# Patient Record
Sex: Female | Born: 1940 | Race: White | Hispanic: No | Marital: Married | Smoking: Never smoker
Health system: Southern US, Community
[De-identification: ages and names within clinical notes are randomized; demographics above are authoritative.]

## PROBLEM LIST (undated history)

## (undated) DIAGNOSIS — M810 Age-related osteoporosis without current pathological fracture: Secondary | ICD-10-CM

## (undated) DIAGNOSIS — R0600 Dyspnea, unspecified: Secondary | ICD-10-CM

## (undated) DIAGNOSIS — K219 Gastro-esophageal reflux disease without esophagitis: Secondary | ICD-10-CM

## (undated) DIAGNOSIS — Z950 Presence of cardiac pacemaker: Secondary | ICD-10-CM

## (undated) DIAGNOSIS — F01B4 Vascular dementia, moderate, with anxiety: Secondary | ICD-10-CM

## (undated) DIAGNOSIS — F329 Major depressive disorder, single episode, unspecified: Secondary | ICD-10-CM

## (undated) DIAGNOSIS — M545 Low back pain, unspecified: Secondary | ICD-10-CM

## (undated) DIAGNOSIS — M5030 Other cervical disc degeneration, unspecified cervical region: Secondary | ICD-10-CM

## (undated) DIAGNOSIS — I6789 Other cerebrovascular disease: Secondary | ICD-10-CM

## (undated) DIAGNOSIS — K2970 Gastritis, unspecified, without bleeding: Secondary | ICD-10-CM

## (undated) DIAGNOSIS — F419 Anxiety disorder, unspecified: Secondary | ICD-10-CM

## (undated) DIAGNOSIS — I779 Disorder of arteries and arterioles, unspecified: Secondary | ICD-10-CM

## (undated) DIAGNOSIS — I5032 Chronic diastolic (congestive) heart failure: Secondary | ICD-10-CM

## (undated) DIAGNOSIS — E1140 Type 2 diabetes mellitus with diabetic neuropathy, unspecified: Secondary | ICD-10-CM

## (undated) DIAGNOSIS — I4891 Unspecified atrial fibrillation: Secondary | ICD-10-CM

## (undated) DIAGNOSIS — K317 Polyp of stomach and duodenum: Secondary | ICD-10-CM

## (undated) DIAGNOSIS — K760 Fatty (change of) liver, not elsewhere classified: Secondary | ICD-10-CM

## (undated) DIAGNOSIS — E039 Hypothyroidism, unspecified: Secondary | ICD-10-CM

## (undated) DIAGNOSIS — I495 Sick sinus syndrome: Secondary | ICD-10-CM

## (undated) DIAGNOSIS — I2510 Atherosclerotic heart disease of native coronary artery without angina pectoris: Secondary | ICD-10-CM

## (undated) DIAGNOSIS — I480 Paroxysmal atrial fibrillation: Secondary | ICD-10-CM

## (undated) DIAGNOSIS — I509 Heart failure, unspecified: Secondary | ICD-10-CM

## (undated) DIAGNOSIS — K56609 Unspecified intestinal obstruction, unspecified as to partial versus complete obstruction: Secondary | ICD-10-CM

## (undated) DIAGNOSIS — N184 Chronic kidney disease, stage 4 (severe): Secondary | ICD-10-CM

## (undated) DIAGNOSIS — I272 Pulmonary hypertension, unspecified: Secondary | ICD-10-CM

## (undated) DIAGNOSIS — I499 Cardiac arrhythmia, unspecified: Secondary | ICD-10-CM

## (undated) DIAGNOSIS — I10 Essential (primary) hypertension: Secondary | ICD-10-CM

## (undated) DIAGNOSIS — F32A Depression, unspecified: Secondary | ICD-10-CM

## (undated) DIAGNOSIS — Z7901 Long term (current) use of anticoagulants: Secondary | ICD-10-CM

## (undated) DIAGNOSIS — I130 Hypertensive heart and chronic kidney disease with heart failure and stage 1 through stage 4 chronic kidney disease, or unspecified chronic kidney disease: Secondary | ICD-10-CM

## (undated) DIAGNOSIS — E119 Type 2 diabetes mellitus without complications: Secondary | ICD-10-CM

## (undated) DIAGNOSIS — N1831 Chronic kidney disease, stage 3a: Secondary | ICD-10-CM

## (undated) DIAGNOSIS — M109 Gout, unspecified: Secondary | ICD-10-CM

## (undated) DIAGNOSIS — S72141A Displaced intertrochanteric fracture of right femur, initial encounter for closed fracture: Secondary | ICD-10-CM

## (undated) DIAGNOSIS — N183 Chronic kidney disease, stage 3 (moderate): Secondary | ICD-10-CM

## (undated) DIAGNOSIS — K922 Gastrointestinal hemorrhage, unspecified: Secondary | ICD-10-CM

## (undated) DIAGNOSIS — G709 Myoneural disorder, unspecified: Secondary | ICD-10-CM

## (undated) DIAGNOSIS — I639 Cerebral infarction, unspecified: Secondary | ICD-10-CM

## (undated) DIAGNOSIS — Z79899 Other long term (current) drug therapy: Secondary | ICD-10-CM

## (undated) DIAGNOSIS — M1990 Unspecified osteoarthritis, unspecified site: Secondary | ICD-10-CM

## (undated) DIAGNOSIS — I700 Atherosclerosis of aorta: Secondary | ICD-10-CM

## (undated) DIAGNOSIS — G459 Transient cerebral ischemic attack, unspecified: Secondary | ICD-10-CM

## (undated) DIAGNOSIS — K635 Polyp of colon: Secondary | ICD-10-CM

## (undated) DIAGNOSIS — D649 Anemia, unspecified: Secondary | ICD-10-CM

## (undated) HISTORY — DX: Sick sinus syndrome: I49.5

## (undated) HISTORY — PX: TUBAL LIGATION: SHX77

## (undated) HISTORY — PX: CHOLECYSTECTOMY: SHX55

## (undated) HISTORY — PX: BREAST EXCISIONAL BIOPSY: SUR124

## (undated) HISTORY — PX: REPLACEMENT TOTAL KNEE BILATERAL: SUR1225

## (undated) HISTORY — DX: Age-related osteoporosis without current pathological fracture: M81.0

## (undated) HISTORY — PX: APPENDECTOMY: SHX54

## (undated) HISTORY — DX: Gout, unspecified: M10.9

## (undated) HISTORY — PX: EYE SURGERY: SHX253

## (undated) HISTORY — PX: BACK SURGERY: SHX140

## (undated) HISTORY — DX: Unspecified atrial fibrillation: I48.91

## (undated) HISTORY — PX: OOPHORECTOMY: SHX86

---
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
## 1978-09-12 HISTORY — PX: ABDOMINAL HYSTERECTOMY: SHX81

## 1997-09-12 HISTORY — PX: COLON SURGERY: SHX602

## 1997-09-12 HISTORY — PX: OTHER SURGICAL HISTORY: SHX169

## 1998-09-12 HISTORY — PX: COLON SURGERY: SHX602

## 2007-10-20 ENCOUNTER — Other Ambulatory Visit: Payer: Self-pay

## 2007-10-20 ENCOUNTER — Emergency Department: Payer: Self-pay | Admitting: Emergency Medicine

## 2008-01-19 ENCOUNTER — Emergency Department: Payer: Self-pay | Admitting: Emergency Medicine

## 2008-01-21 ENCOUNTER — Emergency Department: Payer: Self-pay | Admitting: Emergency Medicine

## 2008-01-27 ENCOUNTER — Inpatient Hospital Stay: Payer: Self-pay | Admitting: Internal Medicine

## 2008-04-06 ENCOUNTER — Emergency Department: Payer: Self-pay | Admitting: Emergency Medicine

## 2008-05-16 ENCOUNTER — Emergency Department: Payer: Self-pay | Admitting: Emergency Medicine

## 2008-05-17 ENCOUNTER — Inpatient Hospital Stay: Payer: Self-pay | Admitting: Internal Medicine

## 2008-05-17 ENCOUNTER — Other Ambulatory Visit: Payer: Self-pay

## 2008-07-01 ENCOUNTER — Observation Stay: Payer: Self-pay | Admitting: Internal Medicine

## 2008-07-10 ENCOUNTER — Encounter: Payer: Self-pay | Admitting: Internal Medicine

## 2008-07-13 ENCOUNTER — Encounter: Payer: Self-pay | Admitting: Internal Medicine

## 2008-08-12 ENCOUNTER — Encounter: Payer: Self-pay | Admitting: Internal Medicine

## 2008-08-27 ENCOUNTER — Ambulatory Visit: Payer: Self-pay | Admitting: Urology

## 2008-09-12 ENCOUNTER — Encounter: Payer: Self-pay | Admitting: Internal Medicine

## 2008-09-16 ENCOUNTER — Ambulatory Visit: Payer: Self-pay | Admitting: Internal Medicine

## 2008-12-31 ENCOUNTER — Encounter: Payer: Self-pay | Admitting: Internal Medicine

## 2009-01-10 ENCOUNTER — Encounter: Payer: Self-pay | Admitting: Internal Medicine

## 2009-03-03 ENCOUNTER — Ambulatory Visit: Payer: Self-pay | Admitting: Gastroenterology

## 2009-06-11 ENCOUNTER — Emergency Department: Payer: Self-pay | Admitting: Emergency Medicine

## 2009-10-01 ENCOUNTER — Ambulatory Visit: Payer: Self-pay | Admitting: Internal Medicine

## 2010-01-19 ENCOUNTER — Ambulatory Visit: Payer: Self-pay

## 2010-01-27 ENCOUNTER — Encounter: Admission: RE | Admit: 2010-01-27 | Discharge: 2010-01-27 | Payer: Self-pay | Admitting: Orthopedic Surgery

## 2010-02-11 ENCOUNTER — Encounter: Admission: RE | Admit: 2010-02-11 | Discharge: 2010-02-11 | Payer: Self-pay | Admitting: Orthopedic Surgery

## 2010-03-12 ENCOUNTER — Encounter: Admission: RE | Admit: 2010-03-12 | Discharge: 2010-03-12 | Payer: Self-pay | Admitting: Orthopedic Surgery

## 2010-04-26 ENCOUNTER — Ambulatory Visit: Payer: Self-pay | Admitting: Internal Medicine

## 2010-05-22 ENCOUNTER — Emergency Department: Payer: Self-pay | Admitting: Emergency Medicine

## 2010-06-17 ENCOUNTER — Ambulatory Visit: Payer: Self-pay

## 2010-09-12 HISTORY — PX: CATARACT EXTRACTION, BILATERAL: SHX1313

## 2010-10-13 ENCOUNTER — Ambulatory Visit: Payer: Self-pay | Admitting: Internal Medicine

## 2010-10-15 ENCOUNTER — Ambulatory Visit: Payer: Self-pay | Admitting: Internal Medicine

## 2010-10-18 ENCOUNTER — Ambulatory Visit: Payer: Self-pay | Admitting: General Practice

## 2010-11-03 ENCOUNTER — Inpatient Hospital Stay: Payer: Self-pay | Admitting: General Practice

## 2010-11-08 ENCOUNTER — Encounter: Payer: Self-pay | Admitting: Internal Medicine

## 2010-11-11 ENCOUNTER — Encounter: Payer: Self-pay | Admitting: Internal Medicine

## 2010-12-07 ENCOUNTER — Encounter: Payer: Self-pay | Admitting: General Practice

## 2010-12-12 ENCOUNTER — Encounter: Payer: Self-pay | Admitting: General Practice

## 2011-05-09 ENCOUNTER — Ambulatory Visit: Payer: Self-pay | Admitting: Internal Medicine

## 2011-09-13 HISTORY — PX: SHOULDER ARTHROSCOPY WITH SUBACROMIAL DECOMPRESSION: SHX5684

## 2012-02-03 ENCOUNTER — Ambulatory Visit: Payer: Self-pay | Admitting: General Practice

## 2012-03-08 ENCOUNTER — Encounter: Payer: Self-pay | Admitting: General Practice

## 2012-03-12 ENCOUNTER — Ambulatory Visit: Payer: Self-pay | Admitting: Internal Medicine

## 2012-03-12 ENCOUNTER — Encounter: Payer: Self-pay | Admitting: General Practice

## 2012-03-28 ENCOUNTER — Ambulatory Visit: Payer: Self-pay | Admitting: General Practice

## 2012-03-28 LAB — BASIC METABOLIC PANEL
BUN: 19 mg/dL — ABNORMAL HIGH (ref 7–18)
Chloride: 108 mmol/L — ABNORMAL HIGH (ref 98–107)
EGFR (African American): 51 — ABNORMAL LOW
EGFR (Non-African Amer.): 44 — ABNORMAL LOW
Osmolality: 288 (ref 275–301)
Potassium: 4.2 mmol/L (ref 3.5–5.1)
Sodium: 143 mmol/L (ref 136–145)

## 2012-03-28 LAB — CBC WITH DIFFERENTIAL/PLATELET
Basophil %: 0.8 %
Eosinophil %: 2.3 %
HGB: 13 g/dL (ref 12.0–16.0)
Lymphocyte #: 2.5 10*3/uL (ref 1.0–3.6)
MCH: 30.1 pg (ref 26.0–34.0)
MCV: 94 fL (ref 80–100)
Monocyte #: 0.5 x10 3/mm (ref 0.2–0.9)
Neutrophil %: 50.2 %
RBC: 4.33 10*6/uL (ref 3.80–5.20)

## 2012-04-02 ENCOUNTER — Ambulatory Visit: Payer: Self-pay | Admitting: General Practice

## 2012-04-11 DIAGNOSIS — H538 Other visual disturbances: Secondary | ICD-10-CM | POA: Insufficient documentation

## 2012-04-12 ENCOUNTER — Encounter: Payer: Self-pay | Admitting: General Practice

## 2012-07-16 ENCOUNTER — Inpatient Hospital Stay: Payer: Self-pay | Admitting: Internal Medicine

## 2012-07-16 LAB — CBC
HCT: 43.2 % (ref 35.0–47.0)
HGB: 14.8 g/dL (ref 12.0–16.0)
MCH: 31.3 pg (ref 26.0–34.0)
MCHC: 34.2 g/dL (ref 32.0–36.0)
RBC: 4.71 10*6/uL (ref 3.80–5.20)
RDW: 13.1 % (ref 11.5–14.5)

## 2012-07-16 LAB — COMPREHENSIVE METABOLIC PANEL
Albumin: 3.6 g/dL (ref 3.4–5.0)
Alkaline Phosphatase: 109 U/L (ref 50–136)
BUN: 20 mg/dL — ABNORMAL HIGH (ref 7–18)
Calcium, Total: 8.8 mg/dL (ref 8.5–10.1)
Co2: 25 mmol/L (ref 21–32)
EGFR (Non-African Amer.): 45 — ABNORMAL LOW
Glucose: 71 mg/dL (ref 65–99)
Osmolality: 280 (ref 275–301)
Potassium: 4 mmol/L (ref 3.5–5.1)
SGOT(AST): 23 U/L (ref 15–37)
SGPT (ALT): 23 U/L (ref 12–78)
Sodium: 140 mmol/L (ref 136–145)

## 2012-07-16 LAB — APTT
Activated PTT: 28.7 secs (ref 23.6–35.9)
Activated PTT: >160 secs (ref 23.6–35.9)
Activated PTT: 130.2 secs — ABNORMAL HIGH (ref 23.6–35.9)

## 2012-07-16 LAB — TSH: Thyroid Stimulating Horm: 3.99 u[IU]/mL

## 2012-07-16 LAB — DIGOXIN LEVEL: Digoxin: <0.1 ng/mL — ABNORMAL LOW

## 2012-07-16 LAB — TROPONIN I: Troponin-I: <0.02 ng/mL

## 2012-07-16 LAB — CK TOTAL AND CKMB (NOT AT ARMC): CK-MB: 0.9 ng/mL (ref 0.5–3.6)

## 2012-07-17 LAB — URINALYSIS, COMPLETE
Bacteria: NONE SEEN
Bilirubin,UR: NEGATIVE
Glucose,UR: NEGATIVE mg/dL (ref 0–75)
Ketone: NEGATIVE
Nitrite: POSITIVE
Specific Gravity: 1.006 (ref 1.003–1.030)
Squamous Epithelial: 1
WBC UR: 12 /HPF (ref 0–5)

## 2012-07-17 LAB — APTT: Activated PTT: 93.6 secs — ABNORMAL HIGH (ref 23.6–35.9)

## 2012-07-18 LAB — CBC WITH DIFFERENTIAL/PLATELET
Basophil #: 0.1 10*3/uL (ref 0.0–0.1)
Basophil %: 0.8 %
Eosinophil #: 0.1 10*3/uL (ref 0.0–0.7)
HGB: 14.5 g/dL (ref 12.0–16.0)
Lymphocyte %: 16 %
MCH: 30.5 pg (ref 26.0–34.0)
MCHC: 33.2 g/dL (ref 32.0–36.0)
Monocyte #: 0.8 x10 3/mm (ref 0.2–0.9)
Neutrophil %: 73.8 %
RDW: 13.1 % (ref 11.5–14.5)

## 2012-07-18 LAB — BASIC METABOLIC PANEL
Anion Gap: 6 — ABNORMAL LOW (ref 7–16)
BUN: 24 mg/dL — ABNORMAL HIGH (ref 7–18)
Chloride: 99 mmol/L (ref 98–107)
Creatinine: 1.33 mg/dL — ABNORMAL HIGH (ref 0.60–1.30)
Glucose: 300 mg/dL — ABNORMAL HIGH (ref 65–99)
Potassium: 5.1 mmol/L (ref 3.5–5.1)
Sodium: 132 mmol/L — ABNORMAL LOW (ref 136–145)

## 2012-07-19 LAB — CBC WITH DIFFERENTIAL/PLATELET
Basophil #: 0 10*3/uL (ref 0.0–0.1)
Eosinophil #: 0.3 10*3/uL (ref 0.0–0.7)
HCT: 42.3 % (ref 35.0–47.0)
Lymphocyte #: 1.8 10*3/uL (ref 1.0–3.6)
Lymphocyte %: 22.3 %
MCHC: 34.7 g/dL (ref 32.0–36.0)
MCV: 93 fL (ref 80–100)
Neutrophil #: 5.1 10*3/uL (ref 1.4–6.5)
Platelet: 235 10*3/uL (ref 150–440)
RDW: 13 % (ref 11.5–14.5)

## 2012-07-19 LAB — DIGOXIN LEVEL: Digoxin: 1.12 ng/mL

## 2012-07-20 LAB — BASIC METABOLIC PANEL
Calcium, Total: 8 mg/dL — ABNORMAL LOW (ref 8.5–10.1)
Chloride: 105 mmol/L (ref 98–107)
Co2: 24 mmol/L (ref 21–32)
EGFR (African American): >60
EGFR (Non-African Amer.): 57 — ABNORMAL LOW
Glucose: 116 mg/dL — ABNORMAL HIGH (ref 65–99)
Osmolality: 278 (ref 275–301)
Potassium: 4.9 mmol/L (ref 3.5–5.1)
Sodium: 137 mmol/L (ref 136–145)

## 2012-09-12 HISTORY — PX: JOINT REPLACEMENT: SHX530

## 2012-09-26 ENCOUNTER — Ambulatory Visit: Payer: Self-pay | Admitting: General Practice

## 2012-09-26 LAB — BASIC METABOLIC PANEL
Anion Gap: 8 (ref 7–16)
EGFR (African American): 53 — ABNORMAL LOW
EGFR (Non-African Amer.): 46 — ABNORMAL LOW
Sodium: 141 mmol/L (ref 136–145)

## 2012-09-26 LAB — URINALYSIS, COMPLETE
Bilirubin,UR: NEGATIVE
Glucose,UR: NEGATIVE mg/dL (ref 0–75)
Ketone: NEGATIVE
Nitrite: NEGATIVE
RBC,UR: 5 /HPF (ref 0–5)

## 2012-09-26 LAB — PROTIME-INR
INR: 1.3
Prothrombin Time: 16.3 secs — ABNORMAL HIGH (ref 11.5–14.7)

## 2012-09-26 LAB — CBC
MCHC: 32.6 g/dL (ref 32.0–36.0)
Platelet: 250 10*3/uL (ref 150–440)
WBC: 6.2 10*3/uL (ref 3.6–11.0)

## 2012-09-26 LAB — APTT: Activated PTT: 31.5 secs (ref 23.6–35.9)

## 2012-10-10 ENCOUNTER — Inpatient Hospital Stay: Payer: Self-pay | Admitting: General Practice

## 2012-10-10 LAB — PROTIME-INR: INR: 0.9

## 2012-10-11 LAB — BASIC METABOLIC PANEL
Co2: 26 mmol/L (ref 21–32)
EGFR (Non-African Amer.): 43 — ABNORMAL LOW
Osmolality: 286 (ref 275–301)

## 2012-10-11 LAB — HEMOGLOBIN: HGB: 10.9 g/dL — ABNORMAL LOW (ref 12.0–16.0)

## 2012-10-12 ENCOUNTER — Encounter: Payer: Self-pay | Admitting: Internal Medicine

## 2012-10-12 LAB — BASIC METABOLIC PANEL
BUN: 19 mg/dL — ABNORMAL HIGH (ref 7–18)
Calcium, Total: 7.8 mg/dL — ABNORMAL LOW (ref 8.5–10.1)
Chloride: 105 mmol/L (ref 98–107)
Chloride: 106 mmol/L (ref 98–107)
Co2: 18 mmol/L — ABNORMAL LOW (ref 21–32)
Co2: 26 mmol/L (ref 21–32)
Creatinine: 1.5 mg/dL — ABNORMAL HIGH (ref 0.60–1.30)
EGFR (Non-African Amer.): 38 — ABNORMAL LOW
EGFR (Non-African Amer.): 34 — ABNORMAL LOW
Osmolality: 289 (ref 275–301)
Sodium: 133 mmol/L — ABNORMAL LOW (ref 136–145)

## 2012-10-12 LAB — HEMOGLOBIN: HGB: 11 g/dL — ABNORMAL LOW (ref 12.0–16.0)

## 2012-10-13 LAB — BASIC METABOLIC PANEL
BUN: 17 mg/dL (ref 7–18)
Calcium, Total: 8.6 mg/dL (ref 8.5–10.1)
Co2: 25 mmol/L (ref 21–32)
Sodium: 137 mmol/L (ref 136–145)

## 2012-10-14 ENCOUNTER — Encounter: Payer: Self-pay | Admitting: Internal Medicine

## 2012-10-16 LAB — PROTIME-INR: INR: 1.5

## 2012-10-29 ENCOUNTER — Emergency Department: Payer: Self-pay | Admitting: Emergency Medicine

## 2012-10-29 LAB — COMPREHENSIVE METABOLIC PANEL
Albumin: 3.9 g/dL (ref 3.4–5.0)
Anion Gap: 12 (ref 7–16)
Chloride: 104 mmol/L (ref 98–107)
Co2: 21 mmol/L (ref 21–32)
Osmolality: 277 (ref 275–301)
Potassium: 4.8 mmol/L (ref 3.5–5.1)
SGOT(AST): 47 U/L — ABNORMAL HIGH (ref 15–37)
SGPT (ALT): 27 U/L (ref 12–78)
Sodium: 137 mmol/L (ref 136–145)
Total Protein: 8 g/dL (ref 6.4–8.2)

## 2012-10-29 LAB — URINALYSIS, COMPLETE
Bilirubin,UR: NEGATIVE
Blood: NEGATIVE
Glucose,UR: NEGATIVE mg/dL (ref 0–75)
Leukocyte Esterase: NEGATIVE
Ph: 9 (ref 4.5–8.0)
Protein: NEGATIVE
RBC,UR: <1 /HPF (ref 0–5)
Squamous Epithelial: 4

## 2012-10-29 LAB — CBC
HCT: 38.4 % (ref 35.0–47.0)
HGB: 12.9 g/dL (ref 12.0–16.0)
MCH: 30.9 pg (ref 26.0–34.0)
MCHC: 33.6 g/dL (ref 32.0–36.0)
Platelet: 493 10*3/uL — ABNORMAL HIGH (ref 150–440)
RDW: 14 % (ref 11.5–14.5)
WBC: 10.1 10*3/uL (ref 3.6–11.0)

## 2012-11-05 ENCOUNTER — Encounter: Payer: Self-pay | Admitting: General Practice

## 2012-11-10 ENCOUNTER — Encounter: Payer: Self-pay | Admitting: General Practice

## 2012-12-11 ENCOUNTER — Encounter: Payer: Self-pay | Admitting: General Practice

## 2013-04-01 ENCOUNTER — Ambulatory Visit: Payer: Self-pay | Admitting: Internal Medicine

## 2013-06-25 DIAGNOSIS — R351 Nocturia: Secondary | ICD-10-CM | POA: Insufficient documentation

## 2013-06-25 DIAGNOSIS — N3946 Mixed incontinence: Secondary | ICD-10-CM | POA: Insufficient documentation

## 2013-12-23 ENCOUNTER — Emergency Department: Payer: Self-pay | Admitting: Emergency Medicine

## 2013-12-23 LAB — PROTIME-INR
INR: 3.1
Prothrombin Time: 31 secs — ABNORMAL HIGH (ref 11.5–14.7)

## 2013-12-27 ENCOUNTER — Ambulatory Visit: Payer: Self-pay

## 2014-02-10 ENCOUNTER — Ambulatory Visit: Payer: Self-pay | Admitting: Gastroenterology

## 2014-02-10 LAB — PROTIME-INR
INR: 1.2
Prothrombin Time: 15.3 secs — ABNORMAL HIGH (ref 11.5–14.7)

## 2014-02-12 LAB — PATHOLOGY REPORT

## 2014-04-02 ENCOUNTER — Ambulatory Visit: Payer: Self-pay | Admitting: Internal Medicine

## 2014-04-03 ENCOUNTER — Ambulatory Visit: Payer: Self-pay | Admitting: Internal Medicine

## 2014-06-21 DIAGNOSIS — Z6841 Body Mass Index (BMI) 40.0 and over, adult: Secondary | ICD-10-CM | POA: Insufficient documentation

## 2014-07-07 ENCOUNTER — Observation Stay: Payer: Self-pay | Admitting: Internal Medicine

## 2014-07-07 LAB — CBC
HCT: 31.9 % — ABNORMAL LOW (ref 35.0–47.0)
HGB: 9.9 g/dL — ABNORMAL LOW (ref 12.0–16.0)
MCH: 26.6 pg (ref 26.0–34.0)
MCHC: 31.1 g/dL — ABNORMAL LOW (ref 32.0–36.0)
MCV: 86 fL (ref 80–100)
PLATELETS: 287 10*3/uL (ref 150–440)
RBC: 3.72 10*6/uL — AB (ref 3.80–5.20)
RDW: 14.1 % (ref 11.5–14.5)
WBC: 8.1 10*3/uL (ref 3.6–11.0)

## 2014-07-07 LAB — PROTIME-INR
INR: 2.1
Prothrombin Time: 23.3 secs — ABNORMAL HIGH (ref 11.5–14.7)

## 2014-07-07 LAB — BASIC METABOLIC PANEL
Anion Gap: 9 (ref 7–16)
BUN: 30 mg/dL — ABNORMAL HIGH (ref 7–18)
CHLORIDE: 108 mmol/L — AB (ref 98–107)
CO2: 27 mmol/L (ref 21–32)
Calcium, Total: 7.9 mg/dL — ABNORMAL LOW (ref 8.5–10.1)
Creatinine: 1.48 mg/dL — ABNORMAL HIGH (ref 0.60–1.30)
EGFR (African American): 45 — ABNORMAL LOW
EGFR (Non-African Amer.): 37 — ABNORMAL LOW
GLUCOSE: 216 mg/dL — AB (ref 65–99)
OSMOLALITY: 300 (ref 275–301)
Potassium: 3.8 mmol/L (ref 3.5–5.1)
Sodium: 144 mmol/L (ref 136–145)

## 2014-07-07 LAB — TROPONIN I
Troponin-I: <0.02 ng/mL
Troponin-I: <0.02 ng/mL

## 2014-07-09 LAB — PROTIME-INR
INR: 2.1
PROTHROMBIN TIME: 23.4 s — AB (ref 11.5–14.7)

## 2014-07-15 DIAGNOSIS — I6523 Occlusion and stenosis of bilateral carotid arteries: Secondary | ICD-10-CM | POA: Insufficient documentation

## 2014-07-15 DIAGNOSIS — I10 Essential (primary) hypertension: Secondary | ICD-10-CM | POA: Insufficient documentation

## 2014-07-15 DIAGNOSIS — I779 Disorder of arteries and arterioles, unspecified: Secondary | ICD-10-CM | POA: Insufficient documentation

## 2014-07-15 DIAGNOSIS — E782 Mixed hyperlipidemia: Secondary | ICD-10-CM | POA: Insufficient documentation

## 2014-08-11 DIAGNOSIS — R001 Bradycardia, unspecified: Secondary | ICD-10-CM | POA: Insufficient documentation

## 2014-08-19 ENCOUNTER — Inpatient Hospital Stay: Payer: Self-pay | Admitting: Internal Medicine

## 2014-08-19 LAB — CBC WITH DIFFERENTIAL/PLATELET
Basophil #: 0.2 10*3/uL — ABNORMAL HIGH (ref 0.0–0.1)
Basophil %: 1.1 %
Eosinophil #: 0.3 10*3/uL (ref 0.0–0.7)
Eosinophil %: 2.4 %
HCT: 36.6 % (ref 35.0–47.0)
HGB: 11.4 g/dL — ABNORMAL LOW (ref 12.0–16.0)
Lymphocyte #: 5.5 10*3/uL — ABNORMAL HIGH (ref 1.0–3.6)
Lymphocyte %: 41.1 %
MCH: 26.1 pg (ref 26.0–34.0)
MCHC: 31.2 g/dL — AB (ref 32.0–36.0)
MCV: 84 fL (ref 80–100)
MONO ABS: 1.3 x10 3/mm — AB (ref 0.2–0.9)
Monocyte %: 9.7 %
NEUTROS PCT: 45.7 %
Neutrophil #: 6.1 10*3/uL (ref 1.4–6.5)
PLATELETS: 304 10*3/uL (ref 150–440)
RBC: 4.37 10*6/uL (ref 3.80–5.20)
RDW: 16.7 % — ABNORMAL HIGH (ref 11.5–14.5)
WBC: 13.4 10*3/uL — ABNORMAL HIGH (ref 3.6–11.0)

## 2014-08-19 LAB — BASIC METABOLIC PANEL
Anion Gap: 7 (ref 7–16)
BUN: 21 mg/dL — AB (ref 7–18)
CHLORIDE: 106 mmol/L (ref 98–107)
CO2: 26 mmol/L (ref 21–32)
Calcium, Total: 8.3 mg/dL — ABNORMAL LOW (ref 8.5–10.1)
Creatinine: 1.24 mg/dL (ref 0.60–1.30)
EGFR (African American): 55 — ABNORMAL LOW
EGFR (Non-African Amer.): 45 — ABNORMAL LOW
Glucose: 101 mg/dL — ABNORMAL HIGH (ref 65–99)
Osmolality: 281 (ref 275–301)
Potassium: 5.3 mmol/L — ABNORMAL HIGH (ref 3.5–5.1)
Sodium: 139 mmol/L (ref 136–145)

## 2014-08-19 LAB — PROTIME-INR
INR: 3.3
PROTHROMBIN TIME: 32.3 s — AB (ref 11.5–14.7)

## 2014-08-20 LAB — PROTIME-INR
INR: 2.4
PROTHROMBIN TIME: 25.8 s — AB (ref 11.5–14.7)

## 2014-08-21 ENCOUNTER — Emergency Department: Payer: Self-pay | Admitting: Emergency Medicine

## 2014-08-21 LAB — BASIC METABOLIC PANEL
ANION GAP: 7 (ref 7–16)
BUN: 33 mg/dL — ABNORMAL HIGH (ref 7–18)
CO2: 28 mmol/L (ref 21–32)
Calcium, Total: 8.4 mg/dL — ABNORMAL LOW (ref 8.5–10.1)
Chloride: 104 mmol/L (ref 98–107)
Creatinine: 1.5 mg/dL — ABNORMAL HIGH (ref 0.60–1.30)
EGFR (African American): 44 — ABNORMAL LOW
GFR CALC NON AF AMER: 36 — AB
Glucose: 154 mg/dL — ABNORMAL HIGH (ref 65–99)
Osmolality: 288 (ref 275–301)
Potassium: 4.4 mmol/L (ref 3.5–5.1)
Sodium: 139 mmol/L (ref 136–145)

## 2014-08-21 LAB — CBC WITH DIFFERENTIAL/PLATELET
BASOS ABS: 0.1 10*3/uL (ref 0.0–0.1)
BASOS PCT: 0.6 %
EOS PCT: 2 %
Eosinophil #: 0.2 10*3/uL (ref 0.0–0.7)
HCT: 33.5 % — ABNORMAL LOW (ref 35.0–47.0)
HGB: 10.4 g/dL — ABNORMAL LOW (ref 12.0–16.0)
LYMPHS ABS: 1.7 10*3/uL (ref 1.0–3.6)
Lymphocyte %: 21 %
MCH: 26 pg (ref 26.0–34.0)
MCHC: 31.1 g/dL — ABNORMAL LOW (ref 32.0–36.0)
MCV: 84 fL (ref 80–100)
MONO ABS: 0.7 x10 3/mm (ref 0.2–0.9)
MONOS PCT: 8.9 %
NEUTROS ABS: 5.6 10*3/uL (ref 1.4–6.5)
NEUTROS PCT: 67.5 %
Platelet: 318 10*3/uL (ref 150–440)
RBC: 4 10*6/uL (ref 3.80–5.20)
RDW: 16.5 % — AB (ref 11.5–14.5)
WBC: 8.3 10*3/uL (ref 3.6–11.0)

## 2014-08-21 LAB — PROTIME-INR
INR: 1.6
Prothrombin Time: 18.6 secs — ABNORMAL HIGH (ref 11.5–14.7)

## 2014-09-12 DIAGNOSIS — I480 Paroxysmal atrial fibrillation: Secondary | ICD-10-CM

## 2014-09-12 DIAGNOSIS — I4891 Unspecified atrial fibrillation: Secondary | ICD-10-CM

## 2014-09-12 HISTORY — PX: COLONOSCOPY: SHX174

## 2014-09-12 HISTORY — DX: Unspecified atrial fibrillation: I48.91

## 2014-09-12 HISTORY — DX: Paroxysmal atrial fibrillation: I48.0

## 2014-09-13 ENCOUNTER — Ambulatory Visit: Payer: Self-pay | Admitting: Orthopedic Surgery

## 2014-09-15 ENCOUNTER — Ambulatory Visit: Payer: Self-pay | Admitting: Orthopedic Surgery

## 2014-09-17 DIAGNOSIS — M5416 Radiculopathy, lumbar region: Secondary | ICD-10-CM | POA: Insufficient documentation

## 2014-09-17 DIAGNOSIS — M5116 Intervertebral disc disorders with radiculopathy, lumbar region: Secondary | ICD-10-CM | POA: Insufficient documentation

## 2014-09-17 DIAGNOSIS — M48062 Spinal stenosis, lumbar region with neurogenic claudication: Secondary | ICD-10-CM | POA: Insufficient documentation

## 2014-09-30 ENCOUNTER — Ambulatory Visit: Payer: Self-pay | Admitting: Physical Medicine and Rehabilitation

## 2014-10-14 ENCOUNTER — Encounter: Payer: Self-pay | Admitting: Physical Medicine and Rehabilitation

## 2014-10-16 ENCOUNTER — Ambulatory Visit: Payer: Self-pay | Admitting: General Practice

## 2014-10-16 DIAGNOSIS — M75122 Complete rotator cuff tear or rupture of left shoulder, not specified as traumatic: Secondary | ICD-10-CM | POA: Insufficient documentation

## 2014-10-16 HISTORY — DX: Complete rotator cuff tear or rupture of left shoulder, not specified as traumatic: M75.122

## 2014-10-16 LAB — BASIC METABOLIC PANEL
Anion Gap: 6 — ABNORMAL LOW (ref 7–16)
BUN: 25 mg/dL — ABNORMAL HIGH (ref 7–18)
CO2: 27 mmol/L (ref 21–32)
Calcium, Total: 8.5 mg/dL (ref 8.5–10.1)
Chloride: 108 mmol/L — ABNORMAL HIGH (ref 98–107)
Creatinine: 1.35 mg/dL — ABNORMAL HIGH (ref 0.60–1.30)
EGFR (Non-African Amer.): 41 — ABNORMAL LOW
GFR CALC AF AMER: 49 — AB
Glucose: 97 mg/dL (ref 65–99)
Osmolality: 286 (ref 275–301)
POTASSIUM: 4.6 mmol/L (ref 3.5–5.1)
SODIUM: 141 mmol/L (ref 136–145)

## 2014-10-16 LAB — CBC WITH DIFFERENTIAL/PLATELET
BASOS PCT: 2.2 %
Basophil #: 0.1 10*3/uL (ref 0.0–0.1)
EOS ABS: 0.2 10*3/uL (ref 0.0–0.7)
Eosinophil %: 2.9 %
HCT: 33.4 % — ABNORMAL LOW (ref 35.0–47.0)
HGB: 10.3 g/dL — ABNORMAL LOW (ref 12.0–16.0)
LYMPHS ABS: 1.9 10*3/uL (ref 1.0–3.6)
Lymphocyte %: 28.6 %
MCH: 25.1 pg — ABNORMAL LOW (ref 26.0–34.0)
MCHC: 30.8 g/dL — AB (ref 32.0–36.0)
MCV: 82 fL (ref 80–100)
MONO ABS: 0.6 x10 3/mm (ref 0.2–0.9)
Monocyte %: 9.9 %
Neutrophil #: 3.7 10*3/uL (ref 1.4–6.5)
Neutrophil %: 56.4 %
PLATELETS: 252 10*3/uL (ref 150–440)
RBC: 4.09 10*6/uL (ref 3.80–5.20)
RDW: 17.9 % — AB (ref 11.5–14.5)
WBC: 6.5 10*3/uL (ref 3.6–11.0)

## 2014-10-27 ENCOUNTER — Ambulatory Visit: Payer: Self-pay | Admitting: General Practice

## 2014-11-04 DIAGNOSIS — Z9889 Other specified postprocedural states: Secondary | ICD-10-CM | POA: Insufficient documentation

## 2014-11-11 ENCOUNTER — Encounter
Admit: 2014-11-11 | Disposition: A | Discharge status: Home / Self Care | Payer: Self-pay | Attending: Physician Assistant | Admitting: Physician Assistant

## 2014-11-22 ENCOUNTER — Emergency Department: Payer: Self-pay | Admitting: Emergency Medicine

## 2014-12-12 ENCOUNTER — Encounter
Admit: 2014-12-12 | Disposition: A | Discharge status: Home / Self Care | Payer: Self-pay | Attending: Physician Assistant | Admitting: Physician Assistant

## 2014-12-17 ENCOUNTER — Ambulatory Visit
Admit: 2014-12-17 | Disposition: A | Discharge status: Home / Self Care | Payer: Self-pay | Attending: Gastroenterology | Admitting: Gastroenterology

## 2014-12-22 ENCOUNTER — Ambulatory Visit: Admit: 2014-12-22 | Disposition: A | Discharge status: Home / Self Care | Payer: Self-pay | Admitting: Gastroenterology

## 2014-12-30 NOTE — Consult Note (Signed)
PATIENT NAME:  Deborah Obrien, Deborah Obrien MR#:  [id number] DATE OF BIRTH:  1940-10-09  DATE OF CONSULTATION:  07/16/2012  REFERRING PHYSICIAN:  Frazier Richards, MD CONSULTING PHYSICIAN:  Corey Skains, MD  PRIMARY CARE PHYSICIAN: Frazier Richards, MD  REASON FOR CONSULTATION: Atrial flutter with rapid ventricular rate, diabetes, hypertension, sleep apnea, and chronic kidney disease.   CHIEF COMPLAINT: "I got fluttering and short of breath."   HISTORY OF PRESENT ILLNESS: This is a 74 year old female with known hypertension, borderline sleep apnea, chronic kidney disease, and diabetes who has had reasonable control of sleep apnea although has not been using any treatment with a CPAP machine. The patient has had no evidence of recent deoxygenation in the office. The patient does have good diabetes control with reasonable Hemoglobin A1c, although the patient has had chronic kidney disease which is secondary to the diabetes at this time. The patient has had some borderline hypertension but not requiring additional medications. Recently she has had new onset of shortness of breath and weakness with palpitations and rapid heartbeat. She was seen in the Emergency Room with an EKG showing atrial flutter with rapid ventricular rate. She was placed on a Cardizem drip and the patient has had some improvements of her heart rate control, although not down to appropriate ranges. The patient has had no evidence of heart failure or angina or myocardial infarction at this time.   REVIEW OF SYSTEMS: The remainder review of systems is negative for vision change, ringing in the ears, hearing loss, cough, congestion, heartburn, nausea, vomiting, diarrhea, bloody stool, abdominal pain, weakness, fatigue, syncope, dizziness, nausea, diaphoresis, skin rashes, or skin lesions.   PAST MEDICAL HISTORY:  1. Sleep apnea.  2. Atrial flutter diabetes.  3. Diabetes. 4. Chronic kidney disease.   FAMILY HISTORY: Mother and father  had myocardial infarction at an early age.   SOCIAL HISTORY: The patient currently denies  tobacco or alcohol use.  ALLERGIES: No known drug allergies.   CURRENT MEDICATIONS: As listed.   PHYSICAL EXAMINATION:   VITAL SIGNS: Blood pressure is 126/68 bilaterally and heart rate 139 upright and  reclining and irregular.   GENERAL: She is a well appearing female in no acute distress.   HEENT: No icterus, ulcers, hemorrhage, or xanthelasma.   HEART: Irregular, irregular with normal S1 and S2 without murmur, gallop, or rub. Point of maximal impulse is diffuse. Carotid upstroke is normal without bruit. Jugular venous pressure is normal.   LUNGS: Lungs have few basilar crackles with few wheezes.  ABDOMEN: Soft and nontender without hepatosplenomegaly or masses. Abdominal aorta is normal size without bruit.   EXTREMITIES: 2+ radial, femoral, dorsal, and pedal pulses with no evidence of significant lower extremity edema, cyanosis, clubbing, or ulcers.   NEUROLOGIC: She is oriented to time, place, and person with normal mood and affect.   ASSESSMENT: This is a 74 year old female with sleep apnea, diabetes, chest pain, and chronic kidney disease with new onset atrial flutter with rapid ventricular rate slightly improved with medical management including intravenous Cardizem.   RECOMMENDATIONS:  1. Continue Cardizem drip for heart rate control and add oral Cardizem for better heart rate control this afternoon. 2. Anticoagulation for further risk reduction and stroke with atrial flutter. The patient understands the risks and benefits of this anticoagulation.  3. Echocardiogram for LV systolic dysfunction and valvular heart disease contributing to above.  4. Further treatment of diabetes with goal Hemoglobin A1c below 7. 5. Further treatment of possible sleep apnea with overnight oxygen assessment.  6. Further consideration of the possibility of electrical cardioversion of atrial flutter to normal  sinus rhythm. The patient understands the risks and benefits of this potential procedure and these include the possibility of death, stroke, heart attack, or other rhythm disturbances.  ____________________________ Corey Skains, MD bjk:slb D: 07/16/2012 09:08:10 ET T: 07/16/2012 10:25:27 ET JOB#: [id number]  cc: Corey Skains, MD, <Dictator> Corey Skains MD ELECTRONICALLY SIGNED 07/19/2012 13:45

## 2014-12-30 NOTE — Op Note (Signed)
PATIENT NAME:  Deborah Obrien, PEZZUTI MR#:  [id number] DATE OF BIRTH:  1941-01-01  DATE OF PROCEDURE:  04/02/2012  PREOPERATIVE DIAGNOSIS: Right shoulder impingement syndrome.   POSTOPERATIVE DIAGNOSIS: Right shoulder impingement syndrome.    PROCEDURE PERFORMED: Right subacromial decompression.   SURGEON: Laurice Record. Holley Bouche., MD   ANESTHESIA: Interscalene block and general.   ESTIMATED BLOOD LOSS: Minimal.   DRAINS: None.   INDICATIONS FOR SURGERY: The patient is a 74 year old female who has been seen for complaints of right shoulder pain. Pain was aggravated by overhead reaching or reaching behind her back. MRI demonstrated findings consistent with significant tendinosis and impingement-type anatomy. After discussion of the risks and benefits of surgical intervention, the patient expressed understanding of the risks and benefits and agreed with plans for surgical intervention.   PROCEDURE IN DETAIL: The patient was brought in the operating room, and after adequate interscalene block and general endotracheal anesthesia was achieved, the patient was placed in modified beach chair position. Head was secured in a headrest and all bony prominences were well padded. The patient's right shoulder and arm were cleaned and prepped with ChloraPrep and draped in the usual sterile fashion. A "time-out" was performed as per usual protocol. The anticipated incision site was injected with 0.25% Marcaine with epinephrine. Anterior oblique incision was made roughly bisecting the anterior aspect of the acromion. Deltoid was split in line and a "deltoid on" approach was utilized by elevating the deltoid in a subperiosteal fashion off the acromion. The subdeltoid bursa was thickened and inflamed and was resected. A Darrach retractor was inserted to protect the cuff and an osteotome was used to remove a 3 to 4 mm thick wedge of bone from the anterior/inferior aspect of the acromion. Additional debridement and contouring  was performed using a TPS high speed rasp. Wound was irrigated with copious amounts of normal saline with antibiotic solution. Shoulder was placed through a range of motion. Good range of motion was appreciated. There was no visible or palpable full thickness tear. Some fraying of the fibers were noted laterally but the cuff appeared to be intact. 10 mL of 0.25% Marcaine with epinephrine was injected into the joint and the shoulder again placed through a range of motion. There was no extravasation of fluid to suggest even a small tear. The wound was again irrigated with copious amounts of normal saline with antibiotic solution. The deltoid was repaired in a side-to-side fashion using interrupted sutures of #1 Ethibond. Subcutaneous tissue was approximated in layers using first #0 Vicryl followed by #2-0 Vicryl. The skin was closed with a running subcuticular suture of #4-0 Vicryl. Steri-Strips were applied followed by application of a sterile dressing.   The patient tolerated the procedure well. She was transported to the recovery room in stable condition.   ____________________________ Laurice Record. Holley Bouche., MD jph:drc D: 04/02/2012 10:28:33 ET T: 04/02/2012 10:44:23 ET JOB#: [id number]  cc: Jeneen Rinks P. Holley Bouche., MD, <Dictator> JAMES P Holley Bouche MD ELECTRONICALLY SIGNED 04/02/2012 11:18

## 2014-12-30 NOTE — H&P (Signed)
PATIENT NAME:  Deborah Obrien, Deborah Obrien MR#:  [id number] DATE OF BIRTH:  Jul 23, 1941  DATE OF ADMISSION:  07/16/2012  PRIMARY CARE PHYSICIAN: Frazier Richards, MD   REFERRING PHYSICIAN: Marta Antu, MD  CHIEF COMPLAINT: Palpitations and mild chest pressure x2 days.   HISTORY OF PRESENT ILLNESS: Deborah Obrien is a 74 year old Caucasian female who was in her usual state of health until the last few days when she has been feeling unwell with nonspecific symptoms, a little bit restless at night. She felt palpitations, and finally she decided to check her pulse and blood pressure and found that she is tachycardiac at a rate of 140 per minute. Along with that, she describes very mild chest pressure-like feeling or discomfort. Her husband called EMS, and the patient was transported to the Emergency Department for evaluation where she was found to have atrial fibrillation with rapid ventricular rate. Several attempts to control the rate with boluses of intravenous diltiazem had failed, and then finally metoprolol 5 mg intravenously was tried, and that also did not work.  The rate went down to mid 130s. The patient is now in the process to be admitted to the Intensive Care Unit on IV diltiazem drip.    REVIEW OF SYSTEMS: CONSTITUTIONAL: Denies having any fever. No chills. No fatigue. EYES: No blurring of vision. No double vision. ENT: She has chronic hearing loss in the right ear. No sore throat. No dysphagia. CARDIOVASCULAR: No chest pain except for mild chest pressure feeling, a little shortness of breath. No syncope but she has palpitations. RESPIRATORY: No cough. No sputum production. No hemoptysis. She has mild chest pressure feeling. GASTROINTESTINAL: No abdominal pain known. No vomiting, no diarrhea. GENITOURINARY: No dysuria. No frequency of urination. MUSCULOSKELETAL: No joint pain or swelling other than her chronic back pain and chronic right shoulder and right knee pain. No muscular pain or swelling.  INTEGUMENTARY: No skin rash. No ulcers. NEUROLOGY: No focal weakness. No seizure activity. No headache. PSYCHIATRY: No anxiety or depression. ENDOCRINE: No polyuria or polydipsia. No heat or cold intolerance.   PAST MEDICAL HISTORY:  1. She has a prior history of paroxysmal active fibrillation. The last episode was several years ago. Since that time it appears that she maintained sinus rhythm.  2. Systemic hypertension.  3. Diabetes mellitus, type 2.  4. Hyperlipidemia.  5. Fatty liver infiltration.  6. Obstructive sleep apnea syndrome. The patient did not tolerate CPAP treatment. 7. Osteoarthritis. 8. Osteoporosis.  9. Chronic kidney disease, stage 3.  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   PAST SURGICAL HISTORY:  1. Cholecystectomy.  2. Appendectomy.  3. Hysterectomy. 4. C-spine surgery. 5. Lumbar spinal fusion.  6. Lumbar laminectomy.  7. History of bowel obstruction, underwent partial colectomy. 8. Left total knee arthroplasty.   SOCIAL HABITS: Nonsmoker. No history of alcohol or drug abuse.   FAMILY HISTORY: Her mother suffered from diabetes, hypertension and chronic kidney disease. No family history of atrial fibrillation or premature coronary artery disease.   SOCIAL HISTORY: She is married, living with her husband.   ADMISSION MEDICATIONS:  1. Vitamin D3 400 international units, 2 tablets a day.  2. Multivitamin once a day.  3. Victoza 0.6 mg once a day.  4. Lantus 30 units at night. 5. Fexofenadine 180  mg once a day p.r.n.  6. Docusate sodium 100 mg, 2 capsules once a day.  7. Calcium with vitamin D. 8. Benadryl once a day p.r.n.  9. Aspirin 81 mg a day.  10. Lyrica 75 mg b.i.d.  11. NovoLog insulin t.i.d. with sliding scale.  12. Omeprazole 20 mg a day. 13. Oxycodone 5 mg every 4 hours p.r.n.  14. Paxil CR 25 mg once a day.  15. Ultram 50 mg, 2 tablets every 4 hours p.r.n.  16. VESIcare 10 mg once a day.   ALLERGIES: She reports she is very sensitive to  narcotics, including Dilaudid and OxyContin. Aspirin is tolerated at a small dose but higher dose causes nausea and vomiting. On Bextra and Celebrex she has side effects and GI problems. Codeine causes nausea and vomiting. Demerol causes respiratory distress. Erythromycin causes nausea and vomiting. Iodine causes hives. Keflex causes rash. Lipitor causes nausea and vomiting. She also has adverse reactions to fentanyl and morphine.  She is also allergic to sulfa and shellfish.   PHYSICAL EXAMINATION:  VITAL SIGNS: Blood pressure 120/55, respiratory rate 20, pulse 136, temperature 98, oxygen saturation 96%.   GENERAL APPEARANCE: Elderly female lying in bed in no acute distress.   HEENT: Head: No pallor. No icterus. No cyanosis. ENT: Hearing is slightly impaired. Nasal mucosa, lips, tongue were normal. Eyes: Examination revealed normal eyelids and conjunctiva. Pupils about 4 mm, very sluggishly reactive to light.   NECK: Supple. Trachea at midline. No thyromegaly. No cervical lymphadenopathy. No masses.   HEART: Distant heart sounds, irregular. No murmur was appreciated. No carotid bruits.   RESPIRATORY: Exam showed normal breathing pattern without use of accessory muscles. No rales. No wheezing.   ABDOMEN: Soft without tenderness. No hepatosplenomegaly. No masses. No hernias.   SKIN: No ulcers. No subcutaneous nodules.   MUSCULOSKELETAL: No joint swelling. No clubbing. No calf tenderness. No swelling in lower extremities. No palpable cords.   NEUROLOGICAL: Cranial nerves II through XII are intact. No focal motor deficit.  PSYCHIATRY: The patient is alert and oriented x3. Mood and affect were normal.   LABORATORY, DIAGNOSTIC AND RADIOLOGICAL DATA: Chest x-ray showed mild cardiomegaly. No effusion. No consolidation. EKG showed atrial fibrillation with rapid ventricular rate at rate of 136 per minute. I cannot exclude underlying atrial flutter. However, carotid massage had failed to slow the rate  to reveal if there is any atrial flutter underneath the rhythm. There are nonspecific ST-T wave abnormalities. Serum glucose 71, BUN 20, creatinine 1.2, sodium 140, potassium 4.0. Normal liver function tests. Estimated GFR is 52. Total CPK 116. Troponin 0.02. TSH was 3.9. CBC showed white count of 9000, hemoglobin 14, hematocrit 43, platelet count 251.   ASSESSMENT:  1. Acute fibrillation with rapid ventricular rate.  2. Chest discomfort, mild pressure feeling likely secondary to her rapid atrial fibrillation.  3. Systemic hypertension.  4. Diabetes mellitus, type 2, on insulin.  5. Hyperlipidemia.  6. Fatty liver infiltration. 7. Chronic kidney disease, stage 2 to 3.  8. Obstructive sleep apnea syndrome. She did not tolerate CPAP treatment.  9. Osteoarthritis.  10. History of cholecystectomy.  11. Appendectomy.  12. Hysterectomy. 13. C-spine surgery and spinal effusion.  14. Bowel obstruction, status post partial colectomy.   PLAN:  1. We will admit the patient to the Intensive Care Unit, started intravenous diltiazem.  2. IV heparin protocol for anticoagulation and to prevent thromboembolic event pending further evaluation of her atrial fibrillation.  3. Obtain echocardiogram and Cardiology consult. Follow up on troponin every 8 hours x2 additional tests.  4. Accu-Cheks and insulin sliding scale.  5. Continue home medications as listed above.  6. I did not start her Victoza since it is nonformulary. This can be resumed upon discharge.  CODE STATUS: The patient indicates that she has a LIVING WILL but nothing specific about the medical issues; however, she had appointed her husband to have the Power of Attorney to take over in case she is incapacitated.   TIME SPENT:   Time spent in evaluating this patient and reviewing medical records took more than one hour.   ____________________________ Clovis Pu. Lenore Manner, MD amd:cbb D: 07/16/2012 05:23:09 ET T: 07/16/2012 08:37:03  ET JOB#: [id number]  cc: Clovis Pu. Lenore Manner, MD, <Dictator> Ocie Cornfield. Ouida Sills, MD Clovis Pu Dawson MD ELECTRONICALLY SIGNED 07/16/2012 22:33

## 2015-01-02 NOTE — Op Note (Signed)
PATIENT NAME:  Deborah Obrien, Deborah Obrien MR#:  [id number] DATE OF BIRTH:  1941-09-10  DATE OF PROCEDURE:  10/10/2012  PREOPERATIVE DIAGNOSIS: Degenerative arthrosis of the right knee.   POSTOPERATIVE DIAGNOSIS: Degenerative arthrosis of the right knee.   PROCEDURE PERFORMED: Right total knee arthroplasty using computer-assisted navigation.   SURGEON: Skip Estimable, MD.  Terrence Dupont Vance Peper, PA-C (required to maintain retraction throughout the procedure).   ANESTHESIA: Femoral nerve block and general.   ESTIMATED BLOOD LOSS: 50 mL.   FLUIDS REPLACED: 1900 mL of crystalloid.   TOURNIQUET TIME: 91 minutes.   DRAINS: Two medium drains to reinfusion system.   SOFT TISSUE RELEASES: Anterior cruciate ligament, posterior cruciate ligament, deep medial collateral ligament and patellofemoral ligament.  IMPLANTS UTILIZED: DePuy PFC Sigma size 4 posterior stabilized femoral component (cemented), size 4 MBT tibial component (cemented), 38 mm 3-peg oval dome patella (cemented) and a 10 mm stabilized rotating platform polyethylene insert.   INDICATIONS FOR SURGERY: The patient is a 74 year old female, who has been seen for complaints of progressive right knee pain. X-rays demonstrated severe degenerative changes in tricompartmental fashion with varus deformity. After discussion of the risks and benefits of surgical intervention, the patient expressed her understanding of the risks and benefits and agreed with plans for surgical intervention.   PROCEDURE IN DETAIL: The patient was brought in the operating room and then after adequate femoral nerve block and general anesthesia was achieved, a tourniquet was placed on the patient's upper right thigh. The patient's right knee and leg were cleaned and prepped with alcohol and DuraPrep draped in the usual sterile fashion. A "timeout" was performed as per usual protocol. The right lower extremity was exsanguinated using an Esmarch and the tourniquet was inflated to  300 mmHg. An anterior longitudinal incision was made followed by a standard mid vastus approach. A moderate effusion was evacuated. The deep fibers of the medial collateral ligament were elevated in a subperiosteal fashion off the medial flare of the tibia so as to maintain a continuous soft tissue sleeve. The patella was subluxed laterally and the patellofemoral ligament was incised. Inspection of the knee demonstrated severe degenerative changes in tricompartmental fashion with most significant changes noted to the medial compartment. Prominent osteophytes were debrided using a rongeur. Anterior and posterior cruciate ligaments were excised. Two 4.0 mm Schanz pins were inserted into the femur and into the tibia for attachment of the array of trackers used for computer-assisted navigation. Hip center was identified using circumduction technique. Distal landmarks were mapped using the computer. The distal femur and proximal tibia were mapped using the computer. Distal femoral cutting guide was positioned using computer-assisted navigation so as to achieve a 5 degree distal valgus cut. Cut was performed and verified using the computer. The distal femur was sized and it was felt that a size 4 femoral component was appropriate. A size 4 cutting guide was positioned and the anterior cut was performed and verified using the computer. This was followed by completion of the posterior and chamfer cuts. Femoral cutting guide for central box was then positioned and the central box cut was performed.   Attention was then directed to the proximal tibia. Medial and lateral menisci were excised. The extramedullary tibial cutting guide was positioned using computer-assisted navigation so as to achieve 0 degrees of varus valgus alignment and 0 degrees of posterior slope. Cut was performed and verified using the computer. The proximal tibia was sized and it was felt that a size 4 tibial tray was appropriate. Tibial  and femoral  trials were inserted followed by insertion of a 10 mm polyethylene insert. This allowed for excellent mediolateral soft tissue balancing with the knee in full extension and in flexion. Finally, the patella was cut and prepared so as to accommodate a 38 mm 3-peg oval dome patella. Patellar trial was placed and the knee was placed through a range of motion with excellent patellar tracking appreciated. Femoral trial was removed. Central post hole for the tibial component was reamed followed by insertion of a keel punch. Tibial trials were then removed. The cut surfaces of bone were irrigated with copious amounts of normal saline with antibiotic solution using pulsatile lavage and then suctioned dry. Polymethyl methacrylate cement with gentamicin was prepared in the usual fashion using a vacuum mixer. Cement was applied to the cut surface of the proximal tibia as well as along the undersurface of a size 4 MBT tibial component. The tibial component was positioned and impacted into place. Excess cement was removed using [employer] fast streamer. Cement was then applied to the cut surface of the femur as well as along the posterior flanges of size 4 posterior stabilized femoral component. Femoral component was positioned and impacted into place. Excess cement was removed using [employer] fast streamer. A 10 mm polyethylene trial was inserted and the knee was brought in full extension with steady axial compression applied. Finally, cement was applied to the backside of a 38 mm 3-peg oval dome patella and the patellar component was positioned and patellar clamp applied. Excess cement was removed using [employer] fast streamer.   After adequate curing of the cement, the tourniquet was deflated after total tourniquet time of 91 minutes. Hemostasis was achieved using electrocautery. The knee was irrigated with copious amounts of normal saline with antibiotic solution using pulsatile lavage and then suctioned dry. The knee was inspected for any residual  cement debris. Marcaine 30 mL of 0.25% with epinephrine was injected along the posterior capsule. A 10 mm stabilized rotating platform polyethylene insert was inserted and the knee was placed through a range of motion. Excellent patellar tracking was appreciated. Excellent mediolateral soft tissue balancing was appreciated.   Two medium drains were placed in the wound bed and brought out through a separate stab incision to be attached to a reinfusion system. The medial parapatellar portion of the incision was reapproximated using interrupted sutures of #1 Vicryl. The subcutaneous tissue was approximated in layers using first #0 Vicryl followed by 2-0 Vicryl. Skin was closed with skin staples. Sterile dressing was applied. The patient tolerated the procedure well. She was transported to the recovery room in stable condition.   ____________________________ Laurice Record. Holley Bouche., MD jph:aw D: 10/10/2012 11:13:20 ET T: 10/10/2012 11:24:15 ET JOB#: [id number]  cc: Jeneen Rinks P. Holley Bouche., MD, <Dictator> JAMES P Holley Bouche MD ELECTRONICALLY SIGNED 10/15/2012 11:07

## 2015-01-02 NOTE — Consult Note (Signed)
PATIENT NAME:  Deborah Obrien, Deborah Obrien MR#:  [id number] DATE OF BIRTH:  July 05, 1941  DATE OF CONSULTATION:  10/13/2012  REFERRING PHYSICIAN:   CONSULTING PHYSICIAN:  Cheral Marker. Ola Spurr, MD  REASON FOR CONSULTATION:  Management of diabetes, hyperkalemia and hypertension.   HISTORY OF PRESENT ILLNESS: This is a very pleasant 74 year old female who underwent total knee replacement January 29th by Dr. Marry Guan. She has a history of diabetes, hypertension and well-controlled atrial fibrillation on anticoagulation. The patient reports that her sugars have been a little out of control as an outpatient due to pain. Heart rate also been somewhat elevated. She is currently having some pain postoperatively, but thinks this is driving up her heart rate a little. Otherwise, she has no, focal complaints.   PAST MEDICAL HISTORY:  1.  Atrial fibrillation on anticoagulation with rate control.  2.  Hypertension.  3.  Diabetes.  4.  Hyperlipidemia.  5.  Fatty liver.  6.  Obstructive sleep apnea.  7.  Arthritis.  8.  Chronic kidney disease, stage III.   PAST SURGICAL HISTORY:    1.  Cholecystectomy.  2.  Appendectomy.  3.  Hysterectomy.  4.  C-spine surgery.  5.  Lumbar spinal fusion.  6.  Lumbar laminectomy.  7.  Partial colectomy.  8.  Left total knee arthroplasty.   SOCIAL HISTORY: The patient does not smoke, does not drink.  The patient is married and lives with her husband.  FAMILY HISTORY: Positive for diabetes, hypertension, kidney disease.   REVIEW OF SYSTEMS:   Eleven systems reviewed and negative except as per history of present illness.   CURRENT MEDICATIONS: 1.  Tylenol.  2.  Maalox.  3.  Bisacodyl.  4.  Calcium with vitamin D.  5.  Vitamin D3.  6.  Benadryl.  7.  Senokot.  8.  Anzemet p.r.n.  9.  Allegra p.r.n.  10.  Insulin NovoLog 15 units t.i.d.  11.  Lantus 30 units at bedtime.  12.  Magnesium hydroxide.  13.  Metoprolol 25 mg at bedtime.  14.  Morphine p.r.n.  15.   Multivitamin.  16.  Omeprazole.  17.  Victoza.  18.  Zofran.  19.  Paxil 120 mg once a day.  20.  Lyrica 75 twice a day.  21.  Xarelto 20 mg once a day.  22.  VESIcare 10 mg once a day.  23.  Tramadol p.r.n.   ALLERGIES: The patient is allergic to multiple drugs including: 1.  STATINS. 2.  SULFA. 3.  NSAIDs. 4.  ASPIRIN. Adrian.  6.  ERYTHROMYCIN. 7.  CODEINE. 8.  DILAUDID 9.  OXYCODONE. 10.  IV CONTRAST.    REVIEW OF SYSTEMS:  Eleven systems reviewed and negative except as per history of present illness.   PHYSICAL EXAMINATION: VITAL SIGNS: Temperature 97.5, pulse between 84 and 118, blood pressure 123/82, sat 96% on room air.  GENERAL: She is obese in no acute distress sitting in a chair with her leg propped up. HEENT: Pupils are equal, round, reactive to light and accommodation. Extraocular movements are intact. Sclerae anicteric. Oropharynx clear.  NECK: Supple.  HEART: Regular.  EXTREMITIES:  Her right leg is wrapped in the knee with a cooling device. There is trace edema bilateral lower extremities. NEUROLOGIC:  She is alert and oriented x 3.  Grossly nonfocal neuro exam.   LABORATORY AND DIAGNOSTIC DATA:  Labs today include BUN 17, creatinine 1.26, potassium 4.6, glucose 240. Hemoglobin on 01/31 was 11.   IMPRESSION: A 74 year old  with multiple medical problems postoperative knee. 1.  Diabetes. Her sugar is recently controlled, but continue the Lantus and NovoLog sliding scale.  The patient adjusts this well at home based on a sliding scale.  2.  Hypertension. Blood pressure reasonable.  3.  Atrial fibrillation.  Rate is slightly elevated but likely due to pain. We will continue the current metoprolol and also Xarelto.  4.  Hyperkalemia. Postoperative, the patient had a hemolyzed sample and a follow up sample was slightly elevated potassium but this has normalized.  Continue current medications.  Renal function is stable.  Thank you for the consult.  I will be glad  to follow with you.    ____________________________ Cheral Marker. Ola Spurr, MD dpf:ct D: 10/13/2012 12:59:49 ET T: 10/14/2012 08:46:17 ET JOB#: [id number]  cc: Cheral Marker. Ola Spurr, MD, <Dictator> DAVID Ola Spurr MD ELECTRONICALLY SIGNED 10/21/2012 21:55

## 2015-01-02 NOTE — Discharge Summary (Signed)
PATIENT NAME:  Deborah, Obrien MR#:  [id number] DATE OF BIRTH:  08-Nov-1940  DATE OF ADMISSION:  10/10/2012 DATE OF DISCHARGE:  10/13/2012  ADMITTING DIAGNOSIS: Degenerative arthrosis of the right knee.   DISCHARGE DIAGNOSIS: Degenerative arthrosis of the right knee.   HISTORY: The patient is a pleasant 74 year old white female who has been followed at [facility] for progression of right knee pain. The patient had reported a greater than one year history of right knee pain. She had localized most of the pain along the medial aspect of the knee. Her pain was noted be aggravated with weight-bearing activities. She was having difficulty with stair ambulation. The patient had not seen any significant improvement in her condition despite the use of a knee brace. She was unable to tolerate antiinflammatory medications secondary to anticoagulation therapy of Xarelto. She has been using Ultram for pain control. At the time of surgery, she was not using any ambulatory aids. The patient had denied any locking of the knee, but occasionally had had some near giving way. On occasion she was noted to have activity-related swelling in the knee. She had denied any gross numbness or radicular symptoms. Her right knee pain had progressed to the point that it was significantly interfering with her activities of daily living. X-rays taken in [facility] orthopedic department showed narrowing of the medial cartilage space with associated varus alignment. Osteophyte formation as well as subchondral sclerosis was noted. Degenerative change was noted to the patellofemoral articulation. After discussion of the risks and benefits of surgical intervention, the patient expressed her understanding of the risks and benefits and agreed with plans for surgical intervention.   PROCEDURE: Right total knee arthroplasty using computer-assisted navigation.   ANESTHESIA: Femoral nerve block with general.   SOFT TISSUE RELEASE:  Anterior cruciate ligament, posterior cruciate ligament, deep medial collateral ligament, as well as patellofemoral ligament.   IMPLANTS UTILIZED: DePuy PFC Sigma size 4 posterior stabilized femoral component (cemented), size 4 MBT tibial component (cemented), 38 mm three pegged oval dome patella (cemented), and a 10 mm stabilized rotating platform polyethylene insert.   HOSPITAL COURSE: The patient tolerated the procedure very well. She had no complications. She was then taken to the PAC-U where she was stabilized and then transferred to the orthopedic floor. The patient began receiving anticoagulation therapy of Xarelto 20 mg q. day. She was fitted with TED stockings bilaterally. These were allowed to be removed 1 hour per 8 hour. The right one was applied on day 2 following removal of the Hemovac and dressing change. The patient was also fitted with the AV-I compression foot pumps bilaterally set at 80 mmHg. The patient has had no evidence of any DVTs of the lower extremities. Calves have been nontender. Negative Homans sign. Her heels were elevated off the bed using rolled towels.   The patient has denied any chest pain or shortness of breath. Vital signs have been stable. She has been afebrile. Hemodynamically she was stable and no transfusions were given other than the Autovac transfusion given the first 6 hours postoperatively.   Physical therapy was initiated on day 1 for gait training and transfers. This has been very slow. She recently had undergone surgery on the rotator cuff of the right shoulder and this is slowing her progress down to some degree. Occupational therapy was also initiated on day 1 for ADL and assistive devices. Overall she has done very well.   The patient's IV, Foley and Hemovac were discontinued on day 2  along with a dressing change. The Polar Care was reapplied to the surgical leg maintaining a temperature of 40 to 50 degrees Fahrenheit.   The patient is being discharged  to a skilled nursing facility in improved stable condition. She is to continue weight-bearing as tolerated. Continue to elevate the feet off the bed using 1 or 2 pillows. Continue with thigh-high TED stockings. These are to be worn around-the-clock, but may be removed 1 hour per 8 hour shift. Continue Polar Care maintaining a temperature of 40 to 50 degrees Fahrenheit. Incentive spirometer q. 1 hour while awake. Encourage cough and deep breathing every 2 hours while awake. The patient has a followup appointment on 10/25/2012  at 9:45. She is placed on an ADA diet.   DRUG ALLERGIES: ASPIRIN, DEXTRA, CELEBREX, CODEINE, RADIOIODINE CONTRAST DYE, DEMEROL, DILAUDID, ERYTHROMYCIN, IV DYE, KEFLEX, LIPITOR, NSAIDS, OXYCODONE, SULFA DRUGS. Fish, shellfish, ragweed and tape.   MEDICATIONS: 1. Victoza 18 mg/3 mg subcutaneous daily. 2. Prilosec 20 mg q. 6 a.m.  3. Paxil 20 mg daily. 4. Lyrica 75 mg 2 times a day. 5. VESIcare 10 mg daily.  6. Xarelto 20 mg q. 5:00 a.m.  7. Insulin NovoLog 15 units subcutaneous 3 times daily before meals.  8. Lantus 30 units sub-Q at bedtime. 9. Novolin R sliding scale insulin. 10. Tylenol ES 500 to 1000 mg every 4 hours p.r.n. for temperatures greater than 100.4. 11. Milk of magnesia 30 mL every 6 hours p.r.n.  12. Dulcolax suppository 10 mg rectal daily p.r.n.  13. Allegra 180 mg p.r.n. q. a.m.  14. Milk of magnesia 30 mL 2 times a day p.r.n.  15. Nucynta 50 mg to 100 mg q. 4 hours p.r.n.  16. Tramadol 50 mg to 100 mg q. 4 hours p.r.n.  17. Benadryl 25 mg q. 6 hours p.r.n.  18. Enema soapsuds if no results with milk of magnesia or Dulcolax.   PAST MEDICAL HISTORY: 1. Diabetes. 2. Chronic kidney disease. 3. Hypertension.  4. Hypercholesterolemia.  5. Anxiety attacks. 6. Acid reflux.  7. Atrial fibrillation. 8. Seasonal allergies.  9. Fatty liver.  10. Osteoporosis.  11. Chickenpox.  12. Menstrual problems.  13. Arthritis.  14. Hemorrhoids.  15. Kidney  stones.         16. Hospitalized for partial intestinal blockage in 2004 and 2005. 6. Hospitalized for overdose in 2009. ____________________________ Vance Peper, PA jrw:sb D: 10/11/2012 08:53:00 ET T: 10/11/2012 09:39:58 ET JOB#: [id number]  cc: Vance Peper, PA, <Dictator> JON WOLFE PA ELECTRONICALLY SIGNED 10/14/2012 18:15

## 2015-01-03 NOTE — Consult Note (Signed)
Chief Complaint:  Subjective/Chief Complaint Patient states no worsening chest pain and improve shortness of breath denies any palpitations or tachycardia. feels better than she did  yesterday   VITAL SIGNS/ANCILLARY NOTES: **Vital Signs.:   27-Oct-15 12:00  Vital Signs Type Routine  Pulse Pulse 62  Respirations Respirations 16  Systolic BP Systolic BP 540  Diastolic BP (mmHg) Diastolic BP (mmHg) 70  Mean BP 94  Pulse Ox % Pulse Ox % 98  Pulse Ox Activity Level  At rest  Oxygen Delivery Room Air/ 21 %  *Intake and Output.:   27-Oct-15 10:00  Grand Totals Intake:  120 Output:      Net:  120 24 Hr.:  40  Oral Intake      In:  120   Brief Assessment:  GEN well developed, well nourished, no acute distress   Cardiac Regular   Respiratory normal resp effort  clear BS   Gastrointestinal Normal   Gastrointestinal details normal Soft  Nontender  Nondistended  No masses palpable   EXTR negative cyanosis/clubbing, negative edema   Lab Results: LabObservation:  26-Oct-15 13:56   OBSERVATION Reason for Test  Cardiology:  26-Oct-15 02:05   Ventricular Rate 143  Atrial Rate 149  QRS Duration 76  QT 304  QTc 469  R Axis 35  T Axis -160  ECG interpretation *** Poor data quality, interpretation may be adversely affected Atrial fibrillation with rapid ventricular response with premature ventricular or aberrantly conducted complexes Septal infarct , age undetermined ST & T wave abnormality, consider anterior ischemia or digitalis effect Abnormal ECG When compared with ECG of 29-Oct-2012 12:09, Significant changes have occurred ----------unconfirmed---------- Confirmed by OVERREAD, NOT ([id number]), editor PEARSON, BARBARA ([id number]) on 07/07/2014 2:08:03 PM    13:56   Echo Doppler REASON FOR EXAM:     COMMENTS:     PROCEDURE: [facility] - ECHO DOPPLER COMPLETE(TRANSTHOR)  - Jul 07 2014  1:56PM   RESULT: Echocardiogram Report  Patient Name:   Deborah Obrien Date of Exam:  07/07/2014 Medical Rec #:  [id number]            Custom1: Date of Birth:  [birth year]        Height:       64.0 in Patient Age:    74 years          Weight:       280.0 lb Patient Gender: F                 BSA:          2.26 m??  Indications: Atrial Fib Sonographer:    Sherrie Sport RDCS Referring Phys: Hillary Bow, R  Sonographer Comments: Suboptimal apical window.  Summary:  1. Left ventricular ejection fraction, by visual estimation, is 65 to  70%.  2. Normal global left ventricular systolic function.  3. Impaired relaxation pattern of LV diastolic filling.  4. Mildly dilated left atrium.  5. Mild thickening of the anterior and posterior mitral valve leaflets.  6. Mild to moderate mitral valve regurgitation.  7. Mild aortic regurgitation.  8. Mildly increased left ventricular posterior wall thickness.  9. Mild tricuspid regurgitation. 2D AND M-MODE MEASUREMENTS (normal ranges within parentheses): Left Ventricle:          Normal IVSd (2D):      1.22 cm (0.7-1.1) LVPWd (2D):     1.29 cm (0.7-1.1) Aorta/LA: Normal LVIDd (2D):     4.88 cm (3.4-5.7) Aortic Root (2D): 3.10 cm (2.4-3.7) LVIDs (2D):  3.06 cm           Left Atrium (2D): 4.00 cm (1.9-4.0) LV FS (2D):     37.3 %   (>25%) LV EF (2D):     67.1 %   (>50%) Right Ventricle:                                   RVd (2D):        9.70 cm LV DIASTOLIC FUNCTION: MV Peak E: 1.24 m/s E/e' Ratio: 14.10 MV Peak A: 0.88 m/s Decel Time: 274 msec E/A Ratio: 1.40 SPECTRAL DOPPLER ANALYSIS (where applicable): Mitral Valve: MV P1/2 Time: 79.46 msec MV Area, PHT: 2.77 cm?? Aortic Valve: AoV Max Vel: 1.45 m/s AoV Peak PG: 8.4 mmHg AoV Mean PG:  5.0 mmHg LVOT Vmax: 0.79 m/s LVOT VTI: 0.189 m LVOT Diameter: 2.00 cm AoV Area, Vmax: 1.71 cm?? AoV Area, VTI: 1.55cm?? AoV Area, Vmn: 1.33 cm?? Tricuspid Valve and PA/RV Systolic Pressure: TR Max Velocity: 2.46 m/s RA  Pressure: 5 mmHg RVSP/PASP: 29.3 mmHg Pulmonic Valve: PV Max Velocity: 1.18  m/s PV Max PG: 5.6 mmHg PV Mean PG:  PHYSICIAN INTERPRETATION: Left Ventricle: The left ventricular internal cavity size was normal. LV  septal wall thickness was normal. LV posterior wall thickness was mildly   increased. Global LV systolic function was normal. Left ventricular  ejection fraction, by visual estimation, is 65 to 70%. Spectral Doppler  shows impaired relaxation pattern of LV diastolic filling. Right Ventricle: The right ventricular size is normal. Global RV systolic  function is normal. Left Atrium: The left atrium is mildly dilated. Right Atrium: The right atrium is normal in size. Pericardium: There is no evidence of pericardial effusion. Mitral Valve: The mitral valve is normal in structure. There is mild  thickening of the anterior and posterior mitral valve leaflets. Mild to  moderate mitral valve regurgitation is seen. Tricuspid Valve: The tricuspid valve is normal. Mild tricuspid  regurgitation is visualized. The tricuspid regurgitant velocity is 2.46  m/s, and with an assumed right atrial pressure of 5 mmHg, the estimated  right ventricular systolic pressure is normal at 29.3 mmHg. Aortic Valve: The aortic valve is normal. Mild aortic valve regurgitation  is seen. Pulmonic Valve: The pulmonic valve is normal.  [place] MD Electronically signed by [place] MD Signature Date/Time: 07/08/2014/9:02:51 AM  *** Final ***  IMPRESSION: .    Verified By: Yolonda Kida, M.D., MD  27-Oct-15 06:45   Ventricular Rate 62  Atrial Rate 62  P-R Interval 150  QRS Duration 84  QT 424  QTc 430  P Axis 74  R Axis 54  T Axis 70  ECG interpretation Normal sinus rhythm Nonspecific ST and T wave abnormality Abnormal ECG When compared with ECG of 07-Jul-2014 02:05, Sinus rhythm has replaced Atrial fibrillation Vent. rate has decreased BY  81 BPM Criteria for Septal infarct are no longer Present ST no longer depressed in Lateral  leads Nonspecific T wave abnormality no longer evident in Inferior leads T wave inversion less evident in Anterior leads ----------unconfirmed---------- Confirmed by OVERREAD, NOT ([id number]), editor PEARSON, BARBARA ([id number]) on 07/08/2014 8:13:14 AM  Routine Chem:  26-Oct-15 02:36   Glucose, Serum  216  BUN  30  Creatinine (comp)  1.48  Sodium, Serum 144  Potassium, Serum 3.8  Chloride, Serum  108  CO2, Serum 27  Calcium (Total), Serum  7.9  Anion Gap 9  Osmolality (calc) 300  eGFR (African American)  45  eGFR (Non-African American)  37 (eGFR values <6m/min/1.73 m2 may be an indication of chronic kidney disease (CKD). Calculated eGFR, using the MRDR Study equation, is useful in  patients with stable renal function. The eGFR calculation will not be reliable in acutely ill patients when serum creatinine is changing rapidly. It is not useful in patients on dialysis. The eGFR calculation may not be applicable to patients at the low and high extremes of body sizes, pregnant women, and vetetarians.)  Cardiac:  26-Oct-15 02:36   Troponin I < 0.02 (0.00-0.05 0.05 ng/mL or less: NEGATIVE  Repeat testing in 3-6 hrs  if clinically indicated. >0.05 ng/mL: POTENTIAL  MYOCARDIAL INJURY. Repeat  testing in 3-6 hrs if  clinically indicated. NOTE: An increase or decrease  of 30% or more on serial  testing suggests a  clinically important change)    06:22   Troponin I < 0.02 (0.00-0.05 0.05 ng/mL or less: NEGATIVE  Repeat testing in 3-6 hrs  if clinically indicated. >0.05 ng/mL: POTENTIAL  MYOCARDIAL INJURY. Repeat  testing in 3-6 hrs if  clinically indicated. NOTE: An increase or decrease  of 30% or more on serial  testing suggests a  clinically important change)    10:34   Troponin I < 0.02 (0.00-0.05 0.05 ng/mL or less: NEGATIVE  Repeat testing in 3-6 hrs  if clinically indicated. >0.05 ng/mL: POTENTIAL  MYOCARDIAL INJURY. Repeat  testing in 3-6 hrs if  clinically  indicated. NOTE: An increase or decrease  of 30% or more on serial  testing suggests a  clinically important change)  Routine Coag:  26-Oct-15 02:36   Prothrombin  23.3  INR 2.1 (INR reference interval applies to patients on anticoagulant therapy. A single INR therapeutic range for coumarins is not optimal for all indications; however, the suggested range for most indications is 2.0 - 3.0. Exceptions to the INR Reference Range may include: Prosthetic heart valves, acute myocardial infarction, prevention of myocardial infarction, and combinations of aspirin and anticoagulant. The need for a higher or lower target INR must be assessed individually. Reference: The Pharmacology and Management of the Vitamin K  antagonists: the seventh ACCP Conference on Antithrombotic and Thrombolytic Therapy. CHLKTG.2563Sept:126 (3suppl): [id number] A HCT value >55% may artifactually increase the PT.  In one study,  the increase was an average of 25%. Reference:  "Effect on Routine and Special Coagulation Testing Values of Citrate Anticoagulant Adjustment in Patients with High HCT Values." American Journal of Clinical Pathology 2006;126:400-405.)  Routine Hem:  26-Oct-15 02:36   WBC (CBC) 8.1  RBC (CBC)  3.72  Hemoglobin (CBC)  9.9  Hematocrit (CBC)  31.9  Platelet Count (CBC) 287 (Result(s) reported on 07 Jul 2014 at 03:03AM.)  MCV 86  MCH 26.6  MCHC  31.1  RDW 14.1   Radiology Results: XRay:    26-Oct-15 02:23, Chest Portable Single View  Chest Portable Single View   REASON FOR EXAM:    chest pain  COMMENTS:       PROCEDURE: DXR - DXR PORTABLE CHEST SINGLE VIEW  - Jul 07 2014  2:23AM     CLINICAL DATA:  Left mid chest pain. Shortness of breath. Initial  encounter    EXAM:  PORTABLE CHEST - 1 VIEW    COMPARISON:  07/16/2012    FINDINGS:  Moderate cardiomegaly, not definitely increased from previous when  accounting for differences in technique. Negative aortic  contours.  Coarsened  markings at the bases without edema or definitive  pneumonia. No effusion or pneumothorax. No acute osseous findings.     IMPRESSION:  Cardiomegaly without pulmonary edema.      Electronically Signed    By: Jorje Guild M.D.    On: 07/07/2014 02:32         Verified By: Gilford Silvius, M.D.,  Cardiology:    26-Oct-15 02:05, ED ECG  Ventricular Rate 143  Atrial Rate 149  QRS Duration 76  QT 304  QTc 469  R Axis 35  T Axis -160  ECG interpretation   *** Poor data quality, interpretation may be adversely affected  Atrial fibrillation with rapid ventricular response with premature ventricular or aberrantly conducted complexes  Septal infarct , age undetermined  ST & T wave abnormality, consider anterior ischemia or digitalis effect  Abnormal ECG  When compared with ECG of 29-Oct-2012 12:09,  Significant changes have occurred  ----------unconfirmed----------  Confirmed by OVERREAD, NOT (100), editor PEARSON, BARBARA ([id number]) on 07/07/2014 2:08:03 PM  ED ECG     26-Oct-15 13:56, Echo Doppler  Echo Doppler   REASON FOR EXAM:      COMMENTS:       PROCEDURE: [facility] - ECHO DOPPLER COMPLETE(TRANSTHOR)  - Jul 07 2014  1:56PM     RESULT: Echocardiogram Report    Patient Name:   Deborah Obrien Date of Exam: 07/07/2014  Medical Rec #:  [id number]            Custom1:  Date of Birth:  09/18/1940        Height:       64.0 in  Patient Age:    74 years          Weight:       280.0 lb  Patient Gender: F                 BSA:          2.26 m??    Indications: Atrial Fib  Sonographer:    Sherrie Sport RDCS  Referring Phys: Hillary Bow, R    Sonographer Comments: Suboptimal apical window.    Summary:   1. Left ventricular ejection fraction, by visual estimation, is 65 to   70%.   2. Normal global left ventricular systolic function.   3. Impaired relaxation pattern of LV diastolic filling.   4. Mildly dilated left atrium.   5. Mild thickening of the anterior and  posterior mitral valve leaflets.   6. Mild to moderate mitral valve regurgitation.   7. Mild aortic regurgitation.   8. Mildly increased left ventricular posterior wall thickness.   9. Mild tricuspid regurgitation.  2D AND M-MODE MEASUREMENTS (normal ranges within parentheses):  Left Ventricle:          Normal  IVSd (2D):      1.22 cm (0.7-1.1)  LVPWd (2D):     1.29 cm (0.7-1.1) Aorta/LA: Normal  LVIDd (2D):     4.88 cm (3.4-5.7) Aortic Root (2D): 3.10 cm (2.4-3.7)  LVIDs (2D):     3.06 cm           Left Atrium (2D): 4.00 cm (1.9-4.0)  LV FS (2D):     37.3 %   (>25%)  LV EF (2D):     67.1 %   (>50%)  Right Ventricle:  RVd (2D):        0.71 cm  LV DIASTOLIC FUNCTION:  MV Peak E: 1.24 m/s E/e' Ratio: 14.10  MV Peak A: 0.88 m/s Decel Time: 274 msec  E/A Ratio: 1.40  SPECTRAL DOPPLER ANALYSIS (where applicable):  Mitral Valve:  MV P1/2 Time: 79.46 msec  MV Area, PHT: 2.77 cm??  Aortic Valve: AoV Max Vel: 1.45 m/s AoV Peak PG: 8.4 mmHg AoV Mean PG:   5.0 mmHg  LVOT Vmax: 0.79 m/s LVOT VTI: 0.189 m LVOT Diameter: 2.00 cm  AoV Area, Vmax: 1.71 cm?? AoV Area, VTI: 1.55cm?? AoV Area, Vmn: 1.33 cm??  Tricuspid Valve and PA/RV Systolic Pressure: TR Max Velocity: 2.46 m/s RA   Pressure: 5 mmHg RVSP/PASP: 29.3 mmHg  Pulmonic Valve:  PV Max Velocity: 1.18 m/s PV Max PG: 5.6 mmHg PV Mean PG:    PHYSICIAN INTERPRETATION:  Left Ventricle: The left ventricular internal cavity size was normal. LV   septal wall thickness was normal. LV posterior wall thickness was mildly     increased. Global LV systolic function was normal. Left ventricular   ejection fraction, by visual estimation, is 65 to 70%. Spectral Doppler   shows impaired relaxation pattern of LV diastolic filling.  Right Ventricle: The right ventricular size is normal. Global RV systolic   function is normal.  Left Atrium: The left atrium is mildly dilated.  Right Atrium: The right atrium is normal in  size.  Pericardium: There is no evidence of pericardial effusion.  Mitral Valve: The mitral valve is normal in structure. There is mild   thickening of the anterior and posterior mitral valve leaflets. Mild to   moderate mitral valve regurgitation is seen.  Tricuspid Valve: The tricuspid valve is normal. Mild tricuspid   regurgitation is visualized. The tricuspid regurgitant velocity is 2.46   m/s, and with an assumed right atrial pressure of 5 mmHg, the estimated   right ventricular systolic pressure is normal at 29.3 mmHg.  Aortic Valve: The aortic valve is normal. Mild aortic valve regurgitation   is seen.  Pulmonic Valve: The pulmonic valve is normal.    [place] MD  Electronically signed by Liborio Negron Torres MD  Signature Date/Time: 07/08/2014/9:02:51 AM    *** Final ***    IMPRESSION: .        Verified By: Yolonda Kida, M.D., MD    27-Oct-15 06:45, ECG  Ventricular Rate 62  Atrial Rate 62  P-R Interval 150  QRS Duration 84  QT 424  QTc 430  P Axis 74  R Axis 54  T Axis 70  ECG interpretation   Normal sinus rhythm  Nonspecific ST and T wave abnormality  Abnormal ECG  When compared with ECG of 07-Jul-2014 02:05,  Sinus rhythm has replaced Atrial fibrillation  Vent. rate has decreased BY  81 BPM  Criteria for Septal infarct are no longer Present  ST no longer depressed in Lateral leads  Nonspecific T wave abnormality no longer evident in Inferior leads  T wave inversion less evident in Anterior leads  ----------unconfirmed----------  Confirmed by OVERREAD, NOT (100), editor PEARSON, BARBARA ([id number]) on 07/08/2014 8:13:14 AM  ECG    Assessment/Plan:  Assessment/Plan:  Assessment IMP  chest pain and angina  paroxysmal atrial fibrillation  obesity  hypertension  shortness of breath  diabetes .   Plan PLAN  continue amiodarone for atrial fibrillation  agree with Coumadin therapy for anticoagulation  continue metoprolol for rate control  as needed  continue diabetes management and care  recommend weight loss and exercise 4 obesity  recommend the patient resume CPAP for obstructive sleep apnea  continue hypertension control  if Myoview is negative have the patient follow-up as an outpatient  recommend medical therapy   Electronic Signatures: Yolonda Kida (MD)  (Signed 27-Oct-15 16:55)  Authored: Chief Complaint, VITAL SIGNS/ANCILLARY NOTES, Brief Assessment, Lab Results, Radiology Results, Assessment/Plan   Last Updated: 27-Oct-15 16:55 by Yolonda Kida (MD)

## 2015-01-03 NOTE — Discharge Summary (Signed)
PATIENT NAME:  Deborah, Obrien MR#:  [id number] DATE OF BIRTH:  13-Mar-1941  DATE OF ADMISSION:  08/19/2014 DATE OF DISCHARGE:  08/20/2014  DISCHARGE DIAGNOSIS:  New onset nonvalvular atrial flutter with rapid ventricular rate causing shortness of breath.   PROCEDURE:  The patient underwent electrical cardioversion of atrial flutter to normal sinus rhythm without complications   HISTORY:  A 74 year old female with paroxysmal atrial flutter with rapid ventricular rate, having significant symptoms and needing admission to the hospital for conversion to normal sinus rhythm and symptom control. The patient was given amiodarone and metoprolol for heart rate control and was cardioverted to normal sinus rhythm without complication while on Coumadin with an INR of 3. The patient was feeling well after her cardioversion and felt well ambulating, on appropriate medication management and was discharged home in good condition.   DISCHARGE MEDICATIONS: Include pravastatin 20 mg each day, insulin injection as recorded, omeprazole 20 mg p.o. daily, metoprolol 25 mg b.i.d., oxybutynin daily, Paxil 25 mg each day, amiodarone 200 mg each day, warfarin 7.5 mg each day.   FOLLOWUP: She is to have a PT, INR in 3 to 4 days for further assessment and adjustments.    ____________________________ Corey Skains, MD [id number] D: 08/20/2014 13:03:35 ET T: 08/20/2014 17:04:06 ET JOB#: [id number]  cc: Corey Skains, MD, <Dictator> Corey Skains MD ELECTRONICALLY SIGNED 08/22/2014 7:53

## 2015-01-03 NOTE — Discharge Summary (Signed)
PATIENT NAME:  Deborah Obrien, Deborah Obrien MR#:  [id number] DATE OF BIRTH:  1940-12-14  DATE OF ADMISSION:  07/07/2014 DATE OF DISCHARGE:  07/09/2014   DISCHARGE DIAGNOSES: 1.  Unstable angina from chest pain created from rapid atrial fibrillation.  2.  Rapid atrial fibrillation.  3.  Morbid obesity.  4.  Major depression, in partial remission.  5.  Type 2 diabetes with vascular complications.  6.  Morbid obesity.   DISCHARGE MEDICATIONS:  Per [facility] medication reconciliation. Briefly, amiodarone 40 mg a day was added by cardiology to control heart rate. Therefore, we will lower her warfarin with the interaction from amiodarone to 5 mg daily and have this checked in 2 days with outpatient followup closely.   HISTORY AND PHYSICAL: Please see detailed history and physical done on admission.   HOSPITAL COURSE: The patient was admitted with chest pain, rapid atrial fibrillation to rate of 180-190 at home. She improved. She was here.  She had a dose of diltiazem and was back in sinus rhythm. We increased her metoprolol as she did get some bradycardia at times, then in the 40s when she was asleep. Amiodarone was added as noted, she can tolerate that. She had a Lexiscan Myoview that was normal without ischemia. She did not have any recurrent tachycardia. Ruled out for MI by enzymes. She had some hyperglycemia intermittently. Otherwise, her hospitalization was unremarkable. She did have a continuation of her renal disease with a GFR of approximately 37, which was around baseline. She had concomitant mild anemia from CKD.    ____________________________ Ocie Cornfield. Ouida Sills, MD [id number] D: 07/09/2014 07:32:49 ET T: 07/09/2014 08:56:44 ET JOB#: [id number]  cc: Ocie Cornfield. Ouida Sills, MD, <Dictator> [name] MD ELECTRONICALLY SIGNED 07/10/2014 13:17

## 2015-01-03 NOTE — Consult Note (Signed)
PATIENT NAME:  Deborah Obrien, Deborah Obrien MR#:  [id number] DATE OF BIRTH:  11/25/1940  DATE OF CONSULTATION:  07/07/2014  REFERRING PHYSICIAN:   CONSULTING PHYSICIAN:  Stephaine Breshears D. Clayborn Bigness, MD  PRIMARY CARE PHYSICIAN: Frazier Richards, MD  CARDIOLOGIST: Serafina Royals, MD  INDICATION: Chest pain and palpitations with atrial fibrillation.  HISTORY OF PRESENT ILLNESS: The patient is a 74 year old obese white female with hypertension, diabetes, renal insufficiency, and paroxysmal atrial fibrillation who presented to the Emergency Room with chest tightness and palpitations. She has a history of atrial fibrillation, has been treated with metoprolol for rate control in the past with episodes of bradycardia when she is in sinus. Chest tightness is what initially brought her to the Emergency Room. Pain in the mid chest without significant radiation. She has been having symptoms on and off for the last 2 weeks and actually saw Horris Latino in Dr. Alveria Apley office recently, scheduled for work-up on November 6th as an outpatient for noninvasive study. She has been maintained on metoprolol, has been doing reasonably well, but then the heart rate was 140 with unstable chest pain. She was given Cardizem which converted her rate down to the 50s and she was in sinus bradycardia. She denied any dizziness or blackout spells. No nausea, vomiting, or sweating. She was not really short of breath.   REVIEW OF SYSTEMS: No blackout spells or syncope. No weight loss. No weight gain. No hemoptysis or hematemesis. No bright red blood per rectum. No vision changes or hearing change. Denies sputum production or cough. She has had fatigue, weakness, chest pain, palpitations, and tachycardia.   PAST MEDICAL HISTORY: Paroxysmal A-fib, hypertension, diabetes, hyperlipidemia, hepatic steatosis, obstructive sleep apnea, osteoarthritis.   PAST SURGICAL HISTORY: Cholecystectomy, appendectomy, hysterectomy, C-spine surgery, lumbar spinal fusion,  lumbar laminectomy.   SOCIAL HISTORY: Retired, married. No recent smoking or alcohol consumption.   FAMILY HISTORY: Diabetes, hypertension and CKD.  ALLERGIES: NARCOTICS, ASPIRIN, BEXTRA, CELEBREX, CODEINE.  CURRENT MEDICATIONS: Azo cranberry 2 tablets once a day, calcium with vitamin D 2 tablets a day, Coumadin 10 mg 4 times a week and 7.5 3 times a week, Senna twice a day, Lantus 34 unit once a day, metoprolol 25 twice a day, multivitamin once a day, NovoLog subcutaneous, NovoLog subcutaneous 3 times a day sliding scale, omeprazole 20 mg 2 tablets a day, oxybutynin 10 mg a day, paroxetine 20 mg a day, Pravachol 20 a day, vitamin D 400 units daily.  PHYSICAL EXAMINATION: VITAL SIGNS: Blood pressure 120/60, pulse was 60, respiratory rate of 14, afebrile.  HEENT: Normocephalic, atraumatic. Pupils equal and reactive to light. NECK: Supple. No significant JVD, bruits or adenopathy. LUNGS: Clear to auscultation and percussion. No significant wheeze, rhonchi or rales. HEART: Bradycardic right now, regular, systolic ejection murmur at the apex. PMI nondisplaced.  ABDOMEN: Benign.  EXTREMITIES: Within normal limits. NEUROLOGIC: Intact. SKIN: Within normal limits.  DIAGNOSTIC DATA: Glucose of 216, BUN 30, creatinine 1.48, sodium 144, post 3.8, chloride 108. Troponin 0.02. White count 8.9, hemoglobin 9.9, platelet count 287,000. INR 2.1.   EKG: Initially atrial fibrillation, rate of 140, nonspecific finding.   Follow up EKG: Sinus brady, 55.  Chest x-ray: Otherwise negative.  ASSESSMENT:  1.  Chest pain. 2.  Possible unstable angina. 3.  Atrial fibrillation with rapid ventricular response.  4.  Diabetes. 5.  Adrenal insufficiency.  6.  Hypertension. 7.  Obstructive sleep apnea.  8.  Renal insufficiency. 9.  Degenerative joint disease. 10.  Gastroesophageal reflux disease.   PLAN: 1.  Agree  with admit. Rule out for myocardial infarction. Follow up cardiac enzymes. Follow up EKG. Treat  chest pain symptoms with nitrates as necessary and rate control with beta blockade. Consider Cardizem once  chest pain. Decide whether functional study or cardiac catheterization is indicated.  2.  Atrial fibrillation. Will probably switch from metoprolol and switch to amiodarone for better rhythm control. Since  adequate rate control, he is still   .Continue anticoagulation . 3.  Diabetes.  Continue  sliding scale. Followup hemoglobin A1c.  4.  Hypertension.  She was on metoprolol. May need to switch  Consider another agent to help with blood pressure management. May consider hydralazine and Imdur or possibly clonidine. Norvasc is Will try to refrain from ACE inhibitors, ARBs because of potential risk of further renal insufficiency event.  5.  Gastroesophageal reflux disease. Continue pantoprazole for reflux symptoms. 6.  For degenerative joint disease,  Again, we will discuss the case with Dr. Nehemiah Massed and Horris Latino. If her pain resolves, would probably consider medical therapy.  ____________________________ Loran Senters. Clayborn Bigness, MD ddc:sb D: 07/08/2014 08:38:00 ET T: 07/08/2014 09:59:36 ET JOB#: [id number]  cc: Jeorge Reister D. Clayborn Bigness, MD, <Dictator> Yolonda Kida MD ELECTRONICALLY SIGNED 08/11/2014 9:49

## 2015-01-03 NOTE — H&P (Signed)
PATIENT NAME:  Deborah Obrien, Deborah Obrien MR#:  [id number] DATE OF BIRTH:  [birth year]  DATE OF ADMISSION:  08/19/2014  CHIEF COMPLAINT: "I feel terrible."   HISTORY OF PRESENT ILLNESS: This is a 74 year old female with progressive shortness of breath, weakness over the last 3-4 days. The patient has had new onset of atrial fibrillation with rapid ventricular rate, not well controlled by beta blocker and after beta blocker dosages the patient has heart rate control in the 120 beat per minute range, but here has had lower blood pressure. She has had addition of amiodarone for which she has been placed on amiodarone over the last several weeks, but has had a need for electrical cardioversion due to significant symptoms. She is short of breath, weak and fatigued and has had an INR at 3.7 over the last several weeks. The patient has not had any apparent congestive heart failure and troponin levels have been normal without evidence of true angina.   REMAINDER OF REVIEW OF SYSTEMS: Negative for vision change, ringing in the ears, hearing loss, heartburn, nausea, vomiting, diarrhea, bloody stools, stomach pain, extremity pain, leg weakness, cramping in the buttocks, known blood clots, headaches, blackouts, dizzy spells, nosebleeds, congestion, trouble swallowing, frequent urination, urination at night, muscle weakness, numbness, anxiety, depression, skin lesions or skin rashes.   PAST MEDICAL HISTORY:  1.  Diabetes with complication of hypertension and renal dysfunction.  2.  Essential hypertension.  3.  Atrial fibrillation, nonvalvular and paroxysmal.   FAMILY HISTORY: No family members with early onset of cardiovascular disease or hypertension.   SOCIAL HISTORY: Currently denies alcohol or tobacco use.   ALLERGIES: As listed.   MEDICATIONS: As listed.  PHYSICAL EXAMINATION:  VITAL SIGNS: Blood pressure is 98/60 bilaterally. Heart rate is 120 and irregular.  GENERAL: She is a well-appearing female in no  acute distress.  HEENT: No icterus, thyromegaly, ulcers, hemorrhage or xanthelasma.  CARDIOVASCULAR: Irregularly irregular with normal S1 and S2. PMI is inferiorly displaced. Carotid upstroke normal without bruit. Jugular venous pressure not seen.  LUNGS: Have a few basilar crackles with normal respirations.  ABDOMEN: Soft, nontender. Cannot assess hepatosplenomegaly or masses due to increased abdominal girth.  EXTREMITIES: Show 2+ radial, femoral, trace dorsal pedal pulses, with 1+ lower extremity edema. No cyanosis, clubbing or ulcers.  NEUROLOGIC: She is oriented to time, place and person, with normal mood and affect.   ASSESSMENT: This is a 74 year old female with acute paroxysmal nonvalvular atrial fibrillation with essential hypertension, diabetes with complication with severe shortness of breath, weakness and fatigue needing further treatment options.   RECOMMENDATIONS:  1.  Admit to telemetry with telemetry unit nursing. 2.  Increase amiodarone as well as beta blocker for heart rate control.  3.  Continue Coumadin for goal INR between 2 to 3 for further risk reduction in stroke with atrial fibrillation.  4.  Diabetes medication management with insulin injection. 5.  Essential hypertension control with metoprolol.  6.  Proceed to electrical cardioversion of atrial fibrillation to normal sinus rhythm. The patient understands the risks and benefits of electrical cardioversion. These includes the possibility of death, stroke, heart attack, and medication side effects, other rhythm disturbances and the patient is at low risk for general anesthesia.   ____________________________ Corey Skains, MD bjk:TT D: 08/19/2014 17:44:29 ET T: 08/19/2014 20:40:11 ET JOB#: [id number]  cc: Corey Skains, MD, <Dictator> Corey Skains MD ELECTRONICALLY SIGNED 08/22/2014 7:53

## 2015-01-03 NOTE — H&P (Signed)
PATIENT NAME:  Deborah Obrien, Deborah Obrien MR#:  [id number] DATE OF BIRTH:  16-Mar-1941  DATE OF ADMISSION:  07/07/2014  PRIMARY CARE PHYSICIAN: Dr. Ouida Sills.  PRIMARY CARDIOLOGIST: Dr. Nehemiah Massed.   CHIEF COMPLAINT: Chest tightness, palpitations.   HISTORY OF PRESENTING ILLNESS: A 74 year old, Caucasian female patient, with history of hypertension, diabetes, CKD stage III, paroxysmal atrial fibrillation, presented to the hospital Emergency Room complaining of chest tightness and palpitations. The patient was found to have atrial fibrillation with rapid ventricular rate to the 140s. The patient's heart rate did slow down into the 50s, but continues to have chest tightness and is being admitted to hospitalist service to rule out acute coronary syndrome.   The patient had a similar feeling, although milder about 2 weeks back when she saw Dr. Alveria Apley PA. Was scheduled for an echocardiogram and stress test for November 6,  which she is waiting for at this point.   There is no recent change in medication. Does not drink alcohol. No caffeine. No thyroid problems. The patient initially was in the rate of 140s when she presented to the Emergency Room. Had 2 doses of IV Cardizem given, and presently on the Cardizem drip, the patient's heart rate is in the high 50s. She does not have any lightheadedness or dizziness.   REVIEW OF SYSTEMS:  CONSTITUTIONAL: Complains of fatigue and weakness.  EYES: No blurry vision or pain.  ENT: No tinnitus, ear pain, or hearing loss.  RESPIRATORY: No cough, wheeze or hemoptysis.  CARDIOVASCULAR: No chest pain, orthopnea or edema.  GASTROINTESTINAL: No nausea, vomiting, diarrhea or abdominal pain.  GENITOURINARY: No dysuria, hematuria or frequency.  ENDOCRINE: No polyuria, nocturia or thyroid problems. HEMATOLOGIC AND LYMPHATIC: No anemia, easy bruising or bleeding. INTEGUMENTARY: No acne, rash or lesion. MUSCULOSKELETAL: No back pain or arthritis.  NEUROLOGIC: No focal  numbness, weakness or seizure.  PSYCHIATRIC: No anxiety or depression.   PAST MEDICAL HISTORY: 1. Paroxysmal atrial fibrillation. 2. Hypertension.  3. Diabetes mellitus, type 2.  4. Hyperlipidemia.  5. Hepatic steatosis.  6. Obstructive sleep apnea, unable to tolerate CPAP.  7. Osteoarthritis.  8.   chronic right ear deafness.   PAST SURGICAL HISTORY: 1. Cholecystectomy. 2. Appendectomy.  3. Hysterectomy.  4. C-spine surgery. 5. Lumbar spinal fusion.  6. Lumbar laminectomy.   SOCIAL HISTORY: The patient does not smoke. No alcohol. No illicit drug use. The patient is married, lives with her husband at home.   FAMILY HISTORY: Mother had diabetes, hypertension, and CKD.   ALLERGIES: NARCOTICS, ASPIRIN, BEXTRA AND CELEBREX ALONG WITH CODEINE.   HOME MEDICATIONS:  1. Azo Cranberry 2 tablets oral once a day. 2. Calcium with vitamin D one tablet 2 times a day.  3. Coumadin 10 mg 4 times a week and 7.5 the other 3 days. 4. Docusate/senna 50/8.6 mg oral 2 times a day.  5. Lantus 34 units subcutaneously once a day. 6. Metoprolol 25 mg oral 2 times a day.  7. Multivitamin 1 tablet daily.  8. NovoLog subcutaneously 3 times a day before meals sliding scale.  9. Omeprazole 20 mg oral 1 capsule 2 times a day.  10. Oxybutynin 10 mg daily.  11. Paroxetine 20 mg daily. 12. Pravastatin 20 mg daily. 13. Vitamin D3 400 international units daily.   PHYSICAL EXAMINATION:  VITAL SIGNS: Temperature of 98. Initially pulse of 144, presently at 58. Blood pressure of 121/44, saturating 100% on 2 liters oxygen.  GENERAL: Obese Caucasian female patient lying in bed, overall seems comfortable, conversational, cooperative with  examination.  PSYCHIATRIC: Alert and oriented x 3. Mood and affect appropriate. Judgment intact.  HEENT: Atraumatic, normocephalic. Oral mucosa moist and pink. External ears and nose normal. No pallor. No icterus. Pupils are bilaterally equal and reactive to light.  NECK: Supple.  No thyromegaly. No palpable lymph nodes. Trachea midline. No carotid bruit or JVD.  CARDIOVASCULAR: S1, S2, without any murmurs, peripheral pulses 2+. No edema.  RESPIRATORY: Normal work of breathing. Has crackles at the bases.  GASTROINTESTINAL: Soft abdomen, nontender. Bowel sounds present. No organomegaly palpable.  SKIN: Warm and dry. No petechiae, rash or ulcers..  MUSCULOSKELETAL: No joint swelling, redness, or effusion in large joints. Normal muscle tone.  NEUROLOGICAL: Motor strength 5/5 in upper and lower extremities. Sensation is intact all over.  LYMPHATIC: No cervical lymphadenopathy.   LABORATORY STUDIES: Show glucose of 216, BUN 30, creatinine 1.48, sodium 144, potassium 3.8, chloride 108.   Troponin less than 0.02.   WBC 8.9, hemoglobin 9.9, platelets of 287,000.   INR 2.1.   EKG shows rate of 143 with no acute ST elevation found.   Chest x-ray, portable, showed cardiomegaly without any edema.   ASSESSMENT AND PLAN: 1. Atrial fibrillation with rapid ventricular rate. The patient's heart rate is much improved in the high 50s off Cardizem. I will increase the metoprolol dose from 25 b.i.d. to 50 b.i.d. at this point and monitor. Continue blood thinners.  2. Chest tightness. The patient had similar symptoms 2 weeks prior. Saw Dr. Alveria Apley PA and was scheduled for a stress test and echocardiogram on November 6 as an outpatient. At this point, considering the patient continues to have chest tightness in spite of heart rate being controlled, we will schedule her for an echocardiogram. Consult cardiology for further input. If she needs any further inpatient tests, probably this can be done in the office. We will check 2 more sets of cardiac enzymes. EKG does not show any ST elevation. Further management per the patient's progress.  3. Diabetes mellitus. Continue medications and sliding scale insulin.  4. Hypertension. Continue medications.  5. Chronic kidney disease III, stable.   6. Deep vein thrombosis prophylaxis. The patient is on Coumadin. INR therapeutic.   CODE STATUS: Full Code.   TIME SPENT ON THIS CASE: Was 45 minutes.    ____________________________ Leia Alf Roshonda Sperl, MD srs:JT D: 07/07/2014 13:24:57 ET T: 07/07/2014 14:50:55 ET JOB#: [id number]  cc: Alveta Heimlich R. Lilyanna Lunt, MD, <Dictator> Ocie Cornfield. Ouida Sills, MD Corey Skains, MD Neita Carp MD ELECTRONICALLY SIGNED 07/09/2014 10:50

## 2015-01-05 LAB — SURGICAL PATHOLOGY

## 2015-01-11 NOTE — Op Note (Signed)
PATIENT NAME:  Deborah Obrien, Deborah Obrien MR#:  [id number] DATE OF BIRTH:  [birth year]  DATE OF PROCEDURE:  10/27/2014  PREOPERATIVE DIAGNOSIS: Left shoulder impingement syndrome.   POSTOPERATIVE DIAGNOSIS: Left shoulder impingement syndrome.   PROCEDURE PERFORMED: Left subacromial decompression.   SURGEON: Skip Estimable, MD.   ANESTHESIA: General and interscalene block.   ESTIMATED BLOOD LOSS: 25 mL.   FLUIDS REPLACED: 1100 mL of crystalloid.   DRAINS: None.   INDICATIONS FOR SURGERY: The patient is a 74 year old female who has been seen for complaints of persistent left shoulder pain. MRI demonstrated findings suggestive of tear of the supraspinatus tendon with impingement anatomy. After discussion of the risks and benefits of surgical intervention, the patient expressed understanding of the risks and benefits and agreed with plans for surgical intervention.   PROCEDURE IN DETAIL: The patient was brought into the operating room and, after adequate interscalene block and general endotracheal anesthesia was achieved, the patient was placed in the modified beach chair position. The head was secured in headrest and all bony prominences were well padded. The patient's left shoulder and arm were cleaned and prepped with alcohol and DuraPrep and draped in the usual sterile fashion. A "timeout" was performed as per usual protocol. The anticipated incision site was injected with 0.25% Marcaine with epinephrine. An anterior oblique incision was made roughly bisecting the anterior aspect of the acromion. The fibers of the deltoid were split in line and a "deltoid on" approach was utilized by elevating the deltoid in a subperiosteal fashion off the acromion. The subdeltoid bursa was noted to be thickened and inflamed. Subdeltoid bursectomy was performed. A Darrach retractor was inserted so as to protect the rotator cuff. An osteotome was used to remove an anterior-inferior wafer of bone measuring approximately 3 mm  in thickness. Additional debridement and contouring was performed using a TPS high-speed rasp. The wound was irrigated with copious amounts of normal saline with antibiotic solution and then suctioned dry. Good decompression of the subacromial space was appreciated. The shoulder was placed through a range of motion and the cuff was examined. There was some fraying but no full-thickness tear was appreciated. 15 mL of 0.25% Marcaine with epinephrine was then injected in the joint and the shoulder again placed through a range of motion. There was no extravasation of fluid. An area of some thinning of the supraspinatus tendon was reinforced using number 1 Ethibond. The deltoid was repaired in side-to-side fashion using interrupted sutures of number 1 Ethibond. The subcutaneous tissue was approximated in layers using first number 0 Vicryl followed by number 2-0 Vicryl. The skin was closed with a running subcuticular suture of number 4-0 Vicryl. Steri-Strips were applied followed by application of ice wrap and a sling.   The patient tolerated the procedure well. She was transported to the recovery room in stable condition.    ____________________________ Laurice Record. Holley Bouche., MD jph:bu D: 10/27/2014 18:14:58 ET T: 10/27/2014 19:32:22 ET JOB#: [id number]  cc: Laurice Record. Holley Bouche., MD, <Dictator> JAMES P Holley Bouche MD ELECTRONICALLY SIGNED 10/27/2014 20:21

## 2015-01-12 DIAGNOSIS — M549 Dorsalgia, unspecified: Secondary | ICD-10-CM | POA: Insufficient documentation

## 2015-01-12 DIAGNOSIS — M109 Gout, unspecified: Secondary | ICD-10-CM | POA: Insufficient documentation

## 2015-01-13 ENCOUNTER — Ambulatory Visit: Payer: Medicare Other | Attending: Physical Medicine and Rehabilitation | Admitting: Physical Therapy

## 2015-01-13 DIAGNOSIS — M5417 Radiculopathy, lumbosacral region: Secondary | ICD-10-CM

## 2015-01-13 DIAGNOSIS — M5416 Radiculopathy, lumbar region: Secondary | ICD-10-CM | POA: Insufficient documentation

## 2015-01-13 NOTE — Therapy (Signed)
[facility] [address] [place], [address] Phone: [phone]   Fax:  [phone]  Physical Therapy Treatment  Patient Details  Name: Deborah Obrien MRN: [id number] Date of Birth: 11/06/1940 Referring Provider:  Sharlet Salina, MD  Encounter Date: 2015/01/24      PT End of Session - 01-24-15 1039    Visit Number 17   Number of Visits 21   Date for PT Re-Evaluation 01/27/15   Authorization Type 6   Authorization Time Period 10   Authorization - Visit Number 6   Authorization - Number of Visits 10   PT Start Time 0830   PT Stop Time 0915   PT Time Calculation (min) 45 min   Activity Tolerance Patient tolerated treatment well;No increased pain   Behavior During Therapy [facility] for tasks assessed/performed      No past medical history on file.  No past surgical history on file.  There were no vitals filed for this visit.  Visit Diagnosis:  Lumbosacral radiculitis      Subjective Assessment - 2015-01-24 1035    Subjective Patient reports that she is feeling much better   Patient Stated Goals To have decreased back pain and full independence.   Currently in Pain? No/denies   Multiple Pain Sites No            [facility] PT Assessment - 01-24-2015 0001    Assessment   Prior Therapy therapy for shoulder   Balance Screen   Has the patient fallen in the past 6 months No   [place] Private residence   Living Arrangements Spouse/significant other   Prior Function   Level of [place] with basic ADLs   Cognition   Overall Cognitive Status Within Functional Limits for tasks assessed   Bed Mobility   Bed Mobility Supine to Sit   Supine to Sit 4: Min assist              Adult Aquatic Therapy - 2015/01/24 1215    Aquatic Therapy Subjective   Subjective Patient reports she is feeling good. She feels like this is really helping a lot.    Treatment   Gait Ambulated forward, backward, side stepping x 2 laps each.    Exercises Standing exercises to include: Hip abduction, hip extension, squats, heel raises, push/pull with kickboard, lumbar rotation with kickboard each x 2 min. Seated exercises for core strengthening to include bicycles, abd.add, marching. x 2 min each                              PT Long Term Goals - 2015-01-24 1041    PT LONG TERM GOAL #1   Title Patient will be independent and rate pain at < 2/10 with all aggrivating activities.    Time 4   Period Weeks   Status On-going                 G-Codes - 01-24-2015 1043    Functional Limitation Mobility: Walking and moving around   Mobility: Walking and Moving Around Current Status [phone]) At least 80 percent but less than 100 percent impaired, limited or restricted   Mobility: Walking and Moving Around Goal Status [phone]) At least 1 percent but less than 20 percent impaired, limited or restricted      Problem List There are no active problems to display for this patient.  Daneshia Tavano, MPT, GCS 01/13/2015, 12:14 PM  [facility] [address], [place], [address] Phone: [phone]   Fax:  [phone]

## 2015-01-15 ENCOUNTER — Encounter: Payer: Self-pay | Admitting: Physical Therapy

## 2015-01-15 ENCOUNTER — Ambulatory Visit: Payer: Medicare Other | Admitting: Physical Therapy

## 2015-01-15 DIAGNOSIS — M5416 Radiculopathy, lumbar region: Secondary | ICD-10-CM | POA: Diagnosis not present

## 2015-01-15 DIAGNOSIS — M5417 Radiculopathy, lumbosacral region: Secondary | ICD-10-CM

## 2015-01-15 NOTE — Therapy (Signed)
[facility] [address], [place], [address] Phone: [phone]   Fax:  [phone]  Physical Therapy Treatment  Patient Details  Name: Deborah Obrien MRN: [id number] Date of Birth: Jul 27, 1941 Referring Provider:  Sharlet Salina, MD  Encounter Date: 01/15/2015      PT End of Session - 01/15/15 1114    Visit Number 18   Number of Visits 22   Date for PT Re-Evaluation 01/27/15   Authorization Type 7   Authorization Time Period 10   Authorization - Visit Number 7   Authorization - Number of Visits 10   PT Start Time 0845   PT Stop Time 0930   PT Time Calculation (min) 45 min   Activity Tolerance Patient tolerated treatment well;No increased pain   Behavior During Therapy [facility] for tasks assessed/performed      History reviewed. No pertinent past medical history.  History reviewed. No pertinent past surgical history.  There were no vitals filed for this visit.  Visit Diagnosis:  Lumbosacral radiculitis      Subjective Assessment - 01/15/15 1048    Subjective Patient reports no difficulties. No current back pain. Able to get in/out of car without trouble now.    Currently in Pain? No/denies                     Adult Aquatic Therapy - 01/15/15 1105    Aquatic Therapy Subjective   Subjective Patient reports she is having no pain. She is able to get into/out of car without trouble.    Treatment   Gait Ambulated forward, backward, side stepping x 2 laps each.    Exercises Standing exercises to include: Hip abduction, hip extension, squats, heel raises, push/pull with kickboard, lumbar rotation with kickboard each x 2 min. Seated exercises for core strengthening to include bicycles, abd.add, marching. x 2 min each                    PT Education - 01/15/15 1058    Education provided Yes   Person(s) Educated Patient   Methods Demonstration;Verbal cues   Comprehension  Verbalized understanding;Returned demonstration             PT Long Term Goals - 01/13/15 1041    PT LONG TERM GOAL #1   Title Patient will be independent and rate pain at < 2/10 with all aggrivating activities.    Time 4   Period Weeks   Status On-going               Plan - 01/15/15 1117    Clinical Impression Statement Patient is progressing very well with aquatic therapy. Her pain is resolved and is able to tolerate functional activities without difficulty.    Pt will benefit from skilled therapeutic intervention in order to improve on the following deficits Decreased activity tolerance   Rehab Potential Good   PT Frequency 2x / week   PT Treatment/Interventions Therapeutic exercise;Aquatic Therapy   Consulted and Agree with Plan of Care Patient        Problem List There are no active problems to display for this patient.   Kaleb Sek, PT 01/15/2015, 11:27 AM  [place] [facility] [address] [place], [address] Phone: [phone]   Fax:  [phone]

## 2015-01-19 DIAGNOSIS — M5136 Other intervertebral disc degeneration, lumbar region: Secondary | ICD-10-CM | POA: Insufficient documentation

## 2015-01-20 ENCOUNTER — Ambulatory Visit: Payer: Self-pay | Admitting: Physical Therapy

## 2015-01-20 ENCOUNTER — Encounter: Payer: Self-pay | Admitting: Physical Therapy

## 2015-01-20 ENCOUNTER — Ambulatory Visit: Payer: Medicare Other | Admitting: Physical Therapy

## 2015-01-20 DIAGNOSIS — M5416 Radiculopathy, lumbar region: Secondary | ICD-10-CM | POA: Diagnosis not present

## 2015-01-20 DIAGNOSIS — M5417 Radiculopathy, lumbosacral region: Secondary | ICD-10-CM

## 2015-01-20 DIAGNOSIS — R262 Difficulty in walking, not elsewhere classified: Secondary | ICD-10-CM

## 2015-01-20 DIAGNOSIS — R531 Weakness: Secondary | ICD-10-CM

## 2015-01-20 NOTE — Therapy (Signed)
[facility] [address] [place], [address] Phone: [phone]   Fax:  [phone]  Physical Therapy Treatment  Patient Details  Name: Deborah Obrien MRN: [id number] Date of Birth: 14-Mar-1941 Referring Provider:  Sharlet Salina, MD  Encounter Date: 01/20/2015      PT End of Session - 01/20/15 1253    Visit Number 19   Number of Visits 22   Date for PT Re-Evaluation 01/27/15   Authorization Type 8   Authorization Time Period 10   PT Start Time 1204   PT Stop Time 1243   PT Time Calculation (min) 39 min   Activity Tolerance Patient limited by fatigue;Patient limited by pain   Behavior During Therapy [place] for tasks assessed/performed      No past medical history on file.  Past Surgical History  Procedure Laterality Date  . Shoulder arthroscopy with subacromial decompression Left     There were no vitals filed for this visit.  Visit Diagnosis:  Lumbosacral radiculitis  Weakness generalized  Difficulty walking      Subjective Assessment - 01/20/15 1250    Subjective Patient reports her dog jumped up in her lap and she pulled something again in her low back/buttock region. She rates pain at 5/10.   Patient is accompained by: Family member   Patient Stated Goals To have decreased back pain and full independence, also to improve endurance.   Currently in Pain? Yes   Pain Score 5    Pain Location Buttocks   Pain Orientation Right   Pain Descriptors / Indicators Aching;Nagging   Pain Type Acute pain   Pain Onset Yesterday   Pain Frequency Constant   Aggravating Factors  flexing right hip or lifting right leg.   Multiple Pain Sites No                         [facility] Adult PT Treatment/Exercise - 01/20/15 0001    Ambulation/Gait   Ambulation/Gait Yes   Ambulation/Gait Assistance 7: Independent   Ambulation Distance (Feet) 750 Feet  in 5 min   Stairs Yes   Stairs  Assistance 6: Modified independent (Device/Increase time)  with rails   Stair Management Technique Two rails;Step to pattern   Number of Stairs 4   Exercises   Exercises Knee/Hip;Lumbar   Lumbar Exercises: Stretches   Lower Trunk Rotation 2 reps;20 seconds  seated   Knee/Hip Exercises: Stretches   Active Hamstring Stretch 20 seconds;2 reps   Knee/Hip Exercises: Standing   Other Standing Knee Exercises hip flexion, hip extension, hip abduction x 15 reps bilaterally                PT Education - 01/20/15 1253    Education provided Yes   Person(s) Educated Patient;Spouse   Methods Explanation;Demonstration;Verbal cues   Comprehension Returned demonstration;Verbalized understanding             PT Long Term Goals - 01/20/15 1257    PT LONG TERM GOAL #1   Title Patient will be independent and rate pain at < 2/10 with all aggrivating activities.    Time 4   Period Weeks   Status On-going   PT LONG TERM GOAL #2   Title Patient will be able to ambulate > 1000' in 6 minutes for improved independence and cardiovascular endurance.   Baseline 750' in 5 min   Time 4   Period Weeks   Status New  Plan - 01/30/2015 1255    Clinical Impression Statement Patient has new increased lumbar and buttock pain. Pt also demonstrates significantly limited endurance with activity.    Pt will benefit from skilled therapeutic intervention in order to improve on the following deficits Decreased activity tolerance;Decreased endurance;Decreased strength;Pain   Rehab Potential Good   PT Frequency 1x / week   PT Duration 4 weeks   PT Treatment/Interventions Therapeutic exercise;Gait training;Stair training;Patient/family education   PT Next Visit Plan focus on endurance training.   PT Home Exercise Plan pt will continue going to pool on her own for additional exercise and pain free mobility.   Consulted and Agree with Plan of Care Patient;Family member/caregiver           G-Codes - Jan 30, 2015 1300    Functional Assessment Tool Used 28mw   Functional Limitation Mobility: Walking and moving around   Mobility: Walking and Moving Around Current Status [phone]) At least 40 percent but less than 60 percent impaired, limited or restricted   Mobility: Walking and Moving Around Goal Status [phone]) At least 20 percent but less than 40 percent impaired, limited or restricted      Problem List There are no active problems to display for this patient.   Sydell Prowell, PT 01-30-2015, 1:03 PM  [facility] [address] [place], [address] Phone: [phone]   Fax:  [phone]

## 2015-01-22 ENCOUNTER — Ambulatory Visit: Payer: Self-pay | Admitting: Physical Therapy

## 2015-01-22 ENCOUNTER — Encounter: Payer: Medicare Other | Admitting: Physical Therapy

## 2015-01-26 ENCOUNTER — Encounter: Payer: Self-pay | Admitting: *Deleted

## 2015-01-27 ENCOUNTER — Ambulatory Visit: Payer: Medicare Other | Admitting: Anesthesiology

## 2015-01-27 ENCOUNTER — Ambulatory Visit
Admission: RE | Admit: 2015-01-27 | Discharge: 2015-01-27 | Disposition: A | Discharge status: Home / Self Care | Payer: Medicare Other | Source: Ambulatory Visit | Attending: Gastroenterology | Admitting: Gastroenterology

## 2015-01-27 ENCOUNTER — Encounter
Admission: RE | Disposition: A | Discharge status: Home / Self Care | Payer: Self-pay | Source: Ambulatory Visit | Attending: Gastroenterology

## 2015-01-27 DIAGNOSIS — K224 Dyskinesia of esophagus: Secondary | ICD-10-CM | POA: Diagnosis not present

## 2015-01-27 DIAGNOSIS — D500 Iron deficiency anemia secondary to blood loss (chronic): Secondary | ICD-10-CM | POA: Insufficient documentation

## 2015-01-27 DIAGNOSIS — E119 Type 2 diabetes mellitus without complications: Secondary | ICD-10-CM | POA: Diagnosis not present

## 2015-01-27 DIAGNOSIS — Z8673 Personal history of transient ischemic attack (TIA), and cerebral infarction without residual deficits: Secondary | ICD-10-CM | POA: Insufficient documentation

## 2015-01-27 DIAGNOSIS — I129 Hypertensive chronic kidney disease with stage 1 through stage 4 chronic kidney disease, or unspecified chronic kidney disease: Secondary | ICD-10-CM | POA: Diagnosis not present

## 2015-01-27 DIAGNOSIS — N189 Chronic kidney disease, unspecified: Secondary | ICD-10-CM | POA: Insufficient documentation

## 2015-01-27 DIAGNOSIS — K920 Hematemesis: Secondary | ICD-10-CM | POA: Insufficient documentation

## 2015-01-27 DIAGNOSIS — K317 Polyp of stomach and duodenum: Secondary | ICD-10-CM | POA: Diagnosis not present

## 2015-01-27 DIAGNOSIS — R195 Other fecal abnormalities: Secondary | ICD-10-CM | POA: Diagnosis not present

## 2015-01-27 DIAGNOSIS — Z6841 Body Mass Index (BMI) 40.0 and over, adult: Secondary | ICD-10-CM | POA: Diagnosis not present

## 2015-01-27 HISTORY — DX: Paroxysmal atrial fibrillation: I48.0

## 2015-01-27 HISTORY — PX: ESOPHAGOGASTRODUODENOSCOPY: SHX5428

## 2015-01-27 HISTORY — DX: Chronic kidney disease, stage 3 (moderate): N18.3

## 2015-01-27 HISTORY — DX: Morbid (severe) obesity due to excess calories: E66.01

## 2015-01-27 HISTORY — DX: Type 2 diabetes mellitus without complications: E11.9

## 2015-01-27 HISTORY — DX: Essential (primary) hypertension: I10

## 2015-01-27 HISTORY — DX: Chronic kidney disease, stage 3a: N18.31

## 2015-01-27 LAB — GLUCOSE, CAPILLARY: Glucose-Capillary: 275 mg/dL — ABNORMAL HIGH (ref 65–99)

## 2015-01-27 SURGERY — EGD (ESOPHAGOGASTRODUODENOSCOPY)
Anesthesia: General

## 2015-01-27 MED ORDER — PHENYLEPHRINE HCL 10 MG/ML IJ SOLN
INTRAMUSCULAR | Status: DC | PRN
Start: 2015-01-27 — End: 2015-01-27
  Administered 2015-01-27 (×4): 100 ug via INTRAVENOUS

## 2015-01-27 MED ORDER — PROPOFOL INFUSION 10 MG/ML OPTIME
INTRAVENOUS | Status: DC | PRN
  Administered 2015-01-27: 140 ug/kg/min via INTRAVENOUS

## 2015-01-27 MED ORDER — CLINDAMYCIN PHOSPHATE 600 MG/50ML IV SOLN
600.0000 mg | Freq: Once | INTRAVENOUS | Status: AC
  Administered 2015-01-27: 600 mg via INTRAVENOUS
  Filled 2015-01-27: qty 50

## 2015-01-27 MED ORDER — FENTANYL CITRATE (PF) 100 MCG/2ML IJ SOLN
INTRAMUSCULAR | Status: DC | PRN
  Administered 2015-01-27: 50 ug via INTRAVENOUS

## 2015-01-27 MED ORDER — MIDAZOLAM HCL 2 MG/2ML IJ SOLN
INTRAMUSCULAR | Status: DC | PRN
  Administered 2015-01-27: 1 mg via INTRAVENOUS

## 2015-01-27 MED ORDER — SODIUM CHLORIDE 0.9 % IV SOLN
INTRAVENOUS | Status: DC
  Administered 2015-01-27: 09:00:00 via INTRAVENOUS
  Administered 2015-01-27: 1000 mL via INTRAVENOUS
  Administered 2015-01-27: 09:00:00 via INTRAVENOUS

## 2015-01-27 MED ORDER — PROPOFOL 10 MG/ML IV BOLUS
INTRAVENOUS | Status: DC | PRN
  Administered 2015-01-27: 90 mg via INTRAVENOUS

## 2015-01-27 NOTE — Op Note (Signed)
[facility] Gastroenterology Patient Name: Deborah Obrien Procedure Date: 01/27/2015 8:30 AM MRN: [id number] Account #: [id number] Date of Birth: 1941/07/13 Admit Type: Outpatient Age: 74 Room: [facility] ENDO ROOM 3 Gender: Female Note Status: Finalized Procedure:         Upper GI endoscopy Indications:       Iron deficiency anemia secondary to chronic blood loss,                     Iron deficiency anemia, Coffee-ground emesis, Heme                     positive stool Providers:         Lollie Sails, MD Referring MD:      Ocie Cornfield. Ouida Sills, MD (Referring MD) Medicines:         Monitored Anesthesia Care Complications:     No immediate complications. Procedure:         Pre-Anesthesia Assessment:                    - ASA Grade Assessment: III - A patient with severe                     systemic disease.                    After obtaining informed consent, the endoscope was passed                     under direct vision. Throughout the procedure, the                     patient's blood pressure, pulse, and oxygen saturations                     were monitored continuously. The Olympus GIF-160 endoscope                     (S#. [id number]) was introduced through the mouth, and                     advanced to the third part of duodenum. The upper GI                     endoscopy was performed with moderate difficulty. The                     patient tolerated the procedure well. Findings:      Abnormal motility was noted in the middle third of the esophagus and in       the lower third of the esophagus. The cricopharyngeus was normal. There       are extra peristaltic waves of the esophageal body. Secondary and       tertiary peristaltic waves are noted.      The exam of the esophagus was otherwise normal.      There was evidence of fresh bleeding with a large amount of hematin       material throughout the vault. Of note, none of the 5 clips placed on       the  last proceedure remained. Multiple 1 to 10 mm pedunculated and       sessile polyps with bleeding and stigmata of recent bleeding were found       in the cardia, in the gastric body and  in the gastric antrum. These 7-8       polyps were removed with a hot snare. Resection and retrieval were       complete. Three hemostatic clips were successfully placed. There was no       bleeding at the end of the procedure. Hemostasis was good at all sites.      The examined duodenum was normal. Impression:        - Esophageal motility disorder.                    - Multiple gastric polypswith stigmata of bleeding.                     Resected and retrieved. Clips were placed.                    - Normal examined duodenum. Recommendation:    - Use Protonix (pantoprazole) 40 mg PO BID daily.                    - Use sucralfate tablets 1 gram PO QID daily.                    - Check hemogram with white blood cell count and platelets                     tonight. 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 All rights reserved. The codes documented in this report are preliminary and upon coder review may  be revised to meet current compliance requirements. Lollie Sails, MD 01/27/2015 10:32:11 AM This report has been signed electronically. Number of Addenda:  0 Note Initiated On: 01/27/2015 8:30 AM      [facility]

## 2015-01-27 NOTE — Transfer of Care (Signed)
Immediate Anesthesia Transfer of Care Note  Patient: Deborah Obrien  Procedure(s) Performed: Procedure(s): ESOPHAGOGASTRODUODENOSCOPY (EGD) (N/A)  Patient Location: PACU  Anesthesia Type:General  Level of Consciousness: awake and sedated  Airway & Oxygen Therapy: Patient Spontanous Breathing  Post-op Assessment: Report given to RN  Post vital signs: stable  Last Vitals:  Filed Vitals:   01/27/15 0836  BP: 124/50  Pulse: 54  Temp: 36.1 C  Resp: 7    Complications: No apparent anesthesia complications

## 2015-01-27 NOTE — Anesthesia Postprocedure Evaluation (Signed)
  Anesthesia Post-op Note  Patient: Deborah Obrien  Procedure(s) Performed: Procedure(s): ESOPHAGOGASTRODUODENOSCOPY (EGD) (N/A)  Anesthesia type:General  Patient location: PACU  Post pain: Pain level controlled  Post assessment: Post-op Vital signs reviewed, Patient's Cardiovascular Status Stable, Respiratory Function Stable, Patent Airway and No signs of Nausea or vomiting  Post vital signs: Reviewed and stable  Last Vitals:  Filed Vitals:   01/27/15 1050  BP: 88/51  Pulse: 56  Temp:   Resp: 17    Level of consciousness: awake, alert  and patient cooperative  Complications: No apparent anesthesia complications

## 2015-01-27 NOTE — H&P (Signed)
Outpatient short stay form Pre-procedure 01/27/2015 8:40 AM Lollie Sails MD  Primary Physician: Dr. Frazier Richards, Dr. Serafina Royals  Reason for visit:  Anemia and dyspepsia history of hematemesis  History of present illness:  Patient is presenting today for repeat upper scope in regards to a history of multiple bleeding lesions in her stomach. These are essentially irritated hyperplastic lesions spontaneously were bleeding under the influence of her anti-coagulant medications. He has been doing well. Hemoglobin has been relatively stable.    Current facility-administered medications:  .  0.9 %  sodium chloride infusion, , Intravenous, Continuous, Lollie Sails, MD .  clindamycin (CLEOCIN) IVPB 600 mg, 600 mg, Intravenous, Once, Lollie Sails, MD   Allergies  Allergen Reactions  . Cephalosporins Other (See Comments)    Other Reaction: Intolerance  . Hydrocodone-Acetaminophen Hives  . Macrolides And Ketolides Other (See Comments)    Other Reaction: Intolerance  . Meperidine Nausea Only and Other (See Comments)    SOB  . Other Nausea Only, Rash and Other (See Comments)    Uncoded Allergy. Allergen: NON-STEROIDS, Other Reaction: Not Assessed bextra - hands and feet swell Shellfish - SOB Shellfish - SOB  . Sulfa Antibiotics     Other reaction(s): Other (See Comments) Other Reaction: Intolerance  . Aspirin Nausea Only and Other (See Comments)    stomach pain  . Celecoxib Other (See Comments)    GI bleed  . Cephalexin Diarrhea and Nausea Only  . Erythromycin Nausea And Vomiting    GI Upset  . Hydromorphone Other (See Comments)    hypotension,easy overdose hypotension,easy overdose  . Iodinated Diagnostic Agents Rash    CKD  . Oxycodone Other (See Comments)    easy overdose  . Ambrosia Artemisiifolia (Ragweed) Skin Test     Other reaction(s): Other (See Comments) Other Reaction: Not Assessed  . Atorvastatin Nausea Only and Other (See Comments)    weakness   . Codeine Diarrhea and Nausea And Vomiting  . Prednisone     Other reaction(s): Other (See Comments) Blood sugar to go up.  . Valdecoxib      Past Medical History  Diagnosis Date  . Diabetes mellitus without complication   . Hypertension   . Morbid obesity   . Chronic kidney disease (CKD) stage G3a/A1, moderately decreased glomerular filtration rate (GFR) between 45-59 mL/min/1.73 square meter and albuminuria creatinine ratio less than 30 mg/g   . Paroxysmal atrial fibrillation     Review of systems:      Physical Exam    Heart and lungs: Regular rate and rhythm, clear to auscultation    HEENT: Normocephalic atraumatic    Other:     Pertinant exam for procedure: Soft, mild discomfort palpation in the epigastric region, nondistended bowel sounds positive normoactive. No masses rebound or organomegaly.    Planned proceedures: EGD and indicated procedures. I have discussed the risks benefits and complications of procedures to include not limited to bleeding, infection, perforation and the risk of sedation and the patient wishes to proceed.    Lollie Sails, MD Gastroenterology 01/27/2015  8:40 AM

## 2015-01-27 NOTE — Anesthesia Preprocedure Evaluation (Signed)
Anesthesia Evaluation  Patient identified by MRN, date of birth, ID band Patient awake    Reviewed: Allergy & Precautions, NPO status , Patient's Chart, lab work & pertinent test results, reviewed documented beta blocker date and time   Airway Mallampati: III  TM Distance: >3 FB Neck ROM: Full    Dental  (+) Upper Dentures, Missing   Pulmonary          Cardiovascular hypertension, Pt. on medications and Pt. on home beta blockers     Neuro/Psych TIA (S/p fall, no other symptoms)   GI/Hepatic   Endo/Other  diabetes, Poorly Controlled, Type 2, Insulin DependentMorbid obesity  Renal/GU Renal Insufficiency and CRFRenal disease     Musculoskeletal   Abdominal   Peds  Hematology  (+) anemia ,   Anesthesia Other Findings   Reproductive/Obstetrics                             Anesthesia Physical Anesthesia Plan  ASA: III  Anesthesia Plan: General   Post-op Pain Management:    Induction:   Airway Management Planned: Nasal Cannula  Additional Equipment:   Intra-op Plan:   Post-operative Plan:   Informed Consent: I have reviewed the patients History and Physical, chart, labs and discussed the procedure including the risks, benefits and alternatives for the proposed anesthesia with the patient or authorized representative who has indicated his/her understanding and acceptance.     Plan Discussed with:   Anesthesia Plan Comments:         Anesthesia Quick Evaluation

## 2015-01-27 NOTE — Anesthesia Procedure Notes (Signed)
Date/Time: 01/27/2015 8:53 AM Performed by: Nelda Marseille Pre-anesthesia Checklist: Patient identified, Emergency Drugs available, Suction available, Patient being monitored and Timeout performed Oxygen Delivery Method: Nasal cannula

## 2015-01-29 ENCOUNTER — Encounter: Payer: Medicare Other | Admitting: Physical Therapy

## 2015-01-29 LAB — SURGICAL PATHOLOGY

## 2015-02-03 ENCOUNTER — Encounter: Payer: Self-pay | Admitting: Gastroenterology

## 2015-02-05 ENCOUNTER — Ambulatory Visit: Payer: Medicare Other | Admitting: Physical Therapy

## 2015-02-05 DIAGNOSIS — R262 Difficulty in walking, not elsewhere classified: Secondary | ICD-10-CM

## 2015-02-05 DIAGNOSIS — M5416 Radiculopathy, lumbar region: Secondary | ICD-10-CM | POA: Diagnosis not present

## 2015-02-05 DIAGNOSIS — R531 Weakness: Secondary | ICD-10-CM

## 2015-02-05 NOTE — Therapy (Signed)
[facility] [address] [place], [address] Phone: [phone]   Fax:  [phone]  Physical Therapy Treatment  Patient Details  Name: Deborah Obrien MRN: [id number] Date of Birth: 08-11-1941 Referring Provider:  Sharlet Salina, MD  Encounter Date: 02/05/2015      PT End of Session - 02/05/15 1420    Visit Number 2   Number of Visits 4   Date for PT Re-Evaluation 02/17/15   Authorization Type 2   Authorization Time Period 10   PT Start Time 1350   PT Stop Time 1420   PT Time Calculation (min) 30 min   Activity Tolerance Patient limited by lethargy;Other (comment)  patient not feeling well   Behavior During Therapy [facility] for tasks assessed/performed      Past Medical History  Diagnosis Date  . Diabetes mellitus without complication   . Hypertension   . Morbid obesity   . Chronic kidney disease (CKD) stage G3a/A1, moderately decreased glomerular filtration rate (GFR) between 45-59 mL/min/1.73 square meter and albuminuria creatinine ratio less than 30 mg/g   . Paroxysmal atrial fibrillation     Past Surgical History  Procedure Laterality Date  . Shoulder arthroscopy with subacromial decompression Left   . Esophagogastroduodenoscopy N/A 01/27/2015    Procedure: ESOPHAGOGASTRODUODENOSCOPY (EGD);  Surgeon: Lollie Sails, MD;  Location: [facility] ENDOSCOPY;  Service: Endoscopy;  Laterality: N/A;    There were no vitals filed for this visit.  Visit Diagnosis:  Weakness generalized  Difficulty walking      Subjective Assessment - 02/05/15 1418    Subjective Patient reports she has some chest congestion today. Cough. Otherwise reports she has no back pain.    Patient is accompained by: Family member   Limitations House hold activities;Walking   Patient Stated Goals To have decreased back pain and full independence, also to improve endurance.   Currently in Pain? No/denies   Pain Score 0-No  pain       treatment to include: Standing exercises: heel raises, marching, hip abduction, hip extension x20 each Step ups on 8 " step bilaterally x10 Nu step level 2 x 10 min for a total of 794 steps. Pt's HR and Pulse Ox checked during session due to sob and coughing. HR up to 79,  Pulse Ox to 95%.            PT Education - 02/05/15 1420    Education provided Yes   Person(s) Educated Patient   Methods Verbal cues   Comprehension Verbalized understanding;Returned demonstration;Verbal cues required             PT Long Term Goals - 01/20/15 1257    PT LONG TERM GOAL #1   Title Patient will be independent and rate pain at < 2/10 with all aggrivating activities.    Time 4   Period Weeks   Status On-going   PT LONG TERM GOAL #2   Title Patient will be able to ambulate > 1000' in 6 minutes for improved independence and cardiovascular endurance.   Baseline 750' in 5 min   Time 4   Period Weeks   Status New               Plan - 02/05/15 1428    Clinical Impression Statement Patient tolerated treatment fair. She was not feeling well, therefore session shortened.    Pt will benefit from skilled therapeutic intervention in order to improve on the following deficits Decreased  activity tolerance   Rehab Potential Good   PT Frequency 1x / week   PT Duration 4 weeks   PT Treatment/Interventions Therapeutic exercise;Gait training;Stair training;Patient/family education   PT Next Visit Plan focus on endurance training.   PT Home Exercise Plan pt will continue going to pool on her own for additional exercise and pain free mobility.   Consulted and Agree with Plan of Care Patient        Problem List There are no active problems to display for this patient.   Ronav Furney, PT  02/05/2015, 2:31 PM  [place] [facility] [address] [place], [address] Phone: [phone]   Fax:   [phone]

## 2015-02-12 ENCOUNTER — Ambulatory Visit: Payer: Medicare Other | Admitting: Physical Therapy

## 2015-02-19 ENCOUNTER — Ambulatory Visit: Payer: Medicare Other | Admitting: Physical Therapy

## 2015-02-20 ENCOUNTER — Encounter: Payer: Self-pay | Admitting: Emergency Medicine

## 2015-02-20 ENCOUNTER — Emergency Department: Payer: Medicare Other

## 2015-02-20 ENCOUNTER — Inpatient Hospital Stay
Admission: EM | Admit: 2015-02-20 | Discharge: 2015-02-25 | DRG: 389 | Disposition: A | Discharge status: Skilled Nursing Facility | Payer: Medicare Other | Attending: Internal Medicine | Admitting: Internal Medicine

## 2015-02-20 DIAGNOSIS — K565 Intestinal adhesions [bands] with obstruction (postprocedural) (postinfection): Secondary | ICD-10-CM | POA: Diagnosis not present

## 2015-02-20 DIAGNOSIS — I209 Angina pectoris, unspecified: Secondary | ICD-10-CM | POA: Diagnosis present

## 2015-02-20 DIAGNOSIS — Z888 Allergy status to other drugs, medicaments and biological substances status: Secondary | ICD-10-CM

## 2015-02-20 DIAGNOSIS — F329 Major depressive disorder, single episode, unspecified: Secondary | ICD-10-CM | POA: Diagnosis present

## 2015-02-20 DIAGNOSIS — N183 Chronic kidney disease, stage 3 (moderate): Secondary | ICD-10-CM | POA: Diagnosis present

## 2015-02-20 DIAGNOSIS — Z6841 Body Mass Index (BMI) 40.0 and over, adult: Secondary | ICD-10-CM

## 2015-02-20 DIAGNOSIS — K922 Gastrointestinal hemorrhage, unspecified: Secondary | ICD-10-CM | POA: Diagnosis present

## 2015-02-20 DIAGNOSIS — Z886 Allergy status to analgesic agent status: Secondary | ICD-10-CM

## 2015-02-20 DIAGNOSIS — E669 Obesity, unspecified: Secondary | ICD-10-CM | POA: Diagnosis present

## 2015-02-20 DIAGNOSIS — Z881 Allergy status to other antibiotic agents status: Secondary | ICD-10-CM

## 2015-02-20 DIAGNOSIS — Z79899 Other long term (current) drug therapy: Secondary | ICD-10-CM

## 2015-02-20 DIAGNOSIS — R609 Edema, unspecified: Secondary | ICD-10-CM | POA: Diagnosis present

## 2015-02-20 DIAGNOSIS — Z885 Allergy status to narcotic agent status: Secondary | ICD-10-CM | POA: Diagnosis not present

## 2015-02-20 DIAGNOSIS — I480 Paroxysmal atrial fibrillation: Secondary | ICD-10-CM | POA: Diagnosis present

## 2015-02-20 DIAGNOSIS — F419 Anxiety disorder, unspecified: Secondary | ICD-10-CM | POA: Diagnosis present

## 2015-02-20 DIAGNOSIS — Z91041 Radiographic dye allergy status: Secondary | ICD-10-CM | POA: Diagnosis not present

## 2015-02-20 DIAGNOSIS — Z7982 Long term (current) use of aspirin: Secondary | ICD-10-CM

## 2015-02-20 DIAGNOSIS — K5669 Other intestinal obstruction: Secondary | ICD-10-CM | POA: Diagnosis present

## 2015-02-20 DIAGNOSIS — K219 Gastro-esophageal reflux disease without esophagitis: Secondary | ICD-10-CM | POA: Diagnosis present

## 2015-02-20 DIAGNOSIS — R0600 Dyspnea, unspecified: Secondary | ICD-10-CM | POA: Diagnosis not present

## 2015-02-20 DIAGNOSIS — K56609 Unspecified intestinal obstruction, unspecified as to partial versus complete obstruction: Secondary | ICD-10-CM | POA: Diagnosis present

## 2015-02-20 DIAGNOSIS — E119 Type 2 diabetes mellitus without complications: Secondary | ICD-10-CM | POA: Diagnosis present

## 2015-02-20 DIAGNOSIS — E875 Hyperkalemia: Secondary | ICD-10-CM | POA: Diagnosis not present

## 2015-02-20 DIAGNOSIS — Z794 Long term (current) use of insulin: Secondary | ICD-10-CM

## 2015-02-20 DIAGNOSIS — Z882 Allergy status to sulfonamides status: Secondary | ICD-10-CM

## 2015-02-20 DIAGNOSIS — E785 Hyperlipidemia, unspecified: Secondary | ICD-10-CM | POA: Diagnosis present

## 2015-02-20 DIAGNOSIS — R0602 Shortness of breath: Secondary | ICD-10-CM

## 2015-02-20 DIAGNOSIS — I129 Hypertensive chronic kidney disease with stage 1 through stage 4 chronic kidney disease, or unspecified chronic kidney disease: Secondary | ICD-10-CM | POA: Diagnosis present

## 2015-02-20 DIAGNOSIS — K5660 Unspecified intestinal obstruction: Principal | ICD-10-CM | POA: Diagnosis present

## 2015-02-20 DIAGNOSIS — Z9049 Acquired absence of other specified parts of digestive tract: Secondary | ICD-10-CM | POA: Diagnosis present

## 2015-02-20 LAB — CBC WITH DIFFERENTIAL/PLATELET
Basophils Absolute: 0.1 10*3/uL (ref 0–0.1)
Basophils Relative: 1 %
Eosinophils Absolute: 0.1 10*3/uL (ref 0–0.7)
Eosinophils Relative: 1 %
HCT: 32.9 % — ABNORMAL LOW (ref 35.0–47.0)
Hemoglobin: 10.2 g/dL — ABNORMAL LOW (ref 12.0–16.0)
LYMPHS ABS: 2.4 10*3/uL (ref 1.0–3.6)
Lymphocytes Relative: 35 %
MCH: 24.5 pg — ABNORMAL LOW (ref 26.0–34.0)
MCHC: 31 g/dL — ABNORMAL LOW (ref 32.0–36.0)
MCV: 79.1 fL — AB (ref 80.0–100.0)
MONO ABS: 0.8 10*3/uL (ref 0.2–0.9)
MONOS PCT: 11 %
NEUTROS ABS: 3.5 10*3/uL (ref 1.4–6.5)
Neutrophils Relative %: 52 %
Platelets: 243 10*3/uL (ref 150–440)
RBC: 4.16 MIL/uL (ref 3.80–5.20)
RDW: 18.2 % — AB (ref 11.5–14.5)
WBC: 6.7 10*3/uL (ref 3.6–11.0)

## 2015-02-20 LAB — COMPREHENSIVE METABOLIC PANEL
ALBUMIN: 3.4 g/dL — AB (ref 3.5–5.0)
ALT: 18 U/L (ref 14–54)
AST: 28 U/L (ref 15–41)
Alkaline Phosphatase: 77 U/L (ref 38–126)
Anion gap: 9 (ref 5–15)
BUN: 42 mg/dL — ABNORMAL HIGH (ref 6–20)
CO2: 25 mmol/L (ref 22–32)
CREATININE: 2.09 mg/dL — AB (ref 0.44–1.00)
Calcium: 8.4 mg/dL — ABNORMAL LOW (ref 8.9–10.3)
Chloride: 104 mmol/L (ref 101–111)
GFR calc Af Amer: 26 mL/min — ABNORMAL LOW (ref >60)
GFR calc non Af Amer: 22 mL/min — ABNORMAL LOW (ref >60)
Glucose, Bld: 193 mg/dL — ABNORMAL HIGH (ref 65–99)
Potassium: 4 mmol/L (ref 3.5–5.1)
Sodium: 138 mmol/L (ref 135–145)
Total Bilirubin: 1.1 mg/dL (ref 0.3–1.2)
Total Protein: 6.7 g/dL (ref 6.5–8.1)

## 2015-02-20 LAB — LIPASE, BLOOD: Lipase: 45 U/L (ref 22–51)

## 2015-02-20 LAB — TROPONIN I

## 2015-02-20 MED ORDER — ONDANSETRON HCL 4 MG/2ML IJ SOLN
4.0000 mg | Freq: Four times a day (QID) | INTRAMUSCULAR | Status: DC | PRN
  Administered 2015-02-21 – 2015-02-22 (×4): 4 mg via INTRAVENOUS
  Filled 2015-02-20 (×4): qty 2

## 2015-02-20 MED ORDER — MORPHINE SULFATE 2 MG/ML IJ SOLN
2.0000 mg | Freq: Once | INTRAMUSCULAR | Status: AC
  Administered 2015-02-20: 2 mg via INTRAVENOUS

## 2015-02-20 MED ORDER — AMIODARONE HCL 200 MG PO TABS
200.0000 mg | ORAL_TABLET | Freq: Every day | ORAL | Status: DC
  Administered 2015-02-21 – 2015-02-25 (×5): 200 mg via ORAL
  Filled 2015-02-20 (×5): qty 1

## 2015-02-20 MED ORDER — HEPARIN SODIUM (PORCINE) 5000 UNIT/ML IJ SOLN
5000.0000 [IU] | Freq: Three times a day (TID) | INTRAMUSCULAR | Status: DC
  Administered 2015-02-20 – 2015-02-25 (×13): 5000 [IU] via SUBCUTANEOUS
  Filled 2015-02-20 (×12): qty 1

## 2015-02-20 MED ORDER — MORPHINE SULFATE 4 MG/ML IJ SOLN: 4.0000 mg | Freq: Once | INTRAMUSCULAR | Status: DC

## 2015-02-20 MED ORDER — OXYBUTYNIN CHLORIDE ER 10 MG PO TB24
10.0000 mg | ORAL_TABLET | Freq: Every day | ORAL | Status: DC
  Administered 2015-02-21 – 2015-02-24 (×3): 10 mg via ORAL
  Filled 2015-02-20 (×5): qty 1

## 2015-02-20 MED ORDER — PHENOL 1.4 % MT LIQD
1.0000 | OROMUCOSAL | Status: DC | PRN
  Filled 2015-02-20: qty 177

## 2015-02-20 MED ORDER — ONDANSETRON HCL 4 MG/2ML IJ SOLN
4.0000 mg | Freq: Once | INTRAMUSCULAR | Status: AC
  Administered 2015-02-20: 4 mg via INTRAVENOUS

## 2015-02-20 MED ORDER — HEPARIN SODIUM (PORCINE) 5000 UNIT/ML IJ SOLN
INTRAMUSCULAR | Status: AC
  Filled 2015-02-20: qty 1

## 2015-02-20 MED ORDER — PANTOPRAZOLE SODIUM 40 MG IV SOLR
INTRAVENOUS | Status: AC
  Filled 2015-02-20: qty 40

## 2015-02-20 MED ORDER — INSULIN ASPART 100 UNIT/ML ~~LOC~~ SOLN
0.0000 [IU] | Freq: Three times a day (TID) | SUBCUTANEOUS | Status: DC
  Administered 2015-02-21: 2 [IU] via SUBCUTANEOUS
  Administered 2015-02-21 (×2): 5 [IU] via SUBCUTANEOUS
  Administered 2015-02-22: 11 [IU] via SUBCUTANEOUS
  Administered 2015-02-22: 10 [IU] via SUBCUTANEOUS
  Administered 2015-02-22: 5 [IU] via SUBCUTANEOUS
  Administered 2015-02-23: 3 [IU] via SUBCUTANEOUS
  Administered 2015-02-23: 5 [IU] via SUBCUTANEOUS
  Administered 2015-02-23: 3 [IU] via SUBCUTANEOUS
  Administered 2015-02-24 (×2): 2 [IU] via SUBCUTANEOUS
  Administered 2015-02-25: 5 [IU] via SUBCUTANEOUS
  Administered 2015-02-25: 3 [IU] via SUBCUTANEOUS
  Filled 2015-02-20: qty 2
  Filled 2015-02-20: qty 5
  Filled 2015-02-20: qty 3
  Filled 2015-02-20: qty 2
  Filled 2015-02-20: qty 5
  Filled 2015-02-20: qty 2
  Filled 2015-02-20 (×2): qty 3
  Filled 2015-02-20: qty 5
  Filled 2015-02-20: qty 11
  Filled 2015-02-20: qty 5
  Filled 2015-02-20: qty 10
  Filled 2015-02-20: qty 5

## 2015-02-20 MED ORDER — MORPHINE SULFATE 2 MG/ML IJ SOLN
INTRAMUSCULAR | Status: AC
  Administered 2015-02-20: 2 mg via INTRAVENOUS
  Filled 2015-02-20: qty 2

## 2015-02-20 MED ORDER — FENTANYL CITRATE (PF) 100 MCG/2ML IJ SOLN
50.0000 ug | Freq: Once | INTRAMUSCULAR | Status: AC
  Administered 2015-02-20: 50 ug via INTRAVENOUS

## 2015-02-20 MED ORDER — SODIUM CHLORIDE 0.9 % IV BOLUS (SEPSIS)
1000.0000 mL | Freq: Once | INTRAVENOUS | Status: AC
  Administered 2015-02-20: 1000 mL via INTRAVENOUS

## 2015-02-20 MED ORDER — PAROXETINE HCL 20 MG PO TABS
20.0000 mg | ORAL_TABLET | Freq: Every day | ORAL | Status: DC
  Administered 2015-02-21 – 2015-02-25 (×5): 20 mg via ORAL
  Filled 2015-02-20 (×5): qty 1

## 2015-02-20 MED ORDER — ONDANSETRON HCL 4 MG/2ML IJ SOLN
INTRAMUSCULAR | Status: AC
  Administered 2015-02-20: 4 mg via INTRAVENOUS
  Filled 2015-02-20: qty 2

## 2015-02-20 MED ORDER — MORPHINE SULFATE 2 MG/ML IJ SOLN
2.0000 mg | INTRAMUSCULAR | Status: DC | PRN
  Administered 2015-02-21 (×2): 4 mg via INTRAVENOUS
  Filled 2015-02-20 (×2): qty 2

## 2015-02-20 MED ORDER — ONDANSETRON HCL 4 MG/2ML IJ SOLN: 4.0000 mg | Freq: Once | INTRAMUSCULAR | Status: AC

## 2015-02-20 MED ORDER — FENTANYL CITRATE (PF) 100 MCG/2ML IJ SOLN
INTRAMUSCULAR | Status: AC
  Filled 2015-02-20: qty 2

## 2015-02-20 MED ORDER — PANTOPRAZOLE SODIUM 40 MG IV SOLR
40.0000 mg | Freq: Every day | INTRAVENOUS | Status: DC
  Administered 2015-02-20 – 2015-02-24 (×5): 40 mg via INTRAVENOUS
  Filled 2015-02-20 (×4): qty 40

## 2015-02-20 MED ORDER — METOPROLOL TARTRATE 25 MG PO TABS
12.5000 mg | ORAL_TABLET | Freq: Two times a day (BID) | ORAL | Status: DC
  Administered 2015-02-21 (×2): 12.5 mg via ORAL
  Filled 2015-02-20 (×2): qty 1

## 2015-02-20 MED ORDER — SODIUM CHLORIDE 0.9 % IV SOLN
INTRAVENOUS | Status: DC
  Administered 2015-02-21 – 2015-02-24 (×7): via INTRAVENOUS

## 2015-02-20 MED ORDER — MENTHOL 3 MG MT LOZG
1.0000 | LOZENGE | OROMUCOSAL | Status: DC | PRN
  Filled 2015-02-20: qty 9

## 2015-02-20 NOTE — ED Provider Notes (Addendum)
[facility] Emergency Department Provider Note  Time seen: 9:02 PM  I have reviewed the triage vital signs and the nursing notes.   HISTORY  Chief Complaint Abdominal Pain    HPI Deborah Obrien is a 74 y.o. female with a past medical history of diabetes, hypertension, obesity, stage III chronic kidney disease, atrial fibrillation, prior bowel obstructions, who presents to the emergency department with approximately 6-7 hours of mid to upper abdominal pain, nausea and vomiting. Patient describes the abdominal pain as severe. Denies dysuria, hematuria, black or bloody stool or vomit. She states a normal bowel movement today. Patient has had similar pain in the past with a bowel obstruction.No modifying factors identified by the patient.    Past Medical History  Diagnosis Date  . Diabetes mellitus without complication   . Hypertension   . Morbid obesity   . Chronic kidney disease (CKD) stage G3a/A1, moderately decreased glomerular filtration rate (GFR) between 45-59 mL/min/1.73 square meter and albuminuria creatinine ratio less than 30 mg/g   . Paroxysmal atrial fibrillation     There are no active problems to display for this patient.   Past Surgical History  Procedure Laterality Date  . Shoulder arthroscopy with subacromial decompression Left   . Esophagogastroduodenoscopy N/A 01/27/2015    Procedure: ESOPHAGOGASTRODUODENOSCOPY (EGD);  Surgeon: Lollie Sails, MD;  Location: [facility] ENDOSCOPY;  Service: Endoscopy;  Laterality: N/A;    Current Outpatient Rx  Name  Route  Sig  Dispense  Refill  . acetaminophen (TYLENOL) 500 MG tablet   Oral   Take 1,000 mg by mouth every 6 (six) hours as needed for moderate pain.         Marland Kitchen amiodarone (PACERONE) 200 MG tablet   Oral   Take 200 mg by mouth daily.         Marland Kitchen aspirin EC 81 MG tablet   Oral   Take 81 mg by mouth daily.         . Calcium Carb-Cholecalciferol (CALCIUM 600 + D PO)   Oral   Take  1 tablet by mouth 2 (two) times daily.         . Cranberry-Vitamin C-Vitamin E 4200-20-3 MG-MG-UNIT CAPS   Oral   Take 2 tablets by mouth 2 (two) times daily.         Marland Kitchen docusate sodium (COLACE) 100 MG capsule   Oral   Take 200 mg by mouth at bedtime.         . furosemide (LASIX) 40 MG tablet   Oral   Take 20 mg by mouth daily.         . hydrocortisone 2.5 % cream   Topical   Apply 1 application topically at bedtime.         . insulin aspart (NOVOLOG) 100 UNIT/ML injection   Subcutaneous   Inject into the skin 3 (three) times daily with meals.          . insulin glargine (LANTUS) 100 UNIT/ML injection   Subcutaneous   Inject 38 Units into the skin at bedtime.          Marland Kitchen ketoconazole (NIZORAL) 2 % cream   Topical   Apply 1 application topically 3 (three) times daily.         . metoprolol tartrate (LOPRESSOR) 25 MG tablet   Oral   Take 0.5 tablets by mouth 2 (two) times daily.         . miconazole (MICOTIN) 2 % powder  Topical   Apply 1 application topically 2 (two) times daily.         . mirabegron ER (MYRBETRIQ) 25 MG TB24 tablet   Oral   Take 25 mg by mouth at bedtime.         . Multiple Vitamin (MULTI-VITAMINS) TABS   Oral   Take 1 tablet by mouth daily.         . pantoprazole (PROTONIX) 40 MG tablet   Oral   Take 40 mg by mouth 2 (two) times daily.         Marland Kitchen PARoxetine (PAXIL) 20 MG tablet   Oral   Take 20 mg by mouth daily.         . potassium chloride (K-DUR) 10 MEQ tablet   Oral   Take 1 tablet by mouth every morning.         . pravastatin (PRAVACHOL) 20 MG tablet   Oral   Take 1 tablet by mouth every evening.         . sucralfate (CARAFATE) 1 G tablet   Oral   Take 1 tablet by mouth 4 (four) times daily -  before meals and at bedtime.         . vitamin D, CHOLECALCIFEROL, 400 UNITS tablet   Oral   Take 1-2 tablets by mouth 2 (two) times daily. 2 tabs in the morning and 1 at night            Allergies Cephalosporins; Hydrocodone-acetaminophen; Macrolides and ketolides; Meperidine; Other; Sulfa antibiotics; Aspirin; Celecoxib; Cephalexin; Erythromycin; Hydromorphone; Iodinated diagnostic agents; Oxycodone; Ambrosia artemisiifolia (ragweed) skin test; Atorvastatin; Codeine; Prednisone; and Valdecoxib  No family history on file.  Social History History  Substance Use Topics  . Smoking status: Never Smoker   . Smokeless tobacco: Not on file  . Alcohol Use: Not on file    Review of Systems Constitutional: Negative for fever. Cardiovascular: Negative for chest pain. Respiratory: Negative for shortness of breath. Gastrointestinal: Positive for mid to upper abdominal pain, nausea and vomiting. Negative diarrhea. Genitourinary: Negative for dysuria. Negative for hematuria. Musculoskeletal: Negative for back pain. 10-point ROS otherwise negative.  ____________________________________________   PHYSICAL EXAM:  VITAL SIGNS: ED Triage Vitals  Enc Vitals Group     BP 02/20/15 1958 113/47 mmHg     Pulse Rate 02/20/15 1958 51     Resp 02/20/15 1958 24     Temp 02/20/15 1958 98.3 F (36.8 C)     Temp Source 02/20/15 1958 Oral     SpO2 02/20/15 1958 100 %     Weight 02/20/15 1958 276 lb (125.193 kg)     Height 02/20/15 1958 5\' 4"  (1.626 m)     Head Cir --      Peak Flow --      Pain Score 02/20/15 1959 8     Pain Loc --      Pain Edu? --      Excl. in [place]? --     Constitutional: Alert and oriented. Moderate distress, actively vomiting in the emergency department. Eyes: Normal exam ENT   Head: Normocephalic and atraumatic   Mouth/Throat: Mucous membranes are moist. Cardiovascular: Normal rate, regular rhythm. No murmur Respiratory: Normal respiratory effort without tachypnea nor retractions. Breath sounds are clear  Gastrointestinal: Moderate abdominal tenderness to palpation in the mid to upper abdomen, somewhat worse on the left side. No CVA tenderness  palpation. No rebound or guarding. Musculoskeletal: Nontender with normal range of motion in all extremities Neurologic:  Normal speech and language. No gross focal neurologic deficits  Skin:  Skin is warm, dry and intact.  Psychiatric: Mood and affect are normal. Speech and behavior are normal. ____________________________________________     RADIOLOGY  CT consistent with small bowel obstruction  ____________________________________________    INITIAL IMPRESSION / ASSESSMENT AND PLAN / ED COURSE  Pertinent labs & imaging results that were available during my care of the patient were reviewed by me and considered in my medical decision making (see chart for details).  Patient with active nausea and vomiting in the emergency department, moderate/severe mid to upper abdominal pain. Concern for possible bowel obstruction. We'll check labs, treat pain and nausea, IV hydrate, and monitor in the emergency department. We'll likely require a CT scan to further evaluate. Patient with chronic kidney disease so we will have to avoid IV contrast, actively vomiting and cannot tolerate by mouth contrast, we'll obtain a noncontrasted abdominal CT.   CT consistent with small bowel obstruction. I discussed this with the patient. We'll place an NG tube. Continue with pain management. I discussed with Dr. Rexene Edison will be down to see the patient.  EKG reviewed and interpreted by myself shows sinus bradycardia at 59 bpm, narrow QRS, normal axis, normal intervals, nonspecific ST changes present, no ST elevations noted. ____________________________________________   FINAL CLINICAL IMPRESSION(S) / ED DIAGNOSES  Abdominal pain Nausea and vomiting Small bowel obstruction   Harvest Dark, MD 02/20/15 2246  Harvest Dark, MD 02/20/15 2322

## 2015-02-20 NOTE — H&P (Signed)
General Surgery Admission Note  S: 74 yo F with history of multiple prior abdominal surgeries, DM and Atrial fibrillation who presents with 1 day of acute onset abdominal pain and nausea/vomiting.  Pain began suddenly today, diffuse, feeling currently "like she is going to explode".  Colicky.  + nausea/vomiting 2-3 episodes.  Last BM today which was normal.  No current flatus.  No sick contacts, no unusual ingestions.  No fevers/chills, night sweats, shortness of breath, cough, chest pain, dysuria/hematuria.  Active Ambulatory Problems    Diagnosis Date Noted  . No Active Ambulatory Problems   Resolved Ambulatory Problems    Diagnosis Date Noted  . No Resolved Ambulatory Problems   Past Medical History  Diagnosis Date  . Diabetes mellitus without complication   . Hypertension   . Morbid obesity   . Chronic kidney disease (CKD) stage G3a/A1, moderately decreased glomerular filtration rate (GFR) between 45-59 mL/min/1.73 square meter and albuminuria creatinine ratio less than 30 mg/g   . Paroxysmal atrial fibrillation    -H/o GI bleed from gastric polyps - s/p appendectomy - s/p cholecystectomy - s/p hysterectomy - s/p sigmoid colectomy - s/p ex lap for SBO - h/o nonoperative SBO    Medication List    ASK your doctor about these medications        acetaminophen 500 MG tablet  Commonly known as:  TYLENOL  Take 1,000 mg by mouth every 6 (six) hours as needed for moderate pain.     amiodarone 200 MG tablet  Commonly known as:  PACERONE  Take 200 mg by mouth daily.     aspirin EC 81 MG tablet  Take 81 mg by mouth daily.     CALCIUM 600 + D PO  Take 1 tablet by mouth 2 (two) times daily.     Cranberry-Vitamin C-Vitamin E 4200-20-3 MG-MG-UNIT Caps  Take 2 tablets by mouth 2 (two) times daily.     docusate sodium 100 MG capsule  Commonly known as:  COLACE  Take 200 mg by mouth at bedtime.     furosemide 40 MG tablet  Commonly known as:  LASIX  Take 20 mg by mouth daily.      hydrocortisone 2.5 % cream  Apply 1 application topically at bedtime.     insulin aspart 100 UNIT/ML injection  Commonly known as:  novoLOG  Inject into the skin 3 (three) times daily with meals.     insulin glargine 100 UNIT/ML injection  Commonly known as:  LANTUS  Inject 38 Units into the skin at bedtime.     ketoconazole 2 % cream  Commonly known as:  NIZORAL  Apply 1 application topically 3 (three) times daily.     metoprolol tartrate 25 MG tablet  Commonly known as:  LOPRESSOR  Take 0.5 tablets by mouth 2 (two) times daily.     miconazole 2 % powder  Commonly known as:  MICOTIN  Apply 1 application topically 2 (two) times daily.     MULTI-VITAMINS Tabs  Take 1 tablet by mouth daily.     oxybutynin 10 MG 24 hr tablet  Commonly known as:  DITROPAN-XL  Take 10 mg by mouth at bedtime.     pantoprazole 40 MG tablet  Commonly known as:  PROTONIX  Take 40 mg by mouth 2 (two) times daily.     PARoxetine 20 MG tablet  Commonly known as:  PAXIL  Take 20 mg by mouth daily.     potassium chloride 10 MEQ tablet  Commonly known as:  K-DUR  Take 1 tablet by mouth every morning.     pravastatin 20 MG tablet  Commonly known as:  PRAVACHOL  Take 1 tablet by mouth every evening.     sucralfate 1 G tablet  Commonly known as:  CARAFATE  Take 1 tablet by mouth 4 (four) times daily -  before meals and at bedtime.     vitamin D (CHOLECALCIFEROL) 400 UNITS tablet  Take 1-2 tablets by mouth 2 (two) times daily. 2 tabs in the morning and 1 at night       History   Social History  . Marital Status: Married    Spouse Name: N/A  . Number of Children: N/A  . Years of Education: N/A   Occupational History  . Not on file.   Social History Main Topics  . Smoking status: Never Smoker   . Smokeless tobacco: Not on file  . Alcohol Use: No  . Drug Use: Not on file  . Sexual Activity: Yes   Other Topics Concern  . Not on file   Social History Narrative   Allergies   Allergen Reactions  . Cephalosporins Other (See Comments)    Other Reaction: Intolerance  . Hydrocodone-Acetaminophen Hives  . Macrolides And Ketolides Other (See Comments)    Other Reaction: Intolerance  . Meperidine Nausea Only and Other (See Comments)    SOB  . Other Nausea Only, Rash and Other (See Comments)    Uncoded Allergy. Allergen: NON-STEROIDS, Other Reaction: Not Assessed bextra - hands and feet swell Shellfish - SOB Shellfish - SOB  . Sulfa Antibiotics     Other reaction(s): Other (See Comments) Other Reaction: Intolerance  . Aspirin Nausea Only and Other (See Comments)    stomach pain  . Celecoxib Other (See Comments)    GI bleed  . Cephalexin Diarrhea and Nausea Only  . Erythromycin Nausea And Vomiting    GI Upset  . Hydromorphone Other (See Comments)    hypotension,easy overdose hypotension,easy overdose  . Iodinated Diagnostic Agents Rash    CKD  . Oxycodone Other (See Comments)    easy overdose  . Ambrosia Artemisiifolia (Ragweed) Skin Test     Other reaction(s): Other (See Comments) Other Reaction: Not Assessed  . Atorvastatin Nausea Only and Other (See Comments)    weakness  . Codeine Diarrhea and Nausea And Vomiting  . Prednisone     Other reaction(s): Other (See Comments) Blood sugar to go up.  . Valdecoxib    ROS: Full ROS obtained, pertinent positives and negatives as above  Blood pressure 139/49, pulse 59, temperature 98.3 F (36.8 C), temperature source Oral, resp. rate 18, height 5\' 4"  (1.626 m), weight 125.193 kg (276 lb), SpO2 95 %.  GEN: NAD/A&Ox3 FACE: no obvious facial trauma, normal external nose, normal external ears EYES: no scleral icterus, no conjunctivitis HEAD: normocephalic atraumatic CV: RRR, no MRG RESP: moving air well, lungs clear ABD: soft, nontender, mild distention, obese EXT: moving all ext well, strength 5/5 NEURO: cnII-XII grossly intact, sensation intact all 4 ext  Labs: All labs reviewed, pertinent labs as  follows WBC 6.7, 52% neutrophils BUN 42, Cr 2.09 (baseline approx 1.5)  CT personally reviewed  - dilated proximal small bowel and collapsed distal, consistent with SBO.  A/P 74 yo F with multiple medical issues and multiple prior surgeries who presents with SBO.  + BM today.  Admit for IVF, NG tube.  Will consult IM and cardiology to help with patient comorbidities.  No acute surgical indications at this time.

## 2015-02-20 NOTE — Initial Assessments (Signed)
Patient reports started having right side pain earlier today.  Pain increasing over the day.  Also reports vomiting and urinary urgency.

## 2015-02-20 NOTE — ED Notes (Signed)
Pt with CT. 

## 2015-02-21 ENCOUNTER — Encounter: Payer: Self-pay | Admitting: *Deleted

## 2015-02-21 ENCOUNTER — Inpatient Hospital Stay: Payer: Medicare Other

## 2015-02-21 LAB — BASIC METABOLIC PANEL
ANION GAP: 10 (ref 5–15)
BUN: 41 mg/dL — ABNORMAL HIGH (ref 6–20)
CALCIUM: 8.1 mg/dL — AB (ref 8.9–10.3)
CHLORIDE: 106 mmol/L (ref 101–111)
CO2: 27 mmol/L (ref 22–32)
Creatinine, Ser: 2 mg/dL — ABNORMAL HIGH (ref 0.44–1.00)
GFR calc Af Amer: 27 mL/min — ABNORMAL LOW (ref >60)
GFR calc non Af Amer: 23 mL/min — ABNORMAL LOW (ref >60)
Glucose, Bld: 213 mg/dL — ABNORMAL HIGH (ref 65–99)
POTASSIUM: 4.4 mmol/L (ref 3.5–5.1)
Sodium: 143 mmol/L (ref 135–145)

## 2015-02-21 LAB — URINALYSIS COMPLETE WITH MICROSCOPIC (ARMC ONLY)
BACTERIA UA: NONE SEEN
Bilirubin Urine: NEGATIVE
Glucose, UA: NEGATIVE mg/dL
Hgb urine dipstick: NEGATIVE
Ketones, ur: NEGATIVE mg/dL
Nitrite: NEGATIVE
Protein, ur: NEGATIVE mg/dL
Specific Gravity, Urine: 1.015 (ref 1.005–1.030)
pH: 5 (ref 5.0–8.0)

## 2015-02-21 LAB — CBC
HEMATOCRIT: 30.7 % — AB (ref 35.0–47.0)
Hemoglobin: 9.3 g/dL — ABNORMAL LOW (ref 12.0–16.0)
MCH: 24.3 pg — ABNORMAL LOW (ref 26.0–34.0)
MCHC: 30.4 g/dL — ABNORMAL LOW (ref 32.0–36.0)
MCV: 80 fL (ref 80.0–100.0)
PLATELETS: 231 10*3/uL (ref 150–440)
RBC: 3.83 MIL/uL (ref 3.80–5.20)
RDW: 18.2 % — ABNORMAL HIGH (ref 11.5–14.5)
WBC: 8.3 10*3/uL (ref 3.6–11.0)

## 2015-02-21 LAB — GLUCOSE, CAPILLARY
GLUCOSE-CAPILLARY: 214 mg/dL — AB (ref 65–99)
GLUCOSE-CAPILLARY: 203 mg/dL — AB (ref 65–99)
Glucose-Capillary: 238 mg/dL — ABNORMAL HIGH (ref 65–99)
Glucose-Capillary: 150 mg/dL — ABNORMAL HIGH (ref 65–99)
Glucose-Capillary: 208 mg/dL — ABNORMAL HIGH (ref 65–99)

## 2015-02-21 MED ORDER — PHENOL 1.4 % MT LIQD
2.0000 | OROMUCOSAL | Status: DC | PRN
Start: 2015-02-21 — End: 2015-02-25
  Administered 2015-02-22: 2 via OROMUCOSAL
  Filled 2015-02-21: qty 177

## 2015-02-21 MED ORDER — MORPHINE SULFATE 4 MG/ML IJ SOLN
3.0000 mg | INTRAMUSCULAR | Status: DC | PRN
  Administered 2015-02-21 – 2015-02-22 (×5): 4 mg via INTRAVENOUS
  Administered 2015-02-22: 2 mg via INTRAVENOUS
  Administered 2015-02-22: 4 mg via INTRAVENOUS
  Filled 2015-02-21 (×8): qty 1

## 2015-02-21 MED ORDER — MORPHINE SULFATE 4 MG/ML IJ SOLN
4.0000 mg | Freq: Once | INTRAMUSCULAR | Status: AC
  Administered 2015-02-21: 4 mg via INTRAVENOUS
  Filled 2015-02-21: qty 1

## 2015-02-21 MED ORDER — INSULIN GLARGINE 100 UNIT/ML ~~LOC~~ SOLN
20.0000 [IU] | Freq: Every day | SUBCUTANEOUS | Status: DC
  Administered 2015-02-21 – 2015-02-22 (×2): 20 [IU] via SUBCUTANEOUS
  Filled 2015-02-21 (×4): qty 0.2

## 2015-02-21 NOTE — Progress Notes (Signed)
Subjective:   Fairly severe abdominal pain (that is controlled with moderately high doses of morphine). No flatus, no bowel movement. No increase in abdominal distention. Nasogastric output following is fairly low but it is yellowish brown in character.  Vital signs in last 24 hours: Temp:  [98.2 F (36.8 C)-98.5 F (36.9 C)] 98.2 F (36.8 C) (06/11 0735) Pulse Rate:  [51-66] 66 (06/11 1013) Resp:  [18-24] 24 (06/11 0010) BP: (113-140)/(40-70) 119/49 mmHg (06/11 0735) SpO2:  [95 %-100 %] 96 % (06/11 0735) Weight:  [125.193 kg (276 lb)-126.78 kg (279 lb 8 oz)] 126.78 kg (279 lb 8 oz) (06/11 0045) Last BM Date: 02/20/15  Intake/Output from previous day: 06/10 0701 - 06/11 0700 In: 0  Out: 150   Exam:  Abdomen is obese, soft, nontender, with no tympany. It is not particularly distended.  Lab Results:  CBC  Recent Labs  02/20/15 2047 02/21/15 0431  WBC 6.7 8.3  HGB 10.2* 9.3*  HCT 32.9* 30.7*  PLT 243 231   CMP     Component Value Date/Time   NA 143 02/21/2015 0431   NA 141 10/16/2014 1135   K 4.4 02/21/2015 0431   K 4.6 10/16/2014 1135   CL 106 02/21/2015 0431   CL 108* 10/16/2014 1135   CO2 27 02/21/2015 0431   CO2 27 10/16/2014 1135   GLUCOSE 213* 02/21/2015 0431   GLUCOSE 97 10/16/2014 1135   BUN 41* 02/21/2015 0431   BUN 25* 10/16/2014 1135   CREATININE 2.00* 02/21/2015 0431   CREATININE 1.35* 10/16/2014 1135   CALCIUM 8.1* 02/21/2015 0431   CALCIUM 8.5 10/16/2014 1135   PROT 6.7 02/20/2015 2047   PROT 8.0 10/29/2012 1100   ALBUMIN 3.4* 02/20/2015 2047   ALBUMIN 3.9 10/29/2012 1100   AST 28 02/20/2015 2047   AST 47* 10/29/2012 1100   ALT 18 02/20/2015 2047   ALT 27 10/29/2012 1100   ALKPHOS 77 02/20/2015 2047   ALKPHOS 156* 10/29/2012 1100   BILITOT 1.1 02/20/2015 2047   GFRNONAA 23* 02/21/2015 0431   GFRNONAA 35* 10/29/2012 1100   GFRAA 27* 02/21/2015 0431   GFRAA 41* 10/29/2012 1100   PT/INR No results for input(s): LABPROT, INR in the last 72  hours.  Studies/Results: Ct Abdomen Pelvis Wo Contrast  02/20/2015   CLINICAL DATA:  Severe abdominal pain, nausea and vomiting.  EXAM: CT ABDOMEN AND PELVIS WITHOUT CONTRAST  TECHNIQUE: Multidetector CT imaging of the abdomen and pelvis was performed following the standard protocol without IV contrast.  COMPARISON:  10/29/2012  FINDINGS: There is abnormal dilatation of proximal small bowel with abrupt transition to decompressed small bowel in the mid abdomen just to the left of midline. The transition point is probably about mid jejunum. The appearances are consistent with a low-moderate grade small bowel obstruction. No mass or focal inflammatory change is evident. There is no ascites. There is no extraluminal air. Colon is unremarkable, except for partial sigmoidectomy with unremarkable anastomosis. I believe there has been an appendectomy.  There is prior cholecystectomy. The liver and bile ducts appear normal on this unenhanced scan. There also are unremarkable unenhanced appearances of the pancreas, spleen, adrenals and kidneys. There is prior hysterectomy. No adnexal abnormalities are evident.  There is no significant abnormality in the lower chest. There is stable linear scarring in the left lung base.  There is no significant musculoskeletal lesion. There is prior posterior decompression with instrumented fusion from L4 through S1.  IMPRESSION: 1. Low-moderate grade small bowel obstruction,  approximately mid jejunum, with transition point in the mid abdomen just to the left of midline. 2. No other acute findings are evident. 3. Prior hysterectomy, cholecystectomy, sigmoidectomy and appendectomy. Unremarkable colonic anastomosis in the low pelvic midline.   Electronically Signed   By: Andreas Newport M.D.   On: 02/20/2015 21:52   Dg Abd 2 Views  02/21/2015   CLINICAL DATA:  Intestinal obstruction.  Abdominal pain.  EXAM: ABDOMEN - 2 VIEW  COMPARISON:  CT abdomen and pelvis 02/20/2015  FINDINGS: An  enteric tube has been placed with tip projecting over the proximal to mid gastric body. No intraperitoneal free air is identified. An air-fluid levels present in the stomach. There is mild dilatation of several small bowel loops in the left abdomen, similar in diameter to those in this region on the prior CT. There is a moderate amount of stool in the colon. Surgical clips are present in the right upper quadrant and in the pelvis. Prior lumbosacral spinal fusion is noted. Scarring is noted in the basilar left lower lobe.  IMPRESSION: 1. Mild small bowel dilatation in the left abdomen, similar to recent CT and compatible with relatively low-grade obstruction as described on that study. 2. Enteric tube in the gastric body.   Electronically Signed   By: Logan Bores   On: 02/21/2015 08:10    Assessment/Plan: Partial small bowel obstruction with minimal but dark nasogastric output. My review of the x-rays today show stool in the right colon with minimal gas throughout the abdomen. Continue nasogastric suction and reassess every day.

## 2015-02-21 NOTE — Progress Notes (Signed)
Initial Nutrition Assessment  DOCUMENTATION CODES:     INTERVENTION:     NUTRITION DIAGNOSIS:  Inadequate oral intake related to altered GI function as evidenced by NPO status.    GOAL:  Patient will meet greater than or equal to 90% of their needs    MONITOR:   (Energy intake, Digestive system)  REASON FOR ASSESSMENT:  Malnutrition Screening Tool    ASSESSMENT:  Pt admitted with SBO, NG tube in place. Pt with increased abdominal pain this am, RN Santiago Glad aware  Past Medical History  Diagnosis Date  . Diabetes mellitus without complication   . Hypertension   . Morbid obesity   . Chronic kidney disease (CKD) stage G3a/A1, moderately decreased glomerular filtration rate (GFR) between 45-59 mL/min/1.73 square meter and albuminuria creatinine ratio less than 30 mg/g   . Paroxysmal atrial fibrillation      Current Nutrition: NPO   Nutrition Prior to Admission Pt reports intake up and down for the past month.  Not feeling well, in pain and unable to gather more information   Labs:  Electrolyte and Renal Profile:  Recent Labs Lab 02/20/15 2047 02/21/15 0431  BUN 42* 41*  CREATININE 2.09* 2.00*  NA 138 143  K 4.0 4.4   Glucose Profile:  Recent Labs  02/20/15 2220 02/21/15 0721  GLUCAP 238* 214*     Medications: NS at 53ml/hr   Physical  Findings:  Unable to complete Nutrition-Focused physical exam at this time.    Weight Change: Noted 1% weight loss in the last month per recent wt encounters   Height:  Ht Readings from Last 1 Encounters:  02/21/15 5\' 4"  (1.626 m)    Weight:  Wt Readings from Last 1 Encounters:  02/21/15 279 lb 8 oz (126.78 kg)     Wt Readings from Last 10 Encounters:  02/21/15 279 lb 8 oz (126.78 kg)  01/27/15 276 lb (125.193 kg)    BMI:  Body mass index is 47.95 kg/(m^2).  Estimated Nutritional Needs:  Kcal:  Using IBW of 55kg (BEE 1050 kcals (IF 1.0-1.2, AF 1.3) [id number] kcals/d.   Protein:  (1.0-1.2  g/d) 55-66 g/d  Fluid:  (25-13ml/kg) 1375-167ml/d  Skin:  Reviewed, no issues  Diet Order:  Diet NPO time specified Except for: Ice Chips  EDUCATION NEEDS:  No education needs identified at this time   Intake/Output Summary (Last 24 hours) at 02/21/15 0906 Last data filed at 02/21/15 0735  Gross per 24 hour  Intake    573 ml  Output    650 ml  Net    -77 ml    Last BM:  6/10 per flowsheet  MODERATE Care Level Raiana Pharris B. Zenia Resides, [place], [place] (pager)

## 2015-02-21 NOTE — Consult Note (Signed)
Reason for Consult: atrial fibrillation Referring Physician:  Dr Rexene Edison  General surgery  primary cardiologist Dr. Wadie Lessen COLETA Deborah Obrien is an 74 y.o. female.  HPI:  73 year old obese female with a history of atrial fibrillation chronically on amiodarone had to have Coumadin discontinued because of bleeding from her stomach about a month ago. The patient last saw Dr. Nehemiah Massed about a month ago when the Coumadin was discontinued for atrial fibrillation has been rate control and rhythm control in she has done reasonably well part got admitted with intestinal obstruction is now having significant abdominal pain but no new cardiac issues. Patient denies any chest pain shortness of breath or leg swelling will continue to follow patient's.  Past Medical History  Diagnosis Date  . Diabetes mellitus without complication   . Hypertension   . Morbid obesity   . Chronic kidney disease (CKD) stage G3a/A1, moderately decreased glomerular filtration rate (GFR) between 45-59 mL/min/1.73 square meter and albuminuria creatinine ratio less than 30 mg/g   . Paroxysmal atrial fibrillation     Past Surgical History  Procedure Laterality Date  . Shoulder arthroscopy with subacromial decompression Left   . Esophagogastroduodenoscopy N/A 01/27/2015    Procedure: ESOPHAGOGASTRODUODENOSCOPY (EGD);  Surgeon: Lollie Sails, MD;  Location: [facility] ENDOSCOPY;  Service: Endoscopy;  Laterality: N/A;  . Colon surgery      History reviewed. No pertinent family history.  Social History:  reports that she has never smoked. She does not have any smokeless tobacco history on file. She reports that she does not drink alcohol. Her drug history is not on file.  Allergies:  Allergies  Allergen Reactions  . Cephalosporins Other (See Comments)    Other Reaction: Intolerance  . Hydrocodone-Acetaminophen Hives  . Macrolides And Ketolides Other (See Comments)    Other Reaction: Intolerance  . Meperidine Nausea Only  and Other (See Comments)    SOB  . Other Nausea Only, Rash and Other (See Comments)    Uncoded Allergy. Allergen: NON-STEROIDS, Other Reaction: Not Assessed bextra - hands and feet swell Shellfish - SOB Shellfish - SOB  . Sulfa Antibiotics     Other reaction(s): Other (See Comments) Other Reaction: Intolerance  . Aspirin Nausea Only and Other (See Comments)    stomach pain  . Celecoxib Other (See Comments)    GI bleed  . Cephalexin Diarrhea and Nausea Only  . Erythromycin Nausea And Vomiting    GI Upset  . Hydromorphone Other (See Comments)    hypotension,easy overdose hypotension,easy overdose  . Iodinated Diagnostic Agents Rash    CKD  . Oxycodone Other (See Comments)    easy overdose  . Ambrosia Artemisiifolia (Ragweed) Skin Test     Other reaction(s): Other (See Comments) Other Reaction: Not Assessed  . Atorvastatin Nausea Only and Other (See Comments)    weakness  . Codeine Diarrhea and Nausea And Vomiting  . Prednisone     Other reaction(s): Other (See Comments) Blood sugar to go up.  . Valdecoxib     Medications:  Prior to Admission:  Prescriptions prior to admission  Medication Sig Dispense Refill Last Dose  . acetaminophen (TYLENOL) 500 MG tablet Take 1,000 mg by mouth every 6 (six) hours as needed for moderate pain.   unknown at unknown  . amiodarone (PACERONE) 200 MG tablet Take 200 mg by mouth daily.   02/20/2015 at 0800  . Calcium Carb-Cholecalciferol (CALCIUM 600 + D PO) Take 1 tablet by mouth 2 (two) times daily.   02/20/2015 at Unknown  time  . Cranberry-Vitamin C-Vitamin E 4200-20-3 MG-MG-UNIT CAPS Take 2 tablets by mouth 2 (two) times daily.   02/20/2015 at Unknown time  . docusate sodium (COLACE) 100 MG capsule Take 200 mg by mouth at bedtime.   02/19/2015 at Unknown time  . furosemide (LASIX) 40 MG tablet Take 20 mg by mouth daily.   02/20/2015 at Unknown time  . hydrocortisone 2.5 % cream Apply 1 application topically at bedtime. Apply to rash under breast    02/19/2015 at Unknown time  . insulin aspart (NOVOLOG) 100 UNIT/ML injection Inject 15-35 Units into the skin 3 (three) times daily with meals. Blood glucose 0-150= 15 units 151-200= 17 units 201-250= 19 units 251-300= 23 units 301-350= 27 units 351-400= 31 units 401-up= 35 units   02/20/2015 at Unknown time  . insulin detemir (LEVEMIR) 100 UNIT/ML injection Inject 40 Units into the skin at bedtime.   02/19/2015 at Unknown time  . insulin glargine (LANTUS) 100 UNIT/ML injection Inject 38 Units into the skin at bedtime.      Marland Kitchen ketoconazole (NIZORAL) 2 % cream Apply 1 application topically every 2 (two) hours. Apply to rash under breast   02/20/2015 at Unknown time  . metoprolol tartrate (LOPRESSOR) 25 MG tablet Take 0.5 tablets by mouth 2 (two) times daily.   02/20/2015 at 0800  . Multiple Vitamin (MULTI-VITAMINS) TABS Take 1 tablet by mouth daily.   02/20/2015 at Unknown time  . oxybutynin (DITROPAN-XL) 10 MG 24 hr tablet Take 10 mg by mouth at bedtime.   02/19/2015 at Unknown time  . pantoprazole (PROTONIX) 40 MG tablet Take 40 mg by mouth 2 (two) times daily.   02/20/2015 at Unknown time  . PARoxetine (PAXIL) 20 MG tablet Take 20 mg by mouth daily.   02/20/2015 at Unknown time  . potassium chloride (K-DUR) 10 MEQ tablet Take 1 tablet by mouth every morning.   02/20/2015 at Unknown time  . pravastatin (PRAVACHOL) 20 MG tablet Take 1 tablet by mouth every evening.   02/19/2015 at Unknown time  . sucralfate (CARAFATE) 1 G tablet Take 1 tablet by mouth 4 (four) times daily -  before meals and at bedtime.   02/20/2015 at Unknown time  . vitamin D, CHOLECALCIFEROL, 400 UNITS tablet Take 1-2 tablets by mouth 2 (two) times daily. 2 tabs in the morning and 1 at night   02/20/2015 at Unknown time    Results for orders placed or performed during the hospital encounter of 02/20/15 (from the past 48 hour(s))  CBC with Differential     Status: Abnormal   Collection Time: 02/20/15  8:47 PM  Result Value Ref Range   WBC  6.7 3.6 - 11.0 K/uL   RBC 4.16 3.80 - 5.20 MIL/uL   Hemoglobin 10.2 (L) 12.0 - 16.0 g/dL   HCT 32.9 (L) 35.0 - 47.0 %   MCV 79.1 (L) 80.0 - 100.0 fL   MCH 24.5 (L) 26.0 - 34.0 pg   MCHC 31.0 (L) 32.0 - 36.0 g/dL   RDW 18.2 (H) 11.5 - 14.5 %   Platelets 243 150 - 440 K/uL   Neutrophils Relative % 52 %   Neutro Abs 3.5 1.4 - 6.5 K/uL   Lymphocytes Relative 35 %   Lymphs Abs 2.4 1.0 - 3.6 K/uL   Monocytes Relative 11 %   Monocytes Absolute 0.8 0.2 - 0.9 K/uL   Eosinophils Relative 1 %   Eosinophils Absolute 0.1 0 - 0.7 K/uL   Basophils Relative 1 %  Basophils Absolute 0.1 0 - 0.1 K/uL  Comprehensive metabolic panel     Status: Abnormal   Collection Time: 02/20/15  8:47 PM  Result Value Ref Range   Sodium 138 135 - 145 mmol/L   Potassium 4.0 3.5 - 5.1 mmol/L   Chloride 104 101 - 111 mmol/L   CO2 25 22 - 32 mmol/L   Glucose, Bld 193 (H) 65 - 99 mg/dL   BUN 42 (H) 6 - 20 mg/dL   Creatinine, Ser 2.09 (H) 0.44 - 1.00 mg/dL   Calcium 8.4 (L) 8.9 - 10.3 mg/dL   Total Protein 6.7 6.5 - 8.1 g/dL   Albumin 3.4 (L) 3.5 - 5.0 g/dL   AST 28 15 - 41 U/L   ALT 18 14 - 54 U/L   Alkaline Phosphatase 77 38 - 126 U/L   Total Bilirubin 1.1 0.3 - 1.2 mg/dL   GFR calc non Af Amer 22 (L) >60 mL/min   GFR calc Af Amer 26 (L) >60 mL/min    Comment: (NOTE) The eGFR has been calculated using the CKD EPI equation. This calculation has not been validated in all clinical situations. eGFR's persistently <60 mL/min signify possible Chronic Kidney Disease.    Anion gap 9 5 - 15  Lipase, blood     Status: None   Collection Time: 02/20/15  8:47 PM  Result Value Ref Range   Lipase 45 22 - 51 U/L  Troponin I     Status: None   Collection Time: 02/20/15  8:47 PM  Result Value Ref Range   Troponin I <0.03 <0.031 ng/mL    Comment:        NO INDICATION OF MYOCARDIAL INJURY.   Glucose, capillary     Status: Abnormal   Collection Time: 02/20/15 10:20 PM  Result Value Ref Range   Glucose-Capillary 238  (H) 65 - 99 mg/dL  Basic metabolic panel     Status: Abnormal   Collection Time: 02/21/15  4:31 AM  Result Value Ref Range   Sodium 143 135 - 145 mmol/L   Potassium 4.4 3.5 - 5.1 mmol/L   Chloride 106 101 - 111 mmol/L   CO2 27 22 - 32 mmol/L   Glucose, Bld 213 (H) 65 - 99 mg/dL   BUN 41 (H) 6 - 20 mg/dL   Creatinine, Ser 2.00 (H) 0.44 - 1.00 mg/dL   Calcium 8.1 (L) 8.9 - 10.3 mg/dL   GFR calc non Af Amer 23 (L) >60 mL/min   GFR calc Af Amer 27 (L) >60 mL/min    Comment: (NOTE) The eGFR has been calculated using the CKD EPI equation. This calculation has not been validated in all clinical situations. eGFR's persistently <60 mL/min signify possible Chronic Kidney Disease.    Anion gap 10 5 - 15  CBC     Status: Abnormal   Collection Time: 02/21/15  4:31 AM  Result Value Ref Range   WBC 8.3 3.6 - 11.0 K/uL   RBC 3.83 3.80 - 5.20 MIL/uL   Hemoglobin 9.3 (L) 12.0 - 16.0 g/dL   HCT 30.7 (L) 35.0 - 47.0 %   MCV 80.0 80.0 - 100.0 fL   MCH 24.3 (L) 26.0 - 34.0 pg   MCHC 30.4 (L) 32.0 - 36.0 g/dL   RDW 18.2 (H) 11.5 - 14.5 %   Platelets 231 150 - 440 K/uL  Urinalysis complete, with microscopic ([facility] only)     Status: Abnormal   Collection Time: 02/21/15  6:47 AM  Result Value Ref Range   Color, Urine YELLOW (A) YELLOW   APPearance CLEAR (A) CLEAR   Glucose, UA NEGATIVE NEGATIVE mg/dL   Bilirubin Urine NEGATIVE NEGATIVE   Ketones, ur NEGATIVE NEGATIVE mg/dL   Specific Gravity, Urine 1.015 1.005 - 1.030   Hgb urine dipstick NEGATIVE NEGATIVE   pH 5.0 5.0 - 8.0   Protein, ur NEGATIVE NEGATIVE mg/dL   Nitrite NEGATIVE NEGATIVE   Leukocytes, UA TRACE (A) NEGATIVE   RBC / HPF 0-5 0 - 5 RBC/hpf   WBC, UA 0-5 0 - 5 WBC/hpf   Bacteria, UA NONE SEEN NONE SEEN   Squamous Epithelial / LPF 0-5 (A) NONE SEEN   Mucous PRESENT    Hyaline Casts, UA PRESENT   Glucose, capillary     Status: Abnormal   Collection Time: 02/21/15  7:21 AM  Result Value Ref Range   Glucose-Capillary 214 (H) 65  - 99 mg/dL  Glucose, capillary     Status: Abnormal   Collection Time: 02/21/15 11:20 AM  Result Value Ref Range   Glucose-Capillary 150 (H) 65 - 99 mg/dL    Ct Abdomen Pelvis Wo Contrast  02/20/2015   CLINICAL DATA:  Severe abdominal pain, nausea and vomiting.  EXAM: CT ABDOMEN AND PELVIS WITHOUT CONTRAST  TECHNIQUE: Multidetector CT imaging of the abdomen and pelvis was performed following the standard protocol without IV contrast.  COMPARISON:  10/29/2012  FINDINGS: There is abnormal dilatation of proximal small bowel with abrupt transition to decompressed small bowel in the mid abdomen just to the left of midline. The transition point is probably about mid jejunum. The appearances are consistent with a low-moderate grade small bowel obstruction. No mass or focal inflammatory change is evident. There is no ascites. There is no extraluminal air. Colon is unremarkable, except for partial sigmoidectomy with unremarkable anastomosis. I believe there has been an appendectomy.  There is prior cholecystectomy. The liver and bile ducts appear normal on this unenhanced scan. There also are unremarkable unenhanced appearances of the pancreas, spleen, adrenals and kidneys. There is prior hysterectomy. No adnexal abnormalities are evident.  There is no significant abnormality in the lower chest. There is stable linear scarring in the left lung base.  There is no significant musculoskeletal lesion. There is prior posterior decompression with instrumented fusion from L4 through S1.  IMPRESSION: 1. Low-moderate grade small bowel obstruction, approximately mid jejunum, with transition point in the mid abdomen just to the left of midline. 2. No other acute findings are evident. 3. Prior hysterectomy, cholecystectomy, sigmoidectomy and appendectomy. Unremarkable colonic anastomosis in the low pelvic midline.   Electronically Signed   By: Andreas Newport M.D.   On: 02/20/2015 21:52   Dg Abd 2 Views  02/21/2015    CLINICAL DATA:  Intestinal obstruction.  Abdominal pain.  EXAM: ABDOMEN - 2 VIEW  COMPARISON:  CT abdomen and pelvis 02/20/2015  FINDINGS: An enteric tube has been placed with tip projecting over the proximal to mid gastric body. No intraperitoneal free air is identified. An air-fluid levels present in the stomach. There is mild dilatation of several small bowel loops in the left abdomen, similar in diameter to those in this region on the prior CT. There is a moderate amount of stool in the colon. Surgical clips are present in the right upper quadrant and in the pelvis. Prior lumbosacral spinal fusion is noted. Scarring is noted in the basilar left lower lobe.  IMPRESSION: 1. Mild small bowel dilatation in the left abdomen, similar  to recent CT and compatible with relatively low-grade obstruction as described on that study. 2. Enteric tube in the gastric body.   Electronically Signed   By: Logan Bores   On: 02/21/2015 08:10    Review of Systems  Constitutional: Negative.   HENT: Negative.   Eyes: Negative.   Respiratory: Negative.   Cardiovascular: Positive for leg swelling.  Gastrointestinal: Positive for heartburn, nausea, vomiting, abdominal pain and blood in stool.  Genitourinary: Negative.   Musculoskeletal: Positive for myalgias and joint pain.  Skin: Negative.   Neurological: Negative.   Endo/Heme/Allergies: Negative.   Psychiatric/Behavioral: Positive for depression.   Blood pressure 119/49, pulse 66, temperature 98.2 F (36.8 C), temperature source Oral, resp. rate 24, height _0  (1.626 m), weight 126.78 kg (279 lb 8 oz), SpO2 96 %. Physical Exam  Constitutional: She appears well-developed and well-nourished.  HENT:  Head: Normocephalic and atraumatic.  Right Ear: External ear normal.  Eyes: Conjunctivae and EOM are normal. Pupils are equal, round, and reactive to light.  Neck: Normal range of motion. Neck supple.  Cardiovascular: Normal rate, regular rhythm, normal heart sounds  and intact distal pulses.   Respiratory: Effort normal and breath sounds normal.  GI: She exhibits distension. There is tenderness. There is guarding.  Musculoskeletal: Normal range of motion.  Neurological: She is alert.  Skin: Skin is warm and dry.    Assessment/Plan:  atrial fibrillation  hypertension  hyperlipidemia  obesity  abdominal pain  diabetes  abdominal  Obstruction  GERD . PLAN  continue conservative surgical care  NG tube to intermittent suction as necessary  continue amiodarone for AFib control  not a good anticoagulation candidate because of bleeding  continue metoprolol therapy for rate control  agree with diabetes management with insulin  continue Protonix for reflux symptoms  DVT prophylaxis with subcu heparin Kejon Feild D. 02/21/2015, 11:41 AM

## 2015-02-21 NOTE — Consult Note (Signed)
Medical Consultation  Deborah Obrien [id number] DOB: [birth year] DOA: 02/20/2015 PCP: [name]., MD   Requesting physician: Dr. Kevan Rosebush Date of consultation: 02/21/2015 Reason for consultation: Medical management  Impression/Recommendations  1. Diabetes mellitus without complication: Patient will continue on sliding scale insulin for now. She uses insulin at home. Her blood sugars are elevated. I would suggest using insulin glargine 20 units at bedtime. We will continue to monitor her blood sugars. I will place a diabetes educator consultation.   2. Chronic kidney disease stage III: Patient is at her baseline creatinine. Patient follows up at [facility] nephrology. We will continue to monitor kidney function. Avoid nephrotoxic agents.  3.Parosmal  atrial fibrillation: Patient appears to be in normal sinus rhythm at this time. Patient is seen by Dr. Nehemiah Massed as an outpatient. Cardiology consultation has been placed. Her heart rate is well controlled on metoprolol and amiodarone which should be continued.  4. Essential hypertension: Patient's blood pressure is well controlled. We will continue metoprolol.  5. Small bowel obstruction: Management per surgery. Patient currently has IV fluids and NG tube.   Chief Complaint: Abdominal pain with nausea and vomiting   HPI:  This is a 47 4[age] female with a past medical history significant for chronic kidney disease stage III, diabetes and essential hypertension who presented to the emergency department with abdominal pain with nausea and vomiting. She was found to have a small bowel section. Hospital service was consulted for medical management. Patient continues to have ongoing pain with nausea and vomiting. Patient currently has an NG tube placed.  Review of Systems  Constitutional: Negative for fever, chills weight loss HENT: Negative for ear pain, nosebleeds, congestion, facial swelling, rhinorrhea, neck pain, neck stiffness and ear  discharge.   Respiratory: Negative for cough, shortness of breath, wheezing  Cardiovascular: Negative for chest pain, palpitations and leg swelling.  Gastrointestinal: Negative for heartburn, positive abdominal pain with nausea and vomiting Genitourinary: Negative for dysuria, urgency, frequency, hematuria Musculoskeletal: Negative for back pain or joint pain Neurological: Negative for dizziness, seizures, syncope, focal weakness,  numbness and headaches.  Hematological: Does not bruise/bleed easily.  Psychiatric/Behavioral: Negative for hallucinations, confusion, dysphoric mood   Past Medical History  Diagnosis Date  . Diabetes mellitus without complication   . Hypertension   . Morbid obesity   . Chronic kidney disease (CKD) stage G3a/A1, moderately decreased glomerular filtration rate (GFR) between 45-59 mL/min/1.73 square meter and albuminuria creatinine ratio less than 30 mg/g   . Paroxysmal atrial fibrillation    Past Surgical History  Procedure Laterality Date  . Shoulder arthroscopy with subacromial decompression Left   . Esophagogastroduodenoscopy N/A 01/27/2015    Procedure: ESOPHAGOGASTRODUODENOSCOPY (EGD);  Surgeon: Lollie Sails, MD;  Location: [facility] ENDOSCOPY;  Service: Endoscopy;  Laterality: N/A;  . Colon surgery     Social History:  reports that she has never smoked. She does not have any smokeless tobacco history on file. She reports that she does not drink alcohol. Her drug history is not on file.  Allergies  Allergen Reactions  . Cephalosporins Other (See Comments)    Other Reaction: Intolerance  . Hydrocodone-Acetaminophen Hives  . Macrolides And Ketolides Other (See Comments)    Other Reaction: Intolerance  . Meperidine Nausea Only and Other (See Comments)    SOB  . Other Nausea Only, Rash and Other (See Comments)    Uncoded Allergy. Allergen: NON-STEROIDS, Other Reaction: Not Assessed bextra - hands and feet swell Shellfish - SOB Shellfish - SOB  .  Sulfa Antibiotics     Other reaction(s): Other (See Comments) Other Reaction: Intolerance  . Aspirin Nausea Only and Other (See Comments)    stomach pain  . Celecoxib Other (See Comments)    GI bleed  . Cephalexin Diarrhea and Nausea Only  . Erythromycin Nausea And Vomiting    GI Upset  . Hydromorphone Other (See Comments)    hypotension,easy overdose hypotension,easy overdose  . Iodinated Diagnostic Agents Rash    CKD  . Oxycodone Other (See Comments)    easy overdose  . Ambrosia Artemisiifolia (Ragweed) Skin Test     Other reaction(s): Other (See Comments) Other Reaction: Not Assessed  . Atorvastatin Nausea Only and Other (See Comments)    weakness  . Codeine Diarrhea and Nausea And Vomiting  . Prednisone     Other reaction(s): Other (See Comments) Blood sugar to go up.  . Valdecoxib    History reviewed. No pertinent family history.  Prior to Admission medications   Medication Sig Start Date End Date Taking? Authorizing Provider  acetaminophen (TYLENOL) 500 MG tablet Take 1,000 mg by mouth every 6 (six) hours as needed for moderate pain.   Yes Historical Provider, MD  amiodarone (PACERONE) 200 MG tablet Take 200 mg by mouth daily. 08/25/14  Yes Historical Provider, MD  Calcium Carb-Cholecalciferol (CALCIUM 600 + D PO) Take 1 tablet by mouth 2 (two) times daily.   Yes Historical Provider, MD  Cranberry-Vitamin C-Vitamin E 4200-20-3 MG-MG-UNIT CAPS Take 2 tablets by mouth 2 (two) times daily.   Yes Historical Provider, MD  docusate sodium (COLACE) 100 MG capsule Take 200 mg by mouth at bedtime.   Yes Historical Provider, MD  furosemide (LASIX) 40 MG tablet Take 20 mg by mouth daily. 11/24/14 11/24/15 Yes Historical Provider, MD  hydrocortisone 2.5 % cream Apply 1 application topically at bedtime. Apply to rash under breast   Yes Historical Provider, MD  insulin aspart (NOVOLOG) 100 UNIT/ML injection Inject 15-35 Units into the skin 3 (three) times daily with meals. Blood glucose  0-150= 15 units 151-200= 17 units 201-250= 19 units 251-300= 23 units 301-350= 27 units 351-400= 31 units 401-up= 35 units   Yes Historical Provider, MD  insulin detemir (LEVEMIR) 100 UNIT/ML injection Inject 40 Units into the skin at bedtime.   Yes Historical Provider, MD  insulin glargine (LANTUS) 100 UNIT/ML injection Inject 38 Units into the skin at bedtime.    Yes Historical Provider, MD  ketoconazole (NIZORAL) 2 % cream Apply 1 application topically every 2 (two) hours. Apply to rash under breast   Yes Historical Provider, MD  metoprolol tartrate (LOPRESSOR) 25 MG tablet Take 0.5 tablets by mouth 2 (two) times daily. 06/11/13  Yes Historical Provider, MD  Multiple Vitamin (MULTI-VITAMINS) TABS Take 1 tablet by mouth daily.   Yes Historical Provider, MD  oxybutynin (DITROPAN-XL) 10 MG 24 hr tablet Take 10 mg by mouth at bedtime.   Yes Historical Provider, MD  pantoprazole (PROTONIX) 40 MG tablet Take 40 mg by mouth 2 (two) times daily. 12/23/14 12/23/15 Yes Historical Provider, MD  PARoxetine (PAXIL) 20 MG tablet Take 20 mg by mouth daily.   Yes Historical Provider, MD  potassium chloride (K-DUR) 10 MEQ tablet Take 1 tablet by mouth every morning. 11/24/14 11/24/15 Yes Historical Provider, MD  pravastatin (PRAVACHOL) 20 MG tablet Take 1 tablet by mouth every evening. 11/20/13  Yes Historical Provider, MD  sucralfate (CARAFATE) 1 G tablet Take 1 tablet by mouth 4 (four) times daily -  before meals  and at bedtime. 12/23/14 12/23/15 Yes Historical Provider, MD  vitamin D, CHOLECALCIFEROL, 400 UNITS tablet Take 1-2 tablets by mouth 2 (two) times daily. 2 tabs in the morning and 1 at night 03/25/14  Yes Historical Provider, MD    Physical Exam: Blood pressure 119/49, pulse 66, temperature 98.2 F (36.8 C), temperature source Oral, resp. rate 24, height 5\' 4"  (1.626 m), weight 126.78 kg (279 lb 8 oz), SpO2 96 %. @VITALS2 @ Filed Weights   02/20/15 1958 02/21/15 0045  Weight: 125.193 kg (276 lb)  126.78 kg (279 lb 8 oz)    Intake/Output Summary (Last 24 hours) at 02/21/15 1059 Last data filed at 02/21/15 0735  Gross per 24 hour  Intake    573 ml  Output    650 ml  Net    -77 ml     Constitutional: Appears well-developed and well-nourished. Patient has NG tube placed HENT: Normocephalic. Atraumatic.  Eyes: Conjunctivae and EOM are normal. PERRLA, no scleral icterus.  Neck: Normal ROM. Neck supple. No JVD. No tracheal deviation. No thyromegaly.  CVS: RRR, S1/S2 +, no murmurs, no gallops, no carotid bruit.  Pulmonary: Effort and breath sounds normal, no stridor, rhonchi, wheezes, rales.  Abdominal: Hypoactive bowel sounds. Patient has no guarding. No rebound. Patient has diffuse tenderness.  Musculoskeletal: Normal range of motion. No edema and no tenderness.  Neuro: Alert. Normal reflexes, muscle tone coordination. No cranial nerve deficit. Skin: Skin is warm and dry. No rash noted. Not diaphoretic. No erythema. No pallor.  Psychiatric: Normal mood and affect. Behavior, judgment, thought content normal.    Labs  Basic Metabolic Panel:  Recent Labs Lab 02/21/15 0431  NA 143  K 4.4  CL 106  CO2 27  GLUCOSE 213*  BUN 41*  CREATININE 2.00*  CALCIUM 8.1*   Liver Function Tests:  Recent Labs Lab 02/20/15 2047  AST 28  ALT 18  ALKPHOS 77  BILITOT 1.1  PROT 6.7  ALBUMIN 3.4*    Recent Labs Lab 02/20/15 2047  LIPASE 45    CBC:  Recent Labs Lab 02/20/15 2047 02/21/15 0431  WBC 6.7 8.3  NEUTROABS 3.5  --   HGB 10.2* 9.3*  HCT 32.9* 30.7*  MCV 79.1* 80.0  PLT 243 231   Cardiac Enzymes:  Recent Labs Lab 02/20/15 2047  TROPONINI <0.03   BNP: Invalid input(s): POCBNP CBG:  Recent Labs Lab 02/21/15 0721  GLUCAP 214*    Radiological Exams: Abdominal x-ray shows mild small bowel dilation in the left abdomen  CT scan shows low moderate grade small bowel obstruction, approximately mid jejunum, with transition point in the mid abdomen just  left of midline     Thank you for allowing me to participate in the care of your patient. We will continue to follow.   Time spent: 50 minutes  Bettey Costa MD

## 2015-02-21 NOTE — Progress Notes (Signed)
   02/21/15 1000  Clinical Encounter Type  Visited With Patient  Visit Type Spiritual support  Referral From Patient  Consult/Referral To Chaplain  Spiritual Encounters  Spiritual Needs Prayer  Stress Factors  Patient Stress Factors None identified  Chaplin prayed with patient as requested.  Argentina Donovan Marvell Stavola-pager: [phone]

## 2015-02-22 ENCOUNTER — Inpatient Hospital Stay: Payer: Medicare Other

## 2015-02-22 DIAGNOSIS — R0600 Dyspnea, unspecified: Secondary | ICD-10-CM

## 2015-02-22 LAB — BASIC METABOLIC PANEL
Anion gap: 9 (ref 5–15)
BUN: 40 mg/dL — ABNORMAL HIGH (ref 6–20)
CALCIUM: 7.9 mg/dL — AB (ref 8.9–10.3)
CO2: 26 mmol/L (ref 22–32)
CREATININE: 2.04 mg/dL — AB (ref 0.44–1.00)
Chloride: 109 mmol/L (ref 101–111)
GFR calc Af Amer: 26 mL/min — ABNORMAL LOW (ref >60)
GFR, EST NON AFRICAN AMERICAN: 23 mL/min — AB (ref >60)
Glucose, Bld: 312 mg/dL — ABNORMAL HIGH (ref 65–99)
Potassium: 5.2 mmol/L — ABNORMAL HIGH (ref 3.5–5.1)
Sodium: 144 mmol/L (ref 135–145)

## 2015-02-22 LAB — GLUCOSE, CAPILLARY
GLUCOSE-CAPILLARY: 310 mg/dL — AB (ref 65–99)
GLUCOSE-CAPILLARY: 228 mg/dL — AB (ref 65–99)
GLUCOSE-CAPILLARY: 92 mg/dL (ref 65–99)
GLUCOSE-CAPILLARY: 163 mg/dL — AB (ref 65–99)
Glucose-Capillary: 33 mg/dL — CL (ref 65–99)
Glucose-Capillary: 31 mg/dL — CL (ref 65–99)

## 2015-02-22 LAB — TROPONIN I
Troponin I: 0.08 ng/mL — ABNORMAL HIGH (ref <0.031)
Troponin I: 0.12 ng/mL — ABNORMAL HIGH (ref <0.031)
Troponin I: 0.16 ng/mL — ABNORMAL HIGH (ref <0.031)

## 2015-02-22 LAB — BRAIN NATRIURETIC PEPTIDE: B NATRIURETIC PEPTIDE 5: 740 pg/mL — AB (ref 0.0–100.0)

## 2015-02-22 MED ORDER — DEXTROSE 50 % IV SOLN
25.0000 mL | Freq: Once | INTRAVENOUS | Status: AC
  Administered 2015-02-22 (×2): 25 mL via INTRAVENOUS
  Filled 2015-02-22: qty 50

## 2015-02-22 MED ORDER — DEXTROSE 50 % IV SOLN
INTRAVENOUS | Status: AC
  Administered 2015-02-22: 25 mL via INTRAVENOUS
  Filled 2015-02-22: qty 50

## 2015-02-22 MED ORDER — INSULIN REGULAR HUMAN 100 UNIT/ML IJ SOLN
5.0000 [IU] | Freq: Once | INTRAMUSCULAR | Status: DC
  Filled 2015-02-22: qty 0.05

## 2015-02-22 MED ORDER — METOPROLOL TARTRATE 25 MG PO TABS
25.0000 mg | ORAL_TABLET | Freq: Two times a day (BID) | ORAL | Status: DC
  Administered 2015-02-22 – 2015-02-25 (×3): 25 mg via ORAL
  Filled 2015-02-22 (×4): qty 1

## 2015-02-22 MED ORDER — LORAZEPAM 2 MG/ML IJ SOLN
1.0000 mg | Freq: Once | INTRAMUSCULAR | Status: AC
  Administered 2015-02-22: 1 mg via INTRAVENOUS

## 2015-02-22 MED ORDER — NITROGLYCERIN 0.4 MG SL SUBL
SUBLINGUAL_TABLET | SUBLINGUAL | Status: AC
  Administered 2015-02-22: 09:00:00
  Filled 2015-02-22: qty 2

## 2015-02-22 MED ORDER — LORAZEPAM 2 MG/ML IJ SOLN
INTRAMUSCULAR | Status: AC
  Administered 2015-02-22: 1 mg
  Filled 2015-02-22: qty 1

## 2015-02-22 MED ORDER — NITROGLYCERIN 2 % TD OINT: 0.5000 [in_us] | TOPICAL_OINTMENT | Freq: Four times a day (QID) | TRANSDERMAL | Status: DC

## 2015-02-22 MED ORDER — INSULIN GLARGINE 100 UNIT/ML ~~LOC~~ SOLN
22.0000 [IU] | Freq: Every day | SUBCUTANEOUS | Status: DC
  Administered 2015-02-23 – 2015-02-25 (×3): 22 [IU] via SUBCUTANEOUS
  Filled 2015-02-22 (×6): qty 0.22

## 2015-02-22 MED ORDER — LORAZEPAM 2 MG/ML IJ SOLN
2.0000 mg | INTRAMUSCULAR | Status: DC | PRN
  Administered 2015-02-22 – 2015-02-23 (×2): 2 mg via INTRAVENOUS
  Filled 2015-02-22 (×2): qty 1

## 2015-02-22 MED ORDER — INSULIN REGULAR HUMAN 100 UNIT/ML IJ SOLN
10.0000 [IU] | Freq: Once | INTRAMUSCULAR | Status: DC
  Filled 2015-02-22: qty 0.1

## 2015-02-22 MED ORDER — SODIUM CHLORIDE 0.9 % IJ SOLN
INTRAMUSCULAR | Status: AC
  Administered 2015-02-22: 09:00:00
  Filled 2015-02-22: qty 10

## 2015-02-22 NOTE — Progress Notes (Signed)
Nurse called rapid response team, and I was present throughout the initial minutes of their response, and noted a oxygen saturation greater than 90% with normal pulse and blood pressure. Patient appears extremely anxious to me. Dr. Benjie Karvonen from internal medicine is now present and has taken over.

## 2015-02-22 NOTE — Progress Notes (Signed)
Notified Dr Rexene Edison unable to obtain iv order to use feet if needed

## 2015-02-22 NOTE — Progress Notes (Signed)
Rapid Response called at [phone] for this pt. Pt complaining of chest pain and difficulty breathing. Pt states she has had anxiety attacks in the past. EKG was done and per Dr. Benjie Karvonen nothing remarkable was noted. Pt was given nitro x 1. Morphine 2mg  x 1. And Ativan 1mg  x 1. Pt stated relief after Ativan was given. Chest x-ray and off unit telemetry was ordered. [name] E 10:13 AM. 02/22/2015

## 2015-02-22 NOTE — Progress Notes (Signed)
Pt c/o chest pain and difficulty breathing.  Pt stated that chest pain was centralized to mid-sternum area.  Pt denied radiation to arms or back.  Pt denied jaw pain.  Pt was not diaphoretic.  Pt was hypoglycemic with a BG reading of 33.  An amp of D50 was given per nurse driven hypoglycemic protocol and BG increased to 92 (See Results Review).  Dr. Benjie Karvonen was contacted for continued reference of care. VSS taken (See VS Flowsheet).  Pt contiued to stay on O2 w/oxygen saturation at 100% (See VS Flowsheet). A 12-Lead EKG ordered and Dr. Doy Hutching was on site to read.  Morphine IV was administered for pain and pt was given Ativan as well (See MAR).  Cardiac enzyme Troponin showed an elevation from previous reading.  Dr. Doy Hutching informed.  After initiation of the above noted, pt was able to relax and rest stating that chest pain had subsided and she felt as though it was easier to breath.    The pt was resting in stable condition w/husband at bedside and bed in low and locked position w/ call bell and telephone within reach.    Will continue to monitor and notify MD of any additional changes.

## 2015-02-22 NOTE — Progress Notes (Signed)
Subjective:   The patient is sitting up on the side of the bed with (possibly symptomatic) dyspnea. She is complaining of substernal chest pain. She also says that she is extremely thirsty. She denies passing any flatus and her nasogastric output is still fairly voluminous and yellow in character. She denies any abdominal pain. Specifically, the right lower quadrant pain that she had on presentation has resolved.  Vital signs in last 24 hours: Temp:  [97.5 F (36.4 C)-98.6 F (37 C)] 98.5 F (36.9 C) (06/12 0755) Pulse Rate:  [60-69] 65 (06/12 0755) Resp:  [19-20] 19 (06/12 0440) BP: (95-148)/(38-129) 97/44 mmHg (06/12 0755) SpO2:  [75 %-100 %] 95 % (06/12 0755) Last BM Date: 02/20/15  Intake/Output from previous day: 06/11 0701 - 06/12 0700 In: 2161.5 [I.V.:1981.5; NG/GT:180] Out: 2550 [Urine:1300; Emesis/NG output:1250]  Exam:  Lungs clear to auscultation with breath sounds equal bilaterally. Abdomen is obese, nontender, nondistended, without tympany.  Lab Results:  CBC  Recent Labs  02/20/15 2047 02/21/15 0431  WBC 6.7 8.3  HGB 10.2* 9.3*  HCT 32.9* 30.7*  PLT 243 231   CMP     Component Value Date/Time   NA 143 02/21/2015 0431   NA 141 10/16/2014 1135   K 4.4 02/21/2015 0431   K 4.6 10/16/2014 1135   CL 106 02/21/2015 0431   CL 108* 10/16/2014 1135   CO2 27 02/21/2015 0431   CO2 27 10/16/2014 1135   GLUCOSE 213* 02/21/2015 0431   GLUCOSE 97 10/16/2014 1135   BUN 41* 02/21/2015 0431   BUN 25* 10/16/2014 1135   CREATININE 2.00* 02/21/2015 0431   CREATININE 1.35* 10/16/2014 1135   CALCIUM 8.1* 02/21/2015 0431   CALCIUM 8.5 10/16/2014 1135   PROT 6.7 02/20/2015 2047   PROT 8.0 10/29/2012 1100   ALBUMIN 3.4* 02/20/2015 2047   ALBUMIN 3.9 10/29/2012 1100   AST 28 02/20/2015 2047   AST 47* 10/29/2012 1100   ALT 18 02/20/2015 2047   ALT 27 10/29/2012 1100   ALKPHOS 77 02/20/2015 2047   ALKPHOS 156* 10/29/2012 1100   BILITOT 1.1 02/20/2015 2047   GFRNONAA  23* 02/21/2015 0431   GFRNONAA 35* 10/29/2012 1100   GFRAA 27* 02/21/2015 0431   GFRAA 41* 10/29/2012 1100   PT/INR No results for input(s): LABPROT, INR in the last 72 hours.  Studies/Results: Ct Abdomen Pelvis Wo Contrast  02/20/2015   CLINICAL DATA:  Severe abdominal pain, nausea and vomiting.  EXAM: CT ABDOMEN AND PELVIS WITHOUT CONTRAST  TECHNIQUE: Multidetector CT imaging of the abdomen and pelvis was performed following the standard protocol without IV contrast.  COMPARISON:  10/29/2012  FINDINGS: There is abnormal dilatation of proximal small bowel with abrupt transition to decompressed small bowel in the mid abdomen just to the left of midline. The transition point is probably about mid jejunum. The appearances are consistent with a low-moderate grade small bowel obstruction. No mass or focal inflammatory change is evident. There is no ascites. There is no extraluminal air. Colon is unremarkable, except for partial sigmoidectomy with unremarkable anastomosis. I believe there has been an appendectomy.  There is prior cholecystectomy. The liver and bile ducts appear normal on this unenhanced scan. There also are unremarkable unenhanced appearances of the pancreas, spleen, adrenals and kidneys. There is prior hysterectomy. No adnexal abnormalities are evident.  There is no significant abnormality in the lower chest. There is stable linear scarring in the left lung base.  There is no significant musculoskeletal lesion. There is prior posterior  decompression with instrumented fusion from L4 through S1.  IMPRESSION: 1. Low-moderate grade small bowel obstruction, approximately mid jejunum, with transition point in the mid abdomen just to the left of midline. 2. No other acute findings are evident. 3. Prior hysterectomy, cholecystectomy, sigmoidectomy and appendectomy. Unremarkable colonic anastomosis in the low pelvic midline.   Electronically Signed   By: Andreas Newport M.D.   On: 02/20/2015 21:52    Dg Abd 2 Views  02/21/2015   CLINICAL DATA:  Intestinal obstruction.  Abdominal pain.  EXAM: ABDOMEN - 2 VIEW  COMPARISON:  CT abdomen and pelvis 02/20/2015  FINDINGS: An enteric tube has been placed with tip projecting over the proximal to mid gastric body. No intraperitoneal free air is identified. An air-fluid levels present in the stomach. There is mild dilatation of several small bowel loops in the left abdomen, similar in diameter to those in this region on the prior CT. There is a moderate amount of stool in the colon. Surgical clips are present in the right upper quadrant and in the pelvis. Prior lumbosacral spinal fusion is noted. Scarring is noted in the basilar left lower lobe.  IMPRESSION: 1. Mild small bowel dilatation in the left abdomen, similar to recent CT and compatible with relatively low-grade obstruction as described on that study. 2. Enteric tube in the gastric body.   Electronically Signed   By: Logan Bores   On: 02/21/2015 08:10    Assessment/Plan: Continued partial small bowel obstruction with no evidence of threatened intestine (e.g. no pain, no tenderness, no fever, no leukocytosis) in a morbidly obese patient with multiple medical problems who is currently experiencing dyspnea. I have contacted the internal medicine consultant regarding her medical symptoms and they intend to see her early this morning. Continue nasogastric suction.

## 2015-02-22 NOTE — Progress Notes (Signed)
Patient complained of shortness of breath and chest pain. Patient blood pressure 112/70, pulse 66, respiration labored at 30, oxygen saturation 76 % on room air patient placed on oxygen and rapid response called. Patient anxious and continue to complain of shortness of breath and oxygen saturation 90 % on 6 liters. Rapid response team at the bedside, EKG done, Dr. Leanora Cover at the bedside and called DR. Mody. Ativan 1 mg, nitro given as ordered., MSO4 2 mg given as ordered. Patient calmed down after meds given. Husband called and updated on patient status.

## 2015-02-22 NOTE — Progress Notes (Signed)
Subjective:   multiple episodes of chest pain shortness of breath weakness and fatigue palpitations tachycardia. Still having trouble with abdominal pain nausea vomiting.  Objective:  Vital Signs in the last 24 hours: Temp:  [98.2 F (36.8 C)-99.3 F (37.4 C)] 98.5 F (36.9 C) (06/12 1639) Pulse Rate:  [60-67] 66 (06/12 1659) Resp:  [17-30] 17 (06/12 1540) BP: (90-152)/(23-119) 150/58 mmHg (06/12 1659) SpO2:  [76 %-100 %] 100 % (06/12 1639)  Intake/Output from previous day: 06/11 0701 - 06/12 0700 In: 2161.5 [I.V.:1981.5; NG/GT:180] Out: 2550 [Urine:1300; Emesis/NG output:1250] Intake/Output from this shift: Total I/O In: 607.5 [I.V.:547.5; NG/GT:60] Out: 600 [Urine:300; Emesis/NG output:300]  Physical Exam: General appearance: alert and cooperative Neck: no adenopathy, no carotid bruit, no JVD, supple, symmetrical, trachea midline and thyroid not enlarged, symmetric, no tenderness/mass/nodules Lungs: clear to auscultation bilaterally Heart: irregularly irregular rhythm and S1, S2 normal Abdomen: abnormal findings:  distended, obese and rebound tenderness Extremities: extremities normal, atraumatic, no cyanosis or edema Pulses: 2+ and symmetric Skin: Skin color, texture, turgor normal. No rashes or lesions Neurologic: Alert and oriented X 3, normal strength and tone. Normal symmetric reflexes. Normal coordination and gait  Lab Results:  Recent Labs  02/20/15 2047 02/21/15 0431  WBC 6.7 8.3  HGB 10.2* 9.3*  PLT 243 231    Recent Labs  02/21/15 0431 02/22/15 0853  NA 143 144  K 4.4 5.2*  CL 106 109  CO2 27 26  GLUCOSE 213* 312*  BUN 41* 40*  CREATININE 2.00* 2.04*    Recent Labs  02/22/15 0853 02/22/15 1621  TROPONINI 0.08* 0.12*   Hepatic Function Panel  Recent Labs  02/20/15 2047  PROT 6.7  ALBUMIN 3.4*  AST 28  ALT 18  ALKPHOS 77  BILITOT 1.1   No results for input(s): CHOL in the last 72 hours. No results for input(s): PROTIME in the last  72 hours.  Imaging: Imaging results have been reviewed  Cardiac Studies:  Assessment/Plan:  Angina Arrhythmia Atrial Fibrillation Chest Pain Edema Shortness of Breath Obesity   abdominal pain  nausea  anxiety . PLAN  continue intermittent NG suction  DVT prophylaxis  Rate control for atrial fibrillation  continue amiodarone for atrial fibrillation  insulin therapy for diabetes  Protonix for GERD symptoms  continue Paxil for depression symptoms  metoprolol therapy for rate control  Ativan for anxiety symptoms  conservative medical therapy for recent chest pain shortness of breath     LOS: 2 days    Deborah Obrien D. 02/22/2015, 5:35 PM

## 2015-02-22 NOTE — Progress Notes (Signed)
[place] [facility] [place] NAME: Deborah Obrien    MR#:  [id number]  DATE OF BIRTH:  04-Oct-1940  SUBJECTIVE:  Patient was seen earlier this morning. Rapid response was called. Patient was complaining of chest pain as well as respiratory issues. Her vitals were stable. Her EKG showed no acute changes. She was given 2 mg of morphine, 1 mg of Ativan and nitroglycerin.  REVIEW OF SYSTEMS:    Review of Systems  Constitutional: Positive for malaise/fatigue.  Respiratory: Positive for shortness of breath. Negative for cough and sputum production.   Cardiovascular: Positive for chest pain. Negative for palpitations, orthopnea, claudication and leg swelling.  Gastrointestinal: Positive for nausea and abdominal pain. Negative for vomiting and blood in stool.  Neurological: Positive for headaches. Negative for dizziness, tingling and tremors.  Psychiatric/Behavioral: The patient is nervous/anxious.     Tolerating Diet: Nothing by mouth      DRUG ALLERGIES:   Allergies  Allergen Reactions  . Cephalosporins Other (See Comments)    Other Reaction: Intolerance  . Hydrocodone-Acetaminophen Hives  . Macrolides And Ketolides Other (See Comments)    Other Reaction: Intolerance  . Meperidine Nausea Only and Other (See Comments)    SOB  . Other Nausea Only, Rash and Other (See Comments)    Uncoded Allergy. Allergen: NON-STEROIDS, Other Reaction: Not Assessed bextra - hands and feet swell Shellfish - SOB Shellfish - SOB  . Sulfa Antibiotics     Other reaction(s): Other (See Comments) Other Reaction: Intolerance  . Aspirin Nausea Only and Other (See Comments)    stomach pain  . Celecoxib Other (See Comments)    GI bleed  . Cephalexin Diarrhea and Nausea Only  . Erythromycin Nausea And Vomiting    GI Upset  . Hydromorphone Other (See Comments)    hypotension,easy overdose hypotension,easy overdose  . Iodinated Diagnostic Agents Rash   CKD  . Oxycodone Other (See Comments)    easy overdose  . Ambrosia Artemisiifolia (Ragweed) Skin Test     Other reaction(s): Other (See Comments) Other Reaction: Not Assessed  . Atorvastatin Nausea Only and Other (See Comments)    weakness  . Codeine Diarrhea and Nausea And Vomiting  . Prednisone     Other reaction(s): Other (See Comments) Blood sugar to go up.  . Valdecoxib     VITALS:  Blood pressure 152/119, pulse 66, temperature 99.3 F (37.4 C), temperature source Oral, resp. rate 19, height 5\' 4"  (1.626 m), weight 126.78 kg (279 lb 8 oz), SpO2 97 %.  PHYSICAL EXAMINATION:   Physical Exam    LABORATORY PANEL:   CBC  Recent Labs Lab 02/21/15 0431  WBC 8.3  HGB 9.3*  HCT 30.7*  PLT 231   ------------------------------------------------------------------------------------------------------------------  Chemistries   Recent Labs Lab 02/20/15 2047 02/21/15 0431  NA 138 143  K 4.0 4.4  CL 104 106  CO2 25 27  GLUCOSE 193* 213*  BUN 42* 41*  CREATININE 2.09* 2.00*  CALCIUM 8.4* 8.1*  AST 28  --   ALT 18  --   ALKPHOS 77  --   BILITOT 1.1  --    ------------------------------------------------------------------------------------------------------------------  Cardiac Enzymes  Recent Labs Lab 02/20/15 2047 02/22/15 0853  TROPONINI <0.03 0.08*   ------------------------------------------------------------------------------------------------------------------  RADIOLOGY:  Ct Abdomen Pelvis Wo Contrast  02/20/2015    IMPRESSION: 1. Low-moderate grade small bowel obstruction, approximately mid jejunum, with transition point in the mid abdomen just to the left of midline. 2. No other  acute findings are evident. 3. Prior hysterectomy, cholecystectomy, sigmoidectomy and appendectomy. Unremarkable colonic anastomosis in the low pelvic midline.   Electronically Signed   By: Andreas Newport M.D.   On: 02/20/2015 21:52   Dg Chest 1 View  02/22/2015    IMPRESSION: No active disease.   Electronically Signed   By: Inez Catalina M.D.   On: 02/22/2015 09:15   Dg Abd 2 Views  02/21/2015   IMPRESSION: 1. Mild small bowel dilatation in the left abdomen, similar to recent CT and compatible with relatively low-grade obstruction as described on that study. 2. Enteric tube in the gastric body.   Electronically Signed   By: Logan Bores   On: 02/21/2015 08:10   EKG: Normal sinus rhythm. No ST elevation or depression.  ASSESSMENT AND PLAN:   This is a 74 year old female with a history of atrial fibrillation, chronic kidney disease stage III and diabetes who was admitted to the surgical service due to SBO. Rapid response was called early this morning for shortness of breath and chest pain.   1. Shortness of breath and chest pain: . Her troponin is slightly elevated, it is noted that she does also have chronic kidney disease. The morphine and Ativan didn't seem to help with her pain and shortness of breath. Her chest x-ray this morning shows no acute infiltrate. I will continue to cycle cardiac enzymes. Patient is now on telemetry monitoring. Cardiology is also following patient.  2. Chronic kidney disease stage III: Her creatinine seems to be stable.  3. Paroxysmal atrial fibrillation: Cardiology is following. Patient will continue on amiodarone and metoprolol. Anticoagulation was discontinued due to GI bleeding.  4. Diabetes: Patient's blood sugars are still high. I will increase her Lantus. Continue sliding scale insulin. ADA diet when patient is able to take in by mouth has.    5. SBO: Per surgery. I spoke with surgeon this morning and no plans for surgery today. Continue NG tube.  6. Hyperkalemia: Patient's serum level is 5.2. I will treat with insulin and D50.  Management plans discussed with the patient and she is in agreement.  CODE STATUS: FULL  CRITICAL CARE TOTAL TIME TAKING CARE OF THIS PATIENT: 45 minutes.   POSSIBLE D/C IN 3-4 DAYS,  DEPENDING ON CLINICAL CONDITION.   Kimberleigh Mehan M.D on 02/22/2015 at 10:25 AM  Between 7am to 6pm - Pager - [phone] After 6pm go to [email or web address] - password [email or web address] [facility]  Office  [phone]  CC: Primary care physician; [name]., MD

## 2015-02-23 ENCOUNTER — Inpatient Hospital Stay: Payer: Medicare Other

## 2015-02-23 DIAGNOSIS — E119 Type 2 diabetes mellitus without complications: Secondary | ICD-10-CM

## 2015-02-23 DIAGNOSIS — K565 Intestinal adhesions [bands] with obstruction (postprocedural) (postinfection): Secondary | ICD-10-CM

## 2015-02-23 LAB — GLUCOSE, CAPILLARY
GLUCOSE-CAPILLARY: 183 mg/dL — AB (ref 65–99)
GLUCOSE-CAPILLARY: 106 mg/dL — AB (ref 65–99)
Glucose-Capillary: 221 mg/dL — ABNORMAL HIGH (ref 65–99)
Glucose-Capillary: 174 mg/dL — ABNORMAL HIGH (ref 65–99)

## 2015-02-23 LAB — CBC
HCT: 30.4 % — ABNORMAL LOW (ref 35.0–47.0)
Hemoglobin: 9.1 g/dL — ABNORMAL LOW (ref 12.0–16.0)
MCH: 25.1 pg — AB (ref 26.0–34.0)
MCHC: 30 g/dL — ABNORMAL LOW (ref 32.0–36.0)
MCV: 83.6 fL (ref 80.0–100.0)
PLATELETS: 167 10*3/uL (ref 150–440)
RBC: 3.64 MIL/uL — ABNORMAL LOW (ref 3.80–5.20)
RDW: 19 % — AB (ref 11.5–14.5)
WBC: 8.9 10*3/uL (ref 3.6–11.0)

## 2015-02-23 LAB — BASIC METABOLIC PANEL
ANION GAP: 6 (ref 5–15)
BUN: 34 mg/dL — ABNORMAL HIGH (ref 6–20)
CALCIUM: 8.2 mg/dL — AB (ref 8.9–10.3)
CO2: 25 mmol/L (ref 22–32)
Chloride: 114 mmol/L — ABNORMAL HIGH (ref 101–111)
Creatinine, Ser: 1.84 mg/dL — ABNORMAL HIGH (ref 0.44–1.00)
GFR calc Af Amer: 30 mL/min — ABNORMAL LOW (ref >60)
GFR calc non Af Amer: 26 mL/min — ABNORMAL LOW (ref >60)
GLUCOSE: 167 mg/dL — AB (ref 65–99)
Potassium: 5.3 mmol/L — ABNORMAL HIGH (ref 3.5–5.1)
Sodium: 145 mmol/L (ref 135–145)

## 2015-02-23 MED ORDER — DEXTROSE 50 % IV SOLN
25.0000 mL | Freq: Once | INTRAVENOUS | Status: AC
  Administered 2015-02-23: 25 mL via INTRAVENOUS
  Filled 2015-02-23: qty 50

## 2015-02-23 MED ORDER — FLEET ENEMA 7-19 GM/118ML RE ENEM
1.0000 | ENEMA | Freq: Once | RECTAL | Status: AC
  Administered 2015-02-23: 1 via RECTAL

## 2015-02-23 MED ORDER — MAGNESIUM HYDROXIDE 400 MG/5ML PO SUSP: 30.0000 mL | Freq: Every day | ORAL | Status: DC | PRN

## 2015-02-23 MED ORDER — INSULIN REGULAR HUMAN 100 UNIT/ML IJ SOLN
10.0000 [IU] | Freq: Once | INTRAMUSCULAR | Status: AC
  Administered 2015-02-23: 10 [IU] via INTRAVENOUS
  Filled 2015-02-23: qty 0.1

## 2015-02-23 MED ORDER — INSULIN REGULAR HUMAN 100 UNIT/ML IJ SOLN
10.0000 [IU] | Freq: Once | INTRAMUSCULAR | Status: DC
  Filled 2015-02-23: qty 0.1

## 2015-02-23 NOTE — Progress Notes (Signed)
CC: SBO Subjective: Patient describes minimal if any abdominal pain. She has had no nausea or vomiting with a nasogastric tube in place. She has not passed any gas nor has she had a bowel movement. No fevers or chills.  Objective: Vital signs in last 24 hours: Temp:  [98.3 F (36.8 C)-99.2 F (37.3 C)] 98.4 F (36.9 C) (06/13 0742) Pulse Rate:  [60-87] 64 (06/13 0742) Resp:  [17-20] 20 (06/13 0742) BP: (90-150)/(23-60) 105/42 mmHg (06/13 0806) SpO2:  [97 %-100 %] 100 % (06/13 0742) Last BM Date: 02/20/15  Intake/Output from previous day: 06/12 0701 - 06/13 0700 In: 1820 [I.V.:1740; NG/GT:80] Out: 1100 [Urine:500; Emesis/NG output:600] Intake/Output this shift: Total I/O In: 307.5 [I.V.:307.5] Out: 0   Physical exam:  Morbidly obese comfortable-appearing female patient.  Nasogastric tube is in place.  Abdomen is soft and minimally distended nontender, multiple scars noted.  Calves are nontender minimal edema.  Lab Results: CBC   Recent Labs  02/21/15 0431 02/23/15 0422  WBC 8.3 8.9  HGB 9.3* 9.1*  HCT 30.7* 30.4*  PLT 231 167   BMET  Recent Labs  02/22/15 0853 02/23/15 0422  NA 144 145  K 5.2* 5.3*  CL 109 114*  CO2 26 25  GLUCOSE 312* 167*  BUN 40* 34*  CREATININE 2.04* 1.84*  CALCIUM 7.9* 8.2*   PT/INR No results for input(s): LABPROT, INR in the last 72 hours. ABG No results for input(s): PHART, HCO3 in the last 72 hours.  Invalid input(s): PCO2, PO2  Studies/Results: Dg Chest 1 View  02/22/2015   CLINICAL DATA:  Shortness of Breath  EXAM: CHEST  1 VIEW  COMPARISON:  11/22/2014  FINDINGS: Cardiac shadow remains mildly enlarged. The lungs are well aerated bilaterally. No focal infiltrate or sizable effusion is seen. The bony structures are within normal limits.  IMPRESSION: No active disease.   Electronically Signed   By: Inez Catalina M.D.   On: 02/22/2015 09:15    Anti-infectives: Anti-infectives    None      Assessment/Plan:  This  patient with a bowel obstruction she has not passed any gas nor has she had a bowel movement she has been on bowel rest with nasogastric suction for several days. No x-rays were ordered for this morning. I will repeat films this morning and reevaluate depending on the comparison studies. Discuss potential surgical options with patient should she not progress.  Patient currently describes no chest pain and believes that she may have had an anxiety attack.  Florene Glen, MD, FACS  02/23/2015

## 2015-02-23 NOTE — Care Management Note (Signed)
Case Management Note  Patient Details  Name: Deborah Obrien MRN: [id number] Date of Birth: 05-15-1941  Subjective/Objective:                    Action/Plan:   Expected Discharge Date:                  Expected Discharge Plan:     In-House Referral:     Discharge planning Services     Post Acute Care Choice:    Choice offered to:     DME Arranged:    DME Agency:     HH Arranged:    Mark Agency:     Status of Service:     Medicare Important Message Given:   yes Date Medicare IM Given:   02/23/15 Medicare IM give by:   MR case manager Date Additional Medicare IM Given:    Additional Medicare Important Message give by:     If discussed at [place] of Stay Meetings, dates discussed:    Additional Comments:  Jnaya Butrick A, RN 02/23/2015, 8:47 AM

## 2015-02-23 NOTE — Progress Notes (Signed)
Personal review of abdominal film shows improvement.  Patient was sleeping but I discussed with her husband the plans for milk of magnesia via NG tube as well as fleets enema. She has considerable stool in her right colon and there is gas in the sigmoid colon. Hopefully we can avoid surgery in this patient is showing some improvement.

## 2015-02-23 NOTE — Progress Notes (Addendum)
Inpatient Diabetes Program Recommendations  AACE/ADA: New Consensus Statement on Inpatient Glycemic Control (2013)  Target Ranges:  Prepandial:   less than 140 mg/dL      Peak postprandial:   less than 180 mg/dL (1-2 hours)      Critically ill patients:  140 - 180 mg/dL   Review of Glycemic Control:  Results for Deborah Obrien, Deborah Obrien (MRN [id number]) as of 02/23/2015 10:08  Ref. Range 02/22/2015 07:54 02/22/2015 12:04 02/22/2015 16:26 02/22/2015 16:34 02/22/2015 16:50 02/22/2015 21:56 02/23/2015 07:29  Glucose-Capillary Latest Ref Range: 65-99 mg/dL 310 (H) 228 (H) 33 (LL) 31 (LL) 92 163 (H) 183 (H)  It appears that glucose went low after Insulin treatment for hyperkalemia on 6/12.  CBG's improved today.    Per Medication Reconciliation Home meds:  Levemir 44 units daily, Novolog 15-35 units tid with meals Current medications:  Lantus 22 units daily, Novolog moderate tid with meals  Due to patient being NPO, consider changing Novolog correction to sensitive q 4 hours.     Thanks, Adah Perl, RN, BC-ADM Inpatient Diabetes Coordinator Pager [phone] (8a-5p)

## 2015-02-23 NOTE — Progress Notes (Signed)
[place] at [place] NAME: Deborah Obrien    MR#:  [id number]  DATE OF BIRTH:  September 10, 1941  SUBJECTIVE:  No acute issues overnight. Patient did have another episode of chest pain which is thought to be actually secondary to anxiety. REVIEW OF SYSTEMS:    Review of Systems  Constitutional: Negative for malaise/fatigue.  Respiratory: Negative for cough, sputum production and shortness of breath.   Cardiovascular: Negative for chest pain, palpitations, orthopnea, claudication and leg swelling.  Gastrointestinal: Positive for abdominal pain. Negative for nausea, vomiting and blood in stool.  Neurological: Positive for weakness. Negative for dizziness, tingling, tremors and headaches.  Psychiatric/Behavioral: The patient is not nervous/anxious.     Tolerating Diet: Nothing by mouth      DRUG ALLERGIES:   Allergies  Allergen Reactions  . Cephalosporins Other (See Comments)    Other Reaction: Intolerance  . Hydrocodone-Acetaminophen Hives  . Macrolides And Ketolides Other (See Comments)    Other Reaction: Intolerance  . Meperidine Nausea Only and Other (See Comments)    SOB  . Other Nausea Only, Rash and Other (See Comments)    Uncoded Allergy. Allergen: NON-STEROIDS, Other Reaction: Not Assessed bextra - hands and feet swell Shellfish - SOB Shellfish - SOB  . Sulfa Antibiotics     Other reaction(s): Other (See Comments) Other Reaction: Intolerance  . Aspirin Nausea Only and Other (See Comments)    stomach pain  . Celecoxib Other (See Comments)    GI bleed  . Cephalexin Diarrhea and Nausea Only  . Erythromycin Nausea And Vomiting    GI Upset  . Hydromorphone Other (See Comments)    hypotension,easy overdose hypotension,easy overdose  . Iodinated Diagnostic Agents Rash    CKD  . Oxycodone Other (See Comments)    easy overdose  . Ambrosia Artemisiifolia (Ragweed) Skin Test     Other reaction(s): Other (See  Comments) Other Reaction: Not Assessed  . Atorvastatin Nausea Only and Other (See Comments)    weakness  . Codeine Diarrhea and Nausea And Vomiting  . Prednisone     Other reaction(s): Other (See Comments) Blood sugar to go up.  . Valdecoxib     VITALS:  Blood pressure 100/46, pulse 63, temperature 98.3 F (36.8 C), temperature source Oral, resp. rate 19, height 5\' 4"  (1.626 m), weight 126.78 kg (279 lb 8 oz), SpO2 100 %.  PHYSICAL EXAMINATION:   Physical Exam  Constitutional: She is well-developed, well-nourished, and in no distress.  HENT:  Head: Normocephalic.  Eyes: No scleral icterus.  Neck: No tracheal deviation present.  Cardiovascular: Normal rate, regular rhythm and normal heart sounds.   Pulmonary/Chest: Effort normal and breath sounds normal. She has no wheezes.  Abdominal: She exhibits distension. She exhibits no mass. There is tenderness. There is no rebound and no guarding.  Musculoskeletal: She exhibits no edema.  Neurological: She is alert.  Skin: Skin is warm.  Psychiatric: Affect normal.      LABORATORY PANEL:   CBC  Recent Labs Lab 02/23/15 0422  WBC 8.9  HGB 9.1*  HCT 30.4*  PLT 167   ------------------------------------------------------------------------------------------------------------------  Chemistries   Recent Labs Lab 02/20/15 2047  02/23/15 0422  NA 138  < > 145  K 4.0  < > 5.3*  CL 104  < > 114*  CO2 25  < > 25  GLUCOSE 193*  < > 167*  BUN 42*  < > 34*  CREATININE 2.09*  < > 1.84*  CALCIUM 8.4*  < > 8.2*  AST 28  --   --   ALT 18  --   --   ALKPHOS 77  --   --   BILITOT 1.1  --   --   < > = values in this interval not displayed. ------------------------------------------------------------------------------------------------------------------  Cardiac Enzymes  Recent Labs Lab 02/22/15 0853 02/22/15 1621 02/22/15 2138  TROPONINI 0.08* 0.12* 0.16*    ------------------------------------------------------------------------------------------------------------------  RADIOLOGY:  Ct Abdomen Pelvis Wo Contrast  02/20/2015    IMPRESSION: 1. Low-moderate grade small bowel obstruction, approximately mid jejunum, with transition point in the mid abdomen just to the left of midline. 2. No other acute findings are evident. 3. Prior hysterectomy, cholecystectomy, sigmoidectomy and appendectomy. Unremarkable colonic anastomosis in the low pelvic midline.   Electronically Signed   By: Andreas Newport M.D.   On: 02/20/2015 21:52   Dg Chest 1 View  02/22/2015   IMPRESSION: No active disease.   Electronically Signed   By: Inez Catalina M.D.   On: 02/22/2015 09:15   Dg Abd 2 Views  02/21/2015   IMPRESSION: 1. Mild small bowel dilatation in the left abdomen, similar to recent CT and compatible with relatively low-grade obstruction as described on that study. 2. Enteric tube in the gastric body.   Electronically Signed   By: Logan Bores   On: 02/21/2015 08:10   EKG: Normal sinus rhythm. No ST elevation or depression.  02/23/2015 abdominal x-ray: Improved appearance of bowel gas pattern. NG tube is in distal stomach.  ASSESSMENT AND PLAN:   This is a 74 year old female with a history of atrial fibrillation, chronic kidney disease stage III and diabetes who was admitted to the surgical service due to SBO.    1. Shortness of breath and chest pain: Rapid response was called on June 12. I suspect that her symptoms are actually more related to anxiety. Her EKG showed no acute changes. Her troponin was slightly elevated. She was seen and evaluated with cardiology who did not feel that this is acute coronary syndrome. Patient will continue with Ativan for her anxiety.   2. Chronic kidney disease stage III: Her creatinine is at her baseline of 1.8-2.1  3. Paroxysmal atrial fibrillation: Cardiology is following. Patient will continue on amiodarone and  metoprolol. Anticoagulation was discontinued due to GI bleeding.  4. Diabetes: Continue increased dose of Lantus.. Continue sliding scale insulin. ADA diet when patient is able to take in by mouth has.    5. SBO: Her x-ray this morning looks like her SBO is improving. Further recommendations as per surgery.   6. Hyperkalemia: Patient's serum level is 5.3. I will treat with insulin and D50.  Management plans discussed with the patient and she is in agreement.  CODE STATUS: FULL  CRITICAL CARE TOTAL TIME TAKING CARE OF THIS PATIENT: 25 minutes.   POSSIBLE D/C IN 3-4 DAYS, DEPENDING ON CLINICAL CONDITION.   Pauline Trainer M.D on 02/23/2015 at 8:51 PM  Between 7am to 6pm - Pager - [phone] After 6pm go to [email or web address] - password [email or web address] [facility]  Office  [phone]  CC: Primary care physician; [name]., MD

## 2015-02-23 NOTE — Progress Notes (Signed)
[place] at [place] NAME: Deborah Obrien    MR#:  [id number]  DATE OF BIRTH:  13-Jul-1941  SUBJECTIVE:  No acute issues overnight. Patient did have another episode of chest pain which is thought to be actually secondary to anxiety. REVIEW OF SYSTEMS:    Review of Systems  Constitutional: Negative for malaise/fatigue.  Respiratory: Negative for cough, sputum production and shortness of breath.   Cardiovascular: Negative for chest pain, palpitations, orthopnea, claudication and leg swelling.  Gastrointestinal: Positive for abdominal pain. Negative for nausea, vomiting and blood in stool.  Neurological: Positive for weakness. Negative for dizziness, tingling, tremors and headaches.  Psychiatric/Behavioral: The patient is not nervous/anxious.     Tolerating Diet: Nothing by mouth      DRUG ALLERGIES:   Allergies  Allergen Reactions  . Cephalosporins Other (See Comments)    Other Reaction: Intolerance  . Hydrocodone-Acetaminophen Hives  . Macrolides And Ketolides Other (See Comments)    Other Reaction: Intolerance  . Meperidine Nausea Only and Other (See Comments)    SOB  . Other Nausea Only, Rash and Other (See Comments)    Uncoded Allergy. Allergen: NON-STEROIDS, Other Reaction: Not Assessed bextra - hands and feet swell Shellfish - SOB Shellfish - SOB  . Sulfa Antibiotics     Other reaction(s): Other (See Comments) Other Reaction: Intolerance  . Aspirin Nausea Only and Other (See Comments)    stomach pain  . Celecoxib Other (See Comments)    GI bleed  . Cephalexin Diarrhea and Nausea Only  . Erythromycin Nausea And Vomiting    GI Upset  . Hydromorphone Other (See Comments)    hypotension,easy overdose hypotension,easy overdose  . Iodinated Diagnostic Agents Rash    CKD  . Oxycodone Other (See Comments)    easy overdose  . Ambrosia Artemisiifolia (Ragweed) Skin Test     Other reaction(s): Other (See  Comments) Other Reaction: Not Assessed  . Atorvastatin Nausea Only and Other (See Comments)    weakness  . Codeine Diarrhea and Nausea And Vomiting  . Prednisone     Other reaction(s): Other (See Comments) Blood sugar to go up.  . Valdecoxib     VITALS:  Blood pressure 105/42, pulse 64, temperature 98.4 F (36.9 C), temperature source Oral, resp. rate 20, height 5\' 4"  (1.626 m), weight 126.78 kg (279 lb 8 oz), SpO2 100 %.  PHYSICAL EXAMINATION:   Physical Exam  Constitutional: She is well-developed, well-nourished, and in no distress.  HENT:  Head: Normocephalic.  Eyes: No scleral icterus.  Neck: No tracheal deviation present.  Cardiovascular: Normal rate, regular rhythm and normal heart sounds.   Pulmonary/Chest: Effort normal and breath sounds normal. She has no wheezes.  Abdominal: She exhibits distension. She exhibits no mass. There is tenderness. There is no rebound and no guarding.  Musculoskeletal: She exhibits no edema.  Neurological: She is alert.  Skin: Skin is warm.  Psychiatric: Affect normal.      LABORATORY PANEL:   CBC  Recent Labs Lab 02/23/15 0422  WBC 8.9  HGB 9.1*  HCT 30.4*  PLT 167   ------------------------------------------------------------------------------------------------------------------  Chemistries   Recent Labs Lab 02/20/15 2047  02/23/15 0422  NA 138  < > 145  K 4.0  < > 5.3*  CL 104  < > 114*  CO2 25  < > 25  GLUCOSE 193*  < > 167*  BUN 42*  < > 34*  CREATININE 2.09*  < > 1.84*  CALCIUM 8.4*  < > 8.2*  AST 28  --   --   ALT 18  --   --   ALKPHOS 77  --   --   BILITOT 1.1  --   --   < > = values in this interval not displayed. ------------------------------------------------------------------------------------------------------------------  Cardiac Enzymes  Recent Labs Lab 02/22/15 0853 02/22/15 1621 02/22/15 2138  TROPONINI 0.08* 0.12* 0.16*    ------------------------------------------------------------------------------------------------------------------  RADIOLOGY:  Ct Abdomen Pelvis Wo Contrast  02/20/2015    IMPRESSION: 1. Low-moderate grade small bowel obstruction, approximately mid jejunum, with transition point in the mid abdomen just to the left of midline. 2. No other acute findings are evident. 3. Prior hysterectomy, cholecystectomy, sigmoidectomy and appendectomy. Unremarkable colonic anastomosis in the low pelvic midline.   Electronically Signed   By: Andreas Newport M.D.   On: 02/20/2015 21:52   Dg Chest 1 View  02/22/2015   IMPRESSION: No active disease.   Electronically Signed   By: Inez Catalina M.D.   On: 02/22/2015 09:15   Dg Abd 2 Views  02/21/2015   IMPRESSION: 1. Mild small bowel dilatation in the left abdomen, similar to recent CT and compatible with relatively low-grade obstruction as described on that study. 2. Enteric tube in the gastric body.   Electronically Signed   By: Logan Bores   On: 02/21/2015 08:10   EKG: Normal sinus rhythm. No ST elevation or depression.  02/23/2015 abdominal x-ray: Improved appearance of bowel gas pattern. NG tube is in distal stomach.  ASSESSMENT AND PLAN:   This is a 74 year old female with a history of atrial fibrillation, chronic kidney disease stage III and diabetes who was admitted to the surgical service due to SBO.    1. Shortness of breath and chest pain: Rapid response was called on June 12. I suspect that her symptoms are actually more related to anxiety. Her EKG showed no acute changes. Her troponin was slightly elevated. She was seen and evaluated with cardiology who did not feel that this is acute coronary syndrome. Patient will continue with Ativan for her anxiety.   2. Chronic kidney disease stage III: Her creatinine is at her baseline of 1.8-2.1  3. Paroxysmal atrial fibrillation: Cardiology is following. Patient will continue on amiodarone and  metoprolol. Anticoagulation was discontinued due to GI bleeding.  4. Diabetes: Continue increased dose of Lantus.. Continue sliding scale insulin. ADA diet when patient is able to take in by mouth has.    5. SBO: Her x-ray this morning looks like her SBO is improving. Further recommendations as per surgery.   6. Hyperkalemia: Patient's serum level is 5.3. I will treat with insulin and D50.  Management plans discussed with the patient and she is in agreement.  CODE STATUS: FULL  CRITICAL CARE TOTAL TIME TAKING CARE OF THIS PATIENT: 25 minutes.   POSSIBLE D/C IN 3-4 DAYS, DEPENDING ON CLINICAL CONDITION.   Latanza Pfefferkorn M.D on 02/23/2015 at 10:56 AM  Between 7am to 6pm - Pager - [phone] After 6pm go to [email or web address] - password [email or web address] [facility]  Office  [phone]  CC: Primary care physician; [name]., MD

## 2015-02-23 NOTE — Care Management Note (Addendum)
Case Management Note  Patient Details  Name: Deborah Obrien MRN: [id number] Date of Birth: September 24, 1940  Subjective/Objective:    74yo Deborah Obrien remains anxious but per MD notes her SBO is improving. Today she is receiving milk of magnesia and a Fleets enema. Anticipate discharge within 3-4 days home with home health PT and RN. Discussed discharge planning with patient and husband and they have used Deborah Obrien in the past and want Deborah Obrien to be their home health provider. Deborah Obrien has a rolling walker and a bedside commode at home. Case management will follow for discharge planning.               Action/Plan:   Expected Discharge Date:                  Expected Discharge Plan:     In-House Referral:     Discharge planning Services     Post Acute Care Choice:    Choice offered to:     DME Arranged:    DME Agency:     HH Arranged:    [place] [facility]:     Status of Service:     Medicare Important Message Given:    Date Medicare IM Given:    Medicare IM give by:    Date Additional Medicare IM Given:    Additional Medicare Important Message give by:     If discussed at [place] of Stay Meetings, dates discussed:    Additional Comments:  Tuff Clabo A, RN 02/23/2015, 3:41 PM

## 2015-02-24 DIAGNOSIS — K565 Intestinal adhesions [bands] with obstruction (postprocedural) (postinfection): Secondary | ICD-10-CM

## 2015-02-24 DIAGNOSIS — E119 Type 2 diabetes mellitus without complications: Secondary | ICD-10-CM

## 2015-02-24 LAB — COMPREHENSIVE METABOLIC PANEL
ALBUMIN: 3 g/dL — AB (ref 3.5–5.0)
ALT: 19 U/L (ref 14–54)
AST: 28 U/L (ref 15–41)
Alkaline Phosphatase: 72 U/L (ref 38–126)
Anion gap: 9 (ref 5–15)
BUN: 27 mg/dL — ABNORMAL HIGH (ref 6–20)
CALCIUM: 8.9 mg/dL (ref 8.9–10.3)
CHLORIDE: 112 mmol/L — AB (ref 101–111)
CO2: 28 mmol/L (ref 22–32)
CREATININE: 1.47 mg/dL — AB (ref 0.44–1.00)
GFR calc Af Amer: 39 mL/min — ABNORMAL LOW (ref >60)
GFR calc non Af Amer: 34 mL/min — ABNORMAL LOW (ref >60)
Glucose, Bld: 118 mg/dL — ABNORMAL HIGH (ref 65–99)
Potassium: 4.8 mmol/L (ref 3.5–5.1)
Sodium: 149 mmol/L — ABNORMAL HIGH (ref 135–145)
Total Bilirubin: 0.7 mg/dL (ref 0.3–1.2)
Total Protein: 6 g/dL — ABNORMAL LOW (ref 6.5–8.1)

## 2015-02-24 LAB — CBC WITH DIFFERENTIAL/PLATELET
BASOS ABS: 0.1 10*3/uL (ref 0–0.1)
Basophils Relative: 1 %
Eosinophils Absolute: 0.2 10*3/uL (ref 0–0.7)
HEMATOCRIT: 31.8 % — AB (ref 35.0–47.0)
Hemoglobin: 9.3 g/dL — ABNORMAL LOW (ref 12.0–16.0)
Lymphocytes Relative: 20 %
Lymphs Abs: 1.3 10*3/uL (ref 1.0–3.6)
MCH: 24.7 pg — ABNORMAL LOW (ref 26.0–34.0)
MCHC: 29.4 g/dL — ABNORMAL LOW (ref 32.0–36.0)
MCV: 84.1 fL (ref 80.0–100.0)
Monocytes Absolute: 0.7 10*3/uL (ref 0.2–0.9)
Monocytes Relative: 12 %
Neutro Abs: 4.1 10*3/uL (ref 1.4–6.5)
Neutrophils Relative %: 65 %
Platelets: 198 10*3/uL (ref 150–440)
RBC: 3.78 MIL/uL — ABNORMAL LOW (ref 3.80–5.20)
RDW: 18.3 % — AB (ref 11.5–14.5)
WBC: 6.4 10*3/uL (ref 3.6–11.0)

## 2015-02-24 LAB — GLUCOSE, CAPILLARY
GLUCOSE-CAPILLARY: 149 mg/dL — AB (ref 65–99)
Glucose-Capillary: 144 mg/dL — ABNORMAL HIGH (ref 65–99)
Glucose-Capillary: 98 mg/dL (ref 65–99)
Glucose-Capillary: 151 mg/dL — ABNORMAL HIGH (ref 65–99)

## 2015-02-24 NOTE — Progress Notes (Signed)
CC: Resolving small bowel obstruction Subjective: Patient denies any abdominal pain today has passed some gas and had a small bowel movement has no further nausea or vomiting.  Objective: Vital signs in last 24 hours: Temp:  [97.5 F (36.4 C)-98.6 F (37 C)] 98.6 F (37 C) (06/14 1521) Pulse Rate:  [61-66] 61 (06/14 1521) Resp:  [14] 14 16-Mar-2023 2325) BP: (105-137)/(41-87) 124/41 mmHg (06/14 1521) SpO2:  [100 %] 100 % (06/14 1521) Last BM Date: 02/20/15  Intake/Output from previous day: Mar 16, 2023 0701 - 06/14 0700 In: 1161.5 [I.V.:1101.5; NG/GT:60] Out: 425 [Urine:200; Emesis/NG output:225] Intake/Output this shift: Total I/O In: 497 [P.O.:30; I.V.:427; NG/GT:40] Out: 0   Physical exam:  Morbidly obese patient,  soft abdomen nondistended non-tympanitic and nontender.  Calves are nontender.  Lab Results: CBC   Recent Labs  Mar 16, 2015 0422 02/24/15 0437  WBC 8.9 6.4  HGB 9.1* 9.3*  HCT 30.4* 31.8*  PLT 167 198   BMET  Recent Labs  March 16, 2015 0422 02/24/15 0437  NA 145 149*  K 5.3* 4.8  CL 114* 112*  CO2 25 28  GLUCOSE 167* 118*  BUN 34* 27*  CREATININE 1.84* 1.47*  CALCIUM 8.2* 8.9   PT/INR No results for input(s): LABPROT, INR in the last 72 hours. ABG No results for input(s): PHART, HCO3 in the last 72 hours.  Invalid input(s): PCO2, PO2  Studies/Results: Dg Abd 2 Views  03-16-15   CLINICAL DATA:  Abdominal pain for 1 week  EXAM: ABDOMEN - 2 VIEW  COMPARISON:  02/21/2015  FINDINGS: Scattered large and small bowel gas is noted. Nasogastric catheter is noted within the distal stomach. The degree of small bowel dilatation has resolved in the interval from the prior study. No free air is seen. Postsurgical changes in the pelvis and lumbar spine are noted.  IMPRESSION: Improved appearance of the bowel gas pattern.  Nasogastric catheter remains in distal stomach.   Electronically Signed   By: Inez Catalina M.D.   On: 03-16-15 10:14     Anti-infectives: Anti-infectives    None      Assessment/Plan:  Resolving partial small bowel obstruction certainly no sign of the need for surgery at this point. I will advance diet after discontinuing her nasogastric tube. Possibly home tomorrow  Florene Glen, MD, FACS  02/24/2015

## 2015-02-24 NOTE — Progress Notes (Signed)
Inpatient Diabetes Program Recommendations  AACE/ADA: New Consensus Statement on Inpatient Glycemic Control (2013)  Target Ranges:  Prepandial:   less than 140 mg/dL      [place] postprandial:   less than 180 mg/dL (1-2 hours)      Critically ill patients:  140 - 180 mg/dL   Reason for Visit: clarify home meds  Diabetes history: Type 2 Outpatient Diabetes medications: unsure- home medications lists Novolog 15 units-35 units tid, Levemir 40 units qhs and Lantus 38 units qhs Current orders for Inpatient glycemic control: Lantus 22 units q day, Novolog 0-15 units tid with meals  Patient is unable to communicate what medications she is taking for diabetes- when asked how much Lantus she is taking she stated " Lantus, I'm taking Novlog 15 units".  When I asked how much Levemir she was taking, she stated "40 units" by pen at bedtime.  Current inpatient orders ideal at this time- CBG ideal.   Unsure of her living arrangements or discharge plans, but based on this patient's current mental status(confusion), I would be concerned sending her home on insulin.  Gentry Fitz, RN, BA, MHA, CDE Diabetes Coordinator Inpatient Diabetes Program  [phone] (Team Pager) [phone] ([place]) 02/24/2015 3:03 PM

## 2015-02-24 NOTE — Progress Notes (Signed)
[facility] [place] NAME: Deborah Obrien    MR#:  [id number]  DATE OF BIRTH:  11/07/1950  SUBJECTIVE:  Patient is doing much better this morning. Patient is passing gas. Patient denies abdominal pain, nausea or vomiting. REVIEW OF SYSTEMS:    Review of Systems  Constitutional: Negative for malaise/fatigue.  Respiratory: Negative for cough, sputum production and shortness of breath.   Cardiovascular: Negative for chest pain, palpitations, orthopnea, claudication and leg swelling.  Gastrointestinal: Negative for nausea, vomiting, abdominal pain, diarrhea, constipation and blood in stool.  Neurological: Positive for weakness. Negative for dizziness, tingling, tremors and headaches.  Psychiatric/Behavioral: Negative for depression. The patient is not nervous/anxious.     Tolerating Diet: Nothing by mouth      DRUG ALLERGIES:   Allergies  Allergen Reactions  . Cephalosporins Other (See Comments)    Other Reaction: Intolerance  . Hydrocodone-Acetaminophen Hives  . Macrolides And Ketolides Other (See Comments)    Other Reaction: Intolerance  . Meperidine Nausea Only and Other (See Comments)    SOB  . Other Nausea Only, Rash and Other (See Comments)    Uncoded Allergy. Allergen: NON-STEROIDS, Other Reaction: Not Assessed bextra - hands and feet swell Shellfish - SOB Shellfish - SOB  . Sulfa Antibiotics     Other reaction(s): Other (See Comments) Other Reaction: Intolerance  . Aspirin Nausea Only and Other (See Comments)    stomach pain  . Celecoxib Other (See Comments)    GI bleed  . Cephalexin Diarrhea and Nausea Only  . Erythromycin Nausea And Vomiting    GI Upset  . Hydromorphone Other (See Comments)    hypotension,easy overdose hypotension,easy overdose  . Iodinated Diagnostic Agents Rash    CKD  . Oxycodone Other (See Comments)    easy overdose  . Ambrosia Artemisiifolia (Ragweed) Skin Test     Other  reaction(s): Other (See Comments) Other Reaction: Not Assessed  . Atorvastatin Nausea Only and Other (See Comments)    weakness  . Codeine Diarrhea and Nausea And Vomiting  . Prednisone     Other reaction(s): Other (See Comments) Blood sugar to go up.  . Valdecoxib     VITALS:  Blood pressure 137/66, pulse 66, temperature 97.5 F (36.4 C), temperature source Oral, resp. rate 14, height 5\' 4"  (1.626 m), weight 126.78 kg (279 lb 8 oz), SpO2 100 %.  PHYSICAL EXAMINATION:   Physical Exam  Constitutional: She is well-developed, well-nourished, and in no distress.  HENT:  Head: Normocephalic.  Eyes: No scleral icterus.  Neck: No tracheal deviation present.  Cardiovascular: Normal rate, regular rhythm and normal heart sounds.   Pulmonary/Chest: Effort normal and breath sounds normal. She has no wheezes.  Abdominal: Soft. Bowel sounds are normal. She exhibits no distension and no mass. There is no tenderness. There is no rebound and no guarding.  Musculoskeletal: She exhibits no edema.  Neurological: She is alert.  Skin: Skin is warm.  Psychiatric: Affect normal.      LABORATORY PANEL:   CBC  Recent Labs Lab 02/24/15 0437  WBC 6.4  HGB 9.3*  HCT 31.8*  PLT 198   ------------------------------------------------------------------------------------------------------------------  Chemistries   Recent Labs Lab 02/24/15 0437  NA 149*  K 4.8  CL 112*  CO2 28  GLUCOSE 118*  BUN 27*  CREATININE 1.47*  CALCIUM 8.9  AST 28  ALT 19  ALKPHOS 72  BILITOT 0.7   ------------------------------------------------------------------------------------------------------------------  Cardiac Enzymes  Recent Labs Lab 02/22/15  [id number] 02/22/15 1621 02/22/15 2138  TROPONINI 0.08* 0.12* 0.16*   ------------------------------------------------------------------------------------------------------------------  RADIOLOGY:  Ct Abdomen Pelvis Wo Contrast  02/20/2015     IMPRESSION: 1. Low-moderate grade small bowel obstruction, approximately mid jejunum, with transition point in the mid abdomen just to the left of midline. 2. No other acute findings are evident. 3. Prior hysterectomy, cholecystectomy, sigmoidectomy and appendectomy. Unremarkable colonic anastomosis in the low pelvic midline.   Electronically Signed   By: Andreas Newport M.D.   On: 02/20/2015 21:52   Dg Chest 1 View  02/22/2015   IMPRESSION: No active disease.   Electronically Signed   By: Inez Catalina M.D.   On: 02/22/2015 09:15   Dg Abd 2 Views  02/21/2015   IMPRESSION: 1. Mild small bowel dilatation in the left abdomen, similar to recent CT and compatible with relatively low-grade obstruction as described on that study. 2. Enteric tube in the gastric body.   Electronically Signed   By: Logan Bores   On: 02/21/2015 08:10   EKG: Normal sinus rhythm. No ST elevation or depression.  02/23/2015 abdominal x-ray: Improved appearance of bowel gas pattern. NG tube is in distal stomach.  ASSESSMENT AND PLAN:   This is a 74 year old female with a history of atrial fibrillation, chronic kidney disease stage III and diabetes who was admitted to the surgical service due to SBO.    1. Shortness of breath and chest pain: Rapid response was called on June 12. I suspect that her symptoms are actually more related to anxiety. Her EKG showed no acute changes. Her troponin was slightly elevated. She was seen and evaluated with cardiology who did not feel that this is acute coronary syndrome. Patient will continue with Ativan for her anxiety.   2. Chronic kidney disease stage III: Her creatinine is at her baseline of 1.8-2.1  3. Paroxysmal atrial fibrillation: Cardiology is following. Patient will continue on amiodarone and metoprolol. Anticoagulation was discontinued due to GI bleeding.  4. Diabetes: Blood sugars are better controlled. I would continue current dose of Lantus. Continue sliding scale insulin.  ADA diet when patient is able to take in by mouth has.    5. SBO: Small bowel obstruction appears to be improving. Patient has good bowel sounds and nondistended abdomen this morning.. Further recommendations as per surgery.   6. Hyperkalemia: Resolved  Management plans discussed with the patient and she is in agreement.  CODE STATUS: FULL  TOTAL TIME TAKING CARE OF THIS PATIENT: 22 minutes.   POSSIBLE D/C IN 2-3 DAYS, DEPENDING ON CLINICAL CONDITION. 50 % time sent in coordination and communication  Carey Johndrow M.D on 02/24/2015 at 11:37 AM  Between 7am to 6pm - Pager - [phone] After 6pm go to [email or web address] - password [email or web address] [facility]  Office  [phone]  CC: Primary care physician; [name]., MD

## 2015-02-25 ENCOUNTER — Encounter
Admission: RE | Admit: 2015-02-25 | Discharge: 2015-02-25 | Disposition: A | Discharge status: Home / Self Care | Payer: Medicare Other | Source: Ambulatory Visit | Attending: Internal Medicine | Admitting: Internal Medicine

## 2015-02-25 DIAGNOSIS — K565 Intestinal adhesions [bands] with obstruction (postprocedural) (postinfection): Secondary | ICD-10-CM

## 2015-02-25 DIAGNOSIS — E119 Type 2 diabetes mellitus without complications: Secondary | ICD-10-CM

## 2015-02-25 LAB — BASIC METABOLIC PANEL
Anion gap: 6 (ref 5–15)
BUN: 24 mg/dL — AB (ref 6–20)
CHLORIDE: 111 mmol/L (ref 101–111)
CO2: 28 mmol/L (ref 22–32)
CREATININE: 1.32 mg/dL — AB (ref 0.44–1.00)
Calcium: 8.8 mg/dL — ABNORMAL LOW (ref 8.9–10.3)
GFR calc non Af Amer: 39 mL/min — ABNORMAL LOW (ref >60)
GFR, EST AFRICAN AMERICAN: 45 mL/min — AB (ref >60)
GLUCOSE: 120 mg/dL — AB (ref 65–99)
Potassium: 4.2 mmol/L (ref 3.5–5.1)
Sodium: 145 mmol/L (ref 135–145)

## 2015-02-25 LAB — GLUCOSE, CAPILLARY
GLUCOSE-CAPILLARY: 170 mg/dL — AB (ref 65–99)
Glucose-Capillary: 195 mg/dL — ABNORMAL HIGH (ref 65–99)
Glucose-Capillary: 227 mg/dL — ABNORMAL HIGH (ref 65–99)

## 2015-02-25 NOTE — Progress Notes (Signed)
Pt d/c to home rehab facility today [facility].  IV removed intact.  Pt was transported by EMS.  Attempted to call report to receiving RN but was unable to reach someone.  Called back and left a message with the secretary Judie Petit to have RN contact me for report.

## 2015-02-25 NOTE — Progress Notes (Signed)
CC resolved partial small bowel obstruction Subjective: Partial small bowel obstruction resolved. Patient tolerating a regular diet at this point and passing gas without difficulty.  Objective: Vital signs in last 24 hours: Temp:  [97.9 F (36.6 C)-98.7 F (37.1 C)] 97.9 F (36.6 C) (06/15 0749) Pulse Rate:  [54-61] 54 (06/15 0749) Resp:  [16] 16 (06/15 0749) BP: (113-135)/(41-68) 135/51 mmHg (06/15 0749) SpO2:  [94 %-100 %] 95 % (06/15 0749) Last BM Date: 02/20/15  Intake/Output from previous day: 06/14 0701 - 06/15 0700 In: [id number] [P.O.:510; I.V.:887; NG/GT:40] Out: 170 [Urine:150; Emesis/NG output:20] Intake/Output this shift: Total I/O In: -  Out: 300 [Urine:300]  Physical exam:  Morbidly obese  soft abdomen nontender nondistended. Nontender calves  Lab Results: CBC   Recent Labs  03-12-2015 0422 02/24/15 0437  WBC 8.9 6.4  HGB 9.1* 9.3*  HCT 30.4* 31.8*  PLT 167 198   BMET  Recent Labs  02/24/15 0437 02/25/15 0440  NA 149* 145  K 4.8 4.2  CL 112* 111  CO2 28 28  GLUCOSE 118* 120*  BUN 27* 24*  CREATININE 1.47* 1.32*  CALCIUM 8.9 8.8*   PT/INR No results for input(s): LABPROT, INR in the last 72 hours. ABG No results for input(s): PHART, HCO3 in the last 72 hours.  Invalid input(s): PCO2, PO2  Studies/Results: Dg Abd 2 Views  03-12-2015   CLINICAL DATA:  Abdominal pain for 1 week  EXAM: ABDOMEN - 2 VIEW  COMPARISON:  02/21/2015  FINDINGS: Scattered large and small bowel gas is noted. Nasogastric catheter is noted within the distal stomach. The degree of small bowel dilatation has resolved in the interval from the prior study. No free air is seen. Postsurgical changes in the pelvis and lumbar spine are noted.  IMPRESSION: Improved appearance of the bowel gas pattern.  Nasogastric catheter remains in distal stomach.   Electronically Signed   By: Inez Catalina M.D.   On: 03/12/15 10:14    Anti-infectives: Anti-infectives    None       Assessment/Plan:  Patient doing very well with no sign of current small bowel obstruction her symptoms have completely resolved. She will be discharged today on a regular diet to follow-up in 10 days or as needed.  Florene Glen, MD, FACS  02/25/2015

## 2015-02-25 NOTE — Clinical Social Work Note (Signed)
Clinical Social Work Assessment  Patient Details  Name: Deborah Obrien MRN: [id number] Date of Birth: 1940/11/10  Date of referral:  02/25/15               Reason for consult:  Facility Placement                Permission sought to share information with:  Facility Sport and exercise psychologist, Family Supports Permission granted to share information::  Yes, Verbal Permission Granted  Name::        Agency::     Relationship::     Contact Information:     Housing/Transportation Living arrangements for the past 2 months:  Single Family Home Source of Information:  Patient, Spouse Patient Interpreter Needed:  None Criminal Activity/Legal Involvement Pertinent to Current Situation/Hospitalization:  No - Comment as needed Significant Relationships:  Spouse Lives with:  Pets, Spouse Do you feel safe going back to the place where you live?  No Need for family participation in patient care:  No (Coment)  Care giving concerns:  Pt and pt's husband are concerned that pt will not be able to complete her ADL when home.  Are in agreement with PT recommendation of SNF   Social Worker assessment / plan:  CSW spoke to pt.  She was sitting up in bed alert and Ox3.  She confirmed that she did live in a single home with her husband and pet.  She was in agreement with SNF placement, as was her husband who was also in the room at the time of the assessment.  Husband stated that with pt current physical limitations he is unable to care for her.  Pt agreed with PT's recommendation for SNF placement.  CSW received verbal notification of PT consult from [facility].  Family wants [facility]  Per [facility] is able to take pt.    Employment status:  Retired Forensic scientist:  [employer] PT Recommendations:  [place] / Referral to community resources:     Patient/Family's Response to care:  Pt and pt's husband are both in agreement with SNF placement for  STR  Patient/Family's Understanding of and Emotional Response to Diagnosis, Current Treatment, and Prognosis:  Pt and husband both understood and agreed with SNF placement.    Emotional Assessment Appearance:  Appears younger than stated age Attitude/Demeanor/Rapport:   (Pleasent) Affect (typically observed):   (Normal) Orientation:  Oriented to Self, Oriented to Place, Oriented to  Time, Oriented to Situation Alcohol / Substance use:  Never Used Psych involvement (Current and /or in the community):  No (Comment)  Discharge Needs  Concerns to be addressed:  Care Coordination Readmission within the last 30 days:  Yes Current discharge risk:  Physical Impairment Barriers to Discharge:      Mathews Argyle, LCSW 02/25/2015, 12:08 PM

## 2015-02-25 NOTE — Discharge Summary (Signed)
Physician Discharge Summary  Patient ID: Deborah Obrien MRN: [id number] DOB/AGE: 12-Aug-1941 74 y.o.  Admit date: 02/20/2015 Discharge date: 02/25/2015   Discharge Diagnoses:  Active Problems:   Intestinal obstruction   Discharged Condition: stable  Procedures: None  Hospital Course: Patient admitted the hospital with signs of a partial small bowel obstruction which has completely resolved. She was treated initially with a nasogastric tube and then started passing gas and having bowel movements. She is currently tolerating a regular diet. She'll be discharged in stable condition with instructions to follow up with her primary care physician in the next 2 weeks or with [place] as needed.  Consults: [facility] doc  Significant Diagnostic Studies: CT scan   Disposition: 01-Home or Self Care     Medication List    ASK your doctor about these medications        acetaminophen 500 MG tablet  Commonly known as:  TYLENOL  Take 1,000 mg by mouth every 6 (six) hours as needed for moderate pain.     amiodarone 200 MG tablet  Commonly known as:  PACERONE  Take 200 mg by mouth daily.     CALCIUM 600 + D PO  Take 1 tablet by mouth 2 (two) times daily.     Cranberry-Vitamin C-Vitamin E 4200-20-3 MG-MG-UNIT Caps  Take 2 tablets by mouth 2 (two) times daily.     docusate sodium 100 MG capsule  Commonly known as:  COLACE  Take 200 mg by mouth at bedtime.     furosemide 40 MG tablet  Commonly known as:  LASIX  Take 20 mg by mouth daily.     hydrocortisone 2.5 % cream  Apply 1 application topically at bedtime. Apply to rash under breast     insulin aspart 100 UNIT/ML injection  Commonly known as:  novoLOG  Inject 15-35 Units into the skin 3 (three) times daily with meals. Blood glucose 0-150= 15 units 151-200= 17 units 201-250= 19 units 251-300= 23 units 301-350= 27 units 351-400= 31 units 401-up= 35 units     insulin detemir 100 UNIT/ML injection  Commonly known as:  LEVEMIR  Inject  40 Units into the skin at bedtime.     insulin glargine 100 UNIT/ML injection  Commonly known as:  LANTUS  Inject 38 Units into the skin at bedtime.     ketoconazole 2 % cream  Commonly known as:  NIZORAL  Apply 1 application topically every 2 (two) hours. Apply to rash under breast     metoprolol tartrate 25 MG tablet  Commonly known as:  LOPRESSOR  Take 0.5 tablets by mouth 2 (two) times daily.     MULTI-VITAMINS Tabs  Take 1 tablet by mouth daily.     oxybutynin 10 MG 24 hr tablet  Commonly known as:  DITROPAN-XL  Take 10 mg by mouth at bedtime.     pantoprazole 40 MG tablet  Commonly known as:  PROTONIX  Take 40 mg by mouth 2 (two) times daily.     PARoxetine 20 MG tablet  Commonly known as:  PAXIL  Take 20 mg by mouth daily.     potassium chloride 10 MEQ tablet  Commonly known as:  K-DUR  Take 1 tablet by mouth every morning.     pravastatin 20 MG tablet  Commonly known as:  PRAVACHOL  Take 1 tablet by mouth every evening.     sucralfate 1 G tablet  Commonly known as:  CARAFATE  Take 1 tablet by mouth 4 (four)  times daily -  before meals and at bedtime.     vitamin D (CHOLECALCIFEROL) 400 UNITS tablet  Take 1-2 tablets by mouth 2 (two) times daily. 2 tabs in the morning and 1 at night         Florene Glen, MD, FACS

## 2015-02-25 NOTE — Evaluation (Signed)
Physical Therapy Evaluation Patient Details Name: Deborah Obrien MRN: [id number] DOB: [birth year] Today's Date: 02/25/2015   History of Present Illness  Pt was admitted with complaints of N/V and acute abdominal pain. She was admitted for partial SBO which has now resolved. PMH: a-fib, CKD, DM, and GI bleed. At baseline pt is a limited community ambulator without assistive device. Denies falls in the last 12 months  Clinical Impression  Pt demonstrates significant weakness and deconditioning likely from prolonged bedrest and illness. She is very unsteady on her feet with frequent buckling of knees. At end of session pt too weak to return to recliner and recliner must be brought underneath her to prevent her from falling. Pt is unable to return home at this time and will need SNF placement in order to facilitate safe return to prior level of function at home. Pt will benefit from skilled PT services to address deficits in strength, balance, and mobility in order to return to full function at home.     Follow Up Recommendations SNF    Equipment Recommendations  None recommended by PT    Recommendations for Other Services       Precautions / Restrictions Precautions Precautions: Fall Restrictions Weight Bearing Restrictions: No      Mobility  Bed Mobility               General bed mobility comments: Received up in recliner and left in recliner. Bed mobility not assessed  Transfers Overall transfer level: Needs assistance Equipment used: Rolling walker (2 wheeled) Transfers: Sit to/from Stand Sit to Stand: +2 physical assistance;Min assist         General transfer comment: Pt with decreased LE power and strength noted. Extended time required to come to standing and poor balance noted due to weakness  Ambulation/Gait Ambulation/Gait assistance: Mod assist;+2 physical assistance Ambulation Distance (Feet): 8 Feet Assistive device: Rolling walker (2 wheeled) Gait  Pattern/deviations: Step-to pattern;Decreased step length - right;Decreased step length - left;Shuffle;Antalgic   Gait velocity interpretation: <1.8 ft/sec, indicative of risk for recurrent falls General Gait Details: Gait is very slow and labored. Antalgic on R with pt reporting R knee pain. Multiple episodes of LE buckling with gradually worsening fatigue and slowing speed. At the end of ambulation distance patient's knees start to buckle and she doesen't have adequate strength to return to recliner. Recliner brought underneath patient due to concerns of falling  Stairs            Wheelchair Mobility    Modified Rankin (Stroke Patients Only)       Balance                                             Pertinent Vitals/Pain Pain Assessment: No/denies pain (reports discomfort in R foot where IV is located)    Home Living Family/patient expects to be discharged to:: Private residence Living Arrangements: Spouse/significant other Available Help at Discharge: Family Type of Home: House Home Access: Ramped entrance     Home Layout: One level Home Equipment: Environmental consultant - 2 wheels;Cane - single point;Bedside commode;Tub bench (grab bars in shower)      Prior Function Level of Independence: Independent               Hand Dominance        Extremity/Trunk Assessment   Upper Extremity Assessment: Generalized weakness  Lower Extremity Assessment: Generalized weakness;RLE deficits/detail RLE Deficits / Details: bilateral hip flexion 4-/5. R knee flexion/extension 4-/5 MMT due to pain however functional strength is better when standing. R ankle DF is 4-/5 due to pain from IV site. LLE is grossly 4 to 4+/5 throughout       Communication   Communication: No difficulties  Cognition Arousal/Alertness: Awake/alert Behavior During Therapy: Anxious;WFL for tasks assessed/performed Overall Cognitive Status: Within Functional Limits for tasks  assessed                      General Comments      Exercises        Assessment/Plan    PT Assessment Patient needs continued PT services  PT Diagnosis Difficulty walking;Generalized weakness   PT Problem List Decreased strength;Decreased activity tolerance;Decreased balance;Decreased mobility;Decreased safety awareness;Obesity  PT Treatment Interventions DME instruction;Gait training;Functional mobility training;Therapeutic exercise;Therapeutic activities;Balance training;Neuromuscular re-education   PT Goals (Current goals can be found in the Care Plan section) Acute Rehab PT Goals Patient Stated Goal: "I want to go home" PT Goal Formulation: With patient/family Time For Goal Achievement: 03/11/15 Potential to Achieve Goals: Good    Frequency Min 2X/week   Barriers to discharge   Pt unsafe, high risk for falls, and unable to ambulate far enough to enter home or for functional household distances due to weakness and deconditioning    Co-evaluation               End of Session Equipment Utilized During Treatment: Gait belt Activity Tolerance: Patient limited by fatigue;Patient limited by lethargy Patient left: in chair;with call bell/phone within reach;with chair alarm set;with family/visitor present Nurse Communication:  (Nurse unavailable by phone. Unable to find. +2 on board)         Time: [id number] PT Time Calculation (min) (ACUTE ONLY): 17 min   Charges:   PT Evaluation $Initial PT Evaluation Tier I: 1 Procedure     PT G Codes:       Lyndel Safe Danie Hannig PT, DPT   Sidharth Leverette 02/25/2015, 10:28 AM

## 2015-02-25 NOTE — Discharge Instructions (Signed)
Resume normal activities. May shower. Restart all home medications. Follow-up with primary care physician in 7-10 days.

## 2015-02-25 NOTE — Care Management (Signed)
Notified by Corene Cornea PT that patient will need SNF. Dr. Mody/Dr. Burt Knack updated. I have notified Baxter Flattery and Caryl Pina CSWs that patient wants [facility] and Maudie Mercury at [facility] has been notified/waiting on paper work. No further RNCM needs.

## 2015-02-25 NOTE — Progress Notes (Signed)
[name] [place] NAME: Deborah Obrien    MR#:  [id number]  DATE OF BIRTH:  [birth year]  SUBJECTIVE:  Patient is doing well this morning. She has tolerated her clear liquid diet. Physical therapy seen the patient in consultation and she will need to go to skilled nursing facility.   REVIEW OF SYSTEMS:    Review of Systems  Constitutional: Negative for fever, chills and malaise/fatigue.  HENT: Negative for sore throat.   Eyes: Negative for blurred vision.  Respiratory: Negative for cough, hemoptysis, sputum production, shortness of breath and wheezing.   Cardiovascular: Negative for chest pain, palpitations, orthopnea, claudication and leg swelling.  Gastrointestinal: Negative for nausea, vomiting, abdominal pain, diarrhea, constipation and blood in stool.  Genitourinary: Negative for dysuria.  Musculoskeletal: Negative for back pain.  Neurological: Positive for weakness. Negative for dizziness, tingling, tremors and headaches.  Endo/Heme/Allergies: Does not bruise/bleed easily.  Psychiatric/Behavioral: Negative for depression. The patient is not nervous/anxious.     Tolerating Diet: Nothing by mouth      DRUG ALLERGIES:   Allergies  Allergen Reactions  . Cephalosporins Other (See Comments)    Other Reaction: Intolerance  . Hydrocodone-Acetaminophen Hives  . Macrolides And Ketolides Other (See Comments)    Other Reaction: Intolerance  . Meperidine Nausea Only and Other (See Comments)    SOB  . Other Nausea Only, Rash and Other (See Comments)    Uncoded Allergy. Allergen: NON-STEROIDS, Other Reaction: Not Assessed bextra - hands and feet swell Shellfish - SOB Shellfish - SOB  . Sulfa Antibiotics     Other reaction(s): Other (See Comments) Other Reaction: Intolerance  . Aspirin Nausea Only and Other (See Comments)    stomach pain  . Celecoxib Other (See Comments)    GI bleed  . Cephalexin Diarrhea and Nausea  Only  . Erythromycin Nausea And Vomiting    GI Upset  . Hydromorphone Other (See Comments)    hypotension,easy overdose hypotension,easy overdose  . Iodinated Diagnostic Agents Rash    CKD  . Oxycodone Other (See Comments)    easy overdose  . Ambrosia Artemisiifolia (Ragweed) Skin Test     Other reaction(s): Other (See Comments) Other Reaction: Not Assessed  . Atorvastatin Nausea Only and Other (See Comments)    weakness  . Codeine Diarrhea and Nausea And Vomiting  . Prednisone     Other reaction(s): Other (See Comments) Blood sugar to go up.  . Valdecoxib     VITALS:  Blood pressure 135/51, pulse 54, temperature 97.9 F (36.6 C), temperature source Oral, resp. rate 16, height 5\' 4"  (1.626 m), weight 126.78 kg (279 lb 8 oz), SpO2 95 %.  PHYSICAL EXAMINATION:   Physical Exam  Constitutional: She is oriented to person, place, and time and well-developed, well-nourished, and in no distress. No distress.  HENT:  Head: Normocephalic.  Eyes: No scleral icterus.  Neck: Normal range of motion. Neck supple. No JVD present. No tracheal deviation present.  Cardiovascular: Normal rate, regular rhythm and normal heart sounds.  Exam reveals no gallop and no friction rub.   No murmur heard. Pulmonary/Chest: Effort normal and breath sounds normal. No respiratory distress. She has no wheezes. She has no rales. She exhibits no tenderness.  Abdominal: Soft. Bowel sounds are normal. She exhibits no distension and no mass. There is no tenderness. There is no rebound and no guarding.  Musculoskeletal: Normal range of motion. She exhibits no edema.  Neurological: She is alert and oriented  to person, place, and time. No cranial nerve deficit.  Skin: Skin is warm. No rash noted. No erythema.  Psychiatric: Affect and judgment normal.      LABORATORY PANEL:   CBC  Recent Labs Lab 02/24/15 0437  WBC 6.4  HGB 9.3*  HCT 31.8*  PLT 198    ------------------------------------------------------------------------------------------------------------------  Chemistries   Recent Labs Lab 02/24/15 0437 02/25/15 0440  NA 149* 145  K 4.8 4.2  CL 112* 111  CO2 28 28  GLUCOSE 118* 120*  BUN 27* 24*  CREATININE 1.47* 1.32*  CALCIUM 8.9 8.8*  AST 28  --   ALT 19  --   ALKPHOS 72  --   BILITOT 0.7  --    ------------------------------------------------------------------------------------------------------------------  Cardiac Enzymes  Recent Labs Lab 02/22/15 0853 02/22/15 1621 02/22/15 2138  TROPONINI 0.08* 0.12* 0.16*   ------------------------------------------------------------------------------------------------------------------  RADIOLOGY:  Ct Abdomen Pelvis Wo Contrast  02/20/2015    IMPRESSION: 1. Low-moderate grade small bowel obstruction, approximately mid jejunum, with transition point in the mid abdomen just to the left of midline. 2. No other acute findings are evident. 3. Prior hysterectomy, cholecystectomy, sigmoidectomy and appendectomy. Unremarkable colonic anastomosis in the low pelvic midline.   Electronically Signed   By: Andreas Newport M.D.   On: 02/20/2015 21:52   Dg Chest 1 View  02/22/2015   IMPRESSION: No active disease.   Electronically Signed   By: Inez Catalina M.D.   On: 02/22/2015 09:15   Dg Abd 2 Views  02/21/2015   IMPRESSION: 1. Mild small bowel dilatation in the left abdomen, similar to recent CT and compatible with relatively low-grade obstruction as described on that study. 2. Enteric tube in the gastric body.   Electronically Signed   By: Logan Bores   On: 02/21/2015 08:10   EKG: Normal sinus rhythm. No ST elevation or depression.  02/23/2015 abdominal x-ray: Improved appearance of bowel gas pattern. NG tube is in distal stomach.  ASSESSMENT AND PLAN:   This is a 74 year old female with a history of atrial fibrillation, chronic kidney disease stage III and diabetes who  was admitted to the surgical service due to SBO.    1. Shortness of breath and chest pain: Rapid response was called on June 12. I suspect that her symptoms are actually more related to anxiety. Her EKG showed no acute changes. Her troponin was slightly elevated. She was seen and evaluated with cardiology who did not feel that this is acute coronary syndrome. Patient should  continue with Ativan for her anxiety.   2. Chronic kidney disease stage III: Her creatinine is at her baseline of 1.8-2.1  3. Paroxysmal atrial fibrillation: Cardiology is following. Patient will continue on amiodarone and metoprolol. Anticoagulation was discontinued due to GI bleeding.  4. Diabetes: patient takes Levemir 42 units at night at home and she has a sliding scale insulin order sheet that she follows. She is very knowledgeable about her diabetes. She should continue on her outpatient medications if she is going to be discharged on her ADA diet. I have thoroughly reviewed her insulin regimen with the patient.    5. SBO This has improved. Patient is currently tolerating her diet. 6. Hyperkalemia: Resolved  Management plans discussed with the patient and Dr. Burt Knack  and they are in agreement.  CODE STATUS: FULL  TOTAL TIME TAKING CARE OF THIS PATIENT:  29 minutes.   POSSIBLE D/C IN 2-3 DAYS, DEPENDING ON CLINICAL CONDITION.  Greater than50 % time spent in care  and coordination and communication  From a medical standpoint patient is stable to go to skilled nursing facility today. Plan discussed with clinical social worker as well as Dr. Burt Knack and family.  Therron Sells M.D on 02/25/2015 at 10:43 AM  Between 7am to 6pm - Pager - [phone] After 6pm go to [email or web address] - password [email or web address] [place] [facility]  Office  [phone]  CC: Primary care physician; [name]., MD

## 2015-02-27 ENCOUNTER — Other Ambulatory Visit
Admission: RE | Admit: 2015-02-27 | Discharge: 2015-02-27 | Disposition: A | Discharge status: Home / Self Care | Payer: Medicare Other | Source: Skilled Nursing Facility | Attending: Internal Medicine | Admitting: Internal Medicine

## 2015-02-27 DIAGNOSIS — R32 Unspecified urinary incontinence: Secondary | ICD-10-CM | POA: Insufficient documentation

## 2015-02-27 DIAGNOSIS — R350 Frequency of micturition: Secondary | ICD-10-CM | POA: Diagnosis present

## 2015-02-27 LAB — URINALYSIS COMPLETE WITH MICROSCOPIC (ARMC ONLY)
BACTERIA UA: NONE SEEN
BILIRUBIN URINE: NEGATIVE
Glucose, UA: NEGATIVE mg/dL
Hgb urine dipstick: NEGATIVE
KETONES UR: NEGATIVE mg/dL
Leukocytes, UA: NEGATIVE
Nitrite: NEGATIVE
PROTEIN: NEGATIVE mg/dL
SPECIFIC GRAVITY, URINE: 1.008 (ref 1.005–1.030)
pH: 5 (ref 5.0–8.0)

## 2015-03-01 LAB — URINE CULTURE: Culture: NO GROWTH

## 2015-03-02 LAB — GLUCOSE, CAPILLARY: Glucose-Capillary: 149 mg/dL — ABNORMAL HIGH (ref 65–99)

## 2015-03-23 ENCOUNTER — Other Ambulatory Visit: Payer: Self-pay | Admitting: Gastroenterology

## 2015-03-23 DIAGNOSIS — D509 Iron deficiency anemia, unspecified: Secondary | ICD-10-CM

## 2015-03-23 DIAGNOSIS — R1013 Epigastric pain: Secondary | ICD-10-CM

## 2015-03-30 ENCOUNTER — Ambulatory Visit
Admission: RE | Admit: 2015-03-30 | Discharge: 2015-03-30 | Disposition: A | Discharge status: Home / Self Care | Payer: Medicare Other | Source: Ambulatory Visit | Attending: Gastroenterology | Admitting: Gastroenterology

## 2015-03-30 DIAGNOSIS — D509 Iron deficiency anemia, unspecified: Secondary | ICD-10-CM

## 2015-03-30 DIAGNOSIS — R1013 Epigastric pain: Secondary | ICD-10-CM

## 2015-03-30 DIAGNOSIS — D649 Anemia, unspecified: Secondary | ICD-10-CM | POA: Insufficient documentation

## 2015-03-31 ENCOUNTER — Ambulatory Visit: Payer: Medicare Other

## 2015-04-02 ENCOUNTER — Other Ambulatory Visit: Payer: Self-pay | Admitting: Internal Medicine

## 2015-04-02 DIAGNOSIS — Z1231 Encounter for screening mammogram for malignant neoplasm of breast: Secondary | ICD-10-CM

## 2015-04-06 ENCOUNTER — Other Ambulatory Visit: Payer: Self-pay | Admitting: Internal Medicine

## 2015-04-06 ENCOUNTER — Ambulatory Visit
Admission: RE | Admit: 2015-04-06 | Discharge: 2015-04-06 | Disposition: A | Discharge status: Home / Self Care | Payer: Medicare Other | Source: Ambulatory Visit | Attending: Internal Medicine | Admitting: Internal Medicine

## 2015-04-06 DIAGNOSIS — Z1231 Encounter for screening mammogram for malignant neoplasm of breast: Secondary | ICD-10-CM | POA: Diagnosis present

## 2015-04-06 DIAGNOSIS — R928 Other abnormal and inconclusive findings on diagnostic imaging of breast: Secondary | ICD-10-CM | POA: Insufficient documentation

## 2015-04-07 ENCOUNTER — Other Ambulatory Visit: Payer: Self-pay | Admitting: Internal Medicine

## 2015-04-07 DIAGNOSIS — R921 Mammographic calcification found on diagnostic imaging of breast: Secondary | ICD-10-CM

## 2015-04-08 ENCOUNTER — Ambulatory Visit
Admission: RE | Admit: 2015-04-08 | Discharge: 2015-04-08 | Disposition: A | Discharge status: Home / Self Care | Payer: Medicare Other | Source: Ambulatory Visit | Attending: Internal Medicine | Admitting: Internal Medicine

## 2015-04-08 ENCOUNTER — Ambulatory Visit: Payer: Medicare Other

## 2015-04-08 ENCOUNTER — Other Ambulatory Visit: Payer: Self-pay | Admitting: Internal Medicine

## 2015-04-08 DIAGNOSIS — R921 Mammographic calcification found on diagnostic imaging of breast: Secondary | ICD-10-CM

## 2015-04-09 ENCOUNTER — Other Ambulatory Visit: Payer: Self-pay | Admitting: Internal Medicine

## 2015-04-09 DIAGNOSIS — R921 Mammographic calcification found on diagnostic imaging of breast: Secondary | ICD-10-CM

## 2015-04-13 ENCOUNTER — Ambulatory Visit
Admission: RE | Admit: 2015-04-13 | Discharge: 2015-04-13 | Disposition: A | Discharge status: Home / Self Care | Payer: Medicare Other | Source: Ambulatory Visit | Attending: Internal Medicine | Admitting: Internal Medicine

## 2015-04-13 DIAGNOSIS — R921 Mammographic calcification found on diagnostic imaging of breast: Secondary | ICD-10-CM

## 2015-04-13 DIAGNOSIS — R920 Mammographic microcalcification found on diagnostic imaging of breast: Secondary | ICD-10-CM | POA: Diagnosis not present

## 2015-04-13 HISTORY — PX: BREAST BIOPSY: SHX20

## 2015-04-14 LAB — SURGICAL PATHOLOGY

## 2015-05-04 ENCOUNTER — Encounter: Payer: Medicare Other | Admitting: Pharmacist

## 2015-05-04 ENCOUNTER — Ambulatory Visit: Payer: Medicare Other

## 2015-05-04 ENCOUNTER — Encounter (INDEPENDENT_AMBULATORY_CARE_PROVIDER_SITE_OTHER): Payer: Self-pay

## 2015-05-05 ENCOUNTER — Encounter (INDEPENDENT_AMBULATORY_CARE_PROVIDER_SITE_OTHER): Payer: Self-pay

## 2015-06-14 ENCOUNTER — Emergency Department: Payer: Medicare Other

## 2015-06-14 ENCOUNTER — Encounter: Payer: Self-pay | Admitting: Emergency Medicine

## 2015-06-14 ENCOUNTER — Emergency Department
Admission: EM | Admit: 2015-06-14 | Discharge: 2015-06-14 | Disposition: A | Discharge status: Home / Self Care | Payer: Medicare Other | Attending: Emergency Medicine | Admitting: Emergency Medicine

## 2015-06-14 DIAGNOSIS — Z794 Long term (current) use of insulin: Secondary | ICD-10-CM | POA: Diagnosis not present

## 2015-06-14 DIAGNOSIS — R51 Headache: Secondary | ICD-10-CM | POA: Diagnosis not present

## 2015-06-14 DIAGNOSIS — N183 Chronic kidney disease, stage 3 (moderate): Secondary | ICD-10-CM | POA: Diagnosis not present

## 2015-06-14 DIAGNOSIS — R609 Edema, unspecified: Secondary | ICD-10-CM | POA: Diagnosis not present

## 2015-06-14 DIAGNOSIS — E119 Type 2 diabetes mellitus without complications: Secondary | ICD-10-CM | POA: Insufficient documentation

## 2015-06-14 DIAGNOSIS — I5033 Acute on chronic diastolic (congestive) heart failure: Secondary | ICD-10-CM | POA: Diagnosis not present

## 2015-06-14 DIAGNOSIS — Z79899 Other long term (current) drug therapy: Secondary | ICD-10-CM | POA: Diagnosis not present

## 2015-06-14 DIAGNOSIS — R001 Bradycardia, unspecified: Secondary | ICD-10-CM | POA: Insufficient documentation

## 2015-06-14 DIAGNOSIS — Z7982 Long term (current) use of aspirin: Secondary | ICD-10-CM | POA: Diagnosis not present

## 2015-06-14 DIAGNOSIS — R42 Dizziness and giddiness: Secondary | ICD-10-CM | POA: Diagnosis present

## 2015-06-14 DIAGNOSIS — I129 Hypertensive chronic kidney disease with stage 1 through stage 4 chronic kidney disease, or unspecified chronic kidney disease: Secondary | ICD-10-CM | POA: Insufficient documentation

## 2015-06-14 LAB — COMPREHENSIVE METABOLIC PANEL
ALBUMIN: 3.9 g/dL (ref 3.5–5.0)
ALK PHOS: 100 U/L (ref 38–126)
ALT: 23 U/L (ref 14–54)
AST: 34 U/L (ref 15–41)
Anion gap: 10 (ref 5–15)
BILIRUBIN TOTAL: 1.4 mg/dL — AB (ref 0.3–1.2)
BUN: 34 mg/dL — AB (ref 6–20)
CALCIUM: 9.1 mg/dL (ref 8.9–10.3)
CO2: 27 mmol/L (ref 22–32)
CREATININE: 1.9 mg/dL — AB (ref 0.44–1.00)
Chloride: 105 mmol/L (ref 101–111)
GFR calc Af Amer: 29 mL/min — ABNORMAL LOW (ref >60)
GFR, EST NON AFRICAN AMERICAN: 25 mL/min — AB (ref >60)
GLUCOSE: 59 mg/dL — AB (ref 65–99)
Potassium: 4.2 mmol/L (ref 3.5–5.1)
Sodium: 142 mmol/L (ref 135–145)
Total Protein: 7 g/dL (ref 6.5–8.1)

## 2015-06-14 LAB — CBC WITH DIFFERENTIAL/PLATELET
BASOS ABS: 0.1 10*3/uL (ref 0–0.1)
BASOS PCT: 1 %
EOS ABS: 0.1 10*3/uL (ref 0–0.7)
EOS PCT: 3 %
HCT: 31 % — ABNORMAL LOW (ref 35.0–47.0)
Hemoglobin: 9.7 g/dL — ABNORMAL LOW (ref 12.0–16.0)
LYMPHS PCT: 34 %
Lymphs Abs: 1.8 10*3/uL (ref 1.0–3.6)
MCH: 25.9 pg — ABNORMAL LOW (ref 26.0–34.0)
MCHC: 31.2 g/dL — ABNORMAL LOW (ref 32.0–36.0)
MCV: 82.9 fL (ref 80.0–100.0)
MONO ABS: 0.7 10*3/uL (ref 0.2–0.9)
Monocytes Relative: 13 %
Neutro Abs: 2.6 10*3/uL (ref 1.4–6.5)
Neutrophils Relative %: 49 %
PLATELETS: 199 10*3/uL (ref 150–440)
RBC: 3.74 MIL/uL — ABNORMAL LOW (ref 3.80–5.20)
RDW: 20.4 % — AB (ref 11.5–14.5)
WBC: 5.3 10*3/uL (ref 3.6–11.0)

## 2015-06-14 LAB — URINALYSIS COMPLETE WITH MICROSCOPIC (ARMC ONLY)
BACTERIA UA: NONE SEEN
BILIRUBIN URINE: NEGATIVE
GLUCOSE, UA: NEGATIVE mg/dL
Hgb urine dipstick: NEGATIVE
KETONES UR: NEGATIVE mg/dL
Leukocytes, UA: NEGATIVE
NITRITE: NEGATIVE
Protein, ur: NEGATIVE mg/dL
Specific Gravity, Urine: 1.005 (ref 1.005–1.030)
pH: 5 (ref 5.0–8.0)

## 2015-06-14 LAB — GLUCOSE, CAPILLARY
GLUCOSE-CAPILLARY: 100 mg/dL — AB (ref 65–99)
Glucose-Capillary: 64 mg/dL — ABNORMAL LOW (ref 65–99)

## 2015-06-14 LAB — BRAIN NATRIURETIC PEPTIDE: B NATRIURETIC PEPTIDE 5: 648 pg/mL — AB (ref 0.0–100.0)

## 2015-06-14 LAB — TROPONIN I

## 2015-06-14 NOTE — ED Provider Notes (Addendum)
[facility] Emergency Department Provider Note   ____________________________________________  Time seen:  I have reviewed the triage vital signs and the triage nursing note.  HISTORY  Chief Complaint Dizziness   Historian Patient  HPI Deborah Obrien is a 74 y.o. female who is a history of morbid obesity, hypertension, diabetes, chronic kidney disease, paroxysmal atrial fibrillation, congestive heart failure who is presenting for weakness and dizziness upon standing as well as increased lower extremity edema for about 1 week as well as heart rate in the 40s. Patient states her heart rate is normally in the 50s to 60s. She does follow with Dr. Nehemiah Massed for cardiology. She follows with Dr. Ouida Sills and was started this morning on increased dose of Lasix for a complaint of shortness of breath with exertion and increased lower extremity edema. When her heart rate was noted to be in the 40s along with the symptoms, she was sent to the emergency department from [facility] clinic urgent care for further evaluation.  Patient reports no change in her metoprolol, or amiodarone recently, however she took a double dose of Lasix to 40 mg this morning.    Past Medical History  Diagnosis Date  . Diabetes mellitus without complication ([place])   . Hypertension   . Morbid obesity ([place])   . Chronic kidney disease (CKD) stage G3a/A1, moderately decreased glomerular filtration rate (GFR) between 45-59 mL/min/1.73 square meter and albuminuria creatinine ratio less than 30 mg/g   . Paroxysmal atrial fibrillation [facility])     Patient Active Problem List   Diagnosis Date Noted  . Intestinal obstruction ([place]) 02/20/2015    Past Surgical History  Procedure Laterality Date  . Shoulder arthroscopy with subacromial decompression Left   . Esophagogastroduodenoscopy N/A 01/27/2015    Procedure: ESOPHAGOGASTRODUODENOSCOPY (EGD);  Surgeon: Lollie Sails, MD;  Location: [facility] ENDOSCOPY;   Service: Endoscopy;  Laterality: N/A;  . Colon surgery      Current Outpatient Rx  Name  Route  Sig  Dispense  Refill  . amiodarone (PACERONE) 200 MG tablet   Oral   Take 200 mg by mouth daily.         Marland Kitchen aspirin 81 MG tablet   Oral   Take 81 mg by mouth daily.         . Calcium Carb-Cholecalciferol (CALCIUM 600 + D PO)   Oral   Take 1 tablet by mouth 2 (two) times daily.         . Cranberry-Vitamin C-Vitamin E 4200-20-3 MG-MG-UNIT CAPS   Oral   Take 2 tablets by mouth 2 (two) times daily.         Marland Kitchen docusate sodium (COLACE) 100 MG capsule   Oral   Take 200 mg by mouth at bedtime.         . furosemide (LASIX) 40 MG tablet   Oral   Take 20 mg by mouth daily.         . hydrocortisone 2.5 % cream   Topical   Apply 1 application topically at bedtime. Apply to rash under breast         . insulin aspart (NOVOLOG) 100 UNIT/ML injection   Subcutaneous   Inject 15-35 Units into the skin 3 (three) times daily with meals. Blood glucose 0-150= 15 units 151-200= 17 units 201-250= 19 units 251-300= 23 units 301-350= 27 units 351-400= 31 units 401-up= 35 units         . insulin detemir (LEVEMIR) 100 UNIT/ML injection   Subcutaneous  Inject 40 Units into the skin at bedtime.         Marland Kitchen ketoconazole (NIZORAL) 2 % cream   Topical   Apply 1 application topically every 2 (two) hours. Apply to rash under breast         . Multiple Vitamin (MULTI-VITAMINS) TABS   Oral   Take 1 tablet by mouth daily.         Marland Kitchen oxybutynin (DITROPAN-XL) 10 MG 24 hr tablet   Oral   Take 10 mg by mouth at bedtime.         . pantoprazole (PROTONIX) 40 MG tablet   Oral   Take 40 mg by mouth 2 (two) times daily.         Marland Kitchen PARoxetine (PAXIL) 20 MG tablet   Oral   Take 20 mg by mouth daily.         . pravastatin (PRAVACHOL) 20 MG tablet   Oral   Take 1 tablet by mouth every evening.         . sucralfate (CARAFATE) 1 G tablet   Oral   Take 1 tablet by mouth 4 (four)  times daily -  before meals and at bedtime.         Marland Kitchen acetaminophen (TYLENOL) 500 MG tablet   Oral   Take 1,000 mg by mouth every 6 (six) hours as needed for moderate pain.         Marland Kitchen insulin glargine (LANTUS) 100 UNIT/ML injection   Subcutaneous   Inject 38 Units into the skin at bedtime.          . metoprolol tartrate (LOPRESSOR) 25 MG tablet   Oral   Take 0.5 tablets by mouth 2 (two) times daily.         . potassium chloride (K-DUR) 10 MEQ tablet   Oral   Take 1 tablet by mouth every morning.         . vitamin D, CHOLECALCIFEROL, 400 UNITS tablet   Oral   Take 1-2 tablets by mouth 2 (two) times daily. 2 tabs in the morning and 1 at night           Allergies Cephalosporins; Hydrocodone-acetaminophen; Macrolides and ketolides; Meperidine; Other; Sulfa antibiotics; Aspirin; Celecoxib; Cephalexin; Erythromycin; Hydromorphone; Iodinated diagnostic agents; Oxycodone; Ambrosia artemisiifolia (ragweed) skin test; Atorvastatin; Codeine; Prednisone; and Valdecoxib  No family history on file.  Social History Social History  Substance Use Topics  . Smoking status: Never Smoker   . Smokeless tobacco: None  . Alcohol Use: No    Review of Systems  Constitutional: Negative for fever. Eyes: Negative for visual changes. ENT: Negative for sore throat. Cardiovascular: Negative for chest pain. Respiratory: Positive for shortness of breath with exertion. Gastrointestinal: Negative for abdominal pain, vomiting and diarrhea. Genitourinary: Negative for dysuria. Musculoskeletal: Negative for back pain. Skin: Negative for rash. Neurological: Positive for intermittent global headache. 10 point Review of Systems otherwise negative ____________________________________________   PHYSICAL EXAM:  VITAL SIGNS: ED Triage Vitals  Enc Vitals Group     BP 06/14/15 1356 145/54 mmHg     Pulse Rate 06/14/15 1356 43     Resp 06/14/15 1356 18     Temp 06/14/15 1356 98.3 F (36.8 C)      Temp Source 06/14/15 1356 Oral     SpO2 06/14/15 1356 97 %     Weight 06/14/15 1356 279 lb (126.554 kg)     Height 06/14/15 1356 5\' 4"  (1.626 m)     Head  Cir --      Peak Flow --      Pain Score 06/14/15 1357 4     Pain Loc --      Pain Edu? --      Excl. in [place]? --      Constitutional: Alert and oriented. Well appearing and in no distress. Eyes: Conjunctivae are normal. PERRL. Normal extraocular movements. ENT   Head: Normocephalic and atraumatic.   Nose: No congestion/rhinnorhea.   Mouth/Throat: Mucous membranes are moist.   Neck: No stridor. Cardiovascular/Chest: Bradycardic and irregularly irregular.  No murmurs, rubs, or gallops. Respiratory: Normal respiratory effort without tachypnea nor retractions. Breath sounds are clear and equal bilaterally. No wheezes/rales/rhonchi. Gastrointestinal: Soft. No distention, no guarding, no rebound. Nontender . Obese Genitourinary/rectal:Deferred Musculoskeletal: Nontender with normal range of motion in all extremities. No joint effusions.  No lower extremity tenderness. 2+ lower extremity pitting edema. Neurologic:  Normal speech and language. No gross or focal neurologic deficits are appreciated. Skin:  Skin is warm, dry and intact. No rash noted. Psychiatric: Mood and affect are normal. Speech and behavior are normal. Patient exhibits appropriate insight and judgment.  ____________________________________________   EKG I, Lisa Roca, MD, the attending physician have personally viewed and interpreted all ECGs.  50 bpm. Marked sinus bradycardia. Narrow QRS. Normal axis. Nonspecific T wave. ____________________________________________  LABS (pertinent positives/negatives)  Urinalysis negative Troponin less than 0.03 Comprehensive metabolic panel within normal limits except glucose of 59 8, BUN 34 and creatinine 1.9 White blood count 5.3 and hemoglobin 9.7, platelet count 199 BNP  648  ____________________________________________  RADIOLOGY All Xrays were viewed by me. Imaging interpreted by Radiologist.  Chest x-ray two-view:   IMPRESSION: No acute cardiopulmonary disease.  Cardiomegaly without congestive failure. __________________________________________  PROCEDURES  Procedure(s) performed: None  Critical Care performed: None  ____________________________________________   ED COURSE / ASSESSMENT AND PLAN  CONSULTATIONS: None  Pertinent labs & imaging results that were available during my care of the patient were reviewed by me and considered in my medical decision making (see chart for details).  Patient is overall well-appearing and sent here from [facility] clinic for evaluation of shortness of breath, bradycardia, generalized weakness, and dizziness upon standing. Patient does not have any focal neurologic deficits, I do not suspect an intracranial emergency.  Patient states she feels like she is having symptoms of congestive heart failure, and was told to increase her Lasix from 20 mg to 40 mg just this morning. She is not having any hypoxia, and her chest x-ray is reassuring for no source for shortness of breath. She is not short of breath at rest. She does have lower extremity edema and I do suspect her symptoms are due to mild congestive heart failure exacerbation. I'm not suspicious of acute coronary syndrome. I do not suspect pulmonary embolism.  In terms of her bradycardia, patient has had sinus bradycardia in the 50s historically, and today she is as low as 43 bpm sinus bradycardia. Her EKG has some underlying wavy baseline, however her rhythm strip on the monitor is very clearly sinus bradycardia, with no extra beats or atrial fibrillation.  She is not symptomatic at rest. She is able to stand and walk. I think she is stable for discharge home to follow-up in the next 2 days with her cardiologist and/or primary care physician. She is going  to hold her metoprolol dose tonight and have asked her to check her pulse in the morning and hold her dose if her heart rate  is less than 60 bpm.  Patient's blood glucose was found to be low on blood work, and rechecked her fingerstick glucose it was still low, and she was given food. She is a diabetic who hadn't had food for several hours waiting for evaluation treatment emergency department.  Patient / Family / Caregiver informed of clinical course, medical decision-making process, and agree with plan.   I discussed return precautions, follow-up instructions, and discharged instructions with patient and/or family.  ___________________________________________   FINAL CLINICAL IMPRESSION(S) / ED DIAGNOSES   Final diagnoses:  Acute on chronic diastolic congestive heart failure (HCC)  Bradycardia       Lisa Roca, MD 06/14/15 1801  Lisa Roca, MD 06/14/15 [phone]

## 2015-06-14 NOTE — ED Notes (Signed)
Brought over from [facility] with dizziness and low heart rate   Ankle swelling

## 2015-06-14 NOTE — Discharge Instructions (Signed)
You were evaluated for lower extremity swelling and shortness of breath along with generalized weakness as well as low heart rate. Your exam and evaluation are reassuring here in the emergency department.  You were found to have a mild congestive heart failure exacerbation. Continue with the higher dose of Lasix as discussed with the primary care physician. Your heart rate was between 42 and 58 here in the emergency department. Do not take tonight's dose of metoprolol. Do not take metoprolol in the morning if your heart rate is 60 bpm or less. You need to follow up with your primary care physician or cardiologist within 2 days.  Return to the emergency department for any worsening condition including trouble breathing, shortness breath, chest pain, dizziness, passing out, or any other symptoms concerning to you.   Bradycardia Bradycardia is a term for a heart rate (pulse) that, in adults, is slower than 60 beats per minute. A normal rate is 60 to 100 beats per minute. A heart rate below 60 beats per minute may be normal for some adults with healthy hearts. If the rate is too slow, the heart may have trouble pumping the volume of blood the body needs. If the heart rate gets too low, blood flow to the brain may be decreased and may make you feel lightheaded, dizzy, or faint. The heart has a natural pacemaker in the top of the heart called the SA node (sinoatrial or sinus node). This pacemaker sends out regular electrical signals to the muscle of the heart, telling the heart muscle when to beat (contract). The electrical signal travels from the upper parts of the heart (atria) through the AV node (atrioventricular node), to the lower chambers of the heart (ventricles). The ventricles squeeze, pumping the blood from your heart to your lungs and to the rest of your body. CAUSES   Problem with the heart's electrical system.  Problem with the heart's natural pacemaker.  Heart disease, damage, or  infection.  Medications.  Problems with minerals and salts (electrolytes). SYMPTOMS   Fainting (syncope).  Fatigue and weakness.  Shortness of breath (dyspnea).  Chest pain (angina).  Drowsiness.  Confusion. DIAGNOSIS   An electrocardiogram (ECG) can help your caregiver determine the type of slow heart rate you have.  If the cause is not seen on an ECG, you may need to wear a heart monitor that records your heart rhythm for several hours or days.  Blood tests. TREATMENT   Electrolyte supplements.  Medications.  Withholding medication which is causing a slow heart rate.  Pacemaker placement. SEEK IMMEDIATE MEDICAL CARE IF:   You feel lightheaded or faint.  You develop an irregular heart rate.  You feel chest pain or have trouble breathing. MAKE SURE YOU:   Understand these instructions.  Will watch your condition.  Will get help right away if you are not doing well or get worse. Document Released: 05/21/2002 Document Revised: 11/21/2011 Document Reviewed: 12/04/2013 [facility] Patient Information 2015 [place], [place]. This information is not intended to replace advice given to you by your health care provider. Make sure you discuss any questions you have with your health care provider.  Heart Failure Heart failure is a condition in which the heart has trouble pumping blood. This means your heart does not pump blood efficiently for your body to work well. In some cases of heart failure, fluid may back up into your lungs or you may have swelling (edema) in your lower legs. Heart failure is usually a long-term (chronic) condition.  It is important for you to take good care of yourself and follow your health care provider's treatment plan. CAUSES  Some health conditions can cause heart failure. Those health conditions include:  High blood pressure (hypertension). Hypertension causes the heart muscle to work harder than normal. When pressure in the blood vessels is  high, the heart needs to pump (contract) with more force in order to circulate blood throughout the body. High blood pressure eventually causes the heart to become stiff and weak.  Coronary artery disease (CAD). CAD is the buildup of cholesterol and fat (plaque) in the arteries of the heart. The blockage in the arteries deprives the heart muscle of oxygen and blood. This can cause chest pain and may lead to a heart attack. High blood pressure can also contribute to CAD.  Heart attack (myocardial infarction). A heart attack occurs when one or more arteries in the heart become blocked. The loss of oxygen damages the muscle tissue of the heart. When this happens, part of the heart muscle dies. The injured tissue does not contract as well and weakens the heart's ability to pump blood.  Abnormal heart valves. When the heart valves do not open and close properly, it can cause heart failure. This makes the heart muscle pump harder to keep the blood flowing.  Heart muscle disease (cardiomyopathy or myocarditis). Heart muscle disease is damage to the heart muscle from a variety of causes. These can include drug or alcohol abuse, infections, or unknown reasons. These can increase the risk of heart failure.  Lung disease. Lung disease makes the heart work harder because the lungs do not work properly. This can cause a strain on the heart, leading it to fail.  Diabetes. Diabetes increases the risk of heart failure. High blood sugar contributes to high fat (lipid) levels in the blood. Diabetes can also cause slow damage to tiny blood vessels that carry important nutrients to the heart muscle. When the heart does not get enough oxygen and food, it can cause the heart to become weak and stiff. This leads to a heart that does not contract efficiently.  Other conditions can contribute to heart failure. These include abnormal heart rhythms, thyroid problems, and low blood counts (anemia). Certain unhealthy behaviors  can increase the risk of heart failure, including:  Being overweight.  Smoking or chewing tobacco.  Eating foods high in fat and cholesterol.  Abusing illicit drugs or alcohol.  Lacking physical activity. SYMPTOMS  Heart failure symptoms may vary and can be hard to detect. Symptoms may include:  Shortness of breath with activity, such as climbing stairs.  Persistent cough.  Swelling of the feet, ankles, legs, or abdomen.  Unexplained weight gain.  Difficulty breathing when lying flat (orthopnea).  Waking from sleep because of the need to sit up and get more air.  Rapid heartbeat.  Fatigue and loss of energy.  Feeling light-headed, dizzy, or close to fainting.  Loss of appetite.  Nausea.  Increased urination during the night (nocturia). DIAGNOSIS  A diagnosis of heart failure is based on your history, symptoms, physical examination, and diagnostic tests. Diagnostic tests for heart failure may include:  Echocardiography.  Electrocardiography.  Chest X-ray.  Blood tests.  Exercise stress test.  Cardiac angiography.  Radionuclide scans. TREATMENT  Treatment is aimed at managing the symptoms of heart failure. Medicines, behavioral changes, or surgical intervention may be necessary to treat heart failure.  Medicines to help treat heart failure may include:  Angiotensin-converting enzyme (ACE) inhibitors. This type  of medicine blocks the effects of a blood protein called angiotensin-converting enzyme. ACE inhibitors relax (dilate) the blood vessels and help lower blood pressure.  Angiotensin receptor blockers (ARBs). This type of medicine blocks the actions of a blood protein called angiotensin. Angiotensin receptor blockers dilate the blood vessels and help lower blood pressure.  Water pills (diuretics). Diuretics cause the kidneys to remove salt and water from the blood. The extra fluid is removed through urination. This loss of extra fluid lowers the volume  of blood the heart pumps.  Beta blockers. These prevent the heart from beating too fast and improve heart muscle strength.  Digitalis. This increases the force of the heartbeat.  Healthy behavior changes include:  Obtaining and maintaining a healthy weight.  Stopping smoking or chewing tobacco.  Eating heart-healthy foods.  Limiting or avoiding alcohol.  Stopping illicit drug use.  Physical activity as directed by your health care provider.  Surgical treatment for heart failure may include:  A procedure to open blocked arteries, repair damaged heart valves, or remove damaged heart muscle tissue.  A pacemaker to improve heart muscle function and control certain abnormal heart rhythms.  An internal cardioverter defibrillator to treat certain serious abnormal heart rhythms.  A left ventricular assist device (LVAD) to assist the pumping ability of the heart. HOME CARE INSTRUCTIONS   Take medicines only as directed by your health care provider. Medicines are important in reducing the workload of your heart, slowing the progression of heart failure, and improving your symptoms.  Do not stop taking your medicine unless directed by your health care provider.  Do not skip any dose of medicine.  Refill your prescriptions before you run out of medicine. Your medicines are needed every day.  Engage in moderate physical activity if directed by your health care provider. Moderate physical activity can benefit some people. The elderly and people with severe heart failure should consult with a health care provider for physical activity recommendations.  Eat heart-healthy foods. Food choices should be free of trans fat and low in saturated fat, cholesterol, and salt (sodium). Healthy choices include fresh or frozen fruits and vegetables, fish, lean meats, legumes, fat-free or low-fat dairy products, and whole grain or high fiber foods. Talk to a dietitian to learn more about heart-healthy  foods.  Limit sodium if directed by your health care provider. Sodium restriction may reduce symptoms of heart failure in some people. Talk to a dietitian to learn more about heart-healthy seasonings.  Use healthy cooking methods. Healthy cooking methods include roasting, grilling, broiling, baking, poaching, steaming, or stir-frying. Talk to a dietitian to learn more about healthy cooking methods.  Limit fluids if directed by your health care provider. Fluid restriction may reduce symptoms of heart failure in some people.  Weigh yourself every day. Daily weights are important in the early recognition of excess fluid. You should weigh yourself every morning after you urinate and before you eat breakfast. Wear the same amount of clothing each time you weigh yourself. Record your daily weight. Provide your health care provider with your weight record.  Monitor and record your blood pressure if directed by your health care provider.  Check your pulse if directed by your health care provider.  Lose weight if directed by your health care provider. Weight loss may reduce symptoms of heart failure in some people.  Stop smoking or chewing tobacco. Nicotine makes your heart work harder by causing your blood vessels to constrict. Do not use nicotine gum or patches before  talking to your health care provider.  Keep all follow-up visits as directed by your health care provider. This is important.  Limit alcohol intake to no more than 1 drink per day for nonpregnant women and 2 drinks per day for men. One drink equals 12 ounces of beer, 5 ounces of wine, or 1 ounces of hard liquor. Drinking more than that is harmful to your heart. Tell your health care provider if you drink alcohol several times a week. Talk with your health care provider about whether alcohol is safe for you. If your heart has already been damaged by alcohol or you have severe heart failure, drinking alcohol should be stopped  completely.  Stop illicit drug use.  Stay up-to-date with immunizations. It is especially important to prevent respiratory infections through current pneumococcal and influenza immunizations.  Manage other health conditions such as hypertension, diabetes, thyroid disease, or abnormal heart rhythms as directed by your health care provider.  Learn to manage stress.  Plan rest periods when fatigued.  Learn strategies to manage high temperatures. If the weather is extremely hot:  Avoid vigorous physical activity.  Use air conditioning or fans or seek a cooler location.  Avoid caffeine and alcohol.  Wear loose-fitting, lightweight, and light-colored clothing.  Learn strategies to manage cold temperatures. If the weather is extremely cold:  Avoid vigorous physical activity.  Layer clothes.  Wear mittens or gloves, a hat, and a scarf when going outside.  Avoid alcohol.  Obtain ongoing education and support as needed.  Participate in or seek rehabilitation as needed to maintain or improve independence and quality of life. SEEK MEDICAL CARE IF:   Your weight increases by 03 lb/1.4 kg in 1 day or 05 lb/2.3 kg in a week.  You have increasing shortness of breath that is unusual for you.  You are unable to participate in your usual physical activities.  You tire easily.  You cough more than normal, especially with physical activity.  You have any or more swelling in areas such as your hands, feet, ankles, or abdomen.  You are unable to sleep because it is hard to breathe.  You feel like your heart is beating fast (palpitations).  You become dizzy or light-headed upon standing up. SEEK IMMEDIATE MEDICAL CARE IF:   You have difficulty breathing.  There is a change in mental status such as decreased alertness or difficulty with concentration.  You have a pain or discomfort in your chest.  You have an episode of fainting (syncope). MAKE SURE YOU:   Understand these  instructions.  Will watch your condition.  Will get help right away if you are not doing well or get worse. Document Released: 08/29/2005 Document Revised: 01/13/2014 Document Reviewed: 09/28/2012 [facility] Patient Information 2015 [place], [place]. This information is not intended to replace advice given to you by your health care provider. Make sure you discuss any questions you have with your health care provider.

## 2015-06-29 ENCOUNTER — Emergency Department: Payer: Medicare Other

## 2015-06-29 ENCOUNTER — Inpatient Hospital Stay
Admission: EM | Admit: 2015-06-29 | Discharge: 2015-07-03 | DRG: 243 | Disposition: A | Discharge status: Home / Self Care | Payer: Medicare Other | Attending: Internal Medicine | Admitting: Internal Medicine

## 2015-06-29 DIAGNOSIS — E785 Hyperlipidemia, unspecified: Secondary | ICD-10-CM | POA: Diagnosis present

## 2015-06-29 DIAGNOSIS — Z794 Long term (current) use of insulin: Secondary | ICD-10-CM

## 2015-06-29 DIAGNOSIS — I4821 Permanent atrial fibrillation: Secondary | ICD-10-CM | POA: Diagnosis present

## 2015-06-29 DIAGNOSIS — Z91013 Allergy to seafood: Secondary | ICD-10-CM | POA: Diagnosis not present

## 2015-06-29 DIAGNOSIS — Z882 Allergy status to sulfonamides status: Secondary | ICD-10-CM

## 2015-06-29 DIAGNOSIS — Z886 Allergy status to analgesic agent status: Secondary | ICD-10-CM

## 2015-06-29 DIAGNOSIS — K219 Gastro-esophageal reflux disease without esophagitis: Secondary | ICD-10-CM | POA: Diagnosis present

## 2015-06-29 DIAGNOSIS — N183 Chronic kidney disease, stage 3 (moderate): Secondary | ICD-10-CM | POA: Diagnosis present

## 2015-06-29 DIAGNOSIS — Z6841 Body Mass Index (BMI) 40.0 and over, adult: Secondary | ICD-10-CM | POA: Diagnosis not present

## 2015-06-29 DIAGNOSIS — I495 Sick sinus syndrome: Secondary | ICD-10-CM | POA: Diagnosis present

## 2015-06-29 DIAGNOSIS — Z888 Allergy status to other drugs, medicaments and biological substances status: Secondary | ICD-10-CM

## 2015-06-29 DIAGNOSIS — I480 Paroxysmal atrial fibrillation: Secondary | ICD-10-CM | POA: Diagnosis present

## 2015-06-29 DIAGNOSIS — R000 Tachycardia, unspecified: Secondary | ICD-10-CM | POA: Diagnosis present

## 2015-06-29 DIAGNOSIS — I129 Hypertensive chronic kidney disease with stage 1 through stage 4 chronic kidney disease, or unspecified chronic kidney disease: Secondary | ICD-10-CM | POA: Diagnosis present

## 2015-06-29 DIAGNOSIS — Z950 Presence of cardiac pacemaker: Secondary | ICD-10-CM

## 2015-06-29 DIAGNOSIS — Z79899 Other long term (current) drug therapy: Secondary | ICD-10-CM

## 2015-06-29 DIAGNOSIS — I517 Cardiomegaly: Secondary | ICD-10-CM | POA: Diagnosis present

## 2015-06-29 DIAGNOSIS — I959 Hypotension, unspecified: Secondary | ICD-10-CM | POA: Diagnosis not present

## 2015-06-29 DIAGNOSIS — I482 Chronic atrial fibrillation: Secondary | ICD-10-CM | POA: Diagnosis present

## 2015-06-29 DIAGNOSIS — E1122 Type 2 diabetes mellitus with diabetic chronic kidney disease: Secondary | ICD-10-CM | POA: Diagnosis present

## 2015-06-29 DIAGNOSIS — Z885 Allergy status to narcotic agent status: Secondary | ICD-10-CM | POA: Diagnosis not present

## 2015-06-29 DIAGNOSIS — Z7982 Long term (current) use of aspirin: Secondary | ICD-10-CM

## 2015-06-29 LAB — CBC WITH DIFFERENTIAL/PLATELET
BASOS PCT: 1 %
Basophils Absolute: 0 10*3/uL (ref 0–0.1)
EOS ABS: 0 10*3/uL (ref 0–0.7)
Eosinophils Relative: 1 %
HEMATOCRIT: 38 % (ref 35.0–47.0)
HEMOGLOBIN: 11.9 g/dL — AB (ref 12.0–16.0)
LYMPHS ABS: 1.6 10*3/uL (ref 1.0–3.6)
Lymphocytes Relative: 26 %
MCH: 26.4 pg (ref 26.0–34.0)
MCHC: 31.4 g/dL — ABNORMAL LOW (ref 32.0–36.0)
MCV: 84.1 fL (ref 80.0–100.0)
Monocytes Absolute: 0.6 10*3/uL (ref 0.2–0.9)
Monocytes Relative: 10 %
NEUTROS ABS: 3.8 10*3/uL (ref 1.4–6.5)
NEUTROS PCT: 62 %
Platelets: 248 10*3/uL (ref 150–440)
RBC: 4.51 MIL/uL (ref 3.80–5.20)
RDW: 20.2 % — ABNORMAL HIGH (ref 11.5–14.5)
WBC: 6 10*3/uL (ref 3.6–11.0)

## 2015-06-29 LAB — COMPREHENSIVE METABOLIC PANEL
ALT: 20 U/L (ref 14–54)
ANION GAP: 8 (ref 5–15)
AST: 34 U/L (ref 15–41)
Albumin: 4.1 g/dL (ref 3.5–5.0)
Alkaline Phosphatase: 98 U/L (ref 38–126)
BUN: 33 mg/dL — ABNORMAL HIGH (ref 6–20)
CHLORIDE: 104 mmol/L (ref 101–111)
CO2: 24 mmol/L (ref 22–32)
CREATININE: 1.67 mg/dL — AB (ref 0.44–1.00)
Calcium: 9 mg/dL (ref 8.9–10.3)
GFR, EST AFRICAN AMERICAN: 34 mL/min — AB (ref >60)
GFR, EST NON AFRICAN AMERICAN: 29 mL/min — AB (ref >60)
Glucose, Bld: 230 mg/dL — ABNORMAL HIGH (ref 65–99)
Potassium: 4 mmol/L (ref 3.5–5.1)
Sodium: 136 mmol/L (ref 135–145)
Total Bilirubin: 1.2 mg/dL (ref 0.3–1.2)
Total Protein: 7.7 g/dL (ref 6.5–8.1)

## 2015-06-29 LAB — GLUCOSE, CAPILLARY
GLUCOSE-CAPILLARY: 234 mg/dL — AB (ref 65–99)
GLUCOSE-CAPILLARY: 157 mg/dL — AB (ref 65–99)
GLUCOSE-CAPILLARY: 177 mg/dL — AB (ref 65–99)

## 2015-06-29 LAB — MRSA PCR SCREENING: MRSA by PCR: NEGATIVE

## 2015-06-29 LAB — TROPONIN I

## 2015-06-29 LAB — BRAIN NATRIURETIC PEPTIDE: B Natriuretic Peptide: 527 pg/mL — ABNORMAL HIGH (ref 0.0–100.0)

## 2015-06-29 LAB — MAGNESIUM: MAGNESIUM: 1.6 mg/dL — AB (ref 1.7–2.4)

## 2015-06-29 MED ORDER — METOPROLOL TARTRATE 25 MG PO TABS
12.5000 mg | ORAL_TABLET | Freq: Two times a day (BID) | ORAL | Status: DC
  Administered 2015-06-29 – 2015-07-03 (×7): 12.5 mg via ORAL
  Filled 2015-06-29 (×8): qty 1

## 2015-06-29 MED ORDER — ONDANSETRON HCL 4 MG/2ML IJ SOLN: 4.0000 mg | Freq: Four times a day (QID) | INTRAMUSCULAR | Status: DC | PRN

## 2015-06-29 MED ORDER — PANTOPRAZOLE SODIUM 40 MG PO TBEC
40.0000 mg | DELAYED_RELEASE_TABLET | Freq: Two times a day (BID) | ORAL | Status: DC
  Administered 2015-06-29 – 2015-07-03 (×8): 40 mg via ORAL
  Filled 2015-06-29 (×8): qty 1

## 2015-06-29 MED ORDER — AMIODARONE HCL 200 MG PO TABS
200.0000 mg | ORAL_TABLET | Freq: Every day | ORAL | Status: DC
  Administered 2015-06-30 – 2015-07-02 (×3): 200 mg via ORAL
  Filled 2015-06-29 (×5): qty 1

## 2015-06-29 MED ORDER — INFLUENZA VAC SPLIT QUAD 0.5 ML IM SUSY
0.5000 mL | PREFILLED_SYRINGE | INTRAMUSCULAR | Status: AC
  Administered 2015-06-30: 0.5 mL via INTRAMUSCULAR
  Filled 2015-06-29: qty 0.5

## 2015-06-29 MED ORDER — CHOLECALCIFEROL 10 MCG (400 UNIT) PO TABS
400.0000 [IU] | ORAL_TABLET | Freq: Every evening | ORAL | Status: DC
  Administered 2015-06-29 – 2015-07-02 (×4): 400 [IU] via ORAL
  Filled 2015-06-29 (×3): qty 1

## 2015-06-29 MED ORDER — SODIUM CHLORIDE 0.9 % IJ SOLN: 3.0000 mL | INTRAMUSCULAR | Status: DC | PRN

## 2015-06-29 MED ORDER — CHOLECALCIFEROL 10 MCG (400 UNIT) PO TABS
800.0000 [IU] | ORAL_TABLET | Freq: Every morning | ORAL | Status: DC
  Administered 2015-06-30 – 2015-07-03 (×4): 800 [IU] via ORAL
  Filled 2015-06-29 (×2): qty 2

## 2015-06-29 MED ORDER — SODIUM CHLORIDE 0.9 % IV SOLN
250.0000 mL | INTRAVENOUS | Status: DC | PRN
  Administered 2015-07-01: 13:00:00 via INTRAVENOUS

## 2015-06-29 MED ORDER — SUCRALFATE 1 G PO TABS
1.0000 g | ORAL_TABLET | Freq: Three times a day (TID) | ORAL | Status: DC
  Administered 2015-06-29 – 2015-07-03 (×13): 1 g via ORAL
  Filled 2015-06-29 (×13): qty 1

## 2015-06-29 MED ORDER — ASPIRIN EC 81 MG PO TBEC
81.0000 mg | DELAYED_RELEASE_TABLET | Freq: Every day | ORAL | Status: DC
  Administered 2015-06-29 – 2015-07-02 (×4): 81 mg via ORAL
  Filled 2015-06-29 (×4): qty 1

## 2015-06-29 MED ORDER — ENOXAPARIN SODIUM 40 MG/0.4ML ~~LOC~~ SOLN
40.0000 mg | Freq: Two times a day (BID) | SUBCUTANEOUS | Status: DC
  Administered 2015-06-29 – 2015-07-03 (×7): 40 mg via SUBCUTANEOUS
  Filled 2015-06-29 (×8): qty 0.4

## 2015-06-29 MED ORDER — METOPROLOL TARTRATE 1 MG/ML IV SOLN
INTRAVENOUS | Status: AC
  Filled 2015-06-29: qty 5

## 2015-06-29 MED ORDER — VITAMIN D3 10 MCG (400 UNIT) PO TABS: 400.0000 [IU] | ORAL_TABLET | Freq: Two times a day (BID) | ORAL | Status: DC

## 2015-06-29 MED ORDER — ENOXAPARIN SODIUM 40 MG/0.4ML ~~LOC~~ SOLN: 40.0000 mg | SUBCUTANEOUS | Status: DC

## 2015-06-29 MED ORDER — DILTIAZEM HCL 25 MG/5ML IV SOLN
INTRAVENOUS | Status: AC
  Administered 2015-06-29: 25 mg
  Filled 2015-06-29: qty 5

## 2015-06-29 MED ORDER — HYDROCODONE-ACETAMINOPHEN 5-325 MG PO TABS
1.0000 | ORAL_TABLET | ORAL | Status: DC | PRN
  Administered 2015-07-01 – 2015-07-02 (×3): 1 via ORAL
  Filled 2015-06-29 (×3): qty 1

## 2015-06-29 MED ORDER — OXYBUTYNIN CHLORIDE ER 5 MG PO TB24
10.0000 mg | ORAL_TABLET | Freq: Every day | ORAL | Status: DC
  Administered 2015-06-29 – 2015-07-02 (×4): 10 mg via ORAL
  Filled 2015-06-29 (×2): qty 1
  Filled 2015-06-29: qty 2
  Filled 2015-06-29: qty 1
  Filled 2015-06-29: qty 2

## 2015-06-29 MED ORDER — ADULT MULTIVITAMIN W/MINERALS CH
1.0000 | ORAL_TABLET | Freq: Every day | ORAL | Status: DC
  Administered 2015-06-30 – 2015-07-03 (×4): 1 via ORAL
  Filled 2015-06-29 (×5): qty 1

## 2015-06-29 MED ORDER — HYDROCODONE-ACETAMINOPHEN 5-325 MG PO TABS
ORAL_TABLET | ORAL | Status: AC
  Filled 2015-06-29: qty 1

## 2015-06-29 MED ORDER — DEXTROSE 5 % IV SOLN
5.0000 mg/h | INTRAVENOUS | Status: DC
  Administered 2015-06-29: 5 mg/h via INTRAVENOUS
  Administered 2015-06-30: 10 mg/h via INTRAVENOUS
  Filled 2015-06-29 (×2): qty 100

## 2015-06-29 MED ORDER — CALCIUM CARBONATE-VITAMIN D 500-200 MG-UNIT PO TABS
1.0000 | ORAL_TABLET | Freq: Two times a day (BID) | ORAL | Status: DC
  Administered 2015-06-29 – 2015-07-03 (×8): 1 via ORAL
  Filled 2015-06-29 (×9): qty 1

## 2015-06-29 MED ORDER — SODIUM CHLORIDE 0.9 % IJ SOLN
3.0000 mL | Freq: Two times a day (BID) | INTRAMUSCULAR | Status: DC
  Administered 2015-06-29 – 2015-07-03 (×6): 3 mL via INTRAVENOUS

## 2015-06-29 MED ORDER — ONDANSETRON HCL 4 MG PO TABS
4.0000 mg | ORAL_TABLET | Freq: Four times a day (QID) | ORAL | Status: DC | PRN
  Administered 2015-07-03: 4 mg via ORAL
  Filled 2015-06-29: qty 1

## 2015-06-29 MED ORDER — DILTIAZEM LOAD VIA INFUSION
10.0000 mg | Freq: Once | INTRAVENOUS | Status: AC
  Administered 2015-06-29: 10 mg via INTRAVENOUS
  Filled 2015-06-29: qty 10

## 2015-06-29 MED ORDER — SODIUM CHLORIDE 0.9 % IV BOLUS (SEPSIS)
500.0000 mL | Freq: Once | INTRAVENOUS | Status: AC
  Administered 2015-06-29: 500 mL via INTRAVENOUS

## 2015-06-29 MED ORDER — FUROSEMIDE 20 MG PO TABS
20.0000 mg | ORAL_TABLET | Freq: Every day | ORAL | Status: DC
  Administered 2015-06-30 – 2015-07-01 (×2): 20 mg via ORAL
  Filled 2015-06-29 (×5): qty 1

## 2015-06-29 MED ORDER — HYDROCORTISONE 1 % EX CREA
1.0000 | TOPICAL_CREAM | Freq: Every evening | CUTANEOUS | Status: DC | PRN
Start: 2015-06-29 — End: 2015-07-03
  Filled 2015-06-29: qty 28

## 2015-06-29 MED ORDER — ACETAMINOPHEN 650 MG RE SUPP: 650.0000 mg | Freq: Four times a day (QID) | RECTAL | Status: DC | PRN

## 2015-06-29 MED ORDER — ACETAMINOPHEN 325 MG PO TABS
650.0000 mg | ORAL_TABLET | Freq: Four times a day (QID) | ORAL | Status: DC | PRN
  Administered 2015-06-29: 650 mg via ORAL
  Filled 2015-06-29: qty 2

## 2015-06-29 MED ORDER — PAROXETINE HCL 20 MG PO TABS
20.0000 mg | ORAL_TABLET | Freq: Every day | ORAL | Status: DC
  Administered 2015-06-30 – 2015-07-03 (×4): 20 mg via ORAL
  Filled 2015-06-29 (×5): qty 1

## 2015-06-29 MED ORDER — DOCUSATE SODIUM 100 MG PO CAPS
200.0000 mg | ORAL_CAPSULE | Freq: Every day | ORAL | Status: DC
  Administered 2015-06-29 – 2015-07-02 (×4): 200 mg via ORAL
  Filled 2015-06-29 (×4): qty 2

## 2015-06-29 MED ORDER — METOPROLOL TARTRATE 1 MG/ML IV SOLN
5.0000 mg | Freq: Once | INTRAVENOUS | Status: AC
  Administered 2015-06-29: 5 mg via INTRAVENOUS

## 2015-06-29 MED ORDER — HYDROCODONE-ACETAMINOPHEN 5-325 MG PO TABS
1.0000 | ORAL_TABLET | Freq: Once | ORAL | Status: AC
  Administered 2015-06-29: 1 via ORAL

## 2015-06-29 MED ORDER — INSULIN ASPART 100 UNIT/ML ~~LOC~~ SOLN
0.0000 [IU] | Freq: Three times a day (TID) | SUBCUTANEOUS | Status: DC
  Administered 2015-06-30 (×2): 1 [IU] via SUBCUTANEOUS
  Administered 2015-06-30: 2 [IU] via SUBCUTANEOUS
  Administered 2015-07-01: 7 [IU] via SUBCUTANEOUS
  Administered 2015-07-01: 1 [IU] via SUBCUTANEOUS
  Administered 2015-07-02: 5 [IU] via SUBCUTANEOUS
  Administered 2015-07-02: 7 [IU] via SUBCUTANEOUS
  Administered 2015-07-03: 2 [IU] via SUBCUTANEOUS
  Filled 2015-06-29: qty 3
  Filled 2015-06-29: qty 7
  Filled 2015-06-29: qty 1
  Filled 2015-06-29: qty 5
  Filled 2015-06-29: qty 7
  Filled 2015-06-29 (×2): qty 1
  Filled 2015-06-29: qty 2

## 2015-06-29 MED ORDER — INSULIN DETEMIR 100 UNIT/ML ~~LOC~~ SOLN
40.0000 [IU] | Freq: Every day | SUBCUTANEOUS | Status: DC
  Administered 2015-06-29 – 2015-07-02 (×4): 40 [IU] via SUBCUTANEOUS
  Filled 2015-06-29 (×6): qty 0.4

## 2015-06-29 MED ORDER — PRAVASTATIN SODIUM 20 MG PO TABS
20.0000 mg | ORAL_TABLET | Freq: Every day | ORAL | Status: DC
  Administered 2015-06-29 – 2015-07-02 (×4): 20 mg via ORAL
  Filled 2015-06-29 (×4): qty 1

## 2015-06-29 NOTE — ED Notes (Signed)
Pt given cardizem bolus, bp tolerated well, 111/78, HR 113.

## 2015-06-29 NOTE — ED Notes (Signed)
Admitting MD at bedside.

## 2015-06-29 NOTE — ED Notes (Addendum)
Pt having afib and aflutter at this time. Rate 107-127.

## 2015-06-29 NOTE — ED Notes (Signed)
Pt states for the past month she has had issues with controlling a-fib , states she has had the chest pain and SOB since 10/2 but today when she saw her foot doctor they were concerned and sent her to the ED. States she has not had any changes in the past 3 weeks

## 2015-06-29 NOTE — ED Notes (Signed)
Bruising noted at site of right fa iv site. Site intact, pt denies pain or stinging, site draws back blood.

## 2015-06-29 NOTE — H&P (Signed)
[facility] NAME: Deborah Obrien    MR#:  [id number]  DATE OF BIRTH:  27-Jun-1941  DATE OF ADMISSION:  06/29/2015  PRIMARY CARE PHYSICIAN: [name]., MD   REQUESTING/REFERRING PHYSICIAN: Dr. Marcelene Butte  CHIEF COMPLAINT:   Palpitations since yesterday HISTORY OF PRESENT ILLNESS:  Deborah Obrien  is a 74 y.o. female with a known history of chronic paroxysmal A. fib not on any anticoagulation due to GI bleed in the past, polypharmacy allergy, chronic kidney disease stage III, morbid obesity, hypertension, type 2 diabetes comes to the emergency room after she was seen and [place] [facility] due to ongoing palpitations since yesterday. Patient was recently seen by cardiology Dr. Nehemiah Massed on 06/25/2015 and her metoprolol and amiodarone dosage were adjusted. The patient continued to feel poorly for the last couple days more so since yesterday was seen at clinic clinic was found to have heart rate in the 120s she was found to be in rapid A. fib came to the emergency room is now on IV Cardizem drip after 10 mg of IV Cardizem was given to her. She denies any chest pressure or chest pain or shortness of breath. She is being admitted for further nausea management.  Patient had undergone cardioversion about a year ago for the same problem  PAST MEDICAL HISTORY:   Past Medical History  Diagnosis Date  . Diabetes mellitus without complication ([place])   . Hypertension   . Morbid obesity ([place])   . Chronic kidney disease (CKD) stage G3a/A1, moderately decreased glomerular filtration rate (GFR) between 45-59 mL/min/1.73 square meter and albuminuria creatinine ratio less than 30 mg/g   . Paroxysmal atrial fibrillation ([place])     PAST SURGICAL HISTOIRY:   Past Surgical History  Procedure Laterality Date  . Shoulder arthroscopy with subacromial decompression Left   . Esophagogastroduodenoscopy N/A 01/27/2015    Procedure:  ESOPHAGOGASTRODUODENOSCOPY (EGD);  Surgeon: Lollie Sails, MD;  Location: [facility] ENDOSCOPY;  Service: Endoscopy;  Laterality: N/A;  . Colon surgery      SOCIAL HISTORY:   Social History  Substance Use Topics  . Smoking status: Never Smoker   . Smokeless tobacco: Not on file  . Alcohol Use: No    FAMILY HISTORY:  No family history on file.  DRUG ALLERGIES:   Allergies  Allergen Reactions  . Cephalosporins Other (See Comments)    Reaction:  Unknown   . Hydrocodone-Acetaminophen Hives  . Macrolides And Ketolides Other (See Comments)    Reaction:  Unknown   . Meperidine Shortness Of Breath, Nausea Only and Other (See Comments)    Reaction:  Stomach pain   . Shellfish Allergy Shortness Of Breath, Diarrhea, Nausea Only, Rash and Other (See Comments)    Reaction:  Stomach pain   . Sulfa Antibiotics Shortness Of Breath, Diarrhea, Nausea Only and Other (See Comments)    Reaction:  Stomach pain   . Aspirin Other (See Comments)    Reaction:  Stomach pain   . Celecoxib Other (See Comments)    Reaction:  GI bleed, weakness, and stomach pain.    . Cephalexin Diarrhea, Nausea Only and Other (See Comments)    Reaction:  Stomach pain   . Erythromycin Diarrhea, Nausea Only and Other (See Comments)    Reaction:  Stomach pain   . Hydromorphone Other (See Comments)    Reaction:  Hypotension   . Iodinated Diagnostic Agents Rash and Other (See Comments)    Pt states that she is  unable to have because she has chronic kidney disease.    Marland Kitchen Oxycodone Other (See Comments)    Reaction:  Stomach pain   . Atorvastatin Nausea Only and Other (See Comments)    Reaction:  Weakness   . Codeine Diarrhea, Nausea Only and Other (See Comments)    Reaction:  Stomach pain   . Prednisone Other (See Comments)    Reaction:  Increases pts blood sugar  Pt states that she is allergic to all steroids.    . Tape Other (See Comments)    Reaction:  Causes pts skin to tear  Pt states that she is able to use paper  tape.    . Valdecoxib Swelling and Other (See Comments)    Pt states that her hands and feet swell.    . Iodine Rash    REVIEW OF SYSTEMS:  Review of Systems  Constitutional: Negative for fever, chills and weight loss.  HENT: Negative for ear discharge, ear pain and nosebleeds.   Eyes: Negative for blurred vision, pain and discharge.  Respiratory: Negative for sputum production, shortness of breath, wheezing and stridor.   Cardiovascular: Positive for chest pain and palpitations. Negative for orthopnea and PND.  Gastrointestinal: Negative for nausea, vomiting, abdominal pain and diarrhea.  Genitourinary: Negative for urgency and frequency.  Musculoskeletal: Negative for back pain and joint pain.  Neurological: Positive for weakness. Negative for sensory change, speech change and focal weakness.  Psychiatric/Behavioral: Negative for depression and hallucinations. The patient is not nervous/anxious.   All other systems reviewed and are negative.    MEDICATIONS AT HOME:   Prior to Admission medications   Medication Sig Start Date End Date Taking? Authorizing Provider  amiodarone (PACERONE) 200 MG tablet Take 200 mg by mouth daily.   Yes Historical Provider, MD  APPLE CIDER VINEGAR PO Take 1 tablet by mouth 2 (two) times daily.   Yes Historical Provider, MD  aspirin EC 81 MG tablet Take 81 mg by mouth at bedtime.   Yes Historical Provider, MD  Calcium Carbonate-Vitamin D (CALCIUM 600+D) 600-400 MG-UNIT tablet Take 1 tablet by mouth 2 (two) times daily.   Yes Historical Provider, MD  Cholecalciferol (VITAMIN D3) 400 UNITS tablet Take 400-800 Units by mouth 2 (two) times daily. Pt takes two tablets in the morning and one tablet at bedtime.   Yes Historical Provider, MD  CINNAMON PO Take 2 tablets by mouth 2 (two) times daily.   Yes Historical Provider, MD  Cranberry-Vitamin C (AZO CRANBERRY URINARY TRACT) 250-60 MG CAPS Take 2 capsules by mouth 2 (two) times daily.   Yes Historical Provider,  MD  docusate sodium (COLACE) 100 MG capsule Take 200 mg by mouth at bedtime.   Yes Historical Provider, MD  furosemide (LASIX) 20 MG tablet Take 20 mg by mouth daily.   Yes Historical Provider, MD  hydrocortisone 2.5 % cream Apply 1 application topically at bedtime as needed (for rash under breasts).    Yes Historical Provider, MD  insulin aspart (NOVOLOG) 100 UNIT/ML injection Inject 15-35 Units into the skin 3 (three) times daily before meals. Pt uses as needed per sliding scale:    0-150:  15 units  151-200:  17 units  201-250:  19 units  251-300:  23 units  301-350:  27 units  351-400:  31 units  401 and greater:  35 units   Yes Historical Provider, MD  insulin detemir (LEVEMIR) 100 UNIT/ML injection Inject 40 Units into the skin at bedtime.   Yes  Historical Provider, MD  ketoconazole (NIZORAL) 2 % cream Apply 1 application topically every 2 (two) hours as needed (for rash under breasts).    Yes Historical Provider, MD  metoprolol tartrate (LOPRESSOR) 25 MG tablet Take 12.5 tablets by mouth 2 (two) times daily.    Yes Historical Provider, MD  Multiple Vitamin (MULTIVITAMIN WITH MINERALS) TABS tablet Take 1 tablet by mouth daily.   Yes Historical Provider, MD  oxybutynin (DITROPAN-XL) 10 MG 24 hr tablet Take 10 mg by mouth at bedtime.   Yes Historical Provider, MD  pantoprazole (PROTONIX) 40 MG tablet Take 40 mg by mouth 2 (two) times daily.   Yes Historical Provider, MD  PARoxetine (PAXIL) 20 MG tablet Take 20 mg by mouth daily.   Yes Historical Provider, MD  pravastatin (PRAVACHOL) 20 MG tablet Take 1 tablet by mouth at bedtime.    Yes Historical Provider, MD  sucralfate (CARAFATE) 1 G tablet Take 1 tablet by mouth 4 (four) times daily -  before meals and at bedtime.   Yes Historical Provider, MD  talc (ZEASORB) powder Apply 1 application topically as needed (for rash under breasts).   Yes Historical Provider, MD      VITAL SIGNS:  Blood pressure 111/78, pulse 105, temperature 98.1 F  (36.7 C), temperature source Oral, resp. rate 27, height 5\' 4"  (1.626 m), weight 118.842 kg (262 lb), SpO2 100 %.  PHYSICAL EXAMINATION:  GENERAL:  74 y.o.-year-old patient lying in the bed with no acute distress.  EYES: Pupils equal, round, reactive to light and accommodation. No scleral icterus. Extraocular muscles intact.  HEENT: Head atraumatic, normocephalic. Oropharynx and nasopharynx clear.  NECK:  Supple, no jugular venous distention. No thyroid enlargement, no tenderness.  LUNGS: Normal breath sounds bilaterally, no wheezing, rales,rhonchi or crepitation. No use of accessory muscles of respiration.  CARDIOVASCULAR: S1, S2 irregularly irregular. No murmurs, rubs, or gallops.  ABDOMEN: Soft, nontender, nondistended. Bowel sounds present. No organomegaly or mass.  EXTREMITIES: No pedal edema, cyanosis, or clubbing.  NEUROLOGIC: Cranial nerves II through XII are intact. Muscle strength 5/5 in all extremities. Sensation intact. Gait not checked.  PSYCHIATRIC: The patient is alert and oriented x 3.  SKIN: No obvious rash, lesion, or ulcer.   LABORATORY PANEL:   CBC  Recent Labs Lab 06/29/15 1254  WBC 6.0  HGB 11.9*  HCT 38.0  PLT 248   ------------------------------------------------------------------------------------------------------------------  Chemistries   Recent Labs Lab 06/29/15 1254  NA 136  K 4.0  CL 104  CO2 24  GLUCOSE 230*  BUN 33*  CREATININE 1.67*  CALCIUM 9.0  MG 1.6*  AST 34  ALT 20  ALKPHOS 98  BILITOT 1.2   ------------------------------------------------------------------------------------------------------------------  Cardiac Enzymes  Recent Labs Lab 06/29/15 1254  TROPONINI <0.03   ------------------------------------------------------------------------------------------------------------------  RADIOLOGY:  Dg Chest Port 1 View  06/29/2015  CLINICAL DATA:  Atrial fibrillation EXAM: PORTABLE CHEST 1 VIEW COMPARISON:  06/14/2015  chest radiograph FINDINGS: Stable cardiomediastinal silhouette with mild cardiomegaly. No pneumothorax. No pleural effusion. Clear lungs, with no focal lung consolidation and no pulmonary edema. Partially visualized is surgical plate with interlocking screws overlying the lower cervical spine. IMPRESSION: Stable mild cardiomegaly without pulmonary edema.  Clear lungs. Electronically Signed   By: Ilona Sorrel M.D.   On: 06/29/2015 13:56    EKG:  Rapid Afib with RVR  IMPRESSION AND PLAN:  74 y.o. female with a known history of chronic paroxysmal A. fib not on any anticoagulation due to GI bleed in the past, polypharmacy  allergy, chronic kidney disease stage III, morbid obesity, hypertension, type 2 diabetes comes to the emergency room after she was seen and [facility] due to ongoing palpitations since yesterday.  1. Rapid A. fib with RVR Patient presented with palpitations since yesterday. Heart rate was in the 120s. Admit to ICU IV Cardizem drip Continue by mouth amiodarone and by mouth metoprolol Cardiology consultation with Dr. Pearletha Forge  I will hold off on anticoagulation defer to cardiology for it. Patient has had GI bleed in the past  Continue baby aspirin   2. Type 2 diabetes   continue sliding scale insulin and her Levemir.  3. Hyperlipidemia on pravastatin  4. GERD Continue Protonix and sucralfate  5. DVT prophylaxis Subcutaneous Lovenox    All on  the records are reviewed and case discussed with ED provider. Management plans discussed with the patient, family and they are in agreement.  CODE STATUS: full  TOTAL critical  TIME TAKING CARE OF THIS PATIENT: 50  minutes.    Tinsley Lomas M.D on 06/29/2015 at 4:08 PM  Between 7am to 6pm - Pager - [phone]  After 6pm go to [email or web address] - password [email or web address] [facility]  Office  [phone]  CC: Primary care physician; [name]., MD

## 2015-06-29 NOTE — Consult Note (Signed)
[place]  CARDIOLOGY CONSULT NOTE  Patient ID: Deborah DESAUTELS MRN: [id number] DOB/AGE: [birth year] 74 y.o.  Admit date: 06/29/2015 Referring Physician Dr. Posey Pronto Primary Physician   Primary Cardiologist Nehemiah Massed Reason for Consultation afib with rvr  HPI: Pt is a 74 yo female with history of afib with variable vr. She has had a long history of afib and has been on a variety of meds for rate control. She has been cardioverted several times in the past when she was anticoagulated but recently had not been able to be anticoagulated due to bleeding issues when on warfarin. She has had tachycardia and bradycardia with amiodarone and beta or calcium channel blockers. She presented to the er after noting her afib rate was rapid today. She is hemodynamcially stable and has good rate control after a bolus of cardizem and placed on a cardizem drip. She is not currently anticoagulated due to bleeding with anticoagulation in the past. She will be admitted on a cardizem drip and further recs based on results.   ROS Review of Systems - History obtained from chart review and the patient General ROS: positive for  - palpitations Respiratory ROS: positive for - shortness of breath Cardiovascular ROS: positive for - chest pain Gastrointestinal ROS: no abdominal pain, change in bowel habits, or black or bloody stools Neurological ROS: no TIA or stroke symptoms   Past Medical History  Diagnosis Date  . Diabetes mellitus without complication ([place])   . Hypertension   . Morbid obesity ([place])   . Chronic kidney disease (CKD) stage G3a/A1, moderately decreased glomerular filtration rate (GFR) between 45-59 mL/min/1.73 square meter and albuminuria creatinine ratio less than 30 mg/g   . Paroxysmal atrial fibrillation ([facility])     No family history on file.  Social History   Social History  . Marital Status: Married    Spouse Name: N/A  . Number of Children: N/A  .  Years of Education: N/A   Occupational History  . Not on file.   Social History Main Topics  . Smoking status: Never Smoker   . Smokeless tobacco: Not on file  . Alcohol Use: No  . Drug Use: Not on file  . Sexual Activity: Yes   Other Topics Concern  . Not on file   Social History Narrative    Past Surgical History  Procedure Laterality Date  . Shoulder arthroscopy with subacromial decompression Left   . Esophagogastroduodenoscopy N/A 01/27/2015    Procedure: ESOPHAGOGASTRODUODENOSCOPY (EGD);  Surgeon: Lollie Sails, MD;  [facility] END[facility]COPY;  Service: Endoscopy;  Laterality: N/A;  . Colon surgery       Prescriptions prior to admission  Medication Sig Dispense Refill Last Dose  . amiodarone (PACERONE) 200 MG tablet Take 200 mg by mouth daily.   unknown at unknown  . APPLE CIDER VINEGAR PO Take 1 tablet by mouth 2 (two) times daily.   unknown at unknown  . aspirin EC 81 MG tablet Take 81 mg by mouth at bedtime.   unknown at unknown  . Calcium Carbonate-Vitamin D (CALCIUM 600+D) 600-400 MG-UNIT tablet Take 1 tablet by mouth 2 (two) times daily.   unknown at unknown  . Cholecalciferol (VITAMIN D3) 400 UNITS tablet Take 400-800 Units by mouth 2 (two) times daily. Pt takes two tablets in the morning and one tablet at bedtime.   unknown at unknown  . CINNAMON PO Take 2 tablets by mouth 2 (two) times daily.   unknown at unknown  .  Cranberry-Vitamin C (AZO CRANBERRY URINARY TRACT) 250-60 MG CAPS Take 2 capsules by mouth 2 (two) times daily.   unknown at unknown  . docusate sodium (COLACE) 100 MG capsule Take 200 mg by mouth at bedtime.   unknown at unknown  . furosemide (LASIX) 20 MG tablet Take 20 mg by mouth daily.   unknown at unknown  . hydrocortisone 2.5 % cream Apply 1 application topically at bedtime as needed (for rash under breasts).    PRN at PRN  . insulin aspart (NOVOLOG) 100 UNIT/ML injection Inject 15-35 Units into the skin 3 (three) times daily before meals. Pt  uses as needed per sliding scale:    0-150:  15 units  151-200:  17 units  201-250:  19 units  251-300:  23 units  301-350:  27 units  351-400:  31 units  401 and greater:  35 units   unknown at unknown  . insulin detemir (LEVEMIR) 100 UNIT/ML injection Inject 40 Units into the skin at bedtime.   unknown at unknown  . ketoconazole (NIZORAL) 2 % cream Apply 1 application topically every 2 (two) hours as needed (for rash under breasts).    PRN at PRN  . metoprolol tartrate (LOPRESSOR) 25 MG tablet Take 12.5 tablets by mouth 2 (two) times daily.    unknown at unknown  . Multiple Vitamin (MULTIVITAMIN WITH MINERALS) TABS tablet Take 1 tablet by mouth daily.   unknown at unknown  . oxybutynin (DITROPAN-XL) 10 MG 24 hr tablet Take 10 mg by mouth at bedtime.   unknown at unknown  . pantoprazole (PROTONIX) 40 MG tablet Take 40 mg by mouth 2 (two) times daily.   unknown at unknown  . PARoxetine (PAXIL) 20 MG tablet Take 20 mg by mouth daily.   unknown at unknown  . pravastatin (PRAVACHOL) 20 MG tablet Take 1 tablet by mouth at bedtime.    unknown at unknown  . sucralfate (CARAFATE) 1 G tablet Take 1 tablet by mouth 4 (four) times daily -  before meals and at bedtime.   unknown at unknown  . talc (ZEASORB) powder Apply 1 application topically as needed (for rash under breasts).   PRN at PRN    Physical Exam: Blood pressure 102/61, pulse 53, temperature 98.1 F (36.7 C), temperature source Oral, resp. rate 17, height 5\' 4"  (1.626 m), weight 118 kg (260 lb 2.3 oz), SpO2 97 %.    General appearance: alert and cooperative Resp: clear to auscultation bilaterally Cardio: irregularly irregular rhythm GI: soft, non-tender; bowel sounds normal; no masses,  no organomegaly Extremities: extremities normal, atraumatic, no cyanosis or edema Neurologic: Grossly normal Labs:   Lab Results  Component Value Date   WBC 6.0 06/29/2015   HGB 11.9* 06/29/2015   HCT 38.0 06/29/2015   MCV 84.1 06/29/2015   PLT  248 06/29/2015    Recent Labs Lab 06/29/15 1254  NA 136  K 4.0  CL 104  CO2 24  BUN 33*  CREATININE 1.67*  CALCIUM 9.0  PROT 7.7  BILITOT 1.2  ALKPHOS 98  ALT 20  AST 34  GLUCOSE 230*   Lab Results  Component Value Date   TROPONINI <0.03 06/29/2015      Radiology: No pulmonary edema EKG: afib with variable /rapid ventricular response  ASSESSMENT AND PLAN:  74 yo female with history of chronic afib and rapid ventricular response on admission. She has had problems with tachycardia and bradycardia with her rate control meds in the past. She is not  a candidate for chronic anticoagulation due to bleeding problems in the past. She is not a candidate for cardioversion due to lack of anticoagulaiton. Discussion will need to be carried out consideration for pacemaker back up so that rate control meds can be used without concern over bradycardia. Would contineu with iv diltiazem for now and follow rate. Will discuss ppm back up with her primary cardiologist. Continue with amiodarone at 200 mg daily for now.  Signed: Teodoro Spray MD, [employer] [facility] 06/29/2015, 8:25 PM

## 2015-06-29 NOTE — ED Notes (Signed)
Husband at bedside.  

## 2015-06-29 NOTE — Progress Notes (Signed)
Pt BMI is >40. Per policy, lovenox is to be dosed 40mg  q 12 hours is BMI>40. Per protocol, dose will be changed. Ramond Dial, Pharm.D Clinical Pharmacist

## 2015-06-29 NOTE — ED Provider Notes (Signed)
Time Seen: Approximately 1540 I have reviewed the triage notes  Chief Complaint: Atrial Fibrillation   History of Present Illness: Deborah Obrien is a 74 y.o. female who presents with a history of paroxysmal atrial fibrillation. Patient has been cardioverted before and is not on any chronic anticoagulation therapy. The patient states she noticed an increase in her heart rate which started yesterday. There has been some adjusting of her beta blocker therapy at home. She was seen and evaluated by her cardiologist who referred her here for evaluation and likely admission for atrial fibrillation. As a possible echocardiogram. Patient states she feels the heart fluttering and heart palpitations and has some right-sided chest discomfort. He states he's had similar pain before in the past that seems to be more intense than usual. She denies any lightheadedness or syncopal episode.   Past Medical History  Diagnosis Date  . Diabetes mellitus without complication ([place])   . Hypertension   . Morbid obesity ([place])   . Chronic kidney disease (CKD) stage G3a/A1, moderately decreased glomerular filtration rate (GFR) between 45-59 mL/min/1.73 square meter and albuminuria creatinine ratio less than 30 mg/g   . Paroxysmal atrial fibrillation [facility])     Patient Active Problem List   Diagnosis Date Noted  . Intestinal obstruction ([place]) 02/20/2015    Past Surgical History  Procedure Laterality Date  . Shoulder arthroscopy with subacromial decompression Left   . Esophagogastroduodenoscopy N/A 01/27/2015    Procedure: ESOPHAGOGASTRODUODENOSCOPY (EGD);  Surgeon: Lollie Sails, MD;  Location: [facility] ENDOSCOPY;  Service: Endoscopy;  Laterality: N/A;  . Colon surgery      Past Surgical History  Procedure Laterality Date  . Shoulder arthroscopy with subacromial decompression Left   . Esophagogastroduodenoscopy N/A 01/27/2015    Procedure: ESOPHAGOGASTRODUODENOSCOPY (EGD);  Surgeon: Lollie Sails, MD;   Location: [facility] ENDOSCOPY;  Service: Endoscopy;  Laterality: N/A;  . Colon surgery      Current Outpatient Rx  Name  Route  Sig  Dispense  Refill  . amiodarone (PACERONE) 200 MG tablet   Oral   Take 200 mg by mouth daily.         . APPLE CIDER VINEGAR PO   Oral   Take 1 tablet by mouth 2 (two) times daily.         Marland Kitchen aspirin EC 81 MG tablet   Oral   Take 81 mg by mouth at bedtime.         . Calcium Carbonate-Vitamin D (CALCIUM 600+D) 600-400 MG-UNIT tablet   Oral   Take 1 tablet by mouth 2 (two) times daily.         . Cholecalciferol (VITAMIN D3) 400 UNITS tablet   Oral   Take 400-800 Units by mouth 2 (two) times daily. Pt takes two tablets in the morning and one tablet at bedtime.         Marland Kitchen CINNAMON PO   Oral   Take 2 tablets by mouth 2 (two) times daily.         . Cranberry-Vitamin C (AZO CRANBERRY URINARY TRACT) 250-60 MG CAPS   Oral   Take 2 capsules by mouth 2 (two) times daily.         Marland Kitchen docusate sodium (COLACE) 100 MG capsule   Oral   Take 200 mg by mouth at bedtime.         . furosemide (LASIX) 20 MG tablet   Oral   Take 20 mg by mouth daily.         Marland Kitchen  hydrocortisone 2.5 % cream   Topical   Apply 1 application topically at bedtime as needed (for rash under breasts).          . insulin aspart (NOVOLOG) 100 UNIT/ML injection   Subcutaneous   Inject 15-35 Units into the skin 3 (three) times daily before meals. Pt uses as needed per sliding scale:    0-150:  15 units  151-200:  17 units  201-250:  19 units  251-300:  23 units  301-350:  27 units  351-400:  31 units  401 and greater:  35 units         . insulin detemir (LEVEMIR) 100 UNIT/ML injection   Subcutaneous   Inject 40 Units into the skin at bedtime.         Marland Kitchen ketoconazole (NIZORAL) 2 % cream   Topical   Apply 1 application topically every 2 (two) hours as needed (for rash under breasts).          . metoprolol tartrate (LOPRESSOR) 25 MG tablet   Oral   Take 12.5  tablets by mouth 2 (two) times daily.          . Multiple Vitamin (MULTIVITAMIN WITH MINERALS) TABS tablet   Oral   Take 1 tablet by mouth daily.         Marland Kitchen oxybutynin (DITROPAN-XL) 10 MG 24 hr tablet   Oral   Take 10 mg by mouth at bedtime.         . pantoprazole (PROTONIX) 40 MG tablet   Oral   Take 40 mg by mouth 2 (two) times daily.         Marland Kitchen PARoxetine (PAXIL) 20 MG tablet   Oral   Take 20 mg by mouth daily.         . pravastatin (PRAVACHOL) 20 MG tablet   Oral   Take 1 tablet by mouth at bedtime.          . sucralfate (CARAFATE) 1 G tablet   Oral   Take 1 tablet by mouth 4 (four) times daily -  before meals and at bedtime.         . talc (ZEASORB) powder   Topical   Apply 1 application topically as needed (for rash under breasts).           Allergies:  Cephalosporins; Hydrocodone-acetaminophen; Macrolides and ketolides; Meperidine; Shellfish allergy; Sulfa antibiotics; Aspirin; Celecoxib; Cephalexin; Erythromycin; Hydromorphone; Iodinated diagnostic agents; Oxycodone; Atorvastatin; Codeine; Prednisone; Tape; Valdecoxib; and Iodine  Family History: No family history on file.  Social History: Social History  Substance Use Topics  . Smoking status: Never Smoker   . Smokeless tobacco: None  . Alcohol Use: No     Review of Systems:   10 point review of systems was performed and was otherwise negative:  Constitutional: No fever Eyes: No visual disturbances ENT: No sore throat, ear pain Cardiac: No chest pain Respiratory: No shortness of breath, wheezing, or stridor Abdomen: No abdominal pain, no vomiting, No diarrhea Endocrine: No weight loss, No night sweats Extremities: No peripheral edema, cyanosis Skin: No rashes, easy bruising Neurologic: No focal weakness, trouble with speech or swollowing Urologic: No dysuria, Hematuria, or urinary frequency   Physical Exam:  ED Triage Vitals  Enc Vitals Group     BP 06/29/15 1228 111/61 mmHg      Pulse Rate 06/29/15 1228 122     Resp 06/29/15 1228 24     Temp 06/29/15 1228 98.1 F (36.7 C)  Temp Source 06/29/15 1228 Oral     SpO2 06/29/15 1228 98 %     Weight 06/29/15 1228 262 lb (118.842 kg)     Height 06/29/15 1228 5\' 4"  (1.626 m)     Head Cir --      Peak Flow --      Pain Score 06/29/15 1228 8     Pain Loc --      Pain Edu? --      Excl. in [place]? --     General: Awake , Alert , and Oriented times 3; GCS 15 Head: Normal cephalic , atraumatic Eyes: Pupils equal , round, reactive to light Nose/Throat: No nasal drainage, patent upper airway without erythema or exudate.  Neck: Supple, Full range of motion, No anterior adenopathy or palpable thyroid masses Lungs: Clear to ascultation without wheezes , rhonchi, or rales Heart: Irregular rate, irregular rhythm without murmurs , gallops , or rubs Abdomen: Soft, non tender without rebound, guarding , or rigidity; bowel sounds positive and symmetric in all 4 quadrants. No organomegaly .        Extremities: 2 plus symmetric pulses. No edema, clubbing or cyanosis Neurologic: normal ambulation, Motor symmetric without deficits, sensory intact Skin: warm, dry, no rashes   Labs:   All laboratory work was reviewed including any pertinent negatives or positives listed below:  Labs Reviewed  COMPREHENSIVE METABOLIC PANEL - Abnormal; Notable for the following:    Glucose, Bld 230 (*)    BUN 33 (*)    Creatinine, Ser 1.67 (*)    GFR calc non Af Amer 29 (*)    GFR calc Af Amer 34 (*)    All other components within normal limits  CBC WITH DIFFERENTIAL/PLATELET - Abnormal; Notable for the following:    Hemoglobin 11.9 (*)    MCHC 31.4 (*)    RDW 20.2 (*)    All other components within normal limits  MAGNESIUM - Abnormal; Notable for the following:    Magnesium 1.6 (*)    All other components within normal limits  BRAIN NATRIURETIC PEPTIDE - Abnormal; Notable for the following:    B Natriuretic Peptide 527.0 (*)    All  other components within normal limits  GLUCOSE, CAPILLARY - Abnormal; Notable for the following:    Glucose-Capillary 234 (*)    All other components within normal limits  TROPONIN I   reviewed the patient's laboratory work shows room no significant abnormalities. There has been a slight bump in her creatinine level.  EKG:  ED ECG REPORT I, Daymon Larsen, the attending physician, personally viewed and interpreted this ECG.  Date: 06/29/2015 EKG Time: 1236 Rate: 123 Rhythm: Atrial flutter with a 2-1 conduction. QRS Axis: normal Intervals: normal ST/T Wave abnormalities: Nonspecific ST and T wave abnormality which may be rate dependent Conduction Disutrbances: none Narrative Interpretation: unremarkable    Radiology:     EXAM: PORTABLE CHEST 1 VIEW  COMPARISON: 06/14/2015 chest radiograph  FINDINGS: Stable cardiomediastinal silhouette with mild cardiomegaly. No pneumothorax. No pleural effusion. Clear lungs, with no focal lung consolidation and no pulmonary edema. Partially visualized is surgical plate with interlocking screws overlying the lower cervical spine.  IMPRESSION: Stable mild cardiomegaly without pulmonary edema. Clear lungs.   I personally reviewed the radiologic studies     Critical Care:  CRITICAL CARE Performed by: Daymon Larsen   Total critical care time: 37 minutes  Critical care time was exclusive of separately billable procedures and treating other patients.  Critical care  was necessary to treat or prevent imminent or life-threatening deterioration.  Critical care was time spent personally by me on the following activities: development of treatment plan with patient and/or surrogate as well as nursing, discussions with consultants, evaluation of patient's response to treatment, examination of patient, obtaining history from patient or surrogate, ordering and performing treatments and interventions, ordering and review of laboratory  studies, ordering and review of radiographic studies, pulse oximetry and re-evaluation of patient's condition. Addressing arrhythmias. Initiation of anti-arrhythmia medication    ED Course: Patient's stay here was uneventful and her blood pressure remained systolic in the [id number] range. Her chest pain improved with of giving Lopressor to slow her heart rate. Felt this was unlikely to be ischemic and thus far troponin is within normal limits. Patient appears to be flipping from atrial fibrillation to atrial flutter to a normal sinus tachycardia.   Assessment:  Acute exacerbation of chronic atrial fibrillation   Plan:  Patient size case was reviewed with the hospitalist team, further disposition and management depends upon her evaluation. She is not a candidate for immediate cardioversion at this time and remained hemodynamically stable and likely will need an echo cardiogram to assess to make sure there is no atrial clot present.            Daymon Larsen, MD 06/29/15 1535

## 2015-06-29 NOTE — Progress Notes (Signed)
Per nurse, pt has been taking norco since her knee surgery. Chart states that pt has allergy (hives) however pt admits to taking medication without problem and had one dose in ER. Will verify order for both tylenol and hydrocodone/apap Ramond Dial, Pharm.D Clinical Pharmacist

## 2015-06-29 NOTE — ED Notes (Signed)
Patient is resting comfortably. 

## 2015-06-30 DIAGNOSIS — I495 Sick sinus syndrome: Secondary | ICD-10-CM | POA: Diagnosis not present

## 2015-06-30 LAB — GLUCOSE, CAPILLARY
GLUCOSE-CAPILLARY: 127 mg/dL — AB (ref 65–99)
GLUCOSE-CAPILLARY: 192 mg/dL — AB (ref 65–99)
GLUCOSE-CAPILLARY: 142 mg/dL — AB (ref 65–99)
GLUCOSE-CAPILLARY: 224 mg/dL — AB (ref 65–99)

## 2015-06-30 LAB — TSH: TSH: 5.094 u[IU]/mL — AB (ref 0.350–4.500)

## 2015-06-30 MED ORDER — MAGNESIUM SULFATE 2 GM/50ML IV SOLN
2.0000 g | Freq: Once | INTRAVENOUS | Status: DC
Start: 2015-06-30 — End: 2015-07-03
  Filled 2015-06-30: qty 50

## 2015-06-30 MED ORDER — TRAZODONE HCL 50 MG PO TABS
25.0000 mg | ORAL_TABLET | Freq: Every evening | ORAL | Status: DC | PRN
  Administered 2015-06-30 (×2): 25 mg via ORAL
  Filled 2015-06-30 (×2): qty 1

## 2015-06-30 MED ORDER — MAGNESIUM OXIDE 400 (241.3 MG) MG PO TABS
400.0000 mg | ORAL_TABLET | Freq: Two times a day (BID) | ORAL | Status: AC
  Administered 2015-06-30 (×2): 400 mg via ORAL
  Filled 2015-06-30 (×2): qty 1

## 2015-06-30 NOTE — Progress Notes (Signed)
[facility]  SUBJECTIVE: Feels better with less discomfort and less rapid heart rate. Telemetry shows afib at rates in the mid 80s Was taken off iv cardizem last pm due to bradycardia.    Filed Vitals:   06/30/15 1000 06/30/15 1034 06/30/15 1100 06/30/15 1200  BP: 109/70 109/70 118/45 103/78  Pulse: 115 103 63 113  Temp:    97.1 F (36.2 C)  TempSrc:    Axillary  Resp: 13  17 20   Height:      Weight:      SpO2: 98%  98% 95%    Intake/Output Summary (Last 24 hours) at 06/30/15 1233 Last data filed at 06/30/15 1132  Gross per 24 hour  Intake 501.17 ml  Output    450 ml  Net  51.17 ml    LABS: Basic Metabolic Panel:  Recent Labs  06/29/15 1254  NA 136  K 4.0  CL 104  CO2 24  GLUCOSE 230*  BUN 33*  CREATININE 1.67*  CALCIUM 9.0  MG 1.6*   Liver Function Tests:  Recent Labs  06/29/15 1254  AST 34  ALT 20  ALKPHOS 98  BILITOT 1.2  PROT 7.7  ALBUMIN 4.1   No results for input(s): LIPASE, AMYLASE in the last 72 hours. CBC:  Recent Labs  06/29/15 1254  WBC 6.0  NEUTROABS 3.8  HGB 11.9*  HCT 38.0  MCV 84.1  PLT 248   Cardiac Enzymes:  Recent Labs  06/29/15 1254  TROPONINI <0.03   BNP: Invalid input(s): POCBNP D-Dimer: No results for input(s): DDIMER in the last 72 hours. Hemoglobin A1C: No results for input(s): HGBA1C in the last 72 hours. Fasting Lipid Panel: No results for input(s): CHOL, HDL, LDLCALC, TRIG, CHOLHDL, LDLDIRECT in the last 72 hours. Thyroid Function Tests: No results for input(s): TSH, T4TOTAL, T3FREE, THYROIDAB in the last 72 hours.  Invalid input(s): FREET3 Anemia Panel: No results for input(s): VITAMINB12, FOLATE, FERRITIN, TIBC, IRON, RETICCTPCT in the last 72 hours.   Physical Exam: Blood pressure 103/78, pulse 113, temperature 97.1 F (36.2 C), temperature source Axillary, resp. rate 20, height 5\' 4"  (1.626 m), weight 118 kg (260 lb 2.3 oz), SpO2 95 %. General appearance:  alert and cooperative Head: Normocephalic, without obvious abnormality, atraumatic Resp: clear to auscultation bilaterally Cardio: irregularly irregular rhythm GI: soft, non-tender; bowel sounds normal; no masses,  no organomegaly Extremities: extremities normal, atraumatic, no cyanosis or edema Neurologic: Grossly normal  TELEMETRY: Reviewed telemetry pt in afib with variable vr:  ASSESSMENT AND PLAN:  Active Problems:   A-fib (HCC)-patient with atrial fibrillation which is chronic.  She is not anticoagulated due to bleeding.  She is had tachy-brady issues with her AFib.  She remains on amiodarone and beta-blockers.    Became bradycardic with IV Cardizem.  Risk and benefits of backup pacemaker was discussed with the patient and her husband.  Will plan to proceed with VVI backup pacing to allow more aggressive treatment of her rate.  This will be scheduled for tomorrow around noontime    Teodoro Spray., MD, [facility] 06/30/2015 12:33 PM

## 2015-06-30 NOTE — Progress Notes (Signed)
   06/30/15 1400  Clinical Encounter Type  Visited With Patient;Patient and family together  Visit Type Initial;Spiritual support  Consult/Referral To Chaplain  Spiritual Encounters  Spiritual Needs Prayer;Emotional  Stress Factors  Patient Stress Factors Health changes  Chaplain rounded in the unit and offered a compassionate presence to the patient. She requested prayer and we prayed. Her spouse entered as we were praying.  Chaplain Ethelyne Erich A. Aldene Hendon Ext. [phone]

## 2015-06-30 NOTE — Progress Notes (Signed)
Spoke with Dr. Saralyn Pilar about placement of PICC line to left arm. PICC line goes above subclavian area and therefore it could interfere with the pacemaker and lead placement. After some discussion he wants PICC team to attempt a midline access and if unsuccessful then place in right upper arm.

## 2015-06-30 NOTE — Progress Notes (Signed)
[facility] [place] NAME: Deborah Obrien    MR#:  [id number]  DATE OF BIRTH:  [birth year]  SUBJECTIVE:  CHIEF COMPLAINT:   Chief Complaint  Patient presents with  . Atrial Fibrillation    Had palpitation before coming, feels much better now.  REVIEW OF SYSTEMS:  CONSTITUTIONAL: No fever, fatigue or weakness.  EYES: No blurred or double vision.  EARS, NOSE, AND THROAT: No tinnitus or ear pain.  RESPIRATORY: No cough, shortness of breath, wheezing or hemoptysis.  CARDIOVASCULAR: No chest pain, orthopnea, edema. Had palpitation. GASTROINTESTINAL: No nausea, vomiting, diarrhea or abdominal pain.  GENITOURINARY: No dysuria, hematuria.  ENDOCRINE: No polyuria, nocturia,  HEMATOLOGY: No anemia, easy bruising or bleeding SKIN: No rash or lesion. MUSCULOSKELETAL: No joint pain or arthritis.   NEUROLOGIC: No tingling, numbness, weakness.  PSYCHIATRY: No anxiety or depression.   ROS  DRUG ALLERGIES:   Allergies  Allergen Reactions  . Cephalosporins Other (See Comments)    Reaction:  Unknown   . Hydrocodone-Acetaminophen Hives  . Macrolides And Ketolides Other (See Comments)    Reaction:  Unknown   . Meperidine Shortness Of Breath, Nausea Only and Other (See Comments)    Reaction:  Stomach pain   . Shellfish Allergy Shortness Of Breath, Diarrhea, Nausea Only, Rash and Other (See Comments)    Reaction:  Stomach pain   . Sulfa Antibiotics Shortness Of Breath, Diarrhea, Nausea Only and Other (See Comments)    Reaction:  Stomach pain   . Aspirin Other (See Comments)    Reaction:  Stomach pain   . Celecoxib Other (See Comments)    Reaction:  GI bleed, weakness, and stomach pain.    . Cephalexin Diarrhea, Nausea Only and Other (See Comments)    Reaction:  Stomach pain   . Erythromycin Diarrhea, Nausea Only and Other (See Comments)    Reaction:  Stomach pain   . Hydromorphone Other (See Comments)    Reaction:  Hypotension   .  Iodinated Diagnostic Agents Rash and Other (See Comments)    Pt states that she is unable to have because she has chronic kidney disease.    Marland Kitchen Oxycodone Other (See Comments)    Reaction:  Stomach pain   . Atorvastatin Nausea Only and Other (See Comments)    Reaction:  Weakness   . Codeine Diarrhea, Nausea Only and Other (See Comments)    Reaction:  Stomach pain   . Prednisone Other (See Comments)    Reaction:  Increases pts blood sugar  Pt states that she is allergic to all steroids.    . Tape Other (See Comments)    Reaction:  Causes pts skin to tear  Pt states that she is able to use paper tape.    . Valdecoxib Swelling and Other (See Comments)    Pt states that her hands and feet swell.    . Iodine Rash    VITALS:  Blood pressure 118/65, pulse 111, temperature 97.5 F (36.4 C), temperature source Axillary, resp. rate 27, height 5\' 4"  (1.626 m), weight 118 kg (260 lb 2.3 oz), SpO2 97 %.  PHYSICAL EXAMINATION:  GENERAL:  74 y.o.-year-old patient lying in the bed with no acute distress.  EYES: Pupils equal, round, reactive to light and accommodation. No scleral icterus. Extraocular muscles intact.  HEENT: Head atraumatic, normocephalic. Oropharynx and nasopharynx clear.  NECK:  Supple, no jugular venous distention. No thyroid enlargement, no tenderness.  LUNGS: Normal breath sounds bilaterally, no  wheezing, rales,rhonchi or crepitation. No use of accessory muscles of respiration.  CARDIOVASCULAR: S1, S2 irregular, rate around 80. No murmurs, rubs, or gallops.  ABDOMEN: Soft, nontender, nondistended. Bowel sounds present. No organomegaly or mass.  EXTREMITIES: No pedal edema, cyanosis, or clubbing.  NEUROLOGIC: Cranial nerves II through XII are intact. Muscle strength 5/5 in all extremities. Sensation intact. Gait not checked.  PSYCHIATRIC: The patient is alert and oriented x 3.  SKIN: No obvious rash, lesion, or ulcer.   Physical Exam LABORATORY PANEL:   CBC  Recent Labs Lab  06/29/15 1254  WBC 6.0  HGB 11.9*  HCT 38.0  PLT 248   ------------------------------------------------------------------------------------------------------------------  Chemistries   Recent Labs Lab 06/29/15 1254  NA 136  K 4.0  CL 104  CO2 24  GLUCOSE 230*  BUN 33*  CREATININE 1.67*  CALCIUM 9.0  MG 1.6*  AST 34  ALT 20  ALKPHOS 98  BILITOT 1.2   ------------------------------------------------------------------------------------------------------------------  Cardiac Enzymes  Recent Labs Lab 06/29/15 1254  TROPONINI <0.03   ------------------------------------------------------------------------------------------------------------------  RADIOLOGY:  Dg Chest Port 1 View  06/29/2015  CLINICAL DATA:  Atrial fibrillation EXAM: PORTABLE CHEST 1 VIEW COMPARISON:  06/14/2015 chest radiograph FINDINGS: Stable cardiomediastinal silhouette with mild cardiomegaly. No pneumothorax. No pleural effusion. Clear lungs, with no focal lung consolidation and no pulmonary edema. Partially visualized is surgical plate with interlocking screws overlying the lower cervical spine. IMPRESSION: Stable mild cardiomegaly without pulmonary edema.  Clear lungs. Electronically Signed   By: Ilona Sorrel M.D.   On: 06/29/2015 13:56    ASSESSMENT AND PLAN:   Active Problems:   A-fib ([place])   1. Rapid A. fib with RVR  Heart rate was in the 120s. Admited to ICU with IV Cardizem drip Continue by mouth amiodarone and metoprolol Cardiology consultation with Dr. Pearletha Forge  I will hold off on anticoagulation defer to cardiology for it. Patient has had GI bleed in the past  Continue aspirin  Now off of cardizem drip since last night- still HR is controlled.  2. Type 2 diabetes  continue sliding scale insulin and her Levemir.  3. Hyperlipidemia on pravastatin  4. GERD Continue Protonix and sucralfate  5. DVT prophylaxis Subcutaneous Lovenox    All the records are reviewed and case  discussed with Care Management/Social Workerr. Management plans discussed with the patient, family and they are in agreement.  CODE STATUS: Full  TOTAL TIME TAKING CARE OF THIS PATIENT: 35 minutes.   As now HR is stable, will transfer and monitor on telemetry floor.  POSSIBLE D/C IN 1-2 DAYS, DEPENDING ON CLINICAL CONDITION.   Vaughan Basta M.D on 06/30/2015   Between 7am to 6pm - Pager - [phone]  After 6pm go to [email or web address] - password [email or web address] [facility]  Office  [phone]  CC: Primary care physician; [name]., MD  Note: This dictation was prepared with Dragon dictation along with smaller phrase technology. Any transcriptional errors that result from this process are unintentional.

## 2015-07-01 ENCOUNTER — Encounter
Admission: EM | Disposition: A | Discharge status: Home / Self Care | Payer: Self-pay | Source: Home / Self Care | Attending: Internal Medicine

## 2015-07-01 ENCOUNTER — Encounter: Payer: Self-pay | Admitting: Anesthesiology

## 2015-07-01 ENCOUNTER — Inpatient Hospital Stay: Payer: Medicare Other | Admitting: Anesthesiology

## 2015-07-01 ENCOUNTER — Inpatient Hospital Stay: Payer: Medicare Other

## 2015-07-01 DIAGNOSIS — Z950 Presence of cardiac pacemaker: Secondary | ICD-10-CM

## 2015-07-01 HISTORY — PX: PACEMAKER INSERTION: SHX728

## 2015-07-01 HISTORY — DX: Presence of cardiac pacemaker: Z95.0

## 2015-07-01 LAB — CBC
HCT: 33.6 % — ABNORMAL LOW (ref 35.0–47.0)
Hemoglobin: 10.4 g/dL — ABNORMAL LOW (ref 12.0–16.0)
MCH: 26.2 pg (ref 26.0–34.0)
MCHC: 31 g/dL — AB (ref 32.0–36.0)
MCV: 84.5 fL (ref 80.0–100.0)
PLATELETS: 212 10*3/uL (ref 150–440)
RBC: 3.98 MIL/uL (ref 3.80–5.20)
RDW: 19.6 % — ABNORMAL HIGH (ref 11.5–14.5)
WBC: 4.2 10*3/uL (ref 3.6–11.0)

## 2015-07-01 LAB — BASIC METABOLIC PANEL
Anion gap: 7 (ref 5–15)
BUN: 34 mg/dL — AB (ref 6–20)
CO2: 30 mmol/L (ref 22–32)
Calcium: 8.9 mg/dL (ref 8.9–10.3)
Chloride: 101 mmol/L (ref 101–111)
Creatinine, Ser: 1.92 mg/dL — ABNORMAL HIGH (ref 0.44–1.00)
GFR, EST AFRICAN AMERICAN: 29 mL/min — AB (ref >60)
GFR, EST NON AFRICAN AMERICAN: 25 mL/min — AB (ref >60)
Glucose, Bld: 332 mg/dL — ABNORMAL HIGH (ref 65–99)
POTASSIUM: 4.9 mmol/L (ref 3.5–5.1)
SODIUM: 138 mmol/L (ref 135–145)

## 2015-07-01 LAB — GLUCOSE, CAPILLARY
GLUCOSE-CAPILLARY: 309 mg/dL — AB (ref 65–99)
GLUCOSE-CAPILLARY: 330 mg/dL — AB (ref 65–99)
GLUCOSE-CAPILLARY: 122 mg/dL — AB (ref 65–99)
GLUCOSE-CAPILLARY: 317 mg/dL — AB (ref 65–99)
Glucose-Capillary: 140 mg/dL — ABNORMAL HIGH (ref 65–99)

## 2015-07-01 LAB — PROTIME-INR
INR: 0.93
PROTHROMBIN TIME: 12.7 s (ref 11.4–15.0)

## 2015-07-01 LAB — T4, FREE: Free T4: 0.87 ng/dL (ref 0.61–1.12)

## 2015-07-01 SURGERY — INSERTION, CARDIAC PACEMAKER
Anesthesia: Monitor Anesthesia Care | Site: Chest | Laterality: Left | Wound class: Clean

## 2015-07-01 MED ORDER — SODIUM CHLORIDE 0.9 % IV SOLN
1000.0000 mg | INTRAVENOUS | Status: DC | PRN
  Administered 2015-07-01: 1000 mg via INTRAVENOUS

## 2015-07-01 MED ORDER — GENTAMICIN SULFATE 40 MG/ML IJ SOLN
INTRAMUSCULAR | Status: AC
  Filled 2015-07-01: qty 2

## 2015-07-01 MED ORDER — SODIUM CHLORIDE 0.9 % IV SOLN
INTRAVENOUS | Status: DC
  Administered 2015-07-01: 50 mL/h via INTRAVENOUS
  Administered 2015-07-01 – 2015-07-03 (×3): via INTRAVENOUS

## 2015-07-01 MED ORDER — ONDANSETRON HCL 4 MG/2ML IJ SOLN: 4.0000 mg | Freq: Four times a day (QID) | INTRAMUSCULAR | Status: DC | PRN

## 2015-07-01 MED ORDER — MIDAZOLAM HCL 5 MG/5ML IJ SOLN
INTRAMUSCULAR | Status: DC | PRN
  Administered 2015-07-01 (×2): 1 mg via INTRAVENOUS

## 2015-07-01 MED ORDER — FENTANYL CITRATE (PF) 100 MCG/2ML IJ SOLN
INTRAMUSCULAR | Status: DC | PRN
  Administered 2015-07-01 (×2): 50 ug via INTRAVENOUS

## 2015-07-01 MED ORDER — LIDOCAINE HCL (CARDIAC) 20 MG/ML IV SOLN
INTRAVENOUS | Status: DC | PRN
  Administered 2015-07-01: 40 mg via INTRAVENOUS

## 2015-07-01 MED ORDER — VANCOMYCIN HCL 10 G IV SOLR
1500.0000 mg | INTRAVENOUS | Status: AC
  Filled 2015-07-01: qty 1500

## 2015-07-01 MED ORDER — CHLORHEXIDINE GLUCONATE 4 % EX LIQD: 60.0000 mL | Freq: Once | CUTANEOUS | Status: DC

## 2015-07-01 MED ORDER — LIDOCAINE HCL 2 % EX GEL
CUTANEOUS | Status: DC | PRN
  Administered 2015-07-01: 1 via TOPICAL

## 2015-07-01 MED ORDER — LIDOCAINE 1 % OPTIME INJ - NO CHARGE
INTRAMUSCULAR | Status: DC | PRN
  Administered 2015-07-01: 30 mL

## 2015-07-01 MED ORDER — EPHEDRINE SULFATE 50 MG/ML IJ SOLN
INTRAMUSCULAR | Status: DC | PRN
  Administered 2015-07-01: 10 mg via INTRAVENOUS

## 2015-07-01 MED ORDER — SODIUM CHLORIDE 0.9 % IJ SOLN
INTRAMUSCULAR | Status: AC
  Filled 2015-07-01: qty 50

## 2015-07-01 MED ORDER — VANCOMYCIN HCL IN DEXTROSE 1-5 GM/200ML-% IV SOLN
1000.0000 mg | Freq: Two times a day (BID) | INTRAVENOUS | Status: AC
  Administered 2015-07-01: 1000 mg via INTRAVENOUS
  Filled 2015-07-01: qty 200

## 2015-07-01 MED ORDER — SODIUM CHLORIDE 0.9 % IR SOLN
80.0000 mg | Status: AC
  Filled 2015-07-01: qty 2

## 2015-07-01 MED ORDER — PROPOFOL 500 MG/50ML IV EMUL
INTRAVENOUS | Status: DC | PRN
  Administered 2015-07-01: 80 ug/kg/min via INTRAVENOUS

## 2015-07-01 MED ORDER — FENTANYL CITRATE (PF) 100 MCG/2ML IJ SOLN
INTRAMUSCULAR | Status: AC
  Administered 2015-07-01: 25 ug via INTRAVENOUS
  Filled 2015-07-01: qty 2

## 2015-07-01 MED ORDER — DILTIAZEM HCL 30 MG PO TABS
30.0000 mg | ORAL_TABLET | Freq: Four times a day (QID) | ORAL | Status: DC
Start: 2015-07-01 — End: 2015-07-02
  Administered 2015-07-01 (×2): 30 mg via ORAL
  Filled 2015-07-01 (×3): qty 1

## 2015-07-01 MED ORDER — FENTANYL CITRATE (PF) 100 MCG/2ML IJ SOLN
25.0000 ug | INTRAMUSCULAR | Status: DC | PRN
  Administered 2015-07-01 (×4): 25 ug via INTRAVENOUS

## 2015-07-01 SURGICAL SUPPLY — 31 items
BAG DECANTER FOR FLEXI CONT (MISCELLANEOUS) ×2 IMPLANT
BRUSH SCRUB 4% CHG (MISCELLANEOUS) ×2 IMPLANT
CABLE SURG 12 DISP A/V CHANNEL (MISCELLANEOUS) ×2 IMPLANT
CANISTER SUCT 1200ML W/VALVE (MISCELLANEOUS) ×2 IMPLANT
CHLORAPREP W/TINT 26ML (MISCELLANEOUS) ×2 IMPLANT
COVER LIGHT HANDLE STERIS (MISCELLANEOUS) ×4 IMPLANT
COVER MAYO STAND STRL (DRAPES) ×2 IMPLANT
DRAPE C-ARM XRAY 36X54 (DRAPES) ×2 IMPLANT
DRESSING TELFA 4X3 1S ST N-ADH (GAUZE/BANDAGES/DRESSINGS) ×2 IMPLANT
DRSG TEGADERM 4X4.75 (GAUZE/BANDAGES/DRESSINGS) ×2 IMPLANT
GLOVE BIO SURGEON STRL SZ7.5 (GLOVE) ×2 IMPLANT
GLOVE BIO SURGEON STRL SZ8 (GLOVE) ×2 IMPLANT
GOWN STRL REUS W/ TWL LRG LVL3 (GOWN DISPOSABLE) ×1 IMPLANT
GOWN STRL REUS W/ TWL XL LVL3 (GOWN DISPOSABLE) ×1 IMPLANT
GOWN STRL REUS W/TWL LRG LVL3 (GOWN DISPOSABLE) ×1
GOWN STRL REUS W/TWL XL LVL3 (GOWN DISPOSABLE) ×1
IMMOBILIZER SHDR MD LX WHT (SOFTGOODS) IMPLANT
IMMOBILIZER SHDR XL LX WHT (SOFTGOODS) IMPLANT
INTRO PACEMKR SHEATH II 7FR (MISCELLANEOUS) ×2
INTRODUCER PACEMKR SHTH II 7FR (MISCELLANEOUS) ×1 IMPLANT
IV NS 500ML (IV SOLUTION) ×1
IV NS 500ML BAXH (IV SOLUTION) ×1 IMPLANT
KIT RM TURNOVER STRD PROC AR (KITS) ×2 IMPLANT
LABEL OR SOLS (LABEL) ×2 IMPLANT
LEAD CAPSURE NOVUS 5076-52CM (Lead) ×2 IMPLANT
MARKER SKIN W/RULER 31145785 (MISCELLANEOUS) ×2 IMPLANT
PACK PACE INSERTION (MISCELLANEOUS) ×2 IMPLANT
PAD GROUND ADULT SPLIT (MISCELLANEOUS) ×2 IMPLANT
PAD STATPAD (MISCELLANEOUS) ×2 IMPLANT
PPMADVISA SR MRI A3SR01 (Pacemaker) ×2 IMPLANT
SUT SILK 0 SH 30 (SUTURE) ×6 IMPLANT

## 2015-07-01 NOTE — Transfer of Care (Signed)
Immediate Anesthesia Transfer of Care Note  Patient: Deborah Obrien  Procedure(s) Performed: Procedure(s): INSERTION PACEMAKER (Left)  Patient Location: PACU  Anesthesia Type:General  Level of Consciousness: awake, oriented and patient cooperative  Airway & Oxygen Therapy: Patient Spontanous Breathing, Patient connected to nasal cannula oxygen, Patient connected to face mask oxygen and Patient connected to face mask  Post-op Assessment: Report given to RN and Post -op Vital signs reviewed and stable  Post vital signs: Reviewed and stable  Last Vitals:  Filed Vitals:   07/01/15 1349  BP: 121/72  Pulse: 109  Temp: 36.2 C  Resp: 14    Complications: No apparent anesthesia complications

## 2015-07-01 NOTE — Progress Notes (Signed)
[facility] [place] NAME: Deborah Obrien    MR#:  [id number]  DATE OF BIRTH:  03/06/1975  SUBJECTIVE:  CHIEF COMPLAINT:   Chief Complaint  Patient presents with  . Atrial Fibrillation    Had palpitation before coming, feels much better now.  seen in post op recovery room- s/p PPM placement.  REVIEW OF SYSTEMS:  CONSTITUTIONAL: No fever, fatigue or weakness.  EYES: No blurred or double vision.  EARS, NOSE, AND THROAT: No tinnitus or ear pain.  RESPIRATORY: No cough, shortness of breath, wheezing or hemoptysis.  CARDIOVASCULAR: No chest pain, orthopnea, edema. Had palpitation. GASTROINTESTINAL: No nausea, vomiting, diarrhea or abdominal pain.  GENITOURINARY: No dysuria, hematuria.  ENDOCRINE: No polyuria, nocturia,  HEMATOLOGY: No anemia, easy bruising or bleeding SKIN: No rash or lesion. MUSCULOSKELETAL: No joint pain or arthritis.   NEUROLOGIC: No tingling, numbness, weakness.  PSYCHIATRY: No anxiety or depression.   ROS  DRUG ALLERGIES:   Allergies  Allergen Reactions  . Cephalosporins Other (See Comments)    Reaction:  Unknown   . Hydrocodone-Acetaminophen Hives  . Macrolides And Ketolides Other (See Comments)    Reaction:  Unknown   . Meperidine Shortness Of Breath, Nausea Only and Other (See Comments)    Reaction:  Stomach pain   . Shellfish Allergy Shortness Of Breath, Diarrhea, Nausea Only, Rash and Other (See Comments)    Reaction:  Stomach pain   . Sulfa Antibiotics Shortness Of Breath, Diarrhea, Nausea Only and Other (See Comments)    Reaction:  Stomach pain   . Aspirin Other (See Comments)    Reaction:  Stomach pain   . Celecoxib Other (See Comments)    Reaction:  GI bleed, weakness, and stomach pain.    . Cephalexin Diarrhea, Nausea Only and Other (See Comments)    Reaction:  Stomach pain   . Erythromycin Diarrhea, Nausea Only and Other (See Comments)    Reaction:  Stomach pain   . Hydromorphone  Other (See Comments)    Reaction:  Hypotension   . Iodinated Diagnostic Agents Rash and Other (See Comments)    Pt states that she is unable to have because she has chronic kidney disease.    Marland Kitchen Oxycodone Other (See Comments)    Reaction:  Stomach pain   . Atorvastatin Nausea Only and Other (See Comments)    Reaction:  Weakness   . Codeine Diarrhea, Nausea Only and Other (See Comments)    Reaction:  Stomach pain   . Prednisone Other (See Comments)    Reaction:  Increases pts blood sugar  Pt states that she is allergic to all steroids.    . Tape Other (See Comments)    Reaction:  Causes pts skin to tear  Pt states that she is able to use paper tape.    . Valdecoxib Swelling and Other (See Comments)    Pt states that her hands and feet swell.    . Iodine Rash    VITALS:  Blood pressure 112/65, pulse 105, temperature 97.9 F (36.6 C), temperature source Oral, resp. rate 16, height 5\' 4"  (1.626 m), weight 116.756 kg (257 lb 6.4 oz), SpO2 94 %.  PHYSICAL EXAMINATION:  GENERAL:  74 y.o.-year-old patient lying in the bed with no acute distress.  EYES: Pupils equal, round, reactive to light and accommodation. No scleral icterus. Extraocular muscles intact.  HEENT: Head atraumatic, normocephalic. Oropharynx and nasopharynx clear.  NECK:  Supple, no jugular venous distention. No thyroid  enlargement, no tenderness.  LUNGS: Normal breath sounds bilaterally, no wheezing, rales,rhonchi or crepitation. No use of accessory muscles of respiration. Left upper chest pacemaker present, dressing present. CARDIOVASCULAR: S1, S2 irregular, rate around 80. No murmurs, rubs, or gallops.  ABDOMEN: Soft, nontender, nondistended. Bowel sounds present. No organomegaly or mass.  EXTREMITIES: No pedal edema, cyanosis, or clubbing.  NEUROLOGIC: Cranial nerves II through XII are intact. Muscle strength 5/5 in all extremities. Sensation intact. Gait not checked.  PSYCHIATRIC: The patient is alert and oriented x 3.   SKIN: No obvious rash, lesion, or ulcer.   Physical Exam LABORATORY PANEL:   CBC  Recent Labs Lab 07/01/15 0535  WBC 4.2  HGB 10.4*  HCT 33.6*  PLT 212   ------------------------------------------------------------------------------------------------------------------  Chemistries   Recent Labs Lab 06/29/15 1254 07/01/15 0535  NA 136 138  K 4.0 4.9  CL 104 101  CO2 24 30  GLUCOSE 230* 332*  BUN 33* 34*  CREATININE 1.67* 1.92*  CALCIUM 9.0 8.9  MG 1.6*  --   AST 34  --   ALT 20  --   ALKPHOS 98  --   BILITOT 1.2  --    ------------------------------------------------------------------------------------------------------------------  Cardiac Enzymes  Recent Labs Lab 06/29/15 1254  TROPONINI <0.03   ------------------------------------------------------------------------------------------------------------------  RADIOLOGY:  Dg Chest Port 1 View  07/01/2015  CLINICAL DATA:  Post cardiac pacemaker placement EXAM: PORTABLE CHEST 1 VIEW COMPARISON:  06/29/2015 FINDINGS: Left single lead pacer is in place with the tip in the right atrium. Cardiomegaly. No pneumothorax. Bibasilar atelectasis. No effusions or edema. No acute bony abnormality. IMPRESSION: Bibasilar atelectasis.  No pneumothorax. Electronically Signed   By: Rolm Baptise M.D.   On: 07/01/2015 14:15   Dg C-arm 1-60 Min-no Report  07/01/2015  CLINICAL DATA: surgery C-ARM 1-60 MINUTES Fluoroscopy was utilized by the requesting physician.  No radiographic interpretation.    ASSESSMENT AND PLAN:   Active Problems:   A-fib ([place])   1. Rapid A. fib with RVR  Heart rate was in the 120s. Admited to ICU with IV Cardizem drip Continue by mouth amiodarone and metoprolol Cardiology consultation with Dr. Wanda Plump ATH   hold off on anticoagulation as she had GI bleed in the past  Continue aspirin  Now off of cardizem drip HR is controlled.   Cardiologist suggested to add oral cardizem for better rate  control.  2. Type 2 diabetes  continue sliding scale insulin and her Levemir.  3. Hyperlipidemia on pravastatin  4. GERD Continue Protonix and sucralfate  5. DVT prophylaxis Subcutaneous Lovenox    All the records are reviewed and case discussed with Care Management/Social Workerr. Management plans discussed with the patient, family and they are in agreement.  CODE STATUS: Full  TOTAL TIME TAKING CARE OF THIS PATIENT: 30 minutes.   As now HR is stable, will transfer and monitor on telemetry floor.  POSSIBLE D/C IN 1-2 DAYS, DEPENDING ON CLINICAL CONDITION.   Vaughan Basta M.D on 07/01/2015   Between 7am to 6pm - Pager - [phone]  After 6pm go to [email or web address] - password [email or web address] [facility]  Office  [phone]  CC: Primary care physician; [name]., MD  Note: This dictation was prepared with Dragon dictation along with smaller phrase technology. Any transcriptional errors that result from this process are unintentional.

## 2015-07-01 NOTE — Progress Notes (Signed)
NURSING PROGRESS NOTE  AYDAN LABO [id number] Transfer Data: 07/01/2015 4:53 PM Attending Provider: Vaughan Basta, MD TA:[id number]., MD Code Status: FULL  Deborah Obrien is a 74 y.o. female patient transferred from PACU -No acute distress noted.  -No complaints of shortness of breath.  -No complaints of chest pain.   Cardiac Monitoring: Box # 22 in place. Cardiac monitor yields: ST   Blood pressure 136/85, pulse 111, temperature 97.8 F (36.6 C), temperature source Oral, resp. rate 18, height 5\' 4"  (1.626 m), weight 116.756 kg (257 lb 6.4 oz), SpO2 98 %.   IV Fluids:  IV in place, occlusive dsg intact without redness, NS running at 50 ml/hr   Allergies:  Cephalosporins; Hydrocodone-acetaminophen; Macrolides and ketolides; Meperidine; Shellfish allergy; Sulfa antibiotics; Aspirin; Celecoxib; Cephalexin; Erythromycin; Hydromorphone; Iodinated diagnostic agents; Oxycodone; Atorvastatin; Codeine; Prednisone; Tape; Valdecoxib; and Iodine  Past Medical History:   has a past medical history of Diabetes mellitus without complication ([place]); Hypertension; Morbid obesity ([place]); Chronic kidney disease (CKD) stage G3a/A1, moderately decreased glomerular filtration rate (GFR) between 45-59 mL/min/1.73 square meter and albuminuria creatinine ratio less than 30 mg/g; and Paroxysmal atrial fibrillation ([place]).  Past Surgical History:   has past surgical history that includes Shoulder arthroscopy with subacromial decompression (Left); Esophagogastroduodenoscopy (N/A, 01/27/2015); Colon surgery; and Pacemaker insertion (Left, 07/01/2015).  Social History:   reports that she has never smoked. She does not have any smokeless tobacco history on file. She reports that she does not drink alcohol.  Skin: dressing intact. Patient has an abrasion on right arm.   Patient/Family orientated to room. Information packet given to patient/family. Admission inpatient armband information  verified with patient/family to include name and date of birth and placed on patient arm. Side rails up x 2, fall assessment and education completed with patient/family. Patient/family able to verbalize understanding of risk associated with falls and verbalized understanding to call for assistance before getting out of bed. Call light within reach. Patient/family able to voice and demonstrate understanding of unit orientation instructions.    Will continue to evaluate and treat per MD orders.

## 2015-07-01 NOTE — Care Management Note (Signed)
Case Management Note  Patient Details  Name: Deborah Obrien MRN: [id number] Date of Birth: 1941-04-29  Subjective/Objective:   Admitted with acute on chronic atrial fibrillation. Plan is pacemaker today. Met with patient and her husband at bedside. Prior to admission patient lived at home and was independent with adls. She used a cane as needed and uses a Rolator when she goes out.  PCP is Dr. Ouida Sills. Cardiologist is at [facility]. Denies issues obtaining medications, copays or transportation.  No needs identified at this time. Will follow progression following surgery.                Action/Plan:   Expected Discharge Date:                  Expected Discharge Plan:  Home/Self Care  In-House Referral:     Discharge planning Services  CM Consult  Post Acute Care Choice:    Choice offered to:     DME Arranged:    DME Agency:     HH Arranged:    HH Agency:     Status of Service:  In process, will continue to follow  Medicare Important Message Given:    Date Medicare IM Given:    Medicare IM give by:    Date Additional Medicare IM Given:    Additional Medicare Important Message give by:     If discussed at [place] of Stay Meetings, dates discussed:    Additional Comments:  Jolly Mango, RN 07/01/2015, 9:07 AM

## 2015-07-01 NOTE — Progress Notes (Signed)
S/P ppm today. WIll add diltiazem 30 mg po qid since pacer is placed and continue with amiodarone and metoprolol. Not anticoagulation candidate due to bleeding history.

## 2015-07-01 NOTE — Progress Notes (Signed)
Pt sleeping  Waiting to transfer to room when ready

## 2015-07-01 NOTE — Progress Notes (Signed)
Arm immobilizer on

## 2015-07-01 NOTE — Op Note (Signed)
[facility]   06/29/2015 - 07/01/2015                     1:46 PM  PATIENT:  Deborah Obrien    PRE-OPERATIVE DIAGNOSIS:  tachy-brady syndrome  POST-OPERATIVE DIAGNOSIS:  Same  PROCEDURE:  INSERTION PACEMAKER  SURGEON:  Reno Clasby, MD    ANESTHESIA:     PREOPERATIVE INDICATIONS:  Deborah Obrien is a  74 y.o. female with a diagnosis of tachy-brady syndrome who failed conservative measures and elected for surgical management.    The risks benefits and alternatives were discussed with the patient preoperatively including but not limited to the risks of infection, bleeding, cardiopulmonary complications, the need for revision surgery, among others, and the patient was willing to proceed.   OPERATIVE PROCEDURE: The patient was brought to the operating room in a fasting state. Anesthesia was obtained with 1% lidocaine locally. A 6 cm incision was preformed over the left pectoral region. The pacemaker pocket was generated by electrocautery and blunt dissection. Access was obtained to left subclavia vein by fine needle aspiration. MRI compatible lead was positioned into right ventricular apical septum under fluoroscopic guidance. The lead was connected to a MRI compatible single chamber rate responsive generator and positioned into the pocket. The pocket was closed with 2-0 and 4-0 Vicryl. Steri-strips were applied.

## 2015-07-01 NOTE — Anesthesia Preprocedure Evaluation (Signed)
Anesthesia Evaluation  Patient identified by MRN, date of birth, ID band Patient awake    Reviewed: Allergy & Precautions, H&P , NPO status , Patient's Chart, lab work & pertinent test results  History of Anesthesia Complications Negative for: history of anesthetic complications  Airway Mallampati: III  TM Distance: >3 FB Neck ROM: limited    Dental  (+) Poor Dentition, Missing, Chipped, Edentulous Upper, Upper Dentures   Pulmonary neg pulmonary ROS, neg shortness of breath,    Pulmonary exam normal breath sounds clear to auscultation       Cardiovascular Exercise Tolerance: Poor hypertension, (-) angina+ DOE  (-) Past MI Normal cardiovascular exam Rhythm:regular Rate:Normal     Neuro/Psych negative neurological ROS  negative psych ROS   GI/Hepatic negative GI ROS, Neg liver ROS,   Endo/Other  diabetes, Poorly Controlled, Type 2, Insulin Dependent  Renal/GU ESRFRenal disease  negative genitourinary   Musculoskeletal   Abdominal   Peds  Hematology negative hematology ROS (+)   Anesthesia Other Findings Past Medical History:   Diabetes mellitus without complication (HCC)                 Hypertension                                                 Morbid obesity (HCC)                                         Chronic kidney disease (CKD) stage G3a/A1, mod*              Paroxysmal atrial fibrillation (HCC)                        Past Surgical History:   SHOULDER ARTHROSCOPY WITH SUBACROMIAL DECOMPRE* Left              ESOPHAGOGASTRODUODENOSCOPY                      N/A 01/27/2015      Comment:Procedure: ESOPHAGOGASTRODUODENOSCOPY (EGD);                Surgeon: Lollie Sails, MD;  Location: [facility]              ENDOSCOPY;  Service: Endoscopy;  Laterality:               N/A;   COLON SURGERY                                                BMI    Body Mass Index   44.63 kg/m 2    Symptomatic bradycardia   Reproductive/Obstetrics negative OB ROS                             Anesthesia Physical Anesthesia Plan  ASA: IV  Anesthesia Plan: General and MAC   Post-op Pain Management:    Induction:   Airway Management Planned:   Additional Equipment:   Intra-op Plan:   Post-operative Plan:   Informed Consent: I have reviewed  the patients History and Physical, chart, labs and discussed the procedure including the risks, benefits and alternatives for the proposed anesthesia with the patient or authorized representative who has indicated his/her understanding and acceptance.   Dental Advisory Given  Plan Discussed with: Anesthesiologist, CRNA and Surgeon  Anesthesia Plan Comments:         Anesthesia Quick Evaluation

## 2015-07-01 NOTE — Anesthesia Postprocedure Evaluation (Signed)
  Anesthesia Post-op Note  Patient: Deborah Obrien  Procedure(s) Performed: Procedure(s): INSERTION PACEMAKER (Left)  Anesthesia type:General, MAC  Patient location: PACU  Post pain: Pain level controlled  Post assessment: Post-op Vital signs reviewed, Patient's Cardiovascular Status Stable, Respiratory Function Stable, Patent Airway and No signs of Nausea or vomiting  Post vital signs: Reviewed and stable  Last Vitals:  Filed Vitals:   07/01/15 2010  BP: 112/65  Pulse: 105  Temp: 36.6 C  Resp: 16    Level of consciousness: awake, alert  and patient cooperative  Complications: No apparent anesthesia complications

## 2015-07-02 LAB — GLUCOSE, CAPILLARY
GLUCOSE-CAPILLARY: 118 mg/dL — AB (ref 65–99)
GLUCOSE-CAPILLARY: 312 mg/dL — AB (ref 65–99)
Glucose-Capillary: 123 mg/dL — ABNORMAL HIGH (ref 65–99)
Glucose-Capillary: 259 mg/dL — ABNORMAL HIGH (ref 65–99)
Glucose-Capillary: 265 mg/dL — ABNORMAL HIGH (ref 65–99)

## 2015-07-02 LAB — T3, FREE: T3 FREE: 2.1 pg/mL (ref 2.0–4.4)

## 2015-07-02 MED ORDER — SODIUM CHLORIDE 0.9 % IV BOLUS (SEPSIS)
500.0000 mL | Freq: Once | INTRAVENOUS | Status: AC
Start: 2015-07-02 — End: 2015-07-02
  Administered 2015-07-02: 500 mL via INTRAVENOUS

## 2015-07-02 MED ORDER — DILTIAZEM HCL ER COATED BEADS 180 MG PO CP24: 180.0000 mg | ORAL_CAPSULE | Freq: Every day | ORAL | Status: DC

## 2015-07-02 NOTE — Care Management Important Message (Signed)
Important Message  Patient Details  Name: SHAWNYA WICKEL MRN: [id number] Date of Birth: [birth year]   Medicare Important Message Given:  Yes-second notification given    Juliann Pulse A Allmond 07/02/2015, 10:08 AM

## 2015-07-02 NOTE — Progress Notes (Signed)
Patient BP 105/51 and HR 60. MD notified and received orders to hold 0600 dose of Diltiazem. Nursing staff will continue to monitor. Earleen Reaper, RN

## 2015-07-02 NOTE — Progress Notes (Signed)
Dr. Anselm Jungling paged and updated with most recent blood pressure. MD acknowledged and reports pt will not discharge today; no further orders, will continue to monitor.

## 2015-07-02 NOTE — Progress Notes (Signed)
Pt still had NPO orders from pacemaker placement, MD paged, previous diet continued. Orders placed. No further concerns. Will continue to monitor. Conley Simmonds, RN

## 2015-07-02 NOTE — Progress Notes (Signed)
[facility]  SUBJECTIVE: feels better . Heart rate controlled better. Pacer site without hematoma    Filed Vitals:   07/01/15 1600 07/01/15 2010 07/01/15 2331 07/02/15 0515  BP: 136/85 112/65 105/51 105/51  Pulse: 111 105 103 60  Temp:  97.9 F (36.6 C)  97.6 F (36.4 C)  TempSrc:  Oral  Oral  Resp:  16  16  Height:      Weight:      SpO2: 98% 94%  93%    Intake/Output Summary (Last 24 hours) at 07/02/15 0844 Last data filed at 07/02/15 0631  Gross per 24 hour  Intake   1635 ml  Output   1425 ml  Net    210 ml    LABS: Basic Metabolic Panel:  Recent Labs  06/29/15 1254 07/01/15 0535  NA 136 138  K 4.0 4.9  CL 104 101  CO2 24 30  GLUCOSE 230* 332*  BUN 33* 34*  CREATININE 1.67* 1.92*  CALCIUM 9.0 8.9  MG 1.6*  --    Liver Function Tests:  Recent Labs  06/29/15 1254  AST 34  ALT 20  ALKPHOS 98  BILITOT 1.2  PROT 7.7  ALBUMIN 4.1   No results for input(s): LIPASE, AMYLASE in the last 72 hours. CBC:  Recent Labs  06/29/15 1254 07/01/15 0535  WBC 6.0 4.2  NEUTROABS 3.8  --   HGB 11.9* 10.4*  HCT 38.0 33.6*  MCV 84.1 84.5  PLT 248 212   Cardiac Enzymes:  Recent Labs  06/29/15 1254  TROPONINI <0.03   BNP: Invalid input(s): POCBNP D-Dimer: No results for input(s): DDIMER in the last 72 hours. Hemoglobin A1C: No results for input(s): HGBA1C in the last 72 hours. Fasting Lipid Panel: No results for input(s): CHOL, HDL, LDLCALC, TRIG, CHOLHDL, LDLDIRECT in the last 72 hours. Thyroid Function Tests:  Recent Labs  06/30/15 1256 07/01/15 0535  TSH 5.094*  --   T3FREE  --  2.1   Anemia Panel: No results for input(s): VITAMINB12, FOLATE, FERRITIN, TIBC, IRON, RETICCTPCT in the last 72 hours.   Physical Exam: Blood pressure 105/51, pulse 60, temperature 97.6 F (36.4 C), temperature source Oral, resp. rate 16, height 5\' 4"  (1.626 m), weight 116.756 kg (257 lb 6.4 oz), SpO2 93 %.   General  appearance: alert and cooperative Head: Normocephalic, without obvious abnormality, atraumatic Resp: clear to auscultation bilaterally Cardio: irregularly irregular rhythm GI: soft, non-tender; bowel sounds normal; no masses,  no organomegaly Extremities: extremities normal, atraumatic, no cyanosis or edema Neurologic: Grossly normal  TELEMETRY: Reviewed telemetry pt in afib/flutter with controlled vr:  ASSESSMENT AND PLAN:  Active Problems:   A-fib ([facility]CC)-remains in chronic afib. Not antiocoagulation candidate due to complications with this in the past. Pacer site looks good. Would ambulate and discharge to day on amiodarone 200 mg daily; metoprolol 12.5 mg bid; and will change diltiazem to 180 mg xr daily    Deborah Obrien., MD, [facility] 07/02/2015 8:44 AM

## 2015-07-02 NOTE — Progress Notes (Addendum)
Dr. Anselm Jungling paged regarding AM BP:  Filed Vitals:   07/02/15 0900  BP: 77/54  Pulse: 111  Temp:   Resp: 18   MD acknowledged and asked RN to hold AM BP medications and increase IV fluids to 75cc/hr. No further orders. Pt a&o, no acute distress.   Family asking to speak with dr. Ubaldo Glassing, RN unable to answer medication questions to husbands satisfaction. Dr Ubaldo Glassing paged, made aware of BP and will round on patient and husband again. MD to put in orders.

## 2015-07-02 NOTE — Progress Notes (Signed)
Patient ambulated outside of room approx. 73ft with walker and RN accompanying. Pt tolerated well, no complaints of dizziness or shortness of breath, but does report feeling weaker than normal.

## 2015-07-02 NOTE — Progress Notes (Signed)
Patient has rested quietly today. Complaints of pain acknowledged and treated; no signs of discomfort or distress noted. Arm immobilizer in place. Nursing staff will continue to monitor. Earleen Reaper, RN

## 2015-07-02 NOTE — Progress Notes (Signed)
[place] [facility] [place] NAME: Deborah Obrien    MR#:  [id number]  DATE OF BIRTH:  27-Jul-1941  SUBJECTIVE:  CHIEF COMPLAINT:   Chief Complaint  Patient presents with  . Atrial Fibrillation    Had palpitation before coming, feels much better now.  seen in post op recovery room- s/p PPM placement.   Had hypotension today morning- responded to fluid bolus, no symptoms.  REVIEW OF SYSTEMS:  CONSTITUTIONAL: No fever, fatigue or weakness.  EYES: No blurred or double vision.  EARS, NOSE, AND THROAT: No tinnitus or ear pain.  RESPIRATORY: No cough, shortness of breath, wheezing or hemoptysis.  CARDIOVASCULAR: No chest pain, orthopnea, edema. Had palpitation. GASTROINTESTINAL: No nausea, vomiting, diarrhea or abdominal pain.  GENITOURINARY: No dysuria, hematuria.  ENDOCRINE: No polyuria, nocturia,  HEMATOLOGY: No anemia, easy bruising or bleeding SKIN: No rash or lesion. MUSCULOSKELETAL: No joint pain or arthritis.   NEUROLOGIC: No tingling, numbness, weakness.  PSYCHIATRY: No anxiety or depression.   ROS  DRUG ALLERGIES:   Allergies  Allergen Reactions  . Cephalosporins Other (See Comments)    Reaction:  Unknown   . Hydrocodone-Acetaminophen Hives  . Macrolides And Ketolides Other (See Comments)    Reaction:  Unknown   . Meperidine Shortness Of Breath, Nausea Only and Other (See Comments)    Reaction:  Stomach pain   . Shellfish Allergy Shortness Of Breath, Diarrhea, Nausea Only, Rash and Other (See Comments)    Reaction:  Stomach pain   . Sulfa Antibiotics Shortness Of Breath, Diarrhea, Nausea Only and Other (See Comments)    Reaction:  Stomach pain   . Aspirin Other (See Comments)    Reaction:  Stomach pain   . Celecoxib Other (See Comments)    Reaction:  GI bleed, weakness, and stomach pain.    . Cephalexin Diarrhea, Nausea Only and Other (See Comments)    Reaction:  Stomach pain   . Erythromycin Diarrhea, Nausea Only  and Other (See Comments)    Reaction:  Stomach pain   . Hydromorphone Other (See Comments)    Reaction:  Hypotension   . Iodinated Diagnostic Agents Rash and Other (See Comments)    Pt states that she is unable to have because she has chronic kidney disease.    Marland Kitchen Oxycodone Other (See Comments)    Reaction:  Stomach pain   . Atorvastatin Nausea Only and Other (See Comments)    Reaction:  Weakness   . Codeine Diarrhea, Nausea Only and Other (See Comments)    Reaction:  Stomach pain   . Prednisone Other (See Comments)    Reaction:  Increases pts blood sugar  Pt states that she is allergic to all steroids.    . Tape Other (See Comments)    Reaction:  Causes pts skin to tear  Pt states that she is able to use paper tape.    . Valdecoxib Swelling and Other (See Comments)    Pt states that her hands and feet swell.    . Iodine Rash    VITALS:  Blood pressure 103/66, pulse 103, temperature 98.3 F (36.8 C), temperature source Oral, resp. rate 16, height 5\' 4"  (1.626 m), weight 116.756 kg (257 lb 6.4 oz), SpO2 95 %.  PHYSICAL EXAMINATION:  GENERAL:  74 y.o.-year-old patient lying in the bed with no acute distress.  EYES: Pupils equal, round, reactive to light and accommodation. No scleral icterus. Extraocular muscles intact.  HEENT: Head atraumatic, normocephalic. Oropharynx and  nasopharynx clear.  NECK:  Supple, no jugular venous distention. No thyroid enlargement, no tenderness.  LUNGS: Normal breath sounds bilaterally, no wheezing, rales,rhonchi or crepitation. No use of accessory muscles of respiration. Left upper chest pacemaker present, dressing present. CARDIOVASCULAR: S1, S2 irregular, rate around 80. No murmurs, rubs, or gallops.  ABDOMEN: Soft, nontender, nondistended. Bowel sounds present. No organomegaly or mass.  EXTREMITIES: No pedal edema, cyanosis, or clubbing.  NEUROLOGIC: Cranial nerves II through XII are intact. Muscle strength 5/5 in all extremities. Sensation intact.  Gait not checked.  PSYCHIATRIC: The patient is alert and oriented x 3.  SKIN: No obvious rash, lesion, or ulcer.   Physical Exam LABORATORY PANEL:   CBC  Recent Labs Lab 07/01/15 0535  WBC 4.2  HGB 10.4*  HCT 33.6*  PLT 212   ------------------------------------------------------------------------------------------------------------------  Chemistries   Recent Labs Lab 06/29/15 1254 07/01/15 0535  NA 136 138  K 4.0 4.9  CL 104 101  CO2 24 30  GLUCOSE 230* 332*  BUN 33* 34*  CREATININE 1.67* 1.92*  CALCIUM 9.0 8.9  MG 1.6*  --   AST 34  --   ALT 20  --   ALKPHOS 98  --   BILITOT 1.2  --    ------------------------------------------------------------------------------------------------------------------  Cardiac Enzymes  Recent Labs Lab 06/29/15 1254  TROPONINI <0.03   ------------------------------------------------------------------------------------------------------------------  RADIOLOGY:  Dg Chest Port 1 View  07/01/2015  CLINICAL DATA:  Post cardiac pacemaker placement EXAM: PORTABLE CHEST 1 VIEW COMPARISON:  06/29/2015 FINDINGS: Left single lead pacer is in place with the tip in the right atrium. Cardiomegaly. No pneumothorax. Bibasilar atelectasis. No effusions or edema. No acute bony abnormality. IMPRESSION: Bibasilar atelectasis.  No pneumothorax. Electronically Signed   By: Rolm Baptise M.D.   On: 07/01/2015 14:15   Dg C-arm 1-60 Min-no Report  07/01/2015  CLINICAL DATA: surgery C-ARM 1-60 MINUTES Fluoroscopy was utilized by the requesting physician.  No radiographic interpretation.    ASSESSMENT AND PLAN:   Active Problems:   A-fib (HCC)   * Rapid A. fib with RVR  Heart rate was in the 120s. Admited to ICU with IV Cardizem drip Continue by mouth amiodarone and metoprolol Cardiology consultation with Dr. Wanda Plump ATH   hold off on anticoagulation as she had GI bleed in the past  Continue aspirin  Now off of cardizem drip HR is controlled.    Cardiologist suggested to add oral cardizem for better rate control- post PPM placement.   As had hypotension 07/02/15- held meds.  * hypotension    Itrogenic due to meds.    Held for now, Dr. Ubaldo Glassing to adjust doses- spoke to him.  * Type 2 diabetes  continue sliding scale insulin and her Levemir.  * Hyperlipidemia on pravastatin  * GERD Continue Protonix and sucralfate  * DVT prophylaxis Subcutaneous Lovenox    All the records are reviewed and case discussed with Care Management/Social Workerr. Management plans discussed with the patient, family and they are in agreement.  CODE STATUS: Full  TOTAL TIME TAKING CARE OF THIS PATIENT: 30 minutes.  POSSIBLE D/C IN 1-2 DAYS, DEPENDING ON CLINICAL CONDITION. Her husband present in room- discussed with him.  Vaughan Basta M.D on 07/02/2015   Between 7am to 6pm - Pager - [phone]  After 6pm go to [email or web address] - password [email or web address] [facility]  Office  [phone]  CC: Primary care physician; [name]., MD  Note: This dictation was prepared with Dragon dictation along with smaller phrase  technology. Any transcriptional errors that result from this process are unintentional.

## 2015-07-03 LAB — GLUCOSE, CAPILLARY
Glucose-Capillary: 95 mg/dL (ref 65–99)
Glucose-Capillary: 191 mg/dL — ABNORMAL HIGH (ref 65–99)

## 2015-07-03 MED ORDER — AMIODARONE HCL 200 MG PO TABS
400.0000 mg | ORAL_TABLET | Freq: Every day | ORAL | Status: DC
  Administered 2015-07-03: 400 mg via ORAL
  Filled 2015-07-03: qty 2

## 2015-07-03 MED ORDER — AMIODARONE HCL 400 MG PO TABS
400.0000 mg | ORAL_TABLET | Freq: Every day | ORAL | Status: DC
Start: 2015-07-03 — End: 2016-01-25

## 2015-07-03 MED ORDER — FUROSEMIDE 20 MG PO TABS: 20.0000 mg | ORAL_TABLET | Freq: Every day | ORAL | Status: DC | PRN

## 2015-07-03 NOTE — Care Management (Signed)
No discharge needs identified 

## 2015-07-03 NOTE — Progress Notes (Signed)
Deborah Obrien  SUBJECTIVE: Feels good. No dizziness. Blood pressure is better   Filed Vitals:   07/02/15 2044 07/03/15 0003 07/03/15 0541 07/03/15 0849  BP: 125/99 121/49 98/55 97/66   Pulse: 110 104 109 112  Temp:   97.9 F (36.6 C)   TempSrc:   Oral Oral  Resp: 16  16 18   Height:      Weight:      SpO2:   97%     Intake/Output Summary (Last 24 hours) at 07/03/15 0905 Last data filed at 07/03/15 0738  Gross per 24 hour  Intake 2573.33 ml  Output   2400 ml  Net 173.33 ml    LABS: Basic Metabolic Panel:  Recent Labs  07/01/15 0535  NA 138  K 4.9  CL 101  CO2 30  GLUCOSE 332*  BUN 34*  CREATININE 1.92*  CALCIUM 8.9   Liver Function Tests: No results for input(s): AST, ALT, ALKPHOS, BILITOT, PROT, ALBUMIN in the last 72 hours. No results for input(s): LIPASE, AMYLASE in the last 72 hours. CBC:  Recent Labs  07/01/15 0535  WBC 4.2  HGB 10.4*  HCT 33.6*  MCV 84.5  PLT 212   Cardiac Enzymes: No results for input(s): CKTOTAL, CKMB, CKMBINDEX, TROPONINI in the last 72 hours. BNP: Invalid input(s): POCBNP D-Dimer: No results for input(s): DDIMER in the last 72 hours. Hemoglobin A1C: No results for input(s): HGBA1C in the last 72 hours. Fasting Lipid Panel: No results for input(s): CHOL, HDL, LDLCALC, TRIG, CHOLHDL, LDLDIRECT in the last 72 hours. Thyroid Function Tests:  Recent Labs  06/30/15 1256 07/01/15 0535  TSH 5.094*  --   T3FREE  --  2.1   Anemia Panel: No results for input(s): VITAMINB12, FOLATE, FERRITIN, TIBC, IRON, RETICCTPCT in the last 72 hours.   Physical Exam: Blood pressure 97/66, pulse 112, temperature 97.9 F (36.6 C), temperature source Oral, resp. rate 18, height 5\' 4"  (1.626 m), weight 116.756 kg (257 lb 6.4 oz), SpO2 97 %.    General appearance: alert and cooperative Resp: clear to auscultation bilaterally Cardio: irregularly irregular rhythm GI: soft, non-tender; bowel sounds normal;  no masses,  no organomegaly Neurologic: Grossly normal  TELEMETRY: Reviewed telemetry pt in afib with controlled vr:  ASSESSMENT AND PLAN:  Active Problems:   A-fib (HCC)-afib with good rate control. Not on anticoagulation due to bleeding. Will increase amiodarone to 400 mg daily; continue with metoprolol at 12.5 bid. Will discontinue cardizem for now due to relative hypotension. OK for discharge with follow up with Dr. Deniece Ree., MD, [facility] 07/03/2015 9:05 AM

## 2015-07-03 NOTE — Progress Notes (Signed)
Inpatient Diabetes Program Recommendations  AACE/ADA: New Consensus Statement on Inpatient Glycemic Control (2015)  Target Ranges:  Prepandial:   less than 140 mg/dL      Peak postprandial:   less than 180 mg/dL (1-2 hours)      Critically ill patients:  140 - 180 mg/dL   Review of Glycemic Control  Diabetes history: Type 2 Outpatient Diabetes medications: Novolog 15-35 units tid with meals, Levemir 40 units qhs via insulin vial and syringe.  Spoke with patient and her husband at the bedside.  They have hit the donut hole and are [facility]  She is rotating sites and gives her insulin as ordered. She has taken insulin for many years.  She has plenty of insulin at home and will resume these same doses on discharge.   Gentry Fitz, RN, BA, MHA, CDE Diabetes Coordinator Inpatient Diabetes Program  [phone] (Team Pager) [phone] ([place]) 07/03/2015 11:10 AM

## 2015-07-03 NOTE — Discharge Summary (Signed)
[facility] at [place] NAME: Deborah Obrien    MR#:  [id number]  DATE OF BIRTH:  [birth year]  DATE OF ADMISSION:  06/29/2015 ADMITTING PHYSICIAN: Fritzi Mandes, MD  DATE OF DISCHARGE: 07/03/2015  PRIMARY CARE PHYSICIAN: [name]., MD    ADMISSION DIAGNOSIS:  Paroxysmal atrial fibrillation ([place]) [I48.0]  DISCHARGE DIAGNOSIS:  Active Problems:   A-fib (HCC)   S/p PPM.  SECONDARY DIAGNOSIS:   Past Medical History  Diagnosis Date  . Diabetes mellitus without complication ([place])   . Hypertension   . Morbid obesity ([place])   . Chronic kidney disease (CKD) stage G3a/A1, moderately decreased glomerular filtration rate (GFR) between 45-59 mL/min/1.73 square meter and albuminuria creatinine ratio less than 30 mg/g   . Paroxysmal atrial fibrillation [facility])     HOSPITAL COURSE:   * Rapid A. fib with RVR Heart rate was in the 120s. Admited to ICU with IV Cardizem drip Continue by mouth amiodarone and metoprolol Cardiology consultation with Dr. Wanda Plump ATH  hold off on anticoagulation as she had GI bleed in the past  Continue aspirin Now off of cardizem drip HR is controlled.  Cardiologist suggested to add oral cardizem for better rate control- post PPM placement.  As had hypotension 07/02/15- held meds.   readdrerss meds by Dr. Ubaldo Glassing- Suggest amio 400 mg daily, and Metoprolol 12.5 BID.  * hypotension  Itrogenic due to meds.  Held for now, Dr. Ubaldo Glassing to adjust doses- spoke to him.    Re-assessed and changed dose as above.  * Type 2 diabetes  continue sliding scale insulin and her Levemir.  * Hyperlipidemia on pravastatin  * GERD Continue Protonix and sucralfate  * DVT prophylaxis Subcutaneous Lovenox   DISCHARGE CONDITIONS:   Stable.  CONSULTS OBTAINED:  Treatment Team:  Teodoro Spray, MD Corey Skains, MD  DRUG ALLERGIES:   Allergies  Allergen Reactions  . Cephalosporins Other (See Comments)     Reaction:  Unknown   . Hydrocodone-Acetaminophen Hives  . Macrolides And Ketolides Other (See Comments)    Reaction:  Unknown   . Meperidine Shortness Of Breath, Nausea Only and Other (See Comments)    Reaction:  Stomach pain   . Shellfish Allergy Shortness Of Breath, Diarrhea, Nausea Only, Rash and Other (See Comments)    Reaction:  Stomach pain   . Sulfa Antibiotics Shortness Of Breath, Diarrhea, Nausea Only and Other (See Comments)    Reaction:  Stomach pain   . Aspirin Other (See Comments)    Reaction:  Stomach pain   . Celecoxib Other (See Comments)    Reaction:  GI bleed, weakness, and stomach pain.    . Cephalexin Diarrhea, Nausea Only and Other (See Comments)    Reaction:  Stomach pain   . Erythromycin Diarrhea, Nausea Only and Other (See Comments)    Reaction:  Stomach pain   . Hydromorphone Other (See Comments)    Reaction:  Hypotension   . Iodinated Diagnostic Agents Rash and Other (See Comments)    Pt states that she is unable to have because she has chronic kidney disease.    Marland Kitchen Oxycodone Other (See Comments)    Reaction:  Stomach pain   . Atorvastatin Nausea Only and Other (See Comments)    Reaction:  Weakness   . Codeine Diarrhea, Nausea Only and Other (See Comments)    Reaction:  Stomach pain   . Prednisone Other (See Comments)    Reaction:  Increases pts blood  sugar  Pt states that she is allergic to all steroids.    . Tape Other (See Comments)    Reaction:  Causes pts skin to tear  Pt states that she is able to use paper tape.    . Valdecoxib Swelling and Other (See Comments)    Pt states that her hands and feet swell.    . Iodine Rash    DISCHARGE MEDICATIONS:   Current Discharge Medication List    CONTINUE these medications which have CHANGED   Details  amiodarone (PACERONE) 400 MG tablet Take 1 tablet (400 mg total) by mouth daily. Qty: 30 tablet, Refills: 0    furosemide (LASIX) 20 MG tablet Take 1 tablet (20 mg total) by mouth daily as needed.  If gain in weight >4 Lb than baseline, or swelling on legs or ankles. If you need to take it for 2 days in a raw and no change happens- call cardiology office. Qty: 30 tablet, Refills: 0      CONTINUE these medications which have NOT CHANGED   Details  APPLE CIDER VINEGAR PO Take 1 tablet by mouth 2 (two) times daily.    aspirin EC 81 MG tablet Take 81 mg by mouth at bedtime.    Calcium Carbonate-Vitamin D (CALCIUM 600+D) 600-400 MG-UNIT tablet Take 1 tablet by mouth 2 (two) times daily.    Cholecalciferol (VITAMIN D3) 400 UNITS tablet Take 400-800 Units by mouth 2 (two) times daily. Pt takes two tablets in the morning and one tablet at bedtime.    CINNAMON PO Take 2 tablets by mouth 2 (two) times daily.    Cranberry-Vitamin C (AZO CRANBERRY URINARY TRACT) 250-60 MG CAPS Take 2 capsules by mouth 2 (two) times daily.    docusate sodium (COLACE) 100 MG capsule Take 200 mg by mouth at bedtime.    hydrocortisone 2.5 % cream Apply 1 application topically at bedtime as needed (for rash under breasts).     insulin aspart (NOVOLOG) 100 UNIT/ML injection Inject 15-35 Units into the skin 3 (three) times daily before meals. Pt uses as needed per sliding scale:    0-150:  15 units  151-200:  17 units  201-250:  19 units  251-300:  23 units  301-350:  27 units  351-400:  31 units  401 and greater:  35 units    insulin detemir (LEVEMIR) 100 UNIT/ML injection Inject 40 Units into the skin at bedtime.    ketoconazole (NIZORAL) 2 % cream Apply 1 application topically every 2 (two) hours as needed (for rash under breasts).     metoprolol tartrate (LOPRESSOR) 25 MG tablet Take 12.5 tablets by mouth 2 (two) times daily.     Multiple Vitamin (MULTIVITAMIN WITH MINERALS) TABS tablet Take 1 tablet by mouth daily.    oxybutynin (DITROPAN-XL) 10 MG 24 hr tablet Take 10 mg by mouth at bedtime.    pantoprazole (PROTONIX) 40 MG tablet Take 40 mg by mouth 2 (two) times daily.    PARoxetine (PAXIL) 20  MG tablet Take 20 mg by mouth daily.    pravastatin (PRAVACHOL) 20 MG tablet Take 1 tablet by mouth at bedtime.     sucralfate (CARAFATE) 1 G tablet Take 1 tablet by mouth 4 (four) times daily -  before meals and at bedtime.    talc (ZEASORB) powder Apply 1 application topically as needed (for rash under breasts).         DISCHARGE INSTRUCTIONS:    Follow with cardiologist in office. Councelled about  fluid restriction and daily weights.  If you experience worsening of your admission symptoms, develop shortness of breath, life threatening emergency, suicidal or homicidal thoughts you must seek medical attention immediately by calling 911 or calling your MD immediately  if symptoms less severe.  You Must read complete instructions/literature along with all the possible adverse reactions/side effects for all the Medicines you take and that have been prescribed to you. Take any new Medicines after you have completely understood and accept all the possible adverse reactions/side effects.   Please note  You were cared for by a hospitalist during your hospital stay. If you have any questions about your discharge medications or the care you received while you were in the hospital after you are discharged, you can call the unit and asked to speak with the hospitalist on call if the hospitalist that took care of you is not available. Once you are discharged, your primary care physician will handle any further medical issues. Please note that NO REFILLS for any discharge medications will be authorized once you are discharged, as it is imperative that you return to your primary care physician (or establish a relationship with a primary care physician if you do not have one) for your aftercare needs so that they can reassess your need for medications and monitor your lab values.    Today   CHIEF COMPLAINT:   Chief Complaint  Patient presents with  . Atrial Fibrillation    HISTORY OF PRESENT  ILLNESS:  Briza Fludd  is a 74 y.o. female with a known history of chronic paroxysmal A. fib not on any anticoagulation due to GI bleed in the past, polypharmacy allergy, chronic kidney disease stage III, morbid obesity, hypertension, type 2 diabetes comes to the emergency room after she was seen and [facility] [facility] due to ongoing palpitations since yesterday. Patient was recently seen by cardiology Dr. Nehemiah Massed on 06/25/2015 and her metoprolol and amiodarone dosage were adjusted. The patient continued to feel poorly for the last couple days more so since yesterday was seen at clinic clinic was found to have heart rate in the 120s she was found to be in rapid A. fib came to the emergency room is now on IV Cardizem drip after 10 mg of IV Cardizem was given to her. She denies any chest pressure or chest pain or shortness of breath. She is being admitted for further nausea management.  Patient had undergone cardioversion about a year ago for the same problem   VITAL SIGNS:  Blood pressure 118/66, pulse 113, temperature 97.9 F (36.6 C), temperature source Oral, resp. rate 18, height 5\' 4"  (1.626 m), weight 116.756 kg (257 lb 6.4 oz), SpO2 97 %.  I/O:   Intake/Output Summary (Last 24 hours) at 07/03/15 1030 Last data filed at 07/03/15 0924  Gross per 24 hour  Intake 2391.25 ml  Output   2400 ml  Net  -8.75 ml    PHYSICAL EXAMINATION:   GENERAL: 74 y.o.-year-old patient lying in the bed with no acute distress.  EYES: Pupils equal, round, reactive to light and accommodation. No scleral icterus. Extraocular muscles intact.  HEENT: Head atraumatic, normocephalic. Oropharynx and nasopharynx clear.  NECK: Supple, no jugular venous distention. No thyroid enlargement, no tenderness.  LUNGS: Normal breath sounds bilaterally, no wheezing, rales,rhonchi or crepitation. No use of accessory muscles of respiration. Left upper chest pacemaker present, dressing present. CARDIOVASCULAR: S1, S2  irregular, rate around 80. No murmurs, rubs, or gallops.  ABDOMEN: Soft, nontender, nondistended. Bowel  sounds present. No organomegaly or mass.  EXTREMITIES: No pedal edema, cyanosis, or clubbing.  NEUROLOGIC: Cranial nerves II through XII are intact. Muscle strength 5/5 in all extremities. Sensation intact. Gait not checked.  PSYCHIATRIC: The patient is alert and oriented x 3.  SKIN: No obvious rash, lesion, or ulcer.    DATA REVIEW:   CBC  Recent Labs Lab 07/01/15 0535  WBC 4.2  HGB 10.4*  HCT 33.6*  PLT 212    Chemistries   Recent Labs Lab 06/29/15 1254 07/01/15 0535  NA 136 138  K 4.0 4.9  CL 104 101  CO2 24 30  GLUCOSE 230* 332*  BUN 33* 34*  CREATININE 1.67* 1.92*  CALCIUM 9.0 8.9  MG 1.6*  --   AST 34  --   ALT 20  --   ALKPHOS 98  --   BILITOT 1.2  --     Cardiac Enzymes  Recent Labs Lab 06/29/15 1254  TROPONINI <0.03    Microbiology Results  Results for orders placed or performed during the hospital encounter of 06/29/15  MRSA PCR Screening     Status: None   Collection Time: 06/29/15 12:18 PM  Result Value Ref Range Status   MRSA by PCR NEGATIVE NEGATIVE Final    Comment:        The GeneXpert MRSA Assay (FDA approved for NASAL specimens only), is one component of a comprehensive MRSA colonization surveillance program. It is not intended to diagnose MRSA infection nor to guide or monitor treatment for MRSA infections.     RADIOLOGY:  Dg Chest Port 1 View  07/01/2015  CLINICAL DATA:  Post cardiac pacemaker placement EXAM: PORTABLE CHEST 1 VIEW COMPARISON:  06/29/2015 FINDINGS: Left single lead pacer is in place with the tip in the right atrium. Cardiomegaly. No pneumothorax. Bibasilar atelectasis. No effusions or edema. No acute bony abnormality. IMPRESSION: Bibasilar atelectasis.  No pneumothorax. Electronically Signed   By: Rolm Baptise M.D.   On: 07/01/2015 14:15   Dg C-arm 1-60 Min-no Report  07/01/2015  CLINICAL DATA:  surgery C-ARM 1-60 MINUTES Fluoroscopy was utilized by the requesting physician.  No radiographic interpretation.     Management plans discussed with the patient, family and they are in agreement.  CODE STATUS:     Code Status Orders        Start     Ordered   07/01/15 1606  Full code   Continuous     07/01/15 1605      TOTAL TIME TAKING CARE OF THIS PATIENT: 35 minutes.    Vaughan Basta M.D on 07/03/2015 at 10:30 AM  Between 7am to 6pm - Pager - [phone]  After 6pm go to [email or web address] - password [email or web address] [facility]  Office  [phone]  CC: Primary care physician; [name]., MD   Note: This dictation was prepared with Dragon dictation along with smaller phrase technology. Any transcriptional errors that result from this process are unintentional.

## 2015-07-03 NOTE — Progress Notes (Signed)
Patient discharged, incision site is clean, intact, steri strips in places. Patient discharged home via wheelchair with husband.

## 2015-07-03 NOTE — Discharge Instructions (Addendum)
Fluid restriction up to 1500 ml daily Low salt diet. Daily weigh your self, if > 2-3 Lb gain in a day or > 5 Lb in 1 week- take lasix 20 mg once a day for 2 days- and still not able to get rid of extra weight/ leg swelling- call your doctor or cardiologist.  Pacemaker Implantation The heart has its own electrical system, or natural pacemaker, to regulate the heartbeat. Sometimes, the natural pacemaker system of the heart fails and causes the heart to beat too slowly. If this happens, a pacemaker can be surgically placed to help the heart beat at a normal or programmed rate. A pacemaker is a small, battery-powered device that is placed under the skin and is programmed to sense your heartbeats. If your heart rate is lower than the programmed rate, the pacemaker will pace your heart. Parts of a pacemaker include:  Wires or leads. The leads are placed in the heart and transmit electricity to the heart. The leads are connected to the pulse generator.  Pulse generator. The pulse generator contains a computer and a memory system. The pulse generator also produces the electrical signal that triggers the heart to beat. A pacemaker may be placed if:  You have a slow heartbeat (bradycardia).  You have fainting (syncope).  Shortness of breath (dyspnea) due to heart problems. LET [facility] CARE PROVIDER KNOW ABOUT:  Any allergies you may have.  All medicines you are taking, including vitamins, herbs, eye drops, creams, and over-the-counter medicines.  Previous problems you or members of your family have had with the use of anesthetics.  Any blood disorders you have.  Previous surgeries you have had.  Medical conditions you have.  Possibility of pregnancy, if this applies. RISKS AND COMPLICATIONS Generally, pacemaker implantation is a safe procedure. However, problems can occur and include:  Bleeding.  Unable to place the pacemaker under local sedation.  Infection. BEFORE THE  PROCEDURE  You will have blood work drawn before the procedure.  Do not use any tobacco products including cigarettes, chewing tobacco, or electronic cigarettes. If you need help quitting, ask your health care provider.  Do not eat or drink anything after midnight on the night before the procedure or as directed by your health care provider.  Ask your health care provider about:  Changing or stopping your regular medicines. This is especially important if you are taking diabetes medicines or blood thinners.  Taking medicines such as aspirin and ibuprofen. These medicines can thin your blood. Do not take these medicines before your procedure if your health care provider asks you not to.  Ask your health care provider if you can take a sip of water with any approved medicines the morning of the procedure. PROCEDURE  The surgery to place a pacemaker is considered a minimally invasive surgical procedure. It is done under a local anesthetic, which is an injection at the incision site that makes the skin numb. You are also given sedation and pain medicine that makes you drowsy during the procedure.   An intravenous line (IV) will be started in your hand or arm so sedation and pain medicine can be given during the pacemaker procedure.  A numbing medicine will be injected into the skin where the pacemaker is to be placed. A small incision will then be made into the skin. The pacemaker is usually placed under the skin near the collarbone.  After the incision has been made, the leads will be inserted into a large vein and  guided into the heart using X-ray.  Using the same incision that was used to place the leads, a small pocket will be created under the skin to hold the pulse generator. The leads will then be connected to the pulse generator.  The incision site will then be closed. A bandage (dressing) is placed over the pacemaker site. The dressing is removed 24-48 hours afterward. AFTER THE  PROCEDURE  You will be taken to a recovery area after the pacemaker implant. Your vital signs such as blood pressure, heart rate, breathing, and oxygen levels will be monitored.  A chest X-ray will be done after the pacemaker has been implanted. This is to make sure the pacemaker and leads are in the correct place.   This information is not intended to replace advice given to you by your health care provider. Make sure you discuss any questions you have with your health care provider.   Document Released: 08/19/2002 Document Revised: 09/19/2014 Document Reviewed: 01/03/2012 Elsevier Interactive Patient Education 2016 Elsevier Inc.  Pacemaker Implantation, Care After Refer to this sheet over the next few weeks. These instructions provide you with information on caring for yourself after the procedure. Your health care provider may also give you more specific instructions. Your treatment has been planned according to current medical practices, but problems sometimes occur. Call your health care provider if you have any problems or questions regarding your pacemaker.  WHAT TO EXPECT AFTER THE PROCEDURE  You may feel pain. Some pain is normal. It may last a few days.  A slight bump may be seen over the skin where the device was placed. Sometimes, it is possible to feel the device under the skin. This is normal.  In the months and years afterward, your health care provider will check the device, the leads, and the battery every few months. Eventually, when the battery is low, the device will be replaced. HOME CARE INSTRUCTIONS Medicines  Take medicines only as directed by your health care provider.  If you were prescribed an antibiotic medicine, finish it all even if you start to feel better.  Do not take any other medicines without asking your health care provider first. Some medicines, including certain painkillers, can cause bleeding in your stomach after surgery. Wound Care  Do not  remove the bandage on your chest until directed to do so by your health care provider.  After your bandage is removed, you may see pieces of tape called skin adhesive strips over the area where the cut was made (incision site). Let them fall off on their own.  Check the incision site every day to make sure it is not infected, bleeding, or starting to pull apart.  Do not use lotions or ointments near the incision site unless directed to do so.  Keep the incision area clean and dry for 2-3 days after the procedure or as directed by your health care provider. It takes several weeks for the incision site to completely heal.  Do not take baths, swim, or use a hot tub until your health care provider approves. Activities  Try to walk a little every day. Exercising is important after this procedure. It is also important to use your shoulder on the side of the pacemaker in daily tasks that do not require exaggerated motion.  Avoid sudden jerking, pulling, or chopping movements that pull your upper arm far away from your body for at least 6 weeks.  Do not lift your upper arm above your shoulders for  at least 6 weeks. This means no tennis, golf, or swimming for this period of time. If you sleep with the arm above your head, use a restraint to prevent this from happening as you sleep.  You may go back to work when your health care provider says it is okay. Check with your health care provider before you start to drive or play sports. Other Instructions  Follow diet instructions if they were provided. You should be able to eat what you usually do right away, but you may need to limit your salt intake.  Weigh yourself every day. If you suddenly gain weight, fluid may be building up in your body.  Always carry your pacemaker identification card with you. The card should list the implant date, device model, and manufacturer. Consider wearing a medical alert bracelet or necklace.  Tell all health care  providers that you have a pacemaker. This may prevent them from giving you a magnetic resource imaging scan (MRI) because of the strong magnets used during that test.  If you must pass through a metal detector, quickly walk through it. Do not stop under the detector or stand near it.  Avoid places or objects with a strong electric or magnetic field, including:  Engineer, maintenance. When at the airport, let officials know you have a pacemaker. Your ID card will let you be checked in a way that is safe for you and that will not damage your pacemaker. Also, do not let a security person wave a magnetic wand near your pacemaker. That can make it stop working.  Power plants.  Large electrical generators.  Radiofrequency transmission towers, such as cell phone and radio towers.  Do not use amateur (ham) radio equipment or electric (arc) welding torches. Some devices are safe to use if held at least 1 foot from your pacemaker. These include power tools, lawn mowers, and speakers. If you are unsure of whether something is safe to use, ask your health care provider.  You may safely use electric blankets, heating pads, computers, and microwave ovens.  When using your cell phone, hold it to the ear opposite the pacemaker. Do not leave your cell phone in a pocket over the pacemaker.  Keep all follow-up visits as directed by your health care provider. This is how your health care provider makes sure your chest is healing the way it should. Ask your health care provider when you should come back to have your stitches or staples taken out.  Have your pacemaker checked every 3-6 months or as directed by your health care provider. Most pacemakers last for 4-8 years before a new one is needed. SEEK MEDICAL CARE IF:  You gain weight suddenly.  Your legs or feet swell more than they have before.  It feels like your heart is fluttering or skipping beats (heart palpitations).  You have a fever. SEEK  IMMEDIATE MEDICAL CARE IF:  You have chest pain.  You feel more short of breath than you have felt before.  You feel more light-headed than you have felt before.  You have problems with your incision site, such as swelling or bleeding, or it starts to open up.  You have drainage, redness, swelling, or pain at your incision site.   This information is not intended to replace advice given to you by your health care provider. Make sure you discuss any questions you have with your health care provider.   Document Released: 03/18/2005 Document Revised: 09/19/2014 Document Reviewed: 12/30/2011 Elsevier  Interactive Patient Education [employer].

## 2015-07-03 NOTE — Progress Notes (Signed)
Per Dr. Ubaldo Glassing and Dr. Anselm Jungling, [place] to give 12.5 metoprolol this AM, both MDs aware of patients BP.

## 2015-07-03 NOTE — Progress Notes (Signed)
PICC line removed per protocol, two RNs at bedside. Patient tolerated well, can sit up at 12:15pm.

## 2015-07-21 ENCOUNTER — Inpatient Hospital Stay
Admission: EM | Admit: 2015-07-21 | Discharge: 2015-07-25 | DRG: 872 | Disposition: A | Discharge status: Home Health | Payer: Medicare Other | Attending: Internal Medicine | Admitting: Internal Medicine

## 2015-07-21 ENCOUNTER — Emergency Department: Payer: Medicare Other

## 2015-07-21 DIAGNOSIS — A419 Sepsis, unspecified organism: Secondary | ICD-10-CM | POA: Diagnosis not present

## 2015-07-21 DIAGNOSIS — E872 Acidosis: Secondary | ICD-10-CM | POA: Diagnosis present

## 2015-07-21 DIAGNOSIS — K317 Polyp of stomach and duodenum: Secondary | ICD-10-CM | POA: Insufficient documentation

## 2015-07-21 DIAGNOSIS — N183 Chronic kidney disease, stage 3 (moderate): Secondary | ICD-10-CM | POA: Diagnosis present

## 2015-07-21 DIAGNOSIS — Z6841 Body Mass Index (BMI) 40.0 and over, adult: Secondary | ICD-10-CM

## 2015-07-21 DIAGNOSIS — F329 Major depressive disorder, single episode, unspecified: Secondary | ICD-10-CM | POA: Diagnosis present

## 2015-07-21 DIAGNOSIS — I482 Chronic atrial fibrillation: Secondary | ICD-10-CM | POA: Diagnosis present

## 2015-07-21 DIAGNOSIS — E1122 Type 2 diabetes mellitus with diabetic chronic kidney disease: Secondary | ICD-10-CM | POA: Diagnosis present

## 2015-07-21 DIAGNOSIS — Z950 Presence of cardiac pacemaker: Secondary | ICD-10-CM

## 2015-07-21 DIAGNOSIS — N3090 Cystitis, unspecified without hematuria: Secondary | ICD-10-CM

## 2015-07-21 DIAGNOSIS — K2970 Gastritis, unspecified, without bleeding: Secondary | ICD-10-CM | POA: Insufficient documentation

## 2015-07-21 DIAGNOSIS — R52 Pain, unspecified: Secondary | ICD-10-CM

## 2015-07-21 DIAGNOSIS — R079 Chest pain, unspecified: Secondary | ICD-10-CM

## 2015-07-21 DIAGNOSIS — R634 Abnormal weight loss: Secondary | ICD-10-CM | POA: Diagnosis present

## 2015-07-21 DIAGNOSIS — Z8249 Family history of ischemic heart disease and other diseases of the circulatory system: Secondary | ICD-10-CM

## 2015-07-21 DIAGNOSIS — I1310 Hypertensive heart and chronic kidney disease without heart failure, with stage 1 through stage 4 chronic kidney disease, or unspecified chronic kidney disease: Secondary | ICD-10-CM | POA: Diagnosis present

## 2015-07-21 DIAGNOSIS — R1013 Epigastric pain: Secondary | ICD-10-CM | POA: Insufficient documentation

## 2015-07-21 DIAGNOSIS — Z833 Family history of diabetes mellitus: Secondary | ICD-10-CM

## 2015-07-21 DIAGNOSIS — Z91013 Allergy to seafood: Secondary | ICD-10-CM

## 2015-07-21 DIAGNOSIS — K219 Gastro-esophageal reflux disease without esophagitis: Secondary | ICD-10-CM | POA: Insufficient documentation

## 2015-07-21 DIAGNOSIS — I959 Hypotension, unspecified: Secondary | ICD-10-CM | POA: Diagnosis present

## 2015-07-21 DIAGNOSIS — F419 Anxiety disorder, unspecified: Secondary | ICD-10-CM | POA: Diagnosis present

## 2015-07-21 DIAGNOSIS — N390 Urinary tract infection, site not specified: Secondary | ICD-10-CM | POA: Diagnosis present

## 2015-07-21 LAB — COMPREHENSIVE METABOLIC PANEL
ALBUMIN: 3.4 g/dL — AB (ref 3.5–5.0)
ALK PHOS: 121 U/L (ref 38–126)
ALT: 27 U/L (ref 14–54)
ANION GAP: 10 (ref 5–15)
AST: 45 U/L — ABNORMAL HIGH (ref 15–41)
BILIRUBIN TOTAL: 2.5 mg/dL — AB (ref 0.3–1.2)
BUN: 26 mg/dL — ABNORMAL HIGH (ref 6–20)
CALCIUM: 8.2 mg/dL — AB (ref 8.9–10.3)
CO2: 24 mmol/L (ref 22–32)
Chloride: 105 mmol/L (ref 101–111)
Creatinine, Ser: 1.55 mg/dL — ABNORMAL HIGH (ref 0.44–1.00)
GFR, EST AFRICAN AMERICAN: 37 mL/min — AB (ref >60)
GFR, EST NON AFRICAN AMERICAN: 32 mL/min — AB (ref >60)
Glucose, Bld: 151 mg/dL — ABNORMAL HIGH (ref 65–99)
Potassium: 3.8 mmol/L (ref 3.5–5.1)
SODIUM: 139 mmol/L (ref 135–145)
TOTAL PROTEIN: 6.5 g/dL (ref 6.5–8.1)

## 2015-07-21 LAB — URINALYSIS COMPLETE WITH MICROSCOPIC (ARMC ONLY)
BILIRUBIN URINE: NEGATIVE
GLUCOSE, UA: NEGATIVE mg/dL
HGB URINE DIPSTICK: NEGATIVE
KETONES UR: NEGATIVE mg/dL
LEUKOCYTES UA: NEGATIVE
NITRITE: NEGATIVE
PH: 5 (ref 5.0–8.0)
Protein, ur: NEGATIVE mg/dL
Specific Gravity, Urine: 1.005 (ref 1.005–1.030)

## 2015-07-21 LAB — TROPONIN I: Troponin I: <0.03 ng/mL (ref <0.031)

## 2015-07-21 LAB — CBC WITH DIFFERENTIAL/PLATELET
BASOS PCT: 1 %
Basophils Absolute: 0 10*3/uL (ref 0–0.1)
EOS ABS: 0 10*3/uL (ref 0–0.7)
Eosinophils Relative: 1 %
HCT: 34.2 % — ABNORMAL LOW (ref 35.0–47.0)
Hemoglobin: 11.2 g/dL — ABNORMAL LOW (ref 12.0–16.0)
Lymphocytes Relative: 8 %
Lymphs Abs: 0.6 10*3/uL — ABNORMAL LOW (ref 1.0–3.6)
MCH: 27.5 pg (ref 26.0–34.0)
MCHC: 32.6 g/dL (ref 32.0–36.0)
MCV: 84.1 fL (ref 80.0–100.0)
MONO ABS: 0.2 10*3/uL (ref 0.2–0.9)
MONOS PCT: 3 %
Neutro Abs: 6.4 10*3/uL (ref 1.4–6.5)
Neutrophils Relative %: 87 %
PLATELETS: 175 10*3/uL (ref 150–440)
RBC: 4.07 MIL/uL (ref 3.80–5.20)
RDW: 17.6 % — AB (ref 11.5–14.5)
WBC: 7.3 10*3/uL (ref 3.6–11.0)

## 2015-07-21 LAB — LIPASE, BLOOD: Lipase: 21 U/L (ref 11–51)

## 2015-07-21 LAB — LACTIC ACID, PLASMA: Lactic Acid, Venous: 2.2 mmol/L (ref 0.5–2.0)

## 2015-07-21 MED ORDER — PIPERACILLIN-TAZOBACTAM 3.375 G IVPB
3.3750 g | Freq: Once | INTRAVENOUS | Status: AC
  Administered 2015-07-21: 3.375 g via INTRAVENOUS
  Filled 2015-07-21: qty 50

## 2015-07-21 MED ORDER — SODIUM CHLORIDE 0.9 % IV SOLN
Freq: Once | INTRAVENOUS | Status: AC
  Administered 2015-07-21: 22:00:00 via INTRAVENOUS

## 2015-07-21 MED ORDER — SODIUM CHLORIDE 0.9 % IV SOLN
Freq: Once | INTRAVENOUS | Status: AC
  Administered 2015-07-22: via INTRAVENOUS

## 2015-07-21 MED ORDER — MORPHINE SULFATE (PF) 4 MG/ML IV SOLN
4.0000 mg | Freq: Once | INTRAVENOUS | Status: AC
  Administered 2015-07-21: 4 mg via INTRAVENOUS
  Filled 2015-07-21: qty 1

## 2015-07-21 MED ORDER — ONDANSETRON HCL 4 MG/2ML IJ SOLN
4.0000 mg | Freq: Once | INTRAMUSCULAR | Status: AC
  Administered 2015-07-21: 4 mg via INTRAVENOUS
  Filled 2015-07-21: qty 2

## 2015-07-21 MED ORDER — VANCOMYCIN HCL IN DEXTROSE 1-5 GM/200ML-% IV SOLN
1000.0000 mg | Freq: Once | INTRAVENOUS | Status: AC
  Administered 2015-07-21: 1000 mg via INTRAVENOUS
  Filled 2015-07-21: qty 200

## 2015-07-21 MED ORDER — SODIUM CHLORIDE 0.9 % IV SOLN
Freq: Once | INTRAVENOUS | Status: AC
  Administered 2015-07-21: 21:00:00 via INTRAVENOUS

## 2015-07-21 NOTE — ED Provider Notes (Signed)
[facility] Emergency Department Provider Note     Time seen: ----------------------------------------- 8:25 PM on 07/21/2015 -----------------------------------------    I have reviewed the triage vital signs and the nursing notes.   HISTORY  Chief Complaint Chest Pain and Fever    HPI Deborah Obrien is a 74 y.o. female who presents ER with abdominal pain, chest pain and nausea vomiting since this morning. According to EMS patient had a pacemaker proximal one month ago, she states the pacemaker site isn't unusually tender, she has started running a fever today. She's also been chest and abdominal pain.   Past Medical History  Diagnosis Date  . Diabetes mellitus without complication ([place])   . Hypertension   . Morbid obesity ([place])   . Chronic kidney disease (CKD) stage G3a/A1, moderately decreased glomerular filtration rate (GFR) between 45-59 mL/min/1.73 square meter and albuminuria creatinine ratio less than 30 mg/g   . Paroxysmal atrial fibrillation [facility])     Patient Active Problem List   Diagnosis Date Noted  . A-fib ([place]) 06/29/2015  . Intestinal obstruction ([place]) 02/20/2015    Past Surgical History  Procedure Laterality Date  . Shoulder arthroscopy with subacromial decompression Left   . Esophagogastroduodenoscopy N/A 01/27/2015    Procedure: ESOPHAGOGASTRODUODENOSCOPY (EGD);  Surgeon: Lollie Sails, MD;  Location: [facility] ENDOSCOPY;  Service: Endoscopy;  Laterality: N/A;  . Colon surgery    . 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/2015    Procedure: INSERTION PACEMAKER;  Surgeon: Isaias Cowman, MD;  Location: [facility] ORS;  Service: Cardiovascular;  Laterality: Left;    Allergies Cephalosporins; Hydrocodone-acetaminophen; Macrolides and ketolides; Meperidine; Shellfish allergy; Sulfa antibiotics; Aspirin; Celecoxib; Cephalexin; Erythromycin; Hydromorphone; Iodinated diagnostic agents; Oxycodone; Atorvastatin; Codeine; Prednisone; Tape;  Valdecoxib; and Iodine  Social History Social History  Substance Use Topics  . Smoking status: Never Smoker   . Smokeless tobacco: None  . Alcohol Use: No    Review of Systems Constitutional: Positive for fever. Eyes: Negative for visual changes. ENT: Negative for sore throat. Cardiovascular: Positive for chest pain Respiratory: Negative for shortness of breath. Gastrointestinal: Positive for abdominal pain and vomiting Genitourinary: Negative for dysuria. Musculoskeletal: Negative for back pain. Skin: Negative for rash. Neurological: Negative for headaches, focal weakness or numbness.  10-point ROS otherwise negative.  ____________________________________________   PHYSICAL EXAM:  VITAL SIGNS: ED Triage Vitals  Enc Vitals Group     BP 07/21/15 2018 113/47 mmHg     Pulse Rate 07/21/15 2018 113     Resp 07/21/15 2018 25     Temp 07/21/15 2018 103.1 F (39.5 C)     Temp src --      SpO2 --      Weight --      Height --      Head Cir --      Peak Flow --      Pain Score 07/21/15 2019 7     Pain Loc --      Pain Edu? --      Excl. in [place]? --     Constitutional: Alert and oriented. Morbidly obese, mild distress Eyes: Conjunctivae are normal. PERRL. Normal extraocular movements. ENT   Head: Normocephalic and atraumatic.   Nose: No congestion/rhinnorhea.   Mouth/Throat: Mucous membranes are moist.   Neck: No stridor. Cardiovascular: Rapid rate, regular rhythm. Normal and symmetric distal pulses are present in all extremities. No murmurs, rubs, or gallops. Respiratory: Normal respiratory effort without tachypnea nor retractions. Breath sounds are clear and equal bilaterally. No wheezes/rales/rhonchi. Gastrointestinal:  Epigastric tenderness, no rebound or guarding. Normal bowel sounds. Musculoskeletal: Nontender with normal range of motion in all extremities. No joint effusions.  No lower extremity tenderness nor edema. Neurologic:  Normal speech and  language. No gross focal neurologic deficits are appreciated. Speech is normal. No gait instability. Skin:  Skin is warm, dry and intact. No rash noted. Psychiatric: Mood and affect are normal. Speech and behavior are normal. Patient exhibits appropriate insight and judgment. ____________________________________________  EKG: Interpreted by me. A 2 flutter with 21 AV block, left axis deviation rate is 107 bpm, normal QT interval. Normal QRS width.  ____________________________________________  ED COURSE:  Pertinent labs & imaging results that were available during my care of the patient were reviewed by me and considered in my medical decision making (see chart for details). Unclear etiology of her symptoms. We'll check basic labs, obtain cultures. We'll give IV fluid bolus as well as IV antiemetics and 4 mg morphine. ____________________________________________    LABS (pertinent positives/negatives)  Labs Reviewed  CBC WITH DIFFERENTIAL/PLATELET - Abnormal; Notable for the following:    Hemoglobin 11.2 (*)    HCT 34.2 (*)    RDW 17.6 (*)    Lymphs Abs 0.6 (*)    All other components within normal limits  COMPREHENSIVE METABOLIC PANEL - Abnormal; Notable for the following:    Glucose, Bld 151 (*)    BUN 26 (*)    Creatinine, Ser 1.55 (*)    Calcium 8.2 (*)    Albumin 3.4 (*)    AST 45 (*)    Total Bilirubin 2.5 (*)    GFR calc non Af Amer 32 (*)    GFR calc Af Amer 37 (*)    All other components within normal limits  URINALYSIS COMPLETEWITH MICROSCOPIC ([facility] ONLY) - Abnormal; Notable for the following:    Color, Urine COLORLESS (*)    APPearance CLEAR (*)    Bacteria, UA MANY (*)    Squamous Epithelial / LPF 0-5 (*)    All other components within normal limits  LACTIC ACID, PLASMA - Abnormal; Notable for the following:    Lactic Acid, Venous 2.2 (*)    All other components within normal limits  CULTURE, BLOOD (ROUTINE X 2)  CULTURE, BLOOD (ROUTINE X 2)  URINE CULTURE   TROPONIN I  LIPASE, BLOOD  LACTIC ACID, PLASMA   CRITICAL CARE Performed by: Earleen Newport   Total critical care time: 30 minutes  Critical care time was exclusive of separately billable procedures and treating other patients.  Critical care was necessary to treat or prevent imminent or life-threatening deterioration.  Critical care was time spent personally by me on the following activities: development of treatment plan with patient and/or surrogate as well as nursing, discussions with consultants, evaluation of patient's response to treatment, examination of patient, obtaining history from patient or surrogate, ordering and performing treatments and interventions, ordering and review of laboratory studies, ordering and review of radiographic studies, pulse oximetry and re-evaluation of patient's condition.   RADIOLOGY Images were viewed by me  Chest x-ray  IMPRESSION: No acute pulmonary process. ____________________________________________  FINAL ASSESSMENT AND PLAN  Fever, urosepsis, chest pain  Plan: Patient with labs and imaging as dictated above. Patient likely with urosepsis. I have ordered the 30 cc/kg IV fluid bolus. She has mild lactic acidosis. She was given bank and Zosyn. Unclear etiology of her chest pain at this time, she will need an inpatient course and continued IV antibiotics at this point her  blood pressure has stabilized 104/54. Heart rate is 109 bpm.   Earleen Newport, MD   Earleen Newport, MD 07/21/15 [id number]

## 2015-07-21 NOTE — ED Notes (Signed)
Pt bib EMS w/ c/o ab pain, CP and n/v that began this AM.  Per EMS, pt had pacemaker place approx 1 month ago.  Per EMS, pt started having CP/ab pain.  Pt received 4 mg zofran IM by EMS.

## 2015-07-22 ENCOUNTER — Encounter: Payer: Self-pay | Admitting: Internal Medicine

## 2015-07-22 ENCOUNTER — Inpatient Hospital Stay: Payer: Medicare Other

## 2015-07-22 DIAGNOSIS — R1013 Epigastric pain: Secondary | ICD-10-CM | POA: Diagnosis not present

## 2015-07-22 DIAGNOSIS — K219 Gastro-esophageal reflux disease without esophagitis: Secondary | ICD-10-CM | POA: Diagnosis present

## 2015-07-22 DIAGNOSIS — N183 Chronic kidney disease, stage 3 (moderate): Secondary | ICD-10-CM | POA: Diagnosis present

## 2015-07-22 DIAGNOSIS — F419 Anxiety disorder, unspecified: Secondary | ICD-10-CM | POA: Diagnosis present

## 2015-07-22 DIAGNOSIS — N390 Urinary tract infection, site not specified: Secondary | ICD-10-CM | POA: Diagnosis present

## 2015-07-22 DIAGNOSIS — I482 Chronic atrial fibrillation: Secondary | ICD-10-CM | POA: Diagnosis present

## 2015-07-22 DIAGNOSIS — N3090 Cystitis, unspecified without hematuria: Secondary | ICD-10-CM | POA: Diagnosis present

## 2015-07-22 DIAGNOSIS — Z6841 Body Mass Index (BMI) 40.0 and over, adult: Secondary | ICD-10-CM | POA: Diagnosis not present

## 2015-07-22 DIAGNOSIS — I959 Hypotension, unspecified: Secondary | ICD-10-CM | POA: Diagnosis present

## 2015-07-22 DIAGNOSIS — Z833 Family history of diabetes mellitus: Secondary | ICD-10-CM | POA: Diagnosis not present

## 2015-07-22 DIAGNOSIS — E1122 Type 2 diabetes mellitus with diabetic chronic kidney disease: Secondary | ICD-10-CM | POA: Diagnosis present

## 2015-07-22 DIAGNOSIS — Z8249 Family history of ischemic heart disease and other diseases of the circulatory system: Secondary | ICD-10-CM | POA: Diagnosis not present

## 2015-07-22 DIAGNOSIS — Z950 Presence of cardiac pacemaker: Secondary | ICD-10-CM | POA: Diagnosis not present

## 2015-07-22 DIAGNOSIS — K2970 Gastritis, unspecified, without bleeding: Secondary | ICD-10-CM | POA: Diagnosis present

## 2015-07-22 DIAGNOSIS — F329 Major depressive disorder, single episode, unspecified: Secondary | ICD-10-CM | POA: Diagnosis present

## 2015-07-22 DIAGNOSIS — I1310 Hypertensive heart and chronic kidney disease without heart failure, with stage 1 through stage 4 chronic kidney disease, or unspecified chronic kidney disease: Secondary | ICD-10-CM | POA: Diagnosis present

## 2015-07-22 DIAGNOSIS — K317 Polyp of stomach and duodenum: Secondary | ICD-10-CM | POA: Diagnosis present

## 2015-07-22 DIAGNOSIS — R079 Chest pain, unspecified: Secondary | ICD-10-CM | POA: Diagnosis present

## 2015-07-22 DIAGNOSIS — A419 Sepsis, unspecified organism: Secondary | ICD-10-CM | POA: Diagnosis present

## 2015-07-22 DIAGNOSIS — Z91013 Allergy to seafood: Secondary | ICD-10-CM | POA: Diagnosis not present

## 2015-07-22 DIAGNOSIS — E872 Acidosis: Secondary | ICD-10-CM | POA: Diagnosis present

## 2015-07-22 DIAGNOSIS — R634 Abnormal weight loss: Secondary | ICD-10-CM | POA: Diagnosis present

## 2015-07-22 LAB — TROPONIN I
Troponin I: <0.03 ng/mL (ref <0.031)
Troponin I: <0.03 ng/mL (ref <0.031)

## 2015-07-22 LAB — HEMOGLOBIN A1C
HEMOGLOBIN A1C: 7 % — AB (ref 4.0–6.0)
Hgb A1c MFr Bld: 7.2 % — ABNORMAL HIGH (ref 4.0–6.0)

## 2015-07-22 LAB — GLUCOSE, CAPILLARY
Glucose-Capillary: 192 mg/dL — ABNORMAL HIGH (ref 65–99)
Glucose-Capillary: 263 mg/dL — ABNORMAL HIGH (ref 65–99)
Glucose-Capillary: 190 mg/dL — ABNORMAL HIGH (ref 65–99)
Glucose-Capillary: 175 mg/dL — ABNORMAL HIGH (ref 65–99)
Glucose-Capillary: 167 mg/dL — ABNORMAL HIGH (ref 65–99)

## 2015-07-22 LAB — LACTIC ACID, PLASMA: Lactic Acid, Venous: 0.9 mmol/L (ref 0.5–2.0)

## 2015-07-22 LAB — TSH: TSH: 3.492 u[IU]/mL (ref 0.350–4.500)

## 2015-07-22 MED ORDER — ENOXAPARIN SODIUM 40 MG/0.4ML ~~LOC~~ SOLN: 40.0000 mg | SUBCUTANEOUS | Status: DC

## 2015-07-22 MED ORDER — CRANBERRY-VITAMIN C 250-60 MG PO CAPS
2.0000 | ORAL_CAPSULE | Freq: Two times a day (BID) | ORAL | Status: DC
Start: 2015-07-22 — End: 2015-07-22

## 2015-07-22 MED ORDER — METOCLOPRAMIDE HCL 5 MG/ML IJ SOLN
5.0000 mg | Freq: Once | INTRAMUSCULAR | Status: AC
  Administered 2015-07-22: 5 mg via INTRAVENOUS
  Filled 2015-07-22: qty 2

## 2015-07-22 MED ORDER — PIPERACILLIN SOD-TAZOBACTAM SO 4.5 (4-0.5) G IV SOLR
4.5000 g | Freq: Three times a day (TID) | INTRAVENOUS | Status: DC
  Filled 2015-07-22 (×3): qty 4.5

## 2015-07-22 MED ORDER — OXYBUTYNIN CHLORIDE ER 10 MG PO TB24
10.0000 mg | ORAL_TABLET | Freq: Every day | ORAL | Status: DC
  Administered 2015-07-22 – 2015-07-24 (×3): 10 mg via ORAL
  Filled 2015-07-22 (×3): qty 1

## 2015-07-22 MED ORDER — HYDROCODONE-ACETAMINOPHEN 5-325 MG PO TABS
1.0000 | ORAL_TABLET | Freq: Four times a day (QID) | ORAL | Status: DC | PRN
  Administered 2015-07-22 – 2015-07-25 (×4): 1 via ORAL
  Filled 2015-07-22 (×4): qty 1

## 2015-07-22 MED ORDER — PIPERACILLIN SOD-TAZOBACTAM SO 40.5 (36-4.5) G IV SOLR: 4.5000 g | Freq: Three times a day (TID) | INTRAVENOUS | Status: DC

## 2015-07-22 MED ORDER — INSULIN DETEMIR 100 UNIT/ML ~~LOC~~ SOLN
25.0000 [IU] | Freq: Every day | SUBCUTANEOUS | Status: DC
  Administered 2015-07-22: 25 [IU] via SUBCUTANEOUS
  Filled 2015-07-22 (×2): qty 0.25

## 2015-07-22 MED ORDER — PROMETHAZINE HCL 25 MG/ML IJ SOLN
12.5000 mg | Freq: Four times a day (QID) | INTRAMUSCULAR | Status: DC | PRN
  Administered 2015-07-22 – 2015-07-23 (×2): 12.5 mg via INTRAVENOUS
  Filled 2015-07-22 (×2): qty 1

## 2015-07-22 MED ORDER — DOCUSATE SODIUM 100 MG PO CAPS
200.0000 mg | ORAL_CAPSULE | Freq: Every day | ORAL | Status: DC
  Administered 2015-07-22 – 2015-07-23 (×2): 200 mg via ORAL
  Filled 2015-07-22 (×2): qty 2

## 2015-07-22 MED ORDER — ENOXAPARIN SODIUM 40 MG/0.4ML ~~LOC~~ SOLN
40.0000 mg | Freq: Two times a day (BID) | SUBCUTANEOUS | Status: DC
  Administered 2015-07-22 – 2015-07-25 (×6): 40 mg via SUBCUTANEOUS
  Filled 2015-07-22 (×6): qty 0.4

## 2015-07-22 MED ORDER — FUROSEMIDE 40 MG PO TABS: 40.0000 mg | ORAL_TABLET | Freq: Every day | ORAL | Status: DC | PRN

## 2015-07-22 MED ORDER — ONDANSETRON HCL 4 MG PO TABS: 4.0000 mg | ORAL_TABLET | Freq: Four times a day (QID) | ORAL | Status: DC | PRN

## 2015-07-22 MED ORDER — PANTOPRAZOLE SODIUM 40 MG PO TBEC
40.0000 mg | DELAYED_RELEASE_TABLET | Freq: Two times a day (BID) | ORAL | Status: DC
  Administered 2015-07-22 – 2015-07-25 (×6): 40 mg via ORAL
  Filled 2015-07-22 (×8): qty 1

## 2015-07-22 MED ORDER — KETOCONAZOLE 2 % EX CREA
1.0000 "application " | TOPICAL_CREAM | CUTANEOUS | Status: DC | PRN
  Filled 2015-07-22: qty 15

## 2015-07-22 MED ORDER — INSULIN ASPART 100 UNIT/ML ~~LOC~~ SOLN
0.0000 [IU] | Freq: Three times a day (TID) | SUBCUTANEOUS | Status: DC
  Administered 2015-07-22 – 2015-07-23 (×5): 3 [IU] via SUBCUTANEOUS
  Filled 2015-07-22: qty 8
  Filled 2015-07-22 (×4): qty 3

## 2015-07-22 MED ORDER — PAROXETINE HCL 20 MG PO TABS
20.0000 mg | ORAL_TABLET | Freq: Every day | ORAL | Status: DC
  Administered 2015-07-22 – 2015-07-25 (×4): 20 mg via ORAL
  Filled 2015-07-22 (×4): qty 1

## 2015-07-22 MED ORDER — CALCIUM CARBONATE-VITAMIN D 500-200 MG-UNIT PO TABS
1.0000 | ORAL_TABLET | Freq: Two times a day (BID) | ORAL | Status: DC
  Administered 2015-07-22 – 2015-07-25 (×6): 1 via ORAL
  Filled 2015-07-22 (×6): qty 1

## 2015-07-22 MED ORDER — METOPROLOL TARTRATE 25 MG PO TABS
25.0000 mg | ORAL_TABLET | Freq: Two times a day (BID) | ORAL | Status: DC
  Administered 2015-07-22: 25 mg via ORAL
  Filled 2015-07-22 (×2): qty 1

## 2015-07-22 MED ORDER — SODIUM CHLORIDE 0.9 % IV SOLN
INTRAVENOUS | Status: DC
  Administered 2015-07-22 – 2015-07-24 (×6): via INTRAVENOUS

## 2015-07-22 MED ORDER — PRAVASTATIN SODIUM 20 MG PO TABS
20.0000 mg | ORAL_TABLET | Freq: Every day | ORAL | Status: DC
  Administered 2015-07-22 – 2015-07-24 (×3): 20 mg via ORAL
  Filled 2015-07-22 (×3): qty 1

## 2015-07-22 MED ORDER — VANCOMYCIN HCL IN DEXTROSE 1-5 GM/200ML-% IV SOLN
1000.0000 mg | INTRAVENOUS | Status: DC
  Administered 2015-07-22 – 2015-07-23 (×3): 1000 mg via INTRAVENOUS
  Filled 2015-07-22 (×5): qty 200

## 2015-07-22 MED ORDER — INSULIN DETEMIR 100 UNIT/ML ~~LOC~~ SOLN
30.0000 [IU] | Freq: Every day | SUBCUTANEOUS | Status: DC
  Administered 2015-07-22 – 2015-07-24 (×2): 30 [IU] via SUBCUTANEOUS
  Filled 2015-07-22 (×4): qty 0.3

## 2015-07-22 MED ORDER — PIPERACILLIN-TAZOBACTAM 4.5 G IVPB
4.5000 g | Freq: Three times a day (TID) | INTRAVENOUS | Status: DC
Start: 2015-07-22 — End: 2015-07-22
  Administered 2015-07-22: 4.5 g via INTRAVENOUS
  Filled 2015-07-22 (×3): qty 100

## 2015-07-22 MED ORDER — ADULT MULTIVITAMIN W/MINERALS CH
1.0000 | ORAL_TABLET | Freq: Every day | ORAL | Status: DC
  Administered 2015-07-22: 1 via ORAL
  Filled 2015-07-22: qty 1

## 2015-07-22 MED ORDER — HEPARIN SODIUM (PORCINE) 5000 UNIT/ML IJ SOLN
5000.0000 [IU] | Freq: Three times a day (TID) | INTRAMUSCULAR | Status: DC
  Administered 2015-07-22 (×2): 5000 [IU] via SUBCUTANEOUS
  Filled 2015-07-22 (×2): qty 1

## 2015-07-22 MED ORDER — PIPERACILLIN SOD-TAZOBACTAM SO 4.5 (4-0.5) G IV SOLR
4.5000 g | Freq: Three times a day (TID) | INTRAVENOUS | Status: DC
  Filled 2015-07-22 (×2): qty 4.5

## 2015-07-22 MED ORDER — HYDROCORTISONE 2.5 % EX CREA
1.0000 "application " | TOPICAL_CREAM | Freq: Every evening | CUTANEOUS | Status: DC | PRN
  Filled 2015-07-22: qty 28.35

## 2015-07-22 MED ORDER — ACETAMINOPHEN 325 MG PO TABS
650.0000 mg | ORAL_TABLET | Freq: Four times a day (QID) | ORAL | Status: DC | PRN
  Administered 2015-07-22 (×2): 650 mg via ORAL
  Filled 2015-07-22 (×2): qty 2

## 2015-07-22 MED ORDER — IOHEXOL 240 MG/ML SOLN
25.0000 mL | INTRAMUSCULAR | Status: AC
  Administered 2015-07-22 (×2): 25 mL via ORAL

## 2015-07-22 MED ORDER — NYSTATIN 100000 UNIT/GM EX POWD
Freq: Two times a day (BID) | CUTANEOUS | Status: DC | PRN
  Filled 2015-07-22: qty 15

## 2015-07-22 MED ORDER — ACETAMINOPHEN 650 MG RE SUPP: 650.0000 mg | Freq: Four times a day (QID) | RECTAL | Status: DC | PRN

## 2015-07-22 MED ORDER — ONDANSETRON HCL 4 MG/2ML IJ SOLN
4.0000 mg | Freq: Four times a day (QID) | INTRAMUSCULAR | Status: DC | PRN
  Administered 2015-07-22 – 2015-07-23 (×3): 4 mg via INTRAVENOUS
  Filled 2015-07-22 (×3): qty 2

## 2015-07-22 MED ORDER — DOCUSATE SODIUM 100 MG PO CAPS: 100.0000 mg | ORAL_CAPSULE | Freq: Two times a day (BID) | ORAL | Status: DC

## 2015-07-22 MED ORDER — TALC EX POWD
1.0000 "application " | CUTANEOUS | Status: DC | PRN
  Filled 2015-07-22: qty 71

## 2015-07-22 MED ORDER — CHOLECALCIFEROL 10 MCG (400 UNIT) PO TABS
400.0000 [IU] | ORAL_TABLET | Freq: Two times a day (BID) | ORAL | Status: DC
  Administered 2015-07-22 – 2015-07-25 (×6): 400 [IU] via ORAL
  Filled 2015-07-22 (×6): qty 1

## 2015-07-22 MED ORDER — AMIODARONE HCL 200 MG PO TABS
400.0000 mg | ORAL_TABLET | Freq: Every day | ORAL | Status: DC
  Filled 2015-07-22: qty 2

## 2015-07-22 MED ORDER — ASPIRIN EC 81 MG PO TBEC
81.0000 mg | DELAYED_RELEASE_TABLET | Freq: Every day | ORAL | Status: DC
  Administered 2015-07-22 – 2015-07-24 (×3): 81 mg via ORAL
  Filled 2015-07-22 (×3): qty 1

## 2015-07-22 MED ORDER — PIPERACILLIN-TAZOBACTAM 4.5 G IVPB
4.5000 g | Freq: Three times a day (TID) | INTRAVENOUS | Status: DC
  Administered 2015-07-22 – 2015-07-24 (×6): 4.5 g via INTRAVENOUS
  Filled 2015-07-22 (×8): qty 100

## 2015-07-22 MED ORDER — SUCRALFATE 1 G PO TABS
1.0000 g | ORAL_TABLET | Freq: Two times a day (BID) | ORAL | Status: DC
  Administered 2015-07-22 – 2015-07-25 (×6): 1 g via ORAL
  Filled 2015-07-22 (×7): qty 1

## 2015-07-22 MED ORDER — SODIUM CHLORIDE 0.9 % IJ SOLN
3.0000 mL | Freq: Two times a day (BID) | INTRAMUSCULAR | Status: DC
  Administered 2015-07-22 – 2015-07-24 (×2): 3 mL via INTRAVENOUS

## 2015-07-22 NOTE — Progress Notes (Signed)
Pt has headache and it is too soon for tylenol. BP low.  amiodorone and metoprolol scheduled.  MD said to give tylenol now and hold am dose of ami and met

## 2015-07-22 NOTE — Progress Notes (Signed)
Pt says her stomach in the upper area continues to hurt.  Pain med is not helping much.  MD ordered Norco and a CT

## 2015-07-22 NOTE — Progress Notes (Signed)
PHARMACIST - PHYSICIAN ORDER COMMUNICATION  CONCERNING: P&T Medication Policy on Herbal Medications  DESCRIPTION:  This patient's order for:  Cranberry-vitamin C  has been noted.  This product(s) is classified as an "herbal" or natural product. Due to a lack of definitive safety studies or FDA approval, nonstandard manufacturing practices, plus the potential risk of unknown drug-drug interactions while on inpatient medications, the Pharmacy and Therapeutics Committee does not permit the use of "herbal" or natural products of this type within [facility].   ACTION TAKEN: The pharmacy department is unable to verify this order at this time. Please reevaluate patient's clinical condition at discharge and address if the herbal or natural product(s) should be resumed at that time.

## 2015-07-22 NOTE — Care Management Note (Signed)
Case Management Note  Patient Details  Name: ETTER ROYALL MRN: [id number] Date of Birth: 1940-12-20  Subjective/Objective:                  Met with patient and her husband since patient just discharged home post pacemaker placement. She is usually independent with daily activities. She presents with abdominal pain and nausea this visit. WBC are WNL and she is currently afebrile. She states she has used [facility] in the past and agrees to them again if needed. She has a cane and a walker available at home. Her PCP is Dr. Ouida Sills with [facility]. She denies difficulty obtaining Rx.   Action/Plan: List of home health providers left with patient. Referral made to [facility] in case home health is needed. RNCM will continue to follow.   Expected Discharge Date:  07/25/15               Expected Discharge Plan:     In-House Referral:     Discharge planning Services  CM Consult  Post Acute Care Choice:  Home Health Choice offered to:  Patient, Spouse  DME Arranged:    DME Agency:     HH Arranged:    HH Agency:  [place]  Status of Service:  In process, will continue to follow  Medicare Important Message Given:    Date Medicare IM Given:    Medicare IM give by:    Date Additional Medicare IM Given:    Additional Medicare Important Message give by:     If discussed at [place] of Stay Meetings, dates discussed:    Additional Comments:  Marshell Garfinkel, RN 07/22/2015, 12:07 PM

## 2015-07-22 NOTE — Progress Notes (Signed)
CT results called to Dr Volanda Napoleon

## 2015-07-22 NOTE — Progress Notes (Signed)
Pt nauseated.  zofran not helping.  Phenergan ordered

## 2015-07-22 NOTE — Progress Notes (Signed)
Per Dr. Joyice Faster  The patient meets CMS criteria for an inpatient status based on 42 CFR 412.3. Rationale for an inpatient admission is supported by her clinical presentation, medical comorbidities, and risk for an adverse clinical outcome. The patient presents with sepsis felt to be secondary to UTI. Due to an increased risk of hemodynamic collapse from fulminant infection, patients with sepsis are typically treated with IV antibiotics for greater than two midnights. The execution of the patient's plan of care is expected to span at least two midnights and cannot be rendered safely in a setting of lower acuity.  This document electronically signed by: Physician Advisor: Joyice Faster MD Boarded: Internal Medicine Date: July 22, 2015 [employer], [facility]

## 2015-07-22 NOTE — Progress Notes (Signed)
Inpatient Diabetes Program Recommendations  AACE/ADA: New Consensus Statement on Inpatient Glycemic Control (2015)  Target Ranges:  Prepandial:   less than 140 mg/dL      Peak postprandial:   less than 180 mg/dL (1-2 hours)      Critically ill patients:  140 - 180 mg/dL  Results for PIERRE, WEINSTOCK (MRN [id number]) as of 07/22/2015 11:26  Ref. Range 07/22/2015 01:23 07/22/2015 07:30  Glucose-Capillary Latest Ref Range: 65-99 mg/dL 192 (H) 263 (H)   Review of Glycemic Control  Diabetes history: DM2 Outpatient Diabetes medications: Levemir 40 units QHS, Novolog 15-35 units TID with meals (based on glucose) Current orders for Inpatient glycemic control: Levemir 25 units QHS, Novolog 0-15 units TID with meals  Inpatient Diabetes Program Recommendations: Insulin - Basal: Please consider increasing Lantus to 30 units QHS. Insulin - Meal Coverage: If post prandial glucose is consistently elevated, may want to consider ordering Novolog meal coverage in addition to Novolog correction scale. Insulin-Correction: Please consider ordering Novolog bedtime correction scale.  Thanks, Barnie Alderman, RN, MSN, CDE Diabetes Coordinator Inpatient Diabetes Program [phone] (Team Pager from [place] to [place]) [phone] ([place] office) [phone] [facility] office) [phone] [facility] office)

## 2015-07-22 NOTE — Progress Notes (Signed)
ANTIBIOTIC CONSULT NOTE - INITIAL  Pharmacy Consult for Zosyn/Vancomycin Indication: rule out sepsis  Allergies  Allergen Reactions  . Cephalosporins Other (See Comments)    Reaction:  Unknown   . Hydrocodone-Acetaminophen Hives  . Macrolides And Ketolides Other (See Comments)    Reaction:  Unknown   . Meperidine Shortness Of Breath, Nausea Only and Other (See Comments)    Reaction:  Stomach pain   . Shellfish Allergy Shortness Of Breath, Diarrhea, Nausea Only, Rash and Other (See Comments)    Reaction:  Stomach pain   . Sulfa Antibiotics Shortness Of Breath, Diarrhea, Nausea Only and Other (See Comments)    Reaction:  Stomach pain   . Aspirin Other (See Comments)    Reaction:  Stomach pain   . Celecoxib Other (See Comments)    Reaction:  GI bleed, weakness, and stomach pain.    . Cephalexin Diarrhea, Nausea Only and Other (See Comments)    Reaction:  Stomach pain   . Erythromycin Diarrhea, Nausea Only and Other (See Comments)    Reaction:  Stomach pain   . Hydromorphone Other (See Comments)    Reaction:  Hypotension   . Iodinated Diagnostic Agents Rash and Other (See Comments)    Pt states that she is unable to have because she has chronic kidney disease.    Marland Kitchen Oxycodone Other (See Comments)    Reaction:  Stomach pain   . Atorvastatin Nausea Only and Other (See Comments)    Reaction:  Weakness   . Codeine Diarrhea, Nausea Only and Other (See Comments)    Reaction:  Stomach pain   . Prednisone Other (See Comments)    Reaction:  Increases pts blood sugar  Pt states that she is allergic to all steroids.    . Tape Other (See Comments)    Reaction:  Causes pts skin to tear  Pt states that she is able to use paper tape.    . Valdecoxib Swelling and Other (See Comments)    Pt states that her hands and feet swell.    . Iodine Rash    Patient Measurements: Height: 5\' 4"  (162.6 cm) Weight: 266 lb 14.4 oz (121.065 kg) IBW/kg (Calculated) : 54.7 Adjusted Body Weight: 81.3  kg  Vital Signs: Temp: 98.4 F (36.9 C) (11/09 0114) Temp Source: Oral (11/09 0114) BP: 102/68 mmHg (11/09 0114) Pulse Rate: 103 (11/09 0114) Intake/Output from previous day:   Intake/Output from this shift:    Labs:  Recent Labs  07/21/15 2033  WBC 7.3  HGB 11.2*  PLT 175  CREATININE 1.55*   Estimated Creatinine Clearance: 40.9 mL/min (by C-G formula based on Cr of 1.55). No results for input(s): VANCOTROUGH, VANCOPEAK, VANCORANDOM, GENTTROUGH, GENTPEAK, GENTRANDOM, TOBRATROUGH, TOBRAPEAK, TOBRARND, AMIKACINPEAK, AMIKACINTROU, AMIKACIN in the last 72 hours.   Microbiology: Recent Results (from the past 720 hour(s))  MRSA PCR Screening     Status: None   Collection Time: 06/29/15 12:18 PM  Result Value Ref Range Status   MRSA by PCR NEGATIVE NEGATIVE Final    Comment:        The GeneXpert MRSA Assay (FDA approved for NASAL specimens only), is one component of a comprehensive MRSA colonization surveillance program. It is not intended to diagnose MRSA infection nor to guide or monitor treatment for MRSA infections.     Medical History: Past Medical History  Diagnosis Date  . Diabetes mellitus without complication ([place])   . Hypertension   . Morbid obesity ([place])   . Chronic kidney disease (  CKD) stage G3a/A1, moderately decreased glomerular filtration rate (GFR) between 45-59 mL/min/1.73 square meter and albuminuria creatinine ratio less than 30 mg/g   . Paroxysmal atrial fibrillation ([facility])     Medications:  Infusions:  . sodium chloride     Assessment: 73 yof cc chills, malaise. Urine showed WBC TNTC, initiated broad spectrum abx.   Vd 56.9 L, Ke 0.038 hr-1, T1/2 18.1 hr.  Goal of Therapy:  Vancomycin trough level 15-20 mcg/ml  Plan:  Expected duration 7 days with resolution of temperature and/or normalization of WBC. Zosyn 4.5 gm IV Q8H for BMI > 40, vancomycin 1 gm IV Q18H with stacked dosing second dose 6 hours after first, predicted trough 18  mcg/mL, will follow and adjust as needed to maintain trough 15 to 20 mcg/mL.  Laural Benes, Pharm.D.  Clinical Pharmacist 07/22/2015,1:26 AM

## 2015-07-22 NOTE — Progress Notes (Signed)
[facility] [place] NAME: Deborah Obrien    MR#:  [id number]  DATE OF BIRTH:  1941-07-19  SUBJECTIVE:  CHIEF COMPLAINT:   Chief Complaint  Patient presents with  . Chest Pain  . Fever   Feeling much better. Sitting up eating breakfast. No new complaints. No abdominal pain  REVIEW OF SYSTEMS:   Review of Systems  Constitutional: Positive for fever, chills and malaise/fatigue.  Respiratory: Negative for shortness of breath.   Cardiovascular: Negative for chest pain and palpitations.  Gastrointestinal: Negative for nausea, vomiting and abdominal pain.  Genitourinary: Positive for dysuria, urgency and frequency.  Neurological: Positive for weakness.    DRUG ALLERGIES:   Allergies  Allergen Reactions  . Cephalosporins Other (See Comments)    Reaction:  Unknown   . Hydrocodone-Acetaminophen Hives  . Macrolides And Ketolides Other (See Comments)    Reaction:  Unknown   . Meperidine Shortness Of Breath, Nausea Only and Other (See Comments)    Reaction:  Stomach pain   . Shellfish Allergy Shortness Of Breath, Diarrhea, Nausea Only, Rash and Other (See Comments)    Reaction:  Stomach pain   . Sulfa Antibiotics Shortness Of Breath, Diarrhea, Nausea Only and Other (See Comments)    Reaction:  Stomach pain   . Aspirin Other (See Comments)    Reaction:  Stomach pain   . Celecoxib Other (See Comments)    Reaction:  GI bleed, weakness, and stomach pain.    . Cephalexin Diarrhea, Nausea Only and Other (See Comments)    Reaction:  Stomach pain   . Erythromycin Diarrhea, Nausea Only and Other (See Comments)    Reaction:  Stomach pain   . Hydromorphone Other (See Comments)    Reaction:  Hypotension   . Iodinated Diagnostic Agents Rash and Other (See Comments)    Pt states that she is unable to have because she has chronic kidney disease.    Marland Kitchen Oxycodone Other (See Comments)    Reaction:  Stomach pain   . Atorvastatin Nausea Only  and Other (See Comments)    Reaction:  Weakness   . Codeine Diarrhea, Nausea Only and Other (See Comments)    Reaction:  Stomach pain   . Prednisone Other (See Comments)    Reaction:  Increases pts blood sugar  Pt states that she is allergic to all steroids.    . Tape Other (See Comments)    Reaction:  Causes pts skin to tear  Pt states that she is able to use paper tape.    . Valdecoxib Swelling and Other (See Comments)    Pt states that her hands and feet swell.    . Iodine Rash    VITALS:  Blood pressure 102/60, pulse 102, temperature 96.8 F (36 C), temperature source Axillary, resp. rate 18, height 5\' 4"  (1.626 m), weight 121.246 kg (267 lb 4.8 oz), SpO2 99 %.  PHYSICAL EXAMINATION:  GENERAL:  74 y.o.-year-old patient sitting up, eating breakfast, no distress obese LUNGS: Normal breath sounds bilaterally, no wheezing, rales,rhonchi or crepitation. No use of accessory muscles of respiration.  CARDIOVASCULAR: S1, S2 normal. No murmurs, rubs, or gallops.  ABDOMEN: Soft, nontender, nondistended. Bowel sounds present. No organomegaly or mass.  EXTREMITIES: No pedal edema, cyanosis, or clubbing.  NEUROLOGIC: Cranial nerves II through XII are intact. Muscle strength 5/5 in all extremities. Sensation intact. Gait not checked.  PSYCHIATRIC: The patient is alert and oriented x 3.  SKIN: No obvious rash,  lesion, or ulcer.    LABORATORY PANEL:   CBC  Recent Labs Lab 07/21/15 2033  WBC 7.3  HGB 11.2*  HCT 34.2*  PLT 175   ------------------------------------------------------------------------------------------------------------------  Chemistries   Recent Labs Lab 07/21/15 2033  NA 139  K 3.8  CL 105  CO2 24  GLUCOSE 151*  BUN 26*  CREATININE 1.55*  CALCIUM 8.2*  AST 45*  ALT 27  ALKPHOS 121  BILITOT 2.5*   ------------------------------------------------------------------------------------------------------------------  Cardiac Enzymes  Recent Labs Lab  07/22/15 0745  TROPONINI <0.03   ------------------------------------------------------------------------------------------------------------------  RADIOLOGY:  Dg Chest 1 View  07/21/2015  CLINICAL DATA:  Chest pain and fever, onset this morning. EXAM: CHEST 1 VIEW COMPARISON:  07/01/2015 FINDINGS: Single lead left-sided pacemaker, tip projecting over the right ventricle. Cardiomediastinal contours are unchanged. There is no consolidation, large pleural effusion or pneumothorax. Postsurgical change in the lower cervical spine, partially included. Limited assessment of the left lung base due to soft tissue attenuation from body habitus. IMPRESSION: No acute pulmonary process. Electronically Signed   By: Jeb Levering M.D.   On: 07/21/2015 21:10    EKG:   Orders placed or performed during the hospital encounter of 06/29/15  . EKG 12-Lead  . EKG 12-Lead  . EKG 12-Lead in am (before 8am)  . EKG 12-Lead in am (before 8am)    ASSESSMENT AND PLAN:   This is a 74 year old Caucasian female admitted for sepsis.  1. Sepsis:  - due to uti - Blood pressure improving, fever trend down, heart rate stable  #2 urinary tract infection - Urine and blood cultures pending - Continue vancomycin and Zosyn for now  3. Essential hypertension:  - Has been hypotensive, continue IV rehydration - Continue metoprolol  3. Diabetes mellitus type 2:  - Continue basal insulin, increased to 30. Sliding scale insulin while hospitalized  - Check A1c  4. 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  - Dose vancomycin renal function.  - Continue intravenous fluid  5. Atrial fibrillation: Continue amiodarone 6. Depression/anxiety: Continue paroxetine 7. DVT prophylaxis: Heparin 8. GI prophylaxis: Pantoprazole  All the records are reviewed and case discussed with Care Management/Social Workerr. Management plans discussed with the patient, family and they are in agreement.  CODE STATUS: Full  TOTAL  TIME TAKING CARE OF THIS PATIENT: 20 minutes.  Greater than 50% of time spent in care coordination and counseling. POSSIBLE D/C IN 1-2 DAYS, DEPENDING ON CLINICAL CONDITION.   Myrtis Ser M.D on 07/22/2015 at 11:38 AM  Between 7am to 6pm - Pager - [phone]  After 6pm go to [email or web address] - password [email or web address] [facility]  Office  [phone]  CC: Primary care physician; [name]., MD

## 2015-07-22 NOTE — Plan of Care (Signed)
Problem: Safety: Goal: Ability to remain free from injury will improve Outcome: Progressing Free from falls this shift. Pt able to ask for assistance when ambulating  Problem: Pain Managment: Goal: General experience of comfort will improve Outcome: Progressing Pt stating that she is starting to feel better.

## 2015-07-22 NOTE — H&P (Addendum)
Deborah Obrien is an 74 y.o. female.   Chief Complaint: Chills HPI: The patient presents emergency department complaining of chills and malaise. She states that she awoke this morning with abdominal pain and some mid epigastric pain that felt as if it were radiating into her chest. She underwent pacemaker placement possibly 3 weeks ago and called her cardiologist recommended that she be evaluated in the emergency department. Here she was found to be febrile and tachycardic. Urine showed too numerous to count white blood cells. Blood cultures were obtained and broad-spectrum antibiotics initiated. Emergency department staff subsequently called the hospitalist service for admission.  Past Medical History  Diagnosis Date  . Diabetes mellitus without complication ([place])   . Hypertension   . Morbid obesity ([place])   . Chronic kidney disease (CKD) stage G3a/A1, moderately decreased glomerular filtration rate (GFR) between 45-59 mL/min/1.73 square meter and albuminuria creatinine ratio less than 30 mg/g   . Paroxysmal atrial fibrillation [facility])     Past Surgical History  Procedure Laterality Date  . Shoulder arthroscopy with subacromial decompression Left   . Esophagogastroduodenoscopy N/A 01/27/2015    Procedure: ESOPHAGOGASTRODUODENOSCOPY (EGD);  Surgeon: Lollie Sails, MD;  Location: [facility] ENDOSCOPY;  Service: Endoscopy;  Laterality: N/A;  . Colon surgery    . 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/2015    Procedure: INSERTION PACEMAKER;  Surgeon: Isaias Cowman, MD;  Location: [facility] ORS;  Service: Cardiovascular;  Laterality: Left;    Family History  Problem Relation Age of Onset  . Diabetes Mellitus II Mother   . CAD Mother    Social History:  reports that she has never smoked. She does not have any smokeless tobacco history on file. She reports that she does not drink alcohol. Her drug history is not on file.  Allergies:  Allergies  Allergen Reactions  . Cephalosporins Other (See  Comments)    Reaction:  Unknown   . Hydrocodone-Acetaminophen Hives  . Macrolides And Ketolides Other (See Comments)    Reaction:  Unknown   . Meperidine Shortness Of Breath, Nausea Only and Other (See Comments)    Reaction:  Stomach pain   . Shellfish Allergy Shortness Of Breath, Diarrhea, Nausea Only, Rash and Other (See Comments)    Reaction:  Stomach pain   . Sulfa Antibiotics Shortness Of Breath, Diarrhea, Nausea Only and Other (See Comments)    Reaction:  Stomach pain   . Aspirin Other (See Comments)    Reaction:  Stomach pain   . Celecoxib Other (See Comments)    Reaction:  GI bleed, weakness, and stomach pain.    . Cephalexin Diarrhea, Nausea Only and Other (See Comments)    Reaction:  Stomach pain   . Erythromycin Diarrhea, Nausea Only and Other (See Comments)    Reaction:  Stomach pain   . Hydromorphone Other (See Comments)    Reaction:  Hypotension   . Iodinated Diagnostic Agents Rash and Other (See Comments)    Pt states that she is unable to have because she has chronic kidney disease.    Marland Kitchen Oxycodone Other (See Comments)    Reaction:  Stomach pain   . Atorvastatin Nausea Only and Other (See Comments)    Reaction:  Weakness   . Codeine Diarrhea, Nausea Only and Other (See Comments)    Reaction:  Stomach pain   . Prednisone Other (See Comments)    Reaction:  Increases pts blood sugar  Pt states that she is allergic to all steroids.    . Tape Other (See  Comments)    Reaction:  Causes pts skin to tear  Pt states that she is able to use paper tape.    . Valdecoxib Swelling and Other (See Comments)    Pt states that her hands and feet swell.    . Iodine Rash    Prior to Admission medications   Medication Sig Start Date End Date Taking? Authorizing Provider  amiodarone (PACERONE) 400 MG tablet Take 1 tablet (400 mg total) by mouth daily. 07/03/15  Yes Vaughan Basta, MD  APPLE CIDER VINEGAR PO Take 1 tablet by mouth 2 (two) times daily.   Yes Historical  Provider, MD  aspirin EC 81 MG tablet Take 81 mg by mouth at bedtime.   Yes Historical Provider, MD  Calcium Carbonate-Vitamin D (CALCIUM 600+D) 600-400 MG-UNIT tablet Take 1 tablet by mouth 2 (two) times daily.   Yes Historical Provider, MD  Cholecalciferol (VITAMIN D3) 400 UNITS tablet Take 400-800 Units by mouth 2 (two) times daily. Pt takes two tablets in the morning and one tablet at bedtime.   Yes Historical Provider, MD  CINNAMON PO Take 2 tablets by mouth 2 (two) times daily.   Yes Historical Provider, MD  Cranberry-Vitamin C (AZO CRANBERRY URINARY TRACT) 250-60 MG CAPS Take 2 capsules by mouth 2 (two) times daily.   Yes Historical Provider, MD  docusate sodium (COLACE) 100 MG capsule Take 200 mg by mouth at bedtime.   Yes Historical Provider, MD  furosemide (LASIX) 40 MG tablet Take 40 mg by mouth daily as needed for edema.   Yes Historical Provider, MD  hydrocortisone 2.5 % cream Apply 1 application topically at bedtime as needed (for rash under breasts).    Yes Historical Provider, MD  insulin aspart (NOVOLOG) 100 UNIT/ML injection Inject 15-35 Units into the skin 3 (three) times daily before meals. Pt uses as needed per sliding scale:    0-150:  15 units  151-200:  17 units  201-250:  19 units  251-300:  23 units  301-350:  27 units  351-400:  31 units  401 and greater:  35 units   Yes Historical Provider, MD  insulin detemir (LEVEMIR) 100 UNIT/ML injection Inject 40 Units into the skin at bedtime.   Yes Historical Provider, MD  ketoconazole (NIZORAL) 2 % cream Apply 1 application topically every 2 (two) hours as needed (for rash under breasts).    Yes Historical Provider, MD  metoprolol tartrate (LOPRESSOR) 25 MG tablet Take 25 mg by mouth 2 (two) times daily.    Yes Historical Provider, MD  Multiple Vitamin (MULTIVITAMIN WITH MINERALS) TABS tablet Take 1 tablet by mouth daily.   Yes Historical Provider, MD  oxybutynin (DITROPAN-XL) 10 MG 24 hr tablet Take 10 mg by mouth at  bedtime.   Yes Historical Provider, MD  pantoprazole (PROTONIX) 40 MG tablet Take 40 mg by mouth 2 (two) times daily.   Yes Historical Provider, MD  PARoxetine (PAXIL) 20 MG tablet Take 20 mg by mouth daily.   Yes Historical Provider, MD  pravastatin (PRAVACHOL) 20 MG tablet Take 20 mg by mouth at bedtime.    Yes Historical Provider, MD  sucralfate (CARAFATE) 1 G tablet Take 1 g by mouth 2 (two) times daily.    Yes Historical Provider, MD  talc (ZEASORB) powder Apply 1 application topically as needed (for rash under breasts).   Yes Historical Provider, MD     Results for orders placed or performed during the hospital encounter of 07/21/15 (from the past 48  hour(s))  CBC with Differential/Platelet     Status: Abnormal   Collection Time: 07/21/15  8:33 PM  Result Value Ref Range   WBC 7.3 3.6 - 11.0 K/uL   RBC 4.07 3.80 - 5.20 MIL/uL   Hemoglobin 11.2 (L) 12.0 - 16.0 g/dL   HCT 34.2 (L) 35.0 - 47.0 %   MCV 84.1 80.0 - 100.0 fL   MCH 27.5 26.0 - 34.0 pg   MCHC 32.6 32.0 - 36.0 g/dL   RDW 17.6 (H) 11.5 - 14.5 %   Platelets 175 150 - 440 K/uL   Neutrophils Relative % 87 %   Neutro Abs 6.4 1.4 - 6.5 K/uL   Lymphocytes Relative 8 %   Lymphs Abs 0.6 (L) 1.0 - 3.6 K/uL   Monocytes Relative 3 %   Monocytes Absolute 0.2 0.2 - 0.9 K/uL   Eosinophils Relative 1 %   Eosinophils Absolute 0.0 0 - 0.7 K/uL   Basophils Relative 1 %   Basophils Absolute 0.0 0 - 0.1 K/uL  Comprehensive metabolic panel     Status: Abnormal   Collection Time: 07/21/15  8:33 PM  Result Value Ref Range   Sodium 139 135 - 145 mmol/L   Potassium 3.8 3.5 - 5.1 mmol/L   Chloride 105 101 - 111 mmol/L   CO2 24 22 - 32 mmol/L   Glucose, Bld 151 (H) 65 - 99 mg/dL   BUN 26 (H) 6 - 20 mg/dL   Creatinine, Ser 1.55 (H) 0.44 - 1.00 mg/dL   Calcium 8.2 (L) 8.9 - 10.3 mg/dL   Total Protein 6.5 6.5 - 8.1 g/dL   Albumin 3.4 (L) 3.5 - 5.0 g/dL   AST 45 (H) 15 - 41 U/L   ALT 27 14 - 54 U/L   Alkaline Phosphatase 121 38 - 126 U/L    Total Bilirubin 2.5 (H) 0.3 - 1.2 mg/dL   GFR calc non Af Amer 32 (L) >60 mL/min   GFR calc Af Amer 37 (L) >60 mL/min    Comment: (NOTE) The eGFR has been calculated using the CKD EPI equation. This calculation has not been validated in all clinical situations. eGFR's persistently <60 mL/min signify possible Chronic Kidney Disease.    Anion gap 10 5 - 15  Troponin I     Status: None   Collection Time: 07/21/15  8:33 PM  Result Value Ref Range   Troponin I <0.03 <0.031 ng/mL    Comment:        NO INDICATION OF MYOCARDIAL INJURY.   Lactic acid, plasma     Status: Abnormal   Collection Time: 07/21/15  8:33 PM  Result Value Ref Range   Lactic Acid, Venous 2.2 (HH) 0.5 - 2.0 mmol/L    Comment: CRITICAL RESULT CALLED TO, READ BACK BY AND VERIFIED WITH NELLIE MONAR AT 2113 ON 07/21/15 RWW   Lipase, blood     Status: None   Collection Time: 07/21/15  8:33 PM  Result Value Ref Range   Lipase 21 11 - 51 U/L  Urinalysis complete, with microscopic ([facility] only)     Status: Abnormal   Collection Time: 07/21/15  9:39 PM  Result Value Ref Range   Color, Urine COLORLESS (A) YELLOW   APPearance CLEAR (A) CLEAR   Glucose, UA NEGATIVE NEGATIVE mg/dL   Bilirubin Urine NEGATIVE NEGATIVE   Ketones, ur NEGATIVE NEGATIVE mg/dL   Specific Gravity, Urine 1.005 1.005 - 1.030   Hgb urine dipstick NEGATIVE NEGATIVE   pH 5.0 5.0 -  8.0   Protein, ur NEGATIVE NEGATIVE mg/dL   Nitrite NEGATIVE NEGATIVE   Leukocytes, UA NEGATIVE NEGATIVE   RBC / HPF 0-5 0 - 5 RBC/hpf   WBC, UA TOO NUMEROUS TO COUNT 0 - 5 WBC/hpf   Bacteria, UA MANY (A) NONE SEEN   Squamous Epithelial / LPF 0-5 (A) NONE SEEN  Lactic acid, plasma     Status: None   Collection Time: 07/21/15 11:33 PM  Result Value Ref Range   Lactic Acid, Venous 0.9 0.5 - 2.0 mmol/L   Dg Chest 1 View  07/21/2015  CLINICAL DATA:  Chest pain and fever, onset this morning. EXAM: CHEST 1 VIEW COMPARISON:  07/01/2015 FINDINGS: Single lead left-sided  pacemaker, tip projecting over the right ventricle. Cardiomediastinal contours are unchanged. There is no consolidation, large pleural effusion or pneumothorax. Postsurgical change in the lower cervical spine, partially included. Limited assessment of the left lung base due to soft tissue attenuation from body habitus. IMPRESSION: No acute pulmonary process. Electronically Signed   By: Jeb Levering M.D.   On: 07/21/2015 21:10    Review of Systems  Constitutional: Positive for chills. Negative for fever.  HENT: Negative for sore throat and tinnitus.   Eyes: Negative for blurred vision and redness.  Respiratory: Negative for cough and shortness of breath.   Cardiovascular: Positive for chest pain. Negative for palpitations, orthopnea and PND.  Gastrointestinal: Positive for nausea and abdominal pain. Negative for vomiting and diarrhea.  Genitourinary: Negative for dysuria, urgency and frequency.  Musculoskeletal: Negative for myalgias and joint pain.  Skin: Negative for rash.       No lesions  Neurological: Negative for speech change, focal weakness and weakness.  Endo/Heme/Allergies: Does not bruise/bleed easily.       No temperature intolerance  Psychiatric/Behavioral: Negative for depression and suicidal ideas.    Blood pressure 104/45, pulse 106, temperature 103.1 F (39.5 C), resp. rate 29, SpO2 100 %. Physical Exam  Vitals reviewed. Constitutional: She is oriented to person, place, and time. She appears well-developed and well-nourished. No distress.  HENT:  Head: Normocephalic and atraumatic.  Mouth/Throat: Oropharynx is clear and moist.  Eyes: Conjunctivae and EOM are normal. Pupils are equal, round, and reactive to light. No scleral icterus.  Neck: Normal range of motion. Neck supple. No JVD present. No tracheal deviation present. No thyromegaly present.  Cardiovascular: Normal rate, regular rhythm and normal heart sounds.  Exam reveals no gallop and no friction rub.   No  murmur heard. Respiratory: Effort normal and breath sounds normal.  GI: Soft. Bowel sounds are normal. She exhibits no distension. There is no tenderness.  Genitourinary:  Deferred  Musculoskeletal: Normal range of motion. She exhibits no edema.  Lymphadenopathy:    She has no cervical adenopathy.  Neurological: She is alert and oriented to person, place, and time. No cranial nerve deficit. She exhibits normal muscle tone.  Skin: Skin is warm and dry. No rash noted. No erythema.  Psychiatric: She has a normal mood and affect. Her behavior is normal. Judgment and thought content normal.     Assessment/Plan This is a 74 year old Caucasian female admitted for sepsis. 1. Sepsis: Patient meets criteria via fever, heart rate and intermittent tachypnea. Possible urine is the source even though it is nitrite negative and no bacteria seen. Follow blood cultures and urine cultures for growth and sensitivities. The patient is hemodynamically stable. 2. Essential hypertension: Continue metoprolol 3. Diabetes mellitus type 2: Continue basal insulin. Sliding scale insulin while hospitalized  4. Chronic kidney disease: Stage III. Dose vancomycin renal function. Continue intravenous fluid 5. Atrial fibrillation: Continue amiodarone 6. Depression/anxiety: Continue paroxetine 7. DVT prophylaxis: Heparin 8. GI prophylaxis: Pantoprazole The patient is a full code. Time spent on admission orders and patient care approximately 45 minutes  Harrie Foreman 07/22/2015, 12:44 AM

## 2015-07-23 ENCOUNTER — Inpatient Hospital Stay
Admit: 2015-07-23 | Discharge: 2015-07-23 | Disposition: A | Discharge status: Home / Self Care | Payer: Medicare Other | Attending: Internal Medicine | Admitting: Internal Medicine

## 2015-07-23 DIAGNOSIS — K219 Gastro-esophageal reflux disease without esophagitis: Secondary | ICD-10-CM

## 2015-07-23 DIAGNOSIS — R1013 Epigastric pain: Secondary | ICD-10-CM

## 2015-07-23 LAB — URINE CULTURE
Culture: >=100000
Special Requests: NORMAL

## 2015-07-23 LAB — CBC
HCT: 28.9 % — ABNORMAL LOW (ref 35.0–47.0)
HEMOGLOBIN: 9.3 g/dL — AB (ref 12.0–16.0)
MCH: 27.2 pg (ref 26.0–34.0)
MCHC: 32.1 g/dL (ref 32.0–36.0)
MCV: 84.8 fL (ref 80.0–100.0)
PLATELETS: 149 10*3/uL — AB (ref 150–440)
RBC: 3.41 MIL/uL — AB (ref 3.80–5.20)
RDW: 17.7 % — ABNORMAL HIGH (ref 11.5–14.5)
WBC: 8.1 10*3/uL (ref 3.6–11.0)

## 2015-07-23 LAB — HEPATIC FUNCTION PANEL
ALBUMIN: 2.7 g/dL — AB (ref 3.5–5.0)
ALT: 23 U/L (ref 14–54)
AST: 36 U/L (ref 15–41)
Alkaline Phosphatase: 131 U/L — ABNORMAL HIGH (ref 38–126)
BILIRUBIN TOTAL: 3.3 mg/dL — AB (ref 0.3–1.2)
Bilirubin, Direct: 1.9 mg/dL — ABNORMAL HIGH (ref 0.1–0.5)
Indirect Bilirubin: 1.4 mg/dL — ABNORMAL HIGH (ref 0.3–0.9)
Total Protein: 5.6 g/dL — ABNORMAL LOW (ref 6.5–8.1)

## 2015-07-23 LAB — BASIC METABOLIC PANEL
Anion gap: 8 (ref 5–15)
BUN: 22 mg/dL — AB (ref 6–20)
CHLORIDE: 107 mmol/L (ref 101–111)
CO2: 23 mmol/L (ref 22–32)
Calcium: 7.7 mg/dL — ABNORMAL LOW (ref 8.9–10.3)
Creatinine, Ser: 1.58 mg/dL — ABNORMAL HIGH (ref 0.44–1.00)
GFR, EST AFRICAN AMERICAN: 36 mL/min — AB (ref >60)
GFR, EST NON AFRICAN AMERICAN: 31 mL/min — AB (ref >60)
Glucose, Bld: 137 mg/dL — ABNORMAL HIGH (ref 65–99)
POTASSIUM: 4 mmol/L (ref 3.5–5.1)
SODIUM: 138 mmol/L (ref 135–145)

## 2015-07-23 LAB — LIPASE, BLOOD: LIPASE: 17 U/L (ref 11–51)

## 2015-07-23 LAB — GLUCOSE, CAPILLARY
Glucose-Capillary: 119 mg/dL — ABNORMAL HIGH (ref 65–99)
Glucose-Capillary: 175 mg/dL — ABNORMAL HIGH (ref 65–99)
Glucose-Capillary: 180 mg/dL — ABNORMAL HIGH (ref 65–99)
Glucose-Capillary: 214 mg/dL — ABNORMAL HIGH (ref 65–99)

## 2015-07-23 LAB — TROPONIN I: Troponin I: <0.03 ng/mL (ref <0.031)

## 2015-07-23 MED ORDER — INSULIN ASPART 100 UNIT/ML ~~LOC~~ SOLN: 0.0000 [IU] | Freq: Three times a day (TID) | SUBCUTANEOUS | Status: DC

## 2015-07-23 MED ORDER — INSULIN ASPART 100 UNIT/ML ~~LOC~~ SOLN
0.0000 [IU] | Freq: Three times a day (TID) | SUBCUTANEOUS | Status: DC
  Administered 2015-07-23: 5 [IU] via SUBCUTANEOUS
  Administered 2015-07-24 (×3): 3 [IU] via SUBCUTANEOUS
  Administered 2015-07-25: 5 [IU] via SUBCUTANEOUS
  Filled 2015-07-23 (×2): qty 3
  Filled 2015-07-23: qty 1
  Filled 2015-07-23: qty 5

## 2015-07-23 MED ORDER — SODIUM CHLORIDE 0.9 % IV BOLUS (SEPSIS)
500.0000 mL | INTRAVENOUS | Status: AC
  Administered 2015-07-23: 12:00:00 via INTRAVENOUS

## 2015-07-23 MED ORDER — GI COCKTAIL ~~LOC~~
30.0000 mL | ORAL | Status: AC
  Administered 2015-07-23: 30 mL via ORAL
  Filled 2015-07-23: qty 30

## 2015-07-23 MED ORDER — METOCLOPRAMIDE HCL 5 MG/ML IJ SOLN
10.0000 mg | Freq: Four times a day (QID) | INTRAMUSCULAR | Status: DC | PRN
  Administered 2015-07-23 (×2): 10 mg via INTRAVENOUS
  Filled 2015-07-23 (×2): qty 2

## 2015-07-23 NOTE — Clinical Documentation Improvement (Signed)
Internal Medicine  Abnormal Lab/Test Results:   Component      RBC Hemoglobin HCT  Latest Ref Rng      3.80 - 5.20 MIL/uL 12.0 - 16.0 g/dL 35.0 - 47.0 %  07/21/2015     8:33 PM 4.07 11.2 (L) 34.2 (L)  07/23/2015      3.41 (L) 9.3 (L) 28.9 (L)   Possible Clinical Conditions associated with below indicators  Anemia (if present, please specify acuity and type)  Other Condition  Cannot Clinically Determine  Please exercise your independent, professional judgment when responding. A specific answer is not anticipated or expected. Please update your documentation within the medical record to reflect your response to this query.   Thank you, Mateo Flow, RN [phone] Clinical Documentation Specialist

## 2015-07-23 NOTE — Consult Note (Signed)
[facility]  [address], [place], [place] [address] Phone: [phone] Fax : [phone]  Consultation  Referring Provider:     No ref. provider found Primary Care Physician:  [name]., MD Primary Gastroenterologist:  Dr. Gustavo Lah         Reason for Consultation:     Abdominal pain and anemia.  Date of Admission:  07/21/2015 Date of Consultation:  07/23/2015         HPI:   Deborah Obrien is a 74 y.o. female who was admitted with fevers and chills and presumed urinary tract infection. The patient reports that she had abdominal pain in the epigastric region and felt radiating to her chest. The patient has had GI problems in the past with heme positive stools and gastric polyps that were bleeding. The patient reports that she's had an upper endoscopy with 17 gastric polyps removed. The patient also states that she has been set up for a capsule endoscopy that she has not had done yet. She also reports that she has had significant weight loss since this all started. The patient had a pacemaker placed 3 weeks ago. There is no report of any black stools or bloody stools and she states that she had a brown stool today. The patient states that she is on Protonix twice a day and also takes Carafate. Despite this she still has the belly pain and only felt some relief with a GI cocktail. The patient has also been treated with Phenergan for her nausea. The patient thinks that her symptoms are related to reflux.  Past Medical History  Diagnosis Date  . Diabetes mellitus without complication ([place])   . Hypertension   . Morbid obesity ([place])   . Chronic kidney disease (CKD) stage G3a/A1, moderately decreased glomerular filtration rate (GFR) between 45-59 mL/min/1.73 square meter and albuminuria creatinine ratio less than 30 mg/g   . Paroxysmal atrial fibrillation [facility])     Past Surgical History  Procedure Laterality Date  . Shoulder arthroscopy with subacromial  decompression Left   . Esophagogastroduodenoscopy N/A 01/27/2015    Procedure: ESOPHAGOGASTRODUODENOSCOPY (EGD);  Surgeon: Lollie Sails, MD;  Location: [facility] ENDOSCOPY;  Service: Endoscopy;  Laterality: N/A;  . Colon surgery    . 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/2015    Procedure: INSERTION PACEMAKER;  Surgeon: Isaias Cowman, MD;  Location: [facility] ORS;  Service: Cardiovascular;  Laterality: Left;    Prior to Admission medications   Medication Sig Start Date End Date Taking? Authorizing Provider  amiodarone (PACERONE) 400 MG tablet Take 1 tablet (400 mg total) by mouth daily. 07/03/15  Yes Vaughan Basta, MD  APPLE CIDER VINEGAR PO Take 1 tablet by mouth 2 (two) times daily.   Yes Historical Provider, MD  aspirin EC 81 MG tablet Take 81 mg by mouth at bedtime.   Yes Historical Provider, MD  Calcium Carbonate-Vitamin D (CALCIUM 600+D) 600-400 MG-UNIT tablet Take 1 tablet by mouth 2 (two) times daily.   Yes Historical Provider, MD  Cholecalciferol (VITAMIN D3) 400 UNITS tablet Take 400-800 Units by mouth 2 (two) times daily. Pt takes two tablets in the morning and one tablet at bedtime.   Yes Historical Provider, MD  CINNAMON PO Take 2 tablets by mouth 2 (two) times daily.   Yes Historical Provider, MD  Cranberry-Vitamin C (AZO CRANBERRY URINARY TRACT) 250-60 MG CAPS Take 2 capsules by mouth 2 (two) times daily.   Yes Historical Provider, MD  docusate sodium (COLACE) 100 MG capsule Take 200  mg by mouth at bedtime.   Yes Historical Provider, MD  furosemide (LASIX) 40 MG tablet Take 40 mg by mouth daily as needed for edema.   Yes Historical Provider, MD  hydrocortisone 2.5 % cream Apply 1 application topically at bedtime as needed (for rash under breasts).    Yes Historical Provider, MD  insulin aspart (NOVOLOG) 100 UNIT/ML injection Inject 15-35 Units into the skin 3 (three) times daily before meals. Pt uses as needed per sliding scale:    0-150:  15 units  151-200:  17 units    201-250:  19 units  251-300:  23 units  301-350:  27 units  351-400:  31 units  401 and greater:  35 units   Yes Historical Provider, MD  insulin detemir (LEVEMIR) 100 UNIT/ML injection Inject 40 Units into the skin at bedtime.   Yes Historical Provider, MD  ketoconazole (NIZORAL) 2 % cream Apply 1 application topically every 2 (two) hours as needed (for rash under breasts).    Yes Historical Provider, MD  metoprolol tartrate (LOPRESSOR) 25 MG tablet Take 25 mg by mouth 2 (two) times daily.    Yes Historical Provider, MD  Multiple Vitamin (MULTIVITAMIN WITH MINERALS) TABS tablet Take 1 tablet by mouth daily.   Yes Historical Provider, MD  oxybutynin (DITROPAN-XL) 10 MG 24 hr tablet Take 10 mg by mouth at bedtime.   Yes Historical Provider, MD  pantoprazole (PROTONIX) 40 MG tablet Take 40 mg by mouth 2 (two) times daily.   Yes Historical Provider, MD  PARoxetine (PAXIL) 20 MG tablet Take 20 mg by mouth daily.   Yes Historical Provider, MD  pravastatin (PRAVACHOL) 20 MG tablet Take 20 mg by mouth at bedtime.    Yes Historical Provider, MD  sucralfate (CARAFATE) 1 G tablet Take 1 g by mouth 2 (two) times daily.    Yes Historical Provider, MD  talc (ZEASORB) powder Apply 1 application topically as needed (for rash under breasts).   Yes Historical Provider, MD    Family History  Problem Relation Age of Onset  . Diabetes Mellitus II Mother   . CAD Mother      Social History  Substance Use Topics  . Smoking status: Never Smoker   . Smokeless tobacco: None  . Alcohol Use: No    Allergies as of 07/21/2015 - Review Complete 07/21/2015  Allergen Reaction Noted  . Cephalosporins Other (See Comments) 01/19/2015  . Hydrocodone-acetaminophen Hives 01/19/2015  . Macrolides and ketolides Other (See Comments) 01/19/2015  . Meperidine Shortness Of Breath, Nausea Only, and Other (See Comments) 01/19/2015  . Shellfish allergy Shortness Of Breath, Diarrhea, Nausea Only, Rash, and Other (See Comments)  06/29/2015  . Sulfa antibiotics Shortness Of Breath, Diarrhea, Nausea Only, and Other (See Comments) 01/19/2015  . Aspirin Other (See Comments) 01/19/2015  . Celecoxib Other (See Comments) 01/19/2015  . Cephalexin Diarrhea, Nausea Only, and Other (See Comments) 01/19/2015  . Erythromycin Diarrhea, Nausea Only, and Other (See Comments) 01/19/2015  . Hydromorphone Other (See Comments) 01/19/2015  . Iodinated diagnostic agents Rash and Other (See Comments) 01/19/2015  . Oxycodone Other (See Comments) 01/19/2015  . Atorvastatin Nausea Only and Other (See Comments) 01/19/2015  . Codeine Diarrhea, Nausea Only, and Other (See Comments) 01/19/2015  . Prednisone Other (See Comments) 01/19/2015  . Tape Other (See Comments) 06/29/2015  . Valdecoxib Swelling and Other (See Comments) 01/19/2015  . Iodine Rash 06/29/2015    Review of Systems:    All systems reviewed and negative except where  noted in HPI.   Physical Exam:  Vital signs in last 24 hours: Temp:  [98.3 F (36.8 C)-99.1 F (37.3 C)] 99.1 F (37.3 C) (11/10 1540) Pulse Rate:  [57-99] 64 (11/10 1540) Resp:  [18] 18 (11/10 0720) BP: (68-111)/(36-83) 111/61 mmHg (11/10 1540) SpO2:  [93 %-99 %] 99 % (11/10 1540) Weight:  [269 lb 10 oz (122.3 kg)] 269 lb 10 oz (122.3 kg) (11/10 0440) Last BM Date: 07/23/15 General:   Pleasant, cooperative in NAD Head:  Normocephalic and atraumatic. Eyes:   No icterus.   Conjunctiva pink. PERRLA. Ears:  Normal auditory acuity. Neck:  Supple; no masses or thyroidomegaly Lungs: Respirations even and unlabored. Lungs clear to auscultation bilaterally.   No wheezes, crackles, or rhonchi.  Heart:  Regular rate and rhythm;  Without murmur, clicks, rubs or gallops Abdomen:  Soft, nondistended, epigastric tenderness. Normal bowel sounds. No appreciable masses or hepatomegaly.  No rebound or guarding.  Rectal:  Not performed. Msk:  Symmetrical without gross deformities.   Extremities:  Without edema, cyanosis  or clubbing. Neurologic:  Alert and oriented x3;  grossly normal neurologically. Skin:  Intact without significant lesions or rashes. Cervical Nodes:  No significant cervical adenopathy. Psych:  Alert and cooperative. Normal affect.  LAB RESULTS:  Recent Labs  07/21/15 2033 07/23/15 0551  WBC 7.3 8.1  HGB 11.2* 9.3*  HCT 34.2* 28.9*  PLT 175 149*   BMET  Recent Labs  07/21/15 2033 07/23/15 0551  NA 139 138  K 3.8 4.0  CL 105 107  CO2 24 23  GLUCOSE 151* 137*  BUN 26* 22*  CREATININE 1.55* 1.58*  CALCIUM 8.2* 7.7*   LFT  Recent Labs  07/23/15 0551  PROT 5.6*  ALBUMIN 2.7*  AST 36  ALT 23  ALKPHOS 131*  BILITOT 3.3*  BILIDIR 1.9*  IBILI 1.4*   PT/INR No results for input(s): LABPROT, INR in the last 72 hours.  STUDIES: Ct Abdomen Pelvis Wo Contrast  07/22/2015  CLINICAL DATA:  Diffuse abdominal pain for 2 days EXAM: CT ABDOMEN AND PELVIS WITHOUT CONTRAST TECHNIQUE: Multidetector CT imaging of the abdomen and pelvis was performed following the standard protocol without IV contrast. COMPARISON:  02/20/2015 FINDINGS: Lung bases demonstrate small bilateral pleural effusions and scarring. No focal infiltrate is seen. Pacing device is noted in the right ventricle. The gallbladder has been surgically removed. The liver, spleen, adrenal glands and pancreas are within normal limits. The left kidney is well visualized and demonstrates no renal calculi or urinary tract obstructive changes. The left ureter is within normal limits. The right kidney show some mild perinephric stranding. The ureter appears within normal limits. Mild aortoiliac calcifications are noted without aneurysmal dilatation. Postsurgical changes are noted in the lower lumbar spine. The bladder is partially distended. The uterus has been surgically removed. Mild diverticular change of the colon is seen without diverticulitis. Changes of prior colonic resection are noted. IMPRESSION: Mild perinephric stranding  surrounding the right kidney. A localized infection could not be totally excluded on the basis of this exam. No obstructive changes are noted. Small bilateral pleural effusions. No other acute abnormality is seen Electronically Signed   By: Inez Catalina M.D.   On: 07/22/2015 16:23   Dg Chest 1 View  07/21/2015  CLINICAL DATA:  Chest pain and fever, onset this morning. EXAM: CHEST 1 VIEW COMPARISON:  07/01/2015 FINDINGS: Single lead left-sided pacemaker, tip projecting over the right ventricle. Cardiomediastinal contours are unchanged. There is no consolidation, large pleural effusion  or pneumothorax. Postsurgical change in the lower cervical spine, partially included. Limited assessment of the left lung base due to soft tissue attenuation from body habitus. IMPRESSION: No acute pulmonary process. Electronically Signed   By: Jeb Levering M.D.   On: 07/21/2015 21:10      Impression / Plan:   Deborah Obrien is a 74 y.o. y/o female with who was admitted with a recent onset of epigastric pain with chills and was diagnosed with a urinary tract infection. The patient continues to have abdominal pain only helped by a GI cocktail. The patient is on a PPI twice a day and on Carafate but still continues to have abdominal pain. The patient will be set up for an upper endoscopy for tomorrow. The patient will be nothing by mouth after tonight. I have discussed risks & benefits which include, but are not limited to, bleeding, infection, perforation & drug reaction.  The patient agrees with this plan & written consent will be obtained.      Thank you for involving me in the care of this patient.      LOS: 1 day   Ollen Bowl, MD  07/23/2015, 6:35 PM   Note: This dictation was prepared with Dragon dictation along with smaller phrase technology. Any transcriptional errors that result from this process are unintentional.

## 2015-07-23 NOTE — Progress Notes (Signed)
Called Dr Volanda Napoleon regarding pts continued epigastric pain/ full sensation and nausea . Orders received. Walked patient around the unit with hopes to move some gas. Phenergan given.

## 2015-07-23 NOTE — Progress Notes (Signed)
Pt Low BP reported to MD. Explained I held her amiodarone and Metoprolol this am. Orders given to bolus with NS.

## 2015-07-23 NOTE — Progress Notes (Signed)
Patient continues to feel slightly nauseated, no vomiting my shift, took PO pills without difficulty, Patient continues to have some epigastric pain, Patient NPO after MN going for EGD bc of abdominal pain tomorrow morning, long acting insulin help per MD sliding scale added.  No other complaints, reglan and norco given.

## 2015-07-23 NOTE — Progress Notes (Signed)
*  PRELIMINARY RESULTS* Echocardiogram 2D Echocardiogram has been performed.  Deborah Obrien 07/23/2015, 3:30 PM

## 2015-07-23 NOTE — Progress Notes (Addendum)
Deborah Obrien at [place] NAME: Deborah Obrien    MR#:  [id number]  DATE OF BIRTH:  05-14-1941  SUBJECTIVE:  CHIEF COMPLAINT:   Chief Complaint  Patient presents with  . Chest Pain  . Fever   Has had recurrence of abdominal pain, mid epigastric area accompanied by nausea  REVIEW OF SYSTEMS:   Review of Systems  Constitutional: Positive for fever, chills and malaise/fatigue.  Respiratory: Negative for shortness of breath.   Cardiovascular: Negative for chest pain and palpitations.  Gastrointestinal: Negative for nausea, vomiting and abdominal pain.  Genitourinary: Positive for dysuria, urgency and frequency.  Neurological: Positive for weakness.    DRUG ALLERGIES:   Allergies  Allergen Reactions  . Cephalosporins Other (See Comments)    Reaction:  Unknown   . Hydrocodone-Acetaminophen Hives  . Macrolides And Ketolides Other (See Comments)    Reaction:  Unknown   . Meperidine Shortness Of Breath, Nausea Only and Other (See Comments)    Reaction:  Stomach pain   . Shellfish Allergy Shortness Of Breath, Diarrhea, Nausea Only, Rash and Other (See Comments)    Reaction:  Stomach pain   . Sulfa Antibiotics Shortness Of Breath, Diarrhea, Nausea Only and Other (See Comments)    Reaction:  Stomach pain   . Aspirin Other (See Comments)    Reaction:  Stomach pain   . Celecoxib Other (See Comments)    Reaction:  GI bleed, weakness, and stomach pain.    . Cephalexin Diarrhea, Nausea Only and Other (See Comments)    Reaction:  Stomach pain   . Erythromycin Diarrhea, Nausea Only and Other (See Comments)    Reaction:  Stomach pain   . Hydromorphone Other (See Comments)    Reaction:  Hypotension   . Iodinated Diagnostic Agents Rash and Other (See Comments)    Pt states that she is unable to have because she has chronic kidney disease.    Marland Kitchen Oxycodone Other (See Comments)    Reaction:  Stomach pain   . Atorvastatin Nausea Only and  Other (See Comments)    Reaction:  Weakness   . Codeine Diarrhea, Nausea Only and Other (See Comments)    Reaction:  Stomach pain   . Prednisone Other (See Comments)    Reaction:  Increases pts blood sugar  Pt states that she is allergic to all steroids.    . Tape Other (See Comments)    Reaction:  Causes pts skin to tear  Pt states that she is able to use paper tape.    . Valdecoxib Swelling and Other (See Comments)    Pt states that her hands and feet swell.    . Iodine Rash    VITALS:  Blood pressure 111/61, pulse 64, temperature 99.1 F (37.3 C), temperature source Oral, resp. rate 18, height 5\' 4"  (1.626 m), weight 122.3 kg (269 lb 10 oz), SpO2 99 %.  PHYSICAL EXAMINATION:  GENERAL:  74 y.o.-year-old patient sitting up, eating breakfast, no distress obese LUNGS: Normal breath sounds bilaterally, no wheezing, rales,rhonchi or crepitation. No use of accessory muscles of respiration.  CARDIOVASCULAR: S1, S2 normal. No murmurs, rubs, or gallops.  ABDOMEN: Soft, nontender, nondistended. Bowel sounds present. No organomegaly or mass.  EXTREMITIES: No pedal edema, cyanosis, or clubbing.  NEUROLOGIC: Cranial nerves II through XII are intact. Muscle strength 5/5 in all extremities. Sensation intact. Gait not checked.  PSYCHIATRIC: The patient is alert and oriented x 3.  SKIN: No obvious rash, lesion,  or ulcer.    LABORATORY PANEL:   CBC  Recent Labs Lab 07/23/15 0551  WBC 8.1  HGB 9.3*  HCT 28.9*  PLT 149*   ------------------------------------------------------------------------------------------------------------------  Chemistries   Recent Labs Lab 07/23/15 0551  NA 138  K 4.0  CL 107  CO2 23  GLUCOSE 137*  BUN 22*  CREATININE 1.58*  CALCIUM 7.7*  AST 36  ALT 23  ALKPHOS 131*  BILITOT 3.3*   ------------------------------------------------------------------------------------------------------------------  Cardiac Enzymes  Recent Labs Lab  07/22/15 1252  TROPONINI <0.03   ------------------------------------------------------------------------------------------------------------------  RADIOLOGY:  Ct Abdomen Pelvis Wo Contrast  07/22/2015  CLINICAL DATA:  Diffuse abdominal pain for 2 days EXAM: CT ABDOMEN AND PELVIS WITHOUT CONTRAST TECHNIQUE: Multidetector CT imaging of the abdomen and pelvis was performed following the standard protocol without IV contrast. COMPARISON:  02/20/2015 FINDINGS: Lung bases demonstrate small bilateral pleural effusions and scarring. No focal infiltrate is seen. Pacing device is noted in the right ventricle. The gallbladder has been surgically removed. The liver, spleen, adrenal glands and pancreas are within normal limits. The left kidney is well visualized and demonstrates no renal calculi or urinary tract obstructive changes. The left ureter is within normal limits. The right kidney show some mild perinephric stranding. The ureter appears within normal limits. Mild aortoiliac calcifications are noted without aneurysmal dilatation. Postsurgical changes are noted in the lower lumbar spine. The bladder is partially distended. The uterus has been surgically removed. Mild diverticular change of the colon is seen without diverticulitis. Changes of prior colonic resection are noted. IMPRESSION: Mild perinephric stranding surrounding the right kidney. A localized infection could not be totally excluded on the basis of this exam. No obstructive changes are noted. Small bilateral pleural effusions. No other acute abnormality is seen Electronically Signed   By: Inez Catalina M.D.   On: 07/22/2015 16:23   Dg Chest 1 View  07/21/2015  CLINICAL DATA:  Chest pain and fever, onset this morning. EXAM: CHEST 1 VIEW COMPARISON:  07/01/2015 FINDINGS: Single lead left-sided pacemaker, tip projecting over the right ventricle. Cardiomediastinal contours are unchanged. There is no consolidation, large pleural effusion or  pneumothorax. Postsurgical change in the lower cervical spine, partially included. Limited assessment of the left lung base due to soft tissue attenuation from body habitus. IMPRESSION: No acute pulmonary process. Electronically Signed   By: Jeb Levering M.D.   On: 07/21/2015 21:10    EKG:   Orders placed or performed during the hospital encounter of 06/29/15  . EKG 12-Lead  . EKG 12-Lead  . EKG 12-Lead in am (before 8am)  . EKG 12-Lead in am (before 8am)    ASSESSMENT AND PLAN:   This is a 74 year old Caucasian female admitted for sepsis.  1. Sepsis:  - due to uti - Blood pressure has been low today but finally responding to fluids  #2 urinary tract infection - Urine growing pansensitive Escherichia coli - Continue vancomycin and Zosyn for now  3. Essential hypertension:  - Has been hypotensive, continue IV rehydration - Hold metoprolol, of note the dose of metoprolol was recently increased in outpatient setting  4. Diabetes mellitus type 2:  - Continue basal insulin, increased to 30. Sliding scale insulin while hospitalized  - A1c 7.0 good control in outpatient setting   5. 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  - Dose vancomycin renal function.  - Continue intravenous fluid  6. Abdominal pain - Likely due to reflux, resolved with GI cocktail, hepatic function and lipase are normal,  abdominal CT scan is normal - Continue PPI and Reglan  7. Atrial fibrillation: Continue amiodarone 8. Depression/anxiety: Continue paroxetine 9. DVT prophylaxis: Heparin 10. GI prophylaxis: Pantoprazole  All the records are reviewed and case discussed with Care Management/Social Workerr. Management plans discussed with the patient, family and they are in agreement.  CODE STATUS: Full  TOTAL TIME TAKING CARE OF THIS PATIENT: 25 minutes.  Greater than 50% of time spent in care coordination and counseling. POSSIBLE D/C IN 1-2 DAYS, DEPENDING ON CLINICAL CONDITION.   Myrtis Ser M.D on 07/23/2015 at 4:02 PM  Between 7am to 6pm - Pager - [phone]  After 6pm go to [email or web address] - password [email or web address] [facility]  Office  [phone]  CC: Primary care physician; [name]., MD

## 2015-07-24 ENCOUNTER — Inpatient Hospital Stay: Payer: Medicare Other | Admitting: Anesthesiology

## 2015-07-24 ENCOUNTER — Encounter
Admission: EM | Disposition: A | Discharge status: Home Health | Payer: Self-pay | Source: Home / Self Care | Attending: Internal Medicine

## 2015-07-24 ENCOUNTER — Encounter: Payer: Self-pay | Admitting: *Deleted

## 2015-07-24 DIAGNOSIS — K2970 Gastritis, unspecified, without bleeding: Secondary | ICD-10-CM | POA: Insufficient documentation

## 2015-07-24 DIAGNOSIS — K317 Polyp of stomach and duodenum: Secondary | ICD-10-CM

## 2015-07-24 HISTORY — PX: ESOPHAGOGASTRODUODENOSCOPY (EGD) WITH PROPOFOL: SHX5813

## 2015-07-24 LAB — BASIC METABOLIC PANEL
Anion gap: 13 (ref 5–15)
BUN: 24 mg/dL — AB (ref 6–20)
CO2: 17 mmol/L — ABNORMAL LOW (ref 22–32)
CREATININE: 1.78 mg/dL — AB (ref 0.44–1.00)
Calcium: 8.1 mg/dL — ABNORMAL LOW (ref 8.9–10.3)
Chloride: 109 mmol/L (ref 101–111)
GFR, EST AFRICAN AMERICAN: 31 mL/min — AB (ref >60)
GFR, EST NON AFRICAN AMERICAN: 27 mL/min — AB (ref >60)
Glucose, Bld: 151 mg/dL — ABNORMAL HIGH (ref 65–99)
POTASSIUM: 4.7 mmol/L (ref 3.5–5.1)
SODIUM: 139 mmol/L (ref 135–145)

## 2015-07-24 LAB — GLUCOSE, CAPILLARY
Glucose-Capillary: 181 mg/dL — ABNORMAL HIGH (ref 65–99)
Glucose-Capillary: 171 mg/dL — ABNORMAL HIGH (ref 65–99)
Glucose-Capillary: 136 mg/dL — ABNORMAL HIGH (ref 65–99)
Glucose-Capillary: 119 mg/dL — ABNORMAL HIGH (ref 65–99)
Glucose-Capillary: 175 mg/dL — ABNORMAL HIGH (ref 65–99)

## 2015-07-24 LAB — CBC
HCT: 31.7 % — ABNORMAL LOW (ref 35.0–47.0)
Hemoglobin: 10.2 g/dL — ABNORMAL LOW (ref 12.0–16.0)
MCH: 27.5 pg (ref 26.0–34.0)
MCHC: 32.2 g/dL (ref 32.0–36.0)
MCV: 85.2 fL (ref 80.0–100.0)
PLATELETS: 155 10*3/uL (ref 150–440)
RBC: 3.73 MIL/uL — AB (ref 3.80–5.20)
RDW: 18 % — ABNORMAL HIGH (ref 11.5–14.5)
WBC: 7.4 10*3/uL (ref 3.6–11.0)

## 2015-07-24 LAB — TROPONIN I

## 2015-07-24 SURGERY — ESOPHAGOGASTRODUODENOSCOPY (EGD) WITH PROPOFOL
Anesthesia: General

## 2015-07-24 MED ORDER — AMOXICILLIN 500 MG PO CAPS
500.0000 mg | ORAL_CAPSULE | Freq: Two times a day (BID) | ORAL | Status: DC
  Administered 2015-07-24 – 2015-07-25 (×3): 500 mg via ORAL
  Filled 2015-07-24 (×3): qty 1

## 2015-07-24 MED ORDER — PROPOFOL 10 MG/ML IV BOLUS
INTRAVENOUS | Status: DC | PRN
  Administered 2015-07-24: 50 mg via INTRAVENOUS
  Administered 2015-07-24: 20 mg via INTRAVENOUS

## 2015-07-24 MED ORDER — AMIODARONE HCL 200 MG PO TABS
200.0000 mg | ORAL_TABLET | Freq: Every day | ORAL | Status: DC
  Administered 2015-07-24 – 2015-07-25 (×2): 200 mg via ORAL
  Filled 2015-07-24 (×2): qty 1

## 2015-07-24 MED ORDER — PROPOFOL 500 MG/50ML IV EMUL
INTRAVENOUS | Status: DC | PRN
Start: 2015-07-24 — End: 2015-07-24
  Administered 2015-07-24: 100 ug/kg/min via INTRAVENOUS

## 2015-07-24 MED ORDER — LIDOCAINE HCL (CARDIAC) 20 MG/ML IV SOLN
INTRAVENOUS | Status: DC | PRN
  Administered 2015-07-24: 30 mg via INTRAVENOUS

## 2015-07-24 NOTE — Progress Notes (Signed)
Pt wen to to EGD today with no complications. Pts VSS, reports no pain, A&O X4. Plan to d/c tomorrow home with self care. No falls or skin breakdown. Will continue to monitor.

## 2015-07-24 NOTE — Anesthesia Preprocedure Evaluation (Addendum)
Anesthesia Evaluation  Patient identified by MRN, date of birth, ID band Patient awake    Reviewed: Allergy & Precautions, H&P , NPO status , Patient's Chart, lab work & pertinent test results  History of Anesthesia Complications Negative for: history of anesthetic complications  Airway Mallampati: III  TM Distance: >3 FB Neck ROM: limited    Dental  (+) Poor Dentition, Missing, Chipped, Edentulous Upper, Upper Dentures   Pulmonary shortness of breath and with exertion,    Pulmonary exam normal breath sounds clear to auscultation       Cardiovascular Exercise Tolerance: Poor hypertension, (-) angina+ DOE  (-) Past MI Normal cardiovascular exam+ pacemaker  Rhythm:regular Rate:Normal     Neuro/Psych negative neurological ROS  negative psych ROS   GI/Hepatic negative GI ROS, Neg liver ROS, GERD  Controlled,  Endo/Other  diabetes, Poorly Controlled, Type 2, Insulin Dependent  Renal/GU ESRFRenal disease  negative genitourinary   Musculoskeletal   Abdominal   Peds  Hematology negative hematology ROS (+)   Anesthesia Other Findings Past Medical History:   Diabetes mellitus without complication (HCC)                 Hypertension                                                 Morbid obesity (HCC)                                         Chronic kidney disease (CKD) stage G3a/A1, mod*              Paroxysmal atrial fibrillation ([facility])                        Past Surgical History:   SHOULDER ARTHROSCOPY WITH SUBACROMIAL DECOMPRE* Left              ESOPHAGOGASTRODUODENOSCOPY                      N/A 01/27/2015      Comment:Procedure: ESOPHAGOGASTRODUODENOSCOPY (EGD);                Surgeon: Lollie Sails, MD;  [facility]              ENDOSCOPY;  Service: Endoscopy;  Laterality:               N/A;   COLON SURGERY                                                BMI    Body Mass Index   44.63 kg/m 2    Reproductive/Obstetrics negative OB ROS                            Anesthesia Physical  Anesthesia Plan  ASA: IV  Anesthesia Plan: General   Post-op Pain Management:    Induction:   Airway Management Planned:   Additional Equipment:   Intra-op Plan:   Post-operative Plan:   Informed Consent: I have reviewed the patients History  and Physical, chart, labs and discussed the procedure including the risks, benefits and alternatives for the proposed anesthesia with the patient or authorized representative who has indicated his/her understanding and acceptance.   Dental Advisory Given  Plan Discussed with: Anesthesiologist, CRNA and Surgeon  Anesthesia Plan Comments:         Anesthesia Quick Evaluation

## 2015-07-24 NOTE — Op Note (Signed)
[facility] Patient Name: Deborah Obrien Procedure Date: 07/24/2015 3:29 PM MRN: [id number] Account #: [id number] Date of Birth: October 07, 1940 Admit Type: Inpatient Age: 74 Room: [facility] ENDO ROOM 4 Gender: Female Note Status: Finalized Procedure:         Upper GI endoscopy Indications:       Epigastric abdominal pain Providers:         Lucilla Lame, MD Referring MD:      Ocie Cornfield. Ouida Sills, MD (Referring MD) Medicines:         Propofol per Anesthesia Procedure:         Pre-Anesthesia Assessment:                    - Prior to the procedure, a History and Physical was                     performed, and patient medications and allergies were                     reviewed. The patient's tolerance of previous anesthesia                     was also reviewed. The risks and benefits of the procedure                     and the sedation options and risks were discussed with the                     patient. All questions were answered, and informed consent                     was obtained. Prior Anticoagulants: The patient has taken                     no previous anticoagulant or antiplatelet agents. ASA                     Grade Assessment: II - A patient with mild systemic                     disease. After reviewing the risks and benefits, the                     patient was deemed in satisfactory condition to undergo                     the procedure.                    After obtaining informed consent, the endoscope was passed                     under direct vision. Throughout the procedure, the                     patient's blood pressure, pulse, and oxygen saturations                     were monitored continuously. The Endoscope was introduced                     through the mouth, and advanced to the second part of                     duodenum.  The upper GI endoscopy was accomplished without                     difficulty. The patient tolerated  the procedure well. Findings:      The examined esophagus was normal.      Localized moderate inflammation characterized by erythema was found in       the gastric antrum.      Multiple 4 to 15 mm sessile polyps with no bleeding and no stigmata of       recent bleeding were found in the entire examined stomach. This was       biopsied with a cold forceps for histology.      An endoclip was found in the gastric antrum.      The examined duodenum was normal. Impression:        - Normal esophagus.                    - Gastritis.                    - Multiple gastric polyps. Biopsied.                    - An endoclip was found in the stomach.                    - Normal examined duodenum. Recommendation:    - Continue present medications. Procedure Code(s): --- Professional ---                    [phone], Esophagogastroduodenoscopy, flexible, transoral;                     with biopsy, single or multiple Diagnosis Code(s): --- Professional ---                    R10.13, Epigastric pain                    K29.70, Gastritis, unspecified, without bleeding                    K31.7, Polyp of stomach and duodenum CPT copyright 2014 [employer]. All rights reserved. The codes documented in this report are preliminary and upon coder review may  be revised to meet current compliance requirements. Lucilla Lame, MD 07/24/2015 3:42:55 PM This report has been signed electronically. Number of Addenda: 0 Note Initiated On: 07/24/2015 3:29 PM      [facility]

## 2015-07-24 NOTE — Plan of Care (Signed)
Problem: Activity: Goal: Risk for activity intolerance will decrease Outcome: Completed/Met Date Met:  07/24/15 Patient ambulates to BR with 1 assist

## 2015-07-24 NOTE — Progress Notes (Signed)
[place] [facility] [place] NAME: Deborah Obrien    MR#:  [id number]  DATE OF BIRTH:  04/06/1941  SUBJECTIVE: This abdominal pain better . Going for  endoscopy. Dysuria, frequency improved.   CHIEF COMPLAINT:   Chief Complaint  Patient presents with  . Chest Pain  . Fever   Has had recurrence of abdominal pain, mid epigastric area accompanied by nausea  REVIEW OF SYSTEMS:   Review of Systems  Constitutional: Positive for malaise/fatigue. Negative for fever and chills.  Respiratory: Negative for shortness of breath.   Cardiovascular: Negative for chest pain and palpitations.  Gastrointestinal: Negative for nausea, vomiting and abdominal pain.  Genitourinary: Negative for dysuria, urgency and frequency.  Neurological: Negative for weakness.    DRUG ALLERGIES:   Allergies  Allergen Reactions  . Cephalosporins Other (See Comments)    Reaction:  Unknown   . Hydrocodone-Acetaminophen Hives  . Macrolides And Ketolides Other (See Comments)    Reaction:  Unknown   . Meperidine Shortness Of Breath, Nausea Only and Other (See Comments)    Reaction:  Stomach pain   . Shellfish Allergy Shortness Of Breath, Diarrhea, Nausea Only, Rash and Other (See Comments)    Reaction:  Stomach pain   . Sulfa Antibiotics Shortness Of Breath, Diarrhea, Nausea Only and Other (See Comments)    Reaction:  Stomach pain   . Aspirin Other (See Comments)    Reaction:  Stomach pain   . Celecoxib Other (See Comments)    Reaction:  GI bleed, weakness, and stomach pain.    . Cephalexin Diarrhea, Nausea Only and Other (See Comments)    Reaction:  Stomach pain   . Erythromycin Diarrhea, Nausea Only and Other (See Comments)    Reaction:  Stomach pain   . Hydromorphone Other (See Comments)    Reaction:  Hypotension   . Iodinated Diagnostic Agents Rash and Other (See Comments)    Pt states that she is unable to have because she has chronic kidney disease.    Marland Kitchen  Oxycodone Other (See Comments)    Reaction:  Stomach pain   . Atorvastatin Nausea Only and Other (See Comments)    Reaction:  Weakness   . Codeine Diarrhea, Nausea Only and Other (See Comments)    Reaction:  Stomach pain   . Prednisone Other (See Comments)    Reaction:  Increases pts blood sugar  Pt states that she is allergic to all steroids.    . Tape Other (See Comments)    Reaction:  Causes pts skin to tear  Pt states that she is able to use paper tape.    . Valdecoxib Swelling and Other (See Comments)    Pt states that her hands and feet swell.    . Iodine Rash    VITALS:  Blood pressure 104/34, pulse 59, temperature 97.8 F (36.6 C), temperature source Oral, resp. rate 18, height 5\' 4"  (1.626 m), weight 127.914 kg (282 lb), SpO2 96 %.  PHYSICAL EXAMINATION:  GENERAL:  74 y.o.-year-old patient sitting up, eating breakfast, no distress obese LUNGS: Normal breath sounds bilaterally, no wheezing, rales,rhonchi or crepitation. No use of accessory muscles of respiration.  CARDIOVASCULAR: S1, S2 normal. No murmurs, rubs, or gallops.  ABDOMEN: Midepigastric tenderness present no rebound tenderness, no organomegaly EXTREMITIES: No pedal edema, cyanosis, or clubbing.  NEUROLOGIC: Cranial nerves II through XII are intact. Muscle strength 5/5 in all extremities. Sensation intact. Gait not checked.  PSYCHIATRIC: The patient is alert  and oriented x 3.  SKIN: No obvious rash, lesion, or ulcer.    LABORATORY PANEL:   CBC  Recent Labs Lab 07/24/15 0554  WBC 7.4  HGB 10.2*  HCT 31.7*  PLT 155   ------------------------------------------------------------------------------------------------------------------  Chemistries   Recent Labs Lab 07/23/15 0551  NA 138  K 4.0  CL 107  CO2 23  GLUCOSE 137*  BUN 22*  CREATININE 1.58*  CALCIUM 7.7*  AST 36  ALT 23  ALKPHOS 131*  BILITOT 3.3*    ------------------------------------------------------------------------------------------------------------------  Cardiac Enzymes  Recent Labs Lab 07/23/15 2327  TROPONINI <0.03   ------------------------------------------------------------------------------------------------------------------  RADIOLOGY:  Ct Abdomen Pelvis Wo Contrast  07/22/2015  CLINICAL DATA:  Diffuse abdominal pain for 2 days EXAM: CT ABDOMEN AND PELVIS WITHOUT CONTRAST TECHNIQUE: Multidetector CT imaging of the abdomen and pelvis was performed following the standard protocol without IV contrast. COMPARISON:  02/20/2015 FINDINGS: Lung bases demonstrate small bilateral pleural effusions and scarring. No focal infiltrate is seen. Pacing device is noted in the right ventricle. The gallbladder has been surgically removed. The liver, spleen, adrenal glands and pancreas are within normal limits. The left kidney is well visualized and demonstrates no renal calculi or urinary tract obstructive changes. The left ureter is within normal limits. The right kidney show some mild perinephric stranding. The ureter appears within normal limits. Mild aortoiliac calcifications are noted without aneurysmal dilatation. Postsurgical changes are noted in the lower lumbar spine. The bladder is partially distended. The uterus has been surgically removed. Mild diverticular change of the colon is seen without diverticulitis. Changes of prior colonic resection are noted. IMPRESSION: Mild perinephric stranding surrounding the right kidney. A localized infection could not be totally excluded on the basis of this exam. No obstructive changes are noted. Small bilateral pleural effusions. No other acute abnormality is seen Electronically Signed   By: Inez Catalina M.D.   On: 07/22/2015 16:23    EKG:   Orders placed or performed during the hospital encounter of 06/29/15  . EKG 12-Lead  . EKG 12-Lead  . EKG 12-Lead in am (before 8am)  . EKG 12-Lead in  am (before 8am)    ASSESSMENT AND PLAN:   This is a 74 year old Caucasian female admitted for sepsis.  1. Sepsis:  - due to uti - Stable, continue IV hydration. bp is improving.  #2 urinary tract infection - Urine growing pansensitive Escherichia coli - Continue vancomycin and Zosyn for now  3. Essential hypertension:  - Has been hypotensive, continue IV rehydration - Hold metoprolol, of note the dose of metoprolol was recently increased in outpatient setting  4. Diabetes mellitus type 2:  - Continue basal insulin, increased to 30. Sliding scale insulin while hospitalized  - A1c 7.0 good control in outpatient setting   5. 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  - Dose vancomycin renal function.  - Continue intravenous fluid  6. Abdominal pain - Likely due to reflux, resolved with GI cocktail, hepatic function and lipase are normal, abdominal CT scan is normal - Continue PPI and Reglan Patient is scheduled to have EGD today. 7. Anemia acute on chronic; likely secondary to anemia of chronic disease versus slow GI bleed; EGD today. Continue PPIs.  7. Atrial fibrillation: Continue amiodarone 8. Depression/anxiety: Continue paroxetine 9. DVT prophylaxis: Heparin 10. GI prophylaxis: Pantoprazole  All the records are reviewed and case discussed with Care Management/Social Workerr. Management plans discussed with the patient, family and they are in agreement.  CODE STATUS: Full  TOTAL TIME  TAKING CARE OF THIS PATIENT: 25 minutes.   POSSIBLE D/C IN 1-2 DAYS, DEPENDING ON CLINICAL CONDITION.   Epifanio Lesches M.D on 07/24/2015 at 7:42 AM  Between 7am to 6pm - Pager - [phone]  After 6pm go to [email or web address] - password [email or web address] [place] [facility]  Office  [phone]  CC: Primary care physician; [name]., MD

## 2015-07-24 NOTE — Progress Notes (Signed)
ANTIBIOTIC CONSULT NOTE - INITIAL  Pharmacy Consult for Amoxicillin Indication: Pan-sensitive Ecoli UTI  Allergies  Allergen Reactions  . Cephalosporins Other (See Comments)    Reaction:  Unknown   . Hydrocodone-Acetaminophen Hives  . Macrolides And Ketolides Other (See Comments)    Reaction:  Unknown   . Meperidine Shortness Of Breath, Nausea Only and Other (See Comments)    Reaction:  Stomach pain   . Shellfish Allergy Shortness Of Breath, Diarrhea, Nausea Only, Rash and Other (See Comments)    Reaction:  Stomach pain   . Sulfa Antibiotics Shortness Of Breath, Diarrhea, Nausea Only and Other (See Comments)    Reaction:  Stomach pain   . Aspirin Other (See Comments)    Reaction:  Stomach pain   . Celecoxib Other (See Comments)    Reaction:  GI bleed, weakness, and stomach pain.    . Cephalexin Diarrhea, Nausea Only and Other (See Comments)    Reaction:  Stomach pain   . Erythromycin Diarrhea, Nausea Only and Other (See Comments)    Reaction:  Stomach pain   . Hydromorphone Other (See Comments)    Reaction:  Hypotension   . Iodinated Diagnostic Agents Rash and Other (See Comments)    Pt states that she is unable to have because she has chronic kidney disease.    Marland Kitchen Oxycodone Other (See Comments)    Reaction:  Stomach pain   . Atorvastatin Nausea Only and Other (See Comments)    Reaction:  Weakness   . Codeine Diarrhea, Nausea Only and Other (See Comments)    Reaction:  Stomach pain   . Prednisone Other (See Comments)    Reaction:  Increases pts blood sugar  Pt states that she is allergic to all steroids.    . Tape Other (See Comments)    Reaction:  Causes pts skin to tear  Pt states that she is able to use paper tape.    . Valdecoxib Swelling and Other (See Comments)    Pt states that her hands and feet swell.    . Iodine Rash    Patient Measurements: Height: 5\' 4"  (162.6 cm) Weight: 282 lb (127.914 kg) IBW/kg (Calculated) : 54.7  Vital Signs: Temp: 97.7 F (36.5  C) (11/11 0809) Temp Source: Oral (11/11 0809) BP: 103/77 mmHg (11/11 0809) Pulse Rate: 60 (11/11 0809) Intake/Output from previous day: 11/10 0701 - 11/11 0700 In: 2529.2 [P.O.:600; I.V.:1729.2; IV Piggyback:200] Out: 427 [Urine:425; Emesis/NG output:2] Intake/Output from this shift:    Labs:  Recent Labs  07/21/15 2033 07/23/15 0551 07/24/15 0554  WBC 7.3 8.1 7.4  HGB 11.2* 9.3* 10.2*  PLT 175 149* 155  CREATININE 1.55* 1.58* 1.78*   Estimated Creatinine Clearance: 36.8 mL/min (by C-G formula based on Cr of 1.78).   Microbiology: Recent Results (from the past 720 hour(s))  MRSA PCR Screening     Status: None   Collection Time: 06/29/15 12:18 PM  Result Value Ref Range Status   MRSA by PCR NEGATIVE NEGATIVE Final    Comment:        The GeneXpert MRSA Assay (FDA approved for NASAL specimens only), is one component of a comprehensive MRSA colonization surveillance program. It is not intended to diagnose MRSA infection nor to guide or monitor treatment for MRSA infections.   Blood culture (routine x 2)     Status: None (Preliminary result)   Collection Time: 07/21/15  8:32 PM  Result Value Ref Range Status   Specimen Description BLOOD RIGHT HAND  Final   Special Requests   Final    BOTTLES DRAWN AEROBIC AND ANAEROBIC 5CC AERO, 3CC ANA   Culture NO GROWTH 3 DAYS  Final   Report Status PENDING  Incomplete  Blood culture (routine x 2)     Status: None (Preliminary result)   Collection Time: 07/21/15  8:55 PM  Result Value Ref Range Status   Specimen Description BLOOD LEFT HAND  Final   Special Requests BOTTLES DRAWN AEROBIC AND ANAEROBIC 5CC  Final   Culture NO GROWTH 3 DAYS  Final   Report Status PENDING  Incomplete  Urine culture     Status: None   Collection Time: 07/21/15  9:39 PM  Result Value Ref Range Status   Specimen Description URINE, CLEAN CATCH  Final   Special Requests Normal  Final   Culture >=100,000 COLONIES/mL ESCHERICHIA COLI  Final    Report Status 07/23/2015 FINAL  Final   Organism ID, Bacteria ESCHERICHIA COLI  Final      Susceptibility   Escherichia coli - MIC*    AMPICILLIN <=2 SENSITIVE Sensitive     CEFAZOLIN <=4 SENSITIVE Sensitive     CEFTRIAXONE <=1 SENSITIVE Sensitive     CIPROFLOXACIN <=0.25 SENSITIVE Sensitive     GENTAMICIN <=1 SENSITIVE Sensitive     IMIPENEM <=0.25 SENSITIVE Sensitive     NITROFURANTOIN <=16 SENSITIVE Sensitive     TRIMETH/SULFA <=20 SENSITIVE Sensitive     PIP/TAZO Value in next row Sensitive      SENSITIVE<=4    * >=100,000 COLONIES/mL ESCHERICHIA COLI    Medical History: Past Medical History  Diagnosis Date  . Diabetes mellitus without complication ([place])   . Hypertension   . Morbid obesity ([place])   . Chronic kidney disease (CKD) stage G3a/A1, moderately decreased glomerular filtration rate (GFR) between 45-59 mL/min/1.73 square meter and albuminuria creatinine ratio less than 30 mg/g   . Paroxysmal atrial fibrillation ([facility])     Medications:  Prescriptions prior to admission  Medication Sig Dispense Refill Last Dose  . amiodarone (PACERONE) 400 MG tablet Take 1 tablet (400 mg total) by mouth daily. 30 tablet 0 07/21/2015 at Unknown time  . APPLE CIDER VINEGAR PO Take 1 tablet by mouth 2 (two) times daily.   07/21/2015 at Unknown time  . aspirin EC 81 MG tablet Take 81 mg by mouth at bedtime.   07/20/2015 at [place]   . Calcium Carbonate-Vitamin D (CALCIUM 600+D) 600-400 MG-UNIT tablet Take 1 tablet by mouth 2 (two) times daily.   07/21/2015 at Unknown time  . Cholecalciferol (VITAMIN D3) 400 UNITS tablet Take 400-800 Units by mouth 2 (two) times daily. Pt takes two tablets in the morning and one tablet at bedtime.   07/21/2015 at Unknown time  . CINNAMON PO Take 2 tablets by mouth 2 (two) times daily.   07/21/2015 at Unknown time  . Cranberry-Vitamin C (AZO CRANBERRY URINARY TRACT) 250-60 MG CAPS Take 2 capsules by mouth 2 (two) times daily.   07/21/2015 at Unknown time  . docusate  sodium (COLACE) 100 MG capsule Take 200 mg by mouth at bedtime.   07/20/2015 at Unknown time  . furosemide (LASIX) 40 MG tablet Take 40 mg by mouth daily as needed for edema.   07/21/2015 at Unknown time  . hydrocortisone 2.5 % cream Apply 1 application topically at bedtime as needed (for rash under breasts).    07/20/2015 at Unknown time  . insulin aspart (NOVOLOG) 100 UNIT/ML injection Inject 15-35 Units into the skin 3 (  three) times daily before meals. Pt uses as needed per sliding scale:    0-150:  15 units  151-200:  17 units  201-250:  19 units  251-300:  23 units  301-350:  27 units  351-400:  31 units  401 and greater:  35 units   07/21/2015 at Unknown time  . insulin detemir (LEVEMIR) 100 UNIT/ML injection Inject 40 Units into the skin at bedtime.   07/20/2015 at Unknown time  . ketoconazole (NIZORAL) 2 % cream Apply 1 application topically every 2 (two) hours as needed (for rash under breasts).    07/21/2015 at Unknown time  . metoprolol tartrate (LOPRESSOR) 25 MG tablet Take 25 mg by mouth 2 (two) times daily.    07/21/2015 at [place]   . Multiple Vitamin (MULTIVITAMIN WITH MINERALS) TABS tablet Take 1 tablet by mouth daily.   07/21/2015 at Unknown time  . oxybutynin (DITROPAN-XL) 10 MG 24 hr tablet Take 10 mg by mouth at bedtime.   07/20/2015 at Unknown time  . pantoprazole (PROTONIX) 40 MG tablet Take 40 mg by mouth 2 (two) times daily.   07/21/2015 at Unknown time  . PARoxetine (PAXIL) 20 MG tablet Take 20 mg by mouth daily.   07/21/2015 at Unknown time  . pravastatin (PRAVACHOL) 20 MG tablet Take 20 mg by mouth at bedtime.    07/20/2015 at Unknown time  . sucralfate (CARAFATE) 1 G tablet Take 1 g by mouth 2 (two) times daily.    07/21/2015 at Unknown time  . talc (ZEASORB) powder Apply 1 application topically as needed (for rash under breasts).   07/21/2015 at Unknown time   Assessment: Deborah Obrien is a 73yo female admitted for sepsis found to have a pan-sensitive Ecoli UTI. Previously on Vanc and  Zosyn.  Plan: D/C Vanc and Zosyn and initiate Amoxicillin 500mg  PO BID (planned course of 7 days total, with 5 more days of treatment due to 2 days of Zosyn therapy).  Of note, patient has many documented antibiotic allergies, including cephalosporins, however she has tolerated Zosyn well over the last several days.   Pharmacy to continue to monitor for signs and symptoms of infection resolution.   Vena Rua 07/24/2015,9:29 AM

## 2015-07-24 NOTE — Transfer of Care (Signed)
Immediate Anesthesia Transfer of Care Note  Patient: Deborah Obrien  Procedure(s) Performed: Procedure(s): ESOPHAGOGASTRODUODENOSCOPY (EGD) WITH PROPOFOL (N/A)  Patient Location: Endoscopy Unit  Anesthesia Type:General  Level of Consciousness: awake  Airway & Oxygen Therapy: Patient Spontanous Breathing and Patient connected to nasal cannula oxygen  Post-op Assessment: Report given to RN  Post vital signs: Reviewed  Last Vitals:  Filed Vitals:   07/24/15 1549  BP: 107/57  Pulse: 61  Temp: 36.3 C  Resp: 18    Complications: No apparent anesthesia complications

## 2015-07-25 LAB — GLUCOSE, CAPILLARY
Glucose-Capillary: 163 mg/dL — ABNORMAL HIGH (ref 65–99)
Glucose-Capillary: 238 mg/dL — ABNORMAL HIGH (ref 65–99)

## 2015-07-25 MED ORDER — ALPRAZOLAM 0.5 MG PO TABS: 0.5000 mg | ORAL_TABLET | Freq: Every evening | ORAL | Status: DC | PRN

## 2015-07-25 MED ORDER — METOPROLOL TARTRATE 25 MG PO TABS: 12.5000 mg | ORAL_TABLET | Freq: Every day | ORAL | Status: DC

## 2015-07-25 MED ORDER — AMOXICILLIN 500 MG PO CAPS: 500.0000 mg | ORAL_CAPSULE | Freq: Two times a day (BID) | ORAL | Status: DC

## 2015-07-25 NOTE — Discharge Summary (Signed)
Deborah Obrien, is a 74 y.o. female  DOB 1941-06-01  MRN [id number].  Admission date:  07/21/2015  Admitting Physician  Harrie Foreman, MD  Discharge Date:  07/25/2015   Primary MD  [name]., MD  Recommendations for primary care physician for things to follow: follow up with [facility] in 2-3 weeks   Admission Diagnosis  Cystitis [N30.90] Sepsis, due to unspecified organism [facility]) [A41.9] Chest pain, unspecified chest pain type [R07.9]   Discharge Diagnosis  Cystitis [N30.90] Sepsis, due to unspecified organism [facility]) [A41.9] Chest pain, unspecified chest pain type [R07.9]    Active Problems:   Sepsis ([place])   Esophageal reflux   Abdominal pain, epigastric   Gastritis   Gastric polyp      Past Medical History  Diagnosis Date  . Diabetes mellitus without complication ([place])   . Hypertension   . Morbid obesity ([place])   . Chronic kidney disease (CKD) stage G3a/A1, moderately decreased glomerular filtration rate (GFR) between 45-59 mL/min/1.73 square meter and albuminuria creatinine ratio less than 30 mg/g   . Paroxysmal atrial fibrillation [facility])     Past Surgical History  Procedure Laterality Date  . Shoulder arthroscopy with subacromial decompression Left   . Esophagogastroduodenoscopy N/A 01/27/2015    Procedure: ESOPHAGOGASTRODUODENOSCOPY (EGD);  Surgeon: Lollie Sails, MD;  Location: [facility] ENDOSCOPY;  Service: Endoscopy;  Laterality: N/A;  . Colon surgery    . 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/2015    Procedure: INSERTION PACEMAKER;  Surgeon: Isaias Cowman, MD;  Location: [facility] ORS;  Service: Cardiovascular;  Laterality: Left;       History of present illness and  Hospital Course:     Kindly see H&P for history of present illness and admission details, please review complete Labs, Consult  reports and Test reports for all details in brief  HPI  from the history and physical done on the day of admission  74 yr old female with chronic afib,htn,admitted for  Chills,malaise and abdominal pain.admitted for sepsis.pt was tachycardia and fever, Hospital Course   1.Sepsis ;due to UTI.urine culture showed Ecoli UTI,intially received vanco and zosyn .after culture date is  Available we narrowed down abx coverage. ,then discharged home with amoxicillin 2.Abdominal pain ,Anemia;started on PPI,GI consult  Obtained.had EGD on  Nov 11th.EGD showed normal esophagus,gastritis.multiple gastric polyps s/p endo clips,abdominal pain resolved with PPI. 3.DMII:good control as out pt setting.continued on basal insulin,sliding scale coverage, 4,chronic afib;on amiodarone and ASA 5.Depression/anxiety;on paxil,requested Xanax at night, gave 10 tablets prescription of xanax. 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   Specialty:  Internal Medicine   Contact information:   [address] [place] [address] [phone]         Discharge Instructions  and  Discharge Medications      Medication List    TAKE these medications        ALPRAZolam 0.5 MG tablet  Commonly known as:  XANAX  Take 1 tablet (0.5 mg total) by mouth at bedtime as needed for anxiety or sleep.     amiodarone 400 MG tablet  Commonly known as:  PACERONE  Take 1 tablet (400 mg total) by mouth daily.     amoxicillin 500 MG capsule  Commonly known as:  AMOXIL  Take 1 capsule (500 mg total) by mouth 2 (two) times daily.     APPLE CIDER VINEGAR PO  Take 1 tablet by mouth 2 (two) times daily.     aspirin EC 81 MG tablet  Take 81 mg by mouth at bedtime.     AZO  CRANBERRY URINARY TRACT 250-60 MG Caps  Generic drug:  Cranberry-Vitamin C  Take 2 capsules by mouth 2 (two) times daily.     CALCIUM 600+D 600-400 MG-UNIT tablet  Generic drug:  Calcium Carbonate-Vitamin D  Take 1 tablet by mouth 2 (two) times daily.     CINNAMON PO  Take 2 tablets by mouth 2 (two) times daily.     docusate sodium 100 MG capsule  Commonly known as:  COLACE  Take 200 mg by mouth at bedtime.     furosemide 40 MG tablet  Commonly known as:  LASIX  Take 40 mg by mouth daily as needed for edema.     hydrocortisone 2.5 % cream  Apply 1 application topically at bedtime as needed (for rash under breasts).     insulin aspart 100 UNIT/ML injection  Commonly known as:  novoLOG  Inject 15-35 Units into the skin 3 (three) times daily before meals. Pt uses as needed per sliding scale:    0-150:  15 units  151-200:  17 units  201-250:  19 units  251-300:  23 units  301-350:  27 units  351-400:  31 units  401 and greater:  35 units     insulin detemir 100 UNIT/ML injection  Commonly known as:  LEVEMIR  Inject 40 Units into the skin at bedtime.     ketoconazole 2 % cream  Commonly known as:  NIZORAL  Apply 1 application topically every 2 (two) hours as needed (for rash under breasts).     metoprolol tartrate 25 MG tablet  Commonly known as:  LOPRESSOR  Take 0.5 tablets (12.5 mg total) by mouth daily.     multivitamin with minerals Tabs tablet  Take 1 tablet by mouth daily.     oxybutynin 10 MG 24 hr tablet  Commonly known as:  DITROPAN-XL  Take 10 mg by mouth at bedtime.     pantoprazole 40 MG tablet  Commonly known as:  PROTONIX  Take 40 mg by mouth 2 (two) times daily.     PARoxetine 20 MG tablet  Commonly known as:  PAXIL  Take 20 mg by mouth daily.     pravastatin 20 MG tablet  Commonly known as:  PRAVACHOL  Take 20 mg by mouth at bedtime.     sucralfate 1 G tablet  Commonly known as:  CARAFATE  Take 1 g by mouth 2 (two) times daily.     Vitamin D3  400 UNITS tablet  Take 400-800 Units by mouth 2 (two) times daily. Pt takes two tablets in the morning and one tablet at bedtime.     ZEASORB powder  Generic drug:  talc  Apply 1 application topically as needed (for rash under breasts).          Diet and Activity recommendation: See Discharge Instructions above   Consults obtained -GI   Major procedures and Radiology Reports - PLEASE review detailed and final reports for all details, in brief -     Ct Abdomen Pelvis Wo Contrast  07/22/2015  CLINICAL DATA:  Diffuse abdominal pain for 2 days EXAM: CT ABDOMEN AND PELVIS WITHOUT CONTRAST TECHNIQUE: Multidetector CT imaging of  the abdomen and pelvis was performed following the standard protocol without IV contrast. COMPARISON:  02/20/2015 FINDINGS: Lung bases demonstrate small bilateral pleural effusions and scarring. No focal infiltrate is seen. Pacing device is noted in the right ventricle. The gallbladder has been surgically removed. The liver, spleen, adrenal glands and pancreas are within normal limits. The left kidney is well visualized and demonstrates no renal calculi or urinary tract obstructive changes. The left ureter is within normal limits. The right kidney show some mild perinephric stranding. The ureter appears within normal limits. Mild aortoiliac calcifications are noted without aneurysmal dilatation. Postsurgical changes are noted in the lower lumbar spine. The bladder is partially distended. The uterus has been surgically removed. Mild diverticular change of the colon is seen without diverticulitis. Changes of prior colonic resection are noted. IMPRESSION: Mild perinephric stranding surrounding the right kidney. A localized infection could not be totally excluded on the basis of this exam. No obstructive changes are noted. Small bilateral pleural effusions. No other acute abnormality is seen Electronically Signed   By: Inez Catalina M.D.   On: 07/22/2015 16:23   Dg Chest 1  View  07/21/2015  CLINICAL DATA:  Chest pain and fever, onset this morning. EXAM: CHEST 1 VIEW COMPARISON:  07/01/2015 FINDINGS: Single lead left-sided pacemaker, tip projecting over the right ventricle. Cardiomediastinal contours are unchanged. There is no consolidation, large pleural effusion or pneumothorax. Postsurgical change in the lower cervical spine, partially included. Limited assessment of the left lung base due to soft tissue attenuation from body habitus. IMPRESSION: No acute pulmonary process. Electronically Signed   By: Jeb Levering M.D.   On: 07/21/2015 21:10   Dg Chest Port 1 View  07/01/2015  CLINICAL DATA:  Post cardiac pacemaker placement EXAM: PORTABLE CHEST 1 VIEW COMPARISON:  06/29/2015 FINDINGS: Left single lead pacer is in place with the tip in the right atrium. Cardiomegaly. No pneumothorax. Bibasilar atelectasis. No effusions or edema. No acute bony abnormality. IMPRESSION: Bibasilar atelectasis.  No pneumothorax. Electronically Signed   By: Rolm Baptise M.D.   On: 07/01/2015 14:15   Dg Chest Port 1 View  06/29/2015  CLINICAL DATA:  Atrial fibrillation EXAM: PORTABLE CHEST 1 VIEW COMPARISON:  06/14/2015 chest radiograph FINDINGS: Stable cardiomediastinal silhouette with mild cardiomegaly. No pneumothorax. No pleural effusion. Clear lungs, with no focal lung consolidation and no pulmonary edema. Partially visualized is surgical plate with interlocking screws overlying the lower cervical spine. IMPRESSION: Stable mild cardiomegaly without pulmonary edema.  Clear lungs. Electronically Signed   By: Ilona Sorrel M.D.   On: 06/29/2015 13:56   Dg C-arm 1-60 Min-no Report  07/01/2015  CLINICAL DATA: surgery C-ARM 1-60 MINUTES Fluoroscopy was utilized by the requesting physician.  No radiographic interpretation.    Micro Results     Recent Results (from the past 240 hour(s))  Blood culture (routine x 2)     Status: None (Preliminary result)   Collection Time: 07/21/15   8:32 PM  Result Value Ref Range Status   Specimen Description BLOOD RIGHT HAND  Final   Special Requests   Final    BOTTLES DRAWN AEROBIC AND ANAEROBIC 5CC AERO, 3CC ANA   Culture NO GROWTH 4 DAYS  Final   Report Status PENDING  Incomplete  Blood culture (routine x 2)     Status: None (Preliminary result)   Collection Time: 07/21/15  8:55 PM  Result Value Ref Range Status   Specimen Description BLOOD LEFT HAND  Final   Special Requests BOTTLES DRAWN  AEROBIC AND ANAEROBIC 5CC  Final   Culture NO GROWTH 4 DAYS  Final   Report Status PENDING  Incomplete  Urine culture     Status: None   Collection Time: 07/21/15  9:39 PM  Result Value Ref Range Status   Specimen Description URINE, CLEAN CATCH  Final   Special Requests Normal  Final   Culture >=100,000 COLONIES/mL ESCHERICHIA COLI  Final   Report Status 07/23/2015 FINAL  Final   Organism ID, Bacteria ESCHERICHIA COLI  Final      Susceptibility   Escherichia coli - MIC*    AMPICILLIN <=2 SENSITIVE Sensitive     CEFAZOLIN <=4 SENSITIVE Sensitive     CEFTRIAXONE <=1 SENSITIVE Sensitive     CIPROFLOXACIN <=0.25 SENSITIVE Sensitive     GENTAMICIN <=1 SENSITIVE Sensitive     IMIPENEM <=0.25 SENSITIVE Sensitive     NITROFURANTOIN <=16 SENSITIVE Sensitive     TRIMETH/SULFA <=20 SENSITIVE Sensitive     PIP/TAZO Value in next row Sensitive      SENSITIVE<=4    * >=100,000 COLONIES/mL ESCHERICHIA COLI       Today   Subjective:   Deborah Obrien today has no headache,no chest abdominal pain,no new weakness tingling or numbness, feels much better wants to go home todaay.  Objective:   Blood pressure 124/57, pulse 62, temperature 97.9 F (36.6 C), temperature source Oral, resp. rate 19, height 5\' 4"  (1.626 m), weight 126.735 kg (279 lb 6.4 oz), SpO2 96 %.   Intake/Output Summary (Last 24 hours) at 07/25/15 2128 Last data filed at 07/25/15 0900  Gross per 24 hour  Intake    840 ml  Output      2 ml  Net    838 ml     Exam Awake Alert, Oriented x 3, No new F.N deficits, Normal affect [place].AT,PERRAL Supple Neck,No JVD, No cervical lymphadenopathy appriciated.  Symmetrical Chest wall movement, Good air movement bilaterally, CTAB RRR,No Gallops,Rubs or new Murmurs, No Parasternal Heave +ve B.Sounds, Abd Soft, Non tender, No organomegaly appriciated, No rebound -guarding or rigidity. No Cyanosis, Clubbing or edema, No new Rash or bruise  Data Review   CBC w Diff: Lab Results  Component Value Date   WBC 7.4 07/24/2015   WBC 6.5 10/16/2014   HGB 10.2* 07/24/2015   HGB 10.3* 10/16/2014   HCT 31.7* 07/24/2015   HCT 33.4* 10/16/2014   PLT 155 07/24/2015   PLT 252 10/16/2014   LYMPHOPCT 8 07/21/2015   LYMPHOPCT 28.6 10/16/2014   MONOPCT 3 07/21/2015   MONOPCT 9.9 10/16/2014   EOSPCT 1 07/21/2015   EOSPCT 2.9 10/16/2014   BASOPCT 1 07/21/2015   BASOPCT 2.2 10/16/2014    CMP: Lab Results  Component Value Date   NA 139 07/24/2015   NA 141 10/16/2014   K 4.7 07/24/2015   K 4.6 10/16/2014   CL 109 07/24/2015   CL 108* 10/16/2014   CO2 17* 07/24/2015   CO2 27 10/16/2014   BUN 24* 07/24/2015   BUN 25* 10/16/2014   CREATININE 1.78* 07/24/2015   CREATININE 1.35* 10/16/2014   PROT 5.6* 07/23/2015   PROT 8.0 10/29/2012   ALBUMIN 2.7* 07/23/2015   ALBUMIN 3.9 10/29/2012   BILITOT 3.3* 07/23/2015   BILITOT 1.6* 10/29/2012   ALKPHOS 131* 07/23/2015   ALKPHOS 156* 10/29/2012   AST 36 07/23/2015   AST 47* 10/29/2012   ALT 23 07/23/2015   ALT 27 10/29/2012  .  Resume Home health thru [facility]. Total Time in  preparing paper work, data evaluation and todays exam - 64 minutes  Zariyah Stephens M.D on 07/25/2015 at 9:28 PM    Note: This dictation was prepared with Dragon dictation along with smaller phrase technology. Any transcriptional errors that result from this process are unintentional.

## 2015-07-25 NOTE — Care Management Important Message (Signed)
Important Message  Patient Details  Name: Deborah Obrien MRN: [id number] Date of Birth: 1940-10-13   Medicare Important Message Given:  Yes    Kirstin Kugler A, RN 07/25/2015, 1:05 PM

## 2015-07-25 NOTE — Anesthesia Postprocedure Evaluation (Signed)
  Anesthesia Post-op Note  Patient: Deborah Obrien  Procedure(s) Performed: Procedure(s): ESOPHAGOGASTRODUODENOSCOPY (EGD) WITH PROPOFOL (N/A)  Anesthesia type:General  Patient location: PACU  Post pain: Pain level controlled  Post assessment: Post-op Vital signs reviewed, Patient's Cardiovascular Status Stable, Respiratory Function Stable, Patent Airway and No signs of Nausea or vomiting  Post vital signs: Reviewed and stable  Last Vitals:  Filed Vitals:   07/25/15 0430  BP: 100/46  Pulse: 63  Temp: 36.7 C  Resp: 18    Level of consciousness: awake, alert  and patient cooperative  Complications: No apparent anesthesia complications

## 2015-07-25 NOTE — Care Management Note (Addendum)
Case Management Note  Patient Details  Name: Deborah Obrien MRN: [id number] Date of Birth: 03/12/1941  Subjective/Objective:   Discussed resumption of care order with [name] at [facility]. [name] will resume home health PT.  Deborah Obrien has a walker at home.                 Action/Plan:   Expected Discharge Date:  07/25/15               Expected Discharge Plan:     In-House Referral:     Discharge planning Services  CM Consult  Post Acute Care Choice:  Home Health Choice offered to:  Patient, Spouse  DME Arranged:    DME Agency:     HH Arranged:    HH Agency:  [facility]  Status of Service:  In process, will continue to follow  Medicare Important Message Given:    Date Medicare IM Given:    Medicare IM give by:    Date Additional Medicare IM Given:    Additional Medicare Important Message give by:     If discussed at [place] of Stay Meetings, dates discussed:    Additional Comments:  [name], RN 07/25/2015, 10:45 AM

## 2015-07-26 LAB — CULTURE, BLOOD (ROUTINE X 2)
CULTURE: NO GROWTH
Culture: NO GROWTH

## 2015-07-29 ENCOUNTER — Encounter: Payer: Self-pay | Admitting: Gastroenterology

## 2015-07-29 DIAGNOSIS — K317 Polyp of stomach and duodenum: Secondary | ICD-10-CM | POA: Insufficient documentation

## 2015-07-29 LAB — SURGICAL PATHOLOGY

## 2015-07-30 ENCOUNTER — Encounter: Payer: Self-pay | Admitting: Gastroenterology

## 2015-08-11 DIAGNOSIS — F3340 Major depressive disorder, recurrent, in remission, unspecified: Secondary | ICD-10-CM | POA: Insufficient documentation

## 2015-08-11 DIAGNOSIS — F331 Major depressive disorder, recurrent, moderate: Secondary | ICD-10-CM | POA: Insufficient documentation

## 2015-09-13 DIAGNOSIS — I509 Heart failure, unspecified: Secondary | ICD-10-CM

## 2015-09-13 HISTORY — DX: Heart failure, unspecified: I50.9

## 2015-11-30 DIAGNOSIS — E1140 Type 2 diabetes mellitus with diabetic neuropathy, unspecified: Secondary | ICD-10-CM | POA: Insufficient documentation

## 2015-11-30 DIAGNOSIS — E1149 Type 2 diabetes mellitus with other diabetic neurological complication: Secondary | ICD-10-CM

## 2015-12-01 ENCOUNTER — Other Ambulatory Visit: Payer: Self-pay | Admitting: Internal Medicine

## 2015-12-01 DIAGNOSIS — N63 Unspecified lump in unspecified breast: Secondary | ICD-10-CM

## 2015-12-16 ENCOUNTER — Ambulatory Visit
Admission: RE | Admit: 2015-12-16 | Discharge: 2015-12-16 | Disposition: A | Discharge status: Home / Self Care | Payer: Medicare Other | Source: Ambulatory Visit | Attending: Internal Medicine | Admitting: Internal Medicine

## 2015-12-16 DIAGNOSIS — N63 Unspecified lump in unspecified breast: Secondary | ICD-10-CM

## 2015-12-16 DIAGNOSIS — R928 Other abnormal and inconclusive findings on diagnostic imaging of breast: Secondary | ICD-10-CM | POA: Diagnosis not present

## 2016-01-03 ENCOUNTER — Encounter: Payer: Self-pay | Admitting: Emergency Medicine

## 2016-01-03 ENCOUNTER — Emergency Department
Admission: EM | Admit: 2016-01-03 | Discharge: 2016-01-03 | Disposition: A | Discharge status: Home / Self Care | Payer: Medicare Other | Attending: Emergency Medicine | Admitting: Emergency Medicine

## 2016-01-03 DIAGNOSIS — Y929 Unspecified place or not applicable: Secondary | ICD-10-CM | POA: Insufficient documentation

## 2016-01-03 DIAGNOSIS — Z950 Presence of cardiac pacemaker: Secondary | ICD-10-CM | POA: Insufficient documentation

## 2016-01-03 DIAGNOSIS — Z79899 Other long term (current) drug therapy: Secondary | ICD-10-CM | POA: Insufficient documentation

## 2016-01-03 DIAGNOSIS — S71132A Puncture wound without foreign body, left thigh, initial encounter: Secondary | ICD-10-CM | POA: Insufficient documentation

## 2016-01-03 DIAGNOSIS — Z8673 Personal history of transient ischemic attack (TIA), and cerebral infarction without residual deficits: Secondary | ICD-10-CM | POA: Diagnosis not present

## 2016-01-03 DIAGNOSIS — I129 Hypertensive chronic kidney disease with stage 1 through stage 4 chronic kidney disease, or unspecified chronic kidney disease: Secondary | ICD-10-CM | POA: Insufficient documentation

## 2016-01-03 DIAGNOSIS — W268XXA Contact with other sharp object(s), not elsewhere classified, initial encounter: Secondary | ICD-10-CM | POA: Insufficient documentation

## 2016-01-03 DIAGNOSIS — Z792 Long term (current) use of antibiotics: Secondary | ICD-10-CM | POA: Diagnosis not present

## 2016-01-03 DIAGNOSIS — T148XXA Other injury of unspecified body region, initial encounter: Secondary | ICD-10-CM

## 2016-01-03 DIAGNOSIS — Z7982 Long term (current) use of aspirin: Secondary | ICD-10-CM | POA: Diagnosis not present

## 2016-01-03 DIAGNOSIS — Y9389 Activity, other specified: Secondary | ICD-10-CM | POA: Diagnosis not present

## 2016-01-03 DIAGNOSIS — Z794 Long term (current) use of insulin: Secondary | ICD-10-CM | POA: Diagnosis not present

## 2016-01-03 DIAGNOSIS — N183 Chronic kidney disease, stage 3 (moderate): Secondary | ICD-10-CM | POA: Insufficient documentation

## 2016-01-03 DIAGNOSIS — E119 Type 2 diabetes mellitus without complications: Secondary | ICD-10-CM | POA: Insufficient documentation

## 2016-01-03 DIAGNOSIS — Y999 Unspecified external cause status: Secondary | ICD-10-CM | POA: Insufficient documentation

## 2016-01-03 DIAGNOSIS — I480 Paroxysmal atrial fibrillation: Secondary | ICD-10-CM | POA: Diagnosis not present

## 2016-01-03 HISTORY — DX: Gastro-esophageal reflux disease without esophagitis: K21.9

## 2016-01-03 HISTORY — DX: Fatty (change of) liver, not elsewhere classified: K76.0

## 2016-01-03 HISTORY — DX: Gastrointestinal hemorrhage, unspecified: K92.2

## 2016-01-03 HISTORY — DX: Unspecified osteoarthritis, unspecified site: M19.90

## 2016-01-03 HISTORY — DX: Transient cerebral ischemic attack, unspecified: G45.9

## 2016-01-03 HISTORY — DX: Unspecified intestinal obstruction, unspecified as to partial versus complete obstruction: K56.609

## 2016-01-03 HISTORY — DX: Anxiety disorder, unspecified: F41.9

## 2016-01-03 MED ORDER — SILVER NITRATE-POT NITRATE 75-25 % EX MISC
CUTANEOUS | Status: AC
  Filled 2016-01-03: qty 1

## 2016-01-03 NOTE — ED Provider Notes (Signed)
[facility] Emergency Department Provider Note  ____________________________________________  Time seen: Approximately 4:09 AM  I have reviewed the triage vital signs and the nursing notes.   HISTORY  Chief Complaint Laceration    HPI Deborah Obrien is a 75 y.o. female with extensive chronic medical issuesand who is currently in no acute distress.  She presents with a persistent bleeding wound on the back side of her left thigh.  She states that she was reaching behind her leg to feel for a wound from a tick that was right behind her knee when her fingernail accidentally cut a protruding varicose vein.  She had immediate onset of high-pressure venous bleeding.  This occurred about 2 days ago.  The bleeding has been intermittent since that time.  EMS was called out and applied a bandage but then she continued to bleed last night so she came to the emergency department.  She was bleeding in triage when the wound was exposed and a pressure bandage was applied.  She denies any pain and denies fever/chills, chest pain or shortness of breath, nausea, vomiting, diarrhea.  She does not take any blood thinners other than an 81 mg aspirin.  She reports that the bleeding was severe but is currently controlled with a pressure bandage.   Past Medical History  Diagnosis Date  . Diabetes mellitus without complication ([place])   . Hypertension   . Morbid obesity ([place])   . Chronic kidney disease (CKD) stage G3a/A1, moderately decreased glomerular filtration rate (GFR) between 45-59 mL/min/1.73 square meter and albuminuria creatinine ratio less than 30 mg/g   . Paroxysmal atrial fibrillation ([facility])   . Breast mass x 1 month    3-4 o'clock left  . Anxiety   . Acid reflux   . Fatty liver   . Osteoarthritis   . TIA (transient ischemic attack)   . GI bleed   . Bowel obstruction [facility])     Patient Active Problem List   Diagnosis Date Noted  . Gastritis   . Gastric polyp   .  Esophageal reflux   . Abdominal pain, epigastric   . Sepsis ([place]) 07/22/2015  . A-fib ([place]) 06/29/2015  . Intestinal obstruction ([place]) 02/20/2015    Past Surgical History  Procedure Laterality Date  . Shoulder arthroscopy with subacromial decompression Left   . Esophagogastroduodenoscopy N/A 01/27/2015    Procedure: ESOPHAGOGASTRODUODENOSCOPY (EGD);  Surgeon: Lollie Sails, MD;  Location: [facility] ENDOSCOPY;  Service: Endoscopy;  Laterality: N/A;  . Colon surgery    . 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 Esophagogastroduodenoscopy (egd) with propofol N/A 07/24/2015    Procedure: ESOPHAGOGASTRODUODENOSCOPY (EGD) WITH PROPOFOL;  Surgeon: Lucilla Lame, MD;  Location: [facility] ENDOSCOPY;  Service: Endoscopy;  Laterality: N/A;  . Breast biopsy Right 08/16    stereo fibroadenomatous change, neg for atypia  . Back surgery    . Replacement total knee bilateral      Current Outpatient Rx  Name  Route  Sig  Dispense  Refill  . ALPRAZolam (XANAX) 0.5 MG tablet   Oral   Take 1 tablet (0.5 mg total) by mouth at bedtime as needed for anxiety or sleep.   10 tablet   0   . amiodarone (PACERONE) 400 MG tablet   Oral   Take 1 tablet (400 mg total) by mouth daily.   30 tablet   0   .  amoxicillin (AMOXIL) 500 MG capsule   Oral   Take 1 capsule (500 mg total) by mouth 2 (two) times daily.   20 capsule   0   . APPLE CIDER VINEGAR PO   Oral   Take 1 tablet by mouth 2 (two) times daily.         Marland Kitchen aspirin EC 81 MG tablet   Oral   Take 81 mg by mouth at bedtime.         . Calcium Carbonate-Vitamin D (CALCIUM 600+D) 600-400 MG-UNIT tablet   Oral   Take 1 tablet by mouth 2 (two) times daily.         . Cholecalciferol (VITAMIN D3) 400 UNITS tablet   Oral   Take 400-800 Units by mouth 2 (two) times daily. Pt takes two tablets in the morning and one  tablet at bedtime.         Marland Kitchen CINNAMON PO   Oral   Take 2 tablets by mouth 2 (two) times daily.         . Cranberry-Vitamin C (AZO CRANBERRY URINARY TRACT) 250-60 MG CAPS   Oral   Take 2 capsules by mouth 2 (two) times daily.         Marland Kitchen docusate sodium (COLACE) 100 MG capsule   Oral   Take 200 mg by mouth at bedtime.         . furosemide (LASIX) 40 MG tablet   Oral   Take 40 mg by mouth daily as needed for edema.         . hydrocortisone 2.5 % cream   Topical   Apply 1 application topically at bedtime as needed (for rash under breasts).          . insulin aspart (NOVOLOG) 100 UNIT/ML injection   Subcutaneous   Inject 15-35 Units into the skin 3 (three) times daily before meals. Pt uses as needed per sliding scale:    0-150:  15 units  151-200:  17 units  201-250:  19 units  251-300:  23 units  301-350:  27 units  351-400:  31 units  401 and greater:  35 units         . insulin detemir (LEVEMIR) 100 UNIT/ML injection   Subcutaneous   Inject 40 Units into the skin at bedtime.         Marland Kitchen ketoconazole (NIZORAL) 2 % cream   Topical   Apply 1 application topically every 2 (two) hours as needed (for rash under breasts).          . metoprolol tartrate (LOPRESSOR) 25 MG tablet   Oral   Take 0.5 tablets (12.5 mg total) by mouth daily.   20 tablet   0   . Multiple Vitamin (MULTIVITAMIN WITH MINERALS) TABS tablet   Oral   Take 1 tablet by mouth daily.         Marland Kitchen oxybutynin (DITROPAN-XL) 10 MG 24 hr tablet   Oral   Take 10 mg by mouth at bedtime.         . pantoprazole (PROTONIX) 40 MG tablet   Oral   Take 40 mg by mouth 2 (two) times daily.         Marland Kitchen PARoxetine (PAXIL) 20 MG tablet   Oral   Take 20 mg by mouth daily.         . pravastatin (PRAVACHOL) 20 MG tablet   Oral   Take 20 mg by mouth at bedtime.          Marland Kitchen  sucralfate (CARAFATE) 1 G tablet   Oral   Take 1 g by mouth 2 (two) times daily.          Marland Kitchen talc (ZEASORB) powder    Topical   Apply 1 application topically as needed (for rash under breasts).           Allergies Cephalosporins; Hydrocodone-acetaminophen; Macrolides and ketolides; Meperidine; Shellfish allergy; Sulfa antibiotics; Aspirin; Celecoxib; Cephalexin; Erythromycin; Hydromorphone; Iodinated diagnostic agents; Oxycodone; Atorvastatin; Codeine; Prednisone; Tape; Valdecoxib; and Iodine  Family History  Problem Relation Age of Onset  . Diabetes Mellitus II Mother   . CAD Mother   . Breast cancer Neg Hx     Social History Social History  Substance Use Topics  . Smoking status: Never Smoker   . Smokeless tobacco: None  . Alcohol Use: No    Review of Systems Constitutional: No fever/chills Cardiovascular: Denies chest pain. Respiratory: Denies shortness of breath. Gastrointestinal: No abdominal pain.  No nausea, no vomiting.  No diarrhea.  No constipation. Genitourinary: Negative for dysuria. Musculoskeletal: Negative for back pain. Skin: Negative for rash.  Bleeding from wound in left posterior distal thigh Neurological: Negative for headaches, focal weakness or numbness.  10-point ROS otherwise negative.  ____________________________________________   PHYSICAL EXAM:  VITAL SIGNS: ED Triage Vitals  Enc Vitals Group     BP 01/03/16 0033 122/70 mmHg     Pulse Rate 01/03/16 0033 99     Resp 01/03/16 0033 18     Temp 01/03/16 0033 98.2 F (36.8 C)     Temp Source 01/03/16 0033 Oral     SpO2 01/03/16 0033 99 %     Weight 01/03/16 0033 258 lb (117.028 kg)     Height 01/03/16 0033 5\' 4"  (1.626 m)     Head Cir --      Peak Flow --      Pain Score 01/03/16 0033 4     Pain Loc --      Pain Edu? --      Excl. in [place]? --     Constitutional: Alert and oriented. Well appearing and in no acute distress. Eyes: Conjunctivae are normal. PERRL. EOMI. Head: Atraumatic. Neck: No stridor.  No meningeal signs.   Cardiovascular: Normal rate, regular rhythm. Good peripheral circulation.  Grossly normal heart sounds.   Respiratory: Normal respiratory effort.  No retractions. Lungs CTAB. Gastrointestinal: Soft and nontender. No distention.  Musculoskeletal: No lower extremity tenderness nor edema. No gross deformities of extremities. Neurologic:  Normal speech and language. No gross focal neurologic deficits are appreciated.  Skin:  Skin is warm, dry and intact. Varicose vein on left lower posterior thigh with a small puncture-like wound, currently not bleeding   ____________________________________________   LABS (all labs ordered are listed, but only abnormal results are displayed)  Labs Reviewed - No data to display ____________________________________________  EKG  None ____________________________________________  RADIOLOGY   No results found.  ____________________________________________   PROCEDURES  Procedure(s) performed: cautery, see procedure note(s).  To help promote hemostasis, I applied a status of over silver nitrate to the wound.  No acute complications.  Critical Care performed: No ____________________________________________   INITIAL IMPRESSION / ASSESSMENT AND PLAN / ED COURSE  Pertinent labs & imaging results that were available during my care of the patient were reviewed by me and considered in my medical decision making (see chart for details).  Bleeding stopped, cauterized wound for extra hemostatic control.  Re-applied clean bandage, gave usual/customary return precautions.  ____________________________________________  FINAL CLINICAL IMPRESSION(S) / ED DIAGNOSES  Final diagnoses:  Puncture wound      NEW MEDICATIONS STARTED DURING THIS VISIT:  New Prescriptions   No medications on file      Note:  This document was prepared using Dragon voice recognition software and may include unintentional dictation errors.   Hinda Kehr, MD 01/03/16 [phone]

## 2016-01-03 NOTE — ED Notes (Signed)
Patient states that she punctured a varicose vein yesterday around noon. Patient states that she was seen by ems yesterday and a pressure dressing was applied. Patient reports that it continues to bleed. Pressure dressing applied.

## 2016-01-03 NOTE — ED Notes (Signed)
Pt reports on Friday she hit lateral left leg, above the knee. Pt states: "It looks like the vein is cut in half". EMS came Friday and bandaged wound, asked if pt wanted to come to ED - Pt declined. Pt reports wound continued to bleed since injury. Pt reports when bandage was taken off in triage the wound bled heavily.

## 2016-01-03 NOTE — Discharge Instructions (Signed)
Keep the wound covered with a pressure bandage for the next couple of days until you see your regular doctor to follow up, although you should feel free to shower and change the dressing twice daily.  Return if you develop new or worsening symptoms that concern you.

## 2016-01-03 NOTE — ED Notes (Signed)
Pt has hx of pacemaker, and type II diabetes. Pt takes sliding scale novalog, as well as levemir for diabetes. Pt has taken medication tonight.

## 2016-01-03 NOTE — ED Notes (Signed)
Reviewed d/c instructions, and follow-up care with pt. Pt verbalized understanding

## 2016-01-24 DIAGNOSIS — Z8249 Family history of ischemic heart disease and other diseases of the circulatory system: Secondary | ICD-10-CM

## 2016-01-24 DIAGNOSIS — I959 Hypotension, unspecified: Secondary | ICD-10-CM | POA: Diagnosis present

## 2016-01-24 DIAGNOSIS — Z96653 Presence of artificial knee joint, bilateral: Secondary | ICD-10-CM | POA: Diagnosis present

## 2016-01-24 DIAGNOSIS — J189 Pneumonia, unspecified organism: Secondary | ICD-10-CM | POA: Diagnosis present

## 2016-01-24 DIAGNOSIS — N184 Chronic kidney disease, stage 4 (severe): Secondary | ICD-10-CM | POA: Diagnosis present

## 2016-01-24 DIAGNOSIS — Z794 Long term (current) use of insulin: Secondary | ICD-10-CM

## 2016-01-24 DIAGNOSIS — R197 Diarrhea, unspecified: Secondary | ICD-10-CM | POA: Diagnosis present

## 2016-01-24 DIAGNOSIS — E1122 Type 2 diabetes mellitus with diabetic chronic kidney disease: Secondary | ICD-10-CM | POA: Diagnosis present

## 2016-01-24 DIAGNOSIS — Z833 Family history of diabetes mellitus: Secondary | ICD-10-CM

## 2016-01-24 DIAGNOSIS — A419 Sepsis, unspecified organism: Principal | ICD-10-CM | POA: Diagnosis present

## 2016-01-24 DIAGNOSIS — E785 Hyperlipidemia, unspecified: Secondary | ICD-10-CM | POA: Diagnosis present

## 2016-01-24 DIAGNOSIS — Z6841 Body Mass Index (BMI) 40.0 and over, adult: Secondary | ICD-10-CM

## 2016-01-24 DIAGNOSIS — M1990 Unspecified osteoarthritis, unspecified site: Secondary | ICD-10-CM | POA: Diagnosis present

## 2016-01-24 DIAGNOSIS — I130 Hypertensive heart and chronic kidney disease with heart failure and stage 1 through stage 4 chronic kidney disease, or unspecified chronic kidney disease: Secondary | ICD-10-CM | POA: Diagnosis present

## 2016-01-24 DIAGNOSIS — R112 Nausea with vomiting, unspecified: Secondary | ICD-10-CM | POA: Diagnosis present

## 2016-01-24 DIAGNOSIS — I480 Paroxysmal atrial fibrillation: Secondary | ICD-10-CM | POA: Diagnosis present

## 2016-01-24 DIAGNOSIS — I5033 Acute on chronic diastolic (congestive) heart failure: Secondary | ICD-10-CM | POA: Diagnosis present

## 2016-01-24 DIAGNOSIS — Z7982 Long term (current) use of aspirin: Secondary | ICD-10-CM

## 2016-01-24 DIAGNOSIS — Z79899 Other long term (current) drug therapy: Secondary | ICD-10-CM

## 2016-01-24 DIAGNOSIS — R109 Unspecified abdominal pain: Secondary | ICD-10-CM | POA: Diagnosis present

## 2016-01-24 DIAGNOSIS — K219 Gastro-esophageal reflux disease without esophagitis: Secondary | ICD-10-CM | POA: Diagnosis present

## 2016-01-24 DIAGNOSIS — Z950 Presence of cardiac pacemaker: Secondary | ICD-10-CM

## 2016-01-24 DIAGNOSIS — Z8673 Personal history of transient ischemic attack (TIA), and cerebral infarction without residual deficits: Secondary | ICD-10-CM

## 2016-01-24 NOTE — ED Notes (Signed)
Pt reports cough for 4 days, fever x 1 day, abd pain 3 days, n/v/d and states she almost passed out this evening because her blood sugar was low.

## 2016-01-25 ENCOUNTER — Inpatient Hospital Stay
Admission: EM | Admit: 2016-01-25 | Discharge: 2016-01-28 | DRG: 871 | Disposition: A | Discharge status: Home Health | Payer: Medicare Other | Attending: Internal Medicine | Admitting: Internal Medicine

## 2016-01-25 ENCOUNTER — Emergency Department: Payer: Medicare Other

## 2016-01-25 ENCOUNTER — Encounter: Payer: Self-pay | Admitting: Internal Medicine

## 2016-01-25 DIAGNOSIS — M1990 Unspecified osteoarthritis, unspecified site: Secondary | ICD-10-CM | POA: Diagnosis present

## 2016-01-25 DIAGNOSIS — Z7982 Long term (current) use of aspirin: Secondary | ICD-10-CM | POA: Diagnosis not present

## 2016-01-25 DIAGNOSIS — Z833 Family history of diabetes mellitus: Secondary | ICD-10-CM | POA: Diagnosis not present

## 2016-01-25 DIAGNOSIS — R109 Unspecified abdominal pain: Secondary | ICD-10-CM | POA: Diagnosis present

## 2016-01-25 DIAGNOSIS — E785 Hyperlipidemia, unspecified: Secondary | ICD-10-CM | POA: Diagnosis present

## 2016-01-25 DIAGNOSIS — I5033 Acute on chronic diastolic (congestive) heart failure: Secondary | ICD-10-CM | POA: Diagnosis present

## 2016-01-25 DIAGNOSIS — Z8673 Personal history of transient ischemic attack (TIA), and cerebral infarction without residual deficits: Secondary | ICD-10-CM | POA: Diagnosis not present

## 2016-01-25 DIAGNOSIS — Z96653 Presence of artificial knee joint, bilateral: Secondary | ICD-10-CM | POA: Diagnosis present

## 2016-01-25 DIAGNOSIS — R112 Nausea with vomiting, unspecified: Secondary | ICD-10-CM | POA: Diagnosis present

## 2016-01-25 DIAGNOSIS — I130 Hypertensive heart and chronic kidney disease with heart failure and stage 1 through stage 4 chronic kidney disease, or unspecified chronic kidney disease: Secondary | ICD-10-CM | POA: Diagnosis present

## 2016-01-25 DIAGNOSIS — Z79899 Other long term (current) drug therapy: Secondary | ICD-10-CM | POA: Diagnosis not present

## 2016-01-25 DIAGNOSIS — E1122 Type 2 diabetes mellitus with diabetic chronic kidney disease: Secondary | ICD-10-CM | POA: Diagnosis present

## 2016-01-25 DIAGNOSIS — N184 Chronic kidney disease, stage 4 (severe): Secondary | ICD-10-CM | POA: Diagnosis present

## 2016-01-25 DIAGNOSIS — R509 Fever, unspecified: Secondary | ICD-10-CM

## 2016-01-25 DIAGNOSIS — J189 Pneumonia, unspecified organism: Secondary | ICD-10-CM | POA: Diagnosis present

## 2016-01-25 DIAGNOSIS — A419 Sepsis, unspecified organism: Secondary | ICD-10-CM | POA: Diagnosis present

## 2016-01-25 DIAGNOSIS — Z794 Long term (current) use of insulin: Secondary | ICD-10-CM | POA: Diagnosis not present

## 2016-01-25 DIAGNOSIS — K219 Gastro-esophageal reflux disease without esophagitis: Secondary | ICD-10-CM | POA: Diagnosis present

## 2016-01-25 DIAGNOSIS — N179 Acute kidney failure, unspecified: Secondary | ICD-10-CM

## 2016-01-25 DIAGNOSIS — Z950 Presence of cardiac pacemaker: Secondary | ICD-10-CM | POA: Diagnosis not present

## 2016-01-25 DIAGNOSIS — I480 Paroxysmal atrial fibrillation: Secondary | ICD-10-CM | POA: Diagnosis present

## 2016-01-25 DIAGNOSIS — I959 Hypotension, unspecified: Secondary | ICD-10-CM | POA: Diagnosis present

## 2016-01-25 DIAGNOSIS — Z8249 Family history of ischemic heart disease and other diseases of the circulatory system: Secondary | ICD-10-CM | POA: Diagnosis not present

## 2016-01-25 DIAGNOSIS — R197 Diarrhea, unspecified: Secondary | ICD-10-CM | POA: Diagnosis present

## 2016-01-25 DIAGNOSIS — N189 Chronic kidney disease, unspecified: Secondary | ICD-10-CM

## 2016-01-25 DIAGNOSIS — Z6841 Body Mass Index (BMI) 40.0 and over, adult: Secondary | ICD-10-CM | POA: Diagnosis not present

## 2016-01-25 LAB — COMPREHENSIVE METABOLIC PANEL
ALBUMIN: 2.8 g/dL — AB (ref 3.5–5.0)
ALK PHOS: 118 U/L (ref 38–126)
ALK PHOS: 131 U/L — AB (ref 38–126)
ALT: 28 U/L (ref 14–54)
ALT: 24 U/L (ref 14–54)
ANION GAP: 7 (ref 5–15)
ANION GAP: 9 (ref 5–15)
AST: 59 U/L — ABNORMAL HIGH (ref 15–41)
AST: 51 U/L — AB (ref 15–41)
Albumin: 3.4 g/dL — ABNORMAL LOW (ref 3.5–5.0)
BILIRUBIN TOTAL: 1.5 mg/dL — AB (ref 0.3–1.2)
BUN: 30 mg/dL — AB (ref 6–20)
BUN: 33 mg/dL — AB (ref 6–20)
CALCIUM: 7.8 mg/dL — AB (ref 8.9–10.3)
CALCIUM: 8.3 mg/dL — AB (ref 8.9–10.3)
CO2: 23 mmol/L (ref 22–32)
CO2: 25 mmol/L (ref 22–32)
Chloride: 101 mmol/L (ref 101–111)
Chloride: 105 mmol/L (ref 101–111)
Creatinine, Ser: 1.58 mg/dL — ABNORMAL HIGH (ref 0.44–1.00)
Creatinine, Ser: 1.49 mg/dL — ABNORMAL HIGH (ref 0.44–1.00)
GFR calc Af Amer: 36 mL/min — ABNORMAL LOW (ref >60)
GFR calc Af Amer: 38 mL/min — ABNORMAL LOW (ref >60)
GFR calc non Af Amer: 31 mL/min — ABNORMAL LOW (ref >60)
GFR calc non Af Amer: 33 mL/min — ABNORMAL LOW (ref >60)
GLUCOSE: 134 mg/dL — AB (ref 65–99)
GLUCOSE: 204 mg/dL — AB (ref 65–99)
POTASSIUM: 3.9 mmol/L (ref 3.5–5.1)
POTASSIUM: 4 mmol/L (ref 3.5–5.1)
SODIUM: 135 mmol/L (ref 135–145)
Sodium: 135 mmol/L (ref 135–145)
Total Bilirubin: 1.4 mg/dL — ABNORMAL HIGH (ref 0.3–1.2)
Total Protein: 7 g/dL (ref 6.5–8.1)
Total Protein: 6.2 g/dL — ABNORMAL LOW (ref 6.5–8.1)

## 2016-01-25 LAB — BLOOD GAS, VENOUS
ACID-BASE EXCESS: 3 mmol/L (ref 0.0–3.0)
Bicarbonate: 27 mEq/L (ref 21.0–28.0)
FIO2: 0.21
O2 SAT: 79.5 %
PATIENT TEMPERATURE: 37
PCO2 VEN: 38 mmHg — AB (ref 44.0–60.0)
pH, Ven: 7.46 — ABNORMAL HIGH (ref 7.320–7.430)
pO2, Ven: 41 mmHg (ref 31.0–45.0)

## 2016-01-25 LAB — CBC WITH DIFFERENTIAL/PLATELET
BASOS ABS: 0 10*3/uL (ref 0–0.1)
Basophils Absolute: 0 10*3/uL (ref 0–0.1)
Basophils Relative: 1 %
EOS PCT: 0 %
Eosinophils Absolute: 0 10*3/uL (ref 0–0.7)
Eosinophils Absolute: 0 10*3/uL (ref 0–0.7)
Eosinophils Relative: 1 %
HCT: 31.7 % — ABNORMAL LOW (ref 35.0–47.0)
HEMATOCRIT: 32.1 % — AB (ref 35.0–47.0)
HEMOGLOBIN: 10 g/dL — AB (ref 12.0–16.0)
Hemoglobin: 10.3 g/dL — ABNORMAL LOW (ref 12.0–16.0)
LYMPHS PCT: 15 %
Lymphocytes Relative: 19 %
Lymphs Abs: 0.8 10*3/uL — ABNORMAL LOW (ref 1.0–3.6)
Lymphs Abs: 1 10*3/uL (ref 1.0–3.6)
MCH: 26 pg (ref 26.0–34.0)
MCH: 26.2 pg (ref 26.0–34.0)
MCHC: 31.7 g/dL — AB (ref 32.0–36.0)
MCHC: 32.1 g/dL (ref 32.0–36.0)
MCV: 81.5 fL (ref 80.0–100.0)
MCV: 82.1 fL (ref 80.0–100.0)
MONO ABS: 0.5 10*3/uL (ref 0.2–0.9)
Monocytes Absolute: 0.5 10*3/uL (ref 0.2–0.9)
Monocytes Relative: 10 %
NEUTROS ABS: 3.6 10*3/uL (ref 1.4–6.5)
NEUTROS ABS: 3.9 10*3/uL (ref 1.4–6.5)
Neutrophils Relative %: 74 %
PLATELETS: 170 10*3/uL (ref 150–440)
Platelets: 126 10*3/uL — ABNORMAL LOW (ref 150–440)
RBC: 3.86 MIL/uL (ref 3.80–5.20)
RBC: 3.94 MIL/uL (ref 3.80–5.20)
RDW: 16.9 % — AB (ref 11.5–14.5)
RDW: 16.9 % — ABNORMAL HIGH (ref 11.5–14.5)
WBC: 5.1 10*3/uL (ref 3.6–11.0)
WBC: 5.2 10*3/uL (ref 3.6–11.0)

## 2016-01-25 LAB — URINALYSIS COMPLETE WITH MICROSCOPIC (ARMC ONLY)
Bacteria, UA: NONE SEEN
Bilirubin Urine: NEGATIVE
GLUCOSE, UA: NEGATIVE mg/dL
Hgb urine dipstick: NEGATIVE
KETONES UR: NEGATIVE mg/dL
Leukocytes, UA: NEGATIVE
Nitrite: NEGATIVE
PROTEIN: NEGATIVE mg/dL
Specific Gravity, Urine: 1.017 (ref 1.005–1.030)
pH: 5 (ref 5.0–8.0)

## 2016-01-25 LAB — APTT: aPTT: 27 seconds (ref 24–36)

## 2016-01-25 LAB — MRSA PCR SCREENING: MRSA by PCR: NEGATIVE

## 2016-01-25 LAB — TROPONIN I

## 2016-01-25 LAB — GLUCOSE, CAPILLARY
GLUCOSE-CAPILLARY: 213 mg/dL — AB (ref 65–99)
Glucose-Capillary: 120 mg/dL — ABNORMAL HIGH (ref 65–99)
Glucose-Capillary: 245 mg/dL — ABNORMAL HIGH (ref 65–99)
Glucose-Capillary: 151 mg/dL — ABNORMAL HIGH (ref 65–99)

## 2016-01-25 LAB — PROTIME-INR
INR: 1.28
PROTHROMBIN TIME: 16.1 s — AB (ref 11.4–15.0)

## 2016-01-25 LAB — LACTIC ACID, PLASMA
LACTIC ACID, VENOUS: 1.3 mmol/L (ref 0.5–2.0)
Lactic Acid, Venous: 1.8 mmol/L (ref 0.5–2.0)
Lactic Acid, Venous: 1.7 mmol/L (ref 0.5–2.0)

## 2016-01-25 LAB — PROCALCITONIN: Procalcitonin: <0.1 ng/mL

## 2016-01-25 MED ORDER — HYDROCORTISONE 2.5 % EX CREA
1.0000 | TOPICAL_CREAM | Freq: Two times a day (BID) | CUTANEOUS | Status: DC
Start: 2016-01-25 — End: 2016-01-28
  Administered 2016-01-25 – 2016-01-28 (×4): 1 via TOPICAL
  Filled 2016-01-25: qty 30

## 2016-01-25 MED ORDER — VITAMIN D3 10 MCG (400 UNIT) PO TABS: 400.0000 [IU] | ORAL_TABLET | Freq: Two times a day (BID) | ORAL | Status: DC

## 2016-01-25 MED ORDER — FENTANYL CITRATE (PF) 100 MCG/2ML IJ SOLN
50.0000 ug | Freq: Once | INTRAMUSCULAR | Status: AC
  Administered 2016-01-25: 50 ug via INTRAVENOUS

## 2016-01-25 MED ORDER — ACETAMINOPHEN 650 MG RE SUPP: 650.0000 mg | Freq: Four times a day (QID) | RECTAL | Status: DC | PRN

## 2016-01-25 MED ORDER — SODIUM CHLORIDE 0.9 % IV BOLUS (SEPSIS)
1000.0000 mL | Freq: Once | INTRAVENOUS | Status: AC
  Administered 2016-01-25: 1000 mL via INTRAVENOUS

## 2016-01-25 MED ORDER — ADULT MULTIVITAMIN W/MINERALS CH
1.0000 | ORAL_TABLET | Freq: Every day | ORAL | Status: DC
  Administered 2016-01-25 – 2016-01-28 (×4): 1 via ORAL
  Filled 2016-01-25 (×4): qty 1

## 2016-01-25 MED ORDER — CHOLECALCIFEROL 10 MCG (400 UNIT) PO TABS
800.0000 [IU] | ORAL_TABLET | Freq: Every day | ORAL | Status: DC
  Administered 2016-01-25 – 2016-01-28 (×4): 800 [IU] via ORAL
  Filled 2016-01-25 (×6): qty 2

## 2016-01-25 MED ORDER — SODIUM CHLORIDE 0.9 % IV SOLN
INTRAVENOUS | Status: DC
Start: 2016-01-25 — End: 2016-01-26
  Administered 2016-01-25: 1000 mL via INTRAVENOUS
  Administered 2016-01-25: 22:00:00 via INTRAVENOUS

## 2016-01-25 MED ORDER — ONDANSETRON HCL 4 MG/2ML IJ SOLN: 4.0000 mg | Freq: Four times a day (QID) | INTRAMUSCULAR | Status: DC | PRN

## 2016-01-25 MED ORDER — SENNOSIDES-DOCUSATE SODIUM 8.6-50 MG PO TABS: 1.0000 | ORAL_TABLET | Freq: Every evening | ORAL | Status: DC | PRN

## 2016-01-25 MED ORDER — VANCOMYCIN HCL IN DEXTROSE 1-5 GM/200ML-% IV SOLN: 1000.0000 mg | Freq: Once | INTRAVENOUS | Status: DC

## 2016-01-25 MED ORDER — PROCHLORPERAZINE EDISYLATE 5 MG/ML IJ SOLN
10.0000 mg | INTRAMUSCULAR | Status: DC | PRN
  Administered 2016-01-25: 10 mg via INTRAVENOUS
  Filled 2016-01-25: qty 2

## 2016-01-25 MED ORDER — VANCOMYCIN HCL IN DEXTROSE 1-5 GM/200ML-% IV SOLN
1000.0000 mg | Freq: Once | INTRAVENOUS | Status: AC
  Administered 2016-01-25: 1000 mg via INTRAVENOUS
  Filled 2016-01-25: qty 200

## 2016-01-25 MED ORDER — VANCOMYCIN HCL 10 G IV SOLR
1250.0000 mg | INTRAVENOUS | Status: DC
  Administered 2016-01-25: 1250 mg via INTRAVENOUS
  Filled 2016-01-25 (×2): qty 1250

## 2016-01-25 MED ORDER — ONDANSETRON HCL 4 MG PO TABS: 4.0000 mg | ORAL_TABLET | Freq: Four times a day (QID) | ORAL | Status: DC | PRN

## 2016-01-25 MED ORDER — ACETAMINOPHEN 325 MG PO TABS: 650.0000 mg | ORAL_TABLET | Freq: Four times a day (QID) | ORAL | Status: DC | PRN

## 2016-01-25 MED ORDER — ONDANSETRON HCL 4 MG/2ML IJ SOLN
4.0000 mg | Freq: Once | INTRAMUSCULAR | Status: AC
  Administered 2016-01-25: 4 mg via INTRAVENOUS

## 2016-01-25 MED ORDER — OXYBUTYNIN CHLORIDE ER 10 MG PO TB24
10.0000 mg | ORAL_TABLET | Freq: Every day | ORAL | Status: DC
  Administered 2016-01-25 – 2016-01-27 (×3): 10 mg via ORAL
  Filled 2016-01-25 (×4): qty 1

## 2016-01-25 MED ORDER — TRAZODONE HCL 100 MG PO TABS
100.0000 mg | ORAL_TABLET | Freq: Every day | ORAL | Status: DC
  Administered 2016-01-25 – 2016-01-27 (×3): 100 mg via ORAL
  Filled 2016-01-25 (×3): qty 1

## 2016-01-25 MED ORDER — SUCRALFATE 1 G PO TABS
1.0000 g | ORAL_TABLET | Freq: Two times a day (BID) | ORAL | Status: DC
  Administered 2016-01-25 – 2016-01-28 (×7): 1 g via ORAL
  Filled 2016-01-25 (×7): qty 1

## 2016-01-25 MED ORDER — PRAVASTATIN SODIUM 20 MG PO TABS
20.0000 mg | ORAL_TABLET | Freq: Every day | ORAL | Status: DC
  Administered 2016-01-25 – 2016-01-27 (×3): 20 mg via ORAL
  Filled 2016-01-25 (×3): qty 1

## 2016-01-25 MED ORDER — PANTOPRAZOLE SODIUM 40 MG PO TBEC
40.0000 mg | DELAYED_RELEASE_TABLET | Freq: Two times a day (BID) | ORAL | Status: DC
  Administered 2016-01-25 – 2016-01-28 (×7): 40 mg via ORAL
  Filled 2016-01-25 (×8): qty 1

## 2016-01-25 MED ORDER — CINNAMON 500 MG PO CAPS: ORAL_CAPSULE | Freq: Two times a day (BID) | ORAL | Status: DC

## 2016-01-25 MED ORDER — INSULIN ASPART 100 UNIT/ML ~~LOC~~ SOLN
0.0000 [IU] | Freq: Three times a day (TID) | SUBCUTANEOUS | Status: DC
  Administered 2016-01-25 (×2): 7 [IU] via SUBCUTANEOUS
  Administered 2016-01-26 – 2016-01-27 (×4): 3 [IU] via SUBCUTANEOUS
  Administered 2016-01-27: 4 [IU] via SUBCUTANEOUS
  Administered 2016-01-28: 3 [IU] via SUBCUTANEOUS
  Filled 2016-01-25 (×3): qty 3
  Filled 2016-01-25: qty 4
  Filled 2016-01-25: qty 3
  Filled 2016-01-25: qty 7
  Filled 2016-01-25: qty 20
  Filled 2016-01-25: qty 3

## 2016-01-25 MED ORDER — DOCUSATE SODIUM 100 MG PO CAPS
200.0000 mg | ORAL_CAPSULE | Freq: Every day | ORAL | Status: DC
  Administered 2016-01-25 – 2016-01-27 (×3): 200 mg via ORAL
  Filled 2016-01-25 (×3): qty 2

## 2016-01-25 MED ORDER — ENOXAPARIN SODIUM 40 MG/0.4ML ~~LOC~~ SOLN
40.0000 mg | Freq: Two times a day (BID) | SUBCUTANEOUS | Status: DC
  Administered 2016-01-25 – 2016-01-28 (×6): 40 mg via SUBCUTANEOUS
  Filled 2016-01-25 (×6): qty 0.4

## 2016-01-25 MED ORDER — SODIUM CHLORIDE 0.9 % IV BOLUS (SEPSIS): 1000.0000 mL | Freq: Once | INTRAVENOUS | Status: DC

## 2016-01-25 MED ORDER — ASPIRIN EC 81 MG PO TBEC
81.0000 mg | DELAYED_RELEASE_TABLET | Freq: Every day | ORAL | Status: DC
  Administered 2016-01-25 – 2016-01-27 (×3): 81 mg via ORAL
  Filled 2016-01-25 (×3): qty 1

## 2016-01-25 MED ORDER — MUPIROCIN CALCIUM 2 % EX CREA
1.0000 "application " | TOPICAL_CREAM | Freq: Every day | CUTANEOUS | Status: DC
  Administered 2016-01-25 – 2016-01-27 (×2): 1 via TOPICAL
  Filled 2016-01-25: qty 15

## 2016-01-25 MED ORDER — CALCIUM CARBONATE-VITAMIN D 500-200 MG-UNIT PO TABS
1.0000 | ORAL_TABLET | Freq: Two times a day (BID) | ORAL | Status: DC
  Administered 2016-01-25 – 2016-01-28 (×6): 1 via ORAL
  Filled 2016-01-25 (×8): qty 1

## 2016-01-25 MED ORDER — ONDANSETRON HCL 4 MG/2ML IJ SOLN
4.0000 mg | Freq: Once | INTRAMUSCULAR | Status: AC
  Administered 2016-01-25: 4 mg via INTRAVENOUS
  Filled 2016-01-25: qty 2

## 2016-01-25 MED ORDER — AZTREONAM 2 G IJ SOLR
2.0000 g | Freq: Once | INTRAMUSCULAR | Status: AC
  Administered 2016-01-25: 2 g via INTRAVENOUS
  Filled 2016-01-25: qty 2

## 2016-01-25 MED ORDER — INSULIN DETEMIR 100 UNIT/ML ~~LOC~~ SOLN
40.0000 [IU] | Freq: Every day | SUBCUTANEOUS | Status: DC
  Administered 2016-01-25 – 2016-01-27 (×3): 40 [IU] via SUBCUTANEOUS
  Filled 2016-01-25 (×4): qty 0.4

## 2016-01-25 MED ORDER — FENTANYL CITRATE (PF) 100 MCG/2ML IJ SOLN
INTRAMUSCULAR | Status: AC
  Filled 2016-01-25: qty 2

## 2016-01-25 MED ORDER — IPRATROPIUM-ALBUTEROL 0.5-2.5 (3) MG/3ML IN SOLN
3.0000 mL | RESPIRATORY_TRACT | Status: DC
  Administered 2016-01-25 – 2016-01-28 (×18): 3 mL via RESPIRATORY_TRACT
  Filled 2016-01-25 (×17): qty 3

## 2016-01-25 MED ORDER — LEVOFLOXACIN IN D5W 750 MG/150ML IV SOLN
750.0000 mg | INTRAVENOUS | Status: DC
  Administered 2016-01-26: 750 mg via INTRAVENOUS
  Filled 2016-01-25: qty 150

## 2016-01-25 MED ORDER — HYDROCOD POLST-CPM POLST ER 10-8 MG/5ML PO SUER
5.0000 mL | Freq: Two times a day (BID) | ORAL | Status: DC
  Administered 2016-01-25 – 2016-01-28 (×6): 5 mL via ORAL
  Filled 2016-01-25 (×6): qty 5

## 2016-01-25 MED ORDER — ONDANSETRON HCL 4 MG/2ML IJ SOLN
INTRAMUSCULAR | Status: AC
  Filled 2016-01-25: qty 2

## 2016-01-25 MED ORDER — INSULIN ASPART 100 UNIT/ML ~~LOC~~ SOLN: 0.0000 [IU] | Freq: Every day | SUBCUTANEOUS | Status: DC

## 2016-01-25 MED ORDER — CHOLECALCIFEROL 10 MCG (400 UNIT) PO TABS
400.0000 [IU] | ORAL_TABLET | Freq: Every day | ORAL | Status: DC
  Administered 2016-01-25 – 2016-01-27 (×3): 400 [IU] via ORAL
  Filled 2016-01-25 (×2): qty 1

## 2016-01-25 MED ORDER — GUAIFENESIN ER 600 MG PO TB12
600.0000 mg | ORAL_TABLET | Freq: Two times a day (BID) | ORAL | Status: DC
  Administered 2016-01-25 – 2016-01-28 (×7): 600 mg via ORAL
  Filled 2016-01-25 (×8): qty 1

## 2016-01-25 MED ORDER — PAROXETINE HCL 20 MG PO TABS
20.0000 mg | ORAL_TABLET | Freq: Every day | ORAL | Status: DC
  Administered 2016-01-25 – 2016-01-28 (×4): 20 mg via ORAL
  Filled 2016-01-25 (×4): qty 1

## 2016-01-25 MED ORDER — CALCIUM CARBONATE-VITAMIN D 600-400 MG-UNIT PO TABS: 1.0000 | ORAL_TABLET | Freq: Two times a day (BID) | ORAL | Status: DC

## 2016-01-25 MED ORDER — LIDOCAINE 5 % EX PTCH
1.0000 | MEDICATED_PATCH | CUTANEOUS | Status: DC
  Administered 2016-01-25 – 2016-01-28 (×5): 1 via TRANSDERMAL
  Filled 2016-01-25 (×5): qty 1

## 2016-01-25 MED ORDER — DEXTROSE 5 % IV SOLN
1.0000 g | Freq: Three times a day (TID) | INTRAVENOUS | Status: DC
  Administered 2016-01-25: 1 g via INTRAVENOUS
  Filled 2016-01-25 (×3): qty 1

## 2016-01-25 MED ORDER — TRAMADOL HCL 50 MG PO TABS
50.0000 mg | ORAL_TABLET | Freq: Four times a day (QID) | ORAL | Status: DC | PRN
  Administered 2016-01-25 – 2016-01-27 (×3): 50 mg via ORAL
  Filled 2016-01-25 (×3): qty 1

## 2016-01-25 MED ORDER — ENOXAPARIN SODIUM 40 MG/0.4ML ~~LOC~~ SOLN
40.0000 mg | SUBCUTANEOUS | Status: DC
  Administered 2016-01-25: 40 mg via SUBCUTANEOUS
  Filled 2016-01-25: qty 0.4

## 2016-01-25 MED ORDER — APPLE CIDER VINEGAR PO TABS: ORAL_TABLET | Freq: Two times a day (BID) | ORAL | Status: DC

## 2016-01-25 MED ORDER — LEVOFLOXACIN IN D5W 750 MG/150ML IV SOLN
750.0000 mg | Freq: Once | INTRAVENOUS | Status: AC
Start: 2016-01-25 — End: 2016-01-25
  Administered 2016-01-25: 750 mg via INTRAVENOUS
  Filled 2016-01-25: qty 150

## 2016-01-25 MED ORDER — LEVOFLOXACIN IN D5W 750 MG/150ML IV SOLN: 750.0000 mg | Freq: Once | INTRAVENOUS | Status: DC

## 2016-01-25 MED ORDER — FENTANYL CITRATE (PF) 100 MCG/2ML IJ SOLN
50.0000 ug | Freq: Once | INTRAMUSCULAR | Status: AC
  Administered 2016-01-25: 50 ug via INTRAVENOUS
  Filled 2016-01-25: qty 2

## 2016-01-25 NOTE — Progress Notes (Signed)
PHARMACIST - PHYSICIAN ORDER COMMUNICATION  CONCERNING: P&T Medication Policy on Herbal Medications  DESCRIPTION:  This patient's order for:  Cinnamon and Apple cider vinegar tabs  has been noted.  This product(s) is classified as an "herbal" or natural product. Due to a lack of definitive safety studies or FDA approval, nonstandard manufacturing practices, plus the potential risk of unknown drug-drug interactions while on inpatient medications, the Pharmacy and Therapeutics Committee does not permit the use of "herbal" or natural products of this type within [facility].   ACTION TAKEN: The pharmacy department is unable to verify this order at this time. Please reevaluate patient's clinical condition at discharge and address if the herbal or natural product(s) should be resumed at that time.

## 2016-01-25 NOTE — Progress Notes (Signed)
Anticoagulation monitoring(Lovenox):  75 yo  ordered Lovenox 40 mg Q24h  Filed Weights   01/24/16 2359  Weight: 260 lb (117.935 kg)   Body mass index is 44.61 kg/(m^2).   Lab Results  Component Value Date   CREATININE 1.58* 01/25/2016   CREATININE 1.78* 07/24/2015   CREATININE 1.58* 07/23/2015   Estimated Creatinine Clearance: 38.9 mL/min (by C-G formula based on Cr of 1.58). Hemoglobin & Hematocrit     Component Value Date/Time   HGB 10.3* 01/25/2016 0043   HGB 10.3* 10/16/2014 1135   HCT 32.1* 01/25/2016 0043   HCT 33.4* 10/16/2014 1135     Per Protocol for Patient with estCrcl> 30 ml/min and BMI > 40, will transition to Lovenox 40 mg Q12h.

## 2016-01-25 NOTE — H&P (Signed)
[facility] NAME: Deborah Obrien    MR#:  [id number]  DATE OF BIRTH:  Jul 03, 1941  DATE OF ADMISSION:  01/25/2016  PRIMARY CARE PHYSICIAN: [name]., MD   REQUESTING/REFERRING PHYSICIAN:   CHIEF COMPLAINT:   Chief Complaint  Patient presents with  . Fever  . Cough  . Abdominal Pain  . Emesis  . Diarrhea    HISTORY OF PRESENT ILLNESS: Deborah Obrien  is a 75 y.o. female with a known history of Hypertension, diabetes mellitus, chronic kidney disease stage stage IV, proximal atrial fibrillation, osteoarthritis presented to the emergency room with fever and chills for 1 day duration. Patient is generalized body aches and weakness. Patient also complains of productive cough of yellowish phlegm. Patient was evaluated in the emergency room was found to have low blood pressure and was given IV fluids based on sepsis protocol. She had fever and was started on IV antibiotics. Workup was done with a CT abdomen which showed right lung pneumonia. No history of fall or head injury. No history of any headache, dizziness or blurry vision. Hospitalist service was consulted for further care. No history of recent travel or sick contacts at home. Patient is up-to-date with flu vaccination pneumococcal vaccination.  PAST MEDICAL HISTORY:   Past Medical History  Diagnosis Date  . Diabetes mellitus without complication ([place])   . Hypertension   . Morbid obesity ([place])   . Chronic kidney disease (CKD) stage G3a/A1, moderately decreased glomerular filtration rate (GFR) between 45-59 mL/min/1.73 square meter and albuminuria creatinine ratio less than 30 mg/g   . Paroxysmal atrial fibrillation ([facility])   . Breast mass x 1 month    3-4 o'clock left  . Anxiety   . Acid reflux   . Fatty liver   . Osteoarthritis   . TIA (transient ischemic attack)   . GI bleed   . Bowel obstruction ([place])     PAST SURGICAL HISTORY: Past Surgical History   Procedure Laterality Date  . Shoulder arthroscopy with subacromial decompression Left   . Esophagogastroduodenoscopy N/A 01/27/2015    Procedure: ESOPHAGOGASTRODUODENOSCOPY (EGD);  Surgeon: Lollie Sails, MD;  Location: [facility] ENDOSCOPY;  Service: Endoscopy;  Laterality: N/A;  . Colon surgery    . 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 Esophagogastroduodenoscopy (egd) with propofol N/A 07/24/2015    Procedure: ESOPHAGOGASTRODUODENOSCOPY (EGD) WITH PROPOFOL;  Surgeon: Lucilla Lame, MD;  Location: [facility] ENDOSCOPY;  Service: Endoscopy;  Laterality: N/A;  . Breast biopsy Right 08/16    stereo fibroadenomatous change, neg for atypia  . Back surgery    . Replacement total knee bilateral      SOCIAL HISTORY:  Social History  Substance Use Topics  . Smoking status: Never Smoker   . Smokeless tobacco: Not on file  . Alcohol Use: No    FAMILY HISTORY:  Family History  Problem Relation Age of Onset  . Diabetes Mellitus II Mother   . CAD Mother   . Breast cancer Neg Hx     DRUG ALLERGIES:  Allergies  Allergen Reactions  . Cephalosporins Other (See Comments)    Reaction:  Unknown   . Hydrocodone-Acetaminophen Hives  . Macrolides And Ketolides Other (See Comments)    Reaction:  Unknown   . Meperidine Shortness Of Breath, Nausea Only and Other (See Comments)    Reaction:  Stomach  pain   . Shellfish Allergy Shortness Of Breath, Diarrhea, Nausea Only, Rash and Other (See Comments)    Reaction:  Stomach pain   . Sulfa Antibiotics Shortness Of Breath, Diarrhea, Nausea Only and Other (See Comments)    Reaction:  Stomach pain   . Aspirin Other (See Comments)    Reaction:  Stomach pain   . Celecoxib Other (See Comments)    Reaction:  GI bleed, weakness, and stomach pain.    . Cephalexin Diarrhea, Nausea Only and Other (See Comments)    Reaction:  Stomach  pain   . Erythromycin Diarrhea, Nausea Only and Other (See Comments)    Reaction:  Stomach pain   . Hydromorphone Other (See Comments)    Reaction:  Hypotension   . Iodinated Diagnostic Agents Rash and Other (See Comments)    Pt states that she is unable to have because she has chronic kidney disease.    Marland Kitchen Oxycodone Other (See Comments)    Reaction:  Stomach pain   . Atorvastatin Nausea Only and Other (See Comments)    Reaction:  Weakness   . Codeine Diarrhea, Nausea Only and Other (See Comments)    Reaction:  Stomach pain   . Prednisone Other (See Comments)    Reaction:  Increases pts blood sugar  Pt states that she is allergic to all steroids.    . Tape Other (See Comments)    Reaction:  Causes pts skin to tear  Pt states that she is able to use paper tape.    . Valdecoxib Swelling and Other (See Comments)    Pt states that her hands and feet swell.    . Iodine Rash    REVIEW OF SYSTEMS:   CONSTITUTIONAL: Has fever,chills, and weakness.  EYES: No blurred or double vision.  EARS, NOSE, AND THROAT: No tinnitus or ear pain.  RESPIRATORY: Has cough, shortness of breath, no wheezing or hemoptysis.  CARDIOVASCULAR: No chest pain, orthopnea, edema.  GASTROINTESTINAL: No nausea, vomiting, diarrhea or abdominal pain.  GENITOURINARY: No dysuria, hematuria.  ENDOCRINE: No polyuria, nocturia,  HEMATOLOGY: No anemia, easy bruising or bleeding SKIN: No rash or lesion. MUSCULOSKELETAL: No joint pain or arthritis.   NEUROLOGIC: No tingling, numbness, weakness.  PSYCHIATRY: No anxiety or depression.   MEDICATIONS AT HOME:  Prior to Admission medications   Medication Sig Start Date End Date Taking? Authorizing Provider  APPLE CIDER VINEGAR PO Take 1 tablet by mouth 2 (two) times daily.   Yes Historical Provider, MD  aspirin EC 81 MG tablet Take 81 mg by mouth at bedtime.   Yes Historical Provider, MD  Calcium Carbonate-Vitamin D (CALCIUM 600+D) 600-400 MG-UNIT tablet Take 1 tablet by  mouth 2 (two) times daily.   Yes Historical Provider, MD  Cholecalciferol (VITAMIN D3) 400 UNITS tablet Take 400-800 Units by mouth 2 (two) times daily. Pt takes two tablets in the morning and one tablet at bedtime.   Yes Historical Provider, MD  CINNAMON PO Take 2 tablets by mouth 2 (two) times daily.   Yes Historical Provider, MD  Cranberry-Vitamin C (AZO CRANBERRY URINARY TRACT) 250-60 MG CAPS Take 2 capsules by mouth 2 (two) times daily.   Yes Historical Provider, MD  docusate sodium (COLACE) 100 MG capsule Take 200 mg by mouth at bedtime.   Yes Historical Provider, MD  doxycycline (VIBRA-TABS) 100 MG tablet Take 1 tablet by mouth 2 (two) times daily. 01/15/16  Yes Historical Provider, MD  furosemide (LASIX) 40 MG tablet Take 40 mg by  mouth daily as needed for edema.   Yes Historical Provider, MD  hydrocortisone 2.5 % cream Apply 1 application topically at bedtime as needed (for rash under breasts).    Yes Historical Provider, MD  insulin aspart (NOVOLOG) 100 UNIT/ML injection Inject 15-35 Units into the skin 3 (three) times daily before meals. Pt uses as needed per sliding scale:    0-150:  15 units  151-200:  17 units  201-250:  19 units  251-300:  23 units  301-350:  27 units  351-400:  31 units  401 and greater:  35 units   Yes Historical Provider, MD  insulin detemir (LEVEMIR) 100 UNIT/ML injection Inject 40 Units into the skin at bedtime.   Yes Historical Provider, MD  ketoconazole (NIZORAL) 2 % cream Apply 1 application topically every 2 (two) hours as needed (for rash under breasts).    Yes Historical Provider, MD  metoprolol tartrate (LOPRESSOR) 25 MG tablet Take 0.5 tablets (12.5 mg total) by mouth daily. 07/25/15  Yes Epifanio Lesches, MD  Multiple Vitamin (MULTIVITAMIN WITH MINERALS) TABS tablet Take 1 tablet by mouth daily.   Yes Historical Provider, MD  mupirocin cream (BACTROBAN) 2 % Apply 1 application topically daily as needed. 01/15/16  Yes Historical Provider, MD   oxybutynin (DITROPAN) 5 MG tablet Take 1 tablet by mouth 3 (three) times daily. 12/28/15  Yes Historical Provider, MD  oxybutynin (DITROPAN-XL) 10 MG 24 hr tablet Take 10 mg by mouth at bedtime.   Yes Historical Provider, MD  pantoprazole (PROTONIX) 40 MG tablet Take 40 mg by mouth 2 (two) times daily.   Yes Historical Provider, MD  PARoxetine (PAXIL) 20 MG tablet Take 20 mg by mouth daily.   Yes Historical Provider, MD  pravastatin (PRAVACHOL) 20 MG tablet Take 20 mg by mouth at bedtime.    Yes Historical Provider, MD  sucralfate (CARAFATE) 1 G tablet Take 1 g by mouth 2 (two) times daily.    Yes Historical Provider, MD  talc (ZEASORB) powder Apply 1 application topically as needed (for rash under breasts).   Yes Historical Provider, MD  traZODone (DESYREL) 50 MG tablet Take 2 tablets by mouth at bedtime. 12/28/15  Yes Historical Provider, MD  simvastatin (ZOCOR) 20 MG tablet Take 0.5 tablets by mouth daily. 01/07/16   Historical Provider, MD      PHYSICAL EXAMINATION:   VITAL SIGNS: Blood pressure 107/49, pulse 62, temperature 101 F (38.3 C), temperature source Oral, resp. rate 13, height 5\' 4"  (1.626 m), weight 117.935 kg (260 lb), SpO2 96 %.  GENERAL:  74 y.o.-year-old patient lying in the bed with no acute distress.  EYES: Pupils equal, round, reactive to light and accommodation. No scleral icterus. Extraocular muscles intact.  HEENT: Head atraumatic, normocephalic. Oropharynx and nasopharynx clear.  NECK:  Supple, no jugular venous distention. No thyroid enlargement, no tenderness.  LUNGS: Decreased breath sounds bilaterally, rales heard in the right lung. No use of accessory muscles of respiration.  CARDIOVASCULAR: S1, S2 normal. No murmurs, rubs, or gallops.  ABDOMEN: Soft, nontender, nondistended. Bowel sounds present. No organomegaly or mass.  EXTREMITIES: No pedal edema, cyanosis, or clubbing.  NEUROLOGIC: Cranial nerves II through XII are intact. Muscle strength 5/5 in all  extremities. Sensation intact. Gait normal. PSYCHIATRIC: The patient is alert and oriented x 3.  SKIN: No obvious rash, lesion, or ulcer.   LABORATORY PANEL:   CBC  Recent Labs Lab 01/25/16 0043  WBC 5.2  HGB 10.3*  HCT 32.1*  PLT 170  MCV 81.5  MCH 26.2  MCHC 32.1  RDW 16.9*  LYMPHSABS 0.8*  MONOABS 0.5  EOSABS 0.0  BASOSABS 0.0   ------------------------------------------------------------------------------------------------------------------  Chemistries   Recent Labs Lab 01/25/16 0043  NA 135  K 4.0  CL 101  CO2 25  GLUCOSE 204*  BUN 33*  CREATININE 1.58*  CALCIUM 8.3*  AST 59*  ALT 28  ALKPHOS 131*  BILITOT 1.5*   ------------------------------------------------------------------------------------------------------------------ estimated creatinine clearance is 38.9 mL/min (by C-G formula based on Cr of 1.58). ------------------------------------------------------------------------------------------------------------------ No results for input(s): TSH, T4TOTAL, T3FREE, THYROIDAB in the last 72 hours.  Invalid input(s): FREET3   Coagulation profile No results for input(s): INR, PROTIME in the last 168 hours. ------------------------------------------------------------------------------------------------------------------- No results for input(s): DDIMER in the last 72 hours. -------------------------------------------------------------------------------------------------------------------  Cardiac Enzymes  Recent Labs Lab 01/25/16 0043  TROPONINI <0.03   ------------------------------------------------------------------------------------------------------------------ Invalid input(s): POCBNP  ---------------------------------------------------------------------------------------------------------------  Urinalysis    Component Value Date/Time   COLORURINE YELLOW* 01/25/2016 0220   COLORURINE Yellow 10/29/2012 1534   APPEARANCEUR CLEAR*  01/25/2016 0220   APPEARANCEUR Hazy 10/29/2012 1534   LABSPEC 1.017 01/25/2016 0220   LABSPEC 1.010 10/29/2012 1534   PHURINE 5.0 01/25/2016 0220   PHURINE 9.0 10/29/2012 1534   GLUCOSEU NEGATIVE 01/25/2016 0220   GLUCOSEU Negative 10/29/2012 1534   HGBUR NEGATIVE 01/25/2016 0220   HGBUR Negative 10/29/2012 1534   BILIRUBINUR NEGATIVE 01/25/2016 0220   BILIRUBINUR Negative 10/29/2012 1534   KETONESUR NEGATIVE 01/25/2016 0220   KETONESUR Trace 10/29/2012 1534   PROTEINUR NEGATIVE 01/25/2016 0220   PROTEINUR Negative 10/29/2012 1534   NITRITE NEGATIVE 01/25/2016 0220   NITRITE Negative 10/29/2012 1534   LEUKOCYTESUR NEGATIVE 01/25/2016 0220   LEUKOCYTESUR Negative 10/29/2012 1534     RADIOLOGY: Ct Abdomen Pelvis Wo Contrast  01/25/2016  CLINICAL DATA:  Cough for 4 days, fever for 1 day, abdominal pain with nausea, vomiting and diarrhea for 3 days. Pre syncopal episode tonight, hypoglycemia. History of diabetes, chronic kidney disease, hypertension, bowel obstruction. EXAM: CT ABDOMEN AND PELVIS WITHOUT CONTRAST TECHNIQUE: Multidetector CT imaging of the abdomen and pelvis was performed following the standard protocol without IV contrast. Iodine allergy. COMPARISON:  CT abdomen and pelvis July 22, 2015 FINDINGS: Large body habitus results in overall noisy image quality. LUNG BASES: Tree-in-bud airspace opacities RIGHT middle lobe and RIGHT lower lobe lobe, patchy bilateral lower lobe airspace opacities. Heart size is upper limits of normal. No pericardial fusion. Cardiac pacer wires in place. KIDNEYS/BLADDER: Kidneys are orthotopic, demonstrating normal size and morphology. No nephrolithiasis, hydronephrosis; limited assessment for renal masses on this nonenhanced examination. The unopacified ureters are normal in course and caliber. Urinary bladder is well distended and unremarkable. SOLID ORGANS: The liver demonstrates calcified granuloma, otherwise unremarkable. Spleen, and adrenal glands  are unremarkable for this non-contrast examination. Status post cholecystectomy. Atrophic pancreas. GASTROINTESTINAL TRACT: The stomach, small and large bowel are normal in course and caliber without inflammatory changes, the sensitivity may be decreased by lack of enteric contrast. Rectosigmoid bowel anastomosis. Surgical clips in the pelvis. Status post apparent appendectomy. PERITONEUM/RETROPERITONEUM: Aortoiliac vessels are normal in course and caliber, moderate calcific atherosclerosis. No lymphadenopathy by CT size criteria. Status post hysterectomy. No intraperitoneal free fluid nor free air. SOFT TISSUES/ OSSEOUS STRUCTURES: Nonsuspicious. L4 through S1 PLIF. Mild periumbilical subcutaneous skin thickening is similar to prior CT. IMPRESSION: No acute intra-abdominal or pelvic process.  No bowel obstruction. RIGHT lung base tree-in-bud infiltrates concerning for infectious process with lower lobe consolidation/pneumonia. Electronically Signed   By: Sandie Ano  Bloomer M.D.   On: 01/25/2016 05:20   Dg Chest Port 1 View  01/25/2016  CLINICAL DATA:  75 year old female with sepsis EXAM: PORTABLE CHEST 1 VIEW COMPARISON:  Chest radiograph dated 07/21/2015 FINDINGS: Single portable view of the chest does not demonstrate a focal consolidation. There is no pleural effusion or pneumothorax. Stable mild cardiomegaly. Left pectoral pacemaker device. Lower cervical fixation plate and screw. No acute fracture. IMPRESSION: No active disease. Electronically Signed   By: Anner Crete M.D.   On: 01/25/2016 01:20    EKG: Orders placed or performed during the hospital encounter of 01/25/16  . EKG 12-Lead  . EKG 12-Lead    IMPRESSION AND PLAN: 75 year old female patient with history of chronic kidney disease stage IV, hypertension, diabetes mellitus, osteoarthritis presented to the emergency room with fever and chills and cough. Admitting diagnosis 1. Community-acquired pneumonia 2. Sepsis secondary to  pneumonia 3. Chronic kidney disease 4. Hypotension Treatment plan Admit patient to medical floor IV fluid resuscitation based on sepsis protocol Start patient on IV vancomycin and IV Levaquin antibiotic Follow-up cultures Follow-up renal function DVT prophylaxis with subcutaneous Lovenox Supportive care.  All the records are reviewed and case discussed with ED provider. Management plans discussed with the patient, family and they are in agreement.  CODE STATUS:FULL    Code Status Orders        Start     Ordered   01/25/16 0621  Full code   Continuous     01/25/16 0622    Code Status History    Date Active Date Inactive Code Status Order ID Comments User Context   07/22/2015  1:07 AM 07/25/2015  4:09 PM Full Code [id number]  Harrie Foreman, MD Inpatient   07/01/2015  4:05 PM 07/03/2015  4:59 PM Full Code [id number]  Isaias Cowman, MD Inpatient   06/29/2015  5:51 PM 07/01/2015  4:05 PM Full Code [id number]  Fritzi Mandes, MD Inpatient   02/20/2015 11:22 PM 02/25/2015  5:53 PM Full Code [id number]  Marlyce Huge, MD ED       TOTAL TIME TAKING CARE OF THIS PATIENT: 53 minutes.    Saundra Shelling M.D on 01/25/2016 at 6:23 AM  Between 7am to 6pm - Pager - [phone]  After 6pm go to [email or web address] - password [email or web address]  Office  [phone]  CC: Primary care physician; [name]., MD

## 2016-01-25 NOTE — ED Provider Notes (Signed)
[facility] Emergency Department Provider Note  Time seen: 12:35 AM  I have reviewed the triage vital signs and the nursing notes.   HISTORY  Chief Complaint Fever; Cough; Abdominal Pain; Emesis; and Diarrhea    HPI Deborah Obrien is a 75 y.o. female with a past medical history of diabetes, hypertension, obesity, CK D, presents the emergency department with complaints of fever abdominal pain and cough. According to the patient for the past 3 days she has been feeling congested with significant cough. She also states for the past 2 days she has been experiencing upper abdominal pain, nausea and vomiting. Patient states occasional episodes of diarrhea. Denies dysuria. Patient began feeling warm and took her temperature today and was noted to have a fever so she came to the emergency department. Patient describes abdominal pain is located in the epigastrium to left upper quadrant, moderate in severity, dull aching sensation. States she has had a significant cough but denies sputum production. States today's the first day she is aware of a fever.Patient states she had a near syncopal episode this evening due to generalized weakness.     Past Medical History  Diagnosis Date  . Diabetes mellitus without complication ([place])   . Hypertension   . Morbid obesity ([place])   . Chronic kidney disease (CKD) stage G3a/A1, moderately decreased glomerular filtration rate (GFR) between 45-59 mL/min/1.73 square meter and albuminuria creatinine ratio less than 30 mg/g   . Paroxysmal atrial fibrillation (HCC)   . Breast mass x 1 month    3-4 o'clock left  . Anxiety   . Acid reflux   . Fatty liver   . Osteoarthritis   . TIA (transient ischemic attack)   . GI bleed   . Bowel obstruction [facility])     Patient Active Problem List   Diagnosis Date Noted  . Gastritis   . Gastric polyp   . Esophageal reflux   . Abdominal pain, epigastric   . Sepsis ([place]) 07/22/2015  . A-fib ([place])  06/29/2015  . Intestinal obstruction ([place]) 02/20/2015    Past Surgical History  Procedure Laterality Date  . Shoulder arthroscopy with subacromial decompression Left   . Esophagogastroduodenoscopy N/A 01/27/2015    Procedure: ESOPHAGOGASTRODUODENOSCOPY (EGD);  Surgeon: Lollie Sails, MD;  Location: [facility] ENDOSCOPY;  Service: Endoscopy;  Laterality: N/A;  . Colon surgery    . 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 Esophagogastroduodenoscopy (egd) with propofol N/A 07/24/2015    Procedure: ESOPHAGOGASTRODUODENOSCOPY (EGD) WITH PROPOFOL;  Surgeon: Lucilla Lame, MD;  Location: [facility] ENDOSCOPY;  Service: Endoscopy;  Laterality: N/A;  . Breast biopsy Right 08/16    stereo fibroadenomatous change, neg for atypia  . Back surgery    . Replacement total knee bilateral      Current Outpatient Rx  Name  Route  Sig  Dispense  Refill  . APPLE CIDER VINEGAR PO   Oral   Take 1 tablet by mouth 2 (two) times daily.         Marland Kitchen aspirin EC 81 MG tablet   Oral   Take 81 mg by mouth at bedtime.         . Calcium Carbonate-Vitamin D (CALCIUM 600+D) 600-400 MG-UNIT tablet   Oral   Take 1 tablet by mouth 2 (two) times daily.         . Cholecalciferol (VITAMIN D3) 400 UNITS  tablet   Oral   Take 400-800 Units by mouth 2 (two) times daily. Pt takes two tablets in the morning and one tablet at bedtime.         Marland Kitchen CINNAMON PO   Oral   Take 2 tablets by mouth 2 (two) times daily.         . Cranberry-Vitamin C (AZO CRANBERRY URINARY TRACT) 250-60 MG CAPS   Oral   Take 2 capsules by mouth 2 (two) times daily.         Marland Kitchen docusate sodium (COLACE) 100 MG capsule   Oral   Take 200 mg by mouth at bedtime.         . furosemide (LASIX) 40 MG tablet   Oral   Take 40 mg by mouth daily as needed for edema.         . hydrocortisone 2.5 % cream   Topical    Apply 1 application topically at bedtime as needed (for rash under breasts).          . insulin aspart (NOVOLOG) 100 UNIT/ML injection   Subcutaneous   Inject 15-35 Units into the skin 3 (three) times daily before meals. Pt uses as needed per sliding scale:    0-150:  15 units  151-200:  17 units  201-250:  19 units  251-300:  23 units  301-350:  27 units  351-400:  31 units  401 and greater:  35 units         . insulin detemir (LEVEMIR) 100 UNIT/ML injection   Subcutaneous   Inject 40 Units into the skin at bedtime.         Marland Kitchen ketoconazole (NIZORAL) 2 % cream   Topical   Apply 1 application topically every 2 (two) hours as needed (for rash under breasts).          . Multiple Vitamin (MULTIVITAMIN WITH MINERALS) TABS tablet   Oral   Take 1 tablet by mouth daily.         Marland Kitchen oxybutynin (DITROPAN-XL) 10 MG 24 hr tablet   Oral   Take 10 mg by mouth at bedtime.         . pantoprazole (PROTONIX) 40 MG tablet   Oral   Take 40 mg by mouth 2 (two) times daily.         Marland Kitchen PARoxetine (PAXIL) 20 MG tablet   Oral   Take 20 mg by mouth daily.         . pravastatin (PRAVACHOL) 20 MG tablet   Oral   Take 20 mg by mouth at bedtime.          . sucralfate (CARAFATE) 1 G tablet   Oral   Take 1 g by mouth 2 (two) times daily.          Marland Kitchen talc (ZEASORB) powder   Topical   Apply 1 application topically as needed (for rash under breasts).         . ALPRAZolam (XANAX) 0.5 MG tablet   Oral   Take 1 tablet (0.5 mg total) by mouth at bedtime as needed for anxiety or sleep.   10 tablet   0   . amiodarone (PACERONE) 400 MG tablet   Oral   Take 1 tablet (400 mg total) by mouth daily.   30 tablet   0   . amoxicillin (AMOXIL) 500 MG capsule   Oral   Take 1 capsule (500 mg total) by mouth 2 (two) times daily.  20 capsule   0   . metoprolol tartrate (LOPRESSOR) 25 MG tablet   Oral   Take 0.5 tablets (12.5 mg total) by mouth daily.   20 tablet   0      Allergies Cephalosporins; Hydrocodone-acetaminophen; Macrolides and ketolides; Meperidine; Shellfish allergy; Sulfa antibiotics; Aspirin; Celecoxib; Cephalexin; Erythromycin; Hydromorphone; Iodinated diagnostic agents; Oxycodone; Atorvastatin; Codeine; Prednisone; Tape; Valdecoxib; and Iodine  Family History  Problem Relation Age of Onset  . Diabetes Mellitus II Mother   . CAD Mother   . Breast cancer Neg Hx     Social History Social History  Substance Use Topics  . Smoking status: Never Smoker   . Smokeless tobacco: Not on file  . Alcohol Use: No    Review of Systems Constitutional: Positive for fever ENT: Positive for nasal congestion Cardiovascular: Negative for chest pain. Respiratory: Positive for cough Gastrointestinal: Positive for upper abdominal pain, nausea, vomiting, diarrhea. Genitourinary: Negative for dysuria. Musculoskeletal: Negative for back pain Neurological: Negative for headache 10-point ROS otherwise negative.  ____________________________________________   PHYSICAL EXAM:  VITAL SIGNS: ED Triage Vitals  Enc Vitals Group     BP 01/24/16 2359 97/54 mmHg     Pulse Rate 01/24/16 2359 69     Resp 01/24/16 2359 20     Temp 01/24/16 2359 101 F (38.3 C)     Temp Source 01/24/16 2359 Oral     SpO2 01/24/16 2359 94 %     Weight 01/24/16 2359 260 lb (117.935 kg)     Height 01/24/16 2359 5\' 4"  (1.626 m)     Head Cir --      Peak Flow --      Pain Score 01/25/16 0000 8     Pain Loc --      Pain Edu? --      Excl. in [place]? --     Constitutional: Alert and oriented. Well appearing and in no distress. Eyes: Normal exam ENT   Head: Normocephalic and atraumatic.   Mouth/Throat: Mucous membranes are moist. Cardiovascular: Normal rate, regular rhythm. Respiratory: Normal respiratory effort without tachypnea nor retractions. Breath sounds are clear . Occasional cough during exam. Gastrointestinal:  Soft, moderate epigastric tenderness to  palpation without rebound or guarding. No distention. Musculoskeletal: Nontender with normal range of motion in all extremities.. Patient does have a splint to the left lower extremity which she states she takes on and off. Neurologic:  Normal speech and language. No gross focal neurologic deficits Skin:  Skin is warm, dry and intact.  Psychiatric: Mood and affect are normal.  ____________________________________________    EKG  EKG reviewed and interpreted by myself shows ventricular paced rhythm at 65 bpm, widened QRS with nonspecific ST changes.  ____________________________________________    RADIOLOGY  Chest x-ray shows no acute abnormality CT scan of the abdomen/pelvis shows no acute abnormality in the abdomen however there does appear to be a right lower lobe consolidation consistent with pneumonia.  ____________________________________________    INITIAL IMPRESSION / ASSESSMENT AND PLAN / ED COURSE  Pertinent labs & imaging results that were available during my care of the patient were reviewed by me and considered in my medical decision making (see chart for details).  Patient presents the emergency department with fever to 101, blood pressure of 97/54, concerning for possible sepsis. Given the patient's significant comorbidities with significant cough and fever this evening I have ordered code sepsis protocols. We will begin empiric antibiotics as well as IV fluids while waiting lab and imaging results.  Patient's labs are largely within normal limits including white blood cell count and lactic acid. Patient's chest x-ray is negative, however her CT of the abdomen/pelvis shows what appears to be a right lower lobe pneumonia. This would account for the patient's increased cough as well as fever. Patient continues to be quite nauseated, continues to have significant pain. Patient has received several rounds of anti-medics. Given the patient's age, comorbidities, fever with  pneumonia we will admit to the hospital for further treatment. Patient agreeable to plan.  ____________________________________________   FINAL CLINICAL IMPRESSION(S) / ED DIAGNOSES   abdominal pain Cough Fever Pneumonia  Harvest Dark, MD 01/25/16 [id number]

## 2016-01-25 NOTE — ED Notes (Signed)
Physician was paged 10 minutes ago regarding patient's pain, but no return phone call so far.

## 2016-01-25 NOTE — Progress Notes (Signed)
[place] [facility] [place] NAME: Deborah Obrien    MR#:  [id number]  DATE OF BIRTH:  Oct 14, 1940  SUBJECTIVE:  CHIEF COMPLAINT:   Chief Complaint  Patient presents with  . Fever  . Cough  . Abdominal Pain  . Emesis  . Diarrhea   Patient is a 75 year old Caucasian female with past medical history significant for history of diabetes, insulin-dependent dependent. Neck supple. Hypertension, Morbid obesity,  CKD stage 3, paroxysmal atrial fibrillation who presents to the hospital with complaints of fever, cough, abdominal pain, emesis and diarrhea. Patient admitted of her body aches and weakness, yellow phlegm production. In emergency room, she was noted to be hypotensive and was initiated on IV fluids on sepsis protocol. She had high fevers to about 101, broad-spectrum antibiotics were initiated after blood cultures are taken. CT scan of abdomen revealed right lower lobe pneumonia. Patient is admitted for pneumonia therapy Review of Systems  Constitutional: Negative for fever, chills and weight loss.  HENT: Negative for congestion.   Eyes: Negative for blurred vision and double vision.  Respiratory: Positive for cough, sputum production, shortness of breath and wheezing.   Cardiovascular: Positive for chest pain. Negative for palpitations, orthopnea, leg swelling and PND.  Gastrointestinal: Positive for abdominal pain. Negative for nausea, vomiting, diarrhea, constipation and blood in stool.  Genitourinary: Negative for dysuria, urgency, frequency and hematuria.  Musculoskeletal: Negative for falls.  Neurological: Negative for dizziness, tremors, focal weakness and headaches.  Endo/Heme/Allergies: Does not bruise/bleed easily.  Psychiatric/Behavioral: Negative for depression. The patient does not have insomnia.     VITAL SIGNS: Blood pressure 130/57, pulse 65, temperature 99.2 F (37.3 C), temperature source Oral, resp. rate 18, height 5\' 4"   (1.626 m), weight 117.935 kg (260 lb), SpO2 94 %.  PHYSICAL EXAMINATION:   GENERAL:  75 y.o.-year-old patient lying in the bed , moderate distress due to cough, discomfort in left chest, especially with cough.  EYES: Pupils equal, round, reactive to light and accommodation. No scleral icterus. Extraocular muscles intact.  HEENT: Head atraumatic, normocephalic. Oropharynx and nasopharynx clear.  NECK:  Supple, no jugular venous distention. No thyroid enlargement, no tenderness.  LUNGS: Decreased breath sounds bilaterally, intermittent wheezing at bases, more expiratory, few rales,rhonchi and crepitations at both bases, more on the right side. Intermittent use of accessory muscles of respiration, especially with long sentences.  CARDIOVASCULAR: S1, S2 normal. No murmurs, rubs, or gallops.  ABDOMEN: Soft, nontender, nondistended. Bowel sounds present. No organomegaly or mass.  EXTREMITIES: Trace pedal edema, no cyanosis, or clubbing.  NEUROLOGIC: Cranial nerves II through XII are intact. Muscle strength 5/5 in all extremities. Sensation intact. Gait not checked.  PSYCHIATRIC: The patient is alert and oriented x 3.  SKIN: No obvious rash, lesion, or ulcer.   ORDERS/RESULTS REVIEWED:   CBC  Recent Labs Lab 01/25/16 0043  WBC 5.2  HGB 10.3*  HCT 32.1*  PLT 170  MCV 81.5  MCH 26.2  MCHC 32.1  RDW 16.9*  LYMPHSABS 0.8*  MONOABS 0.5  EOSABS 0.0  BASOSABS 0.0   ------------------------------------------------------------------------------------------------------------------  Chemistries   Recent Labs Lab 01/25/16 0043  NA 135  K 4.0  CL 101  CO2 25  GLUCOSE 204*  BUN 33*  CREATININE 1.58*  CALCIUM 8.3*  AST 59*  ALT 28  ALKPHOS 131*  BILITOT 1.5*   ------------------------------------------------------------------------------------------------------------------ estimated creatinine clearance is 38.9 mL/min (by C-G formula based on Cr of  1.58). ------------------------------------------------------------------------------------------------------------------ No results for input(s): TSH, T4TOTAL,  T3FREE, THYROIDAB in the last 72 hours.  Invalid input(s): FREET3  Cardiac Enzymes  Recent Labs Lab 01/25/16 0043  TROPONINI <0.03   ------------------------------------------------------------------------------------------------------------------ Invalid input(s): POCBNP ---------------------------------------------------------------------------------------------------------------  RADIOLOGY: Ct Abdomen Pelvis Wo Contrast  01/25/2016  CLINICAL DATA:  Cough for 4 days, fever for 1 day, abdominal pain with nausea, vomiting and diarrhea for 3 days. Pre syncopal episode tonight, hypoglycemia. History of diabetes, chronic kidney disease, hypertension, bowel obstruction. EXAM: CT ABDOMEN AND PELVIS WITHOUT CONTRAST TECHNIQUE: Multidetector CT imaging of the abdomen and pelvis was performed following the standard protocol without IV contrast. Iodine allergy. COMPARISON:  CT abdomen and pelvis July 22, 2015 FINDINGS: Large body habitus results in overall noisy image quality. LUNG BASES: Tree-in-bud airspace opacities RIGHT middle lobe and RIGHT lower lobe lobe, patchy bilateral lower lobe airspace opacities. Heart size is upper limits of normal. No pericardial fusion. Cardiac pacer wires in place. KIDNEYS/BLADDER: Kidneys are orthotopic, demonstrating normal size and morphology. No nephrolithiasis, hydronephrosis; limited assessment for renal masses on this nonenhanced examination. The unopacified ureters are normal in course and caliber. Urinary bladder is well distended and unremarkable. SOLID ORGANS: The liver demonstrates calcified granuloma, otherwise unremarkable. Spleen, and adrenal glands are unremarkable for this non-contrast examination. Status post cholecystectomy. Atrophic pancreas. GASTROINTESTINAL TRACT: The stomach, small and  large bowel are normal in course and caliber without inflammatory changes, the sensitivity may be decreased by lack of enteric contrast. Rectosigmoid bowel anastomosis. Surgical clips in the pelvis. Status post apparent appendectomy. PERITONEUM/RETROPERITONEUM: Aortoiliac vessels are normal in course and caliber, moderate calcific atherosclerosis. No lymphadenopathy by CT size criteria. Status post hysterectomy. No intraperitoneal free fluid nor free air. SOFT TISSUES/ OSSEOUS STRUCTURES: Nonsuspicious. L4 through S1 PLIF. Mild periumbilical subcutaneous skin thickening is similar to prior CT. IMPRESSION: No acute intra-abdominal or pelvic process.  No bowel obstruction. RIGHT lung base tree-in-bud infiltrates concerning for infectious process with lower lobe consolidation/pneumonia. Electronically Signed   By: Elon Alas M.D.   On: 01/25/2016 05:20   Dg Chest Port 1 View  01/25/2016  CLINICAL DATA:  75 year old female with sepsis EXAM: PORTABLE CHEST 1 VIEW COMPARISON:  Chest radiograph dated 07/21/2015 FINDINGS: Single portable view of the chest does not demonstrate a focal consolidation. There is no pleural effusion or pneumothorax. Stable mild cardiomegaly. Left pectoral pacemaker device. Lower cervical fixation plate and screw. No acute fracture. IMPRESSION: No active disease. Electronically Signed   By: Anner Crete M.D.   On: 01/25/2016 01:20    EKG:  Orders placed or performed during the hospital encounter of 01/25/16  . EKG 12-Lead  . EKG 12-Lead    ASSESSMENT AND PLAN:  Principal Problem:   Community acquired pneumonia Active Problems:   Pneumonia #1. Sepsis due to right lower lobe pneumonia. Admit patient medical floor, continue IV fluids, blood cultures are taken, awaiting for sputum cultures to be taken. Continue patient on broad-spectrum antibiotic therapy. Get MRSA PCR, discontinue vancomycin if MRSA PCR is negative. Adjust antibiotics depending on culture results #2.  Right lower lobe pneumonia, possible from antibiotics intravenously, get blood cultures, sputum cultures and adjust antibiotic as depending on culture results, add Humibid, Tussionex, DuoNeb's. #3. Hypotension, continue IV fluids, advance them as needed. Hold metoprolol, Lasix. #4 diabetes mellitus type 2, insulin-dependent, continue insulin, Levemir, sliding scale insulin, following oral intake #5. Diarrhea, get stool cultures if possible and adjust medications depending on culture results #6. Nausea and vomiting, initiate patient on Protonix, sucralfate, Compazine, IV fluids, follow clinically    Management plans  discussed with the patient, family and they are in agreement.   DRUG ALLERGIES:  Allergies  Allergen Reactions  . Cephalosporins Other (See Comments)    Reaction:  Unknown   . Hydrocodone-Acetaminophen Hives  . Macrolides And Ketolides Other (See Comments)    Reaction:  Unknown   . Meperidine Shortness Of Breath, Nausea Only and Other (See Comments)    Reaction:  Stomach pain   . Shellfish Allergy Shortness Of Breath, Diarrhea, Nausea Only, Rash and Other (See Comments)    Reaction:  Stomach pain   . Sulfa Antibiotics Shortness Of Breath, Diarrhea, Nausea Only and Other (See Comments)    Reaction:  Stomach pain   . Aspirin Other (See Comments)    Reaction:  Stomach pain   . Celecoxib Other (See Comments)    Reaction:  GI bleed, weakness, and stomach pain.    . Cephalexin Diarrhea, Nausea Only and Other (See Comments)    Reaction:  Stomach pain   . Erythromycin Diarrhea, Nausea Only and Other (See Comments)    Reaction:  Stomach pain   . Hydromorphone Other (See Comments)    Reaction:  Hypotension   . Iodinated Diagnostic Agents Rash and Other (See Comments)    Pt states that she is unable to have because she has chronic kidney disease.    Marland Kitchen Oxycodone Other (See Comments)    Reaction:  Stomach pain   . Atorvastatin Nausea Only and Other (See Comments)    Reaction:   Weakness   . Codeine Diarrhea, Nausea Only and Other (See Comments)    Reaction:  Stomach pain   . Prednisone Other (See Comments)    Reaction:  Increases pts blood sugar  Pt states that she is allergic to all steroids.    . Tape Other (See Comments)    Reaction:  Causes pts skin to tear  Pt states that she is able to use paper tape.    . Valdecoxib Swelling and Other (See Comments)    Pt states that her hands and feet swell.    . Iodine Rash    CODE STATUS:     Code Status Orders        Start     Ordered   01/25/16 0621  Full code   Continuous     01/25/16 0622    Code Status History    Date Active Date Inactive Code Status Order ID Comments User Context   07/22/2015  1:07 AM 07/25/2015  4:09 PM Full Code [id number]  Harrie Foreman, MD Inpatient   07/01/2015  4:05 PM 07/03/2015  4:59 PM Full Code [id number]  Isaias Cowman, MD Inpatient   06/29/2015  5:51 PM 07/01/2015  4:05 PM Full Code [id number]  Fritzi Mandes, MD Inpatient   02/20/2015 11:22 PM 02/25/2015  5:53 PM Full Code [id number]  Marlyce Huge, MD ED      TOTAL Critical care TIME TAKING CARE OF THIS PATIENT: 40 minutes.    Theodoro Grist M.D on 01/25/2016 at 10:48 AM  Between 7am to 6pm - Pager - [phone]  After 6pm go to [email or web address] - password [email or web address] [facility]  Office  [phone]  CC: Primary care physician; [name]., MD

## 2016-01-25 NOTE — Progress Notes (Signed)
   01/25/16 1137  PT Visit Information  Last PT Received On 01/25/16  Reason Eval/Treat Not Completed Pain limiting ability to participate

## 2016-01-25 NOTE — ED Notes (Signed)
At this time, the floor nurse was called, but she was unable to com to the phone.  Ascom number was given and nurse to call back .

## 2016-01-25 NOTE — ED Notes (Signed)
Per MD, NS stopped and one order dc'd d/t pt's chronic kidney disease.

## 2016-01-25 NOTE — Progress Notes (Signed)
Pharmacy Antibiotic Note  Deborah Obrien is a 75 y.o. female admitted on 01/25/2016 with sepsis.  Pharmacy has been consulted for aztreonam, levofloxacin, and vancomycin dosing.  Plan: 1. Aztreonam 1 gm IV Q8H 2. Levofloxacin 750 mg IV Q48H 3. Vancomycin 1 gm IV x 1 in ED followed in approximately 6 hours (stacked dosing) by vancomycin 1.25 gm IV Q24H, predicted trough 17 mcg/mL. Pharmacy will continue to follow and adjust as needed to maintain trough 15 to 20 mcg/mL.   Vd 55.8 L, Ke 0.037 hr-1, T1/2 19 hr  Height: 5\' 4"  (162.6 cm) Weight: 260 lb (117.935 kg) IBW/kg (Calculated) : 54.7  Temp (24hrs), Avg:101 F (38.3 C), Min:101 F (38.3 C), Max:101 F (38.3 C)   Recent Labs Lab 01/25/16 0043  WBC 5.2  CREATININE 1.58*  LATICACIDVEN 1.3    Estimated Creatinine Clearance: 38.9 mL/min (by C-G formula based on Cr of 1.58).    Allergies  Allergen Reactions  . Cephalosporins Other (See Comments)    Reaction:  Unknown   . Hydrocodone-Acetaminophen Hives  . Macrolides And Ketolides Other (See Comments)    Reaction:  Unknown   . Meperidine Shortness Of Breath, Nausea Only and Other (See Comments)    Reaction:  Stomach pain   . Shellfish Allergy Shortness Of Breath, Diarrhea, Nausea Only, Rash and Other (See Comments)    Reaction:  Stomach pain   . Sulfa Antibiotics Shortness Of Breath, Diarrhea, Nausea Only and Other (See Comments)    Reaction:  Stomach pain   . Aspirin Other (See Comments)    Reaction:  Stomach pain   . Celecoxib Other (See Comments)    Reaction:  GI bleed, weakness, and stomach pain.    . Cephalexin Diarrhea, Nausea Only and Other (See Comments)    Reaction:  Stomach pain   . Erythromycin Diarrhea, Nausea Only and Other (See Comments)    Reaction:  Stomach pain   . Hydromorphone Other (See Comments)    Reaction:  Hypotension   . Iodinated Diagnostic Agents Rash and Other (See Comments)    Pt states that she is unable to have because she has chronic  kidney disease.    Marland Kitchen Oxycodone Other (See Comments)    Reaction:  Stomach pain   . Atorvastatin Nausea Only and Other (See Comments)    Reaction:  Weakness   . Codeine Diarrhea, Nausea Only and Other (See Comments)    Reaction:  Stomach pain   . Prednisone Other (See Comments)    Reaction:  Increases pts blood sugar  Pt states that she is allergic to all steroids.    . Tape Other (See Comments)    Reaction:  Causes pts skin to tear  Pt states that she is able to use paper tape.    . Valdecoxib Swelling and Other (See Comments)    Pt states that her hands and feet swell.    . Iodine Rash   Thank you for allowing pharmacy to be a part of this patient's care.  Laural Benes, Pharm.D., BCPS Clinical Pharmacist 01/25/2016 1:54 AM

## 2016-01-26 ENCOUNTER — Inpatient Hospital Stay: Payer: Medicare Other

## 2016-01-26 LAB — CBC
HCT: 36.5 % (ref 35.0–47.0)
HEMOGLOBIN: 11.5 g/dL — AB (ref 12.0–16.0)
MCH: 26.2 pg (ref 26.0–34.0)
MCHC: 31.6 g/dL — ABNORMAL LOW (ref 32.0–36.0)
MCV: 82.9 fL (ref 80.0–100.0)
PLATELETS: 130 10*3/uL — AB (ref 150–440)
RBC: 4.4 MIL/uL (ref 3.80–5.20)
RDW: 17 % — ABNORMAL HIGH (ref 11.5–14.5)
WBC: 6 10*3/uL (ref 3.6–11.0)

## 2016-01-26 LAB — GLUCOSE, CAPILLARY
GLUCOSE-CAPILLARY: 124 mg/dL — AB (ref 65–99)
GLUCOSE-CAPILLARY: 140 mg/dL — AB (ref 65–99)
GLUCOSE-CAPILLARY: 137 mg/dL — AB (ref 65–99)
GLUCOSE-CAPILLARY: 130 mg/dL — AB (ref 65–99)
GLUCOSE-CAPILLARY: 121 mg/dL — AB (ref 65–99)

## 2016-01-26 LAB — URINE CULTURE: Culture: 1000 — AB

## 2016-01-26 LAB — BASIC METABOLIC PANEL
Anion gap: 9 (ref 5–15)
BUN: 35 mg/dL — ABNORMAL HIGH (ref 6–20)
CALCIUM: 8.3 mg/dL — AB (ref 8.9–10.3)
CHLORIDE: 103 mmol/L (ref 101–111)
CO2: 22 mmol/L (ref 22–32)
CREATININE: 1.97 mg/dL — AB (ref 0.44–1.00)
GFR calc non Af Amer: 24 mL/min — ABNORMAL LOW (ref >60)
GFR, EST AFRICAN AMERICAN: 27 mL/min — AB (ref >60)
Glucose, Bld: 115 mg/dL — ABNORMAL HIGH (ref 65–99)
Potassium: 4.5 mmol/L (ref 3.5–5.1)
SODIUM: 134 mmol/L — AB (ref 135–145)

## 2016-01-26 MED ORDER — SODIUM CHLORIDE 0.9 % IV BOLUS (SEPSIS)
1000.0000 mL | Freq: Once | INTRAVENOUS | Status: AC
  Administered 2016-01-26: 1000 mL via INTRAVENOUS

## 2016-01-26 MED ORDER — POTASSIUM CHLORIDE IN NACL 20-0.9 MEQ/L-% IV SOLN
INTRAVENOUS | Status: DC
  Administered 2016-01-26 – 2016-01-27 (×2): via INTRAVENOUS
  Filled 2016-01-26 (×5): qty 1000

## 2016-01-26 MED ORDER — VANCOMYCIN HCL IN DEXTROSE 1-5 GM/200ML-% IV SOLN
1000.0000 mg | INTRAVENOUS | Status: DC
  Filled 2016-01-26: qty 200

## 2016-01-26 MED ORDER — BUDESONIDE 0.25 MG/2ML IN SUSP
0.2500 mg | Freq: Two times a day (BID) | RESPIRATORY_TRACT | Status: DC
  Administered 2016-01-27 – 2016-01-28 (×3): 0.25 mg via RESPIRATORY_TRACT
  Filled 2016-01-26 (×4): qty 2

## 2016-01-26 NOTE — Progress Notes (Signed)
Dr. Ether Griffins aware of pt having minimal urine output. Bolus of fluids ordered and given.

## 2016-01-26 NOTE — Progress Notes (Signed)
[facility] [place] NAME: Deborah Obrien    MR#:  [id number]  DATE OF BIRTH:  [birth year]  SUBJECTIVE:  CHIEF COMPLAINT:   Chief Complaint  Patient presents with  . Fever  . Cough  . Abdominal Pain  . Emesis  . Diarrhea   Patient is a 75 year old Caucasian female with past medical history significant for history of diabetes, insulin-dependent dependent. Neck supple. Hypertension, Morbid obesity,  CKD stage 3, paroxysmal atrial fibrillation who presents to the hospital with complaints of fever, cough, abdominal pain, emesis and diarrhea. Patient admitted of her body aches and weakness, yellow phlegm production. In emergency room, she was noted to be hypotensive and was initiated on IV fluids on sepsis protocol. She had high fevers to about 101, broad-spectrum antibiotics were initiated after blood cultures are taken. CT scan of abdomen revealed right lower lobe pneumonia. Patient is admitted for pneumonia therapy Today, patient feels satisfactory, better than twice yesterday, still has some cough, not able to lift much phlegm.  Review of Systems  Constitutional: Negative for fever, chills and weight loss.  HENT: Negative for congestion.   Eyes: Negative for blurred vision and double vision.  Respiratory: Positive for cough, sputum production, shortness of breath and wheezing.   Cardiovascular: Positive for chest pain. Negative for palpitations, orthopnea, leg swelling and PND.  Gastrointestinal: Positive for abdominal pain. Negative for nausea, vomiting, diarrhea, constipation and blood in stool.  Genitourinary: Negative for dysuria, urgency, frequency and hematuria.  Musculoskeletal: Negative for falls.  Neurological: Negative for dizziness, tremors, focal weakness and headaches.  Endo/Heme/Allergies: Does not bruise/bleed easily.  Psychiatric/Behavioral: Negative for depression. The patient does not have insomnia.     VITAL SIGNS:  Blood pressure 102/48, pulse 60, temperature 99.6 F (37.6 C), temperature source Oral, resp. rate 20, height 5\' 4"  (1.626 m), weight 117.935 kg (260 lb), SpO2 96 %.  PHYSICAL EXAMINATION:   GENERAL:  75 y.o.-year-old patient lying in the bed , moderate distress due to cough, discomfort in left chest, especially with cough.  EYES: Pupils equal, round, reactive to light and accommodation. No scleral icterus. Extraocular muscles intact.  HEENT: Head atraumatic, normocephalic. Oropharynx and nasopharynx clear.  NECK:  Supple, no jugular venous distention. No thyroid enlargement, no tenderness.  LUNGS: Decreased breath sounds bilaterally, intermittent wheezing at bases, inspiratory / expiratory, bilateral basal rales, rhonchi and crepitations at both bases, more on the right side. Intermittent use of accessory muscles of respiration, especially with long sentences.  CARDIOVASCULAR: S1, S2 normal. No murmurs, rubs, or gallops.  ABDOMEN: Soft, nontender, nondistended. Bowel sounds present. No organomegaly or mass.  EXTREMITIES: Trace pedal edema, no cyanosis, or clubbing.  NEUROLOGIC: Cranial nerves II through XII are intact. Muscle strength 5/5 in all extremities. Sensation intact. Gait not checked.  PSYCHIATRIC: The patient is alert and oriented x 3. Overall, patient is more animated, comfortable SKIN: No obvious rash, lesion, or ulcer.   ORDERS/RESULTS REVIEWED:   CBC  Recent Labs Lab 01/25/16 0043 01/25/16 1049 01/26/16 0547  WBC 5.2 5.1 6.0  HGB 10.3* 10.0* 11.5*  HCT 32.1* 31.7* 36.5  PLT 170 126* 130*  MCV 81.5 82.1 82.9  MCH 26.2 26.0 26.2  MCHC 32.1 31.7* 31.6*  RDW 16.9* 16.9* 17.0*  LYMPHSABS 0.8* 1.0  --   MONOABS 0.5 0.5  --   EOSABS 0.0 0.0  --   BASOSABS 0.0 0.0  --    ------------------------------------------------------------------------------------------------------------------  Chemistries   Recent Labs Lab  01/25/16 0043 01/25/16 1049 01/26/16 0547  NA  135 135 134*  K 4.0 3.9 4.5  CL 101 105 103  CO2 25 23 22   GLUCOSE 204* 134* 115*  BUN 33* 30* 35*  CREATININE 1.58* 1.49* 1.97*  CALCIUM 8.3* 7.8* 8.3*  AST 59* 51*  --   ALT 28 24  --   ALKPHOS 131* 118  --   BILITOT 1.5* 1.4*  --    ------------------------------------------------------------------------------------------------------------------ estimated creatinine clearance is 31.2 mL/min (by C-G formula based on Cr of 1.97). ------------------------------------------------------------------------------------------------------------------ No results for input(s): TSH, T4TOTAL, T3FREE, THYROIDAB in the last 72 hours.  Invalid input(s): FREET3  Cardiac Enzymes  Recent Labs Lab 01/25/16 0043  TROPONINI <0.03   ------------------------------------------------------------------------------------------------------------------ Invalid input(s): POCBNP ---------------------------------------------------------------------------------------------------------------  RADIOLOGY: Ct Abdomen Pelvis Wo Contrast  01/25/2016  CLINICAL DATA:  Cough for 4 days, fever for 1 day, abdominal pain with nausea, vomiting and diarrhea for 3 days. Pre syncopal episode tonight, hypoglycemia. History of diabetes, chronic kidney disease, hypertension, bowel obstruction. EXAM: CT ABDOMEN AND PELVIS WITHOUT CONTRAST TECHNIQUE: Multidetector CT imaging of the abdomen and pelvis was performed following the standard protocol without IV contrast. Iodine allergy. COMPARISON:  CT abdomen and pelvis July 22, 2015 FINDINGS: Large body habitus results in overall noisy image quality. LUNG BASES: Tree-in-bud airspace opacities RIGHT middle lobe and RIGHT lower lobe lobe, patchy bilateral lower lobe airspace opacities. Heart size is upper limits of normal. No pericardial fusion. Cardiac pacer wires in place. KIDNEYS/BLADDER: Kidneys are orthotopic, demonstrating normal size and morphology. No nephrolithiasis,  hydronephrosis; limited assessment for renal masses on this nonenhanced examination. The unopacified ureters are normal in course and caliber. Urinary bladder is well distended and unremarkable. SOLID ORGANS: The liver demonstrates calcified granuloma, otherwise unremarkable. Spleen, and adrenal glands are unremarkable for this non-contrast examination. Status post cholecystectomy. Atrophic pancreas. GASTROINTESTINAL TRACT: The stomach, small and large bowel are normal in course and caliber without inflammatory changes, the sensitivity may be decreased by lack of enteric contrast. Rectosigmoid bowel anastomosis. Surgical clips in the pelvis. Status post apparent appendectomy. PERITONEUM/RETROPERITONEUM: Aortoiliac vessels are normal in course and caliber, moderate calcific atherosclerosis. No lymphadenopathy by CT size criteria. Status post hysterectomy. No intraperitoneal free fluid nor free air. SOFT TISSUES/ OSSEOUS STRUCTURES: Nonsuspicious. L4 through S1 PLIF. Mild periumbilical subcutaneous skin thickening is similar to prior CT. IMPRESSION: No acute intra-abdominal or pelvic process.  No bowel obstruction. RIGHT lung base tree-in-bud infiltrates concerning for infectious process with lower lobe consolidation/pneumonia. Electronically Signed   By: Elon Alas M.D.   On: 01/25/2016 05:20   US Renal  01/26/2016  CLINICAL DATA:  75 year old female with acute on chronic renal failure. EXAM: RENAL / URINARY TRACT ULTRASOUND COMPLETE COMPARISON:  01/25/2016 and prior studies.  04/26/2010 ultrasound FINDINGS: Right Kidney: Length: 9.5 cm. Echogenicity within normal limits. Mild cortical thinning noted. No focal mass or hydronephrosis. Left Kidney: Length: 10.1 cm. Echogenicity within normal limits. Mild cortical thinning noted. No mass or hydronephrosis visualized. No significant change since 2011. Bladder: Appears normal for degree of bladder distention. IMPRESSION: Mild bilateral renal cortical thinning.  No evidence of hydronephrosis. Electronically Signed   By: Margarette Canada M.D.   On: 01/26/2016 11:00   Dg Chest Port 1 View  01/25/2016  CLINICAL DATA:  75 year old female with sepsis EXAM: PORTABLE CHEST 1 VIEW COMPARISON:  Chest radiograph dated 07/21/2015 FINDINGS: Single portable view of the chest does not demonstrate a focal consolidation. There is no pleural effusion or pneumothorax. Stable mild cardiomegaly.  Left pectoral pacemaker device. Lower cervical fixation plate and screw. No acute fracture. IMPRESSION: No active disease. Electronically Signed   By: Anner Crete M.D.   On: 01/25/2016 01:20    EKG:  Orders placed or performed during the hospital encounter of 01/25/16  . EKG 12-Lead  . EKG 12-Lead    ASSESSMENT AND PLAN:  Principal Problem:   Community acquired pneumonia Active Problems:   Pneumonia #1. Sepsis due to right lower lobe pneumonia. Blood cultures are negative so far, urinary cultures revealed 1000 colony-forming units, insignificant growth, MRSA screening is negative, sputum culture is  not received, advance IV fluids as patient is hypotensive, . Continue patient on Levaquin. MRSA PCR was negative now patient is off  vancomycin . Repeat chest x-ray Patient's fevers have subsided .  #2. Right lower lobe pneumonia,  blood cultures are negative so far, sputum cultures eye pending, not received yet, continue levofloxacin intravenously  , adjust antibiotic as depending on culture results, . Continue Humibid, Tussionex, DuoNeb's. #3. Hypotension, advance IV fluids. Holding metoprolol, Lasix. Repeating chest x-ray as patient has crackles at bases, concerning for CHF exacerbation #4 diabetes mellitus type 2, insulin-dependent, continue insulin, Levemir, sliding scale insulin, following oral intake, blood glucose ranges between 122 - 240 #5. Diarrhea,  no further diarrhea was noted during  this admission, get stool cultures as neededand adjust medications depending on culture  results #6. Nausea and vomiting, continue Protonix, sucralfate, Compazine, IV fluids, follow clinically. Patient ate only 25% of offered meals today    Management plans discussed with the patient, family and they are in agreement.   DRUG ALLERGIES:  Allergies  Allergen Reactions  . Cephalosporins Other (See Comments)    Reaction:  Unknown   . Hydrocodone-Acetaminophen Hives  . Macrolides And Ketolides Other (See Comments)    Reaction:  Unknown   . Meperidine Shortness Of Breath, Nausea Only and Other (See Comments)    Reaction:  Stomach pain   . Shellfish Allergy Shortness Of Breath, Diarrhea, Nausea Only, Rash and Other (See Comments)    Reaction:  Stomach pain   . Sulfa Antibiotics Shortness Of Breath, Diarrhea, Nausea Only and Other (See Comments)    Reaction:  Stomach pain   . Aspirin Other (See Comments)    Reaction:  Stomach pain   . Celecoxib Other (See Comments)    Reaction:  GI bleed, weakness, and stomach pain.    . Cephalexin Diarrhea, Nausea Only and Other (See Comments)    Reaction:  Stomach pain   . Erythromycin Diarrhea, Nausea Only and Other (See Comments)    Reaction:  Stomach pain   . Hydromorphone Other (See Comments)    Reaction:  Hypotension   . Iodinated Diagnostic Agents Rash and Other (See Comments)    Pt states that she is unable to have because she has chronic kidney disease.    Marland Kitchen Oxycodone Other (See Comments)    Reaction:  Stomach pain   . Atorvastatin Nausea Only and Other (See Comments)    Reaction:  Weakness   . Codeine Diarrhea, Nausea Only and Other (See Comments)    Reaction:  Stomach pain   . Prednisone Other (See Comments)    Reaction:  Increases pts blood sugar  Pt states that she is allergic to all steroids.    . Tape Other (See Comments)    Reaction:  Causes pts skin to tear  Pt states that she is able to use paper tape.    . Valdecoxib Swelling  and Other (See Comments)    Pt states that her hands and feet swell.    . Iodine Rash     CODE STATUS:     Code Status Orders        Start     Ordered   01/25/16 0621  Full code   Continuous     01/25/16 0622    Code Status History    Date Active Date Inactive Code Status Order ID Comments User Context   07/22/2015  1:07 AM 07/25/2015  4:09 PM Full Code [id number]  Harrie Foreman, MD Inpatient   07/01/2015  4:05 PM 07/03/2015  4:59 PM Full Code [id number]  Isaias Cowman, MD Inpatient   06/29/2015  5:51 PM 07/01/2015  4:05 PM Full Code [id number]  Fritzi Mandes, MD Inpatient   02/20/2015 11:22 PM 02/25/2015  5:53 PM Full Code [id number]  Marlyce Huge, MD ED      TOTALTIME TAKING CARE OF THIS PATIENT: 40 minutes.    Theodoro Grist M.D on 01/26/2016 at 12:52 PM  Between 7am to 6pm - Pager - [phone]  After 6pm go to [email or web address] - password [email or web address] [facility]  Office  [phone]  CC: Primary care physician; [name]., MD

## 2016-01-26 NOTE — Care Management (Signed)
Patient admitted from home with sepsis.  Patient states that she lives at home with her husband, who does most of the driving.  Patient obtains her medications from [facility] on [address] on [address].  Patient has rollator, BSC, shower chair, cane and WC in the home.  Patient has been assessed by PT and recommends home health PT.  Patient states that she has bene open with [facility] in the past and would like to use them again if indicated at time of discharge.  Tim with [name] given heads up referral. RNCM following

## 2016-01-26 NOTE — Progress Notes (Signed)
Pharmacy Antibiotic Note- Day 2  Deborah Obrien is a 75 y.o. female admitted on 01/25/2016 with sepsis.  Pharmacy has been consulted for aztreonam, levofloxacin, and vancomycin dosing.  Plan: 1. Continue Levofloxacin 750 mg IV Q48H based on renal function.  2. Will transition patient to Vancomycin 1 gm IV q24h based on decline in renal function and as patient is at an increased of accumulation based on BMI.  Pharmacy will continue to follow and adjust as needed to maintain trough 15 to 20 mcg/mL. Trough ordered for 5/18 at 1200.   Vd 56 L based on adjusted body weight of 80 kg, Ke 0.030 hr-1, T1/2 23 hr  Height: 5\' 4"  (162.6 cm) Weight: 260 lb (117.935 kg) IBW/kg (Calculated) : 54.7  Temp (24hrs), Avg:99.6 F (37.6 C), Min:99.2 F (37.3 C), Max:100 F (37.8 C)   Recent Labs Lab 01/25/16 0043 01/25/16 1049 01/25/16 1435 01/26/16 0547  WBC 5.2 5.1  --  6.0  CREATININE 1.58* 1.49*  --  1.97*  LATICACIDVEN 1.3 1.8 1.7  --     Estimated Creatinine Clearance: 31.2 mL/min (by C-G formula based on Cr of 1.97).    Allergies  Allergen Reactions  . Cephalosporins Other (See Comments)    Reaction:  Unknown   . Hydrocodone-Acetaminophen Hives  . Macrolides And Ketolides Other (See Comments)    Reaction:  Unknown   . Meperidine Shortness Of Breath, Nausea Only and Other (See Comments)    Reaction:  Stomach pain   . Shellfish Allergy Shortness Of Breath, Diarrhea, Nausea Only, Rash and Other (See Comments)    Reaction:  Stomach pain   . Sulfa Antibiotics Shortness Of Breath, Diarrhea, Nausea Only and Other (See Comments)    Reaction:  Stomach pain   . Aspirin Other (See Comments)    Reaction:  Stomach pain   . Celecoxib Other (See Comments)    Reaction:  GI bleed, weakness, and stomach pain.    . Cephalexin Diarrhea, Nausea Only and Other (See Comments)    Reaction:  Stomach pain   . Erythromycin Diarrhea, Nausea Only and Other (See Comments)    Reaction:  Stomach pain   .  Hydromorphone Other (See Comments)    Reaction:  Hypotension   . Iodinated Diagnostic Agents Rash and Other (See Comments)    Pt states that she is unable to have because she has chronic kidney disease.    Marland Kitchen Oxycodone Other (See Comments)    Reaction:  Stomach pain   . Atorvastatin Nausea Only and Other (See Comments)    Reaction:  Weakness   . Codeine Diarrhea, Nausea Only and Other (See Comments)    Reaction:  Stomach pain   . Prednisone Other (See Comments)    Reaction:  Increases pts blood sugar  Pt states that she is allergic to all steroids.    . Tape Other (See Comments)    Reaction:  Causes pts skin to tear  Pt states that she is able to use paper tape.    . Valdecoxib Swelling and Other (See Comments)    Pt states that her hands and feet swell.    . Iodine Rash   Thank you for allowing pharmacy to be a part of this patient's care.  Cade Olberding G, Pharm.D., BCPS Clinical Pharmacist 01/26/2016 8:00 AM

## 2016-01-26 NOTE — Evaluation (Signed)
Physical Therapy Evaluation Patient Details Name: Deborah Obrien MRN: [id number] DOB: Sep 21, 1940 Today's Date: 01/26/2016   History of Present Illness  Pt admitted for community acquired pneumonia. Pt with complaints of fever, cough, and abdominal pain x 1 day. 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 Pt with history of CKD, DM, and OA.   Clinical Impression  Pt is a pleasant 75 year old female who was admitted for CAP. Pt performs transfers with min assist using SPC and ambulation with min assist with SPC. Further ambulation performed with RW with improvement in balance/mobility and only requiring CGA. Pt educated to continue use of RW at discharge for safety while in home environment as pt admits to furniture ambulation in home. Pt demonstrates deficits with strength/endurance/mobility. Would benefit from skilled PT to address above deficits and promote optimal return to PLOF. Recommend transition to [facility] upon discharge from acute hospitalization.       Follow Up Recommendations Home health PT    Equipment Recommendations       Recommendations for Other Services       Precautions / Restrictions Precautions Precautions: Fall Restrictions Weight Bearing Restrictions: No      Mobility  Bed Mobility               General bed mobility comments: not performed as pt received sitting at EOB  Transfers Overall transfer level: Needs assistance Equipment used: Straight cane Transfers: Sit to/from Stand Sit to Stand: Min assist         General transfer comment: Safe technique performed, however pt with slight unsteadiness with static standing. Further transfers performed with rw  Ambulation/Gait Ambulation/Gait assistance: Min assist Ambulation Distance (Feet): 8 Feet Assistive device: Straight cane Gait Pattern/deviations: Step-to pattern     General Gait Details: ambulated with SPC with min assist. Pt with unsteadiness with increased lateral sway. Further  ambulation performed with rw.  Stairs            Wheelchair Mobility    Modified Rankin (Stroke Patients Only)       Balance Overall balance assessment: Needs assistance Sitting-balance support: Feet supported Sitting balance-Leahy Scale: Normal     Standing balance support: Bilateral upper extremity supported Standing balance-Leahy Scale: Fair                               Pertinent Vitals/Pain Pain Assessment: No/denies pain    Home Living Family/patient expects to be discharged to:: Private residence Living Arrangements: Spouse/significant other Available Help at Discharge: Family Type of Home: House Home Access: Ramped entrance     Home Layout: One level Home Equipment: Environmental consultant - 4 wheels;Cane - single point;Bedside commode;Tub bench      Prior Function Level of Independence: Independent with assistive device(s)         Comments: uses SPC in home and rollater in community     Hand Dominance        Extremity/Trunk Assessment   Upper Extremity Assessment: Overall WFL for tasks assessed           Lower Extremity Assessment: Generalized weakness (B LE grossly 3+/5)         Communication   Communication: No difficulties  Cognition Arousal/Alertness: Awake/alert Behavior During Therapy: WFL for tasks assessed/performed Overall Cognitive Status: Within Functional Limits for tasks assessed                      General  Comments      Exercises Other Exercises Other Exercises: Pt performed further ambulation x 30' with rw and cga. Pt fatigues quickly with ambulation, requesting to return to recliner. Reciprocal gait pattern performed with safe technique and no LOB noted. Other Exercises: Seated ther-ex performed including B LE LAQ, ankle pumps, hip abd/add, and SAQ. All ther-ex performed x 10 reps with cga and cues for correct technique.      Assessment/Plan    PT Assessment Patient needs continued PT services  PT  Diagnosis Difficulty walking;Generalized weakness   PT Problem List Decreased strength;Decreased balance;Decreased mobility  PT Treatment Interventions Gait training;Therapeutic exercise   PT Goals (Current goals can be found in the Care Plan section) Acute Rehab PT Goals Patient Stated Goal: to go home PT Goal Formulation: With patient Time For Goal Achievement: 02/09/16 Potential to Achieve Goals: Good    Frequency Min 2X/week   Barriers to discharge        Co-evaluation               End of Session Equipment Utilized During Treatment: Gait belt Activity Tolerance: Patient tolerated treatment well Patient left: in chair;with chair alarm set Nurse Communication: Mobility status         Time: [id number] PT Time Calculation (min) (ACUTE ONLY): 27 min   Charges:   PT Evaluation $PT Eval Moderate Complexity: 1 Procedure PT Treatments $Gait Training: 8-22 mins $Therapeutic Exercise: 8-22 mins   PT G Codes:        Jia Dottavio 2016/02/20, 11:44 AM  Greggory Stallion, PT, DPT [phone]

## 2016-01-27 ENCOUNTER — Inpatient Hospital Stay
Admit: 2016-01-27 | Discharge: 2016-01-27 | Disposition: A | Discharge status: Home / Self Care | Payer: Medicare Other | Attending: Internal Medicine | Admitting: Internal Medicine

## 2016-01-27 LAB — GLUCOSE, CAPILLARY
GLUCOSE-CAPILLARY: 76 mg/dL (ref 65–99)
GLUCOSE-CAPILLARY: 158 mg/dL — AB (ref 65–99)
GLUCOSE-CAPILLARY: 109 mg/dL — AB (ref 65–99)
Glucose-Capillary: 136 mg/dL — ABNORMAL HIGH (ref 65–99)

## 2016-01-27 LAB — BASIC METABOLIC PANEL
ANION GAP: 5 (ref 5–15)
BUN: 37 mg/dL — ABNORMAL HIGH (ref 6–20)
CALCIUM: 8.1 mg/dL — AB (ref 8.9–10.3)
CO2: 21 mmol/L — ABNORMAL LOW (ref 22–32)
Chloride: 109 mmol/L (ref 101–111)
Creatinine, Ser: 1.9 mg/dL — ABNORMAL HIGH (ref 0.44–1.00)
GFR, EST AFRICAN AMERICAN: 29 mL/min — AB (ref >60)
GFR, EST NON AFRICAN AMERICAN: 25 mL/min — AB (ref >60)
Glucose, Bld: 83 mg/dL (ref 65–99)
POTASSIUM: 4.4 mmol/L (ref 3.5–5.1)
SODIUM: 135 mmol/L (ref 135–145)

## 2016-01-27 LAB — CBC
HEMATOCRIT: 33.4 % — AB (ref 35.0–47.0)
HEMOGLOBIN: 10.5 g/dL — AB (ref 12.0–16.0)
MCH: 26.5 pg (ref 26.0–34.0)
MCHC: 31.3 g/dL — ABNORMAL LOW (ref 32.0–36.0)
MCV: 84.7 fL (ref 80.0–100.0)
Platelets: 127 10*3/uL — ABNORMAL LOW (ref 150–440)
RBC: 3.94 MIL/uL (ref 3.80–5.20)
RDW: 17.5 % — ABNORMAL HIGH (ref 11.5–14.5)
WBC: 6 10*3/uL (ref 3.6–11.0)

## 2016-01-27 LAB — ECHOCARDIOGRAM COMPLETE

## 2016-01-27 MED ORDER — BOOST PLUS PO LIQD
237.0000 mL | Freq: Three times a day (TID) | ORAL | Status: DC
  Administered 2016-01-27 – 2016-01-28 (×2): 237 mL via ORAL

## 2016-01-27 MED ORDER — FUROSEMIDE 40 MG PO TABS: 40.0000 mg | ORAL_TABLET | Freq: Every day | ORAL | Status: DC

## 2016-01-27 MED ORDER — FUROSEMIDE 10 MG/ML IJ SOLN
40.0000 mg | Freq: Once | INTRAMUSCULAR | Status: AC
  Administered 2016-01-27: 40 mg via INTRAVENOUS
  Filled 2016-01-27: qty 4

## 2016-01-27 MED ORDER — FUROSEMIDE 40 MG PO TABS
40.0000 mg | ORAL_TABLET | Freq: Every day | ORAL | Status: DC
  Administered 2016-01-28: 40 mg via ORAL
  Filled 2016-01-27: qty 1

## 2016-01-27 MED ORDER — LEVOFLOXACIN 750 MG PO TABS
750.0000 mg | ORAL_TABLET | ORAL | Status: DC
  Administered 2016-01-27: 750 mg via ORAL
  Filled 2016-01-27 (×2): qty 1

## 2016-01-27 NOTE — Progress Notes (Signed)
Physical Therapy Treatment Patient Details Name: Deborah Obrien MRN: [id number] DOB: 1941-08-22 Today's Date: 01/27/2016    History of Present Illness Pt admitted for community acquired pneumonia. Pt with complaints of fever, cough, and abdominal pain x 1 day. 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 Pt with history of CKD, DM, and OA.     PT Comments    Pt is making good progress towards goals, however fatigues quickly with exertion. Pt cued for ambulating short distances at home followed by rest breaks. Pt steady using rw, advised using that AD vs SPC at this time. Pt very motivated to return home.   Follow Up Recommendations  Home health PT     Equipment Recommendations       Recommendations for Other Services       Precautions / Restrictions Precautions Precautions: Fall Restrictions Weight Bearing Restrictions: No    Mobility  Bed Mobility               General bed mobility comments: not performed as pt received seated in recliner  Transfers Overall transfer level: Needs assistance Equipment used: Rolling walker (2 wheeled) Transfers: Sit to/from Stand Sit to Stand: Supervision         General transfer comment: safe technique performed with cues for pushing from seated surface. Once standing, pt able to demonstrate upright posture.  Ambulation/Gait Ambulation/Gait assistance: Min guard Ambulation Distance (Feet): 55 Feet Assistive device: Rolling walker (2 wheeled) Gait Pattern/deviations: Step-through pattern     General Gait Details: ambulated using rw with 2 standing rest breaks noted. Pt demonstrates reciprocal gait pattern. Vitals WNL with exertion.   Stairs            Wheelchair Mobility    Modified Rankin (Stroke Patients Only)       Balance                                    Cognition Arousal/Alertness: Awake/alert Behavior During Therapy: WFL for tasks assessed/performed Overall Cognitive Status:  Within Functional Limits for tasks assessed                      Exercises Other Exercises Other Exercises: Pt ambulated to bathroom, able to perform hygiene with cga. Safe technique performed. Pt fatigues quickly with exertion.    General Comments        Pertinent Vitals/Pain Pain Assessment: No/denies pain    Home Living                      Prior Function            PT Goals (current goals can now be found in the care plan section) Acute Rehab PT Goals Patient Stated Goal: to go home PT Goal Formulation: With patient Time For Goal Achievement: 02/09/16 Potential to Achieve Goals: Good Progress towards PT goals: Progressing toward goals    Frequency  Min 2X/week    PT Plan Current plan remains appropriate    Co-evaluation             End of Session Equipment Utilized During Treatment: Gait belt Activity Tolerance: Patient limited by fatigue Patient left: in chair;with chair alarm set     Time: 1540-1605 PT Time Calculation (min) (ACUTE ONLY): 25 min  Charges:  $Gait Training: 8-22 mins $Therapeutic Activity: 8-22 mins  G Codes:      Maty Zeisler 01/27/2016, 4:48 PM  Greggory Stallion, PT, DPT [phone]

## 2016-01-27 NOTE — Progress Notes (Signed)
[facility] NAME: Deborah Obrien    MR#:  [id number]  DATE OF BIRTH:  1941-06-11  SUBJECTIVE:  CHIEF COMPLAINT:   Chief Complaint  Patient presents with  . Fever  . Cough  . Abdominal Pain  . Emesis  . Diarrhea   Patient is a 75 year old Caucasian female with past medical history significant for history of diabetes, insulin-dependent dependent. Neck supple. Hypertension, Morbid obesity,  CKD stage 3, paroxysmal atrial fibrillation who presents to the hospital with complaints of fever, cough, abdominal pain, emesis and diarrhea. Patient admitted of her body aches and weakness, yellow phlegm production. In emergency room, she was noted to be hypotensive and was initiated on IV fluids on sepsis protocol. She had high fevers to about 101, broad-spectrum antibiotics were initiated after blood cultures are taken. CT scan of abdomen revealed right lower lobe pneumonia. Patient is admitted for pneumonia therapy Today, patient feels satisfactory, better than twice yesterday, still has some cough, not able to lift much phlegm.  Short of breath today, not able to lay down in bed, sits upright., Not able to produce sputum. Review of Systems  Constitutional: Negative for fever, chills and weight loss.  HENT: Negative for congestion.   Eyes: Negative for blurred vision and double vision.  Respiratory: Positive for cough, sputum production, shortness of breath and wheezing.   Cardiovascular: Positive for chest pain. Negative for palpitations, orthopnea, leg swelling and PND.  Gastrointestinal: Positive for abdominal pain. Negative for nausea, vomiting, diarrhea, constipation and blood in stool.  Genitourinary: Negative for dysuria, urgency, frequency and hematuria.  Musculoskeletal: Negative for falls.  Neurological: Negative for dizziness, tremors, focal weakness and headaches.  Endo/Heme/Allergies: Does not bruise/bleed easily.   Psychiatric/Behavioral: Negative for depression. The patient does not have insomnia.     VITAL SIGNS: Blood pressure 126/42, pulse 60, temperature 97.8 F (36.6 C), temperature source Oral, resp. rate 18, height 5\' 4"  (1.626 m), weight 117.935 kg (260 lb), SpO2 97 %.  PHYSICAL EXAMINATION:   GENERAL:  75 y.o.-year-old patient sitting in the recliner , moderate distress due to cough, dyspnea, especially on exertion, talking and coughing. EYES: Pupils equal, round, reactive to light and accommodation. No scleral icterus. Extraocular muscles intact.  HEENT: Head atraumatic, normocephalic. Oropharynx and nasopharynx clear.  NECK:  Supple, no jugular venous distention. No thyroid enlargement, no tenderness.  LUNGS: Decreased breath sounds bilaterally, intermittent wheezing at bases, inspiratory / expiratory, bilateral basal rales, rhonchi and crepitations in both lungs . Intermittent use of accessory muscles of respiration, especially with long sentences.  CARDIOVASCULAR: S1, S2 normal. No murmurs, rubs, or gallops.  ABDOMEN: Soft, nontender, nondistended. Bowel sounds present. No organomegaly or mass.  EXTREMITIES: Trace pedal edema, no cyanosis, or clubbing.  NEUROLOGIC: Cranial nerves II through XII are intact. Muscle strength 5/5 in all extremities. Sensation intact. Gait not checked.  PSYCHIATRIC: The patient is alert and oriented x 3. Uncomfortable, dyspneic SKIN: No obvious rash, lesion, or ulcer.   ORDERS/RESULTS REVIEWED:   CBC  Recent Labs Lab 01/25/16 0043 01/25/16 1049 01/26/16 0547 01/27/16 0500  WBC 5.2 5.1 6.0 6.0  HGB 10.3* 10.0* 11.5* 10.5*  HCT 32.1* 31.7* 36.5 33.4*  PLT 170 126* 130* 127*  MCV 81.5 82.1 82.9 84.7  MCH 26.2 26.0 26.2 26.5  MCHC 32.1 31.7* 31.6* 31.3*  RDW 16.9* 16.9* 17.0* 17.5*  LYMPHSABS 0.8* 1.0  --   --   MONOABS 0.5 0.5  --   --  EOSABS 0.0 0.0  --   --   BASOSABS 0.0 0.0  --   --     ------------------------------------------------------------------------------------------------------------------  Chemistries   Recent Labs Lab 01/25/16 0043 01/25/16 1049 01/26/16 0547 01/27/16 0500  NA 135 135 134* 135  K 4.0 3.9 4.5 4.4  CL 101 105 103 109  CO2 25 23 22  21*  GLUCOSE 204* 134* 115* 83  BUN 33* 30* 35* 37*  CREATININE 1.58* 1.49* 1.97* 1.90*  CALCIUM 8.3* 7.8* 8.3* 8.1*  AST 59* 51*  --   --   ALT 28 24  --   --   ALKPHOS 131* 118  --   --   BILITOT 1.5* 1.4*  --   --    ------------------------------------------------------------------------------------------------------------------ estimated creatinine clearance is 32.3 mL/min (by C-G formula based on Cr of 1.9). ------------------------------------------------------------------------------------------------------------------ No results for input(s): TSH, T4TOTAL, T3FREE, THYROIDAB in the last 72 hours.  Invalid input(s): FREET3  Cardiac Enzymes  Recent Labs Lab 01/25/16 0043  TROPONINI <0.03   ------------------------------------------------------------------------------------------------------------------ Invalid input(s): POCBNP ---------------------------------------------------------------------------------------------------------------  RADIOLOGY: US Renal  01/26/2016  CLINICAL DATA:  75 year old female with acute on chronic renal failure. EXAM: RENAL / URINARY TRACT ULTRASOUND COMPLETE COMPARISON:  01/25/2016 and prior studies.  04/26/2010 ultrasound FINDINGS: Right Kidney: Length: 9.5 cm. Echogenicity within normal limits. Mild cortical thinning noted. No focal mass or hydronephrosis. Left Kidney: Length: 10.1 cm. Echogenicity within normal limits. Mild cortical thinning noted. No mass or hydronephrosis visualized. No significant change since 2011. Bladder: Appears normal for degree of bladder distention. IMPRESSION: Mild bilateral renal cortical thinning. No evidence of hydronephrosis.  Electronically Signed   By: Margarette Canada M.D.   On: 01/26/2016 11:00   Dg Chest Port 1 View  01/26/2016  CLINICAL DATA:  Pt admitted for community acquired pneumonia. Pt with complaints of fever, cough, and abdominal pain x 1 day. 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 Pt with history of CKD, DM, and OA EXAM: CHEST  2 VIEW COMPARISON:  none FINDINGS: Persistent left retrocardiac consolidation/atelectasis with some air bronchograms evident. Patchy interstitial opacities at the right lung base. Mild cardiomegaly stable. Left subclavian pacemaker extends towards the right ventricular apex. Cervical fixation hardware noted. IMPRESSION: 1. Bibasilar airspace opacities left greater than right. 2. Mild cardiomegaly. Electronically Signed   By: Lucrezia Europe M.D.   On: 01/26/2016 13:33    EKG:  Orders placed or performed during the hospital encounter of 01/25/16  . EKG 12-Lead  . EKG 12-Lead    ASSESSMENT AND PLAN:  Principal Problem:   Community acquired pneumonia Active Problems:   Pneumonia #1. Sepsis due to right lower lobe pneumonia. Blood cultures are negative so far, urinary cultures revealed 1000 colony-forming units, insignificant growth, MRSA screening is negative, sputum culture is  pending, I'm concerned about underlying acute on chronic diastolic CHF, so we will discontinue IV fluids as patient is not hypotensive anymore. Continue patient on Levaquin. MRSA PCR was negative now patient is off  vancomycin . Repeated chest x-ray revealed a basilar airspace opacities, left greater than the right, Patient's fevers have subsided .  #2. Right and now also likely left lower lobe pneumonia,  blood cultures are negative so far, sputum cultures are pending, not received yet, continue levofloxacin intravenously  , adjust antibiotic as depending on culture results, . Continue Humibid, Tussionex, DuoNeb's. #3. Hypotension, resolved on IV fluids. Holding metoprolol, but resuming Lasix. Repeated chest  x-ray did not show pulmonary congestion/CHF, however, clinically she is in CHF exacerbation #  4 diabetes mellitus type 2, insulin-dependent, continue insulin, Levemir, sliding scale insulin, following oral intake, blood glucose ranges between 76- 158 #5. Diarrhea,  no further diarrhea was noted during  this admission, last bowel movement was 15th of May 2017, get stool cultures as needed and adjust medications depending on culture results #6. Nausea and vomiting, continue Protonix, sucralfate, Compazine, IV fluids, improved clinically. Patient ate only 50-100% of offered meals today #7. Acute on chronic diastolic CHF, getting cardiologist involved, and reinitiating patient on Lasix, following in's and outs and kidney function closely #8. Chronic renal insufficiency, CKD, stage IV, follow with diuresis    Management plans discussed with the patient, family and they are in agreement.   DRUG ALLERGIES:  Allergies  Allergen Reactions  . Cephalosporins Other (See Comments)    Reaction:  Unknown   . Hydrocodone-Acetaminophen Hives  . Macrolides And Ketolides Other (See Comments)    Reaction:  Unknown   . Meperidine Shortness Of Breath, Nausea Only and Other (See Comments)    Reaction:  Stomach pain   . Shellfish Allergy Shortness Of Breath, Diarrhea, Nausea Only, Rash and Other (See Comments)    Reaction:  Stomach pain   . Sulfa Antibiotics Shortness Of Breath, Diarrhea, Nausea Only and Other (See Comments)    Reaction:  Stomach pain   . Aspirin Other (See Comments)    Reaction:  Stomach pain   . Celecoxib Other (See Comments)    Reaction:  GI bleed, weakness, and stomach pain.    . Cephalexin Diarrhea, Nausea Only and Other (See Comments)    Reaction:  Stomach pain   . Erythromycin Diarrhea, Nausea Only and Other (See Comments)    Reaction:  Stomach pain   . Hydromorphone Other (See Comments)    Reaction:  Hypotension   . Iodinated Diagnostic Agents Rash and Other (See Comments)    Pt  states that she is unable to have because she has chronic kidney disease.    Marland Kitchen Oxycodone Other (See Comments)    Reaction:  Stomach pain   . Atorvastatin Nausea Only and Other (See Comments)    Reaction:  Weakness   . Codeine Diarrhea, Nausea Only and Other (See Comments)    Reaction:  Stomach pain   . Prednisone Other (See Comments)    Reaction:  Increases pts blood sugar  Pt states that she is allergic to all steroids.    . Tape Other (See Comments)    Reaction:  Causes pts skin to tear  Pt states that she is able to use paper tape.    . Valdecoxib Swelling and Other (See Comments)    Pt states that her hands and feet swell.    . Iodine Rash    CODE STATUS:     Code Status Orders        Start     Ordered   01/25/16 0621  Full code   Continuous     01/25/16 0622    Code Status History    Date Active Date Inactive Code Status Order ID Comments User Context   07/22/2015  1:07 AM 07/25/2015  4:09 PM Full Code [id number]  Harrie Foreman, MD Inpatient   07/01/2015  4:05 PM 07/03/2015  4:59 PM Full Code [id number]  Isaias Cowman, MD Inpatient   06/29/2015  5:51 PM 07/01/2015  4:05 PM Full Code [id number]  Fritzi Mandes, MD Inpatient   02/20/2015 11:22 PM 02/25/2015  5:53 PM Full Code [id number]  Marlyce Huge, MD ED  TOTALTIME TAKING CARE OF THIS PATIENT: 40 minutes.    Theodoro Grist M.D on 01/27/2016 at 4:01 PM  Between 7am to 6pm - Pager - [phone]  After 6pm go to [email or web address] - password [email or web address] [facility]  Office  [phone]  CC: Primary care physician; [name]., MD

## 2016-01-28 LAB — BASIC METABOLIC PANEL
ANION GAP: 7 (ref 5–15)
BUN: 33 mg/dL — ABNORMAL HIGH (ref 6–20)
CO2: 24 mmol/L (ref 22–32)
Calcium: 8.3 mg/dL — ABNORMAL LOW (ref 8.9–10.3)
Chloride: 104 mmol/L (ref 101–111)
Creatinine, Ser: 1.69 mg/dL — ABNORMAL HIGH (ref 0.44–1.00)
GFR calc Af Amer: 33 mL/min — ABNORMAL LOW (ref >60)
GFR, EST NON AFRICAN AMERICAN: 28 mL/min — AB (ref >60)
GLUCOSE: 112 mg/dL — AB (ref 65–99)
POTASSIUM: 3.8 mmol/L (ref 3.5–5.1)
Sodium: 135 mmol/L (ref 135–145)

## 2016-01-28 LAB — GLUCOSE, CAPILLARY
GLUCOSE-CAPILLARY: 26 mg/dL — AB (ref 65–99)
GLUCOSE-CAPILLARY: 63 mg/dL — AB (ref 65–99)
Glucose-Capillary: 24 mg/dL — CL (ref 65–99)
Glucose-Capillary: 165 mg/dL — ABNORMAL HIGH (ref 65–99)
Glucose-Capillary: 129 mg/dL — ABNORMAL HIGH (ref 65–99)
Glucose-Capillary: 91 mg/dL (ref 65–99)
Glucose-Capillary: 139 mg/dL — ABNORMAL HIGH (ref 65–99)

## 2016-01-28 LAB — CBC
HEMATOCRIT: 31.3 % — AB (ref 35.0–47.0)
HEMOGLOBIN: 9.9 g/dL — AB (ref 12.0–16.0)
MCH: 25.9 pg — ABNORMAL LOW (ref 26.0–34.0)
MCHC: 31.6 g/dL — AB (ref 32.0–36.0)
MCV: 82 fL (ref 80.0–100.0)
Platelets: 128 10*3/uL — ABNORMAL LOW (ref 150–440)
RBC: 3.82 MIL/uL (ref 3.80–5.20)
RDW: 17.1 % — ABNORMAL HIGH (ref 11.5–14.5)
WBC: 4.2 10*3/uL (ref 3.6–11.0)

## 2016-01-28 LAB — EXPECTORATED SPUTUM ASSESSMENT W GRAM STAIN, RFLX TO RESP C

## 2016-01-28 LAB — EXPECTORATED SPUTUM ASSESSMENT W REFEX TO RESP CULTURE

## 2016-01-28 MED ORDER — INSULIN ASPART 100 UNIT/ML ~~LOC~~ SOLN: SUBCUTANEOUS | Status: DC

## 2016-01-28 MED ORDER — SENNOSIDES-DOCUSATE SODIUM 8.6-50 MG PO TABS: 1.0000 | ORAL_TABLET | Freq: Every evening | ORAL | Status: DC | PRN

## 2016-01-28 MED ORDER — POLYETHYLENE GLYCOL 3350 17 G PO PACK
17.0000 g | PACK | Freq: Once | ORAL | Status: AC
  Administered 2016-01-28: 17 g via ORAL
  Filled 2016-01-28: qty 1

## 2016-01-28 MED ORDER — GLUCERNA SHAKE PO LIQD
237.0000 mL | Freq: Three times a day (TID) | ORAL | Status: DC
  Administered 2016-01-28: 237 mL via ORAL

## 2016-01-28 MED ORDER — DEXTROSE 50 % IV SOLN
INTRAVENOUS | Status: AC
  Administered 2016-01-28: 50 mL
  Filled 2016-01-28: qty 50

## 2016-01-28 MED ORDER — LEVOFLOXACIN 750 MG PO TABS: 750.0000 mg | ORAL_TABLET | ORAL | Status: DC

## 2016-01-28 MED ORDER — HYDROCOD POLST-CPM POLST ER 10-8 MG/5ML PO SUER: 5.0000 mL | Freq: Two times a day (BID) | ORAL | Status: DC

## 2016-01-28 MED ORDER — INSULIN DETEMIR 100 UNIT/ML ~~LOC~~ SOLN: 30.0000 [IU] | Freq: Every day | SUBCUTANEOUS | Status: DC

## 2016-01-28 NOTE — Consult Note (Signed)
Reason for Consult: Congestive heart failure leg edema shortness of breath Referring Physician: Dr. Anselm Jungling hospitalist, Dr. Frazier Richards primary  Deborah Obrien is an 75 y.o. female.  HPI: Patient's a 75 year old obese white female with multiple medical problems including hypertension diabetes chronic renal insufficiency paroxysmal atrial fibrillation with a permanent pacemaker in place he developed significant shortness of breath congestive cough was found to have a community-acquired pneumonia. Patient has been treated with broad-spectrum antibiotic therapy but recently developed lower extremity edema and concern has been for possible heart failure as a cardiology consultation was recommended. Patient has been on Lasix for the past few days and seems to have helped a little left leg is larger than the right she states. Patient denies significant sputum production does have persistent cough which is improved over the last few days. Patient states significant improvement and cardiac status since permanent pacemakers place patient has paroxysmal defibrillation is a poor anticoagulant was encountered because of GI bleeding in the past.  Past Medical History  Diagnosis Date  . Diabetes mellitus without complication ([place])   . Hypertension   . Morbid obesity ([place])   . Chronic kidney disease (CKD) stage G3a/A1, moderately decreased glomerular filtration rate (GFR) between 45-59 mL/min/1.73 square meter and albuminuria creatinine ratio less than 30 mg/g   . Paroxysmal atrial fibrillation ([facility])   . Breast mass x 1 month    3-4 o'clock left  . Anxiety   . Acid reflux   . Fatty liver   . Osteoarthritis   . TIA (transient ischemic attack)   . GI bleed   . Bowel obstruction [facility])     Past Surgical History  Procedure Laterality Date  . Shoulder arthroscopy with subacromial decompression Left   . Esophagogastroduodenoscopy N/A 01/27/2015    Procedure: ESOPHAGOGASTRODUODENOSCOPY (EGD);   Surgeon: Lollie Sails, MD;  Location: [facility] ENDOSCOPY;  Service: Endoscopy;  Laterality: N/A;  . Colon surgery    . 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 Esophagogastroduodenoscopy (egd) with propofol N/A 07/24/2015    Procedure: ESOPHAGOGASTRODUODENOSCOPY (EGD) WITH PROPOFOL;  Surgeon: Lucilla Lame, MD;  Location: [facility] ENDOSCOPY;  Service: Endoscopy;  Laterality: N/A;  . Breast biopsy Right 08/16    stereo fibroadenomatous change, neg for atypia  . Back surgery    . Replacement total knee bilateral      Family History  Problem Relation Age of Onset  . Diabetes Mellitus II Mother   . CAD Mother   . Breast cancer Neg Hx     Social History:  reports that she has never smoked. She does not have any smokeless tobacco history on file. She reports that she does not drink alcohol. Her drug history is not on file.  Allergies:  Allergies  Allergen Reactions  . Cephalosporins Other (See Comments)    Reaction:  Unknown   . Hydrocodone-Acetaminophen Hives  . Macrolides And Ketolides Other (See Comments)    Reaction:  Unknown   . Meperidine Shortness Of Breath, Nausea Only and Other (See Comments)    Reaction:  Stomach pain   . Shellfish Allergy Shortness Of Breath, Diarrhea, Nausea Only, Rash and Other (See Comments)    Reaction:  Stomach pain   . Sulfa Antibiotics Shortness Of Breath, Diarrhea, Nausea Only and Other (See Comments)    Reaction:  Stomach pain   . Aspirin Other (See Comments)    Reaction:  Stomach pain   . Celecoxib Other (See Comments)    Reaction:  GI bleed, weakness, and stomach pain.    . Cephalexin Diarrhea, Nausea Only and Other (See Comments)    Reaction:  Stomach pain   . Erythromycin Diarrhea, Nausea Only and Other (See Comments)    Reaction:  Stomach pain   . Hydromorphone Other (See Comments)    Reaction:  Hypotension    . Iodinated Diagnostic Agents Rash and Other (See Comments)    Pt states that she is unable to have because she has chronic kidney disease.    Marland Kitchen Oxycodone Other (See Comments)    Reaction:  Stomach pain   . Atorvastatin Nausea Only and Other (See Comments)    Reaction:  Weakness   . Codeine Diarrhea, Nausea Only and Other (See Comments)    Reaction:  Stomach pain   . Prednisone Other (See Comments)    Reaction:  Increases pts blood sugar  Pt states that she is allergic to all steroids.    . Tape Other (See Comments)    Reaction:  Causes pts skin to tear  Pt states that she is able to use paper tape.    . Valdecoxib Swelling and Other (See Comments)    Pt states that her hands and feet swell.    . Iodine Rash    Medications: I have reviewed the patient's current medications.  Results for orders placed or performed during the hospital encounter of 01/25/16 (from the past 48 hour(s))  Glucose, capillary     Status: Abnormal   Collection Time: 01/26/16 12:39 PM  Result Value Ref Range   Glucose-Capillary 137 (H) 65 - 99 mg/dL  Glucose, capillary     Status: Abnormal   Collection Time: 01/26/16  4:34 PM  Result Value Ref Range   Glucose-Capillary 130 (H) 65 - 99 mg/dL   Comment 1 Notify RN   Glucose, capillary     Status: Abnormal   Collection Time: 01/26/16  9:31 PM  Result Value Ref Range   Glucose-Capillary 121 (H) 65 - 99 mg/dL  Basic metabolic panel     Status: Abnormal   Collection Time: 01/27/16  5:00 AM  Result Value Ref Range   Sodium 135 135 - 145 mmol/L   Potassium 4.4 3.5 - 5.1 mmol/L   Chloride 109 101 - 111 mmol/L   CO2 21 (L) 22 - 32 mmol/L   Glucose, Bld 83 65 - 99 mg/dL   BUN 37 (H) 6 - 20 mg/dL   Creatinine, Ser 1.90 (H) 0.44 - 1.00 mg/dL   Calcium 8.1 (L) 8.9 - 10.3 mg/dL   GFR calc non Af Amer 25 (L) >60 mL/min   GFR calc Af Amer 29 (L) >60 mL/min    Comment: (NOTE) The eGFR has been calculated using the CKD EPI equation. This calculation has not  been validated in all clinical situations. eGFR's persistently <60 mL/min signify possible Chronic Kidney Disease.    Anion gap 5 5 - 15  CBC     Status: Abnormal   Collection Time: 01/27/16  5:00 AM  Result Value Ref Range   WBC 6.0 3.6 - 11.0 K/uL   RBC 3.94 3.80 - 5.20 MIL/uL   Hemoglobin 10.5 (L) 12.0 - 16.0 g/dL   HCT 33.4 (L) 35.0 - 47.0 %   MCV 84.7 80.0 - 100.0 fL   MCH 26.5 26.0 - 34.0 pg   MCHC 31.3 (L) 32.0 - 36.0 g/dL   RDW 17.5 (  H) 11.5 - 14.5 %   Platelets 127 (L) 150 - 440 K/uL  Glucose, capillary     Status: None   Collection Time: 01/27/16  7:24 AM  Result Value Ref Range   Glucose-Capillary 76 65 - 99 mg/dL  Glucose, capillary     Status: Abnormal   Collection Time: 01/27/16 11:38 AM  Result Value Ref Range   Glucose-Capillary 158 (H) 65 - 99 mg/dL  Glucose, capillary     Status: Abnormal   Collection Time: 01/27/16  4:30 PM  Result Value Ref Range   Glucose-Capillary 136 (H) 65 - 99 mg/dL  Culture, expectorated sputum-assessment     Status: None   Collection Time: 01/27/16  8:45 PM  Result Value Ref Range   Specimen Description SPUTUM    Special Requests NONE    Sputum evaluation THIS SPECIMEN IS ACCEPTABLE FOR SPUTUM CULTURE    Report Status 01/28/2016 FINAL   Culture, respiratory (NON-Expectorated)     Status: None (Preliminary result)   Collection Time: 01/27/16  8:45 PM  Result Value Ref Range   Specimen Description SPUTUM    Special Requests NONE Reflexed from [id number]    Gram Stain      FEW WBC SEEN FEW SQUAMOUS EPITHELIAL CELLS PRESENT FEW GRAM POSITIVE COCCI    Culture TOO YOUNG TO READ    Report Status PENDING   Glucose, capillary     Status: Abnormal   Collection Time: 01/27/16  9:22 PM  Result Value Ref Range   Glucose-Capillary 109 (H) 65 - 99 mg/dL  Glucose, capillary     Status: Abnormal   Collection Time: 01/28/16  2:13 AM  Result Value Ref Range   Glucose-Capillary 24 (LL) 65 - 99 mg/dL   Comment 1 Notify RN   Glucose,  capillary     Status: Abnormal   Collection Time: 01/28/16  2:17 AM  Result Value Ref Range   Glucose-Capillary 26 (LL) 65 - 99 mg/dL   Comment 1 Notify RN    Comment 2 Document in Chart    Comment 3 Repeat Test   Glucose, capillary     Status: Abnormal   Collection Time: 01/28/16  2:36 AM  Result Value Ref Range   Glucose-Capillary 165 (H) 65 - 99 mg/dL  Glucose, capillary     Status: Abnormal   Collection Time: 01/28/16  3:55 AM  Result Value Ref Range   Glucose-Capillary 129 (H) 65 - 99 mg/dL  Basic metabolic panel     Status: Abnormal   Collection Time: 01/28/16  5:17 AM  Result Value Ref Range   Sodium 135 135 - 145 mmol/L   Potassium 3.8 3.5 - 5.1 mmol/L   Chloride 104 101 - 111 mmol/L   CO2 24 22 - 32 mmol/L   Glucose, Bld 112 (H) 65 - 99 mg/dL   BUN 33 (H) 6 - 20 mg/dL   Creatinine, Ser 1.69 (H) 0.44 - 1.00 mg/dL   Calcium 8.3 (L) 8.9 - 10.3 mg/dL   GFR calc non Af Amer 28 (L) >60 mL/min   GFR calc Af Amer 33 (L) >60 mL/min    Comment: (NOTE) The eGFR has been calculated using the CKD EPI equation. This calculation has not been validated in all clinical situations. eGFR's persistently <60 mL/min signify possible Chronic Kidney Disease.    Anion gap 7 5 - 15  CBC     Status: Abnormal   Collection Time: 01/28/16  5:17 AM  Result Value Ref Range  WBC 4.2 3.6 - 11.0 K/uL   RBC 3.82 3.80 - 5.20 MIL/uL   Hemoglobin 9.9 (L) 12.0 - 16.0 g/dL   HCT 31.3 (L) 35.0 - 47.0 %   MCV 82.0 80.0 - 100.0 fL   MCH 25.9 (L) 26.0 - 34.0 pg   MCHC 31.6 (L) 32.0 - 36.0 g/dL   RDW 17.1 (H) 11.5 - 14.5 %   Platelets 128 (L) 150 - 440 K/uL  Glucose, capillary     Status: Abnormal   Collection Time: 01/28/16  7:47 AM  Result Value Ref Range   Glucose-Capillary 63 (L) 65 - 99 mg/dL   Comment 1 Notify RN   Glucose, capillary     Status: None   Collection Time: 01/28/16  8:33 AM  Result Value Ref Range   Glucose-Capillary 91 65 - 99 mg/dL  Glucose, capillary     Status: Abnormal    Collection Time: 01/28/16 11:20 AM  Result Value Ref Range   Glucose-Capillary 139 (H) 65 - 99 mg/dL   Comment 1 Notify RN     Dg Chest Port 1 View  01/26/2016  CLINICAL DATA:  Pt admitted for community acquired pneumonia. Pt with complaints of fever, cough, and abdominal pain x 1 day. 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 Pt with history of CKD, DM, and OA EXAM: CHEST  2 VIEW COMPARISON:  none FINDINGS: Persistent left retrocardiac consolidation/atelectasis with some air bronchograms evident. Patchy interstitial opacities at the right lung base. Mild cardiomegaly stable. Left subclavian pacemaker extends towards the right ventricular apex. Cervical fixation hardware noted. IMPRESSION: 1. Bibasilar airspace opacities left greater than right. 2. Mild cardiomegaly. Electronically Signed   By: Lucrezia Europe M.D.   On: 01/26/2016 13:33    Review of Systems  Constitutional: Positive for fever, chills and malaise/fatigue.  HENT: Positive for congestion.   Eyes: Negative.   Respiratory: Positive for cough and shortness of breath.   Cardiovascular: Positive for palpitations, orthopnea and leg swelling.  Gastrointestinal: Negative.   Genitourinary: Negative.   Musculoskeletal: Negative.   Neurological: Positive for weakness.  Endo/Heme/Allergies: Negative.   Psychiatric/Behavioral: Negative.    Blood pressure 117/39, pulse 66, temperature 97.6 F (36.4 C), temperature source Oral, resp. rate 18, height 5' 4"  (1.626 m), weight 117.935 kg (260 lb), SpO2 96 %. Physical Exam  Nursing note and vitals reviewed. Constitutional: She is oriented to person, place, and time. She appears well-developed and well-nourished.  HENT:  Head: Normocephalic and atraumatic.  Eyes: Conjunctivae and EOM are normal. Pupils are equal, round, and reactive to light.  Neck: Normal range of motion. Neck supple.  Cardiovascular: Normal rate, regular rhythm and normal heart sounds.   Respiratory: She has decreased  breath sounds.  Diffuse rhonchi, no significant sputum production no obvious wheezing  GI: Soft. Bowel sounds are normal.  Musculoskeletal: Normal range of motion.  Neurological: She is alert and oriented to person, place, and time. She has normal reflexes.  Skin: Skin is warm and dry. There is erythema.  Psychiatric: She has a normal mood and affect.    Assessment/Plan: Community-acquired pneumonia Chronic renal insufficiency stage IV Paroxysmal atrial fibrillation Anxiety History of GI bleeding TIA by history Morbid obesity Hypertension Diabetes Edema . Plan Agree with broad-spectrum antibiotics for pneumonia Continue supplemental oxygen Aspirin for anticoagulation for high-grade and his leg was a candidate Continue diabetes management with insulin Continue Protonix for GERD Continue inhalers for COPD Agree with pravastatin therapy for hyperlipidemia Follow-up with nephrology for renal insufficiency  Agree with hypertension control and management Agree with echocardiogram for assessment of left ventricular function Continue diuretics for edema  Riyan Haile D. 01/28/2016, 12:11 PM

## 2016-01-28 NOTE — Progress Notes (Signed)
Patient c/o "shakyness"; fsbs obtained and repeated: 24/26; one amp of Dextrose given IV; will repeat fsbs in 30 mins. [name], RN 2:30 AM 01/28/2016

## 2016-01-28 NOTE — Progress Notes (Signed)
Pt CBG is 63 at 0747 01/28/2016. A cup of apple juice and two packs of sugar was given to pt. Rechecked CBG at [phone] 01/28/2016 and is 91. Will continue to monitor pt.   Angus Seller

## 2016-01-28 NOTE — Progress Notes (Signed)
Glucose WNL's post treatment; Sputum sent to lab; lungs continue to wheeze w/o "wet" respirations; slept in chair overnight; encouraged flutter valve use; [name], RN 6:32 AM 01/28/2016

## 2016-01-28 NOTE — Progress Notes (Signed)
Inpatient Diabetes Program Recommendations  AACE/ADA: New Consensus Statement on Inpatient Glycemic Control (2015)  Target Ranges:  Prepandial:   less than 140 mg/dL      Peak postprandial:   less than 180 mg/dL (1-2 hours)      Critically ill patients:  140 - 180 mg/dL  Results for BRION, MARTORANA (MRN [id number]) as of 01/28/2016 08:54  Ref. Range 01/27/2016 07:24 01/27/2016 11:38 01/27/2016 16:30 01/27/2016 21:22 01/28/2016 02:13 01/28/2016 02:17 01/28/2016 02:36 01/28/2016 03:55 01/28/2016 07:47 01/28/2016 08:33  Glucose-Capillary Latest Ref Range: 65-99 mg/dL 76 158 (H) 136 (H) 109 (H) 24 (LL) 26 (LL) 165 (H) 129 (H) 63 (L) 91   Review of Glycemic Control  Current orders for Inpatient glycemic control: Levemir 40 units QHS, Novolog 0-20 units TID with meals, Novolog 0-5 units QHS  Inpatient Diabetes Program Recommendations: Insulin - Basal: Glucose 24 mg/dl today at 2:13 am and 63 mg/dl at 7:47 am. Please consider decreasing Levemir to 35 units QHS.  Thanks, Barnie Alderman, RN, MSN, CDE Diabetes Coordinator Inpatient Diabetes Program [phone] (Team Pager from [place] to [place]) [phone] ([place] office) [phone] [facility] office) [phone] [facility] [address] [facility] [address] [facility] office)

## 2016-01-28 NOTE — Care Management Important Message (Signed)
Important Message  Patient Details  Name: Deborah Obrien MRN: [id number] Date of Birth: [birth year]   Medicare Important Message Given:  Yes    Beverly Sessions, RN 01/28/2016, 2:47 PM

## 2016-01-28 NOTE — Progress Notes (Signed)
PT Cancellation Note  Patient Details Name: Deborah Obrien MRN: [id number] DOB: 05/18/1941   Cancelled Treatment:    Reason Eval/Treat Not Completed: Other (comment). Pt currently eating breakfast at this time, requesting therapist to return at another time.   Verneal Wiers 01/28/2016, 8:17 AM  Greggory Stallion, PT, DPT [phone]

## 2016-01-28 NOTE — Discharge Summary (Signed)
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 Diabetes mellitus without complication ([place])   . Hypertension   . Morbid obesity ([place])   . Chronic kidney disease (CKD) stage G3a/A1, moderately decreased glomerular filtration rate (GFR) between 45-59 mL/min/1.73 square meter and albuminuria creatinine ratio less than 30 mg/g   . Paroxysmal atrial fibrillation (H[facility])   . Breast mass x 1 month    3-4 o'clock left  . Anxiety   . Acid reflux   . Fatty liver   . Osteoarthritis   . TIA (transient ischemic attack)   . GI bleed   . Bowel obstruction [facility])     HOSPITAL COURSE:   #1. Sepsis due to right lower lobe pneumonia. Blood cultures are negative so far, urinary cultures revealed 1000 colony-forming units, insignificant growth, MRSA screening is negative, sputum culture is pending, I'm concerned about underlying acute on chronic diastolic CHF, so we will discontinue IV fluids as patient is not hypotensive anymore. Continue patient on Levaquin. MRSA PCR was negative now patient is off vancomycin . Repeated chest x-ray revealed a basilar airspace opacities, left greater than the right, Patient's fevers have subsided .    D/c with oral levofloxacine. #2. Right and now also likely left lower lobe pneumonia, blood cultures are negative so far, sputum cultures are pending,  not received yet, continue levofloxacin intravenously , adjust antibiotic as depending on culture results, . Continue Humibid, Tussionex, DuoNeb's. Pt felt better. #3. Hypotension, resolved on IV fluids. Holding metoprolol, but resuming Lasix. Repeated chest x-ray did not show pulmonary congestion/CHF, however, clinically she is in CHF exacerbation #4 diabetes mellitus type 2, insulin-dependent, continue insulin, Levemir, sliding scale insulin, following oral intake,    On 5/18 early morning- she had episode of Blood sugar dropped to 24- I advise to cut down levemir from 40 to 30 unit daily, and follow with PMD.   I also decreased her sliding scale coverage amount on discharge.   Educated her to check blood sugar 3 times daily. #5. Diarrhea, no further diarrhea was noted during this admission, last bowel movement was 15th of May 2017, get stool cultures as needed and adjust medications depending on culture results   Now she have constipation for last 3 days. #6. Nausea and vomiting, continue Protonix, sucralfate, Compazine, IV fluids, improved clinically. Patient ate only 50-100% of offered meals today #7. Acute on chronic diastolic CHF, getting cardiologist involved, and reinitiating patient on Lasix, following in's and outs and kidney function closely, back to baseline. #8. Chronic renal insufficiency, CKD, stage IV, follow with diuresis  DISCHARGE CONDITIONS:   Stable.  CONSULTS OBTAINED:  Treatment Team:  Corey Skains, MD Yolonda Kida, MD  DRUG ALLERGIES:   Allergies  Allergen Reactions  . Cephalosporins Other (See Comments)    Reaction:  Unknown   . Hydrocodone-Acetaminophen Hives  . Macrolides And Ketolides Other (See Comments)    Reaction:  Unknown   . Meperidine Shortness Of Breath, Nausea Only and Other (See Comments)    Reaction:  Stomach pain   . Shellfish Allergy Shortness Of Breath, Diarrhea, Nausea Only, Rash and Other (See Comments)    Reaction:  Stomach  pain   . Sulfa Antibiotics Shortness Of Breath, Diarrhea, Nausea Only and Other (See Comments)    Reaction:  Stomach pain   . Aspirin Other (See Comments)    Reaction:  Stomach pain   . Celecoxib Other (See Comments)    Reaction:  GI bleed, weakness, and stomach pain.    . Cephalexin Diarrhea, Nausea Only and Other (See Comments)    Reaction:  Stomach pain   . Erythromycin Diarrhea, Nausea Only and Other (See Comments)    Reaction:  Stomach pain   . Hydromorphone Other (See Comments)    Reaction:  Hypotension   . Iodinated Diagnostic Agents Rash and Other (See Comments)    Pt states that she is unable to have because she has chronic kidney disease.    Marland Kitchen Oxycodone Other (See Comments)    Reaction:  Stomach pain   . Atorvastatin Nausea Only and Other (See Comments)    Reaction:  Weakness   . Codeine Diarrhea, Nausea Only and Other (See Comments)    Reaction:  Stomach pain   . Prednisone Other (See Comments)    Reaction:  Increases pts blood sugar  Pt states that she is allergic to all steroids.    . Tape Other (See Comments)    Reaction:  Causes pts skin to tear  Pt states that she is able to use paper tape.    . Valdecoxib Swelling and Other (See Comments)    Pt states that her hands and feet swell.    . Iodine Rash    DISCHARGE MEDICATIONS:   Current Discharge Medication List    START taking these medications   Details  chlorpheniramine-HYDROcodone (TUSSIONEX) 10-8 MG/5ML SUER Take 5 mLs by mouth every 12 (twelve) hours. Qty: 115 mL, Refills: 0    levofloxacin (LEVAQUIN) 750 MG tablet Take 1 tablet (750 mg total) by mouth every other day. Qty: 3 tablet, Refills: 0    senna-docusate (SENOKOT-S) 8.6-50 MG tablet Take 1 tablet by mouth at bedtime as needed for mild constipation. Qty: 30 tablet, Refills: 0      CONTINUE these medications which have CHANGED   Details  insulin aspart (NOVOLOG) 100 UNIT/ML injection USE 3 times a day before meal- after checking blood  sugar. ( CBG = Blood sugar level checked by Glucometer at home) CBG 70 - 120: 0 units CBG 121 - 150: 3 units CBG 151 - 200: 4 units CBG 201 - 250: 7 units CBG 251 - 300: 11 units CBG 301 - 350: 15 units CBG 351 - 400: 20 units. Qty: 10 mL, Refills: 11    insulin detemir (LEVEMIR) 100 UNIT/ML injection Inject 0.3 mLs (30 Units total) into the skin at bedtime. Qty: 10 mL, Refills: 1      CONTINUE these medications which have NOT CHANGED   Details  APPLE CIDER VINEGAR PO Take 1 tablet by mouth 2 (two) times daily.    aspirin EC 81 MG tablet Take 81 mg by mouth at bedtime.    Calcium Carbonate-Vitamin D (CALCIUM 600+D) 600-400 MG-UNIT tablet Take 1 tablet by mouth 2 (two) times daily.    Cholecalciferol (VITAMIN D3) 400 UNITS tablet Take 400-800 Units by mouth 2 (two) times daily. Pt takes two tablets  in the morning and one tablet at bedtime.    CINNAMON PO Take 2 tablets by mouth 2 (two) times daily.    Cranberry-Vitamin C (AZO CRANBERRY URINARY TRACT) 250-60 MG CAPS Take 2 capsules by mouth 2 (two) times daily.    docusate sodium (COLACE) 100 MG capsule Take 200 mg by mouth at bedtime.    furosemide (LASIX) 40 MG tablet Take 40 mg by mouth daily as needed for edema.    hydrocortisone 2.5 % cream Apply 1 application topically at bedtime as needed (for rash under breasts).     ketoconazole (NIZORAL) 2 % cream Apply 1 application topically every 2 (two) hours as needed (for rash under breasts).     Multiple Vitamin (MULTIVITAMIN WITH MINERALS) TABS tablet Take 1 tablet by mouth daily.    mupirocin cream (BACTROBAN) 2 % Apply 1 application topically daily as needed.    oxybutynin (DITROPAN) 5 MG tablet Take 1 tablet by mouth 3 (three) times daily.    oxybutynin (DITROPAN-XL) 10 MG 24 hr tablet Take 10 mg by mouth at bedtime.    pantoprazole (PROTONIX) 40 MG tablet Take 40 mg by mouth 2 (two) times daily.    PARoxetine (PAXIL) 20 MG tablet Take 20 mg by mouth daily.     pravastatin (PRAVACHOL) 20 MG tablet Take 20 mg by mouth at bedtime.     sucralfate (CARAFATE) 1 G tablet Take 1 g by mouth 2 (two) times daily.     talc (ZEASORB) powder Apply 1 application topically as needed (for rash under breasts).    traZODone (DESYREL) 50 MG tablet Take 2 tablets by mouth at bedtime.      STOP taking these medications     doxycycline (VIBRA-TABS) 100 MG tablet      metoprolol tartrate (LOPRESSOR) 25 MG tablet      simvastatin (ZOCOR) 20 MG tablet          DISCHARGE INSTRUCTIONS:    Follow with PMD in 1 week.  If you experience worsening of your admission symptoms, develop shortness of breath, life threatening emergency, suicidal or homicidal thoughts you must seek medical attention immediately by calling 911 or calling your MD immediately  if symptoms less severe.  You Must read complete instructions/literature along with all the possible adverse reactions/side effects for all the Medicines you take and that have been prescribed to you. Take any new Medicines after you have completely understood and accept all the possible adverse reactions/side effects.   Please note  You were cared for by a hospitalist during your hospital stay. If you have any questions about your discharge medications or the care you received while you were in the hospital after you are discharged, you can call the unit and asked to speak with the hospitalist on call if the hospitalist that took care of you is not available. Once you are discharged, your primary care physician will handle any further medical issues. Please note that NO REFILLS for any discharge medications will be authorized once you are discharged, as it is imperative that you return to your primary care physician (or establish a relationship with a primary care physician if you do not have one) for your aftercare needs so that they can reassess your need for medications and monitor your lab values.    Today    CHIEF COMPLAINT:   Chief Complaint  Patient presents with  . Fever  . Cough  . Abdominal Pain  . Emesis  . Diarrhea  HISTORY OF PRESENT ILLNESS:  Deborah Obrien  is a 75 y.o. female with a known history of Hypertension, diabetes mellitus, chronic kidney disease stage stage IV, proximal atrial fibrillation, osteoarthritis presented to the emergency room with fever and chills for 1 day duration. Patient is generalized body aches and weakness. Patient also complains of productive cough of yellowish phlegm. Patient was evaluated in the emergency room was found to have low blood pressure and was given IV fluids based on sepsis protocol. She had fever and was started on IV antibiotics. Workup was done with a CT abdomen which showed right lung pneumonia. No history of fall or head injury. No history of any headache, dizziness or blurry vision. Hospitalist service was consulted for further care. No history of recent travel or sick contacts at home. Patient is up-to-date with flu vaccination pneumococcal vaccination.   VITAL SIGNS:  Blood pressure 117/39, pulse 66, temperature 97.6 F (36.4 C), temperature source Oral, resp. rate 18, height 5\' 4"  (1.626 m), weight 117.935 kg (260 lb), SpO2 97 %.  I/O:   Intake/Output Summary (Last 24 hours) at 01/28/16 1050 Last data filed at 01/28/16 0900  Gross per 24 hour  Intake   1020 ml  Output    775 ml  Net    245 ml    PHYSICAL EXAMINATION:   GENERAL: 75 y.o.-year-old patient sitting in the recliner , moderate distress due to cough, dyspnea, especially on exertion, talking and coughing. EYES: Pupils equal, round, reactive to light and accommodation. No scleral icterus. Extraocular muscles intact.  HEENT: Head atraumatic, normocephalic. Oropharynx and nasopharynx clear.  NECK: Supple, no jugular venous distention. No thyroid enlargement, no tenderness.  LUNGS: Decreased breath sounds bilaterally, intermittent wheezing at bases,  inspiratory / expiratory, bilateral basal rales, rhonchi and crepitations in both lungs . Intermittent use of accessory muscles of respiration, especially with long sentences.  CARDIOVASCULAR: S1, S2 normal. No murmurs, rubs, or gallops.  ABDOMEN: Soft, nontender, nondistended. Bowel sounds present. No organomegaly or mass.  EXTREMITIES: Trace pedal edema, no cyanosis, or clubbing.  NEUROLOGIC: Cranial nerves II through XII are intact. Muscle strength 5/5 in all extremities. Sensation intact. Gait not checked.  PSYCHIATRIC: The patient is alert and oriented x 3. Uncomfortable, dyspneic SKIN: No obvious rash, lesion, or ulcer.   DATA REVIEW:   CBC  Recent Labs Lab 01/28/16 0517  WBC 4.2  HGB 9.9*  HCT 31.3*  PLT 128*    Chemistries   Recent Labs Lab 01/25/16 1049  01/28/16 0517  NA 135  < > 135  K 3.9  < > 3.8  CL 105  < > 104  CO2 23  < > 24  GLUCOSE 134*  < > 112*  BUN 30*  < > 33*  CREATININE 1.49*  < > 1.69*  CALCIUM 7.8*  < > 8.3*  AST 51*  --   --   ALT 24  --   --   ALKPHOS 118  --   --   BILITOT 1.4*  --   --   < > = values in this interval not displayed.  Cardiac Enzymes  Recent Labs Lab 01/25/16 0043  TROPONINI <0.03    Microbiology Results  Results for orders placed or performed during the hospital encounter of 01/25/16  Blood Culture (routine x 2)     Status: None (Preliminary result)   Collection Time: 01/25/16 12:53 AM  Result Value Ref Range Status   Specimen Description BLOOD LEFT HAND  Final  Special Requests BOTTLES DRAWN AEROBIC AND ANAEROBIC [place]  Final   Culture NO GROWTH 1 DAY  Final   Report Status PENDING  Incomplete  Blood Culture (routine x 2)     Status: None (Preliminary result)   Collection Time: 01/25/16 12:53 AM  Result Value Ref Range Status   Specimen Description BLOOD LEFT FOREARM  Final   Special Requests BOTTLES DRAWN AEROBIC AND ANAEROBIC [place]  Final   Culture NO GROWTH 1 DAY  Final   Report  Status PENDING  Incomplete  Urine culture     Status: Abnormal   Collection Time: 01/25/16  2:20 AM  Result Value Ref Range Status   Specimen Description URINE, RANDOM  Final   Special Requests NONE  Final   Culture 1,000 COLONIES/mL INSIGNIFICANT GROWTH (A)  Final   Report Status 01/26/2016 FINAL  Final  MRSA PCR Screening     Status: None   Collection Time: 01/25/16 12:23 PM  Result Value Ref Range Status   MRSA by PCR NEGATIVE NEGATIVE Final    Comment:        The GeneXpert MRSA Assay (FDA approved for NASAL specimens only), is one component of a comprehensive MRSA colonization surveillance program. It is not intended to diagnose MRSA infection nor to guide or monitor treatment for MRSA infections.   Culture, expectorated sputum-assessment     Status: None   Collection Time: 01/27/16  8:45 PM  Result Value Ref Range Status   Specimen Description SPUTUM  Final   Special Requests NONE  Final   Sputum evaluation THIS SPECIMEN IS ACCEPTABLE FOR SPUTUM CULTURE  Final   Report Status 01/28/2016 FINAL  Final  Culture, respiratory (NON-Expectorated)     Status: None (Preliminary result)   Collection Time: 01/27/16  8:45 PM  Result Value Ref Range Status   Specimen Description SPUTUM  Final   Special Requests NONE Reflexed from [id number]  Final   Gram Stain   Final    FEW WBC SEEN FEW SQUAMOUS EPITHELIAL CELLS PRESENT FEW GRAM POSITIVE COCCI    Culture TOO YOUNG TO READ  Final   Report Status PENDING  Incomplete    RADIOLOGY:  Dg Chest Port 1 View  01/26/2016  CLINICAL DATA:  Pt admitted for community acquired pneumonia. Pt with complaints of fever, cough, and abdominal pain x 1 day. 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 Pt with history of CKD, DM, and OA EXAM: CHEST  2 VIEW COMPARISON:  none FINDINGS: Persistent left retrocardiac consolidation/atelectasis with some air bronchograms evident. Patchy interstitial opacities at the right lung base. Mild cardiomegaly stable.  Left subclavian pacemaker extends towards the right ventricular apex. Cervical fixation hardware noted. IMPRESSION: 1. Bibasilar airspace opacities left greater than right. 2. Mild cardiomegaly. Electronically Signed   By: Lucrezia Europe M.D.   On: 01/26/2016 13:33    EKG:   Orders placed or performed during the hospital encounter of 01/25/16  . EKG 12-Lead  . EKG 12-Lead      Management plans discussed with the patient, family and they are in agreement.  CODE STATUS:  Full.    Code Status Orders        Start     Ordered   01/25/16 [id number]  Full code   Continuous     01/25/16 0622    Code Status History    Date Active Date Inactive Code Status Order ID Comments User Context   07/22/2015  1:07 AM 07/25/2015  4:09 PM Full Code  [id number]  Harrie Foreman, MD Inpatient   07/01/2015  4:05 PM 07/03/2015  4:59 PM Full Code [id number]  Isaias Cowman, MD Inpatient   06/29/2015  5:51 PM 07/01/2015  4:05 PM Full Code [id number]  Fritzi Mandes, MD Inpatient   02/20/2015 11:22 PM 02/25/2015  5:53 PM Full Code [id number]  Marlyce Huge, MD ED      TOTAL TIME TAKING CARE OF THIS PATIENT: 35 minutes.    Vaughan Basta M.D on 01/28/2016 at 10:50 AM  Between 7am to 6pm - Pager - [phone]  After 6pm go to [email or web address] - password [email or web address]  Office  [phone]  CC: Primary care physician; [name]., MD   Note: This dictation was prepared with Dragon dictation along with smaller phrase technology. Any transcriptional errors that result from this process are unintentional.

## 2016-01-28 NOTE — Care Management (Signed)
Patient to discharge today.  Home health orders and face to face completed.  I have notified Tim with [name] of pending discharge.  RNCM signing off.

## 2016-01-28 NOTE — Progress Notes (Signed)
Patient discharge teaching given, including activity, diet, follow-up appoints, and medications. Patient verbalized understanding of all discharge instructions. IV access was d/c'd. Vitals are stable. Skin is intact except as charted in most recent assessments. Pt to be escorted out by volunteer, to be driven home by family.  Angus Seller

## 2016-01-30 LAB — CULTURE, RESPIRATORY W GRAM STAIN

## 2016-01-30 LAB — CULTURE, RESPIRATORY: CULTURE: NORMAL

## 2016-01-30 LAB — CULTURE, BLOOD (ROUTINE X 2)
Culture: NO GROWTH
Culture: NO GROWTH

## 2016-02-22 DIAGNOSIS — N200 Calculus of kidney: Secondary | ICD-10-CM | POA: Insufficient documentation

## 2016-03-04 ENCOUNTER — Other Ambulatory Visit: Payer: Self-pay | Admitting: Internal Medicine

## 2016-03-04 DIAGNOSIS — Z1231 Encounter for screening mammogram for malignant neoplasm of breast: Secondary | ICD-10-CM

## 2016-04-11 ENCOUNTER — Other Ambulatory Visit: Payer: Self-pay | Admitting: Internal Medicine

## 2016-04-11 ENCOUNTER — Ambulatory Visit: Payer: Medicare Other

## 2016-04-11 ENCOUNTER — Ambulatory Visit
Admission: RE | Admit: 2016-04-11 | Discharge: 2016-04-11 | Disposition: A | Discharge status: Home / Self Care | Payer: Medicare Other | Source: Ambulatory Visit | Attending: Internal Medicine | Admitting: Internal Medicine

## 2016-04-11 DIAGNOSIS — N644 Mastodynia: Secondary | ICD-10-CM

## 2016-04-11 DIAGNOSIS — Z1231 Encounter for screening mammogram for malignant neoplasm of breast: Secondary | ICD-10-CM

## 2016-08-25 ENCOUNTER — Ambulatory Visit
Admission: RE | Admit: 2016-08-25 | Discharge: 2016-08-25 | Disposition: A | Discharge status: Home / Self Care | Payer: Medicare Other | Source: Ambulatory Visit | Attending: Internal Medicine | Admitting: Internal Medicine

## 2016-08-25 ENCOUNTER — Encounter
Admission: RE | Disposition: A | Discharge status: Home / Self Care | Payer: Self-pay | Source: Ambulatory Visit | Attending: Internal Medicine

## 2016-08-25 DIAGNOSIS — E1122 Type 2 diabetes mellitus with diabetic chronic kidney disease: Secondary | ICD-10-CM | POA: Diagnosis not present

## 2016-08-25 DIAGNOSIS — Z794 Long term (current) use of insulin: Secondary | ICD-10-CM | POA: Insufficient documentation

## 2016-08-25 DIAGNOSIS — I25118 Atherosclerotic heart disease of native coronary artery with other forms of angina pectoris: Secondary | ICD-10-CM | POA: Insufficient documentation

## 2016-08-25 DIAGNOSIS — I4891 Unspecified atrial fibrillation: Secondary | ICD-10-CM | POA: Diagnosis not present

## 2016-08-25 DIAGNOSIS — N189 Chronic kidney disease, unspecified: Secondary | ICD-10-CM | POA: Insufficient documentation

## 2016-08-25 DIAGNOSIS — I2510 Atherosclerotic heart disease of native coronary artery without angina pectoris: Secondary | ICD-10-CM

## 2016-08-25 DIAGNOSIS — I10 Essential (primary) hypertension: Secondary | ICD-10-CM | POA: Insufficient documentation

## 2016-08-25 DIAGNOSIS — M109 Gout, unspecified: Secondary | ICD-10-CM | POA: Diagnosis not present

## 2016-08-25 DIAGNOSIS — E7800 Pure hypercholesterolemia, unspecified: Secondary | ICD-10-CM | POA: Diagnosis not present

## 2016-08-25 DIAGNOSIS — Z792 Long term (current) use of antibiotics: Secondary | ICD-10-CM | POA: Insufficient documentation

## 2016-08-25 DIAGNOSIS — F419 Anxiety disorder, unspecified: Secondary | ICD-10-CM | POA: Diagnosis not present

## 2016-08-25 DIAGNOSIS — Z8673 Personal history of transient ischemic attack (TIA), and cerebral infarction without residual deficits: Secondary | ICD-10-CM | POA: Diagnosis not present

## 2016-08-25 DIAGNOSIS — K219 Gastro-esophageal reflux disease without esophagitis: Secondary | ICD-10-CM | POA: Diagnosis not present

## 2016-08-25 DIAGNOSIS — Z950 Presence of cardiac pacemaker: Secondary | ICD-10-CM | POA: Diagnosis not present

## 2016-08-25 DIAGNOSIS — I340 Nonrheumatic mitral (valve) insufficiency: Secondary | ICD-10-CM | POA: Diagnosis not present

## 2016-08-25 DIAGNOSIS — I129 Hypertensive chronic kidney disease with stage 1 through stage 4 chronic kidney disease, or unspecified chronic kidney disease: Secondary | ICD-10-CM | POA: Diagnosis not present

## 2016-08-25 DIAGNOSIS — I208 Other forms of angina pectoris: Secondary | ICD-10-CM | POA: Diagnosis present

## 2016-08-25 DIAGNOSIS — Z7982 Long term (current) use of aspirin: Secondary | ICD-10-CM | POA: Insufficient documentation

## 2016-08-25 DIAGNOSIS — Z79899 Other long term (current) drug therapy: Secondary | ICD-10-CM | POA: Insufficient documentation

## 2016-08-25 DIAGNOSIS — Z96653 Presence of artificial knee joint, bilateral: Secondary | ICD-10-CM | POA: Insufficient documentation

## 2016-08-25 HISTORY — PX: CARDIAC CATHETERIZATION: SHX172

## 2016-08-25 HISTORY — DX: Atherosclerotic heart disease of native coronary artery without angina pectoris: I25.10

## 2016-08-25 SURGERY — LEFT HEART CATH AND CORONARY ANGIOGRAPHY
Anesthesia: Moderate Sedation | Laterality: Left

## 2016-08-25 MED ORDER — ASPIRIN 81 MG PO CHEW
CHEWABLE_TABLET | ORAL | Status: AC
  Administered 2016-08-25: 81 mg via ORAL
  Filled 2016-08-25: qty 1

## 2016-08-25 MED ORDER — SODIUM CHLORIDE 0.9% FLUSH: 3.0000 mL | INTRAVENOUS | Status: DC | PRN

## 2016-08-25 MED ORDER — SODIUM CHLORIDE 0.9% FLUSH: 3.0000 mL | Freq: Two times a day (BID) | INTRAVENOUS | Status: DC

## 2016-08-25 MED ORDER — SODIUM CHLORIDE 0.9 % IV SOLN: 250.0000 mL | INTRAVENOUS | Status: DC | PRN

## 2016-08-25 MED ORDER — DIPHENHYDRAMINE HCL 25 MG PO CAPS
ORAL_CAPSULE | ORAL | Status: AC
  Administered 2016-08-25: 50 mg via ORAL
  Filled 2016-08-25: qty 2

## 2016-08-25 MED ORDER — HEPARIN (PORCINE) IN NACL 2-0.9 UNIT/ML-% IJ SOLN
INTRAMUSCULAR | Status: AC
  Filled 2016-08-25: qty 500

## 2016-08-25 MED ORDER — FAMOTIDINE 20 MG PO TABS
ORAL_TABLET | ORAL | Status: AC
  Administered 2016-08-25: 20 mg
  Filled 2016-08-25: qty 1

## 2016-08-25 MED ORDER — FENTANYL CITRATE (PF) 100 MCG/2ML IJ SOLN
INTRAMUSCULAR | Status: AC
  Filled 2016-08-25: qty 2

## 2016-08-25 MED ORDER — DIPHENHYDRAMINE HCL 25 MG PO CAPS
50.0000 mg | ORAL_CAPSULE | Freq: Once | ORAL | Status: AC
  Administered 2016-08-25: 50 mg via ORAL

## 2016-08-25 MED ORDER — SODIUM CHLORIDE 0.9 % WEIGHT BASED INFUSION: 1.0000 mL/kg/h | INTRAVENOUS | Status: DC

## 2016-08-25 MED ORDER — MIDAZOLAM HCL 2 MG/2ML IJ SOLN
INTRAMUSCULAR | Status: DC | PRN
  Administered 2016-08-25: 1 mg via INTRAVENOUS

## 2016-08-25 MED ORDER — MIDAZOLAM HCL 2 MG/2ML IJ SOLN
INTRAMUSCULAR | Status: AC
  Filled 2016-08-25: qty 2

## 2016-08-25 MED ORDER — SODIUM CHLORIDE 0.9 % WEIGHT BASED INFUSION: 3.0000 mL/kg/h | INTRAVENOUS | Status: DC

## 2016-08-25 MED ORDER — ACETAMINOPHEN 325 MG PO TABS: 650.0000 mg | ORAL_TABLET | ORAL | Status: DC | PRN

## 2016-08-25 MED ORDER — ASPIRIN 81 MG PO CHEW
81.0000 mg | CHEWABLE_TABLET | ORAL | Status: AC
  Administered 2016-08-25: 81 mg via ORAL

## 2016-08-25 MED ORDER — FENTANYL CITRATE (PF) 100 MCG/2ML IJ SOLN
INTRAMUSCULAR | Status: DC | PRN
  Administered 2016-08-25: 25 ug via INTRAVENOUS

## 2016-08-25 MED ORDER — METHYLPREDNISOLONE SODIUM SUCC 125 MG IJ SOLR
INTRAMUSCULAR | Status: AC
  Filled 2016-08-25: qty 2

## 2016-08-25 MED ORDER — IOPAMIDOL (ISOVUE-300) INJECTION 61%
INTRAVENOUS | Status: DC | PRN
  Administered 2016-08-25: 120 mL via INTRA_ARTERIAL

## 2016-08-25 MED ORDER — ONDANSETRON HCL 4 MG/2ML IJ SOLN: 4.0000 mg | Freq: Four times a day (QID) | INTRAMUSCULAR | Status: DC | PRN

## 2016-08-25 SURGICAL SUPPLY — 8 items
CATH INFINITI 5FR ANG PIGTAIL (CATHETERS) ×2 IMPLANT
CATH INFINITI 5FR JL4 (CATHETERS) ×2 IMPLANT
CATH INFINITI JR4 5F (CATHETERS) ×2 IMPLANT
KIT MANI 3VAL PERCEP (MISCELLANEOUS) ×2 IMPLANT
NEEDLE PERC 18GX7CM (NEEDLE) ×2 IMPLANT
PACK CARDIAC CATH (CUSTOM PROCEDURE TRAY) ×2 IMPLANT
SHEATH PINNACLE 5F 10CM (SHEATH) ×2 IMPLANT
WIRE EMERALD 3MM-J .035X150CM (WIRE) ×2 IMPLANT

## 2016-08-25 NOTE — Progress Notes (Signed)
Pt states BG 127 at home this AM 1 hr prior to arrival

## 2016-08-25 NOTE — Progress Notes (Signed)
Pt sat up with site check wnl. Pt eating sandwich. Husband at bedside, pt and husband both verbalize dc info

## 2016-08-25 NOTE — Discharge Instructions (Signed)
Angiogram, Care After °These instructions give you information about caring for yourself after your procedure. Your doctor may also give you more specific instructions. Call your doctor if you have any problems or questions after your procedure. °Follow these instructions at home: °· Take medicines only as told by your doctor. °· Follow your doctor's instructions about: °¨ Care of the area where the tube was inserted. °¨ Bandage (dressing) changes and removal. °· You may shower 24-48 hours after the procedure or as told by your doctor. °· Do not take baths, swim, or use a hot tub until your doctor approves. °· Every day, check the area where the tube was inserted. Watch for: °¨ Redness, swelling, or pain. °¨ Fluid, blood, or pus. °· Do not apply powder or lotion to the site. °· Do not lift anything that is heavier than 10 lb (4.5 kg) for 5 days or as told by your doctor. °· Ask your doctor when you can: °¨ Return to work or school. °¨ Do physical activities or play sports. °¨ Have sex. °· Do not drive or operate heavy machinery for 24 hours or as told by your doctor. °· Have someone with you for the first 24 hours after the procedure. °· Keep all follow-up visits as told by your doctor. This is important. °Contact a health care provider if: °· You have a fever. °· You have chills. °· You have more bleeding from the area where the tube was inserted. Hold pressure on the area. °· You have redness, swelling, or pain in the area where the tube was inserted. °· You have fluid or pus coming from the area. °Get help right away if: °· You have a lot of pain in the area where the tube was inserted. °· The area where the tube was inserted is bleeding, and the bleeding does not stop after 30 minutes of holding steady pressure on the area. °· The area near or just beyond the insertion site becomes pale, cool, tingly, or numb. °This information is not intended to replace advice given to you by your health care provider. Make  sure you discuss any questions you have with your health care provider. °Document Released: 11/25/2008 Document Revised: 02/04/2016 Document Reviewed: 01/30/2013 °Elsevier Interactive Patient Education © 2017 [employer] ° °

## 2016-09-06 ENCOUNTER — Encounter
Admission: RE | Admit: 2016-09-06 | Discharge: 2016-09-06 | Disposition: A | Discharge status: Home / Self Care | Payer: Medicare Other | Source: Ambulatory Visit | Attending: Orthopedic Surgery | Admitting: Orthopedic Surgery

## 2016-09-06 HISTORY — DX: Dyspnea, unspecified: R06.00

## 2016-09-06 HISTORY — DX: Heart failure, unspecified: I50.9

## 2016-09-06 HISTORY — DX: Presence of cardiac pacemaker: Z95.0

## 2016-09-06 NOTE — Pre-Procedure Instructions (Addendum)
STRESS/ECHO, CBC, MET B IN CARE EVERYWHERE. Cardiology note on chart. Reviewed medical history Dr Andree Elk. No new orders

## 2016-09-06 NOTE — Patient Instructions (Addendum)
Your procedure is scheduled on: Wednesday 09/15/15 Report to [place]. 2ND FLOOR MEDICAL MALL ENTRANCE. To find out your arrival time please call [phone] between 1PM - 3PM on Tuesday 09/14/15.  Remember: Instructions that are not followed completely may result in serious medical risk, up to and including death, or upon the discretion of your surgeon and anesthesiologist your surgery may need to be rescheduled.    __X__ 1. Do not eat food or drink liquids after midnight. No gum chewing or hard candies.     __X__ 2. No Alcohol for 24 hours before or after surgery.   ____ 3. Bring all medications with you on the day of surgery if instructed.    __X__ 4. Notify your doctor if there is any change in your medical condition     (cold, fever, infections).             ___X__5. No smoking within 24 hours of your surgery.     Do not wear jewelry, make-up, hairpins, clips or nail polish.  Do not wear lotions, powders, or perfumes.   Do not shave 48 hours prior to surgery. Men may shave face and neck.  Do not bring valuables to the hospital.    [facility] is not responsible for any belongings or valuables.               Contacts, dentures or bridgework may not be worn into surgery.  Leave your suitcase in the car. After surgery it may be brought to your room.  For patients admitted to the hospital, discharge time is determined by your                treatment team.   Patients discharged the day of surgery will not be allowed to drive home.   Please read over the following fact sheets that you were given:   Pain Booklet and MRSA Information   __X__ Take these medicines the morning of surgery with A SIP OF WATER:    1. BUSPIRONE  2. METOPROLOL  3. PANTOPRAZOLE  4. PAROXETINE  5.  6.  ____ Fleet Enema (as directed)   __X__ Use CHG Soap as directed  ____ Use inhalers on the day of surgery  ____ Stop metformin 2 days prior to surgery    __X__ Take 1/2 of usual insulin dose the  night before surgery and none on the morning of surgery.   __X__ Stop Coumadin/Plavix/aspirin on 7 DAYS PRIOR TO SURGERY OR AS INSTRUCTED BY YOUR DOCTOR  __X__ Stop Anti-inflammatories such as Advil, Aleve, Ibuprofen, Motrin, Naproxen, Naprosyn, Goodies,powder, or aspirin products.  OK to take Tylenol.   __X__ Stop supplements until after surgery.  TART CHERRY, CRANBERRY  ____ Bring C-Pap to the hospital.

## 2016-09-08 DIAGNOSIS — I10 Essential (primary) hypertension: Secondary | ICD-10-CM | POA: Insufficient documentation

## 2016-09-14 ENCOUNTER — Ambulatory Visit: Payer: Medicare Other | Admitting: Anesthesiology

## 2016-09-14 ENCOUNTER — Encounter
Admission: RE | Disposition: A | Discharge status: Home / Self Care | Payer: Self-pay | Source: Ambulatory Visit | Attending: Orthopedic Surgery

## 2016-09-14 ENCOUNTER — Ambulatory Visit
Admission: RE | Admit: 2016-09-14 | Discharge: 2016-09-14 | Disposition: A | Discharge status: Home / Self Care | Payer: Medicare Other | Source: Ambulatory Visit | Attending: Orthopedic Surgery | Admitting: Orthopedic Surgery

## 2016-09-14 DIAGNOSIS — Z885 Allergy status to narcotic agent status: Secondary | ICD-10-CM | POA: Diagnosis not present

## 2016-09-14 DIAGNOSIS — Z91041 Radiographic dye allergy status: Secondary | ICD-10-CM | POA: Insufficient documentation

## 2016-09-14 DIAGNOSIS — I509 Heart failure, unspecified: Secondary | ICD-10-CM | POA: Insufficient documentation

## 2016-09-14 DIAGNOSIS — Z794 Long term (current) use of insulin: Secondary | ICD-10-CM | POA: Insufficient documentation

## 2016-09-14 DIAGNOSIS — Z882 Allergy status to sulfonamides status: Secondary | ICD-10-CM | POA: Insufficient documentation

## 2016-09-14 DIAGNOSIS — Z6841 Body Mass Index (BMI) 40.0 and over, adult: Secondary | ICD-10-CM | POA: Insufficient documentation

## 2016-09-14 DIAGNOSIS — M810 Age-related osteoporosis without current pathological fracture: Secondary | ICD-10-CM | POA: Insufficient documentation

## 2016-09-14 DIAGNOSIS — K760 Fatty (change of) liver, not elsewhere classified: Secondary | ICD-10-CM | POA: Insufficient documentation

## 2016-09-14 DIAGNOSIS — Z950 Presence of cardiac pacemaker: Secondary | ICD-10-CM | POA: Insufficient documentation

## 2016-09-14 DIAGNOSIS — K219 Gastro-esophageal reflux disease without esophagitis: Secondary | ICD-10-CM | POA: Diagnosis not present

## 2016-09-14 DIAGNOSIS — I110 Hypertensive heart disease with heart failure: Secondary | ICD-10-CM | POA: Diagnosis not present

## 2016-09-14 DIAGNOSIS — E7800 Pure hypercholesterolemia, unspecified: Secondary | ICD-10-CM | POA: Diagnosis not present

## 2016-09-14 DIAGNOSIS — Z886 Allergy status to analgesic agent status: Secondary | ICD-10-CM | POA: Insufficient documentation

## 2016-09-14 DIAGNOSIS — E119 Type 2 diabetes mellitus without complications: Secondary | ICD-10-CM | POA: Insufficient documentation

## 2016-09-14 DIAGNOSIS — Z91013 Allergy to seafood: Secondary | ICD-10-CM | POA: Diagnosis not present

## 2016-09-14 DIAGNOSIS — Z881 Allergy status to other antibiotic agents status: Secondary | ICD-10-CM | POA: Insufficient documentation

## 2016-09-14 DIAGNOSIS — M654 Radial styloid tenosynovitis [de Quervain]: Secondary | ICD-10-CM | POA: Insufficient documentation

## 2016-09-14 DIAGNOSIS — Z86010 Personal history of colonic polyps: Secondary | ICD-10-CM | POA: Insufficient documentation

## 2016-09-14 DIAGNOSIS — Z888 Allergy status to other drugs, medicaments and biological substances status: Secondary | ICD-10-CM | POA: Diagnosis not present

## 2016-09-14 DIAGNOSIS — M1990 Unspecified osteoarthritis, unspecified site: Secondary | ICD-10-CM | POA: Diagnosis not present

## 2016-09-14 DIAGNOSIS — Z8673 Personal history of transient ischemic attack (TIA), and cerebral infarction without residual deficits: Secondary | ICD-10-CM | POA: Insufficient documentation

## 2016-09-14 DIAGNOSIS — I480 Paroxysmal atrial fibrillation: Secondary | ICD-10-CM | POA: Diagnosis not present

## 2016-09-14 HISTORY — PX: DORSAL COMPARTMENT RELEASE: SHX5039

## 2016-09-14 LAB — GLUCOSE, CAPILLARY
Glucose-Capillary: 295 mg/dL — ABNORMAL HIGH (ref 65–99)
Glucose-Capillary: 238 mg/dL — ABNORMAL HIGH (ref 65–99)

## 2016-09-14 SURGERY — RELEASE, FIRST DORSAL COMPARTMENT, HAND
Anesthesia: General | Site: Wrist | Laterality: Left | Wound class: Clean

## 2016-09-14 MED ORDER — FENTANYL CITRATE (PF) 100 MCG/2ML IJ SOLN
INTRAMUSCULAR | Status: DC | PRN
  Administered 2016-09-14 (×4): 25 ug via INTRAVENOUS

## 2016-09-14 MED ORDER — FENTANYL CITRATE (PF) 100 MCG/2ML IJ SOLN
25.0000 ug | INTRAMUSCULAR | Status: DC | PRN
  Administered 2016-09-14 (×2): 50 ug via INTRAVENOUS

## 2016-09-14 MED ORDER — SODIUM CHLORIDE 0.9 % IV SOLN
INTRAVENOUS | Status: DC
  Administered 2016-09-14: 15:00:00 via INTRAVENOUS

## 2016-09-14 MED ORDER — FENTANYL CITRATE (PF) 100 MCG/2ML IJ SOLN
INTRAMUSCULAR | Status: AC
  Filled 2016-09-14: qty 2

## 2016-09-14 MED ORDER — ONDANSETRON HCL 4 MG/2ML IJ SOLN: 4.0000 mg | Freq: Once | INTRAMUSCULAR | Status: DC | PRN

## 2016-09-14 MED ORDER — ONDANSETRON HCL 4 MG/2ML IJ SOLN
INTRAMUSCULAR | Status: AC
  Filled 2016-09-14: qty 2

## 2016-09-14 MED ORDER — LACTATED RINGERS IV SOLN: INTRAVENOUS | Status: DC

## 2016-09-14 MED ORDER — CHLORHEXIDINE GLUCONATE 4 % EX LIQD: 60.0000 mL | Freq: Once | CUTANEOUS | Status: DC

## 2016-09-14 MED ORDER — PHENYLEPHRINE HCL 10 MG/ML IJ SOLN
INTRAMUSCULAR | Status: DC | PRN
  Administered 2016-09-14: 100 ug via INTRAVENOUS

## 2016-09-14 MED ORDER — BUPIVACAINE HCL (PF) 0.25 % IJ SOLN
INTRAMUSCULAR | Status: DC | PRN
  Administered 2016-09-14: 6 mL

## 2016-09-14 MED ORDER — LIDOCAINE 2% (20 MG/ML) 5 ML SYRINGE
INTRAMUSCULAR | Status: AC
  Filled 2016-09-14: qty 5

## 2016-09-14 MED ORDER — MIDAZOLAM HCL 2 MG/2ML IJ SOLN
INTRAMUSCULAR | Status: AC
  Filled 2016-09-14: qty 2

## 2016-09-14 MED ORDER — HYDROCODONE-ACETAMINOPHEN 5-325 MG PO TABS: 1.0000 | ORAL_TABLET | ORAL | 0 refills | Status: DC | PRN

## 2016-09-14 MED ORDER — PHENYLEPHRINE 40 MCG/ML (10ML) SYRINGE FOR IV PUSH (FOR BLOOD PRESSURE SUPPORT)
PREFILLED_SYRINGE | INTRAVENOUS | Status: AC
  Filled 2016-09-14: qty 10

## 2016-09-14 MED ORDER — PROPOFOL 10 MG/ML IV BOLUS
INTRAVENOUS | Status: AC
  Filled 2016-09-14: qty 20

## 2016-09-14 MED ORDER — BUPIVACAINE HCL (PF) 0.25 % IJ SOLN
INTRAMUSCULAR | Status: AC
  Filled 2016-09-14: qty 30

## 2016-09-14 MED ORDER — MIDAZOLAM HCL 5 MG/5ML IJ SOLN
INTRAMUSCULAR | Status: DC | PRN
  Administered 2016-09-14 (×2): 1 mg via INTRAVENOUS

## 2016-09-14 MED ORDER — LIDOCAINE HCL (CARDIAC) 20 MG/ML IV SOLN
INTRAVENOUS | Status: DC | PRN
  Administered 2016-09-14: 60 mg via INTRAVENOUS

## 2016-09-14 SURGICAL SUPPLY — 27 items
BANDAGE ELASTIC 2 LF NS (GAUZE/BANDAGES/DRESSINGS) ×2 IMPLANT
BLADE SURG 15 STRL LF DISP TIS (BLADE) ×1 IMPLANT
BLADE SURG 15 STRL SS (BLADE) ×1
BNDG ESMARK 4X12 TAN STRL LF (GAUZE/BANDAGES/DRESSINGS) ×2 IMPLANT
CANISTER SUCT 1200ML W/VALVE (MISCELLANEOUS) ×2 IMPLANT
CUFF TOURN 18 STER (MISCELLANEOUS) ×2 IMPLANT
DURAPREP 26ML APPLICATOR (WOUND CARE) ×2 IMPLANT
ELECT CAUTERY NEEDLE TIP 1.0 (MISCELLANEOUS) ×2
ELECT REM PT RETURN 9FT ADLT (ELECTROSURGICAL) ×2
ELECTRODE CAUTERY NEDL TIP 1.0 (MISCELLANEOUS) ×1 IMPLANT
ELECTRODE REM PT RTRN 9FT ADLT (ELECTROSURGICAL) ×1 IMPLANT
GAUZE SPONGE 4X4 12PLY STRL (GAUZE/BANDAGES/DRESSINGS) ×2 IMPLANT
GAUZE STRETCH 2X75IN STRL (MISCELLANEOUS) ×2 IMPLANT
GLOVE BIO SURGEON STRL SZ8 (GLOVE) ×2 IMPLANT
GLOVE BIOGEL M STRL SZ7.5 (GLOVE) ×2 IMPLANT
GOWN STRL REUS W/ TWL LRG LVL3 (GOWN DISPOSABLE) ×2 IMPLANT
GOWN STRL REUS W/TWL LRG LVL3 (GOWN DISPOSABLE) ×2
KIT RM TURNOVER STRD PROC AR (KITS) ×2 IMPLANT
NEEDLE HYPO 25X1 1.5 SAFETY (NEEDLE) ×2 IMPLANT
NS IRRIG 500ML POUR BTL (IV SOLUTION) ×2 IMPLANT
PACK EXTREMITY ARMC (MISCELLANEOUS) ×2 IMPLANT
PAD CAST CTTN 4X4 STRL (SOFTGOODS) ×1 IMPLANT
PADDING CAST COTTON 4X4 STRL (SOFTGOODS) ×1
SPLINT CAST 1 STEP 3X12 (MISCELLANEOUS) ×2 IMPLANT
STOCKINETTE STRL 4IN 9604848 (GAUZE/BANDAGES/DRESSINGS) ×2 IMPLANT
SUT ETHILON 5-0 FS-2 18 BLK (SUTURE) ×2 IMPLANT
SYRINGE 10CC LL (SYRINGE) ×2 IMPLANT

## 2016-09-14 NOTE — Anesthesia Preprocedure Evaluation (Signed)
Anesthesia Evaluation  Patient identified by MRN, date of birth, ID band Patient awake    Reviewed: Allergy & Precautions, H&P , NPO status , Patient's Chart, lab work & pertinent test results  History of Anesthesia Complications Negative for: history of anesthetic complications  Airway Mallampati: III  TM Distance: >3 FB Neck ROM: limited    Dental  (+) Poor Dentition, Missing, Chipped, Edentulous Upper, Upper Dentures   Pulmonary shortness of breath and with exertion, neg sleep apnea, neg COPD, neg recent URI,    Pulmonary exam normal breath sounds clear to auscultation       Cardiovascular Exercise Tolerance: Poor hypertension, (-) angina+CHF and + DOE  (-) CAD, (-) Past MI, (-) Cardiac Stents and (-) CABG Normal cardiovascular exam+ dysrhythmias Atrial Fibrillation + pacemaker (-) Valvular Problems/Murmurs Rhythm:regular Rate:Normal     Neuro/Psych TIAnegative psych ROS   GI/Hepatic negative GI ROS, Neg liver ROS, GERD  Controlled,  Endo/Other  diabetes, Poorly Controlled, Type 2, Insulin Dependent  Renal/GU ESRFRenal disease  negative genitourinary   Musculoskeletal   Abdominal   Peds  Hematology negative hematology ROS (+)   Anesthesia Other Findings Past Medical History:   Diabetes mellitus without complication (HCC)                 Hypertension                                                 Morbid obesity (HCC)                                         Chronic kidney disease (CKD) stage G3a/A1, mod*              Paroxysmal atrial fibrillation (HCC)                        Past Surgical History:   SHOULDER ARTHROSCOPY WITH SUBACROMIAL DECOMPRE* Left              ESOPHAGOGASTRODUODENOSCOPY                      N/A 01/27/2015      Comment:Procedure: ESOPHAGOGASTRODUODENOSCOPY (EGD);                Surgeon: Lollie Sails, MD;  Location: [facility]              ENDOSCOPY;  Service: Endoscopy;  Laterality:         N/A;   COLON SURGERY                                                BMI    Body Mass Index   44.63 kg/m 2    Reproductive/Obstetrics negative OB ROS                             Anesthesia Physical  Anesthesia Plan  ASA: IV  Anesthesia Plan: General   Post-op Pain Management:    Induction:   Airway Management Planned:   Additional Equipment:   Intra-op  Plan:   Post-operative Plan:   Informed Consent: I have reviewed the patients History and Physical, chart, labs and discussed the procedure including the risks, benefits and alternatives for the proposed anesthesia with the patient or authorized representative who has indicated his/her understanding and acceptance.   Dental Advisory Given  Plan Discussed with: Anesthesiologist, CRNA and Surgeon  Anesthesia Plan Comments:         Anesthesia Quick Evaluation

## 2016-09-14 NOTE — Discharge Instructions (Signed)
°  Instructions after Hand / Wrist Surgery ° ° James P. Hooten, Jr., M.D. ° Dept. of Orthopaedics & Sports Medicine ° [facility] ° [address] ° [place], [place]  [address] ° ° Phone: [phone]   Fax: [phone] ° ° °DIET: °• Drink plenty of non-alcoholic fluids & begin a light diet. °• Resume your normal diet the day after surgery. ° °ACTIVITY:  °• Keep the hand elevated above the level of the elbow. °• Begin gently moving the fingers on a regular basis to avoid stiffness. °• Avoid any heavy lifting, pushing, or pulling with the operative hand. °• Do not drive or operate any equipment until instructed. ° °WOUND CARE:  °• Keep the splint/bandage clean and dry.  °• The splint and stitches will be removed in the office. °• Continue to use the ice packs periodically to reduce pain and swelling. °• You may bathe or shower after the stitches are removed at the first office visit following surgery. ° °MEDICATIONS: °• You may resume your regular medications. °• Please take the pain medication as prescribed. °• Do not take pain medication on an empty stomach. °• Do not drive or drink alcoholic beverages when taking pain medications. ° °CALL THE OFFICE FOR: °• Temperature above 101 degrees °• Excessive bleeding or drainage on the dressing. °• Excessive swelling, coldness, or paleness of the fingers. °• Persistent nausea and vomiting. ° °FOLLOW-UP:  °• You should have an appointment to return to the office in 7-10 days after surgery.  ° °REMEMBER: R.I.C.E. = Rest, Ice, Compression, Elevation !  ° ° ° °AMBULATORY SURGERY  °DISCHARGE INSTRUCTIONS ° ° °1) The drugs that you were given will stay in your system until tomorrow so for the next 24 hours you should not: ° °A) Drive an automobile °B) Make any legal decisions °C) Drink any alcoholic beverage ° ° °2) You may resume regular meals tomorrow.  Today it is better to start with liquids and gradually work up to solid foods. ° °You may eat anything you prefer, but  it is better to start with liquids, then soup and crackers, and gradually work up to solid foods. ° ° °3) Please notify your doctor immediately if you have any unusual bleeding, trouble breathing, redness and pain at the surgery site, drainage, fever, or pain not relieved by medication. ° ° ° °4) Additional Instructions: ° ° ° ° ° ° ° °Please contact your physician with any problems or Same Day Surgery at [phone], Monday through Friday 6 am to 4 pm, or [place] [facility] [place] [facility] number at [phone]. °

## 2016-09-14 NOTE — Anesthesia Procedure Notes (Signed)
Procedure Name: LMA Insertion Date/Time: 09/14/2016 3:29 PM Performed by: Dionne Bucy Pre-anesthesia Checklist: Patient identified, Patient being monitored, Timeout performed, Emergency Drugs available and Suction available Patient Re-evaluated:Patient Re-evaluated prior to inductionOxygen Delivery Method: Circle system utilized Preoxygenation: Pre-oxygenation with 100% oxygen Intubation Type: IV induction Ventilation: Mask ventilation without difficulty LMA: LMA inserted LMA Size: 4.0 Tube type: Oral Number of attempts: 1 Placement Confirmation: positive ETCO2 and breath sounds checked- equal and bilateral Tube secured with: Tape Dental Injury: Teeth and Oropharynx as per pre-operative assessment

## 2016-09-14 NOTE — OR Nursing (Signed)
Verified consent with Dr Marry Guan and patient.

## 2016-09-14 NOTE — Brief Op Note (Signed)
09/14/2016  4:54 PM  PATIENT:  Deborah Obrien  76 y.o. female  PRE-OPERATIVE DIAGNOSIS:  DEQUERVAINS DISEASE  POST-OPERATIVE DIAGNOSIS:  DEQUERVAINS DISEASE  PROCEDURE:  Procedure(s): RELEASE DORSAL COMPARTMENT (DEQUERVAIN) (Left)  SURGEON:  Surgeon(s) and Role:    * Dereck Leep, MD - Primary  ASSISTANTS: none   ANESTHESIA:   general  EBL:  Total I/O In: 500 [I.V.:500] Out: 5 [Blood:5]  BLOOD ADMINISTERED:none  DRAINS: none   LOCAL MEDICATIONS USED:  MARCAINE     SPECIMEN:  No Specimen  DISPOSITION OF SPECIMEN:  N/A  COUNTS:  YES  TOURNIQUET:  29 minutes  DICTATION: .Dragon Dictation  PLAN OF CARE: Discharge to home after PACU  PATIENT DISPOSITION:  PACU - hemodynamically stable.   Delay start of Pharmacological VTE agent (>24hrs) due to surgical blood loss or risk of bleeding: not applicable

## 2016-09-14 NOTE — Op Note (Signed)
OPERATIVE NOTE  DATE OF SURGERY:  09/14/2016  PATIENT NAME:  [name]   DOB: 10-28-1940  MRN: [id number]  PRE-OPERATIVE DIAGNOSIS: Left DeQuervain's stenosing tenosynovitis  POST-OPERATIVE DIAGNOSIS:  Same  PROCEDURE:  Left DeQuervain's release  SURGEON:  Marciano Sequin. M.D.  ANESTHESIA: general  ESTIMATED BLOOD LOSS: 5 mL  FLUIDS REPLACED: 500 mL of crystalloid  TOURNIQUET TIME: 29 minutes  DRAINS: None  INDICATIONS FOR SURGERY: Deborah Obrien is a 76 y.o. year old female with a  history of pain over the first dorsal compartment of the left wrist. The patient had not seen any significant improvement despite conservative nonsurgical intervention. After discussion of the risks and benefits of surgical intervention, the patient expressed understanding of the risks benefits and agree with plans for release of the first dorsal compartment of the wrist (DeQuervain's release).   PROCEDURE IN DETAIL: The patient was brought into the operating room and after adequate general anesthesia, a tourniquet was placed on the patient's left upper arm.The left hand and arm were prepped with alcohol and Duraprep and draped in the usual sterile fashion. A "time-out" was performed as per usual protocol. The hand and forearm were exsanguinated using an Esmarch and the tourniquet was inflated to 250 mmHg. Loupe magnification was used throughout the procedure. An incision was made over the first dorsal compartment approximately 0.5 centimeters proximal to the tip of the radial styloid. Care was taken to identify and protect the radial sensory branches. Dissection was carried down to the annular ligament. The annular ligament was sharply incised and there was noted to be some thickening and inflammatory changes to the tenosynovium which was subsequently debrided. The extensor pollicis brevis and abductor pollicis longus were elevated out of the wound with a hook to document complete decompression.  Free and independent movement of the tendons was noticed by passively moving the thumb. The tourniquet was deflated after total tourniquet time of 29 minutes. Hemostasis was achieved using electrocautery. The skin was then re-approximated with interrupted sutures of #5-0 nylon. A sterile compression dressing was applied   The patient tolerated the procedure well and was transported to the PACU in stable condition.  Indy Kuck P. Holley Bouche., M.D.

## 2016-09-14 NOTE — H&P (Signed)
The patient has been re-examined, and the chart reviewed, and there have been no interval changes to the documented history and physical.    The risks, benefits, and alternatives have been discussed at length. The patient expressed understanding of the risks benefits and agreed with plans for surgical intervention.  James P. Hooten, Jr. M.D.    

## 2016-09-14 NOTE — OR Nursing (Signed)
Anesthesia notified with result of blood sugar. No new orders.

## 2016-09-14 NOTE — Transfer of Care (Signed)
Immediate Anesthesia Transfer of Care Note  Patient: Deborah Obrien  Procedure(s) Performed: Procedure(s): RELEASE DORSAL COMPARTMENT (DEQUERVAIN) (Left)  Patient Location: PACU  Anesthesia Type:General  Level of Consciousness: awake and patient cooperative  Airway & Oxygen Therapy: Patient Spontanous Breathing and Patient connected to nasal cannula oxygen  Post-op Assessment: Report given to RN and Post -op Vital signs reviewed and stable  Post vital signs: Reviewed and stable  Last Vitals:  Vitals:   09/14/16 1421  BP: 100/71  Pulse: 92  Resp: 18  Temp: 36.6 C    Last Pain:  Vitals:   09/14/16 1421  TempSrc: Oral  PainSc: 4          Complications: No apparent anesthesia complications

## 2016-09-15 ENCOUNTER — Encounter: Payer: Self-pay | Admitting: Orthopedic Surgery

## 2016-09-20 NOTE — Anesthesia Postprocedure Evaluation (Signed)
Anesthesia Post Note  Patient: Deborah Obrien  Procedure(s) Performed: Procedure(s) (LRB): RELEASE DORSAL COMPARTMENT (DEQUERVAIN) (Left)  Patient location during evaluation: PACU Anesthesia Type: General Level of consciousness: awake and alert Pain management: pain level controlled Vital Signs Assessment: post-procedure vital signs reviewed and stable Respiratory status: spontaneous breathing, nonlabored ventilation, respiratory function stable and patient connected to nasal cannula oxygen Cardiovascular status: blood pressure returned to baseline and stable Postop Assessment: no signs of nausea or vomiting Anesthetic complications: no     Last Vitals:  Vitals:   09/14/16 1739 09/14/16 1758  BP: 116/67 (!) 123/55  Pulse: 60 65  Resp: 18 16  Temp: 36.3 C     Last Pain:  Vitals:   09/15/16 1048  TempSrc:   PainSc: 0-No pain                 Molli Barrows

## 2016-11-02 DIAGNOSIS — D509 Iron deficiency anemia, unspecified: Secondary | ICD-10-CM | POA: Insufficient documentation

## 2016-11-02 DIAGNOSIS — N183 Chronic kidney disease, stage 3 unspecified: Secondary | ICD-10-CM | POA: Insufficient documentation

## 2017-01-09 ENCOUNTER — Ambulatory Visit (INDEPENDENT_AMBULATORY_CARE_PROVIDER_SITE_OTHER): Payer: Medicare Other | Admitting: Podiatry

## 2017-01-09 ENCOUNTER — Encounter: Payer: Self-pay | Admitting: Podiatry

## 2017-01-09 ENCOUNTER — Ambulatory Visit: Payer: Medicare Other

## 2017-01-09 DIAGNOSIS — N289 Disorder of kidney and ureter, unspecified: Secondary | ICD-10-CM

## 2017-01-09 DIAGNOSIS — E1142 Type 2 diabetes mellitus with diabetic polyneuropathy: Secondary | ICD-10-CM | POA: Diagnosis not present

## 2017-01-09 DIAGNOSIS — N184 Chronic kidney disease, stage 4 (severe): Secondary | ICD-10-CM | POA: Insufficient documentation

## 2017-01-09 DIAGNOSIS — E119 Type 2 diabetes mellitus without complications: Secondary | ICD-10-CM | POA: Diagnosis not present

## 2017-01-09 DIAGNOSIS — N189 Chronic kidney disease, unspecified: Secondary | ICD-10-CM | POA: Insufficient documentation

## 2017-01-09 DIAGNOSIS — E1122 Type 2 diabetes mellitus with diabetic chronic kidney disease: Secondary | ICD-10-CM | POA: Insufficient documentation

## 2017-01-09 DIAGNOSIS — K760 Fatty (change of) liver, not elsewhere classified: Secondary | ICD-10-CM | POA: Insufficient documentation

## 2017-01-09 NOTE — Progress Notes (Signed)
She presents today as a new patient with a history of non-insulin dependent diabetes mellitus with a history of neuropathy that has resolved to some degree over time. She sees Dr. Elvina Mattes for routine podiatric care will see [place] for her diabetic shoes.  Objective: Vital signs are stable alert and oriented 3 have reviewed her past medical history medications allergy surgery social history and review of systems. Pulses are strongly palpable. Neurologic sensorium is intact. She has slight decrease in loss of protective sensation plantarly. Otherwise she has no open lesions or wounds. She does have reactive hyperkeratosis distal aspect of the toes where she has Leksell hammertoe deformities.  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  Plan: She was measured today for diabetic shoes will follow up with [place] once those come in.

## 2017-01-12 ENCOUNTER — Other Ambulatory Visit: Payer: Self-pay | Admitting: Gastroenterology

## 2017-01-12 DIAGNOSIS — D500 Iron deficiency anemia secondary to blood loss (chronic): Secondary | ICD-10-CM

## 2017-01-12 DIAGNOSIS — R195 Other fecal abnormalities: Secondary | ICD-10-CM

## 2017-01-16 ENCOUNTER — Ambulatory Visit
Admission: RE | Admit: 2017-01-16 | Discharge: 2017-01-16 | Disposition: A | Discharge status: Home / Self Care | Payer: Medicare Other | Source: Ambulatory Visit | Attending: Gastroenterology | Admitting: Gastroenterology

## 2017-01-16 DIAGNOSIS — R195 Other fecal abnormalities: Secondary | ICD-10-CM

## 2017-01-16 DIAGNOSIS — D500 Iron deficiency anemia secondary to blood loss (chronic): Secondary | ICD-10-CM

## 2017-01-17 ENCOUNTER — Ambulatory Visit: Payer: Medicare Other

## 2017-01-18 ENCOUNTER — Other Ambulatory Visit: Payer: Self-pay | Admitting: Internal Medicine

## 2017-01-18 ENCOUNTER — Encounter: Payer: Self-pay | Admitting: General Surgery

## 2017-01-18 DIAGNOSIS — N644 Mastodynia: Secondary | ICD-10-CM

## 2017-01-30 ENCOUNTER — Ambulatory Visit
Admission: RE | Admit: 2017-01-30 | Discharge: 2017-01-30 | Disposition: A | Discharge status: Home / Self Care | Payer: Medicare Other | Source: Ambulatory Visit | Attending: Internal Medicine | Admitting: Internal Medicine

## 2017-01-30 DIAGNOSIS — N644 Mastodynia: Secondary | ICD-10-CM | POA: Insufficient documentation

## 2017-02-14 ENCOUNTER — Ambulatory Visit (INDEPENDENT_AMBULATORY_CARE_PROVIDER_SITE_OTHER): Payer: Medicare Other | Admitting: General Surgery

## 2017-02-14 ENCOUNTER — Encounter: Payer: Self-pay | Admitting: General Surgery

## 2017-02-14 VITALS — BP 132/70 | HR 68 | Resp 14 | Ht 64.0 in | Wt 244.0 lb

## 2017-02-14 DIAGNOSIS — N644 Mastodynia: Secondary | ICD-10-CM | POA: Diagnosis not present

## 2017-02-14 NOTE — Progress Notes (Signed)
Patient ID: Deborah Obrien, female   DOB: 04/03/1941, 76 y.o.   MRN: [id number]  Chief Complaint  Patient presents with  . Other    left breast pain    HPI TENEA SENS is a 76 y.o. female who presents for a breast evaluation. The most recent left breast  mammogram was done on 01/30/2017. Due for a rigth breast mammogram in July 2018.Patient states she has been having left breast pain for over a month ago. She states the pain is sharpe and burning pain. Patient states she noticed some lumps in her left breast last year. A year ago she was having a milking discharge from left breast saw Dr. Tamala Julian.  Patient does perform regular self breast checks and gets regular mammograms done.    Husband, [name] is present at visit.   HPI  Past Medical History:  Diagnosis Date  . A-fib ([place]) 2016  . Acid reflux   . Anxiety   . Bowel obstruction ([place])   . CHF (congestive heart failure) ([place]) 2017  . Chronic kidney disease (CKD) stage G3a/A1, moderately decreased glomerular filtration rate (GFR) between 45-59 mL/min/1.73 square meter and albuminuria creatinine ratio less than 30 mg/g   . Diabetes mellitus without complication ([place])   . Dyspnea   . Fatty liver   . GI bleed   . Gout   . Hypertension   . Morbid obesity ([place])   . Osteoarthritis   . Osteoporosis   . Paroxysmal atrial fibrillation (HCC)   . Presence of permanent cardiac pacemaker   . TIA (transient ischemic attack)     Past Surgical History:  Procedure Laterality Date  . ABDOMINAL HYSTERECTOMY  1980  . APPENDECTOMY    . BACK SURGERY     2008  . BREAST BIOPSY Right 08/16   stereo fibroadenomatous change, neg for atypia  . BREAST EXCISIONAL BIOPSY Left    neg  . CARDIAC CATHETERIZATION Left 08/25/2016   Procedure: Left Heart Cath and Coronary Angiography;  Surgeon: Corey Skains, MD;  Location: [facility];  Service: Cardiovascular;  Laterality: Left;  . CATARACT EXTRACTION, BILATERAL  2012  . CHOLECYSTECTOMY     . colon blockage  1999  . COLON SURGERY  1999  . COLONOSCOPY  2016   polyps removed 2016  . DORSAL COMPARTMENT RELEASE Left 09/14/2016   Procedure: RELEASE DORSAL COMPARTMENT (DEQUERVAIN);  Surgeon: Dereck Leep, MD;  Location: [facility];  Service: Orthopedics;  Laterality: Left;  . ESOPHAGOGASTRODUODENOSCOPY N/A 01/27/2015   Procedure: ESOPHAGOGASTRODUODENOSCOPY (EGD);  Surgeon: Lollie Sails, MD;  Location: [facility];  Service: Endoscopy;  Laterality: N/A;  . ESOPHAGOGASTRODUODENOSCOPY (EGD) WITH PROPOFOL N/A 07/24/2015   Procedure: ESOPHAGOGASTRODUODENOSCOPY (EGD) WITH PROPOFOL;  Surgeon: Lucilla Lame, MD;  Location: [facility];  Service: Endoscopy;  Laterality: N/A;  . JOINT REPLACEMENT  2014   Bilateral Knee replacement  . PACEMAKER INSERTION Left 07/01/2015   Procedure: INSERTION PACEMAKER;  Surgeon: Isaias Cowman, MD;  Location: [facility];  Service: Cardiovascular;  Laterality: Left;  . REPLACEMENT TOTAL KNEE BILATERAL    . SHOULDER ARTHROSCOPY WITH SUBACROMIAL DECOMPRESSION Left 2013    Family History  Problem Relation Age of Onset  . Diabetes Mellitus II Mother   . CAD Mother   . Heart attack Mother   . Cancer Father        skin  . Breast cancer Neg Hx     Social History Social History  Substance Use Topics  . Smoking status: Never Smoker  .  Smokeless tobacco: Never Used  . Alcohol use No    Allergies  Allergen Reactions  . Cephalosporins Other (See Comments)    Reaction:  Unknown   . Hydrocodone-Acetaminophen Hives  . Macrolides And Ketolides Other (See Comments)    Reaction:  Unknown   . Meperidine Shortness Of Breath, Nausea Only and Other (See Comments)    Reaction:  Stomach pain   . Prednisone Anaphylaxis and Other (See Comments)    Reaction:  Increases pts blood sugar  Pt states that she is allergic to all steroids.    . Shellfish Allergy Shortness Of Breath, Diarrhea, Nausea Only, Rash and Other (See Comments)    Reaction:  Stomach  pain   . Sulfa Antibiotics Shortness Of Breath, Diarrhea, Nausea Only and Other (See Comments)    Reaction:  Stomach pain   . Uloric [Febuxostat] Anaphylaxis    Locks pt's body up Locks pt's body up  . Aspirin Other (See Comments)    Reaction:  Stomach pain   . Celecoxib Other (See Comments)    Reaction:  GI bleed, weakness, and stomach pain.    . Cephalexin Diarrhea, Nausea Only and Other (See Comments)    Reaction:  Stomach pain   . Erythromycin Diarrhea, Nausea Only and Other (See Comments)    Reaction:  Stomach pain   . Hydromorphone Other (See Comments)    Reaction:  Hypotension   . Iodinated Diagnostic Agents Rash and Other (See Comments)    Pt states that she is unable to have because she has chronic kidney disease.    Marland Kitchen Oxycodone Other (See Comments)    Reaction:  Stomach pain   . Atorvastatin Nausea Only and Other (See Comments)    Reaction:  Weakness   . Codeine Diarrhea, Nausea Only and Other (See Comments)    Reaction:  Stomach pain   . Tape Other (See Comments)    Reaction:  Causes pts skin to tear  Pt states that she is able to use paper tape.    . Valdecoxib Swelling and Other (See Comments)    Pt states that her hands and feet swell.    . Iodine Rash    Current Outpatient Prescriptions  Medication Sig Dispense Refill  . acetaminophen (TYLENOL) 500 MG tablet Take 500-1,000 mg by mouth every 6 (six) hours as needed for mild pain or moderate pain.    Marland Kitchen allopurinol (ZYLOPRIM) 100 MG tablet Take 150 mg by mouth daily.    Marland Kitchen amiodarone (PACERONE) 100 MG tablet Take 100 mg by mouth daily.    . APPLE CIDER VINEGAR PO Take 1 tablet by mouth 2 (two) times daily.    Marland Kitchen aspirin EC 81 MG tablet Take 81 mg by mouth at bedtime.    . busPIRone (BUSPAR) 10 MG tablet Take 10 mg by mouth 2 (two) times daily.    . calcitRIOL (ROCALTROL) 0.25 MCG capsule     . colchicine 0.6 MG tablet Take 0.6 mg by mouth daily as needed (gout).     . Cranberry 500 MG CAPS Take 500 mg by mouth  daily.     Marland Kitchen docusate sodium (COLACE) 100 MG capsule Take 100 mg by mouth at bedtime.     . furosemide (LASIX) 20 MG tablet Take 20 mg by mouth every other day.    Marland Kitchen HYDROcodone-acetaminophen (NORCO) 5-325 MG tablet Take 1-2 tablets by mouth every 4 (four) hours as needed for moderate pain. 40 tablet 0  . hydrocortisone 2.5 % cream Apply  1 application topically at bedtime as needed (rash).     . Hypromellose (ARTIFICIAL TEARS OP) Apply 1 drop to eye daily as needed (dry eyes).    . insulin NPH-regular Human (NOVOLIN 70/30) (70-30) 100 UNIT/ML injection Inject 22 Units into the skin 2 (two) times daily before a meal.     . ketoconazole (NIZORAL) 2 % cream Apply 1 application topically every 2 (two) hours as needed (for rash under breasts).     . metoprolol tartrate (LOPRESSOR) 25 MG tablet Take 12.5 mg by mouth 2 (two) times daily.    . Misc Natural Products (TART CHERRY ADVANCED PO) Take 1 capsule by mouth daily.    . mupirocin cream (BACTROBAN) 2 % Apply 1 application topically daily as needed (rash).     Marland Kitchen oxybutynin (DITROPAN) 5 MG tablet Take 10 tablets by mouth at bedtime.     . pantoprazole (PROTONIX) 40 MG tablet Take 40 mg by mouth 2 (two) times daily.    Marland Kitchen PARoxetine (PAXIL) 20 MG tablet Take 20 mg by mouth daily.    . pravastatin (PRAVACHOL) 20 MG tablet Take 20 mg by mouth at bedtime.     . talc (ZEASORB) powder Apply 1 application topically as needed (for rash under breasts).    . traMADol (ULTRAM) 50 MG tablet     . traZODone (DESYREL) 50 MG tablet Take 100 tablets by mouth at bedtime.      No current facility-administered medications for this visit.     Review of Systems Review of Systems  Constitutional: Negative.   Respiratory: Negative.   Cardiovascular: Negative.     Blood pressure 132/70, pulse 68, resp. rate 14, height 5\' 4"  (1.626 m), weight 244 lb (110.7 kg).  Physical Exam Physical Exam  Constitutional: She is oriented to person, place, and time. She appears  well-developed and well-nourished.  Eyes: Conjunctivae are normal. No scleral icterus.  Neck: Neck supple.  Cardiovascular: Normal rate, regular rhythm and normal heart sounds.   Pulmonary/Chest: Effort normal and breath sounds normal. Right breast exhibits no inverted nipple, no mass, no nipple discharge, no skin change and no tenderness. Left breast exhibits mass and tenderness. Left breast exhibits no inverted nipple, no nipple discharge and no skin change.    Lymphadenopathy:    She has no cervical adenopathy.  Neurological: She is alert and oriented to person, place, and time.  Skin: Skin is warm and dry.    Data Reviewed Bilateral screening mammograms dated 04/11/2016, ultrasound of the same date for reported palpable thickening at the 4:00 position as well as left diagnostic mammograms of 01/30/2017 reviewed.  No mammographic or ultrasound abnormality identified. BI-RADS-1.  Laboratory studies dated 01/11/2017 showed a creatinine of 1.8 with an estimated GFR of 27. Minimal change over the last 24 months.  Cardiac catheterization dated 08/25/2016 showed minimal stenosis, normal left ventricular function, 2+ mitral regurgitation. Study completed because of [place] class 3 anginal symptoms.  Assessment    Mastalgia involving the lateral aspect of the left breast without clinical, mammographic or ultrasound abnormality.  Mild muscular tenderness laterally on the left chest wall.    Plan    The patient appears to be more comfortable with good support. Typically, anti-inflammatories would be prescribed but with her renal function this would not be appropriate. She's been encouraged to make use of topical cream such as BenGay, capsaicin patches as well as local heat for comfort.    HPI, Physical Exam, Assessment and Plan have been scribed under the  direction and in the presence of Hervey Ard, MD.  Gaspar Cola, CMA  I have completed the exam and reviewed the above  documentation for accuracy and completeness.  I agree with the above.  Haematologist has been used and any errors in dictation or transcription are unintentional.  Hervey Ard, M.D., F.A.C.S.  Robert Bellow 02/14/2017, 6:49 PM

## 2017-03-02 ENCOUNTER — Ambulatory Visit (INDEPENDENT_AMBULATORY_CARE_PROVIDER_SITE_OTHER): Payer: Medicare Other | Admitting: Podiatry

## 2017-03-02 DIAGNOSIS — E1142 Type 2 diabetes mellitus with diabetic polyneuropathy: Secondary | ICD-10-CM

## 2017-03-02 NOTE — Progress Notes (Signed)
This patient presents to the office to pick up her diabetic shoes.  GENERAL APPEARANCE: Alert, conversant. Appropriately groomed. No acute distress.  VASCULAR: Pedal pulses are  palpable at  [facility] and PT bilateral.  Capillary refill time is immediate to all digits,  Normal temperature gradient.  Digital hair growth is present bilateral  NEUROLOGIC: sensation is diminished  to 5.07 monofilament at 5/5 sites bilateral.  Light touch is intact bilateral, Muscle strength normal.  MUSCULOSKELETAL: acceptable muscle strength, tone and stability bilateral.  Hammer toess B/l  DERMATOLOGIC: skin color, texture, and turgor are within normal limits.  No preulcerative lesions or ulcers  are seen, no interdigital maceration noted.  No open lesions present.  Digital nails are asymptomatic. No drainage noted.   Diabetic neuropathy  Hammer toe  ROV  Dispense diabetic shoes.  Patient presents today and was dispensed 0ne pair ( two units) of medically necessary extra depth shoes with three pair( six units) of custom molded multiple density inserts. The shoes and the inserts are fitted to the patients ' feet and are noted to fit well and are free of defect.  Length and width of the shoes are also acceptable.  Patient was given written and verbal  instructions for wearing.  If any concerns arrive with the shoes or inserts, the patient is to call the office.Patient is to follow up with doctor in six weeks.   Gardiner Barefoot DPM

## 2017-03-03 ENCOUNTER — Other Ambulatory Visit: Payer: Self-pay | Admitting: Internal Medicine

## 2017-03-03 ENCOUNTER — Telehealth: Payer: Self-pay | Admitting: *Deleted

## 2017-03-03 DIAGNOSIS — Z1231 Encounter for screening mammogram for malignant neoplasm of breast: Secondary | ICD-10-CM

## 2017-03-03 NOTE — Telephone Encounter (Signed)
Spoke with Patty at [facility] and informed her that [employer] will be faxing paperwork for Dr. Ouida Sills to sign.  She will look for paperwork and get it back to [employer] as soon as she  can

## 2017-03-03 NOTE — Telephone Encounter (Signed)
[facility] states pt came in to get her diabetic shoes and was told that our office did not have the proper paperwork for the diabetic shoes to be ordered. Please contact [facility] to discuss.

## 2017-03-16 ENCOUNTER — Ambulatory Visit (INDEPENDENT_AMBULATORY_CARE_PROVIDER_SITE_OTHER): Payer: Medicare Other | Admitting: Podiatry

## 2017-03-16 DIAGNOSIS — M2042 Other hammer toe(s) (acquired), left foot: Secondary | ICD-10-CM

## 2017-03-16 DIAGNOSIS — E1142 Type 2 diabetes mellitus with diabetic polyneuropathy: Secondary | ICD-10-CM | POA: Diagnosis not present

## 2017-03-16 DIAGNOSIS — M2041 Other hammer toe(s) (acquired), right foot: Secondary | ICD-10-CM

## 2017-03-16 NOTE — Patient Instructions (Signed)

## 2017-03-16 NOTE — Progress Notes (Signed)
Patient ID: Deborah Obrien, female   DOB: 1941/06/03, 76 y.o.   MRN: [id number] Patient presents for diabetic shoe pick up, shoes are tried on for good fit.  Patient received 1 Pair and 3 pairs custom molded diabetic inserts.  Verbal and written break in and wear instructions given.  Patient will follow up for scheduled routine care.   This patient presents the office to pick up her new diabetic shoes.  Diagnosis  diabetes with neuropathy  Hammer toes, bilateral  Tx.  dispensing diabetic shoes.  Patient presents today and was dispensed 0ne pair ( two units) of medically necessary extra depth shoes with three pair( six units) of custom molded multiple density inserts. The shoes and the inserts are fitted to the patients ' feet and are noted to fit well and are free of defect.  Length and width of the shoes are also acceptable.  Patient was given written and verbal  instructions for wearing.  If any concerns arrive with the shoes or inserts, the patient is to call the office.Patient is to follow up with doctor in six weeks.   Gardiner Barefoot DPM

## 2017-04-17 ENCOUNTER — Ambulatory Visit
Admission: RE | Admit: 2017-04-17 | Discharge: 2017-04-17 | Disposition: A | Discharge status: Home / Self Care | Payer: Medicare Other | Source: Ambulatory Visit | Attending: Internal Medicine | Admitting: Internal Medicine

## 2017-04-17 DIAGNOSIS — Z1231 Encounter for screening mammogram for malignant neoplasm of breast: Secondary | ICD-10-CM | POA: Diagnosis present

## 2017-05-09 DIAGNOSIS — N2581 Secondary hyperparathyroidism of renal origin: Secondary | ICD-10-CM | POA: Insufficient documentation

## 2017-05-22 ENCOUNTER — Other Ambulatory Visit: Payer: Self-pay | Admitting: Physical Medicine and Rehabilitation

## 2017-05-22 DIAGNOSIS — M5412 Radiculopathy, cervical region: Secondary | ICD-10-CM

## 2017-05-24 ENCOUNTER — Other Ambulatory Visit: Payer: Self-pay | Admitting: Physical Medicine and Rehabilitation

## 2017-05-24 DIAGNOSIS — M5412 Radiculopathy, cervical region: Secondary | ICD-10-CM

## 2017-05-24 DIAGNOSIS — M5030 Other cervical disc degeneration, unspecified cervical region: Secondary | ICD-10-CM

## 2017-06-01 ENCOUNTER — Ambulatory Visit
Admission: RE | Admit: 2017-06-01 | Discharge: 2017-06-01 | Disposition: A | Discharge status: Home / Self Care | Payer: Medicare Other | Source: Ambulatory Visit | Attending: Physical Medicine and Rehabilitation | Admitting: Physical Medicine and Rehabilitation

## 2017-06-01 DIAGNOSIS — M5412 Radiculopathy, cervical region: Secondary | ICD-10-CM | POA: Diagnosis not present

## 2017-06-01 DIAGNOSIS — M5030 Other cervical disc degeneration, unspecified cervical region: Secondary | ICD-10-CM | POA: Insufficient documentation

## 2017-06-01 DIAGNOSIS — M4322 Fusion of spine, cervical region: Secondary | ICD-10-CM | POA: Diagnosis not present

## 2017-06-01 DIAGNOSIS — M47812 Spondylosis without myelopathy or radiculopathy, cervical region: Secondary | ICD-10-CM | POA: Insufficient documentation

## 2017-06-01 DIAGNOSIS — M4802 Spinal stenosis, cervical region: Secondary | ICD-10-CM | POA: Diagnosis not present

## 2017-06-11 ENCOUNTER — Emergency Department: Payer: Medicare Other

## 2017-06-11 ENCOUNTER — Inpatient Hospital Stay
Admission: EM | Admit: 2017-06-11 | Discharge: 2017-06-13 | DRG: 641 | Disposition: A | Discharge status: Home / Self Care | Payer: Medicare Other | Attending: Internal Medicine | Admitting: Internal Medicine

## 2017-06-11 DIAGNOSIS — Z6841 Body Mass Index (BMI) 40.0 and over, adult: Secondary | ICD-10-CM

## 2017-06-11 DIAGNOSIS — E860 Dehydration: Secondary | ICD-10-CM | POA: Diagnosis not present

## 2017-06-11 DIAGNOSIS — R202 Paresthesia of skin: Secondary | ICD-10-CM

## 2017-06-11 DIAGNOSIS — M109 Gout, unspecified: Secondary | ICD-10-CM | POA: Diagnosis present

## 2017-06-11 DIAGNOSIS — R0789 Other chest pain: Secondary | ICD-10-CM | POA: Diagnosis present

## 2017-06-11 DIAGNOSIS — Z794 Long term (current) use of insulin: Secondary | ICD-10-CM

## 2017-06-11 DIAGNOSIS — R0602 Shortness of breath: Secondary | ICD-10-CM

## 2017-06-11 DIAGNOSIS — I4821 Permanent atrial fibrillation: Secondary | ICD-10-CM | POA: Diagnosis present

## 2017-06-11 DIAGNOSIS — Z91013 Allergy to seafood: Secondary | ICD-10-CM

## 2017-06-11 DIAGNOSIS — I2510 Atherosclerotic heart disease of native coronary artery without angina pectoris: Secondary | ICD-10-CM | POA: Diagnosis present

## 2017-06-11 DIAGNOSIS — R2681 Unsteadiness on feet: Secondary | ICD-10-CM | POA: Diagnosis present

## 2017-06-11 DIAGNOSIS — Z885 Allergy status to narcotic agent status: Secondary | ICD-10-CM

## 2017-06-11 DIAGNOSIS — E1122 Type 2 diabetes mellitus with diabetic chronic kidney disease: Secondary | ICD-10-CM | POA: Diagnosis present

## 2017-06-11 DIAGNOSIS — Z881 Allergy status to other antibiotic agents status: Secondary | ICD-10-CM

## 2017-06-11 DIAGNOSIS — I951 Orthostatic hypotension: Secondary | ICD-10-CM | POA: Diagnosis present

## 2017-06-11 DIAGNOSIS — R200 Anesthesia of skin: Secondary | ICD-10-CM

## 2017-06-11 DIAGNOSIS — I482 Chronic atrial fibrillation: Secondary | ICD-10-CM | POA: Diagnosis present

## 2017-06-11 DIAGNOSIS — I509 Heart failure, unspecified: Secondary | ICD-10-CM | POA: Diagnosis present

## 2017-06-11 DIAGNOSIS — I480 Paroxysmal atrial fibrillation: Secondary | ICD-10-CM | POA: Diagnosis present

## 2017-06-11 DIAGNOSIS — R42 Dizziness and giddiness: Secondary | ICD-10-CM | POA: Diagnosis present

## 2017-06-11 DIAGNOSIS — M6281 Muscle weakness (generalized): Secondary | ICD-10-CM | POA: Diagnosis present

## 2017-06-11 DIAGNOSIS — Z9181 History of falling: Secondary | ICD-10-CM | POA: Diagnosis not present

## 2017-06-11 DIAGNOSIS — K219 Gastro-esophageal reflux disease without esophagitis: Secondary | ICD-10-CM | POA: Diagnosis present

## 2017-06-11 DIAGNOSIS — N289 Disorder of kidney and ureter, unspecified: Secondary | ICD-10-CM

## 2017-06-11 DIAGNOSIS — N184 Chronic kidney disease, stage 4 (severe): Secondary | ICD-10-CM | POA: Diagnosis present

## 2017-06-11 DIAGNOSIS — F419 Anxiety disorder, unspecified: Secondary | ICD-10-CM | POA: Diagnosis present

## 2017-06-11 DIAGNOSIS — E782 Mixed hyperlipidemia: Secondary | ICD-10-CM | POA: Diagnosis present

## 2017-06-11 DIAGNOSIS — Z883 Allergy status to other anti-infective agents status: Secondary | ICD-10-CM

## 2017-06-11 DIAGNOSIS — I130 Hypertensive heart and chronic kidney disease with heart failure and stage 1 through stage 4 chronic kidney disease, or unspecified chronic kidney disease: Secondary | ICD-10-CM | POA: Diagnosis present

## 2017-06-11 DIAGNOSIS — Z888 Allergy status to other drugs, medicaments and biological substances status: Secondary | ICD-10-CM

## 2017-06-11 DIAGNOSIS — I10 Essential (primary) hypertension: Secondary | ICD-10-CM | POA: Diagnosis present

## 2017-06-11 DIAGNOSIS — G459 Transient cerebral ischemic attack, unspecified: Secondary | ICD-10-CM

## 2017-06-11 DIAGNOSIS — Z886 Allergy status to analgesic agent status: Secondary | ICD-10-CM

## 2017-06-11 DIAGNOSIS — Z7982 Long term (current) use of aspirin: Secondary | ICD-10-CM

## 2017-06-11 DIAGNOSIS — Z950 Presence of cardiac pacemaker: Secondary | ICD-10-CM

## 2017-06-11 DIAGNOSIS — Z8673 Personal history of transient ischemic attack (TIA), and cerebral infarction without residual deficits: Secondary | ICD-10-CM

## 2017-06-11 DIAGNOSIS — N189 Chronic kidney disease, unspecified: Secondary | ICD-10-CM | POA: Diagnosis present

## 2017-06-11 DIAGNOSIS — Z79899 Other long term (current) drug therapy: Secondary | ICD-10-CM

## 2017-06-11 DIAGNOSIS — M810 Age-related osteoporosis without current pathological fracture: Secondary | ICD-10-CM | POA: Diagnosis present

## 2017-06-11 LAB — CBC WITH DIFFERENTIAL/PLATELET
BASOS ABS: 0 10*3/uL (ref 0–0.1)
BASOS PCT: 1 %
EOS ABS: 0.1 10*3/uL (ref 0–0.7)
Eosinophils Relative: 2 %
HCT: 40.3 % (ref 35.0–47.0)
HEMOGLOBIN: 13.7 g/dL (ref 12.0–16.0)
Lymphocytes Relative: 40 %
Lymphs Abs: 2 10*3/uL (ref 1.0–3.6)
MCH: 33.3 pg (ref 26.0–34.0)
MCHC: 33.9 g/dL (ref 32.0–36.0)
MCV: 98.4 fL (ref 80.0–100.0)
Monocytes Absolute: 0.4 10*3/uL (ref 0.2–0.9)
Monocytes Relative: 9 %
NEUTROS PCT: 48 %
Neutro Abs: 2.5 10*3/uL (ref 1.4–6.5)
Platelets: 151 10*3/uL (ref 150–440)
RBC: 4.1 MIL/uL (ref 3.80–5.20)
RDW: 13 % (ref 11.5–14.5)
WBC: 5.2 10*3/uL (ref 3.6–11.0)

## 2017-06-11 LAB — COMPREHENSIVE METABOLIC PANEL
ALK PHOS: 129 U/L — AB (ref 38–126)
ALT: 28 U/L (ref 14–54)
AST: 49 U/L — ABNORMAL HIGH (ref 15–41)
Albumin: 3.9 g/dL (ref 3.5–5.0)
Anion gap: 11 (ref 5–15)
BUN: 31 mg/dL — ABNORMAL HIGH (ref 6–20)
CALCIUM: 9.3 mg/dL (ref 8.9–10.3)
CHLORIDE: 101 mmol/L (ref 101–111)
CO2: 25 mmol/L (ref 22–32)
CREATININE: 1.67 mg/dL — AB (ref 0.44–1.00)
GFR, EST AFRICAN AMERICAN: 33 mL/min — AB (ref >60)
GFR, EST NON AFRICAN AMERICAN: 29 mL/min — AB (ref >60)
Glucose, Bld: 132 mg/dL — ABNORMAL HIGH (ref 65–99)
Potassium: 3.8 mmol/L (ref 3.5–5.1)
Sodium: 137 mmol/L (ref 135–145)
Total Bilirubin: 1.7 mg/dL — ABNORMAL HIGH (ref 0.3–1.2)
Total Protein: 7.1 g/dL (ref 6.5–8.1)

## 2017-06-11 LAB — GLUCOSE, CAPILLARY
GLUCOSE-CAPILLARY: 106 mg/dL — AB (ref 65–99)
Glucose-Capillary: 47 mg/dL — ABNORMAL LOW (ref 65–99)

## 2017-06-11 LAB — TROPONIN I: Troponin I: <0.03 ng/mL (ref <0.03)

## 2017-06-11 MED ORDER — ONDANSETRON HCL 4 MG/2ML IJ SOLN
4.0000 mg | Freq: Once | INTRAMUSCULAR | Status: AC
  Administered 2017-06-11: 4 mg via INTRAVENOUS
  Filled 2017-06-11: qty 2

## 2017-06-11 MED ORDER — GLUCAGON HCL RDNA (DIAGNOSTIC) 1 MG IJ SOLR
INTRAMUSCULAR | Status: AC
  Administered 2017-06-11: 1 mg via INTRAVENOUS
  Filled 2017-06-11: qty 1

## 2017-06-11 MED ORDER — GLUCAGON HCL (RDNA) 1 MG IJ SOLR
1.0000 mg | Freq: Once | INTRAMUSCULAR | Status: AC
  Administered 2017-06-11: 1 mg via INTRAVENOUS
  Filled 2017-06-11: qty 1

## 2017-06-11 MED ORDER — SODIUM CHLORIDE 0.9 % IV SOLN
Freq: Once | INTRAVENOUS | Status: AC
  Administered 2017-06-11: via INTRAVENOUS

## 2017-06-11 MED ORDER — ASPIRIN 81 MG PO CHEW
324.0000 mg | CHEWABLE_TABLET | Freq: Once | ORAL | Status: AC
  Administered 2017-06-11: 324 mg via ORAL
  Filled 2017-06-11: qty 4

## 2017-06-11 NOTE — ED Triage Notes (Signed)
Pt came to Ed via pov c/o dizziness, hypotension. Reports left sided chest pain. Denies sob. Pt has pacemaker. Reports nausea. Bp 124/46

## 2017-06-11 NOTE — ED Notes (Signed)
4oz orange juice given

## 2017-06-11 NOTE — ED Notes (Signed)
Lab called and requested to come attempt lab draw.

## 2017-06-11 NOTE — ED Notes (Signed)
Patient has finished meal tray, no difficulty noted.

## 2017-06-11 NOTE — ED Notes (Signed)
Pt sugar 47 in triage. Given orange juice. 3 Ivs attempted by 2 different RNs.

## 2017-06-11 NOTE — ED Notes (Signed)
Patient transported to CT 

## 2017-06-11 NOTE — ED Notes (Signed)
Resting more quietly at this time.

## 2017-06-11 NOTE — ED Provider Notes (Signed)
[facility] Emergency Department Provider Note   ____________________________________________   First MD Initiated Contact with Patient 06/11/17 1859     (approximate)  I have reviewed the triage vital signs and the nursing notes.   HISTORY  Chief Complaint Hypotension and Dizziness   HPI Deborah Obrien is a 76 y.o. female Patient complains of dizziness and low blood pressure. She said she had some left-sided chest pain when she was moving around. When she got to triage she had a blood sugar 47 got some orange juice but still was feeling weak and dizzy I gave her some glucagon and that made the dizziness worse. She says she feels very bad but no more chest tightness.she said she had congestive heart failure before she had the pacemaker put in but the pacemaker seems to fix that problem  Past Medical History:  Diagnosis Date  . A-fib ([place]) 2016  . Acid reflux   . Anxiety   . Bowel obstruction ([place])   . CHF (congestive heart failure) ([place]) 2017  . Chronic kidney disease (CKD) stage G3a/A1, moderately decreased glomerular filtration rate (GFR) between 45-59 mL/min/1.73 square meter and albuminuria creatinine ratio less than 30 mg/g   . Diabetes mellitus without complication ([place])   . Dyspnea   . Fatty liver   . GI bleed   . Gout   . Hypertension   . Morbid obesity ([place])   . Osteoarthritis   . Osteoporosis   . Paroxysmal atrial fibrillation (HCC)   . Presence of permanent cardiac pacemaker   . TIA (transient ischemic attack)     Patient Active Problem List   Diagnosis Date Noted  . Mastalgia 02/14/2017  . Chronic kidney disease, stage 3, mod decreased GFR 01/09/2017  . Controlled type 2 diabetes mellitus with stage 4 chronic kidney disease ([place]) 01/09/2017  . Fatty liver 01/09/2017  . Benign essential HTN 09/08/2016  . Coronary artery disease involving native coronary artery of native heart without angina pectoris 09/08/2016  . Stable angina  ([place]) 08/24/2016  . Nephrolithiasis 02/22/2016  . Community acquired pneumonia 01/25/2016  . Pneumonia 01/25/2016  . Diabetic neuropathy with neurologic complication ([place]) 09/32/3557  . Moderate episode of recurrent major depressive disorder ([place]) 08/11/2015  . Gastritis without bleeding   . Gastric polyp   . Gastro-esophageal reflux disease without esophagitis   . Abdominal pain, epigastric   . Sepsis ([place]) 07/22/2015  . Atrial fibrillation ([place]) 06/29/2015  . Intestinal obstruction ([place]) 02/20/2015  . DDD (degenerative disc disease), lumbar 01/19/2015  . Back pain 01/12/2015  . Chronic kidney disease, stage III (moderate) 01/12/2015  . Exercise-induced angina ([place]) 01/12/2015  . Gout 01/12/2015  . History of surgical procedure 11/04/2014  . Complete tear of left rotator cuff 10/16/2014  . Intervertebral disc disorder with radiculopathy of lumbar region 09/17/2014  . Lumbar radiculitis 09/17/2014  . Lumbar stenosis with neurogenic claudication 09/17/2014  . Neuritis or radiculitis due to rupture of lumbar intervertebral disc 09/17/2014  . Bradycardia 08/11/2014  . Atherosclerosis of both carotid arteries 07/15/2014  . Carotid atherosclerosis 07/15/2014  . Essential hypertension 07/15/2014  . Hyperlipemia, mixed 07/15/2014  . 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 Body mass index (BMI) of 40.0-44.9 in adult ([place]) 06/21/2014  . Morbid obesity with BMI of 40.0-44.9, adult ([place]) 06/21/2014  . Mixed incontinence 06/25/2013  . Mixed urge and stress incontinence 06/25/2013  . Nocturia 06/25/2013  . Blurred vision 04/11/2012  . Blurring of visual image 04/11/2012    Past Surgical  History:  Procedure Laterality Date  . ABDOMINAL HYSTERECTOMY  1980  . APPENDECTOMY    . BACK SURGERY     2008  . BREAST BIOPSY Right 08/16   stereo fibroadenomatous change, neg for atypia  . BREAST EXCISIONAL BIOPSY Left    neg  . CARDIAC CATHETERIZATION Left 08/25/2016   Procedure: Left Heart  Cath and Coronary Angiography;  Surgeon: Corey Skains, MD;  Location: [facility];  Service: Cardiovascular;  Laterality: Left;  . CATARACT EXTRACTION, BILATERAL  2012  . CHOLECYSTECTOMY    . colon blockage  1999  . COLON SURGERY  1999  . COLONOSCOPY  2016   polyps removed 2016  . DORSAL COMPARTMENT RELEASE Left 09/14/2016   Procedure: RELEASE DORSAL COMPARTMENT (DEQUERVAIN);  Surgeon: Dereck Leep, MD;  Location: [facility];  Service: Orthopedics;  Laterality: Left;  . ESOPHAGOGASTRODUODENOSCOPY N/A 01/27/2015   Procedure: ESOPHAGOGASTRODUODENOSCOPY (EGD);  Surgeon: Lollie Sails, MD;  Location: [facility] ENDOSCOPY;  Service: Endoscopy;  Laterality: N/A;  . ESOPHAGOGASTRODUODENOSCOPY (EGD) WITH PROPOFOL N/A 07/24/2015   Procedure: ESOPHAGOGASTRODUODENOSCOPY (EGD) WITH PROPOFOL;  Surgeon: Lucilla Lame, MD;  Location: [facility] ENDOSCOPY;  Service: Endoscopy;  Laterality: N/A;  . JOINT REPLACEMENT  2014   Bilateral Knee replacement  . PACEMAKER INSERTION Left 07/01/2015   Procedure: INSERTION PACEMAKER;  Surgeon: Isaias Cowman, MD;  Location: [facility];  Service: Cardiovascular;  Laterality: Left;  . REPLACEMENT TOTAL KNEE BILATERAL    . SHOULDER ARTHROSCOPY WITH SUBACROMIAL DECOMPRESSION Left 2013    Prior to Admission medications   Medication Sig Start Date End Date Taking? Authorizing Provider  acetaminophen (TYLENOL) 500 MG tablet Take 500-1,000 mg by mouth every 6 (six) hours as needed for mild pain or moderate pain.    [provider]  allopurinol (ZYLOPRIM) 100 MG tablet Take 150 mg by mouth daily.    [provider]  amiodarone (PACERONE) 100 MG tablet Take 100 mg by mouth daily.    [provider]  APPLE CIDER VINEGAR PO Take 1 tablet by mouth 2 (two) times daily.    [provider]  aspirin EC 81 MG tablet Take 81 mg by mouth at bedtime.    [provider]  busPIRone (BUSPAR) 10 MG tablet Take 10 mg by mouth 2 (two) times  daily.    [provider]  calcitRIOL (ROCALTROL) 0.25 MCG capsule  11/07/16   [provider]  colchicine 0.6 MG tablet Take 0.6 mg by mouth daily as needed (gout).     [provider]  Cranberry 500 MG CAPS Take 500 mg by mouth daily.     [provider]  docusate sodium (COLACE) 100 MG capsule Take 100 mg by mouth at bedtime.     [provider]  furosemide (LASIX) 20 MG tablet Take 20 mg by mouth every other day. 04/11/16   [provider]  HYDROcodone-acetaminophen (NORCO) 5-325 MG tablet Take 1-2 tablets by mouth every 4 (four) hours as needed for moderate pain. 09/14/16   Hooten, Laurice Record, MD  hydrocortisone 2.5 % cream Apply 1 application topically at bedtime as needed (rash).     [provider]  Hypromellose (ARTIFICIAL TEARS OP) Apply 1 drop to eye daily as needed (dry eyes).    [provider]  insulin NPH-regular Human (NOVOLIN 70/30) (70-30) 100 UNIT/ML injection Inject 22 Units into the skin 2 (two) times daily before a meal.     [provider]  ketoconazole (NIZORAL) 2 % cream Apply  1 application topically every 2 (two) hours as needed (for rash under breasts).     [provider]  metoprolol tartrate (LOPRESSOR) 25 MG tablet Take 12.5 mg by mouth 2 (two) times daily.    [provider]  Misc Natural Products (TART CHERRY ADVANCED PO) Take 1 capsule by mouth daily.    [provider]  mupirocin cream (BACTROBAN) 2 % Apply 1 application topically daily as needed (rash).  01/15/16   [provider]  oxybutynin (DITROPAN) 5 MG tablet Take 10 tablets by mouth at bedtime.  12/28/15   [provider]  pantoprazole (PROTONIX) 40 MG tablet Take 40 mg by mouth 2 (two) times daily.    [provider]  PARoxetine (PAXIL) 20 MG tablet Take 20 mg by mouth daily.    [provider]  pravastatin (PRAVACHOL) 20 MG tablet Take 20 mg by mouth at bedtime.      [provider]  talc (ZEASORB) powder Apply 1 application topically as needed (for rash under breasts).    [provider]  traMADol Veatrice Bourbon) 50 MG tablet  01/17/17   [provider]  traZODone (DESYREL) 50 MG tablet Take 100 tablets by mouth at bedtime.  12/28/15   [provider]    Allergies Cephalosporins; Hydrocodone-acetaminophen; Macrolides and ketolides; Meperidine; Prednisone; Shellfish allergy; Sulfa antibiotics; Uloric [febuxostat]; Aspirin; Celecoxib; Cephalexin; Erythromycin; Hydromorphone; Iodinated diagnostic agents; Oxycodone; Atorvastatin; Codeine; Tape; Valdecoxib; and Iodine  Family History  Problem Relation Age of Onset  . Diabetes Mellitus II Mother   . CAD Mother   . Heart attack Mother   . Cancer Father        skin  . Breast cancer Neg Hx     Social History Social History  Substance Use Topics  . Smoking status: Never Smoker  . Smokeless tobacco: Never Used  . Alcohol use No    Review of Systems  Constitutional: No fever/chills Eyes: No visual changes. ENT: No sore throat. Cardiovascular: see history of present illness Respiratory: Denies shortness of breath. Gastrointestinal: No abdominal pain.   nausea, no vomiting.  No diarrhea.  No constipation. Genitourinary: Negative for dysuria. Musculoskeletal: Negative for back pain. Skin: Negative for rash. Neurological: Negative for headaches, focal weakness  ____________________________________________   PHYSICAL EXAM:  VITAL SIGNS: ED Triage Vitals  Enc Vitals Group     BP 06/11/17 1903 (!) 124/49     Pulse Rate 06/11/17 1903 72     Resp 06/11/17 1903 18     Temp 06/11/17 1903 98.1 F (36.7 C)     Temp src --      SpO2 06/11/17 1903 98 %     Weight 06/11/17 1835 242 lb (109.8 kg)     Height 06/11/17 1835 5\' 4"  (1.626 m)     Head Circumference --      Peak Flow --      Pain Score --      Pain Loc --      Pain Edu? --      Excl. in [place]? --      Constitutional: Alert and oriented. patient is crying and complaining of dizziness and feeling awful Eyes: Conjunctivae are normal. PERRL. EOMI. Head: Atraumatic. Nose: No congestion/rhinnorhea. Mouth/Throat: Mucous membranes are moist.  Oropharynx non-erythematous. Neck: No stridor.  Cardiovascular: Normal rate, regular rhythm. Grossly normal heart sounds.  Good peripheral circulation. Respiratory: Normal respiratory effort.  No retractions. Lungs CTAB. Gastrointestinal: Soft and nontender. No distention. No abdominal bruits. No CVA  tenderness. Musculoskeletal: No lower extremity tenderness nor edema.  No joint effusions. Neurologic:  Normal speech and language. No gross focal neurologic deficits are appreciated. No gait instability. Skin:  Skin is warm, dry and intact. No rash noted. Psychiatric: Mood and affect are normal. Speech and behavior are normal.  ____________________________________________   LABS (all labs ordered are listed, but only abnormal results are displayed)  Labs Reviewed  GLUCOSE, CAPILLARY - Abnormal; Notable for the following:       Result Value   Glucose-Capillary 47 (*)    All other components within normal limits  COMPREHENSIVE METABOLIC PANEL - Abnormal; Notable for the following:    Glucose, Bld 132 (*)    BUN 31 (*)    Creatinine, Ser 1.67 (*)    AST 49 (*)    Alkaline Phosphatase 129 (*)    Total Bilirubin 1.7 (*)    GFR calc non Af Amer 29 (*)    GFR calc Af Amer 33 (*)    All other components within normal limits  GLUCOSE, CAPILLARY - Abnormal; Notable for the following:    Glucose-Capillary 106 (*)    All other components within normal limits  TROPONIN I  CBC WITH DIFFERENTIAL/PLATELET  TROPONIN I  URINALYSIS, COMPLETE (UACMP) WITH MICROSCOPIC  CBG MONITORING, ED   ____________________________________________  EKG  EKG is fully paced with   patient had developed some left-sided chest heaviness she is completely unable to hold  still for almost 5 minutes by the time the patient the pain goes away second EKG is done does not show any acute changes baseline is incredibly wavy rhythm is still fully paced ____________________________________________  RADIOLOGY CT of the head and chest x-ray are read as essentially no acute disease by radiology  ____________________________________________   PROCEDURES  Procedure(s) performed:   Procedures  Critical Care performed:   ____________________________________________   INITIAL IMPRESSION / ASSESSMENT AND PLAN / ED COURSE  Pertinent labs & imaging results that were available during my care of the patient were reviewed by me and considered in my medical decision making (see chart for details).  patient's dizziness is now better at 10:30 head CT is negative for any acute changes however now she is having intermittent numbness and tightness of the left side of the face which was not happening before. Her blood pressure is running anywhere from 27-035 systolic this sounds like it could be TIAs possibly some other posterior circulation thing going on as well. We cannot do an MRI because of her pacemaker. I will put in a consult with Tylenol neurology given her chart to the hospitalist.      ____________________________________________   FINAL CLINICAL IMPRESSION(S) / ED DIAGNOSES  Final diagnoses:  Vertigo  Transient cerebral ischemia, unspecified type      NEW MEDICATIONS STARTED DURING THIS VISIT:  New Prescriptions   No medications on file     Note:  This document was prepared using Dragon voice recognition software and may include unintentional dictation errors.    Nena Polio, MD 06/11/17 2228

## 2017-06-11 NOTE — ED Notes (Signed)
Lab here to draw blood.

## 2017-06-11 NOTE — ED Notes (Signed)
Admitting MD in with patient. 

## 2017-06-11 NOTE — ED Notes (Signed)
Patient reports having headache and that lips feel tingling, MD aware.

## 2017-06-11 NOTE — ED Notes (Addendum)
Attempted lab draw to right hand, unsuccessful.  Called and requested that lab come obtain blood.  Patient does reports feeling some better.

## 2017-06-11 NOTE — ED Notes (Signed)
Physician request that patient have a meal tray.

## 2017-06-11 NOTE — ED Notes (Signed)
Lab here for blood draw

## 2017-06-12 ENCOUNTER — Inpatient Hospital Stay: Payer: Medicare Other

## 2017-06-12 DIAGNOSIS — I482 Chronic atrial fibrillation: Secondary | ICD-10-CM | POA: Diagnosis present

## 2017-06-12 DIAGNOSIS — I509 Heart failure, unspecified: Secondary | ICD-10-CM | POA: Diagnosis present

## 2017-06-12 DIAGNOSIS — F419 Anxiety disorder, unspecified: Secondary | ICD-10-CM | POA: Diagnosis present

## 2017-06-12 DIAGNOSIS — K219 Gastro-esophageal reflux disease without esophagitis: Secondary | ICD-10-CM | POA: Diagnosis present

## 2017-06-12 DIAGNOSIS — I951 Orthostatic hypotension: Secondary | ICD-10-CM | POA: Diagnosis present

## 2017-06-12 DIAGNOSIS — N184 Chronic kidney disease, stage 4 (severe): Secondary | ICD-10-CM | POA: Diagnosis present

## 2017-06-12 DIAGNOSIS — Z6841 Body Mass Index (BMI) 40.0 and over, adult: Secondary | ICD-10-CM | POA: Diagnosis not present

## 2017-06-12 DIAGNOSIS — M109 Gout, unspecified: Secondary | ICD-10-CM | POA: Diagnosis present

## 2017-06-12 DIAGNOSIS — R0602 Shortness of breath: Secondary | ICD-10-CM | POA: Diagnosis present

## 2017-06-12 DIAGNOSIS — R0789 Other chest pain: Secondary | ICD-10-CM | POA: Diagnosis present

## 2017-06-12 DIAGNOSIS — R2681 Unsteadiness on feet: Secondary | ICD-10-CM | POA: Diagnosis present

## 2017-06-12 DIAGNOSIS — Z9181 History of falling: Secondary | ICD-10-CM | POA: Diagnosis not present

## 2017-06-12 DIAGNOSIS — M6281 Muscle weakness (generalized): Secondary | ICD-10-CM | POA: Diagnosis present

## 2017-06-12 DIAGNOSIS — I130 Hypertensive heart and chronic kidney disease with heart failure and stage 1 through stage 4 chronic kidney disease, or unspecified chronic kidney disease: Secondary | ICD-10-CM | POA: Diagnosis present

## 2017-06-12 DIAGNOSIS — E1122 Type 2 diabetes mellitus with diabetic chronic kidney disease: Secondary | ICD-10-CM | POA: Diagnosis present

## 2017-06-12 DIAGNOSIS — R42 Dizziness and giddiness: Secondary | ICD-10-CM

## 2017-06-12 DIAGNOSIS — I2510 Atherosclerotic heart disease of native coronary artery without angina pectoris: Secondary | ICD-10-CM | POA: Diagnosis present

## 2017-06-12 DIAGNOSIS — E860 Dehydration: Secondary | ICD-10-CM | POA: Diagnosis present

## 2017-06-12 LAB — GLUCOSE, CAPILLARY
GLUCOSE-CAPILLARY: 76 mg/dL (ref 65–99)
Glucose-Capillary: 103 mg/dL — ABNORMAL HIGH (ref 65–99)
Glucose-Capillary: 104 mg/dL — ABNORMAL HIGH (ref 65–99)
Glucose-Capillary: 151 mg/dL — ABNORMAL HIGH (ref 65–99)
Glucose-Capillary: 93 mg/dL (ref 65–99)

## 2017-06-12 LAB — CBC
HEMATOCRIT: 39.4 % (ref 35.0–47.0)
HEMOGLOBIN: 13.5 g/dL (ref 12.0–16.0)
MCH: 33.6 pg (ref 26.0–34.0)
MCHC: 34.2 g/dL (ref 32.0–36.0)
MCV: 98.4 fL (ref 80.0–100.0)
Platelets: 135 10*3/uL — ABNORMAL LOW (ref 150–440)
RBC: 4 MIL/uL (ref 3.80–5.20)
RDW: 12.8 % (ref 11.5–14.5)
WBC: 5.2 10*3/uL (ref 3.6–11.0)

## 2017-06-12 LAB — LIPID PANEL
Cholesterol: 129 mg/dL (ref 0–200)
HDL: 46 mg/dL (ref >40)
LDL Cholesterol: 61 mg/dL (ref 0–99)
TRIGLYCERIDES: 110 mg/dL (ref <150)
Total CHOL/HDL Ratio: 2.8 RATIO
VLDL: 22 mg/dL (ref 0–40)

## 2017-06-12 LAB — URINALYSIS, COMPLETE (UACMP) WITH MICROSCOPIC
BILIRUBIN URINE: NEGATIVE
GLUCOSE, UA: NEGATIVE mg/dL
HGB URINE DIPSTICK: NEGATIVE
KETONES UR: NEGATIVE mg/dL
LEUKOCYTES UA: NEGATIVE
NITRITE: NEGATIVE
Protein, ur: NEGATIVE mg/dL
Specific Gravity, Urine: 1.012 (ref 1.005–1.030)
pH: 6 (ref 5.0–8.0)

## 2017-06-12 LAB — BASIC METABOLIC PANEL
ANION GAP: 12 (ref 5–15)
BUN: 30 mg/dL — ABNORMAL HIGH (ref 6–20)
CO2: 24 mmol/L (ref 22–32)
Calcium: 8.8 mg/dL — ABNORMAL LOW (ref 8.9–10.3)
Chloride: 103 mmol/L (ref 101–111)
Creatinine, Ser: 1.72 mg/dL — ABNORMAL HIGH (ref 0.44–1.00)
GFR calc Af Amer: 32 mL/min — ABNORMAL LOW (ref >60)
GFR, EST NON AFRICAN AMERICAN: 28 mL/min — AB (ref >60)
GLUCOSE: 123 mg/dL — AB (ref 65–99)
Potassium: 3.6 mmol/L (ref 3.5–5.1)
Sodium: 139 mmol/L (ref 135–145)

## 2017-06-12 LAB — TROPONIN I: Troponin I: <0.03 ng/mL (ref <0.03)

## 2017-06-12 MED ORDER — COLCHICINE 0.6 MG PO TABS
0.6000 mg | ORAL_TABLET | Freq: Every day | ORAL | Status: DC | PRN
  Filled 2017-06-12: qty 1

## 2017-06-12 MED ORDER — HYDROCODONE-ACETAMINOPHEN 5-325 MG PO TABS: 1.0000 | ORAL_TABLET | ORAL | Status: DC | PRN

## 2017-06-12 MED ORDER — ACETAMINOPHEN 325 MG PO TABS: 650.0000 mg | ORAL_TABLET | Freq: Four times a day (QID) | ORAL | Status: DC | PRN

## 2017-06-12 MED ORDER — INSULIN ASPART 100 UNIT/ML ~~LOC~~ SOLN
0.0000 [IU] | Freq: Three times a day (TID) | SUBCUTANEOUS | Status: DC
  Administered 2017-06-13: 3 [IU] via SUBCUTANEOUS
  Filled 2017-06-12: qty 1

## 2017-06-12 MED ORDER — ENOXAPARIN SODIUM 30 MG/0.3ML ~~LOC~~ SOLN: 30.0000 mg | SUBCUTANEOUS | Status: DC

## 2017-06-12 MED ORDER — ACETAMINOPHEN 650 MG RE SUPP: 650.0000 mg | Freq: Four times a day (QID) | RECTAL | Status: DC | PRN

## 2017-06-12 MED ORDER — ONDANSETRON HCL 4 MG/2ML IJ SOLN: 4.0000 mg | Freq: Four times a day (QID) | INTRAMUSCULAR | Status: DC | PRN

## 2017-06-12 MED ORDER — POTASSIUM CHLORIDE IN NACL 20-0.9 MEQ/L-% IV SOLN
INTRAVENOUS | Status: DC
  Administered 2017-06-12: 50 mL/h via INTRAVENOUS
  Filled 2017-06-12 (×4): qty 1000

## 2017-06-12 MED ORDER — TRAZODONE HCL 50 MG PO TABS
50.0000 mg | ORAL_TABLET | Freq: Every day | ORAL | Status: DC
  Administered 2017-06-12: 21:00:00 50 mg via ORAL
  Filled 2017-06-12: qty 1

## 2017-06-12 MED ORDER — METOPROLOL TARTRATE 25 MG PO TABS
12.5000 mg | ORAL_TABLET | Freq: Two times a day (BID) | ORAL | Status: DC
  Administered 2017-06-12: 09:00:00 12.5 mg via ORAL
  Filled 2017-06-12 (×2): qty 1

## 2017-06-12 MED ORDER — PAROXETINE HCL 20 MG PO TABS
20.0000 mg | ORAL_TABLET | Freq: Every day | ORAL | Status: DC
  Administered 2017-06-12 – 2017-06-13 (×2): 20 mg via ORAL
  Filled 2017-06-12 (×2): qty 1

## 2017-06-12 MED ORDER — HYDROCODONE-ACETAMINOPHEN 5-325 MG PO TABS: 2.0000 | ORAL_TABLET | ORAL | Status: DC | PRN

## 2017-06-12 MED ORDER — BUSPIRONE HCL 10 MG PO TABS
10.0000 mg | ORAL_TABLET | Freq: Two times a day (BID) | ORAL | Status: DC
  Administered 2017-06-12 – 2017-06-13 (×3): 10 mg via ORAL
  Filled 2017-06-12 (×4): qty 1

## 2017-06-12 MED ORDER — PANTOPRAZOLE SODIUM 40 MG PO TBEC
40.0000 mg | DELAYED_RELEASE_TABLET | Freq: Two times a day (BID) | ORAL | Status: DC
  Administered 2017-06-12 – 2017-06-13 (×3): 40 mg via ORAL
  Filled 2017-06-12 (×3): qty 1

## 2017-06-12 MED ORDER — ASPIRIN EC 81 MG PO TBEC
81.0000 mg | DELAYED_RELEASE_TABLET | Freq: Every day | ORAL | Status: DC
  Administered 2017-06-12: 81 mg via ORAL
  Filled 2017-06-12: qty 1

## 2017-06-12 MED ORDER — INSULIN ASPART 100 UNIT/ML ~~LOC~~ SOLN: 0.0000 [IU] | Freq: Every day | SUBCUTANEOUS | Status: DC

## 2017-06-12 MED ORDER — SODIUM CHLORIDE 0.9 % IV BOLUS (SEPSIS)
1000.0000 mL | Freq: Once | INTRAVENOUS | Status: AC
  Administered 2017-06-12: 1000 mL via INTRAVENOUS

## 2017-06-12 MED ORDER — INSULIN ASPART PROT & ASPART (70-30 MIX) 100 UNIT/ML ~~LOC~~ SUSP
22.0000 [IU] | Freq: Two times a day (BID) | SUBCUTANEOUS | Status: DC
  Administered 2017-06-12 – 2017-06-13 (×3): 22 [IU] via SUBCUTANEOUS
  Filled 2017-06-12 (×4): qty 10

## 2017-06-12 MED ORDER — PRAVASTATIN SODIUM 20 MG PO TABS
20.0000 mg | ORAL_TABLET | Freq: Every day | ORAL | Status: DC
  Administered 2017-06-12: 20 mg via ORAL
  Filled 2017-06-12: qty 1

## 2017-06-12 MED ORDER — AMIODARONE HCL 200 MG PO TABS
100.0000 mg | ORAL_TABLET | Freq: Every day | ORAL | Status: DC
  Administered 2017-06-13: 100 mg via ORAL
  Filled 2017-06-12 (×2): qty 1

## 2017-06-12 MED ORDER — ONDANSETRON HCL 4 MG PO TABS: 4.0000 mg | ORAL_TABLET | Freq: Four times a day (QID) | ORAL | Status: DC | PRN

## 2017-06-12 MED ORDER — ENOXAPARIN SODIUM 40 MG/0.4ML ~~LOC~~ SOLN: 40.0000 mg | SUBCUTANEOUS | Status: DC

## 2017-06-12 MED ORDER — OXYBUTYNIN CHLORIDE 5 MG PO TABS
5.0000 mg | ORAL_TABLET | Freq: Every day | ORAL | Status: DC
  Administered 2017-06-12: 21:00:00 5 mg via ORAL
  Filled 2017-06-12: qty 1

## 2017-06-12 MED ORDER — DOCUSATE SODIUM 100 MG PO CAPS
100.0000 mg | ORAL_CAPSULE | Freq: Every day | ORAL | Status: DC
  Administered 2017-06-12: 21:00:00 100 mg via ORAL
  Filled 2017-06-12: qty 1

## 2017-06-12 MED ORDER — ENOXAPARIN SODIUM 40 MG/0.4ML ~~LOC~~ SOLN
40.0000 mg | SUBCUTANEOUS | Status: DC
  Administered 2017-06-12 – 2017-06-13 (×2): 40 mg via SUBCUTANEOUS
  Filled 2017-06-12 (×2): qty 0.4

## 2017-06-12 MED ORDER — ALLOPURINOL 150 MG HALF TABLET
150.0000 mg | ORAL_TABLET | Freq: Every day | ORAL | Status: DC
  Administered 2017-06-12 – 2017-06-13 (×2): 150 mg via ORAL
  Filled 2017-06-12 (×2): qty 1

## 2017-06-12 NOTE — Consult Note (Signed)
[facility] Consultation Note  Patient ID: Deborah Obrien, MRN: [id number], DOB/AGE: 77-Feb-1942 77 y.o. Admit date: 06/11/2017   Date of Consult: 06/12/2017 Primary Physician: [name], MD [place]  Chief Complaint:  Chief Complaint  Patient presents with  . Hypotension  . Dizziness   Reason for Consult: pre-syncope dizziness  HPI: 76 y.o. female with known paroxysmal nonvalvular atrial fibrillation remaining in normal sinus rhythm with amiodarone, essential hypertension on metoprolol and reasonably stable over the last several months with diabetes with complications essential hypertension and hyperlipidemia. The patient has had a recent treatment with antibiotics for which the patient had significant severe reaction as well as some nausea and diarrhea. Over a several day. She had possibly had some difficulty with dehydration and other concerns with a low blood pressure. She was quite dizzy over the weekend and then was seen in the emergency room with an episode of presyncope and dizziness upon every time she stood up. It does appear that she had some orthostatic hypotension. The patient has had current holding of her beta blocker and remained in normal sinus rhythm with amiodarone while she's received some hydration. She does feel better but has not been up and around. She did have some atypical right-sided chest discomfort as well as right sided neck discomfort which is totally resolved at this point. EKG was normal and the patient has had a normal troponin without evidence of myocardial infarction. There is evidence of no heart failure today  Past Medical History:  Diagnosis Date  . A-fib ([place]) 2016  . Acid reflux   . Anxiety   . Bowel obstruction ([place])   . CHF (congestive heart failure) ([place]) 2017  . Chronic kidney disease (CKD) stage G3a/A1, moderately decreased glomerular filtration rate (GFR) between 45-59 mL/min/1.73 square meter and  albuminuria creatinine ratio less than 30 mg/g (HCC)   . Diabetes mellitus without complication ([place])   . Dyspnea   . Fatty liver   . GI bleed   . Gout   . Hypertension   . Morbid obesity ([place])   . Osteoarthritis   . Osteoporosis   . Paroxysmal atrial fibrillation (HCC)   . Presence of permanent cardiac pacemaker   . TIA (transient ischemic attack)       Surgical History:  Past Surgical History:  Procedure Laterality Date  . ABDOMINAL HYSTERECTOMY  1980  . APPENDECTOMY    . BACK SURGERY     2008  . BREAST BIOPSY Right 08/16   stereo fibroadenomatous change, neg for atypia  . BREAST EXCISIONAL BIOPSY Left    neg  . CARDIAC CATHETERIZATION Left 08/25/2016   Procedure: Left Heart Cath and Coronary Angiography;  Surgeon: Corey Skains, MD;  Location: [facility];  Service: Cardiovascular;  Laterality: Left;  . CATARACT EXTRACTION, BILATERAL  2012  . CHOLECYSTECTOMY    . colon blockage  1999  . COLON SURGERY  1999  . COLONOSCOPY  2016   polyps removed 2016  . DORSAL COMPARTMENT RELEASE Left 09/14/2016   Procedure: RELEASE DORSAL COMPARTMENT (DE[place]);  Surgeon: Dereck Leep, MD;  Location: [facility];  Service: Orthopedics;  Laterality: Left;  . ESOPHAGOGASTRODUODENOSCOPY N/A 01/27/2015   Procedure: ESOPHAGOGASTRODUODENOSCOPY (EGD);  Surgeon: Lollie Sails, MD;  Location[facility];  Service: Endoscopy;  Laterality: N/A;  . ESOPHAGOGASTRODUODENOSCOPY (EGD) WITH PROPOFOL N/A 07/24/2015   Procedure: ESOPHAGOGASTRODUODENOSCOPY (EGD) WITH PROPOFOL;  Surgeon: Lucilla Lame, MD;  Location: [facility] ENDOSCOPY;  Service: Endoscopy;  Laterality: N/A;  .  JOINT REPLACEMENT  2014   Bilateral Knee replacement  . PACEMAKER INSERTION Left 07/01/2015   Procedure: INSERTION PACEMAKER;  Surgeon: Isaias Cowman, MD;  Location: [facility];  Service: Cardiovascular;  Laterality: Left;  . REPLACEMENT TOTAL KNEE BILATERAL    . SHOULDER ARTHROSCOPY WITH SUBACROMIAL DECOMPRESSION  Left 2013     Home Meds: Prior to Admission medications   Medication Sig Start Date End Date Taking? Authorizing Provider  acetaminophen (TYLENOL) 500 MG tablet Take 500-1,000 mg by mouth every 6 (six) hours as needed for mild pain or moderate pain.   Yes [provider]  allopurinol (ZYLOPRIM) 100 MG tablet Take 150 mg by mouth daily.   Yes [provider]  amiodarone (PACERONE) 100 MG tablet Take 100 mg by mouth daily.   Yes [provider]  APPLE CIDER VINEGAR PO Take 1 tablet by mouth 2 (two) times daily.   Yes [provider]  aspirin EC 81 MG tablet Take 81 mg by mouth at bedtime.   Yes [provider]  busPIRone (BUSPAR) 10 MG tablet Take 10 mg by mouth 2 (two) times daily.   Yes [provider]  calcitRIOL (ROCALTROL) 0.25 MCG capsule Take 0.25 mcg by mouth daily.  11/07/16  Yes [provider]  colchicine 0.6 MG tablet Take 0.6 mg by mouth daily as needed (gout).    Yes [provider]  Cranberry 500 MG CAPS Take 500 mg by mouth daily.    Yes [provider]  hydrocortisone 2.5 % cream Apply 1 application topically at bedtime as needed (rash).    Yes [provider]  Hypromellose (ARTIFICIAL TEARS OP) Apply 1 drop to eye daily as needed (dry eyes).   Yes [provider]  insulin NPH-regular Human (NOVOLIN 70/30) (70-30) 100 UNIT/ML injection Inject 22 Units into the skin 2 (two) times daily before a meal.    Yes [provider]  ketoconazole (NIZORAL) 2 % cream Apply 1 application topically every 2 (two) hours as needed (for rash under breasts).    Yes [provider]  metoprolol tartrate (LOPRESSOR) 25 MG tablet Take 12.5 mg by mouth 2 (two) times daily.   Yes [provider]  Misc Natural Products (TART CHERRY ADVANCED PO) Take 1 capsule by mouth daily.   Yes [provider]  mupirocin cream (BACTROBAN) 2 % Apply 1 application topically daily as needed  (rash).  01/15/16  Yes [provider]  oxybutynin (DITROPAN) 5 MG tablet Take 10 tablets by mouth at bedtime.  12/28/15  Yes [provider]  pantoprazole (PROTONIX) 40 MG tablet Take 40 mg by mouth 2 (two) times daily.   Yes [provider]  PARoxetine (PAXIL) 20 MG tablet Take 20 mg by mouth daily.   Yes [provider]  pravastatin (PRAVACHOL) 20 MG tablet Take 20 mg by mouth at bedtime.    Yes [provider]  talc (ZEASORB) powder Apply 1 application topically as needed (for rash under breasts).   Yes [provider]  traMADol (ULTRAM) 50 MG tablet Take 50 mg by mouth as needed.  01/17/17  Yes [provider]  traZODone (DESYREL) 50 MG tablet Take 100 tablets by mouth at bedtime.  12/28/15  Yes [provider]  docusate sodium (COLACE) 100 MG capsule Take 100 mg by mouth at bedtime.     [provider]  furosemide (LASIX) 20 MG tablet Take 20 mg by mouth every other day. 04/11/16   [provider]  HYDROcodone-acetaminophen (NORCO) 5-325 MG tablet Take 1-2 tablets by mouth every 4 (four) hours as needed for moderate pain. Patient not taking: Reported on 06/11/2017 09/14/16   Dereck Leep, MD    Inpatient Medications:  . allopurinol  150 mg Oral Daily  . amiodarone  100 mg Oral Daily  . aspirin EC  81 mg Oral QHS  . busPIRone  10 mg Oral BID  . docusate sodium  100 mg Oral QHS  . enoxaparin (LOVENOX) injection  40 mg Subcutaneous Q24H  . insulin aspart  0-15 Units Subcutaneous TID WC  . insulin aspart  0-5 Units Subcutaneous QHS  . insulin aspart protamine- aspart  22 Units Subcutaneous BID WC  . metoprolol tartrate  12.5 mg Oral BID  . oxybutynin  5 mg Oral QHS  . pantoprazole  40 mg Oral BID  . PARoxetine  20 mg Oral Daily  . pravastatin  20 mg Oral QHS  . traZODone  50 mg Oral QHS   . 0.9 % NaCl with KCl 20 mEq / L 50 mL/hr (06/12/17 0342)    Allergies:  Allergies  Allergen Reactions  .  Cephalosporins Other (See Comments)    Reaction:  Unknown   . Hydrocodone-Acetaminophen Hives  . Macrolides And Ketolides Other (See Comments)    Reaction:  Unknown   . Meperidine Shortness Of Breath, Nausea Only and Other (See Comments)    Reaction:  Stomach pain   . Prednisone Anaphylaxis and Other (See Comments)    Reaction:  Increases pts blood sugar  Pt states that she is allergic to all steroids.    . Shellfish Allergy Shortness Of Breath, Diarrhea, Nausea Only, Rash and Other (See Comments)    Reaction:  Stomach pain   . Sulfa Antibiotics Shortness Of Breath, Diarrhea, Nausea Only and Other (See Comments)    Reaction:  Stomach pain   . Uloric [Febuxostat] Anaphylaxis    Locks pt's body up Locks pt's body up  . Aspirin Other (See Comments)    Reaction:  Stomach pain   . Celecoxib Other (See Comments)    Reaction:  GI bleed, weakness, and stomach pain.    . Cephalexin Diarrhea, Nausea Only and Other (See Comments)    Reaction:  Stomach pain   . Erythromycin Diarrhea, Nausea Only and Other (See Comments)    Reaction:  Stomach pain   . Hydromorphone Other (See Comments)    Reaction:  Hypotension   . Iodinated Diagnostic Agents Rash and Other (See Comments)    Pt states that she is unable to have because she has chronic kidney disease.    Marland Kitchen Oxycodone Other (See Comments)    Reaction:  Stomach pain   . Atorvastatin Nausea Only and Other (See Comments)    Reaction:  Weakness   . Codeine Diarrhea, Nausea Only and Other (See Comments)    Reaction:  Stomach pain   . Tape Other (See Comments)    Reaction:  Causes pts skin to tear  Pt states that she is able to use paper tape.    . Valdecoxib Swelling and Other (See Comments)    Pt states that her hands and feet swell.    . Iodine Rash    Social History   Social History  . Marital status: Married    Spouse name: N/A  . Number of children: N/A  . Years of education: N/A   Occupational History  . retired    Social  History Main Topics  . Smoking  status: Never Smoker  . Smokeless tobacco: Never Used  . Alcohol use No  . Drug use: No  . Sexual activity: Yes   Other Topics Concern  . Not on file   Social History Narrative  . No narrative on file     Family History  Problem Relation Age of Onset  . Diabetes Mellitus II Mother   . CAD Mother   . Heart attack Mother   . Cancer Father        skin  . Breast cancer Neg Hx      Review of Systems Positive forDizziness presyncope Negative for: General:  chills, fever, night sweats or weight changes.  Cardiovascular: PND orthopnea positive for syncope dizziness  Dermatological skin lesions rashes Respiratory: Cough congestion Urologic: Frequent urination urination at night and hematuria Abdominal: negative for nausea, vomiting, diarrhea, bright red blood per rectum, melena, or hematemesis Neurologic: negative for visual changes, and/or hearing changes  All other systems reviewed and are otherwise negative except as noted above.  Labs:  Recent Labs  06/11/17 1950 06/11/17 2159 06/12/17 0427 06/12/17 1055  TROPONINI <0.03 <0.03 <0.03 <0.03   Lab Results  Component Value Date   WBC 5.2 06/12/2017   HGB 13.5 06/12/2017   HCT 39.4 06/12/2017   MCV 98.4 06/12/2017   PLT 135 (L) 06/12/2017    Recent Labs Lab 06/11/17 1950 06/12/17 0427  NA 137 139  K 3.8 3.6  CL 101 103  CO2 25 24  BUN 31* 30*  CREATININE 1.67* 1.72*  CALCIUM 9.3 8.8*  PROT 7.1  --   BILITOT 1.7*  --   ALKPHOS 129*  --   ALT 28  --   AST 49*  --   GLUCOSE 132* 123*   Lab Results  Component Value Date   CHOL 129 06/12/2017   HDL 46 06/12/2017   LDLCALC 61 06/12/2017   TRIG 110 06/12/2017   No results found for: DDIMER  Radiology/Studies:  Ct Head Wo Contrast  Result Date: 06/12/2017 CLINICAL DATA:  Left-sided facial numbness.  Headache. EXAM: CT HEAD WITHOUT CONTRAST TECHNIQUE: Contiguous axial images were obtained from the base of the skull  through the vertex without intravenous contrast. COMPARISON:  June 11, 2017 FINDINGS: Brain: The ventricles are normal in size and configuration. There is no intracranial mass, hemorrhage, extra-axial fluid collection, or midline shift. Patchy small vessel disease in the centra semiovale bilaterally is stable. There is no new gray-white compartment lesion. No acute infarct is evident. Vascular: There is no appreciable hyperdense vessel. There is calcification in each carotid siphon. Skull: The bony calvarium appears intact. Moderate hyperostosis is noted, stable. Sinuses/Orbits: Visualized paranasal sinuses are clear. Visualized orbits appear symmetric bilaterally. Other: Visualized mastoid air cells are clear.  Caps IMPRESSION: Stable mild cerebellar atrophy. Patchy periventricular small vessel disease. No intracranial mass, hemorrhage, or extra-axial fluid collection. No acute infarct evident. Foci of arterial vascular calcification noted. Electronically Signed   By: Lowella Grip III M.D.   On: 06/12/2017 10:42   Ct Head Wo Contrast  Result Date: 06/11/2017 CLINICAL DATA:  Chronic headache. EXAM: CT HEAD WITHOUT CONTRAST TECHNIQUE: Contiguous axial images were obtained from the base of the skull through the vertex without intravenous contrast. COMPARISON:  CT scan of December 23, 2013. FINDINGS: Brain: Mild chronic ischemic white matter disease is noted. No mass effect or midline shift is noted. Ventricular size is within normal limits. There is no evidence of mass lesion, hemorrhage or acute infarction. Vascular: No hyperdense  vessel or unexpected calcification. Skull: Normal. Negative for fracture or focal lesion. Sinuses/Orbits: No acute finding. Other: None. IMPRESSION: Mild chronic ischemic white matter disease. No acute intracranial abnormality seen. Electronically Signed   By: Marijo Conception, M.D.   On: 06/11/2017 21:46   Ct Cervical Spine Wo Contrast  Result Date: 06/01/2017 CLINICAL DATA:   Chronic neck pain. Left arm numbness over the last 2 months. EXAM: CT CERVICAL SPINE WITHOUT CONTRAST TECHNIQUE: Multidetector CT imaging of the cervical spine was performed without intravenous contrast. Multiplanar CT image reconstructions were also generated. COMPARISON:  None. FINDINGS: Alignment: Normal Skull base and vertebrae: Previous ACDF C6-7. Soft tissues and spinal canal: No significant soft tissue finding. Disc levels: Foramen magnum is widely patent. C1-2 shows ordinary osteoarthritis without encroachment upon the neural spaces. C2-3:  Normal interspace. C3-4: Mild bulging of the disc and uncovertebral hypertrophy. No canal stenosis. Mild bony foraminal narrowing bilaterally. C4-5: Endplate osteophytes and bulging of the disc more towards the right. No facet arthropathy. Mild foraminal narrowing on the left and moderate foraminal narrowing on the right. C5-6: Spondylosis with endplate osteophytes and bulging of the disc more prominent towards the right. Mild bony foraminal narrowing on the left and moderate foraminal narrowing on the right. C6-7: Good appearance following ACDF. Wide patency of the canal and foramina. C7-T1: Facet osteoarthritis right worse than left. Small endplate osteophytes. Mild foraminal narrowing on the right that could possibly affect the C8 nerve. Upper thoracic region:  No significant finding. Upper chest: Mild pleural and parenchymal scarring at the apices. Other: None IMPRESSION: Good appearance at the fusion level of C6-7. Right-sided predominant spondylosis at C3-4, C4-5 and C5-6. Right-sided foraminal narrowing at those levels that could possibly affect the right-sided nerve roots. Only mild left-sided foraminal narrowing at those levels. Facet arthropathy on the right at C7-T1. Bony foraminal narrowing on the right that could possibly affect the C8 nerve. Given the right-sided predominant findings, I do not see a distinct cause for left arm symptoms. Electronically Signed    By: [place] Chimes M.D.   On: 06/01/2017 13:42   US Carotid Bilateral  Result Date: 06/12/2017 CLINICAL DATA:  76 year old female with a history of numbness and tingling. Cardiovascular risk factors include hypertension, prior stroke/ TIA, known coronary artery disease, hyperlipidemia, diabetes EXAM: BILATERAL CAROTID DUPLEX ULTRASOUND TECHNIQUE: Pearline Cables scale imaging, color Doppler and duplex ultrasound were performed of bilateral carotid and vertebral arteries in the neck. COMPARISON:  No prior duplex FINDINGS: Criteria: Quantification of carotid stenosis is based on velocity parameters that correlate the residual internal carotid diameter with NASCET-based stenosis levels, using the diameter of the distal internal carotid lumen as the denominator for stenosis measurement. The following velocity measurements were obtained: RIGHT ICA:  Systolic 403 cm/sec, Diastolic 22 cm/sec CCA:  98 cm/sec SYSTOLIC ICA/CCA RATIO:  1.1 ECA:  163 cm/sec LEFT ICA:  Systolic 474 cm/sec, Diastolic 18 cm/sec CCA:  259 cm/sec SYSTOLIC ICA/CCA RATIO:  0.8 ECA:  121 cm/sec Right Brachial SBP: Not acquired Left Brachial SBP: Not acquired RIGHT CAROTID ARTERY: No significant calcifications of the right common carotid artery. Intermediate waveform maintained. Heterogeneous and partially calcified plaque at the right carotid bifurcation. No significant lumen shadowing. Low resistance waveform of the right ICA. No significant tortuosity. RIGHT VERTEBRAL ARTERY: Antegrade flow with low resistance waveform. LEFT CAROTID ARTERY: No significant calcifications of the left common carotid artery. Intermediate waveform maintained. Heterogeneous and partially calcified plaque at the left carotid bifurcation without significant lumen shadowing. Low resistance  waveform of the left ICA. No significant tortuosity. LEFT VERTEBRAL ARTERY:  Antegrade flow with low resistance waveform. IMPRESSION: Color duplex indicates minimal heterogeneous and calcified  plaque, with no hemodynamically significant stenosis by duplex criteria in the extracranial cerebrovascular circulation. Signed, Dulcy Fanny. Earleen Newport, DO [facility] Electronically Signed   By: Corrie Mckusick D.O.   On: 06/12/2017 10:30   Dg Chest Port 1 View  Result Date: 06/11/2017 CLINICAL DATA:  Chest pain. EXAM: PORTABLE CHEST 1 VIEW COMPARISON:  Radiograph of Jan 26, 2016. FINDINGS: The heart size and mediastinal contours are within normal limits. Atherosclerosis of thoracic aorta is noted. Single lead left-sided pacemaker is unchanged in position. No pneumothorax or pleural effusion is noted. Both lungs are clear. The visualized skeletal structures are unremarkable. IMPRESSION: No acute cardiopulmonary abnormality seen.  Aortic atherosclerosis. Electronically Signed   By: Marijo Conception, M.D.   On: 06/11/2017 19:40    EKG: Normal sinus rhythm  Weights: Filed Weights   06/11/17 1835  Weight: 109.8 kg (242 lb)     Physical Exam: Blood pressure (!) 95/45, pulse 64, temperature 98.2 F (36.8 C), temperature source Oral, resp. rate 20, height 5\' 4"  (1.626 m), weight 109.8 kg (242 lb), SpO2 100 %. Body mass index is 41.54 kg/m. General: Well developed, well nourished, in no acute distress. Head eyes ears nose throat: Normocephalic, atraumatic, sclera non-icteric, no xanthomas, nares are without discharge. No apparent thyromegaly and/or mass  Lungs: Normal respiratory effort.  no wheezes, no rales, no rhonchi.  Heart: RRR with normal S1 S2. no murmur gallop, no rub, PMI is normal size and placement, carotid upstroke normal without bruit, jugular venous pressure is normal Abdomen: Soft, non-tender, non-distended with normoactive bowel sounds. No hepatomegaly. No rebound/guarding. No obvious abdominal masses. Abdominal aorta is normal size without bruit Extremities: No edema. no cyanosis, no clubbing, no ulcers  Peripheral : 2+ bilateral  upper extremity pulses, 2+ bilateral femoral pulses, 2+ bilateral dorsal pedal pulse Neuro: Alert and oriented. No facial asymmetry. No focal deficit. Moves all extremities spontaneously. Musculoskeletal: Normal muscle tone without kyphosis Psych:  Responds to questions appropriately with a normal affect.    Assessment: 76 year old female with essential hypertension mixed hyperlipidemia diabetes with complication and atrial fibrillation currently remaining in normal sinus rhythm having possible dehydration diarrhea and orthostatic hypotension with dizziness and no current evidence of myocardial infarction or heart failure  Plan: 1. Hydration for possible dehydration and dizziness 2. Continue to hold metoprolol at this time due to concerns of hypotension and possible bradycardia 3. Continue amiodarone for further risk reduction in atrial fibrillation 4. Pravastatin for further risk reduction in cardiovascular disease 5. No further cardiac diagnostics necessary at this time due to no evidence of the myocardial infarction or congestive heart failure 6. In ambulation and follow for improvements of symptoms and possible discharge to home  Signed, Corey Skains M.D. [facility] 06/12/2017, 1:37 PM

## 2017-06-12 NOTE — ED Notes (Signed)
Dr. Clotilde Dieter notified of orthostatic changes, orders received.

## 2017-06-12 NOTE — Progress Notes (Signed)
Assumed care from [facility]. Pt alert and oriented, NS bolus infusing. Orthostatic vital signs taken, BP standing=89/68, PR=154. Pt c/o being lightheaded when standing up. Vital signs rechecked, BP=113/54, PR=61. Dr Estanislado Pandy made aware. No new orders made. Pt for repeat CT of the head in the morning. Nursing continues to monitor.

## 2017-06-12 NOTE — H&P (Signed)
PCP:   [name], MD   Chief Complaint:  Dizziness  HPI: This is a 76 year old female with multiple comorbidities. He states on Monday she saw her cardiologist and her dermatologist. Her dermatologist prescribed doxycycline to which she had a poor response including abdominal pain and diarrhea. She discontinued the medication. On Friday she the flu shot. On Sunday she started feeling dizzy, she checked her blood pressure falling. Her lowest blood pressure was 83/47. She had associated nausea but no vomiting. She reports a headache. She went on to develop chest tightness. She later states the left side of her face felt as it was trying to go to sleep. Her dizziness kept recurring. She reports numbness on the lips. She reports chills but no fevers. She denies any burning on urination. She came to the ER. In the ER teleneurology has been consulted  Review of Systems:  The patient denies anorexia, fever, weight loss,, vision loss, decreased hearing, hoarseness, chest pain, syncope, dyspnea on exertion, peripheral edema, balance deficits, hemoptysis, abdominal pain, melena, hematochezia, severe indigestion/heartburn, hematuria, incontinence, genital sores, muscle weakness, suspicious skin lesions, transient blindness, difficulty walking, depression, unusual weight change, abnormal bleeding, enlarged lymph nodes, angioedema, and breast masses.  Past Medical History: Past Medical History:  Diagnosis Date  . A-fib (HCC) 2016  . Acid reflux   . Anxiety   . Bowel obstruction (HCC)   . CHF (congestive heart failure) (HCC) 2017  . Chronic kidney disease (CKD) stage G3a/A1, moderately decreased glomerular filtration rate (GFR) between 45-59 mL/min/1.73 square meter and albuminuria creatinine ratio less than 30 mg/g   . Diabetes mellitus without complication (HCC)   . Dyspnea   . Fatty liver   . GI bleed   . Gout   . Hypertension   . Morbid obesity (HCC)   . Osteoarthritis   . Osteoporosis    . Paroxysmal atrial fibrillation (HCC)   . Presence of permanent cardiac pacemaker   . TIA (transient ischemic attack)    Past Surgical History:  Procedure Laterality Date  . ABDOMINAL HYSTERECTOMY  1980  . APPENDECTOMY    . BACK SURGERY     20 08  . BREAST BIOPSY Right 08/16   stereo fibroadenomatous change, neg for atypia  . BREAST EXCISIONAL BIOPSY Left    neg  . CARDIAC CATHETERIZATION Left 08/25/2016   Procedure: Left Heart Cath and Coronary Angiography;  Surgeon: Corey Skains, MD;  Location: [facility];  Service: Cardiovascular;  Laterality: Left;  . CATARACT EXTRACTION, BILATERAL  2012  . CHOLECYSTECTOMY    . colon blockage  1999  . COLON SURGERY  1999  . COLONOSCOPY  2016   polyps removed 2016  . DORSAL COMPARTMENT RELEASE Left 09/14/2016   Procedure: RELEASE DORSAL COMPARTMENT (DEQUERVAIN);  Surgeon: Dereck Leep, MD;  Location: [facility];  Service: Orthopedics;  Laterality: Left;  . ESOPHAGOGASTRODUODENOSCOPY N/A 01/27/2015   Procedure: ESOPHAGOGASTRODUODENOSCOPY (EGD);  Surgeon: Lollie Sails, MD;  Location: [facility];  Service: Endoscopy;  Laterality: N/A;  . ESOPHAGOGASTRODUODENOSCOPY (EGD) WITH PROPOFOL N/A 07/24/2015   Procedure: ESOPHAGOGASTRODUODENOSCOPY (EGD) WITH PROPOFOL;  Surgeon: Lucilla Lame, MD;  Location: [facility];  Service: Endoscopy;  Laterality: N/A;  . JOINT REPLACEMENT  2014   Bilateral Knee replacement  . PACEMAKER INSERTION Left 07/01/2015   Procedure: INSERTION PACEMAKER;  Surgeon: Isaias Cowman, MD;  Location: [facility];  Service: Cardiovascular;  Laterality: Left;  . REPLACEMENT TOTAL KNEE BILATERAL    . SHOULDER ARTHROSCOPY WITH SUBACROMIAL DECOMPRESSION Left  2013    Medications: Prior to Admission medications   Medication Sig Start Date End Date Taking? Authorizing Provider  acetaminophen (TYLENOL) 500 MG tablet Take 500-1,000 mg by mouth every 6 (six) hours as needed for mild pain or moderate pain.   Yes  [provider]  allopurinol (ZYLOPRIM) 100 MG tablet Take 150 mg by mouth daily.   Yes [provider]  amiodarone (PACERONE) 100 MG tablet Take 100 mg by mouth daily.   Yes [provider]  APPLE CIDER VINEGAR PO Take 1 tablet by mouth 2 (two) times daily.   Yes [provider]  aspirin EC 81 MG tablet Take 81 mg by mouth at bedtime.   Yes [provider]  busPIRone (BUSPAR) 10 MG tablet Take 10 mg by mouth 2 (two) times daily.   Yes [provider]  calcitRIOL (ROCALTROL) 0.25 MCG capsule Take 0.25 mcg by mouth daily.  11/07/16  Yes [provider]  colchicine 0.6 MG tablet Take 0.6 mg by mouth daily as needed (gout).    Yes [provider]  Cranberry 500 MG CAPS Take 500 mg by mouth daily.    Yes [provider]  hydrocortisone 2.5 % cream Apply 1 application topically at bedtime as needed (rash).    Yes [provider]  Hypromellose (ARTIFICIAL TEARS OP) Apply 1 drop to eye daily as needed (dry eyes).   Yes [provider]  insulin NPH-regular Human (NOVOLIN 70/30) (70-30) 100 UNIT/ML injection Inject 22 Units into the skin 2 (two) times daily before a meal.    Yes [provider]  ketoconazole (NIZORAL) 2 % cream Apply 1 application topically every 2 (two) hours as needed (for rash under breasts).    Yes [provider]  metoprolol tartrate (LOPRESSOR) 25 MG tablet Take 12.5 mg by mouth 2 (two) times daily.   Yes [provider]  Misc Natural Products (TART CHERRY ADVANCED PO) Take 1 capsule by mouth daily.   Yes [provider]  mupirocin cream (BACTROBAN) 2 % Apply 1 application topically daily as needed (rash).  01/15/16  Yes [provider]  oxybutynin (DITROPAN) 5 MG tablet Take 10 tablets by mouth at bedtime.  12/28/15  Yes [provider]  pantoprazole (PROTONIX) 40 MG tablet Take 40 mg by mouth 2 (two) times daily.   Yes [provider]  PARoxetine (PAXIL) 20 MG tablet Take 20 mg by mouth daily.   Yes [provider]  pravastatin (PRAVACHOL) 20 MG tablet Take 20 mg by mouth at bedtime.    Yes [provider]  talc (ZEASORB) powder Apply 1 application topically as needed (for rash under breasts).   Yes [provider]  traMADol (ULTRAM) 50 MG tablet Take 50 mg by mouth as needed.  01/17/17  Yes [provider]  traZODone (DESYREL) 50 MG tablet Take 100 tablets by mouth at bedtime.  12/28/15  Yes [provider]  docusate sodium (COLACE) 100 MG capsule Take 100 mg by mouth at bedtime.     [provider]  furosemide (LASIX) 20 MG tablet Take 20 mg by mouth every other day. 04/11/16   [provider]  HYDROcodone-acetaminophen (NORCO) 5-325 MG tablet Take 1-2 tablets by mouth every 4 (four) hours as needed for moderate pain. Patient not taking: Reported on 06/11/2017 09/14/16   Dereck Leep, MD    Allergies:   Allergies  Allergen Reactions  . Cephalosporins Other (See Comments)    Reaction:  Unknown   . Hydrocodone-Acetaminophen Hives  . Macrolides And Ketolides Other (See Comments)    Reaction:  Unknown   . Meperidine Shortness Of Breath, Nausea Only and Other (See Comments)    Reaction:  Stomach pain   . Prednisone Anaphylaxis and Other (See Comments)    Reaction:  Increases pts blood sugar  Pt states that she is allergic to all steroids.    . Shellfish Allergy Shortness Of Breath, Diarrhea, Nausea Only, Rash and Other (See Comments)    Reaction:  Stomach pain   . Sulfa Antibiotics Shortness Of Breath, Diarrhea, Nausea Only and Other (See Comments)    Reaction:  Stomach pain   . Uloric [Febuxostat] Anaphylaxis    Locks pt's body up Locks pt's body up  . Aspirin Other (See Comments)    Reaction:  Stomach pain   . Celecoxib Other (See Comments)    Reaction:  GI bleed, weakness, and stomach pain.    . Cephalexin Diarrhea, Nausea Only and Other  (See Comments)    Reaction:  Stomach pain   . Erythromycin Diarrhea, Nausea Only and Other (See Comments)    Reaction:  Stomach pain   . Hydromorphone Other (See Comments)    Reaction:  Hypotension   . Iodinated Diagnostic Agents Rash and Other (See Comments)    Pt states that she is unable to have because she has chronic kidney disease.    Marland Kitchen Oxycodone Other (See Comments)    Reaction:  Stomach pain   . Atorvastatin Nausea Only and Other (See Comments)    Reaction:  Weakness   . Codeine Diarrhea, Nausea Only and Other (See Comments)    Reaction:  Stomach pain   . Tape Other (See Comments)    Reaction:  Causes pts skin to tear  Pt states that she is able to use paper tape.    . Valdecoxib Swelling and Other (See Comments)    Pt states that her hands and feet swell.    . Iodine Rash    Social History:  reports that she has never smoked. She has never used smokeless tobacco. She reports that she does not drink alcohol or use drugs.  Family History: Family History  Problem Relation Age of Onset  . Diabetes Mellitus II Mother   . CAD Mother   . Heart attack Mother   . Cancer Father        skin  . Breast cancer Neg Hx     Physical Exam: Vitals:   06/11/17 2030 06/11/17 2130 06/11/17 2200 06/11/17 2230  BP: (!) 108/53 (!) 110/49 (!) 92/42 (!) 111/48  Pulse: 62 61 63 62  Resp: 14  (!) 25 (!) 9  Temp:      SpO2: 100% 100% 100% 100%  Weight:      Height:        General:  Alert and oriented times three, well developed and nourished, no acute distress Eyes: PERRLA, pink conjunctiva, no scleral icterus ENT: Moist oral mucosa, neck supple, no thyromegaly Lungs: clear to ascultation, no wheeze, no crackles, no use of accessory muscles Cardiovascular: regular rate and rhythm, no regurgitation, no gallops, no murmurs. No carotid bruits, no JVD Abdomen: soft, positive BS, non-tender, non-distended, no organomegaly, not an acute abdomen GU: not examined Neuro: CN II - XII grossly  intact, sensation intact Musculoskeletal: strength 5/5 all extremities, no clubbing, cyanosis or edema Skin: no rash, no subcutaneous crepitation, no decubitus Psych: appropriate patient   Labs on Admission:  Recent Labs  06/11/17 1950  NA 137  K 3.8  CL 101  CO2 25  GLUCOSE 132*  BUN 31*  CREATININE 1.67*  CALCIUM 9.3    Recent Labs  06/11/17 1950  AST 49*  ALT 28  ALKPHOS 129*  BILITOT 1.7*  PROT 7.1  ALBUMIN 3.9   No results for input(s): LIPASE, AMYLASE in the last 72 hours.  Recent Labs  06/11/17 1950  WBC 5.2  NEUTROABS 2.5  HGB 13.7  HCT 40.3  MCV 98.4  PLT 151    Recent Labs  06/11/17 1950 06/11/17 2159  TROPONINI <0.03 <0.03   Invalid input(s): POCBNP No results for input(s): DDIMER in the last 72 hours. No results for input(s): HGBA1C in the last 72 hours. No results for input(s): CHOL, HDL, LDLCALC, TRIG, CHOLHDL, LDLDIRECT in the last 72 hours. No results for input(s): TSH, T4TOTAL, T3FREE, THYROIDAB in the last 72 hours.  Invalid input(s): FREET3 No results for input(s): VITAMINB12, FOLATE, FERRITIN, TIBC, IRON, RETICCTPCT in the last 72 hours.  Micro Results: No results found for this or any previous visit (from the past 240 hour(s)).   Radiological Exams on Admission: Ct Head Wo Contrast  Result Date: 06/11/2017 CLINICAL DATA:  Chronic headache. EXAM: CT HEAD WITHOUT CONTRAST TECHNIQUE: Contiguous axial images were obtained from the base of the skull through the vertex without intravenous contrast. COMPARISON:  CT scan of December 23, 2013. FINDINGS: Brain: Mild chronic ischemic white matter disease is noted. No mass effect or midline shift is noted. Ventricular size is within normal limits. There is no evidence of mass lesion, hemorrhage or acute infarction. Vascular: No hyperdense vessel or unexpected calcification. Skull: Normal. Negative for fracture or focal lesion. Sinuses/Orbits: No acute finding. Other: None. IMPRESSION: Mild  chronic ischemic white matter disease. No acute intracranial abnormality seen. Electronically Signed   By: Marijo Conception, M.D.   On: 06/11/2017 21:46   Dg Chest Port 1 View  Result Date: 06/11/2017 CLINICAL DATA:  Chest pain. EXAM: PORTABLE CHEST 1 VIEW COMPARISON:  Radiograph of Jan 26, 2016. FINDINGS: The heart size and mediastinal contours are within normal limits. Atherosclerosis of thoracic aorta is noted. Single lead left-sided pacemaker is unchanged in position. No pneumothorax or pleural effusion is noted. Both lungs are clear. The visualized skeletal structures are unremarkable. IMPRESSION: No acute cardiopulmonary abnormality seen.  Aortic atherosclerosis. Electronically Signed   By: Marijo Conception, M.D.   On: 06/11/2017 19:40    Assessment/Plan Present on Admission: dizziness -bring in for monitoring on med surgery cardiac monitoring -most likely due to hypotension, orthostatic vitals ordered, patient is 67/50 when laying, 83/73 when sitting -1 L normal saline bolus ordered, will recheck orthostatic vitals post bolus -Neurology Consulted, Recommends workup for posterior circulation CVA. Workup was initiated, will reevaluate post bolus. If still needed will defer to AM team -Will order a UA. Chest x-ray negative, no leukocytosis. No evidence of infection -Of note patient unable to obtain MRI, she has a pacemaker in place  Chest Tightness -cardiac monitoring, cycle cardiac enzymes. -Consult cardiology, per patient request -continue aspirin, beta blockers with hold parameters -deferred to cardiology and a workup.  . Atrial fibrillation ([place]) -stable, home medications resumed  . Benign essential HTN -home medications resumed with hold parameters. Lasix held for now  . Chronic kidney disease, stage 3, mod decreased GFR (HCC) -stable, at baseline  . 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 Coronary artery disease involving native coronary artery of native heart without angina pectoris -see above  . Hyperlipemia, mixed -stable, home meds resumed    Harmonii Karle 06/12/2017, 12:33 AM

## 2017-06-12 NOTE — ED Notes (Signed)
Report called and given to [place], Therapist, sports.

## 2017-06-12 NOTE — ED Notes (Signed)
Patient having [place] with Neuro.

## 2017-06-12 NOTE — ED Notes (Signed)
Pt transport to 103

## 2017-06-12 NOTE — Progress Notes (Signed)
[facility] NAME: Kameelah Minish    MR#:  [id number]  DATE OF BIRTH:  1941/08/23  SUBJECTIVE: admitted for dizziness. Found to have orthostatic hypotension. She also has right facial numbness so we need CAT scan of the head to times and did not show any stroke. Unable to get MRI of the brain because of pacemaker. She says she feels better today. Still has dizziness when she gets up from bed.  CHIEF COMPLAINT:   Chief Complaint  Patient presents with  . Hypotension  . Dizziness    REVIEW OF SYSTEMS:   ROS CONSTITUTIONAL: No fever, fatigue or weakness.  EYES: No blurred or double vision.  EARS, NOSE, AND THROAT: No tinnitus or ear pain.  RESPIRATORY: No cough, shortness of breath, wheezing or hemoptysis.  CARDIOVASCULAR: No chest pain, orthopnea, edema.  GASTROINTESTINAL: No nausea, vomiting, diarrhea or abdominal pain.  GENITOURINARY: No dysuria, hematuria.  ENDOCRINE: No polyuria, nocturia,  HEMATOLOGY: No anemia, easy bruising or bleeding SKIN: No rash or lesion. MUSCULOSKELETAL: No joint pain or arthritis.   NEUROLOGIC: No tingling, numbness, weakness. Has dizziness. PSYCHIATRY: No anxiety or depression.   DRUG ALLERGIES:   Allergies  Allergen Reactions  . Cephalosporins Other (See Comments)    Reaction:  Unknown   . Hydrocodone-Acetaminophen Hives  . Macrolides And Ketolides Other (See Comments)    Reaction:  Unknown   . Meperidine Shortness Of Breath, Nausea Only and Other (See Comments)    Reaction:  Stomach pain   . Prednisone Anaphylaxis and Other (See Comments)    Reaction:  Increases pts blood sugar  Pt states that she is allergic to all steroids.    . Shellfish Allergy Shortness Of Breath, Diarrhea, Nausea Only, Rash and Other (See Comments)    Reaction:  Stomach pain   . Sulfa Antibiotics Shortness Of Breath, Diarrhea, Nausea Only and Other (See Comments)    Reaction:  Stomach pain   . Uloric  [Febuxostat] Anaphylaxis    Locks pt's body up Locks pt's body up  . Aspirin Other (See Comments)    Reaction:  Stomach pain   . Celecoxib Other (See Comments)    Reaction:  GI bleed, weakness, and stomach pain.    . Cephalexin Diarrhea, Nausea Only and Other (See Comments)    Reaction:  Stomach pain   . Erythromycin Diarrhea, Nausea Only and Other (See Comments)    Reaction:  Stomach pain   . Hydromorphone Other (See Comments)    Reaction:  Hypotension   . Iodinated Diagnostic Agents Rash and Other (See Comments)    Pt states that she is unable to have because she has chronic kidney disease.    Marland Kitchen Oxycodone Other (See Comments)    Reaction:  Stomach pain   . Atorvastatin Nausea Only and Other (See Comments)    Reaction:  Weakness   . Codeine Diarrhea, Nausea Only and Other (See Comments)    Reaction:  Stomach pain   . Tape Other (See Comments)    Reaction:  Causes pts skin to tear  Pt states that she is able to use paper tape.    . Valdecoxib Swelling and Other (See Comments)    Pt states that her hands and feet swell.    . Iodine Rash    VITALS:  Blood pressure (!) 95/45, pulse 64, temperature 98.2 F (36.8 C), temperature source Oral, resp. rate 20, height 5\' 4"  (1.626 m), weight 109.8 kg (242 lb),  SpO2 100 %.  PHYSICAL EXAMINATION:  GENERAL:  76 y.o.-year-old patient lying in the bed with no acute distress.  EYES: Pupils equal, round, reactive to light and accommodation. No scleral icterus. Extraocular muscles intact.  HEENT: Head atraumatic, normocephalic. Oropharynx and nasopharynx clear.  NECK:  Supple, no jugular venous distention. No thyroid enlargement, no tenderness.  LUNGS: Normal breath sounds bilaterally, no wheezing, rales,rhonchi or crepitation. No use of accessory muscles of respiration.  CARDIOVASCULAR: S1, S2 normal. No murmurs, rubs, or gallops.  ABDOMEN: Soft, nontender, nondistended. Bowel sounds present. No organomegaly or mass.  EXTREMITIES: No pedal  edema, cyanosis, or clubbing.  NEUROLOGIC: Cranial nerves II through XII are intact. Muscle strength 5/5 in all extremities. Sensation intact. Gait not checked. right facial numbness. No numbness on any side of the body. PSYCHIATRIC: The patient is alert and oriented x 3.  SKIN: No obvious rash, lesion, or ulcer.    LABORATORY PANEL:   CBC  Recent Labs Lab 06/12/17 0427  WBC 5.2  HGB 13.5  HCT 39.4  PLT 135*   ------------------------------------------------------------------------------------------------------------------  Chemistries   Recent Labs Lab 06/11/17 1950 06/12/17 0427  NA 137 139  K 3.8 3.6  CL 101 103  CO2 25 24  GLUCOSE 132* 123*  BUN 31* 30*  CREATININE 1.67* 1.72*  CALCIUM 9.3 8.8*  AST 49*  --   ALT 28  --   ALKPHOS 129*  --   BILITOT 1.7*  --    ------------------------------------------------------------------------------------------------------------------  Cardiac Enzymes  Recent Labs Lab 06/12/17 1055  TROPONINI <0.03   ------------------------------------------------------------------------------------------------------------------  RADIOLOGY:  Ct Head Wo Contrast  Result Date: 06/12/2017 CLINICAL DATA:  Left-sided facial numbness.  Headache. EXAM: CT HEAD WITHOUT CONTRAST TECHNIQUE: Contiguous axial images were obtained from the base of the skull through the vertex without intravenous contrast. COMPARISON:  June 11, 2017 FINDINGS: Brain: The ventricles are normal in size and configuration. There is no intracranial mass, hemorrhage, extra-axial fluid collection, or midline shift. Patchy small vessel disease in the centra semiovale bilaterally is stable. There is no new gray-white compartment lesion. No acute infarct is evident. Vascular: There is no appreciable hyperdense vessel. There is calcification in each carotid siphon. Skull: The bony calvarium appears intact. Moderate hyperostosis is noted, stable. Sinuses/Orbits: Visualized  paranasal sinuses are clear. Visualized orbits appear symmetric bilaterally. Other: Visualized mastoid air cells are clear.  Caps IMPRESSION: Stable mild cerebellar atrophy. Patchy periventricular small vessel disease. No intracranial mass, hemorrhage, or extra-axial fluid collection. No acute infarct evident. Foci of arterial vascular calcification noted. Electronically Signed   By: Lowella Grip III M.D.   On: 06/12/2017 10:42   Ct Head Wo Contrast  Result Date: 06/11/2017 CLINICAL DATA:  Chronic headache. EXAM: CT HEAD WITHOUT CONTRAST TECHNIQUE: Contiguous axial images were obtained from the base of the skull through the vertex without intravenous contrast. COMPARISON:  CT scan of December 23, 2013. FINDINGS: Brain: Mild chronic ischemic white matter disease is noted. No mass effect or midline shift is noted. Ventricular size is within normal limits. There is no evidence of mass lesion, hemorrhage or acute infarction. Vascular: No hyperdense vessel or unexpected calcification. Skull: Normal. Negative for fracture or focal lesion. Sinuses/Orbits: No acute finding. Other: None. IMPRESSION: Mild chronic ischemic white matter disease. No acute intracranial abnormality seen. Electronically Signed   By: Marijo Conception, M.D.   On: 06/11/2017 21:46   US Carotid Bilateral  Result Date: 06/12/2017 CLINICAL DATA:  76 year old female with a history of numbness and tingling.  Cardiovascular risk factors include hypertension, prior stroke/ TIA, known coronary artery disease, hyperlipidemia, diabetes EXAM: BILATERAL CAROTID DUPLEX ULTRASOUND TECHNIQUE: Pearline Cables scale imaging, color Doppler and duplex ultrasound were performed of bilateral carotid and vertebral arteries in the neck. COMPARISON:  No prior duplex FINDINGS: Criteria: Quantification of carotid stenosis is based on velocity parameters that correlate the residual internal carotid diameter with NASCET-based stenosis levels, using the diameter of the distal  internal carotid lumen as the denominator for stenosis measurement. The following velocity measurements were obtained: RIGHT ICA:  Systolic 737 cm/sec, Diastolic 22 cm/sec CCA:  98 cm/sec SYSTOLIC ICA/CCA RATIO:  1.1 ECA:  163 cm/sec LEFT ICA:  Systolic 106 cm/sec, Diastolic 18 cm/sec CCA:  269 cm/sec SYSTOLIC ICA/CCA RATIO:  0.8 ECA:  121 cm/sec Right Brachial SBP: Not acquired Left Brachial SBP: Not acquired RIGHT CAROTID ARTERY: No significant calcifications of the right common carotid artery. Intermediate waveform maintained. Heterogeneous and partially calcified plaque at the right carotid bifurcation. No significant lumen shadowing. Low resistance waveform of the right ICA. No significant tortuosity. RIGHT VERTEBRAL ARTERY: Antegrade flow with low resistance waveform. LEFT CAROTID ARTERY: No significant calcifications of the left common carotid artery. Intermediate waveform maintained. Heterogeneous and partially calcified plaque at the left carotid bifurcation without significant lumen shadowing. Low resistance waveform of the left ICA. No significant tortuosity. LEFT VERTEBRAL ARTERY:  Antegrade flow with low resistance waveform. IMPRESSION: Color duplex indicates minimal heterogeneous and calcified plaque, with no hemodynamically significant stenosis by duplex criteria in the extracranial cerebrovascular circulation. Signed, Dulcy Fanny. Earleen Newport, DO [facility] Electronically Signed   By: Corrie Mckusick D.O.   On: 06/12/2017 10:30   Dg Chest Port 1 View  Result Date: 06/11/2017 CLINICAL DATA:  Chest pain. EXAM: PORTABLE CHEST 1 VIEW COMPARISON:  Radiograph of Jan 26, 2016. FINDINGS: The heart size and mediastinal contours are within normal limits. Atherosclerosis of thoracic aorta is noted. Single lead left-sided pacemaker is unchanged in position. No pneumothorax or pleural effusion is noted. Both lungs are clear. The visualized skeletal structures  are unremarkable. IMPRESSION: No acute cardiopulmonary abnormality seen.  Aortic atherosclerosis. Electronically Signed   By: Marijo Conception, M.D.   On: 06/11/2017 19:40    EKG:   Orders placed or performed during the hospital encounter of 06/11/17  . ED EKG within 10 minutes  . ED EKG within 10 minutes    ASSESSMENT AND PLAN:   #1 dizziness likely due to orthostatic hypotension secondary to diuretics: Stop with the Lasix. Advised to stop Lasix at discharge also. Continue gentle hydration for dehydration. #2 right facial numbness likely transient: CT head unremarkable for stroke. Physical therapy to see the patient today denies any weakness in legs. Has generalized weakness in legs but nothing suggestive of stroke. #3.chest tightness: Troponins are negative. Cardiology is supposed to see because of hypotension episode and dizziness. Patient sees Dr. Nehemiah Massed. For chronic atrial fibrillation: Slightly hypotensive today so we held amiodarone this morning. On metoprolol.Cardiology to follow.she haspacemaker. #5/ essential hypertension: Hold Lasix due to orthostatic hypotension. Possible discharge tomorrow.    All the records are reviewed and case discussed with Care Management/Social Workerr. Management plans discussed with the patient, family and they are in agreement.  CODE STATUS: full  TOTAL TIME TAKING CARE OF THIS PATIENT: 35 minutes.   POSSIBLE D/C IN 1-2DAYS, DEPENDING ON CLINICAL CONDITION.   Epifanio Lesches M.D on 06/12/2017 at 12:54 PM  Between 7am to 6pm - Pager - [phone]  After  6pm go to [email or web address] - password [email or web address] [facility]  [facility]  Office  [phone]  CC: Primary care physician; [name], MD   Note: This dictation was prepared with Dragon dictation along with smaller phrase technology. Any transcriptional errors that result from this process are unintentional.

## 2017-06-12 NOTE — Progress Notes (Signed)
76 y/o patient admitted for dizziness and has been ordered lovenox 30 mg subq daily for VTE prophylaxis.  CrCl 34.7 ml/min BMI 41.5  Will switch patient to lovenox 40 mg subq daily considering CrCl is near 30 ml/min and BMI > 40.  Tobie Lords, PharmD, BCPS Clinical Pharmacist 06/12/2017

## 2017-06-12 NOTE — ED Notes (Signed)
[place] camera set up in room for Neuro consult.

## 2017-06-12 NOTE — ED Notes (Signed)
Admission nurse notified of orthostatic changes and new orders.

## 2017-06-13 DIAGNOSIS — E860 Dehydration: Secondary | ICD-10-CM | POA: Diagnosis not present

## 2017-06-13 DIAGNOSIS — R0602 Shortness of breath: Secondary | ICD-10-CM | POA: Diagnosis not present

## 2017-06-13 LAB — GLUCOSE, CAPILLARY: Glucose-Capillary: 184 mg/dL — ABNORMAL HIGH (ref 65–99)

## 2017-06-13 MED ORDER — OXYBUTYNIN CHLORIDE 5 MG PO TABS: 2.5000 mg | ORAL_TABLET | Freq: Every day | ORAL | 0 refills | Status: DC

## 2017-06-13 MED ORDER — MECLIZINE HCL 12.5 MG PO TABS: 12.5000 mg | ORAL_TABLET | Freq: Two times a day (BID) | ORAL | 0 refills | Status: DC

## 2017-06-13 MED ORDER — MECLIZINE HCL 12.5 MG PO TABS
12.5000 mg | ORAL_TABLET | Freq: Two times a day (BID) | ORAL | Status: DC
  Administered 2017-06-13: 12.5 mg via ORAL
  Filled 2017-06-13: qty 1

## 2017-06-13 MED ORDER — TRAZODONE HCL 50 MG PO TABS: 50.0000 mg | ORAL_TABLET | Freq: Every day | ORAL | 0 refills | Status: DC

## 2017-06-13 NOTE — Evaluation (Signed)
Physical Therapy Evaluation Patient Details Name: Deborah Obrien MRN: [id number] DOB: Dec 07, 1940 Today's Date: 06/13/2017   History of Present Illness  This is a 76 year old female with multiple comorbidities. She states on Monday she saw her cardiologist and her dermatologist. Her dermatologist prescribed doxycycline to which she had a poor response including abdominal pain and diarrhea. She discontinued the medication. On Friday she the flu shot. On Sunday she started feeling dizzy, she checked her blood pressure. Her lowest blood pressure was 83/47. She had associated nausea but no vomiting. She reports a headache. She went on to develop chest tightness. She later states the left side of her face felt as it was trying to go to sleep. Her dizziness kept recurring. She reports numbness on the lips. She reports chills but no fevers. She denies any burning on urination. She came to the ER. In the ER teleneurology has been consulted. She was admitted for dizziness and R facial numbness. Head CT negative for acute CVA  Clinical Impression  Pt admitted with above diagnosis. Pt currently with functional limitations due to the deficits listed below (see PT Problem List).  Pt is modified independent for bed mobility and transfers. She is able to ambulate partially around the RN station with therapy. VSS throughout ambulation and pt denies dizziness with ambulation. Decreased speed and step length but close to baseline. Pt reports feeling significantly improved from time of admission. No presyncopal symptoms. +2 present for added safety. Pt reports LLE numbness which is chronic prior to admission but otherwise no focal neurological deficits identified. No nystagmus and EOM intact. Face symmetrical. Pt does report some increase in R sided facial numbness persisting since admission when she sits up. Orthostatics negative from sitting to standing. No PT needs following discharge. Pt will benefit from PT services to  address deficits in strength, balance, and mobility in order to return to full function at home.     Follow Up Recommendations No PT follow up    Equipment Recommendations  Rolling walker with 5" wheels    Recommendations for Other Services       Precautions / Restrictions Precautions Precautions: Fall Restrictions Weight Bearing Restrictions: No      Mobility  Bed Mobility Overal bed mobility: Modified Independent             General bed mobility comments: Use of bed rails and HOB elevated  Transfers Overall transfer level: Modified independent Equipment used: Rolling walker (2 wheeled)             General transfer comment: Pt demonstrates fair speed, sequencing with transfers. No dizziness upon standing today. Vitals obtained and orthostatics from sitting to standing are negative  Ambulation/Gait Ambulation/Gait assistance: Min guard Ambulation Distance (Feet): 150 Feet Assistive device: Rolling walker (2 wheeled) Gait Pattern/deviations: Decreased step length - right;Decreased step length - left Gait velocity: Decreased Gait velocity interpretation: <1.8 ft/sec, indicative of risk for recurrent falls General Gait Details: Pt able to ambulate partially around the RN station with therapy. VSS throughout ambulation and pt denies dizziness with ambulation. Decreased speed and step length but close to baseline. Pt reports feeling significantly improved from time of admission. No presyncopal symptoms. +2 present for added safety  Stairs            Wheelchair Mobility    Modified Rankin (Stroke Patients Only)       Balance Overall balance assessment: Needs assistance Sitting-balance support: No upper extremity supported Sitting balance-Leahy Scale: Good  Standing balance support: No upper extremity supported Standing balance-Leahy Scale: Fair                               Pertinent Vitals/Pain Pain Assessment: 0-10 Pain Score: 6   Pain Location: Lower back pain, chronic prior to admission Pain Descriptors / Indicators: Aching Pain Intervention(s): Monitored during session;Other (comment) (Pt declining pain meds)    Home Living Family/patient expects to be discharged to:: Private residence Living Arrangements: Spouse/significant other Available Help at Discharge: Family Type of Home: House Home Access: [place]: One [place]: Environmental consultant - 4 wheels;Cane - single point;Tub bench;Bedside commode;Grab bars - tub/shower Additional Comments: Lift recliner    Prior Function Level of Independence: Independent with assistive device(s)         Comments: Uses rollator for community ambulation. Furniture walks at home. 2 falls in the last 12 monhts     Hand Dominance   Dominant Hand: Right    Extremity/Trunk Assessment   Upper Extremity Assessment Upper Extremity Assessment: Overall WFL for tasks assessed    Lower Extremity Assessment Lower Extremity Assessment: LLE deficits/detail LLE Deficits / Details: Strength grossly symmetrical. Pt reports chronic decreased sensation throughout entire LLE       Communication   Communication: No difficulties  Cognition Arousal/Alertness: Awake/alert Behavior During Therapy: WFL for tasks assessed/performed Overall Cognitive Status: Within Functional Limits for tasks assessed                                        General Comments      Exercises     Assessment/Plan    PT Assessment Patient needs continued PT services  PT Problem List Decreased strength;Decreased activity tolerance;Decreased balance;Decreased mobility       PT Treatment Interventions DME instruction;Gait training;Stair training;Functional mobility training;Therapeutic activities;Therapeutic exercise;Balance training;Neuromuscular re-education;Patient/family education    PT Goals (Current goals can be found in the Care Plan section)  Acute  Rehab PT Goals Patient Stated Goal: Return home PT Goal Formulation: With patient Time For Goal Achievement: 06/27/17 Potential to Achieve Goals: Good    Frequency Min 2X/week   Barriers to discharge        Co-evaluation               AM-PAC PT "6 Clicks" Daily Activity  Outcome Measure Difficulty turning over in bed (including adjusting bedclothes, sheets and blankets)?: None Difficulty moving from lying on back to sitting on the side of the bed? : None Difficulty sitting down on and standing up from a chair with arms (e.g., wheelchair, bedside commode, etc,.)?: A Little Help needed moving to and from a bed to chair (including a wheelchair)?: A Little Help needed walking in hospital room?: A Little Help needed climbing 3-5 steps with a railing? : A Little 6 Click Score: 20    End of Session Equipment Utilized During Treatment: Gait belt Activity Tolerance: Patient tolerated treatment well Patient left: in chair;with call bell/phone within reach;with chair alarm set   PT Visit Diagnosis: Unsteadiness on feet (R26.81);Muscle weakness (generalized) (M62.81);History of falling (Z91.81);Dizziness and giddiness (R42)    Time: 5643-3295 PT Time Calculation (min) (ACUTE ONLY): 35 min   Charges:   PT Evaluation $PT Eval Low Complexity: 1 Low PT Treatments $Gait Training: 8-22 mins   PT G Codes:  PT G-Codes **NOT FOR INPATIENT CLASS** Functional Assessment Tool Used: AM-PAC 6 Clicks Basic Mobility Functional Limitation: Mobility: Walking and moving around Mobility: Walking and Moving Around Current Status (K4753): At least 20 percent but less than 40 percent impaired, limited or restricted Mobility: Walking and Moving Around Goal Status [phone]): At least 1 percent but less than 20 percent impaired, limited or restricted    Phillips Grout PT, DPT    Johnika Escareno 06/13/2017, 11:29 AM

## 2017-06-13 NOTE — Progress Notes (Signed)
Pt is being discharged today, IV and telemetry removed. Discharge instructions reviewed with the patient and her husband, both verified understanding. 0 paper prescriptions given to her. All belongings packed and returned to the patient. She was rolled out in a wheelchair by staff.

## 2017-06-16 NOTE — Discharge Summary (Signed)
Deborah Obrien, is a 76 y.o. female  DOB [birth year]  MRN [id number].  Admission date:  06/11/2017  Admitting Physician  Quintella Baton, MD  Discharge Date:  06/16/2017   Primary MD  [name], MD  Recommendations for primary care physician for things to follow:  Follow up with PCP in one week   Admission Diagnosis  Shortness of breath [R06.02] Vertigo [R42] Transient cerebral ischemia, unspecified type [G45.9]   Discharge Diagnosis  Shortness of breath [R06.02] Vertigo [R42] Transient cerebral ischemia, unspecified type [G45.9]    Principal Problem:   Dizziness Active Problems:   Atrial fibrillation (HCC)   Benign essential HTN   Chronic kidney disease, stage 3, mod decreased GFR (HCC)   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 A-fib ([place]) 2016  . Acid reflux   . Anxiety   . Bowel obstruction ([place])   . CHF (congestive heart failure) ([place]) 2017  . Chronic kidney disease (CKD) stage G3a/A1, moderately decreased glomerular filtration rate (GFR) between 45-59 mL/min/1.73 square meter and albuminuria creatinine ratio less than 30 mg/g (HCC)   . Diabetes mellitus without complication ([place])   . Dyspnea   . Fatty liver   . GI bleed   . Gout   . Hypertension   . Morbid obesity ([place])   . Osteoarthritis   . Osteoporosis   . Paroxysmal atrial fibrillation (HCC)   . Presence of permanent cardiac pacemaker   . TIA (transient ischemic attack)     Past Surgical History:  Procedure Laterality Date  . ABDOMINAL HYSTERECTOMY  1980  . APPENDECTOMY    . BACK SURGERY     2008  .  BREAST BIOPSY Right 08/16   stereo fibroadenomatous change, neg for atypia  . BREAST EXCISIONAL BIOPSY Left    neg  . CARDIAC CATHETERIZATION Left 08/25/2016   Procedure: Left Heart Cath and Coronary Angiography;  Surgeon: Corey Skains, MD;  Location: [facility];  Service: Cardiovascular;  Laterality: Left;  . CATARACT EXTRACTION, BILATERAL  2012  . CHOLECYSTECTOMY    . colon blockage  1999  . COLON SURGERY  1999  . COLONOSCOPY  2016   polyps removed 2016  . DORSAL COMPARTMENT RELEASE Left 09/14/2016   Procedure: RELEASE DORSAL COMPARTMENT ([place]);  Surgeon: Dereck Leep, MD;  Location: [facility];  Service: Orthopedics;  Laterality: Left;  . ESOPHAGOGASTRODUODENOSCOPY N/A 01/27/2015   Procedure: ESOPHAGOGASTRODUODENOSCOPY (EGD);  Surgeon: Lollie Sails, MD;  Location: [facility];  Service: Endoscopy;  Laterality: N/A;  . ESOPHAGOGASTRODUODENOSCOPY (EGD) WITH PROPOFOL N/A 07/24/2015   Procedure: ESOPHAGOGASTRODUODENOSCOPY (EGD) WITH PROPOFOL;  Surgeon: Lucilla Lame, MD;  Location: [facility];  Service: Endoscopy;  Laterality: N/A;  . JOINT REPLACEMENT  2014   Bilateral Knee replacement  . PACEMAKER INSERTION Left 07/01/2015   Procedure: INSERTION PACEMAKER;  Surgeon: Isaias Cowman, MD;  Location: [facility];  Service: Cardiovascular;  Laterality: Left;  . REPLACEMENT TOTAL KNEE BILATERAL    . SHOULDER ARTHROSCOPY WITH SUBACROMIAL DECOMPRESSION Left 2013       History of present illness and  Hospital Course:     Kindly see H&P for history of present illness and admission details, please review complete Labs, Consult reports and Test reports for all details in  brief  HPI  from the history and physical done on the day of admission 76 yr old female with dizziness/right facial numbness.admitted to stroke unit for eval of stroke.   Hospital Course  Dizziness/rigght facial numbness;CT head negative for stroke.unable to get MRI brain due to pacmaker. CT  head  Repeated 2 times,and  It did not show any strokes*dizziness improved.stopped lasix as it can cause dehydration. 2.transient dizziness;resolved. Troponin is negative for 3 times 3.chronic  proxysmalafib;on meoprolol 4.Marland KitchenDMII;continue home dose insulin. 5.ckd stage 3;stable   Discharge Condition: stable  Follow UP      Discharge Instructions  and  Discharge Medications     Allergies as of 06/13/2017      Reactions   Cephalosporins Other (See Comments)   Reaction:  Unknown    Hydrocodone-acetaminophen Hives   Macrolides And Ketolides Other (See Comments)   Reaction:  Unknown    Meperidine Shortness Of Breath, Nausea Only, Other (See Comments)   Reaction:  Stomach pain    Prednisone Anaphylaxis, Other (See Comments)   Reaction:  Increases pts blood sugar  Pt states that she is allergic to all steroids.     Shellfish Allergy Shortness Of Breath, Diarrhea, Nausea Only, Rash, Other (See Comments)   Reaction:  Stomach pain    Sulfa Antibiotics Shortness Of Breath, Diarrhea, Nausea Only, Other (See Comments)   Reaction:  Stomach pain    Uloric [febuxostat] Anaphylaxis   Locks pt's body up Locks pt's body up   Aspirin Other (See Comments)   Reaction:  Stomach pain    Celecoxib Other (See Comments)   Reaction:  GI bleed, weakness, and stomach pain.     Cephalexin Diarrhea, Nausea Only, Other (See Comments)   Reaction:  Stomach pain    Erythromycin Diarrhea, Nausea Only, Other (See Comments)   Reaction:  Stomach pain    Hydromorphone Other (See Comments)   Reaction:  Hypotension    Iodinated Diagnostic Agents Rash, Other (See Comments)   Pt states that she is unable to have because she has chronic kidney disease.     Oxycodone Other (See Comments)   Reaction:  Stomach pain    Atorvastatin Nausea Only, Other (See Comments)   Reaction:  Weakness    Codeine Diarrhea, Nausea Only, Other (See Comments)   Reaction:  Stomach pain    Tape Other (See Comments)   Reaction:   Causes pts skin to tear  Pt states that she is able to use paper tape.     Valdecoxib Swelling, Other (See Comments)   Pt states that her hands and feet swell.     Iodine Rash      Medication List    STOP taking these medications   furosemide 20 MG tablet Commonly known as:  LASIX   HYDROcodone-acetaminophen 5-325 MG tablet Commonly known as:  NORCO   metoprolol tartrate 25 MG tablet Commonly known as:  LOPRESSOR   traMADol 50 MG tablet Commonly known as:  ULTRAM     TAKE these medications   acetaminophen 500 MG tablet Commonly known as:  TYLENOL Take 500-1,000 mg by mouth every 6 (six) hours as needed for mild pain or moderate pain.   allopurinol 100 MG tablet Commonly known as:  ZYLOPRIM Take 150 mg by mouth daily.   amiodarone 100 MG tablet Commonly known as:  PACERONE Take 100 mg by mouth daily.   APPLE CIDER VINEGAR PO Take 1 tablet by mouth 2 (two) times daily.   ARTIFICIAL TEARS OP Apply  1 drop to eye daily as needed (dry eyes).   aspirin EC 81 MG tablet Take 81 mg by mouth at bedtime.   busPIRone 10 MG tablet Commonly known as:  BUSPAR Take 10 mg by mouth 2 (two) times daily.   calcitRIOL 0.25 MCG capsule Commonly known as:  ROCALTROL Take 0.25 mcg by mouth daily.   colchicine 0.6 MG tablet Take 0.6 mg by mouth daily as needed (gout).   Cranberry 500 MG Caps Take 500 mg by mouth daily.   docusate sodium 100 MG capsule Commonly known as:  COLACE Take 100 mg by mouth at bedtime.   hydrocortisone 2.5 % cream Apply 1 application topically at bedtime as needed (rash).   insulin NPH-regular Human (70-30) 100 UNIT/ML injection Commonly known as:  NOVOLIN 70/30 Inject 22 Units into the skin 2 (two) times daily before a meal.   ketoconazole 2 % cream Commonly known as:  NIZORAL Apply 1 application topically every 2 (two) hours as needed (for rash under breasts).   meclizine 12.5 MG tablet Commonly known as:  ANTIVERT Take 1 tablet (12.5 mg  total) by mouth 2 (two) times daily.   mupirocin cream 2 % Commonly known as:  BACTROBAN Apply 1 application topically daily as needed (rash).   oxybutynin 5 MG tablet Commonly known as:  DITROPAN Take 0.5 tablets (2.5 mg total) by mouth at bedtime. What changed:  how much to take   pantoprazole 40 MG tablet Commonly known as:  PROTONIX Take 40 mg by mouth 2 (two) times daily.   PARoxetine 20 MG tablet Commonly known as:  PAXIL Take 20 mg by mouth daily.   pravastatin 20 MG tablet Commonly known as:  PRAVACHOL Take 20 mg by mouth at bedtime.   TART CHERRY ADVANCED PO Take 1 capsule by mouth daily.   traZODone 50 MG tablet Commonly known as:  DESYREL Take 1 tablet (50 mg total) by mouth at bedtime. What changed:  how much to take   ZEASORB powder Generic drug:  talc Apply 1 application topically as needed (for rash under breasts).         Diet and Activity recommendation: See Discharge Instructions above   Consults obtained - cardiology   Major procedures and Radiology Reports - PLEASE review detailed and final reports for all details, in brief -     Ct Head Wo Contrast  Result Date: 06/12/2017 CLINICAL DATA:  Left-sided facial numbness.  Headache. EXAM: CT HEAD WITHOUT CONTRAST TECHNIQUE: Contiguous axial images were obtained from the base of the skull through the vertex without intravenous contrast. COMPARISON:  June 11, 2017 FINDINGS: Brain: The ventricles are normal in size and configuration. There is no intracranial mass, hemorrhage, extra-axial fluid collection, or midline shift. Patchy small vessel disease in the centra semiovale bilaterally is stable. There is no new gray-white compartment lesion. No acute infarct is evident. Vascular: There is no appreciable hyperdense vessel. There is calcification in each carotid siphon. Skull: The bony calvarium appears intact. Moderate hyperostosis is noted, stable. Sinuses/Orbits: Visualized paranasal sinuses are  clear. Visualized orbits appear symmetric bilaterally. Other: Visualized mastoid air cells are clear.  Caps IMPRESSION: Stable mild cerebellar atrophy. Patchy periventricular small vessel disease. No intracranial mass, hemorrhage, or extra-axial fluid collection. No acute infarct evident. Foci of arterial vascular calcification noted. Electronically Signed   By: Lowella Grip III M.D.   On: 06/12/2017 10:42   Ct Head Wo Contrast  Result Date: 06/11/2017 CLINICAL DATA:  Chronic headache. EXAM: CT HEAD  WITHOUT CONTRAST TECHNIQUE: Contiguous axial images were obtained from the base of the skull through the vertex without intravenous contrast. COMPARISON:  CT scan of December 23, 2013. FINDINGS: Brain: Mild chronic ischemic white matter disease is noted. No mass effect or midline shift is noted. Ventricular size is within normal limits. There is no evidence of mass lesion, hemorrhage or acute infarction. Vascular: No hyperdense vessel or unexpected calcification. Skull: Normal. Negative for fracture or focal lesion. Sinuses/Orbits: No acute finding. Other: None. IMPRESSION: Mild chronic ischemic white matter disease. No acute intracranial abnormality seen. Electronically Signed   By: Marijo Conception, M.D.   On: 06/11/2017 21:46   Ct Cervical Spine Wo Contrast  Result Date: 06/01/2017 CLINICAL DATA:  Chronic neck pain. Left arm numbness over the last 2 months. EXAM: CT CERVICAL SPINE WITHOUT CONTRAST TECHNIQUE: Multidetector CT imaging of the cervical spine was performed without intravenous contrast. Multiplanar CT image reconstructions were also generated. COMPARISON:  None. FINDINGS: Alignment: Normal Skull base and vertebrae: Previous ACDF C6-7. Soft tissues and spinal canal: No significant soft tissue finding. Disc levels: Foramen magnum is widely patent. C1-2 shows ordinary osteoarthritis without encroachment upon the neural spaces. C2-3:  Normal interspace. C3-4: Mild bulging of the disc and uncovertebral  hypertrophy. No canal stenosis. Mild bony foraminal narrowing bilaterally. C4-5: Endplate osteophytes and bulging of the disc more towards the right. No facet arthropathy. Mild foraminal narrowing on the left and moderate foraminal narrowing on the right. C5-6: Spondylosis with endplate osteophytes and bulging of the disc more prominent towards the right. Mild bony foraminal narrowing on the left and moderate foraminal narrowing on the right. C6-7: Good appearance following ACDF. Wide patency of the canal and foramina. C7-T1: Facet osteoarthritis right worse than left. Small endplate osteophytes. Mild foraminal narrowing on the right that could possibly affect the C8 nerve. Upper thoracic region:  No significant finding. Upper chest: Mild pleural and parenchymal scarring at the apices. Other: None IMPRESSION: Good appearance at the fusion level of C6-7. Right-sided predominant spondylosis at C3-4, C4-5 and C5-6. Right-sided foraminal narrowing at those levels that could possibly affect the right-sided nerve roots. Only mild left-sided foraminal narrowing at those levels. Facet arthropathy on the right at C7-T1. Bony foraminal narrowing on the right that could possibly affect the C8 nerve. Given the right-sided predominant findings, I do not see a distinct cause for left arm symptoms. Electronically Signed   By: Nelson Chimes M.D.   On: 06/01/2017 13:42   US Carotid Bilateral  Result Date: 06/12/2017 CLINICAL DATA:  76 year old female with a history of numbness and tingling. Cardiovascular risk factors include hypertension, prior stroke/ TIA, known coronary artery disease, hyperlipidemia, diabetes EXAM: BILATERAL CAROTID DUPLEX ULTRASOUND TECHNIQUE: Pearline Cables scale imaging, color Doppler and duplex ultrasound were performed of bilateral carotid and vertebral arteries in the neck. COMPARISON:  No prior duplex FINDINGS: Criteria: Quantification of carotid stenosis is based on velocity parameters that correlate the  residual internal carotid diameter with NASCET-based stenosis levels, using the diameter of the distal internal carotid lumen as the denominator for stenosis measurement. The following velocity measurements were obtained: RIGHT ICA:  Systolic 426 cm/sec, Diastolic 22 cm/sec CCA:  98 cm/sec SYSTOLIC ICA/CCA RATIO:  1.1 ECA:  163 cm/sec LEFT ICA:  Systolic 834 cm/sec, Diastolic 18 cm/sec CCA:  196 cm/sec SYSTOLIC ICA/CCA RATIO:  0.8 ECA:  121 cm/sec Right Brachial SBP: Not acquired Left Brachial SBP: Not acquired RIGHT CAROTID ARTERY: No significant calcifications of the right common carotid artery. Intermediate  waveform maintained. Heterogeneous and partially calcified plaque at the right carotid bifurcation. No significant lumen shadowing. Low resistance waveform of the right ICA. No significant tortuosity. RIGHT VERTEBRAL ARTERY: Antegrade flow with low resistance waveform. LEFT CAROTID ARTERY: No significant calcifications of the left common carotid artery. Intermediate waveform maintained. Heterogeneous and partially calcified plaque at the left carotid bifurcation without significant lumen shadowing. Low resistance waveform of the left ICA. No significant tortuosity. LEFT VERTEBRAL ARTERY:  Antegrade flow with low resistance waveform. IMPRESSION: Color duplex indicates minimal heterogeneous and calcified plaque, with no hemodynamically significant stenosis by duplex criteria in the extracranial cerebrovascular circulation. Signed, Dulcy Fanny. Earleen Newport, DO [facility] Electronically Signed   By: Corrie Mckusick D.O.   On: 06/12/2017 10:30   Dg Chest Port 1 View  Result Date: 06/11/2017 CLINICAL DATA:  Chest pain. EXAM: PORTABLE CHEST 1 VIEW COMPARISON:  Radiograph of Jan 26, 2016. FINDINGS: The heart size and mediastinal contours are within normal limits. Atherosclerosis of thoracic aorta is noted. Single lead left-sided pacemaker is unchanged in position.  No pneumothorax or pleural effusion is noted. Both lungs are clear. The visualized skeletal structures are unremarkable. IMPRESSION: No acute cardiopulmonary abnormality seen.  Aortic atherosclerosis. Electronically Signed   By: Marijo Conception, M.D.   On: 06/11/2017 19:40    Micro Results   No results found for this or any previous visit (from the past 240 hour(s)).     Today   Subjective:   Deborah Obrien today has no headache,no chest abdominal pain,no new weakness tingling or numbness, feels much better wants to go home today.  Objective:   Blood pressure 106/65, pulse (!) 56, temperature 98.3 F (36.8 C), temperature source Oral, resp. rate 20, height 5\' 4"  (1.626 m), weight 109.8 kg (242 lb), SpO2 100 %.  No intake or output data in the 24 hours ending 06/16/17 2218  Exam Awake Alert, Oriented x 3, No new F.N deficits, Normal affect Deborah Obrien,PERRAL Supple Neck,No JVD, No cervical lymphadenopathy appriciated.  Symmetrical Chest wall movement, Good air movement bilaterally, CTAB RRR,No Gallops,Rubs or new Murmurs, No Parasternal Heave +ve B.Sounds, Abd Soft, Non tender, No organomegaly appriciated, No rebound -guarding or rigidity. No Cyanosis, Clubbing or edema, No new Rash or bruise  Data Review   CBC w Diff:  Lab Results  Component Value Date   WBC 5.2 06/12/2017   HGB 13.5 06/12/2017   HGB 10.3 (L) 10/16/2014   HCT 39.4 06/12/2017   HCT 33.4 (L) 10/16/2014   PLT 135 (L) 06/12/2017   PLT 252 10/16/2014   LYMPHOPCT 40 06/11/2017   LYMPHOPCT 28.6 10/16/2014   MONOPCT 9 06/11/2017   MONOPCT 9.9 10/16/2014   EOSPCT 2 06/11/2017   EOSPCT 2.9 10/16/2014   BASOPCT 1 06/11/2017   BASOPCT 2.2 10/16/2014    CMP:  Lab Results  Component Value Date   NA 139 06/12/2017   NA 141 10/16/2014   K 3.6 06/12/2017   K 4.6 10/16/2014   CL 103 06/12/2017   CL 108 (H) 10/16/2014   CO2 24 06/12/2017   CO2 27 10/16/2014   BUN 30 (H) 06/12/2017   BUN 25 (H) 10/16/2014    CREATININE 1.72 (H) 06/12/2017   CREATININE 1.35 (H) 10/16/2014   PROT 7.1 06/11/2017   PROT 8.0 10/29/2012   ALBUMIN 3.9 06/11/2017   ALBUMIN 3.9 10/29/2012   BILITOT 1.7 (H) 06/11/2017   BILITOT 1.6 (H) 10/29/2012   ALKPHOS 129 (H) 06/11/2017  ALKPHOS 156 (H) 10/29/2012   AST 49 (H) 06/11/2017   AST 47 (H) 10/29/2012   ALT 28 06/11/2017   ALT 27 10/29/2012  .   Total Time in preparing paper work, data evaluation and todays exam - 72 minutes  Vaani Morren M.D on 06/13/2017 at 10:18 PM    Note: This dictation was prepared with Dragon dictation along with smaller phrase technology. Any transcriptional errors that result from this process are unintentional.

## 2017-06-26 DIAGNOSIS — R0602 Shortness of breath: Secondary | ICD-10-CM | POA: Insufficient documentation

## 2017-06-28 ENCOUNTER — Ambulatory Visit: Payer: Medicare Other | Admitting: Certified Registered"

## 2017-06-28 ENCOUNTER — Ambulatory Visit
Admission: RE | Admit: 2017-06-28 | Discharge: 2017-06-28 | Disposition: A | Discharge status: Home / Self Care | Payer: Medicare Other | Source: Ambulatory Visit | Attending: Internal Medicine | Admitting: Internal Medicine

## 2017-06-28 ENCOUNTER — Encounter: Payer: Self-pay | Admitting: *Deleted

## 2017-06-28 ENCOUNTER — Encounter
Admission: RE | Disposition: A | Discharge status: Home / Self Care | Payer: Self-pay | Source: Ambulatory Visit | Attending: Internal Medicine

## 2017-06-28 DIAGNOSIS — I4891 Unspecified atrial fibrillation: Secondary | ICD-10-CM | POA: Diagnosis not present

## 2017-06-28 DIAGNOSIS — F329 Major depressive disorder, single episode, unspecified: Secondary | ICD-10-CM | POA: Insufficient documentation

## 2017-06-28 DIAGNOSIS — I4892 Unspecified atrial flutter: Secondary | ICD-10-CM | POA: Insufficient documentation

## 2017-06-28 DIAGNOSIS — K219 Gastro-esophageal reflux disease without esophagitis: Secondary | ICD-10-CM | POA: Insufficient documentation

## 2017-06-28 DIAGNOSIS — Z7982 Long term (current) use of aspirin: Secondary | ICD-10-CM | POA: Insufficient documentation

## 2017-06-28 DIAGNOSIS — F419 Anxiety disorder, unspecified: Secondary | ICD-10-CM | POA: Diagnosis not present

## 2017-06-28 DIAGNOSIS — Z8673 Personal history of transient ischemic attack (TIA), and cerebral infarction without residual deficits: Secondary | ICD-10-CM | POA: Diagnosis not present

## 2017-06-28 DIAGNOSIS — M810 Age-related osteoporosis without current pathological fracture: Secondary | ICD-10-CM | POA: Diagnosis not present

## 2017-06-28 DIAGNOSIS — Z885 Allergy status to narcotic agent status: Secondary | ICD-10-CM | POA: Diagnosis not present

## 2017-06-28 DIAGNOSIS — Z79899 Other long term (current) drug therapy: Secondary | ICD-10-CM | POA: Insufficient documentation

## 2017-06-28 DIAGNOSIS — Z6841 Body Mass Index (BMI) 40.0 and over, adult: Secondary | ICD-10-CM | POA: Diagnosis not present

## 2017-06-28 DIAGNOSIS — E7800 Pure hypercholesterolemia, unspecified: Secondary | ICD-10-CM | POA: Insufficient documentation

## 2017-06-28 DIAGNOSIS — Z96653 Presence of artificial knee joint, bilateral: Secondary | ICD-10-CM | POA: Insufficient documentation

## 2017-06-28 DIAGNOSIS — Z886 Allergy status to analgesic agent status: Secondary | ICD-10-CM | POA: Insufficient documentation

## 2017-06-28 DIAGNOSIS — Z91013 Allergy to seafood: Secondary | ICD-10-CM | POA: Insufficient documentation

## 2017-06-28 DIAGNOSIS — Z888 Allergy status to other drugs, medicaments and biological substances status: Secondary | ICD-10-CM | POA: Insufficient documentation

## 2017-06-28 DIAGNOSIS — Z882 Allergy status to sulfonamides status: Secondary | ICD-10-CM | POA: Diagnosis not present

## 2017-06-28 DIAGNOSIS — I110 Hypertensive heart disease with heart failure: Secondary | ICD-10-CM | POA: Diagnosis not present

## 2017-06-28 DIAGNOSIS — I509 Heart failure, unspecified: Secondary | ICD-10-CM | POA: Insufficient documentation

## 2017-06-28 DIAGNOSIS — Z8249 Family history of ischemic heart disease and other diseases of the circulatory system: Secondary | ICD-10-CM | POA: Insufficient documentation

## 2017-06-28 DIAGNOSIS — Z794 Long term (current) use of insulin: Secondary | ICD-10-CM | POA: Diagnosis not present

## 2017-06-28 DIAGNOSIS — I2510 Atherosclerotic heart disease of native coronary artery without angina pectoris: Secondary | ICD-10-CM | POA: Diagnosis not present

## 2017-06-28 DIAGNOSIS — K760 Fatty (change of) liver, not elsewhere classified: Secondary | ICD-10-CM | POA: Insufficient documentation

## 2017-06-28 DIAGNOSIS — E1051 Type 1 diabetes mellitus with diabetic peripheral angiopathy without gangrene: Secondary | ICD-10-CM | POA: Diagnosis not present

## 2017-06-28 DIAGNOSIS — Z881 Allergy status to other antibiotic agents status: Secondary | ICD-10-CM | POA: Diagnosis not present

## 2017-06-28 HISTORY — PX: CARDIOVERSION: EP1203

## 2017-06-28 HISTORY — PX: TEE WITHOUT CARDIOVERSION: SHX5443

## 2017-06-28 LAB — GLUCOSE, CAPILLARY: GLUCOSE-CAPILLARY: 208 mg/dL — AB (ref 65–99)

## 2017-06-28 SURGERY — ECHOCARDIOGRAM, TRANSESOPHAGEAL
Anesthesia: General

## 2017-06-28 MED ORDER — SODIUM CHLORIDE 0.9 % IV SOLN
INTRAVENOUS | Status: DC
Start: 2017-06-28 — End: 2017-06-28

## 2017-06-28 MED ORDER — PROPOFOL 10 MG/ML IV BOLUS
INTRAVENOUS | Status: DC | PRN
  Administered 2017-06-28 (×6): 20 mg via INTRAVENOUS

## 2017-06-28 MED ORDER — MIDAZOLAM HCL 2 MG/2ML IJ SOLN
INTRAMUSCULAR | Status: AC
  Filled 2017-06-28: qty 2

## 2017-06-28 MED ORDER — ONDANSETRON HCL 4 MG/2ML IJ SOLN: 4.0000 mg | Freq: Once | INTRAMUSCULAR | Status: DC | PRN

## 2017-06-28 MED ORDER — FENTANYL CITRATE (PF) 100 MCG/2ML IJ SOLN: 25.0000 ug | INTRAMUSCULAR | Status: DC | PRN

## 2017-06-28 MED ORDER — MIDAZOLAM HCL 2 MG/2ML IJ SOLN
INTRAMUSCULAR | Status: DC | PRN
  Administered 2017-06-28 (×2): 1 mg via INTRAVENOUS

## 2017-06-28 MED ORDER — APIXABAN 5 MG PO TABS: 5.0000 mg | ORAL_TABLET | Freq: Two times a day (BID) | ORAL | 2 refills | Status: DC

## 2017-06-28 MED ORDER — LIDOCAINE VISCOUS 2 % MT SOLN
OROMUCOSAL | Status: AC
  Filled 2017-06-28: qty 15

## 2017-06-28 MED ORDER — PROPOFOL 10 MG/ML IV BOLUS
INTRAVENOUS | Status: AC
  Filled 2017-06-28: qty 20

## 2017-06-28 MED ORDER — BUTAMBEN-TETRACAINE-BENZOCAINE 2-2-14 % EX AERO
INHALATION_SPRAY | CUTANEOUS | Status: AC
  Filled 2017-06-28: qty 20

## 2017-06-28 MED ORDER — SODIUM CHLORIDE 0.9 % IV SOLN: INTRAVENOUS | Status: DC

## 2017-06-28 MED ORDER — SODIUM CHLORIDE 0.9 % IV SOLN
INTRAVENOUS | Status: DC | PRN
  Administered 2017-06-28: 08:00:00 via INTRAVENOUS

## 2017-06-28 NOTE — Progress Notes (Signed)
Patient clinically stable post TEE/Cardioversion with Dr Nehemiah Massed, noting sinus rhythm with intermittent paced rhythm as well. Son at bediside. Vitals stable. Dr Nehemiah Massed speaking with son/patient with questions answered. Denies complaints except gout pain in right foot that she presented with from home, ice pack to foot.

## 2017-06-28 NOTE — CV Procedure (Signed)
Electrical Cardioversion Procedure Note NATAVIA SUBLETTE [id number] 07-23-1941  Procedure: Electrical Cardioversion Indications:  Atrial Flutter  Procedure Details Consent: Risks of procedure as well as the alternatives and risks of each were explained to the (patient/caregiver).  Consent for procedure obtained. Time Out: Verified patient identification, verified procedure, site/side was marked, verified correct patient position, special equipment/implants available, medications/allergies/relevent history reviewed, required imaging and test results available.  Performed  Patient placed on cardiac monitor, pulse oximetry, supplemental oxygen as necessary.  Sedation given: Short-acting barbiturates Pacer pads placed anterior and posterior chest.  Cardioverted 1 time(s).  Cardioverted at 120J.  Evaluation Findings: Post procedure EKG shows: NSR with ventricular pacing Complications: None Patient did tolerate procedure well.   Corey Skains 06/28/2017, 8:11 AM

## 2017-06-28 NOTE — Anesthesia Preprocedure Evaluation (Signed)
Anesthesia Evaluation  Patient identified by MRN, date of birth, ID band Patient awake    Reviewed: Allergy & Precautions, NPO status , Patient's Chart, lab work & pertinent test results  Airway Mallampati: II       Dental  (+) Upper Dentures   Pulmonary shortness of breath and with exertion,     + decreased breath sounds      Cardiovascular Exercise Tolerance: Poor hypertension, Pt. on medications + angina + CAD, + Peripheral Vascular Disease and +CHF  (-) pacemaker Rhythm:Irregular Rate:Tachycardia     Neuro/Psych Anxiety Depression TIA   GI/Hepatic GERD  Medicated,  Endo/Other  diabetes, Type 1, Insulin DependentMorbid obesity  Renal/GU CRFRenal disease  negative genitourinary   Musculoskeletal   Abdominal (+) + obese,   Peds  Hematology   Anesthesia Other Findings   Reproductive/Obstetrics                             Anesthesia Physical Anesthesia Plan  ASA: III  Anesthesia Plan: General   Post-op Pain Management:    Induction: Intravenous  PONV Risk Score and Plan:   Airway Management Planned: Natural Airway and Nasal Cannula  Additional Equipment:   Intra-op Plan:   Post-operative Plan:   Informed Consent: I have reviewed the patients History and Physical, chart, labs and discussed the procedure including the risks, benefits and alternatives for the proposed anesthesia with the patient or authorized representative who has indicated his/her understanding and acceptance.     Plan Discussed with: CRNA  Anesthesia Plan Comments:         Anesthesia Quick Evaluation

## 2017-06-28 NOTE — Progress Notes (Signed)
Patient remains clinically stable post procedure, taking po's without difficulty, denies complaints at this time. Sinus rhythm per monitor. Discharge instructions given with questions answered.

## 2017-06-28 NOTE — Discharge Instructions (Signed)
Electrical Cardioversion, Care After °This sheet gives you information about how to care for yourself after your procedure. Your health care provider may also give you more specific instructions. If you have problems or questions, contact your health care provider. °What can I expect after the procedure? °After the procedure, it is common to have: °· Some redness on the skin where the shocks were given. ° °Follow these instructions at home: °· Do not drive for 24 hours if you were given a medicine to help you relax (sedative). °· Take over-the-counter and prescription medicines only as told by your health care provider. °· Ask your health care provider how to check your pulse. Check it often. °· Rest for 48 hours after the procedure or as told by your health care provider. °· Avoid or limit your caffeine use as told by your health care provider. °Contact a health care provider if: °· You feel like your heart is beating too quickly or your pulse is not regular. °· You have a serious muscle cramp that does not go away. °Get help right away if: °· You have discomfort in your chest. °· You are dizzy or you feel faint. °· You have trouble breathing or you are short of breath. °· Your speech is slurred. °· You have trouble moving an arm or leg on one side of your body. °· Your fingers or toes turn cold or blue. °This information is not intended to replace advice given to you by your health care provider. Make sure you discuss any questions you have with your health care provider. °Document Released: 06/19/2013 Document Revised: 04/01/2016 Document Reviewed: 03/04/2016 °Elsevier Interactive Patient Education © 2018 [employer] ° °

## 2017-06-28 NOTE — Transfer of Care (Signed)
Immediate Anesthesia Transfer of Care Note  Patient: Deborah Obrien  Procedure(s) Performed: TRANSESOPHAGEAL ECHOCARDIOGRAM (TEE) (N/A ) CARDIOVERSION (N/A )  Patient Location: spu  Anesthesia Type:General  Level of Consciousness: awake  Airway & Oxygen Therapy: Patient Spontanous Breathing and Patient connected to nasal cannula oxygen  Post-op Assessment: Report given to RN and Post -op Vital signs reviewed and stable  Post vital signs: Reviewed  Last Vitals:  Vitals:   06/28/17 0802 06/28/17 0804  BP:  128/73  Pulse: 89 94  Resp: (!) 21 (!) 21  Temp:  (!) 36.1 C  SpO2: 95% 97%    Last Pain:  Vitals:   06/28/17 0643  PainSc: 6          Complications: No apparent anesthesia complications

## 2017-06-28 NOTE — Anesthesia Post-op Follow-up Note (Signed)
Anesthesia QCDR form completed.        

## 2017-06-28 NOTE — Anesthesia Postprocedure Evaluation (Signed)
Anesthesia Post Note  Patient: Deborah Obrien  Procedure(s) Performed: TRANSESOPHAGEAL ECHOCARDIOGRAM (TEE) (N/A ) CARDIOVERSION (N/A )  Patient location during evaluation: PACU Anesthesia Type: General Level of consciousness: awake Pain management: pain level controlled Vital Signs Assessment: post-procedure vital signs reviewed and stable Respiratory status: nonlabored ventilation Cardiovascular status: stable Anesthetic complications: no     Last Vitals:  Vitals:   06/28/17 0845 06/28/17 0900  BP: 113/65 110/65  Pulse: 67 67  Resp: 15 17  Temp:    SpO2: 93% 96%    Last Pain:  Vitals:   06/28/17 0845  PainSc: 3                  VAN STAVEREN,Dura Mccormack

## 2017-08-24 ENCOUNTER — Encounter: Payer: Self-pay | Admitting: *Deleted

## 2017-08-24 ENCOUNTER — Telehealth: Payer: Self-pay | Admitting: Internal Medicine

## 2017-08-24 ENCOUNTER — Ambulatory Visit (INDEPENDENT_AMBULATORY_CARE_PROVIDER_SITE_OTHER): Payer: Medicare Other | Admitting: Internal Medicine

## 2017-08-24 ENCOUNTER — Encounter: Payer: Self-pay | Admitting: Internal Medicine

## 2017-08-24 ENCOUNTER — Other Ambulatory Visit
Admission: RE | Admit: 2017-08-24 | Discharge: 2017-08-24 | Disposition: A | Discharge status: Home / Self Care | Payer: Medicare Other | Source: Ambulatory Visit | Attending: Internal Medicine | Admitting: Internal Medicine

## 2017-08-24 VITALS — BP 112/67 | HR 75 | Ht 64.0 in | Wt 250.5 lb

## 2017-08-24 DIAGNOSIS — I495 Sick sinus syndrome: Secondary | ICD-10-CM | POA: Diagnosis not present

## 2017-08-24 DIAGNOSIS — I483 Typical atrial flutter: Secondary | ICD-10-CM | POA: Diagnosis not present

## 2017-08-24 DIAGNOSIS — Z01812 Encounter for preprocedural laboratory examination: Secondary | ICD-10-CM | POA: Insufficient documentation

## 2017-08-24 DIAGNOSIS — I97190 Other postprocedural cardiac functional disturbances following cardiac surgery: Secondary | ICD-10-CM | POA: Diagnosis present

## 2017-08-24 DIAGNOSIS — I442 Atrioventricular block, complete: Secondary | ICD-10-CM | POA: Diagnosis not present

## 2017-08-24 DIAGNOSIS — I4892 Unspecified atrial flutter: Secondary | ICD-10-CM

## 2017-08-24 HISTORY — DX: Unspecified atrial flutter: I48.92

## 2017-08-24 HISTORY — DX: Sick sinus syndrome: I49.5

## 2017-08-24 LAB — BASIC METABOLIC PANEL
ANION GAP: 8 (ref 5–15)
BUN: 27 mg/dL — ABNORMAL HIGH (ref 6–20)
CHLORIDE: 105 mmol/L (ref 101–111)
CO2: 25 mmol/L (ref 22–32)
CREATININE: 1.32 mg/dL — AB (ref 0.44–1.00)
Calcium: 9 mg/dL (ref 8.9–10.3)
GFR calc non Af Amer: 38 mL/min — ABNORMAL LOW (ref >60)
GFR, EST AFRICAN AMERICAN: 44 mL/min — AB (ref >60)
Glucose, Bld: 151 mg/dL — ABNORMAL HIGH (ref 65–99)
POTASSIUM: 4.5 mmol/L (ref 3.5–5.1)
SODIUM: 138 mmol/L (ref 135–145)

## 2017-08-24 LAB — CBC WITH DIFFERENTIAL/PLATELET
BASOS ABS: 0 10*3/uL (ref 0–0.1)
BASOS PCT: 1 %
EOS ABS: 0.1 10*3/uL (ref 0–0.7)
Eosinophils Relative: 3 %
HCT: 41.4 % (ref 35.0–47.0)
HEMOGLOBIN: 13.5 g/dL (ref 12.0–16.0)
Lymphocytes Relative: 34 %
Lymphs Abs: 1.5 10*3/uL (ref 1.0–3.6)
MCH: 32.6 pg (ref 26.0–34.0)
MCHC: 32.6 g/dL (ref 32.0–36.0)
MCV: 100.1 fL — ABNORMAL HIGH (ref 80.0–100.0)
Monocytes Absolute: 0.4 10*3/uL (ref 0.2–0.9)
Monocytes Relative: 9 %
NEUTROS PCT: 53 %
Neutro Abs: 2.4 10*3/uL (ref 1.4–6.5)
Platelets: 162 10*3/uL (ref 150–440)
RBC: 4.14 MIL/uL (ref 3.80–5.20)
RDW: 13.7 % (ref 11.5–14.5)
WBC: 4.5 10*3/uL (ref 3.6–11.0)

## 2017-08-24 NOTE — Telephone Encounter (Signed)
Patient taking Macrobid for a UTI- reviewed with [name]. The patient is ok to proceed with her PPM upgrade on Monday 08/28/17.  The patient is aware.

## 2017-08-24 NOTE — Progress Notes (Signed)
ELECTROPHYSIOLOGY CONSULT NOTE  Patient ID: Deborah Obrien, MRN: [id number], DOB/AGE: 77/19/1942 77 y.o. Admit date: (Not on file) Date of Consult: 08/24/2017  Primary Physician: [name], MD Primary Cardiologist: B Nicolet Griffy is a 76 y.o. female who is being seen today for the evaluation of atrial arrhythmias at the request of [name]   HPI [name] is a 76 y.o. female  Referred for consideration of therapy for atrial arrhythmias in the context of worsening exercise intolerance.  She had a long-standing history of paroxysmal atrial arrhythmias as noted below.  She has sinus node dysfunction with bradycardia precluding up titration of her rate controlling medications  She underwent single-chamber VVI pacemaker implantation 10/16 with antecedent sinus bradycardia  She has continued to have progressive exercise intolerance.  She is short of breath at less than 100 feet.  Not withstanding her multiple musculoskeletal issues i.e. hips back neck etc., she is more limited by her breathing than anything.  She has had some problems with peripheral edema.  Prior use of diuretics were discontinued because of worsening renal insufficiency.  She is addressed her edema of late by resting her legs up  11/16 echocardiogram 55-60% 12/17 echocardiogram-stress EF 45% without changes  12/17 catheterization nonobstructive coronary disease 2+ MR  5/17 ECG personally reviewed ventricular pacing with retrograde conduction 10/15 atrial fibrillation with rapid ventricular response 12/15 atrial flutter-atypical with 2: 1 conduction 2016 atrial flutter-typical with 2: 1 conduction  Thromboembolic risk factors ( age  -2, HTN-1, TIA/CVA-2, DM-1, Gender-1) for a CHADSVASc Score of 7  She was started on anticoagulation with Eliquis about a month ago.  Prior to that she had been on aspirin because of a history of recurrent GI bleeding related to polyps  She  also has had a history of recurrent falls.  These occur following standing.  But.  She notes that she has to stand for a few moments prior to walking.  There has had physical therapy numerous times following back and knee surgeries.  Past Medical History:  Diagnosis Date  . A-fib ([place]) 2016  . Acid reflux   . Anxiety   . Bowel obstruction ([place])   . CHF (congestive heart failure) ([place]) 2017  . Chronic kidney disease (CKD) stage G3a/A1, moderately decreased glomerular filtration rate (GFR) between 45-59 mL/min/1.73 square meter and albuminuria creatinine ratio less than 30 mg/g ([facility])   . Diabetes mellitus without complication ([place])   . Dyspnea   . Fatty liver   . GI bleed   . Gout   . Hypertension   . Morbid obesity ([place])   . Osteoarthritis   . Osteoporosis   . Paroxysmal atrial fibrillation ([facility])   . Presence of permanent cardiac pacemaker   . TIA (transient ischemic attack)       Surgical History:  Past Surgical History:  Procedure Laterality Date  . ABDOMINAL HYSTERECTOMY  1980  . APPENDECTOMY    . BACK SURGERY     2008  . BREAST BIOPSY Right 08/16   stereo fibroadenomatous change, neg for atypia  . BREAST EXCISIONAL BIOPSY Left    neg  . CARDIAC CATHETERIZATION Left 08/25/2016   Procedure: Left Heart Cath and Coronary Angiography;  Surgeon: Corey Skains, MD;  Location: [facility];  Service: Cardiovascular;  Laterality: Left;  . CARDIOVERSION N/A 06/28/2017   Procedure: CARDIOVERSION;  Surgeon: Corey Skains, MD;  Location: [facility];  Service: Cardiovascular;  Laterality: N/A;  . CATARACT EXTRACTION, BILATERAL  2012  . CHOLECYSTECTOMY    . colon blockage  1999  . COLON SURGERY  1999  . COLONOSCOPY  2016   polyps removed 2016  . DORSAL COMPARTMENT RELEASE Left 09/14/2016   Procedure: RELEASE DORSAL COMPARTMENT (DEQUERVAIN);  Surgeon: Dereck Leep, MD;  Location: [facility];  Service: Orthopedics;  Laterality: Left;  . ESOPHAGOGASTRODUODENOSCOPY N/A  01/27/2015   Procedure: ESOPHAGOGASTRODUODENOSCOPY (EGD);  Surgeon: Lollie Sails, MD;  Location[facility];  Service: Endoscopy;  Laterality: N/A;  . ESOPHAGOGASTRODUODENOSCOPY (EGD) WITH PROPOFOL N/A 07/24/2015   Procedure: ESOPHAGOGASTRODUODENOSCOPY (EGD) WITH PROPOFOL;  Surgeon: Lucilla Lame, MD;  Location: [facility];  Service: Endoscopy;  Laterality: N/A;  . JOINT REPLACEMENT  2014   Bilateral Knee replacement  . PACEMAKER INSERTION Left 07/01/2015   Procedure: INSERTION PACEMAKER;  Surgeon: Isaias Cowman, MD;  Location: [facility];  Service: Cardiovascular;  Laterality: Left;  . REPLACEMENT TOTAL KNEE BILATERAL    . SHOULDER ARTHROSCOPY WITH SUBACROMIAL DECOMPRESSION Left 2013  . TEE WITHOUT CARDIOVERSION N/A 06/28/2017   Procedure: TRANSESOPHAGEAL ECHOCARDIOGRAM (TEE);  Surgeon: Corey Skains, MD;  Location: [facility];  Service: Cardiovascular;  Laterality: N/A;     Home Meds: Prior to Admission medications   Medication Sig Start Date End Date Taking? Authorizing Provider  acetaminophen (TYLENOL) 500 MG tablet Take 500-1,000 mg by mouth every 6 (six) hours as needed for mild pain or moderate pain.   Yes [provider]  allopurinol (ZYLOPRIM) 100 MG tablet Take 150 mg by mouth daily.   Yes [provider]  amiodarone (PACERONE) 100 MG tablet Take 200 mg by mouth daily.    Yes [provider]  apixaban (ELIQUIS) 5 MG TABS tablet Take 1 tablet (5 mg total) by mouth 2 (two) times daily. 06/28/17  Yes Corey Skains, MD  busPIRone (BUSPAR) 10 MG tablet Take 10 mg by mouth 2 (two) times daily.   Yes [provider]  calcitRIOL (ROCALTROL) 0.25 MCG capsule Take 0.25 mcg by mouth daily.  11/07/16  Yes [provider]  colchicine 0.6 MG tablet Take 0.6 mg by mouth daily as needed (gout).    Yes [provider]  docusate sodium (COLACE) 100 MG capsule Take 100 mg by mouth at bedtime.    Yes [provider]    Hypromellose (ARTIFICIAL TEARS OP) Apply 1 drop to eye daily as needed (dry eyes).   Yes [provider]  insulin NPH-regular Human (NOVOLIN 70/30) (70-30) 100 UNIT/ML injection Inject 22 Units into the skin 2 (two) times daily before a meal.    Yes [provider]  ketoconazole (NIZORAL) 2 % cream Apply 1 application topically every 2 (two) hours as needed (for rash under breasts).    Yes [provider]  mupirocin cream (BACTROBAN) 2 % Apply 1 application topically daily as needed (rash).  01/15/16  Yes [provider]  oxybutynin (DITROPAN) 5 MG tablet Take 0.5 tablets (2.5 mg total) by mouth at bedtime. 06/13/17  Yes Epifanio Lesches, MD  pantoprazole (PROTONIX) 40 MG tablet Take 40 mg by mouth 2 (two) times daily.   Yes [provider]  PARoxetine (PAXIL) 20 MG tablet Take 20 mg by mouth daily.   Yes [provider]  pravastatin (PRAVACHOL) 20 MG tablet Take 20 mg by mouth at bedtime.    Yes [provider]  traZODone (DESYREL) 50 MG tablet Take 1 tablet (50 mg total) by mouth at bedtime. 06/13/17  Yes Epifanio Lesches, MD    Allergies:  Allergies  Allergen Reactions  . Cephalosporins Other (See Comments)    Reaction:  Unknown   . Hydrocodone-Acetaminophen Hives  . Macrolides And Ketolides Other (See Comments)    Reaction:  Unknown   . Meperidine Shortness Of Breath, Nausea Only and Other (See Comments)    Reaction:  Stomach pain   . Prednisone Anaphylaxis and Other (See Comments)    Reaction:  Increases pts blood sugar  Pt states that she is allergic to all steroids.    . Shellfish Allergy Shortness Of Breath, Diarrhea, Nausea Only, Rash and Other (See Comments)    Reaction:  Stomach pain   . Sulfa Antibiotics Shortness Of Breath, Diarrhea, Nausea Only and Other (See Comments)    Reaction:  Stomach pain   . Uloric [Febuxostat] Anaphylaxis    Locks pt's body up Locks pt's body up  . Aspirin Other (See Comments)     Reaction:  Stomach pain   . Celecoxib Other (See Comments)    Reaction:  GI bleed, weakness, and stomach pain.    . Cephalexin Diarrhea, Nausea Only and Other (See Comments)    Reaction:  Stomach pain   . Erythromycin Diarrhea, Nausea Only and Other (See Comments)    Reaction:  Stomach pain   . Hydromorphone Other (See Comments)    Reaction:  Hypotension   . Iodinated Diagnostic Agents Rash and Other (See Comments)    Pt states that she is unable to have because she has chronic kidney disease.    Marland Kitchen Oxycodone Other (See Comments)    Reaction:  Stomach pain   . Atorvastatin Nausea Only and Other (See Comments)    Reaction:  Weakness   . Codeine Diarrhea, Nausea Only and Other (See Comments)    Reaction:  Stomach pain   . Tape Other (See Comments)    Reaction:  Causes pts skin to tear  Pt states that she is able to use paper tape.    . Valdecoxib Swelling and Other (See Comments)    Pt states that her hands and feet swell.    . Iodine Rash    Social History   Socioeconomic History  . Marital status: Married    Spouse name: Not on file  . Number of children: Not on file  . Years of education: Not on file  . Highest education level: Not on file  Social Needs  . Financial resource strain: Not on file  . Food insecurity - worry: Not on file  . Food insecurity - inability: Not on file  . Transportation needs - medical: Not on file  . Transportation needs - non-medical: Not on file  Occupational History  . Occupation: retired  Tobacco Use  . Smoking status: Never Smoker  . Smokeless tobacco: Never Used  Substance and Sexual Activity  . Alcohol use: No  . Drug use: No  . Sexual activity: Yes  Other Topics Concern  . Not on file  Social History Narrative  . Not on file     Family History  Problem Relation Age of Onset  . Diabetes Mellitus II Mother   . CAD Mother   . Heart attack Mother   . Cancer Father        skin  . Breast cancer Neg Hx      ROS:  Please  see the history of present illness.     All other systems reviewed and negative.    Physical Exam:  Blood pressure 112/67, pulse 75, height 5\' 4"  (1.626 m), weight 250 lb 8 oz (113.6 kg). General: Well developed,Morbidly obese  female in no acute distress but with moderate discomfort trying to walk. Head: Normocephalic, atraumatic, sclera non-icteric, no xanthomas, nares are without discharge. EENT: normal  Lymph Nodes:  none Neck: Negative for carotid bruits. JVD 9-10 cm Back:without scoliosis kyphosis Lungs: Clear bilaterally to auscultation without wheezes, rales, or rhonchi. Breathing is unlabored. Heart: RRR with S1 S2. No  murmur . No rubs, or gallops appreciated. Abdomen: Soft, non-tender, non-distended with normoactive bowel sounds. No hepatomegaly. No rebound/guarding. No obvious abdominal masses. Msk:  Strength and tone appear normal for age. Extremities: No clubbing or cyanosis.  Tr edema.  Distal pedal pulses are 2+ and equal bilaterally. Skin: Warm and Dry Neuro: Alert and oriented X 3. CN III-XII intact Grossly normal sensory and motor function . Psych:  Responds to questions appropriately with a normal affect.      Labs: Cardiac Enzymes No results for input(s): CKTOTAL, CKMB, TROPONINI in the last 72 hours. CBC Lab Results  Component Value Date   WBC 5.2 06/12/2017   HGB 13.5 06/12/2017   HCT 39.4 06/12/2017   MCV 98.4 06/12/2017   PLT 135 (L) 06/12/2017   PROTIME: No results for input(s): LABPROT, INR in the last 72 hours. Chemistry No results for input(s): NA, K, CL, CO2, BUN, CREATININE, CALCIUM, PROT, BILITOT, ALKPHOS, ALT, AST, GLUCOSE in the last 168 hours.  Invalid input(s): LABALBU Lipids Lab Results  Component Value Date   CHOL 129 06/12/2017   HDL 46 06/12/2017   LDLCALC 61 06/12/2017   TRIG 110 06/12/2017   BNP No results found for: PROBNP Thyroid Function Tests: No results for input(s): TSH, T4TOTAL, T3FREE, THYROIDAB in the last 72  hours.  Invalid input(s): FREET3 Miscellaneous No results found for: DDIMER  Radiology/Studies:  No results found.  EKG: Sinus rhythm with a   Chest Xray personally reviewed single-chamber Medtronic pacemaker without evidence of pulmonary edema 9/18  Assessment and Plan:   Sinus node dysfunction  Paroxysmal atrial fibrillation/flutter with a rapid ventricular response  Cardiomyopathy-modest  Thromboembolic risk profile as noted above  GI bleeding related to polyps  Obstructive sleep apnea with sleep disordered breathing and daytime somnolence  Single-chamber pacemaker-Medtronic  Amiodarone   The patient has exercise intolerance with a number of different potential contributors.  First is ventricular pacing in the setting of sinus rhythm.  The re-engagement of atrial contractility I think offers [place] our best to single therapeutic option by upgrade of her device to a dual-chamber pacemaker as it 1) would restore AV synchrony, to potentially allow for intrinsic conduction 2)  decreased ventricular pacing which may be aggravating her cardiomyopathy, and 3) there are data that it would further decrease the atrial arrhythmia burden.  We have discussed risks and benefits. The benefits and risks were reviewed including but not limited to death,  perforation, infection, lead dislodgement and device malfunction.  The patient understands agrees and is willing to proceed.  At this juncture I would continue her on her amiodarone.  Trying to reduce the dose ongoing might be possible particularly if there is a significant trigger for atrial arrhythmias related to [place] conduction  I agree with the use of anticoagulation and we have discussed the relative consequences of thromboembolism versus recurrent GI bleeding.  She will continue on her Eliquis.  We will hold it for 4 days around the time of her procedure because of its association  with increased bleeding.     [name]

## 2017-08-24 NOTE — Patient Instructions (Signed)
Medication Instructions: - Your physician recommends that you continue on your current medications as directed. Please refer to the Current Medication list given to you today.  Labwork: - Your physician recommends that you have lab work today: BMP/ CBC  Procedures/Testing: - Your physician has recommended that you have a pacemaker upgrade. Please see the instruction sheet given to you today for more information.  Follow-Up: - Your physician recommends that you schedule a follow-up appointment in: about 10-14 days (from 08/28/17) with the device clinic nurse for a wound check  - Your physician recommends that you schedule a follow-up appointment in: 91 days (from 12/17) with [name]     Any Additional Special Instructions Will Be Listed Below (If Applicable).     If you need a refill on your cardiac medications before your next appointment, please call your pharmacy.

## 2017-08-24 NOTE — H&P (View-Only) (Signed)
ELECTROPHYSIOLOGY CONSULT NOTE  Patient ID: Deborah Obrien, MRN: [id number], DOB/AGE: [birth year] 76 y.o. Admit date: (Not on file) Date of Consult: 08/24/2017  Primary Physician: [name], MD Primary Cardiologist: B Alya Smaltz is a 76 y.o. female who is being seen today for the evaluation of atrial arrhythmias at the request of [name]   HPI Deborah Obrien is a 76 y.o. female  Referred for consideration of therapy for atrial arrhythmias in the context of worsening exercise intolerance.  She had a long-standing history of paroxysmal atrial arrhythmias as noted below.  She has sinus node dysfunction with bradycardia precluding up titration of her rate controlling medications  She underwent single-chamber VVI pacemaker implantation 10/16 with antecedent sinus bradycardia  She has continued to have progressive exercise intolerance.  She is short of breath at less than 100 feet.  Not withstanding her multiple musculoskeletal issues i.e. hips back neck etc., she is more limited by her breathing than anything.  She has had some problems with peripheral edema.  Prior use of diuretics were discontinued because of worsening renal insufficiency.  She is addressed her edema of late by resting her legs up  11/16 echocardiogram 55-60% 12/17 echocardiogram-stress EF 45% without changes  12/17 catheterization nonobstructive coronary disease 2+ MR  5/17 ECG personally reviewed ventricular pacing with retrograde conduction 10/15 atrial fibrillation with rapid ventricular response 12/15 atrial flutter-atypical with 2: 1 conduction 2016 atrial flutter-typical with 2: 1 conduction  Thromboembolic risk factors ( age  -2, HTN-1, TIA/CVA-2, DM-1, Gender-1) for a CHADSVASc Score of 7  She was started on anticoagulation with Eliquis about a month ago.  Prior to that she had been on aspirin because of a history of recurrent GI bleeding related to polyps  She  also has had a history of recurrent falls.  These occur following standing.  But.  She notes that she has to stand for a few moments prior to walking.  There has had physical therapy numerous times following back and knee surgeries.  Past Medical History:  Diagnosis Date  . A-fib ([place]) 2016  . Acid reflux   . Anxiety   . Bowel obstruction ([place])   . CHF (congestive heart failure) ([place]) 2017  . Chronic kidney disease (CKD) stage G3a/A1, moderately decreased glomerular filtration rate (GFR) between 45-59 mL/min/1.73 square meter and albuminuria creatinine ratio less than 30 mg/g (H[facility])   . Diabetes mellitus without complication ([place])   . Dyspnea   . Fatty liver   . GI bleed   . Gout   . Hypertension   . Morbid obesity ([place])   . Osteoarthritis   . Osteoporosis   . Paroxysmal atrial fibrillation (H[facility])   . Presence of permanent cardiac pacemaker   . TIA (transient ischemic attack)       Surgical History:  Past Surgical History:  Procedure Laterality Date  . ABDOMINAL HYSTERECTOMY  1980  . APPENDECTOMY    . BACK SURGERY     2008  . BREAST BIOPSY Right 08/16   stereo fibroadenomatous change, neg for atypia  . BREAST EXCISIONAL BIOPSY Left    neg  . CARDIAC CATHETERIZATION Left 08/25/2016   Procedure: Left Heart Cath and Coronary Angiography;  Surgeon: Corey Skains, MD;  Location: [place];  Service: Cardiovascular;  Laterality: Left;  . CARDIOVERSION N/A 06/28/2017   Procedure: CARDIOVERSION;  Surgeon: Corey Skains, MD;  Location: [facility];  Service: Cardiovascular;  Laterality: N/A;  . CATARACT EXTRACTION, BILATERAL  2012  . CHOLECYSTECTOMY    . colon blockage  1999  . COLON SURGERY  1999  . COLONOSCOPY  2016   polyps removed 2016  . DORSAL COMPARTMENT RELEASE Left 09/14/2016   Procedure: RELEASE DORSAL COMPARTMENT (DEQUERVAIN);  Surgeon: Dereck Leep, MD;  Location: [facility];  Service: Orthopedics;  Laterality: Left;  . ESOPHAGOGASTRODUODENOSCOPY N/A  01/27/2015   Procedure: ESOPHAGOGASTRODUODENOSCOPY (EGD);  Surgeon: Lollie Sails, MD;  Location: [facility];  Service: Endoscopy;  Laterality: N/A;  . ESOPHAGOGASTRODUODENOSCOPY (EGD) WITH PROPOFOL N/A 07/24/2015   Procedure: ESOPHAGOGASTRODUODENOSCOPY (EGD) WITH PROPOFOL;  Surgeon: Lucilla Lame, MD;  Location: [facility] END[facility];  Service: Endoscopy;  Laterality: N/A;  . JOINT REPLACEMENT  2014   Bilateral Knee replacement  . PACEMAKER INSERTION Left 07/01/2015   Procedure: INSERTION PACEMAKER;  Surgeon: Isaias Cowman, MD;  Location: [facility];  Service: Cardiovascular;  Laterality: Left;  . REPLACEMENT TOTAL KNEE BILATERAL    . SHOULDER ARTHROSCOPY WITH SUBACROMIAL DECOMPRESSION Left 2013  . TEE WITHOUT CARDIOVERSION N/A 06/28/2017   Procedure: TRANSESOPHAGEAL ECHOCARDIOGRAM (TEE);  Surgeon: Corey Skains, MD;  Location: [facility];  Service: Cardiovascular;  Laterality: N/A;     Home Meds: Prior to Admission medications   Medication Sig Start Date End Date Taking? Authorizing Provider  acetaminophen (TYLENOL) 500 MG tablet Take 500-1,000 mg by mouth every 6 (six) hours as needed for mild pain or moderate pain.   Yes [provider]  allopurinol (ZYLOPRIM) 100 MG tablet Take 150 mg by mouth daily.   Yes [provider]  amiodarone (PACERONE) 100 MG tablet Take 200 mg by mouth daily.    Yes [provider]  apixaban (ELIQUIS) 5 MG TABS tablet Take 1 tablet (5 mg total) by mouth 2 (two) times daily. 06/28/17  Yes Corey Skains, MD  busPIRone (BUSPAR) 10 MG tablet Take 10 mg by mouth 2 (two) times daily.   Yes [provider]  calcitRIOL (ROCALTROL) 0.25 MCG capsule Take 0.25 mcg by mouth daily.  11/07/16  Yes [provider]  colchicine 0.6 MG tablet Take 0.6 mg by mouth daily as needed (gout).    Yes [provider]  docusate sodium (COLACE) 100 MG capsule Take 100 mg by mouth at bedtime.    Yes [provider]    Hypromellose (ARTIFICIAL TEARS OP) Apply 1 drop to eye daily as needed (dry eyes).   Yes [provider]  insulin NPH-regular Human (NOVOLIN 70/30) (70-30) 100 UNIT/ML injection Inject 22 Units into the skin 2 (two) times daily before a meal.    Yes [provider]  ketoconazole (NIZORAL) 2 % cream Apply 1 application topically every 2 (two) hours as needed (for rash under breasts).    Yes [provider]  mupirocin cream (BACTROBAN) 2 % Apply 1 application topically daily as needed (rash).  01/15/16  Yes [provider]  oxybutynin (DITROPAN) 5 MG tablet Take 0.5 tablets (2.5 mg total) by mouth at bedtime. 06/13/17  Yes Epifanio Lesches, MD  pantoprazole (PROTONIX) 40 MG tablet Take 40 mg by mouth 2 (two) times daily.   Yes [provider]  PARoxetine (PAXIL) 20 MG tablet Take 20 mg by mouth daily.   Yes [provider]  pravastatin (PRAVACHOL) 20 MG tablet Take 20 mg by mouth at bedtime.    Yes [provider]  traZODone (DESYREL) 50 MG tablet Take 1 tablet (50 mg total) by mouth at bedtime. 06/13/17  Yes Epifanio Lesches, MD    Allergies:  Allergies  Allergen Reactions  . Cephalosporins Other (See Comments)    Reaction:  Unknown   . Hydrocodone-Acetaminophen Hives  . Macrolides And Ketolides Other (See Comments)    Reaction:  Unknown   . Meperidine Shortness Of Breath, Nausea Only and Other (See Comments)    Reaction:  Stomach pain   . Prednisone Anaphylaxis and Other (See Comments)    Reaction:  Increases pts blood sugar  Pt states that she is allergic to all steroids.    . Shellfish Allergy Shortness Of Breath, Diarrhea, Nausea Only, Rash and Other (See Comments)    Reaction:  Stomach pain   . Sulfa Antibiotics Shortness Of Breath, Diarrhea, Nausea Only and Other (See Comments)    Reaction:  Stomach pain   . Uloric [Febuxostat] Anaphylaxis    Locks pt's body up Locks pt's body up  . Aspirin Other (See Comments)     Reaction:  Stomach pain   . Celecoxib Other (See Comments)    Reaction:  GI bleed, weakness, and stomach pain.    . Cephalexin Diarrhea, Nausea Only and Other (See Comments)    Reaction:  Stomach pain   . Erythromycin Diarrhea, Nausea Only and Other (See Comments)    Reaction:  Stomach pain   . Hydromorphone Other (See Comments)    Reaction:  Hypotension   . Iodinated Diagnostic Agents Rash and Other (See Comments)    Pt states that she is unable to have because she has chronic kidney disease.    Marland Kitchen Oxycodone Other (See Comments)    Reaction:  Stomach pain   . Atorvastatin Nausea Only and Other (See Comments)    Reaction:  Weakness   . Codeine Diarrhea, Nausea Only and Other (See Comments)    Reaction:  Stomach pain   . Tape Other (See Comments)    Reaction:  Causes pts skin to tear  Pt states that she is able to use paper tape.    . Valdecoxib Swelling and Other (See Comments)    Pt states that her hands and feet swell.    . Iodine Rash    Social History   Socioeconomic History  . Marital status: Married    Spouse name: Not on file  . Number of children: Not on file  . Years of education: Not on file  . Highest education level: Not on file  Social Needs  . Financial resource strain: Not on file  . Food insecurity - worry: Not on file  . Food insecurity - inability: Not on file  . Transportation needs - medical: Not on file  . Transportation needs - non-medical: Not on file  Occupational History  . Occupation: retired  Tobacco Use  . Smoking status: Never Smoker  . Smokeless tobacco: Never Used  Substance and Sexual Activity  . Alcohol use: No  . Drug use: No  . Sexual activity: Yes  Other Topics Concern  . Not on file  Social History Narrative  . Not on file     Family History  Problem Relation Age of Onset  . Diabetes Mellitus II Mother   . CAD Mother   . Heart attack Mother   . Cancer Father        skin  . Breast cancer Neg Hx      ROS:  Please  see the history of present illness.     All other systems reviewed and negative.    Physical Exam:  Blood pressure 112/67, pulse 75, height 5\' 4"  (1.626 m), weight 250 lb 8 oz (113.6 kg). General: Well developed,Morbidly obese  female in no acute distress but with moderate discomfort trying to walk. Head: Normocephalic, atraumatic, sclera non-icteric, no xanthomas, nares are without discharge. EENT: normal  Lymph Nodes:  none Neck: Negative for carotid bruits. JVD 9-10 cm Back:without scoliosis kyphosis Lungs: Clear bilaterally to auscultation without wheezes, rales, or rhonchi. Breathing is unlabored. Heart: RRR with S1 S2. No  murmur . No rubs, or gallops appreciated. Abdomen: Soft, non-tender, non-distended with normoactive bowel sounds. No hepatomegaly. No rebound/guarding. No obvious abdominal masses. Msk:  Strength and tone appear normal for age. Extremities: No clubbing or cyanosis.  Tr edema.  Distal pedal pulses are 2+ and equal bilaterally. Skin: Warm and Dry Neuro: Alert and oriented X 3. CN III-XII intact Grossly normal sensory and motor function . Psych:  Responds to questions appropriately with a normal affect.      Labs: Cardiac Enzymes No results for input(s): CKTOTAL, CKMB, TROPONINI in the last 72 hours. CBC Lab Results  Component Value Date   WBC 5.2 06/12/2017   HGB 13.5 06/12/2017   HCT 39.4 06/12/2017   MCV 98.4 06/12/2017   PLT 135 (L) 06/12/2017   PROTIME: No results for input(s): LABPROT, INR in the last 72 hours. Chemistry No results for input(s): NA, K, CL, CO2, BUN, CREATININE, CALCIUM, PROT, BILITOT, ALKPHOS, ALT, AST, GLUCOSE in the last 168 hours.  Invalid input(s): LABALBU Lipids Lab Results  Component Value Date   CHOL 129 06/12/2017   HDL 46 06/12/2017   LDLCALC 61 06/12/2017   TRIG 110 06/12/2017   BNP No results found for: PROBNP Thyroid Function Tests: No results for input(s): TSH, T4TOTAL, T3FREE, THYROIDAB in the last 72  hours.  Invalid input(s): FREET3 Miscellaneous No results found for: DDIMER  Radiology/Studies:  No results found.  EKG: Sinus rhythm with a   Chest Xray personally reviewed single-chamber Medtronic pacemaker without evidence of pulmonary edema 9/18  Assessment and Plan:   Sinus node dysfunction  Paroxysmal atrial fibrillation/flutter with a rapid ventricular response  Cardiomyopathy-modest  Thromboembolic risk profile as noted above  GI bleeding related to polyps  Obstructive sleep apnea with sleep disordered breathing and daytime somnolence  Single-chamber pacemaker-Medtronic  Amiodarone   The patient has exercise intolerance with a number of different potential contributors.  First is ventricular pacing in the setting of sinus rhythm.  The re-engagement of atrial contractility I think offers [place] our best to single therapeutic option by upgrade of her device to a dual-chamber pacemaker as it 1) would restore AV synchrony, to potentially allow for intrinsic conduction 2)  decreased ventricular pacing which may be aggravating her cardiomyopathy, and 3) there are data that it would further decrease the atrial arrhythmia burden.  We have discussed risks and benefits. The benefits and risks were reviewed including but not limited to death,  perforation, infection, lead dislodgement and device malfunction.  The patient understands agrees and is willing to proceed.  At this juncture I would continue her on her amiodarone.  Trying to reduce the dose ongoing might be possible particularly if there is a significant trigger for atrial arrhythmias related to [place] conduction  I agree with the use of anticoagulation and we have discussed the relative consequences of thromboembolism versus recurrent GI bleeding.  She will continue on her Eliquis.  We will hold it for 4 days around the time of her procedure because of its association  with increased bleeding.     [name]

## 2017-08-24 NOTE — Telephone Encounter (Signed)
Pt calling stating she just got a call from her PCP and was placed on [employer]  She has a procedure on Monday with Dr Caryl Comes just wants to make sure this will not interfere with that   Please call back

## 2017-08-28 ENCOUNTER — Other Ambulatory Visit: Payer: Self-pay

## 2017-08-28 ENCOUNTER — Ambulatory Visit (HOSPITAL_COMMUNITY)
Admission: RE | Admit: 2017-08-28 | Discharge: 2017-08-30 | Disposition: A | Discharge status: Home / Self Care | Payer: Medicare Other | Source: Ambulatory Visit | Attending: Internal Medicine | Admitting: Internal Medicine

## 2017-08-28 ENCOUNTER — Encounter (HOSPITAL_COMMUNITY): Payer: Self-pay | Admitting: Internal Medicine

## 2017-08-28 ENCOUNTER — Encounter (HOSPITAL_COMMUNITY)
Admission: RE | Disposition: A | Discharge status: Home / Self Care | Payer: Self-pay | Source: Ambulatory Visit | Attending: Internal Medicine

## 2017-08-28 DIAGNOSIS — T82120A Displacement of cardiac electrode, initial encounter: Secondary | ICD-10-CM | POA: Insufficient documentation

## 2017-08-28 DIAGNOSIS — I429 Cardiomyopathy, unspecified: Secondary | ICD-10-CM | POA: Insufficient documentation

## 2017-08-28 DIAGNOSIS — E1122 Type 2 diabetes mellitus with diabetic chronic kidney disease: Secondary | ICD-10-CM | POA: Diagnosis not present

## 2017-08-28 DIAGNOSIS — F419 Anxiety disorder, unspecified: Secondary | ICD-10-CM | POA: Insufficient documentation

## 2017-08-28 DIAGNOSIS — Z7901 Long term (current) use of anticoagulants: Secondary | ICD-10-CM | POA: Insufficient documentation

## 2017-08-28 DIAGNOSIS — K760 Fatty (change of) liver, not elsewhere classified: Secondary | ICD-10-CM | POA: Insufficient documentation

## 2017-08-28 DIAGNOSIS — Z8673 Personal history of transient ischemic attack (TIA), and cerebral infarction without residual deficits: Secondary | ICD-10-CM | POA: Diagnosis not present

## 2017-08-28 DIAGNOSIS — M1990 Unspecified osteoarthritis, unspecified site: Secondary | ICD-10-CM | POA: Diagnosis not present

## 2017-08-28 DIAGNOSIS — M810 Age-related osteoporosis without current pathological fracture: Secondary | ICD-10-CM | POA: Insufficient documentation

## 2017-08-28 DIAGNOSIS — I495 Sick sinus syndrome: Secondary | ICD-10-CM | POA: Diagnosis present

## 2017-08-28 DIAGNOSIS — Z91048 Other nonmedicinal substance allergy status: Secondary | ICD-10-CM | POA: Insufficient documentation

## 2017-08-28 DIAGNOSIS — Z6841 Body Mass Index (BMI) 40.0 and over, adult: Secondary | ICD-10-CM

## 2017-08-28 DIAGNOSIS — Z881 Allergy status to other antibiotic agents status: Secondary | ICD-10-CM | POA: Diagnosis not present

## 2017-08-28 DIAGNOSIS — Z794 Long term (current) use of insulin: Secondary | ICD-10-CM | POA: Insufficient documentation

## 2017-08-28 DIAGNOSIS — Z95818 Presence of other cardiac implants and grafts: Secondary | ICD-10-CM

## 2017-08-28 DIAGNOSIS — I129 Hypertensive chronic kidney disease with stage 1 through stage 4 chronic kidney disease, or unspecified chronic kidney disease: Secondary | ICD-10-CM | POA: Diagnosis not present

## 2017-08-28 DIAGNOSIS — N183 Chronic kidney disease, stage 3 (moderate): Secondary | ICD-10-CM | POA: Diagnosis not present

## 2017-08-28 DIAGNOSIS — Z79899 Other long term (current) drug therapy: Secondary | ICD-10-CM | POA: Insufficient documentation

## 2017-08-28 DIAGNOSIS — I480 Paroxysmal atrial fibrillation: Secondary | ICD-10-CM | POA: Insufficient documentation

## 2017-08-28 DIAGNOSIS — Z959 Presence of cardiac and vascular implant and graft, unspecified: Secondary | ICD-10-CM

## 2017-08-28 DIAGNOSIS — Z888 Allergy status to other drugs, medicaments and biological substances status: Secondary | ICD-10-CM | POA: Insufficient documentation

## 2017-08-28 DIAGNOSIS — K219 Gastro-esophageal reflux disease without esophagitis: Secondary | ICD-10-CM | POA: Insufficient documentation

## 2017-08-28 DIAGNOSIS — M109 Gout, unspecified: Secondary | ICD-10-CM | POA: Insufficient documentation

## 2017-08-28 DIAGNOSIS — Z886 Allergy status to analgesic agent status: Secondary | ICD-10-CM | POA: Diagnosis not present

## 2017-08-28 DIAGNOSIS — Z91013 Allergy to seafood: Secondary | ICD-10-CM | POA: Diagnosis not present

## 2017-08-28 DIAGNOSIS — G4733 Obstructive sleep apnea (adult) (pediatric): Secondary | ICD-10-CM | POA: Diagnosis not present

## 2017-08-28 DIAGNOSIS — I4892 Unspecified atrial flutter: Secondary | ICD-10-CM | POA: Insufficient documentation

## 2017-08-28 HISTORY — PX: PACEMAKER REVISION: EP1221

## 2017-08-28 LAB — GLUCOSE, CAPILLARY
GLUCOSE-CAPILLARY: 121 mg/dL — AB (ref 65–99)
GLUCOSE-CAPILLARY: 223 mg/dL — AB (ref 65–99)
GLUCOSE-CAPILLARY: 239 mg/dL — AB (ref 65–99)
GLUCOSE-CAPILLARY: 411 mg/dL — AB (ref 65–99)
Glucose-Capillary: 134 mg/dL — ABNORMAL HIGH (ref 65–99)

## 2017-08-28 LAB — SURGICAL PCR SCREEN
MRSA, PCR: NEGATIVE
Staphylococcus aureus: NEGATIVE

## 2017-08-28 SURGERY — PACEMAKER REVISION

## 2017-08-28 MED ORDER — LIDOCAINE HCL (PF) 1 % IJ SOLN
INTRAMUSCULAR | Status: DC | PRN
  Administered 2017-08-28: 50 mL

## 2017-08-28 MED ORDER — HEPARIN (PORCINE) IN NACL 2-0.9 UNIT/ML-% IJ SOLN
INTRAMUSCULAR | Status: AC | PRN
  Administered 2017-08-28: 500 mL

## 2017-08-28 MED ORDER — PRAVASTATIN SODIUM 20 MG PO TABS
20.0000 mg | ORAL_TABLET | Freq: Every day | ORAL | Status: DC
Start: 2017-08-28 — End: 2017-08-30
  Administered 2017-08-28 – 2017-08-29 (×2): 20 mg via ORAL
  Filled 2017-08-28 (×2): qty 1

## 2017-08-28 MED ORDER — SODIUM CHLORIDE 0.9 % IR SOLN
Status: AC
  Filled 2017-08-28: qty 2

## 2017-08-28 MED ORDER — ACETAMINOPHEN 325 MG PO TABS
325.0000 mg | ORAL_TABLET | ORAL | Status: DC | PRN
  Administered 2017-08-28 (×3): 650 mg via ORAL
  Administered 2017-08-29: 325 mg via ORAL
  Administered 2017-08-29: 650 mg via ORAL
  Administered 2017-08-29: 325 mg via ORAL
  Filled 2017-08-28 (×6): qty 2

## 2017-08-28 MED ORDER — BUSPIRONE HCL 10 MG PO TABS
10.0000 mg | ORAL_TABLET | Freq: Two times a day (BID) | ORAL | Status: DC
  Administered 2017-08-28 – 2017-08-30 (×4): 10 mg via ORAL
  Filled 2017-08-28 (×4): qty 1

## 2017-08-28 MED ORDER — ONDANSETRON HCL 4 MG/2ML IJ SOLN
4.0000 mg | Freq: Four times a day (QID) | INTRAMUSCULAR | Status: DC | PRN
  Filled 2017-08-28: qty 2

## 2017-08-28 MED ORDER — TRAZODONE HCL 50 MG PO TABS
50.0000 mg | ORAL_TABLET | Freq: Every day | ORAL | Status: DC
  Administered 2017-08-28 – 2017-08-29 (×2): 50 mg via ORAL
  Filled 2017-08-28 (×2): qty 1

## 2017-08-28 MED ORDER — CHLORHEXIDINE GLUCONATE 4 % EX LIQD
60.0000 mL | Freq: Once | CUTANEOUS | Status: DC
  Filled 2017-08-28: qty 60

## 2017-08-28 MED ORDER — VANCOMYCIN HCL IN DEXTROSE 1-5 GM/200ML-% IV SOLN
1000.0000 mg | Freq: Two times a day (BID) | INTRAVENOUS | Status: AC
  Administered 2017-08-28: 1000 mg via INTRAVENOUS
  Filled 2017-08-28: qty 200

## 2017-08-28 MED ORDER — MUPIROCIN 2 % EX OINT
1.0000 "application " | TOPICAL_OINTMENT | Freq: Once | CUTANEOUS | Status: AC
  Administered 2017-08-28: 1 via TOPICAL
  Filled 2017-08-28: qty 22

## 2017-08-28 MED ORDER — INSULIN ASPART 100 UNIT/ML ~~LOC~~ SOLN
0.0000 [IU] | Freq: Every day | SUBCUTANEOUS | Status: DC
  Administered 2017-08-28: 5 [IU] via SUBCUTANEOUS
  Administered 2017-08-29: 3 [IU] via SUBCUTANEOUS

## 2017-08-28 MED ORDER — VITAMIN D 1000 UNITS PO TABS
1000.0000 [IU] | ORAL_TABLET | Freq: Every day | ORAL | Status: DC
  Administered 2017-08-29 – 2017-08-30 (×2): 1000 [IU] via ORAL
  Filled 2017-08-28 (×2): qty 1

## 2017-08-28 MED ORDER — AMIODARONE HCL 200 MG PO TABS
200.0000 mg | ORAL_TABLET | Freq: Every day | ORAL | Status: DC
  Administered 2017-08-29: 200 mg via ORAL
  Filled 2017-08-28 (×2): qty 1

## 2017-08-28 MED ORDER — MUPIROCIN 2 % EX OINT
TOPICAL_OINTMENT | CUTANEOUS | Status: AC
  Filled 2017-08-28: qty 22

## 2017-08-28 MED ORDER — DOCUSATE SODIUM 100 MG PO CAPS
100.0000 mg | ORAL_CAPSULE | Freq: Every day | ORAL | Status: DC
  Administered 2017-08-28 – 2017-08-29 (×2): 100 mg via ORAL
  Filled 2017-08-28 (×2): qty 1

## 2017-08-28 MED ORDER — FENTANYL CITRATE (PF) 100 MCG/2ML IJ SOLN
INTRAMUSCULAR | Status: AC
  Filled 2017-08-28: qty 2

## 2017-08-28 MED ORDER — FAMOTIDINE IN NACL 20-0.9 MG/50ML-% IV SOLN
INTRAVENOUS | Status: AC
  Filled 2017-08-28: qty 50

## 2017-08-28 MED ORDER — FENTANYL CITRATE (PF) 100 MCG/2ML IJ SOLN
INTRAMUSCULAR | Status: DC | PRN
  Administered 2017-08-28 (×2): 25 ug via INTRAVENOUS

## 2017-08-28 MED ORDER — MIDAZOLAM HCL 5 MG/5ML IJ SOLN
INTRAMUSCULAR | Status: AC
  Filled 2017-08-28: qty 5

## 2017-08-28 MED ORDER — ALLOPURINOL 300 MG PO TABS
150.0000 mg | ORAL_TABLET | Freq: Every day | ORAL | Status: DC
  Administered 2017-08-29 – 2017-08-30 (×2): 150 mg via ORAL
  Filled 2017-08-28 (×2): qty 1

## 2017-08-28 MED ORDER — TALC EX POWD: 1.0000 "application " | CUTANEOUS | Status: DC | PRN

## 2017-08-28 MED ORDER — PAROXETINE HCL 20 MG PO TABS
20.0000 mg | ORAL_TABLET | Freq: Every day | ORAL | Status: DC
  Administered 2017-08-29 – 2017-08-30 (×2): 20 mg via ORAL
  Filled 2017-08-28 (×2): qty 1

## 2017-08-28 MED ORDER — DIPHENHYDRAMINE HCL 50 MG/ML IJ SOLN
INTRAMUSCULAR | Status: AC
  Filled 2017-08-28: qty 1

## 2017-08-28 MED ORDER — SODIUM CHLORIDE 0.9 % IV SOLN
INTRAVENOUS | Status: DC
  Administered 2017-08-28: 08:00:00 via INTRAVENOUS

## 2017-08-28 MED ORDER — GENTAMICIN SULFATE 40 MG/ML IJ SOLN
80.0000 mg | INTRAMUSCULAR | Status: AC
  Administered 2017-08-28: 80 mg
  Filled 2017-08-28: qty 2

## 2017-08-28 MED ORDER — SODIUM CHLORIDE 0.9 % IV SOLN: INTRAVENOUS | Status: AC

## 2017-08-28 MED ORDER — DIPHENHYDRAMINE HCL 50 MG/ML IJ SOLN
50.0000 mg | Freq: Once | INTRAMUSCULAR | Status: AC
  Administered 2017-08-28: 50 mg via INTRAVENOUS

## 2017-08-28 MED ORDER — PANTOPRAZOLE SODIUM 40 MG PO TBEC
40.0000 mg | DELAYED_RELEASE_TABLET | Freq: Two times a day (BID) | ORAL | Status: DC
  Administered 2017-08-28 – 2017-08-30 (×4): 40 mg via ORAL
  Filled 2017-08-28 (×4): qty 1

## 2017-08-28 MED ORDER — INSULIN ASPART 100 UNIT/ML ~~LOC~~ SOLN
0.0000 [IU] | Freq: Three times a day (TID) | SUBCUTANEOUS | Status: DC
  Administered 2017-08-29: 5 [IU] via SUBCUTANEOUS
  Administered 2017-08-29: 1 [IU] via SUBCUTANEOUS
  Administered 2017-08-30: 5 [IU] via SUBCUTANEOUS

## 2017-08-28 MED ORDER — SODIUM CHLORIDE 0.9 % IV SOLN: INTRAVENOUS | Status: DC

## 2017-08-28 MED ORDER — ACETAMINOPHEN 500 MG PO TABS
1000.0000 mg | ORAL_TABLET | Freq: Four times a day (QID) | ORAL | Status: DC | PRN
  Administered 2017-08-30: 1000 mg via ORAL
  Filled 2017-08-28: qty 2

## 2017-08-28 MED ORDER — COLCHICINE 0.6 MG PO TABS: 0.6000 mg | ORAL_TABLET | Freq: Every day | ORAL | Status: DC | PRN

## 2017-08-28 MED ORDER — LIDOCAINE HCL (PF) 1 % IJ SOLN
INTRAMUSCULAR | Status: AC
  Filled 2017-08-28: qty 60

## 2017-08-28 MED ORDER — VANCOMYCIN HCL 10 G IV SOLR
1500.0000 mg | INTRAVENOUS | Status: AC
  Administered 2017-08-28: 1500 mg via INTRAVENOUS
  Filled 2017-08-28: qty 1500

## 2017-08-28 MED ORDER — FAMOTIDINE IN NACL 20-0.9 MG/50ML-% IV SOLN
20.0000 mg | Freq: Once | INTRAVENOUS | Status: AC
  Administered 2017-08-28: 20 mg via INTRAVENOUS

## 2017-08-28 MED ORDER — MIDAZOLAM HCL 5 MG/5ML IJ SOLN
INTRAMUSCULAR | Status: DC | PRN
  Administered 2017-08-28 (×3): 1 mg via INTRAVENOUS

## 2017-08-28 SURGICAL SUPPLY — 8 items
CABLE SURGICAL S-101-97-12 (CABLE) IMPLANT
HEMOSTAT SURGICEL 2X4 FIBR (HEMOSTASIS) ×2 IMPLANT
IPG PACE AZUR XT DR MRI W1DR01 (Pacemaker) ×1 IMPLANT
LEAD CAPSURE NOVUS 45CM (Lead) ×2 IMPLANT
PACE AZURE XT DR MRI W1DR01 (Pacemaker) ×2 IMPLANT
PAD DEFIB LIFELINK (PAD) IMPLANT
SHEATH CLASSIC 7F (SHEATH) ×2 IMPLANT
TRAY PACEMAKER INSERTION (PACKS) IMPLANT

## 2017-08-28 NOTE — Interval H&P Note (Signed)
History and Physical Interval Note:  08/28/2017 8:32 AM  Deborah Obrien  has presented today for surgery, with the diagnosis of pacemaker syndrome  The various methods of treatment have been discussed with the patient and family. After consideration of risks, benefits and other options for treatment, the patient has consented to  Procedure(s): PACEMAKER REVISION (N/A) as a surgical intervention .  The patient's history has been reviewed, patient examined, no change in status, stable for surgery.  I have reviewed the patient's chart and labs.  Questions were answered to the patient's satisfaction.     [name]

## 2017-08-29 ENCOUNTER — Other Ambulatory Visit: Payer: Self-pay

## 2017-08-29 ENCOUNTER — Encounter (HOSPITAL_COMMUNITY)
Admission: RE | Disposition: A | Discharge status: Home / Self Care | Payer: Self-pay | Source: Ambulatory Visit | Attending: Internal Medicine

## 2017-08-29 ENCOUNTER — Ambulatory Visit (HOSPITAL_COMMUNITY): Payer: Medicare Other

## 2017-08-29 DIAGNOSIS — I495 Sick sinus syndrome: Secondary | ICD-10-CM | POA: Diagnosis not present

## 2017-08-29 DIAGNOSIS — I480 Paroxysmal atrial fibrillation: Secondary | ICD-10-CM | POA: Diagnosis not present

## 2017-08-29 DIAGNOSIS — I129 Hypertensive chronic kidney disease with stage 1 through stage 4 chronic kidney disease, or unspecified chronic kidney disease: Secondary | ICD-10-CM | POA: Diagnosis not present

## 2017-08-29 DIAGNOSIS — T82120A Displacement of cardiac electrode, initial encounter: Secondary | ICD-10-CM | POA: Diagnosis not present

## 2017-08-29 HISTORY — PX: LEAD REVISION/REPAIR: EP1213

## 2017-08-29 LAB — GLUCOSE, CAPILLARY
GLUCOSE-CAPILLARY: 256 mg/dL — AB (ref 65–99)
Glucose-Capillary: 184 mg/dL — ABNORMAL HIGH (ref 65–99)
Glucose-Capillary: 144 mg/dL — ABNORMAL HIGH (ref 65–99)
Glucose-Capillary: 276 mg/dL — ABNORMAL HIGH (ref 65–99)

## 2017-08-29 SURGERY — LEAD REVISION/REPAIR

## 2017-08-29 MED ORDER — CHLORHEXIDINE GLUCONATE 4 % EX LIQD
60.0000 mL | Freq: Once | CUTANEOUS | Status: AC
  Filled 2017-08-29: qty 15

## 2017-08-29 MED ORDER — SODIUM CHLORIDE 0.9 % IV SOLN: INTRAVENOUS | Status: DC

## 2017-08-29 MED ORDER — CHLORHEXIDINE GLUCONATE 4 % EX LIQD
60.0000 mL | Freq: Once | CUTANEOUS | Status: AC
  Administered 2017-08-29: 4 via TOPICAL

## 2017-08-29 MED ORDER — MORPHINE SULFATE (PF) 2 MG/ML IV SOLN
2.0000 mg | Freq: Once | INTRAVENOUS | Status: AC
  Administered 2017-08-29: 2 mg via INTRAVENOUS
  Filled 2017-08-29: qty 1

## 2017-08-29 MED ORDER — FENTANYL CITRATE (PF) 100 MCG/2ML IJ SOLN
INTRAMUSCULAR | Status: AC
  Filled 2017-08-29: qty 2

## 2017-08-29 MED ORDER — VANCOMYCIN HCL 10 G IV SOLR: 1500.0000 mg | Freq: Two times a day (BID) | INTRAVENOUS | Status: DC

## 2017-08-29 MED ORDER — VANCOMYCIN HCL 10 G IV SOLR
1500.0000 mg | INTRAVENOUS | Status: DC
  Administered 2017-08-29: 1500 mg via INTRAVENOUS
  Filled 2017-08-29 (×2): qty 1500

## 2017-08-29 MED ORDER — SODIUM CHLORIDE 0.9% FLUSH: 3.0000 mL | INTRAVENOUS | Status: DC | PRN

## 2017-08-29 MED ORDER — BUPIVACAINE HCL (PF) 0.25 % IJ SOLN
INTRAMUSCULAR | Status: DC | PRN
  Administered 2017-08-29: 45 mL

## 2017-08-29 MED ORDER — MIDAZOLAM HCL 5 MG/5ML IJ SOLN
INTRAMUSCULAR | Status: AC
  Filled 2017-08-29: qty 5

## 2017-08-29 MED ORDER — BUPIVACAINE HCL (PF) 0.25 % IJ SOLN
INTRAMUSCULAR | Status: AC
  Filled 2017-08-29: qty 60

## 2017-08-29 MED ORDER — VANCOMYCIN HCL IN DEXTROSE 1-5 GM/200ML-% IV SOLN
INTRAVENOUS | Status: AC
  Filled 2017-08-29: qty 200

## 2017-08-29 MED ORDER — MIDAZOLAM HCL 5 MG/5ML IJ SOLN
INTRAMUSCULAR | Status: DC | PRN
  Administered 2017-08-29 (×4): 1 mg via INTRAVENOUS

## 2017-08-29 MED ORDER — HEPARIN (PORCINE) IN NACL 2-0.9 UNIT/ML-% IJ SOLN
INTRAMUSCULAR | Status: AC
  Filled 2017-08-29: qty 500

## 2017-08-29 MED ORDER — SODIUM CHLORIDE 0.9 % IR SOLN
Status: AC
  Filled 2017-08-29: qty 2

## 2017-08-29 MED ORDER — SODIUM CHLORIDE 0.9 % IR SOLN
80.0000 mg | Status: AC
  Administered 2017-08-29: 80 mg
  Filled 2017-08-29: qty 2

## 2017-08-29 MED ORDER — FENTANYL CITRATE (PF) 100 MCG/2ML IJ SOLN
INTRAMUSCULAR | Status: DC | PRN
  Administered 2017-08-29 (×8): 25 ug via INTRAVENOUS

## 2017-08-29 MED ORDER — SODIUM CHLORIDE 0.9 % IV SOLN: 250.0000 mL | INTRAVENOUS | Status: DC

## 2017-08-29 MED ORDER — SODIUM CHLORIDE 0.9% FLUSH
3.0000 mL | Freq: Two times a day (BID) | INTRAVENOUS | Status: DC
Start: 2017-08-29 — End: 2017-08-29

## 2017-08-29 SURGICAL SUPPLY — 4 items
CABLE SURGICAL S-101-97-12 (CABLE) ×2 IMPLANT
HOVERMATT SINGLE USE (MISCELLANEOUS) ×2 IMPLANT
PAD DEFIB LIFELINK (PAD) ×2 IMPLANT
TRAY PACEMAKER INSERTION (PACKS) ×2 IMPLANT

## 2017-08-29 NOTE — Progress Notes (Signed)
Orthopedic Tech Progress Note Patient Details:  Deborah Obrien 06-28-1941 [id number] Patient already has arm sling. Patient ID: [name], female   DOB: [birth year], 76 y.o.   MRN: [id number]   Braulio Bosch 08/29/2017, 4:26 PM

## 2017-08-29 NOTE — Progress Notes (Signed)
Patient has signed consent form. Hibiclens bath given. Surgical PCR completed yesterday. Pt has been NPO.

## 2017-08-29 NOTE — Discharge Summary (Signed)
ELECTROPHYSIOLOGY PROCEDURE DISCHARGE SUMMARY    Patient ID: Deborah Obrien,  MRN: [id number], DOB/AGE: 1940-11-27 76 y.o.  Admit date: 08/28/2017 Discharge date: 08/30/18  Primary Care Physician: [name], MD  Primary Cardiologist: Dr. Nehemiah Massed Electrophysiologist: [name]  Primary Discharge Diagnosis:  1. Sinus node dysfunction 2. Paroxysmal AFib     CHA2DS2Vasc is 7, on Eliquis  Secondary Discharge Diagnosis:  1. HTN 2. 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 CRI (III)  Allergies  Allergen Reactions  . Cephalosporins Other (See Comments)    Reaction:  Unknown   . Macrolides And Ketolides Other (See Comments)    Reaction:  Unknown   . Meperidine Shortness Of Breath, Nausea Only and Other (See Comments)    Reaction:  Stomach pain   . Prednisone Anaphylaxis and Other (See Comments)    Reaction:  Increases pts blood sugar  Pt states that she is allergic to all steroids.    . Shellfish Allergy Anaphylaxis, Shortness Of Breath, Diarrhea, Nausea Only and Rash    Reaction:  Stomach pain   . Sulfa Antibiotics Shortness Of Breath, Diarrhea, Nausea Only and Other (See Comments)    Reaction:  Stomach pain   . Uloric [Febuxostat] Anaphylaxis    Locks pt's body up   . Aspirin Other (See Comments)    Reaction:  Stomach pain   . Celecoxib Other (See Comments)    Reaction:  GI bleed, weakness, and stomach pain.    . Cephalexin Diarrhea, Nausea Only and Other (See Comments)    Reaction:  Stomach pain   . Erythromycin Diarrhea, Nausea Only and Other (See Comments)    Reaction:  Stomach pain   . Hydromorphone Other (See Comments)    Reaction:  Hypotension   . Iodinated Diagnostic Agents Rash and Other (See Comments)    Pt states that she is unable to have because she has chronic kidney disease.    Marland Kitchen Oxycodone Other (See Comments)    Reaction:  Stomach pain   . Atorvastatin Nausea Only and Other (See Comments)    Reaction:  Weakness   . Codeine Diarrhea, Nausea Only and  Other (See Comments)    Reaction:  Stomach pain Pt tolerates morphine   . Doxycycline     Stomach pain   . Tape Other (See Comments)    Reaction:  Causes pts skin to tear  Pt states that she is able to use paper tape.    . Valdecoxib Swelling and Other (See Comments)    Pt states that her hands and feet swell.    . Iodine Rash     Procedures This Admission:  1.  Implantation of a MDT RV lead (addition to chronic RV) and new generator on 08/28/17 by Dr Caryl Comes.  The patient received a Medtronic MRI compatible 5076 lead [id number] MRI compatible pulse generator, serial number [id number], chronic RV lead was tested.  There were no immediate post procedure complications. 2.  CXR on 08/29/17 demonstrated no pneumothorax status post device implantation.  3. RV/RA lead revision 08/29/17 with Dr. Curt Bears     CONCLUSIONS:   1. Successful implantation of a Medtronic Azure XT DR MRI SureScan dual-chamber pacemaker for symptomatic bradycardia  2. No early apparent complications  Brief HPI: Deborah Obrien is a 76 y.o. female was referred to electrophysiology in the outpatient setting for evaluation her PAF and exercise intolerance, this though perhaps 2/2 loss of AV synchrony with VVI pacing with SR and recommended implant of RA lead  to .  Past medical history is noted above.   Risks, benefits, and alternatives to PPM implantation were reviewed with the patient who wished to proceed.   Hospital Course:  The patient was admitted and underwent implantation of a RA lead with details as outlined above. She was monitored on telemetry overnight which demonstrated AP/VS rhythm.  Left chest was without hematoma or ecchymosis.  The device was interrogated and found to be functioning normally.  CXR was obtained and demonstrated no pneumothorax status post device implantation, though all slack had been lost on the chronic RV lead.  Dr Caryl Comes discussed at length with the patient, and was decided to  pursue lead revision prior to discharge.  This was done with Dr. Curt Bears 08/29/17.   Wound care, arm mobility, and restrictions were reviewed with the patient.  The patient was examined by Dr.Oracio Galen the day of discharge and considered stable for discharge to home.   The patient requested pain medicine for home, she confirms that she is able to take Norco without reaction and [name] approved 2 days home use supply.  I discussed not to exceed dosing with Tylenol, she is familiar with this recommendation from past pain medicine use.  He also discussed with the patient, will stop her amiodarone and follow via her device her AF burden, and to hold Eliquis 3 days post implant.  [place] search shows no active or current narcotic prescriptions   Physical Exam: Vitals:   08/29/17 1937 08/30/17 0045 08/30/17 0645 08/30/17 0825  BP: (!) 118/46 (!) 125/51 (!) 129/51 (!) 133/45  Pulse: 60 62 60 60  Resp: 18 18 18    Temp: 97.7 F (36.5 C) 97.9 F (36.6 C) 98 F (36.7 C)   TempSrc: Oral Oral Oral   SpO2: 97% 96% 96% 100%  Weight:   248 lb 3.8 oz (112.6 kg)   Height:        GEN:No acute distress.   Neck:No JVD Cardiac: RRR, no murmurs, rubs, or gallops.  Respiratory:CTA b/l. ZR:AQTM, nontender, non-distended  MS:No edema; No deformity. Neuro:Nonfocal  Psych: Normal affect  PPM site: is dry, no bleeding or hematoma    Labs:   Lab Results  Component Value Date   WBC 4.5 08/24/2017   HGB 13.5 08/24/2017   HCT 41.4 08/24/2017   MCV 100.1 (H) 08/24/2017   PLT 162 08/24/2017    Recent Labs  Lab 08/24/17 1110  NA 138  K 4.5  CL 105  CO2 25  BUN 27*  CREATININE 1.32*  CALCIUM 9.0  GLUCOSE 151*    Discharge Medications:  Allergies as of 08/30/2017      Reactions   Cephalosporins Other (See Comments)   Reaction:  Unknown    Macrolides And Ketolides Other (See Comments)   Reaction:  Unknown    Meperidine Shortness Of Breath, Nausea Only, Other (See Comments)    Reaction:  Stomach pain    Prednisone Anaphylaxis, Other (See Comments)   Reaction:  Increases pts blood sugar  Pt states that she is allergic to all steroids.     Shellfish Allergy Anaphylaxis, Shortness Of Breath, Diarrhea, Nausea Only, Rash   Reaction:  Stomach pain    Sulfa Antibiotics Shortness Of Breath, Diarrhea, Nausea Only, Other (See Comments)   Reaction:  Stomach pain    Uloric [febuxostat] Anaphylaxis   Locks pt's body up   Aspirin Other (See Comments)   Reaction:  Stomach pain    Celecoxib Other (See Comments)   Reaction:  GI bleed, weakness, and stomach pain.     Cephalexin Diarrhea, Nausea Only, Other (See Comments)   Reaction:  Stomach pain    Erythromycin Diarrhea, Nausea Only, Other (See Comments)   Reaction:  Stomach pain    Hydromorphone Other (See Comments)   Reaction:  Hypotension    Iodinated Diagnostic Agents Rash, Other (See Comments)   Pt states that she is unable to have because she has chronic kidney disease.     Oxycodone Other (See Comments)   Reaction:  Stomach pain    Atorvastatin Nausea Only, Other (See Comments)   Reaction:  Weakness    Codeine Diarrhea, Nausea Only, Other (See Comments)   Reaction:  Stomach pain Pt tolerates morphine    Doxycycline    Stomach pain    Tape Other (See Comments)   Reaction:  Causes pts skin to tear  Pt states that she is able to use paper tape.     Valdecoxib Swelling, Other (See Comments)   Pt states that her hands and feet swell.     Iodine Rash      Medication List    STOP taking these medications   amiodarone 200 MG tablet Commonly known as:  PACERONE     TAKE these medications   acetaminophen 500 MG tablet Commonly known as:  TYLENOL Take 1,000 mg by mouth every 6 (six) hours as needed for mild pain or moderate pain.   allopurinol 100 MG tablet Commonly known as:  ZYLOPRIM Take 150 mg by mouth daily.   apixaban 5 MG Tabs tablet Commonly known as:  ELIQUIS Take 1 tablet (5 mg total) by mouth  2 (two) times daily. Notes to patient:  Resume 09/01/17 morning   busPIRone 10 MG tablet Commonly known as:  BUSPAR Take 10 mg by mouth 2 (two) times daily.   cholecalciferol 1000 units tablet Commonly known as:  VITAMIN D Take 1,000 Units by mouth daily.   colchicine 0.6 MG tablet Take 0.6 mg by mouth daily as needed (gout).   docusate sodium 100 MG capsule Commonly known as:  COLACE Take 100 mg by mouth at bedtime.   HYDROcodone-acetaminophen 5-325 MG tablet Commonly known as:  NORCO/VICODIN Take 1 tablet by mouth every 6 (six) hours as needed for moderate pain.   hydrocortisone 2.5 % cream Apply 1 application topically daily as needed (rash).   insulin NPH-regular Human (70-30) 100 UNIT/ML injection Commonly known as:  NOVOLIN 70/30 Inject 22 Units into the skin 2 (two) times daily before a meal.   ketoconazole 2 % cream Commonly known as:  NIZORAL Apply 1 application topically every 2 (two) hours as needed (for rash under breasts).   oxybutynin 5 MG tablet Commonly known as:  DITROPAN Take 0.5 tablets (2.5 mg total) by mouth at bedtime. What changed:  how much to take   pantoprazole 40 MG tablet Commonly known as:  PROTONIX Take 40 mg by mouth 2 (two) times daily.   PARoxetine 20 MG tablet Commonly known as:  PAXIL Take 20 mg by mouth daily.   pravastatin 20 MG tablet Commonly known as:  PRAVACHOL Take 20 mg by mouth at bedtime.   SYSTANE OP Apply 1 drop to eye daily as needed (dry eyes).   traZODone 50 MG tablet Commonly known as:  DESYREL Take 1 tablet (50 mg total) by mouth at bedtime.   ZEASORB powder Generic drug:  talc Apply 1 application topically as needed (under the breast irritation).  Disposition:  Home   Follow-up Information    [facility] Follow up on 09/13/2017.   Specialty:  Cardiology Why:  11:30AM, wound check visit Contact information: [address], [address]  [place]       Deboraha Sprang, MD Follow up on 11/28/2017.   Specialty:  Cardiology Why:  9:30AM Contact information: [place] [address] [phone]           Duration of Discharge Encounter: Greater than 30 minutes including physician time.  Venetia Night, PA-C 08/30/2017 9:37 AM  Pt seen and examined    Instructions given

## 2017-08-29 NOTE — Discharge Instructions (Signed)
° ° °  Supplemental Discharge Instructions for  Pacemaker/Defibrillator Patients  Activity No heavy lifting or vigorous activity with your left/right arm for 6 to 8 weeks.  Do not raise your left/right arm above your head for one week.  Gradually raise your affected arm as drawn below.             09/02/17                  09/03/17                   09/04/17                 09/05/17 __  NO DRIVING for 1 week  ; you may begin driving on  00/76/22  .  WOUND CARE - Keep the wound area clean and dry.  Do not get this area wet, no showers until cleared to at your wound check visit  . - The tape/steri-strips on your wound will fall off; do not pull them off.  No bandage is needed on the site.  DO  NOT apply any creams, oils, or ointments to the wound area. - If you notice any drainage or discharge from the wound, any swelling or bruising at the site, or you develop a fever > 101? F after you are discharged home, call the office at once.  Special Instructions - You are still able to use cellular telephones; use the ear opposite the side where you have your pacemaker/defibrillator.  Avoid carrying your cellular phone near your device. - When traveling through airports, show security personnel your identification card to avoid being screened in the metal detectors.  Ask the security personnel to use the hand wand. - Avoid arc welding equipment, MRI testing (magnetic resonance imaging), TENS units (transcutaneous nerve stimulators).  Call the office for questions about other devices. - Avoid electrical appliances that are in poor condition or are not properly grounded. - Microwave ovens are safe to be near or to operate.  Additional information for defibrillator patients should your device go off: - If your device goes off ONCE and you feel fine afterward, notify the device clinic nurses. - If your device goes off ONCE and you do not feel well afterward, call 911. - If your device goes off TWICE,  call 911. - If your device goes off THREE times in one day, call 911.  DO NOT DRIVE YOURSELF OR A FAMILY MEMBER WITH A DEFIBRILLATOR TO THE HOSPITAL--CALL 911.

## 2017-08-29 NOTE — Progress Notes (Signed)
MD -- please evaluate this mornings ekg.  Dr. Teena Dunk ordered Morphine 1x dose I v for pain relief of 10/10 to Sx area,

## 2017-08-29 NOTE — Progress Notes (Signed)
Progress Note  Patient Name: Deborah Obrien Date of Encounter: 08/29/2017  Primary Cardiologist: Caryl Comes  Subjective   Tender/discomfort at pacer site, no CP otherwise, no SOB  Inpatient Medications    Scheduled Meds: . allopurinol  150 mg Oral Daily  . amiodarone  200 mg Oral Daily  . busPIRone  10 mg Oral BID  . cholecalciferol  1,000 Units Oral Daily  . docusate sodium  100 mg Oral QHS  . insulin aspart  0-5 Units Subcutaneous QHS  . insulin aspart  0-9 Units Subcutaneous TID WC  . pantoprazole  40 mg Oral BID  . PARoxetine  20 mg Oral Daily  . pravastatin  20 mg Oral QHS  . traZODone  50 mg Oral QHS   Continuous Infusions:  PRN Meds: acetaminophen, acetaminophen, colchicine, ondansetron (ZOFRAN) IV, talc   Vital Signs    Vitals:   08/28/17 1315 08/28/17 1345 08/28/17 2135 08/29/17 0510  BP: (!) 95/42 (!) 114/38 126/68 (!) 127/55  Pulse:  60 63 60  Resp:   18 18  Temp:   98.2 F (36.8 C) 98.3 F (36.8 C)  TempSrc:   Oral Oral  SpO2:   96% 98%  Weight:    247 lb 4.8 oz (112.2 kg)  Height:        Intake/Output Summary (Last 24 hours) at 08/29/2017 0849 Last data filed at 08/29/2017 4401 Gross per 24 hour  Intake 1250 ml  Output 2550 ml  Net -1300 ml   Filed Weights   08/28/17 0634 08/29/17 0510  Weight: 250 lb (113.4 kg) 247 lb 4.8 oz (112.2 kg)    Telemetry    Generally AP/VS - Personally Reviewed  ECG    AP, T changes 2/2 memory - Personally Reviewed  Physical Exam   GEN: No acute distress.   Neck: No JVD Cardiac: RRR, no murmurs, rubs, or gallops.  Respiratory: Clear to auscultation bilaterally. GI: Soft, nontender, non-distended  MS: No edema; No deformity. Neuro:  Nonfocal  Psych: Normal affect   Labs    Chemistry Recent Labs  Lab 08/24/17 1110  NA 138  K 4.5  CL 105  CO2 25  GLUCOSE 151*  BUN 27*  CREATININE 1.32*  CALCIUM 9.0  GFRNONAA 38*  GFRAA 44*  ANIONGAP 8     Hematology Recent Labs  Lab  08/24/17 1110  WBC 4.5  RBC 4.14  HGB 13.5  HCT 41.4  MCV 100.1*  MCH 32.6  MCHC 32.6  RDW 13.7  PLT 162    Cardiac EnzymesNo results for input(s): TROPONINI in the last 168 hours. No results for input(s): TROPIPOC in the last 168 hours.   BNPNo results for input(s): BNP, PROBNP in the last 168 hours.   DDimer No results for input(s): DDIMER in the last 168 hours.   Radiology    No results found.  Cardiac Studies   08/25/16: LHC Conclusion   There is mild (2+) mitral regurgitation.  Ost LM to LM lesion, 25 %stenosed.  Mid LAD lesion, 20 %stenosed.  Ost Cx to Prox Cx lesion, 15 %stenosed. Assessment The patient has had progressive canadian class 3 anginal symptoms with a high probability stress test with risk factors including high blood pressure and high cholesterol. normal left ventricular function with ejection fraction of 55% mild 2 vessel coronary artery disease  There is not significant stenosis of coronaries Plan Continue medical management of CAD risk factors and No further cardiac intervention at this time  Patient Profile     76 y.o. female here for elective RA lead placement, hx of SN dysfunction, PAFib  Assessment & Plan    1. Sinus node dysfunction     Hx of PPM  2. Paroxysmal AFib     CHA2DS2Vasc is 6 on Eliquis     Per 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 has discussed at length this morning with the patient and her husband at bedside, felt best to bring back to the lab to give slack back to RV lead.  Discussed procedure, risks, benefits, the patient and husband feel best about doing lead reposition/advancement.  Dr. Curt Bears Onesha Krebbs see the patient.  Lyriq Finerty decide  potential discharge post procedure today ?tomorrow    For questions or updates, please contact [facility] Please consult [email or web address] for contact info under Cardiology/STEMI.      Signed, Baldwin [place], PA-C  08/29/2017, 8:49 AM    I have seen and examined this patient with Tommye Standard.  Agree with above, note added to reflect my findings.  On exam, RRR, no murmurs, lungs clear.   Had A lead addition to single chamber pacemaker. Device functioning appropriately, but slack out of both RA and RV leads. Plan for lead revision. Risks and benefits discussed. Risks include but not limited to bleeding, infection, tamponade, pneumothorax. The patient understands the risks and has agreed to the procedure.  Jeydi Klingel M. Deina Lipsey MD 08/29/2017 1:19 PM

## 2017-08-30 ENCOUNTER — Ambulatory Visit (HOSPITAL_COMMUNITY): Payer: Medicare Other

## 2017-08-30 ENCOUNTER — Encounter (HOSPITAL_COMMUNITY): Payer: Self-pay | Admitting: Cardiology

## 2017-08-30 DIAGNOSIS — I480 Paroxysmal atrial fibrillation: Secondary | ICD-10-CM | POA: Diagnosis not present

## 2017-08-30 DIAGNOSIS — I129 Hypertensive chronic kidney disease with stage 1 through stage 4 chronic kidney disease, or unspecified chronic kidney disease: Secondary | ICD-10-CM | POA: Diagnosis not present

## 2017-08-30 DIAGNOSIS — I495 Sick sinus syndrome: Secondary | ICD-10-CM | POA: Diagnosis not present

## 2017-08-30 DIAGNOSIS — T82120A Displacement of cardiac electrode, initial encounter: Secondary | ICD-10-CM | POA: Diagnosis not present

## 2017-08-30 LAB — GLUCOSE, CAPILLARY: Glucose-Capillary: 266 mg/dL — ABNORMAL HIGH (ref 65–99)

## 2017-08-30 MED ORDER — HYDROCODONE-ACETAMINOPHEN 5-325 MG PO TABS: 1.0000 | ORAL_TABLET | Freq: Four times a day (QID) | ORAL | 0 refills | Status: DC | PRN

## 2017-08-30 MED ORDER — HYDROCODONE-ACETAMINOPHEN 5-325 MG PO TABS
1.0000 | ORAL_TABLET | Freq: Four times a day (QID) | ORAL | Status: DC | PRN
  Administered 2017-08-30: 1 via ORAL
  Filled 2017-08-30: qty 1

## 2017-08-30 NOTE — Progress Notes (Signed)
Reviewed discharge instructions/medications with pt and pt's husband.  Answered their questions. Pt is stable and ready for discharge.

## 2017-09-12 HISTORY — PX: BACK SURGERY: SHX140

## 2017-09-13 ENCOUNTER — Ambulatory Visit (INDEPENDENT_AMBULATORY_CARE_PROVIDER_SITE_OTHER): Payer: Medicare Other | Admitting: *Deleted

## 2017-09-13 ENCOUNTER — Telehealth: Payer: Self-pay | Admitting: *Deleted

## 2017-09-13 DIAGNOSIS — Z950 Presence of cardiac pacemaker: Secondary | ICD-10-CM | POA: Diagnosis not present

## 2017-09-13 DIAGNOSIS — I442 Atrioventricular block, complete: Secondary | ICD-10-CM

## 2017-09-13 DIAGNOSIS — I495 Sick sinus syndrome: Secondary | ICD-10-CM

## 2017-09-13 LAB — CUP PACEART INCLINIC DEVICE CHECK
Battery Voltage: 3.19 V
Brady Statistic AS VP Percent: 0 %
Brady Statistic AS VS Percent: 0.16 %
Brady Statistic RA Percent Paced: 99.83 %
Brady Statistic RV Percent Paced: 0.35 %
Date Time Interrogation Session: 20190102153607
Implantable Lead Implant Date: 20181217
Implantable Lead Location: 753860
Implantable Lead Model: 5076
Implantable Lead Model: 5076
Implantable Pulse Generator Implant Date: 20181217
Lead Channel Impedance Value: 342 Ohm
Lead Channel Impedance Value: 361 Ohm
Lead Channel Pacing Threshold Amplitude: 1 V
Lead Channel Pacing Threshold Pulse Width: 0.4 ms
Lead Channel Pacing Threshold Pulse Width: 0.4 ms
Lead Channel Setting Pacing Pulse Width: 0.4 ms
MDC IDC LEAD IMPLANT DT: 20181217
MDC IDC LEAD LOCATION: 753859
MDC IDC MSMT BATTERY REMAINING LONGEVITY: 141 mo
MDC IDC MSMT LEADCHNL RA IMPEDANCE VALUE: 418 Ohm
MDC IDC MSMT LEADCHNL RV IMPEDANCE VALUE: 456 Ohm
MDC IDC MSMT LEADCHNL RV PACING THRESHOLD AMPLITUDE: 0.5 V
MDC IDC MSMT LEADCHNL RV SENSING INTR AMPL: 8.25 mV
MDC IDC SET LEADCHNL RA PACING AMPLITUDE: 3.5 V
MDC IDC SET LEADCHNL RV PACING AMPLITUDE: 3.5 V
MDC IDC SET LEADCHNL RV SENSING SENSITIVITY: 1.2 mV
MDC IDC STAT BRADY AP VP PERCENT: 0.35 %
MDC IDC STAT BRADY AP VS PERCENT: 99.48 %

## 2017-09-13 NOTE — Telephone Encounter (Signed)
At wound check appointment on 09/13/17, patient expressed that she would like to follow with [name] at the [facility] office indefinitely.  She was previously followed by the [facility] in [place].  Per [name], will discuss whether she needs a primary cardiologist in addition to an EP at her appointment with him on 11/28/17.  Patient verbalizes agreement with plan and denies additional questions or concerns at this time.

## 2017-09-13 NOTE — Progress Notes (Signed)
Wound check appointment. Steri-strips removed. Wound without redness or edema. Incision edges approximated, wound well healed. Normal device function. Thresholds, sensing, and impedances consistent with implant measurements. Device programmed at 3.5V with auto capture programmed on for extra safety margin until 3 month visit. Histogram distribution appropriate for patient and level of activity. No mode switches or high ventricular rates noted. Patient educated about wound care, arm mobility, lifting restrictions, and Carelink monitor. Carelink monitoring transfer requested from [facility] per patient request. ROV with SK/B on 11/28/17.

## 2017-09-22 DIAGNOSIS — Z0000 Encounter for general adult medical examination without abnormal findings: Secondary | ICD-10-CM | POA: Insufficient documentation

## 2017-10-13 ENCOUNTER — Telehealth: Payer: Self-pay | Admitting: *Deleted

## 2017-10-13 NOTE — Telephone Encounter (Signed)
Spoke with patient to advise that Carelink monitoring was successfully transferred from [facility].  Requested monitor SN to update info in our system.  Patient read off SN and it was successfully entered in [employer].  Patient is appreciative and denies questions or concerns at this time.

## 2017-10-16 ENCOUNTER — Telehealth: Payer: Self-pay | Admitting: Internal Medicine

## 2017-10-16 NOTE — Telephone Encounter (Signed)
Good call I think the other question is going to be [name] see her on Thursday

## 2017-10-16 NOTE — Telephone Encounter (Signed)
I left a message for the patient to call and see if she can come in to see [name] on 10/19/17 at 8:30 am with [name]. I asked her to call back to confirm.

## 2017-10-16 NOTE — Telephone Encounter (Signed)
Patient presented to the lobby.  She's been experiencing a sensation, dull not sharp, originating from her pacemaker site down through her armpit to her ring and pinky fingers on the left side.  It is a tingling feeling and leaves her two fingers numb.  States it happens a few times a day and lasts around 2-3 minutes.  Her site is well healed, no redness, swelling or warmth. S/w Shakila in device clinic who advised that appointment with clinic does not seem appropriate as site looks fine. S/w Nira Conn, Dr Olin Pia nurse, and ok to add on patient to Dr Caryl Comes schedule on a Thursday if needed.  Scheduling to contact patient.

## 2017-10-16 NOTE — Telephone Encounter (Signed)
To [name] to review as well.

## 2017-10-16 NOTE — Telephone Encounter (Signed)
Left message for patient to return call Per Anderson Malta C. patient can be rescheduled for sooner appt Placed hold for 2/21 (received an ok even though it is on a Thursday) and 2/26  Please schedule when patient returns call

## 2017-10-16 NOTE — Telephone Encounter (Signed)
Patient in lobby Is having uncomfortable pain that lingers in arm from pacemaker  Where incision was made it is painful to the touch  Feeling discomfort Would like to know if she can be seen sooner  Please discuss

## 2017-10-17 NOTE — Telephone Encounter (Signed)
appt scheduled 10/19/17 with [name].

## 2017-10-19 ENCOUNTER — Ambulatory Visit (INDEPENDENT_AMBULATORY_CARE_PROVIDER_SITE_OTHER): Payer: Medicare Other | Admitting: Internal Medicine

## 2017-10-19 ENCOUNTER — Encounter: Payer: Self-pay | Admitting: Internal Medicine

## 2017-10-19 VITALS — BP 104/64 | HR 84 | Ht 64.0 in | Wt 242.5 lb

## 2017-10-19 DIAGNOSIS — I209 Angina pectoris, unspecified: Secondary | ICD-10-CM | POA: Diagnosis not present

## 2017-10-19 DIAGNOSIS — Z950 Presence of cardiac pacemaker: Secondary | ICD-10-CM | POA: Diagnosis not present

## 2017-10-19 DIAGNOSIS — I495 Sick sinus syndrome: Secondary | ICD-10-CM

## 2017-10-19 DIAGNOSIS — R079 Chest pain, unspecified: Secondary | ICD-10-CM | POA: Diagnosis not present

## 2017-10-19 MED ORDER — METOPROLOL TARTRATE 50 MG PO TABS: 50.0000 mg | ORAL_TABLET | Freq: Once | ORAL | 0 refills | Status: DC

## 2017-10-19 NOTE — Patient Instructions (Addendum)
Medication Instructions:  Your physician recommends that you continue on your current medications as directed. Please refer to the Current Medication list given to you today.  Labwork: None ordered.  Testing/Procedures: Your physician has requested that you have a lexiscan myoview.   [place]  Your caregiver has ordered a Stress Test with nuclear imaging. The purpose of this test is to evaluate the blood supply to your heart muscle. This procedure is referred to as a "Non-Invasive Stress Test." This is because other than having an IV started in your vein, nothing is inserted or "invades" your body. Cardiac stress tests are done to find areas of poor blood flow to the heart by determining the extent of coronary artery disease (CAD). Some patients exercise on a treadmill, which naturally increases the blood flow to your heart, while others who are  unable to walk on a treadmill due to physical limitations have a pharmacologic/chemical stress agent called Lexiscan . This medicine will mimic walking on a treadmill by temporarily increasing your coronary blood flow.   Please note: these test may take anywhere between 2-4 hours to complete  PLEASE REPORT TO [facility] AT THE FIRST DESK WILL DIRECT YOU WHERE TO GO  Date of Procedure:________Friday 2/8/19__________________  Arrival Time for Procedure:________9:15 am______________________  Instructions regarding medication:   _x___ : Hold diabetes medication morning of procedure (Insulin)  _x___:  Hold betablocker(s) night before procedure and morning of procedure (Metoprolol)   PLEASE NOTIFY THE OFFICE AT LEAST 24 HOURS IN ADVANCE IF YOU ARE UNABLE TO KEEP YOUR APPOINTMENT.  [phone] AND  PLEASE NOTIFY NUCLEAR MEDICINE AT [facility] AT LEAST 24 HOURS IN ADVANCE IF YOU ARE UNABLE TO KEEP YOUR APPOINTMENT. [phone]  How to prepare for your Myoview test:  1. Do not eat or drink after midnight 2. No  caffeine for 24 hours prior to test 3. No smoking 24 hours prior to test. 4. Your medication may be taken with water.  If your doctor stopped a medication because of this test, do not take that medication. 5. Ladies, please do not wear dresses.  Skirts or pants are appropriate. Please wear a short sleeve shirt. 6. No perfume, cologne or lotion. 7. Wear comfortable walking shoes. No heels!   Follow-Up: Your physician recommends that you schedule a follow-up on 3/19 at 9:30am with Dr Caryl Comes.  Any Other Special Instructions Will Be Listed Below (If Applicable).     If you need a refill on your cardiac medications before your next appointment, please call your pharmacy.

## 2017-10-19 NOTE — Progress Notes (Signed)
Patient Care Team: [name], MD as PCP - General (Internal Medicine) [name], MD (Internal Medicine) Bary Castilla Forest Gleason, MD (General Surgery)   HPI  Deborah Obrien is a 77 y.o. female Seen for sinus node dysfunction exercise intolerance and previously implanted single chamber pacemaker for which she underwent dual chamber upgrade 12/18 cx by encircling of the chronic RV lead by the atrial lead resulting in tension for which she underwent RA lead revision   She has hx of atrial arrhythmias, with atrial fib and flutter, typical/atypical all documented   11/16 echocardiogram 55-60% 12/17 echocardiogram-stress EF 45% without changes   12/17 catheterization nonobstructive coronary disease 2+ MR   Date Cr Hgb  10/18 1.72 13.5  12/18 1.32 13.5   She is seen today because of pain in her left chest radiating into her left hand fourth and fifth digits.  She has a known history of cervical arthritis.  A recent CT scan showed more arthritis on the right than the left.  Pain lasts 5-10 seconds unrelated to exertion.  No bleeding on apixoban   Functional capacity is vastly improved since up grade of the device    Past Medical History:  Diagnosis Date  . A-fib ([place]) 2016  . Acid reflux   . Anxiety   . Bowel obstruction ([place])   . CHF (congestive heart failure) ([place]) 2017  . Chronic kidney disease (CKD) stage G3a/A1, moderately decreased glomerular filtration rate (GFR) between 45-59 mL/min/1.73 square meter and albuminuria creatinine ratio less than 30 mg/g (HCC)   . Diabetes mellitus without complication ([name])   . Dyspnea   . Fatty liver   . GI bleed   . Gout   . Hypertension   . Morbid obesity ([place])   . Osteoarthritis   . Osteoporosis   . Paroxysmal atrial fibrillation (HCC)   . Presence of permanent cardiac pacemaker   . Sinus node dysfunction ([place]) 08/24/2017  . TIA (transient ischemic attack)     Past Surgical History:  Procedure  Laterality Date  . ABDOMINAL HYSTERECTOMY  1980  . APPENDECTOMY    . BACK SURGERY     2008  . BREAST BIOPSY Right 08/16   stereo fibroadenomatous change, neg for atypia  . BREAST EXCISIONAL BIOPSY Left    neg  . CARDIAC CATHETERIZATION Left 08/25/2016   Procedure: Left Heart Cath and Coronary Angiography;  Surgeon: Corey Skains, MD;  Location: [facility];  Service: Cardiovascular;  Laterality: Left;  . CARDIOVERSION N/A 06/28/2017   Procedure: CARDIOVERSION;  Surgeon: Corey Skains, MD;  Location: [facility];  Service: Cardiovascular;  Laterality: N/A;  . CATARACT EXTRACTION, BILATERAL  2012  . CHOLECYSTECTOMY    . colon blockage  1999  . COLON SURGERY  1999  . COLONOSCOPY  2016   polyps removed 2016  . DORSAL COMPARTMENT RELEASE Left 09/14/2016   Procedure: RELEASE DORSAL COMPARTMENT (DEQUERVAIN);  Surgeon: Dereck Leep, MD;  Location: [facility];  Service: Orthopedics;  Laterality: Left;  . ESOPHAGOGASTRODUODENOSCOPY N/A 01/27/2015   Procedure: ESOPHAGOGASTRODUODENOSCOPY (EGD);  Surgeon: Lollie Sails, MD;  Location: [facility];  Service: Endoscopy;  Laterality: N/A;  . ESOPHAGOGASTRODUODENOSCOPY (EGD) WITH PROPOFOL N/A 07/24/2015   Procedure: ESOPHAGOGASTRODUODENOSCOPY (EGD) WITH PROPOFOL;  Surgeon: Lucilla Lame, MD;  Location: [facility];  Service: Endoscopy;  Laterality: N/A;  . JOINT REPLACEMENT  2014   Bilateral Knee replacement  . LEAD REVISION/REPAIR N/A 08/29/2017   Procedure: LEAD REVISION/REPAIR;  Surgeon:  Constance Haw, MD;  Location: [facility];  Service: Cardiovascular;  Laterality: N/A;  . PACEMAKER INSERTION Left 07/01/2015   Procedure: INSERTION PACEMAKER;  Surgeon: Isaias Cowman, MD;  Location: [facility];  Service: Cardiovascular;  Laterality: Left;  . PACEMAKER REVISION N/A 08/28/2017   Procedure: PACEMAKER REVISION;  Surgeon: Deboraha Sprang, MD;  Location: [facility];  Service: Cardiovascular;  Laterality: N/A;    . REPLACEMENT TOTAL KNEE BILATERAL    . SHOULDER ARTHROSCOPY WITH SUBACROMIAL DECOMPRESSION Left 2013  . TEE WITHOUT CARDIOVERSION N/A 06/28/2017   Procedure: TRANSESOPHAGEAL ECHOCARDIOGRAM (TEE);  Surgeon: Corey Skains, MD;  Location: [facility];  Service: Cardiovascular;  Laterality: N/A;    Current Outpatient Medications  Medication Sig Dispense Refill  . acetaminophen (TYLENOL) 500 MG tablet Take 1,000 mg by mouth every 6 (six) hours as needed for mild pain or moderate pain.     Marland Kitchen allopurinol (ZYLOPRIM) 100 MG tablet Take 100 mg by mouth daily.     Marland Kitchen apixaban (ELIQUIS) 5 MG TABS tablet Take 1 tablet (5 mg total) by mouth 2 (two) times daily. 60 tablet 2  . busPIRone (BUSPAR) 10 MG tablet Take 10 mg by mouth 2 (two) times daily.    . cholecalciferol (VITAMIN D) 1000 units tablet Take 1,000 Units by mouth daily.    . colchicine 0.6 MG tablet Take 0.6 mg by mouth daily as needed (gout).     Marland Kitchen docusate sodium (COLACE) 100 MG capsule Take 100 mg by mouth at bedtime.     Marland Kitchen HYDROcodone-acetaminophen (NORCO/VICODIN) 5-325 MG tablet Take 1 tablet by mouth every 6 (six) hours as needed for moderate pain. 8 tablet 0  . hydrocortisone 2.5 % cream Apply 1 application topically daily as needed (rash).    . insulin NPH-regular Human (NOVOLIN 70/30) (70-30) 100 UNIT/ML injection Inject 22 Units into the skin 2 (two) times daily before a meal.     . ketoconazole (NIZORAL) 2 % cream Apply 1 application topically every 2 (two) hours as needed (for rash under breasts).     Marland Kitchen oxybutynin (DITROPAN) 5 MG tablet Take 0.5 tablets (2.5 mg total) by mouth at bedtime. (Patient taking differently: Take 10 mg by mouth at bedtime. ) 30 tablet 0  . pantoprazole (PROTONIX) 40 MG tablet Take 40 mg by mouth 2 (two) times daily.    Marland Kitchen PARoxetine (PAXIL) 20 MG tablet Take 20 mg by mouth daily.    Vladimir Faster Glycol-Propyl Glycol (SYSTANE OP) Apply 1 drop to eye daily as needed (dry eyes).    . pravastatin (PRAVACHOL) 20  MG tablet Take 20 mg by mouth at bedtime.     . talc (ZEASORB) powder Apply 1 application topically as needed (under the breast irritation).    . traZODone (DESYREL) 50 MG tablet Take 1 tablet (50 mg total) by mouth at bedtime. 30 tablet 0   No current facility-administered medications for this visit.     Allergies  Allergen Reactions  . Cephalosporins Other (See Comments)    Reaction:  Unknown   . Macrolides And Ketolides Other (See Comments)    Reaction:  Unknown   . Meperidine Shortness Of Breath, Nausea Only and Other (See Comments)    Reaction:  Stomach pain   . Prednisone Anaphylaxis and Other (See Comments)    Reaction:  Increases pts blood sugar  Pt states that she is allergic to all steroids.    . Shellfish Allergy Anaphylaxis, Shortness Of Breath, Diarrhea, Nausea Only  and Rash    Reaction:  Stomach pain   . Sulfa Antibiotics Shortness Of Breath, Diarrhea, Nausea Only and Other (See Comments)    Reaction:  Stomach pain   . Uloric [Febuxostat] Anaphylaxis    Locks pt's body up   . Aspirin Other (See Comments)    Reaction:  Stomach pain   . Celecoxib Other (See Comments)    Reaction:  GI bleed, weakness, and stomach pain.    . Cephalexin Diarrhea, Nausea Only and Other (See Comments)    Reaction:  Stomach pain   . Erythromycin Diarrhea, Nausea Only and Other (See Comments)    Reaction:  Stomach pain   . Hydromorphone Other (See Comments)    Reaction:  Hypotension   . Iodinated Diagnostic Agents Rash and Other (See Comments)    Pt states that she is unable to have because she has chronic kidney disease.    Marland Kitchen Oxycodone Other (See Comments)    Reaction:  Stomach pain   . Atorvastatin Nausea Only and Other (See Comments)    Reaction:  Weakness   . Codeine Diarrhea, Nausea Only and Other (See Comments)    Reaction:  Stomach pain Pt tolerates morphine   . Doxycycline     Stomach pain   . Tape Other (See Comments)    Reaction:  Causes pts skin to tear  Pt states that  she is able to use paper tape.    . Valdecoxib Swelling and Other (See Comments)    Pt states that her hands and feet swell.    . Iodine Rash      Review of Systems negative except from HPI and PMH  Physical Exam BP 104/64 (BP Location: Left Arm, Patient Position: Sitting, Cuff Size: Large)   Pulse 84   Ht 5\' 4"  (1.626 m)   Wt 242 lb 8 oz (110 kg)   BMI 41.63 kg/m  Well developed and nourished in no acute distress HENT normal Neck supple with JVP-flat Clear Device pocket well healed; without hematoma or erythema.  There is no tethering  Regular rate and rhythm, no murmurs or gallops Abd-soft with active BS No Clubbing cyanosis edema No UE swelling  Skin-warm and dry A & Oriented  Grossly normal sensory and motor function      Assessment and  Plan  Sinus node dysfunction  Paroxysmal atrial fibrillation/flutter with a rapid ventricular response  Cardiomyopathy-modest  Thromboembolic risk profile as noted above  GI bleeding related to polyps  Obstructive sleep apnea with sleep disordered breathing and daytime somnolence   Medtronic upgraded to dual chamber    Chest pain    I suspect her pain is neuro radicular in origin.  Brevity and its involvement of the distal digits unlikely to be cardiac.  However, atypical things being atypical, we will get CTA to exclude coronary disease with FFR if necessary but has had recent eval which makes pretest likelihood lower  wiiht issues of renal function, the pt has opted for myoview scanning--we have an old study with which to compare   In addition, I have asked her to take an NSAID for 3 days Advil 400 twice daily  Current medicines are reviewed at length with the patient today .  The patient does have concerns re GI tract regarding medicines NSAIDs but will try it for a couple of days.  We spent more than 50% of our >25 min visit in face to face counseling regarding the above

## 2017-10-20 ENCOUNTER — Encounter
Admission: RE | Admit: 2017-10-20 | Discharge: 2017-10-20 | Disposition: A | Discharge status: Home / Self Care | Payer: Medicare Other | Source: Ambulatory Visit | Attending: Internal Medicine | Admitting: Internal Medicine

## 2017-10-20 DIAGNOSIS — R079 Chest pain, unspecified: Secondary | ICD-10-CM

## 2017-10-20 LAB — NM MYOCAR MULTI W/SPECT W/WALL MOTION / EF
LV dias vol: 88 mL (ref 46–106)
LV sys vol: 27 mL
NUC STRESS TID: 0.93
Peak HR: 71 {beats}/min
Percent HR: 49 %
Rest HR: 71 {beats}/min
SDS: 1
SRS: 7
SSS: 3

## 2017-10-20 MED ORDER — REGADENOSON 0.4 MG/5ML IV SOLN
0.4000 mg | Freq: Once | INTRAVENOUS | Status: AC
  Administered 2017-10-20: 0.4 mg via INTRAVENOUS

## 2017-10-20 MED ORDER — TECHNETIUM TC 99M TETROFOSMIN IV KIT
33.0000 | PACK | Freq: Once | INTRAVENOUS | Status: AC | PRN
  Administered 2017-10-20: 33 via INTRAVENOUS

## 2017-10-20 MED ORDER — TECHNETIUM TC 99M TETROFOSMIN IV KIT
13.8000 | PACK | Freq: Once | INTRAVENOUS | Status: AC | PRN
  Administered 2017-10-20: 13.8 via INTRAVENOUS

## 2017-10-25 ENCOUNTER — Telehealth: Payer: Self-pay | Admitting: Internal Medicine

## 2017-10-25 MED ORDER — APIXABAN 5 MG PO TABS: 5.0000 mg | ORAL_TABLET | Freq: Two times a day (BID) | ORAL | 2 refills | Status: DC

## 2017-10-25 NOTE — Telephone Encounter (Signed)
Refill Request.  

## 2017-10-25 NOTE — Telephone Encounter (Signed)
Refill sent to [facility]  

## 2017-10-25 NOTE — Telephone Encounter (Signed)
New message  Patient spouse request call when complete. Please call   *STAT* If patient is at the pharmacy, call can be transferred to refill team.   1. Which medications need to be refilled? (please list name of each medication and dose if known) apixaban (ELIQUIS) 5 MG TABS tablet  2. Which pharmacy/location (including street and city if local pharmacy) is medication to be sent to? [place], [place]  3. Do they need a 30 day or 90 day supply? [place]

## 2017-11-16 ENCOUNTER — Telehealth: Payer: Self-pay | Admitting: Cardiology

## 2017-11-16 NOTE — Telephone Encounter (Signed)
Called pt b/c we received a request to release her in carelink. Pt stated that she didn't want her information released to [facility] in [place]. Carelink request was rejected.

## 2017-11-28 ENCOUNTER — Encounter: Payer: Self-pay | Admitting: Internal Medicine

## 2017-11-28 ENCOUNTER — Ambulatory Visit (INDEPENDENT_AMBULATORY_CARE_PROVIDER_SITE_OTHER): Payer: Medicare Other | Admitting: Internal Medicine

## 2017-11-28 ENCOUNTER — Encounter: Payer: Medicare Other | Admitting: Internal Medicine

## 2017-11-28 VITALS — BP 100/64 | HR 81 | Ht 64.5 in | Wt 248.5 lb

## 2017-11-28 DIAGNOSIS — I480 Paroxysmal atrial fibrillation: Secondary | ICD-10-CM | POA: Diagnosis not present

## 2017-11-28 DIAGNOSIS — I495 Sick sinus syndrome: Secondary | ICD-10-CM | POA: Diagnosis not present

## 2017-11-28 DIAGNOSIS — Z950 Presence of cardiac pacemaker: Secondary | ICD-10-CM | POA: Diagnosis not present

## 2017-11-28 DIAGNOSIS — I209 Angina pectoris, unspecified: Secondary | ICD-10-CM

## 2017-11-28 NOTE — Patient Instructions (Addendum)
Medication Instructions: - Your physician has recommended you make the following change in your medication:  1) DECREASE metoprolol tartrate 25 mg- take 1 tablet (25 mg) by mouth TWICE daily  Labwork: - none ordered  Procedures/Testing: - none ordered  Follow-Up: - Remote monitoring is used to monitor your Pacemaker of ICD from home. This monitoring reduces the number of office visits required to check your device to one time per year. It allows [place] to keep an eye on the functioning of your device to ensure it is working properly. You are scheduled for a device check from home on 02/27/18. You may send your transmission at any time that day. If you have a wireless device, the transmission will be sent automatically. After your physician reviews your transmission, you will receive a postcard with your next transmission date.  - Your physician wants you to follow-up in: 9 months with [name]. You will receive a reminder letter in the mail two months in advance. If you don't receive a letter, please call our office to schedule the follow-up appointment.   Any Additional Special Instructions Will Be Listed Below (If Applicable).     If you need a refill on your cardiac medications before your next appointment, please call your pharmacy.

## 2017-11-28 NOTE — Progress Notes (Signed)
Patient Care Team: [name], MD as PCP - General (Internal Medicine) [name], MD (Internal Medicine) Bary Castilla Forest Gleason, MD (General Surgery)   HPI  Deborah Obrien is a 77 y.o. female Seen for sinus node dysfunction exercise intolerance and previously implanted single chamber pacemaker for which she underwent dual chamber upgrade 12/18 cx by encircling of the chronic RV lead by the atrial lead resulting in tension for which she underwent RA lead revision   She has hx of atrial arrhythmias, with atrial fib and flutter, typical/atypical all documented    11/16  Echo  EF 55-60   12/17 Echo  EF 45% Stress neg  12/17 Cath  Nonobstructive CAD  Mr 2+  2/19 Myoview EF 55-65% No ischemia     Date Cr Hgb  10/18 1.72 13.5  12/18 1.32 13.5   Seen last month with Chest pain, myoview as above  No sob or edema   Feels so much better following upgrade     no bleeding   Left leg swollen but dates to knee replacement surgery  Past Medical History:  Diagnosis Date  . A-fib ([place]) 2016  . Acid reflux   . Anxiety   . Bowel obstruction ([place])   . CHF (congestive heart failure) ([place]) 2017  . Chronic kidney disease (CKD) stage G3a/A1, moderately decreased glomerular filtration rate (GFR) between 45-59 mL/min/1.73 square meter and albuminuria creatinine ratio less than 30 mg/g (HCC)   . Diabetes mellitus without complication ([place])   . Dyspnea   . Fatty liver   . GI bleed   . Gout   . Hypertension   . Morbid obesity ([place])   . Osteoarthritis   . Osteoporosis   . Paroxysmal atrial fibrillation (HCC)   . Presence of permanent cardiac pacemaker   . Sinus node dysfunction ([place]) 08/24/2017  . TIA (transient ischemic attack)     Past Surgical History:  Procedure Laterality Date  . ABDOMINAL HYSTERECTOMY  1980  . APPENDECTOMY    . BACK SURGERY     2008  . BREAST BIOPSY Right 08/16   stereo fibroadenomatous change, neg for atypia  . BREAST EXCISIONAL  BIOPSY Left    neg  . CARDIAC CATHETERIZATION Left 08/25/2016   Procedure: Left Heart Cath and Coronary Angiography;  Surgeon: Corey Skains, MD;  Location: [facility];  Service: Cardiovascular;  Laterality: Left;  . CARDIOVERSION N/A 06/28/2017   Procedure: CARDIOVERSION;  Surgeon: Corey Skains, MD;  Location: [facility];  Service: Cardiovascular;  Laterality: N/A;  . CATARACT EXTRACTION, BILATERAL  2012  . CHOLECYSTECTOMY    . colon blockage  1999  . COLON SURGERY  1999  . COLONOSCOPY  2016   polyps removed 2016  . DORSAL COMPARTMENT RELEASE Left 09/14/2016   Procedure: RELEASE DORSAL COMPARTMENT (DEQUERVAIN);  Surgeon: Dereck Leep, MD;  Location: [facility];  Service: Orthopedics;  Laterality: Left;  . ESOPHAGOGASTRODUODENOSCOPY N/A 01/27/2015   Procedure: ESOPHAGOGASTRODUODENOSCOPY (EGD);  Surgeon: Lollie Sails, MD;  Location: [facility];  Service: Endoscopy;  Laterality: N/A;  . ESOPHAGOGASTRODUODENOSCOPY (EGD) WITH PROPOFOL N/A 07/24/2015   Procedure: ESOPHAGOGASTRODUODENOSCOPY (EGD) WITH PROPOFOL;  Surgeon: Lucilla Lame, MD;  Location: [facility];  Service: Endoscopy;  Laterality: N/A;  . JOINT REPLACEMENT  2014   Bilateral Knee replacement  . LEAD REVISION/REPAIR N/A 08/29/2017   Procedure: LEAD REVISION/REPAIR;  Surgeon: Constance Haw, MD;  Location: [facility];  Service: Cardiovascular;  Laterality: N/A;  .  PACEMAKER INSERTION Left 07/01/2015   Procedure: INSERTION PACEMAKER;  Surgeon: Isaias Cowman, MD;  Location: [facility];  Service: Cardiovascular;  Laterality: Left;  . PACEMAKER REVISION N/A 08/28/2017   Procedure: PACEMAKER REVISION;  Surgeon: Deboraha Sprang, MD;  Location: [facility];  Service: Cardiovascular;  Laterality: N/A;  . REPLACEMENT TOTAL KNEE BILATERAL    . SHOULDER ARTHROSCOPY WITH SUBACROMIAL DECOMPRESSION Left 2013  . TEE WITHOUT CARDIOVERSION N/A 06/28/2017   Procedure: TRANSESOPHAGEAL ECHOCARDIOGRAM  (TEE);  Surgeon: Corey Skains, MD;  Location: [facility];  Service: Cardiovascular;  Laterality: N/A;    Current Outpatient Medications  Medication Sig Dispense Refill  . acetaminophen (TYLENOL) 500 MG tablet Take 1,000 mg by mouth every 6 (six) hours as needed for mild pain or moderate pain.     Marland Kitchen allopurinol (ZYLOPRIM) 100 MG tablet Take 150 mg by mouth daily.     Marland Kitchen apixaban (ELIQUIS) 5 MG TABS tablet Take 1 tablet (5 mg total) by mouth 2 (two) times daily. 60 tablet 2  . busPIRone (BUSPAR) 10 MG tablet Take 10 mg by mouth 2 (two) times daily.    . cholecalciferol (VITAMIN D) 1000 units tablet Take 1,000 Units by mouth daily.    . colchicine 0.6 MG tablet Take 0.6 mg by mouth daily as needed (gout).     Marland Kitchen docusate sodium (COLACE) 100 MG capsule Take 100 mg by mouth at bedtime.     Marland Kitchen HYDROcodone-acetaminophen (NORCO/VICODIN) 5-325 MG tablet Take 1 tablet by mouth every 6 (six) hours as needed for moderate pain. 8 tablet 0  . hydrocortisone 2.5 % cream Apply 1 application topically daily as needed (rash).    . insulin NPH-regular Human (NOVOLIN 70/30) (70-30) 100 UNIT/ML injection Inject 22 Units into the skin 2 (two) times daily before a meal.     . ketoconazole (NIZORAL) 2 % cream Apply 1 application topically every 2 (two) hours as needed (for rash under breasts).     . metoprolol tartrate (LOPRESSOR) 25 MG tablet Take 25 mg by mouth 3 (three) times daily.    Marland Kitchen oxybutynin (DITROPAN) 5 MG tablet Take 0.5 tablets (2.5 mg total) by mouth at bedtime. (Patient taking differently: Take 10 mg by mouth at bedtime. ) 30 tablet 0  . pantoprazole (PROTONIX) 40 MG tablet Take 40 mg by mouth daily.     Marland Kitchen PARoxetine (PAXIL) 20 MG tablet Take 20 mg by mouth daily.    Vladimir Faster Glycol-Propyl Glycol (SYSTANE OP) Apply 1 drop to eye daily as needed (dry eyes).    . pravastatin (PRAVACHOL) 20 MG tablet Take 20 mg by mouth at bedtime.     . talc (ZEASORB) powder Apply 1 application topically as needed  (under the breast irritation).    . traZODone (DESYREL) 50 MG tablet Take 1 tablet (50 mg total) by mouth at bedtime. 30 tablet 0   No current facility-administered medications for this visit.     Allergies  Allergen Reactions  . Cephalosporins Other (See Comments)    Reaction:  Unknown   . Macrolides And Ketolides Other (See Comments)    Reaction:  Unknown   . Meperidine Shortness Of Breath, Nausea Only and Other (See Comments)    Reaction:  Stomach pain   . Prednisone Anaphylaxis and Other (See Comments)    Reaction:  Increases pts blood sugar  Pt states that she is allergic to all steroids.    . Shellfish Allergy Anaphylaxis, Shortness Of Breath, Diarrhea, Nausea Only and Rash  Reaction:  Stomach pain   . Sulfa Antibiotics Shortness Of Breath, Diarrhea, Nausea Only and Other (See Comments)    Reaction:  Stomach pain   . Uloric [Febuxostat] Anaphylaxis    Locks pt's body up   . Aspirin Other (See Comments)    Reaction:  Stomach pain   . Celecoxib Other (See Comments)    Reaction:  GI bleed, weakness, and stomach pain.    . Cephalexin Diarrhea, Nausea Only and Other (See Comments)    Reaction:  Stomach pain   . Erythromycin Diarrhea, Nausea Only and Other (See Comments)    Reaction:  Stomach pain   . Hydromorphone Other (See Comments)    Reaction:  Hypotension   . Iodinated Diagnostic Agents Rash and Other (See Comments)    Pt states that she is unable to have because she has chronic kidney disease.    Marland Kitchen Oxycodone Other (See Comments)    Reaction:  Stomach pain   . Atorvastatin Nausea Only and Other (See Comments)    Reaction:  Weakness   . Codeine Diarrhea, Nausea Only and Other (See Comments)    Reaction:  Stomach pain Pt tolerates morphine   . Doxycycline     Stomach pain   . Tape Other (See Comments)    Reaction:  Causes pts skin to tear  Pt states that she is able to use paper tape.    . Valdecoxib Swelling and Other (See Comments)    Pt states that her hands  and feet swell.    . Iodine Rash      Review of Systems negative except from HPI and PMH  Physical Exam BP 100/64 (BP Location: Left Arm, Patient Position: Sitting, Cuff Size: Large)   Pulse 81   Ht 5' 4.5" (1.638 m)   Wt 248 lb 8 oz (112.7 kg)   BMI 42.00 kg/m  Well developed and nourished in no acute distress HENT normal Neck supple with JVP-flat Clear Device pocket well healed; without hematoma or erythema.  There is no tethering  Regular rate and rhythm, no murmurs or gallops Abd-soft with active BS No Clubbing cyanosis L leg 1+edema Skin-warm and dry A & Oriented  Grossly normal sensory and motor function   E#CG A pacing    Assessment and  Plan  Sinus node dysfunction  Paroxysmal atrial fibrillation/flutter with a rapid ventricular response  Cardiomyopathy-interval normalization   Diabetes mellitus with nephropathy  Thromboembolic risk profile as noted above   Medtronic pacemaker upgraded to dual chamber The patient's device was interrogated and the information was fully reviewed.  The device was reprogrammed to maximize longevity      On Anticoagulation;  No bleeding issues   BP well controlled, indeed in the context of her DM might be a little low  Will decrease her metop 25 tid>>bid  Leg swelling 2/2 knee surgery    Heart rate excursion good

## 2017-12-02 ENCOUNTER — Telehealth: Payer: Self-pay | Admitting: Cardiology

## 2017-12-02 NOTE — Telephone Encounter (Signed)
Pt called concerned about low B/P- systolic in the 59'R with mild orthostatic symptoms. She has had this in the past. Dr Caryl Comes recently took her off Amiodarone and decreased her Metoprolol to 25 mg BID from 25 mg TID. The pt's HR is 60. I suggested she push fluids (last EF was 55%), hold this am's dose of metoprolol, and decrease her daily dose to 12.5 mg BID.  Kerin Ransom PA-C 12/02/2017 7:50 AM

## 2017-12-03 ENCOUNTER — Telehealth: Payer: Self-pay | Admitting: Medical

## 2017-12-03 NOTE — Telephone Encounter (Signed)
Patient called reporting continued low blood pressure - 87/67 this am; HR in the 60s. Also continued orthostatic symptoms, feeling pre-syncopal with standing. Patient followed instructions from [facility] yesterday - did not take metoprolol last night and cut morning dose in half, taking 12.5mg  this AM. She notes drinking 2 16oz bottles of water and 2 caffeine free sodas yesterday. Instructed patient to increase fluids to at least 64oz today, use caution when changing positions and allow a few minutes to acclimate, wear compression stockings and an abdominal binder, stop metoprolol until evaluated in the clinic, and to call the office on Monday to schedule an appointment. Patient instructed to present to the ED if her symptoms persist or if she has a syncopal episode.

## 2017-12-04 ENCOUNTER — Telehealth: Payer: Self-pay | Admitting: Internal Medicine

## 2017-12-04 NOTE — Telephone Encounter (Signed)
New message    Pt c/o medication issue:  1. Name of Medication: metoprolol tartrate (LOPRESSOR) 25 MG tablet  2. How are you currently taking this medication (dosage and times per day)? n/a  3. Are you having a reaction (difficulty breathing--STAT)? No  4. What is your medication issue? Patient wants to verify if she needs to stay off medication. Patient spoke with on-call APP this weekend.  Her BP today 131/57 HR 86

## 2017-12-04 NOTE — Telephone Encounter (Signed)
Per Dr Caryl Comes, pt should continue to hold lopressor. I advised her to continue to monitor her BP and if it starts to trend up, call the office for recommendations. Pt stated she was feeling much better today and her SBP at lunch was in the 102 range. Pt verbalized understanding and will call with any additional concerns.

## 2017-12-09 ENCOUNTER — Telehealth: Payer: Self-pay | Admitting: Cardiology

## 2017-12-09 NOTE — Telephone Encounter (Signed)
Deborah Obrien called the answering service with concerns for elevated heart rate and feeling woozy/tired.  I reviewed her chart and called her back.  She has a history of atrial fibrillation, taken off amiodarone in December.  She has been controlled with beta-blocker however she recently had hypotension and her metoprolol was discontinued.  This morning prior to getting out of bed she felt woozy with vague chest discomfort.  She checked her pulse which was 126 bpm with a blood pressure 114/65.  Since then she has been monitoring her pulse and it has been in the 100s-110s.  She is on blood thinner.  We discussed options including coming to the hospital or taking 1 dose of metoprolol 25 mg and see how she feels.  She says that she will try the metoprolol and if her heart rate does not come down and she does not feel better she will come to the hospital.  I have instructed her to drink water if her blood pressure gets low.  She understands and feels comfortable with the plan.

## 2017-12-11 ENCOUNTER — Telehealth: Payer: Self-pay | Admitting: Internal Medicine

## 2017-12-11 NOTE — Telephone Encounter (Signed)
Pt c/o medication issue:  1. Name of Medication: Metoprolol Tartrate   2. How are you currently taking this medication (dosage and times per day)? 25 mg// twice a day   3. Are you having a reaction (difficulty breathing--STAT)? No  4. What is your medication issue? Patient had an AFIB episode Saturday and was instructed by On-Call provider to start the metoprolol, 25 mg twice a day. Patient states that she discontinued the metoprolol because her BP was dropping low. Patient would like to verify that she should continue taking the metoprolol.

## 2017-12-11 NOTE — Telephone Encounter (Signed)
I called and spoke with the patient.  She reports that she did have an episode of a-fib on Saturday where her HR's were in the 100's. She took 1 dose of metoprolol tartrate 25 mg on Saturday. She took metoprolol tartrate 25 mg BID on Sunday (yesterday). Today she called asking what she should do about the metoprolol.  This morning her BP/ HR is 115/51 (72). She is feeling some fatigue this morning. I advised her that she can take metoprolol tartrate 25 mg- 1/2 tablet (12.5 mg) BID and continue to monitor her BP/ HR. I have also advised her if her BP continues to run a little on the low side and she is not feeling well, then she can stop metoprolol and see how she feels. She is aware at that point she could use metoprolol tartrate 25 mg PRN for breakthrough of her a-fib with elevated HR's. The patient is agreeable and verbalizes understanding.

## 2017-12-21 NOTE — Telephone Encounter (Signed)
noted 

## 2017-12-29 ENCOUNTER — Emergency Department: Payer: Medicare Other

## 2017-12-29 ENCOUNTER — Emergency Department
Admission: EM | Admit: 2017-12-29 | Discharge: 2017-12-29 | Disposition: A | Discharge status: Home / Self Care | Payer: Medicare Other | Attending: Emergency Medicine | Admitting: Emergency Medicine

## 2017-12-29 ENCOUNTER — Encounter: Payer: Self-pay | Admitting: Emergency Medicine

## 2017-12-29 ENCOUNTER — Other Ambulatory Visit: Payer: Self-pay

## 2017-12-29 DIAGNOSIS — Z794 Long term (current) use of insulin: Secondary | ICD-10-CM | POA: Diagnosis not present

## 2017-12-29 DIAGNOSIS — I509 Heart failure, unspecified: Secondary | ICD-10-CM | POA: Diagnosis not present

## 2017-12-29 DIAGNOSIS — I130 Hypertensive heart and chronic kidney disease with heart failure and stage 1 through stage 4 chronic kidney disease, or unspecified chronic kidney disease: Secondary | ICD-10-CM | POA: Insufficient documentation

## 2017-12-29 DIAGNOSIS — E119 Type 2 diabetes mellitus without complications: Secondary | ICD-10-CM | POA: Diagnosis not present

## 2017-12-29 DIAGNOSIS — K922 Gastrointestinal hemorrhage, unspecified: Secondary | ICD-10-CM

## 2017-12-29 DIAGNOSIS — Z96653 Presence of artificial knee joint, bilateral: Secondary | ICD-10-CM | POA: Insufficient documentation

## 2017-12-29 DIAGNOSIS — K920 Hematemesis: Secondary | ICD-10-CM | POA: Diagnosis present

## 2017-12-29 DIAGNOSIS — N183 Chronic kidney disease, stage 3 (moderate): Secondary | ICD-10-CM | POA: Insufficient documentation

## 2017-12-29 DIAGNOSIS — Z79899 Other long term (current) drug therapy: Secondary | ICD-10-CM | POA: Insufficient documentation

## 2017-12-29 LAB — CBC
HCT: 39.6 % (ref 35.0–47.0)
HEMOGLOBIN: 13.3 g/dL (ref 12.0–16.0)
MCH: 33.5 pg (ref 26.0–34.0)
MCHC: 33.7 g/dL (ref 32.0–36.0)
MCV: 99.6 fL (ref 80.0–100.0)
Platelets: 173 10*3/uL (ref 150–440)
RBC: 3.97 MIL/uL (ref 3.80–5.20)
RDW: 13.2 % (ref 11.5–14.5)
WBC: 4 10*3/uL (ref 3.6–11.0)

## 2017-12-29 LAB — COMPREHENSIVE METABOLIC PANEL
ALBUMIN: 4 g/dL (ref 3.5–5.0)
ALK PHOS: 120 U/L (ref 38–126)
ALT: 17 U/L (ref 14–54)
AST: 32 U/L (ref 15–41)
Anion gap: 7 (ref 5–15)
BILIRUBIN TOTAL: 1.2 mg/dL (ref 0.3–1.2)
BUN: 26 mg/dL — ABNORMAL HIGH (ref 6–20)
CALCIUM: 8.9 mg/dL (ref 8.9–10.3)
CO2: 26 mmol/L (ref 22–32)
Chloride: 105 mmol/L (ref 101–111)
Creatinine, Ser: 1.32 mg/dL — ABNORMAL HIGH (ref 0.44–1.00)
GFR calc Af Amer: 44 mL/min — ABNORMAL LOW (ref >60)
GFR calc non Af Amer: 38 mL/min — ABNORMAL LOW (ref >60)
GLUCOSE: 201 mg/dL — AB (ref 65–99)
Potassium: 5.2 mmol/L — ABNORMAL HIGH (ref 3.5–5.1)
SODIUM: 138 mmol/L (ref 135–145)
TOTAL PROTEIN: 7.3 g/dL (ref 6.5–8.1)

## 2017-12-29 LAB — PROTIME-INR
INR: 1.07
PROTHROMBIN TIME: 13.8 s (ref 11.4–15.2)

## 2017-12-29 LAB — LIPASE, BLOOD: Lipase: 26 U/L (ref 11–51)

## 2017-12-29 LAB — TYPE AND SCREEN
ABO/RH(D): A POS
ANTIBODY SCREEN: NEGATIVE

## 2017-12-29 LAB — APTT: aPTT: 28 seconds (ref 24–36)

## 2017-12-29 MED ORDER — PANTOPRAZOLE SODIUM 40 MG PO TBEC: 40.0000 mg | DELAYED_RELEASE_TABLET | Freq: Two times a day (BID) | ORAL | 0 refills | Status: DC

## 2017-12-29 MED ORDER — ONDANSETRON 4 MG PO TBDP: 4.0000 mg | ORAL_TABLET | Freq: Three times a day (TID) | ORAL | 0 refills | Status: DC | PRN

## 2017-12-29 NOTE — ED Notes (Signed)
Pt alert and oriented X4, active, cooperative, pt in NAD. RR even and unlabored, color WNL.  Pt informed to return if any life threatening symptoms occur.  Discharge and followup instructions reviewed.  

## 2017-12-29 NOTE — Discharge Instructions (Addendum)
Please increase your home protonix from 40mg  once a day to 40mg  twice a day as you used to take.  Follow-up with your primary care physician this coming Monday for reevaluation and return to the emergency department sooner for any new or worsening symptoms such as worsening pain, if you cannot eat or drink, or for any other issues whatsoever.  It was a pleasure to take care of you today, and thank you for coming to our emergency department.  If you have any questions or concerns before leaving please ask the nurse to grab me and I'm more than happy to go through your aftercare instructions again.  If you were prescribed any opioid pain medication today such as Norco, Vicodin, Percocet, morphine, hydrocodone, or oxycodone please make sure you do not drive when you are taking this medication as it can alter your ability to drive safely.  If you have any concerns once you are home that you are not improving or are in fact getting worse before you can make it to your follow-up appointment, please do not hesitate to call 911 and come back for further evaluation.  Darel Hong, MD  Results for orders placed or performed during the hospital encounter of 12/29/17  Comprehensive metabolic panel  Result Value Ref Range   Sodium 138 135 - 145 mmol/L   Potassium 5.2 (H) 3.5 - 5.1 mmol/L   Chloride 105 101 - 111 mmol/L   CO2 26 22 - 32 mmol/L   Glucose, Bld 201 (H) 65 - 99 mg/dL   BUN 26 (H) 6 - 20 mg/dL   Creatinine, Ser 1.32 (H) 0.44 - 1.00 mg/dL   Calcium 8.9 8.9 - 10.3 mg/dL   Total Protein 7.3 6.5 - 8.1 g/dL   Albumin 4.0 3.5 - 5.0 g/dL   AST 32 15 - 41 U/L   ALT 17 14 - 54 U/L   Alkaline Phosphatase 120 38 - 126 U/L   Total Bilirubin 1.2 0.3 - 1.2 mg/dL   GFR calc non Af Amer 38 (L) >60 mL/min   GFR calc Af Amer 44 (L) >60 mL/min   Anion gap 7 5 - 15  CBC  Result Value Ref Range   WBC 4.0 3.6 - 11.0 K/uL   RBC 3.97 3.80 - 5.20 MIL/uL   Hemoglobin 13.3 12.0 - 16.0 g/dL   HCT 39.6 35.0  - 47.0 %   MCV 99.6 80.0 - 100.0 fL   MCH 33.5 26.0 - 34.0 pg   MCHC 33.7 32.0 - 36.0 g/dL   RDW 13.2 11.5 - 14.5 %   Platelets 173 150 - 440 K/uL  Lipase, blood  Result Value Ref Range   Lipase 26 11 - 51 U/L  Protime-INR  Result Value Ref Range   Prothrombin Time 13.8 11.4 - 15.2 seconds   INR 1.07   APTT  Result Value Ref Range   aPTT 28 24 - 36 seconds  Type and screen [place]  Result Value Ref Range   ABO/RH(D) A POS    Antibody Screen NEG    Sample Expiration      01/01/2018 Performed at [facility], [address] [place], [address] [address]    Dg Chest 2 View  Result Date: 12/29/2017 CLINICAL DATA:  Pt having abdomen pain this morning and then started throwing up blood . Hx of pacemaker, hypertension, pneumonia, diabetes, afib, CHF, TIA. Non smoker EXAM: CHEST - 2 VIEW COMPARISON:  08/30/2017 FINDINGS: LEFT-sided transvenous pacemaker leads  to the RIGHT atrium and RIGHT ventricle. The heart is normal in size. There are no focal consolidations or pleural effusions. No pulmonary edema. Remote cervical fusion. Mild midthoracic spondylosis. IMPRESSION: No active cardiopulmonary disease. Electronically Signed   By: Nolon Nations M.D.   On: 12/29/2017 15:17

## 2017-12-29 NOTE — ED Provider Notes (Signed)
[facility] Emergency Department Provider Note  ____________________________________________   First MD Initiated Contact with Patient 12/29/17 1428     (approximate)  I have reviewed the triage vital signs and the nursing notes.   HISTORY  Chief Complaint GI Bleeding   HPI Deborah Obrien is a 77 y.o. female itself presents to the emergency department with 2 episodes of hematemesis that began this morning.  She vomited forcefully several times and it looked like "Kool-Aid" when it came up.  She takes Eliquis for chronic atrial fibrillation.  No aspirin, Plavix, or nonsteroidals.  This is never happened to her before.  She does have a remote endoscopy in 2016 showing multiple polyps.  She denies abdominal pain.  She denies diarrhea.  Her symptoms came on suddenly and it resolved quickly on their own.  Her pain was nonradiating at the time.  Past Medical History:  Diagnosis Date  . A-fib ([place]) 2016  . Acid reflux   . Anxiety   . Bowel obstruction ([place])   . CHF (congestive heart failure) ([place]) 2017  . Chronic kidney disease (CKD) stage G3a/A1, moderately decreased glomerular filtration rate (GFR) between 45-59 mL/min/1.73 square meter and albuminuria creatinine ratio less than 30 mg/g (HCC)   . Diabetes mellitus without complication ([place])   . Dyspnea   . Fatty liver   . GI bleed   . Gout   . Hypertension   . Morbid obesity ([place])   . Osteoarthritis   . Osteoporosis   . Paroxysmal atrial fibrillation (HCC)   . Presence of permanent cardiac pacemaker   . Sinus node dysfunction ([place]) 08/24/2017  . TIA (transient ischemic attack)     Patient Active Problem List   Diagnosis Date Noted  . Atrial flutter ([place]) 08/24/2017  . Sinus node dysfunction ([place]) 08/24/2017  . Dizziness 06/12/2017  . Mastalgia 02/14/2017  . Chronic kidney disease, stage 3, mod decreased GFR (HCC) 01/09/2017  . Fatty liver 01/09/2017  . Benign essential HTN 09/08/2016  .  Nephrolithiasis 02/22/2016  . Diabetic neuropathy with neurologic complication ([place]) 19/37/9024  . Moderate episode of recurrent major depressive disorder ([place]) 08/11/2015  . Gastritis without bleeding   . Gastric polyp   . Gastro-esophageal reflux disease without esophagitis   . Atrial fibrillation ([place]) 06/29/2015  . Intestinal obstruction ([place]) 02/20/2015  . DDD (degenerative disc disease), lumbar 01/19/2015  . Back pain 01/12/2015  . Gout 01/12/2015  . Complete tear of left rotator cuff 10/16/2014  . Intervertebral disc disorder with radiculopathy of lumbar region 09/17/2014  . Lumbar radiculitis 09/17/2014  . Lumbar stenosis with neurogenic claudication 09/17/2014  . Neuritis or radiculitis due to rupture of lumbar intervertebral disc 09/17/2014  . Atherosclerosis of both carotid arteries 07/15/2014  . Carotid atherosclerosis 07/15/2014  . Essential hypertension 07/15/2014  . Hyperlipemia, mixed 07/15/2014  . 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 Body mass index (BMI) of 40.0-44.9 in adult ([place]) 06/21/2014  . Mixed incontinence 06/25/2013  . Mixed urge and stress incontinence 06/25/2013  . Nocturia 06/25/2013  . Blurring of visual image 04/11/2012    Past Surgical History:  Procedure Laterality Date  . ABDOMINAL HYSTERECTOMY  1980  . APPENDECTOMY    . BACK SURGERY     2008  . BREAST BIOPSY Right 08/16   stereo fibroadenomatous change, neg for atypia  . BREAST EXCISIONAL BIOPSY Left    neg  . CARDIAC CATHETERIZATION Left 08/25/2016   Procedure: Left Heart Cath and Coronary Angiography;  Surgeon: Everlean Cherry  Nehemiah Massed, MD;  Location: [place] [facility];  Service: Cardiovascular;  Laterality: Left;  . CARDIOVERSION N/A 06/28/2017   Procedure: CARDIOVERSION;  Surgeon: Corey Skains, MD;  Location: [facility];  Service: Cardiovascular;  Laterality: N/A;  . CATARACT EXTRACTION, BILATERAL  2012  . CHOLECYSTECTOMY    . colon blockage  1999  . COLON SURGERY  1999  .  COLONOSCOPY  2016   polyps removed 2016  . DORSAL COMPARTMENT RELEASE Left 09/14/2016   Procedure: RELEASE DORSAL COMPARTMENT (DEQUERVAIN);  Surgeon: Dereck Leep, MD;  Location: [facility];  Service: Orthopedics;  Laterality: Left;  . ESOPHAGOGASTRODUODENOSCOPY N/A 01/27/2015   Procedure: ESOPHAGOGASTRODUODENOSCOPY (EGD);  Surgeon: Lollie Sails, MD;  Location: [facility];  Service: Endoscopy;  Laterality: N/A;  . ESOPHAGOGASTRODUODENOSCOPY (EGD) WITH PROPOFOL N/A 07/24/2015   Procedure: ESOPHAGOGASTRODUODENOSCOPY (EGD) WITH PROPOFOL;  Surgeon: Lucilla Lame, MD;  Location: [facility] ENDOSCOPY;  Service: Endoscopy;  Laterality: N/A;  . JOINT REPLACEMENT  2014   Bilateral Knee replacement  . LEAD REVISION/REPAIR N/A 08/29/2017   Procedure: LEAD REVISION/REPAIR;  Surgeon: Constance Haw, MD;  Location: [place] [facility];  Service: Cardiovascular;  Laterality: N/A;  . PACEMAKER INSERTION Left 07/01/2015   Procedure: INSERTION PACEMAKER;  Surgeon: Isaias Cowman, MD;  Location: [facility];  Service: Cardiovascular;  Laterality: Left;  . PACEMAKER REVISION N/A 08/28/2017   Procedure: PACEMAKER REVISION;  Surgeon: Deboraha Sprang, MD;  Location: [place] [facility];  Service: Cardiovascular;  Laterality: N/A;  . REPLACEMENT TOTAL KNEE BILATERAL    . SHOULDER ARTHROSCOPY WITH SUBACROMIAL DECOMPRESSION Left 2013  . TEE WITHOUT CARDIOVERSION N/A 06/28/2017   Procedure: TRANSESOPHAGEAL ECHOCARDIOGRAM (TEE);  Surgeon: Corey Skains, MD;  Location: [facility];  Service: Cardiovascular;  Laterality: N/A;    Prior to Admission medications   Medication Sig Start Date End Date Taking? Authorizing Provider  acetaminophen (TYLENOL) 500 MG tablet Take 1,000 mg by mouth every 6 (six) hours as needed for mild pain or moderate pain.     [provider]  allopurinol (ZYLOPRIM) 100 MG tablet Take 150 mg by mouth daily.     [provider]  apixaban (ELIQUIS) 5 MG TABS tablet Take 1  tablet (5 mg total) by mouth 2 (two) times daily. 10/25/17   Deboraha Sprang, MD  busPIRone (BUSPAR) 10 MG tablet Take 10 mg by mouth 2 (two) times daily.    [provider]  cholecalciferol (VITAMIN D) 1000 units tablet Take 1,000 Units by mouth daily.    [provider]  colchicine 0.6 MG tablet Take 0.6 mg by mouth daily as needed (gout).     [provider]  docusate sodium (COLACE) 100 MG capsule Take 100 mg by mouth at bedtime.     [provider]  HYDROcodone-acetaminophen (NORCO/VICODIN) 5-325 MG tablet Take 1 tablet by mouth every 6 (six) hours as needed for moderate pain. 08/30/17 08/30/18  Baldwin Jamaica, PA-C  hydrocortisone 2.5 % cream Apply 1 application topically daily as needed (rash).    [provider]  insulin NPH-regular Human (NOVOLIN 70/30) (70-30) 100 UNIT/ML injection Inject 22 Units into the skin 2 (two) times daily before a meal.     [provider]  ketoconazole (NIZORAL) 2 % cream Apply 1 application topically every 2 (two) hours as needed (for rash under breasts).     [provider]  metoprolol tartrate (LOPRESSOR) 25 MG tablet Take 12.5 mg by mouth 2 (two) times daily.    [provider]  ondansetron (ZOFRAN ODT) 4 MG disintegrating tablet Take 1 tablet (4 mg total) by mouth every 8 (eight) hours as needed for nausea or vomiting. 12/29/17   Darel Hong, MD  oxybutynin (DITROPAN) 5 MG tablet Take 0.5 tablets (2.5 mg total) by mouth at bedtime. Patient taking differently: Take 10 mg by mouth at bedtime.  06/13/17   Epifanio Lesches, MD  pantoprazole (PROTONIX) 40 MG tablet Take 1 tablet (40 mg total) by mouth 2 (two) times daily. 12/29/17   Darel Hong, MD  PARoxetine (PAXIL) 20 MG tablet Take 20 mg by mouth daily.    [provider]  Polyethyl Glycol-Propyl Glycol (SYSTANE OP) Apply 1 drop to eye daily as needed (dry eyes).    [provider]  pravastatin (PRAVACHOL) 20  MG tablet Take 20 mg by mouth at bedtime.     [provider]  talc (ZEASORB) powder Apply 1 application topically as needed (under the breast irritation).    [provider]  traZODone (DESYREL) 50 MG tablet Take 1 tablet (50 mg total) by mouth at bedtime. 06/13/17   Epifanio Lesches, MD    Allergies Cephalosporins; Macrolides and ketolides; Meperidine; Prednisone; Shellfish allergy; Sulfa antibiotics; Uloric [febuxostat]; Aspirin; Celecoxib; Cephalexin; Erythromycin; Hydromorphone; Iodinated diagnostic agents; Oxycodone; Atorvastatin; Codeine; Doxycycline; Tape; Valdecoxib; and Iodine  Family History  Problem Relation Age of Onset  . Diabetes Mellitus II Mother   . CAD Mother   . Heart attack Mother   . Cancer Father        skin  . Breast cancer Neg Hx     Social History Social History   Tobacco Use  . Smoking status: Never Smoker  . Smokeless tobacco: Never Used  Substance Use Topics  . Alcohol use: No  . Drug use: No    Review of Systems Constitutional: No fever/chills Eyes: No visual changes. ENT: No sore throat. Cardiovascular: Denies chest pain. Respiratory: Denies shortness of breath. Gastrointestinal: Positive for abdominal pain.  Positive for nausea, positive for vomiting.  No diarrhea.  No constipation. Genitourinary: Negative for dysuria. Musculoskeletal: Negative for back pain. Skin: Negative for rash. Neurological: Negative for headaches, focal weakness or numbness.   ____________________________________________   PHYSICAL EXAM:  VITAL SIGNS: ED Triage Vitals  Enc Vitals Group     BP 12/29/17 1344 (!) 152/41     Pulse Rate 12/29/17 1344 75     Resp 12/29/17 1344 18     Temp 12/29/17 1344 97.8 F (36.6 C)     Temp Source 12/29/17 1344 Oral     SpO2 12/29/17 1344 100 %     Weight 12/29/17 1345 249 lb (112.9 kg)     Height 12/29/17 1345 5\' 4"  (1.626 m)     Head Circumference --      Peak Flow --      Pain Score 12/29/17 1345  0     Pain Loc --      Pain Edu? --      Excl. in [place]? --     Constitutional: Alert and oriented x4 pleasant cooperative speaks in full clear sentences no diaphoresis Eyes: PERRL EOMI. Head: Atraumatic. Nose: No congestion/rhinnorhea. Mouth/Throat: No trismus Neck: No stridor.   Cardiovascular: Normal rate, regular rhythm. Grossly normal heart sounds.  Good peripheral circulation. Respiratory: Normal respiratory effort.  No retractions. Lungs CTAB and moving good air Gastrointestinal: Soft nondistended nontender no rebound no guarding or peritonitis Musculoskeletal: No lower extremity edema   Neurologic:  Normal speech and  language. No gross focal neurologic deficits are appreciated. Skin:  Skin is warm, dry and intact. No rash noted. Psychiatric: Mood and affect are normal. Speech and behavior are normal.    ____________________________________________   DIFFERENTIAL includes but not limited to  Upper GI bleed, lower GI bleed, Mallory-Weiss tear, Boerhaave syndrome ____________________________________________   LABS (all labs ordered are listed, but only abnormal results are displayed)  Labs Reviewed  COMPREHENSIVE METABOLIC PANEL - Abnormal; Notable for the following components:      Result Value   Potassium 5.2 (*)    Glucose, Bld 201 (*)    BUN 26 (*)    Creatinine, Ser 1.32 (*)    GFR calc non Af Amer 38 (*)    GFR calc Af Amer 44 (*)    All other components within normal limits  CBC  LIPASE, BLOOD  PROTIME-INR  APTT  TYPE AND SCREEN    Lab work reviewed by me with no signs of anemia __________________________________________  EKG   ____________________________________________  RADIOLOGY  Chest x-ray reviewed by me with no acute disease ____________________________________________   PROCEDURES  Procedure(s) performed: no  Procedures  Critical Care performed: no  Observation: no ____________________________________________   INITIAL  IMPRESSION / ASSESSMENT AND PLAN / ED COURSE  Pertinent labs & imaging results that were available during my care of the patient were reviewed by me and considered in my medical decision making (see chart for details).  The patient arrives hemodynamically stable and well-appearing.  She does have a sample of her vomitus at bedside which is light pink although with several clots.  This likely represents an acute upper GI bleed.  She does need to continue taking her Eliquis for her chronic atrial fibrillation.  I have advised her to begin taking a proton pump inhibitor in an effort to help stop the bleeding.  We will refer her to GI as an outpatient.  No indication for blood transfusion or emergent endoscopy.  Strict return precautions have been given and the patient verbalizes understanding and agreement with plan.      ____________________________________________   FINAL CLINICAL IMPRESSION(S) / ED DIAGNOSES  Final diagnoses:  Upper GI bleed      NEW MEDICATIONS STARTED DURING THIS VISIT:  Discharge Medication List as of 12/29/2017  3:21 PM    START taking these medications   Details  ondansetron (ZOFRAN ODT) 4 MG disintegrating tablet Take 1 tablet (4 mg total) by mouth every 8 (eight) hours as needed for nausea or vomiting., Starting Fri 12/29/2017, Print         Note:  This document was prepared using Dragon voice recognition software and may include unintentional dictation errors.     Darel Hong, MD 12/31/17 [id number]

## 2017-12-29 NOTE — ED Triage Notes (Signed)
Vomiting red looking emesis this morning x 2.  Reports abdominal pain this morning just prior to vomiting.  Patient states she has had polyps removed from stomach in the past.

## 2018-02-03 ENCOUNTER — Telehealth: Payer: Self-pay | Admitting: Cardiology

## 2018-02-03 NOTE — Telephone Encounter (Signed)
I was notified by the operator to call the patient.  The patient reports that this evening she experience recurrence of what she believes to be her AF. She has HR in the 120s and SBP in the 120s. She denies chest pain or dyspnea but does not some mild lightheadedness. She was previously on metoprolol 12.5 mg twice daily, but this was stopped for low BP.  We discussed different management options. She states that she will try taking a small dose (12.5 mg) of her metoprolol. If her symptoms worsen or do not improve, she will call 911/come to the ED. She was urged to closely monitor her VS and not ambulate or transfer unassisted until she can ensure her stability.

## 2018-02-04 ENCOUNTER — Telehealth: Payer: Self-pay | Admitting: Cardiology

## 2018-02-04 ENCOUNTER — Emergency Department (HOSPITAL_COMMUNITY): Payer: Medicare Other

## 2018-02-04 ENCOUNTER — Encounter (HOSPITAL_COMMUNITY): Payer: Self-pay | Admitting: Emergency Medicine

## 2018-02-04 ENCOUNTER — Other Ambulatory Visit: Payer: Self-pay

## 2018-02-04 ENCOUNTER — Inpatient Hospital Stay (HOSPITAL_COMMUNITY)
Admission: EM | Admit: 2018-02-04 | Discharge: 2018-02-06 | DRG: 309 | Disposition: A | Discharge status: Home / Self Care | Payer: Medicare Other | Attending: Internal Medicine | Admitting: Internal Medicine

## 2018-02-04 DIAGNOSIS — T461X5A Adverse effect of calcium-channel blockers, initial encounter: Secondary | ICD-10-CM | POA: Diagnosis not present

## 2018-02-04 DIAGNOSIS — I130 Hypertensive heart and chronic kidney disease with heart failure and stage 1 through stage 4 chronic kidney disease, or unspecified chronic kidney disease: Secondary | ICD-10-CM | POA: Diagnosis present

## 2018-02-04 DIAGNOSIS — E875 Hyperkalemia: Secondary | ICD-10-CM | POA: Diagnosis present

## 2018-02-04 DIAGNOSIS — I481 Persistent atrial fibrillation: Secondary | ICD-10-CM | POA: Diagnosis not present

## 2018-02-04 DIAGNOSIS — N3946 Mixed incontinence: Secondary | ICD-10-CM | POA: Diagnosis present

## 2018-02-04 DIAGNOSIS — M109 Gout, unspecified: Secondary | ICD-10-CM | POA: Diagnosis present

## 2018-02-04 DIAGNOSIS — I4821 Permanent atrial fibrillation: Secondary | ICD-10-CM | POA: Diagnosis present

## 2018-02-04 DIAGNOSIS — R300 Dysuria: Secondary | ICD-10-CM | POA: Diagnosis not present

## 2018-02-04 DIAGNOSIS — Z6841 Body Mass Index (BMI) 40.0 and over, adult: Secondary | ICD-10-CM | POA: Diagnosis not present

## 2018-02-04 DIAGNOSIS — M810 Age-related osteoporosis without current pathological fracture: Secondary | ICD-10-CM | POA: Diagnosis present

## 2018-02-04 DIAGNOSIS — N183 Chronic kidney disease, stage 3 (moderate): Secondary | ICD-10-CM | POA: Diagnosis present

## 2018-02-04 DIAGNOSIS — Z9842 Cataract extraction status, left eye: Secondary | ICD-10-CM

## 2018-02-04 DIAGNOSIS — I5032 Chronic diastolic (congestive) heart failure: Secondary | ICD-10-CM | POA: Diagnosis present

## 2018-02-04 DIAGNOSIS — I482 Chronic atrial fibrillation: Secondary | ICD-10-CM | POA: Diagnosis present

## 2018-02-04 DIAGNOSIS — I10 Essential (primary) hypertension: Secondary | ICD-10-CM | POA: Diagnosis present

## 2018-02-04 DIAGNOSIS — F331 Major depressive disorder, recurrent, moderate: Secondary | ICD-10-CM | POA: Diagnosis present

## 2018-02-04 DIAGNOSIS — I495 Sick sinus syndrome: Secondary | ICD-10-CM | POA: Diagnosis present

## 2018-02-04 DIAGNOSIS — I952 Hypotension due to drugs: Secondary | ICD-10-CM | POA: Diagnosis not present

## 2018-02-04 DIAGNOSIS — R0789 Other chest pain: Secondary | ICD-10-CM

## 2018-02-04 DIAGNOSIS — E1122 Type 2 diabetes mellitus with diabetic chronic kidney disease: Secondary | ICD-10-CM | POA: Diagnosis present

## 2018-02-04 DIAGNOSIS — Z91048 Other nonmedicinal substance allergy status: Secondary | ICD-10-CM

## 2018-02-04 DIAGNOSIS — K760 Fatty (change of) liver, not elsewhere classified: Secondary | ICD-10-CM | POA: Diagnosis present

## 2018-02-04 DIAGNOSIS — I4892 Unspecified atrial flutter: Secondary | ICD-10-CM | POA: Diagnosis present

## 2018-02-04 DIAGNOSIS — I959 Hypotension, unspecified: Secondary | ICD-10-CM | POA: Diagnosis present

## 2018-02-04 DIAGNOSIS — Z91041 Radiographic dye allergy status: Secondary | ICD-10-CM

## 2018-02-04 DIAGNOSIS — E1140 Type 2 diabetes mellitus with diabetic neuropathy, unspecified: Secondary | ICD-10-CM | POA: Diagnosis present

## 2018-02-04 DIAGNOSIS — E11649 Type 2 diabetes mellitus with hypoglycemia without coma: Secondary | ICD-10-CM | POA: Diagnosis not present

## 2018-02-04 DIAGNOSIS — Z7901 Long term (current) use of anticoagulants: Secondary | ICD-10-CM

## 2018-02-04 DIAGNOSIS — E872 Acidosis: Secondary | ICD-10-CM | POA: Diagnosis present

## 2018-02-04 DIAGNOSIS — I480 Paroxysmal atrial fibrillation: Secondary | ICD-10-CM | POA: Diagnosis present

## 2018-02-04 DIAGNOSIS — Z886 Allergy status to analgesic agent status: Secondary | ICD-10-CM

## 2018-02-04 DIAGNOSIS — Z882 Allergy status to sulfonamides status: Secondary | ICD-10-CM

## 2018-02-04 DIAGNOSIS — Z8249 Family history of ischemic heart disease and other diseases of the circulatory system: Secondary | ICD-10-CM

## 2018-02-04 DIAGNOSIS — Z90710 Acquired absence of both cervix and uterus: Secondary | ICD-10-CM

## 2018-02-04 DIAGNOSIS — I4891 Unspecified atrial fibrillation: Secondary | ICD-10-CM | POA: Diagnosis present

## 2018-02-04 DIAGNOSIS — E1165 Type 2 diabetes mellitus with hyperglycemia: Secondary | ICD-10-CM | POA: Diagnosis present

## 2018-02-04 DIAGNOSIS — K219 Gastro-esophageal reflux disease without esophagitis: Secondary | ICD-10-CM | POA: Diagnosis present

## 2018-02-04 DIAGNOSIS — I429 Cardiomyopathy, unspecified: Secondary | ICD-10-CM | POA: Diagnosis present

## 2018-02-04 DIAGNOSIS — E782 Mixed hyperlipidemia: Secondary | ICD-10-CM | POA: Diagnosis present

## 2018-02-04 DIAGNOSIS — Z79899 Other long term (current) drug therapy: Secondary | ICD-10-CM

## 2018-02-04 DIAGNOSIS — Z881 Allergy status to other antibiotic agents status: Secondary | ICD-10-CM

## 2018-02-04 DIAGNOSIS — F419 Anxiety disorder, unspecified: Secondary | ICD-10-CM | POA: Diagnosis present

## 2018-02-04 DIAGNOSIS — Z833 Family history of diabetes mellitus: Secondary | ICD-10-CM

## 2018-02-04 DIAGNOSIS — Z972 Presence of dental prosthetic device (complete) (partial): Secondary | ICD-10-CM

## 2018-02-04 DIAGNOSIS — Z888 Allergy status to other drugs, medicaments and biological substances status: Secondary | ICD-10-CM

## 2018-02-04 DIAGNOSIS — Z8673 Personal history of transient ischemic attack (TIA), and cerebral infarction without residual deficits: Secondary | ICD-10-CM

## 2018-02-04 DIAGNOSIS — Z885 Allergy status to narcotic agent status: Secondary | ICD-10-CM

## 2018-02-04 DIAGNOSIS — N289 Disorder of kidney and ureter, unspecified: Secondary | ICD-10-CM

## 2018-02-04 DIAGNOSIS — Z950 Presence of cardiac pacemaker: Secondary | ICD-10-CM

## 2018-02-04 DIAGNOSIS — Z808 Family history of malignant neoplasm of other organs or systems: Secondary | ICD-10-CM

## 2018-02-04 DIAGNOSIS — Z91013 Allergy to seafood: Secondary | ICD-10-CM

## 2018-02-04 DIAGNOSIS — Z9841 Cataract extraction status, right eye: Secondary | ICD-10-CM

## 2018-02-04 DIAGNOSIS — N184 Chronic kidney disease, stage 4 (severe): Secondary | ICD-10-CM | POA: Diagnosis present

## 2018-02-04 DIAGNOSIS — Z96653 Presence of artificial knee joint, bilateral: Secondary | ICD-10-CM | POA: Diagnosis present

## 2018-02-04 DIAGNOSIS — Z86010 Personal history of colonic polyps: Secondary | ICD-10-CM

## 2018-02-04 DIAGNOSIS — Z9049 Acquired absence of other specified parts of digestive tract: Secondary | ICD-10-CM

## 2018-02-04 DIAGNOSIS — Z794 Long term (current) use of insulin: Secondary | ICD-10-CM

## 2018-02-04 DIAGNOSIS — N189 Chronic kidney disease, unspecified: Secondary | ICD-10-CM | POA: Diagnosis present

## 2018-02-04 LAB — URINALYSIS, ROUTINE W REFLEX MICROSCOPIC
Bilirubin Urine: NEGATIVE
Glucose, UA: NEGATIVE mg/dL
HGB URINE DIPSTICK: NEGATIVE
Ketones, ur: NEGATIVE mg/dL
Nitrite: NEGATIVE
Protein, ur: NEGATIVE mg/dL
SPECIFIC GRAVITY, URINE: 1.013 (ref 1.005–1.030)
pH: 5 (ref 5.0–8.0)

## 2018-02-04 LAB — CBC
HCT: 43 % (ref 36.0–46.0)
HEMOGLOBIN: 14.4 g/dL (ref 12.0–15.0)
MCH: 31.7 pg (ref 26.0–34.0)
MCHC: 33.5 g/dL (ref 30.0–36.0)
MCV: 94.7 fL (ref 78.0–100.0)
Platelets: 213 10*3/uL (ref 150–400)
RBC: 4.54 MIL/uL (ref 3.87–5.11)
RDW: 11.7 % (ref 11.5–15.5)
WBC: 5 10*3/uL (ref 4.0–10.5)

## 2018-02-04 LAB — BASIC METABOLIC PANEL
Anion gap: 12 (ref 5–15)
BUN: 22 mg/dL — AB (ref 6–20)
CHLORIDE: 102 mmol/L (ref 101–111)
CO2: 20 mmol/L — ABNORMAL LOW (ref 22–32)
CREATININE: 1.45 mg/dL — AB (ref 0.44–1.00)
Calcium: 9.1 mg/dL (ref 8.9–10.3)
GFR calc non Af Amer: 34 mL/min — ABNORMAL LOW (ref >60)
GFR, EST AFRICAN AMERICAN: 39 mL/min — AB (ref >60)
Glucose, Bld: 301 mg/dL — ABNORMAL HIGH (ref 65–99)
POTASSIUM: 5.5 mmol/L — AB (ref 3.5–5.1)
SODIUM: 134 mmol/L — AB (ref 135–145)

## 2018-02-04 LAB — I-STAT TROPONIN, ED
TROPONIN I, POC: 0.02 ng/mL (ref 0.00–0.08)
Troponin i, poc: 0.06 ng/mL (ref 0.00–0.08)

## 2018-02-04 LAB — I-STAT CG4 LACTIC ACID, ED
LACTIC ACID, VENOUS: 2.27 mmol/L — AB (ref 0.5–1.9)
LACTIC ACID, VENOUS: 1.52 mmol/L (ref 0.5–1.9)

## 2018-02-04 MED ORDER — FENTANYL CITRATE (PF) 100 MCG/2ML IJ SOLN
50.0000 ug | Freq: Once | INTRAMUSCULAR | Status: AC
Start: 2018-02-04 — End: 2018-02-04
  Administered 2018-02-04: 50 ug via INTRAVENOUS
  Filled 2018-02-04: qty 2

## 2018-02-04 MED ORDER — VERAPAMIL HCL ER 240 MG PO TBCR
240.0000 mg | EXTENDED_RELEASE_TABLET | Freq: Every day | ORAL | Status: DC
  Administered 2018-02-04: 240 mg via ORAL
  Filled 2018-02-04 (×3): qty 1

## 2018-02-04 MED ORDER — GI COCKTAIL ~~LOC~~
30.0000 mL | Freq: Once | ORAL | Status: AC
  Administered 2018-02-04: 30 mL via ORAL
  Filled 2018-02-04: qty 30

## 2018-02-04 MED ORDER — VERAPAMIL HCL ER 240 MG PO TBCR: 240.0000 mg | EXTENDED_RELEASE_TABLET | Freq: Every day | ORAL | 6 refills | Status: DC

## 2018-02-04 MED ORDER — ONDANSETRON HCL 4 MG/2ML IJ SOLN
4.0000 mg | Freq: Once | INTRAMUSCULAR | Status: AC
  Administered 2018-02-04: 4 mg via INTRAVENOUS
  Filled 2018-02-04: qty 2

## 2018-02-04 MED ORDER — LORAZEPAM 2 MG/ML IJ SOLN
0.5000 mg | Freq: Once | INTRAMUSCULAR | Status: AC
  Administered 2018-02-04: 0.5 mg via INTRAVENOUS
  Filled 2018-02-04: qty 1

## 2018-02-04 MED ORDER — PRAVASTATIN SODIUM 20 MG PO TABS
20.0000 mg | ORAL_TABLET | Freq: Every day | ORAL | Status: DC
  Administered 2018-02-04 – 2018-02-05 (×2): 20 mg via ORAL
  Filled 2018-02-04: qty 1

## 2018-02-04 MED ORDER — DILTIAZEM HCL-DEXTROSE 100-5 MG/100ML-% IV SOLN (PREMIX)
5.0000 mg/h | Freq: Once | INTRAVENOUS | Status: AC
  Administered 2018-02-04: 5 mg/h via INTRAVENOUS
  Filled 2018-02-04: qty 100

## 2018-02-04 MED ORDER — DILTIAZEM HCL 25 MG/5ML IV SOLN
10.0000 mg | Freq: Once | INTRAVENOUS | Status: AC
  Administered 2018-02-04: 10 mg via INTRAVENOUS

## 2018-02-04 MED ORDER — MORPHINE SULFATE (PF) 4 MG/ML IV SOLN
4.0000 mg | Freq: Once | INTRAVENOUS | Status: AC
  Administered 2018-02-04: 4 mg via INTRAVENOUS
  Filled 2018-02-04: qty 1

## 2018-02-04 MED ORDER — TRAZODONE HCL 50 MG PO TABS
50.0000 mg | ORAL_TABLET | Freq: Every day | ORAL | Status: DC
  Administered 2018-02-04 – 2018-02-05 (×2): 50 mg via ORAL
  Filled 2018-02-04: qty 1

## 2018-02-04 MED ORDER — DOCUSATE SODIUM 100 MG PO CAPS
100.0000 mg | ORAL_CAPSULE | Freq: Every day | ORAL | Status: DC
Start: 2018-02-04 — End: 2018-02-06
  Administered 2018-02-04 – 2018-02-05 (×2): 100 mg via ORAL
  Filled 2018-02-04: qty 1

## 2018-02-04 MED ORDER — OXYBUTYNIN CHLORIDE 5 MG PO TABS
10.0000 mg | ORAL_TABLET | Freq: Two times a day (BID) | ORAL | Status: DC
  Administered 2018-02-04 – 2018-02-06 (×3): 10 mg via ORAL
  Filled 2018-02-04 (×2): qty 2

## 2018-02-04 MED ORDER — GI COCKTAIL ~~LOC~~: 30.0000 mL | Freq: Once | ORAL | Status: DC

## 2018-02-04 MED ORDER — SODIUM CHLORIDE 0.9 % IV BOLUS
1000.0000 mL | Freq: Once | INTRAVENOUS | Status: AC
  Administered 2018-02-04: 1000 mL via INTRAVENOUS

## 2018-02-04 MED ORDER — DIPHENHYDRAMINE HCL 50 MG/ML IJ SOLN
INTRAMUSCULAR | Status: AC
  Filled 2018-02-04: qty 1

## 2018-02-04 MED ORDER — APIXABAN 5 MG PO TABS
5.0000 mg | ORAL_TABLET | Freq: Two times a day (BID) | ORAL | Status: DC
  Administered 2018-02-05 – 2018-02-06 (×2): 5 mg via ORAL
  Filled 2018-02-04 (×4): qty 1

## 2018-02-04 MED ORDER — METOPROLOL TARTRATE 5 MG/5ML IV SOLN: 5.0000 mg | Freq: Once | INTRAVENOUS | Status: DC

## 2018-02-04 MED ORDER — PANTOPRAZOLE SODIUM 40 MG PO TBEC
40.0000 mg | DELAYED_RELEASE_TABLET | Freq: Two times a day (BID) | ORAL | Status: DC
  Administered 2018-02-05 – 2018-02-06 (×2): 40 mg via ORAL
  Filled 2018-02-04 (×2): qty 1

## 2018-02-04 MED ORDER — BUSPIRONE HCL 5 MG PO TABS
10.0000 mg | ORAL_TABLET | Freq: Two times a day (BID) | ORAL | Status: DC
  Administered 2018-02-04 – 2018-02-06 (×3): 10 mg via ORAL
  Filled 2018-02-04 (×2): qty 2

## 2018-02-04 MED ORDER — ASPIRIN 81 MG PO CHEW
324.0000 mg | CHEWABLE_TABLET | Freq: Once | ORAL | Status: AC
Start: 2018-02-04 — End: 2018-02-04
  Administered 2018-02-04: 324 mg via ORAL
  Filled 2018-02-04: qty 4

## 2018-02-04 NOTE — ED Notes (Signed)
Called pharmacy to send meds

## 2018-02-04 NOTE — ED Notes (Signed)
Urine culture sample

## 2018-02-04 NOTE — ED Provider Notes (Addendum)
11:50 amCOMPlains of anterior chest pain that yesterday. She noted her heart was racing. She called the on-call cardiologist last night who told her to take metoprolol which she has taken, without relief. Symptoms are constant. No other associated symptoms. On exam patient is anxious appearing. Heart tachycardic regular rhythm lungs clear auscultation abdomen nondistended nontender. Intravenous Cardizem bolus and Cardizem drip ordered   1:50 pm PATIENT ASYMPTOMATIC PAIN-FREE AFTER TREATMENT WITH INTRAVENOUS cARDIZEM BOLUS AND cARDIZEM  INTRAVENOUS DRIP. She also received IV opioids and Ativan. She appears comfortable and in no distress     Orlie Dakin, MD 02/04/18 1354 CRITICAL CARE Performed by: Orlie Dakin Total critical care time: 30 minutes Critical care time was exclusive of separately billable procedures and treating other patients. Critical care was necessary to treat or prevent imminent or life-threatening deterioration. Critical care was time spent personally by me on the following activities: development of treatment plan with patient and/or surrogate as well as nursing, discussions with consultants, evaluation of patient's response to treatment, examination of patient, obtaining history from patient or surrogate, ordering and performing treatments and interventions, ordering and review of laboratory studies, ordering and review of radiographic studies, pulse oximetry and re-evaluation of patient's condition.   Orlie Dakin, MD 02/04/18 1743

## 2018-02-04 NOTE — Telephone Encounter (Signed)
Pt continues in a fib and now with increased chest tightness.  I have advised her to come to ER Her husband can bring, but if increase of sxs to have him pull over and call 911.  She is agreeable.

## 2018-02-04 NOTE — ED Triage Notes (Signed)
Pt. Stated, Im having A-Fib. Making me be just really uncomfortable and sometimes light headedness.

## 2018-02-04 NOTE — ED Notes (Signed)
Attempting to page cardiology.

## 2018-02-04 NOTE — ED Notes (Signed)
Orthostatics Not Required at this time.Marland KitchenMarland Kitchen

## 2018-02-04 NOTE — ED Notes (Signed)
Pt gave herself 22 units of her home insulin for a home cbg of 228.  Eating [place] sandwich.  Sitting up in bed.

## 2018-02-04 NOTE — ED Notes (Signed)
Called src re missing meal tray

## 2018-02-04 NOTE — ED Notes (Addendum)
Pt becoming increasingly hypertensive, though remains asymptomatic.  Pt is not diaphoretic, not dizzy.  Pt is ao x 4 and is sitting up in bed eating.  BP taken manually with doppler and was 76/palp. MD notified.

## 2018-02-04 NOTE — ED Notes (Signed)
Ordered early dinner tray for pt.

## 2018-02-04 NOTE — ED Notes (Signed)
Care order/instruction: Dr. Rhae Hammock will check pt in next hour to see if can go home. I sent script in for verapamil. And did her med list, sent message for follow up appt in next week to 2 weeks.  [name].

## 2018-02-04 NOTE — ED Notes (Signed)
Please contact Deborah Obrien, pt's husband, with any updates.  [phone] (h), and [phone] (C).

## 2018-02-04 NOTE — ED Provider Notes (Signed)
  Physical Exam  BP (!) 95/41   Pulse 80   Temp 97.6 F (36.4 C) (Oral)   Resp 16   Ht 5' 4.5" (1.638 m)   Wt 112.9 kg (249 lb)   SpO2 99%   BMI 42.08 kg/m   Physical Exam  ED Course/Procedures     Procedures  MDM  Assuming care of patient from [place].   Patient in the ED for A. fib with RVR.  Patient is having dizziness and some chest discomfort. Workup thus far shows normal labs.  Patient's heart rate was elevated.  She is in permanent A. fib and cardiology was called for their recommendations.  Apparently patient was stopped on metoprolol because of her low blood pressure. Cardiology team's note shows that they had given patient verapamil and we are going to reassess her.  At the time of reassessment patient's heart rate was better but her blood pressure was low, confirmed with manual checks.  According to cardiology team, they would appreciate if medicine will admit the patient.  Patient had no complains, no concerns from the nursing side. Will continue to monitor.  Patient was reassessed multiple times by me.  Her heart rate indeed had improved but with the low blood pressure she was not symptomatic.  We have continue to monitor the patient closely for her low blood pressure and over time it has improved.    CRITICAL CARE Performed by: Josemanuel Eakins   Total critical care time: 32 minutes  Critical care time was exclusive of separately billable procedures and treating other patients.  Critical care was necessary to treat or prevent imminent or life-threatening deterioration.  Critical care was time spent personally by me on the following activities: development of treatment plan with patient and/or surrogate as well as nursing, discussions with consultants, evaluation of patient's response to treatment, examination of patient, obtaining history from patient or surrogate, ordering and performing treatments and interventions, ordering and review of laboratory  studies, ordering and review of radiographic studies, pulse oximetry and re-evaluation of patient's condition.       Varney Biles, MD 02/04/18 2141

## 2018-02-04 NOTE — ED Notes (Signed)
Dr Rhae Hammock at bedside.  Notified of pt's drop in pressures with verapamil.

## 2018-02-04 NOTE — H&P (Signed)
History and Physical    Deborah Obrien VEH:[id number] DOB: [birth year] DOA: 02/04/2018  Referring MD/NP/PA: Dr Dan Europe  PCP: [name], MD Outpatient Specialists: Dr Marya Amsler taylor  Patient coming from: Home  Chief Complaint: shortness of breath and palpitation  HPI: Deborah Obrien is a 77 y.o. female with medical history significant of paroxysmal atrial fibrillation with symptomatic bradycardia status post single-chamber pacemaker insertion, diastolic CHF, diabetes, hypertension and morbid obesity who presented to the ER with shortness of breath as well as palpitations. Patient was found to be in atrial fibrillation with atrial flutter at the rate of 130s to 140s. She was previously able to be on metoprolol and a Medrol but has significant hypotension and this morning she was treated with morphine sulfate as well as IV Cardizem. Patient's rate initially improved. Cardiology was consulted and patient was given verapamil. IV Cardizem was discontinued. Patient has now developed significant hypotension. She is therefore being admitted to the hospital so cardiology can arrange and address her medications for home use. She denied any active chest pain. No nausea vomiting or diarrhea.  ED Course: Patient's initial vitals include heart rate of 131 with blood pressure of 70/47. Urinalysis suggest UTI or symptomatic bacteriuria, lactic acid 2.27, sodium is 134 with potassium 5.5. Glucose 301 BUN 22 and creatinine 1.45. Patient was treated with Cardizem drip and has been discontinued. Also verapamil which has also been discontinued cardiology has seen patient and recommended hospitalization  Review of Systems: As per HPI otherwise 10 point review of systems negative.   Past Medical History:  Diagnosis Date  . A-fib ([place]) 2016  . Acid reflux   . Anxiety   . Bowel obstruction ([place])   . CHF (congestive heart failure) ([place]) 2017  . Chronic kidney disease (CKD) stage G3a/A1, moderately  decreased glomerular filtration rate (GFR) between 45-59 mL/min/1.73 square meter and albuminuria creatinine ratio less than 30 mg/g (HCC)   . Diabetes mellitus without complication ([place])   . Dyspnea   . Fatty liver   . GI bleed   . Gout   . Hypertension   . Morbid obesity ([place])   . Osteoarthritis   . Osteoporosis   . Paroxysmal atrial fibrillation (HCC)   . Presence of permanent cardiac pacemaker   . Sinus node dysfunction ([place]) 08/24/2017  . TIA (transient ischemic attack)     Past Surgical History:  Procedure Laterality Date  . ABDOMINAL HYSTERECTOMY  1980  . APPENDECTOMY    . BACK SURGERY     2008  . BREAST BIOPSY Right 08/16   stereo fibroadenomatous change, neg for atypia  . BREAST EXCISIONAL BIOPSY Left    neg  . CARDIAC CATHETERIZATION Left 08/25/2016   Procedure: Left Heart Cath and Coronary Angiography;  Surgeon: Corey Skains, MD;  Location: [place] [facility];  Service: Cardiovascular;  Laterality: Left;  . CARDIOVERSION N/A 06/28/2017   Procedure: CARDIOVERSION;  Surgeon: Corey Skains, MD;  Location: [facility];  Service: Cardiovascular;  Laterality: N/A;  . CATARACT EXTRACTION, BILATERAL  2012  . CHOLECYSTECTOMY    . colon blockage  1999  . COLON SURGERY  1999  . COLONOSCOPY  2016   polyps removed 2016  . DORSAL COMPARTMENT RELEASE Left 09/14/2016   Procedure: RELEASE DORSAL COMPARTMENT (DEQUERVAIN);  Surgeon: Dereck Leep, MD;  Location: [facility];  Service: Orthopedics;  Laterality: Left;  . ESOPHAGOGASTRODUODENOSCOPY N/A 01/27/2015   Procedure: ESOPHAGOGASTRODUODENOSCOPY (EGD);  Surgeon: Lollie Sails, MD;  Location: [facility];  Service: Endoscopy;  Laterality: N/A;  . ESOPHAGOGASTRODUODENOSCOPY (EGD) WITH PROPOFOL N/A 07/24/2015   Procedure: ESOPHAGOGASTRODUODENOSCOPY (EGD) WITH PROPOFOL;  Surgeon: Lucilla Lame, MD;  Location: [facility] ENDOSCOPY;  Service: Endoscopy;  Laterality: N/A;  . JOINT REPLACEMENT  2014   Bilateral Knee replacement  .  LEAD REVISION/REPAIR N/A 08/29/2017   Procedure: LEAD REVISION/REPAIR;  Surgeon: Constance Haw, MD;  Location: [place] [facility];  Service: Cardiovascular;  Laterality: N/A;  . PACEMAKER INSERTION Left 07/01/2015   Procedure: INSERTION PACEMAKER;  Surgeon: Isaias Cowman, MD;  Location: [facility];  Service: Cardiovascular;  Laterality: Left;  . PACEMAKER REVISION N/A 08/28/2017   Procedure: PACEMAKER REVISION;  Surgeon: Deboraha Sprang, MD;  Location: [place] [facility];  Service: Cardiovascular;  Laterality: N/A;  . REPLACEMENT TOTAL KNEE BILATERAL    . SHOULDER ARTHROSCOPY WITH SUBACROMIAL DECOMPRESSION Left 2013  . TEE WITHOUT CARDIOVERSION N/A 06/28/2017   Procedure: TRANSESOPHAGEAL ECHOCARDIOGRAM (TEE);  Surgeon: Corey Skains, MD;  Location: [facility];  Service: Cardiovascular;  Laterality: N/A;     reports that she has never smoked. She has never used smokeless tobacco. She reports that she does not drink alcohol or use drugs.  Allergies  Allergen Reactions  . Cephalosporins Other (See Comments)    Reaction:  Unknown   . Macrolides And Ketolides Other (See Comments)    Reaction:  Unknown   . Meperidine Shortness Of Breath, Nausea Only and Other (See Comments)    Reaction:  Stomach pain   . Prednisone Anaphylaxis and Other (See Comments)    Reaction:  Increases pts blood sugar  Pt states that she is allergic to all steroids.    . Shellfish Allergy Anaphylaxis, Shortness Of Breath, Diarrhea, Nausea Only and Rash    Reaction:  Stomach pain   . Sulfa Antibiotics Shortness Of Breath, Diarrhea, Nausea Only and Other (See Comments)    Reaction:  Stomach pain   . Uloric [Febuxostat] Anaphylaxis    Locks pt's body up   . Aspirin Other (See Comments)    Reaction:  Stomach pain   . Celecoxib Other (See Comments)    Reaction:  GI bleed, weakness, and stomach pain.    . Cephalexin Diarrhea, Nausea Only and Other (See Comments)    Reaction:  Stomach pain   . Erythromycin  Diarrhea, Nausea Only and Other (See Comments)    Reaction:  Stomach pain   . Hydromorphone Other (See Comments)    Reaction:  Hypotension   . Iodinated Diagnostic Agents Rash and Other (See Comments)    Pt states that she is unable to have because she has chronic kidney disease.    Marland Kitchen Oxycodone Other (See Comments)    Reaction:  Stomach pain   . Atorvastatin Nausea Only and Other (See Comments)    Reaction:  Weakness   . Codeine Diarrhea, Nausea Only and Other (See Comments)    Reaction:  Stomach pain Pt tolerates morphine   . Doxycycline     Stomach pain   . Tape Other (See Comments)    Reaction:  Causes pts skin to tear  Pt states that she is able to use paper tape.    . Valdecoxib Swelling and Other (See Comments)    Pt states that her hands and feet swell.    . Iodine Rash    Family History  Problem Relation Age of Onset  . Diabetes Mellitus II Mother   . CAD Mother   . Heart attack Mother   .  Cancer Father        skin  . Breast cancer Neg Hx      Prior to Admission medications   Medication Sig Start Date End Date Taking? Authorizing Provider  acetaminophen (TYLENOL) 500 MG tablet Take 1,000 mg by mouth every 6 (six) hours as needed for mild pain or moderate pain.    Yes [provider]  allopurinol (ZYLOPRIM) 100 MG tablet Take 150 mg by mouth daily.    Yes [provider]  apixaban (ELIQUIS) 5 MG TABS tablet Take 1 tablet (5 mg total) by mouth 2 (two) times daily. 10/25/17  Yes Deboraha Sprang, MD  busPIRone (BUSPAR) 10 MG tablet Take 10 mg by mouth 2 (two) times daily.   Yes [provider]  cholecalciferol (VITAMIN D) 1000 units tablet Take 1,000 Units by mouth daily.   Yes [provider]  colchicine 0.6 MG tablet Take 0.6 mg by mouth daily as needed (gout).    Yes [provider]  docusate sodium (COLACE) 100 MG capsule Take 100 mg by mouth at bedtime.    Yes [provider]  hydrocortisone 2.5 % cream Apply 1  application topically daily as needed (rash).   Yes [provider]  insulin NPH-regular Human (NOVOLIN 70/30) (70-30) 100 UNIT/ML injection Inject 22 Units into the skin 2 (two) times daily before a meal.    Yes [provider]  ketoconazole (NIZORAL) 2 % cream Apply 1 application topically every 2 (two) hours as needed (for rash under breasts).    Yes [provider]  metoprolol tartrate (LOPRESSOR) 25 MG tablet Take 12.5 mg by mouth 2 (two) times daily.   Yes [provider]  oxybutynin (DITROPAN) 5 MG tablet Take 0.5 tablets (2.5 mg total) by mouth at bedtime. Patient taking differently: Take 10 mg by mouth 2 (two) times daily.  06/13/17  Yes Epifanio Lesches, MD  pantoprazole (PROTONIX) 40 MG tablet Take 1 tablet (40 mg total) by mouth 2 (two) times daily. 12/29/17  Yes Darel Hong, MD  PARoxetine (PAXIL) 20 MG tablet Take 20 mg by mouth daily.   Yes [provider]  Polyethyl Glycol-Propyl Glycol (SYSTANE OP) Apply 1 drop to eye daily as needed (dry eyes).   Yes [provider]  pravastatin (PRAVACHOL) 20 MG tablet Take 20 mg by mouth at bedtime.    Yes [provider]  talc (ZEASORB) powder Apply 1 application topically as needed (under the breast irritation).   Yes [provider]  traZODone (DESYREL) 50 MG tablet Take 1 tablet (50 mg total) by mouth at bedtime. 06/13/17  Yes Epifanio Lesches, MD  HYDROcodone-acetaminophen (NORCO/VICODIN) 5-325 MG tablet Take 1 tablet by mouth every 6 (six) hours as needed for moderate pain. Patient not taking: Reported on 02/04/2018 08/30/17 08/30/18  Baldwin Jamaica, PA-C  verapamil (CALAN-SR) 240 MG CR tablet Take 1 tablet (240 mg total) by mouth daily. 02/05/18   Isaiah Serge, NP    Physical Exam: Vitals:   02/04/18 2015 02/04/18 2034 02/04/18 2045 02/04/18 2100  BP:  (!) 95/41    Pulse: 81 80 79 80  Resp: 16 (!) 24 13 16   Temp:      TempSrc:      SpO2: 99% 100%  99% 99%  Weight:      Height:          Constitutional: NAD, calm, comfortable Vitals:   02/04/18 2015 02/04/18 2034 02/04/18 2045 02/04/18 2100  BP:  Marland Kitchen)  95/41    Pulse: 81 80 79 80  Resp: 16 (!) 24 13 16   Temp:      TempSrc:      SpO2: 99% 100% 99% 99%  Weight:      Height:       Eyes: PERRL, lids and conjunctivae normal ENMT: Mucous membranes are moist. Posterior pharynx clear of any exudate or lesions.Normal dentition.  Neck: normal, supple, no masses, no thyromegaly Respiratory: clear to auscultation bilaterally, no wheezing, no crackles. Normal respiratory effort. No accessory muscle use.  Cardiovascular: irregularly irregular rate and rhythm, no murmurs / rubs / gallops. No extremity edema. 2+ pedal pulses. No carotid bruits.  Abdomen: no tenderness, no masses palpated. No hepatosplenomegaly. Bowel sounds positive.  Musculoskeletal: no clubbing / cyanosis. No joint deformity upper and lower extremities. Good ROM, no contractures. Normal muscle tone.  Skin: no rashes, lesions, ulcers. No induration Neurologic: CN 2-12 grossly intact. Sensation intact, DTR normal. Strength 5/5 in all 4.  Psychiatric: Normal judgment and insight. Alert and oriented x 3. Normal mood.   Labs on Admission: I have personally reviewed following labs and imaging studies  CBC: Recent Labs  Lab 02/04/18 1048  WBC 5.0  HGB 14.4  HCT 43.0  MCV 94.7  PLT 321   Basic Metabolic Panel: Recent Labs  Lab 02/04/18 1048  NA 134*  K 5.5*  CL 102  CO2 20*  GLUCOSE 301*  BUN 22*  CREATININE 1.45*  CALCIUM 9.1   GFR: Estimated Creatinine Clearance: 40.4 mL/min (A) (by C-G formula based on SCr of 1.45 mg/dL (H)). Liver Function Tests: No results for input(s): AST, ALT, ALKPHOS, BILITOT, PROT, ALBUMIN in the last 168 hours. No results for input(s): LIPASE, AMYLASE in the last 168 hours. No results for input(s): AMMONIA in the last 168 hours. Coagulation Profile: No results for input(s): INR,  PROTIME in the last 168 hours. Cardiac Enzymes: No results for input(s): CKTOTAL, CKMB, CKMBINDEX, TROPONINI in the last 168 hours. BNP (last 3 results) No results for input(s): PROBNP in the last 8760 hours. HbA1C: No results for input(s): HGBA1C in the last 72 hours. CBG: No results for input(s): GLUCAP in the last 168 hours. Lipid Profile: No results for input(s): CHOL, HDL, LDLCALC, TRIG, CHOLHDL, LDLDIRECT in the last 72 hours. Thyroid Function Tests: No results for input(s): TSH, T4TOTAL, FREET4, T3FREE, THYROIDAB in the last 72 hours. Anemia Panel: No results for input(s): VITAMINB12, FOLATE, FERRITIN, TIBC, IRON, RETICCTPCT in the last 72 hours. Urine analysis:    Component Value Date/Time   COLORURINE YELLOW 02/04/2018 2016   APPEARANCEUR CLOUDY (A) 02/04/2018 2016   APPEARANCEUR Hazy 10/29/2012 1534   LABSPEC 1.013 02/04/2018 2016   LABSPEC 1.010 10/29/2012 1534   PHURINE 5.0 02/04/2018 2016   GLUCOSEU NEGATIVE 02/04/2018 2016   GLUCOSEU Negative 10/29/2012 1534   HGBUR NEGATIVE 02/04/2018 2016   [place] NEGATIVE 02/04/2018 2016   BILIRUBINUR Negative 10/29/2012 [place] 02/04/2018 2016   PROTEINUR NEGATIVE 02/04/2018 2016   NITRITE NEGATIVE 02/04/2018 2016   LEUKOCYTESUR LARGE (A) 02/04/2018 2016   LEUKOCYTESUR Negative 10/29/2012 1534   Sepsis Labs: @LABRCNTIP (procalcitonin:4,lacticidven:4) )No results found for this or any previous visit (from the past 240 hour(s)).   Radiological Exams on Admission: Dg Chest Port 1 View  Result Date: 02/04/2018 CLINICAL DATA:  Atrial fibrillation. EXAM: PORTABLE CHEST 1 VIEW COMPARISON:  12/29/2017 FINDINGS: There is a left chest wall pacer device with leads in the right atrial appendage and right ventricle. There  is aortic atherosclerotic calcifications noted. No pleural effusion or edema. No airspace opacities. IMPRESSION: 1. No active disease. 2.  Aortic Atherosclerosis (ICD10-I70.0). Electronically  Signed   By: Kerby Moors M.D.   On: 02/04/2018 10:51    EKG: Independently reviewed. Atrial fibrillation with a rate of 128, no significant ST changes Assessment/Plan Principal Problem:   Atrial fibrillation (HCC) Active Problems:   Chronic kidney disease, stage 3, mod decreased GFR (HCC)   Essential hypertension   Sinus node dysfunction (HCC)   Hypotension   A-fib (HCC)    #1 atrial fibrillation with rapid response: Rate is now controlled but patient is hypotensive. Discuss with cardiology. Patient will be kept on telemetry and observation. Avoid medications that don't know her blood pressure. Monitor patient's right. If she becomes tachycardic again we will treat symptomatically. Cardiology will follow and adjust medications. Patient already anticoagulated despite recent GI bleed.  #2 diabetes: Blood sugar is reasonably elevated. Resume home regimen of 7030 insulin. Add sliding scale insulin  #3 chronic kidney disease stage III: Patient appears to be at her baseline. Continue to monitor  #4 sinus node dysfunction: Well known to cardiology. Continue care according to cardiology.  #5 hyperkalemia: Avoid potassium supplementation. Recheck potassium level and if elevated will try Kayexalate   DVT prophylaxis: Eliquis   Code Status: Full Code   Family Communication: None at bedside   Disposition Plan: Home when ready   Consults called: Cardiology, Dr Theresa Duty   Admission status: Inpatient   Severity of Illness: The appropriate patient status for this patient is INPATIENT. Inpatient status is judged to be reasonable and necessary in order to provide the required intensity of service to ensure the patient's safety. The patient's presenting symptoms, physical exam findings, and initial radiographic and laboratory data in the context of their chronic comorbidities is felt to place them at high risk for further clinical deterioration. Furthermore, it is not anticipated that  the patient will be medically stable for discharge from the hospital within 2 midnights of admission. The following factors support the patient status of inpatient.   " The patient's presenting symptoms include Palpitations and Shortness of breath. " The worrisome physical exam findings include Irregularly irregular rythm. " The initial radiographic and laboratory data are worrisome because of Hypotension and lactic acidosis. " The chronic co-morbidities include Paroxysmal Afib..   * I certify that at the point of admission it is my clinical judgment that the patient will require inpatient hospital care spanning beyond 2 midnights from the point of admission due to high intensity of service, high risk for further deterioration and high frequency of surveillance required.Barbette Merino MD [facility] Pager [phone]  If 7PM-7AM, please contact night-coverage [email or web address] Password [facility]  02/04/2018, 10:34 PM

## 2018-02-04 NOTE — ED Provider Notes (Signed)
[facility] DEPART[facility] Provider Note   CSN: [id number] Arrival date & time: 02/04/18  0945     History   Chief Complaint Chief Complaint  Patient presents with  . Atrial Fibrillation  . Chest Pain    HPI LINNAE RASOOL is a 77 y.o. female.  HPI KRYSTENA REITTER is a 77 y.o. female with hx of afib, CHF, CKD, HTN, DM, TIA, presents to ED with complaint of palpitations and chest pressure. Pt states she started feeling dizzy and lightheaded yesterday after lunch. States had palpitations, states feels similar to prior afib. Was told by PCP to take half of metoprolol which she did but it did not help.  Patient does not take metoprolol regularly due to hypotension.  She states she has also had chest tightness since yesterday as well.  She reports associated shortness of breath.  Denies any swelling in extremities.  Denies any fever chills or cough.  States she is normally in sinus rhythm and can feel whenever she goes into A. fib.  She is followed by [name] with cardiology.  States that she feels dizzy like things are spinning around, worse when she is getting up.  She states she is unable to walk due to dizziness.  Past Medical History:  Diagnosis Date  . A-fib ([place]) 2016  . Acid reflux   . Anxiety   . Bowel obstruction ([place])   . CHF (congestive heart failure) ([place]) 2017  . Chronic kidney disease (CKD) stage G3a/A1, moderately decreased glomerular filtration rate (GFR) between 45-59 mL/min/1.73 square meter and albuminuria creatinine ratio less than 30 mg/g (HCC)   . Diabetes mellitus without complication ([place])   . Dyspnea   . Fatty liver   . GI bleed   . Gout   . Hypertension   . Morbid obesity ([place])   . Osteoarthritis   . Osteoporosis   . Paroxysmal atrial fibrillation (HCC)   . Presence of permanent cardiac pacemaker   . Sinus node dysfunction ([place]) 08/24/2017  . TIA (transient ischemic attack)     Patient Active Problem List   Diagnosis  Date Noted  . Atrial flutter ([place]) 08/24/2017  . Sinus node dysfunction ([place]) 08/24/2017  . Dizziness 06/12/2017  . Mastalgia 02/14/2017  . Chronic kidney disease, stage 3, mod decreased GFR (HCC) 01/09/2017  . Fatty liver 01/09/2017  . Benign essential HTN 09/08/2016  . Nephrolithiasis 02/22/2016  . Diabetic neuropathy with neurologic complication ([place]) 00/76/2263  . Moderate episode of recurrent major depressive disorder ([place]) 08/11/2015  . Gastritis without bleeding   . Gastric polyp   . Gastro-esophageal reflux disease without esophagitis   . Atrial fibrillation ([place]) 06/29/2015  . Intestinal obstruction ([place]) 02/20/2015  . DDD (degenerative disc disease), lumbar 01/19/2015  . Back pain 01/12/2015  . Gout 01/12/2015  . Complete tear of left rotator cuff 10/16/2014  . Intervertebral disc disorder with radiculopathy of lumbar region 09/17/2014  . Lumbar radiculitis 09/17/2014  . Lumbar stenosis with neurogenic claudication 09/17/2014  . Neuritis or radiculitis due to rupture of lumbar intervertebral disc 09/17/2014  . Atherosclerosis of both carotid arteries 07/15/2014  . Carotid atherosclerosis 07/15/2014  . Essential hypertension 07/15/2014  . Hyperlipemia, mixed 07/15/2014  . 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 Body mass index (BMI) of 40.0-44.9 in adult ([place]) 06/21/2014  . Mixed incontinence 06/25/2013  . Mixed urge and stress incontinence 06/25/2013  . Nocturia 06/25/2013  . Blurring of visual image 04/11/2012    Past Surgical History:  Procedure Laterality Date  . ABDOMINAL HYSTERECTOMY  1980  . APPENDECTOMY    . BACK SURGERY     2008  . BREAST BIOPSY Right 08/16   stereo fibroadenomatous change, neg for atypia  . BREAST EXCISIONAL BIOPSY Left    neg  . CARDIAC CATHETERIZATION Left 08/25/2016   Procedure: Left Heart Cath and Coronary Angiography;  Surgeon: Corey Skains, MD;  Location: [facility];  Service: Cardiovascular;  Laterality: Left;    . CARDIOVERSION N/A 06/28/2017   Procedure: CARDIOVERSION;  Surgeon: Corey Skains, MD;  Location: [facility];  Service: Cardiovascular;  Laterality: N/A;  . CATARACT EXTRACTION, BILATERAL  2012  . CHOLECYSTECTOMY    . colon blockage  1999  . COLON SURGERY  1999  . COLONOSCOPY  2016   polyps removed 2016  . DORSAL COMPARTMENT RELEASE Left 09/14/2016   Procedure: RELEASE DORSAL COMPARTMENT (DEQUERVAIN);  Surgeon: Dereck Leep, MD;  Location: [facility];  Service: Orthopedics;  Laterality: Left;  . ESOPHAGOGASTRODUODENOSCOPY N/A 01/27/2015   Procedure: ESOPHAGOGASTRODUODENOSCOPY (EGD);  Surgeon: Lollie Sails, MD;  Location[facility];  Service: Endoscopy;  Laterality: N/A;  . ESOPHAGOGASTRODUODENOSCOPY (EGD) WITH PROPOFOL N/A 07/24/2015   Procedure: ESOPHAGOGASTRODUODENOSCOPY (EGD) WITH PROPOFOL;  Surgeon: Lucilla Lame, MD;  Location: [facility] ENDOSCOPY;  Service: Endoscopy;  Laterality: N/A;  . JOINT REPLACEMENT  2014   Bilateral Knee replacement  . LEAD REVISION/REPAIR N/A 08/29/2017   Procedure: LEAD REVISION/REPAIR;  Surgeon: Constance Haw, MD;  Location: [facility];  Service: Cardiovascular;  Laterality: N/A;  . PACEMAKER INSERTION Left 07/01/2015   Procedure: INSERTION PACEMAKER;  Surgeon: Isaias Cowman, MD;  Location: [facility];  Service: Cardiovascular;  Laterality: Left;  . PACEMAKER REVISION N/A 08/28/2017   Procedure: PACEMAKER REVISION;  Surgeon: Deboraha Sprang, MD;  Location: [facility];  Service: Cardiovascular;  Laterality: N/A;  . REPLACEMENT TOTAL KNEE BILATERAL    . SHOULDER ARTHROSCOPY WITH SUBACROMIAL DECOMPRESSION Left 2013  . TEE WITHOUT CARDIOVERSION N/A 06/28/2017   Procedure: TRANSESOPHAGEAL ECHOCARDIOGRAM (TEE);  Surgeon: Corey Skains, MD;  Location: [facility];  Service: Cardiovascular;  Laterality: N/A;     OB History    Gravida  2   Para  1   Term  1   Preterm      AB  1   Living        SAB      TAB       Ectopic      Multiple      Live Births  1        Obstetric Comments  1st Menstrual Cycle: 12 1st Pregnancy:  20          Home Medications    Prior to Admission medications   Medication Sig Start Date End Date Taking? Authorizing Provider  acetaminophen (TYLENOL) 500 MG tablet Take 1,000 mg by mouth every 6 (six) hours as needed for mild pain or moderate pain.     [provider]  allopurinol (ZYLOPRIM) 100 MG tablet Take 150 mg by mouth daily.     [provider]  apixaban (ELIQUIS) 5 MG TABS tablet Take 1 tablet (5 mg total) by mouth 2 (two) times daily. 10/25/17   Deboraha Sprang, MD  busPIRone (BUSPAR) 10 MG tablet Take 10 mg by mouth 2 (two) times daily.    [provider]  cholecalciferol (VITAMIN D) 1000 units tablet Take 1,000 Units by mouth daily.    [provider]  colchicine 0.6 MG tablet Take 0.6 mg by mouth daily as needed (gout).     [provider]  docusate sodium (COLACE) 100 MG capsule Take 100 mg by mouth at bedtime.     [provider]  HYDROcodone-acetaminophen (NORCO/VICODIN) 5-325 MG tablet Take 1 tablet by mouth every 6 (six) hours as needed for moderate pain. 08/30/17 08/30/18  Baldwin Jamaica, PA-C  hydrocortisone 2.5 % cream Apply 1 application topically daily as needed (rash).    [provider]  insulin NPH-regular Human (NOVOLIN 70/30) (70-30) 100 UNIT/ML injection Inject 22 Units into the skin 2 (two) times daily before a meal.     [provider]  ketoconazole (NIZORAL) 2 % cream Apply 1 application topically every 2 (two) hours as needed (for rash under breasts).     [provider]  metoprolol tartrate (LOPRESSOR) 25 MG tablet Take 12.5 mg by mouth 2 (two) times daily.    [provider]  ondansetron (ZOFRAN ODT) 4 MG disintegrating tablet Take 1 tablet (4 mg total) by mouth every 8 (eight) hours as needed for nausea or vomiting. 12/29/17   Darel Hong, MD    oxybutynin (DITROPAN) 5 MG tablet Take 0.5 tablets (2.5 mg total) by mouth at bedtime. Patient taking differently: Take 10 mg by mouth at bedtime.  06/13/17   Epifanio Lesches, MD  pantoprazole (PROTONIX) 40 MG tablet Take 1 tablet (40 mg total) by mouth 2 (two) times daily. 12/29/17   Darel Hong, MD  PARoxetine (PAXIL) 20 MG tablet Take 20 mg by mouth daily.    [provider]  Polyethyl Glycol-Propyl Glycol (SYSTANE OP) Apply 1 drop to eye daily as needed (dry eyes).    [provider]  pravastatin (PRAVACHOL) 20 MG tablet Take 20 mg by mouth at bedtime.     [provider]  talc (ZEASORB) powder Apply 1 application topically as needed (under the breast irritation).    [provider]  traZODone (DESYREL) 50 MG tablet Take 1 tablet (50 mg total) by mouth at bedtime. 06/13/17   Epifanio Lesches, MD    Family History Family History  Problem Relation Age of Onset  . Diabetes Mellitus II Mother   . CAD Mother   . Heart attack Mother   . Cancer Father        skin  . Breast cancer Neg Hx     Social History Social History   Tobacco Use  . Smoking status: Never Smoker  . Smokeless tobacco: Never Used  Substance Use Topics  . Alcohol use: No  . Drug use: No     Allergies   Cephalosporins; Macrolides and ketolides; Meperidine; Prednisone; Shellfish allergy; Sulfa antibiotics; Uloric [febuxostat]; Aspirin; Celecoxib; Cephalexin; Erythromycin; Hydromorphone; Iodinated diagnostic agents; Oxycodone; Atorvastatin; Codeine; Doxycycline; Tape; Valdecoxib; and Iodine   Review of Systems Review of Systems  Constitutional: Negative for chills and fever.  Respiratory: Positive for chest tightness and shortness of breath. Negative for cough.   Cardiovascular: Positive for chest pain. Negative for palpitations and leg swelling.  Gastrointestinal: Negative for abdominal pain, diarrhea, nausea and vomiting.  Genitourinary: Negative for dysuria,  flank pain, pelvic pain, vaginal bleeding, vaginal discharge and vaginal pain.  Musculoskeletal: Negative for arthralgias, myalgias, neck pain and neck stiffness.  Skin: Negative for rash.  Neurological: Positive for dizziness, weakness and light-headedness. Negative for headaches.  All other systems reviewed and are negative.    Physical Exam Updated Vital Signs BP (!) 85/66  Pulse (!) 131   Temp 97.6 F (36.4 C) (Oral)   Resp 19   Ht 5' 4.5" (1.638 m)   Wt 112.9 kg (249 lb)   SpO2 100%   BMI 42.08 kg/m   Physical Exam  Constitutional: She appears well-developed and well-nourished. No distress.  HENT:  Head: Normocephalic.  Eyes: Conjunctivae are normal.  Neck: Neck supple.  Cardiovascular: Normal rate and normal heart sounds.  Irregular rhythm  Pulmonary/Chest: Effort normal and breath sounds normal. No respiratory distress. She has no wheezes. She has no rales.  Abdominal: Soft. Bowel sounds are normal. She exhibits no distension. There is no tenderness. There is no rebound.  Musculoskeletal: She exhibits no edema.  Neurological: She is alert.  Skin: Skin is warm and dry.  Psychiatric: She has a normal mood and affect. Her behavior is normal.  Nursing note and vitals reviewed.    ED Treatments / Results  Labs (all labs ordered are listed, but only abnormal results are displayed) Labs Reviewed  BASIC METABOLIC PANEL - Abnormal; Notable for the following components:      Result Value   Sodium 134 (*)    Potassium 5.5 (*)    CO2 20 (*)    Glucose, Bld 301 (*)    BUN 22 (*)    Creatinine, Ser 1.45 (*)    GFR calc non Af Amer 34 (*)    GFR calc Af Amer 39 (*)    All other components within normal limits  I-STAT CG4 LACTIC ACID, ED - Abnormal; Notable for the following components:   Lactic Acid, Venous 2.27 (*)    All other components within normal limits  CBC  I-STAT TROPONIN, ED  I-STAT CG4 LACTIC ACID, ED  I-STAT TROPONIN, ED  I-STAT CG4 LACTIC ACID, ED     EKG EKG Interpretation  Date/Time:  Sunday Feb 04 2018 11:02:31 EDT Ventricular Rate:  126 PR Interval:    QRS Duration: 110 QT Interval:  290 QTC Calculation: 420 R Axis:   62 Text Interpretation:  Sinus tachycardia Consider right atrial enlargement Inferior infarct, acute (RCA) Probable anteroseptal infarct, old Lateral leads are also involved Probable RV involvement, suggest recording right precordial leads No significant change since last tracing Confirmed by Orlie Dakin [phone]) on 02/04/2018 11:58:54 AM   Radiology Dg Chest Port 1 View  Result Date: 02/04/2018 CLINICAL DATA:  Atrial fibrillation. EXAM: PORTABLE CHEST 1 VIEW COMPARISON:  12/29/2017 FINDINGS: There is a left chest wall pacer device with leads in the right atrial appendage and right ventricle. There is aortic atherosclerotic calcifications noted. No pleural effusion or edema. No airspace opacities. IMPRESSION: 1. No active disease. 2.  Aortic Atherosclerosis (ICD10-I70.0). Electronically Signed   By: Kerby Moors M.D.   On: 02/04/2018 10:51    Procedures Procedures (including critical care time)  Medications Ordered in ED Medications  sodium chloride 0.9 % bolus 1,000 mL (has no administration in time range)  gi cocktail (Maalox,Lidocaine,Donnatal) (has no administration in time range)  aspirin chewable tablet 324 mg (has no administration in time range)     Initial Impression / Assessment and Plan / ED Course  I have reviewed the triage vital signs and the nursing notes.  Pertinent labs & imaging results that were available during my care of the patient were reviewed by me and considered in my medical decision making (see chart for details).     Patient in emergency department in A. fib, with chest pressure, dizziness, palpitations.  Initial triage vital  signs show heart rate of 130.  On the monitor this time, heart rate is in the 90s.  Blood pressure however is low at 85/66.  I will give her IV  fluid bolus.  Will keep close eye on blood pressures.  Patient is complaining of severe chest tightness, states that she feels like it is acid reflux.  I will try GI cocktail and give aspirin for possible anginal pain.  Will get EKG, troponin, labs.  Chest x-ray pending as well.  CXR negaive. Pt appears to be in aflutter with HR in 130-120. Pt having severe pain in chest, she is moaning and crying. She received asa and gi cocktail. Will give morphine. Will get cardiology to come see.   Still tachycardic.  Discussed with Dr. Cathleen Fears who has seen patient as well, will start on Cardizem drip.  Will give fentanyl and Ativan, patient is tearful, crying screaming and crying in the room in pain.  I did check blood pressures in both arms, they are normal and equal.  She has bilateral equal pulses in her extremities, I do not think she is dissecting.  We will try to slow the heart rate and see if she is feeling better. Trop negative.    1:11 PM Spoke with cradiology will come see. Pt's hr finally improved on cardizem, still appears to be in aflutter. Pain someone improved as well.  Pt is more calm at this time.   Vitals:   02/04/18 1515 02/04/18 1530 02/04/18 1545 02/04/18 1633  BP: 103/66 90/63 (!) 114/57 121/66  Pulse: (!) 118 (!) 116 (!) 101   Resp: (!) 8 16 16    Temp:      TempSrc:      SpO2: 99% 99% 99%   Weight:      Height:       4:37 PM Cardiology saw pt, asked to get verapramil and take off cardizem. See if can be rate controlled. If rate controlled, ok to dc. otherwise pt will need admission.   Final Clinical Impressions(s) / ED Diagnoses   Final diagnoses:  Atrial flutter, unspecified type [facility])  Atypical chest pain    ED Discharge Orders    None       Jeannett Senior, PA-C 02/04/18 [place], MD 02/04/18 1744

## 2018-02-04 NOTE — ED Notes (Signed)
Pt states that she took all of her night meds, as recorded here, from her own supplies. Measured her CBG by her own equipment, reported reading 147.

## 2018-02-04 NOTE — Consult Note (Signed)
Cardiology Consultation:   Patient ID: Deborah Obrien; [id number]; 07/17/1941   Admit date: 02/04/2018 Date of Consult: 02/04/2018  Primary Care Provider: [name], MD Primary Cardiologist: Gaynelle Cage Electrophysiologist:  Caryl Comes   Patient Profile:   Deborah Obrien is a 77 y.o. female with a hx of sinus node dysfunction and PAF who is being seen today for the evaluation of atrial fib/flutter with an RVR at the request of Dr. Winfred Leeds.  History of Present Illness:   Deborah Obrien has a h/o symptomatic bradycardia with initial single chamber PPM who underwent upgrade to a DDD PM several months ago. She was unable to tolerate amio and metoprolol due to hypotension. The patient presented a month ago with a GI bleed. She still has epigastric pain. Yesterday she began to experience sob and sscp and presented this morning with atrial fib/flutter with a RVR at 130/min. She was treated with MSO4 and IV cardizem. Her HR's are much improved and her breathing is back to normal. She denies missing any of her anti-coagulants.  Past Medical History:  Diagnosis Date  . A-fib ([place]) 2016  . Acid reflux   . Anxiety   . Bowel obstruction ([place])   . CHF (congestive heart failure) ([place]) 2017  . Chronic kidney disease (CKD) stage G3a/A1, moderately decreased glomerular filtration rate (GFR) between 45-59 mL/min/1.73 square meter and albuminuria creatinine ratio less than 30 mg/g (HCC)   . Diabetes mellitus without complication ([place])   . Dyspnea   . Fatty liver   . GI bleed   . Gout   . Hypertension   . Morbid obesity ([place])   . Osteoarthritis   . Osteoporosis   . Paroxysmal atrial fibrillation (HCC)   . Presence of permanent cardiac pacemaker   . Sinus node dysfunction ([place]) 08/24/2017  . TIA (transient ischemic attack)     Past Surgical History:  Procedure Laterality Date  . ABDOMINAL HYSTERECTOMY  1980  . APPENDECTOMY    . BACK SURGERY     2008  . BREAST BIOPSY Right  08/16   stereo fibroadenomatous change, neg for atypia  . BREAST EXCISIONAL BIOPSY Left    neg  . CARDIAC CATHETERIZATION Left 08/25/2016   Procedure: Left Heart Cath and Coronary Angiography;  Surgeon: Corey Skains, MD;  Location: [place] [facility];  Service: Cardiovascular;  Laterality: Left;  . CARDIOVERSION N/A 06/28/2017   Procedure: CARDIOVERSION;  Surgeon: Corey Skains, MD;  Location: [facility];  Service: Cardiovascular;  Laterality: N/A;  . CATARACT EXTRACTION, BILATERAL  2012  . CHOLECYSTECTOMY    . colon blockage  1999  . COLON SURGERY  1999  . COLONOSCOPY  2016   polyps removed 2016  . DORSAL COMPARTMENT RELEASE Left 09/14/2016   Procedure: RELEASE DORSAL COMPARTMENT (DEQUERVAIN);  Surgeon: Dereck Leep, MD;  Location: [facility];  Service: Orthopedics;  Laterality: Left;  . ESOPHAGOGASTRODUODENOSCOPY N/A 01/27/2015   Procedure: ESOPHAGOGASTRODUODENOSCOPY (EGD);  Surgeon: Lollie Sails, MD;  Location: [facility] ENDOSCOPY;  Service: Endoscopy;  Laterality: N/A;  . ESOPHAGOGASTRODUODENOSCOPY (EGD) WITH PROPOFOL N/A 07/24/2015   Procedure: ESOPHAGOGASTRODUODENOSCOPY (EGD) WITH PROPOFOL;  Surgeon: Lucilla Lame, MD;  Location: [facility] ENDOSCOPY;  Service: Endoscopy;  Laterality: N/A;  . JOINT REPLACEMENT  2014   Bilateral Knee replacement  . LEAD REVISION/REPAIR N/A 08/29/2017   Procedure: LEAD REVISION/REPAIR;  Surgeon: Constance Haw, MD;  Location: [place] [facility];  Service: Cardiovascular;  Laterality: N/A;  . PACEMAKER INSERTION Left 07/01/2015   Procedure:  INSERTION PACEMAKER;  Surgeon: Isaias Cowman, MD;  Location: [facility];  Service: Cardiovascular;  Laterality: Left;  . PACEMAKER REVISION N/A 08/28/2017   Procedure: PACEMAKER REVISION;  Surgeon: Deboraha Sprang, MD;  Location: [place] [facility];  Service: Cardiovascular;  Laterality: N/A;  . REPLACEMENT TOTAL KNEE BILATERAL    . SHOULDER ARTHROSCOPY WITH SUBACROMIAL DECOMPRESSION Left 2013  . TEE  WITHOUT CARDIOVERSION N/A 06/28/2017   Procedure: TRANSESOPHAGEAL ECHOCARDIOGRAM (TEE);  Surgeon: Corey Skains, MD;  Location: [facility] OR[facility];  Service: Cardiovascular;  Laterality: N/A;     Home Medications:  Prior to Admission medications   Medication Sig Start Date End Date Taking? Authorizing Provider  acetaminophen (TYLENOL) 500 MG tablet Take 1,000 mg by mouth every 6 (six) hours as needed for mild pain or moderate pain.     [provider]  allopurinol (ZYLOPRIM) 100 MG tablet Take 150 mg by mouth daily.     [provider]  apixaban (ELIQUIS) 5 MG TABS tablet Take 1 tablet (5 mg total) by mouth 2 (two) times daily. 10/25/17   Deboraha Sprang, MD  busPIRone (BUSPAR) 10 MG tablet Take 10 mg by mouth 2 (two) times daily.    [provider]  cholecalciferol (VITAMIN D) 1000 units tablet Take 1,000 Units by mouth daily.    [provider]  colchicine 0.6 MG tablet Take 0.6 mg by mouth daily as needed (gout).     [provider]  docusate sodium (COLACE) 100 MG capsule Take 100 mg by mouth at bedtime.     [provider]  HYDROcodone-acetaminophen (NORCO/VICODIN) 5-325 MG tablet Take 1 tablet by mouth every 6 (six) hours as needed for moderate pain. 08/30/17 08/30/18  Baldwin Jamaica, PA-C  hydrocortisone 2.5 % cream Apply 1 application topically daily as needed (rash).    [provider]  insulin NPH-regular Human (NOVOLIN 70/30) (70-30) 100 UNIT/ML injection Inject 22 Units into the skin 2 (two) times daily before a meal.     [provider]  ketoconazole (NIZORAL) 2 % cream Apply 1 application topically every 2 (two) hours as needed (for rash under breasts).     [provider]  metoprolol tartrate (LOPRESSOR) 25 MG tablet Take 12.5 mg by mouth 2 (two) times daily.    [provider]  ondansetron (ZOFRAN ODT) 4 MG disintegrating tablet Take 1 tablet (4 mg total) by mouth every 8 (eight) hours as needed  for nausea or vomiting. 12/29/17   Darel Hong, MD  oxybutynin (DITROPAN) 5 MG tablet Take 0.5 tablets (2.5 mg total) by mouth at bedtime. Patient taking differently: Take 10 mg by mouth at bedtime.  06/13/17   Epifanio Lesches, MD  pantoprazole (PROTONIX) 40 MG tablet Take 1 tablet (40 mg total) by mouth 2 (two) times daily. 12/29/17   Darel Hong, MD  PARoxetine (PAXIL) 20 MG tablet Take 20 mg by mouth daily.    [provider]  Polyethyl Glycol-Propyl Glycol (SYSTANE OP) Apply 1 drop to eye daily as needed (dry eyes).    [provider]  pravastatin (PRAVACHOL) 20 MG tablet Take 20 mg by mouth at bedtime.     [provider]  talc (ZEASORB) powder Apply 1 application topically as needed (under the breast irritation).    [provider]  traZODone (DESYREL) 50 MG tablet Take 1 tablet (50 mg total) by mouth at bedtime. 06/13/17   Epifanio Lesches, MD    Inpatient Medications: Scheduled Meds: . metoprolol tartrate  5 mg Intravenous Once   Continuous Infusions:  PRN Meds:   Allergies:    Allergies  Allergen Reactions  . Cephalosporins Other (See Comments)    Reaction:  Unknown   . Macrolides And Ketolides Other (See Comments)    Reaction:  Unknown   . Meperidine Shortness Of Breath, Nausea Only and Other (See Comments)    Reaction:  Stomach pain   . Prednisone Anaphylaxis and Other (See Comments)    Reaction:  Increases pts blood sugar  Pt states that she is allergic to all steroids.    . Shellfish Allergy Anaphylaxis, Shortness Of Breath, Diarrhea, Nausea Only and Rash    Reaction:  Stomach pain   . Sulfa Antibiotics Shortness Of Breath, Diarrhea, Nausea Only and Other (See Comments)    Reaction:  Stomach pain   . Uloric [Febuxostat] Anaphylaxis    Locks pt's body up   . Aspirin Other (See Comments)    Reaction:  Stomach pain   . Celecoxib Other (See Comments)    Reaction:  GI bleed, weakness, and stomach pain.    . Cephalexin  Diarrhea, Nausea Only and Other (See Comments)    Reaction:  Stomach pain   . Erythromycin Diarrhea, Nausea Only and Other (See Comments)    Reaction:  Stomach pain   . Hydromorphone Other (See Comments)    Reaction:  Hypotension   . Iodinated Diagnostic Agents Rash and Other (See Comments)    Pt states that she is unable to have because she has chronic kidney disease.    Marland Kitchen Oxycodone Other (See Comments)    Reaction:  Stomach pain   . Atorvastatin Nausea Only and Other (See Comments)    Reaction:  Weakness   . Codeine Diarrhea, Nausea Only and Other (See Comments)    Reaction:  Stomach pain Pt tolerates morphine   . Doxycycline     Stomach pain   . Tape Other (See Comments)    Reaction:  Causes pts skin to tear  Pt states that she is able to use paper tape.    . Valdecoxib Swelling and Other (See Comments)    Pt states that her hands and feet swell.    . Iodine Rash    Social History:   Social History   Socioeconomic History  . Marital status: Married    Spouse name: Not on file  . Number of children: Not on file  . Years of education: Not on file  . Highest education level: Not on file  Occupational History  . Occupation: retired  Scientific laboratory technician  . Financial resource strain: Not on file  . Food insecurity:    Worry: Not on file    Inability: Not on file  . Transportation needs:    Medical: Not on file    Non-medical: Not on file  Tobacco Use  . Smoking status: Never Smoker  . Smokeless tobacco: Never Used  Substance and Sexual Activity  . Alcohol use: No  . Drug use: No  . Sexual activity: Yes  Lifestyle  . Physical activity:    Days per week: Not on file    Minutes per session: Not on file  . Stress: Not on file  Relationships  . Social connections:    Talks on phone: Not on file    Gets together: Not on file    Attends religious service: Not on file    Active member of club or organization: Not on file    Attends meetings of  clubs or organizations: Not  on file    Relationship status: Not on file  . Intimate partner violence:    Fear of current or ex partner: Not on file    Emotionally abused: Not on file    Physically abused: Not on file    Forced sexual activity: Not on file  Other Topics Concern  . Not on file  Social History Narrative  . Not on file    Family History:    Family History  Problem Relation Age of Onset  . Diabetes Mellitus II Mother   . CAD Mother   . Heart attack Mother   . Cancer Father        skin  . Breast cancer Neg Hx      ROS:  Please see the history of present illness.   All other ROS reviewed and negative.     Physical Exam/Data:   Vitals:   02/04/18 1048 02/04/18 1115 02/04/18 1230 02/04/18 1233  BP: 118/88 113/63 117/63 (!) 111/58  Pulse: (!) 125 (!) 125    Resp:  (!) 26    Temp:      TempSrc:      SpO2: 100% 100%    Weight:      Height:       No intake or output data in the 24 hours ending 02/04/18 1328 Filed Weights   02/04/18 0957  Weight: 249 lb (112.9 kg)   Body mass index is 42.08 kg/m.  General:  Well nourished, well developed, in no acute distress HEENT: normal Lymph: no adenopathy Neck: 6 cm JVD Endocrine:  No thryomegaly Vascular: No carotid bruits; FA pulses 2+ bilaterally without bruits  Cardiac:  normal S1, S2; IRRR; no murmur  Lungs:  clear to auscultation bilaterally, no wheezing, rhonchi or rales  Abd: soft, nontender, no hepatomegaly  Ext: no edema Musculoskeletal:  No deformities, BUE and BLE strength normal and equal Skin: warm and dry  Neuro:  CNs 2-12 intact, no focal abnormalities noted Psych:  Normal affect   EKG:  The EKG was personally reviewed and demonstrates:  Atrial flutter with a RVR Telemetry:  Telemetry was personally reviewed and demonstrates:  Atrial fib/flutter with a controlled VR  Relevant CV Studies: none  Laboratory Data:  Chemistry Recent Labs  Lab 02/04/18 1048  NA 134*  K 5.5*  CL 102  CO2 20*  GLUCOSE 301*  BUN 22*   CREATININE 1.45*  CALCIUM 9.1  GFRNONAA 34*  GFRAA 39*  ANIONGAP 12    No results for input(s): PROT, ALBUMIN, AST, ALT, ALKPHOS, BILITOT in the last 168 hours. Hematology Recent Labs  Lab 02/04/18 1048  WBC 5.0  RBC 4.54  HGB 14.4  HCT 43.0  MCV 94.7  MCH 31.7  MCHC 33.5  RDW 11.7  PLT 213   Cardiac EnzymesNo results for input(s): TROPONINI in the last 168 hours.  Recent Labs  Lab 02/04/18 1111  TROPIPOC 0.02    BNPNo results for input(s): BNP, PROBNP in the last 168 hours.  DDimer No results for input(s): DDIMER in the last 168 hours.  Radiology/Studies:  Dg Chest Port 1 View  Result Date: 02/04/2018 CLINICAL DATA:  Atrial fibrillation. EXAM: PORTABLE CHEST 1 VIEW COMPARISON:  12/29/2017 FINDINGS: There is a left chest wall pacer device with leads in the right atrial appendage and right ventricle. There is aortic atherosclerotic calcifications noted. No pleural effusion or edema. No airspace opacities. IMPRESSION: 1. No active disease. 2.  Aortic Atherosclerosis (ICD10-I70.0). Electronically  Signed   By: Kerby Moors M.D.   On: 02/04/2018 10:51    Assessment and Plan:   1. Recurrent atrial fib/flutter with an RVR - her rates are now controlled. She is on IV cardizem. I will ask her to take verapamil now, wean her IV diltiazem off and if her HR remains controlled, she can be discharged home on long acting verapamil. 2. Mid-epigastric pain - her symptoms are lasting for a few seconds at a time.  3. coags - she will continue systemic anti-coagulation. She has had no evidence of recurrent bleeding. 4. Disp. - if her rates remain controlled, she can be discharged from the ED. If her rates go back over 100, she can be admitted and additional rate controlling meds be tried.    For questions or updates, please contact [place] Please consult [email or web address] for contact info under Cardiology/STEMI.   Signed, Cristopher Peru, MD  02/04/2018 1:28 PM

## 2018-02-05 DIAGNOSIS — I481 Persistent atrial fibrillation: Secondary | ICD-10-CM

## 2018-02-05 LAB — CBC WITH DIFFERENTIAL/PLATELET
Abs Immature Granulocytes: 0 K/uL (ref 0.0–0.1)
Basophils Absolute: 0.1 K/uL (ref 0.0–0.1)
Basophils Relative: 1 %
Eosinophils Absolute: 0.1 K/uL (ref 0.0–0.7)
Eosinophils Relative: 1 %
HCT: 43.4 % (ref 36.0–46.0)
Hemoglobin: 13.7 g/dL (ref 12.0–15.0)
Immature Granulocytes: 0 %
Lymphocytes Relative: 46 %
Lymphs Abs: 4 K/uL (ref 0.7–4.0)
MCH: 31.4 pg (ref 26.0–34.0)
MCHC: 31.6 g/dL (ref 30.0–36.0)
MCV: 99.3 fL (ref 78.0–100.0)
Monocytes Absolute: 0.9 K/uL (ref 0.1–1.0)
Monocytes Relative: 11 %
Neutro Abs: 3.5 K/uL (ref 1.7–7.7)
Neutrophils Relative %: 41 %
Platelets: 210 K/uL (ref 150–400)
RBC: 4.37 MIL/uL (ref 3.87–5.11)
RDW: 12.2 % (ref 11.5–15.5)
WBC: 8.6 K/uL (ref 4.0–10.5)

## 2018-02-05 LAB — CBG MONITORING, ED
GLUCOSE-CAPILLARY: 238 mg/dL — AB (ref 65–99)
Glucose-Capillary: 29 mg/dL — CL (ref 65–99)
Glucose-Capillary: 264 mg/dL — ABNORMAL HIGH (ref 65–99)

## 2018-02-05 LAB — HEMOGLOBIN A1C
HEMOGLOBIN A1C: 6.2 % — AB (ref 4.8–5.6)
Mean Plasma Glucose: 131.24 mg/dL

## 2018-02-05 LAB — COMPREHENSIVE METABOLIC PANEL WITH GFR
ALT: 45 U/L (ref 14–54)
AST: 71 U/L — ABNORMAL HIGH (ref 15–41)
Albumin: 3.5 g/dL (ref 3.5–5.0)
Alkaline Phosphatase: 133 U/L — ABNORMAL HIGH (ref 38–126)
Anion gap: 9 (ref 5–15)
BUN: 27 mg/dL — ABNORMAL HIGH (ref 6–20)
CO2: 23 mmol/L (ref 22–32)
Calcium: 8.6 mg/dL — ABNORMAL LOW (ref 8.9–10.3)
Chloride: 108 mmol/L (ref 101–111)
Creatinine, Ser: 1.79 mg/dL — ABNORMAL HIGH (ref 0.44–1.00)
GFR calc Af Amer: 30 mL/min — ABNORMAL LOW (ref >60)
GFR calc non Af Amer: 26 mL/min — ABNORMAL LOW (ref >60)
Glucose, Bld: 35 mg/dL — CL (ref 65–99)
Potassium: 5.5 mmol/L — ABNORMAL HIGH (ref 3.5–5.1)
Sodium: 140 mmol/L (ref 135–145)
Total Bilirubin: 1 mg/dL (ref 0.3–1.2)
Total Protein: 6.2 g/dL — ABNORMAL LOW (ref 6.5–8.1)

## 2018-02-05 LAB — BASIC METABOLIC PANEL
Anion gap: 7 (ref 5–15)
BUN: 25 mg/dL — AB (ref 6–20)
CHLORIDE: 105 mmol/L (ref 101–111)
CO2: 23 mmol/L (ref 22–32)
CREATININE: 1.57 mg/dL — AB (ref 0.44–1.00)
Calcium: 8.2 mg/dL — ABNORMAL LOW (ref 8.9–10.3)
GFR calc Af Amer: 36 mL/min — ABNORMAL LOW (ref >60)
GFR calc non Af Amer: 31 mL/min — ABNORMAL LOW (ref >60)
GLUCOSE: 289 mg/dL — AB (ref 65–99)
Potassium: 5.8 mmol/L — ABNORMAL HIGH (ref 3.5–5.1)
SODIUM: 135 mmol/L (ref 135–145)

## 2018-02-05 LAB — GLUCOSE, CAPILLARY
GLUCOSE-CAPILLARY: 324 mg/dL — AB (ref 65–99)
GLUCOSE-CAPILLARY: 101 mg/dL — AB (ref 65–99)
Glucose-Capillary: 316 mg/dL — ABNORMAL HIGH (ref 65–99)
Glucose-Capillary: 232 mg/dL — ABNORMAL HIGH (ref 65–99)

## 2018-02-05 LAB — TSH: TSH: 2.948 u[IU]/mL (ref 0.350–4.500)

## 2018-02-05 LAB — PROTIME-INR
INR: 1.34
PROTHROMBIN TIME: 16.5 s — AB (ref 11.4–15.2)

## 2018-02-05 LAB — I-STAT CG4 LACTIC ACID, ED: Lactic Acid, Venous: 0.71 mmol/L (ref 0.5–1.9)

## 2018-02-05 MED ORDER — COLCHICINE 0.6 MG PO TABS: 0.6000 mg | ORAL_TABLET | Freq: Every day | ORAL | Status: DC | PRN

## 2018-02-05 MED ORDER — DEXTROSE 50 % IV SOLN
2.0000 | Freq: Once | INTRAVENOUS | Status: AC
  Administered 2018-02-05: 100 mL via INTRAVENOUS

## 2018-02-05 MED ORDER — INSULIN ASPART 100 UNIT/ML ~~LOC~~ SOLN: 0.0000 [IU] | Freq: Every day | SUBCUTANEOUS | Status: DC

## 2018-02-05 MED ORDER — SODIUM CHLORIDE 0.9% FLUSH
3.0000 mL | Freq: Two times a day (BID) | INTRAVENOUS | Status: DC
  Administered 2018-02-05 (×2): 3 mL via INTRAVENOUS

## 2018-02-05 MED ORDER — KETOCONAZOLE 2 % EX CREA
1.0000 | TOPICAL_CREAM | CUTANEOUS | Status: DC | PRN
Start: 2018-02-05 — End: 2018-02-06

## 2018-02-05 MED ORDER — INSULIN ASPART PROT & ASPART (70-30 MIX) 100 UNIT/ML ~~LOC~~ SUSP
25.0000 [IU] | Freq: Two times a day (BID) | SUBCUTANEOUS | Status: DC
  Administered 2018-02-05 – 2018-02-06 (×2): 25 [IU] via SUBCUTANEOUS
  Filled 2018-02-05: qty 10

## 2018-02-05 MED ORDER — SODIUM POLYSTYRENE SULFONATE 15 GM/60ML PO SUSP
30.0000 g | Freq: Once | ORAL | Status: AC
  Administered 2018-02-05: 30 g via ORAL
  Filled 2018-02-05: qty 120

## 2018-02-05 MED ORDER — ALLOPURINOL 300 MG PO TABS
150.0000 mg | ORAL_TABLET | Freq: Every day | ORAL | Status: DC
  Administered 2018-02-06: 150 mg via ORAL
  Filled 2018-02-05: qty 0.5
  Filled 2018-02-05: qty 1

## 2018-02-05 MED ORDER — SODIUM CHLORIDE 0.9 % IV SOLN
250.0000 mL | INTRAVENOUS | Status: DC
  Administered 2018-02-05: 250 mL via INTRAVENOUS

## 2018-02-05 MED ORDER — SODIUM CHLORIDE 0.9% FLUSH: 3.0000 mL | INTRAVENOUS | Status: DC | PRN

## 2018-02-05 MED ORDER — AMOXICILLIN-POT CLAVULANATE 875-125 MG PO TABS
1.0000 | ORAL_TABLET | Freq: Two times a day (BID) | ORAL | Status: DC
  Administered 2018-02-05 – 2018-02-06 (×3): 1 via ORAL
  Filled 2018-02-05 (×3): qty 1

## 2018-02-05 MED ORDER — PAROXETINE HCL 20 MG PO TABS
20.0000 mg | ORAL_TABLET | Freq: Every day | ORAL | Status: DC
  Administered 2018-02-06: 20 mg via ORAL
  Filled 2018-02-05 (×2): qty 1

## 2018-02-05 MED ORDER — HYDROCORTISONE 1 % EX CREA: 1.0000 "application " | TOPICAL_CREAM | Freq: Every day | CUTANEOUS | Status: DC | PRN

## 2018-02-05 MED ORDER — INSULIN ASPART 100 UNIT/ML ~~LOC~~ SOLN
0.0000 [IU] | Freq: Three times a day (TID) | SUBCUTANEOUS | Status: DC
  Administered 2018-02-05: 3 [IU] via SUBCUTANEOUS
  Administered 2018-02-06: 5 [IU] via SUBCUTANEOUS

## 2018-02-05 MED ORDER — AMIODARONE HCL 200 MG PO TABS
400.0000 mg | ORAL_TABLET | Freq: Two times a day (BID) | ORAL | Status: DC
  Administered 2018-02-05 – 2018-02-06 (×3): 400 mg via ORAL
  Filled 2018-02-05 (×3): qty 2

## 2018-02-05 MED ORDER — POLYVINYL ALCOHOL 1.4 % OP SOLN: Freq: Every day | OPHTHALMIC | Status: DC | PRN

## 2018-02-05 MED ORDER — ACETAMINOPHEN 500 MG PO TABS
1000.0000 mg | ORAL_TABLET | Freq: Four times a day (QID) | ORAL | Status: DC | PRN
  Filled 2018-02-05: qty 2

## 2018-02-05 MED ORDER — ONDANSETRON HCL 4 MG/2ML IJ SOLN: 4.0000 mg | Freq: Four times a day (QID) | INTRAMUSCULAR | Status: DC | PRN

## 2018-02-05 MED ORDER — VERAPAMIL HCL ER 120 MG PO TBCR
120.0000 mg | EXTENDED_RELEASE_TABLET | Freq: Every day | ORAL | Status: DC
  Administered 2018-02-06: 120 mg via ORAL
  Filled 2018-02-05: qty 1

## 2018-02-05 MED ORDER — ACETAMINOPHEN 325 MG PO TABS: 650.0000 mg | ORAL_TABLET | ORAL | Status: DC | PRN

## 2018-02-05 MED ORDER — TALC EX POWD: 1.0000 "application " | CUTANEOUS | Status: DC | PRN

## 2018-02-05 MED ORDER — DEXTROSE 50 % IV SOLN
INTRAVENOUS | Status: AC
  Filled 2018-02-05: qty 100

## 2018-02-05 MED ORDER — VITAMIN D 1000 UNITS PO TABS
1000.0000 [IU] | ORAL_TABLET | Freq: Every day | ORAL | Status: DC
  Administered 2018-02-06: 1000 [IU] via ORAL
  Filled 2018-02-05: qty 1

## 2018-02-05 MED ORDER — SODIUM CHLORIDE 0.9 % IV SOLN
INTRAVENOUS | Status: DC
  Administered 2018-02-05: 01:00:00 via INTRAVENOUS

## 2018-02-05 NOTE — Progress Notes (Signed)
PROGRESS NOTE    Deborah Obrien  WUJ:[id number] DOB: 08-Dec-1940 DOA: 02/04/2018 PCP: [name], MD    Brief Narrative:  Deborah Obrien is a 77 y.o. female with medical history significant of paroxysmal atrial fibrillation with symptomatic bradycardia status post single-chamber pacemaker insertion, diastolic CHF, diabetes, hypertension and morbid obesity who presented to the ER with shortness of breath as well as palpitations.    Assessment & Plan:   Principal Problem:   Atrial fibrillation (HCC) Active Problems:   Chronic kidney disease, stage 3, mod decreased GFR (HCC)   Essential hypertension   Sinus node dysfunction (HCC)   Hypotension   A-fib (HCC)   Atrial fibrillation with RVR: Rate better controlled today. Plan for DCCV tomorrow.  Resume amiodarone for rate controlled.      Stage 3 ckd; Creatinine stable at 1.57.     Diabetes Mellitus:  CBG (last 3)  Recent Labs    02/05/18 1003 02/05/18 1138 02/05/18 1613  GLUCAP 324* 316* 232*   Resume SSI and novolog 70/30 mmhg 25 units BID.   Hypertension:  Well controlled.    Hyperkalemia: Kayexalate given. Repeat BMP in am.   Dysuria, urinary incontinence:  - urine cultures ordered and started the patient on augmentin .    GOUT; Resume colchicine and allopurinol.      DVT prophylaxis:  Code Status: full code.  Family Communication: family at bedside.  Disposition Plan: pending DCCV.   Consultants:   Cardiology.    Procedures: none.   Antimicrobials: augmentin   Subjective: No chest pain or sob.   Objective: Vitals:   02/05/18 0858 02/05/18 0900 02/05/18 1057 02/05/18 1141  BP: 102/64  104/64 (!) 94/52  Pulse:    (!) 115  Resp: 14 (!) 25 (!) 22   Temp:    99 F (37.2 C)  TempSrc:    Oral  SpO2:    100%  Weight:      Height:        Intake/Output Summary (Last 24 hours) at 02/05/2018 1653 Last data filed at 02/04/2018 2016 Gross per 24 hour  Intake -  Output  150 ml  Net -150 ml   Filed Weights   02/04/18 0957  Weight: 112.9 kg (249 lb)    Examination:  General exam: Appears calm and comfortable  Respiratory system: Clear to auscultation. Respiratory effort normal. Cardiovascular system: S1 & S2 heard. Irregular, tachycardic.  No pedal edema. Gastrointestinal system: Abdomen is soft non tender non distended bowel sounds heard.  Central nervous system: Alert and oriented. No focal neurological deficits. Extremities: Symmetric 5 x 5 power. Skin: No rashes, lesions or ulcers Psychiatry: . Mood & affect appropriate.     Data Reviewed: I have personally reviewed following labs and imaging studies  CBC: Recent Labs  Lab 02/04/18 1048 02/05/18 0146  WBC 5.0 8.6  NEUTROABS  --  3.5  HGB 14.4 13.7  HCT 43.0 43.4  MCV 94.7 99.3  PLT 213 956   Basic Metabolic Panel: Recent Labs  Lab 02/04/18 1048 02/05/18 0146 02/05/18 1307  NA 134* 140 135  K 5.5* 5.5* 5.8*  CL 102 108 105  CO2 20* 23 23  GLUCOSE 301* 35* 289*  BUN 22* 27* 25*  CREATININE 1.45* 1.79* 1.57*  CALCIUM 9.1 8.6* 8.2*   GFR: Estimated Creatinine Clearance: 37.3 mL/min (A) (by C-G formula based on SCr of 1.57 mg/dL (H)). Liver Function Tests: Recent Labs  Lab 02/05/18 0146  AST 71*  ALT 45  ALKPHOS 133*  BILITOT 1.0  PROT 6.2*  ALBUMIN 3.5   No results for input(s): LIPASE, AMYLASE in the last 168 hours. No results for input(s): AMMONIA in the last 168 hours. Coagulation Profile: Recent Labs  Lab 02/05/18 1156  INR 1.34   Cardiac Enzymes: No results for input(s): CKTOTAL, CKMB, CKMBINDEX, TROPONINI in the last 168 hours. BNP (last 3 results) No results for input(s): PROBNP in the last 8760 hours. HbA1C: Recent Labs    02/05/18 1307  HGBA1C 6.2*   CBG: Recent Labs  Lab 02/05/18 0409 02/05/18 0649 02/05/18 1003 02/05/18 1138 02/05/18 1613  GLUCAP 264* 238* 324* 316* 232*   Lipid Profile: No results for input(s): CHOL, HDL, LDLCALC,  TRIG, CHOLHDL, LDLDIRECT in the last 72 hours. Thyroid Function Tests: Recent Labs    02/05/18 0146  TSH 2.948   Anemia Panel: No results for input(s): VITAMINB12, FOLATE, FERRITIN, TIBC, IRON, RETICCTPCT in the last 72 hours. Sepsis Labs: Recent Labs  Lab 02/04/18 1113 02/04/18 2041 02/05/18 0203  LATICACIDVEN 2.27* 1.52 0.71    No results found for this or any previous visit (from the past 240 hour(s)).       Radiology Studies: Dg Chest Port 1 View  Result Date: 02/04/2018 CLINICAL DATA:  Atrial fibrillation. EXAM: PORTABLE CHEST 1 VIEW COMPARISON:  12/29/2017 FINDINGS: There is a left chest wall pacer device with leads in the right atrial appendage and right ventricle. There is aortic atherosclerotic calcifications noted. No pleural effusion or edema. No airspace opacities. IMPRESSION: 1. No active disease. 2.  Aortic Atherosclerosis (ICD10-I70.0). Electronically Signed   By: Kerby Moors M.D.   On: 02/04/2018 10:51        Scheduled Meds: . allopurinol  150 mg Oral Daily  . amiodarone  400 mg Oral BID  . amoxicillin-clavulanate  1 tablet Oral Q12H  . apixaban  5 mg Oral BID  . busPIRone  10 mg Oral BID  . cholecalciferol  1,000 Units Oral Daily  . docusate sodium  100 mg Oral QHS  . gi cocktail  30 mL Oral Once  . insulin aspart  0-5 Units Subcutaneous QHS  . insulin aspart  0-9 Units Subcutaneous TID WC  . insulin aspart protamine- aspart  25 Units Subcutaneous BID WC  . metoprolol tartrate  5 mg Intravenous Once  . oxybutynin  10 mg Oral BID  . pantoprazole  40 mg Oral BID  . PARoxetine  20 mg Oral Daily  . pravastatin  20 mg Oral QHS  . sodium chloride flush  3 mL Intravenous Q12H  . sodium polystyrene  30 g Oral Once  . traZODone  50 mg Oral QHS  . [START ON 02/06/2018] verapamil  120 mg Oral Daily   Continuous Infusions: . sodium chloride 50 mL/hr at 02/05/18 0052  . sodium chloride 250 mL (02/05/18 1318)     LOS: 1 day    Time spent: 35  minutes.     Hosie Poisson, MD [facility] Pager [phone]  If 7PM-7AM, please contact night-coverage [email or web address] Password [facility] 02/05/2018, 4:53 PM

## 2018-02-05 NOTE — Progress Notes (Signed)
Patient came to hospital with her own medications. She initially refused to take hospital medications, wanting to take her own medications due to cost concerns. After discussion with her about hospital policies, she now agrees to take hospital medications while inpatient. Her husband will bring her PTA medications back home this afternoon.   Leroy Libman, PharmD Pharmacy Resident Pager: [phone]

## 2018-02-05 NOTE — ED Notes (Signed)
Admitting aware of pt's CBG.

## 2018-02-05 NOTE — ED Notes (Signed)
Critical glucose 34, pt A&O x 4. Given orange juice and graham crackers. Admitting MD aware.

## 2018-02-05 NOTE — Plan of Care (Signed)
Aflutter with  RVR 120's. Eliquis. Started PO Amiodarone. NS bolus 250 ml  given. IVF @ 50 ml/hr. Plan CV 5/28.

## 2018-02-05 NOTE — Progress Notes (Signed)
Progress Note  Patient Name: Deborah Obrien Date of Encounter: 02/05/2018  Primary Cardiologist: sk  Primary Electrophysiologist: wsk   Patient Profile     77 y.o. female with sinus node dysfunction, tachy brayd admitted with rapid Afibl/flutt  cx by CHF   She had undergone device upgrade Single>>dual with amiodarone discontinued at taht time  Subjective   Feels better but is lying in bed Taking anticoagulation regularly  Inpatient Medications    Scheduled Meds: . allopurinol  150 mg Oral Daily  . amoxicillin-clavulanate  1 tablet Oral Q12H  . apixaban  5 mg Oral BID  . busPIRone  10 mg Oral BID  . cholecalciferol  1,000 Units Oral Daily  . docusate sodium  100 mg Oral QHS  . gi cocktail  30 mL Oral Once  . insulin aspart protamine- aspart  25 Units Subcutaneous BID WC  . metoprolol tartrate  5 mg Intravenous Once  . oxybutynin  10 mg Oral BID  . pantoprazole  40 mg Oral BID  . PARoxetine  20 mg Oral Daily  . pravastatin  20 mg Oral QHS  . traZODone  50 mg Oral QHS  . verapamil  240 mg Oral Daily   Continuous Infusions: . sodium chloride 50 mL/hr at 02/05/18 0052   PRN Meds: acetaminophen, acetaminophen, colchicine, hydrocortisone cream, ketoconazole, ondansetron (ZOFRAN) IV, polyvinyl alcohol   Vital Signs    Vitals:   02/05/18 0715 02/05/18 0745 02/05/18 0828 02/05/18 0858  BP: (!) 112/52 120/80 101/64 102/64  Pulse: (!) 109 (!) 115 64   Resp: 19 20 (!) 21 14  Temp:   98.8 F (37.1 C)   TempSrc:   Oral   SpO2: 99% 99% 97%   Weight:      Height:        Intake/Output Summary (Last 24 hours) at 02/05/2018 1021 Last data filed at 02/04/2018 2016 Gross per 24 hour  Intake 1028.75 ml  Output 150 ml  Net 878.75 ml   Filed Weights   02/04/18 0957  Weight: 249 lb (112.9 kg)    Telemetry    Aflut with 2;1  - Personally Reviewed  ECG    Aflutter  - Personally Reviewed  Physical Exam   GEN: No acute distress.   Neck: JVD 8cm Cardiac: rapid  but regular rate and rhythm murmurs, rubs, or gallops.  Respiratory: Clear to auscultation bilaterally. GI: Soft, nontender, non-distended  MS: tr edema; No deformity. Neuro:  Nonfocal  Psych: Normal affect  Skin Warm and dry   Labs    Chemistry Recent Labs  Lab 02/04/18 1048 02/05/18 0146  NA 134* 140  K 5.5* 5.5*  CL 102 108  CO2 20* 23  GLUCOSE 301* 35*  BUN 22* 27*  CREATININE 1.45* 1.79*  CALCIUM 9.1 8.6*  PROT  --  6.2*  ALBUMIN  --  3.5  AST  --  71*  ALT  --  45  ALKPHOS  --  133*  BILITOT  --  1.0  GFRNONAA 34* 26*  GFRAA 39* 30*  ANIONGAP 12 9     Hematology Recent Labs  Lab 02/04/18 1048 02/05/18 0146  WBC 5.0 8.6  RBC 4.54 4.37  HGB 14.4 13.7  HCT 43.0 43.4  MCV 94.7 99.3  MCH 31.7 31.4  MCHC 33.5 31.6  RDW 11.7 12.2  PLT 213 210    Cardiac EnzymesNo results for input(s): TROPONINI in the last 168 hours.  Recent Labs  Lab 02/04/18 1111 02/04/18 2039  TROPIPOC 0.02 0.06     BNPNo results for input(s): BNP, PROBNP in the last 168 hours.   DDimer No results for input(s): DDIMER in the last 168 hours.   Radiology    Dg Chest Port 1 View  Result Date: 02/04/2018 CLINICAL DATA:  Atrial fibrillation. EXAM: PORTABLE CHEST 1 VIEW COMPARISON:  12/29/2017 FINDINGS: There is a left chest wall pacer device with leads in the right atrial appendage and right ventricle. There is aortic atherosclerotic calcifications noted. No pleural effusion or edema. No airspace opacities. IMPRESSION: 1. No active disease. 2.  Aortic Atherosclerosis (ICD10-I70.0). Electronically Signed   By: Kerby Moors M.D.   On: 02/04/2018 10:51      Assessment & Plan    Paroxysmal atrial fibrillation/flutter with a rapid ventricular response   Sinus node dysfunction  Cardiomyopathy-interval normalization   Diabetes mellitus with nephropathy  Medtronic pacemaker upgraded to dual chamber  Renal function gd 3/4   Hyperkalemia--no drugs / T IV RTA   Will resume  amiodarone  Anticipate DCCV in am and discharge thereafter  Perhaps a low dose diuretic will attenuate K acculmulation    Signed, [name], MD  02/05/2018, 10:21 AM

## 2018-02-05 NOTE — Progress Notes (Signed)
Pt and husband refuse to take medications ordered and insist on taking home meds they have brought with them. Pt/s/o state they don't want to pay for the meds, since medicare " won't pay" for them. Informed pt/s/o that it is against hospital policy to take meds from home that can be provided by the pharmacy here. Sabrina RPh into room to talk with pt/s/o.

## 2018-02-06 ENCOUNTER — Encounter (HOSPITAL_COMMUNITY)
Admission: EM | Disposition: A | Discharge status: Home / Self Care | Payer: Self-pay | Source: Home / Self Care | Attending: Internal Medicine

## 2018-02-06 ENCOUNTER — Other Ambulatory Visit: Payer: Self-pay | Admitting: *Deleted

## 2018-02-06 ENCOUNTER — Inpatient Hospital Stay (HOSPITAL_COMMUNITY): Payer: Medicare Other | Admitting: Anesthesiology

## 2018-02-06 ENCOUNTER — Encounter (HOSPITAL_COMMUNITY): Payer: Self-pay

## 2018-02-06 ENCOUNTER — Other Ambulatory Visit: Payer: Self-pay | Admitting: Physician Assistant

## 2018-02-06 DIAGNOSIS — Z79899 Other long term (current) drug therapy: Secondary | ICD-10-CM

## 2018-02-06 DIAGNOSIS — E875 Hyperkalemia: Secondary | ICD-10-CM

## 2018-02-06 DIAGNOSIS — I4891 Unspecified atrial fibrillation: Secondary | ICD-10-CM

## 2018-02-06 HISTORY — PX: CARDIOVERSION: SHX1299

## 2018-02-06 LAB — BASIC METABOLIC PANEL
Anion gap: 7 (ref 5–15)
BUN: 20 mg/dL (ref 6–20)
CALCIUM: 8.2 mg/dL — AB (ref 8.9–10.3)
CO2: 28 mmol/L (ref 22–32)
CREATININE: 1.42 mg/dL — AB (ref 0.44–1.00)
Chloride: 107 mmol/L (ref 101–111)
GFR calc non Af Amer: 35 mL/min — ABNORMAL LOW (ref >60)
GFR, EST AFRICAN AMERICAN: 40 mL/min — AB (ref >60)
Glucose, Bld: 127 mg/dL — ABNORMAL HIGH (ref 65–99)
Potassium: 4.7 mmol/L (ref 3.5–5.1)
SODIUM: 142 mmol/L (ref 135–145)

## 2018-02-06 LAB — GLUCOSE, CAPILLARY
GLUCOSE-CAPILLARY: 68 mg/dL (ref 65–99)
GLUCOSE-CAPILLARY: 127 mg/dL — AB (ref 65–99)
GLUCOSE-CAPILLARY: 125 mg/dL — AB (ref 65–99)
Glucose-Capillary: 254 mg/dL — ABNORMAL HIGH (ref 65–99)

## 2018-02-06 SURGERY — CARDIOVERSION
Anesthesia: General

## 2018-02-06 MED ORDER — AMIODARONE HCL 400 MG PO TABS: ORAL_TABLET | ORAL | 0 refills | Status: DC

## 2018-02-06 MED ORDER — FUROSEMIDE 20 MG PO TABS: 20.0000 mg | ORAL_TABLET | Freq: Every day | ORAL | 0 refills | Status: DC

## 2018-02-06 MED ORDER — LIDOCAINE HCL (CARDIAC) PF 100 MG/5ML IV SOSY
PREFILLED_SYRINGE | INTRAVENOUS | Status: DC | PRN
  Administered 2018-02-06: 100 mg via INTRATRACHEAL

## 2018-02-06 MED ORDER — PROPOFOL 10 MG/ML IV BOLUS
INTRAVENOUS | Status: DC | PRN
  Administered 2018-02-06: 80 mg via INTRAVENOUS

## 2018-02-06 MED ORDER — DEXTROSE 50 % IV SOLN
INTRAVENOUS | Status: AC
  Administered 2018-02-06: 25 mL
  Filled 2018-02-06: qty 50

## 2018-02-06 MED ORDER — AMOXICILLIN-POT CLAVULANATE 875-125 MG PO TABS: 1.0000 | ORAL_TABLET | Freq: Two times a day (BID) | ORAL | 0 refills | Status: DC

## 2018-02-06 MED ORDER — FUROSEMIDE 20 MG PO TABS
20.0000 mg | ORAL_TABLET | Freq: Every day | ORAL | Status: DC
  Administered 2018-02-06: 20 mg via ORAL
  Filled 2018-02-06: qty 1

## 2018-02-06 NOTE — Interval H&P Note (Signed)
History and Physical Interval Note:  02/06/2018 11:44 AM  Deborah Obrien  has presented today for surgery, with the diagnosis of a fib  The various methods of treatment have been discussed with the patient and family. After consideration of risks, benefits and other options for treatment, the patient has consented to  Procedure(s): CARDIOVERSION (N/A) as a surgical intervention .  The patient's history has been reviewed, patient examined, no change in status, stable for surgery.  I have reviewed the patient's chart and labs.  Questions were answered to the patient's satisfaction.     Jenkins Rouge

## 2018-02-06 NOTE — Interval H&P Note (Signed)
History and Physical Interval Note:  02/06/2018 12:38 PM  Deborah Obrien  has presented today for surgery, with the diagnosis of a fib  The various methods of treatment have been discussed with the patient and family. After consideration of risks, benefits and other options for treatment, the patient has consented to  Procedure(s): CARDIOVERSION (N/A) as a surgical intervention .  The patient's history has been reviewed, patient examined, no change in status, stable for surgery.  I have reviewed the patient's chart and labs.  Questions were answered to the patient's satisfaction.     Jenkins Rouge

## 2018-02-06 NOTE — Progress Notes (Signed)
[name] asked to start patient on lasix Discussed with patient, she confirmed she has taken furosemide historically without reaction/intolerances.  Will start Lasix 20mg  daily 2/2 hyperkalemia.  Tommye Standard, PA-C

## 2018-02-06 NOTE — Anesthesia Preprocedure Evaluation (Signed)
Anesthesia Evaluation  Patient identified by MRN, date of birth, ID band Patient awake    Reviewed: Allergy & Precautions, NPO status , Patient's Chart, lab work & pertinent test results  Airway Mallampati: II       Dental  (+) Upper Dentures   Pulmonary shortness of breath and with exertion,     + decreased breath sounds      Cardiovascular Exercise Tolerance: Poor hypertension, Pt. on medications + angina + CAD, + Peripheral Vascular Disease and +CHF  (-) pacemaker Rhythm:Irregular Rate:Tachycardia     Neuro/Psych PSYCHIATRIC DISORDERS Anxiety Depression TIA Neuromuscular disease    GI/Hepatic GERD  Medicated,  Endo/Other  diabetes, Type 1, Insulin DependentMorbid obesity  Renal/GU CRFRenal disease     Musculoskeletal  (+) Arthritis ,   Abdominal (+) + obese,   Peds  Hematology   Anesthesia Other Findings   Reproductive/Obstetrics                             Anesthesia Physical  Anesthesia Plan  ASA: III  Anesthesia Plan: General   Post-op Pain Management:    Induction: Intravenous  PONV Risk Score and Plan:   Airway Management Planned: Natural Airway and Nasal Cannula  Additional Equipment:   Intra-op Plan:   Post-operative Plan:   Informed Consent: I have reviewed the patients History and Physical, chart, labs and discussed the procedure including the risks, benefits and alternatives for the proposed anesthesia with the patient or authorized representative who has indicated his/her understanding and acceptance.   Dental advisory given  Plan Discussed with: CRNA  Anesthesia Plan Comments:         Anesthesia Quick Evaluation

## 2018-02-06 NOTE — CV Procedure (Signed)
[place]: Anesthesia: Propofol/Lidocaine  Medtronic pacer Rhythm atrial flutter rate 130 Single biphasic shock 150J Converted to NSR rate 64  On Rx Eliquis  No immediate neurologic sequelae  Pacer function normal   [employer]

## 2018-02-06 NOTE — Discharge Summary (Signed)
Physician Discharge Summary  Deborah Obrien KGY:[id number] DOB: 11/25/1940 DOA: 02/04/2018  PCP: [name], MD  Admit date: 02/04/2018 Discharge date: 02/06/2018  Admitted From: Home.  Disposition:  Home.   Recommendations for Outpatient Follow-up:  1. Follow up with PCP in 1-2 weeks 2. Please obtain BMP/CBC in one week Please follow up with cardiology as recommended.   Discharge Condition:stable.  CODE STATUS: full code.  Diet recommendation: Heart Healthy   Brief/Interim Summary: Deborah Obrien a 77 y.o.femalewith medical history significant ofparoxysmal atrial fibrillation with symptomatic bradycardia status post single-chamber pacemaker insertion, diastolic CHF, diabetes, hypertension and morbid obesity who presented to the ER with shortness of breath as well as palpitations.    Discharge Diagnoses:  Principal Problem:   Atrial fibrillation ([place]) Active Problems:   Chronic kidney disease, stage 3, mod decreased GFR (HCC)   Essential hypertension   Sinus node dysfunction (HCC)   Hypotension   A-fib (HCC)  Atrial fibrillation with RVR: Rate better controlled today. cardiology consulted and patient underwent DCCV and she is in NSR.  Resume amiodarone for rate control.  As per cardiology recommendations, discontinued verapamil for low bp.  Amiodarone 400 mg BID for 2 weeks, followed by amiodarone 400 mg daily for 4 weeks, followed by amiodarone 200 mg daily.      Stage 3 ckd; Creatinine stable at 1.4    Diabetes Mellitus:  CBG (last 3)  Recent Labs    02/06/18 0805 02/06/18 1207 02/06/18 1604  GLUCAP 127* 125* 254*   Pt had hypoglycemic episode on 25 units BID, will revert back to her home dose of novolog 70/30    Hypertension:  Well controlled.    Hyperkalemia: Kayexalate given. Repeat BMP in am shows normal potassium.   Dysuria, urinary incontinence:  - discharge on augmentin to complete the course.     GOUT; Resume colchicine and allopurinol.       Discharge Instructions  Discharge Instructions    Diet - low sodium heart healthy   Complete by:  As directed    Discharge instructions   Complete by:  As directed    Please follow up with cardiology as recommended.  Please follow up with PCP in one week.     Allergies as of 02/06/2018      Reactions   Cephalosporins Other (See Comments)   Reaction:  Unknown    Macrolides And Ketolides Other (See Comments)   Reaction:  Unknown    Meperidine Shortness Of Breath, Nausea Only, Other (See Comments)   Reaction:  Stomach pain    Prednisone Anaphylaxis, Other (See Comments)   Reaction:  Increases pts blood sugar  Pt states that she is allergic to all steroids.     Shellfish Allergy Anaphylaxis, Shortness Of Breath, Diarrhea, Nausea Only, Rash   Reaction:  Stomach pain    Sulfa Antibiotics Shortness Of Breath, Diarrhea, Nausea Only, Other (See Comments)   Reaction:  Stomach pain    Uloric [febuxostat] Anaphylaxis   Locks pt's body up   Aspirin Other (See Comments)   Reaction:  Stomach pain    Celecoxib Other (See Comments)   Reaction:  GI bleed, weakness, and stomach pain.     Cephalexin Diarrhea, Nausea Only, Other (See Comments)   Reaction:  Stomach pain    Erythromycin Diarrhea, Nausea Only, Other (See Comments)   Reaction:  Stomach pain    Hydromorphone Other (See Comments)   Reaction:  Hypotension    Iodinated Diagnostic Agents Rash, Other (See Comments)  Pt states that she is unable to have because she has chronic kidney disease.     Oxycodone Other (See Comments)   Reaction:  Stomach pain    Atorvastatin Nausea Only, Other (See Comments)   Reaction:  Weakness    Codeine Diarrhea, Nausea Only, Other (See Comments)   Reaction:  Stomach pain Pt tolerates morphine    Doxycycline    Stomach pain    Tape Other (See Comments)   Reaction:  Causes pts skin to tear  Pt states that she is able to use paper tape.      Valdecoxib Swelling, Other (See Comments)   Pt states that her hands and feet swell.     Iodine Rash      Medication List    TAKE these medications   acetaminophen 500 MG tablet Commonly known as:  TYLENOL Take 1,000 mg by mouth every 6 (six) hours as needed for mild pain or moderate pain.   allopurinol 100 MG tablet Commonly known as:  ZYLOPRIM Take 150 mg by mouth daily.   amiodarone 400 MG tablet Commonly known as:  PACERONE Amiodarone 400 mg twice daily for 2 weeks, followed by Amiodarone 400 mg daily for 4 weeks followed by  Amiodarone 200 mg daily   amoxicillin-clavulanate 875-125 MG tablet Commonly known as:  AUGMENTIN Take 1 tablet by mouth every 12 (twelve) hours.   apixaban 5 MG Tabs tablet Commonly known as:  ELIQUIS Take 1 tablet (5 mg total) by mouth 2 (two) times daily.   busPIRone 10 MG tablet Commonly known as:  BUSPAR Take 10 mg by mouth 2 (two) times daily.   cholecalciferol 1000 units tablet Commonly known as:  VITAMIN D Take 1,000 Units by mouth daily.   colchicine 0.6 MG tablet Take 0.6 mg by mouth daily as needed (gout).   docusate sodium 100 MG capsule Commonly known as:  COLACE Take 100 mg by mouth at bedtime.   furosemide 20 MG tablet Commonly known as:  LASIX Take 1 tablet (20 mg total) by mouth daily. Start taking on:  02/07/2018   HYDROcodone-acetaminophen 5-325 MG tablet Commonly known as:  NORCO/VICODIN Take 1 tablet by mouth every 6 (six) hours as needed for moderate pain.   hydrocortisone 2.5 % cream Apply 1 application topically daily as needed (rash).   insulin NPH-regular Human (70-30) 100 UNIT/ML injection Commonly known as:  NOVOLIN 70/30 Inject 22 Units into the skin 2 (two) times daily before a meal.   ketoconazole 2 % cream Commonly known as:  NIZORAL Apply 1 application topically every 2 (two) hours as needed (for rash under breasts).   metoprolol tartrate 25 MG tablet Commonly known as:  LOPRESSOR Take 12.5  mg by mouth 2 (two) times daily.   oxybutynin 5 MG tablet Commonly known as:  DITROPAN Take 0.5 tablets (2.5 mg total) by mouth at bedtime. What changed:    how much to take  when to take this   pantoprazole 40 MG tablet Commonly known as:  PROTONIX Take 1 tablet (40 mg total) by mouth 2 (two) times daily.   PARoxetine 20 MG tablet Commonly known as:  PAXIL Take 20 mg by mouth daily.   pravastatin 20 MG tablet Commonly known as:  PRAVACHOL Take 20 mg by mouth at bedtime.   SYSTANE OP Apply 1 drop to eye daily as needed (dry eyes).   traZODone 50 MG tablet Commonly known as:  DESYREL Take 1 tablet (50 mg total) by mouth at bedtime.  ZEASORB powder Generic drug:  talc Apply 1 application topically as needed (under the breast irritation).      Follow-up Information    [facility] Follow up on 02/09/2018.   Specialty:  Cardiology Why:  Lab/blood work you may arriave anytime between [employer] and 4:00PM Contact information: [address] [place] [phone]       Patsey Berthold, NP Follow up on 03/14/2018.   Specialty:  Cardiology Why:  8:40AM Contact information: [place] [address] [phone]        [name], MD. Schedule an appointment as soon as possible for a visit in 1 week(s).   Specialty:  Internal Medicine Contact information: [place] [address] [phone]          Allergies  Allergen Reactions  . Cephalosporins Other (See Comments)    Reaction:  Unknown   . Macrolides And Ketolides Other (See Comments)    Reaction:  Unknown   . Meperidine Shortness Of Breath, Nausea Only and Other (See Comments)    Reaction:  Stomach pain   . Prednisone Anaphylaxis and Other (See Comments)    Reaction:  Increases pts blood sugar  Pt states that she is allergic to all steroids.    . Shellfish Allergy  Anaphylaxis, Shortness Of Breath, Diarrhea, Nausea Only and Rash    Reaction:  Stomach pain   . Sulfa Antibiotics Shortness Of Breath, Diarrhea, Nausea Only and Other (See Comments)    Reaction:  Stomach pain   . Uloric [Febuxostat] Anaphylaxis    Locks pt's body up   . Aspirin Other (See Comments)    Reaction:  Stomach pain   . Celecoxib Other (See Comments)    Reaction:  GI bleed, weakness, and stomach pain.    . Cephalexin Diarrhea, Nausea Only and Other (See Comments)    Reaction:  Stomach pain   . Erythromycin Diarrhea, Nausea Only and Other (See Comments)    Reaction:  Stomach pain   . Hydromorphone Other (See Comments)    Reaction:  Hypotension   . Iodinated Diagnostic Agents Rash and Other (See Comments)    Pt states that she is unable to have because she has chronic kidney disease.    Marland Kitchen Oxycodone Other (See Comments)    Reaction:  Stomach pain   . Atorvastatin Nausea Only and Other (See Comments)    Reaction:  Weakness   . Codeine Diarrhea, Nausea Only and Other (See Comments)    Reaction:  Stomach pain Pt tolerates morphine   . Doxycycline     Stomach pain   . Tape Other (See Comments)    Reaction:  Causes pts skin to tear  Pt states that she is able to use paper tape.    . Valdecoxib Swelling and Other (See Comments)    Pt states that her hands and feet swell.    . Iodine Rash    Consultations: Cardiology.   Procedures/Studies: Dg Chest Port 1 View  Result Date: 02/04/2018 CLINICAL DATA:  Atrial fibrillation. EXAM: PORTABLE CHEST 1 VIEW COMPARISON:  12/29/2017 FINDINGS: There is a left chest wall pacer device with leads in the right atrial appendage and right ventricle. There is aortic atherosclerotic calcifications noted. No pleural effusion or edema. No airspace opacities. IMPRESSION: 1. No active disease. 2.  Aortic Atherosclerosis (ICD10-I70.0). Electronically Signed   By: Kerby Moors M.D.   On:  02/04/2018 10:51    DCCV today.    Subjective: No new  complaints.   Discharge Exam: Vitals:   02/06/18 1310 02/06/18 1330  BP: (!) 106/49 (!) 106/38  Pulse: 71 (!) 59  Resp: 16 14  Temp: 98.5 F (36.9 C)   SpO2: 99% 97%   Vitals:   02/06/18 1005 02/06/18 1218 02/06/18 1310 02/06/18 1330  BP: (!) 118/50 (!) 110/57 (!) 106/49 (!) 106/38  Pulse:   71 (!) 59  Resp:  15 16 14   Temp:  98.5 F (36.9 C) 98.5 F (36.9 C)   TempSrc:  Oral    SpO2:  95% 99% 97%  Weight:  112.5 kg (248 lb)    Height:  5' 4.5" (1.638 m)      General: Pt is alert, awake, not in acute distress Cardiovascular: RRR, S1/S2 +, no rubs, no gallops Respiratory: CTA bilaterally, no wheezing, no rhonchi Abdominal: Soft, NT, ND, bowel sounds + Extremities: no edema, no cyanosis    The results of significant diagnostics from this hospitalization (including imaging, microbiology, ancillary and laboratory) are listed below for reference.     Microbiology: No results found for this or any previous visit (from the past 240 hour(s)).   Labs: BNP (last 3 results) No results for input(s): BNP in the last 8760 hours. Basic Metabolic Panel: Recent Labs  Lab 02/04/18 1048 02/05/18 0146 02/05/18 1307 02/06/18 0754  NA 134* 140 135 142  K 5.5* 5.5* 5.8* 4.7  CL 102 108 105 107  CO2 20* 23 23 28   GLUCOSE 301* 35* 289* 127*  BUN 22* 27* 25* 20  CREATININE 1.45* 1.79* 1.57* 1.42*  CALCIUM 9.1 8.6* 8.2* 8.2*   Liver Function Tests: Recent Labs  Lab 02/05/18 0146  AST 71*  ALT 45  ALKPHOS 133*  BILITOT 1.0  PROT 6.2*  ALBUMIN 3.5   No results for input(s): LIPASE, AMYLASE in the last 168 hours. No results for input(s): AMMONIA in the last 168 hours. CBC: Recent Labs  Lab 02/04/18 1048 02/05/18 0146  WBC 5.0 8.6  NEUTROABS  --  3.5  HGB 14.4 13.7  HCT 43.0 43.4  MCV 94.7 99.3  PLT 213 210   Cardiac Enzymes: No results for input(s): CKTOTAL, CKMB, CKMBINDEX, TROPONINI in the last 168 hours. BNP: Invalid input(s): POCBNP CBG: Recent Labs   Lab 02/05/18 2146 02/06/18 0725 02/06/18 0805 02/06/18 1207 02/06/18 1604  GLUCAP 101* 68 127* 125* 254*   D-Dimer No results for input(s): DDIMER in the last 72 hours. Hgb A1c Recent Labs    02/05/18 1307  HGBA1C 6.2*   Lipid Profile No results for input(s): CHOL, HDL, LDLCALC, TRIG, CHOLHDL, LDLDIRECT in the last 72 hours. Thyroid function studies Recent Labs    02/05/18 0146  TSH 2.948   Anemia work up No results for input(s): VITAMINB12, FOLATE, FERRITIN, TIBC, IRON, RETICCTPCT in the last 72 hours. Urinalysis    Component Value Date/Time   COLORURINE YELLOW 02/04/2018 2016   APPEARANCEUR CLOUDY (A) 02/04/2018 2016   APPEARANCEUR Hazy 10/29/2012 1534   LABSPEC 1.013 02/04/2018 2016   LABSPEC 1.010 10/29/2012 1534   PHURINE 5.0 02/04/2018 2016   GLUCOSEU NEGATIVE 02/04/2018 2016   GLUCOSEU Negative 10/29/2012 [place] NEGATIVE 02/04/2018 2016   BILIRUBINUR NEGATIVE 02/04/2018 2016   BILIRUBINUR Negative 10/29/2012 [place] 02/04/2018 2016   PROTEINUR NEGATIVE 02/04/2018 2016   NITRITE NEGATIVE 02/04/2018 2016   LEUKOCYTESUR LARGE (A) 02/04/2018 2016   LEUKOCYTESUR  Negative 10/29/2012 1534   Sepsis Labs Invalid input(s): PROCALCITONIN,  WBC,  LACTICIDVEN Microbiology No results found for this or any previous visit (from the past 240 hour(s)).   Time coordinating discharge: 35 minutes.  SIGNED:   Hosie Poisson, MD  [facility] 02/06/2018, 5:06 PM Pager   If 7PM-7AM, please contact night-coverage [email or web address] Password [email or web address]

## 2018-02-06 NOTE — H&P (View-Only) (Signed)
[name] asked to start patient on lasix Discussed with patient, she confirmed she has taken furosemide historically without reaction/intolerances.  Will start Lasix 20mg  daily 2/2 hyperkalemia.  Tommye Standard, PA-C

## 2018-02-06 NOTE — Anesthesia Procedure Notes (Signed)
Date/Time: 02/06/2018 1:02 PM Performed by: Izora Gala, CRNA Pre-anesthesia Checklist: Patient identified, Emergency Drugs available, Suction available and Patient being monitored Patient Re-evaluated:Patient Re-evaluated prior to induction Induction Type: IV induction Ventilation: Mask ventilation without difficulty Dental Injury: Teeth and Oropharynx as per pre-operative assessment

## 2018-02-06 NOTE — Transfer of Care (Signed)
Immediate Anesthesia Transfer of Care Note  Patient: Deborah Obrien  Procedure(s) Performed: CARDIOVERSION (N/A )  Patient Location: PACU  Anesthesia Type:General  Level of Consciousness: awake, sedated and patient cooperative  Airway & Oxygen Therapy: Patient Spontanous Breathing and Patient connected to nasal cannula oxygen  Post-op Assessment: Report given to RN and Post -op Vital signs reviewed and stable  Post vital signs: Reviewed and stable  Last Vitals:  Vitals Value Taken Time  BP 106/49 02/06/2018  1:10 PM  Temp 36.9 C 02/06/2018  1:10 PM  Pulse 63 02/06/2018  1:16 PM  Resp 16 02/06/2018  1:16 PM  SpO2 99 % 02/06/2018  1:16 PM  Vitals shown include unvalidated device data.  Last Pain:  Vitals:   02/06/18 1310  TempSrc:   PainSc: 0-No pain      Patients Stated Pain Goal: 0 (92/95/74 7340)  Complications: No apparent anesthesia complications

## 2018-02-06 NOTE — Progress Notes (Signed)
Patient received discharge information and acknowledged understanding of it. Patient received printed prescription. Patient IVs were removed.

## 2018-02-06 NOTE — Plan of Care (Signed)
  Problem: Clinical Measurements: Goal: Respiratory complications will improve Outcome: Progressing Note:  No s/s of respiratory complications noted. Goal: Cardiovascular complication will be avoided Outcome: Progressing Note:  No s/s of cardiovascular complications.   Problem: Coping: Goal: Level of anxiety will decrease Outcome: Progressing Note:  No s/s of anxiety noted.

## 2018-02-06 NOTE — Progress Notes (Addendum)
Progress Note  Patient Name: Deborah Obrien Date of Encounter: 02/06/2018  Primary Cardiologist: sk  Primary Electrophysiologist: wsk   Patient Profile     77 y.o. female with sinus node dysfunction, tachy brayd admitted with rapid Afibl/flutt  cx by CHF   She had undergone device upgrade Single>>dual with amiodarone discontinued at taht time  Subjective  Without complaint of sob or chest pain Out of bed yday  Inpatient Medications    Scheduled Meds: . allopurinol  150 mg Oral Daily  . amiodarone  400 mg Oral BID  . amoxicillin-clavulanate  1 tablet Oral Q12H  . apixaban  5 mg Oral BID  . busPIRone  10 mg Oral BID  . cholecalciferol  1,000 Units Oral Daily  . docusate sodium  100 mg Oral QHS  . gi cocktail  30 mL Oral Once  . insulin aspart  0-5 Units Subcutaneous QHS  . insulin aspart  0-9 Units Subcutaneous TID WC  . insulin aspart protamine- aspart  25 Units Subcutaneous BID WC  . metoprolol tartrate  5 mg Intravenous Once  . oxybutynin  10 mg Oral BID  . pantoprazole  40 mg Oral BID  . PARoxetine  20 mg Oral Daily  . pravastatin  20 mg Oral QHS  . sodium chloride flush  3 mL Intravenous Q12H  . traZODone  50 mg Oral QHS  . verapamil  120 mg Oral Daily   Continuous Infusions: . sodium chloride 50 mL/hr at 02/05/18 0052  . sodium chloride 250 mL (02/05/18 1318)   PRN Meds: acetaminophen, acetaminophen, colchicine, hydrocortisone cream, ketoconazole, ondansetron (ZOFRAN) IV, polyvinyl alcohol, sodium chloride flush   Vital Signs    Vitals:   02/05/18 1057 02/05/18 1141 02/05/18 2149 02/06/18 0441  BP: 104/64 (!) 94/52 (!) 126/101 93/62  Pulse:  (!) 115 (!) 131 (!) 129  Resp: (!) 22  20 19   Temp:  99 F (37.2 C) 99.1 F (37.3 C) 99 F (37.2 C)  TempSrc:  Oral    SpO2:  100% 94% 97%  Weight:    248 lb 1.6 oz (112.5 kg)  Height:        Intake/Output Summary (Last 24 hours) at 02/06/2018 0727 Last data filed at 02/06/2018 0443 Gross per 24 hour    Intake 1635.83 ml  Output 800 ml  Net 835.83 ml   Filed Weights   02/04/18 0957 02/06/18 0441  Weight: 249 lb (112.9 kg) 248 lb 1.6 oz (112.5 kg)    Telemetry    Flut 2:1   - Personally Reviewed  ECG     Physical Exam   Well developed and nourished in no acute distress HENT normal Neck supple with JVP-flat Carotids brisk and full without bruits Clear Rapid but regular rate and rhythm, no murmurs or gallops Abd-soft with active BS without hepatomegaly No Clubbing cyanosis edema Skin-warm and dry A & Oriented  Grossly normal sensory and motor function   Labs    Chemistry Recent Labs  Lab 02/04/18 1048 02/05/18 0146 02/05/18 1307  NA 134* 140 135  K 5.5* 5.5* 5.8*  CL 102 108 105  CO2 20* 23 23  GLUCOSE 301* 35* 289*  BUN 22* 27* 25*  CREATININE 1.45* 1.79* 1.57*  CALCIUM 9.1 8.6* 8.2*  PROT  --  6.2*  --   ALBUMIN  --  3.5  --   AST  --  71*  --   ALT  --  45  --   ALKPHOS  --  133*  --   BILITOT  --  1.0  --   GFRNONAA 34* 26* 31*  GFRAA 39* 30* 36*  ANIONGAP 12 9 7      Hematology Recent Labs  Lab 02/04/18 1048 02/05/18 0146  WBC 5.0 8.6  RBC 4.54 4.37  HGB 14.4 13.7  HCT 43.0 43.4  MCV 94.7 99.3  MCH 31.7 31.4  MCHC 33.5 31.6  RDW 11.7 12.2  PLT 213 210    Cardiac EnzymesNo results for input(s): TROPONINI in the last 168 hours.  Recent Labs  Lab 02/04/18 1111 02/04/18 2039  TROPIPOC 0.02 0.06     BNPNo results for input(s): BNP, PROBNP in the last 168 hours.   DDimer No results for input(s): DDIMER in the last 168 hours.   Radiology    Dg Chest Port 1 View  Result Date: 02/04/2018 CLINICAL DATA:  Atrial fibrillation. EXAM: PORTABLE CHEST 1 VIEW COMPARISON:  12/29/2017 FINDINGS: There is a left chest wall pacer device with leads in the right atrial appendage and right ventricle. There is aortic atherosclerotic calcifications noted. No pleural effusion or edema. No airspace opacities. IMPRESSION: 1. No active disease. 2.  Aortic  Atherosclerosis (ICD10-I70.0). Electronically Signed   By: Kerby Moors M.D.   On: 02/04/2018 10:51      Assessment & Plan    Paroxysmal atrial fibrillation/flutter with a rapid ventricular response   Sinus node dysfunction  Cardiomyopathy-interval normalization   Diabetes mellitus with nephropathy  Medtronic pacemaker upgraded to dual chamber  Renal function gd 3/4   Hyperkalemia--no drugs / T IV RTA  AST elevation    Received kayexalate last pm, will begin on fursoemide a kaliuretic and ask renal for thoughts--spoke with renal and they agree with above  For DCCV today   From our point of view would anticipate discharge post DCCV with followup Cr later this week   Will discontinue verapamil 2/2 low BP amio 400 bid x 2weeks  amio 400 qd x 4 weeks Then 200 mg daily  followup with cardiology in about 4-5 weeks   Will recheck AST at that time, previously normal will need to follow  Signed, [name], MD  02/06/2018, 7:27 AM

## 2018-02-06 NOTE — Progress Notes (Signed)
Inpatient Diabetes Program Recommendations  AACE/ADA: New Consensus Statement on Inpatient Glycemic Control (2015)  Target Ranges:  Prepandial:   less than 140 mg/dL      Peak postprandial:   less than 180 mg/dL (1-2 hours)      Critically ill patients:  140 - 180 mg/dL   Lab Results  Component Value Date   GLUCAP 127 (H) 02/06/2018   HGBA1C 6.2 (H) 02/05/2018    Review of Glycemic Control Results for LILYANNE, MCQUOWN (MRN [id number]) as of 02/06/2018 09:32  Ref. Range 02/05/2018 21:46 02/06/2018 07:25 02/06/2018 08:05  Glucose-Capillary Latest Ref Range: 65 - 99 mg/dL 101 (H) 68 127 (H)   Diabetes history: Type 2 DM Outpatient Diabetes medications: Novolog 70/30 25 units BID Current orders for Inpatient glycemic control: Novolog 70/30 25 units BID, Novolog 0-9 units TID, Novolog 0-5 units QHS  Inpatient Diabetes Program Recommendations:    Noted significant lows while in ED of 29 mg/dL on 5/26, assuming this was from patient taking own meds per chart?  At this time, patient is NPO for DCCV and per cards note plan is for patient is to be discharged following. If to remain inpatient, would recommend decreasing Novolog 70/30 to 14 units BID due to hypoglycemic episode this AM of 68 mg/dL and assuming that patient will have diet ordered.  Thanks, Bronson Curb, MSN, RNC-OB Diabetes Coordinator [phone] (8a-5p)

## 2018-02-07 ENCOUNTER — Telehealth: Payer: Self-pay | Admitting: Internal Medicine

## 2018-02-07 NOTE — Telephone Encounter (Signed)
New Message:     Pt c/o medication issue:  1. Name of Medication: Veratamil   2. How are you currently taking this medication (dosage and times per day)? 240 mg  3. Are you having a reaction (difficulty breathing--STAT)? N/A  4. What is your medication issue? Pt states this prescription was there when she picked up her other medication the other day. Pt states this medication is not on her discharge papers.

## 2018-02-07 NOTE — Telephone Encounter (Signed)
Spoke with pt's husband who wanted to clarify her discharge medications. He picked up an order for Verapamil with her prescriptions. I researched and it looks like her verapamil was dc'd by Dr Caryl Comes while she was in the hospital. Originally she was to be discharged on 5/26 and verapamil was on her discharge orders. We went through her medications on her discharge summary together and verified she was not to take verapamil. Pt's husband verbalized understanding and had no additional questions.

## 2018-02-07 NOTE — Anesthesia Postprocedure Evaluation (Signed)
Anesthesia Post Note  Patient: Deborah Obrien  Procedure(s) Performed: CARDIOVERSION (N/A )     Patient location during evaluation: PACU Anesthesia Type: General Level of consciousness: sedated and patient cooperative Pain management: pain level controlled Vital Signs Assessment: post-procedure vital signs reviewed and stable Respiratory status: spontaneous breathing Cardiovascular status: stable Anesthetic complications: no    Last Vitals:  Vitals:   02/06/18 1330 02/06/18 1739  BP: (!) 106/38 (!) 108/38  Pulse: (!) 59 69  Resp: 14   Temp:  37.1 C  SpO2: 97% 95%    Last Pain:  Vitals:   02/06/18 1739  TempSrc: Oral  PainSc:                  Nolon Nations

## 2018-02-08 ENCOUNTER — Telehealth: Payer: Self-pay

## 2018-02-08 LAB — URINE CULTURE: Culture: >=100000 — AB

## 2018-02-08 NOTE — Telephone Encounter (Signed)
Called patient. Patient is taking Augmentin. Patient complained of feeling weak and dizzy this morning. Patient stated her BP was 105/50 and HR 63. Encouraged patient to stay hydrated and drink water to help with her low BP. Encouraged patient to check her BP this evening and if it's still low to hold her metoprolol and call our office. Will send message to [name] and his nurse for further advisement.

## 2018-02-08 NOTE — Telephone Encounter (Signed)
-----   Message from Josue Hector, MD sent at 02/08/2018  9:05 AM EDT ----- I sent this to [place] hosptialist that d/c patient make sure she is on antibiotics

## 2018-02-08 NOTE — Telephone Encounter (Signed)
Spoke with pt who states her SBP continues to be in the 100s. Per Dr Caryl Comes, she is to discontinue her Metoprolol and monitor her HR and BP. If they begin to rise, please let the office know. Dr Caryl Comes may want to add something additional for her.

## 2018-02-09 ENCOUNTER — Other Ambulatory Visit: Payer: Medicare Other

## 2018-02-12 ENCOUNTER — Telehealth: Payer: Self-pay | Admitting: Nurse Practitioner

## 2018-02-12 DIAGNOSIS — Z79899 Other long term (current) drug therapy: Secondary | ICD-10-CM

## 2018-02-12 DIAGNOSIS — I480 Paroxysmal atrial fibrillation: Secondary | ICD-10-CM

## 2018-02-12 DIAGNOSIS — I4891 Unspecified atrial fibrillation: Secondary | ICD-10-CM

## 2018-02-12 DIAGNOSIS — I97190 Other postprocedural cardiac functional disturbances following cardiac surgery: Secondary | ICD-10-CM

## 2018-02-12 NOTE — Telephone Encounter (Signed)
Spoke with the patients husband re: labs she was not able to come in on May 31... He reports it is much easier taking her to [place] for her bloodwork. Orders were changed to [place] and pt to have done 02/13/18. Also, he reports it is difficult for her to be seen in [place] since he is returning to work full time. Appt for 4-5 week follow up and pacer check with Amber on 7/3 was rescheduled per his request with [name] in [facility] 03/20/18.

## 2018-02-12 NOTE — Telephone Encounter (Signed)
Follow up   Pt calling to see if he can have his labs done at [facility] regional. Please call

## 2018-02-12 NOTE — Telephone Encounter (Signed)
New Message    Per patient husband they were unable to have labs on May 31, they want to have it done in [place]

## 2018-02-13 ENCOUNTER — Other Ambulatory Visit
Admission: RE | Admit: 2018-02-13 | Discharge: 2018-02-13 | Disposition: A | Discharge status: Home / Self Care | Payer: Medicare Other | Source: Ambulatory Visit | Attending: Nurse Practitioner | Admitting: Nurse Practitioner

## 2018-02-13 DIAGNOSIS — I4891 Unspecified atrial fibrillation: Secondary | ICD-10-CM | POA: Insufficient documentation

## 2018-02-13 DIAGNOSIS — Z79899 Other long term (current) drug therapy: Secondary | ICD-10-CM | POA: Diagnosis present

## 2018-02-13 DIAGNOSIS — I480 Paroxysmal atrial fibrillation: Secondary | ICD-10-CM | POA: Diagnosis present

## 2018-02-13 DIAGNOSIS — I97190 Other postprocedural cardiac functional disturbances following cardiac surgery: Secondary | ICD-10-CM | POA: Insufficient documentation

## 2018-02-13 LAB — BASIC METABOLIC PANEL
Anion gap: 11 (ref 5–15)
BUN: 23 mg/dL — AB (ref 6–20)
CALCIUM: 9.1 mg/dL (ref 8.9–10.3)
CHLORIDE: 103 mmol/L (ref 101–111)
CO2: 24 mmol/L (ref 22–32)
CREATININE: 1.57 mg/dL — AB (ref 0.44–1.00)
GFR, EST AFRICAN AMERICAN: 36 mL/min — AB (ref >60)
GFR, EST NON AFRICAN AMERICAN: 31 mL/min — AB (ref >60)
Glucose, Bld: 78 mg/dL (ref 65–99)
Potassium: 4 mmol/L (ref 3.5–5.1)
SODIUM: 138 mmol/L (ref 135–145)

## 2018-02-13 LAB — HEPATIC FUNCTION PANEL
ALBUMIN: 3.9 g/dL (ref 3.5–5.0)
ALK PHOS: 119 U/L (ref 38–126)
ALT: 24 U/L (ref 14–54)
AST: 39 U/L (ref 15–41)
BILIRUBIN DIRECT: 0.2 mg/dL (ref 0.1–0.5)
BILIRUBIN TOTAL: 1.3 mg/dL — AB (ref 0.3–1.2)
Indirect Bilirubin: 1.1 mg/dL — ABNORMAL HIGH (ref 0.3–0.9)
Total Protein: 7.5 g/dL (ref 6.5–8.1)

## 2018-02-27 ENCOUNTER — Ambulatory Visit (INDEPENDENT_AMBULATORY_CARE_PROVIDER_SITE_OTHER): Payer: Medicare Other | Admitting: *Deleted

## 2018-02-27 ENCOUNTER — Telehealth: Payer: Self-pay | Admitting: Internal Medicine

## 2018-02-27 DIAGNOSIS — I4891 Unspecified atrial fibrillation: Secondary | ICD-10-CM

## 2018-02-27 NOTE — Progress Notes (Signed)
Remote pacemaker transmission.   

## 2018-02-27 NOTE — Telephone Encounter (Signed)
Patient husband came by office  Dropped off [employer] forms to be completed and signed for Eliquis 5 MG Placed in nurse box

## 2018-02-28 NOTE — Telephone Encounter (Signed)
Form obtained- for MD to complete.

## 2018-03-02 LAB — CUP PACEART REMOTE DEVICE CHECK
Battery Remaining Longevity: 152 mo
Battery Voltage: 3.11 V
Brady Statistic AP VP Percent: 0.03 %
Brady Statistic RA Percent Paced: 87.02 %
Brady Statistic RV Percent Paced: 0.04 %
Implantable Lead Implant Date: 20181217
Implantable Lead Location: 753860
Implantable Lead Model: 5076
Implantable Pulse Generator Implant Date: 20181217
Lead Channel Impedance Value: 380 Ohm
Lead Channel Impedance Value: 456 Ohm
Lead Channel Pacing Threshold Amplitude: 0.875 V
Lead Channel Pacing Threshold Pulse Width: 0.4 ms
Lead Channel Sensing Intrinsic Amplitude: 6.75 mV
Lead Channel Setting Pacing Amplitude: 2 V
Lead Channel Setting Pacing Pulse Width: 0.4 ms
Lead Channel Setting Sensing Sensitivity: 1.2 mV
MDC IDC LEAD IMPLANT DT: 20161019
MDC IDC LEAD LOCATION: 753859
MDC IDC MSMT LEADCHNL RA IMPEDANCE VALUE: 342 Ohm
MDC IDC MSMT LEADCHNL RA IMPEDANCE VALUE: 399 Ohm
MDC IDC MSMT LEADCHNL RA SENSING INTR AMPL: 6.75 mV
MDC IDC MSMT LEADCHNL RV PACING THRESHOLD AMPLITUDE: 0.625 V
MDC IDC MSMT LEADCHNL RV PACING THRESHOLD PULSEWIDTH: 0.4 ms
MDC IDC MSMT LEADCHNL RV SENSING INTR AMPL: 6.375 mV
MDC IDC MSMT LEADCHNL RV SENSING INTR AMPL: 6.375 mV
MDC IDC SESS DTM: 20190618043424
MDC IDC SET LEADCHNL RV PACING AMPLITUDE: 2.5 V
MDC IDC STAT BRADY AP VS PERCENT: 86.97 %
MDC IDC STAT BRADY AS VP PERCENT: 0.01 %
MDC IDC STAT BRADY AS VS PERCENT: 12.99 %

## 2018-03-05 ENCOUNTER — Other Ambulatory Visit: Payer: Self-pay | Admitting: *Deleted

## 2018-03-05 MED ORDER — APIXABAN 5 MG PO TABS: 5.0000 mg | ORAL_TABLET | Freq: Two times a day (BID) | ORAL | 3 refills | Status: DC

## 2018-03-06 NOTE — Telephone Encounter (Signed)
I left a message for the patient to call. I advised her on her identified voice mail that her Eliquis patient assistance form is complete and ready for pick up. Just wanted to make sure they wanted to come by and pick it up.

## 2018-03-06 NOTE — Telephone Encounter (Signed)
The patient called back and stated she and her husband will come by and pick up the patient assistance form.  I advised her this is at the front desk.

## 2018-03-07 ENCOUNTER — Telehealth: Payer: Self-pay | Admitting: Internal Medicine

## 2018-03-07 NOTE — Telephone Encounter (Signed)
New message    Pt c/o medication issue:  1. Name of Medication: Verapamil   2. How are you currently taking this medication (dosage and times per day)?   3. Are you having a reaction (difficulty breathing--STAT)?   4. What is your medication issue? Tablets on backorder, please send request for capsule  instead

## 2018-03-08 NOTE — Telephone Encounter (Signed)
Spoke with pt today who confirmed she is not taking Verapamil. This medication was ordered then dc'd during her last hospital discharge. I called the pharmacy to inform them pt is to not be ordered verapamil. They stated they have taken this off her list and had no additional questions.

## 2018-03-13 ENCOUNTER — Other Ambulatory Visit: Payer: Self-pay | Admitting: Internal Medicine

## 2018-03-13 DIAGNOSIS — Z1231 Encounter for screening mammogram for malignant neoplasm of breast: Secondary | ICD-10-CM

## 2018-03-14 ENCOUNTER — Encounter: Payer: Medicare Other | Admitting: Nurse Practitioner

## 2018-03-20 ENCOUNTER — Ambulatory Visit (INDEPENDENT_AMBULATORY_CARE_PROVIDER_SITE_OTHER): Payer: Medicare Other | Admitting: Internal Medicine

## 2018-03-20 ENCOUNTER — Encounter: Payer: Self-pay | Admitting: Internal Medicine

## 2018-03-20 ENCOUNTER — Other Ambulatory Visit
Admission: RE | Admit: 2018-03-20 | Discharge: 2018-03-20 | Disposition: A | Discharge status: Home / Self Care | Payer: Medicare Other | Source: Ambulatory Visit | Attending: Internal Medicine | Admitting: Internal Medicine

## 2018-03-20 VITALS — BP 123/66 | HR 69 | Ht 64.5 in | Wt 237.0 lb

## 2018-03-20 DIAGNOSIS — Z950 Presence of cardiac pacemaker: Secondary | ICD-10-CM | POA: Insufficient documentation

## 2018-03-20 DIAGNOSIS — E1121 Type 2 diabetes mellitus with diabetic nephropathy: Secondary | ICD-10-CM | POA: Diagnosis not present

## 2018-03-20 DIAGNOSIS — Z79899 Other long term (current) drug therapy: Secondary | ICD-10-CM | POA: Insufficient documentation

## 2018-03-20 DIAGNOSIS — I495 Sick sinus syndrome: Secondary | ICD-10-CM | POA: Insufficient documentation

## 2018-03-20 DIAGNOSIS — I209 Angina pectoris, unspecified: Secondary | ICD-10-CM

## 2018-03-20 DIAGNOSIS — I480 Paroxysmal atrial fibrillation: Secondary | ICD-10-CM | POA: Insufficient documentation

## 2018-03-20 DIAGNOSIS — Z7901 Long term (current) use of anticoagulants: Secondary | ICD-10-CM | POA: Diagnosis not present

## 2018-03-20 DIAGNOSIS — Z794 Long term (current) use of insulin: Secondary | ICD-10-CM | POA: Diagnosis not present

## 2018-03-20 LAB — CUP PACEART INCLINIC DEVICE CHECK
Battery Remaining Longevity: 153 mo
Battery Voltage: 3.09 V
Brady Statistic AS VP Percent: 0.01 %
Brady Statistic RA Percent Paced: 80.35 %
Implantable Lead Implant Date: 20161019
Implantable Lead Location: 753859
Implantable Lead Model: 5076
Implantable Pulse Generator Implant Date: 20181217
Lead Channel Impedance Value: 342 Ohm
Lead Channel Impedance Value: 418 Ohm
Lead Channel Pacing Threshold Amplitude: 1 V
Lead Channel Pacing Threshold Pulse Width: 0.4 ms
Lead Channel Pacing Threshold Pulse Width: 0.4 ms
Lead Channel Sensing Intrinsic Amplitude: 7.375 mV
Lead Channel Setting Pacing Amplitude: 2.5 V
MDC IDC LEAD IMPLANT DT: 20181217
MDC IDC LEAD LOCATION: 753860
MDC IDC MSMT LEADCHNL RV IMPEDANCE VALUE: 380 Ohm
MDC IDC MSMT LEADCHNL RV IMPEDANCE VALUE: 437 Ohm
MDC IDC MSMT LEADCHNL RV PACING THRESHOLD AMPLITUDE: 0.75 V
MDC IDC MSMT LEADCHNL RV SENSING INTR AMPL: 8.25 mV
MDC IDC SESS DTM: 20190709125007
MDC IDC SET LEADCHNL RA PACING AMPLITUDE: 2 V
MDC IDC SET LEADCHNL RV PACING PULSEWIDTH: 0.4 ms
MDC IDC SET LEADCHNL RV SENSING SENSITIVITY: 1.2 mV
MDC IDC STAT BRADY AP VP PERCENT: 0.03 %
MDC IDC STAT BRADY AP VS PERCENT: 80.29 %
MDC IDC STAT BRADY AS VS PERCENT: 19.67 %
MDC IDC STAT BRADY RV PERCENT PACED: 0.04 %

## 2018-03-20 LAB — COMPREHENSIVE METABOLIC PANEL
ALBUMIN: 4.1 g/dL (ref 3.5–5.0)
ALT: 16 U/L (ref 0–44)
ANION GAP: 11 (ref 5–15)
AST: 35 U/L (ref 15–41)
Alkaline Phosphatase: 93 U/L (ref 38–126)
BILIRUBIN TOTAL: 1.1 mg/dL (ref 0.3–1.2)
BUN: 40 mg/dL — ABNORMAL HIGH (ref 8–23)
CO2: 25 mmol/L (ref 22–32)
Calcium: 8.9 mg/dL (ref 8.9–10.3)
Chloride: 100 mmol/L (ref 98–111)
Creatinine, Ser: 1.9 mg/dL — ABNORMAL HIGH (ref 0.44–1.00)
GFR calc Af Amer: 28 mL/min — ABNORMAL LOW (ref >60)
GFR calc non Af Amer: 24 mL/min — ABNORMAL LOW (ref >60)
GLUCOSE: 188 mg/dL — AB (ref 70–99)
POTASSIUM: 4.2 mmol/L (ref 3.5–5.1)
Sodium: 136 mmol/L (ref 135–145)
TOTAL PROTEIN: 7.4 g/dL (ref 6.5–8.1)

## 2018-03-20 MED ORDER — AMIODARONE HCL 400 MG PO TABS: ORAL_TABLET | ORAL | 6 refills | Status: DC

## 2018-03-20 NOTE — Patient Instructions (Addendum)
Medication Instructions: - Your physician has recommended you make the following change in your medication:   1) HOLD lasix (furosemide) for now.  Labwork: - Your physician recommends that you have lab work today: CMET  Procedures/Testing: - none ordered  Follow-Up: - Your physician recommends that you schedule a follow-up appointment in: 2 months with [name].  - Remote monitoring is used to monitor your Pacemaker of ICD from home. This monitoring reduces the number of office visits required to check your device to one time per year. It allows [place] to keep an eye on the functioning of your device to ensure it is working properly. You are scheduled for a device check from home on 05/29/18. You may send your transmission at any time that day. If you have a wireless device, the transmission will be sent automatically. After your physician reviews your transmission, you will receive a postcard with your next transmission date.   Any Additional Special Instructions Will Be Listed Below (If Applicable). - please wear an abdominal binder during the hours that you are awake (up & moving around)    If you need a refill on your cardiac medications before your next appointment, please call your pharmacy.

## 2018-03-20 NOTE — Progress Notes (Signed)
Patient Care Team: [name], MD as PCP - General (Internal Medicine) [name], MD (Internal Medicine) Bary Castilla Forest Gleason, MD (General Surgery)   HPI  Deborah Obrien is a 77 y.o. female Seen for sinus node dysfunction exercise intolerance and previously implanted single chamber pacemaker for which she underwent dual chamber upgrade 12/18 cx by encircling of the chronic RV lead by the atrial lead resulting in tension for which she underwent RA lead revision   She has hx of atrial arrhythmias, with atrial fib and flutter, typical/atypical all documented   \Admitted 5/19 with recurrent atrial flutter complicated by congestive heart failure.  She was restarted on amiodarone (having been discontinued 12/18) and discharged on a diuretic.  She has had problems with significant orthostatic intolerance further complicated by orthostatic dyspnea.  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 A-fib ([place]) 2016  . Acid reflux   . Anxiety   . Bowel obstruction ([place])   . CHF (congestive heart failure) ([place]) 2017  . Chronic kidney disease (CKD) stage G3a/A1, moderately decreased glomerular filtration rate (GFR) between 45-59 mL/min/1.73 square meter and albuminuria creatinine ratio less than 30 mg/g (HCC)   . Diabetes mellitus without complication ([place])   . Dyspnea   . Fatty liver   . GI bleed   . Gout   . Hypertension   . Morbid obesity ([place])   . Osteoarthritis   . Osteoporosis   . Paroxysmal atrial fibrillation (HCC)   . Presence of permanent cardiac pacemaker   . Sinus node dysfunction ([place]) 08/24/2017    . TIA (transient ischemic attack)     Past Surgical History:  Procedure Laterality Date  . ABDOMINAL HYSTERECTOMY  1980  . APPENDECTOMY    . BACK SURGERY     2008  . BREAST BIOPSY Right 08/16   stereo fibroadenomatous change, neg for atypia  . BREAST EXCISIONAL BIOPSY Left    neg  . CARDIAC CATHETERIZATION Left 08/25/2016   Procedure: Left Heart Cath and Coronary Angiography;  Surgeon: Corey Skains, MD;  Location: [place] [facility];  Service: Cardiovascular;  Laterality: Left;  . CARDIOVERSION N/A 06/28/2017   Procedure: CARDIOVERSION;  Surgeon: Corey Skains, MD;  Location: [facility];  Service: Cardiovascular;  Laterality: N/A;  . CARDIOVERSION N/A 02/06/2018   Procedure: CARDIOVERSION;  Surgeon: Josue Hector, MD;  Location: [facility];  Service: Cardiovascular;  Laterality: N/A;  . CATARACT EXTRACTION, BILATERAL  2012  . CHOLECYSTECTOMY    . colon blockage  1999  . COLON SURGERY  1999  . COLONOSCOPY  2016   polyps removed 2016  . DORSAL COMPARTMENT RELEASE Left 09/14/2016   Procedure: RELEASE DORSAL COMPARTMENT (DEQUERVAIN);  Surgeon: Dereck Leep, MD;  Location: [facility];  Service: Orthopedics;  Laterality: Left;  . ESOPHAGOGASTRODUODENOSCOPY N/A 01/27/2015   Procedure: ESOPHAGOGASTRODUODENOSCOPY (EGD);  Surgeon: Lollie Sails, MD;  Location: [facility];  Service: Endoscopy;  Laterality: N/A;  .  ESOPHAGOGASTRODUODENOSCOPY (EGD) WITH PROPOFOL N/A 07/24/2015   Procedure: ESOPHAGOGASTRODUODENOSCOPY (EGD) WITH PROPOFOL;  Surgeon: Lucilla Lame, MD;  Location: [facility];  Service: Endoscopy;  Laterality: N/A;  . JOINT REPLACEMENT  2014   Bilateral Knee replacement  . LEAD REVISION/REPAIR N/A 08/29/2017   Procedure: LEAD REVISION/REPAIR;  Surgeon: Constance Haw, MD;  Location: [place] [facility];  Service: Cardiovascular;  Laterality: N/A;  . PACEMAKER INSERTION Left 07/01/2015   Procedure: INSERTION PACEMAKER;  Surgeon: Isaias Cowman, MD;   Location: [facility];  Service: Cardiovascular;  Laterality: Left;  . PACEMAKER REVISION N/A 08/28/2017   Procedure: PACEMAKER REVISION;  Surgeon: Deboraha Sprang, MD;  Location: [place] [facility];  Service: Cardiovascular;  Laterality: N/A;  . REPLACEMENT TOTAL KNEE BILATERAL    . SHOULDER ARTHROSCOPY WITH SUBACROMIAL DECOMPRESSION Left 2013  . TEE WITHOUT CARDIOVERSION N/A 06/28/2017   Procedure: TRANSESOPHAGEAL ECHOCARDIOGRAM (TEE);  Surgeon: Corey Skains, MD;  Location: [facility];  Service: Cardiovascular;  Laterality: N/A;    Current Outpatient Medications  Medication Sig Dispense Refill  . acetaminophen (TYLENOL) 500 MG tablet Take 1,000 mg by mouth every 6 (six) hours as needed for mild pain or moderate pain.     Marland Kitchen allopurinol (ZYLOPRIM) 100 MG tablet Take 150 mg by mouth daily.     Marland Kitchen amiodarone (PACERONE) 400 MG tablet Amiodarone 400 mg twice daily for 2 weeks, followed by Amiodarone 400 mg daily for 4 weeks followed by  Amiodarone 200 mg daily (Patient taking differently: Take 200 mg by mouth daily. ) 90 tablet 0  . apixaban (ELIQUIS) 5 MG TABS tablet Take 1 tablet (5 mg total) by mouth 2 (two) times daily. 180 tablet 3  . busPIRone (BUSPAR) 10 MG tablet Take 10 mg by mouth 2 (two) times daily.    . cholecalciferol (VITAMIN D) 1000 units tablet Take 1,000 Units by mouth daily.    . colchicine 0.6 MG tablet Take 0.6 mg by mouth daily as needed (gout).     Marland Kitchen docusate sodium (COLACE) 100 MG capsule Take 100 mg by mouth at bedtime.     . furosemide (LASIX) 20 MG tablet Take 1 tablet (20 mg total) by mouth daily. 30 tablet 0  . HYDROcodone-acetaminophen (NORCO/VICODIN) 5-325 MG tablet Take 1 tablet by mouth every 6 (six) hours as needed for moderate pain. 8 tablet 0  . hydrocortisone 2.5 % cream Apply 1 application topically daily as needed (rash).    . insulin NPH-regular Human (NOVOLIN 70/30) (70-30) 100 UNIT/ML injection Inject 22 Units into the skin 2 (two) times daily before a  meal.     . ketoconazole (NIZORAL) 2 % cream Apply 1 application topically every 2 (two) hours as needed (for rash under breasts).     Marland Kitchen oxybutynin (DITROPAN) 5 MG tablet Take 0.5 tablets (2.5 mg total) by mouth at bedtime. (Patient taking differently: Take 10 mg by mouth 2 (two) times daily. ) 30 tablet 0  . pantoprazole (PROTONIX) 40 MG tablet Take 1 tablet (40 mg total) by mouth 2 (two) times daily. 60 tablet 0  . PARoxetine (PAXIL) 20 MG tablet Take 20 mg by mouth daily.    Vladimir Faster Glycol-Propyl Glycol (SYSTANE OP) Apply 1 drop to eye daily as needed (dry eyes).    . pravastatin (PRAVACHOL) 20 MG tablet Take 20 mg by mouth at bedtime.     . talc (ZEASORB) powder Apply 1 application topically as needed (under the breast irritation).    . traZODone (DESYREL) 50  MG tablet Take 1 tablet (50 mg total) by mouth at bedtime. 30 tablet 0   No current facility-administered medications for this visit.     Allergies  Allergen Reactions  . Cephalosporins Other (See Comments)    Reaction:  Unknown   . Macrolides And Ketolides Other (See Comments)    Reaction:  Unknown   . Meperidine Shortness Of Breath, Nausea Only and Other (See Comments)    Reaction:  Stomach pain   . Prednisone Anaphylaxis and Other (See Comments)    Reaction:  Increases pts blood sugar  Pt states that she is allergic to all steroids.    . Shellfish Allergy Anaphylaxis, Shortness Of Breath, Diarrhea, Nausea Only and Rash    Reaction:  Stomach pain   . Sulfa Antibiotics Shortness Of Breath, Diarrhea, Nausea Only and Other (See Comments)    Reaction:  Stomach pain   . Uloric [Febuxostat] Anaphylaxis    Locks pt's body up   . Aspirin Other (See Comments)    Reaction:  Stomach pain   . Celecoxib Other (See Comments)    Reaction:  GI bleed, weakness, and stomach pain.    . Cephalexin Diarrhea, Nausea Only and Other (See Comments)    Reaction:  Stomach pain   . Erythromycin Diarrhea, Nausea Only and Other (See Comments)      Reaction:  Stomach pain   . Hydromorphone Other (See Comments)    Reaction:  Hypotension   . Iodinated Diagnostic Agents Rash and Other (See Comments)    Pt states that she is unable to have because she has chronic kidney disease.    Marland Kitchen Oxycodone Other (See Comments)    Reaction:  Stomach pain   . Atorvastatin Nausea Only and Other (See Comments)    Reaction:  Weakness   . Codeine Diarrhea, Nausea Only and Other (See Comments)    Reaction:  Stomach pain Pt tolerates morphine   . Doxycycline     Stomach pain   . Tape Other (See Comments)    Reaction:  Causes pts skin to tear  Pt states that she is able to use paper tape.    . Valdecoxib Swelling and Other (See Comments)    Pt states that her hands and feet swell.    . Iodine Rash      Review of Systems negative except from HPI and PMH  Physical Exam BP 123/66 (BP Location: Right Arm, Patient Position: Sitting, Cuff Size: Large)   Pulse 69   Ht 5' 4.5" (1.638 m)   Wt 237 lb (107.5 kg)   BMI 40.05 kg/m  Well developed and nourished in no acute distress HENT normal Neck supple with JVP-flat Clear Regular rate and rhythm, no murmurs or gallops Abd-soft with active BS No Clubbing cyanosis edema Skin-warm and dry A & Oriented  Grossly normal sensory and motor function    ECG atrial pacing at 69 Interval 21/09/43  Assessment and  Plan  Sinus node dysfunction  Paroxysmal atrial fibrillation/flutter with a rapid ventricular response  Cardiomyopathy-interval normalization   Diabetes mellitus with nephropathy  Orthostatic intolerance and falls  HFpEF  Thromboembolic risk profile as noted above   Medtronic pacemaker upgraded to dual chamber The patient's device was interrogated and the information was fully reviewed.  The device was reprogrammed to maximize longevity     She is euvolemic.  We will discontinue her diuretics and use them as needed.  Blood pressure is low and she has significant orthostatic  intolerance.  The  potential contributors would include diabetic neuropathy, intravascular depletion, and aggravation by amiodarone via neurotoxicity.  I would be surprised with the long-standing amiodarone exposure previously that this would be the explanation  For now, we will discontinue her diuretics as noted and using abdominal binder.  We will plan to review things in about 6-8 weeks and make a decision about the amiodarone.  Her atrial fibrillation is been relatively infrequent.  On Anticoagulation;  No bleeding issues   and thankfully she was approved for Eliquis by [employer]  More than 50% of 40 min was spent in counseling related to the above .

## 2018-03-21 ENCOUNTER — Other Ambulatory Visit: Payer: Self-pay | Admitting: *Deleted

## 2018-03-21 DIAGNOSIS — N289 Disorder of kidney and ureter, unspecified: Secondary | ICD-10-CM

## 2018-03-30 ENCOUNTER — Other Ambulatory Visit
Admission: RE | Admit: 2018-03-30 | Discharge: 2018-03-30 | Disposition: A | Discharge status: Home / Self Care | Payer: Medicare Other | Source: Ambulatory Visit | Attending: Internal Medicine | Admitting: Internal Medicine

## 2018-03-30 DIAGNOSIS — N289 Disorder of kidney and ureter, unspecified: Secondary | ICD-10-CM | POA: Insufficient documentation

## 2018-03-30 LAB — BASIC METABOLIC PANEL
Anion gap: 12 (ref 5–15)
BUN: 22 mg/dL (ref 8–23)
CALCIUM: 8.5 mg/dL — AB (ref 8.9–10.3)
CO2: 21 mmol/L — AB (ref 22–32)
CREATININE: 1.87 mg/dL — AB (ref 0.44–1.00)
Chloride: 105 mmol/L (ref 98–111)
GFR calc non Af Amer: 25 mL/min — ABNORMAL LOW (ref >60)
GFR, EST AFRICAN AMERICAN: 29 mL/min — AB (ref >60)
GLUCOSE: 189 mg/dL — AB (ref 70–99)
Potassium: 4.3 mmol/L (ref 3.5–5.1)
Sodium: 138 mmol/L (ref 135–145)

## 2018-04-03 ENCOUNTER — Telehealth: Payer: Self-pay | Admitting: Internal Medicine

## 2018-04-03 NOTE — Telephone Encounter (Signed)
Agree with the need for her to see her PCP;  With asymmetric edema and erythema , this may be more cellulitis or ? DVT  Probably need to not push her diuretics

## 2018-04-03 NOTE — Telephone Encounter (Signed)
I called the patient with her lab results. She states she has had increased swelling to her lower extremities R>L. However her right left is red and warm to touch. Her left leg feels tight and tingling.  Her BP in the morning before getting up is usually 100/50 and when she stands up she is 90/40. She describes her weight as "stable" although she can have a 3-5 lb weight gain over night. She states she is flucuating between 240-244 lbs. She did take a lasix 20 mg tablet Sunday and yesterday. She has been urinating. She states that her waistline/ side hurt like they did prior to a previous UTI. I have advised her that the reddness/ heat to her right foot may not be related to fluid. She is aware I will review with  [name] as her renal function has been higher than usual for her lately. She is also aware that she should touch base with her PCP regarding the discomfort in her back/ side.  She is aware I will call her back with any further recommendations and is agreeable.

## 2018-04-04 ENCOUNTER — Emergency Department: Payer: Medicare Other

## 2018-04-04 ENCOUNTER — Encounter: Payer: Self-pay | Admitting: Emergency Medicine

## 2018-04-04 ENCOUNTER — Inpatient Hospital Stay
Admission: EM | Admit: 2018-04-04 | Discharge: 2018-04-23 | DRG: 393 | Disposition: A | Discharge status: Skilled Nursing Facility | Payer: Medicare Other | Attending: Internal Medicine | Admitting: Internal Medicine

## 2018-04-04 ENCOUNTER — Other Ambulatory Visit: Payer: Self-pay

## 2018-04-04 DIAGNOSIS — Z9841 Cataract extraction status, right eye: Secondary | ICD-10-CM

## 2018-04-04 DIAGNOSIS — K219 Gastro-esophageal reflux disease without esophagitis: Secondary | ICD-10-CM | POA: Diagnosis present

## 2018-04-04 DIAGNOSIS — L97518 Non-pressure chronic ulcer of other part of right foot with other specified severity: Secondary | ICD-10-CM | POA: Diagnosis present

## 2018-04-04 DIAGNOSIS — Z8679 Personal history of other diseases of the circulatory system: Secondary | ICD-10-CM

## 2018-04-04 DIAGNOSIS — Z8249 Family history of ischemic heart disease and other diseases of the circulatory system: Secondary | ICD-10-CM

## 2018-04-04 DIAGNOSIS — Z79899 Other long term (current) drug therapy: Secondary | ICD-10-CM

## 2018-04-04 DIAGNOSIS — D6832 Hemorrhagic disorder due to extrinsic circulating anticoagulants: Secondary | ICD-10-CM | POA: Diagnosis present

## 2018-04-04 DIAGNOSIS — Z90710 Acquired absence of both cervix and uterus: Secondary | ICD-10-CM

## 2018-04-04 DIAGNOSIS — K760 Fatty (change of) liver, not elsewhere classified: Secondary | ICD-10-CM | POA: Diagnosis present

## 2018-04-04 DIAGNOSIS — E875 Hyperkalemia: Secondary | ICD-10-CM | POA: Diagnosis not present

## 2018-04-04 DIAGNOSIS — K922 Gastrointestinal hemorrhage, unspecified: Secondary | ICD-10-CM

## 2018-04-04 DIAGNOSIS — R52 Pain, unspecified: Secondary | ICD-10-CM

## 2018-04-04 DIAGNOSIS — E1122 Type 2 diabetes mellitus with diabetic chronic kidney disease: Secondary | ICD-10-CM | POA: Diagnosis present

## 2018-04-04 DIAGNOSIS — Z885 Allergy status to narcotic agent status: Secondary | ICD-10-CM

## 2018-04-04 DIAGNOSIS — F419 Anxiety disorder, unspecified: Secondary | ICD-10-CM | POA: Diagnosis present

## 2018-04-04 DIAGNOSIS — I5033 Acute on chronic diastolic (congestive) heart failure: Secondary | ICD-10-CM | POA: Diagnosis present

## 2018-04-04 DIAGNOSIS — G4700 Insomnia, unspecified: Secondary | ICD-10-CM | POA: Diagnosis present

## 2018-04-04 DIAGNOSIS — Z86010 Personal history of colonic polyps: Secondary | ICD-10-CM

## 2018-04-04 DIAGNOSIS — I2729 Other secondary pulmonary hypertension: Secondary | ICD-10-CM | POA: Diagnosis present

## 2018-04-04 DIAGNOSIS — I4892 Unspecified atrial flutter: Secondary | ICD-10-CM | POA: Diagnosis not present

## 2018-04-04 DIAGNOSIS — Z794 Long term (current) use of insulin: Secondary | ICD-10-CM

## 2018-04-04 DIAGNOSIS — M545 Low back pain: Secondary | ICD-10-CM | POA: Diagnosis present

## 2018-04-04 DIAGNOSIS — Z7901 Long term (current) use of anticoagulants: Secondary | ICD-10-CM

## 2018-04-04 DIAGNOSIS — N179 Acute kidney failure, unspecified: Secondary | ICD-10-CM | POA: Diagnosis not present

## 2018-04-04 DIAGNOSIS — E1149 Type 2 diabetes mellitus with other diabetic neurological complication: Secondary | ICD-10-CM

## 2018-04-04 DIAGNOSIS — I480 Paroxysmal atrial fibrillation: Secondary | ICD-10-CM | POA: Diagnosis present

## 2018-04-04 DIAGNOSIS — J440 Chronic obstructive pulmonary disease with acute lower respiratory infection: Secondary | ICD-10-CM | POA: Diagnosis not present

## 2018-04-04 DIAGNOSIS — Z96653 Presence of artificial knee joint, bilateral: Secondary | ICD-10-CM | POA: Diagnosis present

## 2018-04-04 DIAGNOSIS — K317 Polyp of stomach and duodenum: Secondary | ICD-10-CM | POA: Diagnosis not present

## 2018-04-04 DIAGNOSIS — M810 Age-related osteoporosis without current pathological fracture: Secondary | ICD-10-CM | POA: Diagnosis present

## 2018-04-04 DIAGNOSIS — E11621 Type 2 diabetes mellitus with foot ulcer: Secondary | ICD-10-CM | POA: Diagnosis present

## 2018-04-04 DIAGNOSIS — Z886 Allergy status to analgesic agent status: Secondary | ICD-10-CM

## 2018-04-04 DIAGNOSIS — J690 Pneumonitis due to inhalation of food and vomit: Secondary | ICD-10-CM | POA: Diagnosis not present

## 2018-04-04 DIAGNOSIS — Z9911 Dependence on respirator [ventilator] status: Secondary | ICD-10-CM

## 2018-04-04 DIAGNOSIS — Z91048 Other nonmedicinal substance allergy status: Secondary | ICD-10-CM

## 2018-04-04 DIAGNOSIS — N189 Chronic kidney disease, unspecified: Secondary | ICD-10-CM

## 2018-04-04 DIAGNOSIS — Z8719 Personal history of other diseases of the digestive system: Secondary | ICD-10-CM

## 2018-04-04 DIAGNOSIS — I4821 Permanent atrial fibrillation: Secondary | ICD-10-CM | POA: Diagnosis present

## 2018-04-04 DIAGNOSIS — K56609 Unspecified intestinal obstruction, unspecified as to partial versus complete obstruction: Secondary | ICD-10-CM | POA: Diagnosis present

## 2018-04-04 DIAGNOSIS — Z808 Family history of malignant neoplasm of other organs or systems: Secondary | ICD-10-CM

## 2018-04-04 DIAGNOSIS — A4159 Other Gram-negative sepsis: Secondary | ICD-10-CM | POA: Diagnosis not present

## 2018-04-04 DIAGNOSIS — J9600 Acute respiratory failure, unspecified whether with hypoxia or hypercapnia: Secondary | ICD-10-CM

## 2018-04-04 DIAGNOSIS — E870 Hyperosmolality and hypernatremia: Secondary | ICD-10-CM | POA: Diagnosis not present

## 2018-04-04 DIAGNOSIS — J9622 Acute and chronic respiratory failure with hypercapnia: Secondary | ICD-10-CM | POA: Diagnosis not present

## 2018-04-04 DIAGNOSIS — E782 Mixed hyperlipidemia: Secondary | ICD-10-CM | POA: Diagnosis present

## 2018-04-04 DIAGNOSIS — I2510 Atherosclerotic heart disease of native coronary artery without angina pectoris: Secondary | ICD-10-CM | POA: Diagnosis present

## 2018-04-04 DIAGNOSIS — J150 Pneumonia due to Klebsiella pneumoniae: Secondary | ICD-10-CM | POA: Diagnosis not present

## 2018-04-04 DIAGNOSIS — K920 Hematemesis: Secondary | ICD-10-CM | POA: Diagnosis present

## 2018-04-04 DIAGNOSIS — F339 Major depressive disorder, recurrent, unspecified: Secondary | ICD-10-CM | POA: Diagnosis present

## 2018-04-04 DIAGNOSIS — Z452 Encounter for adjustment and management of vascular access device: Secondary | ICD-10-CM

## 2018-04-04 DIAGNOSIS — Z9049 Acquired absence of other specified parts of digestive tract: Secondary | ICD-10-CM

## 2018-04-04 DIAGNOSIS — Z4659 Encounter for fitting and adjustment of other gastrointestinal appliance and device: Secondary | ICD-10-CM

## 2018-04-04 DIAGNOSIS — E876 Hypokalemia: Secondary | ICD-10-CM | POA: Diagnosis not present

## 2018-04-04 DIAGNOSIS — I4439 Other atrioventricular block: Secondary | ICD-10-CM | POA: Diagnosis not present

## 2018-04-04 DIAGNOSIS — Z833 Family history of diabetes mellitus: Secondary | ICD-10-CM

## 2018-04-04 DIAGNOSIS — N184 Chronic kidney disease, stage 4 (severe): Secondary | ICD-10-CM

## 2018-04-04 DIAGNOSIS — G92 Toxic encephalopathy: Secondary | ICD-10-CM | POA: Diagnosis not present

## 2018-04-04 DIAGNOSIS — I495 Sick sinus syndrome: Secondary | ICD-10-CM | POA: Diagnosis present

## 2018-04-04 DIAGNOSIS — I130 Hypertensive heart and chronic kidney disease with heart failure and stage 1 through stage 4 chronic kidney disease, or unspecified chronic kidney disease: Secondary | ICD-10-CM | POA: Diagnosis present

## 2018-04-04 DIAGNOSIS — D62 Acute posthemorrhagic anemia: Secondary | ICD-10-CM | POA: Diagnosis not present

## 2018-04-04 DIAGNOSIS — G8929 Other chronic pain: Secondary | ICD-10-CM | POA: Diagnosis present

## 2018-04-04 DIAGNOSIS — Z6841 Body Mass Index (BMI) 40.0 and over, adult: Secondary | ICD-10-CM

## 2018-04-04 DIAGNOSIS — I4891 Unspecified atrial fibrillation: Secondary | ICD-10-CM | POA: Diagnosis not present

## 2018-04-04 DIAGNOSIS — Z881 Allergy status to other antibiotic agents status: Secondary | ICD-10-CM

## 2018-04-04 DIAGNOSIS — K56600 Partial intestinal obstruction, unspecified as to cause: Secondary | ICD-10-CM | POA: Diagnosis present

## 2018-04-04 DIAGNOSIS — M109 Gout, unspecified: Secondary | ICD-10-CM | POA: Diagnosis present

## 2018-04-04 DIAGNOSIS — Z9842 Cataract extraction status, left eye: Secondary | ICD-10-CM

## 2018-04-04 DIAGNOSIS — Z8673 Personal history of transient ischemic attack (TIA), and cerebral infarction without residual deficits: Secondary | ICD-10-CM

## 2018-04-04 DIAGNOSIS — Z91013 Allergy to seafood: Secondary | ICD-10-CM

## 2018-04-04 DIAGNOSIS — I469 Cardiac arrest, cause unspecified: Secondary | ICD-10-CM | POA: Diagnosis not present

## 2018-04-04 DIAGNOSIS — R6521 Severe sepsis with septic shock: Secondary | ICD-10-CM | POA: Diagnosis not present

## 2018-04-04 DIAGNOSIS — N289 Disorder of kidney and ureter, unspecified: Secondary | ICD-10-CM

## 2018-04-04 DIAGNOSIS — Z91041 Radiographic dye allergy status: Secondary | ICD-10-CM

## 2018-04-04 DIAGNOSIS — Z888 Allergy status to other drugs, medicaments and biological substances status: Secondary | ICD-10-CM

## 2018-04-04 DIAGNOSIS — Z950 Presence of cardiac pacemaker: Secondary | ICD-10-CM | POA: Diagnosis present

## 2018-04-04 DIAGNOSIS — I482 Chronic atrial fibrillation: Secondary | ICD-10-CM | POA: Diagnosis present

## 2018-04-04 DIAGNOSIS — T45515A Adverse effect of anticoagulants, initial encounter: Secondary | ICD-10-CM | POA: Diagnosis present

## 2018-04-04 DIAGNOSIS — Z882 Allergy status to sulfonamides status: Secondary | ICD-10-CM

## 2018-04-04 DIAGNOSIS — E1140 Type 2 diabetes mellitus with diabetic neuropathy, unspecified: Secondary | ICD-10-CM | POA: Diagnosis present

## 2018-04-04 DIAGNOSIS — S90821A Blister (nonthermal), right foot, initial encounter: Secondary | ICD-10-CM | POA: Diagnosis present

## 2018-04-04 DIAGNOSIS — X58XXXA Exposure to other specified factors, initial encounter: Secondary | ICD-10-CM | POA: Diagnosis present

## 2018-04-04 DIAGNOSIS — J9621 Acute and chronic respiratory failure with hypoxia: Secondary | ICD-10-CM | POA: Diagnosis not present

## 2018-04-04 DIAGNOSIS — N183 Chronic kidney disease, stage 3 (moderate): Secondary | ICD-10-CM | POA: Diagnosis present

## 2018-04-04 DIAGNOSIS — R0600 Dyspnea, unspecified: Secondary | ICD-10-CM

## 2018-04-04 DIAGNOSIS — M1611 Unilateral primary osteoarthritis, right hip: Secondary | ICD-10-CM | POA: Diagnosis present

## 2018-04-04 LAB — COMPREHENSIVE METABOLIC PANEL
ALBUMIN: 4 g/dL (ref 3.5–5.0)
ALK PHOS: 92 U/L (ref 38–126)
ALT: 19 U/L (ref 0–44)
AST: 39 U/L (ref 15–41)
Anion gap: 9 (ref 5–15)
BILIRUBIN TOTAL: 1.4 mg/dL — AB (ref 0.3–1.2)
BUN: 22 mg/dL (ref 8–23)
CALCIUM: 9.6 mg/dL (ref 8.9–10.3)
CO2: 25 mmol/L (ref 22–32)
CREATININE: 1.53 mg/dL — AB (ref 0.44–1.00)
Chloride: 109 mmol/L (ref 98–111)
GFR, EST AFRICAN AMERICAN: 37 mL/min — AB (ref >60)
GFR, EST NON AFRICAN AMERICAN: 32 mL/min — AB (ref >60)
Glucose, Bld: 102 mg/dL — ABNORMAL HIGH (ref 70–99)
Potassium: 4.2 mmol/L (ref 3.5–5.1)
SODIUM: 143 mmol/L (ref 135–145)
TOTAL PROTEIN: 7.4 g/dL (ref 6.5–8.1)

## 2018-04-04 LAB — TROPONIN I

## 2018-04-04 LAB — CBC
HCT: 39.4 % (ref 35.0–47.0)
Hemoglobin: 13.4 g/dL (ref 12.0–16.0)
MCH: 33.2 pg (ref 26.0–34.0)
MCHC: 34 g/dL (ref 32.0–36.0)
MCV: 97.6 fL (ref 80.0–100.0)
PLATELETS: 218 10*3/uL (ref 150–440)
RBC: 4.04 MIL/uL (ref 3.80–5.20)
RDW: 13.4 % (ref 11.5–14.5)
WBC: 8.2 10*3/uL (ref 3.6–11.0)

## 2018-04-04 LAB — LIPASE, BLOOD: LIPASE: 25 U/L (ref 11–51)

## 2018-04-04 LAB — GLUCOSE, CAPILLARY: Glucose-Capillary: 124 mg/dL — ABNORMAL HIGH (ref 70–99)

## 2018-04-04 LAB — PROTIME-INR
INR: 1.07
PROTHROMBIN TIME: 13.8 s (ref 11.4–15.2)

## 2018-04-04 LAB — LACTIC ACID, PLASMA: Lactic Acid, Venous: 1.2 mmol/L (ref 0.5–1.9)

## 2018-04-04 MED ORDER — MORPHINE SULFATE (PF) 4 MG/ML IV SOLN
4.0000 mg | Freq: Once | INTRAVENOUS | Status: AC
  Administered 2018-04-04: 4 mg via INTRAVENOUS
  Filled 2018-04-04: qty 1

## 2018-04-04 MED ORDER — PANTOPRAZOLE SODIUM 40 MG IV SOLR
40.0000 mg | Freq: Once | INTRAVENOUS | Status: AC
  Administered 2018-04-04: 40 mg via INTRAVENOUS
  Filled 2018-04-04: qty 40

## 2018-04-04 MED ORDER — ONDANSETRON HCL 4 MG/2ML IJ SOLN
4.0000 mg | Freq: Once | INTRAMUSCULAR | Status: AC
  Administered 2018-04-04: 4 mg via INTRAVENOUS
  Filled 2018-04-04: qty 2

## 2018-04-04 MED ORDER — SODIUM CHLORIDE 0.9 % IV BOLUS
500.0000 mL | Freq: Once | INTRAVENOUS | Status: AC
  Administered 2018-04-04: 500 mL via INTRAVENOUS

## 2018-04-04 NOTE — ED Notes (Signed)
Pt to ct at this time.

## 2018-04-04 NOTE — ED Triage Notes (Signed)
Pt comes into the ED via [facility] from home c/o abdominal pain and emesis that is brown/red in color.  Patient has pacemaker/defib and is in Afib.  Patient has h/o TIA and is on eliquis.  Patient alert and oriented x 4 at this time.  Patient unable to sit still at this time due to the pain.  VSS with EMS 148/113, 99% RA, 91HR

## 2018-04-04 NOTE — ED Notes (Signed)
Pt returned from ct at this time

## 2018-04-04 NOTE — ED Provider Notes (Signed)
[facility] Emergency Department Provider Note ____________________________________________   First MD Initiated Contact with Patient 04/04/18 2024     (approximate)  I have reviewed the triage vital signs and the nursing notes.   HISTORY  Chief Complaint Hematemesis and Abdominal Pain    HPI Deborah Obrien is a 77 y.o. female with PMH as noted below including atrial fibrillation on Eliquis, as well as prior history of gastric polyps and upper GI bleeding who presents with nausea and vomiting, acute onset approximately 6 hours ago, and associated with some emesis with dark red coffee-ground type material.  Patient reports associated epigastric abdominal pain.  She denies chest pain, difficulty breathing, urinary symptoms, or fever.  She denies any change in her bowel movements, or blood in the stool.  She states that she last took her Eliquis this morning.  Past Medical History:  Diagnosis Date  . A-fib ([place]) 2016  . Acid reflux   . Anxiety   . Bowel obstruction ([place])   . CHF (congestive heart failure) ([place]) 2017  . Chronic kidney disease (CKD) stage G3a/A1, moderately decreased glomerular filtration rate (GFR) between 45-59 mL/min/1.73 square meter and albuminuria creatinine ratio less than 30 mg/g (HCC)   . Diabetes mellitus without complication ([place])   . Dyspnea   . Fatty liver   . GI bleed   . Gout   . Hypertension   . Morbid obesity ([place])   . Osteoarthritis   . Osteoporosis   . Paroxysmal atrial fibrillation (HCC)   . Presence of permanent cardiac pacemaker   . Sinus node dysfunction ([place]) 08/24/2017  . TIA (transient ischemic attack)     Patient Active Problem List   Diagnosis Date Noted  . Hypotension 02/04/2018  . A-fib ([place]) 02/04/2018  . Atrial flutter ([place]) 08/24/2017  . Sinus node dysfunction ([place]) 08/24/2017  . Dizziness 06/12/2017  . Mastalgia 02/14/2017  . Chronic kidney disease, stage 3, mod decreased GFR (HCC) 01/09/2017    . Fatty liver 01/09/2017  . Benign essential HTN 09/08/2016  . Nephrolithiasis 02/22/2016  . Diabetic neuropathy with neurologic complication ([place]) 85/27/7824  . Moderate episode of recurrent major depressive disorder ([place]) 08/11/2015  . Gastritis without bleeding   . Gastric polyp   . Gastro-esophageal reflux disease without esophagitis   . Atrial fibrillation ([place]) 06/29/2015  . Intestinal obstruction ([place]) 02/20/2015  . DDD (degenerative disc disease), lumbar 01/19/2015  . Back pain 01/12/2015  . Gout 01/12/2015  . Complete tear of left rotator cuff 10/16/2014  . Intervertebral disc disorder with radiculopathy of lumbar region 09/17/2014  . Lumbar radiculitis 09/17/2014  . Lumbar stenosis with neurogenic claudication 09/17/2014  . Neuritis or radiculitis due to rupture of lumbar intervertebral disc 09/17/2014  . Atherosclerosis of both carotid arteries 07/15/2014  . Carotid atherosclerosis 07/15/2014  . Essential hypertension 07/15/2014  . Hyperlipemia, mixed 07/15/2014  . 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 Body mass index (BMI) of 40.0-44.9 in adult ([place]) 06/21/2014  . Mixed incontinence 06/25/2013  . Mixed urge and stress incontinence 06/25/2013  . Nocturia 06/25/2013  . Blurring of visual image 04/11/2012    Past Surgical History:  Procedure Laterality Date  . ABDOMINAL HYSTERECTOMY  1980  . APPENDECTOMY    . BACK SURGERY     2008  . BREAST BIOPSY Right 08/16   stereo fibroadenomatous change, neg for atypia  . BREAST EXCISIONAL BIOPSY Left    neg  . CARDIAC CATHETERIZATION Left 08/25/2016   Procedure: Left Heart Cath and  Coronary Angiography;  Surgeon: Corey Skains, MD;  Location: [facility];  Service: Cardiovascular;  Laterality: Left;  . CARDIOVERSION N/A 06/28/2017   Procedure: CARDIOVERSION;  Surgeon: Corey Skains, MD;  Location: [facility];  Service: Cardiovascular;  Laterality: N/A;  . CARDIOVERSION N/A 02/06/2018   Procedure:  CARDIOVERSION;  Surgeon: Josue Hector, MD;  Location: [place];  Service: Cardiovascular;  Laterality: N/A;  . CATARACT EXTRACTION, BILATERAL  2012  . CHOLECYSTECTOMY    . colon blockage  1999  . COLON SURGERY  1999  . COLONOSCOPY  2016   polyps removed 2016  . DORSAL COMPARTMENT RELEASE Left 09/14/2016   Procedure: RELEASE DORSAL COMPARTMENT (DEQUERVAIN);  Surgeon: Dereck Leep, MD;  Location: [facility];  Service: Orthopedics;  Laterality: Left;  . ESOPHAGOGASTRODUODENOSCOPY N/A 01/27/2015   Procedure: ESOPHAGOGASTRODUODENOSCOPY (EGD);  Surgeon: Lollie Sails, MD;  Location: [facility];  Service: Endoscopy;  Laterality: N/A;  . ESOPHAGOGASTRODUODENOSCOPY (EGD) WITH PROPOFOL N/A 07/24/2015   Procedure: ESOPHAGOGASTRODUODENOSCOPY (EGD) WITH PROPOFOL;  Surgeon: Lucilla Lame, MD;  Location: [facility];  Service: Endoscopy;  Laterality: N/A;  . JOINT REPLACEMENT  2014   Bilateral Knee replacement  . LEAD REVISION/REPAIR N/A 08/29/2017   Procedure: LEAD REVISION/REPAIR;  Surgeon: Constance Haw, MD;  Location: [place];  Service: Cardiovascular;  Laterality: N/A;  . PACEMAKER INSERTION Left 07/01/2015   Procedure: INSERTION PACEMAKER;  Surgeon: Isaias Cowman, MD;  Location: [facility];  Service: Cardiovascular;  Laterality: Left;  . PACEMAKER REVISION N/A 08/28/2017   Procedure: PACEMAKER REVISION;  Surgeon: Deboraha Sprang, MD;  Location: [facility];  Service: Cardiovascular;  Laterality: N/A;  . REPLACEMENT TOTAL KNEE BILATERAL    . SHOULDER ARTHROSCOPY WITH SUBACROMIAL DECOMPRESSION Left 2013  . TEE WITHOUT CARDIOVERSION N/A 06/28/2017   Procedure: TRANSESOPHAGEAL ECHOCARDIOGRAM (TEE);  Surgeon: Corey Skains, MD;  Location: [facility];  Service: Cardiovascular;  Laterality: N/A;    Prior to Admission medications   Medication Sig Start Date End Date Taking? Authorizing Provider  acetaminophen (TYLENOL) 500 MG tablet Take 1,000 mg by mouth every  6 (six) hours as needed for mild pain or moderate pain.    Yes [provider]  allopurinol (ZYLOPRIM) 100 MG tablet Take 150 mg by mouth daily.    Yes [provider]  amiodarone (PACERONE) 400 MG tablet Take 1/2 tablet (200 mg) by mouth once daily as directed 03/20/18  Yes Deboraha Sprang, MD  apixaban (ELIQUIS) 5 MG TABS tablet Take 1 tablet (5 mg total) by mouth 2 (two) times daily. 03/05/18  Yes Deboraha Sprang, MD  busPIRone (BUSPAR) 10 MG tablet Take 10 mg by mouth 2 (two) times daily.   Yes [provider]  cholecalciferol (VITAMIN D) 1000 units tablet Take 1,000 Units by mouth daily.   Yes [provider]  colchicine 0.6 MG tablet Take 0.6 mg by mouth daily as needed (gout).    Yes [provider]  docusate sodium (COLACE) 100 MG capsule Take 100 mg by mouth at bedtime.    Yes [provider]  insulin NPH-regular Human (NOVOLIN 70/30) (70-30) 100 UNIT/ML injection Inject 22 Units into the skin 2 (two) times daily before a meal.    Yes [provider]  ketoconazole (NIZORAL) 2 % cream Apply 1 application topically every 2 (two) hours as needed (for rash under breasts).    Yes [provider]  oxybutynin (DITROPAN) 5 MG tablet Take 0.5 tablets (2.5 mg  total) by mouth at bedtime. Patient taking differently: Take 10 mg by mouth 2 (two) times daily.  06/13/17  Yes Epifanio Lesches, MD  pantoprazole (PROTONIX) 40 MG tablet Take 1 tablet (40 mg total) by mouth 2 (two) times daily. 12/29/17  Yes Darel Hong, MD  PARoxetine (PAXIL) 20 MG tablet Take 20 mg by mouth daily.   Yes [provider]  Polyethyl Glycol-Propyl Glycol (SYSTANE OP) Apply 1 drop to eye 2 (two) times daily as needed (dry eyes).    Yes [provider]  pravastatin (PRAVACHOL) 20 MG tablet Take 20 mg by mouth at bedtime.    Yes [provider]  talc (ZEASORB) powder Apply 1 application topically as needed (under the breast  irritation).   Yes [provider]  traZODone (DESYREL) 50 MG tablet Take 1 tablet (50 mg total) by mouth at bedtime. 06/13/17  Yes Epifanio Lesches, MD  furosemide (LASIX) 20 MG tablet Take 1 tablet (20 mg total) by mouth daily. Patient not taking: Reported on 04/04/2018 02/07/18   Hosie Poisson, MD  HYDROcodone-acetaminophen (NORCO/VICODIN) 5-325 MG tablet Take 1 tablet by mouth every 6 (six) hours as needed for moderate pain. Patient not taking: Reported on 04/04/2018 08/30/17 08/30/18  Baldwin Jamaica, PA-C  hydrocortisone 2.5 % cream Apply 1 application topically daily as needed (rash).    [provider]    Allergies Cephalosporins; Macrolides and ketolides; Meperidine; Prednisone; Shellfish allergy; Sulfa antibiotics; Uloric [febuxostat]; Aspirin; Celecoxib; Cephalexin; Erythromycin; Hydromorphone; Iodinated diagnostic agents; Oxycodone; Atorvastatin; Codeine; Doxycycline; Tape; Valdecoxib; and Iodine  Family History  Problem Relation Age of Onset  . Diabetes Mellitus II Mother   . CAD Mother   . Heart attack Mother   . Cancer Father        skin  . Breast cancer Neg Hx     Social History Social History   Tobacco Use  . Smoking status: Never Smoker  . Smokeless tobacco: Never Used  Substance Use Topics  . Alcohol use: No  . Drug use: No    Review of Systems  Constitutional: No fever. Eyes: No redness. ENT: No sore throat. Cardiovascular: Denies chest pain. Respiratory: Denies shortness of breath. Gastrointestinal: Positive for nausea and vomiting.  Positive for abdominal pain. Genitourinary: Negative for dysuria.  Musculoskeletal: Negative for back pain. Skin: Negative for rash. Neurological: Negative for headache.   ____________________________________________   PHYSICAL EXAM:  VITAL SIGNS: ED Triage Vitals  Enc Vitals Group     BP 04/04/18 2028 134/63     Pulse Rate 04/04/18 2028 77     Resp 04/04/18 2028 (!) 28     Temp 04/04/18  2028 97.7 F (36.5 C)     Temp Source 04/04/18 2028 Oral     SpO2 04/04/18 2028 100 %     Weight 04/04/18 2025 244 lb (110.7 kg)     Height 04/04/18 2025 5' 4.5" (1.638 m)     Head Circumference --      Peak Flow --      Pain Score 04/04/18 2024 9     Pain Loc --      Pain Edu? --      Excl. in [facility]? --     Constitutional: Alert and oriented.  Uncomfortable appearing but in no acute distress. Eyes: Conjunctivae are normal.  No scleral icterus. Head: Atraumatic. Nose: No congestion/rhinnorhea. Mouth/Throat: Mucous membranes are slightly dry.   Neck: Normal range of motion.  Cardiovascular: Normal rate, regular rhythm. Grossly normal heart sounds.  Good peripheral circulation. Respiratory: Normal respiratory effort.  No retractions. Lungs CTAB. Gastrointestinal: Soft with moderate epigastric tenderness. No distention.  Genitourinary: No flank tenderness. Musculoskeletal: No lower extremity edema.  Extremities warm and well perfused.  Neurologic:  Normal speech and language. No gross focal neurologic deficits are appreciated.  Skin:  Skin is warm and dry. No rash noted. Psychiatric: Mood and affect are normal. Speech and behavior are normal.  ____________________________________________   LABS (all labs ordered are listed, but only abnormal results are displayed)  Labs Reviewed  COMPREHENSIVE METABOLIC PANEL - Abnormal; Notable for the following components:      Result Value   Glucose, Bld 102 (*)    Creatinine, Ser 1.53 (*)    Total Bilirubin 1.4 (*)    GFR calc non Af Amer 32 (*)    GFR calc Af Amer 37 (*)    All other components within normal limits  CBC  PROTIME-INR  LIPASE, BLOOD  TROPONIN I  LACTIC ACID, PLASMA  LACTIC ACID, PLASMA  POC OCCULT BLOOD, ED  TYPE AND SCREEN   ____________________________________________  EKG  ED ECG REPORT I, Arta Silence, the attending physician, personally viewed and interpreted this ECG.  Date: 04/04/2018 EKG Time:  2029 Rate: 69 Rhythm: Atrial paced rhythm QRS Axis: normal Intervals: normal ST/T Wave abnormalities: normal Narrative Interpretation: no evidence of acute ischemia  ____________________________________________  RADIOLOGY  CT abdomen: Small bowel obstruction  ____________________________________________   PROCEDURES  Procedure(s) performed: No  Procedures  Critical Care performed: Yes  CRITICAL CARE Performed by: Arta Silence   Total critical care time: 35 minutes  Critical care time was exclusive of separately billable procedures and treating other patients.  Critical care was necessary to treat or prevent imminent or life-threatening deterioration.  Critical care was time spent personally by me on the following activities: development of treatment plan with patient and/or surrogate as well as nursing, discussions with consultants, evaluation of patient's response to treatment, examination of patient, obtaining history from patient or surrogate, ordering and performing treatments and interventions, ordering and review of laboratory studies, ordering and review of radiographic studies, pulse oximetry and re-evaluation of patient's condition. ____________________________________________   INITIAL IMPRESSION / ASSESSMENT AND PLAN / ED COURSE  Pertinent labs & imaging results that were available during my care of the patient were reviewed by me and considered in my medical decision making (see chart for details).  77 year old female with PMH as noted above presents with nausea and vomiting for the last several hours after eating macaroni and cheese for lunch today, with some coffee ground type material or blood in the vomitus, and epigastric abdominal pain.  She denies any blood in her stool.  I reviewed the past medical records in epic; was patient's most recent endoscopy in our system is from 2016 and shows polyps.    On exam, the vital signs are normal, the  patient is slightly uncomfortable but not acutely ill-appearing, and the remainder of the exam is as described above.  There is upper abdominal tenderness.  Overall I suspect most likely acute gastritis, early gastroenteritis, pancreatitis, or other hepatobiliary cause.  Given the patient's underlying gastric issues and prior upper GI bleeding, this may have caused acute bleeding.  At this time the patient's vital signs are stable and she does not require emergent [facility] or other anticoagulant reversal.  Plan: Symptomatic treatment with morphine and Zofran, IV Protonix, fluids, lab work-up, CT abdomen, and reassess.  Anticipate admission to the hospital.  ----------------------------------------- 11:17  PM on 04/04/2018 -----------------------------------------  The patient's lab work-up is unremarkable.  CT shows findings consistent with acute small bowel obstruction.  An NG tube was placed without complication.  The patient requires second dose of pain medication but is now more comfortable.  I consulted Dr. Peyton Najjar from surgery.  Disposition will be pending surgery recommendations.  I am signing the patient out to the oncoming physician Dr. Owens Shark. ____________________________________________   FINAL CLINICAL IMPRESSION(S) / ED DIAGNOSES  Final diagnoses:  None      NEW MEDICATIONS STARTED DURING THIS VISIT:  New Prescriptions   No medications on file     Note:  This document was prepared using Dragon voice recognition software and may include unintentional dictation errors.    Arta Silence, MD 04/04/18 2318

## 2018-04-05 ENCOUNTER — Inpatient Hospital Stay: Payer: Medicare Other

## 2018-04-05 ENCOUNTER — Other Ambulatory Visit: Payer: Self-pay

## 2018-04-05 DIAGNOSIS — J690 Pneumonitis due to inhalation of food and vomit: Secondary | ICD-10-CM | POA: Diagnosis not present

## 2018-04-05 DIAGNOSIS — J9602 Acute respiratory failure with hypercapnia: Secondary | ICD-10-CM | POA: Diagnosis not present

## 2018-04-05 DIAGNOSIS — K56609 Unspecified intestinal obstruction, unspecified as to partial versus complete obstruction: Secondary | ICD-10-CM | POA: Diagnosis not present

## 2018-04-05 DIAGNOSIS — N179 Acute kidney failure, unspecified: Secondary | ICD-10-CM | POA: Diagnosis not present

## 2018-04-05 DIAGNOSIS — G92 Toxic encephalopathy: Secondary | ICD-10-CM | POA: Diagnosis not present

## 2018-04-05 DIAGNOSIS — I130 Hypertensive heart and chronic kidney disease with heart failure and stage 1 through stage 4 chronic kidney disease, or unspecified chronic kidney disease: Secondary | ICD-10-CM | POA: Diagnosis present

## 2018-04-05 DIAGNOSIS — J9601 Acute respiratory failure with hypoxia: Secondary | ICD-10-CM | POA: Diagnosis not present

## 2018-04-05 DIAGNOSIS — I5033 Acute on chronic diastolic (congestive) heart failure: Secondary | ICD-10-CM | POA: Diagnosis present

## 2018-04-05 DIAGNOSIS — I25118 Atherosclerotic heart disease of native coronary artery with other forms of angina pectoris: Secondary | ICD-10-CM | POA: Diagnosis not present

## 2018-04-05 DIAGNOSIS — K56600 Partial intestinal obstruction, unspecified as to cause: Secondary | ICD-10-CM | POA: Diagnosis present

## 2018-04-05 DIAGNOSIS — R6521 Severe sepsis with septic shock: Secondary | ICD-10-CM | POA: Diagnosis not present

## 2018-04-05 DIAGNOSIS — I482 Chronic atrial fibrillation: Secondary | ICD-10-CM | POA: Diagnosis present

## 2018-04-05 DIAGNOSIS — J440 Chronic obstructive pulmonary disease with acute lower respiratory infection: Secondary | ICD-10-CM | POA: Diagnosis not present

## 2018-04-05 DIAGNOSIS — K317 Polyp of stomach and duodenum: Secondary | ICD-10-CM | POA: Diagnosis present

## 2018-04-05 DIAGNOSIS — N189 Chronic kidney disease, unspecified: Secondary | ICD-10-CM | POA: Diagnosis not present

## 2018-04-05 DIAGNOSIS — K921 Melena: Secondary | ICD-10-CM | POA: Diagnosis not present

## 2018-04-05 DIAGNOSIS — D6832 Hemorrhagic disorder due to extrinsic circulating anticoagulants: Secondary | ICD-10-CM | POA: Diagnosis present

## 2018-04-05 DIAGNOSIS — I4891 Unspecified atrial fibrillation: Secondary | ICD-10-CM | POA: Diagnosis not present

## 2018-04-05 DIAGNOSIS — Z6841 Body Mass Index (BMI) 40.0 and over, adult: Secondary | ICD-10-CM | POA: Diagnosis not present

## 2018-04-05 DIAGNOSIS — J9621 Acute and chronic respiratory failure with hypoxia: Secondary | ICD-10-CM | POA: Diagnosis not present

## 2018-04-05 DIAGNOSIS — I4892 Unspecified atrial flutter: Secondary | ICD-10-CM | POA: Diagnosis not present

## 2018-04-05 DIAGNOSIS — K922 Gastrointestinal hemorrhage, unspecified: Secondary | ICD-10-CM | POA: Diagnosis not present

## 2018-04-05 DIAGNOSIS — J9622 Acute and chronic respiratory failure with hypercapnia: Secondary | ICD-10-CM | POA: Diagnosis not present

## 2018-04-05 DIAGNOSIS — N289 Disorder of kidney and ureter, unspecified: Secondary | ICD-10-CM | POA: Diagnosis not present

## 2018-04-05 DIAGNOSIS — K920 Hematemesis: Secondary | ICD-10-CM | POA: Diagnosis present

## 2018-04-05 DIAGNOSIS — J150 Pneumonia due to Klebsiella pneumoniae: Secondary | ICD-10-CM | POA: Diagnosis not present

## 2018-04-05 DIAGNOSIS — E870 Hyperosmolality and hypernatremia: Secondary | ICD-10-CM | POA: Diagnosis not present

## 2018-04-05 DIAGNOSIS — D62 Acute posthemorrhagic anemia: Secondary | ICD-10-CM | POA: Diagnosis not present

## 2018-04-05 DIAGNOSIS — L97518 Non-pressure chronic ulcer of other part of right foot with other specified severity: Secondary | ICD-10-CM | POA: Diagnosis present

## 2018-04-05 DIAGNOSIS — I469 Cardiac arrest, cause unspecified: Secondary | ICD-10-CM | POA: Diagnosis not present

## 2018-04-05 DIAGNOSIS — I483 Typical atrial flutter: Secondary | ICD-10-CM | POA: Diagnosis not present

## 2018-04-05 DIAGNOSIS — F339 Major depressive disorder, recurrent, unspecified: Secondary | ICD-10-CM | POA: Diagnosis present

## 2018-04-05 DIAGNOSIS — I480 Paroxysmal atrial fibrillation: Secondary | ICD-10-CM | POA: Diagnosis present

## 2018-04-05 DIAGNOSIS — I481 Persistent atrial fibrillation: Secondary | ICD-10-CM | POA: Diagnosis not present

## 2018-04-05 DIAGNOSIS — X58XXXA Exposure to other specified factors, initial encounter: Secondary | ICD-10-CM | POA: Diagnosis present

## 2018-04-05 DIAGNOSIS — Z7901 Long term (current) use of anticoagulants: Secondary | ICD-10-CM | POA: Diagnosis not present

## 2018-04-05 DIAGNOSIS — A4159 Other Gram-negative sepsis: Secondary | ICD-10-CM | POA: Diagnosis not present

## 2018-04-05 DIAGNOSIS — I361 Nonrheumatic tricuspid (valve) insufficiency: Secondary | ICD-10-CM | POA: Diagnosis not present

## 2018-04-05 LAB — CBC WITH DIFFERENTIAL/PLATELET
BASOS ABS: 0 10*3/uL (ref 0–0.1)
Basophils Relative: 0 %
EOS ABS: 0 10*3/uL (ref 0–0.7)
EOS PCT: 0 %
HEMATOCRIT: 29.1 % — AB (ref 35.0–47.0)
Hemoglobin: 9.6 g/dL — ABNORMAL LOW (ref 12.0–16.0)
Lymphocytes Relative: 4 %
Lymphs Abs: 0.3 10*3/uL — ABNORMAL LOW (ref 1.0–3.6)
MCH: 32.9 pg (ref 26.0–34.0)
MCHC: 32.9 g/dL (ref 32.0–36.0)
MCV: 100 fL (ref 80.0–100.0)
MONO ABS: 0.8 10*3/uL (ref 0.2–0.9)
Monocytes Relative: 9 %
NEUTROS ABS: 7.2 10*3/uL — AB (ref 1.4–6.5)
Neutrophils Relative %: 87 %
PLATELETS: 176 10*3/uL (ref 150–440)
RBC: 2.92 MIL/uL — ABNORMAL LOW (ref 3.80–5.20)
RDW: 14.1 % (ref 11.5–14.5)
WBC: 8.3 10*3/uL (ref 3.6–11.0)

## 2018-04-05 LAB — HEMOGLOBIN AND HEMATOCRIT, BLOOD
HCT: 27.9 % — ABNORMAL LOW (ref 35.0–47.0)
HEMOGLOBIN: 9.2 g/dL — AB (ref 12.0–16.0)

## 2018-04-05 LAB — GLUCOSE, CAPILLARY
Glucose-Capillary: 234 mg/dL — ABNORMAL HIGH (ref 70–99)
Glucose-Capillary: 241 mg/dL — ABNORMAL HIGH (ref 70–99)
Glucose-Capillary: 224 mg/dL — ABNORMAL HIGH (ref 70–99)
Glucose-Capillary: 182 mg/dL — ABNORMAL HIGH (ref 70–99)
Glucose-Capillary: 169 mg/dL — ABNORMAL HIGH (ref 70–99)
Glucose-Capillary: 155 mg/dL — ABNORMAL HIGH (ref 70–99)

## 2018-04-05 LAB — TSH: TSH: 6.195 u[IU]/mL — ABNORMAL HIGH (ref 0.350–4.500)

## 2018-04-05 MED ORDER — PROMETHAZINE HCL 25 MG/ML IJ SOLN
12.5000 mg | Freq: Once | INTRAMUSCULAR | Status: AC
  Administered 2018-04-05: 12.5 mg via INTRAVENOUS
  Filled 2018-04-05: qty 1

## 2018-04-05 MED ORDER — PROMETHAZINE HCL 25 MG/ML IJ SOLN
12.5000 mg | INTRAMUSCULAR | Status: AC
  Administered 2018-04-05: 12.5 mg via INTRAVENOUS
  Filled 2018-04-05: qty 1

## 2018-04-05 MED ORDER — SODIUM CHLORIDE 0.9 % IV SOLN
8.0000 mg/h | INTRAVENOUS | Status: AC
  Administered 2018-04-05 – 2018-04-08 (×7): 8 mg/h via INTRAVENOUS
  Filled 2018-04-05 (×3): qty 80
  Filled 2018-04-05: qty 40
  Filled 2018-04-05 (×3): qty 80

## 2018-04-05 MED ORDER — INSULIN GLARGINE 100 UNIT/ML ~~LOC~~ SOLN
15.0000 [IU] | Freq: Every day | SUBCUTANEOUS | Status: DC
  Filled 2018-04-05: qty 0.15

## 2018-04-05 MED ORDER — PHENOL 1.4 % MT LIQD
1.0000 | OROMUCOSAL | Status: DC | PRN
  Filled 2018-04-05: qty 177

## 2018-04-05 MED ORDER — DOCUSATE SODIUM 100 MG PO CAPS
100.0000 mg | ORAL_CAPSULE | Freq: Two times a day (BID) | ORAL | Status: DC
  Filled 2018-04-05 (×2): qty 1

## 2018-04-05 MED ORDER — SODIUM CHLORIDE 0.9% IV SOLUTION
Freq: Once | INTRAVENOUS | Status: AC
  Administered 2018-04-06: 10 mL/h via INTRAVENOUS

## 2018-04-05 MED ORDER — FAMOTIDINE IN NACL 20-0.9 MG/50ML-% IV SOLN
20.0000 mg | Freq: Two times a day (BID) | INTRAVENOUS | Status: DC
Start: 2018-04-05 — End: 2018-04-05
  Administered 2018-04-05 (×2): 20 mg via INTRAVENOUS
  Filled 2018-04-05 (×2): qty 50

## 2018-04-05 MED ORDER — SODIUM CHLORIDE 0.9 % IV BOLUS
500.0000 mL | Freq: Once | INTRAVENOUS | Status: AC
  Administered 2018-04-05: 500 mL via INTRAVENOUS

## 2018-04-05 MED ORDER — ACETAMINOPHEN 650 MG RE SUPP
650.0000 mg | Freq: Four times a day (QID) | RECTAL | Status: DC | PRN
  Administered 2018-04-05 – 2018-04-07 (×2): 650 mg via RECTAL
  Filled 2018-04-05 (×2): qty 1

## 2018-04-05 MED ORDER — ONDANSETRON HCL 4 MG/2ML IJ SOLN
4.0000 mg | Freq: Four times a day (QID) | INTRAMUSCULAR | Status: DC | PRN
  Administered 2018-04-06 (×2): 4 mg via INTRAVENOUS
  Filled 2018-04-05 (×2): qty 2

## 2018-04-05 MED ORDER — INSULIN ASPART 100 UNIT/ML ~~LOC~~ SOLN
0.0000 [IU] | SUBCUTANEOUS | Status: DC
  Administered 2018-04-05: 3 [IU] via SUBCUTANEOUS
  Administered 2018-04-05: 2 [IU] via SUBCUTANEOUS
  Administered 2018-04-05 (×2): 3 [IU] via SUBCUTANEOUS
  Administered 2018-04-06: 5 [IU] via SUBCUTANEOUS
  Administered 2018-04-06: 2 [IU] via SUBCUTANEOUS
  Administered 2018-04-06: 3 [IU] via SUBCUTANEOUS
  Administered 2018-04-06: 5 [IU] via SUBCUTANEOUS
  Administered 2018-04-07: 1 [IU] via SUBCUTANEOUS
  Administered 2018-04-07 (×3): 3 [IU] via SUBCUTANEOUS
  Administered 2018-04-07: 2 [IU] via SUBCUTANEOUS
  Administered 2018-04-07: 3 [IU] via SUBCUTANEOUS
  Administered 2018-04-08: 2 [IU] via SUBCUTANEOUS
  Administered 2018-04-08: 3 [IU] via SUBCUTANEOUS
  Administered 2018-04-08: 2 [IU] via SUBCUTANEOUS
  Administered 2018-04-08: 1 [IU] via SUBCUTANEOUS
  Administered 2018-04-09 (×2): 2 [IU] via SUBCUTANEOUS
  Administered 2018-04-09: 1 [IU] via SUBCUTANEOUS
  Administered 2018-04-09: 2 [IU] via SUBCUTANEOUS
  Filled 2018-04-05 (×22): qty 1

## 2018-04-05 MED ORDER — ACETAMINOPHEN 325 MG PO TABS
650.0000 mg | ORAL_TABLET | Freq: Four times a day (QID) | ORAL | Status: DC | PRN
  Administered 2018-04-21: 650 mg via ORAL
  Filled 2018-04-05: qty 2

## 2018-04-05 MED ORDER — AMIODARONE HCL 100 MG PO TABS
200.0000 mg | ORAL_TABLET | Freq: Every day | ORAL | Status: DC
  Administered 2018-04-05: 200 mg
  Filled 2018-04-05: qty 2

## 2018-04-05 MED ORDER — MORPHINE SULFATE (PF) 4 MG/ML IV SOLN
3.0000 mg | INTRAVENOUS | Status: DC | PRN
  Administered 2018-04-05 – 2018-04-06 (×4): 3 mg via INTRAVENOUS
  Filled 2018-04-05 (×4): qty 1

## 2018-04-05 MED ORDER — SODIUM CHLORIDE 0.9 % IV SOLN
80.0000 mg | Freq: Once | INTRAVENOUS | Status: AC
  Administered 2018-04-05: 80 mg via INTRAVENOUS
  Filled 2018-04-05: qty 80

## 2018-04-05 MED ORDER — ONDANSETRON HCL 4 MG PO TABS: 4.0000 mg | ORAL_TABLET | Freq: Four times a day (QID) | ORAL | Status: DC | PRN

## 2018-04-05 MED ORDER — PROMETHAZINE HCL 25 MG/ML IJ SOLN: 12.5000 mg | Freq: Four times a day (QID) | INTRAMUSCULAR | Status: DC | PRN

## 2018-04-05 MED ORDER — SODIUM CHLORIDE 0.9 % IV SOLN
INTRAVENOUS | Status: DC
  Administered 2018-04-05 – 2018-04-09 (×11): via INTRAVENOUS

## 2018-04-05 NOTE — ED Provider Notes (Signed)
I assumed care of the patient from Dr. Cherylann Banas at 11:30 PM with plan that Dr. Peyton Najjar would evaluate the patient emergency department and ultimately admit.  Dr. Peyton Najjar evaluated patient and do that the patient would be better suited to be admitted to medicine given hematemesis as well.  Patient discussed with Dr. Jannifer [name] hospitalist on-call who will admit the patient.  Dr. Peyton Najjar gave the nurse a verbal order for Phenergan which the nurse was unable to order in the computer and as such as per Dr. Deniece Ree recommendation 12.5 mg of Phenergan was ordered.     Gregor Hams, MD 04/05/18 [id number]

## 2018-04-05 NOTE — Consult Note (Signed)
SURGICAL CONSULTATION NOTE   HISTORY OF PRESENT ILLNESS (HPI):  77 y.o. female presented to [facility] ED for evaluation of abdominal pain,nausea and vomiting. Patient reports has been having abdominal discomfort this past few day but since yesterday morning started with worse pain. Patient also refers multiple episodes of nausea and vomiting. Pain is generalized. No pain radiation out of the abdomen. Patient refers has been vomiting blood. Cannot associate an aggravator or alleviator factor. Upon arrival to the ED patient had labs done which does not shows leukocytosis, normal hemoglobin, stable creatinine, no significant electrolyte disturbance. CT scan was done showing sings of small bowel obstruction. Patient has had multiple previous episode of bowel obstruction. Patient with surgical history of cholecystectomy, appendectomy and sigmoidectomy. This episode is different from other bowel obstruction due to the upper GI bleeding. Patient denies passing flatus today.   Patient has a complex medical history including atrial fibrillation on anticoagulation, CHF, sinus node dysfunction with pacemaker, chronic kidney disease. Patient also with history of bleeding gastric polyps treated by Dr. Gustavo Lah on 2016 and erosive gastritis.   Surgery is consulted by Dr. Cherylann Banas in this context for evaluation and management of small bowel obstruction.  PAST MEDICAL HISTORY (PMH):  Past Medical History:  Diagnosis Date  . A-fib ([place]) 2016  . Acid reflux   . Anxiety   . Bowel obstruction ([place])   . CHF (congestive heart failure) ([place]) 2017  . Chronic kidney disease (CKD) stage G3a/A1, moderately decreased glomerular filtration rate (GFR) between 45-59 mL/min/1.73 square meter and albuminuria creatinine ratio less than 30 mg/g (HCC)   . Diabetes mellitus without complication ([place])   . Dyspnea   . Fatty liver   . GI bleed   . Gout   . Hypertension   . Morbid obesity ([place])   . Osteoarthritis   . Osteoporosis    . Paroxysmal atrial fibrillation (HCC)   . Presence of permanent cardiac pacemaker   . Sinus node dysfunction ([place]) 08/24/2017  . TIA (transient ischemic attack)      PAST SURGICAL HISTORY ([place]):  Past Surgical History:  Procedure Laterality Date  . ABDOMINAL HYSTERECTOMY  1980  . APPENDECTOMY    . BACK SURGERY     2008  . BREAST BIOPSY Right 08/16   stereo fibroadenomatous change, neg for atypia  . BREAST EXCISIONAL BIOPSY Left    neg  . CARDIAC CATHETERIZATION Left 08/25/2016   Procedure: Left Heart Cath and Coronary Angiography;  Surgeon: Corey Skains, MD;  Location: [facility];  Service: Cardiovascular;  Laterality: Left;  . CARDIOVERSION N/A 06/28/2017   Procedure: CARDIOVERSION;  Surgeon: Corey Skains, MD;  Location: [facility];  Service: Cardiovascular;  Laterality: N/A;  . CARDIOVERSION N/A 02/06/2018   Procedure: CARDIOVERSION;  Surgeon: Josue Hector, MD;  Location: [facility];  Service: Cardiovascular;  Laterality: N/A;  . CATARACT EXTRACTION, BILATERAL  2012  . CHOLECYSTECTOMY    . colon blockage  1999  . COLON SURGERY  1999  . COLONOSCOPY  2016   polyps removed 2016  . DORSAL COMPARTMENT RELEASE Left 09/14/2016   Procedure: RELEASE DORSAL COMPARTMENT ([place]);  Surgeon: Dereck Leep, MD;  Location: [facility];  Service: Orthopedics;  Laterality: Left;  . ESOPHAGOGASTRODUODENOSCOPY N/A 01/27/2015   Procedure: ESOPHAGOGASTRODUODENOSCOPY (EGD);  Surgeon: Lollie Sails, MD;  Location: [facility];  Service: Endoscopy;  Laterality: N/A;  . ESOPHAGOGASTRODUODENOSCOPY (EGD) WITH PROPOFOL N/A 07/24/2015   Procedure: ESOPHAGOGASTRODUODENOSCOPY (EGD) WITH PROPOFOL;  Surgeon: Lucilla Lame, MD;  Location: [facility];  Service: Endoscopy;  Laterality: N/A;  . JOINT REPLACEMENT  2014   Bilateral Knee replacement  . LEAD REVISION/REPAIR N/A 08/29/2017   Procedure: LEAD REVISION/REPAIR;  Surgeon: Constance Haw, MD;  Location: [facility];  Service: Cardiovascular;  Laterality: N/A;  . PACEMAKER INSERTION Left 07/01/2015   Procedure: INSERTION PACEMAKER;  Surgeon: Isaias Cowman, MD;  Location: [facility];  Service: Cardiovascular;  Laterality: Left;  . PACEMAKER REVISION N/A 08/28/2017   Procedure: PACEMAKER REVISION;  Surgeon: Deboraha Sprang, MD;  Location: [facility];  Service: Cardiovascular;  Laterality: N/A;  . REPLACEMENT TOTAL KNEE BILATERAL    . SHOULDER ARTHROSCOPY WITH SUBACROMIAL DECOMPRESSION Left 2013  . TEE WITHOUT CARDIOVERSION N/A 06/28/2017   Procedure: TRANSESOPHAGEAL ECHOCARDIOGRAM (TEE);  Surgeon: Corey Skains, MD;  Location: [facility];  Service: Cardiovascular;  Laterality: N/A;     MEDICATIONS:  Prior to Admission medications   Medication Sig Start Date End Date Taking? Authorizing Provider  acetaminophen (TYLENOL) 500 MG tablet Take 1,000 mg by mouth every 6 (six) hours as needed for mild pain or moderate pain.    Yes [provider]  allopurinol (ZYLOPRIM) 100 MG tablet Take 150 mg by mouth daily.    Yes [provider]  amiodarone (PACERONE) 400 MG tablet Take 1/2 tablet (200 mg) by mouth once daily as directed 03/20/18  Yes Deboraha Sprang, MD  apixaban (ELIQUIS) 5 MG TABS tablet Take 1 tablet (5 mg total) by mouth 2 (two) times daily. 03/05/18  Yes Deboraha Sprang, MD  busPIRone (BUSPAR) 10 MG tablet Take 10 mg by mouth 2 (two) times daily.   Yes [provider]  cholecalciferol (VITAMIN D) 1000 units tablet Take 1,000 Units by mouth daily.   Yes [provider]  colchicine 0.6 MG tablet Take 0.6 mg by mouth daily as needed (gout).    Yes [provider]  docusate sodium (COLACE) 100 MG capsule Take 100 mg by mouth at bedtime.    Yes [provider]  insulin NPH-regular Human (NOVOLIN 70/30) (70-30) 100 UNIT/ML injection Inject 22 Units into the skin 2 (two) times daily before a meal.    Yes [provider]  ketoconazole  (NIZORAL) 2 % cream Apply 1 application topically every 2 (two) hours as needed (for rash under breasts).    Yes [provider]  oxybutynin (DITROPAN) 5 MG tablet Take 0.5 tablets (2.5 mg total) by mouth at bedtime. Patient taking differently: Take 10 mg by mouth 2 (two) times daily.  06/13/17  Yes Epifanio Lesches, MD  pantoprazole (PROTONIX) 40 MG tablet Take 1 tablet (40 mg total) by mouth 2 (two) times daily. 12/29/17  Yes Darel Hong, MD  PARoxetine (PAXIL) 20 MG tablet Take 20 mg by mouth daily.   Yes [provider]  Polyethyl Glycol-Propyl Glycol (SYSTANE OP) Apply 1 drop to eye 2 (two) times daily as needed (dry eyes).    Yes [provider]  pravastatin (PRAVACHOL) 20 MG tablet Take 20 mg by mouth at bedtime.    Yes [provider]  talc (ZEASORB) powder Apply 1 application topically as needed (under the breast irritation).   Yes [provider]  traZODone (DESYREL) 50 MG tablet Take 1 tablet (50 mg total) by mouth at bedtime. 06/13/17  Yes Epifanio Lesches, MD  furosemide (LASIX) 20 MG tablet Take 1 tablet (20 mg total) by mouth daily. Patient not taking: Reported on 04/04/2018 02/07/18  Hosie Poisson, MD  HYDROcodone-acetaminophen (NORCO/VICODIN) 5-325 MG tablet Take 1 tablet by mouth every 6 (six) hours as needed for moderate pain. Patient not taking: Reported on 04/04/2018 08/30/17 08/30/18  Baldwin Jamaica, PA-C  hydrocortisone 2.5 % cream Apply 1 application topically daily as needed (rash).    [provider]     ALLERGIES:  Allergies  Allergen Reactions  . Cephalosporins Other (See Comments)    Reaction:  Unknown   . Macrolides And Ketolides Other (See Comments)    Reaction:  Unknown   . Meperidine Shortness Of Breath, Nausea Only and Other (See Comments)    Reaction:  Stomach pain   . Prednisone Anaphylaxis and Other (See Comments)    Reaction:  Increases pts blood sugar  Pt states that she is allergic to  all steroids.    . Shellfish Allergy Anaphylaxis, Shortness Of Breath, Diarrhea, Nausea Only and Rash    Reaction:  Stomach pain   . Sulfa Antibiotics Shortness Of Breath, Diarrhea, Nausea Only and Other (See Comments)    Reaction:  Stomach pain   . Uloric [Febuxostat] Anaphylaxis    Locks pt's body up   . Aspirin Other (See Comments)    Reaction:  Stomach pain   . Celecoxib Other (See Comments)    Reaction:  GI bleed, weakness, and stomach pain.    . Cephalexin Diarrhea, Nausea Only and Other (See Comments)    Reaction:  Stomach pain   . Erythromycin Diarrhea, Nausea Only and Other (See Comments)    Reaction:  Stomach pain   . Hydromorphone Other (See Comments)    Reaction:  Hypotension   . Iodinated Diagnostic Agents Rash and Other (See Comments)    Pt states that she is unable to have because she has chronic kidney disease.    Marland Kitchen Oxycodone Other (See Comments)    Reaction:  Stomach pain   . Atorvastatin Nausea Only and Other (See Comments)    Reaction:  Weakness   . Codeine Diarrhea, Nausea Only and Other (See Comments)    Reaction:  Stomach pain Pt tolerates morphine   . Doxycycline     Stomach pain   . Tape Other (See Comments)    Reaction:  Causes pts skin to tear  Pt states that she is able to use paper tape.    . Valdecoxib Swelling and Other (See Comments)    Pt states that her hands and feet swell.    . Iodine Rash     SOCIAL HISTORY:  Social History   Socioeconomic History  . Marital status: Married    Spouse name: Not on file  . Number of children: Not on file  . Years of education: Not on file  . Highest education level: Not on file  Occupational History  . Occupation: retired  Scientific laboratory technician  . Financial resource strain: Not on file  . Food insecurity:    Worry: Not on file    Inability: Not on file  . Transportation needs:    Medical: Not on file    Non-medical: Not on file  Tobacco Use  . Smoking status: Never Smoker  . Smokeless tobacco: Never  Used  Substance and Sexual Activity  . Alcohol use: No  . Drug use: No  . Sexual activity: Yes  Lifestyle  . Physical activity:    Days per week: Not on file    Minutes per session: Not on file  . Stress: Not on file  Relationships  . Social  connections:    Talks on phone: Not on file    Gets together: Not on file    Attends religious service: Not on file    Active member of club or organization: Not on file    Attends meetings of clubs or organizations: Not on file    Relationship status: Not on file  . Intimate partner violence:    Fear of current or ex partner: Not on file    Emotionally abused: Not on file    Physically abused: Not on file    Forced sexual activity: Not on file  Other Topics Concern  . Not on file  Social History Narrative  . Not on file    The patient currently resides (home / rehab facility / nursing home): Home The patient normally is (ambulatory / bedbound): Ambulatory   FAMILY HISTORY:  Family History  Problem Relation Age of Onset  . Diabetes Mellitus II Mother   . CAD Mother   . Heart attack Mother   . Cancer Father        skin  . Breast cancer Neg Hx      REVIEW OF SYSTEMS:  Constitutional: denies weight loss, fever, chills, or sweats  Eyes: denies any other vision changes, history of eye injury  ENT: denies sore throat, hearing problems  Respiratory: denies shortness of breath, wheezing  Cardiovascular: denies chest pain, palpitations  Gastrointestinal: positive abdominal pain, N/V. Positive for upper GI bleeding  Genitourinary: denies burning with urination or urinary frequency Musculoskeletal: denies any other joint pains or cramps  Skin: denies any other rashes or skin discolorations  Neurological: denies any other headache, dizziness, weakness  Psychiatric: denies any other depression, anxiety   All other review of systems were negative   VITAL SIGNS:  Temp:  [97.7 F (36.5 C)] 97.7 F (36.5 C) (07/24 2028) Pulse Rate:   [46-77] 65 (07/24 2215) Resp:  [15-28] 20 (07/25 0115) BP: (98-154)/(44-118) 127/44 (07/25 0115) SpO2:  [90 %-100 %] 100 % (07/24 2215) Weight:  [110.7 kg (244 lb)] 110.7 kg (244 lb) (07/24 2025)     Height: 5' 4.5" (163.8 cm) Weight: 110.7 kg (244 lb) BMI (Calculated): 41.25   INTAKE/OUTPUT:  This shift: No intake/output data recorded.  Last 2 shifts: @IOLAST2SHIFTS @   PHYSICAL EXAM:  Constitutional:  -- Normal body habitus, obese  -- Awake, alert, and oriented x3  Eyes:  -- Pupils equally round and reactive to light  -- No scleral icterus  Ear, nose, and throat:  -- No jugular venous distension  Pulmonary:  -- No crackles  -- Equal breath sounds bilaterally -- Breathing non-labored at rest Cardiovascular:  -- S1, S2 present  -- No pericardial rubs Gastrointestinal:  -- Abdomen soft, mild to moderate tender generalized, distended, no guarding or rebound tenderness. No bowel sound heard -- No abdominal masses appreciated, pulsatile or otherwise  Musculoskeletal and Integumentary:  -- Wounds or skin discoloration: None appreciated -- Extremities: B/L UE and LE FROM, hands and feet warm, no edema  Neurologic:  -- Motor function: intact and symmetric -- Sensation: intact and symmetric   Labs:  CBC Latest Ref Rng & Units 04/04/2018 02/05/2018 02/04/2018  WBC 3.6 - 11.0 K/uL 8.2 8.6 5.0  Hemoglobin 12.0 - 16.0 g/dL 13.4 13.7 14.4  Hematocrit 35.0 - 47.0 % 39.4 43.4 43.0  Platelets 150 - 440 K/uL 218 210 213   CMP Latest Ref Rng & Units 04/04/2018 03/30/2018 03/20/2018  Glucose 70 - 99 mg/dL 102(H) 189(H) 188(H)  BUN 8 - 23 mg/dL 22 22 40(H)  Creatinine 0.44 - 1.00 mg/dL 1.53(H) 1.87(H) 1.90(H)  Sodium 135 - 145 mmol/L 143 138 136  Potassium 3.5 - 5.1 mmol/L 4.2 4.3 4.2  Chloride 98 - 111 mmol/L 109 105 100  CO2 22 - 32 mmol/L 25 21(L) 25  Calcium 8.9 - 10.3 mg/dL 9.6 8.5(L) 8.9  Total Protein 6.5 - 8.1 g/dL 7.4 - 7.4  Total Bilirubin 0.3 - 1.2 mg/dL 1.4(H) - 1.1  Alkaline  Phos 38 - 126 U/L 92 - 93  AST 15 - 41 U/L 39 - 35  ALT 0 - 44 U/L 19 - 16   Imaging studies:  EXAM: CT ABDOMEN AND PELVIS WITHOUT CONTRAST  TECHNIQUE: Multidetector CT imaging of the abdomen and pelvis was performed following the standard protocol without IV contrast.  COMPARISON:  01/25/2016  FINDINGS: Lower chest: Lung bases are clear. Cardiac enlargement. Coronary artery calcifications.  Hepatobiliary: No focal liver abnormality is seen. Status post cholecystectomy. No biliary dilatation.  Pancreas: Unremarkable. No pancreatic ductal dilatation or surrounding inflammatory changes.  Spleen: Normal in size without focal abnormality.  Adrenals/Urinary Tract: Adrenal glands are unremarkable. Kidneys are normal, without renal calculi, focal lesion, or hydronephrosis. Bladder is unremarkable.  Stomach/Bowel: Stomach is moderately distended with what appears to be ingested material. No gastric wall thickening. Mid and distal small bowel are moderately dilated and fluid-filled. No wall thickening is appreciated. Distal small bowel are decompressed with transition zone likely in the mid pelvis. Changes are likely to represent small bowel obstruction. Cause is not determined. Scattered stool throughout the colon. No colonic wall thickening or infiltrative changes. Appendix is surgically absent. Anastomosis at the rectosigmoid junction appears patent. Surgical clips in the pelvis.  Vascular/Lymphatic: Aortic atherosclerosis. No enlarged abdominal or pelvic lymph nodes.  Reproductive: Status post hysterectomy. No adnexal masses.  Other: No abdominal wall hernia or abnormality. No abdominopelvic ascites.  Musculoskeletal: Degenerative changes in the spine. Postoperative change with posterior rod and screw fixation from L4 to the sacrum.  IMPRESSION: Dilated fluid-filled small bowel with transition zone in the mid pelvis consistent with small bowel  obstruction. Cause is not identified.  Aortic atherosclerosis.   Electronically Signed   By: Lucienne Capers M.D.   On: 04/04/2018 21:33  Assessment/Plan:  77 y.o. female with small bowel obstruction and upper GI bleeding, complicated by pertinent comorbidities including atrial fibrillation on anticoagulation, CHF, sinus node dysfunction with pacemaker, chronic kidney disease, morbid obesity, history of bleeding gastric polyps.  Patient's small bowel obstruction most likely due to post operative adhesions. I recommend to start with conservative management. NGT already in place, NPO, IVF's, keep electrolytes stable and follow with physical exam and images.   Patient also need evaluation for upper GI bleeding. I recommend to hold anticoagulation. At this moment the hemoglobin is normal.  Will appreciate admission by Hospitalist for management significant chronic comorbidities and evaluation of upper GI bleeding. I will follow patient small bowel obstruction.   Arnold Long, MD

## 2018-04-05 NOTE — Progress Notes (Signed)
Patient ID: Deborah Obrien, female   DOB: 02/19/1941, 77 y.o.   MRN: [id number]     [place] [facility] Day(s): 0.   Post op day(s):  Marland Kitchen   Interval History: Patient seen and examined. Patient continue with hematemesis, actively vomiting blood during my evaluation. NGT was not working properly. Tried to flush it but it was clogged. Ordered to change for a new one. Patient refers continue with abdominal pain.   Vital signs in last 24 hours: [min-max] current  Temp:  [97.6 F (36.4 C)-98.7 F (37.1 C)] 97.6 F (36.4 C) (07/25 0936) Pulse Rate:  [46-77] 60 (07/25 0936) Resp:  [15-28] 18 (07/25 0936) BP: (98-154)/(35-118) 116/35 (07/25 0936) SpO2:  [90 %-100 %] 100 % (07/25 0936) Weight:  [110.6 kg (243 lb 13.3 oz)-110.7 kg (244 lb)] 110.6 kg (243 lb 13.3 oz) (07/25 0330)     Height: 5\' 8"  (172.7 cm) Weight: 110.6 kg (243 lb 13.3 oz) BMI (Calculated): 37.08   Physical Exam:  Constitutional: alert, cooperative and no distress  Respiratory: breathing non-labored at rest  Cardiovascular: irregular rate and sinus rhythm  Gastrointestinal: soft, moderate-tender, and distended. No guarding or rebound tenderness.   Labs:  CBC Latest Ref Rng & Units 04/05/2018 04/04/2018 02/05/2018  WBC 3.6 - 11.0 K/uL 8.3 8.2 8.6  Hemoglobin 12.0 - 16.0 g/dL 9.6(L) 13.4 13.7  Hematocrit 35.0 - 47.0 % 29.1(L) 39.4 43.4  Platelets 150 - 440 K/uL 176 218 210   CMP Latest Ref Rng & Units 04/04/2018 03/30/2018 03/20/2018  Glucose 70 - 99 mg/dL 102(H) 189(H) 188(H)  BUN 8 - 23 mg/dL 22 22 40(H)  Creatinine 0.44 - 1.00 mg/dL 1.53(H) 1.87(H) 1.90(H)  Sodium 135 - 145 mmol/L 143 138 136  Potassium 3.5 - 5.1 mmol/L 4.2 4.3 4.2  Chloride 98 - 111 mmol/L 109 105 100  CO2 22 - 32 mmol/L 25 21(L) 25  Calcium 8.9 - 10.3 mg/dL 9.6 8.5(L) 8.9  Total Protein 6.5 - 8.1 g/dL 7.4 - 7.4  Total Bilirubin 0.3 - 1.2 mg/dL 1.4(H) - 1.1  Alkaline Phos 38 - 126 U/L 92 - 93  AST 15 - 41 U/L 39 - 35  ALT 0 - 44 U/L 19 -  16    Assessment/Plan:  77 y.o. female with small bowel obstruction and upper GI bleeding, complicated by pertinent comorbidities including atrial fibrillation on anticoagulation, CHF, sinus node dysfunction with pacemaker, chronic kidney disease, morbid obesity, history of bleeding gastric polyps.  Patient continue with active vomiting dark and bright red blood. Patient with history of multiple gastric polyp with bleeding previously resected by endoscopy. High risk for recurrence.   Will continue conservative management of small bowel obstruction.   Arnold Long, MD

## 2018-04-05 NOTE — Consult Note (Signed)
Deborah Lame, MD [facility]  [address], [place], [place] [address] Phone: [phone] Fax : [phone]  Consultation  Referring Provider:     Dr. Marcille Blanco Primary Care Physician:  [name], MD Primary Gastroenterologist:  Dr. Allen Norris Reason for Consultation:     Bowel obstruction with hematemesis  Date of Admission:  04/04/2018 Date of Consultation:  04/05/2018         HPI:   Deborah Obrien is a 77 y.o. female who was admitted with nausea vomiting and found to have small bowel obstruction.  The patient is being followed by surgery for this.  The patient has been reports that the patient had vomited multiple times and then after vomiting multiple times she had some blood with her vomitus.  She had a upper endoscopy by me back in 2016 for abdominal pain and had multiple gastric polyps that were biopsied and were benign.  The patient has been on Eliquis that was started by her cardiologist.  The patient had an NG tube placed with dark material coming out of the NG tube.  The patient's baseline hemoglobin has been around 13.5.  The patient's hemoglobin yesterday was 13.4 and today is 9.6. There is no report of the patient taking any Advil Aleve Motrin BC powders or Goody powders.  The patient takes Tylenol sometimes for pain.  She has had the Eliquis stopped with her last dose being yesterday.  Past Medical History:  Diagnosis Date  . A-fib ([place]) 2016  . Acid reflux   . Anxiety   . Bowel obstruction ([place])   . CHF (congestive heart failure) ([place]) 2017  . Chronic kidney disease (CKD) stage G3a/A1, moderately decreased glomerular filtration rate (GFR) between 45-59 mL/min/1.73 square meter and albuminuria creatinine ratio less than 30 mg/g (HCC)   . Diabetes mellitus without complication ([place])   . Dyspnea   . Fatty liver   . GI bleed   . Gout   . Hypertension   . Morbid obesity ([place])   . Osteoarthritis   . Osteoporosis   . Paroxysmal atrial fibrillation (HCC)   .  Presence of permanent cardiac pacemaker   . Sinus node dysfunction ([place]) 08/24/2017  . TIA (transient ischemic attack)     Past Surgical History:  Procedure Laterality Date  . ABDOMINAL HYSTERECTOMY  1980  . APPENDECTOMY    . BACK SURGERY     2008  . BREAST BIOPSY Right 08/16   stereo fibroadenomatous change, neg for atypia  . BREAST EXCISIONAL BIOPSY Left    neg  . CARDIAC CATHETERIZATION Left 08/25/2016   Procedure: Left Heart Cath and Coronary Angiography;  Surgeon: Corey Skains, MD;  Location: [place] [facility];  Service: Cardiovascular;  Laterality: Left;  . CARDIOVERSION N/A 06/28/2017   Procedure: CARDIOVERSION;  Surgeon: Corey Skains, MD;  Location: [facility];  Service: Cardiovascular;  Laterality: N/A;  . CARDIOVERSION N/A 02/06/2018   Procedure: CARDIOVERSION;  Surgeon: Josue Hector, MD;  Location: [place];  Service: Cardiovascular;  Laterality: N/A;  . CATARACT EXTRACTION, BILATERAL  2012  . CHOLECYSTECTOMY    . colon blockage  1999  . COLON SURGERY  1999  . COLONOSCOPY  2016   polyps removed 2016  . DORSAL COMPARTMENT RELEASE Left 09/14/2016   Procedure: RELEASE DORSAL COMPARTMENT (DEQUERVAIN);  Surgeon: Dereck Leep, MD;  Location: [facility];  Service: Orthopedics;  Laterality: Left;  . ESOPHAGOGASTRODUODENOSCOPY N/A 01/27/2015   Procedure: ESOPHAGOGASTRODUODENOSCOPY (EGD);  Surgeon: Billie Ruddy  Gustavo Lah, MD;  Location: [facility];  Service: Endoscopy;  Laterality: N/A;  . ESOPHAGOGASTRODUODENOSCOPY (EGD) WITH PROPOFOL N/A 07/24/2015   Procedure: ESOPHAGOGASTRODUODENOSCOPY (EGD) WITH PROPOFOL;  Surgeon: Deborah Lame, MD;  Location: [facility];  Service: Endoscopy;  Laterality: N/A;  . JOINT REPLACEMENT  2014   Bilateral Knee replacement  . LEAD REVISION/REPAIR N/A 08/29/2017   Procedure: LEAD REVISION/REPAIR;  Surgeon: Constance Haw, MD;  Location: [place] [facility];  Service: Cardiovascular;  Laterality: N/A;  . PACEMAKER INSERTION Left  07/01/2015   Procedure: INSERTION PACEMAKER;  Surgeon: Isaias Cowman, MD;  Location: [facility];  Service: Cardiovascular;  Laterality: Left;  . PACEMAKER REVISION N/A 08/28/2017   Procedure: PACEMAKER REVISION;  Surgeon: Deboraha Sprang, MD;  Location: [place] [facility];  Service: Cardiovascular;  Laterality: N/A;  . REPLACEMENT TOTAL KNEE BILATERAL    . SHOULDER ARTHROSCOPY WITH SUBACROMIAL DECOMPRESSION Left 2013  . TEE WITHOUT CARDIOVERSION N/A 06/28/2017   Procedure: TRANSESOPHAGEAL ECHOCARDIOGRAM (TEE);  Surgeon: Corey Skains, MD;  Location: [facility];  Service: Cardiovascular;  Laterality: N/A;    Prior to Admission medications   Medication Sig Start Date End Date Taking? Authorizing Provider  acetaminophen (TYLENOL) 500 MG tablet Take 1,000 mg by mouth every 6 (six) hours as needed for mild pain or moderate pain.    Yes [provider]  allopurinol (ZYLOPRIM) 100 MG tablet Take 150 mg by mouth daily.    Yes [provider]  amiodarone (PACERONE) 400 MG tablet Take 1/2 tablet (200 mg) by mouth once daily as directed 03/20/18  Yes Deboraha Sprang, MD  apixaban (ELIQUIS) 5 MG TABS tablet Take 1 tablet (5 mg total) by mouth 2 (two) times daily. 03/05/18  Yes Deboraha Sprang, MD  busPIRone (BUSPAR) 10 MG tablet Take 10 mg by mouth 2 (two) times daily.   Yes [provider]  cholecalciferol (VITAMIN D) 1000 units tablet Take 1,000 Units by mouth daily.   Yes [provider]  colchicine 0.6 MG tablet Take 0.6 mg by mouth daily as needed (gout).    Yes [provider]  docusate sodium (COLACE) 100 MG capsule Take 100 mg by mouth at bedtime.    Yes [provider]  insulin NPH-regular Human (NOVOLIN 70/30) (70-30) 100 UNIT/ML injection Inject 22 Units into the skin 2 (two) times daily before a meal.    Yes [provider]  ketoconazole (NIZORAL) 2 % cream Apply 1 application topically every 2 (two) hours as needed (for rash  under breasts).    Yes [provider]  oxybutynin (DITROPAN) 5 MG tablet Take 0.5 tablets (2.5 mg total) by mouth at bedtime. Patient taking differently: Take 10 mg by mouth 2 (two) times daily.  06/13/17  Yes Epifanio Lesches, MD  pantoprazole (PROTONIX) 40 MG tablet Take 1 tablet (40 mg total) by mouth 2 (two) times daily. 12/29/17  Yes Darel Hong, MD  PARoxetine (PAXIL) 20 MG tablet Take 20 mg by mouth daily.   Yes [provider]  Polyethyl Glycol-Propyl Glycol (SYSTANE OP) Apply 1 drop to eye 2 (two) times daily as needed (dry eyes).    Yes [provider]  pravastatin (PRAVACHOL) 20 MG tablet Take 20 mg by mouth at bedtime.    Yes [provider]  talc (ZEASORB) powder Apply 1 application topically as needed (under the breast irritation).   Yes [provider]  traZODone (DESYREL) 50 MG tablet Take 1 tablet (50 mg total) by mouth at bedtime.  06/13/17  Yes Epifanio Lesches, MD  furosemide (LASIX) 20 MG tablet Take 1 tablet (20 mg total) by mouth daily. Patient not taking: Reported on 04/04/2018 02/07/18   Hosie Poisson, MD  HYDROcodone-acetaminophen (NORCO/VICODIN) 5-325 MG tablet Take 1 tablet by mouth every 6 (six) hours as needed for moderate pain. Patient not taking: Reported on 04/04/2018 08/30/17 08/30/18  Baldwin Jamaica, PA-C  hydrocortisone 2.5 % cream Apply 1 application topically daily as needed (rash).    [provider]    Family History  Problem Relation Age of Onset  . Diabetes Mellitus II Mother   . CAD Mother   . Heart attack Mother   . Cancer Father        skin  . Breast cancer Neg Hx      Social History   Tobacco Use  . Smoking status: Never Smoker  . Smokeless tobacco: Never Used  Substance Use Topics  . Alcohol use: No  . Drug use: No    Allergies as of 04/04/2018 - Review Complete 04/04/2018  Allergen Reaction Noted  . Cephalosporins Other (See Comments) 01/19/2015  . Macrolides and  ketolides Other (See Comments) 01/19/2015  . Meperidine Shortness Of Breath, Nausea Only, and Other (See Comments) 01/19/2015  . Prednisone Anaphylaxis and Other (See Comments) 01/19/2015  . Shellfish allergy Anaphylaxis, Shortness Of Breath, Diarrhea, Nausea Only, and Rash 06/29/2015  . Sulfa antibiotics Shortness Of Breath, Diarrhea, Nausea Only, and Other (See Comments) 01/19/2015  . Uloric [febuxostat] Anaphylaxis 08/22/2016  . Aspirin Other (See Comments) 01/19/2015  . Celecoxib Other (See Comments) 01/19/2015  . Cephalexin Diarrhea, Nausea Only, and Other (See Comments) 01/19/2015  . Erythromycin Diarrhea, Nausea Only, and Other (See Comments) 01/19/2015  . Hydromorphone Other (See Comments) 01/19/2015  . Iodinated diagnostic agents Rash and Other (See Comments) 01/19/2015  . Oxycodone Other (See Comments) 01/19/2015  . Atorvastatin Nausea Only and Other (See Comments) 01/19/2015  . Codeine Diarrhea, Nausea Only, and Other (See Comments) 01/19/2015  . Doxycycline  08/24/2017  . Tape Other (See Comments) 06/29/2015  . Valdecoxib Swelling and Other (See Comments) 01/19/2015  . Iodine Rash 06/29/2015    Review of Systems:    All systems reviewed and negative except where noted in HPI.   Physical Exam:  Vital signs in last 24 hours: Temp:  [97.6 F (36.4 C)-98.7 F (37.1 C)] 97.8 F (36.6 C) (07/25 1300) Pulse Rate:  [46-77] 59 (07/25 1300) Resp:  [15-28] 17 (07/25 1300) BP: (98-154)/(35-118) 114/39 (07/25 1300) SpO2:  [90 %-100 %] 99 % (07/25 1300) Weight:  [243 lb 13.3 oz (110.6 kg)-244 lb (110.7 kg)] 243 lb 13.3 oz (110.6 kg) (07/25 0330)   General:   Pleasant, cooperative in NAD Head:  Normocephalic and atraumatic. Eyes:   No icterus.   Conjunctiva pink. PERRLA. Ears:  Normal auditory acuity. Neck:  Supple; no masses or thyroidomegaly Lungs: Respirations even and unlabored. Lungs clear to auscultation bilaterally.   No wheezes, crackles, or rhonchi.  Heart:  Regular  rate and rhythm;  Without murmur, clicks, rubs or gallops Abdomen:  Soft, nondistended, nontender. Normal bowel sounds. No appreciable masses or hepatomegaly.  No rebound or guarding.  Rectal:  Not performed. Msk:  Symmetrical without gross deformities.   Extremities:  Without edema, cyanosis or clubbing. Neurologic: Lethargic but shakes her head yes and no to questions   grossly normal neurologically. Skin:  Intact without significant lesions or rashes. Cervical Nodes:  No significant cervical adenopathy. Psych:  Alert and cooperative. Normal  affect.  LAB RESULTS: Recent Labs    04/04/18 2027 04/05/18 0924  WBC 8.2 8.3  HGB 13.4 9.6*  HCT 39.4 29.1*  PLT 218 176   BMET Recent Labs    04/04/18 2027  NA 143  K 4.2  CL 109  CO2 25  GLUCOSE 102*  BUN 22  CREATININE 1.53*  CALCIUM 9.6   LFT Recent Labs    04/04/18 2027  PROT 7.4  ALBUMIN 4.0  AST 39  ALT 19  ALKPHOS 92  BILITOT 1.4*   PT/INR Recent Labs    04/04/18 2027  LABPROT 13.8  INR 1.07    STUDIES: Ct Abdomen Pelvis Wo Contrast  Result Date: 04/04/2018 CLINICAL DATA:  Abdominal pain and vomiting. History of TIA and on Eliquis. History of appendectomy, cholecystectomy, and hysterectomy. EXAM: CT ABDOMEN AND PELVIS WITHOUT CONTRAST TECHNIQUE: Multidetector CT imaging of the abdomen and pelvis was performed following the standard protocol without IV contrast. COMPARISON:  01/25/2016 FINDINGS: Lower chest: Lung bases are clear. Cardiac enlargement. Coronary artery calcifications. Hepatobiliary: No focal liver abnormality is seen. Status post cholecystectomy. No biliary dilatation. Pancreas: Unremarkable. No pancreatic ductal dilatation or surrounding inflammatory changes. Spleen: Normal in size without focal abnormality. Adrenals/Urinary Tract: Adrenal glands are unremarkable. Kidneys are normal, without renal calculi, focal lesion, or hydronephrosis. Bladder is unremarkable. Stomach/Bowel: Stomach is moderately  distended with what appears to be ingested material. No gastric wall thickening. Mid and distal small bowel are moderately dilated and fluid-filled. No wall thickening is appreciated. Distal small bowel are decompressed with transition zone likely in the mid pelvis. Changes are likely to represent small bowel obstruction. Cause is not determined. Scattered stool throughout the colon. No colonic wall thickening or infiltrative changes. Appendix is surgically absent. Anastomosis at the rectosigmoid junction appears patent. Surgical clips in the pelvis. Vascular/Lymphatic: Aortic atherosclerosis. No enlarged abdominal or pelvic lymph nodes. Reproductive: Status post hysterectomy. No adnexal masses. Other: No abdominal wall hernia or abnormality. No abdominopelvic ascites. Musculoskeletal: Degenerative changes in the spine. Postoperative change with posterior rod and screw fixation from L4 to the sacrum. IMPRESSION: Dilated fluid-filled small bowel with transition zone in the mid pelvis consistent with small bowel obstruction. Cause is not identified. Aortic atherosclerosis. Electronically Signed   By: Lucienne Capers M.D.   On: 04/04/2018 21:33   Dg Abd 1 View  Result Date: 04/05/2018 CLINICAL DATA:  Nasogastric tube placement. EXAM: ABDOMEN - 1 VIEW COMPARISON:  04/04/2018 FINDINGS: An enteric tube terminates in the region of the distal gastric body, more distal than on the prior study. The lower abdomen and pelvis were not imaged. No dilated loops of bowel are seen in the upper abdomen. Pacemaker leads are noted. Evaluation of the lung bases is limited by over penetration. There are surgical clips in the right upper abdomen. IMPRESSION: Enteric tube in the distal stomach. Electronically Signed   By: Logan Bores M.D.   On: 04/05/2018 10:10   Dg Abdomen 1 View  Result Date: 04/04/2018 CLINICAL DATA:  NG tube placement EXAM: ABDOMEN - 1 VIEW COMPARISON:  None. FINDINGS: Enteric tube tip in the left upper  quadrant consistent with location in the body of the stomach. Mildly distended stomach. Mild cardiac enlargement. IMPRESSION: Enteric tube tip in the left upper quadrant consistent with location in the body of the stomach. Electronically Signed   By: Lucienne Capers M.D.   On: 04/04/2018 23:04      Impression / Plan:   Assessment: Active Problems:   Hematemesis  JESIKA MEN is a 77 y.o. y/o female with hematemesis on Eliquis and a drop in her hemoglobin from 13.4- to 9.6.  The patient has maroon-colored aspirate from her NG tube.  The patient has had her Eliquis held.  Plan:  The patient will have her hemoglobin monitored and be transfused as needed.  The patient will undergo an upper endoscopy when stable and the Eliquis has worn off.  The patient likely has a Mallory-Weiss tear from retching due to her small bowel obstruction with blood only coming out after she had multiple episodes of vomiting.  The significant drop in the blood count with a possible Mallory-Weiss tear is due to the Eliquis.  I have discussed this with the hospitalist and will continue monitoring the patient with you.  Thank you for involving me in the care of this patient.      LOS: 0 days   Deborah Lame, MD  04/05/2018, 2:53 PM    Note: This dictation was prepared with Dragon dictation along with smaller phrase technology. Any transcriptional errors that result from this process are unintentional.

## 2018-04-05 NOTE — H&P (Signed)
Deborah Obrien is an 77 y.o. female.   Chief Complaint: Abdominal pain HPI: The patient with past medical history of atrial fibrillation and gastric problems presents to the emergency department complaining of abdominal pain, nausea and vomiting.  The patient admits to blood in her vomit as well as coffee-ground emesis.  Imaging revealed small bowel obstruction but due to the patient's hematemesis the emergency department staff called the hospitalist service for admission for evaluation from gastroenterology.  Past Medical History:  Diagnosis Date  . A-fib ([place]) 2016  . Acid reflux   . Anxiety   . Bowel obstruction ([place])   . CHF (congestive heart failure) ([place]) 2017  . Chronic kidney disease (CKD) stage G3a/A1, moderately decreased glomerular filtration rate (GFR) between 45-59 mL/min/1.73 square meter and albuminuria creatinine ratio less than 30 mg/g (HCC)   . Diabetes mellitus without complication ([place])   . Dyspnea   . Fatty liver   . GI bleed   . Gout   . Hypertension   . Morbid obesity ([place])   . Osteoarthritis   . Osteoporosis   . Paroxysmal atrial fibrillation (HCC)   . Presence of permanent cardiac pacemaker   . Sinus node dysfunction ([place]) 08/24/2017  . TIA (transient ischemic attack)     Past Surgical History:  Procedure Laterality Date  . ABDOMINAL HYSTERECTOMY  1980  . APPENDECTOMY    . BACK SURGERY     2008  . BREAST BIOPSY Right 08/16   stereo fibroadenomatous change, neg for atypia  . BREAST EXCISIONAL BIOPSY Left    neg  . CARDIAC CATHETERIZATION Left 08/25/2016   Procedure: Left Heart Cath and Coronary Angiography;  Surgeon: Corey Skains, MD;  Location: [facility];  Service: Cardiovascular;  Laterality: Left;  . CARDIOVERSION N/A 06/28/2017   Procedure: CARDIOVERSION;  Surgeon: Corey Skains, MD;  Location: [facility];  Service: Cardiovascular;  Laterality: N/A;  . CARDIOVERSION N/A 02/06/2018   Procedure: CARDIOVERSION;  Surgeon: Josue Hector, MD;  Location: [facility];  Service: Cardiovascular;  Laterality: N/A;  . CATARACT EXTRACTION, BILATERAL  2012  . CHOLECYSTECTOMY    . colon blockage  1999  . COLON SURGERY  1999  . COLONOSCOPY  2016   polyps removed 2016  . DORSAL COMPARTMENT RELEASE Left 09/14/2016   Procedure: RELEASE DORSAL COMPARTMENT (DE[place]ERV[place]);  Surgeon: Dereck Leep, MD;  Location: [facility];  Service: Orthopedics;  Laterality: Left;  . ESOPHAGOGASTRODUODENOSCOPY N/A 01/27/2015   Procedure: ESOPHAGOGASTRODUODENOSCOPY (EGD);  Surgeon: Lollie Sails, MD;  Location: [facility];  Service: Endoscopy;  Laterality: N/A;  . ESOPHAGOGASTRODUODENOSCOPY (EGD) WITH PROPOFOL N/A 07/24/2015   Procedure: ESOPHAGOGASTRODUODENOSCOPY (EGD) WITH PROPOFOL;  Surgeon: Lucilla Lame, MD;  Location: [facility];  Service: Endoscopy;  Laterality: N/A;  . JOINT REPLACEMENT  2014   Bilateral Knee replacement  . LEAD REVISION/REPAIR N/A 08/29/2017   Procedure: LEAD REVISION/REPAIR;  Surgeon: Constance Haw, MD;  Location: [facility];  Service: Cardiovascular;  Laterality: N/A;  . PACEMAKER INSERTION Left 07/01/2015   Procedure: INSERTION PACEMAKER;  Surgeon: Isaias Cowman, MD;  Location: [facility];  Service: Cardiovascular;  Laterality: Left;  . PACEMAKER REVISION N/A 08/28/2017   Procedure: PACEMAKER REVISION;  Surgeon: Deboraha Sprang, MD;  Location: [facility];  Service: Cardiovascular;  Laterality: N/A;  . REPLACEMENT TOTAL KNEE BILATERAL    . SHOULDER ARTHROSCOPY WITH SUBACROMIAL DECOMPRESSION Left 2013  . TEE WITHOUT CARDIOVERSION N/A 06/28/2017   Procedure: TRANSESOPHAGEAL ECHOCARDIOGRAM (TEE);  Surgeon: Corey Skains, MD;  Location: [facility];  Service: Cardiovascular;  Laterality: N/A;    Family History  Problem Relation Age of Onset  . Diabetes Mellitus II Mother   . CAD Mother   . Heart attack Mother   . Cancer Father        skin  . Breast cancer Neg Hx    Social History:   reports that she has never smoked. She has never used smokeless tobacco. She reports that she does not drink alcohol or use drugs.  Allergies:  Allergies  Allergen Reactions  . Cephalosporins Other (See Comments)    Reaction:  Unknown   . Macrolides And Ketolides Other (See Comments)    Reaction:  Unknown   . Meperidine Shortness Of Breath, Nausea Only and Other (See Comments)    Reaction:  Stomach pain   . Prednisone Anaphylaxis and Other (See Comments)    Reaction:  Increases pts blood sugar  Pt states that she is allergic to all steroids.    . Shellfish Allergy Anaphylaxis, Shortness Of Breath, Diarrhea, Nausea Only and Rash    Reaction:  Stomach pain   . Sulfa Antibiotics Shortness Of Breath, Diarrhea, Nausea Only and Other (See Comments)    Reaction:  Stomach pain   . Uloric [Febuxostat] Anaphylaxis    Locks pt's body up   . Aspirin Other (See Comments)    Reaction:  Stomach pain   . Celecoxib Other (See Comments)    Reaction:  GI bleed, weakness, and stomach pain.    . Cephalexin Diarrhea, Nausea Only and Other (See Comments)    Reaction:  Stomach pain   . Erythromycin Diarrhea, Nausea Only and Other (See Comments)    Reaction:  Stomach pain   . Hydromorphone Other (See Comments)    Reaction:  Hypotension   . Iodinated Diagnostic Agents Rash and Other (See Comments)    Pt states that she is unable to have because she has chronic kidney disease.    Marland Kitchen Oxycodone Other (See Comments)    Reaction:  Stomach pain   . Atorvastatin Nausea Only and Other (See Comments)    Reaction:  Weakness   . Codeine Diarrhea, Nausea Only and Other (See Comments)    Reaction:  Stomach pain Pt tolerates morphine   . Doxycycline     Stomach pain   . Tape Other (See Comments)    Reaction:  Causes pts skin to tear  Pt states that she is able to use paper tape.    . Valdecoxib Swelling and Other (See Comments)    Pt states that her hands and feet swell.    . Iodine Rash    Medications  Prior to Admission  Medication Sig Dispense Refill  . acetaminophen (TYLENOL) 500 MG tablet Take 1,000 mg by mouth every 6 (six) hours as needed for mild pain or moderate pain.     Marland Kitchen allopurinol (ZYLOPRIM) 100 MG tablet Take 150 mg by mouth daily.     Marland Kitchen amiodarone (PACERONE) 400 MG tablet Take 1/2 tablet (200 mg) by mouth once daily as directed 30 tablet 6  . apixaban (ELIQUIS) 5 MG TABS tablet Take 1 tablet (5 mg total) by mouth 2 (two) times daily. 180 tablet 3  . busPIRone (BUSPAR) 10 MG tablet Take 10 mg by mouth 2 (two) times daily.    . cholecalciferol (VITAMIN D) 1000 units tablet Take 1,000 Units by mouth daily.    . colchicine 0.6 MG tablet Take 0.6  mg by mouth daily as needed (gout).     Marland Kitchen docusate sodium (COLACE) 100 MG capsule Take 100 mg by mouth at bedtime.     . insulin NPH-regular Human (NOVOLIN 70/30) (70-30) 100 UNIT/ML injection Inject 22 Units into the skin 2 (two) times daily before a meal.     . ketoconazole (NIZORAL) 2 % cream Apply 1 application topically every 2 (two) hours as needed (for rash under breasts).     Marland Kitchen oxybutynin (DITROPAN) 5 MG tablet Take 0.5 tablets (2.5 mg total) by mouth at bedtime. (Patient taking differently: Take 10 mg by mouth 2 (two) times daily. ) 30 tablet 0  . pantoprazole (PROTONIX) 40 MG tablet Take 1 tablet (40 mg total) by mouth 2 (two) times daily. 60 tablet 0  . PARoxetine (PAXIL) 20 MG tablet Take 20 mg by mouth daily.    Vladimir Faster Glycol-Propyl Glycol (SYSTANE OP) Apply 1 drop to eye 2 (two) times daily as needed (dry eyes).     . pravastatin (PRAVACHOL) 20 MG tablet Take 20 mg by mouth at bedtime.     . talc (ZEASORB) powder Apply 1 application topically as needed (under the breast irritation).    . traZODone (DESYREL) 50 MG tablet Take 1 tablet (50 mg total) by mouth at bedtime. 30 tablet 0  . furosemide (LASIX) 20 MG tablet Take 1 tablet (20 mg total) by mouth daily. (Patient not taking: Reported on 04/04/2018) 30 tablet 0  .  HYDROcodone-acetaminophen (NORCO/VICODIN) 5-325 MG tablet Take 1 tablet by mouth every 6 (six) hours as needed for moderate pain. (Patient not taking: Reported on 04/04/2018) 8 tablet 0  . hydrocortisone 2.5 % cream Apply 1 application topically daily as needed (rash).      Results for orders placed or performed during the hospital encounter of 04/04/18 (from the past 48 hour(s))  Comprehensive metabolic panel     Status: Abnormal   Collection Time: 04/04/18  8:27 PM  Result Value Ref Range   Sodium 143 135 - 145 mmol/L   Potassium 4.2 3.5 - 5.1 mmol/L   Chloride 109 98 - 111 mmol/L   CO2 25 22 - 32 mmol/L   Glucose, Bld 102 (H) 70 - 99 mg/dL   BUN 22 8 - 23 mg/dL   Creatinine, Ser 1.53 (H) 0.44 - 1.00 mg/dL   Calcium 9.6 8.9 - 10.3 mg/dL   Total Protein 7.4 6.5 - 8.1 g/dL   Albumin 4.0 3.5 - 5.0 g/dL   AST 39 15 - 41 U/L   ALT 19 0 - 44 U/L   Alkaline Phosphatase 92 38 - 126 U/L   Total Bilirubin 1.4 (H) 0.3 - 1.2 mg/dL   GFR calc non Af Amer 32 (L) >60 mL/min   GFR calc Af Amer 37 (L) >60 mL/min    Comment: (NOTE) The eGFR has been calculated using the CKD EPI equation. This calculation has not been validated in all clinical situations. eGFR's persistently <60 mL/min signify possible Chronic Kidney Disease.    Anion gap 9 5 - 15    Comment: Performed at [facility], [address] [place], [place] [address]  CBC     Status: None   Collection Time: 04/04/18  8:27 PM  Result Value Ref Range   WBC 8.2 3.6 - 11.0 K/uL   RBC 4.04 3.80 - 5.20 MIL/uL   Hemoglobin 13.4 12.0 - 16.0 g/dL   HCT 39.4 35.0 - 47.0 %   MCV 97.6 80.0 -  100.0 fL   MCH 33.2 26.0 - 34.0 pg   MCHC 34.0 32.0 - 36.0 g/dL   RDW 13.4 11.5 - 14.5 %   Platelets 218 150 - 440 K/uL    Comment: Performed at [facility], [address] [place], [place] [address]  Protime-INR - (order if Patient is taking Coumadin / Warfarin)     Status: None   Collection Time: 04/04/18  8:27 PM  Result Value  Ref Range   Prothrombin Time 13.8 11.4 - 15.2 seconds   INR 1.07     Comment: Performed at [facility], [address] [place], [place] [address]  Lipase, blood     Status: None   Collection Time: 04/04/18  8:27 PM  Result Value Ref Range   Lipase 25 11 - 51 U/L    Comment: Performed at [facility], [address] [place], [place] [address]  Troponin I     Status: None   Collection Time: 04/04/18  8:27 PM  Result Value Ref Range   Troponin I <0.03 <0.03 ng/mL    Comment: Performed at [facility], [address] [place], [place] [address]  Type and screen Ordered by PROVIDER DEFAULT     Status: None   Collection Time: 04/04/18  8:27 PM  Result Value Ref Range   ABO/RH(D) A POS    Antibody Screen NEG    Sample Expiration      04/07/2018 Performed at [facility], [address] [place], [place] [address]   TSH     Status: Abnormal   Collection Time: 04/04/18  8:27 PM  Result Value Ref Range   TSH 6.195 (H) 0.350 - 4.500 uIU/mL    Comment: Performed by a 3rd Generation assay with a functional sensitivity of <=0.01 uIU/mL. Performed at [facility], [address] [place], [place] [address]   Lactic acid, plasma     Status: None   Collection Time: 04/04/18 11:09 PM  Result Value Ref Range   Lactic Acid, Venous 1.2 0.5 - 1.9 mmol/L    Comment: Performed at [facility], [address] [place], [place] [address]  Glucose, capillary     Status: Abnormal   Collection Time: 04/04/18 11:50 PM  Result Value Ref Range   Glucose-Capillary 124 (H) 70 - 99 mg/dL  Glucose, capillary     Status: Abnormal   Collection Time: 04/05/18  3:32 AM  Result Value Ref Range   Glucose-Capillary 234 (H) 70 - 99 mg/dL   Comment 1 Notify RN    Ct Abdomen Pelvis Wo Contrast  Result Date: 04/04/2018 CLINICAL DATA:  Abdominal pain and vomiting. History of TIA and on Eliquis. History of appendectomy, cholecystectomy, and hysterectomy.  EXAM: CT ABDOMEN AND PELVIS WITHOUT CONTRAST TECHNIQUE: Multidetector CT imaging of the abdomen and pelvis was performed following the standard protocol without IV contrast. COMPARISON:  01/25/2016 FINDINGS: Lower chest: Lung bases are clear. Cardiac enlargement. Coronary artery calcifications. Hepatobiliary: No focal liver abnormality is seen. Status post cholecystectomy. No biliary dilatation. Pancreas: Unremarkable. No pancreatic ductal dilatation or surrounding inflammatory changes. Spleen: Normal in size without focal abnormality. Adrenals/Urinary Tract: Adrenal glands are unremarkable. Kidneys are normal, without renal calculi, focal lesion, or hydronephrosis. Bladder is unremarkable. Stomach/Bowel: Stomach is moderately distended with what appears to be ingested material. No gastric wall thickening. Mid and distal small bowel are moderately dilated and fluid-filled. No wall thickening is appreciated. Distal small bowel are decompressed with transition zone likely  in the mid pelvis. Changes are likely to represent small bowel obstruction. Cause is not determined. Scattered stool throughout the colon. No colonic wall thickening or infiltrative changes. Appendix is surgically absent. Anastomosis at the rectosigmoid junction appears patent. Surgical clips in the pelvis. Vascular/Lymphatic: Aortic atherosclerosis. No enlarged abdominal or pelvic lymph nodes. Reproductive: Status post hysterectomy. No adnexal masses. Other: No abdominal wall hernia or abnormality. No abdominopelvic ascites. Musculoskeletal: Degenerative changes in the spine. Postoperative change with posterior rod and screw fixation from L4 to the sacrum. IMPRESSION: Dilated fluid-filled small bowel with transition zone in the mid pelvis consistent with small bowel obstruction. Cause is not identified. Aortic atherosclerosis. Electronically Signed   By: Lucienne Capers M.D.   On: 04/04/2018 21:33   Dg Abdomen 1 View  Result Date:  04/04/2018 CLINICAL DATA:  NG tube placement EXAM: ABDOMEN - 1 VIEW COMPARISON:  None. FINDINGS: Enteric tube tip in the left upper quadrant consistent with location in the body of the stomach. Mildly distended stomach. Mild cardiac enlargement. IMPRESSION: Enteric tube tip in the left upper quadrant consistent with location in the body of the stomach. Electronically Signed   By: Lucienne Capers M.D.   On: 04/04/2018 23:04    Review of Systems  Constitutional: Negative for chills and fever.  HENT: Negative for sore throat and tinnitus.   Eyes: Negative for blurred vision and redness.  Respiratory: Negative for cough and shortness of breath.   Cardiovascular: Negative for chest pain, palpitations, orthopnea and PND.  Gastrointestinal: Positive for abdominal pain and vomiting. Negative for blood in stool, diarrhea and nausea.  Genitourinary: Negative for dysuria, frequency and urgency.  Musculoskeletal: Negative for joint pain and myalgias.  Skin: Negative for rash.       No lesions  Neurological: Negative for speech change, focal weakness and weakness.  Endo/Heme/Allergies: Does not bruise/bleed easily.       No temperature intolerance  Psychiatric/Behavioral: Negative for depression and suicidal ideas.    Blood pressure (!) 126/37, pulse 60, temperature 98.7 F (37.1 C), temperature source Oral, resp. rate 18, height _0  (1.727 m), weight 110.6 kg (243 lb 13.3 oz), SpO2 100 %. Physical Exam  Vitals reviewed. Constitutional: She is oriented to person, place, and time. She appears well-developed and well-nourished. No distress.  HENT:  Head: Normocephalic and atraumatic.  Mouth/Throat: Oropharynx is clear and moist. No oropharyngeal exudate.  Eyes: Pupils are equal, round, and reactive to light. Conjunctivae and EOM are normal. No scleral icterus.  Neck: Normal range of motion. Neck supple. No JVD present. No tracheal deviation present. No thyromegaly present.  Cardiovascular: Normal  rate, regular rhythm and normal heart sounds. Exam reveals no gallop and no friction rub.  No murmur heard. Respiratory: Effort normal and breath sounds normal.  GI: Soft. Bowel sounds are normal. She exhibits no distension. There is no tenderness.  Genitourinary:  Genitourinary Comments: Deferred  Lymphadenopathy:    She has no cervical adenopathy.  Neurological: She is alert and oriented to person, place, and time. No cranial nerve deficit. She exhibits normal muscle tone.  Skin: Skin is warm and dry. No rash noted. No erythema.  Psychiatric: She has a normal mood and affect. Her behavior is normal. Judgment and thought content normal.     Assessment/Plan This is a 77 year old female admitted for hematemesis. 1.  Hematemesis: Indicating upper GI bleed.  Potential recurrence of gastric polyps.  The patient is n.p.o.  NG tube in place.  IV Protonix (Pepcid if PPI is  still on back order).  Gastroenterology consulted.  Manage blood pressure. 2.  Small bowel obstruction: Surgical service is on board and will intervene when the patient's upper GI bleed has stabilized. 3.  Atrial fibrillation: Rate controlled; hold Eliquis given active GI bleed.  Cardiology consulted for recommendations regarding heart rate and rhythm control as I have currently held the patient's oral amiodarone due to her GI bleed as well as her bradycardia. 4.  CKD: Stage III; hydrate with intravenous fluid.  Avoid nephrotoxic agents. 5.  Diabetes mellitus type 2: Hold mixed insulin.  Continue basal insulin as well as sliding scale, every 4 hours while n.p.o.  Check hemoglobin A1c 6.  Hypertension: Controlled 7.  Gout: Controlled; resume allopurinol and the patient is able to eat 8.  DVT prophylaxis: SCDs 9.  GI prophylaxis: As above The patient is a full code.  Time spent on admission orders and patient care approximately 45 minutes  Harrie Foreman, MD 04/05/2018, 5:47 AM

## 2018-04-05 NOTE — Consult Note (Signed)
Cardiology Consult    Patient ID: [name] MRN: [id number], DOB/AGE: [id number]   Admit date: 04/04/2018 Date of Consult: 04/05/2018  Primary Physician: [name], MD Primary Cardiologist: Dr. Fletcher Anon Requesting Provider: Dr. Cherylann Banas  Patient Profile    Deborah Obrien is a 77 y.o. female with a history of sinus node dysfunction, atrial arrhythmias (Afib, Aflutter typical / atypical) on Eliquis & amiodarone, dual chamber pacemaker ([name]), orthostatic intolerance, CHF, HTN, DM, CKD III, & h/o SBO, gastric polyps, and TIA and who is being seen today for the evaluation of hematemesis at the request of Dr. Cherylann Banas.  Past Medical History   Past Medical History:  Diagnosis Date  . A-fib ([place]) 2016  . Acid reflux   . Anxiety   . Bowel obstruction ([place])   . CHF (congestive heart failure) ([place]) 2017  . Chronic kidney disease (CKD) stage G3a/A1, moderately decreased glomerular filtration rate (GFR) between 45-59 mL/min/1.73 square meter and albuminuria creatinine ratio less than 30 mg/g (HCC)   . Diabetes mellitus without complication ([place])   . Dyspnea   . Fatty liver   . GI bleed   . Gout   . Hypertension   . Morbid obesity ([place])   . Osteoarthritis   . Osteoporosis   . Paroxysmal atrial fibrillation (HCC)   . Presence of permanent cardiac pacemaker   . Sinus node dysfunction ([place]) 08/24/2017  . TIA (transient ischemic attack)     Past Surgical History:  Procedure Laterality Date  . ABDOMINAL HYSTERECTOMY  1980  . APPENDECTOMY    . BACK SURGERY     2008  . BREAST BIOPSY Right 08/16   stereo fibroadenomatous change, neg for atypia  . BREAST EXCISIONAL BIOPSY Left    neg  . CARDIAC CATHETERIZATION Left 08/25/2016   Procedure: Left Heart Cath and Coronary Angiography;  Surgeon: Corey Skains, MD;  Location: [facility];  Service: Cardiovascular;  Laterality: Left;  . CARDIOVERSION N/A 06/28/2017   Procedure: CARDIOVERSION;  Surgeon:  Corey Skains, MD;  Location: [facility];  Service: Cardiovascular;  Laterality: N/A;  . CARDIOVERSION N/A 02/06/2018   Procedure: CARDIOVERSION;  Surgeon: Josue Hector, MD;  Location: [facility];  Service: Cardiovascular;  Laterality: N/A;  . CATARACT EXTRACTION, BILATERAL  2012  . CHOLECYSTECTOMY    . colon blockage  1999  . COLON SURGERY  1999  . COLONOSCOPY  2016   polyps removed 2016  . DORSAL COMPARTMENT RELEASE Left 09/14/2016   Procedure: RELEASE DORSAL COMPARTMENT ([place]);  Surgeon: Dereck Leep, MD;  Location: [facility];  Service: Orthopedics;  Laterality: Left;  . ESOPHAGOGASTRODUODENOSCOPY N/A 01/27/2015   Procedure: ESOPHAGOGASTRODUODENOSCOPY (EGD);  Surgeon: Lollie Sails, MD;  Location[facility];  Service: Endoscopy;  Laterality: N/A;  . ESOPHAGOGASTRODUODENOSCOPY (EGD) WITH PROPOFOL N/A 07/24/2015   Procedure: ESOPHAGOGASTRODUODENOSCOPY (EGD) WITH PROPOFOL;  Surgeon: Lucilla Lame, MD;  Location: [facility] ENDOSCOPY;  Service: Endoscopy;  Laterality: N/A;  . JOINT REPLACEMENT  2014   Bilateral Knee replacement  . LEAD REVISION/REPAIR N/A 08/29/2017   Procedure: LEAD REVISION/REPAIR;  Surgeon: Constance Haw, MD;  Location: [facility];  Service: Cardiovascular;  Laterality: N/A;  . PACEMAKER INSERTION Left 07/01/2015   Procedure: INSERTION PACEMAKER;  Surgeon: Isaias Cowman, MD;  Location: [facility];  Service: Cardiovascular;  Laterality: Left;  . PACEMAKER REVISION N/A 08/28/2017   Procedure: PACEMAKER REVISION;  Surgeon: Deboraha Sprang, MD;  Location: [facility];  Service: Cardiovascular;  Laterality: N/A;  . REPLACEMENT TOTAL KNEE BILATERAL    . SHOULDER ARTHROSCOPY WITH SUBACROMIAL DECOMPRESSION Left 2013  . TEE WITHOUT CARDIOVERSION N/A 06/28/2017   Procedure: TRANSESOPHAGEAL ECHOCARDIOGRAM (TEE);  Surgeon: Corey Skains, MD;  Location: [facility];  Service: Cardiovascular;  Laterality: N/A;     Allergies  Allergies    Allergen Reactions  . Cephalosporins Other (See Comments)    Reaction:  Unknown   . Macrolides And Ketolides Other (See Comments)    Reaction:  Unknown   . Meperidine Shortness Of Breath, Nausea Only and Other (See Comments)    Reaction:  Stomach pain   . Prednisone Anaphylaxis and Other (See Comments)    Reaction:  Increases pts blood sugar  Pt states that she is allergic to all steroids.    . Shellfish Allergy Anaphylaxis, Shortness Of Breath, Diarrhea, Nausea Only and Rash    Reaction:  Stomach pain   . Sulfa Antibiotics Shortness Of Breath, Diarrhea, Nausea Only and Other (See Comments)    Reaction:  Stomach pain   . Uloric [Febuxostat] Anaphylaxis    Locks pt's body up   . Aspirin Other (See Comments)    Reaction:  Stomach pain   . Celecoxib Other (See Comments)    Reaction:  GI bleed, weakness, and stomach pain.    . Cephalexin Diarrhea, Nausea Only and Other (See Comments)    Reaction:  Stomach pain   . Erythromycin Diarrhea, Nausea Only and Other (See Comments)    Reaction:  Stomach pain   . Hydromorphone Other (See Comments)    Reaction:  Hypotension   . Iodinated Diagnostic Agents Rash and Other (See Comments)    Pt states that she is unable to have because she has chronic kidney disease.    Marland Kitchen Oxycodone Other (See Comments)    Reaction:  Stomach pain   . Atorvastatin Nausea Only and Other (See Comments)    Reaction:  Weakness   . Codeine Diarrhea, Nausea Only and Other (See Comments)    Reaction:  Stomach pain Pt tolerates morphine   . Doxycycline     Stomach pain   . Tape Other (See Comments)    Reaction:  Causes pts skin to tear  Pt states that she is able to use paper tape.    . Valdecoxib Swelling and Other (See Comments)    Pt states that her hands and feet swell.    . Iodine Rash    History of Present Illness    Patient is a 77 yo female and current pt of [name] reportedly seen for sinus node dysfunction and exercise intolerance. She was previously  implanted with single chamber pacemaker and underwent a dual chamber upgrade 12/18 by encircling of the chronic RV lead by the atrial lead resulting in tension for which she underwent RA lead revision. She reportedly has a h/o atrial arrhythmias,, with atrial fib and both typical and atypical flutter. She was last admitted 5/19 for recurrent atrial flutter complicated by CHF and restarted on amiodarone with discharge on a diuretic. She also reportedly has a h/o orthostatic intolerance complicated by orthostatic dysnea and long standing DM without complication.   11/16 Echo EF 55-60% 12/17 Echo EF 45% (-) stress 12/17 Cath - Nonobstructive CAD MR 2+ 2/19 Myoview EF 55-65% No ischemia  On 04/04/18, patient reportedly presented to [facility] ED with nausea, hematemesis (coffee ground), and epigastric abdominal pain s/p macaroni and cheese lunch. She reportedly denied CP, SOB, dysuria, change in urinary or  bowel movements, hematochezia, or fever. Patient reports that she is compliant on her medication. Of note, 2016 endoscopy shows polyps.  In the ED 134/63, 77 bpm, RR 28. T 97.7, 100% ORA, wt 244. 6/24 EKG showed HR 69, atrial paced rhythm. CT showed SBO. Surgery consulted.   Husband is currently in room with patient and provided most of history as patient poor historian and still currently with hematemesis and somnolent d/t recent phenergan administration. While performing physical exam, pt BP noted with diastolic in high 80D and 983JA NS bolus ordered as EF normal.    Inpatient Medications    . docusate sodium  100 mg Oral BID  . insulin aspart  0-9 Units Subcutaneous Q4H  . insulin glargine  15 Units Subcutaneous QHS    Family History    Family History  Problem Relation Age of Onset  . Diabetes Mellitus II Mother   . CAD Mother   . Heart attack Mother   . Cancer Father        skin  . Breast cancer Neg Hx    indicated that her mother is deceased. She indicated that her father is deceased.  She indicated that her brother is alive. She indicated that the status of her neg hx is unknown.   Social History    Social History   Socioeconomic History  . Marital status: Married    Spouse name: Not on file  . Number of children: Not on file  . Years of education: Not on file  . Highest education level: Not on file  Occupational History  . Occupation: retired  Scientific laboratory technician  . Financial resource strain: Not on file  . Food insecurity:    Worry: Not on file    Inability: Not on file  . Transportation needs:    Medical: Not on file    Non-medical: Not on file  Tobacco Use  . Smoking status: Never Smoker  . Smokeless tobacco: Never Used  Substance and Sexual Activity  . Alcohol use: No  . Drug use: No  . Sexual activity: Yes  Lifestyle  . Physical activity:    Days per week: Not on file    Minutes per session: Not on file  . Stress: Not on file  Relationships  . Social connections:    Talks on phone: Not on file    Gets together: Not on file    Attends religious service: Not on file    Active member of club or organization: Not on file    Attends meetings of clubs or organizations: Not on file    Relationship status: Not on file  . Intimate partner violence:    Fear of current or ex partner: Not on file    Emotionally abused: Not on file    Physically abused: Not on file    Forced sexual activity: Not on file  Other Topics Concern  . Not on file  Social History Narrative  . Not on file     Review of Systems    Patient poor historian at time of exam and unconscious / unable to respond to questions.   General:  No chills, fever, night sweats. Recent weight changes of 3-4 lbs overnight.  Cardiovascular:  No chest pain, +dyspnea on exertion, +edema, +orthopnea, no palpitations, + paroxysmal nocturnal dyspnea. Dermatological: No rash. Lesions /scabbed cuts noted on arms. Respiratory: No cough, dyspnea.  Urologic: No hematuria, dysuria currently  Abdominal:    Current nausea,+ vomiting, no diarrhea, no bright  red blood per rectum, no melena, current / active hematemesis at time of interview  Neurologic:  Patient unable to respond / interact during interview. All other systems reviewed and are otherwise negative except as noted above.  Physical Exam    Blood pressure (!) 126/37, pulse 60, temperature 98.7 F (37.1 C), temperature source Oral, resp. rate 18, height 5\' 8"  (1.727 m), weight 243 lb 13.3 oz (110.6 kg), SpO2 100 %.  General: Somnolent. Pt in and out of consciousness having reportedly just received phenergan s/p another hematemesis episode /coffee ground emesis.  Psych: Somnolent and not able to respond.  Neuro: Somnolent and not able to assess pt's neuro status. Not able to stay conscious or respond to ROS. HEENT: Normal  Neck: Difficult to assess JVD d/t somnolence and body habitus. Lungs:  Resp regular and unlabored, CTA but difficult to examine d/t somnolence and obesity. Heart: Atrially paced no s3, s4, or murmurs. Abdomen: ++tender, protuberant / disteended, BS absent in all 4 quadrants. Extremities: No clubbing, cyanosis or edema. DP/PT/Radials 2+ and equal bilaterally. Strong radial pulse.  Labs    Troponin (Point of Care Test) No results for input(s): TROPIPOC in the last 72 hours. Recent Labs    04/04/18 2027  TROPONINI <0.03   Lab Results  Component Value Date   WBC 8.2 04/04/2018   HGB 13.4 04/04/2018   HCT 39.4 04/04/2018   MCV 97.6 04/04/2018   PLT 218 04/04/2018    Recent Labs  Lab 04/04/18 2027  NA 143  K 4.2  CL 109  CO2 25  BUN 22  CREATININE 1.53*  CALCIUM 9.6  PROT 7.4  BILITOT 1.4*  ALKPHOS 92  ALT 19  AST 39  GLUCOSE 102*   Lab Results  Component Value Date   CHOL 129 06/12/2017   HDL 46 06/12/2017   LDLCALC 61 06/12/2017   TRIG 110 06/12/2017   No results found for: [facility]   Radiology Studies    Ct Abdomen Pelvis Wo Contrast  Result Date: 04/04/2018 CLINICAL DATA:  Abdominal  pain and vomiting. History of TIA and on Eliquis. History of appendectomy, cholecystectomy, and hysterectomy. EXAM: CT ABDOMEN AND PELVIS WITHOUT CONTRAST TECHNIQUE: Multidetector CT imaging of the abdomen and pelvis was performed following the standard protocol without IV contrast. COMPARISON:  01/25/2016 FINDINGS: Lower chest: Lung bases are clear. Cardiac enlargement. Coronary artery calcifications. Hepatobiliary: No focal liver abnormality is seen. Status post cholecystectomy. No biliary dilatation. Pancreas: Unremarkable. No pancreatic ductal dilatation or surrounding inflammatory changes. Spleen: Normal in size without focal abnormality. Adrenals/Urinary Tract: Adrenal glands are unremarkable. Kidneys are normal, without renal calculi, focal lesion, or hydronephrosis. Bladder is unremarkable. Stomach/Bowel: Stomach is moderately distended with what appears to be ingested material. No gastric wall thickening. Mid and distal small bowel are moderately dilated and fluid-filled. No wall thickening is appreciated. Distal small bowel are decompressed with transition zone likely in the mid pelvis. Changes are likely to represent small bowel obstruction. Cause is not determined. Scattered stool throughout the colon. No colonic wall thickening or infiltrative changes. Appendix is surgically absent. Anastomosis at the rectosigmoid junction appears patent. Surgical clips in the pelvis. Vascular/Lymphatic: Aortic atherosclerosis. No enlarged abdominal or pelvic lymph nodes. Reproductive: Status post hysterectomy. No adnexal masses. Other: No abdominal wall hernia or abnormality. No abdominopelvic ascites. Musculoskeletal: Degenerative changes in the spine. Postoperative change with posterior rod and screw fixation from L4 to the sacrum. IMPRESSION: Dilated fluid-filled small bowel with transition zone in the mid pelvis consistent with  small bowel obstruction. Cause is not identified. Aortic atherosclerosis. Electronically  Signed   By: Lucienne Capers M.D.   On: 04/04/2018 21:33   Dg Abdomen 1 View  Result Date: 04/04/2018 CLINICAL DATA:  NG tube placement EXAM: ABDOMEN - 1 VIEW COMPARISON:  None. FINDINGS: Enteric tube tip in the left upper quadrant consistent with location in the body of the stomach. Mildly distended stomach. Mild cardiac enlargement. IMPRESSION: Enteric tube tip in the left upper quadrant consistent with location in the body of the stomach. Electronically Signed   By: Lucienne Capers M.D.   On: 04/04/2018 23:04    ECG & Cardiac Imaging    07/23/15  TTE EF 55-60% Study Conclusions - Left ventricle: Systolic function was normal. The estimated   ejection fraction was in the range of 55% to 65%. - Aortic valve: Valve area (Vmax): 2.1 cm^2. - Mitral valve: There was moderate regurgitation. - Tricuspid valve: There was moderate regurgitation.  08/17/2016 [facility]  LVEF 45% INTERPRETATION ABNORMAL STRESS ECHOCARDIOGRAM NORMAL RESTING STUDY WITH STRESS-INDUCED WALL MOTION ABNORMALITY INFEROSEPTAL WALL MOTION ABNORMALITY  08/25/16 [facility] EF 55% Mild 2 vessel CAD / Nonobstructive CAD Ost LM to LM lesion 25% stenosed Ost Cx to Prox Cx lesion, 15% stenosed MR 2+ -Assessment: The patient has had progressive canadian class 3 anginal symptoms with a high probability stress test with risk factors including high blood pressure and high cholesterol. normal left ventricular function with ejection fraction of 55% mild 2 vessel coronary artery disease There is not significant stenosis of coronaries -Plan: Continue medical management of CAD risk factors and No further cardiac intervention at this time  06/12/2017  [place] Bl Carotid IMPRESSION: Color duplex indicates minimal heterogeneous and calcified plaque, with no hemodynamically significant stenosis by duplex criteria in the extracranial cerebrovascular circulation. RIGHT CAROTID ARTERY: No significant calcifications of the right common carotid  artery. Intermediate waveform maintained. Heterogeneous and partially calcified plaque at the right carotid bifurcation. No significant lumen shadowing. Low resistance waveform of the right ICA. No significant tortuosity. RIGHT VERTEBRAL ARTERY: Antegrade flow with low resistance waveform. LEFT CAROTID ARTERY: No significant calcifications of the left common carotid artery. Intermediate waveform maintained. Heterogeneous and partially calcified plaque at the left carotid bifurcation without significant lumen shadowing. Low resistance waveform of the left ICA. No significant tortuosity. LEFT VERTEBRAL ARTERY:  Antegrade flow with low resistance waveform.  2/19 Myoview  EF 55-65% No ischemia. No ST segment deviation or T wave inversion. Normal study. Low risk.   Assessment & Plan    1. Atrial arrhythmias with dual pacemaker and h/o sinus node dysfunction - No active CP. Pt not currently a great historian given somnolence at time of exam d/t phenergan administration following active hematemesis.  - H/o orthostatic intolerance & atrial arrhythmias (Afib, Aflutter typical / atypical) on Eliquis & amiodarone. Dual pacemake. Azure XT - [name] pt. Device interrogation report 7/9 -AT/AF 6h1d and avg ventricular rate 100+ bpm  - 2/19 Myoview EF 55-65 % as above. No ischemia. - Home 200mg  amiodarone prescription held with admission and pt currently NPO.  - Given pt currently in SR, continue to hold amiodarone. Plan to restart amiodarone at time of discharge with rest of home medications. Closely monitor telemetry. If during current admission pt has Afib/ Aflutter, plan to administer IV amiodarone. Otherwise, can continue to hold amiodarone in setting of SBO.  2. Hypotension  - H/o orthostatic intolerance - 500cc NS bolus administered for diastolic BP in range 67T-24P. Last EF nml.  -  Repeat CBC ordered. - Continue to monitor BP  3. CHF - Recent reported weight changes 3-4 lbs overnight and  LEE  - Continue to monitor fluid status with daily weights.  4. DM - Continue insulin and monitor glucose. - Pt NPO. - Per IM  5. CKD III - Recent hypotension. - Home medications held as pt NPO - Continue to monitor renal function. - Per IM  6. SBO with h/o Gastric polyps - Per IM and Surgery. - Patient remains NPO -Continue to monitor via telemetry in setting of current SBO and plan to restart amiodarone at discharge. If Afib / Aflutter prior to discharge, administer IV amiodarone.  For questions or updates, please contact [place] Please consult [email or web address] for contact info under Cardiology/STEMI.      Dorthula Nettles, PA-C  Pager [phone] 04/05/2018, 7:43 AM

## 2018-04-05 NOTE — Telephone Encounter (Signed)
I called to follow up with the patient regarding Dr. Olin Pia recommendations.  I spoke with her husband and he advised me that she was seen in the ER last night and admitted today for a bowel obstruction.  He states she was seen by the cardiology PA since being there.  I advised him I was sorry for the late call back as I was out of the office yesterday.

## 2018-04-05 NOTE — Progress Notes (Signed)
Patient's husband reported that patient had not urinated since being in the hospital, patient was bladder scanned noted to have 309 ml urine in bladder, foley cath inserted per Dr. Jerelyn Charles verbal order. Patient began complaining of right flank pain after foley insertion, nurse assessed area and noted no physical change to area, nurse asked patient " have you had this pain before" patient stated " yes". Nurse gave tylenol suppository. Shortly after suppository patient called front desk asking for more pain meds. Nurse returned to patient's room and then asked patient's husband " has she had pain in this area before"? Patient's husband stated " no" patient at this time was restless and guarding right side. Nurse reported pain to Dr. Jerelyn Charles, per Dr. Jerelyn Charles "pain is related to obstruction". Dr. Jerelyn Charles gave verbal for morphine PRN. Morphine given and pain was relieved briefly for patient.

## 2018-04-06 ENCOUNTER — Inpatient Hospital Stay: Payer: Medicare Other

## 2018-04-06 ENCOUNTER — Inpatient Hospital Stay: Payer: Self-pay

## 2018-04-06 DIAGNOSIS — I481 Persistent atrial fibrillation: Secondary | ICD-10-CM

## 2018-04-06 DIAGNOSIS — J9602 Acute respiratory failure with hypercapnia: Secondary | ICD-10-CM

## 2018-04-06 LAB — BLOOD GAS, ARTERIAL
ACID-BASE DEFICIT: 8.7 mmol/L — AB (ref 0.0–2.0)
BICARBONATE: 20.9 mmol/L (ref 20.0–28.0)
FIO2: 0.28
O2 Saturation: 45.4 %
PATIENT TEMPERATURE: 37
PO2 ART: 36 mmHg — AB (ref 83.0–108.0)
pCO2 arterial: 69 mmHg (ref 32.0–48.0)
pH, Arterial: 7.09 — CL (ref 7.350–7.450)

## 2018-04-06 LAB — BASIC METABOLIC PANEL
Anion gap: 5 (ref 5–15)
Anion gap: 7 (ref 5–15)
Anion gap: 7 (ref 5–15)
Anion gap: 4 — ABNORMAL LOW (ref 5–15)
BUN: 73 mg/dL — AB (ref 8–23)
BUN: 76 mg/dL — ABNORMAL HIGH (ref 8–23)
BUN: 76 mg/dL — AB (ref 8–23)
BUN: 72 mg/dL — ABNORMAL HIGH (ref 8–23)
CALCIUM: 7.9 mg/dL — AB (ref 8.9–10.3)
CHLORIDE: 114 mmol/L — AB (ref 98–111)
CO2: 23 mmol/L (ref 22–32)
CO2: 21 mmol/L — ABNORMAL LOW (ref 22–32)
CO2: 24 mmol/L (ref 22–32)
CO2: 23 mmol/L (ref 22–32)
CREATININE: 2.82 mg/dL — AB (ref 0.44–1.00)
Calcium: 7.8 mg/dL — ABNORMAL LOW (ref 8.9–10.3)
Calcium: 7.9 mg/dL — ABNORMAL LOW (ref 8.9–10.3)
Calcium: 7.2 mg/dL — ABNORMAL LOW (ref 8.9–10.3)
Chloride: 114 mmol/L — ABNORMAL HIGH (ref 98–111)
Chloride: 117 mmol/L — ABNORMAL HIGH (ref 98–111)
Chloride: 119 mmol/L — ABNORMAL HIGH (ref 98–111)
Creatinine, Ser: 2.71 mg/dL — ABNORMAL HIGH (ref 0.44–1.00)
Creatinine, Ser: 2.62 mg/dL — ABNORMAL HIGH (ref 0.44–1.00)
Creatinine, Ser: 2.2 mg/dL — ABNORMAL HIGH (ref 0.44–1.00)
GFR calc Af Amer: 18 mL/min — ABNORMAL LOW (ref >60)
GFR calc Af Amer: 19 mL/min — ABNORMAL LOW (ref >60)
GFR calc non Af Amer: 15 mL/min — ABNORMAL LOW (ref >60)
GFR calc non Af Amer: 16 mL/min — ABNORMAL LOW (ref >60)
GFR calc non Af Amer: 17 mL/min — ABNORMAL LOW (ref >60)
GFR calc non Af Amer: 20 mL/min — ABNORMAL LOW (ref >60)
GFR, EST AFRICAN AMERICAN: 18 mL/min — AB (ref >60)
GFR, EST AFRICAN AMERICAN: 24 mL/min — AB (ref >60)
GLUCOSE: 257 mg/dL — AB (ref 70–99)
GLUCOSE: 201 mg/dL — AB (ref 70–99)
Glucose, Bld: 277 mg/dL — ABNORMAL HIGH (ref 70–99)
Glucose, Bld: 256 mg/dL — ABNORMAL HIGH (ref 70–99)
Potassium: 6.8 mmol/L (ref 3.5–5.1)
Potassium: 7.4 mmol/L (ref 3.5–5.1)
Potassium: 5.7 mmol/L — ABNORMAL HIGH (ref 3.5–5.1)
Potassium: 5.2 mmol/L — ABNORMAL HIGH (ref 3.5–5.1)
SODIUM: 142 mmol/L (ref 135–145)
Sodium: 145 mmol/L (ref 135–145)
Sodium: 145 mmol/L (ref 135–145)
Sodium: 146 mmol/L — ABNORMAL HIGH (ref 135–145)

## 2018-04-06 LAB — CBC
HEMATOCRIT: 25 % — AB (ref 35.0–47.0)
Hemoglobin: 8.1 g/dL — ABNORMAL LOW (ref 12.0–16.0)
MCH: 32.9 pg (ref 26.0–34.0)
MCHC: 32.4 g/dL (ref 32.0–36.0)
MCV: 101.5 fL — AB (ref 80.0–100.0)
Platelets: 179 10*3/uL (ref 150–440)
RBC: 2.46 MIL/uL — ABNORMAL LOW (ref 3.80–5.20)
RDW: 14.3 % (ref 11.5–14.5)
WBC: 8.8 10*3/uL (ref 3.6–11.0)

## 2018-04-06 LAB — COMPREHENSIVE METABOLIC PANEL
ALT: 19 U/L (ref 0–44)
AST: 27 U/L (ref 15–41)
Albumin: 2.9 g/dL — ABNORMAL LOW (ref 3.5–5.0)
Alkaline Phosphatase: 70 U/L (ref 38–126)
Anion gap: 7 (ref 5–15)
BUN: 73 mg/dL — AB (ref 8–23)
CO2: 22 mmol/L (ref 22–32)
Calcium: 7.6 mg/dL — ABNORMAL LOW (ref 8.9–10.3)
Chloride: 114 mmol/L — ABNORMAL HIGH (ref 98–111)
Creatinine, Ser: 2.9 mg/dL — ABNORMAL HIGH (ref 0.44–1.00)
GFR, EST AFRICAN AMERICAN: 17 mL/min — AB (ref >60)
GFR, EST NON AFRICAN AMERICAN: 15 mL/min — AB (ref >60)
Glucose, Bld: 244 mg/dL — ABNORMAL HIGH (ref 70–99)
POTASSIUM: 6.7 mmol/L — AB (ref 3.5–5.1)
Sodium: 143 mmol/L (ref 135–145)
TOTAL PROTEIN: 5.3 g/dL — AB (ref 6.5–8.1)
Total Bilirubin: 0.7 mg/dL (ref 0.3–1.2)

## 2018-04-06 LAB — MRSA PCR SCREENING: MRSA by PCR: NEGATIVE

## 2018-04-06 LAB — GLUCOSE, CAPILLARY
Glucose-Capillary: 194 mg/dL — ABNORMAL HIGH (ref 70–99)
Glucose-Capillary: 214 mg/dL — ABNORMAL HIGH (ref 70–99)
Glucose-Capillary: 235 mg/dL — ABNORMAL HIGH (ref 70–99)
Glucose-Capillary: 261 mg/dL — ABNORMAL HIGH (ref 70–99)
Glucose-Capillary: 261 mg/dL — ABNORMAL HIGH (ref 70–99)
Glucose-Capillary: 263 mg/dL — ABNORMAL HIGH (ref 70–99)

## 2018-04-06 LAB — HEMOGLOBIN AND HEMATOCRIT, BLOOD
HCT: 24.8 % — ABNORMAL LOW (ref 35.0–47.0)
HCT: 25.6 % — ABNORMAL LOW (ref 35.0–47.0)
HEMOGLOBIN: 8.5 g/dL — AB (ref 12.0–16.0)
Hemoglobin: 8.1 g/dL — ABNORMAL LOW (ref 12.0–16.0)

## 2018-04-06 LAB — NA AND K (SODIUM & POTASSIUM), RAND UR
Potassium Urine: 90 mmol/L
Sodium, Ur: 32 mmol/L

## 2018-04-06 MED ORDER — SODIUM CHLORIDE 0.9% IV SOLUTION
Freq: Once | INTRAVENOUS | Status: AC
  Administered 2018-04-06: 11:00:00 via INTRAVENOUS

## 2018-04-06 MED ORDER — SODIUM BICARBONATE 8.4 % IV SOLN
100.0000 meq | Freq: Once | INTRAVENOUS | Status: AC
  Administered 2018-04-06: 100 meq via INTRAVENOUS
  Filled 2018-04-06: qty 50

## 2018-04-06 MED ORDER — FENTANYL CITRATE (PF) 100 MCG/2ML IJ SOLN: 12.5000 ug | INTRAMUSCULAR | Status: DC | PRN

## 2018-04-06 MED ORDER — NALOXONE HCL 0.4 MG/ML IJ SOLN
INTRAMUSCULAR | Status: AC
  Filled 2018-04-06: qty 1

## 2018-04-06 MED ORDER — SODIUM CHLORIDE 0.9% FLUSH
10.0000 mL | INTRAVENOUS | Status: DC | PRN
  Administered 2018-04-16: 10 mL
  Filled 2018-04-06: qty 40

## 2018-04-06 MED ORDER — CALCIUM GLUCONATE 10 % IV SOLN
1.0000 g | Freq: Once | INTRAVENOUS | Status: AC
  Administered 2018-04-06: 1 g via INTRAVENOUS
  Filled 2018-04-06: qty 10

## 2018-04-06 MED ORDER — DEXTROSE 50 % IV SOLN
1.0000 | Freq: Once | INTRAVENOUS | Status: AC
  Administered 2018-04-06: 50 mL via INTRAVENOUS
  Filled 2018-04-06: qty 50

## 2018-04-06 MED ORDER — INSULIN ASPART 100 UNIT/ML IV SOLN
10.0000 [IU] | Freq: Once | INTRAVENOUS | Status: AC
  Administered 2018-04-06: 10 [IU] via INTRAVENOUS
  Filled 2018-04-06 (×2): qty 0.1

## 2018-04-06 MED ORDER — SODIUM CHLORIDE 0.9% FLUSH
10.0000 mL | Freq: Two times a day (BID) | INTRAVENOUS | Status: DC
  Administered 2018-04-07: 10 mL
  Administered 2018-04-07: 30 mL
  Administered 2018-04-08: 20 mL
  Administered 2018-04-08 – 2018-04-15 (×10): 10 mL
  Administered 2018-04-15: 20 mL
  Administered 2018-04-16 (×2): 10 mL
  Administered 2018-04-17: 20 mL
  Administered 2018-04-17: 10 mL
  Administered 2018-04-18 (×2): 20 mL
  Administered 2018-04-19 (×2): 10 mL
  Administered 2018-04-20 (×2): 30 mL
  Administered 2018-04-21 – 2018-04-22 (×3): 10 mL
  Administered 2018-04-22: 30 mL

## 2018-04-06 NOTE — Progress Notes (Signed)
   04/06/18 1430  Clinical Encounter Type  Visited With Family  Visit Type Initial  Referral From Family  Consult/Referral To Chaplain  Spiritual Encounters  Spiritual Needs Prayer   Patient spouse stopped chaplain in the hallway and requested prayer for his wife.  Chaplain prayed with spouse and daughter for patient's health and family support.  Chaplain engaged in active listening as to patient's condition and family hopes.  Chaplain encouraged family to have chaplain paged as needed.

## 2018-04-06 NOTE — Progress Notes (Signed)
CRITICAL VALUE STICKER  CRITICAL VALUE: Potassium 7.4  RECEIVER Deborah Obrien  DATE & TIME NOTIFIED: 1400  MESSENGER (representative from lab):  MD NOTIFIED: [place]  TIME OF NOTIFICATION: 8841  RESPONSE: Redraw sample after picc line insertion.

## 2018-04-06 NOTE — Progress Notes (Signed)
notified MD pt has po=tassium of 6.7 and BP 98/58. Pt is very drowsy. Rapid response was call this morning and narcan was given . Per MD pt to be transfer to ICU.

## 2018-04-06 NOTE — Progress Notes (Signed)
Peripherally Inserted Central Catheter/Midline Placement  The IV Nurse has discussed with the patient and/or persons authorized to consent for the patient, the purpose of this procedure and the potential benefits and risks involved with this procedure.  The benefits include less needle sticks, lab draws from the catheter, and the patient may be discharged home with the catheter. Risks include, but not limited to, infection, bleeding, blood clot (thrombus formation), and puncture of an artery; nerve damage and irregular heartbeat and possibility to perform a PICC exchange if needed/ordered by physician.  Alternatives to this procedure were also discussed.  Bard Power PICC patient education guide, fact sheet on infection prevention and patient information card has been provided to patient /or left at bedside.  Husband signed consent  PICC/Midline Placement Documentation  PICC Triple Lumen 04/06/18 PICC Right Brachial 37 cm 0 cm (Active)  Indication for Insertion or Continuance of Line Poor Vasculature-patient has had multiple peripheral attempts or PIVs lasting less than 24 hours 04/06/2018  2:55 PM  Exposed Catheter (cm) 0 cm 04/06/2018  2:55 PM  Site Assessment Clean;Dry;Intact 04/06/2018  2:55 PM  Lumen #1 Status Flushed;Saline locked;Blood return noted 04/06/2018  2:55 PM  Lumen #2 Status Flushed;Saline locked;Blood return noted 04/06/2018  2:55 PM  Lumen #3 Status Flushed;Saline locked;Blood return noted 04/06/2018  2:55 PM  Dressing Type Transparent 04/06/2018  2:55 PM  Dressing Status Clean;Dry;Intact;Antimicrobial disc in place 04/06/2018  2:55 PM  Dressing Change Due 04/13/18 04/06/2018  2:55 PM       Gordan Payment 04/06/2018, 2:56 PM

## 2018-04-06 NOTE — Progress Notes (Signed)
An episode of acute respiratory failure yesterday.  The patient hemoglobin has been stable.  The patient is in no condition for any further GI intervention at this time.  Please contact us when the patient is more stable and able to undergo a GI work-up for her upper GI bleed.  Until then we will hold off on following this patient for now.  Please reconsult [place] when the patient is stable.  I will sign off.  Please call if any further GI concerns or questions.  We would like to thank you for the opportunity to participate in the care of Deborah Obrien.

## 2018-04-06 NOTE — Consult Note (Addendum)
[place] [facility] Consultation      Name: Deborah Obrien MRN: [id number] DOB: [birth year]    ADMISSION DATE:  04/04/2018 CONSULTATION DATE:  04/06/2018  REFERRING MD :  DIAMOND   CHIEF COMPLAINT:   Abdominal pain  HISTORY OF PRESENT ILLNESS   77 year old female with atrial fibrillation, hypertension, DM, CKD stage III, gastric polyps, upper GI bleeding, and CHF who presented to the emergency department on 7/24 c/o abdominal pain, nausea and coffee ground emesis that had started earlier in the day. She endorses taking eliquis for her atrial fibrillation with last dose being 7/24 in the morning. In the ED patient underwent CT scan which showed SBO likely secondary to surgical adhesions. An NG tube was placed and surgery consulted for further management. Patient was admitted to the hospital for upper GI bleed and SBO.   Patient is unable to provide history due to altered mental status and respiratory failure. History is provided by nursing report and chart review.  SIGNIFICANT EVENTS   This morning patient was found to be unresponsive with hypotension and a rapid response was called. Normal saline bolus was given along with narcan without relief of symptoms. Potassium was noted to be 6.7. ABG showed pH of 7.09 and CO2 of 69. Patient placed on BiPAP and transported to ICU for further management.  Hemoglobin noted to have dropped from 13.4 on admission, to 8.1 this morning. 2 units of PRBC transfused.     PAST MEDICAL HISTORY    :  Past Medical History:  Diagnosis Date  . A-fib ([place]) 2016  . Acid reflux   . Anxiety   . Bowel obstruction ([place])   . CHF (congestive heart failure) ([place]) 2017  . Chronic kidney disease (CKD) stage G3a/A1, moderately decreased glomerular filtration rate (GFR) between 45-59 mL/min/1.73 square meter and albuminuria creatinine ratio less than 30 mg/g (H[facility])   . Diabetes mellitus without complication ([place])   . Dyspnea   . Fatty liver   .  GI bleed   . Gout   . Hypertension   . Morbid obesity ([place])   . Osteoarthritis   . Osteoporosis   . Paroxysmal atrial fibrillation (H[facility])   . Presence of permanent cardiac pacemaker   . Sinus node dysfunction ([place]) 08/24/2017  . TIA (transient ischemic attack)    Past Surgical History:  Procedure Laterality Date  . ABDOMINAL HYSTERECTOMY  1980  . APPENDECTOMY    . BACK SURGERY     2008  . BREAST BIOPSY Right 08/16   stereo fibroadenomatous change, neg for atypia  . BREAST EXCISIONAL BIOPSY Left    neg  . CARDIAC CATHETERIZATION Left 08/25/2016   Procedure: Left Heart Cath and Coronary Angiography;  Surgeon: Corey Skains, MD;  Location: [facility];  Service: Cardiovascular;  Laterality: Left;  . CARDIOVERSION N/A 06/28/2017   Procedure: CARDIOVERSION;  Surgeon: Corey Skains, MD;  Location: [facility];  Service: Cardiovascular;  Laterality: N/A;  . CARDIOVERSION N/A 02/06/2018   Procedure: CARDIOVERSION;  Surgeon: Josue Hector, MD;  Location: [facility];  Service: Cardiovascular;  Laterality: N/A;  . CATARACT EXTRACTION, BILATERAL  2012  . CHOLECYSTECTOMY    . colon blockage  1999  . COLON SURGERY  1999  . COLONOSCOPY  2016   polyps removed 2016  . DORSAL COMPARTMENT RELEASE Left 09/14/2016   Procedure: RELEASE DORSAL COMPARTMENT ([place]);  Surgeon: Dereck Leep, MD;  Location: [facility];  Service: Orthopedics;  Laterality: Left;  .  ESOPHAGOGASTRODUODENOSCOPY N/A 01/27/2015   Procedure: ESOPHAGOGASTRODUODENOSCOPY (EGD);  Surgeon: Lollie Sails, MD;  Location[facility];  Service: Endoscopy;  Laterality: N/A;  . ESOPHAGOGASTRODUODENOSCOPY (EGD) WITH PROPOFOL N/A 07/24/2015   Procedure: ESOPHAGOGASTRODUODENOSCOPY (EGD) WITH PROPOFOL;  Surgeon: Lucilla Lame, MD;  Location: [facility];  Service: Endoscopy;  Laterality: N/A;  . JOINT REPLACEMENT  2014   Bilateral Knee replacement  . LEAD REVISION/REPAIR N/A 08/29/2017   Procedure: LEAD  REVISION/REPAIR;  Surgeon: Constance Haw, MD;  Location: [facility];  Service: Cardiovascular;  Laterality: N/A;  . PACEMAKER INSERTION Left 07/01/2015   Procedure: INSERTION PACEMAKER;  Surgeon: Isaias Cowman, MD;  Location: [facility];  Service: Cardiovascular;  Laterality: Left;  . PACEMAKER REVISION N/A 08/28/2017   Procedure: PACEMAKER REVISION;  Surgeon: Deboraha Sprang, MD;  Location: [facility];  Service: Cardiovascular;  Laterality: N/A;  . REPLACEMENT TOTAL KNEE BILATERAL    . SHOULDER ARTHROSCOPY WITH SUBACROMIAL DECOMPRESSION Left 2013  . TEE WITHOUT CARDIOVERSION N/A 06/28/2017   Procedure: TRANSESOPHAGEAL ECHOCARDIOGRAM (TEE);  Surgeon: Corey Skains, MD;  Location: [facility];  Service: Cardiovascular;  Laterality: N/A;   Prior to Admission medications   Medication Sig Start Date End Date Taking? Authorizing Provider  acetaminophen (TYLENOL) 500 MG tablet Take 1,000 mg by mouth every 6 (six) hours as needed for mild pain or moderate pain.    Yes [provider]  allopurinol (ZYLOPRIM) 100 MG tablet Take 150 mg by mouth daily.    Yes [provider]  amiodarone (PACERONE) 400 MG tablet Take 1/2 tablet (200 mg) by mouth once daily as directed 03/20/18  Yes Deboraha Sprang, MD  apixaban (ELIQUIS) 5 MG TABS tablet Take 1 tablet (5 mg total) by mouth 2 (two) times daily. 03/05/18  Yes Deboraha Sprang, MD  busPIRone (BUSPAR) 10 MG tablet Take 10 mg by mouth 2 (two) times daily.   Yes [provider]  cholecalciferol (VITAMIN D) 1000 units tablet Take 1,000 Units by mouth daily.   Yes [provider]  colchicine 0.6 MG tablet Take 0.6 mg by mouth daily as needed (gout).    Yes [provider]  docusate sodium (COLACE) 100 MG capsule Take 100 mg by mouth at bedtime.    Yes [provider]  insulin NPH-regular Human (NOVOLIN 70/30) (70-30) 100 UNIT/ML injection Inject 22 Units into the skin 2 (two) times daily  before a meal.    Yes [provider]  ketoconazole (NIZORAL) 2 % cream Apply 1 application topically every 2 (two) hours as needed (for rash under breasts).    Yes [provider]  oxybutynin (DITROPAN) 5 MG tablet Take 0.5 tablets (2.5 mg total) by mouth at bedtime. Patient taking differently: Take 10 mg by mouth 2 (two) times daily.  06/13/17  Yes Epifanio Lesches, MD  pantoprazole (PROTONIX) 40 MG tablet Take 1 tablet (40 mg total) by mouth 2 (two) times daily. 12/29/17  Yes Darel Hong, MD  PARoxetine (PAXIL) 20 MG tablet Take 20 mg by mouth daily.   Yes [provider]  Polyethyl Glycol-Propyl Glycol (SYSTANE OP) Apply 1 drop to eye 2 (two) times daily as needed (dry eyes).    Yes [provider]  pravastatin (PRAVACHOL) 20 MG tablet Take 20 mg by mouth at bedtime.    Yes [provider]  talc (ZEASORB) powder Apply 1 application topically as needed (under the breast irritation).   Yes [provider]  traZODone (DESYREL) 50 MG  tablet Take 1 tablet (50 mg total) by mouth at bedtime. 06/13/17  Yes Epifanio Lesches, MD  furosemide (LASIX) 20 MG tablet Take 1 tablet (20 mg total) by mouth daily. Patient not taking: Reported on 04/04/2018 02/07/18   Hosie Poisson, MD  HYDROcodone-acetaminophen (NORCO/VICODIN) 5-325 MG tablet Take 1 tablet by mouth every 6 (six) hours as needed for moderate pain. Patient not taking: Reported on 04/04/2018 08/30/17 08/30/18  Baldwin Jamaica, PA-C  hydrocortisone 2.5 % cream Apply 1 application topically daily as needed (rash).    [provider]   Allergies  Allergen Reactions  . Cephalosporins Other (See Comments)    Reaction:  Unknown   . Macrolides And Ketolides Other (See Comments)    Reaction:  Unknown   . Meperidine Shortness Of Breath, Nausea Only and Other (See Comments)    Reaction:  Stomach pain   . Prednisone Anaphylaxis and Other (See Comments)    Reaction:  Increases pts  blood sugar  Pt states that she is allergic to all steroids.    . Shellfish Allergy Anaphylaxis, Shortness Of Breath, Diarrhea, Nausea Only and Rash    Reaction:  Stomach pain   . Sulfa Antibiotics Shortness Of Breath, Diarrhea, Nausea Only and Other (See Comments)    Reaction:  Stomach pain   . Uloric [Febuxostat] Anaphylaxis    Locks pt's body up   . Aspirin Other (See Comments)    Reaction:  Stomach pain   . Celecoxib Other (See Comments)    Reaction:  GI bleed, weakness, and stomach pain.    . Cephalexin Diarrhea, Nausea Only and Other (See Comments)    Reaction:  Stomach pain   . Erythromycin Diarrhea, Nausea Only and Other (See Comments)    Reaction:  Stomach pain   . Hydromorphone Other (See Comments)    Reaction:  Hypotension   . Iodinated Diagnostic Agents Rash and Other (See Comments)    Pt states that she is unable to have because she has chronic kidney disease.    Marland Kitchen Oxycodone Other (See Comments)    Reaction:  Stomach pain   . Atorvastatin Nausea Only and Other (See Comments)    Reaction:  Weakness   . Codeine Diarrhea, Nausea Only and Other (See Comments)    Reaction:  Stomach pain Pt tolerates morphine   . Doxycycline     Stomach pain   . Tape Other (See Comments)    Reaction:  Causes pts skin to tear  Pt states that she is able to use paper tape.    . Valdecoxib Swelling and Other (See Comments)    Pt states that her hands and feet swell.    . Iodine Rash     FAMILY HISTORY   Family History  Problem Relation Age of Onset  . Diabetes Mellitus II Mother   . CAD Mother   . Heart attack Mother   . Cancer Father        skin  . Breast cancer Neg Hx       SOCIAL HISTORY    reports that she has never smoked. She has never used smokeless tobacco. She reports that she does not drink alcohol or use drugs.  Review of Systems  Unable to perform ROS: Acuity of condition      VITAL SIGNS    Temp:  [97.6 F (36.4 C)-99.3 F (37.4 C)] 98.9 F (37.2  C) (07/26 1015) Pulse Rate:  [59-81] 60 (07/26 1100) Resp:  [15-24] 17 (07/26 1100) BP: (  80-128)/(28-78) 114/37 (07/26 1100) SpO2:  [97 %-100 %] 100 % (07/26 1100) Weight:  [114.8 kg (253 lb 1.4 oz)-115.6 kg (254 lb 13.6 oz)] 114.8 kg (253 lb 1.4 oz) (07/26 0958)   INTAKE / OUTPUT:  Intake/Output Summary (Last 24 hours) at 04/06/2018 1118 Last data filed at 04/06/2018 1100 Gross per 24 hour  Intake 4153.44 ml  Output 575 ml  Net 3578.44 ml       PHYSICAL EXAM   Physical Exam  Constitutional:  Elderly, obese female. Appears ill and in distress.  HENT:  Head: Normocephalic and atraumatic.  On BiPAP with NG tube  Eyes: Pupils are equal, round, and reactive to light.  Cardiovascular:  S1 and S2. Regular rate and rhythm. No murmurs, rubs, gallops. 2+ radial pulses bilaterally, 1+ dorsalis pedis pulses bilaterally  Pulmonary/Chest: She is in respiratory distress. She has no wheezes. She has no rhonchi. She has no rales.  Abdominal:  Round, obese abdomen. Moderate epigastric tenderness to palpation, diminished bowel sounds. No rebound tenderness.    Musculoskeletal: Normal range of motion. She exhibits no edema.  Lymphadenopathy:    She has no cervical adenopathy.  Neurological:  Follows verbal commands.  Skin: Skin is warm and dry. Capillary refill takes 2 to 3 seconds.       LABS   LABS:  CBC Recent Labs  Lab 04/04/18 2027 04/05/18 0924 04/05/18 1645 04/06/18 0029 04/06/18 0509  WBC 8.2 8.3  --   --  8.8  HGB 13.4 9.6* 9.2* 8.1* 8.1*  HCT 39.4 29.1* 27.9* 24.8* 25.0*  PLT 218 176  --   --  179   Coag's Recent Labs  Lab 04/04/18 2027  INR 1.07   BMET Recent Labs  Lab 04/04/18 2027 04/06/18 0509 04/06/18 0759  NA 143 143 142  K 4.2 6.7* 6.8*  CL 109 114* 114*  CO2 25 22 23   BUN 22 73* 73*  CREATININE 1.53* 2.90* 2.82*  GLUCOSE 102* 244* 277*   Electrolytes Recent Labs  Lab 04/04/18 2027 04/06/18 0509 04/06/18 0759  CALCIUM 9.6 7.6* 7.8*     Sepsis Markers Recent Labs  Lab 04/04/18 2309  LATICACIDVEN 1.2   ABG Recent Labs  Lab 04/06/18 0754  PHART 7.09*  PCO2ART 69*  PO2ART 36*   Liver Enzymes Recent Labs  Lab 04/04/18 2027 04/06/18 0509  AST 39 27  ALT 19 19  ALKPHOS 92 70  BILITOT 1.4* 0.7  ALBUMIN 4.0 2.9*   Cardiac Enzymes Recent Labs  Lab 04/04/18 2027  TROPONINI <0.03   Glucose Recent Labs  Lab 04/05/18 1825 04/05/18 2031 04/06/18 0007 04/06/18 0421 04/06/18 0732 04/06/18 0833  GLUCAP 169* 155* 194* 214* 263* 235*     Recent Results (from the past 240 hour(s))  MRSA PCR Screening     Status: None   Collection Time: 04/06/18  9:05 AM  Result Value Ref Range Status   MRSA by PCR NEGATIVE NEGATIVE Final    Comment:        The GeneXpert MRSA Assay (FDA approved for NASAL specimens only), is one component of a comprehensive MRSA colonization surveillance program. It is not intended to diagnose MRSA infection nor to guide or monitor treatment for MRSA infections. Performed at [facility], [address], [place], [place] [address]      Current Facility-Administered Medications:  .  0.9 %  sodium chloride infusion (Manually program via Guardrails IV Fluids), , Intravenous, Once, Salary, Montell D, MD .  0.9 %  sodium chloride infusion (Manually program via Guardrails IV Fluids), , Intravenous, Once, Salary, Montell D, MD .  0.9 %  sodium chloride infusion, , Intravenous, Continuous, Harrie Foreman, MD, Last Rate: 125 mL/hr at 04/06/18 1100 .  acetaminophen (TYLENOL) tablet 650 mg, 650 mg, Oral, Q6H PRN **OR** acetaminophen (TYLENOL) suppository 650 mg, 650 mg, Rectal, Q6H PRN, Harrie Foreman, MD, 650 mg at 04/05/18 1512 .  docusate sodium (COLACE) capsule 100 mg, 100 mg, Oral, BID, Harrie Foreman, MD, Stopped at 04/06/18 [phone] .  insulin aspart (novoLOG) injection 0-9 Units, 0-9 Units, Subcutaneous, Q4H, Harrie Foreman, MD, 5 Units at 04/06/18 0803 .   naloxone [facility]) 0.4 MG/ML injection, , , ,  .  ondansetron (ZOFRAN) tablet 4 mg, 4 mg, Oral, Q6H PRN **OR** ondansetron (ZOFRAN) injection 4 mg, 4 mg, Intravenous, Q6H PRN, Harrie Foreman, MD .  pantoprazole (PROTONIX) 80 mg in sodium chloride 0.9 % 250 mL (0.32 mg/mL) infusion, 8 mg/hr, Intravenous, Continuous, Salary, Montell D, MD, Last Rate: 25 mL/hr at 04/06/18 1100, 8 mg/hr at 04/06/18 1100 .  phenol (CHLORASEPTIC) mouth spray 1 spray, 1 spray, Mouth/Throat, PRN, Salary, Montell D, MD  IMAGING    No results found.   MAJOR EVENTS/TEST RESULTS: CT abdomen/pelvis without contrast: 7/26  -shows SBO with transition zone in the mid pelvis  INDWELLING DEVICES::Purewick  MICRO DATA: MRSA PCR; negative  ANTIMICROBIALS:  No indication   ASSESSMENT/PLAN   77 year old female with atrial fibrillation, hypertension, DM, CKD stage II, gastric polyps, upper GI bleeding, and CHF who presented to the emergency department on 7/24 c/o abdominal pain, nausea and coffee ground emesis. Patient admitted for further management of SBO likely secondary to adhesions and upper GI bleed likely secondary to mallory weiss tear.   This morning patient had altered mental status with hypotension, hyperkalemia, and respiratory distress with hypercapnia. Patient placed on BiPAP and transferred to ICU.   PULMONARY A: acute hypercapnic respiratory failure P:  Likely secondary to hemorrhagic shock. Continue BiPAP. Currently on 60% FIO2 -CXR; none this admission -ABG: 7.09pH, CO2 69, O2 36. Will repeat.  -high risk for intubation   CARDIOVASCULAR A: Chronic atrial fibrillation   P: NSR on exam  -Controlled outpatient with amiodarone, and Eliquis. -Per cardiology hold PO amiodarone and Eliquis. May give IV amiodarone if needed. Patient with pacemaker -Last echo in system was 07/2015 with EF of 55-65%. -consider repeat echo with new onset acute respiratory failure if unable to wean from  BiPAP   RENAL A:  CKD stage III with AKI and hyperkalemia P:   AKI: Creatinine 2.82 today (baseline around 1.5) likely secondary to hypovolemia with upper GI bleed  -continue IVF hydration, blood pressure improved Hyperkalemia: calcium gluconate, bicarb, insulin, and D50 given. Repeat BMP at noon.   GASTROINTESTINAL A:  Upper GI bleed secondary to eliquis and possible Mallory Weiss tear with SBO P:  Management per surgery and Dr. Allen Norris -plan for upper endoscopy once patient is stable and eliquis is out of patient's system -NG tube for SBO on low intermittent suction. Further surgical involvement once GI bleed is resolved  HEMATOLOGIC A:  UGI bleed with hemorrhagic shock P: Hemoglobin 13.4 (7/24)-->8.1 this morning. 2 Units of PRBC infused. Recheck CBC at noon -Monitor H and H: transfuse as needed -SCDs for DVT prophylaxis   INFECTIOUS A:  No indication for abx at this time -afebrile, no leukocytosis. Will continue to trend.  ENDOCRINE A:  Insulin dependent diabetes mellitus P:  Continue sensitive SSI. Monitor blood glucose   NEUROLOGIC A:  Acute encephalopathy secondary to hypercapnia respiratory failure P:  Continue BiPAP, wean as tolerated. Serial neuro exams.    I have personally obtained a history, examined the patient, evaluated laboratory and independently reviewed  imaging results, formulated the assessment and plan and placed orders.  The Patient requires high complexity decision making for assessment and support, frequent evaluation and titration of therapies, application of advanced monitoring technologies and extensive interpretation of multiple databases. Critical Care Time devoted to patient care services described in this note is 52 minutes.   Overall, patient is critically ill, prognosis is guarded. Patient at high risk for cardiac arrest and death.   Hermelinda Dellen, DO

## 2018-04-06 NOTE — Progress Notes (Signed)
Patient unarousable at 0500. RRT called. MD ordered Narcan, labs and CT head. Bolus given, VSS. Patient woke for a moment and went back to deep sleep. Will continue to monitor patient.

## 2018-04-06 NOTE — Progress Notes (Signed)
PT family reported to RN that she is very prone to UTI's. Pt c/o of constant pain at catheter.  Spoke with Dr Jefferson Fuel. Foley DC.  [facility].

## 2018-04-06 NOTE — Progress Notes (Signed)
Pt transfer to ICU 13.

## 2018-04-06 NOTE — Progress Notes (Signed)
Family Meeting Note  Advance Directive:yes  Today a meeting took place with the Patient, patient's husband.  Patient is unable to participate due [id number] capacity Obtundation   The following clinical team members were present during this meeting:MD  The following were discussed:Patient's diagnosis: Acute respiratory failure, acute renal failure, acute GI bleeding, Patient's progosis: Unable to determine and Goals for treatment: Full Code  Additional follow-up to be provided: prn  Time spent during discussion:20 minutes  Gorden Harms, MD

## 2018-04-06 NOTE — Progress Notes (Signed)
Patient ID: Deborah Obrien, female   DOB: 09-Oct-1940, 77 y.o.   MRN: [id number] Pulmonary/critical care attending  Repeat potassium noted, withdrawn from a fingerstick with probable hemolysis. Patient is getting a PICC line placed will resend after PICC line in place  [employer], D.O.

## 2018-04-06 NOTE — Progress Notes (Signed)
[facility] [place] NAME: Deborah Obrien    MR#:  [id number]  DATE OF BIRTH:  1940-11-23  SUBJECTIVE:  CHIEF COMPLAINT:   Chief Complaint  Patient presents with  . Hematemesis  . Abdominal Pain  Patient had a rapid response done earlier this morning per nursing staff, patient is unresponsive this morning, blood pressure systolically in the 02I to 90s range, noted potassium 6.7-6.5 on repeat, creatinine 2.9 up from 1.5, hemoglobin down 8.1 from 13.4, blood gas noted for acute hypoxic hypercapnic respiratory failure, patient transferred to ICU for further evaluation/care  REVIEW OF SYSTEMS:  CONSTITUTIONAL: No fever, fatigue or weakness.  EYES: No blurred or double vision.  EARS, NOSE, AND THROAT: No tinnitus or ear pain.  RESPIRATORY: No cough, shortness of breath, wheezing or hemoptysis.  CARDIOVASCULAR: No chest pain, orthopnea, edema.  GASTROINTESTINAL: No nausea, vomiting, diarrhea or abdominal pain.  GENITOURINARY: No dysuria, hematuria.  ENDOCRINE: No polyuria, nocturia,  HEMATOLOGY: No anemia, easy bruising or bleeding SKIN: No rash or lesion. MUSCULOSKELETAL: No joint pain or arthritis.   NEUROLOGIC: No tingling, numbness, weakness.  PSYCHIATRY: No anxiety or depression.   ROS  DRUG ALLERGIES:   Allergies  Allergen Reactions  . Cephalosporins Other (See Comments)    Reaction:  Unknown   . Macrolides And Ketolides Other (See Comments)    Reaction:  Unknown   . Meperidine Shortness Of Breath, Nausea Only and Other (See Comments)    Reaction:  Stomach pain   . Prednisone Anaphylaxis and Other (See Comments)    Reaction:  Increases pts blood sugar  Pt states that she is allergic to all steroids.    . Shellfish Allergy Anaphylaxis, Shortness Of Breath, Diarrhea, Nausea Only and Rash    Reaction:  Stomach pain   . Sulfa Antibiotics Shortness Of Breath, Diarrhea, Nausea Only and Other (See Comments)    Reaction:  Stomach pain    . Uloric [Febuxostat] Anaphylaxis    Locks pt's body up   . Aspirin Other (See Comments)    Reaction:  Stomach pain   . Celecoxib Other (See Comments)    Reaction:  GI bleed, weakness, and stomach pain.    . Cephalexin Diarrhea, Nausea Only and Other (See Comments)    Reaction:  Stomach pain   . Erythromycin Diarrhea, Nausea Only and Other (See Comments)    Reaction:  Stomach pain   . Hydromorphone Other (See Comments)    Reaction:  Hypotension   . Iodinated Diagnostic Agents Rash and Other (See Comments)    Pt states that she is unable to have because she has chronic kidney disease.    Marland Kitchen Oxycodone Other (See Comments)    Reaction:  Stomach pain   . Atorvastatin Nausea Only and Other (See Comments)    Reaction:  Weakness   . Codeine Diarrhea, Nausea Only and Other (See Comments)    Reaction:  Stomach pain Pt tolerates morphine   . Doxycycline     Stomach pain   . Tape Other (See Comments)    Reaction:  Causes pts skin to tear  Pt states that she is able to use paper tape.    . Valdecoxib Swelling and Other (See Comments)    Pt states that her hands and feet swell.    . Iodine Rash    VITALS:  Blood pressure (!) 112/32, pulse 60, temperature 97.7 F (36.5 C), temperature source Axillary, resp. rate 15, height 5\' 6"  (1.676 m), weight  114.8 kg (253 lb 1.4 oz), SpO2 100 %.  PHYSICAL EXAMINATION:  GENERAL:  77 y.o.-year-old patient lying in the bed with no acute distress.  EYES: Pupils equal, round, reactive to light and accommodation. No scleral icterus. Extraocular muscles intact.  HEENT: Head atraumatic, normocephalic. Oropharynx and nasopharynx clear.  NECK:  Supple, no jugular venous distention. No thyroid enlargement, no tenderness.  LUNGS: Normal breath sounds bilaterally, no wheezing, rales,rhonchi or crepitation. No use of accessory muscles of respiration.  CARDIOVASCULAR: S1, S2 normal. No murmurs, rubs, or gallops.  ABDOMEN: Soft, nontender, nondistended. Bowel  sounds present. No organomegaly or mass.  EXTREMITIES: No pedal edema, cyanosis, or clubbing.  NEUROLOGIC: Cranial nerves II through XII are intact. Muscle strength 5/5 in all extremities. Sensation intact. Gait not checked.  PSYCHIATRIC: The patient is alert and oriented x 3.  SKIN: No obvious rash, lesion, or ulcer.   Physical Exam LABORATORY PANEL:   CBC Recent Labs  Lab 04/06/18 0509  WBC 8.8  HGB 8.1*  HCT 25.0*  PLT 179   ------------------------------------------------------------------------------------------------------------------  Chemistries  Recent Labs  Lab 04/06/18 0509 04/06/18 0759  NA 143 142  K 6.7* 6.8*  CL 114* 114*  CO2 22 23  GLUCOSE 244* 277*  BUN 73* 73*  CREATININE 2.90* 2.82*  CALCIUM 7.6* 7.8*  AST 27  --   ALT 19  --   ALKPHOS 70  --   BILITOT 0.7  --    ------------------------------------------------------------------------------------------------------------------  Cardiac Enzymes Recent Labs  Lab 04/04/18 2027  TROPONINI <0.03   ------------------------------------------------------------------------------------------------------------------  RADIOLOGY:  Ct Abdomen Pelvis Wo Contrast  Result Date: 04/04/2018 CLINICAL DATA:  Abdominal pain and vomiting. History of TIA and on Eliquis. History of appendectomy, cholecystectomy, and hysterectomy. EXAM: CT ABDOMEN AND PELVIS WITHOUT CONTRAST TECHNIQUE: Multidetector CT imaging of the abdomen and pelvis was performed following the standard protocol without IV contrast. COMPARISON:  01/25/2016 FINDINGS: Lower chest: Lung bases are clear. Cardiac enlargement. Coronary artery calcifications. Hepatobiliary: No focal liver abnormality is seen. Status post cholecystectomy. No biliary dilatation. Pancreas: Unremarkable. No pancreatic ductal dilatation or surrounding inflammatory changes. Spleen: Normal in size without focal abnormality. Adrenals/Urinary Tract: Adrenal glands are unremarkable.  Kidneys are normal, without renal calculi, focal lesion, or hydronephrosis. Bladder is unremarkable. Stomach/Bowel: Stomach is moderately distended with what appears to be ingested material. No gastric wall thickening. Mid and distal small bowel are moderately dilated and fluid-filled. No wall thickening is appreciated. Distal small bowel are decompressed with transition zone likely in the mid pelvis. Changes are likely to represent small bowel obstruction. Cause is not determined. Scattered stool throughout the colon. No colonic wall thickening or infiltrative changes. Appendix is surgically absent. Anastomosis at the rectosigmoid junction appears patent. Surgical clips in the pelvis. Vascular/Lymphatic: Aortic atherosclerosis. No enlarged abdominal or pelvic lymph nodes. Reproductive: Status post hysterectomy. No adnexal masses. Other: No abdominal wall hernia or abnormality. No abdominopelvic ascites. Musculoskeletal: Degenerative changes in the spine. Postoperative change with posterior rod and screw fixation from L4 to the sacrum. IMPRESSION: Dilated fluid-filled small bowel with transition zone in the mid pelvis consistent with small bowel obstruction. Cause is not identified. Aortic atherosclerosis. Electronically Signed   By: Lucienne Capers M.D.   On: 04/04/2018 21:33   Dg Abd 1 View  Result Date: 04/05/2018 CLINICAL DATA:  Nasogastric tube placement. EXAM: ABDOMEN - 1 VIEW COMPARISON:  04/04/2018 FINDINGS: An enteric tube terminates in the region of the distal gastric body, more distal than on the prior study. The lower  abdomen and pelvis were not imaged. No dilated loops of bowel are seen in the upper abdomen. Pacemaker leads are noted. Evaluation of the lung bases is limited by over penetration. There are surgical clips in the right upper abdomen. IMPRESSION: Enteric tube in the distal stomach. Electronically Signed   By: Logan Bores M.D.   On: 04/05/2018 10:10   Dg Abdomen 1 View  Result Date:  04/04/2018 CLINICAL DATA:  NG tube placement EXAM: ABDOMEN - 1 VIEW COMPARISON:  None. FINDINGS: Enteric tube tip in the left upper quadrant consistent with location in the body of the stomach. Mildly distended stomach. Mild cardiac enlargement. IMPRESSION: Enteric tube tip in the left upper quadrant consistent with location in the body of the stomach. Electronically Signed   By: Lucienne Capers M.D.   On: 04/04/2018 23:04   [place] Ekg Site Rite  Result Date: 04/06/2018 If Site Rite image not attached, placement could not be confirmed due to current cardiac rhythm.   ASSESSMENT AND PLAN:  This is a 77 year old female admitted for partial small bowel obstruction, subsequent development of, while on hematemesis Eliquis  *Acute hypoxic hypercapnic respiratory failure  With severe acute respiratory acidosis Transferred to ICU on April 06, 2018 Case discussed with intensivist, patient placed on BiPAP, will repeat blood gas in 4 hours, supplemental oxygen as needed, and continue close medical monitoring  *Acute worsening blood loss anemia Secondary to hematemesis from Mallory-Weiss tears Transfuse 2 units packed red blood cells, avoid NSAIDs/anticoagulants/antiplatelet agents, H&H every 8 hours, CBC daily, transfuse as indicated  *Acute hematemesis Most likely secondary to Mallory-Weiss tears Gastroenterology input greatly appreciated, continue Protonix drip, avoid NSAIDs/anticoagulants/antiplatelet agents, H&H every 8 hours, CBC daily, transfuse as indicated Unable to have endoscopy done given use of Eliquis prior to admission  *Acute partial small bowel obstruction  General surgery input appreciated, continue conservative management, NG tube placement for decompression, bowel rest, IV fluids for rehydration   *Acute toxic metabolic encephalopathy Secondary to multifactorial process which includes acute hypoxic hypercapnic respiratory failure, acute hypotension, partial small bowel obstruction,  acute renal failure  *Chronic A. fib  Stable  Eliquis on hold given active bleeding, cardiology following, continue amiodarone   *Chronic diabetes mellitus type 2  Stable  Continue sliding scale insulin with Accu-Cheks per routine   *Chronic hypertension  Currently hypotensive  Avoid antihypertensives, vitals per routine, and make changes as per necessary   *Acute kidney injury Exacerbated by worsening blood loss anemia/hematemesis Blood transfusion as stated above, IV fluids for rehydration, avoid nephrotoxic agents, strict I&O monitoring    All the records are reviewed and case discussed with Care Management/Social Workerr. Management plans discussed with the patient, family and they are in agreement.  CODE STATUS: full  TOTAL TIME TAKING CARE OF THIS PATIENT: 45 minutes.     POSSIBLE D/C IN 5 DAYS, DEPENDING ON CLINICAL CONDITION.   Avel Peace Salary M.D on 04/06/2018   Between 7am to 6pm - Pager - [phone]  After 6pm go to [email or web address] - password [email or web address] [facility]  Office  [phone]  CC: Primary care physician; [name], MD  Note: This dictation was prepared with Dragon dictation along with smaller phrase technology. Any transcriptional errors that result from this process are unintentional.

## 2018-04-06 NOTE — Progress Notes (Signed)
Inpatient Diabetes Program Recommendations  AACE/ADA: New Consensus Statement on Inpatient Glycemic Control (2019)  Target Ranges:  Prepandial:   less than 140 mg/dL      Peak postprandial:   less than 180 mg/dL (1-2 hours)      Critically ill patients:  140 - 180 mg/dL   Results for GEARLINE, SPILMAN (MRN [id number]) as of 04/06/2018 08:57  Ref. Range 04/05/2018 07:44 04/05/2018 11:54 04/05/2018 16:38 04/05/2018 18:25 04/05/2018 20:31 04/06/2018 00:07 04/06/2018 04:21 04/06/2018 07:32 04/06/2018 08:33  Glucose-Capillary Latest Ref Range: 70 - 99 mg/dL 241 (H) 224 (H) 182 (H) 169 (H) 155 (H) 194 (H) 214 (H) 263 (H) 235 (H)   Review of Glycemic Control  Outpatient Diabetes medications: 70/30 22 units BID Current orders for Inpatient glycemic control: Novolog 0-9 units Q4H  Inpatient Diabetes Program Recommendations: Insulin - Basal: Please consider ordering Lantus 10 units Q24H.  Thanks, Barnie Alderman, RN, MSN, CDE Diabetes Coordinator Inpatient Diabetes Program [phone] (Team Pager from 8am to 5pm)

## 2018-04-06 NOTE — Progress Notes (Signed)
Patient ID: MAKENZY KRIST, female   DOB: 04-16-1941, 77 y.o.   MRN: [id number]     [facility] Day(s): 1.   Post op day(s):  Marland Kitchen   Interval History: Patient seen and examined this morning and this afternoon. The patient had an episode of respiratory depression and was transferred to the ICU. No new episode of vomiting.   Vital signs in last 24 hours: [min-max] current  Temp:  [97.6 F (36.4 C)-99.1 F (37.3 C)] 98.6 F (37 C) (07/26 1905) Pulse Rate:  [59-81] 62 (07/26 1905) Resp:  [15-33] 26 (07/26 1905) BP: (80-135)/(28-93) 119/80 (07/26 1905) SpO2:  [97 %-100 %] 100 % (07/26 1905) Weight:  [114.8 kg (253 lb 1.4 oz)-115.6 kg (254 lb 13.6 oz)] 114.8 kg (253 lb 1.4 oz) (07/26 0958)     Height: 5\' 6"  (167.6 cm) Weight: 114.8 kg (253 lb 1.4 oz) BMI (Calculated): 40.87   Intake/Output this shift:  NGT: 750 mL   Physical Exam:  Gastrointestinal: soft, mild-tender, and distended. Obese.   Labs:  CBC Latest Ref Rng & Units 04/06/2018 04/06/2018 04/06/2018  WBC 3.6 - 11.0 K/uL - 8.8 -  Hemoglobin 12.0 - 16.0 g/dL 8.5(L) 8.1(L) 8.1(L)  Hematocrit 35.0 - 47.0 % 25.6(L) 25.0(L) 24.8(L)  Platelets 150 - 440 K/uL - 179 -   CMP Latest Ref Rng & Units 04/06/2018 04/06/2018 04/06/2018  Glucose 70 - 99 mg/dL 201(H) 256(H) 257(H)  BUN 8 - 23 mg/dL 72(H) 76(H) 76(H)  Creatinine 0.44 - 1.00 mg/dL 2.20(H) 2.62(H) 2.71(H)  Sodium 135 - 145 mmol/L 146(H) 145 145  Potassium 3.5 - 5.1 mmol/L 5.2(H) 5.7(H) 7.4(HH)  Chloride 98 - 111 mmol/L 119(H) 114(H) 117(H)  CO2 22 - 32 mmol/L 23 24 21(L)  Calcium 8.9 - 10.3 mg/dL 7.2(L) 7.9(L) 7.9(L)  Total Protein 6.5 - 8.1 g/dL - - -  Total Bilirubin 0.3 - 1.2 mg/dL - - -  Alkaline Phos 38 - 126 U/L - - -  AST 15 - 41 U/L - - -  ALT 0 - 44 U/L - - -   Imaging studies: No new pertinent imaging studies  Assessment/Plan:  77 y.o.femalewith small bowel obstruction and upper GI bleeding, complicated by pertinent comorbidities  includingatrial fibrillation on anticoagulation, CHF, sinus node dysfunction with pacemaker, chronic kidney disease, morbid obesity, history of bleeding gastric polyps.  Patient did not had new episode of vomiting. Hemoglobin stable. Still with significant about of drain per NGT. Will continue with conservative management of small bowel obstruction. Agree with transfer to ICU and current management.    Arnold Long, MD

## 2018-04-07 ENCOUNTER — Inpatient Hospital Stay: Payer: Medicare Other

## 2018-04-07 LAB — TYPE AND SCREEN
ABO/RH(D): A POS
Antibody Screen: NEGATIVE
UNIT DIVISION: 0
Unit division: 0
Unit division: 0
Unit division: 0
Unit division: 0

## 2018-04-07 LAB — GLUCOSE, CAPILLARY
Glucose-Capillary: 138 mg/dL — ABNORMAL HIGH (ref 70–99)
Glucose-Capillary: 220 mg/dL — ABNORMAL HIGH (ref 70–99)
Glucose-Capillary: 220 mg/dL — ABNORMAL HIGH (ref 70–99)
Glucose-Capillary: 226 mg/dL — ABNORMAL HIGH (ref 70–99)

## 2018-04-07 LAB — CBC
HCT: 27.3 % — ABNORMAL LOW (ref 35.0–47.0)
HEMATOCRIT: 26.6 % — AB (ref 35.0–47.0)
HEMOGLOBIN: 9.1 g/dL — AB (ref 12.0–16.0)
Hemoglobin: 8.8 g/dL — ABNORMAL LOW (ref 12.0–16.0)
MCH: 32.2 pg (ref 26.0–34.0)
MCH: 32.7 pg (ref 26.0–34.0)
MCHC: 33.2 g/dL (ref 32.0–36.0)
MCHC: 33.2 g/dL (ref 32.0–36.0)
MCV: 97.2 fL (ref 80.0–100.0)
MCV: 98.5 fL (ref 80.0–100.0)
PLATELETS: 133 10*3/uL — AB (ref 150–440)
Platelets: 127 10*3/uL — ABNORMAL LOW (ref 150–440)
RBC: 2.71 MIL/uL — ABNORMAL LOW (ref 3.80–5.20)
RBC: 2.81 MIL/uL — AB (ref 3.80–5.20)
RDW: 16.1 % — ABNORMAL HIGH (ref 11.5–14.5)
RDW: 16.5 % — AB (ref 11.5–14.5)
WBC: 6.9 10*3/uL (ref 3.6–11.0)
WBC: 5.8 10*3/uL (ref 3.6–11.0)

## 2018-04-07 LAB — BASIC METABOLIC PANEL
Anion gap: 6 (ref 5–15)
Anion gap: 4 — ABNORMAL LOW (ref 5–15)
BUN: 76 mg/dL — ABNORMAL HIGH (ref 8–23)
BUN: 76 mg/dL — AB (ref 8–23)
CALCIUM: 8.1 mg/dL — AB (ref 8.9–10.3)
CHLORIDE: 117 mmol/L — AB (ref 98–111)
CO2: 24 mmol/L (ref 22–32)
CO2: 25 mmol/L (ref 22–32)
CREATININE: 2.09 mg/dL — AB (ref 0.44–1.00)
Calcium: 7.7 mg/dL — ABNORMAL LOW (ref 8.9–10.3)
Chloride: 119 mmol/L — ABNORMAL HIGH (ref 98–111)
Creatinine, Ser: 2.1 mg/dL — ABNORMAL HIGH (ref 0.44–1.00)
GFR calc Af Amer: 25 mL/min — ABNORMAL LOW (ref >60)
GFR, EST AFRICAN AMERICAN: 25 mL/min — AB (ref >60)
GFR, EST NON AFRICAN AMERICAN: 22 mL/min — AB (ref >60)
GFR, EST NON AFRICAN AMERICAN: 22 mL/min — AB (ref >60)
Glucose, Bld: 229 mg/dL — ABNORMAL HIGH (ref 70–99)
Glucose, Bld: 165 mg/dL — ABNORMAL HIGH (ref 70–99)
POTASSIUM: 5.8 mmol/L — AB (ref 3.5–5.1)
Potassium: 5.5 mmol/L — ABNORMAL HIGH (ref 3.5–5.1)
SODIUM: 148 mmol/L — AB (ref 135–145)
SODIUM: 147 mmol/L — AB (ref 135–145)

## 2018-04-07 LAB — BPAM RBC
BLOOD PRODUCT EXPIRATION DATE: 201908042359
Blood Product Expiration Date: 201908012359
Blood Product Expiration Date: 201908042359
Blood Product Expiration Date: 201908052359
Blood Product Expiration Date: 201908182359
ISSUE DATE / TIME: 201907260953
ISSUE DATE / TIME: 201907261559
UNIT TYPE AND RH: 600
UNIT TYPE AND RH: 600
UNIT TYPE AND RH: 6200
UNIT TYPE AND RH: 6200
Unit Type and Rh: 600

## 2018-04-07 LAB — PREPARE RBC (CROSSMATCH)

## 2018-04-07 MED ORDER — DEXMEDETOMIDINE HCL IN NACL 400 MCG/100ML IV SOLN
0.0000 ug/kg/h | INTRAVENOUS | Status: DC
  Administered 2018-04-07: 0.2 ug/kg/h via INTRAVENOUS
  Administered 2018-04-08 (×2): 0.3 ug/kg/h via INTRAVENOUS
  Administered 2018-04-11: 0.2 ug/kg/h via INTRAVENOUS
  Administered 2018-04-12: 0.4 ug/kg/h via INTRAVENOUS
  Administered 2018-04-12: 0.8 ug/kg/h via INTRAVENOUS
  Administered 2018-04-12: 0.5 ug/kg/h via INTRAVENOUS
  Administered 2018-04-12: 1 ug/h via INTRAVENOUS
  Administered 2018-04-13 (×2): 0.8 ug/kg/h via INTRAVENOUS
  Filled 2018-04-07 (×10): qty 100

## 2018-04-07 MED ORDER — INSULIN ASPART 100 UNIT/ML IV SOLN
10.0000 [IU] | Freq: Once | INTRAVENOUS | Status: AC
  Administered 2018-04-07: 10 [IU] via INTRAVENOUS
  Filled 2018-04-07: qty 0.1

## 2018-04-07 MED ORDER — SODIUM CHLORIDE 0.9 % IV SOLN
1.0000 g | Freq: Once | INTRAVENOUS | Status: AC
  Administered 2018-04-07: 1 g via INTRAVENOUS
  Filled 2018-04-07: qty 10

## 2018-04-07 MED ORDER — DEXTROSE 50 % IV SOLN
1.0000 | Freq: Once | INTRAVENOUS | Status: AC
  Administered 2018-04-07: 50 mL via INTRAVENOUS
  Filled 2018-04-07: qty 50

## 2018-04-07 NOTE — Progress Notes (Signed)
[facility] [place] NAME: Deborah Obrien    MR#:  [id number]  DATE OF BIRTH:  07/11/1941  SUBJECTIVE:   patient was transferred to the intensive care unit due to hypotension, worsening electrolyte abnormalities and acute kidney injury and also hypercapnic respiratory failure.  Patient is now off BiPAP.  Remains confused.  Still has NG tube due to partial SBO.  Patient denies any new complaints presently.  REVIEW OF SYSTEMS:    Review of Systems  Constitutional: Negative for chills and fever.  HENT: Negative for congestion and tinnitus.   Eyes: Negative for blurred vision and double vision.  Respiratory: Negative for cough, shortness of breath and wheezing.   Cardiovascular: Negative for chest pain, orthopnea and PND.  Gastrointestinal: Negative for abdominal pain, diarrhea, nausea and vomiting.  Genitourinary: Negative for dysuria and hematuria.  Neurological: Negative for dizziness, sensory change and focal weakness.  All other systems reviewed and are negative.   Nutrition: NPO Tolerating Diet: No Tolerating PT: Await Eval.   DRUG ALLERGIES:   Allergies  Allergen Reactions  . Cephalosporins Other (See Comments)    Reaction:  Unknown   . Macrolides And Ketolides Other (See Comments)    Reaction:  Unknown   . Meperidine Shortness Of Breath, Nausea Only and Other (See Comments)    Reaction:  Stomach pain   . Prednisone Anaphylaxis and Other (See Comments)    Reaction:  Increases pts blood sugar  Pt states that she is allergic to all steroids.    . Shellfish Allergy Anaphylaxis, Shortness Of Breath, Diarrhea, Nausea Only and Rash    Reaction:  Stomach pain   . Sulfa Antibiotics Shortness Of Breath, Diarrhea, Nausea Only and Other (See Comments)    Reaction:  Stomach pain   . Uloric [Febuxostat] Anaphylaxis    Locks pt's body up   . Aspirin Other (See Comments)    Reaction:  Stomach pain   . Celecoxib Other (See Comments)     Reaction:  GI bleed, weakness, and stomach pain.    . Cephalexin Diarrhea, Nausea Only and Other (See Comments)    Reaction:  Stomach pain   . Erythromycin Diarrhea, Nausea Only and Other (See Comments)    Reaction:  Stomach pain   . Hydromorphone Other (See Comments)    Reaction:  Hypotension   . Iodinated Diagnostic Agents Rash and Other (See Comments)    Pt states that she is unable to have because she has chronic kidney disease.    Marland Kitchen Oxycodone Other (See Comments)    Reaction:  Stomach pain   . Atorvastatin Nausea Only and Other (See Comments)    Reaction:  Weakness   . Codeine Diarrhea, Nausea Only and Other (See Comments)    Reaction:  Stomach pain Pt tolerates morphine   . Doxycycline     Stomach pain   . Tape Other (See Comments)    Reaction:  Causes pts skin to tear  Pt states that she is able to use paper tape.    . Valdecoxib Swelling and Other (See Comments)    Pt states that her hands and feet swell.    . Iodine Rash    VITALS:  Blood pressure (!) 125/47, pulse (!) 47, temperature 98.5 F (36.9 C), temperature source Oral, resp. rate (!) 24, height 5\' 6"  (1.676 m), weight 115.2 kg (253 lb 15.5 oz), SpO2 96 %.  PHYSICAL EXAMINATION:   Physical Exam  GENERAL:  77 y.o.-year-old obese  patient lying in bed confused but in NAD.  EYES: Pupils equal, round, reactive to light and accommodation. No scleral icterus. Extraocular muscles intact.  HEENT: Head atraumatic, normocephalic. NG tube in place.  NECK:  Supple, no jugular venous distention. No thyroid enlargement, no tenderness.  LUNGS: Normal breath sounds bilaterally, no wheezing, rales, rhonchi. No use of accessory muscles of respiration.  CARDIOVASCULAR: S1, S2 normal. No murmurs, rubs, or gallops.  ABDOMEN: Soft, Tender diffusely but no rebound, rigidity, nondistended. Bowel sounds present. No organomegaly or mass.  EXTREMITIES: No cyanosis, clubbing or edema b/l.    NEUROLOGIC: Cranial nerves II through XII  are intact. No focal Motor or sensory deficits b/l.  Globally weak.  PSYCHIATRIC: The patient is alert and oriented x 1.  SKIN: No obvious rash, lesion, or ulcer.    LABORATORY PANEL:   CBC Recent Labs  Lab 04/07/18 1253  WBC 6.9  HGB 8.8*  HCT 26.6*  PLT 133*   ------------------------------------------------------------------------------------------------------------------  Chemistries  Recent Labs  Lab 04/06/18 0509  04/07/18 0817  NA 143   < > 148*  K 6.7*   < > 5.5*  CL 114*   < > 119*  CO2 22   < > 25  GLUCOSE 244*   < > 165*  BUN 73*   < > 76*  CREATININE 2.90*   < > 2.09*  CALCIUM 7.6*   < > 8.1*  AST 27  --   --   ALT 19  --   --   ALKPHOS 70  --   --   BILITOT 0.7  --   --    < > = values in this interval not displayed.   ------------------------------------------------------------------------------------------------------------------  Cardiac Enzymes Recent Labs  Lab 04/04/18 2027  TROPONINI <0.03   ------------------------------------------------------------------------------------------------------------------  RADIOLOGY:  Dg Chest Port 1 View  Result Date: 04/06/2018 CLINICAL DATA:  Status post central line placement today. EXAM: PORTABLE CHEST 1 VIEW COMPARISON:  Single-view of the chest 02/04/2018. FINDINGS: NG tube courses into the stomach and below the inferior margin of the film. Right PICC is identified with the tip in the mid to lower superior vena cava. Left basilar airspace disease identified. Mild subsegmental atelectasis is seen in the right lung base. No pneumothorax or pleural effusion. Heart size is upper normal. Aortic atherosclerosis and pacing device are noted. IMPRESSION: Tip of right PICC is in the mid to lower superior vena cava. Left basilar airspace disease could be due to atelectasis or pneumonia. Mild subsegmental atelectasis right lung base noted. Atherosclerosis. Electronically Signed   By: Inge Rise M.D.   On:  04/06/2018 15:19   Dg Abd Portable 1v  Result Date: 04/07/2018 CLINICAL DATA:  77 year old female with a history of small bowel obstruction EXAM: PORTABLE ABDOMEN - 1 VIEW COMPARISON:  04/05/2018, 04/04/2018, CT 04/04/2018 FINDINGS: Gastric tube terminates within the left upper quadrant. Cholecystectomy. Surgical changes in the low abdomen/pelvis. Surgical changes of prior lumbar fixation. Gas and formed stool within the right colon.  No colonic distention. No distended small bowel loops. IMPRESSION: Nonobstructive bowel gas pattern. Gastric tube terminates within the left upper quadrant. Electronically Signed   By: Corrie Mckusick D.O.   On: 04/07/2018 12:24   [place] Ekg Site Rite  Result Date: 04/06/2018 If Site Rite image not attached, placement could not be confirmed due to current cardiac rhythm.    ASSESSMENT AND PLAN:   77 year old female with past medical history of paroxysmal atrial fibrillation status post pacemaker, previous history  of TIA, hypertension, morbid obesity, diabetes, chronic kidney disease stage III, anxiety, GERD who presented to the hospital due to abdominal pain and noted to have partial small bowel obstruction.  1.  Acute on chronic respiratory failure with hypoxia and hypercapnia- due to underlying COPD, morbid obesity. - Improved with BiPAP and patient is more awake and alert now.  2.  Acute partial small bowel obstruction- because of patient's worsening abdominal pain. - Seen by general surgery and no plans for surgical intervention.  Continue NG tube decompression, supportive care with IV fluids, antiemetics.  Patient's abdominal x-ray this morning showing nonobstructive bowel gas pattern. -Continue further care as per general surgery.  3.  Acute hematemesis-secondary to GI bleed/Mallory-Weiss tear. -Seen by gastroenterology and no plans for endoscopy at this time.  Continue Protonix drip, follow hemoglobins which have been currently stable.  4.  Acute blood loss  anemia-secondary to the Mallory-Weiss tear. - Patient was transfused a total of 2 units of packed red blood cells and hemoglobin is improved posttransfusion we will continue to monitor.  No further bleeding overnight.  5.  Altered mental status/encephalopathy-this is metabolic encephalopathy secondary to the hypercapnic respiratory failure.  Mental status has improved since yesterday after BiPAP therapy.  6.  Hyperkalemia-secondary to the acute on chronic renal failure.  Much improved with IV fluids, calcium, dextrose, insulin -Potassium level trending down his renal function is improving.  7.  Chronic atrial fibrillation- holding amiodarone and beta-blocker due to relative hypotension from the volume loss and GI bleed. - Hold Eliquis for now.     All the records are reviewed and case discussed with Care Management/Social Worker. Management plans discussed with the patient, family and they are in agreement.  CODE STATUS: Full code  DVT Prophylaxis: Ted's & SCD's.   TOTAL TIME TAKING CARE OF THIS PATIENT: 30 minutes.   POSSIBLE D/C unclear, DEPENDING ON CLINICAL CONDITION and course.   Henreitta Leber M.D on 04/07/2018 at 3:16 PM  Between 7am to 6pm - Pager - [phone]  After 6pm go to [email or web address] - [facility]  Office  [phone]  CC: Primary care physician; [name], MD

## 2018-04-07 NOTE — Progress Notes (Signed)
Pt asleep and resting comfortably in bed. Denies pain at this time.

## 2018-04-07 NOTE — Progress Notes (Addendum)
Paged  Dr. Angelena Form, Cardiology for Possible A-fib, and Pacer not capturing. Strips printed and added to chart. Dr. Jefferson Fuel notified. No new orders at this time.

## 2018-04-07 NOTE — Progress Notes (Addendum)
Pt restless in bed ad complaining of sharp pain in her left ABD. Pt unable to rate pt in 1-10 pain scale, confused and responds inappropriately.  Notified Dr. Jefferson Fuel and gave PRN Tylenol supp. No new orders at this time. Will continue to monitor.

## 2018-04-07 NOTE — Progress Notes (Signed)
Patient ID: Deborah Obrien, female   DOB: Nov 26, 1940, 77 y.o.   MRN: [id number]     [facility] Day(s): 2.   Post op day(s):  Marland Kitchen   Interval History: Patient seen and examined, no acute events or new complaints overnight. Patient reports feeling better. As per husband patient has not had more vomiting and had two bowel movements last night.   Vital signs in last 24 hours: [min-max] current  Temp:  [97.7 F (36.5 C)-99.1 F (37.3 C)] 98.4 F (36.9 C) (07/27 0800) Pulse Rate:  [58-73] 59 (07/27 1000) Resp:  [15-33] 18 (07/27 1000) BP: (98-135)/(28-107) 131/45 (07/27 1000) SpO2:  [92 %-100 %] 100 % (07/27 0900) Weight:  [115.2 kg (253 lb 15.5 oz)] 115.2 kg (253 lb 15.5 oz) (07/27 0500)     Height: 5\' 6"  (167.6 cm) Weight: 115.2 kg (253 lb 15.5 oz) BMI (Calculated): 41.01   Physical Exam:  Constitutional: alert, cooperative and no distress  Gastrointestinal: soft, mild-tender, and non-distended. obese  Labs:  CBC Latest Ref Rng & Units 04/07/2018 04/06/2018 04/06/2018  WBC 3.6 - 11.0 K/uL 5.8 - 8.8  Hemoglobin 12.0 - 16.0 g/dL 9.1(L) 8.5(L) 8.1(L)  Hematocrit 35.0 - 47.0 % 27.3(L) 25.6(L) 25.0(L)  Platelets 150 - 440 K/uL 127(L) - 179   CMP Latest Ref Rng & Units 04/07/2018 04/07/2018 04/06/2018  Glucose 70 - 99 mg/dL 165(H) 229(H) 201(H)  BUN 8 - 23 mg/dL 76(H) 76(H) 72(H)  Creatinine 0.44 - 1.00 mg/dL 2.09(H) 2.10(H) 2.20(H)  Sodium 135 - 145 mmol/L 148(H) 147(H) 146(H)  Potassium 3.5 - 5.1 mmol/L 5.5(H) 5.8(H) 5.2(H)  Chloride 98 - 111 mmol/L 119(H) 117(H) 119(H)  CO2 22 - 32 mmol/L 25 24 23   Calcium 8.9 - 10.3 mg/dL 8.1(L) 7.7(L) 7.2(L)  Total Protein 6.5 - 8.1 g/dL - - -  Total Bilirubin 0.3 - 1.2 mg/dL - - -  Alkaline Phos 38 - 126 U/L - - -  AST 15 - 41 U/L - - -  ALT 0 - 44 U/L - - -    Assessment/Plan:  77 y.o.femalewith small bowel obstruction and upper GI bleeding, complicated by pertinent comorbidities includingatrial fibrillation on  anticoagulation, CHF, sinus node dysfunction with pacemaker, chronic kidney disease, morbid obesity, history of bleeding gastric polyps.  Patient did not had new episode of vomiting. Hemoglobin increased to 9.1. This are sign that she is not bleeding. Had two bowel movements yesterday. Will repeat labs to see if we can do an NGT camp trial. Will continue with conservative management of small bowel obstruction. Agree with transfer to ICU and current management.    Arnold Long, MD

## 2018-04-07 NOTE — Progress Notes (Signed)
Asleep on assessment but easy to arouse and has been sleeping during most of the shift only awaking when aroused. Pt is confused O x 3, disoriented to time. Remains on 2 L [place], regular unlabored breathing. Pt remains on NS at 125 ML/HR and Protonix at 25 ML/HR. NG hooked to LIS with 25 ML out, brown and bloody. ABD soft and pt denies pain. Vital signs are stable, will continue to monitor.

## 2018-04-07 NOTE — Progress Notes (Signed)
Follow up - Critical Care Medicine Note  Patient Details:    Deborah Obrien is an 77 y.o. female. PMH of atrial fibrillation, hypertension, DM, CKD stage III, gastric polyps, upper GI bleeding, and CHF who presented to the emergency department on 7/24 c/o abdominal pain, nausea and coffee ground emesis that had started earlier in the day. She endorses taking eliquis for her atrial fibrillation with last dose being 7/24 in the morning. In the ED patient underwent CT scan which showed SBO likely secondary to surgical adhesions. An NG tube was placed and surgery consulted for further management. Patient was admitted to the hospital for upper GI bleed and SBO. Transferred to the ICU for decreasing hemoglobin along with altered mental status. Briefly require BiPAP, presently weaned to nasal cannula doing well    Lines, Airways, Drains: PICC Triple Lumen 04/06/18 PICC Right Brachial 37 cm 0 cm (Active)  Indication for Insertion or Continuance of Line Poor Vasculature-patient has had multiple peripheral attempts or PIVs lasting less than 24 hours 04/06/2018  2:55 PM  Exposed Catheter (cm) 0 cm 04/06/2018  2:55 PM  Site Assessment Clean;Dry;Intact 04/06/2018  2:55 PM  Lumen #1 Status Flushed;Saline locked;Blood return noted 04/06/2018  2:55 PM  Lumen #2 Status Flushed;Saline locked;Blood return noted 04/06/2018  2:55 PM  Lumen #3 Status Flushed;Saline locked;Blood return noted 04/06/2018  2:55 PM  Dressing Type Transparent 04/06/2018  2:55 PM  Dressing Status Clean;Dry;Intact;Antimicrobial disc in place 04/06/2018  2:55 PM  Dressing Change Due 04/13/18 04/06/2018  2:55 PM     NG/OG Tube Nasogastric 14 Fr. (Active)  Site Assessment Clean;Dry;Intact 04/07/2018  2:40 AM  Status Suction-low intermittent 04/07/2018  2:40 AM  Drainage Appearance Bloody;Brown 04/07/2018  2:40 AM  Intake (mL) 70 mL 04/06/2018  3:30 AM  Output (mL) 100 mL 04/06/2018  5:00 AM     External Urinary Catheter (Active)  Collection  Container Dedicated Suction Canister 04/07/2018  2:40 AM  Securement Method Tape 04/07/2018  2:40 AM  Intervention Equipment Changed 04/07/2018 12:00 AM    Anti-infectives:  Anti-infectives (From admission, onward)   None      Microbiology: Results for orders placed or performed during the hospital encounter of 04/04/18  MRSA PCR Screening     Status: None   Collection Time: 04/06/18  9:05 AM  Result Value Ref Range Status   MRSA by PCR NEGATIVE NEGATIVE Final    Comment:        The GeneXpert MRSA Assay (FDA approved for NASAL specimens only), is one component of a comprehensive MRSA colonization surveillance program. It is not intended to diagnose MRSA infection nor to guide or monitor treatment for MRSA infections. Performed at [facility], [address] [place], [place] [address]    Studies: Ct Abdomen Pelvis Wo Contrast  Result Date: 04/04/2018 CLINICAL DATA:  Abdominal pain and vomiting. History of TIA and on Eliquis. History of appendectomy, cholecystectomy, and hysterectomy. EXAM: CT ABDOMEN AND PELVIS WITHOUT CONTRAST TECHNIQUE: Multidetector CT imaging of the abdomen and pelvis was performed following the standard protocol without IV contrast. COMPARISON:  01/25/2016 FINDINGS: Lower chest: Lung bases are clear. Cardiac enlargement. Coronary artery calcifications. Hepatobiliary: No focal liver abnormality is seen. Status post cholecystectomy. No biliary dilatation. Pancreas: Unremarkable. No pancreatic ductal dilatation or surrounding inflammatory changes. Spleen: Normal in size without focal abnormality. Adrenals/Urinary Tract: Adrenal glands are unremarkable. Kidneys are normal, without renal calculi, focal lesion, or hydronephrosis. Bladder is unremarkable. Stomach/Bowel: Stomach is moderately distended with what appears to  be ingested material. No gastric wall thickening. Mid and distal small bowel are moderately dilated and fluid-filled. No wall thickening is  appreciated. Distal small bowel are decompressed with transition zone likely in the mid pelvis. Changes are likely to represent small bowel obstruction. Cause is not determined. Scattered stool throughout the colon. No colonic wall thickening or infiltrative changes. Appendix is surgically absent. Anastomosis at the rectosigmoid junction appears patent. Surgical clips in the pelvis. Vascular/Lymphatic: Aortic atherosclerosis. No enlarged abdominal or pelvic lymph nodes. Reproductive: Status post hysterectomy. No adnexal masses. Other: No abdominal wall hernia or abnormality. No abdominopelvic ascites. Musculoskeletal: Degenerative changes in the spine. Postoperative change with posterior rod and screw fixation from L4 to the sacrum. IMPRESSION: Dilated fluid-filled small bowel with transition zone in the mid pelvis consistent with small bowel obstruction. Cause is not identified. Aortic atherosclerosis. Electronically Signed   By: Lucienne Capers M.D.   On: 04/04/2018 21:33   Dg Abd 1 View  Result Date: 04/05/2018 CLINICAL DATA:  Nasogastric tube placement. EXAM: ABDOMEN - 1 VIEW COMPARISON:  04/04/2018 FINDINGS: An enteric tube terminates in the region of the distal gastric body, more distal than on the prior study. The lower abdomen and pelvis were not imaged. No dilated loops of bowel are seen in the upper abdomen. Pacemaker leads are noted. Evaluation of the lung bases is limited by over penetration. There are surgical clips in the right upper abdomen. IMPRESSION: Enteric tube in the distal stomach. Electronically Signed   By: Logan Bores M.D.   On: 04/05/2018 10:10   Dg Abdomen 1 View  Result Date: 04/04/2018 CLINICAL DATA:  NG tube placement EXAM: ABDOMEN - 1 VIEW COMPARISON:  None. FINDINGS: Enteric tube tip in the left upper quadrant consistent with location in the body of the stomach. Mildly distended stomach. Mild cardiac enlargement. IMPRESSION: Enteric tube tip in the left upper quadrant  consistent with location in the body of the stomach. Electronically Signed   By: Lucienne Capers M.D.   On: 04/04/2018 23:04   Dg Chest Port 1 View  Result Date: 04/06/2018 CLINICAL DATA:  Status post central line placement today. EXAM: PORTABLE CHEST 1 VIEW COMPARISON:  Single-view of the chest 02/04/2018. FINDINGS: NG tube courses into the stomach and below the inferior margin of the film. Right PICC is identified with the tip in the mid to lower superior vena cava. Left basilar airspace disease identified. Mild subsegmental atelectasis is seen in the right lung base. No pneumothorax or pleural effusion. Heart size is upper normal. Aortic atherosclerosis and pacing device are noted. IMPRESSION: Tip of right PICC is in the mid to lower superior vena cava. Left basilar airspace disease could be due to atelectasis or pneumonia. Mild subsegmental atelectasis right lung base noted. Atherosclerosis. Electronically Signed   By: Inge Rise M.D.   On: 04/06/2018 15:19   [place] Ekg Site Rite  Result Date: 04/06/2018 If Site Rite image not attached, placement could not be confirmed due to current cardiac rhythm.   Consults: Treatment Team:  Herbert Pun, MD Wellington Hampshire, MD Hermelinda Dellen, DO   Subjective:    Overnight Issues: no significant issues overnight, was weaned off BiPAP. multiple bowel movements, no evidence of active bleeding  Objective:  Vital signs for last 24 hours: Temp:  [97.7 F (36.5 C)-99.1 F (37.3 C)] 98.9 F (37.2 C) (07/26 2200) Pulse Rate:  [58-73] 58 (07/27 0700) Resp:  [15-33] 17 (07/27 0700) BP: (94-135)/(28-107) 120/43 (07/27 0700) SpO2:  [92 %-100 %]  100 % (07/27 0700) Weight:  [253 lb 1.4 oz (114.8 kg)-253 lb 15.5 oz (115.2 kg)] 253 lb 15.5 oz (115.2 kg) (07/27 0500)  Hemodynamic parameters for last 24 hours:    Intake/Output from previous day: 07/26 0701 - 07/27 0700 In: 4719.5 [I.V.:4330; Blood:340; IV Piggyback:49.5] Out: 855  [Urine:855]  Intake/Output this shift: No intake/output data recorded.  Vent settings for last 24 hours:    Physical Exam:  Elderly female, awake alert oriented in no acute distress HENT: no oral lesions noted, trachea midline, no accessory muscle utilization  Cardiovascular: S1 and S2. Regular rate and rhythm. No murmurs, rubs, gallops. 2+ radial pulses bilaterally, 1+ dorsalis pedis pulses bilaterally  Pulmonary/Chest: clear to auscultation Abdominal: Positive bowel sounds, soft exam  Musculoskeletal: no clubbing cyanosis, 1 plus edema Neurological:  Follows verbal commands.   Assessment/Plan:   Gastrointestinal bleeding. No further evidence of active bleeding, no hematemesis, multiple bowel movements initially some blood then negative. Presumptive Mallory-Weiss tear from vomiting. Hemoglobin is stable at 9.1  Partial small bowel obstruction on CT scan. Will repeat KUB  Hypercapnic respiratory failure. Briefly required BiPAP, now awake alert without any difficulties  Diabetes mellitus. On sliding scale  Hyperkalemia. 5.5 this morning  Renal failure. Acute on chronic. BUNs 76/creatinine 2.09. Has been improving   Trev Boley 04/07/2018

## 2018-04-07 NOTE — Progress Notes (Signed)
HR 130's. HR irregular. Pt asymptomatic. Notified Dr. Jefferson Fuel. No new orders at this time.

## 2018-04-08 ENCOUNTER — Inpatient Hospital Stay: Payer: Medicare Other

## 2018-04-08 LAB — CBC
HCT: 26.3 % — ABNORMAL LOW (ref 35.0–47.0)
Hemoglobin: 8.8 g/dL — ABNORMAL LOW (ref 12.0–16.0)
MCH: 32.7 pg (ref 26.0–34.0)
MCHC: 33.3 g/dL (ref 32.0–36.0)
MCV: 98.2 fL (ref 80.0–100.0)
PLATELETS: 136 10*3/uL — AB (ref 150–440)
RBC: 2.68 MIL/uL — ABNORMAL LOW (ref 3.80–5.20)
RDW: 16.4 % — AB (ref 11.5–14.5)
WBC: 6.1 10*3/uL (ref 3.6–11.0)

## 2018-04-08 LAB — BASIC METABOLIC PANEL
Anion gap: 2 — ABNORMAL LOW (ref 5–15)
BUN: 75 mg/dL — AB (ref 8–23)
CALCIUM: 8.2 mg/dL — AB (ref 8.9–10.3)
CO2: 24 mmol/L (ref 22–32)
CREATININE: 1.86 mg/dL — AB (ref 0.44–1.00)
Chloride: 122 mmol/L — ABNORMAL HIGH (ref 98–111)
GFR calc Af Amer: 29 mL/min — ABNORMAL LOW (ref >60)
GFR, EST NON AFRICAN AMERICAN: 25 mL/min — AB (ref >60)
GLUCOSE: 170 mg/dL — AB (ref 70–99)
Potassium: 5.3 mmol/L — ABNORMAL HIGH (ref 3.5–5.1)
SODIUM: 148 mmol/L — AB (ref 135–145)

## 2018-04-08 LAB — GLUCOSE, CAPILLARY
Glucose-Capillary: 155 mg/dL — ABNORMAL HIGH (ref 70–99)
Glucose-Capillary: 156 mg/dL — ABNORMAL HIGH (ref 70–99)
Glucose-Capillary: 134 mg/dL — ABNORMAL HIGH (ref 70–99)
Glucose-Capillary: 99 mg/dL (ref 70–99)
Glucose-Capillary: 117 mg/dL — ABNORMAL HIGH (ref 70–99)
Glucose-Capillary: 111 mg/dL — ABNORMAL HIGH (ref 70–99)

## 2018-04-08 LAB — MAGNESIUM: MAGNESIUM: 1.7 mg/dL (ref 1.7–2.4)

## 2018-04-08 LAB — PHOSPHORUS: PHOSPHORUS: 3.5 mg/dL (ref 2.5–4.6)

## 2018-04-08 NOTE — Progress Notes (Signed)
Patient ID: Deborah Obrien, female   DOB: 1941/09/07, 77 y.o.   MRN: [id number]     [facility] Day(s): 3.   Post op day(s):  Marland Kitchen   Interval History: Patient seen and examined, no acute events or new complaints overnight. Patient's husband refers patient had multiple bowel movements, denies new episode of vomiting.    Vital signs in last 24 hours: [min-max] current  Temp:  [97.7 F (36.5 C)-98.5 F (36.9 C)] 98.5 F (36.9 C) (07/28 0800) Pulse Rate:  [47-92] 58 (07/28 0900) Resp:  [16-31] 24 (07/28 0900) BP: (69-131)/(30-52) 109/51 (07/28 0900) SpO2:  [90 %-100 %] 100 % (07/28 0900) Weight:  [114.1 kg (251 lb 8.7 oz)] 114.1 kg (251 lb 8.7 oz) (07/28 0609)     Height: 5\' 6"  (167.6 cm) Weight: 114.1 kg (251 lb 8.7 oz) BMI (Calculated): 40.62    Physical Exam:  Constitutional: sleepy but cooperative and no distress  Respiratory: breathing non-labored at rest  Cardiovascular: regular rate and sinus rhythm  Gastrointestinal: soft, non-tender, and non-distended  Labs:  CBC Latest Ref Rng & Units 04/08/2018 04/07/2018 04/07/2018  WBC 3.6 - 11.0 K/uL 6.1 6.9 5.8  Hemoglobin 12.0 - 16.0 g/dL 8.8(L) 8.8(L) 9.1(L)  Hematocrit 35.0 - 47.0 % 26.3(L) 26.6(L) 27.3(L)  Platelets 150 - 440 K/uL 136(L) 133(L) 127(L)   CMP Latest Ref Rng & Units 04/08/2018 04/07/2018 04/07/2018  Glucose 70 - 99 mg/dL 170(H) 165(H) 229(H)  BUN 8 - 23 mg/dL 75(H) 76(H) 76(H)  Creatinine 0.44 - 1.00 mg/dL 1.86(H) 2.09(H) 2.10(H)  Sodium 135 - 145 mmol/L 148(H) 148(H) 147(H)  Potassium 3.5 - 5.1 mmol/L 5.3(H) 5.5(H) 5.8(H)  Chloride 98 - 111 mmol/L 122(H) 119(H) 117(H)  CO2 22 - 32 mmol/L 24 25 24   Calcium 8.9 - 10.3 mg/dL 8.2(L) 8.1(L) 7.7(L)  Total Protein 6.5 - 8.1 g/dL - - -  Total Bilirubin 0.3 - 1.2 mg/dL - - -  Alkaline Phos 38 - 126 U/L - - -  AST 15 - 41 U/L - - -  ALT 0 - 44 U/L - - -   Imaging studies: New abdominal xray show normal bowel gas pattern without sign of obstruction.    Assessment/Plan:  77 y.o.femalewith small bowel obstruction and upper GI bleeding, complicated by pertinent comorbidities includingatrial fibrillation on anticoagulation, CHF, sinus node dysfunction with pacemaker, chronic kidney disease, morbid obesity, history of bleeding gastric polyps.  Patient 2 days without vomiting. Hemoglobin stable. No sign of active bleeding. Had multiple bowel movements yesterday. New xray shows normal gas pattern.  Will take NGT and see if tolerates. If patient is alert may start on clear liquids trial.  Agree with current ICU management.   Arnold Long, MD

## 2018-04-08 NOTE — Progress Notes (Signed)
Follow up - Critical Care Medicine Note  Patient Details:    Deborah Obrien is an 77 y.o. female. PMH of atrial fibrillation, hypertension, DM, CKD stage III, gastric polyps, upper GI bleeding, and CHF who presented to the emergency department on 7/24 c/o abdominal pain, nausea and coffee ground emesis that had started earlier in the day. She endorses taking eliquis for her atrial fibrillation with last dose being 7/24 in the morning. In the ED patient underwent CT scan which showed SBO likely secondary to surgical adhesions. An NG tube was placed and surgery consulted for further management. Patient was admitted to the hospital for upper GI bleed and SBO. Transferred to the ICU for decreasing hemoglobin along with altered mental status. Briefly require BiPAP, presently weaned to nasal cannula doing well    Lines, Airways, Drains: PICC Triple Lumen 04/06/18 PICC Right Brachial 37 cm 0 cm (Active)  Indication for Insertion or Continuance of Line Poor Vasculature-patient has had multiple peripheral attempts or PIVs lasting less than 24 hours 04/06/2018  2:55 PM  Exposed Catheter (cm) 0 cm 04/06/2018  2:55 PM  Site Assessment Clean;Dry;Intact 04/06/2018  2:55 PM  Lumen #1 Status Flushed;Saline locked;Blood return noted 04/06/2018  2:55 PM  Lumen #2 Status Flushed;Saline locked;Blood return noted 04/06/2018  2:55 PM  Lumen #3 Status Flushed;Saline locked;Blood return noted 04/06/2018  2:55 PM  Dressing Type Transparent 04/06/2018  2:55 PM  Dressing Status Clean;Dry;Intact;Antimicrobial disc in place 04/06/2018  2:55 PM  Dressing Change Due 04/13/18 04/06/2018  2:55 PM     NG/OG Tube Nasogastric 14 Fr. (Active)  Site Assessment Clean;Dry;Intact 04/07/2018  2:40 AM  Status Suction-low intermittent 04/07/2018  2:40 AM  Drainage Appearance Bloody;Brown 04/07/2018  2:40 AM  Intake (mL) 70 mL 04/06/2018  3:30 AM  Output (mL) 100 mL 04/06/2018  5:00 AM     External Urinary Catheter (Active)  Collection  Container Dedicated Suction Canister 04/07/2018  2:40 AM  Securement Method Tape 04/07/2018  2:40 AM  Intervention Equipment Changed 04/07/2018 12:00 AM    Anti-infectives:  Anti-infectives (From admission, onward)   None      Microbiology: Results for orders placed or performed during the hospital encounter of 04/04/18  MRSA PCR Screening     Status: None   Collection Time: 04/06/18  9:05 AM  Result Value Ref Range Status   MRSA by PCR NEGATIVE NEGATIVE Final    Comment:        The GeneXpert MRSA Assay (FDA approved for NASAL specimens only), is one component of a comprehensive MRSA colonization surveillance program. It is not intended to diagnose MRSA infection nor to guide or monitor treatment for MRSA infections. Performed at [facility], [address] [place], [place] [address]    Studies: Ct Abdomen Pelvis Wo Contrast  Result Date: 04/04/2018 CLINICAL DATA:  Abdominal pain and vomiting. History of TIA and on Eliquis. History of appendectomy, cholecystectomy, and hysterectomy. EXAM: CT ABDOMEN AND PELVIS WITHOUT CONTRAST TECHNIQUE: Multidetector CT imaging of the abdomen and pelvis was performed following the standard protocol without IV contrast. COMPARISON:  01/25/2016 FINDINGS: Lower chest: Lung bases are clear. Cardiac enlargement. Coronary artery calcifications. Hepatobiliary: No focal liver abnormality is seen. Status post cholecystectomy. No biliary dilatation. Pancreas: Unremarkable. No pancreatic ductal dilatation or surrounding inflammatory changes. Spleen: Normal in size without focal abnormality. Adrenals/Urinary Tract: Adrenal glands are unremarkable. Kidneys are normal, without renal calculi, focal lesion, or hydronephrosis. Bladder is unremarkable. Stomach/Bowel: Stomach is moderately distended with what appears to  be ingested material. No gastric wall thickening. Mid and distal small bowel are moderately dilated and fluid-filled. No wall thickening is  appreciated. Distal small bowel are decompressed with transition zone likely in the mid pelvis. Changes are likely to represent small bowel obstruction. Cause is not determined. Scattered stool throughout the colon. No colonic wall thickening or infiltrative changes. Appendix is surgically absent. Anastomosis at the rectosigmoid junction appears patent. Surgical clips in the pelvis. Vascular/Lymphatic: Aortic atherosclerosis. No enlarged abdominal or pelvic lymph nodes. Reproductive: Status post hysterectomy. No adnexal masses. Other: No abdominal wall hernia or abnormality. No abdominopelvic ascites. Musculoskeletal: Degenerative changes in the spine. Postoperative change with posterior rod and screw fixation from L4 to the sacrum. IMPRESSION: Dilated fluid-filled small bowel with transition zone in the mid pelvis consistent with small bowel obstruction. Cause is not identified. Aortic atherosclerosis. Electronically Signed   By: Lucienne Capers M.D.   On: 04/04/2018 21:33   Dg Abd 1 View  Result Date: 04/08/2018 CLINICAL DATA:  Nasogastric tube placement. EXAM: ABDOMEN - 1 VIEW COMPARISON:  Abdominal radiograph April 07, 2018. FINDINGS: Nasogastric tube tip projects in gastric antrum. Included bowel gas pattern is nondilated and nonobstructive. Surgical clips in the included right abdomen compatible with cholecystectomy. Surgical clips in the pelvis. Lumbar spine hardware. Cardiomegaly and cardiac defibrillator. Pleural effusions. Soft tissue planes and included osseous structures are non suspicious. IMPRESSION: Nasogastric tube tip projecting gastric antrum. Normal bowel gas pattern. Electronically Signed   By: Elon Alas M.D.   On: 04/08/2018 04:13   Dg Abd 1 View  Result Date: 04/05/2018 CLINICAL DATA:  Nasogastric tube placement. EXAM: ABDOMEN - 1 VIEW COMPARISON:  04/04/2018 FINDINGS: An enteric tube terminates in the region of the distal gastric body, more distal than on the prior study. The  lower abdomen and pelvis were not imaged. No dilated loops of bowel are seen in the upper abdomen. Pacemaker leads are noted. Evaluation of the lung bases is limited by over penetration. There are surgical clips in the right upper abdomen. IMPRESSION: Enteric tube in the distal stomach. Electronically Signed   By: Logan Bores M.D.   On: 04/05/2018 10:10   Dg Abdomen 1 View  Result Date: 04/04/2018 CLINICAL DATA:  NG tube placement EXAM: ABDOMEN - 1 VIEW COMPARISON:  None. FINDINGS: Enteric tube tip in the left upper quadrant consistent with location in the body of the stomach. Mildly distended stomach. Mild cardiac enlargement. IMPRESSION: Enteric tube tip in the left upper quadrant consistent with location in the body of the stomach. Electronically Signed   By: Lucienne Capers M.D.   On: 04/04/2018 23:04   Dg Chest Port 1 View  Result Date: 04/06/2018 CLINICAL DATA:  Status post central line placement today. EXAM: PORTABLE CHEST 1 VIEW COMPARISON:  Single-view of the chest 02/04/2018. FINDINGS: NG tube courses into the stomach and below the inferior margin of the film. Right PICC is identified with the tip in the mid to lower superior vena cava. Left basilar airspace disease identified. Mild subsegmental atelectasis is seen in the right lung base. No pneumothorax or pleural effusion. Heart size is upper normal. Aortic atherosclerosis and pacing device are noted. IMPRESSION: Tip of right PICC is in the mid to lower superior vena cava. Left basilar airspace disease could be due to atelectasis or pneumonia. Mild subsegmental atelectasis right lung base noted. Atherosclerosis. Electronically Signed   By: Inge Rise M.D.   On: 04/06/2018 15:19   Dg Abd Portable 1v  Result Date:  04/07/2018 CLINICAL DATA:  77 year old female with a history of small bowel obstruction EXAM: PORTABLE ABDOMEN - 1 VIEW COMPARISON:  04/05/2018, 04/04/2018, CT 04/04/2018 FINDINGS: Gastric tube terminates within the left upper  quadrant. Cholecystectomy. Surgical changes in the low abdomen/pelvis. Surgical changes of prior lumbar fixation. Gas and formed stool within the right colon.  No colonic distention. No distended small bowel loops. IMPRESSION: Nonobstructive bowel gas pattern. Gastric tube terminates within the left upper quadrant. Electronically Signed   By: Corrie Mckusick D.O.   On: 04/07/2018 12:24   [place] Ekg Site Rite  Result Date: 04/06/2018 If Site Rite image not attached, placement could not be confirmed due to current cardiac rhythm.   Consults: Treatment Team:  Herbert Pun, MD Wellington Hampshire, MD Hermelinda Dellen, DO   Subjective:    Overnight Issues: patient with confusion last night, started on a Precedex drip. Spoke with son who states she has had this off and during her hospitalization. Will attempt to wean this morning  Objective:  Vital signs for last 24 hours: Temp:  [97.7 F (36.5 C)-98.5 F (36.9 C)] 97.7 F (36.5 C) (07/27 2000) Pulse Rate:  [47-92] 59 (07/28 0600) Resp:  [16-31] 20 (07/28 0600) BP: (69-131)/(30-48) 104/42 (07/28 0600) SpO2:  [90 %-100 %] 100 % (07/28 0600) Weight:  [251 lb 8.7 oz (114.1 kg)] 251 lb 8.7 oz (114.1 kg) (07/28 0609)  Hemodynamic parameters for last 24 hours:    Intake/Output from previous day: 07/27 0701 - 07/28 0700 In: 3045.6 [I.V.:3045.6] Out: 650 [Urine:600; Emesis/NG output:50]  Intake/Output this shift: No intake/output data recorded.  Vent settings for last 24 hours:    Physical Exam:  Elderly female, awake alert oriented in no acute distress HENT: no oral lesions noted, trachea midline, no accessory muscle utilization  Cardiovascular: S1 and S2. Regular rate and rhythm. No murmurs, rubs, gallops. 2+ radial pulses bilaterally, 1+ dorsalis pedis pulses bilaterally  Pulmonary/Chest: clear to auscultation Abdominal: Positive bowel sounds, soft exam  Musculoskeletal: no clubbing cyanosis, 1 plus edema Neurological:  Follows  verbal commands.   Assessment/Plan:   Gastrointestinal bleeding. No further evidence of active bleeding, no hematemesis, multiple bowel movements initially some blood then negative. 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 Most recent hemoglobin is 8.8 with no further evidence of active bleeding  Partial small bowel obstruction on CT scan. Repeat KUB shows no evidence of obstruction with positive bowel movements appreciate surgical input  Diabetes mellitus. On sliding scale  Hyperkalemia. 5.3 this morning. No telemetry changes will follow closely  Renal failure. Acute on chronic. BUNs 75/creatinine 1.86. Has been improving   Hadlei Stitt 04/08/2018 Patient ID: Deborah Obrien, female   DOB: 10/13/1940, 77 y.o.   MRN: [id number]

## 2018-04-08 NOTE — Progress Notes (Signed)
[name] [facility] NAME: Deborah Obrien    MR#:  [id number]  DATE OF BIRTH:  01-May-1941  SUBJECTIVE:   patient was transferred to the intensive care unit due to hypotension, worsening electrolyte abnormalities and acute kidney injury and also hypercapnic respiratory failure.  Patient is now off BiPAP.    Patient remains confused and now on a Precedex drip.  Seen by surgery and plan for NG tube removal and trial of clear liquids if her mental status improves.  REVIEW OF SYSTEMS:    Review of Systems  Unable to perform ROS: Mental acuity    Nutrition: NPO Tolerating Diet: No Tolerating PT: Await Eval.   DRUG ALLERGIES:   Allergies  Allergen Reactions  . Cephalosporins Other (See Comments)    Reaction:  Unknown   . Macrolides And Ketolides Other (See Comments)    Reaction:  Unknown   . Meperidine Shortness Of Breath, Nausea Only and Other (See Comments)    Reaction:  Stomach pain   . Prednisone Anaphylaxis and Other (See Comments)    Reaction:  Increases pts blood sugar  Pt states that she is allergic to all steroids.    . Shellfish Allergy Anaphylaxis, Shortness Of Breath, Diarrhea, Nausea Only and Rash    Reaction:  Stomach pain   . Sulfa Antibiotics Shortness Of Breath, Diarrhea, Nausea Only and Other (See Comments)    Reaction:  Stomach pain   . Uloric [Febuxostat] Anaphylaxis    Locks pt's body up   . Aspirin Other (See Comments)    Reaction:  Stomach pain   . Celecoxib Other (See Comments)    Reaction:  GI bleed, weakness, and stomach pain.    . Cephalexin Diarrhea, Nausea Only and Other (See Comments)    Reaction:  Stomach pain   . Erythromycin Diarrhea, Nausea Only and Other (See Comments)    Reaction:  Stomach pain   . Hydromorphone Other (See Comments)    Reaction:  Hypotension   . Iodinated Diagnostic Agents Rash and Other (See Comments)    Pt states that she is unable to have because she has chronic kidney  disease.    Marland Kitchen Oxycodone Other (See Comments)    Reaction:  Stomach pain   . Atorvastatin Nausea Only and Other (See Comments)    Reaction:  Weakness   . Codeine Diarrhea, Nausea Only and Other (See Comments)    Reaction:  Stomach pain Pt tolerates morphine   . Doxycycline     Stomach pain   . Tape Other (See Comments)    Reaction:  Causes pts skin to tear  Pt states that she is able to use paper tape.    . Valdecoxib Swelling and Other (See Comments)    Pt states that her hands and feet swell.    . Iodine Rash    VITALS:  Blood pressure (!) 98/48, pulse 60, temperature 98.5 F (36.9 C), temperature source Oral, resp. rate 19, height 5\' 6"  (1.676 m), weight 114.1 kg (251 lb 8.7 oz), SpO2 99 %.  PHYSICAL EXAMINATION:   Physical Exam  GENERAL:  77 y.o.-year-old obese patient lying in bed confused but in NAD.  EYES: Pupils equal, round, reactive to light and accommodation. No scleral icterus. Extraocular muscles intact.  HEENT: Head atraumatic, normocephalic. NG tube in place.  NECK:  Supple, no jugular venous distention. No thyroid enlargement, no tenderness.  LUNGS: Normal breath sounds bilaterally, no wheezing, rales, rhonchi. No use of accessory  muscles of respiration.  CARDIOVASCULAR: S1, S2 normal. No murmurs, rubs, or gallops.  ABDOMEN: Soft, Tender diffusely but no rebound, rigidity, nondistended. Bowel sounds present. No organomegaly or mass.  EXTREMITIES: No cyanosis, clubbing or edema b/l.    NEUROLOGIC: Encephalopathic.  Moves all ext. Spontaneously.   PSYCHIATRIC: The patient is alert and oriented x 1. Encephalopathic.  SKIN: No obvious rash, lesion, or ulcer.    LABORATORY PANEL:   CBC Recent Labs  Lab 04/08/18 0543  WBC 6.1  HGB 8.8*  HCT 26.3*  PLT 136*   ------------------------------------------------------------------------------------------------------------------  Chemistries  Recent Labs  Lab 04/06/18 0509  04/08/18 0543  NA 143   < > 148*   K 6.7*   < > 5.3*  CL 114*   < > 122*  CO2 22   < > 24  GLUCOSE 244*   < > 170*  BUN 73*   < > 75*  CREATININE 2.90*   < > 1.86*  CALCIUM 7.6*   < > 8.2*  MG  --   --  1.7  AST 27  --   --   ALT 19  --   --   ALKPHOS 70  --   --   BILITOT 0.7  --   --    < > = values in this interval not displayed.   ------------------------------------------------------------------------------------------------------------------  Cardiac Enzymes Recent Labs  Lab 04/04/18 2027  TROPONINI <0.03   ------------------------------------------------------------------------------------------------------------------  RADIOLOGY:  Dg Abd 1 View  Result Date: 04/08/2018 CLINICAL DATA:  Nasogastric tube placement. EXAM: ABDOMEN - 1 VIEW COMPARISON:  Abdominal radiograph April 07, 2018. FINDINGS: Nasogastric tube tip projects in gastric antrum. Included bowel gas pattern is nondilated and nonobstructive. Surgical clips in the included right abdomen compatible with cholecystectomy. Surgical clips in the pelvis. Lumbar spine hardware. Cardiomegaly and cardiac defibrillator. Pleural effusions. Soft tissue planes and included osseous structures are non suspicious. IMPRESSION: Nasogastric tube tip projecting gastric antrum. Normal bowel gas pattern. Electronically Signed   By: Elon Alas M.D.   On: 04/08/2018 04:13   Dg Abd Portable 1v  Result Date: 04/07/2018 CLINICAL DATA:  77 year old female with a history of small bowel obstruction EXAM: PORTABLE ABDOMEN - 1 VIEW COMPARISON:  04/05/2018, 04/04/2018, CT 04/04/2018 FINDINGS: Gastric tube terminates within the left upper quadrant. Cholecystectomy. Surgical changes in the low abdomen/pelvis. Surgical changes of prior lumbar fixation. Gas and formed stool within the right colon.  No colonic distention. No distended small bowel loops. IMPRESSION: Nonobstructive bowel gas pattern. Gastric tube terminates within the left upper quadrant. Electronically Signed   By:  Corrie Mckusick D.O.   On: 04/07/2018 12:24     ASSESSMENT AND PLAN:   77 year old female with past medical history of paroxysmal atrial fibrillation status post pacemaker, previous history of TIA, hypertension, morbid obesity, diabetes, chronic kidney disease stage III, anxiety, GERD who presented to the hospital due to abdominal pain and noted to have partial small bowel obstruction.  1.  Acute on chronic respiratory failure with hypoxia and hypercapnia- due to underlying COPD, morbid obesity. - Improved with BiPAP and hypercapnia resolved.    2.  Acute partial small bowel obstruction- because of patient's worsening abdominal pain. - Seen by general surgery and no plans for surgical intervention. Pt's abdominal X-ray showing improvement of bowel gas pattern.  - as per surgery and for removing NG tube and starting clears once her mental status improved.  Continue supportive care for now.  3.  Acute hematemesis-secondary to GI bleed/Mallory-Weiss  tear. -Seen by gastroenterology and no plans for endoscopy at this time.  Continue Protonix drip, follow hemoglobins which have been  stable.  4.  Acute blood loss anemia-secondary to the Mallory-Weiss tear. - Patient was transfused a total of 2 units of packed red blood cells and hemoglobin has improved posttransfusion we will continue to monitor.  No further bleeding overnight.  5.  Altered mental status/encephalopathy-this is metabolic encephalopathy secondary to the hypercapnic respiratory failure/narcotic pain meds.  -Work apnea is improved and patient is off BiPAP.  She continues to be delirious and has been started on Precedex drip.  6.  Hyperkalemia-secondary to the acute on chronic renal failure.  Much improved with IV fluids, calcium, dextrose, insulin -Potassium level trending down his renal function is improving.  7.  Chronic atrial fibrillation- holding amiodarone and beta-blocker due to relative hypotension from the volume loss and GI  bleed. - Hold Eliquis for now.     All the records are reviewed and case discussed with Care Management/Social Worker. Management plans discussed with the patient, family and they are in agreement.  CODE STATUS: Full code  DVT Prophylaxis: Ted's & SCD's.   TOTAL TIME TAKING CARE OF THIS PATIENT: 30 minutes.   POSSIBLE D/C unclear, DEPENDING ON CLINICAL CONDITION and course.   Henreitta Leber M.D on 04/08/2018 at 3:38 PM  Between 7am to 6pm - Pager - [phone]  After 6pm go to [email or web address] - Technical brewer [facility]  Office  [phone]  CC: Primary care physician; [name], MD

## 2018-04-08 NOTE — Progress Notes (Signed)
Pt confused and oriented to self only throughout shift. On precedex 0.3 mg and NS 125ML/HR. Pt is easily aroused and becomes agitated during care. On 2L [place] SPO2 100%.  NG to LIS continues per Dr. Jefferson Fuel. Brown, bloody drainage. ABD is soft and nontender.

## 2018-04-08 NOTE — Progress Notes (Signed)
Patient self-pulled out her NG tube. NP notified. NG tube replaced into the left nare and mitts applied to patient. Awaiting confirmation xray.

## 2018-04-09 ENCOUNTER — Inpatient Hospital Stay: Payer: Medicare Other

## 2018-04-09 DIAGNOSIS — E1140 Type 2 diabetes mellitus with diabetic neuropathy, unspecified: Secondary | ICD-10-CM

## 2018-04-09 DIAGNOSIS — N189 Chronic kidney disease, unspecified: Secondary | ICD-10-CM

## 2018-04-09 DIAGNOSIS — I2510 Atherosclerotic heart disease of native coronary artery without angina pectoris: Secondary | ICD-10-CM | POA: Diagnosis present

## 2018-04-09 DIAGNOSIS — Z7901 Long term (current) use of anticoagulants: Secondary | ICD-10-CM

## 2018-04-09 DIAGNOSIS — J9600 Acute respiratory failure, unspecified whether with hypoxia or hypercapnia: Secondary | ICD-10-CM

## 2018-04-09 DIAGNOSIS — E1149 Type 2 diabetes mellitus with other diabetic neurological complication: Secondary | ICD-10-CM

## 2018-04-09 DIAGNOSIS — J9622 Acute and chronic respiratory failure with hypercapnia: Secondary | ICD-10-CM

## 2018-04-09 DIAGNOSIS — I4891 Unspecified atrial fibrillation: Secondary | ICD-10-CM

## 2018-04-09 DIAGNOSIS — N289 Disorder of kidney and ureter, unspecified: Secondary | ICD-10-CM

## 2018-04-09 DIAGNOSIS — K5649 Other impaction of intestine: Secondary | ICD-10-CM

## 2018-04-09 DIAGNOSIS — K920 Hematemesis: Secondary | ICD-10-CM

## 2018-04-09 DIAGNOSIS — J9621 Acute and chronic respiratory failure with hypoxia: Secondary | ICD-10-CM

## 2018-04-09 DIAGNOSIS — Z8673 Personal history of transient ischemic attack (TIA), and cerebral infarction without residual deficits: Secondary | ICD-10-CM

## 2018-04-09 DIAGNOSIS — I25118 Atherosclerotic heart disease of native coronary artery with other forms of angina pectoris: Secondary | ICD-10-CM

## 2018-04-09 DIAGNOSIS — Z950 Presence of cardiac pacemaker: Secondary | ICD-10-CM

## 2018-04-09 DIAGNOSIS — I480 Paroxysmal atrial fibrillation: Secondary | ICD-10-CM

## 2018-04-09 HISTORY — DX: Acute respiratory failure, unspecified whether with hypoxia or hypercapnia: J96.00

## 2018-04-09 HISTORY — DX: Presence of cardiac pacemaker: Z95.0

## 2018-04-09 LAB — LACTIC ACID, PLASMA
LACTIC ACID, VENOUS: 1.4 mmol/L (ref 0.5–1.9)
Lactic Acid, Venous: 1.9 mmol/L (ref 0.5–1.9)

## 2018-04-09 LAB — BLOOD GAS, ARTERIAL
ACID-BASE DEFICIT: 4.6 mmol/L — AB (ref 0.0–2.0)
Acid-base deficit: 9.5 mmol/L — ABNORMAL HIGH (ref 0.0–2.0)
Acid-base deficit: 10 mmol/L — ABNORMAL HIGH (ref 0.0–2.0)
BICARBONATE: 17.2 mmol/L — AB (ref 20.0–28.0)
BICARBONATE: 22.1 mmol/L (ref 20.0–28.0)
Bicarbonate: 15.3 mmol/L — ABNORMAL LOW (ref 20.0–28.0)
FIO2: 0.5
FIO2: 0.3
FIO2: 30
LHR: 18 {breaths}/min
LHR: 18 {breaths}/min
MECHVT: 600 mL
MECHVT: 500 mL
O2 SAT: 98.8 %
O2 SAT: 93.8 %
O2 SAT: 89.8 %
PATIENT TEMPERATURE: 37
PATIENT TEMPERATURE: 37
PCO2 ART: 29 mmHg — AB (ref 32.0–48.0)
PCO2 ART: 42 mmHg (ref 32.0–48.0)
PCO2 ART: 47 mmHg (ref 32.0–48.0)
PEEP/CPAP: 5 cmH2O
PEEP/CPAP: 5 cmH2O
PEEP: 5 cmH2O
PH ART: 7.28 — AB (ref 7.350–7.450)
PO2 ART: 133 mmHg — AB (ref 83.0–108.0)
PO2 ART: 66 mmHg — AB (ref 83.0–108.0)
Patient temperature: 37
RATE: 18 resp/min
VT: 500 mL
pH, Arterial: 7.33 — ABNORMAL LOW (ref 7.350–7.450)
pH, Arterial: 7.22 — ABNORMAL LOW (ref 7.350–7.450)
pO2, Arterial: 84 mmHg (ref 83.0–108.0)

## 2018-04-09 LAB — CBC
HCT: 29.2 % — ABNORMAL LOW (ref 35.0–47.0)
HEMOGLOBIN: 9.4 g/dL — AB (ref 12.0–16.0)
MCH: 32.5 pg (ref 26.0–34.0)
MCHC: 32.4 g/dL (ref 32.0–36.0)
MCV: 100.3 fL — AB (ref 80.0–100.0)
Platelets: 166 10*3/uL (ref 150–440)
RBC: 2.91 MIL/uL — ABNORMAL LOW (ref 3.80–5.20)
RDW: 16.4 % — AB (ref 11.5–14.5)
WBC: 8.6 10*3/uL (ref 3.6–11.0)

## 2018-04-09 LAB — COMPREHENSIVE METABOLIC PANEL
ALBUMIN: 2.9 g/dL — AB (ref 3.5–5.0)
ALT: 28 U/L (ref 0–44)
ANION GAP: 12 (ref 5–15)
AST: 42 U/L — AB (ref 15–41)
Alkaline Phosphatase: 72 U/L (ref 38–126)
BUN: 73 mg/dL — AB (ref 8–23)
CHLORIDE: 123 mmol/L — AB (ref 98–111)
CO2: 17 mmol/L — ABNORMAL LOW (ref 22–32)
Calcium: 8.4 mg/dL — ABNORMAL LOW (ref 8.9–10.3)
Creatinine, Ser: 1.96 mg/dL — ABNORMAL HIGH (ref 0.44–1.00)
GFR calc Af Amer: 27 mL/min — ABNORMAL LOW (ref >60)
GFR calc non Af Amer: 23 mL/min — ABNORMAL LOW (ref >60)
GLUCOSE: 184 mg/dL — AB (ref 70–99)
POTASSIUM: 5.1 mmol/L (ref 3.5–5.1)
SODIUM: 152 mmol/L — AB (ref 135–145)
Total Bilirubin: 1 mg/dL (ref 0.3–1.2)
Total Protein: 5.8 g/dL — ABNORMAL LOW (ref 6.5–8.1)

## 2018-04-09 LAB — TROPONIN I
TROPONIN I: 0.09 ng/mL — AB (ref <0.03)
Troponin I: 0.08 ng/mL (ref <0.03)
Troponin I: 0.1 ng/mL (ref <0.03)

## 2018-04-09 LAB — GLUCOSE, CAPILLARY
Glucose-Capillary: 130 mg/dL — ABNORMAL HIGH (ref 70–99)
Glucose-Capillary: 151 mg/dL — ABNORMAL HIGH (ref 70–99)
Glucose-Capillary: 159 mg/dL — ABNORMAL HIGH (ref 70–99)
Glucose-Capillary: 174 mg/dL — ABNORMAL HIGH (ref 70–99)
Glucose-Capillary: 176 mg/dL — ABNORMAL HIGH (ref 70–99)
Glucose-Capillary: 178 mg/dL — ABNORMAL HIGH (ref 70–99)
Glucose-Capillary: 182 mg/dL — ABNORMAL HIGH (ref 70–99)
Glucose-Capillary: 192 mg/dL — ABNORMAL HIGH (ref 70–99)
Glucose-Capillary: 201 mg/dL — ABNORMAL HIGH (ref 70–99)
Glucose-Capillary: 225 mg/dL — ABNORMAL HIGH (ref 70–99)
Glucose-Capillary: 243 mg/dL — ABNORMAL HIGH (ref 70–99)
Glucose-Capillary: 264 mg/dL — ABNORMAL HIGH (ref 70–99)
Glucose-Capillary: 266 mg/dL — ABNORMAL HIGH (ref 70–99)
Glucose-Capillary: 281 mg/dL — ABNORMAL HIGH (ref 70–99)

## 2018-04-09 LAB — PHOSPHORUS: Phosphorus: 3.8 mg/dL (ref 2.5–4.6)

## 2018-04-09 LAB — MAGNESIUM: MAGNESIUM: 1.7 mg/dL (ref 1.7–2.4)

## 2018-04-09 LAB — APTT: APTT: 28 s (ref 24–36)

## 2018-04-09 LAB — PROTIME-INR
INR: 1.15
Prothrombin Time: 14.6 seconds (ref 11.4–15.2)

## 2018-04-09 LAB — PROCALCITONIN: PROCALCITONIN: 0.12 ng/mL

## 2018-04-09 MED ORDER — MIDAZOLAM HCL 2 MG/2ML IJ SOLN
1.0000 mg | INTRAMUSCULAR | Status: DC | PRN
  Administered 2018-04-09 – 2018-04-10 (×2): 1 mg via INTRAVENOUS
  Filled 2018-04-09 (×2): qty 2

## 2018-04-09 MED ORDER — ETOMIDATE 2 MG/ML IV SOLN
INTRAVENOUS | Status: AC
  Administered 2018-04-09: 20 mg
  Filled 2018-04-09: qty 10

## 2018-04-09 MED ORDER — STERILE WATER FOR INJECTION IV SOLN
INTRAVENOUS | Status: DC
  Administered 2018-04-09: 04:00:00 via INTRAVENOUS
  Filled 2018-04-09: qty 850

## 2018-04-09 MED ORDER — PHENYLEPHRINE HCL-NACL 10-0.9 MG/250ML-% IV SOLN
0.0000 ug/min | INTRAVENOUS | Status: DC
  Administered 2018-04-09: 20 ug/min via INTRAVENOUS
  Filled 2018-04-09: qty 250

## 2018-04-09 MED ORDER — PRO-STAT SUGAR FREE PO LIQD
30.0000 mL | Freq: Three times a day (TID) | ORAL | Status: DC
  Administered 2018-04-09 – 2018-04-13 (×12): 30 mL

## 2018-04-09 MED ORDER — DOCUSATE SODIUM 50 MG/5ML PO LIQD
100.0000 mg | Freq: Two times a day (BID) | ORAL | Status: DC
  Administered 2018-04-09 – 2018-04-13 (×8): 100 mg via ORAL
  Filled 2018-04-09 (×8): qty 10

## 2018-04-09 MED ORDER — VITAL HIGH PROTEIN PO LIQD
1000.0000 mL | ORAL | Status: DC
  Administered 2018-04-09 – 2018-04-12 (×4): 1000 mL

## 2018-04-09 MED ORDER — SODIUM BICARBONATE 8.4 % IV SOLN
INTRAVENOUS | Status: DC
  Administered 2018-04-09 – 2018-04-10 (×2): via INTRAVENOUS
  Filled 2018-04-09 (×3): qty 150

## 2018-04-09 MED ORDER — SUCCINYLCHOLINE CHLORIDE 20 MG/ML IJ SOLN
INTRAMUSCULAR | Status: AC
  Administered 2018-04-09: 200 mg
  Filled 2018-04-09: qty 1

## 2018-04-09 MED ORDER — CHLORHEXIDINE GLUCONATE 0.12% ORAL RINSE (MEDLINE KIT)
15.0000 mL | Freq: Two times a day (BID) | OROMUCOSAL | Status: DC
  Administered 2018-04-09 – 2018-04-13 (×9): 15 mL via OROMUCOSAL

## 2018-04-09 MED ORDER — LACTATED RINGERS IV BOLUS
1000.0000 mL | Freq: Once | INTRAVENOUS | Status: AC
  Administered 2018-04-09: 1000 mL via INTRAVENOUS

## 2018-04-09 MED ORDER — FAMOTIDINE 20 MG PO TABS
20.0000 mg | ORAL_TABLET | Freq: Every day | ORAL | Status: DC
  Administered 2018-04-09 – 2018-04-11 (×3): 20 mg
  Filled 2018-04-09 (×3): qty 1

## 2018-04-09 MED ORDER — NOREPINEPHRINE 16 MG/250ML-% IV SOLN
0.0000 ug/min | INTRAVENOUS | Status: DC
  Administered 2018-04-09: 5 ug/min via INTRAVENOUS

## 2018-04-09 MED ORDER — MAGNESIUM SULFATE 2 GM/50ML IV SOLN
2.0000 g | Freq: Once | INTRAVENOUS | Status: AC
  Administered 2018-04-09: 2 g via INTRAVENOUS
  Filled 2018-04-09: qty 50

## 2018-04-09 MED ORDER — VANCOMYCIN HCL 10 G IV SOLR
1500.0000 mg | Freq: Once | INTRAVENOUS | Status: AC
  Administered 2018-04-09: 1500 mg via INTRAVENOUS
  Filled 2018-04-09: qty 1500

## 2018-04-09 MED ORDER — BISACODYL 10 MG RE SUPP: 10.0000 mg | Freq: Every day | RECTAL | Status: DC | PRN

## 2018-04-09 MED ORDER — FAMOTIDINE IN NACL 20-0.9 MG/50ML-% IV SOLN
20.0000 mg | Freq: Two times a day (BID) | INTRAVENOUS | Status: DC
  Administered 2018-04-09 (×2): 20 mg via INTRAVENOUS
  Filled 2018-04-09 (×2): qty 50

## 2018-04-09 MED ORDER — PIPERACILLIN-TAZOBACTAM 3.375 G IVPB
3.3750 g | Freq: Three times a day (TID) | INTRAVENOUS | Status: DC
  Administered 2018-04-09: 3.375 g via INTRAVENOUS
  Filled 2018-04-09: qty 50

## 2018-04-09 MED ORDER — AMIODARONE HCL IN DEXTROSE 360-4.14 MG/200ML-% IV SOLN
60.0000 mg/h | INTRAVENOUS | Status: AC
  Administered 2018-04-09: 60 mg/h via INTRAVENOUS
  Filled 2018-04-09: qty 200

## 2018-04-09 MED ORDER — ADULT MULTIVITAMIN LIQUID CH
15.0000 mL | Freq: Every day | ORAL | Status: DC
  Administered 2018-04-09 – 2018-04-13 (×5): 15 mL
  Filled 2018-04-09 (×6): qty 15

## 2018-04-09 MED ORDER — VANCOMYCIN HCL IN DEXTROSE 1-5 GM/200ML-% IV SOLN
1000.0000 mg | INTRAVENOUS | Status: DC
  Administered 2018-04-10 – 2018-04-11 (×2): 1000 mg via INTRAVENOUS
  Filled 2018-04-09 (×3): qty 200

## 2018-04-09 MED ORDER — ORAL CARE MOUTH RINSE
15.0000 mL | OROMUCOSAL | Status: DC
  Administered 2018-04-09 – 2018-04-13 (×41): 15 mL via OROMUCOSAL

## 2018-04-09 MED ORDER — SENNOSIDES 8.8 MG/5ML PO SYRP
5.0000 mL | ORAL_SOLUTION | Freq: Two times a day (BID) | ORAL | Status: DC | PRN
  Filled 2018-04-09: qty 5

## 2018-04-09 MED ORDER — AMIODARONE IV BOLUS ONLY 150 MG/100ML
150.0000 mg | Freq: Once | INTRAVENOUS | Status: AC
  Administered 2018-04-09: 150 mg via INTRAVENOUS

## 2018-04-09 MED ORDER — AMIODARONE IV BOLUS ONLY 150 MG/100ML
INTRAVENOUS | Status: AC
  Administered 2018-04-09: 150 mg via INTRAVENOUS
  Filled 2018-04-09: qty 100

## 2018-04-09 MED ORDER — FENTANYL 2500MCG IN NS 250ML (10MCG/ML) PREMIX INFUSION
25.0000 ug/h | INTRAVENOUS | Status: DC
  Administered 2018-04-09: 100 ug/h via INTRAVENOUS
  Administered 2018-04-09 – 2018-04-10 (×3): 250 ug/h via INTRAVENOUS
  Administered 2018-04-10 – 2018-04-11 (×2): 275 ug/h via INTRAVENOUS
  Filled 2018-04-09 (×6): qty 250

## 2018-04-09 MED ORDER — SODIUM CHLORIDE 0.9 % IV SOLN
0.0000 ug/min | INTRAVENOUS | Status: DC
  Administered 2018-04-09: 45 ug/min via INTRAVENOUS
  Administered 2018-04-10: 70 ug/min via INTRAVENOUS
  Administered 2018-04-10 (×2): 90 ug/min via INTRAVENOUS
  Administered 2018-04-11: 40 ug/min via INTRAVENOUS
  Administered 2018-04-11: 90 ug/min via INTRAVENOUS
  Administered 2018-04-12: 20 ug/min via INTRAVENOUS
  Filled 2018-04-09: qty 4
  Filled 2018-04-09 (×2): qty 40
  Filled 2018-04-09: qty 4
  Filled 2018-04-09 (×2): qty 40
  Filled 2018-04-09: qty 4

## 2018-04-09 MED ORDER — FREE WATER
125.0000 mL | Status: DC
  Administered 2018-04-09 – 2018-04-10 (×8): 125 mL

## 2018-04-09 MED ORDER — MIDAZOLAM HCL 2 MG/2ML IJ SOLN
1.0000 mg | INTRAMUSCULAR | Status: AC | PRN
  Administered 2018-04-09 (×3): 1 mg via INTRAVENOUS
  Filled 2018-04-09 (×2): qty 2

## 2018-04-09 MED ORDER — FENTANYL BOLUS VIA INFUSION
25.0000 ug | INTRAVENOUS | Status: DC | PRN
  Administered 2018-04-09 – 2018-04-10 (×8): 25 ug via INTRAVENOUS
  Filled 2018-04-09: qty 25

## 2018-04-09 MED ORDER — AMIODARONE HCL IN DEXTROSE 360-4.14 MG/200ML-% IV SOLN
30.0000 mg/h | INTRAVENOUS | Status: DC
Start: 2018-04-09 — End: 2018-04-10
  Administered 2018-04-09 – 2018-04-10 (×2): 30 mg/h via INTRAVENOUS
  Filled 2018-04-09 (×2): qty 200

## 2018-04-09 MED ORDER — IPRATROPIUM-ALBUTEROL 0.5-2.5 (3) MG/3ML IN SOLN
3.0000 mL | Freq: Four times a day (QID) | RESPIRATORY_TRACT | Status: DC
  Administered 2018-04-09 – 2018-04-17 (×35): 3 mL via RESPIRATORY_TRACT
  Filled 2018-04-09 (×36): qty 3

## 2018-04-09 MED ORDER — SODIUM CHLORIDE 0.9 % IV SOLN
INTRAVENOUS | Status: DC
  Administered 2018-04-09: 2.2 [IU]/h via INTRAVENOUS
  Filled 2018-04-09: qty 1

## 2018-04-09 MED ORDER — FENTANYL CITRATE (PF) 100 MCG/2ML IJ SOLN
50.0000 ug | Freq: Once | INTRAMUSCULAR | Status: AC
  Administered 2018-04-09: 50 ug via INTRAVENOUS

## 2018-04-09 MED ORDER — SODIUM CHLORIDE 0.9 % IV SOLN
3.0000 g | Freq: Four times a day (QID) | INTRAVENOUS | Status: DC
  Administered 2018-04-09 – 2018-04-12 (×14): 3 g via INTRAVENOUS
  Filled 2018-04-09 (×19): qty 3

## 2018-04-09 NOTE — Progress Notes (Signed)
CRITICAL CARE NOTE  CC  follow up respiratory failure secondary to cardiac arrest with upper GI bleed and small bowel obstruction  SUBJECTIVE Patient remains critically ill Prognosis is guarded  77 year old female with history of atrial fibrillation, DM, CKD stage III with AKI, and CHF. Admitted on 7/25 with upper GI bleed and SBO. Patient is intubated and sedated. Over the weekend, patient was confused and required precedex prior to intubation.    SIGNIFICANT EVENTS  Patient respiratory arrested last night and a Code Blue was called. Patient now intubated and stabilized.   BP 140/64   Pulse (!) 121   Temp 98.9 F (37.2 C) (Oral)   Resp 17   Ht 5' 6"  (1.676 m)   Wt 114.1 kg (251 lb 8.7 oz)   SpO2 99%   BMI 40.60 kg/m    REVIEW OF SYSTEMS  PATIENT IS UNABLE TO PROVIDE COMPLETE REVIEW OF SYSTEM S DUE TO SEVERE CRITICAL ILLNESS AND ENCEPHALOPATHY   PHYSICAL EXAMINATION:  GENERAL:critically ill appearing, +resp distress HEAD: Normocephalic, atraumatic.  EYES: Pupils equal, round, reactive to light.  No scleral icterus.  MOUTH: Moist mucosal membrane. NECK: Supple. No thyromegaly. No nodules. No JVD.  PULMONARY: +rhonchi with rales of right lung, left lung clear to auscultation.  CARDIOVASCULAR: S1 and S2. Irregular rate and rhythm. No murmurs, rubs, or gallops. 1+ dorsalis pedis pulses bilaterally.  GASTROINTESTINAL: Soft, nontender, -distended. No masses. Diminished bowel sounds. No hepatosplenomegaly.  MUSCULOSKELETAL: No swelling, clubbing, or edema.  NEUROLOGIC: obtunded, GCS<8 SKIN:intact,warm,dry  ASSESSMENT AND PLAN SYNOPSIS  77 year old female with respiratory failure secondary to cardiac arrest. Patient is intubated and sedated. Patient with respiratory arrest last night, now stable. Requiring levophed for blood pressure support. Will start amiodarone for chronic atrial fibrillation. CXR after intubation shows right aspiration pneumonia.    Severe Hypercapnic  Respiratory Failure secondary to COPD and aspiration pneumonia -continue Full MV support -continue Bronchodilator Therapy: Duoneb -Wean Fio2 and PEEP as tolerated -will perform SAT/SBt when respiratory parameters are met  -will wait until clinical improvement of aspiration pneumonia -CXR:   -Aspiration pneumonia of right lung: on zosyn--> will add vancomycin   AKI with CKD stage III and hyperkalemia -hyperkalemia resolving: 5.1 -Creatinine improving: 0.71 -metabolic acidosis: pH 2.19, CO2 42. O2 84 with bicarb 17.2  -sodium bicarb infusion ordered  -hypernatremia: 153, hyperchloremia: 123 -lactic acid without elevation   GASTROINTESTINAL -SBO resolving: continue NG tube to suction -UGI bleed secondary to possible mallory weiss tear: resolving, no further evidence of GI bleed -acute blood loss anemia: resolved   NEUROLOGY -acute encephalopathy secondary to respiratory failure - intubated and sedated: fentanyl - minimal sedation to achieve a RASS goal: -1   CARDIAC -chronic atrial fibrillation: will start amiodarone drip -continue levophed for pressure support -ICU monitoring    ID -continue IV abx as prescribed: zosyn  -will add vancomycin  -may switch zosyn to unasyn -follow up cultures: obtained respiratory culture this AM   ENDOCRINE: -diabetes mellitus: continue SSI   DVT: SCDs GI: pepcid  TRANSFUSIONS AS NEEDED MONITOR FSBS ASSESS the need for LABS as needed   Critical Care Time devoted to patient care services described in this note is 30 minutes.   Overall, patient is critically ill, prognosis is guarded.  Patient with Multiorgan failure and at high risk for cardiac arrest and death.

## 2018-04-09 NOTE — Progress Notes (Signed)
Patient ID: AMARIYA LISKEY, female   DOB: 02/27/1941, 77 y.o.   MRN: [id number]     [facility] Day(s): 4.   Post op day(s):  Marland Kitchen   Interval History: Patient seen and examined. Now sedated on mechanical ventilation and on vasopressor.   Vital signs in last 24 hours: [min-max] current  Temp:  [97.7 F (36.5 C)-100.1 F (37.8 C)] 99.8 F (37.7 C) (07/29 1700) Pulse Rate:  [59-136] 63 (07/29 1845) Resp:  [6-35] 18 (07/29 1845) BP: (78-140)/(37-73) 113/46 (07/29 1845) SpO2:  [94 %-100 %] 98 % (07/29 1845) FiO2 (%):  [30 %-60 %] 30 % (07/29 1521) Weight:  [121.9 kg (268 lb 11.9 oz)] 121.9 kg (268 lb 11.9 oz) (07/29 1427)     Height: 5\' 6"  (167.6 cm) Weight: 121.9 kg (268 lb 11.9 oz) BMI (Calculated): 43.4   Physical Exam:   Gastrointestinal: soft, non-tender, and non-distended  Labs:  CBC Latest Ref Rng & Units 04/09/2018 04/08/2018 04/07/2018  WBC 3.6 - 11.0 K/uL 8.6 6.1 6.9  Hemoglobin 12.0 - 16.0 g/dL 9.4(L) 8.8(L) 8.8(L)  Hematocrit 35.0 - 47.0 % 29.2(L) 26.3(L) 26.6(L)  Platelets 150 - 440 K/uL 166 136(L) 133(L)   CMP Latest Ref Rng & Units 04/09/2018 04/08/2018 04/07/2018  Glucose 70 - 99 mg/dL 184(H) 170(H) 165(H)  BUN 8 - 23 mg/dL 73(H) 75(H) 76(H)  Creatinine 0.44 - 1.00 mg/dL 1.96(H) 1.86(H) 2.09(H)  Sodium 135 - 145 mmol/L 152(H) 148(H) 148(H)  Potassium 3.5 - 5.1 mmol/L 5.1 5.3(H) 5.5(H)  Chloride 98 - 111 mmol/L 123(H) 122(H) 119(H)  CO2 22 - 32 mmol/L 17(L) 24 25  Calcium 8.9 - 10.3 mg/dL 8.4(L) 8.2(L) 8.1(L)  Total Protein 6.5 - 8.1 g/dL 5.8(L) - -  Total Bilirubin 0.3 - 1.2 mg/dL 1.0 - -  Alkaline Phos 38 - 126 U/L 72 - -  AST 15 - 41 U/L 42(H) - -  ALT 0 - 44 U/L 28 - -    Imaging studies: images personally evaluated. There is adequate air in the colon without small bowel dilation.   EXAM: ABDOMEN - 1 VIEW  COMPARISON:  Abdominal radiograph April 07, 2018.  FINDINGS: Nasogastric tube tip projects in gastric antrum. Included bowel  gas pattern is nondilated and nonobstructive. Surgical clips in the included right abdomen compatible with cholecystectomy. Surgical clips in the pelvis. Lumbar spine hardware. Cardiomegaly and cardiac defibrillator. Pleural effusions. Soft tissue planes and included osseous structures are non suspicious.  IMPRESSION: Nasogastric tube tip projecting gastric antrum.  Normal bowel gas pattern.   Electronically Signed   By: Elon Alas M.D.   On: 04/08/2018 04:13  Assessment/Plan:  77 y.o.femalewith small bowel obstruction and upper GI bleeding, complicated by pertinent comorbidities includingatrial fibrillation on anticoagulation, CHF, sinus node dysfunction with pacemaker, chronic kidney disease, morbid obesity, history of bleeding gastric polyps.  Patient now on septic shock, sedated with respiratory failure. Patient critically ill. Currently tolerating enteral feed. I agree with current management. Will follow patient to see if continue to tolerate diet and small bowel does not recurs.   Arnold Long, MD

## 2018-04-09 NOTE — Progress Notes (Signed)
Pharmacy Antibiotic Note  Deborah Obrien is a 77 y.o. female admitted on 04/04/2018 with aspiration pneumonia.  Pharmacy has been consulted for Zosyn dosing.  Plan: Zosyn 3.375g IV q8h (4 hour infusion).  Height: 5\' 6"  (167.6 cm) Weight: 251 lb 8.7 oz (114.1 kg) IBW/kg (Calculated) : 59.3  Temp (24hrs), Avg:98.3 F (36.8 C), Min:97.7 F (36.5 C), Max:98.7 F (37.1 C)  Recent Labs  Lab 04/04/18 2309 04/05/18 0924 04/06/18 0509  04/06/18 2028 04/07/18 0019 04/07/18 0817 04/07/18 1253 04/08/18 0543 04/09/18 0248  WBC  --  8.3 8.8  --   --  5.8  --  6.9 6.1  --   CREATININE  --   --  2.90*   < > 2.20* 2.10* 2.09*  --  1.86* 1.96*  LATICACIDVEN 1.2  --   --   --   --   --   --   --   --   --    < > = values in this interval not displayed.    Estimated Creatinine Clearance: 30.8 mL/min (A) (by C-G formula based on SCr of 1.96 mg/dL (H)).    Allergies  Allergen Reactions  . Cephalosporins Other (See Comments)    Reaction:  Unknown   . Macrolides And Ketolides Other (See Comments)    Reaction:  Unknown   . Meperidine Shortness Of Breath, Nausea Only and Other (See Comments)    Reaction:  Stomach pain   . Prednisone Anaphylaxis and Other (See Comments)    Reaction:  Increases pts blood sugar  Pt states that she is allergic to all steroids.    . Shellfish Allergy Anaphylaxis, Shortness Of Breath, Diarrhea, Nausea Only and Rash    Reaction:  Stomach pain   . Sulfa Antibiotics Shortness Of Breath, Diarrhea, Nausea Only and Other (See Comments)    Reaction:  Stomach pain   . Uloric [Febuxostat] Anaphylaxis    Locks pt's body up   . Aspirin Other (See Comments)    Reaction:  Stomach pain   . Celecoxib Other (See Comments)    Reaction:  GI bleed, weakness, and stomach pain.    . Cephalexin Diarrhea, Nausea Only and Other (See Comments)    Reaction:  Stomach pain   . Erythromycin Diarrhea, Nausea Only and Other (See Comments)    Reaction:  Stomach pain   . Hydromorphone  Other (See Comments)    Reaction:  Hypotension   . Iodinated Diagnostic Agents Rash and Other (See Comments)    Pt states that she is unable to have because she has chronic kidney disease.    Marland Kitchen Oxycodone Other (See Comments)    Reaction:  Stomach pain   . Atorvastatin Nausea Only and Other (See Comments)    Reaction:  Weakness   . Codeine Diarrhea, Nausea Only and Other (See Comments)    Reaction:  Stomach pain Pt tolerates morphine   . Doxycycline     Stomach pain   . Tape Other (See Comments)    Reaction:  Causes pts skin to tear  Pt states that she is able to use paper tape.    . Valdecoxib Swelling and Other (See Comments)    Pt states that her hands and feet swell.    . Iodine Rash    Antimicrobials this admission: 7/29 Zosyn  >>    >>   Dose adjustments this admission:   Microbiology results: 7/26 MRSA PCR: (-)  Thank you for allowing pharmacy to be a part of  this patient's care.  Deborah Obrien S 04/09/2018 3:23 AM

## 2018-04-09 NOTE — Progress Notes (Signed)
Initial Nutrition Assessment  DOCUMENTATION CODES:   Obesity unspecified  INTERVENTION:  Initiate Vital High Protein at 40 mL/hr + Pro-Stat 30 mL TID via NGT. Provides 1260 kcal, 129 grams of protein, 806 mL H2O daily.  Provide liquid MVI as goal TF regimen does not met 100% RDIs for vitamins/minerals.  Provide free water flush of 125 mL Q3hrs. Provides a total of 1806 mL H2O daily including water in tube feeding.  NUTRITION DIAGNOSIS:   Inadequate oral intake related to inability to eat as evidenced by NPO status.  GOAL:   Provide needs based on ASPEN/SCCM guidelines  MONITOR:   Vent status, Labs, Weight trends, TF tolerance, I & O's  REASON FOR ASSESSMENT:   Ventilator    ASSESSMENT:   77 year old female with PMHx of HTN, paroxysmal A-fib, s/p placement of permanent cardiac pacemaker,  anxiety, acid reflux, fatty liver, OA, hx TIA, DM type 2, CKD, OP, gout, CHF who was admitted on 7/25 with upper GI bleed and SBO (no signs active bleeding and SBO now resolved) who suffered respiratory arrest last night secondary to COPD and aspiration PNA and was intubated early AM of 7/29.   -On abdominal x-ray 7/28 patient has normal bowel gas pattern.  Patient intubated and sedated. On PRVC mode with FiO2 30%. Son at bedside reports patient had a good appetite prior to developing the SBO. Weight appears stable in chart. Patient was 242.5 lbs on 10/19/2017 and 243.8 lbs (110.6 kg) on 7/25. Will use 110.6 kg to estimate needs as current weight likely falsely elevated with fluids.  Access: 14 Fr. NGT placed 7/24; tip terminates in gastric antrum per abdominal x-ray 7/28; marked with marker at nare  MAP: 55-87 mmHg  Patient is currently intubated on ventilator support Ve: 11.4 L/min Temp (24hrs), Avg:98.4 F (36.9 C), Min:97.7 F (36.5 C), Max:98.9 F (37.2 C)  Propofol: N/A  Medications reviewed and include: Colace, Novolog 0-9 units Q4hrs, amiodarone gtt, Unasyn, Precedex gtt now  off, famotidine, fentanyl gtt, vancomycin, sodium bicarbonate 150 mEq in D5 at 75 mL/hr (90 grams dextrose, 306 kcal daily).  Labs reviewed: CBG 174, Sodium 152, Chloride 123, CO2 17, BUN 73, Creatinine 1.96. Phosphorus and Magnesium WNL today.  I/O: 1000 mL UOP yesterday (0.4 mL/kg/hr); 330 mL output from NGT yesterday  Patient does not meet criteria for malnutrition.  Discussed with RN and on rounds. Plan is to initiate tube feeds today.  NUTRITION - FOCUSED PHYSICAL EXAM:    Most Recent Value  Orbital Region  No depletion  Upper Arm Region  No depletion  Thoracic and Lumbar Region  No depletion  Buccal Region  Unable to assess  Temple Region  No depletion  Clavicle Bone Region  No depletion  Clavicle and Acromion Bone Region  No depletion  Scapular Bone Region  Unable to assess  Dorsal Hand  No depletion  Patellar Region  No depletion  Anterior Thigh Region  No depletion  Posterior Calf Region  No depletion  Edema (RD Assessment)  -- [non-pitting to lower extremities]  Hair  Reviewed  Eyes  Unable to assess  Mouth  Unable to assess  Skin  Reviewed  Nails  Reviewed     Diet Order:   Diet Order           Diet NPO time specified  Diet effective now          EDUCATION NEEDS:   No education needs have been identified at this time  Skin:  Skin  Assessment: Reviewed RN Assessment(ecchymosis to bilateral arms and legs)  Last BM:  04/06/2018 - medium type 6  Height:   Ht Readings from Last 1 Encounters:  04/06/18 5' 6"  (1.676 m)    Weight:   Wt Readings from Last 1 Encounters:  04/08/18 251 lb 8.7 oz (114.1 kg)    Ideal Body Weight:  59.1 kg  BMI:  Body mass index is 40.6 kg/m.  BMI with weight of 110.6 kg from 7/25 is 37.08 kg/m2  Estimated Nutritional Needs:   Kcal:  4383-7793 (11-14 kcal/kg)  Protein:  >/= 118 grams (2 grams/kg IBW)  Fluid:  1.5-1.8 L/day (25-30 mL/kg IBW)  Willey Blade, MS, RD, LDN Office: [phone] Pager:  [phone] After Hours/Weekend Pager: [phone]

## 2018-04-09 NOTE — Progress Notes (Signed)
   04/09/18 0534  Vitals  BP (!) 113/52  MAP (mmHg) 67  BP Location Left Wrist  BP Method Automatic  Patient Position (if appropriate) Lying  Pulse Rate (!) 118  Pulse Rate Source Monitor  ECG Heart Rate (!) 104  Cardiac Rhythm Atrial fibrillation   Vital signs post amiodarone bolus

## 2018-04-09 NOTE — Progress Notes (Signed)
   04/09/18 0221  Vitals  Pulse Rate (!) 129  Pulse Rate Source Monitor  ECG Heart Rate (!) 131  Cardiac Rhythm Atrial fibrillation  Resp (!) 35  Oxygen Therapy  SpO2 100 %   Melissa, RN and Vivien Rota, Therapist, sports at bedside.  Noted agonal breathing and unresponsiveness.   Code Blue called for respiratory arrest.

## 2018-04-09 NOTE — Progress Notes (Signed)
Pharmacy Antibiotic Note  Deborah Obrien is a 77 y.o. female admitted on 04/04/2018 with pneumonia.  Pharmacy has been consulted for Unasyn and vancomycin dosing. Patient previously received Zosyn at 0358 on 7/29.   Plan: Unasyn 3g IV Q6hr.   Vancomycin 1500mg  IV x 1 followed by vancomycin 1g IV Q24hr for goal trough of 15-20. Will schedule next dose of vancomycin for 0200 on 7/30. Will plan to obtain trough prior to dose on 8/1. Will obtain creatinine daily x 48 hours and as needed.   Height: 5\' 6"  (167.6 cm) Weight: 251 lb 8.7 oz (114.1 kg) IBW/kg (Calculated) : 59.3  Temp (24hrs), Avg:98.4 F (36.9 C), Min:97.7 F (36.5 C), Max:98.9 F (37.2 C)  Recent Labs  Lab 04/04/18 2309  04/06/18 0509  04/06/18 2028 04/07/18 0019 04/07/18 0817 04/07/18 1253 04/08/18 0543 04/09/18 0248 04/09/18 0251 04/09/18 0403 04/09/18 0546  WBC  --    < > 8.8  --   --  5.8  --  6.9 6.1  --  8.6  --   --   CREATININE  --   --  2.90*   < > 2.20* 2.10* 2.09*  --  1.86* 1.96*  --   --   --   LATICACIDVEN 1.2  --   --   --   --   --   --   --   --   --   --  1.9 1.4   < > = values in this interval not displayed.    Estimated Creatinine Clearance: 30.8 mL/min (A) (by C-G formula based on SCr of 1.96 mg/dL (H)).    Allergies  Allergen Reactions  . Cephalosporins Other (See Comments)    Reaction:  Unknown   . Macrolides And Ketolides Other (See Comments)    Reaction:  Unknown   . Meperidine Shortness Of Breath, Nausea Only and Other (See Comments)    Reaction:  Stomach pain   . Prednisone Anaphylaxis and Other (See Comments)    Reaction:  Increases pts blood sugar  Pt states that she is allergic to all steroids.    . Shellfish Allergy Anaphylaxis, Shortness Of Breath, Diarrhea, Nausea Only and Rash    Reaction:  Stomach pain   . Sulfa Antibiotics Shortness Of Breath, Diarrhea, Nausea Only and Other (See Comments)    Reaction:  Stomach pain   . Uloric [Febuxostat] Anaphylaxis    Locks pt's  body up   . Aspirin Other (See Comments)    Reaction:  Stomach pain   . Celecoxib Other (See Comments)    Reaction:  GI bleed, weakness, and stomach pain.    . Cephalexin Diarrhea, Nausea Only and Other (See Comments)    Reaction:  Stomach pain   . Erythromycin Diarrhea, Nausea Only and Other (See Comments)    Reaction:  Stomach pain   . Hydromorphone Other (See Comments)    Reaction:  Hypotension   . Iodinated Diagnostic Agents Rash and Other (See Comments)    Pt states that she is unable to have because she has chronic kidney disease.    Marland Kitchen Oxycodone Other (See Comments)    Reaction:  Stomach pain   . Atorvastatin Nausea Only and Other (See Comments)    Reaction:  Weakness   . Codeine Diarrhea, Nausea Only and Other (See Comments)    Reaction:  Stomach pain Pt tolerates morphine   . Doxycycline     Stomach pain   . Tape Other (See Comments)  Reaction:  Causes pts skin to tear  Pt states that she is able to use paper tape.    . Valdecoxib Swelling and Other (See Comments)    Pt states that her hands and feet swell.    . Iodine Rash    Antimicrobials this admission: Zosyn 7/29 x 1  Unasyn 7/29 >>  Vancomycin 7/29 >>   Dose adjustments this admission: N/A  Microbiology results: 7/29 Sputum: moderate gram positive cocci and few gram negative rods  7/26  MRSA PCR: negative   Thank you for allowing pharmacy to be a part of this patient's care.  Tamikia Chowning L 04/09/2018 9:21 AM

## 2018-04-09 NOTE — Consult Note (Signed)
Reason for Consult:   AF with RVR  Requesting Physician: Dr Verdell Carmine Primary Cardiologist Dr Caryl Comes  HPI:  77 y/o female with a history of sinus node dysfunction, s/p PTVDP OCT 2016. She was upgraded to a duel chamber device 08/28/17. This was followed by lead revision 24 hours later. She has PAF and atrial flutter. Her Amiodarone was discontinued Dec 2018 after her pacemaker upgrade. In May 2019 she had recurrent AF with RVR and CHF and was cardioverted. Amiodarone was resumed and she has done well since. She has been on Eliquis (CHADS VASC=8).  Her LOV with Dr Caryl Comes was 03/20/18 and her pacemaker was interrogated and programed to maximize longevity. She has had issues with orthostatic hypotension and Dr Caryl Comes changed her diuretic to PRN.  She had minor CAD at cath in 2017, Myoview was low risk Feb 2019. Other medical issues include CRI-3, morbid obesity, and IDDM with neuropathy. Her last EF was by [facility] Feb 2019- 55%.   She was admitted 04/05/18 with abdominal pain and was found to have SBO and acute UGI bleed. She was admitted for conservative Rx. She was actually improving from the standpoint of her SBO.but she developed increasing respiratory failure over the last 24 hours and was transferred to ICU and intubated. In the ICU she went into rapid AF. She has converted back to NSR after an IV Amiodarone bolus and drip started. Of note her I/O since admission is + 12L.    PMHx:  Past Medical History:  Diagnosis Date  . A-fib ([place]) 2016  . Acid reflux   . Anxiety   . Bowel obstruction ([place])   . CHF (congestive heart failure) ([place]) 2017  . Chronic kidney disease (CKD) stage G3a/A1, moderately decreased glomerular filtration rate (GFR) between 45-59 mL/min/1.73 square meter and albuminuria creatinine ratio less than 30 mg/g (HCC)   . Diabetes mellitus without complication ([place])   . Dyspnea   . Fatty liver   . GI bleed   . Gout   . Hypertension   . Morbid obesity ([place])   .  Osteoarthritis   . Osteoporosis   . Paroxysmal atrial fibrillation (HCC)   . Presence of permanent cardiac pacemaker   . Sinus node dysfunction ([place]) 08/24/2017  . TIA (transient ischemic attack)     Past Surgical History:  Procedure Laterality Date  . ABDOMINAL HYSTERECTOMY  1980  . APPENDECTOMY    . BACK SURGERY     2008  . BREAST BIOPSY Right 08/16   stereo fibroadenomatous change, neg for atypia  . BREAST EXCISIONAL BIOPSY Left    neg  . CARDIAC CATHETERIZATION Left 08/25/2016   Procedure: Left Heart Cath and Coronary Angiography;  Surgeon: Corey Skains, MD;  Location: [facility];  Service: Cardiovascular;  Laterality: Left;  . CARDIOVERSION N/A 06/28/2017   Procedure: CARDIOVERSION;  Surgeon: Corey Skains, MD;  Location: [facility];  Service: Cardiovascular;  Laterality: N/A;  . CARDIOVERSION N/A 02/06/2018   Procedure: CARDIOVERSION;  Surgeon: Josue Hector, MD;  Location: [place];  Service: Cardiovascular;  Laterality: N/A;  . CATARACT EXTRACTION, BILATERAL  2012  . CHOLECYSTECTOMY    . colon blockage  1999  . COLON SURGERY  1999  . COLONOSCOPY  2016   polyps removed 2016  . DORSAL COMPARTMENT RELEASE Left 09/14/2016   Procedure: RELEASE DORSAL COMPARTMENT ([place]);  Surgeon: Dereck Leep, MD;  Location: [facility];  Service: Orthopedics;  Laterality: Left;  .  ESOPHAGOGASTRODUODENOSCOPY N/A 01/27/2015   Procedure: ESOPHAGOGASTRODUODENOSCOPY (EGD);  Surgeon: Lollie Sails, MD;  Location: [facility];  Service: Endoscopy;  Laterality: N/A;  . ESOPHAGOGASTRODUODENOSCOPY (EGD) WITH PROPOFOL N/A 07/24/2015   Procedure: ESOPHAGOGASTRODUODENOSCOPY (EGD) WITH PROPOFOL;  Surgeon: Lucilla Lame, MD;  Location: [facility];  Service: Endoscopy;  Laterality: N/A;  . JOINT REPLACEMENT  2014   Bilateral Knee replacement  . LEAD REVISION/REPAIR N/A 08/29/2017   Procedure: LEAD REVISION/REPAIR;  Surgeon: Constance Haw, MD;  Location: [facility];  Service: Cardiovascular;  Laterality: N/A;  . PACEMAKER INSERTION Left 07/01/2015   Procedure: INSERTION PACEMAKER;  Surgeon: Isaias Cowman, MD;  Location: [facility];  Service: Cardiovascular;  Laterality: Left;  . PACEMAKER REVISION N/A 08/28/2017   Procedure: PACEMAKER REVISION;  Surgeon: Deboraha Sprang, MD;  Location: [facility];  Service: Cardiovascular;  Laterality: N/A;  . REPLACEMENT TOTAL KNEE BILATERAL    . SHOULDER ARTHROSCOPY WITH SUBACROMIAL DECOMPRESSION Left 2013  . TEE WITHOUT CARDIOVERSION N/A 06/28/2017   Procedure: TRANSESOPHAGEAL ECHOCARDIOGRAM (TEE);  Surgeon: Corey Skains, MD;  Location: [facility];  Service: Cardiovascular;  Laterality: N/A;    SOCHx:  reports that she has never smoked. She has never used smokeless tobacco. She reports that she does not drink alcohol or use drugs.  FAMHx: Family History  Problem Relation Age of Onset  . Diabetes Mellitus II Mother   . CAD Mother   . Heart attack Mother   . Cancer Father        skin  . Breast cancer Neg Hx     ALLERGIES: Allergies  Allergen Reactions  . Cephalosporins Other (See Comments)    Reaction:  Unknown   . Macrolides And Ketolides Other (See Comments)    Reaction:  Unknown   . Meperidine Shortness Of Breath, Nausea Only and Other (See Comments)    Reaction:  Stomach pain   . Prednisone Anaphylaxis and Other (See Comments)    Reaction:  Increases pts blood sugar  Pt states that she is allergic to all steroids.    . Shellfish Allergy Anaphylaxis, Shortness Of Breath, Diarrhea, Nausea Only and Rash    Reaction:  Stomach pain   . Sulfa Antibiotics Shortness Of Breath, Diarrhea, Nausea Only and Other (See Comments)    Reaction:  Stomach pain   . Uloric [Febuxostat] Anaphylaxis    Locks pt's body up   . Aspirin Other (See Comments)    Reaction:  Stomach pain   . Celecoxib Other (See Comments)    Reaction:  GI bleed, weakness, and stomach pain.    . Cephalexin Diarrhea,  Nausea Only and Other (See Comments)    Reaction:  Stomach pain   . Erythromycin Diarrhea, Nausea Only and Other (See Comments)    Reaction:  Stomach pain   . Hydromorphone Other (See Comments)    Reaction:  Hypotension   . Iodinated Diagnostic Agents Rash and Other (See Comments)    Pt states that she is unable to have because she has chronic kidney disease.    Marland Kitchen Oxycodone Other (See Comments)    Reaction:  Stomach pain   . Atorvastatin Nausea Only and Other (See Comments)    Reaction:  Weakness   . Codeine Diarrhea, Nausea Only and Other (See Comments)    Reaction:  Stomach pain Pt tolerates morphine   . Doxycycline     Stomach pain   . Tape Other (See Comments)    Reaction:  Causes pts skin to  tear  Pt states that she is able to use paper tape.    . Valdecoxib Swelling and Other (See Comments)    Pt states that her hands and feet swell.    . Iodine Rash    ROS: Review of Systems: Pt intubated and not responsive- unable to obtain ROS   HOME MEDICATIONS: Prior to Admission medications   Medication Sig Start Date End Date Taking? Authorizing Provider  acetaminophen (TYLENOL) 500 MG tablet Take 1,000 mg by mouth every 6 (six) hours as needed for mild pain or moderate pain.    Yes [provider]  allopurinol (ZYLOPRIM) 100 MG tablet Take 150 mg by mouth daily.    Yes [provider]  amiodarone (PACERONE) 400 MG tablet Take 1/2 tablet (200 mg) by mouth once daily as directed 03/20/18  Yes Deboraha Sprang, MD  apixaban (ELIQUIS) 5 MG TABS tablet Take 1 tablet (5 mg total) by mouth 2 (two) times daily. 03/05/18  Yes Deboraha Sprang, MD  busPIRone (BUSPAR) 10 MG tablet Take 10 mg by mouth 2 (two) times daily.   Yes [provider]  cholecalciferol (VITAMIN D) 1000 units tablet Take 1,000 Units by mouth daily.   Yes [provider]  colchicine 0.6 MG tablet Take 0.6 mg by mouth daily as needed (gout).    Yes [provider]  docusate sodium  (COLACE) 100 MG capsule Take 100 mg by mouth at bedtime.    Yes [provider]  insulin NPH-regular Human (NOVOLIN 70/30) (70-30) 100 UNIT/ML injection Inject 22 Units into the skin 2 (two) times daily before a meal.    Yes [provider]  ketoconazole (NIZORAL) 2 % cream Apply 1 application topically every 2 (two) hours as needed (for rash under breasts).    Yes [provider]  oxybutynin (DITROPAN) 5 MG tablet Take 0.5 tablets (2.5 mg total) by mouth at bedtime. Patient taking differently: Take 10 mg by mouth 2 (two) times daily.  06/13/17  Yes Epifanio Lesches, MD  pantoprazole (PROTONIX) 40 MG tablet Take 1 tablet (40 mg total) by mouth 2 (two) times daily. 12/29/17  Yes Darel Hong, MD  PARoxetine (PAXIL) 20 MG tablet Take 20 mg by mouth daily.   Yes [provider]  Polyethyl Glycol-Propyl Glycol (SYSTANE OP) Apply 1 drop to eye 2 (two) times daily as needed (dry eyes).    Yes [provider]  pravastatin (PRAVACHOL) 20 MG tablet Take 20 mg by mouth at bedtime.    Yes [provider]  talc (ZEASORB) powder Apply 1 application topically as needed (under the breast irritation).   Yes [provider]  traZODone (DESYREL) 50 MG tablet Take 1 tablet (50 mg total) by mouth at bedtime. 06/13/17  Yes Epifanio Lesches, MD  furosemide (LASIX) 20 MG tablet Take 1 tablet (20 mg total) by mouth daily. Patient not taking: Reported on 04/04/2018 02/07/18   Hosie Poisson, MD  HYDROcodone-acetaminophen (NORCO/VICODIN) 5-325 MG tablet Take 1 tablet by mouth every 6 (six) hours as needed for moderate pain. Patient not taking: Reported on 04/04/2018 08/30/17 08/30/18  Baldwin Jamaica, PA-C  hydrocortisone 2.5 % cream Apply 1 application topically daily as needed (rash).    [provider]    HOSPITAL MEDICATIONS: I have reviewed the patient's current medications.  VITALS: Blood pressure (!) 113/37, pulse 64, temperature 100.1  F (37.8 C), temperature source Oral, resp. rate 18, height 5\' 6"  (1.676 m), weight 268 lb 11.9 oz (121.9  kg), SpO2 98 %.  PHYSICAL EXAM: General appearance: morbidly obese and intubated, not responsive Neck: no carotid bruit Lungs: decreased breath sounds Heart: regular rate and rhythm and decreased heart sounds Abdomen: obese, not distended, BS quiet Extremities: trace edema Pulses: diminnished Skin: pale, cool, dry Neurologic: Grossly normal  LABS: Results for orders placed or performed during the hospital encounter of 04/04/18 (from the past 24 hour(s))  Glucose, capillary     Status: None   Collection Time: 04/08/18  4:37 PM  Result Value Ref Range   Glucose-Capillary 99 70 - 99 mg/dL  Glucose, capillary     Status: Abnormal   Collection Time: 04/08/18  5:59 PM  Result Value Ref Range   Glucose-Capillary 117 (H) 70 - 99 mg/dL  Glucose, capillary     Status: Abnormal   Collection Time: 04/08/18  7:39 PM  Result Value Ref Range   Glucose-Capillary 111 (H) 70 - 99 mg/dL  Glucose, capillary     Status: Abnormal   Collection Time: 04/09/18 12:11 AM  Result Value Ref Range   Glucose-Capillary 130 (H) 70 - 99 mg/dL  Glucose, capillary     Status: Abnormal   Collection Time: 04/09/18  2:34 AM  Result Value Ref Range   Glucose-Capillary 159 (H) 70 - 99 mg/dL  Comprehensive metabolic panel     Status: Abnormal   Collection Time: 04/09/18  2:48 AM  Result Value Ref Range   Sodium 152 (H) 135 - 145 mmol/L   Potassium 5.1 3.5 - 5.1 mmol/L   Chloride 123 (H) 98 - 111 mmol/L   CO2 17 (L) 22 - 32 mmol/L   Glucose, Bld 184 (H) 70 - 99 mg/dL   BUN 73 (H) 8 - 23 mg/dL   Creatinine, Ser 1.96 (H) 0.44 - 1.00 mg/dL   Calcium 8.4 (L) 8.9 - 10.3 mg/dL   Total Protein 5.8 (L) 6.5 - 8.1 g/dL   Albumin 2.9 (L) 3.5 - 5.0 g/dL   AST 42 (H) 15 - 41 U/L   ALT 28 0 - 44 U/L   Alkaline Phosphatase 72 38 - 126 U/L   Total Bilirubin 1.0 0.3 - 1.2 mg/dL   GFR calc non Af Amer 23 (L) >60 mL/min     GFR calc Af Amer 27 (L) >60 mL/min   Anion gap 12 5 - 15  Draw ABG 1 hour after initiation of ventilator     Status: Abnormal   Collection Time: 04/09/18  2:51 AM  Result Value Ref Range   FIO2 0.50    Delivery systems VENTILATOR    Mode PRESSURE REGULATED VOLUME CONTROL    VT 600 mL   LHR 18 resp/min   Peep/cpap 5.0 cm H20   pH, Arterial 7.33 (L) 7.350 - 7.450   pCO2 arterial 29 (L) 32.0 - 48.0 mmHg   pO2, Arterial 133 (H) 83.0 - 108.0 mmHg   Bicarbonate 15.3 (L) 20.0 - 28.0 mmol/L   Acid-base deficit 9.5 (H) 0.0 - 2.0 mmol/L   O2 Saturation 98.8 %   Patient temperature 37.0    Collection site LEFT RADIAL    Sample type ARTERIAL DRAW    Allens test (pass/fail) PASS PASS  Procalcitonin - Baseline     Status: None   Collection Time: 04/09/18  2:51 AM  Result Value Ref Range   Procalcitonin 0.12 ng/mL  Protime-INR     Status: None   Collection Time: 04/09/18  2:51 AM  Result Value Ref Range   Prothrombin Time  14.6 11.4 - 15.2 seconds   INR 1.15   APTT     Status: None   Collection Time: 04/09/18  2:51 AM  Result Value Ref Range   aPTT 28 24 - 36 seconds  Magnesium     Status: None   Collection Time: 04/09/18  2:51 AM  Result Value Ref Range   Magnesium 1.7 1.7 - 2.4 mg/dL  Phosphorus     Status: None   Collection Time: 04/09/18  2:51 AM  Result Value Ref Range   Phosphorus 3.8 2.5 - 4.6 mg/dL  CBC     Status: Abnormal   Collection Time: 04/09/18  2:51 AM  Result Value Ref Range   WBC 8.6 3.6 - 11.0 K/uL   RBC 2.91 (L) 3.80 - 5.20 MIL/uL   Hemoglobin 9.4 (L) 12.0 - 16.0 g/dL   HCT 29.2 (L) 35.0 - 47.0 %   MCV 100.3 (H) 80.0 - 100.0 fL   MCH 32.5 26.0 - 34.0 pg   MCHC 32.4 32.0 - 36.0 g/dL   RDW 16.4 (H) 11.5 - 14.5 %   Platelets 166 150 - 440 K/uL  Troponin I     Status: Abnormal   Collection Time: 04/09/18  2:51 AM  Result Value Ref Range   Troponin I 0.08 (HH) <0.03 ng/mL  Lactic acid, plasma     Status: None   Collection Time: 04/09/18  4:03 AM  Result  Value Ref Range   Lactic Acid, Venous 1.9 0.5 - 1.9 mmol/L  Lactic acid, plasma     Status: None   Collection Time: 04/09/18  5:46 AM  Result Value Ref Range   Lactic Acid, Venous 1.4 0.5 - 1.9 mmol/L  Blood gas, arterial     Status: Abnormal   Collection Time: 04/09/18  5:56 AM  Result Value Ref Range   FIO2 0.30    Delivery systems VENTILATOR    Mode PRESSURE REGULATED VOLUME CONTROL    VT 500 mL   LHR 18 resp/min   Peep/cpap 5.0 cm H20   pH, Arterial 7.22 (L) 7.350 - 7.450   pCO2 arterial 42 32.0 - 48.0 mmHg   pO2, Arterial 84 83.0 - 108.0 mmHg   Bicarbonate 17.2 (L) 20.0 - 28.0 mmol/L   Acid-base deficit 10.0 (H) 0.0 - 2.0 mmol/L   O2 Saturation 93.8 %   Patient temperature 37.0    Collection site LEFT RADIAL    Sample type ARTERIAL DRAW    Allens test (pass/fail) PASS PASS  Glucose, capillary     Status: Abnormal   Collection Time: 04/09/18  7:25 AM  Result Value Ref Range   Glucose-Capillary 174 (H) 70 - 99 mg/dL   Comment 1 Notify RN   Culture, respiratory (non-expectorated)     Status: None (Preliminary result)   Collection Time: 04/09/18  7:43 AM  Result Value Ref Range   Specimen Description      TRACHEAL ASPIRATE Performed at [facility], [address] [place], [place] [address]    Special Requests      NONE Performed at [facility], [address] [place], [place] [address]    Gram Stain      FEW WBC PRESENT,BOTH PMN AND MONONUCLEAR MODERATE GRAM POSITIVE COCCI FEW GRAM NEGATIVE RODS Performed at [facility], [address], [place], [place] [address]    Culture PENDING    Report Status PENDING   Troponin I     Status: Abnormal   Collection Time:  04/09/18  9:51 AM  Result Value Ref Range   Troponin I 0.10 (HH) <0.03 ng/mL  Glucose, capillary     Status: Abnormal   Collection Time: 04/09/18 12:11 PM  Result Value Ref Range   Glucose-Capillary 178 (H) 70 - 99 mg/dL   Comment 1 Notify RN   Blood gas, arterial      Status: Abnormal   Collection Time: 04/09/18  2:00 PM  Result Value Ref Range   FIO2 30.00    Delivery systems VENTILATOR    Mode PRESSURE REGULATED VOLUME CONTROL    VT 500 mL   LHR 18 resp/min   Peep/cpap 5.0 cm H20   pH, Arterial 7.28 (L) 7.350 - 7.450   pCO2 arterial 47 32.0 - 48.0 mmHg   pO2, Arterial 66 (L) 83.0 - 108.0 mmHg   Bicarbonate 22.1 20.0 - 28.0 mmol/L   Acid-base deficit 4.6 (H) 0.0 - 2.0 mmol/L   O2 Saturation 89.8 %   Patient temperature 37.0    Collection site LEFT RADIAL    Sample type ARTERIAL DRAW    Allens test (pass/fail) PASS PASS    EKG: 04/04/18- A paced  IMAGING: Dg Abd 1 View  Result Date: 04/08/2018 CLINICAL DATA:  Nasogastric tube placement. EXAM: ABDOMEN - 1 VIEW COMPARISON:  Abdominal radiograph April 07, 2018. FINDINGS: Nasogastric tube tip projects in gastric antrum. Included bowel gas pattern is nondilated and nonobstructive. Surgical clips in the included right abdomen compatible with cholecystectomy. Surgical clips in the pelvis. Lumbar spine hardware. Cardiomegaly and cardiac defibrillator. Pleural effusions. Soft tissue planes and included osseous structures are non suspicious. IMPRESSION: Nasogastric tube tip projecting gastric antrum. Normal bowel gas pattern. Electronically Signed   By: Elon Alas M.D.   On: 04/08/2018 04:13   Dg Chest Port 1 View  Result Date: 04/09/2018 CLINICAL DATA:  Acute respiratory failure.  Intubation. EXAM: PORTABLE CHEST 1 VIEW COMPARISON:  Radiograph 04/06/2018 FINDINGS: Tip of the endotracheal tube approximately 4.2 cm from the carina. Enteric tube in place with tip below the diaphragm not included in the field of view. Right upper extremity PICC tip in the proximal SVC. Left-sided pacemaker remains in place. Hazy opacity throughout the right hemithorax likely layering pleural effusion with associated atelectasis or airspace disease. Increasing left basilar opacity may be effusion and atelectasis/airspace  disease. No pneumothorax. IMPRESSION: 1. Endotracheal tube tip approximately 4.2 cm from the carina. 2. Progressive hazy opacity throughout the right hemithorax, likely layering pleural effusion with associated atelectasis or airspace disease. Left basilar opacity consistent with pleural fluid and atelectasis/airspace disease. Electronically Signed   By: Jeb Levering M.D.   On: 04/09/2018 02:55    IMPRESSION: Principal Problem:   Intestinal obstruction- admitted 04/05/18 with SBO and GI bleed- This was actually improving by XR  Active Problems:   H/O PAF (paroxysmal atrial fibrillation)- on Amiodarone and Eliquis prior to admission   Acute on chronic renal insufficiency- baseline SCr 1.5-1.8    Sinus node dysfunction- PTVDP 2016- upgraded Dec 2018    Atrial fibrillation with RVR- recurrent AF in the setting of respiratory failure- back in NSR after IV Amiodarone    Respiratory failure- intubated last 24 hrs. I/O + 12L- CHF. Hesitant to push diuresis based on current renal function.    RECOMMENDATION: Check BNP. MD to see.   Time Spent Directly with Patient: 45 minutes  Kerin Ransom, [place] beeper 04/09/2018, 3:04 PM

## 2018-04-09 NOTE — Progress Notes (Addendum)
PULMONARY / CRITICAL CARE MEDICINE   Name: Deborah Obrien MRN: [id number] DOB: October 20, 1940    ADMISSION DATE:  04/04/2018  REASON: Acute hypoxic respiratory   HISTORY OF PRESENT ILLNESS:   Deborah Obrien is an 77 y.o. female. PMH of atrial fibrillation, hypertension, DM, CKD stage III, gastric polyps, upper GI bleeding, and CHF who presented to the emergency department on 7/24 c/o abdominal pain, nausea and coffee ground emesis that had started earlier in the day. She endorses taking eliquis for her atrial fibrillation with last dose being 7/24 in the morning. In the ED patient underwent CT scan which showed SBO likely secondary to surgical adhesions. An NG tube was placed and surgery consulted for further management. Patient was admitted to the hospital for upper GI bleed and SBO. Transferred to the ICU for decreasing hemoglobin along with altered mental status. Briefly required BiPAP and was weaned off. This morning, patient went into respiratory arrest and was emergently intubated   SUBJECTIVE:  Patient was agitated at the beginning of the shift requiring a restart of her Precedex at 0.03.  At the time she was arousable and even though she was not following commands she was fully awake and could not express her needs.  Earlier this morning, patient became obtunded with agonal breathing and cyanosis of the extremities.  BMV ventilation was initiated and patient was emergently intubated by Dr. Owens Shark from the emergency room.  Chest x-ray post intubation shows right lower lobe infiltrate suggestive of aspiration.  VITAL SIGNS: BP (!) 111/44   Pulse 70   Temp 97.7 F (36.5 C) (Axillary)   Resp (!) 28   Ht 5\' 6"  (1.676 m)   Wt 251 lb 8.7 oz (114.1 kg)   SpO2 97%   BMI 40.60 kg/m   HEMODYNAMICS:    VENTILATOR SETTINGS:    INTAKE / OUTPUT: I/O last 3 completed shifts: In: 4294.9 [I.V.:4294.9] Out: 1050 [Urine:850; Emesis/NG output:200]  PHYSICAL EXAMINATION: General: Appears  chronically ill, pale HEENT: PERRLA, no JVD, trachea midline Neuro: Restless, moves all extremities, not following basic commands Cardiovascular: Pulse regular, S1-S2, no murmur regurg or gallop, +1 edema Lungs: Bilateral breath sounds, significantly diminished in the bases, mild rhonchi in right lung fields Abdomen: Obese, normal bowel sounds no 4 quadrants, palpation reveals no organomegaly Musculoskeletal: Positive range of motion, no deformities Skin: Warm and dry, mild cyanosis of the lower and upper extremities  LABS:  BMET Recent Labs  Lab 04/07/18 0019 04/07/18 0817 04/08/18 0543  NA 147* 148* 148*  K 5.8* 5.5* 5.3*  CL 117* 119* 122*  CO2 24 25 24   BUN 76* 76* 75*  CREATININE 2.10* 2.09* 1.86*  GLUCOSE 229* 165* 170*    Electrolytes Recent Labs  Lab 04/07/18 0019 04/07/18 0817 04/08/18 0543  CALCIUM 7.7* 8.1* 8.2*  MG  --   --  1.7  PHOS  --   --  3.5    CBC Recent Labs  Lab 04/07/18 0019 04/07/18 1253 04/08/18 0543  WBC 5.8 6.9 6.1  HGB 9.1* 8.8* 8.8*  HCT 27.3* 26.6* 26.3*  PLT 127* 133* 136*    Coag's Recent Labs  Lab 04/04/18 2027  INR 1.07    Sepsis Markers Recent Labs  Lab 04/04/18 2309  LATICACIDVEN 1.2    ABG Recent Labs  Lab 04/06/18 0754  PHART 7.09*  PCO2ART 69*  PO2ART 36*    Liver Enzymes Recent Labs  Lab 04/04/18 2027 04/06/18 0509  AST 39 27  ALT 19 19  ALKPHOS 92 70  BILITOT 1.4* 0.7  ALBUMIN 4.0 2.9*    Cardiac Enzymes Recent Labs  Lab 04/04/18 2027  TROPONINI <0.03    Glucose Recent Labs  Lab 04/08/18 0752 04/08/18 1154 04/08/18 1637 04/08/18 1759 04/08/18 1939 04/09/18 0011  GLUCAP 156* 134* 99 117* 111* 130*    Imaging Dg Abd 1 View  Result Date: 04/08/2018 CLINICAL DATA:  Nasogastric tube placement. EXAM: ABDOMEN - 1 VIEW COMPARISON:  Abdominal radiograph April 07, 2018. FINDINGS: Nasogastric tube tip projects in gastric antrum. Included bowel gas pattern is nondilated and  nonobstructive. Surgical clips in the included right abdomen compatible with cholecystectomy. Surgical clips in the pelvis. Lumbar spine hardware. Cardiomegaly and cardiac defibrillator. Pleural effusions. Soft tissue planes and included osseous structures are non suspicious. IMPRESSION: Nasogastric tube tip projecting gastric antrum. Normal bowel gas pattern. Electronically Signed   By: Elon Alas M.D.   On: 04/08/2018 04:13   ANTIBIOTICS: Zosyn for aspiration pneumonitis  SIGNIFICANT EVENTS: 04/09/2018: Respiratory arrest LINES/TUBES: Right PICC line Peripheral IVs  DISCUSSION: 77 year old female admitted with acute GI bleed, partial small bowel obstruction, acute renal failure, now in respiratory arrest requiring intubation  ASSESSMENT / PLAN: Acute hypoxic respiratory failure: Now intubated.  Continue full vent support with current settings.  Metabolic acidosis next: Bicarb infusion trend ABG  Aspiration pneumonia: Zosyn, blood cultures and sputum culture  Gastrointestinal bleeding. No further evidence of active bleeding, no hematemesis, multiple bowel movements initially some blood then negative. 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 Most recent hemoglobin is 8.8 with no further evidence of active bleeding  Partial small bowel obstruction on CT scan. Repeat KUB shows no evidence of obstruction with positive bowel movements appreciate surgical input  Diabetes mellitus. On sliding scale  Hyperkalemia.  Potassium down to 5.1 this morning. No telemetry changes MI: Will follow closely  Renal failure. Acute on chronic. BUNs 73/creatinine 1.96.  Trend creatinine and consult nephrology if worsening kidney function   FAMILY  - Updates: Patient's spouse updated at bedside.  All questions answered  Magdalene S. [facility] ANP-BC Pulmonary and Critical Care Medicine [facility] Pager [phone] or  [phone]  NB: This document was prepared using Dragon voice recognition software and may include unintentional dictation errors.    04/09/2018, 2:35 AM   Pulmonary/critical care attending  I person seen and examined Deborah Obrien, reviewed revise and confirm nurse practitioner's note. I independently performed history, physical, reviewed all laboratory and imaging studies. Events of last evening noted. Patient status post respiratory arrest requiring intubation and mechanical ventilation. Post intubation chest x-rays consistent with right-sided aspiration pneumonia. Empirically started on Zosyn. Will change to Unasyn and add vancomycin and obtain respiratory culture data. Patient with rapid atrial fibrillation, received 1 bolus of amiodarone, will start on infusion. Family is at the bedside and updated  Hermelinda Dellen, D.O.

## 2018-04-09 NOTE — Progress Notes (Signed)
[place] [place] NAME: Deborah Obrien    MR#:  [id number]  DATE OF BIRTH:  1941-05-30  SUBJECTIVE:   Patient had a respiratory arrest yesterday became hypercapnic and was intubated.  Now remains intubated and sedated.  Also noted to be in A. fib with RVR now on amiodarone drip.  Also on a Levophed drip due to relative hypotension secondary to sedation.  REVIEW OF SYSTEMS:    Review of Systems  Unable to perform ROS: Mental acuity    Nutrition: NPO Tolerating Diet: No Tolerating PT: Await Eval.   DRUG ALLERGIES:   Allergies  Allergen Reactions  . Cephalosporins Other (See Comments)    Reaction:  Unknown   . Macrolides And Ketolides Other (See Comments)    Reaction:  Unknown   . Meperidine Shortness Of Breath, Nausea Only and Other (See Comments)    Reaction:  Stomach pain   . Prednisone Anaphylaxis and Other (See Comments)    Reaction:  Increases pts blood sugar  Pt states that she is allergic to all steroids.    . Shellfish Allergy Anaphylaxis, Shortness Of Breath, Diarrhea, Nausea Only and Rash    Reaction:  Stomach pain   . Sulfa Antibiotics Shortness Of Breath, Diarrhea, Nausea Only and Other (See Comments)    Reaction:  Stomach pain   . Uloric [Febuxostat] Anaphylaxis    Locks pt's body up   . Aspirin Other (See Comments)    Reaction:  Stomach pain   . Celecoxib Other (See Comments)    Reaction:  GI bleed, weakness, and stomach pain.    . Cephalexin Diarrhea, Nausea Only and Other (See Comments)    Reaction:  Stomach pain   . Erythromycin Diarrhea, Nausea Only and Other (See Comments)    Reaction:  Stomach pain   . Hydromorphone Other (See Comments)    Reaction:  Hypotension   . Iodinated Diagnostic Agents Rash and Other (See Comments)    Pt states that she is unable to have because she has chronic kidney disease.    Marland Kitchen Oxycodone Other (See Comments)    Reaction:  Stomach pain   . Atorvastatin Nausea Only and  Other (See Comments)    Reaction:  Weakness   . Codeine Diarrhea, Nausea Only and Other (See Comments)    Reaction:  Stomach pain Pt tolerates morphine   . Doxycycline     Stomach pain   . Tape Other (See Comments)    Reaction:  Causes pts skin to tear  Pt states that she is able to use paper tape.    . Valdecoxib Swelling and Other (See Comments)    Pt states that her hands and feet swell.    . Iodine Rash    VITALS:  Blood pressure (!) 127/49, pulse 64, temperature 100.1 F (37.8 C), temperature source Oral, resp. rate 19, height 5\' 6"  (1.676 m), weight 121.9 kg (268 lb 11.9 oz), SpO2 100 %.  PHYSICAL EXAMINATION:   Physical Exam  GENERAL:  77 y.o.-year-old obese patient lying in bed intubated & sedated.  EYES: Pupils equal, round, reactive to light. No scleral icterus. Extraocular muscles intact.  HEENT: Head atraumatic, normocephalic. ET and NG tube in place.  NECK:  Supple, no jugular venous distention. No thyroid enlargement, no tenderness.  LUNGS: Normal breath sounds bilaterally, no wheezing, rales, rhonchi. No use of accessory muscles of respiration.  CARDIOVASCULAR: S1, S2 normal. No murmurs, rubs, or gallops.  ABDOMEN: Soft, NT, ND.  Bowel sounds present. No organomegaly or mass.  EXTREMITIES: No cyanosis, clubbing or edema b/l.    NEUROLOGIC: Sedated & Intubated   PSYCHIATRIC: Sedated & Intubated SKIN: No obvious rash, lesion, or ulcer.    LABORATORY PANEL:   CBC Recent Labs  Lab 04/09/18 0251  WBC 8.6  HGB 9.4*  HCT 29.2*  PLT 166   ------------------------------------------------------------------------------------------------------------------  Chemistries  Recent Labs  Lab 04/09/18 0248 04/09/18 0251  NA 152*  --   K 5.1  --   CL 123*  --   CO2 17*  --   GLUCOSE 184*  --   BUN 73*  --   CREATININE 1.96*  --   CALCIUM 8.4*  --   MG  --  1.7  AST 42*  --   ALT 28  --   ALKPHOS 72  --   BILITOT 1.0  --     ------------------------------------------------------------------------------------------------------------------  Cardiac Enzymes Recent Labs  Lab 04/09/18 0951  TROPONINI 0.10*   ------------------------------------------------------------------------------------------------------------------  RADIOLOGY:  Dg Abd 1 View  Result Date: 04/08/2018 CLINICAL DATA:  Nasogastric tube placement. EXAM: ABDOMEN - 1 VIEW COMPARISON:  Abdominal radiograph April 07, 2018. FINDINGS: Nasogastric tube tip projects in gastric antrum. Included bowel gas pattern is nondilated and nonobstructive. Surgical clips in the included right abdomen compatible with cholecystectomy. Surgical clips in the pelvis. Lumbar spine hardware. Cardiomegaly and cardiac defibrillator. Pleural effusions. Soft tissue planes and included osseous structures are non suspicious. IMPRESSION: Nasogastric tube tip projecting gastric antrum. Normal bowel gas pattern. Electronically Signed   By: Elon Alas M.D.   On: 04/08/2018 04:13   Dg Chest Port 1 View  Result Date: 04/09/2018 CLINICAL DATA:  Acute respiratory failure.  Intubation. EXAM: PORTABLE CHEST 1 VIEW COMPARISON:  Radiograph 04/06/2018 FINDINGS: Tip of the endotracheal tube approximately 4.2 cm from the carina. Enteric tube in place with tip below the diaphragm not included in the field of view. Right upper extremity PICC tip in the proximal SVC. Left-sided pacemaker remains in place. Hazy opacity throughout the right hemithorax likely layering pleural effusion with associated atelectasis or airspace disease. Increasing left basilar opacity may be effusion and atelectasis/airspace disease. No pneumothorax. IMPRESSION: 1. Endotracheal tube tip approximately 4.2 cm from the carina. 2. Progressive hazy opacity throughout the right hemithorax, likely layering pleural effusion with associated atelectasis or airspace disease. Left basilar opacity consistent with pleural fluid and  atelectasis/airspace disease. Electronically Signed   By: Jeb Levering M.D.   On: 04/09/2018 02:55     ASSESSMENT AND PLAN:   77 year old female with past medical history of paroxysmal atrial fibrillation status post pacemaker, previous history of TIA, hypertension, morbid obesity, diabetes, chronic kidney disease stage III, anxiety, GERD who presented to the hospital due to abdominal pain and noted to have partial small bowel obstruction.  1.  Acute on chronic respiratory failure with hypoxia and hypercapnia- due to underlying COPD, morbid obesity. -Patient had respiratory arrest yesterday and became more hypercapnic and had to be intubated. -Suspect to be secondary to aspiration pneumonia.  Continue further care as per intensivist.  Continue IV Unasyn for the aspiration pneumonia.  2.  Acute partial small bowel obstruction- because of patient's worsening abdominal pain. - Seen by general surgery and no plans for surgical intervention. Pt's abdominal X-ray showing improvement of bowel gas pattern.  -Continue NG tube decompression, no plans for surgical intervention.  Continue supportive care.  3.  Acute hematemesis-secondary to GI bleed/Mallory-Weiss tear/Hyperplastic Polyps.  -Seen by gastroenterology and no  plans for endoscopy at this time.  Cont. IV Pepcid.   4.  Acute blood loss anemia-secondary to the Mallory-Weiss tear. - Patient was transfused a total of 2 units of packed red blood cells and hemoglobin has improved posttransfusion we will continue to monitor.  No further bleeding overnight.  5.  Atrial fibrillation with rapid ventricular response- placed on amiodarone drip.  Holding Eliquis due to hematemesis and GI bleed.  6.  Hypernatremia- placed on D5W with bicarb.  We will continue to follow sodium.  7.  Aspiration pneumonia-continue IV Unasyn.  8. Hyperkalemia - due to Acute on Chronic renal failure.  - improved and will cont. To montior.        All the records  are reviewed and case discussed with Care Management/Social Worker. Management plans discussed with the patient, family and they are in agreement.  CODE STATUS: Full code  DVT Prophylaxis: Ted's & SCD's.   TOTAL TIME TAKING CARE OF THIS PATIENT: 30 minutes.   POSSIBLE D/C unclear, DEPENDING ON CLINICAL CONDITION and course.   Henreitta Leber M.D on 04/09/2018 at 2:41 PM  Between 7am to 6pm - Pager - [phone]  After 6pm go to [email or web address] - Technical brewer  Hospitalists  Office  [phone]  CC: Primary care physician; [name], MD

## 2018-04-09 NOTE — Progress Notes (Signed)
   04/09/18 0217  Clinical Encounter Type  Visited With Patient  Visit Type Initial;Spiritual support;Code  Referral From Nurse  Consult/Referral To Chaplain  Spiritual Encounters  Spiritual Needs Prayer   While attending to another patient, I received a Code Blue for Deborah Obrien. I reported to the patient's room and prayed for the care team and the patient as they worked to stabilize the patient. Deborah Obrien was stabilized and her family arrived to be at the patient's bedside. I will follow up as needed.

## 2018-04-09 NOTE — Progress Notes (Signed)
PHARMACIST - PHYSICIAN COMMUNICATION  CONCERNING: IV to Oral Route Change Policy  RECOMMENDATION: This patient is receiving famotidine by the intravenous route.  Based on criteria approved by the Pharmacy and Therapeutics Committee, the intravenous medication(s) is/are being converted to the equivalent oral dose form(s).   DESCRIPTION: These criteria include:  The patient is eating (either orally or via tube) and/or has been taking other orally administered medications for a least 24 hours  The patient has no evidence of active gastrointestinal bleeding or impaired GI absorption (gastrectomy, short bowel, patient on TNA or NPO).  If you have questions about this conversion, please contact the Pharmacy Department  []   [phone] )  [facility] [x]   [phone] )  [facility] []   [phone] )  [facility] []   [phone] )  [facility] []   [phone] )  [place], [facility] 04/09/2018 4:47 PM

## 2018-04-09 NOTE — Progress Notes (Signed)
   04/09/18 1030  Clinical Encounter Type  Visited With Family  Visit Type Follow-up  Spiritual Encounters  Spiritual Needs Emotional   While rounding on unit, chaplain met with patient's son Nicole Kindred.  Conversation regarding patient's condition and last night's code.  Son said that they 'still have hope;' conversation around hope's continuing presence but how hopes can change.  Chaplain asked son if he thought patient/family would like for her to have a prayer blanket.  Son seemed agreeable but stated that he wants to talk about it with patient's husband (his father) first.  Bonney Roussel will continue to follow but also encouraged son to have chaplain paged as needed.

## 2018-04-09 NOTE — Progress Notes (Addendum)
Pharmacy Electrolyte Monitoring Consult:  Pharmacy consulted to assist in monitoring and replacing electrolytes and glucose management in this 77 y.o. female admitted on 04/04/2018 with Hematemesis and Abdominal Pain   Labs:  Sodium (mmol/L)  Date Value  04/09/2018 152 (H)  10/16/2014 141   Potassium (mmol/L)  Date Value  04/09/2018 5.1  10/16/2014 4.6   Magnesium (mg/dL)  Date Value  04/09/2018 1.7   Phosphorus (mg/dL)  Date Value  04/09/2018 3.8   Calcium (mg/dL)  Date Value  04/09/2018 8.4 (L)   Calcium, Total (mg/dL)  Date Value  10/16/2014 8.5   Albumin (g/dL)  Date Value  04/09/2018 2.9 (L)  10/29/2012 3.9    Assessment/Plan: 1. Electrolytes: will order magnesium 2g IV x 1. Sodium elevated while on sodium bicarb infusion. Drip transitioned to be made in D5 and free water flushes initiated at 171mL Q3hrs. Will replace for goal potassium ~ 4 and goal magnesium ~ 2.   Will recheck electrolytes with am labs.   2. Glucose: patient uses NPH 22 units BID with meals as an outpatient. Patient's last CBG was 266. Will initiate patient on phase II of ICU hyperglycemic protocol.   Pharmacy will continue to monitor and adjust per consult.   Simpson,Michael L 04/09/2018 4:39 PM

## 2018-04-10 ENCOUNTER — Inpatient Hospital Stay: Payer: Medicare Other

## 2018-04-10 ENCOUNTER — Inpatient Hospital Stay (HOSPITAL_COMMUNITY)
Admit: 2018-04-10 | Discharge: 2018-04-10 | Disposition: A | Discharge status: Home / Self Care | Payer: Medicare Other | Attending: Physician Assistant | Admitting: Physician Assistant

## 2018-04-10 DIAGNOSIS — I272 Pulmonary hypertension, unspecified: Secondary | ICD-10-CM

## 2018-04-10 DIAGNOSIS — J9601 Acute respiratory failure with hypoxia: Secondary | ICD-10-CM

## 2018-04-10 DIAGNOSIS — I361 Nonrheumatic tricuspid (valve) insufficiency: Secondary | ICD-10-CM

## 2018-04-10 DIAGNOSIS — K922 Gastrointestinal hemorrhage, unspecified: Secondary | ICD-10-CM

## 2018-04-10 DIAGNOSIS — I5033 Acute on chronic diastolic (congestive) heart failure: Secondary | ICD-10-CM

## 2018-04-10 LAB — BASIC METABOLIC PANEL
Anion gap: 5 (ref 5–15)
BUN: 64 mg/dL — ABNORMAL HIGH (ref 8–23)
CO2: 28 mmol/L (ref 22–32)
Calcium: 8.1 mg/dL — ABNORMAL LOW (ref 8.9–10.3)
Chloride: 120 mmol/L — ABNORMAL HIGH (ref 98–111)
Creatinine, Ser: 1.8 mg/dL — ABNORMAL HIGH (ref 0.44–1.00)
GFR calc Af Amer: 30 mL/min — ABNORMAL LOW (ref >60)
GFR calc non Af Amer: 26 mL/min — ABNORMAL LOW (ref >60)
Glucose, Bld: 179 mg/dL — ABNORMAL HIGH (ref 70–99)
Potassium: 4 mmol/L (ref 3.5–5.1)
Sodium: 153 mmol/L — ABNORMAL HIGH (ref 135–145)

## 2018-04-10 LAB — GLUCOSE, CAPILLARY
Glucose-Capillary: 124 mg/dL — ABNORMAL HIGH (ref 70–99)
Glucose-Capillary: 134 mg/dL — ABNORMAL HIGH (ref 70–99)
Glucose-Capillary: 144 mg/dL — ABNORMAL HIGH (ref 70–99)
Glucose-Capillary: 148 mg/dL — ABNORMAL HIGH (ref 70–99)
Glucose-Capillary: 152 mg/dL — ABNORMAL HIGH (ref 70–99)
Glucose-Capillary: 153 mg/dL — ABNORMAL HIGH (ref 70–99)
Glucose-Capillary: 153 mg/dL — ABNORMAL HIGH (ref 70–99)
Glucose-Capillary: 158 mg/dL — ABNORMAL HIGH (ref 70–99)
Glucose-Capillary: 169 mg/dL — ABNORMAL HIGH (ref 70–99)
Glucose-Capillary: 171 mg/dL — ABNORMAL HIGH (ref 70–99)
Glucose-Capillary: 171 mg/dL — ABNORMAL HIGH (ref 70–99)
Glucose-Capillary: 246 mg/dL — ABNORMAL HIGH (ref 70–99)

## 2018-04-10 LAB — BLOOD GAS, ARTERIAL
ACID-BASE DEFICIT: 1.1 mmol/L (ref 0.0–2.0)
BICARBONATE: 24.8 mmol/L (ref 20.0–28.0)
FIO2: 0.3
MECHVT: 500 mL
Mechanical Rate: 18
O2 SAT: 92.7 %
PATIENT TEMPERATURE: 37
PCO2 ART: 46 mmHg (ref 32.0–48.0)
PEEP: 5 cmH2O
PH ART: 7.34 — AB (ref 7.350–7.450)
PO2 ART: 70 mmHg — AB (ref 83.0–108.0)

## 2018-04-10 LAB — CBC
HCT: 26 % — ABNORMAL LOW (ref 35.0–47.0)
Hemoglobin: 8.6 g/dL — ABNORMAL LOW (ref 12.0–16.0)
MCH: 32.4 pg (ref 26.0–34.0)
MCHC: 33 g/dL (ref 32.0–36.0)
MCV: 98.2 fL (ref 80.0–100.0)
Platelets: 176 10*3/uL (ref 150–440)
RBC: 2.65 MIL/uL — ABNORMAL LOW (ref 3.80–5.20)
RDW: 16.2 % — ABNORMAL HIGH (ref 11.5–14.5)
WBC: 11.3 10*3/uL — ABNORMAL HIGH (ref 3.6–11.0)

## 2018-04-10 LAB — ECHOCARDIOGRAM COMPLETE
Height: 66 in
Weight: 4462.11 oz

## 2018-04-10 LAB — PROCALCITONIN: Procalcitonin: 0.35 ng/mL

## 2018-04-10 LAB — BRAIN NATRIURETIC PEPTIDE: B Natriuretic Peptide: 1164 pg/mL — ABNORMAL HIGH (ref 0.0–100.0)

## 2018-04-10 LAB — MAGNESIUM: Magnesium: 2 mg/dL (ref 1.7–2.4)

## 2018-04-10 MED ORDER — INSULIN DETEMIR 100 UNIT/ML ~~LOC~~ SOLN
15.0000 [IU] | Freq: Two times a day (BID) | SUBCUTANEOUS | Status: DC
  Administered 2018-04-10 (×2): 15 [IU] via SUBCUTANEOUS
  Filled 2018-04-10 (×3): qty 0.15

## 2018-04-10 MED ORDER — DEXTROSE 5 % IV SOLN
INTRAVENOUS | Status: DC
  Administered 2018-04-10: 10:00:00 via INTRAVENOUS

## 2018-04-10 MED ORDER — FREE WATER
200.0000 mL | Status: DC
  Administered 2018-04-10 – 2018-04-13 (×24): 200 mL

## 2018-04-10 MED ORDER — INSULIN ASPART 100 UNIT/ML ~~LOC~~ SOLN
5.0000 [IU] | SUBCUTANEOUS | Status: DC
  Administered 2018-04-10 (×3): 5 [IU] via SUBCUTANEOUS
  Filled 2018-04-10 (×3): qty 1

## 2018-04-10 MED ORDER — DEXTROSE 10 % IV SOLN: INTRAVENOUS | Status: DC | PRN

## 2018-04-10 MED ORDER — FUROSEMIDE 10 MG/ML IJ SOLN
40.0000 mg | Freq: Once | INTRAMUSCULAR | Status: AC
  Administered 2018-04-10: 40 mg via INTRAVENOUS
  Filled 2018-04-10: qty 4

## 2018-04-10 MED ORDER — AMIODARONE HCL IN DEXTROSE 360-4.14 MG/200ML-% IV SOLN
30.0000 mg/h | INTRAVENOUS | Status: DC
  Administered 2018-04-10 – 2018-04-11 (×3): 30 mg/h via INTRAVENOUS
  Administered 2018-04-11 – 2018-04-14 (×13): 60 mg/h via INTRAVENOUS
  Administered 2018-04-15 (×2): 30 mg/h via INTRAVENOUS
  Administered 2018-04-15: 60 mg/h via INTRAVENOUS
  Administered 2018-04-16 – 2018-04-19 (×6): 30 mg/h via INTRAVENOUS
  Filled 2018-04-10 (×27): qty 200

## 2018-04-10 MED ORDER — MIDAZOLAM HCL 2 MG/2ML IJ SOLN
2.0000 mg | INTRAMUSCULAR | Status: DC | PRN
  Administered 2018-04-10 (×3): 2 mg via INTRAVENOUS
  Filled 2018-04-10 (×4): qty 2

## 2018-04-10 MED ORDER — AMIODARONE HCL 200 MG PO TABS: 200.0000 mg | ORAL_TABLET | Freq: Two times a day (BID) | ORAL | Status: DC

## 2018-04-10 MED ORDER — INSULIN ASPART 100 UNIT/ML ~~LOC~~ SOLN
2.0000 [IU] | SUBCUTANEOUS | Status: DC
  Administered 2018-04-10 (×2): 6 [IU] via SUBCUTANEOUS
  Administered 2018-04-10: 4 [IU] via SUBCUTANEOUS
  Filled 2018-04-10 (×3): qty 1

## 2018-04-10 NOTE — Progress Notes (Signed)
Patient ID: Deborah Obrien, female   DOB: 1941-04-19, 77 y.o.   MRN: [id number]     [place] Hospital Day(s): 5.   Post op day(s):  Marland Kitchen   Interval History: Patient seen and examined, no acute events or new complaints overnight. Patient sedated on mechanical ventilation. History and event taken from nurse.    Vital signs in last 24 hours: [min-max] current  Temp:  [99.1 F (37.3 C)-100.9 F (38.3 C)] 99.2 F (37.3 C) (07/30 2000) Pulse Rate:  [63-134] 71 (07/30 2000) Resp:  [14-32] 19 (07/30 2000) BP: (102-154)/(35-99) 116/60 (07/30 2000) SpO2:  [91 %-100 %] 96 % (07/30 2000) FiO2 (%):  [30 %-40 %] 30 % (07/30 1937) Weight:  [126.5 kg (278 lb 14.1 oz)] 126.5 kg (278 lb 14.1 oz) (07/30 0500)     Height: 5\' 6"  (167.6 cm) Weight: 126.5 kg (278 lb 14.1 oz) BMI (Calculated): 45.03   Physical Exam:  Constitutional: sedated, no distress  Gastrointestinal: soft, non-tender, and non-distended  Labs:  CBC Latest Ref Rng & Units 04/10/2018 04/09/2018 04/08/2018  WBC 3.6 - 11.0 K/uL 11.3(H) 8.6 6.1  Hemoglobin 12.0 - 16.0 g/dL 8.6(L) 9.4(L) 8.8(L)  Hematocrit 35.0 - 47.0 % 26.0(L) 29.2(L) 26.3(L)  Platelets 150 - 440 K/uL 176 166 136(L)   CMP Latest Ref Rng & Units 04/10/2018 04/09/2018 04/08/2018  Glucose 70 - 99 mg/dL 179(H) 184(H) 170(H)  BUN 8 - 23 mg/dL 64(H) 73(H) 75(H)  Creatinine 0.44 - 1.00 mg/dL 1.80(H) 1.96(H) 1.86(H)  Sodium 135 - 145 mmol/L 153(H) 152(H) 148(H)  Potassium 3.5 - 5.1 mmol/L 4.0 5.1 5.3(H)  Chloride 98 - 111 mmol/L 120(H) 123(H) 122(H)  CO2 22 - 32 mmol/L 28 17(L) 24  Calcium 8.9 - 10.3 mg/dL 8.1(L) 8.4(L) 8.2(L)  Total Protein 6.5 - 8.1 g/dL - 5.8(L) -  Total Bilirubin 0.3 - 1.2 mg/dL - 1.0 -  Alkaline Phos 38 - 126 U/L - 72 -  AST 15 - 41 U/L - 42(H) -  ALT 0 - 44 U/L - 28 -    Imaging studies: No new pertinent imaging studies   Assessment/Plan:  77 y.o.femalewith small bowel obstruction and upper GI bleeding, complicated by pertinent  comorbidities includingatrial fibrillation on anticoagulation, CHF, sinus node dysfunction with pacemaker, chronic kidney disease, morbid obesity, history of bleeding gastric polyps.  Patient continue critically ill, on vasopressors, respiratory failure on mechanical ventilation. Regarding her abdominal small bowel obstruction it appeared to be resolved. Currently patient is receiving enteral feeding Vital per NGT to 40 mL/hr (goal). No bowel movement documented in last 48 hours. No vomiting. Will follow.   Arnold Long, MD

## 2018-04-10 NOTE — Progress Notes (Signed)
RN spoke with Dr. Saunders Revel via telephone and made MD aware that amio drip was stopped early and that PO amio has not been given but that patient has went back into afib with RVR.  Dr. Saunders Revel gave order to restart amio drip at 30 mg/H and to discontinue PO amio.

## 2018-04-10 NOTE — Progress Notes (Signed)
[facility] [place] NAME: Alayah Knouff    MR#:  [id number]  DATE OF BIRTH:  1941-01-12  SUBJECTIVE:   Remains sedated and intubated, FiO2 has increased, converted to normal sinus rhythm on amiodarone drip.  Family is at bedside.  REVIEW OF SYSTEMS:    Review of Systems  Unable to perform ROS: Mental acuity    Nutrition: NPO Tolerating Diet: No Tolerating PT: Await Eval.   DRUG ALLERGIES:   Allergies  Allergen Reactions  . Cephalosporins Other (See Comments)    Reaction:  Unknown   . Macrolides And Ketolides Other (See Comments)    Reaction:  Unknown   . Meperidine Shortness Of Breath, Nausea Only and Other (See Comments)    Reaction:  Stomach pain   . Prednisone Anaphylaxis and Other (See Comments)    Reaction:  Increases pts blood sugar  Pt states that she is allergic to all steroids.    . Shellfish Allergy Anaphylaxis, Shortness Of Breath, Diarrhea, Nausea Only and Rash    Reaction:  Stomach pain   . Sulfa Antibiotics Shortness Of Breath, Diarrhea, Nausea Only and Other (See Comments)    Reaction:  Stomach pain   . Uloric [Febuxostat] Anaphylaxis    Locks pt's body up   . Aspirin Other (See Comments)    Reaction:  Stomach pain   . Celecoxib Other (See Comments)    Reaction:  GI bleed, weakness, and stomach pain.    . Cephalexin Diarrhea, Nausea Only and Other (See Comments)    Reaction:  Stomach pain   . Erythromycin Diarrhea, Nausea Only and Other (See Comments)    Reaction:  Stomach pain   . Hydromorphone Other (See Comments)    Reaction:  Hypotension   . Iodinated Diagnostic Agents Rash and Other (See Comments)    Pt states that she is unable to have because she has chronic kidney disease.    Marland Kitchen Oxycodone Other (See Comments)    Reaction:  Stomach pain   . Atorvastatin Nausea Only and Other (See Comments)    Reaction:  Weakness   . Codeine Diarrhea, Nausea Only and Other (See Comments)    Reaction:  Stomach  pain Pt tolerates morphine   . Doxycycline     Stomach pain   . Tape Other (See Comments)    Reaction:  Causes pts skin to tear  Pt states that she is able to use paper tape.    . Valdecoxib Swelling and Other (See Comments)    Pt states that her hands and feet swell.    . Iodine Rash    VITALS:  Blood pressure (!) 131/46, pulse 64, temperature 99.5 F (37.5 C), temperature source Oral, resp. rate 18, height 5\' 6"  (1.676 m), weight 126.5 kg (278 lb 14.1 oz), SpO2 99 %.  PHYSICAL EXAMINATION:   Physical Exam  GENERAL:  77 y.o.-year-old obese patient lying in bed intubated & sedated.  EYES: Pupils equal, round, reactive to light. No scleral icterus. Extraocular muscles intact.  HEENT: Head atraumatic, normocephalic. ET and NG tube in place.  NECK:  Supple, no jugular venous distention. No thyroid enlargement, no tenderness.  LUNGS: Normal breath sounds bilaterally, no wheezing, rales, rhonchi. No use of accessory muscles of respiration.  CARDIOVASCULAR: S1, S2 normal. No murmurs, rubs, or gallops.  ABDOMEN: Soft, NT, ND. Bowel sounds present. No organomegaly or mass.  EXTREMITIES: No cyanosis, clubbing or edema b/l.    NEUROLOGIC: Sedated & Intubated  PSYCHIATRIC: Sedated & Intubated SKIN: No obvious rash, lesion, or ulcer.    LABORATORY PANEL:   CBC Recent Labs  Lab 04/10/18 0442  WBC 11.3*  HGB 8.6*  HCT 26.0*  PLT 176   ------------------------------------------------------------------------------------------------------------------  Chemistries  Recent Labs  Lab 04/09/18 0248  04/10/18 0442  NA 152*  --  153*  K 5.1  --  4.0  CL 123*  --  120*  CO2 17*  --  28  GLUCOSE 184*  --  179*  BUN 73*  --  64*  CREATININE 1.96*  --  1.80*  CALCIUM 8.4*  --  8.1*  MG  --    < > 2.0  AST 42*  --   --   ALT 28  --   --   ALKPHOS 72  --   --   BILITOT 1.0  --   --    < > = values in this interval not displayed.    ------------------------------------------------------------------------------------------------------------------  Cardiac Enzymes Recent Labs  Lab 04/09/18 1458  TROPONINI 0.09*   ------------------------------------------------------------------------------------------------------------------  RADIOLOGY:  Portable Chest Xray  Result Date: 04/10/2018 CLINICAL DATA:  Acute respiratory failure EXAM: PORTABLE CHEST 1 VIEW COMPARISON:  04/09/2018 FINDINGS: Cardiac shadow is mildly prominent. Pacing device is again seen. Nasogastric catheter and endotracheal tube as well as a right-sided PICC line are again noted and stable. Postsurgical changes in the cervical spine are seen. Stable right-sided pleural effusion is noted. Mild vascular congestion remains. IMPRESSION: Overall stable appearance of the chest when compare with the previous day. Electronically Signed   By: Inez Catalina M.D.   On: 04/10/2018 07:04   Dg Chest Port 1 View  Result Date: 04/09/2018 CLINICAL DATA:  Acute respiratory failure.  Intubation. EXAM: PORTABLE CHEST 1 VIEW COMPARISON:  Radiograph 04/06/2018 FINDINGS: Tip of the endotracheal tube approximately 4.2 cm from the carina. Enteric tube in place with tip below the diaphragm not included in the field of view. Right upper extremity PICC tip in the proximal SVC. Left-sided pacemaker remains in place. Hazy opacity throughout the right hemithorax likely layering pleural effusion with associated atelectasis or airspace disease. Increasing left basilar opacity may be effusion and atelectasis/airspace disease. No pneumothorax. IMPRESSION: 1. Endotracheal tube tip approximately 4.2 cm from the carina. 2. Progressive hazy opacity throughout the right hemithorax, likely layering pleural effusion with associated atelectasis or airspace disease. Left basilar opacity consistent with pleural fluid and atelectasis/airspace disease. Electronically Signed   By: Jeb Levering M.D.   On:  04/09/2018 02:55     ASSESSMENT AND PLAN:   77 year old female with past medical history of paroxysmal atrial fibrillation status post pacemaker, previous history of TIA, hypertension, morbid obesity, diabetes, chronic kidney disease stage III, anxiety, GERD who presented to the hospital due to abdominal pain and noted to have partial small bowel obstruction.  1.  Acute on chronic respiratory failure with hypoxia and hypercapnia- due to underlying COPD, morbid obesity. -Patient had respiratory arrest 2 days ago and became more hypercapnic and had to be intubated. -Suspect to be secondary to aspiration pneumonia.  -Continue IV Vancomycin, Unasyn, continue vent support as per pulmonary/intensivist.  Not ready to be weaned off the vent yet.  2.  Acute partial small bowel obstruction- because of patient's worsening abdominal pain. - Seen by general surgery and no plans for surgical intervention.  -Continue NG tube decompression, no plans for surgical intervention.  Continue supportive care and improving.   3.  Acute hematemesis-secondary to GI bleed/Mallory-Weiss tear/Hyperplastic  Polyps.  -Seen by gastroenterology and no plans for endoscopy at this time.  Cont. IV Pepcid.   4.  Acute blood loss anemia-secondary to the Mallory-Weiss tear. - Patient was transfused a total of 2 units of packed red blood cells and hemoglobin has improved posttransfusion we will continue to monitor.  No further bleeding overnight.  5.  Atrial fibrillation with rapid ventricular response- she has now converted to normal sinus rhythm, appreciate cardiology input.  Continue amiodarone drip for now.  Holding Eliquis given the hematemesis and suspected GI bleed.   6.  Hypernatremia- continue free water flushes, follow sodium.  7.  Aspiration pneumonia-continue IV Unasyn.  8. Hyperkalemia - due to Acute on Chronic renal failure.  - improved  All the records are reviewed and case discussed with Care  Management/Social Worker. Management plans discussed with the patient, family and they are in agreement.  CODE STATUS: Full code  DVT Prophylaxis: Ted's & SCD's.   TOTAL TIME TAKING CARE OF THIS PATIENT: 30 minutes.   POSSIBLE D/C unclear, DEPENDING ON CLINICAL CONDITION and course.   Henreitta Leber M.D on 04/10/2018 at 3:07 PM  Between 7am to 6pm - Pager - [phone]  After 6pm go to [email or web address] - Technical brewer [facility]  Office  [phone]  CC: Primary care physician; [name], MD

## 2018-04-10 NOTE — Progress Notes (Signed)
Inpatient Diabetes Program Recommendations  AACE/ADA: New Consensus Statement on Inpatient Glycemic Control (2015)  Target Ranges:  Prepandial:   less than 140 mg/dL      Peak postprandial:   less than 180 mg/dL (1-2 hours)      Critically ill patients:  140 - 180 mg/dL   Results for Deborah Obrien, Deborah Obrien (MRN [id number]) as of 04/10/2018 08:10  Ref. Range 04/09/2018 00:11 04/09/2018 02:34 04/09/2018 07:25 04/09/2018 12:11 04/09/2018 16:31 04/09/2018 18:03 04/09/2018 19:10 04/09/2018 20:15 04/09/2018 21:19 04/09/2018 22:25 04/09/2018 23:29  Glucose-Capillary Latest Ref Range: 70 - 99 mg/dL 130 (H)  1 unit NOVOLOG 159 (H)  2 units NOVOLOG 174 (H)  2 units NOVOLOG 178 (H)  2 units NOVOLOG  266 (H) 281 (H)  IV Insulin Drip 243 (H)  IV Insulin Drip 201 (H)  IV Insulin Drip 264 (H)  IV Insulin Drip 192 (H)  IV Insulin Drip 176 (H)  IV Insulin Drip   Results for Deborah Obrien, Deborah Obrien (MRN [id number]) as of 04/10/2018 08:10  Ref. Range 04/10/2018 00:23 04/10/2018 01:33 04/10/2018 02:37 04/10/2018 03:47 04/10/2018 07:07  Glucose-Capillary Latest Ref Range: 70 - 99 mg/dL 144 (H)  IV Insulin Drip 124 (H)  IV Insulin Drip 153 (H)  IV Insulin Drip 152 (H)  IV Insulin Drip 134 (H)  IV Insulin Drip    Home DM Meds: 70/30 Insulin- 22 units BID  Current Insulin Orders: IV Insulin Drip per Phase 2 of the ICU Glycemic Control Protocol     Note CBGs became elevated >250 mg/dl last evening.  Patient currently Intubated.       Tube feeds are currently at goal rate.  Patient has had 6 subsequent CBGs in a row <180 mg/dl.  Insulin Drip rates currently less than 4 units/hr.     MD- With the above criteria met, patient qualifies to start transition process off the IV Insulin drip to Phase 3 of the ICU Glycemic Control order set.  Please consider initiating Phase 3 transition orders today.     --Will follow patient during hospitalization--  Wyn Quaker RN, MSN, CDE Diabetes  Coordinator Inpatient Glycemic Control Team Team Pager: [phone] (8a-5p)

## 2018-04-10 NOTE — Progress Notes (Addendum)
Patient agitated squirming in bed and biting ET tube at times.  Follows commands.  Dr. Jefferson Fuel at bedside and gave order to increase PRN versed to 2 mg q2H and stated "thats her wake up assessment continue her sedation and increase fentanyl drip if you need to."  Dr. Jefferson Fuel updated husband at bedside and discussed possible breathing trial tomorrow morning.

## 2018-04-10 NOTE — Progress Notes (Signed)
*  PRELIMINARY RESULTS* Echocardiogram 2D Echocardiogram has been performed.  Deborah Obrien 04/10/2018, 9:53 AM

## 2018-04-10 NOTE — Progress Notes (Signed)
Pharmacy Electrolyte Monitoring Consult:  Pharmacy consulted to assist in monitoring and replacing electrolytes and glucose management in this 77 y.o. female admitted on 04/04/2018 with Hematemesis and Abdominal Pain   Labs:  Sodium (mmol/L)  Date Value  04/10/2018 153 (H)  10/16/2014 141   Potassium (mmol/L)  Date Value  04/10/2018 4.0  10/16/2014 4.6   Magnesium (mg/dL)  Date Value  04/10/2018 2.0   Phosphorus (mg/dL)  Date Value  04/09/2018 3.8   Calcium (mg/dL)  Date Value  04/10/2018 8.1 (L)   Calcium, Total (mg/dL)  Date Value  10/16/2014 8.5   Albumin (g/dL)  Date Value  04/09/2018 2.9 (L)  10/29/2012 3.9    Assessment/Plan: 1. Electrolytes: No additional supplementation needed. Goal potassium ~ 4 and goal magnesium ~ 2.   Will recheck electrolytes with am labs.   2. Glucose: patient uses NPH 22 units BID with meals as an outpatient. Patient's last 6 CBGs < 180, Insulin drip rate at 2.8 units/hr. Will transition patient to phase III of ICU hyperglycemic protocol.   Pharmacy will continue to monitor and adjust per consult.   Paulina Fusi, PharmD, BCPS 04/10/2018 3:37 PM

## 2018-04-10 NOTE — Progress Notes (Signed)
CRITICAL CARE NOTE  CC  follow up respiratory failure secondary to aspiration pneumonia with upper GI bleed and small bowel obstruction  SUBJECTIVE Patient remains critically ill Prognosis is guarded  77 year old female with respiratory failure secondary to aspiration pneumonia. Admitted on 7/25 with upper GI bleed and SBO. Patient is intubated and sedated. Patient is following commands.   SIGNIFICANT EVENTS  Patient continues to become agitated overnight every hour. Will increased PRN versed to 31m.  BP (!) 145/46   Pulse 70   Temp (!) 100.5 F (38.1 C) (Oral)   Resp 19   Ht 5' 6"  (1.676 m)   Wt 126.5 kg (278 lb 14.1 oz)   SpO2 100%   BMI 45.01 kg/m    REVIEW OF SYSTEMS  PATIENT IS UNABLE TO PROVIDE COMPLETE REVIEW OF SYSTEM S DUE TO SEVERE CRITICAL ILLNESS AND ENCEPHALOPATHY   PHYSICAL EXAMINATION:  GENERAL:critically ill appearing, +resp distress HEAD: Normocephalic, atraumatic.  EYES: Pupils equal, round, reactive to light.  No scleral icterus.  MOUTH: Moist mucosal membrane. NECK: Supple. No thyromegaly. No nodules. No JVD.  PULMONARY: +rhonchi, +wheezing CARDIOVASCULAR: S1 and S2. Regular rate and rhythm. No murmurs, rubs, or gallops.  GASTROINTESTINAL: Soft, nontender, -distended. No masses. Positive bowel sounds. No hepatosplenomegaly.  MUSCULOSKELETAL: No swelling, clubbing, or edema.  NEUROLOGIC: following commands, agitated SKIN:intact,warm,dry  ASSESSMENT AND PLAN SYNOPSIS  77year old female with respiratory failure secondary to aspiration pneumonia and COPD. Patient intubated and sedated. Patient requiring neo-synephrine for blood pressure support.    Severe Hypercapnic respiratory failure secondary to COPD and aspiration pneumonia -continue Full MV support -continue Bronchodilator Therapy: duoneb -Wean Fio2 and PEEP as tolerated -will perform SAT/SBt when respiratory parameters are met  -will hold off until tomorrow -CXR: shows improvement  from 7/29  AKI with CKD Stage III: -potassium 4.0 -creatinine improving: 13.88-metabolic acidosis resolving: bicarb discontinued -hypernatremia: 153, increase free water intake    GASTROINTESTINAL: -SBO resolving: NG tube continued -UGI bleed secondary to possibly mallory weiss tear: no further evidence of GI bleed -acute blood loss anemia  -hemoglobin 9.4-->8.6. Continue to trend   NEUROLOGY - intubated and sedated: fentanyl and versed -acute encephalopathy secondary to respiratory failure -minimal sedation to achieve RASS goal: -1   CARDIAC -appreciate cardiology consulting -Atrial fibrillation: patient converted to NSR yesterday afternoon  -switch IV amiodarone to PO amiodarone -consider echo: BNP 1,164.0 -continue neo-synephrine for pressure support   ID -continue IV abx as prescribed: Unasyn and vancomycin -respiratory culture pending  -gram stain shows WBC present (PMN and mononuclear) with gram +  cocci and gram - rods -Leukocytosis: 11.3 with procalcitonin of 3.5 -fever this morning: 100.5  ENDOCRINE: -diabetes mellitus: continue SSI   DVT: SCD GI: pepcid   TRANSFUSIONS AS NEEDED MONITOR FSBS ASSESS the need for LABS as needed   Critical Care Time devoted to patient care services described in this note is 28 minutes.   Overall, patient is critically ill, prognosis is guarded.  Patient with Multiorgan failure and at high risk for cardiac arrest and death.

## 2018-04-10 NOTE — Progress Notes (Signed)
Progress Note  Patient Name: Deborah Obrien Date of Encounter: 04/10/2018  Primary Cardiologist: [place] intubated and sedated.  Brief episode of A. fib with RVR on 7/29.  Husband at bedside.  Hemoglobin remains low at 8.6.  BNP elevated at 1164.  Sodium elevated at 153, serum creatinine improved from 1.96-1.80.  Potassium at goal 4.0.  Inpatient Medications    Scheduled Meds: . chlorhexidine gluconate (MEDLINE KIT)  15 mL Mouth Rinse BID  . docusate  100 mg Oral BID  . famotidine  20 mg Per Tube QHS  . feeding supplement (PRO-STAT SUGAR FREE 64)  30 mL Per Tube TID  . feeding supplement (VITAL HIGH PROTEIN)  1,000 mL Per Tube Q24H  . free water  200 mL Per Tube Q3H  . insulin aspart  2-6 Units Subcutaneous Q4H  . insulin aspart  5 Units Subcutaneous Q4H  . insulin detemir  15 Units Subcutaneous Q12H  . ipratropium-albuterol  3 mL Nebulization Q6H  . mouth rinse  15 mL Mouth Rinse 10 times per day  . multivitamin  15 mL Per Tube Daily  . sodium chloride flush  10-40 mL Intracatheter Q12H   Continuous Infusions: . amiodarone 30 mg/hr (04/10/18 1013)  . ampicillin-sulbactam (UNASYN) IV Stopped (04/10/18 0845)  . dexmedetomidine (PRECEDEX) IV infusion Stopped (04/09/18 0329)  . dextrose    . fentaNYL infusion INTRAVENOUS 250 mcg/hr (04/10/18 0800)  . insulin (NOVOLIN-R) infusion 2.8 Units/hr (04/10/18 0917)  . phenylephrine (NEO-SYNEPHRINE) Adult infusion 90 mcg/min (04/10/18 1055)  . vancomycin Stopped (04/10/18 0228)   PRN Meds: acetaminophen **OR** acetaminophen, bisacodyl, dextrose, fentaNYL, midazolam, ondansetron **OR** ondansetron (ZOFRAN) IV, phenol, sennosides, sodium chloride flush   Vital Signs    Vitals:   04/10/18 0749 04/10/18 0800 04/10/18 0900 04/10/18 1000  BP:  (!) 145/46 (!) 143/53 (!) 125/55  Pulse: 72 70 73 (!) 121  Resp: 18 19 18 15   Temp: (!) 100.5 F (38.1 C)     TempSrc: Oral     SpO2: 100% 100% 100% 99%  Weight:       Height:        Intake/Output Summary (Last 24 hours) at 04/10/2018 1109 Last data filed at 04/10/2018 0800 Gross per 24 hour  Intake 4162.75 ml  Output 975 ml  Net 3187.75 ml   Filed Weights   04/08/18 0609 04/09/18 1427 04/10/18 0500  Weight: 251 lb 8.7 oz (114.1 kg) 268 lb 11.9 oz (121.9 kg) 278 lb 14.1 oz (126.5 kg)    Telemetry    NSR, episode of A. fib at 1215- Personally Reviewed  ECG    n/a - Personally Reviewed  Physical Exam   GEN:  Elderly and chronically ill-appearing; no acute distress.   Neck: No JVD. Cardiac: RRR, II/VI systolic murmur LSB, no rubs, or gallops.  Respiratory:  Diminished breath sounds bilaterally.  GI: Soft, nontender, non-distended.   MS:  Trace bilateral pretibial edema; No deformity. Neuro:   Intubated and sedated.  Psych: Intubated and sedated.  Labs    Chemistry Recent Labs  Lab 04/04/18 2027 04/06/18 0509  04/08/18 0543 04/09/18 0248 04/10/18 0442  NA 143 143   < > 148* 152* 153*  K 4.2 6.7*   < > 5.3* 5.1 4.0  CL 109 114*   < > 122* 123* 120*  CO2 25 22   < > 24 17* 28  GLUCOSE 102* 244*   < > 170* 184* 179*  BUN 22 73*   < >  75* 73* 64*  CREATININE 1.53* 2.90*   < > 1.86* 1.96* 1.80*  CALCIUM 9.6 7.6*   < > 8.2* 8.4* 8.1*  PROT 7.4 5.3*  --   --  5.8*  --   ALBUMIN 4.0 2.9*  --   --  2.9*  --   AST 39 27  --   --  42*  --   ALT 19 19  --   --  28  --   ALKPHOS 92 70  --   --  72  --   BILITOT 1.4* 0.7  --   --  1.0  --   GFRNONAA 32* 15*   < > 25* 23* 26*  GFRAA 37* 17*   < > 29* 27* 30*  ANIONGAP 9 7   < > 2* 12 5   < > = values in this interval not displayed.     Hematology Recent Labs  Lab 04/08/18 0543 04/09/18 0251 04/10/18 0442  WBC 6.1 8.6 11.3*  RBC 2.68* 2.91* 2.65*  HGB 8.8* 9.4* 8.6*  HCT 26.3* 29.2* 26.0*  MCV 98.2 100.3* 98.2  MCH 32.7 32.5 32.4  MCHC 33.3 32.4 33.0  RDW 16.4* 16.4* 16.2*  PLT 136* 166 176    Cardiac Enzymes Recent Labs  Lab 04/04/18 2027 04/09/18 0251  04/09/18 0951 04/09/18 1458  TROPONINI <0.03 0.08* 0.10* 0.09*   No results for input(s): TROPIPOC in the last 168 hours.   BNP Recent Labs  Lab 04/10/18 0442  BNP 1,164.0*     DDimer No results for input(s): DDIMER in the last 168 hours.   Radiology    Portable Chest Xray  Result Date: 04/10/2018 IMPRESSION: Overall stable appearance of the chest when compare with the previous day. Electronically Signed   By: Inez Catalina M.D.   On: 04/10/2018 07:04   Dg Chest Port 1 View  Result Date: 04/09/2018 IMPRESSION: 1. Endotracheal tube tip approximately 4.2 cm from the carina. 2. Progressive hazy opacity throughout the right hemithorax, likely layering pleural effusion with associated atelectasis or airspace disease. Left basilar opacity consistent with pleural fluid and atelectasis/airspace disease. Electronically Signed   By: Jeb Levering M.D.   On: 04/09/2018 02:55    Cardiac Studies   Echo pending  Patient Profile     77 y.o. 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  Assessment & Plan    1.  PAF with RVR: -Initially, plans were to discontinue IV amiodarone and transition to p.o. given patient has maintained sinus rhythm for the most part over the past 24 hours.  However, patient redeveloped A. fib this morning prior to p.o. amiodarone being given.  IV amiodarone has since been restarted, which we will continue for now. -High risk of recurrent arrhythmia -Has not been on anticoagulation/heparin given anemia with upper GI bleed, resume when/if able -Consider changing albuterol nebs to Xopenex  2.  Acute respiratory failure with hypoxia and hypercapnia -In the setting of underlying severe COPD, morbid obesity, and possible OHS with possible aspiration pneumonia -Mechanical ventilation per PCCM  3.  Partial SBO: -NG tube decompression -Currently no plans  for surgical intervention -Advance p.o. medications when able  4.  Acute blood loss anemia/upper GI bleed: -Hemoglobin low though stable -GI has evaluated the patient with no plans for endoscopy at this time -Per IM  For questions or updates, please contact [facility] Please consult [email or web address] for contact info under Cardiology/STEMI.    Signed, Christell Faith, PA-C [facility] Pager: [phone] 04/10/2018, 11:09 AM

## 2018-04-11 ENCOUNTER — Inpatient Hospital Stay: Payer: Medicare Other

## 2018-04-11 DIAGNOSIS — K56609 Unspecified intestinal obstruction, unspecified as to partial versus complete obstruction: Secondary | ICD-10-CM

## 2018-04-11 LAB — CBC
HCT: 25 % — ABNORMAL LOW (ref 35.0–47.0)
HCT: 21.5 % — ABNORMAL LOW (ref 35.0–47.0)
Hemoglobin: 8.4 g/dL — ABNORMAL LOW (ref 12.0–16.0)
Hemoglobin: 7 g/dL — ABNORMAL LOW (ref 12.0–16.0)
MCH: 32.6 pg (ref 26.0–34.0)
MCH: 32.7 pg (ref 26.0–34.0)
MCHC: 33.5 g/dL (ref 32.0–36.0)
MCHC: 32.5 g/dL (ref 32.0–36.0)
MCV: 97.4 fL (ref 80.0–100.0)
MCV: 100.6 fL — AB (ref 80.0–100.0)
Platelets: 177 10*3/uL (ref 150–440)
Platelets: 131 10*3/uL — ABNORMAL LOW (ref 150–440)
RBC: 2.57 MIL/uL — ABNORMAL LOW (ref 3.80–5.20)
RBC: 2.13 MIL/uL — ABNORMAL LOW (ref 3.80–5.20)
RDW: 15.9 % — AB (ref 11.5–14.5)
RDW: 16.2 % — AB (ref 11.5–14.5)
WBC: 10.4 10*3/uL (ref 3.6–11.0)
WBC: 7.5 10*3/uL (ref 3.6–11.0)

## 2018-04-11 LAB — GLUCOSE, CAPILLARY
Glucose-Capillary: 167 mg/dL — ABNORMAL HIGH (ref 70–99)
Glucose-Capillary: 160 mg/dL — ABNORMAL HIGH (ref 70–99)
Glucose-Capillary: 148 mg/dL — ABNORMAL HIGH (ref 70–99)
Glucose-Capillary: 246 mg/dL — ABNORMAL HIGH (ref 70–99)
Glucose-Capillary: 290 mg/dL — ABNORMAL HIGH (ref 70–99)
Glucose-Capillary: 281 mg/dL — ABNORMAL HIGH (ref 70–99)
Glucose-Capillary: 228 mg/dL — ABNORMAL HIGH (ref 70–99)
Glucose-Capillary: 200 mg/dL — ABNORMAL HIGH (ref 70–99)
Glucose-Capillary: 176 mg/dL — ABNORMAL HIGH (ref 70–99)
Glucose-Capillary: 152 mg/dL — ABNORMAL HIGH (ref 70–99)
Glucose-Capillary: 166 mg/dL — ABNORMAL HIGH (ref 70–99)

## 2018-04-11 LAB — BLOOD GAS, ARTERIAL
Acid-Base Excess: 1.6 mmol/L (ref 0.0–2.0)
Bicarbonate: 25.8 mmol/L (ref 20.0–28.0)
FIO2: 0.3
MECHVT: 500 mL
O2 SAT: 96.9 %
PATIENT TEMPERATURE: 37
PCO2 ART: 38 mmHg (ref 32.0–48.0)
PEEP/CPAP: 5 cmH2O
RATE: 18 resp/min
pH, Arterial: 7.44 (ref 7.350–7.450)
pO2, Arterial: 86 mmHg (ref 83.0–108.0)

## 2018-04-11 LAB — BASIC METABOLIC PANEL
Anion gap: 4 — ABNORMAL LOW (ref 5–15)
BUN: 56 mg/dL — AB (ref 8–23)
CALCIUM: 7.7 mg/dL — AB (ref 8.9–10.3)
CO2: 29 mmol/L (ref 22–32)
CREATININE: 1.54 mg/dL — AB (ref 0.44–1.00)
Chloride: 116 mmol/L — ABNORMAL HIGH (ref 98–111)
GFR calc non Af Amer: 31 mL/min — ABNORMAL LOW (ref >60)
GFR, EST AFRICAN AMERICAN: 36 mL/min — AB (ref >60)
GLUCOSE: 310 mg/dL — AB (ref 70–99)
Potassium: 3.5 mmol/L (ref 3.5–5.1)
Sodium: 149 mmol/L — ABNORMAL HIGH (ref 135–145)

## 2018-04-11 LAB — CULTURE, RESPIRATORY W GRAM STAIN

## 2018-04-11 LAB — PROTIME-INR
INR: 1.42
PROTHROMBIN TIME: 17.2 s — AB (ref 11.4–15.2)

## 2018-04-11 LAB — PREPARE RBC (CROSSMATCH)

## 2018-04-11 LAB — CULTURE, RESPIRATORY

## 2018-04-11 LAB — MAGNESIUM: Magnesium: 1.5 mg/dL — ABNORMAL LOW (ref 1.7–2.4)

## 2018-04-11 LAB — PROCALCITONIN: PROCALCITONIN: 0.51 ng/mL

## 2018-04-11 MED ORDER — POTASSIUM CHLORIDE 10 MEQ/100ML IV SOLN
10.0000 meq | INTRAVENOUS | Status: AC
  Administered 2018-04-11 (×2): 10 meq via INTRAVENOUS
  Filled 2018-04-11 (×2): qty 100

## 2018-04-11 MED ORDER — INSULIN ASPART 100 UNIT/ML ~~LOC~~ SOLN
0.0000 [IU] | SUBCUTANEOUS | Status: DC
  Administered 2018-04-11: 8 [IU] via SUBCUTANEOUS
  Administered 2018-04-11 (×4): 3 [IU] via SUBCUTANEOUS
  Administered 2018-04-11: 5 [IU] via SUBCUTANEOUS
  Administered 2018-04-12 – 2018-04-13 (×2): 2 [IU] via SUBCUTANEOUS
  Administered 2018-04-13 (×3): 3 [IU] via SUBCUTANEOUS
  Administered 2018-04-14: 2 [IU] via SUBCUTANEOUS
  Administered 2018-04-14: 3 [IU] via SUBCUTANEOUS
  Administered 2018-04-14: 5 [IU] via SUBCUTANEOUS
  Administered 2018-04-14 – 2018-04-15 (×4): 3 [IU] via SUBCUTANEOUS
  Filled 2018-04-11 (×18): qty 1

## 2018-04-11 MED ORDER — INSULIN DETEMIR 100 UNIT/ML ~~LOC~~ SOLN
20.0000 [IU] | Freq: Two times a day (BID) | SUBCUTANEOUS | Status: DC
  Administered 2018-04-11 – 2018-04-12 (×3): 20 [IU] via SUBCUTANEOUS
  Filled 2018-04-11 (×6): qty 0.2

## 2018-04-11 MED ORDER — INSULIN ASPART 100 UNIT/ML ~~LOC~~ SOLN
5.0000 [IU] | SUBCUTANEOUS | Status: DC
  Administered 2018-04-11: 5 [IU] via SUBCUTANEOUS

## 2018-04-11 MED ORDER — INSULIN ASPART 100 UNIT/ML ~~LOC~~ SOLN: 0.0000 [IU] | SUBCUTANEOUS | Status: DC

## 2018-04-11 MED ORDER — MAGNESIUM SULFATE 4 GM/100ML IV SOLN
4.0000 g | Freq: Once | INTRAVENOUS | Status: AC
  Administered 2018-04-11: 4 g via INTRAVENOUS
  Filled 2018-04-11: qty 100

## 2018-04-11 MED ORDER — SODIUM CHLORIDE 0.9 % IV SOLN: 4.0000 g | Freq: Once | INTRAVENOUS | Status: DC

## 2018-04-11 MED ORDER — INSULIN ASPART 100 UNIT/ML ~~LOC~~ SOLN
5.0000 [IU] | SUBCUTANEOUS | Status: DC
  Administered 2018-04-11 – 2018-04-13 (×8): 5 [IU] via SUBCUTANEOUS
  Filled 2018-04-11 (×8): qty 1

## 2018-04-11 MED ORDER — METOPROLOL TARTRATE 5 MG/5ML IV SOLN
2.5000 mg | INTRAVENOUS | Status: DC | PRN
  Filled 2018-04-11: qty 5

## 2018-04-11 MED ORDER — SODIUM CHLORIDE 0.9% IV SOLUTION
Freq: Once | INTRAVENOUS | Status: AC
  Administered 2018-04-11: 22:00:00 via INTRAVENOUS

## 2018-04-11 NOTE — Progress Notes (Signed)
[facility] NAME: Deborah Obrien    MR#:  [id number]  DATE OF BIRTH:  01/01/1941  SUBJECTIVE:   She remains intubated and sedated.  Difficult to wean off the vent and failed a spontaneous breathing trial due to some sedation.  She remains on a low-dose phenylephrine drip.  REVIEW OF SYSTEMS:    Review of Systems  Unable to perform ROS: Mental acuity    Nutrition: Tube feedings  Tolerating Diet: yes Tolerating PT: Await Eval.   DRUG ALLERGIES:   Allergies  Allergen Reactions  . Cephalosporins Other (See Comments)    Reaction:  Unknown   . Macrolides And Ketolides Other (See Comments)    Reaction:  Unknown   . Meperidine Shortness Of Breath, Nausea Only and Other (See Comments)    Reaction:  Stomach pain   . Prednisone Anaphylaxis and Other (See Comments)    Reaction:  Increases pts blood sugar  Pt states that she is allergic to all steroids.    . Shellfish Allergy Anaphylaxis, Shortness Of Breath, Diarrhea, Nausea Only and Rash    Reaction:  Stomach pain   . Sulfa Antibiotics Shortness Of Breath, Diarrhea, Nausea Only and Other (See Comments)    Reaction:  Stomach pain   . Uloric [Febuxostat] Anaphylaxis    Locks pt's body up   . Aspirin Other (See Comments)    Reaction:  Stomach pain   . Celecoxib Other (See Comments)    Reaction:  GI bleed, weakness, and stomach pain.    . Cephalexin Diarrhea, Nausea Only and Other (See Comments)    Reaction:  Stomach pain   . Erythromycin Diarrhea, Nausea Only and Other (See Comments)    Reaction:  Stomach pain   . Hydromorphone Other (See Comments)    Reaction:  Hypotension   . Iodinated Diagnostic Agents Rash and Other (See Comments)    Pt states that she is unable to have because she has chronic kidney disease.    Marland Kitchen Oxycodone Other (See Comments)    Reaction:  Stomach pain   . Atorvastatin Nausea Only and Other (See Comments)    Reaction:  Weakness   . Codeine Diarrhea,  Nausea Only and Other (See Comments)    Reaction:  Stomach pain Pt tolerates morphine   . Doxycycline     Stomach pain   . Tape Other (See Comments)    Reaction:  Causes pts skin to tear  Pt states that she is able to use paper tape.    . Valdecoxib Swelling and Other (See Comments)    Pt states that her hands and feet swell.    . Iodine Rash    VITALS:  Blood pressure (!) 126/43, pulse (!) 59, temperature (!) 100.7 F (38.2 C), temperature source Axillary, resp. rate (!) 9, height 5\' 6"  (1.676 m), weight 129.9 kg (286 lb 6 oz), SpO2 98 %.  PHYSICAL EXAMINATION:   Physical Exam  GENERAL:  77 y.o.-year-old obese patient lying in bed intubated & sedated.  EYES: Pupils equal, round, reactive to light. No scleral icterus. Extraocular muscles intact.  HEENT: Head atraumatic, normocephalic. ET and NG tube in place.  NECK:  Supple, no jugular venous distention. No thyroid enlargement, no tenderness.  LUNGS: Normal breath sounds bilaterally, no wheezing, rales, rhonchi. No use of accessory muscles of respiration.  CARDIOVASCULAR: S1, S2 normal. No murmurs, rubs, or gallops.  ABDOMEN: Soft, NT, ND. Bowel sounds present. No organomegaly or mass.  EXTREMITIES:  No cyanosis, clubbing or edema b/l.    NEUROLOGIC: Sedated & Intubated   PSYCHIATRIC: Sedated & Intubated SKIN: No obvious rash, lesion, or ulcer.    LABORATORY PANEL:   CBC Recent Labs  Lab 04/11/18 0312  WBC 10.4  HGB 8.4*  HCT 25.0*  PLT 177   ------------------------------------------------------------------------------------------------------------------  Chemistries  Recent Labs  Lab 04/09/18 0248  04/11/18 0312  NA 152*   < > 149*  K 5.1   < > 3.5  CL 123*   < > 116*  CO2 17*   < > 29  GLUCOSE 184*   < > 310*  BUN 73*   < > 56*  CREATININE 1.96*   < > 1.54*  CALCIUM 8.4*   < > 7.7*  MG  --    < > 1.5*  AST 42*  --   --   ALT 28  --   --   ALKPHOS 72  --   --   BILITOT 1.0  --   --    < > = values in  this interval not displayed.   ------------------------------------------------------------------------------------------------------------------  Cardiac Enzymes Recent Labs  Lab 04/09/18 1458  TROPONINI 0.09*   ------------------------------------------------------------------------------------------------------------------  RADIOLOGY:  Dg Chest Port 1 View  Result Date: 04/11/2018 CLINICAL DATA:  Ventilated patient EXAM: PORTABLE CHEST 1 VIEW COMPARISON:  04/10/18 FINDINGS: Endotracheal tube and feeding tube unchanged. PICC line unchanged. Stable cardiac silhouette. Bilateral pleural effusions. Central venous congestion. IMPRESSION: 1. Stable support apparatus. 2. Bilateral pleural effusions and central venous congestion. 3. No interval change. Electronically Signed   By: Suzy Bouchard M.D.   On: 04/11/2018 10:22   Portable Chest Xray  Result Date: 04/10/2018 CLINICAL DATA:  Acute respiratory failure EXAM: PORTABLE CHEST 1 VIEW COMPARISON:  04/09/2018 FINDINGS: Cardiac shadow is mildly prominent. Pacing device is again seen. Nasogastric catheter and endotracheal tube as well as a right-sided PICC line are again noted and stable. Postsurgical changes in the cervical spine are seen. Stable right-sided pleural effusion is noted. Mild vascular congestion remains. IMPRESSION: Overall stable appearance of the chest when compare with the previous day. Electronically Signed   By: Inez Catalina M.D.   On: 04/10/2018 07:04     ASSESSMENT AND PLAN:   77 year old female with past medical history of paroxysmal atrial fibrillation status post pacemaker, previous history of TIA, hypertension, morbid obesity, diabetes, chronic kidney disease stage III, anxiety, GERD who presented to the hospital due to abdominal pain and noted to have partial small bowel obstruction.  1.  Acute on chronic respiratory failure with hypoxia and hypercapnia- due to underlying COPD, morbid obesity. -Patient had  respiratory arrest 3 days ago and became more hypercapnic and had to be intubated. -Suspected to be secondary to aspiration pneumonia.  -Continue IV Unasyn, continue vent support as per pulmonary/intensivist.   -Patient failed spontaneous breathing trial/weaning off the one today.  2.  Acute partial small bowel obstruction- cause of patient's worsening abdominal pain. - resolved, general surgery has signed off.  Patient is on tube feedings and is tolerating well.  3.  Acute hematemesis-secondary to GI bleed/Mallory-Weiss tear/Hyperplastic Polyps.  - resolved. Hg. Stable. No bleeding and GI has signed off.  Cont. Pepcid.   4.  Acute blood loss anemia-secondary to the Mallory-Weiss tear. - Patient was transfused a total of 2 units of packed red blood cells and hemoglobin has improved posttransfusion we will continue to monitor.  No further bleeding presently.    5.  Atrial fibrillation with  rapid ventricular response- she has now converted to normal sinus rhythm, appreciate cardiology input.  Continue amiodarone drip for now.  Holding Eliquis given the hematemesis and suspected GI bleed.   6.  Hypernatremia- continue free water flushes, and sodium is improving.   7.  Aspiration pneumonia-continue IV Unasyn. - cultures so far (-).   8. Hyperkalemia - due to Acute on Chronic renal failure.  - improved with fluids, vasopressors and will follow BUn/Cr.   - renal dose meds and avoid nephrotoxins.   All the records are reviewed and case discussed with Care Management/Social Worker. Management plans discussed with the patient, family and they are in agreement.  CODE STATUS: Full code  DVT Prophylaxis: Ted's & SCD's.   TOTAL TIME TAKING CARE OF THIS PATIENT: 30 minutes.   POSSIBLE D/C unclear, DEPENDING ON CLINICAL CONDITION and course.   Henreitta Leber M.D on 04/11/2018 at 2:42 PM  Between 7am to 6pm - Pager - [phone]  After 6pm go to [email or web address] - [facility]  Office  [phone]  CC: Primary care physician; [name], MD

## 2018-04-11 NOTE — Progress Notes (Signed)
Patient ID: Deborah Obrien, female   DOB: 1940/09/26, 77 y.o.   MRN: [id number]     [facility] [place] Hospital Day(s): 6.   Post op day(s):  Marland Kitchen   Interval History: Patient seen and examined, no acute events or new complaints overnight. Patient sedated on mechanical ventilation. Report taken from nurse.   Vital signs in last 24 hours: [min-max] current  Temp:  [98.5 F (36.9 C)-100.9 F (38.3 C)] 100.7 F (38.2 C) (07/31 1200) Pulse Rate:  [36-125] 59 (07/31 1200) Resp:  [9-32] 9 (07/31 1200) BP: (85-154)/(35-92) 126/43 (07/31 1200) SpO2:  [91 %-100 %] 98 % (07/31 1212) FiO2 (%):  [30 %-40 %] 40 % (07/31 1212) Weight:  [129.9 kg (286 lb 6 oz)] 129.9 kg (286 lb 6 oz) (07/31 0311)     Height: 5\' 6"  (167.6 cm) Weight: 129.9 kg (286 lb 6 oz) BMI (Calculated): 46.24   Intake/Output this shift:  Total I/O In: 1987.6 [I.V.:537.6; NG/GT:720; IV Piggyback:730] Out: 500 [Urine:500]    Physical Exam:   Gastrointestinal: soft, non-tender, and non-distended  Labs:  CBC Latest Ref Rng & Units 04/11/2018 04/10/2018 04/09/2018  WBC 3.6 - 11.0 K/uL 10.4 11.3(H) 8.6  Hemoglobin 12.0 - 16.0 g/dL 8.4(L) 8.6(L) 9.4(L)  Hematocrit 35.0 - 47.0 % 25.0(L) 26.0(L) 29.2(L)  Platelets 150 - 440 K/uL 177 176 166   CMP Latest Ref Rng & Units 04/11/2018 04/10/2018 04/09/2018  Glucose 70 - 99 mg/dL 310(H) 179(H) 184(H)  BUN 8 - 23 mg/dL 56(H) 64(H) 73(H)  Creatinine 0.44 - 1.00 mg/dL 1.54(H) 1.80(H) 1.96(H)  Sodium 135 - 145 mmol/L 149(H) 153(H) 152(H)  Potassium 3.5 - 5.1 mmol/L 3.5 4.0 5.1  Chloride 98 - 111 mmol/L 116(H) 120(H) 123(H)  CO2 22 - 32 mmol/L 29 28 17(L)  Calcium 8.9 - 10.3 mg/dL 7.7(L) 8.1(L) 8.4(L)  Total Protein 6.5 - 8.1 g/dL - - 5.8(L)  Total Bilirubin 0.3 - 1.2 mg/dL - - 1.0  Alkaline Phos 38 - 126 U/L - - 72  AST 15 - 41 U/L - - 42(H)  ALT 0 - 44 U/L - - 28    Imaging studies: No new pertinent imaging studies   Assessment/Plan:  77 y.o.femalewith small bowel  obstruction and upper GI bleeding, complicated by pertinent comorbidities includingatrial fibrillation on anticoagulation, CHF, sinus node dysfunction with pacemaker, chronic kidney disease, morbid obesity, history of bleeding gastric polyps.  Patient continue critically ill, on vasopressors, respiratory failure on mechanical ventilation. Regarding her abdominal small bowel obstruction, there is no worsening physical exam. Continue on enteral feeding at goal without vomiting. No bowel movement documented. Will follow.     Arnold Long, MD

## 2018-04-11 NOTE — Progress Notes (Signed)
CRITICAL CARE NOTE  CC  follow up respiratory failure secondary to aspiration pneumonia with upper GI bleed and SBO  SUBJECTIVE Patient remains critically ill Prognosis is guarded  77 year old female with respiratory failure secondary to aspiration pneumonia. Patient remains intubated and sedated. She responds to voice. Remains on phenylephrine. Will plan for SAT with SBT if tolerated today.     SIGNIFICANT EVENTS  Seen by cardiology yesterday and they d/c IV amiodarone and started patient on PO. However, patient converted back into Afib and IV was restarted.   BP 112/75   Pulse (!) 125   Temp 100 F (37.8 C) (Oral)   Resp 16   Ht 5\' 6"  (1.676 m)   Wt 129.9 kg (286 lb 6 oz)   SpO2 98%   BMI 46.22 kg/m    REVIEW OF SYSTEMS  PATIENT IS UNABLE TO PROVIDE COMPLETE REVIEW OF SYSTEM S DUE TO SEVERE CRITICAL ILLNESS AND ENCEPHALOPATHY   PHYSICAL EXAMINATION:  GENERAL:critically ill appearing, +resp distress HEAD: Normocephalic, atraumatic.  EYES: Pupils equal, round, reactive to light.  No scleral icterus.  MOUTH: Moist mucosal membrane. NECK: Supple. No thyromegaly. No nodules. No JVD.  PULMONARY: +bilateral rhonchi, no wheezing.  CARDIOVASCULAR: S1 and S2. irregular rate and rhythm. No murmurs, rubs, or gallops. 1+ dorsalis pedis pulses bilaterally.  GASTROINTESTINAL: Soft, nontender, -distended. No masses. Positive bowel sounds. No hepatosplenomegaly.  MUSCULOSKELETAL: No swelling, clubbing, or edema.  NEUROLOGIC: obtunded, GCS<8 SKIN:intact,warm,dry  ASSESSMENT AND PLAN SYNOPSIS  77 year old female with respiratory failure secondary to aspiration pneumonia and COPD. Patient intubated and sedated, responding to voice.  Severe Hypercapnic Respiratory Failure secondary to aspiration pneumonia -continue Full MV support -continue Bronchodilator Therapy -Wean Fio2 and PEEP as tolerated -will perform SAT today, SBT if tolerated -CXR ordered  CARDIAC -appreciate  cardiology consulting -Atrial Fibrillation: patient converted back into afib yesterday morning  -continue IV amiodarone -wean neo-synephrine as tolerated -Echo on 7/30: shows preserved LVEF, with RVH and pulmonary hypertension. Mild left atrial dilation, mitral disease.  -concern over possible pulmonary embolism due to atrial fibrillation without anticoagulation in setting of Upper GI bleed  -in setting of AKI unsure if CTA is appropriate at this time  -will order LE doppler to evaluate for DVT  AKI with CKD stage III -creatinine continuing to improve: 1.80-->1.54 -hypernatremia improving with increased free water intake: 153-->149 -Potassium 3.5--> replace -Magnesium 1.5--> replace ABG this morning shows resolution of metabolic acidosis  GASTROINTESTINAL -SBO resolving: followed by surgery, no plan for surgical intervention -tolerating tube feeds without vomiting, no bowel movement yet -UGI bleed secondary to possibly mallory weiss tear. No evidence of further bleed  -hemoglobin holding in the 8's (8.6-->8.4) continue to monitor closely   NEUROLOGY - intubated and sedated: fentanyl and versed  - minimal sedation to achieve a RASS goal: -1   ID -continue IV abx as presrcibed: unasyn and vancomycin -follow up cultures:  -respiratory culture shows klebsiella: awaiting sensitivity panel -leukocytosis and calcitonin Improving  -fever yesterday at 1600: 100.9  ENDOCRINE: -diabetes mellitus: continue SSI   DVT: SCD GI: pepcid  TRANSFUSIONS AS NEEDED MONITOR FSBS ASSESS the need for LABS as needed   Critical Care Time devoted to patient care services described in this note is 29 minutes.   Overall, patient is critically ill, prognosis is guarded.  Patient with Multiorgan failure and at high risk for cardiac arrest and death.

## 2018-04-11 NOTE — Progress Notes (Signed)
Spoke with Dr. Jefferson Fuel regarding pt's black, tarry BM. He gave verbal orders for a CBC and INR.

## 2018-04-11 NOTE — Progress Notes (Signed)
Pharmacy Electrolyte Monitoring Consult:  Pharmacy consulted to assist in monitoring and replacing electrolytes and glucose management in this 77 y.o. female admitted on 04/04/2018 with Hematemesis and Abdominal Pain   Labs:  Sodium (mmol/L)  Date Value  04/11/2018 149 (H)  10/16/2014 141   Potassium (mmol/L)  Date Value  04/11/2018 3.5  10/16/2014 4.6   Magnesium (mg/dL)  Date Value  04/11/2018 1.5 (L)   Phosphorus (mg/dL)  Date Value  04/09/2018 3.8   Calcium (mg/dL)  Date Value  04/11/2018 7.7 (L)   Calcium, Total (mg/dL)  Date Value  10/16/2014 8.5   Albumin (g/dL)  Date Value  04/09/2018 2.9 (L)  10/29/2012 3.9    Assessment/Plan: 1. Electrolytes: Patient already received Mag Sulfate 4g IV x 1 this morning, will order KCl 39mEq IV x 2. Goal potassium ~ 4 and goal magnesium ~ 2.   Will recheck electrolytes with am labs.   2. Glucose: patient uses NPH 22 units BID with meals as an outpatient. Patient off Insulin drip and is on Levemir BID and SSI.   Pharmacy will continue to monitor and adjust per consult.   Paulina Fusi, PharmD, BCPS 04/11/2018 3:42 PM

## 2018-04-11 NOTE — Care Management (Signed)
RNCM reached out to [facility] to see if he thought patient would benefit from LTAC. Patient is currently not stable for transfer.  RNCM to revisit patient need over next couple of days.

## 2018-04-11 NOTE — Progress Notes (Addendum)
Progress Note  Patient Name: Deborah Obrien Date of Encounter: 04/11/2018  Primary Cardiologist: [place] intubated and sedated. Re-developed Afib with RVR shortly after discontinuing IV amiodarone on 7/30. Now back on IV amiodarone for rate control. Remains in Afib with RVR with ventricular rates in the low 100s to 120s bpm currently. Overnight, ventricular rates into the low 100s. Remains on phenylephrine. Magnesium low at 1.5. SCr improving from 1.96-->1.80-->1.54. Potassium 4.0-->3.5. HGB 8.6-->8.4.PCT 0.12-->0.35-->0.51.   She is + 1.6 L for the past 24 hours and net + 16 L for the admission. No BM. Receiving feeding supplement.   Inpatient Medications    Scheduled Meds: . chlorhexidine gluconate (MEDLINE KIT)  15 mL Mouth Rinse BID  . docusate  100 mg Oral BID  . famotidine  20 mg Per Tube QHS  . feeding supplement (PRO-STAT SUGAR FREE 64)  30 mL Per Tube TID  . feeding supplement (VITAL HIGH PROTEIN)  1,000 mL Per Tube Q24H  . free water  200 mL Per Tube Q3H  . insulin aspart  0-15 Units Subcutaneous Q4H  . insulin aspart  5 Units Subcutaneous Q4H  . insulin detemir  20 Units Subcutaneous BID  . ipratropium-albuterol  3 mL Nebulization Q6H  . mouth rinse  15 mL Mouth Rinse 10 times per day  . multivitamin  15 mL Per Tube Daily  . sodium chloride flush  10-40 mL Intracatheter Q12H   Continuous Infusions: . amiodarone 60 mg/hr (04/11/18 3559)  . ampicillin-sulbactam (UNASYN) IV Stopped (04/11/18 0400)  . dexmedetomidine (PRECEDEX) IV infusion Stopped (04/09/18 0329)  . fentaNYL infusion INTRAVENOUS 275 mcg/hr (04/11/18 0600)  . magnesium sulfate 1 - 4 g bolus IVPB 4 g (04/11/18 0651)  . phenylephrine (NEO-SYNEPHRINE) Adult infusion 80 mcg/min (04/11/18 0653)  . vancomycin Stopped (04/11/18 0300)   PRN Meds: acetaminophen **OR** acetaminophen, bisacodyl, fentaNYL, metoprolol tartrate, midazolam, ondansetron **OR** ondansetron (ZOFRAN) IV, phenol,  sennosides, sodium chloride flush   Vital Signs    Vitals:   04/11/18 0311 04/11/18 0400 04/11/18 0530 04/11/18 0600  BP:  (!) 130/49 (!) 129/48 112/75  Pulse:  60 60 (!) 125  Resp:  18 18 16   Temp:  100 F (37.8 C)    TempSrc:  Oral    SpO2:  98% 98% 98%  Weight: 286 lb 6 oz (129.9 kg)     Height:        Intake/Output Summary (Last 24 hours) at 04/11/2018 0816 Last data filed at 04/11/2018 0600 Gross per 24 hour  Intake 3875.68 ml  Output 2565 ml  Net 1310.68 ml   Filed Weights   04/09/18 1427 04/10/18 0500 04/11/18 0311  Weight: 268 lb 11.9 oz (121.9 kg) 278 lb 14.1 oz (126.5 kg) 286 lb 6 oz (129.9 kg)    Telemetry    Afib with RVR, ventricular rates from the low 100s to 130s bpm - Personally Reviewed  ECG    n/a - Personally Reviewed  Physical Exam   GEN: Critically ill apperaring.   Neck: JVD difficult to assess. Cardiac: Tachycardic, irregularly irregular, no murmurs, rubs, or gallops.  Respiratory: Diminished breath sounds bilaterally. Intubated.  GI: Soft, nontender, non-distended.   MS: 1+ bilateral pre-tibial edema; No deformity. Neuro:  Intubated and sedated.  Psych: Intubated and sedated.  Labs    Chemistry Recent Labs  Lab 04/04/18 2027 04/06/18 0509  04/09/18 0248 04/10/18 0442 04/11/18 0312  NA 143 143   < > 152* 153* 149*  K 4.2 6.7*   < > 5.1 4.0 3.5  CL 109 114*   < > 123* 120* 116*  CO2 25 22   < > 17* 28 29  GLUCOSE 102* 244*   < > 184* 179* 310*  BUN 22 73*   < > 73* 64* 56*  CREATININE 1.53* 2.90*   < > 1.96* 1.80* 1.54*  CALCIUM 9.6 7.6*   < > 8.4* 8.1* 7.7*  PROT 7.4 5.3*  --  5.8*  --   --   ALBUMIN 4.0 2.9*  --  2.9*  --   --   AST 39 27  --  42*  --   --   ALT 19 19  --  28  --   --   ALKPHOS 92 70  --  72  --   --   BILITOT 1.4* 0.7  --  1.0  --   --   GFRNONAA 32* 15*   < > 23* 26* 31*  GFRAA 37* 17*   < > 27* 30* 36*  ANIONGAP 9 7   < > 12 5 4*   < > = values in this interval not displayed.     Hematology Recent  Labs  Lab 04/09/18 0251 04/10/18 0442 04/11/18 0312  WBC 8.6 11.3* 10.4  RBC 2.91* 2.65* 2.57*  HGB 9.4* 8.6* 8.4*  HCT 29.2* 26.0* 25.0*  MCV 100.3* 98.2 97.4  MCH 32.5 32.4 32.6  MCHC 32.4 33.0 33.5  RDW 16.4* 16.2* 15.9*  PLT 166 176 177    Cardiac Enzymes Recent Labs  Lab 04/04/18 2027 04/09/18 0251 04/09/18 0951 04/09/18 1458  TROPONINI <0.03 0.08* 0.10* 0.09*   No results for input(s): TROPIPOC in the last 168 hours.   BNP Recent Labs  Lab 04/10/18 0442  BNP 1,164.0*     DDimer No results for input(s): DDIMER in the last 168 hours.   Radiology    Portable Chest Xray  Result Date: 04/10/2018 IMPRESSION: Overall stable appearance of the chest when compare with the previous day. Electronically Signed   By: Inez Catalina M.D.   On: 04/10/2018 07:04    Cardiac Studies   Echo 04/10/2018: Study Conclusions  - Left ventricle: The cavity size was normal. Wall thickness was   normal. Systolic function was normal. The estimated ejection   fraction was in the range of 60% to 65%. - Mitral valve: Moderately thickened, mildly calcified leaflets . - Left atrium: The atrium was mildly dilated. - Right ventricle: The cavity size was mildly to moderately   dilated. Wall thickness was mildly increased. - Right atrium: The atrium was mildly dilated. - Tricuspid valve: There was moderate regurgitation. - Pulmonary arteries: Systolic pressure was moderately increased,   estimated to be 50 mm Hg plus central venous/right atrial   pressure.  Patient Profile     77 y.o. 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  Assessment & Plan    1. PAF with RVR: -Remains in Afib with RVR with ventricular rates in the 110s to 120s bpm currently -Continue IV amiodarone for rate control -Unable to use PO at this time given no BM -BP slightly soft in the mid 85I  systolic, which will preclude use of IV Lopressor for added rate control prn -Would ideally like to avoid digoxin given her AKI, age, and acute illness -Has not been on anticoagulation/heparin  given anemia with upper GI bleed, resume when/if able -Consider changing albuterol nebs to Xopenex  2.  Acute respiratory failure with hypoxia and hypercapnia: -In the setting of underlying severe COPD, pulmonary hypertension, acute on chronic diastolic CHF, morbid obesity, and possible OHS with possible aspiration pneumonia -Mechanical ventilation per PCCM -Consider PE as well, however CTA chest has been deferred given her AKI -Wean phenylephrine as able per PCCM -Recommend gentle IV diuresis, as BP allows, as she is volume up on exam  3.  Partial SBO: -NG tube decompression -Currently no plans for surgical intervention -Advance p.o. medications when able -Still no BM  4.  Acute blood loss anemia/upper GI bleed: -Hemoglobin low though stable -GI has evaluated the patient with no plans for endoscopy at this time -Per IM   For questions or updates, please contact [place] Please consult [email or web address] for contact info under Cardiology/STEMI.    Signed, Christell Faith, PA-C [place] Pager: [phone] 04/11/2018, 8:16 AM   Attending Note Patient seen and examined, agree with detailed note above,  Patient presentation and plan discussed on rounds.   intubated sedated On neo for blood pressure support Continues on amiodarone infusion Review of telemetry shows paroxysmal atrial fibrillation this morning converting back to normal sinus rhythm. Telemetry currently showing normal sinus rhythm  On exam, FiO2 40% Arousable but very lethargic, unable to estimate JVD Coarse breath sounds bilaterally,  heart sounds regular normal S1-S2 no murmurs appreciated Abdomen soft, nondistended, no significant lower extremity edema  Lab work reviewed showing potassium 3.5 creatinine 1.54  BUN 56 hematocrit 25  A/P: Small bowel obstruction NG tube for decompression Medical management at this time  Paroxysmal atrial fibrillation Secondary to bowel obstruction as detailed above Would continue amiodarone infusion Attempt to wean off amiodarone will likely be unsuccessful until she is taking oral amiodarone pill. Would not start any oral pills until she is eating full meals  Cute on chronic respiratory distress Multifactorial  severe COPD, pulmonary hypertension, acute on chronic diastolic CHF, morbid obesity,Anemia,  and possible OHS with possible aspiration pneumonia Chest x-ray with bilateral pleural effusions venous congestion --would consider continued gentle diuresis with close monitoring of renal function --consider packed red blood cells  Discussed with husband at her bedside, all questions answered Greater than 50% was spent in counseling and coordination of care with patient Total encounter time 35 minutes or more   Signed: Esmond Plants  M.D., Ph.D. [facility]

## 2018-04-12 ENCOUNTER — Encounter: Payer: Self-pay | Admitting: Registered Nurse

## 2018-04-12 ENCOUNTER — Encounter: Payer: Self-pay | Admitting: Gastroenterology

## 2018-04-12 ENCOUNTER — Inpatient Hospital Stay: Payer: Medicare Other

## 2018-04-12 ENCOUNTER — Encounter
Admission: EM | Disposition: A | Discharge status: Skilled Nursing Facility | Payer: Self-pay | Source: Home / Self Care | Attending: Specialist

## 2018-04-12 DIAGNOSIS — K921 Melena: Secondary | ICD-10-CM

## 2018-04-12 DIAGNOSIS — I483 Typical atrial flutter: Secondary | ICD-10-CM

## 2018-04-12 HISTORY — PX: ESOPHAGOGASTRODUODENOSCOPY (EGD) WITH PROPOFOL: SHX5813

## 2018-04-12 LAB — BPAM RBC
Blood Product Expiration Date: 201908302359
ISSUE DATE / TIME: 201907312152
Unit Type and Rh: 6200

## 2018-04-12 LAB — GLUCOSE, CAPILLARY
Glucose-Capillary: 111 mg/dL — ABNORMAL HIGH (ref 70–99)
Glucose-Capillary: 111 mg/dL — ABNORMAL HIGH (ref 70–99)
Glucose-Capillary: 114 mg/dL — ABNORMAL HIGH (ref 70–99)
Glucose-Capillary: 127 mg/dL — ABNORMAL HIGH (ref 70–99)
Glucose-Capillary: 175 mg/dL — ABNORMAL HIGH (ref 70–99)
Glucose-Capillary: 85 mg/dL (ref 70–99)
Glucose-Capillary: 94 mg/dL (ref 70–99)
Glucose-Capillary: 95 mg/dL (ref 70–99)
Glucose-Capillary: 99 mg/dL (ref 70–99)

## 2018-04-12 LAB — BLOOD GAS, ARTERIAL
Acid-Base Excess: 0 mmol/L (ref 0.0–2.0)
BICARBONATE: 24.8 mmol/L (ref 20.0–28.0)
FIO2: 0.35
LHR: 18 {breaths}/min
MECHVT: 500 mL
O2 Saturation: 98.8 %
PEEP/CPAP: 5 cmH2O
Patient temperature: 37
pCO2 arterial: 40 mmHg (ref 32.0–48.0)
pH, Arterial: 7.4 (ref 7.350–7.450)
pO2, Arterial: 123 mmHg — ABNORMAL HIGH (ref 83.0–108.0)

## 2018-04-12 LAB — BASIC METABOLIC PANEL
ANION GAP: 8 (ref 5–15)
BUN: 54 mg/dL — ABNORMAL HIGH (ref 8–23)
CO2: 25 mmol/L (ref 22–32)
CREATININE: 1.62 mg/dL — AB (ref 0.44–1.00)
Calcium: 7.3 mg/dL — ABNORMAL LOW (ref 8.9–10.3)
Chloride: 113 mmol/L — ABNORMAL HIGH (ref 98–111)
GFR, EST AFRICAN AMERICAN: 34 mL/min — AB (ref >60)
GFR, EST NON AFRICAN AMERICAN: 30 mL/min — AB (ref >60)
Glucose, Bld: 233 mg/dL — ABNORMAL HIGH (ref 70–99)
Potassium: 3.7 mmol/L (ref 3.5–5.1)
SODIUM: 146 mmol/L — AB (ref 135–145)

## 2018-04-12 LAB — C DIFFICILE QUICK SCREEN W PCR REFLEX
C Diff antigen: NEGATIVE
C Diff interpretation: NOT DETECTED
C Diff toxin: NEGATIVE

## 2018-04-12 LAB — CBC WITH DIFFERENTIAL/PLATELET
BAND NEUTROPHILS: 0 %
BASOS ABS: 0.2 10*3/uL — AB (ref 0–0.1)
BLASTS: 0 %
Basophils Relative: 2 %
Eosinophils Absolute: 0.2 10*3/uL (ref 0–0.7)
Eosinophils Relative: 2 %
HCT: 26.7 % — ABNORMAL LOW (ref 35.0–47.0)
HEMOGLOBIN: 8.9 g/dL — AB (ref 12.0–16.0)
Lymphocytes Relative: 10 %
Lymphs Abs: 0.9 10*3/uL — ABNORMAL LOW (ref 1.0–3.6)
MCH: 32.5 pg (ref 26.0–34.0)
MCHC: 33.1 g/dL (ref 32.0–36.0)
MCV: 98 fL (ref 80.0–100.0)
METAMYELOCYTES PCT: 0 %
Monocytes Absolute: 1.2 10*3/uL — ABNORMAL HIGH (ref 0.2–0.9)
Monocytes Relative: 14 %
Myelocytes: 0 %
Neutro Abs: 6 10*3/uL (ref 1.4–6.5)
Neutrophils Relative %: 72 %
Other: 0 %
PROMYELOCYTES RELATIVE: 0 %
Platelets: 142 10*3/uL — ABNORMAL LOW (ref 150–440)
RBC: 2.73 MIL/uL — ABNORMAL LOW (ref 3.80–5.20)
RDW: 16.5 % — ABNORMAL HIGH (ref 11.5–14.5)
Smear Review: ADEQUATE
WBC: 8.5 10*3/uL (ref 3.6–11.0)
nRBC: 4 /100 WBC — ABNORMAL HIGH

## 2018-04-12 LAB — PATHOLOGIST SMEAR REVIEW

## 2018-04-12 LAB — TYPE AND SCREEN
ABO/RH(D): A POS
Antibody Screen: NEGATIVE
Unit division: 0

## 2018-04-12 LAB — MAGNESIUM: MAGNESIUM: 2.1 mg/dL (ref 1.7–2.4)

## 2018-04-12 LAB — HEMOGLOBIN AND HEMATOCRIT, BLOOD
HCT: 28.7 % — ABNORMAL LOW (ref 35.0–47.0)
Hemoglobin: 9.8 g/dL — ABNORMAL LOW (ref 12.0–16.0)

## 2018-04-12 SURGERY — ESOPHAGOGASTRODUODENOSCOPY (EGD) WITH PROPOFOL
Anesthesia: General

## 2018-04-12 MED ORDER — DEXTROSE 50 % IV SOLN
25.0000 mL | Freq: Once | INTRAVENOUS | Status: AC
  Administered 2018-04-12: 25 mL via INTRAVENOUS

## 2018-04-12 MED ORDER — PANTOPRAZOLE SODIUM 40 MG IV SOLR: 40.0000 mg | Freq: Two times a day (BID) | INTRAVENOUS | Status: DC

## 2018-04-12 MED ORDER — FENTANYL CITRATE (PF) 100 MCG/2ML IJ SOLN
50.0000 ug | INTRAMUSCULAR | Status: DC | PRN
  Administered 2018-04-12 – 2018-04-13 (×3): 100 ug via INTRAVENOUS
  Filled 2018-04-12 (×2): qty 2

## 2018-04-12 MED ORDER — PROPOFOL 1000 MG/100ML IV EMUL
5.0000 ug/kg/min | INTRAVENOUS | Status: DC
  Administered 2018-04-12: 20 ug/kg/min via INTRAVENOUS
  Filled 2018-04-12: qty 100

## 2018-04-12 MED ORDER — CEFAZOLIN SODIUM-DEXTROSE 1-4 GM/50ML-% IV SOLN
1.0000 g | Freq: Three times a day (TID) | INTRAVENOUS | Status: DC
  Administered 2018-04-12 – 2018-04-18 (×18): 1 g via INTRAVENOUS
  Filled 2018-04-12 (×20): qty 50

## 2018-04-12 MED ORDER — SODIUM CHLORIDE 0.9 % IV SOLN
80.0000 mg | Freq: Once | INTRAVENOUS | Status: AC
  Administered 2018-04-12: 80 mg via INTRAVENOUS
  Filled 2018-04-12: qty 80

## 2018-04-12 MED ORDER — SODIUM CHLORIDE 0.9 % IV SOLN
8.0000 mg/h | INTRAVENOUS | Status: AC
  Administered 2018-04-12 – 2018-04-14 (×7): 8 mg/h via INTRAVENOUS
  Filled 2018-04-12 (×10): qty 80

## 2018-04-12 MED ORDER — FUROSEMIDE 10 MG/ML IJ SOLN
40.0000 mg | Freq: Once | INTRAMUSCULAR | Status: AC
  Administered 2018-04-12: 40 mg via INTRAVENOUS
  Filled 2018-04-12: qty 4

## 2018-04-12 MED ORDER — EPINEPHRINE PF 1 MG/ML IJ SOLN
INTRAMUSCULAR | Status: DC | PRN
  Administered 2018-04-12: 4 mL via SUBCUTANEOUS

## 2018-04-12 MED ORDER — MIDAZOLAM HCL 2 MG/2ML IJ SOLN
2.0000 mg | INTRAMUSCULAR | Status: DC | PRN
  Administered 2018-04-13 (×2): 2 mg via INTRAVENOUS
  Filled 2018-04-12: qty 2

## 2018-04-12 MED ORDER — DEXTROSE 50 % IV SOLN
INTRAVENOUS | Status: AC
  Filled 2018-04-12: qty 50

## 2018-04-12 MED ORDER — SODIUM CHLORIDE 0.9 % IV BOLUS
1000.0000 mL | Freq: Once | INTRAVENOUS | Status: AC
Start: 2018-04-12 — End: 2018-04-12
  Administered 2018-04-12: 1000 mL via INTRAVENOUS

## 2018-04-12 MED ORDER — FENTANYL CITRATE (PF) 100 MCG/2ML IJ SOLN
INTRAMUSCULAR | Status: AC
  Administered 2018-04-12: 100 ug via INTRAVENOUS
  Filled 2018-04-12: qty 2

## 2018-04-12 NOTE — Progress Notes (Signed)
Deborah Obrien , MD [address], [place], [place], [place], [address] [address], [place], [place], [place], [address] Phone: [phone]  Fax: [phone]   Deborah Obrien is being followed for GI bleed    Subjective: Further melena and drop in HB last night , patient does not give any histiory , intubated    Objective: Vital signs in last 24 hours: Vitals:   04/12/18 0500 04/12/18 0600 04/12/18 0700 04/12/18 0745  BP: 106/79 (!) 102/47 (!) 115/49   Pulse: 98 98 60   Resp: _0 Temp:    98.1 F (36.7 C)  TempSrc:    Oral  SpO2: 100% 100%    Weight:      Height:       Weight change: 2 lb 13.9 oz (1.3 kg)  Intake/Output Summary (Last 24 hours) at 04/12/2018 0800 Last data filed at 04/12/2018 0600 Gross per 24 hour  Intake 3980.6 ml  Output 955 ml  Net 3025.6 ml     Exam: Heart:: Regular rate and rhythm, S1S2 present or without murmur or extra heart sounds Lungs: normal, clear to auscultation and clear to auscultation and percussion Abdomen: soft, nontender, normal bowel sounds   Lab Results: _1 @ Micro Results: Recent Results (from the past 240 hour(s))  MRSA PCR Screening     Status: None   Collection Time: 04/06/18  9:05 AM  Result Value Ref Range Status   MRSA by PCR NEGATIVE NEGATIVE Final    Comment:        The GeneXpert MRSA Assay (FDA approved for NASAL specimens only), is one component of a comprehensive MRSA colonization surveillance program. It is not intended to diagnose MRSA infection nor to guide or monitor treatment for MRSA infections. Performed at [facility], [address] [place], [place] [address]   Culture, respiratory (non-expectorated)     Status: None   Collection Time: 04/09/18  7:43 AM  Result Value Ref Range Status   Specimen Description   Final    TRACHEAL ASPIRATE Performed at [facility], [address], [place], [place] [address]    Special Requests   Final     NONE Performed at [facility], [place], [place] [address]    Gram Stain   Final    FEW WBC PRESENT,BOTH PMN AND MONONUCLEAR MODERATE GRAM POSITIVE COCCI FEW GRAM NEGATIVE RODS Performed at [facility], [place] [address] [place], [place] [address]    Culture MODERATE KLEBSIELLA PNEUMONIAE  Final   Report Status 04/11/2018 FINAL  Final   Organism ID, Bacteria KLEBSIELLA PNEUMONIAE  Final      Susceptibility   Klebsiella pneumoniae - MIC*    AMPICILLIN RESISTANT Resistant     CEFAZOLIN <=4 SENSITIVE Sensitive     CEFEPIME <=1 SENSITIVE Sensitive     CEFTAZIDIME <=1 SENSITIVE Sensitive     CEFTRIAXONE <=1 SENSITIVE Sensitive     CIPROFLOXACIN <=0.25 SENSITIVE Sensitive     GENTAMICIN <=1 SENSITIVE Sensitive     IMIPENEM <=0.25 SENSITIVE Sensitive     TRIMETH/SULFA <=20 SENSITIVE Sensitive     AMPICILLIN/SULBACTAM 8 SENSITIVE Sensitive     PIP/TAZO <=4 SENSITIVE Sensitive     Extended ESBL NEGATIVE Sensitive     * MODERATE KLEBSIELLA PNEUMONIAE  C difficile quick scan w PCR reflex     Status: None   Collection Time: 04/12/18  3:12 AM  Result Value Ref Range Status   C Diff  antigen NEGATIVE NEGATIVE Final   C Diff toxin NEGATIVE NEGATIVE Final   C Diff interpretation No C. difficile detected.  Final    Comment: Performed at [facility], [address] [place], [place] [address]   Studies/Results: Dg Chest Port 1 View  Result Date: 04/11/2018 CLINICAL DATA:  Ventilated patient EXAM: PORTABLE CHEST 1 VIEW COMPARISON:  04/10/18 FINDINGS: Endotracheal tube and feeding tube unchanged. PICC line unchanged. Stable cardiac silhouette. Bilateral pleural effusions. Central venous congestion. IMPRESSION: 1. Stable support apparatus. 2. Bilateral pleural effusions and central venous congestion. 3. No interval change. Electronically Signed   By: Suzy Bouchard M.D.   On: 04/11/2018 10:22   Medications: I have reviewed the patient's current  medications. Scheduled Meds: . chlorhexidine gluconate (MEDLINE KIT)  15 mL Mouth Rinse BID  . docusate  100 mg Oral BID  . feeding supplement (PRO-STAT SUGAR FREE 64)  30 mL Per Tube TID  . feeding supplement (VITAL HIGH PROTEIN)  1,000 mL Per Tube Q24H  . free water  200 mL Per Tube Q3H  . insulin aspart  0-15 Units Subcutaneous Q4H  . insulin aspart  5 Units Subcutaneous Q4H  . insulin detemir  20 Units Subcutaneous BID  . ipratropium-albuterol  3 mL Nebulization Q6H  . mouth rinse  15 mL Mouth Rinse 10 times per day  . multivitamin  15 mL Per Tube Daily  . [START ON 04/15/2018] pantoprazole  40 mg Intravenous Q12H  . sodium chloride flush  10-40 mL Intracatheter Q12H   Continuous Infusions: . amiodarone 60 mg/hr (04/12/18 0600)  . ampicillin-sulbactam (UNASYN) IV Stopped (04/12/18 0350)  . dexmedetomidine (PRECEDEX) IV infusion 0.2 mcg/kg/hr (04/12/18 0545)  . fentaNYL infusion INTRAVENOUS Stopped (04/11/18 0900)  . pantoprozole (PROTONIX) infusion 8 mg/hr (04/12/18 0600)  . phenylephrine (NEO-SYNEPHRINE) Adult infusion 30.133 mcg/min (04/12/18 0600)   PRN Meds:.acetaminophen **OR** acetaminophen, bisacodyl, fentaNYL, metoprolol tartrate, midazolam, ondansetron **OR** ondansetron (ZOFRAN) IV, phenol, sennosides, sodium chloride flush   Assessment: Principal Problem:   Intestinal obstruction (HCC) Active Problems:   PAF (paroxysmal atrial fibrillation) (HCC)   Body mass index (BMI) of 40.0-44.9 in adult [facility])   Acute on chronic renal insufficiency   Diabetic neuropathy with neurologic complication (HCC)   Sinus node dysfunction (HCC)   Atrial fibrillation with RVR (HCC)   Hematemesis   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 female seen by Dr Allen Norris on 04/05/18 for drop in HB and maroon aspirate from NG tube . She had been on Eloquis. Felt to be due to Eloquis. . Plan was to perform EGD after eloquis washed out.  I was called this morning as she had further melena and drop in HB. Patient intubated. Discussed with family and ICU attending    Plan: 1. EGD today  2. PPI IV   I have discussed alternative options, risks & benefits,  which include, but are not limited to, bleeding, infection, perforation,respiratory complication & drug reaction.  The patient agrees with this plan & written consent will be obtained.  LOS: 7 days   Deborah Bellows, MD 04/12/2018, 8:00 AM

## 2018-04-12 NOTE — Progress Notes (Signed)
Pharmacy Antibiotic Note  Deborah Obrien is a 77 y.o. female admitted on 04/04/2018 with pneumonia.  Pharmacy has been consulted for cefazolin dosing. Patient previously received Unasyn and Vancomycin.  Plan: Cefazolin 1g IV Q8hr.   Height: 5\' 6"  (167.6 cm) Weight: 289 lb 3.9 oz (131.2 kg) IBW/kg (Calculated) : 59.3  Temp (24hrs), Avg:98.5 F (36.9 C), Min:98.1 F (36.7 C), Max:99 F (37.2 C)  Recent Labs  Lab 04/08/18 0543 04/09/18 0248 04/09/18 0251 04/09/18 0403 04/09/18 0546 04/10/18 0442 04/11/18 0312 04/11/18 1848 04/12/18 0129  WBC 6.1  --  8.6  --   --  11.3* 10.4 7.5 8.5  CREATININE 1.86* 1.96*  --   --   --  1.80* 1.54*  --  1.62*  LATICACIDVEN  --   --   --  1.9 1.4  --   --   --   --     Estimated Creatinine Clearance: 40.4 mL/min (A) (by C-G formula based on SCr of 1.62 mg/dL (H)).    Allergies  Allergen Reactions  . Cephalosporins Other (See Comments)    Reaction:  Unknown   . Macrolides And Ketolides Other (See Comments)    Reaction:  Unknown   . Meperidine Shortness Of Breath, Nausea Only and Other (See Comments)    Reaction:  Stomach pain   . Prednisone Anaphylaxis and Other (See Comments)    Reaction:  Increases pts blood sugar  Pt states that she is allergic to all steroids.    . Shellfish Allergy Anaphylaxis, Shortness Of Breath, Diarrhea, Nausea Only and Rash    Reaction:  Stomach pain   . Sulfa Antibiotics Shortness Of Breath, Diarrhea, Nausea Only and Other (See Comments)    Reaction:  Stomach pain   . Uloric [Febuxostat] Anaphylaxis    Locks pt's body up   . Aspirin Other (See Comments)    Reaction:  Stomach pain   . Celecoxib Other (See Comments)    Reaction:  GI bleed, weakness, and stomach pain.    . Cephalexin Diarrhea, Nausea Only and Other (See Comments)    Reaction:  Stomach pain   . Erythromycin Diarrhea, Nausea Only and Other (See Comments)    Reaction:  Stomach pain   . Hydromorphone Other (See Comments)    Reaction:   Hypotension   . Iodinated Diagnostic Agents Rash and Other (See Comments)    Pt states that she is unable to have because she has chronic kidney disease.    Marland Kitchen Oxycodone Other (See Comments)    Reaction:  Stomach pain   . Atorvastatin Nausea Only and Other (See Comments)    Reaction:  Weakness   . Codeine Diarrhea, Nausea Only and Other (See Comments)    Reaction:  Stomach pain Pt tolerates morphine   . Doxycycline     Stomach pain   . Tape Other (See Comments)    Reaction:  Causes pts skin to tear  Pt states that she is able to use paper tape.    . Valdecoxib Swelling and Other (See Comments)    Pt states that her hands and feet swell.    . Iodine Rash    Antimicrobials this admission: Zosyn 7/29 x 1  Unasyn 7/29 >> 8/1 Vancomycin 7/29 >> 7/31  Dose adjustments this admission: N/A  Microbiology results: 7/29 Sputum: klebsiella 7/26  MRSA PCR: negative   Thank you for allowing pharmacy to be a part of this patient's care.  Simpson,Michael L 04/12/2018 7:54 PM

## 2018-04-12 NOTE — Progress Notes (Signed)
Patient ID: Deborah Obrien, female   DOB: 1940-12-27, 77 y.o.   MRN: [id number]     [facility] Day(s): 7.   Post op day(s): Day of Surgery.   Interval History: Patient seen and examined. Patient sedated and on mechanical ventilation. Report taken by nurse.   Vital signs in last 24 hours: [min-max] current  Temp:  [98.1 F (36.7 C)-99 F (37.2 C)] 98.3 F (36.8 C) (08/01 1721) Pulse Rate:  [59-105] 61 (08/01 1800) Resp:  [18-19] 18 (08/01 1800) BP: (100-142)/(38-79) 138/68 (08/01 1800) SpO2:  [94 %-100 %] 100 % (08/01 1539) FiO2 (%):  [30 %-35 %] 30 % (08/01 1539) Weight:  [131.2 kg (289 lb 3.9 oz)] 131.2 kg (289 lb 3.9 oz) (08/01 0448)     Height: 5\' 6"  (167.6 cm) Weight: 131.2 kg (289 lb 3.9 oz) BMI (Calculated): 46.71   Physical Exam:  Constitutional: sedated, no distress.   Gastrointestinal: soft, non-tender, and non-distended  Labs:  CBC Latest Ref Rng & Units 04/12/2018 04/12/2018 04/11/2018  WBC 3.6 - 11.0 K/uL - 8.5 7.5  Hemoglobin 12.0 - 16.0 g/dL 9.8(L) 8.9(L) 7.0(L)  Hematocrit 35.0 - 47.0 % 28.7(L) 26.7(L) 21.5(L)  Platelets 150 - 440 K/uL - 142(L) 131(L)   CMP Latest Ref Rng & Units 04/12/2018 04/11/2018 04/10/2018  Glucose 70 - 99 mg/dL 233(H) 310(H) 179(H)  BUN 8 - 23 mg/dL 54(H) 56(H) 64(H)  Creatinine 0.44 - 1.00 mg/dL 1.62(H) 1.54(H) 1.80(H)  Sodium 135 - 145 mmol/L 146(H) 149(H) 153(H)  Potassium 3.5 - 5.1 mmol/L 3.7 3.5 4.0  Chloride 98 - 111 mmol/L 113(H) 116(H) 120(H)  CO2 22 - 32 mmol/L 25 29 28   Calcium 8.9 - 10.3 mg/dL 7.3(L) 7.7(L) 8.1(L)  Total Protein 6.5 - 8.1 g/dL - - -  Total Bilirubin 0.3 - 1.2 mg/dL - - -  Alkaline Phos 38 - 126 U/L - - -  AST 15 - 41 U/L - - -  ALT 0 - 44 U/L - - -    Imaging studies: No new pertinent imaging studies  Assessment/Plan:  77 y.o.femalewith small bowel obstruction and upper GI bleeding, complicated by pertinent comorbidities includingatrial fibrillation on anticoagulation, CHF, sinus  node dysfunction with pacemaker, chronic kidney disease, morbid obesity, history of bleeding gastric polyps.  Patientcontinue critically ill, on vasopressors, respiratory failure on mechanical ventilation. Regarding her abdominal small bowel obstruction, patient has had multiple episodes of bowel movements, melena. Agree and appreciated GI re evaluation. Patient has history of those polyps that use to bleed in the past. Patient tolerating enteral diet at goal and having bowel movement. GI taken care of the upper GI bleeding. As the small bowel is resolved, will sign off. Re consult if necessary.   Arnold Long, MD

## 2018-04-12 NOTE — Op Note (Signed)
[facility] Patient Name: Deborah Obrien Procedure Date: 04/12/2018 11:45 AM MRN: [id number] Account #: [id number] Date of Birth: [birth year] Admit Type: Inpatient Age: 77 Room: [facility] ENDO ROOM 4 Gender: Female Note Status: Finalized Procedure:            Upper GI endoscopy Indications:          Melena Providers:            Jonathon Bellows MD, MD Medicines:            Monitored Anesthesia Care Complications:        No immediate complications. Procedure:            Pre-Anesthesia Assessment:                       - Prior to the procedure, a History and Physical was                        performed, and patient medications, allergies and                        sensitivities were reviewed. The patient's tolerance of                        previous anesthesia was reviewed.                       - The risks and benefits of the procedure and the                        sedation options and risks were discussed with the                        patient. All questions were answered and informed                        consent was obtained.                       - ASA Grade Assessment: IV - A patient with severe                        systemic disease that is a constant threat to life.                       After obtaining informed consent, the endoscope was                        passed under direct vision. Throughout the procedure,                        the patient's blood pressure, pulse, and oxygen                        saturations were monitored continuously. The Endoscope                        was introduced through the mouth, and advanced to the                        third part of duodenum. The upper  GI endoscopy was                        accomplished without difficulty. The patient tolerated                        the procedure well. Findings:      The examined duodenum was normal.      The esophagus was normal.      A single large semi-sessile polyp  with bleeding and stigmata of recent       bleeding was found in the gastric body. Area was successfully injected       with 4 mL of a 1:10,000 solution of epinephrine for hemostasis. I also       applied hemospray over thye polyp and surrounding areas for hemostasis       with good results      Multiple medium sessile polyps with no bleeding and no stigmata of       recent bleeding were found in the gastric body. Impression:           - Normal examined duodenum.                       - Normal esophagus.                       - A single gastric polyp. Injected.                       - Multiple gastric polyps.                       - No specimens collected. Recommendation:       - - Observe patient in icu                       - 1. Continue PPI                       2. When she is more stable as an outpatient needs                        resection of large gastric polyp                       3. If has further bleeding may have to resect the polyp                        sooner. I expect the manoevers today would provide                        hemostatsis Procedure Code(s):    --- Professional ---                       [phone], Esophagogastroduodenoscopy, flexible, transoral;                        with control of bleeding, any method Diagnosis Code(s):    --- Professional ---                       K31.7, Polyp of stomach and duodenum  K92.1, Melena (includes Hematochezia) CPT copyright 2017 [employer]. All rights reserved. The codes documented in this report are preliminary and upon coder review may  be revised to meet current compliance requirements. Jonathon Bellows, MD Jonathon Bellows MD, MD 04/12/2018 12:23:45 PM This report has been signed electronically. Number of Addenda: 0 Note Initiated On: 04/12/2018 11:45 AM      [facility]

## 2018-04-12 NOTE — Progress Notes (Signed)
Pt agitated, when asked if she was comfortable she shook her head no. Pt repositioned in bed and precedex turned up to 1 mcg. Pt now resting comfortably, will continue to monitor.

## 2018-04-12 NOTE — Progress Notes (Addendum)
Progress Note  Patient Name: Deborah Obrien Date of Encounter: 04/12/2018  Primary Cardiologist: [place] intubated and sedated. Paced this morning. Remains on IV amiodarone. She had 2 large, bloody BMs overnight with a drop in her HGB to 7, now s/p 1 unit of pRBC with repeat HGB of 8.9. She was given a dose of IV Lasix for volume overload. GI is planning EGD this morning. Magnesium repleted to goal > 2.0. SCr 1.80-->1.54-->1.62. K+ 3.5-->3.7. She remains net + 19.4 L for the admission. Documented UOP of 1.2 L for the past 24 hours.   Inpatient Medications    Scheduled Meds: . chlorhexidine gluconate (MEDLINE KIT)  15 mL Mouth Rinse BID  . docusate  100 mg Oral BID  . feeding supplement (PRO-STAT SUGAR FREE 64)  30 mL Per Tube TID  . feeding supplement (VITAL HIGH PROTEIN)  1,000 mL Per Tube Q24H  . free water  200 mL Per Tube Q3H  . insulin aspart  0-15 Units Subcutaneous Q4H  . insulin aspart  5 Units Subcutaneous Q4H  . insulin detemir  20 Units Subcutaneous BID  . ipratropium-albuterol  3 mL Nebulization Q6H  . mouth rinse  15 mL Mouth Rinse 10 times per day  . multivitamin  15 mL Per Tube Daily  . [START ON 04/15/2018] pantoprazole  40 mg Intravenous Q12H  . sodium chloride flush  10-40 mL Intracatheter Q12H   Continuous Infusions: . amiodarone 60 mg/hr (04/12/18 0600)  . ampicillin-sulbactam (UNASYN) IV Stopped (04/12/18 0350)  . dexmedetomidine (PRECEDEX) IV infusion 0.2 mcg/kg/hr (04/12/18 0545)  . fentaNYL infusion INTRAVENOUS Stopped (04/11/18 0900)  . pantoprozole (PROTONIX) infusion 8 mg/hr (04/12/18 0600)  . phenylephrine (NEO-SYNEPHRINE) Adult infusion 30.133 mcg/min (04/12/18 0600)   PRN Meds: acetaminophen **OR** acetaminophen, bisacodyl, fentaNYL, metoprolol tartrate, midazolam, ondansetron **OR** ondansetron (ZOFRAN) IV, phenol, sennosides, sodium chloride flush   Vital Signs    Vitals:   04/12/18 0759 04/12/18 0800 04/12/18 0801  04/12/18 0900  BP:  (!) 124/53  (!) 116/52  Pulse:  (!) 59  60  Resp:  18  18  Temp:      TempSrc:      SpO2: 100% 100% 100%   Weight:      Height:        Intake/Output Summary (Last 24 hours) at 04/12/2018 0933 Last data filed at 04/12/2018 0600 Gross per 24 hour  Intake 3980.6 ml  Output 955 ml  Net 3025.6 ml   Filed Weights   04/10/18 0500 04/11/18 0311 04/12/18 0448  Weight: 278 lb 14.1 oz (126.5 kg) 286 lb 6 oz (129.9 kg) 289 lb 3.9 oz (131.2 kg)    Telemetry    Paced with intermittent atrial flutter, ventricular rates in the 60s to 80s bpm - Personally Reviewed  ECG    n/a - Personally Reviewed  Physical Exam   GEN: Critically ill appearing.   Neck: JVD difficult to assess. Cardiac: Irregular, no murmurs, rubs, or gallops.  Respiratory: Diminished breath sounds bilaterally.  GI: Soft, nontender, non-distended.   MS: 1+ bilateral pretibial edema; No deformity. Neuro:  Intubated and sedated.  Psych: Intubated and sedated.  Labs    Chemistry Recent Labs  Lab 04/06/18 [phone]  04/09/18 0248 04/10/18 0442 04/11/18 0312 04/12/18 0129  NA 143   < > 152* 153* 149* 146*  K 6.7*   < > 5.1 4.0 3.5 3.7  CL 114*   < > 123* 120* 116* 113*  CO2 22   < > 17* 28 29 25   GLUCOSE 244*   < > 184* 179* 310* 233*  BUN 73*   < > 73* 64* 56* 54*  CREATININE 2.90*   < > 1.96* 1.80* 1.54* 1.62*  CALCIUM 7.6*   < > 8.4* 8.1* 7.7* 7.3*  PROT 5.3*  --  5.8*  --   --   --   ALBUMIN 2.9*  --  2.9*  --   --   --   AST 27  --  42*  --   --   --   ALT 19  --  28  --   --   --   ALKPHOS 70  --  72  --   --   --   BILITOT 0.7  --  1.0  --   --   --   GFRNONAA 15*   < > 23* 26* 31* 30*  GFRAA 17*   < > 27* 30* 36* 34*  ANIONGAP 7   < > 12 5 4* 8   < > = values in this interval not displayed.     Hematology Recent Labs  Lab 04/11/18 0312 04/11/18 1848 04/12/18 0129  WBC 10.4 7.5 8.5  RBC 2.57* 2.13* 2.73*  HGB 8.4* 7.0* 8.9*  HCT 25.0* 21.5* 26.7*  MCV 97.4 100.6* 98.0  MCH  32.6 32.7 32.5  MCHC 33.5 32.5 33.1  RDW 15.9* 16.2* 16.5*  PLT 177 131* 142*    Cardiac Enzymes Recent Labs  Lab 04/09/18 0251 04/09/18 0951 04/09/18 1458  TROPONINI 0.08* 0.10* 0.09*   No results for input(s): TROPIPOC in the last 168 hours.   BNP Recent Labs  Lab 04/10/18 0442  BNP 1,164.0*     DDimer No results for input(s): DDIMER in the last 168 hours.   Radiology    Dg Chest Port 1 View  Result Date: 04/11/2018 IMPRESSION: 1. Stable support apparatus. 2. Bilateral pleural effusions and central venous congestion. 3. No interval change. Electronically Signed   By: Suzy Bouchard M.D.   On: 04/11/2018 10:22    Cardiac Studies   Echo 04/10/2018: Study Conclusions  - Left ventricle: The cavity size was normal. Wall thickness was normal. Systolic function was normal. The estimated ejection fraction was in the range of 60% to 65%. - Mitral valve: Moderately thickened, mildly calcified leaflets . - Left atrium: The atrium was mildly dilated. - Right ventricle: The cavity size was mildly to moderately dilated. Wall thickness was mildly increased. - Right atrium: The atrium was mildly dilated. - Tricuspid valve: There was moderate regurgitation. - Pulmonary arteries: Systolic pressure was moderately increased, estimated to be 50 mm Hg plus central venous/right atrial pressure.  Patient Profile     77 y.o. 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  Assessment & Plan    1. PAF with RVR: -Has been intermittently paced and in atrial flutter with variable AV block with well controlled ventricular rates -Continue IV amiodarone for rate control -Will hold off on PO at this time given her acute illness -BP stable in the low 124P systolic, wean phenylephrine as able -Would ideally like to avoid digoxin given her AKI, age, and acute  illness -Has not been on anticoagulation/heparin given anemia with upper GI bleed,resume when/if able -Consider changing albuterol nebs to Xopenex  2.Acute respiratory failure with hypoxia and hypercapnia: -  In the setting of underlying severe COPD, pulmonary hypertension, acute on chronic diastolic CHF, morbid obesity, and possible OHS with possible aspiration pneumonia -Mechanical ventilation per PCCM -Consider PE as well, however CTA chest has been deferred given her AKI -Wean phenylephrine as able per PCCM -Continue gentle IV diuresis as SCr and BP allows  3.Partial SBO: -NG tube decompression -Currently no plans for surgical intervention -Advance p.o. medications when able -Large, bloody BM x 2 overnight  4.Acute blood loss anemia/upper GI bleed/melena: -Large bloody BM x 2 overnight -Status post 1 unit pRBC -GI has evaluated the patient with no plans for endoscopy at this time -Per IM  5. Pre-procedure cardiac evaluation: -Echo this admission demonstrated preserved LVSF -She remains intubated as above -GI is planning for EGD this morning -She is moderate risk for non-cardiac procedure -No further cardiac testing/intervention will decrease this at this time  6. AKI: -Likely ATN in the setting of the above -Monitor with diuresis    For questions or updates, please contact [place] Please consult [email or web address] for contact info under Cardiology/STEMI.    Signed, Christell Faith, PA-C [place] Pager: [phone] 04/12/2018, 9:33 AM   Attending Note Patient seen and examined, agree with detailed note above,  Patient presentation and plan discussed on rounds.   GI bleed in past 24 hours as detailed above Received packed red blood cells Started having bowel movements per nursing, rectal tube in place Converting from normal inus rhythm now atrial flutter with relatively controlled ventricular rate, periodically paced On Neo, amiodarone,  presedex family at the bedside  On physical examination Sedated,  unable to estimate JVD Coarse breath sounds bilaterally,  heart sounds irregularly irregular Abdomen soft, nondistended, no significant lower extremity edema  Lab work reviewed showing hematocrit 26.7 WBC 8.5 creatinine 1.62 BUN 54  A/P: Small bowel obstruction Now having bowel movements rectal tube in place GI following  GI bleed Prior history ofnumerous polyps For EGD today Not on anticoagulation  Paroxysmal atrial fibrillation Now in atrial flutter this morning We continue amiodarone and effort to restore Normal sinus rhythm Not on anticoagulation given GI bleed   Acute on chronic respiratory distress Intubated and sedated  severe COPD, pulmonary hypertension, acute on chronic diastolic CHF, morbid obesity,Anemia,  and possible OHS with possible aspiration pneumonia -gentle diuresis   Discussed with husband at her bedside, all questions answered Greater than 50% was spent in counseling and coordination of care with patient Total encounter time 35 minutes or more   Signed: Esmond Plants  M.D., Ph.D. [facility]

## 2018-04-12 NOTE — Progress Notes (Signed)
[facility] NAME: Fey Coghill    MR#:  [id number]  DATE OF BIRTH:  [birth year]  SUBJECTIVE:   Pt. Had some dark/melanotic stools overnight and Hg. Dropped and he got some blood.  Remains intubated and not ready to be weaned off the vent.    REVIEW OF SYSTEMS:    Review of Systems  Unable to perform ROS: Mental acuity    Nutrition: Tube feedings  Tolerating Diet: yes Tolerating PT: Await Eval.   DRUG ALLERGIES:   Allergies  Allergen Reactions  . Cephalosporins Other (See Comments)    Reaction:  Unknown   . Macrolides And Ketolides Other (See Comments)    Reaction:  Unknown   . Meperidine Shortness Of Breath, Nausea Only and Other (See Comments)    Reaction:  Stomach pain   . Prednisone Anaphylaxis and Other (See Comments)    Reaction:  Increases pts blood sugar  Pt states that she is allergic to all steroids.    . Shellfish Allergy Anaphylaxis, Shortness Of Breath, Diarrhea, Nausea Only and Rash    Reaction:  Stomach pain   . Sulfa Antibiotics Shortness Of Breath, Diarrhea, Nausea Only and Other (See Comments)    Reaction:  Stomach pain   . Uloric [Febuxostat] Anaphylaxis    Locks pt's body up   . Aspirin Other (See Comments)    Reaction:  Stomach pain   . Celecoxib Other (See Comments)    Reaction:  GI bleed, weakness, and stomach pain.    . Cephalexin Diarrhea, Nausea Only and Other (See Comments)    Reaction:  Stomach pain   . Erythromycin Diarrhea, Nausea Only and Other (See Comments)    Reaction:  Stomach pain   . Hydromorphone Other (See Comments)    Reaction:  Hypotension   . Iodinated Diagnostic Agents Rash and Other (See Comments)    Pt states that she is unable to have because she has chronic kidney disease.    Marland Kitchen Oxycodone Other (See Comments)    Reaction:  Stomach pain   . Atorvastatin Nausea Only and Other (See Comments)    Reaction:  Weakness   . Codeine Diarrhea, Nausea Only and Other (See  Comments)    Reaction:  Stomach pain Pt tolerates morphine   . Doxycycline     Stomach pain   . Tape Other (See Comments)    Reaction:  Causes pts skin to tear  Pt states that she is able to use paper tape.    . Valdecoxib Swelling and Other (See Comments)    Pt states that her hands and feet swell.    . Iodine Rash    VITALS:  Blood pressure (!) 142/64, pulse 60, temperature 98.3 F (36.8 C), temperature source Oral, resp. rate 18, height 5\' 6"  (1.676 m), weight 131.2 kg (289 lb 3.9 oz), SpO2 99 %.  PHYSICAL EXAMINATION:   Physical Exam  GENERAL:  77 y.o.-year-old obese patient lying in bed intubated & sedated.  EYES: Pupils equal, round, reactive to light. No scleral icterus. Extraocular muscles intact.  HEENT: Head atraumatic, normocephalic. ET and NG tube in place.  NECK:  Supple, no jugular venous distention. No thyroid enlargement, no tenderness.  LUNGS: Normal breath sounds bilaterally, no wheezing, rales, rhonchi. No use of accessory muscles of respiration.  CARDIOVASCULAR: S1, S2 normal. No murmurs, rubs, or gallops.  ABDOMEN: Soft, NT, ND. Bowel sounds present. No organomegaly or mass.  EXTREMITIES: No cyanosis, clubbing or edema  b/l.    NEUROLOGIC: Sedated & Intubated   PSYCHIATRIC: Sedated & Intubated SKIN: No obvious rash, lesion, or ulcer.   Rectal tube in place with dark/melanotic stools in the tube.    LABORATORY PANEL:   CBC Recent Labs  Lab 04/12/18 0129  WBC 8.5  HGB 8.9*  HCT 26.7*  PLT 142*   ------------------------------------------------------------------------------------------------------------------  Chemistries  Recent Labs  Lab 04/09/18 0248  04/12/18 0129  NA 152*   < > 146*  K 5.1   < > 3.7  CL 123*   < > 113*  CO2 17*   < > 25  GLUCOSE 184*   < > 233*  BUN 73*   < > 54*  CREATININE 1.96*   < > 1.62*  CALCIUM 8.4*   < > 7.3*  MG  --    < > 2.1  AST 42*  --   --   ALT 28  --   --   ALKPHOS 72  --   --   BILITOT 1.0  --    --    < > = values in this interval not displayed.   ------------------------------------------------------------------------------------------------------------------  Cardiac Enzymes Recent Labs  Lab 04/09/18 1458  TROPONINI 0.09*   ------------------------------------------------------------------------------------------------------------------  RADIOLOGY:  US Venous Img Lower Bilateral  Result Date: 04/12/2018 CLINICAL DATA:  77 year old female with bilateral lower extremity edema for the past 3 days. EXAM: BILATERAL LOWER EXTREMITY VENOUS DOPPLER ULTRASOUND TECHNIQUE: Gray-scale sonography with graded compression, as well as color Doppler and duplex ultrasound were performed to evaluate the lower extremity deep venous systems from the level of the common femoral vein and including the common femoral, femoral, profunda femoral, popliteal and calf veins including the posterior tibial, peroneal and gastrocnemius veins when visible. The superficial great saphenous vein was also interrogated. Spectral Doppler was utilized to evaluate flow at rest and with distal augmentation maneuvers in the common femoral, femoral and popliteal veins. COMPARISON:  None. FINDINGS: RIGHT LOWER EXTREMITY Common Femoral Vein: No evidence of thrombus. Normal compressibility, respiratory phasicity and response to augmentation. Saphenofemoral Junction: No evidence of thrombus. Normal compressibility and flow on color Doppler imaging. Profunda Femoral Vein: No evidence of thrombus. Normal compressibility and flow on color Doppler imaging. Femoral Vein: No evidence of thrombus. Normal compressibility, respiratory phasicity and response to augmentation. Popliteal Vein: No evidence of thrombus. Normal compressibility, respiratory phasicity and response to augmentation. Calf Veins: No evidence of thrombus. Normal compressibility and flow on color Doppler imaging. Superficial Great Saphenous Vein: No evidence of thrombus. Normal  compressibility. Venous Reflux:  None. Other Findings:  None. LEFT LOWER EXTREMITY Common Femoral Vein: No evidence of thrombus. Normal compressibility, respiratory phasicity and response to augmentation. Saphenofemoral Junction: No evidence of thrombus. Normal compressibility and flow on color Doppler imaging. Profunda Femoral Vein: No evidence of thrombus. Normal compressibility and flow on color Doppler imaging. Femoral Vein: No evidence of thrombus. Normal compressibility, respiratory phasicity and response to augmentation. Popliteal Vein: No evidence of thrombus. Normal compressibility, respiratory phasicity and response to augmentation. Calf Veins: No evidence of thrombus. Normal compressibility and flow on color Doppler imaging. Superficial Great Saphenous Vein: No evidence of thrombus. Normal compressibility. Venous Reflux:  None. Other Findings:  None. IMPRESSION: No evidence of deep venous thrombosis. Electronically Signed   By: Jacqulynn Cadet M.D.   On: 04/12/2018 12:12   Dg Chest Port 1 View  Result Date: 04/12/2018 CLINICAL DATA:  Check endotracheal tube placement EXAM: PORTABLE CHEST 1 VIEW COMPARISON:  04/11/2018 FINDINGS: Endotracheal  tube, nasogastric catheter and right-sided PICC line are again seen and stable. Pacing device is again noted. Cardiac shadow is stable. Bilateral pleural effusions are again identified. Central vascular congestion is seen and stable. No new focal abnormality is noted. No bony abnormality is seen. IMPRESSION: Stable changes bilaterally.  No new focal abnormality is seen. Electronically Signed   By: Inez Catalina M.D.   On: 04/12/2018 09:31   Dg Chest Port 1 View  Result Date: 04/11/2018 CLINICAL DATA:  Ventilated patient EXAM: PORTABLE CHEST 1 VIEW COMPARISON:  04/10/18 FINDINGS: Endotracheal tube and feeding tube unchanged. PICC line unchanged. Stable cardiac silhouette. Bilateral pleural effusions. Central venous congestion. IMPRESSION: 1. Stable support  apparatus. 2. Bilateral pleural effusions and central venous congestion. 3. No interval change. Electronically Signed   By: Suzy Bouchard M.D.   On: 04/11/2018 10:22     ASSESSMENT AND PLAN:   77 year old female with past medical history of paroxysmal atrial fibrillation status post pacemaker, previous history of TIA, hypertension, morbid obesity, diabetes, chronic kidney disease stage III, anxiety, GERD who presented to the hospital due to abdominal pain and noted to have partial small bowel obstruction.  1.  Acute on chronic respiratory failure with hypoxia and hypercapnia- due to underlying COPD, morbid obesity. -Patient had respiratory arrest 4 days ago and became more hypercapnic and had to be intubated. -Suspected to be secondary to aspiration pneumonia.  -Continue IV Unasyn, continue vent support as per pulmonary/intensivist.    2.  GI bleed- patient was noted to have some dark/melanotic stools with diarrhea overnight.  Hemoglobin had dropped to 7.0 and therefore patient received 1 unit of packed red blood cells. -A gastroenterology consult obtained and patient underwent an upper GI endoscopy which showed a gastric polyp which was leading and it was injected.  Bleeding has stopped.  Follow serial hemoglobin, continue Protonix drip. -Appreciate gastroenterology input.  3.  Acute partial small bowel obstruction- cause of patient's worsening abdominal pain. - resolved.   4.  Acute blood loss anemia- initially thought to be secondary to Mallory-Weiss tear, but patient has history of polyps.  Status post upper GI endoscopy today which showed a bleeding gastric polyp which was injected. -Follow serial hemoglobins, patient's hemoglobin dropped to 7.0 this morning and she has been transfused.  Hemoglobin is improved posttransfusion we will continue to monitor.   5.  Atrial fibrillation with rapid ventricular response- she has now converted to normal sinus rhythm, appreciate cardiology input.   Continue amiodarone drip for now.  Holding Eliquis given melena/GI bleed.   6.  Hypernatremia- continue free water flushes, and sodium is improving.   7.  Aspiration pneumonia-continue IV Unasyn.  8. Hyperkalemia - due to Acute on Chronic renal failure.  - improved with fluids.  Will monitor.    All the records are reviewed and case discussed with Care Management/Social Worker. Management plans discussed with the patient, family and they are in agreement.  CODE STATUS: Full code  DVT Prophylaxis: Ted's & SCD's.   TOTAL TIME TAKING CARE OF THIS PATIENT: 30 minutes.   POSSIBLE D/C unclear, DEPENDING ON CLINICAL CONDITION and course.   Henreitta Leber M.D on 04/12/2018 at 2:33 PM  Between 7am to 6pm - Pager - [phone]  After 6pm go to [email or web address] - [employer] [facility]  Office  [phone]  CC: Primary care physician; [name], MD

## 2018-04-12 NOTE — H&P (Signed)
Jonathon Bellows, MD [address], [place], [place], [place], [address] [id number], [place], [place], [place], [address] Phone: [phone]  Fax: [phone]  Primary Care Physician:  [name], MD   Pre-Procedure History & Physical: HPI:  SOTIRIA KEAST is a 77 y.o. female is here for an endoscopy    Past Medical History:  Diagnosis Date  . A-fib ([place]) 2016  . Acid reflux   . Anxiety   . Bowel obstruction ([place])   . CHF (congestive heart failure) ([place]) 2017  . Chronic kidney disease (CKD) stage G3a/A1, moderately decreased glomerular filtration rate (GFR) between 45-59 mL/min/1.73 square meter and albuminuria creatinine ratio less than 30 mg/g (HCC)   . Diabetes mellitus without complication ([place])   . Dyspnea   . Fatty liver   . GI bleed   . Gout   . Hypertension   . Morbid obesity ([place])   . Osteoarthritis   . Osteoporosis   . Paroxysmal atrial fibrillation (HCC)   . Presence of permanent cardiac pacemaker   . Sinus node dysfunction ([place]) 08/24/2017  . TIA (transient ischemic attack)     Past Surgical History:  Procedure Laterality Date  . ABDOMINAL HYSTERECTOMY  1980  . APPENDECTOMY    . BACK SURGERY     2008  . BREAST BIOPSY Right 08/16   stereo fibroadenomatous change, neg for atypia  . BREAST EXCISIONAL BIOPSY Left    neg  . CARDIAC CATHETERIZATION Left 08/25/2016   Procedure: Left Heart Cath and Coronary Angiography;  Surgeon: Corey Skains, MD;  Location: [facility];  Service: Cardiovascular;  Laterality: Left;  . CARDIOVERSION N/A 06/28/2017   Procedure: CARDIOVERSION;  Surgeon: Corey Skains, MD;  Location: [facility];  Service: Cardiovascular;  Laterality: N/A;  . CARDIOVERSION N/A 02/06/2018   Procedure: CARDIOVERSION;  Surgeon: Josue Hector, MD;  Location: [facility];  Service: Cardiovascular;  Laterality: N/A;  . CATARACT EXTRACTION, BILATERAL  2012  . CHOLECYSTECTOMY    . colon blockage  1999  . COLON SURGERY   1999  . COLONOSCOPY  2016   polyps removed 2016  . DORSAL COMPARTMENT RELEASE Left 09/14/2016   Procedure: RELEASE DORSAL COMPARTMENT (DE[place]);  Surgeon: Dereck Leep, MD;  Location: [facility];  Service: Orthopedics;  Laterality: Left;  . ESOPHAGOGASTRODUODENOSCOPY N/A 01/27/2015   Procedure: ESOPHAGOGASTRODUODENOSCOPY (EGD);  Surgeon: Lollie Sails, MD;  Location: [facility];  Service: Endoscopy;  Laterality: N/A;  . ESOPHAGOGASTRODUODENOSCOPY (EGD) WITH PROPOFOL N/A 07/24/2015   Procedure: ESOPHAGOGASTRODUODENOSCOPY (EGD) WITH PROPOFOL;  Surgeon: Lucilla Lame, MD;  Location: [facility];  Service: Endoscopy;  Laterality: N/A;  . JOINT REPLACEMENT  2014   Bilateral Knee replacement  . LEAD REVISION/REPAIR N/A 08/29/2017   Procedure: LEAD REVISION/REPAIR;  Surgeon: Constance Haw, MD;  Location: [facility];  Service: Cardiovascular;  Laterality: N/A;  . PACEMAKER INSERTION Left 07/01/2015   Procedure: INSERTION PACEMAKER;  Surgeon: Isaias Cowman, MD;  Location: [facility];  Service: Cardiovascular;  Laterality: Left;  . PACEMAKER REVISION N/A 08/28/2017   Procedure: PACEMAKER REVISION;  Surgeon: Deboraha Sprang, MD;  Location: [facility];  Service: Cardiovascular;  Laterality: N/A;  . REPLACEMENT TOTAL KNEE BILATERAL    . SHOULDER ARTHROSCOPY WITH SUBACROMIAL DECOMPRESSION Left 2013  . TEE WITHOUT CARDIOVERSION N/A 06/28/2017   Procedure: TRANSESOPHAGEAL ECHOCARDIOGRAM (TEE);  Surgeon: Corey Skains, MD;  Location: [facility];  Service: Cardiovascular;  Laterality: N/A;    Prior  to Admission medications   Medication Sig Start Date End Date Taking? Authorizing Provider  acetaminophen (TYLENOL) 500 MG tablet Take 1,000 mg by mouth every 6 (six) hours as needed for mild pain or moderate pain.    Yes [provider]  allopurinol (ZYLOPRIM) 100 MG tablet Take 150 mg by mouth daily.    Yes [provider]  amiodarone (PACERONE) 400 MG  tablet Take 1/2 tablet (200 mg) by mouth once daily as directed 03/20/18  Yes Deboraha Sprang, MD  apixaban (ELIQUIS) 5 MG TABS tablet Take 1 tablet (5 mg total) by mouth 2 (two) times daily. 03/05/18  Yes Deboraha Sprang, MD  busPIRone (BUSPAR) 10 MG tablet Take 10 mg by mouth 2 (two) times daily.   Yes [provider]  cholecalciferol (VITAMIN D) 1000 units tablet Take 1,000 Units by mouth daily.   Yes [provider]  colchicine 0.6 MG tablet Take 0.6 mg by mouth daily as needed (gout).    Yes [provider]  docusate sodium (COLACE) 100 MG capsule Take 100 mg by mouth at bedtime.    Yes [provider]  insulin NPH-regular Human (NOVOLIN 70/30) (70-30) 100 UNIT/ML injection Inject 22 Units into the skin 2 (two) times daily before a meal.    Yes [provider]  ketoconazole (NIZORAL) 2 % cream Apply 1 application topically every 2 (two) hours as needed (for rash under breasts).    Yes [provider]  oxybutynin (DITROPAN) 5 MG tablet Take 0.5 tablets (2.5 mg total) by mouth at bedtime. Patient taking differently: Take 10 mg by mouth 2 (two) times daily.  06/13/17  Yes Epifanio Lesches, MD  pantoprazole (PROTONIX) 40 MG tablet Take 1 tablet (40 mg total) by mouth 2 (two) times daily. 12/29/17  Yes Darel Hong, MD  PARoxetine (PAXIL) 20 MG tablet Take 20 mg by mouth daily.   Yes [provider]  Polyethyl Glycol-Propyl Glycol (SYSTANE OP) Apply 1 drop to eye 2 (two) times daily as needed (dry eyes).    Yes [provider]  pravastatin (PRAVACHOL) 20 MG tablet Take 20 mg by mouth at bedtime.    Yes [provider]  talc (ZEASORB) powder Apply 1 application topically as needed (under the breast irritation).   Yes [provider]  traZODone (DESYREL) 50 MG tablet Take 1 tablet (50 mg total) by mouth at bedtime. 06/13/17  Yes Epifanio Lesches, MD  furosemide (LASIX) 20 MG tablet Take 1 tablet (20 mg total)  by mouth daily. Patient not taking: Reported on 04/04/2018 02/07/18   Hosie Poisson, MD  HYDROcodone-acetaminophen (NORCO/VICODIN) 5-325 MG tablet Take 1 tablet by mouth every 6 (six) hours as needed for moderate pain. Patient not taking: Reported on 04/04/2018 08/30/17 08/30/18  Baldwin Jamaica, PA-C  hydrocortisone 2.5 % cream Apply 1 application topically daily as needed (rash).    [provider]    Allergies as of 04/04/2018 - Review Complete 04/04/2018  Allergen Reaction Noted  . Cephalosporins Other (See Comments) 01/19/2015  . Macrolides and ketolides Other (See Comments) 01/19/2015  . Meperidine Shortness Of Breath, Nausea Only, and Other (See Comments) 01/19/2015  . Prednisone Anaphylaxis and Other (See Comments) 01/19/2015  . Shellfish allergy Anaphylaxis, Shortness Of Breath, Diarrhea, Nausea Only, and Rash 06/29/2015  . Sulfa antibiotics Shortness Of Breath, Diarrhea, Nausea Only, and Other (See Comments) 01/19/2015  . Uloric [febuxostat] Anaphylaxis 08/22/2016  . Aspirin Other (See Comments) 01/19/2015  . Celecoxib Other (See Comments) 01/19/2015  .  Cephalexin Diarrhea, Nausea Only, and Other (See Comments) 01/19/2015  . Erythromycin Diarrhea, Nausea Only, and Other (See Comments) 01/19/2015  . Hydromorphone Other (See Comments) 01/19/2015  . Iodinated diagnostic agents Rash and Other (See Comments) 01/19/2015  . Oxycodone Other (See Comments) 01/19/2015  . Atorvastatin Nausea Only and Other (See Comments) 01/19/2015  . Codeine Diarrhea, Nausea Only, and Other (See Comments) 01/19/2015  . Doxycycline  08/24/2017  . Tape Other (See Comments) 06/29/2015  . Valdecoxib Swelling and Other (See Comments) 01/19/2015  . Iodine Rash 06/29/2015    Family History  Problem Relation Age of Onset  . Diabetes Mellitus II Mother   . CAD Mother   . Heart attack Mother   . Cancer Father        skin  . Breast cancer Neg Hx     Social History   Socioeconomic History  .  Marital status: Married    Spouse name: Not on file  . Number of children: Not on file  . Years of education: Not on file  . Highest education level: Not on file  Occupational History  . Occupation: retired  Scientific laboratory technician  . Financial resource strain: Not on file  . Food insecurity:    Worry: Not on file    Inability: Not on file  . Transportation needs:    Medical: Not on file    Non-medical: Not on file  Tobacco Use  . Smoking status: Never Smoker  . Smokeless tobacco: Never Used  Substance and Sexual Activity  . Alcohol use: No  . Drug use: No  . Sexual activity: Yes  Lifestyle  . Physical activity:    Days per week: Not on file    Minutes per session: Not on file  . Stress: Not on file  Relationships  . Social connections:    Talks on phone: Not on file    Gets together: Not on file    Attends religious service: Not on file    Active member of club or organization: Not on file    Attends meetings of clubs or organizations: Not on file    Relationship status: Not on file  . Intimate partner violence:    Fear of current or ex partner: Not on file    Emotionally abused: Not on file    Physically abused: Not on file    Forced sexual activity: Not on file  Other Topics Concern  . Not on file  Social History Narrative  . Not on file    Review of Systems: See HPI, otherwise negative ROS  Physical Exam: BP (!) 116/52   Pulse 60   Temp 98.1 F (36.7 C) (Oral)   Resp 18   Ht 5\' 6"  (1.676 m)   Wt 289 lb 3.9 oz (131.2 kg)   SpO2 100%   BMI 46.69 kg/m  General:   Intubated and sedated Head:  Normocephalic and atraumatic. Neck:  Supple; no masses or thyromegaly. Lungs:  Clear throughout to auscultation, normal respiratory effort.    Heart:  +S1, +S2, Regular rate and rhythm, No edema. Abdomen:  Soft, nontender and nondistended. Normal bowel sounds, without guarding, and without rebound.   Neurologic:  Alert and  oriented x0 , sedated    Impression/Plan: NAKIYA RALLIS is here for an endoscopy  to be performed for  evaluation of GI bleed     Risks, benefits, limitations, and alternatives regarding endoscopy have been reviewed with the patient.  Questions have been answered.  All  parties agreeable.   Jonathon Bellows, MD  04/12/2018, 11:49 AM

## 2018-04-12 NOTE — Progress Notes (Signed)
   04/12/18 1045  Clinical Encounter Type  Visited With Patient and family together  Visit Type Follow-up  Spiritual Encounters  Spiritual Needs Prayer   Chaplain encountered patient's spouse in the hallway and had conversation regarding prayer shawl for patient, especially as she has a procedure scheduled in next half hour.  Spouse suggested a beige or red shawl.  Chaplain returned with a red shawl that she and patient's spouse draped over patient then chaplain led a prayer for patient and her family.  Spouse would like for a chaplain to return later today as able; chaplain also encouraged spouse to page for chaplain as needed.

## 2018-04-12 NOTE — Progress Notes (Signed)
Pharmacy Electrolyte Monitoring Consult:  Pharmacy consulted to assist in monitoring and replacing electrolytes and glucose management in this 77 y.o. female admitted on 04/04/2018 with Hematemesis and Abdominal Pain   Labs:  Sodium (mmol/L)  Date Value  04/12/2018 146 (H)  10/16/2014 141   Potassium (mmol/L)  Date Value  04/12/2018 3.7  10/16/2014 4.6   Magnesium (mg/dL)  Date Value  04/12/2018 2.1   Phosphorus (mg/dL)  Date Value  04/09/2018 3.8   Calcium (mg/dL)  Date Value  04/12/2018 7.3 (L)   Calcium, Total (mg/dL)  Date Value  10/16/2014 8.5   Albumin (g/dL)  Date Value  04/09/2018 2.9 (L)  10/29/2012 3.9    Assessment/Plan: 1. Electrolytes: no replacement warranted. Goal potassium ~ 4 and goal magnesium ~ 2.   Will recheck electrolytes with am labs.   2. Glucose: no further adjustments warranted.   Pharmacy will continue to monitor and adjust per consult.   MLS 04/12/2018 7:46 PM

## 2018-04-12 NOTE — Progress Notes (Signed)
Rectal tube irrigated with 60 cc sterile water.  

## 2018-04-12 NOTE — Progress Notes (Signed)
Pt HGB came back at 7, NP informed, type and crossmatch done. 1 unit PRBC administered without any issue. Pt becoming increasingly agitateded during the night, NP made aware, verbal order to start Precedex. Pt shifting in bed decreased and still following commands, on low dose Precedex. Pt resting more comfortably. Around 0130 pt had X-large BM, tarry, NP made aware 1L bolus given, Protonix gtt started with bolus, Neo restarted due to pt BP. Hgb drawn and came back at 8.9. Will continue to monitor.

## 2018-04-12 NOTE — Progress Notes (Signed)
CRITICAL CARE NOTE  CC  follow up respiratory failure secondary to aspiration pneumonia with GI bleed and SBO.  SUBJECTIVE Patient remains critically ill Prognosis is guarded  77 year old female with respiratory failure secondary to aspiration pneumonia. Patient remains intubated and sedated. Remains on phenylephrine. She continues to be in a fib this morning.   SIGNIFICANT EVENTS  Patient with 2 large tarry BM yesterday. Hemoglobin dropped to 7.0. 1 unit of PRBC given last night and repeat hemoglobin 8.9. Additionally, patient becoming agitated overnight. Precedex started for sedation.   BP (!) 115/49   Pulse 60   Temp 98.1 F (36.7 C) (Oral)   Resp 18   Ht 5' 6"  (1.676 m)   Wt 131.2 kg (289 lb 3.9 oz)   SpO2 100%   BMI 46.69 kg/m    REVIEW OF SYSTEMS  PATIENT IS UNABLE TO PROVIDE COMPLETE REVIEW OF SYSTEM S DUE TO SEVERE CRITICAL ILLNESS AND ENCEPHALOPATHY   PHYSICAL EXAMINATION:  GENERAL:critically ill appearing, +resp distress HEAD: Normocephalic, atraumatic.  EYES: Pupils equal, round, reactive to light.  No scleral icterus.  MOUTH: Moist mucosal membrane. NECK: Supple. No thyromegaly. No nodules. No JVD.  PULMONARY: +bilateral rhonchi anteriorly  CARDIOVASCULAR: S1 and S2. Regular rate and irregular rhythm. No murmurs, rubs, or gallops. 1+ dorsalis pedis pulses bilaterally.  GASTROINTESTINAL: Obese, Soft, nontender, -distended. No masses. Positive bowel sounds. No hepatosplenomegaly.  MUSCULOSKELETAL: +1 pedal edema bilaterally.   NEUROLOGIC: obtunded, GCS<8 SKIN:intact,warm,dry  ASSESSMENT AND PLAN SYNOPSIS  77 year old female with respiratory failure secondary to aspiration pneumonia and COPD. Patient intubated and sedated. Failed SBT x 2 yesterday, no patient effort noted.  2 Large BM last night that were tarry in appearance. Hemoglobin to 7.0, 1 unit of PRBC transfused. Repeat hemoglobin 8.9. Will recheck this morning,   Severe Hypercapnic Respiratory  Failure secondary to aspiration pneumonia -continue Full MV support -continue Bronchodilator Therapy -Wean Fio2 and PEEP as tolerated -will perform SAT/SBt when respiratory parameters are met -CXR: without much improvement, continued vascular congestion with bilateral basilar infiltrates/effusion.  -add dose of lasix due to vascular congestion and failure to wean yesterday  CARDIAC: -Atrial fibrillation: continue IV amiodarone -wean neo synephrine as tolerated -LE ultrasound ordered for DVT evaluation -hold anticoagulation in setting of GI bleed   AKI CKD Stage III -Creatinine 1.60 with GFR <30, not at baseline -Potassium 3.7: replace -Magnesium 2.1  GASTROINTESTINAL: -SBO resolved: surgery following -GI bleed secondary to possible mallory weiss tear  -2 large tarry BM last night with drop in hemoglobin to 7  -consider GI consult -continue tube feeds  NEUROLOGY - intubated and sedated: precedex - minimal sedation to achieve a RASS goal: -1   ID -continue IV abx as prescribed: unasyn  -vancomycin d/c 7/31 -follow up cultures:  -respiratory culture: klebsiella, sensitive to unasyn -leukocytosis: improved (8.5)  Endocrine: -diabetes mellitus: continue sliding scale and scheduled levemir/novolog   DVT:SCD GI: Pantoprazole   TRANSFUSIONS AS NEEDED MONITOR FSBS ASSESS the need for LABS as needed   Critical Care Time devoted to patient care services described in this note is 32 minutes.   Overall, patient is critically ill, prognosis is guarded.  Patient with Multiorgan failure and at high risk for cardiac arrest and death.

## 2018-04-13 DIAGNOSIS — Z950 Presence of cardiac pacemaker: Secondary | ICD-10-CM

## 2018-04-13 LAB — GLUCOSE, CAPILLARY
Glucose-Capillary: 156 mg/dL — ABNORMAL HIGH (ref 70–99)
Glucose-Capillary: 113 mg/dL — ABNORMAL HIGH (ref 70–99)
Glucose-Capillary: 140 mg/dL — ABNORMAL HIGH (ref 70–99)
Glucose-Capillary: 109 mg/dL — ABNORMAL HIGH (ref 70–99)
Glucose-Capillary: 198 mg/dL — ABNORMAL HIGH (ref 70–99)

## 2018-04-13 LAB — BASIC METABOLIC PANEL
Anion gap: 5 (ref 5–15)
BUN: 44 mg/dL — ABNORMAL HIGH (ref 8–23)
CHLORIDE: 109 mmol/L (ref 98–111)
CO2: 29 mmol/L (ref 22–32)
Calcium: 7.4 mg/dL — ABNORMAL LOW (ref 8.9–10.3)
Creatinine, Ser: 1.47 mg/dL — ABNORMAL HIGH (ref 0.44–1.00)
GFR calc non Af Amer: 33 mL/min — ABNORMAL LOW (ref >60)
GFR, EST AFRICAN AMERICAN: 39 mL/min — AB (ref >60)
Glucose, Bld: 144 mg/dL — ABNORMAL HIGH (ref 70–99)
Potassium: 3.1 mmol/L — ABNORMAL LOW (ref 3.5–5.1)
Sodium: 143 mmol/L (ref 135–145)

## 2018-04-13 LAB — MAGNESIUM: Magnesium: 1.6 mg/dL — ABNORMAL LOW (ref 1.7–2.4)

## 2018-04-13 LAB — CBC
HEMATOCRIT: 27.1 % — AB (ref 35.0–47.0)
HEMOGLOBIN: 9.2 g/dL — AB (ref 12.0–16.0)
MCH: 32.2 pg (ref 26.0–34.0)
MCHC: 33.9 g/dL (ref 32.0–36.0)
MCV: 95 fL (ref 80.0–100.0)
Platelets: 154 10*3/uL (ref 150–440)
RBC: 2.85 MIL/uL — ABNORMAL LOW (ref 3.80–5.20)
RDW: 16.1 % — AB (ref 11.5–14.5)
WBC: 7.5 10*3/uL (ref 3.6–11.0)

## 2018-04-13 LAB — PHOSPHORUS: Phosphorus: 2.9 mg/dL (ref 2.5–4.6)

## 2018-04-13 LAB — ALBUMIN: ALBUMIN: 2 g/dL — AB (ref 3.5–5.0)

## 2018-04-13 MED ORDER — POTASSIUM CHLORIDE 20 MEQ PO PACK
40.0000 meq | PACK | Freq: Once | ORAL | Status: AC
  Administered 2018-04-13: 40 meq
  Filled 2018-04-13: qty 2

## 2018-04-13 MED ORDER — POLYVINYL ALCOHOL 1.4 % OP SOLN
1.0000 [drp] | OPHTHALMIC | Status: DC | PRN
  Administered 2018-04-13 – 2018-04-21 (×3): 1 [drp] via OPHTHALMIC
  Filled 2018-04-13: qty 15

## 2018-04-13 MED ORDER — MAGNESIUM SULFATE 4 GM/100ML IV SOLN
4.0000 g | Freq: Once | INTRAVENOUS | Status: AC
  Administered 2018-04-13: 4 g via INTRAVENOUS
  Filled 2018-04-13: qty 100

## 2018-04-13 MED ORDER — DIBUCAINE 1 % RE OINT
TOPICAL_OINTMENT | RECTAL | Status: DC | PRN
Start: 2018-04-13 — End: 2018-04-23
  Filled 2018-04-13: qty 28

## 2018-04-13 MED ORDER — WITCH HAZEL-GLYCERIN EX PADS
MEDICATED_PAD | CUTANEOUS | Status: DC | PRN
  Filled 2018-04-13: qty 100

## 2018-04-13 MED ORDER — ORAL CARE MOUTH RINSE
15.0000 mL | Freq: Two times a day (BID) | OROMUCOSAL | Status: DC
  Administered 2018-04-13 – 2018-04-23 (×16): 15 mL via OROMUCOSAL

## 2018-04-13 MED ORDER — BENZOCAINE-MENTHOL 20-0.5 % EX AERO
1.0000 "application " | INHALATION_SPRAY | Freq: Four times a day (QID) | CUTANEOUS | Status: DC | PRN
  Administered 2018-04-17: 1 via TOPICAL
  Filled 2018-04-13: qty 56

## 2018-04-13 NOTE — Progress Notes (Signed)
CRITICAL CARE NOTE  CC  follow up respiratory failure secondary to aspiration pneumonia with upper GI bleed and SBO.  SUBJECTIVE Patient remains critically ill Prognosis is guarded  77 year old female with respiratory failure secondary to aspiration pneumonia. Patient remains intubated and sedated. EGD performed yesterday which showed a bleeding polyp. Epinephrine was injected with resolution of bleeding. Patient has history of these polyps. Tolerated procedure well.    SIGNIFICANT EVENTS  Hemoglobin has stayed in the 9's over the past 24 hours. No further blood transfusions needed.   BP (!) 114/55   Pulse (!) 59   Temp 99.2 F (37.3 C) (Oral)   Resp (!) 21   Ht 5\' 6"  (1.676 m)   Wt 128.2 kg (282 lb 10.1 oz)   SpO2 100%   BMI 45.62 kg/m    REVIEW OF SYSTEMS  PATIENT IS UNABLE TO PROVIDE COMPLETE REVIEW OF SYSTEM S DUE TO SEVERE CRITICAL ILLNESS AND ENCEPHALOPATHY   PHYSICAL EXAMINATION:  GENERAL:critically ill appearing, +resp distress HEAD: Normocephalic, atraumatic.  EYES: Pupils equal, round, reactive to light.  No scleral icterus.  MOUTH: Moist mucosal membrane. NECK: Supple. No thyromegaly. No nodules. No JVD.  PULMONARY: +rhonchi bilaterally CARDIOVASCULAR: S1 and S2. Irregular rate and rhythm. No murmurs, rubs, or gallops.  GASTROINTESTINAL: Soft, nontender, -distended. No masses. Positive bowel sounds. No hepatosplenomegaly.  MUSCULOSKELETAL: +1 pedal edema bilaterally, swelling of bilateral hands.  NEUROLOGIC: obtunded, GCS<8, following some commands SKIN:intact,warm,dry  ASSESSMENT AND PLAN SYNOPSIS  77 year old female with respiratory failure secondary to pneumonia with upper GI bleed and SBO. Underwent bedside EGD which found gastric polyp to be source of bleeding. Epinephrine injected with resolution of bleeding. Hemoglobin holding overnight.   Severe Hypoxic and Hypercapnic Respiratory Failure -continue Full MV support -continue Bronchodilator  Therapy -Wean Fio2 and PEEP as tolerated -will perform SAT/SBt today  -CXR with improvement yesterday  -40mg  lasix given yesterday   AKI with CKD stage III -hypokalemia: 3.1, replace---low magnesium as well; 1.6, will replace -creatinine continuing to trend down: 1.62-->1.47  -monitor with diuresis   GASTROINTESTINAL: -SBO resolved: surgery has signed off -EGD by Dr. Vicente Males yesterday revealed source of GI bleed: gastric polyps  -hemostasis achieved with epinephrine  -recommend outpatient follow-up for removal of polyps  -if recurrence of bleeding, will consider removal of polyps during this admission -tolerating tube feeds well -hemoglobin 9.2 this morning  NEUROLOGY - intubated and sedated: precedex - minimal sedation to achieve a RASS goal: -1   CARDIAC -atrial fibrillation: continue IV amiodarone -LE ultrasound showed no evidence of DVT -continue diuresis as tolerated, neo-synephrine for pressure support -appreciate cardiology input  ID -continue IV abx as prescribed: cefazolin -follow up cultures:  -respiratory culture: klebsiella -leukocytosis improving: 7.5k   Endocrine -diabetes mellitus: continue SSI and scheduled levemir/novolog  DVT: SCD GI PRX: pantoprazole  TRANSFUSIONS AS NEEDED MONITOR FSBS ASSESS the need for LABS as needed   Critical Care Time devoted to patient care services described in this note is 32 minutes.   Overall, patient is critically ill, prognosis is guarded.  Patient with Multiorgan failure and at high risk for cardiac arrest and death.

## 2018-04-13 NOTE — Progress Notes (Addendum)
Progress Note  Patient Name: Deborah Obrien Date of Encounter: 04/13/2018  Primary Cardiologist: Caryl Comes  Subjective   EGD on 8/1 showed gastric polyp that was injected with epinephrine. Plan for resection as an outpatient, unless there is further bleeding in which case it would be resected sooner. HGB stable at 9.2 this morning without need for further pRBC. SCr improving from 1.62-->1.47. K+ low at 3.1 this morning. Albumin low at 2.0. Magnesium low at 1.6. Lower extremity ultrasound negative for DVT. Documented UOP of 1.37 L for the past 24 hours and net + 18.1 L for the admission. Weight 289-->282 pounds. BP soft this morning in the 24M systolic. Remains on amiodarone and phenylephrine gtts. Remains intubated and sedated. PCCM planning for breathing trial this morning. FiO2 40-->24.  Inpatient Medications    Scheduled Meds: . chlorhexidine gluconate (MEDLINE KIT)  15 mL Mouth Rinse BID  . docusate  100 mg Oral BID  . feeding supplement (PRO-STAT SUGAR FREE 64)  30 mL Per Tube TID  . feeding supplement (VITAL HIGH PROTEIN)  1,000 mL Per Tube Q24H  . free water  200 mL Per Tube Q3H  . insulin aspart  0-15 Units Subcutaneous Q4H  . insulin aspart  5 Units Subcutaneous Q4H  . insulin detemir  20 Units Subcutaneous BID  . ipratropium-albuterol  3 mL Nebulization Q6H  . mouth rinse  15 mL Mouth Rinse 10 times per day  . multivitamin  15 mL Per Tube Daily  . [START ON 04/15/2018] pantoprazole  40 mg Intravenous Q12H  . sodium chloride flush  10-40 mL Intracatheter Q12H   Continuous Infusions: . amiodarone 60 mg/hr (04/13/18 0600)  .  ceFAZolin (ANCEF) IV Stopped (04/13/18 0102)  . dexmedetomidine (PRECEDEX) IV infusion 0.2 mcg/kg/hr (04/13/18 0909)  . fentaNYL infusion INTRAVENOUS Stopped (04/11/18 0900)  . magnesium sulfate 1 - 4 g bolus IVPB 4 g (04/13/18 0851)  . pantoprozole (PROTONIX) infusion 8 mg/hr (04/13/18 0600)  . phenylephrine (NEO-SYNEPHRINE) Adult infusion 10 mcg/min  (04/13/18 0630)   PRN Meds: acetaminophen **OR** acetaminophen, bisacodyl, fentaNYL (SUBLIMAZE) injection, metoprolol tartrate, midazolam, midazolam, ondansetron **OR** ondansetron (ZOFRAN) IV, phenol, sennosides, sodium chloride flush   Vital Signs    Vitals:   04/13/18 0500 04/13/18 0600 04/13/18 0700 04/13/18 0800  BP: (!) 95/50 (!) 114/55 (!) 107/49 (!) 89/59  Pulse: (!) 56 (!) 59 60 100  Resp: 18 (!) 21 18 18   Temp: 99.2 F (37.3 C)   99.3 F (37.4 C)  TempSrc: Oral   Axillary  SpO2: 98% 100% 98% 98%  Weight: 282 lb 10.1 oz (128.2 kg)     Height:        Intake/Output Summary (Last 24 hours) at 04/13/2018 0928 Last data filed at 04/13/2018 0855 Gross per 24 hour  Intake 3432.22 ml  Output 4800 ml  Net -1367.78 ml   Filed Weights   04/11/18 0311 04/12/18 0448 04/13/18 0500  Weight: 286 lb 6 oz (129.9 kg) 289 lb 3.9 oz (131.2 kg) 282 lb 10.1 oz (128.2 kg)    Telemetry    Continued intermittent episodes of atrial flutter with RVR into the low 100s bpm with spontaneous conversion to sinus rhythm with atrial pacing. Currently atrial paced in sinus rhythm - Personally Reviewed  ECG    n/a - Personally Reviewed  Physical Exam   GEN: Critically ill appearing.   Neck: No JVD. Cardiac: RRR, no murmurs, rubs, or gallops.  Respiratory: Diminished breath sounds bilaterally.  GI: Soft, nontender,  non-distended.   MS: 1+ bilateral pretibial edema; No deformity. Neuro:  Intubated and sedated.  Psych: Intubated and sedated.  Labs    Chemistry Recent Labs  Lab 04/09/18 0248  04/11/18 0312 04/12/18 0129 04/13/18 0521  NA 152*   < > 149* 146* 143  K 5.1   < > 3.5 3.7 3.1*  CL 123*   < > 116* 113* 109  CO2 17*   < > 29 25 29   GLUCOSE 184*   < > 310* 233* 144*  BUN 73*   < > 56* 54* 44*  CREATININE 1.96*   < > 1.54* 1.62* 1.47*  CALCIUM 8.4*   < > 7.7* 7.3* 7.4*  PROT 5.8*  --   --   --   --   ALBUMIN 2.9*  --   --   --  2.0*  AST 42*  --   --   --   --   ALT 28  --    --   --   --   ALKPHOS 72  --   --   --   --   BILITOT 1.0  --   --   --   --   GFRNONAA 23*   < > 31* 30* 33*  GFRAA 27*   < > 36* 34* 39*  ANIONGAP 12   < > 4* 8 5   < > = values in this interval not displayed.     Hematology Recent Labs  Lab 04/11/18 1848 04/12/18 0129 04/12/18 1627 04/13/18 0521  WBC 7.5 8.5  --  7.5  RBC 2.13* 2.73*  --  2.85*  HGB 7.0* 8.9* 9.8* 9.2*  HCT 21.5* 26.7* 28.7* 27.1*  MCV 100.6* 98.0  --  95.0  MCH 32.7 32.5  --  32.2  MCHC 32.5 33.1  --  33.9  RDW 16.2* 16.5*  --  16.1*  PLT 131* 142*  --  154    Cardiac Enzymes Recent Labs  Lab 04/09/18 0251 04/09/18 0951 04/09/18 1458  TROPONINI 0.08* 0.10* 0.09*   No results for input(s): TROPIPOC in the last 168 hours.   BNP Recent Labs  Lab 04/10/18 0442  BNP 1,164.0*     DDimer No results for input(s): DDIMER in the last 168 hours.   Radiology    US Venous Img Lower Bilateral  Result Date: 04/12/2018 IMPRESSION: No evidence of deep venous thrombosis. Electronically Signed   By: Jacqulynn Cadet M.D.   On: 04/12/2018 12:12   Dg Chest Port 1 View  Result Date: 04/12/2018 IMPRESSION: Stable changes bilaterally.  No new focal abnormality is seen. Electronically Signed   By: Inez Catalina M.D.   On: 04/12/2018 09:31   Dg Chest Port 1 View  Result Date: 04/11/2018 IMPRESSION: 1. Stable support apparatus. 2. Bilateral pleural effusions and central venous congestion. 3. No interval change. Electronically Signed   By: Suzy Bouchard M.D.   On: 04/11/2018 10:22    Cardiac Studies   Echo 04/10/2018: Study Conclusions  - Left ventricle: The cavity size was normal. Wall thickness was normal. Systolic function was normal. The estimated ejection fraction was in the range of 60% to 65%. - Mitral valve: Moderately thickened, mildly calcified leaflets . - Left atrium: The atrium was mildly dilated. - Right ventricle: The cavity size was mildly to moderately dilated. Wall thickness  was mildly increased. - Right atrium: The atrium was mildly dilated. - Tricuspid valve: There was moderate regurgitation. - Pulmonary arteries: Systolic  pressure was moderately increased, estimated to be 50 mm Hg plus central venous/right atrial pressure.  Patient Profile     77 y.o. 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  Assessment & Plan    1. PAF with RVR: -Has been intermittently paced and in atrial flutter with variable AV block with well controlled ventricular rates -Continue IV amiodarone for rate control -Will hold off on PO at this time given her acute illness -BP stable in the low 456Y systolic, wean phenylephrine as able -Would ideally like to avoid digoxin given her AKI, age, and acute illness -Has not been on anticoagulation/heparin given anemia with upper GI bleed,resume when/if able -Consider changing albuterol nebs to Xopenex  2.Acute respiratory failure with hypoxia and hypercapnia: -In the setting of underlying severe COPD,pulmonary hypertension, acute on chronic diastolic CHF,morbid obesity, and possible OHS with possible aspiration pneumonia -Mechanical ventilation, weaning per PCCM -Consider PE as well, however CTA chest has been deferred given her AKI -Wean phenylephrine as able per PCCM -Continue gentle IV diuresis as SCr and BP allows  3.Partial SBO: -NG tube decompression -Currently no plans for surgical intervention -Advance p.o. medications when able -Large, bloody BM x 2 on 8/1  4.Acute blood loss anemia/upper GI bleed/melena/hypoalbuminemia: -Large bloody BM x 2 overnight into 8/1 -Status post 1 unit pRBC on 8/1, no further blood products received  -EGD 8/1 as above -Contributing to her 3rd spacing -Per IM  5. AKI: -Improving -Likely ATN in the setting of the above -Monitor with diuresis    For questions  or updates, please contact [place] Please consult [email or web address] for contact info under Cardiology/STEMI.    Signed, Christell Faith, PA-C [facility] Pager: [phone] 04/13/2018, 9:28 AM   Attending Note Patient seen and examined, agree with detailed note above,  Patient presentation and plan discussed on rounds.   EGD yesterday polyp injected Hemoglobin stable Weight down, FiO2 24% Blood pressure low, still on IV pressors Intubated sedated  On examination no distress intubatedunarousable armspuffy unable to estimate JVD coarse lung sounds heart sounds regular S1-S2 appreciated abdomen obese soft nontender lower extremity with1+ pitting woody edema  Telemetry showing sinus Paced rhythm  Lab work reviewed showing potassium 3.1 creatinine 1.47 BUN 44 hemoglobin 9.2 previously 7.5  A/P Paroxysmal atrial fibrillation/Flutter Past 24 hours has had fluctuating sinus with flutter Not on anticoagulation given GI bleed -Recommend we continue amiodarone at 1 mg/m She is not taking oral medications yet  Small bowel obstruction Rectal tube in place, has been having bowel movements  Acute on chronic respiratory distress Multifactorial ICU team attempting to wean off the ventilator Underlying COPD, other comorbidities  Blood loss anemia Blood count stable after blood transfusion GI with injection of polyp yesterday  Case discussed with husband at the bedside Greater than 50% was spent in counseling and coordination of care with patient Total encounter time 35 minutes or more   Signed: Esmond Plants  M.D., Ph.D. [facility]

## 2018-04-13 NOTE — Progress Notes (Signed)
Pt extubated without complications, no stridor noted, placed on 2lpm [place], sats 95%, respiratory rate 20/min, will continue to monitor.

## 2018-04-13 NOTE — Progress Notes (Signed)
Pharmacy Antibiotic Note  Deborah Obrien is a 77 y.o. female admitted on 04/04/2018 with pneumonia.  Pharmacy has been consulted for cefazolin dosing. Patient previously received Unasyn and Vancomycin.  Plan: Continue cefazolin 1g IV Q8hr.   Height: 5\' 6"  (167.6 cm) Weight: 282 lb 10.1 oz (128.2 kg) IBW/kg (Calculated) : 59.3  Temp (24hrs), Avg:98.6 F (37 C), Min:97.8 F (36.6 C), Max:99.3 F (37.4 C)  Recent Labs  Lab 04/09/18 0248  04/09/18 0403 04/09/18 0546 04/10/18 0442 04/11/18 0312 04/11/18 1848 04/12/18 0129 04/13/18 0521  WBC  --    < >  --   --  11.3* 10.4 7.5 8.5 7.5  CREATININE 1.96*  --   --   --  1.80* 1.54*  --  1.62* 1.47*  LATICACIDVEN  --   --  1.9 1.4  --   --   --   --   --    < > = values in this interval not displayed.    Estimated Creatinine Clearance: 44 mL/min (A) (by C-G formula based on SCr of 1.47 mg/dL (H)).    Allergies  Allergen Reactions  . Cephalosporins Other (See Comments)    Reaction:  Unknown   . Macrolides And Ketolides Other (See Comments)    Reaction:  Unknown   . Meperidine Shortness Of Breath, Nausea Only and Other (See Comments)    Reaction:  Stomach pain   . Prednisone Anaphylaxis and Other (See Comments)    Reaction:  Increases pts blood sugar  Pt states that she is allergic to all steroids.    . Shellfish Allergy Anaphylaxis, Shortness Of Breath, Diarrhea, Nausea Only and Rash    Reaction:  Stomach pain   . Sulfa Antibiotics Shortness Of Breath, Diarrhea, Nausea Only and Other (See Comments)    Reaction:  Stomach pain   . Uloric [Febuxostat] Anaphylaxis    Locks pt's body up   . Aspirin Other (See Comments)    Reaction:  Stomach pain   . Celecoxib Other (See Comments)    Reaction:  GI bleed, weakness, and stomach pain.    . Cephalexin Diarrhea, Nausea Only and Other (See Comments)    Reaction:  Stomach pain   . Erythromycin Diarrhea, Nausea Only and Other (See Comments)    Reaction:  Stomach pain   .  Hydromorphone Other (See Comments)    Reaction:  Hypotension   . Iodinated Diagnostic Agents Rash and Other (See Comments)    Pt states that she is unable to have because she has chronic kidney disease.    Marland Kitchen Oxycodone Other (See Comments)    Reaction:  Stomach pain   . Atorvastatin Nausea Only and Other (See Comments)    Reaction:  Weakness   . Codeine Diarrhea, Nausea Only and Other (See Comments)    Reaction:  Stomach pain Pt tolerates morphine   . Doxycycline     Stomach pain   . Tape Other (See Comments)    Reaction:  Causes pts skin to tear  Pt states that she is able to use paper tape.    . Valdecoxib Swelling and Other (See Comments)    Pt states that her hands and feet swell.    . Iodine Rash    Antimicrobials this admission: Zosyn 7/29 x 1  Unasyn 7/29 >> 8/1 Vancomycin 7/29 >> 7/31 Cefazolin 8/1 >>  Dose adjustments this admission: N/A  Microbiology results: 7/29 Sputum: klebsiella 7/26  MRSA PCR: negative  8/1 Cdiff Scan: negative  Thank you for allowing pharmacy to be a part of this patient's care.  Simpson,Michael L 04/13/2018 6:10 PM

## 2018-04-13 NOTE — Progress Notes (Signed)
[facility] [place] NAME: Deborah Obrien    MR#:  [id number]  DATE OF BIRTH:  [birth year]  SUBJECTIVE:   No further bleeding overnight, hemoglobin stable posttransfusion.  Patient was extubated this morning.  No other acute events overnight.  Patient's husband is at bedside.  Plan to start on clear liquids today if tolerating DC NG tube.  REVIEW OF SYSTEMS:    Review of Systems  Unable to perform ROS: Mental acuity    Nutrition: Clear Liquids Tolerating Diet: yes Tolerating PT: Await Eval.   DRUG ALLERGIES:   Allergies  Allergen Reactions  . Cephalosporins Other (See Comments)    Reaction:  Unknown   . Macrolides And Ketolides Other (See Comments)    Reaction:  Unknown   . Meperidine Shortness Of Breath, Nausea Only and Other (See Comments)    Reaction:  Stomach pain   . Prednisone Anaphylaxis and Other (See Comments)    Reaction:  Increases pts blood sugar  Pt states that she is allergic to all steroids.    . Shellfish Allergy Anaphylaxis, Shortness Of Breath, Diarrhea, Nausea Only and Rash    Reaction:  Stomach pain   . Sulfa Antibiotics Shortness Of Breath, Diarrhea, Nausea Only and Other (See Comments)    Reaction:  Stomach pain   . Uloric [Febuxostat] Anaphylaxis    Locks pt's body up   . Aspirin Other (See Comments)    Reaction:  Stomach pain   . Celecoxib Other (See Comments)    Reaction:  GI bleed, weakness, and stomach pain.    . Cephalexin Diarrhea, Nausea Only and Other (See Comments)    Reaction:  Stomach pain   . Erythromycin Diarrhea, Nausea Only and Other (See Comments)    Reaction:  Stomach pain   . Hydromorphone Other (See Comments)    Reaction:  Hypotension   . Iodinated Diagnostic Agents Rash and Other (See Comments)    Pt states that she is unable to have because she has chronic kidney disease.    Marland Kitchen Oxycodone Other (See Comments)    Reaction:  Stomach pain   . Atorvastatin Nausea Only and Other (See  Comments)    Reaction:  Weakness   . Codeine Diarrhea, Nausea Only and Other (See Comments)    Reaction:  Stomach pain Pt tolerates morphine   . Doxycycline     Stomach pain   . Tape Other (See Comments)    Reaction:  Causes pts skin to tear  Pt states that she is able to use paper tape.    . Valdecoxib Swelling and Other (See Comments)    Pt states that her hands and feet swell.    . Iodine Rash    VITALS:  Blood pressure (!) 108/45, pulse 61, temperature 98.3 F (36.8 C), temperature source Axillary, resp. rate (!) 25, height 5\' 6"  (1.676 m), weight 128.2 kg (282 lb 10.1 oz), SpO2 100 %.  PHYSICAL EXAMINATION:   Physical Exam  GENERAL:  77 y.o.-year-old obese patient lying in bed in NAD.  EYES: Pupils equal, round, reactive to light. No scleral icterus. Extraocular muscles intact.  HEENT: Head atraumatic, normocephalic.  NECK:  Supple, no jugular venous distention. No thyroid enlargement, no tenderness.  LUNGS: Normal breath sounds bilaterally, no wheezing, rales, rhonchi. No use of accessory muscles of respiration.  CARDIOVASCULAR: S1, S2 normal. No murmurs, rubs, or gallops.  ABDOMEN: Soft, NT, ND. Bowel sounds present. No organomegaly or mass.  EXTREMITIES: No cyanosis,  clubbing or edema b/l.    NEUROLOGIC: Cranial nerves II through XII intact, no focal motor or sensory deficits appreciated bilaterally. PSYCHIATRIC: Alert awake oriented x1.  Follows simple commands. SKIN: No obvious rash, lesion, or ulcer.   Rectal tube in place with dark/melanotic stools in the tube.    LABORATORY PANEL:   CBC Recent Labs  Lab 04/13/18 0521  WBC 7.5  HGB 9.2*  HCT 27.1*  PLT 154   ------------------------------------------------------------------------------------------------------------------  Chemistries  Recent Labs  Lab 04/09/18 0248  04/13/18 0521  NA 152*   < > 143  K 5.1   < > 3.1*  CL 123*   < > 109  CO2 17*   < > 29  GLUCOSE 184*   < > 144*  BUN 73*   < > 44*   CREATININE 1.96*   < > 1.47*  CALCIUM 8.4*   < > 7.4*  MG  --    < > 1.6*  AST 42*  --   --   ALT 28  --   --   ALKPHOS 72  --   --   BILITOT 1.0  --   --    < > = values in this interval not displayed.   ------------------------------------------------------------------------------------------------------------------  Cardiac Enzymes Recent Labs  Lab 04/09/18 1458  TROPONINI 0.09*   ------------------------------------------------------------------------------------------------------------------  RADIOLOGY:  US Venous Img Lower Bilateral  Result Date: 04/12/2018 CLINICAL DATA:  77 year old female with bilateral lower extremity edema for the past 3 days. EXAM: BILATERAL LOWER EXTREMITY VENOUS DOPPLER ULTRASOUND TECHNIQUE: Gray-scale sonography with graded compression, as well as color Doppler and duplex ultrasound were performed to evaluate the lower extremity deep venous systems from the level of the common femoral vein and including the common femoral, femoral, profunda femoral, popliteal and calf veins including the posterior tibial, peroneal and gastrocnemius veins when visible. The superficial great saphenous vein was also interrogated. Spectral Doppler was utilized to evaluate flow at rest and with distal augmentation maneuvers in the common femoral, femoral and popliteal veins. COMPARISON:  None. FINDINGS: RIGHT LOWER EXTREMITY Common Femoral Vein: No evidence of thrombus. Normal compressibility, respiratory phasicity and response to augmentation. Saphenofemoral Junction: No evidence of thrombus. Normal compressibility and flow on color Doppler imaging. Profunda Femoral Vein: No evidence of thrombus. Normal compressibility and flow on color Doppler imaging. Femoral Vein: No evidence of thrombus. Normal compressibility, respiratory phasicity and response to augmentation. Popliteal Vein: No evidence of thrombus. Normal compressibility, respiratory phasicity and response to augmentation.  Calf Veins: No evidence of thrombus. Normal compressibility and flow on color Doppler imaging. Superficial Great Saphenous Vein: No evidence of thrombus. Normal compressibility. Venous Reflux:  None. Other Findings:  None. LEFT LOWER EXTREMITY Common Femoral Vein: No evidence of thrombus. Normal compressibility, respiratory phasicity and response to augmentation. Saphenofemoral Junction: No evidence of thrombus. Normal compressibility and flow on color Doppler imaging. Profunda Femoral Vein: No evidence of thrombus. Normal compressibility and flow on color Doppler imaging. Femoral Vein: No evidence of thrombus. Normal compressibility, respiratory phasicity and response to augmentation. Popliteal Vein: No evidence of thrombus. Normal compressibility, respiratory phasicity and response to augmentation. Calf Veins: No evidence of thrombus. Normal compressibility and flow on color Doppler imaging. Superficial Great Saphenous Vein: No evidence of thrombus. Normal compressibility. Venous Reflux:  None. Other Findings:  None. IMPRESSION: No evidence of deep venous thrombosis. Electronically Signed   By: Jacqulynn Cadet M.D.   On: 04/12/2018 12:12   Dg Chest Port 1 View  Result Date: 04/12/2018  CLINICAL DATA:  Check endotracheal tube placement EXAM: PORTABLE CHEST 1 VIEW COMPARISON:  04/11/2018 FINDINGS: Endotracheal tube, nasogastric catheter and right-sided PICC line are again seen and stable. Pacing device is again noted. Cardiac shadow is stable. Bilateral pleural effusions are again identified. Central vascular congestion is seen and stable. No new focal abnormality is noted. No bony abnormality is seen. IMPRESSION: Stable changes bilaterally.  No new focal abnormality is seen. Electronically Signed   By: Inez Catalina M.D.   On: 04/12/2018 09:31     ASSESSMENT AND PLAN:   77 year old female with past medical history of paroxysmal atrial fibrillation status post pacemaker, previous history of TIA,  hypertension, morbid obesity, diabetes, chronic kidney disease stage III, anxiety, GERD who presented to the hospital due to abdominal pain and noted to have partial small bowel obstruction.  1.  Acute on chronic respiratory failure with hypoxia and hypercapnia- due to underlying COPD, morbid obesity, Aspiration Pneumonia. -Patient extubated today.  Doing well from the respiratory standpoint.  Patient's sputum cultures were positive for Klebsiella. -Switched from IV Unasyn to Ancef.  Continue further care as per pulmonary/intensivist.  2.  GI bleed- patient was noted to have some dark/melanotic stools with diarrhea overnight.  Hemoglobin had dropped to 7.0 and therefore patient received 1 unit of packed red blood cells. -A gastroenterology consult obtained and patient underwent an upper GI endoscopy which showed a gastric polyp which was bleeding and it was injected.  Bleeding has stopped.   Hemoglobin stable, continue PPI. - Patient has been started on a clear liquid diet.  3.  Acute partial small bowel obstruction- cause of patient's worsening abdominal pain on admission but now resolved. NO acute issue  4.  Acute blood loss anemia- initially thought to be secondary to Mallory-Weiss tear, but patient has history of Hyperplastic polyps.  Status post upper GI endoscopy yesterday which showed a bleeding gastric polyp which was injected. -Hemoglobin improved posttransfusion stable at 9.2 today.  No other acute bleeding overnight.  5.  Atrial fibrillation with rapid ventricular response- she has now converted to normal sinus rhythm, appreciate cardiology input.  Continue amiodarone drip for now.  Holding Eliquis given melena/GI bleed.   6.  Hypernatremia- continue free water flushes, and sodium normalized now.  7.  Aspiration pneumonia- pt. Was on IV Unasyn.  Urine cultures were positive for Klebsiella and its pansensitive.  Continue IV Ancef for now.  8. Hyperkalemia - due to Acute on Chronic  renal failure.  -Resolved   All the records are reviewed and case discussed with Care Management/Social Worker. Management plans discussed with the patient, family and they are in agreement.  CODE STATUS: Full code  DVT Prophylaxis: Ted's & SCD's.   TOTAL TIME TAKING CARE OF THIS PATIENT: 30 minutes.   POSSIBLE D/C unclear, DEPENDING ON CLINICAL CONDITION and course.   Henreitta Leber M.D on 04/13/2018 at 3:38 PM  Between 7am to 6pm - Pager - [phone]  After 6pm go to [email or web address] - Technical brewer [facility]  Office  [phone]  CC: Primary care physician; [name], MD

## 2018-04-13 NOTE — Progress Notes (Signed)
   04/13/18 1320  Clinical Encounter Type  Visited With Patient and family together  Visit Type Follow-up  Spiritual Encounters  Spiritual Needs Emotional   Chaplain followed up with patient and family.  Visit deferred so patient could rest.  Family reported that they felt she was doing well after the extubation this morning.

## 2018-04-13 NOTE — Progress Notes (Signed)
Deborah Obrien , MD [address], [place], [place], [place], [address] [address] [place], [place], [place], [address] Phone: [phone]  Fax: [phone]   Deborah Obrien is being followed for GI bleed    Subjective: Extrubated, she feels well, NG tube showed no further blood on aspiration. Discussed with nurses.    Objective: Vital signs in last 24 hours: Vitals:   04/13/18 0930 04/13/18 1000 04/13/18 1042 04/13/18 1100  BP: (!) 98/44 (!) 102/41  90/66  Pulse: 64 (!) 59 60 60  Resp: 19 (!) 21 (!) 21 (!) 25  Temp:      TempSrc:      SpO2: 95% 94% 100% 100%  Weight:      Height:       Weight change: -6 lb 9.8 oz (-3 kg)  Intake/Output Summary (Last 24 hours) at 04/13/2018 1128 Last data filed at 04/13/2018 1000 Gross per 24 hour  Intake 5111.62 ml  Output 4000 ml  Net 1111.62 ml     Exam: Heart:: Regular rate and rhythm, S1S2 present or without murmur or extra heart sounds Lungs: normal and decreased air entry at bases Abdomen: soft, nontender, normal bowel sounds   Lab Results: @LABTEST2 @ Micro Results: Recent Results (from the past 240 hour(s))  MRSA PCR Screening     Status: None   Collection Time: 04/06/18  9:05 AM  Result Value Ref Range Status   MRSA by PCR NEGATIVE NEGATIVE Final    Comment:        The GeneXpert MRSA Assay (FDA approved for NASAL specimens only), is one component of a comprehensive MRSA colonization surveillance program. It is not intended to diagnose MRSA infection nor to guide or monitor treatment for MRSA infections. Performed at [facility], [address] [place], [place] [address]   Culture, respiratory (non-expectorated)     Status: None   Collection Time: 04/09/18  7:43 AM  Result Value Ref Range Status   Specimen Description   Final    TRACHEAL ASPIRATE Performed at [facility], [address], [place], [place] [address]    Special Requests   Final    NONE Performed at [facility], [place], [place] [address]    Gram Stain   Final    FEW WBC PRESENT,BOTH PMN AND MONONUCLEAR MODERATE GRAM POSITIVE COCCI FEW GRAM NEGATIVE RODS Performed at [facility], [place] [address] [place],  [address]    Culture MODERATE KLEBSIELLA PNEUMONIAE  Final   Report Status 04/11/2018 FINAL  Final   Organism ID, Bacteria KLEBSIELLA PNEUMONIAE  Final      Susceptibility   Klebsiella pneumoniae - MIC*    AMPICILLIN RESISTANT Resistant     CEFAZOLIN <=4 SENSITIVE Sensitive     CEFEPIME <=1 SENSITIVE Sensitive     CEFTAZIDIME <=1 SENSITIVE Sensitive     CEFTRIAXONE <=1 SENSITIVE Sensitive     CIPROFLOXACIN <=0.25 SENSITIVE Sensitive     GENTAMICIN <=1 SENSITIVE Sensitive     IMIPENEM <=0.25 SENSITIVE Sensitive     TRIMETH/SULFA <=20 SENSITIVE Sensitive     AMPICILLIN/SULBACTAM 8 SENSITIVE Sensitive     PIP/TAZO <=4 SENSITIVE Sensitive     Extended ESBL NEGATIVE Sensitive     * MODERATE KLEBSIELLA PNEUMONIAE  C difficile quick scan w PCR reflex     Status: None   Collection Time: 04/12/18  3:12 AM  Result Value Ref Range Status   C Diff antigen NEGATIVE NEGATIVE Final  C Diff toxin NEGATIVE NEGATIVE Final   C Diff interpretation No C. difficile detected.  Final    Comment: Performed at [facility], [place], [place] [address]   Studies/Results: US Venous Img Lower Bilateral  Result Date: 04/12/2018 CLINICAL DATA:  77 year old female with bilateral lower extremity edema for the past 3 days. EXAM: BILATERAL LOWER EXTREMITY VENOUS DOPPLER ULTRASOUND TECHNIQUE: Gray-scale sonography with graded compression, as well as color Doppler and duplex ultrasound were performed to evaluate the lower extremity deep venous systems from the level of the common femoral vein and including the common femoral, femoral, profunda femoral, popliteal and calf veins including the posterior tibial, peroneal and gastrocnemius veins when visible.  The superficial great saphenous vein was also interrogated. Spectral Doppler was utilized to evaluate flow at rest and with distal augmentation maneuvers in the common femoral, femoral and popliteal veins. COMPARISON:  None. FINDINGS: RIGHT LOWER EXTREMITY Common Femoral Vein: No evidence of thrombus. Normal compressibility, respiratory phasicity and response to augmentation. Saphenofemoral Junction: No evidence of thrombus. Normal compressibility and flow on color Doppler imaging. Profunda Femoral Vein: No evidence of thrombus. Normal compressibility and flow on color Doppler imaging. Femoral Vein: No evidence of thrombus. Normal compressibility, respiratory phasicity and response to augmentation. Popliteal Vein: No evidence of thrombus. Normal compressibility, respiratory phasicity and response to augmentation. Calf Veins: No evidence of thrombus. Normal compressibility and flow on color Doppler imaging. Superficial Great Saphenous Vein: No evidence of thrombus. Normal compressibility. Venous Reflux:  None. Other Findings:  None. LEFT LOWER EXTREMITY Common Femoral Vein: No evidence of thrombus. Normal compressibility, respiratory phasicity and response to augmentation. Saphenofemoral Junction: No evidence of thrombus. Normal compressibility and flow on color Doppler imaging. Profunda Femoral Vein: No evidence of thrombus. Normal compressibility and flow on color Doppler imaging. Femoral Vein: No evidence of thrombus. Normal compressibility, respiratory phasicity and response to augmentation. Popliteal Vein: No evidence of thrombus. Normal compressibility, respiratory phasicity and response to augmentation. Calf Veins: No evidence of thrombus. Normal compressibility and flow on color Doppler imaging. Superficial Great Saphenous Vein: No evidence of thrombus. Normal compressibility. Venous Reflux:  None. Other Findings:  None. IMPRESSION: No evidence of deep venous thrombosis. Electronically Signed   By: Jacqulynn Cadet M.D.   On: 04/12/2018 12:12   Dg Chest Port 1 View  Result Date: 04/12/2018 CLINICAL DATA:  Check endotracheal tube placement EXAM: PORTABLE CHEST 1 VIEW COMPARISON:  04/11/2018 FINDINGS: Endotracheal tube, nasogastric catheter and right-sided PICC line are again seen and stable. Pacing device is again noted. Cardiac shadow is stable. Bilateral pleural effusions are again identified. Central vascular congestion is seen and stable. No new focal abnormality is noted. No bony abnormality is seen. IMPRESSION: Stable changes bilaterally.  No new focal abnormality is seen. Electronically Signed   By: Inez Catalina M.D.   On: 04/12/2018 09:31   Medications: I have reviewed the patient's current medications. Scheduled Meds: . docusate  100 mg Oral BID  . feeding supplement (PRO-STAT SUGAR FREE 64)  30 mL Per Tube TID  . feeding supplement (VITAL HIGH PROTEIN)  1,000 mL Per Tube Q24H  . free water  200 mL Per Tube Q3H  . insulin aspart  0-15 Units Subcutaneous Q4H  . ipratropium-albuterol  3 mL Nebulization Q6H  . mouth rinse  15 mL Mouth Rinse BID  . multivitamin  15 mL Per Tube Daily  . [START ON 04/15/2018] pantoprazole  40 mg Intravenous Q12H  . potassium chloride  40 mEq  Per Tube Once  . sodium chloride flush  10-40 mL Intracatheter Q12H   Continuous Infusions: . amiodarone 60 mg/hr (04/13/18 1000)  .  ceFAZolin (ANCEF) IV Stopped (04/13/18 3790)  . dexmedetomidine (PRECEDEX) IV infusion Stopped (04/13/18 1030)  . pantoprozole (PROTONIX) infusion 8 mg/hr (04/13/18 1039)  . phenylephrine (NEO-SYNEPHRINE) Adult infusion 20 mcg/min (04/13/18 1010)   PRN Meds:.acetaminophen **OR** acetaminophen, metoprolol tartrate, ondansetron **OR** ondansetron (ZOFRAN) IV, phenol, polyvinyl alcohol, sodium chloride flush   Assessment: Principal Problem:   Intestinal obstruction (HCC) Active Problems:   PAF (paroxysmal atrial fibrillation) (HCC)   Body mass index (BMI) of 40.0-44.9 in adult  [facility])   Acute on chronic renal insufficiency   Diabetic neuropathy with neurologic complication (HCC)   Sinus node dysfunction (HCC)   Atrial fibrillation with RVR (HCC)   Hematemesis   Acute respiratory failure (HCC)   Chronic anticoagulation   Pacemaker   H/O TIA (transient ischemic attack) and stroke   CAD (coronary artery disease)  CBC Latest Ref Rng & Units 04/13/2018 04/12/2018 04/12/2018  WBC 3.6 - 11.0 K/uL 7.5 - 8.5  Hemoglobin 12.0 - 16.0 g/dL 9.2(L) 9.8(L) 8.9(L)  Hematocrit 35.0 - 47.0 % 27.1(L) 28.7(L) 26.7(L)  Platelets 150 - 440 K/uL 154 - 142(L)    Deborah Obrien 77 y.o. female seen by Dr Allen Norris on 04/05/18 for drop in HB and maroon aspirate from NG tube . She had been on Eloquis. Felt to be due to Eloquis. . Plan was to perform EGD after eloquis washed out.  EGD performed yesterday for melena and a large gastric polyp that was oozing noted. Injected with epinephrine and sprayed with hemospray. Bleeding stopped. Hb stable .   Plan  1. Suggest outpatient follow up with her primary gastroenterologist who has resected prior gastric polyps. The present one which was bleeding would need resection as an out patient. I did not wish to resect it yesterday as it would complicate the picture if she had further bleeding.   I will sign off.  Please call me if any further GI concerns or questions.  We would like to thank you for the opportunity to participate in the care of Deborah Obrien.    LOS: 8 days   Deborah Bellows, MD 04/13/2018, 11:28 AM

## 2018-04-13 NOTE — Progress Notes (Signed)
Pharmacy Electrolyte Monitoring Consult:  Pharmacy consulted to assist in monitoring and replacing electrolytes and glucose management in this 77 y.o. female admitted on 04/04/2018 with Hematemesis and Abdominal Pain   Labs:  Sodium (mmol/L)  Date Value  04/13/2018 143  10/16/2014 141   Potassium (mmol/L)  Date Value  04/13/2018 3.1 (L)  10/16/2014 4.6   Magnesium (mg/dL)  Date Value  04/13/2018 1.6 (L)   Phosphorus (mg/dL)  Date Value  04/13/2018 2.9   Calcium (mg/dL)  Date Value  04/13/2018 7.4 (L)   Calcium, Total (mg/dL)  Date Value  10/16/2014 8.5   Albumin (g/dL)  Date Value  04/13/2018 2.0 (L)  10/29/2012 3.9    Assessment/Plan: 1. Electrolytes: magnesium 4g IV x 1 and potassium 68mEq VT x 2. Goal potassium ~ 4 and goal magnesium ~ 2.   Will recheck electrolytes with am labs.   Pharmacy will continue to monitor and adjust per consult.   MLS 04/13/2018 6:08 PM

## 2018-04-14 LAB — GLUCOSE, CAPILLARY
Glucose-Capillary: 137 mg/dL — ABNORMAL HIGH (ref 70–99)
Glucose-Capillary: 156 mg/dL — ABNORMAL HIGH (ref 70–99)
Glucose-Capillary: 161 mg/dL — ABNORMAL HIGH (ref 70–99)
Glucose-Capillary: 182 mg/dL — ABNORMAL HIGH (ref 70–99)
Glucose-Capillary: 187 mg/dL — ABNORMAL HIGH (ref 70–99)
Glucose-Capillary: 191 mg/dL — ABNORMAL HIGH (ref 70–99)
Glucose-Capillary: 221 mg/dL — ABNORMAL HIGH (ref 70–99)

## 2018-04-14 LAB — MAGNESIUM: MAGNESIUM: 2.2 mg/dL (ref 1.7–2.4)

## 2018-04-14 LAB — BASIC METABOLIC PANEL
Anion gap: 9 (ref 5–15)
BUN: 44 mg/dL — ABNORMAL HIGH (ref 8–23)
CHLORIDE: 107 mmol/L (ref 98–111)
CO2: 26 mmol/L (ref 22–32)
Calcium: 7.9 mg/dL — ABNORMAL LOW (ref 8.9–10.3)
Creatinine, Ser: 1.49 mg/dL — ABNORMAL HIGH (ref 0.44–1.00)
GFR calc non Af Amer: 33 mL/min — ABNORMAL LOW (ref >60)
GFR, EST AFRICAN AMERICAN: 38 mL/min — AB (ref >60)
Glucose, Bld: 252 mg/dL — ABNORMAL HIGH (ref 70–99)
POTASSIUM: 3.7 mmol/L (ref 3.5–5.1)
SODIUM: 142 mmol/L (ref 135–145)

## 2018-04-14 LAB — PHOSPHORUS: PHOSPHORUS: 3.4 mg/dL (ref 2.5–4.6)

## 2018-04-14 MED ORDER — POTASSIUM CHLORIDE 20 MEQ/15ML (10%) PO SOLN
40.0000 meq | Freq: Once | ORAL | Status: DC
  Filled 2018-04-14: qty 30

## 2018-04-14 MED ORDER — BUSPIRONE HCL 10 MG PO TABS
10.0000 mg | ORAL_TABLET | Freq: Two times a day (BID) | ORAL | Status: DC
  Administered 2018-04-14 – 2018-04-23 (×17): 10 mg via ORAL
  Filled 2018-04-14: qty 1
  Filled 2018-04-14: qty 2
  Filled 2018-04-14: qty 1
  Filled 2018-04-14: qty 2
  Filled 2018-04-14: qty 1
  Filled 2018-04-14: qty 2
  Filled 2018-04-14 (×6): qty 1
  Filled 2018-04-14: qty 2
  Filled 2018-04-14: qty 1
  Filled 2018-04-14 (×2): qty 2
  Filled 2018-04-14 (×8): qty 1

## 2018-04-14 MED ORDER — POTASSIUM CHLORIDE CRYS ER 20 MEQ PO TBCR
40.0000 meq | EXTENDED_RELEASE_TABLET | Freq: Once | ORAL | Status: AC
  Administered 2018-04-14: 40 meq via ORAL
  Filled 2018-04-14: qty 2

## 2018-04-14 MED ORDER — TRAZODONE HCL 50 MG PO TABS
50.0000 mg | ORAL_TABLET | Freq: Every day | ORAL | Status: DC
  Administered 2018-04-14 – 2018-04-22 (×8): 50 mg via ORAL
  Filled 2018-04-14 (×8): qty 1

## 2018-04-14 MED ORDER — AMIODARONE IV BOLUS ONLY 150 MG/100ML
150.0000 mg | Freq: Once | INTRAVENOUS | Status: AC
  Administered 2018-04-14: 150 mg via INTRAVENOUS
  Filled 2018-04-14: qty 100

## 2018-04-14 MED ORDER — DOCUSATE SODIUM 100 MG PO CAPS
100.0000 mg | ORAL_CAPSULE | Freq: Every day | ORAL | Status: DC
  Administered 2018-04-14 – 2018-04-20 (×6): 100 mg via ORAL
  Filled 2018-04-14 (×6): qty 1

## 2018-04-14 NOTE — Progress Notes (Signed)
PT Cancellation Note  Patient Details Name: Deborah Obrien MRN: [id number] DOB: 05-Feb-1941   Cancelled Treatment:    Reason Eval/Treat Not Completed: Patient not medically ready  Pt's HR has been consistently elevated today with low BP.  While talking with pt and nurse she was consistently breeching 130s BPM, nursing states (and PT agrees) that pt is not ready at this point, may check back this afternoon if vitals are more stable/appropriate.  Kreg Shropshire, DPT 04/14/2018, 10:53 AM

## 2018-04-14 NOTE — Progress Notes (Signed)
Pharmacy Antibiotic Note  Deborah Obrien is a 77 y.o. female admitted on 04/04/2018 with pneumonia.  Pharmacy has been consulted for cefazolin dosing. Patient previously received Unasyn and Vancomycin.  Plan: Continue cefazolin 1g IV Q8hr.   Height: 5\' 6"  (167.6 cm) Weight: 279 lb 5.2 oz (126.7 kg) IBW/kg (Calculated) : 59.3  Temp (24hrs), Avg:98.1 F (36.7 C), Min:97.7 F (36.5 C), Max:98.3 F (36.8 C)  Recent Labs  Lab 04/09/18 0403 04/09/18 0546 04/10/18 0442 04/11/18 0312 04/11/18 1848 04/12/18 0129 04/13/18 0521 04/14/18 0906  WBC  --   --  11.3* 10.4 7.5 8.5 7.5  --   CREATININE  --   --  1.80* 1.54*  --  1.62* 1.47* 1.49*  LATICACIDVEN 1.9 1.4  --   --   --   --   --   --     Estimated Creatinine Clearance: 43.1 mL/min (A) (by C-G formula based on SCr of 1.49 mg/dL (H)).    Allergies  Allergen Reactions  . Cephalosporins Other (See Comments)    Reaction:  Unknown   . Macrolides And Ketolides Other (See Comments)    Reaction:  Unknown   . Meperidine Shortness Of Breath, Nausea Only and Other (See Comments)    Reaction:  Stomach pain   . Prednisone Anaphylaxis and Other (See Comments)    Reaction:  Increases pts blood sugar  Pt states that she is allergic to all steroids.    . Shellfish Allergy Anaphylaxis, Shortness Of Breath, Diarrhea, Nausea Only and Rash    Reaction:  Stomach pain   . Sulfa Antibiotics Shortness Of Breath, Diarrhea, Nausea Only and Other (See Comments)    Reaction:  Stomach pain   . Uloric [Febuxostat] Anaphylaxis    Locks pt's body up   . Aspirin Other (See Comments)    Reaction:  Stomach pain   . Celecoxib Other (See Comments)    Reaction:  GI bleed, weakness, and stomach pain.    . Cephalexin Diarrhea, Nausea Only and Other (See Comments)    Reaction:  Stomach pain   . Erythromycin Diarrhea, Nausea Only and Other (See Comments)    Reaction:  Stomach pain   . Hydromorphone Other (See Comments)    Reaction:  Hypotension   .  Iodinated Diagnostic Agents Rash and Other (See Comments)    Pt states that she is unable to have because she has chronic kidney disease.    Marland Kitchen Oxycodone Other (See Comments)    Reaction:  Stomach pain   . Atorvastatin Nausea Only and Other (See Comments)    Reaction:  Weakness   . Codeine Diarrhea, Nausea Only and Other (See Comments)    Reaction:  Stomach pain Pt tolerates morphine   . Doxycycline     Stomach pain   . Tape Other (See Comments)    Reaction:  Causes pts skin to tear  Pt states that she is able to use paper tape.    . Valdecoxib Swelling and Other (See Comments)    Pt states that her hands and feet swell.    . Iodine Rash    Antimicrobials this admission: Zosyn 7/29 x 1  Unasyn 7/29 >> 8/1 Vancomycin 7/29 >> 7/31 Cefazolin 8/1 >>  Dose adjustments this admission: N/A  Microbiology results: 7/29 Sputum: klebsiella 7/26  MRSA PCR: negative  8/1 Cdiff Scan: negative   Thank you for allowing pharmacy to be a part of this patient's care.  Pernell Dupre, PharmD, BCPS Clinical Pharmacist 04/14/2018  12:50 PM

## 2018-04-14 NOTE — Progress Notes (Signed)
Pharmacy Electrolyte Monitoring Consult:  Pharmacy consulted to assist in monitoring and replacing electrolytes and glucose management in this 77 y.o. female admitted on 04/04/2018 with Hematemesis and Abdominal Pain   Labs:  Sodium (mmol/L)  Date Value  04/14/2018 142  10/16/2014 141   Potassium (mmol/L)  Date Value  04/14/2018 3.7  10/16/2014 4.6   Magnesium (mg/dL)  Date Value  04/14/2018 2.2   Phosphorus (mg/dL)  Date Value  04/14/2018 3.4   Calcium (mg/dL)  Date Value  04/14/2018 7.9 (L)   Calcium, Total (mg/dL)  Date Value  10/16/2014 8.5   Albumin (g/dL)  Date Value  04/13/2018 2.0 (L)  10/29/2012 3.9    Assessment/Plan: 1. Electrolytes: magnesium 4g IV x 1 and potassium 64mEq VT x 2. Goal potassium ~ 4 and goal magnesium ~ 2.   Will recheck electrolytes with am labs.   04/14/2018 09:06 K 3.7, Ca 7.9, phos 3.4, Mg 2.2. Will give additional potassium 40 mEq VT x 1 since goal is >4. Recheck all electrolytes tomorrow with AM labs.  Pharmacy will continue to monitor and adjust per consult.   Ark Agrusa A. [place], [place].D., BCPS Clinical Pharmacist 04/14/2018 10:00 AM

## 2018-04-14 NOTE — Evaluation (Deleted)
Physical Therapy Evaluation Patient Details Name: Deborah Obrien MRN: [id number] DOB: [birth year] Today's Date: 04/14/2018   History of Present Illness  77 year old female with past medical history of paroxysmal atrial fibrillation status post pacemaker, previous history of TIA, hypertension, morbid obesity, diabetes, chronic kidney disease stage III, anxiety, GERD who presented to the hospital due to abdominal pain and noted to have partial small bowel obstruction.  Pt was extubated 04/13/18.  Clinical Impression  Pt's HR continues to be elevated 100-110 at rest as well as continued hypotension (84/69 at beginning of session). Initial plan was to get to recliner but given the issues with her vitals PT and nursing agree that gentle supine exercises are appropriate today but not a lot of mobility. She and husband agree and she was motivated to do what she could with PT regarding supine exercises.  Pt had inconsistently elevated HR during ~15 minutes of exercises apart from exam that reached 130s, O2 remained mid/upper 90s on 2 liters O2.  Pt feeling weak and fatigued quickly with bouts of 10 exercises, given rest breaks between rounds of exercises.  Pt not at all near her baseline and STR will likely be the only safe option on discharge unless very significant improvements happen before d/c.       Follow Up Recommendations SNF    Equipment Recommendations  None recommended by PT    Recommendations for Other Services       Precautions / Restrictions Precautions Precautions: Fall Restrictions Weight Bearing Restrictions: No      Mobility  Bed Mobility Overal bed mobility: (deferred bed mobility secondary to BP and HR)                Transfers                    Ambulation/Gait                Stairs            Wheelchair Mobility    Modified Rankin (Stroke Patients Only)       Balance                                              Pertinent Vitals/Pain      Home Living Family/patient expects to be discharged to:: Private residence Living Arrangements: Spouse/significant other Available Help at Discharge: Family Type of Home: House Home Access: Ramped entrance     Home Layout: One level Home Equipment: Environmental consultant - 4 wheels;Cane - single point;Tub bench;Bedside commode;Grab bars - tub/shower Additional Comments: Lift recliner (pt sleeps in recliner)    Prior Function Level of Independence: Independent with assistive device(s)         Comments: Uses rollator for community ambulation. SPC/Furniture walks at home.     Hand Dominance        Extremity/Trunk Assessment   Upper Extremity Assessment Upper Extremity Assessment: Generalized weakness    Lower Extremity Assessment Lower Extremity Assessment: Generalized weakness       Communication   Communication: No difficulties  Cognition Arousal/Alertness: Awake/alert Behavior During Therapy: WFL for tasks assessed/performed Overall Cognitive Status: Within Functional Limits for tasks assessed  General Comments General comments (skin integrity, edema, etc.): after discussing vitals/status with nursing     Exercises General Exercises - Lower Extremity Ankle Circles/Pumps: Strengthening;10 reps Quad Sets: Strengthening;10 reps Gluteal Sets: Strengthening;10 reps Short Arc Quad: Strengthening;10 reps Heel Slides: Strengthening;10 reps Hip ABduction/ADduction: AROM;Strengthening;10 reps Straight Leg Raises: AAROM;AROM(AAROM on R, AROM L)   Assessment/Plan    PT Assessment Patient needs continued PT services  PT Problem List Decreased strength;Decreased range of motion;Decreased activity tolerance;Decreased balance;Decreased mobility;Decreased coordination;Decreased knowledge of use of DME;Decreased safety awareness;Decreased knowledge of precautions;Cardiopulmonary status limiting  activity       PT Treatment Interventions Gait training;DME instruction;Functional mobility training;Therapeutic activities;Therapeutic exercise;Balance training;Neuromuscular re-education;Cognitive remediation;Wheelchair mobility training    PT Goals (Current goals can be found in the Care Plan section)       Frequency Min 2X/week   Barriers to discharge        Co-evaluation               AM-PAC PT "6 Clicks" Daily Activity  Outcome Measure Difficulty turning over in bed (including adjusting bedclothes, sheets and blankets)?: A Lot Difficulty moving from lying on back to sitting on the side of the bed? : Unable Difficulty sitting down on and standing up from a chair with arms (e.g., wheelchair, bedside commode, etc,.)?: Unable Help needed moving to and from a bed to chair (including a wheelchair)?: Total Help needed walking in hospital room?: Total Help needed climbing 3-5 steps with a railing? : Total 6 Click Score: 7    End of Session Equipment Utilized During Treatment: Oxygen(2 liters, pt does not use O2 at baseline) Activity Tolerance: Patient limited by fatigue Patient left: with bed alarm set;with call bell/phone within reach;with family/visitor present   PT Visit Diagnosis: Muscle weakness (generalized) (M62.81);Difficulty in walking, not elsewhere classified (R26.2)    Time:  -      Charges:   PT Evaluation $PT Eval Low Complexity: 1 Low PT Treatments $Therapeutic Exercise: 8-22 mins        Kreg Shropshire, DPT 04/14/2018, 4:44 PM

## 2018-04-14 NOTE — Progress Notes (Signed)
CRITICAL CARE NOTE  CC  follow up respiratory failure secondary to aspiration pneumonia with upper GI bleed and SBO.  SUBJECTIVE  77 year old female with respiratory failure secondary to aspiration pneumonia. Patient was successfully extubated yesterday, is now status post EGD with injection of the gastric polyps. She did very well yesterday, progressing diet, awake alert and communicating and in no distress and she did complain of difficulty sleeping last night   SIGNIFICANT EVENTS  Hemoglobin has stayed in the 9's over the past 24 hours. No further blood transfusions needed.   BP (!) 109/58   Pulse (!) 116   Temp 98.1 F (36.7 C) (Axillary)   Resp 12   Ht 5\' 6"  (1.676 m)   Wt 279 lb 5.2 oz (126.7 kg)   SpO2 100%   BMI 45.08 kg/m    REVIEW OF SYSTEMS  PATIENT IS UNABLE TO PROVIDE COMPLETE REVIEW OF SYSTEM S DUE TO SEVERE CRITICAL ILLNESS AND ENCEPHALOPATHY   PHYSICAL EXAMINATION:  GENERAL:awake, alert, oriented in no acute distress on nasal cannula HEAD: Normocephalic, atraumatic.  EYES: Pupils equal, round, reactive to light.  No scleral icterus.  MOUTH: Moist mucosal membrane. NECK: Supple. No thyromegaly. No nodules. No JVD.  PULMONARY: clear to auscultation CARDIOVASCULAR: S1 and S2. Irregular rate and rhythm. No murmurs, rubs, or gallops.  GASTROINTESTINAL: Soft, nontender, -distended. No masses. Positive bowel sounds. No hepatosplenomegaly.  MUSCULOSKELETAL: +1 pedal edema bilaterally, swelling of bilateral hands.   ASSESSMENT AND PLAN SYNOPSIS  Respiratory failure. Significantly improved, status post extubation now just on nasal cannula with dramatic improvement in respiratory status and resolving aspiration pneumonia.has completed a course of antibiotics  AKI. BUN is 44/creatinine 1.47 on yesterday's labs.patient was 2 L positive yesterday will give additional dose of diuresis  Gastrointestinal bleeding. Hyperplastic polyps, status post injection  Small  bowel obstruction. Spontaneous resolution, presently tolerating diet and having bowel movements  Delirium. Resolved  CARDIAC -atrial fibrillation: continue IV amiodarone  Presently completing a course of Ancef for Klebsiella pneumonia  Diabetes mellitus. On sliding scale DVT: SCD GI PRX: pantoprazole  TRANSFUSIONS AS NEEDED MONITOR FSBS ASSESS the need for LABS as needed   Critical Care Time devoted to patient care services described in this note is 30 minutes.   Hermelinda Dellen, DO  Patient ID: Deborah Obrien, female   DOB: 1941/07/26, 77 y.o.   MRN: [id number]

## 2018-04-14 NOTE — Progress Notes (Signed)
Progress Note   Subjective   Making slow progress.  In sinus yesterday but now back in afib.  No further GI bleeding,  Now extubated  Inpatient Medications    Scheduled Meds: . busPIRone  10 mg Oral BID  . docusate sodium  100 mg Oral Daily  . insulin aspart  0-15 Units Subcutaneous Q4H  . ipratropium-albuterol  3 mL Nebulization Q6H  . mouth rinse  15 mL Mouth Rinse BID  . [START ON 04/15/2018] pantoprazole  40 mg Intravenous Q12H  . sodium chloride flush  10-40 mL Intracatheter Q12H  . traZODone  50 mg Oral QHS   Continuous Infusions: . amiodarone 60 mg/hr (04/14/18 1200)  .  ceFAZolin (ANCEF) IV 1 g (04/14/18 1330)  . pantoprozole (PROTONIX) infusion 8 mg/hr (04/14/18 1200)  . phenylephrine (NEO-SYNEPHRINE) Adult infusion Stopped (04/13/18 1715)   PRN Meds: acetaminophen **OR** acetaminophen, benzocaine-Menthol, dibucaine, metoprolol tartrate, ondansetron **OR** ondansetron (ZOFRAN) IV, phenol, polyvinyl alcohol, sodium chloride flush, witch hazel-glycerin   Vital Signs    Vitals:   04/14/18 1000 04/14/18 1100 04/14/18 1150 04/14/18 1200  BP: 104/73 (!) 83/55 115/75 (!) 90/54  Pulse: (!) 131 (!) 101 (!) 127 (!) 122  Resp: (!) 27 (!) 21 (!) 24 17  Temp:    98.2 F (36.8 C)  TempSrc:    Oral  SpO2: 96% 99% 97% 100%  Weight:      Height:        Intake/Output Summary (Last 24 hours) at 04/14/2018 1344 Last data filed at 04/14/2018 1200 Gross per 24 hour  Intake 1903.32 ml  Output 925 ml  Net 978.32 ml   Filed Weights   04/12/18 0448 04/13/18 0500 04/14/18 0500  Weight: 289 lb 3.9 oz (131.2 kg) 282 lb 10.1 oz (128.2 kg) 279 lb 5.2 oz (126.7 kg)    Telemetry    afib with V rates 100-120s today,  A paced yesterday - Personally Reviewed  Physical Exam   GEN- The patient is ill appearing, alert and oriented x 3 today.   Head- normocephalic, atraumatic Eyes-  Sclera clear, conjunctiva pink Ears- hearing intact Oropharynx- clear Neck- supple, Lungs- decreased  BS at bases, normal work of breathing Heart- tachycardic irregular rhythm  GI- soft  Extremities- no clubbing, cyanosis, + dependant edema  MS- no significant deformity or atrophy Skin- no rash or lesion Psych- euthymic mood, full affect Neuro- strength and sensation are intact   Labs    Chemistry Recent Labs  Lab 04/09/18 0248  04/12/18 0129 04/13/18 0521 04/14/18 0906  NA 152*   < > 146* 143 142  K 5.1   < > 3.7 3.1* 3.7  CL 123*   < > 113* 109 107  CO2 17*   < > 25 29 26   GLUCOSE 184*   < > 233* 144* 252*  BUN 73*   < > 54* 44* 44*  CREATININE 1.96*   < > 1.62* 1.47* 1.49*  CALCIUM 8.4*   < > 7.3* 7.4* 7.9*  PROT 5.8*  --   --   --   --   ALBUMIN 2.9*  --   --  2.0*  --   AST 42*  --   --   --   --   ALT 28  --   --   --   --   ALKPHOS 72  --   --   --   --   BILITOT 1.0  --   --   --   --  GFRNONAA 23*   < > 30* 33* 33*  GFRAA 27*   < > 34* 39* 38*  ANIONGAP 12   < > 8 5 9    < > = values in this interval not displayed.     Hematology Recent Labs  Lab 04/11/18 1848 04/12/18 0129 04/12/18 1627 04/13/18 0521  WBC 7.5 8.5  --  7.5  RBC 2.13* 2.73*  --  2.85*  HGB 7.0* 8.9* 9.8* 9.2*  HCT 21.5* 26.7* 28.7* 27.1*  MCV 100.6* 98.0  --  95.0  MCH 32.7 32.5  --  32.2  MCHC 32.5 33.1  --  33.9  RDW 16.2* 16.5*  --  16.1*  PLT 131* 142*  --  154    Cardiac Enzymes Recent Labs  Lab 04/09/18 0251 04/09/18 0951 04/09/18 1458  TROPONINI 0.08* 0.10* 0.09*   No results for input(s): TROPIPOC in the last 168 hours.      Assessment & Plan    1.  Atrial fibrillation with RVR She did have sinus yesterday but unfortunately is now back in afib. Continue IV amiodarone today.  Could consider switching to oral amiodarone if taking POs tomorrow.  Not currently a candidate for anticoagulation due to GI bleed. Hopefully her afib will improve as her overall medical condition dose.  2. Acute on chronic diastolic dysfunction Keep Is and Os about even Could consider  additional diuresis if BP allows Renal failure appears to be improving.  3. Acute on chronic (stage III) renal failure Keep Is and Os even Could consider additional diuresis if BP allows  4. GI Bleed/ SBO/ respiratory failure/ hypernatremia/ aspiration Per primary team, GI, pulm   Thompson Grayer MD, [facility] 04/14/2018 1:44 PM

## 2018-04-14 NOTE — Progress Notes (Signed)
[place] [facility] [place] NAME: Deborah Obrien    MR#:  [id number]  DATE OF BIRTH:  04-May-1941  SUBJECTIVE:   HemoGlobin stable, patient was extubated yesterday and is doing well.  No further bleeding overnight.  Patient is more awake and alert today.  Remains in A. fib but rate controlled.  REVIEW OF SYSTEMS:    Review of Systems  Constitutional: Negative for chills and fever.  HENT: Negative for congestion and tinnitus.   Eyes: Negative for blurred vision and double vision.  Respiratory: Negative for cough, shortness of breath and wheezing.   Cardiovascular: Negative for chest pain, orthopnea and PND.  Gastrointestinal: Negative for abdominal pain, diarrhea, nausea and vomiting.  Genitourinary: Negative for dysuria and hematuria.  Neurological: Negative for dizziness, sensory change and focal weakness.  All other systems reviewed and are negative.   Nutrition: Carb control Tolerating Diet: yes Tolerating PT: Await Eval.   DRUG ALLERGIES:   Allergies  Allergen Reactions  . Cephalosporins Other (See Comments)    Reaction:  Unknown   . Macrolides And Ketolides Other (See Comments)    Reaction:  Unknown   . Meperidine Shortness Of Breath, Nausea Only and Other (See Comments)    Reaction:  Stomach pain   . Prednisone Anaphylaxis and Other (See Comments)    Reaction:  Increases pts blood sugar  Pt states that she is allergic to all steroids.    . Shellfish Allergy Anaphylaxis, Shortness Of Breath, Diarrhea, Nausea Only and Rash    Reaction:  Stomach pain   . Sulfa Antibiotics Shortness Of Breath, Diarrhea, Nausea Only and Other (See Comments)    Reaction:  Stomach pain   . Uloric [Febuxostat] Anaphylaxis    Locks pt's body up   . Aspirin Other (See Comments)    Reaction:  Stomach pain   . Celecoxib Other (See Comments)    Reaction:  GI bleed, weakness, and stomach pain.    . Cephalexin Diarrhea, Nausea Only and Other (See  Comments)    Reaction:  Stomach pain   . Erythromycin Diarrhea, Nausea Only and Other (See Comments)    Reaction:  Stomach pain   . Hydromorphone Other (See Comments)    Reaction:  Hypotension   . Iodinated Diagnostic Agents Rash and Other (See Comments)    Pt states that she is unable to have because she has chronic kidney disease.    Marland Kitchen Oxycodone Other (See Comments)    Reaction:  Stomach pain   . Atorvastatin Nausea Only and Other (See Comments)    Reaction:  Weakness   . Codeine Diarrhea, Nausea Only and Other (See Comments)    Reaction:  Stomach pain Pt tolerates morphine   . Doxycycline     Stomach pain   . Tape Other (See Comments)    Reaction:  Causes pts skin to tear  Pt states that she is able to use paper tape.    . Valdecoxib Swelling and Other (See Comments)    Pt states that her hands and feet swell.    . Iodine Rash    VITALS:  Blood pressure (!) 90/54, pulse (!) 122, temperature 98.2 F (36.8 C), temperature source Oral, resp. rate 17, height 5\' 6"  (1.676 m), weight 126.7 kg (279 lb 5.2 oz), SpO2 100 %.  PHYSICAL EXAMINATION:   Physical Exam  GENERAL:  77 y.o.-year-old obese patient lying in bed awake and alert and in NAD.  EYES: Pupils equal, round, reactive to  light. No scleral icterus. Extraocular muscles intact.  HEENT: Head atraumatic, normocephalic.  NECK:  Supple, no jugular venous distention. No thyroid enlargement, no tenderness.  LUNGS: Normal breath sounds bilaterally, no wheezing, rales, rhonchi. No use of accessory muscles of respiration.  CARDIOVASCULAR: S1, S2 normal. No murmurs, rubs, or gallops.  ABDOMEN: Soft, NT, ND. Bowel sounds present. No organomegaly or mass.  EXTREMITIES: No cyanosis, clubbing, + 1-2 edema b/l NEUROLOGIC: Cranial nerves II through XII intact, no focal motor or sensory deficits appreciated bilaterally. Globally weak.  PSYCHIATRIC: Alert awake oriented x3.  Follows simple commands. SKIN: No obvious rash, lesion, or  ulcer.   LABORATORY PANEL:   CBC Recent Labs  Lab 04/13/18 0521  WBC 7.5  HGB 9.2*  HCT 27.1*  PLT 154   ------------------------------------------------------------------------------------------------------------------  Chemistries  Recent Labs  Lab 04/09/18 0248  04/14/18 0906  NA 152*   < > 142  K 5.1   < > 3.7  CL 123*   < > 107  CO2 17*   < > 26  GLUCOSE 184*   < > 252*  BUN 73*   < > 44*  CREATININE 1.96*   < > 1.49*  CALCIUM 8.4*   < > 7.9*  MG  --    < > 2.2  AST 42*  --   --   ALT 28  --   --   ALKPHOS 72  --   --   BILITOT 1.0  --   --    < > = values in this interval not displayed.   ------------------------------------------------------------------------------------------------------------------  Cardiac Enzymes Recent Labs  Lab 04/09/18 1458  TROPONINI 0.09*   ------------------------------------------------------------------------------------------------------------------  RADIOLOGY:  No results found.   ASSESSMENT AND PLAN:   77 year old female with past medical history of paroxysmal atrial fibrillation status post pacemaker, previous history of TIA, hypertension, morbid obesity, diabetes, chronic kidney disease stage III, anxiety, GERD who presented to the hospital due to abdominal pain and noted to have partial small bowel obstruction.  1.  Acute on chronic respiratory failure with hypoxia and hypercapnia- due to underlying COPD, morbid obesity, Aspiration Pneumonia. -Patient extubated yesterday and Doing well from the respiratory standpoint.  Patient's sputum cultures were positive for Klebsiella. - cont. IV ancef.  Continue further care as per pulmonary/intensivist.  2.  GI bleed- patient was noted to have some dark/melanotic stools .  Hemoglobin had dropped to 7.0 and therefore patient received 1 unit of packed red blood cells. Hg up to 9.2 today and stable and no bleeding overnight.  -A gastroenterology consult obtained and patient  underwent an upper GI endoscopy which showed a gastric polyp which was bleeding and it was injected.  Bleeding has stopped.   Hemoglobin stable, continue PPI. - Patient tolerating PO well.   3.  Acute partial small bowel obstruction- cause of patient's worsening abdominal pain on admission but now resolved. NO acute issue  4.  Acute blood loss anemia- initially thought to be secondary to Mallory-Weiss tear, but patient has history of Hyperplastic polyps.  Status post upper GI endoscopy yesterday which showed a bleeding gastric polyp which was injected. -Hemoglobin improved posttransfusion stable at 9.2 and stable.  No other acute bleeding overnight.  5.  Atrial fibrillation with rapid ventricular response- she has now converted to normal sinus rhythm, appreciate cardiology input.  Continue amiodarone drip for now.  Holding Eliquis given melena/GI bleed.   6.  Hypernatremia- continue free water flushes, and sodium normalized now.  7.  Aspiration  pneumonia- pt. Was on IV Unasyn.  Urine cultures were positive for Klebsiella and its pansensitive.   - cont. Ancef.   8. Hyperkalemia - due to Acute on Chronic renal failure.  -Resolved  Await PT eval.   All the records are reviewed and case discussed with Care Management/Social Worker. Management plans discussed with the patient, family and they are in agreement.  CODE STATUS: Full code  DVT Prophylaxis: Ted's & SCD's.   TOTAL TIME TAKING CARE OF THIS PATIENT: 30 minutes.   POSSIBLE D/C unclear, DEPENDING ON CLINICAL CONDITION and course.   Henreitta Leber M.D on 04/14/2018 at 12:49 PM  Between 7am to 6pm - Pager - [phone]  After 6pm go to [email or web address] - Technical brewer [facility]  Office  [phone]  CC: Primary care physician; [name], MD

## 2018-04-14 NOTE — Evaluation (Signed)
Physical Therapy Evaluation Patient Details Name: Deborah Obrien MRN: [id number] DOB: [birth year] Today's Date: 04/14/2018   History of Present Illness  77 year old female with past medical history of paroxysmal atrial fibrillation status post pacemaker, previous history of TIA, hypertension, morbid obesity, diabetes, chronic kidney disease stage III, anxiety, GERD who presented to the hospital due to abdominal pain and noted to have partial small bowel obstruction.  Pt was extubated 04/13/18.  Clinical Impression  Pt's HR continues to be elevated 100-110 at rest as well as continued hypotension (84/69 at beginning of session). Initial plan was to get to recliner but given the issues with her vitals PT and nursing agree that gentle supine exercises are appropriate today but not a lot of mobility. She and husband agree and she was motivated to do what she could with PT regarding supine exercises.  Pt had inconsistently elevated HR during ~15 minutes of exercises apart from exam that reached 130s, O2 remained mid/upper 90s on 2 liters O2.  Pt feeling weak and fatigued quickly with bouts of 10 exercises, given rest breaks between rounds of exercises.  Pt not at all near her baseline and STR will likely be the only safe option on discharge unless very significant improvements happen before d/c.       Follow Up Recommendations SNF    Equipment Recommendations  None recommended by PT    Recommendations for Other Services       Precautions / Restrictions Precautions Precautions: Fall Restrictions Weight Bearing Restrictions: No      Mobility  Bed Mobility Overal bed mobility: (deferred bed mobility secondary to BP and HR)                Transfers                    Ambulation/Gait                Stairs            Wheelchair Mobility    Modified Rankin (Stroke Patients Only)       Balance                                              Pertinent Vitals/Pain      Home Living Family/patient expects to be discharged to:: Private residence Living Arrangements: Spouse/significant other Available Help at Discharge: Family Type of Home: House Home Access: Ramped entrance     Home Layout: One level Home Equipment: Environmental consultant - 4 wheels;Cane - single point;Tub bench;Bedside commode;Grab bars - tub/shower Additional Comments: Lift recliner (pt sleeps in recliner)    Prior Function Level of Independence: Independent with assistive device(s)         Comments: Uses rollator for community ambulation. SPC/Furniture walks at home.     Hand Dominance        Extremity/Trunk Assessment   Upper Extremity Assessment Upper Extremity Assessment: Generalized weakness    Lower Extremity Assessment Lower Extremity Assessment: Generalized weakness       Communication   Communication: No difficulties  Cognition Arousal/Alertness: Awake/alert Behavior During Therapy: WFL for tasks assessed/performed Overall Cognitive Status: Within Functional Limits for tasks assessed  General Comments General comments (skin integrity, edema, etc.): after discussing vitals/status with nursing     Exercises General Exercises - Lower Extremity Ankle Circles/Pumps: Strengthening;10 reps Quad Sets: Strengthening;10 reps Gluteal Sets: Strengthening;10 reps Short Arc Quad: Strengthening;10 reps Heel Slides: Strengthening;10 reps Hip ABduction/ADduction: AROM;Strengthening;10 reps Straight Leg Raises: AAROM;AROM(AAROM on R, AROM L)   Assessment/Plan    PT Assessment Patient needs continued PT services  PT Problem List Decreased strength;Decreased range of motion;Decreased activity tolerance;Decreased balance;Decreased mobility;Decreased coordination;Decreased knowledge of use of DME;Decreased safety awareness;Decreased knowledge of precautions;Cardiopulmonary status limiting  activity       PT Treatment Interventions Gait training;DME instruction;Functional mobility training;Therapeutic activities;Therapeutic exercise;Balance training;Neuromuscular re-education;Cognitive remediation;Wheelchair mobility training    PT Goals (Current goals can be found in the Care Plan section)  Acute Rehab PT Goals Patient Stated Goal: get back to walking  PT Goal Formulation: With patient Time For Goal Achievement: 04/28/18 Potential to Achieve Goals: Fair    Frequency Min 2X/week   Barriers to discharge        Co-evaluation               AM-PAC PT "6 Clicks" Daily Activity  Outcome Measure Difficulty turning over in bed (including adjusting bedclothes, sheets and blankets)?: A Lot Difficulty moving from lying on back to sitting on the side of the bed? : Unable Difficulty sitting down on and standing up from a chair with arms (e.g., wheelchair, bedside commode, etc,.)?: Unable Help needed moving to and from a bed to chair (including a wheelchair)?: Total Help needed walking in hospital room?: Total Help needed climbing 3-5 steps with a railing? : Total 6 Click Score: 7    End of Session Equipment Utilized During Treatment: Oxygen(2 liters, pt does not use O2 at baseline) Activity Tolerance: Patient limited by fatigue Patient left: with bed alarm set;with call bell/phone within reach;with family/visitor present   PT Visit Diagnosis: Muscle weakness (generalized) (M62.81);Difficulty in walking, not elsewhere classified (R26.2)    Time: 5885-0277 PT Time Calculation (min) (ACUTE ONLY): 28 min   Charges:   PT Evaluation $PT Eval Low Complexity: 1 Low PT Treatments $Therapeutic Exercise: 8-22 mins        Kreg Shropshire, DPT 04/14/2018, 5:05 PM

## 2018-04-15 LAB — CBC WITH DIFFERENTIAL/PLATELET
BASOS ABS: 0 10*3/uL (ref 0–0.1)
BASOS PCT: 0 %
Eosinophils Absolute: 0.1 10*3/uL (ref 0–0.7)
Eosinophils Relative: 1 %
HEMATOCRIT: 27.4 % — AB (ref 35.0–47.0)
Hemoglobin: 9.5 g/dL — ABNORMAL LOW (ref 12.0–16.0)
Lymphocytes Relative: 6 %
Lymphs Abs: 0.6 10*3/uL — ABNORMAL LOW (ref 1.0–3.6)
MCH: 33.2 pg (ref 26.0–34.0)
MCHC: 34.7 g/dL (ref 32.0–36.0)
MCV: 95.7 fL (ref 80.0–100.0)
Monocytes Absolute: 1 10*3/uL — ABNORMAL HIGH (ref 0.2–0.9)
Monocytes Relative: 10 %
NEUTROS ABS: 8.9 10*3/uL — AB (ref 1.4–6.5)
Neutrophils Relative %: 83 %
Platelets: 177 10*3/uL (ref 150–440)
RBC: 2.87 MIL/uL — ABNORMAL LOW (ref 3.80–5.20)
RDW: 16.1 % — AB (ref 11.5–14.5)
WBC: 10.7 10*3/uL (ref 3.6–11.0)

## 2018-04-15 LAB — BASIC METABOLIC PANEL
Anion gap: 10 (ref 5–15)
BUN: 33 mg/dL — AB (ref 8–23)
CO2: 25 mmol/L (ref 22–32)
CREATININE: 1.51 mg/dL — AB (ref 0.44–1.00)
Calcium: 8 mg/dL — ABNORMAL LOW (ref 8.9–10.3)
Chloride: 108 mmol/L (ref 98–111)
GFR calc Af Amer: 37 mL/min — ABNORMAL LOW (ref >60)
GFR calc non Af Amer: 32 mL/min — ABNORMAL LOW (ref >60)
Glucose, Bld: 131 mg/dL — ABNORMAL HIGH (ref 70–99)
Potassium: 3.8 mmol/L (ref 3.5–5.1)
Sodium: 143 mmol/L (ref 135–145)

## 2018-04-15 LAB — GLUCOSE, CAPILLARY
Glucose-Capillary: 101 mg/dL — ABNORMAL HIGH (ref 70–99)
Glucose-Capillary: 119 mg/dL — ABNORMAL HIGH (ref 70–99)
Glucose-Capillary: 137 mg/dL — ABNORMAL HIGH (ref 70–99)
Glucose-Capillary: 146 mg/dL — ABNORMAL HIGH (ref 70–99)
Glucose-Capillary: 148 mg/dL — ABNORMAL HIGH (ref 70–99)
Glucose-Capillary: 159 mg/dL — ABNORMAL HIGH (ref 70–99)
Glucose-Capillary: 208 mg/dL — ABNORMAL HIGH (ref 70–99)

## 2018-04-15 LAB — PHOSPHORUS: Phosphorus: 3.8 mg/dL (ref 2.5–4.6)

## 2018-04-15 LAB — MAGNESIUM: Magnesium: 2.1 mg/dL (ref 1.7–2.4)

## 2018-04-15 MED ORDER — INSULIN ASPART 100 UNIT/ML ~~LOC~~ SOLN
0.0000 [IU] | Freq: Every day | SUBCUTANEOUS | Status: DC
  Administered 2018-04-16: 3 [IU] via SUBCUTANEOUS
  Administered 2018-04-17: 4 [IU] via SUBCUTANEOUS
  Administered 2018-04-21 – 2018-04-22 (×2): 3 [IU] via SUBCUTANEOUS
  Filled 2018-04-15 (×5): qty 1

## 2018-04-15 MED ORDER — PAROXETINE HCL 20 MG PO TABS
20.0000 mg | ORAL_TABLET | Freq: Every day | ORAL | Status: DC
  Administered 2018-04-16 – 2018-04-23 (×8): 20 mg via ORAL
  Filled 2018-04-15 (×9): qty 1

## 2018-04-15 MED ORDER — POTASSIUM CHLORIDE 10 MEQ/100ML IV SOLN
10.0000 meq | INTRAVENOUS | Status: AC
  Administered 2018-04-15 (×4): 10 meq via INTRAVENOUS
  Filled 2018-04-15 (×4): qty 100

## 2018-04-15 MED ORDER — PANTOPRAZOLE SODIUM 40 MG PO TBEC
40.0000 mg | DELAYED_RELEASE_TABLET | Freq: Two times a day (BID) | ORAL | Status: DC
  Administered 2018-04-16 – 2018-04-23 (×14): 40 mg via ORAL
  Filled 2018-04-15 (×15): qty 1

## 2018-04-15 MED ORDER — MORPHINE SULFATE (PF) 2 MG/ML IV SOLN
2.0000 mg | Freq: Once | INTRAVENOUS | Status: AC
  Administered 2018-04-15: 2 mg via INTRAVENOUS
  Filled 2018-04-15: qty 1

## 2018-04-15 MED ORDER — POTASSIUM CHLORIDE CRYS ER 20 MEQ PO TBCR: 40.0000 meq | EXTENDED_RELEASE_TABLET | Freq: Once | ORAL | Status: DC

## 2018-04-15 MED ORDER — FUROSEMIDE 10 MG/ML IJ SOLN
40.0000 mg | Freq: Once | INTRAMUSCULAR | Status: AC
  Administered 2018-04-15: 40 mg via INTRAVENOUS
  Filled 2018-04-15: qty 4

## 2018-04-15 MED ORDER — INSULIN ASPART 100 UNIT/ML ~~LOC~~ SOLN
0.0000 [IU] | Freq: Three times a day (TID) | SUBCUTANEOUS | Status: DC
  Administered 2018-04-15: 3 [IU] via SUBCUTANEOUS
  Administered 2018-04-16: 8 [IU] via SUBCUTANEOUS
  Administered 2018-04-16 (×2): 3 [IU] via SUBCUTANEOUS
  Administered 2018-04-17: 5 [IU] via SUBCUTANEOUS
  Administered 2018-04-17 (×2): 8 [IU] via SUBCUTANEOUS
  Administered 2018-04-18 – 2018-04-19 (×3): 11 [IU] via SUBCUTANEOUS
  Administered 2018-04-19: 8 [IU] via SUBCUTANEOUS
  Administered 2018-04-20: 5 [IU] via SUBCUTANEOUS
  Administered 2018-04-20 (×2): 3 [IU] via SUBCUTANEOUS
  Administered 2018-04-21 (×2): 11 [IU] via SUBCUTANEOUS
  Administered 2018-04-21: 9 [IU] via SUBCUTANEOUS
  Administered 2018-04-22: 8 [IU] via SUBCUTANEOUS
  Administered 2018-04-22: 3 [IU] via SUBCUTANEOUS
  Administered 2018-04-22: 5 [IU] via SUBCUTANEOUS
  Administered 2018-04-23: 8 [IU] via SUBCUTANEOUS
  Administered 2018-04-23: 15 [IU] via SUBCUTANEOUS
  Filled 2018-04-15 (×21): qty 1

## 2018-04-15 NOTE — Progress Notes (Signed)
Report given to Manuela Schwartz, RN on 2A. Patient to be transferred to Room 250.

## 2018-04-15 NOTE — Progress Notes (Signed)
Progress Note   Subjective   She has transferred out of ICU.  She is currently sleeping but rouses.  In and out of afib.  She denies CP or SOB.  No new concerns  Inpatient Medications    Scheduled Meds: . busPIRone  10 mg Oral BID  . docusate sodium  100 mg Oral Daily  . insulin aspart  0-15 Units Subcutaneous TID WC  . insulin aspart  0-5 Units Subcutaneous QHS  . ipratropium-albuterol  3 mL Nebulization Q6H  . mouth rinse  15 mL Mouth Rinse BID  . pantoprazole  40 mg Oral BID AC  . PARoxetine  20 mg Oral Daily  . sodium chloride flush  10-40 mL Intracatheter Q12H  . traZODone  50 mg Oral QHS   Continuous Infusions: . amiodarone 30 mg/hr (04/15/18 0655)  .  ceFAZolin (ANCEF) IV Stopped (04/15/18 0745)  . potassium chloride     PRN Meds: acetaminophen **OR** acetaminophen, benzocaine-Menthol, dibucaine, metoprolol tartrate, ondansetron **OR** ondansetron (ZOFRAN) IV, phenol, polyvinyl alcohol, sodium chloride flush, witch hazel-glycerin   Vital Signs    Vitals:   04/15/18 0811 04/15/18 0900 04/15/18 1100 04/15/18 1200  BP:   (!) 126/40 (!) 125/53  Pulse: 76  77 (!) 125  Resp: 19  (!) 27 18  Temp:  98 F (36.7 C)    TempSrc:  Oral    SpO2: 98%  98% 95%  Weight:      Height:        Intake/Output Summary (Last 24 hours) at 04/15/2018 1508 Last data filed at 04/15/2018 1046 Gross per 24 hour  Intake 1165.15 ml  Output 1250 ml  Net -84.85 ml   Filed Weights   04/13/18 0500 04/14/18 0500 04/15/18 0500  Weight: 282 lb 10.1 oz (128.2 kg) 279 lb 5.2 oz (126.7 kg) 279 lb 8.7 oz (126.8 kg)    Telemetry    Atrial paced rhythm primarily overnight, though he more recently converted back to afib with RVR. - Personally Reviewed  Physical Exam   GEN- The patient is chronically ill, overweight appearing, sleeping but rouses Head- normocephalic, atraumatic Eyes-  Sclera clear, conjunctiva pink Ears- hearing intact Oropharynx- clear Neck- supple, Lungs- Clear to  ausculation bilaterally, normal work of breathing Heart- tachycardic irregular rhythm  GI- soft, NT, ND, + BS Extremities- no clubbing, cyanosis,+ dependant edema  MS- diffuse atrophy Skin- no rash or lesion Psych- euthymic mood, full affect Neuro- strength and sensation are intact   Labs    Chemistry Recent Labs  Lab 04/09/18 0248  04/13/18 0521 04/14/18 0906 04/15/18 0638  NA 152*   < > 143 142 143  K 5.1   < > 3.1* 3.7 3.8  CL 123*   < > 109 107 108  CO2 17*   < > 29 26 25   GLUCOSE 184*   < > 144* 252* 131*  BUN 73*   < > 44* 44* 33*  CREATININE 1.96*   < > 1.47* 1.49* 1.51*  CALCIUM 8.4*   < > 7.4* 7.9* 8.0*  PROT 5.8*  --   --   --   --   ALBUMIN 2.9*  --  2.0*  --   --   AST 42*  --   --   --   --   ALT 28  --   --   --   --   ALKPHOS 72  --   --   --   --   BILITOT 1.0  --   --   --   --  GFRNONAA 23*   < > 33* 33* 32*  GFRAA 27*   < > 39* 38* 37*  ANIONGAP 12   < > 5 9 10    < > = values in this interval not displayed.     Hematology Recent Labs  Lab 04/12/18 0129 04/12/18 1627 04/13/18 0521 04/15/18 0736  WBC 8.5  --  7.5 10.7  RBC 2.73*  --  2.85* 2.87*  HGB 8.9* 9.8* 9.2* 9.5*  HCT 26.7* 28.7* 27.1* 27.4*  MCV 98.0  --  95.0 95.7  MCH 32.5  --  32.2 33.2  MCHC 33.1  --  33.9 34.7  RDW 16.5*  --  16.1* 16.1*  PLT 142*  --  154 177    Cardiac Enzymes Recent Labs  Lab 04/09/18 0251 04/09/18 0951 04/09/18 1458  TROPONINI 0.08* 0.10* 0.09*   No results for input(s): TROPIPOC in the last 168 hours.      Assessment & Plan    1.  Paroxysmal atrial fibrillation with RVR Primarily sinus yesterday, though back in AF with RVR now  She is tolerating this well and should be ok to remain on telemetry floor. Continue IV amiodarone another day.  Convert to oral tomorrow. Not currently a candidate for anticoagulation due to GI bleed I remain optimistic that her afib will improve as her overall medical condition improves.  2. Acute on chronic  diastolic dysfunction Keep Is and Os even Consider diuresis if BP and renal function remain stable  3. Acute on chronic renal (Stage III) dysfunction Stable No change required today  4. GI bleed/SOB/respiratory failure/ hypernatremia/ aspiration pneumonia Per primary team/GI/Pulm  Thompson Grayer MD, [facility] 04/15/2018 3:08 PM

## 2018-04-15 NOTE — Progress Notes (Signed)
Bedside swallow screen completed per MD order. Patient failed, MD notified, patient diet changed to NPO; patient educated on the need to remain NPO until formal evaluation can be completed tomorrow by Astronomer.

## 2018-04-15 NOTE — Progress Notes (Signed)
CRITICAL CARE NOTE  CC  follow up respiratory failure secondary to aspiration pneumonia with upper GI bleed and SBO.  SUBJECTIVE  77 year old female with respiratory failure secondary to aspiration pneumonia. Patient was successfully extubated yesterday, is now status post EGD with injection of the gastric polyps.   She did very well yesterday, progressing diet, awake alert and communicating and in no distress she did complain of cough yesterday and also had difficulty with atrial fibrillation. This morning she converted to normal sinus rhythm  BP 110/67   Pulse (!) 121   Temp 98.2 F (36.8 C) (Oral)   Resp (!) 23   Ht 5\' 6"  (1.676 m)   Wt 279 lb 8.7 oz (126.8 kg)   SpO2 96%   BMI 45.12 kg/m   PHYSICAL EXAMINATION:  GENERAL:awake, alert, oriented in no acute distress on nasal cannula HEAD: Normocephalic, atraumatic.  EYES: Pupils equal, round, reactive to light.  No scleral icterus.  MOUTH: Moist mucosal membrane. NECK: Supple. No thyromegaly. No nodules. No JVD.  PULMONARY: clear to auscultation CARDIOVASCULAR: S1 and S2. Irregular rate and rhythm. No murmurs, rubs, or gallops.  GASTROINTESTINAL: Soft, nontender, -distended. No masses. Positive bowel sounds. No hepatosplenomegaly.  MUSCULOSKELETAL: +1 pedal edema bilaterally, swelling of bilateral hands.   ASSESSMENT AND PLAN SYNOPSIS  Respiratory failure. Significantly improved, status post extubation now just on nasal cannula with dramatic improvement in respiratory status and resolving aspiration pneumonia.has completed a course of antibiotics  AKI. BUN is 33 and creatinine 1.51  Gastrointestinal bleeding. Hyperplastic polyps, status post injection, will repeat CBC today  Small bowel obstruction. Spontaneous resolution, presently tolerating diet and having bowel movements  Delirium. Resolved  CARDIAC -atrial fibrillation: continue IV amiodarone  Presently completing a course of Ancef for Klebsiella  pneumonia  Diabetes mellitus. On sliding scale DVT: SCD GI PRX: pantoprazole  TRANSFUSIONS AS NEEDED MONITOR FSBS ASSESS the need for LABS as needed   At this point patient is doing well, stable hemodynamics, will transfer to cards telemetry  Hermelinda Dellen, DO  Patient ID: Deborah Obrien, female   DOB: Jun 12, 1941, 77 y.o.   MRN: [id number] Patient ID: Deborah Obrien, female   DOB: 11/04/1940, 77 y.o.   MRN: [id number]

## 2018-04-15 NOTE — Progress Notes (Signed)
Pharmacy Electrolyte Monitoring Consult:  Pharmacy consulted to assist in monitoring and replacing electrolytes and glucose management in this 77 y.o. female admitted on 04/04/2018 with Hematemesis and Abdominal Pain   Labs:  Sodium (mmol/L)  Date Value  04/15/2018 143  10/16/2014 141   Potassium (mmol/L)  Date Value  04/15/2018 3.8  10/16/2014 4.6   Magnesium (mg/dL)  Date Value  04/15/2018 2.1   Phosphorus (mg/dL)  Date Value  04/15/2018 3.8   Calcium (mg/dL)  Date Value  04/15/2018 8.0 (L)   Calcium, Total (mg/dL)  Date Value  10/16/2014 8.5   Albumin (g/dL)  Date Value  04/13/2018 2.0 (L)  10/29/2012 3.9    Assessment/Plan: 1. Electrolytes: magnesium 4g IV x 1 and potassium 62mEq VT x 2. Goal potassium ~ 4 and goal magnesium ~ 2.   Will recheck electrolytes with am labs.   04/14/2018 09:06 K 3.7, Ca 7.9, phos 3.4, Mg 2.2. Will give additional potassium 40 mEq VT x 1 since goal is >4. Recheck all electrolytes tomorrow with AM labs.  04/15/2018 06:38 K 3.8, Ca 8, phos 3.8, Mg 2.1. Will give additional Klor-Con 40 mEq po x 1 since goal is >4. Recheck all electrolytes tomorrow with AM labs.   Pharmacy will continue to monitor and adjust per consult.   Kylia Grajales A. Malin, [place].D., BCPS Clinical Pharmacist 04/15/2018 7:24 AM

## 2018-04-15 NOTE — Progress Notes (Signed)
Deborah Obrien at [place] NAME: Deborah Obrien    MR#:  [id number]  DATE OF BIRTH:  [birth year]  SUBJECTIVE:   Patient is more awake and alert today.  Husband is at bedside.  Patient has failed bedside swallow eval and therefore awaiting speech evaluation.  High risk for aspiration.  No other acute events overnight.  Hemoglobin remained stable.  No further acute bleeding.  REVIEW OF SYSTEMS:    Review of Systems  Constitutional: Negative for chills and fever.  HENT: Negative for congestion and tinnitus.   Eyes: Negative for blurred vision and double vision.  Respiratory: Negative for cough, shortness of breath and wheezing.   Cardiovascular: Negative for chest pain, orthopnea and PND.  Gastrointestinal: Negative for abdominal pain, diarrhea, nausea and vomiting.  Genitourinary: Negative for dysuria and hematuria.  Neurological: Negative for dizziness, sensory change and focal weakness.  All other systems reviewed and are negative.   Nutrition: NPO Tolerating Diet: no Tolerating PT: Await Eval.   DRUG ALLERGIES:   Allergies  Allergen Reactions  . Cephalosporins Other (See Comments)    Reaction:  Unknown   . Macrolides And Ketolides Other (See Comments)    Reaction:  Unknown   . Meperidine Shortness Of Breath, Nausea Only and Other (See Comments)    Reaction:  Stomach pain   . Prednisone Anaphylaxis and Other (See Comments)    Reaction:  Increases pts blood sugar  Pt states that she is allergic to all steroids.    . Shellfish Allergy Anaphylaxis, Shortness Of Breath, Diarrhea, Nausea Only and Rash    Reaction:  Stomach pain   . Sulfa Antibiotics Shortness Of Breath, Diarrhea, Nausea Only and Other (See Comments)    Reaction:  Stomach pain   . Uloric [Febuxostat] Anaphylaxis    Locks pt's body up   . Aspirin Other (See Comments)    Reaction:  Stomach pain   . Celecoxib Other (See Comments)    Reaction:  GI bleed, weakness, and  stomach pain.    . Cephalexin Diarrhea, Nausea Only and Other (See Comments)    Reaction:  Stomach pain   . Erythromycin Diarrhea, Nausea Only and Other (See Comments)    Reaction:  Stomach pain   . Hydromorphone Other (See Comments)    Reaction:  Hypotension   . Iodinated Diagnostic Agents Rash and Other (See Comments)    Pt states that she is unable to have because she has chronic kidney disease.    Marland Kitchen Oxycodone Other (See Comments)    Reaction:  Stomach pain   . Atorvastatin Nausea Only and Other (See Comments)    Reaction:  Weakness   . Codeine Diarrhea, Nausea Only and Other (See Comments)    Reaction:  Stomach pain Pt tolerates morphine   . Doxycycline     Stomach pain   . Tape Other (See Comments)    Reaction:  Causes pts skin to tear  Pt states that she is able to use paper tape.    . Valdecoxib Swelling and Other (See Comments)    Pt states that her hands and feet swell.    . Iodine Rash    VITALS:  Blood pressure (!) 125/53, pulse (!) 125, temperature 98 F (36.7 C), temperature source Oral, resp. rate 18, height 5\' 6"  (1.676 m), weight 126.8 kg (279 lb 8.7 oz), SpO2 95 %.  PHYSICAL EXAMINATION:   Physical Exam  GENERAL:  77 y.o.-year-old obese patient lying in bed awake  and alert and in NAD.  EYES: Pupils equal, round, reactive to light. No scleral icterus. Extraocular muscles intact.  HEENT: Head atraumatic, normocephalic.  NECK:  Supple, no jugular venous distention. No thyroid enlargement, no tenderness.  LUNGS: Normal breath sounds bilaterally, no wheezing, rales, rhonchi. No use of accessory muscles of respiration.  CARDIOVASCULAR: S1, S2 normal. No murmurs, rubs, or gallops.  ABDOMEN: Soft, NT, ND. Bowel sounds present. No organomegaly or mass.  EXTREMITIES: No cyanosis, clubbing, + 1-2 edema b/l NEUROLOGIC: Cranial nerves II through XII intact, no focal motor or sensory deficits appreciated bilaterally. Globally weak.  PSYCHIATRIC: Alert awake oriented x 3.   Follows simple commands. SKIN: No obvious rash, lesion, or ulcer.   LABORATORY PANEL:   CBC Recent Labs  Lab 04/15/18 0736  WBC 10.7  HGB 9.5*  HCT 27.4*  PLT 177   ------------------------------------------------------------------------------------------------------------------  Chemistries  Recent Labs  Lab 04/09/18 0248  04/15/18 0638  NA 152*   < > 143  K 5.1   < > 3.8  CL 123*   < > 108  CO2 17*   < > 25  GLUCOSE 184*   < > 131*  BUN 73*   < > 33*  CREATININE 1.96*   < > 1.51*  CALCIUM 8.4*   < > 8.0*  MG  --    < > 2.1  AST 42*  --   --   ALT 28  --   --   ALKPHOS 72  --   --   BILITOT 1.0  --   --    < > = values in this interval not displayed.   ------------------------------------------------------------------------------------------------------------------  Cardiac Enzymes Recent Labs  Lab 04/09/18 1458  TROPONINI 0.09*   ------------------------------------------------------------------------------------------------------------------  RADIOLOGY:  No results found.   ASSESSMENT AND PLAN:   77 year old female with past medical history of paroxysmal atrial fibrillation status post pacemaker, previous history of TIA, hypertension, morbid obesity, diabetes, chronic kidney disease stage III, anxiety, GERD who presented to the hospital due to abdominal pain and noted to have partial small bowel obstruction.  1.  Acute on chronic respiratory failure with hypoxia and hypercapnia- due to underlying COPD, morbid obesity, Aspiration Pneumonia. -Patient extubated 04/13/18 and Doing well from the respiratory standpoint.  Patient's sputum cultures were positive for Klebsiella. cont. IV ancef and can switch to Oral when pt. Can take PO.  2.  GI bleed- patient was noted to have some dark/melanotic stools .  Hemoglobin had dropped to 7.0 and therefore patient received 1 unit of packed red blood cells. Hg up to 9.5 today and stable and no bleeding overnight.  -A  gastroenterology consult obtained and patient underwent an upper GI endoscopy which showed a gastric polyp which was bleeding and it was injected.  Bleeding has stopped.   Hemoglobin stable, continue PPI. - Patient tolerating PO well.   3.  Acute partial small bowel obstruction- cause of patient's worsening abdominal pain on admission but now resolved. NO acute issue  4.  Acute blood loss anemia- initially thought to be secondary to Mallory-Weiss tear, but patient has history of Hyperplastic polyps.  Status post upper GI endoscopy yesterday which showed a bleeding gastric polyp which was injected. -Hemoglobin improved posttransfusion stable at 9.5 and stable.  No other acute bleeding overnight.  5.  Atrial fibrillation with rapid ventricular response- she has now converted to normal sinus rhythm, appreciate cardiology input.  Continue amiodarone drip for now.  Holding Eliquis given melena/GI bleed.  6.  Hypernatremia- continue free water flushes, and sodium normalized now.  7.  Aspiration pneumonia- pt. Was on IV Unasyn.  Sputum cultures were positive for Klebsiella and its pansensitive.   - cont. Ancef and switch to Oral when pt. Can take PO.  - await speech eval as pt. High risk for aspiration.  8. Hyperkalemia - due to Acute on Chronic renal failure.  - Resolved  Await PT eval when tolerated.   All the records are reviewed and case discussed with Care Management/Social Worker. Management plans discussed with the patient, family and they are in agreement.  CODE STATUS: Full code  DVT Prophylaxis: Ted's & SCD's.   TOTAL TIME TAKING CARE OF THIS PATIENT: 30 minutes.   POSSIBLE D/C unclear, DEPENDING ON CLINICAL CONDITION and course.   Henreitta Leber M.D on 04/15/2018 at 1:55 PM  Between 7am to 6pm - Pager - [phone]  After 6pm go to [email or web address] - Technical brewer [facility]  Office  [phone]  CC: Primary care physician; [name], MD

## 2018-04-16 ENCOUNTER — Inpatient Hospital Stay: Payer: Medicare Other

## 2018-04-16 LAB — BASIC METABOLIC PANEL
Anion gap: 16 — ABNORMAL HIGH (ref 5–15)
BUN: 33 mg/dL — ABNORMAL HIGH (ref 8–23)
CALCIUM: 8.3 mg/dL — AB (ref 8.9–10.3)
CO2: 21 mmol/L — AB (ref 22–32)
CREATININE: 1.63 mg/dL — AB (ref 0.44–1.00)
Chloride: 107 mmol/L (ref 98–111)
GFR calc non Af Amer: 29 mL/min — ABNORMAL LOW (ref >60)
GFR, EST AFRICAN AMERICAN: 34 mL/min — AB (ref >60)
GLUCOSE: 204 mg/dL — AB (ref 70–99)
Potassium: 4.9 mmol/L (ref 3.5–5.1)
Sodium: 144 mmol/L (ref 135–145)

## 2018-04-16 LAB — GLUCOSE, CAPILLARY
Glucose-Capillary: 191 mg/dL — ABNORMAL HIGH (ref 70–99)
Glucose-Capillary: 199 mg/dL — ABNORMAL HIGH (ref 70–99)
Glucose-Capillary: 280 mg/dL — ABNORMAL HIGH (ref 70–99)
Glucose-Capillary: 253 mg/dL — ABNORMAL HIGH (ref 70–99)

## 2018-04-16 LAB — PHOSPHORUS: Phosphorus: 4.3 mg/dL (ref 2.5–4.6)

## 2018-04-16 LAB — MAGNESIUM: Magnesium: 2 mg/dL (ref 1.7–2.4)

## 2018-04-16 MED ORDER — ADULT MULTIVITAMIN W/MINERALS CH
1.0000 | ORAL_TABLET | Freq: Every day | ORAL | Status: DC
  Administered 2018-04-16 – 2018-04-23 (×8): 1 via ORAL
  Filled 2018-04-16 (×7): qty 1

## 2018-04-16 MED ORDER — FUROSEMIDE 10 MG/ML IJ SOLN
60.0000 mg | Freq: Three times a day (TID) | INTRAMUSCULAR | Status: DC
  Administered 2018-04-16 – 2018-04-20 (×14): 60 mg via INTRAVENOUS
  Filled 2018-04-16 (×15): qty 6

## 2018-04-16 MED ORDER — ALBUMIN HUMAN 25 % IV SOLN
12.5000 g | Freq: Once | INTRAVENOUS | Status: AC
  Administered 2018-04-16: 12.5 g via INTRAVENOUS
  Filled 2018-04-16 (×2): qty 50

## 2018-04-16 MED ORDER — PREMIER PROTEIN SHAKE
11.0000 [oz_av] | Freq: Two times a day (BID) | ORAL | Status: DC
  Administered 2018-04-17 – 2018-04-22 (×11): 11 [oz_av] via ORAL

## 2018-04-16 MED ORDER — METOPROLOL TARTRATE 5 MG/5ML IV SOLN
5.0000 mg | Freq: Three times a day (TID) | INTRAVENOUS | Status: DC
  Administered 2018-04-16 – 2018-04-17 (×4): 5 mg via INTRAVENOUS
  Filled 2018-04-16 (×4): qty 5

## 2018-04-16 MED ORDER — FUROSEMIDE 10 MG/ML IJ SOLN
20.0000 mg | Freq: Once | INTRAMUSCULAR | Status: AC
  Administered 2018-04-16: 20 mg via INTRAVENOUS

## 2018-04-16 NOTE — Care Management Important Message (Signed)
Copy of signed IM left with patient in room.  

## 2018-04-16 NOTE — Clinical Social Work Note (Signed)
CSW received consult that patient may need SNF, CSW to assess at a later time.  Jones Broom. [place], MSW, [place]  04/16/2018 5:50 PM

## 2018-04-16 NOTE — Progress Notes (Signed)
Nutrition Follow Up Note   DOCUMENTATION CODES:   Obesity unspecified  INTERVENTION:   Premier Protein BID, each supplement provides 160 kcal and 30 grams of protein.   MVI daily  NUTRITION DIAGNOSIS:   Inadequate oral intake related to acute illness as evidenced by meal completion < 25%.  GOAL:   Patient will meet greater than or equal to 90% of their needs  MONITOR:   PO intake, Supplement acceptance, Labs, Weight trends, Skin, I & O's  ASSESSMENT:   77 year old female with PMHx of HTN, paroxysmal A-fib, s/p placement of permanent cardiac pacemaker,  anxiety, acid reflux, fatty liver, OA, hx TIA, DM type 2, CKD, OP, gout, CHF who was admitted on 7/25 with upper GI bleed and SBO (no signs active bleeding and SBO now resolved) who suffered respiratory arrest last night secondary to COPD and aspiration PNA and was intubated early AM of 7/29.   - pt s/p EGD 8/1 found to have gastric polyps   Pt extubated 8/2. Pt seen by SLP today and approved for dysphagia 3 diet. Pt eating 25% of meals. Per chart, pt with 49lb weight gain since admit; pt +20L on her I & Os. Pt recieving lasix. RD will add supplements and MVI to help pt meet her estimated needs.    Medications reviewed and include: colace, lasix, insulin, protonix, cefazolin  Labs reviewed: BUN 33(H), creat 1.63(H), P 4.3 wnl, Mg 2.0 wnl Hgb 9.5(L), Hct 27.4(L) cbgs- 191, 199 x 24 hrs  Diet Order:   Diet Order           DIET DYS 3 Room service appropriate? Yes with Assist; Fluid consistency: Thin  Diet effective now         EDUCATION NEEDS:   No education needs have been identified at this time  Skin:  Skin Assessment: Reviewed RN Assessment(ecchymosis to bilateral arms and legs)  Last BM:  8/2- type 7  Height:   Ht Readings from Last 1 Encounters:  04/06/18 5\' 6"  (1.676 m)    Weight:   Wt Readings from Last 1 Encounters:  04/16/18 292 lb 6.4 oz (132.6 kg)    Ideal Body Weight:  59.1 kg  BMI:  Body  mass index is 47.19 kg/m.   Estimated Nutritional Needs:   Kcal:  1800-2100kcal/day   Protein:  91-114g/day   Fluid:  1.5-1.8L/day or per MD  Koleen Distance MS, RD, LDN Pager #- [phone] Office#- [phone] After Hours Pager: [phone]

## 2018-04-16 NOTE — Progress Notes (Signed)
Physical Therapy Treatment Patient Details Name: MUSKAN BOLLA MRN: [id number] DOB: 07/19/1941 Today's Date: 04/16/2018    History of Present Illness 77 year old female with past medical history of paroxysmal atrial fibrillation status post pacemaker, previous history of TIA, hypertension, morbid obesity, diabetes, chronic kidney disease stage III, anxiety, GERD who presented to the hospital due to abdominal pain and noted to have partial small bowel obstruction.  Pt was extubated 04/13/18.    PT Comments    Pt with HR fluctuating at rest up to 130's.  Pt c/o being hot and generally uncomfortable.  Pt noted to be inc of urine and external cath not working right and needing a full bed change.  Nurse tech in and assisted with care and linen change.  Rolling left and right with mod a x 1 and assist to hold position.  Pt generally fatigued with bed mobility.  Further session held at this time due to HR.   Follow Up Recommendations  SNF     Equipment Recommendations  None recommended by PT    Recommendations for Other Services       Precautions / Restrictions Precautions Precautions: Fall Precaution Comments: HR Restrictions Weight Bearing Restrictions: No    Mobility  Bed Mobility Overal bed mobility: Needs Assistance Bed Mobility: Rolling Rolling: Min assist;Mod assist         General bed mobility comments: left and right for complete bed change  Transfers                 General transfer comment: deferred  Ambulation/Gait                 Stairs             Wheelchair Mobility    Modified Rankin (Stroke Patients Only)       Balance                                            Cognition Arousal/Alertness: Awake/alert Behavior During Therapy: WFL for tasks assessed/performed Overall Cognitive Status: Within Functional Limits for tasks assessed                                        Exercises       General Comments        Pertinent Vitals/Pain Pain Assessment: Faces Faces Pain Scale: Hurts whole lot Pain Location: back, left hip Pain Descriptors / Indicators: Sore;Aching;Grimacing    Home Living                      Prior Function            PT Goals (current goals can now be found in the care plan section) Progress towards PT goals: Progressing toward goals    Frequency    Min 2X/week      PT Plan Current plan remains appropriate    Co-evaluation              AM-PAC PT "6 Clicks" Daily Activity  Outcome Measure  Difficulty turning over in bed (including adjusting bedclothes, sheets and blankets)?: A Lot Difficulty moving from lying on back to sitting on the side of the bed? : Unable Difficulty sitting down on and standing up from a chair with arms (e.g.,  wheelchair, bedside commode, etc,.)?: Unable Help needed moving to and from a bed to chair (including a wheelchair)?: Total Help needed walking in hospital room?: Total Help needed climbing 3-5 steps with a railing? : Total 6 Click Score: 7    End of Session   Activity Tolerance: Patient limited by fatigue;Treatment limited secondary to medical complications (Comment) Patient left: in bed;with bed alarm set;with call bell/phone within reach;with family/visitor present;with nursing/sitter in room         Time: 8676-1950 PT Time Calculation (min) (ACUTE ONLY): 18 min  Charges:  $Therapeutic Activity: 8-22 mins                     Chesley Noon, PTA 04/16/18, 12:41 PM

## 2018-04-16 NOTE — Evaluation (Signed)
Clinical/Bedside Swallow Evaluation Patient Details  Name: Deborah Obrien MRN: [id number] Date of Birth: [birth year]  Today's Date: 04/16/2018 Time: SLP Start Time (ACUTE ONLY): 1015 SLP Stop Time (ACUTE ONLY): 1115 SLP Time Calculation (min) (ACUTE ONLY): 60 min  Past Medical History:  Past Medical History:  Diagnosis Date  . A-fib ([place]) 2016  . Acid reflux   . Anxiety   . Bowel obstruction ([place])   . CHF (congestive heart failure) ([place]) 2017  . Chronic kidney disease (CKD) stage G3a/A1, moderately decreased glomerular filtration rate (GFR) between 45-59 mL/min/1.73 square meter and albuminuria creatinine ratio less than 30 mg/g (HCC)   . Diabetes mellitus without complication ([place])   . Dyspnea   . Fatty liver   . GI bleed   . Gout   . Hypertension   . Morbid obesity ([place])   . Osteoarthritis   . Osteoporosis   . Paroxysmal atrial fibrillation (HCC)   . Presence of permanent cardiac pacemaker   . Sinus node dysfunction ([place]) 08/24/2017  . TIA (transient ischemic attack)    Past Surgical History:  Past Surgical History:  Procedure Laterality Date  . ABDOMINAL HYSTERECTOMY  1980  . APPENDECTOMY    . BACK SURGERY     2008  . BREAST BIOPSY Right 08/16   stereo fibroadenomatous change, neg for atypia  . BREAST EXCISIONAL BIOPSY Left    neg  . CARDIAC CATHETERIZATION Left 08/25/2016   Procedure: Left Heart Cath and Coronary Angiography;  Surgeon: Corey Skains, MD;  Location: [facility];  Service: Cardiovascular;  Laterality: Left;  . CARDIOVERSION N/A 06/28/2017   Procedure: CARDIOVERSION;  Surgeon: Corey Skains, MD;  Location: [facility];  Service: Cardiovascular;  Laterality: N/A;  . CARDIOVERSION N/A 02/06/2018   Procedure: CARDIOVERSION;  Surgeon: Josue Hector, MD;  Location: [facility];  Service: Cardiovascular;  Laterality: N/A;  . CATARACT EXTRACTION, BILATERAL  2012  . CHOLECYSTECTOMY    . colon blockage  1999  . COLON SURGERY  1999  .  COLONOSCOPY  2016   polyps removed 2016  . DORSAL COMPARTMENT RELEASE Left 09/14/2016   Procedure: RELEASE DORSAL COMPARTMENT ([place]);  Surgeon: Dereck Leep, MD;  Location: [facility];  Service: Orthopedics;  Laterality: Left;  . ESOPHAGOGASTRODUODENOSCOPY N/A 01/27/2015   Procedure: ESOPHAGOGASTRODUODENOSCOPY (EGD);  Surgeon: Lollie Sails, MD;  Location: [facility];  Service: Endoscopy;  Laterality: N/A;  . ESOPHAGOGASTRODUODENOSCOPY (EGD) WITH PROPOFOL N/A 07/24/2015   Procedure: ESOPHAGOGASTRODUODENOSCOPY (EGD) WITH PROPOFOL;  Surgeon: Lucilla Lame, MD;  Location: [facility];  Service: Endoscopy;  Laterality: N/A;  . ESOPHAGOGASTRODUODENOSCOPY (EGD) WITH PROPOFOL N/A 04/12/2018   Procedure: ESOPHAGOGASTRODUODENOSCOPY (EGD) WITH PROPOFOL;  Surgeon: Jonathon Bellows, MD;  Location: [facility] ENDOSCOPY;  Service: Gastroenterology;  Laterality: N/A;  . JOINT REPLACEMENT  2014   Bilateral Knee replacement  . LEAD REVISION/REPAIR N/A 08/29/2017   Procedure: LEAD REVISION/REPAIR;  Surgeon: Constance Haw, MD;  Location: [facility];  Service: Cardiovascular;  Laterality: N/A;  . PACEMAKER INSERTION Left 07/01/2015   Procedure: INSERTION PACEMAKER;  Surgeon: Isaias Cowman, MD;  Location: [facility];  Service: Cardiovascular;  Laterality: Left;  . PACEMAKER REVISION N/A 08/28/2017   Procedure: PACEMAKER REVISION;  Surgeon: Deboraha Sprang, MD;  Location: [facility];  Service: Cardiovascular;  Laterality: N/A;  . REPLACEMENT TOTAL KNEE BILATERAL    . SHOULDER ARTHROSCOPY WITH SUBACROMIAL DECOMPRESSION Left 2013  . TEE WITHOUT CARDIOVERSION N/A 06/28/2017   Procedure: TRANSESOPHAGEAL ECHOCARDIOGRAM (TEE);  Surgeon:  Corey Skains, MD;  Location: [facility];  Service: Cardiovascular;  Laterality: N/A;   HPI:  Pt is a 77 year old female with past medical history of multiple medical issues including Acid Reflux, paroxysmal atrial fibrillation status post pacemaker, previous  history of TIA, hypertension, morbid obesity, diabetes, chronic kidney disease stage III, anxiety, GERD who presented to the hospital due to abdominal pain and noted to have partial small bowel obstruction.  Pt was extubated 04/14/18 after ~10 days.  Pt remains easily SOB w/ increased WOB w/ ANY exertion including talking, moving about in bed. Pt requires rest breaks to calm her breathing after exertion. Pt endorsed a somewhat reduced vocal quality post extubation. Per MD and pt/husband report, pt has gained ~20-30 lbs of fluid w/ noted Pulmonary edema per CXR(Vascular congestion noted. Increased interstitial markings raise concern for pulmonary edema. Small bilateral pleural effusions). Pt is currently on Lasix.    Assessment / Plan / Recommendation Clinical Impression  Pt appears to present w/ adequate oropharyngeal phase swallow function w/ reduced risk for aspiration when following aspiration precautions. However, pt is easily SOB w/ ANY exertion - needed rest breaks frequently during po tasks to reduce WOB. This presention can increase risk for choking/aspiration. Pt consumed po trials of ice chips, thin liquids via Cup(NO Straws), puree and softened solids w/ no immediate, overt s/s of aspiration noted - clear vocal quality post trials; no decline (from baseline) in respiratory status during/post trials. Of note, pt was given frequent rest breaks and instructed on NO TALKING during po tasks to support pulmonary status and reduced WOB during tasks. Oral phase c/b timely bolus management for A-P transfer w/ liquids and purees; min more oral phase time/effort w/ increased texture of the softened solids d/t Pulmonary demand - again rest breaks given/instructed on. Pt was assisted w/ self-feeding d/t overall weakness and Pulmonary fatigue/WOB but she was able to hold her own Cup to drink and feed self applesauce. She was educated on NOT talking during drinking/eating tasks to lessen risk for choking/aspiration;  frequent rest breaks, Educated pt/family on diet consistency and food preparation and easier consistencies to masticate to lessen WOB/effort overall. Recommend a mech soft diet w/ thin liquids (Dysphagia level 3 w/ thins) w/ NO STRAWS; aspiration precautions; monitoring and assistance w/ feeding during meals; frequent rest breaks. Recommend Pills WHOLE in Puree - pt was aware of using such. Pt and family were educated on the relationship of Swallowing and Breathing and how it can lead to dysphagia. NSG updated.  SLP Visit Diagnosis: Dysphagia, unspecified (R13.10)    Aspiration Risk  (reduced following general aspiration precautions)    Diet Recommendation  Dysphagia level 3(mech soft) w/ thin liquids - NO straws; Aspiration precautions. Support at meals; frequent Rest Breaks during meals to lessen WOB/SOB.   Medication Administration: Whole meds with puree(for safer swallowing)    Other  Recommendations Recommended Consults: (Dietician f/u) Oral Care Recommendations: Oral care BID;Staff/trained caregiver to provide oral care Other Recommendations: (n/a)   Follow up Recommendations None      Frequency and Duration min 2x/week  1 week       Prognosis Prognosis for Safe Diet Advancement: Fair(-Good ) Barriers to Reach Goals: Severity of deficits((of her Pulmonary status)      Swallow Study   General Date of Onset: 04/04/18 HPI: Pt is a 77 year old female with past medical history of multiple medical issues including Acid Reflux, paroxysmal atrial fibrillation status post pacemaker, previous history of TIA, hypertension, morbid obesity, diabetes, chronic  kidney disease stage III, anxiety, GERD who presented to the hospital due to abdominal pain and noted to have partial small bowel obstruction.  Pt was extubated 04/14/18 after ~10 days.  Pt remains easily SOB w/ increased WOB w/ ANY exertion including talking, moving about in bed. Pt requires rest breaks to calm her breathing after exertion.  Pt endorsed a somewhat reduced vocal quality post extubation. Per MD and pt/husband report, pt has gained ~20-30 lbs of fluid w/ noted Pulmonary edema per CXR(Vascular congestion noted. Increased interstitial markings raise concern for pulmonary edema. Small bilateral pleural effusions). Pt is currently on Lasix.  Type of Study: Bedside Swallow Evaluation Previous Swallow Assessment: none reported Diet Prior to this Study: NPO(regular diet at home prior per report) Temperature Spikes Noted: No(wbc 10.7) Respiratory Status: Nasal cannula(3 liters) History of Recent Intubation: Yes Length of Intubations (days): 10 days Date extubated: 04/14/18 Behavior/Cognition: Alert;Cooperative;Pleasant mood(SOB/WOB) Oral Cavity Assessment: Within Functional Limits Oral Care Completed by SLP: Recent completion by staff Oral Cavity - Dentition: Adequate natural dentition Vision: Functional for self-feeding Self-Feeding Abilities: Able to feed self;Needs assist;Needs set up(min) Patient Positioning: Upright in bed(needs support from pillows low behind back) Baseline Vocal Quality: Normal(but easily SOB) Volitional Cough: Strong Volitional Swallow: Able to elicit    Oral/Motor/Sensory Function Overall Oral Motor/Sensory Function: Within functional limits   Ice Chips Ice chips: Within functional limits Presentation: Spoon(fed; 3 trials) Other Comments: easily SOB w/ exertion   Thin Liquid Thin Liquid: Impaired Presentation: Cup;Self Fed(~4 ozs) Oral Phase Impairments: (none) Oral Phase Functional Implications: (none) Pharyngeal  Phase Impairments: (easily SOB w/ exertion - needed rest breaks) Other Comments: rest breaks given frequently    Nectar Thick Nectar Thick Liquid: Not tested   Honey Thick Honey Thick Liquid: Not tested   Puree Puree: Impaired Presentation: Self Fed;Spoon(4 ozs) Oral Phase Impairments: (none) Oral Phase Functional Implications: (none) Pharyngeal Phase Impairments: (easily  SOB w/ exertion - needed rest breaks) Other Comments: rest breaks frequently   Solid     Solid: Impaired Presentation: Self Fed(broken down, moistened well) Oral Phase Impairments: (none) Oral Phase Functional Implications: (none) Pharyngeal Phase Impairments: (easily SOB w/ exertion - needed rest breaks) Other Comments: rest breaks frequently      Orinda Kenner, MS, CCC-SLP Glendi Mohiuddin 04/16/2018,3:46 PM

## 2018-04-16 NOTE — Progress Notes (Signed)
Pharmacy Antibiotic Note  Deborah Obrien is a 77 y.o. female admitted on 04/04/2018 with pneumonia.  Pharmacy has been consulted for cefazolin dosing. Patient previously received Unasyn and Vancomycin.  Plan: Continue cefazolin 1g IV Q8hr.   Height: 5\' 6"  (167.6 cm) Weight: 292 lb 6.4 oz (132.6 kg) IBW/kg (Calculated) : 59.3  Temp (24hrs), Avg:98.1 F (36.7 C), Min:97.8 F (36.6 C), Max:98.4 F (36.9 C)  Recent Labs  Lab 04/11/18 0312 04/11/18 1848 04/12/18 0129 04/13/18 0521 04/14/18 0906 04/15/18 0638 04/15/18 0736 04/16/18 0634  WBC 10.4 7.5 8.5 7.5  --   --  10.7  --   CREATININE 1.54*  --  1.62* 1.47* 1.49* 1.51*  --  1.63*    Estimated Creatinine Clearance: 40.4 mL/min (A) (by C-G formula based on SCr of 1.63 mg/dL (H)).    Allergies  Allergen Reactions  . Cephalosporins Other (See Comments)    Reaction:  Unknown   . Macrolides And Ketolides Other (See Comments)    Reaction:  Unknown   . Meperidine Shortness Of Breath, Nausea Only and Other (See Comments)    Reaction:  Stomach pain   . Prednisone Anaphylaxis and Other (See Comments)    Reaction:  Increases pts blood sugar  Pt states that she is allergic to all steroids.    . Shellfish Allergy Anaphylaxis, Shortness Of Breath, Diarrhea, Nausea Only and Rash    Reaction:  Stomach pain   . Sulfa Antibiotics Shortness Of Breath, Diarrhea, Nausea Only and Other (See Comments)    Reaction:  Stomach pain   . Uloric [Febuxostat] Anaphylaxis    Locks pt's body up   . Aspirin Other (See Comments)    Reaction:  Stomach pain   . Celecoxib Other (See Comments)    Reaction:  GI bleed, weakness, and stomach pain.    . Cephalexin Diarrhea, Nausea Only and Other (See Comments)    Reaction:  Stomach pain   . Erythromycin Diarrhea, Nausea Only and Other (See Comments)    Reaction:  Stomach pain   . Hydromorphone Other (See Comments)    Reaction:  Hypotension   . Iodinated Diagnostic Agents Rash and Other (See Comments)     Pt states that she is unable to have because she has chronic kidney disease.    Marland Kitchen Oxycodone Other (See Comments)    Reaction:  Stomach pain   . Atorvastatin Nausea Only and Other (See Comments)    Reaction:  Weakness   . Codeine Diarrhea, Nausea Only and Other (See Comments)    Reaction:  Stomach pain Pt tolerates morphine   . Doxycycline     Stomach pain   . Tape Other (See Comments)    Reaction:  Causes pts skin to tear  Pt states that she is able to use paper tape.    . Valdecoxib Swelling and Other (See Comments)    Pt states that her hands and feet swell.    . Iodine Rash    Antimicrobials this admission: Zosyn 7/29 x 1  Unasyn 7/29 >> 8/1 Vancomycin 7/29 >> 7/31 Cefazolin 8/1 >>  Dose adjustments this admission: N/A  Microbiology results: 7/29 Sputum: klebsiella 7/26  MRSA PCR: negative  8/1 Cdiff Scan: negative   Thank you for allowing pharmacy to be a part of this patient's care.  Simpson,Michael L,  04/16/2018 7:11 PM

## 2018-04-16 NOTE — Progress Notes (Addendum)
Progress Note  Patient Name: Deborah Obrien Date of Encounter: 04/16/2018  Primary Cardiologist: Caryl Comes  Subjective   She transferred out of the ICU on 8/4. Patient failed bedside swallow evaluation and is awaiting speech evaluation. Overnight, she was noted to have episodes of SOB. Heart rate ranged from the 60s-130s bpm in Afib. Documented weight 279-->292 pounds from 8/4-->8/5. Documented UOP of 730 mL for the past 24 hours. Net + 20 L for the admission. CXR this morning showed vascular congestion with increased interstitial markings concerning for pulmonary edema with small bilateral pleural effusions. She was given IV Lasix 20 mg overnight, no diuretic currently ordered. Renal function 1.49-->1.51-->1.63. Potassium 3.8-->4.9. Magnesium 2.0.  Inpatient Medications    Scheduled Meds: . busPIRone  10 mg Oral BID  . docusate sodium  100 mg Oral Daily  . insulin aspart  0-15 Units Subcutaneous TID WC  . insulin aspart  0-5 Units Subcutaneous QHS  . ipratropium-albuterol  3 mL Nebulization Q6H  . mouth rinse  15 mL Mouth Rinse BID  . pantoprazole  40 mg Oral BID AC  . PARoxetine  20 mg Oral Daily  . sodium chloride flush  10-40 mL Intracatheter Q12H  . traZODone  50 mg Oral QHS   Continuous Infusions: . amiodarone 30 mg/hr (04/16/18 0556)  .  ceFAZolin (ANCEF) IV Stopped (04/16/18 0656)   PRN Meds: acetaminophen **OR** acetaminophen, benzocaine-Menthol, dibucaine, metoprolol tartrate, ondansetron **OR** ondansetron (ZOFRAN) IV, phenol, polyvinyl alcohol, sodium chloride flush, witch hazel-glycerin   Vital Signs    Vitals:   04/15/18 2009 04/15/18 2336 04/16/18 0158 04/16/18 0315  BP:  (!) 90/59 (!) 147/80 (!) 127/96  Pulse:  (!) 122 100 (!) 131  Resp:  20    Temp:    98.1 F (36.7 C)  TempSrc:    Oral  SpO2: 94% 98%  91%  Weight:    292 lb 6.4 oz (132.6 kg)  Height:        Intake/Output Summary (Last 24 hours) at 04/16/2018 0740 Last data filed at 04/16/2018  0634 Gross per 24 hour  Intake 669.97 ml  Output 1400 ml  Net -730.03 ml   Filed Weights   04/14/18 0500 04/15/18 0500 04/16/18 0315  Weight: 279 lb 5.2 oz (126.7 kg) 279 lb 8.7 oz (126.8 kg) 292 lb 6.4 oz (132.6 kg)    Telemetry    Afib, 110s to 130s bpm - Personally Reviewed  ECG    n/a - Personally Reviewed  Physical Exam   GEN: No acute distress.   Neck: JVD difficult to assess secondary to body habitus. Cardiac: Tachycardic, irregularly irregular, no murmurs, rubs, or gallops.  Respiratory: Diminsihed breath sounds bilaterally with crackles along the bases.  GI: Soft, nontender, non-distended.   MS: 1+ pitting lower extremity edema; No deformity. Neuro:  Alert and oriented x 3; Nonfocal.  Psych: Normal affect.  Labs    Chemistry Recent Labs  Lab 04/13/18 0521 04/14/18 0906 04/15/18 0638 04/16/18 0634  NA 143 142 143 144  K 3.1* 3.7 3.8 4.9  CL 109 107 108 107  CO2 29 26 25  21*  GLUCOSE 144* 252* 131* 204*  BUN 44* 44* 33* 33*  CREATININE 1.47* 1.49* 1.51* 1.63*  CALCIUM 7.4* 7.9* 8.0* 8.3*  ALBUMIN 2.0*  --   --   --   GFRNONAA 33* 33* 32* 29*  GFRAA 39* 38* 37* 34*  ANIONGAP 5 9 10  16*     Hematology Recent Labs  Lab 04/12/18  0129 04/12/18 1627 04/13/18 0521 04/15/18 0736  WBC 8.5  --  7.5 10.7  RBC 2.73*  --  2.85* 2.87*  HGB 8.9* 9.8* 9.2* 9.5*  HCT 26.7* 28.7* 27.1* 27.4*  MCV 98.0  --  95.0 95.7  MCH 32.5  --  32.2 33.2  MCHC 33.1  --  33.9 34.7  RDW 16.5*  --  16.1* 16.1*  PLT 142*  --  154 177    Cardiac Enzymes Recent Labs  Lab 04/09/18 0951 04/09/18 1458  TROPONINI 0.10* 0.09*   No results for input(s): TROPIPOC in the last 168 hours.   BNP Recent Labs  Lab 04/10/18 0442  BNP 1,164.0*     DDimer No results for input(s): DDIMER in the last 168 hours.   Radiology    Dg Chest Port 1 View  Result Date: 04/16/2018 IMPRESSION: Vascular congestion noted. Increased interstitial markings raise concern for pulmonary edema.  Small bilateral pleural effusions noted. Electronically Signed   By: Garald Balding M.D.   On: 04/16/2018 03:56    Cardiac Studies   Echo 04/10/2018: Study Conclusions  - Left ventricle: The cavity size was normal. Wall thickness was   normal. Systolic function was normal. The estimated ejection   fraction was in the range of 60% to 65%. - Mitral valve: Moderately thickened, mildly calcified leaflets . - Left atrium: The atrium was mildly dilated. - Right ventricle: The cavity size was mildly to moderately   dilated. Wall thickness was mildly increased. - Right atrium: The atrium was mildly dilated. - Tricuspid valve: There was moderate regurgitation. - Pulmonary arteries: Systolic pressure was moderately increased,   estimated to be 50 mm Hg plus central venous/right atrial   pressure.  Patient Profile     77 y.o. 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  Assessment & Plan    1. PAF with RVR: -Remains in Afib with morning with ventricular rates in the 130s bpm -Has been having periodic episodes of spontaneous conversion to sinus, none overnight  -Remains on IV amiodarone gtt given she has not yet passed swallow evaluation -Continue IV amiodarone -Schedule IV metoprolol 5 mg tid for ventricular rate > 110 bpm -Not currently a candidate for anticoagulation given GI bleed and anemia   2. Acute respiratory failure with hypoxia and hypercapnia: -Now extubated and transferred to telemetry  -Multifactorial in the setting of underlying severe COPD,pulmonary hypertension, acute on chronic diastolic CHF,morbid obesity, and possible OHS with possible aspiration pneumonia -Consider PE evaluation as renal function improves, defer to IM  3. Acute on chronic diastolic CHF/pleural effusions: -Likely exacerbated by third spacing from her persistent anemia and  hypoalbuminemia  -Cannot exclude RV failure -Aggressive IV diuresis -May need milrinone for RV support to augment diuresis  -Renal function continues to bump with gentle diuresis   4. Partial SBO: -Per IM  5. Acute blood loss anemia/upper GI bleed: -No further noted bloody BMs -No CBC today, check -Per IM  6. AKI: -Renal function continues to bump with gentle diuresis   7. Hypoalbuminemia: -Check albumin -Most recent value of 2.0 from 8/2 -Likely contributing to the above -Recommend IV repletion, defer to IM  For questions or updates, please contact [facility] Please consult [email or web address] for contact info under Cardiology/STEMI.    Signed, Christell Faith, PA-C [place] Pager: [phone] 04/16/2018, 7:40 AM

## 2018-04-16 NOTE — Progress Notes (Signed)
[facility] NAME: Deborah Obrien    MR#:  [id number]  DATE OF BIRTH:  1941/04/20  SUBJECTIVE:   Patient is more awake and alert today.  Husband is at bedside.  Patient seen by speech therapy today still in atrial fibrillation with RVR   No further acute bleeding.  REVIEW OF SYSTEMS:    Review of Systems  Constitutional: Negative for chills and fever.  HENT: Negative for congestion and tinnitus.   Eyes: Negative for blurred vision and double vision.  Respiratory: Negative for cough, shortness of breath and wheezing.   Cardiovascular: Negative for chest pain, orthopnea and PND.  Gastrointestinal: Negative for abdominal pain, diarrhea, nausea and vomiting.  Genitourinary: Negative for dysuria and hematuria.  Neurological: Negative for dizziness, sensory change and focal weakness.  All other systems reviewed and are negative.   Nutrition: NPO Tolerating Diet: no Tolerating PT: Await Eval.   DRUG ALLERGIES:   Allergies  Allergen Reactions  . Cephalosporins Other (See Comments)    Reaction:  Unknown   . Macrolides And Ketolides Other (See Comments)    Reaction:  Unknown   . Meperidine Shortness Of Breath, Nausea Only and Other (See Comments)    Reaction:  Stomach pain   . Prednisone Anaphylaxis and Other (See Comments)    Reaction:  Increases pts blood sugar  Pt states that she is allergic to all steroids.    . Shellfish Allergy Anaphylaxis, Shortness Of Breath, Diarrhea, Nausea Only and Rash    Reaction:  Stomach pain   . Sulfa Antibiotics Shortness Of Breath, Diarrhea, Nausea Only and Other (See Comments)    Reaction:  Stomach pain   . Uloric [Febuxostat] Anaphylaxis    Locks pt's body up   . Aspirin Other (See Comments)    Reaction:  Stomach pain   . Celecoxib Other (See Comments)    Reaction:  GI bleed, weakness, and stomach pain.    . Cephalexin Diarrhea, Nausea Only and Other (See Comments)    Reaction:  Stomach  pain   . Erythromycin Diarrhea, Nausea Only and Other (See Comments)    Reaction:  Stomach pain   . Hydromorphone Other (See Comments)    Reaction:  Hypotension   . Iodinated Diagnostic Agents Rash and Other (See Comments)    Pt states that she is unable to have because she has chronic kidney disease.    Marland Kitchen Oxycodone Other (See Comments)    Reaction:  Stomach pain   . Atorvastatin Nausea Only and Other (See Comments)    Reaction:  Weakness   . Codeine Diarrhea, Nausea Only and Other (See Comments)    Reaction:  Stomach pain Pt tolerates morphine   . Doxycycline     Stomach pain   . Tape Other (See Comments)    Reaction:  Causes pts skin to tear  Pt states that she is able to use paper tape.    . Valdecoxib Swelling and Other (See Comments)    Pt states that her hands and feet swell.    . Iodine Rash    VITALS:  Blood pressure 120/71, pulse (!) 128, temperature 98.4 F (36.9 C), temperature source Oral, resp. rate (!) 22, height 5\' 6"  (1.676 m), weight 132.6 kg (292 lb 6.4 oz), SpO2 96 %.  PHYSICAL EXAMINATION:   Physical Exam  GENERAL:  77 y.o.-year-old obese patient lying in bed awake and alert and in NAD.  EYES: Pupils equal, round, reactive to light.  No scleral icterus. Extraocular muscles intact.  HEENT: Head atraumatic, normocephalic.  NECK:  Supple, no jugular venous distention. No thyroid enlargement, no tenderness.  LUNGS: Normal breath sounds bilaterally, no wheezing, rales, rhonchi. No use of accessory muscles of respiration.  CARDIOVASCULAR: Irregularly regular no murmurs, rubs, or gallops.  ABDOMEN: Soft, NT, ND. Bowel sounds present.  EXTREMITIES: No cyanosis, clubbing, + 1-2 edema b/l NEUROLOGIC: Cranial nerves II through XII intact, no focal motor or sensory deficits appreciated bilaterally. Globally weak.  PSYCHIATRIC: Alert awake oriented x 3.  Follows simple commands. SKIN: No obvious rash, lesion, or ulcer.   LABORATORY PANEL:   CBC Recent Labs  Lab  04/15/18 0736  WBC 10.7  HGB 9.5*  HCT 27.4*  PLT 177   ------------------------------------------------------------------------------------------------------------------  Chemistries  Recent Labs  Lab 04/16/18 0634  NA 144  K 4.9  CL 107  CO2 21*  GLUCOSE 204*  BUN 33*  CREATININE 1.63*  CALCIUM 8.3*  MG 2.0   ------------------------------------------------------------------------------------------------------------------  Cardiac Enzymes No results for input(s): TROPONINI in the last 168 hours. ------------------------------------------------------------------------------------------------------------------  RADIOLOGY:  Dg Chest Port 1 View  Result Date: 04/16/2018 CLINICAL DATA:  Acute onset of dyspnea. EXAM: PORTABLE CHEST 1 VIEW COMPARISON:  Chest radiograph performed 04/12/2018 FINDINGS: The lungs are well-aerated. Vascular congestion is noted. Increased interstitial markings raise concern for pulmonary edema. Small bilateral pleural effusions are noted. No pneumothorax is seen. The cardiomediastinal silhouette is normal in size. A pacemaker is noted overlying the left chest wall, with leads ending overlying the right atrium and right ventricle. No acute osseous abnormalities are seen. The patient's right PICC is noted ending about the proximal SVC. Cervical spinal fusion hardware is noted. IMPRESSION: Vascular congestion noted. Increased interstitial markings raise concern for pulmonary edema. Small bilateral pleural effusions noted. Electronically Signed   By: Garald Balding M.D.   On: 04/16/2018 03:56     ASSESSMENT AND PLAN:   77 year old female with past medical history of paroxysmal atrial fibrillation status post pacemaker, previous history of TIA, hypertension, morbid obesity, diabetes, chronic kidney disease stage III, anxiety, GERD who presented to the hospital due to abdominal pain and noted to have partial small bowel obstruction.   - .Atrial fibrillation  with rapid ventricular response- she has now converted to normal sinus rhythm, appreciate cardiology input.  Continue amiodarone drip for now.  Holding Eliquis given melena/GI bleed.   IV Lopressor as needed for rate control  -Acute on chronic respiratory failure with hypoxia and hypercapnia- due to underlying COPD, morbid obesity, Aspiration Pneumonia. -Patient extubated 04/13/18 and Doing well from the respiratory standpoint.  Patient's sputum cultures were positive for Klebsiella. cont. IV ancef and will switch to oral antibiotics  -Acute on chronic diastolic CHF with volume overload Patient has gained 30 to 40 pounds over her dry weight. Lasix 60 mg IV every 8 hours   -Hypoalbuminemia with third spacing-IV albumin  -.  GI bleed- patient was noted to have some dark/melanotic stools .  Hemoglobin had dropped to 7.0 and therefore patient received 1 unit of packed red blood cells. Hg up to 9.5 today and stable and no bleeding overnight.  -A gastroenterology consult obtained and patient underwent an upper GI endoscopy which showed a gastric polyp which was bleeding and it was injected.  Bleeding has stopped.   Hemoglobin stable, continue PPI. - Patient tolerating PO , dysphagia 3 diet  -.  Acute partial small bowel obstruction- cause of patient's worsening abdominal pain on admission but now resolved. NO  acute issue  -.  Acute blood loss anemia- initially thought to be secondary to Mallory-Weiss tear, but patient has history of Hyperplastic polyps.  Status post upper GI endoscopy yesterday which showed a bleeding gastric polyp which was injected. -Hemoglobin improved posttransfusion stable at 9.5 and stable.  No other acute bleeding overnight.  -  Hypernatremia- continue free water flushes, and sodium normalized now.  -  Aspiration pneumonia- pt. Was on IV Unasyn.  Sputum cultures were positive for Klebsiella and its pansensitive.   - cont. Ancef and switch to Oral when pt. Can take PO.  -  High risk for aspiration.  Seen by speech therapy recommending dysphagia 3 diet with thin liquids  8. Hyperkalemia - resolved  Await PT eval when tolerated.   All the records are reviewed and case discussed with Care Management/Social Worker. Management plans discussed with the patient, family and they are in agreement.  CODE STATUS: Full code  DVT Prophylaxis: Ted's & SCD's.   TOTAL TIME TAKING CARE OF THIS PATIENT: 33 minutes.   POSSIBLE D/C unclear, DEPENDING ON CLINICAL CONDITION and course.   Nicholes Mango M.D on 04/16/2018 at 4:34 PM  Between 7am to 6pm - Pager - [phone]  After 6pm go to [email or web address] - Technical brewer [facility]  Office  [phone]  CC: Primary care physician; [name], MD

## 2018-04-16 NOTE — Progress Notes (Signed)
Pharmacy Electrolyte Monitoring Consult:  Pharmacy consulted to assist in monitoring and replacing electrolytes and glucose management in this 77 y.o. female admitted on 04/04/2018 with Hematemesis and Abdominal Pain   Labs:  Sodium (mmol/L)  Date Value  04/16/2018 144  10/16/2014 141   Potassium (mmol/L)  Date Value  04/16/2018 4.9  10/16/2014 4.6   Magnesium (mg/dL)  Date Value  04/16/2018 2.0   Phosphorus (mg/dL)  Date Value  04/16/2018 4.3   Calcium (mg/dL)  Date Value  04/16/2018 8.3 (L)   Calcium, Total (mg/dL)  Date Value  10/16/2014 8.5   Albumin (g/dL)  Date Value  04/13/2018 2.0 (L)  10/29/2012 3.9    Assessment/Plan: Continue Lasix 60 mg IV every 8 hours.  Reviewed patient labs; no need for electrolyte supplementation today. Goal potassium of ~4.0. Will re-check potassium at 1800. Will continue to monitor BMP for electrolyte abnormalities.  Pharmacy will continue to monitor and adjust per consult.   Paticia Stack, PharmD Pharmacy Resident  04/16/2018 2:29 PM

## 2018-04-16 NOTE — Progress Notes (Signed)
77 yo female with acute on chronic diastolic heart failure with severe volume overload.   Dry wt: 237 lbs Current wt: 292 lbs  Continue Lasix 60 mg IV every 8 hours.  Reviewed patient labs; no need for electrolyte supplementation today. Will continue to monitor BMP for electrolyte abnormalities.  Paticia Stack, PharmD Pharmacy Resident  04/16/2018 11:02 AM

## 2018-04-16 NOTE — Progress Notes (Signed)
Patient has been having episodes of SOB tonight. Heart rhythm going in and out of A-fib, with HR ranging from 60s-130s. Notified via NT Daniell that patient's weight up almost 20lbs from yesterday; per NT, pt has only had around 4107mL of urine output tonight. Notified MD Jodell Cipro and MD placed orders for chest x-ray and 20mg  IV lasix once. Patient resting comfortably and in NAD at this time. Nursing staff will continue to monitor for any changes in patient status. Earleen Reaper, RN

## 2018-04-17 ENCOUNTER — Inpatient Hospital Stay: Payer: Medicare Other

## 2018-04-17 LAB — CBC
HCT: 26.5 % — ABNORMAL LOW (ref 35.0–47.0)
Hemoglobin: 8.9 g/dL — ABNORMAL LOW (ref 12.0–16.0)
MCH: 31.9 pg (ref 26.0–34.0)
MCHC: 33.6 g/dL (ref 32.0–36.0)
MCV: 94.9 fL (ref 80.0–100.0)
Platelets: 262 K/uL (ref 150–440)
RBC: 2.79 MIL/uL — ABNORMAL LOW (ref 3.80–5.20)
RDW: 16.5 % — ABNORMAL HIGH (ref 11.5–14.5)
WBC: 9.6 K/uL (ref 3.6–11.0)

## 2018-04-17 LAB — BASIC METABOLIC PANEL
ANION GAP: 12 (ref 5–15)
BUN: 29 mg/dL — ABNORMAL HIGH (ref 8–23)
CO2: 27 mmol/L (ref 22–32)
Calcium: 8.2 mg/dL — ABNORMAL LOW (ref 8.9–10.3)
Chloride: 104 mmol/L (ref 98–111)
Creatinine, Ser: 1.67 mg/dL — ABNORMAL HIGH (ref 0.44–1.00)
GFR calc Af Amer: 33 mL/min — ABNORMAL LOW (ref >60)
GFR, EST NON AFRICAN AMERICAN: 28 mL/min — AB (ref >60)
GLUCOSE: 273 mg/dL — AB (ref 70–99)
POTASSIUM: 3.9 mmol/L (ref 3.5–5.1)
Sodium: 143 mmol/L (ref 135–145)

## 2018-04-17 LAB — GLUCOSE, CAPILLARY
Glucose-Capillary: 273 mg/dL — ABNORMAL HIGH (ref 70–99)
Glucose-Capillary: 294 mg/dL — ABNORMAL HIGH (ref 70–99)
Glucose-Capillary: 238 mg/dL — ABNORMAL HIGH (ref 70–99)
Glucose-Capillary: 345 mg/dL — ABNORMAL HIGH (ref 70–99)

## 2018-04-17 LAB — MAGNESIUM: Magnesium: 1.7 mg/dL (ref 1.7–2.4)

## 2018-04-17 LAB — ALBUMIN: Albumin: 2.3 g/dL — ABNORMAL LOW (ref 3.5–5.0)

## 2018-04-17 MED ORDER — POLYETHYLENE GLYCOL 3350 17 G PO PACK: 17.0000 g | PACK | Freq: Every day | ORAL | Status: DC | PRN

## 2018-04-17 MED ORDER — POLYETHYLENE GLYCOL 3350 17 G PO PACK
17.0000 g | PACK | Freq: Once | ORAL | Status: AC
Start: 2018-04-17 — End: 2018-04-17
  Administered 2018-04-17: 17 g via ORAL
  Filled 2018-04-17: qty 1

## 2018-04-17 MED ORDER — POTASSIUM CHLORIDE CRYS ER 20 MEQ PO TBCR
20.0000 meq | EXTENDED_RELEASE_TABLET | Freq: Once | ORAL | Status: AC
  Administered 2018-04-17: 20 meq via ORAL
  Filled 2018-04-17: qty 1

## 2018-04-17 MED ORDER — MAGNESIUM SULFATE 2 GM/50ML IV SOLN
2.0000 g | Freq: Once | INTRAVENOUS | Status: AC
  Administered 2018-04-17: 2 g via INTRAVENOUS
  Filled 2018-04-17: qty 50

## 2018-04-17 MED ORDER — METOPROLOL TARTRATE 25 MG PO TABS
25.0000 mg | ORAL_TABLET | Freq: Two times a day (BID) | ORAL | Status: DC
  Administered 2018-04-17 – 2018-04-18 (×3): 25 mg via ORAL
  Filled 2018-04-17 (×4): qty 1

## 2018-04-17 MED ORDER — TRAMADOL HCL 50 MG PO TABS
50.0000 mg | ORAL_TABLET | Freq: Four times a day (QID) | ORAL | Status: DC | PRN
  Administered 2018-04-17 – 2018-04-19 (×2): 50 mg via ORAL
  Filled 2018-04-17 (×2): qty 1

## 2018-04-17 MED ORDER — MORPHINE SULFATE (PF) 2 MG/ML IV SOLN
2.0000 mg | INTRAVENOUS | Status: DC | PRN
  Administered 2018-04-17 – 2018-04-22 (×16): 2 mg via INTRAVENOUS
  Filled 2018-04-17 (×16): qty 1

## 2018-04-17 MED ORDER — TRAMADOL HCL 50 MG PO TABS: 50.0000 mg | ORAL_TABLET | Freq: Four times a day (QID) | ORAL | Status: DC | PRN

## 2018-04-17 MED ORDER — IPRATROPIUM-ALBUTEROL 0.5-2.5 (3) MG/3ML IN SOLN: 3.0000 mL | Freq: Three times a day (TID) | RESPIRATORY_TRACT | Status: DC

## 2018-04-17 NOTE — Progress Notes (Addendum)
  Speech Language Pathology Treatment: Dysphagia  Patient Details Name: Deborah Obrien MRN: [id number] DOB: Nov 04, 1940 Today's Date: 04/17/2018 Time: 5427-0623 SLP Time Calculation (min) (ACUTE ONLY): 35 min  Assessment / Plan / Recommendation Clinical Impression  Pt seen today for ongoing assessment of toleration of diet; education w/ pt and family. Pt has been tolerating her mech soft diet w/ thin liquids since yesterday at noon; feels much better as more fluid has been removed and she feels her breathing is "better". Per CXR, pt has Pulmonary Edema. Pt has been tolerating the recommended mech soft diet (softer, cooked, cut foods) for easier oral intake and conservation of energy for better oral intake overall. Pt and husband are able to state aspiration precautions. NSG denied any po swallowing concerns. Pt and Husband were given education on general aspiration precautions; food consistencies and preparation for overall easier oral intake and to lessen exertion thus lessening WOB/SOB and fatigue. Also swallowing Pills in a Puree for safer swallowing at this time, and for discharge, is recommended - pt/husband agreed. No further skilled ST services indicated at this time. NSG to reconsult if indicated while pt is admitted. Pt/husband agreed.    HPI HPI: Pt is a 77 year old female with past medical history of multiple medical issues including Acid Reflux, paroxysmal atrial fibrillation status post pacemaker, previous history of TIA, hypertension, morbid obesity, diabetes, chronic kidney disease stage III, anxiety, GERD who presented to the hospital due to abdominal pain and noted to have partial small bowel obstruction.  Pt was extubated 04/14/18 after ~10 days.  Pt remains easily SOB w/ increased WOB w/ ANY exertion including talking, moving about in bed. Pt requires rest breaks to calm her breathing after exertion. Pt endorsed a somewhat reduced vocal quality post extubation. Per MD and pt/husband  report, pt has gained ~20-30 lbs of fluid w/ noted Pulmonary edema per CXR(Vascular congestion noted. Increased interstitial markings raise concern for pulmonary edema. Small bilateral pleural effusions). Pt is currently on Lasix.       SLP Plan  All goals met       Recommendations  Diet recommendations: Dysphagia 3 (mechanical soft);Thin liquid Liquids provided via: Cup;Straw(monitor any straw use) Medication Administration: Whole meds with puree(for safer swallowing) Supervision: Patient able to self feed;Intermittent supervision to cue for compensatory strategies Compensations: Minimize environmental distractions;Slow rate;Small sips/bites;Lingual sweep for clearance of pocketing;Follow solids with liquid Postural Changes and/or Swallow Maneuvers: Seated upright 90 degrees;Upright 30-60 min after meal(Rest Breaks during meals to lessen fatigue)                General recommendations: (Dietician f/u) Oral Care Recommendations: Oral care BID;Patient independent with oral care;Staff/trained caregiver to provide oral care Follow up Recommendations: None SLP Visit Diagnosis: Dysphagia, unspecified (R13.10) Plan: All goals met       GO                Orinda Kenner, MS, CCC-SLP Watson,Katherine 04/17/2018, 4:52 PM

## 2018-04-17 NOTE — Plan of Care (Signed)
  Problem: Skin Integrity: Goal: Risk for impaired skin integrity will decrease Outcome: Progressing   

## 2018-04-17 NOTE — Clinical Social Work Note (Signed)
Clinical Social Work Assessment  Patient Details  Name: Deborah Obrien MRN: [id number] Date of Birth: 15-Nov-1940  Date of referral:  04/17/18               Reason for consult:  Facility Placement                Permission sought to share information with:  Family Supports Permission granted to share information::  Yes, Verbal Permission Granted  Name::     [name], [name] [phone]  [phone] or [name], [name] [phone] or [name], [name] [phone] or [name], [name] [phone]   Agency::  SNF admissions  Relationship::     Contact Information:     Housing/Transportation Living arrangements for the past 2 months:  Single Family Home Source of Information:  Patient, Adult Children Patient Interpreter Needed:  None Criminal Activity/Legal Involvement Pertinent to Current Situation/Hospitalization:  No - Comment as needed Significant Relationships:  Adult Children, Spouse Lives with:  Spouse Do you feel safe going back to the place where you live?  No Need for family participation in patient care:  No (Coment)  Care giving concerns:  Patient feels she needs short term rehab before she is able to return back home.   Social Worker assessment / plan:  Patient is a 77 year old female who is alert and oriented x4 and married.  Patient states she has been to rehab before at [facility] and she is familiar with how insurance will pay for stay and what to expect at SNF.  CSW reminded patient about how insurance will pay for stay.  Patient expressed that she did not have any other questions or concerns about going to SNF for short term rehab.  Patient gave CSW permission to begin bed search in [place].  Employment status:  Retired Forensic scientist:  Medicare PT Recommendations:  [place] / Referral to community resources:  [place]  Patient/Family's Response to care:  Patient is in agreement to going to  SNF for short term rehab.  Patient/Family's Understanding of and Emotional Response to Diagnosis, Current Treatment, and Prognosis: Patient expressed that she is hopeful that she will not have to be at [facility] for very long.  Emotional Assessment Appearance:  Appears stated age Attitude/Demeanor/Rapport:    Affect (typically observed):  Appropriate, Stable, Pleasant Orientation:  Oriented to Self, Oriented to Place, Oriented to Situation, Oriented to  Time Alcohol / Substance use:  Not Applicable Psych involvement (Current and /or in the community):     Discharge Needs  Concerns to be addressed:  Care Coordination, Lack of Support Readmission within the last 30 days:  No Current discharge risk:  Lack of support system Barriers to Discharge:  Continued Medical Work up   [name] 04/17/2018, 3:23 PM

## 2018-04-17 NOTE — Progress Notes (Signed)
Pharmacy Antibiotic Note  Deborah Obrien is a 77 y.o. female admitted on 04/04/2018 with pneumonia.  Pharmacy has been consulted for cefazolin dosing. Patient previously received Unasyn and Vancomycin.  Plan: Continue cefazolin 1g IV Q8hr. Will discuss duration of therapy on 8/7.  Height: 5\' 6"  (167.6 cm) Weight: 289 lb 12.8 oz (131.5 kg) IBW/kg (Calculated) : 59.3  Temp (24hrs), Avg:98.1 F (36.7 C), Min:97.8 F (36.6 C), Max:98.4 F (36.9 C)  Recent Labs  Lab 04/11/18 1848 04/12/18 0129 04/13/18 0521 04/14/18 0906 04/15/18 9024 04/15/18 0736 04/16/18 0634 04/17/18 0524  WBC 7.5 8.5 7.5  --   --  10.7  --  9.6  CREATININE  --  1.62* 1.47* 1.49* 1.51*  --  1.63* 1.67*    Estimated Creatinine Clearance: 39.3 mL/min (A) (by C-G formula based on SCr of 1.67 mg/dL (H)).    Allergies  Allergen Reactions  . Cephalosporins Other (See Comments)    Reaction:  Unknown   . Macrolides And Ketolides Other (See Comments)    Reaction:  Unknown   . Meperidine Shortness Of Breath, Nausea Only and Other (See Comments)    Reaction:  Stomach pain   . Prednisone Anaphylaxis and Other (See Comments)    Reaction:  Increases pts blood sugar  Pt states that she is allergic to all steroids.    . Shellfish Allergy Anaphylaxis, Shortness Of Breath, Diarrhea, Nausea Only and Rash    Reaction:  Stomach pain   . Sulfa Antibiotics Shortness Of Breath, Diarrhea, Nausea Only and Other (See Comments)    Reaction:  Stomach pain   . Uloric [Febuxostat] Anaphylaxis    Locks pt's body up   . Aspirin Other (See Comments)    Reaction:  Stomach pain   . Celecoxib Other (See Comments)    Reaction:  GI bleed, weakness, and stomach pain.    . Cephalexin Diarrhea, Nausea Only and Other (See Comments)    Reaction:  Stomach pain   . Erythromycin Diarrhea, Nausea Only and Other (See Comments)    Reaction:  Stomach pain   . Hydromorphone Other (See Comments)    Reaction:  Hypotension   . Iodinated  Diagnostic Agents Rash and Other (See Comments)    Pt states that she is unable to have because she has chronic kidney disease.    Marland Kitchen Oxycodone Other (See Comments)    Reaction:  Stomach pain   . Atorvastatin Nausea Only and Other (See Comments)    Reaction:  Weakness   . Codeine Diarrhea, Nausea Only and Other (See Comments)    Reaction:  Stomach pain Pt tolerates morphine   . Doxycycline     Stomach pain   . Tape Other (See Comments)    Reaction:  Causes pts skin to tear  Pt states that she is able to use paper tape.    . Valdecoxib Swelling and Other (See Comments)    Pt states that her hands and feet swell.    . Iodine Rash    Antimicrobials this admission: Zosyn 7/29 x 1  Unasyn 7/29 >> 8/1 Vancomycin 7/29 >> 7/31 Cefazolin 8/1 >>  Dose adjustments this admission: N/A  Microbiology results: 7/29 Sputum: klebsiella 7/26  MRSA PCR: negative  8/1 Cdiff Scan: negative   Thank you for allowing pharmacy to be a part of this patient's care.  Paticia Stack, PharmD Pharmacy Resident  04/17/2018 3:36 PM

## 2018-04-17 NOTE — Progress Notes (Addendum)
Pharmacy Electrolyte Monitoring Consult:  Pharmacy consulted to assist in monitoring and replacing electrolytes and glucose management in this 77 y.o. female admitted on 04/04/2018 with Hematemesis and Abdominal Pain   Labs:  Sodium (mmol/L)  Date Value  04/17/2018 143  10/16/2014 141   Potassium (mmol/L)  Date Value  04/17/2018 3.9  10/16/2014 4.6   Magnesium (mg/dL)  Date Value  04/17/2018 1.7   Phosphorus (mg/dL)  Date Value  04/16/2018 4.3   Calcium (mg/dL)  Date Value  04/17/2018 8.2 (L)   Calcium, Total (mg/dL)  Date Value  10/16/2014 8.5   Albumin (g/dL)  Date Value  04/13/2018 2.0 (L)  10/29/2012 3.9    Assessment/Plan: Continue Lasix 60 mg IV every 8 hours.  Reviewed patient labs; have ordered replacement of KCl 20 mEq PO x 1. Goal potassium of ~4.0 (today 3.9; 8/5 was 4.9). Mg = 1.7, ordered Mg sulfate 2 g IV x 1 (goal Mg ~2.0) Will continue to monitor BMP for electrolyte abnormalities.  Pharmacy will continue to monitor and adjust per consult.   Paticia Stack, PharmD Pharmacy Resident  04/17/2018 8:24 AM

## 2018-04-17 NOTE — Progress Notes (Signed)
[facility] NAME: Deborah Obrien    MR#:  [id number]  DATE OF BIRTH:  11-22-1940  SUBJECTIVE:   Patient is c/o rt hip pain. Family  is at bedside.  Denies any fluttering.  Tolerating modified diet no further acute bleeding.  REVIEW OF SYSTEMS:    Review of Systems  Constitutional: Negative for chills and fever.  HENT: Negative for congestion and tinnitus.   Eyes: Negative for blurred vision and double vision.  Respiratory: Negative for cough, shortness of breath and wheezing.   Cardiovascular: Negative for chest pain, orthopnea and PND.  Gastrointestinal: Negative for abdominal pain, diarrhea, nausea and vomiting.  Genitourinary: Negative for dysuria and hematuria.  Neurological: Negative for dizziness, sensory change and focal weakness.  All other systems reviewed and are negative.   Nutrition: NPO Tolerating Diet: no Tolerating PT: Await Eval.   DRUG ALLERGIES:   Allergies  Allergen Reactions  . Cephalosporins Other (See Comments)    Reaction:  Unknown   . Macrolides And Ketolides Other (See Comments)    Reaction:  Unknown   . Meperidine Shortness Of Breath, Nausea Only and Other (See Comments)    Reaction:  Stomach pain   . Prednisone Anaphylaxis and Other (See Comments)    Reaction:  Increases pts blood sugar  Pt states that she is allergic to all steroids.    . Shellfish Allergy Anaphylaxis, Shortness Of Breath, Diarrhea, Nausea Only and Rash    Reaction:  Stomach pain   . Sulfa Antibiotics Shortness Of Breath, Diarrhea, Nausea Only and Other (See Comments)    Reaction:  Stomach pain   . Uloric [Febuxostat] Anaphylaxis    Locks pt's body up   . Aspirin Other (See Comments)    Reaction:  Stomach pain   . Celecoxib Other (See Comments)    Reaction:  GI bleed, weakness, and stomach pain.    . Cephalexin Diarrhea, Nausea Only and Other (See Comments)    Reaction:  Stomach pain   . Erythromycin Diarrhea, Nausea  Only and Other (See Comments)    Reaction:  Stomach pain   . Hydromorphone Other (See Comments)    Reaction:  Hypotension   . Iodinated Diagnostic Agents Rash and Other (See Comments)    Pt states that she is unable to have because she has chronic kidney disease.    Marland Kitchen Oxycodone Other (See Comments)    Reaction:  Stomach pain   . Atorvastatin Nausea Only and Other (See Comments)    Reaction:  Weakness   . Codeine Diarrhea, Nausea Only and Other (See Comments)    Reaction:  Stomach pain Pt tolerates morphine   . Doxycycline     Stomach pain   . Tape Other (See Comments)    Reaction:  Causes pts skin to tear  Pt states that she is able to use paper tape.    . Valdecoxib Swelling and Other (See Comments)    Pt states that her hands and feet swell.    . Iodine Rash    VITALS:  Blood pressure 129/84, pulse (!) 119, temperature 97.9 F (36.6 C), temperature source Oral, resp. rate 19, height 5\' 6"  (1.676 m), weight 131.5 kg (289 lb 12.8 oz), SpO2 95 %.  PHYSICAL EXAMINATION:   Physical Exam  GENERAL:  77 y.o.-year-old obese patient lying in bed awake and alert and in NAD.  EYES: Pupils equal, round, reactive to light. No scleral icterus. Extraocular muscles intact.  HEENT: Head  atraumatic, normocephalic.  NECK:  Supple, no jugular venous distention. No thyroid enlargement, no tenderness.  LUNGS: Normal breath sounds bilaterally, no wheezing, rales, rhonchi. No use of accessory muscles of respiration.  CARDIOVASCULAR: Irregularly regular no murmurs, rubs, or gallops.  ABDOMEN: Soft, NT, ND. Bowel sounds present.  EXTREMITIES: No cyanosis, clubbing, + 1-2 edema b/l NEUROLOGIC: Cranial nerves II through XII intact, no focal motor or sensory deficits appreciated bilaterally. Globally weak.  PSYCHIATRIC: Alert awake oriented x 3.  Follows simple commands. SKIN: No obvious rash, lesion, or ulcer.   LABORATORY PANEL:   CBC Recent Labs  Lab 04/17/18 0524  WBC 9.6  HGB 8.9*  HCT  26.5*  PLT 262   ------------------------------------------------------------------------------------------------------------------  Chemistries  Recent Labs  Lab 04/17/18 0524  NA 143  K 3.9  CL 104  CO2 27  GLUCOSE 273*  BUN 29*  CREATININE 1.67*  CALCIUM 8.2*  MG 1.7   ------------------------------------------------------------------------------------------------------------------  Cardiac Enzymes No results for input(s): TROPONINI in the last 168 hours. ------------------------------------------------------------------------------------------------------------------  RADIOLOGY:  Dg Chest Port 1 View  Result Date: 04/16/2018 CLINICAL DATA:  Acute onset of dyspnea. EXAM: PORTABLE CHEST 1 VIEW COMPARISON:  Chest radiograph performed 04/12/2018 FINDINGS: The lungs are well-aerated. Vascular congestion is noted. Increased interstitial markings raise concern for pulmonary edema. Small bilateral pleural effusions are noted. No pneumothorax is seen. The cardiomediastinal silhouette is normal in size. A pacemaker is noted overlying the left chest wall, with leads ending overlying the right atrium and right ventricle. No acute osseous abnormalities are seen. The patient's right PICC is noted ending about the proximal SVC. Cervical spinal fusion hardware is noted. IMPRESSION: Vascular congestion noted. Increased interstitial markings raise concern for pulmonary edema. Small bilateral pleural effusions noted. Electronically Signed   By: Garald Balding M.D.   On: 04/16/2018 03:56     ASSESSMENT AND PLAN:   77 year old female with past medical history of paroxysmal atrial fibrillation status post pacemaker, previous history of TIA, hypertension, morbid obesity, diabetes, chronic kidney disease stage III, anxiety, GERD who presented to the hospital due to abdominal pain and noted to have partial small bowel obstruction.   - .Atrial fibrillation with rapid ventricular response- she has now  converted to normal sinus rhythm, appreciate cardiology input.   Continue amiodarone drip for now.  Holding Eliquis given melena/GI bleed.   IV Lopressor as needed for rate control. Metoprolol 25 mg p.o. twice daily is added to the regimen  -Acute on chronic respiratory failure with hypoxia and hypercapnia- due to underlying COPD, morbid obesity, Aspiration Pneumonia. -Patient extubated 04/13/18 and Doing well from the respiratory standpoint.  Patient's sputum cultures were positive for Klebsiella. cont. IV ancef and will switch to oral antibiotics  -Acute on chronic diastolic CHF with volume overload Patient has gained 30 to 40 pounds over her dry weight. Lasix 60 mg IV every 8 hours, cardiology is recommending to continue for the next few days  -Right hip pain will get an x-ray and pain management as needed   -Hypoalbuminemia with third spacing-IV albumin  -.  GI bleed- patient was noted to have some dark/melanotic stools .  Hemoglobin had dropped to 7.0 and therefore patient received 1 unit of packed red blood cells. Hg up to 9.5 today and stable and no bleeding overnight.  -A gastroenterology consult obtained and patient underwent an upper GI endoscopy which showed a gastric polyp which was bleeding and it was injected.  Bleeding has stopped.   Hemoglobin stable, continue PPI. - Patient  tolerating PO , dysphagia 3 diet  -.  Acute partial small bowel obstruction- cause of patient's worsening abdominal pain on admission but now resolved. NO acute issue  -.  Acute blood loss anemia- initially thought to be secondary to Mallory-Weiss tear, but patient has history of Hyperplastic polyps.  Status post upper GI endoscopy yesterday which showed a bleeding gastric polyp which was injected. -Hemoglobin improved posttransfusion stable at 9.5 and stable.  No other acute bleeding overnight.  -  Hypernatremia-  sodium normalized now.  -  Aspiration pneumonia- pt. Was on IV Unasyn.  Sputum cultures  were positive for Klebsiella and its pansensitive.   - cont. Ancef and switch to Oral when pt. Can take PO.  - High risk for aspiration.  Seen by speech therapy recommending dysphagia 3 diet with thin liquids  8. Hyperkalemia - resolved  PT eval-skilled nursing facility  All the records are reviewed and case discussed with Care Management/Social Worker. Management plans discussed with the patient, family and they are in agreement.  CODE STATUS: Full code  DVT Prophylaxis: Ted's & SCD's.   TOTAL TIME TAKING CARE OF THIS PATIENT: 33 minutes.   POSSIBLE D/C unclear, DEPENDING ON CLINICAL CONDITION and course.   Nicholes Mango M.D on 04/17/2018 at 2:42 PM  Between 7am to 6pm - Pager - [phone]   After 6pm go to [email or web address] - [employer] [facility]  Office  [phone]  CC: Primary care physician; [name], MD

## 2018-04-17 NOTE — Progress Notes (Signed)
Progress Note  Patient Name: Deborah Obrien Date of Encounter: 04/17/2018  Primary Cardiologist: Caryl Comes  Subjective   Up sitting in bed eating eggs with ham. SOB improving, though remains on supplemental oxygen via nasal cannula at 2 L. Documented UOP of 3.1 L for the past 24 hours. Remains net + 16.8 L for the admission. Weight 292-->289 pounds. Renal function relatively stable with increased diuresis 1.63-->1.67. Potassium 4.9-->3.9. HGB 9.5-->8.9. Magnesium 1.7. No BM in 3 days. Passing gas from below.   Inpatient Medications    Scheduled Meds: . busPIRone  10 mg Oral BID  . docusate sodium  100 mg Oral Daily  . furosemide  60 mg Intravenous Q8H  . insulin aspart  0-15 Units Subcutaneous TID WC  . insulin aspart  0-5 Units Subcutaneous QHS  . ipratropium-albuterol  3 mL Nebulization Q6H  . mouth rinse  15 mL Mouth Rinse BID  . metoprolol tartrate  5 mg Intravenous Q8H  . multivitamin with minerals  1 tablet Oral Daily  . pantoprazole  40 mg Oral BID AC  . PARoxetine  20 mg Oral Daily  . protein supplement shake  11 oz Oral BID BM  . sodium chloride flush  10-40 mL Intracatheter Q12H  . traZODone  50 mg Oral QHS   Continuous Infusions: . amiodarone 30 mg/hr (04/17/18 0533)  .  ceFAZolin (ANCEF) IV 1 g (04/17/18 0534)   PRN Meds: acetaminophen **OR** acetaminophen, benzocaine-Menthol, dibucaine, ondansetron **OR** ondansetron (ZOFRAN) IV, phenol, polyvinyl alcohol, sodium chloride flush, witch hazel-glycerin   Vital Signs    Vitals:   04/17/18 0334 04/17/18 0530 04/17/18 0740 04/17/18 0752  BP:  127/64 129/84   Pulse:  (!) 126 (!) 119   Resp:   19   Temp:   97.9 F (36.6 C)   TempSrc:   Oral   SpO2:   94% 95%  Weight: 289 lb 12.8 oz (131.5 kg)     Height:        Intake/Output Summary (Last 24 hours) at 04/17/2018 0818 Last data filed at 04/17/2018 0333 Gross per 24 hour  Intake -  Output 3100 ml  Net -3100 ml   Filed Weights   04/15/18 0500 04/16/18 0315  04/17/18 0334  Weight: 279 lb 8.7 oz (126.8 kg) 292 lb 6.4 oz (132.6 kg) 289 lb 12.8 oz (131.5 kg)    Telemetry    Intermittent episodes of Afib with RVR and sinus rhythm with pacing, currently in Afib with RVR with ventricular rates int the 120s bpm - Personally Reviewed  ECG    n/a - Personally Reviewed  Physical Exam   GEN: No acute distress.   Neck: JVD difficult to assess given body habitus. Cardiac: RRR, no murmurs, rubs, or gallops.  Respiratory: Diminished breath sounds bilaterally with crackles along the bilateral bases.  GI: Soft, nontender, non-distended.   MS: 1-2+ bilateral pitting lower extremity edema; No deformity. Neuro:  Alert and oriented x 3; Nonfocal.  Psych: Normal affect.  Labs    Chemistry Recent Labs  Lab 04/13/18 [phone]  04/15/18 3810 04/16/18 0634 04/17/18 0524  NA 143   < > 143 144 143  K 3.1*   < > 3.8 4.9 3.9  CL 109   < > 108 107 104  CO2 29   < > 25 21* 27  GLUCOSE 144*   < > 131* 204* 273*  BUN 44*   < > 33* 33* 29*  CREATININE 1.47*   < > 1.51*  1.63* 1.67*  CALCIUM 7.4*   < > 8.0* 8.3* 8.2*  ALBUMIN 2.0*  --   --   --   --   GFRNONAA 33*   < > 32* 29* 28*  GFRAA 39*   < > 37* 34* 33*  ANIONGAP 5   < > 10 16* 12   < > = values in this interval not displayed.     Hematology Recent Labs  Lab 04/13/18 0521 04/15/18 0736 04/17/18 0524  WBC 7.5 10.7 9.6  RBC 2.85* 2.87* 2.79*  HGB 9.2* 9.5* 8.9*  HCT 27.1* 27.4* 26.5*  MCV 95.0 95.7 94.9  MCH 32.2 33.2 31.9  MCHC 33.9 34.7 33.6  RDW 16.1* 16.1* 16.5*  PLT 154 177 262    Cardiac EnzymesNo results for input(s): TROPONINI in the last 168 hours. No results for input(s): TROPIPOC in the last 168 hours.   BNPNo results for input(s): BNP, PROBNP in the last 168 hours.   DDimer No results for input(s): DDIMER in the last 168 hours.   Radiology    Dg Chest Port 1 View  Result Date: 04/16/2018 IMPRESSION: Vascular congestion noted. Increased interstitial markings raise concern  for pulmonary edema. Small bilateral pleural effusions noted. Electronically Signed   By: Garald Balding M.D.   On: 04/16/2018 03:56    Cardiac Studies   Echo 04/10/2018: Study Conclusions  - Left ventricle: The cavity size was normal. Wall thickness was normal. Systolic function was normal. The estimated ejection fraction was in the range of 60% to 65%. - Mitral valve: Moderately thickened, mildly calcified leaflets . - Left atrium: The atrium was mildly dilated. - Right ventricle: The cavity size was mildly to moderately dilated. Wall thickness was mildly increased. - Right atrium: The atrium was mildly dilated. - Tricuspid valve: There was moderate regurgitation. - Pulmonary arteries: Systolic pressure was moderately increased, estimated to be 50 mm Hg plus central venous/right atrial pressure.  Patient Profile     77 y.o. 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  Assessment & Plan    1. PAF with RVR: -Continues to have intermittent episodes of Afib with RVR with ventricular rates into the 120s bpm with brief episodes of conversion to sinus rhythm/pacing -Remains on IV amiodarone gtt given she has not had a BM in 3 days with SBO -Transition to PO medications when able s/p BM -Continue IV amiodarone -Continue IV metoprolol 5 mg tid for ventricular rate > 110 bpm -If tachycardic rates persist, she may need Cardizem gtt (may worsen her constipation)  -Not currently a candidate for anticoagulation given GI bleed and anemia   2. Acute respiratory failure with hypoxia and hypercapnia: -Now extubated and transferred to telemetry  -Multifactorial in the setting of underlying severe COPD,pulmonary hypertension, acute on chronic diastolic CHF,morbid obesity, and possible OHS with possible aspiration pneumonia -Consider PE evaluation as renal function  improves, defer to IM -Wean oxygen as able -Needs PT  3. Acute on chronic diastolic CHF/pleural effusions: -She remains significantly volume overloaded and is up ~ 30-40 pounds -Likely exacerbated by third spacing from her persistent anemia and hypoalbuminemia  -Cannot exclude RV failure -Continue aggressive IV diuresis with Lasix 60 mg q 8 hours with KCl repletion -May need milrinone for RV support to augment diuresis if renal function worsens  -She will need multiple more days of inpatient IV diuresis   4. Partial  SBO: -No BM in 3 days, positive flatulence  -Will need to remain on IV medications until BM -Per IM -Ambulation should help  5. Acute blood loss anemia/upper GI bleed: -No further noted bloody BMs -HGB relatively stable, continue to monitor and maintain a level > 8.5 -Per IM  6. AKI: -Renal function relatively stable   7. Hypoalbuminemia: -Received IV albumin on 7/31 -Now eating a soft diet, this will help significantly  -Trend albumin  8. Disp: -She will require several more days of aggressive, inpatient IV diuresis along with PT    For questions or updates, please contact [facility] Please consult [email or web address] for contact info under Cardiology/STEMI.    Signed, Christell Faith, PA-C [facility] Pager: [phone] 04/17/2018, 8:18 AM

## 2018-04-17 NOTE — Progress Notes (Signed)
Inpatient Diabetes Program Recommendations  AACE/ADA: New Consensus Statement on Inpatient Glycemic Control (2019)  Target Ranges:  Prepandial:   less than 140 mg/dL      Peak postprandial:   less than 180 mg/dL (1-2 hours)      Critically ill patients:  140 - 180 mg/dL  Results for Deborah Obrien, Deborah Obrien (MRN [id number]) as of 04/17/2018 08:57  Ref. Range 04/16/2018 08:34 04/16/2018 12:01 04/16/2018 17:00 04/16/2018 21:05 04/17/2018 07:42  Glucose-Capillary Latest Ref Range: 70 - 99 mg/dL 191 (H) 199 (H) 280 (H) 253 (H) 273 (H)    Review of Glycemic Control  Outpatient Diabetes medications: 70/30 22 units BID Current orders for Inpatient glycemic control: Novolog 0-15 units TID with meals, Novolog 0-5 units QHS  Inpatient Diabetes Program Recommendations: Insulin - Basal: Please consider ordering Lantus 8 units Q24H.  Thanks, Barnie Alderman, RN, MSN, CDE Diabetes Coordinator Inpatient Diabetes Program [phone] (Team Pager from 8am to 5pm)

## 2018-04-17 NOTE — Plan of Care (Signed)
  Problem: Pain Managment: Goal: General experience of comfort will improve Outcome: Progressing   

## 2018-04-17 NOTE — NC FL2 (Signed)
[place] LEVEL OF CARE SCREENING TOOL     IDENTIFICATION  Patient Name: Deborah Obrien Birthdate: 03-25-1941 Sex: female Admission Date (Current Location): 04/04/2018  [place] and [place] Number:  Engineering geologist and Address:  [facility], [address] [place], [place] [address]      Provider Number: [id number]  Attending Physician Name and Address:  Nicholes Mango, MD  Relative Name and Phone Number:  Karren, [place] Spouse [phone]  [phone] or Marieann, Zipp [phone] or Mileigh, Tilley [phone] or Demiah, Gullickson [phone]     Current Level of Care: Hospital Recommended Level of Care: [place] Prior Approval Number:    Date Approved/Denied:   PASRR Number: [id number]  Discharge Plan: SNF    Current Diagnoses: Patient Active Problem List   Diagnosis Date Noted  . Acute respiratory failure ([place]) 04/09/2018  . Chronic anticoagulation 04/09/2018  . Pacemaker 04/09/2018  . H/O TIA (transient ischemic attack) and stroke 04/09/2018  . CAD (coronary artery disease) 04/09/2018  . Hematemesis 04/05/2018  . Hypotension 02/04/2018  . Atrial fibrillation with RVR ([place]) 02/04/2018  . Atrial flutter ([place]) 08/24/2017  . Sinus node dysfunction ([place]) 08/24/2017  . Dizziness 06/12/2017  . Mastalgia 02/14/2017  . Acute on chronic renal insufficiency 01/09/2017  . Fatty liver 01/09/2017  . Benign essential HTN 09/08/2016  . Nephrolithiasis 02/22/2016  . Diabetic neuropathy with neurologic complication ([place]) 68/08/7516  . Moderate episode of recurrent major depressive disorder ([place]) 08/11/2015  . Gastritis without bleeding   . Gastric polyp   . Gastro-esophageal reflux disease without esophagitis   . PAF (paroxysmal atrial fibrillation) ([place]) 06/29/2015  . Intestinal obstruction ([place]) 02/20/2015  . DDD (degenerative disc disease), lumbar 01/19/2015  . Back pain 01/12/2015  . Gout  01/12/2015  . Complete tear of left rotator cuff 10/16/2014  . Intervertebral disc disorder with radiculopathy of lumbar region 09/17/2014  . Lumbar radiculitis 09/17/2014  . Lumbar stenosis with neurogenic claudication 09/17/2014  . Neuritis or radiculitis due to rupture of lumbar intervertebral disc 09/17/2014  . Atherosclerosis of both carotid arteries 07/15/2014  . Essential hypertension 07/15/2014  . Hyperlipemia, mixed 07/15/2014  . 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 Body mass index (BMI) of 40.0-44.9 in adult ([place]) 06/21/2014  . Mixed incontinence 06/25/2013  . Mixed urge and stress incontinence 06/25/2013  . Nocturia 06/25/2013  . Blurring of visual image 04/11/2012    Orientation RESPIRATION BLADDER Height & Weight     Self, Time, Situation, Place  O2(3L) Continent Weight: 289 lb 12.8 oz (131.5 kg) Height:  5\' 6"  (167.6 cm)  BEHAVIORAL SYMPTOMS/MOOD NEUROLOGICAL BOWEL NUTRITION STATUS      Continent Diet(Dysphagia 3 diet)  AMBULATORY STATUS COMMUNICATION OF NEEDS Skin   Limited Assist Verbally Surgical wounds                       Personal Care Assistance Level of Assistance  Bathing, Feeding, Dressing Bathing Assistance: Limited assistance Feeding assistance: Independent Dressing Assistance: Limited assistance     Functional Limitations Info  Sight, Hearing, Speech Sight Info: Adequate Hearing Info: Adequate Speech Info: Adequate    SPECIAL CARE FACTORS FREQUENCY  PT (By licensed PT), OT (By licensed OT)     PT Frequency: 5x a week OT Frequency: 5x a week            Contractures Contractures Info: Not present    Additional Factors Info  Code Status, Allergies, Psychotropic, Insulin Sliding Scale Code Status  Info: Full Code Allergies Info: CEPHALOSPORINS, MACROLIDES AND KETOLIDES, MEPERIDINE, PREDNISONE, SHELLFISH ALLERGY, SULFA ANTIBIOTICS, ULORIC FEBUXOSTAT, ASPIRIN, CELECOXIB, CEPHALEXIN, ERYTHROMYCIN, HYDROMORPHONE, IODINATED  DIAGNOSTIC AGENTS, OXYCODONE, ATORVASTATIN, CODEINE, DOXYCYCLINE, TAPE, VALDECOXIB, IODINE  Psychotropic Info: busPIRone (BUSPAR) tablet 10 mg and PARoxetine (PAXIL) tablet 20 mg  Insulin Sliding Scale Info: insulin aspart (novoLOG) injection 0-15 Units 3x a day with meals       Current Medications (04/17/2018):  This is the current hospital active medication list Current Facility-Administered Medications  Medication Dose Route Frequency Provider Last Rate Last Dose  . acetaminophen (TYLENOL) tablet 650 mg  650 mg Oral Q6H PRN Harrie Foreman, MD       Or  . acetaminophen (TYLENOL) suppository 650 mg  650 mg Rectal Q6H PRN Harrie Foreman, MD   650 mg at 04/07/18 1705  . amiodarone (NEXTERONE PREMIX) 360-4.14 MG/200ML-% (1.8 mg/mL) IV infusion  30 mg/hr Intravenous Continuous Awilda Bill, NP 16.7 mL/hr at 04/17/18 0533 30 mg/hr at 04/17/18 0533  . benzocaine-Menthol (DERMOPLAST) 20-0.5 % topical spray 1 application  1 application Topical QID PRN Conforti, [name], DO   1 application at 86/76/72 1033  . busPIRone (BUSPAR) tablet 10 mg  10 mg Oral BID Conforti, [name], DO   10 mg at 04/17/18 0810  . ceFAZolin (ANCEF) IVPB 1 g/50 mL premix  1 g Intravenous Q8H Kasa, Maretta Bees, MD 100 mL/hr at 04/17/18 0534 1 g at 04/17/18 0534  . dibucaine (NUPERCAINAL) 1 % rectal ointment   Rectal PRN Conforti, [name], DO      . docusate sodium (COLACE) capsule 100 mg  100 mg Oral Daily Conforti, [name], DO   100 mg at 04/17/18 0810  . furosemide (LASIX) injection 60 mg  60 mg Intravenous Q8H Kathlyn Sacramento A, MD   60 mg at 04/17/18 0533  . insulin aspart (novoLOG) injection 0-15 Units  0-15 Units Subcutaneous TID WC Conforti, [name], DO   8 Units at 04/17/18 0809  . insulin aspart (novoLOG) injection 0-5 Units  0-5 Units Subcutaneous QHS Conforti, [name], DO   3 Units at 04/16/18 2130  . ipratropium-albuterol (DUONEB) 0.5-2.5 (3) MG/3ML nebulizer solution 3 mL  3 mL Nebulization Q6H Tukov-Yual, Magdalene S, NP   3 mL  at 04/17/18 0752  . MEDLINE mouth rinse  15 mL Mouth Rinse BID Conforti, [name], DO   15 mL at 04/17/18 0947  . metoprolol tartrate (LOPRESSOR) injection 5 mg  5 mg Intravenous Q8H Dunn, Ryan M, PA-C   5 mg at 04/17/18 0533  . metoprolol tartrate (LOPRESSOR) tablet 25 mg  25 mg Oral BID Kathlyn Sacramento A, MD   25 mg at 04/17/18 1014  . multivitamin with minerals tablet 1 tablet  1 tablet Oral Daily Gouru, Aruna, MD   1 tablet at 04/17/18 0808  . ondansetron (ZOFRAN) tablet 4 mg  4 mg Oral Q6H PRN Harrie Foreman, MD       Or  . ondansetron [facility]) injection 4 mg  4 mg Intravenous Q6H PRN Harrie Foreman, MD   4 mg at 04/06/18 2340  . pantoprazole (PROTONIX) EC tablet 40 mg  40 mg Oral BID AC Conforti, [name], DO   40 mg at 04/17/18 0808  . PARoxetine (PAXIL) tablet 20 mg  20 mg Oral Daily Conforti, [name], DO   20 mg at 04/17/18 0810  . phenol (CHLORASEPTIC) mouth spray 1 spray  1 spray Mouth/Throat PRN Salary, Montell D, MD      . polyvinyl alcohol (LIQUIFILM TEARS)  1.4 % ophthalmic solution 1 drop  1 drop Left Eye PRN Conforti, [name], DO   1 drop at 04/14/18 1157  . protein supplement (PREMIER PROTEIN) liquid - approved for s/p bariatric surgery  11 oz Oral BID BM Gouru, Aruna, MD   11 oz at 04/17/18 1017  . sodium chloride flush (NS) 0.9 % injection 10-40 mL  10-40 mL Intracatheter Q12H Conforti, [name], DO   10 mL at 04/17/18 0809  . sodium chloride flush (NS) 0.9 % injection 10-40 mL  10-40 mL Intracatheter PRN Conforti, [name], DO   10 mL at 04/16/18 0857  . traZODone (DESYREL) tablet 50 mg  50 mg Oral QHS Conforti, [name], DO   50 mg at 04/16/18 2131  . witch hazel-glycerin (TUCKS) pad   Topical PRN Hermelinda Dellen, DO         Discharge Medications: Please see discharge summary for a list of discharge medications.  Relevant Imaging Results:  Relevant Lab Results:   Additional Information SSN [id number]  Ross Ludwig, [place]

## 2018-04-17 NOTE — Clinical Social Work Note (Signed)
CSW presented bed offers to patient and her family they have chosen to go to [facility] for short term rehab.  CSW spoke to [facility] and they can accept patient once she is medically ready for discharge and orders have been received.  CSW to continue to follow patient's progress throughout discharge planning.  Jones Broom. [place], MSW, [place]  04/17/2018 3:20 PM

## 2018-04-18 LAB — GLUCOSE, CAPILLARY
Glucose-Capillary: 347 mg/dL — ABNORMAL HIGH (ref 70–99)
Glucose-Capillary: 317 mg/dL — ABNORMAL HIGH (ref 70–99)
Glucose-Capillary: 95 mg/dL (ref 70–99)
Glucose-Capillary: 188 mg/dL — ABNORMAL HIGH (ref 70–99)

## 2018-04-18 LAB — COMPREHENSIVE METABOLIC PANEL
ALT: <5 U/L (ref 0–44)
ANION GAP: 12 (ref 5–15)
AST: 25 U/L (ref 15–41)
Albumin: 2.4 g/dL — ABNORMAL LOW (ref 3.5–5.0)
Alkaline Phosphatase: 134 U/L — ABNORMAL HIGH (ref 38–126)
BILIRUBIN TOTAL: 0.8 mg/dL (ref 0.3–1.2)
BUN: 26 mg/dL — ABNORMAL HIGH (ref 8–23)
CALCIUM: 8 mg/dL — AB (ref 8.9–10.3)
CO2: 34 mmol/L — ABNORMAL HIGH (ref 22–32)
Chloride: 96 mmol/L — ABNORMAL LOW (ref 98–111)
Creatinine, Ser: 1.59 mg/dL — ABNORMAL HIGH (ref 0.44–1.00)
GFR, EST AFRICAN AMERICAN: 35 mL/min — AB (ref >60)
GFR, EST NON AFRICAN AMERICAN: 30 mL/min — AB (ref >60)
GLUCOSE: 311 mg/dL — AB (ref 70–99)
Potassium: 3.5 mmol/L (ref 3.5–5.1)
Sodium: 142 mmol/L (ref 135–145)
TOTAL PROTEIN: 6.2 g/dL — AB (ref 6.5–8.1)

## 2018-04-18 LAB — CBC
HEMATOCRIT: 27.6 % — AB (ref 35.0–47.0)
Hemoglobin: 9.1 g/dL — ABNORMAL LOW (ref 12.0–16.0)
MCH: 31.3 pg (ref 26.0–34.0)
MCHC: 32.8 g/dL (ref 32.0–36.0)
MCV: 95.3 fL (ref 80.0–100.0)
Platelets: 289 10*3/uL (ref 150–440)
RBC: 2.9 MIL/uL — ABNORMAL LOW (ref 3.80–5.20)
RDW: 16.1 % — ABNORMAL HIGH (ref 11.5–14.5)
WBC: 5.7 10*3/uL (ref 3.6–11.0)

## 2018-04-18 LAB — MAGNESIUM: Magnesium: 1.9 mg/dL (ref 1.7–2.4)

## 2018-04-18 MED ORDER — BISACODYL 10 MG RE SUPP: 10.0000 mg | Freq: Every day | RECTAL | Status: DC | PRN

## 2018-04-18 MED ORDER — METOPROLOL TARTRATE 5 MG/5ML IV SOLN: 5.0000 mg | Freq: Three times a day (TID) | INTRAVENOUS | Status: DC | PRN

## 2018-04-18 MED ORDER — POTASSIUM CHLORIDE CRYS ER 20 MEQ PO TBCR
40.0000 meq | EXTENDED_RELEASE_TABLET | Freq: Once | ORAL | Status: AC
  Administered 2018-04-18: 40 meq via ORAL
  Filled 2018-04-18: qty 2

## 2018-04-18 MED ORDER — POLYETHYLENE GLYCOL 3350 17 G PO PACK
17.0000 g | PACK | Freq: Every day | ORAL | Status: DC
  Administered 2018-04-18 – 2018-04-23 (×6): 17 g via ORAL
  Filled 2018-04-18 (×6): qty 1

## 2018-04-18 MED ORDER — IPRATROPIUM-ALBUTEROL 0.5-2.5 (3) MG/3ML IN SOLN
3.0000 mL | Freq: Two times a day (BID) | RESPIRATORY_TRACT | Status: DC
  Administered 2018-04-18 – 2018-04-23 (×11): 3 mL via RESPIRATORY_TRACT
  Filled 2018-04-18 (×11): qty 3

## 2018-04-18 MED ORDER — INSULIN GLARGINE 100 UNIT/ML ~~LOC~~ SOLN
10.0000 [IU] | Freq: Every day | SUBCUTANEOUS | Status: DC
  Administered 2018-04-18 – 2018-04-19 (×2): 10 [IU] via SUBCUTANEOUS
  Filled 2018-04-18 (×2): qty 0.1

## 2018-04-18 MED ORDER — POTASSIUM CHLORIDE CRYS ER 20 MEQ PO TBCR
20.0000 meq | EXTENDED_RELEASE_TABLET | Freq: Once | ORAL | Status: AC
  Administered 2018-04-18: 20 meq via ORAL
  Filled 2018-04-18: qty 1

## 2018-04-18 MED ORDER — ALTEPLASE 2 MG IJ SOLR
2.0000 mg | Freq: Once | INTRAMUSCULAR | Status: AC
  Administered 2018-04-18: 2 mg
  Filled 2018-04-18: qty 2

## 2018-04-18 MED ORDER — INSULIN ASPART 100 UNIT/ML ~~LOC~~ SOLN
4.0000 [IU] | Freq: Three times a day (TID) | SUBCUTANEOUS | Status: DC
  Administered 2018-04-18 – 2018-04-23 (×16): 4 [IU] via SUBCUTANEOUS
  Filled 2018-04-18 (×16): qty 1

## 2018-04-18 MED ORDER — ALBUMIN HUMAN 25 % IV SOLN
12.5000 g | Freq: Two times a day (BID) | INTRAVENOUS | Status: AC
  Administered 2018-04-18 – 2018-04-19 (×4): 12.5 g via INTRAVENOUS
  Filled 2018-04-18 (×7): qty 50

## 2018-04-18 NOTE — Progress Notes (Signed)
Physical Therapy Treatment Patient Details Name: Deborah Obrien MRN: [id number] DOB: [birth year] Today's Date: 04/18/2018    History of Present Illness 77 year old female with past medical history of paroxysmal atrial fibrillation status post pacemaker, previous history of TIA, hypertension, morbid obesity, diabetes, chronic kidney disease stage III, anxiety, GERD who presented to the hospital due to abdominal pain and noted to have partial small bowel obstruction.  Pt was extubated 04/13/18.    PT Comments    Patient alert with nursing at bedside at start of session, vitals stable. Patient had minimal complaints of low back pain while supine in bed. Patient able to perform a few bed level exercises with AROM, more effort on RLE > LLE. Patient demonstrated rolling to R with supervision and verbal cues for hand placement/LE management. sidelying to sit with minAx1 for trunk elevation. Patient able to sit EOB for ~6-31minutes during session to perform some seated exercises as well as sit <> stand x2. Sit <> stand with RW and minAx1, second attempt patient able to take shuffling steps to chair with CGA and RW ~52ft. Patient had 1-2 instances of desat to 88%, able to recover with deep breathing/improved posture/rest breaks. Patient on O2 via [place] at 2L throughout session, up in chair spO2 > 92% at end of session with nursing at bedside.      Follow Up Recommendations  SNF     Equipment Recommendations  None recommended by PT    Recommendations for Other Services       Precautions / Restrictions Precautions Precautions: Fall Restrictions Weight Bearing Restrictions: No    Mobility  Bed Mobility Overal bed mobility: Needs Assistance Bed Mobility: Rolling;Sidelying to Sit Rolling: Supervision Sidelying to sit: Min assist       General bed mobility comments: cues for hand placement, minAx1 from sidelying to sit for trunk elevation  Transfers Overall transfer level: Needs  assistance Equipment used: Rolling walker (2 wheeled) Transfers: Sit to/from Stand Sit to Stand: Min assist         General transfer comment: x2 during session  Ambulation/Gait Ambulation/Gait assistance: Min guard Gait Distance (Feet): 2 Feet Assistive device: Rolling walker (2 wheeled)       General Gait Details: crouched gait, fatigued quickly, shuffling steps to chair   Stairs             Wheelchair Mobility    Modified Rankin (Stroke Patients Only)       Balance Overall balance assessment: Needs assistance Sitting-balance support: Feet supported;Bilateral upper extremity supported Sitting balance-Leahy Scale: Poor       Standing balance-Leahy Scale: Poor                              Cognition Arousal/Alertness: Awake/alert Behavior During Therapy: WFL for tasks assessed/performed Overall Cognitive Status: Within Functional Limits for tasks assessed                                        Exercises General Exercises - Lower Extremity Ankle Circles/Pumps: AROM;Both;20 reps Long Arc Quad: AROM;Both;10 reps Straight Leg Raises: AROM;Both;10 reps    General Comments        Pertinent Vitals/Pain Pain Assessment: Faces Faces Pain Scale: Hurts even more Pain Location: back, R hip Pain Descriptors / Indicators: Sore;Aching;Grimacing Pain Intervention(s): Limited activity within patient's tolerance;Monitored during session;Repositioned    Home Living  Prior Function            PT Goals (current goals can now be found in the care plan section) Progress towards PT goals: Progressing toward goals    Frequency    Min 2X/week      PT Plan Current plan remains appropriate    Co-evaluation              AM-PAC PT "6 Clicks" Daily Activity  Outcome Measure  Difficulty turning over in bed (including adjusting bedclothes, sheets and blankets)?: A Lot Difficulty moving from lying  on back to sitting on the side of the bed? : Unable Difficulty sitting down on and standing up from a chair with arms (e.g., wheelchair, bedside commode, etc,.)?: Unable Help needed moving to and from a bed to chair (including a wheelchair)?: A Lot Help needed walking in hospital room?: Total Help needed climbing 3-5 steps with a railing? : Total 6 Click Score: 8    End of Session Equipment Utilized During Treatment: Gait belt;Oxygen Activity Tolerance: Patient limited by fatigue;Patient limited by pain Patient left: in chair;with nursing/sitter in room Nurse Communication: Mobility status PT Visit Diagnosis: Muscle weakness (generalized) (M62.81);Difficulty in walking, not elsewhere classified (R26.2)     Time: 8811-0315 PT Time Calculation (min) (ACUTE ONLY): 32 min  Charges:  $Therapeutic Exercise: 8-22 mins $Therapeutic Activity: 8-22 mins                     Lieutenant Diego PT, DPT 4:13 PM,04/18/18  [phone]

## 2018-04-18 NOTE — Progress Notes (Signed)
Inpatient Diabetes Program Recommendations  AACE/ADA: New Consensus Statement on Inpatient Glycemic Control (2019)  Target Ranges:  Prepandial:   less than 140 mg/dL      Peak postprandial:   less than 180 mg/dL (1-2 hours)      Critically ill patients:  140 - 180 mg/dL  Results for Deborah Obrien, Deborah Obrien (MRN [id number]) as of 04/18/2018 07:30  Ref. Range 04/18/2018 05:18  Glucose Latest Ref Range: 70 - 99 mg/dL 311 (H)   Results for Deborah Obrien, Deborah Obrien (MRN [id number]) as of 04/18/2018 07:30  Ref. Range 04/17/2018 07:42 04/17/2018 11:41 04/17/2018 16:47 04/17/2018 21:00  Glucose-Capillary Latest Ref Range: 70 - 99 mg/dL 273 (H) 294 (H) 238 (H) 345 (H)   Review of Glycemic Control  Outpatient Diabetes medications:70/30 22 units BID Current orders for Inpatient glycemic control:Novolog 0-15 units TID with meals, Novolog 0-5 units QHS  Inpatient Diabetes Program Recommendations: Insulin - Basal:Please consider ordering Lantus 10 units Q24H. Insulin-Meal Coverage: Please consider ordering Novolog 4 units TID with meals for meal coverage if patient eats at least 50% of meals.  Thanks, Barnie Alderman, RN, MSN, CDE Diabetes Coordinator Inpatient Diabetes Program [phone] (Team Pager from 8am to 5pm)

## 2018-04-18 NOTE — Progress Notes (Signed)
MD requesting RN to order 10Units lantus daily. 4 units novolog with each meal to be held if pt eats less than 50% of meals. Verbal order read back to MD, placed and confirmed.

## 2018-04-18 NOTE — Progress Notes (Addendum)
[place] [facility] [place] NAME: Deborah Obrien    MR#:  [id number]  DATE OF BIRTH:  December 13, 1940  SUBJECTIVE:   Right hip pain is better.  Constipated  Denies any fluttering.  Tolerating modified diet no further acute bleeding.  REVIEW OF SYSTEMS:    Review of Systems  Constitutional: Negative for chills and fever.  HENT: Negative for congestion and tinnitus.   Eyes: Negative for blurred vision and double vision.  Respiratory: Negative for cough, shortness of breath and wheezing.   Cardiovascular: Negative for chest pain, orthopnea and PND.  Gastrointestinal: Negative for abdominal pain, diarrhea, nausea and vomiting.  Genitourinary: Negative for dysuria and hematuria.  Neurological: Negative for dizziness, sensory change and focal weakness.  All other systems reviewed and are negative.   Nutrition: NPO Tolerating Diet: no Tolerating PT: Await Eval.   DRUG ALLERGIES:   Allergies  Allergen Reactions  . Cephalosporins Other (See Comments)    Reaction:  Unknown   . Macrolides And Ketolides Other (See Comments)    Reaction:  Unknown   . Meperidine Shortness Of Breath, Nausea Only and Other (See Comments)    Reaction:  Stomach pain   . Prednisone Anaphylaxis and Other (See Comments)    Reaction:  Increases pts blood sugar  Pt states that she is allergic to all steroids.    . Shellfish Allergy Anaphylaxis, Shortness Of Breath, Diarrhea, Nausea Only and Rash    Reaction:  Stomach pain   . Sulfa Antibiotics Shortness Of Breath, Diarrhea, Nausea Only and Other (See Comments)    Reaction:  Stomach pain   . Uloric [Febuxostat] Anaphylaxis    Locks pt's body up   . Aspirin Other (See Comments)    Reaction:  Stomach pain   . Celecoxib Other (See Comments)    Reaction:  GI bleed, weakness, and stomach pain.    . Cephalexin Diarrhea, Nausea Only and Other (See Comments)    Reaction:  Stomach pain   . Erythromycin Diarrhea, Nausea Only and  Other (See Comments)    Reaction:  Stomach pain   . Hydromorphone Other (See Comments)    Reaction:  Hypotension   . Iodinated Diagnostic Agents Rash and Other (See Comments)    Pt states that she is unable to have because she has chronic kidney disease.    Marland Kitchen Oxycodone Other (See Comments)    Reaction:  Stomach pain   . Atorvastatin Nausea Only and Other (See Comments)    Reaction:  Weakness   . Codeine Diarrhea, Nausea Only and Other (See Comments)    Reaction:  Stomach pain Pt tolerates morphine   . Doxycycline     Stomach pain   . Tape Other (See Comments)    Reaction:  Causes pts skin to tear  Pt states that she is able to use paper tape.    . Valdecoxib Swelling and Other (See Comments)    Pt states that her hands and feet swell.    . Iodine Rash    VITALS:  Blood pressure (!) 103/50, pulse 60, temperature 97.6 F (36.4 C), temperature source Oral, resp. rate 20, height 5\' 6"  (1.676 m), weight 124.4 kg (274 lb 3.2 oz), SpO2 94 %.  PHYSICAL EXAMINATION:   Physical Exam  GENERAL:  77 y.o.-year-old obese patient lying in bed awake and alert and in NAD.  EYES: Pupils equal, round, reactive to light. No scleral icterus. Extraocular muscles intact.  HEENT: Head atraumatic, normocephalic.  NECK:  Supple, no jugular venous distention. No thyroid enlargement, no tenderness.  LUNGS: Normal breath sounds bilaterally, no wheezing, rales, rhonchi. No use of accessory muscles of respiration.  CARDIOVASCULAR: Irregularly regular no murmurs, rubs, or gallops.  ABDOMEN: Soft, NT, ND. Bowel sounds present.  EXTREMITIES: No cyanosis, clubbing, + 1-2 edema b/l NEUROLOGIC: Cranial nerves II through XII intact, no focal motor or sensory deficits appreciated bilaterally. Globally weak.  PSYCHIATRIC: Alert awake oriented x 3.  Follows simple commands. SKIN: No obvious rash, lesion, or ulcer.   LABORATORY PANEL:   CBC Recent Labs  Lab 04/18/18 0518  WBC 5.7  HGB 9.1*  HCT 27.6*  PLT  289   ------------------------------------------------------------------------------------------------------------------  Chemistries  Recent Labs  Lab 04/18/18 0518  NA 142  K 3.5  CL 96*  CO2 34*  GLUCOSE 311*  BUN 26*  CREATININE 1.59*  CALCIUM 8.0*  MG 1.9  AST 25  ALT <5  ALKPHOS 134*  BILITOT 0.8   ------------------------------------------------------------------------------------------------------------------  Cardiac Enzymes No results for input(s): TROPONINI in the last 168 hours. ------------------------------------------------------------------------------------------------------------------  RADIOLOGY:  Dg Hip Unilat With Pelvis 2-3 Views Right  Result Date: 04/17/2018 CLINICAL DATA:  Right hip pain without known injury. EXAM: DG HIP (WITH OR WITHOUT PELVIS) 2-3V RIGHT COMPARISON:  None. FINDINGS: There is no evidence of hip fracture or dislocation. There is no evidence of arthropathy or other focal bone abnormality. IMPRESSION: Normal right hip. Electronically Signed   By: Marijo Conception, M.D.   On: 04/17/2018 15:44     ASSESSMENT AND PLAN:   77 year old female with past medical history of paroxysmal atrial fibrillation status post pacemaker, previous history of TIA, hypertension, morbid obesity, diabetes, chronic kidney disease stage III, anxiety, GERD who presented to the hospital due to abdominal pain and noted to have partial small bowel obstruction.   - .  -Acute on chronic diastolic CHF with volume overload Patient has gained 30 to 40 pounds over her dry weight. Lasix 60 mg IV every 8 hours, cardiology is recommending to continue for the next few days Weight 289-->274 pounds   -Acute on chronic respiratory failure with hypoxia and hypercapnia- due to underlying COPD, morbid obesity, Aspiration Pneumonia. -Patient extubated 04/13/18 and Doing well from the respiratory standpoint.  Patient's sputum cultures were positive for Klebsiella. cont. IV ancef  and will switch to oral antibiotics  -Atrial fibrillation with rapid ventricular response- she has now converted to normal sinus rhythm, appreciate cardiology input.   Continue amiodarone drip for now.  Holding Eliquis given melena/GI bleed.   IV Lopressor as needed for rate control. Metoprolol 25 mg p.o. twice daily is added to the regimen  -AKI Clinically doing okay. Creatinine 1.67-1.59  -Constipation soapsuds enema, MiraLAX every day Dulcolax suppository as needed  -Right hip pain will get an x-ray and pain management as needed PT assessment  -Hypoalbuminemia with third spacing-IV albumin albumin trended up to 2.4 Will add more IV albumin  -.  GI bleed- patient was noted to have some dark/melanotic stools .  Hemoglobin had dropped to 7.0 and therefore patient received 1 unit of packed red blood cells. Hg up to 9.5 today and stable and no bleeding overnight.  -A gastroenterology consult obtained and patient underwent an upper GI endoscopy which showed a gastric polyp which was bleeding and it was injected.  Bleeding has stopped.   Hemoglobin stable, continue PPI. - Patient tolerating PO , dysphagia 3 diet  -.  Acute partial small bowel obstruction- cause of patient's worsening abdominal  pain on admission but now resolved. NO acute issue  -.  Acute blood loss anemia- initially thought to be secondary to Mallory-Weiss tear, but patient has history of Hyperplastic polyps.  Status post upper GI endoscopy yesterday which showed a bleeding gastric polyp which was injected. -Hemoglobin improved posttransfusion stable at 9.5 and stable.  No other acute bleeding overnight.  -  Hypernatremia-  sodium normalized now.  -  Aspiration pneumonia- pt. Was on IV Unasyn.  Sputum cultures were positive for Klebsiella and its pansensitive.   -Completed the antibiotic course.  Antibiotics discontinued - High risk for aspiration.  Seen by speech therapy recommending dysphagia 3 diet with thin  liquids  8. Hyperkalemia - resolved  PT eval-skilled nursing facility  All the records are reviewed and case discussed with Care Management/Social Worker. Management plans discussed with the patient, family and they are in agreement.  CODE STATUS: Full code  DVT Prophylaxis: Ted's & SCD's.   TOTAL TIME TAKING CARE OF THIS PATIENT: 33 minutes.   POSSIBLE D/C unclear, DEPENDING ON CLINICAL CONDITION and course.   Nicholes Mango M.D on 04/18/2018 at 2:37 PM  Between 7am to 6pm - Pager - [phone]   After 6pm go to [email or web address] - Technical brewer [place] [facility]  Office  [phone]  CC: Primary care physician; [name], MD

## 2018-04-18 NOTE — Progress Notes (Signed)
Progress Note  Patient Name: Deborah Obrien Date of Encounter: 04/18/2018  Primary Cardiologist: Caryl Comes  Subjective   SOB about the same. Remains on supplemental oxygen. Still no BM. Documented UOP of 3.4 L for the past 24 hours. Net + 13.4 L for the admission. Weight 289-->274 pounds. Has remained in sinus rhythm, paced since 17:37 on 8/6. HGB low, though stable at 9.1. Magnesium 1.9. SCr 1.67-->1.59. Potassium 3.5. Albumin trending up to 2.4.    Inpatient Medications    Scheduled Meds: . busPIRone  10 mg Oral BID  . docusate sodium  100 mg Oral Daily  . furosemide  60 mg Intravenous Q8H  . insulin aspart  0-15 Units Subcutaneous TID WC  . insulin aspart  0-5 Units Subcutaneous QHS  . ipratropium-albuterol  3 mL Nebulization BID  . mouth rinse  15 mL Mouth Rinse BID  . metoprolol tartrate  5 mg Intravenous Q8H  . metoprolol tartrate  25 mg Oral BID  . multivitamin with minerals  1 tablet Oral Daily  . pantoprazole  40 mg Oral BID AC  . PARoxetine  20 mg Oral Daily  . protein supplement shake  11 oz Oral BID BM  . sodium chloride flush  10-40 mL Intracatheter Q12H  . traZODone  50 mg Oral QHS   Continuous Infusions: . amiodarone 30 mg/hr (04/17/18 1804)  .  ceFAZolin (ANCEF) IV Stopped (04/18/18 0619)   PRN Meds: acetaminophen **OR** acetaminophen, benzocaine-Menthol, dibucaine, morphine injection, ondansetron **OR** ondansetron (ZOFRAN) IV, phenol, polyvinyl alcohol, sodium chloride flush, traMADol, witch hazel-glycerin   Vital Signs    Vitals:   04/17/18 1627 04/17/18 2013 04/17/18 2030 04/18/18 0408  BP: 138/70 128/64  128/61  Pulse: (!) 119 (!) 58  60  Resp: 19     Temp: 97.8 F (36.6 C) 97.9 F (36.6 C)  (!) 97.5 F (36.4 C)  TempSrc: Oral Oral  Oral  SpO2: 95% 99% 98% 99%  Weight:    274 lb 3.2 oz (124.4 kg)  Height:        Intake/Output Summary (Last 24 hours) at 04/18/2018 0728 Last data filed at 04/18/2018 0100 Gross per 24 hour  Intake 406.54 ml    Output 3800 ml  Net -3393.46 ml   Filed Weights   04/16/18 0315 04/17/18 0334 04/18/18 0408  Weight: 292 lb 6.4 oz (132.6 kg) 289 lb 12.8 oz (131.5 kg) 274 lb 3.2 oz (124.4 kg)    Telemetry    Paced, 60 bpm, last episode of Afib ended at 17:37 on 8/6 - Personally Reviewed  ECG    n/a - Personally Reviewed  Physical Exam   GEN: No acute distress.   Neck: JVD difficult to assess given body habitus. Cardiac: RRR, no murmurs, rubs, or gallops.  Respiratory: Diminished breath sounds bilaterally with crackles along the bilateral bases.  GI: Soft, nontender, non-distended.   MS: 1-2+ bilateral lower extremity edema; No deformity. Neuro:  Alert and oriented x 3; Nonfocal.  Psych: Normal affect.  Labs    Chemistry Recent Labs  Lab 04/13/18 0521  04/16/18 0634 04/17/18 0524 04/18/18 0518  NA 143   < > 144 143 142  K 3.1*   < > 4.9 3.9 3.5  CL 109   < > 107 104 96*  CO2 29   < > 21* 27 34*  GLUCOSE 144*   < > 204* 273* 311*  BUN 44*   < > 33* 29* 26*  CREATININE 1.47*   < >  1.63* 1.67* 1.59*  CALCIUM 7.4*   < > 8.3* 8.2* 8.0*  PROT  --   --   --   --  6.2*  ALBUMIN 2.0*  --   --  2.3* 2.4*  AST  --   --   --   --  25  ALT  --   --   --   --  <5  ALKPHOS  --   --   --   --  134*  BILITOT  --   --   --   --  0.8  GFRNONAA 33*   < > 29* 28* 30*  GFRAA 39*   < > 34* 33* 35*  ANIONGAP 5   < > 16* 12 12   < > = values in this interval not displayed.     Hematology Recent Labs  Lab 04/15/18 0736 04/17/18 0524 04/18/18 0518  WBC 10.7 9.6 5.7  RBC 2.87* 2.79* 2.90*  HGB 9.5* 8.9* 9.1*  HCT 27.4* 26.5* 27.6*  MCV 95.7 94.9 95.3  MCH 33.2 31.9 31.3  MCHC 34.7 33.6 32.8  RDW 16.1* 16.5* 16.1*  PLT 177 262 289    Cardiac EnzymesNo results for input(s): TROPONINI in the last 168 hours. No results for input(s): TROPIPOC in the last 168 hours.   BNPNo results for input(s): BNP, PROBNP in the last 168 hours.   DDimer No results for input(s): DDIMER in the last 168  hours.   Radiology    Dg Hip Unilat With Pelvis 2-3 Views Right  Result Date: 04/17/2018 IMPRESSION: Normal right hip. Electronically Signed   By: Marijo Conception, M.D.   On: 04/17/2018 15:44    Cardiac Studies   Echo 04/10/2018: Study Conclusions  - Left ventricle: The cavity size was normal. Wall thickness was normal. Systolic function was normal. The estimated ejection fraction was in the range of 60% to 65%. - Mitral valve: Moderately thickened, mildly calcified leaflets . - Left atrium: The atrium was mildly dilated. - Right ventricle: The cavity size was mildly to moderately dilated. Wall thickness was mildly increased. - Right atrium: The atrium was mildly dilated. - Tricuspid valve: There was moderate regurgitation. - Pulmonary arteries: Systolic pressure was moderately increased, estimated to be 50 mm Hg plus central venous/right atrial pressure.  Patient Profile     77 y.o. 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  Assessment & Plan    1. PAF with RVR: -Has remained in sinus rhythm since 17:37 on 8/6 -Transition to PO medications when able s/p BM -Continue IV amiodarone for rate control until BM -Continue PO Lopressor 25 mg bid  -If her HGB remains stable, would recommend resuming DOAC within the next 24-48 hours  2. Acute respiratory failure with hypoxia and hypercapnia: -Now extubated and transferred to telemetry  -Multifactorial in the setting of underlying severe COPD,pulmonary hypertension, acute on chronic diastolic CHF,morbid obesity, and possible OHS with possible aspiration pneumonia -Consider PE evaluation as renal function improves, defer to IM -Wean oxygen as able -Needs PT  3. Acute on chronic diastolic CHF/pleural effusions: -She remains significantly volume overloaded and is up ~ 30-35 pounds -Likely exacerbated by  third spacing from her persistent anemia and hypoalbuminemia -Cannot exclude RV failure -Continue aggressive IV diuresis with Lasix 60 mg q 8 hours with KCl repletion -May need milrinone for RV support to augment diuresisif renal  function worsens  -She will need multiple more days of inpatient IV diuresis   4. Partial SBO: -No BM in 3 days, positive flatulence  -Will need to remain on IV medications until BM -Per IM -Ambulation should help  5. Acute blood loss anemia/upper GI bleed: -No further noted bloody BMs -HGB relatively stable, continue to monitor and maintain a level > 8.5 -Per IM  6. AKI: -Renal function improving -Continue to monitor closely  7. Hypoalbuminemia: -Received IV albumin on 7/31 -Now eating a soft diet, this will help significantly  -Albumin trending up  8. Disp: -She will require several more days of aggressive, inpatient IV diuresis along with PT    For questions or updates, please contact [place] Please consult [email or web address] for contact info under Cardiology/STEMI.    Signed, Christell Faith, PA-C [place] Pager: [phone] 04/18/2018, 7:28 AM

## 2018-04-18 NOTE — Progress Notes (Addendum)
Pharmacy Electrolyte Monitoring Consult:  Pharmacy consulted to assist in monitoring and replacing electrolytes and glucose management in this 77 y.o. female admitted on 04/04/2018 with Hematemesis and Abdominal Pain   Labs:  Sodium (mmol/L)  Date Value  04/18/2018 142  10/16/2014 141   Potassium (mmol/L)  Date Value  04/18/2018 3.5  10/16/2014 4.6   Magnesium (mg/dL)  Date Value  04/18/2018 1.9   Phosphorus (mg/dL)  Date Value  04/16/2018 4.3   Calcium (mg/dL)  Date Value  04/18/2018 8.0 (L)   Calcium, Total (mg/dL)  Date Value  10/16/2014 8.5   Albumin (g/dL)  Date Value  04/18/2018 2.4 (L)  10/29/2012 3.9    Assessment/Plan: Continue Lasix 60 mg IV every 8 hours.  Reviewed patient labs; have ordered replacement of KCl 40 mEq PO x 1 this AM, 20 mEq PO x 1 this afternoon. Goal potassium of ~4.0 (today 3.5). Mg = 1.9, no need for replacement. Will continue to monitor BMP + Mg for electrolyte abnormalities.  Pharmacy will continue to monitor and adjust per consult.   Paticia Stack, PharmD Pharmacy Resident  04/18/2018 8:49 AM

## 2018-04-19 ENCOUNTER — Encounter
Admission: RE | Admit: 2018-04-19 | Discharge: 2018-04-19 | Disposition: A | Discharge status: Home / Self Care | Payer: Medicare Other | Source: Ambulatory Visit | Attending: Internal Medicine | Admitting: Internal Medicine

## 2018-04-19 LAB — BASIC METABOLIC PANEL
ANION GAP: 11 (ref 5–15)
BUN: 26 mg/dL — ABNORMAL HIGH (ref 8–23)
CALCIUM: 8 mg/dL — AB (ref 8.9–10.3)
CO2: 36 mmol/L — ABNORMAL HIGH (ref 22–32)
Chloride: 93 mmol/L — ABNORMAL LOW (ref 98–111)
Creatinine, Ser: 1.6 mg/dL — ABNORMAL HIGH (ref 0.44–1.00)
GFR, EST AFRICAN AMERICAN: 35 mL/min — AB (ref >60)
GFR, EST NON AFRICAN AMERICAN: 30 mL/min — AB (ref >60)
GLUCOSE: 259 mg/dL — AB (ref 70–99)
POTASSIUM: 3.9 mmol/L (ref 3.5–5.1)
SODIUM: 140 mmol/L (ref 135–145)

## 2018-04-19 LAB — CBC
HCT: 27.3 % — ABNORMAL LOW (ref 35.0–47.0)
HEMOGLOBIN: 9.1 g/dL — AB (ref 12.0–16.0)
MCH: 31.3 pg (ref 26.0–34.0)
MCHC: 33.1 g/dL (ref 32.0–36.0)
MCV: 94.4 fL (ref 80.0–100.0)
Platelets: 318 10*3/uL (ref 150–440)
RBC: 2.9 MIL/uL — AB (ref 3.80–5.20)
RDW: 16.1 % — ABNORMAL HIGH (ref 11.5–14.5)
WBC: 5.3 10*3/uL (ref 3.6–11.0)

## 2018-04-19 LAB — HEPATIC FUNCTION PANEL
ALBUMIN: 2.4 g/dL — AB (ref 3.5–5.0)
ALT: <5 U/L (ref 0–44)
AST: 25 U/L (ref 15–41)
Alkaline Phosphatase: 110 U/L (ref 38–126)
Bilirubin, Direct: 0.2 mg/dL (ref 0.0–0.2)
Indirect Bilirubin: 0.7 mg/dL (ref 0.3–0.9)
Total Bilirubin: 0.9 mg/dL (ref 0.3–1.2)
Total Protein: 6.1 g/dL — ABNORMAL LOW (ref 6.5–8.1)

## 2018-04-19 LAB — GLUCOSE, CAPILLARY
Glucose-Capillary: 252 mg/dL — ABNORMAL HIGH (ref 70–99)
Glucose-Capillary: 349 mg/dL — ABNORMAL HIGH (ref 70–99)
Glucose-Capillary: 91 mg/dL (ref 70–99)
Glucose-Capillary: 120 mg/dL — ABNORMAL HIGH (ref 70–99)
Glucose-Capillary: 96 mg/dL (ref 70–99)

## 2018-04-19 LAB — APTT: APTT: 33 s (ref 24–36)

## 2018-04-19 LAB — HEPARIN LEVEL (UNFRACTIONATED): Heparin Unfractionated: 0.3 IU/mL (ref 0.30–0.70)

## 2018-04-19 LAB — MAGNESIUM: Magnesium: 1.7 mg/dL (ref 1.7–2.4)

## 2018-04-19 LAB — PROTIME-INR
INR: 1.14
Prothrombin Time: 14.5 seconds (ref 11.4–15.2)

## 2018-04-19 MED ORDER — INSULIN GLARGINE 100 UNIT/ML ~~LOC~~ SOLN
3.0000 [IU] | Freq: Once | SUBCUTANEOUS | Status: AC
  Administered 2018-04-19: 3 [IU] via SUBCUTANEOUS
  Filled 2018-04-19: qty 0.03

## 2018-04-19 MED ORDER — HEPARIN (PORCINE) IN NACL 100-0.45 UNIT/ML-% IJ SOLN
1300.0000 [IU]/h | INTRAMUSCULAR | Status: DC
  Administered 2018-04-19: 1250 [IU]/h via INTRAVENOUS
  Administered 2018-04-20: 1300 [IU]/h via INTRAVENOUS
  Filled 2018-04-19 (×2): qty 250

## 2018-04-19 MED ORDER — MAGNESIUM SULFATE 2 GM/50ML IV SOLN
2.0000 g | Freq: Once | INTRAVENOUS | Status: AC
  Administered 2018-04-19: 2 g via INTRAVENOUS
  Filled 2018-04-19: qty 50

## 2018-04-19 MED ORDER — INSULIN GLARGINE 100 UNIT/ML ~~LOC~~ SOLN
13.0000 [IU] | Freq: Every day | SUBCUTANEOUS | Status: DC
  Administered 2018-04-20 – 2018-04-22 (×3): 13 [IU] via SUBCUTANEOUS
  Filled 2018-04-19 (×4): qty 0.13

## 2018-04-19 MED ORDER — FLEET ENEMA 7-19 GM/118ML RE ENEM
1.0000 | ENEMA | Freq: Once | RECTAL | Status: AC
  Administered 2018-04-19: 1 via RECTAL

## 2018-04-19 MED ORDER — HEPARIN BOLUS VIA INFUSION
4000.0000 [IU] | Freq: Once | INTRAVENOUS | Status: AC
  Administered 2018-04-19: 4000 [IU] via INTRAVENOUS
  Filled 2018-04-19: qty 4000

## 2018-04-19 MED ORDER — METOPROLOL TARTRATE 50 MG PO TABS
50.0000 mg | ORAL_TABLET | Freq: Two times a day (BID) | ORAL | Status: DC
  Administered 2018-04-19 – 2018-04-20 (×3): 50 mg via ORAL
  Filled 2018-04-19 (×4): qty 1

## 2018-04-19 NOTE — Plan of Care (Signed)
  Problem: Clinical Measurements: Goal: Ability to maintain clinical measurements within normal limits will improve Outcome: Progressing   Problem: Clinical Measurements: Goal: Respiratory complications will improve Outcome: Progressing   Problem: Elimination: Goal: Will not experience complications related to bowel motility Outcome: Progressing

## 2018-04-19 NOTE — Progress Notes (Signed)
ANTICOAGULATION CONSULT NOTE - Initial Consult  Pharmacy Consult for heparin drip management  Indication: atrial fibrillation  Allergies  Allergen Reactions  . Cephalosporins Other (See Comments)    Reaction:  Unknown   . Macrolides And Ketolides Other (See Comments)    Reaction:  Unknown   . Meperidine Shortness Of Breath, Nausea Only and Other (See Comments)    Reaction:  Stomach pain   . Prednisone Anaphylaxis and Other (See Comments)    Reaction:  Increases pts blood sugar  Pt states that she is allergic to all steroids.    . Shellfish Allergy Anaphylaxis, Shortness Of Breath, Diarrhea, Nausea Only and Rash    Reaction:  Stomach pain   . Sulfa Antibiotics Shortness Of Breath, Diarrhea, Nausea Only and Other (See Comments)    Reaction:  Stomach pain   . Uloric [Febuxostat] Anaphylaxis    Locks pt's body up   . Aspirin Other (See Comments)    Reaction:  Stomach pain   . Celecoxib Other (See Comments)    Reaction:  GI bleed, weakness, and stomach pain.    . Cephalexin Diarrhea, Nausea Only and Other (See Comments)    Reaction:  Stomach pain   . Erythromycin Diarrhea, Nausea Only and Other (See Comments)    Reaction:  Stomach pain   . Hydromorphone Other (See Comments)    Reaction:  Hypotension   . Iodinated Diagnostic Agents Rash and Other (See Comments)    Pt states that she is unable to have because she has chronic kidney disease.    Marland Kitchen Oxycodone Other (See Comments)    Reaction:  Stomach pain   . Atorvastatin Nausea Only and Other (See Comments)    Reaction:  Weakness   . Codeine Diarrhea, Nausea Only and Other (See Comments)    Reaction:  Stomach pain Pt tolerates morphine   . Doxycycline     Stomach pain   . Tape Other (See Comments)    Reaction:  Causes pts skin to tear  Pt states that she is able to use paper tape.    . Valdecoxib Swelling and Other (See Comments)    Pt states that her hands and feet swell.    . Iodine Rash    Patient Measurements: Height:  5\' 6"  (167.6 cm) Weight: 268 lb 3.2 oz (121.7 kg) IBW/kg (Calculated) : 59.3 Heparin Dosing Weight: 88.2 kg  Vital Signs: Temp: 97.9 F (36.6 C) (08/08 1625) Temp Source: Oral (08/08 1625) BP: 100/58 (08/08 1625) Pulse Rate: 112 (08/08 1625)  Labs: Recent Labs    04/17/18 0524 04/18/18 0518 04/19/18 0613 04/19/18 1002  HGB 8.9* 9.1*  --  9.1*  HCT 26.5* 27.6*  --  27.3*  PLT 262 289  --  318  APTT  --   --   --  33  LABPROT  --   --   --  14.5  INR  --   --   --  1.14  HEPARINUNFRC  --   --   --  <0.10*  CREATININE 1.67* 1.59* 1.60*  --     Estimated Creatinine Clearance: 39.2 mL/min (A) (by C-G formula based on SCr of 1.6 mg/dL (H)).   Medical History: Past Medical History:  Diagnosis Date  . A-fib ([place]) 2016  . Acid reflux   . Anxiety   . Bowel obstruction ([place])   . CHF (congestive heart failure) ([place]) 2017  . Chronic kidney disease (CKD) stage G3a/A1, moderately decreased glomerular filtration rate (GFR) between 45-59  mL/min/1.73 square meter and albuminuria creatinine ratio less than 30 mg/g ([facility])   . Diabetes mellitus without complication ([place])   . Dyspnea   . Fatty liver   . GI bleed   . Gout   . Hypertension   . Morbid obesity ([place])   . Osteoarthritis   . Osteoporosis   . Paroxysmal atrial fibrillation ([facility])   . Presence of permanent cardiac pacemaker   . Sinus node dysfunction ([place]) 08/24/2017  . TIA (transient ischemic attack)     Medications:  Scheduled:  . busPIRone  10 mg Oral BID  . docusate sodium  100 mg Oral Daily  . furosemide  60 mg Intravenous Q8H  . insulin aspart  0-15 Units Subcutaneous TID WC  . insulin aspart  0-5 Units Subcutaneous QHS  . insulin aspart  4 Units Subcutaneous TID WC  . [START ON 04/20/2018] insulin glargine  13 Units Subcutaneous Daily  . ipratropium-albuterol  3 mL Nebulization BID  . mouth rinse  15 mL Mouth Rinse BID  . metoprolol tartrate  50 mg Oral BID  . multivitamin with minerals  1 tablet Oral Daily   . pantoprazole  40 mg Oral BID AC  . PARoxetine  20 mg Oral Daily  . polyethylene glycol  17 g Oral Daily  . protein supplement shake  11 oz Oral BID BM  . sodium chloride flush  10-40 mL Intracatheter Q12H  . traZODone  50 mg Oral QHS   Infusions:  . albumin human 12.5 g (04/19/18 0948)  . heparin 1,250 Units/hr (04/19/18 1044)    Assessment: Patient has a history of afib controlled with apixaban; however, patient has been off eliquis since 7/24 due to GIB. Initiating heparin today per cardiology for afib. Drip was started at 1250 units per hour following a 4000 unit bolus. First level is therapeutic at 0.3 IU/mL but at the low end of the range  Goal of Therapy:  Heparin level 0.3-0.7 units/ml Monitor platelets by anticoagulation protocol: Yes   Plan:  Increase heparin drip to 1300 units/hr. A follow-up level will be added for 8 hours after rate change  Will continue to monitor.   Dallie Piles, PharmD Pharmacy Resident  04/19/2018 7:41 PM

## 2018-04-19 NOTE — Progress Notes (Signed)
[facility] NAME: Deborah Obrien    MR#:  [id number]  DATE OF BIRTH:  [birth year]  SUBJECTIVE:   Right hip pain is better.  Constipated still, had a soapsuds enema and Dulcolax suppository with some pellets of stool but no decent bowel movement denies any fluttering.  Tolerating modified diet no further acute bleeding.  REVIEW OF SYSTEMS:    Review of Systems  Constitutional: Negative for chills and fever.  HENT: Negative for congestion and tinnitus.   Eyes: Negative for blurred vision and double vision.  Respiratory: Negative for cough, shortness of breath and wheezing.   Cardiovascular: Negative for chest pain, orthopnea and PND.  Gastrointestinal: Negative for abdominal pain, diarrhea, nausea and vomiting.  Genitourinary: Negative for dysuria and hematuria.  Neurological: Negative for dizziness, sensory change and focal weakness.  All other systems reviewed and are negative.   Nutrition: NPO Tolerating Diet: no Tolerating PT: Await Eval.   DRUG ALLERGIES:   Allergies  Allergen Reactions  . Cephalosporins Other (See Comments)    Reaction:  Unknown   . Macrolides And Ketolides Other (See Comments)    Reaction:  Unknown   . Meperidine Shortness Of Breath, Nausea Only and Other (See Comments)    Reaction:  Stomach pain   . Prednisone Anaphylaxis and Other (See Comments)    Reaction:  Increases pts blood sugar  Pt states that she is allergic to all steroids.    . Shellfish Allergy Anaphylaxis, Shortness Of Breath, Diarrhea, Nausea Only and Rash    Reaction:  Stomach pain   . Sulfa Antibiotics Shortness Of Breath, Diarrhea, Nausea Only and Other (See Comments)    Reaction:  Stomach pain   . Uloric [Febuxostat] Anaphylaxis    Locks pt's body up   . Aspirin Other (See Comments)    Reaction:  Stomach pain   . Celecoxib Other (See Comments)    Reaction:  GI bleed, weakness, and stomach pain.    . Cephalexin Diarrhea,  Nausea Only and Other (See Comments)    Reaction:  Stomach pain   . Erythromycin Diarrhea, Nausea Only and Other (See Comments)    Reaction:  Stomach pain   . Hydromorphone Other (See Comments)    Reaction:  Hypotension   . Iodinated Diagnostic Agents Rash and Other (See Comments)    Pt states that she is unable to have because she has chronic kidney disease.    Marland Kitchen Oxycodone Other (See Comments)    Reaction:  Stomach pain   . Atorvastatin Nausea Only and Other (See Comments)    Reaction:  Weakness   . Codeine Diarrhea, Nausea Only and Other (See Comments)    Reaction:  Stomach pain Pt tolerates morphine   . Doxycycline     Stomach pain   . Tape Other (See Comments)    Reaction:  Causes pts skin to tear  Pt states that she is able to use paper tape.    . Valdecoxib Swelling and Other (See Comments)    Pt states that her hands and feet swell.    . Iodine Rash    VITALS:  Blood pressure (!) 109/53, pulse 83, temperature 97.8 F (36.6 C), temperature source Oral, resp. rate 18, height 5\' 6"  (1.676 m), weight 121.7 kg, SpO2 97 %.  PHYSICAL EXAMINATION:   Physical Exam  GENERAL:  77 y.o.-year-old obese patient lying in bed awake and alert and in NAD.  EYES: Pupils equal, round, reactive to light.  No scleral icterus. Extraocular muscles intact.  HEENT: Head atraumatic, normocephalic.  NECK:  Supple, no jugular venous distention. No thyroid enlargement, no tenderness.  LUNGS: Normal breath sounds bilaterally, no wheezing, rales, rhonchi. No use of accessory muscles of respiration.  CARDIOVASCULAR: Irregularly regular no murmurs, rubs, or gallops.  ABDOMEN: Soft, NT, ND. Bowel sounds present.  EXTREMITIES: No cyanosis, clubbing, + 1-2 edema b/l NEUROLOGIC: Cranial nerves II through XII intact, no focal motor or sensory deficits appreciated bilaterally. Globally weak.  PSYCHIATRIC: Alert awake oriented x 3.  Follows simple commands. SKIN: No obvious rash, lesion, or ulcer.    LABORATORY PANEL:   CBC Recent Labs  Lab 04/19/18 1002  WBC 5.3  HGB 9.1*  HCT 27.3*  PLT 318   ------------------------------------------------------------------------------------------------------------------  Chemistries  Recent Labs  Lab 04/19/18 0613  NA 140  K 3.9  CL 93*  CO2 36*  GLUCOSE 259*  BUN 26*  CREATININE 1.60*  CALCIUM 8.0*  MG 1.7  AST 25  ALT <5  ALKPHOS 110  BILITOT 0.9   ------------------------------------------------------------------------------------------------------------------  Cardiac Enzymes No results for input(s): TROPONINI in the last 168 hours. ------------------------------------------------------------------------------------------------------------------  RADIOLOGY:  No results found.   ASSESSMENT AND PLAN:   77 year old female with past medical history of paroxysmal atrial fibrillation status post pacemaker, previous history of TIA, hypertension, morbid obesity, diabetes, chronic kidney disease stage III, anxiety, GERD who presented to the hospital due to abdominal pain and noted to have partial small bowel obstruction.   - .  -Acute on chronic diastolic CHF with volume overload Patient has gained 30 to 40 pounds over her dry weight. Lasix 60 mg IV every 8 hours, cardiology is recommending to continue for the next few days Weight 289-->274 pounds   -Acute on chronic respiratory failure with hypoxia and hypercapnia- due to underlying COPD, morbid obesity, Aspiration Pneumonia. -Patient extubated 04/13/18 and Doing well from the respiratory standpoint.  Patient's sputum cultures were positive for Klebsiella. cont. IV ancef and will switch to oral antibiotics  -Atrial fibrillation with rapid ventricular response- she has now converted to normal sinus rhythm, appreciate cardiology input. Discontinue amiodarone drip  -Heparin drip started closely monitoring.  Holding Eliquis given melena/GI bleed.   IV Lopressor as needed  for rate control. Metoprolol dose increased to 50 mg twice daily   -AKI Clinically doing okay. Creatinine 1.67-1.59-1.60  -Constipation we will try Fleet enema today no success with soapsuds yesterday  MiraLAX every day Dulcolax suppository as needed  -Right hip pain will get an x-ray and pain management as needed PT assessment  -Hypoalbuminemia with third spacing-IV albumin albumin trended up to 2.4  IV albumin  -.  GI bleed- patient was noted to have some dark/melanotic stools .  Hemoglobin had dropped to 7.0 and therefore patient received 1 unit of packed red blood cells. Hg up to 9.5 --9.1 today and stable and no bleeding overnight.  -A gastroenterology consult obtained and patient underwent an upper GI endoscopy which showed a gastric polyp which was bleeding and it was injected.  Bleeding has stopped.   Hemoglobin stable, continue PPI. - Patient tolerating PO , dysphagia 3 diet  -.  Acute partial small bowel obstruction- cause of patient's worsening abdominal pain on admission but now resolved. NO acute issue  -.  Acute blood loss anemia- initially thought to be secondary to Mallory-Weiss tear, but patient has history of Hyperplastic polyps.  Status post upper GI endoscopy yesterday which showed a bleeding gastric polyp which was injected. -Hemoglobin improved  posttransfusion stable at 9.5 and stable.  No other acute bleeding overnight.  -  Hypernatremia-  sodium normalized now.  -  Aspiration pneumonia- pt. Was on IV Unasyn.  Sputum cultures were positive for Klebsiella and its pansensitive.   -Completed the antibiotic course.  Antibiotics discontinued - High risk for aspiration.  Seen by speech therapy recommending dysphagia 3 diet with thin liquids  8. Hyperkalemia - resolved  PT eval-skilled nursing facility  All the records are reviewed and case discussed with Care Management/Social Worker. Management plans discussed with the patient, family and they are in  agreement.  CODE STATUS: Full code  DVT Prophylaxis: Ted's & SCD's.   TOTAL TIME TAKING CARE OF THIS PATIENT: 33 minutes.   POSSIBLE D/C unclear, DEPENDING ON CLINICAL CONDITION and course.   Nicholes Mango M.D on 04/19/2018 at 3:43 PM  Between 7am to 6pm - Pager - [phone]   After 6pm go to [email or web address] - [employer] [facility]  Office  [phone]  CC: Primary care physician; [name], MD

## 2018-04-19 NOTE — Progress Notes (Signed)
Progress Note  Patient Name: Deborah Obrien Date of Encounter: 04/19/2018  Primary Cardiologist: Caryl Comes  Subjective   SOB slightly improved this morning. Remains on supplemental oxygen. Documented UOP of 3 L for the past 24 hours and net + 10 L for the admission. Weight 274-->268 pounds. Renal function stable at 1.6. GOt into the recliner on 8/7. Still no BM. Passing gas. No chest pain. Back in Afib with RVR with ventricular rates into the 120s bpm this morning.   Inpatient Medications    Scheduled Meds: . busPIRone  10 mg Oral BID  . docusate sodium  100 mg Oral Daily  . furosemide  60 mg Intravenous Q8H  . insulin aspart  0-15 Units Subcutaneous TID WC  . insulin aspart  0-5 Units Subcutaneous QHS  . insulin aspart  4 Units Subcutaneous TID WC  . insulin glargine  10 Units Subcutaneous Daily  . ipratropium-albuterol  3 mL Nebulization BID  . mouth rinse  15 mL Mouth Rinse BID  . metoprolol tartrate  25 mg Oral BID  . multivitamin with minerals  1 tablet Oral Daily  . pantoprazole  40 mg Oral BID AC  . PARoxetine  20 mg Oral Daily  . polyethylene glycol  17 g Oral Daily  . protein supplement shake  11 oz Oral BID BM  . sodium chloride flush  10-40 mL Intracatheter Q12H  . traZODone  50 mg Oral QHS   Continuous Infusions: . albumin human 12.5 g (04/19/18 0003)  . amiodarone 30 mg/hr (04/19/18 0816)   PRN Meds: acetaminophen **OR** acetaminophen, benzocaine-Menthol, bisacodyl, dibucaine, metoprolol tartrate, morphine injection, ondansetron **OR** ondansetron (ZOFRAN) IV, phenol, polyvinyl alcohol, sodium chloride flush, traMADol, witch hazel-glycerin   Vital Signs    Vitals:   04/18/18 1952 04/18/18 2325 04/19/18 0442 04/19/18 0831  BP:  97/67 131/71 (!) 109/53  Pulse:   (!) 104 83  Resp:    18  Temp:   97.8 F (36.6 C)   TempSrc:   Oral   SpO2: 99%  92% 97%  Weight:   121.7 kg   Height:        Intake/Output Summary (Last 24 hours) at 04/19/2018 0836 Last  data filed at 04/19/2018 0448 Gross per 24 hour  Intake 298 ml  Output 3350 ml  Net -3052 ml   Filed Weights   04/17/18 0334 04/18/18 0408 04/19/18 0442  Weight: 131.5 kg 124.4 kg 121.7 kg    Telemetry    Afib with RVR into the 120s bpm this morning, AV paced overnight - Personally Reviewed  ECG    n/a - Personally Reviewed  Physical Exam   GEN: No acute distress.   Neck: JVD difficult to assess given body habitus. Cardiac: Tachycardic, irregularly irregular, no murmurs, rubs, or gallops.  Respiratory: Diminished breath sounds bilaterally.  GI: Soft, nontender, non-distended.   MS: 1+ bilateral pitting lower extremity edema; No deformity. Neuro:  Alert and oriented x 3; Nonfocal.  Psych: Normal affect.  Labs    Chemistry Recent Labs  Lab 04/13/18 0521  04/17/18 0524 04/18/18 0518 04/19/18 0613  NA 143   < > 143 142 140  K 3.1*   < > 3.9 3.5 3.9  CL 109   < > 104 96* 93*  CO2 29   < > 27 34* 36*  GLUCOSE 144*   < > 273* 311* 259*  BUN 44*   < > 29* 26* 26*  CREATININE 1.47*   < > 1.67*  1.59* 1.60*  CALCIUM 7.4*   < > 8.2* 8.0* 8.0*  PROT  --   --   --  6.2*  --   ALBUMIN 2.0*  --  2.3* 2.4*  --   AST  --   --   --  25  --   ALT  --   --   --  <5  --   ALKPHOS  --   --   --  134*  --   BILITOT  --   --   --  0.8  --   GFRNONAA 33*   < > 28* 30* 30*  GFRAA 39*   < > 33* 35* 35*  ANIONGAP 5   < > 12 12 11    < > = values in this interval not displayed.     Hematology Recent Labs  Lab 04/15/18 0736 04/17/18 0524 04/18/18 0518  WBC 10.7 9.6 5.7  RBC 2.87* 2.79* 2.90*  HGB 9.5* 8.9* 9.1*  HCT 27.4* 26.5* 27.6*  MCV 95.7 94.9 95.3  MCH 33.2 31.9 31.3  MCHC 34.7 33.6 32.8  RDW 16.1* 16.5* 16.1*  PLT 177 262 289    Cardiac EnzymesNo results for input(s): TROPONINI in the last 168 hours. No results for input(s): TROPIPOC in the last 168 hours.   BNPNo results for input(s): BNP, PROBNP in the last 168 hours.   DDimer No results for input(s): DDIMER in  the last 168 hours.   Radiology    Dg Hip Unilat With Pelvis 2-3 Views Right  Result Date: 04/17/2018 IMPRESSION: Normal right hip. Electronically Signed   By: Marijo Conception, M.D.   On: 04/17/2018 15:44    Cardiac Studies   Echo 04/10/2018: Study Conclusions  - Left ventricle: The cavity size was normal. Wall thickness was normal. Systolic function was normal. The estimated ejection fraction was in the range of 60% to 65%. - Mitral valve: Moderately thickened, mildly calcified leaflets . - Left atrium: The atrium was mildly dilated. - Right ventricle: The cavity size was mildly to moderately dilated. Wall thickness was mildly increased. - Right atrium: The atrium was mildly dilated. - Tricuspid valve: There was moderate regurgitation. - Pulmonary arteries: Systolic pressure was moderately increased, estimated to be 50 mm Hg plus central venous/right atrial pressure.  Patient Profile     77 y.o. 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  Assessment & Plan    1. PAF with RVR: -Back in Afib with RVR this morning -Stop IV amiodarone -Increase PO Lopressor 50 mg bid -HGB has remained stable as of 8/7 -Start heparin gtt for anticoagulation, transition to [place] prior to discharge if no GI procedures are needed    2. Acute respiratory failure with hypoxia and hypercapnia: -Now extubated and transferred to telemetry  -Multifactorial in the setting of underlying severe COPD,pulmonary hypertension, acute on chronic diastolic CHF,morbid obesity, and possible OHS with possible aspiration pneumonia -Consider PE evaluation as renal function improves, defer to IM -Wean oxygen as able -Needs PT  3. Acute on chronic diastolic CHF/pleural effusions: -She remains significantly volume overloaded and is up ~ 30-35 pounds -Likely exacerbated by third spacing  from her persistent anemia and hypoalbuminemia -Cannot exclude RV failure -Continue aggressive IV diuresiswith Lasix 60 mg q 8 hours with KCl repletion -May need milrinone for RV support to augment diuresisif renal function worsens  -She will need  multiple more days of inpatient IV diuresis  4. Partial SBO: -No BM in 5 days, positive flatulence  -Will need to remain on IV medications until BM -Per IM -Ambulation should help  5. Acute blood loss anemia/upper GI bleed: -No further noted bloody BMs -HGB relatively stable, continue to monitor and maintain a level > 8.5 -Monitor on heparin as above -Per IM  6. AKI: -Renal functionstable -Continue to monitor closely  7. Hypoalbuminemia: -Received IV albumin on 7/31 -Now eating a soft diet, this will help significantly -Albumin trending up as of 8/7 -Trend  8. Disp: -She will require several more days of aggressive, inpatient IV diuresis along with PT  For questions or updates, please contact [place] Please consult [email or web address] for contact info under Cardiology/STEMI.    Signed, Christell Faith, PA-C [facility] Pager: [phone] 04/19/2018, 8:36 AM

## 2018-04-19 NOTE — Progress Notes (Signed)
Fleet enema administer as per order

## 2018-04-19 NOTE — Progress Notes (Signed)
ANTICOAGULATION CONSULT NOTE - Initial Consult  Pharmacy Consult for heparin drip management  Indication: atrial fibrillation  Allergies  Allergen Reactions  . Cephalosporins Other (See Comments)    Reaction:  Unknown   . Macrolides And Ketolides Other (See Comments)    Reaction:  Unknown   . Meperidine Shortness Of Breath, Nausea Only and Other (See Comments)    Reaction:  Stomach pain   . Prednisone Anaphylaxis and Other (See Comments)    Reaction:  Increases pts blood sugar  Pt states that she is allergic to all steroids.    . Shellfish Allergy Anaphylaxis, Shortness Of Breath, Diarrhea, Nausea Only and Rash    Reaction:  Stomach pain   . Sulfa Antibiotics Shortness Of Breath, Diarrhea, Nausea Only and Other (See Comments)    Reaction:  Stomach pain   . Uloric [Febuxostat] Anaphylaxis    Locks pt's body up   . Aspirin Other (See Comments)    Reaction:  Stomach pain   . Celecoxib Other (See Comments)    Reaction:  GI bleed, weakness, and stomach pain.    . Cephalexin Diarrhea, Nausea Only and Other (See Comments)    Reaction:  Stomach pain   . Erythromycin Diarrhea, Nausea Only and Other (See Comments)    Reaction:  Stomach pain   . Hydromorphone Other (See Comments)    Reaction:  Hypotension   . Iodinated Diagnostic Agents Rash and Other (See Comments)    Pt states that she is unable to have because she has chronic kidney disease.    Marland Kitchen Oxycodone Other (See Comments)    Reaction:  Stomach pain   . Atorvastatin Nausea Only and Other (See Comments)    Reaction:  Weakness   . Codeine Diarrhea, Nausea Only and Other (See Comments)    Reaction:  Stomach pain Pt tolerates morphine   . Doxycycline     Stomach pain   . Tape Other (See Comments)    Reaction:  Causes pts skin to tear  Pt states that she is able to use paper tape.    . Valdecoxib Swelling and Other (See Comments)    Pt states that her hands and feet swell.    . Iodine Rash    Patient Measurements: Height:  5\' 6"  (167.6 cm) Weight: 268 lb 3.2 oz (121.7 kg) IBW/kg (Calculated) : 59.3 Heparin Dosing Weight: 88.2 kg  Vital Signs: Temp: 97.8 F (36.6 C) (08/08 0442) Temp Source: Oral (08/08 0442) BP: 109/53 (08/08 0831) Pulse Rate: 83 (08/08 0831)  Labs: Recent Labs    04/17/18 0524 04/18/18 0518 04/19/18 0613  HGB 8.9* 9.1*  --   HCT 26.5* 27.6*  --   PLT 262 289  --   CREATININE 1.67* 1.59* 1.60*    Estimated Creatinine Clearance: 39.2 mL/min (A) (by C-G formula based on SCr of 1.6 mg/dL (H)).   Medical History: Past Medical History:  Diagnosis Date  . A-fib ([place]) 2016  . Acid reflux   . Anxiety   . Bowel obstruction ([place])   . CHF (congestive heart failure) ([place]) 2017  . Chronic kidney disease (CKD) stage G3a/A1, moderately decreased glomerular filtration rate (GFR) between 45-59 mL/min/1.73 square meter and albuminuria creatinine ratio less than 30 mg/g ([facility])   . Diabetes mellitus without complication ([place])   . Dyspnea   . Fatty liver   . GI bleed   . Gout   . Hypertension   . Morbid obesity ([place])   . Osteoarthritis   . Osteoporosis   .  Paroxysmal atrial fibrillation ([facility])   . Presence of permanent cardiac pacemaker   . Sinus node dysfunction ([place]) 08/24/2017  . TIA (transient ischemic attack)     Medications:  Scheduled:  . busPIRone  10 mg Oral BID  . docusate sodium  100 mg Oral Daily  . furosemide  60 mg Intravenous Q8H  . heparin  4,000 Units Intravenous Once  . insulin aspart  0-15 Units Subcutaneous TID WC  . insulin aspart  0-5 Units Subcutaneous QHS  . insulin aspart  4 Units Subcutaneous TID WC  . insulin glargine  10 Units Subcutaneous Daily  . ipratropium-albuterol  3 mL Nebulization BID  . mouth rinse  15 mL Mouth Rinse BID  . metoprolol tartrate  50 mg Oral BID  . multivitamin with minerals  1 tablet Oral Daily  . pantoprazole  40 mg Oral BID AC  . PARoxetine  20 mg Oral Daily  . polyethylene glycol  17 g Oral Daily  . protein supplement  shake  11 oz Oral BID BM  . sodium chloride flush  10-40 mL Intracatheter Q12H  . traZODone  50 mg Oral QHS   Infusions:  . albumin human 12.5 g (04/19/18 0003)  . heparin    . magnesium sulfate 1 - 4 g bolus IVPB      Assessment: Patient has a history of afib controlled with apixaban; however, patient has been off eliquis since 7/24 due to GIB. Initiating heparin today per cardiology for afib.   Goal of Therapy:  Heparin level 0.3-0.7 units/ml Monitor platelets by anticoagulation protocol: Yes   Plan:  Ordered heparin  bolus of 4000 units, followed by heparin 1250 units/hr. Ordered HL for 1800.   Will continue to monitor.   Paticia Stack, PharmD Pharmacy Resident  04/19/2018 9:40 AM

## 2018-04-19 NOTE — Progress Notes (Signed)
Inpatient Diabetes Program Recommendations  AACE/ADA: New Consensus Statement on Inpatient Glycemic Control (2019)  Target Ranges:  Prepandial:   less than 140 mg/dL      Peak postprandial:   less than 180 mg/dL (1-2 hours)      Critically ill patients:  140 - 180 mg/dL   Results for ANE, CONERLY (MRN [id number]) as of 04/19/2018 09:12  Ref. Range 04/18/2018 08:41 04/18/2018 11:32 04/18/2018 16:31 04/18/2018 20:42 04/19/2018 08:27  Glucose-Capillary Latest Ref Range: 70 - 99 mg/dL 347 (H) 317 (H) 95 188 (H) 252 (H)   Review of Glycemic Control  Outpatient Diabetes medications:70/30 22 units BID Current orders for Inpatient glycemic control:Lantus 10 units daily, Novolog 0-15units TID with meals, Novolog 0-5 units QHS, Novolog 4 units TID with meals for meal coverage  Inpatient Diabetes Program Recommendations: Insulin - Basal:Please consider increasing Lantus to13units daily.   Thanks, Barnie Alderman, RN, MSN, CDE Diabetes Coordinator Inpatient Diabetes Program [phone] (Team Pager from 8am to 5pm)

## 2018-04-19 NOTE — Progress Notes (Signed)
Pharmacy Electrolyte Monitoring Consult:  Pharmacy consulted to assist in monitoring and replacing electrolytes and glucose management in this 77 y.o. female admitted on 04/04/2018 with Hematemesis and Abdominal Pain   Labs:  Sodium (mmol/L)  Date Value  04/19/2018 140  10/16/2014 141   Potassium (mmol/L)  Date Value  04/19/2018 3.9  10/16/2014 4.6   Magnesium (mg/dL)  Date Value  04/19/2018 1.7   Phosphorus (mg/dL)  Date Value  04/16/2018 4.3   Calcium (mg/dL)  Date Value  04/19/2018 8.0 (L)   Calcium, Total (mg/dL)  Date Value  10/16/2014 8.5   Albumin (g/dL)  Date Value  04/18/2018 2.4 (L)  10/29/2012 3.9    Assessment/Plan: Continue Lasix 60 mg IV every 8 hours.  Reviewed patient labs; have ordered replacement of Mg sulfate 2g IV x 1. Goal potassium of 4.0, today 3.9. Mg = 1.7, goal = 2.0.. Will continue to monitor BMP + Mg for electrolyte abnormalities.  Pharmacy will continue to monitor and adjust per consult.   Paticia Stack, PharmD Pharmacy Resident  04/19/2018 8:54 AM

## 2018-04-20 LAB — BASIC METABOLIC PANEL
Anion gap: 14 (ref 5–15)
BUN: 24 mg/dL — ABNORMAL HIGH (ref 8–23)
CHLORIDE: 91 mmol/L — AB (ref 98–111)
CO2: 38 mmol/L — AB (ref 22–32)
Calcium: 8.1 mg/dL — ABNORMAL LOW (ref 8.9–10.3)
Creatinine, Ser: 1.59 mg/dL — ABNORMAL HIGH (ref 0.44–1.00)
GFR calc non Af Amer: 30 mL/min — ABNORMAL LOW (ref >60)
GFR, EST AFRICAN AMERICAN: 35 mL/min — AB (ref >60)
GLUCOSE: 171 mg/dL — AB (ref 70–99)
Potassium: 3.7 mmol/L (ref 3.5–5.1)
Sodium: 143 mmol/L (ref 135–145)

## 2018-04-20 LAB — COMPREHENSIVE METABOLIC PANEL
AST: 30 U/L (ref 15–41)
Albumin: 2.5 g/dL — ABNORMAL LOW (ref 3.5–5.0)
Alkaline Phosphatase: 124 U/L (ref 38–126)
Anion gap: 9 (ref 5–15)
BUN: 28 mg/dL — AB (ref 8–23)
CHLORIDE: 88 mmol/L — AB (ref 98–111)
CO2: 42 mmol/L — ABNORMAL HIGH (ref 22–32)
CREATININE: 1.64 mg/dL — AB (ref 0.44–1.00)
Calcium: 8.1 mg/dL — ABNORMAL LOW (ref 8.9–10.3)
GFR calc Af Amer: 34 mL/min — ABNORMAL LOW (ref >60)
GFR, EST NON AFRICAN AMERICAN: 29 mL/min — AB (ref >60)
GLUCOSE: 275 mg/dL — AB (ref 70–99)
Potassium: 3.8 mmol/L (ref 3.5–5.1)
Sodium: 139 mmol/L (ref 135–145)
Total Bilirubin: 1 mg/dL (ref 0.3–1.2)
Total Protein: 6.2 g/dL — ABNORMAL LOW (ref 6.5–8.1)

## 2018-04-20 LAB — CBC
HEMATOCRIT: 25.3 % — AB (ref 35.0–47.0)
HEMOGLOBIN: 8.7 g/dL — AB (ref 12.0–16.0)
MCH: 32.3 pg (ref 26.0–34.0)
MCHC: 34.3 g/dL (ref 32.0–36.0)
MCV: 94 fL (ref 80.0–100.0)
Platelets: 326 10*3/uL (ref 150–440)
RBC: 2.7 MIL/uL — ABNORMAL LOW (ref 3.80–5.20)
RDW: 15.7 % — ABNORMAL HIGH (ref 11.5–14.5)
WBC: 5.4 10*3/uL (ref 3.6–11.0)

## 2018-04-20 LAB — GLUCOSE, CAPILLARY
Glucose-Capillary: 140 mg/dL — ABNORMAL HIGH (ref 70–99)
Glucose-Capillary: 174 mg/dL — ABNORMAL HIGH (ref 70–99)
Glucose-Capillary: 226 mg/dL — ABNORMAL HIGH (ref 70–99)
Glucose-Capillary: 158 mg/dL — ABNORMAL HIGH (ref 70–99)
Glucose-Capillary: 164 mg/dL — ABNORMAL HIGH (ref 70–99)

## 2018-04-20 LAB — MAGNESIUM: Magnesium: 1.9 mg/dL (ref 1.7–2.4)

## 2018-04-20 LAB — HEPARIN LEVEL (UNFRACTIONATED): HEPARIN UNFRACTIONATED: 0.33 [IU]/mL (ref 0.30–0.70)

## 2018-04-20 MED ORDER — HYDROCODONE-ACETAMINOPHEN 5-325 MG PO TABS
1.0000 | ORAL_TABLET | Freq: Four times a day (QID) | ORAL | Status: DC | PRN
Start: 2018-04-20 — End: 2018-04-23
  Administered 2018-04-21 – 2018-04-23 (×6): 1 via ORAL
  Filled 2018-04-20 (×7): qty 1

## 2018-04-20 MED ORDER — ALBUMIN HUMAN 25 % IV SOLN
12.5000 g | Freq: Two times a day (BID) | INTRAVENOUS | Status: AC
  Administered 2018-04-20 – 2018-04-21 (×3): 12.5 g via INTRAVENOUS
  Filled 2018-04-20 (×5): qty 50

## 2018-04-20 MED ORDER — BACITRACIN-NEOMYCIN-POLYMYXIN 400-5-5000 EX OINT
TOPICAL_OINTMENT | CUTANEOUS | Status: DC | PRN
  Filled 2018-04-20 (×2): qty 1

## 2018-04-20 MED ORDER — SENNOSIDES-DOCUSATE SODIUM 8.6-50 MG PO TABS
1.0000 | ORAL_TABLET | Freq: Every morning | ORAL | Status: DC
  Administered 2018-04-21 – 2018-04-23 (×3): 1 via ORAL
  Filled 2018-04-20 (×3): qty 1

## 2018-04-20 MED ORDER — POTASSIUM CHLORIDE CRYS ER 20 MEQ PO TBCR
40.0000 meq | EXTENDED_RELEASE_TABLET | Freq: Once | ORAL | Status: AC
  Administered 2018-04-20: 40 meq via ORAL
  Filled 2018-04-20: qty 2

## 2018-04-20 MED ORDER — APIXABAN 5 MG PO TABS
5.0000 mg | ORAL_TABLET | Freq: Two times a day (BID) | ORAL | Status: DC
Start: 2018-04-20 — End: 2018-04-23
  Administered 2018-04-20 – 2018-04-23 (×7): 5 mg via ORAL
  Filled 2018-04-20 (×7): qty 1

## 2018-04-20 MED ORDER — LIDOCAINE 5 % EX PTCH
1.0000 | MEDICATED_PATCH | CUTANEOUS | Status: DC
  Administered 2018-04-20 – 2018-04-23 (×4): 1 via TRANSDERMAL
  Filled 2018-04-20 (×4): qty 1

## 2018-04-20 NOTE — Progress Notes (Signed)
Physical Therapy Treatment Patient Details Name: Deborah Obrien MRN: [id number] DOB: [birth year] Today's Date: 04/20/2018    History of Present Illness 77 year old female with past medical history of paroxysmal atrial fibrillation status post pacemaker, previous history of TIA, hypertension, morbid obesity, diabetes, chronic kidney disease stage III, anxiety, GERD who presented to the hospital due to abdominal pain and noted to have partial small bowel obstruction.  Pt was extubated 04/13/18.    PT Comments    Patient alert with family and nursing staff at bedside at start of session, patient agreeable to PT though reports current back/R hip pain is 10/10. Patient needed minAx1 for supine <> sit transfer, able to sit EOB ~14minutes with intermittent support and CGA/supervision and perform some therapeutic exercises via demonstration from PT. Sit to stand attempted with RW, minAx1 from low surface, and then attempted from elevated surface. Improved independence to CGA from elevated surface, patient able to take several steps towards head of bed demonstrating improved balance/steadiness compared to last session. Patient requested return to bed due to pain levels and stated she had been in the chair all morning. The patient would benefit from further skilled PT to continue to progress towards goals and PLOF.     Follow Up Recommendations  SNF     Equipment Recommendations  None recommended by PT    Recommendations for Other Services       Precautions / Restrictions Precautions Precautions: Fall Precaution Comments: HR Restrictions Weight Bearing Restrictions: No    Mobility  Bed Mobility Overal bed mobility: Needs Assistance Bed Mobility: Supine to Sit;Rolling Rolling: Supervision Sidelying to sit: Min assist          Transfers Overall transfer level: Needs assistance Equipment used: Rolling walker (2 wheeled) Transfers: Sit to/from Stand Sit to Stand: Min guard;Min  assist         General transfer comment: x2 during session. 2nd attempt from elevated surface with CGA  Ambulation/Gait Ambulation/Gait assistance: Min guard Gait Distance (Feet): 2 Feet Assistive device: Rolling walker (2 wheeled)       General Gait Details: fatigued quickly, improved balance compared to last session   Stairs             Wheelchair Mobility    Modified Rankin (Stroke Patients Only)       Balance Overall balance assessment: Needs assistance Sitting-balance support: Feet supported;Bilateral upper extremity supported Sitting balance-Leahy Scale: Poor       Standing balance-Leahy Scale: Poor                              Cognition Arousal/Alertness: Awake/alert Behavior During Therapy: WFL for tasks assessed/performed Overall Cognitive Status: Within Functional Limits for tasks assessed                                        Exercises General Exercises - Lower Extremity Long Arc Quad: AROM;Both;20 reps Hip Flexion/Marching: AROM;Both;20 reps Heel Raises: AROM;Both;20 reps    General Comments        Pertinent Vitals/Pain Pain Assessment: 0-10 Pain Score: 10-Worst pain ever Pain Location: back, R hip Pain Descriptors / Indicators: Sore;Aching;Grimacing Pain Intervention(s): Limited activity within patient's tolerance;Repositioned;Monitored during session;Premedicated before session    Home Living                      Prior  Function            PT Goals (current goals can now be found in the care plan section) Progress towards PT goals: Progressing toward goals    Frequency    Min 2X/week      PT Plan Current plan remains appropriate    Co-evaluation              AM-PAC PT "6 Clicks" Daily Activity  Outcome Measure  Difficulty turning over in bed (including adjusting bedclothes, sheets and blankets)?: A Lot Difficulty moving from lying on back to sitting on the side of the  bed? : Unable Difficulty sitting down on and standing up from a chair with arms (e.g., wheelchair, bedside commode, etc,.)?: Unable Help needed moving to and from a bed to chair (including a wheelchair)?: A Lot Help needed walking in hospital room?: A Lot Help needed climbing 3-5 steps with a railing? : Total 6 Click Score: 9    End of Session Equipment Utilized During Treatment: Gait belt;Oxygen Activity Tolerance: Patient limited by fatigue;Patient limited by pain Patient left: in bed;with call bell/phone within reach;with bed alarm set Nurse Communication: Mobility status PT Visit Diagnosis: Muscle weakness (generalized) (M62.81);Difficulty in walking, not elsewhere classified (R26.2)     Time: 1315-1340 PT Time Calculation (min) (ACUTE ONLY): 25 min  Charges:  $Therapeutic Exercise: 8-22 mins $Therapeutic Activity: 8-22 mins                     Lieutenant Diego PT, DPT 1:47 PM,04/20/18 [phone]

## 2018-04-20 NOTE — Progress Notes (Signed)
Pharmacy Electrolyte Monitoring Consult:  Pharmacy consulted to assist in monitoring and replacing electrolytes and glucose management in this 77 y.o. female admitted on 04/04/2018 with Hematemesis and Abdominal Pain   Labs:  Sodium (mmol/L)  Date Value  04/20/2018 143  10/16/2014 141   Potassium (mmol/L)  Date Value  04/20/2018 3.7  10/16/2014 4.6   Magnesium (mg/dL)  Date Value  04/20/2018 1.9   Phosphorus (mg/dL)  Date Value  04/16/2018 4.3   Calcium (mg/dL)  Date Value  04/20/2018 8.1 (L)   Calcium, Total (mg/dL)  Date Value  10/16/2014 8.5   Albumin (g/dL)  Date Value  04/19/2018 2.4 (L)  10/29/2012 3.9    Assessment/Plan: Continue Lasix 60 mg IV every 8 hours.  Reviewed patient labs; have ordered KCL 40 mEq PO x 1. Goal potassium of 4.0, today 3.7. Mg = 1.9, goal = 2.0.. Will continue to monitor BMP + Mg for electrolyte abnormalities.  Pharmacy will continue to monitor and adjust per consult.   Paticia Stack, PharmD Pharmacy Resident  04/20/2018 7:46 AM

## 2018-04-20 NOTE — Progress Notes (Signed)
ANTICOAGULATION CONSULT NOTE - Initial Consult  Pharmacy Consult for heparin drip management  Indication: atrial fibrillation  Allergies  Allergen Reactions  . Cephalosporins Other (See Comments)    Reaction:  Unknown   . Macrolides And Ketolides Other (See Comments)    Reaction:  Unknown   . Meperidine Shortness Of Breath, Nausea Only and Other (See Comments)    Reaction:  Stomach pain   . Prednisone Anaphylaxis and Other (See Comments)    Reaction:  Increases pts blood sugar  Pt states that she is allergic to all steroids.    . Shellfish Allergy Anaphylaxis, Shortness Of Breath, Diarrhea, Nausea Only and Rash    Reaction:  Stomach pain   . Sulfa Antibiotics Shortness Of Breath, Diarrhea, Nausea Only and Other (See Comments)    Reaction:  Stomach pain   . Uloric [Febuxostat] Anaphylaxis    Locks pt's body up   . Aspirin Other (See Comments)    Reaction:  Stomach pain   . Celecoxib Other (See Comments)    Reaction:  GI bleed, weakness, and stomach pain.    . Cephalexin Diarrhea, Nausea Only and Other (See Comments)    Reaction:  Stomach pain   . Erythromycin Diarrhea, Nausea Only and Other (See Comments)    Reaction:  Stomach pain   . Hydromorphone Other (See Comments)    Reaction:  Hypotension   . Iodinated Diagnostic Agents Rash and Other (See Comments)    Pt states that she is unable to have because she has chronic kidney disease.    Marland Kitchen Oxycodone Other (See Comments)    Reaction:  Stomach pain   . Atorvastatin Nausea Only and Other (See Comments)    Reaction:  Weakness   . Codeine Diarrhea, Nausea Only and Other (See Comments)    Reaction:  Stomach pain Pt tolerates morphine   . Doxycycline     Stomach pain   . Tape Other (See Comments)    Reaction:  Causes pts skin to tear  Pt states that she is able to use paper tape.    . Valdecoxib Swelling and Other (See Comments)    Pt states that her hands and feet swell.    . Iodine Rash    Patient Measurements: Height:  5\' 6"  (167.6 cm) Weight: 261 lb 4.8 oz (118.5 kg) IBW/kg (Calculated) : 59.3 Heparin Dosing Weight: 88.2 kg  Vital Signs: Temp: 97.7 F (36.5 C) (08/09 0431) Temp Source: Oral (08/09 0431) BP: 117/41 (08/09 0431) Pulse Rate: 62 (08/09 0431)  Labs: Recent Labs    04/18/18 0518 04/19/18 0613 04/19/18 1002 04/19/18 1911 04/20/18 0708  HGB 9.1*  --  9.1*  --  8.7*  HCT 27.6*  --  27.3*  --  25.3*  PLT 289  --  318  --  326  APTT  --   --  33  --   --   LABPROT  --   --  14.5  --   --   INR  --   --  1.14  --   --   HEPARINUNFRC  --   --  <0.10* 0.30 0.33  CREATININE 1.59* 1.60*  --   --  1.59*    Estimated Creatinine Clearance: 38.8 mL/min (A) (by C-G formula based on SCr of 1.59 mg/dL (H)).   Medical History: Past Medical History:  Diagnosis Date  . A-fib ([place]) 2016  . Acid reflux   . Anxiety   . Bowel obstruction ([place])   .  CHF (congestive heart failure) ([place]) 2017  . Chronic kidney disease (CKD) stage G3a/A1, moderately decreased glomerular filtration rate (GFR) between 45-59 mL/min/1.73 square meter and albuminuria creatinine ratio less than 30 mg/g (HCC)   . Diabetes mellitus without complication ([place])   . Dyspnea   . Fatty liver   . GI bleed   . Gout   . Hypertension   . Morbid obesity ([place])   . Osteoarthritis   . Osteoporosis   . Paroxysmal atrial fibrillation (HCC)   . Presence of permanent cardiac pacemaker   . Sinus node dysfunction ([place]) 08/24/2017  . TIA (transient ischemic attack)     Medications:  Scheduled:  . busPIRone  10 mg Oral BID  . docusate sodium  100 mg Oral Daily  . furosemide  60 mg Intravenous Q8H  . insulin aspart  0-15 Units Subcutaneous TID WC  . insulin aspart  0-5 Units Subcutaneous QHS  . insulin aspart  4 Units Subcutaneous TID WC  . insulin glargine  13 Units Subcutaneous Daily  . ipratropium-albuterol  3 mL Nebulization BID  . mouth rinse  15 mL Mouth Rinse BID  . metoprolol tartrate  50 mg Oral BID  . multivitamin  with minerals  1 tablet Oral Daily  . pantoprazole  40 mg Oral BID AC  . PARoxetine  20 mg Oral Daily  . polyethylene glycol  17 g Oral Daily  . protein supplement shake  11 oz Oral BID BM  . sodium chloride flush  10-40 mL Intracatheter Q12H  . traZODone  50 mg Oral QHS   Infusions:  . heparin 1,300 Units/hr (04/20/18 0449)    Assessment: Patient has a history of afib controlled with apixaban; however, patient has been off eliquis since 7/24 due to GIB. Initiating heparin today per cardiology for afib. Drip was started at 1250 units per hour following a 4000 unit bolus. First level is therapeutic at 0.3 IU/mL but at the low end of the range. Increased drip rate to 1300 units/hr.  Goal of Therapy:  Heparin level 0.3-0.7 units/ml Monitor platelets by anticoagulation protocol: Yes   Plan:  HL = 0.33 @ 0708. Continue heparin drip at 1300 units/hr. A follow-up level will be added for 8 hours after last HL drawn.  Will continue to monitor.   Paticia Stack, PharmD Pharmacy Resident  04/20/2018 7:37 AM

## 2018-04-20 NOTE — Progress Notes (Signed)
[facility] NAME: Shi Grose    MR#:  [id number]  DATE OF BIRTH:  08-Apr-1941  SUBJECTIVE:   Right hip pain is better.  Complaining of low back pain which is chronic Had a bowel movement  Tolerating modified diet no further acute bleeding on heparin drip  REVIEW OF SYSTEMS:    Review of Systems  Constitutional: Negative for chills and fever.  HENT: Negative for congestion and tinnitus.   Eyes: Negative for blurred vision and double vision.  Respiratory: Negative for cough, shortness of breath and wheezing.   Cardiovascular: Negative for chest pain, orthopnea and PND.  Gastrointestinal: Negative for abdominal pain, diarrhea, nausea and vomiting.  Genitourinary: Negative for dysuria and hematuria.  Neurological: Negative for dizziness, sensory change and focal weakness.  All other systems reviewed and are negative.   Nutrition: NPO Tolerating Diet: no Tolerating PT: Await Eval.   DRUG ALLERGIES:   Allergies  Allergen Reactions  . Cephalosporins Other (See Comments)    Reaction:  Unknown   . Macrolides And Ketolides Other (See Comments)    Reaction:  Unknown   . Meperidine Shortness Of Breath, Nausea Only and Other (See Comments)    Reaction:  Stomach pain   . Prednisone Anaphylaxis and Other (See Comments)    Reaction:  Increases pts blood sugar  Pt states that she is allergic to all steroids.    . Shellfish Allergy Anaphylaxis, Shortness Of Breath, Diarrhea, Nausea Only and Rash    Reaction:  Stomach pain   . Sulfa Antibiotics Shortness Of Breath, Diarrhea, Nausea Only and Other (See Comments)    Reaction:  Stomach pain   . Uloric [Febuxostat] Anaphylaxis    Locks pt's body up   . Aspirin Other (See Comments)    Reaction:  Stomach pain   . Celecoxib Other (See Comments)    Reaction:  GI bleed, weakness, and stomach pain.    . Cephalexin Diarrhea, Nausea Only and Other (See Comments)    Reaction:  Stomach pain    . Erythromycin Diarrhea, Nausea Only and Other (See Comments)    Reaction:  Stomach pain   . Hydromorphone Other (See Comments)    Reaction:  Hypotension   . Iodinated Diagnostic Agents Rash and Other (See Comments)    Pt states that she is unable to have because she has chronic kidney disease.    Marland Kitchen Oxycodone Other (See Comments)    Reaction:  Stomach pain   . Atorvastatin Nausea Only and Other (See Comments)    Reaction:  Weakness   . Codeine Diarrhea, Nausea Only and Other (See Comments)    Reaction:  Stomach pain Pt tolerates morphine   . Doxycycline     Stomach pain   . Tape Other (See Comments)    Reaction:  Causes pts skin to tear  Pt states that she is able to use paper tape.    . Valdecoxib Swelling and Other (See Comments)    Pt states that her hands and feet swell.    . Iodine Rash    VITALS:  Blood pressure 101/62, pulse 60, temperature 97.7 F (36.5 C), temperature source Oral, resp. rate 18, height 5\' 6"  (1.676 m), weight 118.5 kg, SpO2 97 %.  PHYSICAL EXAMINATION:   Physical Exam  GENERAL:  77 y.o.-year-old obese patient lying in bed awake and alert and in NAD.  EYES: Pupils equal, round, reactive to light. No scleral icterus. Extraocular muscles intact.  HEENT:  Head atraumatic, normocephalic.  NECK:  Supple, no jugular venous distention. No thyroid enlargement, no tenderness.  LUNGS: Normal breath sounds bilaterally, no wheezing, rales, rhonchi. No use of accessory muscles of respiration.  CARDIOVASCULAR: Irregularly regular no murmurs, rubs, or gallops.  ABDOMEN: Soft, NT, ND. Bowel sounds present.  EXTREMITIES: No cyanosis, clubbing, + 1-2 edema b/l NEUROLOGIC: Cranial nerves II through XII intact, no focal motor or sensory deficits appreciated bilaterally. Globally weak.  PSYCHIATRIC: Alert awake oriented x 3.  Follows simple commands. SKIN: No obvious rash, lesion, or ulcer.   LABORATORY PANEL:   CBC Recent Labs  Lab 04/20/18 0708  WBC 5.4  HGB  8.7*  HCT 25.3*  PLT 326   ------------------------------------------------------------------------------------------------------------------  Chemistries  Recent Labs  Lab 04/19/18 0613 04/20/18 0708  NA 140 143  K 3.9 3.7  CL 93* 91*  CO2 36* 38*  GLUCOSE 259* 171*  BUN 26* 24*  CREATININE 1.60* 1.59*  CALCIUM 8.0* 8.1*  MG 1.7 1.9  AST 25  --   ALT <5  --   ALKPHOS 110  --   BILITOT 0.9  --    ------------------------------------------------------------------------------------------------------------------  Cardiac Enzymes No results for input(s): TROPONINI in the last 168 hours. ------------------------------------------------------------------------------------------------------------------  RADIOLOGY:  No results found.   ASSESSMENT AND PLAN:   77 year old female with past medical history of paroxysmal atrial fibrillation status post pacemaker, previous history of TIA, hypertension, morbid obesity, diabetes, chronic kidney disease stage III, anxiety, GERD who presented to the hospital due to abdominal pain and noted to have partial small bowel obstruction.    -Acute on chronic diastolic CHF with volume overload Patient has gained 30 to 40 pounds over her dry weight. Lasix 60 mg IV every 8 hours, cardiology is recommending to continue for the next few days Weight 289-->274--> 260.7 pounds   -Acute on chronic respiratory failure with hypoxia and hypercapnia- due to underlying COPD, morbid obesity, Aspiration Pneumonia. -Patient extubated 04/13/18 and Doing well from the respiratory standpoint.  Patient's sputum cultures were positive for Klebsiella. cont. IV ancef and will switch to oral antibiotics  -Atrial fibrillation paroxysmal no RVR appreciate cardiology input. Discontinued amiodarone drip  -Heparin drip as tolerated by the patient well.  Hemoglobin is stable 9.1-8.7 We will discontinue heparin and start  Eliquis -closely monitor patient given melena/GI  bleed during this admission IV Lopressor as needed for rate control. Metoprolol dose increased to 50 mg twice daily Okay to discharge patient from cardiology standpoint   -AKI Clinically doing okay. Creatinine 1.67-1.59-1.60-1.59 Avoid nephrotoxins and monitor renal function closely PCP to repeat BMP in 4 to 5 days  -Constipation we will try Fleet enema today no success with soapsuds yesterday  MiraLAX every day Dulcolax suppository as needed  -Right hip pain will get an x-ray and pain management as needed PT assessment  -Hypoalbuminemia with third spacing-IV albumin albumin trended up to 2.4  IV albumin  -.  GI bleed- patient was noted to have some dark/melanotic stools .  Patient has received 1 unit of packed red blood cells.  Hg up to 9.5 --9.1 -8.7 and stable and no bleeding overnight. ,  Cardiology is recommending to keep hemoglobin greater than 8.5 -A gastroenterology consult obtained and patient underwent an upper GI endoscopy which showed a gastric polyp which was bleeding and it was injected.  Bleeding has stopped.   continue PPI. - Patient tolerating PO , dysphagia 3 diet  -.  Acute partial small bowel obstruction- cause of patient's worsening abdominal pain  on admission but now resolved. NO acute issue  -.  Acute blood loss anemia- initially thought to be secondary to Mallory-Weiss tear, but patient has history of Hyperplastic polyps.  Status post upper GI endoscopy yesterday which showed a bleeding gastric polyp which was injected. -Hemoglobin improved posttransfusion stable at 9.5 and stable.  No other acute bleeding overnight.  -  Hypernatremia-  sodium normalized now.  -  Aspiration pneumonia- pt. Was on IV Unasyn.  Sputum cultures were positive for Klebsiella and its pansensitive.   -Completed the antibiotic course.  Antibiotics discontinued - High risk for aspiration.  Seen by speech therapy recommending dysphagia 3 diet with thin liquids  8. Hyperkalemia -  resolved  PT eval-skilled nursing facility, anticipating tomorrow  All the records are reviewed and case discussed with Care Management/Social Worker. Management plans discussed with the patient, family and they are in agreement.  CODE STATUS: Full code  DVT Prophylaxis: Ted's & SCD's.   TOTAL TIME TAKING CARE OF THIS PATIENT: 33 minutes.   POSSIBLE D/C tomorrow if hemoglobin is stable, DEPENDING ON CLINICAL CONDITION and course.   Nicholes Mango M.D on 04/20/2018 at 2:00 PM  Between 7am to 6pm - Pager - [phone]   After 6pm go to [email or web address] - [employer] [facility]  Office  [phone]  CC: Primary care physician; [name], MD

## 2018-04-20 NOTE — Consult Note (Addendum)
ANTICOAGULATION CONSULT NOTE - Initial Consult  Pharmacy Consult for apixaban Indication: atrial fibrillation  Allergies  Allergen Reactions  . Cephalosporins Other (See Comments)    Reaction:  Unknown   . Macrolides And Ketolides Other (See Comments)    Reaction:  Unknown   . Meperidine Shortness Of Breath, Nausea Only and Other (See Comments)    Reaction:  Stomach pain   . Prednisone Anaphylaxis and Other (See Comments)    Reaction:  Increases pts blood sugar  Pt states that she is allergic to all steroids.    . Shellfish Allergy Anaphylaxis, Shortness Of Breath, Diarrhea, Nausea Only and Rash    Reaction:  Stomach pain   . Sulfa Antibiotics Shortness Of Breath, Diarrhea, Nausea Only and Other (See Comments)    Reaction:  Stomach pain   . Uloric [Febuxostat] Anaphylaxis    Locks pt's body up   . Aspirin Other (See Comments)    Reaction:  Stomach pain   . Celecoxib Other (See Comments)    Reaction:  GI bleed, weakness, and stomach pain.    . Cephalexin Diarrhea, Nausea Only and Other (See Comments)    Reaction:  Stomach pain   . Erythromycin Diarrhea, Nausea Only and Other (See Comments)    Reaction:  Stomach pain   . Hydromorphone Other (See Comments)    Reaction:  Hypotension   . Iodinated Diagnostic Agents Rash and Other (See Comments)    Pt states that she is unable to have because she has chronic kidney disease.    Marland Kitchen Oxycodone Other (See Comments)    Reaction:  Stomach pain   . Atorvastatin Nausea Only and Other (See Comments)    Reaction:  Weakness   . Codeine Diarrhea, Nausea Only and Other (See Comments)    Reaction:  Stomach pain Pt tolerates morphine   . Doxycycline     Stomach pain   . Tape Other (See Comments)    Reaction:  Causes pts skin to tear  Pt states that she is able to use paper tape.    . Valdecoxib Swelling and Other (See Comments)    Pt states that her hands and feet swell.    . Iodine Rash    Patient Measurements: Height: 5\' 6"  (167.6  cm) Weight: 261 lb 4.8 oz (118.5 kg) IBW/kg (Calculated) : 59.3  Vital Signs: Temp: 97.7 F (36.5 C) (08/09 0739) Temp Source: Oral (08/09 0739) BP: 101/62 (08/09 0739) Pulse Rate: 60 (08/09 0739)  Labs: Recent Labs    04/18/18 0518 04/19/18 0613 04/19/18 1002 04/19/18 1911 04/20/18 0708  HGB 9.1*  --  9.1*  --  8.7*  HCT 27.6*  --  27.3*  --  25.3*  PLT 289  --  318  --  326  APTT  --   --  33  --   --   LABPROT  --   --  14.5  --   --   INR  --   --  1.14  --   --   HEPARINUNFRC  --   --  <0.10* 0.30 0.33  CREATININE 1.59* 1.60*  --   --  1.59*    Estimated Creatinine Clearance: 38.8 mL/min (A) (by C-G formula based on SCr of 1.59 mg/dL (H)).   Medical History: Past Medical History:  Diagnosis Date  . A-fib ([place]) 2016  . Acid reflux   . Anxiety   . Bowel obstruction ([place])   . CHF (congestive heart failure) ([place]) 2017  .  Chronic kidney disease (CKD) stage G3a/A1, moderately decreased glomerular filtration rate (GFR) between 45-59 mL/min/1.73 square meter and albuminuria creatinine ratio less than 30 mg/g (HCC)   . Diabetes mellitus without complication ([place])   . Dyspnea   . Fatty liver   . GI bleed   . Gout   . Hypertension   . Morbid obesity ([place])   . Osteoarthritis   . Osteoporosis   . Paroxysmal atrial fibrillation (HCC)   . Presence of permanent cardiac pacemaker   . Sinus node dysfunction ([place]) 08/24/2017  . TIA (transient ischemic attack)     Medications:  Medications Prior to Admission  Medication Sig Dispense Refill Last Dose  . acetaminophen (TYLENOL) 500 MG tablet Take 1,000 mg by mouth every 6 (six) hours as needed for mild pain or moderate pain.    prn at prn  . allopurinol (ZYLOPRIM) 100 MG tablet Take 150 mg by mouth daily.    04/04/2018 at 0830  . amiodarone (PACERONE) 400 MG tablet Take 1/2 tablet (200 mg) by mouth once daily as directed 30 tablet 6 04/04/2018 at 0830  . apixaban (ELIQUIS) 5 MG TABS tablet Take 1 tablet (5 mg total) by  mouth 2 (two) times daily. 180 tablet 3 04/04/2018 at 0830  . busPIRone (BUSPAR) 10 MG tablet Take 10 mg by mouth 2 (two) times daily.   04/04/2018 at 0830  . cholecalciferol (VITAMIN D) 1000 units tablet Take 1,000 Units by mouth daily.   04/04/2018 at 0830  . colchicine 0.6 MG tablet Take 0.6 mg by mouth daily as needed (gout).    prn at prn  . docusate sodium (COLACE) 100 MG capsule Take 100 mg by mouth at bedtime.    04/04/2018 at Unknown time  . insulin NPH-regular Human (NOVOLIN 70/30) (70-30) 100 UNIT/ML injection Inject 22 Units into the skin 2 (two) times daily before a meal.    04/04/2018 at 0830  . ketoconazole (NIZORAL) 2 % cream Apply 1 application topically every 2 (two) hours as needed (for rash under breasts).    prn at prn  . oxybutynin (DITROPAN) 5 MG tablet Take 0.5 tablets (2.5 mg total) by mouth at bedtime. (Patient taking differently: Take 10 mg by mouth 2 (two) times daily. ) 30 tablet 0 04/04/2018 at 0830  . pantoprazole (PROTONIX) 40 MG tablet Take 1 tablet (40 mg total) by mouth 2 (two) times daily. 60 tablet 0 04/04/2018 at 0830  . PARoxetine (PAXIL) 20 MG tablet Take 20 mg by mouth daily.   04/04/2018 at 0830  . Polyethyl Glycol-Propyl Glycol (SYSTANE OP) Apply 1 drop to eye 2 (two) times daily as needed (dry eyes).    PRN at PRN  . pravastatin (PRAVACHOL) 20 MG tablet Take 20 mg by mouth at bedtime.    04/03/2018 at Unknown time  . talc (ZEASORB) powder Apply 1 application topically as needed (under the breast irritation).   prn at prn  . traZODone (DESYREL) 50 MG tablet Take 1 tablet (50 mg total) by mouth at bedtime. 30 tablet 0 04/03/2018 at Unknown time  . furosemide (LASIX) 20 MG tablet Take 1 tablet (20 mg total) by mouth daily. (Patient not taking: Reported on 04/04/2018) 30 tablet 0 Not Taking at Unknown time  . HYDROcodone-acetaminophen (NORCO/VICODIN) 5-325 MG tablet Take 1 tablet by mouth every 6 (six) hours as needed for moderate pain. (Patient not taking: Reported on  04/04/2018) 8 tablet 0 Not Taking at Unknown time  . hydrocortisone 2.5 % cream Apply 1  application topically daily as needed (rash).   Not Taking at Unknown time    Assessment: 77 yo female admitted on 7/24 for GIB, apixaban was held since then. Heparin bolus + drip was started on 8/8, but has since been discontinued to switch to apixaban.  Plan:  Initiated Apixaban 5 mg BID to start when heparin drip discontinued (informed nurse).  Will continue to monitor.  Paticia Stack, PharmD Pharmacy Resident  04/20/2018 12:10 PM

## 2018-04-20 NOTE — Progress Notes (Signed)
Talked to Dr. Vianne Bulls about patient BP 96/37, has scheduled lasix 60 mg and metoprolol 50 mg, order to hold doses. No other concern at the moment. RN will continue to monitor.

## 2018-04-20 NOTE — Care Management Important Message (Signed)
Copy of signed IM left with patient and husband in room. 

## 2018-04-20 NOTE — Progress Notes (Signed)
Progress Note  Patient Name: Deborah Obrien Date of Encounter: 04/20/2018  Primary Cardiologist: Caryl Comes  Subjective   Patient had first bowel movement overnight.   Remains on supplemental oxygen. No current dyspnea, SOB. No chest tightness or pain, palpitations, racing HR. No syncope or feelings of near syncope. Patient and husband are eager for discharge and to move toward [facility] now that patient has had a bowel movement. Discussed need for further diuresis, ambulation, as well as DOAC pending any GI procedures and with consideration of recent Hgb drop overnight to 8.7. Husband and patient expressed understanding.   Patient with episode of severe back and hip pain overnight and very little sleep last night d/t pain. Reports she feels better this morning. Patient and husband eager for further physical therapy and requested clarification regarding PT schedule. Discussed with IM.   Patient back in Afib today with rates 60-100bpm after maintaining sinus rhythm 8/6-8/8. She does not report any cardiac symptoms now that she is back in Afib.   She is still fluid overloaded with documented UOP of 4.3 L for the past 24 hours.  Weight 289-->274--> 260.7 pounds.  HGB with slight decrease from yesterday 9.1--> 8.7 as above - will need monitoring considering patient history of GI bleed and in light of patient need for DOAC d/t Afib.  Pharmacy consulted by IM for electrolyte management.  Magnesium 1.7 -->1.9. SCr 1.67-->1.59.  Potassium 3.5, replete with goal 4.0. Albumin trending up to 2.4.    Inpatient Medications    Scheduled Meds: . busPIRone  10 mg Oral BID  . docusate sodium  100 mg Oral Daily  . furosemide  60 mg Intravenous Q8H  . insulin aspart  0-15 Units Subcutaneous TID WC  . insulin aspart  0-5 Units Subcutaneous QHS  . insulin aspart  4 Units Subcutaneous TID WC  . insulin glargine  13 Units Subcutaneous Daily  . ipratropium-albuterol  3 mL Nebulization BID  . mouth rinse   15 mL Mouth Rinse BID  . metoprolol tartrate  50 mg Oral BID  . multivitamin with minerals  1 tablet Oral Daily  . pantoprazole  40 mg Oral BID AC  . PARoxetine  20 mg Oral Daily  . polyethylene glycol  17 g Oral Daily  . protein supplement shake  11 oz Oral BID BM  . sodium chloride flush  10-40 mL Intracatheter Q12H  . traZODone  50 mg Oral QHS   Continuous Infusions: . heparin 1,300 Units/hr (04/20/18 0449)   PRN Meds: acetaminophen **OR** acetaminophen, benzocaine-Menthol, bisacodyl, dibucaine, metoprolol tartrate, morphine injection, neomycin-bacitracin-polymyxin, ondansetron **OR** ondansetron (ZOFRAN) IV, phenol, polyvinyl alcohol, sodium chloride flush, witch hazel-glycerin   Vital Signs    Vitals:   04/19/18 2101 04/20/18 0431 04/20/18 0739 04/20/18 0742  BP:  (!) 117/41 101/62   Pulse:  62 60   Resp:  17 18   Temp:  97.7 F (36.5 C) 97.7 F (36.5 C)   TempSrc:  Oral Oral   SpO2: 98% 96% 97% 97%  Weight:  118.5 kg    Height:        Intake/Output Summary (Last 24 hours) at 04/20/2018 0936 Last data filed at 04/20/2018 0400 Gross per 24 hour  Intake 840 ml  Output 4350 ml  Net -3510 ml   Filed Weights   04/18/18 0408 04/19/18 0442 04/20/18 0431  Weight: 124.4 kg 121.7 kg 118.5 kg    Telemetry    Afib, HR 60-100- Personally Reviewed  ECG  n/a - Personally Reviewed  Physical Exam   GEN: No acute distress.  Neck: JVD difficult to assess given body habitus. Cardiac: IRIR, no murmurs, rubs, or gallops.  Respiratory: Diminished breath sounds bilaterally with crackles along the bilateral bases.  GI: Soft, nontender, non-distended.   MS: 2+ bilateral lower extremity edema; No deformity. Neuro:  Alert and oriented x 3; Nonfocal.  Psych: Normal affect.  Labs    Chemistry Recent Labs  Lab 04/17/18 0524 04/18/18 0518 04/19/18 0613 04/20/18 0708  NA 143 142 140 143  K 3.9 3.5 3.9 3.7  CL 104 96* 93* 91*  CO2 27 34* 36* 38*  GLUCOSE 273* 311* 259*  171*  BUN 29* 26* 26* 24*  CREATININE 1.67* 1.59* 1.60* 1.59*  CALCIUM 8.2* 8.0* 8.0* 8.1*  PROT  --  6.2* 6.1*  --   ALBUMIN 2.3* 2.4* 2.4*  --   AST  --  25 25  --   ALT  --  <5 <5  --   ALKPHOS  --  134* 110  --   BILITOT  --  0.8 0.9  --   GFRNONAA 28* 30* 30* 30*  GFRAA 33* 35* 35* 35*  ANIONGAP 12 12 11 14      Hematology Recent Labs  Lab 04/18/18 0518 04/19/18 1002 04/20/18 0708  WBC 5.7 5.3 5.4  RBC 2.90* 2.90* 2.70*  HGB 9.1* 9.1* 8.7*  HCT 27.6* 27.3* 25.3*  MCV 95.3 94.4 94.0  MCH 31.3 31.3 32.3  MCHC 32.8 33.1 34.3  RDW 16.1* 16.1* 15.7*  PLT 289 318 326    Cardiac EnzymesNo results for input(s): TROPONINI in the last 168 hours. No results for input(s): TROPIPOC in the last 168 hours.   BNPNo results for input(s): BNP, PROBNP in the last 168 hours.   DDimer No results for input(s): DDIMER in the last 168 hours.   Radiology    Dg Hip Unilat With Pelvis 2-3 Views Right  Result Date: 04/17/2018 IMPRESSION: Normal right hip. Electronically Signed   By: Marijo Conception, M.D.   On: 04/17/2018 15:44    Cardiac Studies   Echo 04/10/2018: Study Conclusions  - Left ventricle: The cavity size was normal. Wall thickness was normal. Systolic function was normal. The estimated ejection fraction was in the range of 60% to 65%. - Mitral valve: Moderately thickened, mildly calcified leaflets . - Left atrium: The atrium was mildly dilated. - Right ventricle: The cavity size was mildly to moderately dilated. Wall thickness was mildly increased. - Right atrium: The atrium was mildly dilated. - Tricuspid valve: There was moderate regurgitation. - Pulmonary arteries: Systolic pressure was moderately increased, estimated to be 50 mm Hg plus central venous/right atrial pressure.  Patient Profile     77 y.o. 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  Assessment & Plan    1. PAF with RVR: -Back in Afib with rates 60-100. Previously paced and in sinus rhythm from 8/6-8/8 -Plan to transition to PO medications as BM x1 (8/8-8/9)  -Patient will need anticoagulation with DOAC by discharge.  -Consider Hgb 9.1  8.7 with h/o GI bleed when starting anticoagulation as will need to maintain above Hgb 8.5. Do not start Eliquis if need for further GI procedures.  -Continue PO Lopressor 50 mg bid  -Heparin gtt - per pharmacy -Electrolytes as above - replacement today as per pharmacy documentation  2. Acute respiratory failure with hypoxia and hypercapnia: -Extubated and transferred to telemetry  -Multifactorial in the setting of underlying severe COPD,pulmonary hypertension, acute on chronic diastolic CHF,morbid obesity, and possible OHS with possible aspiration pneumonia -Consider PE evaluation as renal function improves, defer to IM -Wean oxygen as able. Pt still on [place] today.  3. Acute on chronic diastolic CHF/pleural effusions: -She remains significantly volume overloaded and reports still above dry weight -Likely exacerbated by third spacing from her persistent anemia and hypoalbuminemia -Cannot exclude RV failure -Continue aggressive IV diuresis with Lasix 60 mg q 8 hours & KCl repletion -K 3.9  3.7 8/9. Pharmacy managing per today's note. Goal 4.0 -Renal function improving with Cr 1.59. Consider milrinone for RV support to augment diuresisif renal function worsens. -Will need multiple more days of inpatient IV diuresis   4. Partial SBO: -After 6 days of no BM, stool x1 on 8/8-8/9  -Per IM transition from IV to po meds -Caution with restart of Eliquis and pending any needed GI procedures given drop overnight in Hgb to 8.7 -Continued ambulation and PT should help with continued BM  5. Acute blood loss anemia/upper GI bleed: -No further noted bloody BMs -HGB with recent decline as above. Continue to monitor as 8.7 &  maintain a level > 8.5 -Per IM  6. AKI: -Renal function improving -Cr 1.59  1.6  1.59  -Continue to monitor closely  7. Hypoalbuminemia: -Received IV albumin on 7/31 -Soft diet will help significantly  -Albumin trending up 2.3  2.4  8. Disp: -She will require several more days aggressive, inpatient IV diuresis along with PT   For questions or updates, please contact [place] Please consult [email or web address] for contact info under Cardiology/STEMI.    Signed, Arvil Chaco, PA-C  [facility] 04/20/2018 9:36 AM

## 2018-04-20 NOTE — Progress Notes (Signed)
Pt states that Tramadol does not help her with the pain. Discontinued Tramadol per MD's verbal order. Pt requesting to have Morphine only. Will continue to monitor.

## 2018-04-21 LAB — CBC
HCT: 26.6 % — ABNORMAL LOW (ref 35.0–47.0)
Hemoglobin: 8.9 g/dL — ABNORMAL LOW (ref 12.0–16.0)
MCH: 31 pg (ref 26.0–34.0)
MCHC: 33.3 g/dL (ref 32.0–36.0)
MCV: 93.1 fL (ref 80.0–100.0)
PLATELETS: 294 10*3/uL (ref 150–440)
RBC: 2.86 MIL/uL — AB (ref 3.80–5.20)
RDW: 15.6 % — ABNORMAL HIGH (ref 11.5–14.5)
WBC: 4.9 10*3/uL (ref 3.6–11.0)

## 2018-04-21 LAB — BASIC METABOLIC PANEL
Anion gap: 13 (ref 5–15)
BUN: 27 mg/dL — ABNORMAL HIGH (ref 8–23)
CALCIUM: 8.3 mg/dL — AB (ref 8.9–10.3)
CO2: 37 mmol/L — ABNORMAL HIGH (ref 22–32)
CREATININE: 1.65 mg/dL — AB (ref 0.44–1.00)
Chloride: 88 mmol/L — ABNORMAL LOW (ref 98–111)
GFR calc non Af Amer: 29 mL/min — ABNORMAL LOW (ref >60)
GFR, EST AFRICAN AMERICAN: 33 mL/min — AB (ref >60)
Glucose, Bld: 347 mg/dL — ABNORMAL HIGH (ref 70–99)
Potassium: 4.5 mmol/L (ref 3.5–5.1)
SODIUM: 138 mmol/L (ref 135–145)

## 2018-04-21 LAB — GLUCOSE, CAPILLARY
Glucose-Capillary: 250 mg/dL — ABNORMAL HIGH (ref 70–99)
Glucose-Capillary: 320 mg/dL — ABNORMAL HIGH (ref 70–99)
Glucose-Capillary: 302 mg/dL — ABNORMAL HIGH (ref 70–99)
Glucose-Capillary: 258 mg/dL — ABNORMAL HIGH (ref 70–99)

## 2018-04-21 LAB — MAGNESIUM: MAGNESIUM: 1.8 mg/dL (ref 1.7–2.4)

## 2018-04-21 MED ORDER — FUROSEMIDE 10 MG/ML IJ SOLN
40.0000 mg | Freq: Two times a day (BID) | INTRAMUSCULAR | Status: DC
  Administered 2018-04-21 – 2018-04-22 (×4): 40 mg via INTRAVENOUS
  Filled 2018-04-21 (×5): qty 4

## 2018-04-21 MED ORDER — MAGNESIUM SULFATE IN D5W 1-5 GM/100ML-% IV SOLN
1.0000 g | Freq: Once | INTRAVENOUS | Status: AC
  Administered 2018-04-21: 1 g via INTRAVENOUS
  Filled 2018-04-21: qty 100

## 2018-04-21 MED ORDER — METOPROLOL TARTRATE 25 MG PO TABS
12.5000 mg | ORAL_TABLET | Freq: Two times a day (BID) | ORAL | Status: DC
  Administered 2018-04-21 – 2018-04-22 (×2): 12.5 mg via ORAL
  Filled 2018-04-21 (×4): qty 1

## 2018-04-21 NOTE — Consult Note (Addendum)
Subjective:   CC: right foot blister HPI:  Deborah Obrien is a 77 y.o. female who was referred by Nicholes Mango for right foot blister.  Patient has had a prolonged hospitalization and during her stay SCDs were placed on her foot and was noted to be rubbing against the dorsal aspect.  She developed a blister that has been enlarging since in the area.  She states that the blister itself is itchy at times otherwise.  She also endorses a smaller superficial wound closer to her ankle. Past Medical History:  has a past medical history of A-fib ([place]) (2016), Acid reflux, Anxiety, Bowel obstruction ([place]), CHF (congestive heart failure) ([place]) (2017), Chronic kidney disease (CKD) stage G3a/A1, moderately decreased glomerular filtration rate (GFR) between 45-59 mL/min/1.73 square meter and albuminuria creatinine ratio less than 30 mg/g ([place]), Diabetes mellitus without complication ([place]), Dyspnea, Fatty liver, GI bleed, Gout, Hypertension, Morbid obesity ([place]), Osteoarthritis, Osteoporosis, Paroxysmal atrial fibrillation ([place]), Presence of permanent cardiac pacemaker, Sinus node dysfunction ([place]) (08/24/2017), and TIA (transient ischemic attack).  Past Surgical History:  has a past surgical history that includes Shoulder arthroscopy with subacromial decompression (Left, 2013); Esophagogastroduodenoscopy (N/A, 01/27/2015); Pacemaker insertion (Left, 07/01/2015); Esophagogastroduodenoscopy (egd) with propofol (N/A, 07/24/2015); Back surgery; Replacement total knee bilateral; Dorsal compartment release (Left, 09/14/2016); Breast biopsy (Right, 08/16); Breast excisional biopsy (Left); Appendectomy; Abdominal hysterectomy (1980); Cholecystectomy; Colon surgery (1999); colon blockage (1999); Joint replacement (2014); Cataract extraction, bilateral (2012); Colonoscopy (2016); TEE without cardioversion (N/A, 06/28/2017); CARDIOVERSION (N/A, 06/28/2017); Cardiac catheterization (Left, 08/25/2016); PACEMAKER REVISION (N/A,  08/28/2017); LEAD REVISION/REPAIR (N/A, 08/29/2017); Cardioversion (N/A, 02/06/2018); and Esophagogastroduodenoscopy (egd) with propofol (N/A, 04/12/2018).  Family History: family history includes CAD in her mother; Cancer in her father; Diabetes Mellitus II in her mother; Heart attack in her mother.  Social History:  reports that she has never smoked. She has never used smokeless tobacco. She reports that she does not drink alcohol or use drugs.  Current Medications: MAR reviewed  Allergies:  Allergies  Allergen Reactions  . Cephalosporins Other (See Comments)    Reaction:  Unknown   . Macrolides And Ketolides Other (See Comments)    Reaction:  Unknown   . Meperidine Shortness Of Breath, Nausea Only and Other (See Comments)    Reaction:  Stomach pain   . Prednisone Anaphylaxis and Other (See Comments)    Reaction:  Increases pts blood sugar  Pt states that she is allergic to all steroids.    . Shellfish Allergy Anaphylaxis, Shortness Of Breath, Diarrhea, Nausea Only and Rash    Reaction:  Stomach pain   . Sulfa Antibiotics Shortness Of Breath, Diarrhea, Nausea Only and Other (See Comments)    Reaction:  Stomach pain   . Uloric [Febuxostat] Anaphylaxis    Locks pt's body up   . Aspirin Other (See Comments)    Reaction:  Stomach pain   . Celecoxib Other (See Comments)    Reaction:  GI bleed, weakness, and stomach pain.    . Cephalexin Diarrhea, Nausea Only and Other (See Comments)    Reaction:  Stomach pain   . Erythromycin Diarrhea, Nausea Only and Other (See Comments)    Reaction:  Stomach pain   . Hydromorphone Other (See Comments)    Reaction:  Hypotension   . Iodinated Diagnostic Agents Rash and Other (See Comments)    Pt states that she is unable to have because she has chronic kidney disease.    Marland Kitchen Oxycodone Other (See Comments)    Reaction:  Stomach  pain   . Atorvastatin Nausea Only and Other (See Comments)    Reaction:  Weakness   . Codeine Diarrhea, Nausea Only and  Other (See Comments)    Reaction:  Stomach pain Pt tolerates morphine   . Doxycycline     Stomach pain   . Tape Other (See Comments)    Reaction:  Causes pts skin to tear  Pt states that she is able to use paper tape.    . Valdecoxib Swelling and Other (See Comments)    Pt states that her hands and feet swell.    . Iodine Rash    ROS:  A 15 point review of systems was performed and pertinent positives and negatives noted in HPI   Objective:     BP (!) 117/40 (BP Location: Left Arm)   Pulse 65   Temp 98.3 F (36.8 C) (Oral)   Resp 17   Ht 5' 6"  (1.676 m)   Wt 116.9 kg   SpO2 96%   BMI 41.61 kg/m   Constitutional :  alert, cooperative and appears stated age  Lymphatics/Throat:  no asymmetry, masses, or scars  Respiratory:  clear to auscultation bilaterally  Cardiovascular:  irregularly irregular rhythm  Gastrointestinal: soft, non-tender; bowel sounds normal; no masses,  no organomegaly.  Musculoskeletal: Steady gait and movement  Skin: Cool and moist.  Right foot noted to have superficial 2cm x 2cm x 59m deep ulcer on dorsal aspect, near ankle.  Closer to the base of toes is a 10cm x 8cm x 4cm tall blister with clear fluid.  No discharge currently.  Psychiatric: Normal affect, non-agitated, not confused       LABS:  n/a   RADS: n/a  Assessment:      Right foot wound and right foot blister  Plan:     Explained to patient that blister will likely pop on its own due to its size and thin outer skin layer.  Recommended that it be drained at bedside today.  The smaller more proximal wound can be covered with wet-to-dry dressing daily until healed on its own.  Patient verbalized understanding and obtained a verbal consent to proceed with drainage and debridement of right foot blister.  Preoperative diagnosis:  Right foot blister Postoperative diagnosis same Procedure debridement of right foot blister EGHW:EXHBComplications: none apparent Description of procedure:  Lateral edge of blister was punctured with sterile suture removal kit and approximately 20 mL's of clear fluid drained from the wound itself.  Overlying skin overall remained intact but excess skin  That was beyond the wound edgegently debrided using sharp excision, down to the dermal layer.  After drainage the wound itself measured 10 x 8 cm with no residual fluid noted.  Patient tolerated procedure well.  Wound was covered with dry 4 x 4 and secured in place with paper tape.  surgery will follow prn.

## 2018-04-21 NOTE — Progress Notes (Addendum)
[place] [facility] [place] NAME: Deborah Obrien    MR#:  [id number]  DATE OF BIRTH:  March 04, 1941  SUBJECTIVE:   Noticed a blister on the right foot from wearing SCDs  Tolerating modified diet no further acute bleeding on heparin drip  REVIEW OF SYSTEMS:    Review of Systems  Constitutional: Negative for chills and fever.  HENT: Negative for congestion and tinnitus.   Eyes: Negative for blurred vision and double vision.  Respiratory: Negative for cough, shortness of breath and wheezing.   Cardiovascular: Negative for chest pain, orthopnea and PND.  Gastrointestinal: Negative for abdominal pain, diarrhea, nausea and vomiting.  Genitourinary: Negative for dysuria and hematuria.  Neurological: Negative for dizziness, sensory change and focal weakness.  All other systems reviewed and are negative.   Nutrition: NPO Tolerating Diet: no Tolerating PT: Await Eval.   DRUG ALLERGIES:   Allergies  Allergen Reactions  . Cephalosporins Other (See Comments)    Reaction:  Unknown   . Macrolides And Ketolides Other (See Comments)    Reaction:  Unknown   . Meperidine Shortness Of Breath, Nausea Only and Other (See Comments)    Reaction:  Stomach pain   . Prednisone Anaphylaxis and Other (See Comments)    Reaction:  Increases pts blood sugar  Pt states that she is allergic to all steroids.    . Shellfish Allergy Anaphylaxis, Shortness Of Breath, Diarrhea, Nausea Only and Rash    Reaction:  Stomach pain   . Sulfa Antibiotics Shortness Of Breath, Diarrhea, Nausea Only and Other (See Comments)    Reaction:  Stomach pain   . Uloric [Febuxostat] Anaphylaxis    Locks pt's body up   . Aspirin Other (See Comments)    Reaction:  Stomach pain   . Celecoxib Other (See Comments)    Reaction:  GI bleed, weakness, and stomach pain.    . Cephalexin Diarrhea, Nausea Only and Other (See Comments)    Reaction:  Stomach pain   . Erythromycin Diarrhea, Nausea Only  and Other (See Comments)    Reaction:  Stomach pain   . Hydromorphone Other (See Comments)    Reaction:  Hypotension   . Iodinated Diagnostic Agents Rash and Other (See Comments)    Pt states that she is unable to have because she has chronic kidney disease.    Marland Kitchen Oxycodone Other (See Comments)    Reaction:  Stomach pain   . Atorvastatin Nausea Only and Other (See Comments)    Reaction:  Weakness   . Codeine Diarrhea, Nausea Only and Other (See Comments)    Reaction:  Stomach pain Pt tolerates morphine   . Doxycycline     Stomach pain   . Tape Other (See Comments)    Reaction:  Causes pts skin to tear  Pt states that she is able to use paper tape.    . Valdecoxib Swelling and Other (See Comments)    Pt states that her hands and feet swell.    . Iodine Rash    VITALS:  Blood pressure (!) 117/40, pulse 65, temperature 98.3 F (36.8 C), temperature source Oral, resp. rate 17, height 5\' 6"  (1.676 m), weight 116.9 kg, SpO2 96 %.  PHYSICAL EXAMINATION:   Physical Exam  GENERAL:  77 y.o.-year-old obese patient lying in bed awake and alert and in NAD.  EYES: Pupils equal, round, reactive to light. No scleral icterus. Extraocular muscles intact.  HEENT: Head atraumatic, normocephalic.  NECK:  Supple,  no jugular venous distention. No thyroid enlargement, no tenderness.  LUNGS: Normal breath sounds bilaterally, no wheezing, rales, rhonchi. No use of accessory muscles of respiration.  CARDIOVASCULAR: Irregularly regular no murmurs, rubs, or gallops.  ABDOMEN: Soft, NT, ND. Bowel sounds present.  EXTREMITIES: No cyanosis, clubbing, + 1-2 edema b/l NEUROLOGIC: Cranial nerves II through XII intact, no focal motor or sensory deficits appreciated bilaterally. Globally weak.  PSYCHIATRIC: Alert awake oriented x 3.  Follows simple commands. SKIN: No obvious rash, lesion, or ulcer.   LABORATORY PANEL:   CBC Recent Labs  Lab 04/21/18 0821  WBC 4.9  HGB 8.9*  HCT 26.6*  PLT 294    ------------------------------------------------------------------------------------------------------------------  Chemistries  Recent Labs  Lab 04/20/18 1412 04/21/18 0821  NA 139 138  K 3.8 4.5  CL 88* 88*  CO2 42* 37*  GLUCOSE 275* 347*  BUN 28* 27*  CREATININE 1.64* 1.65*  CALCIUM 8.1* 8.3*  MG  --  1.8  AST 30  --   ALT <5  --   ALKPHOS 124  --   BILITOT 1.0  --    ------------------------------------------------------------------------------------------------------------------  Cardiac Enzymes No results for input(s): TROPONINI in the last 168 hours. ------------------------------------------------------------------------------------------------------------------  RADIOLOGY:  No results found.   ASSESSMENT AND PLAN:   77 year old female with past medical history of paroxysmal atrial fibrillation status post pacemaker, previous history of TIA, hypertension, morbid obesity, diabetes, chronic kidney disease stage III, anxiety, GERD who presented to the hospital due to abdominal pain and noted to have partial small bowel obstruction.  -Acute on chronic diastolic CHF with volume overload Patient has gained 30 to 40 pounds over her dry weight. Lasix 60 mg IV every 8 hours is on hold the patient's blood pressure is soft  follow-up with cardiology Weight 289-->274--> 260.7 pounds   -Acute on chronic respiratory failure with hypoxia and hypercapnia- due to underlying COPD, morbid obesity, Aspiration Pneumonia. -Patient extubated 04/13/18 and Doing well from the respiratory standpoint.  Patient's sputum cultures were positive for Klebsiella. cont. IV ancef and will switch to oral antibiotics  -Atrial fibrillation paroxysmal no RVR appreciate cardiology input. Discontinued amiodarone drip  -Heparin drip as tolerated by the patient well.  Hemoglobin is stable 9.1-8.7 discontinued heparin and started  Eliquis -closely monitor patient given melena/GI bleed during this  admission IV Lopressor as needed for rate control. Metoprolol dose increased to 50 mg twice daily, but patient is hypotensive we will change the dose to 12.5 mg p.o. twice daily follow-up with cardiology discussed with Dr. Curt Bears   -AKI on CKD  stage G3a/A1  Creatinine 1.67-1.59-1.60-1.59-1.65 Avoid nephrotoxins and monitor renal function closely PCP to repeat BMP in 4 to 5 days  -Constipation resolved after giving soapsuds and Fleet enema    MiraLAX every day Dulcolax suppository as needed  -Right hip pain will get an x-ray and pain management as needed PT assessment  -Hypoalbuminemia with third spacing-IV albumin albumin trended up to 2.4  IV albumin  -.  GI bleed- patient was noted to have some dark/melanotic stools .  Patient has received 1 unit of packed red blood cells.  Hg up to 9.5 --9.1 -8.7 and stable and no bleeding overnight. ,  Cardiology is recommending to keep hemoglobin greater than 8.5 -A gastroenterology consult obtained and patient underwent an upper GI endoscopy which showed a gastric polyp which was bleeding and it was injected.  Bleeding has stopped.   continue PPI. - Patient tolerating PO , dysphagia 3 diet  -.  Acute  partial small bowel obstruction- cause of patient's worsening abdominal pain on admission but now resolved. NO acute issue  -.  Acute blood loss anemia- initially thought to be secondary to Mallory-Weiss tear, but patient has history of Hyperplastic polyps.  Status post upper GI endoscopy yesterday which showed a bleeding gastric polyp which was injected. -Hemoglobin improved posttransfusion stable at 9.5 and stable.  No other acute bleeding overnight.  -  Hypernatremia-  sodium normalized now.  -  Aspiration pneumonia- pt. Was on IV Unasyn.  Sputum cultures were positive for Klebsiella and its pansensitive.   -Completed the antibiotic course.  Antibiotics discontinued - High risk for aspiration.  Seen by speech therapy recommending dysphagia  3 diet with thin liquids  8. Hyperkalemia - resolved  PT eval-skilled nursing facility, anticipating tomorrow  All the records are reviewed and case discussed with Care Management/Social Worker. Management plans discussed with the patient, family and they are in agreement.  CODE STATUS: Full code  DVT Prophylaxis: Ted's & SCD's.   TOTAL TIME TAKING CARE OF THIS PATIENT: 33 minutes.   POSSIBLE D/C tomorrow if hemoglobin is stable, DEPENDING ON CLINICAL CONDITION and course.   Nicholes Mango M.D on 04/21/2018 at 1:16 PM  Between 7am to 6pm - Pager - [phone]   After 6pm go to [email or web address] - Technical brewer  [facility]ospital[facility]  Office  [phone]  CC: Primary care physician; [name], MD

## 2018-04-21 NOTE — Progress Notes (Signed)
Talked to Dr. Brett Albino about patient's BP of 109/36, patient is to received Metoprolol 12.5 mg, order to hold. Also scheduled to received 40 mg IV lasix which they decrease the dose to BP being soft, order ok to give Lasix. No other concern at the moment. RN will continue to monitor.

## 2018-04-21 NOTE — Progress Notes (Signed)
Pharmacy Electrolyte Monitoring Consult:  Pharmacy consulted to assist in monitoring and replacing electrolytes and glucose management in this 77 y.o. female admitted on 04/04/2018 with Hematemesis and Abdominal Pain  Pt on Lasix 60 mg IV every 8 hours now d/c'd 8/10.  Labs:  Sodium (mmol/L)  Date Value  04/20/2018 139  10/16/2014 141   Potassium (mmol/L)  Date Value  04/20/2018 3.8  10/16/2014 4.6   Magnesium (mg/dL)  Date Value  04/20/2018 1.9   Phosphorus (mg/dL)  Date Value  04/16/2018 4.3   Calcium (mg/dL)  Date Value  04/20/2018 8.1 (L)   Calcium, Total (mg/dL)  Date Value  10/16/2014 8.5   Albumin (g/dL)  Date Value  04/20/2018 2.5 (L)  10/29/2012 3.9    8/9: K 3.7 - ordered KCL 40 mEq PO x 1.  Mag 1.9 Goal potassium of 4.0, Mag of 2.0.   Assessment/Plan: K 4.5, Mag 1.8  Scr elevated, will order mag 1 g IV x1 for goal mag~2  Will continue to monitor BMP + Mg for electrolyte abnormalities.  Pharmacy will continue to monitor and adjust per consult.   Rayna Sexton, PharmD, BCPS Clinical Pharmacist 04/21/2018 9:17 AM

## 2018-04-21 NOTE — Progress Notes (Signed)
Progress Note  Patient Name: Deborah Obrien Date of Encounter: 04/21/2018  Primary Cardiologist: Caryl Comes  Subjective   Her main complaint is of shortness of breath and swelling in her hands and feet.  Her metoprolol and Lasix have been held due to low blood pressures this morning.  Fortunately her blood pressures have improved.  She had a blister on her right foot that was drained by surgery this morning.  Otherwise no major complaints.  Inpatient Medications    Scheduled Meds: . apixaban  5 mg Oral BID  . busPIRone  10 mg Oral BID  . furosemide  40 mg Intravenous BID  . insulin aspart  0-15 Units Subcutaneous TID WC  . insulin aspart  0-5 Units Subcutaneous QHS  . insulin aspart  4 Units Subcutaneous TID WC  . insulin glargine  13 Units Subcutaneous Daily  . ipratropium-albuterol  3 mL Nebulization BID  . lidocaine  1 patch Transdermal Q24H  . mouth rinse  15 mL Mouth Rinse BID  . metoprolol tartrate  12.5 mg Oral BID  . multivitamin with minerals  1 tablet Oral Daily  . pantoprazole  40 mg Oral BID AC  . PARoxetine  20 mg Oral Daily  . polyethylene glycol  17 g Oral Daily  . protein supplement shake  11 oz Oral BID BM  . senna-docusate  1 tablet Oral q morning - 10a  . sodium chloride flush  10-40 mL Intracatheter Q12H  . traZODone  50 mg Oral QHS   Continuous Infusions: . albumin human 12.5 g (04/21/18 0852)   PRN Meds: acetaminophen **OR** acetaminophen, benzocaine-Menthol, bisacodyl, dibucaine, HYDROcodone-acetaminophen, metoprolol tartrate, morphine injection, neomycin-bacitracin-polymyxin, ondansetron **OR** ondansetron (ZOFRAN) IV, phenol, polyvinyl alcohol, sodium chloride flush, witch hazel-glycerin   Vital Signs    Vitals:   04/20/18 2045 04/21/18 0508 04/21/18 0805 04/21/18 0820  BP:  (!) 114/46  (!) 117/40  Pulse:  (!) 59  65  Resp:  17    Temp:  98.3 F (36.8 C)    TempSrc:  Oral    SpO2: 100% 97% 98% 96%  Weight:  116.9 kg    Height:         Intake/Output Summary (Last 24 hours) at 04/21/2018 1409 Last data filed at 04/21/2018 9767 Gross per 24 hour  Intake 290 ml  Output 2500 ml  Net -2210 ml   Filed Weights   04/19/18 0442 04/20/18 0431 04/21/18 0508  Weight: 121.7 kg 118.5 kg 116.9 kg    Telemetry    Atrial paced- Personally Reviewed  ECG    None new- Personally Reviewed  Physical Exam   GEN: Well nourished, well developed, in no acute distress  HEENT: normal  Neck: no JVD, carotid bruits, or masses Cardiac: RRR; no murmurs, rubs, or gallops, 1+ edema  Respiratory:  clear to auscultation bilaterally, normal work of breathing GI: soft, nontender, nondistended, + BS MS: no deformity or atrophy  Skin: warm and dry, device site well healed Neuro:  Strength and sensation are intact Psych: euthymic mood, full affect   Labs    Chemistry Recent Labs  Lab 04/18/18 0518 04/19/18 3419 04/20/18 0708 04/20/18 1412 04/21/18 0821  NA 142 140 143 139 138  K 3.5 3.9 3.7 3.8 4.5  CL 96* 93* 91* 88* 88*  CO2 34* 36* 38* 42* 37*  GLUCOSE 311* 259* 171* 275* 347*  BUN 26* 26* 24* 28* 27*  CREATININE 1.59* 1.60* 1.59* 1.64* 1.65*  CALCIUM 8.0* 8.0* 8.1* 8.1*  8.3*  PROT 6.2* 6.1*  --  6.2*  --   ALBUMIN 2.4* 2.4*  --  2.5*  --   AST 25 25  --  30  --   ALT <5 <5  --  <5  --   ALKPHOS 134* 110  --  124  --   BILITOT 0.8 0.9  --  1.0  --   GFRNONAA 30* 30* 30* 29* 29*  GFRAA 35* 35* 35* 34* 33*  ANIONGAP 12 11 14 9 13      Hematology Recent Labs  Lab 04/19/18 1002 04/20/18 0708 04/21/18 0821  WBC 5.3 5.4 4.9  RBC 2.90* 2.70* 2.86*  HGB 9.1* 8.7* 8.9*  HCT 27.3* 25.3* 26.6*  MCV 94.4 94.0 93.1  MCH 31.3 32.3 31.0  MCHC 33.1 34.3 33.3  RDW 16.1* 15.7* 15.6*  PLT 318 326 294    Cardiac EnzymesNo results for input(s): TROPONINI in the last 168 hours. No results for input(s): TROPIPOC in the last 168 hours.   BNPNo results for input(s): BNP, PROBNP in the last 168 hours.   DDimer No results for  input(s): DDIMER in the last 168 hours.   Radiology    Dg Hip Unilat With Pelvis 2-3 Views Right  Result Date: 04/17/2018 IMPRESSION: Normal right hip. Electronically Signed   By: Marijo Conception, M.D.   On: 04/17/2018 15:44    Cardiac Studies   Echo 04/10/2018: Study Conclusions  - Left ventricle: The cavity size was normal. Wall thickness was normal. Systolic function was normal. The estimated ejection fraction was in the range of 60% to 65%. - Mitral valve: Moderately thickened, mildly calcified leaflets . - Left atrium: The atrium was mildly dilated. - Right ventricle: The cavity size was mildly to moderately dilated. Wall thickness was mildly increased. - Right atrium: The atrium was mildly dilated. - Tricuspid valve: There was moderate regurgitation. - Pulmonary arteries: Systolic pressure was moderately increased, estimated to be 50 mm Hg plus central venous/right atrial pressure.  Patient Profile     77 y.o. 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  Assessment & Plan    1. PAF with RVR: Currently she is back in sinus rhythm today.  She is currently on Eliquis for anticoagulation.  It is possible that her overall illness has been contributing to her atrial fibrillation.  Should she return to atrial fibrillation, I am okay with rates in the 90s to 100 resting.  Would continue low-dose metoprolol should her blood pressure tolerate.   2. Acute respiratory failure with hypoxia and hypercapnia: Continued volume overload.  She does complain of shortness of breath when her oxygen was reduced.  We Aliviah Spain plan to continue with diuresis.  3. 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  She is only put out 1 L net since admission.  Would benefit from further diuresis.  We Ismelda Weatherman give her 60 mg of Lasix IV twice a day  today.  Should her creatinine tolerate, would continue IV diuresis through the day tomorrow.  Would switch her after that and she could likely return to rehab.  4. Partial SBO:  Plan per primary team  5. Acute blood loss anemia/upper GI bleed: No further bloody bowel movements.  Continue with current management.  6. AKI:  Creatinine appears to be at baseline  7. Hypoalbuminemia: -Received IV albumin on 7/31 -Soft diet  Charlize Hathaway help significantly  -Albumin trending up 2.3  2.4  8. Disp:  Likely Mohamad Bruso need more days of diuresis.   For questions or updates, please contact [place] Please consult [email or web address] for contact info under Cardiology/STEMI.    Signed, Jermon Chalfant Meredith Leeds, MD  [facility] 04/21/2018 2:09 PM

## 2018-04-22 ENCOUNTER — Inpatient Hospital Stay: Payer: Medicare Other

## 2018-04-22 LAB — BASIC METABOLIC PANEL
Anion gap: 11 (ref 5–15)
BUN: 29 mg/dL — AB (ref 8–23)
CALCIUM: 8.4 mg/dL — AB (ref 8.9–10.3)
CHLORIDE: 90 mmol/L — AB (ref 98–111)
CO2: 39 mmol/L — ABNORMAL HIGH (ref 22–32)
CREATININE: 1.55 mg/dL — AB (ref 0.44–1.00)
GFR, EST AFRICAN AMERICAN: 36 mL/min — AB (ref >60)
GFR, EST NON AFRICAN AMERICAN: 31 mL/min — AB (ref >60)
Glucose, Bld: 281 mg/dL — ABNORMAL HIGH (ref 70–99)
Potassium: 3.8 mmol/L (ref 3.5–5.1)
SODIUM: 140 mmol/L (ref 135–145)

## 2018-04-22 LAB — GLUCOSE, CAPILLARY
Glucose-Capillary: 298 mg/dL — ABNORMAL HIGH (ref 70–99)
Glucose-Capillary: 192 mg/dL — ABNORMAL HIGH (ref 70–99)
Glucose-Capillary: 218 mg/dL — ABNORMAL HIGH (ref 70–99)
Glucose-Capillary: 288 mg/dL — ABNORMAL HIGH (ref 70–99)

## 2018-04-22 LAB — MAGNESIUM: MAGNESIUM: 1.7 mg/dL (ref 1.7–2.4)

## 2018-04-22 MED ORDER — MAGNESIUM SULFATE 2 GM/50ML IV SOLN
2.0000 g | Freq: Once | INTRAVENOUS | Status: AC
  Administered 2018-04-22: 2 g via INTRAVENOUS
  Filled 2018-04-22: qty 50

## 2018-04-22 MED ORDER — DIPHENHYDRAMINE HCL 25 MG PO CAPS
25.0000 mg | ORAL_CAPSULE | Freq: Every day | ORAL | Status: DC
  Administered 2018-04-22: 25 mg via ORAL
  Filled 2018-04-22: qty 1

## 2018-04-22 MED ORDER — POTASSIUM CHLORIDE CRYS ER 20 MEQ PO TBCR
20.0000 meq | EXTENDED_RELEASE_TABLET | Freq: Once | ORAL | Status: AC
  Administered 2018-04-22: 20 meq via ORAL
  Filled 2018-04-22: qty 1

## 2018-04-22 NOTE — Progress Notes (Signed)
[facility] [place] NAME: Deborah Obrien    MR#:  [id number]  DATE OF BIRTH:  Apr 29, 1941  SUBJECTIVE:   Right foot blister debrided yesterday, reporting severe right hip pain and unable to sleep last night, tolerating modified diet no further acute bleeding   REVIEW OF SYSTEMS:    Review of Systems  Constitutional: Negative for chills and fever.  HENT: Negative for congestion and tinnitus.   Eyes: Negative for blurred vision and double vision.  Respiratory: Negative for cough, shortness of breath and wheezing.   Cardiovascular: Negative for chest pain, orthopnea and PND.  Gastrointestinal: Negative for abdominal pain, diarrhea, nausea and vomiting.  Genitourinary: Negative for dysuria and hematuria.  Neurological: Negative for dizziness, sensory change and focal weakness.  All other systems reviewed and are negative.   Nutrition: NPO Tolerating Diet: no Tolerating PT: Await Eval.   DRUG ALLERGIES:   Allergies  Allergen Reactions  . Cephalosporins Other (See Comments)    Reaction:  Unknown   . Macrolides And Ketolides Other (See Comments)    Reaction:  Unknown   . Meperidine Shortness Of Breath, Nausea Only and Other (See Comments)    Reaction:  Stomach pain   . Prednisone Anaphylaxis and Other (See Comments)    Reaction:  Increases pts blood sugar  Pt states that she is allergic to all steroids.    . Shellfish Allergy Anaphylaxis, Shortness Of Breath, Diarrhea, Nausea Only and Rash    Reaction:  Stomach pain   . Sulfa Antibiotics Shortness Of Breath, Diarrhea, Nausea Only and Other (See Comments)    Reaction:  Stomach pain   . Uloric [Febuxostat] Anaphylaxis    Locks pt's body up   . Aspirin Other (See Comments)    Reaction:  Stomach pain   . Celecoxib Other (See Comments)    Reaction:  GI bleed, weakness, and stomach pain.    . Cephalexin Diarrhea, Nausea Only and Other (See Comments)    Reaction:  Stomach pain   .  Erythromycin Diarrhea, Nausea Only and Other (See Comments)    Reaction:  Stomach pain   . Hydromorphone Other (See Comments)    Reaction:  Hypotension   . Iodinated Diagnostic Agents Rash and Other (See Comments)    Pt states that she is unable to have because she has chronic kidney disease.    Marland Kitchen Oxycodone Other (See Comments)    Reaction:  Stomach pain   . Atorvastatin Nausea Only and Other (See Comments)    Reaction:  Weakness   . Codeine Diarrhea, Nausea Only and Other (See Comments)    Reaction:  Stomach pain Pt tolerates morphine   . Doxycycline     Stomach pain   . Tape Other (See Comments)    Reaction:  Causes pts skin to tear  Pt states that she is able to use paper tape.    . Valdecoxib Swelling and Other (See Comments)    Pt states that her hands and feet swell.    . Iodine Rash    VITALS:  Blood pressure (!) 114/44, pulse 65, temperature 97.9 F (36.6 C), temperature source Oral, resp. rate 18, height 5\' 6"  (1.676 m), weight 116.9 kg, SpO2 100 %.  PHYSICAL EXAMINATION:   Physical Exam  GENERAL:  77 y.o.-year-old obese patient lying in bed awake and alert and in NAD.  EYES: Pupils equal, round, reactive to light. No scleral icterus. Extraocular muscles intact.  HEENT: Head atraumatic, normocephalic.  NECK:  Supple, no jugular venous distention. No thyroid enlargement, no tenderness.  LUNGS: Normal breath sounds bilaterally, no wheezing, rales, rhonchi. No use of accessory muscles of respiration.  CARDIOVASCULAR: Irregularly regular no murmurs, rubs, or gallops.  ABDOMEN: Soft, NT, ND. Bowel sounds present.  EXTREMITIES: No cyanosis, clubbing, + 1-2 edema b/l NEUROLOGIC: Cranial nerves II through XII intact, no focal motor or sensory deficits appreciated bilaterally. Globally weak.  PSYCHIATRIC: Alert awake oriented x 3.  Follows simple commands. SKIN: No obvious rash, lesion, or ulcer.   LABORATORY PANEL:   CBC Recent Labs  Lab 04/21/18 0821  WBC 4.9  HGB  8.9*  HCT 26.6*  PLT 294   ------------------------------------------------------------------------------------------------------------------  Chemistries  Recent Labs  Lab 04/20/18 1412  04/22/18 0953  NA 139   < > 140  K 3.8   < > 3.8  CL 88*   < > 90*  CO2 42*   < > 39*  GLUCOSE 275*   < > 281*  BUN 28*   < > 29*  CREATININE 1.64*   < > 1.55*  CALCIUM 8.1*   < > 8.4*  MG  --    < > 1.7  AST 30  --   --   ALT <5  --   --   ALKPHOS 124  --   --   BILITOT 1.0  --   --    < > = values in this interval not displayed.   ------------------------------------------------------------------------------------------------------------------  Cardiac Enzymes No results for input(s): TROPONINI in the last 168 hours. ------------------------------------------------------------------------------------------------------------------  RADIOLOGY:  No results found.   ASSESSMENT AND PLAN:   77 year old female with past medical history of paroxysmal atrial fibrillation status post pacemaker, previous history of TIA, hypertension, morbid obesity, diabetes, chronic kidney disease stage III, anxiety, GERD who presented to the hospital due to abdominal pain and noted to have partial small bowel obstruction.  -Acute on chronic diastolic CHF with volume overload Patient has gained 30 to 40 pounds over her dry weight. Lasix 60 mg IV every 12 hours is on hold the patient's blood pressure is soft  follow-up with cardiology 4.65 L negative output in the past 24 oh hours  -Severe right hip pain x-ray negative we will get CT hip Pain management as needed and physical therapy   -Acute on chronic respiratory failure with hypoxia and hypercapnia- due to underlying COPD, morbid obesity, Aspiration Pneumonia. -Patient extubated 04/13/18 and Doing well from the respiratory standpoint.  Patient's sputum cultures were positive for Klebsiella. cont. IV ancef and will switch to oral antibiotics  -Atrial  fibrillation paroxysmal no RVR appreciate cardiology input. Discontinued amiodarone drip    Hemoglobin is stable 9.1-8.7 discontinued heparin and started  Eliquis -closely monitor patient given melena/GI bleed during this admission IV Lopressor as needed for rate control. Metoprolol dose increased to 50 mg twice daily, but patient is hypotensive we will change the dose to 12.5 mg p.o. twice daily follow-up with cardiology discussed with Dr. Curt Bears  -Insomnia Benadryl 25 mg nightly patient is refusing Ambien at this time   -AKI on CKD  stage G3a/A1  At her baseline Creatinine 1.67-1.59-1.60-1.59-1.65 Avoid nephrotoxins and monitor renal function closely PCP to repeat BMP in 4 to 5 days  -Constipation resolved after giving soapsuds and Fleet enema    MiraLAX every day Dulcolax suppository as needed  -Right hip pain will get an x-ray and pain management as needed PT assessment  -Hypoalbuminemia with third spacing-IV albumin albumin trended up  to 2.4  IV albumin  -.  GI bleed- patient was noted to have some dark/melanotic stools .  Patient has received 1 unit of packed red blood cells.  Hg up to 9.5 --9.1 -8.7 and stable and no bleeding overnight. ,  Cardiology is recommending to keep hemoglobin greater than 8.5 -A gastroenterology consult obtained and patient underwent an upper GI endoscopy which showed a gastric polyp which was bleeding and it was injected.  Bleeding has stopped.   continue PPI. - Patient tolerating PO , dysphagia 3 diet  -.  Acute partial small bowel obstruction- cause of patient's worsening abdominal pain on admission but now resolved. NO acute issue  -.  Acute blood loss anemia- initially thought to be secondary to Mallory-Weiss tear, but patient has history of Hyperplastic polyps.  Status post upper GI endoscopy yesterday which showed a bleeding gastric polyp which was injected. -Hemoglobin improved posttransfusion stable at 9.5 and stable.  No other acute  bleeding overnight.  -  Hypernatremia-  sodium normalized now.  -  Aspiration pneumonia- pt. Was on IV Unasyn.  Sputum cultures were positive for Klebsiella and its pansensitive.   -Completed the antibiotic course.  Antibiotics discontinued - High risk for aspiration.  Seen by speech therapy recommending dysphagia 3 diet with thin liquids  8. Hyperkalemia - resolved  PT eval-skilled nursing facility, anticipating tomorrow  All the records are reviewed and case discussed with Care Management/Social Worker. Management plans discussed with the patient, family and they are in agreement.  CODE STATUS: Full code  DVT Prophylaxis: Ted's & SCD's.   TOTAL TIME TAKING CARE OF THIS PATIENT: 33 minutes.   POSSIBLE D/C tomorrow if hemoglobin is stable, DEPENDING ON CLINICAL CONDITION and course.   Nicholes Mango M.D on 04/22/2018 at 12:48 PM  Between 7am to 6pm - Pager - [phone]   After 6pm go to [email or web address] - Technical brewer  Hospitalists  Office  [phone]  CC: Primary care physician; [name], MD

## 2018-04-22 NOTE — Plan of Care (Signed)
  Problem: Pain Managment: Goal: General experience of comfort will improve Outcome: Not Progressing  Pt c/o right ride hip/back pain, requests for pain medication around the clock this shift.

## 2018-04-22 NOTE — Progress Notes (Signed)
Pharmacy Electrolyte Monitoring Consult:  Pharmacy consulted to assist in monitoring and replacing electrolytes and glucose management in this 77 y.o. female admitted on 04/04/2018 with Hematemesis and Abdominal Pain  Pt on Lasix 60 mg IV every 8 hours now d/c'd 8/10. Lasix 40 mg IV BID restarted 8/10 later in day  Labs:  Sodium (mmol/L)  Date Value  04/22/2018 140  10/16/2014 141   Potassium (mmol/L)  Date Value  04/22/2018 3.8  10/16/2014 4.6   Magnesium (mg/dL)  Date Value  04/22/2018 1.7   Phosphorus (mg/dL)  Date Value  04/16/2018 4.3   Calcium (mg/dL)  Date Value  04/22/2018 8.4 (L)   Calcium, Total (mg/dL)  Date Value  10/16/2014 8.5   Albumin (g/dL)  Date Value  04/20/2018 2.5 (L)  10/29/2012 3.9    Goal potassium of 4.0, Mag of 2.0.   Assessment/Plan: K 3.8, Mag 1.7 - will order KCl 20 mEq PO x1 and mag sulfate 2 g IV x1 as pt is on IV lasix. Delay in getting labs due to trouble obtaining sample/pt delaying draw.  Will continue to monitor BMP + Mg in AM for electrolyte abnormalities.  Pharmacy will continue to monitor and adjust per consult.   Rayna Sexton, PharmD, BCPS Clinical Pharmacist 04/22/2018 12:32 PM

## 2018-04-22 NOTE — Progress Notes (Signed)
Pt's family expressed concern about her hip pain and would like a CT scan to follow up the X-ray of her R hip. On call MD paged.

## 2018-04-23 ENCOUNTER — Inpatient Hospital Stay: Admission: RE | Admit: 2018-04-23 | Payer: Medicare Other | Source: Ambulatory Visit

## 2018-04-23 LAB — BASIC METABOLIC PANEL
ANION GAP: 8 (ref 5–15)
BUN: 31 mg/dL — ABNORMAL HIGH (ref 8–23)
CO2: 41 mmol/L — AB (ref 22–32)
Calcium: 8.5 mg/dL — ABNORMAL LOW (ref 8.9–10.3)
Chloride: 88 mmol/L — ABNORMAL LOW (ref 98–111)
Creatinine, Ser: 1.54 mg/dL — ABNORMAL HIGH (ref 0.44–1.00)
GFR calc Af Amer: 36 mL/min — ABNORMAL LOW (ref >60)
GFR calc non Af Amer: 31 mL/min — ABNORMAL LOW (ref >60)
GLUCOSE: 278 mg/dL — AB (ref 70–99)
Potassium: 4.4 mmol/L (ref 3.5–5.1)
Sodium: 137 mmol/L (ref 135–145)

## 2018-04-23 LAB — GLUCOSE, CAPILLARY
Glucose-Capillary: 297 mg/dL — ABNORMAL HIGH (ref 70–99)
Glucose-Capillary: 403 mg/dL — ABNORMAL HIGH (ref 70–99)

## 2018-04-23 LAB — MAGNESIUM: Magnesium: 2.1 mg/dL (ref 1.7–2.4)

## 2018-04-23 MED ORDER — INSULIN GLARGINE 100 UNIT/ML ~~LOC~~ SOLN
4.0000 [IU] | Freq: Once | SUBCUTANEOUS | Status: DC
  Filled 2018-04-23: qty 0.04

## 2018-04-23 MED ORDER — METOPROLOL TARTRATE 25 MG PO TABS: 25.0000 mg | ORAL_TABLET | Freq: Two times a day (BID) | ORAL | Status: DC

## 2018-04-23 MED ORDER — SENNOSIDES-DOCUSATE SODIUM 8.6-50 MG PO TABS: 1.0000 | ORAL_TABLET | Freq: Every morning | ORAL | Status: DC

## 2018-04-23 MED ORDER — IPRATROPIUM-ALBUTEROL 0.5-2.5 (3) MG/3ML IN SOLN: 3.0000 mL | Freq: Two times a day (BID) | RESPIRATORY_TRACT | Status: DC

## 2018-04-23 MED ORDER — PREMIER PROTEIN SHAKE: 11.0000 [oz_av] | Freq: Two times a day (BID) | ORAL | 0 refills | Status: DC

## 2018-04-23 MED ORDER — LIDOCAINE 5 % EX PTCH: 1.0000 | MEDICATED_PATCH | CUTANEOUS | 0 refills | Status: DC

## 2018-04-23 MED ORDER — ADULT MULTIVITAMIN W/MINERALS CH: 1.0000 | ORAL_TABLET | Freq: Every day | ORAL | Status: DC

## 2018-04-23 MED ORDER — METOPROLOL TARTRATE 25 MG PO TABS
25.0000 mg | ORAL_TABLET | Freq: Two times a day (BID) | ORAL | Status: DC
  Filled 2018-04-23: qty 1

## 2018-04-23 MED ORDER — INSULIN ASPART 100 UNIT/ML ~~LOC~~ SOLN: 4.0000 [IU] | Freq: Three times a day (TID) | SUBCUTANEOUS | 11 refills | Status: DC

## 2018-04-23 MED ORDER — INSULIN GLARGINE 100 UNIT/ML ~~LOC~~ SOLN
15.0000 [IU] | Freq: Every day | SUBCUTANEOUS | Status: DC
  Administered 2018-04-23: 15 [IU] via SUBCUTANEOUS
  Filled 2018-04-23: qty 0.15

## 2018-04-23 MED ORDER — POTASSIUM CHLORIDE CRYS ER 20 MEQ PO TBCR
20.0000 meq | EXTENDED_RELEASE_TABLET | Freq: Once | ORAL | Status: AC
  Administered 2018-04-23: 20 meq via ORAL
  Filled 2018-04-23: qty 1

## 2018-04-23 MED ORDER — POLYETHYLENE GLYCOL 3350 17 G PO PACK: 17.0000 g | PACK | Freq: Every day | ORAL | 0 refills | Status: DC

## 2018-04-23 MED ORDER — BENZOCAINE-MENTHOL 20-0.5 % EX AERO: 1.0000 "application " | INHALATION_SPRAY | Freq: Four times a day (QID) | CUTANEOUS | Status: DC | PRN

## 2018-04-23 MED ORDER — BISACODYL 10 MG RE SUPP: 10.0000 mg | Freq: Every day | RECTAL | 0 refills | Status: DC | PRN

## 2018-04-23 MED ORDER — ACETAMINOPHEN 325 MG PO TABS: 650.0000 mg | ORAL_TABLET | Freq: Four times a day (QID) | ORAL | Status: DC | PRN

## 2018-04-23 MED ORDER — INSULIN ASPART 100 UNIT/ML ~~LOC~~ SOLN: 0.0000 [IU] | Freq: Three times a day (TID) | SUBCUTANEOUS | 11 refills | Status: DC

## 2018-04-23 MED ORDER — FERROUS SULFATE 325 (65 FE) MG PO TBEC: 325.0000 mg | DELAYED_RELEASE_TABLET | Freq: Two times a day (BID) | ORAL | 3 refills | Status: DC

## 2018-04-23 MED ORDER — HYDROCODONE-ACETAMINOPHEN 5-325 MG PO TABS: 1.0000 | ORAL_TABLET | Freq: Four times a day (QID) | ORAL | 0 refills | Status: DC | PRN

## 2018-04-23 MED ORDER — DIPHENHYDRAMINE HCL 25 MG PO CAPS: 25.0000 mg | ORAL_CAPSULE | Freq: Every day | ORAL | 0 refills | Status: DC

## 2018-04-23 MED ORDER — INSULIN ASPART 100 UNIT/ML ~~LOC~~ SOLN: 0.0000 [IU] | Freq: Every day | SUBCUTANEOUS | 11 refills | Status: DC

## 2018-04-23 MED ORDER — BACITRACIN-NEOMYCIN-POLYMYXIN 400-5-5000 EX OINT: TOPICAL_OINTMENT | CUTANEOUS | 0 refills | Status: DC | PRN

## 2018-04-23 MED ORDER — FUROSEMIDE 40 MG PO TABS: 40.0000 mg | ORAL_TABLET | Freq: Two times a day (BID) | ORAL | Status: DC

## 2018-04-23 MED ORDER — INSULIN GLARGINE 100 UNIT/ML ~~LOC~~ SOLN: 18.0000 [IU] | Freq: Every day | SUBCUTANEOUS | 11 refills | Status: DC

## 2018-04-23 MED ORDER — ONDANSETRON HCL 4 MG PO TABS: 4.0000 mg | ORAL_TABLET | Freq: Four times a day (QID) | ORAL | 0 refills | Status: DC | PRN

## 2018-04-23 MED ORDER — FUROSEMIDE 40 MG PO TABS
40.0000 mg | ORAL_TABLET | Freq: Two times a day (BID) | ORAL | Status: DC
  Administered 2018-04-23: 40 mg via ORAL
  Filled 2018-04-23: qty 1

## 2018-04-23 MED ORDER — DIBUCAINE 1 % RE OINT: 1.0000 "application " | TOPICAL_OINTMENT | RECTAL | 0 refills | Status: DC | PRN

## 2018-04-23 NOTE — Clinical Social Work Note (Signed)
Patient to be d/c'ed today to [facility].  Patient and family agreeable to plans will transport via ems RN to call report [phone].  Patient's family was at bedside and are aware that patient is discharging today.  Evette Cristal, MSW, [place]

## 2018-04-23 NOTE — Plan of Care (Signed)

## 2018-04-23 NOTE — Progress Notes (Signed)
Pt discharged to [facility] via EMS this afternoon, feeling much more comfortable, with decreasing complaints of right hip/back pain. VSS, axox4, PICC line removed, no complications at time of departure.

## 2018-04-23 NOTE — Discharge Summary (Signed)
[facility] NAME: Deborah Obrien    MR#:  [id number]  DATE OF BIRTH:  [birth year]  DATE OF ADMISSION:  04/04/2018 ADMITTING PHYSICIAN: Harrie Foreman, MD  DATE OF DISCHARGE: 04/23/18  PRIMARY CARE PHYSICIAN: [name], MD    ADMISSION DIAGNOSIS:  Small bowel obstruction ([place]) [K56.609] Upper GI bleed [K92.2]  DISCHARGE DIAGNOSIS:  Principal Problem:   Intestinal obstruction (HCC) Active Problems:   PAF (paroxysmal atrial fibrillation) (HCC)   Body mass index (BMI) of 40.0-44.9 in adult [facility])   Acute on chronic renal insufficiency   Diabetic neuropathy with neurologic complication (HCC)   Sinus node dysfunction (HCC)   Atrial fibrillation with RVR (HCC)   Hematemesis   Acute respiratory failure (HCC)   Chronic anticoagulation   Pacemaker   H/O TIA (transient ischemic attack) and stroke   CAD (coronary artery disease)   SECONDARY DIAGNOSIS:   Past Medical History:  Diagnosis Date  . A-fib ([place]) 2016  . Acid reflux   . Anxiety   . Bowel obstruction ([place])   . CHF (congestive heart failure) ([place]) 2017  . Chronic kidney disease (CKD) stage G3a/A1, moderately decreased glomerular filtration rate (GFR) between 45-59 mL/min/1.73 square meter and albuminuria creatinine ratio less than 30 mg/g (HCC)   . Diabetes mellitus without complication ([place])   . Dyspnea   . Fatty liver   . GI bleed   . Gout   . Hypertension   . Morbid obesity ([place])   . Osteoarthritis   . Osteoporosis   . Paroxysmal atrial fibrillation (HCC)   . Presence of permanent cardiac pacemaker   . Sinus node dysfunction ([place]) 08/24/2017  . TIA (transient ischemic attack)     HOSPITAL COURSE:   HPI: The patient with past medical history of atrial fibrillation and gastric problems presents to the emergency department complaining of abdominal pain, nausea and vomiting.  The patient admits to blood in her vomit as well as coffee-ground  emesis.  Imaging revealed small bowel obstruction but due to the patient's hematemesis the emergency department staff called the hospitalist service for admission for evaluation from gastroenterology.  Acute on chronic diastolic CHF with volume overload Patient has gained 30 to 40 pounds over her dry weight. Lasix 60 mg IV every 12 hours is changed to 40 mg of Lasix p.o. twice daily 7.8 L negative output since admission Okay to discharge patient from cardiology standpoint.  Outpatient follow-up Daily weight and intake and output monitoring  -Severe right hip pain x-ray negative  CT hip no acute findings but has moderate osteo-arthritis Pain management as needed and physical therapy   -Acute on chronic respiratory failure with hypoxia and hypercapnia- due to underlying COPD, morbid obesity, Aspiration Pneumonia. -Patient extubated 04/13/18 and Doing well from the respiratory standpoint.  Patient's sputum cultures were positive for Klebsiella.  Completed IV Ancef course  -Atrial fibrillation paroxysmal no RVR appreciate cardiology input. Discontinued amiodarone drip    Hemoglobin is stable 9.1-8.7--8.9 discontinued heparin and started  Eliquis -closely monitor patient given melena/GI bleed during this admission IV Lopressor as needed for rate control. Metoprolol dose increased to 50 mg twice daily, but patient is hypotensive we will change the dose to 25 mg p.o. twice daily follow-up with cardiology discussed with Dr. Curt Bears  -Insomnia Benadryl 25 mg nightly patient is refusing Ambien at this time   -AKI on CKD  stage G3a/A1  At her baseline Creatinine 1.67-1.59-1.60-1.59-1.65-1.54 Avoid nephrotoxins and monitor  renal function closely PCP to repeat BMP in 4 to 5 days  -Constipation resolved after giving soapsuds and Fleet enema    MiraLAX every day Dulcolax suppository as needed  -Right hip pain will get an x-ray and pain management as needed PT  assessment  -Hypoalbuminemia with third spacing-IV albumin albumin trended up to 2.4  IV albumin  -.  GI bleed- patient was noted to have some dark/melanotic stools .  Patient has received 1 unit of packed red blood cells.  Hg up to 9.5 --9.1 -8.7 and stable and no bleeding overnight. ,  Cardiology is recommending to keep hemoglobin greater than 8.5 -A gastroenterology consult obtained and patient underwent an upper GI endoscopy which showed a gastric polyp which was bleeding and it was injected.  Bleeding has stopped.   continue PPI. - Patient tolerating PO , dysphagia 3 diet  -.  Acute partial small bowel obstruction- cause of patient's worsening abdominal pain on admission but now resolved. NO acute issue  -.  Acute blood loss anemia- initially thought to be secondary to Mallory-Weiss tear, but patient has history of Hyperplastic polyps.  Status post upper GI endoscopy yesterday which showed a bleeding gastric polyp which was injected. -Hemoglobin improved posttransfusion stable at 9.5 and stable.  No other acute bleeding overnight.  -  Hypernatremia-  sodium normalized now.  -  Aspiration pneumonia- pt. Was on IV Unasyn.  Sputum cultures were positive for Klebsiella and its pansensitive.   -Completed the antibiotic course.  Antibiotics discontinued - High risk for aspiration.  Seen by speech therapy recommending dysphagia 3 diet with thin liquids  8. Hyperkalemia - resolved  PT eval-skilled nursing facility, discharge today  DISCHARGE CONDITIONS:   stable  CONSULTS OBTAINED:  Treatment Team:  Wellington [place], MD   PROCEDURES  None   DRUG ALLERGIES:   Allergies  Allergen Reactions  . Cephalosporins Other (See Comments)    Reaction:  Unknown   . Macrolides And Ketolides Other (See Comments)    Reaction:  Unknown   . Meperidine Shortness Of Breath, Nausea Only and Other (See Comments)    Reaction:  Stomach pain   . Prednisone Anaphylaxis and Other (See  Comments)    Reaction:  Increases pts blood sugar  Pt states that she is allergic to all steroids.    . Shellfish Allergy Anaphylaxis, Shortness Of Breath, Diarrhea, Nausea Only and Rash    Reaction:  Stomach pain   . Sulfa Antibiotics Shortness Of Breath, Diarrhea, Nausea Only and Other (See Comments)    Reaction:  Stomach pain   . Uloric [Febuxostat] Anaphylaxis    Locks pt's body up   . Aspirin Other (See Comments)    Reaction:  Stomach pain   . Celecoxib Other (See Comments)    Reaction:  GI bleed, weakness, and stomach pain.    . Cephalexin Diarrhea, Nausea Only and Other (See Comments)    Reaction:  Stomach pain   . Erythromycin Diarrhea, Nausea Only and Other (See Comments)    Reaction:  Stomach pain   . Hydromorphone Other (See Comments)    Reaction:  Hypotension   . Iodinated Diagnostic Agents Rash and Other (See Comments)    Pt states that she is unable to have because she has chronic kidney disease.    Marland Kitchen Oxycodone Other (See Comments)    Reaction:  Stomach pain   . Atorvastatin Nausea Only and Other (See Comments)    Reaction:  Weakness   . Codeine Diarrhea,  Nausea Only and Other (See Comments)    Reaction:  Stomach pain Pt tolerates morphine   . Doxycycline     Stomach pain   . Tape Other (See Comments)    Reaction:  Causes pts skin to tear  Pt states that she is able to use paper tape.    . Valdecoxib Swelling and Other (See Comments)    Pt states that her hands and feet swell.    . Iodine Rash    DISCHARGE MEDICATIONS:   Allergies as of 04/23/2018      Reactions   Cephalosporins Other (See Comments)   Reaction:  Unknown    Macrolides And Ketolides Other (See Comments)   Reaction:  Unknown    Meperidine Shortness Of Breath, Nausea Only, Other (See Comments)   Reaction:  Stomach pain    Prednisone Anaphylaxis, Other (See Comments)   Reaction:  Increases pts blood sugar  Pt states that she is allergic to all steroids.     Shellfish Allergy Anaphylaxis,  Shortness Of Breath, Diarrhea, Nausea Only, Rash   Reaction:  Stomach pain    Sulfa Antibiotics Shortness Of Breath, Diarrhea, Nausea Only, Other (See Comments)   Reaction:  Stomach pain    Uloric [febuxostat] Anaphylaxis   Locks pt's body up   Aspirin Other (See Comments)   Reaction:  Stomach pain    Celecoxib Other (See Comments)   Reaction:  GI bleed, weakness, and stomach pain.     Cephalexin Diarrhea, Nausea Only, Other (See Comments)   Reaction:  Stomach pain    Erythromycin Diarrhea, Nausea Only, Other (See Comments)   Reaction:  Stomach pain    Hydromorphone Other (See Comments)   Reaction:  Hypotension    Iodinated Diagnostic Agents Rash, Other (See Comments)   Pt states that she is unable to have because she has chronic kidney disease.     Oxycodone Other (See Comments)   Reaction:  Stomach pain    Atorvastatin Nausea Only, Other (See Comments)   Reaction:  Weakness    Codeine Diarrhea, Nausea Only, Other (See Comments)   Reaction:  Stomach pain Pt tolerates morphine    Doxycycline    Stomach pain    Tape Other (See Comments)   Reaction:  Causes pts skin to tear  Pt states that she is able to use paper tape.     Valdecoxib Swelling, Other (See Comments)   Pt states that her hands and feet swell.     Iodine Rash      Medication List    STOP taking these medications   amiodarone 400 MG tablet Commonly known as:  PACERONE   insulin NPH-regular Human (70-30) 100 UNIT/ML injection Commonly known as:  NOVOLIN 70/30   ketoconazole 2 % cream Commonly known as:  NIZORAL   traZODone 50 MG tablet Commonly known as:  DESYREL     TAKE these medications   acetaminophen 325 MG tablet Commonly known as:  TYLENOL Take 2 tablets (650 mg total) by mouth every 6 (six) hours as needed for mild pain (or Fever >/= 101). What changed:    medication strength  how much to take  reasons to take this   allopurinol 100 MG tablet Commonly known as:  ZYLOPRIM Take 150 mg  by mouth daily.   apixaban 5 MG Tabs tablet Commonly known as:  ELIQUIS Take 1 tablet (5 mg total) by mouth 2 (two) times daily.   benzocaine-Menthol 20-0.5 % Aero Commonly known as:  DERMOPLAST Apply  1 application topically 4 (four) times daily as needed for irritation.   bisacodyl 10 MG suppository Commonly known as:  DULCOLAX Place 1 suppository (10 mg total) rectally daily as needed for moderate constipation.   busPIRone 10 MG tablet Commonly known as:  BUSPAR Take 10 mg by mouth 2 (two) times daily.   cholecalciferol 1000 units tablet Commonly known as:  VITAMIN D Take 1,000 Units by mouth daily.   colchicine 0.6 MG tablet Take 0.6 mg by mouth daily as needed (gout).   dibucaine 1 % Oint Commonly known as:  NUPERCAINAL Place 1 application rectally as needed for hemorrhoids.   diphenhydrAMINE 25 mg capsule Commonly known as:  BENADRYL Take 1 capsule (25 mg total) by mouth at bedtime.   docusate sodium 100 MG capsule Commonly known as:  COLACE Take 100 mg by mouth at bedtime.   ferrous sulfate 325 (65 FE) MG EC tablet Take 1 tablet (325 mg total) by mouth 2 (two) times daily.   furosemide 40 MG tablet Commonly known as:  LASIX Take 1 tablet (40 mg total) by mouth 2 (two) times daily. What changed:    medication strength  how much to take  when to take this   HYDROcodone-acetaminophen 5-325 MG tablet Commonly known as:  NORCO/VICODIN Take 1 tablet by mouth every 6 (six) hours as needed for moderate pain.   hydrocortisone 2.5 % cream Apply 1 application topically daily as needed (rash).   insulin aspart 100 UNIT/ML injection Commonly known as:  novoLOG Inject 4 Units into the skin 3 (three) times daily with meals.   insulin aspart 100 UNIT/ML injection Commonly known as:  novoLOG Inject 0-15 Units into the skin 3 (three) times daily with meals. CBG < 70: implement hypoglycemia protocol CBG 70 - 120: 0 units CBG 121 - 150: 2 units CBG 151 - 200: 3  units CBG 201 - 250: 5 units CBG 251 - 300: 8 units CBG 301 - 350: 11 units CBG 351 - 400: 15 units CBG > 400: call MD and obtain STAT lab verification   insulin aspart 100 UNIT/ML injection Commonly known as:  novoLOG Inject 0-5 Units into the skin at bedtime. CBG < 70: implement hypoglycemia protocol CBG 70 - 120: 0 units CBG 121 - 150: 0 units CBG 151 - 200: 0 units CBG 201 - 250: 2 units CBG 251 - 300: 3 units CBG 301 - 350: 4 units CBG 351 - 400: 5 units CBG > 400: call MD and obtain STAT lab verification   insulin glargine 100 UNIT/ML injection Commonly known as:  LANTUS Inject 0.18 mLs (18 Units total) into the skin daily. Start taking on:  04/24/2018   ipratropium-albuterol 0.5-2.5 (3) MG/3ML Soln Commonly known as:  DUONEB Take 3 mLs by nebulization 2 (two) times daily.   lidocaine 5 % Commonly known as:  LIDODERM Place 1 patch onto the skin daily. Remove & Discard patch within 12 hours or as directed by MD   metoprolol tartrate 25 MG tablet Commonly known as:  LOPRESSOR Take 1 tablet (25 mg total) by mouth 2 (two) times daily.   multivitamin with minerals Tabs tablet Take 1 tablet by mouth daily. Start taking on:  04/24/2018   neomycin-bacitracin-polymyxin ointment Commonly known as:  NEOSPORIN Apply topically as needed for wound care.   ondansetron 4 MG tablet Commonly known as:  ZOFRAN Take 1 tablet (4 mg total) by mouth every 6 (six) hours as needed for nausea.   oxybutynin 5 MG  tablet Commonly known as:  DITROPAN Take 0.5 tablets (2.5 mg total) by mouth at bedtime. What changed:    how much to take  when to take this   pantoprazole 40 MG tablet Commonly known as:  PROTONIX Take 1 tablet (40 mg total) by mouth 2 (two) times daily.   PARoxetine 20 MG tablet Commonly known as:  PAXIL Take 20 mg by mouth daily.   polyethylene glycol packet Commonly known as:  MIRALAX / GLYCOLAX Take 17 g by mouth daily. Start taking on:  04/24/2018    pravastatin 20 MG tablet Commonly known as:  PRAVACHOL Take 20 mg by mouth at bedtime.   protein supplement shake Liqd Commonly known as:  PREMIER PROTEIN Take 325 mLs (11 oz total) by mouth 2 (two) times daily between meals.   senna-docusate 8.6-50 MG tablet Commonly known as:  Senokot-S Take 1 tablet by mouth every morning. Start taking on:  04/24/2018   SYSTANE OP Apply 1 drop to eye 2 (two) times daily as needed (dry eyes).   ZEASORB powder Generic drug:  talc Apply 1 application topically as needed (under the breast irritation).        DISCHARGE INSTRUCTIONS:   Daily weight monitoring, intake and output Follow-up with primary care physician at the facility in 3 to 4 days Follow-up with gastroenterology Dr. Vicente Males in 2 weeks Follow-up with cardiology Dr. Fletcher Anon in 1 week Physical therapy  DIET:  Cardiac diet and Diabetic diet  DISCHARGE CONDITION:  Stable  ACTIVITY:  Activity as tolerated  OXYGEN:  Home Oxygen: No.   Oxygen Delivery: room air  DISCHARGE LOCATION:  nursing home   If you experience worsening of your admission symptoms, develop shortness of breath, life threatening emergency, suicidal or homicidal thoughts you must seek medical attention immediately by calling 911 or calling your MD immediately  if symptoms less severe.  You Must read complete instructions/literature along with all the possible adverse reactions/side effects for all the Medicines you take and that have been prescribed to you. Take any new Medicines after you have completely understood and accpet all the possible adverse reactions/side effects.   Please note  You were cared for by a hospitalist during your hospital stay. If you have any questions about your discharge medications or the care you received while you were in the hospital after you are discharged, you can call the unit and asked to speak with the hospitalist on call if the hospitalist that took care of you is not  available. Once you are discharged, your primary care physician will handle any further medical issues. Please note that NO REFILLS for any discharge medications will be authorized once you are discharged, as it is imperative that you return to your primary care physician (or establish a relationship with a primary care physician if you do not have one) for your aftercare needs so that they can reassess your need for medications and monitor your lab values.     Today  Chief Complaint  Patient presents with  . Hematemesis  . Abdominal Pain   Patient is feeling much better.  Denies any complaints.  Having bowel movements on a regular basis.  Heart rate is well controlled Okay to discharge patient from cardiology standpoint  ROS:  CONSTITUTIONAL: Denies fevers, chills. Denies any fatigue, weakness.  EYES: Denies blurry vision, double vision, eye pain. EARS, NOSE, THROAT: Denies tinnitus, ear pain, hearing loss. RESPIRATORY: Denies cough, wheeze, shortness of breath.  CARDIOVASCULAR: Denies chest pain, palpitations, edema.  GASTROINTESTINAL: Denies nausea, vomiting, diarrhea, abdominal pain. Denies bright red blood per rectum. GENITOURINARY: Denies dysuria, hematuria. ENDOCRINE: Denies nocturia or thyroid problems. HEMATOLOGIC AND LYMPHATIC: Denies easy bruising or bleeding. SKIN: Denies rash or lesion. MUSCULOSKELETAL: Denies pain in neck, back, shoulder, knees, hips or arthritic symptoms.  NEUROLOGIC: Denies paralysis, paresthesias.  PSYCHIATRIC: Denies anxiety or depressive symptoms.   VITAL SIGNS:  Blood pressure 98/85, pulse 83, temperature (!) 97.5 F (36.4 C), temperature source Oral, resp. rate 18, height 5\' 6"  (1.676 m), weight 115.9 kg, SpO2 95 %.  I/O:    Intake/Output Summary (Last 24 hours) at 04/23/2018 1154 Last data filed at 04/23/2018 1128 Gross per 24 hour  Intake 527.5 ml  Output 1600 ml  Net -1072.5 ml    PHYSICAL EXAMINATION:  GENERAL:  76 y.o.-year-old  patient lying in the bed with no acute distress.  EYES: Pupils equal, round, reactive to light and accommodation. No scleral icterus. Extraocular muscles intact.  HEENT: Head atraumatic, normocephalic. Oropharynx and nasopharynx clear.  NECK:  Supple, no jugular venous distention. No thyroid enlargement, no tenderness.  LUNGS: Normal breath sounds bilaterally, no wheezing, rales,rhonchi or crepitation. No use of accessory muscles of respiration.  CARDIOVASCULAR: S1, S2 normal. No murmurs, rubs, or gallops.  ABDOMEN: Soft, non-tender, non-distended. Bowel sounds present. No organomegaly or mass.  EXTREMITIES: No pedal edema, cyanosis, or clubbing.  NEUROLOGIC: Cranial nerves II through XII are intact. Muscle strength 5/5 in all extremities. Sensation intact. Gait not checked.  PSYCHIATRIC: The patient is alert and oriented x 3.  SKIN: No obvious rash, lesion, or ulcer.   DATA REVIEW:   CBC Recent Labs  Lab 04/21/18 0821  WBC 4.9  HGB 8.9*  HCT 26.6*  PLT 294    Chemistries  Recent Labs  Lab 04/20/18 1412  04/23/18 0339  NA 139   < > 137  K 3.8   < > 4.4  CL 88*   < > 88*  CO2 42*   < > 41*  GLUCOSE 275*   < > 278*  BUN 28*   < > 31*  CREATININE 1.64*   < > 1.54*  CALCIUM 8.1*   < > 8.5*  MG  --    < > 2.1  AST 30  --   --   ALT <5  --   --   ALKPHOS 124  --   --   BILITOT 1.0  --   --    < > = values in this interval not displayed.    Cardiac Enzymes No results for input(s): TROPONINI in the last 168 hours.  Microbiology Results  Results for orders placed or performed during the hospital encounter of 04/04/18  MRSA PCR Screening     Status: None   Collection Time: 04/06/18  9:05 AM  Result Value Ref Range Status   MRSA by PCR NEGATIVE NEGATIVE Final    Comment:        The GeneXpert MRSA Assay (FDA approved for NASAL specimens only), is one component of a comprehensive MRSA colonization surveillance program. It is not intended to diagnose MRSA infection nor  to guide or monitor treatment for MRSA infections. Performed at [facility] [address] [place], [place] [address]   Culture, respiratory (non-expectorated)     Status: None   Collection Time: 04/09/18  7:43 AM  Result Value Ref Range Status   Specimen Description   Final    TRACHEAL ASPIRATE Performed at [facility], [place]  [address], [place], [place] [address]    Special Requests   Final    NONE Performed at [facility], [place], [place] [address]    Gram Stain   Final    FEW WBC PRESENT,BOTH PMN AND MONONUCLEAR MODERATE GRAM POSITIVE COCCI FEW GRAM NEGATIVE RODS Performed at [facility], [place] [address] [place], [place] [address]    Culture MODERATE KLEBSIELLA PNEUMONIAE  Final   Report Status 04/11/2018 FINAL  Final   Organism ID, Bacteria KLEBSIELLA PNEUMONIAE  Final      Susceptibility   Klebsiella pneumoniae - MIC*    AMPICILLIN RESISTANT Resistant     CEFAZOLIN <=4 SENSITIVE Sensitive     CEFEPIME <=1 SENSITIVE Sensitive     CEFTAZIDIME <=1 SENSITIVE Sensitive     CEFTRIAXONE <=1 SENSITIVE Sensitive     CIPROFLOXACIN <=0.25 SENSITIVE Sensitive     GENTAMICIN <=1 SENSITIVE Sensitive     IMIPENEM <=0.25 SENSITIVE Sensitive     TRIMETH/SULFA <=20 SENSITIVE Sensitive     AMPICILLIN/SULBACTAM 8 SENSITIVE Sensitive     PIP/TAZO <=4 SENSITIVE Sensitive     Extended ESBL NEGATIVE Sensitive     * MODERATE KLEBSIELLA PNEUMONIAE  C difficile quick scan w PCR reflex     Status: None   Collection Time: 04/12/18  3:12 AM  Result Value Ref Range Status   C Diff antigen NEGATIVE NEGATIVE Final   C Diff toxin NEGATIVE NEGATIVE Final   C Diff interpretation No C. difficile detected.  Final    Comment: Performed at [facility], [address] [place], [place] [address]    RADIOLOGY:  Ct Hip Right Wo Contrast  Result Date: 04/22/2018 CLINICAL DATA:  Right hip pain. Unable to sleep last night because of the  pain. EXAM: CT OF THE RIGHT HIP WITHOUT CONTRAST TECHNIQUE: Multidetector CT imaging of the right hip was performed according to the standard protocol. Multiplanar CT image reconstructions were also generated. COMPARISON:  None. FINDINGS: Bones/Joint/Cartilage No fracture or dislocation. Normal alignment. No joint effusion. Mild-moderate osteoarthritis of the right hip with moderate posterior and mild superior joint space narrowing and marginal osteophytes. Degenerative changes of the pubic symphysis. Enthesopathic changes at the right hamstring origin. No periosteal reaction or bone destruction. Ligaments Ligaments are suboptimally evaluated by CT. Muscles and Tendons Muscles are normal. No intramuscular fluid collection or hematoma. No muscle atrophy. Soft tissue No fluid collection or hematoma.  No soft tissue mass. IMPRESSION: 1.  No acute osseous injury of the right hip. 2. Mild-moderate osteoarthritis of the right hip. Electronically Signed   By: Kathreen Devoid   On: 04/22/2018 13:58    EKG:   Orders placed or performed during the hospital encounter of 04/04/18  . EKG 12-Lead  . EKG 12-Lead      Management plans discussed with the patient, family and they are in agreement.  CODE STATUS:     Code Status Orders  (From admission, onward)         Start     Ordered   04/05/18 0330  Full code  Continuous     04/05/18 0329        Code Status History    Date Active Date Inactive Code Status Order ID Comments User Context   02/05/2018 0035 02/06/2018 2136 Full Code [id number]  Elwyn Reach, MD ED   08/28/2017 1121 08/30/2017 1422 Full Code [id number]  Deboraha Sprang, MD Inpatient   06/12/2017 [phone] 06/13/2017 1500 Full Code  [id number]  Quintella Baton, MD Inpatient   08/25/2016 0940 08/25/2016 1416 Full Code [id number]  Corey Skains, MD Inpatient   01/25/2016 0622 01/28/2016 1800 Full Code [id number]  Saundra Shelling, MD ED   07/22/2015 0107 07/25/2015 1609 Full Code [id number]  Harrie Foreman, MD Inpatient   07/01/2015 1605 07/03/2015 1659 Full Code [id number]  Isaias Cowman, MD Inpatient   06/29/2015 1751 07/01/2015 1605 Full Code [id number]  Fritzi Mandes, MD Inpatient   02/20/2015 2322 02/25/2015 1753 Full Code [id number]  Marlyce Huge, MD ED      TOTAL TIME TAKING CARE OF THIS PATIENT: 43 minutes.   Note: This dictation was prepared with Dragon dictation along with smaller phrase technology. Any transcriptional errors that result from this process are unintentional.   @[facility] @  on 04/23/2018 at 11:54 AM  Between 7am to 6pm - Pager - [phone]  After 6pm go to [email or web address] - password [email or web address] [facility]  [facility]  Office  [phone]  CC: Primary care physician; [name], MD

## 2018-04-23 NOTE — Progress Notes (Signed)
Pharmacy Electrolyte Monitoring Consult:  Pharmacy consulted to assist in monitoring and replacing electrolytes and glucose management in this 77 y.o. female admitted on 04/04/2018 with Hematemesis and Abdominal Pain  Pt on Lasix 60 mg IV every 8 hours now d/c'd 8/10. Lasix 40 mg IV BID restarted 8/10 later in day  Labs:  Sodium (mmol/L)  Date Value  04/23/2018 137  10/16/2014 141   Potassium (mmol/L)  Date Value  04/23/2018 4.4  10/16/2014 4.6   Magnesium (mg/dL)  Date Value  04/23/2018 2.1   Phosphorus (mg/dL)  Date Value  04/16/2018 4.3   Calcium (mg/dL)  Date Value  04/23/2018 8.5 (L)   Calcium, Total (mg/dL)  Date Value  10/16/2014 8.5   Albumin (g/dL)  Date Value  04/20/2018 2.5 (L)  10/29/2012 3.9    Goal potassium of 4.0, Mag of 2.0.   Assessment/Plan: K 4.4, Mag 2.1   Scr 1.54 Ordered Lasix 40 mg IV BID - will order KCl 20 mEq PO x1 as pt is on IV lasix.. Will continue to monitor BMP in AM for electrolyte abnormalities.  BG 218, 288, 278, 297. Will slightly increase Lantus to 15 units daily.  Pharmacy will continue to monitor and adjust per consult.   Chinita Greenland PharmD Clinical Pharmacist 04/23/2018

## 2018-04-23 NOTE — Discharge Instructions (Signed)
Daily weight monitoring, intake and output Follow-up with primary care physician at the facility in 3 to 4 days Follow-up with gastroenterology Dr. Vicente Males in 2 weeks Follow-up with cardiology Dr. Fletcher Anon in 1 week Physical therapy

## 2018-04-23 NOTE — Clinical Social Work Placement (Signed)
   CLINICAL SOCIAL WORK PLACEMENT  NOTE  Date:  04/23/2018  Patient Details  Name: Deborah Obrien MRN: [id number] Date of Birth: 1941/02/03  Clinical Social Work is seeking post-discharge placement for this patient at the [place] level of care (*CSW will initial, date and re-position this form in  chart as items are completed):  Yes   Patient/family provided with [facility] Work Department's list of facilities offering this level of care within the geographic area requested by the patient (or if unable, by the patient's family).  Yes   Patient/family informed of their freedom to choose among providers that offer the needed level of care, that participate in Medicare, Medicaid or managed care program needed by the patient, have an available bed and are willing to accept the patient.  Yes   Patient/family informed of [facility]'s ownership interest in [facility] and [facility], as well as of the fact that they are under no obligation to receive care at these facilities.  PASRR submitted to EDS on 04/17/18     PASRR number received on       Existing PASRR number confirmed on 04/17/18     FL2 transmitted to all facilities in geographic area requested by pt/family on 04/17/18     FL2 transmitted to all facilities within larger geographic area on       Patient informed that his/her managed care company has contracts with or will negotiate with certain facilities, including the following:        Yes   Patient/family informed of bed offers received.  Patient chooses bed at [facility]     Physician recommends and patient chooses bed at      Patient to be transferred to [facility] on 04/23/18.  Patient to be transferred to facility by [facility]     Patient family notified on 04/23/18 of transfer.  Name of family member notified:  Patient's family was at bedside.     PHYSICIAN Please sign FL2, Please prepare  prescriptions     Additional Comment:    _______________________________________________ Ross Ludwig, LCSWA 04/23/2018, 12:31 PM

## 2018-04-23 NOTE — Progress Notes (Signed)
Progress Note  Patient Name: Deborah Obrien Date of Encounter: 04/23/2018  Primary Cardiologist: Caryl Comes  Subjective   She reports significant improvement in shortness of breath.  It appears like she went back into atrial fibrillation overnight but ventricular rate is reasonably controlled in the 80s and 90s.  Blood pressure was running low over the weekend and metoprolol dose was decreased.  Inpatient Medications    Scheduled Meds: . apixaban  5 mg Oral BID  . busPIRone  10 mg Oral BID  . diphenhydrAMINE  25 mg Oral QHS  . furosemide  40 mg Oral BID  . insulin aspart  0-15 Units Subcutaneous TID WC  . insulin aspart  0-5 Units Subcutaneous QHS  . insulin aspart  4 Units Subcutaneous TID WC  . insulin glargine  15 Units Subcutaneous Daily  . ipratropium-albuterol  3 mL Nebulization BID  . lidocaine  1 patch Transdermal Q24H  . mouth rinse  15 mL Mouth Rinse BID  . metoprolol tartrate  25 mg Oral BID  . multivitamin with minerals  1 tablet Oral Daily  . pantoprazole  40 mg Oral BID AC  . PARoxetine  20 mg Oral Daily  . polyethylene glycol  17 g Oral Daily  . potassium chloride  20 mEq Oral Once  . protein supplement shake  11 oz Oral BID BM  . senna-docusate  1 tablet Oral q morning - 10a  . sodium chloride flush  10-40 mL Intracatheter Q12H  . traZODone  50 mg Oral QHS   Continuous Infusions:  PRN Meds: acetaminophen **OR** acetaminophen, benzocaine-Menthol, bisacodyl, dibucaine, HYDROcodone-acetaminophen, metoprolol tartrate, morphine injection, neomycin-bacitracin-polymyxin, ondansetron **OR** ondansetron (ZOFRAN) IV, phenol, polyvinyl alcohol, sodium chloride flush, witch hazel-glycerin   Vital Signs    Vitals:   04/22/18 2046 04/23/18 0316 04/23/18 0722 04/23/18 0816  BP:  (!) 115/56  98/85  Pulse:  (!) 59  83  Resp:      Temp:  97.7 F (36.5 C)    TempSrc:  Oral    SpO2: 96% 99% 100% 95%  Weight:  115.9 kg    Height:        Intake/Output Summary  (Last 24 hours) at 04/23/2018 0821 Last data filed at 04/23/2018 0329 Gross per 24 hour  Intake 527.5 ml  Output 1200 ml  Net -672.5 ml   Filed Weights   04/20/18 0431 04/21/18 0508 04/23/18 0316  Weight: 118.5 kg 116.9 kg 115.9 kg    Telemetry    Atrial paced converted to atrial fibrillation with heart rate in the 80s and 90s.- Personally Reviewed  ECG    None new- Personally Reviewed  Physical Exam   GEN: Well nourished, well developed, in no acute distress  HEENT: normal  Neck: no JVD, carotid bruits, or masses Cardiac: Irregularly irregular; no murmurs, rubs, or gallops, 1+ edema  Respiratory:  clear to auscultation bilaterally, normal work of breathing GI: soft, nontender, nondistended, + BS MS: no deformity or atrophy  Skin: warm and dry, device site well healed Neuro:  Strength and sensation are intact Psych: euthymic mood, full affect   Labs    Chemistry Recent Labs  Lab 04/18/18 0518 04/19/18 6759  04/20/18 1412 04/21/18 0821 04/22/18 0953 04/23/18 0339  NA 142 140   < > 139 138 140 137  K 3.5 3.9   < > 3.8 4.5 3.8 4.4  CL 96* 93*   < > 88* 88* 90* 88*  CO2 34* 36*   < > 42*  37* 39* 41*  GLUCOSE 311* 259*   < > 275* 347* 281* 278*  BUN 26* 26*   < > 28* 27* 29* 31*  CREATININE 1.59* 1.60*   < > 1.64* 1.65* 1.55* 1.54*  CALCIUM 8.0* 8.0*   < > 8.1* 8.3* 8.4* 8.5*  PROT 6.2* 6.1*  --  6.2*  --   --   --   ALBUMIN 2.4* 2.4*  --  2.5*  --   --   --   AST 25 25  --  30  --   --   --   ALT <5 <5  --  <5  --   --   --   ALKPHOS 134* 110  --  124  --   --   --   BILITOT 0.8 0.9  --  1.0  --   --   --   GFRNONAA 30* 30*   < > 29* 29* 31* 31*  GFRAA 35* 35*   < > 34* 33* 36* 36*  ANIONGAP 12 11   < > 9 13 11 8    < > = values in this interval not displayed.     Hematology Recent Labs  Lab 04/19/18 1002 04/20/18 0708 04/21/18 0821  WBC 5.3 5.4 4.9  RBC 2.90* 2.70* 2.86*  HGB 9.1* 8.7* 8.9*  HCT 27.3* 25.3* 26.6*  MCV 94.4 94.0 93.1  MCH 31.3 32.3  31.0  MCHC 33.1 34.3 33.3  RDW 16.1* 15.7* 15.6*  PLT 318 326 294    Cardiac EnzymesNo results for input(s): TROPONINI in the last 168 hours. No results for input(s): TROPIPOC in the last 168 hours.   BNPNo results for input(s): BNP, PROBNP in the last 168 hours.   DDimer No results for input(s): DDIMER in the last 168 hours.   Radiology    Dg Hip Unilat With Pelvis 2-3 Views Right  Result Date: 04/17/2018 IMPRESSION: Normal right hip. Electronically Signed   By: Marijo Conception, M.D.   On: 04/17/2018 15:44    Cardiac Studies   Echo 04/10/2018: Study Conclusions  - Left ventricle: The cavity size was normal. Wall thickness was normal. Systolic function was normal. The estimated ejection fraction was in the range of 60% to 65%. - Mitral valve: Moderately thickened, mildly calcified leaflets . - Left atrium: The atrium was mildly dilated. - Right ventricle: The cavity size was mildly to moderately dilated. Wall thickness was mildly increased. - Right atrium: The atrium was mildly dilated. - Tricuspid valve: There was moderate regurgitation. - Pulmonary arteries: Systolic pressure was moderately increased, estimated to be 50 mm Hg plus central venous/right atrial pressure.  Patient Profile     77 y.o. 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  Assessment & Plan    1. PAF with RVR: The patient went back into atrial fibrillation overnight but ventricular rate is reasonably controlled.  I elected to increase metoprolol to 25 mg twice daily. She is currently on Eliquis for anticoagulation.  If ventricular rate becomes uncontrolled, we might have to resume amiodarone to try to keep her in sinus rhythm. I am hoping that she will be able to be discharged on current dose of metoprolol 25 mg twice daily.  2. Acute respiratory failure with hypoxia and  hypercapnia: Multifactorial and improved.  3. 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  However, it might be best to diurese her gently with oral diuretics especially with underlying low albumin and third spacing. I switch furosemide to 40 mg by mouth twice daily.  4. Partial SBO:  Resolved.  5. Acute blood loss anemia/upper GI bleed: No further bloody bowel movements.  Continue with current management.  6. AKI:  Creatinine appears to be at baseline  7. Hypoalbuminemia: -Received IV albumin on 7/31 -Soft diet will help significantly  -Albumin trending up 2.3  2.4  8. Disp:  Possible discharge to rehab today as long as heart rate is controlled.    For questions or updates, please contact [place] Please consult [email or web address] for contact info under Cardiology/STEMI.    Signed, Kathlyn Sacramento, MD  [facility] 04/23/2018 8:21 AM

## 2018-04-23 NOTE — Care Management Important Message (Signed)
Copy of signed IM left with patient in room.  

## 2018-04-25 ENCOUNTER — Other Ambulatory Visit: Payer: Self-pay

## 2018-04-25 MED ORDER — HYDROCODONE-ACETAMINOPHEN 5-325 MG PO TABS: 1.0000 | ORAL_TABLET | Freq: Four times a day (QID) | ORAL | 0 refills | Status: AC | PRN

## 2018-04-25 NOTE — Telephone Encounter (Signed)
Rx sent to [facility] phone : [phone] , fax : [phone]  

## 2018-04-30 ENCOUNTER — Encounter: Payer: Self-pay | Admitting: Internal Medicine

## 2018-04-30 NOTE — Progress Notes (Signed)
Opened in error; Disregard.

## 2018-05-02 ENCOUNTER — Other Ambulatory Visit
Admission: RE | Admit: 2018-05-02 | Discharge: 2018-05-02 | Disposition: A | Discharge status: Home / Self Care | Payer: Medicare Other | Source: Ambulatory Visit | Attending: Internal Medicine | Admitting: Internal Medicine

## 2018-05-02 ENCOUNTER — Encounter: Payer: Self-pay | Admitting: Internal Medicine

## 2018-05-02 ENCOUNTER — Non-Acute Institutional Stay (SKILLED_NURSING_FACILITY): Payer: Medicare Other | Admitting: Internal Medicine

## 2018-05-02 DIAGNOSIS — I480 Paroxysmal atrial fibrillation: Secondary | ICD-10-CM | POA: Diagnosis not present

## 2018-05-02 DIAGNOSIS — E119 Type 2 diabetes mellitus without complications: Secondary | ICD-10-CM | POA: Diagnosis present

## 2018-05-02 DIAGNOSIS — E782 Mixed hyperlipidemia: Secondary | ICD-10-CM | POA: Diagnosis not present

## 2018-05-02 DIAGNOSIS — I10 Essential (primary) hypertension: Secondary | ICD-10-CM

## 2018-05-02 DIAGNOSIS — J9601 Acute respiratory failure with hypoxia: Secondary | ICD-10-CM

## 2018-05-02 DIAGNOSIS — E1149 Type 2 diabetes mellitus with other diabetic neurological complication: Secondary | ICD-10-CM | POA: Diagnosis not present

## 2018-05-02 DIAGNOSIS — J9602 Acute respiratory failure with hypercapnia: Secondary | ICD-10-CM

## 2018-05-02 DIAGNOSIS — K317 Polyp of stomach and duodenum: Secondary | ICD-10-CM | POA: Diagnosis not present

## 2018-05-02 DIAGNOSIS — E1129 Type 2 diabetes mellitus with other diabetic kidney complication: Secondary | ICD-10-CM | POA: Insufficient documentation

## 2018-05-02 HISTORY — DX: Type 2 diabetes mellitus with other diabetic neurological complication: E11.49

## 2018-05-02 LAB — COMPREHENSIVE METABOLIC PANEL
ALK PHOS: 163 U/L — AB (ref 38–126)
ALT: 14 U/L (ref 0–44)
ANION GAP: 11 (ref 5–15)
AST: 38 U/L (ref 15–41)
Albumin: 3.1 g/dL — ABNORMAL LOW (ref 3.5–5.0)
BILIRUBIN TOTAL: 1.3 mg/dL — AB (ref 0.3–1.2)
BUN: 19 mg/dL (ref 8–23)
CALCIUM: 8.9 mg/dL (ref 8.9–10.3)
CO2: 36 mmol/L — ABNORMAL HIGH (ref 22–32)
Chloride: 90 mmol/L — ABNORMAL LOW (ref 98–111)
Creatinine, Ser: 1.53 mg/dL — ABNORMAL HIGH (ref 0.44–1.00)
GFR, EST AFRICAN AMERICAN: 37 mL/min — AB (ref >60)
GFR, EST NON AFRICAN AMERICAN: 32 mL/min — AB (ref >60)
Glucose, Bld: 296 mg/dL — ABNORMAL HIGH (ref 70–99)
Potassium: 3.8 mmol/L (ref 3.5–5.1)
Sodium: 137 mmol/L (ref 135–145)
TOTAL PROTEIN: 7.4 g/dL (ref 6.5–8.1)

## 2018-05-02 LAB — CBC WITH DIFFERENTIAL/PLATELET
Basophils Absolute: 0 10*3/uL (ref 0–0.1)
Basophils Relative: 1 %
Eosinophils Absolute: 0.1 10*3/uL (ref 0–0.7)
Eosinophils Relative: 3 %
HEMATOCRIT: 35.2 % (ref 35.0–47.0)
HEMOGLOBIN: 11.8 g/dL — AB (ref 12.0–16.0)
LYMPHS ABS: 1.2 10*3/uL (ref 1.0–3.6)
Lymphocytes Relative: 27 %
MCH: 31.2 pg (ref 26.0–34.0)
MCHC: 33.6 g/dL (ref 32.0–36.0)
MCV: 92.9 fL (ref 80.0–100.0)
MONOS PCT: 14 %
Monocytes Absolute: 0.6 10*3/uL (ref 0.2–0.9)
NEUTROS ABS: 2.4 10*3/uL (ref 1.4–6.5)
NEUTROS PCT: 55 %
Platelets: 308 10*3/uL (ref 150–440)
RBC: 3.79 MIL/uL — ABNORMAL LOW (ref 3.80–5.20)
RDW: 16.5 % — ABNORMAL HIGH (ref 11.5–14.5)
WBC: 4.4 10*3/uL (ref 3.6–11.0)

## 2018-05-02 NOTE — Progress Notes (Signed)
[facility]:  [facility] Room Number: [place] of Service:  SNF [phone])  Provider: Dr. Veleta Miners  PCP: [name], MD Patient Care Team: [name], MD as PCP - General (Internal Medicine) [name], MD (Internal Medicine) Bary Castilla Forest Gleason, MD (General Surgery)  Extended Emergency Contact Information Primary Emergency Contact: Evlyn Kanner Address: [name]          [place], [place] [address] Johnnette Litter of [place] Phone: [phone] Mobile Phone: [phone] Relation: Spouse Secondary Emergency Contact: Tami Ribas Address: [address]          [place], [place] [address] Johnnette Litter of [place] Phone: [phone] Relation: Son  Code Status: FULL Goals of care:  Advanced Directive information Advanced Directives 05/02/2018  Does Patient Have a Medical Advance Directive? No  Type of Advance Directive -  Does patient want to make changes to medical advance directive? -  Copy of [place] in Chart? -  Would patient like information on creating a medical advance directive? No - Patient declined     Allergies  Allergen Reactions  . Cephalosporins Other (See Comments)    Reaction:  Unknown   . Fish Allergy Shortness Of Breath  . Macrolides And Ketolides Other (See Comments)    Reaction:  Unknown  Other Reaction: Intolerance Other Reaction: Intolerance   . Meperidine Shortness Of Breath, Nausea Only and Other (See Comments)    Reaction:  Stomach pain   . Other Nausea Only, Rash and Other (See Comments)    Uncoded Allergy. Allergen: NON-STEROIDS, Other Reaction: Not Assessed bextra - hands and feet swell Shellfish - SOB Shellfish - SOB Other Reaction: Not Assessed  . Prednisone Anaphylaxis and Other (See Comments)    Reaction:  Increases pts blood sugar  Pt states that she is allergic to all steroids.    . Shellfish Allergy Anaphylaxis, Shortness Of Breath, Diarrhea, Nausea  Only and Rash    Reaction:  Stomach pain  Other reaction(s): Other (See Comments) Reaction:Stomach pain   . Sulfa Antibiotics Shortness Of Breath, Diarrhea, Nausea Only and Other (See Comments)    Reaction:  Stomach pain  Reaction:Stomach pain  Other Reaction: Intolerance   . Sulfacetamide Sodium Diarrhea, Nausea Only, Other (See Comments) and Shortness Of Breath    Reaction:  Stomach pain   . Telbivudine     Other reaction(s): Other (See Comments) Reaction:Unknown   . Uloric [Febuxostat] Anaphylaxis    Locks pt's body up   . Aspirin Other (See Comments)    Reaction:  Stomach pain   . Celecoxib Other (See Comments)    Reaction:  GI bleed, weakness, and stomach pain.    . Cephalexin Diarrhea, Nausea Only and Other (See Comments)    Reaction:  Stomach pain   . Erythromycin Diarrhea, Nausea Only and Other (See Comments)    Reaction:  Stomach pain   . Hydromorphone Other (See Comments)    Reaction:  Hypotension   . Iodinated Diagnostic Agents Rash and Other (See Comments)    Pt states that she is unable to have because she has chronic kidney disease.    Marland Kitchen Oxycodone Other (See Comments)    Reaction:  Stomach pain   . Atorvastatin Nausea Only and Other (See Comments)    Reaction:  Weakness   . Codeine Diarrhea, Nausea Only and Other (See Comments)    Reaction:  Stomach pain Pt tolerates morphine   . Doxycycline     Stomach  pain   . Tape Other (See Comments)    Reaction:  Causes pts skin to tear  Pt states that she is able to use paper tape.      . Valdecoxib Swelling and Other (See Comments)    Pt states that her hands and feet swell.    . Iodine Rash    Chief Complaint  Patient presents with  . Discharge Note    Discharging from SNF on 05/04/2018    HPI:  77 y.o. female  For discharge from the facility. Patient was admitted to the facility for Therapy after staying in the hospital from 07/24-08/12 for SBO and GI bleed with CHF  Patient has h/o PAF on Chronic  Anticoagulation, S/P PCP, Diabetes Mellitus type 2, Obesity, CAD, Diastolic CHF, Hypertension,  Patient was admitted with abdominal pain nausea and ground emesis.  The CT scan showed small bowel obstruction . Her EGD showed Bleeding Gastric Polyp needing Injection. SBO was treated conservatively and her Pain resolved.  She also was in CHF and was diuresed and responded weill. Her Lasix dose was increased. She did go into respiratory failure requiring Intubation. Thought to be due to aspiration Pneumonia and HCF. She was treated with Antibiotics and Diuresis. She finally was extubated on 08/02 Foe her Atrial fibrillation she was restarted on Eliquis and she was on Amiodarone drip which was eventually stopped. She is now only on Metoprolol She was discharged to facility for therapy. She has done well here. Is walking with the walker . Still requiring Oxygen. Gets SOB with exertion. Denies any chest pain or cough. Swelling improved. Going home with her husband  She is going ho  Past Medical History:  Diagnosis Date  . A-fib ([place]) 2016  . Acid reflux   . Anxiety   . Bowel obstruction ([place])   . CHF (congestive heart failure) ([place]) 2017  . Chronic kidney disease (CKD) stage G3a/A1, moderately decreased glomerular filtration rate (GFR) between 45-59 mL/min/1.73 square meter and albuminuria creatinine ratio less than 30 mg/g (HCC)   . Diabetes mellitus without complication ([place])   . Dyspnea   . Fatty liver   . GI bleed   . Gout   . Hypertension   . Morbid obesity ([place])   . Osteoarthritis   . Osteoporosis   . Paroxysmal atrial fibrillation (HCC)   . Presence of permanent cardiac pacemaker   . Sinus node dysfunction ([place]) 08/24/2017  . TIA (transient ischemic attack)     Past Surgical History:  Procedure Laterality Date  . ABDOMINAL HYSTERECTOMY  1980  . APPENDECTOMY    . BACK SURGERY     2008  . BREAST BIOPSY Right 08/16   stereo fibroadenomatous change, neg for atypia  . BREAST  EXCISIONAL BIOPSY Left    neg  . CARDIAC CATHETERIZATION Left 08/25/2016   Procedure: Left Heart Cath and Coronary Angiography;  Surgeon: Corey Skains, MD;  Location: [facility];  Service: Cardiovascular;  Laterality: Left;  . CARDIOVERSION N/A 06/28/2017   Procedure: CARDIOVERSION;  Surgeon: Corey Skains, MD;  Location: [facility];  Service: Cardiovascular;  Laterality: N/A;  . CARDIOVERSION N/A 02/06/2018   Procedure: CARDIOVERSION;  Surgeon: Josue Hector, MD;  Location: [facility];  Service: Cardiovascular;  Laterality: N/A;  . CATARACT EXTRACTION, BILATERAL  2012  . CHOLECYSTECTOMY    . colon blockage  1999  . COLON SURGERY  1999  . COLONOSCOPY  2016   polyps removed 2016  . DORSAL COMPARTMENT RELEASE  Left 09/14/2016   Procedure: RELEASE DORSAL COMPARTMENT (DEQUERVAIN);  Surgeon: Dereck Leep, MD;  Location: [facility];  Service: Orthopedics;  Laterality: Left;  . ESOPHAGOGASTRODUODENOSCOPY N/A 01/27/2015   Procedure: ESOPHAGOGASTRODUODENOSCOPY (EGD);  Surgeon: Lollie Sails, MD;  Location: [facility] ENDOSCOPY;  Service: Endoscopy;  Laterality: N/A;  . ESOPHAGOGASTRODUODENOSCOPY (EGD) WITH PROPOFOL N/A 07/24/2015   Procedure: ESOPHAGOGASTRODUODENOSCOPY (EGD) WITH PROPOFOL;  Surgeon: Lucilla Lame, MD;  Location: [facility] ENDOSCOPY;  Service: Endoscopy;  Laterality: N/A;  . ESOPHAGOGASTRODUODENOSCOPY (EGD) WITH PROPOFOL N/A 04/12/2018   Procedure: ESOPHAGOGASTRODUODENOSCOPY (EGD) WITH PROPOFOL;  Surgeon: Jonathon Bellows, MD;  Location: [facility] ENDOSCOPY;  Service: Gastroenterology;  Laterality: N/A;  . JOINT REPLACEMENT  2014   Bilateral Knee replacement  . LEAD REVISION/REPAIR N/A 08/29/2017   Procedure: LEAD REVISION/REPAIR;  Surgeon: Constance Haw, MD;  Location: [facility];  Service: Cardiovascular;  Laterality: N/A;  . PACEMAKER INSERTION Left 07/01/2015   Procedure: INSERTION PACEMAKER;  Surgeon: Isaias Cowman, MD;  Location: [facility];  Service:  Cardiovascular;  Laterality: Left;  . PACEMAKER REVISION N/A 08/28/2017   Procedure: PACEMAKER REVISION;  Surgeon: Deboraha Sprang, MD;  Location: [facility];  Service: Cardiovascular;  Laterality: N/A;  . REPLACEMENT TOTAL KNEE BILATERAL    . SHOULDER ARTHROSCOPY WITH SUBACROMIAL DECOMPRESSION Left 2013  . TEE WITHOUT CARDIOVERSION N/A 06/28/2017   Procedure: TRANSESOPHAGEAL ECHOCARDIOGRAM (TEE);  Surgeon: Corey Skains, MD;  Location: [facility];  Service: Cardiovascular;  Laterality: N/A;      reports that she has never smoked. She has never used smokeless tobacco. She reports that she does not drink alcohol or use drugs. Social History   Socioeconomic History  . Marital status: Married    Spouse name: Not on file  . Number of children: Not on file  . Years of education: Not on file  . Highest education level: Not on file  Occupational History  . Occupation: retired  Scientific laboratory technician  . Financial resource strain: Not on file  . Food insecurity:    Worry: Not on file    Inability: Not on file  . Transportation needs:    Medical: Not on file    Non-medical: Not on file  Tobacco Use  . Smoking status: Never Smoker  . Smokeless tobacco: Never Used  Substance and Sexual Activity  . Alcohol use: No  . Drug use: No  . Sexual activity: Yes  Lifestyle  . Physical activity:    Days per week: Not on file    Minutes per session: Not on file  . Stress: Not on file  Relationships  . Social connections:    Talks on phone: Not on file    Gets together: Not on file    Attends religious service: Not on file    Active member of club or organization: Not on file    Attends meetings of clubs or organizations: Not on file    Relationship status: Not on file  . Intimate partner violence:    Fear of current or ex partner: Not on file    Emotionally abused: Not on file    Physically abused: Not on file    Forced sexual activity: Not on file  Other Topics Concern  . Not on file    Social History Narrative  . Not on file   Functional Status Survey:    Allergies  Allergen Reactions  . Cephalosporins Other (See Comments)    Reaction:  Unknown   . Fish Allergy Shortness Of  Breath  . Macrolides And Ketolides Other (See Comments)    Reaction:  Unknown  Other Reaction: Intolerance Other Reaction: Intolerance   . Meperidine Shortness Of Breath, Nausea Only and Other (See Comments)    Reaction:  Stomach pain   . Other Nausea Only, Rash and Other (See Comments)    Uncoded Allergy. Allergen: NON-STEROIDS, Other Reaction: Not Assessed bextra - hands and feet swell Shellfish - SOB Shellfish - SOB Other Reaction: Not Assessed  . Prednisone Anaphylaxis and Other (See Comments)    Reaction:  Increases pts blood sugar  Pt states that she is allergic to all steroids.    . Shellfish Allergy Anaphylaxis, Shortness Of Breath, Diarrhea, Nausea Only and Rash    Reaction:  Stomach pain  Other reaction(s): Other (See Comments) Reaction:Stomach pain   . Sulfa Antibiotics Shortness Of Breath, Diarrhea, Nausea Only and Other (See Comments)    Reaction:  Stomach pain  Reaction:Stomach pain  Other Reaction: Intolerance   . Sulfacetamide Sodium Diarrhea, Nausea Only, Other (See Comments) and Shortness Of Breath    Reaction:  Stomach pain   . Telbivudine     Other reaction(s): Other (See Comments) Reaction:Unknown   . Uloric [Febuxostat] Anaphylaxis    Locks pt's body up   . Aspirin Other (See Comments)    Reaction:  Stomach pain   . Celecoxib Other (See Comments)    Reaction:  GI bleed, weakness, and stomach pain.    . Cephalexin Diarrhea, Nausea Only and Other (See Comments)    Reaction:  Stomach pain   . Erythromycin Diarrhea, Nausea Only and Other (See Comments)    Reaction:  Stomach pain   . Hydromorphone Other (See Comments)    Reaction:  Hypotension   . Iodinated Diagnostic Agents Rash and Other (See Comments)    Pt states that she is unable to have  because she has chronic kidney disease.    Marland Kitchen Oxycodone Other (See Comments)    Reaction:  Stomach pain   . Atorvastatin Nausea Only and Other (See Comments)    Reaction:  Weakness   . Codeine Diarrhea, Nausea Only and Other (See Comments)    Reaction:  Stomach pain Pt tolerates morphine   . Doxycycline     Stomach pain   . Tape Other (See Comments)    Reaction:  Causes pts skin to tear  Pt states that she is able to use paper tape.      . Valdecoxib Swelling and Other (See Comments)    Pt states that her hands and feet swell.    . Iodine Rash    Pertinent  Health Maintenance Due  Topic Date Due  . FOOT EXAM  08/28/1951  . OPHTHALMOLOGY EXAM  08/28/1951  . URINE MICROALBUMIN  08/28/1951  . DEXA SCAN  08/28/2005  . INFLUENZA VACCINE  04/12/2018  . HEMOGLOBIN A1C  08/08/2018  . PNA vac Low Risk Adult  Completed    Medications: Outpatient Encounter Medications as of 05/02/2018  Medication Sig  . acetaminophen (TYLENOL) 325 MG tablet Take 2 tablets (650 mg total) by mouth every 6 (six) hours as needed for mild pain (or Fever >/= 101).  Marland Kitchen allopurinol (ZYLOPRIM) 100 MG tablet Take 150 mg by mouth daily.   Marland Kitchen apixaban (ELIQUIS) 5 MG TABS tablet Take 1 tablet (5 mg total) by mouth 2 (two) times daily.  . benzocaine-Menthol (DERMOPLAST) 20-0.5 % AERO Apply 1 application topically 4 (four) times daily as needed for irritation.  . bisacodyl (DULCOLAX)  10 MG suppository Place 1 suppository (10 mg total) rectally daily as needed for moderate constipation.  . busPIRone (BUSPAR) 10 MG tablet Take 10 mg by mouth 2 (two) times daily.   . cholecalciferol (VITAMIN D) 1000 units tablet Take 1,000 Units by mouth daily.  . colchicine 0.6 MG tablet Take 0.6 mg by mouth daily as needed (gout).   . dibucaine (NUPERCAINAL) 1 % OINT Place 1 application rectally as needed for hemorrhoids.  . diphenhydrAMINE (BENADRYL) 25 mg capsule Take 1 capsule (25 mg total) by mouth at bedtime.  . docusate sodium  (COLACE) 100 MG capsule Take 100 mg by mouth at bedtime.   . ferrous sulfate 325 (65 FE) MG EC tablet Take 1 tablet (325 mg total) by mouth 2 (two) times daily.  . furosemide (LASIX) 40 MG tablet Take 1 tablet (40 mg total) by mouth 2 (two) times daily.  Marland Kitchen HYDROcodone-acetaminophen (NORCO/VICODIN) 5-325 MG tablet Take 1 tablet by mouth every 6 (six) hours as needed for moderate pain.  . hydrocortisone 2.5 % cream Apply 1 application topically daily as needed (rash).  . insulin NPH-regular Human (NOVOLIN 70/30) (70-30) 100 UNIT/ML injection Inject 22 Units into the skin 2 (two) times daily.   Marland Kitchen ipratropium-albuterol (DUONEB) 0.5-2.5 (3) MG/3ML SOLN Take 3 mLs by nebulization 2 (two) times daily.  Marland Kitchen lidocaine (LIDODERM) 5 % Place 1 patch onto the skin daily. Remove & Discard patch within 12 hours or as directed by MD  . metoprolol tartrate (LOPRESSOR) 25 MG tablet Take 1 tablet (25 mg total) by mouth 2 (two) times daily.  . Multiple Vitamin (MULTIVITAMIN WITH MINERALS) TABS tablet Take 1 tablet by mouth daily.  Marland Kitchen neomycin-bacitracin-polymyxin (NEOSPORIN) ointment Apply topically as needed for wound care.  . NON FORMULARY Diet Type: Mechanical Soft Chopped  . ondansetron (ZOFRAN) 4 MG tablet Take 1 tablet (4 mg total) by mouth every 6 (six) hours as needed for nausea.  Marland Kitchen oxybutynin (DITROPAN-XL) 5 MG 24 hr tablet Take 2.5 mg by mouth at bedtime.  . OXYGEN Inhale 2 L into the lungs continuous.  . pantoprazole (PROTONIX) 40 MG tablet Take 1 tablet (40 mg total) by mouth 2 (two) times daily.  Marland Kitchen PARoxetine (PAXIL) 40 MG tablet Take 40 mg by mouth every morning.  Vladimir Faster Glycol-Propyl Glycol (SYSTANE) 0.4-0.3 % SOLN Place 1 drop into both eyes 2 (two) times daily as needed.  . polyethylene glycol (MIRALAX / GLYCOLAX) packet Take 17 g by mouth daily.  . pravastatin (PRAVACHOL) 20 MG tablet Take 20 mg by mouth at bedtime.   . senna-docusate (SENOKOT-S) 8.6-50 MG tablet Take 1 tablet by mouth every  morning.  . talc (ZEASORB) powder Apply 1 application topically as needed (under the breast irritation).  . [DISCONTINUED] benzocaine-Menthol (DERMOPLAST) 20-0.5 % AERO Apply 1 application topically daily as needed for irritation.  . [DISCONTINUED] insulin glargine (LANTUS) 100 UNIT/ML injection Inject 0.18 mLs (18 Units total) into the skin daily.   No facility-administered encounter medications on file as of 05/02/2018.     Review of Systems  Constitutional: Negative.   HENT: Negative.   Respiratory: Positive for cough and shortness of breath.   Cardiovascular: Negative.   Gastrointestinal: Negative.   Genitourinary: Negative.   Musculoskeletal: Negative.   Skin: Negative.   Neurological: Negative.   Psychiatric/Behavioral: Negative.     Vitals:   05/02/18 1020  BP: (!) 104/48  Pulse: 88  Resp: 18  Temp: 97.8 F (36.6 C)  TempSrc: Oral  SpO2: 98%  Weight: 234 lb 4.8 oz (106.3 kg)  Height: 5\' 6"  (1.676 m)   Body mass index is 37.82 kg/m. Physical Exam  Constitutional: She is oriented to person, place, and time. She appears well-developed and well-nourished.  HENT:  Head: Normocephalic.  Mouth/Throat: Oropharynx is clear and moist.  Eyes: Pupils are equal, round, and reactive to light.  Neck: Neck supple.  Cardiovascular: Normal rate and regular rhythm.  No murmur heard. Pulmonary/Chest: Effort normal and breath sounds normal. No stridor. No respiratory distress. She has no wheezes.  Abdominal: Soft. Bowel sounds are normal. She exhibits no distension. There is no tenderness. There is no guarding.  Musculoskeletal:  Mild edema Bilateral  Neurological: She is alert and oriented to person, place, and time.  Skin: Skin is warm and dry.  Psychiatric: She has a normal mood and affect. Her behavior is normal.    Labs reviewed: Basic Metabolic Panel: Recent Labs    04/14/18 0906 04/15/18 0638 04/16/18 0634  04/21/18 0821 04/22/18 0953 04/23/18 0339  NA 142 143  144   < > 138 140 137  K 3.7 3.8 4.9   < > 4.5 3.8 4.4  CL 107 108 107   < > 88* 90* 88*  CO2 26 25 21*   < > 37* 39* 41*  GLUCOSE 252* 131* 204*   < > 347* 281* 278*  BUN 44* 33* 33*   < > 27* 29* 31*  CREATININE 1.49* 1.51* 1.63*   < > 1.65* 1.55* 1.54*  CALCIUM 7.9* 8.0* 8.3*   < > 8.3* 8.4* 8.5*  MG 2.2 2.1 2.0   < > 1.8 1.7 2.1  PHOS 3.4 3.8 4.3  --   --   --   --    < > = values in this interval not displayed.   Liver Function Tests: Recent Labs    04/18/18 0518 04/19/18 0613 04/20/18 1412  AST 25 25 30   ALT <5 <5 <5  ALKPHOS 134* 110 124  BILITOT 0.8 0.9 1.0  PROT 6.2* 6.1* 6.2*  ALBUMIN 2.4* 2.4* 2.5*   Recent Labs    12/29/17 1358 04/04/18 2027  LIPASE 26 25   No results for input(s): AMMONIA in the last 8760 hours. CBC: Recent Labs    04/05/18 0924  04/12/18 0129  04/15/18 0736  04/19/18 1002 04/20/18 0708 04/21/18 0821  WBC 8.3   < > 8.5   < > 10.7   < > 5.3 5.4 4.9  NEUTROABS 7.2*  --  6.0  --  8.9*  --   --   --   --   HGB 9.6*   < > 8.9*   < > 9.5*   < > 9.1* 8.7* 8.9*  HCT 29.1*   < > 26.7*   < > 27.4*   < > 27.3* 25.3* 26.6*  MCV 100.0   < > 98.0   < > 95.7   < > 94.4 94.0 93.1  PLT 176   < > 142*   < > 177   < > 318 326 294   < > = values in this interval not displayed.   Cardiac Enzymes: Recent Labs    04/09/18 0251 04/09/18 0951 04/09/18 1458  TROPONINI 0.08* 0.10* 0.09*   BNP: Invalid input(s): POCBNP CBG: Recent Labs    04/22/18 2034 04/23/18 0730 04/23/18 1151  GLUCAP 288* 297* 403*    Procedures and Imaging Studies During Stay: Ct Abdomen Pelvis Wo Contrast  Result Date: 04/04/2018  CLINICAL DATA:  Abdominal pain and vomiting. History of TIA and on Eliquis. History of appendectomy, cholecystectomy, and hysterectomy. EXAM: CT ABDOMEN AND PELVIS WITHOUT CONTRAST TECHNIQUE: Multidetector CT imaging of the abdomen and pelvis was performed following the standard protocol without IV contrast. COMPARISON:  01/25/2016 FINDINGS:  Lower chest: Lung bases are clear. Cardiac enlargement. Coronary artery calcifications. Hepatobiliary: No focal liver abnormality is seen. Status post cholecystectomy. No biliary dilatation. Pancreas: Unremarkable. No pancreatic ductal dilatation or surrounding inflammatory changes. Spleen: Normal in size without focal abnormality. Adrenals/Urinary Tract: Adrenal glands are unremarkable. Kidneys are normal, without renal calculi, focal lesion, or hydronephrosis. Bladder is unremarkable. Stomach/Bowel: Stomach is moderately distended with what appears to be ingested material. No gastric wall thickening. Mid and distal small bowel are moderately dilated and fluid-filled. No wall thickening is appreciated. Distal small bowel are decompressed with transition zone likely in the mid pelvis. Changes are likely to represent small bowel obstruction. Cause is not determined. Scattered stool throughout the colon. No colonic wall thickening or infiltrative changes. Appendix is surgically absent. Anastomosis at the rectosigmoid junction appears patent. Surgical clips in the pelvis. Vascular/Lymphatic: Aortic atherosclerosis. No enlarged abdominal or pelvic lymph nodes. Reproductive: Status post hysterectomy. No adnexal masses. Other: No abdominal wall hernia or abnormality. No abdominopelvic ascites. Musculoskeletal: Degenerative changes in the spine. Postoperative change with posterior rod and screw fixation from L4 to the sacrum. IMPRESSION: Dilated fluid-filled small bowel with transition zone in the mid pelvis consistent with small bowel obstruction. Cause is not identified. Aortic atherosclerosis. Electronically Signed   By: Lucienne Capers M.D.   On: 04/04/2018 21:33   Dg Abd 1 View  Result Date: 04/08/2018 CLINICAL DATA:  Nasogastric tube placement. EXAM: ABDOMEN - 1 VIEW COMPARISON:  Abdominal radiograph April 07, 2018. FINDINGS: Nasogastric tube tip projects in gastric antrum. Included bowel gas pattern is  nondilated and nonobstructive. Surgical clips in the included right abdomen compatible with cholecystectomy. Surgical clips in the pelvis. Lumbar spine hardware. Cardiomegaly and cardiac defibrillator. Pleural effusions. Soft tissue planes and included osseous structures are non suspicious. IMPRESSION: Nasogastric tube tip projecting gastric antrum. Normal bowel gas pattern. Electronically Signed   By: Elon Alas M.D.   On: 04/08/2018 04:13   Dg Abd 1 View  Result Date: 04/05/2018 CLINICAL DATA:  Nasogastric tube placement. EXAM: ABDOMEN - 1 VIEW COMPARISON:  04/04/2018 FINDINGS: An enteric tube terminates in the region of the distal gastric body, more distal than on the prior study. The lower abdomen and pelvis were not imaged. No dilated loops of bowel are seen in the upper abdomen. Pacemaker leads are noted. Evaluation of the lung bases is limited by over penetration. There are surgical clips in the right upper abdomen. IMPRESSION: Enteric tube in the distal stomach. Electronically Signed   By: Logan Bores M.D.   On: 04/05/2018 10:10   Dg Abdomen 1 View  Result Date: 04/04/2018 CLINICAL DATA:  NG tube placement EXAM: ABDOMEN - 1 VIEW COMPARISON:  None. FINDINGS: Enteric tube tip in the left upper quadrant consistent with location in the body of the stomach. Mildly distended stomach. Mild cardiac enlargement. IMPRESSION: Enteric tube tip in the left upper quadrant consistent with location in the body of the stomach. Electronically Signed   By: Lucienne Capers M.D.   On: 04/04/2018 23:04   Ct Hip Right Wo Contrast  Result Date: 04/22/2018 CLINICAL DATA:  Right hip pain. Unable to sleep last night because of the pain. EXAM: CT OF THE RIGHT HIP WITHOUT  CONTRAST TECHNIQUE: Multidetector CT imaging of the right hip was performed according to the standard protocol. Multiplanar CT image reconstructions were also generated. COMPARISON:  None. FINDINGS: Bones/Joint/Cartilage No fracture or dislocation.  Normal alignment. No joint effusion. Mild-moderate osteoarthritis of the right hip with moderate posterior and mild superior joint space narrowing and marginal osteophytes. Degenerative changes of the pubic symphysis. Enthesopathic changes at the right hamstring origin. No periosteal reaction or bone destruction. Ligaments Ligaments are suboptimally evaluated by CT. Muscles and Tendons Muscles are normal. No intramuscular fluid collection or hematoma. No muscle atrophy. Soft tissue No fluid collection or hematoma.  No soft tissue mass. IMPRESSION: 1.  No acute osseous injury of the right hip. 2. Mild-moderate osteoarthritis of the right hip. Electronically Signed   By: Kathreen Devoid   On: 04/22/2018 13:58   US Venous Img Lower Bilateral  Result Date: 04/12/2018 CLINICAL DATA:  77 year old female with bilateral lower extremity edema for the past 3 days. EXAM: BILATERAL LOWER EXTREMITY VENOUS DOPPLER ULTRASOUND TECHNIQUE: Gray-scale sonography with graded compression, as well as color Doppler and duplex ultrasound were performed to evaluate the lower extremity deep venous systems from the level of the common femoral vein and including the common femoral, femoral, profunda femoral, popliteal and calf veins including the posterior tibial, peroneal and gastrocnemius veins when visible. The superficial great saphenous vein was also interrogated. Spectral Doppler was utilized to evaluate flow at rest and with distal augmentation maneuvers in the common femoral, femoral and popliteal veins. COMPARISON:  None. FINDINGS: RIGHT LOWER EXTREMITY Common Femoral Vein: No evidence of thrombus. Normal compressibility, respiratory phasicity and response to augmentation. Saphenofemoral Junction: No evidence of thrombus. Normal compressibility and flow on color Doppler imaging. Profunda Femoral Vein: No evidence of thrombus. Normal compressibility and flow on color Doppler imaging. Femoral Vein: No evidence of thrombus. Normal  compressibility, respiratory phasicity and response to augmentation. Popliteal Vein: No evidence of thrombus. Normal compressibility, respiratory phasicity and response to augmentation. Calf Veins: No evidence of thrombus. Normal compressibility and flow on color Doppler imaging. Superficial Great Saphenous Vein: No evidence of thrombus. Normal compressibility. Venous Reflux:  None. Other Findings:  None. LEFT LOWER EXTREMITY Common Femoral Vein: No evidence of thrombus. Normal compressibility, respiratory phasicity and response to augmentation. Saphenofemoral Junction: No evidence of thrombus. Normal compressibility and flow on color Doppler imaging. Profunda Femoral Vein: No evidence of thrombus. Normal compressibility and flow on color Doppler imaging. Femoral Vein: No evidence of thrombus. Normal compressibility, respiratory phasicity and response to augmentation. Popliteal Vein: No evidence of thrombus. Normal compressibility, respiratory phasicity and response to augmentation. Calf Veins: No evidence of thrombus. Normal compressibility and flow on color Doppler imaging. Superficial Great Saphenous Vein: No evidence of thrombus. Normal compressibility. Venous Reflux:  None. Other Findings:  None. IMPRESSION: No evidence of deep venous thrombosis. Electronically Signed   By: Jacqulynn Cadet M.D.   On: 04/12/2018 12:12   Dg Chest Port 1 View  Result Date: 04/16/2018 CLINICAL DATA:  Acute onset of dyspnea. EXAM: PORTABLE CHEST 1 VIEW COMPARISON:  Chest radiograph performed 04/12/2018 FINDINGS: The lungs are well-aerated. Vascular congestion is noted. Increased interstitial markings raise concern for pulmonary edema. Small bilateral pleural effusions are noted. No pneumothorax is seen. The cardiomediastinal silhouette is normal in size. A pacemaker is noted overlying the left chest wall, with leads ending overlying the right atrium and right ventricle. No acute osseous abnormalities are seen. The patient's  right PICC is noted ending about the proximal SVC. Cervical spinal fusion  hardware is noted. IMPRESSION: Vascular congestion noted. Increased interstitial markings raise concern for pulmonary edema. Small bilateral pleural effusions noted. Electronically Signed   By: Garald Balding M.D.   On: 04/16/2018 03:56   Dg Chest Port 1 View  Result Date: 04/12/2018 CLINICAL DATA:  Check endotracheal tube placement EXAM: PORTABLE CHEST 1 VIEW COMPARISON:  04/11/2018 FINDINGS: Endotracheal tube, nasogastric catheter and right-sided PICC line are again seen and stable. Pacing device is again noted. Cardiac shadow is stable. Bilateral pleural effusions are again identified. Central vascular congestion is seen and stable. No new focal abnormality is noted. No bony abnormality is seen. IMPRESSION: Stable changes bilaterally.  No new focal abnormality is seen. Electronically Signed   By: Inez Catalina M.D.   On: 04/12/2018 09:31   Dg Chest Port 1 View  Result Date: 04/11/2018 CLINICAL DATA:  Ventilated patient EXAM: PORTABLE CHEST 1 VIEW COMPARISON:  04/10/18 FINDINGS: Endotracheal tube and feeding tube unchanged. PICC line unchanged. Stable cardiac silhouette. Bilateral pleural effusions. Central venous congestion. IMPRESSION: 1. Stable support apparatus. 2. Bilateral pleural effusions and central venous congestion. 3. No interval change. Electronically Signed   By: Suzy Bouchard M.D.   On: 04/11/2018 10:22   Portable Chest Xray  Result Date: 04/10/2018 CLINICAL DATA:  Acute respiratory failure EXAM: PORTABLE CHEST 1 VIEW COMPARISON:  04/09/2018 FINDINGS: Cardiac shadow is mildly prominent. Pacing device is again seen. Nasogastric catheter and endotracheal tube as well as a right-sided PICC line are again noted and stable. Postsurgical changes in the cervical spine are seen. Stable right-sided pleural effusion is noted. Mild vascular congestion remains. IMPRESSION: Overall stable appearance of the chest when compare  with the previous day. Electronically Signed   By: Inez Catalina M.D.   On: 04/10/2018 07:04   Dg Chest Port 1 View  Result Date: 04/09/2018 CLINICAL DATA:  Acute respiratory failure.  Intubation. EXAM: PORTABLE CHEST 1 VIEW COMPARISON:  Radiograph 04/06/2018 FINDINGS: Tip of the endotracheal tube approximately 4.2 cm from the carina. Enteric tube in place with tip below the diaphragm not included in the field of view. Right upper extremity PICC tip in the proximal SVC. Left-sided pacemaker remains in place. Hazy opacity throughout the right hemithorax likely layering pleural effusion with associated atelectasis or airspace disease. Increasing left basilar opacity may be effusion and atelectasis/airspace disease. No pneumothorax. IMPRESSION: 1. Endotracheal tube tip approximately 4.2 cm from the carina. 2. Progressive hazy opacity throughout the right hemithorax, likely layering pleural effusion with associated atelectasis or airspace disease. Left basilar opacity consistent with pleural fluid and atelectasis/airspace disease. Electronically Signed   By: Jeb Levering M.D.   On: 04/09/2018 02:55   Dg Chest Port 1 View  Result Date: 04/06/2018 CLINICAL DATA:  Status post central line placement today. EXAM: PORTABLE CHEST 1 VIEW COMPARISON:  Single-view of the chest 02/04/2018. FINDINGS: NG tube courses into the stomach and below the inferior margin of the film. Right PICC is identified with the tip in the mid to lower superior vena cava. Left basilar airspace disease identified. Mild subsegmental atelectasis is seen in the right lung base. No pneumothorax or pleural effusion. Heart size is upper normal. Aortic atherosclerosis and pacing device are noted. IMPRESSION: Tip of right PICC is in the mid to lower superior vena cava. Left basilar airspace disease could be due to atelectasis or pneumonia. Mild subsegmental atelectasis right lung base noted. Atherosclerosis. Electronically Signed   By: Inge Rise M.D.   On: 04/06/2018 15:19   Dg Abd  Portable 1v  Result Date: 04/07/2018 CLINICAL DATA:  77 year old female with a history of small bowel obstruction EXAM: PORTABLE ABDOMEN - 1 VIEW COMPARISON:  04/05/2018, 04/04/2018, CT 04/04/2018 FINDINGS: Gastric tube terminates within the left upper quadrant. Cholecystectomy. Surgical changes in the low abdomen/pelvis. Surgical changes of prior lumbar fixation. Gas and formed stool within the right colon.  No colonic distention. No distended small bowel loops. IMPRESSION: Nonobstructive bowel gas pattern. Gastric tube terminates within the left upper quadrant. Electronically Signed   By: Corrie Mckusick D.O.   On: 04/07/2018 12:24   Dg Hip Unilat With Pelvis 2-3 Views Right  Result Date: 04/17/2018 CLINICAL DATA:  Right hip pain without known injury. EXAM: DG HIP (WITH OR WITHOUT PELVIS) 2-3V RIGHT COMPARISON:  None. FINDINGS: There is no evidence of hip fracture or dislocation. There is no evidence of arthropathy or other focal bone abnormality. IMPRESSION: Normal right hip. Electronically Signed   By: Marijo Conception, M.D.   On: 04/17/2018 15:44   [place] Ekg Site Rite  Result Date: 04/06/2018 If Site Rite image not attached, placement could not be confirmed due to current cardiac rhythm.   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 Her last A1c was 6.2 Will Discharge her of the same dose She will continue to do Accu-Cheks and follow with her PCP  PAF (paroxysmal  atrial fibrillation) ([place]) Off her amiodarone  In sinus rhythm right now on metoprolol Continue Eliquis No signs of any GI bleed Patient follow-up with her cardiology  Acute respiratory failure with hypoxia and hypercapnia  Patient continued to have hypoxia in the facility She gets short of breath on exertion Most likely combination of CHF and Obesity Would be discharged home on oxygen CHF Was diuresed and lost almost 30 pounds in the hospital Her weight stayed stable.  Her kidney function is stable. Her discharge weight is 232. Patient was told to weigh herself at home and to inform PCP if her weight increases by 5 pounds in a week Arthritis and low back pain.  She is on hydrocodone as needed would give her supply for 2 weeks with follow-up with PCP     Patient is being discharged with the following home health services:    Patient is being discharged with the following durable medical equipment:    Patient has been advised to f/u with their PCP in 1-2 weeks to for a transitions of care visit.  Social services at their facility was responsible for arranging this appointment.  Pt was provided with adequate prescriptions of noncontrolled medications to reach the scheduled appointment .  For controlled substances, a limited supply was provided as appropriate for the individual patient.  If the pt normally receives these medications from a pain clinic or has a contract with another physician, these medications should be received from that clinic or physician only).    Future labs/tests needed:  CBC, Hepatic Panel and BMP in 2 weeks  Discharge More then 30 min

## 2018-05-07 ENCOUNTER — Telehealth: Payer: Self-pay | Admitting: Internal Medicine

## 2018-05-07 NOTE — Telephone Encounter (Signed)
New Message    Patients spouse is calling on her behalf. He states that the patients heart rate has been steady at 100. When she was in the rehab they were giving her the metoprolol tartrate (LOPRESSOR) 25 MG tablet twice a day. He wants to know can he go back to the twice a day. Please call.

## 2018-05-07 NOTE — Telephone Encounter (Signed)
I called and spoke with Mr. Larocca. He states [name] had taken the patient off metoprolol prior to her recent hospitalization, but she was put back on this and taken off amiodarone at discharge.  The patient went to [facility] after discharge and was receiving metoprolol. Mr. Luttrull just wanted to make sure that now that she is home, does she need metoprolol. Her HR's are running in the low 100's. I have reviewed the patient's records and see that Dr. Saunders Revel stopped her amiodarone and restarted metoprolol during her hospitalization.  She was off eliquis at the time and having a-fib. I have advised Mr. Wurzel to have the patient take her metoprolol and hold amiodarone for now as per hospital discharge notes.  The patient sees [name] back on 05/22/18. Mr. Kouns voices understanding and is agreeable.

## 2018-05-08 ENCOUNTER — Ambulatory Visit (INDEPENDENT_AMBULATORY_CARE_PROVIDER_SITE_OTHER): Payer: Medicare Other | Admitting: Gastroenterology

## 2018-05-08 VITALS — BP 83/62 | HR 83 | Ht 64.0 in

## 2018-05-08 DIAGNOSIS — K317 Polyp of stomach and duodenum: Secondary | ICD-10-CM | POA: Diagnosis not present

## 2018-05-08 DIAGNOSIS — I209 Angina pectoris, unspecified: Secondary | ICD-10-CM | POA: Diagnosis not present

## 2018-05-08 NOTE — Progress Notes (Signed)
Jonathon Bellows MD, MRCP(U.K) [address]  [place]  [place], [place] [address]  Main: [phone]  Fax: [phone]   Primary Care Physician: [name], MD  Primary Gastroenterologist:  Dr. Jonathon Bellows   No chief complaint on file.   HPI: Deborah Obrien is a 77 y.o. female   She is here to see me today as a hospital follow up.    Summary of history :  I was consulted to see her on 04/05/18 when she was admitted with nausea,vomiting and found to have small bowel obstruction and was seen by surgery at that time. She had a upper endoscopy by me back in 2016 for abdominal pain and had multiple gastric polyps that were biopsied and were benign.  The patient has been on Eliquis that was started by her cardiologist.  The patient had an NG tube placed with dark material coming out of the NG tube.  The patient's baseline hemoglobin had been around 13.5 and dropped to 9.6.I performed an EGD on 04/12/18 and noted a lare gastric polyp that was oozing . It was treated with epinephrine and suseqeuntly sprayed with hemospray and seemed to stop the bleeding.   CBC Latest Ref Rng & Units 05/02/2018 04/21/2018 04/20/2018  WBC 3.6 - 11.0 K/uL 4.4 4.9 5.4  Hemoglobin 12.0 - 16.0 g/dL 11.8(L) 8.9(L) 8.7(L)  Hematocrit 35.0 - 47.0 % 35.2 26.6(L) 25.3(L)  Platelets 150 - 440 K/uL 308 294 326     Interval history   Since discharge till 05/08/18  Off rehab, brown stool , on eloquis, in a wheel chair, cant lay flat at night, on oxygen . Feels short of breath. No other GI complaints.     Current Outpatient Medications  Medication Sig Dispense Refill  . acetaminophen (TYLENOL) 325 MG tablet Take 2 tablets (650 mg total) by mouth every 6 (six) hours as needed for mild pain (or Fever >/= 101).    Marland Kitchen allopurinol (ZYLOPRIM) 100 MG tablet Take 150 mg by mouth daily.     Marland Kitchen apixaban (ELIQUIS) 5 MG TABS tablet Take 1 tablet (5 mg total) by mouth 2 (two) times daily. 180 tablet 3  .  benzocaine-Menthol (DERMOPLAST) 20-0.5 % AERO Apply 1 application topically 4 (four) times daily as needed for irritation.    . bisacodyl (DULCOLAX) 10 MG suppository Place 1 suppository (10 mg total) rectally daily as needed for moderate constipation. 12 suppository 0  . busPIRone (BUSPAR) 10 MG tablet Take 10 mg by mouth 2 (two) times daily.     . cholecalciferol (VITAMIN D) 1000 units tablet Take 1,000 Units by mouth daily.    . colchicine 0.6 MG tablet Take 0.6 mg by mouth daily as needed (gout).     . dibucaine (NUPERCAINAL) 1 % OINT Place 1 application rectally as needed for hemorrhoids.  0  . diphenhydrAMINE (BENADRYL) 25 mg capsule Take 1 capsule (25 mg total) by mouth at bedtime. 30 capsule 0  . docusate sodium (COLACE) 100 MG capsule Take 100 mg by mouth at bedtime.     . ferrous sulfate 325 (65 FE) MG EC tablet Take 1 tablet (325 mg total) by mouth 2 (two) times daily. 60 tablet 3  . furosemide (LASIX) 40 MG tablet Take 1 tablet (40 mg total) by mouth 2 (two) times daily. 30 tablet   . HYDROcodone-acetaminophen (NORCO/VICODIN) 5-325 MG tablet Take 1 tablet by mouth every 6 (six) hours as needed for moderate pain. 120 tablet 0  . hydrocortisone  2.5 % cream Apply 1 application topically daily as needed (rash).    . insulin NPH-regular Human (NOVOLIN 70/30) (70-30) 100 UNIT/ML injection Inject 22 Units into the skin 2 (two) times daily.     Marland Kitchen ipratropium-albuterol (DUONEB) 0.5-2.5 (3) MG/3ML SOLN Take 3 mLs by nebulization 2 (two) times daily. 360 mL   . lidocaine (LIDODERM) 5 % Place 1 patch onto the skin daily. Remove & Discard patch within 12 hours or as directed by MD 30 patch 0  . metoprolol tartrate (LOPRESSOR) 25 MG tablet Take 1 tablet (25 mg total) by mouth 2 (two) times daily.    . Multiple Vitamin (MULTIVITAMIN WITH MINERALS) TABS tablet Take 1 tablet by mouth daily.    Marland Kitchen neomycin-bacitracin-polymyxin (NEOSPORIN) ointment Apply topically as needed for wound care. 15 g 0  . NON  FORMULARY Diet Type: Mechanical Soft Chopped    . ondansetron (ZOFRAN) 4 MG tablet Take 1 tablet (4 mg total) by mouth every 6 (six) hours as needed for nausea. 20 tablet 0  . oxybutynin (DITROPAN-XL) 5 MG 24 hr tablet Take 2.5 mg by mouth at bedtime.    . OXYGEN Inhale 2 L into the lungs continuous.    . pantoprazole (PROTONIX) 40 MG tablet Take 1 tablet (40 mg total) by mouth 2 (two) times daily. 60 tablet 0  . PARoxetine (PAXIL) 40 MG tablet Take 40 mg by mouth every morning.    Vladimir Faster Glycol-Propyl Glycol (SYSTANE) 0.4-0.3 % SOLN Place 1 drop into both eyes 2 (two) times daily as needed.    . polyethylene glycol (MIRALAX / GLYCOLAX) packet Take 17 g by mouth daily. 14 each 0  . pravastatin (PRAVACHOL) 20 MG tablet Take 20 mg by mouth at bedtime.     . senna-docusate (SENOKOT-S) 8.6-50 MG tablet Take 1 tablet by mouth every morning.    . talc (ZEASORB) powder Apply 1 application topically as needed (under the breast irritation).     No current facility-administered medications for this visit.     Allergies as of 05/08/2018 - Review Complete 05/02/2018  Allergen Reaction Noted  . Cephalosporins Other (See Comments) 01/19/2015  . Fish allergy Shortness Of Breath 11/26/2015  . Macrolides and ketolides Other (See Comments) 01/12/2015  . Meperidine Shortness Of Breath, Nausea Only, and Other (See Comments) 01/19/2015  . Other Nausea Only, Rash, and Other (See Comments) 01/12/2015  . Prednisone Anaphylaxis and Other (See Comments) 01/19/2015  . Shellfish allergy Anaphylaxis, Shortness Of Breath, Diarrhea, Nausea Only, and Rash 06/29/2015  . Sulfa antibiotics Shortness Of Breath, Diarrhea, Nausea Only, and Other (See Comments) 06/25/2013  . Sulfacetamide sodium Diarrhea, Nausea Only, Other (See Comments), and Shortness Of Breath 08/12/2015  . Telbivudine  08/12/2015  . Uloric [febuxostat] Anaphylaxis 08/22/2016  . Aspirin Other (See Comments) 01/19/2015  . Celecoxib Other (See Comments)  01/19/2015  . Cephalexin Diarrhea, Nausea Only, and Other (See Comments) 01/19/2015  . Erythromycin Diarrhea, Nausea Only, and Other (See Comments) 01/19/2015  . Hydromorphone Other (See Comments) 01/19/2015  . Iodinated diagnostic agents Rash and Other (See Comments) 01/19/2015  . Oxycodone Other (See Comments) 01/19/2015  . Atorvastatin Nausea Only and Other (See Comments) 01/19/2015  . Codeine Diarrhea, Nausea Only, and Other (See Comments) 01/19/2015  . Doxycycline  08/24/2017  . Tape Other (See Comments) 06/29/2015  . Valdecoxib Swelling and Other (See Comments) 01/19/2015  . Iodine Rash 06/29/2015    ROS:  General: Negative for anorexia, weight loss, fever, chills, fatigue, weakness. ENT: Negative for hoarseness,  difficulty swallowing , nasal congestion. CV: Negative for chest pain, angina, palpitations, dyspnea on exertion, peripheral edema.  Respiratory: Negative for dyspnea at rest, dyspnea on exertion, cough, sputum, wheezing.  GI: See history of present illness. GU:  Negative for dysuria, hematuria, urinary incontinence, urinary frequency, nocturnal urination.  Endo: Negative for unusual weight change.    Physical Examination:   There were no vitals taken for this visit.  In a wheel chair on oxygen  Eyes: No icterus. Conjunctivae pink. Mouth: Oropharyngeal mucosa moist and pink , no lesions erythema or exudate. Lungs: Clear to auscultation bilaterally. Non-labored. Heart: Regular rate and rhythm, no murmurs rubs or gallops.  Abdomen: Bowel sounds are normal, nontender, nondistended, no hepatosplenomegaly or masses, no abdominal bruits or hernia , no rebound or guarding.   Extremities: No lower extremity edema. No clubbing or deformities. Neuro: Alert and oriented x 3.  Grossly intact. Skin: Warm and dry, no jaundice.   Psych: Alert and cooperative, normal mood and affect.   Imaging Studies: Ct Hip Right Wo Contrast  Result Date: 04/22/2018 CLINICAL DATA:  Right  hip pain. Unable to sleep last night because of the pain. EXAM: CT OF THE RIGHT HIP WITHOUT CONTRAST TECHNIQUE: Multidetector CT imaging of the right hip was performed according to the standard protocol. Multiplanar CT image reconstructions were also generated. COMPARISON:  None. FINDINGS: Bones/Joint/Cartilage No fracture or dislocation. Normal alignment. No joint effusion. Mild-moderate osteoarthritis of the right hip with moderate posterior and mild superior joint space narrowing and marginal osteophytes. Degenerative changes of the pubic symphysis. Enthesopathic changes at the right hamstring origin. No periosteal reaction or bone destruction. Ligaments Ligaments are suboptimally evaluated by CT. Muscles and Tendons Muscles are normal. No intramuscular fluid collection or hematoma. No muscle atrophy. Soft tissue No fluid collection or hematoma.  No soft tissue mass. IMPRESSION: 1.  No acute osseous injury of the right hip. 2. Mild-moderate osteoarthritis of the right hip. Electronically Signed   By: Kathreen Devoid   On: 04/22/2018 13:58   US Venous Img Lower Bilateral  Result Date: 04/12/2018 CLINICAL DATA:  77 year old female with bilateral lower extremity edema for the past 3 days. EXAM: BILATERAL LOWER EXTREMITY VENOUS DOPPLER ULTRASOUND TECHNIQUE: Gray-scale sonography with graded compression, as well as color Doppler and duplex ultrasound were performed to evaluate the lower extremity deep venous systems from the level of the common femoral vein and including the common femoral, femoral, profunda femoral, popliteal and calf veins including the posterior tibial, peroneal and gastrocnemius veins when visible. The superficial great saphenous vein was also interrogated. Spectral Doppler was utilized to evaluate flow at rest and with distal augmentation maneuvers in the common femoral, femoral and popliteal veins. COMPARISON:  None. FINDINGS: RIGHT LOWER EXTREMITY Common Femoral Vein: No evidence of thrombus.  Normal compressibility, respiratory phasicity and response to augmentation. Saphenofemoral Junction: No evidence of thrombus. Normal compressibility and flow on color Doppler imaging. Profunda Femoral Vein: No evidence of thrombus. Normal compressibility and flow on color Doppler imaging. Femoral Vein: No evidence of thrombus. Normal compressibility, respiratory phasicity and response to augmentation. Popliteal Vein: No evidence of thrombus. Normal compressibility, respiratory phasicity and response to augmentation. Calf Veins: No evidence of thrombus. Normal compressibility and flow on color Doppler imaging. Superficial Great Saphenous Vein: No evidence of thrombus. Normal compressibility. Venous Reflux:  None. Other Findings:  None. LEFT LOWER EXTREMITY Common Femoral Vein: No evidence of thrombus. Normal compressibility, respiratory phasicity and response to augmentation. Saphenofemoral Junction: No evidence of thrombus. Normal compressibility  and flow on color Doppler imaging. Profunda Femoral Vein: No evidence of thrombus. Normal compressibility and flow on color Doppler imaging. Femoral Vein: No evidence of thrombus. Normal compressibility, respiratory phasicity and response to augmentation. Popliteal Vein: No evidence of thrombus. Normal compressibility, respiratory phasicity and response to augmentation. Calf Veins: No evidence of thrombus. Normal compressibility and flow on color Doppler imaging. Superficial Great Saphenous Vein: No evidence of thrombus. Normal compressibility. Venous Reflux:  None. Other Findings:  None. IMPRESSION: No evidence of deep venous thrombosis. Electronically Signed   By: Jacqulynn Cadet M.D.   On: 04/12/2018 12:12   Dg Chest Port 1 View  Result Date: 04/16/2018 CLINICAL DATA:  Acute onset of dyspnea. EXAM: PORTABLE CHEST 1 VIEW COMPARISON:  Chest radiograph performed 04/12/2018 FINDINGS: The lungs are well-aerated. Vascular congestion is noted. Increased interstitial  markings raise concern for pulmonary edema. Small bilateral pleural effusions are noted. No pneumothorax is seen. The cardiomediastinal silhouette is normal in size. A pacemaker is noted overlying the left chest wall, with leads ending overlying the right atrium and right ventricle. No acute osseous abnormalities are seen. The patient's right PICC is noted ending about the proximal SVC. Cervical spinal fusion hardware is noted. IMPRESSION: Vascular congestion noted. Increased interstitial markings raise concern for pulmonary edema. Small bilateral pleural effusions noted. Electronically Signed   By: Garald Balding M.D.   On: 04/16/2018 03:56   Dg Chest Port 1 View  Result Date: 04/12/2018 CLINICAL DATA:  Check endotracheal tube placement EXAM: PORTABLE CHEST 1 VIEW COMPARISON:  04/11/2018 FINDINGS: Endotracheal tube, nasogastric catheter and right-sided PICC line are again seen and stable. Pacing device is again noted. Cardiac shadow is stable. Bilateral pleural effusions are again identified. Central vascular congestion is seen and stable. No new focal abnormality is noted. No bony abnormality is seen. IMPRESSION: Stable changes bilaterally.  No new focal abnormality is seen. Electronically Signed   By: Inez Catalina M.D.   On: 04/12/2018 09:31   Dg Chest Port 1 View  Result Date: 04/11/2018 CLINICAL DATA:  Ventilated patient EXAM: PORTABLE CHEST 1 VIEW COMPARISON:  04/10/18 FINDINGS: Endotracheal tube and feeding tube unchanged. PICC line unchanged. Stable cardiac silhouette. Bilateral pleural effusions. Central venous congestion. IMPRESSION: 1. Stable support apparatus. 2. Bilateral pleural effusions and central venous congestion. 3. No interval change. Electronically Signed   By: Suzy Bouchard M.D.   On: 04/11/2018 10:22   Portable Chest Xray  Result Date: 04/10/2018 CLINICAL DATA:  Acute respiratory failure EXAM: PORTABLE CHEST 1 VIEW COMPARISON:  04/09/2018 FINDINGS: Cardiac shadow is mildly  prominent. Pacing device is again seen. Nasogastric catheter and endotracheal tube as well as a right-sided PICC line are again noted and stable. Postsurgical changes in the cervical spine are seen. Stable right-sided pleural effusion is noted. Mild vascular congestion remains. IMPRESSION: Overall stable appearance of the chest when compare with the previous day. Electronically Signed   By: Inez Catalina M.D.   On: 04/10/2018 07:04   Dg Chest Port 1 View  Result Date: 04/09/2018 CLINICAL DATA:  Acute respiratory failure.  Intubation. EXAM: PORTABLE CHEST 1 VIEW COMPARISON:  Radiograph 04/06/2018 FINDINGS: Tip of the endotracheal tube approximately 4.2 cm from the carina. Enteric tube in place with tip below the diaphragm not included in the field of view. Right upper extremity PICC tip in the proximal SVC. Left-sided pacemaker remains in place. Hazy opacity throughout the right hemithorax likely layering pleural effusion with associated atelectasis or airspace disease. Increasing left basilar opacity may be  effusion and atelectasis/airspace disease. No pneumothorax. IMPRESSION: 1. Endotracheal tube tip approximately 4.2 cm from the carina. 2. Progressive hazy opacity throughout the right hemithorax, likely layering pleural effusion with associated atelectasis or airspace disease. Left basilar opacity consistent with pleural fluid and atelectasis/airspace disease. Electronically Signed   By: Jeb Levering M.D.   On: 04/09/2018 02:55   Dg Hip Unilat With Pelvis 2-3 Views Right  Result Date: 04/17/2018 CLINICAL DATA:  Right hip pain without known injury. EXAM: DG HIP (WITH OR WITHOUT PELVIS) 2-3V RIGHT COMPARISON:  None. FINDINGS: There is no evidence of hip fracture or dislocation. There is no evidence of arthropathy or other focal bone abnormality. IMPRESSION: Normal right hip. Electronically Signed   By: Marijo Conception, M.D.   On: 04/17/2018 15:44    Assessment and Plan:   Deborah Obrien is a 77  y.o. y/o female here to follow up to her recent hospitalization when she was admitted with SBO and upper GI bleed secondary to a large gastric polyp which was treated with epinephrine and hemospray. Was on Eloquis. Since discharge Hb has been stable and is doing ok. Explained the gastric polyp may bleed again and would need to be resected. At present she is still looking weak, cannot lay flat, on oxygen and would be very high risk for anesthesia. I will plan to see her back in 6 weeks to see if her breathing is better and decide on timing of endoscopy. In the meanwhile will monitor CBC closely , check next week and probably every 2-3 weeks for next 6 weeks .   Dr Jonathon Bellows  MD,MRCP [name]) Follow up in 6 weeks

## 2018-05-10 ENCOUNTER — Telehealth: Payer: Self-pay | Admitting: Internal Medicine

## 2018-05-10 DIAGNOSIS — I480 Paroxysmal atrial fibrillation: Secondary | ICD-10-CM

## 2018-05-10 MED ORDER — AMIODARONE HCL 200 MG PO TABS: 200.0000 mg | ORAL_TABLET | Freq: Two times a day (BID) | ORAL | 3 refills | Status: DC

## 2018-05-10 MED ORDER — AMIODARONE HCL 200 MG PO TABS: 200.0000 mg | ORAL_TABLET | Freq: Two times a day (BID) | ORAL | Status: DC

## 2018-05-10 NOTE — Telephone Encounter (Signed)
The patient was recently admitted to [facility] from 04/05/18-04/23/18 for a small bowel obstruction complicated by respiratory failure, GI bleeding, and a-fib with RVR.   Eliquis was stopped during hospitalization due to GI bleeding requiring transfusion. The patient was on an amiodarone gtt, but amiodarone was recommended to be stopped per Dr. Saunders Revel in the setting of the patient being off eliquis.  Eliquis was restarted during the patient's admission on 04/20/18. Amiodarone was not resumed. The patient was discharged on metoprolol with recommendations that amiodarone may need to be reconsidered as an outpatient if the patient started having elevated HR's with recurrent a-fib.   I spoke with the patient and her husband this morning.  I have asked that they send a transmission in for her device so this can be reviewed first. They are aware I will call them back after her transmission is received and reviewed by [name].   They are aware and agreeable.

## 2018-05-10 NOTE — Addendum Note (Signed)
Addended by: Alvis Lemmings C on: 05/10/2018 02:54 PM   Modules accepted: Orders

## 2018-05-10 NOTE — Telephone Encounter (Signed)
Transmission received pt has been in AF for at least 3 days. See pt hisotgrams    03/20/18-05/07/18                                                             8/26-8/29     EGM at time of transmission

## 2018-05-10 NOTE — Telephone Encounter (Signed)
Reviewed the patient's transmission with [name]. Orders received to restart amiodarone 200 mg- take 1 tablet by mouth twice daily & recheck a CBC.  I have notified the patient's husband of the above recommendations and he is agreeable/ voices understanding. He states the patient did follow up with the GI doctor yesterday but no labs were ordered. They will go today or tomorrow for a follow up CBC.

## 2018-05-10 NOTE — Addendum Note (Signed)
Addended by: Alvis Lemmings C on: 05/10/2018 02:51 PM   Modules accepted: Orders

## 2018-05-10 NOTE — Telephone Encounter (Signed)
Patient husband Deborah Obrien calling  Patient c/o Palpitations:  High priority if patient c/o lightheadedness, shortness of breath, or chest pain  1) How long have you had palpitations/irregular HR/ Afib? Are you having the symptoms now? In afib, started noticing around July 24th  2) Are you currently experiencing lightheadedness, SOB or CP? No   3) Do you have a history of afib (atrial fibrillation) or irregular heart rhythm? yes  4) Have you checked your BP or HR? (document readings if available): HR will go to 105 then back down to 70s  5) Are you experiencing any other symptoms? no  Patient has been taking metoprolol 1 in morning and 1 at night Would like to know if it should be increased

## 2018-05-11 ENCOUNTER — Other Ambulatory Visit
Admission: RE | Admit: 2018-05-11 | Discharge: 2018-05-11 | Disposition: A | Discharge status: Home / Self Care | Payer: Medicare Other | Source: Ambulatory Visit | Attending: Internal Medicine | Admitting: Internal Medicine

## 2018-05-11 DIAGNOSIS — I480 Paroxysmal atrial fibrillation: Secondary | ICD-10-CM | POA: Diagnosis present

## 2018-05-11 LAB — CBC WITH DIFFERENTIAL/PLATELET
BASOS ABS: 0.1 10*3/uL (ref 0–0.1)
BASOS PCT: 1 %
EOS ABS: 0.1 10*3/uL (ref 0–0.7)
Eosinophils Relative: 3 %
HCT: 38.7 % (ref 35.0–47.0)
Hemoglobin: 12.7 g/dL (ref 12.0–16.0)
Lymphocytes Relative: 38 %
Lymphs Abs: 1.9 10*3/uL (ref 1.0–3.6)
MCH: 30.9 pg (ref 26.0–34.0)
MCHC: 32.9 g/dL (ref 32.0–36.0)
MCV: 93.7 fL (ref 80.0–100.0)
Monocytes Absolute: 0.6 10*3/uL (ref 0.2–0.9)
Monocytes Relative: 11 %
NEUTROS PCT: 47 %
Neutro Abs: 2.4 10*3/uL (ref 1.4–6.5)
Platelets: 228 10*3/uL (ref 150–440)
RBC: 4.13 MIL/uL (ref 3.80–5.20)
RDW: 15.6 % — ABNORMAL HIGH (ref 11.5–14.5)
WBC: 5.1 10*3/uL (ref 3.6–11.0)

## 2018-05-11 NOTE — Telephone Encounter (Signed)
Reviewed with Dr Fletcher Anon, DOD.  He advised for patient to continue on the amiodarone and monitor BP/HR.  S/w patient husband and he verbalized understanding of recommendations. He will call if any changes and is aware that their is an oncall provider if needed.

## 2018-05-11 NOTE — Telephone Encounter (Signed)
S/w patient's husband, ok per dpr. Says patient started the Amiodarone 200 mg yesterday, getting in 2 doses. Had third dose this morning. Patient blood pressure was 88/45, HR 104 this morning. Denies dizziness or lightheadedness. Denies chest pain or shortness of breath.  Other recent BP/HR: 8/24 AM 116/35, hr 63   PM 106/42, hr 77 8/26 AM 101/60, hr 105 8/27 AM 117/58, 88 AM   PM 86/54, HR 96 8/28 AM 105/55, HR 99.  Patient is concerned about the amiodarone causing the drop in BP. However, previous readings prior to restarting amiodarone were decreased at times as well. Patient takes Eliquis and metoprolol as well. Patient is going to [facility] for CBC today. Routing to Dr Fletcher Anon, DOD, for advice.

## 2018-05-11 NOTE — Telephone Encounter (Signed)
Patient husband returning call °

## 2018-05-11 NOTE — Telephone Encounter (Signed)
Pt c/o medication issue:  1. Name of Medication: Amiodarone  2. How are you currently taking this medication (dosage and times per day)? Unknown  3. Are you having a reaction (difficulty breathing--STAT)? No   4. What is your medication issue? Patient spouse is concerned about restarting amiodarone because in the past it drops bp to 17'G systolic

## 2018-05-11 NOTE — Telephone Encounter (Signed)
Noted  

## 2018-05-11 NOTE — Telephone Encounter (Signed)
Patient's husband would like the lab results from today that she got this afternoon. Reviewed the results and he verbalized understanding that they are stable.

## 2018-05-22 ENCOUNTER — Other Ambulatory Visit
Admission: RE | Admit: 2018-05-22 | Discharge: 2018-05-22 | Disposition: A | Discharge status: Home / Self Care | Payer: Medicare Other | Source: Ambulatory Visit | Attending: Gastroenterology | Admitting: Gastroenterology

## 2018-05-22 ENCOUNTER — Telehealth: Payer: Self-pay | Admitting: Gastroenterology

## 2018-05-22 ENCOUNTER — Encounter: Payer: Self-pay | Admitting: Internal Medicine

## 2018-05-22 ENCOUNTER — Encounter: Payer: Medicare Other | Admitting: Internal Medicine

## 2018-05-22 ENCOUNTER — Ambulatory Visit (INDEPENDENT_AMBULATORY_CARE_PROVIDER_SITE_OTHER): Payer: Medicare Other | Admitting: Internal Medicine

## 2018-05-22 VITALS — BP 80/60 | HR 105 | Ht 64.5 in | Wt 225.2 lb

## 2018-05-22 DIAGNOSIS — I495 Sick sinus syndrome: Secondary | ICD-10-CM

## 2018-05-22 DIAGNOSIS — Z950 Presence of cardiac pacemaker: Secondary | ICD-10-CM | POA: Diagnosis not present

## 2018-05-22 DIAGNOSIS — I209 Angina pectoris, unspecified: Secondary | ICD-10-CM | POA: Diagnosis not present

## 2018-05-22 DIAGNOSIS — I480 Paroxysmal atrial fibrillation: Secondary | ICD-10-CM | POA: Diagnosis not present

## 2018-05-22 DIAGNOSIS — K317 Polyp of stomach and duodenum: Secondary | ICD-10-CM | POA: Insufficient documentation

## 2018-05-22 DIAGNOSIS — I5030 Unspecified diastolic (congestive) heart failure: Secondary | ICD-10-CM | POA: Diagnosis not present

## 2018-05-22 LAB — CBC WITH DIFFERENTIAL/PLATELET
Basophils Absolute: 0.1 10*3/uL (ref 0–0.1)
Basophils Relative: 1 %
EOS ABS: 0.1 10*3/uL (ref 0–0.7)
Eosinophils Relative: 1 %
HCT: 39.2 % (ref 35.0–47.0)
HEMOGLOBIN: 13.2 g/dL (ref 12.0–16.0)
LYMPHS ABS: 2.2 10*3/uL (ref 1.0–3.6)
Lymphocytes Relative: 37 %
MCH: 30.9 pg (ref 26.0–34.0)
MCHC: 33.6 g/dL (ref 32.0–36.0)
MCV: 92 fL (ref 80.0–100.0)
Monocytes Absolute: 0.4 10*3/uL (ref 0.2–0.9)
Monocytes Relative: 7 %
NEUTROS PCT: 54 %
Neutro Abs: 3.1 10*3/uL (ref 1.4–6.5)
Platelets: 222 10*3/uL (ref 150–440)
RBC: 4.26 MIL/uL (ref 3.80–5.20)
RDW: 14.7 % — AB (ref 11.5–14.5)
WBC: 5.8 10*3/uL (ref 3.6–11.0)

## 2018-05-22 NOTE — Patient Instructions (Addendum)
Medication Instructions: - Your physician has recommended you make the following change in your medication:   1) STOP metoprolol for now due to low blood pressure  Labwork: - none ordered  Procedures/Testing: - none ordered  Follow-Up: - Your physician recommends that you schedule a follow-up appointment in: 3 months with [name].   Any Additional Special Instructions Will Be Listed Below (If Applicable).  - you have been given an order for water physical therapy for back pain   If you need a refill on your cardiac medications before your next appointment, please call your pharmacy.

## 2018-05-22 NOTE — Telephone Encounter (Signed)
PT husband  Is calling to inform Dr. Vicente Males that pt went to do her LAB work today

## 2018-05-22 NOTE — Progress Notes (Signed)
Patient Care Team: [name], MD as PCP - General (Internal Medicine) [name], MD (Internal Medicine) Bary Castilla [place] Gleason, MD (General Surgery)   HPI  Deborah Obrien is a 77 y.o. female Seen for sinus node dysfunction exercise intolerance and previously implanted single chamber pacemaker for which she underwent dual chamber upgrade 12/18 cx by encircling of the chronic RV lead by the atrial lead resulting in tension for which she underwent RA lead revision   She has hx of atrial arrhythmias, with atrial fib and flutter, typical/atypical all documented.     Admitted 5/19 with recurrent atrial flutter complicated by congestive heart failure.  She was restarted on amiodarone (having been discontinued 12/18) and discharged on a diuretic.  She has had problems with significant orthostatic intolerance further complicated by orthostatic dyspnea.  She was started on abdominal binder and her diuretics were held       11/16  Echo  EF 55-60   12/17 Echo  EF 45% Stress neg  12/17 Cath  Nonobstructive CAD  Mr 2+  2/19 Myoview EF 55-65% No ischemia     Date Cr K Hgb TSH LFTs  10/18 1.72  13.5    12/18 1.32  13.5    6/19 1.57 5.5>>4.0 13.7 2.95 71(5/19  8/19 1.53 3.8 11.8>>12.7  14   Hospitalized for most of the month of August because of GI bleeding.  She has a residual GI polyp Reusch polypectomy is anticipated but currently deferred.  She has had significant problems with back pain which is limited activity.  Vital signs recorded at home note hypotension and relative tachycardia consistent with device interrogation demonstrating atrial flutter/tachycardia with 2: 1 conduction and a ventricular rate of about 100 bpm  She has not had much peripheral edema.  She has had some shortness of breath.  Past Medical History:  Diagnosis Date  . A-fib ([place]) 2016  . Acid reflux   . Anxiety   . Bowel obstruction ([place])   . CHF (congestive heart failure) ([place])  2017  . Chronic kidney disease (CKD) stage G3a/A1, moderately decreased glomerular filtration rate (GFR) between 45-59 mL/min/1.73 square meter and albuminuria creatinine ratio less than 30 mg/g (HCC)   . Diabetes mellitus without complication ([place])   . Dyspnea   . Fatty liver   . GI bleed   . Gout   . Hypertension   . Morbid obesity ([place])   . Osteoarthritis   . Osteoporosis   . Paroxysmal atrial fibrillation (HCC)   . Presence of permanent cardiac pacemaker   . Sinus node dysfunction ([place]) 08/24/2017  . TIA (transient ischemic attack)     Past Surgical History:  Procedure Laterality Date  . ABDOMINAL HYSTERECTOMY  1980  . APPENDECTOMY    . BACK SURGERY     2008  . BREAST BIOPSY Right 08/16   stereo fibroadenomatous change, neg for atypia  . BREAST EXCISIONAL BIOPSY Left    neg  . CARDIAC CATHETERIZATION Left 08/25/2016   Procedure: Left Heart Cath and Coronary Angiography;  Surgeon: Corey Skains, MD;  Location: [facility];  Service: Cardiovascular;  Laterality: Left;  . CARDIOVERSION N/A 06/28/2017   Procedure: CARDIOVERSION;  Surgeon: Corey Skains, MD;  Location: [facility];  Service: Cardiovascular;  Laterality: N/A;  . CARDIOVERSION N/A 02/06/2018   Procedure: CARDIOVERSION;  Surgeon: Josue Hector, MD;  Location: [facility];  Service: Cardiovascular;  Laterality: N/A;  . CATARACT EXTRACTION, BILATERAL  2012  .  CHOLECYSTECTOMY    . colon blockage  1999  . COLON SURGERY  1999  . COLONOSCOPY  2016   polyps removed 2016  . DORSAL COMPARTMENT RELEASE Left 09/14/2016   Procedure: RELEASE DORSAL COMPARTMENT (DEQUERVAIN);  Surgeon: Dereck Leep, MD;  Location: [facility];  Service: Orthopedics;  Laterality: Left;  . ESOPHAGOGASTRODUODENOSCOPY N/A 01/27/2015   Procedure: ESOPHAGOGASTRODUODENOSCOPY (EGD);  Surgeon: Lollie Sails, MD;  Location: [facility];  Service: Endoscopy;  Laterality: N/A;  . ESOPHAGOGASTRODUODENOSCOPY (EGD) WITH PROPOFOL N/A  07/24/2015   Procedure: ESOPHAGOGASTRODUODENOSCOPY (EGD) WITH PROPOFOL;  Surgeon: Lucilla Lame, MD;  Location: [facility];  Service: Endoscopy;  Laterality: N/A;  . ESOPHAGOGASTRODUODENOSCOPY (EGD) WITH PROPOFOL N/A 04/12/2018   Procedure: ESOPHAGOGASTRODUODENOSCOPY (EGD) WITH PROPOFOL;  Surgeon: Jonathon Bellows, MD;  Location: [facility];  Service: Gastroenterology;  Laterality: N/A;  . JOINT REPLACEMENT  2014   Bilateral Knee replacement  . LEAD REVISION/REPAIR N/A 08/29/2017   Procedure: LEAD REVISION/REPAIR;  Surgeon: Constance Haw, MD;  Location: [facility];  Service: Cardiovascular;  Laterality: N/A;  . PACEMAKER INSERTION Left 07/01/2015   Procedure: INSERTION PACEMAKER;  Surgeon: Isaias Cowman, MD;  Location: [facility];  Service: Cardiovascular;  Laterality: Left;  . PACEMAKER REVISION N/A 08/28/2017   Procedure: PACEMAKER REVISION;  Surgeon: Deboraha Sprang, MD;  Location: [facility];  Service: Cardiovascular;  Laterality: N/A;  . REPLACEMENT TOTAL KNEE BILATERAL    . SHOULDER ARTHROSCOPY WITH SUBACROMIAL DECOMPRESSION Left 2013  . TEE WITHOUT CARDIOVERSION N/A 06/28/2017   Procedure: TRANSESOPHAGEAL ECHOCARDIOGRAM (TEE);  Surgeon: Corey Skains, MD;  Location: [facility];  Service: Cardiovascular;  Laterality: N/A;    Current Outpatient Medications  Medication Sig Dispense Refill  . acetaminophen (TYLENOL) 325 MG tablet Take 2 tablets (650 mg total) by mouth every 6 (six) hours as needed for mild pain (or Fever >/= 101).    Marland Kitchen allopurinol (ZYLOPRIM) 100 MG tablet Take 150 mg by mouth daily.     Marland Kitchen amiodarone (PACERONE) 200 MG tablet Take 1 tablet (200 mg total) by mouth 2 (two) times daily. 60 tablet 3  . apixaban (ELIQUIS) 5 MG TABS tablet Take 1 tablet (5 mg total) by mouth 2 (two) times daily. 180 tablet 3  . benzocaine-Menthol (DERMOPLAST) 20-0.5 % AERO Apply 1 application topically 4 (four) times daily as needed for irritation.    . bisacodyl  (DULCOLAX) 10 MG suppository Place 1 suppository (10 mg total) rectally daily as needed for moderate constipation. 12 suppository 0  . busPIRone (BUSPAR) 10 MG tablet Take 10 mg by mouth 2 (two) times daily.     . cholecalciferol (VITAMIN D) 1000 units tablet Take 1,000 Units by mouth daily.    . colchicine 0.6 MG tablet Take 0.6 mg by mouth daily as needed (gout).     . dibucaine (NUPERCAINAL) 1 % OINT Place 1 application rectally as needed for hemorrhoids.  0  . diphenhydrAMINE (BENADRYL) 25 mg capsule Take 1 capsule (25 mg total) by mouth at bedtime. 30 capsule 0  . docusate sodium (COLACE) 100 MG capsule Take 100 mg by mouth at bedtime.     . ferrous sulfate 325 (65 FE) MG EC tablet Take 1 tablet (325 mg total) by mouth 2 (two) times daily. 60 tablet 3  . furosemide (LASIX) 40 MG tablet Take 1 tablet (40 mg total) by mouth 2 (two) times daily. 30 tablet   . HYDROcodone-acetaminophen (NORCO/VICODIN) 5-325 MG tablet Take 1 tablet by mouth every 6 (six)  hours as needed for moderate pain. 120 tablet 0  . hydrocortisone 2.5 % cream Apply 1 application topically daily as needed (rash).    . insulin NPH-regular Human (NOVOLIN 70/30) (70-30) 100 UNIT/ML injection Inject 22 Units into the skin 2 (two) times daily.     Marland Kitchen ipratropium-albuterol (DUONEB) 0.5-2.5 (3) MG/3ML SOLN Take 3 mLs by nebulization 2 (two) times daily. 360 mL   . lidocaine (LIDODERM) 5 % Place 1 patch onto the skin daily. Remove & Discard patch within 12 hours or as directed by MD 30 patch 0  . metoprolol tartrate (LOPRESSOR) 25 MG tablet Take 1 tablet (25 mg total) by mouth 2 (two) times daily.    . Multiple Vitamin (MULTIVITAMIN WITH MINERALS) TABS tablet Take 1 tablet by mouth daily.    Marland Kitchen neomycin-bacitracin-polymyxin (NEOSPORIN) ointment Apply topically as needed for wound care. 15 g 0  . NON FORMULARY Diet Type: Mechanical Soft Chopped    . oxybutynin (DITROPAN-XL) 5 MG 24 hr tablet Take 2.5 mg by mouth at bedtime.    . OXYGEN  Inhale 2 L into the lungs continuous.    . pantoprazole (PROTONIX) 40 MG tablet Take 1 tablet (40 mg total) by mouth 2 (two) times daily. 60 tablet 0  . PARoxetine (PAXIL) 40 MG tablet Take 40 mg by mouth every morning.    Vladimir Faster Glycol-Propyl Glycol (SYSTANE) 0.4-0.3 % SOLN Place 1 drop into both eyes 2 (two) times daily as needed.    . polyethylene glycol (MIRALAX / GLYCOLAX) packet Take 17 g by mouth daily. 14 each 0  . pravastatin (PRAVACHOL) 20 MG tablet Take 20 mg by mouth at bedtime.     . senna-docusate (SENOKOT-S) 8.6-50 MG tablet Take 1 tablet by mouth every morning.    . talc (ZEASORB) powder Apply 1 application topically as needed (under the breast irritation).     No current facility-administered medications for this visit.     Allergies  Allergen Reactions  . Cephalosporins Other (See Comments)    Reaction:  Unknown   . Fish Allergy Shortness Of Breath  . Macrolides And Ketolides Other (See Comments)    Reaction:  Unknown  Other Reaction: Intolerance Other Reaction: Intolerance   . Meperidine Shortness Of Breath, Nausea Only and Other (See Comments)    Reaction:  Stomach pain   . Other Nausea Only, Rash and Other (See Comments)    Uncoded Allergy. Allergen: NON-STEROIDS, Other Reaction: Not Assessed bextra - hands and feet swell Shellfish - SOB Shellfish - SOB Other Reaction: Not Assessed  . Prednisone Anaphylaxis and Other (See Comments)    Reaction:  Increases pts blood sugar  Pt states that she is allergic to all steroids.    . Shellfish Allergy Anaphylaxis, Shortness Of Breath, Diarrhea, Nausea Only and Rash    Reaction:  Stomach pain  Other reaction(s): Other (See Comments) Reaction:Stomach pain   . Sulfa Antibiotics Shortness Of Breath, Diarrhea, Nausea Only and Other (See Comments)    Reaction:  Stomach pain  Reaction:Stomach pain  Other Reaction: Intolerance   . Sulfacetamide Sodium Diarrhea, Nausea Only, Other (See Comments) and Shortness Of  Breath    Reaction:  Stomach pain   . Telbivudine     Other reaction(s): Other (See Comments) Reaction:Unknown   . Uloric [Febuxostat] Anaphylaxis    Locks pt's body up   . Aspirin Other (See Comments)    Reaction:  Stomach pain   . Celecoxib Other (See Comments)    Reaction:  GI  bleed, weakness, and stomach pain.    . Cephalexin Diarrhea, Nausea Only and Other (See Comments)    Reaction:  Stomach pain   . Erythromycin Diarrhea, Nausea Only and Other (See Comments)    Reaction:  Stomach pain   . Hydromorphone Other (See Comments)    Reaction:  Hypotension   . Iodinated Diagnostic Agents Rash and Other (See Comments)    Pt states that she is unable to have because she has chronic kidney disease.   Pt states that she is unable to have because she has chronic kidney disease. CKD CKD  . Oxycodone Other (See Comments)    Reaction:  Stomach pain   . Atorvastatin Nausea Only and Other (See Comments)    Reaction:  Weakness   . Codeine Diarrhea, Nausea Only and Other (See Comments)    Reaction:  Stomach pain Pt tolerates morphine   . Doxycycline     Stomach pain   . Tape Other (See Comments)    Reaction:  Causes pts skin to tear  Pt states that she is able to use paper tape.      . Valdecoxib Swelling and Other (See Comments)    Pt states that her hands and feet swell.    . Iodine Rash      Review of Systems negative except from HPI and PMH  Physical Exam BP (!) 80/60 (BP Location: Left Arm, Patient Position: Sitting, Cuff Size: Large)   Pulse (!) 105   Ht 5' 4.5" (1.638 m)   Wt 225 lb 4 oz (102.2 kg)   BMI 38.07 kg/m  Well developed and nourished in mod resp distress wearing O2 and having significant back pain HENT normal Neck supple  Clear Rapid and regular rate and rhythm,   Abd-soft with active BS No Clubbing cyanosis tredema Skin-warm and dry A & Oriented  Grossly normal sensory and motor function   ECG atrial flutter with mostly 2: 1 conduction at the  atrial cycle length of 280 ms   Assessment and  Plan  Sinus node dysfunction  Paroxysmal atrial fibrillation/flutter with a rapid ventricular response  Cardiomyopathy-interval normalization   Diabetes mellitus with nephropathy  Orthostatic intolerance and falls  HFpEF  Hypotension  Thromboembolic risk profile as noted above   Medtronic pacemaker upgraded to dual chamber The patient's device was interrogated and the information was fully reviewed.  The device was reprogrammed to maximize longevity    She had not missed any of her anticoagulation.  We were successful in paced termination of her atrial flutter/tachycardia.  If she maintains sinus rhythm, we will maintain her on amiodarone to this end, she could have her anticoagulation held in about 3 weeks time for her endoscopy.  Hopefully the restoration of sinus rhythm will improve blood pressure.  We will also discontinue metoprolol as its utility in 2: 1 flutter is likely trivial.  Hospital records were reviewed and GI progress note  We spent more than 50% of our 40  min visit in face to face counseling regarding the above

## 2018-05-23 ENCOUNTER — Telehealth: Payer: Self-pay

## 2018-05-23 NOTE — Telephone Encounter (Signed)
-----   Message from Jonathon Bellows, MD sent at 05/22/2018  1:51 PM EDT ----- Deborah Obrien inform patient Hb is normal . She is not bleeding . I will see her back as scheduled in 4 weeks.

## 2018-05-23 NOTE — Telephone Encounter (Signed)
Called pt to inform her of lab results. LVM to return call

## 2018-05-25 ENCOUNTER — Telehealth: Payer: Self-pay

## 2018-05-25 ENCOUNTER — Other Ambulatory Visit: Payer: Self-pay | Admitting: Orthopaedic Surgery

## 2018-05-25 DIAGNOSIS — M546 Pain in thoracic spine: Secondary | ICD-10-CM

## 2018-05-25 DIAGNOSIS — M4326 Fusion of spine, lumbar region: Secondary | ICD-10-CM

## 2018-05-25 NOTE — Telephone Encounter (Signed)
Spoke with pt and informed her of lab results  

## 2018-05-28 ENCOUNTER — Telehealth: Payer: Self-pay | Admitting: Internal Medicine

## 2018-05-28 NOTE — Telephone Encounter (Signed)
Cardiologist order form needed for pt stateing pt is safe for MRI.

## 2018-05-28 NOTE — Telephone Encounter (Signed)
To [facility] to confirm if the patient has an MRI compatible device or not.

## 2018-05-28 NOTE — Telephone Encounter (Signed)
LMOM to return call with fax number to send device information.

## 2018-05-29 ENCOUNTER — Other Ambulatory Visit: Payer: Self-pay | Admitting: Orthopaedic Surgery

## 2018-05-29 ENCOUNTER — Telehealth: Payer: Self-pay

## 2018-05-29 ENCOUNTER — Ambulatory Visit (INDEPENDENT_AMBULATORY_CARE_PROVIDER_SITE_OTHER): Payer: Medicare Other | Admitting: *Deleted

## 2018-05-29 ENCOUNTER — Ambulatory Visit: Payer: Medicare Other | Admitting: Nurse Practitioner

## 2018-05-29 ENCOUNTER — Encounter

## 2018-05-29 DIAGNOSIS — I4891 Unspecified atrial fibrillation: Secondary | ICD-10-CM

## 2018-05-29 NOTE — Telephone Encounter (Signed)
Spoke with Dr Caryl Comes - ok to hold Eliquis for 3 days prior to procedure. Called pt to relay message since procedure is in 3 days and she is aware. Resume Eliquis ASAP after procedure due to elevated cardiac risk.

## 2018-05-29 NOTE — Telephone Encounter (Signed)
Patient husband calling to check on status

## 2018-05-29 NOTE — Telephone Encounter (Signed)
Pt takes Eliquis for afib with CHADS2-VASc score of 9 (CHF, HTN, AGE, DM2, stroke/tia x 2, CAD, AGE, female), although most recent BP readings have been low.  CrCl 37mL/min Platelet count 222  Pt also has history of GI bleed in July 2019. Typically hold DOACs for 3 days prior to spinal procedures, however pt is very high risk from a cardiac standpoint. Will defer to MD regarding appropriate length of time to hold Eliquis prior to spinal procedure.  Of note, clearance requested 3 days prior to procedure with request to hold anticoagulation for 3 days. Procedure will likely need to be rescheduled until anticoagulation clearance is provided by Dr Caryl Comes.

## 2018-05-29 NOTE — Telephone Encounter (Signed)
   [facility] Pre-operative Risk Assessment    Request for surgical clearance:  1. What type of surgery is being performed?  L1 Kyphoplasty   2. When is this surgery scheduled?  06/01/18   3. What type of clearance is required (medical clearance vs. Pharmacy clearance to hold med vs. Both)?  Both  4. Are there any medications that need to be held prior to surgery and how long? Eliquis 3 days   5. Practice name and name of physician performing surgery?  [facility]/ Dr Patrice Paradise   6. What is your office phone number  [phone]    7.   What is your office fax number [phone]  8.   Anesthesia type (None, local, MAC, general) ?  local   Frederik Schmidt 05/29/2018, 2:19 PM  _________________________________________________________________   (provider comments below)

## 2018-05-29 NOTE — Progress Notes (Signed)
Remote pacemaker transmission.   

## 2018-05-29 NOTE — Telephone Encounter (Signed)
Patient spouse calling to give [place]  Fax number [phone]   He is asking for a call back once we do this

## 2018-05-29 NOTE — Telephone Encounter (Signed)
LM informing patient's spouse that information regarding patient's ppm has been faxed. Encouraged patient/spouse to call back with any further questions.

## 2018-05-30 ENCOUNTER — Other Ambulatory Visit: Payer: Self-pay | Admitting: Orthopaedic Surgery

## 2018-05-30 DIAGNOSIS — M4326 Fusion of spine, lumbar region: Secondary | ICD-10-CM

## 2018-05-30 DIAGNOSIS — M546 Pain in thoracic spine: Secondary | ICD-10-CM

## 2018-05-30 NOTE — Telephone Encounter (Signed)
Called patient and informed her that clearance has been faxed to Dr. Ellin Mayhew office.

## 2018-05-30 NOTE — Telephone Encounter (Signed)
Pt spouse is calling states pt Dr. Deirdre Peer, pt spine doctor, states they have not received clearance.

## 2018-05-30 NOTE — Telephone Encounter (Signed)
Encounter faxed via Epic again to number provided.

## 2018-05-30 NOTE — Telephone Encounter (Signed)
   Primary Cardiologist: [name], MD  Chart reviewed as part of pre-operative protocol coverage. Patient was contacted 05/30/2018 in reference to pre-operative risk assessment for pending surgery as outlined below.  Deborah Obrien was last seen on 05/22/18 by [name].  Since that day, Deborah Obrien has done well w/o chest pain and dyspnea.  Therefore, based on ACC/AHA guidelines, the patient would be at acceptable risk for the planned procedure without further cardiovascular testing.   I will route this recommendation to the requesting party via Epic fax function and remove from pre-op pool.  Please call with questions.  Lyda Jester, PA-C 05/30/2018, 9:13 AM

## 2018-05-30 NOTE — Telephone Encounter (Signed)
Patient husband calling to check on status of clearance

## 2018-06-04 ENCOUNTER — Ambulatory Visit
Admission: RE | Admit: 2018-06-04 | Discharge: 2018-06-04 | Disposition: A | Discharge status: Home / Self Care | Payer: Medicare Other | Source: Ambulatory Visit | Attending: Orthopaedic Surgery | Admitting: Orthopaedic Surgery

## 2018-06-04 DIAGNOSIS — M546 Pain in thoracic spine: Secondary | ICD-10-CM

## 2018-06-04 DIAGNOSIS — M4326 Fusion of spine, lumbar region: Secondary | ICD-10-CM

## 2018-06-06 LAB — CUP PACEART INCLINIC DEVICE CHECK
Battery Remaining Longevity: 144 mo
Battery Voltage: 3.05 V
Brady Statistic AP VP Percent: 0.18 %
Brady Statistic AS VS Percent: 13.6 %
Brady Statistic RA Percent Paced: 56.94 %
Brady Statistic RV Percent Paced: 2.25 %
Date Time Interrogation Session: 20190910144610
Implantable Lead Implant Date: 20161019
Implantable Lead Location: 753860
Implantable Lead Model: 5076
Implantable Lead Model: 5076
Implantable Pulse Generator Implant Date: 20181217
Lead Channel Impedance Value: 323 Ohm
Lead Channel Impedance Value: 437 Ohm
Lead Channel Pacing Threshold Amplitude: 0.75 V
Lead Channel Pacing Threshold Amplitude: 1.125 V
Lead Channel Pacing Threshold Pulse Width: 0.4 ms
Lead Channel Sensing Intrinsic Amplitude: 8.75 mV
Lead Channel Setting Pacing Pulse Width: 0.4 ms
MDC IDC LEAD IMPLANT DT: 20181217
MDC IDC LEAD LOCATION: 753859
MDC IDC MSMT LEADCHNL RA IMPEDANCE VALUE: 437 Ohm
MDC IDC MSMT LEADCHNL RV IMPEDANCE VALUE: 380 Ohm
MDC IDC MSMT LEADCHNL RV PACING THRESHOLD PULSEWIDTH: 0.4 ms
MDC IDC MSMT LEADCHNL RV SENSING INTR AMPL: 9.25 mV
MDC IDC SET LEADCHNL RA PACING AMPLITUDE: 2.25 V
MDC IDC SET LEADCHNL RV PACING AMPLITUDE: 2.5 V
MDC IDC SET LEADCHNL RV SENSING SENSITIVITY: 1.2 mV
MDC IDC STAT BRADY AP VS PERCENT: 86.22 %
MDC IDC STAT BRADY AS VP PERCENT: 0.09 %

## 2018-06-13 ENCOUNTER — Telehealth: Payer: Self-pay

## 2018-06-13 ENCOUNTER — Telehealth: Payer: Self-pay | Admitting: Gastroenterology

## 2018-06-13 NOTE — Telephone Encounter (Signed)
Patient's spouse called in stating the patient is having sever RUQ pain that radiates to her back with diarrhea,nausea hurts under breast. She is not scheduled to see Dr Vicente Males until 06-26-18 and they don't feel the are able to wait that long. Please advise.

## 2018-06-13 NOTE — Telephone Encounter (Signed)
Spoke with Deborah Obrien and informed her of Dr. Georgeann Oppenheim advice. Deborah Obrien agreed to come in for an office  visit with Dr. Vicente Males on 06-14-18. I advised Deborah Obrien to go to the ED if her symptoms become worse.

## 2018-06-13 NOTE — Telephone Encounter (Signed)
I have sent an urgent message to Dr. Vicente Males for advice.

## 2018-06-13 NOTE — Telephone Encounter (Signed)
-----   Message from Jonathon Bellows, MD sent at 06/13/2018  2:44 PM EDT ----- Regarding: RE: Urgent Pt advice needed! 2 options- can see me tomorrow at 11 am or if severe can go to ER    ----- Message ----- From: Rushie Chestnut, [place]: 06/13/2018   2:04 PM EDT To: Jonathon Bellows, MD Subject: Urgent Pt advice needed!                       Dr. Vicente Males  Patient's spouse called stating Deborah Obrien is experiencing severe RUQ pain that radiates into her back and also has diarrhea, nausea, and pain under breast. Pt is scheduled to see you again on 06-26-18 but she does not feel like she is able to wait that long.  Please advise. Sherald Hess

## 2018-06-14 ENCOUNTER — Other Ambulatory Visit: Payer: Self-pay

## 2018-06-14 ENCOUNTER — Encounter: Payer: Self-pay | Admitting: Emergency Medicine

## 2018-06-14 ENCOUNTER — Other Ambulatory Visit: Payer: Self-pay | Admitting: Gastroenterology

## 2018-06-14 ENCOUNTER — Ambulatory Visit: Payer: Medicare Other

## 2018-06-14 ENCOUNTER — Ambulatory Visit (INDEPENDENT_AMBULATORY_CARE_PROVIDER_SITE_OTHER): Payer: Medicare Other | Admitting: Gastroenterology

## 2018-06-14 ENCOUNTER — Inpatient Hospital Stay
Admission: EM | Admit: 2018-06-14 | Discharge: 2018-06-22 | DRG: 683 | Disposition: A | Discharge status: Other Acute Inpatient Hospital | Payer: Medicare Other | Source: Ambulatory Visit | Attending: Internal Medicine | Admitting: Internal Medicine

## 2018-06-14 ENCOUNTER — Encounter: Payer: Self-pay | Admitting: Gastroenterology

## 2018-06-14 ENCOUNTER — Emergency Department: Payer: Medicare Other

## 2018-06-14 VITALS — BP 142/76 | HR 73 | Ht 64.0 in | Wt 225.0 lb

## 2018-06-14 DIAGNOSIS — K922 Gastrointestinal hemorrhage, unspecified: Secondary | ICD-10-CM | POA: Diagnosis not present

## 2018-06-14 DIAGNOSIS — N390 Urinary tract infection, site not specified: Secondary | ICD-10-CM | POA: Diagnosis present

## 2018-06-14 DIAGNOSIS — R1013 Epigastric pain: Secondary | ICD-10-CM

## 2018-06-14 DIAGNOSIS — R109 Unspecified abdominal pain: Secondary | ICD-10-CM

## 2018-06-14 DIAGNOSIS — K828 Other specified diseases of gallbladder: Secondary | ICD-10-CM | POA: Diagnosis not present

## 2018-06-14 DIAGNOSIS — M545 Low back pain: Secondary | ICD-10-CM | POA: Diagnosis present

## 2018-06-14 DIAGNOSIS — Z9049 Acquired absence of other specified parts of digestive tract: Secondary | ICD-10-CM | POA: Diagnosis not present

## 2018-06-14 DIAGNOSIS — Z885 Allergy status to narcotic agent status: Secondary | ICD-10-CM

## 2018-06-14 DIAGNOSIS — Z888 Allergy status to other drugs, medicaments and biological substances status: Secondary | ICD-10-CM

## 2018-06-14 DIAGNOSIS — K838 Other specified diseases of biliary tract: Secondary | ICD-10-CM | POA: Diagnosis present

## 2018-06-14 DIAGNOSIS — I209 Angina pectoris, unspecified: Secondary | ICD-10-CM

## 2018-06-14 DIAGNOSIS — Z79899 Other long term (current) drug therapy: Secondary | ICD-10-CM

## 2018-06-14 DIAGNOSIS — G8929 Other chronic pain: Secondary | ICD-10-CM | POA: Diagnosis present

## 2018-06-14 DIAGNOSIS — R748 Abnormal levels of other serum enzymes: Secondary | ICD-10-CM | POA: Diagnosis not present

## 2018-06-14 DIAGNOSIS — R112 Nausea with vomiting, unspecified: Secondary | ICD-10-CM | POA: Diagnosis not present

## 2018-06-14 DIAGNOSIS — M109 Gout, unspecified: Secondary | ICD-10-CM | POA: Diagnosis present

## 2018-06-14 DIAGNOSIS — Z794 Long term (current) use of insulin: Secondary | ICD-10-CM

## 2018-06-14 DIAGNOSIS — N3000 Acute cystitis without hematuria: Secondary | ICD-10-CM | POA: Diagnosis present

## 2018-06-14 DIAGNOSIS — E1122 Type 2 diabetes mellitus with diabetic chronic kidney disease: Secondary | ICD-10-CM | POA: Diagnosis present

## 2018-06-14 DIAGNOSIS — N183 Chronic kidney disease, stage 3 (moderate): Secondary | ICD-10-CM | POA: Diagnosis present

## 2018-06-14 DIAGNOSIS — R7989 Other specified abnormal findings of blood chemistry: Secondary | ICD-10-CM

## 2018-06-14 DIAGNOSIS — R1084 Generalized abdominal pain: Secondary | ICD-10-CM | POA: Diagnosis not present

## 2018-06-14 DIAGNOSIS — M810 Age-related osteoporosis without current pathological fracture: Secondary | ICD-10-CM | POA: Diagnosis present

## 2018-06-14 DIAGNOSIS — F419 Anxiety disorder, unspecified: Secondary | ICD-10-CM | POA: Diagnosis present

## 2018-06-14 DIAGNOSIS — I480 Paroxysmal atrial fibrillation: Secondary | ICD-10-CM | POA: Diagnosis present

## 2018-06-14 DIAGNOSIS — I130 Hypertensive heart and chronic kidney disease with heart failure and stage 1 through stage 4 chronic kidney disease, or unspecified chronic kidney disease: Secondary | ICD-10-CM | POA: Diagnosis present

## 2018-06-14 DIAGNOSIS — Z23 Encounter for immunization: Secondary | ICD-10-CM

## 2018-06-14 DIAGNOSIS — Z883 Allergy status to other anti-infective agents status: Secondary | ICD-10-CM

## 2018-06-14 DIAGNOSIS — Z96653 Presence of artificial knee joint, bilateral: Secondary | ICD-10-CM | POA: Diagnosis present

## 2018-06-14 DIAGNOSIS — Z6841 Body Mass Index (BMI) 40.0 and over, adult: Secondary | ICD-10-CM | POA: Diagnosis not present

## 2018-06-14 DIAGNOSIS — N179 Acute kidney failure, unspecified: Principal | ICD-10-CM | POA: Diagnosis present

## 2018-06-14 DIAGNOSIS — R1011 Right upper quadrant pain: Secondary | ICD-10-CM | POA: Diagnosis present

## 2018-06-14 DIAGNOSIS — Z8673 Personal history of transient ischemic attack (TIA), and cerebral infarction without residual deficits: Secondary | ICD-10-CM | POA: Diagnosis not present

## 2018-06-14 DIAGNOSIS — Z7901 Long term (current) use of anticoagulants: Secondary | ICD-10-CM

## 2018-06-14 DIAGNOSIS — Z950 Presence of cardiac pacemaker: Secondary | ICD-10-CM

## 2018-06-14 DIAGNOSIS — K317 Polyp of stomach and duodenum: Secondary | ICD-10-CM | POA: Diagnosis not present

## 2018-06-14 DIAGNOSIS — K5900 Constipation, unspecified: Secondary | ICD-10-CM | POA: Diagnosis present

## 2018-06-14 DIAGNOSIS — Z9981 Dependence on supplemental oxygen: Secondary | ICD-10-CM

## 2018-06-14 DIAGNOSIS — I4891 Unspecified atrial fibrillation: Secondary | ICD-10-CM | POA: Diagnosis not present

## 2018-06-14 DIAGNOSIS — R0600 Dyspnea, unspecified: Secondary | ICD-10-CM | POA: Diagnosis not present

## 2018-06-14 DIAGNOSIS — R932 Abnormal findings on diagnostic imaging of liver and biliary tract: Secondary | ICD-10-CM | POA: Diagnosis not present

## 2018-06-14 DIAGNOSIS — K760 Fatty (change of) liver, not elsewhere classified: Secondary | ICD-10-CM | POA: Diagnosis present

## 2018-06-14 DIAGNOSIS — R1010 Upper abdominal pain, unspecified: Secondary | ICD-10-CM

## 2018-06-14 DIAGNOSIS — Z881 Allergy status to other antibiotic agents status: Secondary | ICD-10-CM

## 2018-06-14 DIAGNOSIS — K219 Gastro-esophageal reflux disease without esophagitis: Secondary | ICD-10-CM | POA: Diagnosis present

## 2018-06-14 DIAGNOSIS — I509 Heart failure, unspecified: Secondary | ICD-10-CM | POA: Diagnosis present

## 2018-06-14 DIAGNOSIS — Z91041 Radiographic dye allergy status: Secondary | ICD-10-CM

## 2018-06-14 DIAGNOSIS — K259 Gastric ulcer, unspecified as acute or chronic, without hemorrhage or perforation: Secondary | ICD-10-CM | POA: Diagnosis not present

## 2018-06-14 DIAGNOSIS — Z882 Allergy status to sulfonamides status: Secondary | ICD-10-CM

## 2018-06-14 DIAGNOSIS — K56609 Unspecified intestinal obstruction, unspecified as to partial versus complete obstruction: Secondary | ICD-10-CM

## 2018-06-14 DIAGNOSIS — Z91013 Allergy to seafood: Secondary | ICD-10-CM

## 2018-06-14 DIAGNOSIS — Z886 Allergy status to analgesic agent status: Secondary | ICD-10-CM

## 2018-06-14 DIAGNOSIS — Z9109 Other allergy status, other than to drugs and biological substances: Secondary | ICD-10-CM

## 2018-06-14 DIAGNOSIS — Z91048 Other nonmedicinal substance allergy status: Secondary | ICD-10-CM

## 2018-06-14 DIAGNOSIS — R945 Abnormal results of liver function studies: Secondary | ICD-10-CM | POA: Diagnosis present

## 2018-06-14 DIAGNOSIS — Z8719 Personal history of other diseases of the digestive system: Secondary | ICD-10-CM

## 2018-06-14 LAB — GLUCOSE, CAPILLARY
GLUCOSE-CAPILLARY: 136 mg/dL — AB (ref 70–99)
Glucose-Capillary: 141 mg/dL — ABNORMAL HIGH (ref 70–99)
Glucose-Capillary: 44 mg/dL — CL (ref 70–99)
Glucose-Capillary: 54 mg/dL — ABNORMAL LOW (ref 70–99)

## 2018-06-14 LAB — URINALYSIS, COMPLETE (UACMP) WITH MICROSCOPIC
BILIRUBIN URINE: NEGATIVE
Glucose, UA: 150 mg/dL — AB
Hgb urine dipstick: NEGATIVE
KETONES UR: NEGATIVE mg/dL
Nitrite: NEGATIVE
PROTEIN: NEGATIVE mg/dL
Specific Gravity, Urine: 1.006 (ref 1.005–1.030)
WBC, UA: >50 WBC/hpf — ABNORMAL HIGH (ref 0–5)
pH: 7 (ref 5.0–8.0)

## 2018-06-14 LAB — COMPREHENSIVE METABOLIC PANEL
ALBUMIN: 3.6 g/dL (ref 3.5–5.0)
ALK PHOS: 128 U/L — AB (ref 38–126)
ALT: 14 U/L (ref 0–44)
ANION GAP: 14 (ref 5–15)
AST: 47 U/L — ABNORMAL HIGH (ref 15–41)
BILIRUBIN TOTAL: 1.3 mg/dL — AB (ref 0.3–1.2)
BUN: 20 mg/dL (ref 8–23)
CO2: 25 mmol/L (ref 22–32)
Calcium: 9.3 mg/dL (ref 8.9–10.3)
Chloride: 92 mmol/L — ABNORMAL LOW (ref 98–111)
Creatinine, Ser: 1.91 mg/dL — ABNORMAL HIGH (ref 0.44–1.00)
GFR calc non Af Amer: 24 mL/min — ABNORMAL LOW (ref >60)
GFR, EST AFRICAN AMERICAN: 28 mL/min — AB (ref >60)
Glucose, Bld: 249 mg/dL — ABNORMAL HIGH (ref 70–99)
POTASSIUM: 4.5 mmol/L (ref 3.5–5.1)
SODIUM: 131 mmol/L — AB (ref 135–145)
Total Protein: 7.4 g/dL (ref 6.5–8.1)

## 2018-06-14 LAB — CBC
HCT: 35.1 % (ref 35.0–47.0)
HEMOGLOBIN: 12.3 g/dL (ref 12.0–16.0)
MCH: 31.8 pg (ref 26.0–34.0)
MCHC: 35 g/dL (ref 32.0–36.0)
MCV: 91.1 fL (ref 80.0–100.0)
Platelets: 247 10*3/uL (ref 150–440)
RBC: 3.85 MIL/uL (ref 3.80–5.20)
RDW: 14.4 % (ref 11.5–14.5)
WBC: 6.1 10*3/uL (ref 3.6–11.0)

## 2018-06-14 LAB — APTT: APTT: 31 s (ref 24–36)

## 2018-06-14 LAB — PROTIME-INR
INR: 1.28
Prothrombin Time: 15.9 seconds — ABNORMAL HIGH (ref 11.4–15.2)

## 2018-06-14 LAB — LIPASE, BLOOD: Lipase: 20 U/L (ref 11–51)

## 2018-06-14 MED ORDER — PAROXETINE HCL 20 MG PO TABS
40.0000 mg | ORAL_TABLET | ORAL | Status: DC
  Administered 2018-06-15 – 2018-06-21 (×6): 40 mg via ORAL
  Filled 2018-06-14 (×8): qty 2

## 2018-06-14 MED ORDER — PRAVASTATIN SODIUM 20 MG PO TABS
20.0000 mg | ORAL_TABLET | Freq: Every day | ORAL | Status: DC
  Administered 2018-06-14 – 2018-06-21 (×8): 20 mg via ORAL
  Filled 2018-06-14 (×9): qty 1

## 2018-06-14 MED ORDER — INSULIN ASPART 100 UNIT/ML ~~LOC~~ SOLN
0.0000 [IU] | Freq: Three times a day (TID) | SUBCUTANEOUS | Status: DC
  Administered 2018-06-15: 3 [IU] via SUBCUTANEOUS
  Administered 2018-06-15: 2 [IU] via SUBCUTANEOUS
  Administered 2018-06-15: 3 [IU] via SUBCUTANEOUS
  Administered 2018-06-16: 5 [IU] via SUBCUTANEOUS
  Administered 2018-06-16 (×2): 3 [IU] via SUBCUTANEOUS
  Administered 2018-06-17: 5 [IU] via SUBCUTANEOUS
  Administered 2018-06-17: 1 [IU] via SUBCUTANEOUS
  Administered 2018-06-17: 2 [IU] via SUBCUTANEOUS
  Administered 2018-06-18: 1 [IU] via SUBCUTANEOUS
  Administered 2018-06-18: 2 [IU] via SUBCUTANEOUS
  Administered 2018-06-18: 3 [IU] via SUBCUTANEOUS
  Administered 2018-06-19 (×2): 2 [IU] via SUBCUTANEOUS
  Administered 2018-06-19: 3 [IU] via SUBCUTANEOUS
  Administered 2018-06-20: 2 [IU] via SUBCUTANEOUS
  Administered 2018-06-20 (×2): 3 [IU] via SUBCUTANEOUS
  Administered 2018-06-21: 2 [IU] via SUBCUTANEOUS
  Administered 2018-06-21 (×2): 5 [IU] via SUBCUTANEOUS
  Filled 2018-06-14 (×22): qty 1

## 2018-06-14 MED ORDER — BUSPIRONE HCL 10 MG PO TABS
10.0000 mg | ORAL_TABLET | Freq: Two times a day (BID) | ORAL | Status: DC
  Administered 2018-06-14 – 2018-06-21 (×15): 10 mg via ORAL
  Filled 2018-06-14 (×16): qty 1

## 2018-06-14 MED ORDER — DIPHENHYDRAMINE HCL 50 MG/ML IJ SOLN
25.0000 mg | Freq: Once | INTRAMUSCULAR | Status: AC
  Administered 2018-06-14: 25 mg via INTRAVENOUS
  Filled 2018-06-14: qty 1

## 2018-06-14 MED ORDER — INSULIN ASPART 100 UNIT/ML ~~LOC~~ SOLN
0.0000 [IU] | Freq: Every day | SUBCUTANEOUS | Status: DC
  Administered 2018-06-15: 5 [IU] via SUBCUTANEOUS
  Administered 2018-06-16 – 2018-06-21 (×4): 2 [IU] via SUBCUTANEOUS
  Filled 2018-06-14 (×5): qty 1

## 2018-06-14 MED ORDER — INSULIN ASPART PROT & ASPART (70-30 MIX) 100 UNIT/ML ~~LOC~~ SUSP
17.0000 [IU] | Freq: Two times a day (BID) | SUBCUTANEOUS | Status: DC
  Administered 2018-06-14: 17 [IU] via SUBCUTANEOUS
  Filled 2018-06-14: qty 10

## 2018-06-14 MED ORDER — DOCUSATE SODIUM 100 MG PO CAPS
100.0000 mg | ORAL_CAPSULE | Freq: Every day | ORAL | Status: DC
  Administered 2018-06-14 – 2018-06-17 (×4): 100 mg via ORAL
  Filled 2018-06-14 (×4): qty 1

## 2018-06-14 MED ORDER — SUCRALFATE 1 GM/10ML PO SUSP
1.0000 g | Freq: Four times a day (QID) | ORAL | Status: DC
  Administered 2018-06-14 – 2018-06-21 (×26): 1 g via ORAL
  Filled 2018-06-14 (×26): qty 10

## 2018-06-14 MED ORDER — LORAZEPAM 2 MG/ML IJ SOLN
0.5000 mg | Freq: Once | INTRAMUSCULAR | Status: AC
  Administered 2018-06-14: 0.5 mg via INTRAVENOUS
  Filled 2018-06-14: qty 1

## 2018-06-14 MED ORDER — AMIODARONE HCL 200 MG PO TABS
200.0000 mg | ORAL_TABLET | Freq: Two times a day (BID) | ORAL | Status: DC
  Administered 2018-06-15 – 2018-06-21 (×14): 200 mg via ORAL
  Filled 2018-06-14 (×14): qty 1

## 2018-06-14 MED ORDER — IPRATROPIUM-ALBUTEROL 0.5-2.5 (3) MG/3ML IN SOLN
3.0000 mL | Freq: Two times a day (BID) | RESPIRATORY_TRACT | Status: DC
  Administered 2018-06-14 – 2018-06-21 (×15): 3 mL via RESPIRATORY_TRACT
  Filled 2018-06-14 (×16): qty 3

## 2018-06-14 MED ORDER — SODIUM CHLORIDE 0.9 % IV SOLN
INTRAVENOUS | Status: DC
  Administered 2018-06-14 – 2018-06-15 (×2): via INTRAVENOUS

## 2018-06-14 MED ORDER — DEXTROSE 50 % IV SOLN
25.0000 mL | Freq: Once | INTRAVENOUS | Status: AC
  Administered 2018-06-14: 25 mL via INTRAVENOUS

## 2018-06-14 MED ORDER — ACETAMINOPHEN 325 MG PO TABS: 650.0000 mg | ORAL_TABLET | Freq: Four times a day (QID) | ORAL | Status: DC | PRN

## 2018-06-14 MED ORDER — ONDANSETRON HCL 4 MG/2ML IJ SOLN
4.0000 mg | Freq: Once | INTRAMUSCULAR | Status: AC
  Administered 2018-06-14: 4 mg via INTRAVENOUS
  Filled 2018-06-14: qty 2

## 2018-06-14 MED ORDER — ACETAMINOPHEN 650 MG RE SUPP: 650.0000 mg | Freq: Four times a day (QID) | RECTAL | Status: DC | PRN

## 2018-06-14 MED ORDER — INFLUENZA VAC SPLIT HIGH-DOSE 0.5 ML IM SUSY
0.5000 mL | PREFILLED_SYRINGE | INTRAMUSCULAR | Status: AC
  Administered 2018-06-15: 0.5 mL via INTRAMUSCULAR
  Filled 2018-06-14: qty 0.5

## 2018-06-14 MED ORDER — ALBUTEROL SULFATE (2.5 MG/3ML) 0.083% IN NEBU: 2.5000 mg | INHALATION_SOLUTION | RESPIRATORY_TRACT | Status: DC | PRN

## 2018-06-14 MED ORDER — ALLOPURINOL 100 MG PO TABS
150.0000 mg | ORAL_TABLET | Freq: Every day | ORAL | Status: DC
  Administered 2018-06-15 – 2018-06-21 (×6): 150 mg via ORAL
  Filled 2018-06-14 (×8): qty 2

## 2018-06-14 MED ORDER — COLCHICINE 0.6 MG PO TABS: 0.6000 mg | ORAL_TABLET | Freq: Every day | ORAL | Status: DC | PRN

## 2018-06-14 MED ORDER — SUCRALFATE 1 GM/10ML PO SUSP: 1.0000 g | Freq: Four times a day (QID) | ORAL | 1 refills | Status: DC

## 2018-06-14 MED ORDER — HYDROCODONE-ACETAMINOPHEN 5-325 MG PO TABS
1.0000 | ORAL_TABLET | ORAL | Status: DC | PRN
  Administered 2018-06-15: 1 via ORAL
  Administered 2018-06-15 – 2018-06-19 (×12): 2 via ORAL
  Administered 2018-06-21: 1 via ORAL
  Filled 2018-06-14 (×9): qty 2
  Filled 2018-06-14: qty 1
  Filled 2018-06-14 (×3): qty 2
  Filled 2018-06-14 (×2): qty 1
  Filled 2018-06-14 (×2): qty 2

## 2018-06-14 MED ORDER — CIPROFLOXACIN IN D5W 400 MG/200ML IV SOLN
400.0000 mg | Freq: Two times a day (BID) | INTRAVENOUS | Status: DC
  Administered 2018-06-15: 400 mg via INTRAVENOUS
  Filled 2018-06-14: qty 200

## 2018-06-14 MED ORDER — DEXTROSE 50 % IV SOLN
INTRAVENOUS | Status: AC
  Administered 2018-06-14: 23:00:00
  Filled 2018-06-14: qty 50

## 2018-06-14 MED ORDER — POLYETHYLENE GLYCOL 3350 17 G PO PACK: 17.0000 g | PACK | Freq: Every day | ORAL | Status: DC | PRN

## 2018-06-14 MED ORDER — CIPROFLOXACIN IN D5W 400 MG/200ML IV SOLN
400.0000 mg | Freq: Once | INTRAVENOUS | Status: AC
  Administered 2018-06-14: 400 mg via INTRAVENOUS
  Filled 2018-06-14: qty 200

## 2018-06-14 MED ORDER — ENOXAPARIN SODIUM 40 MG/0.4ML ~~LOC~~ SOLN: 30.0000 mg | SUBCUTANEOUS | Status: DC

## 2018-06-14 MED ORDER — ONDANSETRON HCL 4 MG/2ML IJ SOLN
4.0000 mg | Freq: Four times a day (QID) | INTRAMUSCULAR | Status: DC | PRN
  Administered 2018-06-15 – 2018-06-19 (×5): 4 mg via INTRAVENOUS
  Filled 2018-06-14 (×5): qty 2

## 2018-06-14 MED ORDER — APIXABAN 5 MG PO TABS
5.0000 mg | ORAL_TABLET | Freq: Two times a day (BID) | ORAL | Status: DC
  Administered 2018-06-14 – 2018-06-16 (×4): 5 mg via ORAL
  Filled 2018-06-14 (×5): qty 1

## 2018-06-14 MED ORDER — SENNOSIDES-DOCUSATE SODIUM 8.6-50 MG PO TABS
1.0000 | ORAL_TABLET | Freq: Every morning | ORAL | Status: DC
  Administered 2018-06-15 – 2018-06-18 (×3): 1 via ORAL
  Filled 2018-06-14 (×4): qty 1

## 2018-06-14 MED ORDER — ONDANSETRON HCL 4 MG PO TABS: 4.0000 mg | ORAL_TABLET | Freq: Four times a day (QID) | ORAL | Status: DC | PRN

## 2018-06-14 MED ORDER — DIPHENHYDRAMINE HCL 25 MG PO CAPS
25.0000 mg | ORAL_CAPSULE | Freq: Every day | ORAL | Status: DC
  Administered 2018-06-15 – 2018-06-17 (×3): 25 mg via ORAL
  Filled 2018-06-14 (×3): qty 1

## 2018-06-14 MED ORDER — PANTOPRAZOLE SODIUM 40 MG PO TBEC
40.0000 mg | DELAYED_RELEASE_TABLET | Freq: Two times a day (BID) | ORAL | Status: DC
  Administered 2018-06-14 – 2018-06-21 (×15): 40 mg via ORAL
  Filled 2018-06-14 (×16): qty 1

## 2018-06-14 MED ORDER — OXYBUTYNIN CHLORIDE 5 MG PO TABS
2.5000 mg | ORAL_TABLET | Freq: Every day | ORAL | Status: DC
  Administered 2018-06-14 – 2018-06-21 (×8): 2.5 mg via ORAL
  Filled 2018-06-14 (×8): qty 1

## 2018-06-14 MED ORDER — SODIUM CHLORIDE 0.9 % IV SOLN
Freq: Once | INTRAVENOUS | Status: AC
  Administered 2018-06-14: 14:00:00 via INTRAVENOUS

## 2018-06-14 MED ORDER — INSULIN ASPART 100 UNIT/ML ~~LOC~~ SOLN
0.0000 [IU] | Freq: Three times a day (TID) | SUBCUTANEOUS | Status: DC
  Administered 2018-06-14: 2 [IU] via SUBCUTANEOUS
  Filled 2018-06-14: qty 1

## 2018-06-14 MED ORDER — POLYETHYLENE GLYCOL 3350 17 G PO PACK
17.0000 g | PACK | Freq: Every day | ORAL | Status: DC
  Administered 2018-06-15 – 2018-06-18 (×3): 17 g via ORAL
  Filled 2018-06-14 (×3): qty 1

## 2018-06-14 MED ORDER — MORPHINE SULFATE (PF) 4 MG/ML IV SOLN
4.0000 mg | Freq: Once | INTRAVENOUS | Status: AC
  Administered 2018-06-14: 4 mg via INTRAVENOUS
  Filled 2018-06-14: qty 1

## 2018-06-14 NOTE — ED Notes (Signed)
Patient transported to room 206 

## 2018-06-14 NOTE — ED Provider Notes (Signed)
[facility] Emergency Department Provider Note       Time seen: ----------------------------------------- 12:40 PM on 06/14/2018 -----------------------------------------   I have reviewed the triage vital signs and the nursing notes.  HISTORY   Chief Complaint Abdominal Pain    HPI Deborah Obrien is a 77 y.o. female with a history of atrial fibrillation, anxiety, small bowel obstruction, CHF, chronic kidney disease, diabetes, osteoarthritis, TIA who presents to the ED for tendon obtain abdominal pain.  Patient states she was previously admitted for gastrointestinal bleeding.  Today she is having right upper quadrant pain and vomiting.  She does have a history of cholecystectomy she was given fentanyl in route with some improvement in her pain.  Past Medical History:  Diagnosis Date  . A-fib ([place]) 2016  . Acid reflux   . Anxiety   . Bowel obstruction ([place])   . CHF (congestive heart failure) ([place]) 2017  . Chronic kidney disease (CKD) stage G3a/A1, moderately decreased glomerular filtration rate (GFR) between 45-59 mL/min/1.73 square meter and albuminuria creatinine ratio less than 30 mg/g (HCC)   . Diabetes mellitus without complication ([place])   . Dyspnea   . Fatty liver   . GI bleed   . Gout   . Hypertension   . Morbid obesity ([place])   . Osteoarthritis   . Osteoporosis   . Paroxysmal atrial fibrillation (HCC)   . Presence of permanent cardiac pacemaker   . Sinus node dysfunction ([place]) 08/24/2017  . TIA (transient ischemic attack)     Patient Active Problem List   Diagnosis Date Noted  . Type 2 diabetes mellitus with neurological complications ([place]) 25/63/8937  . Acute respiratory failure ([place]) 04/09/2018  . Chronic anticoagulation 04/09/2018  . Pacemaker 04/09/2018  . H/O TIA (transient ischemic attack) and stroke 04/09/2018  . CAD (coronary artery disease) 04/09/2018  . Hematemesis 04/05/2018  . Hypotension 02/04/2018  . Atrial  fibrillation with RVR ([place]) 02/04/2018  . Atrial flutter ([place]) 08/24/2017  . Sinus node dysfunction ([place]) 08/24/2017  . Dizziness 06/12/2017  . Mastalgia 02/14/2017  . Acute on chronic renal insufficiency 01/09/2017  . Fatty liver 01/09/2017  . Benign essential HTN 09/08/2016  . Nephrolithiasis 02/22/2016  . Diabetic neuropathy with neurologic complication ([place]) 34/28/7681  . Moderate episode of recurrent major depressive disorder ([place]) 08/11/2015  . Gastritis without bleeding   . Gastric polyp   . Gastro-esophageal reflux disease without esophagitis   . PAF (paroxysmal atrial fibrillation) ([place]) 06/29/2015  . Intestinal obstruction ([place]) 02/20/2015  . DDD (degenerative disc disease), lumbar 01/19/2015  . Back pain 01/12/2015  . Gout 01/12/2015  . Complete tear of left rotator cuff 10/16/2014  . Intervertebral disc disorder with radiculopathy of lumbar region 09/17/2014  . Lumbar radiculitis 09/17/2014  . Lumbar stenosis with neurogenic claudication 09/17/2014  . Neuritis or radiculitis due to rupture of lumbar intervertebral disc 09/17/2014  . Atherosclerosis of both carotid arteries 07/15/2014  . Essential hypertension 07/15/2014  . Hyperlipemia, mixed 07/15/2014  . 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 Body mass index (BMI) of 40.0-44.9 in adult ([place]) 06/21/2014  . Mixed incontinence 06/25/2013  . Mixed urge and stress incontinence 06/25/2013  . Nocturia 06/25/2013  . Blurring of visual image 04/11/2012    Past Surgical History:  Procedure Laterality Date  . ABDOMINAL HYSTERECTOMY  1980  . APPENDECTOMY    . BACK SURGERY     2008  . BREAST BIOPSY Right 08/16   stereo fibroadenomatous change, neg for atypia  . BREAST EXCISIONAL  BIOPSY Left    neg  . CARDIAC CATHETERIZATION Left 08/25/2016   Procedure: Left Heart Cath and Coronary Angiography;  Surgeon: Corey Skains, MD;  Location: [facility];  Service: Cardiovascular;  Laterality: Left;  .  CARDIOVERSION N/A 06/28/2017   Procedure: CARDIOVERSION;  Surgeon: Corey Skains, MD;  Location: [facility];  Service: Cardiovascular;  Laterality: N/A;  . CARDIOVERSION N/A 02/06/2018   Procedure: CARDIOVERSION;  Surgeon: Josue Hector, MD;  Location: [facility];  Service: Cardiovascular;  Laterality: N/A;  . CATARACT EXTRACTION, BILATERAL  2012  . CHOLECYSTECTOMY    . colon blockage  1999  . COLON SURGERY  1999  . COLONOSCOPY  2016   polyps removed 2016  . DORSAL COMPARTMENT RELEASE Left 09/14/2016   Procedure: RELEASE DORSAL COMPARTMENT ([place]);  Surgeon: Dereck Leep, MD;  Location: [facility];  Service: Orthopedics;  Laterality: Left;  . ESOPHAGOGASTRODUODENOSCOPY N/A 01/27/2015   Procedure: ESOPHAGOGASTRODUODENOSCOPY (EGD);  Surgeon: Lollie Sails, MD;  Location[name];  Service: Endoscopy;  Laterality: N/A;  . ESOPHAGOGASTRODUODENOSCOPY (EGD) WITH PROPOFOL N/A 07/24/2015   Procedure: ESOPHAGOGASTRODUODENOSCOPY (EGD) WITH PROPOFOL;  Surgeon: Lucilla Lame, MD;  Location: [facility] ENDOSCOPY;  Service: Endoscopy;  Laterality: N/A;  . ESOPHAGOGASTRODUODENOSCOPY (EGD) WITH PROPOFOL N/A 04/12/2018   Procedure: ESOPHAGOGASTRODUODENOSCOPY (EGD) WITH PROPOFOL;  Surgeon: Jonathon Bellows, MD;  Location: [facility] ENDOSCOPY;  Service: Gastroenterology;  Laterality: N/A;  . JOINT REPLACEMENT  2014   Bilateral Knee replacement  . LEAD REVISION/REPAIR N/A 08/29/2017   Procedure: LEAD REVISION/REPAIR;  Surgeon: Constance Haw, MD;  Location: [place] [facility];  Service: Cardiovascular;  Laterality: N/A;  . PACEMAKER INSERTION Left 07/01/2015   Procedure: INSERTION PACEMAKER;  Surgeon: Isaias Cowman, MD;  Location: [facility];  Service: Cardiovascular;  Laterality: Left;  . PACEMAKER REVISION N/A 08/28/2017   Procedure: PACEMAKER REVISION;  Surgeon: Deboraha Sprang, MD;  Location: [place] [facility];  Service: Cardiovascular;  Laterality: N/A;  . REPLACEMENT TOTAL KNEE BILATERAL    .  SHOULDER ARTHROSCOPY WITH SUBACROMIAL DECOMPRESSION Left 2013  . TEE WITHOUT CARDIOVERSION N/A 06/28/2017   Procedure: TRANSESOPHAGEAL ECHOCARDIOGRAM (TEE);  Surgeon: Corey Skains, MD;  Location: [facility];  Service: Cardiovascular;  Laterality: N/A;    Allergies Cephalosporins; Fish allergy; Macrolides and ketolides; Meperidine; Other; Prednisone; Shellfish allergy; Sulfa antibiotics; Sulfacetamide sodium; Telbivudine; Uloric [febuxostat]; Aspirin; Celecoxib; Cephalexin; Erythromycin; Hydromorphone; Iodinated diagnostic agents; Oxycodone; Atorvastatin; Codeine; Doxycycline; Tape; Valdecoxib; and Iodine  Social History Social History   Tobacco Use  . Smoking status: Never Smoker  . Smokeless tobacco: Never Used  Substance Use Topics  . Alcohol use: No  . Drug use: No   Review of Systems Constitutional: Negative for fever. Cardiovascular: Negative for chest pain. Respiratory: Negative for shortness of breath. Gastrointestinal: Positive for abdominal pain, vomiting Musculoskeletal: Positive for recent back pain Skin: Negative for rash. Neurological: Negative for headaches, focal weakness or numbness.  All systems negative/normal/unremarkable except as stated in the HPI  ____________________________________________   PHYSICAL EXAM:  VITAL SIGNS: ED Triage Vitals  Enc Vitals Group     BP 06/14/18 1223 (!) 110/96     Pulse Rate 06/14/18 1223 60     Resp 06/14/18 1223 (!) 24     Temp 06/14/18 1223 98.3 F (36.8 C)     Temp Source 06/14/18 1223 Oral     SpO2 06/14/18 1223 99 %     Weight 06/14/18 1221 225 lb (102.1 kg)     Height 06/14/18 1221 5'  4" (1.626 m)     Head Circumference --      Peak Flow --      Pain Score 06/14/18 1221 10     Pain Loc --      Pain Edu? --      Excl. in [place]? --    Constitutional: Alert and oriented.  Anxious, mild distress Eyes: Conjunctivae are normal. Normal extraocular movements. ENT   Head: Normocephalic and atraumatic.    Nose: No congestion/rhinnorhea.   Mouth/Throat: Mucous membranes are moist.   Neck: No stridor. Cardiovascular: Normal rate, regular rhythm. No murmurs, rubs, or gallops. Respiratory: Normal respiratory effort without tachypnea nor retractions. Breath sounds are clear and equal bilaterally. No wheezes/rales/rhonchi. Gastrointestinal: Diffuse upper abdominal tenderness, no rebound or guarding.  Normal bowel sounds. Musculoskeletal: Nontender with normal range of motion in extremities. No lower extremity tenderness nor edema. Neurologic:  Normal speech and language. No gross focal neurologic deficits are appreciated.  Skin:  Skin is warm, dry and intact. No rash noted. Psychiatric: Mood and affect are normal. Speech and behavior are normal.  ____________________________________________  EKG: Interpreted by me.  Atrial paced rhythm with a prolonged AV conduction, rate of 70 bpm  ____________________________________________  ED COURSE:  As part of my medical decision making, I reviewed the following data within the [place] History obtained from family if available, nursing notes, old chart and ekg, as well as notes from prior ED visits. Patient presented for abdominal pain and vomiting, we will assess with labs and imaging as indicated at this time. Clinical Course as of Jun 14 1456  Thu Jun 14, 2018  1428 WBC, UA(!): >50 [JW]    Clinical Course User Index [JW] Earleen Newport, MD   Procedures ____________________________________________   LABS (pertinent positives/negatives)  Labs Reviewed  COMPREHENSIVE METABOLIC PANEL - Abnormal; Notable for the following components:      Result Value   Sodium 131 (*)    Chloride 92 (*)    Glucose, Bld 249 (*)    Creatinine, Ser 1.91 (*)    AST 47 (*)    Alkaline Phosphatase 128 (*)    Total Bilirubin 1.3 (*)    GFR calc non Af Amer 24 (*)    GFR calc Af Amer 28 (*)    All other components within normal limits   URINALYSIS, COMPLETE (UACMP) WITH MICROSCOPIC - Abnormal; Notable for the following components:   Color, Urine YELLOW (*)    APPearance CLEAR (*)    Glucose, UA 150 (*)    Leukocytes, UA LARGE (*)    WBC, UA >50 (*)    Bacteria, UA RARE (*)    All other components within normal limits  PROTIME-INR - Abnormal; Notable for the following components:   Prothrombin Time 15.9 (*)    All other components within normal limits  LIPASE, BLOOD  CBC  APTT    RADIOLOGY Images were viewed by me  CT the abdomen pelvis without contrast IMPRESSION: 1. No evident bowel obstruction. No abscess in the abdomen or pelvis. Postoperative clips noted in the pelvis. Appendix absent.  2. No renal or ureteral calculus on either side. No hydronephrosis.  3.  Atrophic pancreas.  No focal pancreatic lesions.  4.  Gallbladder absent.  Uterus absent.  5. Multilevel postoperative change in the lumbar spine. Status post kyphoplasty procedures at T12 and L1.  6.  Aortoiliac atherosclerosis.  Aortic Atherosclerosis (ICD10-I70.0). ____________________________________________  DIFFERENTIAL DIAGNOSIS   Obstruction, dehydration, electrolyte abnormality, UTI,  GERD, peptic ulcer disease, chronic pain  FINAL ASSESSMENT AND PLAN  Abdominal pain, vomiting, urinary tract infection   Plan: The patient had presented for abdominal pain and vomiting. Patient's labs did reveal slight increase in her creatinine or AKI compared to prior.  We started IV fluid hydration.  Patient's imaging did not reveal any acute process.  Patient states she does not really feel any better and does not want to go home.  She also has a urinary tract infection and was Artie taking Macrobid.  I will order IV Cipro.  I will discuss with hospitalist for admission.   Laurence Aly, MD   Note: This note was generated in part or whole with voice recognition software. Voice recognition is usually quite accurate but there are  transcription errors that can and very often do occur. I apologize for any typographical errors that were not detected and corrected.     Earleen Newport, MD 06/14/18 [phone]

## 2018-06-14 NOTE — Progress Notes (Signed)
Called Dr. Jerelyn Charles regarding patient's blood pressure of 105/35 on monitor.  I rechecked blood pressure manually and it was 90/40.  Instructed to hold blood pressure medication and monitor patient.  I spoke to patient's husband to calm his fears regarding patient's blood pressure.  Christene Slates  06/14/2018  9:12 PM

## 2018-06-14 NOTE — ED Notes (Signed)
Pt's husband requesting to go to a room prior to Charge RN calling and patient being roomed in 76. This RN explained that patient would go to subwait but unfortunately no rooms available at that time. Pt's husband stated that he "cannot wait to get that questionaire and that is the problem [he] with this hospital." This Rn apologized and explained that while the GI doctor called and spoke with Charge RN, we had our own ER protocols to follow. Pt then began yelling at this RN and her husband, "Shut up, just shut up, I don't want to hear another word out of either one of you unless it's life threatening". While leaving triage room pt could be heard saying "next time honey we will go to [facility]".

## 2018-06-14 NOTE — Progress Notes (Addendum)
Jonathon Bellows MD, MRCP(U.K) [address]  [place]  [place], [place] [address]  Main: [phone]  Fax: [phone]   Primary Care Physician: [name], MD  Primary Gastroenterologist:  Dr. Jonathon Bellows   No chief complaint on file.   HPI: Deborah Obrien is a 77 y.o. female   Summary of history :  I was consulted to see her on 04/05/18 when she was admitted with nausea,vomiting and found to have small bowel obstruction and was seen by surgery at that time. She had a upper endoscopy by Dr Allen Norris in 11/ 2016 for abdominal pain and had multiple gastric polyps that were biopsied and were benign. The patient has been on Eliquis that was started by her cardiologist. The patient had an NG tube placed with dark material coming out of the NG tube. The patient's baseline hemoglobin had been around 13.5 and dropped to 9.6.I performed an EGD on 04/12/18 and noted a lare gastric polyp that was oozing . It was treated with epinephrine and suseqeuntly sprayed with hemospray and seemed to stop the bleeding.      Interval history  05/08/18-06/14/18  She called the office yesterday with complaints of diarrhea and abdominal pain .   Last 3-4 days have been having Right sided pain . She had back surgery on Friday . Cant eat , does not have apetite, gets sick when she eats.  Started antibiotics for an UTI yesterday .   Not on any NSAID's, taking miralax. Having good bowel movements, only 1 watery bowel movement yesterday , no diarrhea , brown stool. Back on eloquis. Says eating makes pain worse.   Taking Pantoprazole BID. She has never had this kind of pain before . Gall bladder has been taken off.   Also had her appendix , part of her colon taken out for constipation .     brown stool , on eloquis, in a wheel chair, cant lay flat at night, on oxygen . Feels short of breath. No other GI complaints.       Current Outpatient Medications  Medication Sig Dispense Refill  .  acetaminophen (TYLENOL) 325 MG tablet Take 2 tablets (650 mg total) by mouth every 6 (six) hours as needed for mild pain (or Fever >/= 101).    Marland Kitchen allopurinol (ZYLOPRIM) 100 MG tablet Take 150 mg by mouth daily.     Marland Kitchen amiodarone (PACERONE) 200 MG tablet Take 1 tablet (200 mg total) by mouth 2 (two) times daily. 60 tablet 3  . apixaban (ELIQUIS) 5 MG TABS tablet Take 1 tablet (5 mg total) by mouth 2 (two) times daily. 180 tablet 3  . benzocaine-Menthol (DERMOPLAST) 20-0.5 % AERO Apply 1 application topically 4 (four) times daily as needed for irritation.    . bisacodyl (DULCOLAX) 10 MG suppository Place 1 suppository (10 mg total) rectally daily as needed for moderate constipation. 12 suppository 0  . busPIRone (BUSPAR) 10 MG tablet Take 10 mg by mouth 2 (two) times daily.     . cholecalciferol (VITAMIN D) 1000 units tablet Take 1,000 Units by mouth daily.    . colchicine 0.6 MG tablet Take 0.6 mg by mouth daily as needed (gout).     . dibucaine (NUPERCAINAL) 1 % OINT Place 1 application rectally as needed for hemorrhoids.  0  . diphenhydrAMINE (BENADRYL) 25 mg capsule Take 1 capsule (25 mg total) by mouth at bedtime. 30 capsule 0  . docusate sodium (COLACE) 100 MG capsule Take 100 mg by  mouth at bedtime.     . ferrous sulfate 325 (65 FE) MG EC tablet Take 1 tablet (325 mg total) by mouth 2 (two) times daily. 60 tablet 3  . furosemide (LASIX) 40 MG tablet Take 1 tablet (40 mg total) by mouth 2 (two) times daily. 30 tablet   . HYDROcodone-acetaminophen (NORCO/VICODIN) 5-325 MG tablet Take 1 tablet by mouth every 6 (six) hours as needed for moderate pain. 120 tablet 0  . hydrocortisone 2.5 % cream Apply 1 application topically daily as needed (rash).    . insulin NPH-regular Human (NOVOLIN 70/30) (70-30) 100 UNIT/ML injection Inject 22 Units into the skin 2 (two) times daily.     Marland Kitchen ipratropium-albuterol (DUONEB) 0.5-2.5 (3) MG/3ML SOLN Take 3 mLs by nebulization 2 (two) times daily. 360 mL   .  lidocaine (LIDODERM) 5 % Place 1 patch onto the skin daily. Remove & Discard patch within 12 hours or as directed by MD 30 patch 0  . Multiple Vitamin (MULTIVITAMIN WITH MINERALS) TABS tablet Take 1 tablet by mouth daily.    Marland Kitchen neomycin-bacitracin-polymyxin (NEOSPORIN) ointment Apply topically as needed for wound care. 15 g 0  . NON FORMULARY Diet Type: Mechanical Soft Chopped    . oxybutynin (DITROPAN-XL) 5 MG 24 hr tablet Take 2.5 mg by mouth at bedtime.    . OXYGEN Inhale 2 L into the lungs continuous.    . pantoprazole (PROTONIX) 40 MG tablet Take 1 tablet (40 mg total) by mouth 2 (two) times daily. 60 tablet 0  . PARoxetine (PAXIL) 40 MG tablet Take 40 mg by mouth every morning.    Vladimir Faster Glycol-Propyl Glycol (SYSTANE) 0.4-0.3 % SOLN Place 1 drop into both eyes 2 (two) times daily as needed.    . polyethylene glycol (MIRALAX / GLYCOLAX) packet Take 17 g by mouth daily. 14 each 0  . pravastatin (PRAVACHOL) 20 MG tablet Take 20 mg by mouth at bedtime.     . senna-docusate (SENOKOT-S) 8.6-50 MG tablet Take 1 tablet by mouth every morning.    . talc (ZEASORB) powder Apply 1 application topically as needed (under the breast irritation).     No current facility-administered medications for this visit.     Allergies as of 06/14/2018 - Review Complete 05/22/2018  Allergen Reaction Noted  . Cephalosporins Other (See Comments) 01/19/2015  . Fish allergy Shortness Of Breath 11/26/2015  . Macrolides and ketolides Other (See Comments) 01/12/2015  . Meperidine Shortness Of Breath, Nausea Only, and Other (See Comments) 01/19/2015  . Other Nausea Only, Rash, and Other (See Comments) 01/12/2015  . Prednisone Anaphylaxis and Other (See Comments) 01/19/2015  . Shellfish allergy Anaphylaxis, Shortness Of Breath, Diarrhea, Nausea Only, and Rash 06/29/2015  . Sulfa antibiotics Shortness Of Breath, Diarrhea, Nausea Only, and Other (See Comments) 06/25/2013  . Sulfacetamide sodium Diarrhea, Nausea Only,  Other (See Comments), and Shortness Of Breath 08/12/2015  . Telbivudine  08/12/2015  . Uloric [febuxostat] Anaphylaxis 08/22/2016  . Aspirin Other (See Comments) 01/19/2015  . Celecoxib Other (See Comments) 01/19/2015  . Cephalexin Diarrhea, Nausea Only, and Other (See Comments) 01/19/2015  . Erythromycin Diarrhea, Nausea Only, and Other (See Comments) 01/19/2015  . Hydromorphone Other (See Comments) 01/19/2015  . Iodinated diagnostic agents Rash and Other (See Comments) 01/19/2015  . Oxycodone Other (See Comments) 01/19/2015  . Atorvastatin Nausea Only and Other (See Comments) 01/19/2015  . Codeine Diarrhea, Nausea Only, and Other (See Comments) 01/19/2015  . Doxycycline  08/24/2017  . Tape Other (See Comments) 06/29/2015  .  Valdecoxib Swelling and Other (See Comments) 01/19/2015  . Iodine Rash 06/29/2015    ROS:  General: Negative for anorexia, weight loss, fever, chills, fatigue, weakness. ENT: Negative for hoarseness, difficulty swallowing , nasal congestion. CV: Negative for chest pain, angina, palpitations, dyspnea on exertion, peripheral edema.  Respiratory: Negative for dyspnea at rest, dyspnea on exertion, cough, sputum, wheezing.  GI: See history of present illness. GU:  Negative for dysuria, hematuria, urinary incontinence, urinary frequency, nocturnal urination.  Endo: Negative for unusual weight change.    Physical Examination:   BP (!) 142/76   Pulse 73   Ht 5\' 4"  (1.626 m)   Wt 225 lb (102.1 kg)   BMI 38.62 kg/m   General: mild distress, on oxygen  Eyes: No icterus. Conjunctivae pink. Mouth: Oropharyngeal mucosa moist and pink , no lesions erythema or exudate. Lungs: Clear to auscultation bilaterally. Non-labored. Heart: Regular rate and rhythm, no murmurs rubs or gallops.  Abdomen: Bowel sounds are normal, moderate epigastric tenderness , nondistended, no hepatosplenomegaly or masses, no abdominal bruits or hernia , no rebound or guarding.   Extremities: No  lower extremity edema. No clubbing or deformities. Neuro: Alert and oriented x 3.  Grossly intact. Skin: Warm and dry, no jaundice.   Psych: Alert and cooperative, normal mood and affect.   Imaging Studies: Ct Thoracic Spine Wo Contrast  Result Date: 06/04/2018 CLINICAL DATA:  Thoracic and low back pain since 04/15/2018. Difficulty walking and standing. Reported fracture on office radiographs. Prior back surgeries. EXAM: CT THORACIC SPINE WITHOUT CONTRAST TECHNIQUE: Multidetector CT images of the thoracic were obtained using the standard protocol without intravenous contrast. COMPARISON:  CT abdomen and pelvis 04/04/2018 FINDINGS: Alignment: 2 mm anterolisthesis of C7 on T1. Vertebrae: Unhealed T12 superior endplate compression fracture with 25% height loss and 3 mm retropulsion of the superior endplate, new from the prior abdominal CT. No suspicious osseous lesion. Solid C6-7 ACDF. Paraspinal and other soft tissues: Cardiomegaly with coronary artery atherosclerosis. 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 Thoracic aortic atherosclerosis without aneurysm. Disc levels: Anterior vertebral osteophyte formation and disc space narrowing from T6-7 to T9-10. No osseous spinal canal stenosis. Scattered mild-to-moderate facet arthrosis with up to mild osseous neural foraminal stenosis. IMPRESSION: 1. Unhealed T12 compression fracture with 25% height loss. 2. 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 3.  Aortic Atherosclerosis (ICD10-I70.0). Electronically Signed   By: Logan Bores M.D.   On: 06/04/2018 14:45   Ct Lumbar Spine Wo Contrast  Result Date: 06/04/2018 CLINICAL DATA:  Thoracic and low back pain since 04/15/2018. Difficulty walking and standing. Reported fracture on office radiographs. Prior back surgeries. EXAM: CT LUMBAR SPINE WITHOUT CONTRAST TECHNIQUE: Multidetector CT imaging of the lumbar spine was performed without intravenous contrast administration. Multiplanar CT  image reconstructions were also generated. COMPARISON:  CT abdomen and pelvis 04/04/2018. Lumbar spine MRI 09/30/2014. FINDINGS: Segmentation: 5 lumbar type vertebrae. Alignment: Slight lumbar levoscoliosis. No listhesis. Vertebrae: New L1 compression fracture involving the superior and inferior endplates with 13% height loss centrally and no retropulsion. Mild patchy sclerosis in the L1 vertebral body may reflect a subacute time course, however the fracture line remains clearly visible without a discrete osseous lesion identified at this level or elsewhere in the lumbar spine to strongly suggest a pathologic fracture. L4-S1 posterior fusion without evidence of pedicle screw loosening and likely with posterior element osseous fusion at both levels. Severe disc space narrowing at L4-5 greater than L5-S1 with degenerative interbody ankylosis at L4-5 and  possibly L5-S1. Paraspinal and other soft tissues: Abdominal aortic atherosclerosis without aneurysm. Cholecystectomy clips. Disc levels: L1-2: Mild disc bulging and mild-to-moderate facet and ligamentum flavum hypertrophy, slightly progressed from the prior MRI but resulting in at most mild spinal stenosis. L2-3: Disc bulging and moderate facet and ligamentum flavum hypertrophy result in mild-to-moderate spinal stenosis and mild bilateral neural foraminal stenosis, mildly progressed from the prior MRI. L3-4: Disc bulging and moderate facet and ligamentum flavum hypertrophy result in mild-to-moderate spinal stenosis without significant neural foraminal stenosis, unchanged. L4-5: Prior posterior decompression and fusion. No stenosis. L5-S1: Prior posterior decompression and fusion. Unchanged mild left neural foraminal stenosis due to spurring. IMPRESSION: 1. Unhealed L1 compression fracture with 50% height 2. 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 3. L4-S1 fusion. 4.  Aortic Atherosclerosis (ICD10-I70.0). Electronically Signed   By: Logan Bores M.D.   On: 06/04/2018 15:03    Assessment and Plan:   ZHURI KRASS is a 77 y.o. y/o female here to see me for an urgent visit for abdominal pain and diarrhea. S he was recently admitted with SBO and upper GI bleed secondary to a large gastric polyp which was treated with epinephrine and hemospray. Was on Eloquis. Since discharge Hb has been stable and is doing ok. Explained the gastric polyp may bleed again and would need to be resected.   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 Check CBC to ensure stable. Chck CMP,lipase  2. Urgent CT abdomen and pelvis to evaluate abdominal pain  3. If gets worse go to ER 4. Continue PPI, add crafate.   :  Addendum : While waiting to get checked out, developed severe intense worsening of her abdominal pain. Called EMS and transferred to ER.    Dr Jonathon Bellows  MD,MRCP [facility]) Follow up in 2-3 weeks

## 2018-06-14 NOTE — ED Notes (Signed)
Pt states took Norco at approx 11 PTA, and was given 109mcg Fentanyl en route.

## 2018-06-14 NOTE — ED Notes (Signed)
Patient is complaining of right upper quadrant pain and epigastric pain x 1 week, worse today.  Patient tearful.  Patient is also complaining of back pain that she has had for over 1 month.

## 2018-06-14 NOTE — H&P (Signed)
[facility] [place] NAME: Deborah Obrien    MR#:  [id number]  DATE OF BIRTH:  [birth year]  DATE OF ADMISSION:  06/14/2018  PRIMARY CARE PHYSICIAN: [name], MD   REQUESTING/REFERRING PHYSICIAN: Dr. Jimmye Norman  CHIEF COMPLAINT:   Chief Complaint  Patient presents with  . Abdominal Pain    HISTORY OF PRESENT ILLNESS:  Deborah Obrien  is a 77 y.o. female with a known history of diabetes, hypertension, recent upper GI bleed with gastric polyp, anxiety, recent kyphoplasty presents to the emergency room with 1 week of worsening epigastric and right upper quadrant pain.  The patient went to her GI physician Dr. Georgeann Oppenheim office who referred her to the emergency room.  A CT scan of the abdomen and pelvis has been checked and is normal.  Urinalysis shows UTI.  Mild acute kidney injury and patient is being admitted to the hospital. Patient was started on Macrobid yesterday by her primary care physician due to diagnosis of UTI.  PAST MEDICAL HISTORY:   Past Medical History:  Diagnosis Date  . A-fib ([place]) 2016  . Acid reflux   . Anxiety   . Bowel obstruction ([place])   . CHF (congestive heart failure) ([place]) 2017  . Chronic kidney disease (CKD) stage G3a/A1, moderately decreased glomerular filtration rate (GFR) between 45-59 mL/min/1.73 square meter and albuminuria creatinine ratio less than 30 mg/g (HCC)   . Diabetes mellitus without complication ([place])   . Dyspnea   . Fatty liver   . GI bleed   . Gout   . Hypertension   . Morbid obesity ([place])   . Osteoarthritis   . Osteoporosis   . Paroxysmal atrial fibrillation (HCC)   . Presence of permanent cardiac pacemaker   . Sinus node dysfunction ([place]) 08/24/2017  . TIA (transient ischemic attack)     PAST SURGICAL HISTORY:   Past Surgical History:  Procedure Laterality Date  . ABDOMINAL HYSTERECTOMY  1980  . APPENDECTOMY    . BACK SURGERY     2008  . BREAST BIOPSY Right 08/16    stereo fibroadenomatous change, neg for atypia  . BREAST EXCISIONAL BIOPSY Left    neg  . CARDIAC CATHETERIZATION Left 08/25/2016   Procedure: Left Heart Cath and Coronary Angiography;  Surgeon: Corey Skains, MD;  Location: [facility];  Service: Cardiovascular;  Laterality: Left;  . CARDIOVERSION N/A 06/28/2017   Procedure: CARDIOVERSION;  Surgeon: Corey Skains, MD;  Location: [facility];  Service: Cardiovascular;  Laterality: N/A;  . CARDIOVERSION N/A 02/06/2018   Procedure: CARDIOVERSION;  Surgeon: Josue Hector, MD;  Location: [facility];  Service: Cardiovascular;  Laterality: N/A;  . CATARACT EXTRACTION, BILATERAL  2012  . CHOLECYSTECTOMY    . colon blockage  1999  . COLON SURGERY  1999  . COLONOSCOPY  2016   polyps removed 2016  . DORSAL COMPARTMENT RELEASE Left 09/14/2016   Procedure: RELEASE DORSAL COMPARTMENT (DE[place]ERVAIN);  Surgeon: Dereck Leep, MD;  Location: [facility];  Service: Orthopedics;  Laterality: Left;  . ESOPHAGOGASTRODUODENOSCOPY N/A 01/27/2015   Procedure: ESOPHAGOGASTRODUODENOSCOPY (EGD);  Surgeon: Lollie Sails, MD;  Location: [facility];  Service: Endoscopy;  Laterality: N/A;  . ESOPHAGOGASTRODUODENOSCOPY (EGD) WITH PROPOFOL N/A 07/24/2015   Procedure: ESOPHAGOGASTRODUODENOSCOPY (EGD) WITH PROPOFOL;  Surgeon: Lucilla Lame, MD;  Location: [facility];  Service: Endoscopy;  Laterality: N/A;  . ESOPHAGOGASTRODUODENOSCOPY (EGD) WITH PROPOFOL N/A 04/12/2018   Procedure: ESOPHAGOGASTRODUODENOSCOPY (EGD) WITH PROPOFOL;  Surgeon: Jonathon Bellows,  MD;  Location: [facility];  Service: Gastroenterology;  Laterality: N/A;  . JOINT REPLACEMENT  2014   Bilateral Knee replacement  . LEAD REVISION/REPAIR N/A 08/29/2017   Procedure: LEAD REVISION/REPAIR;  Surgeon: Constance Haw, MD;  Location: [facility];  Service: Cardiovascular;  Laterality: N/A;  . PACEMAKER INSERTION Left 07/01/2015   Procedure: INSERTION PACEMAKER;  Surgeon: Isaias Cowman, MD;  Location: [facility];  Service: Cardiovascular;  Laterality: Left;  . PACEMAKER REVISION N/A 08/28/2017   Procedure: PACEMAKER REVISION;  Surgeon: Deboraha Sprang, MD;  Location: [facility];  Service: Cardiovascular;  Laterality: N/A;  . REPLACEMENT TOTAL KNEE BILATERAL    . SHOULDER ARTHROSCOPY WITH SUBACROMIAL DECOMPRESSION Left 2013  . TEE WITHOUT CARDIOVERSION N/A 06/28/2017   Procedure: TRANSESOPHAGEAL ECHOCARDIOGRAM (TEE);  Surgeon: Corey Skains, MD;  Location: [facility];  Service: Cardiovascular;  Laterality: N/A;    SOCIAL HISTORY:   Social History   Tobacco Use  . Smoking status: Never Smoker  . Smokeless tobacco: Never Used  Substance Use Topics  . Alcohol use: No    FAMILY HISTORY:   Family History  Problem Relation Age of Onset  . Diabetes Mellitus II Mother   . CAD Mother   . Heart attack Mother   . Cancer Father        skin  . Breast cancer Neg Hx     DRUG ALLERGIES:   Allergies  Allergen Reactions  . Cephalosporins Other (See Comments)    Reaction:  Unknown   . Fish Allergy Shortness Of Breath  . Macrolides And Ketolides Other (See Comments)    Reaction:  Unknown  Other Reaction: Intolerance Other Reaction: Intolerance   . Meperidine Shortness Of Breath, Nausea Only and Other (See Comments)    Reaction:  Stomach pain   . Other Nausea Only, Rash and Other (See Comments)    Uncoded Allergy. Allergen: NON-STEROIDS, Other Reaction: Not Assessed bextra - hands and feet swell Shellfish - SOB Shellfish - SOB Other Reaction: Not Assessed  . Prednisone Anaphylaxis and Other (See Comments)    Reaction:  Increases pts blood sugar  Pt states that she is allergic to all steroids.    . Shellfish Allergy Anaphylaxis, Shortness Of Breath, Diarrhea, Nausea Only and Rash    Reaction:  Stomach pain  Other reaction(s): Other (See Comments) Reaction:Stomach pain   . Sulfa Antibiotics Shortness Of Breath, Diarrhea, Nausea Only and Other  (See Comments)    Reaction:  Stomach pain  Reaction:Stomach pain  Other Reaction: Intolerance   . Sulfacetamide Sodium Diarrhea, Nausea Only, Other (See Comments) and Shortness Of Breath    Reaction:  Stomach pain   . Telbivudine     Other reaction(s): Other (See Comments) Reaction:Unknown   . Uloric [Febuxostat] Anaphylaxis    Locks pt's body up   . Aspirin Other (See Comments)    Reaction:  Stomach pain   . Celecoxib Other (See Comments)    Reaction:  GI bleed, weakness, and stomach pain.    . Cephalexin Diarrhea, Nausea Only and Other (See Comments)    Reaction:  Stomach pain   . Erythromycin Diarrhea, Nausea Only and Other (See Comments)    Reaction:  Stomach pain   . Hydromorphone Other (See Comments)    Reaction:  Hypotension   . Iodinated Diagnostic Agents Rash and Other (See Comments)    Pt states that she is unable to have because she has chronic kidney disease.   Pt states that  she is unable to have because she has chronic kidney disease. CKD CKD  . Oxycodone Other (See Comments)    Reaction:  Stomach pain   . Atorvastatin Nausea Only and Other (See Comments)    Reaction:  Weakness   . Codeine Diarrhea, Nausea Only and Other (See Comments)    Reaction:  Stomach pain Pt tolerates morphine   . Doxycycline     Stomach pain   . Tape Other (See Comments)    Reaction:  Causes pts skin to tear  Pt states that she is able to use paper tape.      . Valdecoxib Swelling and Other (See Comments)    Pt states that her hands and feet swell.    . Iodine Rash    REVIEW OF SYSTEMS:   Review of Systems  Constitutional: Positive for malaise/fatigue. Negative for chills and fever.  HENT: Negative for sore throat.   Eyes: Negative for blurred vision, double vision and pain.  Respiratory: Negative for cough, hemoptysis, shortness of breath and wheezing.   Cardiovascular: Negative for chest pain, palpitations, orthopnea and leg swelling.  Gastrointestinal: Positive for  abdominal pain, constipation and nausea. Negative for diarrhea, heartburn and vomiting.  Genitourinary: Negative for dysuria and hematuria.  Musculoskeletal: Negative for back pain and joint pain.  Skin: Negative for rash.  Neurological: Negative for sensory change, speech change, focal weakness and headaches.  Endo/Heme/Allergies: Does not bruise/bleed easily.  Psychiatric/Behavioral: Negative for depression. The patient is not nervous/anxious.     MEDICATIONS AT HOME:   Prior to Admission medications   Medication Sig Start Date End Date Taking? Authorizing Provider  acetaminophen (TYLENOL) 325 MG tablet Take 2 tablets (650 mg total) by mouth every 6 (six) hours as needed for mild pain (or Fever >/= 101). 04/23/18   Nicholes Mango, MD  allopurinol (ZYLOPRIM) 100 MG tablet Take 150 mg by mouth daily.     [provider]  amiodarone (PACERONE) 200 MG tablet Take 1 tablet (200 mg total) by mouth 2 (two) times daily. 05/10/18   Deboraha Sprang, MD  apixaban (ELIQUIS) 5 MG TABS tablet Take 1 tablet (5 mg total) by mouth 2 (two) times daily. 03/05/18   Deboraha Sprang, MD  benzocaine-Menthol (DERMOPLAST) 20-0.5 % AERO Apply 1 application topically 4 (four) times daily as needed for irritation.    [provider]  bisacodyl (DULCOLAX) 10 MG suppository Place 1 suppository (10 mg total) rectally daily as needed for moderate constipation. 04/23/18   Gouru, Illene Silver, MD  busPIRone (BUSPAR) 10 MG tablet Take 10 mg by mouth 2 (two) times daily.     [provider]  cholecalciferol (VITAMIN D) 1000 units tablet Take 1,000 Units by mouth daily.    [provider]  colchicine 0.6 MG tablet Take 0.6 mg by mouth daily as needed (gout).     [provider]  dibucaine (NUPERCAINAL) 1 % OINT Place 1 application rectally as needed for hemorrhoids. 04/23/18   Nicholes Mango, MD  diphenhydrAMINE (BENADRYL) 25 mg capsule Take 1 capsule (25 mg total) by mouth at bedtime. 04/23/18    Nicholes Mango, MD  docusate sodium (COLACE) 100 MG capsule Take 100 mg by mouth at bedtime.     [provider]  ferrous sulfate 325 (65 FE) MG EC tablet Take 1 tablet (325 mg total) by mouth 2 (two) times daily. 04/23/18 05/23/18  Nicholes Mango, MD  furosemide (LASIX) 40 MG tablet Take 1 tablet (40 mg total) by mouth 2 (  two) times daily. 04/23/18   Nicholes Mango, MD  HYDROcodone-acetaminophen (NORCO/VICODIN) 5-325 MG tablet Take 1 tablet by mouth every 6 (six) hours as needed for moderate pain. 04/25/18 04/25/19  Gerlene Fee, NP  hydrocortisone 2.5 % cream Apply 1 application topically daily as needed (rash).    [provider]  insulin NPH-regular Human (NOVOLIN 70/30) (70-30) 100 UNIT/ML injection Inject 22 Units into the skin 2 (two) times daily.     [provider]  ipratropium-albuterol (DUONEB) 0.5-2.5 (3) MG/3ML SOLN Take 3 mLs by nebulization 2 (two) times daily. 04/23/18   Gouru, Illene Silver, MD  lidocaine (LIDODERM) 5 % Place 1 patch onto the skin daily. Remove & Discard patch within 12 hours or as directed by MD 04/23/18   Nicholes Mango, MD  Multiple Vitamin (MULTIVITAMIN WITH MINERALS) TABS tablet Take 1 tablet by mouth daily. 04/24/18   Nicholes Mango, MD  neomycin-bacitracin-polymyxin (NEOSPORIN) ointment Apply topically as needed for wound care. 04/23/18   Gouru, Illene Silver, MD  nitrofurantoin, macrocrystal-monohydrate, (MACROBID) 100 MG capsule Take by mouth. 06/13/18 06/20/18  [provider]  NON FORMULARY Diet Type: Mechanical Soft Chopped    [provider]  oxybutynin (DITROPAN-XL) 5 MG 24 hr tablet Take 2.5 mg by mouth at bedtime.    [provider]  OXYGEN Inhale 2 L into the lungs continuous.    [provider]  pantoprazole (PROTONIX) 40 MG tablet Take 1 tablet (40 mg total) by mouth 2 (two) times daily. 12/29/17   Darel Hong, MD  PARoxetine (PAXIL) 40 MG tablet Take 40 mg by mouth every morning.    [provider]   Polyethyl Glycol-Propyl Glycol (SYSTANE) 0.4-0.3 % SOLN Place 1 drop into both eyes 2 (two) times daily as needed.    [provider]  polyethylene glycol (MIRALAX / GLYCOLAX) packet Take 17 g by mouth daily. 04/24/18   Nicholes Mango, MD  pravastatin (PRAVACHOL) 20 MG tablet Take 20 mg by mouth at bedtime.     [provider]  senna-docusate (SENOKOT-S) 8.6-50 MG tablet Take 1 tablet by mouth every morning. 04/24/18   Gouru, Illene Silver, MD  sucralfate (CARAFATE) 1 GM/10ML suspension Take 10 mLs (1 g total) by mouth 4 (four) times daily. 06/14/18   Jonathon Bellows, MD  talc (ZEASORB) powder Apply 1 application topically as needed (under the breast irritation).    [provider]  traZODone (DESYREL) 50 MG tablet TAKE 1 TO 2 TABLETS BY MOUTH AT BEDTIME AS NEEDED 06/07/18   [provider]     VITAL SIGNS:  Blood pressure (!) 121/48, pulse 66, temperature 98.3 F (36.8 C), temperature source Oral, resp. rate 12, height 5\' 4"  (1.626 m), weight 102.1 kg, SpO2 100 %.  PHYSICAL EXAMINATION:  Physical Exam  GENERAL:  77 y.o.-year-old patient lying in the bed.  In distress due to pain.  Anxious EYES: Pupils equal, round, reactive to light and accommodation. No scleral icterus. Extraocular muscles intact.  HEENT: Head atraumatic, normocephalic. Oropharynx and nasopharynx clear. No oropharyngeal erythema, moist oral mucosa  NECK:  Supple, no jugular venous distention. No thyroid enlargement, no tenderness.  LUNGS: Normal breath sounds bilaterally, no wheezing, rales, rhonchi. No use of accessory muscles of respiration.  CARDIOVASCULAR: S1, S2 normal. No murmurs, rubs, or gallops.  ABDOMEN: Soft, epigastric tenderness, nondistended. Bowel sounds present. No organomegaly or mass.  EXTREMITIES: No pedal edema, cyanosis, or clubbing. + 2 pedal & radial pulses b/l.   NEUROLOGIC: Cranial nerves II through XII are intact. No  focal Motor or sensory deficits appreciated b/l PSYCHIATRIC:  The patient is alert and oriented x 3. Good affect.  SKIN: No obvious rash, lesion, or ulcer.   LABORATORY PANEL:   CBC Recent Labs  Lab 06/14/18 1227  WBC 6.1  HGB 12.3  HCT 35.1  PLT 247   ------------------------------------------------------------------------------------------------------------------  Chemistries  Recent Labs  Lab 06/14/18 1227  NA 131*  K 4.5  CL 92*  CO2 25  GLUCOSE 249*  BUN 20  CREATININE 1.91*  CALCIUM 9.3  AST 47*  ALT 14  ALKPHOS 128*  BILITOT 1.3*   ------------------------------------------------------------------------------------------------------------------  Cardiac Enzymes No results for input(s): TROPONINI in the last 168 hours. ------------------------------------------------------------------------------------------------------------------  RADIOLOGY:  Ct Abdomen Pelvis Wo Contrast  Result Date: 06/14/2018 CLINICAL DATA:  Abdominal pain with nausea and EXAM: CT ABDOMEN AND PELVIS WITHOUT CONTRAST TECHNIQUE: Multidetector CT imaging of the abdomen and pelvis was performed following the standard protocol without oral or IV contrast. COMPARISON:  April 04, 2018 and Jan 25, 2016 FINDINGS: Lower chest: There is atelectatic change in the left base. No lung base edema or consolidation. Pacemaker leads are attached to the right atrium and right ventricle. Hepatobiliary: No focal liver lesions are appreciable on this noncontrast enhanced study except for a small calcification in the inferior posterior right lobe of the liver, a probable tiny granuloma. Gallbladder is absent. There is no appreciable biliary duct dilatation. Pancreas: Pancreas is atrophic. No pancreatic mass or inflammatory focus evident. Spleen: No splenic lesions are evident. A small accessory spleen is seen medial to the spleen. Adrenals/Urinary Tract: Adrenals appear normal bilaterally. Kidneys bilaterally show no evident renal mass or hydronephrosis on either side. There is an  extrarenal pelvis on the right, an anatomic variant. There is no evident renal or ureteral calculus on either side. Urinary bladder is midline with wall thickness within normal limits. Stomach/Bowel: There is moderate stool in the colon. There is no bowel wall or mesenteric thickening. There is no evident bowel obstruction. No free air or portal venous air. Surgical clips are again noted in the pelvic region, stable. Vascular/Lymphatic: There is aortoiliac atherosclerosis. No aneurysm evident. Major mesenteric arterial vessels appear patent on this noncontrast enhanced study. No adenopathy is evident in the abdomen or pelvis. Reproductive: Uterus is absent.  No evident pelvic mass. Other: Appendix absent. No periappendiceal region inflammatory change evident. There is no abscess or ascites evident in the abdomen or pelvis. Musculoskeletal: Patient has had previous kyphoplasty procedures at T12 and L1. There is postoperative screw and plate fixation at L4, L5, and S1. there are no blastic or lytic bone lesions. There is no intramuscular or abdominal wall lesion. IMPRESSION: 1. No evident bowel obstruction. No abscess in the abdomen or pelvis. Postoperative clips noted in the pelvis. Appendix absent. 2. No renal or ureteral calculus on either side. No hydronephrosis. 3.  Atrophic pancreas.  No focal pancreatic lesions. 4.  Gallbladder absent.  Uterus absent. 5. Multilevel postoperative change in the lumbar spine. Status post kyphoplasty procedures at T12 and L1. 6.  Aortoiliac atherosclerosis. Aortic Atherosclerosis (ICD10-I70.0). Electronically Signed   By: Lowella Grip III M.D.   On: 06/14/2018 13:39     IMPRESSION AND PLAN:   *UTI.  Start IV ciprofloxacin.  Patient has multiple allergies which limits antibiotic choices.  Wait for cultures.  Hold Macrobid.  *Acute kidney injury over CKD stage III.  Start IV fluids.  Monitor input and output.  Repeat labs in the morning.  *Epigastric abdominal pain.  CT scan of the abdomen and pelvis is normal.  Unclear if UTI is causing her symptoms.  Could be gastritis with her location and history.  Continue twice daily PPI.  *Diabetes mellitus.  Continue home insulin at lower dose.  Sliding scale insulin added  *Paroxysmal atrial fibrillation.  Continue medications and Eliquis.  All the records are reviewed and case discussed with ED provider. Management plans discussed with the patient, family and they are in agreement.  CODE STATUS: Full code  TOTAL TIME TAKING CARE OF THIS PATIENT: 35 minutes.   Leia Alf Marcelle Bebout M.D on 06/14/2018 at 3:36 PM  Between 7am to 6pm - Pager - [phone]  After 6pm go to [email or web address] - password [email or web address] [facility]  Office  [phone]  CC: Primary care physician; [name], MD  Note: This dictation was prepared with Dragon dictation along with smaller phrase technology. Any transcriptional errors that result from this process are unintentional.

## 2018-06-14 NOTE — ED Notes (Signed)
EMS pt to lobby , from MD office , lower abd pain x1 day , o2 [place] 2 liters

## 2018-06-14 NOTE — ED Triage Notes (Signed)
Pt presents to ED vai [facility] with c/o 10/10 abdominal pain. Pt states was admitted previously for blood in vomit and stool. Pt denies any today, states just having pain to upper abdomen with nausea and vomiting phlegm. Per EMS pt was given 42mcg en route.

## 2018-06-14 NOTE — Addendum Note (Signed)
Addended by: Jonathon Bellows on: 06/14/2018 01:36 PM   Modules accepted: Level of Service

## 2018-06-14 NOTE — Progress Notes (Signed)
Advance care planning  Purpose of Encounter UTI, AKI. Code status discussion  Parties in Attendance Patient and husband at bedside  Patients Decisional capacity Patient is alert and oriented.  Able to make medical decisions.  Has documented healthcare power of attorney and advanced directives.  Discussed regarding patient's need for admission with UTI and acute kidney injury.  All questions answered.   Full code. Orders entered  Time spent - 17 minutes

## 2018-06-15 ENCOUNTER — Telehealth: Payer: Self-pay | Admitting: Gastroenterology

## 2018-06-15 LAB — GLUCOSE, CAPILLARY
GLUCOSE-CAPILLARY: 226 mg/dL — AB (ref 70–99)
Glucose-Capillary: 91 mg/dL (ref 70–99)
Glucose-Capillary: 166 mg/dL — ABNORMAL HIGH (ref 70–99)
Glucose-Capillary: 204 mg/dL — ABNORMAL HIGH (ref 70–99)
Glucose-Capillary: 250 mg/dL — ABNORMAL HIGH (ref 70–99)

## 2018-06-15 LAB — BASIC METABOLIC PANEL
Anion gap: 12 (ref 5–15)
BUN: 21 mg/dL (ref 8–23)
CHLORIDE: 99 mmol/L (ref 98–111)
CO2: 23 mmol/L (ref 22–32)
Calcium: 8 mg/dL — ABNORMAL LOW (ref 8.9–10.3)
Creatinine, Ser: 1.86 mg/dL — ABNORMAL HIGH (ref 0.44–1.00)
GFR calc Af Amer: 29 mL/min — ABNORMAL LOW (ref >60)
GFR calc non Af Amer: 25 mL/min — ABNORMAL LOW (ref >60)
Glucose, Bld: 166 mg/dL — ABNORMAL HIGH (ref 70–99)
POTASSIUM: 4.4 mmol/L (ref 3.5–5.1)
Sodium: 134 mmol/L — ABNORMAL LOW (ref 135–145)

## 2018-06-15 LAB — CBC
HEMATOCRIT: 30.3 % — AB (ref 35.0–47.0)
Hemoglobin: 10.5 g/dL — ABNORMAL LOW (ref 12.0–16.0)
MCH: 32.4 pg (ref 26.0–34.0)
MCHC: 34.5 g/dL (ref 32.0–36.0)
MCV: 93.9 fL (ref 80.0–100.0)
Platelets: 203 10*3/uL (ref 150–440)
RBC: 3.23 MIL/uL — AB (ref 3.80–5.20)
RDW: 14.7 % — ABNORMAL HIGH (ref 11.5–14.5)
WBC: 5.2 10*3/uL (ref 3.6–11.0)

## 2018-06-15 LAB — HEMOGLOBIN A1C
Hgb A1c MFr Bld: 6.7 % — ABNORMAL HIGH (ref 4.8–5.6)
Mean Plasma Glucose: 146 mg/dL

## 2018-06-15 MED ORDER — PHENOL 1.4 % MT LIQD
1.0000 | OROMUCOSAL | Status: DC | PRN
  Administered 2018-06-16: 1 via OROMUCOSAL
  Filled 2018-06-15: qty 177

## 2018-06-15 MED ORDER — DEXTROSE 50 % IV SOLN
25.0000 mL | Freq: Once | INTRAVENOUS | Status: AC
  Administered 2018-06-15: 25 mL via INTRAVENOUS

## 2018-06-15 MED ORDER — CIPROFLOXACIN HCL 500 MG PO TABS
250.0000 mg | ORAL_TABLET | Freq: Two times a day (BID) | ORAL | Status: DC
  Administered 2018-06-15 – 2018-06-18 (×6): 250 mg via ORAL
  Filled 2018-06-15 (×6): qty 1

## 2018-06-15 MED ORDER — CIPROFLOXACIN IN D5W 400 MG/200ML IV SOLN
400.0000 mg | INTRAVENOUS | Status: DC
  Filled 2018-06-15: qty 200

## 2018-06-15 NOTE — Telephone Encounter (Signed)
Spoke with pt's husband and informed him of Dr. Georgeann Oppenheim recommendation. Pt stated she will definitely be at the hospital tomorrow as she is still in a lot of pain.

## 2018-06-15 NOTE — Progress Notes (Signed)
Called Dr. Jannifer [name] regarding patient's blood sugar of 54.  When first taken, it was 44, gave orange juice with sugar mixed in and it increased to 54 after 30 minutes.  Gave patient ginger ale and 1/2 amp of D50 and doctor gave orders to recheck in half hour.  The patient's blood sugar increased to 136 after D50 was given.  Will recheck at 0300 and proceed from there.  Will continue to monitor patient.  Phoebe Sharps N 06/15/2018  1:09 AM

## 2018-06-15 NOTE — Telephone Encounter (Signed)
Inform am not in the hospital today. The hospitalist can call the gi doctor if needed. If she is still in the hospital tomorrow I can see her

## 2018-06-15 NOTE — Progress Notes (Addendum)
[facility] [place] NAME: Deborah Obrien    MR#:  [id number]  DATE OF BIRTH:  November 25, 1940  SUBJECTIVE:  patient came in with epigastric abdominal pain. She recently had vertebral fracture status post-kyphoplasty. Complaining of back pain as well.   REVIEW OF SYSTEMS:   Review of Systems  Constitutional: Negative for chills, fever and weight loss.  HENT: Negative for ear discharge, ear pain and nosebleeds.   Eyes: Negative for blurred vision, pain and discharge.  Respiratory: Negative for sputum production, shortness of breath, wheezing and stridor.   Cardiovascular: Negative for chest pain, palpitations, orthopnea and PND.  Gastrointestinal: Positive for abdominal pain. Negative for diarrhea, nausea and vomiting.  Genitourinary: Negative for frequency and urgency.  Musculoskeletal: Positive for back pain. Negative for joint pain.  Neurological: Negative for sensory change, speech change, focal weakness and weakness.  Psychiatric/Behavioral: Negative for depression and hallucinations. The patient is not nervous/anxious.    Tolerating Diet:yes Tolerating PT: pending  DRUG ALLERGIES:   Allergies  Allergen Reactions  . Cephalosporins Other (See Comments)    Reaction:  Unknown   . Fish Allergy Shortness Of Breath  . Macrolides And Ketolides Other (See Comments)    Reaction:  Unknown  Other Reaction: Intolerance Other Reaction: Intolerance   . Meperidine Shortness Of Breath, Nausea Only and Other (See Comments)    Reaction:  Stomach pain   . Other Nausea Only, Rash and Other (See Comments)    Uncoded Allergy. Allergen: NON-STEROIDS, Other Reaction: Not Assessed bextra - hands and feet swell Shellfish - SOB Shellfish - SOB Other Reaction: Not Assessed  . Prednisone Anaphylaxis and Other (See Comments)    Reaction:  Increases pts blood sugar  Pt states that she is allergic to all steroids.    . Shellfish Allergy  Anaphylaxis, Shortness Of Breath, Diarrhea, Nausea Only and Rash    Reaction:  Stomach pain  Other reaction(s): Other (See Comments) Reaction:Stomach pain   . Sulfa Antibiotics Shortness Of Breath, Diarrhea, Nausea Only and Other (See Comments)    Reaction:  Stomach pain  Reaction:Stomach pain  Other Reaction: Intolerance   . Sulfacetamide Sodium Diarrhea, Nausea Only, Other (See Comments) and Shortness Of Breath    Reaction:  Stomach pain   . Telbivudine     Other reaction(s): Other (See Comments) Reaction:Unknown   . Uloric [Febuxostat] Anaphylaxis    Locks pt's body up   . Aspirin Other (See Comments)    Reaction:  Stomach pain   . Celecoxib Other (See Comments)    Reaction:  GI bleed, weakness, and stomach pain.    . Cephalexin Diarrhea, Nausea Only and Other (See Comments)    Reaction:  Stomach pain   . Erythromycin Diarrhea, Nausea Only and Other (See Comments)    Reaction:  Stomach pain   . Hydromorphone Other (See Comments)    Reaction:  Hypotension   . Iodinated Diagnostic Agents Rash and Other (See Comments)    Pt states that she is unable to have because she has chronic kidney disease.   Pt states that she is unable to have because she has chronic kidney disease. CKD CKD  . Oxycodone Other (See Comments)    Reaction:  Stomach pain   . Atorvastatin Nausea Only and Other (See Comments)    Reaction:  Weakness   . Codeine Diarrhea, Nausea Only and Other (See Comments)    Reaction:  Stomach pain Pt tolerates morphine   . Doxycycline  Stomach pain   . Tape Other (See Comments)    Reaction:  Causes pts skin to tear  Pt states that she is able to use paper tape.      . Valdecoxib Swelling and Other (See Comments)    Pt states that her hands and feet swell.    . Iodine Rash  . Morphine And Related Rash    VITALS:  Blood pressure (!) 115/43, pulse 60, temperature 97.9 F (36.6 C), temperature source Oral, resp. rate 16, height 5\' 4"  (1.626 m), weight  107.3 kg, SpO2 100 %.  PHYSICAL EXAMINATION:   Physical Exam  GENERAL:  77 y.o.-year-old patient lying in the bed with no acute distress.  EYES: Pupils equal, round, reactive to light and accommodation. No scleral icterus. Extraocular muscles intact.  HEENT: Head atraumatic, normocephalic. Oropharynx and nasopharynx clear.  NECK:  Supple, no jugular venous distention. No thyroid enlargement, no tenderness.  LUNGS: Normal breath sounds bilaterally, no wheezing, rales, rhonchi. No use of accessory muscles of respiration. Palpable pain right mid back to right upper quadrant. No epigastric tenderness guarding or rigidity CARDIOVASCULAR: S1, S2 normal. No murmurs, rubs, or gallops.  ABDOMEN: Soft, nontender, nondistended. Bowel sounds present. No organomegaly or mass.  EXTREMITIES: No cyanosis, clubbing or edema b/l.    NEUROLOGIC: Cranial nerves II through XII are intact. No focal Motor or sensory deficits b/l.   PSYCHIATRIC:  patient is alert and oriented x 3.  SKIN: No obvious rash, lesion, or ulcer.   LABORATORY PANEL:  CBC Recent Labs  Lab 06/15/18 0846  WBC 5.2  HGB 10.5*  HCT 30.3*  PLT 203    Chemistries  Recent Labs  Lab 06/14/18 1227 06/15/18 0802  NA 131* 134*  K 4.5 4.4  CL 92* 99  CO2 25 23  GLUCOSE 249* 166*  BUN 20 21  CREATININE 1.91* 1.86*  CALCIUM 9.3 8.0*  AST 47*  --   ALT 14  --   ALKPHOS 128*  --   BILITOT 1.3*  --    Cardiac Enzymes No results for input(s): TROPONINI in the last 168 hours. RADIOLOGY:  Ct Abdomen Pelvis Wo Contrast  Result Date: 06/14/2018 CLINICAL DATA:  Abdominal pain with nausea and EXAM: CT ABDOMEN AND PELVIS WITHOUT CONTRAST TECHNIQUE: Multidetector CT imaging of the abdomen and pelvis was performed following the standard protocol without oral or IV contrast. COMPARISON:  April 04, 2018 and Jan 25, 2016 FINDINGS: Lower chest: There is atelectatic change in the left base. No lung base edema or consolidation. Pacemaker leads are  attached to the right atrium and right ventricle. Hepatobiliary: No focal liver lesions are appreciable on this noncontrast enhanced study except for a small calcification in the inferior posterior right lobe of the liver, a probable tiny granuloma. Gallbladder is absent. There is no appreciable biliary duct dilatation. Pancreas: Pancreas is atrophic. No pancreatic mass or inflammatory focus evident. Spleen: No splenic lesions are evident. A small accessory spleen is seen medial to the spleen. Adrenals/Urinary Tract: Adrenals appear normal bilaterally. Kidneys bilaterally show no evident renal mass or hydronephrosis on either side. There is an extrarenal pelvis on the right, an anatomic variant. There is no evident renal or ureteral calculus on either side. Urinary bladder is midline with wall thickness within normal limits. Stomach/Bowel: There is moderate stool in the colon. There is no bowel wall or mesenteric thickening. There is no evident bowel obstruction. No free air or portal venous air. Surgical clips are again noted in the  pelvic region, stable. Vascular/Lymphatic: There is aortoiliac atherosclerosis. No aneurysm evident. Major mesenteric arterial vessels appear patent on this noncontrast enhanced study. No adenopathy is evident in the abdomen or pelvis. Reproductive: Uterus is absent.  No evident pelvic mass. Other: Appendix absent. No periappendiceal region inflammatory change evident. There is no abscess or ascites evident in the abdomen or pelvis. Musculoskeletal: Patient has had previous kyphoplasty procedures at T12 and L1. There is postoperative screw and plate fixation at L4, L5, and S1. there are no blastic or lytic bone lesions. There is no intramuscular or abdominal wall lesion. IMPRESSION: 1. No evident bowel obstruction. No abscess in the abdomen or pelvis. Postoperative clips noted in the pelvis. Appendix absent. 2. No renal or ureteral calculus on either side. No hydronephrosis. 3.   Atrophic pancreas.  No focal pancreatic lesions. 4.  Gallbladder absent.  Uterus absent. 5. Multilevel postoperative change in the lumbar spine. Status post kyphoplasty procedures at T12 and L1. 6.  Aortoiliac atherosclerosis. Aortic Atherosclerosis (ICD10-I70.0). Electronically Signed   By: Lowella Grip III M.D.   On: 06/14/2018 13:39   ASSESSMENT AND PLAN:  Deborah Obrien  is a 77 y.o. female with a known history of diabetes, hypertension, recent upper GI bleed with gastric polyp, anxiety, recent kyphoplasty presents to the emergency room with 1 week of worsening epigastric and right upper quadrant pain.  The patient went to her GI physician Dr. Georgeann Oppenheim office who referred her to the emergency room.  A CT scan of the abdomen and pelvis has been checked and is normal.  Urinalysis shows UTI.  *UTI.  Start IV ciprofloxacin.  Patient has multiple allergies which limits antibiotic choices.  Wait for cultures.  Hold Macrobid. Change to oral Cipro. Patient denies any dysuria   *Acute kidney injury over CKD stage III.  Start IV fluids.  Monitor input and output.  Repeat labs in the morning. -Admin improving. Baseline creatinine 1.5-- 1.7  *Epigastric abdominal pain.  CT scan of the abdomen and pelvis is normal.  Unclear if UTI is causing her symptoms.  Could be gastritis with her location and history.  Continue twice daily PPI. -Patient had endoscopy done on April 12, 2018 which showed gastric polyp that was actively bleeding hemostasis was achieved. Patient feels her gastric polyp is causing pain. She wants G.I. to see her. -Patient has palpable pain from her right back coming to the right upper quadrant appears more musculoskeletal strain from her recent fall and vertebral fracture however cannot rule out G.I. issue as well.  *Diabetes mellitus.  Continue home insulin at lower dose.  Sliding scale insulin added  *Paroxysmal atrial fibrillation.  Continue medications and Eliquis.  Case  discussed with Care Management/Social Worker. Management plans discussed with the patient, family and they are in agreement.  CODE STATUS: full  DVT Prophylaxis: lovenox  TOTAL TIME TAKING CARE OF THIS PATIENT: 30 minutes.  >50% time spent on counselling and coordination of care  POSSIBLE D/C IN 1-2 DAYS, DEPENDING ON CLINICAL CONDITION.  Note: This dictation was prepared with Dragon dictation along with smaller phrase technology. Any transcriptional errors that result from this process are unintentional.  Fritzi Mandes M.D on 06/15/2018 at 2:13 PM  Between 7am to 6pm - Pager - [phone]  After 6pm go to [email or web address] - password [email or web address] [facility]  Office  [phone]  CC: Primary care physician; [name], MDPatient ID: [place], female   DOB: 06/29/1941, 77 y.o.   MRN: [id number]

## 2018-06-15 NOTE — Progress Notes (Signed)
Inpatient Diabetes Program Recommendations  AACE/ADA: New Consensus Statement on Inpatient Glycemic Control (2015)  Target Ranges:  Prepandial:   less than 140 mg/dL      Peak postprandial:   less than 180 mg/dL (1-2 hours)      Critically ill patients:  140 - 180 mg/dL   Results for SANDA, DEJOY (MRN [id number]) as of 06/15/2018 09:01  Ref. Range 06/14/2018 16:38 06/14/2018 21:35 06/14/2018 22:08 06/14/2018 23:38  Glucose-Capillary Latest Ref Range: 70 - 99 mg/dL 141 (H)  2 units NOVOLOG +  17 units 70/30 Insulin given at 6pm 44 (LL) 54 (L) 136 (H)   Results for RUNELL, KOVICH (MRN [id number]) as of 06/15/2018 09:01  Ref. Range 06/15/2018 02:50 06/15/2018 07:56  Glucose-Capillary Latest Ref Range: 70 - 99 mg/dL 91 166 (H)    Admit with: UTI/ Acute Kidney Injury  History: DM, CHF, CKD  Home DM Meds: 70/30 Insulin- 22 units BID  Current Orders: Novolog Sensitive Correction Scale/ SSI (0-9 units) TID AC + HS       Note patient with severe Hypoglycemia last evening at 9:30pm after receiving 17 units 70/30 Insulin with Dinner.  70/30 Insulin discontinued.  CBG 166 mg/dl this AM.  Agree with current orders.     --Will follow patient during hospitalization--  Wyn Quaker RN, MSN, CDE Diabetes Coordinator Inpatient Glycemic Control Team Team Pager: [phone] (8a-5p)

## 2018-06-15 NOTE — Telephone Encounter (Signed)
Pt husband is calling to speak with Dr. Vicente Males pt is still in Hospital room 205 but they want to discharge her pt feels she is still in pain and doe not want to be discharged. Pt husband states  He thinks it is the polyps hurting her and might want to get them removed please contact pt for advise

## 2018-06-16 ENCOUNTER — Inpatient Hospital Stay: Payer: Medicare Other

## 2018-06-16 DIAGNOSIS — R1010 Upper abdominal pain, unspecified: Secondary | ICD-10-CM

## 2018-06-16 DIAGNOSIS — K317 Polyp of stomach and duodenum: Secondary | ICD-10-CM

## 2018-06-16 LAB — GLUCOSE, CAPILLARY
GLUCOSE-CAPILLARY: 232 mg/dL — AB (ref 70–99)
Glucose-Capillary: 249 mg/dL — ABNORMAL HIGH (ref 70–99)
Glucose-Capillary: 294 mg/dL — ABNORMAL HIGH (ref 70–99)
Glucose-Capillary: 204 mg/dL — ABNORMAL HIGH (ref 70–99)
Glucose-Capillary: 223 mg/dL — ABNORMAL HIGH (ref 70–99)

## 2018-06-16 MED ORDER — NYSTATIN 100000 UNIT/GM EX POWD
Freq: Three times a day (TID) | CUTANEOUS | Status: DC
  Administered 2018-06-16 – 2018-06-21 (×11): via TOPICAL
  Filled 2018-06-16 (×2): qty 15

## 2018-06-16 MED ORDER — BISACODYL 10 MG RE SUPP
10.0000 mg | Freq: Every day | RECTAL | Status: DC
  Administered 2018-06-16 – 2018-06-20 (×2): 10 mg via RECTAL
  Filled 2018-06-16 (×3): qty 1

## 2018-06-16 MED ORDER — SODIUM CHLORIDE 0.9 % IV SOLN
INTRAVENOUS | Status: AC
  Administered 2018-06-16 – 2018-06-17 (×2): via INTRAVENOUS

## 2018-06-16 NOTE — Progress Notes (Addendum)
[place] [facility] [place] NAME: Deborah Obrien    MR#:  [id number]  DATE OF BIRTH:  02/16/1941  SUBJECTIVE:  patient came in with epigastric abdominal pain.  Still reporting epigastric abdominal pain nausea and 2 episodes of vomiting since last night.  Could not tolerate more than 1-2 sips of clear liquids concerned about gastric polyps.  Husband at bedside  she recently had vertebral fracture status post-kyphoplasty.   REVIEW OF SYSTEMS:   Review of Systems  Constitutional: Negative for chills, fever and weight loss.  HENT: Negative for ear discharge, ear pain and nosebleeds.   Eyes: Negative for blurred vision, pain and discharge.  Respiratory: Negative for sputum production, shortness of breath, wheezing and stridor.   Cardiovascular: Negative for chest pain, palpitations, orthopnea and PND.  Gastrointestinal: Positive for abdominal pain. Negative for diarrhea, nausea and vomiting.  Genitourinary: Negative for frequency and urgency.  Musculoskeletal: Positive for back pain. Negative for joint pain.  Neurological: Negative for sensory change, speech change, focal weakness and weakness.  Psychiatric/Behavioral: Negative for depression and hallucinations. The patient is not nervous/anxious.    Tolerating Diet:yes Tolerating PT: pending  DRUG ALLERGIES:   Allergies  Allergen Reactions  . Cephalosporins Other (See Comments)    Reaction:  Unknown   . Fish Allergy Shortness Of Breath  . Macrolides And Ketolides Other (See Comments)    Reaction:  Unknown  Other Reaction: Intolerance Other Reaction: Intolerance   . Meperidine Shortness Of Breath, Nausea Only and Other (See Comments)    Reaction:  Stomach pain   . Other Nausea Only, Rash and Other (See Comments)    Uncoded Allergy. Allergen: NON-STEROIDS, Other Reaction: Not Assessed bextra - hands and feet swell Shellfish - SOB Shellfish - SOB Other Reaction: Not Assessed  .  Prednisone Anaphylaxis and Other (See Comments)    Reaction:  Increases pts blood sugar  Pt states that she is allergic to all steroids.    . Shellfish Allergy Anaphylaxis, Shortness Of Breath, Diarrhea, Nausea Only and Rash    Reaction:  Stomach pain  Other reaction(s): Other (See Comments) Reaction:Stomach pain   . Sulfa Antibiotics Shortness Of Breath, Diarrhea, Nausea Only and Other (See Comments)    Reaction:  Stomach pain  Reaction:Stomach pain  Other Reaction: Intolerance   . Sulfacetamide Sodium Diarrhea, Nausea Only, Other (See Comments) and Shortness Of Breath    Reaction:  Stomach pain   . Telbivudine     Other reaction(s): Other (See Comments) Reaction:Unknown   . Uloric [Febuxostat] Anaphylaxis    Locks pt's body up   . Aspirin Other (See Comments)    Reaction:  Stomach pain   . Celecoxib Other (See Comments)    Reaction:  GI bleed, weakness, and stomach pain.    . Cephalexin Diarrhea, Nausea Only and Other (See Comments)    Reaction:  Stomach pain   . Erythromycin Diarrhea, Nausea Only and Other (See Comments)    Reaction:  Stomach pain   . Hydromorphone Other (See Comments)    Reaction:  Hypotension   . Iodinated Diagnostic Agents Rash and Other (See Comments)    Pt states that she is unable to have because she has chronic kidney disease.   Pt states that she is unable to have because she has chronic kidney disease. CKD CKD  . Oxycodone Other (See Comments)    Reaction:  Stomach pain   . Atorvastatin Nausea Only and Other (See Comments)  Reaction:  Weakness   . Codeine Diarrhea, Nausea Only and Other (See Comments)    Reaction:  Stomach pain Pt tolerates morphine   . Doxycycline     Stomach pain   . Tape Other (See Comments)    Reaction:  Causes pts skin to tear  Pt states that she is able to use paper tape.      . Valdecoxib Swelling and Other (See Comments)    Pt states that her hands and feet swell.    . Iodine Rash  . Morphine And  Related Rash    VITALS:  Blood pressure (!) 106/41, pulse 60, temperature 98.2 F (36.8 C), temperature source Oral, resp. rate 11, height 5\' 4"  (1.626 m), weight 107.7 kg, SpO2 100 %.  PHYSICAL EXAMINATION:   Physical Exam  GENERAL:  77 y.o.-year-old patient lying in the bed with no acute distress.  EYES: Pupils equal, round, reactive to light and accommodation. No scleral icterus. Extraocular muscles intact.  HEENT: Head atraumatic, normocephalic. Oropharynx and nasopharynx clear.  NECK:  Supple, no jugular venous distention. No thyroid enlargement, no tenderness.  LUNGS: Normal breath sounds bilaterally, no wheezing, rales, rhonchi. No use of accessory muscles of respiration. Palpable pain right mid back to right upper quadrant. No epigastric tenderness guarding or rigidity CARDIOVASCULAR: S1, S2 normal. No murmurs, rubs, or gallops.  ABDOMEN: Soft, nontender, nondistended. Bowel sounds present. No organomegaly or mass.  EXTREMITIES: No cyanosis, clubbing or edema b/l.    NEUROLOGIC: Cranial nerves II through XII are intact. No focal Motor or sensory deficits b/l.   PSYCHIATRIC:  patient is alert and oriented x 3.  SKIN: No obvious rash, lesion, or ulcer.   LABORATORY PANEL:  CBC Recent Labs  Lab 06/15/18 0846  WBC 5.2  HGB 10.5*  HCT 30.3*  PLT 203    Chemistries  Recent Labs  Lab 06/14/18 1227 06/15/18 0802  NA 131* 134*  K 4.5 4.4  CL 92* 99  CO2 25 23  GLUCOSE 249* 166*  BUN 20 21  CREATININE 1.91* 1.86*  CALCIUM 9.3 8.0*  AST 47*  --   ALT 14  --   ALKPHOS 128*  --   BILITOT 1.3*  --    Cardiac Enzymes No results for input(s): TROPONINI in the last 168 hours. RADIOLOGY:  Dg Abd Acute W/chest  Result Date: 06/16/2018 CLINICAL DATA:  Right lower quadrant abdominal pain for the past 3 days. EXAM: DG ABDOMEN ACUTE W/ 1V CHEST COMPARISON:  Abdomen pelvis CT dated 06/14/2018 and portable chest dated 04/16/2018. FINDINGS: Normal sized heart. Stable left  subclavian pacemaker leads. Small amount of linear density at the left lung base with significantly improved aeration of both lungs. Clear right lung. Minimal bilateral pleural fluid, significantly improved. Normal bowel gas pattern without free peritoneal air. Bilateral pelvic surgical clips. Cholecystectomy clips. Lumbosacral spine fixation hardware. T12 and L1 vertebral compression fractures and kyphoplasty material. IMPRESSION: 1. Resolved changes of congestive heart failure with the exception of minimal bilateral residual pleural fluid. 2. Small amount of linear atelectasis or scarring at the left lung base. 3. No acute abdominal abnormality. Electronically Signed   By: Claudie Revering M.D.   On: 06/16/2018 12:33   ASSESSMENT AND PLAN:  Deborah Obrien  is a 77 y.o. female with a known history of diabetes, hypertension, recent upper GI bleed with gastric polyp, anxiety, recent kyphoplasty presents to the emergency room with 1 week of worsening epigastric and right upper quadrant pain.  The patient went  to her GI physician Dr. Georgeann Oppenheim office who referred her to the emergency room.  A CT scan of the abdomen and pelvis has been checked and is normal.  Urinalysis shows UTI.  **Epigastric abdominal pain with nausea probably from large gastric polyp Hydrate with IV fluids EGD scheduled for Tuesday Appreciate GI recommendations  CT scan of the abdomen and pelvis is normal.  Unclear if UTI is causing her symptoms.   Continue twice daily PPI. -Patient had endoscopy done on April 12, 2018 which showed gastric polyp that was actively bleeding hemostasis was achieved. Patient feels her gastric polyp is causing pain. She wants G.I. to see her.   *UTI.    IV ciprofloxacin.  Patient has multiple allergies which limits antibiotic choices.  Urine culture ordered today which was not done before  Hold Macrobid. Patient denies any dysuria   *Constipation Stool softeners if no improvement soapsuds edema  *Acute  kidney injury over CKD stage III.  Start IV fluids.  Monitor input and output.  Repeat labs in the morning. -Admin improving. Baseline creatinine 1.5-- 1.7  *chronic low back pain-Patient has palpable pain from her right back coming to the right upper quadrant appears more musculoskeletal strain from her recent fall and vertebral fracture however cannot rule out G.I. issue as well.  *Diabetes mellitus.  Continue home insulin at lower dose.  Sliding scale insulin added  *Paroxysmal atrial fibrillation.  Continue medications and Eliquis.  Case discussed with Care Management/Social Worker. Management plans discussed with the patient, family and they are in agreement.  CODE STATUS: full  DVT Prophylaxis: lovenox  TOTAL TIME TAKING CARE OF THIS PATIENT: 33 minutes.  >50% time spent on counselling and coordination of care  POSSIBLE D/C IN 1-2 DAYS, DEPENDING ON CLINICAL CONDITION.  Note: This dictation was prepared with Dragon dictation along with smaller phrase technology. Any transcriptional errors that result from this process are unintentional.  Nicholes Mango M.D on 06/16/2018 at 2:18 PM  Between 7am to 6pm - Pager - [phone]  After 6pm go to [email or web address] - password [email or web address] [facility]  Office  [phone]  CC: Primary care physician; [name], MDPatient ID: Deborah Obrien, female   DOB: [birth year], 77 y.o.   MRN: [id number]

## 2018-06-16 NOTE — Consult Note (Signed)
Jonathon Bellows , MD [address] [place], [place], [place], [id number], [place], [place], [place], [address] Phone: [phone]  Fax: [phone]  Consultation  Referring Provider: DR Margaretmary Eddy Primary Care Physician:  [name], MD Primary Gastroenterologist:  Dr. Vicente Males          Reason for Consultation:     Abdominal pain   Date of Admission:  06/14/2018 Date of Consultation:  06/16/2018         HPI:   Deborah Obrien is a 77 y.o. female was admitted from my clinic on 06/14/18 for severe abdominal pain.    Summary of history :  I was consulted to see her on 04/05/18 when she was admitted with nausea,vomiting and found to have small bowel obstruction and was seen by surgery at that time. She had a upper endoscopy by Dr Allen Norris in 11/ 2016 for abdominal pain and had multiple gastric polyps that were biopsied and were benign. The patient has been on Eliquis that was started by her cardiologist. The patient had an NG tube placed with dark material coming out of the NG tube. The patient's baseline hemoglobin hadbeen around 13.5 and dropped to9.6.I performed an EGD on 04/12/18 and noted a lare gastric polyp that was oozing . It was treated with epinephrine and suseqeuntly sprayed with hemospray and seemed to stop the bleeding.   I have since been seeing her in clinic to decide on the timing of her recent EGD. She was admitted day before for severe generalized abdominal pain . CT scan of the abdomen showed no acute abnormality ; She had back surgery a few days prior Kyphoplasty t12- L1 .   She was found to be in AKI. INR 1.28 , lipase 20   She states that she gets abdominal pain right after she eats.  No better since she came to the hospital.  She says that this will prevent her from going home.  Denies any melena.      Past Medical History:  Diagnosis Date  . A-fib ([place] [facility]) 2016  . Acid reflux   . Anxiety   . Bowel obstruction ([place])   . CHF (congestive  heart failure) ([facility]) 2017  . Chronic kidney disease (CKD) stage G3a/A1, moderately decreased glomerular filtration rate (GFR) between 45-59 mL/min/1.73 square meter and albuminuria creatinine ratio less than 30 mg/g ([facility])   . Diabetes mellitus without complication ([facility])   . Dyspnea   . Fatty liver   . GI bleed   . Gout   . Hypertension   . Morbid obesity ([facility])   . Osteoarthritis   . Osteoporosis   . Paroxysmal atrial fibrillation ([facility])   . Presence of permanent cardiac pacemaker   . Sinus node dysfunction ([facility] [place]) 08/24/2017  . TIA (transient ischemic attack)     Past Surgical History:  Procedure Laterality Date  . ABDOMINAL HYSTERECTOMY  1980  . APPENDECTOMY    . BACK SURGERY     2008  . BREAST BIOPSY Right 08/16   stereo fibroadenomatous change, neg for atypia  . BREAST EXCISIONAL BIOPSY Left    neg  . CARDIAC CATHETERIZATION Left 08/25/2016   Procedure: Left Heart Cath and Coronary Angiography;  Surgeon: Corey Skains, MD;  Location: [facility];  Service: Cardiovascular;  Laterality: Left;  . CARDIOVERSION N/A 06/28/2017   Procedure: CARDIOVERSION;  Surgeon: Corey Skains, MD;  Location: [facility];  Service: Cardiovascular;  Laterality: N/A;  . CARDIOVERSION N/A 02/06/2018  Procedure: CARDIOVERSION;  Surgeon: Josue Hector, MD;  Location: [facility];  Service: Cardiovascular;  Laterality: N/A;  . CATARACT EXTRACTION, BILATERAL  2012  . CHOLECYSTECTOMY    . colon blockage  1999  . COLON SURGERY  1999  . COLONOSCOPY  2016   polyps removed 2016  . DORSAL COMPARTMENT RELEASE Left 09/14/2016   Procedure: RELEASE DORSAL COMPARTMENT (DEQUERVAIN);  Surgeon: Dereck Leep, MD;  Location: [facility];  Service: Orthopedics;  Laterality: Left;  . ESOPHAGOGASTRODUODENOSCOPY N/A 01/27/2015   Procedure: ESOPHAGOGASTRODUODENOSCOPY (EGD);  Surgeon: Lollie Sails, MD;  Location: [facility];  Service: Endoscopy;  Laterality: N/A;  . ESOPHAGOGASTRODUODENOSCOPY (EGD)  WITH PROPOFOL N/A 07/24/2015   Procedure: ESOPHAGOGASTRODUODENOSCOPY (EGD) WITH PROPOFOL;  Surgeon: Lucilla Lame, MD;  Location: [facility];  Service: Endoscopy;  Laterality: N/A;  . ESOPHAGOGASTRODUODENOSCOPY (EGD) WITH PROPOFOL N/A 04/12/2018   Procedure: ESOPHAGOGASTRODUODENOSCOPY (EGD) WITH PROPOFOL;  Surgeon: Jonathon Bellows, MD;  Location: [facility];  Service: Gastroenterology;  Laterality: N/A;  . JOINT REPLACEMENT  2014   Bilateral Knee replacement  . LEAD REVISION/REPAIR N/A 08/29/2017   Procedure: LEAD REVISION/REPAIR;  Surgeon: Constance Haw, MD;  Location: [facility];  Service: Cardiovascular;  Laterality: N/A;  . PACEMAKER INSERTION Left 07/01/2015   Procedure: INSERTION PACEMAKER;  Surgeon: Isaias Cowman, MD;  Location: [facility];  Service: Cardiovascular;  Laterality: Left;  . PACEMAKER REVISION N/A 08/28/2017   Procedure: PACEMAKER REVISION;  Surgeon: Deboraha Sprang, MD;  Location: [facility];  Service: Cardiovascular;  Laterality: N/A;  . REPLACEMENT TOTAL KNEE BILATERAL    . SHOULDER ARTHROSCOPY WITH SUBACROMIAL DECOMPRESSION Left 2013  . TEE WITHOUT CARDIOVERSION N/A 06/28/2017   Procedure: TRANSESOPHAGEAL ECHOCARDIOGRAM (TEE);  Surgeon: Corey Skains, MD;  Location: [facility];  Service: Cardiovascular;  Laterality: N/A;    Prior to Admission medications   Medication Sig Start Date End Date Taking? Authorizing Provider  acetaminophen (TYLENOL) 325 MG tablet Take 2 tablets (650 mg total) by mouth every 6 (six) hours as needed for mild pain (or Fever >/= 101). 04/23/18  Yes Gouru, Illene Silver, MD  allopurinol (ZYLOPRIM) 100 MG tablet Take 150 mg by mouth daily.    Yes [provider]  amiodarone (PACERONE) 200 MG tablet Take 1 tablet (200 mg total) by mouth 2 (two) times daily. 05/10/18  Yes Deboraha Sprang, MD  apixaban (ELIQUIS) 5 MG TABS tablet Take 1 tablet (5 mg total) by mouth 2 (two) times daily. 03/05/18  Yes Deboraha Sprang, MD    benzocaine-Menthol (DERMOPLAST) 20-0.5 % AERO Apply 1 application topically 4 (four) times daily as needed for irritation.   Yes [provider]  bisacodyl (DULCOLAX) 10 MG suppository Place 1 suppository (10 mg total) rectally daily as needed for moderate constipation. 04/23/18  Yes Gouru, Aruna, MD  busPIRone (BUSPAR) 10 MG tablet Take 10 mg by mouth 2 (two) times daily.    Yes [provider]  cholecalciferol (VITAMIN D) 1000 units tablet Take 1,000 Units by mouth daily.   Yes [provider]  colchicine 0.6 MG tablet Take 0.6 mg by mouth daily as needed (gout).    Yes [provider]  dibucaine (NUPERCAINAL) 1 % OINT Place 1 application rectally as needed for hemorrhoids. 04/23/18  Yes Gouru, Illene Silver, MD  docusate sodium (COLACE) 100 MG capsule Take 100 mg by mouth at bedtime.    Yes [provider]  furosemide (LASIX) 40 MG tablet Take 1 tablet (40 mg total) by mouth 2 (  two) times daily. 04/23/18  Yes Gouru, Illene Silver, MD  HYDROcodone-acetaminophen (NORCO/VICODIN) 5-325 MG tablet Take 1 tablet by mouth every 6 (six) hours as needed for moderate pain. 04/25/18 04/25/19 Yes Gerlene Fee, NP  hydrocortisone 2.5 % cream Apply 1 application topically daily as needed (rash).   Yes [provider]  insulin NPH-regular Human (NOVOLIN 70/30) (70-30) 100 UNIT/ML injection Inject 22 Units into the skin 2 (two) times daily.    Yes [provider]  ipratropium-albuterol (DUONEB) 0.5-2.5 (3) MG/3ML SOLN Take 3 mLs by nebulization 2 (two) times daily. 04/23/18  Yes Gouru, Illene Silver, MD  Multiple Vitamin (MULTIVITAMIN WITH MINERALS) TABS tablet Take 1 tablet by mouth daily. 04/24/18  Yes Gouru, Illene Silver, MD  neomycin-bacitracin-polymyxin (NEOSPORIN) ointment Apply topically as needed for wound care. 04/23/18  Yes Gouru, Illene Silver, MD  nitrofurantoin, macrocrystal-monohydrate, (MACROBID) 100 MG capsule Take 100 mg by mouth 2 (two) times daily.  06/13/18 06/20/18 Yes  [provider]  NON FORMULARY Diet Type: Mechanical Soft Chopped   Yes [provider]  oxybutynin (DITROPAN) 5 MG tablet Take 10 mg by mouth 2 (two) times daily.   Yes [provider]  OXYGEN Inhale 2 L into the lungs continuous.   Yes [provider]  pantoprazole (PROTONIX) 40 MG tablet Take 1 tablet (40 mg total) by mouth 2 (two) times daily. 12/29/17  Yes Darel Hong, MD  PARoxetine (PAXIL) 40 MG tablet Take 40 mg by mouth every morning.   Yes [provider]  Polyethyl Glycol-Propyl Glycol (SYSTANE) 0.4-0.3 % SOLN Place 1 drop into both eyes 2 (two) times daily as needed.   Yes [provider]  pravastatin (PRAVACHOL) 20 MG tablet Take 20 mg by mouth at bedtime.    Yes [provider]  talc (ZEASORB) powder Apply 1 application topically as needed (under the breast irritation).   Yes [provider]  traZODone (DESYREL) 50 MG tablet Take 50-100 mg by mouth at bedtime.  06/07/18  Yes [provider]  diphenhydrAMINE (BENADRYL) 25 mg capsule Take 1 capsule (25 mg total) by mouth at bedtime. Patient not taking: Reported on 06/15/2018 04/23/18   Nicholes Mango, MD  ferrous sulfate 325 (65 FE) MG EC tablet Take 1 tablet (325 mg total) by mouth 2 (two) times daily. 04/23/18 05/23/18  Gouru, Illene Silver, MD  lidocaine (LIDODERM) 5 % Place 1 patch onto the skin daily. Remove & Discard patch within 12 hours or as directed by MD Patient not taking: Reported on 06/15/2018 04/23/18   Nicholes Mango, MD  polyethylene glycol (MIRALAX / GLYCOLAX) packet Take 17 g by mouth daily. Patient not taking: Reported on 06/15/2018 04/24/18   Nicholes Mango, MD  senna-docusate (SENOKOT-S) 8.6-50 MG tablet Take 1 tablet by mouth every morning. 04/24/18   Gouru, Illene Silver, MD  sucralfate (CARAFATE) 1 GM/10ML suspension Take 10 mLs (1 g total) by mouth 4 (four) times daily. Patient not taking: Reported on 06/15/2018 06/14/18   Jonathon Bellows, MD    Family History    Problem Relation Age of Onset  . Diabetes Mellitus II Mother   . CAD Mother   . Heart attack Mother   . Cancer Father        skin  . Breast cancer Neg Hx      Social History   Tobacco Use  . Smoking status: Never Smoker  . Smokeless tobacco: Never Used  Substance Use Topics  . Alcohol use: No  . Drug use: No    Allergies as  of 06/14/2018 - Review Complete 06/14/2018  Allergen Reaction Noted  . Cephalosporins Other (See Comments) 01/19/2015  . Fish allergy Shortness Of Breath 11/26/2015  . Macrolides and ketolides Other (See Comments) 01/12/2015  . Meperidine Shortness Of Breath, Nausea Only, and Other (See Comments) 01/19/2015  . Other Nausea Only, Rash, and Other (See Comments) 01/12/2015  . Prednisone Anaphylaxis and Other (See Comments) 01/19/2015  . Shellfish allergy Anaphylaxis, Shortness Of Breath, Diarrhea, Nausea Only, and Rash 06/29/2015  . Sulfa antibiotics Shortness Of Breath, Diarrhea, Nausea Only, and Other (See Comments) 06/25/2013  . Sulfacetamide sodium Diarrhea, Nausea Only, Other (See Comments), and Shortness Of Breath 08/12/2015  . Telbivudine  08/12/2015  . Uloric [febuxostat] Anaphylaxis 08/22/2016  . Aspirin Other (See Comments) 01/19/2015  . Celecoxib Other (See Comments) 01/19/2015  . Cephalexin Diarrhea, Nausea Only, and Other (See Comments) 01/19/2015  . Erythromycin Diarrhea, Nausea Only, and Other (See Comments) 01/19/2015  . Hydromorphone Other (See Comments) 01/19/2015  . Iodinated diagnostic agents Rash and Other (See Comments) 01/19/2015  . Oxycodone Other (See Comments) 01/19/2015  . Atorvastatin Nausea Only and Other (See Comments) 01/19/2015  . Codeine Diarrhea, Nausea Only, and Other (See Comments) 01/19/2015  . Doxycycline  08/24/2017  . Tape Other (See Comments) 06/29/2015  . Valdecoxib Swelling and Other (See Comments) 01/19/2015  . Iodine Rash 06/29/2015  . Morphine and related Rash 06/14/2018    Review of Systems:    All  systems reviewed and negative except where noted in HPI.   Physical Exam:  Vital signs in last 24 hours: Temp:  [98.1 F (36.7 C)-98.6 F (37 C)] 98.4 F (36.9 C) (10/05 0434) Pulse Rate:  [64-76] 73 (10/05 0434) Resp:  [12-20] 18 (10/05 0434) BP: (89-112)/(31-99) 112/99 (10/05 0434) SpO2:  [99 %-100 %] 100 % (10/05 0434) Weight:  [107.7 kg] 107.7 kg (10/05 0500) Last BM Date: 06/13/18 General:   Pleasant, cooperative in NAD Head:  Normocephalic and atraumatic. Eyes:   No icterus.   Conjunctiva pink. PERRLA. Ears:  Normal auditory acuity. Neck:  Supple; no masses or thyroidomegaly Lungs: Respirations even and unlabored. Lungs clear to auscultation bilaterally.   No wheezes, crackles, or rhonchi.  Heart:  Regular rate and rhythm;  Without murmur, clicks, rubs or gallops Abdomen:  Soft, nondistended, nontender. Normal bowel sounds. No appreciable masses or hepatomegaly.  No rebound or guarding.  Neurologic:  Alert and oriented x3;  grossly normal neurologically. Skin:  Intact without significant lesions or rashes. Cervical Nodes:  No significant cervical adenopathy. Psych:  Alert and cooperative. Normal affect.  LAB RESULTS: Recent Labs    06/14/18 1227 06/15/18 0846  WBC 6.1 5.2  HGB 12.3 10.5*  HCT 35.1 30.3*  PLT 247 203   BMET Recent Labs    06/14/18 1227 06/15/18 0802  NA 131* 134*  K 4.5 4.4  CL 92* 99  CO2 25 23  GLUCOSE 249* 166*  BUN 20 21  CREATININE 1.91* 1.86*  CALCIUM 9.3 8.0*   LFT Recent Labs    06/14/18 1227  PROT 7.4  ALBUMIN 3.6  AST 47*  ALT 14  ALKPHOS 128*  BILITOT 1.3*   PT/INR Recent Labs    06/14/18 1227  LABPROT 15.9*  INR 1.28    STUDIES: Ct Abdomen Pelvis Wo Contrast  Result Date: 06/14/2018 CLINICAL DATA:  Abdominal pain with nausea and EXAM: CT ABDOMEN AND PELVIS WITHOUT CONTRAST TECHNIQUE: Multidetector CT imaging of the abdomen and pelvis was performed following the standard protocol without oral or  IV contrast.  COMPARISON:  April 04, 2018 and Jan 25, 2016 FINDINGS: Lower chest: There is atelectatic change in the left base. No lung base edema or consolidation. Pacemaker leads are attached to the right atrium and right ventricle. Hepatobiliary: No focal liver lesions are appreciable on this noncontrast enhanced study except for a small calcification in the inferior posterior right lobe of the liver, a probable tiny granuloma. Gallbladder is absent. There is no appreciable biliary duct dilatation. Pancreas: Pancreas is atrophic. No pancreatic mass or inflammatory focus evident. Spleen: No splenic lesions are evident. A small accessory spleen is seen medial to the spleen. Adrenals/Urinary Tract: Adrenals appear normal bilaterally. Kidneys bilaterally show no evident renal mass or hydronephrosis on either side. There is an extrarenal pelvis on the right, an anatomic variant. There is no evident renal or ureteral calculus on either side. Urinary bladder is midline with wall thickness within normal limits. Stomach/Bowel: There is moderate stool in the colon. There is no bowel wall or mesenteric thickening. There is no evident bowel obstruction. No free air or portal venous air. Surgical clips are again noted in the pelvic region, stable. Vascular/Lymphatic: There is aortoiliac atherosclerosis. No aneurysm evident. Major mesenteric arterial vessels appear patent on this noncontrast enhanced study. No adenopathy is evident in the abdomen or pelvis. Reproductive: Uterus is absent.  No evident pelvic mass. Other: Appendix absent. No periappendiceal region inflammatory change evident. There is no abscess or ascites evident in the abdomen or pelvis. Musculoskeletal: Patient has had previous kyphoplasty procedures at T12 and L1. There is postoperative screw and plate fixation at L4, L5, and S1. there are no blastic or lytic bone lesions. There is no intramuscular or abdominal wall lesion. IMPRESSION: 1. No evident bowel obstruction. No  abscess in the abdomen or pelvis. Postoperative clips noted in the pelvis. Appendix absent. 2. No renal or ureteral calculus on either side. No hydronephrosis. 3.  Atrophic pancreas.  No focal pancreatic lesions. 4.  Gallbladder absent.  Uterus absent. 5. Multilevel postoperative change in the lumbar spine. Status post kyphoplasty procedures at T12 and L1. 6.  Aortoiliac atherosclerosis. Aortic Atherosclerosis (ICD10-I70.0). Electronically Signed   By: Lowella Grip III M.D.   On: 06/14/2018 13:39      Impression / Plan:   Deborah Obrien is a 77 y.o. y/o female admitted a few months back in the hospital with a severe upper GI bleed.  I performed an upper endoscopy and noted a large gastric polyp that was bleeding.  At that point of time I sprayed hemospermia and obtained hemostasis.  She is on Eliquis for atrial fibrillation and she has a history of strokes.  She is presently admitted with abdominal pain worse on eating.  2 issues that we are dealing with at this point of time.  One her abdominal pain.  Imaging has not shown any gross abnormality at this point of time.  The differentials are gastritis/peptic ulcer/biliary origin.  She also has a polyp in her stomach which sooner rather than later needs to be taken out as she is at increased risk of bleeding from the Eliquis.    We have discussed the timing of her endoscopy in depth and she is higher risk than normal due to her heart failure and poor lung function and need for 24 hours of oxygen.  On the other hand if we did not do the procedure there is always a chance that the Eliquis will make her bleed at some point of time.  Weighing the risks versus benefits we have come to the decision that we will give endoscopy and attempt on Tuesday when she is off Eliquis for 2 days.  Will attempt to resect the Gastric polyp.  If there is no other reason which is revealed for her abdominal pain then a biliary work-up will probably also be needed in terms of  a probably a HIDA scan/right upper quadrant ultrasound to rule out any choledocholithiasis.  Thank you for involving me in the care of this patient.    I have discussed alternative options, risks & benefits,  which include, but are not limited to, bleeding, infection, perforation,respiratory complication & drug reaction.  The patient agrees with this plan & written consent will be obtained.       LOS: 2 days   Jonathon Bellows, MD  06/16/2018, 11:37 AM

## 2018-06-16 NOTE — Progress Notes (Signed)
Verbal order from GI that pt should not take Eliquis PO the next two days due to procedure on Tuesday. MD and on coming shift RN made aware.   Shacora Zynda [employer]

## 2018-06-17 ENCOUNTER — Inpatient Hospital Stay: Payer: Medicare Other

## 2018-06-17 DIAGNOSIS — R945 Abnormal results of liver function studies: Secondary | ICD-10-CM

## 2018-06-17 DIAGNOSIS — R932 Abnormal findings on diagnostic imaging of liver and biliary tract: Secondary | ICD-10-CM

## 2018-06-17 LAB — GLUCOSE, CAPILLARY
GLUCOSE-CAPILLARY: 198 mg/dL — AB (ref 70–99)
GLUCOSE-CAPILLARY: 201 mg/dL — AB (ref 70–99)
Glucose-Capillary: 219 mg/dL — ABNORMAL HIGH (ref 70–99)
Glucose-Capillary: 267 mg/dL — ABNORMAL HIGH (ref 70–99)
Glucose-Capillary: 138 mg/dL — ABNORMAL HIGH (ref 70–99)

## 2018-06-17 MED ORDER — MORPHINE SULFATE (PF) 2 MG/ML IV SOLN
2.0000 mg | Freq: Four times a day (QID) | INTRAVENOUS | Status: DC | PRN
  Administered 2018-06-17 – 2018-06-22 (×12): 2 mg via INTRAVENOUS
  Filled 2018-06-17 (×14): qty 1

## 2018-06-17 NOTE — Progress Notes (Signed)
[facility] [place] NAME: Deborah Obrien    MR#:  [id number]  DATE OF BIRTH:  [birth year]  SUBJECTIVE:  patient came in with epigastric abdominal pain.  Still reporting epigastric abdominal pain nausea and right upper quadrant abdominal pain 9 out of 10.  Gets worse after she eats  husband at bedside  she recently had vertebral fracture status post-kyphoplasty.   REVIEW OF SYSTEMS:   Review of Systems  Constitutional: Negative for chills, fever and weight loss.  HENT: Negative for ear discharge, ear pain and nosebleeds.   Eyes: Negative for blurred vision, pain and discharge.  Respiratory: Negative for sputum production, shortness of breath, wheezing and stridor.   Cardiovascular: Negative for chest pain, palpitations, orthopnea and PND.  Gastrointestinal: Positive for abdominal pain. Negative for diarrhea, nausea and vomiting.  Genitourinary: Negative for frequency and urgency.  Musculoskeletal: Positive for back pain. Negative for joint pain.  Neurological: Negative for sensory change, speech change, focal weakness and weakness.  Psychiatric/Behavioral: Negative for depression and hallucinations. The patient is not nervous/anxious.    Tolerating Diet:yes Tolerating PT: pending  DRUG ALLERGIES:   Allergies  Allergen Reactions  . Cephalosporins Other (See Comments)    Reaction:  Unknown   . Fish Allergy Shortness Of Breath  . Macrolides And Ketolides Other (See Comments)    Reaction:  Unknown  Other Reaction: Intolerance Other Reaction: Intolerance   . Meperidine Shortness Of Breath, Nausea Only and Other (See Comments)    Reaction:  Stomach pain   . Other Nausea Only, Rash and Other (See Comments)    Uncoded Allergy. Allergen: NON-STEROIDS, Other Reaction: Not Assessed bextra - hands and feet swell Shellfish - SOB Shellfish - SOB Other Reaction: Not Assessed  . Prednisone Anaphylaxis and Other (See Comments)   Reaction:  Increases pts blood sugar  Pt states that she is allergic to all steroids.    . Shellfish Allergy Anaphylaxis, Shortness Of Breath, Diarrhea, Nausea Only and Rash    Reaction:  Stomach pain  Other reaction(s): Other (See Comments) Reaction:Stomach pain   . Sulfa Antibiotics Shortness Of Breath, Diarrhea, Nausea Only and Other (See Comments)    Reaction:  Stomach pain  Reaction:Stomach pain  Other Reaction: Intolerance   . Sulfacetamide Sodium Diarrhea, Nausea Only, Other (See Comments) and Shortness Of Breath    Reaction:  Stomach pain   . Telbivudine     Other reaction(s): Other (See Comments) Reaction:Unknown   . Uloric [Febuxostat] Anaphylaxis    Locks pt's body up   . Aspirin Other (See Comments)    Reaction:  Stomach pain   . Celecoxib Other (See Comments)    Reaction:  GI bleed, weakness, and stomach pain.    . Cephalexin Diarrhea, Nausea Only and Other (See Comments)    Reaction:  Stomach pain   . Erythromycin Diarrhea, Nausea Only and Other (See Comments)    Reaction:  Stomach pain   . Hydromorphone Other (See Comments)    Reaction:  Hypotension   . Iodinated Diagnostic Agents Rash and Other (See Comments)    Pt states that she is unable to have because she has chronic kidney disease.   Pt states that she is unable to have because she has chronic kidney disease. CKD CKD  . Oxycodone Other (See Comments)    Reaction:  Stomach pain   . Atorvastatin Nausea Only and Other (See Comments)    Reaction:  Weakness   . Codeine Diarrhea, Nausea  Only and Other (See Comments)    Reaction:  Stomach pain Pt tolerates morphine   . Doxycycline     Stomach pain   . Tape Other (See Comments)    Reaction:  Causes pts skin to tear  Pt states that she is able to use paper tape.      . Valdecoxib Swelling and Other (See Comments)    Pt states that her hands and feet swell.    . Iodine Rash  . Morphine And Related Rash    VITALS:  Blood pressure (!) 107/44,  pulse 66, temperature 98.6 F (37 C), temperature source Oral, resp. rate (!) 22, height 5\' 4"  (1.626 m), weight 106.1 kg, SpO2 100 %.  PHYSICAL EXAMINATION:   Physical Exam  GENERAL:  77 y.o.-year-old patient lying in the bed with no acute distress.  EYES: Pupils equal, round, reactive to light and accommodation. No scleral icterus. Extraocular muscles intact.  HEENT: Head atraumatic, normocephalic. Oropharynx and nasopharynx clear.  NECK:  Supple, no jugular venous distention. No thyroid enlargement, no tenderness.  LUNGS: Normal breath sounds bilaterally, no wheezing, rales, rhonchi. No use of accessory muscles of respiration.  CARDIOVASCULAR: S1, S2 normal. No murmurs, rubs, or gallops.  ABDOMEN: Soft, epigastric and right upper quadrant tenderness nondistended. Bowel sounds present.  EXTREMITIES: No cyanosis, clubbing or edema b/l.    NEUROLOGIC: Cranial nerves II through XII are intact. No focal Motor or sensory deficits b/l.   PSYCHIATRIC:  patient is alert and oriented x 3.  SKIN: No obvious rash, lesion, or ulcer.   LABORATORY PANEL:  CBC Recent Labs  Lab 06/15/18 0846  WBC 5.2  HGB 10.5*  HCT 30.3*  PLT 203    Chemistries  Recent Labs  Lab 06/14/18 1227 06/15/18 0802  NA 131* 134*  K 4.5 4.4  CL 92* 99  CO2 25 23  GLUCOSE 249* 166*  BUN 20 21  CREATININE 1.91* 1.86*  CALCIUM 9.3 8.0*  AST 47*  --   ALT 14  --   ALKPHOS 128*  --   BILITOT 1.3*  --    Cardiac Enzymes No results for input(s): TROPONINI in the last 168 hours. RADIOLOGY:  Dg Abd Acute W/chest  Result Date: 06/16/2018 CLINICAL DATA:  Right lower quadrant abdominal pain for the past 3 days. EXAM: DG ABDOMEN ACUTE W/ 1V CHEST COMPARISON:  Abdomen pelvis CT dated 06/14/2018 and portable chest dated 04/16/2018. FINDINGS: Normal sized heart. Stable left subclavian pacemaker leads. Small amount of linear density at the left lung base with significantly improved aeration of both lungs. Clear right  lung. Minimal bilateral pleural fluid, significantly improved. Normal bowel gas pattern without free peritoneal air. Bilateral pelvic surgical clips. Cholecystectomy clips. Lumbosacral spine fixation hardware. T12 and L1 vertebral compression fractures and kyphoplasty material. IMPRESSION: 1. Resolved changes of congestive heart failure with the exception of minimal bilateral residual pleural fluid. 2. Small amount of linear atelectasis or scarring at the left lung base. 3. No acute abdominal abnormality. Electronically Signed   By: Claudie Revering M.D.   On: 06/16/2018 12:33   US Abdomen Limited Ruq  Result Date: 06/17/2018 CLINICAL DATA:  Abnormal liver function tests. History of cholecystectomy. EXAM: ULTRASOUND ABDOMEN LIMITED RIGHT UPPER QUADRANT COMPARISON:  06/14/2018 CT abdomen/pelvis. FINDINGS: Gallbladder: Surgically absent. Common bile duct: Diameter: 9 mm Liver: No focal lesion identified. Within normal limits in parenchymal echogenicity. Portal vein is patent on color Doppler imaging with normal direction of blood flow towards the liver. IMPRESSION: 1.  Bile duct are within normal post cholecystectomy limits with CBD diameter 9 mm. 2. Normal liver. Electronically Signed   By: Ilona Sorrel M.D.   On: 06/17/2018 09:54   ASSESSMENT AND PLAN:  Kayline Sheer  is a 77 y.o. female with a known history of diabetes, hypertension, recent upper GI bleed with gastric polyp, anxiety, recent kyphoplasty presents to the emergency room with 1 week of worsening epigastric and right upper quadrant pain.  The patient went to her GI physician Dr. Georgeann Oppenheim office who referred her to the emergency room.  A CT scan of the abdomen and pelvis has been checked and is normal.  Urinalysis shows UTI.  **Epigastric abdominal pain, right upper quadrant abdominal pain with nausea probably from large gastric polyp Hydrate with IV fluids EGD scheduled for Tuesday Appreciate GI recommendations  CT scan of the abdomen and pelvis  is normal.   Right upper quadrant ultrasound with dilated common bile duct and 9 mm.  MRCP could not done as the patient has pacemaker.  If patient can tolerate soft diet and is pain-free GI is considering outpatient EUS to evaluate the enlarged enlarged common bile duct, if patient is consistently symptomatic with abdominal pain and elevated LFTs she might need EUS sooner to rule out stones in the common bile duct and might need ERCP if CBD stones are present   Continue twice daily PPI. -Patient had endoscopy done on April 12, 2018 which showed gastric polyp that was actively bleeding hemostasis was achieved. Patient feels her gastric polyp is causing pain. She wants G.I. to see her.   *UTI.    IV ciprofloxacin.  Patient has multiple allergies which limits antibiotic choices.  Urine culture result pending  Hold Macrobid. Patient denies any dysuria   *Constipation Stool softeners   *Acute kidney injury over CKD stage III.  Start IV fluids.  Monitor input and output.  Repeat labs in the morning. -. Baseline creatinine 1.5-- 1.7;  -Creatinine 1.91-1.86 Gentle hydration with IV fluids  *chronic low back pain-Patient has palpable pain from her right back coming to the right upper quadrant appears more musculoskeletal strain from her recent fall and vertebral fracture however cannot rule out G.I. issue as well.  *Diabetes mellitus.  Continue home insulin at lower dose.  Sliding scale insulin added  *Paroxysmal atrial fibrillation.  Continue medications and Eliquis on hold for anticipated EGD on Tuesday.  Case discussed with Care Management/Social Worker. Management plans discussed with the patient, family and they are in agreement.  CODE STATUS: full  DVT Prophylaxis: lovenox  TOTAL TIME TAKING CARE OF THIS PATIENT: 33 minutes.  >50% time spent on counselling and coordination of care  POSSIBLE D/C IN 1-2 DAYS, DEPENDING ON CLINICAL CONDITION.  Note: This dictation was prepared  with Dragon dictation along with smaller phrase technology. Any transcriptional errors that result from this process are unintentional.  Nicholes Mango M.D on 06/17/2018 at 10:48 PM  Between 7am to 6pm - Pager - [phone]  After 6pm go to [email or web address] - password [email or web address] [facility]  Office  [phone]  CC: Primary care physician; [name], MDPatient ID: Deborah Obrien, female   DOB: 1941/08/05, 77 y.o.   MRN: [id number]

## 2018-06-17 NOTE — Progress Notes (Addendum)
Per Gwyn with lab stat urine culture was received 06/16/2018. Culture sent to [facility] for testing. Test pending.

## 2018-06-17 NOTE — Progress Notes (Signed)
Deborah Obrien , MD [address], [place], [place], [place], [address] [address], [place], [place], [place], [address] Phone: [phone]  Fax: [phone]   Deborah Obrien is being followed for acute abdominal pain, gastric polyp.  Subjective: Still having epigastric and right upper quadrant pain when she eats.  Objective: Vital signs in last 24 hours: Vitals:   06/16/18 1942 06/16/18 2028 06/17/18 0506 06/17/18 1156  BP: 96/65  (!) 116/51 (!) 123/43  Pulse: 63  64 62  Resp: 18  18 14   Temp: (!) 97.4 F (36.3 C)  97.9 F (36.6 C) 98.4 F (36.9 C)  TempSrc: Oral  Oral Oral  SpO2: 100% 100% 100% 100%  Weight:   106.1 kg   Height:       Weight change: -1.604 kg  Intake/Output Summary (Last 24 hours) at 06/17/2018 1425 Last data filed at 06/17/2018 1200 Gross per 24 hour  Intake 1667.6 ml  Output 900 ml  Net 767.6 ml     Exam: Heart:: Regular rate and rhythm, S1S2 present or without murmur or extra heart sounds Lungs: normal, clear to auscultation and clear to auscultation and percussion Abdomen: soft, nontender, normal bowel sounds   Lab Results: @LABTEST2 @ Micro Results: No results found for this or any previous visit (from the past 240 hour(s)). Studies/Results: Dg Abd Acute W/chest  Result Date: 06/16/2018 CLINICAL DATA:  Right lower quadrant abdominal pain for the past 3 days. EXAM: DG ABDOMEN ACUTE W/ 1V CHEST COMPARISON:  Abdomen pelvis CT dated 06/14/2018 and portable chest dated 04/16/2018. FINDINGS: Normal sized heart. Stable left subclavian pacemaker leads. Small amount of linear density at the left lung base with significantly improved aeration of both lungs. Clear right lung. Minimal bilateral pleural fluid, significantly improved. Normal bowel gas pattern without free peritoneal air. Bilateral pelvic surgical clips. Cholecystectomy clips. Lumbosacral spine fixation hardware. T12 and L1 vertebral compression fractures and kyphoplasty material.  IMPRESSION: 1. Resolved changes of congestive heart failure with the exception of minimal bilateral residual pleural fluid. 2. Small amount of linear atelectasis or scarring at the left lung base. 3. No acute abdominal abnormality. Electronically Signed   By: Claudie Revering M.D.   On: 06/16/2018 12:33   US Abdomen Limited Ruq  Result Date: 06/17/2018 CLINICAL DATA:  Abnormal liver function tests. History of cholecystectomy. EXAM: ULTRASOUND ABDOMEN LIMITED RIGHT UPPER QUADRANT COMPARISON:  06/14/2018 CT abdomen/pelvis. FINDINGS: Gallbladder: Surgically absent. Common bile duct: Diameter: 9 mm Liver: No focal lesion identified. Within normal limits in parenchymal echogenicity. Portal vein is patent on color Doppler imaging with normal direction of blood flow towards the liver. IMPRESSION: 1. Bile duct are within normal post cholecystectomy limits with CBD diameter 9 mm. 2. Normal liver. Electronically Signed   By: Ilona Sorrel M.D.   On: 06/17/2018 09:54   Medications: I have reviewed the patient's current medications. Scheduled Meds: . allopurinol  150 mg Oral Daily  . amiodarone  200 mg Oral BID  . bisacodyl  10 mg Rectal Daily  . busPIRone  10 mg Oral BID  . ciprofloxacin  250 mg Oral BID  . diphenhydrAMINE  25 mg Oral QHS  . docusate sodium  100 mg Oral QHS  . insulin aspart  0-5 Units Subcutaneous QHS  . insulin aspart  0-9 Units Subcutaneous TID WC  . ipratropium-albuterol  3 mL Nebulization BID  . nystatin   Topical TID  . oxybutynin  2.5 mg Oral QHS  . pantoprazole  40 mg Oral  BID  . PARoxetine  40 mg Oral BH-q7a  . polyethylene glycol  17 g Oral Daily  . pravastatin  20 mg Oral QHS  . senna-docusate  1 tablet Oral q morning - 10a  . sucralfate  1 g Oral QID   Continuous Infusions: PRN Meds:.acetaminophen **OR** acetaminophen, albuterol, colchicine, HYDROcodone-acetaminophen, morphine injection, ondansetron **OR** ondansetron (ZOFRAN) IV, phenol, polyethylene  glycol   Assessment: Active Problems:   UTI (urinary tract infection)  Hepatic Function Latest Ref Rng & Units 06/14/2018 05/02/2018 04/20/2018  Total Protein 6.5 - 8.1 g/dL 7.4 7.4 6.2(L)  Albumin 3.5 - 5.0 g/dL 3.6 3.1(L) 2.5(L)  AST 15 - 41 U/L 47(H) 38 30  ALT 0 - 44 U/L 14 14 <5  Alk Phosphatase 38 - 126 U/L 128(H) 163(H) 124  Total Bilirubin 0.3 - 1.2 mg/dL 1.3(H) 1.3(H) 1.0  Bilirubin, Direct 0.0 - 0.2 mg/dL - - -    Deborah Obrien is a 77 y.o. y/o female admitted a few months back in the hospital with a severe upper GI bleed.  I performed an upper endoscopy and noted a large gastric polyp that was bleeding.  At that point of time I sprayed hemospermia and obtained hemostasis.  She is on Eliquis for atrial fibrillation and she has a history of strokes.  She has been presently admitted with abdominal pain worse on eating.  2 issues that we are dealing with at this point of time.  One her abdominal pain, it is possible that her pain is biliary in origin.  It is worse when she eats there is a dilation of a common bile duct although she is status post cholecystectomy.  There is elevation in her liver function tests with alkaline phosphatase and total bilirubin being elevated.  There is a possibility she may have a stone in the common bile duct the pretest probability based on her biochemical numbers are intermediate  She also has a polyp in her stomach which sooner rather than later needs to be taken out as she is at increased risk of bleeding from the Eliquis.    1.  Initially I plan to resect a gastric polyp on Tuesday but if she has ongoing epigastric and right upper quadrant pain and the liver test stay elevated she may need endoscopic ultrasound sooner to rule out stones in the common bile duct and if present she will need subsequently an ERCP  2. Elevated alk phos - RUQ USG shows CBD dilated at 9 mm, mildly elevated total bilirubin at 1.3.  S/p cholecystectomy. Cant have MRCP due to  pacemaker.  If she can tolerate an oral diet and is pain-free then can have an outpatient EUS to evaluate the common bile duct further.  Thank you for involving me in the care of this patient.    I have discussed alternative options, risks & benefits,  which include, but are not limited to, bleeding, infection, perforation,respiratory complication & drug reaction.  The patient agrees with this plan & written consent will be obtained.     LOS: 3 days   Deborah Bellows, MD 06/17/2018, 2:25 PM

## 2018-06-17 NOTE — H&P (View-Only) (Signed)
Deborah Obrien , MD [address], [place], [place], [place], [address] [address], [place], [place], [place], [address] Phone: [phone]  Fax: [phone]   Deborah Obrien is being followed for acute abdominal pain, gastric polyp.  Subjective: Still having epigastric and right upper quadrant pain when she eats.  Objective: Vital signs in last 24 hours: Vitals:   06/16/18 1942 06/16/18 2028 06/17/18 0506 06/17/18 1156  BP: 96/65  (!) 116/51 (!) 123/43  Pulse: 63  64 62  Resp: 18  18 14   Temp: (!) 97.4 F (36.3 C)  97.9 F (36.6 C) 98.4 F (36.9 C)  TempSrc: Oral  Oral Oral  SpO2: 100% 100% 100% 100%  Weight:   106.1 kg   Height:       Weight change: -1.604 kg  Intake/Output Summary (Last 24 hours) at 06/17/2018 1425 Last data filed at 06/17/2018 1200 Gross per 24 hour  Intake 1667.6 ml  Output 900 ml  Net 767.6 ml     Exam: Heart:: Regular rate and rhythm, S1S2 present or without murmur or extra heart sounds Lungs: normal, clear to auscultation and clear to auscultation and percussion Abdomen: soft, nontender, normal bowel sounds   Lab Results: @LABTEST2 @ Micro Results: No results found for this or any previous visit (from the past 240 hour(s)). Studies/Results: Dg Abd Acute W/chest  Result Date: 06/16/2018 CLINICAL DATA:  Right lower quadrant abdominal pain for the past 3 days. EXAM: DG ABDOMEN ACUTE W/ 1V CHEST COMPARISON:  Abdomen pelvis CT dated 06/14/2018 and portable chest dated 04/16/2018. FINDINGS: Normal sized heart. Stable left subclavian pacemaker leads. Small amount of linear density at the left lung base with significantly improved aeration of both lungs. Clear right lung. Minimal bilateral pleural fluid, significantly improved. Normal bowel gas pattern without free peritoneal air. Bilateral pelvic surgical clips. Cholecystectomy clips. Lumbosacral spine fixation hardware. T12 and L1 vertebral compression fractures and kyphoplasty material.  IMPRESSION: 1. Resolved changes of congestive heart failure with the exception of minimal bilateral residual pleural fluid. 2. Small amount of linear atelectasis or scarring at the left lung base. 3. No acute abdominal abnormality. Electronically Signed   By: Claudie Revering M.D.   On: 06/16/2018 12:33   US Abdomen Limited Ruq  Result Date: 06/17/2018 CLINICAL DATA:  Abnormal liver function tests. History of cholecystectomy. EXAM: ULTRASOUND ABDOMEN LIMITED RIGHT UPPER QUADRANT COMPARISON:  06/14/2018 CT abdomen/pelvis. FINDINGS: Gallbladder: Surgically absent. Common bile duct: Diameter: 9 mm Liver: No focal lesion identified. Within normal limits in parenchymal echogenicity. Portal vein is patent on color Doppler imaging with normal direction of blood flow towards the liver. IMPRESSION: 1. Bile duct are within normal post cholecystectomy limits with CBD diameter 9 mm. 2. Normal liver. Electronically Signed   By: Ilona Sorrel M.D.   On: 06/17/2018 09:54   Medications: I have reviewed the patient's current medications. Scheduled Meds: . allopurinol  150 mg Oral Daily  . amiodarone  200 mg Oral BID  . bisacodyl  10 mg Rectal Daily  . busPIRone  10 mg Oral BID  . ciprofloxacin  250 mg Oral BID  . diphenhydrAMINE  25 mg Oral QHS  . docusate sodium  100 mg Oral QHS  . insulin aspart  0-5 Units Subcutaneous QHS  . insulin aspart  0-9 Units Subcutaneous TID WC  . ipratropium-albuterol  3 mL Nebulization BID  . nystatin   Topical TID  . oxybutynin  2.5 mg Oral QHS  . pantoprazole  40 mg Oral  BID  . PARoxetine  40 mg Oral BH-q7a  . polyethylene glycol  17 g Oral Daily  . pravastatin  20 mg Oral QHS  . senna-docusate  1 tablet Oral q morning - 10a  . sucralfate  1 g Oral QID   Continuous Infusions: PRN Meds:.acetaminophen **OR** acetaminophen, albuterol, colchicine, HYDROcodone-acetaminophen, morphine injection, ondansetron **OR** ondansetron (ZOFRAN) IV, phenol, polyethylene  glycol   Assessment: Active Problems:   UTI (urinary tract infection)  Hepatic Function Latest Ref Rng & Units 06/14/2018 05/02/2018 04/20/2018  Total Protein 6.5 - 8.1 g/dL 7.4 7.4 6.2(L)  Albumin 3.5 - 5.0 g/dL 3.6 3.1(L) 2.5(L)  AST 15 - 41 U/L 47(H) 38 30  ALT 0 - 44 U/L 14 14 <5  Alk Phosphatase 38 - 126 U/L 128(H) 163(H) 124  Total Bilirubin 0.3 - 1.2 mg/dL 1.3(H) 1.3(H) 1.0  Bilirubin, Direct 0.0 - 0.2 mg/dL - - -    Deborah Obrien is a 77 y.o. y/o female admitted a few months back in the hospital with a severe upper GI bleed.  I performed an upper endoscopy and noted a large gastric polyp that was bleeding.  At that point of time I sprayed hemospermia and obtained hemostasis.  She is on Eliquis for atrial fibrillation and she has a history of strokes.  She has been presently admitted with abdominal pain worse on eating.  2 issues that we are dealing with at this point of time.  One her abdominal pain, it is possible that her pain is biliary in origin.  It is worse when she eats there is a dilation of a common bile duct although she is status post cholecystectomy.  There is elevation in her liver function tests with alkaline phosphatase and total bilirubin being elevated.  There is a possibility she may have a stone in the common bile duct the pretest probability based on her biochemical numbers are intermediate  She also has a polyp in her stomach which sooner rather than later needs to be taken out as she is at increased risk of bleeding from the Eliquis.    1.  Initially I plan to resect a gastric polyp on Tuesday but if she has ongoing epigastric and right upper quadrant pain and the liver test stay elevated she may need endoscopic ultrasound sooner to rule out stones in the common bile duct and if present she will need subsequently an ERCP  2. Elevated alk phos - RUQ USG shows CBD dilated at 9 mm, mildly elevated total bilirubin at 1.3.  S/p cholecystectomy. Cant have MRCP due to  pacemaker.  If she can tolerate an oral diet and is pain-free then can have an outpatient EUS to evaluate the common bile duct further.  Thank you for involving me in the care of this patient.    I have discussed alternative options, risks & benefits,  which include, but are not limited to, bleeding, infection, perforation,respiratory complication & drug reaction.  The patient agrees with this plan & written consent will be obtained.     LOS: 3 days   Deborah Bellows, MD 06/17/2018, 2:25 PM

## 2018-06-18 DIAGNOSIS — R1084 Generalized abdominal pain: Secondary | ICD-10-CM

## 2018-06-18 LAB — COMPREHENSIVE METABOLIC PANEL
ALBUMIN: 3 g/dL — AB (ref 3.5–5.0)
ALK PHOS: 135 U/L — AB (ref 38–126)
ALT: 19 U/L (ref 0–44)
AST: 39 U/L (ref 15–41)
Anion gap: 6 (ref 5–15)
BUN: 14 mg/dL (ref 8–23)
CALCIUM: 8.5 mg/dL — AB (ref 8.9–10.3)
CO2: 28 mmol/L (ref 22–32)
CREATININE: 1.27 mg/dL — AB (ref 0.44–1.00)
Chloride: 102 mmol/L (ref 98–111)
GFR calc Af Amer: 46 mL/min — ABNORMAL LOW (ref >60)
GFR calc non Af Amer: 40 mL/min — ABNORMAL LOW (ref >60)
GLUCOSE: 171 mg/dL — AB (ref 70–99)
Potassium: 4.6 mmol/L (ref 3.5–5.1)
SODIUM: 136 mmol/L (ref 135–145)
Total Bilirubin: 0.9 mg/dL (ref 0.3–1.2)
Total Protein: 5.9 g/dL — ABNORMAL LOW (ref 6.5–8.1)

## 2018-06-18 LAB — GLUCOSE, CAPILLARY
GLUCOSE-CAPILLARY: 154 mg/dL — AB (ref 70–99)
GLUCOSE-CAPILLARY: 169 mg/dL — AB (ref 70–99)
GLUCOSE-CAPILLARY: 233 mg/dL — AB (ref 70–99)
Glucose-Capillary: 200 mg/dL — ABNORMAL HIGH (ref 70–99)
Glucose-Capillary: 224 mg/dL — ABNORMAL HIGH (ref 70–99)

## 2018-06-18 LAB — URINE CULTURE: SPECIAL REQUESTS: NORMAL

## 2018-06-18 LAB — CBC
HCT: 29.9 % — ABNORMAL LOW (ref 35.0–47.0)
HEMOGLOBIN: 9.9 g/dL — AB (ref 12.0–16.0)
MCH: 30.9 pg (ref 26.0–34.0)
MCHC: 33 g/dL (ref 32.0–36.0)
MCV: 93.7 fL (ref 80.0–100.0)
Platelets: 192 10*3/uL (ref 150–440)
RBC: 3.2 MIL/uL — ABNORMAL LOW (ref 3.80–5.20)
RDW: 14.6 % — ABNORMAL HIGH (ref 11.5–14.5)
WBC: 3.5 10*3/uL — AB (ref 3.6–11.0)

## 2018-06-18 MED ORDER — MAGNESIUM CITRATE PO SOLN
1.0000 | Freq: Once | ORAL | Status: DC
  Filled 2018-06-18 (×2): qty 296

## 2018-06-18 MED ORDER — POLYETHYLENE GLYCOL 3350 17 G PO PACK
34.0000 g | PACK | Freq: Every day | ORAL | Status: DC
  Administered 2018-06-19 – 2018-06-20 (×2): 34 g via ORAL
  Filled 2018-06-18 (×2): qty 2

## 2018-06-18 NOTE — Progress Notes (Signed)
Inpatient Diabetes Program Recommendations  AACE/ADA: New Consensus Statement on Inpatient Glycemic Control (2015)  Target Ranges:  Prepandial:   less than 140 mg/dL      Peak postprandial:   less than 180 mg/dL (1-2 hours)      Critically ill patients:  140 - 180 mg/dL   Lab Results  Component Value Date   GLUCAP 224 (H) 06/18/2018   HGBA1C 6.7 (H) 06/14/2018    Review of Glycemic ControlResults for Deborah Obrien, Deborah Obrien (MRN [id number]) as of 06/18/2018 12:18  Ref. Range 06/17/2018 16:50 06/17/2018 21:19 06/18/2018 03:15 06/18/2018 07:43 06/18/2018 11:37  Glucose-Capillary Latest Ref Range: 70 - 99 mg/dL 138 (H) 201 (H) 154 (H) 200 (H) 224 (H)    Diabetes history: Type 2 DM  Outpatient Diabetes medications: Novolin 70/30 22 units bid Current orders for Inpatient glycemic control:  Novolog sensitive tid with meals and HS Inpatient Diabetes Program Recommendations:   Please consider adding Lantus 10 units daily.    Thanks,  Adah Perl, RN, BC-ADM Inpatient Diabetes Coordinator Pager [phone] (8a-5p)

## 2018-06-18 NOTE — Progress Notes (Signed)
[place] [facility] [place] NAME: Deborah Obrien    MR#:  [id number]  DATE OF BIRTH:  25-Jul-1941  SUBJECTIVE:  patient came in with epigastric abdominal pain.  Still reporting epigastric abdominal pain nausea and right upper quadrant abdominal pain 9 out of 10.  Gets worse after she eats  husband at bedside.  Constipated.  she recently had vertebral fracture status post-kyphoplasty.   REVIEW OF SYSTEMS:   Review of Systems  Constitutional: Negative for chills, fever and weight loss.  HENT: Negative for ear discharge, ear pain and nosebleeds.   Eyes: Negative for blurred vision, pain and discharge.  Respiratory: Negative for sputum production, shortness of breath, wheezing and stridor.   Cardiovascular: Negative for chest pain, palpitations, orthopnea and PND.  Gastrointestinal: Positive for abdominal pain. Negative for diarrhea, nausea and vomiting.  Genitourinary: Negative for frequency and urgency.  Musculoskeletal: Positive for back pain. Negative for joint pain.  Neurological: Negative for sensory change, speech change, focal weakness and weakness.  Psychiatric/Behavioral: Negative for depression and hallucinations. The patient is not nervous/anxious.    Tolerating Diet:yes Tolerating PT: pending  DRUG ALLERGIES:   Allergies  Allergen Reactions  . Cephalosporins Other (See Comments)    Reaction:  Unknown   . Fish Allergy Shortness Of Breath  . Macrolides And Ketolides Other (See Comments)    Reaction:  Unknown  Other Reaction: Intolerance Other Reaction: Intolerance   . Meperidine Shortness Of Breath, Nausea Only and Other (See Comments)    Reaction:  Stomach pain   . Other Nausea Only, Rash and Other (See Comments)    Uncoded Allergy. Allergen: NON-STEROIDS, Other Reaction: Not Assessed bextra - hands and feet swell Shellfish - SOB Shellfish - SOB Other Reaction: Not Assessed  . Prednisone Anaphylaxis and Other (See  Comments)    Reaction:  Increases pts blood sugar  Pt states that she is allergic to all steroids.    . Shellfish Allergy Anaphylaxis, Shortness Of Breath, Diarrhea, Nausea Only and Rash    Reaction:  Stomach pain  Other reaction(s): Other (See Comments) Reaction:Stomach pain   . Sulfa Antibiotics Shortness Of Breath, Diarrhea, Nausea Only and Other (See Comments)    Reaction:  Stomach pain  Reaction:Stomach pain  Other Reaction: Intolerance   . Sulfacetamide Sodium Diarrhea, Nausea Only, Other (See Comments) and Shortness Of Breath    Reaction:  Stomach pain   . Telbivudine     Other reaction(s): Other (See Comments) Reaction:Unknown   . Uloric [Febuxostat] Anaphylaxis    Locks pt's body up   . Aspirin Other (See Comments)    Reaction:  Stomach pain   . Celecoxib Other (See Comments)    Reaction:  GI bleed, weakness, and stomach pain.    . Cephalexin Diarrhea, Nausea Only and Other (See Comments)    Reaction:  Stomach pain   . Erythromycin Diarrhea, Nausea Only and Other (See Comments)    Reaction:  Stomach pain   . Hydromorphone Other (See Comments)    Reaction:  Hypotension   . Iodinated Diagnostic Agents Rash and Other (See Comments)    Pt states that she is unable to have because she has chronic kidney disease.   Pt states that she is unable to have because she has chronic kidney disease. CKD CKD  . Oxycodone Other (See Comments)    Reaction:  Stomach pain   . Atorvastatin Nausea Only and Other (See Comments)    Reaction:  Weakness   .  Codeine Diarrhea, Nausea Only and Other (See Comments)    Reaction:  Stomach pain Pt tolerates morphine   . Doxycycline     Stomach pain   . Tape Other (See Comments)    Reaction:  Causes pts skin to tear  Pt states that she is able to use paper tape.      . Valdecoxib Swelling and Other (See Comments)    Pt states that her hands and feet swell.    . Iodine Rash  . Morphine And Related Rash    VITALS:  Blood pressure  (!) 127/50, pulse 69, temperature 98.3 F (36.8 C), temperature source Oral, resp. rate 20, height 5\' 4"  (1.626 m), weight 103.6 kg, SpO2 100 %.  PHYSICAL EXAMINATION:   Physical Exam  GENERAL:  77 y.o.-year-old patient lying in the bed with no acute distress.  EYES: Pupils equal, round, reactive to light and accommodation. No scleral icterus. Extraocular muscles intact.  HEENT: Head atraumatic, normocephalic. Oropharynx and nasopharynx clear.  NECK:  Supple, no jugular venous distention. No thyroid enlargement, no tenderness.  LUNGS: Normal breath sounds bilaterally, no wheezing, rales, rhonchi. No use of accessory muscles of respiration.  CARDIOVASCULAR: S1, S2 normal. No murmurs, rubs, or gallops.  ABDOMEN: Soft, epigastric and right upper quadrant tenderness nondistended. Bowel sounds present.  EXTREMITIES: No cyanosis, clubbing or edema b/l.    NEUROLOGIC: Cranial nerves II through XII are intact. No focal Motor or sensory deficits b/l.   PSYCHIATRIC:  patient is alert and oriented x 3.  SKIN: No obvious rash, lesion, or ulcer.   LABORATORY PANEL:  CBC Recent Labs  Lab 06/18/18 0407  WBC 3.5*  HGB 9.9*  HCT 29.9*  PLT 192    Chemistries  Recent Labs  Lab 06/18/18 0407  NA 136  K 4.6  CL 102  CO2 28  GLUCOSE 171*  BUN 14  CREATININE 1.27*  CALCIUM 8.5*  AST 39  ALT 19  ALKPHOS 135*  BILITOT 0.9   Cardiac Enzymes No results for input(s): TROPONINI in the last 168 hours. RADIOLOGY:  US Abdomen Limited Ruq  Result Date: 06/17/2018 CLINICAL DATA:  Abnormal liver function tests. History of cholecystectomy. EXAM: ULTRASOUND ABDOMEN LIMITED RIGHT UPPER QUADRANT COMPARISON:  06/14/2018 CT abdomen/pelvis. FINDINGS: Gallbladder: Surgically absent. Common bile duct: Diameter: 9 mm Liver: No focal lesion identified. Within normal limits in parenchymal echogenicity. Portal vein is patent on color Doppler imaging with normal direction of blood flow towards the liver.  IMPRESSION: 1. Bile duct are within normal post cholecystectomy limits with CBD diameter 9 mm. 2. Normal liver. Electronically Signed   By: Deborah Obrien M.D.   On: 06/17/2018 09:54   ASSESSMENT AND PLAN:  Deborah Obrien  is a 77 y.o. female with a known history of diabetes, hypertension, recent upper GI bleed with gastric polyp, anxiety, recent kyphoplasty presents to the emergency room with 1 week of worsening epigastric and right upper quadrant pain.  The patient went to her GI physician Dr. Georgeann Oppenheim office who referred her to the emergency room.  A CT scan of the abdomen and pelvis has been checked and is normal.  Urinalysis shows UTI.  **Epigastric abdominal pain, right upper quadrant abdominal pain with nausea probably from large gastric polyp Hydrate with IV fluids EGD scheduled for Tuesday, discussed with Dr. Vicente Males Appreciate GI recommendations  CT scan of the abdomen and pelvis is normal.   Right upper quadrant ultrasound with dilated common bile duct and 9 mm.  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  If patient can tolerate soft diet and is pain-free GI is considering outpatient EUS to evaluate the enlarged enlarged common bile duct, if patient is consistently symptomatic with abdominal pain and elevated LFTs she might need EUS sooner to rule out stones in the common bile duct and might need ERCP if CBD stones are present Patient does not feel comfortable to get transferred for inpatient EUS at this time   Continue twice daily PPI. -Patient had endoscopy done on April 12, 2018 which showed gastric polyp that was actively bleeding hemostasis was achieved. Patient feels her gastric polyp is causing pain. She wants G.I. to see her.   *UTI.   Urine culture with multiple species.  Antibiotics discontinued   *Constipation Stool softeners  Had a decent bowel movement after enema MiraLAX dose increased to 34 g.  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  Start IV fluids.  Monitor input and output.  Repeat labs in the morning. -. Baseline creatinine 1.5-- 1.7- -Creatinine 1.91-1.86-1.27 Gentle hydration with IV fluids  *chronic low back pain-Patient has palpable pain from her right back coming to the right upper quadrant appears more musculoskeletal strain from her recent fall and vertebral fracture however cannot rule out G.I. issue as well.  *Diabetes mellitus.  Continue home insulin at lower dose.  Sliding scale insulin added  *Paroxysmal atrial fibrillation.  Continue medications and Eliquis on hold for anticipated EGD on Tuesday.  N.p.o. after midnight  Case discussed with Care Management/Social Worker. Management plans discussed with the patient, husband at bedside and they are in agreement.  RN Eliezer Lofts and kim were  present during the conversation  CODE STATUS: full  DVT Prophylaxis: lovenox  TOTAL TIME TAKING CARE OF THIS PATIENT: 33 minutes.  >50% time spent on counselling and coordination of care  POSSIBLE D/C IN 1-2 DAYS, DEPENDING ON CLINICAL CONDITION.  Note: This dictation was prepared with Dragon dictation along with smaller phrase technology. Any transcriptional errors that result from this process are unintentional.  Nicholes Mango M.D on 06/18/2018 at 5:24 PM  Between 7am to 6pm - Pager - [phone]  After 6pm go to [email or web address] - password [email or web address] [facility]  Office  [phone]  CC: Primary care physician; [name], MDPatient ID: Deborah Obrien, female   DOB: 20-Sep-1940, 77 y.o.   MRN: [id number]

## 2018-06-18 NOTE — Care Management Important Message (Signed)
Copy of signed IM left with patient in room.  

## 2018-06-18 NOTE — Progress Notes (Signed)
Deborah Antigua, MD [address], [place], [place], [place], [address] [address], [place], [place], [place], [address] Phone: [phone]  Fax: [phone]   Subjective:  Patient sitting very comfortably in bed.  However, reports intermittent abdominal pain.  No nausea or vomiting.  Objective: Exam: Vital signs in last 24 hours: Vitals:   06/17/18 1156 06/17/18 2016 06/18/18 0317 06/18/18 0705  BP: (!) 123/43 (!) 107/44 101/60   Pulse: 62 66 62 67  Resp: 14 (!) 22 20 20   Temp: 98.4 F (36.9 C) 98.6 F (37 C) 97.8 F (36.6 C)   TempSrc: Oral Oral Oral   SpO2: 100% 100% 100% 100%  Weight:   103.6 kg   Height:       Weight change: -2.496 kg  Intake/Output Summary (Last 24 hours) at 06/18/2018 1314 Last data filed at 06/18/2018 0947 Gross per 24 hour  Intake 360 ml  Output 1100 ml  Net -740 ml    General: No acute distress, AAO x3 Abd: Soft, NT/ND, No HSM Skin: Warm, no rashes Neck: Supple, Trachea midline   Lab Results: Lab Results  Component Value Date   WBC 3.5 (L) 06/18/2018   HGB 9.9 (L) 06/18/2018   HCT 29.9 (L) 06/18/2018   MCV 93.7 06/18/2018   PLT 192 06/18/2018   Micro Results: Recent Results (from the past 240 hour(s))  Urine Culture     Status: Abnormal   Collection Time: 06/16/18 12:31 PM  Result Value Ref Range Status   Specimen Description   Final    URINE, RANDOM Performed at [facility], [address] [place], [place] [address]    Special Requests   Final    cipro Normal Performed at [facility], [address] [place], [place] [address]    Culture MULTIPLE SPECIES PRESENT, SUGGEST RECOLLECTION (A)  Final   Report Status 06/18/2018 FINAL  Final   Studies/Results: US Abdomen Limited Ruq  Result Date: 06/17/2018 CLINICAL DATA:  Abnormal liver function tests. History of cholecystectomy. EXAM: ULTRASOUND ABDOMEN LIMITED RIGHT UPPER QUADRANT COMPARISON:  06/14/2018 CT abdomen/pelvis. FINDINGS:  Gallbladder: Surgically absent. Common bile duct: Diameter: 9 mm Liver: No focal lesion identified. Within normal limits in parenchymal echogenicity. Portal vein is patent on color Doppler imaging with normal direction of blood flow towards the liver. IMPRESSION: 1. Bile duct are within normal post cholecystectomy limits with CBD diameter 9 mm. 2. Normal liver. Electronically Signed   By: Ilona Sorrel M.D.   On: 06/17/2018 09:54   Medications:  Scheduled Meds: . allopurinol  150 mg Oral Daily  . amiodarone  200 mg Oral BID  . bisacodyl  10 mg Rectal Daily  . busPIRone  10 mg Oral BID  . ciprofloxacin  250 mg Oral BID  . diphenhydrAMINE  25 mg Oral QHS  . docusate sodium  100 mg Oral QHS  . insulin aspart  0-5 Units Subcutaneous QHS  . insulin aspart  0-9 Units Subcutaneous TID WC  . ipratropium-albuterol  3 mL Nebulization BID  . magnesium citrate  1 Bottle Oral Once  . nystatin   Topical TID  . oxybutynin  2.5 mg Oral QHS  . pantoprazole  40 mg Oral BID  . PARoxetine  40 mg Oral BH-q7a  . polyethylene glycol  17 g Oral Daily  . pravastatin  20 mg Oral QHS  . senna-docusate  1 tablet Oral q morning - 10a  . sucralfate  1 g Oral QID   Continuous Infusions:  PRN Meds:.acetaminophen **OR** acetaminophen, albuterol, colchicine, HYDROcodone-acetaminophen, morphine injection, ondansetron **OR** ondansetron (ZOFRAN) IV, phenol, polyethylene glycol   Assessment: 77 year old female, patient of Dr. Vicente Males, with abdominal pain, history of gastric polyp and bleeding from gastric polyp site, history of cholecystectomy, history of CBD dilation   Plan: Patient's bilirubin is completely normal today.  Alk phos is only mildly elevated.  Transaminases are normal  Ultrasound shows bile ducts to be within normal limits as they are the expected to be postcholecystectomy status  Normal liver enzymes, and above ultrasound findings point to no CBD obstruction at this time Repeat CMP tomorrow  If liver  enzymes remain normal, and abdominal pain improves patient can have an EUS as an outpatient  If abdominal pain worsens, or liver enzymes worsen, consider transfer to tertiary cancer center for evaluation of EUS. The [facility] EUS staff are not coming to [place] for their elective outpatient cases this week.   Her abdominal pain may also be related to her constipation She is on Colace, senna, MiraLAX but does not report regular bowel movements in the hospital  Would recommend increasing MiraLAX to 34 g in the morning instead of 17 g in the morning Discontinue Senna and Colace Give dulcolax 68m PO X 1 now  CT abdomen shows some stool burden  If no BMs with above measures, give 250cc warm tap water enema today or tomorrow  As far as her EGD for gastric polyp removal is concerned. I discussed it with Dr. AVicente Malesand her gastric polyps are not the cause of her pain. Therefore, he is recommeding EGD can be done by Dr. AVicente Malesas an inpatient or outpatient electively after abdominal pain improves.     LOS: 4 days   [name] 06/18/2018, 1:14 PM

## 2018-06-19 ENCOUNTER — Telehealth: Payer: Self-pay | Admitting: Gastroenterology

## 2018-06-19 ENCOUNTER — Encounter: Payer: Self-pay | Admitting: Radiology

## 2018-06-19 ENCOUNTER — Inpatient Hospital Stay: Payer: Medicare Other

## 2018-06-19 LAB — GLUCOSE, CAPILLARY
GLUCOSE-CAPILLARY: 184 mg/dL — AB (ref 70–99)
GLUCOSE-CAPILLARY: 204 mg/dL — AB (ref 70–99)
Glucose-Capillary: 192 mg/dL — ABNORMAL HIGH (ref 70–99)
Glucose-Capillary: 161 mg/dL — ABNORMAL HIGH (ref 70–99)
Glucose-Capillary: 189 mg/dL — ABNORMAL HIGH (ref 70–99)

## 2018-06-19 MED ORDER — TECHNETIUM TC 99M MEBROFENIN IV KIT
5.0000 | PACK | Freq: Once | INTRAVENOUS | Status: AC | PRN
  Administered 2018-06-19: 5.58 via INTRAVENOUS

## 2018-06-19 NOTE — Progress Notes (Addendum)
[place] [facility] [place] NAME: Deborah Obrien    MR#:  [id number]  DATE OF BIRTH:  Mar 03, 1941  SUBJECTIVE:  patient came in with epigastric abdominal pain.  Still reporting epigastric abdominal pain nausea and right upper quadrant abdominal pain 9 out of 10.  Gets worse after she eats , agreeable to go to [facility] for EUS as HIDAnml husband at bedside.  Constipated.  she recently had vertebral fracture status post-kyphoplasty.   REVIEW OF SYSTEMS:   Review of Systems  Constitutional: Negative for chills, fever and weight loss.  HENT: Negative for ear discharge, ear pain and nosebleeds.   Eyes: Negative for blurred vision, pain and discharge.  Respiratory: Negative for sputum production, shortness of breath, wheezing and stridor.   Cardiovascular: Negative for chest pain, palpitations, orthopnea and PND.  Gastrointestinal: Positive for abdominal pain. Negative for diarrhea, nausea and vomiting.  Genitourinary: Negative for frequency and urgency.  Musculoskeletal: Positive for back pain. Negative for joint pain.  Neurological: Negative for sensory change, speech change, focal weakness and weakness.  Psychiatric/Behavioral: Negative for depression and hallucinations. The patient is not nervous/anxious.    Tolerating Diet:yes Tolerating PT: pending  DRUG ALLERGIES:   Allergies  Allergen Reactions  . Cephalosporins Other (See Comments)    Reaction:  Unknown   . Fish Allergy Shortness Of Breath  . Macrolides And Ketolides Other (See Comments)    Reaction:  Unknown  Other Reaction: Intolerance Other Reaction: Intolerance   . Meperidine Shortness Of Breath, Nausea Only and Other (See Comments)    Reaction:  Stomach pain   . Other Nausea Only, Rash and Other (See Comments)    Uncoded Allergy. Allergen: NON-STEROIDS, Other Reaction: Not Assessed bextra - hands and feet swell Shellfish - SOB Shellfish - SOB Other Reaction: Not  Assessed  . Prednisone Anaphylaxis and Other (See Comments)    Reaction:  Increases pts blood sugar  Pt states that she is allergic to all steroids.    . Shellfish Allergy Anaphylaxis, Shortness Of Breath, Diarrhea, Nausea Only and Rash    Reaction:  Stomach pain  Other reaction(s): Other (See Comments) Reaction:Stomach pain   . Sulfa Antibiotics Shortness Of Breath, Diarrhea, Nausea Only and Other (See Comments)    Reaction:  Stomach pain  Reaction:Stomach pain  Other Reaction: Intolerance   . Sulfacetamide Sodium Diarrhea, Nausea Only, Other (See Comments) and Shortness Of Breath    Reaction:  Stomach pain   . Telbivudine     Other reaction(s): Other (See Comments) Reaction:Unknown   . Uloric [Febuxostat] Anaphylaxis    Locks pt's body up   . Aspirin Other (See Comments)    Reaction:  Stomach pain   . Celecoxib Other (See Comments)    Reaction:  GI bleed, weakness, and stomach pain.    . Cephalexin Diarrhea, Nausea Only and Other (See Comments)    Reaction:  Stomach pain   . Erythromycin Diarrhea, Nausea Only and Other (See Comments)    Reaction:  Stomach pain   . Hydromorphone Other (See Comments)    Reaction:  Hypotension   . Iodinated Diagnostic Agents Rash and Other (See Comments)    Pt states that she is unable to have because she has chronic kidney disease.   Pt states that she is unable to have because she has chronic kidney disease. CKD CKD  . Oxycodone Other (See Comments)    Reaction:  Stomach pain   . Atorvastatin Nausea Only and Other (  See Comments)    Reaction:  Weakness   . Codeine Diarrhea, Nausea Only and Other (See Comments)    Reaction:  Stomach pain Pt tolerates morphine   . Doxycycline     Stomach pain   . Tape Other (See Comments)    Reaction:  Causes pts skin to tear  Pt states that she is able to use paper tape.      . Valdecoxib Swelling and Other (See Comments)    Pt states that her hands and feet swell.    . Iodine Rash  .  Morphine And Related Rash    VITALS:  Blood pressure (!) 131/50, pulse 67, temperature 98.6 F (37 C), temperature source Oral, resp. rate 16, height 5\' 4"  (1.626 m), weight 104.8 kg, SpO2 96 %.  PHYSICAL EXAMINATION:   Physical Exam  GENERAL:  77 y.o.-year-old patient lying in the bed with no acute distress.  EYES: Pupils equal, round, reactive to light and accommodation. No scleral icterus. Extraocular muscles intact.  HEENT: Head atraumatic, normocephalic. Oropharynx and nasopharynx clear.  NECK:  Supple, no jugular venous distention. No thyroid enlargement, no tenderness.  LUNGS: Normal breath sounds bilaterally, no wheezing, rales, rhonchi. No use of accessory muscles of respiration.  CARDIOVASCULAR: S1, S2 normal. No murmurs, rubs, or gallops.  ABDOMEN: Soft, epigastric and right upper quadrant tenderness nondistended. Bowel sounds present.  EXTREMITIES: No cyanosis, clubbing or edema b/l.    NEUROLOGIC: Cranial nerves II through XII are intact. No focal Motor or sensory deficits b/l.   PSYCHIATRIC:  patient is alert and oriented x 3.  SKIN: No obvious rash, lesion, or ulcer.   LABORATORY PANEL:  CBC Recent Labs  Lab 06/18/18 0407  WBC 3.5*  HGB 9.9*  HCT 29.9*  PLT 192    Chemistries  Recent Labs  Lab 06/18/18 0407  NA 136  K 4.6  CL 102  CO2 28  GLUCOSE 171*  BUN 14  CREATININE 1.27*  CALCIUM 8.5*  AST 39  ALT 19  ALKPHOS 135*  BILITOT 0.9   Cardiac Enzymes No results for input(s): TROPONINI in the last 168 hours. RADIOLOGY:  Nm Hepatobiliary Liver Func  Result Date: 06/19/2018 CLINICAL DATA:  Right upper quadrant pain with nausea and vomiting EXAM: NUCLEAR MEDICINE HEPATOBILIARY IMAGING TECHNIQUE: Sequential images of the abdomen were obtained out to 60 minutes following intravenous administration of radiopharmaceutical. RADIOPHARMACEUTICALS:  5.58 mCi Tc-25m  Choletec IV COMPARISON:  June 17, 2018 ultrasound right upper quadrant FINDINGS: Liver  uptake of radiotracer is normal. There is prompt visualization of small bowel, indicating patency of the common bile duct. Gallbladder is noted to be absent. No ectopic radiotracer. IMPRESSION: There is patency of the common bile duct, indicated by prompt visualization of small bowel. Gallbladder absent. No biliary leak evident. Electronically Signed   By: Lowella Grip III M.D.   On: 06/19/2018 13:36   ASSESSMENT AND PLAN:  Jerzey Komperda  is a 77 y.o. female with a known history of diabetes, hypertension, recent upper GI bleed with gastric polyp, anxiety, recent kyphoplasty presents to the emergency room with 1 week of worsening epigastric and right upper quadrant pain.  The patient went to her GI physician Dr. Georgeann Oppenheim office who referred her to the emergency room.  A CT scan of the abdomen and pelvis has been checked and is normal.  Urinalysis shows UTI.  **Epigastric abdominal pain, right upper quadrant abdominal pain with nausea probably from large gastric polyp Hydrate with IV fluids EGD canceled by  GI and gastric polyp might not be the etiology of her right upper quadrant pain HIDA scan is normal Patient is agreeable to get transferred to [facility].  Discussed with Zacarias Pontes, GI Dr. Tarri Glenn regarding inpatient EUS They do not have any slots available until November 18 Discussed with Dr. Vicente Males , patient's primary gastroenterologist recommending transfer to other tertiary care centers like [facility] or [facility] if patient is agreeable  appreciate GI recommendations  CT scan of the abdomen and pelvis is normal.   Right upper quadrant ultrasound with dilated common bile duct and 9 mm.  MRCP could not done as the patient has pacemaker.  Continue twice daily PPI. -Patient had endoscopy done on April 12, 2018 which showed gastric polyp that was actively bleeding hemostasis was achieved. Patient feels her gastric polyp is causing pain. She wants G.I. to see her. Eliquis is on hold for possible EGD  which can be resumed if GI is okay   *UTI.   Urine culture with multiple species.  Antibiotics discontinued   *Constipation Stool softeners  Had a decent bowel movement after enema MiraLAX dose increased to 34 g.  Colace and Senokot discontinued  *Acute kidney injury over CKD stage III.  Start IV fluids.  Monitor input and output.  Repeat labs in the morning. -. Baseline creatinine 1.5-- 1.7- -Creatinine 1.91-1.86-1.27 Gentle hydration with IV fluids  *chronic low back pain-Patient has palpable pain from her right back coming to the right upper quadrant appears more musculoskeletal strain from her recent fall and vertebral fracture however cannot rule out G.I. issue as well.  *Diabetes mellitus.  Continue home insulin at lower dose.  Sliding scale insulin added  *Paroxysmal atrial fibrillation.    Resume Eliquis if GI is okay  Case discussed with Care Management/Social Worker. Management plans discussed with the patient, husband at bedside and they are in agreement.  RN Eliezer Lofts and kim were  present during the conversation  CODE STATUS: full  DVT Prophylaxis: lovenox  TOTAL TIME TAKING CARE OF THIS PATIENT: 33 minutes.  >50% time spent on counselling and coordination of care  POSSIBLE D/C IN 1-2 DAYS, DEPENDING ON CLINICAL CONDITION.  Note: This dictation was prepared with Dragon dictation along with smaller phrase technology. Any transcriptional errors that result from this process are unintentional.  Deborah Obrien M.D on 06/19/2018 at 7:14 PM  Between 7am to 6pm - Pager - [phone]  After 6pm go to [email or web address] - password [email or web address] [facility]  Office  [phone]  CC: Primary care physician; [name], MDPatient ID: Deborah Obrien, female   DOB: [birth year], 77 y.o.   MRN: [id number]

## 2018-06-19 NOTE — Telephone Encounter (Signed)
[name] Bilodeau's (spouse)called stating patient is still an inpatient at [facility] rm[facility][id number]. She is still in a lot of pain. Please advise. Please call [phone].

## 2018-06-19 NOTE — Telephone Encounter (Signed)
Spoke with Deborah Obrien regarding concern for pt condition. He states they were able to consult with Dr. Vicente Males.

## 2018-06-20 LAB — CUP PACEART REMOTE DEVICE CHECK
Battery Voltage: 3.04 V
Brady Statistic AP VP Percent: 0.02 %
Brady Statistic AP VS Percent: 97.99 %
Brady Statistic AS VP Percent: 0 %
Brady Statistic RV Percent Paced: 0.07 %
Date Time Interrogation Session: 20190917043921
Implantable Lead Location: 753859
Implantable Lead Model: 5076
Lead Channel Impedance Value: 437 Ohm
Lead Channel Sensing Intrinsic Amplitude: 6.375 mV
Lead Channel Sensing Intrinsic Amplitude: 6.375 mV
Lead Channel Sensing Intrinsic Amplitude: 6.875 mV
Lead Channel Setting Pacing Amplitude: 2.5 V
Lead Channel Setting Pacing Pulse Width: 0.4 ms
MDC IDC LEAD IMPLANT DT: 20161019
MDC IDC LEAD IMPLANT DT: 20181217
MDC IDC LEAD LOCATION: 753860
MDC IDC MSMT BATTERY REMAINING LONGEVITY: 151 mo
MDC IDC MSMT LEADCHNL RA IMPEDANCE VALUE: 342 Ohm
MDC IDC MSMT LEADCHNL RA IMPEDANCE VALUE: 399 Ohm
MDC IDC MSMT LEADCHNL RA PACING THRESHOLD AMPLITUDE: 0.875 V
MDC IDC MSMT LEADCHNL RA PACING THRESHOLD PULSEWIDTH: 0.4 ms
MDC IDC MSMT LEADCHNL RA SENSING INTR AMPL: 6.875 mV
MDC IDC MSMT LEADCHNL RV IMPEDANCE VALUE: 380 Ohm
MDC IDC MSMT LEADCHNL RV PACING THRESHOLD AMPLITUDE: 0.625 V
MDC IDC MSMT LEADCHNL RV PACING THRESHOLD PULSEWIDTH: 0.4 ms
MDC IDC PG IMPLANT DT: 20181217
MDC IDC SET LEADCHNL RA PACING AMPLITUDE: 2 V
MDC IDC SET LEADCHNL RV SENSING SENSITIVITY: 1.2 mV
MDC IDC STAT BRADY AS VS PERCENT: 2 %
MDC IDC STAT BRADY RA PERCENT PACED: 97.53 %

## 2018-06-20 LAB — GLUCOSE, CAPILLARY
GLUCOSE-CAPILLARY: 172 mg/dL — AB (ref 70–99)
GLUCOSE-CAPILLARY: 229 mg/dL — AB (ref 70–99)
Glucose-Capillary: 170 mg/dL — ABNORMAL HIGH (ref 70–99)
Glucose-Capillary: 211 mg/dL — ABNORMAL HIGH (ref 70–99)
Glucose-Capillary: 175 mg/dL — ABNORMAL HIGH (ref 70–99)
Glucose-Capillary: 189 mg/dL — ABNORMAL HIGH (ref 70–99)

## 2018-06-20 LAB — COMPREHENSIVE METABOLIC PANEL
ALBUMIN: 3.1 g/dL — AB (ref 3.5–5.0)
ALK PHOS: 164 U/L — AB (ref 38–126)
ALT: 20 U/L (ref 0–44)
AST: 33 U/L (ref 15–41)
Anion gap: 8 (ref 5–15)
BILIRUBIN TOTAL: 1.1 mg/dL (ref 0.3–1.2)
BUN: 9 mg/dL (ref 8–23)
CALCIUM: 8.7 mg/dL — AB (ref 8.9–10.3)
CO2: 28 mmol/L (ref 22–32)
CREATININE: 1.15 mg/dL — AB (ref 0.44–1.00)
Chloride: 102 mmol/L (ref 98–111)
GFR calc Af Amer: 52 mL/min — ABNORMAL LOW (ref >60)
GFR calc non Af Amer: 45 mL/min — ABNORMAL LOW (ref >60)
Glucose, Bld: 176 mg/dL — ABNORMAL HIGH (ref 70–99)
Potassium: 4.3 mmol/L (ref 3.5–5.1)
SODIUM: 138 mmol/L (ref 135–145)
Total Protein: 6.3 g/dL — ABNORMAL LOW (ref 6.5–8.1)

## 2018-06-20 LAB — CBC
HCT: 32 % — ABNORMAL LOW (ref 36.0–46.0)
Hemoglobin: 10.3 g/dL — ABNORMAL LOW (ref 12.0–15.0)
MCH: 30.1 pg (ref 26.0–34.0)
MCHC: 32.2 g/dL (ref 30.0–36.0)
MCV: 93.6 fL (ref 80.0–100.0)
PLATELETS: 216 10*3/uL (ref 150–400)
RBC: 3.42 MIL/uL — ABNORMAL LOW (ref 3.87–5.11)
RDW: 13.9 % (ref 11.5–15.5)
WBC: 3.5 10*3/uL — AB (ref 4.0–10.5)
nRBC: 0 % (ref 0.0–0.2)

## 2018-06-20 MED ORDER — APIXABAN 5 MG PO TABS
5.0000 mg | ORAL_TABLET | Freq: Two times a day (BID) | ORAL | Status: DC
  Administered 2018-06-20 – 2018-06-21 (×2): 5 mg via ORAL
  Filled 2018-06-20 (×2): qty 1

## 2018-06-20 NOTE — Care Management Important Message (Signed)
Copy of signed IM left with patient in room.  

## 2018-06-20 NOTE — Progress Notes (Signed)
[facility] [place] NAME: Deborah Obrien    MR#:  [id number]  DATE OF BIRTH:  June 19, 1941  SUBJECTIVE:  Patient seen and evaluated today Has abdominal pain Abdominal pain gets worse on eating Unable to eat because of nausea and vomiting and abdominal discomfort Endoscopic ultrasound plan was discussed with gastroenterology at [facility] but did not have a date until November 18 We also tried to transfer patient to [facility] after discussing with the gastroenterologist there who recommended no endoscopic ultrasound at this moment considering normal imaging study, and liver function tests Case was discussed with our gastroenterologist Dr. Vicente Males   REVIEW OF SYSTEMS:   Review of Systems  Constitutional: Negative for chills, fever and weight loss.  HENT: Negative for ear discharge, ear pain and nosebleeds.   Eyes: Negative for blurred vision, pain and discharge.  Respiratory: Negative for sputum production, shortness of breath, wheezing and stridor.   Cardiovascular: Negative for chest pain, palpitations, orthopnea and PND.  Gastrointestinal: Positive for abdominal pain. Negative for diarrhea, nausea and vomiting.  Genitourinary: Negative for frequency and urgency.  Musculoskeletal: Positive for back pain. Negative for joint pain.  Neurological: Negative for sensory change, speech change, focal weakness and weakness.  Psychiatric/Behavioral: Negative for depression and hallucinations. The patient is not nervous/anxious.    Tolerating Diet:yes Tolerating PT: pending  DRUG ALLERGIES:   Allergies  Allergen Reactions  . Cephalosporins Other (See Comments)    Reaction:  Unknown   . Fish Allergy Shortness Of Breath  . Macrolides And Ketolides Other (See Comments)    Reaction:  Unknown  Other Reaction: Intolerance Other Reaction: Intolerance   . Meperidine Shortness Of Breath, Nausea Only and Other (See Comments)    Reaction:  Stomach pain   . Other Nausea Only, Rash and Other (See Comments)    Uncoded Allergy. Allergen: NON-STEROIDS, Other Reaction: Not Assessed bextra - hands and feet swell Shellfish - SOB Shellfish - SOB Other Reaction: Not Assessed  . Prednisone Anaphylaxis and Other (See Comments)    Reaction:  Increases pts blood sugar  Pt states that she is allergic to all steroids.    . Shellfish Allergy Anaphylaxis, Shortness Of Breath, Diarrhea, Nausea Only and Rash    Reaction:  Stomach pain  Other reaction(s): Other (See Comments) Reaction:Stomach pain   . Sulfa Antibiotics Shortness Of Breath, Diarrhea, Nausea Only and Other (See Comments)    Reaction:  Stomach pain  Reaction:Stomach pain  Other Reaction: Intolerance   . Sulfacetamide Sodium Diarrhea, Nausea Only, Other (See Comments) and Shortness Of Breath    Reaction:  Stomach pain   . Telbivudine     Other reaction(s): Other (See Comments) Reaction:Unknown   . Uloric [Febuxostat] Anaphylaxis    Locks pt's body up   . Aspirin Other (See Comments)    Reaction:  Stomach pain   . Celecoxib Other (See Comments)    Reaction:  GI bleed, weakness, and stomach pain.    . Cephalexin Diarrhea, Nausea Only and Other (See Comments)    Reaction:  Stomach pain   . Erythromycin Diarrhea, Nausea Only and Other (See Comments)    Reaction:  Stomach pain   . Hydromorphone Other (See Comments)    Reaction:  Hypotension   . Iodinated Diagnostic Agents Rash and Other (See Comments)    Pt states that she is unable to have because she has chronic kidney disease.   Pt states that she is unable to  have because she has chronic kidney disease. CKD CKD  . Oxycodone Other (See Comments)    Reaction:  Stomach pain   . Atorvastatin Nausea Only and Other (See Comments)    Reaction:  Weakness   . Codeine Diarrhea, Nausea Only and Other (See Comments)    Reaction:  Stomach pain Pt tolerates morphine   . Doxycycline     Stomach pain   .  Tape Other (See Comments)    Reaction:  Causes pts skin to tear  Pt states that she is able to use paper tape.      . Valdecoxib Swelling and Other (See Comments)    Pt states that her hands and feet swell.    . Iodine Rash  . Morphine And Related Rash    VITALS:  Blood pressure (!) 144/67, pulse 71, temperature 98.2 F (36.8 C), temperature source Oral, resp. rate 19, height 5\' 4"  (1.626 m), weight 104.8 kg, SpO2 96 %.  PHYSICAL EXAMINATION:   Physical Exam  GENERAL:  77 y.o.-year-old patient lying in the bed with no acute distress.  EYES: Pupils equal, round, reactive to light and accommodation. No scleral icterus. Extraocular muscles intact.  HEENT: Head atraumatic, normocephalic. Oropharynx and nasopharynx clear.  NECK:  Supple, no jugular venous distention. No thyroid enlargement, no tenderness.  LUNGS: Normal breath sounds bilaterally, no wheezing, rales, rhonchi. No use of accessory muscles of respiration.  CARDIOVASCULAR: S1, S2 normal. No murmurs, rubs, or gallops.  ABDOMEN: Soft, epigastric and right upper quadrant tenderness nondistended. Bowel sounds present.  EXTREMITIES: No cyanosis, clubbing or edema b/l.    NEUROLOGIC: Cranial nerves II through XII are intact. No focal Motor or sensory deficits b/l.   PSYCHIATRIC:  patient is alert and oriented x 3.  SKIN: No obvious rash, lesion, or ulcer.   LABORATORY PANEL:  CBC Recent Labs  Lab 06/20/18 0333  WBC 3.5*  HGB 10.3*  HCT 32.0*  PLT 216    Chemistries  Recent Labs  Lab 06/20/18 0333  NA 138  K 4.3  CL 102  CO2 28  GLUCOSE 176*  BUN 9  CREATININE 1.15*  CALCIUM 8.7*  AST 33  ALT 20  ALKPHOS 164*  BILITOT 1.1   Cardiac Enzymes No results for input(s): TROPONINI in the last 168 hours. RADIOLOGY:  Nm Hepatobiliary Liver Func  Result Date: 06/19/2018 CLINICAL DATA:  Right upper quadrant pain with nausea and vomiting EXAM: NUCLEAR MEDICINE HEPATOBILIARY IMAGING TECHNIQUE: Sequential images of  the abdomen were obtained out to 60 minutes following intravenous administration of radiopharmaceutical. RADIOPHARMACEUTICALS:  5.58 mCi Tc-17m  Choletec IV COMPARISON:  June 17, 2018 ultrasound right upper quadrant FINDINGS: Liver uptake of radiotracer is normal. There is prompt visualization of small bowel, indicating patency of the common bile duct. Gallbladder is noted to be absent. No ectopic radiotracer. IMPRESSION: There is patency of the common bile duct, indicated by prompt visualization of small bowel. Gallbladder absent. No biliary leak evident. Electronically Signed   By: Lowella Grip III M.D.   On: 06/19/2018 13:36   ASSESSMENT AND PLAN:  Kevia Zaucha  is a 77 y.o. female with a known history of diabetes, hypertension, recent upper GI bleed with gastric polyp, anxiety, recent kyphoplasty presents to the emergency room with 1 week of worsening epigastric and right upper quadrant pain.  The patient went to her GI physician Dr. Georgeann Oppenheim office who referred her to the emergency room.  A CT scan of the abdomen and pelvis has been checked  and is normal.   -Abdominal pain with nausea and vomiting Hydrate with IV fluids Case was discussed with [facility] for transfer for endoscopic ultrasound Patient has not been accepted and according to the gastroenterologist at [facility] endoscopic ultrasound is not needed at this moment considering normal imaging studies and liver function test Will follow-up with gastroenterology at our hospital for EGD HIDA scan is normal Case was also discussed with gastroenterologist at [facility] who gave the date for endoscopic ultrasound on July 30, 2018  -Acute cystitis  Urine culture with multiple species.  Antibiotics discontinued  -Constipation Stool softeners   -Acute kidney injury over CKD stage III.  Improved with IV fluids Monitor renal function  -chronic low back pain-Patient has palpable pain from her right back  coming to the right upper quadrant appears more musculoskeletal strain from her recent fall and vertebral fracture however cannot rule out G.I. issue as well.  -Diabetes mellitus.  Continue home insulin at lower dose.  Sliding scale insulin added  -Paroxysmal atrial fibrillation.  Discussed with gastroenterology will resume Eliquis for anticoagulation  Case discussed with Care Management/Social Worker. Management plans discussed with the patient, husband at bedside and they are in agreement.  RN Eliezer Lofts and kim were  present during the conversation  CODE STATUS: full  DVT Prophylaxis: lovenox  TOTAL TIME TAKING CARE OF THIS PATIENT: 32 minutes.  >50% time spent on counselling and coordination of care  POSSIBLE D/C IN 1-2 DAYS, DEPENDING ON CLINICAL CONDITION.  Note: This dictation was prepared with Dragon dictation along with smaller phrase technology. Any transcriptional errors that result from this process are unintentional.  Saundra Shelling M.D on 06/20/2018 at 3:39 PM  Between 7am to 6pm - Pager - [phone]  After 6pm go to [email or web address] - password [email or web address] [facility]  Office  [phone]  CC: Primary care physician; [name], MDPatient ID: Deborah Obrien, female   DOB: [birth year], 77 y.o.   MRN: [id number]

## 2018-06-20 NOTE — Progress Notes (Signed)
Inpatient Diabetes Program Recommendations  AACE/ADA: New Consensus Statement on Inpatient Glycemic Control (2015)  Target Ranges:  Prepandial:   less than 140 mg/dL      Peak postprandial:   less than 180 mg/dL (1-2 hours)      Critically ill patients:  140 - 180 mg/dL   Lab Results  Component Value Date   GLUCAP 229 (H) 06/20/2018   HGBA1C 6.7 (H) 06/14/2018    Review of Glycemic ControlResults for Deborah Obrien, Deborah Obrien (MRN [id number]) as of 06/20/2018 14:41  Ref. Range 06/19/2018 17:06 06/19/2018 21:25 06/20/2018 04:50 06/20/2018 07:34 06/20/2018 11:38  Glucose-Capillary Latest Ref Range: 70 - 99 mg/dL 204 (H) 189 (H) 172 (H) 170 (H) 229 (H)    Diabetes history: Type 2 DM Outpatient Diabetes medications: Novolin 70/30 22 units bid Current orders for Inpatient glycemic control:  Novolog sensitive tid with meals and HS Inpatient Diabetes Program Recommendations:   Please consider adding Lantus 10 units daily.    Adah Perl, RN, BC-ADM Inpatient Diabetes Coordinator Pager [phone] (8a-5p)

## 2018-06-20 NOTE — Progress Notes (Signed)
Deborah Antigua, MD [address] [place], [place], [place], [address] [address], [place], [place], [place], [address] Phone: [phone]  Fax: [phone]   Subjective: Patient sitting up in bed comfortably.  Able to have a conversation with me comfortably without any distress.  Has a grilled cheese sandwich in front of her and she has taken a few bites of it.  However, continues to complain of abdominal pain.   Objective: Exam: Vital signs in last 24 hours: Vitals:   06/19/18 2022 06/20/18 0453 06/20/18 0723 06/20/18 1204  BP:  (!) 119/100  (!) 144/67  Pulse:  76 69 71  Resp:  18 18 19   Temp:  98 F (36.7 C)  98.2 F (36.8 C)  TempSrc:  Oral  Oral  SpO2: 96% 98% 95% 96%  Weight:      Height:       Weight change:   Intake/Output Summary (Last 24 hours) at 06/20/2018 1525 Last data filed at 06/20/2018 1300 Gross per 24 hour  Intake 480 ml  Output 300 ml  Net 180 ml    General: No acute distress, AAO x3 Abd: Soft, NT/ND, No HSM Skin: Warm, no rashes Neck: Supple, Trachea midline   Lab Results: Lab Results  Component Value Date   WBC 3.5 (L) 06/20/2018   HGB 10.3 (L) 06/20/2018   HCT 32.0 (L) 06/20/2018   MCV 93.6 06/20/2018   PLT 216 06/20/2018   Micro Results: Recent Results (from the past 240 hour(s))  Urine Culture     Status: Abnormal   Collection Time: 06/16/18 12:31 PM  Result Value Ref Range Status   Specimen Description   Final    URINE, RANDOM Performed at [facility], [address], [place], [place] [address]    Special Requests   Final    cipro Normal Performed at [facility], [address], [place], [place] [address]    Culture MULTIPLE SPECIES PRESENT, SUGGEST RECOLLECTION (A)  Final   Report Status 06/18/2018 FINAL  Final   Studies/Results: Nm Hepatobiliary Liver Func  Result Date: 06/19/2018 CLINICAL DATA:  Right upper quadrant pain with nausea and vomiting EXAM: NUCLEAR MEDICINE HEPATOBILIARY  IMAGING TECHNIQUE: Sequential images of the abdomen were obtained out to 60 minutes following intravenous administration of radiopharmaceutical. RADIOPHARMACEUTICALS:  5.58 mCi Tc-39m  Choletec IV COMPARISON:  June 17, 2018 ultrasound right upper quadrant FINDINGS: Liver uptake of radiotracer is normal. There is prompt visualization of small bowel, indicating patency of the common bile duct. Gallbladder is noted to be absent. No ectopic radiotracer. IMPRESSION: There is patency of the common bile duct, indicated by prompt visualization of small bowel. Gallbladder absent. No biliary leak evident. Electronically Signed   By: Lowella Grip III M.D.   On: 06/19/2018 13:36   Medications:  Scheduled Meds: . allopurinol  150 mg Oral Daily  . amiodarone  200 mg Oral BID  . apixaban  5 mg Oral BID  . bisacodyl  10 mg Rectal Daily  . busPIRone  10 mg Oral BID  . insulin aspart  0-5 Units Subcutaneous QHS  . insulin aspart  0-9 Units Subcutaneous TID WC  . ipratropium-albuterol  3 mL Nebulization BID  . nystatin   Topical TID  . oxybutynin  2.5 mg Oral QHS  . pantoprazole  40 mg Oral BID  . PARoxetine  40 mg Oral [id number]  . polyethylene glycol  34 g Oral Daily  . pravastatin  20 mg Oral QHS  .  sucralfate  1 g Oral QID   Continuous Infusions: PRN Meds:.acetaminophen **OR** acetaminophen, albuterol, colchicine, HYDROcodone-acetaminophen, morphine injection, ondansetron **OR** ondansetron (ZOFRAN) IV, phenol, polyethylene glycol   Assessment: Abdominal pain  Plan: Her source of her abdominal pain remains unclear However, patient has been followed as an outpatient with Dr. Vicente Males and he knows the patient well Given her pacemaker and that she is unable to have MRCP, Dr. Vicente Males has recommended EUS and this was discussed with primary team, Dr. Margaretmary Eddy, or working on inpatient transfer for EUS given ongoing abdominal pain rule out biliary obstruction  If patient is not being transferred today, from my  standpoint, okay to resume Eliquis, as we would not want to delay restarting the medication   Eliquis can be held once she is transferred to the inpatient facility according to their timeline of when the procedure can be done there.  Patient reports having 2-3 soft bowel movements between today and yesterday and this has not helped her abdominal pain.  Above discussed with Dr. Estanislado Pandy Follow-up with Dr. Vicente Males as an outpatient to discuss gastric polyp removal   LOS: 6 days   Deborah Antigua, MD 06/20/2018, 3:25 PM

## 2018-06-20 NOTE — Consult Note (Signed)
ANTICOAGULATION CONSULT NOTE - Initial Consult  Pharmacy Consult for apixaban dosing Indication: atrial fibrillation  Allergies  Allergen Reactions  . Cephalosporins Other (See Comments)    Reaction:  Unknown   . Fish Allergy Shortness Of Breath  . Macrolides And Ketolides Other (See Comments)    Reaction:  Unknown  Other Reaction: Intolerance Other Reaction: Intolerance   . Meperidine Shortness Of Breath, Nausea Only and Other (See Comments)    Reaction:  Stomach pain   . Other Nausea Only, Rash and Other (See Comments)    Uncoded Allergy. Allergen: NON-STEROIDS, Other Reaction: Not Assessed bextra - hands and feet swell Shellfish - SOB Shellfish - SOB Other Reaction: Not Assessed  . Prednisone Anaphylaxis and Other (See Comments)    Reaction:  Increases pts blood sugar  Pt states that she is allergic to all steroids.    . Shellfish Allergy Anaphylaxis, Shortness Of Breath, Diarrhea, Nausea Only and Rash    Reaction:  Stomach pain  Other reaction(s): Other (See Comments) Reaction:Stomach pain   . Sulfa Antibiotics Shortness Of Breath, Diarrhea, Nausea Only and Other (See Comments)    Reaction:  Stomach pain  Reaction:Stomach pain  Other Reaction: Intolerance   . Sulfacetamide Sodium Diarrhea, Nausea Only, Other (See Comments) and Shortness Of Breath    Reaction:  Stomach pain   . Telbivudine     Other reaction(s): Other (See Comments) Reaction:Unknown   . Uloric [Febuxostat] Anaphylaxis    Locks pt's body up   . Aspirin Other (See Comments)    Reaction:  Stomach pain   . Celecoxib Other (See Comments)    Reaction:  GI bleed, weakness, and stomach pain.    . Cephalexin Diarrhea, Nausea Only and Other (See Comments)    Reaction:  Stomach pain   . Erythromycin Diarrhea, Nausea Only and Other (See Comments)    Reaction:  Stomach pain   . Hydromorphone Other (See Comments)    Reaction:  Hypotension   . Iodinated Diagnostic Agents Rash and Other (See Comments)    Pt states that she is unable to have because she has chronic kidney disease.   Pt states that she is unable to have because she has chronic kidney disease. CKD CKD  . Oxycodone Other (See Comments)    Reaction:  Stomach pain   . Atorvastatin Nausea Only and Other (See Comments)    Reaction:  Weakness   . Codeine Diarrhea, Nausea Only and Other (See Comments)    Reaction:  Stomach pain Pt tolerates morphine   . Doxycycline     Stomach pain   . Tape Other (See Comments)    Reaction:  Causes pts skin to tear  Pt states that she is able to use paper tape.      . Valdecoxib Swelling and Other (See Comments)    Pt states that her hands and feet swell.    . Iodine Rash  . Morphine And Related Rash    Patient Measurements: Height: 5\' 4"  (162.6 cm) Weight: 231 lb 0.7 oz (104.8 kg) IBW/kg (Calculated) : 54.7 Heparin Dosing Weight:   Vital Signs: Temp: 98.2 F (36.8 C) (10/09 1204) Temp Source: Oral (10/09 1204) BP: 144/67 (10/09 1204) Pulse Rate: 71 (10/09 1204)  Labs: Recent Labs    06/18/18 0407 06/20/18 0333  HGB 9.9* 10.3*  HCT 29.9* 32.0*  PLT 192 216  CREATININE 1.27* 1.15*    Estimated Creatinine Clearance: 48.3 mL/min (A) (by C-G formula based on SCr of 1.15 mg/dL (H)).  Medical History: Past Medical History:  Diagnosis Date  . A-fib ([place]) 2016  . Acid reflux   . Anxiety   . Bowel obstruction ([place])   . CHF (congestive heart failure) ([place]) 2017  . Chronic kidney disease (CKD) stage G3a/A1, moderately decreased glomerular filtration rate (GFR) between 45-59 mL/min/1.73 square meter and albuminuria creatinine ratio less than 30 mg/g (H[facility])   . Diabetes mellitus without complication ([place])   . Dyspnea   . Fatty liver   . GI bleed   . Gout   . Hypertension   . Morbid obesity ([place])   . Osteoarthritis   . Osteoporosis   . Paroxysmal atrial fibrillation ([facility])   . Presence of permanent cardiac pacemaker   . Sinus node dysfunction ([place]) 08/24/2017  . TIA  (transient ischemic attack)     Medications:  Medications Prior to Admission  Medication Sig Dispense Refill Last Dose  . acetaminophen (TYLENOL) 325 MG tablet Take 2 tablets (650 mg total) by mouth every 6 (six) hours as needed for mild pain (or Fever >/= 101).   Past Week at PRN  . allopurinol (ZYLOPRIM) 100 MG tablet Take 150 mg by mouth daily.    06/14/2018 at 0800  . amiodarone (PACERONE) 200 MG tablet Take 1 tablet (200 mg total) by mouth 2 (two) times daily. 60 tablet 3 06/14/2018 at 0800  . apixaban (ELIQUIS) 5 MG TABS tablet Take 1 tablet (5 mg total) by mouth 2 (two) times daily. 180 tablet 3 06/14/2018 at 0800  . benzocaine-Menthol (DERMOPLAST) 20-0.5 % AERO Apply 1 application topically 4 (four) times daily as needed for irritation.   Unknown at PRN  . bisacodyl (DULCOLAX) 10 MG suppository Place 1 suppository (10 mg total) rectally daily as needed for moderate constipation. 12 suppository 0 Unknown at PRN  . busPIRone (BUSPAR) 10 MG tablet Take 10 mg by mouth 2 (two) times daily.    06/14/2018 at 0800  . cholecalciferol (VITAMIN D) 1000 units tablet Take 1,000 Units by mouth daily.   06/14/2018 at 0800  . colchicine 0.6 MG tablet Take 0.6 mg by mouth daily as needed (gout).    Unknown at PRN  . dibucaine (NUPERCAINAL) 1 % OINT Place 1 application rectally as needed for hemorrhoids.  0 Unknown at PRN  . docusate sodium (COLACE) 100 MG capsule Take 100 mg by mouth at bedtime.    Unknown at PRN  . furosemide (LASIX) 40 MG tablet Take 1 tablet (40 mg total) by mouth 2 (two) times daily. 30 tablet  06/14/2018 at 0800  . HYDROcodone-acetaminophen (NORCO/VICODIN) 5-325 MG tablet Take 1 tablet by mouth every 6 (six) hours as needed for moderate pain. 120 tablet 0 Past Week at PRN  . hydrocortisone 2.5 % cream Apply 1 application topically daily as needed (rash).   Unknown at PRN  . insulin NPH-regular Human (NOVOLIN 70/30) (70-30) 100 UNIT/ML injection Inject 22 Units into the skin 2 (two) times  daily.    06/14/2018 at 0800  . ipratropium-albuterol (DUONEB) 0.5-2.5 (3) MG/3ML SOLN Take 3 mLs by nebulization 2 (two) times daily. 360 mL  Past Week at PRN  . Multiple Vitamin (MULTIVITAMIN WITH MINERALS) TABS tablet Take 1 tablet by mouth daily.   06/14/2018 at 0800  . neomycin-bacitracin-polymyxin (NEOSPORIN) ointment Apply topically as needed for wound care. 15 g 0 Unknown at PRN  . nitrofurantoin, macrocrystal-monohydrate, (MACROBID) 100 MG capsule Take 100 mg by mouth 2 (two) times daily.    06/14/2018 at 0800  . NON  FORMULARY Diet Type: Mechanical Soft Chopped   Taking  . oxybutynin (DITROPAN) 5 MG tablet Take 10 mg by mouth 2 (two) times daily.   06/14/2018 at 0800  . OXYGEN Inhale 2 L into the lungs continuous.   Taking  . pantoprazole (PROTONIX) 40 MG tablet Take 1 tablet (40 mg total) by mouth 2 (two) times daily. 60 tablet 0 06/14/2018 at 0800  . PARoxetine (PAXIL) 40 MG tablet Take 40 mg by mouth every morning.   06/14/2018 at 0800  . Polyethyl Glycol-Propyl Glycol (SYSTANE) 0.4-0.3 % SOLN Place 1 drop into both eyes 2 (two) times daily as needed.   Unknown at PRN  . pravastatin (PRAVACHOL) 20 MG tablet Take 20 mg by mouth at bedtime.    06/13/2018 at 2000  . talc (ZEASORB) powder Apply 1 application topically as needed (under the breast irritation).   Unknown at PRN  . traZODone (DESYREL) 50 MG tablet Take 50-100 mg by mouth at bedtime.    06/13/2018 at 2000  . diphenhydrAMINE (BENADRYL) 25 mg capsule Take 1 capsule (25 mg total) by mouth at bedtime. (Patient not taking: Reported on 06/15/2018) 30 capsule 0 Not Taking at Unknown time  . ferrous sulfate 325 (65 FE) MG EC tablet Take 1 tablet (325 mg total) by mouth 2 (two) times daily. 60 tablet 3 Taking  . lidocaine (LIDODERM) 5 % Place 1 patch onto the skin daily. Remove & Discard patch within 12 hours or as directed by MD (Patient not taking: Reported on 06/15/2018) 30 patch 0 Not Taking at Unknown time  . polyethylene glycol (MIRALAX /  GLYCOLAX) packet Take 17 g by mouth daily. (Patient not taking: Reported on 06/15/2018) 14 each 0 Not Taking at Unknown time  . senna-docusate (SENOKOT-S) 8.6-50 MG tablet Take 1 tablet by mouth every morning.   Taking  . sucralfate (CARAFATE) 1 GM/10ML suspension Take 10 mLs (1 g total) by mouth 4 (four) times daily. (Patient not taking: Reported on 06/15/2018) 420 mL 1 Not Taking at Unknown time    Assessment: 77 y.o.femalewith a known history of a. Fib, diabetes, hypertension, recent upper GI bleed with gastric polyp, anxiety, recent kyphoplasty presents to the emergency room with 1 week of worsening epigastric and right upper quadrant pain.  Apixaban originially held in the setting of completing EGD, but procedure no longer being done.  Patient awaiting EUS but will be transferred to OSH for procedure.  Awaiting bed and therefore plan is to restart apixaban.  Goal of Therapy:  Monitor platelets by anticoagulation protocol: Yes   Plan:  Apixaban 5 mg every 12 hours.  Forrest Moron, PharmD 06/20/2018,2:16 PM

## 2018-06-21 ENCOUNTER — Telehealth: Payer: Self-pay

## 2018-06-21 ENCOUNTER — Telehealth: Payer: Self-pay | Admitting: Gastroenterology

## 2018-06-21 ENCOUNTER — Other Ambulatory Visit: Payer: Self-pay

## 2018-06-21 DIAGNOSIS — R109 Unspecified abdominal pain: Secondary | ICD-10-CM

## 2018-06-21 DIAGNOSIS — K5649 Other impaction of intestine: Secondary | ICD-10-CM

## 2018-06-21 LAB — GLUCOSE, CAPILLARY
GLUCOSE-CAPILLARY: 176 mg/dL — AB (ref 70–99)
GLUCOSE-CAPILLARY: 252 mg/dL — AB (ref 70–99)
Glucose-Capillary: 219 mg/dL — ABNORMAL HIGH (ref 70–99)
Glucose-Capillary: 260 mg/dL — ABNORMAL HIGH (ref 70–99)
Glucose-Capillary: 202 mg/dL — ABNORMAL HIGH (ref 70–99)

## 2018-06-21 MED ORDER — NYSTATIN 100000 UNIT/GM EX POWD
Freq: Three times a day (TID) | CUTANEOUS | Status: DC | PRN
  Filled 2018-06-21: qty 15

## 2018-06-21 MED ORDER — DICYCLOMINE HCL 10 MG PO CAPS: 10.0000 mg | ORAL_CAPSULE | Freq: Four times a day (QID) | ORAL | 0 refills | Status: DC

## 2018-06-21 NOTE — Telephone Encounter (Signed)
I spoke with Dr. Vicente Males.  We agreed that Deborah Obrien will be sent from [place] to [facility] endo tomorrow, to arrive by 8am for upper EUS evaluation for a CBD stone. If a stone is seen, [facility] few days. If there is no stone and no other clear explanation for her pains, she will be discharged home from [facility].

## 2018-06-21 NOTE — Progress Notes (Signed)
GI update   Dr Owens Loffler GI at [facility]- They can do his EUS tomorrow at 8 am arrival time at the [facility] cone endoscopy unit.   Informed Dr Estanislado Pandy to help coordinate transfer-Hold Eloquis today .    Dr Jonathon Bellows MD,MRCP [facility]) Gastroenterology/Hepatology Pager: [phone]

## 2018-06-21 NOTE — Progress Notes (Signed)
Discharge cancelled per Dr Estanislado Pandy

## 2018-06-21 NOTE — Discharge Summary (Addendum)
[place] [facility] [place] NAME: Deborah Obrien    MR#:  [id number]  DATE OF BIRTH:  30-Jan-1941  DATE OF ADMISSION:  06/14/2018 ADMITTING PHYSICIAN: Hillary Bow, MD  DATE OF DISCHARGE: 06/22/2018  PRIMARY CARE PHYSICIAN: [name], MD   ADMISSION DIAGNOSIS:  Lower urinary tract infectious disease [N39.0] Pain of upper abdomen [R10.10] Non-intractable vomiting with nausea, unspecified vomiting type [R11.2]  DISCHARGE DIAGNOSIS:  Abdominal pain Intractable nausea vomiting Type 2 diabetes mellitus Acute kidney injury on chronic kidney disease stage III Proximal atrial fibrillation Common bile duct dilatation SECONDARY DIAGNOSIS:   Past Medical History:  Diagnosis Date  . A-fib ([place]) 2016  . Acid reflux   . Anxiety   . Bowel obstruction ([place])   . CHF (congestive heart failure) ([place]) 2017  . Chronic kidney disease (CKD) stage G3a/A1, moderately decreased glomerular filtration rate (GFR) between 45-59 mL/min/1.73 square meter and albuminuria creatinine ratio less than 30 mg/g (HCC)   . Diabetes mellitus without complication ([place])   . Dyspnea   . Fatty liver   . GI bleed   . Gout   . Hypertension   . Morbid obesity ([place])   . Osteoarthritis   . Osteoporosis   . Paroxysmal atrial fibrillation (HCC)   . Presence of permanent cardiac pacemaker   . Sinus node dysfunction ([place]) 08/24/2017  . TIA (transient ischemic attack)      ADMITTING HISTORY Deborah Obrien  is a 77 y.o. female with a known history of diabetes, hypertension, recent upper GI bleed with gastric polyp, anxiety, recent kyphoplasty presents to the emergency room with 1 week of worsening epigastric and right upper quadrant pain.  The patient went to her GI physician Dr. Georgeann Oppenheim office who referred her to the emergency room.  A CT scan of the abdomen and pelvis has been checked and is normal.  Urinalysis shows UTI.  Mild acute kidney injury and patient is being  admitted to the hospital. Patient was started on Macrobid yesterday by her primary care physician due to diagnosis of UTI.  HOSPITAL COURSE:  Patient was admitted to our medical floor.  She was worked up with CT abdomen, abdominal ultrasound and HIDA scan.  She had mildly elevated liver function tests.  Abdominal pain persisted patient unable to eat any food or drink any fluids secondary to severe pain.  Any oral intake aggravated abdominal pain.  Was started on ciprofloxacin antibiotic initially at the time of admission for UTI.  Urine culture was showing multiple contaminated species antibiotics were stopped. Patient has history of gastric polyp.  Gastroenterology recommended endoscopic ultrasound.  Imaging studies done in our hospital have been normal except for common bile duct dilatation to 9 mm. Case was discussed by GI physician with Dr. Owens Loffler at [facility].  Patient has been accepted for endoscopic ultrasound procedure and further intervention.  Patient during her stay in the hospital received IV morphine for abdominal pain.  Patients Eliquis was stopped.  CONSULTS OBTAINED:  Treatment Team:  Virgel Manifold, MD Jonathon Bellows, MD [name], MD  DRUG ALLERGIES:   Allergies  Allergen Reactions  . Cephalosporins Other (See Comments)    Reaction:  Unknown   . Fish Allergy Shortness Of Breath  . Macrolides And Ketolides Other (See Comments)    Reaction:  Unknown  Other Reaction: Intolerance Other Reaction: Intolerance   . Meperidine Shortness Of Breath, Nausea Only and Other (See Comments)    Reaction:  Stomach pain   . Other Nausea Only, Rash and Other (See Comments)    Uncoded Allergy. Allergen: NON-STEROIDS, Other Reaction: Not Assessed bextra - hands and feet swell Shellfish - SOB Shellfish - SOB Other Reaction: Not Assessed  . Prednisone Anaphylaxis and Other (See Comments)    Reaction:  Increases pts blood sugar  Pt states that she is allergic to  all steroids.    . Shellfish Allergy Anaphylaxis, Shortness Of Breath, Diarrhea, Nausea Only and Rash    Reaction:  Stomach pain  Other reaction(s): Other (See Comments) Reaction:Stomach pain   . Sulfa Antibiotics Shortness Of Breath, Diarrhea, Nausea Only and Other (See Comments)    Reaction:  Stomach pain  Reaction:Stomach pain  Other Reaction: Intolerance   . Sulfacetamide Sodium Diarrhea, Nausea Only, Other (See Comments) and Shortness Of Breath    Reaction:  Stomach pain   . Telbivudine     Other reaction(s): Other (See Comments) Reaction:Unknown   . Uloric [Febuxostat] Anaphylaxis    Locks pt's body up   . Aspirin Other (See Comments)    Reaction:  Stomach pain   . Celecoxib Other (See Comments)    Reaction:  GI bleed, weakness, and stomach pain.    . Cephalexin Diarrhea, Nausea Only and Other (See Comments)    Reaction:  Stomach pain   . Erythromycin Diarrhea, Nausea Only and Other (See Comments)    Reaction:  Stomach pain   . Hydromorphone Other (See Comments)    Reaction:  Hypotension   . Iodinated Diagnostic Agents Rash and Other (See Comments)    Pt states that she is unable to have because she has chronic kidney disease.   Pt states that she is unable to have because she has chronic kidney disease. CKD CKD  . Oxycodone Other (See Comments)    Reaction:  Stomach pain   . Atorvastatin Nausea Only and Other (See Comments)    Reaction:  Weakness   . Codeine Diarrhea, Nausea Only and Other (See Comments)    Reaction:  Stomach pain Pt tolerates morphine   . Doxycycline     Stomach pain   . Tape Other (See Comments)    Reaction:  Causes pts skin to tear  Pt states that she is able to use paper tape.      . Valdecoxib Swelling and Other (See Comments)    Pt states that her hands and feet swell.    . Iodine Rash  . Morphine And Related Rash    DISCHARGE MEDICATIONS:   Allergies as of 06/21/2018      Reactions   Cephalosporins Other (See Comments)    Reaction:  Unknown    Fish Allergy Shortness Of Breath   Macrolides And Ketolides Other (See Comments)   Reaction:  Unknown  Other Reaction: Intolerance Other Reaction: Intolerance   Meperidine Shortness Of Breath, Nausea Only, Other (See Comments)   Reaction:  Stomach pain    Other Nausea Only, Rash, Other (See Comments)   Uncoded Allergy. Allergen: NON-STEROIDS, Other Reaction: Not Assessed bextra - hands and feet swell Shellfish - SOB Shellfish - SOB Other Reaction: Not Assessed   Prednisone Anaphylaxis, Other (See Comments)   Reaction:  Increases pts blood sugar  Pt states that she is allergic to all steroids.     Shellfish Allergy Anaphylaxis, Shortness Of Breath, Diarrhea, Nausea Only, Rash   Reaction:  Stomach pain  Other reaction(s): Other (See Comments) Reaction:Stomach pain    Sulfa Antibiotics Shortness Of Breath, Diarrhea, Nausea  Only, Other (See Comments)   Reaction:  Stomach pain  Reaction:Stomach pain  Other Reaction: Intolerance   Sulfacetamide Sodium Diarrhea, Nausea Only, Other (See Comments), Shortness Of Breath   Reaction:  Stomach pain    Telbivudine    Other reaction(s): Other (See Comments) Reaction:Unknown    Uloric [febuxostat] Anaphylaxis   Locks pt's body up   Aspirin Other (See Comments)   Reaction:  Stomach pain    Celecoxib Other (See Comments)   Reaction:  GI bleed, weakness, and stomach pain.     Cephalexin Diarrhea, Nausea Only, Other (See Comments)   Reaction:  Stomach pain    Erythromycin Diarrhea, Nausea Only, Other (See Comments)   Reaction:  Stomach pain    Hydromorphone Other (See Comments)   Reaction:  Hypotension    Iodinated Diagnostic Agents Rash, Other (See Comments)   Pt states that she is unable to have because she has chronic kidney disease.   Pt states that she is unable to have because she has chronic kidney disease. CKD CKD   Oxycodone Other (See Comments)   Reaction:  Stomach pain    Atorvastatin Nausea Only,  Other (See Comments)   Reaction:  Weakness    Codeine Diarrhea, Nausea Only, Other (See Comments)   Reaction:  Stomach pain Pt tolerates morphine    Doxycycline    Stomach pain    Tape Other (See Comments)   Reaction:  Causes pts skin to tear  Pt states that she is able to use paper tape.     Valdecoxib Swelling, Other (See Comments)   Pt states that her hands and feet swell.     Iodine Rash   Morphine And Related Rash      Medication List    STOP taking these medications   apixaban 5 MG Tabs tablet Commonly known as:  ELIQUIS   diphenhydrAMINE 25 mg capsule Commonly known as:  BENADRYL   nitrofurantoin (macrocrystal-monohydrate) 100 MG capsule Commonly known as:  MACROBID   polyethylene glycol packet Commonly known as:  MIRALAX / GLYCOLAX   pravastatin 20 MG tablet Commonly known as:  PRAVACHOL   sucralfate 1 GM/10ML suspension Commonly known as:  CARAFATE     TAKE these medications   acetaminophen 325 MG tablet Commonly known as:  TYLENOL Take 2 tablets (650 mg total) by mouth every 6 (six) hours as needed for mild pain (or Fever >/= 101).   allopurinol 100 MG tablet Commonly known as:  ZYLOPRIM Take 150 mg by mouth daily.   amiodarone 200 MG tablet Commonly known as:  PACERONE Take 1 tablet (200 mg total) by mouth 2 (two) times daily.   benzocaine-Menthol 20-0.5 % Aero Commonly known as:  DERMOPLAST Apply 1 application topically 4 (four) times daily as needed for irritation.   bisacodyl 10 MG suppository Commonly known as:  DULCOLAX Place 1 suppository (10 mg total) rectally daily as needed for moderate constipation.   busPIRone 10 MG tablet Commonly known as:  BUSPAR Take 10 mg by mouth 2 (two) times daily.   cholecalciferol 1000 units tablet Commonly known as:  VITAMIN D Take 1,000 Units by mouth daily.   colchicine 0.6 MG tablet Take 0.6 mg by mouth daily as needed (gout).   dibucaine 1 % Oint Commonly known as:  NUPERCAINAL Place 1  application rectally as needed for hemorrhoids.   dicyclomine 10 MG capsule Commonly known as:  BENTYL Take 1 capsule (10 mg total) by mouth 4 (four) times daily for 7 days.  docusate sodium 100 MG capsule Commonly known as:  COLACE Take 100 mg by mouth at bedtime.   ferrous sulfate 325 (65 FE) MG EC tablet Take 1 tablet (325 mg total) by mouth 2 (two) times daily.   furosemide 40 MG tablet Commonly known as:  LASIX Take 1 tablet (40 mg total) by mouth 2 (two) times daily.   HYDROcodone-acetaminophen 5-325 MG tablet Commonly known as:  NORCO/VICODIN Take 1 tablet by mouth every 6 (six) hours as needed for moderate pain.   hydrocortisone 2.5 % cream Apply 1 application topically daily as needed (rash).   insulin NPH-regular Human (70-30) 100 UNIT/ML injection Commonly known as:  NOVOLIN 70/30 Inject 22 Units into the skin 2 (two) times daily.   ipratropium-albuterol 0.5-2.5 (3) MG/3ML Soln Commonly known as:  DUONEB Take 3 mLs by nebulization 2 (two) times daily.   lidocaine 5 % Commonly known as:  LIDODERM Place 1 patch onto the skin daily. Remove & Discard patch within 12 hours or as directed by MD   multivitamin with minerals Tabs tablet Take 1 tablet by mouth daily.   neomycin-bacitracin-polymyxin ointment Commonly known as:  NEOSPORIN Apply topically as needed for wound care.   NON FORMULARY Diet Type: Mechanical Soft Chopped   oxybutynin 5 MG tablet Commonly known as:  DITROPAN Take 10 mg by mouth 2 (two) times daily.   OXYGEN Inhale 2 L into the lungs continuous.   pantoprazole 40 MG tablet Commonly known as:  PROTONIX Take 1 tablet (40 mg total) by mouth 2 (two) times daily.   PARoxetine 40 MG tablet Commonly known as:  PAXIL Take 40 mg by mouth every morning.   senna-docusate 8.6-50 MG tablet Commonly known as:  Senokot-S Take 1 tablet by mouth every morning.   SYSTANE 0.4-0.3 % Soln Generic drug:  Polyethyl Glycol-Propyl Glycol Place 1 drop  into both eyes 2 (two) times daily as needed.   traZODone 50 MG tablet Commonly known as:  DESYREL Take 50-100 mg by mouth at bedtime.   ZEASORB powder Generic drug:  talc Apply 1 application topically as needed (under the breast irritation).       Today  Evaluated today Still has abdominal pain Increased nausea and vomiting upon oral intake Discussed the plan of transfer to [facility] for endoscopic ultrasound  VITAL SIGNS:  Blood pressure 140/61, pulse 72, temperature 97.7 F (36.5 C), temperature source Oral, resp. rate 16, height 5\' 4"  (1.626 m), weight 105.2 kg, SpO2 99 %.  I/O:    Intake/Output Summary (Last 24 hours) at 06/21/2018 1452 Last data filed at 06/20/2018 2330 Gross per 24 hour  Intake -  Output 201 ml  Net -201 ml    PHYSICAL EXAMINATION:  Physical Exam  GENERAL:  77 y.o.-year-old patient lying in the bed with no acute distress.  LUNGS: Normal breath sounds bilaterally, no wheezing, rales,rhonchi or crepitation. No use of accessory muscles of respiration.  CARDIOVASCULAR: S1, S2 normal. No murmurs, rubs, or gallops.  ABDOMEN: Soft, tenderness right upper quadrant, non-distended. Bowel sounds present. No organomegaly or mass.  NEUROLOGIC: Moves all 4 extremities. PSYCHIATRIC: The patient is alert and oriented x 3.  SKIN: No obvious rash, lesion, or ulcer.   DATA REVIEW:   CBC Recent Labs  Lab 06/20/18 0333  WBC 3.5*  HGB 10.3*  HCT 32.0*  PLT 216    Chemistries  Recent Labs  Lab 06/20/18 0333  NA 138  K 4.3  CL 102  CO2 28  GLUCOSE  176*  BUN 9  CREATININE 1.15*  CALCIUM 8.7*  AST 33  ALT 20  ALKPHOS 164*  BILITOT 1.1    Cardiac Enzymes No results for input(s): TROPONINI in the last 168 hours.  Microbiology Results  Results for orders placed or performed during the hospital encounter of 06/14/18  Urine Culture     Status: Abnormal   Collection Time: 06/16/18 12:31 PM  Result Value Ref Range Status   Specimen  Description   Final    URINE, RANDOM Performed at [facility], [address] [place], [place] [address]    Special Requests   Final    cipro Normal Performed at [facility], [address] [place], [place] [address]    Culture MULTIPLE SPECIES PRESENT, SUGGEST RECOLLECTION (A)  Final   Report Status 06/18/2018 FINAL  Final    RADIOLOGY:  No results found.  Follow up with PCP in 1 week.  Management plans discussed with the patient, family and they are in agreement.  CODE STATUS: Full code    Code Status Orders  (From admission, onward)         Start     Ordered   06/14/18 1509  Full code  Continuous     06/14/18 1509        Code Status History    Date Active Date Inactive Code Status Order ID Comments User Context   04/05/2018 0329 04/23/2018 1751 Full Code [id number]  Harrie Foreman, MD Inpatient   02/05/2018 0035 02/06/2018 2136 Full Code [id number]  Elwyn Reach, MD ED   08/28/2017 1121 08/30/2017 1422 Full Code [id number]  Deboraha Sprang, MD Inpatient   06/12/2017 0213 06/13/2017 1500 Full Code [id number]  Quintella Baton, MD Inpatient   08/25/2016 0940 08/25/2016 1416 Full Code [id number]  Corey Skains, MD Inpatient   01/25/2016 0622 01/28/2016 1800 Full Code [id number]  Saundra Shelling, MD ED   07/22/2015 0107 07/25/2015 1609 Full Code [id number]  Harrie Foreman, MD Inpatient   07/01/2015 1605 07/03/2015 1659 Full Code [id number]  Isaias Cowman, MD Inpatient   06/29/2015 1751 07/01/2015 1605 Full Code [id number]  Fritzi Mandes, MD Inpatient   02/20/2015 2322 02/25/2015 1753 Full Code [id number]  Marlyce Huge, MD ED      TOTAL TIME TAKING CARE OF THIS PATIENT ON DAY OF DISCHARGE: more than 33 minutes.   Saundra Shelling M.D on 06/21/2018 at 2:52 PM  Between 7am to 6pm - Pager - [phone]  After 6pm go to [email or web address] - password [email or web address] [facility]  Office  [phone]  CC: Primary care physician;  [name], MD  Note: This dictation was prepared with Dragon dictation along with smaller phrase technology. Any transcriptional errors that result from this process are unintentional.

## 2018-06-21 NOTE — Progress Notes (Signed)
[employer] [facility] [place] NAME: Deborah Obrien    MR#:  [id number]  DATE OF BIRTH:  1941-06-15  SUBJECTIVE:  Patient seen and evaluated today Has abdominal pain on and off Abdominal pain gets worse on eating Unable to eat because of nausea and vomiting and abdominal discomfort Unable to eat a complete meal Discussed with our gastroenterologist Dr. Eloise Harman has discussed the case with Dr. Owens Loffler gastroenterologist at [facility] Patient has been scheduled for endoscopic ultrasound tomorrow morning at 8 AM at [place]:   Review of Systems  Constitutional: Negative for chills, fever and weight loss.  HENT: Negative for ear discharge, ear pain and nosebleeds.   Eyes: Negative for blurred vision, pain and discharge.  Respiratory: Negative for sputum production, shortness of breath, wheezing and stridor.   Cardiovascular: Negative for chest pain, palpitations, orthopnea and PND.  Gastrointestinal: Positive for abdominal pain. Negative for diarrhea, nausea and vomiting.  Genitourinary: Negative for frequency and urgency.  Musculoskeletal: Positive for back pain. Negative for joint pain.  Neurological: Negative for sensory change, speech change, focal weakness and weakness.  Psychiatric/Behavioral: Negative for depression and hallucinations. The patient is not nervous/anxious.    Tolerating Diet:yes Tolerating PT: pending  DRUG ALLERGIES:   Allergies  Allergen Reactions  . Cephalosporins Other (See Comments)    Reaction:  Unknown   . Fish Allergy Shortness Of Breath  . Macrolides And Ketolides Other (See Comments)    Reaction:  Unknown  Other Reaction: Intolerance Other Reaction: Intolerance   . Meperidine Shortness Of Breath, Nausea Only and Other (See Comments)    Reaction:  Stomach pain   . Other Nausea Only, Rash and Other (See Comments)    Uncoded Allergy. Allergen:  NON-STEROIDS, Other Reaction: Not Assessed bextra - hands and feet swell Shellfish - SOB Shellfish - SOB Other Reaction: Not Assessed  . Prednisone Anaphylaxis and Other (See Comments)    Reaction:  Increases pts blood sugar  Pt states that she is allergic to all steroids.    . Shellfish Allergy Anaphylaxis, Shortness Of Breath, Diarrhea, Nausea Only and Rash    Reaction:  Stomach pain  Other reaction(s): Other (See Comments) Reaction:Stomach pain   . Sulfa Antibiotics Shortness Of Breath, Diarrhea, Nausea Only and Other (See Comments)    Reaction:  Stomach pain  Reaction:Stomach pain  Other Reaction: Intolerance   . Sulfacetamide Sodium Diarrhea, Nausea Only, Other (See Comments) and Shortness Of Breath    Reaction:  Stomach pain   . Telbivudine     Other reaction(s): Other (See Comments) Reaction:Unknown   . Uloric [Febuxostat] Anaphylaxis    Locks pt's body up   . Aspirin Other (See Comments)    Reaction:  Stomach pain   . Celecoxib Other (See Comments)    Reaction:  GI bleed, weakness, and stomach pain.    . Cephalexin Diarrhea, Nausea Only and Other (See Comments)    Reaction:  Stomach pain   . Erythromycin Diarrhea, Nausea Only and Other (See Comments)    Reaction:  Stomach pain   . Hydromorphone Other (See Comments)    Reaction:  Hypotension   . Iodinated Diagnostic Agents Rash and Other (See Comments)    Pt states that she is unable to have because she has chronic kidney disease.   Pt states that she is unable to have because she has chronic kidney disease. CKD CKD  . Oxycodone Other (See  Comments)    Reaction:  Stomach pain   . Atorvastatin Nausea Only and Other (See Comments)    Reaction:  Weakness   . Codeine Diarrhea, Nausea Only and Other (See Comments)    Reaction:  Stomach pain Pt tolerates morphine   . Doxycycline     Stomach pain   . Tape Other (See Comments)    Reaction:  Causes pts skin to tear  Pt states that she is able to use paper  tape.      . Valdecoxib Swelling and Other (See Comments)    Pt states that her hands and feet swell.    . Iodine Rash  . Morphine And Related Rash    VITALS:  Blood pressure (!) 113/56, pulse 68, temperature 98 F (36.7 C), temperature source Oral, resp. rate 20, height 5\' 4"  (1.626 m), weight 105.2 kg, SpO2 98 %.  PHYSICAL EXAMINATION:   Physical Exam  GENERAL:  77 y.o.-year-old patient lying in the bed with no acute distress.  EYES: Pupils equal, round, reactive to light and accommodation. No scleral icterus. Extraocular muscles intact.  HEENT: Head atraumatic, normocephalic. Oropharynx and nasopharynx clear.  NECK:  Supple, no jugular venous distention. No thyroid enlargement, no tenderness.  LUNGS: Normal breath sounds bilaterally, no wheezing, rales, rhonchi. No use of accessory muscles of respiration.  CARDIOVASCULAR: S1, S2 normal. No murmurs, rubs, or gallops.  ABDOMEN: Soft, epigastric and right upper quadrant tenderness nondistended. Bowel sounds present.  EXTREMITIES: No cyanosis, clubbing or edema b/l.    NEUROLOGIC: Cranial nerves II through XII are intact. No focal Motor or sensory deficits b/l.   PSYCHIATRIC:  patient is alert and oriented x 3.  SKIN: No obvious rash, lesion, or ulcer.   LABORATORY PANEL:  CBC Recent Labs  Lab 06/20/18 0333  WBC 3.5*  HGB 10.3*  HCT 32.0*  PLT 216    Chemistries  Recent Labs  Lab 06/20/18 0333  NA 138  K 4.3  CL 102  CO2 28  GLUCOSE 176*  BUN 9  CREATININE 1.15*  CALCIUM 8.7*  AST 33  ALT 20  ALKPHOS 164*  BILITOT 1.1   Cardiac Enzymes No results for input(s): TROPONINI in the last 168 hours. RADIOLOGY:  Nm Hepatobiliary Liver Func  Result Date: 06/19/2018 CLINICAL DATA:  Right upper quadrant pain with nausea and vomiting EXAM: NUCLEAR MEDICINE HEPATOBILIARY IMAGING TECHNIQUE: Sequential images of the abdomen were obtained out to 60 minutes following intravenous administration of radiopharmaceutical.  RADIOPHARMACEUTICALS:  5.58 mCi Tc-43m  Choletec IV COMPARISON:  June 17, 2018 ultrasound right upper quadrant FINDINGS: Liver uptake of radiotracer is normal. There is prompt visualization of small bowel, indicating patency of the common bile duct. Gallbladder is noted to be absent. No ectopic radiotracer. IMPRESSION: There is patency of the common bile duct, indicated by prompt visualization of small bowel. Gallbladder absent. No biliary leak evident. Electronically Signed   By: Lowella Grip III M.D.   On: 06/19/2018 13:36   ASSESSMENT AND PLAN:  Krisna Omar  is a 77 y.o. female with a known history of diabetes, hypertension, recent upper GI bleed with gastric polyp, anxiety, recent kyphoplasty presents to the emergency room with 1 week of worsening epigastric and right upper quadrant pain.  The patient went to her GI physician Dr. Georgeann Oppenheim office who referred her to the emergency room.  A CT scan of the abdomen and pelvis has been checked and is normal.   -Abdominal pain with persistent nausea and vomiting Hydrate with  IV fluids Will follow-up with gastroenterology at our hospital for EGD HIDA scan is normal Transferred to [facility] tomorrow morning 8 AM for endoscopic ultrasound by Dr. Owens Loffler N.p.o. after midnight Hold Eliquis  -Acute cystitis  Urine culture with multiple species.  Antibiotics discontinued  -Constipation Stool softeners   -Acute kidney injury over CKD stage III.  Improved with IV fluids Monitor renal function  -chronic low back pain-Patient has palpable pain from her right back coming to the right upper quadrant appears more musculoskeletal strain from her recent fall and vertebral fracture however cannot rule out G.I. issue as well.  -Diabetes mellitus.  Continue home insulin at lower dose.  Sliding scale insulin added  -Paroxysmal atrial fibrillation.  Eliquis on hold for procedure tomorrow  Case discussed with Care Management/Social  Worker. Management plans discussed with the patient, husband at bedside and they are in agreement.  RN Eliezer Lofts and kim were  present during the conversation  CODE STATUS: full  DVT Prophylaxis: lovenox  TOTAL TIME TAKING CARE OF THIS PATIENT: 32 minutes.  >50% time spent on counselling and coordination of care  POSSIBLE D/C IN 1-2 DAYS, DEPENDING ON CLINICAL CONDITION.  Note: This dictation was prepared with Dragon dictation along with smaller phrase technology. Any transcriptional errors that result from this process are unintentional.  Saundra Shelling M.D on 06/21/2018 at 11:55 AM  Between 7am to 6pm - Pager - [phone]  After 6pm go to [email or web address] - password [email or web address] [facility]  Office  [phone]  CC: Primary care physician; [name], MDPatient ID: Deborah Obrien, female   DOB: October 06, 1940, 77 y.o.   MRN: [id number]

## 2018-06-21 NOTE — Telephone Encounter (Signed)
Per Dr Andrey Campanile will coordinate the transfer for the pt procedure tomorrow at [place].

## 2018-06-21 NOTE — Progress Notes (Signed)
NT notified this RN that patient called out wanting something for pain; when I went to room, patient was lying in bed, eyes closed, mouth open and snoring; patient called this RN 20 mins later stating that she had "...been waiting since 5 mins 'til 1 for some pain medicine..."; I told patient that, when I was told she wanted pain medicine, I came to her room and she was asleep; "I have not been asleep!  I have been waiting for pain medicine for an hour and a half!"  This RN apologized to the patient that she had to wait for pain medication; When this RN brought Norco to the patient, she said, "I am not taking that..I want the Morphine.Marland Kitchenit's going to take that pill an hour to work. The doctor told me to take the Morphine.  Now I have a headache"; offered patient Tylenol for the headache, she refused, then asked for applesauce and graham crackers; I asked her if that would make her abdomen hurt again, she said, "Just bring the applesauce and graham crackers".  Applesauce and graham crackers placed on bedside table; [name], RN 06/21/2018 3:24 AM

## 2018-06-21 NOTE — Telephone Encounter (Signed)
The pt has been scheduled for EUS on 10/11 [facility] with Dr Ardis Hughs.

## 2018-06-21 NOTE — Telephone Encounter (Signed)
----- Message from [name], MD sent at 06/21/2018  7:57 AM EDT ----- Regarding: FW: Assistance with EUS Sharmeka Palmisano, My [facility] schedule for tomorrow was blocked out (joan error I think) until just late last week when I caught it and sheri opened it up. Currently I have 3 patients only.  They are spread out 8, 8:30 and 11:30/  Can you see if [facility] can get me MAC time tomorrow 9:30 to help with this patient?  If not 9:30 then I could do it later in the morning (last start time feasible would be 11 to allow time for me to get back in time for my afternoon office hours, but that only works if my currently scheduled 11;30 [facility] case can be bumped to a different day or done at 9?    Can you check on this early this morning and block my open [facility] spots out for tomorrow for now?  Thanks    ----- Message ----- From: Irving Copas., MD Sent: 06/20/2018   5:56 PM EDT To: [name], MD, Saundra Shelling, MD, # Subject: RE: Assistance with EUS                        Kiran, Thank you for the message. Sorry to hear about this patient. Dr. Tarri Glenn had reached out to me and the only time I had today was not able to be available for a procedure and I used my time off to do another inpatient. I do not have any availability this week unless the patient is an inpatient here at [facility] or [facility]. Those inpatients that need procedures, I cancel outpatients to do an Inpatient ERCP or EUS and if necessary I will and have. However, I have no outpatient availability next week until possibly Friday.  On Friday the 18th, I am hospital backup, but we are not supposed to have outpatients scheduled, but that could be a possible day for me, if there is availability from an Anesthesia perspective. I'm not sure about Dan's schedule, but I'll place Kamarri Fischetti on here, so that we can see if I could get time on next Friday AM while I'm hospital backup, to do procedure to help. Edessa Jakubowicz can you look into anesthesia  availability? Gabe  ----- Message ----- From: Jonathon Bellows, MD Sent: 06/20/2018   3:44 PM EDT To: [name], MD, Saundra Shelling, MD, # Subject: Assistance with EUS                            Good evening Dr. Ardis Hughs and Dr. Rush Landmark  I was wondering if you could help me out with this patient.  She is a 77 year old who was admitted to the hospital a few months back with a life-threatening gastrointestinal bleed and I found that she had a large polyp in the stomach that was bleeding and we obtained hemostasis with hemosray with intention to resect soon as an outpatient.     She was doing fine when all of a sudden last week she presented to my office with severe right upper quadrant pain.  Every time she eats she gets pain which is suggestive of a biliary type of pain.  We had to admit her because she could not get any food down and a CT scan of the abdomen showed no gross abnormalities.  She did have some abnormal liver function tests predominantly had alkaline phosphatase which tends to  rise up every time she has this colicky pain.  She has a pacemaker and hence we are not able to do an MRCP.  Status post cholecystectomy and she does have a common bile duct which is 9 mm which can be argued either postcholecystectomy or does it have  any other significance  in relation to her pain.  We are unable to get her out of the hospital as she is unable to keep any food down.  Every time she eats she gets severe pain.  She has never had this issue before.  We did try to get her transferred to your center to have an inpatient EUS as we are not able to get her out of the hospital due to inability to eat.  Reason for transfer was we do not have EUS capability here easily available on a regular basis.  I think Dr. Tarri Glenn reviewed the case and felt that it cannot be done until November 18.  We also did  try to get a transfer to [facility] and have not had any luck.  I am wondering if you can help with any form of  input, assistance with an EUS, or anything else that I could do.  If she does tolerate orally which we arere going to try yet again today then obviously can be done as an outpatient.  If she again fails is it  a possible to  squeeze her in for an urgent EUS  to rule out a stone in the common bile duct.  The other differential I can think, would be a sphincter of Oddi  dysfunction type 1 requiring a sphincterotomy.   We appreciate any form of assistance in this patient.  Regards    Kiran   C.c Dr Estanislado Pandy Hospitalist taking care of the patient DR Bonna Gains GI on service in the hospital

## 2018-06-21 NOTE — Progress Notes (Signed)
[facility] set up for 0800 arrival @ [facility]. Spoke with Danae Chen, RN @ [facility] to pass on information. Husband will pack up patient's stuff and come to [facility] in the morning for possible discharge from [facility].

## 2018-06-21 NOTE — Progress Notes (Signed)
Inpatient Diabetes Program Recommendations  AACE/ADA: New Consensus Statement on Inpatient Glycemic Control (2019)  Target Ranges:  Prepandial:   less than 140 mg/dL      Peak postprandial:   less than 180 mg/dL (1-2 hours)      Critically ill patients:  140 - 180 mg/dL   Results for Deborah Obrien, Deborah Obrien (MRN [id number]) as of 06/21/2018 13:55  Ref. Range 06/20/2018 07:34 06/20/2018 11:38 06/20/2018 16:32 06/20/2018 21:35 06/20/2018 21:53 06/21/2018 03:31 06/21/2018 07:36 06/21/2018 11:38  Glucose-Capillary Latest Ref Range: 70 - 99 mg/dL 170 (H) 229 (H) 211 (H) 189 (H) 175 (H) 219 (H) 260 (H) 176 (H)   Review of Glycemic Control  Outpatient Diabetes medications: 70/30 22 units BID Current orders for Inpatient glycemic control: Novolog 0-9 units TID with meals, Novolog 0-5 units QHS  Inpatient Diabetes Program Recommendations: Insulin - Basal: Please consider ordering Levemir 5 units Q24H.  NOTE: Noted in chart review, patient will transfer to [facility] in the morning for endoscopic ultrasound at 8am. Therefore, patient will be NPO after midnight.   Thanks, Barnie Alderman, RN, MSN, CDE Diabetes Coordinator Inpatient Diabetes Program [phone] (Team Pager from 8am to 5pm)

## 2018-06-22 ENCOUNTER — Inpatient Hospital Stay (HOSPITAL_COMMUNITY): Payer: Medicare Other | Admitting: Anesthesiology

## 2018-06-22 ENCOUNTER — Encounter (HOSPITAL_COMMUNITY)
Admission: RE | Disposition: A | Discharge status: Home / Self Care | Payer: Self-pay | Source: Home / Self Care | Attending: Gastroenterology

## 2018-06-22 ENCOUNTER — Encounter (HOSPITAL_COMMUNITY): Payer: Self-pay | Admitting: *Deleted

## 2018-06-22 ENCOUNTER — Ambulatory Visit (HOSPITAL_COMMUNITY)
Admission: RE | Admit: 2018-06-22 | Discharge: 2018-06-22 | DRG: 445 | Disposition: A | Discharge status: Home / Self Care | Payer: Medicare Other | Attending: Gastroenterology | Admitting: Gastroenterology

## 2018-06-22 DIAGNOSIS — K760 Fatty (change of) liver, not elsewhere classified: Secondary | ICD-10-CM | POA: Insufficient documentation

## 2018-06-22 DIAGNOSIS — K259 Gastric ulcer, unspecified as acute or chronic, without hemorrhage or perforation: Secondary | ICD-10-CM | POA: Insufficient documentation

## 2018-06-22 DIAGNOSIS — R112 Nausea with vomiting, unspecified: Secondary | ICD-10-CM | POA: Diagnosis not present

## 2018-06-22 DIAGNOSIS — I25119 Atherosclerotic heart disease of native coronary artery with unspecified angina pectoris: Secondary | ICD-10-CM | POA: Insufficient documentation

## 2018-06-22 DIAGNOSIS — Z950 Presence of cardiac pacemaker: Secondary | ICD-10-CM | POA: Insufficient documentation

## 2018-06-22 DIAGNOSIS — Z91041 Radiographic dye allergy status: Secondary | ICD-10-CM | POA: Insufficient documentation

## 2018-06-22 DIAGNOSIS — Z888 Allergy status to other drugs, medicaments and biological substances status: Secondary | ICD-10-CM | POA: Insufficient documentation

## 2018-06-22 DIAGNOSIS — F419 Anxiety disorder, unspecified: Secondary | ICD-10-CM | POA: Insufficient documentation

## 2018-06-22 DIAGNOSIS — N179 Acute kidney failure, unspecified: Secondary | ICD-10-CM | POA: Insufficient documentation

## 2018-06-22 DIAGNOSIS — I4891 Unspecified atrial fibrillation: Secondary | ICD-10-CM | POA: Insufficient documentation

## 2018-06-22 DIAGNOSIS — E1122 Type 2 diabetes mellitus with diabetic chronic kidney disease: Secondary | ICD-10-CM | POA: Insufficient documentation

## 2018-06-22 DIAGNOSIS — N390 Urinary tract infection, site not specified: Secondary | ICD-10-CM | POA: Insufficient documentation

## 2018-06-22 DIAGNOSIS — M810 Age-related osteoporosis without current pathological fracture: Secondary | ICD-10-CM | POA: Insufficient documentation

## 2018-06-22 DIAGNOSIS — Z882 Allergy status to sulfonamides status: Secondary | ICD-10-CM | POA: Insufficient documentation

## 2018-06-22 DIAGNOSIS — K838 Other specified diseases of biliary tract: Secondary | ICD-10-CM | POA: Diagnosis not present

## 2018-06-22 DIAGNOSIS — Z91048 Other nonmedicinal substance allergy status: Secondary | ICD-10-CM | POA: Insufficient documentation

## 2018-06-22 DIAGNOSIS — M109 Gout, unspecified: Secondary | ICD-10-CM | POA: Insufficient documentation

## 2018-06-22 DIAGNOSIS — Z9049 Acquired absence of other specified parts of digestive tract: Secondary | ICD-10-CM | POA: Insufficient documentation

## 2018-06-22 DIAGNOSIS — R748 Abnormal levels of other serum enzymes: Secondary | ICD-10-CM | POA: Diagnosis not present

## 2018-06-22 DIAGNOSIS — K828 Other specified diseases of gallbladder: Secondary | ICD-10-CM | POA: Insufficient documentation

## 2018-06-22 DIAGNOSIS — Z9889 Other specified postprocedural states: Secondary | ICD-10-CM | POA: Insufficient documentation

## 2018-06-22 DIAGNOSIS — Z885 Allergy status to narcotic agent status: Secondary | ICD-10-CM | POA: Insufficient documentation

## 2018-06-22 DIAGNOSIS — F329 Major depressive disorder, single episode, unspecified: Secondary | ICD-10-CM | POA: Insufficient documentation

## 2018-06-22 DIAGNOSIS — I739 Peripheral vascular disease, unspecified: Secondary | ICD-10-CM | POA: Insufficient documentation

## 2018-06-22 DIAGNOSIS — K922 Gastrointestinal hemorrhage, unspecified: Secondary | ICD-10-CM | POA: Insufficient documentation

## 2018-06-22 DIAGNOSIS — K5649 Other impaction of intestine: Secondary | ICD-10-CM

## 2018-06-22 DIAGNOSIS — Z881 Allergy status to other antibiotic agents status: Secondary | ICD-10-CM | POA: Insufficient documentation

## 2018-06-22 DIAGNOSIS — R1011 Right upper quadrant pain: Secondary | ICD-10-CM

## 2018-06-22 DIAGNOSIS — Z79899 Other long term (current) drug therapy: Secondary | ICD-10-CM | POA: Insufficient documentation

## 2018-06-22 DIAGNOSIS — K317 Polyp of stomach and duodenum: Secondary | ICD-10-CM | POA: Insufficient documentation

## 2018-06-22 DIAGNOSIS — R1013 Epigastric pain: Secondary | ICD-10-CM | POA: Insufficient documentation

## 2018-06-22 DIAGNOSIS — R000 Tachycardia, unspecified: Secondary | ICD-10-CM | POA: Insufficient documentation

## 2018-06-22 DIAGNOSIS — Z794 Long term (current) use of insulin: Secondary | ICD-10-CM | POA: Insufficient documentation

## 2018-06-22 DIAGNOSIS — N183 Chronic kidney disease, stage 3 (moderate): Secondary | ICD-10-CM | POA: Insufficient documentation

## 2018-06-22 DIAGNOSIS — I130 Hypertensive heart and chronic kidney disease with heart failure and stage 1 through stage 4 chronic kidney disease, or unspecified chronic kidney disease: Secondary | ICD-10-CM | POA: Insufficient documentation

## 2018-06-22 DIAGNOSIS — G709 Myoneural disorder, unspecified: Secondary | ICD-10-CM | POA: Insufficient documentation

## 2018-06-22 DIAGNOSIS — M1990 Unspecified osteoarthritis, unspecified site: Secondary | ICD-10-CM | POA: Insufficient documentation

## 2018-06-22 DIAGNOSIS — I480 Paroxysmal atrial fibrillation: Secondary | ICD-10-CM | POA: Insufficient documentation

## 2018-06-22 DIAGNOSIS — I509 Heart failure, unspecified: Secondary | ICD-10-CM | POA: Insufficient documentation

## 2018-06-22 DIAGNOSIS — Z8673 Personal history of transient ischemic attack (TIA), and cerebral infarction without residual deficits: Secondary | ICD-10-CM | POA: Insufficient documentation

## 2018-06-22 DIAGNOSIS — Z7901 Long term (current) use of anticoagulants: Secondary | ICD-10-CM | POA: Insufficient documentation

## 2018-06-22 DIAGNOSIS — Z886 Allergy status to analgesic agent status: Secondary | ICD-10-CM | POA: Insufficient documentation

## 2018-06-22 DIAGNOSIS — R109 Unspecified abdominal pain: Secondary | ICD-10-CM

## 2018-06-22 DIAGNOSIS — R0600 Dyspnea, unspecified: Secondary | ICD-10-CM | POA: Insufficient documentation

## 2018-06-22 DIAGNOSIS — K219 Gastro-esophageal reflux disease without esophagitis: Secondary | ICD-10-CM | POA: Insufficient documentation

## 2018-06-22 HISTORY — PX: ESOPHAGOGASTRODUODENOSCOPY (EGD) WITH PROPOFOL: SHX5813

## 2018-06-22 HISTORY — PX: EUS: SHX5427

## 2018-06-22 LAB — GLUCOSE, CAPILLARY: Glucose-Capillary: 239 mg/dL — ABNORMAL HIGH (ref 70–99)

## 2018-06-22 SURGERY — UPPER ENDOSCOPIC ULTRASOUND (EUS) RADIAL
Anesthesia: Monitor Anesthesia Care

## 2018-06-22 MED ORDER — HYDROCODONE-ACETAMINOPHEN 7.5-325 MG PO TABS
1.0000 | ORAL_TABLET | Freq: Once | ORAL | Status: AC | PRN
  Administered 2018-06-22: 1 via ORAL

## 2018-06-22 MED ORDER — PROPOFOL 500 MG/50ML IV EMUL
INTRAVENOUS | Status: DC | PRN
  Administered 2018-06-22: 75 ug/kg/min via INTRAVENOUS

## 2018-06-22 MED ORDER — LACTATED RINGERS IV SOLN
INTRAVENOUS | Status: DC | PRN
  Administered 2018-06-22: 10:00:00 via INTRAVENOUS

## 2018-06-22 MED ORDER — BUTAMBEN-TETRACAINE-BENZOCAINE 2-2-14 % EX AERO
INHALATION_SPRAY | CUTANEOUS | Status: DC | PRN
  Administered 2018-06-22: 1 via TOPICAL

## 2018-06-22 MED ORDER — SODIUM CHLORIDE 0.9 % IV SOLN: INTRAVENOUS | Status: DC

## 2018-06-22 MED ORDER — FENTANYL CITRATE (PF) 100 MCG/2ML IJ SOLN
INTRAMUSCULAR | Status: DC | PRN
  Administered 2018-06-22 (×2): 50 ug via INTRAVENOUS

## 2018-06-22 NOTE — Discharge Summary (Signed)
[facility] [place] NAME: Deborah Obrien    MR#:  [id number]  DATE OF BIRTH:  [birth year]  DATE OF ADMISSION:  06/22/2018 ADMITTING PHYSICIAN: [name], MD  DATE OF DISCHARGE: 06/22/2018 at 45 am for [place] PHYSICIAN: [name], MD   ADMISSION DIAGNOSIS:  bile duct stone  Abdominal pain Intractable nausea vomiting Paroxysmal atrial fibrillation DISCHARGE DIAGNOSIS:  Abdominal pain Intractable nausea vomiting Type 2 diabetes mellitus Acute kidney injury on chronic kidney disease stage III Proximal atrial fibrillation Common bile duct dilatation SECONDARY DIAGNOSIS:   Past Medical History:  Diagnosis Date  . A-fib ([place]) 2016  . Acid reflux   . Anxiety   . Bowel obstruction ([place])   . CHF (congestive heart failure) ([place]) 2017  . Chronic kidney disease (CKD) stage G3a/A1, moderately decreased glomerular filtration rate (GFR) between 45-59 mL/min/1.73 square meter and albuminuria creatinine ratio less than 30 mg/g (HCC)   . Diabetes mellitus without complication ([place])   . Dyspnea   . Fatty liver   . GI bleed   . Gout   . Hypertension   . Morbid obesity ([place])   . Osteoarthritis   . Osteoporosis   . Paroxysmal atrial fibrillation (HCC)   . Presence of permanent cardiac pacemaker   . Sinus node dysfunction ([place]) 08/24/2017  . TIA (transient ischemic attack)      ADMITTING HISTORY Deborah Obrien  is a 77 y.o. female with a known history of diabetes, hypertension, recent upper GI bleed with gastric polyp, anxiety, recent kyphoplasty presents to the emergency room with 1 week of worsening epigastric and right upper quadrant pain.  The patient went to her GI physician Dr. Georgeann Oppenheim office who referred her to the emergency room.  A CT scan of the abdomen and pelvis has been checked and is normal.  Urinalysis shows UTI.  Mild acute kidney injury and patient is being admitted to the  hospital. Patient was started on Macrobid yesterday by her primary care physician due to diagnosis of UTI.  HOSPITAL COURSE:  Patient was admitted to our medical floor.  She was worked up with CT abdomen, abdominal ultrasound and HIDA scan.  She had mildly elevated liver function tests.  Abdominal pain persisted patient unable to eat any food or drink any fluids secondary to severe pain.  Any oral intake aggravated abdominal pain.  Was started on ciprofloxacin antibiotic initially at the time of admission for UTI.  Urine culture was showing multiple contaminated species antibiotics were stopped. Patient has history of gastric polyp.  Gastroenterology recommended endoscopic ultrasound.  Imaging studies done in our hospital have been normal except for common bile duct dilatation to 9 mm. Case was discussed by GI physician with Dr. Owens Loffler at [facility].  Patient has been accepted for endoscopic ultrasound procedure and further intervention at [facility].  Patient during her stay in the hospital received IV morphine for abdominal pain.  Patients Eliquis was stopped for procedure  CONSULTS OBTAINED:    DRUG ALLERGIES:   Allergies  Allergen Reactions  . Cephalosporins Other (See Comments)    Reaction:  Unknown   . Fish Allergy Shortness Of Breath  . Macrolides And Ketolides Other (See Comments)    Reaction:  Unknown  Other Reaction: Intolerance Other Reaction: Intolerance   . Meperidine Shortness Of Breath, Nausea Only and Other (See Comments)    Reaction:  Stomach pain   . Other Nausea Only,  Rash and Other (See Comments)    Uncoded Allergy. Allergen: NON-STEROIDS, Other Reaction: Not Assessed bextra - hands and feet swell Shellfish - SOB Shellfish - SOB Other Reaction: Not Assessed  . Prednisone Anaphylaxis and Other (See Comments)    Reaction:  Increases pts blood sugar  Pt states that she is allergic to all steroids.    . Shellfish Allergy Anaphylaxis, Shortness  Of Breath, Diarrhea, Nausea Only and Rash    Reaction:  Stomach pain  Other reaction(s): Other (See Comments) Reaction:Stomach pain   . Sulfa Antibiotics Shortness Of Breath, Diarrhea, Nausea Only and Other (See Comments)    Reaction:  Stomach pain  Reaction:Stomach pain  Other Reaction: Intolerance   . Sulfacetamide Sodium Diarrhea, Nausea Only, Other (See Comments) and Shortness Of Breath    Reaction:  Stomach pain   . Telbivudine     Other reaction(s): Other (See Comments) Reaction:Unknown   . Uloric [Febuxostat] Anaphylaxis    Locks pt's body up   . Aspirin Other (See Comments)    Reaction:  Stomach pain   . Celecoxib Other (See Comments)    Reaction:  GI bleed, weakness, and stomach pain.    . Cephalexin Diarrhea, Nausea Only and Other (See Comments)    Reaction:  Stomach pain   . Erythromycin Diarrhea, Nausea Only and Other (See Comments)    Reaction:  Stomach pain   . Hydromorphone Other (See Comments)    Reaction:  Hypotension   . Iodinated Diagnostic Agents Rash and Other (See Comments)    Pt states that she is unable to have because she has chronic kidney disease.   Pt states that she is unable to have because she has chronic kidney disease. CKD CKD  . Oxycodone Other (See Comments)    Reaction:  Stomach pain   . Atorvastatin Nausea Only and Other (See Comments)    Reaction:  Weakness   . Codeine Diarrhea, Nausea Only and Other (See Comments)    Reaction:  Stomach pain Pt tolerates morphine   . Doxycycline     Stomach pain   . Tape Other (See Comments)    Reaction:  Causes pts skin to tear  Pt states that she is able to use paper tape.      . Valdecoxib Swelling and Other (See Comments)    Pt states that her hands and feet swell.    . Iodine Rash  . Morphine And Related Rash    DISCHARGE MEDICATIONS:   Allergies as of 06/22/2018      Reactions   Cephalosporins Other (See Comments)   Reaction:  Unknown    Fish Allergy Shortness Of Breath    Macrolides And Ketolides Other (See Comments)   Reaction:  Unknown  Other Reaction: Intolerance Other Reaction: Intolerance   Meperidine Shortness Of Breath, Nausea Only, Other (See Comments)   Reaction:  Stomach pain    Other Nausea Only, Rash, Other (See Comments)   Uncoded Allergy. Allergen: NON-STEROIDS, Other Reaction: Not Assessed bextra - hands and feet swell Shellfish - SOB Shellfish - SOB Other Reaction: Not Assessed   Prednisone Anaphylaxis, Other (See Comments)   Reaction:  Increases pts blood sugar  Pt states that she is allergic to all steroids.     Shellfish Allergy Anaphylaxis, Shortness Of Breath, Diarrhea, Nausea Only, Rash   Reaction:  Stomach pain  Other reaction(s): Other (See Comments) Reaction:Stomach pain    Sulfa Antibiotics Shortness Of Breath, Diarrhea, Nausea Only, Other (See Comments)   Reaction:  Stomach pain  Reaction:Stomach pain  Other Reaction: Intolerance   Sulfacetamide Sodium Diarrhea, Nausea Only, Other (See Comments), Shortness Of Breath   Reaction:  Stomach pain    Telbivudine    Other reaction(s): Other (See Comments) Reaction:Unknown    Uloric [febuxostat] Anaphylaxis   Locks pt's body up   Aspirin Other (See Comments)   Reaction:  Stomach pain    Celecoxib Other (See Comments)   Reaction:  GI bleed, weakness, and stomach pain.     Cephalexin Diarrhea, Nausea Only, Other (See Comments)   Reaction:  Stomach pain    Erythromycin Diarrhea, Nausea Only, Other (See Comments)   Reaction:  Stomach pain    Hydromorphone Other (See Comments)   Reaction:  Hypotension    Iodinated Diagnostic Agents Rash, Other (See Comments)   Pt states that she is unable to have because she has chronic kidney disease.   Pt states that she is unable to have because she has chronic kidney disease. CKD CKD   Oxycodone Other (See Comments)   Reaction:  Stomach pain    Atorvastatin Nausea Only, Other (See Comments)   Reaction:  Weakness    Codeine  Diarrhea, Nausea Only, Other (See Comments)   Reaction:  Stomach pain Pt tolerates morphine    Doxycycline    Stomach pain    Tape Other (See Comments)   Reaction:  Causes pts skin to tear  Pt states that she is able to use paper tape.     Valdecoxib Swelling, Other (See Comments)   Pt states that her hands and feet swell.     Iodine Rash   Morphine And Related Rash      Medication List    TAKE these medications   acetaminophen 325 MG tablet Commonly known as:  TYLENOL Take 2 tablets (650 mg total) by mouth every 6 (six) hours as needed for mild pain (or Fever >/= 101).   allopurinol 100 MG tablet Commonly known as:  ZYLOPRIM Take 150 mg by mouth daily.   amiodarone 200 MG tablet Commonly known as:  PACERONE Take 1 tablet (200 mg total) by mouth 2 (two) times daily.   benzocaine-Menthol 20-0.5 % Aero Commonly known as:  DERMOPLAST Apply 1 application topically 4 (four) times daily as needed for irritation.   bisacodyl 10 MG suppository Commonly known as:  DULCOLAX Place 1 suppository (10 mg total) rectally daily as needed for moderate constipation.   busPIRone 10 MG tablet Commonly known as:  BUSPAR Take 10 mg by mouth 2 (two) times daily.   cholecalciferol 1000 units tablet Commonly known as:  VITAMIN D Take 1,000 Units by mouth daily.   colchicine 0.6 MG tablet Take 0.6 mg by mouth daily as needed (gout).   dibucaine 1 % Oint Commonly known as:  NUPERCAINAL Place 1 application rectally as needed for hemorrhoids.   dicyclomine 10 MG capsule Commonly known as:  BENTYL Take 1 capsule (10 mg total) by mouth 4 (four) times daily for 7 days.   docusate sodium 100 MG capsule Commonly known as:  COLACE Take 100 mg by mouth at bedtime.   ferrous sulfate 325 (65 FE) MG EC tablet Take 1 tablet (325 mg total) by mouth 2 (two) times daily.   furosemide 40 MG tablet Commonly known as:  LASIX Take 1 tablet (40 mg total) by mouth 2 (two) times daily.    HYDROcodone-acetaminophen 5-325 MG tablet Commonly known as:  NORCO/VICODIN Take 1 tablet by mouth every 6 (six) hours as  needed for moderate pain.   hydrocortisone 2.5 % cream Apply 1 application topically daily as needed (rash).   insulin NPH-regular Human (70-30) 100 UNIT/ML injection Commonly known as:  NOVOLIN 70/30 Inject 22 Units into the skin 2 (two) times daily.   ipratropium-albuterol 0.5-2.5 (3) MG/3ML Soln Commonly known as:  DUONEB Take 3 mLs by nebulization 2 (two) times daily.   lidocaine 5 % Commonly known as:  LIDODERM Place 1 patch onto the skin daily. Remove & Discard patch within 12 hours or as directed by MD   multivitamin with minerals Tabs tablet Take 1 tablet by mouth daily.   neomycin-bacitracin-polymyxin ointment Commonly known as:  NEOSPORIN Apply topically as needed for wound care.   NON FORMULARY Diet Type: Mechanical Soft Chopped   oxybutynin 5 MG tablet Commonly known as:  DITROPAN Take 10 mg by mouth 2 (two) times daily.   OXYGEN Inhale 2 L into the lungs continuous.   pantoprazole 40 MG tablet Commonly known as:  PROTONIX Take 1 tablet (40 mg total) by mouth 2 (two) times daily.   PARoxetine 40 MG tablet Commonly known as:  PAXIL Take 40 mg by mouth every morning.   senna-docusate 8.6-50 MG tablet Commonly known as:  Senokot-S Take 1 tablet by mouth every morning.   SYSTANE 0.4-0.3 % Soln Generic drug:  Polyethyl Glycol-Propyl Glycol Place 1 drop into both eyes 2 (two) times daily as needed.   traZODone 50 MG tablet Commonly known as:  DESYREL Take 50-100 mg by mouth at bedtime.   ZEASORB powder Generic drug:  talc Apply 1 application topically as needed (under the breast irritation).       Today  Evaluated today Still has abdominal pain Increased nausea and vomiting upon oral intake Discussed the plan of transfer to [facility] for endoscopic ultrasound  VITAL SIGNS:  Blood pressure (!) 97/35, pulse 71,  temperature 98.4 F (36.9 C), temperature source Oral, resp. rate 19, SpO2 99 %.  I/O:    Intake/Output Summary (Last 24 hours) at 06/22/2018 1356 Last data filed at 06/22/2018 1202 Gross per 24 hour  Intake 600 ml  Output -  Net 600 ml    PHYSICAL EXAMINATION:  Physical Exam  GENERAL:  77 y.o.-year-old patient lying in the bed with no acute distress.  LUNGS: Normal breath sounds bilaterally, no wheezing, rales,rhonchi or crepitation. No use of accessory muscles of respiration.  CARDIOVASCULAR: S1, S2 normal. No murmurs, rubs, or gallops.  ABDOMEN: Soft, tenderness right upper quadrant, non-distended. Bowel sounds present. No organomegaly or mass.  NEUROLOGIC: Moves all 4 extremities. PSYCHIATRIC: The patient is alert and oriented x 3.  SKIN: No obvious rash, lesion, or ulcer.   DATA REVIEW:   CBC Recent Labs  Lab 06/20/18 0333  WBC 3.5*  HGB 10.3*  HCT 32.0*  PLT 216    Chemistries  Recent Labs  Lab 06/20/18 0333  NA 138  K 4.3  CL 102  CO2 28  GLUCOSE 176*  BUN 9  CREATININE 1.15*  CALCIUM 8.7*  AST 33  ALT 20  ALKPHOS 164*  BILITOT 1.1    Cardiac Enzymes No results for input(s): TROPONINI in the last 168 hours.  Microbiology Results  Results for orders placed or performed during the hospital encounter of 06/14/18  Urine Culture     Status: Abnormal   Collection Time: 06/16/18 12:31 PM  Result Value Ref Range Status   Specimen Description   Final    URINE, RANDOM Performed at [facility]  [facility], [address], [place], [place] [address]    Special Requests   Final    cipro Normal Performed at [facility], [address] [place], [place] [address]    Culture MULTIPLE SPECIES PRESENT, SUGGEST RECOLLECTION (A)  Final   Report Status 06/18/2018 FINAL  Final    RADIOLOGY:  No results found.  Follow up with PCP in 1 week.  Management plans discussed with the patient, family and they are in agreement.  CODE STATUS: Full  code    Code Status Orders  (From admission, onward)         Start     Ordered   06/14/18 1509  Full code  Continuous     06/14/18 1509        Code Status History    Date Active Date Inactive Code Status Order ID Comments User Context   04/05/2018 0329 04/23/2018 1751 Full Code [id number]  Harrie Foreman, MD Inpatient   02/05/2018 0035 02/06/2018 2136 Full Code [id number]  Elwyn Reach, MD ED   08/28/2017 1121 08/30/2017 1422 Full Code [id number]  Deboraha Sprang, MD Inpatient   06/12/2017 0213 06/13/2017 1500 Full Code [id number]  Quintella Baton, MD Inpatient   08/25/2016 0940 08/25/2016 1416 Full Code [id number]  Corey Skains, MD Inpatient   01/25/2016 0622 01/28/2016 1800 Full Code [id number]  Saundra Shelling, MD ED   07/22/2015 0107 07/25/2015 1609 Full Code [id number]  Harrie Foreman, MD Inpatient   07/01/2015 1605 07/03/2015 1659 Full Code [id number]  Isaias Cowman, MD Inpatient   06/29/2015 1751 07/01/2015 1605 Full Code [id number]  Fritzi Mandes, MD Inpatient   02/20/2015 2322 02/25/2015 1753 Full Code [id number]  Marlyce Huge, MD ED      TOTAL TIME TAKING CARE OF THIS PATIENT ON DAY OF DISCHARGE: more than 33 minutes.   Saundra Shelling M.D on 06/22/2018 at 1:56 PM  Between 7am to 6pm - Pager - [phone]  After 6pm go to [email or web address] - password [email or web address] [facility]  Office  [phone]  CC: Primary care physician; [name], MD  Note: This dictation was prepared with Dragon dictation along with smaller phrase technology. Any transcriptional errors that result from this process are unintentional.

## 2018-06-22 NOTE — Progress Notes (Addendum)
Patient being transferred to [facility] for Endoscopic Ultrasound . VSS. Patient's spouse taking belongings Patient is being transferred with 22 gauge right forearm- SL. [facility] to transport. Dr Estanislado Pandy had just assessed patient before [facility]'s arrival.

## 2018-06-22 NOTE — Op Note (Signed)
[facility] Patient Name: Deborah Obrien Procedure Date : 06/22/2018 MRN: [id number] Attending MD: [name] , MD Date of Birth: [birth year] CSN: [id number] Age: 77 Admit Type: Inpatient Procedure:                Upper EUS Indications:              Bile duct dilated (remote cholecystectomy),                            intermittently elevated alk phos, RUQ abdominal pain Providers:                [name], MD, Cleda Daub, RN, Cherylynn Ridges, Technician Referring MD:             Jonathon Bellows, MD at [place]:                Monitored Anesthesia Care Complications:            No immediate complications. Estimated blood loss:                            None. Estimated Blood Loss:     Estimated blood loss: none. Procedure:                Pre-Anesthesia Assessment:                           - Prior to the procedure, a History and Physical                            was performed, and patient medications and                            allergies were reviewed. The patient's tolerance of                            previous anesthesia was also reviewed. The risks                            and benefits of the procedure and the sedation                            options and risks were discussed with the patient.                            All questions were answered, and informed consent                            was obtained. Prior Anticoagulants: The patient has                            taken Eliquis (apixaban), last dose was 1 day prior  to procedure. ASA Grade Assessment: IV - A patient                            with severe systemic disease that is a constant                            threat to life. After reviewing the risks and                            benefits, the patient was deemed in satisfactory                            condition to undergo the procedure.  After obtaining informed consent, the endoscope was                            passed under direct vision. Throughout the                            procedure, the patient's blood pressure, pulse, and                            oxygen saturations were monitored continuously. The                            [id number] ([id number]) Olympus Radial EUS was                            introduced through the mouth, and advanced to the                            second part of duodenum. The upper EUS was                            accomplished without difficulty. The patient                            tolerated the procedure well. Scope In: Scope Out: Findings:      ENDOSCOPIC FINDING: :      The examined esophagus was endoscopically normal.      There were multiple inflammed appearing polyps in the stomach. None       appeared neoplastic.      The examined duodenum was endoscopically normal including very good       visualization of the major papilla      ENDOSONOGRAPHIC FINDING: :      1. CBD was 40m, CHD was 180m No stones were noted in the extrahepatic       biliary tree.      2. Gallbladder surgically absent.      3. Pancreatic parenchyma was normal without masses or signs of chronic       pancreatitis.      4. Main pancreatic duct was nromal, non-dilated.      5. No peripancreatic adenopathy.      6. Limited views of the liver, spleen, portal and splenic vessels were  all normal. Impression:               - Normal appearance of extrahepatic biliary tree                            s/p remote cholecystectomy; no choledocholithiasis.                           - Multiple inflammed appearing polyps in the                            stomach. Recommendation:           - Discharge patient to home (ambulatory).                           - Follow up with Dr. Vicente Males as previously scheduled. Procedure Code(s):        --- Professional ---                           [phone],  Esophagogastroduodenoscopy, flexible,                            transoral; with endoscopic ultrasound examination                            limited to the esophagus, stomach or duodenum, and                            adjacent structures Diagnosis Code(s):        --- Professional ---                           R59.1, Generalized enlarged lymph nodes CPT copyright 2018 [employer] Association. All rights reserved. The codes documented in this report are preliminary and upon coder review may  be revised to meet current compliance requirements. [name], MD 06/22/2018 11:53:59 AM This report has been signed electronically. Number of Addenda: 0

## 2018-06-22 NOTE — Interval H&P Note (Signed)
History and Physical Interval Note:  06/22/2018 9:30 AM  Deborah Obrien  has presented today for surgery, with the diagnosis of bile duct stone  The various methods of treatment have been discussed with the patient and family. After consideration of risks, benefits and other options for treatment, the patient has consented to  Procedure(s): UPPER ENDOSCOPIC ULTRASOUND (EUS) RADIAL (N/A) as a surgical intervention .  The patient's history has been reviewed, patient examined, no change in status, stable for surgery.  I have reviewed the patient's chart and labs.  Questions were answered to the patient's satisfaction.     [name]

## 2018-06-22 NOTE — Progress Notes (Signed)
[facility] [place] NAME: Deborah Obrien    MR#:  [id number]  DATE OF BIRTH:  Aug 09, 1941  SUBJECTIVE:  Patient seen and evaluated today Has some abdominal pain  Abdominal pain gets worse on eating Unable to eat because of nausea and vomiting and abdominal discomfort Unable to eat a complete meal Transfer to [facility] today for endoscopic ultrasound   REVIEW OF SYSTEMS:   Review of Systems  Constitutional: Negative for chills, fever and weight loss.  HENT: Negative for ear discharge, ear pain and nosebleeds.   Eyes: Negative for blurred vision, pain and discharge.  Respiratory: Negative for sputum production, shortness of breath, wheezing and stridor.   Cardiovascular: Negative for chest pain, palpitations, orthopnea and PND.  Gastrointestinal: Positive for abdominal pain. Negative for diarrhea, nausea and vomiting.  Genitourinary: Negative for frequency and urgency.  Musculoskeletal: Positive for back pain. Negative for joint pain.  Neurological: Negative for sensory change, speech change, focal weakness and weakness.  Psychiatric/Behavioral: Negative for depression and hallucinations. The patient is not nervous/anxious.    Tolerating Diet:yes Tolerating PT: pending  DRUG ALLERGIES:   Allergies  Allergen Reactions  . Cephalosporins Other (See Comments)    Reaction:  Unknown   . Fish Allergy Shortness Of Breath  . Macrolides And Ketolides Other (See Comments)    Reaction:  Unknown  Other Reaction: Intolerance Other Reaction: Intolerance   . Meperidine Shortness Of Breath, Nausea Only and Other (See Comments)    Reaction:  Stomach pain   . Other Nausea Only, Rash and Other (See Comments)    Uncoded Allergy. Allergen: NON-STEROIDS, Other Reaction: Not Assessed bextra - hands and feet swell Shellfish - SOB Shellfish - SOB Other Reaction: Not Assessed  . Prednisone Anaphylaxis and Other (See Comments)   Reaction:  Increases pts blood sugar  Pt states that she is allergic to all steroids.    . Shellfish Allergy Anaphylaxis, Shortness Of Breath, Diarrhea, Nausea Only and Rash    Reaction:  Stomach pain  Other reaction(s): Other (See Comments) Reaction:Stomach pain   . Sulfa Antibiotics Shortness Of Breath, Diarrhea, Nausea Only and Other (See Comments)    Reaction:  Stomach pain  Reaction:Stomach pain  Other Reaction: Intolerance   . Sulfacetamide Sodium Diarrhea, Nausea Only, Other (See Comments) and Shortness Of Breath    Reaction:  Stomach pain   . Telbivudine     Other reaction(s): Other (See Comments) Reaction:Unknown   . Uloric [Febuxostat] Anaphylaxis    Locks pt's body up   . Aspirin Other (See Comments)    Reaction:  Stomach pain   . Celecoxib Other (See Comments)    Reaction:  GI bleed, weakness, and stomach pain.    . Cephalexin Diarrhea, Nausea Only and Other (See Comments)    Reaction:  Stomach pain   . Erythromycin Diarrhea, Nausea Only and Other (See Comments)    Reaction:  Stomach pain   . Hydromorphone Other (See Comments)    Reaction:  Hypotension   . Iodinated Diagnostic Agents Rash and Other (See Comments)    Pt states that she is unable to have because she has chronic kidney disease.   Pt states that she is unable to have because she has chronic kidney disease. CKD CKD  . Oxycodone Other (See Comments)    Reaction:  Stomach pain   . Atorvastatin Nausea Only and Other (See Comments)    Reaction:  Weakness   . Codeine Diarrhea, Nausea  Only and Other (See Comments)    Reaction:  Stomach pain Pt tolerates morphine   . Doxycycline     Stomach pain   . Tape Other (See Comments)    Reaction:  Causes pts skin to tear  Pt states that she is able to use paper tape.      . Valdecoxib Swelling and Other (See Comments)    Pt states that her hands and feet swell.    . Iodine Rash  . Morphine And Related Rash    VITALS:  Blood pressure 121/65, pulse  66, temperature 98.4 F (36.9 C), temperature source Oral, resp. rate 14, height 5\' 4"  (1.626 m), weight 104.3 kg, SpO2 98 %.  PHYSICAL EXAMINATION:   Physical Exam  GENERAL:  77 y.o.-year-old patient lying in the bed with no acute distress.  EYES: Pupils equal, round, reactive to light and accommodation. No scleral icterus. Extraocular muscles intact.  HEENT: Head atraumatic, normocephalic. Oropharynx and nasopharynx clear.  NECK:  Supple, no jugular venous distention. No thyroid enlargement, no tenderness.  LUNGS: Normal breath sounds bilaterally, no wheezing, rales, rhonchi. No use of accessory muscles of respiration.  CARDIOVASCULAR: S1, S2 normal. No murmurs, rubs, or gallops.  ABDOMEN: Soft, epigastric and right upper quadrant tenderness nondistended. Bowel sounds present.  EXTREMITIES: No cyanosis, clubbing or edema b/l.    NEUROLOGIC: Cranial nerves II through XII are intact. No focal Motor or sensory deficits b/l.   PSYCHIATRIC:  patient is alert and oriented x 3.  SKIN: No obvious rash, lesion, or ulcer.   LABORATORY PANEL:  CBC Recent Labs  Lab 06/20/18 0333  WBC 3.5*  HGB 10.3*  HCT 32.0*  PLT 216    Chemistries  Recent Labs  Lab 06/20/18 0333  NA 138  K 4.3  CL 102  CO2 28  GLUCOSE 176*  BUN 9  CREATININE 1.15*  CALCIUM 8.7*  AST 33  ALT 20  ALKPHOS 164*  BILITOT 1.1   Cardiac Enzymes No results for input(s): TROPONINI in the last 168 hours. RADIOLOGY:  No results found. ASSESSMENT AND PLAN:  Deborah Obrien  is a 77 y.o. female with a known history of diabetes, hypertension, recent upper GI bleed with gastric polyp, anxiety, recent kyphoplasty presents to the emergency room with 1 week of worsening epigastric and right upper quadrant pain.  The patient went to her GI physician Dr. Georgeann Oppenheim office who referred her to the emergency room.  A CT scan of the abdomen and pelvis has been checked and is normal.   -Abdominal pain with persistent nausea and  vomiting Hydrate with IV fluids HIDA scan is normal Transfer to [facility] today for endoscopic ultrasound by Dr. Owens Loffler N.p.o.  Hold Eliquis  -Acute cystitis  Urine culture with multiple species.  Antibiotics discontinued  -Constipation Stool softeners   -Acute kidney injury over CKD stage III.  Improved with IV fluids Monitor renal function  -chronic low back pain-Patient has palpable pain from her right back coming to the right upper quadrant appears more musculoskeletal strain from her recent fall and vertebral fracture however cannot rule out G.I. issue as well.  -Diabetes mellitus.  Continue home insulin at lower dose.  Sliding scale insulin added  -Paroxysmal atrial fibrillation.  Eliquis on hold for procedure tomorrow  Case discussed with Care Management/Social Worker. Management plans discussed with the patient, husband at bedside and they are in agreement.  RN Eliezer Lofts and kim were  present during the conversation  CODE STATUS: full  DVT Prophylaxis: lovenox  TOTAL TIME TAKING CARE OF THIS PATIENT: 32 minutes.  >50% time spent on counselling and coordination of care  POSSIBLE D/C IN 1-2 DAYS, DEPENDING ON CLINICAL CONDITION.  Note: This dictation was prepared with Dragon dictation along with smaller phrase technology. Any transcriptional errors that result from this process are unintentional.  Saundra Shelling M.D on 06/22/2018 at 8:06 AM  Between 7am to 6pm - Pager - [phone]  After 6pm go to [email or web address] - password [email or web address] [facility]  Office  [phone]  CC: Primary care physician; [name], MDPatient ID: Deborah Obrien, female   DOB: 1940/12/10, 77 y.o.   MRN: [id number]

## 2018-06-22 NOTE — Anesthesia Preprocedure Evaluation (Addendum)
Anesthesia Evaluation  Patient identified by MRN, date of birth, ID band Patient awake    Reviewed: Allergy & Precautions, NPO status , Patient's Chart, lab work & pertinent test results  Airway Mallampati: II  TM Distance: >3 FB Neck ROM: Full    Dental  (+) Upper Dentures, Dental Advisory Given   Pulmonary shortness of breath and with exertion,     + decreased breath sounds      Cardiovascular Exercise Tolerance: Poor hypertension, Pt. on medications + angina + CAD, + Peripheral Vascular Disease and +CHF  (-) pacemaker Rhythm:Irregular Rate:Tachycardia     Neuro/Psych PSYCHIATRIC DISORDERS Anxiety Depression TIA Neuromuscular disease    GI/Hepatic GERD  Medicated,  Endo/Other  diabetes, Type 1, Insulin DependentMorbid obesity  Renal/GU CRFRenal disease     Musculoskeletal  (+) Arthritis ,   Abdominal (+) + obese,   Peds  Hematology   Anesthesia Other Findings   Reproductive/Obstetrics                            Anesthesia Physical  Anesthesia Plan  ASA: III  Anesthesia Plan: MAC   Post-op Pain Management:    Induction: Intravenous  PONV Risk Score and Plan: 2 and Treatment may vary due to age or medical condition  Airway Management Planned: Nasal Cannula  Additional Equipment:   Intra-op Plan:   Post-operative Plan:   Informed Consent: I have reviewed the patients History and Physical, chart, labs and discussed the procedure including the risks, benefits and alternatives for the proposed anesthesia with the patient or authorized representative who has indicated his/her understanding and acceptance.   Dental advisory given  Plan Discussed with: CRNA, Anesthesiologist and Surgeon  Anesthesia Plan Comments:        Anesthesia Quick Evaluation

## 2018-06-22 NOTE — Discharge Instructions (Signed)
YOU HAD AN ENDOSCOPIC PROCEDURE TODAY: Refer to the procedure report and other information in the discharge instructions given to you for any specific questions about what was found during the examination. If this information does not answer your questions, please call [place] office at [phone] to clarify.   YOU SHOULD EXPECT: Some feelings of bloating in the abdomen. Passage of more gas than usual. Walking can help get rid of the air that was put into your GI tract during the procedure and reduce the bloating. If you had a lower endoscopy (such as a colonoscopy or flexible sigmoidoscopy) you may notice spotting of blood in your stool or on the toilet paper. Some abdominal soreness may be present for a day or two, also.  DIET: Your first meal following the procedure should be a light meal and then it is ok to progress to your normal diet. A half-sandwich or bowl of soup is an example of a good first meal. Heavy or fried foods are harder to digest and may make you feel nauseous or bloated. Drink plenty of fluids but you should avoid alcoholic beverages for 24 hours. If you had a esophageal dilation, please see attached instructions for diet.    ACTIVITY: Your care partner should take you home directly after the procedure. You should plan to take it easy, moving slowly for the rest of the day. You can resume normal activity the day after the procedure however YOU SHOULD NOT DRIVE, use power tools, machinery or perform tasks that involve climbing or major physical exertion for 24 hours (because of the sedation medicines used during the test).   SYMPTOMS TO REPORT IMMEDIATELY: A gastroenterologist can be reached at any hour. Please call [phone]  for any of the following symptoms:   Following upper endoscopy (EGD, EUS, ERCP, esophageal dilation) Vomiting of blood or coffee ground material  New, significant abdominal pain  New, significant chest pain or pain under the shoulder blades  Painful or  persistently difficult swallowing  New shortness of breath  Black, tarry-looking or red, bloody stools  FOLLOW UP:  If any biopsies were taken you will be contacted by phone or by letter within the next 1-3 weeks. Call [phone]  if you have not heard about the biopsies in 3 weeks.  Please also call with any specific questions about appointments or follow up tests.  

## 2018-06-22 NOTE — Transfer of Care (Signed)
Immediate Anesthesia Transfer of Care Note  Patient: Deborah Obrien  Procedure(s) Performed: UPPER ENDOSCOPIC ULTRASOUND (EUS) RADIAL (N/A )  Patient Location: Endoscopy Unit  Anesthesia Type:MAC  Level of Consciousness: awake and patient cooperative  Airway & Oxygen Therapy: Patient Spontanous Breathing and Patient connected to nasal cannula oxygen  Post-op Assessment: Report given to RN, Post -op Vital signs reviewed and stable and Patient moving all extremities X 4  Post vital signs: Reviewed and stable  Last Vitals:  Vitals Value Taken Time  BP 134/50 06/22/2018 12:00 PM  Temp 36.9 C 06/22/2018 12:00 PM  Pulse 64 06/22/2018 12:06 PM  Resp 17 06/22/2018 12:06 PM  SpO2 97 % 06/22/2018 12:06 PM  Vitals shown include unvalidated device data.  Last Pain:  Vitals:   06/22/18 1200  TempSrc: Oral  PainSc: 3       Patients Stated Pain Goal: 2 (27/06/23 7628)  Complications: No apparent anesthesia complications and Pt states that arm feels much better at the IV infiltration site, Resting comfortably, VSS

## 2018-06-22 NOTE — Anesthesia Postprocedure Evaluation (Signed)
Anesthesia Post Note  Patient: Deborah Obrien  Procedure(s) Performed: UPPER ENDOSCOPIC ULTRASOUND (EUS) RADIAL (N/A )     Patient location during evaluation: Endoscopy Anesthesia Type: MAC Level of consciousness: awake and alert Pain management: pain level controlled Vital Signs Assessment: post-procedure vital signs reviewed and stable Respiratory status: spontaneous breathing, nonlabored ventilation and respiratory function stable Cardiovascular status: stable and blood pressure returned to baseline Postop Assessment: no apparent nausea or vomiting Anesthetic complications: no    Last Vitals:  Vitals:   06/22/18 0935 06/22/18 1200  BP: (!) 153/62 (!) 134/50  Pulse: 70 66  Resp: 13 17  Temp: 36.6 C 36.9 C  SpO2: 98% 99%    Last Pain:  Vitals:   06/22/18 1200  TempSrc: Oral  PainSc: [place]

## 2018-06-22 NOTE — Anesthesia Procedure Notes (Signed)
Procedure Name: MAC Date/Time: 06/22/2018 10:54 AM Performed by: Carney Living, CRNA Pre-anesthesia Checklist: Patient identified, Emergency Drugs available, Suction available, Patient being monitored and Timeout performed Patient Re-evaluated:Patient Re-evaluated prior to induction Oxygen Delivery Method: Nasal cannula

## 2018-06-23 ENCOUNTER — Encounter (HOSPITAL_COMMUNITY): Payer: Self-pay | Admitting: Gastroenterology

## 2018-06-26 ENCOUNTER — Encounter: Payer: Self-pay | Admitting: Gastroenterology

## 2018-06-26 ENCOUNTER — Ambulatory Visit: Admit: 2018-06-26 | Payer: Medicare Other | Admitting: Gastroenterology

## 2018-06-26 ENCOUNTER — Other Ambulatory Visit: Payer: Self-pay

## 2018-06-26 ENCOUNTER — Ambulatory Visit (INDEPENDENT_AMBULATORY_CARE_PROVIDER_SITE_OTHER): Payer: Medicare Other | Admitting: Gastroenterology

## 2018-06-26 VITALS — BP 116/59 | HR 67 | Ht 64.0 in | Wt 225.4 lb

## 2018-06-26 DIAGNOSIS — K317 Polyp of stomach and duodenum: Secondary | ICD-10-CM

## 2018-06-26 DIAGNOSIS — I209 Angina pectoris, unspecified: Secondary | ICD-10-CM

## 2018-06-26 SURGERY — ESOPHAGOGASTRODUODENOSCOPY (EGD) WITH PROPOFOL
Anesthesia: General

## 2018-06-26 NOTE — Progress Notes (Signed)
Jonathon Bellows MD, MRCP(U.K) [address]  [place]  [place], [place] [address]  Main: [phone]  Fax: [phone]   Primary Care Physician: [name], MD  Primary Gastroenterologist:  Dr. Jonathon Bellows   No chief complaint on file.   HPI: Deborah Obrien is a 77 y.o. female   Summary of history :  Deborah Obrien a 77 y.o.y/o femaleadmitted a few months back in the hospital with a severe upper GI bleed. I performed an upper endoscopy and noted a large gastric polyp that was bleeding. At that point of time I sprayed hemospray and obtained hemostasis. She is on Eliquis for atrial fibrillation and she has a history of strokes. She has been presently admitted with abdominal pain worse on eating.   At her last office visit due to severe RUQ abdominal ;pain - was admitted , she is status post cholecystectomy.  LFT's normal except elevated alk phos. She underwent an USG RUQ which showed a mildly dilated CBD 9 mm, she underwent an EUS at [facility] after and found no stones in her CBD and was discharged. 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  Ct abdomen 06/14/18 showed no acute findings - atrophic pancreatitis. Features of prior kyphoplasty at t12- L1  Today she is here for follow up .    Since discharge she says that the abdominal pain has much resolved, carafate helps. Thinks her back may be an issue causing the pain. Able to eat   Current Outpatient Medications  Medication Sig Dispense Refill  . acetaminophen (TYLENOL) 325 MG tablet Take 2 tablets (650 mg total) by mouth every 6 (six) hours as needed for mild pain (or Fever >/= 101).    Marland Kitchen allopurinol (ZYLOPRIM) 100 MG tablet Take 150 mg by mouth daily.     Marland Kitchen amiodarone (PACERONE) 200 MG tablet Take 1 tablet (200 mg total) by mouth 2 (two) times daily. 60 tablet 3  . benzocaine-Menthol (DERMOPLAST) 20-0.5 % AERO Apply 1 application topically 4 (four) times daily as needed for  irritation.    . bisacodyl (DULCOLAX) 10 MG suppository Place 1 suppository (10 mg total) rectally daily as needed for moderate constipation. 12 suppository 0  . busPIRone (BUSPAR) 10 MG tablet Take 10 mg by mouth 2 (two) times daily.     . cholecalciferol (VITAMIN D) 1000 units tablet Take 1,000 Units by mouth daily.    . colchicine 0.6 MG tablet Take 0.6 mg by mouth daily as needed (gout).     . dibucaine (NUPERCAINAL) 1 % OINT Place 1 application rectally as needed for hemorrhoids.  0  . dicyclomine (BENTYL) 10 MG capsule Take 1 capsule (10 mg total) by mouth 4 (four) times daily for 7 days. 28 capsule 0  . docusate sodium (COLACE) 100 MG capsule Take 100 mg by mouth at bedtime.     . ferrous sulfate 325 (65 FE) MG EC tablet Take 1 tablet (325 mg total) by mouth 2 (two) times daily. 60 tablet 3  . furosemide (LASIX) 40 MG tablet Take 1 tablet (40 mg total) by mouth 2 (two) times daily. 30 tablet   . HYDROcodone-acetaminophen (NORCO/VICODIN) 5-325 MG tablet Take 1 tablet by mouth every 6 (six) hours as needed for moderate pain. 120 tablet 0  . hydrocortisone 2.5 % cream Apply 1 application topically daily as needed (rash).    . insulin NPH-regular Human (NOVOLIN 70/30) (70-30) 100 UNIT/ML injection Inject 22 Units  into the skin 2 (two) times daily.     Marland Kitchen ipratropium-albuterol (DUONEB) 0.5-2.5 (3) MG/3ML SOLN Take 3 mLs by nebulization 2 (two) times daily. 360 mL   . lidocaine (LIDODERM) 5 % Place 1 patch onto the skin daily. Remove & Discard patch within 12 hours or as directed by MD (Patient not taking: Reported on 06/15/2018) 30 patch 0  . Multiple Vitamin (MULTIVITAMIN WITH MINERALS) TABS tablet Take 1 tablet by mouth daily.    Marland Kitchen neomycin-bacitracin-polymyxin (NEOSPORIN) ointment Apply topically as needed for wound care. 15 g 0  . NON FORMULARY Diet Type: Mechanical Soft Chopped    . oxybutynin (DITROPAN) 5 MG tablet Take 10 mg by mouth 2 (two) times daily.    . OXYGEN Inhale 2 L into the  lungs continuous.    . pantoprazole (PROTONIX) 40 MG tablet Take 1 tablet (40 mg total) by mouth 2 (two) times daily. 60 tablet 0  . PARoxetine (PAXIL) 40 MG tablet Take 40 mg by mouth every morning.    Vladimir Faster Glycol-Propyl Glycol (SYSTANE) 0.4-0.3 % SOLN Place 1 drop into both eyes 2 (two) times daily as needed.    . senna-docusate (SENOKOT-S) 8.6-50 MG tablet Take 1 tablet by mouth every morning.    . talc (ZEASORB) powder Apply 1 application topically as needed (under the breast irritation).    . traZODone (DESYREL) 50 MG tablet Take 50-100 mg by mouth at bedtime.      No current facility-administered medications for this visit.     Allergies as of 06/26/2018 - Review Complete 06/22/2018  Allergen Reaction Noted  . Cephalosporins Other (See Comments) 01/19/2015  . Fish allergy Shortness Of Breath 11/26/2015  . Macrolides and ketolides Other (See Comments) 01/12/2015  . Meperidine Shortness Of Breath, Nausea Only, and Other (See Comments) 01/19/2015  . Other Nausea Only, Rash, and Other (See Comments) 01/12/2015  . Prednisone Anaphylaxis and Other (See Comments) 01/19/2015  . Shellfish allergy Anaphylaxis, Shortness Of Breath, Diarrhea, Nausea Only, and Rash 06/29/2015  . Sulfa antibiotics Shortness Of Breath, Diarrhea, Nausea Only, and Other (See Comments) 06/25/2013  . Sulfacetamide sodium Diarrhea, Nausea Only, Other (See Comments), and Shortness Of Breath 08/12/2015  . Telbivudine  08/12/2015  . Uloric [febuxostat] Anaphylaxis 08/22/2016  . Aspirin Other (See Comments) 01/19/2015  . Celecoxib Other (See Comments) 01/19/2015  . Cephalexin Diarrhea, Nausea Only, and Other (See Comments) 01/19/2015  . Erythromycin Diarrhea, Nausea Only, and Other (See Comments) 01/19/2015  . Hydromorphone Other (See Comments) 01/19/2015  . Iodinated diagnostic agents Rash and Other (See Comments) 01/19/2015  . Oxycodone Other (See Comments) 01/19/2015  . Atorvastatin Nausea Only and Other (See  Comments) 01/19/2015  . Codeine Diarrhea, Nausea Only, and Other (See Comments) 01/19/2015  . Doxycycline  08/24/2017  . Tape Other (See Comments) 06/29/2015  . Valdecoxib Swelling and Other (See Comments) 01/19/2015  . Iodine Rash 06/29/2015  . Morphine and related Rash 06/14/2018    ROS:  General: Negative for anorexia, weight loss, fever, chills, fatigue, weakness. ENT: Negative for hoarseness, difficulty swallowing , nasal congestion. CV: Negative for chest pain, angina, palpitations, dyspnea on exertion, peripheral edema.  Respiratory: Negative for dyspnea at rest, dyspnea on exertion, cough, sputum, wheezing.  GI: See history of present illness. GU:  Negative for dysuria, hematuria, urinary incontinence, urinary frequency, nocturnal urination.  Endo: Negative for unusual weight change.    Physical Examination:   There were no vitals taken for this visit.  General: Well-nourished, well-developed in no acute distress.  Eyes: No icterus. Conjunctivae pink. Mouth: Oropharyngeal mucosa moist and pink , no lesions erythema or exudate. Lungs: Clear to auscultation bilaterally. Non-labored. Heart: Regular rate and rhythm, no murmurs rubs or gallops.  Abdomen: Bowel sounds are normal, nontender, nondistended, no hepatosplenomegaly or masses, no abdominal bruits or hernia , no rebound or guarding.   Extremities: No lower extremity edema. No clubbing or deformities.Tednerness over rt lumbar paraspinal muscles.  Neuro: Alert and oriented x 3.  Grossly intact. Skin: Warm and dry, no jaundice.   Psych: Alert and cooperative, normal mood and affect.   Imaging Studies: Ct Abdomen Pelvis Wo Contrast  Result Date: 06/14/2018 CLINICAL DATA:  Abdominal pain with nausea and EXAM: CT ABDOMEN AND PELVIS WITHOUT CONTRAST TECHNIQUE: Multidetector CT imaging of the abdomen and pelvis was performed following the standard protocol without oral or IV contrast. COMPARISON:  April 04, 2018 and Jan 25, 2016 FINDINGS: Lower chest: There is atelectatic change in the left base. No lung base edema or consolidation. Pacemaker leads are attached to the right atrium and right ventricle. Hepatobiliary: No focal liver lesions are appreciable on this noncontrast enhanced study except for a small calcification in the inferior posterior right lobe of the liver, a probable tiny granuloma. Gallbladder is absent. There is no appreciable biliary duct dilatation. Pancreas: Pancreas is atrophic. No pancreatic mass or inflammatory focus evident. Spleen: No splenic lesions are evident. A small accessory spleen is seen medial to the spleen. Adrenals/Urinary Tract: Adrenals appear normal bilaterally. Kidneys bilaterally show no evident renal mass or hydronephrosis on either side. There is an extrarenal pelvis on the right, an anatomic variant. There is no evident renal or ureteral calculus on either side. Urinary bladder is midline with wall thickness within normal limits. Stomach/Bowel: There is moderate stool in the colon. There is no bowel wall or mesenteric thickening. There is no evident bowel obstruction. No free air or portal venous air. Surgical clips are again noted in the pelvic region, stable. Vascular/Lymphatic: There is aortoiliac atherosclerosis. No aneurysm evident. Major mesenteric arterial vessels appear patent on this noncontrast enhanced study. No adenopathy is evident in the abdomen or pelvis. Reproductive: Uterus is absent.  No evident pelvic mass. Other: Appendix absent. No periappendiceal region inflammatory change evident. There is no abscess or ascites evident in the abdomen or pelvis. Musculoskeletal: Patient has had previous kyphoplasty procedures at T12 and L1. There is postoperative screw and plate fixation at L4, L5, and S1. there are no blastic or lytic bone lesions. There is no intramuscular or abdominal wall lesion. IMPRESSION: 1. No evident bowel obstruction. No abscess in the abdomen or pelvis.  Postoperative clips noted in the pelvis. Appendix absent. 2. No renal or ureteral calculus on either side. No hydronephrosis. 3.  Atrophic pancreas.  No focal pancreatic lesions. 4.  Gallbladder absent.  Uterus absent. 5. Multilevel postoperative change in the lumbar spine. Status post kyphoplasty procedures at T12 and L1. 6.  Aortoiliac atherosclerosis. Aortic Atherosclerosis (ICD10-I70.0). Electronically Signed   By: Lowella Grip III M.D.   On: 06/14/2018 13:39   Ct Thoracic Spine Wo Contrast  Result Date: 06/04/2018 CLINICAL DATA:  Thoracic and low back pain since 04/15/2018. Difficulty walking and standing. Reported fracture on office radiographs. Prior back surgeries. EXAM: CT THORACIC SPINE WITHOUT CONTRAST TECHNIQUE: Multidetector CT images of the thoracic were obtained using the standard protocol without intravenous contrast. COMPARISON:  CT abdomen and pelvis 04/04/2018 FINDINGS: Alignment: 2 mm anterolisthesis of C7 on T1. Vertebrae: Unhealed T12 superior endplate compression  fracture with 25% height loss and 3 mm retropulsion of the superior endplate, new from the prior abdominal CT. No suspicious osseous lesion. Solid C6-7 ACDF. Paraspinal and other soft tissues: Cardiomegaly with coronary artery atherosclerosis. 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 Thoracic aortic atherosclerosis without aneurysm. Disc levels: Anterior vertebral osteophyte formation and disc space narrowing from T6-7 to T9-10. No osseous spinal canal stenosis. Scattered mild-to-moderate facet arthrosis with up to mild osseous neural foraminal stenosis. IMPRESSION: 1. Unhealed T12 compression fracture with 25% height loss. 2. 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 3.  Aortic Atherosclerosis (ICD10-I70.0). Electronically Signed   By: Logan Bores M.D.   On: 06/04/2018 14:45   Ct Lumbar Spine Wo Contrast  Result Date: 06/04/2018 CLINICAL DATA:  Thoracic and low back pain since 04/15/2018.  Difficulty walking and standing. Reported fracture on office radiographs. Prior back surgeries. EXAM: CT LUMBAR SPINE WITHOUT CONTRAST TECHNIQUE: Multidetector CT imaging of the lumbar spine was performed without intravenous contrast administration. Multiplanar CT image reconstructions were also generated. COMPARISON:  CT abdomen and pelvis 04/04/2018. Lumbar spine MRI 09/30/2014. FINDINGS: Segmentation: 5 lumbar type vertebrae. Alignment: Slight lumbar levoscoliosis. No listhesis. Vertebrae: New L1 compression fracture involving the superior and inferior endplates with 08% height loss centrally and no retropulsion. Mild patchy sclerosis in the L1 vertebral body may reflect a subacute time course, however the fracture line remains clearly visible without a discrete osseous lesion identified at this level or elsewhere in the lumbar spine to strongly suggest a pathologic fracture. L4-S1 posterior fusion without evidence of pedicle screw loosening and likely with posterior element osseous fusion at both levels. Severe disc space narrowing at L4-5 greater than L5-S1 with degenerative interbody ankylosis at L4-5 and possibly L5-S1. Paraspinal and other soft tissues: Abdominal aortic atherosclerosis without aneurysm. Cholecystectomy clips. Disc levels: L1-2: Mild disc bulging and mild-to-moderate facet and ligamentum flavum hypertrophy, slightly progressed from the prior MRI but resulting in at most mild spinal stenosis. L2-3: Disc bulging and moderate facet and ligamentum flavum hypertrophy result in mild-to-moderate spinal stenosis and mild bilateral neural foraminal stenosis, mildly progressed from the prior MRI. L3-4: Disc bulging and moderate facet and ligamentum flavum hypertrophy result in mild-to-moderate spinal stenosis without significant neural foraminal stenosis, unchanged. L4-5: Prior posterior decompression and fusion. No stenosis. L5-S1: Prior posterior decompression and fusion. Unchanged mild left neural  foraminal stenosis due to spurring. IMPRESSION: 1. Unhealed L1 compression fracture with 50% height 2. 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 3. L4-S1 fusion. 4.  Aortic Atherosclerosis (ICD10-I70.0). Electronically Signed   By: Logan Bores M.D.   On: 06/04/2018 15:03   Nm Hepatobiliary Liver Func  Result Date: 06/19/2018 CLINICAL DATA:  Right upper quadrant pain with nausea and vomiting EXAM: NUCLEAR MEDICINE HEPATOBILIARY IMAGING TECHNIQUE: Sequential images of the abdomen were obtained out to 60 minutes following intravenous administration of radiopharmaceutical. RADIOPHARMACEUTICALS:  5.58 mCi Tc-25m Choletec IV COMPARISON:  June 17, 2018 ultrasound right upper quadrant FINDINGS: Liver uptake of radiotracer is normal. There is prompt visualization of small bowel, indicating patency of the common bile duct. Gallbladder is noted to be absent. No ectopic radiotracer. IMPRESSION: There is patency of the common bile duct, indicated by prompt visualization of small bowel. Gallbladder absent. No biliary leak evident. Electronically Signed   By: WLowella GripIII M.D.   On: 06/19/2018 13:36   Dg Abd Acute W/chest  Result Date: 06/16/2018 CLINICAL DATA:  Right lower quadrant abdominal pain for the past 3  days. EXAM: DG ABDOMEN ACUTE W/ 1V CHEST COMPARISON:  Abdomen pelvis CT dated 06/14/2018 and portable chest dated 04/16/2018. FINDINGS: Normal sized heart. Stable left subclavian pacemaker leads. Small amount of linear density at the left lung base with significantly improved aeration of both lungs. Clear right lung. Minimal bilateral pleural fluid, significantly improved. Normal bowel gas pattern without free peritoneal air. Bilateral pelvic surgical clips. Cholecystectomy clips. Lumbosacral spine fixation hardware. T12 and L1 vertebral compression fractures and kyphoplasty material. IMPRESSION: 1. Resolved changes of congestive heart failure with the exception of minimal  bilateral residual pleural fluid. 2. Small amount of linear atelectasis or scarring at the left lung base. 3. No acute abdominal abnormality. Electronically Signed   By: Claudie Revering M.D.   On: 06/16/2018 12:33   US Abdomen Limited Ruq  Result Date: 06/17/2018 CLINICAL DATA:  Abnormal liver function tests. History of cholecystectomy. EXAM: ULTRASOUND ABDOMEN LIMITED RIGHT UPPER QUADRANT COMPARISON:  06/14/2018 CT abdomen/pelvis. FINDINGS: Gallbladder: Surgically absent. Common bile duct: Diameter: 9 mm Liver: No focal lesion identified. Within normal limits in parenchymal echogenicity. Portal vein is patent on color Doppler imaging with normal direction of blood flow towards the liver. IMPRESSION: 1. Bile duct are within normal post cholecystectomy limits with CBD diameter 9 mm. 2. Normal liver. Electronically Signed   By: Ilona Sorrel M.D.   On: 06/17/2018 09:54    Assessment and Plan:   MATISSE ROSKELLEY is a 77 y.o. y/o female here to follow up for gastric polyp which previously caused severe gi bleed requiring resection , on Eloquis. Acute RUQ pain last few weeks. Differentials for RUQ Pain include SOD type 1 vs related to her back. At this time am more inclined to believe its a pain related to her back.    Plan  1. EGD with resection of gastric polyps - Eloquis holding instructions  I have discussed alternative options, risks & benefits,  which include, but are not limited to, bleeding, infection, perforation,respiratory complication & drug reaction.  The patient agrees with this plan & written consent will be obtained.   .   Dr Jonathon Bellows  MD,MRCP [facility]) Follow up in 4 weeks

## 2018-06-27 ENCOUNTER — Ambulatory Visit: Payer: Medicare Other | Attending: Orthopedic Surgery | Admitting: Physical Therapy

## 2018-06-27 ENCOUNTER — Telehealth: Payer: Self-pay | Admitting: Physical Therapy

## 2018-06-27 ENCOUNTER — Ambulatory Visit: Payer: Medicare Other | Admitting: Physical Therapy

## 2018-06-27 ENCOUNTER — Telehealth: Payer: Self-pay | Admitting: Internal Medicine

## 2018-06-27 DIAGNOSIS — M545 Low back pain, unspecified: Secondary | ICD-10-CM

## 2018-06-27 DIAGNOSIS — M6281 Muscle weakness (generalized): Secondary | ICD-10-CM | POA: Insufficient documentation

## 2018-06-27 DIAGNOSIS — R262 Difficulty in walking, not elsewhere classified: Secondary | ICD-10-CM | POA: Insufficient documentation

## 2018-06-27 DIAGNOSIS — G8929 Other chronic pain: Secondary | ICD-10-CM | POA: Insufficient documentation

## 2018-06-27 NOTE — Telephone Encounter (Signed)
Patient arrived earlier today for her initial eval. Upon arrival it was determined that she had been hospitalized and discharged since her PT referral was signed. The referral also requested the PT follow a protocol, but no protocol was provided. [name] left VM with [facility] to request protocol. [facility] faxed the original order again. I called back and left a message explaining the situation and requesting a new order and protocol if appropriate when the patient comes to her follow up appointment at [facility] on Friday 06/29/2018.

## 2018-06-27 NOTE — Therapy (Signed)
[facility] [address] [place], [address] Phone: [phone]   Fax:  [phone]  Physical Therapy Evaluation  Patient Details  Name: Deborah Obrien MRN: [id number] Date of Birth: [birth year] No data recorded  Encounter Date: 06/27/2018    Past Medical History:  Diagnosis Date  . A-fib ([place]) 2016  . Acid reflux   . Anxiety   . Bowel obstruction ([place])   . CHF (congestive heart failure) ([place]) 2017  . Chronic kidney disease (CKD) stage G3a/A1, moderately decreased glomerular filtration rate (GFR) between 45-59 mL/min/1.73 square meter and albuminuria creatinine ratio less than 30 mg/g (HCC)   . Diabetes mellitus without complication ([place])   . Dyspnea   . Fatty liver   . GI bleed   . Gout   . Hypertension   . Morbid obesity ([place])   . Osteoarthritis   . Osteoporosis   . Paroxysmal atrial fibrillation (HCC)   . Presence of permanent cardiac pacemaker   . Sinus node dysfunction ([place]) 08/24/2017  . TIA (transient ischemic attack)     Past Surgical History:  Procedure Laterality Date  . ABDOMINAL HYSTERECTOMY  1980  . APPENDECTOMY    . BACK SURGERY     2008  . BREAST BIOPSY Right 08/16   stereo fibroadenomatous change, neg for atypia  . BREAST EXCISIONAL BIOPSY Left    neg  . CARDIAC CATHETERIZATION Left 08/25/2016   Procedure: Left Heart Cath and Coronary Angiography;  Surgeon: Corey Skains, MD;  Location: [facility];  Service: Cardiovascular;  Laterality: Left;  . CARDIOVERSION N/A 06/28/2017   Procedure: CARDIOVERSION;  Surgeon: Corey Skains, MD;  Location: [facility];  Service: Cardiovascular;  Laterality: N/A;  . CARDIOVERSION N/A 02/06/2018   Procedure: CARDIOVERSION;  Surgeon: Josue Hector, MD;  Location: [facility];  Service: Cardiovascular;  Laterality: N/A;  . CATARACT EXTRACTION, BILATERAL  2012  . CHOLECYSTECTOMY    . colon blockage  1999  . COLON SURGERY   1999  . COLONOSCOPY  2016   polyps removed 2016  . DORSAL COMPARTMENT RELEASE Left 09/14/2016   Procedure: RELEASE DORSAL COMPARTMENT (DEQUERVAIN);  Surgeon: Dereck Leep, MD;  Location: [facility];  Service: Orthopedics;  Laterality: Left;  . ESOPHAGOGASTRODUODENOSCOPY N/A 01/27/2015   Procedure: ESOPHAGOGASTRODUODENOSCOPY (EGD);  Surgeon: Lollie Sails, MD;  Location[facility];  Service: Endoscopy;  Laterality: N/A;  . ESOPHAGOGASTRODUODENOSCOPY (EGD) WITH PROPOFOL N/A 07/24/2015   Procedure: ESOPHAGOGASTRODUODENOSCOPY (EGD) WITH PROPOFOL;  Surgeon: Lucilla Lame, MD;  Location: [facility];  Service: Endoscopy;  Laterality: N/A;  . ESOPHAGOGASTRODUODENOSCOPY (EGD) WITH PROPOFOL N/A 04/12/2018   Procedure: ESOPHAGOGASTRODUODENOSCOPY (EGD) WITH PROPOFOL;  Surgeon: Jonathon Bellows, MD;  Location: [facility] ENDOSCOPY;  Service: Gastroenterology;  Laterality: N/A;  . ESOPHAGOGASTRODUODENOSCOPY (EGD) WITH PROPOFOL N/A 06/22/2018   Procedure: ESOPHAGOGASTRODUODENOSCOPY (EGD) WITH PROPOFOL;  Surgeon: [name], MD;  Location: [facility] ENDOSCOPY;  Service: Endoscopy;  Laterality: N/A;  . EUS N/A 06/22/2018   Procedure: UPPER ENDOSCOPIC ULTRASOUND (EUS) RADIAL;  Surgeon: [name], MD;  Location: [facility] ENDOSCOPY;  Service: Endoscopy;  Laterality: N/A;  . JOINT REPLACEMENT  2014   Bilateral Knee replacement  . LEAD REVISION/REPAIR N/A 08/29/2017   Procedure: LEAD REVISION/REPAIR;  Surgeon: Constance Haw, MD;  Location: [facility];  Service: Cardiovascular;  Laterality: N/A;  . PACEMAKER INSERTION Left 07/01/2015   Procedure: INSERTION PACEMAKER;  Surgeon: Isaias Cowman, MD;  Location: [facility];  Service: Cardiovascular;  Laterality: Left;  . PACEMAKER REVISION N/A 08/28/2017   Procedure: PACEMAKER REVISION;  Surgeon: Deboraha Sprang, MD;  Location: [facility];  Service: Cardiovascular;  Laterality: N/A;  . REPLACEMENT TOTAL KNEE BILATERAL    . SHOULDER ARTHROSCOPY WITH  SUBACROMIAL DECOMPRESSION Left 2013  . TEE WITHOUT CARDIOVERSION N/A 06/28/2017   Procedure: TRANSESOPHAGEAL ECHOCARDIOGRAM (TEE);  Surgeon: Corey Skains, MD;  Location: [facility];  Service: Cardiovascular;  Laterality: N/A;    There were no vitals filed for this visit.   Subjective Assessment - 06/27/18 1431    Subjective  Patient came in for appointment but had been hospitalized recently and needs a new PT order to be medically cleared prior to physical therapy. Pt was unaware that this was required and agreed to talk to her referring physician at her next appoinmtnet (06/29/18) to get a new order if appropriate. She is also scheduled for an outpatient surgery to address significant GI ucer that is at risk for bleeding. She will be put under anesthesia for this procedure and was informed she would need medical clearance (a new order) following that procedure as well.  Pt unable to be seen today.          Objective measurements completed on examination: See above findings.      PT Long Term Goals - 01/20/15 1257      PT LONG TERM GOAL #1   Title  Patient will be independent and rate pain at < 2/10 with all aggrivating activities.     Time  4    Period  Weeks    Status  On-going      PT LONG TERM GOAL #2   Title  Patient will be able to ambulate > 1000' in 6 minutes for improved independence and cardiovascular endurance.    Baseline  750' in 5 min    Time  4    Period  Weeks    Status  New               Patient will benefit from skilled therapeutic intervention in order to improve the following deficits and impairments:     Visit Diagnosis: Acute low back pain without sciatica, unspecified back pain laterality     Problem List Patient Active Problem List   Diagnosis Date Noted  . RUQ pain   . UTI (urinary tract infection) 06/14/2018  . Type 2 diabetes mellitus with neurological complications ([place]) 93/79/0240  . Acute respiratory failure ([place]) 04/09/2018   . Chronic anticoagulation 04/09/2018  . Pacemaker 04/09/2018  . H/O TIA (transient ischemic attack) and stroke 04/09/2018  . CAD (coronary artery disease) 04/09/2018  . Hematemesis 04/05/2018  . Hypotension 02/04/2018  . Atrial fibrillation with RVR ([place]) 02/04/2018  . Atrial flutter ([place]) 08/24/2017  . Sinus node dysfunction ([place]) 08/24/2017  . Dizziness 06/12/2017  . Mastalgia 02/14/2017  . Acute on chronic renal insufficiency 01/09/2017  . Fatty liver 01/09/2017  . Benign essential HTN 09/08/2016  . Nephrolithiasis 02/22/2016  . Diabetic neuropathy with neurologic complication ([place]) 97/35/3299  . Moderate episode of recurrent major depressive disorder ([place]) 08/11/2015  . Gastritis without bleeding   . Gastric polyp   . Gastro-esophageal reflux disease without esophagitis   . PAF (paroxysmal atrial fibrillation) ([place]) 06/29/2015  . Intestinal obstruction ([place]) 02/20/2015  . DDD (degenerative disc disease), lumbar 01/19/2015  . Back pain 01/12/2015  . Gout 01/12/2015  . Complete tear of left rotator cuff 10/16/2014  . Intervertebral disc disorder with  radiculopathy of lumbar region 09/17/2014  . Lumbar radiculitis 09/17/2014  . Lumbar stenosis with neurogenic claudication 09/17/2014  . Neuritis or radiculitis due to rupture of lumbar intervertebral disc 09/17/2014  . Atherosclerosis of both carotid arteries 07/15/2014  . Essential hypertension 07/15/2014  . Hyperlipemia, mixed 07/15/2014  . 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 Body mass index (BMI) of 40.0-44.9 in adult ([place]) 06/21/2014  . Mixed incontinence 06/25/2013  . Mixed urge and stress incontinence 06/25/2013  . Nocturia 06/25/2013  . Blurring of visual image 04/11/2012    Everlean Alstrom. Graylon Good, PT, DPT 06/27/18, 2:38 PM .   [facility] [address] [place], [address] Phone: [phone]   Fax:  [phone]  Name: GURTHA PICKER MRN: [id number] Date of Birth: [birth year]

## 2018-06-27 NOTE — Telephone Encounter (Signed)
° °   [facility] Pre-operative Risk Assessment    Request for surgical clearance:  1. What type of surgery is being performed? Endoscopy   2. When is this surgery scheduled? 07/09/18   3. What type of clearance is required (medical clearance vs. Pharmacy clearance to hold med vs. Both)? Both   4. Are there any medications that need to be held prior to surgery and how long? Blood Thinner    5. Practice name and name of physician performing surgery? [facility] Dr Jonathon Bellows   6. What is your office phone number [phone]   7.   What is your office fax number [phone]  8.   Anesthesia type (None, local, MAC, general) ?

## 2018-06-28 ENCOUNTER — Ambulatory Visit: Payer: Medicare Other

## 2018-07-03 ENCOUNTER — Ambulatory Visit: Payer: Medicare Other | Admitting: Physical Therapy

## 2018-07-03 ENCOUNTER — Telehealth: Payer: Self-pay | Admitting: Physical Therapy

## 2018-07-03 ENCOUNTER — Encounter: Payer: Self-pay | Admitting: Physical Therapy

## 2018-07-03 ENCOUNTER — Other Ambulatory Visit: Payer: Self-pay

## 2018-07-03 VITALS — BP 117/49 | HR 67

## 2018-07-03 DIAGNOSIS — M6281 Muscle weakness (generalized): Secondary | ICD-10-CM | POA: Diagnosis present

## 2018-07-03 DIAGNOSIS — M545 Low back pain: Principal | ICD-10-CM

## 2018-07-03 DIAGNOSIS — R262 Difficulty in walking, not elsewhere classified: Secondary | ICD-10-CM | POA: Diagnosis present

## 2018-07-03 DIAGNOSIS — G8929 Other chronic pain: Secondary | ICD-10-CM

## 2018-07-03 NOTE — Telephone Encounter (Addendum)
   Primary Cardiologist: [name], MD  Patient called back, indicates she has felt well from cardiac standpoint without interim issue. I discussed in person with [name]. Given past medical history and time since last visit, based on ACC/AHA guidelines, ARIE GABLE would be at acceptable risk for the planned procedure without further cardiovascular testing.   Per pharmacist based on protocol, "Recommend holding Eliquis for only 24 hours prior to endoscopy due to extremely elevated cardiac risk, and resume Eliquis as soon as safely possible afterwards."  The patient states she was told she may need to hold Eliquis for 5 days. I told her we would not be able to clear her to do this -- instead suggesting 24 hours as above, and advised her to speak with the doctor requesting the procedure clearance. 5 days would be excessive for NOAC even in lower risk patients so she will definitely need to clarify with Dr. Vicente Males.  I will route this recommendation to the requesting party via Epic fax function and remove from pre-op pool.  Please call with questions.  Charlie Pitter, PA-C 07/03/2018, 4:25 PM

## 2018-07-03 NOTE — Telephone Encounter (Signed)
Patient needing Cardiac Clearance and Pharmacy Clearance for Upper endoscopy on 07/09/18.  To [name] to review.

## 2018-07-03 NOTE — Telephone Encounter (Signed)
Pt takes Eliquis for afib with CHADS2VASc score of 74 (age x2, sex, CHF, HTN, CAD, DM, TIA/stroke). CrCl 51mL/min, platelet count 222. Recommend holding Eliquis for only 24 hours prior to endoscopy due to extremely elevated cardiac risk, and resume Eliquis as soon as safely possible afterwards.

## 2018-07-03 NOTE — Therapy (Signed)
[facility] [address], [place], [address] Phone: [phone]   Fax:  [phone]  Physical Therapy Evaluation  Patient Details  Name: Deborah Obrien MRN: [id number] Date of Birth: 1941-03-03 Referring Provider (PT): Rennis Harding, MD   Encounter Date: 07/03/2018  SUBJECTIVE: HISTORY:  Patient is a 77 y.o. female who presents to outpatient physical therapy with a referral for low back pain with request for pool therapy. This patient's chief complaints consist of pain, inability to stand up straight, poor activity tolerance following recently being dropped at rehab facility leading to 2 compression fractures that required kyphoplasty, leading to the following functional deficits: difficulty with ADLs, IADLs, bed mobility, transfers, household and community mobility, social and community participation. 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 Recent surgical procedures (in 2019): cardioversion, esophagogastroduendoscopy with propofol (x2), back surgery (pt describes kyphoplasty at 2 levels - first order said fusion), upcoming eye surgery.  See chart for extensive past medical history. Current medications include see extensive list in chart.  Imaging: not currently available. Response to previously administered skilled services: Received home health PT with improvement in functional ability to be able to come to outpatient  physical therapy. Reports difficulty tolerating PT due to pain.  Patient states she has a long history of back pain with a history of 3 other surgeries in the spine (one in the neck, and two in the low back). She reports has a plate and screws in her lumbar spine and neck from previous surgeries. She describes her most recent surgery as putting in a balloon to push the fracture back and adding cement. She agreed that it was a kyphoplasty, athough she has had fusions in the past. Her original PT order from referring provider lists a lumbar fusion. The current order lists LBP. Surgical date is not available and patient cannot remember exactly. From available records it appears to be near the latter half of 05/2018 or early 06/2018.   Patient states her current acute condition started when she fell in SNF based rehab about April 23, 2018. She was at [facility] for rehab after a 23 day hospitalization for pneumonia and hemorrhaging from her stomach.  It was the first night she was there. She describes the incident as nursing using a lift (description resembled an EZ-stand type lift) to help her stand up to undress. She started sliding and strongly requested to be put down. She states she was abruptly lowered into the chair and instantly experienced severe back pain. She was then found to have two broken vertebrae (compression fractures) which lead to surgery. She states she requested to go home after her susrgery instead of rehab. She has had some home health PT but she was in so much pain that her physician suggested pool therapy.   She states prior to fracturing her vertebrae she was able to stand up straight, but now she cannot. She states she was told by the physician's office that she would probably not ever be able to stand up straight again, but that is one of her  goals. She states she feels straighter when she first gets up but starts to "go down" as she is up and about.    FUNCTION Patient-reported  Outcome Measure: deferred to future visit due to technology dysfunction.  PLOF (prior to hospitalization for pneumonia): ambulated indoors with hand held support on furniture. Ambulated short community distances but required seated rests in the grocery store or use of motorized scooter in grocery store. Required help with ADLs and IADLs.  Current Level of Function: ambulates short household distances with rollator (~20 feet) in the community (stops every 20 feet to rest). Ambulates in household to bathroom and back with FWW. Requires help with ADLs (including dressing and bathing) and IADLs.   PAIN Location: low back, started more on right and radiates to left.  Nature: pressure, sore, burning/tingling, occasionally sharp, nagging, constant. Pain Scale:  Patient reports current pain as 4-5/10, at best 3/10, at worst 12-13/10, and during functional activity 12-13/10. Paresthesia: left hand and was in right leg (present prior to back surgery). Aggravating factors: standing up, walking, getting up after prolonged sitting. Easing factors: sitting with back supported.    Falls in the last 6 months: 4-5 falls (last was in the rehab facility when she sustained spinal fractures).    SPECIAL SCREENING QUESTIONS Dizziness, double vision, difficulty swallowing, difficulty speaking, fainting spells or blackouts, facial numbness, or nausea: dizziness when she first gets up (presyncope), right eye is fuzzy (was to have surgery and hopes to have it done next month), occasional difficulty swallowing (cuts food small, has had swallowing test 2 years ago), for last 2 months will be talking and everything will cut off and she suddenly cannot remember what she is saying, right sided face pins (last two months), had nausea directly after her last hospitalization.  Recent illness or fever: recently hospitalized (see chart for details).  Recent unexplained weight loss: denies. 304# to 228# over last 3 years with  effort.  Night pain/sweats: denies. Recent bowel or bladder function abnormality: used to have BM daily prior to back pain, she now takes miralax and other things to improve motility, then had very loose stool on Sunday (on Norco). Current symptoms/concerns not elsewhere described: chronic kidney disease (boarders III-IV, no dialysis).  PT End of Session - 07/04/18 1148    Visit Number  1    Number of Visits  12    Date for PT Re-Evaluation  08/14/18    Authorization Type  Medicare    Authorization Time Period  10    Authorization - Visit Number  1    Authorization - Number of Visits  12    PT Start Time  9147    PT Stop Time  1410    PT Time Calculation (min)  65 min    Activity Tolerance  Patient limited by pain    Behavior During Therapy  [facility] for tasks assessed/performed       Past Medical History:  Diagnosis Date  . A-fib ([place]) 2016  . Acid reflux   . Anxiety   . Bowel obstruction ([place])   . CHF (congestive heart failure) ([place]) 2017  . Chronic kidney disease (CKD) stage G3a/A1, moderately decreased glomerular filtration rate (GFR) between 45-59 mL/min/1.73 square meter and albuminuria creatinine ratio less than 30 mg/g (HCC)   . Diabetes mellitus without complication ([place])   . Dyspnea   . Fatty liver   . GI bleed   . Gout   . Hypertension   . Morbid obesity ([place])   .  Osteoarthritis   . Osteoporosis   . Paroxysmal atrial fibrillation (HCC)   . Presence of permanent cardiac pacemaker   . Sinus node dysfunction ([place]) 08/24/2017  . TIA (transient ischemic attack)     Past Surgical History:  Procedure Laterality Date  . ABDOMINAL HYSTERECTOMY  1980  . APPENDECTOMY    . BACK SURGERY     2008  . BACK SURGERY  2019   patient describes kyphoplasty for compression fractures, MD referral said fusion  . BREAST BIOPSY Right 08/16   stereo fibroadenomatous change, neg for atypia  . BREAST EXCISIONAL BIOPSY Left    neg  . CARDIAC CATHETERIZATION Left 08/25/2016    Procedure: Left Heart Cath and Coronary Angiography;  Surgeon: Corey Skains, MD;  Location: [facility];  Service: Cardiovascular;  Laterality: Left;  . CARDIOVERSION N/A 06/28/2017   Procedure: CARDIOVERSION;  Surgeon: Corey Skains, MD;  Location: [facility];  Service: Cardiovascular;  Laterality: N/A;  . CARDIOVERSION N/A 02/06/2018   Procedure: CARDIOVERSION;  Surgeon: Josue Hector, MD;  Location: [facility];  Service: Cardiovascular;  Laterality: N/A;  . CATARACT EXTRACTION, BILATERAL  2012  . CHOLECYSTECTOMY    . colon blockage  1999  . COLON SURGERY  1999  . COLONOSCOPY  2016   polyps removed 2016  . DORSAL COMPARTMENT RELEASE Left 09/14/2016   Procedure: RELEASE DORSAL COMPARTMENT (DEQUERVAIN);  Surgeon: Dereck Leep, MD;  Location: [facility];  Service: Orthopedics;  Laterality: Left;  . ESOPHAGOGASTRODUODENOSCOPY N/A 01/27/2015   Procedure: ESOPHAGOGASTRODUODENOSCOPY (EGD);  Surgeon: Lollie Sails, MD;  Location: [facility] END[facility]COPY;  Service: Endoscopy;  Laterality: N/A;  . ESOPHAGOGASTRODUODENOSCOPY (EGD) WITH PROPOFOL N/A 07/24/2015   Procedure: ESOPHAGOGASTRODUODENOSCOPY (EGD) WITH PROPOFOL;  Surgeon: Lucilla Lame, MD;  Location: [facility] ENDOSCOPY;  Service: Endoscopy;  Laterality: N/A;  . ESOPHAGOGASTRODUODENOSCOPY (EGD) WITH PROPOFOL N/A 04/12/2018   Procedure: ESOPHAGOGASTRODUODENOSCOPY (EGD) WITH PROPOFOL;  Surgeon: Jonathon Bellows, MD;  Location: [facility] ENDOSCOPY;  Service: Gastroenterology;  Laterality: N/A;  . ESOPHAGOGASTRODUODENOSCOPY (EGD) WITH PROPOFOL N/A 06/22/2018   Procedure: ESOPHAGOGASTRODUODENOSCOPY (EGD) WITH PROPOFOL;  Surgeon: [name], MD;  Location: [facility] ENDOSCOPY;  Service: Endoscopy;  Laterality: N/A;  . EUS N/A 06/22/2018   Procedure: UPPER ENDOSCOPIC ULTRASOUND (EUS) RADIAL;  Surgeon: [name], MD;  Location: [facility] ENDOSCOPY;  Service: Endoscopy;  Laterality: N/A;  . JOINT REPLACEMENT  2014   Bilateral Knee replacement  . LEAD  REVISION/REPAIR N/A 08/29/2017   Procedure: LEAD REVISION/REPAIR;  Surgeon: Constance Haw, MD;  Location: [facility];  Service: Cardiovascular;  Laterality: N/A;  . PACEMAKER INSERTION Left 07/01/2015   Procedure: INSERTION PACEMAKER;  Surgeon: Isaias Cowman, MD;  Location: [facility];  Service: Cardiovascular;  Laterality: Left;  . PACEMAKER REVISION N/A 08/28/2017   Procedure: PACEMAKER REVISION;  Surgeon: Deboraha Sprang, MD;  Location: [facility];  Service: Cardiovascular;  Laterality: N/A;  . REPLACEMENT TOTAL KNEE BILATERAL    . SHOULDER ARTHROSCOPY WITH SUBACROMIAL DECOMPRESSION Left 2013  . TEE WITHOUT CARDIOVERSION N/A 06/28/2017   Procedure: TRANSESOPHAGEAL ECHOCARDIOGRAM (TEE);  Surgeon: Corey Skains, MD;  Location: [facility];  Service: Cardiovascular;  Laterality: N/A;    Vitals:   07/03/18 1349  BP: (!) 117/49  Pulse: 67  SpO2: 98%     Subjective Assessment - 07/04/18 1121    Subjective  Patient states her current acute condition started when she fell in SNF based rehab about April 23, 2018. She was at [facility] for rehab after  a 23 day hospitalization for pneumonia and hemorrhaging from her stomach.  It was the first night she was there. She describes the incident as nursing using a lift (description resembled an EZ-stand type lift) to help her stand up to undress. She started sliding and strongly requested to be put down. She states she was abruptly lowered into the chair and instantly experienced severe back pain. She was then found to have two broken vertebrae (compression fractures) which lead to surgery. She states she requested to go home after her susrgery instead of rehab. She has had some home health PT but she was in so much pain that her physician suggested pool therapy.     Patient is accompained by:  Family member    Pertinent History  Patient is a 77 y.o. female who presents to outpatient physical therapy with a referral for low back  pain with request for pool therapy. This patient's chief complaints consist of pain, inability to stand up straight, poor activity tolerance following recently being dropped at rehab facility leading to 2 compression fractures that required kyphoplasty, leading to the following functional deficits: difficulty with ADLs, IADLs, bed mobility, transfers, household and community mobility, social and community participation.    Limitations  Sitting;Lifting;Standing;Walking;House hold activities   difficulty with ADLs, IADLs, bed mobility, transfers, household and community mobility, social and community participation.Marland Kitchen ambulates short household distances with rollator (~20 feet) in the community (stops every 20 feet to rest). Ambulates in househo   Patient Stated Goals  Be able to stand up straight, improve independence and pain level, improve endurance, get back to usual activities.     Currently in Pain?  Yes    Pain Score  6     Pain Location  Back    Pain Orientation  Lower;Mid    Pain Descriptors / Indicators  Burning;Sore;Pressure;Sharp;Nagging    Pain Type  Surgical pain;Chronic pain    Pain Onset  More than a month ago    Pain Frequency  Constant    Aggravating Factors   standing up, walking, getting up after prolonged sitting.    Pain Relieving Factors  sitting supported, flexing forward and resting arms on table.     Effect of Pain on Daily Activities  Current Level of Function: ambulates short household distances with rollator (~20 feet) in the community (stops every 20 feet to rest). Ambulates in household to bathroom and back with FWW. Requires help with ADLs (including dressing and bathing) and IADLs.        OBJECTIVE: OBSERVATION/INSPECTION: Patient presents with increased thoracic kyphosis, lumbar flattening, unable to stand up fully.  NEUROLOGICAL: Dermatomes: WNL except left L5 diminished to light touch. . Myotomes: WNL except pain with exertion of right hip.  Reflexes:   - Quadriceps reflex (L4):B = 2+ - Achilles reflex (S1): B = 0 Upper Motor Neuron Screen: . - Hoffman's: B = negative - Clonus (at ankle): B = negative   SPINE MOTION Lumbar AROM: - Flexion: = painful in seated.  - Extension: = unable to reach neutral in standing.  - Other motions not tested due to high level of pain.  AROM limited by stiffness pain. Marland Kitchen  PERIPHERAL JOINT MOTION (AROM/PROM in degrees): - BLE grossly WFL, except lacking hip extension to stand up straight. Deferred specific testing to later date.  STRENGTH:  - BLE grossly 4/5, specific hip extension and abduction testing deferred to later date.   FUNCTIONAL MOBILITY: - Bed mobility: not tested due to high level of  pain - Transfers: SBA, push off with hands chair height and to car - Gait: 20 feet SBA with rollator, required cuing to lock Rolator. Demo slow speed, wide stance, stooped posture, quick fatigue, dyspnea. - Stairs: not tested.   PRECAUTIONS: - Fall risk - Recent back surgery, no heavy lifting  Objective measurements completed on examination: See above findings.   TREATMENT:  Therapeutic exercise: to centralize symptoms and improve ROM and strength required for successful completion of functional activities.  - ambulated 20 feet x3 with rollator and SBA, required cuing to rest when fatigued, Demo slow speed, wide stance, stooped posture, quick fatigue, dyspnea.  - Education on diagnosis, prognosis, POC, anatomy and physiology of current condition.   Patient response to treatment:  Pt tolerated treatment fair. She had increased pain level with sitting and had great difficulty with ambulation by end of session due to increased pain levels. Objective exam was modified due to increasing pain and patient was assisted to vehicle for safety. Patient stated she was in no more pain than she had in the morning prior to coming to PT. Patient stated she was confident in her ability to go home with her husband's help.  Husband demonstrated ability to assist patient. Pt required cuing for safety when ambulating to car including cues to lock rollator prior to performing sit <> stand and cues to rest when struggling to continue ambulating.      PT Education - 07/04/18 1145    Education provided  Yes    Education Details  continue HEP from [place], will need to to see [place] after next surgery and will need new order for PT after next surgery.     Person(s) Educated  Patient;Spouse    Methods  Explanation    Comprehension  Verbalized understanding       PT Short Term Goals - 07/04/18 1212      PT SHORT TERM GOAL #1   Title  Be independent with home exercise program completed at least 3 times per week for self-management of symptoms.    Baseline  currently performing HEP from home health PT    Time  4    Period  Weeks    Status  New    Target Date  08/01/18        PT Long Term Goals - 07/04/18 1216      PT LONG TERM GOAL #1   Title  Be independent with a long-term home exercise program for self-management of symptoms.     Baseline  requires more instruction, has not received final HEP (07/03/2018);    Time  6    Period  Weeks    Status  New    Target Date  08/14/18      PT LONG TERM GOAL #2   Title  Patient will be able to ambulate > 200 feet with LRAD for improved community mobility    Baseline  20 (07/03/2018);    Time  6    Period  Weeks    Target Date  08/14/18      PT LONG TERM GOAL #3   Title  Reduce pain with functional activities to equal or less than 3/10 to allow patient to be more independent with usual activities including ADLs, IADLs, and social engagement with less difficulty.     Baseline  12/10 (07/03/2018);    Time  6    Period  Weeks    Status  New    Target  Date  08/14/18      PT LONG TERM GOAL #4   Title  Be able to complete 5 sit to stand test in 30 seconds to improve functional mobility for transfers and community participation.     Baseline  Difficulty  with one repetition, requires use of hands (07/03/2018);    Time  6    Period  Weeks    Status  New    Target Date  08/14/18             Plan - 07/04/18 1224    Clinical Impression Statement  Patient is a 77 y.o. female referred to outpatient physical therapy with a diagnosis of low back pain with request for aquatic therapy (s/p lumbar surgery after being dropped causing 2 compression fractures) who presents with signs and symptoms consistent with low back pain s/p apparent kyphoplasty and generalized deconditioning with severe difficulty with functional activities.     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 Recent surgical procedures (in 2019): cardioversion, esophagogastroduendoscopy with propofol (x2), back surgery (pt describes kyphoplasty at 2 levels - first order said fusion), upcoming eye surgery.  See chart for extensive past medical history.    Clinical Presentation  Evolving    Clinical Presentation due to:  Patient's condition is changing due to need for further surgery and comorbidities.     Clinical Decision Making  Moderate    Rehab Potential  Fair    Clinical Impairments Affecting Rehab Potential  (+) patient motivation, spousal support; (-)  High fall risk, severity of symptoms, chronicity of functional impariment, extensiveness of comorbidites.    PT Frequency  2x / week    PT Duration  6 weeks    PT  Treatment/Interventions  ADLs/Self Care Home Management;Aquatic Therapy;Moist Heat;Cryotherapy;Gait training;Stair training;Functional mobility training;Therapeutic activities;Therapeutic exercise;Balance training;Neuromuscular re-education;Patient/family education;Wheelchair mobility training;Manual techniques;Passive range of motion;Energy conservation;Joint Manipulations    PT Next Visit Plan  re-assess after surgery    PT Home Exercise Plan  Continue HEP from [facility]    Consulted and Agree with Plan of Care  Patient      ASSESSMENT:  Patient is a 77 y.o. female referred to outpatient physical therapy with a diagnosis of low back pain with request for aquatic therapy (s/p lumbar surgery after being dropped causing 2 compression fractures) who presents with signs and symptoms consistent with low back pain s/p apparent kyphoplasty and generalized deconditioning with severe difficulty with functional activities.   Patient would benefit from aquatic therapy due to effect of boyance that will reduce compression forces at the spine and allow her to move more freely and with less pain. She is fall risk will require close monitoring and assistance with preparation and participation in aquatic therapy. Patient will require re-assessment from physical therapist following her upcoming surgery prior to starting aquatic therapy. Appropriateness for aquatic therapy will be re-assessed at that time.   Patient will benefit from skilled therapeutic intervention in order to improve the following deficits and impairments:  Abnormal gait, Decreased  balance, Decreased endurance, Decreased mobility, Difficulty walking, Hypomobility, Increased muscle spasms, Impaired sensation, Cardiopulmonary status limiting activity, Decreased range of motion, Impaired perceived functional ability, Improper body mechanics, Obesity, Decreased activity tolerance, Decreased coordination, Decreased strength, Decreased safety awareness, Impaired  flexibility, Pain, Postural dysfunction  Visit Diagnosis: Chronic midline low back pain without sciatica  Difficulty in walking, not elsewhere classified  Muscle weakness (generalized)     Problem List Patient Active Problem List   Diagnosis Date Noted  . RUQ pain   . UTI (urinary tract infection) 06/14/2018  . Type 2 diabetes mellitus with neurological complications ([place]) 39/11/90  . Acute respiratory failure ([place]) 04/09/2018  . Chronic anticoagulation 04/09/2018  . Pacemaker 04/09/2018  . H/O TIA (transient ischemic attack) and stroke 04/09/2018  . CAD (coronary artery disease) 04/09/2018  . Hematemesis 04/05/2018  . Hypotension 02/04/2018  . Atrial fibrillation with RVR ([place]) 02/04/2018  . Atrial flutter ([place]) 08/24/2017  . Sinus node dysfunction ([place]) 08/24/2017  . Dizziness 06/12/2017  . Mastalgia 02/14/2017  . Acute on chronic renal insufficiency 01/09/2017  . Fatty liver 01/09/2017  . Benign essential HTN 09/08/2016  . Nephrolithiasis 02/22/2016  . Diabetic neuropathy with neurologic complication ([place]) 33/00/7622  . Moderate episode of recurrent major depressive disorder ([place]) 08/11/2015  . Gastritis without bleeding   . Gastric polyp   . Gastro-esophageal reflux disease without esophagitis   . PAF (paroxysmal atrial fibrillation) ([place]) 06/29/2015  . Intestinal obstruction ([place]) 02/20/2015  . DDD (degenerative disc disease), lumbar 01/19/2015  . Back pain 01/12/2015  . Gout 01/12/2015  . Complete tear of left rotator cuff 10/16/2014  . Intervertebral disc disorder with radiculopathy of lumbar region 09/17/2014  . Lumbar radiculitis 09/17/2014  . Lumbar stenosis with neurogenic claudication 09/17/2014  . Neuritis or radiculitis due to rupture of lumbar intervertebral disc 09/17/2014  . Atherosclerosis of both carotid arteries 07/15/2014  . Essential hypertension 07/15/2014  . Hyperlipemia, mixed 07/15/2014  . 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 Body  mass index (BMI) of 40.0-44.9 in adult ([place]) 06/21/2014  . Mixed incontinence 06/25/2013  . Mixed urge and stress incontinence 06/25/2013  . Nocturia 06/25/2013  . Blurring of visual image 04/11/2012    Everlean Alstrom. Graylon Good, PT, DPT 07/04/2018, 12:37 PM  [facility] [address] [place], [address] Phone: [phone]   Fax:  [phone]  Name: Deborah Obrien MRN: [id number] Date of Birth: Jan 21, 1941

## 2018-07-03 NOTE — Telephone Encounter (Signed)
   Primary Cardiologist: [name], MD  Chart reviewed as part of pre-operative protocol coverage. There is a 6 day backlog in pre-op, hence charts from 10/16 getting reviewed (date entered as TBD).  At 05/22/18 [name] states "She had not missed any of her anticoagulation.  We were successful in paced termination of her atrial flutter/tachycardia. If she maintains sinus rhythm, we will maintain her on amiodarone to this end, she could have her anticoagulation held in about 3 weeks time for her endoscopy." However, there is a clearance from 9/17 where pharmD spoke with [name] regarding separate clearance for Eliquis/spine surgery. I tried to reach pt to see how she is doing and got VM; this ended abruptly before I could leave message.  Await Dr. Aquilla Hacker input on case. Will also cc to pharm for input on how long they would suggest holding anticoagulation.  Charlie Pitter, PA-C 07/03/2018, 2:49 PM

## 2018-07-04 ENCOUNTER — Encounter: Payer: Self-pay | Admitting: Physical Therapy

## 2018-07-04 ENCOUNTER — Telehealth: Payer: Self-pay | Admitting: Gastroenterology

## 2018-07-04 ENCOUNTER — Telehealth: Payer: Self-pay | Admitting: Internal Medicine

## 2018-07-04 NOTE — Telephone Encounter (Signed)
I spoke with Dr Irene Pap office. They require Eliquis to be held for 48 hours prior to endoscopy. Previous recommendation was for holding 24 hours. Dr Caryl Comes are you OK with holding Eliquis 48 hours pre endoscopy? If not Dr Vicente Males suggest Lovenox crossover. Her procedure is scheduled for Oct 28th.  Kerin Ransom PA-C 07/04/2018 2:11 PM

## 2018-07-04 NOTE — Patient Instructions (Signed)
You will need to come back to see the physical therapist on land for a re-assessment after your next surgery prior to starting aquatic therapy.  You will need clearance and a new order from your doctor for PT after your next surgery. Please let us know when you have it.

## 2018-07-04 NOTE — Telephone Encounter (Signed)
Opened by mistake. Did not contact patient beyond what has already been documented.

## 2018-07-04 NOTE — Telephone Encounter (Signed)
Follow Up

## 2018-07-04 NOTE — Telephone Encounter (Signed)
Patient called and would like to talk with Lurline Hare again about the Endoscopy schedule for 07-09-18.

## 2018-07-04 NOTE — Telephone Encounter (Signed)
Spoke with pt regarding endoscopy procedure. Pt requests clarification on what medications she should stop taking 5 days prior to procedure. I explained she will need to hold any fish oil, vitamins, and/or iron supplements she may be taking.

## 2018-07-05 ENCOUNTER — Encounter: Payer: Self-pay | Admitting: Physician Assistant

## 2018-07-05 NOTE — Telephone Encounter (Signed)
Will forward to Dr. Vicente Males via [place] Fax.

## 2018-07-05 NOTE — Telephone Encounter (Signed)
Spoke with pt and informed her that [name] approves holding the blood thinner (Eliquis) for 48 hours prior to endoscopy procedure.

## 2018-07-05 NOTE — Progress Notes (Signed)
Moving from staff messages for documentation.  Charlie Pitter, PA-C  Rushie Chestnut, CMA        Given high CHADSVASC, pharmacy has recommended 24 hours but it looks like the pre-op team has routed to [name] for a final answer. (The APP covering the pre-op team rotates so I am no longer following this chart.) You can follow along in the most recent phone note dated 10/23. Thanks!   Previous Messages    ----- Message -----  From: Rushie Chestnut, [place]  Sent: 07/04/2018  1:41 PM EDT  To: Charlie Pitter, PA-C  Subject: FW: Blood thinner holding               ----- Message -----  From: Jonathon Bellows, MD  Sent: 07/03/2018  5:37 PM EDT  To: Rushie Chestnut, CMA  Subject: RE: Blood thinner holding             Would need two days off eloquis otherwise they can bridge with lovenox,she hasa largegastricpolyp     ----- Message -----  From: Rushie Chestnut, [place]  Sent: 07/03/2018  4:53 PM EDT  To: Jonathon Bellows, MD  Subject: Blood thinner holding               Dr. Vicente Males,   Mrs. Mancebo called and stated she spoke with [name] about holding instructions for the Eliquis, she was told she would only be able to stop the Eliquis no more than 24 hours prior to EGD due to cardiac risk. Pt wants to know if this is acceptable for the procedure.

## 2018-07-05 NOTE — Telephone Encounter (Signed)
Ok to hold [place] for 48 hrs  Hope this helps SK

## 2018-07-05 NOTE — Telephone Encounter (Signed)
See below

## 2018-07-05 NOTE — Telephone Encounter (Signed)
Patient's husband called today wanting clarification for the blood thinner patient is taking how many days to be off decadency from Dr Caryl Comes & Dr Vicente Males procedure on Monday 07-09-18 Please advise!!

## 2018-07-09 ENCOUNTER — Other Ambulatory Visit: Payer: Self-pay

## 2018-07-09 ENCOUNTER — Ambulatory Visit
Admission: RE | Admit: 2018-07-09 | Discharge: 2018-07-09 | Disposition: A | Discharge status: Home / Self Care | Payer: Medicare Other | Source: Ambulatory Visit | Attending: Gastroenterology | Admitting: Gastroenterology

## 2018-07-09 ENCOUNTER — Encounter
Admission: RE | Disposition: A | Discharge status: Home / Self Care | Payer: Self-pay | Source: Ambulatory Visit | Attending: Gastroenterology

## 2018-07-09 ENCOUNTER — Ambulatory Visit: Payer: Medicare Other | Admitting: Anesthesiology

## 2018-07-09 DIAGNOSIS — Z9841 Cataract extraction status, right eye: Secondary | ICD-10-CM | POA: Diagnosis not present

## 2018-07-09 DIAGNOSIS — Z79899 Other long term (current) drug therapy: Secondary | ICD-10-CM | POA: Diagnosis not present

## 2018-07-09 DIAGNOSIS — Z8249 Family history of ischemic heart disease and other diseases of the circulatory system: Secondary | ICD-10-CM | POA: Insufficient documentation

## 2018-07-09 DIAGNOSIS — I495 Sick sinus syndrome: Secondary | ICD-10-CM | POA: Diagnosis not present

## 2018-07-09 DIAGNOSIS — Z9981 Dependence on supplemental oxygen: Secondary | ICD-10-CM | POA: Diagnosis not present

## 2018-07-09 DIAGNOSIS — Z8673 Personal history of transient ischemic attack (TIA), and cerebral infarction without residual deficits: Secondary | ICD-10-CM | POA: Insufficient documentation

## 2018-07-09 DIAGNOSIS — M109 Gout, unspecified: Secondary | ICD-10-CM | POA: Insufficient documentation

## 2018-07-09 DIAGNOSIS — Z950 Presence of cardiac pacemaker: Secondary | ICD-10-CM | POA: Diagnosis not present

## 2018-07-09 DIAGNOSIS — K317 Polyp of stomach and duodenum: Secondary | ICD-10-CM | POA: Diagnosis present

## 2018-07-09 DIAGNOSIS — E1122 Type 2 diabetes mellitus with diabetic chronic kidney disease: Secondary | ICD-10-CM | POA: Diagnosis not present

## 2018-07-09 DIAGNOSIS — I130 Hypertensive heart and chronic kidney disease with heart failure and stage 1 through stage 4 chronic kidney disease, or unspecified chronic kidney disease: Secondary | ICD-10-CM | POA: Insufficient documentation

## 2018-07-09 DIAGNOSIS — N183 Chronic kidney disease, stage 3 (moderate): Secondary | ICD-10-CM | POA: Insufficient documentation

## 2018-07-09 DIAGNOSIS — Z9842 Cataract extraction status, left eye: Secondary | ICD-10-CM | POA: Insufficient documentation

## 2018-07-09 DIAGNOSIS — I480 Paroxysmal atrial fibrillation: Secondary | ICD-10-CM | POA: Diagnosis not present

## 2018-07-09 DIAGNOSIS — M1990 Unspecified osteoarthritis, unspecified site: Secondary | ICD-10-CM | POA: Diagnosis not present

## 2018-07-09 DIAGNOSIS — K922 Gastrointestinal hemorrhage, unspecified: Secondary | ICD-10-CM | POA: Diagnosis present

## 2018-07-09 DIAGNOSIS — F419 Anxiety disorder, unspecified: Secondary | ICD-10-CM | POA: Diagnosis not present

## 2018-07-09 DIAGNOSIS — Z6838 Body mass index (BMI) 38.0-38.9, adult: Secondary | ICD-10-CM | POA: Diagnosis not present

## 2018-07-09 DIAGNOSIS — I509 Heart failure, unspecified: Secondary | ICD-10-CM | POA: Insufficient documentation

## 2018-07-09 DIAGNOSIS — Z96653 Presence of artificial knee joint, bilateral: Secondary | ICD-10-CM | POA: Diagnosis not present

## 2018-07-09 DIAGNOSIS — Z7901 Long term (current) use of anticoagulants: Secondary | ICD-10-CM | POA: Insufficient documentation

## 2018-07-09 DIAGNOSIS — K219 Gastro-esophageal reflux disease without esophagitis: Secondary | ICD-10-CM | POA: Diagnosis not present

## 2018-07-09 HISTORY — PX: ESOPHAGOGASTRODUODENOSCOPY (EGD) WITH PROPOFOL: SHX5813

## 2018-07-09 LAB — GLUCOSE, CAPILLARY: GLUCOSE-CAPILLARY: 287 mg/dL — AB (ref 70–99)

## 2018-07-09 SURGERY — ESOPHAGOGASTRODUODENOSCOPY (EGD) WITH PROPOFOL
Anesthesia: General

## 2018-07-09 MED ORDER — SODIUM CHLORIDE 0.9 % IJ SOLN
PREFILLED_SYRINGE | INTRAMUSCULAR | Status: DC | PRN
  Administered 2018-07-09: 6 mL

## 2018-07-09 MED ORDER — PROPOFOL 500 MG/50ML IV EMUL
INTRAVENOUS | Status: AC
  Filled 2018-07-09: qty 50

## 2018-07-09 MED ORDER — FENTANYL CITRATE (PF) 100 MCG/2ML IJ SOLN
INTRAMUSCULAR | Status: AC
  Filled 2018-07-09: qty 4

## 2018-07-09 MED ORDER — PROMETHAZINE HCL 25 MG/ML IJ SOLN
INTRAMUSCULAR | Status: AC
  Administered 2018-07-09: 12.5 mg via INTRAVENOUS
  Filled 2018-07-09: qty 1

## 2018-07-09 MED ORDER — SODIUM CHLORIDE 0.9 % IJ SOLN
INTRAMUSCULAR | Status: DC | PRN
  Administered 2018-07-09: 12 mL

## 2018-07-09 MED ORDER — PROPOFOL 500 MG/50ML IV EMUL
INTRAVENOUS | Status: DC | PRN
  Administered 2018-07-09: 130 ug/kg/min via INTRAVENOUS

## 2018-07-09 MED ORDER — ONDANSETRON HCL 4 MG/2ML IJ SOLN
INTRAMUSCULAR | Status: DC | PRN
  Administered 2018-07-09: 4 mg via INTRAVENOUS

## 2018-07-09 MED ORDER — SODIUM CHLORIDE 0.9 % IV SOLN
INTRAVENOUS | Status: DC
  Administered 2018-07-09: 10:00:00 via INTRAVENOUS

## 2018-07-09 MED ORDER — PROPOFOL 10 MG/ML IV BOLUS
INTRAVENOUS | Status: AC
  Filled 2018-07-09: qty 20

## 2018-07-09 MED ORDER — PROPOFOL 10 MG/ML IV BOLUS
INTRAVENOUS | Status: DC | PRN
  Administered 2018-07-09: 25 mg via INTRAVENOUS
  Administered 2018-07-09: 80 mg via INTRAVENOUS
  Administered 2018-07-09: 25 mg via INTRAVENOUS

## 2018-07-09 NOTE — Op Note (Signed)
[facility] Gastroenterology Patient Name: Deborah Obrien Procedure Date: 07/09/2018 10:48 AM MRN: [id number] Account #: [id number] Date of Birth: [birth year] Admit Type: Outpatient Age: 77 Room: [facility] ENDO ROOM 4 Gender: Female Note Status: Finalized Procedure:            Upper GI endoscopy Indications:          Recent gastrointestinal bleeding Providers:            Jonathon Bellows MD, MD Referring MD:         Ocie Cornfield. Ouida Sills MD, MD (Referring MD) Medicines:            Monitored Anesthesia Care Complications:        No immediate complications. Procedure:            Pre-Anesthesia Assessment:                       - Prior to the procedure, a History and Physical was                        performed, and patient medications, allergies and                        sensitivities were reviewed. The patient's tolerance of                        previous anesthesia was reviewed.                       - The risks and benefits of the procedure and the                        sedation options and risks were discussed with the                        patient. All questions were answered and informed                        consent was obtained.                       - ASA Grade Assessment: III - A patient with severe                        systemic disease.                       After obtaining informed consent, the endoscope was                        passed under direct vision. Throughout the procedure,                        the patient's blood pressure, pulse, and oxygen                        saturations were monitored continuously. The Endoscope                        was introduced through the mouth, and advanced to the  third part of duodenum. The upper GI endoscopy was                        accomplished without difficulty. The patient tolerated                        the procedure well. Findings:      The examined duodenum was normal.      The  esophagus was normal.      A single 20 mm sessile polyp with bleeding and stigmata of recent       bleeding was found on the greater curvature of the gastric body.       Preparations were made for mucosal resection. 6 mL of saline was       injected with adequate lift of the lesion from the muscularis propria.       Snare mucosal resection with Jabier Mutton net retrieval was performed. A 22 mm       area was resected. Resection and retrieval were complete. There was no       bleeding during and at the end of the procedure. To prevent bleeding       after mucosal resection, four hemostatic clips were successfully placed.       There was no bleeding during, or at the end, of the procedure. Area was       successfully injected with 3 mL of a 1:10,000 solution of epinephrine       for hemostasis.      A single 15 mm pedunculated and sessile polyp with no bleeding and no       stigmata of recent bleeding was found on the lesser curvature of the       gastric body. Area was successfully injected with 6 mL saline for a lift       polypectomy. The polyp was removed with a hot snare. Resection and       retrieval were complete. To prevent bleeding after the polypectomy, two       hemostatic clips were successfully placed. There was no bleeding during,       or at the end, of the procedure.      Two 8 mm sessile polyps with no bleeding and no stigmata of recent       bleeding were found on the greater curvature of the gastric body. The       polyp was removed with a hot snare. Resection and retrieval were       complete. To prevent bleeding after the polypectomy, two hemostatic       clips were successfully placed. There was no bleeding during, or at the       end, of the procedure.      Multiple 5 to 8 mm sessile polyps with no bleeding and no stigmata of       recent bleeding were found in the entire examined stomach. Did not       resect today- only resected the largest      The cardia and gastric  fundus were normal on retroflexion. Impression:           - Normal examined duodenum.                       - Normal esophagus.                       -  A single gastric polyp. Resected and retrieved. Clips                        were placed. Injected.                       - A single gastric polyp. Resected and retrieved.                        Injected. Clips were placed.                       - Two gastric polyps. Resected and retrieved. Clips                        were placed.                       - Multiple gastric polyps.                       - Mucosal resection was performed. Resection and                        retrieval were complete. Recommendation:       - Discharge patient to home (with escort).                       - Advance diet as tolerated.                       - Continue present medications.                       - Resume Eliquis (apixaban) at prior dose today.                       - Await pathology results.                       - Return to my office in 4 weeks. Procedure Code(s):    --- Professional ---                       [phone], Esophagogastroduodenoscopy, flexible, transoral;                        with endoscopic mucosal resection                       43255, 59, Esophagogastroduodenoscopy, flexible,                        transoral; with control of bleeding, any method                       43251, 59, Esophagogastroduodenoscopy, flexible,                        transoral; with removal of tumor(s), polyp(s), or other                        lesion(s) by snare technique Diagnosis Code(s):    --- Professional ---  K31.7, Polyp of stomach and duodenum                       K92.2, Gastrointestinal hemorrhage, unspecified CPT copyright 2018 American Medical Association. All rights reserved. The codes documented in this report are preliminary and upon coder review may  be revised to meet current compliance requirements. Jonathon Bellows, MD Jonathon Bellows MD, MD 07/09/2018 11:36:41 AM This report has been signed electronically. Number of Addenda: 0 Note Initiated On: 07/09/2018 10:48 AM      [facility]

## 2018-07-09 NOTE — Anesthesia Post-op Follow-up Note (Signed)
Anesthesia QCDR form completed.        

## 2018-07-09 NOTE — Transfer of Care (Signed)
Immediate Anesthesia Transfer of Care Note  Patient: Deborah Obrien  Procedure(s) Performed: ESOPHAGOGASTRODUODENOSCOPY (EGD) WITH PROPOFOL with resection of gastric polyps (N/A )  Patient Location: PACU  Anesthesia Type:General  Level of Consciousness: drowsy  Airway & Oxygen Therapy: Patient Spontanous Breathing and Patient connected to nasal cannula oxygen  Post-op Assessment: Report given to RN and Post -op Vital signs reviewed and stable  Post vital signs: Reviewed and stable  Last Vitals:  Vitals Value Taken Time  BP 135/54 07/09/2018 11:37 AM  Temp 36.1 C 07/09/2018 11:36 AM  Pulse 61 07/09/2018 11:38 AM  Resp 11 07/09/2018 11:38 AM  SpO2 98 % 07/09/2018 11:38 AM  Vitals shown include unvalidated device data.  Last Pain:  Vitals:   07/09/18 1136  TempSrc: Tympanic  PainSc: 6          Complications: No apparent anesthesia complications

## 2018-07-09 NOTE — Anesthesia Preprocedure Evaluation (Signed)
Anesthesia Evaluation  Patient identified by MRN, date of birth, ID band Patient awake    Reviewed: Allergy & Precautions, H&P , NPO status , Patient's Chart, lab work & pertinent test results  History of Anesthesia Complications Negative for: history of anesthetic complications  Airway Mallampati: III  TM Distance: >3 FB Neck ROM: full    Dental  (+) Chipped, Upper Dentures   Pulmonary neg shortness of breath,           Cardiovascular Exercise Tolerance: Good hypertension, + CAD and +CHF  + dysrhythmias Atrial Fibrillation + pacemaker      Neuro/Psych PSYCHIATRIC DISORDERS TIA Neuromuscular disease negative psych ROS   GI/Hepatic negative GI ROS, Neg liver ROS, GERD  Medicated and Controlled,  Endo/Other  diabetes, Type 2  Renal/GU CRFRenal disease  negative genitourinary   Musculoskeletal  (+) Arthritis ,   Abdominal   Peds  Hematology negative hematology ROS (+)   Anesthesia Other Findings Past Medical History: 2016: A-fib ([place]) No date: Acid reflux No date: Anxiety No date: Bowel obstruction ([place]) 2017: CHF (congestive heart failure) (HCC) No date: Chronic kidney disease (CKD) stage G3a/A1, moderately  decreased glomerular filtration rate (GFR) between 45-59 mL/min/1.73  square meter and albuminuria creatinine ratio less than 30 mg/g (HCC) No date: Diabetes mellitus without complication (HCC) No date: Dyspnea No date: Fatty liver No date: GI bleed No date: Gout No date: Hypertension No date: Morbid obesity ([place]) No date: Osteoarthritis No date: Osteoporosis No date: Paroxysmal atrial fibrillation (HCC) No date: Presence of permanent cardiac pacemaker 08/24/2017: Sinus node dysfunction (HCC) No date: TIA (transient ischemic attack)  Past Surgical History: 1980: ABDOMINAL HYSTERECTOMY No date: APPENDECTOMY No date: BACK SURGERY     Comment:  2008 2019: BACK SURGERY     Comment:  patient  describes kyphoplasty for compression fractures,              MD referral said fusion 08/16: BREAST BIOPSY; Right     Comment:  stereo fibroadenomatous change, neg for atypia No date: BREAST EXCISIONAL BIOPSY; Left     Comment:  neg 08/25/2016: CARDIAC CATHETERIZATION; Left     Comment:  Procedure: Left Heart Cath and Coronary Angiography;                Surgeon: Corey Skains, MD;  Location: [place]               CV LAB;  Service: Cardiovascular;  Laterality: Left; 06/28/2017: CARDIOVERSION; N/A     Comment:  Procedure: CARDIOVERSION;  Surgeon: Corey Skains,               MD;  Location: [facility] ORS;  Service: Cardiovascular;                Laterality: N/A; 02/06/2018: CARDIOVERSION; N/A     Comment:  Procedure: CARDIOVERSION;  Surgeon: Josue Hector, MD;              Location: [facility] ENDOSCOPY;  Service: Cardiovascular;                Laterality: N/A; 2012: CATARACT EXTRACTION, BILATERAL No date: CHOLECYSTECTOMY 1999: colon blockage 1999: COLON SURGERY 2016: COLONOSCOPY     Comment:  polyps removed 2016 09/14/2016: DORSAL COMPARTMENT RELEASE; Left     Comment:  Procedure: RELEASE DORSAL COMPARTMENT (DEQUERVAIN);                Surgeon: Dereck Leep, MD;  Location: [facility] ORS;  Service: Orthopedics;  Laterality: Left; 01/27/2015: ESOPHAGOGASTRODUODENOSCOPY; N/A     Comment:  Procedure: ESOPHAGOGASTRODUODENOSCOPY (EGD);  Surgeon:               Lollie Sails, MD;  Location: [facility] ENDOSCOPY;                Service: Endoscopy;  Laterality: N/A; 07/24/2015: ESOPHAGOGASTRODUODENOSCOPY (EGD) WITH PROPOFOL; N/A     Comment:  Procedure: ESOPHAGOGASTRODUODENOSCOPY (EGD) WITH               PROPOFOL;  Surgeon: Lucilla Lame, MD;  Location: [facility]               ENDOSCOPY;  Service: Endoscopy;  Laterality: N/A; 04/12/2018: ESOPHAGOGASTRODUODENOSCOPY (EGD) WITH PROPOFOL; N/A     Comment:  Procedure: ESOPHAGOGASTRODUODENOSCOPY (EGD) WITH               PROPOFOL;  Surgeon: Jonathon Bellows, MD;  Location: [facility]               ENDOSCOPY;  Service: Gastroenterology;  Laterality: N/A; 06/22/2018: ESOPHAGOGASTRODUODENOSCOPY (EGD) WITH PROPOFOL; N/A     Comment:  Procedure: ESOPHAGOGASTRODUODENOSCOPY (EGD) WITH               PROPOFOL;  Surgeon: [name], MD;  Location: [facility]               ENDOSCOPY;  Service: Endoscopy;  Laterality: N/A; 06/22/2018: EUS; N/A     Comment:  Procedure: UPPER ENDOSCOPIC ULTRASOUND (EUS) RADIAL;                Surgeon: [name], MD;  Location: [facility] ENDOSCOPY;                Service: Endoscopy;  Laterality: N/A; 2014: JOINT REPLACEMENT     Comment:  Bilateral Knee replacement 08/29/2017: LEAD REVISION/REPAIR; N/A     Comment:  Procedure: LEAD REVISION/REPAIR;  Surgeon: Constance Haw, MD;  Location: [place] CV LAB;  Service:               Cardiovascular;  Laterality: N/A; 07/01/2015: PACEMAKER INSERTION; Left     Comment:  Procedure: INSERTION PACEMAKER;  Surgeon: Isaias Cowman, MD;  Location: [facility] ORS;  Service:               Cardiovascular;  Laterality: Left; 08/28/2017: PACEMAKER REVISION; N/A     Comment:  Procedure: PACEMAKER REVISION;  Surgeon: Deboraha Sprang, MD;  Location: [place] CV LAB;  Service:               Cardiovascular;  Laterality: N/A; No date: REPLACEMENT TOTAL KNEE BILATERAL 2013: SHOULDER ARTHROSCOPY WITH SUBACROMIAL DECOMPRESSION; Left 06/28/2017: TEE WITHOUT CARDIOVERSION; N/A     Comment:  Procedure: TRANSESOPHAGEAL ECHOCARDIOGRAM (TEE);                Surgeon: Corey Skains, MD;  Location: [facility] ORS;                Service: Cardiovascular;  Laterality: N/A;  BMI    Body Mass Index:  38.69 kg/m      Reproductive/Obstetrics negative OB ROS  Anesthesia Physical Anesthesia Plan  ASA: III  Anesthesia Plan: General   Post-op Pain Management:    Induction: Intravenous  PONV Risk  Score and Plan: Propofol infusion and TIVA  Airway Management Planned: Natural Airway and Nasal Cannula  Additional Equipment:   Intra-op Plan:   Post-operative Plan:   Informed Consent: I have reviewed the patients History and Physical, chart, labs and discussed the procedure including the risks, benefits and alternatives for the proposed anesthesia with the patient or authorized representative who has indicated his/her understanding and acceptance.   Dental Advisory Given  Plan Discussed with: Anesthesiologist, CRNA and Surgeon  Anesthesia Plan Comments: (Patient consented for risks of anesthesia including but not limited to:  - adverse reactions to medications - risk of intubation if required - damage to teeth, lips or other oral mucosa - sore throat or hoarseness - Damage to heart, brain, lungs or loss of life  Patient voiced understanding.)        Anesthesia Quick Evaluation

## 2018-07-09 NOTE — OR Nursing (Signed)
Spoke with Dr. Vicente Males on phone, if pt can drink some ginger ale with vomiting she can go home.

## 2018-07-09 NOTE — OR Nursing (Signed)
Pt reports she has been able to take sips of liquids and keep them down.  Pt voices she is ready to go home.

## 2018-07-09 NOTE — Anesthesia Postprocedure Evaluation (Signed)
Anesthesia Post Note  Patient: Deborah Obrien  Procedure(s) Performed: ESOPHAGOGASTRODUODENOSCOPY (EGD) WITH PROPOFOL with resection of gastric polyps (N/A )  Patient location during evaluation: Endoscopy Anesthesia Type: General Level of consciousness: awake and alert Pain management: pain level controlled Vital Signs Assessment: post-procedure vital signs reviewed and stable Respiratory status: spontaneous breathing, nonlabored ventilation, respiratory function stable and patient connected to nasal cannula oxygen Cardiovascular status: blood pressure returned to baseline and stable Postop Assessment: no apparent nausea or vomiting Anesthetic complications: no     Last Vitals:  Vitals:   07/09/18 1230 07/09/18 1240  BP: (!) 133/59 127/75  Pulse:  68  Resp:  20  Temp:    SpO2: 92% 99%    Last Pain:  Vitals:   07/09/18 1240  TempSrc:   PainSc: 5                  Precious Haws Demiana Crumbley

## 2018-07-09 NOTE — H&P (Signed)
Jonathon Bellows, MD [address] [place], [place], [place], [id number], [place], [place], [place], [address] Phone: [phone]  Fax: [phone]  Primary Care Physician:  [name], MD   Pre-Procedure History & Physical: HPI:  Deborah Obrien is a 77 y.o. female is here for an endoscopy    Past Medical History:  Diagnosis Date  . A-fib ([place]) 2016  . Acid reflux   . Anxiety   . Bowel obstruction ([place])   . CHF (congestive heart failure) ([place]) 2017  . Chronic kidney disease (CKD) stage G3a/A1, moderately decreased glomerular filtration rate (GFR) between 45-59 mL/min/1.73 square meter and albuminuria creatinine ratio less than 30 mg/g (HCC)   . Diabetes mellitus without complication ([place])   . Dyspnea   . Fatty liver   . GI bleed   . Gout   . Hypertension   . Morbid obesity ([place])   . Osteoarthritis   . Osteoporosis   . Paroxysmal atrial fibrillation (HCC)   . Presence of permanent cardiac pacemaker   . Sinus node dysfunction ([place]) 08/24/2017  . TIA (transient ischemic attack)     Past Surgical History:  Procedure Laterality Date  . ABDOMINAL HYSTERECTOMY  1980  . APPENDECTOMY    . BACK SURGERY     2008  . BACK SURGERY  2019   patient describes kyphoplasty for compression fractures, MD referral said fusion  . BREAST BIOPSY Right 08/16   stereo fibroadenomatous change, neg for atypia  . BREAST EXCISIONAL BIOPSY Left    neg  . CARDIAC CATHETERIZATION Left 08/25/2016   Procedure: Left Heart Cath and Coronary Angiography;  Surgeon: Corey Skains, MD;  Location: [facility];  Service: Cardiovascular;  Laterality: Left;  . CARDIOVERSION N/A 06/28/2017   Procedure: CARDIOVERSION;  Surgeon: Corey Skains, MD;  Location: [facility];  Service: Cardiovascular;  Laterality: N/A;  . CARDIOVERSION N/A 02/06/2018   Procedure: CARDIOVERSION;  Surgeon: Josue Hector, MD;  Location: [facility];  Service: Cardiovascular;  Laterality:  N/A;  . CATARACT EXTRACTION, BILATERAL  2012  . CHOLECYSTECTOMY    . colon blockage  1999  . COLON SURGERY  1999  . COLONOSCOPY  2016   polyps removed 2016  . DORSAL COMPARTMENT RELEASE Left 09/14/2016   Procedure: RELEASE DORSAL COMPARTMENT (DEQUERVAIN);  Surgeon: Dereck Leep, MD;  Location: [facility];  Service: Orthopedics;  Laterality: Left;  . ESOPHAGOGASTRODUODENOSCOPY N/A 01/27/2015   Procedure: ESOPHAGOGASTRODUODENOSCOPY (EGD);  Surgeon: Lollie Sails, MD;  Location: [facility];  Service: Endoscopy;  Laterality: N/A;  . ESOPHAGOGASTRODUODENOSCOPY (EGD) WITH PROPOFOL N/A 07/24/2015   Procedure: ESOPHAGOGASTRODUODENOSCOPY (EGD) WITH PROPOFOL;  Surgeon: Lucilla Lame, MD;  Location: [facility];  Service: Endoscopy;  Laterality: N/A;  . ESOPHAGOGASTRODUODENOSCOPY (EGD) WITH PROPOFOL N/A 04/12/2018   Procedure: ESOPHAGOGASTRODUODENOSCOPY (EGD) WITH PROPOFOL;  Surgeon: Jonathon Bellows, MD;  Location: [facility] ENDOSCOPY;  Service: Gastroenterology;  Laterality: N/A;  . ESOPHAGOGASTRODUODENOSCOPY (EGD) WITH PROPOFOL N/A 06/22/2018   Procedure: ESOPHAGOGASTRODUODENOSCOPY (EGD) WITH PROPOFOL;  Surgeon: [name], MD;  Location: [facility] ENDOSCOPY;  Service: Endoscopy;  Laterality: N/A;  . EUS N/A 06/22/2018   Procedure: UPPER ENDOSCOPIC ULTRASOUND (EUS) RADIAL;  Surgeon: [name], MD;  Location: [facility] ENDOSCOPY;  Service: Endoscopy;  Laterality: N/A;  . JOINT REPLACEMENT  2014   Bilateral Knee replacement  . LEAD REVISION/REPAIR N/A 08/29/2017   Procedure: LEAD REVISION/REPAIR;  Surgeon: Constance Haw, MD;  Location: [facility];  Service:  Cardiovascular;  Laterality: N/A;  . PACEMAKER INSERTION Left 07/01/2015   Procedure: INSERTION PACEMAKER;  Surgeon: Isaias Cowman, MD;  Location: [facility];  Service: Cardiovascular;  Laterality: Left;  . PACEMAKER REVISION N/A 08/28/2017   Procedure: PACEMAKER REVISION;  Surgeon: Deboraha Sprang, MD;  Location: [facility];   Service: Cardiovascular;  Laterality: N/A;  . REPLACEMENT TOTAL KNEE BILATERAL    . SHOULDER ARTHROSCOPY WITH SUBACROMIAL DECOMPRESSION Left 2013  . TEE WITHOUT CARDIOVERSION N/A 06/28/2017   Procedure: TRANSESOPHAGEAL ECHOCARDIOGRAM (TEE);  Surgeon: Corey Skains, MD;  Location: [facility];  Service: Cardiovascular;  Laterality: N/A;    Prior to Admission medications   Medication Sig Start Date End Date Taking? Authorizing Provider  allopurinol (ZYLOPRIM) 100 MG tablet Take 150 mg by mouth daily.    Yes [provider]  amiodarone (PACERONE) 200 MG tablet Take 1 tablet (200 mg total) by mouth 2 (two) times daily. 05/10/18  Yes Deboraha Sprang, MD  benzocaine-Menthol (DERMOPLAST) 20-0.5 % AERO Apply 1 application topically 4 (four) times daily as needed for irritation.   Yes [provider]  busPIRone (BUSPAR) 10 MG tablet Take 10 mg by mouth 2 (two) times daily.    Yes [provider]  carboxymethylcellulose (REFRESH PLUS) 0.5 % SOLN 1 drop 2 (two) times daily as needed.   Yes [provider]  cholecalciferol (VITAMIN D) 1000 units tablet Take 1,000 Units by mouth daily.   Yes [provider]  dibucaine (NUPERCAINAL) 1 % OINT Place 1 application rectally as needed for hemorrhoids. 04/23/18  Yes Gouru, Illene Silver, MD  docusate sodium (COLACE) 100 MG capsule Take 100 mg by mouth at bedtime.    Yes [provider]  HYDROcodone-acetaminophen (NORCO/VICODIN) 5-325 MG tablet Take 1 tablet by mouth every 6 (six) hours as needed for moderate pain. 04/25/18 04/25/19 Yes Gerlene Fee, NP  insulin NPH-regular Human (NOVOLIN 70/30) (70-30) 100 UNIT/ML injection Inject 22 Units into the skin 2 (two) times daily.    Yes [provider]  Multiple Vitamin (MULTIVITAMIN WITH MINERALS) TABS tablet Take 1 tablet by mouth daily. 04/24/18  Yes Gouru, Illene Silver, MD  neomycin-bacitracin-polymyxin (NEOSPORIN) ointment Apply topically as needed for wound care.  04/23/18  Yes Gouru, Illene Silver, MD  oxybutynin (DITROPAN) 5 MG tablet Take 10 mg by mouth 2 (two) times daily.   Yes [provider]  pantoprazole (PROTONIX) 40 MG tablet Take 1 tablet (40 mg total) by mouth 2 (two) times daily. 12/29/17  Yes Darel Hong, MD  PARoxetine (PAXIL) 40 MG tablet Take 40 mg by mouth every morning.   Yes [provider]  senna-docusate (SENOKOT-S) 8.6-50 MG tablet Take 1 tablet by mouth every morning. 04/24/18  Yes Gouru, Aruna, MD  sucralfate (CARAFATE) 1 g tablet TAKE 1 TABLET BY MOUTH 4 TIMES DAILY. DISSOLVE IN WATER AND DRINK AS SLURRY 06/15/18  Yes [provider]  talc (ZEASORB) powder Apply 1 application topically as needed (under the breast irritation).   Yes [provider]  traZODone (DESYREL) 50 MG tablet Take 50-100 mg by mouth at bedtime.  06/07/18  Yes [provider]  acetaminophen (TYLENOL) 325 MG tablet Take 2 tablets (650 mg total) by mouth every 6 (six) hours as needed for mild pain (or Fever >/= 101). 04/23/18   Nicholes Mango, MD  apixaban (ELIQUIS) 5 MG TABS tablet Take 5 mg by mouth 2 (two) times daily.    [provider]  bisacodyl (DULCOLAX) 10 MG suppository Place 1  suppository (10 mg total) rectally daily as needed for moderate constipation. Patient not taking: Reported on 07/03/2018 04/23/18   Nicholes Mango, MD  colchicine 0.6 MG tablet Take 0.6 mg by mouth daily as needed (gout).     [provider]  dicyclomine (BENTYL) 10 MG capsule Take 1 capsule (10 mg total) by mouth 4 (four) times daily for 7 days. Patient not taking: Reported on 06/26/2018 06/21/18 06/28/18  Saundra Shelling, MD  ferrous sulfate 325 (65 FE) MG EC tablet Take 1 tablet (325 mg total) by mouth 2 (two) times daily. 04/23/18 05/23/18  Nicholes Mango, MD  furosemide (LASIX) 40 MG tablet Take 1 tablet (40 mg total) by mouth 2 (two) times daily. Patient not taking: Reported on 07/09/2018 04/23/18   Nicholes Mango, MD  hydrocortisone  2.5 % cream Apply 1 application topically daily as needed (rash).    [provider]  ipratropium-albuterol (DUONEB) 0.5-2.5 (3) MG/3ML SOLN Take 3 mLs by nebulization 2 (two) times daily. Patient not taking: Reported on 07/09/2018 04/23/18   Nicholes Mango, MD  ketoconazole (NIZORAL) 2 % cream Apply 1 application topically daily.    [provider]  lidocaine (LIDODERM) 5 % Place 1 patch onto the skin daily. Remove & Discard patch within 12 hours or as directed by MD Patient not taking: Reported on 07/03/2018 04/23/18   Nicholes Mango, MD  NON FORMULARY Diet Type: Mechanical Soft Chopped    [provider]  OXYGEN Inhale 2 L into the lungs continuous.    [provider]  Polyethyl Glycol-Propyl Glycol (SYSTANE) 0.4-0.3 % SOLN Place 1 drop into both eyes 2 (two) times daily as needed.    [provider]    Allergies as of 06/27/2018 - Review Complete 06/26/2018  Allergen Reaction Noted  . Cephalosporins Other (See Comments) 01/19/2015  . Fish allergy Shortness Of Breath 11/26/2015  . Macrolides and ketolides Other (See Comments) 01/12/2015  . Meperidine Shortness Of Breath, Nausea Only, and Other (See Comments) 01/19/2015  . Other Nausea Only, Rash, and Other (See Comments) 01/12/2015  . Prednisone Anaphylaxis and Other (See Comments) 01/19/2015  . Shellfish allergy Anaphylaxis, Shortness Of Breath, Diarrhea, Nausea Only, and Rash 06/29/2015  . Sulfa antibiotics Shortness Of Breath, Diarrhea, Nausea Only, and Other (See Comments) 06/25/2013  . Sulfacetamide sodium Diarrhea, Nausea Only, Other (See Comments), and Shortness Of Breath 08/12/2015  . Telbivudine  08/12/2015  . Uloric [febuxostat] Anaphylaxis 08/22/2016  . Aspirin Other (See Comments) 01/19/2015  . Celecoxib Other (See Comments) 01/19/2015  . Cephalexin Diarrhea, Nausea Only, and Other (See Comments) 01/19/2015  . Erythromycin Diarrhea, Nausea Only, and Other (See Comments) 01/19/2015  .  Hydromorphone Other (See Comments) 01/19/2015  . Iodinated diagnostic agents Rash and Other (See Comments) 01/19/2015  . Oxycodone Other (See Comments) 01/19/2015  . Atorvastatin Nausea Only and Other (See Comments) 01/19/2015  . Codeine Diarrhea, Nausea Only, and Other (See Comments) 01/19/2015  . Doxycycline  08/24/2017  . Tape Other (See Comments) 06/29/2015  . Valdecoxib Swelling and Other (See Comments) 01/19/2015  . Iodine Rash 06/29/2015  . Morphine and related Rash 06/14/2018    Family History  Problem Relation Age of Onset  . Diabetes Mellitus II Mother   . CAD Mother   . Heart attack Mother   . Cancer Father        skin  . Breast cancer Neg Hx     Social History   Socioeconomic History  . Marital status: Married    Spouse name:  Not on file  . Number of children: Not on file  . Years of education: Not on file  . Highest education level: Not on file  Occupational History  . Occupation: retired  Scientific laboratory technician  . Financial resource strain: Not on file  . Food insecurity:    Worry: Not on file    Inability: Not on file  . Transportation needs:    Medical: Not on file    Non-medical: Not on file  Tobacco Use  . Smoking status: Never Smoker  . Smokeless tobacco: Never Used  Substance and Sexual Activity  . Alcohol use: No  . Drug use: No  . Sexual activity: Yes  Lifestyle  . Physical activity:    Days per week: Not on file    Minutes per session: Not on file  . Stress: Not on file  Relationships  . Social connections:    Talks on phone: Not on file    Gets together: Not on file    Attends religious service: Not on file    Active member of club or organization: Not on file    Attends meetings of clubs or organizations: Not on file    Relationship status: Not on file  . Intimate partner violence:    Fear of current or ex partner: Not on file    Emotionally abused: Not on file    Physically abused: Not on file    Forced sexual activity: Not on file    Other Topics Concern  . Not on file  Social History Narrative  . Not on file    Review of Systems: See HPI, otherwise negative ROS  Physical Exam: BP (!) 153/82   Pulse 82   Temp (!) 96.2 F (35.7 C) (Tympanic)   Resp 18   Ht 5\' 4"  (1.626 m)   Wt 102.2 kg   SpO2 100%   BMI 38.69 kg/m  General:   Alert,  pleasant and cooperative in NAD Head:  Normocephalic and atraumatic. Neck:  Supple; no masses or thyromegaly. Lungs:  Clear throughout to auscultation, normal respiratory effort.    Heart:  +S1, +S2, Regular rate and rhythm, No edema. Abdomen:  Soft, nontender and nondistended. Normal bowel sounds, without guarding, and without rebound.   Neurologic:  Alert and  oriented x4;  grossly normal neurologically.  Impression/Plan: Deborah Obrien is here for an endoscopy  to be performed for  evaluation of gastric polyps for resection which caused a significant bleed previously.     Risks, benefits, limitations, and alternatives regarding endoscopy have been reviewed with the patient.  Questions have been answered.  All parties agreeable.   Jonathon Bellows, MD  07/09/2018, 10:45 AM

## 2018-07-10 ENCOUNTER — Encounter: Payer: Self-pay | Admitting: Gastroenterology

## 2018-07-10 ENCOUNTER — Ambulatory Visit: Payer: Medicare Other

## 2018-07-10 LAB — SURGICAL PATHOLOGY

## 2018-07-15 ENCOUNTER — Encounter: Payer: Self-pay | Admitting: Gastroenterology

## 2018-07-17 ENCOUNTER — Ambulatory Visit: Payer: Medicare Other | Admitting: Physical Therapy

## 2018-07-17 ENCOUNTER — Ambulatory Visit: Payer: Medicare Other

## 2018-07-19 ENCOUNTER — Ambulatory Visit: Payer: Medicare Other

## 2018-07-24 ENCOUNTER — Ambulatory Visit: Payer: Medicare Other

## 2018-07-26 ENCOUNTER — Encounter: Payer: Medicare Other | Admitting: Physical Therapy

## 2018-08-02 ENCOUNTER — Encounter: Payer: Self-pay | Admitting: Physical Therapy

## 2018-08-02 ENCOUNTER — Ambulatory Visit: Payer: Medicare Other | Attending: Orthopaedic Surgery | Admitting: Physical Therapy

## 2018-08-02 DIAGNOSIS — M545 Low back pain, unspecified: Secondary | ICD-10-CM

## 2018-08-02 DIAGNOSIS — R262 Difficulty in walking, not elsewhere classified: Secondary | ICD-10-CM | POA: Diagnosis present

## 2018-08-02 DIAGNOSIS — G8929 Other chronic pain: Secondary | ICD-10-CM | POA: Diagnosis present

## 2018-08-02 DIAGNOSIS — M6281 Muscle weakness (generalized): Secondary | ICD-10-CM | POA: Insufficient documentation

## 2018-08-02 NOTE — Therapy (Signed)
[facility] [address], [place], [address] Phone: [phone]   Fax:  [phone]  Physical Therapy Evaluation  Patient Details  Name: Deborah Obrien MRN: [id number] Date of Birth: 1940-10-25 Referring Provider (PT): Rennis Harding, MD   Encounter Date: 08/02/2018  PT End of Session - 08/02/18 1943    Visit Number  1    Number of Visits  17    Date for PT Re-Evaluation  09/27/18    Authorization Type  Medicare    Authorization Time Period  Current cert period: 82/50/5397 - 09/27/2018 (last PN: IE 08/02/2018)    Authorization - Visit Number  1    Authorization - Number of Visits  10    PT Start Time  1620    PT Stop Time  1700    PT Time Calculation (min)  40 min    Equipment Utilized During Treatment  Gait belt    Activity Tolerance  Patient limited by pain    Behavior During Therapy  [facility] for tasks assessed/performed       Past Medical History:  Diagnosis Date  . A-fib ([place]) 2016  . Acid reflux   . Anxiety   . Bowel obstruction ([place])   . CHF (congestive heart failure) ([place]) 2017  . Chronic kidney disease (CKD) stage G3a/A1, moderately decreased glomerular filtration rate (GFR) between 45-59 mL/min/1.73 square meter and albuminuria creatinine ratio less than 30 mg/g (HCC)   . Diabetes mellitus without complication ([place])   . Dyspnea   . Fatty liver   . GI bleed   . Gout   . Hypertension   . Morbid obesity ([place])   . Osteoarthritis   . Osteoporosis   . Paroxysmal atrial fibrillation (HCC)   . Presence of permanent cardiac pacemaker   . Sinus node dysfunction ([place]) 08/24/2017  . TIA (transient ischemic attack)     Past Surgical History:  Procedure Laterality Date  . ABDOMINAL HYSTERECTOMY  1980  . APPENDECTOMY    . BACK SURGERY     2008  . BACK SURGERY  2019   patient describes kyphoplasty for compression fractures, MD referral said fusion  . BREAST BIOPSY Right 08/16   stereo  fibroadenomatous change, neg for atypia  . BREAST EXCISIONAL BIOPSY Left    neg  . CARDIAC CATHETERIZATION Left 08/25/2016   Procedure: Left Heart Cath and Coronary Angiography;  Surgeon: Corey Skains, MD;  Location: [facility];  Service: Cardiovascular;  Laterality: Left;  . CARDIOVERSION N/A 06/28/2017   Procedure: CARDIOVERSION;  Surgeon: Corey Skains, MD;  Location: [facility];  Service: Cardiovascular;  Laterality: N/A;  . CARDIOVERSION N/A 02/06/2018   Procedure: CARDIOVERSION;  Surgeon: Josue Hector, MD;  Location: [facility];  Service: Cardiovascular;  Laterality: N/A;  . CATARACT EXTRACTION, BILATERAL  2012  . CHOLECYSTECTOMY    . colon blockage  1999  . COLON SURGERY  1999  . COLONOSCOPY  2016   polyps removed 2016  . DORSAL COMPARTMENT RELEASE Left 09/14/2016   Procedure: RELEASE DORSAL COMPARTMENT (DEQUERVAIN);  Surgeon: Dereck Leep, MD;  Location: [facility];  Service: Orthopedics;  Laterality: Left;  . ESOPHAGOGASTRODUODENOSCOPY N/A 01/27/2015   Procedure: ESOPHAGOGASTRODUODENOSCOPY (EGD);  Surgeon: Lollie Sails, MD;  Location: [facility]COPY;  Service: Endoscopy;  Laterality: N/A;  . ESOPHAGOGASTRODUODENOSCOPY (EGD) WITH PROPOFOL N/A 07/24/2015   Procedure: ESOPHAGOGASTRODUODENOSCOPY (EGD) WITH PROPOFOL;  Surgeon: Lucilla Lame, MD;  Location: [facility] ENDOSCOPY;  Service: Endoscopy;  Laterality: N/A;  . ESOPHAGOGASTRODUODENOSCOPY (EGD) WITH PROPOFOL N/A 04/12/2018   Procedure: ESOPHAGOGASTRODUODENOSCOPY (EGD) WITH PROPOFOL;  Surgeon: Jonathon Bellows, MD;  Location: [facility]COPY;  Service: Gastroenterology;  Laterality: N/A;  . ESOPHAGOGASTRODUODENOSCOPY (EGD) WITH PROPOFOL N/A 06/22/2018   Procedure: ESOPHAGOGASTRODUODENOSCOPY (EGD) WITH PROPOFOL;  Surgeon: [name], MD;  Location: [facility] ENDOSCOPY;  Service: Endoscopy;  Laterality: N/A;  . ESOPHAGOGASTRODUODENOSCOPY (EGD) WITH PROPOFOL N/A 07/09/2018   Procedure: ESOPHAGOGASTRODUODENOSCOPY (EGD) WITH  PROPOFOL with resection of gastric polyps;  Surgeon: Jonathon Bellows, MD;  Location: [facility] ENDOSCOPY;  Service: Gastroenterology;  Laterality: N/A;  . EUS N/A 06/22/2018   Procedure: UPPER ENDOSCOPIC ULTRASOUND (EUS) RADIAL;  Surgeon: [name], MD;  Location: [facility] ENDOSCOPY;  Service: Endoscopy;  Laterality: N/A;  . JOINT REPLACEMENT  2014   Bilateral Knee replacement  . LEAD REVISION/REPAIR N/A 08/29/2017   Procedure: LEAD REVISION/REPAIR;  Surgeon: Constance Haw, MD;  Location: [facility];  Service: Cardiovascular;  Laterality: N/A;  . PACEMAKER INSERTION Left 07/01/2015   Procedure: INSERTION PACEMAKER;  Surgeon: Isaias Cowman, MD;  Location: [facility];  Service: Cardiovascular;  Laterality: Left;  . PACEMAKER REVISION N/A 08/28/2017   Procedure: PACEMAKER REVISION;  Surgeon: Deboraha Sprang, MD;  Location: [facility];  Service: Cardiovascular;  Laterality: N/A;  . REPLACEMENT TOTAL KNEE BILATERAL    . SHOULDER ARTHROSCOPY WITH SUBACROMIAL DECOMPRESSION Left 2013  . TEE WITHOUT CARDIOVERSION N/A 06/28/2017   Procedure: TRANSESOPHAGEAL ECHOCARDIOGRAM (TEE);  Surgeon: Corey Skains, MD;  Location: [facility];  Service: Cardiovascular;  Laterality: N/A;     SUBJECTIVE: HISTORY:  Patient is a 77 y.o. female who presents to outpatient physical therapy with a referral for low back pain with request for pool therapy. She is being evaluated again due to being hospitalized for abdominal surgery since her last PT evaluation. This patient's chief complaints consist of pain, inability to stand up straight, poor activity tolerance following recently being dropped at rehab facility leading to 2 compression fractures that required kyphoplasty, leading to the following functional deficits: difficulty with ADLs, IADLs, bed mobility, transfers, household and community mobility, social and community participation.  Relevant past medical history and comorbidities include chronic  kidney disease (between stage III-IV according to pt), diabetes (last a1c 6.8), stomach polyp that was recently surgically repaired, obesity, Hx of B rotator cuff tears (with repairs), history of 2 strokes, pacemaker with revision, achy joints, bilateral knee replacement, gout, dypsnea, acid reflux, CHF, GI bleed, hypertension, afib w/history of cardioversion, osteoporosis, repeated hospitalization with decreased endurance and functional mobility, wide range of allergies. Recent surgical procedures (in 2019): cardioversion, esophagogastroduendoscopy with propofol (x2), back surgery (pt describes kyphoplasty at 2 levels, and she mentioned prior fusion), recent eye surgery.  See chart for extensive past medical history.  Imaging: patient reports recent radiographs in office showed narrowing between discs above kyphoplasty Response to previously administered skilled services: Hs not received PT since her abdominal surgery. Prior to that received home health PT with improvement in functional ability to be able to come to outpatient physical therapy. Reports difficulty tolerating PT due to pain.  Patient is returning to physical therapy after she was hospitalized for abdominal surgery to correct a bleeding polyp that was discovered recently. Pt reports she had laser eye surgery a week ago last Friday she had a problem with her right eye so she had laser surgery to clean the cataract implant. May need left side done. No restrictions at this point. States abdominal surgery went  well. She had a lot of pain that apparently was from her back. The surgeon was able to repair everything including a polyp larger than the one he knew about. The one that caused a big problem before was seeping blood. He took out two large ones and 3 medium ones. Left several small ones that he plans to address later. They attempted physical therapy at the hospital but she had to much pain she could not participtae She will be going back to the  surgeon next week for a check up. She just started eating normal foot. Feels like she is having difficulty swallowing. Reports no changes in medications since last PT visit, except a stomach coating that she cannot remember the name of.   Patient states she has a long history of back pain with a history of 3 other surgeries in the spine (one in the neck, and two in the low back). She reports has a plate and screws in her lumbar spine and neck from previous surgeries. She describes her most recent lumbar surgery as putting in a balloon to push the fracture back and adding cement. She agreed that it was a kyphoplasty, athough she has had fusions in the past. Surgical date is not available and patient cannot remember exactly. From available records it appears to be near the latter half of 05/2018 or early 06/2018.   Patient states her current acute condition started when she fell in SNF based rehab about April 23, 2018. She was at [facility] for rehab after a 23 day hospitalization for pneumonia and hemorrhaging from her stomach.  It was the first night she was there. She describes the incident as nursing using a lift (description resembled an EZ-stand type lift) to help her stand up to undress. She started sliding and strongly requested to be put down. She states she was abruptly lowered into the chair and instantly experienced severe back pain. She was then found to have two broken vertebrae (compression fractures) which lead to surgery. She states she requested to go home after her surgery instead of rehab. She has had some home health PT but she was in so much pain that her physician suggested pool therapy. She started PT with an evaluation but had to put a hold on PT until her recent abdominal surgery. She now returns to outpatient PT to get started again and participate in aquatic therapy. She reports litigation is involved for the episode at rehab at this point.  She states prior to fracturing her vertebrae  she was able to stand up straight, but now she cannot. She states she was told by the physician's office that she would probably not ever be able to stand up straight again, but that is one of her goals. She states she feels straighter when she first gets up but starts to "go down" as she is up and about.    FUNCTION Patient-reported Outcome Measure: deferred to future visit  PLOF (prior to hospitalization for pneumonia): ambulated indoors with hand held support on furniture. Ambulated short community distances but required seated rests in the grocery store or use of motorized scooter in grocery store. Required help with ADLs and IADLs.  Was able to stand up straight. Current Level of Function:  Assistance with ADLs and IADLs. Ambulates with cane inside house but reports near falls. Rollator outside of home short community distance. Has not tried stairs or steps. Requires help with ADLs (including dressing and bathing) and IADLs. Unable to maintain upright posture  in standing.    PAIN Location: low back, started more on right and radiates to left.  Nature: "constant" Pain Scale:  Patient reports current pain as 3/10, at best 3/10, at worst 12-13/10, and during functional activity 12-13/10. Paresthesia: left hand and was in right leg (present prior to back surgery). Aggravating factors: standing up, walking, getting up after prolonged sitting. Easing factors: sitting with back supported.    Falls in the last 6 months: 4-5 falls (last was in the rehab facility when she sustained spinal fractures).    There were no vitals filed for this visit.   Subjective Assessment - 08/02/18 1639    Subjective  Patient states her current acute condition started when she fell in SNF based rehab about April 23, 2018. She was at [facility] for rehab after a 23 day hospitalization for pneumonia and hemorrhaging from her stomach.  It was the first night she was there. She describes the incident as nursing  using a lift (description resembled an EZ-stand type lift) to help her stand up to undress. She started sliding and strongly requested to be put down. She states she was abruptly lowered into the chair and instantly experienced severe back pain. She was then found to have two broken vertebrae (compression fractures) which lead to surgery. She states she requested to go home after her surgery instead of rehab. She has had some home health PT but she was in so much pain that her physician suggested pool therapy. She started PT with an evaluation but had to put a hold on PT until her recent abdominal surgery. She now returns to outpatient PT to get started again and participate in aquatic therapy. She reports litigation is involved for the episode at rehab at this point.    Patient is accompained by:  Family member    Pertinent History  Patient is a 77 y.o. female who presents to outpatient physical therapy with a referral for low back pain with request for pool therapy. She is being evaluated again due to being hospitalized for abdominal surgery since her last PT evaluation. This patient's chief complaints consist of pain, inability to stand up straight, poor activity tolerance following recently being dropped at rehab facility leading to 2 compression fractures that required kyphoplasty, leading to the following functional deficits: difficulty with ADLs, IADLs, bed mobility, transfers, household and community mobility, social and community participation.  Relevant past medical history and comorbidities include chronic kidney disease (between stage III-IV according to pt), diabetes (last a1c 6.8), stomach polyp that was recently surgically repaired, obesity, Hx of B rotator cuff tears (with repairs), history of 2 strokes, pacemaker with revision, achy joints, bilateral knee replacement, gout, dypsnea, acid reflux, CHF, GI bleed, hypertension, afib w/history of cardioversion, osteoporosis, repeated hospitalization  with decreased endurance and functional mobility, wide range of allergies. Recent surgical procedures (in 2019): cardioversion, esophagogastroduendoscopy with propofol (x2), back surgery (pt describes kyphoplasty at 2 levels, and she mentioned prior fusion), recent eye surgery.  See chart for extensive past medical history.     Limitations  Sitting;Lifting;Standing;Walking;House hold activities   difficulty with ADLs, IADLs, bed mobility, transfers, household and community mobility, social and community participation.Marland Kitchen ambulates short household distances with rollator (~20 feet) in the community (stops every 20 feet to rest). Ambulates in househo   How long can you sit comfortably?  less than 15 min    How long can you stand comfortably?  0 min    How long can you walk comfortably?  0 min    Diagnostic tests  patient reports recent radiographs in office showed narrowing between discs above kyphoplasty    Patient Stated Goals  Be able to stand up straight, improve independence and pain level, improve endurance, get back to usual activities.     Currently in Pain?  Yes    Pain Score  5     Pain Location  Back    Pain Orientation  Mid;Lower    Pain Descriptors / Indicators  Constant    Pain Type  Chronic pain;Surgical pain    Pain Radiating Towards  radiates into left leg, causes numb/tingly feeling there.     Pain Onset  More than a month ago    Pain Frequency  Constant    Aggravating Factors   standing up, walking, getting up after prolonged sitting    Pain Relieving Factors  sitting supported, flexion orward and resting arms on table         [facility] PT Assessment - 08/02/18 0001      Assessment   Medical Diagnosis  low back pain    Referring Provider (PT)  Rennis Harding, MD    Onset Date/Surgical Date  --   unsure: mid September - early October; fall Apr 23, 2018?   Hand Dominance  Right    Next MD Visit  in december 2019    Prior Therapy  Home health PT      Precautions   Precautions   Fall   no heavy lifting   Required Braces or Orthoses  Other Brace/Splint   lumbar brace provided by MD but pt believes voluntary     Restrictions   Weight Bearing Restrictions  No      Balance Screen   Has the patient fallen in the past 6 months  Yes    How many times?  5    Has the patient had a decrease in activity level because of a fear of falling?   Yes    Is the patient reluctant to leave their home because of a fear of falling?   Yes      [place] residence    Living Arrangements  Spouse/significant other;Other (Comment)   dog   Available Help at Discharge  Family    Type of [place] entrance    [place]  One level    [place] - 2 wheels;Walker - 4 wheels;Cane - single point;Bedside commode;Shower seat;Toilet riser;Grab bars - toilet;Grab bars - tub/shower;Hand held shower head;Wheelchair - Education officer, community - power   sleeps on recliner     Prior Function   Level of Independence  Needs assistance with ADLs;Needs assistance with homemaking;Independent with household mobility with device;Independent with community mobility with device    Vocation  Retired    [employer]  ran a daycare previously    Leisure  ball games, traveled to see family (5 hours away), watch movies      Cognition   Overall Cognitive Status  Within Functional Limits for tasks assessed      Observation/Other Assessments   Observations  See note from 08/02/2018 for latest objective measures       OBJECTIVE: OBSERVATION/INSPECTION: Patient presents with increased thoracic kyphosis, lumbar flattening. Pt wearing lumbar brace up side down. Showed pt how to properly don it.   NEUROLOGICAL: Dermatomes: Left LE dermatomes decreased to light touch.  Myotomes: WNL except pain with  exertion of right hip.   SPINE MOTION Lumbar AROM:  Extension: = unable to reach neutral in standing.   Other motions not tested  due to high level of pain.  AROM limited by stiffness pain. Marland Kitchen  PERIPHERAL JOINT MOTION (AROM/PROM in degrees): - BLE grossly WFL, except lacking hip extension to stand up straight. Deferred specific testing to later date.  STRENGTH:  - BLE grossly 3+/5, specific hip extension and abduction testing deferred to later date.   FUNCTIONAL MOBILITY:  Bed mobility: not tested due to high level of pain and sleeping in recliner for years.  Transfers: sit <> stand from chair, SBA, push off with hands.  Gait: up to 30 feet SBA with rollator, required cuing to lock Rolator. Demo slow speed, wide stance, stooped posture, quick fatigue, dyspnea.   Stairs: not tested.   PRECAUTIONS:  Fall risk  Recent back surgery, no heavy lifting  Objective measurements completed on examination: See above findings.   TREATMENT:  Therapeutic exercise: to centralize symptoms and improve ROM and strength required for successful completion of functional activities.  - ambulated 30 feet x 3 with rollator, 10 feet x2 with SPC. Required SBA, required cuing to rest when fatigued, Demo slow speed, wide stance, stooped posture, quick fatigue, dyspnea.  - Education on diagnosis, prognosis, POC, anatomy and physiology of current condition.  - education on recommendation she use walker at all times in the home and does not leave it behind as demonstrated in clinic to avoid falls. Reviewed possible consequences of falls and pt agreed she should use walker.  - education on how to don lumbar brace.   Patient response to treatment:  Pt tolerated treatment fair. She had increased pain level with sitting and had great difficulty with ambulation by end of session due to increased pain levels. Objective exam was modified due to pain. Pt required cuing for safety when ambulating and transferring including cues to lock rollator and not leave it behind.      PT Education - 08/02/18 1942    Education Details  - Education on  diagnosis, prognosis, POC, anatomy and physiology of current condition. Instructions/info for aquatic therapy.  education on recommendation she use walker at all times in the home and does not leave it behind as demonstrated in clinic to avoid falls. Reviewed possible consequences of falls and pt agreed she should use walker.     Person(s) Educated  Patient    Methods  Explanation;Demonstration;Handout    Comprehension  Verbalized understanding;Returned demonstration       PT Short Term Goals - 08/02/18 1948      PT SHORT TERM GOAL #1   Title  Be independent with home exercise program completed at least 3 times per week for self-management of symptoms.    Baseline  currently performing HEP from home health PT    Time  4    Period  Weeks    Status  New    Target Date  08/30/18        PT Long Term Goals - 08/02/18 1948      PT LONG TERM GOAL #1   Title  Be independent with a long-term home exercise program for self-management of symptoms.     Baseline  requires more instruction, has not received final HEP (08/02/2018);    Time  8    Period  Weeks    Status  On-going    Target Date  09/27/18      PT LONG TERM  GOAL #2   Title  Patient will be able to ambulate > 200 feet mod I with LRAD for improved community mobility    Baseline  30 (08/02/2018);    Time  8    Period  Weeks    Status  On-going    Target Date  09/27/18      PT LONG TERM GOAL #3   Title  Reduce pain with functional activities to equal or less than 3/10 to allow patient to be more independent with usual activities including ADLs, IADLs, and social engagement with less difficulty.     Baseline  12/10 (08/02/2018);    Time  8    Period  Weeks    Status  On-going    Target Date  09/27/18      PT LONG TERM GOAL #4   Title  Be able to complete 5 sit to stand test in 30 seconds to improve functional mobility for transfers and community participation.     Baseline  Difficulty with one repetition, requires use of  hands (07/03/2018);    Time  6    Period  Weeks    Status  New      PT LONG TERM GOAL #5   Title  Patient will be able to ascend and decend 3 steps with BUE support and min A safely.     Baseline  Stairs not tested due to lack of activity tolerance (08/02/2018);    Time  8    Period  Weeks    Status  New    Target Date  09/27/18             Plan - 08/02/18 1945    Clinical Impression Statement  Patient is a 77 y.o. female referred to outpatient physical therapy with a diagnosis of low back pain with request for aquatic therapy (s/p lumbar surgery after being dropped causing 2 compression fractures) who presents with signs and symptoms consistent with low back pain s/p apparent kyphoplasty and generalized deconditioning with severe difficulty with functional activities    History and Personal Factors relevant to plan of care:  hronic kidney disease (between stage III-IV according to pt), diabetes (last a1c 6.8), stomach polyp that was recently surgically repaired, obesity, Hx of B rotator cuff tears (with repairs), history of 2 strokes, pacemaker with revision, achy joints, bilateral knee replacement, gout, dypsnea, acid reflux, CHF, GI bleed, hypertension, afib w/history of cardioversion, osteoporosis, repeated hospitalization with decreased endurance and functional mobility, wide range of allergies. Recent surgical procedures (in 2019): cardioversion, esophagogastroduendoscopy with propofol (x2), back surgery (pt describes kyphoplasty at 2 levels, and she mentioned prior fusion), recent eye surgery.     Clinical Presentation  Evolving    Clinical Presentation due to:  patient's condition continues to change due to ongoing health problems and comorbidities    Clinical Decision Making  Moderate    Rehab Potential  Fair    Clinical Impairments Affecting Rehab Potential  (+) patient motivation, spousal support; (-)  High fall risk, severity of symptoms, chronicity of functional impariment,  extensiveness of comorbidites.    PT Frequency  2x / week    PT Duration  8 weeks    PT Treatment/Interventions  ADLs/Self Care Home Management;Aquatic Therapy;Moist Heat;Cryotherapy;Gait training;Stair training;Functional mobility training;Therapeutic activities;Therapeutic exercise;Balance training;Neuromuscular re-education;Patient/family education;Wheelchair mobility training;Manual techniques;Passive range of motion;Energy conservation;Joint Manipulations   joint mobilizations grades I-V   PT Next Visit Plan  Progress funcitonal strengthening as tolerated.     PT Home  Exercise Plan  Continue HEP from HHPT    Consulted and Agree with Plan of Care  Patient;Family member/caregiver    Family Member Consulted  husband      ASSESSMENT:  Patient is a 77 y.o. female referred to outpatient physical therapy with a diagnosis of low back pain with request for aquatic therapy (s/p lumbar surgery after being dropped causing 2 compression fractures) who presents with signs and symptoms consistent with low back pain s/p apparent kyphoplasty and generalized deconditioning with severe difficulty with functional activities.   Patient would benefit from aquatic therapy due to effect of boyance that will reduce compression forces at the spine and allow her to move more freely and with less pain. She is fall risk will require close monitoring and assistance with preparation and participation in aquatic therapy.  Patient will benefit from skilled therapeutic intervention in order to improve the following deficits and impairments:  Abnormal gait, Decreased balance, Decreased endurance, Decreased mobility, Difficulty walking, Hypomobility, Increased muscle spasms, Impaired sensation, Cardiopulmonary status limiting activity, Decreased range of motion, Impaired perceived functional ability, Improper body mechanics, Obesity, Decreased activity tolerance, Decreased coordination, Decreased strength, Decreased safety  awareness, Impaired flexibility, Pain, Postural dysfunction  Visit Diagnosis: Chronic midline low back pain without sciatica  Difficulty in walking, not elsewhere classified  Muscle weakness (generalized)     Problem List Patient Active Problem List   Diagnosis Date Noted  . RUQ pain   . UTI (urinary tract infection) 06/14/2018  . Type 2 diabetes mellitus with neurological complications ([place]) 58/85/0277  . Acute respiratory failure ([place]) 04/09/2018  . Chronic anticoagulation 04/09/2018  . Pacemaker 04/09/2018  . H/O TIA (transient ischemic attack) and stroke 04/09/2018  . CAD (coronary artery disease) 04/09/2018  . Hematemesis 04/05/2018  . Hypotension 02/04/2018  . Atrial fibrillation with RVR ([place]) 02/04/2018  . Atrial flutter ([place]) 08/24/2017  . Sinus node dysfunction ([place]) 08/24/2017  . Dizziness 06/12/2017  . Mastalgia 02/14/2017  . Acute on chronic renal insufficiency 01/09/2017  . Fatty liver 01/09/2017  . Benign essential HTN 09/08/2016  . Nephrolithiasis 02/22/2016  . Diabetic neuropathy with neurologic complication ([place]) 41/28/7867  . Moderate episode of recurrent major depressive disorder ([place]) 08/11/2015  . Gastritis without bleeding   . Gastric polyp   . Gastro-esophageal reflux disease without esophagitis   . PAF (paroxysmal atrial fibrillation) ([place]) 06/29/2015  . Intestinal obstruction ([place]) 02/20/2015  . DDD (degenerative disc disease), lumbar 01/19/2015  . Back pain 01/12/2015  . Gout 01/12/2015  . Complete tear of left rotator cuff 10/16/2014  . Intervertebral disc disorder with radiculopathy of lumbar region 09/17/2014  . Lumbar radiculitis 09/17/2014  . Lumbar stenosis with neurogenic claudication 09/17/2014  . Neuritis or radiculitis due to rupture of lumbar intervertebral disc 09/17/2014  . Atherosclerosis of both carotid arteries 07/15/2014  . Essential hypertension 07/15/2014  . Hyperlipemia, mixed 07/15/2014  . Multiple-type hyperlipidemia  07/15/2014  . Body mass index (BMI) of 40.0-44.9 in adult ([place]) 06/21/2014  . Mixed incontinence 06/25/2013  . Mixed urge and stress incontinence 06/25/2013  . Nocturia 06/25/2013  . Blurring of visual image 04/11/2012    Nancy Nordmann, PT, DPT 08/02/2018, 7:55 PM  [facility] [address], [place], [address] Phone: [phone]   Fax:  [phone]  Name: Deborah Obrien MRN: [id number] Date of Birth: [birth year]

## 2018-08-06 ENCOUNTER — Ambulatory Visit
Admission: RE | Admit: 2018-08-06 | Discharge: 2018-08-06 | Disposition: A | Discharge status: Home / Self Care | Payer: Medicare Other | Source: Ambulatory Visit | Attending: Internal Medicine | Admitting: Internal Medicine

## 2018-08-06 ENCOUNTER — Encounter: Payer: Self-pay | Admitting: Gastroenterology

## 2018-08-06 ENCOUNTER — Ambulatory Visit (INDEPENDENT_AMBULATORY_CARE_PROVIDER_SITE_OTHER): Payer: Medicare Other | Admitting: Gastroenterology

## 2018-08-06 VITALS — BP 114/68 | HR 78 | Ht 64.0 in | Wt 222.4 lb

## 2018-08-06 DIAGNOSIS — K317 Polyp of stomach and duodenum: Secondary | ICD-10-CM

## 2018-08-06 DIAGNOSIS — Z1231 Encounter for screening mammogram for malignant neoplasm of breast: Secondary | ICD-10-CM | POA: Diagnosis present

## 2018-08-06 DIAGNOSIS — I209 Angina pectoris, unspecified: Secondary | ICD-10-CM

## 2018-08-06 NOTE — Progress Notes (Signed)
Jonathon Bellows MD, MRCP(U.K) [address]  [place]  [place], [place] [address]  Main: [phone]  Fax: [phone]   Primary Care Physician: [name], MD  Primary Gastroenterologist:  Dr. Jonathon Bellows   No chief complaint on file.   HPI: Deborah Obrien is a 77 y.o. female   Summary of history :  Deborah Obrien a 77 y.o.y/o femaleadmitted a few months back in the hospital with a severe upper GI bleed. I performed an upper endoscopy and noted a large gastric polyp that was bleeding. At that point of time I sprayed hemospray and obtained hemostasis. She is on Eliquis for atrial fibrillation and she has a history of strokes.   At her last office visit due to severe RUQ abdominal ;pain - was admitted , she is status post cholecystectomy. LFT's normal except elevated alk phos. She underwent an USG RUQ which showed a mildly dilated CBD 9 mm, she underwent an EUS at [facility] after and found no stones in her CBD and was discharged. She cannot have a MRCP due to a pacemaker.HIDA scan was normal .  Ct abdomen 06/14/18 showed no acute findings - atrophic pancreatitis. Features of prior kyphoplasty at t12- L1     Interval history   06/26/18-08/06/18   EGD 07/09/18 : EGD: 4 large gastric polyps were resected. All were hyperplastic , few other smaller polyps seen but not resected.   Today she is here for follow up .   Doing well , brown stool, no abdominal pains.    Current Outpatient Medications  Medication Sig Dispense Refill  . acetaminophen (TYLENOL) 325 MG tablet Take 2 tablets (650 mg total) by mouth every 6 (six) hours as needed for mild pain (or Fever >/= 101).    Marland Kitchen allopurinol (ZYLOPRIM) 100 MG tablet Take 150 mg by mouth daily.     Marland Kitchen amiodarone (PACERONE) 200 MG tablet Take 1 tablet (200 mg total) by mouth 2 (two) times daily. 60 tablet 3  . apixaban (ELIQUIS) 5 MG TABS tablet Take 5 mg by mouth 2 (two) times daily.    .  benzocaine-Menthol (DERMOPLAST) 20-0.5 % AERO Apply 1 application topically 4 (four) times daily as needed for irritation.    . bisacodyl (DULCOLAX) 10 MG suppository Place 1 suppository (10 mg total) rectally daily as needed for moderate constipation. (Patient not taking: Reported on 07/03/2018) 12 suppository 0  . busPIRone (BUSPAR) 10 MG tablet Take 10 mg by mouth 2 (two) times daily.     . carboxymethylcellulose (REFRESH PLUS) 0.5 % SOLN 1 drop 2 (two) times daily as needed.    . cholecalciferol (VITAMIN D) 1000 units tablet Take 1,000 Units by mouth daily.    . colchicine 0.6 MG tablet Take 0.6 mg by mouth daily as needed (gout).     . dibucaine (NUPERCAINAL) 1 % OINT Place 1 application rectally as needed for hemorrhoids.  0  . dicyclomine (BENTYL) 10 MG capsule Take 1 capsule (10 mg total) by mouth 4 (four) times daily for 7 days. (Patient not taking: Reported on 06/26/2018) 28 capsule 0  . docusate sodium (COLACE) 100 MG capsule Take 100 mg by mouth at bedtime.     . ferrous sulfate 325 (65 FE) MG EC tablet Take 1 tablet (325 mg total) by mouth 2 (two) times daily. 60 tablet 3  . furosemide (LASIX) 40 MG tablet Take 1 tablet (40 mg total) by mouth 2 (two) times daily. (Patient not taking: Reported on  07/09/2018) 30 tablet   . HYDROcodone-acetaminophen (NORCO/VICODIN) 5-325 MG tablet Take 1 tablet by mouth every 6 (six) hours as needed for moderate pain. 120 tablet 0  . hydrocortisone 2.5 % cream Apply 1 application topically daily as needed (rash).    . insulin NPH-regular Human (NOVOLIN 70/30) (70-30) 100 UNIT/ML injection Inject 22 Units into the skin 2 (two) times daily.     Marland Kitchen ipratropium-albuterol (DUONEB) 0.5-2.5 (3) MG/3ML SOLN Take 3 mLs by nebulization 2 (two) times daily. (Patient not taking: Reported on 07/09/2018) 360 mL   . ketoconazole (NIZORAL) 2 % cream Apply 1 application topically daily.    Marland Kitchen lidocaine (LIDODERM) 5 % Place 1 patch onto the skin daily. Remove & Discard patch  within 12 hours or as directed by MD (Patient not taking: Reported on 07/03/2018) 30 patch 0  . Multiple Vitamin (MULTIVITAMIN WITH MINERALS) TABS tablet Take 1 tablet by mouth daily.    Marland Kitchen neomycin-bacitracin-polymyxin (NEOSPORIN) ointment Apply topically as needed for wound care. 15 g 0  . NON FORMULARY Diet Type: Mechanical Soft Chopped    . oxybutynin (DITROPAN) 5 MG tablet Take 10 mg by mouth 2 (two) times daily.    . OXYGEN Inhale 2 L into the lungs continuous.    . pantoprazole (PROTONIX) 40 MG tablet Take 1 tablet (40 mg total) by mouth 2 (two) times daily. 60 tablet 0  . PARoxetine (PAXIL) 40 MG tablet Take 40 mg by mouth every morning.    Vladimir Faster Glycol-Propyl Glycol (SYSTANE) 0.4-0.3 % SOLN Place 1 drop into both eyes 2 (two) times daily as needed.    . senna-docusate (SENOKOT-S) 8.6-50 MG tablet Take 1 tablet by mouth every morning.    . sucralfate (CARAFATE) 1 g tablet TAKE 1 TABLET BY MOUTH 4 TIMES DAILY. DISSOLVE IN WATER AND DRINK AS SLURRY  1  . talc (ZEASORB) powder Apply 1 application topically as needed (under the breast irritation).    . traZODone (DESYREL) 50 MG tablet Take 50-100 mg by mouth at bedtime.      No current facility-administered medications for this visit.     Allergies as of 08/06/2018 - Review Complete 08/02/2018  Allergen Reaction Noted  . Cephalosporins Other (See Comments) 01/19/2015  . Fish allergy Shortness Of Breath 11/26/2015  . Macrolides and ketolides Other (See Comments) 01/12/2015  . Meperidine Shortness Of Breath, Nausea Only, and Other (See Comments) 01/19/2015  . Other Nausea Only, Rash, and Other (See Comments) 01/12/2015  . Prednisone Anaphylaxis and Other (See Comments) 01/19/2015  . Shellfish allergy Anaphylaxis, Shortness Of Breath, Diarrhea, Nausea Only, and Rash 06/29/2015  . Sulfa antibiotics Shortness Of Breath, Diarrhea, Nausea Only, and Other (See Comments) 06/25/2013  . Sulfacetamide sodium Diarrhea, Nausea Only, Other (See  Comments), and Shortness Of Breath 08/12/2015  . Telbivudine  08/12/2015  . Uloric [febuxostat] Anaphylaxis 08/22/2016  . Aspirin Other (See Comments) 01/19/2015  . Celecoxib Other (See Comments) 01/19/2015  . Cephalexin Diarrhea, Nausea Only, and Other (See Comments) 01/19/2015  . Erythromycin Diarrhea, Nausea Only, and Other (See Comments) 01/19/2015  . Hydromorphone Other (See Comments) 01/19/2015  . Iodinated diagnostic agents Rash and Other (See Comments) 01/19/2015  . Oxycodone Other (See Comments) 01/19/2015  . Aleve [naproxen]  07/03/2018  . Atorvastatin Nausea Only and Other (See Comments) 01/19/2015  . Codeine Diarrhea, Nausea Only, and Other (See Comments) 01/19/2015  . Dilaudid [hydromorphone hcl]  07/03/2018  . Doxycycline  08/24/2017  . Lipitor [atorvastatin calcium] Nausea Only 07/03/2018  . Motrin [  ibuprofen]  07/03/2018  . Tape Other (See Comments) 06/29/2015  . Valdecoxib Swelling and Other (See Comments) 01/19/2015  . Iodine Rash 06/29/2015    ROS:  General: Negative for anorexia, weight loss, fever, chills, fatigue, weakness. ENT: Negative for hoarseness, difficulty swallowing , nasal congestion. CV: Negative for chest pain, angina, palpitations, dyspnea on exertion, peripheral edema.  Respiratory: Negative for dyspnea at rest, dyspnea on exertion, cough, sputum, wheezing.  GI: See history of present illness. GU:  Negative for dysuria, hematuria, urinary incontinence, urinary frequency, nocturnal urination.  Endo: Negative for unusual weight change.    Physical Examination:   There were no vitals taken for this visit.  General: Well-nourished, well-developed in no acute distress.  Eyes: No icterus. Conjunctivae pink. Mouth: Oropharyngeal mucosa moist and pink , no lesions erythema or exudate. Lungs: Clear to auscultation bilaterally. Non-labored. Heart: Regular rate and rhythm, no murmurs rubs or gallops.  Abdomen: Bowel sounds are normal, nontender,  nondistended, no hepatosplenomegaly or masses, no abdominal bruits or hernia , no rebound or guarding.   Extremities: No lower extremity edema. No clubbing or deformities. Neuro: Alert and oriented x 3.  Grossly intact. Skin: Warm and dry, no jaundice.   Psych: Alert and cooperative, normal mood and affect.   Imaging Studies: No results found.  Assessment and Plan:   Deborah Obrien is a 77 y.o. y/o female here to follow up for gastric polyps which previously caused severe gi bleed requiring resection , on Eloquis. S/p EGD and resection of multiple gastric polyps. Doing well   Plan  1. EGD in 6-8 months to check for any other large residual polyps: will discuss at next visit  2. Check CBC today     Dr Jonathon Bellows  MD,MRCP [facility]) Follow up in 6 months

## 2018-08-07 LAB — CBC WITH DIFFERENTIAL/PLATELET
BASOS: 1 %
Basophils Absolute: 0 10*3/uL (ref 0.0–0.2)
EOS (ABSOLUTE): 0 10*3/uL (ref 0.0–0.4)
EOS: 1 %
HEMATOCRIT: 37.6 % (ref 34.0–46.6)
Hemoglobin: 12.2 g/dL (ref 11.1–15.9)
IMMATURE GRANULOCYTES: 0 %
Immature Grans (Abs): 0 10*3/uL (ref 0.0–0.1)
LYMPHS ABS: 1.4 10*3/uL (ref 0.7–3.1)
Lymphs: 28 %
MCH: 29.5 pg (ref 26.6–33.0)
MCHC: 32.4 g/dL (ref 31.5–35.7)
MCV: 91 fL (ref 79–97)
MONOS ABS: 0.4 10*3/uL (ref 0.1–0.9)
Monocytes: 7 %
NEUTROS ABS: 3.1 10*3/uL (ref 1.4–7.0)
NEUTROS PCT: 63 %
PLATELETS: 209 10*3/uL (ref 150–450)
RBC: 4.13 x10E6/uL (ref 3.77–5.28)
RDW: 14.4 % (ref 12.3–15.4)
WBC: 5 10*3/uL (ref 3.4–10.8)

## 2018-08-13 ENCOUNTER — Telehealth: Payer: Self-pay

## 2018-08-13 NOTE — Telephone Encounter (Signed)
-----   Message from Jonathon Bellows, MD sent at 08/07/2018 10:08 AM EST ----- Inform hb normal at 12.2 grams

## 2018-08-13 NOTE — Telephone Encounter (Signed)
Spoke with pt and informed her of CBC lab results.

## 2018-08-15 ENCOUNTER — Ambulatory Visit: Payer: Medicare Other | Admitting: Orthotics

## 2018-08-15 DIAGNOSIS — M2042 Other hammer toe(s) (acquired), left foot: Principal | ICD-10-CM

## 2018-08-15 DIAGNOSIS — M2041 Other hammer toe(s) (acquired), right foot: Secondary | ICD-10-CM

## 2018-08-15 DIAGNOSIS — E1149 Type 2 diabetes mellitus with other diabetic neurological complication: Secondary | ICD-10-CM

## 2018-08-15 DIAGNOSIS — E119 Type 2 diabetes mellitus without complications: Secondary | ICD-10-CM

## 2018-08-16 ENCOUNTER — Ambulatory Visit: Payer: Medicare Other

## 2018-08-17 NOTE — Progress Notes (Signed)
It was discovered AFTER casting, that [name] hasn't seen podiatrist Prudence Davidson) since 2018; patient will be called and asked to make an appointment with Dr. Prudence Davidson before we process order.

## 2018-08-20 ENCOUNTER — Telehealth: Payer: Self-pay | Admitting: Internal Medicine

## 2018-08-20 NOTE — Telephone Encounter (Signed)
Patient dropped off md signature page for medication assistance with Eliquis.  Placed in nurse box. Call when ready for pick up

## 2018-08-21 ENCOUNTER — Ambulatory Visit: Payer: Medicare Other | Attending: Orthopaedic Surgery

## 2018-08-21 DIAGNOSIS — M545 Low back pain: Secondary | ICD-10-CM | POA: Insufficient documentation

## 2018-08-21 DIAGNOSIS — M6281 Muscle weakness (generalized): Secondary | ICD-10-CM | POA: Insufficient documentation

## 2018-08-21 DIAGNOSIS — R262 Difficulty in walking, not elsewhere classified: Secondary | ICD-10-CM | POA: Insufficient documentation

## 2018-08-21 DIAGNOSIS — G8929 Other chronic pain: Secondary | ICD-10-CM | POA: Insufficient documentation

## 2018-08-21 NOTE — Telephone Encounter (Signed)
Form placed in Dr Olin Pia folder to sign next time he is in the office.

## 2018-08-23 ENCOUNTER — Other Ambulatory Visit: Payer: Self-pay

## 2018-08-23 ENCOUNTER — Ambulatory Visit: Payer: Medicare Other

## 2018-08-23 DIAGNOSIS — G8929 Other chronic pain: Secondary | ICD-10-CM | POA: Diagnosis present

## 2018-08-23 DIAGNOSIS — M6281 Muscle weakness (generalized): Secondary | ICD-10-CM

## 2018-08-23 DIAGNOSIS — M545 Low back pain, unspecified: Secondary | ICD-10-CM

## 2018-08-23 DIAGNOSIS — R262 Difficulty in walking, not elsewhere classified: Secondary | ICD-10-CM

## 2018-08-23 NOTE — Therapy (Signed)
[place] MAIN [facility] SERVICES [address] [place], [place], [address] Phone: [phone]   Fax:  [phone]  Physical Therapy Treatment  Patient Details  Name: Deborah Obrien MRN: [id number] Date of Birth: 1941-04-08 Referring Provider (PT): Rennis Harding, MD   Encounter Date: 08/23/2018  PT End of Session - 08/23/18 1520    Visit Number  2    Number of Visits  17    Date for PT Re-Evaluation  09/27/18    Authorization Type  Medicare    Authorization Time Period  Current cert period: 56/43/3295 - 09/27/2018 (last PN: IE 08/02/2018)    Authorization - Visit Number  2    Authorization - Number of Visits  10    PT Start Time  1215    PT Stop Time  1255    PT Time Calculation (min)  40 min    Activity Tolerance  Patient limited by pain       Past Medical History:  Diagnosis Date  . A-fib ([place]) 2016  . Acid reflux   . Anxiety   . Bowel obstruction ([place])   . CHF (congestive heart failure) ([place]) 2017  . Chronic kidney disease (CKD) stage G3a/A1, moderately decreased glomerular filtration rate (GFR) between 45-59 mL/min/1.73 square meter and albuminuria creatinine ratio less than 30 mg/g (HCC)   . Diabetes mellitus without complication ([place])   . Dyspnea   . Fatty liver   . GI bleed   . Gout   . Hypertension   . Morbid obesity ([place])   . Osteoarthritis   . Osteoporosis   . Paroxysmal atrial fibrillation (HCC)   . Presence of permanent cardiac pacemaker   . Sinus node dysfunction ([place]) 08/24/2017  . TIA (transient ischemic attack)     Past Surgical History:  Procedure Laterality Date  . ABDOMINAL HYSTERECTOMY  1980  . APPENDECTOMY    . BACK SURGERY     2008  . BACK SURGERY  2019   patient describes kyphoplasty for compression fractures, MD referral said fusion  . BREAST BIOPSY Right 08/16   stereo fibroadenomatous change, neg for atypia  . BREAST EXCISIONAL BIOPSY Left    neg  . CARDIAC CATHETERIZATION Left 08/25/2016    Procedure: Left Heart Cath and Coronary Angiography;  Surgeon: Corey Skains, MD;  Location: [place] [facility];  Service: Cardiovascular;  Laterality: Left;  . CARDIOVERSION N/A 06/28/2017   Procedure: CARDIOVERSION;  Surgeon: Corey Skains, MD;  Location: [facility] ORS;  Service: Cardiovascular;  Laterality: N/A;  . CARDIOVERSION N/A 02/06/2018   Procedure: CARDIOVERSION;  Surgeon: Josue Hector, MD;  Location: [place];  Service: Cardiovascular;  Laterality: N/A;  . CATARACT EXTRACTION, BILATERAL  2012  . CHOLECYSTECTOMY    . colon blockage  1999  . COLON SURGERY  1999  . COLONOSCOPY  2016   polyps removed 2016  . DORSAL COMPARTMENT RELEASE Left 09/14/2016   Procedure: RELEASE DORSAL COMPARTMENT (DEQUERVAIN);  Surgeon: Dereck Leep, MD;  Location: [facility] OR[facility];  Service: Orthopedics;  Laterality: Left;  . ESOPHAGOGASTRODUODENOSCOPY N/A 01/27/2015   Procedure: ESOPHAGOGASTRODUODENOSCOPY (EGD);  Surgeon: Lollie Sails, MD;  Location: [facility] ENDOSCOPY;  Service: Endoscopy;  Laterality: N/A;  . ESOPHAGOGASTRODUODENOSCOPY (EGD) WITH PROPOFOL N/A 07/24/2015   Procedure: ESOPHAGOGASTRODUODENOSCOPY (EGD) WITH PROPOFOL;  Surgeon: Lucilla Lame, MD;  Location: [facility] ENDOSCOPY;  Service: Endoscopy;  Laterality: N/A;  . ESOPHAGOGASTRODUODENOSCOPY (EGD) WITH PROPOFOL N/A 04/12/2018   Procedure: ESOPHAGOGASTRODUODENOSCOPY (EGD) WITH PROPOFOL;  Surgeon: Jonathon Bellows,  MD;  Location: [facility] ENDOSCOPY;  Service: Gastroenterology;  Laterality: N/A;  . ESOPHAGOGASTRODUODENOSCOPY (EGD) WITH PROPOFOL N/A 06/22/2018   Procedure: ESOPHAGOGASTRODUODENOSCOPY (EGD) WITH PROPOFOL;  Surgeon: [name], MD;  Location: [facility] ENDOSCOPY;  Service: Endoscopy;  Laterality: N/A;  . ESOPHAGOGASTRODUODENOSCOPY (EGD) WITH PROPOFOL N/A 07/09/2018   Procedure: ESOPHAGOGASTRODUODENOSCOPY (EGD) WITH PROPOFOL with resection of gastric polyps;  Surgeon: Jonathon Bellows, MD;  Location: [facility] ENDOSCOPY;  Service: Gastroenterology;   Laterality: N/A;  . EUS N/A 06/22/2018   Procedure: UPPER ENDOSCOPIC ULTRASOUND (EUS) RADIAL;  Surgeon: [name], MD;  Location: [facility] ENDOSCOPY;  Service: Endoscopy;  Laterality: N/A;  . JOINT REPLACEMENT  2014   Bilateral Knee replacement  . LEAD REVISION/REPAIR N/A 08/29/2017   Procedure: LEAD REVISION/REPAIR;  Surgeon: Constance Haw, MD;  Location: [place] [facility];  Service: Cardiovascular;  Laterality: N/A;  . OOPHORECTOMY    . PACEMAKER INSERTION Left 07/01/2015   Procedure: INSERTION PACEMAKER;  Surgeon: Isaias Cowman, MD;  Location: [facility] ORS;  Service: Cardiovascular;  Laterality: Left;  . PACEMAKER REVISION N/A 08/28/2017   Procedure: PACEMAKER REVISION;  Surgeon: Deboraha Sprang, MD;  Location: [place] [facility];  Service: Cardiovascular;  Laterality: N/A;  . REPLACEMENT TOTAL KNEE BILATERAL    . SHOULDER ARTHROSCOPY WITH SUBACROMIAL DECOMPRESSION Left 2013  . TEE WITHOUT CARDIOVERSION N/A 06/28/2017   Procedure: TRANSESOPHAGEAL ECHOCARDIOGRAM (TEE);  Surgeon: Corey Skains, MD;  Location: [facility] ORS;  Service: Cardiovascular;  Laterality: N/A;    There were no vitals filed for this visit.  Subjective Assessment - 08/23/18 1514    Subjective  Pt differentiates pain and pressure; initially stating she was not having pain, as she took 2 pain pills prior to session. Notes that she has some low back pressure, but tolerabe. Pt presents sitting in rollator without distress.     Patient is accompained by:  Family member    Pertinent History  Patient is a 77 y.o. female who presents to outpatient physical therapy with a referral for low back pain with request for pool therapy. She is being evaluated again due to being hospitalized for abdominal surgery since her last PT evaluation. This patient's chief complaints consist of pain, inability to stand up straight, poor activity tolerance following recently being dropped at rehab facility leading to 2 compression  fractures that required kyphoplasty, leading to the following functional deficits: difficulty with ADLs, IADLs, bed mobility, transfers, household and community mobility, social and community participation.  Relevant past medical history and comorbidities include chronic kidney disease (between stage III-IV according to pt), diabetes (last a1c 6.8), stomach polyp that was recently surgically repaired, obesity, Hx of B rotator cuff tears (with repairs), history of 2 strokes, pacemaker with revision, achy joints, bilateral knee replacement, gout, dypsnea, acid reflux, CHF, GI bleed, hypertension, afib w/history of cardioversion, osteoporosis, repeated hospitalization with decreased endurance and functional mobility, wide range of allergies. Recent surgical procedures (in 2019): cardioversion, esophagogastroduendoscopy with propofol (x2), back surgery (pt describes kyphoplasty at 2 levels, and she mentioned prior fusion), recent eye surgery.  See chart for extensive past medical history.       Transfer rollator to aquatic chair Dependent enter/exit via ramp  Side stepping at rail, chest to above chest deep water, 6 x 10 ft  LB stretch   Abdominal bracing   Single leg march; several ea side  Several mini squat  Static stand with abdominal brace  Seated in aquatic chair  very limited bicycle attempt, very slow  [facility]  Re attempt static stand  Not tolerating any attempts; demonstrating increasing discomfort/pressure with crying out in pain (worsened yet while pushing patient out of pool in aquatic w/c                         PT Education - 08/23/18 1518    Education provided  Yes    Education Details  Properties and benefits of water as it applies to exercise/activity. Core stabilization posture; attempted LB stretching at rail, [facility]    Person(s) Educated  Patient    Methods  Explanation;Tactile cues;Verbal cues    Comprehension  Verbalized understanding;Need further  instruction       PT Short Term Goals - 08/02/18 1948      PT SHORT TERM GOAL #1   Title  Be independent with home exercise program completed at least 3 times per week for self-management of symptoms.    Baseline  currently performing HEP from home health PT    Time  4    Period  Weeks    Status  New    Target Date  08/30/18        PT Long Term Goals - 08/02/18 1948      PT LONG TERM GOAL #1   Title  Be independent with a long-term home exercise program for self-management of symptoms.     Baseline  requires more instruction, has not received final HEP (08/02/2018);    Time  8    Period  Weeks    Status  On-going    Target Date  09/27/18      PT LONG TERM GOAL #2   Title  Patient will be able to ambulate > 200 feet mod I with LRAD for improved community mobility    Baseline  30 (08/02/2018);    Time  8    Period  Weeks    Status  On-going    Target Date  09/27/18      PT LONG TERM GOAL #3   Title  Reduce pain with functional activities to equal or less than 3/10 to allow patient to be more independent with usual activities including ADLs, IADLs, and social engagement with less difficulty.     Baseline  12/10 (08/02/2018);    Time  8    Period  Weeks    Status  On-going    Target Date  09/27/18      PT LONG TERM GOAL #4   Title  Be able to complete 5 sit to stand test in 30 seconds to improve functional mobility for transfers and community participation.     Baseline  Difficulty with one repetition, requires use of hands (07/03/2018);    Time  6    Period  Weeks    Status  New      PT LONG TERM GOAL #5   Title  Patient will be able to ascend and decend 3 steps with BUE support and min A safely.     Baseline  Stairs not tested due to lack of activity tolerance (08/02/2018);    Time  8    Period  Weeks    Status  New    Target Date  09/27/18            Plan - 08/23/18 1521    Clinical Impression Statement  Pt transferred from sitting in rollator to  aquatic chair without significant pain complaints. Pt entered water dependently in aquatic chair and immediately  upon water reaching pt's back began verbalizing/crying out with pain and grimaces/sighs/labored breathing in pain distress. Pt stated it was cold water touching her back and wished to continue. Initiated stand, gentle side stepping at rail with BUE support in chest deep to above chest deep water. Pt with increasing intolerance. Attempted leaning into LB stretches, gentle single side march/SKTC, seated static posture, SKTC and slow bicycle; none of which relieved pt's pressure in center of LB. Pt became increasingly distressed/shaking, but wishing to continue for nearly full session before agreeing to exit pool. Pt to see doctor (spine specialist) tomorrow. Pt to discuss with MD, but at this time, aquatic therapy was not beneficial as bouyancy did not alleviate any symptoms, but in fact in pt's words "made it worse". Proceed per MD/primary therapist recommendations. Would not expect pt to have increased pain in back in an aquatic environment with little more than stand/sitting in pool. Mild cohesive pressure would be the only force applied to trunk in the water and rare that this would elevate pain as it did causing pt to yell out in pain often.      Rehab Potential  Fair    Clinical Impairments Affecting Rehab Potential  (+) patient motivation, spousal support; (-)  High fall risk, severity of symptoms, chronicity of functional impariment, extensiveness of comorbidites.    PT Frequency  2x / week    PT Duration  8 weeks    PT Treatment/Interventions  ADLs/Self Care Home Management;Aquatic Therapy;Moist Heat;Cryotherapy;Gait training;Stair training;Functional mobility training;Therapeutic activities;Therapeutic exercise;Balance training;Neuromuscular re-education;Patient/family education;Wheelchair mobility training;Manual techniques;Passive range of motion;Energy conservation;Joint Manipulations    joint mobilizations grades I-V   PT Next Visit Plan  Progress funcitonal strengthening as tolerated.     PT Home Exercise Plan  Continue HEP from HHPT    Consulted and Agree with Plan of Care  Patient;Family member/caregiver    Family Member Consulted  husband       Patient will benefit from skilled therapeutic intervention in order to improve the following deficits and impairments:  Abnormal gait, Decreased balance, Decreased endurance, Decreased mobility, Difficulty walking, Hypomobility, Increased muscle spasms, Impaired sensation, Cardiopulmonary status limiting activity, Decreased range of motion, Impaired perceived functional ability, Improper body mechanics, Obesity, Decreased activity tolerance, Decreased coordination, Decreased strength, Decreased safety awareness, Impaired flexibility, Pain, Postural dysfunction  Visit Diagnosis: Chronic midline low back pain without sciatica  Difficulty in walking, not elsewhere classified  Muscle weakness (generalized)  Acute low back pain without sciatica, unspecified back pain laterality     Problem List Patient Active Problem List   Diagnosis Date Noted  . RUQ pain   . UTI (urinary tract infection) 06/14/2018  . Type 2 diabetes mellitus with neurological complications ([place]) 46/65/9935  . Acute respiratory failure ([place]) 04/09/2018  . Chronic anticoagulation 04/09/2018  . Pacemaker 04/09/2018  . H/O TIA (transient ischemic attack) and stroke 04/09/2018  . CAD (coronary artery disease) 04/09/2018  . Hematemesis 04/05/2018  . Hypotension 02/04/2018  . Atrial fibrillation with RVR ([place]) 02/04/2018  . Atrial flutter ([place]) 08/24/2017  . Sinus node dysfunction ([place]) 08/24/2017  . Dizziness 06/12/2017  . Mastalgia 02/14/2017  . Acute on chronic renal insufficiency 01/09/2017  . Fatty liver 01/09/2017  . Benign essential HTN 09/08/2016  . Nephrolithiasis 02/22/2016  . Diabetic neuropathy with neurologic complication ([place]) 70/17/7939   . Moderate episode of recurrent major depressive disorder ([place]) 08/11/2015  . Gastritis without bleeding   . Gastric polyp   . Gastro-esophageal reflux disease without esophagitis   .  PAF (paroxysmal atrial fibrillation) ([place]) 06/29/2015  . Intestinal obstruction ([place]) 02/20/2015  . DDD (degenerative disc disease), lumbar 01/19/2015  . Back pain 01/12/2015  . Gout 01/12/2015  . Complete tear of left rotator cuff 10/16/2014  . Intervertebral disc disorder with radiculopathy of lumbar region 09/17/2014  . Lumbar radiculitis 09/17/2014  . Lumbar stenosis with neurogenic claudication 09/17/2014  . Neuritis or radiculitis due to rupture of lumbar intervertebral disc 09/17/2014  . Atherosclerosis of both carotid arteries 07/15/2014  . Essential hypertension 07/15/2014  . Hyperlipemia, mixed 07/15/2014  . 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 Body mass index (BMI) of 40.0-44.9 in adult ([place]) 06/21/2014  . Mixed incontinence 06/25/2013  . Mixed urge and stress incontinence 06/25/2013  . Nocturia 06/25/2013  . Blurring of visual image 04/11/2012    Larae Grooms 08/23/2018, 3:36 PM  [place] MAIN [facility] SERVICES [address] [place], [place], [address] Phone: [phone]   Fax:  [phone]  Name: SABRIEL BORROMEO MRN: [id number] Date of Birth: [birth year]

## 2018-08-27 ENCOUNTER — Ambulatory Visit: Payer: Medicare Other | Admitting: Physical Therapy

## 2018-08-28 ENCOUNTER — Encounter: Payer: Medicare Other | Admitting: Physical Therapy

## 2018-08-28 ENCOUNTER — Ambulatory Visit (INDEPENDENT_AMBULATORY_CARE_PROVIDER_SITE_OTHER): Payer: Medicare Other

## 2018-08-28 ENCOUNTER — Ambulatory Visit (INDEPENDENT_AMBULATORY_CARE_PROVIDER_SITE_OTHER): Payer: Medicare Other | Admitting: Internal Medicine

## 2018-08-28 ENCOUNTER — Other Ambulatory Visit
Admission: RE | Admit: 2018-08-28 | Discharge: 2018-08-28 | Disposition: A | Discharge status: Home / Self Care | Payer: Medicare Other | Attending: Internal Medicine | Admitting: Internal Medicine

## 2018-08-28 VITALS — BP 104/60 | HR 70 | Ht 64.5 in | Wt 221.0 lb

## 2018-08-28 DIAGNOSIS — Z79899 Other long term (current) drug therapy: Secondary | ICD-10-CM

## 2018-08-28 DIAGNOSIS — I480 Paroxysmal atrial fibrillation: Secondary | ICD-10-CM | POA: Diagnosis present

## 2018-08-28 DIAGNOSIS — I495 Sick sinus syndrome: Secondary | ICD-10-CM

## 2018-08-28 DIAGNOSIS — Z950 Presence of cardiac pacemaker: Secondary | ICD-10-CM | POA: Diagnosis not present

## 2018-08-28 DIAGNOSIS — I209 Angina pectoris, unspecified: Secondary | ICD-10-CM

## 2018-08-28 LAB — COMPREHENSIVE METABOLIC PANEL
ALBUMIN: 3.9 g/dL (ref 3.5–5.0)
ALK PHOS: 120 U/L (ref 38–126)
ALT: 23 U/L (ref 0–44)
ANION GAP: 11 (ref 5–15)
AST: 41 U/L (ref 15–41)
BILIRUBIN TOTAL: 1 mg/dL (ref 0.3–1.2)
BUN: 33 mg/dL — AB (ref 8–23)
CALCIUM: 9.2 mg/dL (ref 8.9–10.3)
CO2: 26 mmol/L (ref 22–32)
Chloride: 98 mmol/L (ref 98–111)
Creatinine, Ser: 2.12 mg/dL — ABNORMAL HIGH (ref 0.44–1.00)
GFR calc Af Amer: 25 mL/min — ABNORMAL LOW (ref >60)
GFR calc non Af Amer: 22 mL/min — ABNORMAL LOW (ref >60)
Glucose, Bld: 190 mg/dL — ABNORMAL HIGH (ref 70–99)
Potassium: 4.1 mmol/L (ref 3.5–5.1)
SODIUM: 135 mmol/L (ref 135–145)
TOTAL PROTEIN: 7.5 g/dL (ref 6.5–8.1)

## 2018-08-28 LAB — TSH: TSH: 10.673 u[IU]/mL — ABNORMAL HIGH (ref 0.350–4.500)

## 2018-08-28 MED ORDER — APIXABAN 5 MG PO TABS: 5.0000 mg | ORAL_TABLET | Freq: Two times a day (BID) | ORAL | 3 refills | Status: DC

## 2018-08-28 NOTE — Addendum Note (Signed)
Addended by: Alvis Lemmings C on: 08/28/2018 10:52 AM   Modules accepted: Orders

## 2018-08-28 NOTE — Telephone Encounter (Signed)
Patient seen in clinic today with [name]. Physician portion of the patient assistance forms for eliquis were completed by [name] as well as a signed RX for eliquis. These were given to the patient to submit with her portion of the patient assistance form.

## 2018-08-28 NOTE — Patient Instructions (Addendum)
Medication Instructions:  - Your physician recommends that you continue on your current medications as directed. Please refer to the Current Medication list given to you today.  If you need a refill on your cardiac medications before your next appointment, please call your pharmacy.   Lab work: - Your physician recommends that you have lab work today: TSH  If you have labs (blood work) drawn today and your tests are completely normal, you will receive your results only by: Marland Kitchen MyChart Message (if you have MyChart) OR . A paper copy in the mail If you have any lab test that is abnormal or we need to change your treatment, we will call you to review the results.  Testing/Procedures: - none ordered  Follow-Up: At [facility], you and your health needs are our priority.  As part of our continuing mission to provide you with exceptional heart care, we have created designated Provider Care Teams.  These Care Teams include your primary Cardiologist (physician) and Advanced Practice Providers (APPs -  Physician Assistants and Nurse Practitioners) who all work together to provide you with the care you need, when you need it. . You will need a follow up appointment in 6 months with [name].  Please call our office 2 months in advance to schedule this appointment.    Remote monitoring is used to monitor your Pacemaker of ICD from home. This monitoring reduces the number of office visits required to check your device to one time per year. It allows [place] to keep an eye on the functioning of your device to ensure it is working properly. You are scheduled for a device check from home on 11/27/18. You may send your transmission at any time that day. If you have a wireless device, the transmission will be sent automatically. After your physician reviews your transmission, you will receive a postcard with your next transmission date.   Any Other Special Instructions Will Be Listed Below (If Applicable). - Your  paperwork for patient assistance for Eliquis & prescription to submit with this has been completed by [name] and given to you today

## 2018-08-28 NOTE — Progress Notes (Signed)
Patient Care Team: [name], MD as PCP - General (Internal Medicine) Deboraha Sprang, MD as PCP - Cardiology (Cardiology) [name], MD (Internal Medicine) Bary Castilla Forest Gleason, MD (General Surgery)   HPI  Deborah Obrien is a 77 y.o. female Seen for sinus node dysfunction exercise intolerance and previously implanted single chamber pacemaker for which she underwent dual chamber upgrade 12/18 cx by encircling of the chronic RV lead by the atrial lead resulting in tension for which she underwent RA lead revision   She has hx of atrial arrhythmias, with atrial fib and flutter, typical/atypical all documented.     Admitted 5/19 with recurrent atrial flutter complicated by congestive heart failure.  She was restarted on amiodarone (having been discontinued 12/18) and discharged on a diuretic.  Hospitalized for most of the month of August because of GI bleeding.  She has a residual GI polyp Reusch polypectomy is anticipated but currently deferred.  Recurrent atrial flutter 9/19 pace terminated in the office.  Significant orthostatic intolerance further complicated by orthostatic dyspnea.      Biggest issue has been his back pain which is unrelenting and no narcotic requiring.  It limits her ambulation to less than 10-20 feet  No dyspnea; no obvious bleeding  Struggling with anorexia.  Has lost 85 pounds.  Amiodarone precedes this by many years   11/16  Echo  EF 55-60   12/17 Echo  EF 45% Stress neg  12/17 Cath  Nonobstructive CAD  Mr 2+  2/19 Myoview EF 55-65% No ischemia  7/19 Echo  EF 60-65% Mild RVE     Date Cr K Hgb TSH LFTs  10/18 1.72  13.5    12/18 1.32  13.5    6/19 1.57 5.5>>4.0 13.7 2.95 71(5/19  8/19 1.53 3.8 11.8>>12.7  14  10/19   12.2 (11/19)  20             Past Medical History:  Diagnosis Date  . A-fib ([place]) 2016  . Acid reflux   . Anxiety   . Bowel obstruction ([place])   . CHF (congestive heart failure) ([place]) 2017  .  Chronic kidney disease (CKD) stage G3a/A1, moderately decreased glomerular filtration rate (GFR) between 45-59 mL/min/1.73 square meter and albuminuria creatinine ratio less than 30 mg/g (HCC)   . Diabetes mellitus without complication ([place])   . Dyspnea   . Fatty liver   . GI bleed   . Gout   . Hypertension   . Morbid obesity ([place])   . Osteoarthritis   . Osteoporosis   . Paroxysmal atrial fibrillation (HCC)   . Presence of permanent cardiac pacemaker   . Sinus node dysfunction ([place]) 08/24/2017  . TIA (transient ischemic attack)     Past Surgical History:  Procedure Laterality Date  . ABDOMINAL HYSTERECTOMY  1980  . APPENDECTOMY    . BACK SURGERY     2008  . BACK SURGERY  2019   patient describes kyphoplasty for compression fractures, MD referral said fusion  . BREAST BIOPSY Right 08/16   stereo fibroadenomatous change, neg for atypia  . BREAST EXCISIONAL BIOPSY Left    neg  . CARDIAC CATHETERIZATION Left 08/25/2016   Procedure: Left Heart Cath and Coronary Angiography;  Surgeon: Corey Skains, MD;  Location: [place] [facility];  Service: Cardiovascular;  Laterality: Left;  . CARDIOVERSION N/A 06/28/2017   Procedure: CARDIOVERSION;  Surgeon: Corey Skains, MD;  Location: [facility];  Service: Cardiovascular;  Laterality: N/A;  . CARDIOVERSION N/A 02/06/2018   Procedure: CARDIOVERSION;  Surgeon: Josue Hector, MD;  Location: [place];  Service: Cardiovascular;  Laterality: N/A;  . CATARACT EXTRACTION, BILATERAL  2012  . CHOLECYSTECTOMY    . colon blockage  1999  . COLON SURGERY  1999  . COLONOSCOPY  2016   polyps removed 2016  . DORSAL COMPARTMENT RELEASE Left 09/14/2016   Procedure: RELEASE DORSAL COMPARTMENT (DEQUERVAIN);  Surgeon: Dereck Leep, MD;  Location: [facility];  Service: Orthopedics;  Laterality: Left;  . ESOPHAGOGASTRODUODENOSCOPY N/A 01/27/2015   Procedure: ESOPHAGOGASTRODUODENOSCOPY (EGD);  Surgeon: Lollie Sails, MD;  Location: [facility];   Service: Endoscopy;  Laterality: N/A;  . ESOPHAGOGASTRODUODENOSCOPY (EGD) WITH PROPOFOL N/A 07/24/2015   Procedure: ESOPHAGOGASTRODUODENOSCOPY (EGD) WITH PROPOFOL;  Surgeon: Lucilla Lame, MD;  Location: [facility]OS[facility];  Service: Endoscopy;  Laterality: N/A;  . ESOPHAGOGASTRODUODENOSCOPY (EGD) WITH PROPOFOL N/A 04/12/2018   Procedure: ESOPHAGOGASTRODUODENOSCOPY (EGD) WITH PROPOFOL;  Surgeon: Jonathon Bellows, MD;  Location: [facility]COPY;  Service: Gastroenterology;  Laterality: N/A;  . ESOPHAGOGASTRODUODENOSCOPY (EGD) WITH PROPOFOL N/A 06/22/2018   Procedure: ESOPHAGOGASTRODUODENOSCOPY (EGD) WITH PROPOFOL;  Surgeon: [name], MD;  Location[facility];  Service: Endoscopy;  Laterality: N/A;  . ESOPHAGOGASTRODUODENOSCOPY (EGD) WITH PROPOFOL N/A 07/09/2018   Procedure: ESOPHAGOGASTRODUODENOSCOPY (EGD) WITH PROPOFOL with resection of gastric polyps;  Surgeon: Jonathon Bellows, MD;  Location: [facility] ENDOSCOPY;  Service: Gastroenterology;  Laterality: N/A;  . EUS N/A 06/22/2018   Procedure: UPPER ENDOSCOPIC ULTRASOUND (EUS) RADIAL;  Surgeon: [name], MD;  Location: [facility] ENDOSCOPY;  Service: Endoscopy;  Laterality: N/A;  . JOINT REPLACEMENT  2014   Bilateral Knee replacement  . LEAD REVISION/REPAIR N/A 08/29/2017   Procedure: LEAD REVISION/REPAIR;  Surgeon: Constance Haw, MD;  Location: [place] [facility];  Service: Cardiovascular;  Laterality: N/A;  . OOPHORECTOMY    . PACEMAKER INSERTION Left 07/01/2015   Procedure: INSERTION PACEMAKER;  Surgeon: Isaias Cowman, MD;  Location: [facility];  Service: Cardiovascular;  Laterality: Left;  . PACEMAKER REVISION N/A 08/28/2017   Procedure: PACEMAKER REVISION;  Surgeon: Deboraha Sprang, MD;  Location: [place] [facility];  Service: Cardiovascular;  Laterality: N/A;  . REPLACEMENT TOTAL KNEE BILATERAL    . SHOULDER ARTHROSCOPY WITH SUBACROMIAL DECOMPRESSION Left 2013  . TEE WITHOUT CARDIOVERSION N/A 06/28/2017   Procedure: TRANSESOPHAGEAL  ECHOCARDIOGRAM (TEE);  Surgeon: Corey Skains, MD;  Location: [facility];  Service: Cardiovascular;  Laterality: N/A;    Current Outpatient Medications  Medication Sig Dispense Refill  . acetaminophen (TYLENOL) 325 MG tablet Take 2 tablets (650 mg total) by mouth every 6 (six) hours as needed for mild pain (or Fever >/= 101).    Marland Kitchen allopurinol (ZYLOPRIM) 100 MG tablet Take 150 mg by mouth daily.     Marland Kitchen amiodarone (PACERONE) 200 MG tablet Take 1 tablet (200 mg total) by mouth 2 (two) times daily. 60 tablet 3  . apixaban (ELIQUIS) 5 MG TABS tablet Take 5 mg by mouth 2 (two) times daily.    . benzocaine-Menthol (DERMOPLAST) 20-0.5 % AERO Apply 1 application topically 4 (four) times daily as needed for irritation.    . bisacodyl (DULCOLAX) 10 MG suppository Place 1 suppository (10 mg total) rectally daily as needed for moderate constipation. 12 suppository 0  . busPIRone (BUSPAR) 10 MG tablet Take 10 mg by mouth 2 (two) times daily.     . carboxymethylcellulose (REFRESH PLUS) 0.5 % SOLN 1 drop 2 (two) times daily as needed.    Marland Kitchen  colchicine 0.6 MG tablet Take 0.6 mg by mouth daily as needed (gout).     . dibucaine (NUPERCAINAL) 1 % OINT Place 1 application rectally as needed for hemorrhoids.  0  . furosemide (LASIX) 40 MG tablet Take 1 tablet (40 mg total) by mouth 2 (two) times daily. 30 tablet   . HYDROcodone-acetaminophen (NORCO/VICODIN) 5-325 MG tablet Take 1 tablet by mouth every 6 (six) hours as needed for moderate pain. 120 tablet 0  . hydrocortisone 2.5 % cream Apply 1 application topically daily as needed (rash).    . insulin NPH-regular Human (NOVOLIN 70/30) (70-30) 100 UNIT/ML injection Inject 22 Units into the skin 2 (two) times daily.     Marland Kitchen ipratropium-albuterol (DUONEB) 0.5-2.5 (3) MG/3ML SOLN Take 3 mLs by nebulization 2 (two) times daily. 360 mL   . neomycin-bacitracin-polymyxin (NEOSPORIN) ointment Apply topically as needed for wound care. 15 g 0  . NON FORMULARY Diet Type:  Mechanical Soft Chopped    . oxybutynin (DITROPAN) 5 MG tablet Take 10 mg by mouth 2 (two) times daily.    . OXYGEN Inhale 2 L into the lungs continuous.    . pantoprazole (PROTONIX) 40 MG tablet Take 1 tablet (40 mg total) by mouth 2 (two) times daily. 60 tablet 0  . PARoxetine (PAXIL) 40 MG tablet Take 40 mg by mouth every morning.    Vladimir Faster Glycol-Propyl Glycol (SYSTANE) 0.4-0.3 % SOLN Place 1 drop into both eyes 2 (two) times daily as needed.    . pravastatin (PRAVACHOL) 20 MG tablet Take 20 mg by mouth every evening.  2  . senna-docusate (SENOKOT-S) 8.6-50 MG tablet Take 1 tablet by mouth every morning.    . sucralfate (CARAFATE) 1 g tablet TAKE 1 TABLET BY MOUTH 4 TIMES DAILY. DISSOLVE IN WATER AND DRINK AS SLURRY  1  . talc (ZEASORB) powder Apply 1 application topically as needed (under the breast irritation).    . traZODone (DESYREL) 50 MG tablet Take 50-100 mg by mouth at bedtime.     . ferrous sulfate 325 (65 FE) MG EC tablet Take 1 tablet (325 mg total) by mouth 2 (two) times daily. 60 tablet 3   No current facility-administered medications for this visit.     Allergies  Allergen Reactions  . Cephalosporins Other (See Comments)    Reaction:  Unknown   . Fish Allergy Shortness Of Breath  . Macrolides And Ketolides Other (See Comments)    Reaction:  Unknown  Other Reaction: Intolerance Other Reaction: Intolerance   . Meperidine Shortness Of Breath, Nausea Only and Other (See Comments)    Reaction:  Stomach pain   . Other Nausea Only, Rash and Other (See Comments)    Uncoded Allergy. Allergen: NON-STEROIDS, Other Reaction: Not Assessed bextra - hands and feet swell Shellfish - SOB Shellfish - SOB Other Reaction: Not Assessed  . Prednisone Anaphylaxis and Other (See Comments)    Reaction:  Increases pts blood sugar  Pt states that she is allergic to all steroids.    . Shellfish Allergy Anaphylaxis, Shortness Of Breath, Diarrhea, Nausea Only and Rash    Reaction:   Stomach pain  Other reaction(s): Other (See Comments) Reaction:Stomach pain   . Sulfa Antibiotics Shortness Of Breath, Diarrhea, Nausea Only and Other (See Comments)    Reaction:  Stomach pain  Reaction:Stomach pain  Other Reaction: Intolerance   . Sulfacetamide Sodium Diarrhea, Nausea Only, Other (See Comments) and Shortness Of Breath    Reaction:  Stomach pain   .  Telbivudine     Other reaction(s): Other (See Comments) Reaction:Unknown   . Uloric [Febuxostat] Anaphylaxis    Locks pt's body up   . Aspirin Other (See Comments)    Reaction:  Stomach pain   . Celecoxib Other (See Comments)    Reaction:  GI bleed, weakness, and stomach pain.    . Cephalexin Diarrhea, Nausea Only and Other (See Comments)    Reaction:  Stomach pain   . Erythromycin Diarrhea, Nausea Only and Other (See Comments)    Reaction:  Stomach pain   . Hydromorphone Other (See Comments)    Reaction:  Hypotension   . Iodinated Diagnostic Agents Rash and Other (See Comments)    Pt states that she is unable to have because she has chronic kidney disease.   Pt states that she is unable to have because she has chronic kidney disease. CKD CKD  . Oxycodone Other (See Comments)    Reaction:  Stomach pain   . Aleve [Naproxen]     Reaction: severe stomach pain  . Atorvastatin Nausea Only and Other (See Comments)    Reaction:  Weakness   . Codeine Diarrhea, Nausea Only and Other (See Comments)    Reaction:  Stomach pain Pt tolerates morphine   . Dilaudid [Hydromorphone Hcl]     Reactions: easy to overdose - blood pressure drops really low  . Doxycycline     Stomach pain   . Lipitor [Atorvastatin Calcium] Nausea Only    Reaction: nausea, weakness, pass blood  . Motrin [Ibuprofen]     Reaction: severe stomach pain  . Tape Other (See Comments)    Reaction:  Causes pts skin to tear  Pt states that she is able to use paper tape.      . Valdecoxib Swelling and Other (See Comments)    Pt states that her  hands and feet swell.    . Iodine Rash      Review of Systems negative except from HPI and PMH  Physical Exam BP 104/60 (BP Location: Right Arm, Patient Position: Sitting, Cuff Size: Large)   Pulse 70   Ht 5' 4.5" (1.638 m)   Wt 221 lb (100.2 kg)   BMI 37.35 kg/m  Well developed and nourished in no acute distress HENT normal Neck supple with JVP-flat Clear Regular rate and rhythm, no murmurs or gallops Abd-soft with active BS No Clubbing cyanosis edema Skin-warm and dry A & Oriented  Grossly normal sensory and motor function Sitting in a wheel chair  ECG atrial pacing at 70 Intervals 25/09/43  Assessment and  Plan  Sinus node dysfunction  Paroxysmal atrial fibrillation/flutter with a rapid ventricular response  Cardiomyopathy-interval normalization   Diabetes mellitus with nephropathy  Orthostatic intolerance and falls  HFpEF  Hypotension  Back pain chronic  RV enlargement  Thromboembolic risk profile as noted above   [employer]onic pacemaker upgraded to dual chamber .The patient's device was interrogated.  The information was reviewed. No changes were made in the programming.      Blood pressure remains low; her limited ambulation I think obviates some of the issues associated with her orthostasis.  Chronic pain is severely debilitated; I suspect the narcotics are contributing to her anorexia as well as her constipation  No interval atrial fibrillation  No obvious bleeding; we will check her TSH on amiodarone

## 2018-08-28 NOTE — Progress Notes (Signed)
Remote pacemaker transmission.   

## 2018-08-29 ENCOUNTER — Encounter: Payer: Self-pay | Admitting: Cardiology

## 2018-08-30 ENCOUNTER — Telehealth: Payer: Self-pay | Admitting: Internal Medicine

## 2018-08-30 ENCOUNTER — Ambulatory Visit: Payer: Medicare Other

## 2018-08-30 DIAGNOSIS — N289 Disorder of kidney and ureter, unspecified: Secondary | ICD-10-CM

## 2018-08-30 MED ORDER — LEVOTHYROXINE SODIUM 25 MCG PO TABS: 25.0000 ug | ORAL_TABLET | Freq: Every day | ORAL | 3 refills | Status: DC

## 2018-08-30 NOTE — Telephone Encounter (Signed)
I spoke with the patient. She is aware of her CMET/ TSH results from 08/28/18. She is also aware of Dr. Olin Pia recommendations to  1) START synthroid 25 mcg- take 1 tablet by mouth once daily 2) Check a UA now 3) Check a BMP in 2 weeks.  The patient is agreeable with the above. RX to be send to [facility] on [address]. She will go to the [facility] tomorrow for a UA and in 2 weeks for a BMP. She is also aware that [name] did send a message to Dr. Ouida Sills to try to help manage her thyroid. The patient verbalizes understanding of all of the above.

## 2018-08-30 NOTE — Telephone Encounter (Signed)
Notes recorded by Deboraha Sprang, MD on 08/30/2018 at 1:32 PM EST Lets start synthroid at 25 mcg and recheck bmet in 2 weeks Also plz get UA thzx

## 2018-08-31 ENCOUNTER — Other Ambulatory Visit
Admission: RE | Admit: 2018-08-31 | Discharge: 2018-08-31 | Disposition: A | Discharge status: Home / Self Care | Payer: Medicare Other | Source: Ambulatory Visit | Attending: Internal Medicine | Admitting: Internal Medicine

## 2018-08-31 DIAGNOSIS — N289 Disorder of kidney and ureter, unspecified: Secondary | ICD-10-CM | POA: Diagnosis present

## 2018-08-31 LAB — URINALYSIS, ROUTINE W REFLEX MICROSCOPIC
Bilirubin Urine: NEGATIVE
GLUCOSE, UA: NEGATIVE mg/dL
Hgb urine dipstick: NEGATIVE
Ketones, ur: NEGATIVE mg/dL
NITRITE: NEGATIVE
Protein, ur: NEGATIVE mg/dL
Specific Gravity, Urine: 1.008 (ref 1.005–1.030)
pH: 6 (ref 5.0–8.0)

## 2018-09-03 ENCOUNTER — Telehealth: Payer: Self-pay | Admitting: Internal Medicine

## 2018-09-03 NOTE — Telephone Encounter (Signed)
Spoke with pt who states she has had burning sensation and pain when urinating. I advised her Dr Caryl Comes has not addressed her UA as of yet, but I forwarded her results to her PCP for review. Pt states she will call her PCP for recommendation.

## 2018-09-03 NOTE — Telephone Encounter (Signed)
New Message    Patient wants to know results of Lab work ordered by [name].  Specifically urine lab work.

## 2018-09-04 NOTE — Telephone Encounter (Signed)
Notes recorded by Dollene Primrose, RN on 09/03/2018 at 4:27 PM EST Results sent to PCP and pt will be calling to address. ------  Notes recorded by Deboraha Sprang, MD on 09/03/2018 at 4:25 PM EST Please Inform Patient that -UA  is abnormal and will require further eval by her PCP   Thanks

## 2018-09-11 ENCOUNTER — Ambulatory Visit: Payer: Medicare Other

## 2018-09-14 ENCOUNTER — Telehealth: Payer: Self-pay | Admitting: Internal Medicine

## 2018-09-14 ENCOUNTER — Other Ambulatory Visit
Admission: RE | Admit: 2018-09-14 | Discharge: 2018-09-14 | Disposition: A | Discharge status: Home / Self Care | Payer: Medicare Other | Source: Ambulatory Visit | Attending: Internal Medicine | Admitting: Internal Medicine

## 2018-09-14 DIAGNOSIS — N289 Disorder of kidney and ureter, unspecified: Secondary | ICD-10-CM | POA: Insufficient documentation

## 2018-09-14 LAB — COMPREHENSIVE METABOLIC PANEL
ALT: 21 U/L (ref 0–44)
AST: 42 U/L — ABNORMAL HIGH (ref 15–41)
Albumin: 3.5 g/dL (ref 3.5–5.0)
Alkaline Phosphatase: 112 U/L (ref 38–126)
Anion gap: 10 (ref 5–15)
BUN: 32 mg/dL — AB (ref 8–23)
CO2: 24 mmol/L (ref 22–32)
Calcium: 8.8 mg/dL — ABNORMAL LOW (ref 8.9–10.3)
Chloride: 103 mmol/L (ref 98–111)
Creatinine, Ser: 1.94 mg/dL — ABNORMAL HIGH (ref 0.44–1.00)
GFR calc Af Amer: 28 mL/min — ABNORMAL LOW (ref >60)
GFR calc non Af Amer: 24 mL/min — ABNORMAL LOW (ref >60)
Glucose, Bld: 149 mg/dL — ABNORMAL HIGH (ref 70–99)
Potassium: 4.3 mmol/L (ref 3.5–5.1)
Sodium: 137 mmol/L (ref 135–145)
Total Bilirubin: 1.1 mg/dL (ref 0.3–1.2)
Total Protein: 6.6 g/dL (ref 6.5–8.1)

## 2018-09-14 NOTE — Telephone Encounter (Signed)
Fax received from [employer] patient assistance foundation stating:  "We have received the application regarding a request for assistance for Eliquis for your patient. We will review the application to determine whether your patient is eligible for the program, and respond to you within two business days with our decision."  Date of fax was 09/07/18- no further fax/ notification has been received at this time.

## 2018-09-18 ENCOUNTER — Ambulatory Visit: Payer: Medicare Other

## 2018-09-20 ENCOUNTER — Ambulatory Visit: Payer: Medicare Other

## 2018-09-25 ENCOUNTER — Ambulatory Visit: Payer: Medicare Other

## 2018-09-27 ENCOUNTER — Encounter: Payer: Medicare Other | Admitting: Physical Therapy

## 2018-09-27 ENCOUNTER — Ambulatory Visit: Payer: Medicare Other

## 2018-09-27 DIAGNOSIS — Z9989 Dependence on other enabling machines and devices: Secondary | ICD-10-CM | POA: Insufficient documentation

## 2018-09-27 IMAGING — DX DG ABD PORTABLE 1V
2 series · 2 of 2 positions shown · non-contrast
Comparison: 04/05/2018, 04/04/2018, CT 04/04/2018

CLINICAL DATA: 76-year-old female with a history of small bowel
obstruction

EXAM:
PORTABLE ABDOMEN - 1 VIEW

[abdomen kub (1 of 2)]
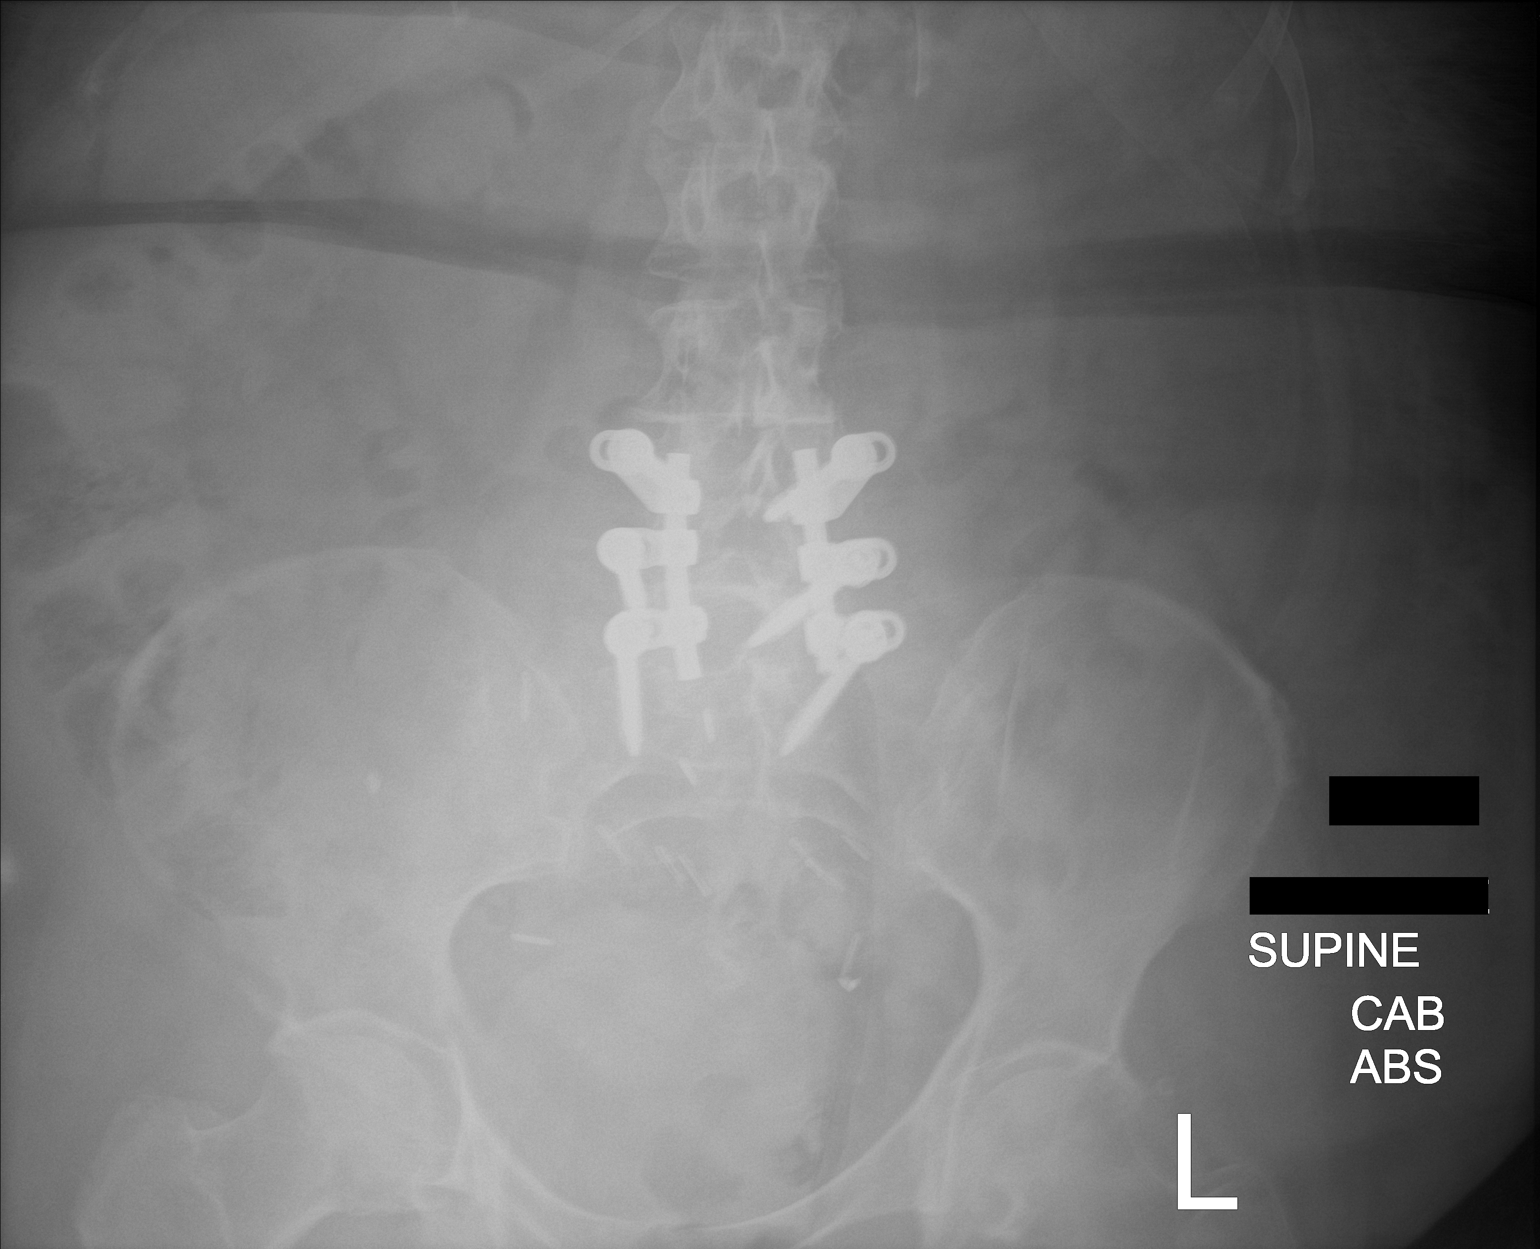

[abdomen kub (2 of 2)]
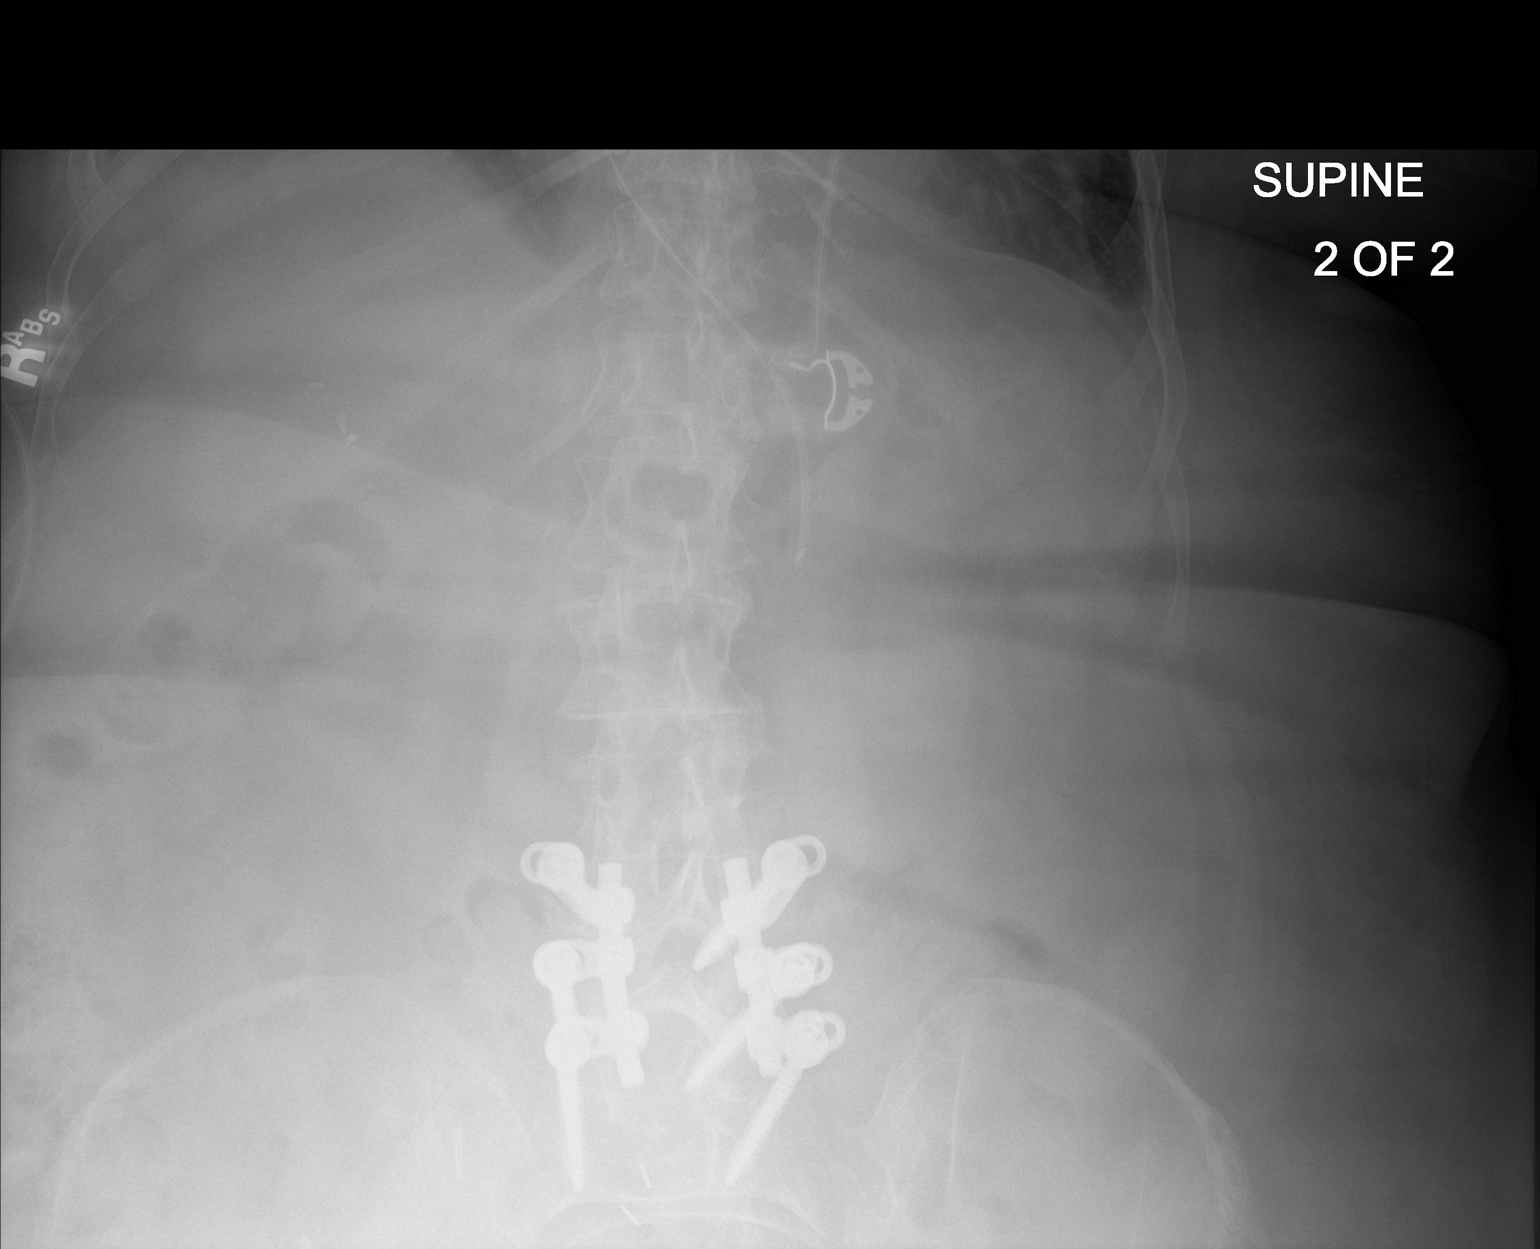

[2 of 2 positions shown; findings below may reference images not displayed]

FINDINGS: Gastric tube terminates within the left upper quadrant.

Cholecystectomy.

Surgical changes in the low abdomen/pelvis. Surgical changes of
prior lumbar fixation.

Gas and formed stool within the right colon.  No colonic distention.

No distended small bowel loops.
IMPRESSION: Nonobstructive bowel gas pattern.

Gastric tube terminates within the left upper quadrant.

## 2018-09-30 LAB — CUP PACEART REMOTE DEVICE CHECK
Battery Remaining Longevity: 140 mo
Battery Voltage: 3.02 V
Brady Statistic AP VP Percent: 0.03 %
Brady Statistic AP VS Percent: 91.67 %
Brady Statistic AS VP Percent: 0 %
Brady Statistic RA Percent Paced: 91.7 %
Brady Statistic RV Percent Paced: 0.04 %
Implantable Lead Implant Date: 20161019
Implantable Lead Implant Date: 20181217
Implantable Lead Location: 753860
Implantable Lead Model: 5076
Implantable Lead Model: 5076
Implantable Pulse Generator Implant Date: 20181217
Lead Channel Impedance Value: 342 Ohm
Lead Channel Impedance Value: 361 Ohm
Lead Channel Impedance Value: 418 Ohm
Lead Channel Impedance Value: 437 Ohm
Lead Channel Pacing Threshold Amplitude: 1 V
Lead Channel Pacing Threshold Pulse Width: 0.4 ms
Lead Channel Pacing Threshold Pulse Width: 0.4 ms
Lead Channel Sensing Intrinsic Amplitude: 5 mV
Lead Channel Sensing Intrinsic Amplitude: 6.375 mV
Lead Channel Sensing Intrinsic Amplitude: 6.375 mV
Lead Channel Setting Pacing Amplitude: 2.25 V
Lead Channel Setting Pacing Amplitude: 2.5 V
Lead Channel Setting Pacing Pulse Width: 0.4 ms
Lead Channel Setting Sensing Sensitivity: 1.2 mV
MDC IDC LEAD LOCATION: 753859
MDC IDC MSMT LEADCHNL RA SENSING INTR AMPL: 5 mV
MDC IDC MSMT LEADCHNL RV PACING THRESHOLD AMPLITUDE: 0.625 V
MDC IDC SESS DTM: 20191217043346
MDC IDC STAT BRADY AS VS PERCENT: 8.29 %

## 2018-09-30 IMAGING — DX DG CHEST 1V PORT
2 series · 2 of 2 positions shown · non-contrast
Comparison: 04/09/2018

CLINICAL DATA: Acute respiratory failure

EXAM:
PORTABLE CHEST 1 VIEW

[chest ap (1 of 2)]
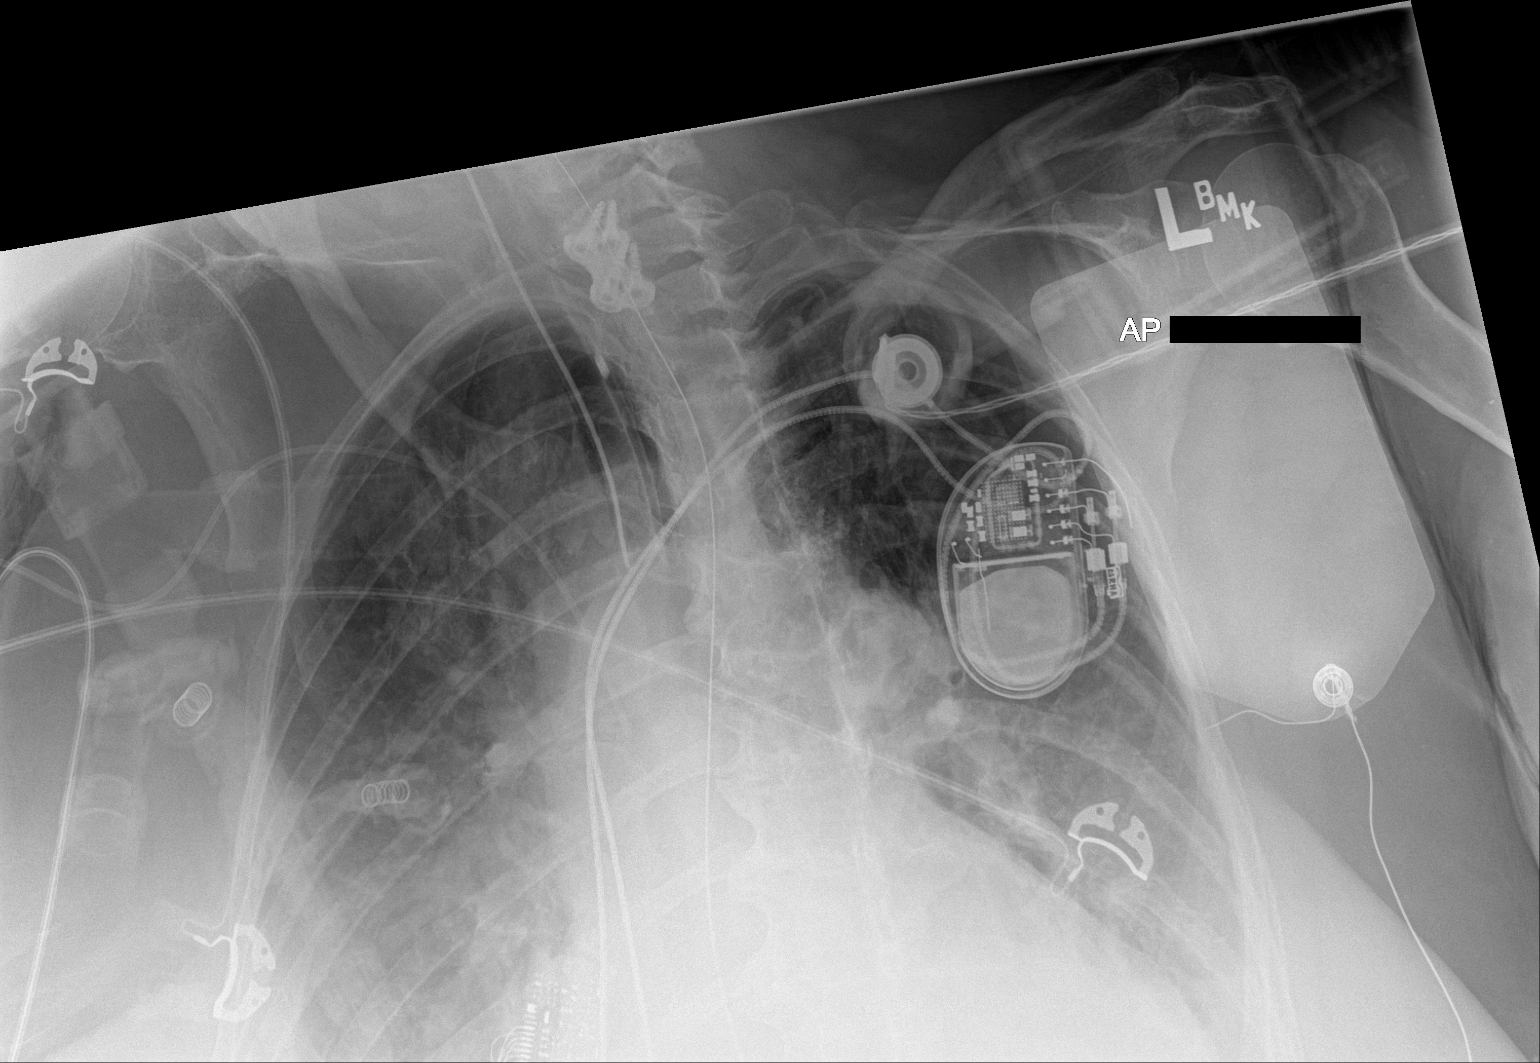

[chest ap (2 of 2)]
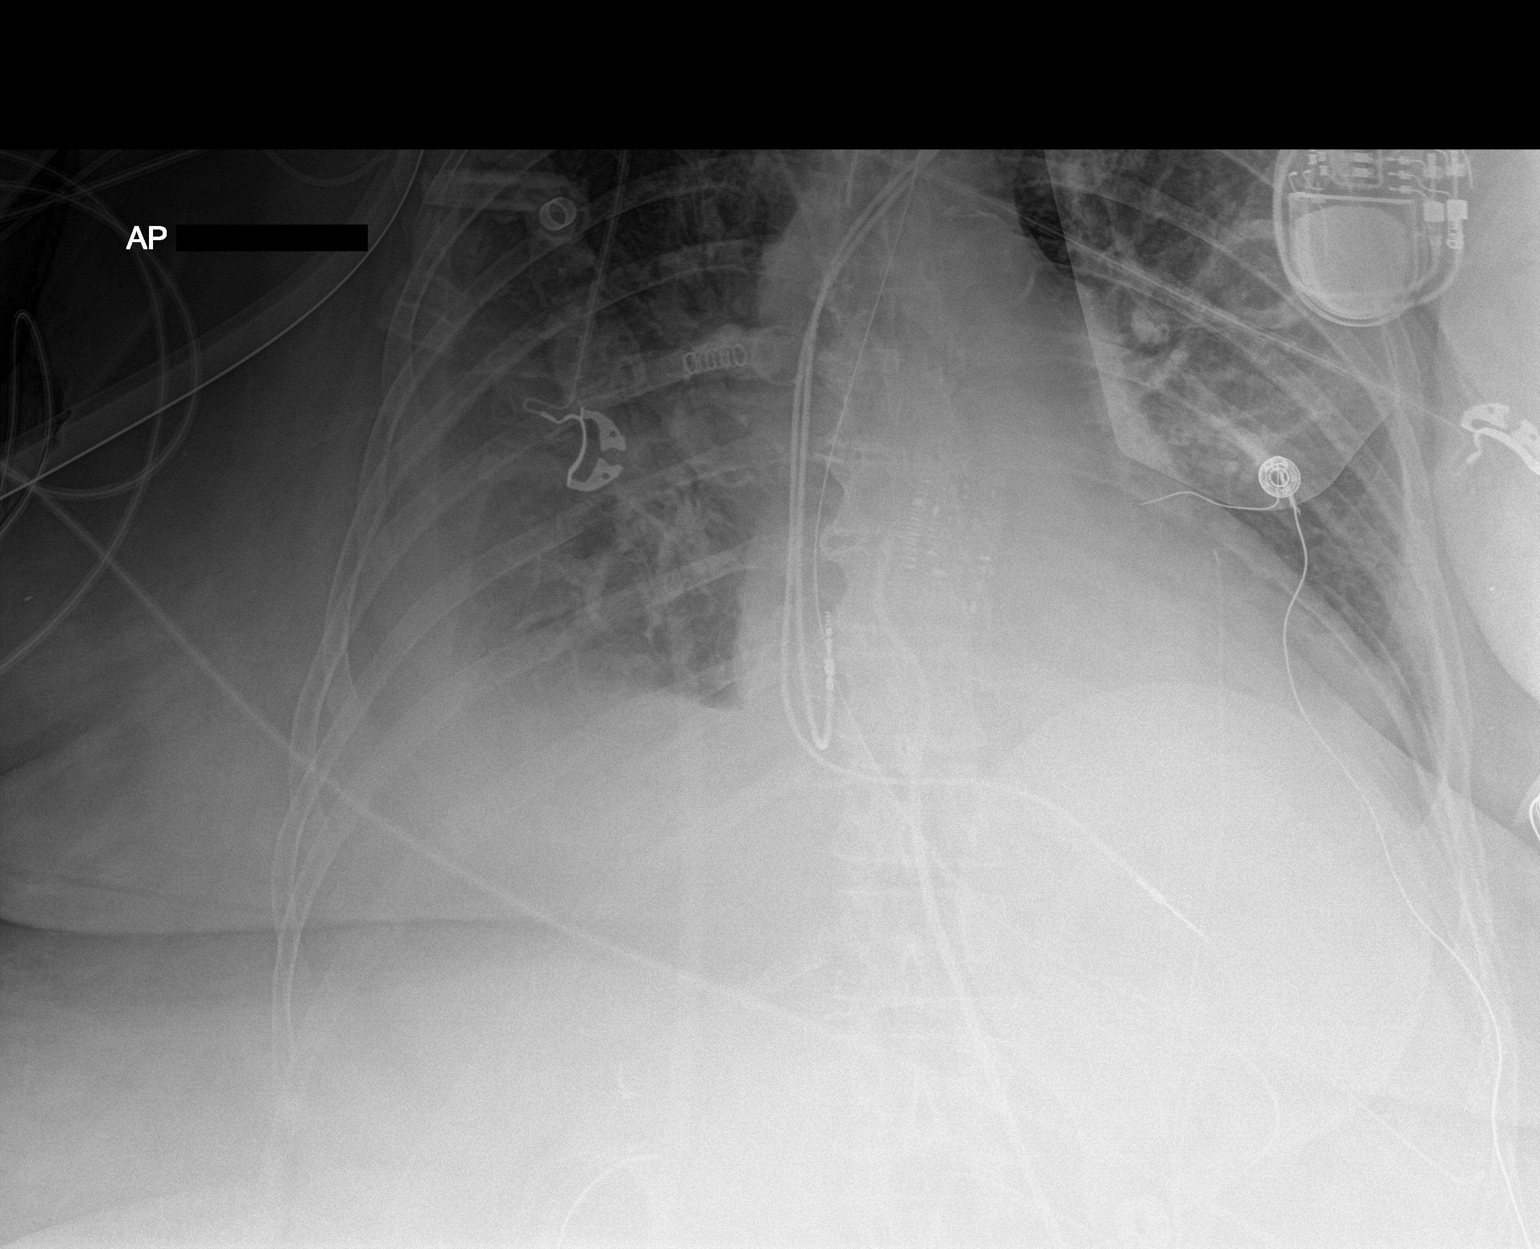

[2 of 2 positions shown; findings below may reference images not displayed]

FINDINGS: Cardiac shadow is mildly prominent. Pacing device is again seen.
Nasogastric catheter and endotracheal tube as well as a right-sided
PICC line are again noted and stable. Postsurgical changes in the
cervical spine are seen. Stable right-sided pleural effusion is
noted. Mild vascular congestion remains.
IMPRESSION: Overall stable appearance of the chest when compare with the
previous day.

## 2018-10-02 ENCOUNTER — Encounter: Payer: Self-pay | Admitting: Physical Therapy

## 2018-10-02 DIAGNOSIS — M545 Low back pain, unspecified: Secondary | ICD-10-CM

## 2018-10-02 DIAGNOSIS — R262 Difficulty in walking, not elsewhere classified: Secondary | ICD-10-CM

## 2018-10-02 DIAGNOSIS — M6281 Muscle weakness (generalized): Secondary | ICD-10-CM

## 2018-10-02 DIAGNOSIS — G8929 Other chronic pain: Secondary | ICD-10-CM

## 2018-10-02 IMAGING — DX DG CHEST 1V PORT
1 series · 1 of 1 positions shown · non-contrast
Comparison: 04/11/2018

CLINICAL DATA: Check endotracheal tube placement

EXAM:
PORTABLE CHEST 1 VIEW

[chest ap]
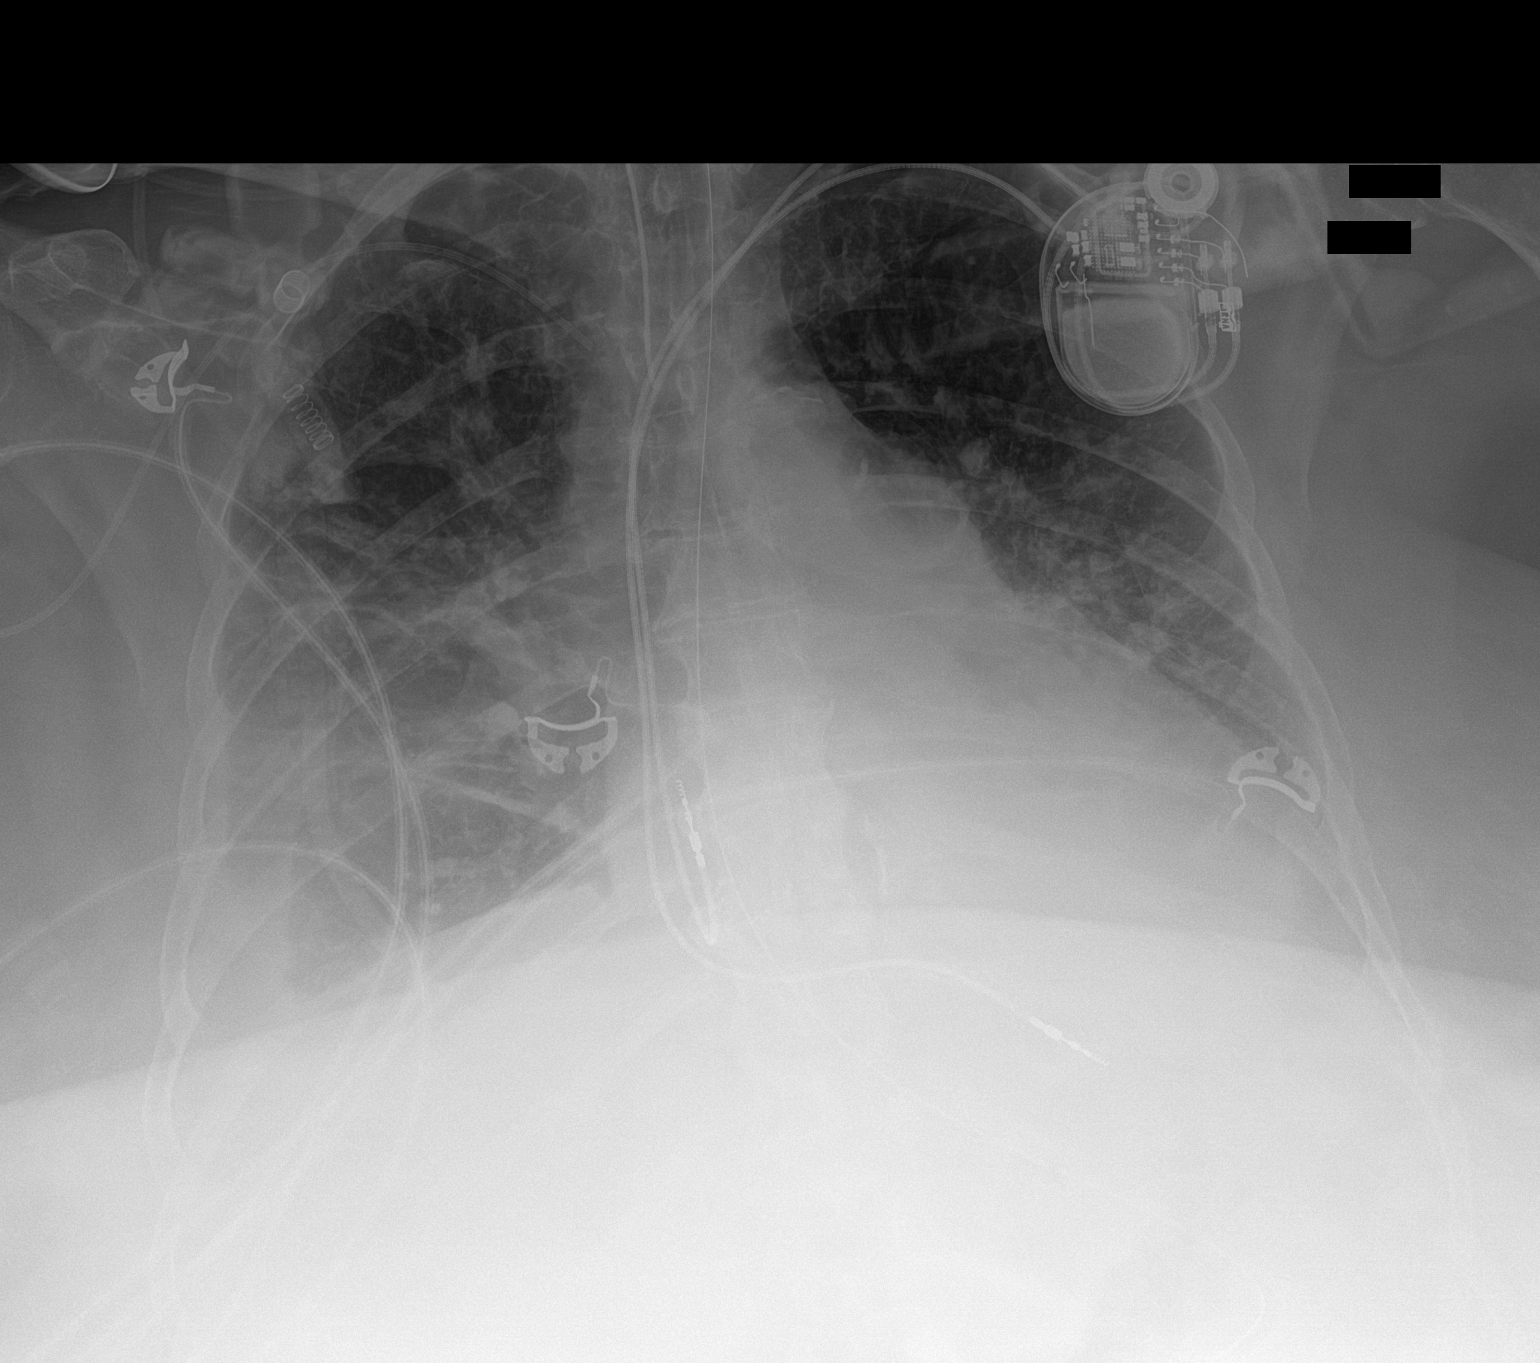

[1 of 1 positions shown; findings below may reference images not displayed]

FINDINGS: Endotracheal tube, nasogastric catheter and right-sided PICC line
are again seen and stable. Pacing device is again noted. Cardiac
shadow is stable. Bilateral pleural effusions are again identified.
Central vascular congestion is seen and stable. No new focal
abnormality is noted. No bony abnormality is seen.
IMPRESSION: Stable changes bilaterally.  No new focal abnormality is seen.

## 2018-10-02 NOTE — Therapy (Signed)
[facility] [address] [place], [address] Phone: [phone]   Fax:  [phone]  Physical Therapy Treatment Reporting Period: 08/03/2019 - 10/02/2018  Patient Details  Name: Deborah Obrien MRN: [id number] Date of Birth: Feb 04, 1941 Referring Provider (PT): Rennis Harding, MD   Encounter Date: 10/02/2018    Past Medical History:  Diagnosis Date  . A-fib ([place]) 2016  . Acid reflux   . Anxiety   . Bowel obstruction ([place])   . CHF (congestive heart failure) ([place]) 2017  . Chronic kidney disease (CKD) stage G3a/A1, moderately decreased glomerular filtration rate (GFR) between 45-59 mL/min/1.73 square meter and albuminuria creatinine ratio less than 30 mg/g (HCC)   . Diabetes mellitus without complication ([place])   . Dyspnea   . Fatty liver   . GI bleed   . Gout   . Hypertension   . Morbid obesity ([place])   . Osteoarthritis   . Osteoporosis   . Paroxysmal atrial fibrillation (HCC)   . Presence of permanent cardiac pacemaker   . Sinus node dysfunction ([place]) 08/24/2017  . TIA (transient ischemic attack)     Past Surgical History:  Procedure Laterality Date  . ABDOMINAL HYSTERECTOMY  1980  . APPENDECTOMY    . BACK SURGERY     2008  . BACK SURGERY  2019   patient describes kyphoplasty for compression fractures, MD referral said fusion  . BREAST BIOPSY Right 08/16   stereo fibroadenomatous change, neg for atypia  . BREAST EXCISIONAL BIOPSY Left    neg  . CARDIAC CATHETERIZATION Left 08/25/2016   Procedure: Left Heart Cath and Coronary Angiography;  Surgeon: Corey Skains, MD;  Location: [facility];  Service: Cardiovascular;  Laterality: Left;  . CARDIOVERSION N/A 06/28/2017   Procedure: CARDIOVERSION;  Surgeon: Corey Skains, MD;  Location: [facility];  Service: Cardiovascular;  Laterality: N/A;  . CARDIOVERSION N/A 02/06/2018   Procedure: CARDIOVERSION;  Surgeon: Josue Hector, MD;   Location: [facility];  Service: Cardiovascular;  Laterality: N/A;  . CATARACT EXTRACTION, BILATERAL  2012  . CHOLECYSTECTOMY    . colon blockage  1999  . COLON SURGERY  1999  . COLONOSCOPY  2016   polyps removed 2016  . DORSAL COMPARTMENT RELEASE Left 09/14/2016   Procedure: RELEASE DORSAL COMPARTMENT (DEQUERVAIN);  Surgeon: Dereck Leep, MD;  Location: [facility];  Service: Orthopedics;  Laterality: Left;  . ESOPHAGOGASTRODUODENOSCOPY N/A 01/27/2015   Procedure: ESOPHAGOGASTRODUODENOSCOPY (EGD);  Surgeon: Lollie Sails, MD;  Location: [facility];  Service: Endoscopy;  Laterality: N/A;  . ESOPHAGOGASTRODUODENOSCOPY (EGD) WITH PROPOFOL N/A 07/24/2015   Procedure: ESOPHAGOGASTRODUODENOSCOPY (EGD) WITH PROPOFOL;  Surgeon: Lucilla Lame, MD;  Location: [facility];  Service: Endoscopy;  Laterality: N/A;  . ESOPHAGOGASTRODUODENOSCOPY (EGD) WITH PROPOFOL N/A 04/12/2018   Procedure: ESOPHAGOGASTRODUODENOSCOPY (EGD) WITH PROPOFOL;  Surgeon: Jonathon Bellows, MD;  Location: [facility]COPY;  Service: Gastroenterology;  Laterality: N/A;  . ESOPHAGOGASTRODUODENOSCOPY (EGD) WITH PROPOFOL N/A 06/22/2018   Procedure: ESOPHAGOGASTRODUODENOSCOPY (EGD) WITH PROPOFOL;  Surgeon: [name], MD;  Location: [facility] END[facility]COPY;  Service: Endoscopy;  Laterality: N/A;  . ESOPHAGOGASTRODUODENOSCOPY (EGD) WITH PROPOFOL N/A 07/09/2018   Procedure: ESOPHAGOGASTRODUODENOSCOPY (EGD) WITH PROPOFOL with resection of gastric polyps;  Surgeon: Jonathon Bellows, MD;  Location: [facility] END[facility]COPY;  Service: Gastroenterology;  Laterality: N/A;  . EUS N/A 06/22/2018   Procedure: UPPER ENDOSCOPIC ULTRASOUND (EUS) RADIAL;  Surgeon: [name], MD;  Location: [facility] ENDOSCOPY;  Service: Endoscopy;  Laterality: N/A;  .  JOINT REPLACEMENT  2014   Bilateral Knee replacement  . LEAD REVISION/REPAIR N/A 08/29/2017   Procedure: LEAD REVISION/REPAIR;  Surgeon: Constance Haw, MD;  Location: [facility];  Service: Cardiovascular;   Laterality: N/A;  . OOPHORECTOMY    . PACEMAKER INSERTION Left 07/01/2015   Procedure: INSERTION PACEMAKER;  Surgeon: Isaias Cowman, MD;  Location: [facility];  Service: Cardiovascular;  Laterality: Left;  . PACEMAKER REVISION N/A 08/28/2017   Procedure: PACEMAKER REVISION;  Surgeon: Deboraha Sprang, MD;  Location: [facility];  Service: Cardiovascular;  Laterality: N/A;  . REPLACEMENT TOTAL KNEE BILATERAL    . SHOULDER ARTHROSCOPY WITH SUBACROMIAL DECOMPRESSION Left 2013  . TEE WITHOUT CARDIOVERSION N/A 06/28/2017   Procedure: TRANSESOPHAGEAL ECHOCARDIOGRAM (TEE);  Surgeon: Corey Skains, MD;  Location: [facility];  Service: Cardiovascular;  Laterality: N/A;    There were no vitals filed for this visit.  Subjective Assessment - 10/02/18 1534    Subjective  Patient not present at discharge. Is being discharged from physical therapy due to lack of tolerance for interventions.     Patient is accompained by:  Family member    Pertinent History  Patient is a 78 y.o. female who presents to outpatient physical therapy with a referral for low back pain with request for pool therapy. She is being evaluated again due to being hospitalized for abdominal surgery since her last PT evaluation. This patient's chief complaints consist of pain, inability to stand up straight, poor activity tolerance following recently being dropped at rehab facility leading to 2 compression fractures that required kyphoplasty, leading to the following functional deficits: difficulty with ADLs, IADLs, bed mobility, transfers, household and community mobility, social and community participation.  Relevant past medical history and comorbidities include chronic kidney disease (between stage III-IV according to pt), diabetes (last a1c 6.8), stomach polyp that was recently surgically repaired, obesity, Hx of B rotator cuff tears (with repairs), history of 2 strokes, pacemaker with revision, achy joints, bilateral knee  replacement, gout, dypsnea, acid reflux, CHF, GI bleed, hypertension, afib w/history of cardioversion, osteoporosis, repeated hospitalization with decreased endurance and functional mobility, wide range of allergies. Recent surgical procedures (in 2019): cardioversion, esophagogastroduendoscopy with propofol (x2), back surgery (pt describes kyphoplasty at 2 levels, and she mentioned prior fusion), recent eye surgery.  See chart for extensive past medical history.           OBJECTIVE:  Pt not present for measurement. Please see prior documentation for latest objective data.     PT Short Term Goals - 08/02/18 1948      PT SHORT TERM GOAL #1   Title  Be independent with home exercise program completed at least 3 times per week for self-management of symptoms.    Baseline  currently performing HEP from home health PT    Time  4    Period  Weeks    Status  New    Target Date  08/30/18        PT Long Term Goals - 08/02/18 1948      PT LONG TERM GOAL #1   Title  Be independent with a long-term home exercise program for self-management of symptoms.     Baseline  requires more instruction, has not received final HEP (08/02/2018);    Time  8    Period  Weeks    Status  On-going    Target Date  09/27/18      PT LONG TERM GOAL #2   Title  Patient will be able to ambulate > 200 feet mod I with LRAD for improved community mobility    Baseline  30 (08/02/2018);    Time  8    Period  Weeks    Status  On-going    Target Date  09/27/18      PT LONG TERM GOAL #3   Title  Reduce pain with functional activities to equal or less than 3/10 to allow patient to be more independent with usual activities including ADLs, IADLs, and social engagement with less difficulty.     Baseline  12/10 (08/02/2018);    Time  8    Period  Weeks    Status  On-going    Target Date  09/27/18      PT LONG TERM GOAL #4   Title  Be able to complete 5 sit to stand test in 30 seconds to improve functional  mobility for transfers and community participation.     Baseline  Difficulty with one repetition, requires use of hands (07/03/2018);    Time  6    Period  Weeks    Status  New      PT LONG TERM GOAL #5   Title  Patient will be able to ascend and decend 3 steps with BUE support and min A safely.     Baseline  Stairs not tested due to lack of activity tolerance (08/02/2018);    Time  8    Period  Weeks    Status  New    Target Date  09/27/18         Patient attended 2 physical therapy sessions and is now being discharged due to lack of tolerance for interventions including aquatic therapy. She did not make progress towards goals and was unable to continue after her last appointment 08/24/2019. She is now being discharged for lack of ability to participate.    Plan - 10/02/18 1538    Clinical Impression Statement  Patient attended 2 physical therapy sessions and is now being discharged due to lack of tolerance for interventions including aquatic therapy. She did not make progress towards goals and was unable to continue after her last appointment 08/24/2019. She is now being discharged for lack of ability to participate.     Rehab Potential  Fair    Clinical Impairments Affecting Rehab Potential  (+) patient motivation, spousal support; (-)  High fall risk, severity of symptoms, chronicity of functional impariment, extensiveness of comorbidites.    PT Frequency  2x / week    PT Duration  8 weeks    PT Treatment/Interventions  ADLs/Self Care Home Management;Aquatic Therapy;Moist Heat;Cryotherapy;Gait training;Stair training;Functional mobility training;Therapeutic activities;Therapeutic exercise;Balance training;Neuromuscular re-education;Patient/family education;Wheelchair mobility training;Manual techniques;Passive range of motion;Energy conservation;Joint Manipulations   joint mobilizations grades I-V   PT Next Visit Plan  Patient is now discharged from skilled physical therapy due to  lack of tolerance for interventions.     PT Home Exercise Plan  Continue HEP from HHPT    Consulted and Agree with Plan of Care  Patient    Family Member Consulted  husband       Patient will benefit from skilled therapeutic intervention in order to improve the following deficits and impairments:  Abnormal gait, Decreased balance, Decreased endurance, Decreased mobility, Difficulty walking, Hypomobility, Increased muscle spasms, Impaired sensation, Cardiopulmonary status limiting activity, Decreased range of motion, Impaired perceived functional ability, Improper body mechanics, Obesity, Decreased activity tolerance, Decreased coordination, Decreased strength, Decreased safety awareness, Impaired flexibility, Pain,  Postural dysfunction  Visit Diagnosis: Chronic midline low back pain without sciatica  Difficulty in walking, not elsewhere classified  Muscle weakness (generalized)  Acute low back pain without sciatica, unspecified back pain laterality     Problem List Patient Active Problem List   Diagnosis Date Noted  . RUQ pain   . UTI (urinary tract infection) 06/14/2018  . Type 2 diabetes mellitus with neurological complications ([place]) 00/37/0488  . Acute respiratory failure ([place]) 04/09/2018  . Chronic anticoagulation 04/09/2018  . Pacemaker 04/09/2018  . H/O TIA (transient ischemic attack) and stroke 04/09/2018  . CAD (coronary artery disease) 04/09/2018  . Hematemesis 04/05/2018  . Hypotension 02/04/2018  . Atrial fibrillation with RVR ([place]) 02/04/2018  . Atrial flutter ([place]) 08/24/2017  . Sinus node dysfunction ([place]) 08/24/2017  . Dizziness 06/12/2017  . Mastalgia 02/14/2017  . Acute on chronic renal insufficiency 01/09/2017  . Fatty liver 01/09/2017  . Benign essential HTN 09/08/2016  . Nephrolithiasis 02/22/2016  . Diabetic neuropathy with neurologic complication ([place]) 89/16/9450  . Moderate episode of recurrent major depressive disorder ([place]) 08/11/2015  . Gastritis  without bleeding   . Gastric polyp   . Gastro-esophageal reflux disease without esophagitis   . PAF (paroxysmal atrial fibrillation) ([place]) 06/29/2015  . Intestinal obstruction ([place]) 02/20/2015  . DDD (degenerative disc disease), lumbar 01/19/2015  . Back pain 01/12/2015  . Gout 01/12/2015  . Complete tear of left rotator cuff 10/16/2014  . Intervertebral disc disorder with radiculopathy of lumbar region 09/17/2014  . Lumbar radiculitis 09/17/2014  . Lumbar stenosis with neurogenic claudication 09/17/2014  . Neuritis or radiculitis due to rupture of lumbar intervertebral disc 09/17/2014  . Atherosclerosis of both carotid arteries 07/15/2014  . Essential hypertension 07/15/2014  . Hyperlipemia, mixed 07/15/2014  . 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 Body mass index (BMI) of 40.0-44.9 in adult ([place]) 06/21/2014  . Mixed incontinence 06/25/2013  . Mixed urge and stress incontinence 06/25/2013  . Nocturia 06/25/2013  . Blurring of visual image 04/11/2012    Nancy Nordmann, PT, DPT 10/02/2018, 3:39 PM  [facility] [address], [place], [address] Phone: [phone]   Fax:  [phone]  Name: Deborah Obrien MRN: [id number] Date of Birth: 1941/06/11

## 2018-10-05 ENCOUNTER — Telehealth: Payer: Self-pay | Admitting: Orthotics

## 2018-10-05 ENCOUNTER — Telehealth: Payer: Self-pay | Admitting: *Deleted

## 2018-10-05 NOTE — Telephone Encounter (Signed)
LVM on pts home phone ( ok per DPR) informing pt that her pacemaker is MRI compatible.

## 2018-10-05 NOTE — Telephone Encounter (Signed)
   [facility] Pre-operative Risk Assessment    Request for surgical clearance:  1. What type of surgery is being performed? MRI (LUMBAR) PT HAS DEVICE AND NEED TO IF OK TO HAVE MRI  2. When is this surgery scheduled? 10/12/18   3. What type of clearance is required (medical clearance vs. Pharmacy clearance to hold med vs. Both)? MEDICAL  4. Are there any medications that need to be held prior to surgery and how long?NONE TO BE HELD; NEED TO CONFIRM IF MRI OK TO SINCE PT HAS [employer] AZURE PACEMAKER   5. Practice name and name of physician performing surgery? [facility];    6. What is your office phone number [phone]    7.   What is your office fax number [phone]  8.   Anesthesia type (None, local, MAC, general) ? NONE LISTED   Julaine Hua 10/05/2018, 9:15 AM  _________________________________________________________________   (provider comments below)

## 2018-10-05 NOTE — Telephone Encounter (Signed)
Called patient to discuss status of DBS care.  She is cu rrently seeing Dr. Elvina Mattes and has NOT made an appointment with Dr. Prudence Davidson [facility]) for Diabetic Foot exam.   Told her that we would put all orders on hold until she sees a TFA doc.

## 2018-10-08 ENCOUNTER — Telehealth: Payer: Self-pay | Admitting: Internal Medicine

## 2018-10-08 NOTE — Telephone Encounter (Signed)
New message   Per Manus Gunning at [facility] the clearance form has not been received. Fax # is [phone].

## 2018-10-09 NOTE — Telephone Encounter (Signed)
   Clearance request faxed to Manus Gunning today.   Abigail Butts, PA-C 10/09/18

## 2018-10-09 NOTE — Telephone Encounter (Signed)
   Primary Cardiologist: [name], MD  Chart reviewed as part of pre-operative protocol coverage. Patient's pacemaker is MRI compatible.   I will route this recommendation to the requesting party via Epic fax function and remove from pre-op pool.  Please call with questions.  Abigail Butts, PA-C 10/09/2018, 9:47 AM

## 2018-10-11 ENCOUNTER — Telehealth: Payer: Self-pay | Admitting: Internal Medicine

## 2018-10-11 NOTE — Telephone Encounter (Signed)
I spoke with the patient and her husband regarding a fax I received from [employer].  This was stating that they were unable to complete processing her application for patient assistance for Eliquis as "Patient's insurance information is missing."  Per Mr. Giacomo, he did speak with someone from [employer] last week or so and gave additional insurance information/ ID #'s to them over the phone. He has not heard back from them. Per Mr. Weigel, he will call [employer] to follow up.  I asked that he call me back with further questions/ concerns.

## 2018-10-13 ENCOUNTER — Other Ambulatory Visit: Payer: Self-pay | Admitting: Internal Medicine

## 2018-10-15 ENCOUNTER — Other Ambulatory Visit: Payer: Self-pay

## 2018-10-15 NOTE — Telephone Encounter (Signed)
Pt requesting refill for Eliquis 5 mg BID.

## 2018-10-23 NOTE — Telephone Encounter (Signed)
Patient came by and signed consent form. Form faxed to [facility] at [phone].

## 2018-10-23 NOTE — Telephone Encounter (Signed)
Spoke with Deborah Obrien at [facility]. States they did not receive the patient's consent page with signature or date and that is what they need.  Called patient and husband. Husband will come by this afternoon to pick up the paper for his wife to sign.

## 2018-10-23 NOTE — Telephone Encounter (Signed)
Please call regarding missing pages to fax for application for Eliquis.

## 2018-10-30 LAB — CUP PACEART INCLINIC DEVICE CHECK
Battery Remaining Longevity: 139 mo
Brady Statistic AP VP Percent: 0.03 %
Brady Statistic AP VS Percent: 92.13 %
Brady Statistic AS VP Percent: 0 %
Brady Statistic RA Percent Paced: 92.13 %
Brady Statistic RV Percent Paced: 0.04 %
Implantable Lead Implant Date: 20181217
Implantable Lead Location: 753859
Implantable Lead Location: 753860
Implantable Lead Model: 5076
Implantable Lead Model: 5076
Lead Channel Impedance Value: 323 Ohm
Lead Channel Impedance Value: 361 Ohm
Lead Channel Impedance Value: 418 Ohm
Lead Channel Pacing Threshold Amplitude: 0.75 V
Lead Channel Pacing Threshold Amplitude: 1 V
Lead Channel Pacing Threshold Pulse Width: 0.4 ms
Lead Channel Pacing Threshold Pulse Width: 0.4 ms
Lead Channel Sensing Intrinsic Amplitude: 6.5 mV
Lead Channel Setting Pacing Amplitude: 2.25 V
Lead Channel Setting Pacing Amplitude: 2.5 V
Lead Channel Setting Pacing Pulse Width: 0.4 ms
Lead Channel Setting Sensing Sensitivity: 1.2 mV
MDC IDC LEAD IMPLANT DT: 20161019
MDC IDC MSMT BATTERY VOLTAGE: 3.02 V
MDC IDC MSMT LEADCHNL RV IMPEDANCE VALUE: 418 Ohm
MDC IDC PG IMPLANT DT: 20181217
MDC IDC SESS DTM: 20191217145806
MDC IDC STAT BRADY AS VS PERCENT: 7.84 %

## 2018-10-31 ENCOUNTER — Other Ambulatory Visit: Payer: Self-pay

## 2018-10-31 ENCOUNTER — Encounter: Payer: Self-pay | Admitting: Gastroenterology

## 2018-10-31 ENCOUNTER — Ambulatory Visit (INDEPENDENT_AMBULATORY_CARE_PROVIDER_SITE_OTHER): Payer: Medicare Other | Admitting: Gastroenterology

## 2018-10-31 VITALS — BP 107/67 | HR 82 | Ht 64.0 in | Wt 215.0 lb

## 2018-10-31 DIAGNOSIS — R1013 Epigastric pain: Secondary | ICD-10-CM | POA: Diagnosis not present

## 2018-10-31 DIAGNOSIS — K317 Polyp of stomach and duodenum: Secondary | ICD-10-CM

## 2018-10-31 NOTE — Patient Instructions (Signed)
Gastroparesis  Gastroparesis is a condition in which food takes longer than normal to empty from the stomach. The condition is usually long-lasting (chronic). It may also be called delayed gastric emptying. There is no cure, but there are treatments and things that you can do at home to help relieve symptoms. Treating the underlying condition that causes gastroparesis can also help relieve symptoms. What are the causes? In many cases, the cause of this condition is not known. Possible causes include:  A hormone (endocrine) disorder, such as hypothyroidism or diabetes.  A nervous system disease, such as Parkinson's disease or multiple sclerosis.  Cancer, infection, or surgery that affects the stomach or vagus nerve. The vagus nerve runs from your chest, through your neck, to the lower part of your brain.  A connective tissue disorder, such as scleroderma.  Certain medicines. What increases the risk? You are more likely to develop this condition if you:  Have certain disorders or diseases, including: ? An endocrine disorder. ? An eating disorder. ? Amyloidosis. ? Scleroderma. ? Parkinson's disease. ? Multiple sclerosis. ? Cancer or infection of the stomach or the vagus nerve.  Have had surgery on the stomach or vagus nerve.  Take certain medicines.  Are female. What are the signs or symptoms? Symptoms of this condition include:  Feeling full after eating very little.  Nausea.  Vomiting.  Heartburn.  Abdominal bloating.  Inconsistent blood sugar (glucose) levels on blood tests.  Lack of appetite.  Weight loss.  Acid from the stomach coming up into the esophagus (gastroesophageal reflux).  Sudden tightening (spasm) of the stomach, which can be painful. Symptoms may come and go. Some people may not notice any symptoms. How is this diagnosed? This condition is diagnosed with tests, such as:  Tests that check how long it takes food to move through the stomach and  intestines. These tests include: ? Upper gastrointestinal (GI) series. For this test, you drink a liquid that shows up well on X-rays, and then X-rays will be taken of your intestines. ? Gastric emptying scintigraphy. For this test, you eat food that contains a small amount of radioactive material, and then scans are taken. ? Wireless capsule GI monitoring system. For this test, you swallow a pill (capsule) that records information about how foods and fluid move through your stomach.  Gastric manometry. For this test, a tube is passed down your throat and into your stomach to measure electrical and muscular activity.  Endoscopy. For this test, a long, thin tube is passed down your throat and into your stomach to check for problems in your stomach lining.  Ultrasound. This test uses sound waves to create images of inside the body. This can help rule out gallbladder disease or pancreatitis as a cause of your symptoms. How is this treated? There is no cure for gastroparesis. Treatment may include:  Treating the underlying cause.  Managing your symptoms by making changes to your diet and exercise habits.  Taking medicines to control nausea and vomiting and to stimulate stomach muscles.  Getting food through a feeding tube in the hospital. This may be done in severe cases.  Having surgery to insert a device into your body that helps improve stomach emptying and control nausea and vomiting (gastric neurostimulator). Follow these instructions at home:  Take over-the-counter and prescription medicines only as told by your health care provider.  Follow instructions from your health care provider about eating or drinking restrictions. Your health care provider may recommend that you: ? Eat   about eating or drinking restrictions. Your health care provider may recommend that you:  Eat smaller meals more often.  Eat low-fat foods.  Eat low-fiber forms of high-fiber foods. For example, eat cooked vegetables instead of raw vegetables.  Have only liquid foods instead of solid foods. Liquid foods are easier to digest.  Drink enough fluid to keep your urine pale  yellow.  Exercise as often as told by your health care provider.  Keep all follow-up visits as told by your health care provider. This is important.  Contact a health care provider if you:  Notice that your symptoms do not improve with treatment.  Have new symptoms.  Get help right away if you:  Have severe abdominal pain that does not improve with treatment.  Have nausea that is severe or does not go away.  Cannot drink fluids without vomiting.  Summary  Gastroparesis is a chronic condition in which food takes longer than normal to empty from the stomach.  Symptoms include nausea, vomiting, heartburn, abdominal bloating, and loss of appetite.  Eating smaller portions, and low-fat, low-fiber foods may help you manage your symptoms.  Get help right away if you have severe abdominal pain.  This information is not intended to replace advice given to you by your health care provider. Make sure you discuss any questions you have with your health care provider.  Document Released: 08/29/2005 Document Revised: 07/04/2017 Document Reviewed: 07/04/2017  Elsevier Interactive Patient Education  2019 [employer]

## 2018-10-31 NOTE — Progress Notes (Signed)
Jonathon Bellows MD, MRCP(U.K) [address]  [place]  [place], [place] [address]  Main: [phone]  Fax: [phone]   Primary Care Physician: [name], MD  Primary Gastroenterologist:  Dr. Jonathon Bellows   Chief Complaint  Patient presents with  . Follow-up    Abdominal pain, gastric polyp    HPI: Deborah Obrien is a 78 y.o. female   Summary of history :  Deborah Safranek Burgessis a 78 y.o.y/o femaleadmitted a few months back in the hospital with a severe upper GI bleed. I performed an upper endoscopy and noted a large gastric polyp that was bleeding. At that point of time I sprayed hemosprayand obtained hemostasis. She is on Eliquis for atrial fibrillation and she has a history of strokes. She is status post cholecystectomy.LFT's normal except elevated alk phos. She underwent an USG RUQ which showed a mildly dilated CBD 9 mm, she underwent an EUS at [facility] after and found no stones in her CBD and was discharged. 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 Features of prior kyphoplasty at t12- L1  EGD 07/09/18 : EGD: 4 large gastric polyps were resected. All were hyperplastic , few other smaller polyps seen but not resected.    Interval history 08/06/18 -10/31/2018   CBC Latest Ref Rng & Units 08/06/2018 06/20/2018 06/18/2018  WBC 3.4 - 10.8 x10E3/uL 5.0 3.5(L) 3.5(L)  Hemoglobin 11.1 - 15.9 g/dL 12.2 10.3(L) 9.9(L)  Hematocrit 34.0 - 46.6 % 37.6 32.0(L) 29.9(L)  Platelets 150 - 450 x10E3/uL 209 216 192    Today she is here for follow up . Since last visit had her back surgery , having some pain LUQ Having issues with her back, more surgery planned. Pain medication dosage has been increased. Feels full with a few bites, bloating , feels a ttimes food from previous meal still in her stomach.     Current  Outpatient Medications  Medication Sig Dispense Refill  . acetaminophen (TYLENOL) 325 MG tablet Take 2 tablets (650 mg total) by mouth every 6 (six) hours as needed for mild pain (or Fever >/= 101).    Marland Kitchen allopurinol (ZYLOPRIM) 100 MG tablet Take 150 mg by mouth daily.     Marland Kitchen amiodarone (PACERONE) 200 MG tablet TAKE 1 TABLET BY MOUTH TWICE DAILY 180 tablet 2  . benzocaine-Menthol (DERMOPLAST) 20-0.5 % AERO Apply 1 application topically 4 (four) times daily as needed for irritation.    . bisacodyl (DULCOLAX) 10 MG suppository Place 1 suppository (10 mg total) rectally daily as needed for moderate constipation. 12 suppository 0  . busPIRone (BUSPAR) 10 MG tablet Take 10 mg by mouth 2 (two) times daily.     . carboxymethylcellulose (REFRESH PLUS) 0.5 % SOLN 1 drop 2 (two) times daily as needed.    . colchicine 0.6 MG tablet Take 0.6 mg by mouth daily as needed (gout).     . dibucaine (NUPERCAINAL) 1 % OINT Place 1 application rectally as needed for hemorrhoids.  0  . ELIQUIS 5 MG TABS tablet TAKE 1 TABLET BY MOUTH TWICE DAILY 60 tablet 3  . furosemide (LASIX) 40 MG tablet Take 1 tablet (40 mg total) by mouth 2 (two) times daily. 30 tablet   . HYDROcodone-acetaminophen (NORCO/VICODIN) 5-325 MG tablet Take 1 tablet by mouth every 6 (six) hours as needed for moderate pain. 120 tablet 0  .  hydrocortisone 2.5 % cream Apply 1 application topically daily as needed (rash).    . insulin NPH-regular Human (NOVOLIN 70/30) (70-30) 100 UNIT/ML injection Inject 22 Units into the skin 2 (two) times daily.     Marland Kitchen ipratropium-albuterol (DUONEB) 0.5-2.5 (3) MG/3ML SOLN Take 3 mLs by nebulization 2 (two) times daily. 360 mL   . levothyroxine (SYNTHROID, LEVOTHROID) 25 MCG tablet Take 1 tablet (25 mcg total) by mouth daily before breakfast. 30 tablet 3  . neomycin-bacitracin-polymyxin (NEOSPORIN) ointment Apply topically as needed for wound care. 15 g 0  . NON FORMULARY Diet Type: Mechanical Soft Chopped    . oxybutynin  (DITROPAN) 5 MG tablet Take 10 mg by mouth 2 (two) times daily.    . OXYGEN Inhale 2 L into the lungs continuous.    . pantoprazole (PROTONIX) 40 MG tablet Take 1 tablet (40 mg total) by mouth 2 (two) times daily. 60 tablet 0  . PARoxetine (PAXIL) 40 MG tablet Take 40 mg by mouth every morning.    Vladimir Faster Glycol-Propyl Glycol (SYSTANE) 0.4-0.3 % SOLN Place 1 drop into both eyes 2 (two) times daily as needed.    . pravastatin (PRAVACHOL) 20 MG tablet Take 20 mg by mouth every evening.  2  . senna-docusate (SENOKOT-S) 8.6-50 MG tablet Take 1 tablet by mouth every morning.    . sucralfate (CARAFATE) 1 g tablet TAKE 1 TABLET BY MOUTH 4 TIMES DAILY. DISSOLVE IN WATER AND DRINK AS SLURRY  1  . talc (ZEASORB) powder Apply 1 application topically as needed (under the breast irritation).    . traZODone (DESYREL) 50 MG tablet Take 50-100 mg by mouth at bedtime.     . ferrous sulfate 325 (65 FE) MG EC tablet Take 1 tablet (325 mg total) by mouth 2 (two) times daily. 60 tablet 3   No current facility-administered medications for this visit.     Allergies as of 10/31/2018 - Review Complete 10/31/2018  Allergen Reaction Noted  . Cephalosporins Other (See Comments) 01/19/2015  . Fish allergy Shortness Of Breath 11/26/2015  . Macrolides and ketolides Other (See Comments) 01/12/2015  . Meperidine Shortness Of Breath, Nausea Only, and Other (See Comments) 01/19/2015  . Other Nausea Only, Rash, and Other (See Comments) 01/12/2015  . Prednisone Anaphylaxis and Other (See Comments) 01/19/2015  . Shellfish allergy Anaphylaxis, Shortness Of Breath, Diarrhea, Nausea Only, and Rash 06/29/2015  . Sulfa antibiotics Shortness Of Breath, Diarrhea, Nausea Only, and Other (See Comments) 06/25/2013  . Sulfacetamide sodium Diarrhea, Nausea Only, Other (See Comments), and Shortness Of Breath 08/12/2015  . Telbivudine  08/12/2015  . Uloric [febuxostat] Anaphylaxis 08/22/2016  . Aspirin Other (See Comments) 01/19/2015    . Celecoxib Other (See Comments) 01/19/2015  . Cephalexin Diarrhea, Nausea Only, and Other (See Comments) 01/19/2015  . Erythromycin Diarrhea, Nausea Only, and Other (See Comments) 01/19/2015  . Hydromorphone Other (See Comments) 01/19/2015  . Iodinated diagnostic agents Rash and Other (See Comments) 01/19/2015  . Oxycodone Other (See Comments) 01/19/2015  . Aleve [naproxen]  07/03/2018  . Atorvastatin Nausea Only and Other (See Comments) 01/19/2015  . Codeine Diarrhea, Nausea Only, and Other (See Comments) 01/19/2015  . Dilaudid [hydromorphone hcl]  07/03/2018  . Doxycycline  08/24/2017  . Lipitor [atorvastatin calcium] Nausea Only 07/03/2018  . Motrin [ibuprofen]  07/03/2018  . Tape Other (See Comments) 06/29/2015  . Valdecoxib Swelling and Other (See Comments) 01/19/2015  . Iodine Rash 06/29/2015    ROS:  General: Negative for anorexia, weight loss, fever, chills,  fatigue, weakness. ENT: Negative for hoarseness, difficulty swallowing , nasal congestion. CV: Negative for chest pain, angina, palpitations, dyspnea on exertion, peripheral edema.  Respiratory: Negative for dyspnea at rest, dyspnea on exertion, cough, sputum, wheezing.  GI: See history of present illness. GU:  Negative for dysuria, hematuria, urinary incontinence, urinary frequency, nocturnal urination.  Endo: Negative for unusual weight change.    Physical Examination:   BP 107/67   Pulse 82   Ht 5' 4"  (1.626 m)   Wt 215 lb (97.5 kg)   BMI 36.90 kg/m   General: in wheel chair comfortable .  Eyes: No icterus. Conjunctivae pink. Mouth: Oropharyngeal mucosa moist and pink , no lesions erythema or exudate. Lungs: Clear to auscultation bilaterally. Non-labored. Heart: Regular rate and rhythm, no murmurs rubs or gallops.  Abdomen: Bowel sounds are normal, nontender, nondistended, no hepatosplenomegaly or masses, no abdominal bruits or hernia , no rebound or guarding.   Extremities: No lower extremity edema. No  clubbing or deformities. Neuro: Alert and oriented x 3.  Grossly intact. Skin: Warm and dry, no jaundice.   Psych: Alert and cooperative, normal mood and affect.   Imaging Studies: No results found.  Assessment and Plan:   Deborah Obrien is a 78 y.o. y/o female  here to follow up for gastric polyps which previously caused severe gi bleed requiring resection , on Eloquis. S/p EGD and resection of multiple gastric polyps. 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   Plan  1. EGD in 6-8 months to check for any other large residual polyps once her back issues are sorted and she can lay flat for the procedure. The last time had severe pain after the procedures from her back 2. Check CBC today  3. Gastroparesis diet    Dr Jonathon Bellows  MD,MRCP [facility]) Follow up in 3 months

## 2018-11-01 ENCOUNTER — Encounter: Payer: Self-pay | Admitting: Gastroenterology

## 2018-11-01 LAB — CBC WITH DIFFERENTIAL/PLATELET
Basophils Absolute: 0 10*3/uL (ref 0.0–0.2)
Basos: 0 %
EOS (ABSOLUTE): 0 10*3/uL (ref 0.0–0.4)
Eos: 0 %
HEMOGLOBIN: 11.5 g/dL (ref 11.1–15.9)
Hematocrit: 36.1 % (ref 34.0–46.6)
Immature Grans (Abs): 0 10*3/uL (ref 0.0–0.1)
Immature Granulocytes: 0 %
Lymphocytes Absolute: 0.9 10*3/uL (ref 0.7–3.1)
Lymphs: 14 %
MCH: 30.1 pg (ref 26.6–33.0)
MCHC: 31.9 g/dL (ref 31.5–35.7)
MCV: 95 fL (ref 79–97)
Monocytes Absolute: 0.5 10*3/uL (ref 0.1–0.9)
Monocytes: 8 %
Neutrophils Absolute: 5.4 10*3/uL (ref 1.4–7.0)
Neutrophils: 78 %
Platelets: 247 10*3/uL (ref 150–450)
RBC: 3.82 x10E6/uL (ref 3.77–5.28)
RDW: 12.9 % (ref 11.7–15.4)
WBC: 6.9 10*3/uL (ref 3.4–10.8)

## 2018-11-05 ENCOUNTER — Telehealth: Payer: Self-pay

## 2018-11-05 NOTE — Telephone Encounter (Signed)
   [facility] Pre-operative Risk Assessment    Request for surgical clearance:  1. What type of surgery is being performed? LL3 TFES1   2. When is this surgery scheduled? 11/19/18   3. What type of clearance is required (medical clearance vs. Pharmacy clearance to hold med vs. Both)? Pharmacy  4. Are there any medications that need to be held prior to surgery and how long? Eliquis 3 days prior   5. Practice name and name of physician performing surgery? [facility]   6. What is your office phone number [phone]    7.   What is your office fax number [phone]  8.   Anesthesia type (None, local, MAC, general) ? None listed   Mady Haagensen 11/05/2018, 9:17 AM  _________________________________________________________________   (provider comments below)

## 2018-11-05 NOTE — Telephone Encounter (Signed)
Agree with recommendatoin

## 2018-11-05 NOTE — Telephone Encounter (Signed)
Patient with diagnosis of Afib on Eliquis for anticoagulation.    Procedure: LL3 TFES1 Date of procedure: 11/19/18  CHADS2-VASc score of  9 (CHF, HTN, AGE, DM2, stroke/tia x 2, CAD, AGE, female)  CrCl 11ml/min  With spinal procedure, we recommend holding DOAC at least 3 days. She does have CKD and has a history of TIA. Will route to [name] for input on hold of Eliquis.

## 2018-11-06 NOTE — Telephone Encounter (Signed)
   Primary Cardiologist: [name], MD  Chart reviewed as part of pre-operative protocol coverage. Patient was contacted 11/06/2018 in reference to pre-operative risk assessment for pending surgery as outlined below.  Deborah Obrien was last seen on 08/28/2018 by [name].  Since that day, Deborah Obrien has done well with no new cardiac complaints.  She has a permanent pacemaker for sinus node dysfunction. Last interrogation on 10/30/18 showed <0.1% Aflutter. She is managed on amiodarone and Eliquis.   Therefore, based on ACC/AHA guidelines, the patient would be at acceptable risk for the planned procedure without further cardiovascular testing.   According to our Pharmacy protocol: Patient with diagnosis of Afib on Eliquis for anticoagulation.    Procedure: LL3 TFES1 Date of procedure: 11/19/18  CHADS2-VASc score of  9 (CHF, HTN, AGE, DM2, stroke/tia x 2, CAD, AGE, female) CrCl 29ml/min  With spinal procedure, we recommend holding DOAC at least 3 days. She does have CKD and has a history of TIA.  Caryl Comes is in agreement to hold for 3 days.  I will route this recommendation to the requesting party via Epic fax function and remove from pre-op pool.  Please call with questions.  Daune Perch, NP 11/06/2018, 9:51 AM

## 2018-11-06 NOTE — Telephone Encounter (Signed)
Ok to hold Eliquis for 3 days per Dr Caryl Comes.

## 2018-11-16 ENCOUNTER — Telehealth: Payer: Self-pay | Admitting: Internal Medicine

## 2018-11-16 NOTE — Telephone Encounter (Signed)
Fax received from [employer] stating the patient has been approved for Eliquis patient assistance.  Approved from 11/06/2018- 09/12/2019.  I called and spoke with the patient to notify her of the above- she was already aware, but appreciative for the call back.   Approval letter placed in patient assistance file.

## 2018-11-26 ENCOUNTER — Telehealth: Payer: Self-pay | Admitting: Internal Medicine

## 2018-11-26 NOTE — Telephone Encounter (Signed)
Called patient. She's been taking amiodarone 100 mg (1/2 tablet) two times a day.  Last AVS from 08/2018, says amiodarone 200 mg two times a day. It is difficult to figure out the trail to find the correct dose.  Routing to Dr Caryl Comes for clarification.

## 2018-11-26 NOTE — Telephone Encounter (Signed)
Patient has question about medication,   amiodarone (PACERONE) 200 MG tablet   She has question about the dose, how much she should be taking and when.

## 2018-11-27 ENCOUNTER — Ambulatory Visit (INDEPENDENT_AMBULATORY_CARE_PROVIDER_SITE_OTHER): Payer: Medicare Other | Admitting: *Deleted

## 2018-11-27 ENCOUNTER — Other Ambulatory Visit: Payer: Self-pay

## 2018-11-27 DIAGNOSIS — I4891 Unspecified atrial fibrillation: Secondary | ICD-10-CM

## 2018-11-27 DIAGNOSIS — I442 Atrioventricular block, complete: Secondary | ICD-10-CM

## 2018-11-27 MED ORDER — AMIODARONE HCL 200 MG PO TABS: 200.0000 mg | ORAL_TABLET | Freq: Every day | ORAL | Status: DC

## 2018-11-27 NOTE — Telephone Encounter (Signed)
Noted- RX for amiodarone changed on the patient's medication list to 200 mg once daily.

## 2018-11-27 NOTE — Telephone Encounter (Signed)
Called and spoke with patient Currently taking amio 200 mg day ( 100 bid)  Review of recent transmission shows sign Afib prior to 9/19 so will continue her on 200 mg daily ( taking it at once)  Thanks

## 2018-11-28 LAB — CUP PACEART REMOTE DEVICE CHECK
Battery Voltage: 3.01 V
Brady Statistic AP VP Percent: 0.05 %
Brady Statistic AS VS Percent: 1.32 %
Brady Statistic RA Percent Paced: 98.68 %
Brady Statistic RV Percent Paced: 0.05 %
Implantable Lead Implant Date: 20161019
Implantable Lead Implant Date: 20181217
Implantable Lead Location: 753860
Implantable Lead Model: 5076
Implantable Lead Model: 5076
Lead Channel Impedance Value: 304 Ohm
Lead Channel Impedance Value: 342 Ohm
Lead Channel Impedance Value: 380 Ohm
Lead Channel Impedance Value: 399 Ohm
Lead Channel Pacing Threshold Amplitude: 0.625 V
Lead Channel Pacing Threshold Amplitude: 0.75 V
Lead Channel Pacing Threshold Pulse Width: 0.4 ms
Lead Channel Pacing Threshold Pulse Width: 0.4 ms
Lead Channel Sensing Intrinsic Amplitude: 5 mV
Lead Channel Sensing Intrinsic Amplitude: 5 mV
Lead Channel Sensing Intrinsic Amplitude: 5.5 mV
Lead Channel Sensing Intrinsic Amplitude: 5.5 mV
Lead Channel Setting Pacing Amplitude: 2 V
Lead Channel Setting Pacing Amplitude: 2.5 V
Lead Channel Setting Pacing Pulse Width: 0.4 ms
Lead Channel Setting Sensing Sensitivity: 1.2 mV
MDC IDC LEAD LOCATION: 753859
MDC IDC MSMT BATTERY REMAINING LONGEVITY: 142 mo
MDC IDC PG IMPLANT DT: 20181217
MDC IDC SESS DTM: 20200317104328
MDC IDC STAT BRADY AP VS PERCENT: 98.63 %
MDC IDC STAT BRADY AS VP PERCENT: 0 %

## 2018-12-05 NOTE — Progress Notes (Signed)
Remote pacemaker transmission.   

## 2018-12-09 ENCOUNTER — Other Ambulatory Visit: Payer: Self-pay | Admitting: Internal Medicine

## 2018-12-10 ENCOUNTER — Telehealth: Payer: Self-pay | Admitting: Internal Medicine

## 2018-12-10 NOTE — Telephone Encounter (Signed)
Spoke w/ pt and informed her that there is no notices that her device is disconnected. Instructed her to call tech support b/c they can see things that we aren't able to see and they will be able to inform her if her monitor is truly working or not.

## 2018-12-10 NOTE — Telephone Encounter (Signed)
  Patient does not think that her monitor that she puts over her pacemaker is working. Please call

## 2018-12-13 ENCOUNTER — Telehealth: Payer: Self-pay | Admitting: Gastroenterology

## 2018-12-13 NOTE — Telephone Encounter (Signed)
Pt husband left vm he would like for  Dr. Vicente Males to refill rx Pantropazole 40 mg LEG  That was originally prescribed by [facility] clinic but he needs this filled send to [place]

## 2018-12-14 ENCOUNTER — Other Ambulatory Visit: Payer: Self-pay

## 2018-12-14 MED ORDER — PANTOPRAZOLE SODIUM 40 MG PO TBEC: 40.0000 mg | DELAYED_RELEASE_TABLET | Freq: Two times a day (BID) | ORAL | 0 refills | Status: DC

## 2018-12-14 NOTE — Telephone Encounter (Signed)
Spoke with pt and informed her that we have sent the pantoprazole refill to her preferred pharmacy.

## 2018-12-14 NOTE — Telephone Encounter (Signed)
Yes can refill 

## 2019-01-28 ENCOUNTER — Ambulatory Visit: Payer: Medicare Other | Admitting: Gastroenterology

## 2019-02-25 ENCOUNTER — Encounter: Payer: Self-pay | Admitting: Gastroenterology

## 2019-02-25 ENCOUNTER — Ambulatory Visit (INDEPENDENT_AMBULATORY_CARE_PROVIDER_SITE_OTHER): Payer: Medicare Other | Admitting: Gastroenterology

## 2019-02-25 ENCOUNTER — Ambulatory Visit: Payer: Medicare Other | Admitting: Gastroenterology

## 2019-02-25 ENCOUNTER — Other Ambulatory Visit: Payer: Self-pay

## 2019-02-25 DIAGNOSIS — K317 Polyp of stomach and duodenum: Secondary | ICD-10-CM | POA: Diagnosis not present

## 2019-02-25 NOTE — Progress Notes (Signed)
Jonathon Bellows , MD [address]  [place]  [place], [place] [address]  Main: [phone]  Fax: [phone]   Primary Care Physician: [name], MD  Virtual Visit via Telephone Note  I connected with patient on 02/25/19 at  9:15 AM EDT by telephone and verified that I am speaking with the correct person using two identifiers.   I discussed the limitations, risks, security and privacy concerns of performing an evaluation and management service by telephone and the availability of in person appointments. I also discussed with the patient that there may be a patient responsible charge related to this service. The patient expressed understanding and agreed to proceed.  Location of Patient: Home Location of Provider: Home Persons involved: Patient and provider only   History of Present Illness: F/u gastric polyp and abdominal pain   HPI: JENISHA FAISON is a 78 y.o. female   Summary of history :  Koleen Celia Burgessis a 78 y.o.y/o femaleadmitted in mid 2019  with a severe upper GI bleed. I performed an upper endoscopy and noted a large gastric polyp that was bleeding. At that point of time I sprayed hemosprayand obtained hemostasis. She was on Eliquis for atrial fibrillation and she has had a history of strokes. She is status post cholecystectomy.LFT's normal except elevated alk phos. She underwent an USG RUQ which showed a mildly dilated CBD 9 mm, she underwent an EUS at [facility] after and found no stones in her CBD and was discharged. 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 Features of prior kyphoplasty at t12- L1  EGD 07/09/18 : EGD: 4 large gastric polyps were resected. All were hyperplastic , few other smaller polyps seen but not resected.   Interval history11/25/19 -10/31/2018   Has had a gout- then had  shingles - still hurting . Rash has gone away except for one place. Was placed on gabapentin . Stomach doing "pretty good", no pain. Stool has been light brown. Continues to have back pain    Current Outpatient Medications  Medication Sig Dispense Refill  . acetaminophen (TYLENOL) 325 MG tablet Take 2 tablets (650 mg total) by mouth every 6 (six) hours as needed for mild pain (or Fever >/= 101).    Marland Kitchen allopurinol (ZYLOPRIM) 100 MG tablet Take 150 mg by mouth daily.     Marland Kitchen amiodarone (PACERONE) 200 MG tablet Take 1 tablet (200 mg total) by mouth daily.    . benzocaine-Menthol (DERMOPLAST) 20-0.5 % AERO Apply 1 application topically 4 (four) times daily as needed for irritation.    . bisacodyl (DULCOLAX) 10 MG suppository Place 1 suppository (10 mg total) rectally daily as needed for moderate constipation. 12 suppository 0  . busPIRone (BUSPAR) 10 MG tablet Take 10 mg by mouth 2 (two) times daily.     . carboxymethylcellulose (REFRESH PLUS) 0.5 % SOLN 1 drop 2 (two) times daily as needed.    . colchicine 0.6 MG tablet Take 0.6 mg by mouth daily as needed (gout).     . dibucaine (NUPERCAINAL) 1 % OINT Place 1 application rectally as needed for hemorrhoids.  0  . ELIQUIS 5 MG TABS tablet TAKE 1 TABLET BY MOUTH TWICE DAILY 60 tablet 3  . ferrous sulfate 325 (65 FE) MG EC tablet Take 1 tablet (325 mg total) by mouth 2 (two) times daily. 60 tablet  3  . furosemide (LASIX) 40 MG tablet Take 1 tablet (40 mg total) by mouth 2 (two) times daily. 30 tablet   . HYDROcodone-acetaminophen (NORCO/VICODIN) 5-325 MG tablet Take 1 tablet by mouth every 6 (six) hours as needed for moderate pain. 120 tablet 0  . hydrocortisone 2.5 % cream Apply 1 application topically daily as needed (rash).    . insulin NPH-regular Human (NOVOLIN 70/30) (70-30) 100 UNIT/ML injection Inject 22 Units into the skin 2 (two) times daily.     Marland Kitchen ipratropium-albuterol (DUONEB) 0.5-2.5 (3) MG/3ML SOLN Take 3 mLs by nebulization 2 (two) times  daily. 360 mL   . levothyroxine (SYNTHROID, LEVOTHROID) 25 MCG tablet TAKE 1 TABLET BY MOUTH ONCE DAILY BEFORE BREAKFAST 90 tablet 1  . neomycin-bacitracin-polymyxin (NEOSPORIN) ointment Apply topically as needed for wound care. 15 g 0  . NON FORMULARY Diet Type: Mechanical Soft Chopped    . oxybutynin (DITROPAN) 5 MG tablet Take 10 mg by mouth 2 (two) times daily.    . OXYGEN Inhale 2 L into the lungs continuous.    . pantoprazole (PROTONIX) 40 MG tablet Take 1 tablet (40 mg total) by mouth 2 (two) times daily. 180 tablet 0  . PARoxetine (PAXIL) 40 MG tablet Take 40 mg by mouth every morning.    Vladimir Faster Glycol-Propyl Glycol (SYSTANE) 0.4-0.3 % SOLN Place 1 drop into both eyes 2 (two) times daily as needed.    . pravastatin (PRAVACHOL) 20 MG tablet Take 20 mg by mouth every evening.  2  . senna-docusate (SENOKOT-S) 8.6-50 MG tablet Take 1 tablet by mouth every morning.    . sucralfate (CARAFATE) 1 g tablet TAKE 1 TABLET BY MOUTH 4 TIMES DAILY. DISSOLVE IN WATER AND DRINK AS SLURRY  1  . talc (ZEASORB) powder Apply 1 application topically as needed (under the breast irritation).    . traZODone (DESYREL) 50 MG tablet Take 50-100 mg by mouth at bedtime.      No current facility-administered medications for this visit.     Allergies as of 02/25/2019 - Review Complete 10/31/2018  Allergen Reaction Noted  . Cephalosporins Other (See Comments) 01/19/2015  . Fish allergy Shortness Of Breath 11/26/2015  . Macrolides and ketolides Other (See Comments) 01/12/2015  . Meperidine Shortness Of Breath, Nausea Only, and Other (See Comments) 01/19/2015  . Other Nausea Only, Rash, and Other (See Comments) 01/12/2015  . Prednisone Anaphylaxis and Other (See Comments) 01/19/2015  . Shellfish allergy Anaphylaxis, Shortness Of Breath, Diarrhea, Nausea Only, and Rash 06/29/2015  . Sulfa antibiotics Shortness Of Breath, Diarrhea, Nausea Only, and Other (See Comments) 06/25/2013  . Sulfacetamide sodium  Diarrhea, Nausea Only, Other (See Comments), and Shortness Of Breath 08/12/2015  . Telbivudine  08/12/2015  . Uloric [febuxostat] Anaphylaxis 08/22/2016  . Aspirin Other (See Comments) 01/19/2015  . Celecoxib Other (See Comments) 01/19/2015  . Cephalexin Diarrhea, Nausea Only, and Other (See Comments) 01/19/2015  . Erythromycin Diarrhea, Nausea Only, and Other (See Comments) 01/19/2015  . Hydromorphone Other (See Comments) 01/19/2015  . Iodinated diagnostic agents Rash and Other (See Comments) 01/19/2015  . Oxycodone Other (See Comments) 01/19/2015  . Aleve [naproxen]  07/03/2018  . Atorvastatin Nausea Only and Other (See Comments) 01/19/2015  . Codeine Diarrhea, Nausea Only, and Other (See Comments) 01/19/2015  . Dilaudid [hydromorphone hcl]  07/03/2018  . Doxycycline  08/24/2017  . Lipitor [atorvastatin calcium] Nausea Only 07/03/2018  . Motrin [ibuprofen]  07/03/2018  . Tape Other (See Comments) 06/29/2015  . Valdecoxib Swelling and Other (  See Comments) 01/19/2015  . Iodine Rash 06/29/2015    Review of Systems:    All systems reviewed and negative except where noted in HPI.   Observations/Objective:  Labs: CMP     Component Value Date/Time   NA 137 09/14/2018 1245   NA 141 10/16/2014 1135   K 4.3 09/14/2018 1245   K 4.6 10/16/2014 1135   CL 103 09/14/2018 1245   CL 108 (H) 10/16/2014 1135   CO2 24 09/14/2018 1245   CO2 27 10/16/2014 1135   GLUCOSE 149 (H) 09/14/2018 1245   GLUCOSE 97 10/16/2014 1135   BUN 32 (H) 09/14/2018 1245   BUN 25 (H) 10/16/2014 1135   CREATININE 1.94 (H) 09/14/2018 1245   CREATININE 1.35 (H) 10/16/2014 1135   CALCIUM 8.8 (L) 09/14/2018 1245   CALCIUM 8.5 10/16/2014 1135   PROT 6.6 09/14/2018 1245   PROT 8.0 10/29/2012 1100   ALBUMIN 3.5 09/14/2018 1245   ALBUMIN 3.9 10/29/2012 1100   AST 42 (H) 09/14/2018 1245   AST 47 (H) 10/29/2012 1100   ALT 21 09/14/2018 1245   ALT 27 10/29/2012 1100   ALKPHOS 112 09/14/2018 1245   ALKPHOS 156 (H)  10/29/2012 1100   BILITOT 1.1 09/14/2018 1245   BILITOT 1.6 (H) 10/29/2012 1100   GFRNONAA 24 (L) 09/14/2018 1245   GFRNONAA 41 (L) 10/16/2014 1135   GFRNONAA 35 (L) 10/29/2012 1100   GFRAA 28 (L) 09/14/2018 1245   GFRAA 49 (L) 10/16/2014 1135   GFRAA 41 (L) 10/29/2012 1100   Lab Results  Component Value Date   WBC 6.9 10/31/2018   HGB 11.5 10/31/2018   HCT 36.1 10/31/2018   MCV 95 10/31/2018   PLT 247 10/31/2018    Imaging Studies: No results found.  Assessment and Plan:   AZALEE WEIMER is a 78 y.o. y/o female here to follow upfor gastric polypswhich previously caused severe gi bleed requiring resection , on Eloquis. S/p EGD and resection of multiple gastric polyps.    Plan  1. EGD inaugust 2020  to check for any other large residual polyps .The last time had severe pain after the procedures from her back. She is recovering at this time from the shingles.   I have discussed alternative options, risks & benefits,  which include, but are not limited to, bleeding, infection, perforation,respiratory complication & drug reaction.  The patient agrees with this plan & written consent will be obtained.        I discussed the assessment and treatment plan with the patient. The patient was provided an opportunity to ask questions and all were answered. The patient agreed with the plan and demonstrated an understanding of the instructions.   The patient was advised to call back or seek an in-person evaluation if the symptoms worsen or if the condition fails to improve as anticipated.  I provided 13 minutes of non-face-to-face time during this encounter.  Dr Jonathon Bellows MD,MRCP [facility]) Gastroenterology/Hepatology Pager: [phone]   Speech recognition software was used to dictate this note.

## 2019-02-26 ENCOUNTER — Ambulatory Visit (INDEPENDENT_AMBULATORY_CARE_PROVIDER_SITE_OTHER): Payer: Medicare Other | Admitting: *Deleted

## 2019-02-26 ENCOUNTER — Other Ambulatory Visit: Payer: Self-pay

## 2019-02-26 ENCOUNTER — Telehealth: Payer: Self-pay

## 2019-02-26 DIAGNOSIS — K317 Polyp of stomach and duodenum: Secondary | ICD-10-CM

## 2019-02-26 DIAGNOSIS — I495 Sick sinus syndrome: Secondary | ICD-10-CM

## 2019-02-26 LAB — CUP PACEART REMOTE DEVICE CHECK
Battery Remaining Longevity: 137 mo
Battery Voltage: 3 V
Brady Statistic AP VP Percent: 0.06 %
Brady Statistic AP VS Percent: 99.93 %
Brady Statistic AS VP Percent: 0 %
Brady Statistic AS VS Percent: 0.01 %
Brady Statistic RA Percent Paced: 99.99 %
Brady Statistic RV Percent Paced: 0.06 %
Date Time Interrogation Session: 20200616052128
Implantable Lead Implant Date: 20161019
Implantable Lead Implant Date: 20181217
Implantable Lead Location: 753859
Implantable Lead Location: 753860
Implantable Lead Model: 5076
Implantable Lead Model: 5076
Implantable Pulse Generator Implant Date: 20181217
Lead Channel Impedance Value: 323 Ohm
Lead Channel Impedance Value: 361 Ohm
Lead Channel Impedance Value: 361 Ohm
Lead Channel Impedance Value: 418 Ohm
Lead Channel Pacing Threshold Amplitude: 0.5 V
Lead Channel Pacing Threshold Amplitude: 0.75 V
Lead Channel Pacing Threshold Pulse Width: 0.4 ms
Lead Channel Pacing Threshold Pulse Width: 0.4 ms
Lead Channel Sensing Intrinsic Amplitude: 4.875 mV
Lead Channel Sensing Intrinsic Amplitude: 4.875 mV
Lead Channel Sensing Intrinsic Amplitude: 5 mV
Lead Channel Sensing Intrinsic Amplitude: 5 mV
Lead Channel Setting Pacing Amplitude: 2 V
Lead Channel Setting Pacing Amplitude: 2.5 V
Lead Channel Setting Pacing Pulse Width: 0.4 ms
Lead Channel Setting Sensing Sensitivity: 1.2 mV

## 2019-02-26 NOTE — Telephone Encounter (Addendum)
   [facility] Pre-operative Risk Assessment    Request for surgical clearance:  1. What type of surgery is being performed? Upper Endoscopy   2. When is this surgery scheduled? 04/12/19   3. What type of clearance is required (medical clearance vs. Pharmacy clearance to hold med vs. Both)? Both  4. Are there any medications that need to be held prior to surgery and how long? Eliquis 5 mg (at least 48 hours)  5. Practice name and name of physician performing surgery? [facility], Dr. Jonathon Bellows   6. What is your office phone number [phone]    7.   What is your office fax number [phone]  8.   Anesthesia type (None, local, MAC, general) ? General   Deborah Obrien 02/26/2019, 3:18 PM  _________________________________________________________________   (provider comments below)

## 2019-02-27 NOTE — Telephone Encounter (Signed)
I will route this recommendation to the requesting party via Epic fax function and remove from pre-op pool.  Please call with questions.  [place], [place] 02/27/2019, 11:24 AM

## 2019-02-27 NOTE — Telephone Encounter (Signed)
Patient with diagnosis of afib on Eliquis for anticoagulation.    Procedure: Upper Endoscopy Date of procedure: 04/12/2019  CHADS2-VASc score of  9 (CHF, HTN, AGE, DM2, stroke/tia x 2, CAD, AGE, female)  CrCl 27 ml/min  Per office protocol, patient can hold Eliquis for 2 days prior to procedure.

## 2019-02-27 NOTE — Telephone Encounter (Signed)
   Primary Cardiologist: [name], MD  Chart reviewed as part of pre-operative protocol coverage. Patient was contacted 02/27/2019 in reference to pre-operative risk assessment for pending surgery as outlined below.  Deborah Obrien was last seen on 08/28/18 by [name].  Since that day, Deborah Obrien has done well.  Therefore, based on ACC/AHA guidelines, the patient would be at acceptable risk for the planned procedure without further cardiovascular testing.   Pharmacy to review anticoagulation.   [place], [place] 02/27/2019, 9:19 AM

## 2019-03-04 ENCOUNTER — Telehealth: Payer: Self-pay | Admitting: Gastroenterology

## 2019-03-04 NOTE — Telephone Encounter (Signed)
Spoke with pt and informed her of blood thinner holding instructions for the Eliquis. Pt is aware that she'll need to stop her Eliquis 2 days prior to the procedure.

## 2019-03-04 NOTE — Telephone Encounter (Signed)
Patient called & l/m on v/m stating she had received letter to let her colonoscopy was scheduled for 04-12-19 & covid testing was on 04-09-19 but not instructions on when she should stop her blood thinner

## 2019-03-08 ENCOUNTER — Encounter: Payer: Self-pay | Admitting: Cardiology

## 2019-03-08 NOTE — Progress Notes (Signed)
Remote pacemaker transmission.   

## 2019-03-28 ENCOUNTER — Telehealth: Payer: Self-pay | Admitting: Gastroenterology

## 2019-03-28 NOTE — Telephone Encounter (Signed)
Patient's husband Deborah Obrien called stating they may have a problem with her procedure scheduled on 04-12-19. Please call.

## 2019-03-29 NOTE — Telephone Encounter (Signed)
Pt is calling she states she has a broken rib and needs to reschedule her procedure please call

## 2019-03-31 ENCOUNTER — Other Ambulatory Visit: Payer: Self-pay | Admitting: Gastroenterology

## 2019-04-01 ENCOUNTER — Other Ambulatory Visit (HOSPITAL_COMMUNITY): Payer: Self-pay | Admitting: Internal Medicine

## 2019-04-01 ENCOUNTER — Other Ambulatory Visit: Payer: Self-pay | Admitting: Internal Medicine

## 2019-04-01 DIAGNOSIS — R079 Chest pain, unspecified: Secondary | ICD-10-CM

## 2019-04-01 NOTE — Telephone Encounter (Signed)
Spoke with pt regarding her request to reschedule her upper endoscopy procedure. Pt states she has a broken rib and wants to postpone her procedure until she has recovered. Pt requests to move the procedure to early September. I have contacted [facility], pt procedure has been rescheduled to 05-17-19.

## 2019-04-01 NOTE — Telephone Encounter (Signed)
Deborah Obrien called in & states he had spoke to DR Anna's nurse about his wife scheduled for a colonoacopy on 04-12-19 & covid test on 04-09-19. However she has a broken rib & is not able to lay for any period of time. Please advise.

## 2019-04-01 NOTE — Telephone Encounter (Signed)
Ok thank you 

## 2019-04-02 ENCOUNTER — Other Ambulatory Visit: Payer: Self-pay

## 2019-04-02 ENCOUNTER — Ambulatory Visit
Admission: RE | Admit: 2019-04-02 | Discharge: 2019-04-02 | Disposition: A | Discharge status: Home / Self Care | Payer: Medicare Other | Source: Ambulatory Visit | Attending: Internal Medicine | Admitting: Internal Medicine

## 2019-04-02 DIAGNOSIS — R079 Chest pain, unspecified: Secondary | ICD-10-CM

## 2019-04-19 ENCOUNTER — Other Ambulatory Visit: Payer: Self-pay | Admitting: Orthopaedic Surgery

## 2019-04-19 DIAGNOSIS — R5381 Other malaise: Secondary | ICD-10-CM

## 2019-04-23 ENCOUNTER — Other Ambulatory Visit: Payer: Self-pay | Admitting: Orthopaedic Surgery

## 2019-04-23 DIAGNOSIS — S32010D Wedge compression fracture of first lumbar vertebra, subsequent encounter for fracture with routine healing: Secondary | ICD-10-CM

## 2019-04-23 DIAGNOSIS — S22080G Wedge compression fracture of T11-T12 vertebra, subsequent encounter for fracture with delayed healing: Secondary | ICD-10-CM

## 2019-05-14 ENCOUNTER — Other Ambulatory Visit
Admission: RE | Admit: 2019-05-14 | Discharge: 2019-05-14 | Disposition: A | Discharge status: Home / Self Care | Payer: Medicare Other | Source: Ambulatory Visit | Attending: Gastroenterology | Admitting: Gastroenterology

## 2019-05-14 ENCOUNTER — Other Ambulatory Visit: Payer: Self-pay

## 2019-05-14 DIAGNOSIS — Z20828 Contact with and (suspected) exposure to other viral communicable diseases: Secondary | ICD-10-CM | POA: Diagnosis not present

## 2019-05-14 DIAGNOSIS — K317 Polyp of stomach and duodenum: Secondary | ICD-10-CM | POA: Diagnosis not present

## 2019-05-14 DIAGNOSIS — Z01812 Encounter for preprocedural laboratory examination: Secondary | ICD-10-CM | POA: Insufficient documentation

## 2019-05-14 LAB — SARS CORONAVIRUS 2 (TAT 6-24 HRS): SARS Coronavirus 2: NEGATIVE

## 2019-05-16 ENCOUNTER — Encounter: Payer: Self-pay | Admitting: *Deleted

## 2019-05-17 ENCOUNTER — Encounter: Payer: Self-pay | Admitting: *Deleted

## 2019-05-17 ENCOUNTER — Ambulatory Visit
Admission: RE | Admit: 2019-05-17 | Discharge: 2019-05-17 | Disposition: A | Discharge status: Home / Self Care | Payer: Medicare Other | Attending: Gastroenterology | Admitting: Gastroenterology

## 2019-05-17 ENCOUNTER — Ambulatory Visit: Payer: Medicare Other | Admitting: Certified Registered"

## 2019-05-17 ENCOUNTER — Other Ambulatory Visit: Payer: Self-pay

## 2019-05-17 ENCOUNTER — Encounter
Admission: RE | Disposition: A | Discharge status: Home / Self Care | Payer: Self-pay | Source: Home / Self Care | Attending: Gastroenterology

## 2019-05-17 DIAGNOSIS — Z79890 Hormone replacement therapy (postmenopausal): Secondary | ICD-10-CM | POA: Insufficient documentation

## 2019-05-17 DIAGNOSIS — F329 Major depressive disorder, single episode, unspecified: Secondary | ICD-10-CM | POA: Insufficient documentation

## 2019-05-17 DIAGNOSIS — N183 Chronic kidney disease, stage 3 (moderate): Secondary | ICD-10-CM | POA: Diagnosis not present

## 2019-05-17 DIAGNOSIS — K219 Gastro-esophageal reflux disease without esophagitis: Secondary | ICD-10-CM | POA: Diagnosis not present

## 2019-05-17 DIAGNOSIS — I480 Paroxysmal atrial fibrillation: Secondary | ICD-10-CM | POA: Insufficient documentation

## 2019-05-17 DIAGNOSIS — I509 Heart failure, unspecified: Secondary | ICD-10-CM | POA: Diagnosis not present

## 2019-05-17 DIAGNOSIS — Z794 Long term (current) use of insulin: Secondary | ICD-10-CM | POA: Insufficient documentation

## 2019-05-17 DIAGNOSIS — M109 Gout, unspecified: Secondary | ICD-10-CM | POA: Diagnosis not present

## 2019-05-17 DIAGNOSIS — Z9981 Dependence on supplemental oxygen: Secondary | ICD-10-CM | POA: Diagnosis not present

## 2019-05-17 DIAGNOSIS — Z79899 Other long term (current) drug therapy: Secondary | ICD-10-CM | POA: Insufficient documentation

## 2019-05-17 DIAGNOSIS — I130 Hypertensive heart and chronic kidney disease with heart failure and stage 1 through stage 4 chronic kidney disease, or unspecified chronic kidney disease: Secondary | ICD-10-CM | POA: Insufficient documentation

## 2019-05-17 DIAGNOSIS — E1122 Type 2 diabetes mellitus with diabetic chronic kidney disease: Secondary | ICD-10-CM | POA: Diagnosis not present

## 2019-05-17 DIAGNOSIS — Z950 Presence of cardiac pacemaker: Secondary | ICD-10-CM | POA: Insufficient documentation

## 2019-05-17 DIAGNOSIS — Z8673 Personal history of transient ischemic attack (TIA), and cerebral infarction without residual deficits: Secondary | ICD-10-CM | POA: Diagnosis not present

## 2019-05-17 DIAGNOSIS — I2510 Atherosclerotic heart disease of native coronary artery without angina pectoris: Secondary | ICD-10-CM | POA: Diagnosis not present

## 2019-05-17 DIAGNOSIS — F419 Anxiety disorder, unspecified: Secondary | ICD-10-CM | POA: Insufficient documentation

## 2019-05-17 DIAGNOSIS — K317 Polyp of stomach and duodenum: Secondary | ICD-10-CM | POA: Diagnosis present

## 2019-05-17 DIAGNOSIS — Z96653 Presence of artificial knee joint, bilateral: Secondary | ICD-10-CM | POA: Insufficient documentation

## 2019-05-17 HISTORY — PX: ESOPHAGOGASTRODUODENOSCOPY (EGD) WITH PROPOFOL: SHX5813

## 2019-05-17 LAB — GLUCOSE, CAPILLARY: Glucose-Capillary: 172 mg/dL — ABNORMAL HIGH (ref 70–99)

## 2019-05-17 SURGERY — ESOPHAGOGASTRODUODENOSCOPY (EGD) WITH PROPOFOL
Anesthesia: General

## 2019-05-17 MED ORDER — ONDANSETRON HCL 4 MG/2ML IJ SOLN
4.0000 mg | Freq: Once | INTRAMUSCULAR | Status: AC
  Administered 2019-05-17: 09:00:00 4 mg via INTRAVENOUS

## 2019-05-17 MED ORDER — EPINEPHRINE 1 MG/10ML IJ SOSY
PREFILLED_SYRINGE | INTRAMUSCULAR | Status: AC
  Filled 2019-05-17: qty 10

## 2019-05-17 MED ORDER — EPINEPHRINE PF 1 MG/ML IJ SOLN: INTRAMUSCULAR | Status: DC | PRN

## 2019-05-17 MED ORDER — ONDANSETRON HCL 4 MG/2ML IJ SOLN
INTRAMUSCULAR | Status: AC
  Filled 2019-05-17: qty 2

## 2019-05-17 MED ORDER — PROPOFOL 500 MG/50ML IV EMUL
INTRAVENOUS | Status: DC | PRN
  Administered 2019-05-17: 75 ug/kg/min via INTRAVENOUS

## 2019-05-17 MED ORDER — PROPOFOL 500 MG/50ML IV EMUL
INTRAVENOUS | Status: AC
  Filled 2019-05-17: qty 50

## 2019-05-17 MED ORDER — LIDOCAINE HCL (CARDIAC) PF 100 MG/5ML IV SOSY
PREFILLED_SYRINGE | INTRAVENOUS | Status: DC | PRN
  Administered 2019-05-17: 40 mg via INTRAVENOUS

## 2019-05-17 MED ORDER — SODIUM CHLORIDE 0.9 % IV SOLN
INTRAVENOUS | Status: DC
  Administered 2019-05-17: 08:00:00 via INTRAVENOUS

## 2019-05-17 MED ORDER — LIDOCAINE HCL (PF) 2 % IJ SOLN
INTRAMUSCULAR | Status: AC
  Filled 2019-05-17: qty 10

## 2019-05-17 MED ORDER — PROPOFOL 10 MG/ML IV BOLUS
INTRAVENOUS | Status: DC | PRN
  Administered 2019-05-17 (×2): 20 mg via INTRAVENOUS
  Administered 2019-05-17: 10 mg via INTRAVENOUS
  Administered 2019-05-17: 50 mg via INTRAVENOUS

## 2019-05-17 NOTE — H&P (Signed)
Jonathon Bellows, MD [address], [place], [place], [place], [address] [address], [place], [place], [place], [address] Phone: [phone]  Fax: [phone]  Primary Care Physician:  [name], MD   Pre-Procedure History & Physical: HPI:  Deborah Obrien is a 78 y.o. female is here for an endoscopy    Past Medical History:  Diagnosis Date  . A-fib ([place]) 2016  . Acid reflux   . Anxiety   . Bowel obstruction ([place])   . CHF (congestive heart failure) ([place]) 2017  . Chronic kidney disease (CKD) stage G3a/A1, moderately decreased glomerular filtration rate (GFR) between 45-59 mL/min/1.73 square meter and albuminuria creatinine ratio less than 30 mg/g (HCC)   . Diabetes mellitus without complication ([place])   . Dyspnea   . Fatty liver   . GI bleed   . Gout   . Hypertension   . Morbid obesity ([place])   . Osteoarthritis   . Osteoporosis   . Paroxysmal atrial fibrillation (HCC)   . Presence of permanent cardiac pacemaker   . Sinus node dysfunction ([place]) 08/24/2017  . TIA (transient ischemic attack)     Past Surgical History:  Procedure Laterality Date  . ABDOMINAL HYSTERECTOMY  1980  . APPENDECTOMY    . BACK SURGERY     2008  . BACK SURGERY  2019   patient describes kyphoplasty for compression fractures, MD referral said fusion  . BREAST BIOPSY Right 08/16   stereo fibroadenomatous change, neg for atypia  . BREAST EXCISIONAL BIOPSY Left    neg  . CARDIAC CATHETERIZATION Left 08/25/2016   Procedure: Left Heart Cath and Coronary Angiography;  Surgeon: Corey Skains, MD;  Location: [facility];  Service: Cardiovascular;  Laterality: Left;  . CARDIOVERSION N/A 06/28/2017   Procedure: CARDIOVERSION;  Surgeon: Corey Skains, MD;  Location: [facility];  Service: Cardiovascular;  Laterality: N/A;  . CARDIOVERSION N/A 02/06/2018   Procedure: CARDIOVERSION;  Surgeon: Josue Hector, MD;  Location: [facility];  Service: Cardiovascular;  Laterality:  N/A;  . CATARACT EXTRACTION, BILATERAL  2012  . CHOLECYSTECTOMY    . colon blockage  1999  . COLON SURGERY  1999  . COLONOSCOPY  2016   polyps removed 2016  . DORSAL COMPARTMENT RELEASE Left 09/14/2016   Procedure: RELEASE DORSAL COMPARTMENT (DEQUERVAIN);  Surgeon: Dereck Leep, MD;  Location: [facility];  Service: Orthopedics;  Laterality: Left;  . ESOPHAGOGASTRODUODENOSCOPY N/A 01/27/2015   Procedure: ESOPHAGOGASTRODUODENOSCOPY (EGD);  Surgeon: Lollie Sails, MD;  Location: [facility];  Service: Endoscopy;  Laterality: N/A;  . ESOPHAGOGASTRODUODENOSCOPY (EGD) WITH PROPOFOL N/A 07/24/2015   Procedure: ESOPHAGOGASTRODUODENOSCOPY (EGD) WITH PROPOFOL;  Surgeon: Lucilla Lame, MD;  Location: [facility] ENDOSCOPY;  Service: Endoscopy;  Laterality: N/A;  . ESOPHAGOGASTRODUODENOSCOPY (EGD) WITH PROPOFOL N/A 04/12/2018   Procedure: ESOPHAGOGASTRODUODENOSCOPY (EGD) WITH PROPOFOL;  Surgeon: Jonathon Bellows, MD;  Location: [facility] ENDOSCOPY;  Service: Gastroenterology;  Laterality: N/A;  . ESOPHAGOGASTRODUODENOSCOPY (EGD) WITH PROPOFOL N/A 06/22/2018   Procedure: ESOPHAGOGASTRODUODENOSCOPY (EGD) WITH PROPOFOL;  Surgeon: [name], MD;  Location: [facility] ENDOSCOPY;  Service: Endoscopy;  Laterality: N/A;  . ESOPHAGOGASTRODUODENOSCOPY (EGD) WITH PROPOFOL N/A 07/09/2018   Procedure: ESOPHAGOGASTRODUODENOSCOPY (EGD) WITH PROPOFOL with resection of gastric polyps;  Surgeon: Jonathon Bellows, MD;  Location: [facility] ENDOSCOPY;  Service: Gastroenterology;  Laterality: N/A;  . EUS N/A 06/22/2018   Procedure: UPPER ENDOSCOPIC ULTRASOUND (EUS) RADIAL;  Surgeon: [name], MD;  Location: [facility] ENDOSCOPY;  Service: Endoscopy;  Laterality: N/A;  .  JOINT REPLACEMENT  2014   Bilateral Knee replacement  . LEAD REVISION/REPAIR N/A 08/29/2017   Procedure: LEAD REVISION/REPAIR;  Surgeon: Constance Haw, MD;  Location: [facility];  Service: Cardiovascular;  Laterality: N/A;  . OOPHORECTOMY    . PACEMAKER INSERTION Left  07/01/2015   Procedure: INSERTION PACEMAKER;  Surgeon: Isaias Cowman, MD;  Location: [facility];  Service: Cardiovascular;  Laterality: Left;  . PACEMAKER REVISION N/A 08/28/2017   Procedure: PACEMAKER REVISION;  Surgeon: Deboraha Sprang, MD;  Location: [facility];  Service: Cardiovascular;  Laterality: N/A;  . REPLACEMENT TOTAL KNEE BILATERAL    . SHOULDER ARTHROSCOPY WITH SUBACROMIAL DECOMPRESSION Left 2013  . TEE WITHOUT CARDIOVERSION N/A 06/28/2017   Procedure: TRANSESOPHAGEAL ECHOCARDIOGRAM (TEE);  Surgeon: Corey Skains, MD;  Location: [facility];  Service: Cardiovascular;  Laterality: N/A;    Prior to Admission medications   Medication Sig Start Date End Date Taking? Authorizing Provider  amiodarone (PACERONE) 200 MG tablet Take 1 tablet (200 mg total) by mouth daily. 11/27/18  Yes Deboraha Sprang, MD  furosemide (LASIX) 40 MG tablet Take 1 tablet (40 mg total) by mouth 2 (two) times daily. 04/23/18  Yes Gouru, Illene Silver, MD  gabapentin (NEURONTIN) 400 MG capsule Take by mouth. 02/14/19 02/14/20 Yes [provider]  HYDROcodone-acetaminophen (NORCO) 10-325 MG tablet Take 1 tablet by mouth every 6 (six) hours as needed.   Yes [provider]  insulin NPH-regular Human (NOVOLIN 70/30) (70-30) 100 UNIT/ML injection Inject 22 Units into the skin 2 (two) times daily.    Yes [provider]  levothyroxine (SYNTHROID, LEVOTHROID) 25 MCG tablet TAKE 1 TABLET BY MOUTH ONCE DAILY BEFORE BREAKFAST 12/10/18  Yes Deboraha Sprang, MD  pantoprazole (PROTONIX) 40 MG tablet Take 1 tablet by mouth twice daily 04/01/19  Yes Jonathon Bellows, MD  traZODone (DESYREL) 50 MG tablet Take 50-100 mg by mouth at bedtime.  06/07/18  Yes [provider]  acetaminophen (TYLENOL) 325 MG tablet Take 2 tablets (650 mg total) by mouth every 6 (six) hours as needed for mild pain (or Fever >/= 101). 04/23/18   Nicholes Mango, MD  allopurinol (ZYLOPRIM) 100 MG tablet Take 150 mg by mouth daily.      [provider]  benzocaine-Menthol (DERMOPLAST) 20-0.5 % AERO Apply 1 application topically 4 (four) times daily as needed for irritation.    [provider]  bisacodyl (DULCOLAX) 10 MG suppository Place 1 suppository (10 mg total) rectally daily as needed for moderate constipation. 04/23/18   Gouru, Illene Silver, MD  busPIRone (BUSPAR) 10 MG tablet Take 10 mg by mouth 2 (two) times daily.     [provider]  carboxymethylcellulose (REFRESH PLUS) 0.5 % SOLN 1 drop 2 (two) times daily as needed.    [provider]  colchicine 0.6 MG tablet Take 0.6 mg by mouth daily as needed (gout).     [provider]  dibucaine (NUPERCAINAL) 1 % OINT Place 1 application rectally as needed for hemorrhoids. 04/23/18   Nicholes Mango, MD  ELIQUIS 5 MG TABS tablet TAKE 1 TABLET BY MOUTH TWICE DAILY 10/15/18   Deboraha Sprang, MD  ferrous sulfate 325 (65 FE) MG EC tablet Take 1 tablet (325 mg total) by mouth 2 (two) times daily. 04/23/18 05/23/18  Nicholes Mango, MD  hydrocortisone 2.5 % cream Apply 1 application topically daily as needed (rash).    [provider]  ipratropium-albuterol (DUONEB) 0.5-2.5 (3) MG/3ML SOLN Take 3 mLs by nebulization  2 (two) times daily. 04/23/18   Nicholes Mango, MD  neomycin-bacitracin-polymyxin (NEOSPORIN) ointment Apply topically as needed for wound care. 04/23/18   Nicholes Mango, MD  NON FORMULARY Diet Type: Mechanical Soft Chopped    [provider]  oxybutynin (DITROPAN) 5 MG tablet Take 10 mg by mouth 2 (two) times daily.    [provider]  OXYGEN Inhale 2 L into the lungs continuous.    [provider]  PARoxetine (PAXIL) 40 MG tablet Take 40 mg by mouth every morning.    [provider]  Polyethyl Glycol-Propyl Glycol (SYSTANE) 0.4-0.3 % SOLN Place 1 drop into both eyes 2 (two) times daily as needed.    [provider]  pravastatin (PRAVACHOL) 20 MG tablet Take 20 mg by mouth every evening. 07/25/18    [provider]  senna-docusate (SENOKOT-S) 8.6-50 MG tablet Take 1 tablet by mouth every morning. 04/24/18   Gouru, Illene Silver, MD  sucralfate (CARAFATE) 1 g tablet TAKE 1 TABLET BY MOUTH 4 TIMES DAILY. DISSOLVE IN WATER AND DRINK AS SLURRY 06/15/18   [provider]  talc (ZEASORB) powder Apply 1 application topically as needed (under the breast irritation).    [provider]    Allergies as of 02/26/2019 - Review Complete 02/25/2019  Allergen Reaction Noted  . Cephalosporins Other (See Comments) 01/19/2015  . Fish allergy Shortness Of Breath 11/26/2015  . Macrolides and ketolides Other (See Comments) 01/12/2015  . Meperidine Shortness Of Breath, Nausea Only, and Other (See Comments) 01/19/2015  . Other Nausea Only, Rash, and Other (See Comments) 01/12/2015  . Prednisone Anaphylaxis and Other (See Comments) 01/19/2015  . Shellfish allergy Anaphylaxis, Shortness Of Breath, Diarrhea, Nausea Only, and Rash 06/29/2015  . Sulfa antibiotics Shortness Of Breath, Diarrhea, Nausea Only, and Other (See Comments) 06/25/2013  . Sulfacetamide sodium Diarrhea, Nausea Only, Other (See Comments), and Shortness Of Breath 08/12/2015  . Telbivudine  08/12/2015  . Uloric [febuxostat] Anaphylaxis 08/22/2016  . Aspirin Other (See Comments) 01/19/2015  . Celecoxib Other (See Comments) 01/19/2015  . Cephalexin Diarrhea, Nausea Only, and Other (See Comments) 01/19/2015  . Erythromycin Diarrhea, Nausea Only, and Other (See Comments) 01/19/2015  . Hydromorphone Other (See Comments) 01/19/2015  . Iodinated diagnostic agents Rash and Other (See Comments) 01/19/2015  . Oxycodone Other (See Comments) 01/19/2015  . Aleve [naproxen]  07/03/2018  . Atorvastatin Nausea Only and Other (See Comments) 01/19/2015  . Codeine Diarrhea, Nausea Only, and Other (See Comments) 01/19/2015  . Dilaudid [hydromorphone hcl]  07/03/2018  . Doxycycline  08/24/2017  . Lipitor [atorvastatin calcium] Nausea Only  07/03/2018  . Motrin [ibuprofen]  07/03/2018  . Tape Other (See Comments) 06/29/2015  . Valdecoxib Swelling and Other (See Comments) 01/19/2015  . Iodine Rash 06/29/2015    Family History  Problem Relation Age of Onset  . Diabetes Mellitus II Mother   . CAD Mother   . Heart attack Mother   . Cancer Father        skin  . Breast cancer Neg Hx     Social History   Socioeconomic History  . Marital status: Married    Spouse name: Not on file  . Number of children: Not on file  . Years of education: Not on file  . Highest education level: Not on file  Occupational History  . Occupation: retired  Scientific laboratory technician  . Financial resource strain: Not on file  . Food insecurity    Worry: Not on file    Inability: Not on  file  . Transportation needs    Medical: Not on file    Non-medical: Not on file  Tobacco Use  . Smoking status: Never Smoker  . Smokeless tobacco: Never Used  Substance and Sexual Activity  . Alcohol use: No  . Drug use: No  . Sexual activity: Yes  Lifestyle  . Physical activity    Days per week: Not on file    Minutes per session: Not on file  . Stress: Not on file  Relationships  . Social Herbalist on phone: Not on file    Gets together: Not on file    Attends religious service: Not on file    Active member of club or organization: Not on file    Attends meetings of clubs or organizations: Not on file    Relationship status: Not on file  . Intimate partner violence    Fear of current or ex partner: Not on file    Emotionally abused: Not on file    Physically abused: Not on file    Forced sexual activity: Not on file  Other Topics Concern  . Not on file  Social History Narrative  . Not on file    Review of Systems: See HPI, otherwise negative ROS  Physical Exam: BP 133/84   Pulse 86   Temp (!) 96.7 F (35.9 C) (Tympanic)   Resp 18   Ht 5\' 4"  (1.626 m)   Wt 99.3 kg   SpO2 100%   BMI 37.59 kg/m  General:   Alert,  pleasant and  cooperative in NAD Head:  Normocephalic and atraumatic. Neck:  Supple; no masses or thyromegaly. Lungs:  Clear throughout to auscultation, normal respiratory effort.    Heart:  +S1, +S2, Regular rate and rhythm, No edema. Abdomen:  Soft, nontender and nondistended. Normal bowel sounds, without guarding, and without rebound.   Neurologic:  Alert and  oriented x4;  grossly normal neurologically.  Impression/Plan: Deborah Obrien is here for an endoscopy  to be performed for  evaluation of gastric polyps    Risks, benefits, limitations, and alternatives regarding endoscopy have been reviewed with the patient.  Questions have been answered.  All parties agreeable.   Jonathon Bellows, MD  05/17/2019, 7:57 AM

## 2019-05-17 NOTE — OR Nursing (Signed)
Pt severely nauseated . Dr fitzgerald notified and ordered zofran 4mg  iv once. Pt received med and nausea has decreased

## 2019-05-17 NOTE — Transfer of Care (Signed)
Immediate Anesthesia Transfer of Care Note  Patient: FREDERIKA WETZLER  Procedure(s) Performed: ESOPHAGOGASTRODUODENOSCOPY (EGD) WITH PROPOFOL (N/A )  Patient Location: PACU  Anesthesia Type:General  Level of Consciousness: awake, alert  and oriented  Airway & Oxygen Therapy: Patient Spontanous Breathing and Patient connected to face mask oxygen  Post-op Assessment: Report given to RN and Post -op Vital signs reviewed and stable  Post vital signs: Reviewed and stable  Last Vitals:  Vitals Value Taken Time  BP    Temp    Pulse 59 05/17/19 0849  Resp 17 05/17/19 0849  SpO2 100 % 05/17/19 0849  Vitals shown include unvalidated device data.  Last Pain:  Vitals:   05/17/19 0848  TempSrc: (P) Tympanic         Complications: No apparent anesthesia complications

## 2019-05-17 NOTE — Anesthesia Postprocedure Evaluation (Signed)
Anesthesia Post Note  Patient: Deborah Obrien  Procedure(s) Performed: ESOPHAGOGASTRODUODENOSCOPY (EGD) WITH PROPOFOL (N/A )  Patient location during evaluation: PACU Anesthesia Type: General Level of consciousness: awake and alert Pain management: pain level controlled Vital Signs Assessment: post-procedure vital signs reviewed and stable Respiratory status: spontaneous breathing, nonlabored ventilation and respiratory function stable Cardiovascular status: blood pressure returned to baseline and stable Postop Assessment: no apparent nausea or vomiting Anesthetic complications: no     Last Vitals:  Vitals:   05/17/19 0848 05/17/19 0853  BP: (!) 103/52 (!) 142/78  Pulse:    Resp:    Temp: (!) 36.1 C   SpO2:      Last Pain:  Vitals:   05/17/19 0944  TempSrc:   PainSc: [place]

## 2019-05-17 NOTE — Op Note (Signed)
[facility] Gastroenterology Patient Name: Deborah Obrien Procedure Date: 05/17/2019 7:56 AM MRN: [id number] Account #: [id number] Date of Birth: 1940/12/07 Admit Type: Outpatient Age: 78 Room: [facility] ENDO ROOM 4 Gender: Female Note Status: Finalized Procedure:            Upper GI endoscopy Indications:          Follow-up of gastric polyps Providers:            Jonathon Bellows MD, MD Referring MD:         Ocie Cornfield. Ouida Sills MD, MD (Referring MD) Medicines:            Monitored Anesthesia Care Complications:        No immediate complications. Procedure:            Pre-Anesthesia Assessment:                       - Prior to the procedure, a History and Physical was                        performed, and patient medications, allergies and                        sensitivities were reviewed. The patient's tolerance of                        previous anesthesia was reviewed.                       - ASA Grade Assessment: III - A patient with severe                        systemic disease.                       After obtaining informed consent, the endoscope was                        passed under direct vision. Throughout the procedure,                        the patient's blood pressure, pulse, and oxygen                        saturations were monitored continuously. The Endoscope                        was introduced through the mouth, and advanced to the                        third part of duodenum. The upper GI endoscopy was                        accomplished with ease. The patient tolerated the                        procedure well. Findings:      The examined duodenum was normal.      The esophagus was normal.      Multiple 4 to 7 mm sessile polyps with no bleeding and no stigmata of       recent bleeding were  found on the greater curvature of the stomach and       in the gastric antrum. The polyp was removed with a hot snare. Resection       and retrieval were  complete. To prevent bleeding after the polypectomy,       three hemostatic clips were successfully placed. There was no bleeding       during, or at the end, of the procedure.      A single 15 mm pedunculated polyp with no bleeding and stigmata of       recent bleeding was found in the cardia. Area was successfully injected       with 5 mL saline for a lift polypectomy. The polyp was removed with a       hot snare. Resection and retrieval were complete. Area was successfully       injected with 3 mL of a 1:10,000 solution of epinephrine for hemostasis.       To prevent bleeding after the polypectomy, three hemostatic clips were       successfully placed. There was no bleeding at the end of the procedure.      The exam was otherwise without abnormality. Impression:           - Normal examined duodenum.                       - Normal esophagus.                       - Multiple gastric polyps. Resected and retrieved.                        Clips were placed.                       - A single gastric polyp. Resected and retrieved.                        Injected. Clips were placed.                       - The examination was otherwise normal. Recommendation:       - Discharge patient to home (with escort).                       - Resume previous diet.                       - Continue present medications.                       - Resume Eloquis tomorrow night                       Continue Prilosec 40 mg daily                       No NSAID's                       - Await pathology results.                       - Repeat upper endoscopy for surveillance based on  pathology results.                       - Return to GI office in 6 weeks. Procedure Code(s):    --- Professional ---                       [phone], 59, Esophagogastroduodenoscopy, flexible,                        transoral; with control of bleeding, any method                       43251,  Esophagogastroduodenoscopy, flexible, transoral;                        with removal of tumor(s), polyp(s), or other lesion(s)                        by snare technique                       43236, 72, Esophagogastroduodenoscopy, flexible,                        transoral; with directed submucosal injection(s), any                        substance Diagnosis Code(s):    --- Professional ---                       K31.7, Polyp of stomach and duodenum CPT copyright 2019 American Medical Association. All rights reserved. The codes documented in this report are preliminary and upon coder review may  be revised to meet current compliance requirements. Jonathon Bellows, MD Jonathon Bellows MD, MD 05/17/2019 8:46:37 AM This report has been signed electronically. Number of Addenda: 0 Note Initiated On: 05/17/2019 7:56 AM Estimated Blood Loss: Estimated blood loss: none.      [facility]

## 2019-05-17 NOTE — Anesthesia Preprocedure Evaluation (Signed)
Anesthesia Evaluation  Patient identified by MRN, date of birth, ID band Patient awake    Reviewed: Allergy & Precautions, H&P , NPO status , Patient's Chart, lab work & pertinent test results  History of Anesthesia Complications Negative for: history of anesthetic complications  Airway Mallampati: III  TM Distance: >3 FB     Dental  (+) Upper Dentures   Pulmonary shortness of breath and with exertion,           Cardiovascular Exercise Tolerance: Poor hypertension, + CAD, +CHF and + DOE  + pacemaker   - Left ventricle: The cavity size was normal. Wall thickness was   normal. Systolic function was normal. The estimated ejection   fraction was in the range of 60% to 65%. - Mitral valve: Moderately thickened, mildly calcified leaflets . - Left atrium: The atrium was mildly dilated. - Right ventricle: The cavity size was mildly to moderately   dilated. Wall thickness was mildly increased. - Right atrium: The atrium was mildly dilated. - Tricuspid valve: There was moderate regurgitation. - Pulmonary arteries: Systolic pressure was moderately increased,   estimated to be 50 mm Hg plus central venous/right atrial   pressure.   Neuro/Psych PSYCHIATRIC DISORDERS Anxiety Depression Chronic back pain on opioid therapy TIA   GI/Hepatic Neg liver ROS, GERD  ,  Endo/Other  negative endocrine ROSdiabetes  Renal/GU CRFRenal disease  negative genitourinary   Musculoskeletal   Abdominal   Peds  Hematology negative hematology ROS (+)   Anesthesia Other Findings Past Medical History: 2016: A-fib ([place]) No date: Acid reflux No date: Anxiety No date: Bowel obstruction ([place]) 2017: CHF (congestive heart failure) (HCC) No date: Chronic kidney disease (CKD) stage G3a/A1, moderately  decreased glomerular filtration rate (GFR) between 45-59 mL/min/1.73  square meter and albuminuria creatinine ratio less than 30 mg/g (HCC) No date:  Diabetes mellitus without complication (HCC) No date: Dyspnea No date: Fatty liver No date: GI bleed No date: Gout No date: Hypertension No date: Morbid obesity ([place]) No date: Osteoarthritis No date: Osteoporosis No date: Paroxysmal atrial fibrillation (HCC) No date: Presence of permanent cardiac pacemaker 08/24/2017: Sinus node dysfunction (HCC) No date: TIA (transient ischemic attack)  Past Surgical History: 1980: ABDOMINAL HYSTERECTOMY No date: APPENDECTOMY No date: BACK SURGERY     Comment:  2008 2019: BACK SURGERY     Comment:  patient describes kyphoplasty for compression fractures,              MD referral said fusion 08/16: BREAST BIOPSY; Right     Comment:  stereo fibroadenomatous change, neg for atypia No date: BREAST EXCISIONAL BIOPSY; Left     Comment:  neg 08/25/2016: CARDIAC CATHETERIZATION; Left     Comment:  Procedure: Left Heart Cath and Coronary Angiography;                Surgeon: Corey Skains, MD;  Location: [place]               CV LAB;  Service: Cardiovascular;  Laterality: Left; 06/28/2017: CARDIOVERSION; N/A     Comment:  Procedure: CARDIOVERSION;  Surgeon: Corey Skains,               MD;  Location: [facility] ORS;  Service: Cardiovascular;                Laterality: N/A; 02/06/2018: CARDIOVERSION; N/A     Comment:  Procedure: CARDIOVERSION;  Surgeon: Josue Hector, MD;  Location: [place] ENDOSCOPY;  Service: Cardiovascular;                Laterality: N/A; 2012: CATARACT EXTRACTION, BILATERAL No date: CHOLECYSTECTOMY 1999: colon blockage 1999: COLON SURGERY 2016: COLONOSCOPY     Comment:  polyps removed 2016 09/14/2016: DORSAL COMPARTMENT RELEASE; Left     Comment:  Procedure: RELEASE DORSAL COMPARTMENT (DEQUERVAIN);                Surgeon: Dereck Leep, MD;  Location: [facility] ORS;                Service: Orthopedics;  Laterality: Left; 01/27/2015: ESOPHAGOGASTRODUODENOSCOPY; N/A     Comment:  Procedure: ESOPHAGOGASTRODUODENOSCOPY  (EGD);  Surgeon:               Lollie Sails, MD;  Location: [facility] ENDOSCOPY;                Service: Endoscopy;  Laterality: N/A; 07/24/2015: ESOPHAGOGASTRODUODENOSCOPY (EGD) WITH PROPOFOL; N/A     Comment:  Procedure: ESOPHAGOGASTRODUODENOSCOPY (EGD) WITH               PROPOFOL;  Surgeon: Lucilla Lame, MD;  Location: [facility]               ENDOSCOPY;  Service: Endoscopy;  Laterality: N/A; 04/12/2018: ESOPHAGOGASTRODUODENOSCOPY (EGD) WITH PROPOFOL; N/A     Comment:  Procedure: ESOPHAGOGASTRODUODENOSCOPY (EGD) WITH               PROPOFOL;  Surgeon: Jonathon Bellows, MD;  Location: [facility]               ENDOSCOPY;  Service: Gastroenterology;  Laterality: N/A; 06/22/2018: ESOPHAGOGASTRODUODENOSCOPY (EGD) WITH PROPOFOL; N/A     Comment:  Procedure: ESOPHAGOGASTRODUODENOSCOPY (EGD) WITH               PROPOFOL;  Surgeon: [name], MD;  Location: [facility]               ENDOSCOPY;  Service: Endoscopy;  Laterality: N/A; 07/09/2018: ESOPHAGOGASTRODUODENOSCOPY (EGD) WITH PROPOFOL; N/A     Comment:  Procedure: ESOPHAGOGASTRODUODENOSCOPY (EGD) WITH               PROPOFOL with resection of gastric polyps;  Surgeon:               Jonathon Bellows, MD;  Location: [facility] ENDOSCOPY;  Service:               Gastroenterology;  Laterality: N/A; 06/22/2018: EUS; N/A     Comment:  Procedure: UPPER ENDOSCOPIC ULTRASOUND (EUS) RADIAL;                Surgeon: [name], MD;  Location: [facility] ENDOSCOPY;                Service: Endoscopy;  Laterality: N/A; 2014: JOINT REPLACEMENT     Comment:  Bilateral Knee replacement 08/29/2017: LEAD REVISION/REPAIR; N/A     Comment:  Procedure: LEAD REVISION/REPAIR;  Surgeon: Constance Haw, MD;  Location: [place] CV LAB;  Service:               Cardiovascular;  Laterality: N/A; No date: OOPHORECTOMY 07/01/2015: PACEMAKER INSERTION; Left     Comment:  Procedure: INSERTION PACEMAKER;  Surgeon: Isaias Cowman, MD;  Location: [facility] ORS;  Service:  Cardiovascular;  Laterality: Left; 08/28/2017: PACEMAKER REVISION; N/A     Comment:  Procedure: PACEMAKER REVISION;  Surgeon: Deboraha Sprang, MD;  Location: [place] CV LAB;  Service:               Cardiovascular;  Laterality: N/A; No date: REPLACEMENT TOTAL KNEE BILATERAL 2013: SHOULDER ARTHROSCOPY WITH SUBACROMIAL DECOMPRESSION; Left 06/28/2017: TEE WITHOUT CARDIOVERSION; N/A     Comment:  Procedure: TRANSESOPHAGEAL ECHOCARDIOGRAM (TEE);                Surgeon: Corey Skains, MD;  Location: [facility] ORS;                Service: Cardiovascular;  Laterality: N/A;  BMI    Body Mass Index: 37.59 kg/m      Reproductive/Obstetrics negative OB ROS                             Anesthesia Physical Anesthesia Plan  ASA: III  Anesthesia Plan: General   Post-op Pain Management:    Induction:   PONV Risk Score and Plan: Propofol infusion and TIVA  Airway Management Planned: Natural Airway and Nasal Cannula  Additional Equipment:   Intra-op Plan:   Post-operative Plan:   Informed Consent: I have reviewed the patients History and Physical, chart, labs and discussed the procedure including the risks, benefits and alternatives for the proposed anesthesia with the patient or authorized representative who has indicated his/her understanding and acceptance.     Dental Advisory Given  Plan Discussed with: Anesthesiologist and CRNA  Anesthesia Plan Comments:         Anesthesia Quick Evaluation

## 2019-05-17 NOTE — Anesthesia Post-op Follow-up Note (Signed)
Anesthesia QCDR form completed.        

## 2019-05-21 ENCOUNTER — Other Ambulatory Visit: Payer: Self-pay

## 2019-05-21 ENCOUNTER — Encounter: Payer: Self-pay | Admitting: Gastroenterology

## 2019-05-21 ENCOUNTER — Ambulatory Visit (INDEPENDENT_AMBULATORY_CARE_PROVIDER_SITE_OTHER): Payer: Medicare Other | Admitting: Internal Medicine

## 2019-05-21 ENCOUNTER — Encounter: Payer: Self-pay | Admitting: Internal Medicine

## 2019-05-21 VITALS — BP 100/60 | HR 63 | Ht 64.5 in | Wt 213.0 lb

## 2019-05-21 DIAGNOSIS — I480 Paroxysmal atrial fibrillation: Secondary | ICD-10-CM

## 2019-05-21 DIAGNOSIS — I495 Sick sinus syndrome: Secondary | ICD-10-CM

## 2019-05-21 DIAGNOSIS — Z950 Presence of cardiac pacemaker: Secondary | ICD-10-CM

## 2019-05-21 LAB — CUP PACEART INCLINIC DEVICE CHECK
Brady Statistic RA Percent Paced: 99.6 %
Brady Statistic RV Percent Paced: 0.1 %
Date Time Interrogation Session: 20200908105932
Implantable Lead Implant Date: 20161019
Implantable Lead Implant Date: 20181217
Implantable Lead Location: 753859
Implantable Lead Location: 753860
Implantable Lead Model: 5076
Implantable Lead Model: 5076
Implantable Pulse Generator Implant Date: 20181217
Lead Channel Impedance Value: 380 Ohm
Lead Channel Impedance Value: 418 Ohm
Lead Channel Pacing Threshold Amplitude: 0.5 V
Lead Channel Pacing Threshold Amplitude: 1 V
Lead Channel Pacing Threshold Pulse Width: 0.4 ms
Lead Channel Pacing Threshold Pulse Width: 0.4 ms
Lead Channel Sensing Intrinsic Amplitude: 4.8 mV
Lead Channel Sensing Intrinsic Amplitude: 5.6 mV
Lead Channel Setting Pacing Amplitude: 2 V
Lead Channel Setting Pacing Amplitude: 2.5 V
Lead Channel Setting Pacing Pulse Width: 0.4 ms
Lead Channel Setting Sensing Sensitivity: 1.2 mV

## 2019-05-21 LAB — SURGICAL PATHOLOGY

## 2019-05-21 MED ORDER — AMIODARONE HCL 200 MG PO TABS: 200.0000 mg | ORAL_TABLET | Freq: Every day | ORAL | Status: DC

## 2019-05-21 NOTE — Progress Notes (Signed)
Inform polyps were not malignant- F/u in my office to discuss future plans of endoscopy

## 2019-05-21 NOTE — Patient Instructions (Signed)
Medication Instructions:  - Your physician has recommended you make the following change in your medication:   1) Amiodarone 200 mg- take 1 tablet every morning  If you need a refill on your cardiac medications before your next appointment, please call your pharmacy.   Lab work: - none ordered  If you have labs (blood work) drawn today and your tests are completely normal, you will receive your results only by: Marland Kitchen MyChart Message (if you have MyChart) OR . A paper copy in the mail If you have any lab test that is abnormal or we need to change your treatment, we will call you to review the results.  Testing/Procedures: - none ordered  Follow-Up: At [facility], you and your health needs are our priority.  As part of our continuing mission to provide you with exceptional heart care, we have created designated Provider Care Teams.  These Care Teams include your primary Cardiologist (physician) and Advanced Practice Providers (APPs -  Physician Assistants and Nurse Practitioners) who all work together to provide you with the care you need, when you need it.  You will need a follow up appointment in 6 months (March 2021) with [name]. Please call our office 2 months in advance to schedule this appointment.  (Call in early January 2021 to schedule).  Remote monitoring is used to monitor your Pacemaker of ICD from home. This monitoring reduces the number of office visits required to check your device to one time per year. It allows [place] to keep an eye on the functioning of your device to ensure it is working properly. You are scheduled for a device check from home on 05/28/19. You may send your transmission at any time that day. If you have a wireless device, the transmission will be sent automatically. After your physician reviews your transmission, you will receive a postcard with your next transmission date.   Any Other Special Instructions Will Be Listed Below (If Applicable). - N/A

## 2019-05-21 NOTE — Progress Notes (Signed)
Patient Care Team: [name], MD as PCP - General (Internal Medicine) Deboraha Sprang, MD as PCP - Cardiology (Cardiology) [name], MD (Internal Medicine) Bary Castilla Forest Gleason, MD (General Surgery)   HPI  Deborah Obrien is a 78 y.o. female Seen for sinus node dysfunction exercise intolerance and previously implanted single chamber pacemaker for which she underwent dual chamber upgrade 12/18 cx by encircling of the chronic RV lead by the atrial lead resulting in tension for which she underwent RA lead revision   She has hx of atrial arrhythmias, with atrial fib and flutter, typical/atypical all documented.     Admitted 5/19 with recurrent atrial flutter complicated by congestive heart failure.  She was restarted on amiodarone (having been discontinued 12/18) and discharged on a diuretic.  Hospitalized for most of the month of August 2019 because of GI bleeding.     Recurrent atrial flutter 9/19 pace terminated in the office.   Biggest issue remains severe back pain,  Intercurrent year notable for gout, shingles broken ribs and polyps.  It limits her ambulation to less than 10-20 feet     11/16  Echo  EF 55-60   12/17 Echo  EF 45% Stress neg  12/17 Cath  Nonobstructive CAD  Mr 2+  2/19 Myoview EF 55-65% No ischemia  7/19 Echo  EF 60-65% Mild RVE     Date Cr K Hgb TSH LFTs  10/18 1.72  13.5    12/18 1.32  13.5    6/19 1.57 5.5>>4.0 13.7 2.95 71(5/19  8/19 1.53 3.8 11.8>>12.7  14  10/19   12.2 (11/19)  20  1/20 1.94 4.3 11.5 10.67 21  5/20 1.6 4.8  4.135 50   Patient denies symptoms of GI intolerance, sun sensitivity, neurological symptoms attributable to amiodarone.     Past Medical History:  Diagnosis Date  . A-fib ([place]) 2016  . Acid reflux   . Anxiety   . Bowel obstruction ([place])   . CHF (congestive heart failure) ([place]) 2017  . Chronic kidney disease (CKD) stage G3a/A1, moderately decreased glomerular filtration rate (GFR) between  45-59 mL/min/1.73 square meter and albuminuria creatinine ratio less than 30 mg/g (HCC)   . Diabetes mellitus without complication ([place])   . Dyspnea   . Fatty liver   . GI bleed   . Gout   . Hypertension   . Morbid obesity ([place])   . Osteoarthritis   . Osteoporosis   . Paroxysmal atrial fibrillation (HCC)   . Presence of permanent cardiac pacemaker   . Sinus node dysfunction ([place]) 08/24/2017  . TIA (transient ischemic attack)     Past Surgical History:  Procedure Laterality Date  . ABDOMINAL HYSTERECTOMY  1980  . APPENDECTOMY    . BACK SURGERY     2008  . BACK SURGERY  2019   patient describes kyphoplasty for compression fractures, MD referral said fusion  . BREAST BIOPSY Right 08/16   stereo fibroadenomatous change, neg for atypia  . BREAST EXCISIONAL BIOPSY Left    neg  . CARDIAC CATHETERIZATION Left 08/25/2016   Procedure: Left Heart Cath and Coronary Angiography;  Surgeon: Corey Skains, MD;  Location: [place] CV LAB;  Service: Cardiovascular;  Laterality: Left;  . CARDIOVERSION N/A 06/28/2017   Procedure: CARDIOVERSION;  Surgeon: Corey Skains, MD;  Location: [facility] ORS;  Service: Cardiovascular;  Laterality: N/A;  . CARDIOVERSION N/A 02/06/2018   Procedure: CARDIOVERSION;  Surgeon: Josue Hector, MD;  Location: [place];  Service: Cardiovascular;  Laterality: N/A;  . CATARACT EXTRACTION, BILATERAL  2012  . CHOLECYSTECTOMY    . colon blockage  1999  . COLON SURGERY  1999  . COLONOSCOPY  2016   polyps removed 2016  . DORSAL COMPARTMENT RELEASE Left 09/14/2016   Procedure: RELEASE DORSAL COMPARTMENT (DEQUERVAIN);  Surgeon: Dereck Leep, MD;  Location: [facility] ORS;  Service: Orthopedics;  Laterality: Left;  . ESOPHAGOGASTRODUODENOSCOPY N/A 01/27/2015   Procedure: ESOPHAGOGASTRODUODENOSCOPY (EGD);  Surgeon: Lollie Sails, MD;  Location: [facility] ENDOSCOPY;  Service: Endoscopy;  Laterality: N/A;  . ESOPHAGOGASTRODUODENOSCOPY (EGD) WITH PROPOFOL N/A 07/24/2015    Procedure: ESOPHAGOGASTRODUODENOSCOPY (EGD) WITH PROPOFOL;  Surgeon: Lucilla Lame, MD;  Location: [facility] ENDOSCOPY;  Service: Endoscopy;  Laterality: N/A;  . ESOPHAGOGASTRODUODENOSCOPY (EGD) WITH PROPOFOL N/A 04/12/2018   Procedure: ESOPHAGOGASTRODUODENOSCOPY (EGD) WITH PROPOFOL;  Surgeon: Jonathon Bellows, MD;  Location: [facility] ENDOSCOPY;  Service: Gastroenterology;  Laterality: N/A;  . ESOPHAGOGASTRODUODENOSCOPY (EGD) WITH PROPOFOL N/A 06/22/2018   Procedure: ESOPHAGOGASTRODUODENOSCOPY (EGD) WITH PROPOFOL;  Surgeon: [name], MD;  Location: [facility] ENDOSCOPY;  Service: Endoscopy;  Laterality: N/A;  . ESOPHAGOGASTRODUODENOSCOPY (EGD) WITH PROPOFOL N/A 07/09/2018   Procedure: ESOPHAGOGASTRODUODENOSCOPY (EGD) WITH PROPOFOL with resection of gastric polyps;  Surgeon: Jonathon Bellows, MD;  Location: [facility] ENDOSCOPY;  Service: Gastroenterology;  Laterality: N/A;  . ESOPHAGOGASTRODUODENOSCOPY (EGD) WITH PROPOFOL N/A 05/17/2019   Procedure: ESOPHAGOGASTRODUODENOSCOPY (EGD) WITH PROPOFOL;  Surgeon: Jonathon Bellows, MD;  Location: [facility] ENDOSCOPY;  Service: Gastroenterology;  Laterality: N/A;  . EUS N/A 06/22/2018   Procedure: UPPER ENDOSCOPIC ULTRASOUND (EUS) RADIAL;  Surgeon: [name], MD;  Location: [facility] ENDOSCOPY;  Service: Endoscopy;  Laterality: N/A;  . JOINT REPLACEMENT  2014   Bilateral Knee replacement  . LEAD REVISION/REPAIR N/A 08/29/2017   Procedure: LEAD REVISION/REPAIR;  Surgeon: Constance Haw, MD;  Location: [place] CV LAB;  Service: Cardiovascular;  Laterality: N/A;  . OOPHORECTOMY    . PACEMAKER INSERTION Left 07/01/2015   Procedure: INSERTION PACEMAKER;  Surgeon: Isaias Cowman, MD;  Location: [facility] ORS;  Service: Cardiovascular;  Laterality: Left;  . PACEMAKER REVISION N/A 08/28/2017   Procedure: PACEMAKER REVISION;  Surgeon: Deboraha Sprang, MD;  Location: [place] CV LAB;  Service: Cardiovascular;  Laterality: N/A;  . REPLACEMENT TOTAL KNEE BILATERAL    . SHOULDER ARTHROSCOPY  WITH SUBACROMIAL DECOMPRESSION Left 2013  . TEE WITHOUT CARDIOVERSION N/A 06/28/2017   Procedure: TRANSESOPHAGEAL ECHOCARDIOGRAM (TEE);  Surgeon: Corey Skains, MD;  Location: [facility] ORS;  Service: Cardiovascular;  Laterality: N/A;    Current Outpatient Medications  Medication Sig Dispense Refill  . acetaminophen (TYLENOL) 325 MG tablet Take 2 tablets (650 mg total) by mouth every 6 (six) hours as needed for mild pain (or Fever >/= 101).    Marland Kitchen allopurinol (ZYLOPRIM) 100 MG tablet Take 150 mg by mouth daily.     Marland Kitchen amiodarone (PACERONE) 200 MG tablet Take 1 tablet (200 mg total) by mouth daily.    . benzocaine-Menthol (DERMOPLAST) 20-0.5 % AERO Apply 1 application topically 4 (four) times daily as needed for irritation.    . bisacodyl (DULCOLAX) 10 MG suppository Place 1 suppository (10 mg total) rectally daily as needed for moderate constipation. 12 suppository 0  . busPIRone (BUSPAR) 10 MG tablet Take 10 mg by mouth 2 (two) times daily.     . carboxymethylcellulose (REFRESH PLUS) 0.5 % SOLN 1 drop 2 (two) times daily as needed.    . colchicine 0.6 MG tablet Take 0.6 mg by mouth daily  as needed (gout).     . dibucaine (NUPERCAINAL) 1 % OINT Place 1 application rectally as needed for hemorrhoids.  0  . ELIQUIS 5 MG TABS tablet TAKE 1 TABLET BY MOUTH TWICE DAILY 60 tablet 3  . ferrous sulfate 325 (65 FE) MG EC tablet Take 1 tablet (325 mg total) by mouth 2 (two) times daily. 60 tablet 3  . furosemide (LASIX) 40 MG tablet Take 1 tablet (40 mg total) by mouth 2 (two) times daily. 30 tablet   . gabapentin (NEURONTIN) 400 MG capsule Take by mouth.    Marland Kitchen HYDROcodone-acetaminophen (NORCO) 10-325 MG tablet Take 1 tablet by mouth every 6 (six) hours as needed.    . hydrocortisone 2.5 % cream Apply 1 application topically daily as needed (rash).    . insulin NPH-regular Human (NOVOLIN 70/30) (70-30) 100 UNIT/ML injection Inject 22 Units into the skin 2 (two) times daily.     Marland Kitchen ipratropium-albuterol  (DUONEB) 0.5-2.5 (3) MG/3ML SOLN Take 3 mLs by nebulization 2 (two) times daily. 360 mL   . levothyroxine (SYNTHROID, LEVOTHROID) 25 MCG tablet TAKE 1 TABLET BY MOUTH ONCE DAILY BEFORE BREAKFAST 90 tablet 1  . neomycin-bacitracin-polymyxin (NEOSPORIN) ointment Apply topically as needed for wound care. 15 g 0  . NON FORMULARY Diet Type: Mechanical Soft Chopped    . oxybutynin (DITROPAN) 5 MG tablet Take 10 mg by mouth 2 (two) times daily.    . OXYGEN Inhale 2 L into the lungs continuous.    . pantoprazole (PROTONIX) 40 MG tablet Take 1 tablet by mouth twice daily 180 tablet 1  . PARoxetine (PAXIL) 40 MG tablet Take 40 mg by mouth every morning.    Vladimir Faster Glycol-Propyl Glycol (SYSTANE) 0.4-0.3 % SOLN Place 1 drop into both eyes 2 (two) times daily as needed.    . pravastatin (PRAVACHOL) 20 MG tablet Take 20 mg by mouth every evening.  2  . senna-docusate (SENOKOT-S) 8.6-50 MG tablet Take 1 tablet by mouth every morning.    . sucralfate (CARAFATE) 1 g tablet TAKE 1 TABLET BY MOUTH 4 TIMES DAILY. DISSOLVE IN WATER AND DRINK AS SLURRY  1  . talc (ZEASORB) powder Apply 1 application topically as needed (under the breast irritation).    . traZODone (DESYREL) 50 MG tablet Take 50-100 mg by mouth at bedtime.      No current facility-administered medications for this visit.     Allergies  Allergen Reactions  . Cephalosporins Other (See Comments)    Reaction:  Unknown   . Fish Allergy Shortness Of Breath  . Macrolides And Ketolides Other (See Comments)    Reaction:  Unknown  Other Reaction: Intolerance Other Reaction: Intolerance   . Meperidine Shortness Of Breath, Nausea Only and Other (See Comments)    Reaction:  Stomach pain   . Other Nausea Only, Rash and Other (See Comments)    Uncoded Allergy. Allergen: NON-STEROIDS, Other Reaction: Not Assessed bextra - hands and feet swell Shellfish - SOB Shellfish - SOB Other Reaction: Not Assessed  . Prednisone Anaphylaxis and Other (See  Comments)    Reaction:  Increases pts blood sugar  Pt states that she is allergic to all steroids.    . Shellfish Allergy Anaphylaxis, Shortness Of Breath, Diarrhea, Nausea Only and Rash    Reaction:  Stomach pain  Other reaction(s): Other (See Comments) Reaction:Stomach pain   . Sulfa Antibiotics Shortness Of Breath, Diarrhea, Nausea Only and Other (See Comments)    Reaction:  Stomach pain  Reaction:Stomach  pain  Other Reaction: Intolerance   . Sulfacetamide Sodium Diarrhea, Nausea Only, Other (See Comments) and Shortness Of Breath    Reaction:  Stomach pain   . Telbivudine     Other reaction(s): Other (See Comments) Reaction:Unknown   . Uloric [Febuxostat] Anaphylaxis    Locks pt's body up   . Aspirin Other (See Comments)    Reaction:  Stomach pain   . Celecoxib Other (See Comments)    Reaction:  GI bleed, weakness, and stomach pain.    . Cephalexin Diarrhea, Nausea Only and Other (See Comments)    Reaction:  Stomach pain   . Erythromycin Diarrhea, Nausea Only and Other (See Comments)    Reaction:  Stomach pain   . Hydromorphone Other (See Comments)    Reaction:  Hypotension   . Iodinated Diagnostic Agents Rash and Other (See Comments)    Pt states that she is unable to have because she has chronic kidney disease.   Pt states that she is unable to have because she has chronic kidney disease. CKD CKD  . Oxycodone Other (See Comments)    Reaction:  Stomach pain   . Aleve [Naproxen]     Reaction: severe stomach pain  . Atorvastatin Nausea Only and Other (See Comments)    Reaction:  Weakness   . Codeine Diarrhea, Nausea Only and Other (See Comments)    Reaction:  Stomach pain Pt tolerates morphine   . Dilaudid [Hydromorphone Hcl]     Reactions: easy to overdose - blood pressure drops really low  . Doxycycline     Stomach pain   . Lipitor [Atorvastatin Calcium] Nausea Only    Reaction: nausea, weakness, pass blood  . Motrin [Ibuprofen]     Reaction: severe  stomach pain  . Tape Other (See Comments)    Reaction:  Causes pts skin to tear  Pt states that she is able to use paper tape.      . Valdecoxib Swelling and Other (See Comments)    Pt states that her hands and feet swell.    . Iodine Rash      Review of Systems negative except from HPI and PMH  Physical Exam Ht 5' 4.5" (1.638 m)   Wt 213 lb (96.6 kg)   BMI 36.00 kg/m  Well developed and Morbidly obese  in no acute distress HENT normal Neck supple with JVP-flat Clear Device pocket well healed; without hematoma or erythema.  There is no tethering  Regular rate and rhythm, no   murmur Abd-soft with active BS No Clubbing cyanosis  edema Skin-warm and dry A & Oriented  Grossly normal sensory and motor function  ECG Apacing 63 21/09/48  Assessment and  Plan  Sinus node dysfunction  Paroxysmal atrial fibrillation/flutter with a rapid ventricular response  High Risk Medication Surveillance--Amiodarone  Cardiomyopathy-interval normalization   Diabetes mellitus with nephropathy  Orthostatic intolerance and falls  HFpEF  Hypotension  Back pain chronic  RV enlargement  Thromboembolic risk profile as noted above   Medtronic pacemaker upgraded to dual chamber .  The patient's device was interrogated.  The information was reviewed. No changes were made in the programming.        No intercurrent atrial fibrillation or flutter Tolerating amio Euvolemic continue current meds Orthostasis quiescent

## 2019-05-23 ENCOUNTER — Telehealth: Payer: Self-pay | Admitting: Gastroenterology

## 2019-05-23 ENCOUNTER — Other Ambulatory Visit: Payer: Self-pay

## 2019-05-23 MED ORDER — SUCRALFATE 1 GM/10ML PO SUSP: 1.0000 g | Freq: Four times a day (QID) | ORAL | 2 refills | Status: DC

## 2019-05-23 NOTE — Telephone Encounter (Signed)
Pt left vm she statets she had an Endoscopy with  Dr. Vicente Males and her throat is giving her a hard time swallowing please call pt today

## 2019-05-24 NOTE — Telephone Encounter (Signed)
Spoke with pt yesterday and informed her of Dr. Georgeann Oppenheim suggestion to commence on carafate and to follow up with him to discuss next steps. Pt agrees to a televisit.

## 2019-05-27 ENCOUNTER — Telehealth: Payer: Self-pay | Admitting: Gastroenterology

## 2019-05-27 NOTE — Telephone Encounter (Signed)
Spoke with pt (see other phone encounter)

## 2019-05-27 NOTE — Telephone Encounter (Signed)
Pt left several vm  To speak with Nurse

## 2019-05-27 NOTE — Telephone Encounter (Signed)
Another option is to try OTC gaviscon

## 2019-05-28 ENCOUNTER — Ambulatory Visit (INDEPENDENT_AMBULATORY_CARE_PROVIDER_SITE_OTHER): Payer: Medicare Other | Admitting: *Deleted

## 2019-05-28 DIAGNOSIS — I495 Sick sinus syndrome: Secondary | ICD-10-CM

## 2019-05-28 DIAGNOSIS — I4891 Unspecified atrial fibrillation: Secondary | ICD-10-CM | POA: Diagnosis not present

## 2019-05-28 LAB — CUP PACEART REMOTE DEVICE CHECK
Battery Remaining Longevity: 135 mo
Battery Voltage: 2.99 V
Brady Statistic AP VP Percent: 0.05 %
Brady Statistic AP VS Percent: 99.55 %
Brady Statistic AS VP Percent: 0 %
Brady Statistic AS VS Percent: 0.4 %
Brady Statistic RA Percent Paced: 99.6 %
Brady Statistic RV Percent Paced: 0.05 %
Date Time Interrogation Session: 20200915052759
Implantable Lead Implant Date: 20161019
Implantable Lead Implant Date: 20181217
Implantable Lead Location: 753859
Implantable Lead Location: 753860
Implantable Lead Model: 5076
Implantable Lead Model: 5076
Implantable Pulse Generator Implant Date: 20181217
Lead Channel Impedance Value: 323 Ohm
Lead Channel Impedance Value: 342 Ohm
Lead Channel Impedance Value: 399 Ohm
Lead Channel Impedance Value: 418 Ohm
Lead Channel Pacing Threshold Amplitude: 0.5 V
Lead Channel Pacing Threshold Amplitude: 0.875 V
Lead Channel Pacing Threshold Pulse Width: 0.4 ms
Lead Channel Pacing Threshold Pulse Width: 0.4 ms
Lead Channel Sensing Intrinsic Amplitude: 4.875 mV
Lead Channel Sensing Intrinsic Amplitude: 4.875 mV
Lead Channel Sensing Intrinsic Amplitude: 5.75 mV
Lead Channel Sensing Intrinsic Amplitude: 5.75 mV
Lead Channel Setting Pacing Amplitude: 2 V
Lead Channel Setting Pacing Amplitude: 2.5 V
Lead Channel Setting Pacing Pulse Width: 0.4 ms
Lead Channel Setting Sensing Sensitivity: 1.2 mV

## 2019-05-28 NOTE — Telephone Encounter (Signed)
Pt agrees to try the Gaviscon and will report back to our office if it has helped or not.

## 2019-05-29 ENCOUNTER — Ambulatory Visit: Payer: Medicare Other | Attending: Orthopaedic Surgery | Admitting: Physical Therapy

## 2019-05-29 ENCOUNTER — Encounter: Payer: Self-pay | Admitting: Physical Therapy

## 2019-05-29 ENCOUNTER — Other Ambulatory Visit: Payer: Self-pay | Admitting: Internal Medicine

## 2019-05-29 ENCOUNTER — Other Ambulatory Visit: Payer: Self-pay

## 2019-05-29 DIAGNOSIS — G8929 Other chronic pain: Secondary | ICD-10-CM

## 2019-05-29 DIAGNOSIS — M25512 Pain in left shoulder: Secondary | ICD-10-CM | POA: Insufficient documentation

## 2019-05-29 DIAGNOSIS — R262 Difficulty in walking, not elsewhere classified: Secondary | ICD-10-CM | POA: Insufficient documentation

## 2019-05-29 DIAGNOSIS — M546 Pain in thoracic spine: Secondary | ICD-10-CM | POA: Diagnosis present

## 2019-05-29 DIAGNOSIS — M6281 Muscle weakness (generalized): Secondary | ICD-10-CM | POA: Insufficient documentation

## 2019-05-29 DIAGNOSIS — M545 Low back pain, unspecified: Secondary | ICD-10-CM

## 2019-05-29 DIAGNOSIS — Z1231 Encounter for screening mammogram for malignant neoplasm of breast: Secondary | ICD-10-CM

## 2019-05-29 NOTE — Therapy (Signed)
[facility] [address] [place], [address] Phone: [phone]   Fax:  [phone]  Physical Therapy Evaluation  Patient Details  Name: Deborah Obrien MRN: [id number] Date of Birth: [birth year] Referring Provider (PT): Eugene Garnet, MD   Encounter Date: 05/29/2019  PT End of Session - 05/29/19 1514    Visit Number  1    Number of Visits  24    Date for PT Re-Evaluation  08/21/19    Authorization Type  Primary Medicare; secondary BCBS reporting from 05/29/2019    Authorization Time Period  Current certification: [id number] to 08/21/2019    Authorization - Visit Number  1    Authorization - Number of Visits  10    PT Start Time  1522    PT Stop Time  1616    PT Time Calculation (min)  54 min    Equipment Utilized During Treatment  Gait belt    Activity Tolerance  Patient limited by pain;Patient tolerated treatment well;Patient limited by fatigue    Behavior During Therapy  [facility] for tasks assessed/performed       Past Medical History:  Diagnosis Date  . A-fib ([place]) 2016  . Acid reflux   . Anxiety   . Bowel obstruction ([place])   . CHF (congestive heart failure) ([place]) 2017  . Chronic kidney disease (CKD) stage G3a/A1, moderately decreased glomerular filtration rate (GFR) between 45-59 mL/min/1.73 square meter and albuminuria creatinine ratio less than 30 mg/g (HCC)   . Diabetes mellitus without complication ([place])   . Dyspnea   . Fatty liver   . GI bleed   . Gout   . Hypertension   . Morbid obesity ([place])   . Osteoarthritis   . Osteoporosis   . Paroxysmal atrial fibrillation (HCC)   . Presence of permanent cardiac pacemaker   . Sinus node dysfunction ([place]) 08/24/2017  . TIA (transient ischemic attack)     Past Surgical History:  Procedure Laterality Date  . ABDOMINAL HYSTERECTOMY  1980  . APPENDECTOMY    . BACK SURGERY     2008  . BACK SURGERY  2019   patient describes kyphoplasty for  compression fractures, MD referral said fusion  . BREAST BIOPSY Right 08/16   stereo fibroadenomatous change, neg for atypia  . BREAST EXCISIONAL BIOPSY Left    neg  . CARDIAC CATHETERIZATION Left 08/25/2016   Procedure: Left Heart Cath and Coronary Angiography;  Surgeon: Corey Skains, MD;  Location: [place];  Service: Cardiovascular;  Laterality: Left;  . CARDIOVERSION N/A 06/28/2017   Procedure: CARDIOVERSION;  Surgeon: Corey Skains, MD;  Location: [facility];  Service: Cardiovascular;  Laterality: N/A;  . CARDIOVERSION N/A 02/06/2018   Procedure: CARDIOVERSION;  Surgeon: Josue Hector, MD;  Location: [facility];  Service: Cardiovascular;  Laterality: N/A;  . CATARACT EXTRACTION, BILATERAL  2012  . CHOLECYSTECTOMY    . colon blockage  1999  . COLON SURGERY  1999  . COLONOSCOPY  2016   polyps removed 2016  . DORSAL COMPARTMENT RELEASE Left 09/14/2016   Procedure: RELEASE DORSAL COMPARTMENT (DEQUERVAIN);  Surgeon: Dereck Leep, MD;  Location: [facility];  Service: Orthopedics;  Laterality: Left;  . ESOPHAGOGASTRODUODENOSCOPY N/A 01/27/2015   Procedure: ESOPHAGOGASTRODUODENOSCOPY (EGD);  Surgeon: Lollie Sails, MD;  Location: [facility] ENDOSCOPY;  Service: Endoscopy;  Laterality: N/A;  . ESOPHAGOGASTRODUODENOSCOPY (EGD) WITH PROPOFOL N/A 07/24/2015   Procedure: ESOPHAGOGASTRODUODENOSCOPY (EGD) WITH PROPOFOL;  Surgeon: Lucilla Lame,  MD;  Location: [facility] ENDOSCOPY;  Service: Endoscopy;  Laterality: N/A;  . ESOPHAGOGASTRODUODENOSCOPY (EGD) WITH PROPOFOL N/A 04/12/2018   Procedure: ESOPHAGOGASTRODUODENOSCOPY (EGD) WITH PROPOFOL;  Surgeon: Jonathon Bellows, MD;  Location: [facility] ENDOSCOPY;  Service: Gastroenterology;  Laterality: N/A;  . ESOPHAGOGASTRODUODENOSCOPY (EGD) WITH PROPOFOL N/A 06/22/2018   Procedure: ESOPHAGOGASTRODUODENOSCOPY (EGD) WITH PROPOFOL;  Surgeon: [name], MD;  Location: [facility] ENDOSCOPY;  Service: Endoscopy;  Laterality: N/A;  . ESOPHAGOGASTRODUODENOSCOPY (EGD)  WITH PROPOFOL N/A 07/09/2018   Procedure: ESOPHAGOGASTRODUODENOSCOPY (EGD) WITH PROPOFOL with resection of gastric polyps;  Surgeon: Jonathon Bellows, MD;  Location: [facility] ENDOSCOPY;  Service: Gastroenterology;  Laterality: N/A;  . ESOPHAGOGASTRODUODENOSCOPY (EGD) WITH PROPOFOL N/A 05/17/2019   Procedure: ESOPHAGOGASTRODUODENOSCOPY (EGD) WITH PROPOFOL;  Surgeon: Jonathon Bellows, MD;  Location: [facility] ENDOSCOPY;  Service: Gastroenterology;  Laterality: N/A;  . EUS N/A 06/22/2018   Procedure: UPPER ENDOSCOPIC ULTRASOUND (EUS) RADIAL;  Surgeon: [name], MD;  Location: [facility] ENDOSCOPY;  Service: Endoscopy;  Laterality: N/A;  . JOINT REPLACEMENT  2014   Bilateral Knee replacement  . LEAD REVISION/REPAIR N/A 08/29/2017   Procedure: LEAD REVISION/REPAIR;  Surgeon: Constance Haw, MD;  Location: [place] [facility];  Service: Cardiovascular;  Laterality: N/A;  . OOPHORECTOMY    . PACEMAKER INSERTION Left 07/01/2015   Procedure: INSERTION PACEMAKER;  Surgeon: Isaias Cowman, MD;  Location: [facility];  Service: Cardiovascular;  Laterality: Left;  . PACEMAKER REVISION N/A 08/28/2017   Procedure: PACEMAKER REVISION;  Surgeon: Deboraha Sprang, MD;  Location: [place] [facility];  Service: Cardiovascular;  Laterality: N/A;  . REPLACEMENT TOTAL KNEE BILATERAL    . SHOULDER ARTHROSCOPY WITH SUBACROMIAL DECOMPRESSION Left 2013  . TEE WITHOUT CARDIOVERSION N/A 06/28/2017   Procedure: TRANSESOPHAGEAL ECHOCARDIOGRAM (TEE);  Surgeon: Corey Skains, MD;  Location: [facility] OR[facility];  Service: Cardiovascular;  Laterality: N/A;    There were no vitals filed for this visit.      Subjective Assessment - 05/29/19 1950    Subjective  Patient reports the onset of pain since 2019 located at upper and lower back due to the accidental  dropped down to chair in nursing home last year. The pain is currently 6/10, and the worse pain can go up to 10/10 and lowest can be 3/10. Patient reports aggravating factors including:  increased mobility(sit <> stand, UE/LE AROM during ADLs, IADLs). Patient has been trying the following methods to relief the pain: heating pad, pain medication, and resting in recliner. However, none of the above methods had long lasting effect towards pain relief. Patient is currently retired. Patient enjoys play with grandchildren and doing housework at home. However, patient unable to fully participate the aforementioned activities and  reports having difficulties with ADLs/IADLs (such as: standing, walk to the kitchen, washing dishes, cooking, dressing, bathing), recreational activities ( play with children), due to the current condition.    Patient is accompained by:  Family member   [name]   Pertinent History  Patient is a 78 y.o. female who presents to outpatient physical therapy with a referral for secondary kyphosis of thoracic region. This patient's chief complaints consist of pain, inability to stand up straight, poor activity tolerance following recently rib fracture at 7th rib at R 04/02/2019. Patient also has a fall history that being dropped at rehab facility leading to 2 compression fractures that required kyphoplasty in late 2019. The above history leading to the following functional deficits: difficulty with ADLs, IADLs, bed mobility, transfers, household and community mobility, social and community participation.  Relevant  past medical history and comorbidities include SOB with exertion; shingles (2020), chronic kidney disease (between stage III-IV according to pt), diabetes (last a1c around 7), stomach polyp that was recently surgically repaired, obesity, Hx of B rotator cuff tears (with repairs), history of 2 strokes, pacemaker with revision(properly functioning per pt 05/29/2019), achy joints, bilateral knee replacement, gout, dypsnea, acid reflux, CHF, GI bleed, hypertension, afib w/history of cardioversion, osteoporosis, repeated hospitalization with decreased endurance and  functional mobility, wide range of allergies. Recent surgical procedures (in 2019): cardioversion, esophagogastroduendoscopy with propofol (x2), back surgery (pt describes kyphoplasty at 2 levels, and she mentioned prior fusion), eye surgery, diabetes, no seizures. See chart for extensive past medical history.    Limitations  Sitting;Lifting;Standing;Walking;House hold activities    How long can you sit comfortably?  30 min    How long can you stand comfortably?  1-1.5 minutes    How long can you walk comfortably?  less than 20 feet    Diagnostic tests  04/02/2019 CI chest IMPRESSION: Mildly displaced right seventh rib fracture. Scheduled bone density exam on 06/07/2019    Patient Stated Goals  Walking longer distance, able to play with her grandchildren as well a reduce of her back pain.    Currently in Pain?  Yes   Patient states taking pain medication prior PT session for pain control   Pain Score  6    10/10 pain as worst   Pain Location  Back    Pain Orientation  Upper;Lower;Mid    Pain Descriptors / Indicators  Constant;Aching;Crying;Spasm    Pain Type  Chronic pain    Pain Onset  More than a month ago    Pain Frequency  Constant    Aggravating Factors   prolonged walking, sitting, standing, any increased mobility    Pain Relieving Factors  heating pad, resting in recliner, pain medication    Effect of Pain on Daily Activities  Impedes daily activities        [facility] PT Assessment - 05/30/19 0001      Assessment   Medical Diagnosis   secondary kyphosis of thoracic region    Referring Provider (PT)  Eugene Garnet, MD    Onset Date/Surgical Date  --   2019 due to fall at nursing home   Hand Dominance  Right    Prior Therapy  PT      Precautions   Precautions  ICD/Pacemaker    Precaution Comments  Pacemaker working properly per patient report     Required Braces or Orthoses  --   Patient's own spinal  brace but not required for therapy     Restrictions   Weight Bearing  Restrictions  No    Other Position/Activity Restrictions  --   "no heavy lifting" due to live fx     Balance Screen   Has the patient fallen in the past 6 months  No    Has the patient had a decrease in activity level because of a fear of falling?   Yes    Is the patient reluctant to leave their home because of a fear of falling?   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 Unsettled sitting in w/c and requires constant postural adjustments.    SPINE MOTION *patient instructed to perform pain free AROM due to painful at all neck motion at ER Cervical AROM:  Flexion: 32 degree. Extension: 15 degrees. L side flexion: 23 degrees R side flexion:  24 degrees  Rotation R: 37 degrees.  Rotation L: 31 degrees  Thoracic flexion/extension/rotation R/L: Deferred Lumbar flexion/extension: Deferred   Shoulder flexion/extension: limited ROM up to shoulder level. Worse on the L shoulder (pulled her L shoulder when she put on her bra per pt)  ACCESSORY MOTION:  Deferred due to decrease prone lying tolerance by  patient  FUNCTIONAL MOBILITY: Bed mobility:  Deferred due to intolerance of activity and patient sleep on recliner with extra pillow on her back for support as her baseline.  Transfers:   CGA transfer for sitting <> standing, painful  Chair to chair ambulation transfer with SPC, decreased gait speed and narrow base of support Gait:   +1 Min A for hand held ambulated 46feet with significant increase of back pain. Chair followed for safety.   SPECIAL TESTS: TUG 74feet:   63 seconds with SPC +1 minA for hand held and +1 w/c follow. Able to complete 20 feet but unable to complete the last 180 turn. Requires sitting due to significant increase of back pain.   5xSTS:  37.5 seconds. Requires BUE support on w/c. Significant increase of back pain.    Objective measurements completed on examination: See above findings.    TREATMENT:  Pacemaker  Therapeutic exercise: to centralize symptoms and improve ROM, strength, muscular endurance, and activity tolerance required for successful completion of functional activities.  - Cervical stretching in side flexion R/L; rotation R/L. 1 reps each with 5 seconds hold. Curing provided for postures and techniques.  - Ambulation 20 feet with cane and MinA +HHA with cuing to improve safety awareness and knowledge of assistive devices to reduce risk of fall.  - Education on diagnosis, prognosis, POC, anatomy and physiology of current condition.  - Education on HEP including handout   HOME EXERCISE PROGRAM Access Code: [email or web address]  URL: [email or web address]  Date: 05/29/2019  Prepared by: Rosita Kea   Exercises Neck Sidebending - 5 reps - 30 seconds hold - 2x daily - 7x weekly Standing Cervical Rotation AROM - 5 reps - 30 seconds hold - 2x daily - 7x weekly  Patient response to treatment:  Pt tolerated the session well but limited participation due to back pain. Pt was able to complete all exercises with significant increase in pain  or discomfort. Extended time spent on comforting, verbally supporting when patient has emotional expression (crying) due to severe pain. Patient able to calm down eventually with help from family and therapy team and stated she is looking forwarding working with therapy and get stronger. Pt required  multimodal cuing for proper technique and to facilitate improved neuromuscular control, strength, range of motion, and functional ability resulting in improved performance and form.     PT Education - 05/29/19 1513    Education Details  Exercise purpose/form. Self management techniques. Education on diagnosis, prognosis, POC, anatomy and physiology of current condition Education on HEP including handout    Person(s) Educated  Patient    Methods  Explanation;Demonstration;Tactile cues;Verbal cues    Comprehension  Verbalized understanding;Returned demonstration;Verbal cues required;Tactile cues required       PT Short Term Goals - 05/29/19 1531      PT SHORT TERM GOAL #1   Title  Be independent with intial home exercise program completed at least 3 times per week for self-management of symptoms.    Baseline  initial HEP provided at IE (09/16);    Time  4    Period  Weeks    Status  New    Target Date  06/26/19        PT Long Term Goals - 05/30/19 [id number]      PT LONG TERM GOAL #1   Title  Be independent with a long-term home exercise program for self-management of symptoms.    Baseline  initial HEP provided at IE (05/29/2019);    Time  12    Period  Weeks    Status  New    Target Date  08/21/19      PT LONG TERM GOAL #2   Title  Patient will be able to ambulate > 200 feet mod I with LRAD for improved community mobility    Baseline  20 feet (05/29/2019);    Time  8    Period  Weeks    Status  New    Target Date  07/24/19      PT LONG TERM GOAL #3   Title  Reduce pain with functional activities to equal or less than 3/10 to allow patient to be more independent with usual activities  including ADLs, IADLs, and social engagement with less difficulty.     Baseline  10/10 (05/29/2019);    Time  8    Period  Weeks    Status  New    Target Date  07/24/19      PT LONG TERM GOAL #4   Title  Be able to complete 5 sit to stand test in 20 seconds to improve functional mobility for transfers and community participation.    Baseline  37.5s for 5 STS (05/29/2019);    Time  8    Period  Weeks    Status  New    Target Date  07/24/19      PT LONG TERM GOAL #5   Title  Patient will be able to complete 10 feet TUG within 40s to improve fuctional mobility for transfers and community participation.    Baseline  63s (05/29/2019);    Time  12    Period  Weeks    Status  New    Target Date  08/21/19      Additional Long Term Goals   Additional Long Term Goals  Yes      PT LONG TERM GOAL #6   Title  Demonstrate improved FOTO score by 10 units to demonstrate improvement in overall condition and self-reported functional ability.    Baseline  FOTO 30; MCD 10 (05/29/2019);    Time  12    Period  Weeks    Status  New  Target Date  08/21/19             Plan - 05/30/19 0940    Clinical Impression Statement  Patient is a 78 y.o. female who presents to outpatient physical therapy with a referral for medical diagnosis of secondary kyphosis of thoracic region. Patient presents the signs and symptoms consistent with thoracic and lower back pain, general deconditioning, hyperkyphosis and difficulty walking. Upon assessment, patient demonstrated forward neck/thoracic postures, as well as deficits in active ROM (and pain at patient's available end range), mobility(transfers/walking), decreased activity tolerance, significant muscle weakness, pain and fatigue. These deficits limit the patient ability to perform things such as ADLs, IADLs, social participation, caring for and playing with grandchildren, engaging in hobbies (housework), and impairs her quality of life. The pt will benefit  from skilled PT services to address deficits and return to PLOF function, independence at home, recreational activity and housework.    Personal Factors and Comorbidities  Age;Time since onset of injury/illness/exacerbation;Comorbidity 3+;Fitness    Comorbidities  SOB with exertion; shingles (2020), chronic kidney disease (between stage III-IV according to pt), diabetes (last a1c around 7), stomach polyp that was recently surgically repaired, obesity, Hx of B rotator cuff tears (with repairs), history of 2 strokes, pacemaker with revision(properly functioning per pt 05/29/2019), achy joints, bilateral knee replacement, gout, dypsnea, acid reflux, CHF, GI bleed, hypertension, afib w/history of cardioversion, osteoporosis, repeated hospitalization with decreased endurance and functional mobility, wide range of allergies. Recent surgical procedures (in 2019): cardioversion, esophagogastroduendoscopy with propofol (x2), back surgery (pt describes kyphoplasty at 2 levels, and she mentioned prior fusion), eye surgery, diabetes, no seizures. See chart for extensive past medical history.    Examination-Activity Limitations  Dressing;Sleep;Sit;Carry;Bathing;Squat;Bed Mobility;Hygiene/Grooming;Lift;Stairs;Bend;Stand;Caring for Others;Reach Overhead;Transfers;Locomotion Level;Toileting;Continence    Examination-Participation Restrictions  Cleaning;Shop;Laundry;Community Activity;Meal Prep;Yard Work;Driving;Interpersonal Relationship    Stability/Clinical Decision Making  Evolving/Moderate complexity    Clinical Decision Making  Moderate    Rehab Potential  Fair    PT Frequency  2x / week    PT Duration  12 weeks    PT Treatment/Interventions  Cryotherapy;ADLs/Self Care Home Management;Moist Heat;Gait training;Stair training;Functional mobility training;Therapeutic activities;Therapeutic exercise;Balance training;Neuromuscular re-education;Wheelchair mobility training;Patient/family education;Passive range of  motion;Manual techniques;Dry needling;Energy conservation;Aquatic Therapy;Other (comment)   Joint mobilizations grades I-IV   PT Next Visit Plan  UE/LE strengthening, ROM, trunk extensor strengthening    PT Home Exercise Plan  Medbridge: [id number] and Agree with Plan of Care  Patient       Patient will benefit from skilled therapeutic intervention in order to improve the following deficits and impairments:  Abnormal gait, Decreased mobility, Decreased coordination, Increased muscle spasms, Postural dysfunction, Pain, Improper body mechanics, Decreased activity tolerance, Decreased endurance, Decreased range of motion, Decreased strength, Hypomobility, Impaired perceived functional ability, Impaired UE functional use, Decreased balance, Difficulty walking, Decreased safety awareness, Impaired flexibility, Obesity  Visit Diagnosis: Chronic midline low back pain without sciatica  Pain in thoracic spine  Difficulty in walking, not elsewhere classified  Muscle weakness (generalized)  Acute pain of left shoulder     Problem List Patient Active Problem List   Diagnosis Date Noted  . RUQ pain   . UTI (urinary tract infection) 06/14/2018  . Type 2 diabetes mellitus with neurological complications ([place]) 99991111  . Acute respiratory failure ([place]) 04/09/2018  . Chronic anticoagulation 04/09/2018  . Pacemaker 04/09/2018  . H/O TIA (transient ischemic attack) and stroke 04/09/2018  . CAD (coronary artery disease) 04/09/2018  . Hematemesis 04/05/2018  . Hypotension  02/04/2018  . Atrial fibrillation with RVR ([place]) 02/04/2018  . Atrial flutter ([place]) 08/24/2017  . Sinus node dysfunction ([place]) 08/24/2017  . Dizziness 06/12/2017  . Mastalgia 02/14/2017  . Acute on chronic renal insufficiency 01/09/2017  . Fatty liver 01/09/2017  . Benign essential HTN 09/08/2016  . Nephrolithiasis 02/22/2016  . Diabetic neuropathy with neurologic complication ([place]) [id number]  . Moderate  episode of recurrent major depressive disorder ([place]) 08/11/2015  . Gastritis without bleeding   . Gastric polyp   . Gastro-esophageal reflux disease without esophagitis   . PAF (paroxysmal atrial fibrillation) ([place]) 06/29/2015  . Intestinal obstruction ([place]) 02/20/2015  . DDD (degenerative disc disease), lumbar 01/19/2015  . Back pain 01/12/2015  . Gout 01/12/2015  . Complete tear of left rotator cuff 10/16/2014  . Intervertebral disc disorder with radiculopathy of lumbar region 09/17/2014  . Lumbar radiculitis 09/17/2014  . Lumbar stenosis with neurogenic claudication 09/17/2014  . Neuritis or radiculitis due to rupture of lumbar intervertebral disc 09/17/2014  . Atherosclerosis of both carotid arteries 07/15/2014  . Essential hypertension 07/15/2014  . Hyperlipemia, mixed 07/15/2014  . 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 Body mass index (BMI) of 40.0-44.9 in adult ([place]) 06/21/2014  . Mixed incontinence 06/25/2013  . Mixed urge and stress incontinence 06/25/2013  . Nocturia 06/25/2013  . Blurring of visual image 04/11/2012    Sherrilyn Rist, SPT 05/30/19, 2:58 PM  Everlean Alstrom. Graylon Good, PT, DPT 05/30/19, 3:02 PM  [facility] [address], [place], [address] Phone: [phone]   Fax:  [phone]  Name: LOETTE FAUERBACH MRN: [id number] Date of Birth: July 01, 1941

## 2019-06-03 ENCOUNTER — Ambulatory Visit: Payer: Medicare Other | Admitting: Physical Therapy

## 2019-06-03 ENCOUNTER — Encounter: Payer: Self-pay | Admitting: Physical Therapy

## 2019-06-03 ENCOUNTER — Other Ambulatory Visit: Payer: Self-pay

## 2019-06-03 DIAGNOSIS — M545 Low back pain, unspecified: Secondary | ICD-10-CM

## 2019-06-03 DIAGNOSIS — M6281 Muscle weakness (generalized): Secondary | ICD-10-CM

## 2019-06-03 DIAGNOSIS — M25512 Pain in left shoulder: Secondary | ICD-10-CM

## 2019-06-03 DIAGNOSIS — M546 Pain in thoracic spine: Secondary | ICD-10-CM

## 2019-06-03 DIAGNOSIS — R262 Difficulty in walking, not elsewhere classified: Secondary | ICD-10-CM

## 2019-06-03 DIAGNOSIS — G8929 Other chronic pain: Secondary | ICD-10-CM

## 2019-06-03 NOTE — Therapy (Signed)
[facility] [address] [place], [address] Phone: [phone]   Fax:  [phone]  Physical Therapy Treatment  Patient Details  Name: Deborah Obrien MRN: [id number] Date of Birth: [id number] Referring Provider (PT): Eugene Garnet, MD   Encounter Date: 06/03/2019  PT End of Session - 06/03/19 1721    Visit Number  2    Number of Visits  24    Date for PT Re-Evaluation  08/21/19    Authorization Type  Primary Medicare; secondary BCBS reporting from 05/29/2019    Authorization Time Period  Current certification: [id number] to 08/21/2019    Authorization - Visit Number  2    Authorization - Number of Visits  10    PT Start Time  [id number]    PT Stop Time  [id number]    PT Time Calculation (min)  40 min    Equipment Utilized During Treatment  Gait belt    Activity Tolerance  Patient limited by pain;Patient tolerated treatment well;Patient limited by fatigue    Behavior During Therapy  [facility] for tasks assessed/performed       Past Medical History:  Diagnosis Date  . A-fib ([place]) 2016  . Acid reflux   . Anxiety   . Bowel obstruction ([place])   . CHF (congestive heart failure) ([place]) 2017  . Chronic kidney disease (CKD) stage G3a/A1, moderately decreased glomerular filtration rate (GFR) between 45-59 mL/min/1.73 square meter and albuminuria creatinine ratio less than 30 mg/g (HCC)   . Diabetes mellitus without complication ([place])   . Dyspnea   . Fatty liver   . GI bleed   . Gout   . Hypertension   . Morbid obesity ([place])   . Osteoarthritis   . Osteoporosis   . Paroxysmal atrial fibrillation (HCC)   . Presence of permanent cardiac pacemaker   . Sinus node dysfunction ([place]) 08/24/2017  . TIA (transient ischemic attack)     Past Surgical History:  Procedure Laterality Date  . ABDOMINAL HYSTERECTOMY  1980  . APPENDECTOMY    . BACK SURGERY     2008  . BACK SURGERY  2019   patient describes kyphoplasty for  compression fractures, MD referral said fusion  . BREAST BIOPSY Right 08/16   stereo fibroadenomatous change, neg for atypia  . BREAST EXCISIONAL BIOPSY Left    neg  . CARDIAC CATHETERIZATION Left 08/25/2016   Procedure: Left Heart Cath and Coronary Angiography;  Surgeon: Corey Skains, MD;  Location: [place] [facility];  Service: Cardiovascular;  Laterality: Left;  . CARDIOVERSION N/A 06/28/2017   Procedure: CARDIOVERSION;  Surgeon: Corey Skains, MD;  Location: [facility];  Service: Cardiovascular;  Laterality: N/A;  . CARDIOVERSION N/A 02/06/2018   Procedure: CARDIOVERSION;  Surgeon: Josue Hector, MD;  Location: [facility] [place];  Service: Cardiovascular;  Laterality: N/A;  . CATARACT EXTRACTION, BILATERAL  2012  . CHOLECYSTECTOMY    . colon blockage  1999  . COLON SURGERY  1999  . COLONOSCOPY  2016   polyps removed 2016  . DORSAL COMPARTMENT RELEASE Left 09/14/2016   Procedure: RELEASE DORSAL COMPARTMENT (DEQUERVAIN);  Surgeon: Dereck Leep, MD;  Location: [facility];  Service: Orthopedics;  Laterality: Left;  . ESOPHAGOGASTRODUODENOSCOPY N/A 01/27/2015   Procedure: ESOPHAGOGASTRODUODENOSCOPY (EGD);  Surgeon: Lollie Sails, MD;  Location: [facility] END[facility];  Service: Endoscopy;  Laterality: N/A;  . ESOPHAGOGASTRODUODENOSCOPY (EGD) WITH PROPOFOL N/A 07/24/2015   Procedure: ESOPHAGOGASTRODUODENOSCOPY (EGD) WITH PROPOFOL;  Surgeon: Lucilla Lame,  MD;  Location: [facility] ENDOSCOPY;  Service: Endoscopy;  Laterality: N/A;  . ESOPHAGOGASTRODUODENOSCOPY (EGD) WITH PROPOFOL N/A 04/12/2018   Procedure: ESOPHAGOGASTRODUODENOSCOPY (EGD) WITH PROPOFOL;  Surgeon: Jonathon Bellows, MD;  Location: [facility] ENDOSCOPY;  Service: Gastroenterology;  Laterality: N/A;  . ESOPHAGOGASTRODUODENOSCOPY (EGD) WITH PROPOFOL N/A 06/22/2018   Procedure: ESOPHAGOGASTRODUODENOSCOPY (EGD) WITH PROPOFOL;  Surgeon: [name], MD;  Location: [facility] ENDOSCOPY;  Service: Endoscopy;  Laterality: N/A;  . ESOPHAGOGASTRODUODENOSCOPY (EGD)  WITH PROPOFOL N/A 07/09/2018   Procedure: ESOPHAGOGASTRODUODENOSCOPY (EGD) WITH PROPOFOL with resection of gastric polyps;  Surgeon: Jonathon Bellows, MD;  Location: [facility] ENDOSCOPY;  Service: Gastroenterology;  Laterality: N/A;  . ESOPHAGOGASTRODUODENOSCOPY (EGD) WITH PROPOFOL N/A 05/17/2019   Procedure: ESOPHAGOGASTRODUODENOSCOPY (EGD) WITH PROPOFOL;  Surgeon: Jonathon Bellows, MD;  Location: [facility] END[facility]COPY;  Service: Gastroenterology;  Laterality: N/A;  . EUS N/A 06/22/2018   Procedure: UPPER ENDOSCOPIC ULTRASOUND (EUS) RADIAL;  Surgeon: [name], MD;  Location: [facility] ENDOSCOPY;  Service: Endoscopy;  Laterality: N/A;  . JOINT REPLACEMENT  2014   Bilateral Knee replacement  . LEAD REVISION/REPAIR N/A 08/29/2017   Procedure: LEAD REVISION/REPAIR;  Surgeon: Constance Haw, MD;  Location: [place] [facility];  Service: Cardiovascular;  Laterality: N/A;  . OOPHORECTOMY    . PACEMAKER INSERTION Left 07/01/2015   Procedure: INSERTION PACEMAKER;  Surgeon: Isaias Cowman, MD;  Location: [facility];  Service: Cardiovascular;  Laterality: Left;  . PACEMAKER REVISION N/A 08/28/2017   Procedure: PACEMAKER REVISION;  Surgeon: Deboraha Sprang, MD;  Location: [facility] [place] [facility];  Service: Cardiovascular;  Laterality: N/A;  . REPLACEMENT TOTAL KNEE BILATERAL    . SHOULDER ARTHROSCOPY WITH SUBACROMIAL DECOMPRESSION Left 2013  . TEE WITHOUT CARDIOVERSION N/A 06/28/2017   Procedure: TRANSESOPHAGEAL ECHOCARDIOGRAM (TEE);  Surgeon: Corey Skains, MD;  Location: [facility];  Service: Cardiovascular;  Laterality: N/A;    There were no vitals filed for this visit.  Subjective Assessment - 06/03/19 1719    Subjective  Patient reports feeling lots of pain at her back and hip today. Pain rated  as 6/10 and pain increaes with increased mobility.    Patient is accompained by:  Family member   husband Deborah Obrien   Pertinent History  Patient is a 78 y.o. female who presents to outpatient physical therapy with a referral  for secondary kyphosis of thoracic region. This patient's chief complaints consist of pain, inability to stand up straight, poor activity tolerance following recently rib fracture at 7th rib at R 04/02/2019. Patient also has a fall history that being dropped at rehab facility leading to 2 compression fractures that required kyphoplasty in late 2019. The above history leading to the following functional deficits: difficulty with ADLs, IADLs, bed mobility, transfers, household and community mobility, social and community participation.  Relevant past medical history and comorbidities include SOB with exertion; shingles (2020), chronic kidney disease (between stage III-IV according to pt), diabetes (last a1c around 7), stomach polyp that was recently surgically repaired, obesity, Hx of B rotator cuff tears (with repairs), history of 2 strokes, pacemaker with revision(properly functioning per pt 05/29/2019), achy joints, bilateral knee replacement, gout, dypsnea, acid reflux, CHF, GI bleed, hypertension, afib w/history of cardioversion, osteoporosis, repeated hospitalization with decreased endurance and functional mobility, wide range of allergies. Recent surgical procedures (in 2019): cardioversion, esophagogastroduendoscopy with propofol (x2), back surgery (pt describes kyphoplasty at 2 levels, and she mentioned prior fusion), eye surgery, diabetes, no seizures. See chart for extensive past medical history.    Limitations  Sitting;Lifting;Standing;Walking;House hold activities  How long can you sit comfortably?  30 min    How long can you stand comfortably?  1-1.5 minutes    How long can you walk comfortably?  less than 20 feet    Diagnostic tests  04/02/2019 CI chest IMPRESSION: Mildly displaced right seventh rib fracture. Scheduled bone density exam on 06/07/2019    Patient Stated Goals  Walking longer distance, able to play with her grandchildren as well a reduce of her back pain.    Currently in Pain?   Yes    Pain Score  6     Pain Location  Back    Pain Orientation  Upper;Lower;Mid    Pain Onset  More than a month ago        TREATMENT:  Pacemaker  Therapeutic exercise: to centralize symptoms and improve ROM, strength, muscular endurance, and activity tolerance required for successful completion of functional activities.  - NuStep level 1 with using bilateral upper and lower extremeities. Setting 9. For improved extremity mobility, muscular endurance, and activity tolerance; and to induce the analgesic effect of aerobic exercise, stimulate improved joint nutrition, and prepare body structures and systems for following interventions. 8 minutes. 24 spm, 0.10  mile. Cuing to improve SPM up to and soft end control with stepping.  - seated shoulder flexion BUE with cane. 3x10 reps. To improve shoulder ROM, as well as strengthening at upper and lower back. Minimal to no back support.  - seated shoulder row with yellow theraband. 3x10 reps. Cuing to sit forward for BUE positioning and to improve trunk control. no back support.  - seated thoracic extension over back of chair with BUE cross over chest 2X 10 with towel added at lower back to reduce discomfort at lower back.   - seated scapular depression pressing elbows into chair arms 2x10 with no back support, to improve trunk strength. - Seated resisted neck side flexions R/L. 3x5 reps. 3 second hold at end range. Last set with yellow theraband. Cuing provided to improve postural, muscle activation as well as   Education on HEP including updated handout    Manual therapy: to reduce pain and tissue tension, improve range of motion, neuromodulation, in order to promote improved ability to complete functional activities. - STM at [facility] at bilateral side with neck side flexions as well as bilateral upper trap (8 minutes in total) To improve ROM at neck side flexions. Patient showing improvements within session for 20% ROM bilaterally. Patient tolerated  well without increase of pain but a little bit wooziness. Able to recover after 1 minute break.   HOME EXERCISE PROGRAM Access Code: [email or web address]  URL: [email or web address]  Date: 06/03/2019  Prepared by: Rosita Kea   Exercises  Neck Sidebending - 5 reps - 30 seconds hold - 2x daily - 7x weekly  Standing Cervical Rotation AROM - 5 reps - 30 seconds hold - 2x daily - 7x weekly  Seated Shoulder Flexion - 3 sets - 10 reps - 2x daily - 7x weekly  Seated Thoracic Lumbar Extension - 3 sets - 10 reps - 2x daily - 7x weekly  Seated Shoulder Row with Anchored Resistance - 3 sets - 10 reps - 2x daily - 7x weekly   Patient tolerated the session well today and states moderate increase of back pain at the end of the session. Patient demonstrated good understanding with exercises forms but requires extensive cuing to improve muscle activation and proper postural. Patient able to correctly perform core stabilization but has difficulties to maintain  with motions. Demonstrates poor trunk endurance, strength, and ROM. Will progress muscle strengthening/endurance, ROM in later session. The pt will benefit from skilled PT services to address deficits and return to PLOF function, independence at home, recreational activity and housework.     PT Education - 06/03/19 1721    Education Details  Exercise purpose/form. Educated and handout provided.    Person(s) Educated  Patient    Methods  Explanation;Demonstration;Tactile cues;Verbal cues    Comprehension  Verbalized understanding;Returned demonstration;Verbal cues required;Tactile cues required       PT Short Term Goals - 05/29/19 1531      PT SHORT TERM GOAL #1   Title  Be independent with intial home exercise program completed at least 3 times per week for self-management of symptoms.    Baseline  initial HEP provided at IE (09/16);    Time  4    Period  Weeks    Status  New    Target Date  06/26/19        PT Long Term Goals -  05/30/19 [id number]      PT LONG TERM GOAL #1   Title  Be independent with a long-term home exercise program for self-management of symptoms.    Baseline  initial HEP provided at IE (05/29/2019);    Time  12    Period  Weeks    Status  New    Target Date  08/21/19      PT LONG TERM GOAL #2   Title  Patient will be able to ambulate > 200 feet mod I with LRAD for improved community mobility    Baseline  20 feet (05/29/2019);    Time  8    Period  Weeks    Status  New    Target Date  07/24/19      PT LONG TERM GOAL #3   Title  Reduce pain with functional activities to equal or less than 3/10 to allow patient to be more independent with usual activities including ADLs, IADLs, and social engagement with less difficulty.     Baseline  10/10 (05/29/2019);    Time  8    Period  Weeks    Status  New    Target Date  07/24/19      PT LONG TERM GOAL #4   Title  Be able to complete 5 sit to stand test in 20 seconds to improve functional mobility for transfers and community participation.    Baseline  37.5s for 5 STS (05/29/2019);    Time  8    Period  Weeks    Status  New    Target Date  07/24/19      PT LONG TERM GOAL #5   Title  Patient will be able to complete 10 feet TUG within 40s to improve fuctional mobility for transfers and community participation.    Baseline  63s (05/29/2019);    Time  12    Period  Weeks    Status  New    Target Date  08/21/19      Additional Long Term Goals   Additional Long Term Goals  Yes      PT LONG TERM GOAL #6   Title  Demonstrate improved FOTO score by 10 units to demonstrate improvement in overall condition and self-reported functional ability.    Baseline  FOTO 30; MCD 10 (05/29/2019);    Time  12    Period  Weeks    Status  New  Target Date  08/21/19            Plan - 06/03/19 1722    Clinical Impression Statement  Patient tolerated the session well today and states moderate increase of back pain at the end of the session. Patient  demonstrated good understanding with exercises forms but requires extensive cuing to improve muscle activation and proper postural. Patient able to correctly perform core stabilization but has difficulties to maintain with motions. Demonstrates poor trunk endurance, strength, and ROM. Will progress muscle strengthening/endurance, ROM in later session. The pt will benefit from skilled PT services to address deficits and return to PLOF function, independence at home, recreational activity and housework.    Personal Factors and Comorbidities  Age;Time since onset of injury/illness/exacerbation;Comorbidity 3+;Fitness    Comorbidities  SOB with exertion; shingles (2020), chronic kidney disease (between stage III-IV according to pt), diabetes (last a1c around 7), stomach polyp that was recently surgically repaired, obesity, Hx of B rotator cuff tears (with repairs), history of 2 strokes, pacemaker with revision(properly functioning per pt 05/29/2019), achy joints, bilateral knee replacement, gout, dypsnea, acid reflux, CHF, GI bleed, hypertension, afib w/history of cardioversion, osteoporosis, repeated hospitalization with decreased endurance and functional mobility, wide range of allergies. Recent surgical procedures (in 2019): cardioversion, esophagogastroduendoscopy with propofol (x2), back surgery (pt describes kyphoplasty at 2 levels, and she mentioned prior fusion), eye surgery, diabetes, no seizures. See chart for extensive past medical history.    Examination-Activity Limitations  Dressing;Sleep;Sit;Carry;Bathing;Squat;Bed Mobility;Hygiene/Grooming;Lift;Stairs;Bend;Stand;Caring for Others;Reach Overhead;Transfers;Locomotion Level;Toileting;Continence    Examination-Participation Restrictions  Cleaning;Shop;Laundry;Community Activity;Meal Prep;Yard Work;Driving;Interpersonal Relationship    Stability/Clinical Decision Making  Evolving/Moderate complexity    Rehab Potential  Fair    PT Frequency  2x / week     PT Duration  12 weeks    PT Treatment/Interventions  Cryotherapy;ADLs/Self Care Home Management;Moist Heat;Gait training;Stair training;Functional mobility training;Therapeutic activities;Therapeutic exercise;Balance training;Neuromuscular re-education;Wheelchair mobility training;Patient/family education;Passive range of motion;Manual techniques;Dry needling;Energy conservation;Aquatic Therapy;Other (comment)   Joint mobilizations grades I-IV   PT Next Visit Plan  UE/LE strengthening, ROM, trunk extensor strengthening    PT Home Exercise Plan  Medbridge: [id number] and Agree with Plan of Care  Patient       Patient will benefit from skilled therapeutic intervention in order to improve the following deficits and impairments:  Abnormal gait, Decreased mobility, Decreased coordination, Increased muscle spasms, Postural dysfunction, Pain, Improper body mechanics, Decreased activity tolerance, Decreased endurance, Decreased range of motion, Decreased strength, Hypomobility, Impaired perceived functional ability, Impaired UE functional use, Decreased balance, Difficulty walking, Decreased safety awareness, Impaired flexibility, Obesity  Visit Diagnosis: Chronic midline low back pain without sciatica  Pain in thoracic spine  Difficulty in walking, not elsewhere classified  Muscle weakness (generalized)  Acute pain of left shoulder     Problem List Patient Active Problem List   Diagnosis Date Noted  . RUQ pain   . UTI (urinary tract infection) 06/14/2018  . Type 2 diabetes mellitus with neurological complications ([place]) 99991111  . Acute respiratory failure ([place]) 04/09/2018  . Chronic anticoagulation 04/09/2018  . Pacemaker 04/09/2018  . H/O TIA (transient ischemic attack) and stroke 04/09/2018  . CAD (coronary artery disease) 04/09/2018  . Hematemesis 04/05/2018  . Hypotension 02/04/2018  . Atrial fibrillation with RVR ([place]) 02/04/2018  . Atrial flutter ([place])  08/24/2017  . Sinus node dysfunction ([place]) 08/24/2017  . Dizziness 06/12/2017  . Mastalgia 02/14/2017  . Acute on chronic renal insufficiency 01/09/2017  . Fatty liver 01/09/2017  . Benign essential  HTN 09/08/2016  . Nephrolithiasis 02/22/2016  . Diabetic neuropathy with neurologic complication ([place]) [id number]  . Moderate episode of recurrent major depressive disorder ([place]) 08/11/2015  . Gastritis without bleeding   . Gastric polyp   . Gastro-esophageal reflux disease without esophagitis   . PAF (paroxysmal atrial fibrillation) ([place]) 06/29/2015  . Intestinal obstruction ([place]) 02/20/2015  . DDD (degenerative disc disease), lumbar 01/19/2015  . Back pain 01/12/2015  . Gout 01/12/2015  . Complete tear of left rotator cuff 10/16/2014  . Intervertebral disc disorder with radiculopathy of lumbar region 09/17/2014  . Lumbar radiculitis 09/17/2014  . Lumbar stenosis with neurogenic claudication 09/17/2014  . Neuritis or radiculitis due to rupture of lumbar intervertebral disc 09/17/2014  . Atherosclerosis of both carotid arteries 07/15/2014  . Essential hypertension 07/15/2014  . Hyperlipemia, mixed 07/15/2014  . 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 Body mass index (BMI) of 40.0-44.9 in adult ([place]) 06/21/2014  . Mixed incontinence 06/25/2013  . Mixed urge and stress incontinence 06/25/2013  . Nocturia 06/25/2013  . Blurring of visual image 04/11/2012   Sherrilyn Rist, SPT  Nancy Nordmann, SPT 06/03/2019, 6:07 PM   [facility] [address], [place], [address] Phone: [phone]   Fax:  [phone]  Name: Deborah Obrien MRN: [id number] Date of Birth: 04/09/1941

## 2019-06-04 NOTE — Progress Notes (Signed)
Remote pacemaker transmission.   

## 2019-06-05 ENCOUNTER — Encounter: Payer: Self-pay | Admitting: Physical Therapy

## 2019-06-05 ENCOUNTER — Ambulatory Visit: Payer: Medicare Other | Admitting: Physical Therapy

## 2019-06-05 ENCOUNTER — Other Ambulatory Visit: Payer: Self-pay

## 2019-06-05 DIAGNOSIS — M545 Low back pain: Secondary | ICD-10-CM | POA: Diagnosis not present

## 2019-06-05 DIAGNOSIS — R262 Difficulty in walking, not elsewhere classified: Secondary | ICD-10-CM

## 2019-06-05 DIAGNOSIS — M546 Pain in thoracic spine: Secondary | ICD-10-CM

## 2019-06-05 DIAGNOSIS — M25512 Pain in left shoulder: Secondary | ICD-10-CM

## 2019-06-05 DIAGNOSIS — M6281 Muscle weakness (generalized): Secondary | ICD-10-CM

## 2019-06-05 DIAGNOSIS — G8929 Other chronic pain: Secondary | ICD-10-CM

## 2019-06-05 NOTE — Therapy (Signed)
[facility] [address], [place], [address] Phone: [phone]   Fax:  [phone]  Physical Therapy Treatment  Patient Details  Name: Deborah Obrien MRN: [id number] Date of Birth: December 05, 1940 Referring Provider (PT): Eugene Garnet, MD   Encounter Date: 06/05/2019  PT End of Session - 06/05/19 1641    Visit Number  3    Number of Visits  24    Date for PT Re-Evaluation  08/21/19    Authorization Type  Primary Medicare; secondary BCBS reporting from 05/29/2019    Authorization Time Period  Current certification: [id number] to 08/21/2019    Authorization - Visit Number  3    Authorization - Number of Visits  10    PT Start Time  [id number]    PT Stop Time  1346    PT Time Calculation (min)  43 min    Equipment Utilized During Treatment  Gait belt    Activity Tolerance  Patient limited by pain;Patient tolerated treatment well;Patient limited by fatigue    Behavior During Therapy  [facility] for tasks assessed/performed       Past Medical History:  Diagnosis Date  . A-fib ([place]) 2016  . Acid reflux   . Anxiety   . Bowel obstruction ([place])   . CHF (congestive heart failure) ([place]) 2017  . Chronic kidney disease (CKD) stage G3a/A1, moderately decreased glomerular filtration rate (GFR) between 45-59 mL/min/1.73 square meter and albuminuria creatinine ratio less than 30 mg/g (HCC)   . Diabetes mellitus without complication ([place])   . Dyspnea   . Fatty liver   . GI bleed   . Gout   . Hypertension   . Morbid obesity ([place])   . Osteoarthritis   . Osteoporosis   . Paroxysmal atrial fibrillation (HCC)   . Presence of permanent cardiac pacemaker   . Sinus node dysfunction ([place]) 08/24/2017  . TIA (transient ischemic attack)     Past Surgical History:  Procedure Laterality Date  . ABDOMINAL HYSTERECTOMY  1980  . APPENDECTOMY    . BACK SURGERY     2008  . BACK SURGERY  2019   patient describes kyphoplasty for  compression fractures, MD referral said fusion  . BREAST BIOPSY Right 08/16   stereo fibroadenomatous change, neg for atypia  . BREAST EXCISIONAL BIOPSY Left    neg  . CARDIAC CATHETERIZATION Left 08/25/2016   Procedure: Left Heart Cath and Coronary Angiography;  Surgeon: Corey Skains, MD;  Location: [facility];  Service: Cardiovascular;  Laterality: Left;  . CARDIOVERSION N/A 06/28/2017   Procedure: CARDIOVERSION;  Surgeon: Corey Skains, MD;  Location: [facility];  Service: Cardiovascular;  Laterality: N/A;  . CARDIOVERSION N/A 02/06/2018   Procedure: CARDIOVERSION;  Surgeon: Josue Hector, MD;  Location: [facility];  Service: Cardiovascular;  Laterality: N/A;  . CATARACT EXTRACTION, BILATERAL  2012  . CHOLECYSTECTOMY    . colon blockage  1999  . COLON SURGERY  1999  . COLONOSCOPY  2016   polyps removed 2016  . DORSAL COMPARTMENT RELEASE Left 09/14/2016   Procedure: RELEASE DORSAL COMPARTMENT (DEQUERVAIN);  Surgeon: Dereck Leep, MD;  Location: [facility];  Service: Orthopedics;  Laterality: Left;  . ESOPHAGOGASTRODUODENOSCOPY N/A 01/27/2015   Procedure: ESOPHAGOGASTRODUODENOSCOPY (EGD);  Surgeon: Lollie Sails, MD;  Location: [facility];  Service: Endoscopy;  Laterality: N/A;  . ESOPHAGOGASTRODUODENOSCOPY (EGD) WITH PROPOFOL N/A 07/24/2015   Procedure: ESOPHAGOGASTRODUODENOSCOPY (EGD) WITH PROPOFOL;  Surgeon: Lucilla Lame,  MD;  Location: [facility] ENDOSCOPY;  Service: Endoscopy;  Laterality: N/A;  . ESOPHAGOGASTRODUODENOSCOPY (EGD) WITH PROPOFOL N/A 04/12/2018   Procedure: ESOPHAGOGASTRODUODENOSCOPY (EGD) WITH PROPOFOL;  Surgeon: Jonathon Bellows, MD;  Location: [facility] ENDOSCOPY;  Service: Gastroenterology;  Laterality: N/A;  . ESOPHAGOGASTRODUODENOSCOPY (EGD) WITH PROPOFOL N/A 06/22/2018   Procedure: ESOPHAGOGASTRODUODENOSCOPY (EGD) WITH PROPOFOL;  Surgeon: [name], MD;  Location: [facility] ENDOSCOPY;  Service: Endoscopy;  Laterality: N/A;  . ESOPHAGOGASTRODUODENOSCOPY (EGD)  WITH PROPOFOL N/A 07/09/2018   Procedure: ESOPHAGOGASTRODUODENOSCOPY (EGD) WITH PROPOFOL with resection of gastric polyps;  Surgeon: Jonathon Bellows, MD;  Location: [facility] ENDOSCOPY;  Service: Gastroenterology;  Laterality: N/A;  . ESOPHAGOGASTRODUODENOSCOPY (EGD) WITH PROPOFOL N/A 05/17/2019   Procedure: ESOPHAGOGASTRODUODENOSCOPY (EGD) WITH PROPOFOL;  Surgeon: Jonathon Bellows, MD;  Location: [facility] ENDOSCOPY;  Service: Gastroenterology;  Laterality: N/A;  . EUS N/A 06/22/2018   Procedure: UPPER ENDOSCOPIC ULTRASOUND (EUS) RADIAL;  Surgeon: [name], MD;  Location: [facility] ENDOSCOPY;  Service: Endoscopy;  Laterality: N/A;  . JOINT REPLACEMENT  2014   Bilateral Knee replacement  . LEAD REVISION/REPAIR N/A 08/29/2017   Procedure: LEAD REVISION/REPAIR;  Surgeon: Constance Haw, MD;  Location: [facility];  Service: Cardiovascular;  Laterality: N/A;  . OOPHORECTOMY    . PACEMAKER INSERTION Left 07/01/2015   Procedure: INSERTION PACEMAKER;  Surgeon: Isaias Cowman, MD;  Location: [facility];  Service: Cardiovascular;  Laterality: Left;  . PACEMAKER REVISION N/A 08/28/2017   Procedure: PACEMAKER REVISION;  Surgeon: Deboraha Sprang, MD;  Location: [facility];  Service: Cardiovascular;  Laterality: N/A;  . REPLACEMENT TOTAL KNEE BILATERAL    . SHOULDER ARTHROSCOPY WITH SUBACROMIAL DECOMPRESSION Left 2013  . TEE WITHOUT CARDIOVERSION N/A 06/28/2017   Procedure: TRANSESOPHAGEAL ECHOCARDIOGRAM (TEE);  Surgeon: Corey Skains, MD;  Location: [facility];  Service: Cardiovascular;  Laterality: N/A;    There were no vitals filed for this visit.  Subjective Assessment - 06/05/19 1308    Subjective  Patient reports feeling lots of pain at neck 6/10 and increase pain with neck rotations towards R side more than the L side. Patient also has low back pain but has been better since the last visit.    Patient is accompained by:  Family member   husband Deborah Obrien   Pertinent History  Patient is a 78  y.o. female who presents to outpatient physical therapy with a referral for secondary kyphosis of thoracic region. This patient's chief complaints consist of pain, inability to stand up straight, poor activity tolerance following recently rib fracture at 7th rib at R 04/02/2019. Patient also has a fall history that being dropped at rehab facility leading to 2 compression fractures that required kyphoplasty in late 2019. The above history leading to the following functional deficits: difficulty with ADLs, IADLs, bed mobility, transfers, household and community mobility, social and community participation.  Relevant past medical history and comorbidities include SOB with exertion; shingles (2020), chronic kidney disease (between stage III-IV according to pt), diabetes (last a1c around 7), stomach polyp that was recently surgically repaired, obesity, Hx of B rotator cuff tears (with repairs), history of 2 strokes, pacemaker with revision(properly functioning per pt 05/29/2019), achy joints, bilateral knee replacement, gout, dypsnea, acid reflux, CHF, GI bleed, hypertension, afib w/history of cardioversion, osteoporosis, repeated hospitalization with decreased endurance and functional mobility, wide range of allergies. Recent surgical procedures (in 2019): cardioversion, esophagogastroduendoscopy with propofol (x2), back surgery (pt describes kyphoplasty at 2 levels, and she mentioned prior fusion), eye surgery, diabetes, no seizures. See chart for  extensive past medical history.    Limitations  Sitting;Lifting;Standing;Walking;House hold activities    How long can you sit comfortably?  30 min    How long can you stand comfortably?  1-1.5 minutes    How long can you walk comfortably?  less than 20 feet    Diagnostic tests  04/02/2019 CI chest IMPRESSION: Mildly displaced right seventh rib fracture. Scheduled bone density exam on 06/07/2019    Patient Stated Goals  Walking longer distance, able to play with her  grandchildren as well a reduce of her back pain.    Currently in Pain?  Yes    Pain Score  6     Pain Location  Back    Pain Orientation  Upper;Mid;Lower    Pain Onset  More than a month ago       TREATMENT: Pacemaker  Therapeutic exercise:to centralize symptoms and improve ROM, strength, muscular endurance, and activity tolerance required for successful completion of functional activities. These exercises were performed separate and distinct from manual therapy) - seated thoracic extension with BUE cross over chest 2x 5 5s hold. Cuing to sitting on the edge of the chair without using back rest.  - seated shoulder flexion BUE with cane. 1x10 reps. To improve shoulder ROM, as well as strengthening at upper and lower back. Minimal to no back support.   Patient brought back brace and applied for following exercises.  - Sit to stand with BUE support on B arm rest. 2x5 reps - Ambulation 10 feet with one hand held and cane.  Patient states increase of pain at BLE hips with increased weight bearing as well as motions in walking.  -Extended time required to place and adjust back brace. Extended rest breaks provided throughout the session due to increase of pain at patients' back and bilateral hips due to mobility. - car w/c to car STS transfer with CGA for safety at end of session.   Manual therapy: to reduce pain and tissue tension, improve range of motion, neuromodulation, in order to promote improved ability to complete functional activities. - STM at upper trap and CSM at bilateral side (10 minutes in total) To improve ROM at neck side flexions and rotations.  Patient tolerated well without adverse effects. Patient also stats it felt sore while STM but not hurting. Biofreeze and therapy cream used while STM for pain relief.  - two additional sets of seated thoracic extension with BUE cross over chest 2x 5 5s hold. Cuing to sitting on the edge of the chair without using back rest. (Manual  provided at the same time in this 2 sets of exercises, and only manual billed during this time)  HOME EXERCISE PROGRAM Access Code: [email or web address]  URL: [email or web address]  Date: 06/03/2019  Prepared by: Rosita Kea   Exercises  Neck Sidebending - 5 reps - 30 seconds hold - 2x daily - 7x weekly  Standing Cervical Rotation AROM - 5 reps - 30  seconds hold - 2x daily - 7x weekly  Seated Shoulder Flexion - 3 sets - 10 reps - 2x daily - 7x weekly  Seated Thoracic Lumbar Extension - 3 sets - 10 reps - 2x daily - 7x weekly  Seated Shoulder Row with Anchored Resistance - 3 sets - 10 reps - 2x daily - 7x weekly      PT Education - 06/05/19 1637    Education Details  Exercise purpose/form.    Person(s) Educated  Patient    Methods  Explanation;Demonstration;Tactile cues;Verbal cues  Comprehension  Verbalized understanding;Returned demonstration;Verbal cues required;Tactile cues required       PT Short Term Goals - 05/29/19 1531      PT SHORT TERM GOAL #1   Title  Be independent with intial home exercise program completed at least 3 times per week for self-management of symptoms.    Baseline  initial HEP provided at IE (09/16);    Time  4    Period  Weeks    Status  New    Target Date  06/26/19        PT Long Term Goals - 05/30/19 [id number]      PT LONG TERM GOAL #1   Title  Be independent with a long-term home exercise program for self-management of symptoms.    Baseline  initial HEP provided at IE (05/29/2019);    Time  12    Period  Weeks    Status  New    Target Date  08/21/19      PT LONG TERM GOAL #2   Title  Patient will be able to ambulate > 200 feet mod I with LRAD for improved community mobility    Baseline  20 feet (05/29/2019);    Time  8    Period  Weeks    Status  New    Target Date  07/24/19      PT LONG TERM GOAL #3   Title  Reduce pain with functional activities to equal or less than 3/10 to allow patient to be more independent with usual  activities including ADLs, IADLs, and social engagement with less difficulty.     Baseline  10/10 (05/29/2019);    Time  8    Period  Weeks    Status  New    Target Date  07/24/19      PT LONG TERM GOAL #4   Title  Be able to complete 5 sit to stand test in 20 seconds to improve functional mobility for transfers and community participation.    Baseline  37.5s for 5 STS (05/29/2019);    Time  8    Period  Weeks    Status  New    Target Date  07/24/19      PT LONG TERM GOAL #5   Title  Patient will be able to complete 10 feet TUG within 40s to improve fuctional mobility for transfers and community participation.    Baseline  63s (05/29/2019);    Time  12    Period  Weeks    Status  New    Target Date  08/21/19      Additional Long Term Goals   Additional Long Term Goals  Yes      PT LONG TERM GOAL #6   Title  Demonstrate improved FOTO score by 10 units to demonstrate improvement in overall condition and self-reported functional ability.    Baseline  FOTO 30; MCD 10 (05/29/2019);    Time  12    Period  Weeks    Status  New    Target Date  08/21/19            Plan - 06/05/19 1714    Clinical Impression Statement  Patient tolerated the session fair overall with moderate increase of bilateral hip/back pain by end of session due to the increase of motions which is common response for this patient and part of her activity limitation. Patient was limited by increased pain at lumbar spine and ongoing pain at cervical spine  during treatment session. Patient demo emotional response to the pain and fatigue she experienced with exercise, but was able to recover with prolonged rest breaks and continued to be willing to participate, stating she knows this is the only way to get stronger. Manual therapy was provided at the cervical spine to attempt to alleviate pain with mild response. Patient presents increased BUE shoulder flexion motion with cane lifting exercises. However, patient  continue demonstrating poor trunk endurance, strength, and ROM. Patient is very limited in her activity tolerance. Will progress muscle strengthening/endurance, ROM in later session. The pt will benefit from skilled PT services to address deficits and return to PLOF function, independence at home, recreational activity and housework.    Personal Factors and Comorbidities  Age;Time since onset of injury/illness/exacerbation;Comorbidity 3+;Fitness    Comorbidities  SOB with exertion; shingles (2020), chronic kidney disease (between stage III-IV according to pt), diabetes (last a1c around 7), stomach polyp that was recently surgically repaired, obesity, Hx of B rotator cuff tears (with repairs), history of 2 strokes, pacemaker with revision(properly functioning per pt 05/29/2019), achy joints, bilateral knee replacement, gout, dypsnea, acid reflux, CHF, GI bleed, hypertension, afib w/history of cardioversion, osteoporosis, repeated hospitalization with decreased endurance and functional mobility, wide range of allergies. Recent surgical procedures (in 2019): cardioversion, esophagogastroduendoscopy with propofol (x2), back surgery (pt describes kyphoplasty at 2 levels, and she mentioned prior fusion), eye surgery, diabetes, no seizures. See chart for extensive past medical history.    Examination-Activity Limitations  Dressing;Sleep;Sit;Carry;Bathing;Squat;Bed Mobility;Hygiene/Grooming;Lift;Stairs;Bend;Stand;Caring for Others;Reach Overhead;Transfers;Locomotion Level;Toileting;Continence    Examination-Participation Restrictions  Cleaning;Shop;Laundry;Community Activity;Meal Prep;Yard Work;Driving;Interpersonal Relationship    Stability/Clinical Decision Making  Evolving/Moderate complexity    Rehab Potential  Fair    PT Frequency  2x / week    PT Duration  12 weeks    PT Treatment/Interventions  Cryotherapy;ADLs/Self Care Home Management;Moist Heat;Gait training;Stair training;Functional mobility  training;Therapeutic activities;Therapeutic exercise;Balance training;Neuromuscular re-education;Wheelchair mobility training;Patient/family education;Passive range of motion;Manual techniques;Dry needling;Energy conservation;Aquatic Therapy;Other (comment)   Joint mobilizations grades I-IV   PT Next Visit Plan  UE/LE strengthening, ROM, trunk extensor strengthening    PT Home Exercise Plan  Medbridge: [id number] and Agree with Plan of Care  Patient       Patient will benefit from skilled therapeutic intervention in order to improve the following deficits and impairments:  Abnormal gait, Decreased mobility, Decreased coordination, Increased muscle spasms, Postural dysfunction, Pain, Improper body mechanics, Decreased activity tolerance, Decreased endurance, Decreased range of motion, Decreased strength, Hypomobility, Impaired perceived functional ability, Impaired UE functional use, Decreased balance, Difficulty walking, Decreased safety awareness, Impaired flexibility, Obesity  Visit Diagnosis: Chronic midline low back pain without sciatica  Pain in thoracic spine  Difficulty in walking, not elsewhere classified  Muscle weakness (generalized)  Acute pain of left shoulder     Problem List Patient Active Problem List   Diagnosis Date Noted  . RUQ pain   . UTI (urinary tract infection) 06/14/2018  . Type 2 diabetes mellitus with neurological complications ([place]) 99991111  . Acute respiratory failure ([place]) 04/09/2018  . Chronic anticoagulation 04/09/2018  . Pacemaker 04/09/2018  . H/O TIA (transient ischemic attack) and stroke 04/09/2018  . CAD (coronary artery disease) 04/09/2018  . Hematemesis 04/05/2018  . Hypotension 02/04/2018  . Atrial fibrillation with RVR ([place]) 02/04/2018  . Atrial flutter ([place]) 08/24/2017  . Sinus node dysfunction ([place]) 08/24/2017  . Dizziness 06/12/2017  . Mastalgia 02/14/2017  . Acute on chronic renal insufficiency 01/09/2017  . Fatty  liver 01/09/2017  .  Benign essential HTN 09/08/2016  . Nephrolithiasis 02/22/2016  . Diabetic neuropathy with neurologic complication ([place]) [id number]  . Moderate episode of recurrent major depressive disorder ([place]) 08/11/2015  . Gastritis without bleeding   . Gastric polyp   . Gastro-esophageal reflux disease without esophagitis   . PAF (paroxysmal atrial fibrillation) ([place]) 06/29/2015  . Intestinal obstruction ([employer]) 02/20/2015  . DDD (degenerative disc disease), lumbar 01/19/2015  . Back pain 01/12/2015  . Gout 01/12/2015  . Complete tear of left rotator cuff 10/16/2014  . Intervertebral disc disorder with radiculopathy of lumbar region 09/17/2014  . Lumbar radiculitis 09/17/2014  . Lumbar stenosis with neurogenic claudication 09/17/2014  . Neuritis or radiculitis due to rupture of lumbar intervertebral disc 09/17/2014  . Atherosclerosis of both carotid arteries 07/15/2014  . Essential hypertension 07/15/2014  . Hyperlipemia, mixed 07/15/2014  . 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 Body mass index (BMI) of 40.0-44.9 in adult ([place]) 06/21/2014  . Mixed incontinence 06/25/2013  . Mixed urge and stress incontinence 06/25/2013  . Nocturia 06/25/2013  . Blurring of visual image 04/11/2012    Sherrilyn Rist, SPT 06/05/19, 6:17 PM  Everlean Alstrom. Graylon Good, PT, DPT 06/05/19, 6:18 PM   [facility] [address] [place], [address] Phone: [phone]   Fax:  [phone]  Name: Deborah Obrien MRN: [id number] Date of Birth: [birth year]

## 2019-06-07 ENCOUNTER — Ambulatory Visit
Admission: RE | Admit: 2019-06-07 | Discharge: 2019-06-07 | Disposition: A | Discharge status: Home / Self Care | Payer: Medicare Other | Source: Ambulatory Visit | Attending: Orthopaedic Surgery | Admitting: Orthopaedic Surgery

## 2019-06-07 ENCOUNTER — Other Ambulatory Visit: Payer: Self-pay

## 2019-06-07 DIAGNOSIS — S22080G Wedge compression fracture of T11-T12 vertebra, subsequent encounter for fracture with delayed healing: Secondary | ICD-10-CM

## 2019-06-07 DIAGNOSIS — S32010D Wedge compression fracture of first lumbar vertebra, subsequent encounter for fracture with routine healing: Secondary | ICD-10-CM

## 2019-06-10 ENCOUNTER — Encounter: Payer: Self-pay | Admitting: Physical Therapy

## 2019-06-10 ENCOUNTER — Ambulatory Visit: Payer: Medicare Other | Admitting: Physical Therapy

## 2019-06-10 ENCOUNTER — Other Ambulatory Visit: Payer: Self-pay

## 2019-06-10 DIAGNOSIS — M546 Pain in thoracic spine: Secondary | ICD-10-CM

## 2019-06-10 DIAGNOSIS — M6281 Muscle weakness (generalized): Secondary | ICD-10-CM

## 2019-06-10 DIAGNOSIS — G8929 Other chronic pain: Secondary | ICD-10-CM

## 2019-06-10 DIAGNOSIS — M25512 Pain in left shoulder: Secondary | ICD-10-CM

## 2019-06-10 DIAGNOSIS — R262 Difficulty in walking, not elsewhere classified: Secondary | ICD-10-CM

## 2019-06-10 DIAGNOSIS — M545 Low back pain: Secondary | ICD-10-CM | POA: Diagnosis not present

## 2019-06-10 NOTE — Therapy (Signed)
[facility] [address] [place], [address] Phone: [phone]   Fax:  [phone]  Physical Therapy Treatment  Patient Details  Name: Deborah Obrien MRN: [id number] Date of Birth: 07/21/1941 Referring Provider (PT): Eugene Garnet, MD   Encounter Date: 06/10/2019  PT End of Session - 06/10/19 1313    Visit Number  4    Number of Visits  24    Date for PT Re-Evaluation  08/21/19    Authorization Type  Primary Medicare; secondary BCBS reporting from 05/29/2019    Authorization Time Period  Current certification: [id number] to 08/21/2019    Authorization - Visit Number  4    Authorization - Number of Visits  10    PT Start Time  1301    PT Stop Time  1345    PT Time Calculation (min)  44 min    Equipment Utilized During Treatment  Gait belt    Activity Tolerance  Patient limited by pain;Patient tolerated treatment well;Patient limited by fatigue    Behavior During Therapy  [facility] for tasks assessed/performed       Past Medical History:  Diagnosis Date  . A-fib ([place]) 2016  . Acid reflux   . Anxiety   . Bowel obstruction ([place])   . CHF (congestive heart failure) ([place]) 2017  . Chronic kidney disease (CKD) stage G3a/A1, moderately decreased glomerular filtration rate (GFR) between 45-59 mL/min/1.73 square meter and albuminuria creatinine ratio less than 30 mg/g (HCC)   . Diabetes mellitus without complication ([place])   . Dyspnea   . Fatty liver   . GI bleed   . Gout   . Hypertension   . Morbid obesity ([place])   . Osteoarthritis   . Osteoporosis   . Paroxysmal atrial fibrillation (HCC)   . Presence of permanent cardiac pacemaker   . Sinus node dysfunction ([place]) 08/24/2017  . TIA (transient ischemic attack)     Past Surgical History:  Procedure Laterality Date  . ABDOMINAL HYSTERECTOMY  1980  . APPENDECTOMY    . BACK SURGERY     2008  . BACK SURGERY  2019   patient describes kyphoplasty for  compression fractures, MD referral said fusion  . BREAST BIOPSY Right 08/16   stereo fibroadenomatous change, neg for atypia  . BREAST EXCISIONAL BIOPSY Left    neg  . CARDIAC CATHETERIZATION Left 08/25/2016   Procedure: Left Heart Cath and Coronary Angiography;  Surgeon: Corey Skains, MD;  Location: [place];  Service: Cardiovascular;  Laterality: Left;  . CARDIOVERSION N/A 06/28/2017   Procedure: CARDIOVERSION;  Surgeon: Corey Skains, MD;  Location: [facility];  Service: Cardiovascular;  Laterality: N/A;  . CARDIOVERSION N/A 02/06/2018   Procedure: CARDIOVERSION;  Surgeon: Josue Hector, MD;  Location: [facility];  Service: Cardiovascular;  Laterality: N/A;  . CATARACT EXTRACTION, BILATERAL  2012  . CHOLECYSTECTOMY    . colon blockage  1999  . COLON SURGERY  1999  . COLONOSCOPY  2016   polyps removed 2016  . DORSAL COMPARTMENT RELEASE Left 09/14/2016   Procedure: RELEASE DORSAL COMPARTMENT (DEQUERVAIN);  Surgeon: Dereck Leep, MD;  Location: [facility];  Service: Orthopedics;  Laterality: Left;  . ESOPHAGOGASTRODUODENOSCOPY N/A 01/27/2015   Procedure: ESOPHAGOGASTRODUODENOSCOPY (EGD);  Surgeon: Lollie Sails, MD;  Location: [facility] ENDOSCOPY;  Service: Endoscopy;  Laterality: N/A;  . ESOPHAGOGASTRODUODENOSCOPY (EGD) WITH PROPOFOL N/A 07/24/2015   Procedure: ESOPHAGOGASTRODUODENOSCOPY (EGD) WITH PROPOFOL;  Surgeon: Lucilla Lame,  MD;  Location: [facility] ENDOSCOPY;  Service: Endoscopy;  Laterality: N/A;  . ESOPHAGOGASTRODUODENOSCOPY (EGD) WITH PROPOFOL N/A 04/12/2018   Procedure: ESOPHAGOGASTRODUODENOSCOPY (EGD) WITH PROPOFOL;  Surgeon: Jonathon Bellows, MD;  Location: [facility] ENDOSCOPY;  Service: Gastroenterology;  Laterality: N/A;  . ESOPHAGOGASTRODUODENOSCOPY (EGD) WITH PROPOFOL N/A 06/22/2018   Procedure: ESOPHAGOGASTRODUODENOSCOPY (EGD) WITH PROPOFOL;  Surgeon: [name], MD;  Location: [facility] ENDOSCOPY;  Service: Endoscopy;  Laterality: N/A;  . ESOPHAGOGASTRODUODENOSCOPY (EGD)  WITH PROPOFOL N/A 07/09/2018   Procedure: ESOPHAGOGASTRODUODENOSCOPY (EGD) WITH PROPOFOL with resection of gastric polyps;  Surgeon: Jonathon Bellows, MD;  Location: [facility] ENDOSCOPY;  Service: Gastroenterology;  Laterality: N/A;  . ESOPHAGOGASTRODUODENOSCOPY (EGD) WITH PROPOFOL N/A 05/17/2019   Procedure: ESOPHAGOGASTRODUODENOSCOPY (EGD) WITH PROPOFOL;  Surgeon: Jonathon Bellows, MD;  Location[facility] END[facility]COPY;  Service: Gastroenterology;  Laterality: N/A;  . EUS N/A 06/22/2018   Procedure: UPPER ENDOSCOPIC ULTRASOUND (EUS) RADIAL;  Surgeon: [name], MD;  Location: [facility] END[facility]COPY;  Service: Endoscopy;  Laterality: N/A;  . JOINT REPLACEMENT  2014   Bilateral Knee replacement  . LEAD REVISION/REPAIR N/A 08/29/2017   Procedure: LEAD REVISION/REPAIR;  Surgeon: Constance Haw, MD;  Location: [place] [facility];  Service: Cardiovascular;  Laterality: N/A;  . OOPHORECTOMY    . PACEMAKER INSERTION Left 07/01/2015   Procedure: INSERTION PACEMAKER;  Surgeon: Isaias Cowman, MD;  Location: [facility];  Service: Cardiovascular;  Laterality: Left;  . PACEMAKER REVISION N/A 08/28/2017   Procedure: PACEMAKER REVISION;  Surgeon: Deboraha Sprang, MD;  Location: [place];  Service: Cardiovascular;  Laterality: N/A;  . REPLACEMENT TOTAL KNEE BILATERAL    . SHOULDER ARTHROSCOPY WITH SUBACROMIAL DECOMPRESSION Left 2013  . TEE WITHOUT CARDIOVERSION N/A 06/28/2017   Procedure: TRANSESOPHAGEAL ECHOCARDIOGRAM (TEE);  Surgeon: Corey Skains, MD;  Location: [facility];  Service: Cardiovascular;  Laterality: N/A;    There were no vitals filed for this visit.  Subjective Assessment - 06/10/19 1301    Subjective  Patient reports eating a cake last night and this morning her blood sugar was 538 mmol/L and she took some insuline. The blood sugar wsa 227 mmol/L right before the session. Only 3/10 at upper back and L shoulder pain. Patient reports she felt much better today and only intermittently pain now.  Patient had a near fall over the past weekend when she tried to step over the bath tub and didn't realize that there was no non-slip mat avaliable at bath tub. Patient holded the bar with L hand and asked his husband to help her get in and out of the bath tub eventually. Patient didn't have any physical hurt resulting from this near fall.    Patient is accompained by:  Family member   [name]   Pertinent History  Patient is a 78 y.o. female who presents to outpatient physical therapy with a referral for secondary kyphosis of thoracic region. This patient's chief complaints consist of pain, inability to stand up straight, poor activity tolerance following recently rib fracture at 7th rib at R 04/02/2019. Patient also has a fall history that being dropped at rehab facility leading to 2 compression fractures that required kyphoplasty in late 2019. The above history leading to the following functional deficits: difficulty with ADLs, IADLs, bed mobility, transfers, household and community mobility, social and community participation.  Relevant past medical history and comorbidities include SOB with exertion; shingles (2020), chronic kidney disease (between stage III-IV according to pt), diabetes (last a1c around 7), stomach polyp that was recently surgically repaired, obesity,  Hx of B rotator cuff tears (with repairs), history of 2 strokes, pacemaker with revision(properly functioning per pt 05/29/2019), achy joints, bilateral knee replacement, gout, dypsnea, acid reflux, CHF, GI bleed, hypertension, afib w/history of cardioversion, osteoporosis, repeated hospitalization with decreased endurance and functional mobility, wide range of allergies. Recent surgical procedures (in 2019): cardioversion, esophagogastroduendoscopy with propofol (x2), back surgery (pt describes kyphoplasty at 2 levels, and she mentioned prior fusion), eye surgery, diabetes, no seizures. See chart for extensive past medical history.     Limitations  Sitting;Lifting;Standing;Walking;House hold activities    How long can you sit comfortably?  30 min    How long can you stand comfortably?  1-1.5 minutes    How long can you walk comfortably?  less than 20 feet    Diagnostic tests  04/02/2019 CI chest IMPRESSION: Mildly displaced right seventh rib fracture. Scheduled bone density exam on 06/07/2019    Patient Stated Goals  Walking longer distance, able to play with her grandchildren as well a reduce of her back pain.    Currently in Pain?  Yes    Pain Score  3     Pain Location  Back    Pain Orientation  Upper    Pain Descriptors / Indicators  Constant    Pain Type  Chronic pain    Pain Onset  More than a month ago     Blood sugar: patient self-reported 227 mmol/L before session.  TREATMENT:  Pacemaker  Therapeutic exercise:to centralize symptoms and improve ROM, strength, muscular endurance, and activity tolerance required for successful completion of functional activities. -NuSteplevel1withusing bilateral upper and lower extremeities.Setting9. For improved extremity mobility, muscular endurance, and activity tolerance; and to induce the analgesic effect of aerobic exercise, stimulate improved joint nutrition, and prepare body structures and systems for following interventions.38minutes.73spm, 0.49 mile.Cuing to improve SPM up to and soft end control with stepping. - seatedthoracic extension withBUE cross over chest2x15 3s hold. Cuing to sitting on the edge of the chair without using back rest. Cuing to using B shoulders abduction to guide thoracic extension.  - seated shoulder flexion BUE with cane. 3x15 reps. To improve shoulder ROM, as well as strengthening at upper and lower back. Minimal to no back support.   - seated shoulder abduction with 1lb dumbbell at each hand. 2 x 10 reps. Back unsupported to improve trunk endurance.  - Seated shoulder horizontal abduction 1lb dumbbell at each hand. 2x10 reps.   Back unsupported to improve trunk endurance.  - Ambulation 100 feet with CGA and cane and no increase of pain at patient's back or hips. Patient presents fatigue at the end with increased postural sway but able to safely return to sitting at her wheelchair.  - Additional cuing provide to proper sitting posture without using back rest, proper techniques to rest without additional pressure at her lower back(glutes first and then resting at back), as well as TA activation to improve stability at lower back and pain control.  - Extensive education provided for transfer safety in and out from bath tub using additional sitting chair and body sequencing.  - W/C to vehicle transfer with SBA for safety at end of session.    HOME EXERCISE PROGRAM Access Code: [email or web address]  URL: [email or web address]  Date: 06/03/2019  Prepared by: Rosita Kea   Exercises  Neck Sidebending - 5 reps - 30 seconds hold - 2x daily - 7x weekly  Standing Cervical Rotation AROM - 5 reps - 30 seconds hold - 2x daily - 7x weekly  Seated Shoulder Flexion - 3 sets - 10 reps - 2x daily - 7x weekly  Seated Thoracic Lumbar Extension - 3 sets - 10 reps - 2x daily - 7x weekly  Seated Shoulder Row with Anchored Resistance - 3 sets - 10 reps - 2x daily - 7x weekly   Patient tolerated the session well today and no increase of back pain at the end session. Patient demonstrated improve shoulder pain free ROM and activity tolerance with BUEs. Patient also presents improved ambulation tolerance. Patient overall presented good today and will keep focusing exercises that improve thoracic extension, strengthening and activity endurance. Will progress muscle strengthening/endurance, ROM in later session. The pt will benefit from skilled PT services to address deficits and return to PLOF function, independence at home, recreational activity and housework.   PT Education - 06/10/19 1505    Education Details  Exercise purpose/form. fall  risk reduction strategies including getting in and out of the tub safely with tub chair and supervision from husband.    Person(s) Educated  Patient    Methods  Explanation;Demonstration;Tactile cues;Verbal cues    Comprehension  Verbalized understanding;Returned demonstration;Tactile cues required;Verbal cues required       PT Short Term Goals - 05/29/19 1531      PT SHORT TERM GOAL #1   Title  Be independent with intial home exercise program completed at least 3 times per week for self-management of symptoms.    Baseline  initial HEP provided at IE (09/16);    Time  4    Period  Weeks    Status  New    Target Date  06/26/19        PT Long Term Goals - 05/30/19 [id number]      PT LONG TERM GOAL #1   Title  Be independent with a long-term home exercise program for self-management of symptoms.    Baseline  initial HEP provided at IE (05/29/2019);    Time  12    Period  Weeks    Status  New    Target Date  08/21/19      PT LONG TERM GOAL #2   Title  Patient will be able to ambulate > 200 feet mod I with LRAD for improved community mobility    Baseline  20 feet (05/29/2019);    Time  8    Period  Weeks    Status  New    Target Date  07/24/19      PT LONG TERM GOAL #3   Title  Reduce pain with functional activities to equal or less than 3/10 to allow patient to be more independent with usual activities including ADLs, IADLs, and social engagement with less difficulty.     Baseline  10/10 (05/29/2019);    Time  8    Period  Weeks    Status  New    Target Date  07/24/19      PT LONG TERM GOAL #4   Title  Be able to complete 5 sit to stand test in 20 seconds to improve functional mobility for transfers and community participation.    Baseline  37.5s for 5 STS (05/29/2019);    Time  8    Period  Weeks    Status  New    Target Date  07/24/19      PT LONG TERM GOAL #5   Title  Patient will be able to complete 10 feet TUG within 40s to improve fuctional mobility for transfers  and  community participation.    Baseline  63s (05/29/2019);    Time  12    Period  Weeks    Status  New    Target Date  08/21/19      Additional Long Term Goals   Additional Long Term Goals  Yes      PT LONG TERM GOAL #6   Title  Demonstrate improved FOTO score by 10 units to demonstrate improvement in overall condition and self-reported functional ability.    Baseline  FOTO 30; MCD 10 (05/29/2019);    Time  12    Period  Weeks    Status  New    Target Date  08/21/19        Plan - 06/10/19 1506    Clinical Impression Statement  Patient tolerated the session well today and no increase of back pain at the end session. Patient demonstrated improve shoulder pain free ROM and activity tolerance with BUEs. Patient also presents improved ambulation tolerance. Patient overall presented good today and will keep focusing exercises that improve thoracic extension, strengthening and activity endurance. Will progress muscle strengthening/endurance, ROM in later session. The pt will benefit from skilled PT services to address deficits and return to PLOF function, independence at home, recreational activity and housework.    Personal Factors and Comorbidities  Age;Time since onset of injury/illness/exacerbation;Comorbidity 3+;Fitness    Comorbidities  SOB with exertion; shingles (2020), chronic kidney disease (between stage III-IV according to pt), diabetes (last a1c around 7), stomach polyp that was recently surgically repaired, obesity, Hx of B rotator cuff tears (with repairs), history of 2 strokes, pacemaker with revision(properly functioning per pt 05/29/2019), achy joints, bilateral knee replacement, gout, dypsnea, acid reflux, CHF, GI bleed, hypertension, afib w/history of cardioversion, osteoporosis, repeated hospitalization with decreased endurance and functional mobility, wide range of allergies. Recent surgical procedures (in 2019): cardioversion, esophagogastroduendoscopy with propofol (x2), back  surgery (pt describes kyphoplasty at 2 levels, and she mentioned prior fusion), eye surgery, diabetes, no seizures. See chart for extensive past medical history.    Examination-Activity Limitations  Dressing;Sleep;Sit;Carry;Bathing;Squat;Bed Mobility;Hygiene/Grooming;Lift;Stairs;Bend;Stand;Caring for Others;Reach Overhead;Transfers;Locomotion Level;Toileting;Continence    Examination-Participation Restrictions  Cleaning;Shop;Laundry;Community Activity;Meal Prep;Yard Work;Driving;Interpersonal Relationship    Stability/Clinical Decision Making  Evolving/Moderate complexity    Rehab Potential  Fair    PT Frequency  2x / week    PT Duration  12 weeks    PT Treatment/Interventions  Cryotherapy;ADLs/Self Care Home Management;Moist Heat;Gait training;Stair training;Functional mobility training;Therapeutic activities;Therapeutic exercise;Balance training;Neuromuscular re-education;Wheelchair mobility training;Patient/family education;Passive range of motion;Manual techniques;Dry needling;Energy conservation;Aquatic Therapy;Other (comment)   Joint mobilizations grades I-IV   PT Next Visit Plan  UE/LE strengthening, ROM, trunk extensor strengthening    PT Home Exercise Plan  Medbridge: [id number] and Agree with Plan of Care  Patient         Patient will benefit from skilled therapeutic intervention in order to improve the following deficits and impairments:  Abnormal gait, Decreased mobility, Decreased coordination, Increased muscle spasms, Postural dysfunction, Pain, Improper body mechanics, Decreased activity tolerance, Decreased endurance, Decreased range of motion, Decreased strength, Hypomobility, Impaired perceived functional ability, Impaired UE functional use, Decreased balance, Difficulty walking, Decreased safety awareness, Impaired flexibility, Obesity  Visit Diagnosis: Chronic midline low back pain without sciatica  Pain in thoracic spine  Difficulty in walking, not elsewhere  classified  Muscle weakness (generalized)  Acute pain of left shoulder     Problem List Patient Active Problem List   Diagnosis Date Noted  . RUQ pain   .  UTI (urinary tract infection) 06/14/2018  . Type 2 diabetes mellitus with neurological complications ([place]) 99991111  . Acute respiratory failure ([place]) 04/09/2018  . Chronic anticoagulation 04/09/2018  . Pacemaker 04/09/2018  . H/O TIA (transient ischemic attack) and stroke 04/09/2018  . CAD (coronary artery disease) 04/09/2018  . Hematemesis 04/05/2018  . Hypotension 02/04/2018  . Atrial fibrillation with RVR ([place]) 02/04/2018  . Atrial flutter ([place]) 08/24/2017  . Sinus node dysfunction ([place]) 08/24/2017  . Dizziness 06/12/2017  . Mastalgia 02/14/2017  . Acute on chronic renal insufficiency 01/09/2017  . Fatty liver 01/09/2017  . Benign essential HTN 09/08/2016  . Nephrolithiasis 02/22/2016  . Diabetic neuropathy with neurologic complication ([place]) [id number]  . Moderate episode of recurrent major depressive disorder ([place]) 08/11/2015  . Gastritis without bleeding   . Gastric polyp   . Gastro-esophageal reflux disease without esophagitis   . PAF (paroxysmal atrial fibrillation) ([place]) 06/29/2015  . Intestinal obstruction ([place]) 02/20/2015  . DDD (degenerative disc disease), lumbar 01/19/2015  . Back pain 01/12/2015  . Gout 01/12/2015  . Complete tear of left rotator cuff 10/16/2014  . Intervertebral disc disorder with radiculopathy of lumbar region 09/17/2014  . Lumbar radiculitis 09/17/2014  . Lumbar stenosis with neurogenic claudication 09/17/2014  . Neuritis or radiculitis due to rupture of lumbar intervertebral disc 09/17/2014  . Atherosclerosis of both carotid arteries 07/15/2014  . Essential hypertension 07/15/2014  . Hyperlipemia, mixed 07/15/2014  . 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 Body mass index (BMI) of 40.0-44.9 in adult ([place]) 06/21/2014  . Mixed incontinence 06/25/2013  . Mixed urge and stress  incontinence 06/25/2013  . Nocturia 06/25/2013  . Blurring of visual image 04/11/2012    Sherrilyn Rist, SPT 06/10/19, 3:52 PM  Everlean Alstrom. Graylon Good, PT, DPT 06/10/19, 3:55 PM   [facility] [address], [place], [address] Phone: [phone]   Fax:  [phone]  Name: Deborah Obrien MRN: [id number] Date of Birth: September 22, 1940

## 2019-06-12 ENCOUNTER — Encounter: Payer: Self-pay | Admitting: Physical Therapy

## 2019-06-12 ENCOUNTER — Ambulatory Visit: Payer: Medicare Other | Admitting: Physical Therapy

## 2019-06-12 ENCOUNTER — Other Ambulatory Visit: Payer: Self-pay

## 2019-06-12 DIAGNOSIS — M25512 Pain in left shoulder: Secondary | ICD-10-CM

## 2019-06-12 DIAGNOSIS — M546 Pain in thoracic spine: Secondary | ICD-10-CM

## 2019-06-12 DIAGNOSIS — M545 Low back pain, unspecified: Secondary | ICD-10-CM

## 2019-06-12 DIAGNOSIS — R262 Difficulty in walking, not elsewhere classified: Secondary | ICD-10-CM

## 2019-06-12 DIAGNOSIS — G8929 Other chronic pain: Secondary | ICD-10-CM

## 2019-06-12 DIAGNOSIS — M6281 Muscle weakness (generalized): Secondary | ICD-10-CM

## 2019-06-12 NOTE — Therapy (Signed)
[facility] [address], [place], [address] Phone: [phone]   Fax:  [phone]  Physical Therapy Treatment  Patient Details  Name: Deborah Obrien MRN: [id number] Date of Birth: 1941-04-27 Referring Provider (Deborah Obrien): Eugene Garnet, MD   Encounter Date: 06/12/2019  Deborah Obrien End of Session - 06/12/19 1353    Visit Number  5    Number of Visits  24    Date for Deborah Obrien Re-Evaluation  08/21/19    Authorization Type  Primary Medicare; secondary BCBS reporting from 05/29/2019    Authorization Time Period  Current certification: [id number] to 08/21/2019    Authorization - Visit Number  5    Authorization - Number of Visits  10    Deborah Obrien Start Time  1301    Deborah Obrien Stop Time  1346    Deborah Obrien Time Calculation (min)  45 min    Equipment Utilized During Treatment  Gait belt    Activity Tolerance  Patient limited by pain;Patient tolerated treatment well;Patient limited by fatigue    Behavior During Therapy  [facility] for tasks assessed/performed       Past Medical History:  Diagnosis Date  . A-fib ([place]) 2016  . Acid reflux   . Anxiety   . Bowel obstruction ([place])   . CHF (congestive heart failure) ([place]) 2017  . Chronic kidney disease (CKD) stage G3a/A1, moderately decreased glomerular filtration rate (GFR) between 45-59 mL/min/1.73 square meter and albuminuria creatinine ratio less than 30 mg/g (HCC)   . Diabetes mellitus without complication ([place])   . Dyspnea   . Fatty liver   . GI bleed   . Gout   . Hypertension   . Morbid obesity ([place])   . Osteoarthritis   . Osteoporosis   . Paroxysmal atrial fibrillation (HCC)   . Presence of permanent cardiac pacemaker   . Sinus node dysfunction ([place]) 08/24/2017  . TIA (transient ischemic attack)     Past Surgical History:  Procedure Laterality Date  . ABDOMINAL HYSTERECTOMY  1980  . APPENDECTOMY    . BACK SURGERY     2008  . BACK SURGERY  2019   patient describes kyphoplasty for  compression fractures, MD referral said fusion  . BREAST BIOPSY Right 08/16   stereo fibroadenomatous change, neg for atypia  . BREAST EXCISIONAL BIOPSY Left    neg  . CARDIAC CATHETERIZATION Left 08/25/2016   Procedure: Left Heart Cath and Coronary Angiography;  Surgeon: Corey Skains, MD;  Location: [facility];  Service: Cardiovascular;  Laterality: Left;  . CARDIOVERSION N/A 06/28/2017   Procedure: CARDIOVERSION;  Surgeon: Corey Skains, MD;  Location: [facility];  Service: Cardiovascular;  Laterality: N/A;  . CARDIOVERSION N/A 02/06/2018   Procedure: CARDIOVERSION;  Surgeon: Josue Hector, MD;  Location: [facility];  Service: Cardiovascular;  Laterality: N/A;  . CATARACT EXTRACTION, BILATERAL  2012  . CHOLECYSTECTOMY    . colon blockage  1999  . COLON SURGERY  1999  . COLONOSCOPY  2016   polyps removed 2016  . DORSAL COMPARTMENT RELEASE Left 09/14/2016   Procedure: RELEASE DORSAL COMPARTMENT (DEQUERVAIN);  Surgeon: Dereck Leep, MD;  Location: [facility];  Service: Orthopedics;  Laterality: Left;  . ESOPHAGOGASTRODUODENOSCOPY N/A 01/27/2015   Procedure: ESOPHAGOGASTRODUODENOSCOPY (EGD);  Surgeon: Lollie Sails, MD;  Location: [facility];  Service: Endoscopy;  Laterality: N/A;  . ESOPHAGOGASTRODUODENOSCOPY (EGD) WITH PROPOFOL N/A 07/24/2015   Procedure: ESOPHAGOGASTRODUODENOSCOPY (EGD) WITH PROPOFOL;  Surgeon: Lucilla Lame,  MD;  Location: [facility] ENDOSCOPY;  Service: Endoscopy;  Laterality: N/A;  . ESOPHAGOGASTRODUODENOSCOPY (EGD) WITH PROPOFOL N/A 04/12/2018   Procedure: ESOPHAGOGASTRODUODENOSCOPY (EGD) WITH PROPOFOL;  Surgeon: Jonathon Bellows, MD;  Location: [facility] ENDOSCOPY;  Service: Gastroenterology;  Laterality: N/A;  . ESOPHAGOGASTRODUODENOSCOPY (EGD) WITH PROPOFOL N/A 06/22/2018   Procedure: ESOPHAGOGASTRODUODENOSCOPY (EGD) WITH PROPOFOL;  Surgeon: [name], MD;  Location: [facility] ENDOSCOPY;  Service: Endoscopy;  Laterality: N/A;  . ESOPHAGOGASTRODUODENOSCOPY (EGD)  WITH PROPOFOL N/A 07/09/2018   Procedure: ESOPHAGOGASTRODUODENOSCOPY (EGD) WITH PROPOFOL with resection of gastric polyps;  Surgeon: Jonathon Bellows, MD;  Location: [facility] ENDOSCOPY;  Service: Gastroenterology;  Laterality: N/A;  . ESOPHAGOGASTRODUODENOSCOPY (EGD) WITH PROPOFOL N/A 05/17/2019   Procedure: ESOPHAGOGASTRODUODENOSCOPY (EGD) WITH PROPOFOL;  Surgeon: Jonathon Bellows, MD;  Location: [facility] ENDOSCOPY;  Service: Gastroenterology;  Laterality: N/A;  . EUS N/A 06/22/2018   Procedure: UPPER ENDOSCOPIC ULTRASOUND (EUS) RADIAL;  Surgeon: [name], MD;  Location: [facility] ENDOSCOPY;  Service: Endoscopy;  Laterality: N/A;  . JOINT REPLACEMENT  2014   Bilateral Knee replacement  . LEAD REVISION/REPAIR N/A 08/29/2017   Procedure: LEAD REVISION/REPAIR;  Surgeon: Constance Haw, MD;  Location: [facility];  Service: Cardiovascular;  Laterality: N/A;  . OOPHORECTOMY    . PACEMAKER INSERTION Left 07/01/2015   Procedure: INSERTION PACEMAKER;  Surgeon: Isaias Cowman, MD;  Location: [facility];  Service: Cardiovascular;  Laterality: Left;  . PACEMAKER REVISION N/A 08/28/2017   Procedure: PACEMAKER REVISION;  Surgeon: Deboraha Sprang, MD;  Location: [facility];  Service: Cardiovascular;  Laterality: N/A;  . REPLACEMENT TOTAL KNEE BILATERAL    . SHOULDER ARTHROSCOPY WITH SUBACROMIAL DECOMPRESSION Left 2013  . TEE WITHOUT CARDIOVERSION N/A 06/28/2017   Procedure: TRANSESOPHAGEAL ECHOCARDIOGRAM (TEE);  Surgeon: Corey Skains, MD;  Location: [facility];  Service: Cardiovascular;  Laterality: N/A;    There were no vitals filed for this visit.  Subjective Assessment - 06/12/19 1310    Subjective  Patient reports no pain at back but 3/10 for her L shoulder. She states she has been feeling better continuously since the Deborah Obrien treatment.    Patient is accompained by:  Family member   Deborah Obrien   Pertinent History  Patient is a 78 y.o. female who presents to outpatient physical therapy with a  referral for secondary kyphosis of thoracic region. This patient's chief complaints consist of pain, inability to stand up straight, poor activity tolerance following recently rib fracture at 7th rib at R 04/02/2019. Patient also has a fall history that being dropped at rehab facility leading to 2 compression fractures that required kyphoplasty in late 2019. The above history leading to the following functional deficits: difficulty with ADLs, IADLs, bed mobility, transfers, household and community mobility, social and community participation.  Relevant past medical history and comorbidities include SOB with exertion; shingles (2020), chronic kidney disease (between stage III-IV according to Deborah Obrien), diabetes (last a1c around 7), stomach polyp that was recently surgically repaired, obesity, Hx of B rotator cuff tears (with repairs), history of 2 strokes, pacemaker with revision(properly functioning per Deborah Obrien 05/29/2019), achy joints, bilateral knee replacement, gout, dypsnea, acid reflux, CHF, GI bleed, hypertension, afib w/history of cardioversion, osteoporosis, repeated hospitalization with decreased endurance and functional mobility, wide range of allergies. Recent surgical procedures (in 2019): cardioversion, esophagogastroduendoscopy with propofol (x2), back surgery (Deborah Obrien describes kyphoplasty at 2 levels, and she mentioned prior fusion), eye surgery, diabetes, no seizures. See chart for extensive past medical history.    Limitations  Sitting;Lifting;Standing;Walking;House hold activities  How long can you sit comfortably?  30 min    How long can you stand comfortably?  1-1.5 minutes    How long can you walk comfortably?  less than 20 feet    Diagnostic tests  04/02/2019 CI chest IMPRESSION: Mildly displaced right seventh rib fracture. Scheduled bone density exam on 06/07/2019    Patient Stated Goals  Walking longer distance, able to play with her grandchildren as well a reduce of her back pain.    Currently in  Pain?  Yes    Pain Score  3     Pain Location  Shoulder    Pain Orientation  Left    Pain Descriptors / Indicators  Contraction    Pain Onset  More than a month ago      Blood sugar: patient self-reported 175 mmol/L before session.   TREATMENT:    Pacemaker    Therapeutic exercise: to centralize symptoms and improve ROM, strength, muscular endurance, and activity tolerance required for successful completion of functional activities.  - NuStep level 3/4 with using bilateral upper and lower extremities. Setting 9. For improved extremity mobility, muscular endurance, and activity tolerance; and to induce the analgesic effect of aerobic exercise, stimulate improved joint nutrition, and prepare body structures and systems for following interventions. 8 minutes. 73 spm, 0.49  mile. Cuing to improve SPM up to and soft end control with stepping.  - seated thoracic extension with BUE cross over chest 2x15 3s hold. Cuing to sitting on the edge of the chair without using back rest.   - seated shoulder flexion BUE with weighted stick. 3x15 reps. To improve shoulder ROM, as well as strengthening at upper and lower back. Minimal to no back support.   - seated shoulder abduction with 2lb dumbbell at each hand. 2 x 10 reps. Back unsupported to improve trunk endurance.  - Seated shoulder horizontal abduction 2lb dumbbell at each hand. 2x10 reps.  Back unsupported to improve trunk endurance.  - Ambulation 10 x 8 feet (5reps with 3 yellow hurdles) along the bar at treadmill with CGA and no increase of pain at patient's back or hips. One UE hold on bar intermittently for balance. Patient presented minimal to moderate postural sway and no tiredness at the end of the session - Additional cuing provide to proper sitting posture without using back rest and TA activation to improve stability at lower back and pain control.  - Ambulation 200 feet with gait belt, CGA, and cane to vehicle for safety and muscle endurance  training at end of session.     HOME EXERCISE PROGRAM Access Code: [email or web address]  URL: [email or web address]  Date: 06/03/2019  Prepared by: Rosita Kea    Exercises  Neck Sidebending - 5 reps - 30 seconds hold - 2x daily - 7x weekly  Standing Cervical Rotation AROM - 5 reps - 30 seconds hold - 2x daily - 7x weekly  Seated Shoulder Flexion - 3 sets - 10 reps - 2x daily - 7x weekly  Seated Thoracic Lumbar Extension - 3 sets - 10 reps - 2x daily - 7x weekly  Seated Shoulder Row with Anchored Resistance - 3 sets - 10 reps - 2x daily - 7x weekly     Patient tolerated the session well today and no complains of increased of pain at her back and shoulder at end of the session. Patient demonstrated improved shoulder pain free ROM and strengthening. Patient also presented great gain for activity tolerance with walking. However, patient still requires  cuing to improve proper techniques and body mechanics for strengthening exercise to maintain the gain for pain control at shoulder. Will progress muscle strengthening/endurance, ROM in later session. The Deborah Obrien will benefit from skilled Deborah Obrien services to address deficits and return to PLOF function, independence at home, recreational activity and housework.    Deborah Obrien Education - 06/12/19 1352    Education Details  Exercise purpose/form.    Person(s) Educated  Patient    Methods  Explanation;Demonstration;Tactile cues;Verbal cues    Comprehension  Verbalized understanding;Returned demonstration;Verbal cues required;Tactile cues required       Deborah Obrien Short Term Goals - 05/29/19 1531      Deborah Obrien SHORT TERM GOAL #1   Title  Be independent with intial home exercise program completed at least 3 times per week for self-management of symptoms.    Baseline  initial HEP provided at IE (09/16);    Time  4    Period  Weeks    Status  New    Target Date  06/26/19        Deborah Obrien Long Term Goals - 05/30/19 [id number]      Deborah Obrien LONG TERM GOAL #1   Title  Be independent  with a long-term home exercise program for self-management of symptoms.    Baseline  initial HEP provided at IE (05/29/2019);    Time  12    Period  Weeks    Status  New    Target Date  08/21/19      Deborah Obrien LONG TERM GOAL #2   Title  Patient will be able to ambulate > 200 feet mod I with LRAD for improved community mobility    Baseline  20 feet (05/29/2019);    Time  8    Period  Weeks    Status  New    Target Date  07/24/19      Deborah Obrien LONG TERM GOAL #3   Title  Reduce pain with functional activities to equal or less than 3/10 to allow patient to be more independent with usual activities including ADLs, IADLs, and social engagement with less difficulty.     Baseline  10/10 (05/29/2019);    Time  8    Period  Weeks    Status  New    Target Date  07/24/19      Deborah Obrien LONG TERM GOAL #4   Title  Be able to complete 5 sit to stand test in 20 seconds to improve functional mobility for transfers and community participation.    Baseline  37.5s for 5 STS (05/29/2019);    Time  8    Period  Weeks    Status  New    Target Date  07/24/19      Deborah Obrien LONG TERM GOAL #5   Title  Patient will be able to complete 10 feet TUG within 40s to improve fuctional mobility for transfers and community participation.    Baseline  63s (05/29/2019);    Time  12    Period  Weeks    Status  New    Target Date  08/21/19      Additional Long Term Goals   Additional Long Term Goals  Yes      Deborah Obrien LONG TERM GOAL #6   Title  Demonstrate improved FOTO score by 10 units to demonstrate improvement in overall condition and self-reported functional ability.    Baseline  FOTO 30; MCD 10 (05/29/2019);    Time  12    Period  Weeks  Status  New    Target Date  08/21/19            Plan - 06/12/19 1910    Clinical Impression Statement  Patient tolerated the session well today and no complains of increased of pain at her back and shoulder at end of the session. Patient demonstrated improved shoulder pain free ROM and  strengthening. Patient also presented great gain for activity tolerance with walking. However, patient still requires cuing to improve proper techniques and body mechanics for strengthening exercise to maintain the gain for pain control at shoulder. Will progress muscle strengthening/endurance, ROM in later session. The Deborah Obrien will benefit from skilled Deborah Obrien services to address deficits and return to PLOF function, independence at home, recreational activity and housework.    Personal Factors and Comorbidities  Age;Time since onset of injury/illness/exacerbation;Comorbidity 3+;Fitness    Comorbidities  SOB with exertion; shingles (2020), chronic kidney disease (between stage III-IV according to Deborah Obrien), diabetes (last a1c around 7), stomach polyp that was recently surgically repaired, obesity, Hx of B rotator cuff tears (with repairs), history of 2 strokes, pacemaker with revision(properly functioning per Deborah Obrien 05/29/2019), achy joints, bilateral knee replacement, gout, dypsnea, acid reflux, CHF, GI bleed, hypertension, afib w/history of cardioversion, osteoporosis, repeated hospitalization with decreased endurance and functional mobility, wide range of allergies. Recent surgical procedures (in 2019): cardioversion, esophagogastroduendoscopy with propofol (x2), back surgery (Deborah Obrien describes kyphoplasty at 2 levels, and she mentioned prior fusion), eye surgery, diabetes, no seizures. See chart for extensive past medical history.    Examination-Activity Limitations  Dressing;Sleep;Sit;Carry;Bathing;Squat;Bed Mobility;Hygiene/Grooming;Lift;Stairs;Bend;Stand;Caring for Others;Reach Overhead;Transfers;Locomotion Level;Toileting;Continence    Examination-Participation Restrictions  Cleaning;Shop;Laundry;Community Activity;Meal Prep;Yard Work;Driving;Interpersonal Relationship    Stability/Clinical Decision Making  Evolving/Moderate complexity    Rehab Potential  Fair    Deborah Obrien Frequency  2x / week    Deborah Obrien Duration  12 weeks    Deborah Obrien  Treatment/Interventions  Cryotherapy;ADLs/Self Care Home Management;Moist Heat;Gait training;Stair training;Functional mobility training;Therapeutic activities;Therapeutic exercise;Balance training;Neuromuscular re-education;Wheelchair mobility training;Patient/family education;Passive range of motion;Manual techniques;Dry needling;Energy conservation;Aquatic Therapy;Other (comment)   Joint mobilizations grades I-IV   Deborah Obrien Next Visit Plan  UE/LE strengthening, ROM, trunk extensor strengthening    Deborah Obrien Home Exercise Plan  Medbridge: [id number] and Agree with Plan of Care  Patient       Patient will benefit from skilled therapeutic intervention in order to improve the following deficits and impairments:  Abnormal gait, Decreased mobility, Decreased coordination, Increased muscle spasms, Postural dysfunction, Pain, Improper body mechanics, Decreased activity tolerance, Decreased endurance, Decreased range of motion, Decreased strength, Hypomobility, Impaired perceived functional ability, Impaired UE functional use, Decreased balance, Difficulty walking, Decreased safety awareness, Impaired flexibility, Obesity  Visit Diagnosis: Chronic midline low back pain without sciatica  Pain in thoracic spine  Difficulty in walking, not elsewhere classified  Muscle weakness (generalized)  Acute pain of left shoulder  Acute low back pain without sciatica, unspecified back pain laterality     Problem List Patient Active Problem List   Diagnosis Date Noted  . RUQ pain   . UTI (urinary tract infection) 06/14/2018  . Type 2 diabetes mellitus with neurological complications ([place]) 99991111  . Acute respiratory failure ([place]) 04/09/2018  . Chronic anticoagulation 04/09/2018  . Pacemaker 04/09/2018  . H/O TIA (transient ischemic attack) and stroke 04/09/2018  . CAD (coronary artery disease) 04/09/2018  . Hematemesis 04/05/2018  . Hypotension 02/04/2018  . Atrial fibrillation with RVR ([place])  02/04/2018  . Atrial flutter ([place]) 08/24/2017  . Sinus node dysfunction ([place]) 08/24/2017  .  Dizziness 06/12/2017  . Mastalgia 02/14/2017  . Acute on chronic renal insufficiency 01/09/2017  . Fatty liver 01/09/2017  . Benign essential HTN 09/08/2016  . Nephrolithiasis 02/22/2016  . Diabetic neuropathy with neurologic complication ([place]) [id number]  . Moderate episode of recurrent major depressive disorder ([place]) 08/11/2015  . Gastritis without bleeding   . Gastric polyp   . Gastro-esophageal reflux disease without esophagitis   . PAF (paroxysmal atrial fibrillation) ([place]) 06/29/2015  . Intestinal obstruction ([place]) 02/20/2015  . DDD (degenerative disc disease), lumbar 01/19/2015  . Back pain 01/12/2015  . Gout 01/12/2015  . Complete tear of left rotator cuff 10/16/2014  . Intervertebral disc disorder with radiculopathy of lumbar region 09/17/2014  . Lumbar radiculitis 09/17/2014  . Lumbar stenosis with neurogenic claudication 09/17/2014  . Neuritis or radiculitis due to rupture of lumbar intervertebral disc 09/17/2014  . Atherosclerosis of both carotid arteries 07/15/2014  . Essential hypertension 07/15/2014  . Hyperlipemia, mixed 07/15/2014  . 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 Body mass index (BMI) of 40.0-44.9 in adult ([place]) 06/21/2014  . Mixed incontinence 06/25/2013  . Mixed urge and stress incontinence 06/25/2013  . Nocturia 06/25/2013  . Blurring of visual image 04/11/2012   .Deborah Obrien, Deborah Obrien 06/12/19, 7:25 PM  Deborah Obrien, Deborah Obrien, Deborah Obrien 06/12/19, 7:26 PM   [facility] [address] [place], [address] Phone: [phone]   Fax:  [phone]  Name: Deborah Obrien MRN: [id number] Date of Birth: March 14, 1941

## 2019-06-16 ENCOUNTER — Telehealth: Payer: Self-pay | Admitting: Internal Medicine

## 2019-06-17 ENCOUNTER — Other Ambulatory Visit: Payer: Self-pay

## 2019-06-17 ENCOUNTER — Encounter: Payer: Self-pay | Admitting: Physical Therapy

## 2019-06-17 ENCOUNTER — Ambulatory Visit: Payer: Medicare Other | Attending: Orthopaedic Surgery | Admitting: Physical Therapy

## 2019-06-17 DIAGNOSIS — M25512 Pain in left shoulder: Secondary | ICD-10-CM | POA: Insufficient documentation

## 2019-06-17 DIAGNOSIS — G8929 Other chronic pain: Secondary | ICD-10-CM | POA: Insufficient documentation

## 2019-06-17 DIAGNOSIS — R262 Difficulty in walking, not elsewhere classified: Secondary | ICD-10-CM | POA: Diagnosis present

## 2019-06-17 DIAGNOSIS — M545 Low back pain, unspecified: Secondary | ICD-10-CM

## 2019-06-17 DIAGNOSIS — M6281 Muscle weakness (generalized): Secondary | ICD-10-CM | POA: Insufficient documentation

## 2019-06-17 DIAGNOSIS — M546 Pain in thoracic spine: Secondary | ICD-10-CM | POA: Insufficient documentation

## 2019-06-17 NOTE — Therapy (Signed)
[facility] [address], [place], [address] Phone: [phone]   Fax:  [phone]  Physical Therapy Treatment  Patient Details  Name: KYRIAH HUIBREGTSE MRN: [id number] Date of Birth: [birth year] Referring Provider (PT): Eugene Garnet, MD   Encounter Date: 06/17/2019  PT End of Session - 06/17/19 1310    Visit Number  6    Number of Visits  24    Date for PT Re-Evaluation  08/21/19    Authorization Type  Primary Medicare; secondary BCBS reporting from 05/29/2019    Authorization Time Period  Current certification: [id number] to 08/21/2019    Authorization - Visit Number  6    Authorization - Number of Visits  10    PT Start Time  1302    PT Stop Time  1345    PT Time Calculation (min)  43 min    Equipment Utilized During Treatment  Gait belt    Activity Tolerance  Patient limited by pain;Patient tolerated treatment well;Patient limited by fatigue    Behavior During Therapy  [facility] for tasks assessed/performed       Past Medical History:  Diagnosis Date  . A-fib ([place]) 2016  . Acid reflux   . Anxiety   . Bowel obstruction ([place])   . CHF (congestive heart failure) ([place]) 2017  . Chronic kidney disease (CKD) stage G3a/A1, moderately decreased glomerular filtration rate (GFR) between 45-59 mL/min/1.73 square meter and albuminuria creatinine ratio less than 30 mg/g   . Diabetes mellitus without complication ([place])   . Dyspnea   . Fatty liver   . GI bleed   . Gout   . Hypertension   . Morbid obesity ([place])   . Osteoarthritis   . Osteoporosis   . Paroxysmal atrial fibrillation (HCC)   . Presence of permanent cardiac pacemaker   . Sinus node dysfunction ([place]) 08/24/2017  . TIA (transient ischemic attack)     Past Surgical History:  Procedure Laterality Date  . ABDOMINAL HYSTERECTOMY  1980  . APPENDECTOMY    . BACK SURGERY     2008  . BACK SURGERY  2019   patient describes kyphoplasty for compression  fractures, MD referral said fusion  . BREAST BIOPSY Right 08/16   stereo fibroadenomatous change, neg for atypia  . BREAST EXCISIONAL BIOPSY Left    neg  . CARDIAC CATHETERIZATION Left 08/25/2016   Procedure: Left Heart Cath and Coronary Angiography;  Surgeon: Corey Skains, MD;  Location: [place] [facility];  Service: Cardiovascular;  Laterality: Left;  . CARDIOVERSION N/A 06/28/2017   Procedure: CARDIOVERSION;  Surgeon: Corey Skains, MD;  Location: [facility];  Service: Cardiovascular;  Laterality: N/A;  . CARDIOVERSION N/A 02/06/2018   Procedure: CARDIOVERSION;  Surgeon: Josue Hector, MD;  Location: [place];  Service: Cardiovascular;  Laterality: N/A;  . CATARACT EXTRACTION, BILATERAL  2012  . CHOLECYSTECTOMY    . colon blockage  1999  . COLON SURGERY  1999  . COLONOSCOPY  2016   polyps removed 2016  . DORSAL COMPARTMENT RELEASE Left 09/14/2016   Procedure: RELEASE DORSAL COMPARTMENT (DEQUERVAIN);  Surgeon: Dereck Leep, MD;  Location: [facility];  Service: Orthopedics;  Laterality: Left;  . ESOPHAGOGASTRODUODENOSCOPY N/A 01/27/2015   Procedure: ESOPHAGOGASTRODUODENOSCOPY (EGD);  Surgeon: Lollie Sails, MD;  Location: [facility];  Service: Endoscopy;  Laterality: N/A;  . ESOPHAGOGASTRODUODENOSCOPY (EGD) WITH PROPOFOL N/A 07/24/2015   Procedure: ESOPHAGOGASTRODUODENOSCOPY (EGD) WITH PROPOFOL;  Surgeon: Lucilla Lame, MD;  Location: [facility];  Service: Endoscopy;  Laterality: N/A;  . ESOPHAGOGASTRODUODENOSCOPY (EGD) WITH PROPOFOL N/A 04/12/2018   Procedure: ESOPHAGOGASTRODUODENOSCOPY (EGD) WITH PROPOFOL;  Surgeon: Jonathon Bellows, MD;  Location: [facility] END[facility]COPY;  Service: Gastroenterology;  Laterality: N/A;  . ESOPHAGOGASTRODUODENOSCOPY (EGD) WITH PROPOFOL N/A 06/22/2018   Procedure: ESOPHAGOGASTRODUODENOSCOPY (EGD) WITH PROPOFOL;  Surgeon: [name], MD;  Location: [facility];  Service: Endoscopy;  Laterality: N/A;  . ESOPHAGOGASTRODUODENOSCOPY (EGD) WITH  PROPOFOL N/A 07/09/2018   Procedure: ESOPHAGOGASTRODUODENOSCOPY (EGD) WITH PROPOFOL with resection of gastric polyps;  Surgeon: Jonathon Bellows, MD;  Location: [facility] ENDOSCOPY;  Service: Gastroenterology;  Laterality: N/A;  . ESOPHAGOGASTRODUODENOSCOPY (EGD) WITH PROPOFOL N/A 05/17/2019   Procedure: ESOPHAGOGASTRODUODENOSCOPY (EGD) WITH PROPOFOL;  Surgeon: Jonathon Bellows, MD;  Location: [facility];  Service: Gastroenterology;  Laterality: N/A;  . EUS N/A 06/22/2018   Procedure: UPPER ENDOSCOPIC ULTRASOUND (EUS) RADIAL;  Surgeon: [name], MD;  Location: [facility] ENDOSCOPY;  Service: Endoscopy;  Laterality: N/A;  . JOINT REPLACEMENT  2014   Bilateral Knee replacement  . LEAD REVISION/REPAIR N/A 08/29/2017   Procedure: LEAD REVISION/REPAIR;  Surgeon: Constance Haw, MD;  Location: [place] [facility];  Service: Cardiovascular;  Laterality: N/A;  . OOPHORECTOMY    . PACEMAKER INSERTION Left 07/01/2015   Procedure: INSERTION PACEMAKER;  Surgeon: Isaias Cowman, MD;  Location: [facility];  Service: Cardiovascular;  Laterality: Left;  . PACEMAKER REVISION N/A 08/28/2017   Procedure: PACEMAKER REVISION;  Surgeon: Deboraha Sprang, MD;  Location: [place] [facility];  Service: Cardiovascular;  Laterality: N/A;  . REPLACEMENT TOTAL KNEE BILATERAL    . SHOULDER ARTHROSCOPY WITH SUBACROMIAL DECOMPRESSION Left 2013  . TEE WITHOUT CARDIOVERSION N/A 06/28/2017   Procedure: TRANSESOPHAGEAL ECHOCARDIOGRAM (TEE);  Surgeon: Corey Skains, MD;  Location: [facility];  Service: Cardiovascular;  Laterality: N/A;    There were no vitals filed for this visit.  Subjective Assessment - 06/17/19 1309    Subjective  Patient reports uncomfortable at her upper thoracics without pain; lower back pain rated as 8/10 when bending. Weakness at B UEs and shortness of breath feeling since this morning and no chest pain or L arm pain.    Patient is accompained by:  Family member   [name]   Pertinent History  Patient  is a 78 y.o. female who presents to outpatient physical therapy with a referral for secondary kyphosis of thoracic region. This patient's chief complaints consist of pain, inability to stand up straight, poor activity tolerance following recently rib fracture at 7th rib at R 04/02/2019. Patient also has a fall history that being dropped at rehab facility leading to 2 compression fractures that required kyphoplasty in late 2019. The above history leading to the following functional deficits: difficulty with ADLs, IADLs, bed mobility, transfers, household and community mobility, social and community participation.  Relevant past medical history and comorbidities include SOB with exertion; shingles (2020), chronic kidney disease (between stage III-IV according to pt), diabetes (last a1c around 7), stomach polyp that was recently surgically repaired, obesity, Hx of B rotator cuff tears (with repairs), history of 2 strokes, pacemaker with revision(properly functioning per pt 05/29/2019), achy joints, bilateral knee replacement, gout, dypsnea, acid reflux, CHF, GI bleed, hypertension, afib w/history of cardioversion, osteoporosis, repeated hospitalization with decreased endurance and functional mobility, wide range of allergies. Recent surgical procedures (in 2019): cardioversion, esophagogastroduendoscopy with propofol (x2), back surgery (pt describes kyphoplasty at 2 levels, and she mentioned prior fusion), eye surgery, diabetes, no seizures. See chart for extensive past  medical history.    Limitations  Sitting;Lifting;Standing;Walking;House hold activities    How long can you sit comfortably?  30 min    How long can you stand comfortably?  1-1.5 minutes    How long can you walk comfortably?  less than 20 feet    Diagnostic tests  04/02/2019 CI chest IMPRESSION: Mildly displaced right seventh rib fracture. Scheduled bone density exam on 06/07/2019    Patient Stated Goals  Walking longer distance, able to play with  her grandchildren as well a reduce of her back pain.    Currently in Pain?  Yes    Pain Score  8     Pain Location  Back    Pain Orientation  Right;Left    Pain Onset  More than a month ago       Blood sugar: patient self-reported 217 mmol/L before session.   TREATMENT:  Pacemaker   Therapeutic exercise:to centralize symptoms and improve ROM, strength, muscular endurance, and activity tolerance required for successful completion of functional activities. -NuSteplevel3withusing bilateral upperand lower extremities. (last 4 minutes using BLEs only)Setting9. For improved extremity mobility, muscular endurance, and activity tolerance; and to induce the analgesic effect of aerobic exercise, stimulate improved joint nutrition, and prepare body structures and systems for following interventions.37minutes.80spm, 0.56 mile.Cuing to improve SPM up to and soft end control with stepping. - Sit to Stand 2 x10 cuing to perform without BUEs; CGA to MinA.  - Deep neck flexor training 3 x 10 reps with extensive cuing for proper forms and posture.  - Seatedthoracic extension withBUE horizontal abduction(elbow bend) 3x10 3s hold. Holding tennis balls at each hand. Cuing to sitting on the edge of the chair without using back rest.  - seated shoulder abduction with 2lb dumbbell at each hand. 2 x 10 reps. Back unsupported to improve trunk endurance.  - Additional cuing (less compare to previous session) provide to proper sitting posture without using back rest and TA activation to improve stability at lower back and pain control.  - Extra resting time between exercise provided due to patient feeling tired and slight SOB today. No report of chest pain or dizziness.    HOME EXERCISE PROGRAM Access Code: [email or web address]  URL: [email or web address]  Date: 06/03/2019  Prepared by: Rosita Kea   Exercises  Neck Sidebending - 5 reps - 30 seconds hold - 2x daily - 7x weekly   Standing Cervical Rotation AROM - 5 reps - 30 seconds hold - 2x daily - 7x weekly  Seated Shoulder Flexion - 3 sets - 10 reps - 2x daily - 7x weekly  Seated Thoracic Lumbar Extension - 3 sets - 10 reps - 2x daily - 7x weekly  Seated Shoulder Row with Anchored Resistance - 3 sets - 10 reps - 2x daily - 7x weekly   Patient tolerated the session well today and no increase of pain at her lower back at the end. Patient demonstrated improve posture and muscle activation pattern with UEs exercises (relaxed upper traps). Patient also presented positive attitude towards her rehabilitation process with physical therapy. Patient used Rolator as her assistive device which is a great improvement from using wheelchair as previous sessions. Will continue progressing muscle strengthening/endurance, ROM in later session.The pt will benefit from skilled PT services to address deficits and return to PLOF function, independence at home, recreational activity and housework.     PT Education - 06/17/19 1310    Education Details  Exercise purpose/form.    Person(s) Educated  Patient  Methods  Explanation;Demonstration;Tactile cues;Verbal cues    Comprehension  Verbalized understanding;Returned demonstration;Verbal cues required;Tactile cues required       PT Short Term Goals - 05/29/19 1531      PT SHORT TERM GOAL #1   Title  Be independent with intial home exercise program completed at least 3 times per week for self-management of symptoms.    Baseline  initial HEP provided at IE (09/16);    Time  4    Period  Weeks    Status  New    Target Date  06/26/19        PT Long Term Goals - 05/30/19 [id number]      PT LONG TERM GOAL #1   Title  Be independent with a long-term home exercise program for self-management of symptoms.    Baseline  initial HEP provided at IE (05/29/2019);    Time  12    Period  Weeks    Status  New    Target Date  08/21/19      PT LONG TERM GOAL #2   Title  Patient will be  able to ambulate > 200 feet mod I with LRAD for improved community mobility    Baseline  20 feet (05/29/2019);    Time  8    Period  Weeks    Status  New    Target Date  07/24/19      PT LONG TERM GOAL #3   Title  Reduce pain with functional activities to equal or less than 3/10 to allow patient to be more independent with usual activities including ADLs, IADLs, and social engagement with less difficulty.     Baseline  10/10 (05/29/2019);    Time  8    Period  Weeks    Status  New    Target Date  07/24/19      PT LONG TERM GOAL #4   Title  Be able to complete 5 sit to stand test in 20 seconds to improve functional mobility for transfers and community participation.    Baseline  37.5s for 5 STS (05/29/2019);    Time  8    Period  Weeks    Status  New    Target Date  07/24/19      PT LONG TERM GOAL #5   Title  Patient will be able to complete 10 feet TUG within 40s to improve fuctional mobility for transfers and community participation.    Baseline  63s (05/29/2019);    Time  12    Period  Weeks    Status  New    Target Date  08/21/19      Additional Long Term Goals   Additional Long Term Goals  Yes      PT LONG TERM GOAL #6   Title  Demonstrate improved FOTO score by 10 units to demonstrate improvement in overall condition and self-reported functional ability.    Baseline  FOTO 30; MCD 10 (05/29/2019);    Time  12    Period  Weeks    Status  New    Target Date  08/21/19            Plan - 06/17/19 1916    Clinical Impression Statement  Patient tolerated the session well today and no increase of pain at her lower back at the end. Patient demonstrated improve posture and muscle activation pattern with UEs exercises (relaxed upper traps). Patient also presented positive attitude towards her rehabilitation process with physical  therapy. Patient used Rollator as her assistive device which is a great improvement from using wheelchair as previous sessions. Will continue  progressing muscle strengthening/endurance, ROM in later session. The pt will benefit from skilled PT services to address deficits and return to PLOF function, independence at home, recreational activity and housework.    Personal Factors and Comorbidities  Age;Time since onset of injury/illness/exacerbation;Comorbidity 3+;Fitness    Comorbidities  SOB with exertion; shingles (2020), chronic kidney disease (between stage III-IV according to pt), diabetes (last a1c around 7), stomach polyp that was recently surgically repaired, obesity, Hx of B rotator cuff tears (with repairs), history of 2 strokes, pacemaker with revision(properly functioning per pt 05/29/2019), achy joints, bilateral knee replacement, gout, dypsnea, acid reflux, CHF, GI bleed, hypertension, afib w/history of cardioversion, osteoporosis, repeated hospitalization with decreased endurance and functional mobility, wide range of allergies. Recent surgical procedures (in 2019): cardioversion, esophagogastroduendoscopy with propofol (x2), back surgery (pt describes kyphoplasty at 2 levels, and she mentioned prior fusion), eye surgery, diabetes, no seizures. See chart for extensive past medical history.    Examination-Activity Limitations  Dressing;Sleep;Sit;Carry;Bathing;Squat;Bed Mobility;Hygiene/Grooming;Lift;Stairs;Bend;Stand;Caring for Others;Reach Overhead;Transfers;Locomotion Level;Toileting;Continence    Examination-Participation Restrictions  Cleaning;Shop;Laundry;Community Activity;Meal Prep;Yard Work;Driving;Interpersonal Relationship    Stability/Clinical Decision Making  Evolving/Moderate complexity    Rehab Potential  Fair    PT Frequency  2x / week    PT Duration  12 weeks    PT Treatment/Interventions  Cryotherapy;ADLs/Self Care Home Management;Moist Heat;Gait training;Stair training;Functional mobility training;Therapeutic activities;Therapeutic exercise;Balance training;Neuromuscular re-education;Wheelchair mobility  training;Patient/family education;Passive range of motion;Manual techniques;Dry needling;Energy conservation;Aquatic Therapy;Other (comment)   Joint mobilizations grades I-IV   PT Next Visit Plan  UE/LE strengthening, ROM, trunk extensor strengthening    PT Home Exercise Plan  Medbridge: [id number] and Agree with Plan of Care  Patient       Patient will benefit from skilled therapeutic intervention in order to improve the following deficits and impairments:  Abnormal gait, Decreased mobility, Decreased coordination, Increased muscle spasms, Postural dysfunction, Pain, Improper body mechanics, Decreased activity tolerance, Decreased endurance, Decreased range of motion, Decreased strength, Hypomobility, Impaired perceived functional ability, Impaired UE functional use, Decreased balance, Difficulty walking, Decreased safety awareness, Impaired flexibility, Obesity  Visit Diagnosis: Chronic midline low back pain without sciatica  Pain in thoracic spine  Difficulty in walking, not elsewhere classified  Muscle weakness (generalized)  Acute pain of left shoulder     Problem List Patient Active Problem List   Diagnosis Date Noted  . RUQ pain   . UTI (urinary tract infection) 06/14/2018  . Type 2 diabetes mellitus with neurological complications ([place]) 99991111  . Acute respiratory failure ([place]) 04/09/2018  . Chronic anticoagulation 04/09/2018  . Pacemaker 04/09/2018  . H/O TIA (transient ischemic attack) and stroke 04/09/2018  . CAD (coronary artery disease) 04/09/2018  . Hematemesis 04/05/2018  . Hypotension 02/04/2018  . Atrial fibrillation with RVR ([place]) 02/04/2018  . Atrial flutter ([place]) 08/24/2017  . Sinus node dysfunction ([place]) 08/24/2017  . Dizziness 06/12/2017  . Mastalgia 02/14/2017  . Acute on chronic renal insufficiency 01/09/2017  . Fatty liver 01/09/2017  . Benign essential HTN 09/08/2016  . Nephrolithiasis 02/22/2016  . Diabetic neuropathy with  neurologic complication ([place]) [id number]  . Moderate episode of recurrent major depressive disorder ([place]) 08/11/2015  . Gastritis without bleeding   . Gastric polyp   . Gastro-esophageal reflux disease without esophagitis   . PAF (paroxysmal atrial fibrillation) ([place]) 06/29/2015  . Intestinal obstruction ([place]) 02/20/2015  . DDD (degenerative disc  disease), lumbar 01/19/2015  . Back pain 01/12/2015  . Gout 01/12/2015  . Complete tear of left rotator cuff 10/16/2014  . Intervertebral disc disorder with radiculopathy of lumbar region 09/17/2014  . Lumbar radiculitis 09/17/2014  . Lumbar stenosis with neurogenic claudication 09/17/2014  . Neuritis or radiculitis due to rupture of lumbar intervertebral disc 09/17/2014  . Atherosclerosis of both carotid arteries 07/15/2014  . Essential hypertension 07/15/2014  . Hyperlipemia, mixed 07/15/2014  . 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 Body mass index (BMI) of 40.0-44.9 in adult ([place]) 06/21/2014  . Mixed incontinence 06/25/2013  . Mixed urge and stress incontinence 06/25/2013  . Nocturia 06/25/2013  . Blurring of visual image 04/11/2012    .Sherrilyn Rist, SPT 06/17/19, 7:29 PM  Everlean Alstrom. Graylon Good, PT, DPT 06/17/19, 7:29 PM   [facility] [address] [place], [address] Phone: [phone]   Fax:  [phone]  Name: HIMANI WHITTY MRN: [id number] Date of Birth: 26-May-1941

## 2019-06-17 NOTE — Telephone Encounter (Signed)
Please advise if OK to continue to refill non cardiac medication. Thank you!

## 2019-06-18 NOTE — Telephone Encounter (Signed)
[name]- ok to refill or PCP?

## 2019-06-19 ENCOUNTER — Other Ambulatory Visit: Payer: Self-pay

## 2019-06-19 ENCOUNTER — Encounter: Payer: Self-pay | Admitting: Physical Therapy

## 2019-06-19 ENCOUNTER — Ambulatory Visit: Payer: Medicare Other | Admitting: Physical Therapy

## 2019-06-19 ENCOUNTER — Other Ambulatory Visit: Payer: Medicare Other

## 2019-06-19 DIAGNOSIS — G8929 Other chronic pain: Secondary | ICD-10-CM

## 2019-06-19 DIAGNOSIS — M6281 Muscle weakness (generalized): Secondary | ICD-10-CM

## 2019-06-19 DIAGNOSIS — R262 Difficulty in walking, not elsewhere classified: Secondary | ICD-10-CM

## 2019-06-19 DIAGNOSIS — M546 Pain in thoracic spine: Secondary | ICD-10-CM

## 2019-06-19 DIAGNOSIS — M545 Low back pain: Secondary | ICD-10-CM | POA: Diagnosis not present

## 2019-06-19 DIAGNOSIS — M25512 Pain in left shoulder: Secondary | ICD-10-CM

## 2019-06-19 NOTE — Telephone Encounter (Signed)
RX not approved- needs to be filled by her PCP.

## 2019-06-19 NOTE — Therapy (Signed)
[facility] [address], [place], [address] Phone: [phone]   Fax:  [phone]  Physical Therapy Treatment  Patient Details  Name: Deborah Obrien MRN: [id number] Date of Birth: [birth year] Referring Provider (PT): Eugene Garnet, MD   Encounter Date: 06/19/2019  PT End of Session - 06/19/19 1514    Visit Number  7    Number of Visits  24    Date for PT Re-Evaluation  08/21/19    Authorization Type  Primary Medicare; secondary BCBS reporting from 05/29/2019    Authorization Time Period  Current certification: [id number] to 08/21/2019    Authorization - Visit Number  7    Authorization - Number of Visits  10    PT Start Time  1302    PT Stop Time  1352    PT Time Calculation (min)  50 min    Equipment Utilized During Treatment  --    Activity Tolerance  Patient limited by pain;Patient tolerated treatment well;Patient limited by fatigue    Behavior During Therapy  [facility] for tasks assessed/performed       Past Medical History:  Diagnosis Date  . A-fib ([place]) 2016  . Acid reflux   . Anxiety   . Bowel obstruction ([place])   . CHF (congestive heart failure) ([place]) 2017  . Chronic kidney disease (CKD) stage G3a/A1, moderately decreased glomerular filtration rate (GFR) between 45-59 mL/min/1.73 square meter and albuminuria creatinine ratio less than 30 mg/g   . Diabetes mellitus without complication ([place])   . Dyspnea   . Fatty liver   . GI bleed   . Gout   . Hypertension   . Morbid obesity ([place])   . Osteoarthritis   . Osteoporosis   . Paroxysmal atrial fibrillation (HCC)   . Presence of permanent cardiac pacemaker   . Sinus node dysfunction ([place]) 08/24/2017  . TIA (transient ischemic attack)     Past Surgical History:  Procedure Laterality Date  . ABDOMINAL HYSTERECTOMY  1980  . APPENDECTOMY    . BACK SURGERY     2008  . BACK SURGERY  2019   patient describes kyphoplasty for compression  fractures, MD referral said fusion  . BREAST BIOPSY Right 08/16   stereo fibroadenomatous change, neg for atypia  . BREAST EXCISIONAL BIOPSY Left    neg  . CARDIAC CATHETERIZATION Left 08/25/2016   Procedure: Left Heart Cath and Coronary Angiography;  Surgeon: Corey Skains, MD;  Location: [place] [facility];  Service: Cardiovascular;  Laterality: Left;  . CARDIOVERSION N/A 06/28/2017   Procedure: CARDIOVERSION;  Surgeon: Corey Skains, MD;  Location: [facility];  Service: Cardiovascular;  Laterality: N/A;  . CARDIOVERSION N/A 02/06/2018   Procedure: CARDIOVERSION;  Surgeon: Josue Hector, MD;  Location: [place];  Service: Cardiovascular;  Laterality: N/A;  . CATARACT EXTRACTION, BILATERAL  2012  . CHOLECYSTECTOMY    . colon blockage  1999  . COLON SURGERY  1999  . COLONOSCOPY  2016   polyps removed 2016  . DORSAL COMPARTMENT RELEASE Left 09/14/2016   Procedure: RELEASE DORSAL COMPARTMENT (DEQUERVAIN);  Surgeon: Dereck Leep, MD;  Location: [facility];  Service: Orthopedics;  Laterality: Left;  . ESOPHAGOGASTRODUODENOSCOPY N/A 01/27/2015   Procedure: ESOPHAGOGASTRODUODENOSCOPY (EGD);  Surgeon: Lollie Sails, MD;  Location: [facility];  Service: Endoscopy;  Laterality: N/A;  . ESOPHAGOGASTRODUODENOSCOPY (EGD) WITH PROPOFOL N/A 07/24/2015   Procedure: ESOPHAGOGASTRODUODENOSCOPY (EGD) WITH PROPOFOL;  Surgeon: Lucilla Lame, MD;  Location: [facility] ENDOSCOPY;  Service: Endoscopy;  Laterality: N/A;  . ESOPHAGOGASTRODUODENOSCOPY (EGD) WITH PROPOFOL N/A 04/12/2018   Procedure: ESOPHAGOGASTRODUODENOSCOPY (EGD) WITH PROPOFOL;  Surgeon: Jonathon Bellows, MD;  Location: [facility]COPY;  Service: Gastroenterology;  Laterality: N/A;  . ESOPHAGOGASTRODUODENOSCOPY (EGD) WITH PROPOFOL N/A 06/22/2018   Procedure: ESOPHAGOGASTRODUODENOSCOPY (EGD) WITH PROPOFOL;  Surgeon: [name], MD;  Location: [facility] ENDOSCOPY;  Service: Endoscopy;  Laterality: N/A;  . ESOPHAGOGASTRODUODENOSCOPY (EGD) WITH  PROPOFOL N/A 07/09/2018   Procedure: ESOPHAGOGASTRODUODENOSCOPY (EGD) WITH PROPOFOL with resection of gastric polyps;  Surgeon: Jonathon Bellows, MD;  Location: [facility] ENDOSCOPY;  Service: Gastroenterology;  Laterality: N/A;  . ESOPHAGOGASTRODUODENOSCOPY (EGD) WITH PROPOFOL N/A 05/17/2019   Procedure: ESOPHAGOGASTRODUODENOSCOPY (EGD) WITH PROPOFOL;  Surgeon: Jonathon Bellows, MD;  Location: [facility] ENDOSCOPY;  Service: Gastroenterology;  Laterality: N/A;  . EUS N/A 06/22/2018   Procedure: UPPER ENDOSCOPIC ULTRASOUND (EUS) RADIAL;  Surgeon: [name], MD;  Location: [facility] ENDOSCOPY;  Service: Endoscopy;  Laterality: N/A;  . JOINT REPLACEMENT  2014   Bilateral Knee replacement  . LEAD REVISION/REPAIR N/A 08/29/2017   Procedure: LEAD REVISION/REPAIR;  Surgeon: Constance Haw, MD;  Location: [place] [facility];  Service: Cardiovascular;  Laterality: N/A;  . OOPHORECTOMY    . PACEMAKER INSERTION Left 07/01/2015   Procedure: INSERTION PACEMAKER;  Surgeon: Isaias Cowman, MD;  Location: [facility];  Service: Cardiovascular;  Laterality: Left;  . PACEMAKER REVISION N/A 08/28/2017   Procedure: PACEMAKER REVISION;  Surgeon: Deboraha Sprang, MD;  Location: [place] [facility];  Service: Cardiovascular;  Laterality: N/A;  . REPLACEMENT TOTAL KNEE BILATERAL    . SHOULDER ARTHROSCOPY WITH SUBACROMIAL DECOMPRESSION Left 2013  . TEE WITHOUT CARDIOVERSION N/A 06/28/2017   Procedure: TRANSESOPHAGEAL ECHOCARDIOGRAM (TEE);  Surgeon: Corey Skains, MD;  Location: [facility];  Service: Cardiovascular;  Laterality: N/A;    There were no vitals filed for this visit.  Subjective Assessment - 06/19/19 1325    Subjective  Patient reports SOB, feeling tired, and increase of L shoulder arm pain since last visit. Patient also states she has numbess and tinglings at her L middle, ring and little finger at palm side. Patient also states she visited her kidney doctor yesterday and her kidney function has been gradually trending  down. Patient was really upset about this news. She also said her BP yesterday was 100/50 mmHg which is lower than her normal and therefore she is not feeling well today.    Patient is accompained by:  Family member   [name]   Pertinent History  Patient is a 78 y.o. female who presents to outpatient physical therapy with a referral for secondary kyphosis of thoracic region. This patient's chief complaints consist of pain, inability to stand up straight, poor activity tolerance following recently rib fracture at 7th rib at R 04/02/2019. Patient also has a fall history that being dropped at rehab facility leading to 2 compression fractures that required kyphoplasty in late 2019. The above history leading to the following functional deficits: difficulty with ADLs, IADLs, bed mobility, transfers, household and community mobility, social and community participation.  Relevant past medical history and comorbidities include SOB with exertion; shingles (2020), chronic kidney disease (between stage III-IV according to pt), diabetes (last a1c around 7), stomach polyp that was recently surgically repaired, obesity, Hx of B rotator cuff tears (with repairs), history of 2 strokes, pacemaker with revision(properly functioning per pt 05/29/2019), achy joints, bilateral knee replacement, gout, dypsnea, acid reflux, CHF, GI bleed, hypertension, afib w/history of cardioversion, osteoporosis,  repeated hospitalization with decreased endurance and functional mobility, wide range of allergies. Recent surgical procedures (in 2019): cardioversion, esophagogastroduendoscopy with propofol (x2), back surgery (pt describes kyphoplasty at 2 levels, and she mentioned prior fusion), eye surgery, diabetes, no seizures. See chart for extensive past medical history.    Limitations  Sitting;Lifting;Standing;Walking;House hold activities    How long can you sit comfortably?  30 min    How long can you stand comfortably?  1-1.5 minutes     How long can you walk comfortably?  less than 20 feet    Diagnostic tests  04/02/2019 CI chest IMPRESSION: Mildly displaced right seventh rib fracture. Scheduled bone density exam on 06/07/2019    Patient Stated Goals  Walking longer distance, able to play with her grandchildren as well a reduce of her back pain.    Currently in Pain?  Yes    Pain Score  2     Pain Location  Back    Pain Orientation  Upper    Pain Descriptors / Indicators  Aching    Pain Onset  More than a month ago        Blood glucose: patient self-reported 175 mmol/L before session.   TREATMENT:  Pacemaker  Therapeutic exercise:to centralize symptoms and improve ROM, strength, muscular endurance, and activity tolerance required for successful completion of functional activities. -NuSteplevel3withusing bilateral upperand lower extremities. (last 4 minutes using BLEs only)Setting9. For improved extremity mobility, muscular endurance, and activity tolerance; and to induce the analgesic effect of aerobic exercise, stimulate improved joint nutrition, and prepare body structures and systems for following interventions.56minutes.75spm, 0.55 mile.Cuing to improve SPM up to and soft end control with stepping. - Extra time spent on vital measurement (blood pressure: machine 118/63mmhg; manual 120/64 mmhg, and SpO2: 95%; HR: 69 beat per minute) - Deep neck flexor training 2 x 10 reps with less cuing for proper forms and posture.  - Seated with back rest shoulder flexon 2x10 reps with 1 lb weight on each hand.  - Seated neck flexion/side flexion/extension x 10 reps each direction.  - Ambulation 30 feet from clinic on half way to her car, with CGA and patient requires sitting on her recliner and asked therapist assist pushing her back to her car due to pain at her back, difficulty walking and emotional mental status(crying).  - Patient was educated on diagnosis, anatomy and pathology involved, prognosis, role of  PT, demonstrating exercise with proper form following verbal and tactile cues. Pt was educated on and agreed to plan of care.   Manual therapy: to reduce pain and tissue tension, improve range of motion, neuromodulation, in order to promote improved ability to complete functional activities. - STM at L lower back for pain control and muscle relaxation to continue prescribed exercises.    HOME EXERCISE PROGRAM Access Code: [email or web address]  URL: [email or web address]  Date: 06/03/2019  Prepared by: Rosita Kea   Exercises  Neck Sidebending - 5 reps - 30 seconds hold - 2x daily - 7x weekly  Standing Cervical Rotation AROM - 5 reps - 30 seconds hold - 2x daily - 7x weekly  Seated Shoulder Flexion - 3 sets - 10 reps - 2x daily - 7x weekly  Seated Thoracic Lumbar Extension - 3 sets - 10 reps - 2x daily - 7x weekly  Seated Shoulder Row with Anchored Resistance - 3 sets - 10 reps - 2x daily - 7x weekly   Patient tolerated the session fair today and had extra time spending on education and calming  patient down due to multiple emotional (crying) events. Patient demonstrated improved posture and less cuing required for cervical and shoulder muscles. Patient performed exercises today with less weights due to back and shoulder pain and numbness. Patient expressed deep concerns about her kidney functional decline and she also states that she might had a panic attack when she cried previously. When patient left the clinic, patient started had difficulty walking and requires sitting and max assist from therapist pushing her rollator back to her car. Patient started crying when patient started talking to her husband which was waiting in the car to take her home. Patient left in the car safe. Patient's tolerance to today's therapy impacted by her emotional states and fatigue level, but patient is still progressed and maintained her therapy gain with less cuing. Will continue progressing muscle  strengthening/endurance, ROM in later session. The pt will benefit from skilled PT services to address deficits and return to PLOF function, independence at home, recreational activity and housework       PT Education - 06/19/19 1514    Education Details  Exercise purpose/form.    Person(s) Educated  Patient    Methods  Explanation;Demonstration;Tactile cues;Verbal cues    Comprehension  Verbalized understanding;Returned demonstration;Verbal cues required;Tactile cues required       PT Short Term Goals - 05/29/19 1531      PT SHORT TERM GOAL #1   Title  Be independent with intial home exercise program completed at least 3 times per week for self-management of symptoms.    Baseline  initial HEP provided at IE (09/16);    Time  4    Period  Weeks    Status  New    Target Date  06/26/19        PT Long Term Goals - 05/30/19 [id number]      PT LONG TERM GOAL #1   Title  Be independent with a long-term home exercise program for self-management of symptoms.    Baseline  initial HEP provided at IE (05/29/2019);    Time  12    Period  Weeks    Status  New    Target Date  08/21/19      PT LONG TERM GOAL #2   Title  Patient will be able to ambulate > 200 feet mod I with LRAD for improved community mobility    Baseline  20 feet (05/29/2019);    Time  8    Period  Weeks    Status  New    Target Date  07/24/19      PT LONG TERM GOAL #3   Title  Reduce pain with functional activities to equal or less than 3/10 to allow patient to be more independent with usual activities including ADLs, IADLs, and social engagement with less difficulty.     Baseline  10/10 (05/29/2019);    Time  8    Period  Weeks    Status  New    Target Date  07/24/19      PT LONG TERM GOAL #4   Title  Be able to complete 5 sit to stand test in 20 seconds to improve functional mobility for transfers and community participation.    Baseline  37.5s for 5 STS (05/29/2019);    Time  8    Period  Weeks    Status   New    Target Date  07/24/19      PT LONG TERM GOAL #5   Title  Patient  will be able to complete 10 feet TUG within 40s to improve fuctional mobility for transfers and community participation.    Baseline  63s (05/29/2019);    Time  12    Period  Weeks    Status  New    Target Date  08/21/19      Additional Long Term Goals   Additional Long Term Goals  Yes      PT LONG TERM GOAL #6   Title  Demonstrate improved FOTO score by 10 units to demonstrate improvement in overall condition and self-reported functional ability.    Baseline  FOTO 30; MCD 10 (05/29/2019);    Time  12    Period  Weeks    Status  New    Target Date  08/21/19            Plan - 06/19/19 1541    Clinical Impression Statement  Patient tolerated the session fair today and had extra time spending on education and calming patient down due to multiple emotional (crying) events. Patient demonstrated improved posture and less cuing required for cervical and shoulder muscles. Patient performed exercises today with less weights due to back and shoulder pain and numbness. Patient expressed deep concerns about her kidney functional decline and she also states that she might had a panic attack when she cried previously. When patient left the clinic, patient started had difficulty walking and requires sitting and max assist from therapist pushing her rollator back to her car. Patient started crying when patient started talking to her husband which was waiting in the car to take her home. Patient left in the car safe. Patient's tolerance to today's therapy impacted by her emotional states and fatigue level, but patient is still progressed and maintained her therapy gain with less cuing. Will continue progressing muscle strengthening/endurance, ROM in later session. The pt will benefit from skilled PT services to address deficits and return to PLOF function, independence at home, recreational activity and housework    Personal  Factors and Comorbidities  Age;Time since onset of injury/illness/exacerbation;Comorbidity 3+;Fitness    Comorbidities  SOB with exertion; shingles (2020), chronic kidney disease (between stage III-IV according to pt), diabetes (last a1c around 7), stomach polyp that was recently surgically repaired, obesity, Hx of B rotator cuff tears (with repairs), history of 2 strokes, pacemaker with revision(properly functioning per pt 05/29/2019), achy joints, bilateral knee replacement, gout, dypsnea, acid reflux, CHF, GI bleed, hypertension, afib w/history of cardioversion, osteoporosis, repeated hospitalization with decreased endurance and functional mobility, wide range of allergies. Recent surgical procedures (in 2019): cardioversion, esophagogastroduendoscopy with propofol (x2), back surgery (pt describes kyphoplasty at 2 levels, and she mentioned prior fusion), eye surgery, diabetes, no seizures. See chart for extensive past medical history.    Examination-Activity Limitations  Dressing;Sleep;Sit;Carry;Bathing;Squat;Bed Mobility;Hygiene/Grooming;Lift;Stairs;Bend;Stand;Caring for Others;Reach Overhead;Transfers;Locomotion Level;Toileting;Continence    Examination-Participation Restrictions  Cleaning;Shop;Laundry;Community Activity;Meal Prep;Yard Work;Driving;Interpersonal Relationship    Stability/Clinical Decision Making  Evolving/Moderate complexity    Rehab Potential  Fair    PT Frequency  2x / week    PT Duration  12 weeks    PT Treatment/Interventions  Cryotherapy;ADLs/Self Care Home Management;Moist Heat;Gait training;Stair training;Functional mobility training;Therapeutic activities;Therapeutic exercise;Balance training;Neuromuscular re-education;Wheelchair mobility training;Patient/family education;Passive range of motion;Manual techniques;Dry needling;Energy conservation;Aquatic Therapy;Other (comment)   Joint mobilizations grades I-IV   PT Next Visit Plan  UE/LE strengthening, ROM, trunk extensor  strengthening    PT Home Exercise Plan  Medbridge: [id number] and Agree with Plan of Care  Patient  Patient will benefit from skilled therapeutic intervention in order to improve the following deficits and impairments:  Abnormal gait, Decreased mobility, Decreased coordination, Increased muscle spasms, Postural dysfunction, Pain, Improper body mechanics, Decreased activity tolerance, Decreased endurance, Decreased range of motion, Decreased strength, Hypomobility, Impaired perceived functional ability, Impaired UE functional use, Decreased balance, Difficulty walking, Decreased safety awareness, Impaired flexibility, Obesity  Visit Diagnosis: Pain in thoracic spine  Difficulty in walking, not elsewhere classified  Chronic midline low back pain without sciatica  Muscle weakness (generalized)  Acute pain of left shoulder     Problem List Patient Active Problem List   Diagnosis Date Noted  . RUQ pain   . UTI (urinary tract infection) 06/14/2018  . Type 2 diabetes mellitus with neurological complications ([place]) 99991111  . Acute respiratory failure ([place]) 04/09/2018  . Chronic anticoagulation 04/09/2018  . Pacemaker 04/09/2018  . H/O TIA (transient ischemic attack) and stroke 04/09/2018  . CAD (coronary artery disease) 04/09/2018  . Hematemesis 04/05/2018  . Hypotension 02/04/2018  . Atrial fibrillation with RVR ([place]) 02/04/2018  . Atrial flutter ([place]) 08/24/2017  . Sinus node dysfunction ([place]) 08/24/2017  . Dizziness 06/12/2017  . Mastalgia 02/14/2017  . Acute on chronic renal insufficiency 01/09/2017  . Fatty liver 01/09/2017  . Benign essential HTN 09/08/2016  . Nephrolithiasis 02/22/2016  . Diabetic neuropathy with neurologic complication ([place]) [id number]  . Moderate episode of recurrent major depressive disorder ([place]) 08/11/2015  . Gastritis without bleeding   . Gastric polyp   . Gastro-esophageal reflux disease without esophagitis   . PAF (paroxysmal  atrial fibrillation) ([place]) 06/29/2015  . Intestinal obstruction ([place]) 02/20/2015  . DDD (degenerative disc disease), lumbar 01/19/2015  . Back pain 01/12/2015  . Gout 01/12/2015  . Complete tear of left rotator cuff 10/16/2014  . Intervertebral disc disorder with radiculopathy of lumbar region 09/17/2014  . Lumbar radiculitis 09/17/2014  . Lumbar stenosis with neurogenic claudication 09/17/2014  . Neuritis or radiculitis due to rupture of lumbar intervertebral disc 09/17/2014  . Atherosclerosis of both carotid arteries 07/15/2014  . Essential hypertension 07/15/2014  . Hyperlipemia, mixed 07/15/2014  . 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 Body mass index (BMI) of 40.0-44.9 in adult ([place]) 06/21/2014  . Mixed incontinence 06/25/2013  . Mixed urge and stress incontinence 06/25/2013  . Nocturia 06/25/2013  . Blurring of visual image 04/11/2012    Sherrilyn Rist, SPT 06/19/19, 5:15 PM  Everlean Alstrom. Graylon Good, PT, DPT 06/19/19, 5:15 PM  [facility] [address] [place], [address] Phone: [phone]   Fax:  [phone]  Name: Deborah Obrien MRN: [id number] Date of Birth: April 07, 1941

## 2019-06-19 NOTE — Telephone Encounter (Signed)
Prob should be primary care-=- we started it but they have been following it  [employer]

## 2019-06-20 ENCOUNTER — Other Ambulatory Visit: Payer: Self-pay | Admitting: Internal Medicine

## 2019-06-24 ENCOUNTER — Ambulatory Visit: Payer: Medicare Other | Admitting: Gastroenterology

## 2019-06-24 ENCOUNTER — Encounter: Payer: Medicare Other | Admitting: Physical Therapy

## 2019-06-25 ENCOUNTER — Encounter: Payer: Medicare Other | Admitting: Physical Therapy

## 2019-06-25 ENCOUNTER — Other Ambulatory Visit: Payer: Self-pay

## 2019-06-25 ENCOUNTER — Encounter: Payer: Self-pay | Admitting: Physical Therapy

## 2019-06-25 ENCOUNTER — Ambulatory Visit: Payer: Medicare Other | Admitting: Physical Therapy

## 2019-06-25 DIAGNOSIS — M546 Pain in thoracic spine: Secondary | ICD-10-CM

## 2019-06-25 DIAGNOSIS — M6281 Muscle weakness (generalized): Secondary | ICD-10-CM

## 2019-06-25 DIAGNOSIS — R262 Difficulty in walking, not elsewhere classified: Secondary | ICD-10-CM

## 2019-06-25 DIAGNOSIS — G8929 Other chronic pain: Secondary | ICD-10-CM

## 2019-06-25 DIAGNOSIS — M25512 Pain in left shoulder: Secondary | ICD-10-CM

## 2019-06-25 DIAGNOSIS — M545 Low back pain: Secondary | ICD-10-CM | POA: Diagnosis not present

## 2019-06-25 NOTE — Therapy (Signed)
[facility] [address] [place], [address] Phone: [phone]   Fax:  [phone]  Physical Therapy Treatment  Patient Details  Name: Deborah Obrien MRN: [id number] Date of Birth: 16-Nov-1940 Referring Provider (PT): Eugene Garnet, MD   Encounter Date: 06/25/2019  PT End of Session - 06/25/19 0943    Visit Number  8    Number of Visits  24    Date for PT Re-Evaluation  08/21/19    Authorization Type  Primary Medicare; secondary BCBS reporting from 05/29/2019    Authorization Time Period  Current certification: [id number] to 08/21/2019    Authorization - Visit Number  8    Authorization - Number of Visits  10    PT Start Time  0930    PT Stop Time  1015    PT Time Calculation (min)  45 min    Activity Tolerance  Patient limited by pain;Patient tolerated treatment well;Patient limited by fatigue    Behavior During Therapy  [facility] for tasks assessed/performed       Past Medical History:  Diagnosis Date  . A-fib ([place]) 2016  . Acid reflux   . Anxiety   . Bowel obstruction ([place])   . CHF (congestive heart failure) ([place]) 2017  . Chronic kidney disease (CKD) stage G3a/A1, moderately decreased glomerular filtration rate (GFR) between 45-59 mL/min/1.73 square meter and albuminuria creatinine ratio less than 30 mg/g   . Diabetes mellitus without complication ([place])   . Dyspnea   . Fatty liver   . GI bleed   . Gout   . Hypertension   . Morbid obesity ([place])   . Osteoarthritis   . Osteoporosis   . Paroxysmal atrial fibrillation (HCC)   . Presence of permanent cardiac pacemaker   . Sinus node dysfunction ([place]) 08/24/2017  . TIA (transient ischemic attack)     Past Surgical History:  Procedure Laterality Date  . ABDOMINAL HYSTERECTOMY  1980  . APPENDECTOMY    . BACK SURGERY     2008  . BACK SURGERY  2019   patient describes kyphoplasty for compression fractures, MD referral said fusion  . BREAST BIOPSY  Right 08/16   stereo fibroadenomatous change, neg for atypia  . BREAST EXCISIONAL BIOPSY Left    neg  . CARDIAC CATHETERIZATION Left 08/25/2016   Procedure: Left Heart Cath and Coronary Angiography;  Surgeon: Corey Skains, MD;  Location: [place] [facility];  Service: Cardiovascular;  Laterality: Left;  . CARDIOVERSION N/A 06/28/2017   Procedure: CARDIOVERSION;  Surgeon: Corey Skains, MD;  Location: [facility];  Service: Cardiovascular;  Laterality: N/A;  . CARDIOVERSION N/A 02/06/2018   Procedure: CARDIOVERSION;  Surgeon: Josue Hector, MD;  Location: [place];  Service: Cardiovascular;  Laterality: N/A;  . CATARACT EXTRACTION, BILATERAL  2012  . CHOLECYSTECTOMY    . colon blockage  1999  . COLON SURGERY  1999  . COLONOSCOPY  2016   polyps removed 2016  . DORSAL COMPARTMENT RELEASE Left 09/14/2016   Procedure: RELEASE DORSAL COMPARTMENT (DEQUERVAIN);  Surgeon: Dereck Leep, MD;  Location: [facility];  Service: Orthopedics;  Laterality: Left;  . ESOPHAGOGASTRODUODENOSCOPY N/A 01/27/2015   Procedure: ESOPHAGOGASTRODUODENOSCOPY (EGD);  Surgeon: Lollie Sails, MD;  Location: [facility];  Service: Endoscopy;  Laterality: N/A;  . ESOPHAGOGASTRODUODENOSCOPY (EGD) WITH PROPOFOL N/A 07/24/2015   Procedure: ESOPHAGOGASTRODUODENOSCOPY (EGD) WITH PROPOFOL;  Surgeon: Lucilla Lame, MD;  Location: [facility] ENDOSCOPY;  Service: Endoscopy;  Laterality: N/A;  .  ESOPHAGOGASTRODUODENOSCOPY (EGD) WITH PROPOFOL N/A 04/12/2018   Procedure: ESOPHAGOGASTRODUODENOSCOPY (EGD) WITH PROPOFOL;  Surgeon: Jonathon Bellows, MD;  Location: [facility]COPY;  Service: Gastroenterology;  Laterality: N/A;  . ESOPHAGOGASTRODUODENOSCOPY (EGD) WITH PROPOFOL N/A 06/22/2018   Procedure: ESOPHAGOGASTRODUODENOSCOPY (EGD) WITH PROPOFOL;  Surgeon: [name], MD;  Location: [facility] ENDOSCOPY;  Service: Endoscopy;  Laterality: N/A;  . ESOPHAGOGASTRODUODENOSCOPY (EGD) WITH PROPOFOL N/A 07/09/2018   Procedure:  ESOPHAGOGASTRODUODENOSCOPY (EGD) WITH PROPOFOL with resection of gastric polyps;  Surgeon: Jonathon Bellows, MD;  Location: [facility] ENDOSCOPY;  Service: Gastroenterology;  Laterality: N/A;  . ESOPHAGOGASTRODUODENOSCOPY (EGD) WITH PROPOFOL N/A 05/17/2019   Procedure: ESOPHAGOGASTRODUODENOSCOPY (EGD) WITH PROPOFOL;  Surgeon: Jonathon Bellows, MD;  Location: [facility] ENDOSCOPY;  Service: Gastroenterology;  Laterality: N/A;  . EUS N/A 06/22/2018   Procedure: UPPER ENDOSCOPIC ULTRASOUND (EUS) RADIAL;  Surgeon: [name], MD;  Location: [facility] ENDOSCOPY;  Service: Endoscopy;  Laterality: N/A;  . JOINT REPLACEMENT  2014   Bilateral Knee replacement  . LEAD REVISION/REPAIR N/A 08/29/2017   Procedure: LEAD REVISION/REPAIR;  Surgeon: Constance Haw, MD;  Location: [place] [facility];  Service: Cardiovascular;  Laterality: N/A;  . OOPHORECTOMY    . PACEMAKER INSERTION Left 07/01/2015   Procedure: INSERTION PACEMAKER;  Surgeon: Isaias Cowman, MD;  Location: [facility];  Service: Cardiovascular;  Laterality: Left;  . PACEMAKER REVISION N/A 08/28/2017   Procedure: PACEMAKER REVISION;  Surgeon: Deboraha Sprang, MD;  Location: [place] [facility];  Service: Cardiovascular;  Laterality: N/A;  . REPLACEMENT TOTAL KNEE BILATERAL    . SHOULDER ARTHROSCOPY WITH SUBACROMIAL DECOMPRESSION Left 2013  . TEE WITHOUT CARDIOVERSION N/A 06/28/2017   Procedure: TRANSESOPHAGEAL ECHOCARDIOGRAM (TEE);  Surgeon: Corey Skains, MD;  Location: [facility];  Service: Cardiovascular;  Laterality: N/A;    There were no vitals filed for this visit.  Subjective Assessment - 06/25/19 0929    Subjective  Patient reports she has some L shoulder pain and feeling like "popcorn" when she moves it. Her blood sugar is 203 mmol/L prior to the session because she ate some sweets last night and this is what she anticipated. Patient also states she has some difficulites with her eye doctors.    Patient is accompained by:  Family member   husband  Deborah Obrien   Pertinent History  Patient is a 78 y.o. female who presents to outpatient physical therapy with a referral for secondary kyphosis of thoracic region. This patient's chief complaints consist of pain, inability to stand up straight, poor activity tolerance following recently rib fracture at 7th rib at R 04/02/2019. Patient also has a fall history that being dropped at rehab facility leading to 2 compression fractures that required kyphoplasty in late 2019. The above history leading to the following functional deficits: difficulty with ADLs, IADLs, bed mobility, transfers, household and community mobility, social and community participation.  Relevant past medical history and comorbidities include SOB with exertion; shingles (2020), chronic kidney disease (between stage III-IV according to pt), diabetes (last a1c around 7), stomach polyp that was recently surgically repaired, obesity, Hx of B rotator cuff tears (with repairs), history of 2 strokes, pacemaker with revision(properly functioning per pt 05/29/2019), achy joints, bilateral knee replacement, gout, dypsnea, acid reflux, CHF, GI bleed, hypertension, afib w/history of cardioversion, osteoporosis, repeated hospitalization with decreased endurance and functional mobility, wide range of allergies. Recent surgical procedures (in 2019): cardioversion, esophagogastroduendoscopy with propofol (x2), back surgery (pt describes kyphoplasty at 2 levels, and she mentioned prior fusion), eye surgery, diabetes, no seizures. See chart for  extensive past medical history.    Limitations  Sitting;Lifting;Standing;Walking;House hold activities    How long can you sit comfortably?  30 min    How long can you stand comfortably?  1-1.5 minutes    How long can you walk comfortably?  less than 20 feet    Diagnostic tests  04/02/2019 CI chest IMPRESSION: Mildly displaced right seventh rib fracture. Scheduled bone density exam on 06/07/2019    Patient Stated Goals  Walking  longer distance, able to play with her grandchildren as well a reduce of her back pain.    Currently in Pain?  Yes    Pain Score  5     Pain Location  Shoulder    Pain Orientation  Left    Pain Descriptors / Indicators  Aching;Numbness;Tingling    Pain Onset  More than a month ago       Blood glucose: patient self-reported203 mmol/L this morning.   TREATMENT:  Pacemaker  Therapeutic exercise:to centralize symptoms and improve ROM, strength, muscular endurance, and activity tolerance required for successful completion of functional activities. -NuSteplevel3withusing bilateral lower extremities.(last 4 minutes using BLEs only)Setting9. For improved extremity mobility, muscular endurance, and activity tolerance; and to induce the analgesic effect of aerobic exercise, stimulate improved joint nutrition, and prepare body structures and systems for followinginterventions.16minutes.60spm, 0. 75mile. - Seated march with LAQ 3x10 reps with 2lb ankle weight in last two set - Seated hip abduction/adduction 3x10 reps with 2lb ankle weight. - seated marching 90mins with 2lb ankle weight  - PROM in flexion, abduction, protraction with L UEs. Painful.  - Patient was educated on diagnosis, anatomy and pathology involved, prognosis, role of PT, demonstrating exercise with proper form following verbal and tactile cues. Pt was educated on and agreed to plan of care.  Seated exercise completed without back support to improve trunk strength, endurance, stability. Frequent cuing to activate core and glute muscles.   Therapeutic activities: for functional strengthening and improved functional activity tolerance. - Sit to stand x 5 with BUEs help - Standing balance with x 5 minutes with gluteal activation 3x10 reps - Ambulation ramp up and down 80 feet x 1 and 80 feet from door to car with two lb ankle weight on.   HOME EXERCISE PROGRAM Access Code: [email or web address]  URL:  [email or web address]  Date: 06/03/2019  Prepared by: Rosita Kea   Exercises  Neck Sidebending - 5 reps - 30 seconds hold - 2x daily - 7x weekly  Standing Cervical Rotation AROM - 5 reps - 30 seconds hold - 2x daily - 7x weekly  Seated Shoulder Flexion - 3 sets - 10 reps - 2x daily - 7x weekly  Seated Thoracic Lumbar Extension - 3 sets - 10 reps - 2x daily - 7x weekly  Seated Shoulder Row with Anchored Resistance - 3 sets - 10 reps - 2x daily - 7x weekly      PT Education - 06/25/19 0943    Education Details  Exercise purpose/form.    Person(s) Educated  Patient    Methods  Explanation;Demonstration;Tactile cues;Verbal cues    Comprehension  Verbalized understanding;Returned demonstration;Verbal cues required;Tactile cues required       PT Short Term Goals - 05/29/19 1531      PT SHORT TERM GOAL #1   Title  Be independent with intial home exercise program completed at least 3 times per week for self-management of symptoms.    Baseline  initial HEP provided at IE (09/16);    Time  4  Period  Weeks    Status  New    Target Date  06/26/19        PT Long Term Goals - 05/30/19 [id number]      PT LONG TERM GOAL #1   Title  Be independent with a long-term home exercise program for self-management of symptoms.    Baseline  initial HEP provided at IE (05/29/2019);    Time  12    Period  Weeks    Status  New    Target Date  08/21/19      PT LONG TERM GOAL #2   Title  Patient will be able to ambulate > 200 feet mod I with LRAD for improved community mobility    Baseline  20 feet (05/29/2019);    Time  8    Period  Weeks    Status  New    Target Date  07/24/19      PT LONG TERM GOAL #3   Title  Reduce pain with functional activities to equal or less than 3/10 to allow patient to be more independent with usual activities including ADLs, IADLs, and social engagement with less difficulty.     Baseline  10/10 (05/29/2019);    Time  8    Period  Weeks     Status  New    Target Date  07/24/19      PT LONG TERM GOAL #4   Title  Be able to complete 5 sit to stand test in 20 seconds to improve functional mobility for transfers and community participation.    Baseline  37.5s for 5 STS (05/29/2019);    Time  8    Period  Weeks    Status  New    Target Date  07/24/19      PT LONG TERM GOAL #5   Title  Patient will be able to complete 10 feet TUG within 40s to improve fuctional mobility for transfers and community participation.    Baseline  63s (05/29/2019);    Time  12    Period  Weeks    Status  New    Target Date  08/21/19      Additional Long Term Goals   Additional Long Term Goals  Yes      PT LONG TERM GOAL #6   Title  Demonstrate improved FOTO score by 10 units to demonstrate improvement in overall condition and self-reported functional ability.    Baseline  FOTO 30; MCD 10 (05/29/2019);    Time  12    Period  Weeks    Status  New    Target Date  08/21/19            Plan - 06/25/19 1215    Clinical Impression Statement  Patient tolerated the session fair today and keep progressing towards her goal. Patient unable to proceed L UEs exercise due to pain and have MD visit in Thursday to further investigate. Patient progressed well with LEs strengthening exercise but has difficulties when turning with foot clearance.  Will continue progressing muscle strengthening/endurance, ROM in later session. The pt will benefit from skilled PT services to address deficits and return to PLOF function, independence at home, recreational activity and housework    Personal Factors and Comorbidities  Age;Time since onset of injury/illness/exacerbation;Comorbidity 3+;Fitness    Comorbidities  SOB with exertion; shingles (2020), chronic kidney disease (between stage III-IV according to pt), diabetes (last a1c around 7), stomach polyp that was recently surgically repaired, obesity,  Hx of B rotator cuff tears (with repairs), history of 2 strokes,  pacemaker with revision(properly functioning per pt 05/29/2019), achy joints, bilateral knee replacement, gout, dypsnea, acid reflux, CHF, GI bleed, hypertension, afib w/history of cardioversion, osteoporosis, repeated hospitalization with decreased endurance and functional mobility, wide range of allergies. Recent surgical procedures (in 2019): cardioversion, esophagogastroduendoscopy with propofol (x2), back surgery (pt describes kyphoplasty at 2 levels, and she mentioned prior fusion), eye surgery, diabetes, no seizures. See chart for extensive past medical history.    Examination-Activity Limitations  Dressing;Sleep;Sit;Carry;Bathing;Squat;Bed Mobility;Hygiene/Grooming;Lift;Stairs;Bend;Stand;Caring for Others;Reach Overhead;Transfers;Locomotion Level;Toileting;Continence    Examination-Participation Restrictions  Cleaning;Shop;Laundry;Community Activity;Meal Prep;Yard Work;Driving;Interpersonal Relationship    Stability/Clinical Decision Making  Evolving/Moderate complexity    Rehab Potential  Fair    PT Frequency  2x / week    PT Duration  12 weeks    PT Treatment/Interventions  Cryotherapy;ADLs/Self Care Home Management;Moist Heat;Gait training;Stair training;Functional mobility training;Therapeutic activities;Therapeutic exercise;Balance training;Neuromuscular re-education;Wheelchair mobility training;Patient/family education;Passive range of motion;Manual techniques;Dry needling;Energy conservation;Aquatic Therapy;Other (comment)   Joint mobilizations grades I-IV   PT Next Visit Plan  UE/LE strengthening, ROM, trunk extensor strengthening    PT Home Exercise Plan  Medbridge: [id number] and Agree with Plan of Care  Patient       Patient will benefit from skilled therapeutic intervention in order to improve the following deficits and impairments:  Abnormal gait, Decreased mobility, Decreased coordination, Increased muscle spasms, Postural dysfunction, Pain, Improper body mechanics,  Decreased activity tolerance, Decreased endurance, Decreased range of motion, Decreased strength, Hypomobility, Impaired perceived functional ability, Impaired UE functional use, Decreased balance, Difficulty walking, Decreased safety awareness, Impaired flexibility, Obesity  Visit Diagnosis: Pain in thoracic spine  Chronic midline low back pain without sciatica  Difficulty in walking, not elsewhere classified  Muscle weakness (generalized)  Acute pain of left shoulder     Problem List Patient Active Problem List   Diagnosis Date Noted  . RUQ pain   . UTI (urinary tract infection) 06/14/2018  . Type 2 diabetes mellitus with neurological complications ([place]) 99991111  . Acute respiratory failure ([place]) 04/09/2018  . Chronic anticoagulation 04/09/2018  . Pacemaker 04/09/2018  . H/O TIA (transient ischemic attack) and stroke 04/09/2018  . CAD (coronary artery disease) 04/09/2018  . Hematemesis 04/05/2018  . Hypotension 02/04/2018  . Atrial fibrillation with RVR ([place]) 02/04/2018  . Atrial flutter ([place]) 08/24/2017  . Sinus node dysfunction ([place]) 08/24/2017  . Dizziness 06/12/2017  . Mastalgia 02/14/2017  . Acute on chronic renal insufficiency 01/09/2017  . Fatty liver 01/09/2017  . Benign essential HTN 09/08/2016  . Nephrolithiasis 02/22/2016  . Diabetic neuropathy with neurologic complication ([place]) [id number]  . Moderate episode of recurrent major depressive disorder ([place]) 08/11/2015  . Gastritis without bleeding   . Gastric polyp   . Gastro-esophageal reflux disease without esophagitis   . PAF (paroxysmal atrial fibrillation) ([place]) 06/29/2015  . Intestinal obstruction ([place]) 02/20/2015  . DDD (degenerative disc disease), lumbar 01/19/2015  . Back pain 01/12/2015  . Gout 01/12/2015  . Complete tear of left rotator cuff 10/16/2014  . Intervertebral disc disorder with radiculopathy of lumbar region 09/17/2014  . Lumbar radiculitis 09/17/2014  . Lumbar stenosis with  neurogenic claudication 09/17/2014  . Neuritis or radiculitis due to rupture of lumbar intervertebral disc 09/17/2014  . Atherosclerosis of both carotid arteries 07/15/2014  . Essential hypertension 07/15/2014  . Hyperlipemia, mixed 07/15/2014  . 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 Body mass index (BMI) of 40.0-44.9 in adult ([place]) 06/21/2014  . Mixed incontinence  06/25/2013  . Mixed urge and stress incontinence 06/25/2013  . Nocturia 06/25/2013  . Blurring of visual image 04/11/2012    Sherrilyn Rist, SPT 06/25/19, 1:36 PM  Everlean Alstrom. Graylon Good, PT, DPT 06/25/19, 1:37 PM    [facility] [address] [place], [address] Phone: [phone]   Fax:  [phone]  Name: Deborah Obrien MRN: [id number] Date of Birth: [birth year]

## 2019-06-27 ENCOUNTER — Encounter: Payer: Self-pay | Admitting: Physical Therapy

## 2019-06-27 ENCOUNTER — Encounter: Payer: Medicare Other | Admitting: Physical Therapy

## 2019-06-27 ENCOUNTER — Ambulatory Visit: Payer: Medicare Other | Admitting: Physical Therapy

## 2019-06-27 ENCOUNTER — Other Ambulatory Visit: Payer: Self-pay

## 2019-06-27 DIAGNOSIS — M25512 Pain in left shoulder: Secondary | ICD-10-CM

## 2019-06-27 DIAGNOSIS — M546 Pain in thoracic spine: Secondary | ICD-10-CM

## 2019-06-27 DIAGNOSIS — R262 Difficulty in walking, not elsewhere classified: Secondary | ICD-10-CM

## 2019-06-27 DIAGNOSIS — G8929 Other chronic pain: Secondary | ICD-10-CM

## 2019-06-27 DIAGNOSIS — M545 Low back pain: Secondary | ICD-10-CM | POA: Diagnosis not present

## 2019-06-27 DIAGNOSIS — M6281 Muscle weakness (generalized): Secondary | ICD-10-CM

## 2019-06-27 NOTE — Therapy (Signed)
[facility] [address] [place], [address] Phone: [phone]   Fax:  [phone]  Physical Therapy Treatment  Patient Details  Name: Deborah Obrien MRN: [id number] Date of Birth: 1941/06/06 Referring Provider (PT): Eugene Garnet, MD   Encounter Date: 06/27/2019  PT End of Session - 06/27/19 1446    Visit Number  9    Number of Visits  24    Date for PT Re-Evaluation  08/21/19    Authorization Type  Primary Medicare; secondary BCBS reporting from 05/29/2019    Authorization Time Period  Current certification: [id number] to 08/21/2019    Authorization - Visit Number  9    Authorization - Number of Visits  10    PT Start Time  1430    PT Stop Time  1515    PT Time Calculation (min)  45 min    Activity Tolerance  Patient limited by pain;Patient tolerated treatment well;Patient limited by fatigue    Behavior During Therapy  [facility] for tasks assessed/performed       Past Medical History:  Diagnosis Date  . A-fib ([place]) 2016  . Acid reflux   . Anxiety   . Bowel obstruction ([place])   . CHF (congestive heart failure) ([place]) 2017  . Chronic kidney disease (CKD) stage G3a/A1, moderately decreased glomerular filtration rate (GFR) between 45-59 mL/min/1.73 square meter and albuminuria creatinine ratio less than 30 mg/g   . Diabetes mellitus without complication ([place])   . Dyspnea   . Fatty liver   . GI bleed   . Gout   . Hypertension   . Morbid obesity ([place])   . Osteoarthritis   . Osteoporosis   . Paroxysmal atrial fibrillation (HCC)   . Presence of permanent cardiac pacemaker   . Sinus node dysfunction ([place]) 08/24/2017  . TIA (transient ischemic attack)     Past Surgical History:  Procedure Laterality Date  . ABDOMINAL HYSTERECTOMY  1980  . APPENDECTOMY    . BACK SURGERY     2008  . BACK SURGERY  2019   patient describes kyphoplasty for compression fractures, MD referral said fusion  . BREAST BIOPSY  Right 08/16   stereo fibroadenomatous change, neg for atypia  . BREAST EXCISIONAL BIOPSY Left    neg  . CARDIAC CATHETERIZATION Left 08/25/2016   Procedure: Left Heart Cath and Coronary Angiography;  Surgeon: Corey Skains, MD;  Location: [place];  Service: Cardiovascular;  Laterality: Left;  . CARDIOVERSION N/A 06/28/2017   Procedure: CARDIOVERSION;  Surgeon: Corey Skains, MD;  Location: [facility];  Service: Cardiovascular;  Laterality: N/A;  . CARDIOVERSION N/A 02/06/2018   Procedure: CARDIOVERSION;  Surgeon: Josue Hector, MD;  Location: [facility];  Service: Cardiovascular;  Laterality: N/A;  . CATARACT EXTRACTION, BILATERAL  2012  . CHOLECYSTECTOMY    . colon blockage  1999  . COLON SURGERY  1999  . COLONOSCOPY  2016   polyps removed 2016  . DORSAL COMPARTMENT RELEASE Left 09/14/2016   Procedure: RELEASE DORSAL COMPARTMENT (DEQUERVAIN);  Surgeon: Dereck Leep, MD;  Location: [facility];  Service: Orthopedics;  Laterality: Left;  . ESOPHAGOGASTRODUODENOSCOPY N/A 01/27/2015   Procedure: ESOPHAGOGASTRODUODENOSCOPY (EGD);  Surgeon: Lollie Sails, MD;  Location: [facility];  Service: Endoscopy;  Laterality: N/A;  . ESOPHAGOGASTRODUODENOSCOPY (EGD) WITH PROPOFOL N/A 07/24/2015   Procedure: ESOPHAGOGASTRODUODENOSCOPY (EGD) WITH PROPOFOL;  Surgeon: Lucilla Lame, MD;  Location: [facility];  Service: Endoscopy;  Laterality: N/A;  .  ESOPHAGOGASTRODUODENOSCOPY (EGD) WITH PROPOFOL N/A 04/12/2018   Procedure: ESOPHAGOGASTRODUODENOSCOPY (EGD) WITH PROPOFOL;  Surgeon: Jonathon Bellows, MD;  Location: [facility]COPY;  Service: Gastroenterology;  Laterality: N/A;  . ESOPHAGOGASTRODUODENOSCOPY (EGD) WITH PROPOFOL N/A 06/22/2018   Procedure: ESOPHAGOGASTRODUODENOSCOPY (EGD) WITH PROPOFOL;  Surgeon: [name], MD;  Location: [facility]COPY;  Service: Endoscopy;  Laterality: N/A;  . ESOPHAGOGASTRODUODENOSCOPY (EGD) WITH PROPOFOL N/A 07/09/2018   Procedure:  ESOPHAGOGASTRODUODENOSCOPY (EGD) WITH PROPOFOL with resection of gastric polyps;  Surgeon: Jonathon Bellows, MD;  Location[facility];  Service: Gastroenterology;  Laterality: N/A;  . ESOPHAGOGASTRODUODENOSCOPY (EGD) WITH PROPOFOL N/A 05/17/2019   Procedure: ESOPHAGOGASTRODUODENOSCOPY (EGD) WITH PROPOFOL;  Surgeon: Jonathon Bellows, MD;  Location: [facility]COPY;  Service: Gastroenterology;  Laterality: N/A;  . EUS N/A 06/22/2018   Procedure: UPPER ENDOSCOPIC ULTRASOUND (EUS) RADIAL;  Surgeon: [name], MD;  Location: [facility] ENDOSCOPY;  Service: Endoscopy;  Laterality: N/A;  . JOINT REPLACEMENT  2014   Bilateral Knee replacement  . LEAD REVISION/REPAIR N/A 08/29/2017   Procedure: LEAD REVISION/REPAIR;  Surgeon: Constance Haw, MD;  Location: [place] [facility];  Service: Cardiovascular;  Laterality: N/A;  . OOPHORECTOMY    . PACEMAKER INSERTION Left 07/01/2015   Procedure: INSERTION PACEMAKER;  Surgeon: Isaias Cowman, MD;  Location: [facility];  Service: Cardiovascular;  Laterality: Left;  . PACEMAKER REVISION N/A 08/28/2017   Procedure: PACEMAKER REVISION;  Surgeon: Deboraha Sprang, MD;  Location: [facility];  Service: Cardiovascular;  Laterality: N/A;  . REPLACEMENT TOTAL KNEE BILATERAL    . SHOULDER ARTHROSCOPY WITH SUBACROMIAL DECOMPRESSION Left 2013  . TEE WITHOUT CARDIOVERSION N/A 06/28/2017   Procedure: TRANSESOPHAGEAL ECHOCARDIOGRAM (TEE);  Surgeon: Corey Skains, MD;  Location: [facility];  Service: Cardiovascular;  Laterality: N/A;    There were no vitals filed for this visit.  Subjective Assessment - 06/27/19 1441    Subjective  Patient reports she has some L shoulder pain 6/10. Her blood sugar is 250 mmol/L.    Patient is accompained by:  Family member   husband Deborah Obrien   Pertinent History  Patient is a 78 y.o. female who presents to outpatient physical therapy with a referral for secondary kyphosis of thoracic region. This patient's chief complaints consist of  pain, inability to stand up straight, poor activity tolerance following recently rib fracture at 7th rib at R 04/02/2019. Patient also has a fall history that being dropped at rehab facility leading to 2 compression fractures that required kyphoplasty in late 2019. The above history leading to the following functional deficits: difficulty with ADLs, IADLs, bed mobility, transfers, household and community mobility, social and community participation.  Relevant past medical history and comorbidities include SOB with exertion; shingles (2020), chronic kidney disease (between stage III-IV according to pt), diabetes (last a1c around 7), stomach polyp that was recently surgically repaired, obesity, Hx of B rotator cuff tears (with repairs), history of 2 strokes, pacemaker with revision(properly functioning per pt 05/29/2019), achy joints, bilateral knee replacement, gout, dypsnea, acid reflux, CHF, GI bleed, hypertension, afib w/history of cardioversion, osteoporosis, repeated hospitalization with decreased endurance and functional mobility, wide range of allergies. Recent surgical procedures (in 2019): cardioversion, esophagogastroduendoscopy with propofol (x2), back surgery (pt describes kyphoplasty at 2 levels, and she mentioned prior fusion), eye surgery, diabetes, no seizures. See chart for extensive past medical history.    Limitations  Sitting;Lifting;Standing;Walking;House hold activities    How long can you sit comfortably?  30 min    How long can you stand comfortably?  1-1.5  minutes    How long can you walk comfortably?  less than 20 feet    Diagnostic tests  04/02/2019 CI chest IMPRESSION: Mildly displaced right seventh rib fracture. Scheduled bone density exam on 06/07/2019    Patient Stated Goals  Walking longer distance, able to play with her grandchildren as well a reduce of her back pain.    Pain Onset  More than a month ago       Bloodglucose: patient self-reported250 mmol/L this morning.    TREATMENT:  Pacemaker  Therapeutic exercise:to centralize symptoms and improve ROM, strength, muscular endurance, and activity tolerance required for successful completion of functional activities. -NuSteplevel2-3withusing bilateral lower extremities.(BLEs only)Setting9. For improved extremity mobility, muscular endurance, and activity tolerance; and to induce the analgesic effect of aerobic exercise, stimulate improved joint nutrition, and prepare body structures and systems for followinginterventions.26minutes.74spm, 0. 16mile. - Standing gluteal activation 2x10 reps - Seated marching 96mins without ankle weight. - seated marching 61mins with 2lb ankle weight Seated exercise completed without back support to improve trunk strength, endurance, stability. Frequent cuing to activate core and glute muscles.   Therapeutic activities: for functional strengthening and improved functional activity tolerance. - Sit to stand x 10 with BUEs and standing x 5s each to improve standing balance.  - Ambulation 3x46mins with 2 mins break in between ambulation CGA and patient states increase of back pian and forward folding position due to fatigue and back pain. 2# ankle weights on.     HOME EXERCISE PROGRAM Access Code: [email or web address]  URL: [email or web address]  Date: 06/03/2019  Prepared by: Rosita Kea   Exercises  Neck Sidebending - 5 reps - 30 seconds hold - 2x daily - 7x weekly  Standing Cervical Rotation AROM - 5 reps - 30 seconds hold - 2x daily - 7x weekly  Seated Shoulder Flexion - 3 sets - 10 reps - 2x daily - 7x weekly  Seated Thoracic Lumbar Extension - 3 sets - 10 reps - 2x daily - 7x weekly  Seated Shoulder Row with Anchored Resistance - 3 sets - 10 reps - 2x daily - 7x weekly    Patient tolerated the session fair today and keep progressing towards her goal. Patient demonstrated improved ambulation tolerance but limited due to back pain. Patient  also presented improvements on LEs strengthening exercises. However, patient still requires multiple cuing to keep proper body techniques during ambulation. Will continue progressing muscle strengthening/endurance, ROM in later session. The pt will benefit from skilled PT services to address deficits and return to PLOF function, independence at home, recreational activity and housework.    PT Education - 06/27/19 1446    Education Details  Exercise purpose/form.    Person(s) Educated  Patient    Methods  Explanation;Demonstration;Tactile cues;Verbal cues    Comprehension  Verbalized understanding;Returned demonstration;Verbal cues required;Tactile cues required       PT Short Term Goals - 05/29/19 1531      PT SHORT TERM GOAL #1   Title  Be independent with intial home exercise program completed at least 3 times per week for self-management of symptoms.    Baseline  initial HEP provided at IE (09/16);    Time  4    Period  Weeks    Status  New    Target Date  06/26/19        PT Long Term Goals - 05/30/19 [id number]      PT LONG TERM GOAL #1   Title  Be independent with a long-term home exercise  program for self-management of symptoms.    Baseline  initial HEP provided at IE (05/29/2019);    Time  12    Period  Weeks    Status  New    Target Date  08/21/19      PT LONG TERM GOAL #2   Title  Patient will be able to ambulate > 200 feet mod I with LRAD for improved community mobility    Baseline  20 feet (05/29/2019);    Time  8    Period  Weeks    Status  New    Target Date  07/24/19      PT LONG TERM GOAL #3   Title  Reduce pain with functional activities to equal or less than 3/10 to allow patient to be more independent with usual activities including ADLs, IADLs, and social engagement with less difficulty.     Baseline  10/10 (05/29/2019);    Time  8    Period  Weeks    Status  New    Target Date  07/24/19      PT LONG TERM GOAL #4   Title  Be able to complete 5 sit to  stand test in 20 seconds to improve functional mobility for transfers and community participation.    Baseline  37.5s for 5 STS (05/29/2019);    Time  8    Period  Weeks    Status  New    Target Date  07/24/19      PT LONG TERM GOAL #5   Title  Patient will be able to complete 10 feet TUG within 40s to improve fuctional mobility for transfers and community participation.    Baseline  63s (05/29/2019);    Time  12    Period  Weeks    Status  New    Target Date  08/21/19      Additional Long Term Goals   Additional Long Term Goals  Yes      PT LONG TERM GOAL #6   Title  Demonstrate improved FOTO score by 10 units to demonstrate improvement in overall condition and self-reported functional ability.    Baseline  FOTO 30; MCD 10 (05/29/2019);    Time  12    Period  Weeks    Status  New    Target Date  08/21/19            Plan - 06/27/19 1544    Clinical Impression Statement  Patient tolerated the session fair today and keep progressing towards her goal. Patient demonstrated improved ambulation tolerance but limited due to back pain. Patient also presented improvements on LEs strengthening exercises. However, patient still requires multiple cuing to keep proper body techniques during ambulation. Will continue progressing muscle strengthening/endurance, ROM in later session. The pt will benefit from skilled PT services to address deficits and return to PLOF function, independence at home, recreational activity and housework.    Personal Factors and Comorbidities  Age;Time since onset of injury/illness/exacerbation;Comorbidity 3+;Fitness    Comorbidities  SOB with exertion; shingles (2020), chronic kidney disease (between stage III-IV according to pt), diabetes (last a1c around 7), stomach polyp that was recently surgically repaired, obesity, Hx of B rotator cuff tears (with repairs), history of 2 strokes, pacemaker with revision(properly functioning per pt 05/29/2019), achy joints,  bilateral knee replacement, gout, dypsnea, acid reflux, CHF, GI bleed, hypertension, afib w/history of cardioversion, osteoporosis, repeated hospitalization with decreased endurance and functional mobility, wide range of allergies. Recent surgical procedures (in  2019): cardioversion, esophagogastroduendoscopy with propofol (x2), back surgery (pt describes kyphoplasty at 2 levels, and she mentioned prior fusion), eye surgery, diabetes, no seizures. See chart for extensive past medical history.    Examination-Activity Limitations  Dressing;Sleep;Sit;Carry;Bathing;Squat;Bed Mobility;Hygiene/Grooming;Lift;Stairs;Bend;Stand;Caring for Others;Reach Overhead;Transfers;Locomotion Level;Toileting;Continence    Examination-Participation Restrictions  Cleaning;Shop;Laundry;Community Activity;Meal Prep;Yard Work;Driving;Interpersonal Relationship    Stability/Clinical Decision Making  Evolving/Moderate complexity    Rehab Potential  Fair    PT Frequency  2x / week    PT Duration  12 weeks    PT Treatment/Interventions  Cryotherapy;ADLs/Self Care Home Management;Moist Heat;Gait training;Stair training;Functional mobility training;Therapeutic activities;Therapeutic exercise;Balance training;Neuromuscular re-education;Wheelchair mobility training;Patient/family education;Passive range of motion;Manual techniques;Dry needling;Energy conservation;Aquatic Therapy;Other (comment)   Joint mobilizations grades I-IV   PT Next Visit Plan  UE/LE strengthening, ROM, trunk extensor strengthening    PT Home Exercise Plan  Medbridge: [id number] and Agree with Plan of Care  Patient       Patient will benefit from skilled therapeutic intervention in order to improve the following deficits and impairments:  Abnormal gait, Decreased mobility, Decreased coordination, Increased muscle spasms, Postural dysfunction, Pain, Improper body mechanics, Decreased activity tolerance, Decreased endurance, Decreased range of motion,  Decreased strength, Hypomobility, Impaired perceived functional ability, Impaired UE functional use, Decreased balance, Difficulty walking, Decreased safety awareness, Impaired flexibility, Obesity  Visit Diagnosis: Pain in thoracic spine  Chronic midline low back pain without sciatica  Difficulty in walking, not elsewhere classified  Muscle weakness (generalized)  Acute pain of left shoulder     Problem List Patient Active Problem List   Diagnosis Date Noted  . RUQ pain   . UTI (urinary tract infection) 06/14/2018  . Type 2 diabetes mellitus with neurological complications ([place]) 99991111  . Acute respiratory failure ([place]) 04/09/2018  . Chronic anticoagulation 04/09/2018  . Pacemaker 04/09/2018  . H/O TIA (transient ischemic attack) and stroke 04/09/2018  . CAD (coronary artery disease) 04/09/2018  . Hematemesis 04/05/2018  . Hypotension 02/04/2018  . Atrial fibrillation with RVR ([place]) 02/04/2018  . Atrial flutter ([place]) 08/24/2017  . Sinus node dysfunction ([place]) 08/24/2017  . Dizziness 06/12/2017  . Mastalgia 02/14/2017  . Acute on chronic renal insufficiency 01/09/2017  . Fatty liver 01/09/2017  . Benign essential HTN 09/08/2016  . Nephrolithiasis 02/22/2016  . Diabetic neuropathy with neurologic complication ([place]) [id number]  . Moderate episode of recurrent major depressive disorder ([place]) 08/11/2015  . Gastritis without bleeding   . Gastric polyp   . Gastro-esophageal reflux disease without esophagitis   . PAF (paroxysmal atrial fibrillation) ([place]) 06/29/2015  . Intestinal obstruction ([place]) 02/20/2015  . DDD (degenerative disc disease), lumbar 01/19/2015  . Back pain 01/12/2015  . Gout 01/12/2015  . Complete tear of left rotator cuff 10/16/2014  . Intervertebral disc disorder with radiculopathy of lumbar region 09/17/2014  . Lumbar radiculitis 09/17/2014  . Lumbar stenosis with neurogenic claudication 09/17/2014  . Neuritis or radiculitis due to rupture of  lumbar intervertebral disc 09/17/2014  . Atherosclerosis of both carotid arteries 07/15/2014  . Essential hypertension 07/15/2014  . Hyperlipemia, mixed 07/15/2014  . 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 Body mass index (BMI) of 40.0-44.9 in adult ([place]) 06/21/2014  . Mixed incontinence 06/25/2013  . Mixed urge and stress incontinence 06/25/2013  . Nocturia 06/25/2013  . Blurring of visual image 04/11/2012    Sherrilyn Rist, SPT 06/27/19, 5:27 PM  Everlean Alstrom. Graylon Good, PT, DPT 06/27/19, 5:28 PM   [facility] [address]  [place], [place], [address] Phone: [phone]   Fax:  [phone]  Name: Deborah Obrien MRN: [id number] Date of Birth: 07-25-1941

## 2019-07-01 ENCOUNTER — Ambulatory Visit (INDEPENDENT_AMBULATORY_CARE_PROVIDER_SITE_OTHER): Payer: Medicare Other | Admitting: Gastroenterology

## 2019-07-01 ENCOUNTER — Ambulatory Visit: Payer: Medicare Other | Admitting: Physical Therapy

## 2019-07-01 DIAGNOSIS — K317 Polyp of stomach and duodenum: Secondary | ICD-10-CM | POA: Diagnosis not present

## 2019-07-01 NOTE — Progress Notes (Signed)
Deborah Obrien , MD [address]  [place]  [place], [place] [address]  Main: [phone]  Fax: [phone]   Primary Care Physician: [name], MD  Virtual Visit via Telephone Note  I connected with patient on 07/01/19 at  1:00 PM EDT by telephone and verified that I am speaking with the correct person using two identifiers.   I discussed the limitations, risks, security and privacy concerns of performing an evaluation and management service by telephone and the availability of in person appointments. I also discussed with the patient that there may be a patient responsible charge related to this service. The patient expressed understanding and agreed to proceed.  Location of Patient: Home Location of Provider: Home Persons involved: Patient and provider only   History of Present Illness:  Gastric polyps follow-up  HPI: Deborah Obrien is a 78 y.o. Obrien   Summary of history :  Deborah Hoelzel Burgessis a 78 y.o.y/o femaleadmitted in mid 2019  with a severe upper GI bleed. I performed an upper endoscopy and noted a large gastric polyp that was bleeding. At that point of time I sprayed hemosprayand obtained hemostasis. She was on Eliquis for atrial fibrillation and she has had a history of strokes. She is status post cholecystectomy.. She underwent an USG RUQ which showed a mildly dilated CBD 9 mm, she underwent an EUS at [facility] after and found no stones in her CBD and was discharged. 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 Features of prior kyphoplasty at t12- L1  EGD 07/09/18 : EGD: 4 large gastric polyps were resected. All were hyperplastic , few other smaller polyps seen but not resected.   Interval history2/19/2020-07/01/2019  05/17/2019: EGD: Notable small sessile polyps resected.  A 15 mm sessile  polyp was also resected by EMR.  All polyps were gastric hyperplastic polyps with surface elevation and inflammation were negative for malignancy.  06/18/2019: Hemoglobin 12.1.  She is doing well no issues with her GI tract.  Her only issues are with her back which she is undergoing physical therapy for she is taking her Protonix 40 mg twice a day.  Denies any melena. Current Outpatient Medications  Medication Sig Dispense Refill   allopurinol (ZYLOPRIM) 100 MG tablet Take 150 mg by mouth daily.      amiodarone (PACERONE) 200 MG tablet Take 1 tablet (200 mg total) by mouth daily.     benzocaine-Menthol (DERMOPLAST) 20-0.5 % AERO Apply 1 application topically 4 (four) times daily as needed for irritation.     bisacodyl (DULCOLAX) 10 MG suppository Place 1 suppository (10 mg total) rectally daily as needed for moderate constipation. (Patient not taking: Reported on 05/29/2019) 12 suppository 0   busPIRone (BUSPAR) 10 MG tablet Take 10 mg by mouth 2 (two) times daily.      carboxymethylcellulose (REFRESH PLUS) 0.5 % SOLN 1 drop 2 (two) times daily as needed.     colchicine 0.6 MG tablet Take 0.6 mg by mouth daily as needed (gout).      dibucaine (NUPERCAINAL) 1 % OINT Place 1 application rectally as needed for hemorrhoids. (Patient not taking: Reported on 05/29/2019)  0   DULoxetine (CYMBALTA) 30 MG capsule Take by mouth daily.     ELIQUIS 5 MG TABS tablet TAKE 1 TABLET BY MOUTH TWICE DAILY 60 tablet 3   furosemide (LASIX)  20 MG tablet Take 20 mg by mouth daily.     gabapentin (NEURONTIN) 600 MG tablet Take 600 mg by mouth 3 (three) times daily.     HYDROcodone-acetaminophen (NORCO) 10-325 MG tablet Take 1 tablet by mouth every 6 (six) hours as needed.     hydrocortisone 2.5 % cream Apply 1 application topically daily as needed (rash).     insulin NPH-regular Human (NOVOLIN 70/30) (70-30) 100 UNIT/ML injection Inject 22 Units into the skin 2 (two) times daily.      levothyroxine  (SYNTHROID, LEVOTHROID) 25 MCG tablet TAKE 1 TABLET BY MOUTH ONCE DAILY BEFORE BREAKFAST 90 tablet 1   neomycin-bacitracin-polymyxin (NEOSPORIN) ointment Apply topically as needed for wound care. 15 g 0   NON FORMULARY Diet Type: Mechanical Soft Chopped     oxybutynin (DITROPAN) 5 MG tablet Take 10 mg by mouth 2 (two) times daily.     pantoprazole (PROTONIX) 40 MG tablet Take 1 tablet by mouth twice daily 180 tablet 1   Polyethyl Glycol-Propyl Glycol (SYSTANE) 0.4-0.3 % SOLN Place 1 drop into both eyes 2 (two) times daily as needed.     pravastatin (PRAVACHOL) 20 MG tablet Take 20 mg by mouth every evening.  2   senna-docusate (SENOKOT-S) 8.6-50 MG tablet Take 1 tablet by mouth every morning. (Patient not taking: Reported on 06/03/2019)     sucralfate (CARAFATE) 1 GM/10ML suspension Take 10 mLs (1 g total) by mouth 4 (four) times daily. 1200 mL 2   talc (ZEASORB) powder Apply 1 application topically as needed (under the breast irritation).     traZODone (DESYREL) 50 MG tablet Take 50-100 mg by mouth at bedtime.      vitamin C (ASCORBIC ACID) 500 MG tablet Take 500 mg by mouth 2 (two) times daily.     No current facility-administered medications for this visit.     Allergies as of 07/01/2019 - Review Complete 06/27/2019  Allergen Reaction Noted   Cephalosporins Other (See Comments) 01/19/2015   Fish allergy Shortness Of Breath 11/26/2015   Macrolides and ketolides Other (See Comments) 01/12/2015   Meperidine Shortness Of Breath, Nausea Only, and Other (See Comments) 01/19/2015   Other Nausea Only, Rash, and Other (See Comments) 01/12/2015   Prednisone Anaphylaxis and Other (See Comments) 01/19/2015   Shellfish allergy Anaphylaxis, Shortness Of Breath, Diarrhea, Nausea Only, and Rash 06/29/2015   Sulfa antibiotics Shortness Of Breath, Diarrhea, Nausea Only, and Other (See Comments) 06/25/2013   Sulfacetamide sodium Diarrhea, Nausea Only, Other (See Comments), and Shortness  Of Breath 08/12/2015   Telbivudine  08/12/2015   Uloric [febuxostat] Anaphylaxis 08/22/2016   Aspirin Other (See Comments) 01/19/2015   Celecoxib Other (See Comments) 01/19/2015   Cephalexin Diarrhea, Nausea Only, and Other (See Comments) 01/19/2015   Erythromycin Diarrhea, Nausea Only, and Other (See Comments) 01/19/2015   Hydromorphone Other (See Comments) 01/19/2015   Iodinated diagnostic agents Rash and Other (See Comments) 01/19/2015   Oxycodone Other (See Comments) 01/19/2015   Aleve [naproxen]  07/03/2018   Atorvastatin Nausea Only and Other (See Comments) 01/19/2015   Codeine Diarrhea, Nausea Only, and Other (See Comments) 01/19/2015   Dilaudid [hydromorphone hcl]  07/03/2018   Doxycycline  08/24/2017   Lipitor [atorvastatin calcium] Nausea Only 07/03/2018   Motrin [ibuprofen]  07/03/2018   Tape Other (See Comments) 06/29/2015   Valdecoxib Swelling and Other (See Comments) 01/19/2015   Iodine Rash 06/29/2015    Review of Systems:    All systems reviewed and negative except where noted in HPI.  Observations/Objective:  Labs: CMP     Component Value Date/Time   NA 137 09/14/2018 1245   NA 141 10/16/2014 1135   K 4.3 09/14/2018 1245   K 4.6 10/16/2014 1135   CL 103 09/14/2018 1245   CL 108 (H) 10/16/2014 1135   CO2 24 09/14/2018 1245   CO2 27 10/16/2014 1135   GLUCOSE 149 (H) 09/14/2018 1245   GLUCOSE 97 10/16/2014 1135   BUN 32 (H) 09/14/2018 1245   BUN 25 (H) 10/16/2014 1135   CREATININE 1.94 (H) 09/14/2018 1245   CREATININE 1.35 (H) 10/16/2014 1135   CALCIUM 8.8 (L) 09/14/2018 1245   CALCIUM 8.5 10/16/2014 1135   PROT 6.6 09/14/2018 1245   PROT 8.0 10/29/2012 1100   ALBUMIN 3.5 09/14/2018 1245   ALBUMIN 3.9 10/29/2012 1100   AST 42 (H) 09/14/2018 1245   AST 47 (H) 10/29/2012 1100   ALT 21 09/14/2018 1245   ALT 27 10/29/2012 1100   ALKPHOS 112 09/14/2018 1245   ALKPHOS 156 (H) 10/29/2012 1100   BILITOT 1.1 09/14/2018 1245   BILITOT  1.6 (H) 10/29/2012 1100   GFRNONAA 24 (L) 09/14/2018 1245   GFRNONAA 41 (L) 10/16/2014 1135   GFRNONAA 35 (L) 10/29/2012 1100   GFRAA 28 (L) 09/14/2018 1245   GFRAA 49 (L) 10/16/2014 1135   GFRAA 41 (L) 10/29/2012 1100   Lab Results  Component Value Date   WBC 6.9 10/31/2018   HGB 11.5 10/31/2018   HCT 36.1 10/31/2018   MCV 95 10/31/2018   PLT 247 10/31/2018    Imaging Studies: Dg Bone Density  Result Date: 06/07/2019 EXAM: DUAL X-RAY ABSORPTIOMETRY (DXA) FOR BONE MINERAL DENSITY IMPRESSION: Referring Physician:  Starling Manns Your patient completed a BMD test using Lunar IDXA DXA system ( analysis version: 16 ) manufactured by [employer]. Technologist: AW PATIENT: Name: Deborah Obrien, Deborah Obrien Patient ID: [id number] Birth Date: 1941/05/27 Height: 63.0 in. Sex: Obrien Measured: 06/07/2019 Weight: 219.2 lbs. Indications: Advanced Age, Bilateral Ovariectomy (65.51), Caucasian, cymbalta, Desyrel, Estrogen Deficient, Gabapentin, Hypothyroid, Hysterectomy, Insulin for Diabetes, Pantoprazole, Postmenopausal Fractures: Rib, vertebrae Treatments: Calcium (E943.0) ASSESSMENT: The BMD measured at Femur Neck Right is 0.645 g/cm2 with a T-score of -2.8. 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 The scan quality is good. Lumbar spine was not utilized due to advanced degenerative changes. Site Region Measured Date Measured Age YA T-score BMD Significant CHANGE DualFemur Neck Right 06/07/2019 78.7 [age]2.7 0.645 g/cm2 DualFemur Total Mean 06/07/2019 78.7 -2.0 0.759 g/cm2 Left Forearm Radius 33% 06[age]07/2019 78.7 -1.2 0.777 g/cm2 World Health Organization [facility]) criteria for post-menopausal, Caucasian Women: Normal       T-score at or above -1 SD Osteopenia   T-score between -1 and -2.5 SD Osteoporosis T-score at or below -2.5 SD RECOMMENDATION: 1. All patients should optimize calcium and vitamin D intake. 2. Consider FDA approved medical therapies in postmenopausal women  and men aged 8 years and older, based on the following: a. A hip or vertebral (clinical or morphometric) fracture b. T- score < or = -2.5 at the femoral neck or spine after appropriate evaluation to exclude secondary causes c. Low bone mass (T-score between -1.0 and -2.5 at the femoral neck or spine) and a 10 year probability of a hip fracture > or = 3% or a 10 year probability of a major osteoporosis-related fracture > or = 20% based on the [place]-adapted WHO algorithm d. Clinician judgment and/or patient preferences may indicate treatment for people with  10-year fracture probabilities above or below these levels FOLLOW-UP: Patients with diagnosis of osteoporosis or at high risk for fracture should have regular bone mineral density tests. For patients eligible for Medicare, routine testing is allowed once every 2 years. The testing frequency can be increased to one year for patients who have rapidly progressing disease, those who are receiving or discontinuing medical therapy to restore bone mass, or have additional risk factors. I have reviewed this report and agree with the above findings. Mark A. Thornton Papas, M.D. [facility] Radiology Electronically Signed   By: Lavonia Dana M.D.   On: 06/07/2019 10:23    Assessment and Plan:   Deborah Obrien is a 78 y.o. y/o Obrien to follow-up for gastric polyps which caused severe GI bleed in the past requiring resection.  On Eliquis s/p EGD and restoration of bleeding and resection of multiple gastric polyps.  Every time we perform an endoscopy she has severe back pain and chest wall pain.  We discussed that moving forward we need to balance the risks of the procedure and the subsequent discomfort she faces versus the benefits of the procedure.  There are no clear answers with this.  Is probably going to be harder as she gets older.  At this point of time we could probably repeat an endoscopy in 1 year and if things look reasonably stable probably push forward the next  endoscopy in 3 years.  In the interim I suggest that she has a CBC monitored by her primary care physician to ensure that she is not getting anemic.  In addition I suggested that we decrease the dose of Protonix from 40 mg twice a day to 40 mg once a day.  I believe she cannot go off the PPI due to the fact that she is on a blood thinner which she takes at the same time.    I discussed the assessment and treatment plan with the patient. The patient was provided an opportunity to ask questions and all were answered. The patient agreed with the plan and demonstrated an understanding of the instructions.   The patient was advised to call back or seek an in-person evaluation if the symptoms worsen or if the condition fails to improve as anticipated.  I provided 12 minutes of non-face-to-face time during this encounter.  Dr Deborah Bellows MD,MRCP [facility]) Gastroenterology/Hepatology Pager: [phone]   Speech recognition software was used to dictate this note.

## 2019-07-02 ENCOUNTER — Encounter: Payer: Medicare Other | Admitting: Physical Therapy

## 2019-07-03 ENCOUNTER — Encounter: Payer: Self-pay | Admitting: Physical Therapy

## 2019-07-03 ENCOUNTER — Ambulatory Visit: Payer: Medicare Other | Admitting: Physical Therapy

## 2019-07-03 ENCOUNTER — Other Ambulatory Visit: Payer: Self-pay

## 2019-07-03 DIAGNOSIS — R262 Difficulty in walking, not elsewhere classified: Secondary | ICD-10-CM

## 2019-07-03 DIAGNOSIS — G8929 Other chronic pain: Secondary | ICD-10-CM

## 2019-07-03 DIAGNOSIS — M545 Low back pain, unspecified: Secondary | ICD-10-CM

## 2019-07-03 DIAGNOSIS — M546 Pain in thoracic spine: Secondary | ICD-10-CM

## 2019-07-03 DIAGNOSIS — M25512 Pain in left shoulder: Secondary | ICD-10-CM

## 2019-07-03 DIAGNOSIS — M6281 Muscle weakness (generalized): Secondary | ICD-10-CM

## 2019-07-03 NOTE — Therapy (Signed)
[place] [facility] [address] [place], [address] Phone: [phone]   Fax:  [phone]  Physical Therapy Treatment/ Progress note  Reporting period 05/29/2019 - 07/03/2019  Patient Details  Name: Deborah Obrien MRN: [id number] Date of Birth: 10/31/1940 Referring Provider (PT): Eugene Garnet, MD   Encounter Date: 07/03/2019  PT End of Session - 07/03/19 1656    Visit Number  10    Number of Visits  24    Date for PT Re-Evaluation  08/21/19    Authorization Type  Primary Medicare; secondary BCBS reporting from 05/29/2019    Authorization Time Period  Current certification: [id number] to 08/21/2019    Authorization - Visit Number  0    Authorization - Number of Visits  10    PT Start Time  1116    PT Stop Time  1203    PT Time Calculation (min)  47 min    Activity Tolerance  Patient limited by pain;Patient tolerated treatment well;Patient limited by fatigue    Behavior During Therapy  [facility] for tasks assessed/performed       Past Medical History:  Diagnosis Date  . A-fib ([place]) 2016  . Acid reflux   . Anxiety   . Bowel obstruction ([place])   . CHF (congestive heart failure) ([place]) 2017  . Chronic kidney disease (CKD) stage G3a/A1, moderately decreased glomerular filtration rate (GFR) between 45-59 mL/min/1.73 square meter and albuminuria creatinine ratio less than 30 mg/g   . Diabetes mellitus without complication ([place])   . Dyspnea   . Fatty liver   . GI bleed   . Gout   . Hypertension   . Morbid obesity ([place])   . Osteoarthritis   . Osteoporosis   . Paroxysmal atrial fibrillation (HCC)   . Presence of permanent cardiac pacemaker   . Sinus node dysfunction ([place]) 08/24/2017  . TIA (transient ischemic attack)     Past Surgical History:  Procedure Laterality Date  . ABDOMINAL HYSTERECTOMY  1980  . APPENDECTOMY    . BACK SURGERY     2008  . BACK SURGERY  2019   patient describes kyphoplasty for  compression fractures, MD referral said fusion  . BREAST BIOPSY Right 08/16   stereo fibroadenomatous change, neg for atypia  . BREAST EXCISIONAL BIOPSY Left    neg  . CARDIAC CATHETERIZATION Left 08/25/2016   Procedure: Left Heart Cath and Coronary Angiography;  Surgeon: Corey Skains, MD;  Location: [facility];  Service: Cardiovascular;  Laterality: Left;  . CARDIOVERSION N/A 06/28/2017   Procedure: CARDIOVERSION;  Surgeon: Corey Skains, MD;  Location: [facility];  Service: Cardiovascular;  Laterality: N/A;  . CARDIOVERSION N/A 02/06/2018   Procedure: CARDIOVERSION;  Surgeon: Josue Hector, MD;  Location: [facility];  Service: Cardiovascular;  Laterality: N/A;  . CATARACT EXTRACTION, BILATERAL  2012  . CHOLECYSTECTOMY    . colon blockage  1999  . COLON SURGERY  1999  . COLONOSCOPY  2016   polyps removed 2016  . DORSAL COMPARTMENT RELEASE Left 09/14/2016   Procedure: RELEASE DORSAL COMPARTMENT (DEQUERVAIN);  Surgeon: Dereck Leep, MD;  Location: [facility];  Service: Orthopedics;  Laterality: Left;  . ESOPHAGOGASTRODUODENOSCOPY N/A 01/27/2015   Procedure: ESOPHAGOGASTRODUODENOSCOPY (EGD);  Surgeon: Lollie Sails, MD;  Location: [facility];  Service: Endoscopy;  Laterality: N/A;  . ESOPHAGOGASTRODUODENOSCOPY (EGD) WITH PROPOFOL N/A 07/24/2015   Procedure: ESOPHAGOGASTRODUODENOSCOPY (EGD) WITH PROPOFOL;  Surgeon: Lucilla Lame, MD;  Location:  [facility];  Service: Endoscopy;  Laterality: N/A;  . ESOPHAGOGASTRODUODENOSCOPY (EGD) WITH PROPOFOL N/A 04/12/2018   Procedure: ESOPHAGOGASTRODUODENOSCOPY (EGD) WITH PROPOFOL;  Surgeon: Jonathon Bellows, MD;  Location[facility];  Service: Gastroenterology;  Laterality: N/A;  . ESOPHAGOGASTRODUODENOSCOPY (EGD) WITH PROPOFOL N/A 06/22/2018   Procedure: ESOPHAGOGASTRODUODENOSCOPY (EGD) WITH PROPOFOL;  Surgeon: [name], MD;  Location: [facility] ENDOSCOPY;  Service: Endoscopy;  Laterality: N/A;  . ESOPHAGOGASTRODUODENOSCOPY (EGD)  WITH PROPOFOL N/A 07/09/2018   Procedure: ESOPHAGOGASTRODUODENOSCOPY (EGD) WITH PROPOFOL with resection of gastric polyps;  Surgeon: Jonathon Bellows, MD;  Location: [facility]COPY;  Service: Gastroenterology;  Laterality: N/A;  . ESOPHAGOGASTRODUODENOSCOPY (EGD) WITH PROPOFOL N/A 05/17/2019   Procedure: ESOPHAGOGASTRODUODENOSCOPY (EGD) WITH PROPOFOL;  Surgeon: Jonathon Bellows, MD;  Location: [facility] ENDOSCOPY;  Service: Gastroenterology;  Laterality: N/A;  . EUS N/A 06/22/2018   Procedure: UPPER ENDOSCOPIC ULTRASOUND (EUS) RADIAL;  Surgeon: [name], MD;  Location: [facility] ENDOSCOPY;  Service: Endoscopy;  Laterality: N/A;  . JOINT REPLACEMENT  2014   Bilateral Knee replacement  . LEAD REVISION/REPAIR N/A 08/29/2017   Procedure: LEAD REVISION/REPAIR;  Surgeon: Constance Haw, MD;  Location: [facility];  Service: Cardiovascular;  Laterality: N/A;  . OOPHORECTOMY    . PACEMAKER INSERTION Left 07/01/2015   Procedure: INSERTION PACEMAKER;  Surgeon: Isaias Cowman, MD;  Location: [facility];  Service: Cardiovascular;  Laterality: Left;  . PACEMAKER REVISION N/A 08/28/2017   Procedure: PACEMAKER REVISION;  Surgeon: Deboraha Sprang, MD;  Location: [facility];  Service: Cardiovascular;  Laterality: N/A;  . REPLACEMENT TOTAL KNEE BILATERAL    . SHOULDER ARTHROSCOPY WITH SUBACROMIAL DECOMPRESSION Left 2013  . TEE WITHOUT CARDIOVERSION N/A 06/28/2017   Procedure: TRANSESOPHAGEAL ECHOCARDIOGRAM (TEE);  Surgeon: Corey Skains, MD;  Location: [facility];  Service: Cardiovascular;  Laterality: N/A;    There were no vitals filed for this visit.  Subjective Assessment - 07/03/19 1123    Subjective  Patient reports her back pain only soreness but can be 4/10 when she moves in certain way. Patient states she see her improvements with physcial therapy session and want do more sessions to help her doing more at home.    Patient is accompained by:  Family member   [name]   Pertinent  History  Patient is a 78 y.o. female who presents to outpatient physical therapy with a referral for secondary kyphosis of thoracic region. This patient's chief complaints consist of pain, inability to stand up straight, poor activity tolerance following recently rib fracture at 7th rib at R 04/02/2019. Patient also has a fall history that being dropped at rehab facility leading to 2 compression fractures that required kyphoplasty in late 2019. The above history leading to the following functional deficits: difficulty with ADLs, IADLs, bed mobility, transfers, household and community mobility, social and community participation.  Relevant past medical history and comorbidities include SOB with exertion; shingles (2020), chronic kidney disease (between stage III-IV according to pt), diabetes (last a1c around 7), stomach polyp that was recently surgically repaired, obesity, Hx of B rotator cuff tears (with repairs), history of 2 strokes, pacemaker with revision(properly functioning per pt 05/29/2019), achy joints, bilateral knee replacement, gout, dypsnea, acid reflux, CHF, GI bleed, hypertension, afib w/history of cardioversion, osteoporosis, repeated hospitalization with decreased endurance and functional mobility, wide range of allergies. Recent surgical procedures (in 2019): cardioversion, esophagogastroduendoscopy with propofol (x2), back surgery (pt describes kyphoplasty at 2 levels, and she mentioned prior fusion), eye surgery, diabetes, no seizures. See chart for extensive  past medical history.    Limitations  Sitting;Lifting;Standing;Walking;House hold activities    How long can you sit comfortably?  30 min    How long can you stand comfortably?  1-1.5 minutes    How long can you walk comfortably?  less than 20 feet    Diagnostic tests  04/02/2019 CI chest IMPRESSION: Mildly displaced right seventh rib fracture. Scheduled bone density exam on 06/07/2019    Patient Stated Goals  Walking longer distance,  able to play with her grandchildren as well a reduce of her back pain.    Currently in Pain?  Yes    Pain Score  4     Pain Location  Back    Pain Orientation  Left    Pain Onset  More than a month ago         [facility] PT Assessment - 07/03/19 0001      Assessment   Medical Diagnosis   secondary kyphosis of thoracic region    Referring Provider (PT)  Eugene Garnet, MD    Onset Date/Surgical Date  --   2019 due to fall at nursing home   Hand Dominance  Right    Prior Therapy  PT      Precautions   Precautions  ICD/Pacemaker    Precaution Comments  Pacemaker working properly per patient report     Required Braces or Orthoses  --   Patient's own spinal  brace but not required for therapy     Restrictions   Weight Bearing Restrictions  No    Other Position/Activity Restrictions  --   "no heavy lifting" due to live fx     [place] residence    Living Arrangements  Spouse/significant other    Available Help at Discharge  Family    Type of [place] entrance      Prior Function   Level of [place] with household mobility with device;Needs assistance with homemaking;Independent with basic ADLs;Needs assistance with transfers    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   Chair to chair ambulation transfer with Rolator. SBA Gait:   2 min walking test 163 feet with rollator. SBA  SPECIAL TESTS: TUG 10 feet:   27 seconds with rollator. SBA   5xSTS: 20 seconds. Requires BUE support on  thigh. Mild increase of back pain.   Therapeutic exercise: to centralize symptoms and improve ROM, strength, muscular endurance, and activity tolerance required for successful completion of functional activities.  - Ambulation x 20 feet with rolator SBA to improve walking speed  - Ambulation 163 feet with rolator SBA to improve activity tolerance - Sit to stand 2x10 reps  - Seated 10min marching  - Cervical ROM flexion/extension/side flexion/rotation x 2 each side.  - Extra break provided between exercises required by patient due to fatigue.  -Patient was educated on diagnosis, anatomy and pathology involved, prognosis, and role  of PT. Pt was educated on and agreed to plan of care.  HOME EXERCISE PROGRAM Access Code: [email or web address]  URL: [email or web address]  Date: 06/03/2019  Prepared by: Rosita Kea   Exercises  Neck Sidebending - 5 reps - 30 seconds hold - 2x daily - 7x weekly  Standing Cervical Rotation AROM - 5 reps - 30 seconds hold - 2x daily - 7x weekly  Seated Shoulder Flexion - 3 sets - 10 reps - 2x daily - 7x weekly  Seated Thoracic Lumbar Extension - 3 sets - 10 reps - 2x daily - 7x weekly  Seated Shoulder Row with Anchored Resistance - 3 sets - 10 reps - 2x daily - 7x weekly    Clinical impression:  Patient has attended 10 skilled physical therapy treatment sessions this episode of care and overall presents with great progress towards stated goals. Subjectively, patient reports great improvements from day 1 using wheelchair to today using rolator. She also able to do more housework at home and felt pretty happy about her progress. Objectively, patient demonstrates great progression in walking distance and speed (26min walking test, TUG), LE strength and power (5TSTS), and pain free AROM at cervical region.  Patient continues to present with significant strength, balance, pain, activity tolerance, high fall risk impairments that are limiting ability to complete her  usual ADLs, IADLs, community participation, care for others, manage her home, bend, lift, ambulate community distances and complete transfers without difficulty or falling. Patient will benefit from continued skilled physical therapy intervention to address current body structure impairments and activity limitations to improve function and work towards goals set in current POC in order to return to prior level of function or maximal functional improvement.      PT Education - 07/03/19 1651    Education Details  Exercise purpose/form. Purpose of the session.    Person(s) Educated  Patient    Methods  Explanation;Demonstration;Tactile cues;Verbal cues    Comprehension  Verbalized understanding;Returned demonstration;Verbal cues required;Tactile cues required       PT Short Term Goals - 07/03/19 1152      PT SHORT TERM GOAL #1   Title  Be independent with intial home exercise program completed at least 3 times per week for self-management of symptoms.    Baseline  initial HEP provided at IE (09/16);    Time  4    Period  Weeks    Status  Achieved    Target Date  06/26/19        PT Long Term Goals - 07/03/19 1153      PT LONG TERM GOAL #1   Title  Be independent with a long-term home exercise program for self-management of symptoms.    Baseline  initial HEP provided at IE (05/29/2019); Continue progress HEP (07/03/2019)    Time  12    Period  Weeks    Status  On-going    Target Date  08/21/19      PT LONG TERM GOAL #2   Title  Patient will be able to ambulate > 200 feet with LRAD for improved community mobility    Baseline  20 feet (05/29/2019); 163 feet with SBA  (07/03/2019)    Time  8    Period  Weeks    Status  Revised    Target Date  08/21/19      PT LONG TERM GOAL #3   Title  Reduce pain with functional activities to equal or less than 3/10 to allow patient to be  more independent with usual activities including ADLs, IADLs, and social engagement with less difficulty.      Baseline  10/10 (05/29/2019); 4/10 (07/03/2019)    Time  8    Period  Weeks    Status  On-going    Target Date  07/24/19      PT LONG TERM GOAL #4   Title  Be able to complete 5 sit to stand test in 15 seconds to improve functional mobility for transfers and community participation.    Baseline  37.5s for 5 STS (05/29/2019); 20s with BUEs support at thigh (07/03/2019)    Time  8    Period  Weeks    Status  Revised    Target Date  07/24/19      PT LONG TERM GOAL #5   Title  Patient will be able to complete 10 feet TUG within 20s to improve fuctional mobility for transfers and community participation.    Baseline  63s (05/29/2019); 27 seconds with rollator (07/03/2019)    Time  12    Period  Weeks    Status  Revised    Target Date  08/21/19      PT LONG TERM GOAL #6   Title  Demonstrate improved FOTO score by 10 units to demonstrate improvement in overall condition and self-reported functional ability.    Baseline  FOTO 30; MCD 10 (05/29/2019); Deferred 06/24/2019    Time  12    Period  Weeks    Status  New    Target Date  08/21/19            Plan - 07/03/19 1659    Clinical Impression Statement  Patient has attended 10 skilled physical therapy treatment sessions this episode of care and overall presents with great progress towards stated goals. Subjectively, patient reports great improvements from day 1 using wheelchair to today using rolator. She also able to do more housework at home and felt pretty happy about her progress. Objectively, patient demonstrates great progression in walking distance and speed (46min walking test, TUG), LE strength and power (5TSTS), and pain free AROM at cervical region.  Patient has been showing good gain in the previous PT treatments, and patient continues to present with significant strength, ROM, balance, pain, activity tolerance, high fall risk impairments that are limiting ability to complete her usual ADLs, IADLs, community participation,  care for others, manage her home, bend, lift, ambulate community distances and complete transfers without difficulty or falling. Patient will benefit from continued skilled physical therapy intervention to address current body structure impairments and activity limitations to improve function and work towards goals set in current POC in order to return to prior level of function or maximal functional improvement.    Personal Factors and Comorbidities  Age;Time since onset of injury/illness/exacerbation;Comorbidity 3+;Fitness    Comorbidities  SOB with exertion; shingles (2020), chronic kidney disease (between stage III-IV according to pt), diabetes (last a1c around 7), stomach polyp that was recently surgically repaired, obesity, Hx of B rotator cuff tears (with repairs), history of 2 strokes, pacemaker with revision(properly functioning per pt 05/29/2019), achy joints, bilateral knee replacement, gout, dypsnea, acid reflux, CHF, GI bleed, hypertension, afib w/history of cardioversion, osteoporosis, repeated hospitalization with decreased endurance and functional mobility, wide range of allergies. Recent surgical procedures (in 2019): cardioversion, esophagogastroduendoscopy with propofol (x2), back surgery (pt describes kyphoplasty at 2 levels, and she mentioned prior fusion), eye surgery, diabetes, no seizures. See chart for extensive past medical history.  Examination-Activity Limitations  Dressing;Sleep;Sit;Carry;Bathing;Squat;Bed Mobility;Hygiene/Grooming;Lift;Stairs;Bend;Stand;Caring for Others;Reach Overhead;Transfers;Locomotion Level;Toileting;Continence    Examination-Participation Restrictions  Cleaning;Shop;Laundry;Community Activity;Meal Prep;Yard Work;Driving;Interpersonal Relationship    Stability/Clinical Decision Making  Evolving/Moderate complexity    Rehab Potential  Fair    PT Frequency  2x / week    PT Duration  12 weeks    PT Treatment/Interventions  Cryotherapy;ADLs/Self Care Home  Management;Moist Heat;Gait training;Stair training;Functional mobility training;Therapeutic activities;Therapeutic exercise;Balance training;Neuromuscular re-education;Wheelchair mobility training;Patient/family education;Passive range of motion;Manual techniques;Dry needling;Energy conservation;Aquatic Therapy;Other (comment)   Joint mobilizations grades I-IV   PT Next Visit Plan  UE/LE strengthening, ROM, trunk extensor strengthening    PT Home Exercise Plan  Medbridge: [id number] and Agree with Plan of Care  Patient       Patient will benefit from skilled therapeutic intervention in order to improve the following deficits and impairments:  Abnormal gait, Decreased mobility, Decreased coordination, Increased muscle spasms, Postural dysfunction, Pain, Improper body mechanics, Decreased activity tolerance, Decreased endurance, Decreased range of motion, Decreased strength, Hypomobility, Impaired perceived functional ability, Impaired UE functional use, Decreased balance, Difficulty walking, Decreased safety awareness, Impaired flexibility, Obesity  Visit Diagnosis: Pain in thoracic spine  Chronic midline low back pain without sciatica  Difficulty in walking, not elsewhere classified  Muscle weakness (generalized)  Acute pain of left shoulder     Problem List Patient Active Problem List   Diagnosis Date Noted  . RUQ pain   . UTI (urinary tract infection) 06/14/2018  . Type 2 diabetes mellitus with neurological complications ([place]) 99991111  . Acute respiratory failure ([place]) 04/09/2018  . Chronic anticoagulation 04/09/2018  . Pacemaker 04/09/2018  . H/O TIA (transient ischemic attack) and stroke 04/09/2018  . CAD (coronary artery disease) 04/09/2018  . Hematemesis 04/05/2018  . Hypotension 02/04/2018  . Atrial fibrillation with RVR ([place]) 02/04/2018  . Atrial flutter ([place]) 08/24/2017  . Sinus node dysfunction ([place]) 08/24/2017  . Dizziness 06/12/2017  . Mastalgia  02/14/2017  . Acute on chronic renal insufficiency 01/09/2017  . Fatty liver 01/09/2017  . Benign essential HTN 09/08/2016  . Nephrolithiasis 02/22/2016  . Diabetic neuropathy with neurologic complication ([place]) [id number]  . Moderate episode of recurrent major depressive disorder ([place]) 08/11/2015  . Gastritis without bleeding   . Gastric polyp   . Gastro-esophageal reflux disease without esophagitis   . PAF (paroxysmal atrial fibrillation) ([place]) 06/29/2015  . Intestinal obstruction ([place]) 02/20/2015  . DDD (degenerative disc disease), lumbar 01/19/2015  . Back pain 01/12/2015  . Gout 01/12/2015  . Complete tear of left rotator cuff 10/16/2014  . Intervertebral disc disorder with radiculopathy of lumbar region 09/17/2014  . Lumbar radiculitis 09/17/2014  . Lumbar stenosis with neurogenic claudication 09/17/2014  . Neuritis or radiculitis due to rupture of lumbar intervertebral disc 09/17/2014  . Atherosclerosis of both carotid arteries 07/15/2014  . Essential hypertension 07/15/2014  . Hyperlipemia, mixed 07/15/2014  . 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 Body mass index (BMI) of 40.0-44.9 in adult ([place]) 06/21/2014  . Mixed incontinence 06/25/2013  . Mixed urge and stress incontinence 06/25/2013  . Nocturia 06/25/2013  . Blurring of visual image 04/11/2012   Sherrilyn Rist, SPT 07/03/19, 5:26 PM  Everlean Alstrom. Graylon Good, PT, DPT 07/03/19, 5:35 PM  [facility] [address] [place], [address] Phone: [phone]   Fax:  [phone]  Name: Deborah Obrien MRN: [id number] Date of Birth: [birth year]

## 2019-07-04 ENCOUNTER — Encounter: Payer: Medicare Other | Admitting: Physical Therapy

## 2019-07-05 ENCOUNTER — Other Ambulatory Visit: Payer: Medicare Other

## 2019-07-08 ENCOUNTER — Other Ambulatory Visit: Payer: Self-pay

## 2019-07-08 ENCOUNTER — Ambulatory Visit: Payer: Medicare Other | Admitting: Physical Therapy

## 2019-07-08 ENCOUNTER — Encounter: Payer: Self-pay | Admitting: Physical Therapy

## 2019-07-08 DIAGNOSIS — M546 Pain in thoracic spine: Secondary | ICD-10-CM

## 2019-07-08 DIAGNOSIS — M545 Low back pain, unspecified: Secondary | ICD-10-CM

## 2019-07-08 DIAGNOSIS — R262 Difficulty in walking, not elsewhere classified: Secondary | ICD-10-CM

## 2019-07-08 DIAGNOSIS — M25512 Pain in left shoulder: Secondary | ICD-10-CM

## 2019-07-08 DIAGNOSIS — G8929 Other chronic pain: Secondary | ICD-10-CM

## 2019-07-08 DIAGNOSIS — M6281 Muscle weakness (generalized): Secondary | ICD-10-CM

## 2019-07-08 NOTE — Therapy (Signed)
[facility] [address] [place], [address] Phone: [phone]   Fax:  [phone]  Physical Therapy Treatment  Patient Details  Name: Deborah Obrien MRN: [id number] Date of Birth: 1941/05/24 Referring Provider (PT): Eugene Garnet, MD   Encounter Date: 07/08/2019  PT End of Session - 07/08/19 1653    Visit Number  11    Number of Visits  24    Date for PT Re-Evaluation  08/21/19    Authorization Type  Primary Medicare; secondary BCBS reporting from 05/29/2019    Authorization Time Period  Current certification: [id number] to 08/21/2019    Authorization - Visit Number  1    Authorization - Number of Visits  10    PT Start Time  [id number]    PT Stop Time  1730    PT Time Calculation (min)  45 min    Equipment Utilized During Treatment  Gait belt    Activity Tolerance  Patient limited by pain;Patient tolerated treatment well;Patient limited by fatigue    Behavior During Therapy  [facility] for tasks assessed/performed       Past Medical History:  Diagnosis Date  . A-fib ([place]) 2016  . Acid reflux   . Anxiety   . Bowel obstruction ([place])   . CHF (congestive heart failure) ([place]) 2017  . Chronic kidney disease (CKD) stage G3a/A1, moderately decreased glomerular filtration rate (GFR) between 45-59 mL/min/1.73 square meter and albuminuria creatinine ratio less than 30 mg/g   . Diabetes mellitus without complication ([place])   . Dyspnea   . Fatty liver   . GI bleed   . Gout   . Hypertension   . Morbid obesity ([place])   . Osteoarthritis   . Osteoporosis   . Paroxysmal atrial fibrillation (HCC)   . Presence of permanent cardiac pacemaker   . Sinus node dysfunction ([place]) 08/24/2017  . TIA (transient ischemic attack)     Past Surgical History:  Procedure Laterality Date  . ABDOMINAL HYSTERECTOMY  1980  . APPENDECTOMY    . BACK SURGERY     2008  . BACK SURGERY  2019   patient describes kyphoplasty for compression  fractures, MD referral said fusion  . BREAST BIOPSY Right 08/16   stereo fibroadenomatous change, neg for atypia  . BREAST EXCISIONAL BIOPSY Left    neg  . CARDIAC CATHETERIZATION Left 08/25/2016   Procedure: Left Heart Cath and Coronary Angiography;  Surgeon: Corey Skains, MD;  Location: [place] [facility];  Service: Cardiovascular;  Laterality: Left;  . CARDIOVERSION N/A 06/28/2017   Procedure: CARDIOVERSION;  Surgeon: Corey Skains, MD;  Location: [facility];  Service: Cardiovascular;  Laterality: N/A;  . CARDIOVERSION N/A 02/06/2018   Procedure: CARDIOVERSION;  Surgeon: Josue Hector, MD;  Location: [place];  Service: Cardiovascular;  Laterality: N/A;  . CATARACT EXTRACTION, BILATERAL  2012  . CHOLECYSTECTOMY    . colon blockage  1999  . COLON SURGERY  1999  . COLONOSCOPY  2016   polyps removed 2016  . DORSAL COMPARTMENT RELEASE Left 09/14/2016   Procedure: RELEASE DORSAL COMPARTMENT (DEQUERVAIN);  Surgeon: Dereck Leep, MD;  Location: [facility];  Service: Orthopedics;  Laterality: Left;  . ESOPHAGOGASTRODUODENOSCOPY N/A 01/27/2015   Procedure: ESOPHAGOGASTRODUODENOSCOPY (EGD);  Surgeon: Lollie Sails, MD;  Location: [facility];  Service: Endoscopy;  Laterality: N/A;  . ESOPHAGOGASTRODUODENOSCOPY (EGD) WITH PROPOFOL N/A 07/24/2015   Procedure: ESOPHAGOGASTRODUODENOSCOPY (EGD) WITH PROPOFOL;  Surgeon: Lucilla Lame, MD;  Location: [facility] ENDOSCOPY;  Service: Endoscopy;  Laterality: N/A;  . ESOPHAGOGASTRODUODENOSCOPY (EGD) WITH PROPOFOL N/A 04/12/2018   Procedure: ESOPHAGOGASTRODUODENOSCOPY (EGD) WITH PROPOFOL;  Surgeon: Jonathon Bellows, MD;  Location[facility] END[facility]COPY;  Service: Gastroenterology;  Laterality: N/A;  . ESOPHAGOGASTRODUODENOSCOPY (EGD) WITH PROPOFOL N/A 06/22/2018   Procedure: ESOPHAGOGASTRODUODENOSCOPY (EGD) WITH PROPOFOL;  Surgeon: [name], MD;  Location: [facility] ENDOSCOPY;  Service: Endoscopy;  Laterality: N/A;  . ESOPHAGOGASTRODUODENOSCOPY (EGD) WITH  PROPOFOL N/A 07/09/2018   Procedure: ESOPHAGOGASTRODUODENOSCOPY (EGD) WITH PROPOFOL with resection of gastric polyps;  Surgeon: Jonathon Bellows, MD;  Location: [facility] ENDOSCOPY;  Service: Gastroenterology;  Laterality: N/A;  . ESOPHAGOGASTRODUODENOSCOPY (EGD) WITH PROPOFOL N/A 05/17/2019   Procedure: ESOPHAGOGASTRODUODENOSCOPY (EGD) WITH PROPOFOL;  Surgeon: Jonathon Bellows, MD;  Location[facility]COPY;  Service: Gastroenterology;  Laterality: N/A;  . EUS N/A 06/22/2018   Procedure: UPPER ENDOSCOPIC ULTRASOUND (EUS) RADIAL;  Surgeon: [name], MD;  Location: [facility] ENDOSCOPY;  Service: Endoscopy;  Laterality: N/A;  . JOINT REPLACEMENT  2014   Bilateral Knee replacement  . LEAD REVISION/REPAIR N/A 08/29/2017   Procedure: LEAD REVISION/REPAIR;  Surgeon: Constance Haw, MD;  Location: [place] [facility];  Service: Cardiovascular;  Laterality: N/A;  . OOPHORECTOMY    . PACEMAKER INSERTION Left 07/01/2015   Procedure: INSERTION PACEMAKER;  Surgeon: Isaias Cowman, MD;  Location: [facility];  Service: Cardiovascular;  Laterality: Left;  . PACEMAKER REVISION N/A 08/28/2017   Procedure: PACEMAKER REVISION;  Surgeon: Deboraha Sprang, MD;  Location: [place] [facility];  Service: Cardiovascular;  Laterality: N/A;  . REPLACEMENT TOTAL KNEE BILATERAL    . SHOULDER ARTHROSCOPY WITH SUBACROMIAL DECOMPRESSION Left 2013  . TEE WITHOUT CARDIOVERSION N/A 06/28/2017   Procedure: TRANSESOPHAGEAL ECHOCARDIOGRAM (TEE);  Surgeon: Corey Skains, MD;  Location: [facility];  Service: Cardiovascular;  Laterality: N/A;    There were no vitals filed for this visit.  Subjective Assessment - 07/08/19 1648    Subjective  Patient reports she is feeling pretty good today. She states she feels like her function is up to 5-6/10 if 10/10 is her prior level of function and 0 is unable to do anything. She was active this weekend, cooking a lot and wrapping presents. She was pretty sore and stooped over from all the activity for  a couple of days but is feeling better now. Her dog is having trouble with some wounds and recovery requiring them to take the dog back to the doctor recently, so she is a bit worried about that. She reports no pain in her back upon arrival today. She is having a lot of trouble with her L shoulder and UE still waiting to see the doctor for that (has been recheduled to next week).    Patient is accompained by:  Family member   [name]   Pertinent History  Patient is a 78 y.o. female who presents to outpatient physical therapy with a referral for secondary kyphosis of thoracic region. This patient's chief complaints consist of pain, inability to stand up straight, poor activity tolerance following recently rib fracture at 7th rib at R 04/02/2019. Patient also has a fall history that being dropped at rehab facility leading to 2 compression fractures that required kyphoplasty in late 2019. The above history leading to the following functional deficits: difficulty with ADLs, IADLs, bed mobility, transfers, household and community mobility, social and community participation.  Relevant past medical history and comorbidities include SOB with exertion; shingles (2020), chronic kidney disease (between stage III-IV according to pt),  diabetes (last a1c around 7), stomach polyp that was recently surgically repaired, obesity, Hx of B rotator cuff tears (with repairs), history of 2 strokes, pacemaker with revision(properly functioning per pt 05/29/2019), achy joints, bilateral knee replacement, gout, dypsnea, acid reflux, CHF, GI bleed, hypertension, afib w/history of cardioversion, osteoporosis, repeated hospitalization with decreased endurance and functional mobility, wide range of allergies. Recent surgical procedures (in 2019): cardioversion, esophagogastroduendoscopy with propofol (x2), back surgery (pt describes kyphoplasty at 2 levels, and she mentioned prior fusion), eye surgery, diabetes, no seizures. See chart  for extensive past medical history.    Limitations  Sitting;Lifting;Standing;Walking;House hold activities    How long can you sit comfortably?  30 min    How long can you stand comfortably?  1-1.5 minutes    How long can you walk comfortably?  less than 20 feet    Diagnostic tests  04/02/2019 CI chest IMPRESSION: Mildly displaced right seventh rib fracture. Scheduled bone density exam on 06/07/2019    Patient Stated Goals  Walking longer distance, able to play with her grandchildren as well a reduce of her back pain.    Currently in Pain?  No/denies    Pain Onset  More than a month ago       Bloodglucose: patient self-reported259mmol/Lthis morning.   TREATMENT:  Pacemaker  Therapeutic exercise:to centralize symptoms and improve ROM, strength, muscular endurance, and activity tolerance required for successful completion of functional activities. -NuSteplevel3-4withusing bilateral lower extremities.(BLEs only)Setting9. For improved extremity mobility, muscular endurance, and activity tolerance; and to induce the analgesic effect of aerobic exercise, stimulate improved joint nutrition, and prepare body structures and systems for followinginterventions.71minutes.92 spm, 0.9mile. - rows at cable x 10 (5#). No back support to improve trunk and extensor strength. x10 in sitting, x10 in standing with CGA for safety. Denies aggravation of pain in shoulder.  - seated marching 39mins with 2lb ankle weight, cuing for abdominal brace, no external trunk or UE support, high lift of feet.  Frequent cuing to activate core and glute muscles.  Therapeutic activities: for functional strengthening and improved functional activity tolerance. - sit <> stand with hands on hips from 22 inch platform (nustep).  - sit <> stand with hands on hips from 20 inches (green chair with airex on it) x 10 - sit <> stand with hands on hips from 18 inch surface (green chair) x 10 (very difficult),  feeling weak in thighs.  - step up to 4 inch step with unilateral UE support x10 each side. CGA for safety. Cuing to stand up tall and maintain good posture.  - 6 inch hurdles x6 step together leading with each foot, x 6 each step over step. Unilateral UE support and CGA for safety. One trip and able to recover with HHA.  - Ambulation outside on sidewalk x200 feet and x170 feet using rollator, with self selected break in between ambulation. SBA - CGA for safety. .2# ankle weights on. Cuing for improved posture, encouragement to continue.     HOME EXERCISE PROGRAM Access Code: [email or web address]  URL: [email or web address]  Date: 06/03/2019  Prepared by: Rosita Kea   Exercises  Neck Sidebending - 5 reps - 30 seconds hold - 2x daily - 7x weekly  Standing Cervical Rotation AROM - 5 reps - 30 seconds hold - 2x daily - 7x weekly  Seated Shoulder Flexion - 3 sets - 10 reps - 2x daily - 7x weekly  Seated Thoracic Lumbar Extension - 3 sets - 10 reps - 2x daily - 7x weekly  Seated Shoulder Row with Anchored Resistance - 3 sets - 10 reps - 2x daily - 7x weekly  Patient tolerated treatment well today and continues to make progress towards goals. Was able to advance to more taxing functional exercises with no pain in back produced until 30 min into appointment, when she had mild pain that returned to baseline with seated rest. Patient continues to have activity tolerance, pain, posture, balance, and muscle performance limitations that limit her ability to complete ADLs, IADLs, and basic mobility. Patient required multimodal cuing to improve form and ensure safety during session. Patient would benefit from continued physical therapy to address remaining impairments and functional limitations to work towards stated goals and return to PLOF or maximal functional independence.     PT Education - 07/08/19 1653    Education Details  Exercise purpose/form. Purpose of the session.    Person(s)  Educated  Patient    Methods  Explanation;Demonstration;Tactile cues;Verbal cues    Comprehension  Verbalized understanding;Returned demonstration       PT Short Term Goals - 07/03/19 1152      PT SHORT TERM GOAL #1   Title  Be independent with intial home exercise program completed at least 3 times per week for self-management of symptoms.    Baseline  initial HEP provided at IE (09/16);    Time  4    Period  Weeks    Status  Achieved    Target Date  06/26/19        PT Long Term Goals - 07/03/19 1153      PT LONG TERM GOAL #1   Title  Be independent with a long-term home exercise program for self-management of symptoms.    Baseline  initial HEP provided at IE (05/29/2019); Continue progress HEP (07/03/2019)    Time  12    Period  Weeks    Status  On-going    Target Date  08/21/19      PT LONG TERM GOAL #2   Title  Patient will be able to ambulate > 200 feet with LRAD for improved community mobility    Baseline  20 feet (05/29/2019); 163 feet with SBA  (07/03/2019)    Time  8    Period  Weeks    Status  Revised    Target Date  08/21/19      PT LONG TERM GOAL #3   Title  Reduce pain with functional activities to equal or less than 3/10 to allow patient to be more independent with usual activities including ADLs, IADLs, and social engagement with less difficulty.     Baseline  10/10 (05/29/2019); 4/10 (07/03/2019)    Time  8    Period  Weeks    Status  On-going    Target Date  07/24/19      PT LONG TERM GOAL #4   Title  Be able to complete 5 sit to stand test in 15 seconds to improve functional mobility for transfers and community participation.    Baseline  37.5s for 5 STS (05/29/2019); 20s with BUEs support at thigh (07/03/2019)    Time  8    Period  Weeks    Status  Revised    Target Date  07/24/19      PT LONG TERM GOAL #5   Title  Patient will be able to complete 10 feet TUG within 20s to improve fuctional mobility for transfers and community participation.     Baseline  63s (05/29/2019); 27 seconds  with rollator (07/03/2019)    Time  12    Period  Weeks    Status  Revised    Target Date  08/21/19      PT LONG TERM GOAL #6   Title  Demonstrate improved FOTO score by 10 units to demonstrate improvement in overall condition and self-reported functional ability.    Baseline  FOTO 30; MCD 10 (05/29/2019); Deferred 06/24/2019    Time  12    Period  Weeks    Status  New    Target Date  08/21/19            Plan - 07/08/19 1744    Clinical Impression Statement  Patient tolerated treatment well today and continues to make progress towards goals. Was able to advance to more taxing functional exercises with no pain in back produced until 30 min into appointment, when she had mild pain that returned to baseline with seated rest. Patient continues to have activity tolerance, pain, posture, balance, and muscle performance limitations that limit her ability to complete ADLs, IADLs, and basic mobility. Patient required multimodal cuing to improve form and ensure safety during session. Patient would benefit from continued physical therapy to address remaining impairments and functional limitations to work towards stated goals and return to PLOF or maximal functional independence.    Personal Factors and Comorbidities  Age;Time since onset of injury/illness/exacerbation;Comorbidity 3+;Fitness    Comorbidities  SOB with exertion; shingles (2020), chronic kidney disease (between stage III-IV according to pt), diabetes (last a1c around 7), stomach polyp that was recently surgically repaired, obesity, Hx of B rotator cuff tears (with repairs), history of 2 strokes, pacemaker with revision(properly functioning per pt 05/29/2019), achy joints, bilateral knee replacement, gout, dypsnea, acid reflux, CHF, GI bleed, hypertension, afib w/history of cardioversion, osteoporosis, repeated hospitalization with decreased endurance and functional mobility, wide range of  allergies. Recent surgical procedures (in 2019): cardioversion, esophagogastroduendoscopy with propofol (x2), back surgery (pt describes kyphoplasty at 2 levels, and she mentioned prior fusion), eye surgery, diabetes, no seizures. See chart for extensive past medical history.    Examination-Activity Limitations  Dressing;Sleep;Sit;Carry;Bathing;Squat;Bed Mobility;Hygiene/Grooming;Lift;Stairs;Bend;Stand;Caring for Others;Reach Overhead;Transfers;Locomotion Level;Toileting;Continence    Examination-Participation Restrictions  Cleaning;Shop;Laundry;Community Activity;Meal Prep;Yard Work;Driving;Interpersonal Relationship    Stability/Clinical Decision Making  Evolving/Moderate complexity    Rehab Potential  Fair    PT Frequency  2x / week    PT Duration  12 weeks    PT Treatment/Interventions  Cryotherapy;ADLs/Self Care Home Management;Moist Heat;Gait training;Stair training;Functional mobility training;Therapeutic activities;Therapeutic exercise;Balance training;Neuromuscular re-education;Wheelchair mobility training;Patient/family education;Passive range of motion;Manual techniques;Dry needling;Energy conservation;Aquatic Therapy;Other (comment)   Joint mobilizations grades I-IV   PT Next Visit Plan  UE/LE strengthening, ROM, trunk extensor strengthening    PT Home Exercise Plan  Medbridge: [id number] and Agree with Plan of Care  Patient       Patient will benefit from skilled therapeutic intervention in order to improve the following deficits and impairments:  Abnormal gait, Decreased mobility, Decreased coordination, Increased muscle spasms, Postural dysfunction, Pain, Improper body mechanics, Decreased activity tolerance, Decreased endurance, Decreased range of motion, Decreased strength, Hypomobility, Impaired perceived functional ability, Impaired UE functional use, Decreased balance, Difficulty walking, Decreased safety awareness, Impaired flexibility, Obesity  Visit Diagnosis: Pain  in thoracic spine  Chronic midline low back pain without sciatica  Difficulty in walking, not elsewhere classified  Muscle weakness (generalized)  Acute pain of left shoulder     Problem List Patient Active Problem List   Diagnosis Date Noted  .  RUQ pain   . UTI (urinary tract infection) 06/14/2018  . Type 2 diabetes mellitus with neurological complications ([place]) 99991111  . Acute respiratory failure ([place]) 04/09/2018  . Chronic anticoagulation 04/09/2018  . Pacemaker 04/09/2018  . H/O TIA (transient ischemic attack) and stroke 04/09/2018  . CAD (coronary artery disease) 04/09/2018  . Hematemesis 04/05/2018  . Hypotension 02/04/2018  . Atrial fibrillation with RVR ([place]) 02/04/2018  . Atrial flutter ([place]) 08/24/2017  . Sinus node dysfunction ([place]) 08/24/2017  . Dizziness 06/12/2017  . Mastalgia 02/14/2017  . Acute on chronic renal insufficiency 01/09/2017  . Fatty liver 01/09/2017  . Benign essential HTN 09/08/2016  . Nephrolithiasis 02/22/2016  . Diabetic neuropathy with neurologic complication ([place]) [id number]  . Moderate episode of recurrent major depressive disorder ([place]) 08/11/2015  . Gastritis without bleeding   . Gastric polyp   . Gastro-esophageal reflux disease without esophagitis   . PAF (paroxysmal atrial fibrillation) ([place]) 06/29/2015  . Intestinal obstruction ([place]) 02/20/2015  . DDD (degenerative disc disease), lumbar 01/19/2015  . Back pain 01/12/2015  . Gout 01/12/2015  . Complete tear of left rotator cuff 10/16/2014  . Intervertebral disc disorder with radiculopathy of lumbar region 09/17/2014  . Lumbar radiculitis 09/17/2014  . Lumbar stenosis with neurogenic claudication 09/17/2014  . Neuritis or radiculitis due to rupture of lumbar intervertebral disc 09/17/2014  . Atherosclerosis of both carotid arteries 07/15/2014  . Essential hypertension 07/15/2014  . Hyperlipemia, mixed 07/15/2014  . 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 Body mass  index (BMI) of 40.0-44.9 in adult ([place]) 06/21/2014  . Mixed incontinence 06/25/2013  . Mixed urge and stress incontinence 06/25/2013  . Nocturia 06/25/2013  . Blurring of visual image 04/11/2012    Everlean Alstrom. Graylon Good, PT, DPT 07/08/19, 5:45 PM  [facility] [address] [place], [address] Phone: [phone]   Fax:  [phone]  Name: ARAIA LEDOUX MRN: [id number] Date of Birth: 21-Jan-1941

## 2019-07-09 ENCOUNTER — Encounter: Payer: Medicare Other | Admitting: Physical Therapy

## 2019-07-10 ENCOUNTER — Ambulatory Visit: Payer: Medicare Other | Admitting: Physical Therapy

## 2019-07-11 ENCOUNTER — Encounter: Payer: Medicare Other | Admitting: Physical Therapy

## 2019-07-16 ENCOUNTER — Encounter: Payer: Self-pay | Admitting: Physical Therapy

## 2019-07-16 ENCOUNTER — Ambulatory Visit: Payer: Medicare Other | Attending: Orthopaedic Surgery | Admitting: Physical Therapy

## 2019-07-16 ENCOUNTER — Other Ambulatory Visit: Payer: Self-pay

## 2019-07-16 DIAGNOSIS — M546 Pain in thoracic spine: Secondary | ICD-10-CM

## 2019-07-16 DIAGNOSIS — M545 Low back pain, unspecified: Secondary | ICD-10-CM

## 2019-07-16 DIAGNOSIS — M6281 Muscle weakness (generalized): Secondary | ICD-10-CM | POA: Diagnosis present

## 2019-07-16 DIAGNOSIS — R262 Difficulty in walking, not elsewhere classified: Secondary | ICD-10-CM | POA: Diagnosis present

## 2019-07-16 DIAGNOSIS — M25512 Pain in left shoulder: Secondary | ICD-10-CM | POA: Insufficient documentation

## 2019-07-16 DIAGNOSIS — G8929 Other chronic pain: Secondary | ICD-10-CM | POA: Diagnosis present

## 2019-07-16 NOTE — Therapy (Signed)
[facility] [address] [place], [address] Phone: [phone]   Fax:  [phone]  Physical Therapy Treatment  Patient Details  Name: Deborah Obrien MRN: [id number] Date of Birth: November 21, 1940 Referring Provider (PT): Eugene Garnet, MD   Encounter Date: 07/16/2019  PT End of Session - 07/16/19 1528    Visit Number  12    Number of Visits  24    Date for PT Re-Evaluation  08/21/19    Authorization Type  Primary Medicare; secondary BCBS reporting from 05/29/2019    Authorization Time Period  Current certification: [id number] to 08/21/2019    Authorization - Visit Number  2    Authorization - Number of Visits  10    PT Start Time  [id number]    PT Stop Time  1516    PT Time Calculation (min)  44 min    Equipment Utilized During Treatment  Gait belt    Activity Tolerance  Patient limited by pain;Patient tolerated treatment well;Patient limited by fatigue    Behavior During Therapy  [facility] for tasks assessed/performed       Past Medical History:  Diagnosis Date  . A-fib ([place]) 2016  . Acid reflux   . Anxiety   . Bowel obstruction ([place])   . CHF (congestive heart failure) ([place]) 2017  . Chronic kidney disease (CKD) stage G3a/A1, moderately decreased glomerular filtration rate (GFR) between 45-59 mL/min/1.73 square meter and albuminuria creatinine ratio less than 30 mg/g   . Diabetes mellitus without complication ([place])   . Dyspnea   . Fatty liver   . GI bleed   . Gout   . Hypertension   . Morbid obesity ([place])   . Osteoarthritis   . Osteoporosis   . Paroxysmal atrial fibrillation (HCC)   . Presence of permanent cardiac pacemaker   . Sinus node dysfunction ([place]) 08/24/2017  . TIA (transient ischemic attack)     Past Surgical History:  Procedure Laterality Date  . ABDOMINAL HYSTERECTOMY  1980  . APPENDECTOMY    . BACK SURGERY     2008  . BACK SURGERY  2019   patient describes kyphoplasty for compression  fractures, MD referral said fusion  . BREAST BIOPSY Right 08/16   stereo fibroadenomatous change, neg for atypia  . BREAST EXCISIONAL BIOPSY Left    neg  . CARDIAC CATHETERIZATION Left 08/25/2016   Procedure: Left Heart Cath and Coronary Angiography;  Surgeon: Corey Skains, MD;  Location: [facility];  Service: Cardiovascular;  Laterality: Left;  . CARDIOVERSION N/A 06/28/2017   Procedure: CARDIOVERSION;  Surgeon: Corey Skains, MD;  Location: [facility];  Service: Cardiovascular;  Laterality: N/A;  . CARDIOVERSION N/A 02/06/2018   Procedure: CARDIOVERSION;  Surgeon: Josue Hector, MD;  Location: [place] [facility];  Service: Cardiovascular;  Laterality: N/A;  . CATARACT EXTRACTION, BILATERAL  2012  . CHOLECYSTECTOMY    . colon blockage  1999  . COLON SURGERY  1999  . COLONOSCOPY  2016   polyps removed 2016  . DORSAL COMPARTMENT RELEASE Left 09/14/2016   Procedure: RELEASE DORSAL COMPARTMENT (DEQUERVAIN);  Surgeon: Dereck Leep, MD;  Location: [facility];  Service: Orthopedics;  Laterality: Left;  . ESOPHAGOGASTRODUODENOSCOPY N/A 01/27/2015   Procedure: ESOPHAGOGASTRODUODENOSCOPY (EGD);  Surgeon: Lollie Sails, MD;  Location: [facility] ENDOSCOPY;  Service: Endoscopy;  Laterality: N/A;  . ESOPHAGOGASTRODUODENOSCOPY (EGD) WITH PROPOFOL N/A 07/24/2015   Procedure: ESOPHAGOGASTRODUODENOSCOPY (EGD) WITH PROPOFOL;  Surgeon: Lucilla Lame, MD;  Location: [facility] ENDOSCOPY;  Service: Endoscopy;  Laterality: N/A;  . ESOPHAGOGASTRODUODENOSCOPY (EGD) WITH PROPOFOL N/A 04/12/2018   Procedure: ESOPHAGOGASTRODUODENOSCOPY (EGD) WITH PROPOFOL;  Surgeon: Jonathon Bellows, MD;  Location: [facility] ENDOSCOPY;  Service: Gastroenterology;  Laterality: N/A;  . ESOPHAGOGASTRODUODENOSCOPY (EGD) WITH PROPOFOL N/A 06/22/2018   Procedure: ESOPHAGOGASTRODUODENOSCOPY (EGD) WITH PROPOFOL;  Surgeon: [name], MD;  Location: [facility];  Service: Endoscopy;  Laterality: N/A;  . ESOPHAGOGASTRODUODENOSCOPY (EGD) WITH  PROPOFOL N/A 07/09/2018   Procedure: ESOPHAGOGASTRODUODENOSCOPY (EGD) WITH PROPOFOL with resection of gastric polyps;  Surgeon: Jonathon Bellows, MD;  Location: [facility] ENDOSCOPY;  Service: Gastroenterology;  Laterality: N/A;  . ESOPHAGOGASTRODUODENOSCOPY (EGD) WITH PROPOFOL N/A 05/17/2019   Procedure: ESOPHAGOGASTRODUODENOSCOPY (EGD) WITH PROPOFOL;  Surgeon: Jonathon Bellows, MD;  Location: [facility] END[facility];  Service: Gastroenterology;  Laterality: N/A;  . EUS N/A 06/22/2018   Procedure: UPPER ENDOSCOPIC ULTRASOUND (EUS) RADIAL;  Surgeon: [name], MD;  Location: [facility] ENDOSCOPY;  Service: Endoscopy;  Laterality: N/A;  . JOINT REPLACEMENT  2014   Bilateral Knee replacement  . LEAD REVISION/REPAIR N/A 08/29/2017   Procedure: LEAD REVISION/REPAIR;  Surgeon: Constance Haw, MD;  Location: [facility] CV LAB;  Service: Cardiovascular;  Laterality: N/A;  . OOPHORECTOMY    . PACEMAKER INSERTION Left 07/01/2015   Procedure: INSERTION PACEMAKER;  Surgeon: Isaias Cowman, MD;  Location: [facility];  Service: Cardiovascular;  Laterality: Left;  . PACEMAKER REVISION N/A 08/28/2017   Procedure: PACEMAKER REVISION;  Surgeon: Deboraha Sprang, MD;  Location: [facility];  Service: Cardiovascular;  Laterality: N/A;  . REPLACEMENT TOTAL KNEE BILATERAL    . SHOULDER ARTHROSCOPY WITH SUBACROMIAL DECOMPRESSION Left 2013  . TEE WITHOUT CARDIOVERSION N/A 06/28/2017   Procedure: TRANSESOPHAGEAL ECHOCARDIOGRAM (TEE);  Surgeon: Corey Skains, MD;  Location: [facility] OR[facility];  Service: Cardiovascular;  Laterality: N/A;    There were no vitals filed for this visit.  Subjective Assessment - 07/16/19 1440    Subjective  Patient reports she felt pretty good today but pain still at her upper and lower back and rated as 5/10. She concerned for her L shoulder pain and asked to not working on her L shoulder and upper back.    Patient is accompained by:  Family member   husband Deborah Obrien   Pertinent History  Patient is a 78  y.o. female who presents to outpatient physical therapy with a referral for secondary kyphosis of thoracic region. This patient's chief complaints consist of pain, inability to stand up straight, poor activity tolerance following recently rib fracture at 7th rib at R 04/02/2019. Patient also has a fall history that being dropped at rehab facility leading to 2 compression fractures that required kyphoplasty in late 2019. The above history leading to the following functional deficits: difficulty with ADLs, IADLs, bed mobility, transfers, household and community mobility, social and community participation.  Relevant past medical history and comorbidities include SOB with exertion; shingles (2020), chronic kidney disease (between stage III-IV according to pt), diabetes (last a1c around 7), stomach polyp that was recently surgically repaired, obesity, Hx of B rotator cuff tears (with repairs), history of 2 strokes, pacemaker with revision(properly functioning per pt 05/29/2019), achy joints, bilateral knee replacement, gout, dypsnea, acid reflux, CHF, GI bleed, hypertension, afib w/history of cardioversion, osteoporosis, repeated hospitalization with decreased endurance and functional mobility, wide range of allergies. Recent surgical procedures (in 2019): cardioversion, esophagogastroduendoscopy with propofol (x2), back surgery (pt describes kyphoplasty at 2 levels, and she mentioned prior fusion), eye surgery, diabetes, no seizures. See chart for  extensive past medical history.    Limitations  Sitting;Lifting;Standing;Walking;House hold activities    How long can you sit comfortably?  30 min    How long can you stand comfortably?  1-1.5 minutes    How long can you walk comfortably?  less than 20 feet    Diagnostic tests  04/02/2019 CI chest IMPRESSION: Mildly displaced right seventh rib fracture. Scheduled bone density exam on 06/07/2019    Patient Stated Goals  Walking longer distance, able to play with her  grandchildren as well a reduce of her back pain.    Currently in Pain?  Yes    Pain Score  5     Pain Onset  More than a month ago       Bloodglucose: patient self-reported1107mmol/Lthis morning.   TREATMENT:  Pacemaker  Therapeutic exercise:to centralize symptoms and improve ROM, strength, muscular endurance, and activity tolerance required for successful completion of functional activities. -NuSteplevel4withusing bilateral lower extremities.(BLEs only)Setting8. For improved extremity mobility, muscular endurance, and activity tolerance; and to induce the analgesic effect of aerobic exercise, stimulate improved joint nutrition, and prepare body structures and systems for followinginterventions.28minutes.90 spm, 0.11mile. - Seated straight leg raise 2x10 each side with 2lb ankle weight. Hold for 4s each  - Seated marching 25mins with 2lb ankle weight, cuing for abdominal brace. Frequent cuing to activate core and glute muscles. - Multiple resting breaks requested by patient throughout the session due to tiredness from exercises.   Therapeutic activities: for functional strengthening and improved functional activity tolerance. - Half sit down with hands holding on pink ball from chair 3x 10 reps - Ambulation outside on sidewalk x200 feet using rollator from clinic to bathroom. SBA - CGA for safety. .2# ankle weights on.Cuing for improved posture, encouragement to continue.     HOME EXERCISE PROGRAM Access Code: [email or web address]  URL: [email or web address]  Date: 06/03/2019  Prepared by: Rosita Kea   Exercises  Neck Sidebending - 5 reps - 30 seconds hold - 2x daily - 7x weekly  Standing Cervical Rotation AROM - 5 reps - 30 seconds hold - 2x daily - 7x weekly  Seated Shoulder Flexion - 3 sets - 10 reps - 2x daily - 7x weekly  Seated Thoracic Lumbar Extension - 3 sets - 10 reps - 2x daily - 7x weekly  Seated Shoulder Row with Anchored Resistance  - 3 sets - 10 reps - 2x daily - 7x weekly  Patient tolerated treatment well today and continues to make progress towards her goals. Patient demonstrated increase activity tolerance (walking distances) and stability control for her upper body while doing LEs strengthening exercises. Patient demonstrated positive gains during the session today and would benefit from continued physical therapy to address remaining impairments and functional limitations to work towards stated goals and return to PLOF or maximal functional independence.      PT Education - 07/16/19 1532    Education Details  Exercise purpose/form. Purpose of the session.    Person(s) Educated  Patient    Methods  Explanation;Demonstration;Tactile cues;Verbal cues    Comprehension  Verbalized understanding;Returned demonstration;Verbal cues required       PT Short Term Goals - 07/03/19 1152      PT SHORT TERM GOAL #1   Title  Be independent with intial home exercise program completed at least 3 times per week for self-management of symptoms.    Baseline  initial HEP provided at IE (09/16);    Time  4    Period  Weeks  Status  Achieved    Target Date  06/26/19        PT Long Term Goals - 07/03/19 1153      PT LONG TERM GOAL #1   Title  Be independent with a long-term home exercise program for self-management of symptoms.    Baseline  initial HEP provided at IE (05/29/2019); Continue progress HEP (07/03/2019)    Time  12    Period  Weeks    Status  On-going    Target Date  08/21/19      PT LONG TERM GOAL #2   Title  Patient will be able to ambulate > 200 feet with LRAD for improved community mobility    Baseline  20 feet (05/29/2019); 163 feet with SBA  (07/03/2019)    Time  8    Period  Weeks    Status  Revised    Target Date  08/21/19      PT LONG TERM GOAL #3   Title  Reduce pain with functional activities to equal or less than 3/10 to allow patient to be more independent with usual activities including  ADLs, IADLs, and social engagement with less difficulty.     Baseline  10/10 (05/29/2019); 4/10 (07/03/2019)    Time  8    Period  Weeks    Status  On-going    Target Date  07/24/19      PT LONG TERM GOAL #4   Title  Be able to complete 5 sit to stand test in 15 seconds to improve functional mobility for transfers and community participation.    Baseline  37.5s for 5 STS (05/29/2019); 20s with BUEs support at thigh (07/03/2019)    Time  8    Period  Weeks    Status  Revised    Target Date  07/24/19      PT LONG TERM GOAL #5   Title  Patient will be able to complete 10 feet TUG within 20s to improve fuctional mobility for transfers and community participation.    Baseline  63s (05/29/2019); 27 seconds with rollator (07/03/2019)    Time  12    Period  Weeks    Status  Revised    Target Date  08/21/19      PT LONG TERM GOAL #6   Title  Demonstrate improved FOTO score by 10 units to demonstrate improvement in overall condition and self-reported functional ability.    Baseline  FOTO 30; MCD 10 (05/29/2019); Deferred 06/24/2019    Time  12    Period  Weeks    Status  New    Target Date  08/21/19            Plan - 07/16/19 1528    Clinical Impression Statement  Patient tolerated treatment well today and continues to make progress towards her goals. Patient demonstrated increase activity tolerance (walking distances) and stability control for her upper body while doing LEs strengthening exercises. Patient demonstrated positive gains during the session today and would benefit from continued physical therapy to address remaining impairments and functional limitations to work towards stated goals and return to PLOF or maximal functional independence.    Personal Factors and Comorbidities  Age;Time since onset of injury/illness/exacerbation;Comorbidity 3+;Fitness    Comorbidities  SOB with exertion; shingles (2020), chronic kidney disease (between stage III-IV according to pt), diabetes  (last a1c around 7), stomach polyp that was recently surgically repaired, obesity, Hx of B rotator cuff tears (with repairs), history of  2 strokes, pacemaker with revision(properly functioning per pt 05/29/2019), achy joints, bilateral knee replacement, gout, dypsnea, acid reflux, CHF, GI bleed, hypertension, afib w/history of cardioversion, osteoporosis, repeated hospitalization with decreased endurance and functional mobility, wide range of allergies. Recent surgical procedures (in 2019): cardioversion, esophagogastroduendoscopy with propofol (x2), back surgery (pt describes kyphoplasty at 2 levels, and she mentioned prior fusion), eye surgery, diabetes, no seizures. See chart for extensive past medical history.    Examination-Activity Limitations  Dressing;Sleep;Sit;Carry;Bathing;Squat;Bed Mobility;Hygiene/Grooming;Lift;Stairs;Bend;Stand;Caring for Others;Reach Overhead;Transfers;Locomotion Level;Toileting;Continence    Examination-Participation Restrictions  Cleaning;Shop;Laundry;Community Activity;Meal Prep;Yard Work;Driving;Interpersonal Relationship    Stability/Clinical Decision Making  Evolving/Moderate complexity    Rehab Potential  Fair    PT Frequency  2x / week    PT Duration  12 weeks    PT Treatment/Interventions  Cryotherapy;ADLs/Self Care Home Management;Moist Heat;Gait training;Stair training;Functional mobility training;Therapeutic activities;Therapeutic exercise;Balance training;Neuromuscular re-education;Wheelchair mobility training;Patient/family education;Passive range of motion;Manual techniques;Dry needling;Energy conservation;Aquatic Therapy;Other (comment)   Joint mobilizations grades I-IV   PT Next Visit Plan  UE/LE strengthening, ROM, trunk extensor strengthening    PT Home Exercise Plan  Medbridge: [id number] and Agree with Plan of Care  Patient       Patient will benefit from skilled therapeutic intervention in order to improve the following deficits and  impairments:  Abnormal gait, Decreased mobility, Decreased coordination, Increased muscle spasms, Postural dysfunction, Pain, Improper body mechanics, Decreased activity tolerance, Decreased endurance, Decreased range of motion, Decreased strength, Hypomobility, Impaired perceived functional ability, Impaired UE functional use, Decreased balance, Difficulty walking, Decreased safety awareness, Impaired flexibility, Obesity  Visit Diagnosis: Pain in thoracic spine  Chronic midline low back pain without sciatica  Difficulty in walking, not elsewhere classified  Muscle weakness (generalized)  Acute pain of left shoulder     Problem List Patient Active Problem List   Diagnosis Date Noted  . RUQ pain   . UTI (urinary tract infection) 06/14/2018  . Type 2 diabetes mellitus with neurological complications ([place]) 99991111  . Acute respiratory failure ([place]) 04/09/2018  . Chronic anticoagulation 04/09/2018  . Pacemaker 04/09/2018  . H/O TIA (transient ischemic attack) and stroke 04/09/2018  . CAD (coronary artery disease) 04/09/2018  . Hematemesis 04/05/2018  . Hypotension 02/04/2018  . Atrial fibrillation with RVR ([place]) 02/04/2018  . Atrial flutter ([place]) 08/24/2017  . Sinus node dysfunction ([place]) 08/24/2017  . Dizziness 06/12/2017  . Mastalgia 02/14/2017  . Acute on chronic renal insufficiency 01/09/2017  . Fatty liver 01/09/2017  . Benign essential HTN 09/08/2016  . Nephrolithiasis 02/22/2016  . Diabetic neuropathy with neurologic complication ([place]) 123XX123  . Moderate episode of recurrent major depressive disorder ([place]) 08/11/2015  . Gastritis without bleeding   . Gastric polyp   . Gastro-esophageal reflux disease without esophagitis   . PAF (paroxysmal atrial fibrillation) ([place]) 06/29/2015  . Intestinal obstruction ([place]) 02/20/2015  . DDD (degenerative disc disease), lumbar 01/19/2015  . Back pain 01/12/2015  . Gout 01/12/2015  . Complete tear of left rotator cuff  10/16/2014  . Intervertebral disc disorder with radiculopathy of lumbar region 09/17/2014  . Lumbar radiculitis 09/17/2014  . Lumbar stenosis with neurogenic claudication 09/17/2014  . Neuritis or radiculitis due to rupture of lumbar intervertebral disc 09/17/2014  . Atherosclerosis of both carotid arteries 07/15/2014  . Essential hypertension 07/15/2014  . Hyperlipemia, mixed 07/15/2014  . 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 Body mass index (BMI) of 40.0-44.9 in adult ([place]) 06/21/2014  . Mixed incontinence 06/25/2013  . Mixed urge and stress incontinence 06/25/2013  .  Nocturia 06/25/2013  . Blurring of visual image 04/11/2012    Sherrilyn Rist, SPT 07/16/19, 3:34 PM  Everlean Alstrom. Graylon Good, PT, DPT 07/16/19, 6:31 PM   [facility] [address], [place], [address] Phone: [phone]   Fax:  [phone]  Name: LOURDES TEACHOUT MRN: [id number] Date of Birth: 1941-03-02

## 2019-07-18 ENCOUNTER — Encounter: Payer: Medicare Other | Admitting: Physical Therapy

## 2019-07-22 ENCOUNTER — Encounter: Payer: Medicare Other | Admitting: Physical Therapy

## 2019-07-23 ENCOUNTER — Other Ambulatory Visit: Payer: Self-pay

## 2019-07-23 ENCOUNTER — Encounter: Payer: Self-pay | Admitting: Physical Therapy

## 2019-07-23 ENCOUNTER — Ambulatory Visit: Payer: Medicare Other | Admitting: Physical Therapy

## 2019-07-23 ENCOUNTER — Encounter: Payer: Medicare Other | Admitting: Physical Therapy

## 2019-07-23 DIAGNOSIS — G8929 Other chronic pain: Secondary | ICD-10-CM

## 2019-07-23 DIAGNOSIS — M546 Pain in thoracic spine: Secondary | ICD-10-CM

## 2019-07-23 DIAGNOSIS — M6281 Muscle weakness (generalized): Secondary | ICD-10-CM

## 2019-07-23 DIAGNOSIS — M25512 Pain in left shoulder: Secondary | ICD-10-CM

## 2019-07-23 DIAGNOSIS — R262 Difficulty in walking, not elsewhere classified: Secondary | ICD-10-CM

## 2019-07-23 NOTE — Therapy (Signed)
[facility] [address], [place], [address] Phone: [phone]   Fax:  [phone]  Physical Therapy Treatment  Patient Details  Name: Deborah Obrien MRN: [id number] Date of Birth: [birth year] Referring Provider (PT): Eugene Garnet, MD   Encounter Date: 07/23/2019  PT End of Session - 07/23/19 1526    Visit Number  13    Number of Visits  24    Date for PT Re-Evaluation  08/21/19    Authorization Type  Primary Medicare; secondary BCBS reporting from 05/29/2019    Authorization Time Period  Current certification: [id number] to 08/21/2019    Authorization - Visit Number  3    Authorization - Number of Visits  10    PT Start Time  [id number]    PT Stop Time  1619    PT Time Calculation (min)  45 min    Equipment Utilized During Treatment  Gait belt    Activity Tolerance  Patient limited by pain;Patient tolerated treatment well;Patient limited by fatigue    Behavior During Therapy  [facility] for tasks assessed/performed       Past Medical History:  Diagnosis Date  . A-fib ([place]) 2016  . Acid reflux   . Anxiety   . Bowel obstruction ([place])   . CHF (congestive heart failure) ([place]) 2017  . Chronic kidney disease (CKD) stage G3a/A1, moderately decreased glomerular filtration rate (GFR) between 45-59 mL/min/1.73 square meter and albuminuria creatinine ratio less than 30 mg/g   . Diabetes mellitus without complication ([place])   . Dyspnea   . Fatty liver   . GI bleed   . Gout   . Hypertension   . Morbid obesity ([place])   . Osteoarthritis   . Osteoporosis   . Paroxysmal atrial fibrillation (HCC)   . Presence of permanent cardiac pacemaker   . Sinus node dysfunction ([place]) 08/24/2017  . TIA (transient ischemic attack)     Past Surgical History:  Procedure Laterality Date  . ABDOMINAL HYSTERECTOMY  1980  . APPENDECTOMY    . BACK SURGERY     2008  . BACK SURGERY  2019   patient describes kyphoplasty for compression  fractures, MD referral said fusion  . BREAST BIOPSY Right 08/16   stereo fibroadenomatous change, neg for atypia  . BREAST EXCISIONAL BIOPSY Left    neg  . CARDIAC CATHETERIZATION Left 08/25/2016   Procedure: Left Heart Cath and Coronary Angiography;  Surgeon: Corey Skains, MD;  Location: [facility];  Service: Cardiovascular;  Laterality: Left;  . CARDIOVERSION N/A 06/28/2017   Procedure: CARDIOVERSION;  Surgeon: Corey Skains, MD;  Location: [facility];  Service: Cardiovascular;  Laterality: N/A;  . CARDIOVERSION N/A 02/06/2018   Procedure: CARDIOVERSION;  Surgeon: Josue Hector, MD;  Location: [facility];  Service: Cardiovascular;  Laterality: N/A;  . CATARACT EXTRACTION, BILATERAL  2012  . CHOLECYSTECTOMY    . colon blockage  1999  . COLON SURGERY  1999  . COLONOSCOPY  2016   polyps removed 2016  . DORSAL COMPARTMENT RELEASE Left 09/14/2016   Procedure: RELEASE DORSAL COMPARTMENT (DEQUERVAIN);  Surgeon: Dereck Leep, MD;  Location: [facility];  Service: Orthopedics;  Laterality: Left;  . ESOPHAGOGASTRODUODENOSCOPY N/A 01/27/2015   Procedure: ESOPHAGOGASTRODUODENOSCOPY (EGD);  Surgeon: Lollie Sails, MD;  Location[facility] ENDOSCOPY;  Service: Endoscopy;  Laterality: N/A;  . ESOPHAGOGASTRODUODENOSCOPY (EGD) WITH PROPOFOL N/A 07/24/2015   Procedure: ESOPHAGOGASTRODUODENOSCOPY (EGD) WITH PROPOFOL;  Surgeon: Lucilla Lame, MD;  Location: [facility] ENDOSCOPY;  Service: Endoscopy;  Laterality: N/A;  . ESOPHAGOGASTRODUODENOSCOPY (EGD) WITH PROPOFOL N/A 04/12/2018   Procedure: ESOPHAGOGASTRODUODENOSCOPY (EGD) WITH PROPOFOL;  Surgeon: Jonathon Bellows, MD;  Location: [facility] ENDOSCOPY;  Service: Gastroenterology;  Laterality: N/A;  . ESOPHAGOGASTRODUODENOSCOPY (EGD) WITH PROPOFOL N/A 06/22/2018   Procedure: ESOPHAGOGASTRODUODENOSCOPY (EGD) WITH PROPOFOL;  Surgeon: [name], MD;  Location: [facility] ENDOSCOPY;  Service: Endoscopy;  Laterality: N/A;  . ESOPHAGOGASTRODUODENOSCOPY (EGD) WITH  PROPOFOL N/A 07/09/2018   Procedure: ESOPHAGOGASTRODUODENOSCOPY (EGD) WITH PROPOFOL with resection of gastric polyps;  Surgeon: Jonathon Bellows, MD;  Location: [facility] ENDOSCOPY;  Service: Gastroenterology;  Laterality: N/A;  . ESOPHAGOGASTRODUODENOSCOPY (EGD) WITH PROPOFOL N/A 05/17/2019   Procedure: ESOPHAGOGASTRODUODENOSCOPY (EGD) WITH PROPOFOL;  Surgeon: Jonathon Bellows, MD;  Location: [facility] ENDOSCOPY;  Service: Gastroenterology;  Laterality: N/A;  . EUS N/A 06/22/2018   Procedure: UPPER ENDOSCOPIC ULTRASOUND (EUS) RADIAL;  Surgeon: [name], MD;  Location: [facility] ENDOSCOPY;  Service: Endoscopy;  Laterality: N/A;  . JOINT REPLACEMENT  2014   Bilateral Knee replacement  . LEAD REVISION/REPAIR N/A 08/29/2017   Procedure: LEAD REVISION/REPAIR;  Surgeon: Constance Haw, MD;  Location: [facility];  Service: Cardiovascular;  Laterality: N/A;  . OOPHORECTOMY    . PACEMAKER INSERTION Left 07/01/2015   Procedure: INSERTION PACEMAKER;  Surgeon: Isaias Cowman, MD;  Location: [facility];  Service: Cardiovascular;  Laterality: Left;  . PACEMAKER REVISION N/A 08/28/2017   Procedure: PACEMAKER REVISION;  Surgeon: Deboraha Sprang, MD;  Location: [facility];  Service: Cardiovascular;  Laterality: N/A;  . REPLACEMENT TOTAL KNEE BILATERAL    . SHOULDER ARTHROSCOPY WITH SUBACROMIAL DECOMPRESSION Left 2013  . TEE WITHOUT CARDIOVERSION N/A 06/28/2017   Procedure: TRANSESOPHAGEAL ECHOCARDIOGRAM (TEE);  Surgeon: Corey Skains, MD;  Location: [facility];  Service: Cardiovascular;  Laterality: N/A;    There were no vitals filed for this visit.  Subjective Assessment - 07/23/19 1917    Subjective  Patient reports she felt pretty good until she took a nap on the the bed with supine position. Patient had a pain pill prevouly and her pain is currently as 5/10. Patient reports that she had a near fall this morning and her legs were given out on her. Patient also reported that her blood sugar was  404mg /dl prior to therapy. (Session hold on and continued when patient re-checked her blood sugar which was 265mg /dl)    Patient is accompained by:  Family member   [name]   Pertinent History  Patient is a 78 y.o. female who presents to outpatient physical therapy with a referral for secondary kyphosis of thoracic region. This patient's chief complaints consist of pain, inability to stand up straight, poor activity tolerance following recently rib fracture at 7th rib at R 04/02/2019. Patient also has a fall history that being dropped at rehab facility leading to 2 compression fractures that required kyphoplasty in late 2019. The above history leading to the following functional deficits: difficulty with ADLs, IADLs, bed mobility, transfers, household and community mobility, social and community participation.  Relevant past medical history and comorbidities include SOB with exertion; shingles (2020), chronic kidney disease (between stage III-IV according to pt), diabetes (last a1c around 7), stomach polyp that was recently surgically repaired, obesity, Hx of B rotator cuff tears (with repairs), history of 2 strokes, pacemaker with revision(properly functioning per pt 05/29/2019), achy joints, bilateral knee replacement, gout, dypsnea, acid reflux, CHF, GI bleed, hypertension, afib w/history of cardioversion, osteoporosis, repeated hospitalization with decreased endurance and functional  mobility, wide range of allergies. Recent surgical procedures (in 2019): cardioversion, esophagogastroduendoscopy with propofol (x2), back surgery (pt describes kyphoplasty at 2 levels, and she mentioned prior fusion), eye surgery, diabetes, no seizures. See chart for extensive past medical history.    Limitations  Sitting;Lifting;Standing;Walking;House hold activities    How long can you sit comfortably?  30 min    How long can you stand comfortably?  1-1.5 minutes    How long can you walk comfortably?  less than 20 feet     Diagnostic tests  04/02/2019 CI chest IMPRESSION: Mildly displaced right seventh rib fracture. Scheduled bone density exam on 06/07/2019    Patient Stated Goals  Walking longer distance, able to play with her grandchildren as well a reduce of her back pain.    Currently in Pain?  Yes    Pain Score  5     Pain Onset  More than a month ago             Bloodglucose: patient self-reported404 mg/dlthis morning. 265mg /dL during session.   TREATMENT:  Pacemaker  Therapeutic exercise:to centralize symptoms and improve ROM, strength, muscular endurance, and activity tolerance required for successful completion of functional activities. -NuSteplevel3withusing bilateral lower extremities.(BLEs only)Setting8. For improved extremity mobility, muscular endurance, and activity tolerance; and to induce the analgesic effect of aerobic exercise, stimulate improved joint nutrition, and prepare body structures and systems for followinginterventions.69minutes. -Patient was educated on diagnosis, anatomy and pathology involved, prognosis, role of PT. Emphasized on blood sugar safe range for exercises and it physiology. Pt was educated on and agreed to plan of care.  - (Session hold on and continued when patient re-checked her blood sugar which was 265mg /dl) (unbilled 79mins)  - Seated straight leg raise each side with 2lb ankle weight. X 43min. Cuing improve core and sitting posture. - Seated long arc 38mins with 2lb ankle weight, cuing for abdominal brace. Frequent cuing to activate core and sitting without leaning backward.    Therapeutic activities: for functional strengthening and improved functional activity tolerance. - Ambulationoutside on sidewalk x150 feet using rollator from clinic to patient's car. CGAfor safety. Patient presented with sudden LE given multiple times.     HOME EXERCISE PROGRAM Access Code: [email or web address]  URL: [email or web address]  Date:  06/03/2019  Prepared by: Rosita Kea   Exercises  Neck Sidebending - 5 reps - 30 seconds hold - 2x daily - 7x weekly  Standing Cervical Rotation AROM - 5 reps - 30 seconds hold - 2x daily - 7x weekly  Seated Shoulder Flexion - 3 sets - 10 reps - 2x daily - 7x weekly  Seated Thoracic Lumbar Extension - 3 sets - 10 reps - 2x daily - 7x weekly  Seated Shoulder Row with Anchored Resistance - 3 sets - 10 reps - 2x daily - 7x weekly  Patient tolerated treatment fair today. Patient demonstrated good forms, techniques, and muscle activation with prescribed exercises today but had hold on session at beginning due to patient reported elevated blood sugar measured 1 hour prior to therapy with 404mg /dl. Patient education provided and demonstrated good understanding of medical importance that if this uncontrolled blood glucose persist that she needed to see her MD. Patient demonstrated LEs lost of knee extension stability during ambulation back to her car but able to safe return to sitting inside of the car with CGA. With the above presentation, patient would benefit from continued physical therapy to address remaining impairments and functional limitations to work towards stated goals and return to  PLOF or maximal functional independence.       PT Education - 07/23/19 1526    Education Details  Exercise purpose/form. Purpose of the session.    Person(s) Educated  Patient    Methods  Explanation;Demonstration;Tactile cues;Verbal cues    Comprehension  Verbalized understanding;Returned demonstration;Verbal cues required;Tactile cues required       PT Short Term Goals - 07/03/19 1152      PT SHORT TERM GOAL #1   Title  Be independent with intial home exercise program completed at least 3 times per week for self-management of symptoms.    Baseline  initial HEP provided at IE (09/16);    Time  4    Period  Weeks    Status  Achieved    Target Date  06/26/19        PT Long Term Goals -  07/03/19 1153      PT LONG TERM GOAL #1   Title  Be independent with a long-term home exercise program for self-management of symptoms.    Baseline  initial HEP provided at IE (05/29/2019); Continue progress HEP (07/03/2019)    Time  12    Period  Weeks    Status  On-going    Target Date  08/21/19      PT LONG TERM GOAL #2   Title  Patient will be able to ambulate > 200 feet with LRAD for improved community mobility    Baseline  20 feet (05/29/2019); 163 feet with SBA  (07/03/2019)    Time  8    Period  Weeks    Status  Revised    Target Date  08/21/19      PT LONG TERM GOAL #3   Title  Reduce pain with functional activities to equal or less than 3/10 to allow patient to be more independent with usual activities including ADLs, IADLs, and social engagement with less difficulty.     Baseline  10/10 (05/29/2019); 4/10 (07/03/2019)    Time  8    Period  Weeks    Status  On-going    Target Date  07/24/19      PT LONG TERM GOAL #4   Title  Be able to complete 5 sit to stand test in 15 seconds to improve functional mobility for transfers and community participation.    Baseline  37.5s for 5 STS (05/29/2019); 20s with BUEs support at thigh (07/03/2019)    Time  8    Period  Weeks    Status  Revised    Target Date  07/24/19      PT LONG TERM GOAL #5   Title  Patient will be able to complete 10 feet TUG within 20s to improve fuctional mobility for transfers and community participation.    Baseline  63s (05/29/2019); 27 seconds with rollator (07/03/2019)    Time  12    Period  Weeks    Status  Revised    Target Date  08/21/19      PT LONG TERM GOAL #6   Title  Demonstrate improved FOTO score by 10 units to demonstrate improvement in overall condition and self-reported functional ability.    Baseline  FOTO 30; MCD 10 (05/29/2019); Deferred 06/24/2019    Time  12    Period  Weeks    Status  New    Target Date  08/21/19            Plan - 07/23/19 1553    Clinical  Impression Statement  Patient tolerated treatment fair today. Patient demonstrated good forms, techniques, and muscle activation with prescribed exercises today but had hold on session at beginning due to patient reported elevated blood sugar measured 1 hour prior to therapy with 404mg /dl. Patient education provided and demonstrated good understanding of medical importance that if this uncontrolled BP persist that she needed to see her MD. Patient demonstrated LEs lost of knee extension stability during ambulation back to her car but able to safe return to sitting inside of the car with CGA. With the above presentation, patient would benefit from continued physical therapy to address remaining impairments and functional limitations to work towards stated goals and return to PLOF or maximal functional independence.    Personal Factors and Comorbidities  Age;Time since onset of injury/illness/exacerbation;Comorbidity 3+;Fitness    Comorbidities  SOB with exertion; shingles (2020), chronic kidney disease (between stage III-IV according to pt), diabetes (last a1c around 7), stomach polyp that was recently surgically repaired, obesity, Hx of B rotator cuff tears (with repairs), history of 2 strokes, pacemaker with revision(properly functioning per pt 05/29/2019), achy joints, bilateral knee replacement, gout, dypsnea, acid reflux, CHF, GI bleed, hypertension, afib w/history of cardioversion, osteoporosis, repeated hospitalization with decreased endurance and functional mobility, wide range of allergies. Recent surgical procedures (in 2019): cardioversion, esophagogastroduendoscopy with propofol (x2), back surgery (pt describes kyphoplasty at 2 levels, and she mentioned prior fusion), eye surgery, diabetes, no seizures. See chart for extensive past medical history.    Examination-Activity Limitations  Dressing;Sleep;Sit;Carry;Bathing;Squat;Bed Mobility;Hygiene/Grooming;Lift;Stairs;Bend;Stand;Caring for Others;Reach  Overhead;Transfers;Locomotion Level;Toileting;Continence    Examination-Participation Restrictions  Cleaning;Shop;Laundry;Community Activity;Meal Prep;Yard Work;Driving;Interpersonal Relationship    Stability/Clinical Decision Making  Evolving/Moderate complexity    Rehab Potential  Fair    PT Frequency  2x / week    PT Duration  12 weeks    PT Treatment/Interventions  Cryotherapy;ADLs/Self Care Home Management;Moist Heat;Gait training;Stair training;Functional mobility training;Therapeutic activities;Therapeutic exercise;Balance training;Neuromuscular re-education;Wheelchair mobility training;Patient/family education;Passive range of motion;Manual techniques;Dry needling;Energy conservation;Aquatic Therapy;Other (comment)   Joint mobilizations grades I-IV   PT Next Visit Plan  UE/LE strengthening, ROM, trunk extensor strengthening    PT Home Exercise Plan  Medbridge: [id number] and Agree with Plan of Care  Patient       Patient will benefit from skilled therapeutic intervention in order to improve the following deficits and impairments:  Abnormal gait, Decreased mobility, Decreased coordination, Increased muscle spasms, Postural dysfunction, Pain, Improper body mechanics, Decreased activity tolerance, Decreased endurance, Decreased range of motion, Decreased strength, Hypomobility, Impaired perceived functional ability, Impaired UE functional use, Decreased balance, Difficulty walking, Decreased safety awareness, Impaired flexibility, Obesity  Visit Diagnosis: Pain in thoracic spine  Chronic midline low back pain without sciatica  Difficulty in walking, not elsewhere classified  Muscle weakness (generalized)  Acute pain of left shoulder     Problem List Patient Active Problem List   Diagnosis Date Noted  . RUQ pain   . UTI (urinary tract infection) 06/14/2018  . Type 2 diabetes mellitus with neurological complications ([place]) 99991111  . Acute respiratory failure ([place])  04/09/2018  . Chronic anticoagulation 04/09/2018  . Pacemaker 04/09/2018  . H/O TIA (transient ischemic attack) and stroke 04/09/2018  . CAD (coronary artery disease) 04/09/2018  . Hematemesis 04/05/2018  . Hypotension 02/04/2018  . Atrial fibrillation with RVR ([place]) 02/04/2018  . Atrial flutter ([place]) 08/24/2017  . Sinus node dysfunction ([place]) 08/24/2017  . Dizziness 06/12/2017  . Mastalgia 02/14/2017  . Acute on chronic renal insufficiency 01/09/2017  .  Fatty liver 01/09/2017  . Benign essential HTN 09/08/2016  . Nephrolithiasis 02/22/2016  . Diabetic neuropathy with neurologic complication ([place]) 123XX123  . Moderate episode of recurrent major depressive disorder ([place]) 08/11/2015  . Gastritis without bleeding   . Gastric polyp   . Gastro-esophageal reflux disease without esophagitis   . PAF (paroxysmal atrial fibrillation) ([place]) 06/29/2015  . Intestinal obstruction ([place]) 02/20/2015  . DDD (degenerative disc disease), lumbar 01/19/2015  . Back pain 01/12/2015  . Gout 01/12/2015  . Complete tear of left rotator cuff 10/16/2014  . Intervertebral disc disorder with radiculopathy of lumbar region 09/17/2014  . Lumbar radiculitis 09/17/2014  . Lumbar stenosis with neurogenic claudication 09/17/2014  . Neuritis or radiculitis due to rupture of lumbar intervertebral disc 09/17/2014  . Atherosclerosis of both carotid arteries 07/15/2014  . Essential hypertension 07/15/2014  . Hyperlipemia, mixed 07/15/2014  . 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 Body mass index (BMI) of 40.0-44.9 in adult ([place]) 06/21/2014  . Mixed incontinence 06/25/2013  . Mixed urge and stress incontinence 06/25/2013  . Nocturia 06/25/2013  . Blurring of visual image 04/11/2012    Sherrilyn Rist, SPT 07/23/19, 7:19 PM  Everlean Alstrom. Graylon Good, PT, DPT 07/23/19, 7:20 PM   [facility] [address], [place], [address] Phone: [phone]   Fax:   [phone]  Name: YANISA REHAGEN MRN: [id number] Date of Birth: 04-04-1941

## 2019-07-24 ENCOUNTER — Encounter: Payer: Medicare Other | Admitting: Physical Therapy

## 2019-07-25 ENCOUNTER — Ambulatory Visit: Payer: Medicare Other | Admitting: Physical Therapy

## 2019-07-25 ENCOUNTER — Telehealth: Payer: Self-pay | Admitting: Physical Therapy

## 2019-07-25 NOTE — Telephone Encounter (Signed)
Called patient back as she requested. She had called earlier today to let us know she has a UTI and canceled appointments until 08/13/2019 because Her MD has put her on Cipro and told her not to exercise right now. Spoke to patient and she reported that she read in the side effects of Cipro to avoid strenuous exercise due to risk for tendon rupture. We discussed that the exercise she has been doing in the clinic is not usually considered very strenuous (walking, sit to stands, nu-step), but for her it could be. She also reported she is having uncontrolled diarrhea and has a lot of abdominal pain associated with the UTI. States her doctor wants a stool sample but must wait until after the antibiotics are cleared. Advised her of my concerns about losing basic functional strength and independence if she does not continue to work on her exercises and physical therapy, but that her health is paramount and she should ask her doctor if she should continue physical therapy while on the antibiotic. Reviewed some gentle exercises she could safely do at home. Advised her to let us know if she starts to feel better and is advised by her doctor that physical therapy is safe, she can call to get back on the schedule before 08/13/2019. She understood and agreed.   Everlean Alstrom. Graylon Good, PT, DPT 07/25/19, 8:58 AM

## 2019-07-29 ENCOUNTER — Ambulatory Visit: Payer: Medicare Other | Admitting: Physical Therapy

## 2019-07-29 ENCOUNTER — Encounter: Payer: Medicare Other | Admitting: Physical Therapy

## 2019-07-30 ENCOUNTER — Encounter: Payer: Medicare Other | Admitting: Physical Therapy

## 2019-07-31 ENCOUNTER — Encounter: Payer: Medicare Other | Admitting: Physical Therapy

## 2019-07-31 ENCOUNTER — Ambulatory Visit: Payer: Medicare Other | Admitting: Physical Therapy

## 2019-07-31 ENCOUNTER — Other Ambulatory Visit: Payer: Self-pay

## 2019-07-31 DIAGNOSIS — M546 Pain in thoracic spine: Secondary | ICD-10-CM

## 2019-07-31 DIAGNOSIS — G8929 Other chronic pain: Secondary | ICD-10-CM

## 2019-07-31 DIAGNOSIS — M25512 Pain in left shoulder: Secondary | ICD-10-CM

## 2019-07-31 DIAGNOSIS — R262 Difficulty in walking, not elsewhere classified: Secondary | ICD-10-CM

## 2019-07-31 DIAGNOSIS — M6281 Muscle weakness (generalized): Secondary | ICD-10-CM

## 2019-07-31 DIAGNOSIS — M545 Low back pain, unspecified: Secondary | ICD-10-CM

## 2019-07-31 NOTE — Therapy (Signed)
[facility] [address] [place], [address] Phone: [phone]   Fax:  [phone]  Physical Therapy Treatment  Patient Details  Name: Deborah Obrien MRN: [id number] Date of Birth: 19-Sep-1940 Referring Provider (PT): Eugene Garnet, MD   Encounter Date: 07/31/2019  PT End of Session - 07/31/19 1405    Visit Number  14    Number of Visits  24    Date for PT Re-Evaluation  08/21/19    Authorization Type  Primary Medicare; secondary BCBS reporting from 05/29/2019    Authorization Time Period  Current certification: [id number] to 08/21/2019    Authorization - Visit Number  4    Authorization - Number of Visits  10    PT Start Time  [id number]    PT Stop Time  1430    PT Time Calculation (min)  35 min    Equipment Utilized During Treatment  Gait belt    Activity Tolerance  Patient limited by pain;Patient tolerated treatment well;Patient limited by fatigue    Behavior During Therapy  [facility] for tasks assessed/performed       Past Medical History:  Diagnosis Date  . A-fib ([place]) 2016  . Acid reflux   . Anxiety   . Bowel obstruction ([place])   . CHF (congestive heart failure) ([place]) 2017  . Chronic kidney disease (CKD) stage G3a/A1, moderately decreased glomerular filtration rate (GFR) between 45-59 mL/min/1.73 square meter and albuminuria creatinine ratio less than 30 mg/g   . Diabetes mellitus without complication ([place])   . Dyspnea   . Fatty liver   . GI bleed   . Gout   . Hypertension   . Morbid obesity ([place])   . Osteoarthritis   . Osteoporosis   . Paroxysmal atrial fibrillation (HCC)   . Presence of permanent cardiac pacemaker   . Sinus node dysfunction ([place]) 08/24/2017  . TIA (transient ischemic attack)     Past Surgical History:  Procedure Laterality Date  . ABDOMINAL HYSTERECTOMY  1980  . APPENDECTOMY    . BACK SURGERY     2008  . BACK SURGERY  2019   patient describes kyphoplasty for compression  fractures, MD referral said fusion  . BREAST BIOPSY Right 08/16   stereo fibroadenomatous change, neg for atypia  . BREAST EXCISIONAL BIOPSY Left    neg  . CARDIAC CATHETERIZATION Left 08/25/2016   Procedure: Left Heart Cath and Coronary Angiography;  Surgeon: Corey Skains, MD;  Location: [facility];  Service: Cardiovascular;  Laterality: Left;  . CARDIOVERSION N/A 06/28/2017   Procedure: CARDIOVERSION;  Surgeon: Corey Skains, MD;  Location: [facility];  Service: Cardiovascular;  Laterality: N/A;  . CARDIOVERSION N/A 02/06/2018   Procedure: CARDIOVERSION;  Surgeon: Josue Hector, MD;  Location: [facility];  Service: Cardiovascular;  Laterality: N/A;  . CATARACT EXTRACTION, BILATERAL  2012  . CHOLECYSTECTOMY    . colon blockage  1999  . COLON SURGERY  1999  . COLONOSCOPY  2016   polyps removed 2016  . DORSAL COMPARTMENT RELEASE Left 09/14/2016   Procedure: RELEASE DORSAL COMPARTMENT (DEQUERVAIN);  Surgeon: Dereck Leep, MD;  Location: [facility];  Service: Orthopedics;  Laterality: Left;  . ESOPHAGOGASTRODUODENOSCOPY N/A 01/27/2015   Procedure: ESOPHAGOGASTRODUODENOSCOPY (EGD);  Surgeon: Lollie Sails, MD;  Location: [facility];  Service: Endoscopy;  Laterality: N/A;  . ESOPHAGOGASTRODUODENOSCOPY (EGD) WITH PROPOFOL N/A 07/24/2015   Procedure: ESOPHAGOGASTRODUODENOSCOPY (EGD) WITH PROPOFOL;  Surgeon: Lucilla Lame, MD;  Location: [facility] ENDOSCOPY;  Service: Endoscopy;  Laterality: N/A;  . ESOPHAGOGASTRODUODENOSCOPY (EGD) WITH PROPOFOL N/A 04/12/2018   Procedure: ESOPHAGOGASTRODUODENOSCOPY (EGD) WITH PROPOFOL;  Surgeon: Jonathon Bellows, MD;  Location: [facility] ENDOSCOPY;  Service: Gastroenterology;  Laterality: N/A;  . ESOPHAGOGASTRODUODENOSCOPY (EGD) WITH PROPOFOL N/A 06/22/2018   Procedure: ESOPHAGOGASTRODUODENOSCOPY (EGD) WITH PROPOFOL;  Surgeon: [name], MD;  Location: [facility] ENDOSCOPY;  Service: Endoscopy;  Laterality: N/A;  . ESOPHAGOGASTRODUODENOSCOPY (EGD) WITH  PROPOFOL N/A 07/09/2018   Procedure: ESOPHAGOGASTRODUODENOSCOPY (EGD) WITH PROPOFOL with resection of gastric polyps;  Surgeon: Jonathon Bellows, MD;  Location: [facility] ENDOSCOPY;  Service: Gastroenterology;  Laterality: N/A;  . ESOPHAGOGASTRODUODENOSCOPY (EGD) WITH PROPOFOL N/A 05/17/2019   Procedure: ESOPHAGOGASTRODUODENOSCOPY (EGD) WITH PROPOFOL;  Surgeon: Jonathon Bellows, MD;  Location: [facility] ENDOSCOPY;  Service: Gastroenterology;  Laterality: N/A;  . EUS N/A 06/22/2018   Procedure: UPPER ENDOSCOPIC ULTRASOUND (EUS) RADIAL;  Surgeon: [name], MD;  Location: [facility] ENDOSCOPY;  Service: Endoscopy;  Laterality: N/A;  . JOINT REPLACEMENT  2014   Bilateral Knee replacement  . LEAD REVISION/REPAIR N/A 08/29/2017   Procedure: LEAD REVISION/REPAIR;  Surgeon: Constance Haw, MD;  Location: [facility];  Service: Cardiovascular;  Laterality: N/A;  . OOPHORECTOMY    . PACEMAKER INSERTION Left 07/01/2015   Procedure: INSERTION PACEMAKER;  Surgeon: Isaias Cowman, MD;  Location: [facility];  Service: Cardiovascular;  Laterality: Left;  . PACEMAKER REVISION N/A 08/28/2017   Procedure: PACEMAKER REVISION;  Surgeon: Deboraha Sprang, MD;  Location: [facility];  Service: Cardiovascular;  Laterality: N/A;  . REPLACEMENT TOTAL KNEE BILATERAL    . SHOULDER ARTHROSCOPY WITH SUBACROMIAL DECOMPRESSION Left 2013  . TEE WITHOUT CARDIOVERSION N/A 06/28/2017   Procedure: TRANSESOPHAGEAL ECHOCARDIOGRAM (TEE);  Surgeon: Corey Skains, MD;  Location: [facility];  Service: Cardiovascular;  Laterality: N/A;    There were no vitals filed for this visit.  Subjective Assessment - 07/31/19 1357    Subjective  Patient states she is feeling better than she was earlier in the week. She spoke with her doctor who said she could continue PT as long as she does not do anything too strenuous while she is on Cipro for her UTI. She feels like she overdid it a bit yesterday with dishes, putting up christmas tree. She  continues to have difficulty due to decreased endurance and pain. She missed her last PT appointment due to UTI. She rates her pain at 4-5/10 in the low back upon arrival.    Patient is accompained by:  Family member   [name]   Pertinent History  Patient is a 78 y.o. female who presents to outpatient physical therapy with a referral for secondary kyphosis of thoracic region. This patient's chief complaints consist of pain, inability to stand up straight, poor activity tolerance following recently rib fracture at 7th rib at R 04/02/2019. Patient also has a fall history that being dropped at rehab facility leading to 2 compression fractures that required kyphoplasty in late 2019. The above history leading to the following functional deficits: difficulty with ADLs, IADLs, bed mobility, transfers, household and community mobility, social and community participation.  Relevant past medical history and comorbidities include SOB with exertion; shingles (2020), chronic kidney disease (between stage III-IV according to pt), diabetes (last a1c around 7), stomach polyp that was recently surgically repaired, obesity, Hx of B rotator cuff tears (with repairs), history of 2 strokes, pacemaker with revision(properly functioning per pt 05/29/2019), achy joints, bilateral knee replacement, gout, dypsnea, acid reflux, CHF, GI  bleed, hypertension, afib w/history of cardioversion, osteoporosis, repeated hospitalization with decreased endurance and functional mobility, wide range of allergies. Recent surgical procedures (in 2019): cardioversion, esophagogastroduendoscopy with propofol (x2), back surgery (pt describes kyphoplasty at 2 levels, and she mentioned prior fusion), eye surgery, diabetes, no seizures. See chart for extensive past medical history.    Limitations  Sitting;Lifting;Standing;Walking;House hold activities    How long can you sit comfortably?  30 min    How long can you stand comfortably?  1-1.5 minutes     How long can you walk comfortably?  less than 20 feet    Diagnostic tests  04/02/2019 CI chest IMPRESSION: Mildly displaced right seventh rib fracture. Scheduled bone density exam on 06/07/2019    Patient Stated Goals  Walking longer distance, able to play with her grandchildren as well a reduce of her back pain.    Currently in Pain?  Yes    Pain Score  5     Pain Location  Back    Pain Onset  More than a month ago         Bloodglucose: patient self-reportedin 80s mg/dlthis morning.   TREATMENT:  Pacemaker  Therapeutic exercise:to centralize symptoms and improve ROM, strength, muscular endurance, and activity tolerance required for successful completion of functional activities. -NuSteplevel3-5withusing bilateral lower extremities.(BLEs only)Setting8. For improved extremity mobility, muscular endurance, and activity tolerance; and to induce the analgesic effect of aerobic exercise, stimulate improved joint nutrition, and prepare body structures and systems for followinginterventions.59minutes during subjective exam.  -Seated long arc x10 each side, then 42mins with 23b ankle weight, cuing for abdominal brace.Frequent cuing to activate core and sitting without leaning backward. No back support to improve trunk strength/endurance.   Therapeutic activities: for functional strengthening and improved functional activity tolerance. - Half sit down with hands holding on pink ball from chair x 10 reps. SBA-CGA for safety. Patient very wobbly - sit <> stand from 20 inch chair. 2x10 while holding pink ball. Cuing to keep heels on floor and squeeze glutes.  - Ambulationoutside on sidewalk 133 + 44 feet using rollator from clinic up and down ramp. SBA -CGAfor safety.2# ankle weights on.Cuing for improved posture, encouragement to continue, pacing.    HOME EXERCISE PROGRAM Access Code: [email or web address]  URL: [email or web address]  Date: 06/03/2019  Prepared by:  Rosita Kea   Exercises  Neck Sidebending - 5 reps - 30 seconds hold - 2x daily - 7x weekly  Standing Cervical Rotation AROM - 5 reps - 30 seconds hold - 2x daily - 7x weekly  Seated Shoulder Flexion - 3 sets - 10 reps - 2x daily - 7x weekly  Seated Thoracic Lumbar Extension - 3 sets - 10 reps - 2x daily - 7x weekly  Seated Shoulder Row with Anchored Resistance - 3 sets - 10 reps - 2x daily - 7x weekly     PT Education - 07/31/19 1405    Education Details  Exercise purpose/form. Purpose of the session.    Person(s) Educated  Patient    Methods  Explanation;Demonstration;Tactile cues;Verbal cues    Comprehension  Verbalized understanding;Returned demonstration;Verbal cues required;Tactile cues required;Need further instruction       PT Short Term Goals - 07/03/19 1152      PT SHORT TERM GOAL #1   Title  Be independent with intial home exercise program completed at least 3 times per week for self-management of symptoms.    Baseline  initial HEP provided at IE (09/16);    Time  4  Period  Weeks    Status  Achieved    Target Date  06/26/19        PT Long Term Goals - 07/03/19 1153      PT LONG TERM GOAL #1   Title  Be independent with a long-term home exercise program for self-management of symptoms.    Baseline  initial HEP provided at IE (05/29/2019); Continue progress HEP (07/03/2019)    Time  12    Period  Weeks    Status  On-going    Target Date  08/21/19      PT LONG TERM GOAL #2   Title  Patient will be able to ambulate > 200 feet with LRAD for improved community mobility    Baseline  20 feet (05/29/2019); 163 feet with SBA  (07/03/2019)    Time  8    Period  Weeks    Status  Revised    Target Date  08/21/19      PT LONG TERM GOAL #3   Title  Reduce pain with functional activities to equal or less than 3/10 to allow patient to be more independent with usual activities including ADLs, IADLs, and social engagement with less difficulty.     Baseline  10/10  (05/29/2019); 4/10 (07/03/2019)    Time  8    Period  Weeks    Status  On-going    Target Date  07/24/19      PT LONG TERM GOAL #4   Title  Be able to complete 5 sit to stand test in 15 seconds to improve functional mobility for transfers and community participation.    Baseline  37.5s for 5 STS (05/29/2019); 20s with BUEs support at thigh (07/03/2019)    Time  8    Period  Weeks    Status  Revised    Target Date  07/24/19      PT LONG TERM GOAL #5   Title  Patient will be able to complete 10 feet TUG within 20s to improve fuctional mobility for transfers and community participation.    Baseline  63s (05/29/2019); 27 seconds with rollator (07/03/2019)    Time  12    Period  Weeks    Status  Revised    Target Date  08/21/19      PT LONG TERM GOAL #6   Title  Demonstrate improved FOTO score by 10 units to demonstrate improvement in overall condition and self-reported functional ability.    Baseline  FOTO 30; MCD 10 (05/29/2019); Deferred 06/24/2019    Time  12    Period  Weeks    Status  New    Target Date  08/21/19            Plan - 07/31/19 1444    Clinical Impression Statement  Pt tolerated treatment well with some  intermittent difficulty due to pain and fatigue. Was able to return to more intensive exercises due to improved pain and energy today. Patient appears to be feeling better with treatment for UTI. Patient was quite unsteady on her feet today and demo knee instability during ambulation. She was educated on returning to sit <> stand program at home with suggestions for safety. Pt was able to complete all exercises with minimal to no lasting increase in pain or discomfort. Pt required multimodal cuing for proper technique and to facilitate improved neuromuscular control, strength, range of motion, and functional ability resulting in improved performance and form. Patient would benefit from continued physical  therapy to address remaining impairments and functional  limitations to work towards stated goals and return to PLOF or maximal functional independence    Personal Factors and Comorbidities  Age;Time since onset of injury/illness/exacerbation;Comorbidity 3+;Fitness    Comorbidities  SOB with exertion; shingles (2020), chronic kidney disease (between stage III-IV according to pt), diabetes (last a1c around 7), stomach polyp that was recently surgically repaired, obesity, Hx of B rotator cuff tears (with repairs), history of 2 strokes, pacemaker with revision(properly functioning per pt 05/29/2019), achy joints, bilateral knee replacement, gout, dypsnea, acid reflux, CHF, GI bleed, hypertension, afib w/history of cardioversion, osteoporosis, repeated hospitalization with decreased endurance and functional mobility, wide range of allergies. Recent surgical procedures (in 2019): cardioversion, esophagogastroduendoscopy with propofol (x2), back surgery (pt describes kyphoplasty at 2 levels, and she mentioned prior fusion), eye surgery, diabetes, no seizures. See chart for extensive past medical history.    Examination-Activity Limitations  Dressing;Sleep;Sit;Carry;Bathing;Squat;Bed Mobility;Hygiene/Grooming;Lift;Stairs;Bend;Stand;Caring for Others;Reach Overhead;Transfers;Locomotion Level;Toileting;Continence    Examination-Participation Restrictions  Cleaning;Shop;Laundry;Community Activity;Meal Prep;Yard Work;Driving;Interpersonal Relationship    Stability/Clinical Decision Making  Evolving/Moderate complexity    Rehab Potential  Fair    PT Frequency  2x / week    PT Duration  12 weeks    PT Treatment/Interventions  Cryotherapy;ADLs/Self Care Home Management;Moist Heat;Gait training;Stair training;Functional mobility training;Therapeutic activities;Therapeutic exercise;Balance training;Neuromuscular re-education;Wheelchair mobility training;Patient/family education;Passive range of motion;Manual techniques;Dry needling;Energy conservation;Aquatic Therapy;Other  (comment)   Joint mobilizations grades I-IV   PT Next Visit Plan  UE/LE strengthening, ROM, trunk extensor strengthening    PT Home Exercise Plan  Medbridge: [id number] and Agree with Plan of Care  Patient       Patient will benefit from skilled therapeutic intervention in order to improve the following deficits and impairments:  Abnormal gait, Decreased mobility, Decreased coordination, Increased muscle spasms, Postural dysfunction, Pain, Improper body mechanics, Decreased activity tolerance, Decreased endurance, Decreased range of motion, Decreased strength, Hypomobility, Impaired perceived functional ability, Impaired UE functional use, Decreased balance, Difficulty walking, Decreased safety awareness, Impaired flexibility, Obesity  Visit Diagnosis: Chronic midline low back pain without sciatica  Pain in thoracic spine  Difficulty in walking, not elsewhere classified  Muscle weakness (generalized)  Acute pain of left shoulder     Problem List Patient Active Problem List   Diagnosis Date Noted  . RUQ pain   . UTI (urinary tract infection) 06/14/2018  . Type 2 diabetes mellitus with neurological complications ([place]) 99991111  . Acute respiratory failure ([place]) 04/09/2018  . Chronic anticoagulation 04/09/2018  . Pacemaker 04/09/2018  . H/O TIA (transient ischemic attack) and stroke 04/09/2018  . CAD (coronary artery disease) 04/09/2018  . Hematemesis 04/05/2018  . Hypotension 02/04/2018  . Atrial fibrillation with RVR ([place]) 02/04/2018  . Atrial flutter ([place]) 08/24/2017  . Sinus node dysfunction ([place]) 08/24/2017  . Dizziness 06/12/2017  . Mastalgia 02/14/2017  . Acute on chronic renal insufficiency 01/09/2017  . Fatty liver 01/09/2017  . Benign essential HTN 09/08/2016  . Nephrolithiasis 02/22/2016  . Diabetic neuropathy with neurologic complication ([place]) [id number]  . Moderate episode of recurrent major depressive disorder ([place]) 08/11/2015  . Gastritis  without bleeding   . Gastric polyp   . Gastro-esophageal reflux disease without esophagitis   . PAF (paroxysmal atrial fibrillation) ([place]) 06/29/2015  . Intestinal obstruction ([place]) 02/20/2015  . DDD (degenerative disc disease), lumbar 01/19/2015  . Back pain 01/12/2015  . Gout 01/12/2015  . Complete tear of left rotator cuff 10/16/2014  . Intervertebral disc disorder with radiculopathy of lumbar  region 09/17/2014  . Lumbar radiculitis 09/17/2014  . Lumbar stenosis with neurogenic claudication 09/17/2014  . Neuritis or radiculitis due to rupture of lumbar intervertebral disc 09/17/2014  . Atherosclerosis of both carotid arteries 07/15/2014  . Essential hypertension 07/15/2014  . Hyperlipemia, mixed 07/15/2014  . 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 Body mass index (BMI) of 40.0-44.9 in adult ([place]) 06/21/2014  . Mixed incontinence 06/25/2013  . Mixed urge and stress incontinence 06/25/2013  . Nocturia 06/25/2013  . Blurring of visual image 04/11/2012    Everlean Alstrom. Graylon Good, PT, DPT 07/31/19, 2:45 PM  [facility] [address] [place], [address] Phone: [phone]   Fax:  [phone]  Name: Deborah Obrien MRN: [id number] Date of Birth: 1940-09-25

## 2019-08-01 ENCOUNTER — Encounter: Payer: Medicare Other | Admitting: Physical Therapy

## 2019-08-05 ENCOUNTER — Encounter: Payer: Medicare Other | Admitting: Physical Therapy

## 2019-08-05 ENCOUNTER — Encounter: Payer: Self-pay | Admitting: Physical Therapy

## 2019-08-05 DIAGNOSIS — R262 Difficulty in walking, not elsewhere classified: Secondary | ICD-10-CM

## 2019-08-05 DIAGNOSIS — G8929 Other chronic pain: Secondary | ICD-10-CM

## 2019-08-05 DIAGNOSIS — M546 Pain in thoracic spine: Secondary | ICD-10-CM

## 2019-08-05 DIAGNOSIS — M25512 Pain in left shoulder: Secondary | ICD-10-CM

## 2019-08-05 DIAGNOSIS — M6281 Muscle weakness (generalized): Secondary | ICD-10-CM

## 2019-08-05 NOTE — Therapy (Signed)
[facility] [address] [place], [address] Phone: [phone]   Fax:  [phone]  Physical Therapy No-Visit Discharge Summary Reporting period: 05/29/2019 - 08/05/2019  Patient Details  Name: Deborah Obrien MRN: [id number] Date of Birth: 1941-01-12 Referring Provider (PT): Eugene Garnet, MD   Encounter Date: 08/05/2019    Past Medical History:  Diagnosis Date  . A-fib ([place]) 2016  . Acid reflux   . Anxiety   . Bowel obstruction ([place])   . CHF (congestive heart failure) ([place]) 2017  . Chronic kidney disease (CKD) stage G3a/A1, moderately decreased glomerular filtration rate (GFR) between 45-59 mL/min/1.73 square meter and albuminuria creatinine ratio less than 30 mg/g   . Diabetes mellitus without complication ([place])   . Dyspnea   . Fatty liver   . GI bleed   . Gout   . Hypertension   . Morbid obesity ([place])   . Osteoarthritis   . Osteoporosis   . Paroxysmal atrial fibrillation (HCC)   . Presence of permanent cardiac pacemaker   . Sinus node dysfunction ([place]) 08/24/2017  . TIA (transient ischemic attack)     Past Surgical History:  Procedure Laterality Date  . ABDOMINAL HYSTERECTOMY  1980  . APPENDECTOMY    . BACK SURGERY     2008  . BACK SURGERY  2019   patient describes kyphoplasty for compression fractures, MD referral said fusion  . BREAST BIOPSY Right 08/16   stereo fibroadenomatous change, neg for atypia  . BREAST EXCISIONAL BIOPSY Left    neg  . CARDIAC CATHETERIZATION Left 08/25/2016   Procedure: Left Heart Cath and Coronary Angiography;  Surgeon: Corey Skains, MD;  Location: [facility];  Service: Cardiovascular;  Laterality: Left;  . CARDIOVERSION N/A 06/28/2017   Procedure: CARDIOVERSION;  Surgeon: Corey Skains, MD;  Location: [facility];  Service: Cardiovascular;  Laterality: N/A;  . CARDIOVERSION N/A 02/06/2018   Procedure: CARDIOVERSION;  Surgeon: Josue Hector, MD;  Location: [facility];  Service: Cardiovascular;  Laterality: N/A;  . CATARACT EXTRACTION, BILATERAL  2012  . CHOLECYSTECTOMY    . colon blockage  1999  . COLON SURGERY  1999  . COLONOSCOPY  2016   polyps removed 2016  . DORSAL COMPARTMENT RELEASE Left 09/14/2016   Procedure: RELEASE DORSAL COMPARTMENT (DEQUERVAIN);  Surgeon: Dereck Leep, MD;  Location: [facility];  Service: Orthopedics;  Laterality: Left;  . ESOPHAGOGASTRODUODENOSCOPY N/A 01/27/2015   Procedure: ESOPHAGOGASTRODUODENOSCOPY (EGD);  Surgeon: Lollie Sails, MD;  Location: [facility];  Service: Endoscopy;  Laterality: N/A;  . ESOPHAGOGASTRODUODENOSCOPY (EGD) WITH PROPOFOL N/A 07/24/2015   Procedure: ESOPHAGOGASTRODUODENOSCOPY (EGD) WITH PROPOFOL;  Surgeon: Lucilla Lame, MD;  Location: [facility];  Service: Endoscopy;  Laterality: N/A;  . ESOPHAGOGASTRODUODENOSCOPY (EGD) WITH PROPOFOL N/A 04/12/2018   Procedure: ESOPHAGOGASTRODUODENOSCOPY (EGD) WITH PROPOFOL;  Surgeon: Jonathon Bellows, MD;  Location: [facility]COPY;  Service: Gastroenterology;  Laterality: N/A;  . ESOPHAGOGASTRODUODENOSCOPY (EGD) WITH PROPOFOL N/A 06/22/2018   Procedure: ESOPHAGOGASTRODUODENOSCOPY (EGD) WITH PROPOFOL;  Surgeon: [name], MD;  Location: [facility]COPY;  Service: Endoscopy;  Laterality: N/A;  . ESOPHAGOGASTRODUODENOSCOPY (EGD) WITH PROPOFOL N/A 07/09/2018   Procedure: ESOPHAGOGASTRODUODENOSCOPY (EGD) WITH PROPOFOL with resection of gastric polyps;  Surgeon: Jonathon Bellows, MD;  Location: [facility] ENDOSCOPY;  Service: Gastroenterology;  Laterality: N/A;  . ESOPHAGOGASTRODUODENOSCOPY (EGD) WITH PROPOFOL N/A 05/17/2019   Procedure: ESOPHAGOGASTRODUODENOSCOPY (EGD) WITH PROPOFOL;  Surgeon: Jonathon Bellows, MD;  Location: [facility] ENDOSCOPY;  Service: Gastroenterology;  Laterality:  N/A;  . EUS N/A 06/22/2018   Procedure: UPPER ENDOSCOPIC ULTRASOUND (EUS) RADIAL;  Surgeon: [name], MD;  Location: [facility] ENDOSCOPY;  Service: Endoscopy;  Laterality:  N/A;  . JOINT REPLACEMENT  2014   Bilateral Knee replacement  . LEAD REVISION/REPAIR N/A 08/29/2017   Procedure: LEAD REVISION/REPAIR;  Surgeon: Constance Haw, MD;  Location: [facility];  Service: Cardiovascular;  Laterality: N/A;  . OOPHORECTOMY    . PACEMAKER INSERTION Left 07/01/2015   Procedure: INSERTION PACEMAKER;  Surgeon: Isaias Cowman, MD;  Location: [facility];  Service: Cardiovascular;  Laterality: Left;  . PACEMAKER REVISION N/A 08/28/2017   Procedure: PACEMAKER REVISION;  Surgeon: Deboraha Sprang, MD;  Location: [facility];  Service: Cardiovascular;  Laterality: N/A;  . REPLACEMENT TOTAL KNEE BILATERAL    . SHOULDER ARTHROSCOPY WITH SUBACROMIAL DECOMPRESSION Left 2013  . TEE WITHOUT CARDIOVERSION N/A 06/28/2017   Procedure: TRANSESOPHAGEAL ECHOCARDIOGRAM (TEE);  Surgeon: Corey Skains, MD;  Location: [facility];  Service: Cardiovascular;  Laterality: N/A;    There were no vitals filed for this visit.  Subjective Assessment - 08/05/19 1859    Subjective  Spoke to patient by phone. She states she had a follow up with her referring clinician who reccomended she discontinue physical therapy if she felt confident doing the exercises and walking at home. Patient states she does and feels confident in discharge. Also received order from referring clinician to discontinue physical therapy    Patient is accompained by:  Family member   Deborah Obrien   Pertinent History  Patient is a 78 y.o. female who presents to outpatient physical therapy with a referral for secondary kyphosis of thoracic region. This patient's chief complaints consist of pain, inability to stand up straight, poor activity tolerance following recently rib fracture at 7th rib at R 04/02/2019. Patient also has a fall history that being dropped at rehab facility leading to 2 compression fractures that required kyphoplasty in late 2019. The above history leading to the following functional  deficits: difficulty with ADLs, IADLs, bed mobility, transfers, household and community mobility, social and community participation.  Relevant past medical history and comorbidities include SOB with exertion; shingles (2020), chronic kidney disease (between stage III-IV according to pt), diabetes (last a1c around 7), stomach polyp that was recently surgically repaired, obesity, Hx of B rotator cuff tears (with repairs), history of 2 strokes, pacemaker with revision(properly functioning per pt 05/29/2019), achy joints, bilateral knee replacement, gout, dypsnea, acid reflux, CHF, GI bleed, hypertension, afib w/history of cardioversion, osteoporosis, repeated hospitalization with decreased endurance and functional mobility, wide range of allergies. Recent surgical procedures (in 2019): cardioversion, esophagogastroduendoscopy with propofol (x2), back surgery (pt describes kyphoplasty at 2 levels, and she mentioned prior fusion), eye surgery, diabetes, no seizures. See chart for extensive past medical history.    Limitations  Sitting;Lifting;Standing;Walking;House hold activities    How long can you sit comfortably?  30 min    How long can you stand comfortably?  1-1.5 minutes    How long can you walk comfortably?  less than 20 feet    Diagnostic tests  04/02/2019 CI chest IMPRESSION: Mildly displaced right seventh rib fracture. Scheduled bone density exam on 06/07/2019    Patient Stated Goals  Walking longer distance, able to play with her grandchildren as well a reduce of her back pain.    Pain Onset  More than a month ago       OBJECTIVE Patient is not present for examination at  this time. Please see previous documentation for latest objective data.      PT Short Term Goals - 07/03/19 1152      PT SHORT TERM GOAL #1   Title  Be independent with intial home exercise program completed at least 3 times per week for self-management of symptoms.    Baseline  initial HEP provided at IE (09/16);     Time  4    Period  Weeks    Status  Achieved    Target Date  06/26/19        PT Long Term Goals - 08/05/19 1901      PT LONG TERM GOAL #1   Title  Be independent with a long-term home exercise program for self-management of symptoms.    Baseline  initial HEP provided at IE (05/29/2019); Continue progress HEP (07/03/2019)    Time  12    Period  Weeks    Status  Achieved    Target Date  08/21/19      PT LONG TERM GOAL #2   Title  Patient will be able to ambulate > 200 feet with LRAD for improved community mobility    Baseline  20 feet (05/29/2019); 163 feet with SBA  (07/03/2019)    Time  8    Period  Weeks    Status  Partially Met    Target Date  08/21/19      PT LONG TERM GOAL #3   Title  Reduce pain with functional activities to equal or less than 3/10 to allow patient to be more independent with usual activities including ADLs, IADLs, and social engagement with less difficulty.     Baseline  10/10 (05/29/2019); 4/10 (07/03/2019)    Time  8    Period  Weeks    Status  Partially Met    Target Date  07/24/19      PT LONG TERM GOAL #4   Title  Be able to complete 5 sit to stand test in 15 seconds to improve functional mobility for transfers and community participation.    Baseline  37.5s for 5 STS (05/29/2019); 20s with BUEs support at thigh (07/03/2019);    Time  8    Period  Weeks    Status  Partially Met    Target Date  07/24/19      PT LONG TERM GOAL #5   Title  Patient will be able to complete 10 feet TUG within 20s to improve fuctional mobility for transfers and community participation.    Baseline  63s (05/29/2019); 27 seconds with rollator (07/03/2019)    Time  12    Period  Weeks    Status  Partially Met    Target Date  08/21/19      PT LONG TERM GOAL #6   Title  Demonstrate improved FOTO score by 10 units to demonstrate improvement in overall condition and self-reported functional ability.    Baseline  FOTO 30; MCD 10 (05/29/2019); Deferred 06/24/2019     Time  12    Period  Weeks    Status  Unable to assess    Target Date  08/21/19         Plan - 08/05/19 1908    Clinical Impression Statement  Patient attended 14 skilled physical therapy treatment sessions this episode of care and overall presents with great progress towards stated goals. Subjectively, patient reports great improvements from day 1 using wheelchair to being able to get around with rollator.  She reported being able to do more housework at home and felt pretty happy about her progress. She feels confident in continuing her HEP independently and her referring clinician recommended discharge from physical therapy for this reason. Objectively, patient demonstrates great progression in walking distance and speed (47mn walking test, TUG), LE strength and power (5TSTS), and pain free AROM at cervical region.  Patient continues to present with significant strength, balance, pain, activity tolerance, high fall risk impairments that are limiting ability to complete her usual ADLs, IADLs, community participation, care for others, manage her home, bend, lift, ambulate community distances and complete transfers without difficulty or falling. Patient is now discharged from physical therapy due to progress towards goals, patient desire to discharge, and referring clinician discontinuing physical therapy care. Patient was provided with an HEP appropriate for her level of ability at this point.    Personal Factors and Comorbidities  Age;Time since onset of injury/illness/exacerbation;Comorbidity 3+;Fitness    Comorbidities  SOB with exertion; shingles (2020), chronic kidney disease (between stage III-IV according to pt), diabetes (last a1c around 7), stomach polyp that was recently surgically repaired, obesity, Hx of B rotator cuff tears (with repairs), history of 2 strokes, pacemaker with revision(properly functioning per pt 05/29/2019), achy joints, bilateral knee replacement, gout, dypsnea, acid  reflux, CHF, GI bleed, hypertension, afib w/history of cardioversion, osteoporosis, repeated hospitalization with decreased endurance and functional mobility, wide range of allergies. Recent surgical procedures (in 2019): cardioversion, esophagogastroduendoscopy with propofol (x2), back surgery (pt describes kyphoplasty at 2 levels, and she mentioned prior fusion), eye surgery, diabetes, no seizures. See chart for extensive past medical history.    Examination-Activity Limitations  Dressing;Sleep;Sit;Carry;Bathing;Squat;Bed Mobility;Hygiene/Grooming;Lift;Stairs;Bend;Stand;Caring for Others;Reach Overhead;Transfers;Locomotion Level;Toileting;Continence    Examination-Participation Restrictions  Cleaning;Shop;Laundry;Community Activity;Meal Prep;Yard Work;Driving;Interpersonal Relationship    Stability/Clinical Decision Making  Evolving/Moderate complexity    Rehab Potential  Fair    PT Frequency  2x / week    PT Duration  12 weeks    PT Treatment/Interventions  Cryotherapy;ADLs/Self Care Home Management;Moist Heat;Gait training;Stair training;Functional mobility training;Therapeutic activities;Therapeutic exercise;Balance training;Neuromuscular re-education;Wheelchair mobility training;Patient/family education;Passive range of motion;Manual techniques;Dry needling;Energy conservation;Aquatic Therapy;Other (comment)   Joint mobilizations grades I-IV   PT Next Visit Plan  Patient is now discharged from physical therapy due to progress towards goals, patient desire to discharge, and referring clinician discontinuing physical therapy care.    PT Home Exercise Plan  [employer]: [id number]    [id number] Agree with Plan of Care  Patient       Patient will benefit from skilled therapeutic intervention in order to improve the following deficits and impairments:  Abnormal gait, Decreased mobility, Decreased coordination, Increased muscle spasms, Postural dysfunction, Pain, Improper body mechanics, Decreased  activity tolerance, Decreased endurance, Decreased range of motion, Decreased strength, Hypomobility, Impaired perceived functional ability, Impaired UE functional use, Decreased balance, Difficulty walking, Decreased safety awareness, Impaired flexibility, Obesity  Visit Diagnosis: Chronic midline low back pain without sciatica  Pain in thoracic spine  Difficulty in walking, not elsewhere classified  Muscle weakness (generalized)  Acute pain of left shoulder     Problem List Patient Active Problem List   Diagnosis Date Noted  . RUQ pain   . UTI (urinary tract infection) 06/14/2018  . Type 2 diabetes mellitus with neurological complications ([place] 024/05/7352 . Acute respiratory failure ([place] 04/09/2018  . Chronic anticoagulation 04/09/2018  . Pacemaker 04/09/2018  . H/O TIA (transient ischemic attack) and stroke 04/09/2018  . CAD (coronary artery disease) 04/09/2018  . Hematemesis 04/05/2018  .  Hypotension 02/04/2018  . Atrial fibrillation with RVR ([place]) 02/04/2018  . Atrial flutter ([place]) 08/24/2017  . Sinus node dysfunction ([place]) 08/24/2017  . Dizziness 06/12/2017  . Mastalgia 02/14/2017  . Acute on chronic renal insufficiency 01/09/2017  . Fatty liver 01/09/2017  . Benign essential HTN 09/08/2016  . Nephrolithiasis 02/22/2016  . Diabetic neuropathy with neurologic complication ([place]) 56/94/3700  . Moderate episode of recurrent major depressive disorder ([place]) 08/11/2015  . Gastritis without bleeding   . Gastric polyp   . Gastro-esophageal reflux disease without esophagitis   . PAF (paroxysmal atrial fibrillation) ([place]) 06/29/2015  . Intestinal obstruction ([place]) 02/20/2015  . DDD (degenerative disc disease), lumbar 01/19/2015  . Back pain 01/12/2015  . Gout 01/12/2015  . Complete tear of left rotator cuff 10/16/2014  . Intervertebral disc disorder with radiculopathy of lumbar region 09/17/2014  . Lumbar radiculitis 09/17/2014  . Lumbar stenosis with neurogenic  claudication 09/17/2014  . Neuritis or radiculitis due to rupture of lumbar intervertebral disc 09/17/2014  . Atherosclerosis of both carotid arteries 07/15/2014  . Essential hypertension 07/15/2014  . Hyperlipemia, mixed 07/15/2014  . 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 Body mass index (BMI) of 40.0-44.9 in adult ([place]) 06/21/2014  . Mixed incontinence 06/25/2013  . Mixed urge and stress incontinence 06/25/2013  . Nocturia 06/25/2013  . Blurring of visual image 04/11/2012    Everlean Alstrom. Graylon Good, PT, DPT 08/05/19, 7:09 PM   [facility] [address] [place], [address] Phone: [phone]   Fax:  [phone]  Name: CHELBY SALATA MRN: [id number] Date of Birth: 1941-07-23

## 2019-08-06 ENCOUNTER — Ambulatory Visit: Payer: Medicare Other | Admitting: Physical Therapy

## 2019-08-06 ENCOUNTER — Encounter: Payer: Medicare Other | Admitting: Physical Therapy

## 2019-08-07 ENCOUNTER — Encounter: Payer: Medicare Other | Admitting: Physical Therapy

## 2019-08-13 ENCOUNTER — Encounter: Payer: Medicare Other | Admitting: Physical Therapy

## 2019-08-14 ENCOUNTER — Ambulatory Visit: Admission: RE | Admit: 2019-08-14 | Payer: Medicare Other | Source: Ambulatory Visit

## 2019-08-15 ENCOUNTER — Encounter: Payer: Medicare Other | Admitting: Physical Therapy

## 2019-08-16 ENCOUNTER — Telehealth: Payer: Self-pay | Admitting: *Deleted

## 2019-08-16 NOTE — Telephone Encounter (Signed)
Patient called given negative covid results . 

## 2019-08-19 ENCOUNTER — Telehealth: Payer: Self-pay | Admitting: Internal Medicine

## 2019-08-19 NOTE — Telephone Encounter (Signed)
Patient spouse dropped off Deborah Obrien Patient Assistance Forms to be completed States they will want them back before sending, they have some information to add to it  Placed in nurse box

## 2019-08-20 ENCOUNTER — Other Ambulatory Visit: Payer: Self-pay | Admitting: *Deleted

## 2019-08-20 ENCOUNTER — Ambulatory Visit
Admission: RE | Admit: 2019-08-20 | Discharge: 2019-08-20 | Disposition: A | Discharge status: Home / Self Care | Payer: Medicare Other | Source: Ambulatory Visit | Attending: Internal Medicine | Admitting: Internal Medicine

## 2019-08-20 ENCOUNTER — Encounter: Payer: Medicare Other | Admitting: Physical Therapy

## 2019-08-20 DIAGNOSIS — Z1231 Encounter for screening mammogram for malignant neoplasm of breast: Secondary | ICD-10-CM | POA: Diagnosis present

## 2019-08-20 MED ORDER — APIXABAN 5 MG PO TABS: 5.0000 mg | ORAL_TABLET | Freq: Two times a day (BID) | ORAL | 3 refills | Status: DC

## 2019-08-20 NOTE — Telephone Encounter (Signed)
Forms obtained- waiting for [name] to sign.

## 2019-08-20 NOTE — Telephone Encounter (Signed)
Physician portion of the patient assistance forms have been signed along with a RX. I have placed these forms back at the front desk for pick up.  Attempted to call the patient/ her husband. No answer- I left a message to please call back.

## 2019-08-21 NOTE — Telephone Encounter (Signed)
I spoke with the patient and her husband. They are aware that the patient assistance paperwork for eliquis (physcian portion & RX) have been completed. Paperwork has been placed at our front desk for pick up.  The patient/ her husband voice understanding and are agreeable.

## 2019-08-21 NOTE — Telephone Encounter (Signed)
Patient husband is returning your call 

## 2019-08-22 ENCOUNTER — Encounter: Payer: Medicare Other | Admitting: Physical Therapy

## 2019-08-26 ENCOUNTER — Encounter: Payer: Medicare Other | Admitting: Physical Therapy

## 2019-08-27 ENCOUNTER — Telehealth: Payer: Self-pay | Admitting: Internal Medicine

## 2019-08-27 ENCOUNTER — Ambulatory Visit (INDEPENDENT_AMBULATORY_CARE_PROVIDER_SITE_OTHER): Payer: Medicare Other | Admitting: *Deleted

## 2019-08-27 DIAGNOSIS — I495 Sick sinus syndrome: Secondary | ICD-10-CM

## 2019-08-27 LAB — CUP PACEART REMOTE DEVICE CHECK
Battery Remaining Longevity: 133 mo
Battery Voltage: 2.97 V
Brady Statistic AP VP Percent: 0.03 %
Brady Statistic AP VS Percent: 99.3 %
Brady Statistic AS VP Percent: 0 %
Brady Statistic AS VS Percent: 0.67 %
Brady Statistic RA Percent Paced: 99.33 %
Brady Statistic RV Percent Paced: 0.03 %
Date Time Interrogation Session: 20201215002915
Implantable Lead Implant Date: 20161019
Implantable Lead Implant Date: 20181217
Implantable Lead Location: 753859
Implantable Lead Location: 753860
Implantable Lead Model: 5076
Implantable Lead Model: 5076
Implantable Pulse Generator Implant Date: 20181217
Lead Channel Impedance Value: 342 Ohm
Lead Channel Impedance Value: 380 Ohm
Lead Channel Impedance Value: 418 Ohm
Lead Channel Impedance Value: 437 Ohm
Lead Channel Pacing Threshold Amplitude: 0.625 V
Lead Channel Pacing Threshold Amplitude: 1 V
Lead Channel Pacing Threshold Pulse Width: 0.4 ms
Lead Channel Pacing Threshold Pulse Width: 0.4 ms
Lead Channel Sensing Intrinsic Amplitude: 4.75 mV
Lead Channel Sensing Intrinsic Amplitude: 4.75 mV
Lead Channel Sensing Intrinsic Amplitude: 6.25 mV
Lead Channel Sensing Intrinsic Amplitude: 6.25 mV
Lead Channel Setting Pacing Amplitude: 2 V
Lead Channel Setting Pacing Amplitude: 2.5 V
Lead Channel Setting Pacing Pulse Width: 0.4 ms
Lead Channel Setting Sensing Sensitivity: 1.2 mV

## 2019-08-27 NOTE — Telephone Encounter (Signed)
°  1. Has your device fired? no  2. Is you device beeping? Yes and then turns orange  3. Are you experiencing draining or swelling at device site? no  4. Are you calling to see if we received your device transmission? Having trouble completing device check  5. Have you passed out? no    Please route to [place]

## 2019-08-27 NOTE — Telephone Encounter (Signed)
I let the pt that we did receive a transmission from her home monitor. I gave her the number to [place] support to get additional help about the monitor turning orange.

## 2019-08-28 ENCOUNTER — Encounter: Payer: Medicare Other | Admitting: Physical Therapy

## 2019-08-29 ENCOUNTER — Encounter: Payer: Medicare Other | Admitting: Physical Therapy

## 2019-09-02 ENCOUNTER — Encounter: Payer: Medicare Other | Admitting: Physical Therapy

## 2019-09-04 ENCOUNTER — Encounter: Payer: Medicare Other | Admitting: Physical Therapy

## 2019-09-09 ENCOUNTER — Encounter: Payer: Medicare Other | Admitting: Physical Therapy

## 2019-09-12 ENCOUNTER — Encounter: Payer: Medicare Other | Admitting: Physical Therapy

## 2019-09-27 ENCOUNTER — Telehealth: Payer: Self-pay | Admitting: Internal Medicine

## 2019-09-27 NOTE — Telephone Encounter (Signed)
Fax received from [place] stating they had received the patient's application for Eliquis and this is now under review.   Copy of the fax has been placed in the file cabinet for patient assistance paperwork in the drug closet.

## 2019-09-28 NOTE — Progress Notes (Signed)
PPM remote 

## 2019-10-03 ENCOUNTER — Encounter: Payer: Self-pay | Admitting: Internal Medicine

## 2019-10-03 ENCOUNTER — Ambulatory Visit (INDEPENDENT_AMBULATORY_CARE_PROVIDER_SITE_OTHER): Payer: Medicare Other | Admitting: Internal Medicine

## 2019-10-03 ENCOUNTER — Other Ambulatory Visit: Payer: Self-pay

## 2019-10-03 VITALS — BP 120/70 | HR 65 | Ht 65.0 in | Wt 219.0 lb

## 2019-10-03 DIAGNOSIS — I495 Sick sinus syndrome: Secondary | ICD-10-CM | POA: Diagnosis not present

## 2019-10-03 DIAGNOSIS — I480 Paroxysmal atrial fibrillation: Secondary | ICD-10-CM | POA: Diagnosis not present

## 2019-10-03 DIAGNOSIS — Z950 Presence of cardiac pacemaker: Secondary | ICD-10-CM

## 2019-10-03 DIAGNOSIS — Z79899 Other long term (current) drug therapy: Secondary | ICD-10-CM | POA: Diagnosis not present

## 2019-10-03 NOTE — Progress Notes (Signed)
Patient Care Team: [name], MD as PCP - General (Internal Medicine) Deboraha Sprang, MD as PCP - Cardiology (Cardiology) [name], MD (Internal Medicine) Bary Castilla Forest Gleason, MD (General Surgery)   HPI  Deborah Obrien is a 79 y.o. female Seen for sinus node dysfunction exercise intolerance and previously implanted single chamber pacemaker for which she underwent dual chamber upgrade 12/18 cx by encircling of the chronic RV lead by the atrial lead resulting in tension for which she underwent RA lead revision   She has hx of atrial arrhythmias, with atrial fib and flutter, typical/atypical all documented.     Admitted 5/19 with recurrent atrial flutter complicated by congestive heart failure.  She was restarted on amiodarone (having been discontinued 12/18) and discharged on a diuretic.  August 2019  GI bleeding.   >> Recurrent atrial flutter 9/19 pace terminated in the office.        11/16  Echo  EF 55-60   12/17 Echo  EF 45% Stress neg  12/17 Cath  Nonobstructive CAD  Mr 2+  2/19 Myoview EF 55-65% No ischemia  7/19 Echo  EF 60-65% Mild RVE     Date Cr K Hgb TSH LFTs  10/18 1.72  13.5    12/18 1.32  13.5    6/19 1.57 5.5>>4.0 13.7 2.95 71(5/19)  8/19 1.53 3.8 11.8>>12.7  14  10/19   12.2 (11/19)  20  1/20 1.94 4.3 11.5 10.67 21  5/20 1.6 4.8  4.135 50  1/21 1.6<<2.1 4.7 12.1 (10/20) 7.133 33   Patient denies symptoms of GI intolerance, sun sensitivity, neurological symptoms attributable to amiodarone.  She does have a low-grade cough nonhigh resolution CT scan 7/20 no reports on pulmonary fibrosis  Remains limited because of her back.  No edema.  Breathing stable.  No chest pain.  Significant pain in her left shoulder  Past Medical History:  Diagnosis Date  . A-fib ([place]) 2016  . Acid reflux   . Anxiety   . Bowel obstruction ([place])   . CHF (congestive heart failure) ([place]) 2017  . Chronic kidney disease (CKD) stage G3a/A1, moderately  decreased glomerular filtration rate (GFR) between 45-59 mL/min/1.73 square meter and albuminuria creatinine ratio less than 30 mg/g   . Diabetes mellitus without complication ([place])   . Dyspnea   . Fatty liver   . GI bleed   . Gout   . Hypertension   . Morbid obesity ([place])   . Osteoarthritis   . Osteoporosis   . Paroxysmal atrial fibrillation ([facility])   . Presence of permanent cardiac pacemaker   . Sinus node dysfunction ([place]) 08/24/2017  . TIA (transient ischemic attack)     Past Surgical History:  Procedure Laterality Date  . ABDOMINAL HYSTERECTOMY  1980  . APPENDECTOMY    . BACK SURGERY     2008  . BACK SURGERY  2019   patient describes kyphoplasty for compression fractures, MD referral said fusion  . BREAST BIOPSY Right 08/16   stereo fibroadenomatous change, neg for atypia  . BREAST EXCISIONAL BIOPSY Left    neg  . CARDIAC CATHETERIZATION Left 08/25/2016   Procedure: Left Heart Cath and Coronary Angiography;  Surgeon: Corey Skains, MD;  Location: [facility];  Service: Cardiovascular;  Laterality: Left;  . CARDIOVERSION N/A 06/28/2017   Procedure: CARDIOVERSION;  Surgeon: Corey Skains, MD;  Location: [facility];  Service: Cardiovascular;  Laterality: N/A;  . CARDIOVERSION N/A 02/06/2018  Procedure: CARDIOVERSION;  Surgeon: Josue Hector, MD;  Location: [facility];  Service: Cardiovascular;  Laterality: N/A;  . CATARACT EXTRACTION, BILATERAL  2012  . CHOLECYSTECTOMY    . colon blockage  1999  . COLON SURGERY  1999  . COLONOSCOPY  2016   polyps removed 2016  . DORSAL COMPARTMENT RELEASE Left 09/14/2016   Procedure: RELEASE DORSAL COMPARTMENT (DEQUERVAIN);  Surgeon: Dereck Leep, MD;  Location: [facility];  Service: Orthopedics;  Laterality: Left;  . ESOPHAGOGASTRODUODENOSCOPY N/A 01/27/2015   Procedure: ESOPHAGOGASTRODUODENOSCOPY (EGD);  Surgeon: Lollie Sails, MD;  Location: [facility] ENDOSCOPY;  Service: Endoscopy;  Laterality: N/A;  .  ESOPHAGOGASTRODUODENOSCOPY (EGD) WITH PROPOFOL N/A 07/24/2015   Procedure: ESOPHAGOGASTRODUODENOSCOPY (EGD) WITH PROPOFOL;  Surgeon: Lucilla Lame, MD;  Location: [facility]OSCOPY;  Service: Endoscopy;  Laterality: N/A;  . ESOPHAGOGASTRODUODENOSCOPY (EGD) WITH PROPOFOL N/A 04/12/2018   Procedure: ESOPHAGOGASTRODUODENOSCOPY (EGD) WITH PROPOFOL;  Surgeon: Jonathon Bellows, MD;  Location: [facility] ENDOSCOPY;  Service: Gastroenterology;  Laterality: N/A;  . ESOPHAGOGASTRODUODENOSCOPY (EGD) WITH PROPOFOL N/A 06/22/2018   Procedure: ESOPHAGOGASTRODUODENOSCOPY (EGD) WITH PROPOFOL;  Surgeon: [name], MD;  Location: [facility] ENDOSCOPY;  Service: Endoscopy;  Laterality: N/A;  . ESOPHAGOGASTRODUODENOSCOPY (EGD) WITH PROPOFOL N/A 07/09/2018   Procedure: ESOPHAGOGASTRODUODENOSCOPY (EGD) WITH PROPOFOL with resection of gastric polyps;  Surgeon: Jonathon Bellows, MD;  Location: [facility]COPY;  Service: Gastroenterology;  Laterality: N/A;  . ESOPHAGOGASTRODUODENOSCOPY (EGD) WITH PROPOFOL N/A 05/17/2019   Procedure: ESOPHAGOGASTRODUODENOSCOPY (EGD) WITH PROPOFOL;  Surgeon: Jonathon Bellows, MD;  Location: [facility] ENDOSCOPY;  Service: Gastroenterology;  Laterality: N/A;  . EUS N/A 06/22/2018   Procedure: UPPER ENDOSCOPIC ULTRASOUND (EUS) RADIAL;  Surgeon: [name], MD;  Location: [facility] ENDOSCOPY;  Service: Endoscopy;  Laterality: N/A;  . JOINT REPLACEMENT  2014   Bilateral Knee replacement  . LEAD REVISION/REPAIR N/A 08/29/2017   Procedure: LEAD REVISION/REPAIR;  Surgeon: Constance Haw, MD;  Location: [facility];  Service: Cardiovascular;  Laterality: N/A;  . OOPHORECTOMY    . PACEMAKER INSERTION Left 07/01/2015   Procedure: INSERTION PACEMAKER;  Surgeon: Isaias Cowman, MD;  Location: [facility];  Service: Cardiovascular;  Laterality: Left;  . PACEMAKER REVISION N/A 08/28/2017   Procedure: PACEMAKER REVISION;  Surgeon: Deboraha Sprang, MD;  Location: [facility];  Service: Cardiovascular;  Laterality: N/A;    . REPLACEMENT TOTAL KNEE BILATERAL    . SHOULDER ARTHROSCOPY WITH SUBACROMIAL DECOMPRESSION Left 2013  . TEE WITHOUT CARDIOVERSION N/A 06/28/2017   Procedure: TRANSESOPHAGEAL ECHOCARDIOGRAM (TEE);  Surgeon: Corey Skains, MD;  Location: [facility];  Service: Cardiovascular;  Laterality: N/A;    Current Outpatient Medications  Medication Sig Dispense Refill  . allopurinol (ZYLOPRIM) 100 MG tablet Take 150 mg by mouth daily.     Marland Kitchen amiodarone (PACERONE) 200 MG tablet Take 1 tablet (200 mg total) by mouth daily.    Marland Kitchen apixaban (ELIQUIS) 5 MG TABS tablet Take 1 tablet (5 mg total) by mouth 2 (two) times daily. 180 tablet 3  . benzocaine-Menthol (DERMOPLAST) 20-0.5 % AERO Apply 1 application topically 4 (four) times daily as needed for irritation.    . bisacodyl (DULCOLAX) 10 MG suppository Place 1 suppository (10 mg total) rectally daily as needed for moderate constipation. 12 suppository 0  . busPIRone (BUSPAR) 10 MG tablet Take 10 mg by mouth 2 (two) times daily.     . carboxymethylcellulose (REFRESH PLUS) 0.5 % SOLN 1 drop 2 (two) times daily as needed.    . colchicine 0.6 MG tablet Take 0.6 mg  by mouth daily as needed (gout).     . Cyanocobalamin 2500 MCG SUBL Place under the tongue daily.    . dibucaine (NUPERCAINAL) 1 % OINT Place 1 application rectally as needed for hemorrhoids.  0  . DULoxetine (CYMBALTA) 30 MG capsule Take by mouth daily.    . furosemide (LASIX) 20 MG tablet Take 20 mg by mouth daily.    Marland Kitchen gabapentin (NEURONTIN) 600 MG tablet Take 600 mg by mouth 3 (three) times daily.    Marland Kitchen HYDROcodone-acetaminophen (NORCO) 10-325 MG tablet Take 1 tablet by mouth every 6 (six) hours as needed.    . hydrocortisone 2.5 % cream Apply 1 application topically daily as needed (rash).    . insulin NPH-regular Human (NOVOLIN 70/30) (70-30) 100 UNIT/ML injection Inject 22 Units into the skin 2 (two) times daily.     Marland Kitchen levothyroxine (SYNTHROID) 50 MCG tablet Take 50 mcg by mouth daily before  breakfast.    . neomycin-bacitracin-polymyxin (NEOSPORIN) ointment Apply topically as needed for wound care. 15 g 0  . NON FORMULARY Diet Type: Mechanical Soft Chopped    . oxybutynin (DITROPAN) 5 MG tablet Take 10 mg by mouth 2 (two) times daily.    . pantoprazole (PROTONIX) 40 MG tablet Take 1 tablet by mouth twice daily (Patient taking differently: Take 40 mg by mouth daily. Once a day) 180 tablet 1  . Polyethyl Glycol-Propyl Glycol (SYSTANE) 0.4-0.3 % SOLN Place 1 drop into both eyes 2 (two) times daily as needed.    . pravastatin (PRAVACHOL) 20 MG tablet Take 20 mg by mouth every evening.  2  . senna-docusate (SENOKOT-S) 8.6-50 MG tablet Take 1 tablet by mouth every morning.    . talc (ZEASORB) powder Apply 1 application topically as needed (under the breast irritation).    . traZODone (DESYREL) 50 MG tablet Take 50-100 mg by mouth at bedtime.     . vitamin C (ASCORBIC ACID) 500 MG tablet Take 500 mg by mouth 2 (two) times daily.    . sucralfate (CARAFATE) 1 GM/10ML suspension Take 10 mLs (1 g total) by mouth 4 (four) times daily. 1200 mL 2   No current facility-administered medications for this visit.    Allergies  Allergen Reactions  . Cephalosporins Other (See Comments)    Reaction:  Unknown   . Fish Allergy Shortness Of Breath  . Macrolides And Ketolides Other (See Comments)    Reaction:  Unknown  Other Reaction: Intolerance Other Reaction: Intolerance   . Meperidine Shortness Of Breath, Nausea Only and Other (See Comments)    Reaction:  Stomach pain   . Other Nausea Only, Rash and Other (See Comments)    Uncoded Allergy. Allergen: NON-STEROIDS, Other Reaction: Not Assessed bextra - hands and feet swell Shellfish - SOB Shellfish - SOB Other Reaction: Not Assessed  . Prednisone Anaphylaxis and Other (See Comments)    Reaction:  Increases pts blood sugar  Pt states that she is allergic to all steroids.    . Shellfish Allergy Anaphylaxis, Shortness Of Breath, Diarrhea,  Nausea Only and Rash    Reaction:  Stomach pain  Other reaction(s): Other (See Comments) Reaction:Stomach pain   . Sulfa Antibiotics Shortness Of Breath, Diarrhea, Nausea Only and Other (See Comments)    Reaction:  Stomach pain  Reaction:Stomach pain  Other Reaction: Intolerance   . Sulfacetamide Sodium Diarrhea, Nausea Only, Other (See Comments) and Shortness Of Breath    Reaction:  Stomach pain   . Telbivudine     Other  reaction(s): Other (See Comments) Reaction:Unknown   . Uloric [Febuxostat] Anaphylaxis    Locks pt's body up   . Aspirin Other (See Comments)    Reaction:  Stomach pain   . Celecoxib Other (See Comments)    Reaction:  GI bleed, weakness, and stomach pain.    . Cephalexin Diarrhea, Nausea Only and Other (See Comments)    Reaction:  Stomach pain   . Erythromycin Diarrhea, Nausea Only and Other (See Comments)    Reaction:  Stomach pain   . Hydromorphone Other (See Comments)    Reaction:  Hypotension   . Iodinated Diagnostic Agents Rash and Other (See Comments)    Pt states that she is unable to have because she has chronic kidney disease.   Pt states that she is unable to have because she has chronic kidney disease. CKD CKD  . Oxycodone Other (See Comments)    Reaction:  Stomach pain   . Aleve [Naproxen]     Reaction: severe stomach pain  . Atorvastatin Nausea Only and Other (See Comments)    Reaction:  Weakness   . Codeine Diarrhea, Nausea Only and Other (See Comments)    Reaction:  Stomach pain Pt tolerates morphine   . Dilaudid [Hydromorphone Hcl]     Reactions: easy to overdose - blood pressure drops really low  . Doxycycline     Stomach pain   . Lipitor [Atorvastatin Calcium] Nausea Only    Reaction: nausea, weakness, pass blood  . Motrin [Ibuprofen]     Reaction: severe stomach pain  . Tape Other (See Comments)    Reaction:  Causes pts skin to tear  Pt states that she is able to use paper tape.      . Valdecoxib Swelling and Other  (See Comments)    Pt states that her hands and feet swell.    . Iodine Rash      Review of Systems negative except from HPI and PMH  Physical Exam BP 120/70 (BP Location: Right Arm, Patient Position: Sitting, Cuff Size: Large)   Pulse 65   Ht 5\' 5"  (1.651 m)   Wt 219 lb (99.3 kg)   SpO2 95%   BMI 36.44 kg/m  Well developed and obese in obvious discomfort related to her shoulder HENT normal Neck supple  Clear Device pocket well healed; without hematoma or erythema.  There is no tethering  Regular rate and rhythm, no urmur Abd-soft with active BS No Clubbing cyanosis   edema Skin-warm and dry A & Oriented  Grossly normal sensory and motor function  ECG atrial pacing at 65 Interval 24/08/43 Poor R wave progression  Assessment and  Plan  Sinus node dysfunction  Paroxysmal atrial fibrillation/flutter with a rapid ventricular response  High Risk Medication Surveillance--Amiodarone  Cardiomyopathy-interval normalization   Diabetes mellitus with nephropathy  Orthostatic intolerance and falls  HFpEF  Hypotension  Back pain chronic  Left shoulder pain  RV enlargement  Thromboembolic risk profile as noted above   Medtronic pacemaker upgraded to dual chamber The patient's device was interrogated.  The information was reviewed. No changes were made in the programming.     Left shoulder pain-no evidence that this is related to the device i.e. infection  Euvolemic continue current meds  On Anticoagulation;  No bleeding issues   Some cough  Will have to follow for amio  Surveillance labs within range-- x TSH  Elevated, and synthroid increased per Dr Gaylyn Cheers

## 2019-10-03 NOTE — Patient Instructions (Signed)
Medication Instructions:  - Your physician recommends that you continue on your current medications as directed. Please refer to the Current Medication list given to you today.  *If you need a refill on your cardiac medications before your next appointment, please call your pharmacy*  Lab Work: - none ordered  If you have labs (blood work) drawn today and your tests are completely normal, you will receive your results only by: . MyChart Message (if you have MyChart) OR . A paper copy in the mail If you have any lab test that is abnormal or we need to change your treatment, we will call you to review the results.  Testing/Procedures: - none ordered  Follow-Up: At [facility], you and your health needs are our priority.  As part of our continuing mission to provide you with exceptional heart care, we have created designated Provider Care Teams.  These Care Teams include your primary Cardiologist (physician) and Advanced Practice Providers (APPs -  Physician Assistants and Nurse Practitioners) who all work together to provide you with the care you need, when you need it.  Your next appointment:   6 months  The format for your next appointment:   In Person  Provider:   Steven Klein, MD  Other Instructions - n/a  

## 2019-10-16 ENCOUNTER — Telehealth: Payer: Self-pay | Admitting: Internal Medicine

## 2019-10-16 NOTE — Telephone Encounter (Signed)
Pt c/o medication issue:  1. Name of Medication: apixaban (ELIQUIS) 5 MG TABS tablet  2. How are you currently taking this medication (dosage and times per day)? 1 Tablet by mouth 2 times daily   3. Are you having a reaction (difficulty breathing--STAT)? No  4. What is your medication issue? Patient is calling stating the company is no longer offering her this medication for free and she cannot afford it. The patient would like to be switched to a cheaper alternative. She is requesting Alvis Lemmings give her a callback in regards to this. Please advise.

## 2019-10-17 ENCOUNTER — Telehealth: Payer: Self-pay | Admitting: Internal Medicine

## 2019-10-17 NOTE — Telephone Encounter (Signed)
Patient is returning your call.  

## 2019-10-17 NOTE — Telephone Encounter (Signed)
Previous encounter open for the same thing. Will close this encounter.  

## 2019-10-17 NOTE — Telephone Encounter (Signed)
Pt c/o medication issue:  1. Name of Medication: Eliquis   2. How are you currently taking this medication (dosage and times per day)? 5 mg po BID   3. Are you having a reaction (difficulty breathing--STAT)? No   4. What is your medication issue? [employer] assistance for 2021 is now requiring oop pharmacy amount of $500 to be reached.  Patient cannot afford this and wants to know if there is anything [name] would suggest for assistance or changes   Patient has 26 day supply left   Patient wants to relay all of this information to heather before she speaks with [name]

## 2019-10-17 NOTE — Telephone Encounter (Signed)
I attempted to call the patient. °No answer- I left a message to please call back.  °

## 2019-10-17 NOTE — Telephone Encounter (Signed)
Patient husband calling in to check status. Will be most available before 1:30 due to other appointments  Please advise

## 2019-10-17 NOTE — Telephone Encounter (Signed)
I spoke with the patient. She states that [employer] is requiring her to pay $500 out of pocket for her RX drugs prior to her qualifying for Eliquis patient assistance. The patient states she cannot afford this. I have advised her that alternatives are Xarelto and Pradaxa, but they will cost almost as much as the Eliquis as they are not generic yet. She is aware that Warfarin is her least expensive option, but this will require her coming in for finger sticks.  The patient states she was on warfarin prior to eliquis so she is not against going back on this if need be. She states she has about 1 month of eliquis left.  Per the patient, [employer] has recommended 1 other thing to the patient's husband for them to try, but she is not sure they will qualify. She states she will call me back if she needs to switch to warfarin.  I advised the patient I will wait for her call back.

## 2019-10-18 ENCOUNTER — Other Ambulatory Visit (HOSPITAL_COMMUNITY): Payer: Self-pay | Admitting: Orthopedic Surgery

## 2019-10-18 ENCOUNTER — Other Ambulatory Visit: Payer: Self-pay | Admitting: Orthopedic Surgery

## 2019-10-18 DIAGNOSIS — M75102 Unspecified rotator cuff tear or rupture of left shoulder, not specified as traumatic: Secondary | ICD-10-CM

## 2019-10-19 ENCOUNTER — Other Ambulatory Visit: Payer: Self-pay | Admitting: Internal Medicine

## 2019-10-19 DIAGNOSIS — I480 Paroxysmal atrial fibrillation: Secondary | ICD-10-CM

## 2019-10-22 ENCOUNTER — Telehealth: Payer: Self-pay | Admitting: Internal Medicine

## 2019-10-22 NOTE — Telephone Encounter (Signed)
This is a  pt 

## 2019-10-22 NOTE — Telephone Encounter (Signed)
I spoke with the patient and her husband.  1) They advised that pacerone was sent to the pharmacy- I advised this is the same as amiodarone and ok for her to take as prescribed  2) They had labs with a kidney doctor at [facility] and at parathyroid horomone was checked that was high. Dr. Ouida Sills had seen the patient mid January and increased her levothyroxine dose.  I advised they follow up with Dr. Ouida Sills about the parathyroid horomone level as this is outside of our scope  3) They advised that the patient will not qualify for patient assistance with eliquis until ~ July due to the need to pay $500 out of pocket prior for medications. They are aware that we can transition back to warfarin and are agreeable. The patient currently has ~ 15 days worth of Eliquis. I have advised I will confirm with [name] that he would start the patient out on Warfarin 5 mg once daily. I have also advised I will speak with our coumadin nurse, Mandi to help [place] with the transition.  They are also aware of the need to come in for INR checks and are agreeable.   They are aware that [name] is out until next week so I will call them back at that time.

## 2019-10-22 NOTE — Telephone Encounter (Signed)
Patient and patient spouse calling - would like to speak with Nira Conn They would like some clarifications on medications State that they might not be able to take Eliquis much longer and have questions in regards to levothyroxine  Please call to discuss

## 2019-10-25 ENCOUNTER — Other Ambulatory Visit: Payer: Self-pay

## 2019-10-25 ENCOUNTER — Ambulatory Visit (HOSPITAL_COMMUNITY)
Admission: RE | Admit: 2019-10-25 | Discharge: 2019-10-25 | Disposition: A | Discharge status: Home / Self Care | Payer: Medicare Other | Source: Ambulatory Visit | Attending: Orthopedic Surgery | Admitting: Orthopedic Surgery

## 2019-10-25 DIAGNOSIS — M75102 Unspecified rotator cuff tear or rupture of left shoulder, not specified as traumatic: Secondary | ICD-10-CM | POA: Insufficient documentation

## 2019-10-28 ENCOUNTER — Other Ambulatory Visit: Payer: Self-pay

## 2019-10-28 ENCOUNTER — Telehealth: Payer: Self-pay | Admitting: Internal Medicine

## 2019-10-28 DIAGNOSIS — I480 Paroxysmal atrial fibrillation: Secondary | ICD-10-CM

## 2019-10-28 MED ORDER — AMIODARONE HCL 200 MG PO TABS: 200.0000 mg | ORAL_TABLET | Freq: Every day | ORAL | 0 refills | Status: DC

## 2019-10-28 NOTE — Telephone Encounter (Signed)
Called patient made them aware that medication has been sent to pharmacy.

## 2019-10-28 NOTE — Telephone Encounter (Signed)
Disp Refills Start End   amiodarone (PACERONE) 200 MG tablet 90 tablet 0 10/28/2019    Sig - Route: Take 1 tablet (200 mg total) by mouth daily. - Oral   Sent to pharmacy as: amiodarone (PACERONE) 200 MG tablet   E-Prescribing Status: Sent to pharmacy (10/28/2019 10:27 AM EST)   Associated Diagnoses  PAF (paroxysmal atrial fibrillation) [facility])     Pharmacy  [facility] [place], [place]

## 2019-10-28 NOTE — Telephone Encounter (Signed)
New Message     *STAT* If patient is at the pharmacy, call can be transferred to refill team.   1. Which medications need to be refilled? (please list name of each medication and dose if known) PACERONE 200 MG tablet  2. Which pharmacy/location (including street and city if local pharmacy) is medication to be sent to? [place], [place]  3. Do they need a 30 day or 90 day supply? 73    Pts husband states pt takes 1 tablet daily and is needing the prescription to be sent with these directions   Pts husband would like a call when the prescription has been sent and if he doesn't answer, to leave a message   Please advise

## 2019-10-28 NOTE — Telephone Encounter (Signed)
This is a [place] pt 

## 2019-10-29 NOTE — Telephone Encounter (Signed)
Deborah Obrien- [name] is going to place her on warfarin 5 mg tablets to start her transition over from eliquis.  What should I tell her to do to start this process?

## 2019-10-29 NOTE — Telephone Encounter (Signed)
Typcial transition is just fine  Thanks

## 2019-10-29 NOTE — Telephone Encounter (Signed)
Typically we overlap warfarin and Eliquis for 3 days, then stop Eliquis and just continue on warfarin with new Coumadin visit after 5 days of warfarin for INR check.  Pt is at elevated cardiac risk (CHADS2VASc score is 9 with hx of TIA). Since she's at higher risk of an event, a more exact switch over would be to stop Eliquis and start Lovenox/warfarin bridge.  Will defer to Dr Caryl Comes to see if he would be ok with 3 (or even 4) day DOAC/warfarin overlap vs true warfarin/Lovenox bridge. Of note, she was previously cleared to hold her Eliquis for 3 days for a procedure (but does have reduced Eliquis clearance due to CKD with SCr of 1.94).

## 2019-10-30 MED ORDER — WARFARIN SODIUM 5 MG PO TABS: 5.0000 mg | ORAL_TABLET | Freq: Every day | ORAL | 1 refills | Status: DC

## 2019-10-30 NOTE — Telephone Encounter (Signed)
I called and spoke with the patient and her husband. I have advised them that per [name] Pharmacy we will transition her to warfarin as below:  Today (2/17): take eliquis 5 mg BID & wafarin 5 mg once in the evening Tomorrow (2/18) : take eliquis 5 mg BID & warfarin 5 mg once in the evening Friday (2/19): take elqiuis 5 mg BID & warfarin 5 mg once in the evening Saturday (2/20): stop eliquis & take warfarin 5 mg once in the evening Sunday (2/21) : take warfarin 5 mg once in the evening Monday (2/22): [facility] appt at 1:00 pm for an INR check and f/u dosing of her warfarin  The patient and her husband voice understanding and are agreeable.

## 2019-11-04 ENCOUNTER — Other Ambulatory Visit: Payer: Self-pay

## 2019-11-04 ENCOUNTER — Ambulatory Visit (INDEPENDENT_AMBULATORY_CARE_PROVIDER_SITE_OTHER): Payer: Medicare Other

## 2019-11-04 DIAGNOSIS — I4891 Unspecified atrial fibrillation: Secondary | ICD-10-CM | POA: Diagnosis not present

## 2019-11-04 DIAGNOSIS — Z5181 Encounter for therapeutic drug level monitoring: Secondary | ICD-10-CM

## 2019-11-04 DIAGNOSIS — I480 Paroxysmal atrial fibrillation: Secondary | ICD-10-CM | POA: Diagnosis not present

## 2019-11-04 DIAGNOSIS — Z8673 Personal history of transient ischemic attack (TIA), and cerebral infarction without residual deficits: Secondary | ICD-10-CM | POA: Diagnosis not present

## 2019-11-04 LAB — POCT INR: INR: 1.6 — AB (ref 2.0–3.0)

## 2019-11-04 NOTE — Patient Instructions (Signed)
Take 1.5 tablets warfarin tonight Then continue taking 1 tablet (5 mg) every evening. Recheck in 1 week.

## 2019-11-06 ENCOUNTER — Other Ambulatory Visit: Payer: Self-pay

## 2019-11-06 ENCOUNTER — Ambulatory Visit (INDEPENDENT_AMBULATORY_CARE_PROVIDER_SITE_OTHER): Payer: Medicare Other | Admitting: Gastroenterology

## 2019-11-06 VITALS — BP 110/61 | HR 87 | Temp 98.1°F | Ht 65.0 in | Wt 215.4 lb

## 2019-11-06 DIAGNOSIS — K317 Polyp of stomach and duodenum: Secondary | ICD-10-CM | POA: Diagnosis not present

## 2019-11-06 NOTE — Progress Notes (Signed)
Jonathon Bellows MD, MRCP(U.K) [address]  [place]  [place], [place] [address]  Main: [phone]  Fax: [phone]   Primary Care Physician: [name], MD  Primary Gastroenterologist:  Dr. Jonathon Bellows   Follow-up for gastric polyps and anemia  HPI: Deborah Obrien is a 79 y.o. female    Summary of history :  Deborah Haberland Burgessis a 78y.o.y/o femaleadmittedin mid 2019with a severe upper GI bleed. I performed an upper endoscopy and noted a large gastric polyp that was bleeding. At that point of time I sprayed hemosprayand obtained hemostasis. Deborah Obrien for atrial fibrillation and she has hada history of strokes. She is status post cholecystectomy.. She underwent an USG RUQ which showed a mildly dilated CBD 9 mm, she underwent an [facility] at [facility] after and found no stones in her CBD and was discharged. 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 Features of prior kyphoplasty at t12- L1  EGD 07/09/18 : EGD: 4 large gastric polyps were resected. All were hyperplastic , few other smaller polyps seen but not resected.  05/17/2019: EGD: Notable small sessile polyps resected.  A 15 mm sessile polyp was also resected by EMR.  All polyps were gastric hyperplastic polyps with surface elevation and inflammation were negative for malignancy.  06/18/2019: Hemoglobin 12.1.   Interval history10/19/2020-11/06/2019  10/21/2019 hemoglobin 11.9 g with an MCV of 97. 10/21/2019: BUN of 36 and creatinine of 1.7   She is doing well no abdominal pain.  On warfarin.  Takes Protonix once a day.  Brown-colored stools.  Having issues with her spine and shoulder.  Surgery is being considered.   Current Outpatient Medications  Medication Sig Dispense Refill  . allopurinol (ZYLOPRIM) 100 MG tablet Take 150 mg by mouth daily.       Marland Kitchen amiodarone (PACERONE) 200 MG tablet Take 1 tablet (200 mg total) by mouth daily. 90 tablet 0  . benzocaine-Menthol (DERMOPLAST) 20-0.5 % AERO Apply 1 application topically 4 (four) times daily as needed for irritation.    . bisacodyl (DULCOLAX) 10 MG suppository Place 1 suppository (10 mg total) rectally daily as needed for moderate constipation. 12 suppository 0  . busPIRone (BUSPAR) 10 MG tablet Take 10 mg by mouth 2 (two) times daily.     . Cyanocobalamin 2500 MCG SUBL Place under the tongue daily.    . dibucaine (NUPERCAINAL) 1 % OINT Place 1 application rectally as needed for hemorrhoids.  0  . DULoxetine (CYMBALTA) 30 MG capsule Take by mouth daily.    . furosemide (LASIX) 20 MG tablet Take 20 mg by mouth daily.    Marland Kitchen gabapentin (NEURONTIN) 600 MG tablet Take 600 mg by mouth 3 (three) times daily.    Marland Kitchen HYDROcodone-acetaminophen (NORCO) 10-325 MG tablet Take 1 tablet by mouth every 6 (six) hours as needed.    . hydrocortisone 2.5 % cream Apply 1 application topically daily as needed (rash).    . insulin NPH-regular Human (NOVOLIN 70/30) (70-30) 100 UNIT/ML injection Inject 22 Units into the skin 2 (two) times daily.     Marland Kitchen levothyroxine (SYNTHROID) 50 MCG tablet Take 50 mcg by mouth daily before breakfast.    . neomycin-bacitracin-polymyxin (NEOSPORIN) ointment Apply topically as needed for wound care. 15 g 0  . NON FORMULARY Diet Type: Mechanical Soft Chopped    . oxybutynin (DITROPAN) 5 MG tablet  Take 10 mg by mouth 2 (two) times daily.    . pantoprazole (PROTONIX) 40 MG tablet Take 1 tablet by mouth twice daily (Patient taking differently: Take 40 mg by mouth daily. Once a day) 180 tablet 1  . Polyethyl Glycol-Propyl Glycol (SYSTANE) 0.4-0.3 % SOLN Place 1 drop into both eyes 2 (two) times daily as needed.    . pravastatin (PRAVACHOL) 20 MG tablet Take 20 mg by mouth every evening.  2  . senna-docusate (SENOKOT-S) 8.6-50 MG tablet Take 1 tablet by mouth every morning.    . talc  (ZEASORB) powder Apply 1 application topically as needed (under the breast irritation).    . traZODone (DESYREL) 50 MG tablet Take 50-100 mg by mouth at bedtime.     . vitamin C (ASCORBIC ACID) 500 MG tablet Take 500 mg by mouth 2 (two) times daily.    Marland Kitchen warfarin (COUMADIN) 5 MG tablet Take 1 tablet (5 mg total) by mouth daily at 6 PM. 30 tablet 1  . carboxymethylcellulose (REFRESH PLUS) 0.5 % SOLN 1 drop 2 (two) times daily as needed.    . colchicine 0.6 MG tablet Take 0.6 mg by mouth daily as needed (gout).     . sucralfate (CARAFATE) 1 GM/10ML suspension Take 10 mLs (1 g total) by mouth 4 (four) times daily. 1200 mL 2   No current facility-administered medications for this visit.    Allergies as of 11/06/2019 - Review Complete 11/06/2019  Allergen Reaction Noted  . Cephalosporins Other (See Comments) 01/19/2015  . Fish allergy Shortness Of Breath 11/26/2015  . Macrolides and ketolides Other (See Comments) 01/12/2015  . Meperidine Shortness Of Breath, Nausea Only, and Other (See Comments) 01/19/2015  . Other Nausea Only, Rash, and Other (See Comments) 01/12/2015  . Prednisone Anaphylaxis and Other (See Comments) 01/19/2015  . Shellfish allergy Anaphylaxis, Shortness Of Breath, Diarrhea, Nausea Only, and Rash 06/29/2015  . Sulfa antibiotics Shortness Of Breath, Diarrhea, Nausea Only, and Other (See Comments) 06/25/2013  . Sulfacetamide sodium Diarrhea, Nausea Only, Other (See Comments), and Shortness Of Breath 08/12/2015  . Telbivudine  08/12/2015  . Uloric [febuxostat] Anaphylaxis 08/22/2016  . Aspirin Other (See Comments) 01/19/2015  . Celecoxib Other (See Comments) 01/19/2015  . Cephalexin Diarrhea, Nausea Only, and Other (See Comments) 01/19/2015  . Erythromycin Diarrhea, Nausea Only, and Other (See Comments) 01/19/2015  . Hydromorphone Other (See Comments) 01/19/2015  . Iodinated diagnostic agents Rash and Other (See Comments) 01/19/2015  . Oxycodone Other (See Comments)  01/19/2015  . Aleve [naproxen]  07/03/2018  . Atorvastatin Nausea Only and Other (See Comments) 01/19/2015  . Codeine Diarrhea, Nausea Only, and Other (See Comments) 01/19/2015  . Dilaudid [hydromorphone hcl]  07/03/2018  . Doxycycline  08/24/2017  . Lipitor [atorvastatin calcium] Nausea Only 07/03/2018  . Motrin [ibuprofen]  07/03/2018  . Tape Other (See Comments) 06/29/2015  . Valdecoxib Swelling and Other (See Comments) 01/19/2015  . Iodine Rash 06/29/2015    ROS:  General: Negative for anorexia, weight loss, fever, chills, fatigue, weakness. ENT: Negative for hoarseness, difficulty swallowing , nasal congestion. CV: Negative for chest pain, angina, palpitations, dyspnea on exertion, peripheral edema.  Respiratory: Negative for dyspnea at rest, dyspnea on exertion, cough, sputum, wheezing.  GI: See history of present illness. GU:  Negative for dysuria, hematuria, urinary incontinence, urinary frequency, nocturnal urination.  Endo: Negative for unusual weight change.    Physical Examination:   BP 110/61   Pulse 87   Temp 98.1 F (36.7 C)   Ht  5\' 5"  (1.651 m)   Wt 215 lb 6.4 oz (97.7 kg)   BMI 35.84 kg/m   General: Well-nourished, well-developed in no acute distress.  In a wheelchair Eyes: No icterus. Conjunctivae pink. Mouth: Oropharyngeal mucosa moist and pink , no lesions erythema or exudate. Extremities: No lower extremity edema. No clubbing or deformities. Neuro: Alert and oriented x 3.  Grossly intact.Marland Kitchen   Psych: Alert and cooperative, normal mood and affect.   Imaging Studies: MR SHOULDER LEFT WO CONTRAST  Result Date: 10/25/2019 CLINICAL DATA:  [place]. Left shoulder pain. EXAM: MRI OF THE LEFT SHOULDER WITHOUT CONTRAST TECHNIQUE: Multiplanar, multisequence MR imaging of the shoulder was performed. No intravenous contrast was administered. COMPARISON:  None. FINDINGS: Rotator cuff: There is a small full-thickness retracted supraspinatus tendon tear. Maximum  retraction is 11 mm and the tear is 11 mm wide. Moderate surrounding tendinopathy. The infraspinatus and subscapularis tendons are intact. Muscles:  Unremarkable. Biceps long head: Intact. Fairly significant tendinopathy with diffuse thickening and increased signal intensity but no discrete tear/rupture. Acromioclavicular Joint: Moderate degenerative changes. Type 2 acromion. No significant lateral downsloping or undersurface spurring. Glenohumeral Joint: Mild to moderate degenerative changes with small joint effusion and mild synovitis. Labrum:  No definite labral tears. Bones:  No acute bony findings. Other: Expected fluid in the subacromial/subdeltoid bursa. IMPRESSION: 1. Small full-thickness retracted supraspinatus tendon tear. 2. Significant long head biceps tendinopathy but no discrete tear/rupture. 3. Moderate AC joint degenerative changes but no other significant findings for bony impingement. 4. Mild to moderate glenohumeral joint degenerative changes with small joint effusion and mild synovitis. Electronically Signed   By: Marijo Sanes M.D.   On: 10/25/2019 14:36    Assessment and Plan:   Deborah Obrien is a 79 y.o. y/o female here to follow-up for gastric polyps which caused severe GI bleed in the past requiring resection.  On Obrien s/p EGD and resection of multiple gastric polyps.  Every time we perform an endoscopy she has severe back pain and chest wall pain.  We discussed that moving forward we need to balance the risks of the procedure and the subsequent discomfort she faces versus the benefits of the procedure.    She has severe pain when she lays down due to her back.  I feel at this point of time since her CBC is stable should probably avoid any endoscopy.  If there is concern of bleeding at any point of time we can change our plans.  I will keep a watch on her counts over the next 1 year.   Plan 1.  Recheck CBC and iron studies in June and. 2.  Continue Protonix 40 mg once a day  taken 30 minutes before her breakfast long-term.   Telephone visit beginning of July 2021  Dr Jonathon Bellows  MD,MRCP [facility])

## 2019-11-10 DIAGNOSIS — M7522 Bicipital tendinitis, left shoulder: Secondary | ICD-10-CM

## 2019-11-10 DIAGNOSIS — M19012 Primary osteoarthritis, left shoulder: Secondary | ICD-10-CM | POA: Insufficient documentation

## 2019-11-10 HISTORY — DX: Bicipital tendinitis, left shoulder: M75.22

## 2019-11-11 ENCOUNTER — Telehealth: Payer: Self-pay | Admitting: Internal Medicine

## 2019-11-11 ENCOUNTER — Encounter: Payer: Self-pay | Admitting: Internal Medicine

## 2019-11-11 NOTE — Telephone Encounter (Signed)
   [facility] Pre-operative Risk Assessment    Request for surgical clearance:  1. What type of surgery is being performed? Left shoulder arthroscopy with debridement, decompression, rotator cuff repair and probable biceps tenodesis.    2. When is this surgery scheduled? 11/21/19  3. What type of clearance is required (medical clearance vs. Pharmacy clearance to hold med vs. Both)? both  4. Are there any medications that need to be held prior to surgery and how long? Warfarin instructions   5. Practice name and name of physician performing surgery? [facility], Dr Roland Rack  6. What is your office phone number [phone]   7.   What is your office fax number [phone]  8.   Anesthesia type (None, local, MAC, general) ? Not listed    Ace Gins 11/11/2019, 4:50 PM  _________________________________________________________________   (provider comments below)

## 2019-11-11 NOTE — Telephone Encounter (Signed)
Patient calling in regarding an upcoming shoulder surgery. Patient has been advised to have orthopaedists send a clearance form.

## 2019-11-11 NOTE — Telephone Encounter (Signed)
error 

## 2019-11-11 NOTE — Telephone Encounter (Signed)
Clinical pharmacist to review. Coumadin for atrial flutter. Managed by our [facility] coumadin clinic

## 2019-11-11 NOTE — Telephone Encounter (Signed)
Patient calling to check on status and requesting to speak with [facility] Informed patient that we have received the clearance request and it has been sent to preop to review

## 2019-11-12 ENCOUNTER — Other Ambulatory Visit: Payer: Self-pay | Admitting: Surgery

## 2019-11-12 NOTE — Telephone Encounter (Signed)
Attempted to call the patient.  She answered, but was on the phone with another doctor's office. I advised I would call her back- she asked that I call her back in ~ 30 minutes.

## 2019-11-12 NOTE — Telephone Encounter (Signed)
I called and spoke with the patient. She was calling about her cardiac clearance being taken care of. However, Almyra Deforest, [place] had just called her this morning and her clearance has been taken care of. She was appreciative for the call back.

## 2019-11-12 NOTE — Telephone Encounter (Signed)
   Primary Cardiologist: Deborah Axe, MD  Chart reviewed as part of pre-operative protocol coverage. Patient was contacted 11/12/2019 in reference to pre-operative risk assessment for pending surgery as outlined below.  Deborah Obrien was last seen on 10/03/2019 by [name].  Since that day, Deborah Obrien has done well without any chest pain or shortness of breath. Her functional ability is limited by severe back pain. She stays in wheelchair most of the time.  Therefore, based on ACC/AHA guidelines, the patient would be at acceptable risk for the planned procedure without further cardiovascular testing.   I will route this recommendation to the requesting party via Epic fax function and remove from pre-op pool.  Please call with questions. She has coumadin clinic visit on 3/3 at which time, lovenox bridge will be arranged.   [place], [place] 11/12/2019, 9:26 AM

## 2019-11-12 NOTE — Telephone Encounter (Signed)
Patient with diagnosis of afib on coumadin for anticoagulation.    Procedure: Left shoulder arthroscopy with debridement, decompression, rotator cuff repair and probable biceps tenodesis.   Date of procedure: 11/21/19  CHADS2-VASc score of  9 (CHF, HTN, AGE, DM2, stroke/tia x 2, CAD, AGE, female)  CrCl 27 ml/min Platelet count 247  Per office protocol, patient can hold warfarin for 5 days prior to procedure.   Patient WILL need bridging with Lovenox (enoxaparin) around procedure.  Patient has appointment in coumadin clinic tomorrow and will arrange bridge.

## 2019-11-13 ENCOUNTER — Other Ambulatory Visit: Payer: Self-pay

## 2019-11-13 ENCOUNTER — Ambulatory Visit (INDEPENDENT_AMBULATORY_CARE_PROVIDER_SITE_OTHER): Payer: Medicare Other

## 2019-11-13 DIAGNOSIS — Z5181 Encounter for therapeutic drug level monitoring: Secondary | ICD-10-CM | POA: Diagnosis not present

## 2019-11-13 DIAGNOSIS — I4891 Unspecified atrial fibrillation: Secondary | ICD-10-CM | POA: Diagnosis not present

## 2019-11-13 DIAGNOSIS — I480 Paroxysmal atrial fibrillation: Secondary | ICD-10-CM | POA: Diagnosis not present

## 2019-11-13 DIAGNOSIS — Z8673 Personal history of transient ischemic attack (TIA), and cerebral infarction without residual deficits: Secondary | ICD-10-CM | POA: Diagnosis not present

## 2019-11-13 LAB — POCT INR: INR: 4.3 — AB (ref 2.0–3.0)

## 2019-11-13 MED ORDER — ENOXAPARIN SODIUM 100 MG/ML ~~LOC~~ SOLN: 100.0000 mg | Freq: Two times a day (BID) | SUBCUTANEOUS | 1 refills | Status: DC

## 2019-11-13 NOTE — Patient Instructions (Addendum)
-   Skip warfarin today & tomorrow  Friday, March 5: Last dose of warfarin- take 1/2 tab  Saturday, March 6: No warfarin or Lovenox.  Sunday, March 7: Inject Lovenox 100 mg in the fatty abdominal tissue at least 2 inches from the belly button once a day about 12 hours apart, 8am rotate sites. No warfarin.  Monday, March 8: Inject Lovenox in the fatty tissue, 8am. No warfarin.  Tuesday, March 9: Inject Lovenox in the fatty tissue 8am. No warfarin.  Wednesday, March 10: Inject Lovenox in the fatty tissue in the morning at 8 am. No warfarin.  Thursday, March 11: Procedure Day - No Lovenox - Resume warfarin in the evening or as directed by doctor (take an extra half tablet with usual dose for 2 days then resume normal dose).  Friday, March 12: Resume Lovenox inject in the fatty tissue every 24 hours and take warfarin with extra 1/2 tablet.  Saturday, March 13: Inject Lovenox in the fatty tissue every 24 hours and take warfarin.  Sunday, March 14: Inject Lovenox in the fatty tissue every 24 hours and take warfarin.  Monday, March 15: Inject Lovenox in the fatty tissue every 24 hours and take warfarin.  Tuesday, March 16: Inject Lovenox in the fatty tissue every 24 hours and take warfarin.  Wednesday, March 17: Coumadin appt to check INR.

## 2019-11-14 ENCOUNTER — Encounter
Admission: RE | Admit: 2019-11-14 | Discharge: 2019-11-14 | Disposition: A | Discharge status: Home / Self Care | Payer: Medicare Other | Source: Ambulatory Visit | Attending: Surgery | Admitting: Surgery

## 2019-11-14 ENCOUNTER — Other Ambulatory Visit: Payer: Self-pay

## 2019-11-14 DIAGNOSIS — Z01812 Encounter for preprocedural laboratory examination: Secondary | ICD-10-CM | POA: Insufficient documentation

## 2019-11-14 HISTORY — DX: Depression, unspecified: F32.A

## 2019-11-14 HISTORY — DX: Hypothyroidism, unspecified: E03.9

## 2019-11-14 HISTORY — DX: Disorder of arteries and arterioles, unspecified: I77.9

## 2019-11-14 HISTORY — DX: Myoneural disorder, unspecified: G70.9

## 2019-11-14 HISTORY — DX: Atherosclerotic heart disease of native coronary artery without angina pectoris: I25.10

## 2019-11-14 HISTORY — DX: Cardiac arrhythmia, unspecified: I49.9

## 2019-11-14 HISTORY — DX: Anemia, unspecified: D64.9

## 2019-11-14 HISTORY — DX: Low back pain, unspecified: M54.50

## 2019-11-14 HISTORY — DX: Polyp of stomach and duodenum: K31.7

## 2019-11-14 HISTORY — DX: Cerebral infarction, unspecified: I63.9

## 2019-11-14 NOTE — Patient Instructions (Signed)
INSTRUCTIONS FOR SURGERY     Your surgery is scheduled for:   Thursday, MARCH 11TH     To find out your arrival time for the day of surgery,          please call [phone] between 1 pm and 3 pm on :  Wednesday, MARCH 1OTH     When you arrive for surgery, report to the [facility].       Do NOT stop on the first floor to register.    REMEMBER: Instructions that are not followed completely may result in serious medical risk,  up to and including death, or upon the discretion of your surgeon and anesthesiologist,            your surgery may need to be rescheduled.  __X__ 1. Do not eat food after midnight the night before your procedure.                    No gum, candy, lozenger, tic tacs, tums or hard candies.                  ABSOLUTELY NOTHING SOLID IN YOUR MOUTH AFTER MIDNIGHT                    You may drink unlimited clear liquids up to 2 hours before you are scheduled to arrive for surgery.                   Do not drink anything within those 2 hours unless you need to take medicine, then take the                   smallest amount you need.  Clear liquids include:  water, apple juice without pulp,                   any flavor Gatorade, Black coffee, black tea.  Sugar may be added but no dairy/ honey /lemon.                        Broth and jello is not considered a clear liquid.  __x__  2. On the morning of surgery, please brush your teeth with toothpaste and water. You may rinse with                  mouthwash if you wish but DO NOT SWALLOW TOOTHPASTE OR MOUTHWASH  __X___3. NO alcohol for 24 hours before or after surgery.  __x___ 4.  Do NOT smoke or use e-cigarettes for 24 HOURS PRIOR TO SURGERY.                      DO NOT Use any chewable tobacco products for at least 6 hours prior to surgery.  __x___ 5. If you start any new medication after this appointment and prior to surgery, please                    Bring it with you on the day of surgery.  ___x__ 6. Notify your doctor if there is any change in  your medical condition, such as fever,                    infection, vomitting, diarrhea or any open sores.  __x___ 7.  USE the CHG SOAP as instructed, the night before surgery and the day of surgery.                   Once you have washed with this soap, do NOT use any of the following: Powders, perfumes                    or lotions. Please do not wear make up, hairpins, clips or nail polish. You MAY NOT wear                    Deodorant. Men may shave their face and neck.  Women need to shave 48 hours prior to surgery.                   DO NOT wear ANY jewelry on the day of surgery. If there are rings that are too tight to                    remove easily, please address this prior to the surgery day. Piercings need to be removed.                                                                     NO METAL ON YOUR BODY.                    Do NOT bring any valuables.  If you came to Pre-Admit testing then you will not need license,                     insurance card or credit card.  If you will be staying overnight, please either leave your things in                     the car or have your family be responsible for these items.                     [facility] IS NOT RESPONSIBLE FOR BELONGINGS OR VALUABLES.  ___X__ 8. DO NOT wear contact lenses on surgery day.  You may not have dentures,                     Hearing aides, contacts or glasses in the operating room. These items can be                    Placed in the Recovery Room to receive immediately after surgery.  __x___ 9. IF YOU ARE SCHEDULED TO GO HOME ON THE SAME DAY, YOU MUST                   Have someone to drive you home and to stay with you  for the first 24 hours.                    Have an arrangement prior to arriving on surgery day.  ___x__ 10. Take the  following medications on the morning of surgery with a sip of water:                               1.  ALLOPURINOL                     2.  AMIODARONE                     3.  BUSPAR/BUSPIRONE                     4.  CYMBALTA/ DULOXETENE                     5.  NEURONTIN                     6.  OXYBUTININ                     7.  PROTONIX (take extra dose the night before surgery)                     8.  SYNTHROID                     9.  SYSTANE EYE DROPS                   10.  NORCO, IF YOU NEED IT  __X___ 11.  Follow any instructions provided to you by your surgeon.                        Such as enema, clear liquid bowel prep                      ## PLEASE DRINK THE CARBOHYDRATE DRINK ON THE MORNING                          OF SURGERY. COMPLETE IT 2 HOURS BEFORE ARRIVAL ##  __X__  12. STOP  COUMADIN // ASPIRIN AS OF:  Thursday, MARCH 4TH                       THIS INCLUDES BC POWDERS / GOODIES POWDER  __x___ 13. STOP Anti-inflammatories as of: Thursday, MARCH 4TH                      This includes IBUPROFEN / MOTRIN / ADVIL / ALEVE/ NAPROXYN                    YOU MAY TAKE TYLENOL ANY TIME PRIOR TO SURGERY.  __X___ 5.  Stop supplements until after surgery.                     This includes:  VITAMIN C                 You may continue taking Vitamin B12 / Vitamin D3 but do not take on the morning of surgery.  __X____16.   TAKE 1/2 OF USUAL INSULIN DOSE ON THE EVENING PRIOR TO SURGERY.                     Do NOT take any diabetes medications on surgery day.  __X____17.  Continue to take the following medications but do not take on the morning of surgery:                       LASIX // VITAMIN B12 // VITAMIN D  __X____18.    Wear clean and comfortable clothing to the hospital.  Please bring a copy of your medical advance directives so we can make a copy for your   Hospital record. PLEASE PLEASE PLEASE wear your hearing aides to the hospital so you can hear everyone   Speak to you!!

## 2019-11-19 ENCOUNTER — Other Ambulatory Visit
Admission: RE | Admit: 2019-11-19 | Discharge: 2019-11-19 | Disposition: A | Discharge status: Home / Self Care | Payer: Medicare Other | Source: Ambulatory Visit | Attending: Surgery | Admitting: Surgery

## 2019-11-19 DIAGNOSIS — Z20822 Contact with and (suspected) exposure to covid-19: Secondary | ICD-10-CM | POA: Diagnosis not present

## 2019-11-19 DIAGNOSIS — Z01812 Encounter for preprocedural laboratory examination: Secondary | ICD-10-CM | POA: Insufficient documentation

## 2019-11-19 LAB — SARS CORONAVIRUS 2 (TAT 6-24 HRS): SARS Coronavirus 2: NEGATIVE

## 2019-11-19 NOTE — Pre-Procedure Instructions (Signed)
Notified Device clinic of need for Pacemaker form.  Will message nurse to fax the form back to [place].

## 2019-11-21 ENCOUNTER — Ambulatory Visit: Payer: Medicare Other

## 2019-11-21 ENCOUNTER — Encounter: Payer: Self-pay | Admitting: Surgery

## 2019-11-21 ENCOUNTER — Other Ambulatory Visit: Payer: Self-pay

## 2019-11-21 ENCOUNTER — Encounter
Admission: RE | Disposition: A | Discharge status: Home / Self Care | Payer: Self-pay | Source: Home / Self Care | Attending: Surgery

## 2019-11-21 ENCOUNTER — Ambulatory Visit: Payer: Medicare Other | Admitting: Registered Nurse

## 2019-11-21 ENCOUNTER — Ambulatory Visit
Admission: RE | Admit: 2019-11-21 | Discharge: 2019-11-21 | Disposition: A | Discharge status: Home / Self Care | Payer: Medicare Other | Attending: Surgery | Admitting: Surgery

## 2019-11-21 DIAGNOSIS — M75122 Complete rotator cuff tear or rupture of left shoulder, not specified as traumatic: Secondary | ICD-10-CM | POA: Diagnosis present

## 2019-11-21 DIAGNOSIS — N3281 Overactive bladder: Secondary | ICD-10-CM | POA: Diagnosis not present

## 2019-11-21 DIAGNOSIS — G8929 Other chronic pain: Secondary | ICD-10-CM | POA: Diagnosis not present

## 2019-11-21 DIAGNOSIS — I480 Paroxysmal atrial fibrillation: Secondary | ICD-10-CM | POA: Insufficient documentation

## 2019-11-21 DIAGNOSIS — K219 Gastro-esophageal reflux disease without esophagitis: Secondary | ICD-10-CM | POA: Diagnosis not present

## 2019-11-21 DIAGNOSIS — Z79891 Long term (current) use of opiate analgesic: Secondary | ICD-10-CM | POA: Diagnosis not present

## 2019-11-21 DIAGNOSIS — E1122 Type 2 diabetes mellitus with diabetic chronic kidney disease: Secondary | ICD-10-CM | POA: Insufficient documentation

## 2019-11-21 DIAGNOSIS — I130 Hypertensive heart and chronic kidney disease with heart failure and stage 1 through stage 4 chronic kidney disease, or unspecified chronic kidney disease: Secondary | ICD-10-CM | POA: Insufficient documentation

## 2019-11-21 DIAGNOSIS — I2510 Atherosclerotic heart disease of native coronary artery without angina pectoris: Secondary | ICD-10-CM | POA: Diagnosis not present

## 2019-11-21 DIAGNOSIS — E7800 Pure hypercholesterolemia, unspecified: Secondary | ICD-10-CM | POA: Diagnosis not present

## 2019-11-21 DIAGNOSIS — Z9981 Dependence on supplemental oxygen: Secondary | ICD-10-CM | POA: Diagnosis not present

## 2019-11-21 DIAGNOSIS — M19012 Primary osteoarthritis, left shoulder: Secondary | ICD-10-CM | POA: Diagnosis not present

## 2019-11-21 DIAGNOSIS — Z8262 Family history of osteoporosis: Secondary | ICD-10-CM | POA: Insufficient documentation

## 2019-11-21 DIAGNOSIS — M549 Dorsalgia, unspecified: Secondary | ICD-10-CM | POA: Diagnosis not present

## 2019-11-21 DIAGNOSIS — Z419 Encounter for procedure for purposes other than remedying health state, unspecified: Secondary | ICD-10-CM

## 2019-11-21 DIAGNOSIS — Z79890 Hormone replacement therapy (postmenopausal): Secondary | ICD-10-CM | POA: Diagnosis not present

## 2019-11-21 DIAGNOSIS — M94212 Chondromalacia, left shoulder: Secondary | ICD-10-CM | POA: Diagnosis not present

## 2019-11-21 DIAGNOSIS — Z794 Long term (current) use of insulin: Secondary | ICD-10-CM | POA: Insufficient documentation

## 2019-11-21 DIAGNOSIS — F419 Anxiety disorder, unspecified: Secondary | ICD-10-CM | POA: Diagnosis not present

## 2019-11-21 DIAGNOSIS — Z79899 Other long term (current) drug therapy: Secondary | ICD-10-CM | POA: Diagnosis not present

## 2019-11-21 DIAGNOSIS — N1831 Chronic kidney disease, stage 3a: Secondary | ICD-10-CM | POA: Diagnosis not present

## 2019-11-21 DIAGNOSIS — Z8673 Personal history of transient ischemic attack (TIA), and cerebral infarction without residual deficits: Secondary | ICD-10-CM | POA: Diagnosis not present

## 2019-11-21 DIAGNOSIS — Z7901 Long term (current) use of anticoagulants: Secondary | ICD-10-CM | POA: Diagnosis not present

## 2019-11-21 DIAGNOSIS — I509 Heart failure, unspecified: Secondary | ICD-10-CM | POA: Insufficient documentation

## 2019-11-21 DIAGNOSIS — F329 Major depressive disorder, single episode, unspecified: Secondary | ICD-10-CM | POA: Diagnosis not present

## 2019-11-21 DIAGNOSIS — Z8249 Family history of ischemic heart disease and other diseases of the circulatory system: Secondary | ICD-10-CM | POA: Insufficient documentation

## 2019-11-21 DIAGNOSIS — Z833 Family history of diabetes mellitus: Secondary | ICD-10-CM | POA: Insufficient documentation

## 2019-11-21 DIAGNOSIS — M109 Gout, unspecified: Secondary | ICD-10-CM | POA: Insufficient documentation

## 2019-11-21 DIAGNOSIS — M810 Age-related osteoporosis without current pathological fracture: Secondary | ICD-10-CM | POA: Insufficient documentation

## 2019-11-21 DIAGNOSIS — Z96652 Presence of left artificial knee joint: Secondary | ICD-10-CM | POA: Insufficient documentation

## 2019-11-21 HISTORY — PX: SHOULDER ARTHROSCOPY WITH ROTATOR CUFF REPAIR AND OPEN BICEPS TENODESIS: SHX6677

## 2019-11-21 LAB — GLUCOSE, CAPILLARY
Glucose-Capillary: 116 mg/dL — ABNORMAL HIGH (ref 70–99)
Glucose-Capillary: 164 mg/dL — ABNORMAL HIGH (ref 70–99)
Glucose-Capillary: 161 mg/dL — ABNORMAL HIGH (ref 70–99)

## 2019-11-21 LAB — PROTIME-INR
INR: 0.9 (ref 0.8–1.2)
Prothrombin Time: 11.8 seconds (ref 11.4–15.2)

## 2019-11-21 LAB — APTT: aPTT: 31 seconds (ref 24–36)

## 2019-11-21 SURGERY — SHOULDER ARTHROSCOPY WITH ROTATOR CUFF REPAIR AND OPEN BICEPS TENODESIS
Anesthesia: General | Site: Shoulder | Laterality: Left

## 2019-11-21 MED ORDER — LIDOCAINE HCL (PF) 1 % IJ SOLN
INTRAMUSCULAR | Status: AC
  Filled 2019-11-21: qty 5

## 2019-11-21 MED ORDER — ONDANSETRON HCL 4 MG/2ML IJ SOLN
INTRAMUSCULAR | Status: DC | PRN
  Administered 2019-11-21: 4 mg via INTRAVENOUS

## 2019-11-21 MED ORDER — ONDANSETRON HCL 4 MG/2ML IJ SOLN: 4.0000 mg | Freq: Four times a day (QID) | INTRAMUSCULAR | Status: DC | PRN

## 2019-11-21 MED ORDER — LIDOCAINE HCL (CARDIAC) PF 100 MG/5ML IV SOSY
PREFILLED_SYRINGE | INTRAVENOUS | Status: DC | PRN
  Administered 2019-11-21: 80 mg via INTRAVENOUS

## 2019-11-21 MED ORDER — POTASSIUM CHLORIDE IN NACL 20-0.9 MEQ/L-% IV SOLN: INTRAVENOUS | Status: DC

## 2019-11-21 MED ORDER — FENTANYL CITRATE (PF) 100 MCG/2ML IJ SOLN: 50.0000 ug | INTRAMUSCULAR | Status: DC | PRN

## 2019-11-21 MED ORDER — HYDROCODONE-ACETAMINOPHEN 7.5-325 MG PO TABS: 1.0000 | ORAL_TABLET | ORAL | Status: DC | PRN

## 2019-11-21 MED ORDER — MIDAZOLAM HCL 2 MG/2ML IJ SOLN
INTRAMUSCULAR | Status: AC
  Filled 2019-11-21: qty 2

## 2019-11-21 MED ORDER — SODIUM CHLORIDE 0.9 % IV SOLN: INTRAVENOUS | Status: DC

## 2019-11-21 MED ORDER — CHLORHEXIDINE GLUCONATE 4 % EX LIQD
60.0000 mL | Freq: Once | CUTANEOUS | Status: AC
  Administered 2019-11-21: 4 via TOPICAL

## 2019-11-21 MED ORDER — ONDANSETRON HCL 4 MG PO TABS: 4.0000 mg | ORAL_TABLET | Freq: Four times a day (QID) | ORAL | Status: DC | PRN

## 2019-11-21 MED ORDER — HYDROCODONE-ACETAMINOPHEN 10-325 MG PO TABS: 1.0000 | ORAL_TABLET | ORAL | 0 refills | Status: DC | PRN

## 2019-11-21 MED ORDER — BUPIVACAINE HCL (PF) 0.5 % IJ SOLN
INTRAMUSCULAR | Status: AC
  Filled 2019-11-21: qty 10

## 2019-11-21 MED ORDER — FENTANYL CITRATE (PF) 100 MCG/2ML IJ SOLN
INTRAMUSCULAR | Status: AC
  Administered 2019-11-21: 50 ug via INTRAVENOUS
  Filled 2019-11-21: qty 2

## 2019-11-21 MED ORDER — PHENYLEPHRINE HCL (PRESSORS) 10 MG/ML IV SOLN
INTRAVENOUS | Status: DC | PRN
  Administered 2019-11-21 (×2): 200 ug via INTRAVENOUS

## 2019-11-21 MED ORDER — MIDAZOLAM HCL 2 MG/2ML IJ SOLN: 1.0000 mg | INTRAMUSCULAR | Status: DC | PRN

## 2019-11-21 MED ORDER — PROPOFOL 10 MG/ML IV BOLUS
INTRAVENOUS | Status: AC
  Filled 2019-11-21: qty 20

## 2019-11-21 MED ORDER — ROCURONIUM BROMIDE 100 MG/10ML IV SOLN
INTRAVENOUS | Status: DC | PRN
  Administered 2019-11-21: 50 mg via INTRAVENOUS

## 2019-11-21 MED ORDER — MIDAZOLAM HCL 2 MG/2ML IJ SOLN
INTRAMUSCULAR | Status: AC
  Administered 2019-11-21: 10:00:00 1 mg via INTRAVENOUS
  Filled 2019-11-21: qty 2

## 2019-11-21 MED ORDER — EPHEDRINE SULFATE 50 MG/ML IJ SOLN
INTRAMUSCULAR | Status: DC | PRN
  Administered 2019-11-21 (×3): 10 mg via INTRAVENOUS

## 2019-11-21 MED ORDER — FENTANYL CITRATE (PF) 100 MCG/2ML IJ SOLN
INTRAMUSCULAR | Status: DC | PRN
  Administered 2019-11-21: 50 ug via INTRAVENOUS

## 2019-11-21 MED ORDER — METOCLOPRAMIDE HCL 5 MG/ML IJ SOLN: 5.0000 mg | Freq: Three times a day (TID) | INTRAMUSCULAR | Status: DC | PRN

## 2019-11-21 MED ORDER — FENTANYL CITRATE (PF) 100 MCG/2ML IJ SOLN
25.0000 ug | INTRAMUSCULAR | Status: DC | PRN
  Administered 2019-11-21 (×3): 25 ug via INTRAVENOUS

## 2019-11-21 MED ORDER — SODIUM CHLORIDE 0.9 % IV SOLN
INTRAVENOUS | Status: DC | PRN
  Administered 2019-11-21: 12:00:00 15 ug/min via INTRAVENOUS

## 2019-11-21 MED ORDER — ONDANSETRON HCL 4 MG/2ML IJ SOLN
INTRAMUSCULAR | Status: AC
  Filled 2019-11-21: qty 2

## 2019-11-21 MED ORDER — CLINDAMYCIN PHOSPHATE 900 MG/50ML IV SOLN
INTRAVENOUS | Status: AC
  Filled 2019-11-21: qty 50

## 2019-11-21 MED ORDER — BUPIVACAINE-EPINEPHRINE 0.5% -1:200000 IJ SOLN
INTRAMUSCULAR | Status: DC | PRN
  Administered 2019-11-21: 30 mL

## 2019-11-21 MED ORDER — FENTANYL CITRATE (PF) 100 MCG/2ML IJ SOLN
INTRAMUSCULAR | Status: AC
  Filled 2019-11-21: qty 2

## 2019-11-21 MED ORDER — CLINDAMYCIN PHOSPHATE 900 MG/50ML IV SOLN
900.0000 mg | INTRAVENOUS | Status: AC
  Administered 2019-11-21: 900 mg via INTRAVENOUS

## 2019-11-21 MED ORDER — PROPOFOL 10 MG/ML IV BOLUS
INTRAVENOUS | Status: DC | PRN
  Administered 2019-11-21: 130 mg via INTRAVENOUS

## 2019-11-21 MED ORDER — ONDANSETRON HCL 4 MG/2ML IJ SOLN: 4.0000 mg | Freq: Once | INTRAMUSCULAR | Status: DC | PRN

## 2019-11-21 MED ORDER — LACTATED RINGERS IV SOLN
INTRAVENOUS | Status: DC | PRN
  Administered 2019-11-21: 1 mL

## 2019-11-21 MED ORDER — METOCLOPRAMIDE HCL 10 MG PO TABS: 5.0000 mg | ORAL_TABLET | Freq: Three times a day (TID) | ORAL | Status: DC | PRN

## 2019-11-21 MED ORDER — BUPIVACAINE LIPOSOME 1.3 % IJ SUSP
INTRAMUSCULAR | Status: AC
  Filled 2019-11-21: qty 20

## 2019-11-21 MED ORDER — MIDAZOLAM HCL 2 MG/2ML IJ SOLN
INTRAMUSCULAR | Status: DC | PRN
  Administered 2019-11-21: 1 mg via INTRAVENOUS

## 2019-11-21 MED ORDER — SUGAMMADEX SODIUM 200 MG/2ML IV SOLN
INTRAVENOUS | Status: DC | PRN
  Administered 2019-11-21: 200 mg via INTRAVENOUS

## 2019-11-21 MED ORDER — ROCURONIUM BROMIDE 10 MG/ML (PF) SYRINGE
PREFILLED_SYRINGE | INTRAVENOUS | Status: AC
  Filled 2019-11-21: qty 10

## 2019-11-21 SURGICAL SUPPLY — 55 items
ANCHOR BONE REGENETEN (Anchor) ×1 IMPLANT
ANCHOR JUGGERKNOT WTAP NDL 2.9 (Anchor) ×1 IMPLANT
ANCHOR SUT W/ ORTHOCORD (Anchor) ×1 IMPLANT
ANCHOR TENDON REGENETEN (Staple) ×1 IMPLANT
BIT DRILL JUGRKNT W/NDL BIT2.9 (DRILL) IMPLANT
BLADE FULL RADIUS 3.5 (BLADE) ×2 IMPLANT
BUR ACROMIONIZER 4.0 (BURR) ×2 IMPLANT
CANNULA SHAVER 8MMX76MM (CANNULA) ×2 IMPLANT
CHLORAPREP W/TINT 26 (MISCELLANEOUS) ×2 IMPLANT
COVER MAYO STAND REUSABLE (DRAPES) ×3 IMPLANT
COVER WAND RF STERILE (DRAPES) ×2 IMPLANT
DRAPE IMP U-DRAPE 54X76 (DRAPES) ×4 IMPLANT
DRILL JUGGERKNOT W/NDL BIT 2.9 (DRILL)
DRSG OPSITE POSTOP 3X4 (GAUZE/BANDAGES/DRESSINGS) ×2 IMPLANT
DRSG OPSITE POSTOP 4X6 (GAUZE/BANDAGES/DRESSINGS) ×1 IMPLANT
ELECT CAUTERY BLADE TIP 2.5 (TIP) ×2
ELECT REM PT RETURN 9FT ADLT (ELECTROSURGICAL) ×2
ELECTRODE CAUTERY BLDE TIP 2.5 (TIP) ×1 IMPLANT
ELECTRODE REM PT RTRN 9FT ADLT (ELECTROSURGICAL) ×1 IMPLANT
GAUZE SPONGE 4X4 12PLY STRL (GAUZE/BANDAGES/DRESSINGS) ×1 IMPLANT
GAUZE XEROFORM 1X8 LF (GAUZE/BANDAGES/DRESSINGS) ×2 IMPLANT
GLOVE BIO SURGEON STRL SZ7.5 (GLOVE) ×4 IMPLANT
GLOVE BIO SURGEON STRL SZ8 (GLOVE) ×4 IMPLANT
GLOVE BIOGEL PI IND STRL 8 (GLOVE) ×1 IMPLANT
GLOVE BIOGEL PI INDICATOR 8 (GLOVE) ×1
GLOVE INDICATOR 8.0 STRL GRN (GLOVE) ×2 IMPLANT
GOWN STRL REUS W/ TWL LRG LVL3 (GOWN DISPOSABLE) ×1 IMPLANT
GOWN STRL REUS W/ TWL XL LVL3 (GOWN DISPOSABLE) ×1 IMPLANT
GOWN STRL REUS W/TWL LRG LVL3 (GOWN DISPOSABLE) ×1
GOWN STRL REUS W/TWL XL LVL3 (GOWN DISPOSABLE) ×1
GRASPER SUT 15 45D LOW PRO (SUTURE) ×1 IMPLANT
IMPL REGENETEN MEDIUM (Shoulder) IMPLANT
IMPLANT REGENETEN MEDIUM (Shoulder) ×2 IMPLANT
IV LACTATED RINGER IRRG 3000ML (IV SOLUTION) ×1
IV LR IRRIG 3000ML ARTHROMATIC (IV SOLUTION) ×2 IMPLANT
KIT CANNULA 8X76-LX IN CANNULA (CANNULA) IMPLANT
MANIFOLD NEPTUNE II (INSTRUMENTS) ×2 IMPLANT
MASK FACE SPIDER DISP (MASK) ×2 IMPLANT
MAT ABSORB  FLUID 56X50 GRAY (MISCELLANEOUS) ×1
MAT ABSORB FLUID 56X50 GRAY (MISCELLANEOUS) ×1 IMPLANT
PACK ARTHROSCOPY SHOULDER (MISCELLANEOUS) ×2 IMPLANT
PASSER SUT FIRSTPASS SELF (INSTRUMENTS) IMPLANT
SLING ARM LRG DEEP (SOFTGOODS) ×1 IMPLANT
SLING ULTRA II LG (MISCELLANEOUS) ×2 IMPLANT
STAPLER SKIN PROX 35W (STAPLE) ×2 IMPLANT
STOCKINETTE IMPERVIOUS 9X36 MD (GAUZE/BANDAGES/DRESSINGS) ×1 IMPLANT
STRAP SAFETY 5IN WIDE (MISCELLANEOUS) ×2 IMPLANT
SUT ETHIBOND 0 MO6 C/R (SUTURE) ×2 IMPLANT
SUT ULTRABRAID 2 COBRAID 38 (SUTURE) IMPLANT
SUT VIC AB 2-0 CT1 27 (SUTURE) ×2
SUT VIC AB 2-0 CT1 TAPERPNT 27 (SUTURE) ×2 IMPLANT
TAPE MICROFOAM 4IN (TAPE) ×1 IMPLANT
TUBING ARTHRO INFLOW-ONLY STRL (TUBING) ×2 IMPLANT
TUBING CONNECTING 10 (TUBING) ×2 IMPLANT
WAND WEREWOLF FLOW 90D (MISCELLANEOUS) ×2 IMPLANT

## 2019-11-21 NOTE — Anesthesia Postprocedure Evaluation (Signed)
Anesthesia Post Note  Patient: Deborah Obrien  Procedure(s) Performed: LEFT SHOULDER ARTHROSCOPY WITH DEBRIDEMENT, DECOMPRESSION, ROTATOR CUFF REPAIR AND BICEPS TENOLYSIS (Left Shoulder)  Patient location during evaluation: PACU Anesthesia Type: General Level of consciousness: awake and alert Pain management: pain level controlled Vital Signs Assessment: post-procedure vital signs reviewed and stable Respiratory status: spontaneous breathing, nonlabored ventilation, respiratory function stable and patient connected to nasal cannula oxygen Cardiovascular status: blood pressure returned to baseline and stable Postop Assessment: no apparent nausea or vomiting Anesthetic complications: no     Last Vitals:  Vitals:   11/21/19 1411 11/21/19 1430  BP: 111/77 (!) 115/51  Pulse: 61 63  Resp: (!) 21 20  Temp:    SpO2: 94% 96%    Last Pain:  Vitals:   11/21/19 1430  TempSrc:   PainSc: 1                  Martha Clan

## 2019-11-21 NOTE — Transfer of Care (Signed)
Immediate Anesthesia Transfer of Care Note  Patient: Deborah Obrien  Procedure(s) Performed: Procedure(s): LEFT SHOULDER ARTHROSCOPY WITH DEBRIDEMENT, DECOMPRESSION, ROTATOR CUFF REPAIR AND BICEPS TENOLYSIS (Left)  Patient Location: PACU  Anesthesia Type:General  Level of Consciousness: sedated  Airway & Oxygen Therapy: Patient Spontanous Breathing and Patient connected to face mask oxygen  Post-op Assessment: Report given to RN and Post -op Vital signs reviewed and stable  Post vital signs: Reviewed and stable  Last Vitals:  Vitals:   11/21/19 1043 11/21/19 1256  BP: (!) 130/57 (!) 110/46  Pulse: 62 65  Resp:  20  Temp:  (!) 36 C  SpO2: 242% 683%    Complications: No apparent anesthesia complications

## 2019-11-21 NOTE — Discharge Instructions (Addendum)
Orthopedic discharge instructions: Keep dressing dry and intact.  May shower after dressing changed on post-op day #4 (Monday).  Cover staples with Band-Aids after drying off. Apply ice frequently to shoulder. Take hydrocodone as prescribed when needed.  May supplement with ES Tylenol if necessary. Keep shoulder immobilizer on at all times except may remove for bathing purposes. Follow-up in 10-14 days or as scheduled.  AMBULATORY SURGERY  DISCHARGE INSTRUCTIONS   1) The drugs that you were given will stay in your system until tomorrow so for the next 24 hours you should not:  A) Drive an automobile B) Make any legal decisions C) Drink any alcoholic beverage   2) You may resume regular meals tomorrow.  Today it is better to start with liquids and gradually work up to solid foods.  You may eat anything you prefer, but it is better to start with liquids, then soup and crackers, and gradually work up to solid foods.   3) Please notify your doctor immediately if you have any unusual bleeding, trouble breathing, redness and pain at the surgery site, drainage, fever, or pain not relieved by medication.    4) Additional Instructions:        Please contact your physician with any problems or Same Day Surgery at [phone], Monday through Friday 6 am to 4 pm, or [place] at [facility] number at [phone].

## 2019-11-21 NOTE — Op Note (Signed)
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  Procedure:   Extensive arthroscopic debridement, arthroscopic subscapularis tendon repair, arthroscopic subacromial decompression, biceps tenolysis, and mini-open repair of recurrent supraspinatus tear augmented with a [employer] Regeneten patch, left shoulder.  Anesthesia:   General endotracheal with interscalene block using Exparel placed preoperatively by the anesthesiologist.  Surgeon:   Pascal Lux, MD  Assistant:   Cameron Proud, PA-C  Findings:   As above. There was moderate fraying of the labrum anteriorly, superiorly, and posterior superiorly without frank detachment from the glenoid. There was a recurrent tear involving the midinsertional fibers of the supraspinatus tendon which measured approximately 1.5 cm in diameter. There also was a near full-thickness tear involving the superior insertional fibers of the subscapularis tendon. There were diffuse grade 2 chondromalacial changes involving the glenoid with a focal area of grade 3 chondromalacial changes centrally. The humerus also demonstrated grade 2 chondromalacial changes with a focal area of grade III chondromalacia centrally.  Complications:   None  Fluids:   600 cc  Estimated blood loss:   5 cc  Tourniquet time:   None  Drains:   None  Closure:   Staples      Brief clinical note:   The patient is a 79 year old female who is now 10+ years status post a mini-open rotator cuff repair by Dr. Marry Guan. The patient did well until about two years ago when she began to complain of recurrent left shoulder pain without antecedent injury. The patient's symptoms have progressed despite  medications, activity modification, etc. The patient's history and examination are consistent with impingement/tendinopathy with a rotator cuff tear. These findings were confirmed by MRI scan. The patient presents at this time for definitive management of these shoulder symptoms.  Procedure:   The patient underwent placement of an interscalene block using Exparel by the anesthesiologist in the preoperative holding area before being brought into the operating room and lain in the supine position. The patient then underwent general endotracheal intubation and anesthesia before being repositioned in the beach chair position using the beach chair positioner. The left shoulder and upper extremity were prepped with ChloraPrep solution before being draped sterilely. Preoperative antibiotics were administered. A timeout was performed to confirm the proper surgical site before the expected portal sites and incision site were injected with 0.5% Sensorcaine with epinephrine.   A posterior portal was created and the glenohumeral joint thoroughly inspected with the findings as described above. An anterior portal was created using an outside-in technique. The labrum and rotator cuff were further probed, again confirming the above-noted findings.  Areas of labral fraying were debrided back to stable margins using the full-radius resector, as were areas of synovitis. The torn margins of the subscapularis and supraspinatus tendon tears also were debrided back to stable margins using the full-radius resector. Finally, the areas of grade 3 chondromalacial changes were debrided back to stable margins using the full-radius resector. The ArthroCare wand was inserted and used to release the biceps tendon from its labral anchor. It also was used to obtain hemostasis as well as to "anneal" the labrum superiorly and anteriorly.   A separate superolateral portal site was created using an outside in technique. This portal served as a  working portal to  to facilitate an arthroscopic repair of the subscapularis tendon tear. This was performed using a single Mitek BioKnotless anchor. The adequacy of repair was assessed both by probing as well as with passive external rotation and noted to be stable. The instruments were removed from the joint after suctioning the excess fluid.  The camera was repositioned through the posterior portal into the subacromial space. A separate lateral portal was created using an outside-in technique. The 3.5 mm full-radius resector was introduced and used to perform a subtotal bursectomy. The ArthroCare wand was then inserted and used to remove the periosteal tissue off the undersurface of the anterior third of the acromion as well as to recess the coracoacromial ligament from its attachment along the anterior and lateral margins of the acromion. The 4.0 mm acromionizing bur was introduced and used to complete the decompression by removing the undersurface of the anterior third of the acromion. The full radius resector was reintroduced to remove any residual bony debris before the ArthroCare wand was reintroduced to obtain hemostasis. The instruments were then removed from the subacromial space after suctioning the excess fluid.  Utilizing the previous incision, an approximately 4-5 cm incision was made over the anterolateral aspect of the shoulder beginning at the anterolateral corner of the acromion and extending distally in line with the bicipital groove. This incision was carried down through the subcutaneous tissues to expose the deltoid fascia. The raphae between the anterior and middle thirds was identified and this plane developed to provide access into the subacromial space. Several retained permanent sutures from her is her previous surgery were identified and removed. Additional bursal tissues were debrided sharply using Metzenbaum scissors. The rotator cuff tear was readily identified. The margins were  debrided sharply with a #15 blade and the exposed greater tuberosity roughened with a rongeur. The tear was repaired using a single Biomet 2.9 mm JuggerKnot anchor. As the repair was primarily a side to side repair, no lateral fixation row was deemed necessary.  However, given her age and the fact that this was a recurrent tear, it was felt best to apply a [place] patch over the repair to augment healing.  The medium patch was selected and secured using the appropriate soft tissue and bone staples. An apparent watertight closure was obtained.  The wound was copiously irrigated with sterile saline solution before the deltoid raphae was reapproximated using 2-0 Vicryl interrupted sutures. The subcutaneous tissues were closed in two layers using 2-0 Vicryl interrupted sutures before the skin was closed using staples. The portal sites also were closed using staples. A sterile bulky dressing was applied to the shoulder before the arm was placed into a shoulder immobilizer. The patient was then awakened, extubated, and returned to the recovery room in satisfactory condition after tolerating the procedure well.

## 2019-11-21 NOTE — H&P (Signed)
Paper H&P to be scanned into permanent record. H&P reviewed and patient re-examined. No changes. 

## 2019-11-21 NOTE — Anesthesia Preprocedure Evaluation (Signed)
Anesthesia Evaluation  Patient identified by MRN, date of birth, ID band Patient awake    Reviewed: Allergy & Precautions, H&P , NPO status , Patient's Chart, lab work & pertinent test results  History of Anesthesia Complications Negative for: history of anesthetic complications  Airway Mallampati: III  TM Distance: >3 FB     Dental  (+) Upper Dentures, Dental Advidsory Given   Pulmonary shortness of breath and with exertion, neg COPD, neg recent URI,           Cardiovascular Exercise Tolerance: Poor hypertension, (-) angina+ CAD, +CHF and + DOE  (-) Past MI and (-) Cardiac Stents + dysrhythmias Atrial Fibrillation + pacemaker (-) Valvular Problems/Murmurs  - Left ventricle: The cavity size was normal. Wall thickness was   normal. Systolic function was normal. The estimated ejection   fraction was in the range of 60% to 65%. - Mitral valve: Moderately thickened, mildly calcified leaflets . - Left atrium: The atrium was mildly dilated. - Right ventricle: The cavity size was mildly to moderately   dilated. Wall thickness was mildly increased. - Right atrium: The atrium was mildly dilated. - Tricuspid valve: There was moderate regurgitation. - Pulmonary arteries: Systolic pressure was moderately increased,   estimated to be 50 mm Hg plus central venous/right atrial   pressure.   Neuro/Psych PSYCHIATRIC DISORDERS Anxiety Depression Chronic back pain on opioid therapy TIA   GI/Hepatic Neg liver ROS, GERD  ,  Endo/Other  diabetesHypothyroidism   Renal/GU CRFRenal disease  negative genitourinary   Musculoskeletal   Abdominal   Peds  Hematology negative hematology ROS (+)   Anesthesia Other Findings Past Medical History: 2016: A-fib ([place]) No date: Acid reflux No date: Anxiety No date: Bowel obstruction ([place]) 2017: CHF (congestive heart failure) (HCC) No date: Chronic kidney disease (CKD) stage G3a/A1, moderately   decreased glomerular filtration rate (GFR) between 45-59 mL/min/1.73  square meter and albuminuria creatinine ratio less than 30 mg/g (HCC) No date: Diabetes mellitus without complication (HCC) No date: Dyspnea No date: Fatty liver No date: GI bleed No date: Gout No date: Hypertension No date: Morbid obesity ([place]) No date: Osteoarthritis No date: Osteoporosis No date: Paroxysmal atrial fibrillation (HCC) No date: Presence of permanent cardiac pacemaker 08/24/2017: Sinus node dysfunction (HCC) No date: TIA (transient ischemic attack)  Past Surgical History: 1980: ABDOMINAL HYSTERECTOMY No date: APPENDECTOMY No date: BACK SURGERY     Comment:  2008 2019: BACK SURGERY     Comment:  patient describes kyphoplasty for compression fractures,              MD referral said fusion 08/16: BREAST BIOPSY; Right     Comment:  stereo fibroadenomatous change, neg for atypia No date: BREAST EXCISIONAL BIOPSY; Left     Comment:  neg 08/25/2016: CARDIAC CATHETERIZATION; Left     Comment:  Procedure: Left Heart Cath and Coronary Angiography;                Surgeon: Corey Skains, MD;  Location: [facility]               CV LAB;  Service: Cardiovascular;  Laterality: Left; 06/28/2017: CARDIOVERSION; N/A     Comment:  Procedure: CARDIOVERSION;  Surgeon: Corey Skains,               MD;  Location: [facility] ORS;  Service: Cardiovascular;                Laterality: N/A; 02/06/2018: CARDIOVERSION; N/A  Comment:  Procedure: CARDIOVERSION;  Surgeon: Josue Hector, MD;              Location: [facility] ENDOSCOPY;  Service: Cardiovascular;                Laterality: N/A; 2012: CATARACT EXTRACTION, BILATERAL No date: CHOLECYSTECTOMY 1999: colon blockage 1999: COLON SURGERY 2016: COLONOSCOPY     Comment:  polyps removed 2016 09/14/2016: DORSAL COMPARTMENT RELEASE; Left     Comment:  Procedure: RELEASE DORSAL COMPARTMENT ([place]);                Surgeon: Dereck Leep, MD;  Location: [facility] ORS;                 Service: Orthopedics;  Laterality: Left; 01/27/2015: ESOPHAGOGASTRODUODENOSCOPY; N/A     Comment:  Procedure: ESOPHAGOGASTRODUODENOSCOPY (EGD);  Surgeon:               Lollie Sails, MD;  Location: [facility] ENDOSCOPY;                Service: Endoscopy;  Laterality: N/A; 07/24/2015: ESOPHAGOGASTRODUODENOSCOPY (EGD) WITH PROPOFOL; N/A     Comment:  Procedure: ESOPHAGOGASTRODUODENOSCOPY (EGD) WITH               PROPOFOL;  Surgeon: Lucilla Lame, MD;  Location: [facility]               ENDOSCOPY;  Service: Endoscopy;  Laterality: N/A; 04/12/2018: ESOPHAGOGASTRODUODENOSCOPY (EGD) WITH PROPOFOL; N/A     Comment:  Procedure: ESOPHAGOGASTRODUODENOSCOPY (EGD) WITH               PROPOFOL;  Surgeon: Jonathon Bellows, MD;  Location: [facility]               ENDOSCOPY;  Service: Gastroenterology;  Laterality: N/A; 06/22/2018: ESOPHAGOGASTRODUODENOSCOPY (EGD) WITH PROPOFOL; N/A     Comment:  Procedure: ESOPHAGOGASTRODUODENOSCOPY (EGD) WITH               PROPOFOL;  Surgeon: [name], MD;  Location: [facility]               ENDOSCOPY;  Service: Endoscopy;  Laterality: N/A; 07/09/2018: ESOPHAGOGASTRODUODENOSCOPY (EGD) WITH PROPOFOL; N/A     Comment:  Procedure: ESOPHAGOGASTRODUODENOSCOPY (EGD) WITH               PROPOFOL with resection of gastric polyps;  Surgeon:               Jonathon Bellows, MD;  Location: [facility] ENDOSCOPY;  Service:               Gastroenterology;  Laterality: N/A; 06/22/2018: EUS; N/A     Comment:  Procedure: UPPER ENDOSCOPIC ULTRASOUND (EUS) RADIAL;                Surgeon: [name], MD;  Location: [facility] ENDOSCOPY;                Service: Endoscopy;  Laterality: N/A; 2014: JOINT REPLACEMENT     Comment:  Bilateral Knee replacement 08/29/2017: LEAD REVISION/REPAIR; N/A     Comment:  Procedure: LEAD REVISION/REPAIR;  Surgeon: Constance Haw, MD;  Location: [place] CV LAB;  Service:               Cardiovascular;  Laterality: N/A; No date: OOPHORECTOMY 07/01/2015:  PACEMAKER INSERTION; Left     Comment:  Procedure: INSERTION PACEMAKER;  Surgeon: Sheppard Coil  Paraschos, MD;  Location: [place] ORS;  Service:               Cardiovascular;  Laterality: Left; 08/28/2017: PACEMAKER REVISION; N/A     Comment:  Procedure: PACEMAKER REVISION;  Surgeon: Deboraha Sprang, MD;  Location: [place] CV LAB;  Service:               Cardiovascular;  Laterality: N/A; No date: REPLACEMENT TOTAL KNEE BILATERAL 2013: SHOULDER ARTHROSCOPY WITH SUBACROMIAL DECOMPRESSION; Left 06/28/2017: TEE WITHOUT CARDIOVERSION; N/A     Comment:  Procedure: TRANSESOPHAGEAL ECHOCARDIOGRAM (TEE);                Surgeon: Corey Skains, MD;  Location: [facility] ORS;                Service: Cardiovascular;  Laterality: N/A;  BMI    Body Mass Index: 37.59 kg/m      Reproductive/Obstetrics negative OB ROS                             Anesthesia Physical  Anesthesia Plan  ASA: III  Anesthesia Plan: General   Post-op Pain Management:    Induction: Intravenous  PONV Risk Score and Plan: Ondansetron, Dexamethasone and Treatment may vary due to age or medical condition  Airway Management Planned: Oral ETT  Additional Equipment:   Intra-op Plan:   Post-operative Plan: Extubation in OR  Informed Consent: I have reviewed the patients History and Physical, chart, labs and discussed the procedure including the risks, benefits and alternatives for the proposed anesthesia with the patient or authorized representative who has indicated his/her understanding and acceptance.     Dental Advisory Given  Plan Discussed with: Anesthesiologist and CRNA  Anesthesia Plan Comments:         Anesthesia Quick Evaluation

## 2019-11-21 NOTE — OR Nursing (Signed)
Pain script for Norco was left behind after discharge. Spoke with patients husband [name], he agreed to pick up script sometime tomorrow. I told him it would be locked up in PACU safe. I left a note for the front desk making them aware of the situation.

## 2019-11-21 NOTE — Anesthesia Procedure Notes (Signed)
Procedure Name: Intubation Date/Time: 11/21/2019 11:08 AM Performed by: Doreen Salvage, CRNA Pre-anesthesia Checklist: Patient identified, Patient being monitored, Timeout performed, Emergency Drugs available and Suction available Patient Re-evaluated:Patient Re-evaluated prior to induction Oxygen Delivery Method: Circle system utilized Preoxygenation: Pre-oxygenation with 100% oxygen Induction Type: IV induction Ventilation: Mask ventilation without difficulty Laryngoscope Size: Mac and 3 Grade View: Grade II Tube type: Oral Tube size: 7.0 mm Number of attempts: 1 Airway Equipment and Method: Stylet Placement Confirmation: ETT inserted through vocal cords under direct vision,  positive ETCO2 and breath sounds checked- equal and bilateral Secured at: 21 cm Tube secured with: Tape Dental Injury: Teeth and Oropharynx as per pre-operative assessment

## 2019-11-26 ENCOUNTER — Ambulatory Visit (INDEPENDENT_AMBULATORY_CARE_PROVIDER_SITE_OTHER): Payer: Medicare Other | Admitting: *Deleted

## 2019-11-26 DIAGNOSIS — I495 Sick sinus syndrome: Secondary | ICD-10-CM | POA: Diagnosis not present

## 2019-11-26 LAB — CUP PACEART REMOTE DEVICE CHECK
Battery Remaining Longevity: 127 mo
Battery Voltage: 2.97 V
Brady Statistic AP VP Percent: 0.04 %
Brady Statistic AP VS Percent: 89.52 %
Brady Statistic AS VP Percent: 0 %
Brady Statistic AS VS Percent: 10.44 %
Brady Statistic RA Percent Paced: 89.58 %
Brady Statistic RV Percent Paced: 0.04 %
Date Time Interrogation Session: 20210316012838
Implantable Lead Implant Date: 20161019
Implantable Lead Implant Date: 20181217
Implantable Lead Location: 753859
Implantable Lead Location: 753860
Implantable Lead Model: 5076
Implantable Lead Model: 5076
Implantable Pulse Generator Implant Date: 20181217
Lead Channel Impedance Value: 285 Ohm
Lead Channel Impedance Value: 342 Ohm
Lead Channel Impedance Value: 380 Ohm
Lead Channel Impedance Value: 399 Ohm
Lead Channel Pacing Threshold Amplitude: 0.75 V
Lead Channel Pacing Threshold Amplitude: 1 V
Lead Channel Pacing Threshold Pulse Width: 0.4 ms
Lead Channel Pacing Threshold Pulse Width: 0.4 ms
Lead Channel Sensing Intrinsic Amplitude: 4.375 mV
Lead Channel Sensing Intrinsic Amplitude: 4.375 mV
Lead Channel Sensing Intrinsic Amplitude: 4.5 mV
Lead Channel Sensing Intrinsic Amplitude: 4.5 mV
Lead Channel Setting Pacing Amplitude: 2 V
Lead Channel Setting Pacing Amplitude: 2.5 V
Lead Channel Setting Pacing Pulse Width: 0.4 ms
Lead Channel Setting Sensing Sensitivity: 1.2 mV

## 2019-11-26 NOTE — Progress Notes (Signed)
PPM Remote  

## 2019-11-27 ENCOUNTER — Other Ambulatory Visit: Payer: Self-pay

## 2019-11-27 ENCOUNTER — Ambulatory Visit (INDEPENDENT_AMBULATORY_CARE_PROVIDER_SITE_OTHER): Payer: Medicare Other

## 2019-11-27 DIAGNOSIS — I480 Paroxysmal atrial fibrillation: Secondary | ICD-10-CM | POA: Diagnosis not present

## 2019-11-27 DIAGNOSIS — Z5181 Encounter for therapeutic drug level monitoring: Secondary | ICD-10-CM

## 2019-11-27 DIAGNOSIS — I4891 Unspecified atrial fibrillation: Secondary | ICD-10-CM

## 2019-11-27 DIAGNOSIS — Z8673 Personal history of transient ischemic attack (TIA), and cerebral infarction without residual deficits: Secondary | ICD-10-CM | POA: Diagnosis not present

## 2019-11-27 LAB — POCT INR: INR: 2.1 (ref 2.0–3.0)

## 2019-11-27 NOTE — Patient Instructions (Signed)
-   STOP LOVENOX! - continue warfarin dosage of 1 tablet (5 mg) every day except 1/2 tab (2.5 mg) on M, W, F - recheck in 2 weeks

## 2019-11-27 NOTE — Progress Notes (Addendum)
Pt presented to office via hospital wheelchair.  Her husband is present w/ her in the waiting room in a wheelchair, as well.  He asked me to take her to the exam room and then come back & get him. Advised pt's husband of the visitor policy, that I would complete her visit and bring her back out.  Once in my office, pt asked if I was going to her husband.  Advised her that the [facility] visitor policy states that visitors are not allowed unless a pt has a cognitive deficit that would warrant someone needing to come back. She stated that her husband "does not take no for an answer" and that she is surprised that he has not had someone bring him back to my office.  She stated that he has cussed out several people at the hospital for not doing as he asked and that he "just cussed some little girl out at the hospital until she gave up".  Advised her that if he shows hostile behavior in our office he will not be allowed back in the facility. She then asked for clarification on what a cognitive deficit was.  I told that for example, I have some pts w/ dementia that require a caregiver to come w/ them to their visit.  She then stated that she has some memory issues and could not remember the date (of note, she had no issues answering any questions up until that point and was able to provide details about her meds and eating habits for each day prior). Pt then asked me to adjust the brace on her arm.  Pt is s/p shoulder surgery on 11/21/19; advised her that the operating MD would need to adjust any devices that he applied and recommended they ask ortho to take a look at it.  2 volunteers were called to escort pt and her husband back to [place] parking.

## 2019-12-11 ENCOUNTER — Ambulatory Visit (INDEPENDENT_AMBULATORY_CARE_PROVIDER_SITE_OTHER): Payer: Medicare Other

## 2019-12-11 ENCOUNTER — Other Ambulatory Visit: Payer: Self-pay

## 2019-12-11 DIAGNOSIS — I4891 Unspecified atrial fibrillation: Secondary | ICD-10-CM

## 2019-12-11 DIAGNOSIS — Z8673 Personal history of transient ischemic attack (TIA), and cerebral infarction without residual deficits: Secondary | ICD-10-CM

## 2019-12-11 DIAGNOSIS — Z5181 Encounter for therapeutic drug level monitoring: Secondary | ICD-10-CM

## 2019-12-11 DIAGNOSIS — I480 Paroxysmal atrial fibrillation: Secondary | ICD-10-CM

## 2019-12-11 LAB — POCT INR: INR: 2.2 (ref 2.0–3.0)

## 2019-12-11 NOTE — Patient Instructions (Signed)
-   continue warfarin dosage of 5 mg every day except 1/2 tab (2.5 mg) on M, W, F - recheck in 3 weeks

## 2019-12-22 ENCOUNTER — Other Ambulatory Visit: Payer: Self-pay | Admitting: Gastroenterology

## 2019-12-29 ENCOUNTER — Other Ambulatory Visit: Payer: Self-pay

## 2019-12-29 ENCOUNTER — Emergency Department: Payer: Medicare Other

## 2019-12-29 ENCOUNTER — Emergency Department
Admission: EM | Admit: 2019-12-29 | Discharge: 2019-12-29 | Disposition: A | Discharge status: Home / Self Care | Payer: Medicare Other | Attending: Emergency Medicine | Admitting: Emergency Medicine

## 2019-12-29 DIAGNOSIS — Z794 Long term (current) use of insulin: Secondary | ICD-10-CM | POA: Diagnosis not present

## 2019-12-29 DIAGNOSIS — Z950 Presence of cardiac pacemaker: Secondary | ICD-10-CM | POA: Insufficient documentation

## 2019-12-29 DIAGNOSIS — R112 Nausea with vomiting, unspecified: Secondary | ICD-10-CM | POA: Insufficient documentation

## 2019-12-29 DIAGNOSIS — I509 Heart failure, unspecified: Secondary | ICD-10-CM | POA: Insufficient documentation

## 2019-12-29 DIAGNOSIS — I480 Paroxysmal atrial fibrillation: Secondary | ICD-10-CM | POA: Insufficient documentation

## 2019-12-29 DIAGNOSIS — E039 Hypothyroidism, unspecified: Secondary | ICD-10-CM | POA: Insufficient documentation

## 2019-12-29 DIAGNOSIS — I130 Hypertensive heart and chronic kidney disease with heart failure and stage 1 through stage 4 chronic kidney disease, or unspecified chronic kidney disease: Secondary | ICD-10-CM | POA: Diagnosis not present

## 2019-12-29 DIAGNOSIS — R1013 Epigastric pain: Secondary | ICD-10-CM | POA: Diagnosis present

## 2019-12-29 DIAGNOSIS — Z79899 Other long term (current) drug therapy: Secondary | ICD-10-CM | POA: Insufficient documentation

## 2019-12-29 DIAGNOSIS — N184 Chronic kidney disease, stage 4 (severe): Secondary | ICD-10-CM | POA: Insufficient documentation

## 2019-12-29 DIAGNOSIS — E119 Type 2 diabetes mellitus without complications: Secondary | ICD-10-CM | POA: Insufficient documentation

## 2019-12-29 LAB — COMPREHENSIVE METABOLIC PANEL
ALT: 18 U/L (ref 0–44)
AST: 33 U/L (ref 15–41)
Albumin: 3.3 g/dL — ABNORMAL LOW (ref 3.5–5.0)
Alkaline Phosphatase: 158 U/L — ABNORMAL HIGH (ref 38–126)
Anion gap: 11 (ref 5–15)
BUN: 25 mg/dL — ABNORMAL HIGH (ref 8–23)
CO2: 23 mmol/L (ref 22–32)
Calcium: 8.6 mg/dL — ABNORMAL LOW (ref 8.9–10.3)
Chloride: 99 mmol/L (ref 98–111)
Creatinine, Ser: 1.66 mg/dL — ABNORMAL HIGH (ref 0.44–1.00)
GFR calc Af Amer: 34 mL/min — ABNORMAL LOW (ref >60)
GFR calc non Af Amer: 29 mL/min — ABNORMAL LOW (ref >60)
Glucose, Bld: 235 mg/dL — ABNORMAL HIGH (ref 70–99)
Potassium: 4.9 mmol/L (ref 3.5–5.1)
Sodium: 133 mmol/L — ABNORMAL LOW (ref 135–145)
Total Bilirubin: 1.4 mg/dL — ABNORMAL HIGH (ref 0.3–1.2)
Total Protein: 6.9 g/dL (ref 6.5–8.1)

## 2019-12-29 LAB — CBC WITH DIFFERENTIAL/PLATELET
Abs Immature Granulocytes: 0.02 10*3/uL (ref 0.00–0.07)
Basophils Absolute: 0 10*3/uL (ref 0.0–0.1)
Basophils Relative: 0 %
Eosinophils Absolute: 0 10*3/uL (ref 0.0–0.5)
Eosinophils Relative: 0 %
HCT: 36.1 % (ref 36.0–46.0)
Hemoglobin: 11.7 g/dL — ABNORMAL LOW (ref 12.0–15.0)
Immature Granulocytes: 0 %
Lymphocytes Relative: 13 %
Lymphs Abs: 0.8 10*3/uL (ref 0.7–4.0)
MCH: 29.9 pg (ref 26.0–34.0)
MCHC: 32.4 g/dL (ref 30.0–36.0)
MCV: 92.3 fL (ref 80.0–100.0)
Monocytes Absolute: 0.4 10*3/uL (ref 0.1–1.0)
Monocytes Relative: 7 %
Neutro Abs: 4.6 10*3/uL (ref 1.7–7.7)
Neutrophils Relative %: 80 %
Platelets: 227 10*3/uL (ref 150–400)
RBC: 3.91 MIL/uL (ref 3.87–5.11)
RDW: 15.1 % (ref 11.5–15.5)
WBC: 5.8 10*3/uL (ref 4.0–10.5)
nRBC: 0 % (ref 0.0–0.2)

## 2019-12-29 LAB — URINALYSIS, COMPLETE (UACMP) WITH MICROSCOPIC
Bilirubin Urine: NEGATIVE
Glucose, UA: NEGATIVE mg/dL
Hgb urine dipstick: NEGATIVE
Ketones, ur: 5 mg/dL — AB
Nitrite: NEGATIVE
Protein, ur: NEGATIVE mg/dL
Specific Gravity, Urine: 1.012 (ref 1.005–1.030)
pH: 7 (ref 5.0–8.0)

## 2019-12-29 LAB — LIPASE, BLOOD: Lipase: 18 U/L (ref 11–51)

## 2019-12-29 MED ORDER — ONDANSETRON HCL 4 MG/2ML IJ SOLN
4.0000 mg | Freq: Once | INTRAMUSCULAR | Status: AC
  Administered 2019-12-29: 07:00:00 4 mg via INTRAVENOUS
  Filled 2019-12-29: qty 2

## 2019-12-29 MED ORDER — ONDANSETRON 4 MG PO TBDP: 4.0000 mg | ORAL_TABLET | Freq: Three times a day (TID) | ORAL | 0 refills | Status: DC | PRN

## 2019-12-29 MED ORDER — MORPHINE SULFATE (PF) 2 MG/ML IV SOLN
2.0000 mg | Freq: Once | INTRAVENOUS | Status: AC
  Administered 2019-12-29: 2 mg via INTRAVENOUS
  Filled 2019-12-29: qty 1

## 2019-12-29 MED ORDER — MORPHINE SULFATE (PF) 2 MG/ML IV SOLN
2.0000 mg | Freq: Once | INTRAVENOUS | Status: AC
  Administered 2019-12-29: 08:00:00 2 mg via INTRAVENOUS
  Filled 2019-12-29: qty 1

## 2019-12-29 NOTE — ED Provider Notes (Signed)
-----------------------------------------   8:47 AM on 12/29/2019 -----------------------------------------  Patient's work-up is overall reassuring.  Urinalysis appears more consistent with contamination opposed to UTI as the patient has no symptoms of UTI, however we will send a urine culture as a precaution.  Remainder the patient's lab work is largely [place].  Patient CT scan shows mild constipation otherwise negative.  We will discharge patient home with PCP follow-up.   Harvest Dark, MD 12/29/19 [phone]

## 2019-12-29 NOTE — ED Notes (Signed)
Pt transported to CT ?

## 2019-12-29 NOTE — ED Provider Notes (Signed)
[facility] Emergency Department Provider Note  ____________________________________________   First MD Initiated Contact with Patient 12/29/19 [phone]     (approximate)  I have reviewed the triage vital signs and the nursing notes.   HISTORY  Chief Complaint Abdominal Cramping, Nausea, and Emesis    HPI Deborah Obrien is a 79 y.o. female with below list of previous medical conditions presents to the emergency department secondary to acute onset of epigastric abdominal cramping nausea and vomiting that began after eating steak at a restaurant at 7:30 PM last night.  Patient denies any fever no diarrhea.  Patient states that she has had multiple episodes of nonbloody emesis since onset.     Past Medical History:  Diagnosis Date  . A-fib ([place]) 2016  . Acid reflux   . Anemia   . Anxiety   . Bowel obstruction ([place])   . Carotid artery disease ([place])   . CHF (congestive heart failure) ([place]) 2017  . Chronic kidney disease (CKD) stage G3a/A1, moderately decreased glomerular filtration rate (GFR) between 45-59 mL/min/1.73 square meter and albuminuria creatinine ratio less than 30 mg/g    stage IV  . Coronary artery disease   . Depression   . Diabetes mellitus without complication ([place])   . Dyspnea   . Dysrhythmia    PAROXYSMAL ATRIAL FIBRILLATION  . Fatty liver   . GI bleed   . Gout   . Hypertension   . Hypothyroidism   . Low back pain    history of kyphoplasty t12-l1  . Morbid obesity ([place])   . Multiple gastric polyps    history of gi bleed.  polyps removed  . Neuromuscular disorder ([place])    diabetic neuropathy  . Osteoarthritis   . Osteoporosis   . Paroxysmal atrial fibrillation (HCC)   . Presence of permanent cardiac pacemaker   . Sinus node dysfunction ([place]) 08/24/2017  . Stroke [facility])    TIA, patient unaware that she had it. no residual  . TIA (transient ischemic attack)     Patient Active Problem List   Diagnosis Date Noted  . RUQ  pain   . UTI (urinary tract infection) 06/14/2018  . Type 2 diabetes mellitus with neurological complications ([place]) 38/18/2993  . Acute respiratory failure ([place]) 04/09/2018  . Chronic anticoagulation 04/09/2018  . Pacemaker 04/09/2018  . H/O TIA (transient ischemic attack) and stroke 04/09/2018  . CAD (coronary artery disease) 04/09/2018  . Hematemesis 04/05/2018  . Hypotension 02/04/2018  . Atrial fibrillation with RVR ([place]) 02/04/2018  . Healthcare maintenance 09/22/2017  . Atrial flutter ([place]) 08/24/2017  . Sinus node dysfunction ([place]) 08/24/2017  . Dizziness 06/12/2017  . Secondary hyperparathyroidism of renal origin ([place]) 05/09/2017  . Mastalgia 02/14/2017  . Acute on chronic renal insufficiency 01/09/2017  . Fatty liver 01/09/2017  . Anemia in stage 3 chronic kidney disease 11/02/2016  . Iron deficiency anemia 11/02/2016  . Benign essential HTN 09/08/2016  . Nephrolithiasis 02/22/2016  . Diabetic neuropathy with neurologic complication ([place]) 71/69/6789  . Moderate episode of recurrent major depressive disorder ([place]) 08/11/2015  . Polyp of stomach and duodenum 07/29/2015  . Gastritis without bleeding   . Gastric polyp   . Gastro-esophageal reflux disease without esophagitis   . PAF (paroxysmal atrial fibrillation) ([place]) 06/29/2015  . Intestinal obstruction ([place]) 02/20/2015  . DDD (degenerative disc disease), lumbar 01/19/2015  . Back pain 01/12/2015  . Gout 01/12/2015  . Other specified postprocedural states 11/04/2014  . Complete tear of left rotator cuff  10/16/2014  . Intervertebral disc disorder with radiculopathy of lumbar region 09/17/2014  . Lumbar radiculitis 09/17/2014  . Lumbar stenosis with neurogenic claudication 09/17/2014  . Neuritis or radiculitis due to rupture of lumbar intervertebral disc 09/17/2014  . Atherosclerosis of both carotid arteries 07/15/2014  . Essential hypertension 07/15/2014  . Hyperlipemia, mixed 07/15/2014  . Multiple-type  hyperlipidemia 07/15/2014  . Body mass index (BMI) of 40.0-44.9 in adult ([place]) 06/21/2014  . Mixed incontinence 06/25/2013  . Mixed urge and stress incontinence 06/25/2013  . Nocturia 06/25/2013  . Blurring of visual image 04/11/2012  . Other visual disturbances 04/11/2012    Past Surgical History:  Procedure Laterality Date  . ABDOMINAL HYSTERECTOMY  1980  . APPENDECTOMY    . BACK SURGERY     2008. plate & screws   . BACK SURGERY  2019   patient describes kyphoplasty for compression fractures, MD referral said fusion  . BREAST BIOPSY Right 08/16   stereo fibroadenomatous change, neg for atypia  . BREAST EXCISIONAL BIOPSY Left    neg  . CARDIAC CATHETERIZATION Left 08/25/2016   Procedure: Left Heart Cath and Coronary Angiography;  Surgeon: Corey Skains, MD;  Location: [place] [facility];  Service: Cardiovascular;  Laterality: Left;  . CARDIOVERSION N/A 06/28/2017   Procedure: CARDIOVERSION;  Surgeon: Corey Skains, MD;  Location: [facility];  Service: Cardiovascular;  Laterality: N/A;  . CARDIOVERSION N/A 02/06/2018   Procedure: CARDIOVERSION;  Surgeon: Josue Hector, MD;  Location: [place];  Service: Cardiovascular;  Laterality: N/A;  . CATARACT EXTRACTION, BILATERAL  2012  . CHOLECYSTECTOMY    . colon blockage  1999  . COLON SURGERY  2000   removed 20% of colon. colon had collapsed  . COLONOSCOPY  2016   polyps removed 2016  . DORSAL COMPARTMENT RELEASE Left 09/14/2016   Procedure: RELEASE DORSAL COMPARTMENT ([place]);  Surgeon: Dereck Leep, MD;  Location: [facility];  Service: Orthopedics;  Laterality: Left;  . ESOPHAGOGASTRODUODENOSCOPY N/A 01/27/2015   Procedure: ESOPHAGOGASTRODUODENOSCOPY (EGD);  Surgeon: Lollie Sails, MD;  Location: [facility] ENDOSCOPY;  Service: Endoscopy;  Laterality: N/A;  . ESOPHAGOGASTRODUODENOSCOPY (EGD) WITH PROPOFOL N/A 07/24/2015   Procedure: ESOPHAGOGASTRODUODENOSCOPY (EGD) WITH PROPOFOL;  Surgeon: Lucilla Lame, MD;  Location:  [facility] ENDOSCOPY;  Service: Endoscopy;  Laterality: N/A;  . ESOPHAGOGASTRODUODENOSCOPY (EGD) WITH PROPOFOL N/A 04/12/2018   Procedure: ESOPHAGOGASTRODUODENOSCOPY (EGD) WITH PROPOFOL;  Surgeon: Jonathon Bellows, MD;  Location: [facility] ENDOSCOPY;  Service: Gastroenterology;  Laterality: N/A;  . ESOPHAGOGASTRODUODENOSCOPY (EGD) WITH PROPOFOL N/A 06/22/2018   Procedure: ESOPHAGOGASTRODUODENOSCOPY (EGD) WITH PROPOFOL;  Surgeon: [name], MD;  Location: [facility] ENDOSCOPY;  Service: Endoscopy;  Laterality: N/A;  . ESOPHAGOGASTRODUODENOSCOPY (EGD) WITH PROPOFOL N/A 07/09/2018   Procedure: ESOPHAGOGASTRODUODENOSCOPY (EGD) WITH PROPOFOL with resection of gastric polyps;  Surgeon: Jonathon Bellows, MD;  Location: [facility] ENDOSCOPY;  Service: Gastroenterology;  Laterality: N/A;  . ESOPHAGOGASTRODUODENOSCOPY (EGD) WITH PROPOFOL N/A 05/17/2019   Procedure: ESOPHAGOGASTRODUODENOSCOPY (EGD) WITH PROPOFOL;  Surgeon: Jonathon Bellows, MD;  Location: [facility] ENDOSCOPY;  Service: Gastroenterology;  Laterality: N/A;  . EUS N/A 06/22/2018   Procedure: UPPER ENDOSCOPIC ULTRASOUND (EUS) RADIAL;  Surgeon: [name], MD;  Location: [facility] ENDOSCOPY;  Service: Endoscopy;  Laterality: N/A;  . EYE SURGERY Bilateral    cataract extractions  . JOINT REPLACEMENT Bilateral 2014   Bilateral Knee replacement  . LEAD REVISION/REPAIR N/A 08/29/2017   Procedure: LEAD REVISION/REPAIR;  Surgeon: Constance Haw, MD;  Location: [place] [facility];  Service: Cardiovascular;  Laterality: N/A;  .  OOPHORECTOMY    . PACEMAKER INSERTION Left 07/01/2015   Procedure: INSERTION PACEMAKER;  Surgeon: Isaias Cowman, MD;  Location: [facility];  Service: Cardiovascular;  Laterality: Left;  . PACEMAKER REVISION N/A 08/28/2017   Procedure: PACEMAKER REVISION;  Surgeon: Deboraha Sprang, MD;  Location: [place] CV [facility];  Service: Cardiovascular;  Laterality: N/A;  . REPLACEMENT TOTAL KNEE BILATERAL    . SHOULDER ARTHROSCOPY WITH ROTATOR CUFF REPAIR AND OPEN BICEPS  TENODESIS Left 11/21/2019   Procedure: LEFT SHOULDER ARTHROSCOPY WITH DEBRIDEMENT, DECOMPRESSION, ROTATOR CUFF REPAIR AND BICEPS TENOLYSIS;  Surgeon: Corky Mull, MD;  Location: [facility];  Service: Orthopedics;  Laterality: Left;  . SHOULDER ARTHROSCOPY WITH SUBACROMIAL DECOMPRESSION Left 2013  . TEE WITHOUT CARDIOVERSION N/A 06/28/2017   Procedure: TRANSESOPHAGEAL ECHOCARDIOGRAM (TEE);  Surgeon: Corey Skains, MD;  Location: [facility];  Service: Cardiovascular;  Laterality: N/A;    Prior to Admission medications   Medication Sig Start Date End Date Taking? Authorizing Provider  allopurinol (ZYLOPRIM) 100 MG tablet Take 150 mg by mouth daily.     [provider]  amiodarone (PACERONE) 200 MG tablet Take 1 tablet (200 mg total) by mouth daily. 10/28/19   Deboraha Sprang, MD  busPIRone (BUSPAR) 10 MG tablet Take 10 mg by mouth 2 (two) times daily.     [provider]  colchicine 0.6 MG tablet Take 0.6 mg by mouth daily as needed (gout).     [provider]  Cyanocobalamin 2500 MCG SUBL Place 2,500 mcg under the tongue daily.     [provider]  docusate sodium (COLACE) 100 MG capsule Take 200 mg by mouth 2 (two) times daily.    [provider]  DULoxetine (CYMBALTA) 30 MG capsule Take 30 mg by mouth daily.  03/25/19 03/24/20  [provider]  enoxaparin (LOVENOX) 100 MG/ML injection Inject 1 mL (100 mg total) into the skin every 12 (twelve) hours. 11/13/19   Deboraha Sprang, MD  Ergocalciferol (VITAMIN D2) 10 MCG (400 UNIT) TABS Take 400 Units by mouth in the morning and at bedtime.    [provider]  furosemide (LASIX) 20 MG tablet Take 20 mg by mouth daily.    [provider]  gabapentin (NEURONTIN) 600 MG tablet Take 600 mg by mouth 3 (three) times daily.    [provider]  HYDROcodone-acetaminophen (NORCO) 10-325 MG tablet Take 1 tablet by mouth every 4 (four) hours as needed for moderate pain. 11/21/19   Poggi,  Marshall Cork, MD  insulin NPH-regular Human (NOVOLIN 70/30) (70-30) 100 UNIT/ML injection Inject 25 Units into the skin 2 (two) times daily with a meal.     [provider]  ketoconazole (NIZORAL) 2 % cream Apply 1 application topically daily as needed for irritation.    [provider]  levothyroxine (SYNTHROID) 50 MCG tablet Take 50 mcg by mouth daily before breakfast.    [provider]  oxybutynin (DITROPAN) 5 MG tablet Take 10 mg by mouth 2 (two) times daily.    [provider]  pantoprazole (PROTONIX) 40 MG tablet Take 1 tablet (40 mg total) by mouth daily. 12/23/19   Jonathon Bellows, MD  Polyethyl Glycol-Propyl Glycol (SYSTANE) 0.4-0.3 % SOLN Place 2 drops into both eyes 2 (two) times daily as needed (dry eyes).     [provider]  pravastatin (PRAVACHOL) 20 MG tablet Take 20 mg by mouth every evening. 07/25/18   [provider]  talc (ZEASORB) powder Apply 1 application topically  as needed (under the breast irritation).    [provider]  traZODone (DESYREL) 50 MG tablet Take 50 mg by mouth at bedtime.  06/07/18   [provider]  vitamin C (ASCORBIC ACID) 500 MG tablet Take 500 mg by mouth 2 (two) times daily.    [provider]  warfarin (COUMADIN) 5 MG tablet Take 1 tablet (5 mg total) by mouth daily at 6 PM. 10/30/19   Deboraha Sprang, MD    Allergies Fish allergy, Macrolides and ketolides, Meperidine, Other, Prednisone, Shellfish allergy, Sulfa antibiotics, Sulfacetamide sodium, Telbivudine, Uloric [febuxostat], Aspirin, Celecoxib, Cephalexin, Codeine, Dilaudid [hydromorphone hcl], Erythromycin, Hydromorphone, Iodinated diagnostic agents, Lipitor [atorvastatin calcium], Oxycodone, Aleve [naproxen], Atorvastatin, Cephalosporins, Doxycycline, Iodine, Motrin [ibuprofen], Tape, and Valdecoxib  Family History  Problem Relation Age of Onset  . Diabetes Mellitus II Mother   . CAD Mother   . Heart attack Mother   .  Cancer Father        skin  . Breast cancer Neg Hx     Social History Social History   Tobacco Use  . Smoking status: Never Smoker  . Smokeless tobacco: Never Used  Substance Use Topics  . Alcohol use: No  . Drug use: No    Review of Systems Constitutional: No fever/chills Eyes: No visual changes. ENT: No sore throat. Cardiovascular: Denies chest pain. Respiratory: Denies shortness of breath. Gastrointestinal: Positive for abdominal pain and vomiting Genitourinary: Negative for dysuria. Musculoskeletal: Negative for neck pain.  Negative for back pain. Integumentary: Negative for rash. Neurological: Negative for headaches, focal weakness or numbness.   ____________________________________________   PHYSICAL EXAM:  VITAL SIGNS: ED Triage Vitals  Enc Vitals Group     BP --      Pulse --      Resp --      Temp --      Temp src --      SpO2 --      Weight 12/29/19 0555 96.2 kg (212 lb)     Height 12/29/19 0555 1.626 m (5\' 4" )     Head Circumference --      Peak Flow --      Pain Score 12/29/19 0554 7     Pain Loc --      Pain Edu? --      Excl. in [place]? --      Constitutional: Alert and oriented.  Eyes: Conjunctivae are normal.  Mouth/Throat: Patient is wearing a mask. Neck: No stridor.  No meningeal signs.   Cardiovascular: Normal rate, regular rhythm. Good peripheral circulation. Grossly normal heart sounds. Respiratory: Normal respiratory effort.  No retractions. Gastrointestinal: Epigastric tenderness to palpation. No distention.  Musculoskeletal: No lower extremity tenderness nor edema. No gross deformities of extremities. Neurologic:  Normal speech and language. No gross focal neurologic deficits are appreciated.  Skin:  Skin is warm, dry and intact. Psychiatric: Mood and affect are normal. Speech and behavior are normal.  ____________________________________________   LABS (all labs ordered are listed, but only abnormal results are  displayed)  Labs Reviewed  CBC WITH DIFFERENTIAL/PLATELET - Abnormal; Notable for the following components:      Result Value   Hemoglobin 11.7 (*)    All other components within normal limits  COMPREHENSIVE METABOLIC PANEL - Abnormal; Notable for the following components:   Sodium 133 (*)    Glucose, Bld 235 (*)    BUN 25 (*)    Creatinine, Ser 1.66 (*)    Calcium 8.6 (*)  Albumin 3.3 (*)    Alkaline Phosphatase 158 (*)    Total Bilirubin 1.4 (*)    GFR calc non Af Amer 29 (*)    GFR calc Af Amer 34 (*)    All other components within normal limits  LIPASE, BLOOD   ____________________________________________  EKG  ED ECG REPORT I, [place] N Katelynn Heidler, the attending physician, personally viewed and interpreted this ECG.   Date: 12/29/2019  EKG Time: 5:59 AM  Rate: 64  Rhythm: Normal sinus rhythm  Axis: Normal  Intervals: Normal  ST&T Change: None  ____________________________________________  RADIOLOGY I, [place] N Ever Halberg, personally viewed and evaluated these images (plain radiographs) as part of my medical decision making, as well as reviewing the written report by the radiologist.  ED MD interpretation:    Official radiology report(s): CT ABDOMEN PELVIS WO CONTRAST  Result Date: 12/29/2019 CLINICAL DATA:  Abdominal cramping. Nausea. Vomiting. Contrast allergy. EXAM: CT ABDOMEN AND PELVIS WITHOUT CONTRAST TECHNIQUE: Multidetector CT imaging of the abdomen and pelvis was performed following the standard protocol without IV contrast. COMPARISON:  06/17/2018 abdominal ultrasound. Most recent CT 06/14/2018. FINDINGS: Lower chest: Left base scarring. Pacer. Normal heart size without pericardial or pleural effusion. Tiny hiatal hernia. Hepatobiliary: Normal liver. Cholecystectomy, without biliary ductal dilatation. Pancreas: Fatty replacement/atrophy throughout the pancreas. No duct dilatation or acute inflammation. Spleen: Normal in size, without focal abnormality.  Adrenals/Urinary Tract: Normal adrenal glands. Mild renal cortical thinning bilaterally. No renal calculi or hydronephrosis. No bladder calculi. Stomach/Bowel: Normal remainder of the stomach. Colonic stool burden suggests constipation. Surgical sutures at the rectosigmoid junction. Normal terminal ileum. Appendectomy per clinical history. Normal small bowel. Vascular/Lymphatic: Aortic atherosclerosis. No abdominopelvic adenopathy. Reproductive: Hysterectomy.  No adnexal mass. Other: No significant free fluid. Moderate pelvic floor laxity No free intraperitoneal air. Presumed injection sites about the anterior abdominal wall subcutaneous fat. Musculoskeletal: Degenerative changes of the hips. Lumbar spine fixation. Osteopenia. L4-S1 trans pedicle screw fixation. Vertebral augmentation at T12 and L1 with compression deformities and canal encroachment at T12, similar. IMPRESSION: 1. Possible constipation. 2.  No acute process in the abdomen or pelvis. 3. Tiny hiatal hernia. 4. Aortic Atherosclerosis (ICD10-I70.0). Electronically Signed   By: Abigail Miyamoto M.D.   On: 12/29/2019 07:38      Procedures   ____________________________________________   INITIAL IMPRESSION / MDM / ASSESSMENT AND PLAN / ED COURSE  As part of my medical decision making, I reviewed the following data within the electronic MEDICAL RECORD NUMBER   79 year old female presented with above-stated history and physical exam with differential diagnosis including but not limited to gastroenteritis versus other potential intra-abdominal pathology including diverticulitis colitis.  Patient given IV Zofran 4 mg and IV morphine 2 mg CT scan pending as well as laboratory data.  Patient's care transferred to Dr. Kerman Passey I suspect if CT scan is negative that the patient will be able to be discharged home with p.o. Zofran  ____________________________________________  FINAL CLINICAL IMPRESSION(S) / ED DIAGNOSES  Final diagnoses:   Non-intractable vomiting with nausea, unspecified vomiting type     MEDICATIONS GIVEN DURING THIS VISIT:  Medications  morphine 2 MG/ML injection 2 mg (2 mg Intravenous Given 12/29/19 0644)  ondansetron (ZOFRAN) injection 4 mg (4 mg Intravenous Given 12/29/19 1751)     ED Discharge Orders    None      *Please note:  Deborah Obrien was evaluated in Emergency Department on 12/29/2019 for the symptoms described in the history of present illness. She was evaluated in  the context of the global COVID-19 pandemic, which necessitated consideration that the patient might be at risk for infection with the SARS-CoV-2 virus that causes COVID-19. Institutional protocols and algorithms that pertain to the evaluation of patients at risk for COVID-19 are in a state of rapid change based on information released by regulatory bodies including the CDC and federal and state organizations. These policies and algorithms were followed during the patient's care in the ED.  Some ED evaluations and interventions may be delayed as a result of limited staffing during the pandemic.*  Note:  This document was prepared using Dragon voice recognition software and may include unintentional dictation errors.   Gregor Hams, MD 12/29/19 [phone]

## 2019-12-29 NOTE — ED Notes (Signed)
IV team at bedside 

## 2019-12-29 NOTE — ED Triage Notes (Signed)
Pt arrives from home via [facility].  She reports she ate a steak from a seafood restaurant around 730pm and started having abdominal cramps, nausea and vomiting a couple of hours later. Known seafood allergy.    EMS gave 4 mg of zofran IM.  Pt arrives tearful and reports she is in pain 7/10.  No hives or rash noted.  Pt has pacemaker.

## 2019-12-29 NOTE — ED Notes (Signed)
IV team consult placed.  EMS and 2 RNs have attempted access.

## 2019-12-30 LAB — URINE CULTURE: Culture: NO GROWTH

## 2020-01-01 ENCOUNTER — Other Ambulatory Visit: Payer: Self-pay

## 2020-01-01 ENCOUNTER — Ambulatory Visit (INDEPENDENT_AMBULATORY_CARE_PROVIDER_SITE_OTHER): Payer: Medicare Other

## 2020-01-01 DIAGNOSIS — Z5181 Encounter for therapeutic drug level monitoring: Secondary | ICD-10-CM | POA: Diagnosis not present

## 2020-01-01 DIAGNOSIS — Z8673 Personal history of transient ischemic attack (TIA), and cerebral infarction without residual deficits: Secondary | ICD-10-CM | POA: Diagnosis not present

## 2020-01-01 DIAGNOSIS — I4891 Unspecified atrial fibrillation: Secondary | ICD-10-CM | POA: Diagnosis not present

## 2020-01-01 DIAGNOSIS — I480 Paroxysmal atrial fibrillation: Secondary | ICD-10-CM | POA: Diagnosis not present

## 2020-01-01 LAB — POCT INR: INR: 1.6 — AB (ref 2.0–3.0)

## 2020-01-01 NOTE — Patient Instructions (Signed)
-   take 1 tablet tonight, 1.5 tablets tomorrow, then - continue warfarin dosage of 5 mg every day except 1/2 tab (2.5 mg) on M, W, F - recheck in 3 weeks

## 2020-01-03 ENCOUNTER — Other Ambulatory Visit: Payer: Self-pay | Admitting: Internal Medicine

## 2020-01-03 MED ORDER — WARFARIN SODIUM 5 MG PO TABS: ORAL_TABLET | ORAL | 1 refills | Status: DC

## 2020-01-03 NOTE — Telephone Encounter (Signed)
°*  STAT* If patient is at the pharmacy, call can be transferred to refill team.   1. Which medications need to be refilled? (please list name of each medication and dose if known) warfarin  5mg   2. Which pharmacy/location (including street and city if local pharmacy) is medication to be sent to? [facility] on [address]  3. Do they need a 30 day or 90 day supply? [place]

## 2020-01-20 ENCOUNTER — Other Ambulatory Visit: Payer: Self-pay | Admitting: *Deleted

## 2020-01-20 DIAGNOSIS — I480 Paroxysmal atrial fibrillation: Secondary | ICD-10-CM

## 2020-01-20 MED ORDER — AMIODARONE HCL 200 MG PO TABS: 200.0000 mg | ORAL_TABLET | Freq: Every day | ORAL | 0 refills | Status: DC

## 2020-01-22 ENCOUNTER — Ambulatory Visit (INDEPENDENT_AMBULATORY_CARE_PROVIDER_SITE_OTHER): Payer: Medicare Other

## 2020-01-22 ENCOUNTER — Other Ambulatory Visit: Payer: Self-pay

## 2020-01-22 DIAGNOSIS — Z5181 Encounter for therapeutic drug level monitoring: Secondary | ICD-10-CM | POA: Diagnosis not present

## 2020-01-22 DIAGNOSIS — I4891 Unspecified atrial fibrillation: Secondary | ICD-10-CM

## 2020-01-22 DIAGNOSIS — I480 Paroxysmal atrial fibrillation: Secondary | ICD-10-CM

## 2020-01-22 DIAGNOSIS — Z8673 Personal history of transient ischemic attack (TIA), and cerebral infarction without residual deficits: Secondary | ICD-10-CM | POA: Diagnosis not present

## 2020-01-22 LAB — POCT INR: INR: 1.7 — AB (ref 2.0–3.0)

## 2020-01-22 NOTE — Patient Instructions (Signed)
-   START NEW warfarin dosage of 5 mg every day  - recheck in 3 weeks

## 2020-02-12 ENCOUNTER — Ambulatory Visit (INDEPENDENT_AMBULATORY_CARE_PROVIDER_SITE_OTHER): Payer: Medicare Other

## 2020-02-12 ENCOUNTER — Other Ambulatory Visit: Payer: Self-pay

## 2020-02-12 DIAGNOSIS — I480 Paroxysmal atrial fibrillation: Secondary | ICD-10-CM

## 2020-02-12 DIAGNOSIS — Z5181 Encounter for therapeutic drug level monitoring: Secondary | ICD-10-CM | POA: Diagnosis not present

## 2020-02-12 DIAGNOSIS — I4891 Unspecified atrial fibrillation: Secondary | ICD-10-CM | POA: Diagnosis not present

## 2020-02-12 DIAGNOSIS — Z8673 Personal history of transient ischemic attack (TIA), and cerebral infarction without residual deficits: Secondary | ICD-10-CM | POA: Diagnosis not present

## 2020-02-12 LAB — POCT INR: INR: 2.7 (ref 2.0–3.0)

## 2020-02-12 NOTE — Patient Instructions (Signed)
-   continue warfarin dosage of 5 mg every day  - recheck in 4 weeks

## 2020-02-13 ENCOUNTER — Ambulatory Visit (INDEPENDENT_AMBULATORY_CARE_PROVIDER_SITE_OTHER): Payer: Medicare Other | Admitting: Gastroenterology

## 2020-02-13 ENCOUNTER — Encounter: Payer: Self-pay | Admitting: Gastroenterology

## 2020-02-13 VITALS — BP 94/65 | HR 71 | Temp 98.0°F

## 2020-02-13 DIAGNOSIS — M810 Age-related osteoporosis without current pathological fracture: Secondary | ICD-10-CM | POA: Insufficient documentation

## 2020-02-13 DIAGNOSIS — K317 Polyp of stomach and duodenum: Secondary | ICD-10-CM

## 2020-02-13 NOTE — Patient Instructions (Signed)
Please go to any [facility] location and have your labs drawn or you could come back to our office on the following days: Mondays: 1 PM-4:30 PM Tuesdays: 8:30 AM-4:30 PM Wednesdays: 8:30 AM-4:30 PM Thursdays: 1 PM-4:30 PM

## 2020-02-13 NOTE — Progress Notes (Signed)
Jonathon Bellows MD, MRCP(U.K) [address]  [place]  [place], [place] [address]  Main: [phone]  Fax: [phone]   Primary Care Physician: [name], MD  Primary Gastroenterologist:  Dr. Jonathon Bellows   Gastric polyps  HPI: Deborah Obrien is a 79 y.o. female    Summary of history :  Deborah Obrien was admittedin mid 2019with a severe upper GI bleed. I performed an upper endoscopy and noted a large gastric polyp that was bleeding. At that point of time I sprayed hemosprayand obtained hemostasis. Shewason Eliquis for atrial fibrillation and she has hada history of strokes.  She underwent an USG RUQ which showed a mildly dilated CBD 9 mm, she underwent an EUS at [facility] after and found no stones in her CBD and was discharged. 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 Ct abdomen 06/14/18 showed no acute findings - atrophic pancreatitis. Features of prior kyphoplasty at t12- L1  EGD 07/09/18 : EGD: 4 large gastric polyps were resected. All were hyperplastic , few other smaller polyps seen but not resected.  05/17/2019: EGD: Notable small sessile polyps resected. A 15 mm sessile polyp was also resected by EMR. All polyps were gastric hyperplastic polyps with surface elevation and inflammation were negative for malignancy.  06/18/2019: Hemoglobin 12.1. 10/21/2019 hemoglobin 11.9 g with an MCV of 97. 10/21/2019: BUN of 36 and creatinine of 1.7  Interval history2/24/2021-02/13/2020  Doing well.  Normal color stools.  No abdominal pain.  Occasional symptoms of regurgitation.  Recently had shoulder surgery.  12/29/2019: Hemoglobin 11.7 g.  Stable since a year.  Current Outpatient Medications  Medication Sig Dispense Refill  . allopurinol (ZYLOPRIM) 100 MG tablet Take 150 mg by mouth daily.     Marland Kitchen amiodarone (PACERONE) 200 MG tablet Take 1 tablet (200 mg total) by mouth daily. 90 tablet 0  . busPIRone (BUSPAR) 10 MG  tablet Take 10 mg by mouth 2 (two) times daily.     . colchicine 0.6 MG tablet Take 0.6 mg by mouth daily as needed (gout).     . Cyanocobalamin 2500 MCG SUBL Place 2,500 mcg under the tongue daily.     Marland Kitchen docusate sodium (COLACE) 100 MG capsule Take 200 mg by mouth 2 (two) times daily.    . DULoxetine (CYMBALTA) 30 MG capsule Take 30 mg by mouth daily.     Marland Kitchen enoxaparin (LOVENOX) 100 MG/ML injection Inject 1 mL (100 mg total) into the skin every 12 (twelve) hours. 10 mL 1  . Ergocalciferol (VITAMIN D2) 10 MCG (400 UNIT) TABS Take 400 Units by mouth in the morning and at bedtime.    . furosemide (LASIX) 20 MG tablet Take 20 mg by mouth daily.    Marland Kitchen gabapentin (NEURONTIN) 600 MG tablet Take 600 mg by mouth 3 (three) times daily.    Marland Kitchen HYDROcodone-acetaminophen (NORCO) 10-325 MG tablet Take 1 tablet by mouth every 4 (four) hours as needed for moderate pain. 50 tablet 0  . insulin NPH-regular Human (NOVOLIN 70/30) (70-30) 100 UNIT/ML injection Inject 25 Units into the skin 2 (two) times daily with a meal.     . ketoconazole (NIZORAL) 2 % cream Apply 1 application topically daily as needed for irritation.    Marland Kitchen levothyroxine (SYNTHROID) 50 MCG tablet Take 50 mcg by mouth daily before breakfast.    . ondansetron (ZOFRAN ODT) 4 MG disintegrating tablet Take 1 tablet (4 mg total) by mouth every 8 (  eight) hours as needed. 20 tablet 0  . oxybutynin (DITROPAN) 5 MG tablet Take 10 mg by mouth 2 (two) times daily.    . pantoprazole (PROTONIX) 40 MG tablet Take 1 tablet (40 mg total) by mouth daily. 90 tablet 1  . Polyethyl Glycol-Propyl Glycol (SYSTANE) 0.4-0.3 % SOLN Place 2 drops into both eyes 2 (two) times daily as needed (dry eyes).     . pravastatin (PRAVACHOL) 20 MG tablet Take 20 mg by mouth every evening.  2  . talc (ZEASORB) powder Apply 1 application topically as needed (under the breast irritation).    . traZODone (DESYREL) 50 MG tablet Take 50 mg by mouth at bedtime.     . vitamin C (ASCORBIC ACID)  500 MG tablet Take 500 mg by mouth 2 (two) times daily.    Marland Kitchen warfarin (COUMADIN) 5 MG tablet Take 0.5-1 tablet daily as directed by coumadin clinic 30 tablet 1   No current facility-administered medications for this visit.    Allergies as of 02/13/2020 - Review Complete 12/29/2019  Allergen Reaction Noted  . Fish allergy Shortness Of Breath 11/26/2015  . Macrolides and ketolides Other (See Comments) 01/12/2015  . Meperidine Shortness Of Breath, Nausea Only, and Other (See Comments) 01/19/2015  . Other Nausea Only, Swelling, and Rash 01/12/2015  . Prednisone Anaphylaxis and Other (See Comments) 01/19/2015  . Shellfish allergy Anaphylaxis, Shortness Of Breath, Diarrhea, Nausea Only, and Rash 06/29/2015  . Sulfa antibiotics Shortness Of Breath, Diarrhea, Nausea Only, and Other (See Comments) 06/25/2013  . Sulfacetamide sodium Diarrhea, Nausea Only, Other (See Comments), and Shortness Of Breath 08/12/2015  . Telbivudine  08/12/2015  . Uloric [febuxostat] Anaphylaxis 08/22/2016  . Aspirin Other (See Comments) 01/19/2015  . Celecoxib Other (See Comments) 01/19/2015  . Cephalexin Diarrhea, Nausea Only, and Other (See Comments) 01/19/2015  . Codeine Diarrhea, Nausea Only, and Other (See Comments) 01/19/2015  . Dilaudid [hydromorphone hcl]  07/03/2018  . Erythromycin Diarrhea, Nausea Only, and Other (See Comments) 01/19/2015  . Hydromorphone Other (See Comments) 01/19/2015  . Iodinated diagnostic agents Rash and Other (See Comments) 01/19/2015  . Lipitor [atorvastatin calcium] Nausea Only 07/03/2018  . Oxycodone Other (See Comments) 01/19/2015  . Aleve [naproxen]  07/03/2018  . Atorvastatin Nausea Only and Other (See Comments) 01/19/2015  . Cephalosporins Other (See Comments) 01/19/2015  . Doxycycline  08/24/2017  . Iodine Rash 06/29/2015  . Motrin [ibuprofen]  07/03/2018  . Tape Other (See Comments) 06/29/2015  . Valdecoxib Swelling and Other (See Comments) 01/19/2015     ROS:  General: Negative for anorexia, weight loss, fever, chills, fatigue, weakness. ENT: Negative for hoarseness, difficulty swallowing , nasal congestion. CV: Negative for chest pain, angina, palpitations, dyspnea on exertion, peripheral edema.  Respiratory: Negative for dyspnea at rest, dyspnea on exertion, cough, sputum, wheezing.  GI: See history of present illness. GU:  Negative for dysuria, hematuria, urinary incontinence, urinary frequency, nocturnal urination.  Endo: Negative for unusual weight change.    Physical Examination:   There were no vitals taken for this visit.  General: Well-nourished, well-developed in no acute distress.  Eyes: No icterus. Conjunctivae pink. Neuro: Alert and oriented x 3.  Grossly intact. Skin: Warm and dry, no jaundice.   Psych: Alert and cooperative, normal mood and affect.   Imaging Studies: No results found.  Assessment and Plan:   Deborah Obrien is a 79 y.o. y/o female  here to follow-up for gastric polyps which caused severe GI bleed in the past requiring resection. On Eliquis  s/p EGD and resection of multiple gastric polyps. Every time we perform an endoscopy she has severe back pain and chest wall pain. We discussed that moving forward we need to balance the risks of the procedure and the subsequent discomfort she faces versus the benefits of the procedure.   She has severe pain when she lays down due to her back.  I feel at this point of time since her CBC is stable should probably avoid any endoscopy.   Plan 1.  Recheck CBC and iron studies 2.  She would like to start some medications for her acid reflux.  Suggested her to commence on Pepcid over-the-counter.   Dr Jonathon Bellows  MD,MRCP [facility]) Follow up in 6 months telephone visit

## 2020-02-25 ENCOUNTER — Ambulatory Visit (INDEPENDENT_AMBULATORY_CARE_PROVIDER_SITE_OTHER): Payer: Medicare Other | Admitting: *Deleted

## 2020-02-25 DIAGNOSIS — I483 Typical atrial flutter: Secondary | ICD-10-CM

## 2020-02-25 LAB — CUP PACEART REMOTE DEVICE CHECK
Battery Remaining Longevity: 126 mo
Battery Voltage: 2.95 V
Brady Statistic AP VP Percent: 0.03 %
Brady Statistic AP VS Percent: 79.65 %
Brady Statistic AS VP Percent: 0.01 %
Brady Statistic AS VS Percent: 20.31 %
Brady Statistic RA Percent Paced: 79.5 %
Brady Statistic RV Percent Paced: 0.04 %
Date Time Interrogation Session: 20210615013104
Implantable Lead Implant Date: 20161019
Implantable Lead Implant Date: 20181217
Implantable Lead Location: 753859
Implantable Lead Location: 753860
Implantable Lead Model: 5076
Implantable Lead Model: 5076
Implantable Pulse Generator Implant Date: 20181217
Lead Channel Impedance Value: 323 Ohm
Lead Channel Impedance Value: 361 Ohm
Lead Channel Impedance Value: 399 Ohm
Lead Channel Impedance Value: 418 Ohm
Lead Channel Pacing Threshold Amplitude: 0.75 V
Lead Channel Pacing Threshold Amplitude: 0.875 V
Lead Channel Pacing Threshold Pulse Width: 0.4 ms
Lead Channel Pacing Threshold Pulse Width: 0.4 ms
Lead Channel Sensing Intrinsic Amplitude: 4.625 mV
Lead Channel Sensing Intrinsic Amplitude: 4.625 mV
Lead Channel Sensing Intrinsic Amplitude: 5.5 mV
Lead Channel Sensing Intrinsic Amplitude: 5.5 mV
Lead Channel Setting Pacing Amplitude: 2 V
Lead Channel Setting Pacing Amplitude: 2.5 V
Lead Channel Setting Pacing Pulse Width: 0.4 ms
Lead Channel Setting Sensing Sensitivity: 1.2 mV

## 2020-02-26 NOTE — Progress Notes (Signed)
Remote pacemaker transmission.   

## 2020-03-04 ENCOUNTER — Ambulatory Visit: Payer: Medicare Other | Attending: Surgery

## 2020-03-04 ENCOUNTER — Other Ambulatory Visit: Payer: Self-pay

## 2020-03-04 DIAGNOSIS — G8929 Other chronic pain: Secondary | ICD-10-CM | POA: Diagnosis present

## 2020-03-04 DIAGNOSIS — M545 Low back pain, unspecified: Secondary | ICD-10-CM

## 2020-03-04 DIAGNOSIS — R262 Difficulty in walking, not elsewhere classified: Secondary | ICD-10-CM | POA: Insufficient documentation

## 2020-03-04 DIAGNOSIS — M6281 Muscle weakness (generalized): Secondary | ICD-10-CM | POA: Diagnosis present

## 2020-03-04 NOTE — Patient Instructions (Signed)
Access Code: [email or web address] URL: [email or web address] Date: 03/04/2020 Prepared by: Janna Arch  Exercises Seated Posterior Pelvic Tilt - 1 x daily - 7 x weekly - 2 sets - 10 reps - 5 hold Seated Transversus Abdominis Bracing - 1 x daily - 7 x weekly - 2 sets - 10 reps - 5 hold Seated Heel Toe Raises - 1 x daily - 7 x weekly - 2 sets - 10 reps - 5 hold

## 2020-03-04 NOTE — Therapy (Signed)
[place] MAIN [facility] SERVICES [address] [place], [place], [address] Phone: [phone]   Fax:  [phone]  Physical Therapy Evaluation  Patient Details  Name: Deborah Obrien MRN: [id number] Date of Birth: [birth year] Referring Provider (PT): Milagros Evener MD   Encounter Date: 03/04/2020   PT End of Session - 03/04/20 1210    Visit Number 1    Number of Visits 16    Date for PT Re-Evaluation 04/29/20    Authorization Type 1/10 eval 03/04/20    PT Start Time 0945    PT Stop Time 1041    PT Time Calculation (min) 56 min    Equipment Utilized During Treatment Gait belt    Activity Tolerance Patient limited by pain    Behavior During Therapy [facility] for tasks assessed/performed;Anxious           Past Medical History:  Diagnosis Date  . A-fib ([place]) 2016  . Acid reflux   . Anemia   . Anxiety   . Bowel obstruction ([place])   . Carotid artery disease ([place])   . CHF (congestive heart failure) ([place]) 2017  . Chronic kidney disease (CKD) stage G3a/A1, moderately decreased glomerular filtration rate (GFR) between 45-59 mL/min/1.73 square meter and albuminuria creatinine ratio less than 30 mg/g    stage IV  . Coronary artery disease   . Depression   . Diabetes mellitus without complication ([place])   . Dyspnea   . Dysrhythmia    PAROXYSMAL ATRIAL FIBRILLATION  . Fatty liver   . GI bleed   . Gout   . Hypertension   . Hypothyroidism   . Low back pain    history of kyphoplasty t12-l1  . Morbid obesity ([place])   . Multiple gastric polyps    history of gi bleed.  polyps removed  . Neuromuscular disorder ([place])    diabetic neuropathy  . Osteoarthritis   . Osteoporosis   . Paroxysmal atrial fibrillation (HCC)   . Presence of permanent cardiac pacemaker   . Sinus node dysfunction ([place]) 08/24/2017  . Stroke [facility])    TIA, patient unaware that she had it. no residual  . TIA (transient ischemic attack)     Past Surgical History:  Procedure  Laterality Date  . ABDOMINAL HYSTERECTOMY  1980  . APPENDECTOMY    . BACK SURGERY     2008. plate & screws   . BACK SURGERY  2019   patient describes kyphoplasty for compression fractures, MD referral said fusion  . BREAST BIOPSY Right 08/16   stereo fibroadenomatous change, neg for atypia  . BREAST EXCISIONAL BIOPSY Left    neg  . CARDIAC CATHETERIZATION Left 08/25/2016   Procedure: Left Heart Cath and Coronary Angiography;  Surgeon: Corey Skains, MD;  Location: [place] CV LAB;  Service: Cardiovascular;  Laterality: Left;  . CARDIOVERSION N/A 06/28/2017   Procedure: CARDIOVERSION;  Surgeon: Corey Skains, MD;  Location: [facility] ORS;  Service: Cardiovascular;  Laterality: N/A;  . CARDIOVERSION N/A 02/06/2018   Procedure: CARDIOVERSION;  Surgeon: Josue Hector, MD;  Location: [place];  Service: Cardiovascular;  Laterality: N/A;  . CATARACT EXTRACTION, BILATERAL  2012  . CHOLECYSTECTOMY    . colon blockage  1999  . COLON SURGERY  2000   removed 20% of colon. colon had collapsed  . COLONOSCOPY  2016   polyps removed 2016  . DORSAL COMPARTMENT RELEASE Left 09/14/2016   Procedure: RELEASE DORSAL COMPARTMENT (DEQUERVAIN);  Surgeon: Dereck Leep, MD;  Location: [facility] ORS;  Service: Orthopedics;  Laterality: Left;  . ESOPHAGOGASTRODUODENOSCOPY N/A 01/27/2015   Procedure: ESOPHAGOGASTRODUODENOSCOPY (EGD);  Surgeon: Lollie Sails, MD;  Location: [facility] ENDOSCOPY;  Service: Endoscopy;  Laterality: N/A;  . ESOPHAGOGASTRODUODENOSCOPY (EGD) WITH PROPOFOL N/A 07/24/2015   Procedure: ESOPHAGOGASTRODUODENOSCOPY (EGD) WITH PROPOFOL;  Surgeon: Lucilla Lame, MD;  Location: [facility] ENDOSCOPY;  Service: Endoscopy;  Laterality: N/A;  . ESOPHAGOGASTRODUODENOSCOPY (EGD) WITH PROPOFOL N/A 04/12/2018   Procedure: ESOPHAGOGASTRODUODENOSCOPY (EGD) WITH PROPOFOL;  Surgeon: Jonathon Bellows, MD;  Location: [facility] ENDOSCOPY;  Service: Gastroenterology;  Laterality: N/A;  . ESOPHAGOGASTRODUODENOSCOPY (EGD) WITH  PROPOFOL N/A 06/22/2018   Procedure: ESOPHAGOGASTRODUODENOSCOPY (EGD) WITH PROPOFOL;  Surgeon: [name], MD;  Location: [facility] ENDOSCOPY;  Service: Endoscopy;  Laterality: N/A;  . ESOPHAGOGASTRODUODENOSCOPY (EGD) WITH PROPOFOL N/A 07/09/2018   Procedure: ESOPHAGOGASTRODUODENOSCOPY (EGD) WITH PROPOFOL with resection of gastric polyps;  Surgeon: Jonathon Bellows, MD;  Location: [facility] ENDOSCOPY;  Service: Gastroenterology;  Laterality: N/A;  . ESOPHAGOGASTRODUODENOSCOPY (EGD) WITH PROPOFOL N/A 05/17/2019   Procedure: ESOPHAGOGASTRODUODENOSCOPY (EGD) WITH PROPOFOL;  Surgeon: Jonathon Bellows, MD;  Location: [facility] ENDOSCOPY;  Service: Gastroenterology;  Laterality: N/A;  . EUS N/A 06/22/2018   Procedure: UPPER ENDOSCOPIC ULTRASOUND (EUS) RADIAL;  Surgeon: [name], MD;  Location: [facility] ENDOSCOPY;  Service: Endoscopy;  Laterality: N/A;  . EYE SURGERY Bilateral    cataract extractions  . JOINT REPLACEMENT Bilateral 2014   Bilateral Knee replacement  . LEAD REVISION/REPAIR N/A 08/29/2017   Procedure: LEAD REVISION/REPAIR;  Surgeon: Constance Haw, MD;  Location: [place] CV LAB;  Service: Cardiovascular;  Laterality: N/A;  . OOPHORECTOMY    . PACEMAKER INSERTION Left 07/01/2015   Procedure: INSERTION PACEMAKER;  Surgeon: Isaias Cowman, MD;  Location: [facility] ORS;  Service: Cardiovascular;  Laterality: Left;  . PACEMAKER REVISION N/A 08/28/2017   Procedure: PACEMAKER REVISION;  Surgeon: Deboraha Sprang, MD;  Location: [place] CV LAB;  Service: Cardiovascular;  Laterality: N/A;  . REPLACEMENT TOTAL KNEE BILATERAL    . SHOULDER ARTHROSCOPY WITH ROTATOR CUFF REPAIR AND OPEN BICEPS TENODESIS Left 11/21/2019   Procedure: LEFT SHOULDER ARTHROSCOPY WITH DEBRIDEMENT, DECOMPRESSION, ROTATOR CUFF REPAIR AND BICEPS TENOLYSIS;  Surgeon: Corky Mull, MD;  Location: [facility] ORS;  Service: Orthopedics;  Laterality: Left;  . SHOULDER ARTHROSCOPY WITH SUBACROMIAL DECOMPRESSION Left 2013  . TEE WITHOUT  CARDIOVERSION N/A 06/28/2017   Procedure: TRANSESOPHAGEAL ECHOCARDIOGRAM (TEE);  Surgeon: Corey Skains, MD;  Location: [facility] ORS;  Service: Cardiovascular;  Laterality: N/A;    There were no vitals filed for this visit.    Subjective Assessment - 03/04/20 0957    Subjective Patient presents to physical therapy for spinal stenosis lumbar DDD.    Patient is accompained by: Family member    Pertinent History Patient presents to physical therapy for spinal stenosis lumbar DDD. She is primarily confined to a wheelchair when out of her house, when she is in her house she does use a walker for assistance with ambulation. Patient was seen at [facility] for L shoulder due to being s/p surgery. Her back pain began 23,2019  Went to rehab from the hospital after being in ICU for 20 days, was dropped in the rehab facility. Manual chair is primarily use of ambulation, stands and transfers for bathroom. Pain in back is what's stopping from walking. Sleeps in a lift chair because she can't lay flat. PMH of Lumbar spine fusion (L4-S1), kyphoplasty's (T12-L1) , anxiety, a fib s/p cardioversion, CKD, DDD cervical and lumbar, DM type II, gout, panic attacks,  HTN, nephrolithiasis, OA, osteoporosis, overdose on vicoden and oxycodone for back pain, pacemaker, pneumonia, sepsis, stroke,    Limitations Sitting;Lifting;Standing;Walking;House hold activities    How long can you sit comfortably? hour at the limit    How long can you stand comfortably? no more than a minute    How long can you walk comfortably? barely able to take a few steps    Patient Stated Goals to be able to walk, and stand straight and out of pain.    Currently in Pain? Yes    Pain Score 6     Pain Location Back    Pain Orientation Lower    Pain Descriptors / Indicators Aching;Stabbing    Pain Type Chronic pain    Pain Onset More than a month ago    Pain Frequency Constant    Aggravating Factors  standing, laying down, extending spine    Pain  Relieving Factors sitting, pain meds, ice    Effect of Pain on Daily Activities limits all mobility              [facility] PT Assessment - 03/04/20 0001      Assessment   Medical Diagnosis Spinal stenosis lumbar DDD    Referring Provider (PT) Milagros Evener MD    Onset Date/Surgical Date 04/03/18    Hand Dominance Right    Prior Therapy yes   shoulder at [facility]      Precautions   Precautions ICD/Pacemaker;Back    Precaution Comments no lifting more 5-10 lb    Required Braces or Orthoses --   have a back brace but don't wear it      Restrictions   Weight Bearing Restrictions No      Balance Screen   Has the patient fallen in the past 6 months Yes    How many times? 5    Has the patient had a decrease in activity level because of a fear of falling?  Yes    Is the patient reluctant to leave their home because of a fear of falling?  Yes      [place] residence    Living Arrangements Spouse/significant other    Available Help at Discharge Family    Type of [place] One level    [place] - 4 wheels;Shower seat;Grab bars - toilet;Grab bars - tub/shower;Wheelchair - Education administrator (comment)   lift chair     Prior Function   Level of Independence Needs assistance with ADLs   husband helps with dressing    Vocation Retired    Printmaker   Overall Cognitive Status Within Functional Limits for tasks assessed      Observation/Other Assessments   Observations extreme posterior pelvic tilt kyphotic posturing     Focus on Therapeutic Outcomes (FOTO)  36/100    Other Surveys  Modified Oswestry    Modified Oswestry 58%      Observation/Other Assessments-Edema    Edema --   bilateral LE edema     Sensation   Light Touch Impaired by gross assessment;Impaired Detail    Light Touch Impaired Details Impaired LLE    Proprioception Impaired by gross assessment       Coordination   Gross Motor Movements are Fluid and Coordinated No    Coordination and Movement Description pain limits movements.     Heel Shin Test unable  to attempt due to pain limiting LE movement      Functional Tests   Functional tests Sit to Stand      Sit to Stand   Comments very painful, heavy reliance upon UE's, excessive trunk flexion       Posture/Postural Control   Posture/Postural Control Postural limitations    Postural Limitations Rounded Shoulders;Forward head;Decreased lumbar lordosis;Increased thoracic kyphosis;Posterior pelvic tilt;Flexed trunk;Weight shift right      ROM / Strength   AROM / PROM / Strength --      Flexibility   Soft Tissue Assessment /Muscle Length yes    Quadriceps limited iliopsoas and hip flexor     Quadratus Lumborum limited      Palpation   Spinal mobility unable to tolerate palpation     SI assessment  unable to tolerate palpation              PAIN: Worst: 10/10 when walking.  Least amount of pain: 4/10 with medication Current pain: 6/10 stabbing pain, burning     POSTURE: Posterior pelvic tilt   PROM/AROM:   PROM BLE: unable to assess due to positional restrictions AROM BLE: hip; flexion limited by pain, abduction and adduction functional but limited, hip extension limited: unable to obtain neutral by -9 degrees; knee WFL, ankle df L-3 degrees  Trunk Flexion Resting position is in trunk flexion, able to bend ~25% more from resting position   Trunk Extension Unable to obtain neutral position (~ - 25%)   Trunk R SB Unable to attempt due to pain  Trunk L SB Unable to attempt due to pain  Trunk R rotation Unable to attempt due to pain  Trunk L rotation Unable to attempt due to pain     STRENGTH:  Graded on a 0-5 scale Muscle Group Left Right  Hip Flex 2+/5* 3/5  Hip Abd 3-/5 3/5  Hip Add 3-/5 3/5  Hip Ext 2/5* 2/5*      Knee Flex 2+/5* 3/5  Knee Ext 3/5* 3/5  Ankle DF 2+/5* 3/5  Ankle PF 3/5* 3/5  *  pain     SPECIAL TESTS: Unable to perform special tests due to positional restrictions and high pain levels.   FUNCTIONAL MOBILITY: Bed mobility: patient unable to tolerate supine or prone position  Sit to stand: heavy reliance upon UE's with RW , extreme forward trunk posture Stand pivot transfer to/from chair: slow; painful patient reports increase of pain to 9/10, limited weight acceptance on LLE.   BALANCE: Dynamic Sitting Balance  Normal Able to sit unsupported and weight shift across midline maximally   Good Able to sit unsupported and weight shift across midline moderately   Good-/Fair+ Able to sit unsupported and weight shift across midline minimally   Fair Minimal weight shifting ipsilateral/front, difficulty crossing midline   Fair- Reach to ipsilateral side and unable to weight shift   Poor + Able to sit unsupported with min A and reach to ipsilateral side, unable to weight shift x  Poor Able to sit unsupported with mod A and reach ipsilateral/front-can't cross midline     Standing Dynamic Balance  Normal Stand independently unsupported, able to weight shift and cross midline maximally   Good Stand independently unsupported, able to weight shift and cross midline moderately   Good-/Fair+ Stand independently unsupported, able to weight shift across midline minimally   Fair Stand independently unsupported, weight shift, and reach ipsilaterally, loss of balance when crossing midline   Poor+ Able to stand with Min  A and reach ipsilaterally, unable to weight shift   Poor Able to stand with Mod A and minimally reach ipsilaterally, unable to cross midline. x    Static Sitting Balance  Normal Able to maintain balance against maximal resistance   Good Able to maintain balance against moderate resistance   Good-/Fair+ Accepts minimal resistance   Fair Able to sit unsupported without balance loss and without UE support   Poor+ Able to maintain with Minimal assistance from  individual or chair x  Poor Unable to maintain balance-requires mod/max support from individual or chair     Static Standing Balance  Normal Able to maintain standing balance against maximal resistance   Good Able to maintain standing balance against moderate resistance   Good-/Fair+ Able to maintain standing balance against minimal resistance   Fair Able to stand unsupported without UE support and without LOB for 1-2 min   Fair- Requires Min A and UE support to maintain standing without loss of balance x  Poor+ Requires mod A and UE support to maintain standing without loss of balance   Poor Requires max A and UE support to maintain standing balance without loss     GAIT: Unable to tolerate ambulation during evaluation  OUTCOME MEASURES: MODI 58%   FOTO 36%  Discharge recommendation of 41/100                       Access Code: [email or web address] URL: [email or web address] Date: 03/04/2020 Prepared by: Janna Arch  Exercises Seated Posterior Pelvic Tilt - 1 x daily - 7 x weekly - 2 sets - 10 reps - 5 hold Seated Transversus Abdominis Bracing - 1 x daily - 7 x weekly - 2 sets - 10 reps - 5 hold Seated Heel Toe Raises - 1 x daily - 7 x weekly - 2 sets - 10 reps - 5 hold     Objective measurements completed on examination: See above findings.               PT Education - 03/04/20 1209    Education Details goals, POC, HEP    Person(s) Educated Patient;Spouse    Methods Explanation;Demonstration;Tactile cues;Verbal cues;Handout    Comprehension Verbalized understanding;Returned demonstration;Verbal cues required;Tactile cues required            PT Short Term Goals - 03/04/20 1224      PT SHORT TERM GOAL #1   Title Patient will be independent in home exercise program to improve strength/mobility for better functional independence with ADLs.    Baseline 6/23: HEP given    Time 4    Period Weeks    Status New    Target Date 04/01/20              PT Long Term Goals - 03/04/20 1225      PT LONG TERM GOAL #1   Title Patient will increase FOTO score to equal to or greater than  41%   to demonstrate statistically significant improvement in mobility and quality of life.    Baseline 6/23: 36%    Time 8    Period Weeks    Status New    Target Date 04/29/20      PT LONG TERM GOAL #2   Title Patient will report a worst pain of 5/10 on VAS in low back  to improve tolerance with ADLs and reduced symptoms with activities.    Baseline 6/23: 10/10    Time 8    Period  Weeks    Status New    Target Date 04/29/20      PT LONG TERM GOAL #3   Title Patient will reduce modified Oswestry score to <20 as to demonstrate minimal disability with ADLs including improved sleeping tolerance, walking/sitting tolerance etc for better mobility with ADLs.    Baseline 6/23: 58%    Time 8    Status New    Target Date 04/29/20      PT LONG TERM GOAL #4   Title Patient will be able to ambulate 40 ft with RW without LOB with LRAD for functional mobility within home.    Baseline 6/23: unable to ambulate    Time 8    Period Weeks    Status New    Target Date 04/29/20                  Plan - 03/04/20 1218    Clinical Impression Statement Patient is a very pleasant 79 year old female who presents with debilitating low back pain. Patient is limited in all positions and is unable to tolerate supine and prone. She is limited in her seated position without back support limiting full evaluation. Her MODI was 58% and FOTO 36%. Patient is very tender to palpation and has significant muscle tissue guarding of left paraspinals with LLE affected more than RLE. She is no longer able to ambulate due to significant pain with standing. Patient would benefit from skilled physical therapy to reduce pain, improve mobility, and increase quality of life.    Personal Factors and Comorbidities Comorbidity 3+;Finances;Fitness;Past/Current Experience;Time since onset  of injury/illness/exacerbation;Transportation    Comorbidities Lumbar spine fusion (L4-S1), kyphoplasty's (T12-L1) , anxiety, a fib s/p cardioversion, CKD, DDD cervical and lumbar, DM type II, gout, panic attacks, HTN, nephrolithiasis, OA, osteoporosis, overdose on vicoden and oxycodone for back pain, pacemaker, pneumonia, sepsis, stroke,    Examination-Activity Limitations Bathing;Bed Mobility;Bend;Caring for Others;Hygiene/Grooming;Dressing;Continence;Carry;Lift;Locomotion Level;Reach Overhead;Sit;Squat;Stairs;Stand;Toileting;Transfers    Examination-Participation Restrictions Church;Cleaning;Community Activity;Driving;Interpersonal Relationship;Personal Finances;Laundry;Shop;Volunteer;Yard Work;Meal Prep    Stability/Clinical Decision Making Unstable/Unpredictable    Clinical Decision Making High    Rehab Potential Fair    PT Frequency 2x / week    PT Duration 8 weeks    PT Treatment/Interventions ADLs/Self Care Home Management;Aquatic Therapy;Biofeedback;Cryotherapy;Moist Heat;Traction;Functional mobility training;Stair training;Gait training;DME Instruction;Therapeutic activities;Therapeutic exercise;Balance training;Neuromuscular re-education;Manual techniques;Wheelchair mobility training;Patient/family education;Compression bandaging;Scar mobilization;Passive range of motion;Dry needling;Vasopneumatic Device;Taping;Energy conservation    PT Next Visit Plan pain reduction techniques, core activation, LE strength    PT Home Exercise Plan see above    Consulted and Agree with Plan of Care Patient           Patient will benefit from skilled therapeutic intervention in order to improve the following deficits and impairments:  Abnormal gait, Cardiopulmonary status limiting activity, Decreased activity tolerance, Decreased balance, Decreased endurance, Decreased coordination, Decreased mobility, Decreased range of motion, Decreased safety awareness, Difficulty walking, Decreased strength,  Hypomobility, Increased edema, Impaired flexibility, Impaired perceived functional ability, Increased muscle spasms, Impaired sensation, Impaired UE functional use, Obesity, Postural dysfunction, Improper body mechanics, Pain  Visit Diagnosis: Chronic bilateral low back pain, unspecified whether sciatica present  Difficulty in walking, not elsewhere classified  Muscle weakness (generalized)     Problem List Patient Active Problem List   Diagnosis Date Noted  . Osteoporosis 02/13/2020  . Primary osteoarthritis of left shoulder 11/10/2019  . Tendinitis of upper biceps tendon of left shoulder 11/10/2019  . Uses walker 09/27/2018  . RUQ pain   . UTI (urinary  tract infection) 06/14/2018  . Type 2 diabetes mellitus with neurological complications ([place]) 26/94/8546  . Acute respiratory failure ([place]) 04/09/2018  . Chronic anticoagulation 04/09/2018  . Pacemaker 04/09/2018  . H/O TIA (transient ischemic attack) and stroke 04/09/2018  . CAD (coronary artery disease) 04/09/2018  . Hematemesis 04/05/2018  . Hypotension 02/04/2018  . Atrial fibrillation with RVR ([place]) 02/04/2018  . Healthcare maintenance 09/22/2017  . Atrial flutter ([place]) 08/24/2017  . Sinus node dysfunction ([place]) 08/24/2017  . SOBOE (shortness of breath on exertion) 06/26/2017  . Dizziness 06/12/2017  . Secondary hyperparathyroidism of renal origin ([place]) 05/09/2017  . Mastalgia 02/14/2017  . Acute on chronic renal insufficiency 01/09/2017  . Fatty liver 01/09/2017  . Anemia in stage 3 chronic kidney disease 11/02/2016  . Iron deficiency anemia 11/02/2016  . Benign essential HTN 09/08/2016  . Nephrolithiasis 02/22/2016  . Diabetic neuropathy with neurologic complication ([place]) 27/11/5007  . Moderate episode of recurrent major depressive disorder ([place]) 08/11/2015  . Recurrent major depressive disorder, in remission ([place]) 08/11/2015  . Polyp of stomach and duodenum 07/29/2015  . Gastritis without bleeding   . Gastric  polyp   . Gastro-esophageal reflux disease without esophagitis   . PAF (paroxysmal atrial fibrillation) ([place]) 06/29/2015  . Intestinal obstruction ([place]) 02/20/2015  . DDD (degenerative disc disease), lumbar 01/19/2015  . Back pain 01/12/2015  . Gout 01/12/2015  . Other specified postprocedural states 11/04/2014  . Complete tear of left rotator cuff 10/16/2014  . Intervertebral disc disorder with radiculopathy of lumbar region 09/17/2014  . Lumbar radiculitis 09/17/2014  . Lumbar stenosis with neurogenic claudication 09/17/2014  . Neuritis or radiculitis due to rupture of lumbar intervertebral disc 09/17/2014  . Atherosclerosis of both carotid arteries 07/15/2014  . Essential hypertension 07/15/2014  . Hyperlipemia, mixed 07/15/2014  . 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 Body mass index (BMI) of 40.0-44.9 in adult ([place]) 06/21/2014  . Mixed incontinence 06/25/2013  . Mixed urge and stress incontinence 06/25/2013  . Nocturia 06/25/2013  . Blurring of visual image 04/11/2012  . Other visual disturbances 04/11/2012   Janna Arch, PT, DPT   03/04/2020, 6:03 PM  [place] MAIN [facility] SERVICES [address] [place], [place], [address] Phone: [phone]   Fax:  [phone]  Name: Deborah Obrien MRN: [id number] Date of Birth: 1941/08/22

## 2020-03-09 ENCOUNTER — Ambulatory Visit: Payer: Medicare Other

## 2020-03-09 ENCOUNTER — Other Ambulatory Visit: Payer: Self-pay

## 2020-03-09 DIAGNOSIS — G8929 Other chronic pain: Secondary | ICD-10-CM

## 2020-03-09 DIAGNOSIS — M545 Low back pain: Secondary | ICD-10-CM | POA: Diagnosis not present

## 2020-03-09 DIAGNOSIS — R262 Difficulty in walking, not elsewhere classified: Secondary | ICD-10-CM

## 2020-03-09 DIAGNOSIS — M6281 Muscle weakness (generalized): Secondary | ICD-10-CM

## 2020-03-09 NOTE — Therapy (Signed)
[place] MAIN [facility] SERVICES [address] [place], [place], [address] Phone: [phone]   Fax:  [phone]  Physical Therapy Treatment  Patient Details  Name: Deborah Obrien MRN: [id number] Date of Birth: 10-08-1940 Referring Provider (PT): Milagros Evener MD   Encounter Date: 03/09/2020   PT End of Session - 03/09/20 1444    Visit Number 2    Number of Visits 16    Date for PT Re-Evaluation 04/29/20    Authorization Type 2/10 eval 03/04/20    PT Start Time 0930    PT Stop Time 1013    PT Time Calculation (min) 43 min    Equipment Utilized During Treatment Gait belt    Activity Tolerance Patient limited by pain;Patient tolerated treatment well    Behavior During Therapy [facility] for tasks assessed/performed;Anxious           Past Medical History:  Diagnosis Date  . A-fib ([place]) 2016  . Acid reflux   . Anemia   . Anxiety   . Bowel obstruction ([place])   . Carotid artery disease ([place])   . CHF (congestive heart failure) ([place]) 2017  . Chronic kidney disease (CKD) stage G3a/A1, moderately decreased glomerular filtration rate (GFR) between 45-59 mL/min/1.73 square meter and albuminuria creatinine ratio less than 30 mg/g    stage IV  . Coronary artery disease   . Depression   . Diabetes mellitus without complication ([place])   . Dyspnea   . Dysrhythmia    PAROXYSMAL ATRIAL FIBRILLATION  . Fatty liver   . GI bleed   . Gout   . Hypertension   . Hypothyroidism   . Low back pain    history of kyphoplasty t12-l1  . Morbid obesity ([place])   . Multiple gastric polyps    history of gi bleed.  polyps removed  . Neuromuscular disorder ([place])    diabetic neuropathy  . Osteoarthritis   . Osteoporosis   . Paroxysmal atrial fibrillation (HCC)   . Presence of permanent cardiac pacemaker   . Sinus node dysfunction ([place]) 08/24/2017  . Stroke [facility])    TIA, patient unaware that she had it. no residual  . TIA (transient ischemic attack)     Past  Surgical History:  Procedure Laterality Date  . ABDOMINAL HYSTERECTOMY  1980  . APPENDECTOMY    . BACK SURGERY     2008. plate & screws   . BACK SURGERY  2019   patient describes kyphoplasty for compression fractures, MD referral said fusion  . BREAST BIOPSY Right 08/16   stereo fibroadenomatous change, neg for atypia  . BREAST EXCISIONAL BIOPSY Left    neg  . CARDIAC CATHETERIZATION Left 08/25/2016   Procedure: Left Heart Cath and Coronary Angiography;  Surgeon: Corey Skains, MD;  Location: [place] CV LAB;  Service: Cardiovascular;  Laterality: Left;  . CARDIOVERSION N/A 06/28/2017   Procedure: CARDIOVERSION;  Surgeon: Corey Skains, MD;  Location: [facility] ORS;  Service: Cardiovascular;  Laterality: N/A;  . CARDIOVERSION N/A 02/06/2018   Procedure: CARDIOVERSION;  Surgeon: Josue Hector, MD;  Location: [place];  Service: Cardiovascular;  Laterality: N/A;  . CATARACT EXTRACTION, BILATERAL  2012  . CHOLECYSTECTOMY    . colon blockage  1999  . COLON SURGERY  2000   removed 20% of colon. colon had collapsed  . COLONOSCOPY  2016   polyps removed 2016  . DORSAL COMPARTMENT RELEASE Left 09/14/2016   Procedure: RELEASE DORSAL COMPARTMENT (DEQUERVAIN);  Surgeon: Jeneen Rinks  Vira Blanco, MD;  Location: [facility] ORS;  Service: Orthopedics;  Laterality: Left;  . ESOPHAGOGASTRODUODENOSCOPY N/A 01/27/2015   Procedure: ESOPHAGOGASTRODUODENOSCOPY (EGD);  Surgeon: Lollie Sails, MD;  Location: [facility] ENDOSCOPY;  Service: Endoscopy;  Laterality: N/A;  . ESOPHAGOGASTRODUODENOSCOPY (EGD) WITH PROPOFOL N/A 07/24/2015   Procedure: ESOPHAGOGASTRODUODENOSCOPY (EGD) WITH PROPOFOL;  Surgeon: Lucilla Lame, MD;  Location: [facility] ENDOSCOPY;  Service: Endoscopy;  Laterality: N/A;  . ESOPHAGOGASTRODUODENOSCOPY (EGD) WITH PROPOFOL N/A 04/12/2018   Procedure: ESOPHAGOGASTRODUODENOSCOPY (EGD) WITH PROPOFOL;  Surgeon: Jonathon Bellows, MD;  Location: [facility] ENDOSCOPY;  Service: Gastroenterology;  Laterality: N/A;  .  ESOPHAGOGASTRODUODENOSCOPY (EGD) WITH PROPOFOL N/A 06/22/2018   Procedure: ESOPHAGOGASTRODUODENOSCOPY (EGD) WITH PROPOFOL;  Surgeon: [name], MD;  Location: [facility] ENDOSCOPY;  Service: Endoscopy;  Laterality: N/A;  . ESOPHAGOGASTRODUODENOSCOPY (EGD) WITH PROPOFOL N/A 07/09/2018   Procedure: ESOPHAGOGASTRODUODENOSCOPY (EGD) WITH PROPOFOL with resection of gastric polyps;  Surgeon: Jonathon Bellows, MD;  Location: [facility] ENDOSCOPY;  Service: Gastroenterology;  Laterality: N/A;  . ESOPHAGOGASTRODUODENOSCOPY (EGD) WITH PROPOFOL N/A 05/17/2019   Procedure: ESOPHAGOGASTRODUODENOSCOPY (EGD) WITH PROPOFOL;  Surgeon: Jonathon Bellows, MD;  Location: [facility] ENDOSCOPY;  Service: Gastroenterology;  Laterality: N/A;  . EUS N/A 06/22/2018   Procedure: UPPER ENDOSCOPIC ULTRASOUND (EUS) RADIAL;  Surgeon: [name], MD;  Location: [facility] ENDOSCOPY;  Service: Endoscopy;  Laterality: N/A;  . EYE SURGERY Bilateral    cataract extractions  . JOINT REPLACEMENT Bilateral 2014   Bilateral Knee replacement  . LEAD REVISION/REPAIR N/A 08/29/2017   Procedure: LEAD REVISION/REPAIR;  Surgeon: Constance Haw, MD;  Location: [place] CV LAB;  Service: Cardiovascular;  Laterality: N/A;  . OOPHORECTOMY    . PACEMAKER INSERTION Left 07/01/2015   Procedure: INSERTION PACEMAKER;  Surgeon: Isaias Cowman, MD;  Location: [facility] ORS;  Service: Cardiovascular;  Laterality: Left;  . PACEMAKER REVISION N/A 08/28/2017   Procedure: PACEMAKER REVISION;  Surgeon: Deboraha Sprang, MD;  Location: [place] CV LAB;  Service: Cardiovascular;  Laterality: N/A;  . REPLACEMENT TOTAL KNEE BILATERAL    . SHOULDER ARTHROSCOPY WITH ROTATOR CUFF REPAIR AND OPEN BICEPS TENODESIS Left 11/21/2019   Procedure: LEFT SHOULDER ARTHROSCOPY WITH DEBRIDEMENT, DECOMPRESSION, ROTATOR CUFF REPAIR AND BICEPS TENOLYSIS;  Surgeon: Corky Mull, MD;  Location: [facility] ORS;  Service: Orthopedics;  Laterality: Left;  . SHOULDER ARTHROSCOPY WITH SUBACROMIAL  DECOMPRESSION Left 2013  . TEE WITHOUT CARDIOVERSION N/A 06/28/2017   Procedure: TRANSESOPHAGEAL ECHOCARDIOGRAM (TEE);  Surgeon: Corey Skains, MD;  Location: [facility] ORS;  Service: Cardiovascular;  Laterality: N/A;    There were no vitals filed for this visit.   Subjective Assessment - 03/09/20 1443    Subjective Patient presents with husband to PT, reports she has done her HEP but is painful. Took a painpill prior to session    Patient is accompained by: Family member    Pertinent History Patient presents to physical therapy for spinal stenosis lumbar DDD. She is primarily confined to a wheelchair when out of her house, when she is in her house she does use a walker for assistance with ambulation. Patient was seen at [facility] for L shoulder due to being s/p surgery. Her back pain began 23,2019  Went to rehab from the hospital after being in ICU for 20 days, was dropped in the rehab facility. Manual chair is primarily use of ambulation, stands and transfers for bathroom. Pain in back is what's stopping from walking. Sleeps in a lift chair because she can't lay flat. PMH of Lumbar spine fusion (L4-S1), kyphoplasty's (T12-L1) , anxiety, a  fib s/p cardioversion, CKD, DDD cervical and lumbar, DM type II, gout, panic attacks, HTN, nephrolithiasis, OA, osteoporosis, overdose on vicoden and oxycodone for back pain, pacemaker, pneumonia, sepsis, stroke,    Limitations Sitting;Lifting;Standing;Walking;House hold activities    How long can you sit comfortably? hour at the limit    How long can you stand comfortably? no more than a minute    How long can you walk comfortably? barely able to take a few steps    Patient Stated Goals to be able to walk, and stand straight and out of pain.    Currently in Pain? Yes    Pain Score 6     Pain Location Back    Pain Orientation Lower    Pain Descriptors / Indicators Aching;Stabbing    Pain Type Chronic pain    Pain Onset More than a month ago    Pain Frequency  Constant                 Treatment:  Transfer from transport chair to plinth table positioned into recline position: Mod A   recliner position plinth table:  -Heel pumps 15x each LE; patient reports burning up into her back with repetition  -single limb LAQ one LE at a time, 10x  -Adduction ball squeeze 10x 3 second holds -Posterior pelvic tilt; mod-max cueing for sequencing for reduction of thrust and introduction to slow controlled movement 10x  -TrA contraction against swiss ball with swiss ball between knees and hands 2x 10 -scapular retractions 10x with focus on upright posture  Seated edge of mat table: STM to left lumbar  Paraspinals and quadratus, implementation of effleurage and ptrissage for reduction of muscle tissue guarding and contraction; patient reports reduction of pain with repeated performance x 13 minutes    Pt educated throughout session about proper posture and technique with exercises. Improved exercise technique, movement at target joints, use of target muscles after min to mod verbal, visual, tactile cues.                  PT Education - 03/09/20 1444    Education Details exercise technique, body mechanics, manual    Person(s) Educated Patient    Methods Explanation;Demonstration;Tactile cues;Verbal cues    Comprehension Verbalized understanding;Returned demonstration;Verbal cues required;Tactile cues required            PT Short Term Goals - 03/04/20 1224      PT SHORT TERM GOAL #1   Title Patient will be independent in home exercise program to improve strength/mobility for better functional independence with ADLs.    Baseline 6/23: HEP given    Time 4    Period Weeks    Status New    Target Date 04/01/20             PT Long Term Goals - 03/04/20 1225      PT LONG TERM GOAL #1   Title Patient will increase FOTO score to equal to or greater than  41%   to demonstrate statistically significant improvement in mobility  and quality of life.    Baseline 6/23: 36%    Time 8    Period Weeks    Status New    Target Date 04/29/20      PT LONG TERM GOAL #2   Title Patient will report a worst pain of 5/10 on VAS in low back  to improve tolerance with ADLs and reduced symptoms with activities.    Baseline 6/23: 10/10  Time 8    Period Weeks    Status New    Target Date 04/29/20      PT LONG TERM GOAL #3   Title Patient will reduce modified Oswestry score to <20 as to demonstrate minimal disability with ADLs including improved sleeping tolerance, walking/sitting tolerance etc for better mobility with ADLs.    Baseline 6/23: 58%    Time 8    Status New    Target Date 04/29/20      PT LONG TERM GOAL #4   Title Patient will be able to ambulate 40 ft with RW without LOB with LRAD for functional mobility within home.    Baseline 6/23: unable to ambulate    Time 8    Period Weeks    Status New    Target Date 04/29/20                 Plan - 03/09/20 1449    Clinical Impression Statement Patient presents to physical therapy with excellent motivation. She requires education on breathing technique for reduction of valsalva as well as slow controlled muscle contraction for reduction of spasm. Reports pain reduction by end of session. Patient would benefit from skilled physical therapy to reduce pain, improve mobility, and increase quality of life.    Personal Factors and Comorbidities Comorbidity 3+;Finances;Fitness;Past/Current Experience;Time since onset of injury/illness/exacerbation;Transportation    Comorbidities Lumbar spine fusion (L4-S1), kyphoplasty's (T12-L1) , anxiety, a fib s/p cardioversion, CKD, DDD cervical and lumbar, DM type II, gout, panic attacks, HTN, nephrolithiasis, OA, osteoporosis, overdose on vicoden and oxycodone for back pain, pacemaker, pneumonia, sepsis, stroke,    Examination-Activity Limitations Bathing;Bed Mobility;Bend;Caring for  Others;Hygiene/Grooming;Dressing;Continence;Carry;Lift;Locomotion Level;Reach Overhead;Sit;Squat;Stairs;Stand;Toileting;Transfers    Examination-Participation Restrictions Church;Cleaning;Community Activity;Driving;Interpersonal Relationship;Personal Finances;Laundry;Shop;Volunteer;Yard Work;Meal Prep    Stability/Clinical Decision Making Unstable/Unpredictable    Rehab Potential Fair    PT Frequency 2x / week    PT Duration 8 weeks    PT Treatment/Interventions ADLs/Self Care Home Management;Aquatic Therapy;Biofeedback;Cryotherapy;Moist Heat;Traction;Functional mobility training;Stair training;Gait training;DME Instruction;Therapeutic activities;Therapeutic exercise;Balance training;Neuromuscular re-education;Manual techniques;Wheelchair mobility training;Patient/family education;Compression bandaging;Scar mobilization;Passive range of motion;Dry needling;Vasopneumatic Device;Taping;Energy conservation    PT Next Visit Plan pain reduction techniques, core activation, LE strength    PT Home Exercise Plan see above    Consulted and Agree with Plan of Care Patient           Patient will benefit from skilled therapeutic intervention in order to improve the following deficits and impairments:  Abnormal gait, Cardiopulmonary status limiting activity, Decreased activity tolerance, Decreased balance, Decreased endurance, Decreased coordination, Decreased mobility, Decreased range of motion, Decreased safety awareness, Difficulty walking, Decreased strength, Hypomobility, Increased edema, Impaired flexibility, Impaired perceived functional ability, Increased muscle spasms, Impaired sensation, Impaired UE functional use, Obesity, Postural dysfunction, Improper body mechanics, Pain  Visit Diagnosis: Chronic bilateral low back pain, unspecified whether sciatica present  Difficulty in walking, not elsewhere classified  Muscle weakness (generalized)     Problem List Patient Active Problem List    Diagnosis Date Noted  . Osteoporosis 02/13/2020  . Primary osteoarthritis of left shoulder 11/10/2019  . Tendinitis of upper biceps tendon of left shoulder 11/10/2019  . Uses walker 09/27/2018  . RUQ pain   . UTI (urinary tract infection) 06/14/2018  . Type 2 diabetes mellitus with neurological complications ([place]) 40/34/7425  . Acute respiratory failure ([place]) 04/09/2018  . Chronic anticoagulation 04/09/2018  . Pacemaker 04/09/2018  . H/O TIA (transient ischemic attack) and stroke 04/09/2018  . CAD (coronary artery disease) 04/09/2018  . Hematemesis  04/05/2018  . Hypotension 02/04/2018  . Atrial fibrillation with RVR ([place]) 02/04/2018  . Healthcare maintenance 09/22/2017  . Atrial flutter ([place]) 08/24/2017  . Sinus node dysfunction ([place]) 08/24/2017  . SOBOE (shortness of breath on exertion) 06/26/2017  . Dizziness 06/12/2017  . Secondary hyperparathyroidism of renal origin ([place]) 05/09/2017  . Mastalgia 02/14/2017  . Acute on chronic renal insufficiency 01/09/2017  . Fatty liver 01/09/2017  . Anemia in stage 3 chronic kidney disease 11/02/2016  . Iron deficiency anemia 11/02/2016  . Benign essential HTN 09/08/2016  . Nephrolithiasis 02/22/2016  . Diabetic neuropathy with neurologic complication ([place]) 29/10/1113  . Moderate episode of recurrent major depressive disorder ([place]) 08/11/2015  . Recurrent major depressive disorder, in remission ([place]) 08/11/2015  . Polyp of stomach and duodenum 07/29/2015  . Gastritis without bleeding   . Gastric polyp   . Gastro-esophageal reflux disease without esophagitis   . PAF (paroxysmal atrial fibrillation) ([place]) 06/29/2015  . Intestinal obstruction ([place]) 02/20/2015  . DDD (degenerative disc disease), lumbar 01/19/2015  . Back pain 01/12/2015  . Gout 01/12/2015  . Other specified postprocedural states 11/04/2014  . Complete tear of left rotator cuff 10/16/2014  . Intervertebral disc disorder with radiculopathy of lumbar region 09/17/2014  .  Lumbar radiculitis 09/17/2014  . Lumbar stenosis with neurogenic claudication 09/17/2014  . Neuritis or radiculitis due to rupture of lumbar intervertebral disc 09/17/2014  . Atherosclerosis of both carotid arteries 07/15/2014  . Essential hypertension 07/15/2014  . Hyperlipemia, mixed 07/15/2014  . 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 Body mass index (BMI) of 40.0-44.9 in adult ([place]) 06/21/2014  . Mixed incontinence 06/25/2013  . Mixed urge and stress incontinence 06/25/2013  . Nocturia 06/25/2013  . Blurring of visual image 04/11/2012  . Other visual disturbances 04/11/2012   Janna Arch, PT, DPT   03/09/2020, 2:50 PM  [place] MAIN [facility] SERVICES [address] [place], [place], [address] Phone: [phone]   Fax:  [phone]  Name: KAISEY HUSEBY MRN: [id number] Date of Birth: 07-09-1941

## 2020-03-11 ENCOUNTER — Ambulatory Visit: Payer: Medicare Other

## 2020-03-11 ENCOUNTER — Ambulatory Visit (INDEPENDENT_AMBULATORY_CARE_PROVIDER_SITE_OTHER): Payer: Medicare Other

## 2020-03-11 ENCOUNTER — Other Ambulatory Visit: Payer: Self-pay

## 2020-03-11 DIAGNOSIS — I4891 Unspecified atrial fibrillation: Secondary | ICD-10-CM

## 2020-03-11 DIAGNOSIS — M6281 Muscle weakness (generalized): Secondary | ICD-10-CM

## 2020-03-11 DIAGNOSIS — Z8673 Personal history of transient ischemic attack (TIA), and cerebral infarction without residual deficits: Secondary | ICD-10-CM

## 2020-03-11 DIAGNOSIS — M545 Low back pain, unspecified: Secondary | ICD-10-CM

## 2020-03-11 DIAGNOSIS — Z5181 Encounter for therapeutic drug level monitoring: Secondary | ICD-10-CM | POA: Diagnosis not present

## 2020-03-11 DIAGNOSIS — R262 Difficulty in walking, not elsewhere classified: Secondary | ICD-10-CM

## 2020-03-11 DIAGNOSIS — I480 Paroxysmal atrial fibrillation: Secondary | ICD-10-CM | POA: Diagnosis not present

## 2020-03-11 LAB — POCT INR: INR: 4.2 — AB (ref 2.0–3.0)

## 2020-03-11 MED ORDER — WARFARIN SODIUM 5 MG PO TABS: ORAL_TABLET | ORAL | 1 refills | Status: DC

## 2020-03-11 NOTE — Therapy (Signed)
[place] MAIN [facility] SERVICES [address] [place], [place], [address] Phone: [phone]   Fax:  [phone]  Physical Therapy Treatment  Patient Details  Name: Deborah Obrien MRN: [id number] Date of Birth: [birth year] Referring Provider (PT): Milagros Evener MD   Encounter Date: 03/11/2020   PT End of Session - 03/11/20 0937    Visit Number 3    Number of Visits 16    Date for PT Re-Evaluation 04/29/20    Authorization Type 3/10 eval 03/04/20    PT Start Time 0944    PT Stop Time 1028    PT Time Calculation (min) 44 min    Equipment Utilized During Treatment Gait belt    Activity Tolerance Patient limited by pain;Patient tolerated treatment well    Behavior During Therapy [facility] for tasks assessed/performed;Anxious           Past Medical History:  Diagnosis Date  . A-fib ([place]) 2016  . Acid reflux   . Anemia   . Anxiety   . Bowel obstruction ([place])   . Carotid artery disease ([place])   . CHF (congestive heart failure) ([place]) 2017  . Chronic kidney disease (CKD) stage G3a/A1, moderately decreased glomerular filtration rate (GFR) between 45-59 mL/min/1.73 square meter and albuminuria creatinine ratio less than 30 mg/g    stage IV  . Coronary artery disease   . Depression   . Diabetes mellitus without complication ([place])   . Dyspnea   . Dysrhythmia    PAROXYSMAL ATRIAL FIBRILLATION  . Fatty liver   . GI bleed   . Gout   . Hypertension   . Hypothyroidism   . Low back pain    history of kyphoplasty t12-l1  . Morbid obesity ([place])   . Multiple gastric polyps    history of gi bleed.  polyps removed  . Neuromuscular disorder ([place])    diabetic neuropathy  . Osteoarthritis   . Osteoporosis   . Paroxysmal atrial fibrillation (HCC)   . Presence of permanent cardiac pacemaker   . Sinus node dysfunction ([place]) 08/24/2017  . Stroke [facility])    TIA, patient unaware that she had it. no residual  . TIA (transient ischemic attack)     Past  Surgical History:  Procedure Laterality Date  . ABDOMINAL HYSTERECTOMY  1980  . APPENDECTOMY    . BACK SURGERY     2008. plate & screws   . BACK SURGERY  2019   patient describes kyphoplasty for compression fractures, MD referral said fusion  . BREAST BIOPSY Right 08/16   stereo fibroadenomatous change, neg for atypia  . BREAST EXCISIONAL BIOPSY Left    neg  . CARDIAC CATHETERIZATION Left 08/25/2016   Procedure: Left Heart Cath and Coronary Angiography;  Surgeon: Corey Skains, MD;  Location: [place] CV LAB;  Service: Cardiovascular;  Laterality: Left;  . CARDIOVERSION N/A 06/28/2017   Procedure: CARDIOVERSION;  Surgeon: Corey Skains, MD;  Location: [facility] ORS;  Service: Cardiovascular;  Laterality: N/A;  . CARDIOVERSION N/A 02/06/2018   Procedure: CARDIOVERSION;  Surgeon: Josue Hector, MD;  Location: [place];  Service: Cardiovascular;  Laterality: N/A;  . CATARACT EXTRACTION, BILATERAL  2012  . CHOLECYSTECTOMY    . colon blockage  1999  . COLON SURGERY  2000   removed 20% of colon. colon had collapsed  . COLONOSCOPY  2016   polyps removed 2016  . DORSAL COMPARTMENT RELEASE Left 09/14/2016   Procedure: RELEASE DORSAL COMPARTMENT (DEQUERVAIN);  Surgeon: Jeneen Rinks  Vira Blanco, MD;  Location: [facility] ORS;  Service: Orthopedics;  Laterality: Left;  . ESOPHAGOGASTRODUODENOSCOPY N/A 01/27/2015   Procedure: ESOPHAGOGASTRODUODENOSCOPY (EGD);  Surgeon: Lollie Sails, MD;  Location: [facility] ENDOSCOPY;  Service: Endoscopy;  Laterality: N/A;  . ESOPHAGOGASTRODUODENOSCOPY (EGD) WITH PROPOFOL N/A 07/24/2015   Procedure: ESOPHAGOGASTRODUODENOSCOPY (EGD) WITH PROPOFOL;  Surgeon: Lucilla Lame, MD;  Location: [facility] ENDOSCOPY;  Service: Endoscopy;  Laterality: N/A;  . ESOPHAGOGASTRODUODENOSCOPY (EGD) WITH PROPOFOL N/A 04/12/2018   Procedure: ESOPHAGOGASTRODUODENOSCOPY (EGD) WITH PROPOFOL;  Surgeon: Jonathon Bellows, MD;  Location: [facility] ENDOSCOPY;  Service: Gastroenterology;  Laterality: N/A;  .  ESOPHAGOGASTRODUODENOSCOPY (EGD) WITH PROPOFOL N/A 06/22/2018   Procedure: ESOPHAGOGASTRODUODENOSCOPY (EGD) WITH PROPOFOL;  Surgeon: [name], MD;  Location: [facility] ENDOSCOPY;  Service: Endoscopy;  Laterality: N/A;  . ESOPHAGOGASTRODUODENOSCOPY (EGD) WITH PROPOFOL N/A 07/09/2018   Procedure: ESOPHAGOGASTRODUODENOSCOPY (EGD) WITH PROPOFOL with resection of gastric polyps;  Surgeon: Jonathon Bellows, MD;  Location: [facility] ENDOSCOPY;  Service: Gastroenterology;  Laterality: N/A;  . ESOPHAGOGASTRODUODENOSCOPY (EGD) WITH PROPOFOL N/A 05/17/2019   Procedure: ESOPHAGOGASTRODUODENOSCOPY (EGD) WITH PROPOFOL;  Surgeon: Jonathon Bellows, MD;  Location: [facility] ENDOSCOPY;  Service: Gastroenterology;  Laterality: N/A;  . EUS N/A 06/22/2018   Procedure: UPPER ENDOSCOPIC ULTRASOUND (EUS) RADIAL;  Surgeon: [name], MD;  Location: [facility] ENDOSCOPY;  Service: Endoscopy;  Laterality: N/A;  . EYE SURGERY Bilateral    cataract extractions  . JOINT REPLACEMENT Bilateral 2014   Bilateral Knee replacement  . LEAD REVISION/REPAIR N/A 08/29/2017   Procedure: LEAD REVISION/REPAIR;  Surgeon: Constance Haw, MD;  Location: [place] CV LAB;  Service: Cardiovascular;  Laterality: N/A;  . OOPHORECTOMY    . PACEMAKER INSERTION Left 07/01/2015   Procedure: INSERTION PACEMAKER;  Surgeon: Isaias Cowman, MD;  Location: [facility] ORS;  Service: Cardiovascular;  Laterality: Left;  . PACEMAKER REVISION N/A 08/28/2017   Procedure: PACEMAKER REVISION;  Surgeon: Deboraha Sprang, MD;  Location: [place] CV LAB;  Service: Cardiovascular;  Laterality: N/A;  . REPLACEMENT TOTAL KNEE BILATERAL    . SHOULDER ARTHROSCOPY WITH ROTATOR CUFF REPAIR AND OPEN BICEPS TENODESIS Left 11/21/2019   Procedure: LEFT SHOULDER ARTHROSCOPY WITH DEBRIDEMENT, DECOMPRESSION, ROTATOR CUFF REPAIR AND BICEPS TENOLYSIS;  Surgeon: Corky Mull, MD;  Location: [facility] ORS;  Service: Orthopedics;  Laterality: Left;  . SHOULDER ARTHROSCOPY WITH SUBACROMIAL  DECOMPRESSION Left 2013  . TEE WITHOUT CARDIOVERSION N/A 06/28/2017   Procedure: TRANSESOPHAGEAL ECHOCARDIOGRAM (TEE);  Surgeon: Corey Skains, MD;  Location: [facility] ORS;  Service: Cardiovascular;  Laterality: N/A;    There were no vitals filed for this visit.   Subjective Assessment - 03/11/20 1426    Subjective Patient reports compliance with HEP. Felt better after last session.    Patient is accompained by: Family member    Pertinent History Patient presents to physical therapy for spinal stenosis lumbar DDD. She is primarily confined to a wheelchair when out of her house, when she is in her house she does use a walker for assistance with ambulation. Patient was seen at [facility] for L shoulder due to being s/p surgery. Her back pain began 23,2019  Went to rehab from the hospital after being in ICU for 20 days, was dropped in the rehab facility. Manual chair is primarily use of ambulation, stands and transfers for bathroom. Pain in back is what's stopping from walking. Sleeps in a lift chair because she can't lay flat. PMH of Lumbar spine fusion (L4-S1), kyphoplasty's (T12-L1) , anxiety, a fib s/p cardioversion, CKD, DDD cervical and lumbar, DM type II,  gout, panic attacks, HTN, nephrolithiasis, OA, osteoporosis, overdose on vicoden and oxycodone for back pain, pacemaker, pneumonia, sepsis, stroke,    Limitations Sitting;Lifting;Standing;Walking;House hold activities    How long can you sit comfortably? hour at the limit    How long can you stand comfortably? no more than a minute    How long can you walk comfortably? barely able to take a few steps    Patient Stated Goals to be able to walk, and stand straight and out of pain.    Currently in Pain? Yes    Pain Score 5     Pain Location Back    Pain Orientation Lower    Pain Descriptors / Indicators Aching    Pain Type Chronic pain    Pain Onset More than a month ago    Pain Frequency Constant                 Treatment:    Transfer from transport chair to plinth table positioned into recline position: Mod A  recliner position plinth table:  -RTB resisted df 15x each LE; patient reports burning up into her back with repetition  -RTB resisted pf 15x each LE -RTB resisted abduction 12x each LE -RTB resisted single limb LAQ one LE at a time, 10x  -Adduction ball squeeze 10x 3 second holds -Posterior pelvic tilt; mod-max cueing for sequencing for reduction of thrust and introduction to slow controlled movement 10x  -TrA contraction against swiss ball with swiss ball between knees and hands 2x 10 -swiss ball upright raise 10x  -RTB row 15x    Seated edge of mat table: STM to left lumbar  Paraspinals and quadratus, implementation of effleurage and ptrissage for reduction of muscle tissue guarding and contraction; patient reports reduction of pain with repeated performance x 13 minutes    Pt educated throughout session about proper posture and technique with exercises. Improved exercise technique, movement at target joints, use of target muscles after min to mod verbal, visual, tactile cues.  Patient tolerated progression of interventions against resistance well with little to no pain increase. Use of ice during interventions decreases spasms at this time. Patient is limited in positional tolerance reducing range of interventions at this time. Patient would benefit from skilled physical therapy to reduce pain, improve mobility, and increase quality of life                   PT Education - 03/11/20 0936    Education Details exercise technique, body mechanics    Person(s) Educated Patient    Methods Explanation;Demonstration;Tactile cues;Verbal cues    Comprehension Verbalized understanding;Returned demonstration;Verbal cues required;Tactile cues required            PT Short Term Goals - 03/04/20 1224      PT SHORT TERM GOAL #1   Title Patient will be independent in home exercise program to  improve strength/mobility for better functional independence with ADLs.    Baseline 6/23: HEP given    Time 4    Period Weeks    Status New    Target Date 04/01/20             PT Long Term Goals - 03/04/20 1225      PT LONG TERM GOAL #1   Title Patient will increase FOTO score to equal to or greater than  41%   to demonstrate statistically significant improvement in mobility and quality of life.    Baseline 6/23: 36%  Time 8    Period Weeks    Status New    Target Date 04/29/20      PT LONG TERM GOAL #2   Title Patient will report a worst pain of 5/10 on VAS in low back  to improve tolerance with ADLs and reduced symptoms with activities.    Baseline 6/23: 10/10    Time 8    Period Weeks    Status New    Target Date 04/29/20      PT LONG TERM GOAL #3   Title Patient will reduce modified Oswestry score to <20 as to demonstrate minimal disability with ADLs including improved sleeping tolerance, walking/sitting tolerance etc for better mobility with ADLs.    Baseline 6/23: 58%    Time 8    Status New    Target Date 04/29/20      PT LONG TERM GOAL #4   Title Patient will be able to ambulate 40 ft with RW without LOB with LRAD for functional mobility within home.    Baseline 6/23: unable to ambulate    Time 8    Period Weeks    Status New    Target Date 04/29/20                 Plan - 03/11/20 1427    Clinical Impression Statement Patient tolerated progression of interventions against resistance well with little to no pain increase. Use of ice during interventions decreases spasms at this time. Patient is limited in positional tolerance reducing range of interventions at this time. Patient would benefit from skilled physical therapy to reduce pain, improve mobility, and increase quality of life    Personal Factors and Comorbidities Comorbidity 3+;Finances;Fitness;Past/Current Experience;Time since onset of injury/illness/exacerbation;Transportation     Comorbidities Lumbar spine fusion (L4-S1), kyphoplasty's (T12-L1) , anxiety, a fib s/p cardioversion, CKD, DDD cervical and lumbar, DM type II, gout, panic attacks, HTN, nephrolithiasis, OA, osteoporosis, overdose on vicoden and oxycodone for back pain, pacemaker, pneumonia, sepsis, stroke,    Examination-Activity Limitations Bathing;Bed Mobility;Bend;Caring for Others;Hygiene/Grooming;Dressing;Continence;Carry;Lift;Locomotion Level;Reach Overhead;Sit;Squat;Stairs;Stand;Toileting;Transfers    Examination-Participation Restrictions Church;Cleaning;Community Activity;Driving;Interpersonal Relationship;Personal Finances;Laundry;Shop;Volunteer;Yard Work;Meal Prep    Stability/Clinical Decision Making Unstable/Unpredictable    Rehab Potential Fair    PT Frequency 2x / week    PT Duration 8 weeks    PT Treatment/Interventions ADLs/Self Care Home Management;Aquatic Therapy;Biofeedback;Cryotherapy;Moist Heat;Traction;Functional mobility training;Stair training;Gait training;DME Instruction;Therapeutic activities;Therapeutic exercise;Balance training;Neuromuscular re-education;Manual techniques;Wheelchair mobility training;Patient/family education;Compression bandaging;Scar mobilization;Passive range of motion;Dry needling;Vasopneumatic Device;Taping;Energy conservation    PT Next Visit Plan pain reduction techniques, core activation, LE strength    PT Home Exercise Plan see above    Consulted and Agree with Plan of Care Patient           Patient will benefit from skilled therapeutic intervention in order to improve the following deficits and impairments:  Abnormal gait, Cardiopulmonary status limiting activity, Decreased activity tolerance, Decreased balance, Decreased endurance, Decreased coordination, Decreased mobility, Decreased range of motion, Decreased safety awareness, Difficulty walking, Decreased strength, Hypomobility, Increased edema, Impaired flexibility, Impaired perceived functional ability,  Increased muscle spasms, Impaired sensation, Impaired UE functional use, Obesity, Postural dysfunction, Improper body mechanics, Pain  Visit Diagnosis: Chronic bilateral low back pain, unspecified whether sciatica present  Difficulty in walking, not elsewhere classified  Muscle weakness (generalized)     Problem List Patient Active Problem List   Diagnosis Date Noted  . Osteoporosis 02/13/2020  . Primary osteoarthritis of left shoulder 11/10/2019  . Tendinitis of upper biceps tendon of left shoulder  11/10/2019  . Uses walker 09/27/2018  . RUQ pain   . UTI (urinary tract infection) 06/14/2018  . Type 2 diabetes mellitus with neurological complications ([place]) 98/33/8250  . Acute respiratory failure ([place]) 04/09/2018  . Chronic anticoagulation 04/09/2018  . Pacemaker 04/09/2018  . H/O TIA (transient ischemic attack) and stroke 04/09/2018  . CAD (coronary artery disease) 04/09/2018  . Hematemesis 04/05/2018  . Hypotension 02/04/2018  . Atrial fibrillation with RVR ([place]) 02/04/2018  . Healthcare maintenance 09/22/2017  . Atrial flutter ([place]) 08/24/2017  . Sinus node dysfunction ([place]) 08/24/2017  . SOBOE (shortness of breath on exertion) 06/26/2017  . Dizziness 06/12/2017  . Secondary hyperparathyroidism of renal origin ([place]) 05/09/2017  . Mastalgia 02/14/2017  . Acute on chronic renal insufficiency 01/09/2017  . Fatty liver 01/09/2017  . Anemia in stage 3 chronic kidney disease 11/02/2016  . Iron deficiency anemia 11/02/2016  . Benign essential HTN 09/08/2016  . Nephrolithiasis 02/22/2016  . Diabetic neuropathy with neurologic complication ([place]) 53/97/6734  . Moderate episode of recurrent major depressive disorder ([place]) 08/11/2015  . Recurrent major depressive disorder, in remission ([place]) 08/11/2015  . Polyp of stomach and duodenum 07/29/2015  . Gastritis without bleeding   . Gastric polyp   . Gastro-esophageal reflux disease without esophagitis   . PAF (paroxysmal atrial  fibrillation) ([place]) 06/29/2015  . Intestinal obstruction ([place]) 02/20/2015  . DDD (degenerative disc disease), lumbar 01/19/2015  . Back pain 01/12/2015  . Gout 01/12/2015  . Other specified postprocedural states 11/04/2014  . Complete tear of left rotator cuff 10/16/2014  . Intervertebral disc disorder with radiculopathy of lumbar region 09/17/2014  . Lumbar radiculitis 09/17/2014  . Lumbar stenosis with neurogenic claudication 09/17/2014  . Neuritis or radiculitis due to rupture of lumbar intervertebral disc 09/17/2014  . Atherosclerosis of both carotid arteries 07/15/2014  . Essential hypertension 07/15/2014  . Hyperlipemia, mixed 07/15/2014  . 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 Body mass index (BMI) of 40.0-44.9 in adult ([place]) 06/21/2014  . Mixed incontinence 06/25/2013  . Mixed urge and stress incontinence 06/25/2013  . Nocturia 06/25/2013  . Blurring of visual image 04/11/2012  . Other visual disturbances 04/11/2012   Janna Arch, PT, DPT   03/11/2020, 2:28 PM  [place] MAIN [facility] SERVICES [address] [place], [place], [address] Phone: [phone]   Fax:  [phone]  Name: Deborah Obrien MRN: [id number] Date of Birth: 08/23/1941

## 2020-03-11 NOTE — Patient Instructions (Signed)
-   HAVE A LARGE SERVING OF GREENS TODAY, - SKIP WARFARIN TONIGHT - continue warfarin dosage of 5 mg every day  - recheck in 4 weeks  PICK A DAY TO HAVE YOUR GREENS AND HAVE THEM ON THE SAME DAY EVERY WEEK

## 2020-03-18 ENCOUNTER — Other Ambulatory Visit: Payer: Self-pay

## 2020-03-18 ENCOUNTER — Ambulatory Visit: Payer: Medicare Other | Attending: Surgery

## 2020-03-18 DIAGNOSIS — M545 Low back pain: Secondary | ICD-10-CM | POA: Insufficient documentation

## 2020-03-18 DIAGNOSIS — G8929 Other chronic pain: Secondary | ICD-10-CM | POA: Insufficient documentation

## 2020-03-18 NOTE — Therapy (Signed)
[place] MAIN [facility] SERVICES [address] [place], [place], [address] Phone: [phone]   Fax:  [phone]  Patient Details  Name: Deborah Obrien MRN: [id number] Date of Birth: Jul 07, 1941 Referring Provider:  Corky Mull, MD  Encounter Date: 03/18/2020  Patient arrived, blood sugar > 400, going home to take medicine. Agreed to call physician/go to doctor if does not return to therapeutic range.   Janna Arch, PT, DPT   03/18/2020, 9:39 AM  [place] MAIN [facility] SERVICES [address] [place], [place], [address] Phone: [phone]   Fax:  [phone]

## 2020-03-20 ENCOUNTER — Telehealth: Payer: Self-pay | Admitting: Internal Medicine

## 2020-03-20 ENCOUNTER — Ambulatory Visit: Payer: Medicare Other

## 2020-03-20 NOTE — Telephone Encounter (Signed)
I called and spoke with the patient and her husband.  They were wanting to know if the patient could be switched from warfarin to plavix due to cost and keeping her INR regulated.  I advised that warfarin and plavix are not the same class of drug and therefore the plavix will not protect her from clot formation due to her a-fib and therefore protect her from having a stroke.   The patient and her husband voice understanding.

## 2020-03-20 NOTE — Telephone Encounter (Signed)
Patients husband calling in with a medication question regarding switching from warfarin to plavix. The issue seems to be cost related  Patient would appreciate a call back today if possible  Please advise

## 2020-03-23 ENCOUNTER — Other Ambulatory Visit: Payer: Self-pay

## 2020-03-23 ENCOUNTER — Ambulatory Visit: Payer: Medicare Other

## 2020-03-23 ENCOUNTER — Other Ambulatory Visit
Admission: RE | Admit: 2020-03-23 | Discharge: 2020-03-23 | Disposition: A | Discharge status: Home / Self Care | Payer: Medicare Other | Attending: Gastroenterology | Admitting: Gastroenterology

## 2020-03-23 ENCOUNTER — Telehealth: Payer: Self-pay | Admitting: Internal Medicine

## 2020-03-23 ENCOUNTER — Other Ambulatory Visit
Admission: RE | Admit: 2020-03-23 | Discharge: 2020-03-23 | Disposition: A | Discharge status: Home / Self Care | Payer: Medicare Other | Source: Ambulatory Visit | Attending: *Deleted | Admitting: *Deleted

## 2020-03-23 ENCOUNTER — Ambulatory Visit (INDEPENDENT_AMBULATORY_CARE_PROVIDER_SITE_OTHER): Payer: Medicare Other

## 2020-03-23 DIAGNOSIS — K317 Polyp of stomach and duodenum: Secondary | ICD-10-CM | POA: Insufficient documentation

## 2020-03-23 DIAGNOSIS — I4891 Unspecified atrial fibrillation: Secondary | ICD-10-CM | POA: Diagnosis not present

## 2020-03-23 DIAGNOSIS — Z8673 Personal history of transient ischemic attack (TIA), and cerebral infarction without residual deficits: Secondary | ICD-10-CM

## 2020-03-23 DIAGNOSIS — I480 Paroxysmal atrial fibrillation: Secondary | ICD-10-CM | POA: Diagnosis not present

## 2020-03-23 DIAGNOSIS — Z5181 Encounter for therapeutic drug level monitoring: Secondary | ICD-10-CM | POA: Diagnosis not present

## 2020-03-23 LAB — IRON AND TIBC
Iron: 43 ug/dL (ref 28–170)
Saturation Ratios: 11 % (ref 10.4–31.8)
TIBC: 382 ug/dL (ref 250–450)
UIBC: 339 ug/dL

## 2020-03-23 LAB — CBC WITH DIFFERENTIAL/PLATELET
Abs Immature Granulocytes: 0.01 10*3/uL (ref 0.00–0.07)
Basophils Absolute: 0 10*3/uL (ref 0.0–0.1)
Basophils Relative: 0 %
Eosinophils Absolute: 0.1 10*3/uL (ref 0.0–0.5)
Eosinophils Relative: 1 %
HCT: 35.3 % — ABNORMAL LOW (ref 36.0–46.0)
Hemoglobin: 11.5 g/dL — ABNORMAL LOW (ref 12.0–15.0)
Immature Granulocytes: 0 %
Lymphocytes Relative: 31 %
Lymphs Abs: 1.8 10*3/uL (ref 0.7–4.0)
MCH: 29 pg (ref 26.0–34.0)
MCHC: 32.6 g/dL (ref 30.0–36.0)
MCV: 88.9 fL (ref 80.0–100.0)
Monocytes Absolute: 0.5 10*3/uL (ref 0.1–1.0)
Monocytes Relative: 8 %
Neutro Abs: 3.4 10*3/uL (ref 1.7–7.7)
Neutrophils Relative %: 60 %
Platelets: 224 10*3/uL (ref 150–400)
RBC: 3.97 MIL/uL (ref 3.87–5.11)
RDW: 16.3 % — ABNORMAL HIGH (ref 11.5–15.5)
WBC: 5.8 10*3/uL (ref 4.0–10.5)
nRBC: 0 % (ref 0.0–0.2)

## 2020-03-23 LAB — POCT INR: INR: 1.5 — AB (ref 2.0–3.0)

## 2020-03-23 LAB — FERRITIN: Ferritin: 15 ng/mL (ref 11–307)

## 2020-03-23 NOTE — Telephone Encounter (Signed)
Patient states that she has bruises since she has started on Warfarin, states she has "sores " on her arms, also state she has a bruise on her leg that is concerning her. Please call to discuss.

## 2020-03-23 NOTE — Patient Instructions (Signed)
-   take 2 tablets warfarin tonight - continue warfarin dosage of 5 mg every day  - recheck on 7/21 as scheduled  PICK A DAY TO HAVE YOUR GREENS AND HAVE THEM ON THE SAME DAY EVERY WEEK

## 2020-03-23 NOTE — Telephone Encounter (Addendum)
Spoke with the patient. Patient sts that she has been having excess bruising since she was switched from Eliquis to [place]. Patient sts that she doesn't have to bump into anything and she will notice a bruise. Patient sts that the bruise on her leg she has had a while, from the patients description the bruise seems to be healing. Patient denies any other signs of bleeding.  Patient last INR check was on 03/11/20 and was 4.2. Patient is concerned that her blood is to thin. Their are existing lab orders from her GI physician for a cbc and iron to be drawan. I asked the patient if she would be able to get those drawn soon. Patient sts that she will have those lab drawn today.  Advised the patient that I will fwd her concern to [name] and our [place], [place]. Patient verbalized understanding and voiced appreciation for the call.

## 2020-03-23 NOTE — Telephone Encounter (Signed)
Spoke w/ pt.  Asked her to come in today for INR check, if possible. She will come in here at 2:00 and have her labs drawn while she is out.

## 2020-03-30 ENCOUNTER — Other Ambulatory Visit: Payer: Self-pay | Admitting: Family Medicine

## 2020-03-30 DIAGNOSIS — R0781 Pleurodynia: Secondary | ICD-10-CM

## 2020-03-31 ENCOUNTER — Encounter: Payer: Self-pay | Admitting: Internal Medicine

## 2020-03-31 ENCOUNTER — Telehealth: Payer: Self-pay

## 2020-03-31 ENCOUNTER — Ambulatory Visit (INDEPENDENT_AMBULATORY_CARE_PROVIDER_SITE_OTHER): Payer: Medicare Other | Admitting: Internal Medicine

## 2020-03-31 ENCOUNTER — Other Ambulatory Visit: Payer: Self-pay

## 2020-03-31 VITALS — BP 114/58 | HR 64 | Ht 64.0 in | Wt 203.0 lb

## 2020-03-31 DIAGNOSIS — K317 Polyp of stomach and duodenum: Secondary | ICD-10-CM

## 2020-03-31 DIAGNOSIS — I480 Paroxysmal atrial fibrillation: Secondary | ICD-10-CM

## 2020-03-31 DIAGNOSIS — R1013 Epigastric pain: Secondary | ICD-10-CM

## 2020-03-31 DIAGNOSIS — I495 Sick sinus syndrome: Secondary | ICD-10-CM | POA: Diagnosis not present

## 2020-03-31 DIAGNOSIS — Z950 Presence of cardiac pacemaker: Secondary | ICD-10-CM

## 2020-03-31 NOTE — Telephone Encounter (Signed)
Called pt to inform of results and Dr. Anna's recommendations.  Unable to contact, LVM to return call 

## 2020-03-31 NOTE — Telephone Encounter (Signed)
-----   Message from Jonathon Bellows, MD sent at 03/26/2020  1:38 PM EDT ----- Inform   Labs look stable - repeat iron studies and cbc in 6 months - can reschedule follow up after that

## 2020-03-31 NOTE — Progress Notes (Signed)
Patient Care Team: [name], MD as PCP - General (Internal Medicine) Deboraha Sprang, MD as PCP - Cardiology (Cardiology) [name], MD (Internal Medicine) Bary Castilla Forest Gleason, MD (General Surgery)   HPI  Deborah Obrien is a 79 y.o. female Seen for sinus node dysfunction exercise intolerance and previously implanted single chamber pacemaker for which she underwent dual chamber upgrade 12/18 cx by encircling of the chronic RV lead by the atrial lead resulting in tension for which she underwent RA lead revision   She has hx of atrial arrhythmias, with atrial fib and flutter, typical/atypical all documented.  on amiodarone  .  August 2019  GI bleeding.   >> Recurrent atrial flutter 9/19 pace terminated in the office.   No interval atrial fibrillation of which she is aware.  Patient denies symptoms of GI intolerance, sun sensitivity, neurological symptoms attributable to amiodarone.  She does however have a dry hacking cough for the last 6 months.    Date Test EF   11/16  Echo   55-60   12/17 Echo   45% Stress neg  12/17 Cath  Nonobstructive CAD  Mr 2+  2/19 Myoview  55-65% No ischemia  7/19 Echo    60-65% Mild RVE     Date Cr K Hgb TSH LFTs  10/18 1.72  13.5    12/18 1.32  13.5    6/19 1.57 5.5>>4.0 13.7 2.95 71(5/19)  8/19 1.53 3.8 11.8>>12.7  14  10/19   12.2 (11/19)  20  1/20 1.94 4.3 11.5 10.67 21  5/20 1.6 4.8  4.135 50  1/21 1.6<<2.1 4.7 12.1 (10/20) 7.133 33  4/21 1.66 4.9 11.7 4.1 (CE) 5/21 18       Past Medical History:  Diagnosis Date  . A-fib ([place]) 2016  . Acid reflux   . Anemia   . Anxiety   . Bowel obstruction ([place])   . Carotid artery disease ([place])   . CHF (congestive heart failure) ([place]) 2017  . Chronic kidney disease (CKD) stage G3a/A1, moderately decreased glomerular filtration rate (GFR) between 45-59 mL/min/1.73 square meter and albuminuria creatinine ratio less than 30 mg/g    stage IV  . Coronary artery disease    . Depression   . Diabetes mellitus without complication ([place])   . Dyspnea   . Dysrhythmia    PAROXYSMAL ATRIAL FIBRILLATION  . Fatty liver   . GI bleed   . Gout   . Hypertension   . Hypothyroidism   . Low back pain    history of kyphoplasty t12-l1  . Morbid obesity ([place])   . Multiple gastric polyps    history of gi bleed.  polyps removed  . Neuromuscular disorder ([place])    diabetic neuropathy  . Osteoarthritis   . Osteoporosis   . Paroxysmal atrial fibrillation (HCC)   . Presence of permanent cardiac pacemaker   . Sinus node dysfunction ([place]) 08/24/2017  . Stroke [facility])    TIA, patient unaware that she had it. no residual  . TIA (transient ischemic attack)     Past Surgical History:  Procedure Laterality Date  . ABDOMINAL HYSTERECTOMY  1980  . APPENDECTOMY    . BACK SURGERY     2008. plate & screws   . BACK SURGERY  2019   patient describes kyphoplasty for compression fractures, MD referral said fusion  . BREAST BIOPSY Right 08/16   stereo fibroadenomatous change, neg for atypia  . BREAST EXCISIONAL BIOPSY  Left    neg  . CARDIAC CATHETERIZATION Left 08/25/2016   Procedure: Left Heart Cath and Coronary Angiography;  Surgeon: Corey Skains, MD;  Location: [place] [facility];  Service: Cardiovascular;  Laterality: Left;  . CARDIOVERSION N/A 06/28/2017   Procedure: CARDIOVERSION;  Surgeon: Corey Skains, MD;  Location: [facility] ORS;  Service: Cardiovascular;  Laterality: N/A;  . CARDIOVERSION N/A 02/06/2018   Procedure: CARDIOVERSION;  Surgeon: Josue Hector, MD;  Location: [place];  Service: Cardiovascular;  Laterality: N/A;  . CATARACT EXTRACTION, BILATERAL  2012  . CHOLECYSTECTOMY    . colon blockage  1999  . COLON SURGERY  2000   removed 20% of colon. colon had collapsed  . COLONOSCOPY  2016   polyps removed 2016  . DORSAL COMPARTMENT RELEASE Left 09/14/2016   Procedure: RELEASE DORSAL COMPARTMENT (DEQUERVAIN);  Surgeon: Dereck Leep, MD;  Location:  [facility];  Service: Orthopedics;  Laterality: Left;  . ESOPHAGOGASTRODUODENOSCOPY N/A 01/27/2015   Procedure: ESOPHAGOGASTRODUODENOSCOPY (EGD);  Surgeon: Lollie Sails, MD;  Location: [facility] ENDOSCOPY;  Service: Endoscopy;  Laterality: N/A;  . ESOPHAGOGASTRODUODENOSCOPY (EGD) WITH PROPOFOL N/A 07/24/2015   Procedure: ESOPHAGOGASTRODUODENOSCOPY (EGD) WITH PROPOFOL;  Surgeon: Lucilla Lame, MD;  Location: [facility] ENDOSCOPY;  Service: Endoscopy;  Laterality: N/A;  . ESOPHAGOGASTRODUODENOSCOPY (EGD) WITH PROPOFOL N/A 04/12/2018   Procedure: ESOPHAGOGASTRODUODENOSCOPY (EGD) WITH PROPOFOL;  Surgeon: Jonathon Bellows, MD;  Location: [facility] ENDOSCOPY;  Service: Gastroenterology;  Laterality: N/A;  . ESOPHAGOGASTRODUODENOSCOPY (EGD) WITH PROPOFOL N/A 06/22/2018   Procedure: ESOPHAGOGASTRODUODENOSCOPY (EGD) WITH PROPOFOL;  Surgeon: [name], MD;  Location: [facility] ENDOSCOPY;  Service: Endoscopy;  Laterality: N/A;  . ESOPHAGOGASTRODUODENOSCOPY (EGD) WITH PROPOFOL N/A 07/09/2018   Procedure: ESOPHAGOGASTRODUODENOSCOPY (EGD) WITH PROPOFOL with resection of gastric polyps;  Surgeon: Jonathon Bellows, MD;  Location: [facility] ENDOSCOPY;  Service: Gastroenterology;  Laterality: N/A;  . ESOPHAGOGASTRODUODENOSCOPY (EGD) WITH PROPOFOL N/A 05/17/2019   Procedure: ESOPHAGOGASTRODUODENOSCOPY (EGD) WITH PROPOFOL;  Surgeon: Jonathon Bellows, MD;  Location: [facility] ENDOSCOPY;  Service: Gastroenterology;  Laterality: N/A;  . EUS N/A 06/22/2018   Procedure: UPPER ENDOSCOPIC ULTRASOUND (EUS) RADIAL;  Surgeon: [name], MD;  Location: [facility] ENDOSCOPY;  Service: Endoscopy;  Laterality: N/A;  . EYE SURGERY Bilateral    cataract extractions  . JOINT REPLACEMENT Bilateral 2014   Bilateral Knee replacement  . LEAD REVISION/REPAIR N/A 08/29/2017   Procedure: LEAD REVISION/REPAIR;  Surgeon: Constance Haw, MD;  Location: [place] [facility];  Service: Cardiovascular;  Laterality: N/A;  . OOPHORECTOMY    . PACEMAKER INSERTION Left 07/01/2015    Procedure: INSERTION PACEMAKER;  Surgeon: Isaias Cowman, MD;  Location: [facility];  Service: Cardiovascular;  Laterality: Left;  . PACEMAKER REVISION N/A 08/28/2017   Procedure: PACEMAKER REVISION;  Surgeon: Deboraha Sprang, MD;  Location: [place] CV LAB;  Service: Cardiovascular;  Laterality: N/A;  . REPLACEMENT TOTAL KNEE BILATERAL    . SHOULDER ARTHROSCOPY WITH ROTATOR CUFF REPAIR AND OPEN BICEPS TENODESIS Left 11/21/2019   Procedure: LEFT SHOULDER ARTHROSCOPY WITH DEBRIDEMENT, DECOMPRESSION, ROTATOR CUFF REPAIR AND BICEPS TENOLYSIS;  Surgeon: Corky Mull, MD;  Location: [facility];  Service: Orthopedics;  Laterality: Left;  . SHOULDER ARTHROSCOPY WITH SUBACROMIAL DECOMPRESSION Left 2013  . TEE WITHOUT CARDIOVERSION N/A 06/28/2017   Procedure: TRANSESOPHAGEAL ECHOCARDIOGRAM (TEE);  Surgeon: Corey Skains, MD;  Location: [facility];  Service: Cardiovascular;  Laterality: N/A;    Current Outpatient Medications  Medication Sig Dispense Refill  . allopurinol (ZYLOPRIM) 100 MG tablet Take 150 mg by mouth daily.     Marland Kitchen  amiodarone (PACERONE) 200 MG tablet Take 1 tablet (200 mg total) by mouth daily. 90 tablet 0  . busPIRone (BUSPAR) 10 MG tablet Take 10 mg by mouth 2 (two) times daily.     . colchicine 0.6 MG tablet Take 0.6 mg by mouth daily as needed (gout).     . Cyanocobalamin 2500 MCG SUBL Place 2,500 mcg under the tongue daily.     Marland Kitchen docusate sodium (COLACE) 100 MG capsule Take 200 mg by mouth 2 (two) times daily.    . Ergocalciferol (VITAMIN D2) 10 MCG (400 UNIT) TABS Take 400 Units by mouth in the morning and at bedtime.    . furosemide (LASIX) 20 MG tablet Take 20 mg by mouth daily.    Marland Kitchen gabapentin (NEURONTIN) 600 MG tablet Take 600 mg by mouth 3 (three) times daily.    Marland Kitchen HYDROcodone-acetaminophen (NORCO) 10-325 MG tablet Take 1 tablet by mouth every 4 (four) hours as needed for moderate pain. 50 tablet 0  . insulin NPH-regular Human (NOVOLIN 70/30) (70-30) 100 UNIT/ML injection  Inject 25 Units into the skin 2 (two) times daily with a meal.     . ketoconazole (NIZORAL) 2 % cream Apply 1 application topically daily as needed for irritation.    Marland Kitchen levothyroxine (SYNTHROID) 50 MCG tablet Take 50 mcg by mouth daily before breakfast.    . oxybutynin (DITROPAN) 5 MG tablet Take 10 mg by mouth 2 (two) times daily.    . pantoprazole (PROTONIX) 40 MG tablet Take 1 tablet (40 mg total) by mouth daily. 90 tablet 1  . Polyethyl Glycol-Propyl Glycol (SYSTANE) 0.4-0.3 % SOLN Place 2 drops into both eyes 2 (two) times daily as needed (dry eyes).     . pravastatin (PRAVACHOL) 20 MG tablet Take 20 mg by mouth every evening.  2  . talc (ZEASORB) powder Apply 1 application topically as needed (under the breast irritation).    . traZODone (DESYREL) 50 MG tablet Take 50 mg by mouth at bedtime.     . vitamin C (ASCORBIC ACID) 500 MG tablet Take 500 mg by mouth 2 (two) times daily.    Marland Kitchen warfarin (COUMADIN) 5 MG tablet Take 0.5-1 tablet daily as directed by coumadin clinic 30 tablet 1  . DULoxetine (CYMBALTA) 30 MG capsule Take 30 mg by mouth daily.      No current facility-administered medications for this visit.    Allergies  Allergen Reactions  . Fish Allergy Shortness Of Breath    All kinds of fish. Severe vomitting even with smell of fish.   . Macrolides And Ketolides Other (See Comments)    unknown   . Meperidine Shortness Of Breath, Nausea Only and Other (See Comments)    Reaction:  Stomach pain   . Other Nausea Only, Swelling, Rash, Anaphylaxis, Diarrhea, Shortness Of Breath and Other (See Comments)    Allergen: NON-STEROIDS, bextra - hands and feet swell  Reaction:  Stomach pain   Other Reaction: Intolerance  . Prednisone Anaphylaxis and Other (See Comments)    Reaction:  Increases pts blood sugar  Pt states that she is allergic to all steroids.    . Shellfish Allergy Anaphylaxis, Shortness Of Breath, Diarrhea, Nausea Only and Rash    Reaction:  Stomach pain   . Sulfa  Antibiotics Shortness Of Breath, Diarrhea, Nausea Only and Other (See Comments)    Reaction:  Stomach pain   Reaction: Stomach pain  Other Reaction: Intolerance  . Sulfacetamide Sodium Diarrhea, Nausea Only, Other (See Comments) and  Shortness Of Breath    Reaction:  Stomach pain   . Telbivudine     Reaction:Unknown   . Uloric [Febuxostat] Anaphylaxis    Locks pt's body up   . Aspirin Other (See Comments) and Rash    Reaction:  Stomach pain  Reaction:  Stomach pain   . Celecoxib Other (See Comments)    Reaction:  GI bleed, weakness, and stomach pain.    . Cephalexin Diarrhea, Nausea Only and Other (See Comments)    Reaction:  Stomach pain   . Codeine Diarrhea, Nausea Only and Other (See Comments)    Reaction:  Stomach pain Pt tolerates morphine   . Dilaudid [Hydromorphone Hcl]     Reactions: easy to overdose - blood pressure drops really low  . Erythromycin Diarrhea, Nausea Only and Other (See Comments)    Reaction:  Stomach pain   . Hydromorphone Other (See Comments)    Reaction:  Hypotension   . Iodinated Diagnostic Agents Rash and Other (See Comments)    Pt states that she is unable to have because she has chronic kidney disease.   CKD Pt states that she is unable to have because she has chronic kidney disease.  CKD   . Lipitor [Atorvastatin Calcium] Nausea Only    Reaction: nausea, weakness, pass blood  . Oxycodone Other (See Comments)    Reaction:  Stomach pain   . Aleve [Naproxen]     Reaction: severe stomach pain  . Atorvastatin Nausea Only and Other (See Comments)    Reaction:  Weakness   . Cephalosporins Other (See Comments)    Reaction:  Unknown   . Doxycycline     Stomach pain   . Iodine Rash  . Motrin [Ibuprofen]     Reaction: severe stomach pain  . Tape Other (See Comments)    Reaction:  Causes pts skin to tear  Pt states that she is able to use paper tape.      . Valdecoxib Swelling and Other (See Comments)    Pt states that her hands and feet  swell.        Review of Systems negative except from HPI and PMH  Physical Exam   BP (!) 114/58 (BP Location: Left Arm, Patient Position: Sitting, Cuff Size: Normal)   Pulse 64   Ht 5\' 4"  (1.626 m)   Wt 203 lb (92.1 kg)   SpO2 98%   BMI 34.84 kg/m  Well developed and  obese in no acute distress HENT normal Neck supple with JVP-flat  Clear Device pocket well healed; without hematoma or erythema.  There is no tethering   Regular rate and rhythm, no   gallop No  /  murmur Abd-soft with active BS No Clubbing cyanosis   edema Skin-warm and dry A & Oriented  Grossly normal sensory and motor function  ECG atrial pacing at 64 22/08/45 Low voltage  Assessment and  Plan  Sinus node dysfunction  Paroxysmal atrial fibrillation/flutter with a rapid ventricular response  High Risk Medication Surveillance--Amiodarone  Cardiomyopathy-interval normalization   Diabetes mellitus with nephropathy  HFpEF  Hypotension  RV enlargement  Thromboembolic risk profile as noted above   Medtronic pacemaker upgraded to dual chamber    Cough question amiodarone lung injury  minimal intercurrent atrial fibrillation or flutter   Amiodarone concerning for lung toxicity with non productive cough she is scheduled to have a CT scan of her chest on Thursday.  We will modify this for high resolution to look for evidence  of amiodarone lung injury.  Blood pressure well controlled.  Euvolemic continue current meds

## 2020-03-31 NOTE — Patient Instructions (Signed)
Medication Instructions:  - Your physician recommends that you continue on your current medications as directed. Please refer to the Current Medication list given to you today.  *If you need a refill on your cardiac medications before your next appointment, please call your pharmacy*   Lab Work: - none ordered  If you have labs (blood work) drawn today and your tests are completely normal, you will receive your results only by: Marland Kitchen MyChart Message (if you have MyChart) OR . A paper copy in the mail If you have any lab test that is abnormal or we need to change your treatment, we will call you to review the results.   Testing/Procedures: - none ordered   Follow-Up: At [facility], you and your health needs are our priority.  As part of our continuing mission to provide you with exceptional heart care, we have created designated Provider Care Teams.  These Care Teams include your primary Cardiologist (physician) and Advanced Practice Providers (APPs -  Physician Assistants and Nurse Practitioners) who all work together to provide you with the care you need, when you need it.  We recommend signing up for the patient portal called "MyChart".  Sign up information is provided on this After Visit Summary.  MyChart is used to connect with patients for Virtual Visits (Telemedicine).  Patients are able to view lab/test results, encounter notes, upcoming appointments, etc.  Non-urgent messages can be sent to your provider as well.   To learn more about what you can do with MyChart, go to [email or web address].    Your next appointment:   6 month(s)  The format for your next appointment:   In Person  Provider:   [name], MD   Other Instructions n/a

## 2020-04-01 ENCOUNTER — Ambulatory Visit (INDEPENDENT_AMBULATORY_CARE_PROVIDER_SITE_OTHER): Payer: Medicare Other

## 2020-04-01 DIAGNOSIS — Z8673 Personal history of transient ischemic attack (TIA), and cerebral infarction without residual deficits: Secondary | ICD-10-CM

## 2020-04-01 DIAGNOSIS — I480 Paroxysmal atrial fibrillation: Secondary | ICD-10-CM

## 2020-04-01 DIAGNOSIS — I4891 Unspecified atrial fibrillation: Secondary | ICD-10-CM

## 2020-04-01 DIAGNOSIS — Z5181 Encounter for therapeutic drug level monitoring: Secondary | ICD-10-CM | POA: Diagnosis not present

## 2020-04-01 LAB — POCT INR: INR: 3.8 — AB (ref 2.0–3.0)

## 2020-04-01 NOTE — Telephone Encounter (Signed)
Patient called back and verbalized understanding. She states she will do lab work in 6 months. Put a office visit recall also

## 2020-04-01 NOTE — Patient Instructions (Signed)
-   skip warfarin tonight - continue warfarin dosage of 5 mg every day  - recheck in 2 weeks  PICK A DAY TO HAVE YOUR GREENS AND HAVE THEM ON THE SAME DAY EVERY WEEK

## 2020-04-02 ENCOUNTER — Other Ambulatory Visit: Payer: Self-pay

## 2020-04-02 ENCOUNTER — Ambulatory Visit
Admission: RE | Admit: 2020-04-02 | Discharge: 2020-04-02 | Disposition: A | Discharge status: Home / Self Care | Payer: Medicare Other | Source: Ambulatory Visit | Attending: Family Medicine | Admitting: Family Medicine

## 2020-04-02 DIAGNOSIS — R0781 Pleurodynia: Secondary | ICD-10-CM

## 2020-04-06 ENCOUNTER — Telehealth: Payer: Self-pay | Admitting: Internal Medicine

## 2020-04-06 NOTE — Telephone Encounter (Signed)
Patients husband calling in stating that patients CT images are back and wanted to make sure [name] would look at the images he requested. Patient also wanting to discuss medication side effects that are making patients voice hoarse  Please advise

## 2020-04-06 NOTE — Telephone Encounter (Signed)
High resolution chest CT:  IMPRESSION: 1. Nondisplaced fracture of the lateral aspect of the left seventh rib with some adjacent pleural thickening. 2. Very subtle changes in the extreme lung bases which could reflect very early or mild interstitial lung disease. At this time, findings are considered nonspecific and indeterminate for usual interstitial pneumonia (UIP) per current ATS guidelines. If there is persistent clinical concern for interstitial lung disease, repeat high-resolution chest CT could be obtained in 1 year to assess for temporal changes in the appearance of the lung parenchyma. 3. Aortic atherosclerosis, in addition to left main and 3 vessel coronary artery disease. Assessment for potential risk factor modification, dietary therapy or pharmacologic therapy may be warranted, if clinically indicated.   To [name] to review.

## 2020-04-07 NOTE — Telephone Encounter (Signed)
I was on the phone with [name] yesterday about another patient and asked that he look at the CT report for Mrs. Hosang>> per [name]- no evidence of amiodarone induced lung injury.   I attempted to call the patient/ her husband. No answer- no voice mail.  I will try back at a later time.

## 2020-04-07 NOTE — Telephone Encounter (Signed)
I spoke with the patient and advised her of Dr. Olin Pia impression of her lung CT. She is advised to continue amiodarone at this time. She states she was told by the original ordering provider for the CT that she has the beginning stages of lung disease and she was referred to [facility] and has an appointment there on Monday.   I advised I will follow along with notes from her pulmonary doctor and Monday and make [name] aware.  The patient voices understanding and is agreeable.

## 2020-04-14 ENCOUNTER — Ambulatory Visit: Payer: Medicare Other

## 2020-04-15 ENCOUNTER — Ambulatory Visit (INDEPENDENT_AMBULATORY_CARE_PROVIDER_SITE_OTHER): Payer: Medicare Other

## 2020-04-15 ENCOUNTER — Other Ambulatory Visit: Payer: Self-pay

## 2020-04-15 DIAGNOSIS — I480 Paroxysmal atrial fibrillation: Secondary | ICD-10-CM | POA: Diagnosis not present

## 2020-04-15 DIAGNOSIS — I4891 Unspecified atrial fibrillation: Secondary | ICD-10-CM

## 2020-04-15 DIAGNOSIS — Z8673 Personal history of transient ischemic attack (TIA), and cerebral infarction without residual deficits: Secondary | ICD-10-CM | POA: Diagnosis not present

## 2020-04-15 DIAGNOSIS — Z5181 Encounter for therapeutic drug level monitoring: Secondary | ICD-10-CM

## 2020-04-15 LAB — POCT INR: INR: 2.7 (ref 2.0–3.0)

## 2020-04-15 NOTE — Patient Instructions (Signed)
-   continue warfarin dosage of 5 mg every day  - recheck in 3 weeks  PICK A DAY TO HAVE YOUR GREENS AND HAVE THEM ON THE SAME DAY EVERY WEEK

## 2020-04-23 ENCOUNTER — Telehealth: Payer: Self-pay

## 2020-04-23 NOTE — Telephone Encounter (Signed)
Called spoke with pt, advised we need to check her INR in the Coumadin Clinic prior to transitioning her off Warfarin and back onto Eliquis.  Pt states she has plenty of Warfarin to get to her next scheduled follow-up appt with [facility] in the [facility] on 05/06/20.  Pt is aware her INR needs to be less than 2.0, prior to starting on Eliquis. Will send FYI to [facility] to make her aware pt will be transitioning off Warfarin and onto Eliquis.  Pt will keep scheduled follow-up appt.

## 2020-04-23 NOTE — Telephone Encounter (Signed)
I called and spoke with the patient & her husband. I advised I had received the notification about her eliquis patient assistance being approved.  I called to confirm if she still had some eliquis at home. Per the patient's husband, they have ~ 45 tablets of Eliquis 5 mg. They are aware that I will reach out to the CVRR clinic to see if they can assist with transitioning her back to eliquis from her warfarin.  The patient and her husband voice understanding and are agreeable.

## 2020-04-23 NOTE — Telephone Encounter (Signed)
Per fax from [place] [facility], patient has been approved free-of-charge from 04/22/2020 through 09/11/2020 for Eliquis. Application Case #: [id number]

## 2020-04-24 ENCOUNTER — Telehealth: Payer: Self-pay | Admitting: Internal Medicine

## 2020-04-24 NOTE — Telephone Encounter (Signed)
Patient husband would like a call to discuss Eliquis. He would not give any more information. Please call to discuss

## 2020-04-24 NOTE — Telephone Encounter (Signed)
Attempted to call the patient/ her husband back. No answer- no voice mail after multiple rings.

## 2020-04-24 NOTE — Telephone Encounter (Signed)
I spoke with the patient and her husband. They are wanting to move her CVRR appointment up to Monday 8/16 in order to transition her back to eliquis from warfarin. I have r/s her Monday 04/27/20 at 10:45 am.  The patient and her husband are agreeable.

## 2020-04-24 NOTE — Telephone Encounter (Signed)
Patient spouse calling back .  He states he was on the other line with [employer] and could not switch over.

## 2020-04-27 ENCOUNTER — Ambulatory Visit (INDEPENDENT_AMBULATORY_CARE_PROVIDER_SITE_OTHER): Payer: Medicare Other

## 2020-04-27 ENCOUNTER — Other Ambulatory Visit: Payer: Self-pay

## 2020-04-27 DIAGNOSIS — Z8673 Personal history of transient ischemic attack (TIA), and cerebral infarction without residual deficits: Secondary | ICD-10-CM

## 2020-04-27 DIAGNOSIS — Z5181 Encounter for therapeutic drug level monitoring: Secondary | ICD-10-CM | POA: Diagnosis not present

## 2020-04-27 DIAGNOSIS — I4891 Unspecified atrial fibrillation: Secondary | ICD-10-CM

## 2020-04-27 DIAGNOSIS — I480 Paroxysmal atrial fibrillation: Secondary | ICD-10-CM

## 2020-04-27 LAB — POCT INR: INR: 3 (ref 2.0–3.0)

## 2020-04-27 NOTE — Patient Instructions (Addendum)
-   STOP warfarin - Have a serving of greens today - On Wednesday morning, start Eliquis 5 mg twice daily

## 2020-05-05 ENCOUNTER — Ambulatory Visit: Payer: Medicare Other

## 2020-05-19 ENCOUNTER — Telehealth: Payer: Self-pay | Admitting: Internal Medicine

## 2020-05-19 NOTE — Telephone Encounter (Signed)
Pt c/o medication issue:  1. Name of Medication: ofev for idiopathic pulm fibrosis   2. How are you currently taking this medication (dosage and times per day)? Not started yet needs approval   3. Are you having a reaction (difficulty breathing--STAT)? No   4. What is your medication issue? Patient needs approval from cards to start new med   Please call to discuss

## 2020-05-19 NOTE — Telephone Encounter (Signed)
I spoke with the patient.  She states she was referred to Pulmonary at [facility]. She was diagnosed with idopathic pulmonary fibrosis. She called today stating her Pulmonary doctor has recommended she start Ofev 150 mg BID. She was told to check with all of her physicians to make sure they did not feel this would interfere with any of her other medications. Per the patient, she was told this could have effects on her liver.  I advised with her taking amiodarone, this would be my only concern, but I will need to forward to [name] and pharmacy to review and weigh in. The patient is aware we will be back in touch with her once further recommendations are received.  The patient voices understanding and is agreeable.

## 2020-05-20 ENCOUNTER — Telehealth: Payer: Self-pay

## 2020-05-20 NOTE — Telephone Encounter (Signed)
I agree with MM about the need for pulm to discuss bleeding issues, but her Thromboembolic risk factors ( age  -2  HTN-1 TIA/CVA-2 , DM-1    Gender-1 ) for a CHADSVASc Score of >=7 so her risks of stroke are not at all inconsequential if she has to stop the [place]  for the new medicine

## 2020-05-20 NOTE — Telephone Encounter (Signed)
Pt left vm requesting Dr. Georgeann Oppenheim opinion. Pt states her pulmonary provider wants to start her on a medication called Ofev, pt wants Dr. Vicente Males to advise if this medication would interfere with her GI condition.

## 2020-05-20 NOTE — Telephone Encounter (Addendum)
There is no interaction between Ofev and amiodarone However there is an interaction between Ofev and Eliquis. Both medications can increase the risk of bleeding. Patient will need to have a discussion with pulmonologist about the risk vs benefit of using Ofev.

## 2020-05-21 NOTE — Telephone Encounter (Signed)
Generally it shouldn't , medication to be taken with food to reduce GI side effects.

## 2020-05-21 NOTE — Telephone Encounter (Signed)
Pt has been notified of Dr. Georgeann Oppenheim recommendations.

## 2020-05-21 NOTE — Telephone Encounter (Signed)
I spoke with the patient and her husband. I have advised her of the feedback/ recommendations I have received from both [name] & pharmacy.  The patient and her husband voice understanding. Per the patient, the more feedback she receives from different providers the more she feels she will not start the Ofev.  I have advised her this a discussion she can have further with her pulmonary doctor, but that ultimately she will need to decide what is right for her, which may be quality of life vs quantity.  The patient and her husband were very appreciative for call back.

## 2020-05-25 ENCOUNTER — Other Ambulatory Visit: Payer: Self-pay | Admitting: Internal Medicine

## 2020-05-25 DIAGNOSIS — I480 Paroxysmal atrial fibrillation: Secondary | ICD-10-CM

## 2020-05-26 ENCOUNTER — Ambulatory Visit (INDEPENDENT_AMBULATORY_CARE_PROVIDER_SITE_OTHER): Payer: Medicare Other | Admitting: *Deleted

## 2020-05-26 DIAGNOSIS — I4891 Unspecified atrial fibrillation: Secondary | ICD-10-CM | POA: Diagnosis not present

## 2020-05-26 LAB — CUP PACEART REMOTE DEVICE CHECK
Battery Remaining Longevity: 120 mo
Battery Voltage: 2.95 V
Brady Statistic AP VP Percent: 0.03 %
Brady Statistic AP VS Percent: 96.82 %
Brady Statistic AS VP Percent: 0 %
Brady Statistic AS VS Percent: 3.15 %
Brady Statistic RA Percent Paced: 96.85 %
Brady Statistic RV Percent Paced: 0.03 %
Date Time Interrogation Session: 20210914012941
Implantable Lead Implant Date: 20161019
Implantable Lead Implant Date: 20181217
Implantable Lead Location: 753859
Implantable Lead Location: 753860
Implantable Lead Model: 5076
Implantable Lead Model: 5076
Implantable Pulse Generator Implant Date: 20181217
Lead Channel Impedance Value: 304 Ohm
Lead Channel Impedance Value: 361 Ohm
Lead Channel Impedance Value: 361 Ohm
Lead Channel Impedance Value: 418 Ohm
Lead Channel Pacing Threshold Amplitude: 0.75 V
Lead Channel Pacing Threshold Amplitude: 0.75 V
Lead Channel Pacing Threshold Pulse Width: 0.4 ms
Lead Channel Pacing Threshold Pulse Width: 0.4 ms
Lead Channel Sensing Intrinsic Amplitude: 4.5 mV
Lead Channel Sensing Intrinsic Amplitude: 4.5 mV
Lead Channel Sensing Intrinsic Amplitude: 5.25 mV
Lead Channel Sensing Intrinsic Amplitude: 5.25 mV
Lead Channel Setting Pacing Amplitude: 2 V
Lead Channel Setting Pacing Amplitude: 2.5 V
Lead Channel Setting Pacing Pulse Width: 0.4 ms
Lead Channel Setting Sensing Sensitivity: 1.2 mV

## 2020-05-28 NOTE — Progress Notes (Signed)
Remote pacemaker transmission.   

## 2020-05-29 DIAGNOSIS — S2231XA Fracture of one rib, right side, initial encounter for closed fracture: Secondary | ICD-10-CM

## 2020-05-29 HISTORY — DX: Fracture of one rib, right side, initial encounter for closed fracture: S22.31XA

## 2020-06-19 IMAGING — CT CT ABD-PELV W/O CM
2 of 4 series · 16 of 46 positions shown, 18 images · non-contrast
Comparison: 06/17/2018 abdominal ultrasound. Most recent CT
06/14/2018.

CLINICAL DATA: Abdominal cramping. Nausea. Vomiting. Contrast
allergy.

EXAM:
CT ABDOMEN AND PELVIS WITHOUT CONTRAST
TECHNIQUE: Multidetector CT imaging of the abdomen and pelvis was performed
following the standard protocol without IV contrast.

[Series 2: routine abd/pel wo · axial · 0.86mm/px · z∈[-1115,-705]mm · 13 of 90 slices shown, 15 images]
[im 4/90  soft-tissue]
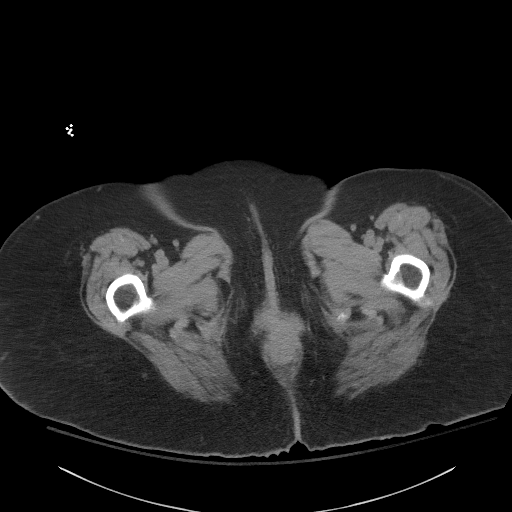
[im 4/90  bone]
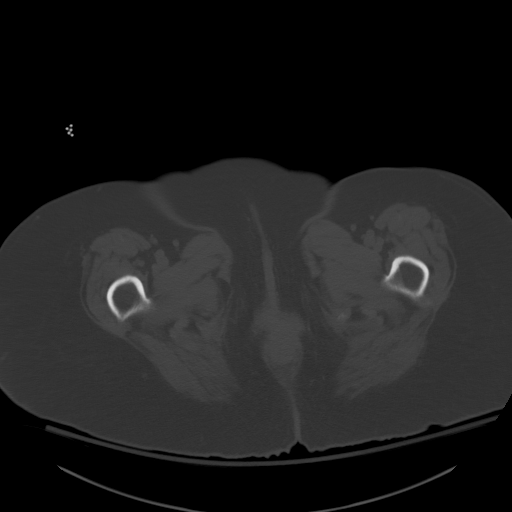
[im 12/90  soft-tissue]
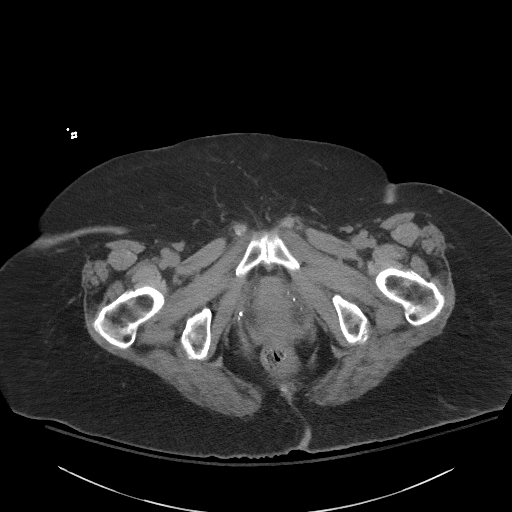
[im 19/90  soft-tissue]
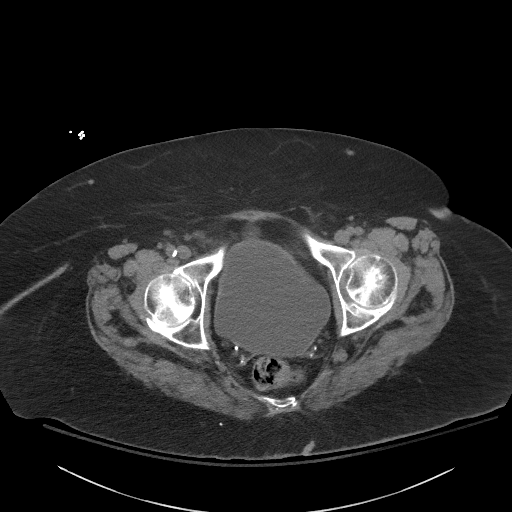
[im 26/90  soft-tissue]
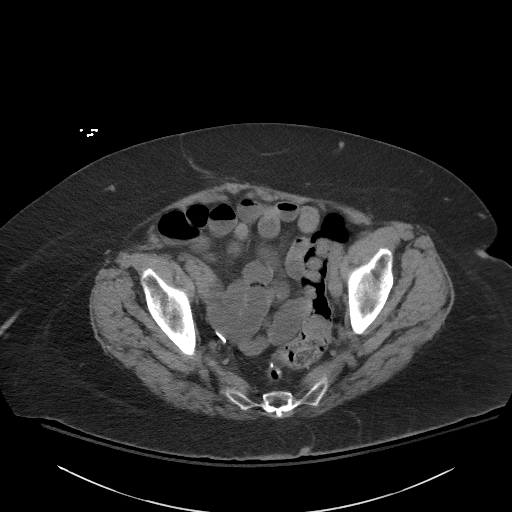
[im 30/90  soft-tissue]
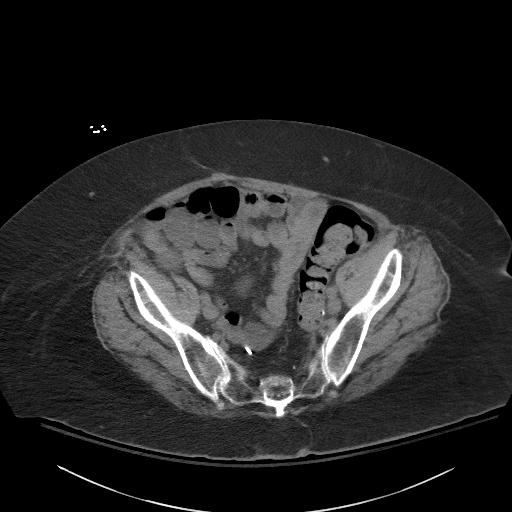
[im 38/90  soft-tissue]
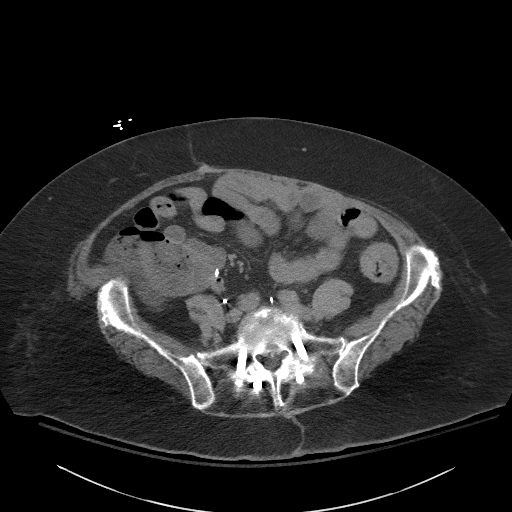
[im 45/90  soft-tissue]
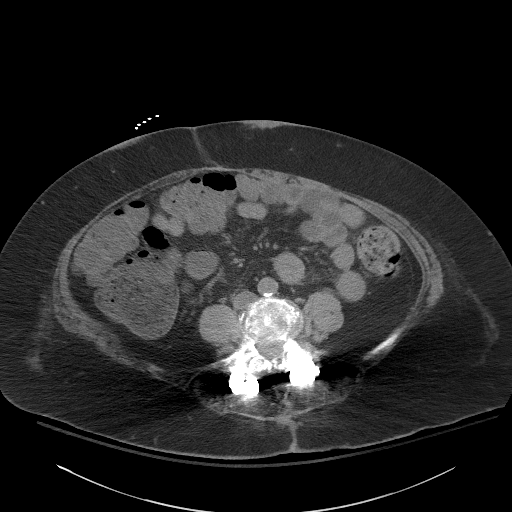
[im 52/90  soft-tissue]
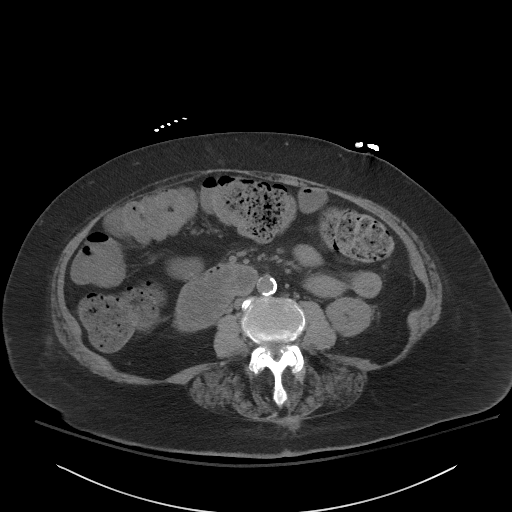
[im 60/90  soft-tissue]
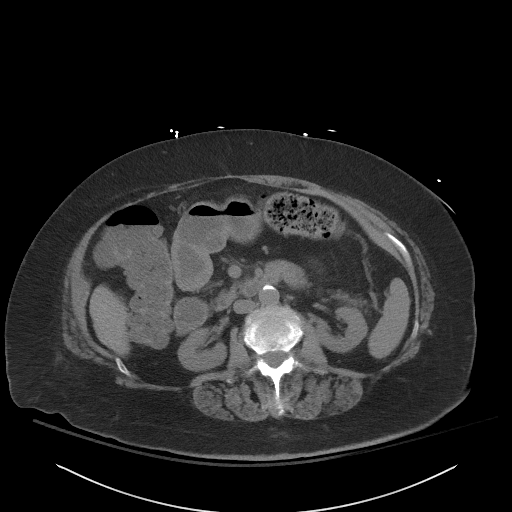
[im 60/90  bone]
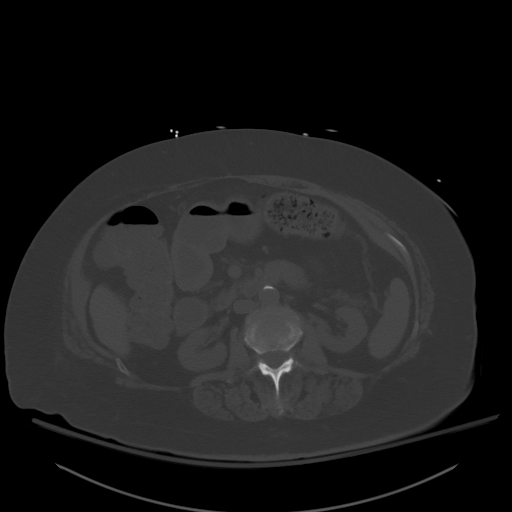
[im 64/90  soft-tissue]
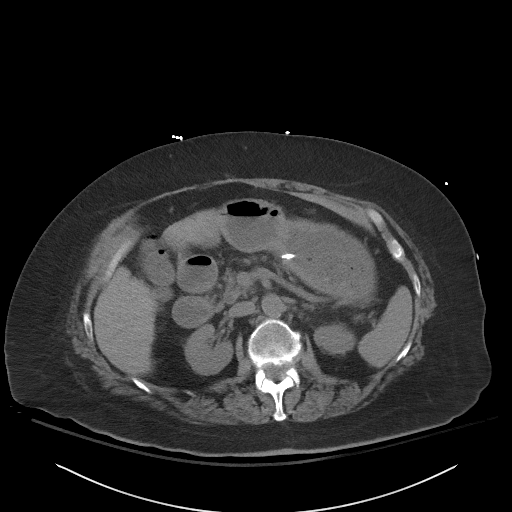
[im 71/90  soft-tissue]
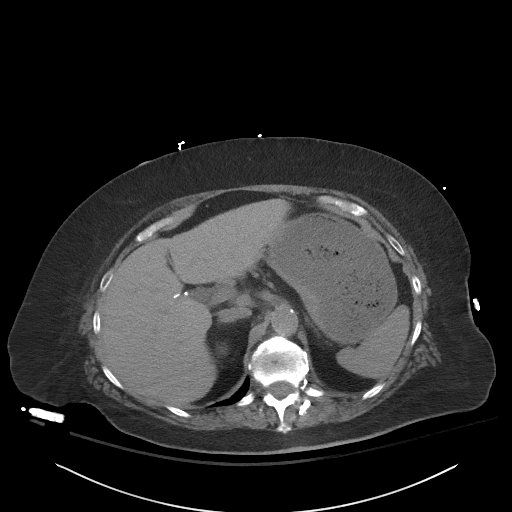
[im 78/90  soft-tissue]
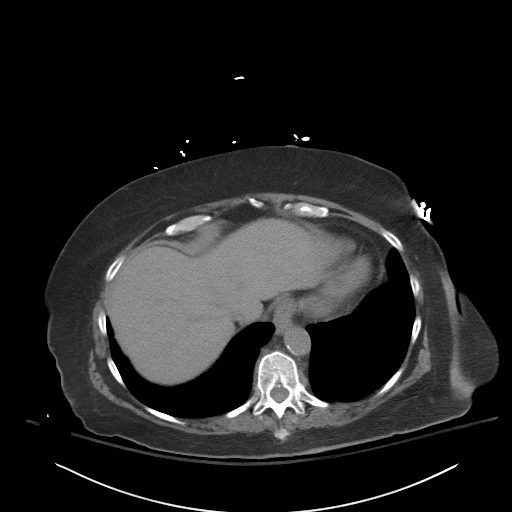
[im 86/90  soft-tissue]
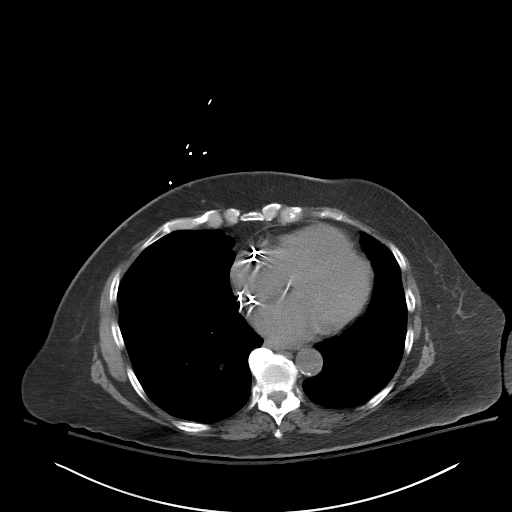

[Series 5: coronal st · coronal · 0.83mm/px · 3 of 96 slices shown]
[im 32/96  soft-tissue]
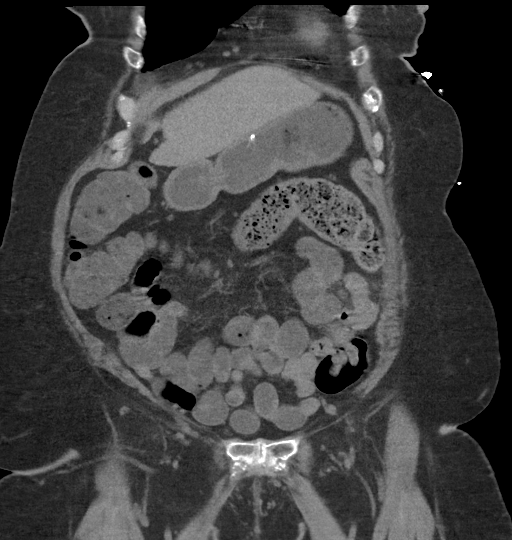
[im 43/96  soft-tissue]
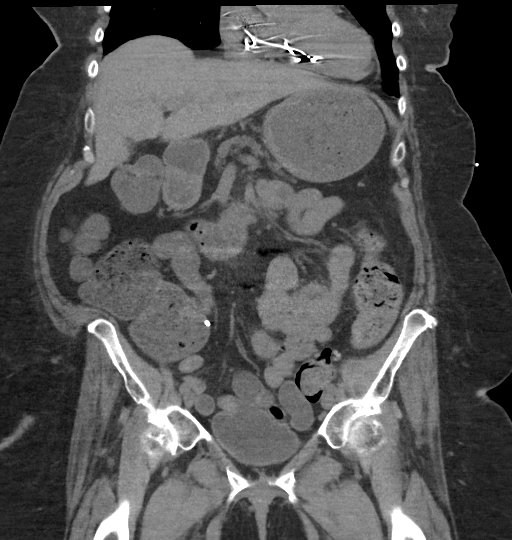
[im 53/96  soft-tissue]
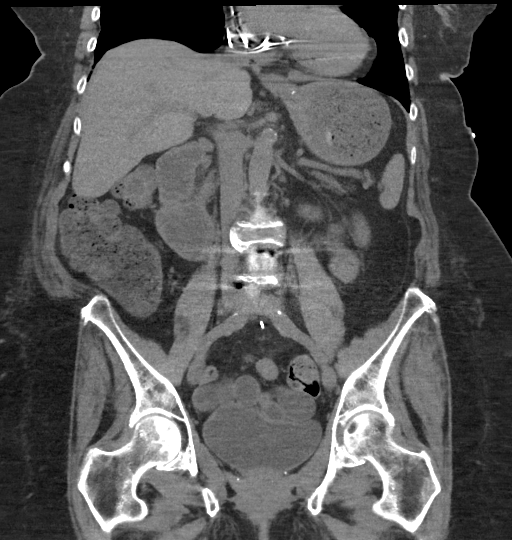

[16 of 46 positions shown; findings below may reference images not displayed]

FINDINGS: Lower chest: Left base scarring. Pacer. Normal heart size without
pericardial or pleural effusion. Tiny hiatal hernia.

Hepatobiliary: Normal liver. Cholecystectomy, without biliary ductal
dilatation.

Pancreas: Fatty replacement/atrophy throughout the pancreas. No duct
dilatation or acute inflammation.

Spleen: Normal in size, without focal abnormality.

Adrenals/Urinary Tract: Normal adrenal glands. Mild renal cortical
thinning bilaterally. No renal calculi or hydronephrosis. No bladder
calculi.

Stomach/Bowel: Normal remainder of the stomach. Colonic stool burden
suggests constipation. Surgical sutures at the rectosigmoid
junction. Normal terminal ileum. Appendectomy per clinical history.
Normal small bowel.

Vascular/Lymphatic: Aortic atherosclerosis. No abdominopelvic
adenopathy.

Reproductive: Hysterectomy.  No adnexal mass.

Other: No significant free fluid. Moderate pelvic floor laxity No
free intraperitoneal air. Presumed injection sites about the
anterior abdominal wall subcutaneous fat.

Musculoskeletal: Degenerative changes of the hips. Lumbar spine
fixation. Osteopenia. L4-S1 trans pedicle screw fixation. Vertebral
augmentation at T12 and L1 with compression deformities and canal
encroachment at T12, similar.
IMPRESSION: 1. Possible constipation.
2.  No acute process in the abdomen or pelvis.
3. Tiny hiatal hernia.
4. Aortic Atherosclerosis ([id number]).

## 2020-06-20 ENCOUNTER — Other Ambulatory Visit: Payer: Self-pay | Admitting: Gastroenterology

## 2020-06-22 ENCOUNTER — Other Ambulatory Visit: Payer: Self-pay | Admitting: Sports Medicine

## 2020-06-22 ENCOUNTER — Other Ambulatory Visit (HOSPITAL_COMMUNITY): Payer: Self-pay | Admitting: Sports Medicine

## 2020-06-22 DIAGNOSIS — W19XXXA Unspecified fall, initial encounter: Secondary | ICD-10-CM

## 2020-06-23 ENCOUNTER — Other Ambulatory Visit: Payer: Self-pay | Admitting: Sports Medicine

## 2020-06-23 DIAGNOSIS — W19XXXA Unspecified fall, initial encounter: Secondary | ICD-10-CM

## 2020-06-23 DIAGNOSIS — M25552 Pain in left hip: Secondary | ICD-10-CM

## 2020-06-24 ENCOUNTER — Other Ambulatory Visit: Payer: Self-pay

## 2020-06-24 ENCOUNTER — Ambulatory Visit
Admission: RE | Admit: 2020-06-24 | Discharge: 2020-06-24 | Disposition: A | Discharge status: Home / Self Care | Payer: Medicare Other | Source: Ambulatory Visit | Attending: Sports Medicine | Admitting: Sports Medicine

## 2020-06-24 DIAGNOSIS — M25552 Pain in left hip: Secondary | ICD-10-CM | POA: Diagnosis present

## 2020-06-30 ENCOUNTER — Ambulatory Visit: Payer: Medicare Other | Admitting: Gastroenterology

## 2020-07-10 ENCOUNTER — Telehealth: Payer: Self-pay | Admitting: Emergency Medicine

## 2020-07-10 DIAGNOSIS — I4891 Unspecified atrial fibrillation: Secondary | ICD-10-CM

## 2020-07-10 DIAGNOSIS — Z01812 Encounter for preprocedural laboratory examination: Secondary | ICD-10-CM

## 2020-07-10 DIAGNOSIS — I480 Paroxysmal atrial fibrillation: Secondary | ICD-10-CM

## 2020-07-10 NOTE — Telephone Encounter (Signed)
Patient unable to send remote transmission from home monitor and will contact [employer] tech support. Patient made aware of the treatment plan of DCCV on 07/13/20 by Dr Caryl Comes. She will expect a call from [place] to provide details about DCCV that will be done at [facility]

## 2020-07-10 NOTE — Telephone Encounter (Signed)
Spoke to pt and her husband. DCCV scheduled for 11/3. Pt to stop by [facility] medical mall Monday for lab work and covid screening. Pt confirms no missed doses of Eliquis in the last month, aware not to stop the medication for next weeks procedure. DCCV instructions reviewed with pt and husband.  Aware NPO after MN night before, take 1/2 her bedtime insulin the night before, hold all medications morning of DCCV, needs driver home and to stay with her that evening. Patient & husband verbalized understanding and agreeable to plan.

## 2020-07-10 NOTE — Addendum Note (Signed)
Addended by: Stanton Kidney on: 07/10/2020 05:38 PM   Modules accepted: Orders

## 2020-07-10 NOTE — Telephone Encounter (Signed)
Patient unavailable to take call. Will return call at later time per spouse. Need to assess s/sx due to ongoing AF/AFL since 07/08/20. Alert today showed presenting as AFL with v-rates 90-105. + OAC , amiodorone 200 mg qd.

## 2020-07-10 NOTE — Telephone Encounter (Signed)
Patient reports that se has felt extremely tired for the last 2 days. She reports that she has not missed any doses of amiodorone. She reports that she has had CP on inspiration in both sides and substernal. She has been diagnosed with broken ribs and has pain in areas of broken ribs. Patient reports increased anxiety, stress, and insomnia due to pulmonary issues and appointments.`Unable to send remote transmission to confirm current rhythm due to monitor issue. Tech support info provided. Patient to call office with results of call to tech support.   Spoke with Dr Caryl Comes and he would like for patient to be set up for cardioversion on 07/13/20.

## 2020-07-13 ENCOUNTER — Other Ambulatory Visit
Admission: RE | Admit: 2020-07-13 | Discharge: 2020-07-13 | Disposition: A | Discharge status: Home / Self Care | Payer: Medicare Other | Source: Ambulatory Visit | Attending: Cardiology | Admitting: Cardiology

## 2020-07-13 ENCOUNTER — Other Ambulatory Visit: Payer: Self-pay

## 2020-07-13 ENCOUNTER — Telehealth: Payer: Self-pay | Admitting: Internal Medicine

## 2020-07-13 DIAGNOSIS — Z20822 Contact with and (suspected) exposure to covid-19: Secondary | ICD-10-CM | POA: Insufficient documentation

## 2020-07-13 DIAGNOSIS — Z01812 Encounter for preprocedural laboratory examination: Secondary | ICD-10-CM | POA: Diagnosis present

## 2020-07-13 LAB — PROTIME-INR
INR: 1.3 — ABNORMAL HIGH (ref 0.8–1.2)
Prothrombin Time: 15.3 seconds — ABNORMAL HIGH (ref 11.4–15.2)

## 2020-07-13 LAB — SARS CORONAVIRUS 2 (TAT 6-24 HRS): SARS Coronavirus 2: NEGATIVE

## 2020-07-13 NOTE — Telephone Encounter (Signed)
Patient c/o Palpitations:  High priority if patient c/o lightheadedness, shortness of breath, or chest pain  1) How long have you had palpitations/irregular HR/ Afib? Are you having the symptoms now? A fib since last Thursday night  2) Are you currently experiencing lightheadedness, SOB or CP? No pain reported  3) Do you have a history of afib (atrial fibrillation) or irregular heart rhythm? yes  4) Have you checked your BP or HR? (document readings if available): HR 47-115   5) Are you experiencing any other symptoms? tiredness  Patient is going to have covid test done today for appt Wednesday this morning. Patient can be reached on home phone for next hour and then the cell phone

## 2020-07-13 NOTE — Telephone Encounter (Signed)
The patient called back. She states her husband called initially to inquire if she might be able to get her DCCV sooner than Wednesday.  I advised her I was out Friday but had reviewed her chart and had seen that she had been in a-fib since ~ last Wednesday, but is currently set up for a DCCV on Wednesday 07/15/20 at [facility].  She currently just feels tired. I advised the DCCV schedule is booked at [facility] until Thursday this week.   The patient states she is agreeable with having her procedure as scheduled on Wednesday 11/3 at [facility].

## 2020-07-13 NOTE — Telephone Encounter (Signed)
Attempted to call the patient at the cell #. No answer- I left a message to please call back.  Attempted to call the patient on the house #. Busy signal received x 2 attempts.

## 2020-07-14 ENCOUNTER — Other Ambulatory Visit: Payer: Self-pay | Admitting: Internal Medicine

## 2020-07-14 DIAGNOSIS — Z1231 Encounter for screening mammogram for malignant neoplasm of breast: Secondary | ICD-10-CM

## 2020-07-15 ENCOUNTER — Ambulatory Visit (HOSPITAL_COMMUNITY): Payer: Medicare Other | Admitting: Anesthesiology

## 2020-07-15 ENCOUNTER — Telehealth: Payer: Self-pay | Admitting: Gastroenterology

## 2020-07-15 ENCOUNTER — Encounter (HOSPITAL_COMMUNITY)
Admission: RE | Disposition: A | Discharge status: Home / Self Care | Payer: Medicare Other | Source: Home / Self Care | Attending: Cardiology

## 2020-07-15 ENCOUNTER — Ambulatory Visit: Payer: Medicare Other | Admitting: Gastroenterology

## 2020-07-15 ENCOUNTER — Ambulatory Visit (HOSPITAL_COMMUNITY)
Admission: RE | Admit: 2020-07-15 | Discharge: 2020-07-15 | Disposition: A | Discharge status: Home / Self Care | Payer: Medicare Other | Attending: Cardiology | Admitting: Cardiology

## 2020-07-15 ENCOUNTER — Encounter (HOSPITAL_COMMUNITY): Payer: Self-pay | Admitting: Cardiology

## 2020-07-15 DIAGNOSIS — Z888 Allergy status to other drugs, medicaments and biological substances status: Secondary | ICD-10-CM | POA: Insufficient documentation

## 2020-07-15 DIAGNOSIS — Z886 Allergy status to analgesic agent status: Secondary | ICD-10-CM | POA: Insufficient documentation

## 2020-07-15 DIAGNOSIS — Z950 Presence of cardiac pacemaker: Secondary | ICD-10-CM | POA: Insufficient documentation

## 2020-07-15 DIAGNOSIS — I10 Essential (primary) hypertension: Secondary | ICD-10-CM | POA: Insufficient documentation

## 2020-07-15 DIAGNOSIS — E119 Type 2 diabetes mellitus without complications: Secondary | ICD-10-CM | POA: Insufficient documentation

## 2020-07-15 DIAGNOSIS — Z885 Allergy status to narcotic agent status: Secondary | ICD-10-CM | POA: Insufficient documentation

## 2020-07-15 DIAGNOSIS — R001 Bradycardia, unspecified: Secondary | ICD-10-CM | POA: Insufficient documentation

## 2020-07-15 DIAGNOSIS — Z6833 Body mass index (BMI) 33.0-33.9, adult: Secondary | ICD-10-CM | POA: Insufficient documentation

## 2020-07-15 DIAGNOSIS — I2510 Atherosclerotic heart disease of native coronary artery without angina pectoris: Secondary | ICD-10-CM | POA: Insufficient documentation

## 2020-07-15 DIAGNOSIS — I4819 Other persistent atrial fibrillation: Secondary | ICD-10-CM | POA: Insufficient documentation

## 2020-07-15 DIAGNOSIS — Z91013 Allergy to seafood: Secondary | ICD-10-CM | POA: Diagnosis not present

## 2020-07-15 DIAGNOSIS — Z882 Allergy status to sulfonamides status: Secondary | ICD-10-CM | POA: Insufficient documentation

## 2020-07-15 DIAGNOSIS — Z7901 Long term (current) use of anticoagulants: Secondary | ICD-10-CM | POA: Diagnosis not present

## 2020-07-15 DIAGNOSIS — I484 Atypical atrial flutter: Secondary | ICD-10-CM | POA: Diagnosis not present

## 2020-07-15 DIAGNOSIS — I4892 Unspecified atrial flutter: Secondary | ICD-10-CM | POA: Insufficient documentation

## 2020-07-15 HISTORY — PX: CARDIOVERSION: SHX1299

## 2020-07-15 LAB — POCT I-STAT, CHEM 8
BUN: 27 mg/dL — ABNORMAL HIGH (ref 8–23)
Calcium, Ion: 1.14 mmol/L — ABNORMAL LOW (ref 1.15–1.40)
Chloride: 102 mmol/L (ref 98–111)
Creatinine, Ser: 1.9 mg/dL — ABNORMAL HIGH (ref 0.44–1.00)
Glucose, Bld: 94 mg/dL (ref 70–99)
HCT: 36 % (ref 36.0–46.0)
Hemoglobin: 12.2 g/dL (ref 12.0–15.0)
Potassium: 4.3 mmol/L (ref 3.5–5.1)
Sodium: 142 mmol/L (ref 135–145)
TCO2: 24 mmol/L (ref 22–32)

## 2020-07-15 SURGERY — CARDIOVERSION
Anesthesia: General

## 2020-07-15 MED ORDER — LIDOCAINE 2% (20 MG/ML) 5 ML SYRINGE
INTRAMUSCULAR | Status: DC | PRN
  Administered 2020-07-15: 40 mg via INTRAVENOUS

## 2020-07-15 MED ORDER — PROPOFOL 10 MG/ML IV BOLUS
INTRAVENOUS | Status: DC | PRN
  Administered 2020-07-15: 50 mg via INTRAVENOUS

## 2020-07-15 MED ORDER — SODIUM CHLORIDE 0.9 % IV SOLN: INTRAVENOUS | Status: DC | PRN

## 2020-07-15 NOTE — Anesthesia Procedure Notes (Signed)
Procedure Name: General with mask airway Date/Time: 07/15/2020 9:27 AM Performed by: Kyung Rudd, CRNA Pre-anesthesia Checklist: Patient identified, Emergency Drugs available, Suction available and Patient being monitored Patient Re-evaluated:Patient Re-evaluated prior to induction Oxygen Delivery Method: Ambu bag Preoxygenation: Pre-oxygenation with 100% oxygen Induction Type: IV induction Ventilation: Mask ventilation without difficulty Placement Confirmation: positive ETCO2 Dental Injury: Teeth and Oropharynx as per pre-operative assessment

## 2020-07-15 NOTE — H&P (Signed)
Cardiology Admission History and Physical:   Patient ID: ARLISS HEPBURN MRN: [id number]; DOB: [birth year]   Admission date: 07/15/2020  Primary Care Provider: [name], MD [facility] Cardiologist: [name], MD    History of Present Illness:   Ms. Vandenheuvel here for cardioversion at the request of Dr. Jolyn Nap who has communicated with the patient.  She has had symptoms of atrial fibrillation since last Thursday night.  Heart rates have been ranging from 47-115. Been feeling winded, SOB. No CP.   She has diabetes with hypertension as well as morbid obesity and bradycardia.  She has a past medical history of coronary artery disease but not having any anginal symptoms.  Has had pacemaker placement. Has been on amiodarone 200 mg a day as well.   Past Medical History:  Diagnosis Date  . A-fib ([place]) 2016  . Acid reflux   . Anemia   . Anxiety   . Bowel obstruction ([place])   . Carotid artery disease ([place])   . CHF (congestive heart failure) ([place]) 2017  . Chronic kidney disease (CKD) stage G3a/A1, moderately decreased glomerular filtration rate (GFR) between 45-59 mL/min/1.73 square meter and albuminuria creatinine ratio less than 30 mg/g (HCC)    stage IV  . Coronary artery disease   . Depression   . Diabetes mellitus without complication ([place])   . Dyspnea   . Dysrhythmia    PAROXYSMAL ATRIAL FIBRILLATION  . Fatty liver   . GI bleed   . Gout   . Hypertension   . Hypothyroidism   . Low back pain    history of kyphoplasty t12-l1  . Morbid obesity ([place])   . Multiple gastric polyps    history of gi bleed.  polyps removed  . Neuromuscular disorder ([place])    diabetic neuropathy  . Osteoarthritis   . Osteoporosis   . Paroxysmal atrial fibrillation (HCC)   . Presence of permanent cardiac pacemaker   . Sinus node dysfunction ([place]) 08/24/2017  . Stroke [facility])    TIA, patient unaware that she had it. no residual  . TIA (transient ischemic attack)     Past  Surgical History:  Procedure Laterality Date  . ABDOMINAL HYSTERECTOMY  1980  . APPENDECTOMY    . BACK SURGERY     2008. plate & screws   . BACK SURGERY  2019   patient describes kyphoplasty for compression fractures, MD referral said fusion  . BREAST BIOPSY Right 08/16   stereo fibroadenomatous change, neg for atypia  . BREAST EXCISIONAL BIOPSY Left    neg  . CARDIAC CATHETERIZATION Left 08/25/2016   Procedure: Left Heart Cath and Coronary Angiography;  Surgeon: Corey , MD;  Location: [place] CV LAB;  Service: Cardiovascular;  Laterality: Left;  . CARDIOVERSION N/A 06/28/2017   Procedure: CARDIOVERSION;  Surgeon: Corey , MD;  Location: [facility] OR[facility];  Service: Cardiovascular;  Laterality: N/A;  . CARDIOVERSION N/A 02/06/2018   Procedure: CARDIOVERSION;  Surgeon: Josue Hector, MD;  Location: [place];  Service: Cardiovascular;  Laterality: N/A;  . CATARACT EXTRACTION, BILATERAL  2012  . CHOLECYSTECTOMY    . colon blockage  1999  . COLON SURGERY  2000   removed 20% of colon. colon had collapsed  . COLONOSCOPY  2016   polyps removed 2016  . DORSAL COMPARTMENT RELEASE Left 09/14/2016   Procedure: RELEASE DORSAL COMPARTMENT (DEQUERVAIN);  Surgeon: Dereck Leep, MD;  Location: [facility] OR[facility];  Service: Orthopedics;  Laterality: Left;  . ESOPHAGOGASTRODUODENOSCOPY N/A  01/27/2015   Procedure: ESOPHAGOGASTRODUODENOSCOPY (EGD);  Surgeon: Lollie Sails, MD;  Location[facility] END[facility]COPY;  Service: Endoscopy;  Laterality: N/A;  . ESOPHAGOGASTRODUODENOSCOPY (EGD) WITH PROPOFOL N/A 07/24/2015   Procedure: ESOPHAGOGASTRODUODENOSCOPY (EGD) WITH PROPOFOL;  Surgeon: Lucilla Lame, MD;  Location: [facility] ENDOSCOPY;  Service: Endoscopy;  Laterality: N/A;  . ESOPHAGOGASTRODUODENOSCOPY (EGD) WITH PROPOFOL N/A 04/12/2018   Procedure: ESOPHAGOGASTRODUODENOSCOPY (EGD) WITH PROPOFOL;  Surgeon: Jonathon Bellows, MD;  Location: [facility] ENDOSCOPY;  Service: Gastroenterology;  Laterality: N/A;  .  ESOPHAGOGASTRODUODENOSCOPY (EGD) WITH PROPOFOL N/A 06/22/2018   Procedure: ESOPHAGOGASTRODUODENOSCOPY (EGD) WITH PROPOFOL;  Surgeon: [name], MD;  Location: [facility] ENDOSCOPY;  Service: Endoscopy;  Laterality: N/A;  . ESOPHAGOGASTRODUODENOSCOPY (EGD) WITH PROPOFOL N/A 07/09/2018   Procedure: ESOPHAGOGASTRODUODENOSCOPY (EGD) WITH PROPOFOL with resection of gastric polyps;  Surgeon: Jonathon Bellows, MD;  Location: [facility] ENDOSCOPY;  Service: Gastroenterology;  Laterality: N/A;  . ESOPHAGOGASTRODUODENOSCOPY (EGD) WITH PROPOFOL N/A 05/17/2019   Procedure: ESOPHAGOGASTRODUODENOSCOPY (EGD) WITH PROPOFOL;  Surgeon: Jonathon Bellows, MD;  Location: [facility] ENDOSCOPY;  Service: Gastroenterology;  Laterality: N/A;  . EUS N/A 06/22/2018   Procedure: UPPER ENDOSCOPIC ULTRASOUND (EUS) RADIAL;  Surgeon: [name], MD;  Location: [facility] ENDOSCOPY;  Service: Endoscopy;  Laterality: N/A;  . EYE SURGERY Bilateral    cataract extractions  . JOINT REPLACEMENT Bilateral 2014   Bilateral Knee replacement  . LEAD REVISION/REPAIR N/A 08/29/2017   Procedure: LEAD REVISION/REPAIR;  Surgeon: Constance Haw, MD;  Location: [place] CV LAB;  Service: Cardiovascular;  Laterality: N/A;  . OOPHORECTOMY    . PACEMAKER INSERTION Left 07/01/2015   Procedure: INSERTION PACEMAKER;  Surgeon: Isaias Cowman, MD;  Location: [facility];  Service: Cardiovascular;  Laterality: Left;  . PACEMAKER REVISION N/A 08/28/2017   Procedure: PACEMAKER REVISION;  Surgeon: Deboraha Sprang, MD;  Location: [place] CV LAB;  Service: Cardiovascular;  Laterality: N/A;  . REPLACEMENT TOTAL KNEE BILATERAL    . SHOULDER ARTHROSCOPY WITH ROTATOR CUFF REPAIR AND OPEN BICEPS TENODESIS Left 11/21/2019   Procedure: LEFT SHOULDER ARTHROSCOPY WITH DEBRIDEMENT, DECOMPRESSION, ROTATOR CUFF REPAIR AND BICEPS TENOLYSIS;  Surgeon: Corky Mull, MD;  Location: [facility];  Service: Orthopedics;  Laterality: Left;  . SHOULDER ARTHROSCOPY WITH SUBACROMIAL  DECOMPRESSION Left 2013  . TEE WITHOUT CARDIOVERSION N/A 06/28/2017   Procedure: TRANSESOPHAGEAL ECHOCARDIOGRAM (TEE);  Surgeon: Corey , MD;  Location: [facility] OR[facility];  Service: Cardiovascular;  Laterality: N/A;     Medications Prior to Admission: Prior to Admission medications   Medication Sig Start Date End Date Taking? Authorizing Provider  allopurinol (ZYLOPRIM) 100 MG tablet Take 150 mg by mouth daily.    Yes [provider]  amiodarone (PACERONE) 200 MG tablet Take 1 tablet (200 mg total) by mouth daily. 05/26/20  Yes Deboraha Sprang, MD  apixaban (ELIQUIS) 5 MG TABS tablet Take 5 mg by mouth 2 (two) times daily.   Yes [provider]  busPIRone (BUSPAR) 10 MG tablet Take 10 mg by mouth 2 (two) times daily.    Yes [provider]  cholecalciferol (QC VITAMIN D3) 10 MCG (400 UNIT) TABS tablet Take 400 Units by mouth in the morning and at bedtime.   Yes [provider]  colchicine 0.6 MG tablet Take 0.6 mg by mouth daily as needed (gout).    Yes [provider]  docusate sodium (COLACE) 100 MG capsule Take 200 mg by mouth 2 (two) times daily.   Yes [provider]  DULoxetine (CYMBALTA) 30 MG capsule Take 30 mg by mouth  daily.  03/25/19 07/13/21 Yes [provider]  furosemide (LASIX) 20 MG tablet Take 20 mg by mouth every other day. In the morning   Yes [provider]  gabapentin (NEURONTIN) 600 MG tablet Take 1,200 mg by mouth 3 (three) times daily.    Yes [provider]  HYDROcodone-acetaminophen (NORCO) 10-325 MG tablet Take 1 tablet by mouth every 4 (four) hours as needed for moderate pain. Patient taking differently: Take 1 tablet by mouth every 6 (six) hours as needed for moderate pain.  11/21/19  Yes Poggi, Marshall Cork, MD  insulin NPH-regular Human (NOVOLIN 70/30) (70-30) 100 UNIT/ML injection Inject 25 Units into the skin 2 (two) times daily before a meal. Before breakfast & before supper   Yes [provider]  ketoconazole (NIZORAL) 2 % cream Apply 1 application topically daily as needed (rash/skin irritation.).    Yes [provider]  levothyroxine (SYNTHROID) 50 MCG tablet Take 50 mcg by mouth daily before breakfast.   Yes [provider]  oxybutynin (DITROPAN) 5 MG tablet Take 10 mg by mouth 2 (two) times daily. Breakfast & bedtime   Yes [provider]  pantoprazole (PROTONIX) 40 MG tablet Take 1 tablet by mouth once daily Patient taking differently: Take 40 mg by mouth daily with breakfast.  06/22/20  Yes Jonathon Bellows, MD  Polyethyl Glycol-Propyl Glycol (SYSTANE) 0.4-0.3 % SOLN Place 1-2 drops into both eyes 4 (four) times daily as needed (dry eyes).    Yes [provider]  pravastatin (PRAVACHOL) 20 MG tablet Take 20 mg by mouth at bedtime.  07/25/18  Yes [provider]  talc (ZEASORB) powder Apply 1 application topically as needed (under the breast irritation).   Yes [provider]  traZODone (DESYREL) 50 MG tablet Take 50 mg by mouth at bedtime.  06/07/18  Yes [provider]  vitamin B-12 (CYANOCOBALAMIN) 1000 MCG tablet Take 1,000 mcg by mouth daily.   Yes [provider]  vitamin C (ASCORBIC ACID) 500 MG tablet Take 500 mg by mouth 2 (two) times daily.   Yes [provider]     Allergies:    Allergies  Allergen Reactions  . Fish Allergy Shortness Of Breath    All kinds of fish. Severe vomitting even with smell of fish.   . Macrolides And Ketolides Other (See Comments)    Severe stomach pain (mycins)  . Meperidine Shortness Of Breath, Nausea Only and Other (See Comments)    Stomach pain   . Other Nausea Only, Swelling, Rash, Anaphylaxis, Diarrhea, Shortness Of Breath and Other (See Comments)    Allergen: NON-STEROIDS, bextra - hands and feet swell  Reaction:  Stomach pain   Other Reaction: Intolerance  . Prednisone Anaphylaxis and Other (See Comments)    (facial  swelling/redness/burning)Increases pts blood sugar  Pt states that she is allergic to all steroids.    . Shellfish Allergy Anaphylaxis, Shortness Of Breath, Diarrhea, Nausea Only and Rash    Stomach pain   . Sulfa Antibiotics Shortness Of Breath, Diarrhea, Nausea Only and Other (See Comments)    Reaction:  Stomach pain   Reaction: Stomach pain  Other Reaction: Intolerance  . Sulfacetamide Sodium Diarrhea, Nausea Only, Other (See Comments) and Shortness Of Breath    Reaction:  Stomach pain   . Telbivudine Other (See Comments)    Unknown reaction  . Uloric [Febuxostat] Anaphylaxis    Locks pt's body up   . Aspirin Rash and Other (See Comments)  Stomach pain (tolerates lower doses)  . Celecoxib Other (See Comments)    GI bleed, weakness, and stomach pain.    . Cephalexin Diarrhea, Nausea Only and Other (See Comments)    stomach pain   . Codeine Diarrhea, Nausea Only and Other (See Comments)    Reaction:  Stomach pain Pt tolerates morphine   . Dilaudid [Hydromorphone Hcl] Other (See Comments)    Reactions: easy to overdose - blood pressure drops really low  . Erythromycin Diarrhea, Nausea Only and Other (See Comments)    Reaction:  Stomach pain   . Iodinated Diagnostic Agents Rash and Other (See Comments)    Pt states that she is unable to have because she has chronic kidney disease.   CKD Pt states that she is unable to have because she has chronic kidney disease.  CKD   . Lipitor [Atorvastatin Calcium] Nausea Only    Reaction: nausea, weakness, pass blood  . Oxycodone Other (See Comments)    Reaction:  Stomach pain   . Aleve [Naproxen]     Reaction: severe stomach pain  . Atorvastatin Nausea Only and Other (See Comments)    Weakness   . Cephalosporins Other (See Comments)    Reaction:  Unknown   . Doxycycline     Stomach pain   . Iodine Rash  . Motrin [Ibuprofen]     Reaction: severe stomach pain  . Tape Other (See Comments)    Causes pts skin to tear  Pt states  that she is able to use paper tape.      . Valdecoxib Swelling and Other (See Comments)    Pt states that her hands and feet swell.      Social History:   Social History   Socioeconomic History  . Marital status: Married    Spouse name: [name]  . Number of children: Not on file  . Years of education: Not on file  . Highest education level: Not on file  Occupational History  . 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 Smoking status: Never Smoker  . Smokeless tobacco: Never Used  Vaping Use  . Vaping Use: Never used  Substance and Sexual Activity  . Alcohol use: No  . Drug use: No  . Sexual activity: Yes  Other Topics Concern  . Not on file  Social History Narrative   DIABETIC   PACEMAKER   Social Determinants of Health   Financial Resource Strain:   . Difficulty of Paying Living Expenses: Not on file  Food Insecurity:   . Worried About Charity fundraiser in the Last Year: Not on file  . Ran Out of Food in the Last Year: Not on file  Transportation Needs:   . Lack of Transportation (Medical): Not on file  . Lack of Transportation (Non-Medical): Not on file  Physical Activity:   . Days of Exercise per Week: Not on file  . Minutes of Exercise per Session: Not on file  Stress:   . Feeling of Stress : Not on file  Social Connections:   . Frequency of Communication with Friends and Family: Not on file  . Frequency of Social Gatherings with Friends and Family: Not on file  . Attends Religious Services: Not on file  . Active Member of Clubs or Organizations: Not on file  . Attends Archivist Meetings: Not on file  . Marital Status: Not on file  Intimate Partner Violence:   .  Fear of Current or Ex-Partner: Not on file  . Emotionally Abused: Not on file  . Physically Abused: Not on file  . Sexually Abused: Not on file    Family History:   The patient's family history includes CAD in her mother; Cancer in her father; Diabetes  Mellitus II in her mother; Heart attack in her mother. There is no history of Breast cancer.    ROS:  Please see the history of present illness.  No bleeding, no syncope. All other ROS reviewed and negative.     Physical Exam/Data:   Vitals:   07/15/20 0746  BP: (!) 114/48  Pulse: (!) 111  Resp: (!) 22  Temp: 97.7 F (36.5 C)  TempSrc: Oral  Weight: 89.4 kg  Height: 5\' 4"  (1.626 m)   No intake or output data in the 24 hours ending 07/15/20 0800 Last 3 Weights 07/15/2020 03/31/2020 12/29/2019  Weight (lbs) 197 lb 203 lb 212 lb  Weight (kg) 89.359 kg 92.08 kg 96.163 kg     Body mass index is 33.81 kg/m.  General:  Well nourished, well developed, in no acute distress HEENT: normal Lymph: no adenopathy Neck: no JVD Endocrine:  No thryomegaly Cardiac:  normal S1, S2; Irreg; no murmur  Lungs:  clear to auscultation bilaterally, no wheezing, rhonchi or rales  Abd: soft, nontender, no hepatomegaly  Ext: no edema Musculoskeletal:  No deformities, BUE and BLE strength normal and equal Skin: warm and dry  Neuro:  CNs 2-12 intact, no focal abnormalities noted Psych:  Normal affect    EKG:  03/31/2020 showed atrial paced rhythm rate 64 bpm.  Relevant CV Studies: Echocardiogram 04/10/2018:   - Left ventricle: The cavity size was normal. Wall thickness was  normal. Systolic function was normal. The estimated ejection  fraction was in the range of 60% to 65%.  - Mitral valve: Moderately thickened, mildly calcified leaflets .  - Left atrium: The atrium was mildly dilated.  - Right ventricle: The cavity size was mildly to moderately  dilated. Wall thickness was mildly increased.  - Right atrium: The atrium was mildly dilated.  - Tricuspid valve: There was moderate regurgitation.  - Pulmonary arteries: Systolic pressure was moderately increased,  estimated to be 50 mm Hg plus central venous/right atrial  pressure.   Laboratory Data:  High Sensitivity Troponin:  No  results for input(s): TROPONINIHS in the last 720 hours.    ChemistryNo results for input(s): NA, K, CL, CO2, GLUCOSE, BUN, CREATININE, CALCIUM, GFRNONAA, GFRAA, ANIONGAP in the last 168 hours.  No results for input(s): PROT, ALBUMIN, AST, ALT, ALKPHOS, BILITOT in the last 168 hours. HematologyNo results for input(s): WBC, RBC, HGB, HCT, MCV, MCH, MCHC, RDW, PLT in the last 168 hours. BNPNo results for input(s): BNP, PROBNP in the last 168 hours.  DDimer No results for input(s): DDIMER in the last 168 hours.   Radiology/Studies:  No results found.   Assessment and Plan:   Persistent atrial fibrillation/atrial flutter. -Presents today for cardioversion.  Risks and benefits have been explained.  She is willing to proceed.  He has not missed any doses of Eliquis.  Previously on warfarin. -She underwent dual-chamber pacemaker upgrade 12/18.  Underwent RA lead revision. -We will utilize [employer] device interrogation.  GI bleeding in August 2019 -Continue to monitor.  No melena.  Amiodarone use -Has been seen by pulmonary medicine.   For questions or updates, please contact [place] Please consult [email or web address] for contact info under     Signed,  [name], MD  07/15/2020 8:00 AM

## 2020-07-15 NOTE — Transfer of Care (Signed)
Immediate Anesthesia Transfer of Care Note  Patient: Deborah Obrien  Procedure(s) Performed: CARDIOVERSION (N/A )  Patient Location: Endoscopy Unit  Anesthesia Type:General  Level of Consciousness: awake, alert  and oriented  Airway & Oxygen Therapy: Patient Spontanous Breathing  Post-op Assessment: Report given to RN and Patient moving all extremities X 4  Post vital signs: Reviewed and stable  Last Vitals:  Vitals Value Taken Time  BP    Temp    Pulse    Resp    SpO2      Last Pain:  Vitals:   07/15/20 0746  TempSrc: Oral  PainSc: 2          Complications: No complications documented.

## 2020-07-15 NOTE — Anesthesia Postprocedure Evaluation (Signed)
Anesthesia Post Note  Patient: Deborah Obrien  Procedure(s) Performed: CARDIOVERSION (N/A )     Patient location during evaluation: PACU Anesthesia Type: General Level of consciousness: awake and alert Pain management: pain level controlled Vital Signs Assessment: post-procedure vital signs reviewed and stable Respiratory status: spontaneous breathing, nonlabored ventilation, respiratory function stable and patient connected to nasal cannula oxygen Cardiovascular status: blood pressure returned to baseline and stable Postop Assessment: no apparent nausea or vomiting Anesthetic complications: no   No complications documented.  Last Vitals:  Vitals:   07/15/20 0940 07/15/20 0950  BP: (!) 116/59 (!) 124/44  Pulse:    Resp:    Temp:      Last Pain:  Vitals:   07/15/20 0933  TempSrc: Oral  PainSc: 2                  Effie Berkshire

## 2020-07-15 NOTE — CV Procedure (Signed)
° ° °  Electrical Cardioversion Procedure Note Deborah Obrien [id number] June 03, 1941  Procedure: Electrical Cardioversion Indications:  Atrial Flutter  Time Out: Verified patient identification, verified procedure,medications/allergies/relevent history reviewed, required imaging and test results available.  Performed  Procedure Details  The patient was NPO after midnight. Anesthesia was administered at the beside  by Dr.Hollis with  propofol.  Cardioversion was performed with synchronized biphasic defibrillation via AP pads with 75 joules.  1 attempt(s) were performed.  The patient converted to normal sinus rhythm. The patient tolerated the procedure well   IMPRESSION:  Successful cardioversion of atrial fibrillation Medtronic device interrogation illustrated atrial pacing/ventricular pacing post procedure.    [name] 07/15/2020, 9:34 AM

## 2020-07-15 NOTE — Anesthesia Preprocedure Evaluation (Addendum)
Anesthesia Evaluation  Patient identified by MRN, date of birth, ID band Patient awake    Reviewed: Allergy & Precautions, NPO status , Patient's Chart, lab work & pertinent test results  Airway Mallampati: I  TM Distance: <3 FB Neck ROM: Full    Dental  (+) Edentulous Upper, Partial Lower   Pulmonary neg pulmonary ROS,    breath sounds clear to auscultation       Cardiovascular hypertension, Pt. on medications + CAD and +CHF  + dysrhythmias Atrial Fibrillation + pacemaker  Rhythm:Irregular Rate:Abnormal     Neuro/Psych PSYCHIATRIC DISORDERS Anxiety Depression TIA Neuromuscular disease CVA    GI/Hepatic Neg liver ROS, GERD  Medicated,  Endo/Other  diabetes, Type 2, Insulin DependentHypothyroidism   Renal/GU      Musculoskeletal  (+) Arthritis ,   Abdominal Normal abdominal exam  (+)   Peds  Hematology   Anesthesia Other Findings   Reproductive/Obstetrics                            Anesthesia Physical Anesthesia Plan  ASA: III  Anesthesia Plan: General   Post-op Pain Management:    Induction: Intravenous  PONV Risk Score and Plan: 0 and Propofol infusion  Airway Management Planned: Natural Airway and Simple Face Mask  Additional Equipment: None  Intra-op Plan:   Post-operative Plan:   Informed Consent: I have reviewed the patients History and Physical, chart, labs and discussed the procedure including the risks, benefits and alternatives for the proposed anesthesia with the patient or authorized representative who has indicated his/her understanding and acceptance.       Plan Discussed with: CRNA  Anesthesia Plan Comments: (Covid-19 Nucleic Acid Test Results Lab Results      Component                Value               Date                      SARSCOV2NAA              NEGATIVE            07/13/2020                [place]              NEGATIVE            11/19/2019                 [place]              NEGATIVE            05/14/2019            Echo:  - Left ventricle: The cavity size was normal. Wall thickness was  normal. Systolic function was normal. The estimated ejection  fraction was in the range of 60% to 65%.  - Mitral valve: Moderately thickened, mildly calcified leaflets .  - Left atrium: The atrium was mildly dilated.  - Right ventricle: The cavity size was mildly to moderately  dilated. Wall thickness was mildly increased.  - Right atrium: The atrium was mildly dilated.  - Tricuspid valve: There was moderate regurgitation.  - Pulmonary arteries: Systolic pressure was moderately increased,  estimated to be 50 mm Hg plus central venous/right atrial  pressure. )       Anesthesia Quick Evaluation

## 2020-07-15 NOTE — Discharge Instructions (Signed)
Electrical Cardioversion Electrical cardioversion is the delivery of a jolt of electricity to restore a normal rhythm to the heart. A rhythm that is too fast or is not regular keeps the heart from pumping well. In this procedure, sticky patches or metal paddles are placed on the chest to deliver electricity to the heart from a device. This procedure may be done in an emergency if:  There is low or no blood pressure as a result of the heart rhythm.  Normal rhythm must be restored as fast as possible to protect the brain and heart from further damage.  It may save a life. This may also be a scheduled procedure for irregular or fast heart rhythms that are not immediately life-threatening. Tell a health care provider about:  Any allergies you have.  All medicines you are taking, including vitamins, herbs, eye drops, creams, and over-the-counter medicines.  Any problems you or family members have had with anesthetic medicines.  Any blood disorders you have.  Any surgeries you have had.  Any medical conditions you have.  Whether you are pregnant or may be pregnant. What are the risks? Generally, this is a safe procedure. However, problems may occur, including:  Allergic reactions to medicines.  A blood clot that breaks free and travels to other parts of your body.  The possible return of an abnormal heart rhythm within hours or days after the procedure.  Your heart stopping (cardiac arrest). This is rare. What happens before the procedure? Medicines  Your health care provider may have you start taking: ? Blood-thinning medicines (anticoagulants) so your blood does not clot as easily. ? Medicines to help stabilize your heart rate and rhythm.  Ask your health care provider about: ? Changing or stopping your regular medicines. This is especially important if you are taking diabetes medicines or blood thinners. ? Taking medicines such as aspirin and ibuprofen. These medicines can  thin your blood. Do not take these medicines unless your health care provider tells you to take them. ? Taking over-the-counter medicines, vitamins, herbs, and supplements. General instructions  Follow instructions from your health care provider about eating or drinking restrictions.  Plan to have someone take you home from the hospital or clinic.  If you will be going home right after the procedure, plan to have someone with you for 24 hours.  Ask your health care provider what steps will be taken to help prevent infection. These may include washing your skin with a germ-killing soap. What happens during the procedure?   An IV will be inserted into one of your veins.  Sticky patches (electrodes) or metal paddles may be placed on your chest.  You will be given a medicine to help you relax (sedative).  An electrical shock will be delivered. The procedure may vary among health care providers and hospitals. What can I expect after the procedure?  Your blood pressure, heart rate, breathing rate, and blood oxygen level will be monitored until you leave the hospital or clinic.  Your heart rhythm will be watched to make sure it does not change.  You may have some redness on the skin where the shocks were given. Follow these instructions at home:  Do not drive for 24 hours if you were given a sedative during your procedure.  Take over-the-counter and prescription medicines only as told by your health care provider.  Ask your health care provider how to check your pulse. Check it often.  Rest for 48 hours after the procedure or   as told by your health care provider.  Avoid or limit your caffeine use as told by your health care provider.  Keep all follow-up visits as told by your health care provider. This is important. Contact a health care provider if:  You feel like your heart is beating too quickly or your pulse is not regular.  You have a serious muscle cramp that does not go  away. Get help right away if:  You have discomfort in your chest.  You are dizzy or you feel faint.  You have trouble breathing or you are short of breath.  Your speech is slurred.  You have trouble moving an arm or leg on one side of your body.  Your fingers or toes turn cold or blue. Summary  Electrical cardioversion is the delivery of a jolt of electricity to restore a normal rhythm to the heart.  This procedure may be done right away in an emergency or may be a scheduled procedure if the condition is not an emergency.  Generally, this is a safe procedure.  After the procedure, check your pulse often as told by your health care provider. This information is not intended to replace advice given to you by your health care provider. Make sure you discuss any questions you have with your health care provider. Document Revised: 04/01/2019 Document Reviewed: 04/01/2019 Elsevier Patient Education  2020 [employer]vier Inc.  

## 2020-07-15 NOTE — Telephone Encounter (Signed)
Patient rescheduled 07/15/20 ,due to having an outpatient heart procedure today.

## 2020-07-17 ENCOUNTER — Encounter (HOSPITAL_COMMUNITY): Payer: Self-pay | Admitting: Cardiology

## 2020-08-20 ENCOUNTER — Other Ambulatory Visit: Payer: Self-pay

## 2020-08-20 ENCOUNTER — Ambulatory Visit
Admission: RE | Admit: 2020-08-20 | Discharge: 2020-08-20 | Disposition: A | Discharge status: Home / Self Care | Payer: Medicare Other | Source: Ambulatory Visit | Attending: Internal Medicine | Admitting: Internal Medicine

## 2020-08-20 DIAGNOSIS — Z1231 Encounter for screening mammogram for malignant neoplasm of breast: Secondary | ICD-10-CM | POA: Diagnosis present

## 2020-08-24 ENCOUNTER — Telehealth (INDEPENDENT_AMBULATORY_CARE_PROVIDER_SITE_OTHER): Payer: Medicare Other | Admitting: Gastroenterology

## 2020-08-24 DIAGNOSIS — K317 Polyp of stomach and duodenum: Secondary | ICD-10-CM

## 2020-08-24 NOTE — Progress Notes (Signed)
Jonathon Bellows , MD [address]  [place]  [place], [place] [address]  Main: [phone]  Fax: [phone]   Primary Care Physician: [name], MD  Virtual Visit via Telephone Note  I connected with patient on 08/24/20 at  3:30 PM EST by telephone and verified that I am speaking with the correct person using two identifiers.   I discussed the limitations, risks, security and privacy concerns of performing an evaluation and management service by telephone and the availability of in person appointments. I also discussed with the patient that there may be a patient responsible charge related to this service. The patient expressed understanding and agreed to proceed.  Location of Patient: Home Location of Provider: Home Persons involved: Patient and provider only   History of Present Illness:  Follow-up for gastric polyps  HPI: Deborah Obrien is a 79 y.o. female   Summary of history :  Deborah Obrien was admittedin mid 2019with a severe upper GI bleed. I performed an upper endoscopy and noted a large gastric polyp that was bleeding. At that point of time I sprayed hemosprayand obtained hemostasis. Shewason Eliquis for atrial fibrillation and she has hada history of strokes.  She underwent an USG RUQ which showed a mildly dilated CBD 9 mm, she underwent an EUS at [facility] after and found no stones in her CBD and was discharged. 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 Ct abdomen 06/14/18 showed no acute findings - atrophic pancreatitis. Features of prior kyphoplasty at t12- L1  EGD 07/09/18 : EGD: 4 large gastric polyps were resected. All were hyperplastic , few other smaller polyps seen but not resected.  05/17/2019: EGD: Notable small sessile polyps resected. A 15 mm sessile polyp was also resected by EMR. All polyps were gastric hyperplastic polyps with surface elevation and inflammation were negative for  malignancy.  06/18/2019: Hemoglobin 12.1. 10/21/2019 hemoglobin 11.9 g with an MCV of 97. 10/21/2019: BUN of 36 and creatinine of 1.7 12/29/2019: Hemoglobin 11.7 g.   Interval history6/11/2019-08/24/2020  No recent CBC or iron studies.  Recently been seen by pulmonary for interstitial lung disease with pulmonary fibrosis. Doing well.  Taking Pepcid daily.  Denies any black stools.  Was recently in A. fib and underwent cardioversion.  No other GI symptoms.    Current Outpatient Medications  Medication Sig Dispense Refill  . allopurinol (ZYLOPRIM) 100 MG tablet Take 150 mg by mouth daily.     Marland Kitchen amiodarone (PACERONE) 200 MG tablet Take 1 tablet (200 mg total) by mouth daily. 90 tablet 3  . apixaban (ELIQUIS) 5 MG TABS tablet Take 5 mg by mouth 2 (two) times daily.    . busPIRone (BUSPAR) 10 MG tablet Take 10 mg by mouth 2 (two) times daily.     . cholecalciferol (QC VITAMIN D3) 10 MCG (400 UNIT) TABS tablet Take 400 Units by mouth in the morning and at bedtime.    . colchicine 0.6 MG tablet Take 0.6 mg by mouth daily as needed (gout).     Marland Kitchen docusate sodium (COLACE) 100 MG capsule Take 200 mg by mouth 2 (two) times daily.    . DULoxetine (CYMBALTA) 30 MG capsule Take 30 mg by mouth daily.     . furosemide (LASIX) 20 MG tablet Take 20 mg by mouth every other day. In the morning    . gabapentin (NEURONTIN) 600 MG tablet Take 1,200 mg by mouth 3 (three)  times daily.     Marland Kitchen HYDROcodone-acetaminophen (NORCO) 10-325 MG tablet Take 1 tablet by mouth every 4 (four) hours as needed for moderate pain. (Patient taking differently: Take 1 tablet by mouth every 6 (six) hours as needed for moderate pain. ) 50 tablet 0  . insulin NPH-regular Human (NOVOLIN 70/30) (70-30) 100 UNIT/ML injection Inject 25 Units into the skin 2 (two) times daily before a meal. Before breakfast & before supper    . ketoconazole (NIZORAL) 2 % cream Apply 1 application topically daily as needed (rash/skin irritation.).     Marland Kitchen  levothyroxine (SYNTHROID) 50 MCG tablet Take 50 mcg by mouth daily before breakfast.    . oxybutynin (DITROPAN) 5 MG tablet Take 10 mg by mouth 2 (two) times daily. Breakfast & bedtime    . pantoprazole (PROTONIX) 40 MG tablet Take 1 tablet by mouth once daily (Patient taking differently: Take 40 mg by mouth daily with breakfast. ) 90 tablet 1  . Polyethyl Glycol-Propyl Glycol (SYSTANE) 0.4-0.3 % SOLN Place 1-2 drops into both eyes 4 (four) times daily as needed (dry eyes).     . pravastatin (PRAVACHOL) 20 MG tablet Take 20 mg by mouth at bedtime.   2  . talc (ZEASORB) powder Apply 1 application topically as needed (under the breast irritation).    . traZODone (DESYREL) 50 MG tablet Take 50 mg by mouth at bedtime.     . vitamin B-12 (CYANOCOBALAMIN) 1000 MCG tablet Take 1,000 mcg by mouth daily.    . vitamin C (ASCORBIC ACID) 500 MG tablet Take 500 mg by mouth 2 (two) times daily.     No current facility-administered medications for this visit.    Allergies as of 08/24/2020 - Review Complete 07/15/2020  Allergen Reaction Noted  . Fish allergy Shortness Of Breath 11/26/2015  . Macrolides and ketolides Other (See Comments) 01/12/2015  . Meperidine Shortness Of Breath, Nausea Only, and Other (See Comments) 01/19/2015  . Other Nausea Only, Swelling, Rash, Anaphylaxis, Diarrhea, Shortness Of Breath, and Other (See Comments) 01/12/2015  . Prednisone Anaphylaxis and Other (See Comments) 01/19/2015  . Shellfish allergy Anaphylaxis, Shortness Of Breath, Diarrhea, Nausea Only, and Rash 06/29/2015  . Sulfa antibiotics Shortness Of Breath, Diarrhea, Nausea Only, and Other (See Comments) 06/25/2013  . Sulfacetamide sodium Diarrhea, Nausea Only, Other (See Comments), and Shortness Of Breath 08/12/2015  . Telbivudine Other (See Comments) 08/12/2015  . Uloric [febuxostat] Anaphylaxis 08/22/2016  . Aspirin Rash and Other (See Comments) 04/07/2014  . Celecoxib Other (See Comments) 01/19/2015  . Cephalexin  Diarrhea, Nausea Only, and Other (See Comments) 01/19/2015  . Codeine Diarrhea, Nausea Only, and Other (See Comments) 01/19/2015  . Dilaudid [hydromorphone hcl] Other (See Comments) 07/03/2018  . Erythromycin Diarrhea, Nausea Only, and Other (See Comments) 01/19/2015  . Iodinated diagnostic agents Rash and Other (See Comments) 01/19/2015  . Lipitor [atorvastatin calcium] Nausea Only 07/03/2018  . Oxycodone Other (See Comments) 01/19/2015  . Aleve [naproxen]  07/03/2018  . Atorvastatin Nausea Only and Other (See Comments) 01/19/2015  . Cephalosporins Other (See Comments) 01/19/2015  . Doxycycline  08/24/2017  . Iodine Rash 06/29/2015  . Motrin [ibuprofen]  07/03/2018  . Tape Other (See Comments) 06/29/2015  . Valdecoxib Swelling and Other (See Comments) 01/19/2015    Review of Systems:    All systems reviewed and negative except where noted in HPI.   Observations/Objective:  Labs: CMP     Component Value Date/Time   NA 142 07/15/2020 0921   NA 141 10/16/2014 1135  K 4.3 07/15/2020 0921   K 4.6 10/16/2014 1135   CL 102 07/15/2020 0921   CL 108 (H) 10/16/2014 1135   CO2 23 12/29/2019 0603   CO2 27 10/16/2014 1135   GLUCOSE 94 07/15/2020 0921   GLUCOSE 97 10/16/2014 1135   BUN 27 (H) 07/15/2020 0921   BUN 25 (H) 10/16/2014 1135   CREATININE 1.90 (H) 07/15/2020 0921   CREATININE 1.35 (H) 10/16/2014 1135   CALCIUM 8.6 (L) 12/29/2019 0603   CALCIUM 8.5 10/16/2014 1135   PROT 6.9 12/29/2019 0603   PROT 8.0 10/29/2012 1100   ALBUMIN 3.3 (L) 12/29/2019 0603   ALBUMIN 3.9 10/29/2012 1100   AST 33 12/29/2019 0603   AST 47 (H) 10/29/2012 1100   ALT 18 12/29/2019 0603   ALT 27 10/29/2012 1100   ALKPHOS 158 (H) 12/29/2019 0603   ALKPHOS 156 (H) 10/29/2012 1100   BILITOT 1.4 (H) 12/29/2019 0603   BILITOT 1.6 (H) 10/29/2012 1100   GFRNONAA 29 (L) 12/29/2019 0603   GFRNONAA 41 (L) 10/16/2014 1135   GFRNONAA 35 (L) 10/29/2012 1100   GFRAA 34 (L) 12/29/2019 0603   GFRAA 49 (L)  10/16/2014 1135   GFRAA 41 (L) 10/29/2012 1100   Lab Results  Component Value Date   WBC 5.8 03/23/2020   HGB 12.2 07/15/2020   HCT 36.0 07/15/2020   MCV 88.9 03/23/2020   PLT 224 03/23/2020    Imaging Studies: MM 3D SCREEN BREAST BILATERAL  Result Date: 08/23/2020 CLINICAL DATA:  Screening. EXAM: DIGITAL SCREENING BILATERAL MAMMOGRAM WITH TOMO AND CAD COMPARISON:  Previous exam(s). ACR Breast Density Category b: There are scattered areas of fibroglandular density. FINDINGS: There are no findings suspicious for malignancy. Images were processed with CAD. IMPRESSION: No mammographic evidence of malignancy. A result letter of this screening mammogram will be mailed directly to the patient. RECOMMENDATION: Screening mammogram in one year. (Code:SM-B-01Y) BI-RADS CATEGORY  1: Negative. Electronically Signed   By: Dorise Bullion III M.D   On: 08/23/2020 14:38    Assessment and Plan:   ASHIRA Obrien is a 78 y.o. y/o female here tofollow-up for gastric polyps which caused severe GI bleed in the past requiring resection. On Eliquis s/p EGD and resection of multiple gastric polyps. Every time we perform an endoscopy she has severe back pain and chest wall pain. We discussed that moving forward we need to balance the risks of the procedure and the subsequent discomfort she faces versus the benefits of the procedure.She has severe pain when she lays down due to her back.  With her diagnosis of interstitial lung disease fractured ribs, recent A. fib I would suggest that we monitor CBC and as long as it is stable plan to closely monitor it and avoid any endoscopy procedures.  Plan 1. Recheck CBC if stable can follow-up with [name], MD and can always see [place] back if there is any acute problem     I discussed the assessment and treatment plan with the patient. The patient was provided an opportunity to ask questions and all were answered. The patient agreed with the plan and  demonstrated an understanding of the instructions.   The patient was advised to call back or seek an in-person evaluation if the symptoms worsen or if the condition fails to improve as anticipated.  I provided 11 minutes of non-face-to-face time during this encounter.  Dr Jonathon Bellows MD,MRCP [facility]) Gastroenterology/Hepatology Pager: [phone]   Speech recognition software was used to dictate this  note.

## 2020-08-25 ENCOUNTER — Ambulatory Visit (INDEPENDENT_AMBULATORY_CARE_PROVIDER_SITE_OTHER): Payer: Medicare Other

## 2020-08-25 DIAGNOSIS — I480 Paroxysmal atrial fibrillation: Secondary | ICD-10-CM

## 2020-08-25 LAB — CUP PACEART REMOTE DEVICE CHECK
Battery Remaining Longevity: 119 mo
Battery Voltage: 2.94 V
Brady Statistic AP VP Percent: 0.05 %
Brady Statistic AP VS Percent: 95.68 %
Brady Statistic AS VP Percent: 0 %
Brady Statistic AS VS Percent: 4.28 %
Brady Statistic RA Percent Paced: 95.44 %
Brady Statistic RV Percent Paced: 0.06 %
Date Time Interrogation Session: 20211214082818
Implantable Lead Implant Date: 20161019
Implantable Lead Implant Date: 20181217
Implantable Lead Location: 753859
Implantable Lead Location: 753860
Implantable Lead Model: 5076
Implantable Lead Model: 5076
Implantable Pulse Generator Implant Date: 20181217
Lead Channel Impedance Value: 323 Ohm
Lead Channel Impedance Value: 361 Ohm
Lead Channel Impedance Value: 399 Ohm
Lead Channel Impedance Value: 418 Ohm
Lead Channel Pacing Threshold Amplitude: 0.625 V
Lead Channel Pacing Threshold Amplitude: 0.875 V
Lead Channel Pacing Threshold Pulse Width: 0.4 ms
Lead Channel Pacing Threshold Pulse Width: 0.4 ms
Lead Channel Sensing Intrinsic Amplitude: 5 mV
Lead Channel Sensing Intrinsic Amplitude: 5 mV
Lead Channel Sensing Intrinsic Amplitude: 5 mV
Lead Channel Sensing Intrinsic Amplitude: 5 mV
Lead Channel Setting Pacing Amplitude: 2 V
Lead Channel Setting Pacing Amplitude: 2.5 V
Lead Channel Setting Pacing Pulse Width: 0.4 ms
Lead Channel Setting Sensing Sensitivity: 1.2 mV

## 2020-08-27 ENCOUNTER — Other Ambulatory Visit: Payer: Self-pay | Admitting: *Deleted

## 2020-08-27 MED ORDER — APIXABAN 5 MG PO TABS
5.0000 mg | ORAL_TABLET | Freq: Two times a day (BID) | ORAL | 3 refills | Status: DC
Start: 2020-08-27 — End: 2020-11-03

## 2020-09-02 ENCOUNTER — Other Ambulatory Visit: Payer: Medicare Other

## 2020-09-02 ENCOUNTER — Telehealth: Payer: Self-pay | Admitting: Internal Medicine

## 2020-09-02 DIAGNOSIS — Z20822 Contact with and (suspected) exposure to covid-19: Secondary | ICD-10-CM

## 2020-09-02 NOTE — Telephone Encounter (Signed)
Completed patient assistance renewal application for [id number] faxed to [employer] for the patient's eliquis.  Faxed to [phone] Confirmation received.   I have called and spoken with the patient's husband (ok per DPR) to make him aware this has been taken care of. He voices understanding.  The completed application has been placed in the file cabinet for assistance applications in the drug closet.

## 2020-09-03 LAB — SARS-COV-2, NAA 2 DAY TAT

## 2020-09-03 LAB — NOVEL CORONAVIRUS, NAA: SARS-CoV-2, NAA: NOT DETECTED

## 2020-09-09 NOTE — Progress Notes (Signed)
Remote pacemaker transmission.   

## 2020-10-06 ENCOUNTER — Encounter: Payer: Self-pay | Admitting: Internal Medicine

## 2020-10-06 ENCOUNTER — Other Ambulatory Visit
Admission: RE | Admit: 2020-10-06 | Discharge: 2020-10-06 | Disposition: A | Discharge status: Home / Self Care | Payer: Medicare Other | Attending: Internal Medicine | Admitting: Internal Medicine

## 2020-10-06 ENCOUNTER — Other Ambulatory Visit: Payer: Self-pay

## 2020-10-06 ENCOUNTER — Ambulatory Visit: Payer: Medicare Other | Admitting: Internal Medicine

## 2020-10-06 VITALS — BP 120/60 | HR 65 | Ht 64.0 in | Wt 199.1 lb

## 2020-10-06 DIAGNOSIS — Z950 Presence of cardiac pacemaker: Secondary | ICD-10-CM

## 2020-10-06 DIAGNOSIS — Z5181 Encounter for therapeutic drug level monitoring: Secondary | ICD-10-CM | POA: Insufficient documentation

## 2020-10-06 DIAGNOSIS — I5022 Chronic systolic (congestive) heart failure: Secondary | ICD-10-CM | POA: Diagnosis not present

## 2020-10-06 DIAGNOSIS — I5032 Chronic diastolic (congestive) heart failure: Secondary | ICD-10-CM

## 2020-10-06 DIAGNOSIS — Z79899 Other long term (current) drug therapy: Secondary | ICD-10-CM | POA: Insufficient documentation

## 2020-10-06 DIAGNOSIS — I480 Paroxysmal atrial fibrillation: Secondary | ICD-10-CM | POA: Diagnosis not present

## 2020-10-06 DIAGNOSIS — I495 Sick sinus syndrome: Secondary | ICD-10-CM | POA: Diagnosis not present

## 2020-10-06 LAB — TSH: TSH: 3.091 u[IU]/mL (ref 0.350–4.500)

## 2020-10-06 NOTE — Patient Instructions (Signed)
Medication Instructions:  - Your physician recommends that you continue on your current medications as directed. Please refer to the Current Medication list given to you today.  *If you need a refill on your cardiac medications before your next appointment, please call your pharmacy*   Lab Work: - Your physician recommends that you have lab work today: TSH  If you have labs (blood work) drawn today and your tests are completely normal, you will receive your results only by: Marland Kitchen MyChart Message (if you have MyChart) OR . A paper copy in the mail If you have any lab test that is abnormal or we need to change your treatment, we will call you to review the results.   Testing/Procedures: - none ordered   Follow-Up: At [facility], you and your health needs are our priority.  As part of our continuing mission to provide you with exceptional heart care, we have created designated Provider Care Teams.  These Care Teams include your primary Cardiologist (physician) and Advanced Practice Providers (APPs -  Physician Assistants and Nurse Practitioners) who all work together to provide you with the care you need, when you need it.  We recommend signing up for the patient portal called "MyChart".  Sign up information is provided on this After Visit Summary.  MyChart is used to connect with patients for Virtual Visits (Telemedicine).  Patients are able to view lab/test results, encounter notes, upcoming appointments, etc.  Non-urgent messages can be sent to your provider as well.   To learn more about what you can do with MyChart, go to [email or web address].    Your next appointment:   6 month(s)  The format for your next appointment:   In Person  Provider:   [name], MD   Other Instructions n/a

## 2020-10-06 NOTE — Progress Notes (Signed)
Patient Care Team: [name], MD as PCP - General (Internal Medicine) Deboraha Sprang, MD as PCP - Cardiology (Cardiology) [name], MD (Internal Medicine) Bary Castilla Forest Gleason, MD (General Surgery)   HPI  Deborah Obrien is a 79 y.o. female Seen for sinus node dysfunction exercise intolerance and previously implanted single chamber pacemaker for which she underwent dual chamber upgrade 12/18 cx by encircling of the chronic RV lead by the atrial lead resulting in tension for which she underwent RA lead revision   She has hx of atrial arrhythmias, with atrial fib and flutter, typical/atypical all documented.  on amiodarone    Patient denies symptoms of GI intolerance, sun sensitivity, neurological symptoms attributable to amiodarone.  She has had a cough.  She had a high resolution CT scan about 6 months ago which showed mild changes with repeat scanning recommended.  She is scheduled to see pulmonary in the next couple of weeks for repeat testing.  Her major limitations are both a combination of dyspnea as well as severe back pain.  Her walker is too short and she bends over about 45 degrees to use it.  No interval palpitations of which she is aware.    Date Test EF   11/16  Echo   55-60   12/17 Echo   45% Stress neg  12/17 Cath  Nonobstructive CAD  Mr 2+  2/19 Myoview  55-65% No ischemia  7/19 Echo    60-65% Mild RVE     Date Cr K Hgb TSH LFTs  10/18 1.72  13.5    12/18 1.32  13.5    6/19 1.57 5.5>>4.0 13.7 2.95 71(5/19)  8/19 1.53 3.8 11.8>>12.7  14  10/19   12.2 (11/19)  20  1/20 1.94 4.3 11.5 10.67 21  5/20 1.6 4.8  4.135 50  1/21 1.6<<2.1 4.7 12.1 (10/20) 7.133 33  4/21 1.66 4.9 11.7 4.1 (CE) 5/21 18  11/21 1.9 4.3 12.2  28 (99991111)    Thromboembolic risk factors ( age  -2, HTN-1, TIA/CVA-2 , DM-1, Vasc disease -1, Gender-1) for a CHADSVASc Score of >=8   Past Medical History:  Diagnosis Date  . A-fib ([place]) 2016  . Acid reflux   .  Anemia   . Anxiety   . Bowel obstruction ([place])   . Carotid artery disease ([place])   . CHF (congestive heart failure) ([place]) 2017  . Chronic kidney disease (CKD) stage G3a/A1, moderately decreased glomerular filtration rate (GFR) between 45-59 mL/min/1.73 square meter and albuminuria creatinine ratio less than 30 mg/g (HCC)    stage IV  . Coronary artery disease   . Depression   . Diabetes mellitus without complication ([place])   . Dyspnea   . Dysrhythmia    PAROXYSMAL ATRIAL FIBRILLATION  . Fatty liver   . GI bleed   . Gout   . Hypertension   . Hypothyroidism   . Low back pain    history of kyphoplasty t12-l1  . Morbid obesity ([place])   . Multiple gastric polyps    history of gi bleed.  polyps removed  . Neuromuscular disorder ([place])    diabetic neuropathy  . Osteoarthritis   . Osteoporosis   . Paroxysmal atrial fibrillation (HCC)   . Presence of permanent cardiac pacemaker   . Sinus node dysfunction ([place]) 08/24/2017  . Stroke [facility])    TIA, patient unaware that she had it. no residual  . TIA (transient ischemic attack)  Past Surgical History:  Procedure Laterality Date  . ABDOMINAL HYSTERECTOMY  1980  . APPENDECTOMY    . BACK SURGERY     2008. plate & screws   . BACK SURGERY  2019   patient describes kyphoplasty for compression fractures, MD referral said fusion  . BREAST BIOPSY Right 08/16   stereo fibroadenomatous change, neg for atypia  . BREAST EXCISIONAL BIOPSY Left    neg  . CARDIAC CATHETERIZATION Left 08/25/2016   Procedure: Left Heart Cath and Coronary Angiography;  Surgeon: Corey Skains, MD;  Location: [place] [facility];  Service: Cardiovascular;  Laterality: Left;  . CARDIOVERSION N/A 06/28/2017   Procedure: CARDIOVERSION;  Surgeon: Corey Skains, MD;  Location: [facility] ORS;  Service: Cardiovascular;  Laterality: N/A;  . CARDIOVERSION N/A 02/06/2018   Procedure: CARDIOVERSION;  Surgeon: Josue Hector, MD;  Location: [facility] ENDOSCOPY;  Service:  Cardiovascular;  Laterality: N/A;  . CARDIOVERSION N/A 07/15/2020   Procedure: CARDIOVERSION;  Surgeon: Jerline Pain, MD;  Location: [place];  Service: Cardiovascular;  Laterality: N/A;  . CATARACT EXTRACTION, BILATERAL  2012  . CHOLECYSTECTOMY    . colon blockage  1999  . COLON SURGERY  2000   removed 20% of colon. colon had collapsed  . COLONOSCOPY  2016   polyps removed 2016  . DORSAL COMPARTMENT RELEASE Left 09/14/2016   Procedure: RELEASE DORSAL COMPARTMENT (DEQUERVAIN);  Surgeon: Dereck Leep, MD;  Location: [facility] ORS;  Service: Orthopedics;  Laterality: Left;  . ESOPHAGOGASTRODUODENOSCOPY N/A 01/27/2015   Procedure: ESOPHAGOGASTRODUODENOSCOPY (EGD);  Surgeon: Lollie Sails, MD;  Location: [facility] ENDOSCOPY;  Service: Endoscopy;  Laterality: N/A;  . ESOPHAGOGASTRODUODENOSCOPY (EGD) WITH PROPOFOL N/A 07/24/2015   Procedure: ESOPHAGOGASTRODUODENOSCOPY (EGD) WITH PROPOFOL;  Surgeon: Lucilla Lame, MD;  Location: [facility] ENDOSCOPY;  Service: Endoscopy;  Laterality: N/A;  . ESOPHAGOGASTRODUODENOSCOPY (EGD) WITH PROPOFOL N/A 04/12/2018   Procedure: ESOPHAGOGASTRODUODENOSCOPY (EGD) WITH PROPOFOL;  Surgeon: Jonathon Bellows, MD;  Location: [facility] ENDOSCOPY;  Service: Gastroenterology;  Laterality: N/A;  . ESOPHAGOGASTRODUODENOSCOPY (EGD) WITH PROPOFOL N/A 06/22/2018   Procedure: ESOPHAGOGASTRODUODENOSCOPY (EGD) WITH PROPOFOL;  Surgeon: [name], MD;  Location: [facility] ENDOSCOPY;  Service: Endoscopy;  Laterality: N/A;  . ESOPHAGOGASTRODUODENOSCOPY (EGD) WITH PROPOFOL N/A 07/09/2018   Procedure: ESOPHAGOGASTRODUODENOSCOPY (EGD) WITH PROPOFOL with resection of gastric polyps;  Surgeon: Jonathon Bellows, MD;  Location: [facility] ENDOSCOPY;  Service: Gastroenterology;  Laterality: N/A;  . ESOPHAGOGASTRODUODENOSCOPY (EGD) WITH PROPOFOL N/A 05/17/2019   Procedure: ESOPHAGOGASTRODUODENOSCOPY (EGD) WITH PROPOFOL;  Surgeon: Jonathon Bellows, MD;  Location: [facility] ENDOSCOPY;  Service: Gastroenterology;  Laterality: N/A;  . EUS  N/A 06/22/2018   Procedure: UPPER ENDOSCOPIC ULTRASOUND (EUS) RADIAL;  Surgeon: [name], MD;  Location: [facility] ENDOSCOPY;  Service: Endoscopy;  Laterality: N/A;  . EYE SURGERY Bilateral    cataract extractions  . JOINT REPLACEMENT Bilateral 2014   Bilateral Knee replacement  . LEAD REVISION/REPAIR N/A 08/29/2017   Procedure: LEAD REVISION/REPAIR;  Surgeon: Constance Haw, MD;  Location: [place] [facility];  Service: Cardiovascular;  Laterality: N/A;  . OOPHORECTOMY    . PACEMAKER INSERTION Left 07/01/2015   Procedure: INSERTION PACEMAKER;  Surgeon: Isaias Cowman, MD;  Location: [facility] ORS;  Service: Cardiovascular;  Laterality: Left;  . PACEMAKER REVISION N/A 08/28/2017   Procedure: PACEMAKER REVISION;  Surgeon: Deboraha Sprang, MD;  Location: [place] [facility];  Service: Cardiovascular;  Laterality: N/A;  . REPLACEMENT TOTAL KNEE BILATERAL    . SHOULDER ARTHROSCOPY WITH ROTATOR CUFF REPAIR AND OPEN BICEPS TENODESIS Left 11/21/2019  Procedure: LEFT SHOULDER ARTHROSCOPY WITH DEBRIDEMENT, DECOMPRESSION, ROTATOR CUFF REPAIR AND BICEPS TENOLYSIS;  Surgeon: Corky Mull, MD;  Location: [facility] ORS;  Service: Orthopedics;  Laterality: Left;  . SHOULDER ARTHROSCOPY WITH SUBACROMIAL DECOMPRESSION Left 2013  . TEE WITHOUT CARDIOVERSION N/A 06/28/2017   Procedure: TRANSESOPHAGEAL ECHOCARDIOGRAM (TEE);  Surgeon: Corey Skains, MD;  Location: [facility] ORS;  Service: Cardiovascular;  Laterality: N/A;    Current Outpatient Medications  Medication Sig Dispense Refill  . allopurinol (ZYLOPRIM) 100 MG tablet Take 150 mg by mouth daily.     Marland Kitchen amiodarone (PACERONE) 200 MG tablet Take 1 tablet (200 mg total) by mouth daily. 90 tablet 3  . apixaban (ELIQUIS) 5 MG TABS tablet Take 1 tablet (5 mg total) by mouth 2 (two) times daily. 180 tablet 3  . busPIRone (BUSPAR) 10 MG tablet Take 10 mg by mouth 2 (two) times daily.     . cholecalciferol (VITAMIN D3) 10 MCG (400 UNIT) TABS tablet Take 400 Units  by mouth in the morning and at bedtime.    . colchicine 0.6 MG tablet Take 0.6 mg by mouth daily as needed (gout).     Marland Kitchen docusate sodium (COLACE) 100 MG capsule Take 200 mg by mouth 2 (two) times daily.    . DULoxetine (CYMBALTA) 30 MG capsule Take 30 mg by mouth daily.     . furosemide (LASIX) 20 MG tablet Take 20 mg by mouth every other day. In the morning    . gabapentin (NEURONTIN) 600 MG tablet Take 1,200 mg by mouth 3 (three) times daily.     Marland Kitchen HYDROcodone-acetaminophen (NORCO) 10-325 MG tablet Take 1 tablet by mouth every 4 (four) hours as needed for moderate pain. (Patient taking differently: Take 1 tablet by mouth every 6 (six) hours as needed for moderate pain.) 50 tablet 0  . insulin NPH-regular Human (NOVOLIN 70/30) (70-30) 100 UNIT/ML injection Inject 25 Units into the skin 2 (two) times daily before a meal. Before breakfast & before supper    . ketoconazole (NIZORAL) 2 % cream Apply 1 application topically daily as needed (rash/skin irritation.).     Marland Kitchen levothyroxine (SYNTHROID) 50 MCG tablet Take 50 mcg by mouth daily before breakfast.    . oxybutynin (DITROPAN) 5 MG tablet Take 10 mg by mouth 2 (two) times daily. Breakfast & bedtime    . pantoprazole (PROTONIX) 40 MG tablet Take 1 tablet by mouth once daily (Patient taking differently: Take 40 mg by mouth daily with breakfast.) 90 tablet 1  . Polyethyl Glycol-Propyl Glycol 0.4-0.3 % SOLN Place 1-2 drops into both eyes 4 (four) times daily as needed (dry eyes).     . pravastatin (PRAVACHOL) 20 MG tablet Take 20 mg by mouth at bedtime.   2  . talc powder Apply 1 application topically as needed (under the breast irritation).    . traZODone (DESYREL) 50 MG tablet Take 50 mg by mouth at bedtime.     . vitamin B-12 (CYANOCOBALAMIN) 1000 MCG tablet Take 1,000 mcg by mouth daily.    . vitamin C (ASCORBIC ACID) 500 MG tablet Take 500 mg by mouth 2 (two) times daily.     No current facility-administered medications for this visit.     Allergies  Allergen Reactions  . Fish Allergy Shortness Of Breath    All kinds of fish. Severe vomitting even with smell of fish.   . Macrolides And Ketolides Other (See Comments)    Severe stomach pain (mycins)  . Meperidine Shortness Of Breath,  Nausea Only and Other (See Comments)    Stomach pain   . Other Nausea Only, Swelling, Rash, Anaphylaxis, Diarrhea, Shortness Of Breath and Other (See Comments)    Allergen: NON-STEROIDS, bextra - hands and feet swell  Reaction:  Stomach pain   Other Reaction: Intolerance  . Prednisone Anaphylaxis and Other (See Comments)    (facial swelling/redness/burning)Increases pts blood sugar  Pt states that she is allergic to all steroids.    . Shellfish Allergy Anaphylaxis, Shortness Of Breath, Diarrhea, Nausea Only and Rash    Stomach pain   . Sulfa Antibiotics Shortness Of Breath, Diarrhea, Nausea Only and Other (See Comments)    Reaction:  Stomach pain   Reaction: Stomach pain  Other Reaction: Intolerance  . Sulfacetamide Sodium Diarrhea, Nausea Only, Other (See Comments) and Shortness Of Breath    Reaction:  Stomach pain   . Telbivudine Other (See Comments)    Unknown reaction  . Uloric [Febuxostat] Anaphylaxis    Locks pt's body up   . Aspirin Rash and Other (See Comments)    Stomach pain (tolerates lower doses)  . Celecoxib Other (See Comments)    GI bleed, weakness, and stomach pain.    . Cephalexin Diarrhea, Nausea Only and Other (See Comments)    stomach pain   . Codeine Diarrhea, Nausea Only and Other (See Comments)    Reaction:  Stomach pain Pt tolerates morphine   . Dilaudid [Hydromorphone Hcl] Other (See Comments)    Reactions: easy to overdose - blood pressure drops really low  . Erythromycin Diarrhea, Nausea Only and Other (See Comments)    Reaction:  Stomach pain   . Iodinated Diagnostic Agents Rash and Other (See Comments)    Pt states that she is unable to have because she has chronic kidney disease.   CKD Pt  states that she is unable to have because she has chronic kidney disease.  CKD   . Lipitor [Atorvastatin Calcium] Nausea Only    Reaction: nausea, weakness, pass blood  . Oxycodone Other (See Comments)    Reaction:  Stomach pain   . Aleve [Naproxen]     Reaction: severe stomach pain  . Atorvastatin Nausea Only and Other (See Comments)    Weakness   . Cephalosporins Other (See Comments)    Reaction:  Unknown   . Doxycycline     Stomach pain   . Iodine Rash  . Motrin [Ibuprofen]     Reaction: severe stomach pain  . Tape Other (See Comments)    Causes pts skin to tear  Pt states that she is able to use paper tape.      . Valdecoxib Swelling and Other (See Comments)    Pt states that her hands and feet swell.        Review of Systems negative except from HPI and PMH  Physical Exam   BP 120/60 (BP Location: Left Arm, Patient Position: Sitting, Cuff Size: Normal)   Pulse 65   Ht '5\' 4"'$  (1.626 m)   Wt 199 lb 2 oz (90.3 kg)   SpO2 99%   BMI 34.18 kg/m   Well developed and well nourished in no acute distress sitting in a wheel chair HENT normal Neck supple  Dry crackles  Device pocket well healed; without hematoma or erythema.  There is no tethering  Regular rate and rhythm, no  murmur Abd-soft with active BS No Clubbing cyanosis edema Skin-warm and dry A & Oriented  Grossly normal sensory and motor function  ECG atrial paced at 65 Intervals 28/08/42  Device interrogation demonstrated atrial flutter that was not paced terminated by the ATP protocol.  This was reprogrammed to make more aggressive.  (History of pace termination in the office)  Assessment and  Plan  Sinus node dysfunction  Paroxysmal atrial fibrillation/flutter with a rapid ventricular response  High Risk Medication Surveillance--Amiodarone  Cardiomyopathy-interval normalization   Diabetes mellitus with nephropathy  HFpEF  RV enlargement  Thromboembolic risk profile as noted above    Medtronic pacemaker upgraded to dual chamber    Dyspnea Cough question amiodarone lung injury  Infrequent recurrences of atrial arrhythmia.  Low threshold for discontinuing the amiodarone.  We will plan to speak to her pulmonologist following her next visit.  Continue anticoagulation.  Have also reprogrammed the device to try to more successfully terminate her atrial flutter.  Needs amiodarone surveillance laboratories specifically her TSH.  No evidence of volume overload.  Her dyspnea is multifactorial undoubtedly.  In part related to poor conditioning is suggested she get a chair elliptical.  Hopefully her back will tolerate it.

## 2020-10-23 ENCOUNTER — Telehealth: Payer: Self-pay | Admitting: Internal Medicine

## 2020-10-23 NOTE — Telephone Encounter (Signed)
Patient husband calling to check status of Eliquis medication assistance .

## 2020-10-23 NOTE — Telephone Encounter (Signed)
I spoke with the patient's husband this afternoon. He states he called [employer] to follow up on the application I faxed in on 09/02/20 for the patient's eliquis.  He advised he was told by [employer] they never received this.  I advised Mr. Paschall I had a fax confirmation from 09/02/20 where I had sent this in, but I will refax all the paperwork again.  Application faxed to ([id number]) [phone]. Confirmation received.

## 2020-10-28 ENCOUNTER — Ambulatory Visit: Payer: Medicare Other | Admitting: Dermatology

## 2020-10-30 ENCOUNTER — Other Ambulatory Visit
Admission: RE | Admit: 2020-10-30 | Discharge: 2020-10-30 | Disposition: A | Discharge status: Home / Self Care | Payer: Medicare Other | Source: Ambulatory Visit | Attending: Internal Medicine | Admitting: Internal Medicine

## 2020-10-30 DIAGNOSIS — U071 COVID-19: Secondary | ICD-10-CM | POA: Diagnosis present

## 2020-10-30 LAB — D-DIMER, QUANTITATIVE: D-Dimer, Quant: 0.39 ug/mL-FEU (ref 0.00–0.50)

## 2020-11-02 ENCOUNTER — Telehealth: Payer: Self-pay | Admitting: Internal Medicine

## 2020-11-02 DIAGNOSIS — I4891 Unspecified atrial fibrillation: Secondary | ICD-10-CM

## 2020-11-02 DIAGNOSIS — I4892 Unspecified atrial flutter: Secondary | ICD-10-CM

## 2020-11-02 NOTE — Telephone Encounter (Signed)
We can restart patient back on warfarin and schedule her for an INR check next Wednesday.  IS she completely out of Eliquis or is she still taking it?

## 2020-11-02 NOTE — Telephone Encounter (Signed)
LlSA  [id number]  Thanks SK

## 2020-11-02 NOTE — Telephone Encounter (Signed)
Spoke with the patients husband. They have received a denial letter from [facility] for Eliquis patient assistance. The patient will need to meet the 3% medication deductible.  Patient and her husband would like for coumadin to be resumed. Adv the pt husband that I will fwd the update to [name].

## 2020-11-02 NOTE — Telephone Encounter (Signed)
Called pt husband to inquire exactly how many days of Eliquis they have on hand. Unable to lmom. Phone rings out.

## 2020-11-02 NOTE — Telephone Encounter (Signed)
Patient spouse calling  States they want to start back on warfarin as soon as next week as they are not able to receive Eliquis Please call to discuss

## 2020-11-03 DIAGNOSIS — I4892 Unspecified atrial flutter: Secondary | ICD-10-CM | POA: Insufficient documentation

## 2020-11-03 MED ORDER — WARFARIN SODIUM 5 MG PO TABS: 5.0000 mg | ORAL_TABLET | Freq: Every day | ORAL | 3 refills | Status: DC

## 2020-11-03 NOTE — Telephone Encounter (Signed)
Spoke with Pharmacy and OK'd taking warfarin and amiodarone together.  INR will be monitored.

## 2020-11-03 NOTE — Telephone Encounter (Addendum)
Spoke with the patient and her husband. She will take her last dose of Eliquis Friday  evening 11/06/20. Adv them that [facility] will contact them to initiate coumadin and schedule f/u INR monitoring. [facility] referral placed.

## 2020-11-03 NOTE — Telephone Encounter (Signed)
Haley. Pt will need to start warfarin '5mg'$  tomorrow night (Wednesday) and overlap with Eliquis x 3 days. (W,Th,Fri)  Last dose of Eliquis on Friday and continue Warfarin '5mg'$  daily.  Recheck INR in [place] next Wednesday.  I have sent Warfarin Rx to [facility].  I have not made INR appt.   Thanks,Minela [place]

## 2020-11-03 NOTE — Telephone Encounter (Signed)
Pharmacy calling about interaction with amiodarone 200 mg po q d and warfarin   Please call asap.

## 2020-11-03 NOTE — Telephone Encounter (Signed)
We are aware pt is on Amiodarone and Warfarin, and the interaction between the 2 drugs.  Pt will be closely monitored in the [facility].  Pharmacy will fill rx. Pt has scheduled follow-up appt on 11/11/20 at 2:30pm.

## 2020-11-03 NOTE — Telephone Encounter (Signed)
Called pharmacy to advise them to fill warfarin, we are aware there's a drug interaction with amiodarone and her INRs will be monitored. Also discussed with Deborah Obrien who will call pt to provide warfarin/Eliquis overlap instructions as outlined by Lattie Haw, RN, and will also move INR appt as it was made for tomorrow in error instead of next Wednesday.

## 2020-11-03 NOTE — Telephone Encounter (Signed)
Do I need to call and get this pt enrolled? If so is there an overlap period? They run out of eliquis Friday and how much coumadin? Will route to pharmd pool. I accidentally pressed done under the anticoagulation enrollment

## 2020-11-03 NOTE — Telephone Encounter (Signed)
Called pt to reschedule coumadin visit and explain dosing schedule overlap. Pt voiced understanding

## 2020-11-11 ENCOUNTER — Ambulatory Visit (INDEPENDENT_AMBULATORY_CARE_PROVIDER_SITE_OTHER): Payer: Medicare Other

## 2020-11-11 ENCOUNTER — Other Ambulatory Visit: Payer: Self-pay

## 2020-11-11 DIAGNOSIS — Z5181 Encounter for therapeutic drug level monitoring: Secondary | ICD-10-CM

## 2020-11-11 DIAGNOSIS — I4891 Unspecified atrial fibrillation: Secondary | ICD-10-CM

## 2020-11-11 DIAGNOSIS — I480 Paroxysmal atrial fibrillation: Secondary | ICD-10-CM

## 2020-11-11 DIAGNOSIS — I4892 Unspecified atrial flutter: Secondary | ICD-10-CM

## 2020-11-11 DIAGNOSIS — Z8673 Personal history of transient ischemic attack (TIA), and cerebral infarction without residual deficits: Secondary | ICD-10-CM | POA: Diagnosis not present

## 2020-11-11 LAB — POCT INR: INR: 2 (ref 2.0–3.0)

## 2020-11-11 NOTE — Patient Instructions (Signed)
-   continue dosage of 1 tablet (5 mg) every day - recheck INR in 2 weeks  Get your greens back on a schedule   A full discussion of the nature of anticoagulants has been carried out.  A benefit risk analysis has been presented to the patient, so that they understand the justification for choosing anticoagulation at this time. The need for frequent and regular monitoring, precise dosage adjustment and compliance is stressed.  Side effects of potential bleeding are discussed.  The patient should avoid any OTC items containing aspirin or ibuprofen, and should avoid great swings in general diet.  Avoid alcohol consumption.  Call if any signs of abnormal bleeding.  Next PT/INR test in 2 weeks; telephone follow up thereafter.

## 2020-11-24 ENCOUNTER — Ambulatory Visit (INDEPENDENT_AMBULATORY_CARE_PROVIDER_SITE_OTHER): Payer: Medicare Other

## 2020-11-24 DIAGNOSIS — I442 Atrioventricular block, complete: Secondary | ICD-10-CM | POA: Diagnosis not present

## 2020-11-24 LAB — CUP PACEART REMOTE DEVICE CHECK
Battery Remaining Longevity: 115 mo
Battery Voltage: 2.94 V
Brady Statistic AP VP Percent: 0.03 %
Brady Statistic AP VS Percent: 93.81 %
Brady Statistic AS VP Percent: 0 %
Brady Statistic AS VS Percent: 6.16 %
Brady Statistic RA Percent Paced: 93.85 %
Brady Statistic RV Percent Paced: 0.04 %
Date Time Interrogation Session: 20220315013947
Implantable Lead Implant Date: 20161019
Implantable Lead Implant Date: 20181217
Implantable Lead Location: 753859
Implantable Lead Location: 753860
Implantable Lead Model: 5076
Implantable Lead Model: 5076
Implantable Pulse Generator Implant Date: 20181217
Lead Channel Impedance Value: 304 Ohm
Lead Channel Impedance Value: 342 Ohm
Lead Channel Impedance Value: 380 Ohm
Lead Channel Impedance Value: 418 Ohm
Lead Channel Pacing Threshold Amplitude: 0.625 V
Lead Channel Pacing Threshold Amplitude: 0.875 V
Lead Channel Pacing Threshold Pulse Width: 0.4 ms
Lead Channel Pacing Threshold Pulse Width: 0.4 ms
Lead Channel Sensing Intrinsic Amplitude: 4.625 mV
Lead Channel Sensing Intrinsic Amplitude: 4.625 mV
Lead Channel Sensing Intrinsic Amplitude: 5.25 mV
Lead Channel Sensing Intrinsic Amplitude: 5.25 mV
Lead Channel Setting Pacing Amplitude: 2 V
Lead Channel Setting Pacing Amplitude: 2.5 V
Lead Channel Setting Pacing Pulse Width: 0.4 ms
Lead Channel Setting Sensing Sensitivity: 1.2 mV

## 2020-11-25 ENCOUNTER — Other Ambulatory Visit: Payer: Self-pay

## 2020-11-25 ENCOUNTER — Ambulatory Visit (INDEPENDENT_AMBULATORY_CARE_PROVIDER_SITE_OTHER): Payer: Medicare Other

## 2020-11-25 DIAGNOSIS — Z5181 Encounter for therapeutic drug level monitoring: Secondary | ICD-10-CM | POA: Diagnosis not present

## 2020-11-25 DIAGNOSIS — I4891 Unspecified atrial fibrillation: Secondary | ICD-10-CM

## 2020-11-25 DIAGNOSIS — I480 Paroxysmal atrial fibrillation: Secondary | ICD-10-CM

## 2020-11-25 DIAGNOSIS — I4892 Unspecified atrial flutter: Secondary | ICD-10-CM | POA: Diagnosis not present

## 2020-11-25 DIAGNOSIS — Z8673 Personal history of transient ischemic attack (TIA), and cerebral infarction without residual deficits: Secondary | ICD-10-CM | POA: Diagnosis not present

## 2020-11-25 LAB — POCT INR: INR: 2.2 (ref 2.0–3.0)

## 2020-11-25 NOTE — Patient Instructions (Signed)
-   continue dosage of 1 tablet (5 mg) every day - recheck INR in 3 weeks  Get your greens back on a schedule

## 2020-12-02 NOTE — Progress Notes (Signed)
Remote pacemaker transmission.   

## 2020-12-16 ENCOUNTER — Other Ambulatory Visit: Payer: Self-pay

## 2020-12-16 ENCOUNTER — Ambulatory Visit (INDEPENDENT_AMBULATORY_CARE_PROVIDER_SITE_OTHER): Payer: Medicare Other

## 2020-12-16 DIAGNOSIS — I480 Paroxysmal atrial fibrillation: Secondary | ICD-10-CM

## 2020-12-16 DIAGNOSIS — Z8673 Personal history of transient ischemic attack (TIA), and cerebral infarction without residual deficits: Secondary | ICD-10-CM | POA: Diagnosis not present

## 2020-12-16 DIAGNOSIS — Z5181 Encounter for therapeutic drug level monitoring: Secondary | ICD-10-CM | POA: Diagnosis not present

## 2020-12-16 DIAGNOSIS — I4891 Unspecified atrial fibrillation: Secondary | ICD-10-CM

## 2020-12-16 DIAGNOSIS — I4892 Unspecified atrial flutter: Secondary | ICD-10-CM

## 2020-12-16 LAB — POCT INR: INR: 1.1 — AB (ref 2.0–3.0)

## 2020-12-16 NOTE — Patient Instructions (Signed)
-   take 1.5 tablets warfarin tonight and tomorrow, then  - continue dosage of 1 tablet (5 mg) every day - recheck INR in 1 week  Get your greens back on a schedule - pick a day (or days) to have a serving of greens and them on the same day each week

## 2020-12-23 ENCOUNTER — Ambulatory Visit (INDEPENDENT_AMBULATORY_CARE_PROVIDER_SITE_OTHER): Payer: Medicare Other

## 2020-12-23 ENCOUNTER — Other Ambulatory Visit: Payer: Self-pay

## 2020-12-23 DIAGNOSIS — I4892 Unspecified atrial flutter: Secondary | ICD-10-CM | POA: Diagnosis not present

## 2020-12-23 DIAGNOSIS — I480 Paroxysmal atrial fibrillation: Secondary | ICD-10-CM | POA: Diagnosis not present

## 2020-12-23 DIAGNOSIS — Z5181 Encounter for therapeutic drug level monitoring: Secondary | ICD-10-CM

## 2020-12-23 DIAGNOSIS — Z8673 Personal history of transient ischemic attack (TIA), and cerebral infarction without residual deficits: Secondary | ICD-10-CM

## 2020-12-23 DIAGNOSIS — I4891 Unspecified atrial fibrillation: Secondary | ICD-10-CM

## 2020-12-23 LAB — POCT INR: INR: 1.7 — AB (ref 2.0–3.0)

## 2020-12-23 NOTE — Patient Instructions (Signed)
-   take 1.5 tablets warfarin tonight, then  - START NEW DOSAGE of 1 tablet (5 mg) every day EXCEPT 1.5 TABLETS ON MONDAYS & FRIDAYS - recheck INR in 2 weeks  Get your greens back on a schedule - pick a day (or days) to have a serving of greens and them on the same day each week

## 2020-12-26 ENCOUNTER — Other Ambulatory Visit: Payer: Self-pay | Admitting: Gastroenterology

## 2021-01-06 ENCOUNTER — Other Ambulatory Visit: Payer: Self-pay

## 2021-01-06 ENCOUNTER — Ambulatory Visit (INDEPENDENT_AMBULATORY_CARE_PROVIDER_SITE_OTHER): Payer: Medicare Other

## 2021-01-06 DIAGNOSIS — I4891 Unspecified atrial fibrillation: Secondary | ICD-10-CM | POA: Diagnosis not present

## 2021-01-06 DIAGNOSIS — I480 Paroxysmal atrial fibrillation: Secondary | ICD-10-CM

## 2021-01-06 DIAGNOSIS — I4892 Unspecified atrial flutter: Secondary | ICD-10-CM

## 2021-01-06 DIAGNOSIS — Z8673 Personal history of transient ischemic attack (TIA), and cerebral infarction without residual deficits: Secondary | ICD-10-CM | POA: Diagnosis not present

## 2021-01-06 DIAGNOSIS — Z5181 Encounter for therapeutic drug level monitoring: Secondary | ICD-10-CM | POA: Diagnosis not present

## 2021-01-06 LAB — POCT INR: INR: 1.3 — AB (ref 2.0–3.0)

## 2021-01-06 NOTE — Patient Instructions (Signed)
-   take 2 tablets warfarin tonight, then  - START NEW DOSAGE of 1.5 tablet (7.5 mg) every day EXCEPT 1 TABLETS ON MONDAYS, [place] - recheck INR in 1 week  WEEKLY INR CHECKS WHILE YOU ARE ON PREDNISONE  Get your greens back on a schedule - pick a day (or days) to have a serving of greens and them on the same day each week

## 2021-01-13 ENCOUNTER — Telehealth: Payer: Self-pay | Admitting: Internal Medicine

## 2021-01-13 ENCOUNTER — Ambulatory Visit (INDEPENDENT_AMBULATORY_CARE_PROVIDER_SITE_OTHER): Payer: Medicare Other

## 2021-01-13 ENCOUNTER — Other Ambulatory Visit: Payer: Self-pay

## 2021-01-13 DIAGNOSIS — I480 Paroxysmal atrial fibrillation: Secondary | ICD-10-CM | POA: Diagnosis not present

## 2021-01-13 DIAGNOSIS — I4892 Unspecified atrial flutter: Secondary | ICD-10-CM

## 2021-01-13 DIAGNOSIS — Z5181 Encounter for therapeutic drug level monitoring: Secondary | ICD-10-CM | POA: Diagnosis not present

## 2021-01-13 DIAGNOSIS — Z8673 Personal history of transient ischemic attack (TIA), and cerebral infarction without residual deficits: Secondary | ICD-10-CM

## 2021-01-13 DIAGNOSIS — I4891 Unspecified atrial fibrillation: Secondary | ICD-10-CM | POA: Diagnosis not present

## 2021-01-13 LAB — POCT INR: INR: 1.8 — AB (ref 2.0–3.0)

## 2021-01-13 NOTE — Telephone Encounter (Signed)
   Patient Name: Deborah Obrien  DOB [birth year]  Primary Cardiologist: [name], MD  Chart reviewed as part of pre-operative protocol coverage.   Simple dental extractions are considered low risk procedures per guidelines and generally do not require any specific cardiac clearance. It is also generally accepted that for simple extractions and dental cleanings, there is no need to interrupt blood thinner therapy.  SBE prophylaxis is not required for the patient from a cardiac standpoint.  I will route this recommendation to the requesting party via Epic fax function and remove from pre-op pool.  Please call with questions.  Ledora Bottcher, PA 01/13/2021, 5:48 PM

## 2021-01-13 NOTE — Telephone Encounter (Signed)
   [facility] Pre-operative Risk Assessment    Patient Name: Deborah Obrien  DOB: December 22, 1940  MRN: [id number]   HEARTCARE STAFF: - Please ensure there is not already an duplicate clearance open for this procedure. - Under Visit Info/Reason for Call, type in Other and utilize the format Clearance MM/DD/YY or Clearance TBD. Do not use dashes or single digits. - If request is for dental extraction, please clarify the # of teeth to be extracted.  Request for surgical clearance:  1. What type of surgery is being performed? One simple or surgical extraction  2. When is this surgery scheduled? TBD  3. What type of clearance is required (medical clearance vs. Pharmacy clearance to hold med vs. Both)? both  4. Are there any medications that need to be held prior to surgery and how long? Warfarin instructions   5. Practice name and name of physician performing surgery? [facility] - Tristan Schroeder, DDS   6. What is the office phone number? [phone]   7.   What is the office fax number? [phone]  8.   Anesthesia type (None, local, MAC, general) ? Not listed    Ace Gins 01/13/2021, 3:11 PM  _________________________________________________________________   (provider comments below)

## 2021-01-13 NOTE — Patient Instructions (Signed)
-   take 2 tablets warfarin tonight, then  - resume dosage of 1.5 tablet (7.5 mg) every day EXCEPT 1 TABLETS ON MONDAYS, [place] - recheck INR in 1 week  WEEKLY INR CHECKS WHILE YOU ARE ON PREDNISONE  Get your greens back on a schedule - pick a day (or days) to have a serving of greens and them on the same day each week

## 2021-01-19 DIAGNOSIS — N2581 Secondary hyperparathyroidism of renal origin: Secondary | ICD-10-CM | POA: Diagnosis not present

## 2021-01-19 DIAGNOSIS — I129 Hypertensive chronic kidney disease with stage 1 through stage 4 chronic kidney disease, or unspecified chronic kidney disease: Secondary | ICD-10-CM | POA: Diagnosis not present

## 2021-01-19 DIAGNOSIS — N179 Acute kidney failure, unspecified: Secondary | ICD-10-CM | POA: Diagnosis not present

## 2021-01-19 DIAGNOSIS — E1122 Type 2 diabetes mellitus with diabetic chronic kidney disease: Secondary | ICD-10-CM | POA: Diagnosis not present

## 2021-01-19 DIAGNOSIS — R300 Dysuria: Secondary | ICD-10-CM | POA: Diagnosis not present

## 2021-01-19 DIAGNOSIS — N1832 Chronic kidney disease, stage 3b: Secondary | ICD-10-CM | POA: Diagnosis not present

## 2021-01-19 DIAGNOSIS — N184 Chronic kidney disease, stage 4 (severe): Secondary | ICD-10-CM | POA: Diagnosis not present

## 2021-01-19 DIAGNOSIS — D508 Other iron deficiency anemias: Secondary | ICD-10-CM | POA: Diagnosis not present

## 2021-01-20 ENCOUNTER — Ambulatory Visit (INDEPENDENT_AMBULATORY_CARE_PROVIDER_SITE_OTHER): Payer: Medicare Other

## 2021-01-20 ENCOUNTER — Other Ambulatory Visit: Payer: Self-pay

## 2021-01-20 DIAGNOSIS — Z8673 Personal history of transient ischemic attack (TIA), and cerebral infarction without residual deficits: Secondary | ICD-10-CM | POA: Diagnosis not present

## 2021-01-20 DIAGNOSIS — Z5181 Encounter for therapeutic drug level monitoring: Secondary | ICD-10-CM | POA: Diagnosis not present

## 2021-01-20 DIAGNOSIS — I4891 Unspecified atrial fibrillation: Secondary | ICD-10-CM

## 2021-01-20 DIAGNOSIS — I480 Paroxysmal atrial fibrillation: Secondary | ICD-10-CM

## 2021-01-20 DIAGNOSIS — I4892 Unspecified atrial flutter: Secondary | ICD-10-CM

## 2021-01-20 LAB — POCT INR: INR: 1.7 — AB (ref 2.0–3.0)

## 2021-01-20 NOTE — Patient Instructions (Signed)
-   take 2 tablets warfarin tonight, then  - START NEW DOSAGE of warfarin of 1.5 tablet (7.5 mg) every day - recheck INR in 1 week  WEEKLY INR CHECKS WHILE YOU ARE ON PREDNISONE (until 5/26)  Get your greens back on a schedule - pick a day (or days) to have a serving of greens and them on the same day each week

## 2021-01-27 ENCOUNTER — Ambulatory Visit (INDEPENDENT_AMBULATORY_CARE_PROVIDER_SITE_OTHER): Payer: Medicare Other

## 2021-01-27 ENCOUNTER — Other Ambulatory Visit: Payer: Self-pay

## 2021-01-27 DIAGNOSIS — I4891 Unspecified atrial fibrillation: Secondary | ICD-10-CM

## 2021-01-27 DIAGNOSIS — I480 Paroxysmal atrial fibrillation: Secondary | ICD-10-CM

## 2021-01-27 DIAGNOSIS — Z5181 Encounter for therapeutic drug level monitoring: Secondary | ICD-10-CM | POA: Diagnosis not present

## 2021-01-27 DIAGNOSIS — I4892 Unspecified atrial flutter: Secondary | ICD-10-CM

## 2021-01-27 DIAGNOSIS — Z8673 Personal history of transient ischemic attack (TIA), and cerebral infarction without residual deficits: Secondary | ICD-10-CM

## 2021-01-27 LAB — POCT INR: INR: 2.6 (ref 2.0–3.0)

## 2021-01-27 NOTE — Patient Instructions (Signed)
-   continue dosage of warfarin of 1.5 tablet (7.5 mg) every day - recheck INR in 2 weeks

## 2021-02-03 DIAGNOSIS — R1013 Epigastric pain: Secondary | ICD-10-CM | POA: Diagnosis not present

## 2021-02-03 DIAGNOSIS — R109 Unspecified abdominal pain: Secondary | ICD-10-CM | POA: Diagnosis not present

## 2021-02-04 DIAGNOSIS — J84112 Idiopathic pulmonary fibrosis: Secondary | ICD-10-CM | POA: Diagnosis not present

## 2021-02-04 DIAGNOSIS — R1013 Epigastric pain: Secondary | ICD-10-CM | POA: Diagnosis not present

## 2021-02-04 DIAGNOSIS — J849 Interstitial pulmonary disease, unspecified: Secondary | ICD-10-CM | POA: Diagnosis not present

## 2021-02-10 ENCOUNTER — Ambulatory Visit (INDEPENDENT_AMBULATORY_CARE_PROVIDER_SITE_OTHER): Payer: Medicare Other

## 2021-02-10 ENCOUNTER — Telehealth: Payer: Self-pay | Admitting: Emergency Medicine

## 2021-02-10 ENCOUNTER — Other Ambulatory Visit: Payer: Self-pay

## 2021-02-10 DIAGNOSIS — Z5181 Encounter for therapeutic drug level monitoring: Secondary | ICD-10-CM | POA: Diagnosis not present

## 2021-02-10 DIAGNOSIS — I4891 Unspecified atrial fibrillation: Secondary | ICD-10-CM

## 2021-02-10 DIAGNOSIS — I480 Paroxysmal atrial fibrillation: Secondary | ICD-10-CM | POA: Diagnosis not present

## 2021-02-10 DIAGNOSIS — Z8673 Personal history of transient ischemic attack (TIA), and cerebral infarction without residual deficits: Secondary | ICD-10-CM | POA: Diagnosis not present

## 2021-02-10 DIAGNOSIS — I4892 Unspecified atrial flutter: Secondary | ICD-10-CM | POA: Diagnosis not present

## 2021-02-10 LAB — POCT INR: INR: 1.8 — AB (ref 2.0–3.0)

## 2021-02-10 MED ORDER — WARFARIN SODIUM 5 MG PO TABS: 5.0000 mg | ORAL_TABLET | Freq: Every day | ORAL | 3 refills | Status: DC

## 2021-02-10 NOTE — Telephone Encounter (Signed)
Wow   really cool Thanks SK

## 2021-02-10 NOTE — Patient Instructions (Addendum)
Since you are on Cipro, take your warfarin today - continue dosage of warfarin of 1.5 tablet (7.5 mg) every day - recheck INR in 2 weeks   Get your greens back on a schedule - pick a day (or days) to have a serving of greens and them on the same day each week

## 2021-02-10 NOTE — Telephone Encounter (Signed)
Received unscheduled transmission from patient with 1 episode of AF on 01/19/21 @ 1407 that received ATP X 76 which converted the rhythm to AP/VP . Available EGM shows AFL with Atrial rate in 200's  that was successfully converted by ATP.No other events. Device function WNL. Patient reports she has been under a lot of stress due to family issues and has had generalized fatigue with no other s/sx. She reports she felt no CP, dizziness, syncope, chest pressure or SOB. She reports she was having a stressful family issue on 01/19/21 and felt her heart racing, no other s/sx reported that day. Patient reports PCP appointment today. ED precautions given and confirmed patient is taking medications as directed with no omissions.+ Coumadin.

## 2021-02-11 ENCOUNTER — Other Ambulatory Visit: Payer: Self-pay | Admitting: Pulmonary Disease

## 2021-02-11 DIAGNOSIS — J849 Interstitial pulmonary disease, unspecified: Secondary | ICD-10-CM

## 2021-02-11 DIAGNOSIS — J84112 Idiopathic pulmonary fibrosis: Secondary | ICD-10-CM

## 2021-02-22 ENCOUNTER — Telehealth: Payer: Self-pay | Admitting: Internal Medicine

## 2021-02-22 NOTE — Telephone Encounter (Signed)
Patient spouse came by office to drop off out of pocket costs for medications  Placed in nurse box  Please call patient when received

## 2021-02-23 ENCOUNTER — Ambulatory Visit (INDEPENDENT_AMBULATORY_CARE_PROVIDER_SITE_OTHER): Payer: Medicare Other

## 2021-02-23 DIAGNOSIS — I442 Atrioventricular block, complete: Secondary | ICD-10-CM

## 2021-02-23 LAB — CUP PACEART REMOTE DEVICE CHECK
Battery Remaining Longevity: 109 mo
Battery Voltage: 2.93 V
Brady Statistic AP VP Percent: 0.03 %
Brady Statistic AP VS Percent: 96.06 %
Brady Statistic AS VP Percent: 0 %
Brady Statistic AS VS Percent: 3.9 %
Brady Statistic RA Percent Paced: 96.1 %
Brady Statistic RV Percent Paced: 0.03 %
Date Time Interrogation Session: 20220614013345
Implantable Lead Implant Date: 20161019
Implantable Lead Implant Date: 20181217
Implantable Lead Location: 753859
Implantable Lead Location: 753860
Implantable Lead Model: 5076
Implantable Lead Model: 5076
Implantable Pulse Generator Implant Date: 20181217
Lead Channel Impedance Value: 285 Ohm
Lead Channel Impedance Value: 323 Ohm
Lead Channel Impedance Value: 342 Ohm
Lead Channel Impedance Value: 380 Ohm
Lead Channel Pacing Threshold Amplitude: 0.75 V
Lead Channel Pacing Threshold Amplitude: 0.875 V
Lead Channel Pacing Threshold Pulse Width: 0.4 ms
Lead Channel Pacing Threshold Pulse Width: 0.4 ms
Lead Channel Sensing Intrinsic Amplitude: 4 mV
Lead Channel Sensing Intrinsic Amplitude: 4 mV
Lead Channel Sensing Intrinsic Amplitude: 4.375 mV
Lead Channel Sensing Intrinsic Amplitude: 4.375 mV
Lead Channel Setting Pacing Amplitude: 2 V
Lead Channel Setting Pacing Amplitude: 2.5 V
Lead Channel Setting Pacing Pulse Width: 0.4 ms
Lead Channel Setting Sensing Sensitivity: 1.2 mV

## 2021-02-23 NOTE — Telephone Encounter (Signed)
I called and spoke with Mr. Allegra (ok per [facility]). I advised him that I received the copy of the pharmacy expense reports that he dropped off. He is also aware that I have faxed these to [employer] patient assistance for eliquis. Faxed to [phone]>> confirmation received.   I have advised Mr. Busbey since they should already have the renewal application on file that I faxed in December & again in February, that if they approved the patient's eliquis and send this to her, she should not start this without talking with our office first. That way we can transition her back off the coumadin and on to eliquis.  Mr. Goodin voices understanding and is agreeable.

## 2021-02-24 ENCOUNTER — Other Ambulatory Visit: Payer: Self-pay

## 2021-02-24 ENCOUNTER — Ambulatory Visit (INDEPENDENT_AMBULATORY_CARE_PROVIDER_SITE_OTHER): Payer: Medicare Other

## 2021-02-24 DIAGNOSIS — I4891 Unspecified atrial fibrillation: Secondary | ICD-10-CM | POA: Diagnosis not present

## 2021-02-24 DIAGNOSIS — Z5181 Encounter for therapeutic drug level monitoring: Secondary | ICD-10-CM | POA: Diagnosis not present

## 2021-02-24 DIAGNOSIS — I4892 Unspecified atrial flutter: Secondary | ICD-10-CM

## 2021-02-24 DIAGNOSIS — Z8673 Personal history of transient ischemic attack (TIA), and cerebral infarction without residual deficits: Secondary | ICD-10-CM | POA: Diagnosis not present

## 2021-02-24 DIAGNOSIS — I480 Paroxysmal atrial fibrillation: Secondary | ICD-10-CM | POA: Diagnosis not present

## 2021-02-24 LAB — POCT INR: INR: 2 (ref 2.0–3.0)

## 2021-02-24 NOTE — Patient Instructions (Signed)
-   continue dosage of warfarin of 1.5 tablet (7.5 mg) every day - recheck INR in 2 weeks   Get your greens back on a schedule - pick a day (or days) to have a serving of greens and them on the same day each week

## 2021-02-26 ENCOUNTER — Ambulatory Visit
Admission: RE | Admit: 2021-02-26 | Discharge: 2021-02-26 | Disposition: A | Discharge status: Home / Self Care | Payer: Medicare Other | Source: Ambulatory Visit | Attending: Pulmonary Disease | Admitting: Pulmonary Disease

## 2021-02-26 ENCOUNTER — Other Ambulatory Visit: Payer: Self-pay

## 2021-02-26 DIAGNOSIS — J84112 Idiopathic pulmonary fibrosis: Secondary | ICD-10-CM | POA: Diagnosis not present

## 2021-02-26 DIAGNOSIS — R0602 Shortness of breath: Secondary | ICD-10-CM | POA: Diagnosis not present

## 2021-02-26 DIAGNOSIS — J849 Interstitial pulmonary disease, unspecified: Secondary | ICD-10-CM

## 2021-03-03 NOTE — Telephone Encounter (Signed)
Patient want to discuss eliquis

## 2021-03-04 NOTE — Telephone Encounter (Signed)
I spoke with the patient's husband. He called to follow up if I had heard anything from [employer] about the patient's eliquis assistance application.  I advised him that I faxed the pharmacy expense reports on 02/23/21 and received a fax confirmation these went through. However, I have not heard anything else. I advised Mr. Demello that it could take 10-14+ business days for [place] to hear anything back. I have also advised he may call the company directly to inquire on the status if he would like to.  Mr. Chiang voices understanding and is agreeable.

## 2021-03-10 ENCOUNTER — Ambulatory Visit (INDEPENDENT_AMBULATORY_CARE_PROVIDER_SITE_OTHER): Payer: Medicare Other | Admitting: Pharmacist

## 2021-03-10 ENCOUNTER — Other Ambulatory Visit: Payer: Self-pay

## 2021-03-10 DIAGNOSIS — I4892 Unspecified atrial flutter: Secondary | ICD-10-CM

## 2021-03-10 DIAGNOSIS — I4891 Unspecified atrial fibrillation: Secondary | ICD-10-CM

## 2021-03-10 DIAGNOSIS — Z5181 Encounter for therapeutic drug level monitoring: Secondary | ICD-10-CM | POA: Diagnosis not present

## 2021-03-10 DIAGNOSIS — Z8673 Personal history of transient ischemic attack (TIA), and cerebral infarction without residual deficits: Secondary | ICD-10-CM | POA: Diagnosis not present

## 2021-03-10 DIAGNOSIS — I480 Paroxysmal atrial fibrillation: Secondary | ICD-10-CM | POA: Diagnosis not present

## 2021-03-10 LAB — POCT INR: INR: 3.7 — AB (ref 2.0–3.0)

## 2021-03-10 NOTE — Patient Instructions (Signed)
Description   -Hold dose today and only take 1 tablet (5 mg) tomorrow - continue dosage of warfarin of 1.5 tablet (7.5 mg) every day - recheck INR in 2 weeks   Get your greens back on a schedule - pick a day (or days) to have a serving of greens and them on the same day each week

## 2021-03-11 ENCOUNTER — Telehealth: Payer: Self-pay | Admitting: Internal Medicine

## 2021-03-11 NOTE — Telephone Encounter (Signed)
Call received from [facility] with [employer]. She advised she was calling on behalf of the patient/ per his request.  She was stating that they were only calculating the itemized reports that were received from [employer] as the out of pocket expense being met>> they were unable to factor in the [facility] figure that the patient brought me as this was not an itemized figure. Beckie Busing was inquiring if I had this page. I advised Monique that I do not have an itemized [employer] expense report. Monique advised she will notify the patient's spouse that this is what is needed because she has technically exceeded what is required for her out of pocket cost.

## 2021-03-11 NOTE — Telephone Encounter (Signed)
I spoke with the patient's husband. He advised that he called [employer] and advised that Deborah Obrien had not received her eliquis yet. He was told by [employer] rep to call back in ~ 3 hours and they would see what they could find out about her application.  Per Deborah Obrien, he is requesting that I call them back. I advised I will attempt to call them later today to see what I can find out as I have faxed this application twice already.   1st fax- 09/02/20 [phone] & confirmation received. 2nd fax- 10/23/20 [phone] & confirmation received 3rd fax- 02/23/21 [phone] pharmacy expense reports faxed & confirmation received

## 2021-03-11 NOTE — Telephone Encounter (Signed)
I called and spoke with Elta Guadeloupe at [facility] [employer]. I advised Elta Guadeloupe that I had submitted a renewal application for the patient twice for this year for her eliquis and just recently sent in her pharmacy expense report. I was calling to inquire the status of her application. Per Elta Guadeloupe, he did see the application on file as well as the expense report, however, the patient's out of pocket expenses were $7.24 short for her to qualify for assistance.  I have called the patient and her husband and advised them of the above information. Mr. Degler was asking that I call back to clarify the expenses that were submitted. I have advised that he will need to call [employer] directly as I am unsure if they are looking for a certain % of out of pocket cost and they can advise him directly.  The patient's husband voices understanding and is agreeable.

## 2021-03-11 NOTE — Telephone Encounter (Signed)
Patient's spouse ask the we call [place] regarding Eliquis.States they have not received the application. Please call to discuss

## 2021-03-16 DIAGNOSIS — R0602 Shortness of breath: Secondary | ICD-10-CM | POA: Diagnosis not present

## 2021-03-16 DIAGNOSIS — Z01818 Encounter for other preprocedural examination: Secondary | ICD-10-CM | POA: Diagnosis not present

## 2021-03-16 NOTE — Progress Notes (Signed)
Remote pacemaker transmission.   

## 2021-03-17 DIAGNOSIS — I878 Other specified disorders of veins: Secondary | ICD-10-CM | POA: Diagnosis not present

## 2021-03-17 DIAGNOSIS — M1611 Unilateral primary osteoarthritis, right hip: Secondary | ICD-10-CM | POA: Diagnosis not present

## 2021-03-17 DIAGNOSIS — M25551 Pain in right hip: Secondary | ICD-10-CM | POA: Diagnosis not present

## 2021-03-17 DIAGNOSIS — M8588 Other specified disorders of bone density and structure, other site: Secondary | ICD-10-CM | POA: Diagnosis not present

## 2021-03-17 DIAGNOSIS — R102 Pelvic and perineal pain: Secondary | ICD-10-CM | POA: Diagnosis not present

## 2021-03-17 NOTE — Telephone Encounter (Signed)
Patient spouse calling  Would like to speak with Nira Conn about Eliquis and warfarin  Please call to discuss

## 2021-03-18 NOTE — Telephone Encounter (Signed)
I spoke with the patient and her husband. They advised they received Eliquis from [place] patient assistance yesterday.   I have asked them not to start this yet. They are aware I am forwarding a message to the anticoagulation clinic and they will reach out to them about transitioning the back off of her warfarin and back onto the eliquis.  The patient and her husband voice understanding and are agreeable.  She is scheduled for a follow up INR on 03/24/21 in the [place] office.

## 2021-03-18 NOTE — Telephone Encounter (Signed)
Spoke with pt.  She has enough warfarin to last her till her INR appt 03/24/21 in [place].  Informed her INR would be checked then and she would be transitioned to Eliquis at that time based on her reading.  She was appreciative of call and verbalized understanding.

## 2021-03-18 NOTE — Telephone Encounter (Signed)
Needs pharmd review will route to the pool

## 2021-03-24 ENCOUNTER — Ambulatory Visit (INDEPENDENT_AMBULATORY_CARE_PROVIDER_SITE_OTHER): Payer: Medicare Other

## 2021-03-24 ENCOUNTER — Other Ambulatory Visit: Payer: Self-pay

## 2021-03-24 DIAGNOSIS — Z5181 Encounter for therapeutic drug level monitoring: Secondary | ICD-10-CM | POA: Diagnosis not present

## 2021-03-24 DIAGNOSIS — I4892 Unspecified atrial flutter: Secondary | ICD-10-CM | POA: Diagnosis not present

## 2021-03-24 DIAGNOSIS — Z8673 Personal history of transient ischemic attack (TIA), and cerebral infarction without residual deficits: Secondary | ICD-10-CM

## 2021-03-24 DIAGNOSIS — I4891 Unspecified atrial fibrillation: Secondary | ICD-10-CM | POA: Diagnosis not present

## 2021-03-24 DIAGNOSIS — I480 Paroxysmal atrial fibrillation: Secondary | ICD-10-CM

## 2021-03-24 LAB — POCT INR: INR: 4.1 — AB (ref 2.0–3.0)

## 2021-03-24 MED ORDER — APIXABAN 2.5 MG PO TABS: 2.5000 mg | ORAL_TABLET | Freq: Two times a day (BID) | ORAL | 1 refills | Status: DC

## 2021-03-24 NOTE — Patient Instructions (Signed)
-  STOP WARFARIN - HAVE A SERVING OF GREENS TONIGHT - ON Saturday, START ELIQUIS 2.5 mg TWICE DAILY - Since you have 5 mg tablets at home, please cut them in 1/2 until you pick up your next rx for 2.5 mg tablets

## 2021-03-24 NOTE — Progress Notes (Signed)
Pt was started on Eliquis for afib on March 24, 2021  Reviewed patients medication list.  Pt is not currently on any combined P-gp and strong CYP3A4 inhibitors/inducers (ketoconazole, traconazole, ritonavir, carbamazepine, phenytoin, rifampin, St. [name]'s wort).  Reviewed labs.  SCr 1.8, Weight 90.7, CrCl- 35.69.  Dose appropriate based on age, weight, and SCr.  Hgb and HCT Within Normal Limits  A full discussion of the nature of anticoagulants has been carried out.  A benefit/risk analysis has been presented to the patient, so that they understand the justification for choosing anticoagulation with Eliquis at this time.  The need for compliance is stressed.  Pt is aware to take the medication twice daily.  Side effects of potential bleeding are discussed, including unusual colored urine or stools, coughing up blood or coffee ground emesis, nose bleeds or serious fall or head trauma.  Discussed signs and symptoms of stroke. The patient should avoid any OTC items containing aspirin or ibuprofen.  Avoid alcohol consumption.   Call if any signs of abnormal bleeding.  Discussed financial obligations and resolved any difficulty in obtaining medication.  Next lab test in 6 months.

## 2021-04-02 ENCOUNTER — Emergency Department
Admission: EM | Admit: 2021-04-02 | Discharge: 2021-04-03 | Disposition: A | Discharge status: Home / Self Care | Payer: Medicare Other | Attending: Emergency Medicine | Admitting: Emergency Medicine

## 2021-04-02 DIAGNOSIS — S9031XA Contusion of right foot, initial encounter: Secondary | ICD-10-CM | POA: Insufficient documentation

## 2021-04-02 DIAGNOSIS — I4891 Unspecified atrial fibrillation: Secondary | ICD-10-CM | POA: Insufficient documentation

## 2021-04-02 DIAGNOSIS — Z7901 Long term (current) use of anticoagulants: Secondary | ICD-10-CM | POA: Insufficient documentation

## 2021-04-02 DIAGNOSIS — W208XXA Other cause of strike by thrown, projected or falling object, initial encounter: Secondary | ICD-10-CM | POA: Diagnosis not present

## 2021-04-02 DIAGNOSIS — S14101D Unspecified injury at C1 level of cervical spinal cord, subsequent encounter: Secondary | ICD-10-CM | POA: Diagnosis not present

## 2021-04-02 DIAGNOSIS — Z96653 Presence of artificial knee joint, bilateral: Secondary | ICD-10-CM | POA: Diagnosis not present

## 2021-04-02 DIAGNOSIS — N1831 Chronic kidney disease, stage 3a: Secondary | ICD-10-CM | POA: Insufficient documentation

## 2021-04-02 DIAGNOSIS — I2510 Atherosclerotic heart disease of native coronary artery without angina pectoris: Secondary | ICD-10-CM | POA: Diagnosis not present

## 2021-04-02 DIAGNOSIS — E039 Hypothyroidism, unspecified: Secondary | ICD-10-CM | POA: Insufficient documentation

## 2021-04-02 DIAGNOSIS — I130 Hypertensive heart and chronic kidney disease with heart failure and stage 1 through stage 4 chronic kidney disease, or unspecified chronic kidney disease: Secondary | ICD-10-CM | POA: Diagnosis not present

## 2021-04-02 DIAGNOSIS — Z950 Presence of cardiac pacemaker: Secondary | ICD-10-CM | POA: Insufficient documentation

## 2021-04-02 DIAGNOSIS — I509 Heart failure, unspecified: Secondary | ICD-10-CM | POA: Diagnosis not present

## 2021-04-02 DIAGNOSIS — Z79899 Other long term (current) drug therapy: Secondary | ICD-10-CM | POA: Diagnosis not present

## 2021-04-02 DIAGNOSIS — S99921A Unspecified injury of right foot, initial encounter: Secondary | ICD-10-CM | POA: Diagnosis not present

## 2021-04-02 DIAGNOSIS — Z794 Long term (current) use of insulin: Secondary | ICD-10-CM | POA: Diagnosis not present

## 2021-04-02 DIAGNOSIS — E1122 Type 2 diabetes mellitus with diabetic chronic kidney disease: Secondary | ICD-10-CM | POA: Diagnosis not present

## 2021-04-03 ENCOUNTER — Other Ambulatory Visit: Payer: Self-pay

## 2021-04-03 ENCOUNTER — Emergency Department: Payer: Medicare Other

## 2021-04-03 DIAGNOSIS — S99921A Unspecified injury of right foot, initial encounter: Secondary | ICD-10-CM | POA: Diagnosis not present

## 2021-04-03 LAB — CBG MONITORING, ED
Glucose-Capillary: 95 mg/dL (ref 70–99)
Glucose-Capillary: 134 mg/dL — ABNORMAL HIGH (ref 70–99)

## 2021-04-03 MED ORDER — HYDROCODONE-ACETAMINOPHEN 5-325 MG PO TABS
1.0000 | ORAL_TABLET | Freq: Once | ORAL | Status: AC
  Administered 2021-04-03: 1 via ORAL
  Filled 2021-04-03: qty 1

## 2021-04-03 MED ORDER — LIDOCAINE 5 % EX PTCH: 1.0000 | MEDICATED_PATCH | Freq: Two times a day (BID) | CUTANEOUS | 0 refills | Status: DC

## 2021-04-03 NOTE — ED Provider Notes (Signed)
[facility] Emergency Department Provider Note   ____________________________________________   Event Date/Time   First MD Initiated Contact with Patient 04/03/21 0515     (approximate)  I have reviewed the triage vital signs and the nursing notes.   HISTORY  Chief Complaint Foot Injury    HPI Deborah Obrien is a 80 y.o. female with past medical history of hypertension, hyperlipidemia, diabetes, CKD, and paroxysmal atrial fibrillation on Eliquis who presents to the ED complaining of foot pain.  Patient reports that a couple hours prior to arrival she was moving something in the closet when she dropped a weight on the top of her right foot.  Since then, she has had increasing pain and swelling, states she has been unable to bear weight on her right foot.  She denies noted pain in her ankle or proximal portion of her right lower extremity.  She took a dose of Norco at home without significant relief.        Past Medical History:  Diagnosis Date   A-fib ([place]) 2016   Acid reflux    Anemia    Anxiety    Bowel obstruction (HCC)    Carotid artery disease (HCC)    CHF (congestive heart failure) ([place]) 2017   Chronic kidney disease (CKD) stage G3a/A1, moderately decreased glomerular filtration rate (GFR) between 45-59 mL/min/1.73 square meter and albuminuria creatinine ratio less than 30 mg/g (HCC)    stage IV   Coronary artery disease    Depression    Diabetes mellitus without complication (HCC)    Dyspnea    Dysrhythmia    PAROXYSMAL ATRIAL FIBRILLATION   Fatty liver    GI bleed    Gout    Hypertension    Hypothyroidism    Low back pain    history of kyphoplasty t12-l1   Morbid obesity (HCC)    Multiple gastric polyps    history of gi bleed.  polyps removed   Neuromuscular disorder ([place])    diabetic neuropathy   Osteoarthritis    Osteoporosis    Paroxysmal atrial fibrillation (HCC)    Presence of permanent cardiac pacemaker    Sinus node  dysfunction ([place]) 08/24/2017   Stroke ([place])    TIA, patient unaware that she had it. no residual   TIA (transient ischemic attack)     Patient Active Problem List   Diagnosis Date Noted   Osteoporosis 02/13/2020   Primary osteoarthritis of left shoulder 11/10/2019   Tendinitis of upper biceps tendon of left shoulder 11/10/2019   Uses walker 09/27/2018   RUQ pain    UTI (urinary tract infection) 06/14/2018   Type 2 diabetes mellitus with neurological complications ([place]) [id number]   Acute respiratory failure ([place]) 04/09/2018   Chronic anticoagulation 04/09/2018   Pacemaker 04/09/2018   H/O TIA (transient ischemic attack) and stroke 04/09/2018   CAD (coronary artery disease) 04/09/2018   Hematemesis 04/05/2018   Hypotension 02/04/2018   Healthcare maintenance 09/22/2017   Atrial flutter ([place]) 08/24/2017   Sinus node dysfunction ([place]) 08/24/2017   SOBOE (shortness of breath on exertion) 06/26/2017   Dizziness 06/12/2017   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   Mastalgia 02/14/2017   Acute on chronic renal insufficiency 01/09/2017   Fatty liver 01/09/2017   Anemia in stage 3 chronic kidney disease ([place]) 11/02/2016   Iron deficiency anemia 11/02/2016   Benign essential HTN 09/08/2016   Nephrolithiasis 02/22/2016   Diabetic neuropathy with neurologic complication ([place]) [id number]  Moderate episode of recurrent major depressive disorder ([place]) 08/11/2015   Recurrent major depressive disorder, in remission ([place]) 08/11/2015   Polyp of stomach and duodenum 07/29/2015   Gastritis without bleeding    Gastric polyp    Gastro-esophageal reflux disease without esophagitis    PAF (paroxysmal atrial fibrillation) ([place]) 06/29/2015   Intestinal obstruction ([place]) 02/20/2015   DDD (degenerative disc disease), lumbar 01/19/2015   Back pain 01/12/2015   Gout 01/12/2015   Other specified postprocedural states 11/04/2014   Complete tear of left rotator cuff 10/16/2014    Intervertebral disc disorder with radiculopathy of lumbar region 09/17/2014   Lumbar radiculitis 09/17/2014   Lumbar stenosis with neurogenic claudication 09/17/2014   Neuritis or radiculitis due to rupture of lumbar intervertebral disc 09/17/2014   Atherosclerosis of both carotid arteries 07/15/2014   Essential hypertension 07/15/2014   Hyperlipemia, mixed 07/15/2014   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   Body mass index (BMI) of 40.0-44.9 in adult [facility]) 06/21/2014   Mixed incontinence 06/25/2013   Mixed urge and stress incontinence 06/25/2013   Nocturia 06/25/2013   Blurring of visual image 04/11/2012   Other visual disturbances 04/11/2012    Past Surgical History:  Procedure Laterality Date   [place]     2008. plate & screws    BACK SURGERY  2019   patient describes kyphoplasty for compression fractures, MD referral said fusion   BREAST BIOPSY Right 08/16   stereo fibroadenomatous change, neg for atypia   BREAST EXCISIONAL BIOPSY Left    neg   CARDIAC CATHETERIZATION Left 08/25/2016   Procedure: Left Heart Cath and Coronary Angiography;  Surgeon: Corey Skains, MD;  Location: [place] CV LAB;  Service: Cardiovascular;  Laterality: Left;   CARDIOVERSION N/A 06/28/2017   Procedure: CARDIOVERSION;  Surgeon: Corey Skains, MD;  Location: [facility] ORS;  Service: Cardiovascular;  Laterality: N/A;   CARDIOVERSION N/A 02/06/2018   Procedure: CARDIOVERSION;  Surgeon: Josue Hector, MD;  Location: [facility] ENDOSCOPY;  Service: Cardiovascular;  Laterality: N/A;   CARDIOVERSION N/A 07/15/2020   Procedure: CARDIOVERSION;  Surgeon: Jerline Pain, MD;  Location: [facility] ENDOSCOPY;  Service: Cardiovascular;  Laterality: N/A;   CATARACT EXTRACTION, BILATERAL  2012   CHOLECYSTECTOMY     colon blockage  [place]   removed 20% of colon. colon had collapsed   COLONOSCOPY  2016   polyps removed 2016   DORSAL  COMPARTMENT RELEASE Left 09/14/2016   Procedure: RELEASE DORSAL COMPARTMENT (DEQUERVAIN);  Surgeon: Dereck Leep, MD;  Location: [facility] ORS;  Service: Orthopedics;  Laterality: Left;   ESOPHAGOGASTRODUODENOSCOPY N/A 01/27/2015   Procedure: ESOPHAGOGASTRODUODENOSCOPY (EGD);  Surgeon: Lollie Sails, MD;  Location: [facility] ENDOSCOPY;  Service: Endoscopy;  Laterality: N/A;   ESOPHAGOGASTRODUODENOSCOPY (EGD) WITH PROPOFOL N/A 07/24/2015   Procedure: ESOPHAGOGASTRODUODENOSCOPY (EGD) WITH PROPOFOL;  Surgeon: Lucilla Lame, MD;  Location: [facility] ENDOSCOPY;  Service: Endoscopy;  Laterality: N/A;   ESOPHAGOGASTRODUODENOSCOPY (EGD) WITH PROPOFOL N/A 04/12/2018   Procedure: ESOPHAGOGASTRODUODENOSCOPY (EGD) WITH PROPOFOL;  Surgeon: Jonathon Bellows, MD;  Location: [facility] ENDOSCOPY;  Service: Gastroenterology;  Laterality: N/A;   ESOPHAGOGASTRODUODENOSCOPY (EGD) WITH PROPOFOL N/A 06/22/2018   Procedure: ESOPHAGOGASTRODUODENOSCOPY (EGD) WITH PROPOFOL;  Surgeon: [name], MD;  Location: [facility] ENDOSCOPY;  Service: Endoscopy;  Laterality: N/A;   ESOPHAGOGASTRODUODENOSCOPY (EGD) WITH PROPOFOL N/A 07/09/2018   Procedure: ESOPHAGOGASTRODUODENOSCOPY (EGD) WITH PROPOFOL with resection of gastric polyps;  Surgeon: Jonathon Bellows, MD;  Location: [facility] ENDOSCOPY;  Service: Gastroenterology;  Laterality: N/A;   ESOPHAGOGASTRODUODENOSCOPY (EGD) WITH PROPOFOL N/A 05/17/2019   Procedure: ESOPHAGOGASTRODUODENOSCOPY (EGD) WITH PROPOFOL;  Surgeon: Jonathon Bellows, MD;  Location: [facility] ENDOSCOPY;  Service: Gastroenterology;  Laterality: N/A;   EUS N/A 06/22/2018   Procedure: UPPER ENDOSCOPIC ULTRASOUND (EUS) RADIAL;  Surgeon: [name], MD;  Location: [facility] ENDOSCOPY;  Service: Endoscopy;  Laterality: N/A;   EYE SURGERY Bilateral    cataract extractions   JOINT REPLACEMENT Bilateral 2014   Bilateral Knee replacement   LEAD REVISION/REPAIR N/A 08/29/2017   Procedure: LEAD REVISION/REPAIR;  Surgeon: Constance Haw, MD;  Location: [place]  CV LAB;  Service: Cardiovascular;  Laterality: N/A;   OOPHORECTOMY     PACEMAKER INSERTION Left 07/01/2015   Procedure: INSERTION PACEMAKER;  Surgeon: Isaias Cowman, MD;  Location: [facility] ORS;  Service: Cardiovascular;  Laterality: Left;   PACEMAKER REVISION N/A 08/28/2017   Procedure: PACEMAKER REVISION;  Surgeon: Deboraha Sprang, MD;  Location: [place] CV LAB;  Service: Cardiovascular;  Laterality: N/A;   REPLACEMENT TOTAL KNEE BILATERAL     SHOULDER ARTHROSCOPY WITH ROTATOR CUFF REPAIR AND OPEN BICEPS TENODESIS Left 11/21/2019   Procedure: LEFT SHOULDER ARTHROSCOPY WITH DEBRIDEMENT, DECOMPRESSION, ROTATOR CUFF REPAIR AND BICEPS TENOLYSIS;  Surgeon: Corky Mull, MD;  Location: [facility] ORS;  Service: Orthopedics;  Laterality: Left;   SHOULDER ARTHROSCOPY WITH SUBACROMIAL DECOMPRESSION Left 2013   TEE WITHOUT CARDIOVERSION N/A 06/28/2017   Procedure: TRANSESOPHAGEAL ECHOCARDIOGRAM (TEE);  Surgeon: Corey Skains, MD;  Location: [facility] ORS;  Service: Cardiovascular;  Laterality: N/A;    Prior to Admission medications   Medication Sig Start Date End Date Taking? Authorizing Provider  lidocaine (LIDODERM) 5 % Place 1 patch onto the skin every 12 (twelve) hours. Remove & Discard patch within 12 hours or as directed by MD 04/03/21 04/03/22 Yes Blake Divine, MD  allopurinol (ZYLOPRIM) 100 MG tablet Take 150 mg by mouth daily.     [provider]  amiodarone (PACERONE) 200 MG tablet Take 1 tablet (200 mg total) by mouth daily. 05/26/20   Deboraha Sprang, MD  apixaban (ELIQUIS) 2.5 MG TABS tablet Take 1 tablet (2.5 mg total) by mouth 2 (two) times daily. 03/24/21   Deboraha Sprang, MD  busPIRone (BUSPAR) 10 MG tablet Take 10 mg by mouth 2 (two) times daily.     [provider]  cholecalciferol (VITAMIN D3) 10 MCG (400 UNIT) TABS tablet Take 400 Units by mouth in the morning and at bedtime.    [provider]  colchicine 0.6 MG tablet Take 0.6 mg by mouth daily as needed  (gout).     [provider]  docusate sodium (COLACE) 100 MG capsule Take 200 mg by mouth 2 (two) times daily.    [provider]  DULoxetine (CYMBALTA) 30 MG capsule Take 30 mg by mouth daily.  03/25/19 07/13/21  [provider]  furosemide (LASIX) 20 MG tablet Take 20 mg by mouth every other day. In the morning    [provider]  gabapentin (NEURONTIN) 600 MG tablet Take 1,200 mg by mouth 3 (three) times daily.     [provider]  HYDROcodone-acetaminophen (NORCO) 10-325 MG tablet Take 1 tablet by mouth every 4 (four) hours as needed for moderate pain. Patient taking differently: Take 1 tablet by mouth every 6 (six) hours as needed for moderate pain. 11/21/19   Poggi, Marshall Cork, MD  insulin NPH-regular Human (NOVOLIN 70/30) (70-30) 100 UNIT/ML injection Inject 25 Units into the  skin 2 (two) times daily before a meal. Before breakfast & before supper    [provider]  ketoconazole (NIZORAL) 2 % cream Apply 1 application topically daily as needed (rash/skin irritation.).     [provider]  levothyroxine (SYNTHROID) 50 MCG tablet Take 50 mcg by mouth daily before breakfast.    [provider]  oxybutynin (DITROPAN) 5 MG tablet Take 10 mg by mouth 2 (two) times daily. Breakfast & bedtime    [provider]  pantoprazole (PROTONIX) 40 MG tablet Take 1 tablet by mouth once daily Patient taking differently: Take 40 mg by mouth daily with breakfast. 06/22/20   Jonathon Bellows, MD  Polyethyl Glycol-Propyl Glycol 0.4-0.3 % SOLN Place 1-2 drops into both eyes 4 (four) times daily as needed (dry eyes).     [provider]  pravastatin (PRAVACHOL) 20 MG tablet Take 20 mg by mouth at bedtime.  07/25/18   [provider]  talc powder Apply 1 application topically as needed (under the breast irritation).    [provider]  traZODone (DESYREL) 50 MG tablet Take 50 mg by mouth at bedtime.  06/07/18   [provider]  vitamin B-12 (CYANOCOBALAMIN) 1000 MCG tablet Take 1,000 mcg by mouth daily.    [provider]  vitamin C (ASCORBIC ACID) 500 MG tablet Take 500 mg by mouth 2 (two) times daily.    [provider]    Allergies Fish allergy, Macrolides and ketolides, Meperidine, Other, Prednisone, Shellfish allergy, Sulfa antibiotics, Sulfacetamide sodium, Telbivudine, Uloric [febuxostat], Aspirin, Celecoxib, Cephalexin, Codeine, Dilaudid [hydromorphone hcl], Erythromycin, Iodinated diagnostic agents, Lipitor [atorvastatin calcium], Oxycodone, Aleve [naproxen], Atorvastatin, Cephalosporins, Doxycycline, Iodine, Motrin [ibuprofen], Tape, and Valdecoxib  Family History  Problem Relation Age of Onset   Diabetes Mellitus II Mother    CAD Mother    Heart attack Mother    Cancer Father        skin   Breast cancer Neg Hx     Social History Social History   Tobacco Use   Smoking status: Never   Smokeless tobacco: Never  Vaping Use   Vaping Use: Never used  Substance Use Topics   Alcohol use: No   Drug use: No    Review of Systems  Constitutional: No fever/chills Eyes: No visual changes. ENT: No sore throat. Cardiovascular: Denies chest pain. Respiratory: Denies shortness of breath. Gastrointestinal: No abdominal pain.  No nausea, no vomiting.  No diarrhea.  No constipation. Genitourinary: Negative for dysuria. Musculoskeletal: Negative for back pain.  Positive for right foot pain. Skin: Negative for rash. Neurological: Negative for headaches, focal weakness or numbness.  ____________________________________________   PHYSICAL EXAM:  VITAL SIGNS: ED Triage Vitals [04/03/21 0032]  Enc Vitals Group     BP 130/77     Pulse Rate 76     Resp 16     Temp 98.4 F (36.9 C)     Temp Source Oral     SpO2 100 %     Weight 199 lb (90.3 kg)     Height '5\' 4"'$  (1.626 m)     Head Circumference      Peak Flow      Pain Score 8     Pain Loc      Pain Edu?       Excl. in [place]?     Constitutional: Alert and oriented. Eyes: Conjunctivae are normal. Head: Atraumatic. Nose: No congestion/rhinnorhea. Mouth/Throat: Mucous membranes are moist. Neck: Normal ROM Cardiovascular: Normal rate,  regular rhythm. Grossly normal heart sounds.  2+ DP pulses bilaterally. Respiratory: Normal respiratory effort.  No retractions. Lungs CTAB. Gastrointestinal: Soft and nontender. No distention. Genitourinary: deferred Musculoskeletal: Diffuse tenderness and mild edema over the dorsum of right foot.  No right ankle tenderness to palpation. Neurologic:  Normal speech and language. No gross focal neurologic deficits are appreciated. Skin:  Skin is warm, dry and intact. No rash noted. Psychiatric: Mood and affect are normal. Speech and behavior are normal.  ____________________________________________   LABS (all labs ordered are listed, but only abnormal results are displayed)  Labs Reviewed  CBG MONITORING, ED - Abnormal; Notable for the following components:      Result Value   Glucose-Capillary 134 (*)    All other components within normal limits  CBG MONITORING, ED    PROCEDURES  Procedure(s) performed (including Critical Care):  Procedures   ____________________________________________   INITIAL IMPRESSION / ASSESSMENT AND PLAN / ED COURSE      80 year old female with past medical history of hypertension, hyperlipidemia, diabetes, CKD, and paroxysmal atrial fibrillation on Eliquis who presents to the ED complaining of pain and swelling to the dorsum of her right foot after she dropped a weight on it.  X-rays of right foot reviewed by me and show no fracture or dislocation.  Patient with apparent contusion to the top of her foot and we will give dose of pain medication here in the ED, prescribe Lidoderm patches for use at home.  Patient is appropriate for discharge home and was counseled to return to the ED for new or worsening symptoms, patient agrees  with plan.      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    Blake Divine, MD 04/03/21 [phone]

## 2021-04-03 NOTE — ED Triage Notes (Signed)
Pt dropped something heavy on right foot around 1600 yesterday. Pt with swelling noted, states is not able to weight bear.

## 2021-04-03 NOTE — ED Notes (Signed)
Patient verbalizes understanding of discharge instructions. Opportunity for questioning and answers were provided. Armband removed by staff, pt discharged from ED. Wheeled out to lobby, assisted to car

## 2021-04-08 DIAGNOSIS — M79671 Pain in right foot: Secondary | ICD-10-CM | POA: Diagnosis not present

## 2021-04-08 DIAGNOSIS — Z6837 Body mass index (BMI) 37.0-37.9, adult: Secondary | ICD-10-CM | POA: Diagnosis not present

## 2021-04-08 DIAGNOSIS — Z794 Long term (current) use of insulin: Secondary | ICD-10-CM | POA: Diagnosis not present

## 2021-04-08 DIAGNOSIS — E119 Type 2 diabetes mellitus without complications: Secondary | ICD-10-CM | POA: Diagnosis not present

## 2021-04-21 DIAGNOSIS — E1122 Type 2 diabetes mellitus with diabetic chronic kidney disease: Secondary | ICD-10-CM | POA: Diagnosis not present

## 2021-04-21 DIAGNOSIS — I480 Paroxysmal atrial fibrillation: Secondary | ICD-10-CM | POA: Diagnosis not present

## 2021-04-21 DIAGNOSIS — E782 Mixed hyperlipidemia: Secondary | ICD-10-CM | POA: Diagnosis not present

## 2021-04-21 DIAGNOSIS — N183 Chronic kidney disease, stage 3 unspecified: Secondary | ICD-10-CM | POA: Diagnosis not present

## 2021-05-03 DIAGNOSIS — J8410 Pulmonary fibrosis, unspecified: Secondary | ICD-10-CM | POA: Diagnosis not present

## 2021-05-05 DIAGNOSIS — Z0000 Encounter for general adult medical examination without abnormal findings: Secondary | ICD-10-CM | POA: Diagnosis not present

## 2021-05-05 DIAGNOSIS — I779 Disorder of arteries and arterioles, unspecified: Secondary | ICD-10-CM | POA: Diagnosis not present

## 2021-05-05 DIAGNOSIS — E1122 Type 2 diabetes mellitus with diabetic chronic kidney disease: Secondary | ICD-10-CM | POA: Diagnosis not present

## 2021-05-05 DIAGNOSIS — I480 Paroxysmal atrial fibrillation: Secondary | ICD-10-CM | POA: Diagnosis not present

## 2021-05-18 DIAGNOSIS — D225 Melanocytic nevi of trunk: Secondary | ICD-10-CM | POA: Diagnosis not present

## 2021-05-18 DIAGNOSIS — Z85828 Personal history of other malignant neoplasm of skin: Secondary | ICD-10-CM | POA: Diagnosis not present

## 2021-05-18 DIAGNOSIS — D2261 Melanocytic nevi of right upper limb, including shoulder: Secondary | ICD-10-CM | POA: Diagnosis not present

## 2021-05-18 DIAGNOSIS — D2262 Melanocytic nevi of left upper limb, including shoulder: Secondary | ICD-10-CM | POA: Diagnosis not present

## 2021-05-18 DIAGNOSIS — C44319 Basal cell carcinoma of skin of other parts of face: Secondary | ICD-10-CM | POA: Diagnosis not present

## 2021-05-19 ENCOUNTER — Other Ambulatory Visit: Payer: Self-pay

## 2021-05-19 ENCOUNTER — Encounter: Payer: Self-pay | Admitting: Gastroenterology

## 2021-05-19 ENCOUNTER — Other Ambulatory Visit: Payer: Self-pay | Admitting: Gastroenterology

## 2021-05-19 ENCOUNTER — Ambulatory Visit (INDEPENDENT_AMBULATORY_CARE_PROVIDER_SITE_OTHER): Payer: Medicare Other | Admitting: Gastroenterology

## 2021-05-19 VITALS — BP 120/73 | HR 70 | Temp 98.9°F | Ht 64.0 in

## 2021-05-19 DIAGNOSIS — B351 Tinea unguium: Secondary | ICD-10-CM | POA: Diagnosis not present

## 2021-05-19 DIAGNOSIS — R12 Heartburn: Secondary | ICD-10-CM

## 2021-05-19 DIAGNOSIS — R1110 Vomiting, unspecified: Secondary | ICD-10-CM

## 2021-05-19 DIAGNOSIS — E1142 Type 2 diabetes mellitus with diabetic polyneuropathy: Secondary | ICD-10-CM | POA: Diagnosis not present

## 2021-05-19 DIAGNOSIS — L851 Acquired keratosis [keratoderma] palmaris et plantaris: Secondary | ICD-10-CM | POA: Diagnosis not present

## 2021-05-19 NOTE — Addendum Note (Signed)
Addended by: Wayna Chalet on: 05/19/2021 04:32 PM   Modules accepted: Orders

## 2021-05-19 NOTE — Addendum Note (Signed)
Addended by: Wayna Chalet on: 05/19/2021 04:13 PM   Modules accepted: Orders

## 2021-05-19 NOTE — Addendum Note (Signed)
Addended by: Wayna Chalet on: 05/19/2021 03:48 PM   Modules accepted: Orders

## 2021-05-19 NOTE — Progress Notes (Signed)
Jonathon Bellows MD, MRCP(U.K) [address]  [place]  [place], [place] [address]  Main: [phone]  Fax: [phone]   Primary Care Physician: [name], MD  Primary Gastroenterologist:  Dr. Jonathon Bellows   C/c : Follow up gastric polyp   HPI: Deborah Obrien is a 80 y.o. female  Summary of history :   Deborah Obrien was  admitted in mid 2019  with a severe upper GI bleed.  I performed an upper endoscopy and noted a large gastric polyp that was bleeding.  At that point of time I sprayed hemospray and obtained hemostasis.  She was on Eliquis for atrial fibrillation and she has had a history of strokes.  She underwent an USG RUQ which showed a mildly dilated CBD 9 mm, she underwent an EUS at [facility] after and found no stones in her CBD and was discharged. 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  Ct abdomen 06/14/18 showed no acute findings - atrophic pancreatitis. Features of prior kyphoplasty at t12- L1   EGD 07/09/18 : EGD: 4 large gastric polyps were resected. All were hyperplastic , few other smaller polyps seen but not resected.     05/17/2019: EGD: Notable small sessile polyps resected.  A 15 mm sessile polyp was also resected by EMR.  All polyps were gastric hyperplastic polyps with surface elevation and inflammation were negative for malignancy.   06/18/2019: Hemoglobin 12.1.   10/21/2019 hemoglobin 11.9 g with an MCV of 97. 10/21/2019: BUN of 36 and creatinine of 1.7 12/29/2019: Hemoglobin 11.7 g.      Interval history 08/24/2020-05/19/2021   02/03/2021: Hb 13.1 grams.   She is doing generally okay some episodes of acid reflux.  She is not on a PPI presently but taking famotidine twice a day.  She is having some episodes of regurgitation but no clear dysphagia.  Denies any blood in the stool or melena.        Current Outpatient Medications  Medication Sig Dispense Refill   allopurinol (ZYLOPRIM) 100 MG tablet Take 150 mg by  mouth daily.      amiodarone (PACERONE) 200 MG tablet Take 1 tablet (200 mg total) by mouth daily. 90 tablet 3   apixaban (ELIQUIS) 2.5 MG TABS tablet Take 1 tablet (2.5 mg total) by mouth 2 (two) times daily. 180 tablet 1   busPIRone (BUSPAR) 10 MG tablet Take 10 mg by mouth 2 (two) times daily.      cholecalciferol (VITAMIN D3) 10 MCG (400 UNIT) TABS tablet Take 400 Units by mouth in the morning and at bedtime.     ciprofloxacin (CIPRO) 250 MG tablet Take 250 mg by mouth 3 (three) times a week.     colchicine 0.6 MG tablet Take 0.6 mg by mouth daily as needed (gout).      docusate sodium (COLACE) 100 MG capsule Take 200 mg by mouth 2 (two) times daily.     DULoxetine (CYMBALTA) 30 MG capsule Take 30 mg by mouth daily.      furosemide (LASIX) 20 MG tablet Take 20 mg by mouth every other day. In the morning     gabapentin (NEURONTIN) 600 MG tablet Take 1,200 mg by mouth 3 (three) times daily.      HYDROcodone-acetaminophen (NORCO) 10-325 MG tablet Take 1 tablet by mouth every 4 (four) hours as needed for moderate pain. (Patient taking differently: Take 1 tablet by mouth every 6 (six)  hours as needed for moderate pain.) 50 tablet 0   insulin NPH-regular Human (NOVOLIN 70/30) (70-30) 100 UNIT/ML injection Inject 25 Units into the skin 2 (two) times daily before a meal. Before breakfast & before supper     ketoconazole (NIZORAL) 2 % cream Apply 1 application topically daily as needed (rash/skin irritation.).      levothyroxine (SYNTHROID) 50 MCG tablet Take 50 mcg by mouth daily before breakfast.     lidocaine (LIDODERM) 5 % Place 1 patch onto the skin every 12 (twelve) hours. Remove & Discard patch within 12 hours or as directed by MD 10 patch 0   oxybutynin (DITROPAN) 5 MG tablet Take 10 mg by mouth 2 (two) times daily. Breakfast & bedtime     pantoprazole (PROTONIX) 40 MG tablet Take 1 tablet by mouth once daily (Patient taking differently: Take 40 mg by mouth daily with breakfast.) 90 tablet 1    Polyethyl Glycol-Propyl Glycol 0.4-0.3 % SOLN Place 1-2 drops into both eyes 4 (four) times daily as needed (dry eyes).      pravastatin (PRAVACHOL) 20 MG tablet Take 20 mg by mouth at bedtime.   2   salmeterol (SEREVENT DISKUS) 50 MCG/DOSE diskus inhaler Inhale into the lungs.     talc powder Apply 1 application topically as needed (under the breast irritation).     traMADol (ULTRAM) 50 MG tablet Take 50 mg by mouth every 6 (six) hours as needed.     traZODone (DESYREL) 50 MG tablet Take 50 mg by mouth at bedtime.      vitamin B-12 (CYANOCOBALAMIN) 1000 MCG tablet Take 1,000 mcg by mouth daily.     vitamin C (ASCORBIC ACID) 500 MG tablet Take 500 mg by mouth 2 (two) times daily.     No current facility-administered medications for this visit.    Allergies as of 05/19/2021 - Review Complete 05/19/2021  Allergen Reaction Noted   Fish allergy Shortness Of Breath 11/26/2015   Macrolides and ketolides Other (See Comments) 01/12/2015   Meperidine Shortness Of Breath, Nausea Only, and Other (See Comments) 01/19/2015   Other Nausea Only, Swelling, Rash, Anaphylaxis, Diarrhea, Shortness Of Breath, and Other (See Comments) 01/12/2015   Prednisone Anaphylaxis and Other (See Comments) 01/19/2015   Shellfish allergy Anaphylaxis, Shortness Of Breath, Diarrhea, Nausea Only, and Rash 06/29/2015   Sulfa antibiotics Shortness Of Breath, Diarrhea, Nausea Only, and Other (See Comments) 06/25/2013   Sulfacetamide sodium Diarrhea, Nausea Only, Other (See Comments), and Shortness Of Breath 08/12/2015   Telbivudine Other (See Comments) 08/12/2015   Uloric [febuxostat] Anaphylaxis 08/22/2016   Aspirin Rash and Other (See Comments) 04/07/2014   Celecoxib Other (See Comments) 01/19/2015   Cephalexin Diarrhea, Nausea Only, and Other (See Comments) 01/19/2015   Codeine Diarrhea, Nausea Only, and Other (See Comments) 01/19/2015   Dilaudid [hydromorphone hcl] Other (See Comments) 07/03/2018   Erythromycin Diarrhea,  Nausea Only, and Other (See Comments) 01/19/2015   Iodinated diagnostic agents Rash and Other (See Comments) 01/19/2015   Lipitor [atorvastatin calcium] Nausea Only 07/03/2018   Oxycodone Other (See Comments) 01/19/2015   Aleve [naproxen]  07/03/2018   Atorvastatin Nausea Only and Other (See Comments) 01/19/2015   Cephalosporins Other (See Comments) 01/19/2015   Doxycycline  08/24/2017   Iodine Rash 06/29/2015   Motrin [ibuprofen]  07/03/2018   Tape Other (See Comments) 06/29/2015   Valdecoxib Swelling and Other (See Comments) 01/19/2015    ROS:  General: Negative for anorexia, weight loss, fever, chills, fatigue, weakness. ENT: Negative for hoarseness, difficulty  swallowing , nasal congestion. CV: Negative for chest pain, angina, palpitations, dyspnea on exertion, peripheral edema.  Respiratory: Negative for dyspnea at rest, dyspnea on exertion, cough, sputum, wheezing.  GI: See history of present illness. GU:  Negative for dysuria, hematuria, urinary incontinence, urinary frequency, nocturnal urination.  Endo: Negative for unusual weight change.    Physical Examination:   BP 120/73 (BP Location: Left Arm, Patient Position: Sitting, Cuff Size: Large)   Pulse 70   Temp 98.9 F (37.2 C) (Oral)   Ht '5\' 4"'$  (1.626 m)   BMI 34.16 kg/m   General: Well-nourished, well-developed in no acute distress.  Eyes: No icterus. Conjunctivae pink. Neuro: Alert and oriented x 3.  Grossly intact. Skin: Warm and dry, no jaundice.   Psych: Alert and cooperative, normal mood and affect.   Imaging Studies: No results found.  Assessment and Plan:   Deborah Obrien is a 80 y.o. y/o female here to follow-up for gastric polyps which caused severe GI bleed in the past requiring resection.  On Eliquis s/p EGD and resection of multiple gastric polyps.  Every time we perform an endoscopy she has severe back pain and chest wall pain.  She does have interstitial lung disease and has a very difficult  time laying down for the procedure with significant pain afterwards.  Explained that any endoscopy procedure we need to discuss the risks versus benefits and in her case with her hemoglobin being stable I do not see a strong reason to perform an endoscopy evaluation.  She has some symptoms of regurgitation and possible acid reflux for which I will perform a barium swallow to look for any luminal obstruction and commence her on Prilosec in addition to Pepcid to treat.  Obviously if the barium shows any gross abnormality we will consider endoscopy evaluation.  Her hemoglobin is 13 g and she is doing well.       Dr Jonathon Bellows  MD,MRCP [facility]) Follow up in 6 weeks telephone visit

## 2021-05-24 ENCOUNTER — Telehealth: Payer: Self-pay

## 2021-05-24 ENCOUNTER — Other Ambulatory Visit: Payer: Self-pay

## 2021-05-24 ENCOUNTER — Ambulatory Visit: Payer: Medicare Other

## 2021-05-24 DIAGNOSIS — R0602 Shortness of breath: Secondary | ICD-10-CM | POA: Diagnosis not present

## 2021-05-24 DIAGNOSIS — R1110 Vomiting, unspecified: Secondary | ICD-10-CM

## 2021-05-24 DIAGNOSIS — Z01818 Encounter for other preprocedural examination: Secondary | ICD-10-CM | POA: Diagnosis not present

## 2021-05-24 NOTE — Telephone Encounter (Signed)
Called patient to her home phone and cell phone and was not able to leave a voicemail. I will try to call her back to let her know that her barium swallow was changed to another date.

## 2021-05-24 NOTE — Telephone Encounter (Signed)
Patient called back and I informed her of her new date for her barium swallow appointment. Patient understood and had no further questions.

## 2021-05-25 ENCOUNTER — Ambulatory Visit (INDEPENDENT_AMBULATORY_CARE_PROVIDER_SITE_OTHER): Payer: Medicare Other

## 2021-05-25 DIAGNOSIS — I495 Sick sinus syndrome: Secondary | ICD-10-CM

## 2021-05-25 LAB — CUP PACEART REMOTE DEVICE CHECK
Battery Remaining Longevity: 111 mo
Battery Voltage: 2.93 V
Brady Statistic AP VP Percent: 0.04 %
Brady Statistic AP VS Percent: 95.22 %
Brady Statistic AS VP Percent: 0 %
Brady Statistic AS VS Percent: 4.74 %
Brady Statistic RA Percent Paced: 95.27 %
Brady Statistic RV Percent Paced: 0.04 %
Date Time Interrogation Session: 20220913012508
Implantable Lead Implant Date: 20161019
Implantable Lead Implant Date: 20181217
Implantable Lead Location: 753859
Implantable Lead Location: 753860
Implantable Lead Model: 5076
Implantable Lead Model: 5076
Implantable Pulse Generator Implant Date: 20181217
Lead Channel Impedance Value: 304 Ohm
Lead Channel Impedance Value: 342 Ohm
Lead Channel Impedance Value: 418 Ohm
Lead Channel Impedance Value: 418 Ohm
Lead Channel Pacing Threshold Amplitude: 0.625 V
Lead Channel Pacing Threshold Amplitude: 1 V
Lead Channel Pacing Threshold Pulse Width: 0.4 ms
Lead Channel Pacing Threshold Pulse Width: 0.4 ms
Lead Channel Sensing Intrinsic Amplitude: 4 mV
Lead Channel Sensing Intrinsic Amplitude: 4 mV
Lead Channel Sensing Intrinsic Amplitude: 5.125 mV
Lead Channel Sensing Intrinsic Amplitude: 5.125 mV
Lead Channel Setting Pacing Amplitude: 2 V
Lead Channel Setting Pacing Amplitude: 2.5 V
Lead Channel Setting Pacing Pulse Width: 0.4 ms
Lead Channel Setting Sensing Sensitivity: 1.2 mV

## 2021-05-26 ENCOUNTER — Telehealth: Payer: Self-pay | Admitting: Gastroenterology

## 2021-05-26 NOTE — Telephone Encounter (Signed)
Pt. Calling about the contrast that she has to take for her upcoming xray or ultrasound she says that she is allergic to contrast. She is requesting a call back

## 2021-05-27 ENCOUNTER — Telehealth: Payer: Self-pay | Admitting: Gastroenterology

## 2021-05-27 NOTE — Telephone Encounter (Signed)
Please read message from 05/27/2021.

## 2021-05-27 NOTE — Telephone Encounter (Signed)
Pt calling says it is urgent. She would like to know what type of contrast is going to be use don her for her upcoming procedure. She said she is allergic.

## 2021-05-27 NOTE — Telephone Encounter (Signed)
Called patient back after calling radiology department and told her that the radiologist stated that she was fine doing the barium swallow since it was barium and not iodine contrast. Patient stated that now it rang a bell and had no further questions.

## 2021-05-31 ENCOUNTER — Ambulatory Visit
Admission: RE | Admit: 2021-05-31 | Discharge: 2021-05-31 | Disposition: A | Discharge status: Home / Self Care | Payer: Medicare Other | Source: Ambulatory Visit | Attending: Gastroenterology | Admitting: Gastroenterology

## 2021-05-31 ENCOUNTER — Ambulatory Visit
Admission: RE | Admit: 2021-05-31 | Discharge: 2021-05-31 | Disposition: A | Discharge status: Home / Self Care | Payer: Medicare Other | Source: Home / Self Care | Attending: Gastroenterology | Admitting: Gastroenterology

## 2021-05-31 DIAGNOSIS — K219 Gastro-esophageal reflux disease without esophagitis: Secondary | ICD-10-CM | POA: Diagnosis not present

## 2021-05-31 DIAGNOSIS — R1110 Vomiting, unspecified: Secondary | ICD-10-CM | POA: Diagnosis not present

## 2021-05-31 NOTE — Progress Notes (Signed)
Inform significant reflux: would suggest we put her back on Prilosec 20 mg Bid to see how she does. Ask her to inform me in 2 weeks how she is feeling

## 2021-06-02 ENCOUNTER — Telehealth: Payer: Self-pay | Admitting: Gastroenterology

## 2021-06-02 MED ORDER — OMEPRAZOLE 20 MG PO CPDR: 20.0000 mg | DELAYED_RELEASE_CAPSULE | Freq: Two times a day (BID) | ORAL | 0 refills | Status: DC

## 2021-06-02 NOTE — Progress Notes (Signed)
Remote pacemaker transmission.   

## 2021-06-02 NOTE — Telephone Encounter (Signed)
Called patient back with the following recommendation from Dr. Vicente Males: Jonathon Bellows, MD  Wayna Chalet, CMA Inform significant reflux: would suggest we put her back on Prilosec 20 mg Bid to see how she does. Ask her to inform me in 2 weeks how she is feeling . Patient agreed and stated that she will definitely call us back in 2 weeks to let us know how she does. Prescription will be sent.

## 2021-06-10 DIAGNOSIS — C44319 Basal cell carcinoma of skin of other parts of face: Secondary | ICD-10-CM | POA: Diagnosis not present

## 2021-06-17 ENCOUNTER — Telehealth: Payer: Self-pay | Admitting: Gastroenterology

## 2021-06-17 NOTE — Telephone Encounter (Signed)
Pt.says medication is working

## 2021-06-18 ENCOUNTER — Other Ambulatory Visit: Payer: Self-pay | Admitting: Gastroenterology

## 2021-06-23 DIAGNOSIS — N1832 Chronic kidney disease, stage 3b: Secondary | ICD-10-CM | POA: Diagnosis not present

## 2021-06-24 DIAGNOSIS — Z23 Encounter for immunization: Secondary | ICD-10-CM | POA: Diagnosis not present

## 2021-07-02 ENCOUNTER — Other Ambulatory Visit: Payer: Self-pay | Admitting: Gastroenterology

## 2021-07-06 ENCOUNTER — Encounter: Payer: Medicare Other | Admitting: Internal Medicine

## 2021-07-14 ENCOUNTER — Ambulatory Visit: Payer: Medicare Other | Admitting: Gastroenterology

## 2021-07-14 ENCOUNTER — Other Ambulatory Visit: Payer: Self-pay

## 2021-07-14 ENCOUNTER — Encounter: Payer: Self-pay | Admitting: Gastroenterology

## 2021-07-14 VITALS — BP 107/49 | HR 69 | Temp 98.1°F | Ht 64.0 in

## 2021-07-14 DIAGNOSIS — D508 Other iron deficiency anemias: Secondary | ICD-10-CM

## 2021-07-14 DIAGNOSIS — N1831 Chronic kidney disease, stage 3a: Secondary | ICD-10-CM | POA: Diagnosis not present

## 2021-07-14 DIAGNOSIS — R1110 Vomiting, unspecified: Secondary | ICD-10-CM

## 2021-07-14 DIAGNOSIS — D631 Anemia in chronic kidney disease: Secondary | ICD-10-CM | POA: Diagnosis not present

## 2021-07-14 NOTE — Progress Notes (Signed)
Jonathon Bellows MD, MRCP(U.K) [address]  [place]  [place], [place] [address]  Main: [phone]  Fax: [phone]   Primary Care Physician: [name], MD  Primary Gastroenterologist:  Dr. Jonathon Bellows   Chief complaint: Follow-up for regurgitation   HPI: Deborah Obrien is a 80 y.o. female  Summary of history :   Deborah Obrien was  admitted in mid 2019  with a severe upper GI bleed.  I performed an upper endoscopy and noted a large gastric polyp that was bleeding.  At that point of time I sprayed hemospray and obtained hemostasis.  She was on Eliquis for atrial fibrillation and she has had a history of strokes.  She underwent an USG RUQ which showed a mildly dilated CBD 9 mm, she underwent an EUS at [facility] after and found no stones in her CBD and was discharged. 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  Ct abdomen 06/14/18 showed no acute findings - atrophic pancreatitis. Features of prior kyphoplasty at t12- L1   EGD 07/09/18 : EGD: 4 large gastric polyps were resected. All were hyperplastic , few other smaller polyps seen but not resected.     05/17/2019: EGD: Notable small sessile polyps resected.  A 15 mm sessile polyp was also resected by EMR.  All polyps were gastric hyperplastic polyps with surface elevation and inflammation were negative for malignancy.   02/03/2021: Hb 13.1 grams.     Interval history 05/19/2021-07/14/2021  06/23/2021 ferritin 9 hemoglobin 10.2 g   At her last visit she was having symptoms of regurgitation and possible acid reflux for which I recommended a barium swallow and commenced on Prilosec in addition to Pepcid to treat.  Barium swallow on 05/31/2021 showed moderate esophageal dysmotility esophageal reflux almost the level of the thoracic inlet no stricture noted.  The pill got stuck at the GE junction for at least 5 minutes.  Since her last visit she has been taking her Prilosec and Pepcid at night.   Absolutely has no symptoms.  Doing very well.  She says she is getting [facility].     Current Outpatient Medications  Medication Sig Dispense Refill   allopurinol (ZYLOPRIM) 100 MG tablet Take 150 mg by mouth daily.      amiodarone (PACERONE) 200 MG tablet Take 1 tablet (200 mg total) by mouth daily. 90 tablet 3   apixaban (ELIQUIS) 2.5 MG TABS tablet Take 1 tablet (2.5 mg total) by mouth 2 (two) times daily. 180 tablet 1   busPIRone (BUSPAR) 10 MG tablet Take 10 mg by mouth 2 (two) times daily.      cholecalciferol (VITAMIN D3) 10 MCG (400 UNIT) TABS tablet Take 400 Units by mouth in the morning and at bedtime.     ciprofloxacin (CIPRO) 250 MG tablet Take 250 mg by mouth 3 (three) times a week.     colchicine 0.6 MG tablet Take 0.6 mg by mouth daily as needed (gout).      docusate sodium (COLACE) 100 MG capsule Take 200 mg by mouth 2 (two) times daily.     DULoxetine (CYMBALTA) 30 MG capsule Take 30 mg by mouth daily.      furosemide (LASIX) 20 MG tablet Take 20 mg by mouth every other day. In the morning     gabapentin (NEURONTIN) 600 MG tablet Take 1,200 mg by mouth 3 (three) times daily.      HYDROcodone-acetaminophen (  NORCO) 10-325 MG tablet Take 1 tablet by mouth every 4 (four) hours as needed for moderate pain. (Patient taking differently: Take 1 tablet by mouth every 6 (six) hours as needed for moderate pain.) 50 tablet 0   insulin NPH-regular Human (NOVOLIN 70/30) (70-30) 100 UNIT/ML injection Inject 25 Units into the skin 2 (two) times daily before a meal. Before breakfast & before supper     ketoconazole (NIZORAL) 2 % cream Apply 1 application topically daily as needed (rash/skin irritation.).      levothyroxine (SYNTHROID) 50 MCG tablet Take 50 mcg by mouth daily before breakfast.     lidocaine (LIDODERM) 5 % Place 1 patch onto the skin every 12 (twelve) hours. Remove & Discard patch within 12 hours or as directed by MD 10 patch 0   omeprazole (PRILOSEC) 20 MG capsule TAKE 1  CAPSULE BY MOUTH TWICE DAILY BEFORE A MEAL 60 capsule 0   oxybutynin (DITROPAN) 5 MG tablet Take 10 mg by mouth 2 (two) times daily. Breakfast & bedtime     pantoprazole (PROTONIX) 40 MG tablet Take 1 tablet by mouth once daily (Patient taking differently: Take 40 mg by mouth daily with breakfast.) 90 tablet 1   Polyethyl Glycol-Propyl Glycol 0.4-0.3 % SOLN Place 1-2 drops into both eyes 4 (four) times daily as needed (dry eyes).      pravastatin (PRAVACHOL) 20 MG tablet Take 20 mg by mouth at bedtime.   2   Prenat-FeCbn-FeBisg-FA-Omega (MULTIVITAMIN/MINERALS PO) Take 1 tablet by mouth daily.     salmeterol (SEREVENT DISKUS) 50 MCG/DOSE diskus inhaler Inhale into the lungs.     talc powder Apply 1 application topically as needed (under the breast irritation).     traMADol (ULTRAM) 50 MG tablet Take 50 mg by mouth every 6 (six) hours as needed.     traZODone (DESYREL) 50 MG tablet Take 50 mg by mouth at bedtime.      vitamin B-12 (CYANOCOBALAMIN) 1000 MCG tablet Take 1,000 mcg by mouth daily.     vitamin C (ASCORBIC ACID) 500 MG tablet Take 500 mg by mouth 2 (two) times daily.     No current facility-administered medications for this visit.    Allergies as of 07/14/2021 - Review Complete 07/14/2021  Allergen Reaction Noted   Fish allergy Shortness Of Breath 11/26/2015   Macrolides and ketolides Other (See Comments) 01/12/2015   Meperidine Shortness Of Breath, Nausea Only, and Other (See Comments) 01/19/2015   Other Nausea Only, Swelling, Rash, Anaphylaxis, Diarrhea, Shortness Of Breath, and Other (See Comments) 01/12/2015   Prednisone Anaphylaxis and Other (See Comments) 01/19/2015   Shellfish allergy Anaphylaxis, Shortness Of Breath, Diarrhea, Nausea Only, and Rash 06/29/2015   Sulfa antibiotics Shortness Of Breath, Diarrhea, Nausea Only, and Other (See Comments) 06/25/2013   Sulfacetamide sodium Diarrhea, Nausea Only, Other (See Comments), and Shortness Of Breath 08/12/2015   Telbivudine  Other (See Comments) 08/12/2015   Uloric [febuxostat] Anaphylaxis 08/22/2016   Aspirin Rash and Other (See Comments) 04/07/2014   Celecoxib Other (See Comments) 01/19/2015   Cephalexin Diarrhea, Nausea Only, and Other (See Comments) 01/19/2015   Codeine Diarrhea, Nausea Only, and Other (See Comments) 01/19/2015   Dilaudid [hydromorphone hcl] Other (See Comments) 07/03/2018   Erythromycin Diarrhea, Nausea Only, and Other (See Comments) 01/19/2015   Iodinated diagnostic agents Rash and Other (See Comments) 01/19/2015   Lipitor [atorvastatin calcium] Nausea Only 07/03/2018   Oxycodone Other (See Comments) 01/19/2015   Aleve [naproxen]  07/03/2018   Atorvastatin Nausea Only and Other (  See Comments) 01/19/2015   Cephalosporins Other (See Comments) 01/19/2015   Doxycycline  08/24/2017   Iodine Rash 06/29/2015   Motrin [ibuprofen]  07/03/2018   Tape Other (See Comments) 06/29/2015   Valdecoxib Swelling and Other (See Comments) 01/19/2015    ROS:  General: Negative for anorexia, weight loss, fever, chills, fatigue, weakness. ENT: Negative for hoarseness, difficulty swallowing , nasal congestion. CV: Negative for chest pain, angina, palpitations, dyspnea on exertion, peripheral edema.  Respiratory: Negative for dyspnea at rest, dyspnea on exertion, cough, sputum, wheezing.  GI: See history of present illness. GU:  Negative for dysuria, hematuria, urinary incontinence, urinary frequency, nocturnal urination.  Endo: Negative for unusual weight change.    Physical Examination:   BP (!) 107/49   Pulse 69   Temp 98.1 F (36.7 C) (Oral)   Ht 5\' 4"  (1.626 m)   BMI 34.16 kg/m   General: Well-nourished, well-developed in no acute distress.  Eyes: No icterus. Conjunctivae pink. Neuro: Alert and oriented x 3.  Grossly intact. Skin: Warm and dry, no jaundice.   Psych: Alert and cooperative, normal mood and affect.   Imaging Studies: No results found.  Assessment and Plan:   Deborah Obrien is a 80 y.o. y/o female here to follow-up for gastric polyps which caused severe GI bleed in the past requiring resection.  On Eliquis s/p EGD and resection of multiple gastric polyps.  Every time we perform an endoscopy she has severe back pain and chest wall pain.  She does have interstitial lung disease and has a very difficult time laying down for the procedure with significant pain afterwards.  Barium swallow showed moderate esophageal dysmotility but no strictures.  There was significant regurgitation/reflux.  Presently also has severe iron deficiency with anemia for which she is getting some IV iron..  Likely due to the effects of Eliquis along with gastric polyps.  I have explained to her that if her levels do not return back to normal and she gets more anemic she would require an endoscopy to check if the polyps in the stomach are bleeding.  Plan 1.  Continue PPI and Pepcid    Dr Jonathon Bellows  MD,MRCP [facility]) Follow up in as needed

## 2021-07-24 ENCOUNTER — Other Ambulatory Visit: Payer: Self-pay | Admitting: Gastroenterology

## 2021-08-02 ENCOUNTER — Other Ambulatory Visit: Payer: Self-pay | Admitting: Internal Medicine

## 2021-08-02 DIAGNOSIS — I480 Paroxysmal atrial fibrillation: Secondary | ICD-10-CM

## 2021-08-03 NOTE — Telephone Encounter (Signed)
This is a [place] pt 

## 2021-08-16 DIAGNOSIS — R399 Unspecified symptoms and signs involving the genitourinary system: Secondary | ICD-10-CM | POA: Diagnosis not present

## 2021-08-16 DIAGNOSIS — R8290 Unspecified abnormal findings in urine: Secondary | ICD-10-CM | POA: Diagnosis not present

## 2021-08-24 ENCOUNTER — Ambulatory Visit (INDEPENDENT_AMBULATORY_CARE_PROVIDER_SITE_OTHER): Payer: Medicare Other

## 2021-08-24 DIAGNOSIS — I495 Sick sinus syndrome: Secondary | ICD-10-CM | POA: Diagnosis not present

## 2021-08-25 LAB — CUP PACEART REMOTE DEVICE CHECK
Battery Remaining Longevity: 107 mo
Battery Voltage: 2.92 V
Brady Statistic AP VP Percent: 0.04 %
Brady Statistic AP VS Percent: 96.89 %
Brady Statistic AS VP Percent: 0 %
Brady Statistic AS VS Percent: 3.07 %
Brady Statistic RA Percent Paced: 96.94 %
Brady Statistic RV Percent Paced: 0.04 %
Date Time Interrogation Session: 20221214131116
Implantable Lead Implant Date: 20161019
Implantable Lead Implant Date: 20181217
Implantable Lead Location: 753859
Implantable Lead Location: 753860
Implantable Lead Model: 5076
Implantable Lead Model: 5076
Implantable Pulse Generator Implant Date: 20181217
Lead Channel Impedance Value: 323 Ohm
Lead Channel Impedance Value: 380 Ohm
Lead Channel Impedance Value: 418 Ohm
Lead Channel Impedance Value: 418 Ohm
Lead Channel Pacing Threshold Amplitude: 0.75 V
Lead Channel Pacing Threshold Amplitude: 1 V
Lead Channel Pacing Threshold Pulse Width: 0.4 ms
Lead Channel Pacing Threshold Pulse Width: 0.4 ms
Lead Channel Sensing Intrinsic Amplitude: 5.375 mV
Lead Channel Sensing Intrinsic Amplitude: 5.375 mV
Lead Channel Sensing Intrinsic Amplitude: 5.75 mV
Lead Channel Sensing Intrinsic Amplitude: 5.75 mV
Lead Channel Setting Pacing Amplitude: 2 V
Lead Channel Setting Pacing Amplitude: 2.5 V
Lead Channel Setting Pacing Pulse Width: 0.4 ms
Lead Channel Setting Sensing Sensitivity: 1.2 mV

## 2021-09-02 NOTE — Progress Notes (Signed)
Remote pacemaker transmission.   

## 2021-09-07 ENCOUNTER — Encounter: Payer: Medicare Other | Admitting: Internal Medicine

## 2021-09-07 DIAGNOSIS — I2510 Atherosclerotic heart disease of native coronary artery without angina pectoris: Secondary | ICD-10-CM | POA: Diagnosis not present

## 2021-09-07 DIAGNOSIS — E1122 Type 2 diabetes mellitus with diabetic chronic kidney disease: Secondary | ICD-10-CM | POA: Diagnosis not present

## 2021-09-07 DIAGNOSIS — J8410 Pulmonary fibrosis, unspecified: Secondary | ICD-10-CM | POA: Diagnosis not present

## 2021-09-07 DIAGNOSIS — N183 Chronic kidney disease, stage 3 unspecified: Secondary | ICD-10-CM | POA: Diagnosis not present

## 2021-09-07 DIAGNOSIS — I480 Paroxysmal atrial fibrillation: Secondary | ICD-10-CM | POA: Diagnosis not present

## 2021-09-07 DIAGNOSIS — M1A0710 Idiopathic chronic gout, right ankle and foot, without tophus (tophi): Secondary | ICD-10-CM | POA: Diagnosis not present

## 2021-09-14 ENCOUNTER — Encounter: Payer: Medicare Other | Admitting: Internal Medicine

## 2021-09-14 DIAGNOSIS — I779 Disorder of arteries and arterioles, unspecified: Secondary | ICD-10-CM | POA: Diagnosis not present

## 2021-09-14 DIAGNOSIS — I480 Paroxysmal atrial fibrillation: Secondary | ICD-10-CM | POA: Diagnosis not present

## 2021-09-14 DIAGNOSIS — M791 Myalgia, unspecified site: Secondary | ICD-10-CM | POA: Diagnosis not present

## 2021-09-14 DIAGNOSIS — E1122 Type 2 diabetes mellitus with diabetic chronic kidney disease: Secondary | ICD-10-CM | POA: Diagnosis not present

## 2021-09-15 ENCOUNTER — Encounter: Payer: Self-pay | Admitting: Internal Medicine

## 2021-09-23 ENCOUNTER — Telehealth: Payer: Self-pay | Admitting: Internal Medicine
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
## 2021-09-23 NOTE — Telephone Encounter (Signed)
I called and spoke with the patient regarding the status of her husband and is dementia.  Time spent with the patient on the phone was ~ 45 minutes expressing her concerns and their current status with his care and dementia.  The patient was very appreciative for the call back and the opportunity to just talk about her current situation.

## 2021-10-06 ENCOUNTER — Other Ambulatory Visit: Payer: Self-pay | Admitting: Gastroenterology

## 2021-10-24 DIAGNOSIS — G4733 Obstructive sleep apnea (adult) (pediatric): Secondary | ICD-10-CM | POA: Diagnosis not present

## 2021-10-26 DIAGNOSIS — S8012XA Contusion of left lower leg, initial encounter: Secondary | ICD-10-CM | POA: Diagnosis not present

## 2021-10-26 DIAGNOSIS — Z96642 Presence of left artificial hip joint: Secondary | ICD-10-CM | POA: Diagnosis not present

## 2021-10-26 DIAGNOSIS — Z96652 Presence of left artificial knee joint: Secondary | ICD-10-CM | POA: Diagnosis not present

## 2021-10-26 DIAGNOSIS — Z471 Aftercare following joint replacement surgery: Secondary | ICD-10-CM | POA: Diagnosis not present

## 2021-10-26 DIAGNOSIS — I480 Paroxysmal atrial fibrillation: Secondary | ICD-10-CM | POA: Diagnosis not present

## 2021-10-26 DIAGNOSIS — M25562 Pain in left knee: Secondary | ICD-10-CM | POA: Diagnosis not present

## 2021-11-01 ENCOUNTER — Other Ambulatory Visit: Payer: Self-pay | Admitting: Internal Medicine

## 2021-11-01 DIAGNOSIS — Z1231 Encounter for screening mammogram for malignant neoplasm of breast: Secondary | ICD-10-CM

## 2021-11-03 ENCOUNTER — Other Ambulatory Visit: Payer: Self-pay | Admitting: Gastroenterology

## 2021-11-03 ENCOUNTER — Other Ambulatory Visit: Payer: Self-pay | Admitting: Internal Medicine

## 2021-11-03 DIAGNOSIS — I480 Paroxysmal atrial fibrillation: Secondary | ICD-10-CM

## 2021-11-09 DIAGNOSIS — M65332 Trigger finger, left middle finger: Secondary | ICD-10-CM | POA: Diagnosis not present

## 2021-11-10 DIAGNOSIS — J449 Chronic obstructive pulmonary disease, unspecified: Secondary | ICD-10-CM | POA: Diagnosis not present

## 2021-11-10 DIAGNOSIS — I5033 Acute on chronic diastolic (congestive) heart failure: Secondary | ICD-10-CM | POA: Diagnosis not present

## 2021-11-10 DIAGNOSIS — I2510 Atherosclerotic heart disease of native coronary artery without angina pectoris: Secondary | ICD-10-CM | POA: Diagnosis not present

## 2021-11-10 DIAGNOSIS — M6281 Muscle weakness (generalized): Secondary | ICD-10-CM | POA: Diagnosis not present

## 2021-11-17 DIAGNOSIS — E1122 Type 2 diabetes mellitus with diabetic chronic kidney disease: Secondary | ICD-10-CM | POA: Diagnosis not present

## 2021-11-17 DIAGNOSIS — Z794 Long term (current) use of insulin: Secondary | ICD-10-CM | POA: Diagnosis not present

## 2021-11-17 DIAGNOSIS — N183 Chronic kidney disease, stage 3 unspecified: Secondary | ICD-10-CM | POA: Diagnosis not present

## 2021-11-23 ENCOUNTER — Other Ambulatory Visit: Payer: Self-pay

## 2021-11-23 ENCOUNTER — Encounter: Payer: Self-pay | Admitting: Internal Medicine

## 2021-11-23 ENCOUNTER — Ambulatory Visit (INDEPENDENT_AMBULATORY_CARE_PROVIDER_SITE_OTHER): Payer: Medicare Other

## 2021-11-23 ENCOUNTER — Ambulatory Visit (INDEPENDENT_AMBULATORY_CARE_PROVIDER_SITE_OTHER): Payer: Medicare Other | Admitting: Internal Medicine

## 2021-11-23 VITALS — BP 112/60 | HR 61 | Ht 64.0 in | Wt 170.0 lb

## 2021-11-23 DIAGNOSIS — I5032 Chronic diastolic (congestive) heart failure: Secondary | ICD-10-CM | POA: Diagnosis not present

## 2021-11-23 DIAGNOSIS — I480 Paroxysmal atrial fibrillation: Secondary | ICD-10-CM

## 2021-11-23 DIAGNOSIS — Z950 Presence of cardiac pacemaker: Secondary | ICD-10-CM | POA: Diagnosis not present

## 2021-11-23 DIAGNOSIS — I495 Sick sinus syndrome: Secondary | ICD-10-CM

## 2021-11-23 LAB — CUP PACEART REMOTE DEVICE CHECK
Battery Remaining Longevity: 102 mo
Battery Voltage: 2.92 V
Brady Statistic AP VP Percent: 0.04 %
Brady Statistic AP VS Percent: 96.7 %
Brady Statistic AS VP Percent: 0 %
Brady Statistic AS VS Percent: 3.26 %
Brady Statistic RA Percent Paced: 96.75 %
Brady Statistic RV Percent Paced: 0.04 %
Date Time Interrogation Session: 20230314012515
Implantable Lead Implant Date: 20161019
Implantable Lead Implant Date: 20181217
Implantable Lead Location: 753859
Implantable Lead Location: 753860
Implantable Lead Model: 5076
Implantable Lead Model: 5076
Implantable Pulse Generator Implant Date: 20181217
Lead Channel Impedance Value: 304 Ohm
Lead Channel Impedance Value: 361 Ohm
Lead Channel Impedance Value: 380 Ohm
Lead Channel Impedance Value: 418 Ohm
Lead Channel Pacing Threshold Amplitude: 0.75 V
Lead Channel Pacing Threshold Amplitude: 1 V
Lead Channel Pacing Threshold Pulse Width: 0.4 ms
Lead Channel Pacing Threshold Pulse Width: 0.4 ms
Lead Channel Sensing Intrinsic Amplitude: 5 mV
Lead Channel Sensing Intrinsic Amplitude: 5 mV
Lead Channel Sensing Intrinsic Amplitude: 5.125 mV
Lead Channel Sensing Intrinsic Amplitude: 5.125 mV
Lead Channel Setting Pacing Amplitude: 2 V
Lead Channel Setting Pacing Amplitude: 2.5 V
Lead Channel Setting Pacing Pulse Width: 0.4 ms
Lead Channel Setting Sensing Sensitivity: 1.2 mV

## 2021-11-23 NOTE — Patient Instructions (Signed)
Medication Instructions:  ?- Your physician recommends that you continue on your current medications as directed. Please refer to the Current Medication list given to you today. ? ?*If you need a refill on your cardiac medications before your next appointment, please call your pharmacy* ? ? ?Lab Work: ?- none ordered ? ?If you have labs (blood work) drawn today and your tests are completely normal, you will receive your results only by: ?MyChart Message (if you have MyChart) OR ?A paper copy in the mail ?If you have any lab test that is abnormal or we need to change your treatment, we will call you to review the results. ? ? ?Testing/Procedures: ?- none ordered ? ? ?Follow-Up: ?At [facility], you and your health needs are our priority.  As part of our continuing mission to provide you with exceptional heart care, we have created designated Provider Care Teams.  These Care Teams include your primary Cardiologist (physician) and Advanced Practice Providers (APPs -  Physician Assistants and Nurse Practitioners) who all work together to provide you with the care you need, when you need it. ? ?We recommend signing up for the patient portal called "MyChart".  Sign up information is provided on this After Visit Summary.  MyChart is used to connect with patients for Virtual Visits (Telemedicine).  Patients are able to view lab/test results, encounter notes, upcoming appointments, etc.  Non-urgent messages can be sent to your provider as well.   ?To learn more about what you can do with MyChart, go to [email or web address].   ? ?Your next appointment:   ?6 month(s) ? ?The format for your next appointment:   ?In Person ? ?Provider:   ?[name], MD  ? ? ?Other Instructions ?N/a ? ?

## 2021-11-23 NOTE — Progress Notes (Signed)
? ? ? ? ?Patient Care Team: ?[name], MD as PCP - General (Internal Medicine) ?Deboraha Sprang, MD as PCP - Cardiology (Cardiology) ?[name], MD (Internal Medicine) ?Robert Bellow, MD (General Surgery) ? ? ?HPI ? ?Deborah Obrien is a 81 y.o. female ?Seen for sinus node dysfunction exercise intolerance and previously implanted single chamber pacemaker for which she underwent dual chamber upgrade 12/18 cx by encircling of the chronic RV lead by the atrial lead resulting in tension for which she underwent RA lead revision. ? ?She has hx of atrial arrhythmias, with atrial fib and flutter, typical/atypical all documented.  on amiodarone   ? ?Patient denies symptoms of respiratory, GI intolerance, sun sensitivity, neurological symptoms attributable to amiodarone.   ? ?Chronic dyspnea, some edema ? ?Life has been so difficult over the last months w progressive dementia with her husband and him having to move to a care facility  ? ?High resolution CT scan 7/21 >> nonspecific groundglass and reticular densities.  Possibly related to scarring cannot exclude interstitial lung disease.  Unchanged 6/22.  ? ? ? ?   ?Date Test EF   ?11/16  Echo   55-60   ?12/17 Echo   45% Stress neg  ?12/17 Cath  Nonobstructive CAD  Mr 2+  ?2/19 Myoview  55-65% No ischemia  ?7/19 Echo    60-65% Mild RVE  ? ? ?  ?Date Cr K Hgb TSH LFTs  ?10/18 1.72  13.5    ?12/18 1.32  13.5    ?6/19 1.57 5.5>>4.0 13.7 2.95 71(5/19)  ?8/19 1.53 3.8 11.8>>12.7  14  ?10/19   12.2 (11/19)  20  ?1/20 1.94 4.3 11.5 10.67 21  ?5/20 1.6 4.8  4.135 50  ?1/21 1.6<<2.1 4.7 12.1 (10/20) 7.133 33  ?4/21 1.66 4.9 11.7 4.1 (CE) 5/21 18  ?11/21 1.9 4.3 12.2  28 (8/21)  ?12/22 1.5 4.8 10.2 (10/22) ?FeSat 7%  40  ? ? ?Thromboembolic risk factors ( age  -2, HTN-1, TIA/CVA-2 , DM-1, Vasc disease -1, Gender-1) for a CHADSVASc Score of >=8 ? ? ?Past Medical History:  ?Diagnosis Date  ? A-fib ([place]) 2016  ? Acid reflux   ? Anemia   ? Anxiety   ? Bowel  obstruction ([place])   ? Carotid artery disease ([place])   ? CHF (congestive heart failure) ([place]) 2017  ? Chronic kidney disease (CKD) stage G3a/A1, moderately decreased glomerular filtration rate (GFR) between 45-59 mL/min/1.73 square meter and albuminuria creatinine ratio less than 30 mg/g (HCC)   ? stage IV  ? Coronary artery disease   ? Depression   ? Diabetes mellitus without complication ([place])   ? Dyspnea   ? Dysrhythmia   ? PAROXYSMAL ATRIAL FIBRILLATION  ? Fatty liver   ? GI bleed   ? Gout   ? Hypertension   ? Hypothyroidism   ? Low back pain   ? history of kyphoplasty t12-l1  ? Morbid obesity ([place])   ? Multiple gastric polyps   ? history of gi bleed.  polyps removed  ? Neuromuscular disorder ([place])   ? diabetic neuropathy  ? Osteoarthritis   ? Osteoporosis   ? Paroxysmal atrial fibrillation (HCC)   ? Presence of permanent cardiac pacemaker   ? Sinus node dysfunction ([place]) 08/24/2017  ? Stroke [facility])   ? TIA, patient unaware that she had it. no residual  ? TIA (transient ischemic attack)   ? ? ?Past Surgical History:  ?Procedure Laterality Date  ?  ABDOMINAL HYSTERECTOMY  1980  ? APPENDECTOMY    ? BACK SURGERY    ? 2008. plate & screws   ? BACK SURGERY  2019  ? patient describes kyphoplasty for compression fractures, MD referral said fusion  ? BREAST BIOPSY Right 08/16  ? stereo fibroadenomatous change, neg for atypia  ? BREAST EXCISIONAL BIOPSY Left   ? neg  ? CARDIAC CATHETERIZATION Left 08/25/2016  ? Procedure: Left Heart Cath and Coronary Angiography;  Surgeon: Corey Skains, MD;  Location: [place] CV LAB;  Service: Cardiovascular;  Laterality: Left;  ? CARDIOVERSION N/A 06/28/2017  ? Procedure: CARDIOVERSION;  Surgeon: Corey Skains, MD;  Location: [facility] ORS;  Service: Cardiovascular;  Laterality: N/A;  ? CARDIOVERSION N/A 02/06/2018  ? Procedure: CARDIOVERSION;  Surgeon: Josue Hector, MD;  Location: [facility] ENDOSCOPY;  Service: Cardiovascular;  Laterality: N/A;  ? CARDIOVERSION N/A 07/15/2020  ?  Procedure: CARDIOVERSION;  Surgeon: Jerline Pain, MD;  Location: [facility] ENDOSCOPY;  Service: Cardiovascular;  Laterality: N/A;  ? CATARACT EXTRACTION, BILATERAL  2012  ? CHOLECYSTECTOMY    ? colon blockage  1999  ? COLON SURGERY  2000  ? removed 20% of colon. colon had collapsed  ? COLONOSCOPY  2016  ? polyps removed 2016  ? DORSAL COMPARTMENT RELEASE Left 09/14/2016  ? Procedure: RELEASE DORSAL COMPARTMENT (DEQUERVAIN);  Surgeon: Dereck Leep, MD;  Location: [facility] ORS;  Service: Orthopedics;  Laterality: Left;  ? ESOPHAGOGASTRODUODENOSCOPY N/A 01/27/2015  ? Procedure: ESOPHAGOGASTRODUODENOSCOPY (EGD);  Surgeon: Lollie Sails, MD;  Location: [facility] ENDOSCOPY;  Service: Endoscopy;  Laterality: N/A;  ? ESOPHAGOGASTRODUODENOSCOPY (EGD) WITH PROPOFOL N/A 07/24/2015  ? Procedure: ESOPHAGOGASTRODUODENOSCOPY (EGD) WITH PROPOFOL;  Surgeon: Lucilla Lame, MD;  Location: [facility] ENDOSCOPY;  Service: Endoscopy;  Laterality: N/A;  ? ESOPHAGOGASTRODUODENOSCOPY (EGD) WITH PROPOFOL N/A 04/12/2018  ? Procedure: ESOPHAGOGASTRODUODENOSCOPY (EGD) WITH PROPOFOL;  Surgeon: Jonathon Bellows, MD;  Location: [facility] ENDOSCOPY;  Service: Gastroenterology;  Laterality: N/A;  ? ESOPHAGOGASTRODUODENOSCOPY (EGD) WITH PROPOFOL N/A 06/22/2018  ? Procedure: ESOPHAGOGASTRODUODENOSCOPY (EGD) WITH PROPOFOL;  Surgeon: [name], MD;  Location: [facility] ENDOSCOPY;  Service: Endoscopy;  Laterality: N/A;  ? ESOPHAGOGASTRODUODENOSCOPY (EGD) WITH PROPOFOL N/A 07/09/2018  ? Procedure: ESOPHAGOGASTRODUODENOSCOPY (EGD) WITH PROPOFOL with resection of gastric polyps;  Surgeon: Jonathon Bellows, MD;  Location: [facility] ENDOSCOPY;  Service: Gastroenterology;  Laterality: N/A;  ? ESOPHAGOGASTRODUODENOSCOPY (EGD) WITH PROPOFOL N/A 05/17/2019  ? Procedure: ESOPHAGOGASTRODUODENOSCOPY (EGD) WITH PROPOFOL;  Surgeon: Jonathon Bellows, MD;  Location: [facility] ENDOSCOPY;  Service: Gastroenterology;  Laterality: N/A;  ? EUS N/A 06/22/2018  ? Procedure: UPPER ENDOSCOPIC ULTRASOUND (EUS) RADIAL;  Surgeon:  [name], MD;  Location: [facility] ENDOSCOPY;  Service: Endoscopy;  Laterality: N/A;  ? EYE SURGERY Bilateral   ? cataract extractions  ? JOINT REPLACEMENT Bilateral 2014  ? Bilateral Knee replacement  ? LEAD REVISION/REPAIR N/A 08/29/2017  ? Procedure: LEAD REVISION/REPAIR;  Surgeon: Constance Haw, MD;  Location: [place] CV LAB;  Service: Cardiovascular;  Laterality: N/A;  ? OOPHORECTOMY    ? PACEMAKER INSERTION Left 07/01/2015  ? Procedure: INSERTION PACEMAKER;  Surgeon: Isaias Cowman, MD;  Location: [facility] ORS;  Service: Cardiovascular;  Laterality: Left;  ? PACEMAKER REVISION N/A 08/28/2017  ? Procedure: PACEMAKER REVISION;  Surgeon: Deboraha Sprang, MD;  Location: [place] CV LAB;  Service: Cardiovascular;  Laterality: N/A;  ? REPLACEMENT TOTAL KNEE BILATERAL    ? SHOULDER ARTHROSCOPY WITH ROTATOR CUFF REPAIR AND OPEN BICEPS TENODESIS Left 11/21/2019  ? Procedure: LEFT SHOULDER ARTHROSCOPY WITH DEBRIDEMENT, DECOMPRESSION, ROTATOR  CUFF REPAIR AND BICEPS TENOLYSIS;  Surgeon: Corky Mull, MD;  Location: [facility] ORS;  Service: Orthopedics;  Laterality: Left;  ? SHOULDER ARTHROSCOPY WITH SUBACROMIAL DECOMPRESSION Left 2013  ? TEE WITHOUT CARDIOVERSION N/A 06/28/2017  ? Procedure: TRANSESOPHAGEAL ECHOCARDIOGRAM (TEE);  Surgeon: Corey Skains, MD;  Location: [facility] ORS;  Service: Cardiovascular;  Laterality: N/A;  ? ? ?Current Outpatient Medications  ?Medication Sig Dispense Refill  ? allopurinol (ZYLOPRIM) 100 MG tablet Take 150 mg by mouth daily.     ? amiodarone (PACERONE) 200 MG tablet TAKE 1 TABLET BY MOUTH ONCE DAILY (MUST  KEEP  FUTURE  APPOINTMENT  FOR  LONG  TERM  REFILLS) 90 tablet 0  ? apixaban (ELIQUIS) 2.5 MG TABS tablet Take 1 tablet (2.5 mg total) by mouth 2 (two) times daily. 180 tablet 1  ? busPIRone (BUSPAR) 10 MG tablet Take 10 mg by mouth 2 (two) times daily.     ? cholecalciferol (VITAMIN D3) 10 MCG (400 UNIT) TABS tablet Take 400 Units by mouth in the morning and at bedtime.     ? docusate sodium (COLACE) 100 MG capsule Take 200 mg by mouth 2 (two) times daily.    ? DULoxetine (CYMBALTA) 30 MG capsule Take 30 mg by mouth daily.     ? furosemide (LASIX) 20 MG tablet Take 20 mg by mouth

## 2021-11-29 DIAGNOSIS — H18831 Recurrent erosion of cornea, right eye: Secondary | ICD-10-CM | POA: Diagnosis not present

## 2021-11-30 DIAGNOSIS — H18831 Recurrent erosion of cornea, right eye: Secondary | ICD-10-CM | POA: Diagnosis not present

## 2021-11-30 LAB — CUP PACEART INCLINIC DEVICE CHECK
Battery Remaining Longevity: 102 mo
Battery Voltage: 2.92 V
Brady Statistic AP VP Percent: 0.03 %
Brady Statistic AP VS Percent: 96.3 %
Brady Statistic AS VP Percent: 0 %
Brady Statistic AS VS Percent: 3.66 %
Brady Statistic RA Percent Paced: 96.33 %
Brady Statistic RV Percent Paced: 0.04 %
Date Time Interrogation Session: 20230314125300
Implantable Lead Implant Date: 20161019
Implantable Lead Implant Date: 20181217
Implantable Lead Location: 753859
Implantable Lead Location: 753860
Implantable Lead Model: 5076
Implantable Lead Model: 5076
Implantable Pulse Generator Implant Date: 20181217
Lead Channel Impedance Value: 304 Ohm
Lead Channel Impedance Value: 361 Ohm
Lead Channel Impedance Value: 380 Ohm
Lead Channel Impedance Value: 418 Ohm
Lead Channel Pacing Threshold Amplitude: 0.75 V
Lead Channel Pacing Threshold Amplitude: 1 V
Lead Channel Pacing Threshold Pulse Width: 0.4 ms
Lead Channel Pacing Threshold Pulse Width: 0.4 ms
Lead Channel Sensing Intrinsic Amplitude: 3.25 mV
Lead Channel Sensing Intrinsic Amplitude: 4.625 mV
Lead Channel Sensing Intrinsic Amplitude: 4.875 mV
Lead Channel Sensing Intrinsic Amplitude: 5.125 mV
Lead Channel Setting Pacing Amplitude: 2 V
Lead Channel Setting Pacing Amplitude: 2.5 V
Lead Channel Setting Pacing Pulse Width: 0.4 ms
Lead Channel Setting Sensing Sensitivity: 1.2 mV

## 2021-12-03 DIAGNOSIS — H18831 Recurrent erosion of cornea, right eye: Secondary | ICD-10-CM | POA: Diagnosis not present

## 2021-12-06 DIAGNOSIS — H18831 Recurrent erosion of cornea, right eye: Secondary | ICD-10-CM | POA: Diagnosis not present

## 2021-12-07 ENCOUNTER — Telehealth: Payer: Self-pay

## 2021-12-07 DIAGNOSIS — R197 Diarrhea, unspecified: Secondary | ICD-10-CM

## 2021-12-07 NOTE — Progress Notes (Signed)
Remote pacemaker transmission.   

## 2021-12-07 NOTE — Telephone Encounter (Signed)
Patient left a voicemail and is having problems with her stool and has some questions  ?

## 2021-12-09 NOTE — Telephone Encounter (Signed)
Patient stated that she has had change in her bowel habits for one month but getting worse. Patient stated that when she uses the restroom, it is uncontrollable.She stated that it's loose. Patient stated that she had been under a lot of stress because her husband can't no longer come back home because he has dementia. Please advise. ?[phone] cell ?

## 2021-12-10 ENCOUNTER — Other Ambulatory Visit: Payer: Self-pay | Admitting: Gastroenterology

## 2021-12-10 NOTE — Telephone Encounter (Signed)
Called patient back and informed her what Dr. Vicente Males suggested. Patient agreed and stated that she will pass by on Monday afternoon to pick up the supplies to collect her stools. ?

## 2021-12-10 NOTE — Addendum Note (Signed)
Addended by: Wayna Chalet on: 12/10/2021 11:08 AM ? ? Modules accepted: Orders ? ?

## 2021-12-13 DIAGNOSIS — H18831 Recurrent erosion of cornea, right eye: Secondary | ICD-10-CM | POA: Diagnosis not present

## 2021-12-14 DIAGNOSIS — M6281 Muscle weakness (generalized): Secondary | ICD-10-CM | POA: Diagnosis not present

## 2021-12-14 DIAGNOSIS — I2510 Atherosclerotic heart disease of native coronary artery without angina pectoris: Secondary | ICD-10-CM | POA: Diagnosis not present

## 2021-12-14 DIAGNOSIS — J449 Chronic obstructive pulmonary disease, unspecified: Secondary | ICD-10-CM | POA: Diagnosis not present

## 2021-12-14 DIAGNOSIS — I5033 Acute on chronic diastolic (congestive) heart failure: Secondary | ICD-10-CM | POA: Diagnosis not present

## 2021-12-15 DIAGNOSIS — R197 Diarrhea, unspecified: Secondary | ICD-10-CM | POA: Diagnosis not present

## 2021-12-18 LAB — GI PROFILE, STOOL, PCR

## 2021-12-18 LAB — CALPROTECTIN, FECAL: Calprotectin, Fecal: 156 ug/g — ABNORMAL HIGH (ref 0–120)

## 2021-12-20 NOTE — Progress Notes (Signed)
Inform no infection in stool enquire how she is doing

## 2021-12-23 ENCOUNTER — Telehealth: Payer: Self-pay

## 2021-12-23 NOTE — Telephone Encounter (Signed)
-----   Message from Jonathon Bellows, MD sent at 12/20/2021  9:40 AM EDT ----- ?Inform no infection in stool enquire how she is doing  ?

## 2021-12-23 NOTE — Telephone Encounter (Signed)
Called and left a message for call back  

## 2021-12-24 NOTE — Telephone Encounter (Signed)
Patient verbalized understanding. States when she stressed out she will have the diarrhea and will not make it to the bathroom.  ?

## 2021-12-27 NOTE — Telephone Encounter (Signed)
Called and left a message for call back  

## 2021-12-27 NOTE — Telephone Encounter (Signed)
Inform if has diarrhea when she is not stressed to let us know otherwise can take imodium as needed for diarrhea related to stress

## 2021-12-28 DIAGNOSIS — S62501A Fracture of unspecified phalanx of right thumb, initial encounter for closed fracture: Secondary | ICD-10-CM | POA: Diagnosis not present

## 2021-12-28 DIAGNOSIS — S6992XA Unspecified injury of left wrist, hand and finger(s), initial encounter: Secondary | ICD-10-CM | POA: Diagnosis not present

## 2021-12-28 NOTE — Telephone Encounter (Signed)
Called patient verbalized understanding of instructions  

## 2021-12-31 DIAGNOSIS — S62525A Nondisplaced fracture of distal phalanx of left thumb, initial encounter for closed fracture: Secondary | ICD-10-CM | POA: Diagnosis not present

## 2021-12-31 DIAGNOSIS — M79645 Pain in left finger(s): Secondary | ICD-10-CM | POA: Diagnosis not present

## 2022-01-04 DIAGNOSIS — H524 Presbyopia: Secondary | ICD-10-CM | POA: Diagnosis not present

## 2022-01-04 DIAGNOSIS — H35371 Puckering of macula, right eye: Secondary | ICD-10-CM | POA: Diagnosis not present

## 2022-01-06 DIAGNOSIS — R8290 Unspecified abnormal findings in urine: Secondary | ICD-10-CM | POA: Diagnosis not present

## 2022-01-06 DIAGNOSIS — E1122 Type 2 diabetes mellitus with diabetic chronic kidney disease: Secondary | ICD-10-CM | POA: Diagnosis not present

## 2022-01-06 DIAGNOSIS — R32 Unspecified urinary incontinence: Secondary | ICD-10-CM | POA: Diagnosis not present

## 2022-01-06 DIAGNOSIS — I480 Paroxysmal atrial fibrillation: Secondary | ICD-10-CM | POA: Diagnosis not present

## 2022-01-06 DIAGNOSIS — R300 Dysuria: Secondary | ICD-10-CM | POA: Diagnosis not present

## 2022-01-10 DIAGNOSIS — D225 Melanocytic nevi of trunk: Secondary | ICD-10-CM | POA: Diagnosis not present

## 2022-01-10 DIAGNOSIS — Z85828 Personal history of other malignant neoplasm of skin: Secondary | ICD-10-CM | POA: Diagnosis not present

## 2022-01-10 DIAGNOSIS — D2261 Melanocytic nevi of right upper limb, including shoulder: Secondary | ICD-10-CM | POA: Diagnosis not present

## 2022-01-10 DIAGNOSIS — D2262 Melanocytic nevi of left upper limb, including shoulder: Secondary | ICD-10-CM | POA: Diagnosis not present

## 2022-01-13 ENCOUNTER — Other Ambulatory Visit: Payer: Self-pay | Admitting: Gastroenterology

## 2022-01-13 DIAGNOSIS — I5033 Acute on chronic diastolic (congestive) heart failure: Secondary | ICD-10-CM | POA: Diagnosis not present

## 2022-01-13 DIAGNOSIS — E1140 Type 2 diabetes mellitus with diabetic neuropathy, unspecified: Secondary | ICD-10-CM | POA: Diagnosis not present

## 2022-01-13 DIAGNOSIS — M6281 Muscle weakness (generalized): Secondary | ICD-10-CM | POA: Diagnosis not present

## 2022-01-13 DIAGNOSIS — E1122 Type 2 diabetes mellitus with diabetic chronic kidney disease: Secondary | ICD-10-CM | POA: Diagnosis not present

## 2022-01-13 DIAGNOSIS — I779 Disorder of arteries and arterioles, unspecified: Secondary | ICD-10-CM | POA: Diagnosis not present

## 2022-01-13 DIAGNOSIS — J449 Chronic obstructive pulmonary disease, unspecified: Secondary | ICD-10-CM | POA: Diagnosis not present

## 2022-01-13 DIAGNOSIS — I480 Paroxysmal atrial fibrillation: Secondary | ICD-10-CM | POA: Diagnosis not present

## 2022-01-13 DIAGNOSIS — I2510 Atherosclerotic heart disease of native coronary artery without angina pectoris: Secondary | ICD-10-CM | POA: Diagnosis not present

## 2022-01-17 DIAGNOSIS — M25562 Pain in left knee: Secondary | ICD-10-CM | POA: Diagnosis not present

## 2022-01-21 DIAGNOSIS — S62525D Nondisplaced fracture of distal phalanx of left thumb, subsequent encounter for fracture with routine healing: Secondary | ICD-10-CM | POA: Diagnosis not present

## 2022-01-21 DIAGNOSIS — S63642D Sprain of metacarpophalangeal joint of left thumb, subsequent encounter: Secondary | ICD-10-CM | POA: Diagnosis not present

## 2022-02-04 ENCOUNTER — Ambulatory Visit
Admission: RE | Admit: 2022-02-04 | Discharge: 2022-02-04 | Disposition: A | Discharge status: Home / Self Care | Payer: Medicare Other | Source: Ambulatory Visit | Attending: Internal Medicine | Admitting: Internal Medicine

## 2022-02-04 DIAGNOSIS — Z1231 Encounter for screening mammogram for malignant neoplasm of breast: Secondary | ICD-10-CM | POA: Insufficient documentation

## 2022-02-09 DIAGNOSIS — M1A0710 Idiopathic chronic gout, right ankle and foot, without tophus (tophi): Secondary | ICD-10-CM | POA: Diagnosis not present

## 2022-02-13 ENCOUNTER — Other Ambulatory Visit: Payer: Self-pay | Admitting: Internal Medicine

## 2022-02-13 ENCOUNTER — Other Ambulatory Visit: Payer: Self-pay | Admitting: Gastroenterology

## 2022-02-13 DIAGNOSIS — I5033 Acute on chronic diastolic (congestive) heart failure: Secondary | ICD-10-CM | POA: Diagnosis not present

## 2022-02-13 DIAGNOSIS — M6281 Muscle weakness (generalized): Secondary | ICD-10-CM | POA: Diagnosis not present

## 2022-02-13 DIAGNOSIS — I2510 Atherosclerotic heart disease of native coronary artery without angina pectoris: Secondary | ICD-10-CM | POA: Diagnosis not present

## 2022-02-13 DIAGNOSIS — I480 Paroxysmal atrial fibrillation: Secondary | ICD-10-CM

## 2022-02-13 DIAGNOSIS — J449 Chronic obstructive pulmonary disease, unspecified: Secondary | ICD-10-CM | POA: Diagnosis not present

## 2022-02-22 ENCOUNTER — Ambulatory Visit (INDEPENDENT_AMBULATORY_CARE_PROVIDER_SITE_OTHER): Payer: Medicare Other

## 2022-02-22 DIAGNOSIS — I495 Sick sinus syndrome: Secondary | ICD-10-CM | POA: Diagnosis not present

## 2022-02-22 LAB — CUP PACEART REMOTE DEVICE CHECK
Battery Remaining Longevity: 100 mo
Battery Voltage: 2.92 V
Brady Statistic AP VP Percent: 0.06 %
Brady Statistic AP VS Percent: 87.96 %
Brady Statistic AS VP Percent: 0 %
Brady Statistic AS VS Percent: 11.98 %
Brady Statistic RA Percent Paced: 88.12 %
Brady Statistic RV Percent Paced: 0.06 %
Date Time Interrogation Session: 20230613013255
Implantable Lead Implant Date: 20161019
Implantable Lead Implant Date: 20181217
Implantable Lead Location: 753859
Implantable Lead Location: 753860
Implantable Lead Model: 5076
Implantable Lead Model: 5076
Implantable Pulse Generator Implant Date: 20181217
Lead Channel Impedance Value: 304 Ohm
Lead Channel Impedance Value: 361 Ohm
Lead Channel Impedance Value: 399 Ohm
Lead Channel Impedance Value: 418 Ohm
Lead Channel Pacing Threshold Amplitude: 0.75 V
Lead Channel Pacing Threshold Amplitude: 0.875 V
Lead Channel Pacing Threshold Pulse Width: 0.4 ms
Lead Channel Pacing Threshold Pulse Width: 0.4 ms
Lead Channel Sensing Intrinsic Amplitude: 4.5 mV
Lead Channel Sensing Intrinsic Amplitude: 4.5 mV
Lead Channel Sensing Intrinsic Amplitude: 5.125 mV
Lead Channel Sensing Intrinsic Amplitude: 5.125 mV
Lead Channel Setting Pacing Amplitude: 2 V
Lead Channel Setting Pacing Amplitude: 2.5 V
Lead Channel Setting Pacing Pulse Width: 0.4 ms
Lead Channel Setting Sensing Sensitivity: 1.2 mV

## 2022-02-28 DIAGNOSIS — M19049 Primary osteoarthritis, unspecified hand: Secondary | ICD-10-CM | POA: Diagnosis not present

## 2022-03-01 ENCOUNTER — Encounter: Payer: Self-pay | Admitting: *Deleted

## 2022-03-01 ENCOUNTER — Emergency Department: Payer: Medicare Other

## 2022-03-01 ENCOUNTER — Emergency Department
Admission: EM | Admit: 2022-03-01 | Discharge: 2022-03-01 | Disposition: A | Discharge status: Home / Self Care | Payer: Medicare Other | Attending: Emergency Medicine | Admitting: Emergency Medicine

## 2022-03-01 ENCOUNTER — Other Ambulatory Visit: Payer: Self-pay

## 2022-03-01 DIAGNOSIS — M7989 Other specified soft tissue disorders: Secondary | ICD-10-CM | POA: Diagnosis present

## 2022-03-01 DIAGNOSIS — I130 Hypertensive heart and chronic kidney disease with heart failure and stage 1 through stage 4 chronic kidney disease, or unspecified chronic kidney disease: Secondary | ICD-10-CM | POA: Diagnosis not present

## 2022-03-01 DIAGNOSIS — Z7901 Long term (current) use of anticoagulants: Secondary | ICD-10-CM | POA: Diagnosis not present

## 2022-03-01 DIAGNOSIS — E1122 Type 2 diabetes mellitus with diabetic chronic kidney disease: Secondary | ICD-10-CM | POA: Insufficient documentation

## 2022-03-01 DIAGNOSIS — N189 Chronic kidney disease, unspecified: Secondary | ICD-10-CM | POA: Diagnosis not present

## 2022-03-01 DIAGNOSIS — I509 Heart failure, unspecified: Secondary | ICD-10-CM | POA: Diagnosis not present

## 2022-03-01 DIAGNOSIS — M79662 Pain in left lower leg: Secondary | ICD-10-CM | POA: Diagnosis not present

## 2022-03-01 DIAGNOSIS — R600 Localized edema: Secondary | ICD-10-CM | POA: Insufficient documentation

## 2022-03-01 DIAGNOSIS — R609 Edema, unspecified: Secondary | ICD-10-CM

## 2022-03-01 NOTE — ED Provider Notes (Signed)
[facility] Provider Note    Event Date/Time   First MD Initiated Contact with Patient 03/01/22 2016     (approximate)  History   Chief Complaint: Leg Swelling  HPI  Deborah Obrien is a 81 y.o. female with a past medical history of anemia, anxiety, CHF, CKD, diabetes, hypertension, paroxysmal atrial fibrillation, presents to the emergency department for left leg swelling.  According to the patient over the past month or 2 she has been experiencing intermittent swelling and some discomfort mostly in the left leg.  Patient states she has seen her doctor for the same he did not believe it was consistent with a blood clot.  She states the symptoms seem to resolve however over the past several days the swelling seems to have returned so she came to the emergency department for evaluation.  Patient denies any chest pain denies any new shortness of breath but states a history of pulmonary fibrosis and mild shortness of breath at all times.  No history of DVT previously.  Patient appears to be on Eliquis per record review.  Physical Exam   Triage Vital Signs: ED Triage Vitals  Enc Vitals Group     BP 03/01/22 1853 116/65     Pulse Rate 03/01/22 1853 65     Resp 03/01/22 1853 20     Temp 03/01/22 1853 98.5 F (36.9 C)     Temp Source 03/01/22 1853 Oral     SpO2 03/01/22 1853 99 %     Weight 03/01/22 1850 166 lb (75.3 kg)     Height 03/01/22 1850 5\' 2"  (1.575 m)     Head Circumference --      Peak Flow --      Pain Score 03/01/22 1850 5     Pain Loc --      Pain Edu? --      Excl. in [place]? --     Most recent vital signs: Vitals:   03/01/22 1853  BP: 116/65  Pulse: 65  Resp: 20  Temp: 98.5 F (36.9 C)  SpO2: 99%    General: Awake, no distress.  CV:  Good peripheral perfusion.  Regular rate and rhythm  Resp:  Normal effort.  Equal breath sounds bilaterally.  Abd:  No distention.  Soft, nontender.  No rebound or guarding. Other:  Patient has mild  lower extremity edema bilaterally left somewhat greater than right.  Mild calf tenderness bilaterally as well.  Neurovascular intact.   ED Results / Procedures / Treatments   RADIOLOGY  Ultrasound of the leg is negative for DVT   MEDICATIONS ORDERED IN ED: Medications - No data to display   IMPRESSION / MDM / [place] / ED COURSE  I reviewed the triage vital signs and the nursing notes.  Patient's presentation is most consistent with acute illness / injury with system symptoms.  Patient presents emergency department for left leg swelling and discomfort intermittent over the past 2 months.  Overall the patient appears well she does have mild lower extremity IMA however appears to be fairly equal may be slightly more on the left.  Given the calf tenderness we will obtain an ultrasound to further evaluate.  Patient's ultrasound resulted negative for DVT.  Patient is otherwise well-appearing.  We will have her follow-up with her PCP.  Patient reassured and agreeable to plan of care.  FINAL CLINICAL IMPRESSION(S) / ED DIAGNOSES   Peripheral edema    Note:  This document was  prepared using Systems analyst and may include unintentional dictation errors.   Harvest Dark, MD 03/01/22 2052

## 2022-03-01 NOTE — ED Triage Notes (Signed)
Pt sent to er for eval of dvt of left leg.  Pt has sx for 2-3 weeks.  Hx gout.  No known injury to leg.  Pt alert  speech clear.

## 2022-03-02 ENCOUNTER — Other Ambulatory Visit: Payer: Self-pay | Admitting: Nephrology

## 2022-03-02 DIAGNOSIS — I10 Essential (primary) hypertension: Secondary | ICD-10-CM

## 2022-03-02 DIAGNOSIS — M054 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: Secondary | ICD-10-CM | POA: Diagnosis not present

## 2022-03-02 DIAGNOSIS — D631 Anemia in chronic kidney disease: Secondary | ICD-10-CM | POA: Diagnosis not present

## 2022-03-02 DIAGNOSIS — N184 Chronic kidney disease, stage 4 (severe): Secondary | ICD-10-CM | POA: Diagnosis not present

## 2022-03-02 DIAGNOSIS — I509 Heart failure, unspecified: Secondary | ICD-10-CM | POA: Insufficient documentation

## 2022-03-02 DIAGNOSIS — N2581 Secondary hyperparathyroidism of renal origin: Secondary | ICD-10-CM

## 2022-03-02 DIAGNOSIS — R82998 Other abnormal findings in urine: Secondary | ICD-10-CM | POA: Diagnosis not present

## 2022-03-04 ENCOUNTER — Encounter: Payer: Self-pay | Admitting: Emergency Medicine

## 2022-03-04 ENCOUNTER — Other Ambulatory Visit: Payer: Self-pay

## 2022-03-04 ENCOUNTER — Observation Stay
Admission: EM | Admit: 2022-03-04 | Discharge: 2022-03-06 | Disposition: A | Discharge status: Home / Self Care | Payer: Medicare Other | Attending: Osteopathic Medicine | Admitting: Osteopathic Medicine

## 2022-03-04 ENCOUNTER — Emergency Department: Payer: Medicare Other

## 2022-03-04 DIAGNOSIS — Z8673 Personal history of transient ischemic attack (TIA), and cerebral infarction without residual deficits: Secondary | ICD-10-CM | POA: Insufficient documentation

## 2022-03-04 DIAGNOSIS — E1140 Type 2 diabetes mellitus with diabetic neuropathy, unspecified: Secondary | ICD-10-CM | POA: Insufficient documentation

## 2022-03-04 DIAGNOSIS — Z833 Family history of diabetes mellitus: Secondary | ICD-10-CM | POA: Insufficient documentation

## 2022-03-04 DIAGNOSIS — Z91041 Radiographic dye allergy status: Secondary | ICD-10-CM

## 2022-03-04 DIAGNOSIS — K760 Fatty (change of) liver, not elsewhere classified: Secondary | ICD-10-CM | POA: Diagnosis not present

## 2022-03-04 DIAGNOSIS — I129 Hypertensive chronic kidney disease with stage 1 through stage 4 chronic kidney disease, or unspecified chronic kidney disease: Secondary | ICD-10-CM | POA: Diagnosis not present

## 2022-03-04 DIAGNOSIS — F331 Major depressive disorder, recurrent, moderate: Secondary | ICD-10-CM | POA: Diagnosis present

## 2022-03-04 DIAGNOSIS — M810 Age-related osteoporosis without current pathological fracture: Secondary | ICD-10-CM | POA: Diagnosis not present

## 2022-03-04 DIAGNOSIS — K219 Gastro-esophageal reflux disease without esophagitis: Secondary | ICD-10-CM | POA: Diagnosis not present

## 2022-03-04 DIAGNOSIS — Z85828 Personal history of other malignant neoplasm of skin: Secondary | ICD-10-CM

## 2022-03-04 DIAGNOSIS — Z8249 Family history of ischemic heart disease and other diseases of the circulatory system: Secondary | ICD-10-CM | POA: Insufficient documentation

## 2022-03-04 DIAGNOSIS — E860 Dehydration: Secondary | ICD-10-CM | POA: Diagnosis not present

## 2022-03-04 DIAGNOSIS — R197 Diarrhea, unspecified: Secondary | ICD-10-CM | POA: Diagnosis not present

## 2022-03-04 DIAGNOSIS — Z96653 Presence of artificial knee joint, bilateral: Secondary | ICD-10-CM | POA: Diagnosis not present

## 2022-03-04 DIAGNOSIS — I495 Sick sinus syndrome: Secondary | ICD-10-CM | POA: Diagnosis present

## 2022-03-04 DIAGNOSIS — I959 Hypotension, unspecified: Secondary | ICD-10-CM | POA: Diagnosis not present

## 2022-03-04 DIAGNOSIS — I480 Paroxysmal atrial fibrillation: Secondary | ICD-10-CM | POA: Diagnosis not present

## 2022-03-04 DIAGNOSIS — M109 Gout, unspecified: Secondary | ICD-10-CM | POA: Diagnosis not present

## 2022-03-04 DIAGNOSIS — I700 Atherosclerosis of aorta: Secondary | ICD-10-CM | POA: Diagnosis not present

## 2022-03-04 DIAGNOSIS — E11649 Type 2 diabetes mellitus with hypoglycemia without coma: Secondary | ICD-10-CM | POA: Diagnosis not present

## 2022-03-04 DIAGNOSIS — I779 Disorder of arteries and arterioles, unspecified: Secondary | ICD-10-CM | POA: Diagnosis not present

## 2022-03-04 DIAGNOSIS — M1990 Unspecified osteoarthritis, unspecified site: Secondary | ICD-10-CM | POA: Diagnosis not present

## 2022-03-04 DIAGNOSIS — E861 Hypovolemia: Secondary | ICD-10-CM | POA: Insufficient documentation

## 2022-03-04 DIAGNOSIS — Z794 Long term (current) use of insulin: Secondary | ICD-10-CM | POA: Diagnosis not present

## 2022-03-04 DIAGNOSIS — Z91013 Allergy to seafood: Secondary | ICD-10-CM

## 2022-03-04 DIAGNOSIS — E782 Mixed hyperlipidemia: Secondary | ICD-10-CM | POA: Diagnosis not present

## 2022-03-04 DIAGNOSIS — F3340 Major depressive disorder, recurrent, in remission, unspecified: Secondary | ICD-10-CM | POA: Diagnosis present

## 2022-03-04 DIAGNOSIS — G8929 Other chronic pain: Secondary | ICD-10-CM | POA: Diagnosis not present

## 2022-03-04 DIAGNOSIS — Z888 Allergy status to other drugs, medicaments and biological substances status: Secondary | ICD-10-CM

## 2022-03-04 DIAGNOSIS — Z950 Presence of cardiac pacemaker: Secondary | ICD-10-CM | POA: Insufficient documentation

## 2022-03-04 DIAGNOSIS — N183 Chronic kidney disease, stage 3 unspecified: Secondary | ICD-10-CM | POA: Diagnosis present

## 2022-03-04 DIAGNOSIS — R457 State of emotional shock and stress, unspecified: Secondary | ICD-10-CM | POA: Diagnosis not present

## 2022-03-04 DIAGNOSIS — N3946 Mixed incontinence: Secondary | ICD-10-CM | POA: Diagnosis present

## 2022-03-04 DIAGNOSIS — E1165 Type 2 diabetes mellitus with hyperglycemia: Secondary | ICD-10-CM | POA: Diagnosis not present

## 2022-03-04 DIAGNOSIS — Z79890 Hormone replacement therapy (postmenopausal): Secondary | ICD-10-CM | POA: Insufficient documentation

## 2022-03-04 DIAGNOSIS — D631 Anemia in chronic kidney disease: Secondary | ICD-10-CM | POA: Insufficient documentation

## 2022-03-04 DIAGNOSIS — Z882 Allergy status to sulfonamides status: Secondary | ICD-10-CM

## 2022-03-04 DIAGNOSIS — Z79899 Other long term (current) drug therapy: Secondary | ICD-10-CM | POA: Insufficient documentation

## 2022-03-04 DIAGNOSIS — I10 Essential (primary) hypertension: Secondary | ICD-10-CM | POA: Diagnosis present

## 2022-03-04 DIAGNOSIS — N1831 Chronic kidney disease, stage 3a: Secondary | ICD-10-CM | POA: Diagnosis not present

## 2022-03-04 DIAGNOSIS — R1084 Generalized abdominal pain: Secondary | ICD-10-CM | POA: Diagnosis not present

## 2022-03-04 DIAGNOSIS — E161 Other hypoglycemia: Secondary | ICD-10-CM | POA: Diagnosis not present

## 2022-03-04 DIAGNOSIS — R9431 Abnormal electrocardiogram [ECG] [EKG]: Secondary | ICD-10-CM | POA: Diagnosis not present

## 2022-03-04 DIAGNOSIS — I4821 Permanent atrial fibrillation: Secondary | ICD-10-CM | POA: Diagnosis present

## 2022-03-04 DIAGNOSIS — M48062 Spinal stenosis, lumbar region with neurogenic claudication: Secondary | ICD-10-CM | POA: Diagnosis present

## 2022-03-04 DIAGNOSIS — I2510 Atherosclerotic heart disease of native coronary artery without angina pectoris: Secondary | ICD-10-CM | POA: Diagnosis not present

## 2022-03-04 DIAGNOSIS — E162 Hypoglycemia, unspecified: Secondary | ICD-10-CM | POA: Diagnosis not present

## 2022-03-04 DIAGNOSIS — R109 Unspecified abdominal pain: Secondary | ICD-10-CM | POA: Diagnosis not present

## 2022-03-04 DIAGNOSIS — Z886 Allergy status to analgesic agent status: Secondary | ICD-10-CM

## 2022-03-04 DIAGNOSIS — R42 Dizziness and giddiness: Secondary | ICD-10-CM | POA: Diagnosis not present

## 2022-03-04 DIAGNOSIS — E785 Hyperlipidemia, unspecified: Secondary | ICD-10-CM | POA: Diagnosis present

## 2022-03-04 DIAGNOSIS — Z7901 Long term (current) use of anticoagulants: Secondary | ICD-10-CM | POA: Insufficient documentation

## 2022-03-04 DIAGNOSIS — E1122 Type 2 diabetes mellitus with diabetic chronic kidney disease: Secondary | ICD-10-CM | POA: Insufficient documentation

## 2022-03-04 DIAGNOSIS — R8290 Unspecified abnormal findings in urine: Secondary | ICD-10-CM

## 2022-03-04 DIAGNOSIS — E039 Hypothyroidism, unspecified: Secondary | ICD-10-CM | POA: Diagnosis not present

## 2022-03-04 DIAGNOSIS — Z881 Allergy status to other antibiotic agents status: Secondary | ICD-10-CM

## 2022-03-04 DIAGNOSIS — Z885 Allergy status to narcotic agent status: Secondary | ICD-10-CM

## 2022-03-04 HISTORY — DX: Type 2 diabetes mellitus with hypoglycemia without coma: E11.649

## 2022-03-04 HISTORY — DX: Diarrhea, unspecified: R19.7

## 2022-03-04 HISTORY — DX: Type 2 diabetes mellitus with hypoglycemia without coma: Z79.4

## 2022-03-04 LAB — GASTROINTESTINAL PANEL BY PCR, STOOL (REPLACES STOOL CULTURE)

## 2022-03-04 LAB — CBG MONITORING, ED
Glucose-Capillary: 42 mg/dL — CL (ref 70–99)
Glucose-Capillary: 48 mg/dL — ABNORMAL LOW (ref 70–99)
Glucose-Capillary: 73 mg/dL (ref 70–99)
Glucose-Capillary: 160 mg/dL — ABNORMAL HIGH (ref 70–99)
Glucose-Capillary: 91 mg/dL (ref 70–99)
Glucose-Capillary: 80 mg/dL (ref 70–99)
Glucose-Capillary: 85 mg/dL (ref 70–99)

## 2022-03-04 LAB — CBC WITH DIFFERENTIAL/PLATELET
Abs Immature Granulocytes: 0.01 10*3/uL (ref 0.00–0.07)
Basophils Absolute: 0 10*3/uL (ref 0.0–0.1)
Basophils Relative: 1 %
Eosinophils Absolute: 0.1 10*3/uL (ref 0.0–0.5)
Eosinophils Relative: 2 %
HCT: 33.5 % — ABNORMAL LOW (ref 36.0–46.0)
Hemoglobin: 10.9 g/dL — ABNORMAL LOW (ref 12.0–15.0)
Immature Granulocytes: 0 %
Lymphocytes Relative: 28 %
Lymphs Abs: 1.3 10*3/uL (ref 0.7–4.0)
MCH: 32 pg (ref 26.0–34.0)
MCHC: 32.5 g/dL (ref 30.0–36.0)
MCV: 98.2 fL (ref 80.0–100.0)
Monocytes Absolute: 0.3 10*3/uL (ref 0.1–1.0)
Monocytes Relative: 7 %
Neutro Abs: 2.9 10*3/uL (ref 1.7–7.7)
Neutrophils Relative %: 62 %
Platelets: 143 10*3/uL — ABNORMAL LOW (ref 150–400)
RBC: 3.41 MIL/uL — ABNORMAL LOW (ref 3.87–5.11)
RDW: 13.2 % (ref 11.5–15.5)
WBC: 4.7 10*3/uL (ref 4.0–10.5)
nRBC: 0 % (ref 0.0–0.2)

## 2022-03-04 LAB — URINALYSIS, COMPLETE (UACMP) WITH MICROSCOPIC
Bilirubin Urine: NEGATIVE
Glucose, UA: >=500 mg/dL — AB
Hgb urine dipstick: NEGATIVE
Ketones, ur: NEGATIVE mg/dL
Nitrite: NEGATIVE
Protein, ur: NEGATIVE mg/dL
Specific Gravity, Urine: 1.007 (ref 1.005–1.030)
pH: 5 (ref 5.0–8.0)

## 2022-03-04 LAB — COMPREHENSIVE METABOLIC PANEL
ALT: 29 U/L (ref 0–44)
AST: 51 U/L — ABNORMAL HIGH (ref 15–41)
Albumin: 3.6 g/dL (ref 3.5–5.0)
Alkaline Phosphatase: 127 U/L — ABNORMAL HIGH (ref 38–126)
Anion gap: 7 (ref 5–15)
BUN: 33 mg/dL — ABNORMAL HIGH (ref 8–23)
CO2: 26 mmol/L (ref 22–32)
Calcium: 9.2 mg/dL (ref 8.9–10.3)
Chloride: 106 mmol/L (ref 98–111)
Creatinine, Ser: 1.58 mg/dL — ABNORMAL HIGH (ref 0.44–1.00)
GFR, Estimated: 33 mL/min — ABNORMAL LOW (ref >60)
Glucose, Bld: 45 mg/dL — ABNORMAL LOW (ref 70–99)
Potassium: 4 mmol/L (ref 3.5–5.1)
Sodium: 139 mmol/L (ref 135–145)
Total Bilirubin: 0.7 mg/dL (ref 0.3–1.2)
Total Protein: 6.5 g/dL (ref 6.5–8.1)

## 2022-03-04 LAB — C DIFFICILE QUICK SCREEN W PCR REFLEX
C Diff antigen: NEGATIVE
C Diff interpretation: NOT DETECTED
C Diff toxin: NEGATIVE

## 2022-03-04 LAB — GLUCOSE, CAPILLARY: Glucose-Capillary: 137 mg/dL — ABNORMAL HIGH (ref 70–99)

## 2022-03-04 LAB — LIPASE, BLOOD: Lipase: 25 U/L (ref 11–51)

## 2022-03-04 MED ORDER — APIXABAN 2.5 MG PO TABS
2.5000 mg | ORAL_TABLET | Freq: Two times a day (BID) | ORAL | Status: DC
  Administered 2022-03-04 – 2022-03-06 (×4): 2.5 mg via ORAL
  Filled 2022-03-04 (×5): qty 1

## 2022-03-04 MED ORDER — AMIODARONE HCL 200 MG PO TABS
200.0000 mg | ORAL_TABLET | Freq: Every day | ORAL | Status: DC
  Administered 2022-03-05 – 2022-03-06 (×2): 200 mg via ORAL
  Filled 2022-03-04 (×2): qty 1

## 2022-03-04 MED ORDER — BUSPIRONE HCL 10 MG PO TABS
10.0000 mg | ORAL_TABLET | Freq: Two times a day (BID) | ORAL | Status: DC
  Administered 2022-03-04 – 2022-03-06 (×4): 10 mg via ORAL
  Filled 2022-03-04 (×4): qty 1

## 2022-03-04 MED ORDER — ALUM & MAG HYDROXIDE-SIMETH 200-200-20 MG/5ML PO SUSP
30.0000 mL | Freq: Once | ORAL | Status: AC
Start: 2022-03-04 — End: 2022-03-04
  Administered 2022-03-04: 30 mL via ORAL
  Filled 2022-03-04: qty 30

## 2022-03-04 MED ORDER — LACTATED RINGERS IV BOLUS: 1000.0000 mL | Freq: Once | INTRAVENOUS | Status: DC

## 2022-03-04 MED ORDER — INSULIN ASPART 100 UNIT/ML IJ SOLN: 0.0000 [IU] | Freq: Every day | INTRAMUSCULAR | Status: DC

## 2022-03-04 MED ORDER — DEXTROSE 50 % IV SOLN: 1.0000 | Freq: Once | INTRAVENOUS | Status: DC

## 2022-03-04 MED ORDER — DOCUSATE SODIUM 100 MG PO CAPS
200.0000 mg | ORAL_CAPSULE | Freq: Two times a day (BID) | ORAL | Status: DC
  Administered 2022-03-04 – 2022-03-06 (×4): 200 mg via ORAL
  Filled 2022-03-04 (×4): qty 2

## 2022-03-04 MED ORDER — DEXTROSE 50 % IV SOLN
INTRAVENOUS | Status: AC
  Filled 2022-03-04: qty 50

## 2022-03-04 MED ORDER — DEXTROSE 10 % IV SOLN: INTRAVENOUS | Status: DC

## 2022-03-04 MED ORDER — TRAZODONE HCL 50 MG PO TABS
50.0000 mg | ORAL_TABLET | Freq: Every day | ORAL | Status: DC
  Administered 2022-03-04 – 2022-03-05 (×2): 50 mg via ORAL
  Filled 2022-03-04 (×2): qty 1

## 2022-03-04 MED ORDER — OXYBUTYNIN CHLORIDE 5 MG PO TABS
10.0000 mg | ORAL_TABLET | Freq: Two times a day (BID) | ORAL | Status: DC
  Administered 2022-03-04 – 2022-03-06 (×4): 10 mg via ORAL
  Filled 2022-03-04 (×5): qty 2

## 2022-03-04 MED ORDER — FLEET ENEMA 7-19 GM/118ML RE ENEM: 1.0000 | ENEMA | Freq: Once | RECTAL | Status: DC

## 2022-03-04 MED ORDER — ALLOPURINOL 300 MG PO TABS
150.0000 mg | ORAL_TABLET | Freq: Every day | ORAL | Status: DC
  Administered 2022-03-05 – 2022-03-06 (×2): 150 mg via ORAL
  Filled 2022-03-04 (×2): qty 1

## 2022-03-04 MED ORDER — SENNA 8.6 MG PO TABS
2.0000 | ORAL_TABLET | Freq: Two times a day (BID) | ORAL | Status: DC
  Administered 2022-03-04 – 2022-03-06 (×4): 17.2 mg via ORAL
  Filled 2022-03-04 (×4): qty 2

## 2022-03-04 MED ORDER — SODIUM CHLORIDE 0.9 % IV SOLN: 250.0000 mL | INTRAVENOUS | Status: DC | PRN

## 2022-03-04 MED ORDER — SODIUM CHLORIDE 0.9% FLUSH
3.0000 mL | Freq: Two times a day (BID) | INTRAVENOUS | Status: DC
Start: 2022-03-04 — End: 2022-03-06
  Administered 2022-03-04 – 2022-03-06 (×4): 3 mL via INTRAVENOUS

## 2022-03-04 MED ORDER — PANTOPRAZOLE SODIUM 40 MG PO TBEC
40.0000 mg | DELAYED_RELEASE_TABLET | Freq: Every day | ORAL | Status: DC
  Administered 2022-03-05 – 2022-03-06 (×2): 40 mg via ORAL
  Filled 2022-03-04 (×2): qty 1

## 2022-03-04 MED ORDER — INSULIN ASPART 100 UNIT/ML IJ SOLN
0.0000 [IU] | Freq: Three times a day (TID) | INTRAMUSCULAR | Status: DC
  Administered 2022-03-05: 15 [IU] via SUBCUTANEOUS
  Filled 2022-03-04: qty 1

## 2022-03-04 MED ORDER — SODIUM CHLORIDE 0.9% FLUSH
3.0000 mL | INTRAVENOUS | Status: DC | PRN
  Administered 2022-03-06: 3 mL via INTRAVENOUS

## 2022-03-04 MED ORDER — PRAVASTATIN SODIUM 20 MG PO TABS
20.0000 mg | ORAL_TABLET | Freq: Every day | ORAL | Status: DC
  Administered 2022-03-04 – 2022-03-05 (×2): 20 mg via ORAL
  Filled 2022-03-04 (×2): qty 1

## 2022-03-04 MED ORDER — ENOXAPARIN SODIUM 40 MG/0.4ML IJ SOSY: 40.0000 mg | PREFILLED_SYRINGE | INTRAMUSCULAR | Status: DC

## 2022-03-04 MED ORDER — TRAMADOL HCL 50 MG PO TABS: 50.0000 mg | ORAL_TABLET | Freq: Four times a day (QID) | ORAL | Status: DC | PRN

## 2022-03-04 MED ORDER — LEVOTHYROXINE SODIUM 50 MCG PO TABS
50.0000 ug | ORAL_TABLET | Freq: Every day | ORAL | Status: DC
  Administered 2022-03-05 – 2022-03-06 (×2): 50 ug via ORAL
  Filled 2022-03-04 (×2): qty 1

## 2022-03-04 MED ORDER — HYDROCODONE-ACETAMINOPHEN 10-325 MG PO TABS: 1.0000 | ORAL_TABLET | ORAL | Status: DC | PRN

## 2022-03-04 MED ORDER — GABAPENTIN 400 MG PO CAPS
800.0000 mg | ORAL_CAPSULE | Freq: Three times a day (TID) | ORAL | Status: DC
  Administered 2022-03-04 – 2022-03-06 (×6): 800 mg via ORAL
  Filled 2022-03-04 (×7): qty 2

## 2022-03-04 MED ORDER — DULOXETINE HCL 30 MG PO CPEP
30.0000 mg | ORAL_CAPSULE | Freq: Every day | ORAL | Status: DC
  Administered 2022-03-05 – 2022-03-06 (×2): 30 mg via ORAL
  Filled 2022-03-04 (×2): qty 1

## 2022-03-04 NOTE — Assessment & Plan Note (Addendum)
A1c 10.8 on 12/2021 Home insulin 70/30 and reports hypo/hyperglycemia at home Likely recently worse w/ poor po intake and diarrhea  Consult to diabetes coordinator pharmacy, appreciate assistance with adjustment of the insulin and confirming insurance coverage.  SSI ac  Semglee daily   Repeat A1C

## 2022-03-04 NOTE — Assessment & Plan Note (Signed)
Continue PPI ?

## 2022-03-05 DIAGNOSIS — F3340 Major depressive disorder, recurrent, in remission, unspecified: Secondary | ICD-10-CM | POA: Diagnosis present

## 2022-03-05 DIAGNOSIS — D631 Anemia in chronic kidney disease: Secondary | ICD-10-CM | POA: Diagnosis present

## 2022-03-05 DIAGNOSIS — E1140 Type 2 diabetes mellitus with diabetic neuropathy, unspecified: Secondary | ICD-10-CM | POA: Diagnosis present

## 2022-03-05 DIAGNOSIS — E861 Hypovolemia: Secondary | ICD-10-CM | POA: Diagnosis present

## 2022-03-05 DIAGNOSIS — E11649 Type 2 diabetes mellitus with hypoglycemia without coma: Secondary | ICD-10-CM | POA: Diagnosis not present

## 2022-03-05 DIAGNOSIS — K219 Gastro-esophageal reflux disease without esophagitis: Secondary | ICD-10-CM | POA: Diagnosis present

## 2022-03-05 DIAGNOSIS — N1831 Chronic kidney disease, stage 3a: Secondary | ICD-10-CM | POA: Diagnosis present

## 2022-03-05 DIAGNOSIS — E785 Hyperlipidemia, unspecified: Secondary | ICD-10-CM | POA: Diagnosis present

## 2022-03-05 DIAGNOSIS — G8929 Other chronic pain: Secondary | ICD-10-CM | POA: Diagnosis present

## 2022-03-05 DIAGNOSIS — E1122 Type 2 diabetes mellitus with diabetic chronic kidney disease: Secondary | ICD-10-CM | POA: Diagnosis present

## 2022-03-05 DIAGNOSIS — E162 Hypoglycemia, unspecified: Secondary | ICD-10-CM | POA: Diagnosis present

## 2022-03-05 DIAGNOSIS — E782 Mixed hyperlipidemia: Secondary | ICD-10-CM | POA: Diagnosis present

## 2022-03-05 DIAGNOSIS — M48062 Spinal stenosis, lumbar region with neurogenic claudication: Secondary | ICD-10-CM | POA: Diagnosis present

## 2022-03-05 DIAGNOSIS — I129 Hypertensive chronic kidney disease with stage 1 through stage 4 chronic kidney disease, or unspecified chronic kidney disease: Secondary | ICD-10-CM | POA: Diagnosis present

## 2022-03-05 DIAGNOSIS — E039 Hypothyroidism, unspecified: Secondary | ICD-10-CM | POA: Diagnosis present

## 2022-03-05 DIAGNOSIS — I700 Atherosclerosis of aorta: Secondary | ICD-10-CM | POA: Diagnosis present

## 2022-03-05 DIAGNOSIS — R8290 Unspecified abnormal findings in urine: Secondary | ICD-10-CM

## 2022-03-05 DIAGNOSIS — M1990 Unspecified osteoarthritis, unspecified site: Secondary | ICD-10-CM | POA: Diagnosis present

## 2022-03-05 DIAGNOSIS — I480 Paroxysmal atrial fibrillation: Secondary | ICD-10-CM | POA: Diagnosis present

## 2022-03-05 DIAGNOSIS — M109 Gout, unspecified: Secondary | ICD-10-CM | POA: Diagnosis present

## 2022-03-05 DIAGNOSIS — K760 Fatty (change of) liver, not elsewhere classified: Secondary | ICD-10-CM | POA: Diagnosis present

## 2022-03-05 DIAGNOSIS — Z794 Long term (current) use of insulin: Secondary | ICD-10-CM | POA: Diagnosis not present

## 2022-03-05 DIAGNOSIS — E860 Dehydration: Secondary | ICD-10-CM | POA: Diagnosis present

## 2022-03-05 DIAGNOSIS — I959 Hypotension, unspecified: Secondary | ICD-10-CM | POA: Diagnosis present

## 2022-03-05 DIAGNOSIS — E1165 Type 2 diabetes mellitus with hyperglycemia: Secondary | ICD-10-CM | POA: Diagnosis not present

## 2022-03-05 DIAGNOSIS — I2510 Atherosclerotic heart disease of native coronary artery without angina pectoris: Secondary | ICD-10-CM | POA: Diagnosis present

## 2022-03-05 DIAGNOSIS — N3946 Mixed incontinence: Secondary | ICD-10-CM | POA: Diagnosis present

## 2022-03-05 LAB — GLUCOSE, CAPILLARY
Glucose-Capillary: 444 mg/dL — ABNORMAL HIGH (ref 70–99)
Glucose-Capillary: 340 mg/dL — ABNORMAL HIGH (ref 70–99)
Glucose-Capillary: 107 mg/dL — ABNORMAL HIGH (ref 70–99)
Glucose-Capillary: 151 mg/dL — ABNORMAL HIGH (ref 70–99)
Glucose-Capillary: 280 mg/dL — ABNORMAL HIGH (ref 70–99)
Glucose-Capillary: 355 mg/dL — ABNORMAL HIGH (ref 70–99)
Glucose-Capillary: 360 mg/dL — ABNORMAL HIGH (ref 70–99)

## 2022-03-05 LAB — CBC
HCT: 32.6 % — ABNORMAL LOW (ref 36.0–46.0)
Hemoglobin: 10.7 g/dL — ABNORMAL LOW (ref 12.0–15.0)
MCH: 31.6 pg (ref 26.0–34.0)
MCHC: 32.8 g/dL (ref 30.0–36.0)
MCV: 96.2 fL (ref 80.0–100.0)
Platelets: 115 10*3/uL — ABNORMAL LOW (ref 150–400)
RBC: 3.39 MIL/uL — ABNORMAL LOW (ref 3.87–5.11)
RDW: 13.2 % (ref 11.5–15.5)
WBC: 3.2 10*3/uL — ABNORMAL LOW (ref 4.0–10.5)
nRBC: 0 % (ref 0.0–0.2)

## 2022-03-05 LAB — BASIC METABOLIC PANEL
Anion gap: 5 (ref 5–15)
BUN: 25 mg/dL — ABNORMAL HIGH (ref 8–23)
CO2: 26 mmol/L (ref 22–32)
Calcium: 8.3 mg/dL — ABNORMAL LOW (ref 8.9–10.3)
Chloride: 102 mmol/L (ref 98–111)
Creatinine, Ser: 1.44 mg/dL — ABNORMAL HIGH (ref 0.44–1.00)
GFR, Estimated: 37 mL/min — ABNORMAL LOW (ref >60)
Glucose, Bld: 402 mg/dL — ABNORMAL HIGH (ref 70–99)
Potassium: 4.5 mmol/L (ref 3.5–5.1)
Sodium: 133 mmol/L — ABNORMAL LOW (ref 135–145)

## 2022-03-05 LAB — TSH: TSH: 2.183 u[IU]/mL (ref 0.350–4.500)

## 2022-03-05 MED ORDER — INSULIN ASPART 100 UNIT/ML IJ SOLN
3.0000 [IU] | Freq: Three times a day (TID) | INTRAMUSCULAR | Status: DC
  Administered 2022-03-05 – 2022-03-06 (×4): 3 [IU] via SUBCUTANEOUS
  Filled 2022-03-05 (×4): qty 1

## 2022-03-05 MED ORDER — INSULIN GLARGINE-YFGN 100 UNIT/ML ~~LOC~~ SOLN
14.0000 [IU] | Freq: Every day | SUBCUTANEOUS | Status: DC
  Administered 2022-03-06: 14 [IU] via SUBCUTANEOUS
  Filled 2022-03-05 (×2): qty 0.14

## 2022-03-05 MED ORDER — NITROFURANTOIN MONOHYD MACRO 100 MG PO CAPS
100.0000 mg | ORAL_CAPSULE | Freq: Two times a day (BID) | ORAL | Status: DC
Start: 2022-03-05 — End: 2022-03-05

## 2022-03-05 MED ORDER — INSULIN ASPART 100 UNIT/ML IJ SOLN
0.0000 [IU] | Freq: Three times a day (TID) | INTRAMUSCULAR | Status: DC
  Administered 2022-03-05: 5 [IU] via SUBCUTANEOUS
  Administered 2022-03-06: 3 [IU] via SUBCUTANEOUS
  Administered 2022-03-06: 9 [IU] via SUBCUTANEOUS
  Administered 2022-03-06: 3 [IU] via SUBCUTANEOUS
  Filled 2022-03-05 (×4): qty 1

## 2022-03-05 MED ORDER — ONDANSETRON HCL 4 MG/2ML IJ SOLN
4.0000 mg | Freq: Four times a day (QID) | INTRAMUSCULAR | Status: DC | PRN
  Administered 2022-03-05: 4 mg via INTRAVENOUS
  Filled 2022-03-05: qty 2

## 2022-03-05 NOTE — Assessment & Plan Note (Addendum)
No sepsis/SIRS criteria. No symptoms on admission. Care Everywhere review, UCx 01/06/22 (+)Staphylococcus epidermidis (S) to nitrofurantoin, TMP/SMX. Multiple allergies/intolerances  Culture pending -given lack of symptoms, will not treat at this time for likely asymptomatic bacteriuria

## 2022-03-06 DIAGNOSIS — Z794 Long term (current) use of insulin: Secondary | ICD-10-CM | POA: Diagnosis not present

## 2022-03-06 DIAGNOSIS — E11649 Type 2 diabetes mellitus with hypoglycemia without coma: Secondary | ICD-10-CM | POA: Diagnosis not present

## 2022-03-06 LAB — CBC
HCT: 35.6 % — ABNORMAL LOW (ref 36.0–46.0)
Hemoglobin: 11.8 g/dL — ABNORMAL LOW (ref 12.0–15.0)
MCH: 32.1 pg (ref 26.0–34.0)
MCHC: 33.1 g/dL (ref 30.0–36.0)
MCV: 96.7 fL (ref 80.0–100.0)
Platelets: 131 10*3/uL — ABNORMAL LOW (ref 150–400)
RBC: 3.68 MIL/uL — ABNORMAL LOW (ref 3.87–5.11)
RDW: 12.8 % (ref 11.5–15.5)
WBC: 3.5 10*3/uL — ABNORMAL LOW (ref 4.0–10.5)
nRBC: 0 % (ref 0.0–0.2)

## 2022-03-06 LAB — BASIC METABOLIC PANEL
Anion gap: 7 (ref 5–15)
BUN: 23 mg/dL (ref 8–23)
CO2: 22 mmol/L (ref 22–32)
Calcium: 8.9 mg/dL (ref 8.9–10.3)
Chloride: 103 mmol/L (ref 98–111)
Creatinine, Ser: 1.4 mg/dL — ABNORMAL HIGH (ref 0.44–1.00)
GFR, Estimated: 38 mL/min — ABNORMAL LOW (ref >60)
Glucose, Bld: 356 mg/dL — ABNORMAL HIGH (ref 70–99)
Potassium: 4.8 mmol/L (ref 3.5–5.1)
Sodium: 132 mmol/L — ABNORMAL LOW (ref 135–145)

## 2022-03-06 LAB — GLUCOSE, CAPILLARY
Glucose-Capillary: 342 mg/dL — ABNORMAL HIGH (ref 70–99)
Glucose-Capillary: 375 mg/dL — ABNORMAL HIGH (ref 70–99)
Glucose-Capillary: 370 mg/dL — ABNORMAL HIGH (ref 70–99)
Glucose-Capillary: 239 mg/dL — ABNORMAL HIGH (ref 70–99)
Glucose-Capillary: 139 mg/dL — ABNORMAL HIGH (ref 70–99)

## 2022-03-06 MED ORDER — APIXABAN 2.5 MG PO TABS
2.5000 mg | ORAL_TABLET | Freq: Two times a day (BID) | ORAL | 0 refills | Status: AC
Start: 2022-03-06 — End: ?

## 2022-03-06 MED ORDER — INSULIN ASPART 100 UNIT/ML IJ SOLN: INTRAMUSCULAR | 0 refills | Status: DC

## 2022-03-06 MED ORDER — GABAPENTIN 600 MG PO TABS: 600.0000 mg | ORAL_TABLET | Freq: Three times a day (TID) | ORAL | Status: DC

## 2022-03-06 MED ORDER — INSULIN GLARGINE-YFGN 100 UNIT/ML ~~LOC~~ SOLN: 14.0000 [IU] | Freq: Every day | SUBCUTANEOUS | 0 refills | Status: DC

## 2022-03-06 NOTE — Progress Notes (Signed)
DISCHARGE NOTE:  Patient given discharge instructions, verbalized understanding. Patient wheeled out by staff. Son provided transportation.

## 2022-03-06 NOTE — Progress Notes (Signed)
Brief note, pre-rounds:  Low sodium, likely dehydration due to diarrhea.  A bit lower from yesterday, can liberalize salt intake.  Sugars continue to be significantly elevated, no concern for DKA/HHNK.  If glucose can remain relatively stable and no hypOglycemia, even if she is experiencing hypERglycemia, hopefully will be able to discharge this afternoon with close outpatient follow-up on new insulin regimen.

## 2022-03-07 LAB — URINE CULTURE

## 2022-03-07 LAB — HEMOGLOBIN A1C
Hgb A1c MFr Bld: 9.5 % — ABNORMAL HIGH (ref 4.8–5.6)
Mean Plasma Glucose: 226 mg/dL

## 2022-03-08 DIAGNOSIS — N183 Chronic kidney disease, stage 3 unspecified: Secondary | ICD-10-CM | POA: Diagnosis not present

## 2022-03-08 DIAGNOSIS — E1122 Type 2 diabetes mellitus with diabetic chronic kidney disease: Secondary | ICD-10-CM | POA: Diagnosis not present

## 2022-03-08 DIAGNOSIS — Z794 Long term (current) use of insulin: Secondary | ICD-10-CM | POA: Diagnosis not present

## 2022-03-08 DIAGNOSIS — M79605 Pain in left leg: Secondary | ICD-10-CM | POA: Diagnosis not present

## 2022-03-10 ENCOUNTER — Ambulatory Visit
Admission: RE | Admit: 2022-03-10 | Discharge: 2022-03-10 | Disposition: A | Discharge status: Home / Self Care | Payer: Medicare Other | Source: Ambulatory Visit | Attending: Nephrology | Admitting: Nephrology

## 2022-03-10 DIAGNOSIS — N189 Chronic kidney disease, unspecified: Secondary | ICD-10-CM | POA: Insufficient documentation

## 2022-03-10 DIAGNOSIS — N184 Chronic kidney disease, stage 4 (severe): Secondary | ICD-10-CM | POA: Diagnosis not present

## 2022-03-10 DIAGNOSIS — I10 Essential (primary) hypertension: Secondary | ICD-10-CM | POA: Insufficient documentation

## 2022-03-10 DIAGNOSIS — N2581 Secondary hyperparathyroidism of renal origin: Secondary | ICD-10-CM | POA: Diagnosis not present

## 2022-03-10 DIAGNOSIS — M054 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: Secondary | ICD-10-CM | POA: Diagnosis not present

## 2022-03-10 DIAGNOSIS — D631 Anemia in chronic kidney disease: Secondary | ICD-10-CM | POA: Diagnosis not present

## 2022-03-21 ENCOUNTER — Encounter: Payer: Self-pay | Admitting: Osteopathic Medicine

## 2022-03-22 DIAGNOSIS — I480 Paroxysmal atrial fibrillation: Secondary | ICD-10-CM | POA: Diagnosis not present

## 2022-03-22 DIAGNOSIS — R600 Localized edema: Secondary | ICD-10-CM | POA: Diagnosis not present

## 2022-03-22 DIAGNOSIS — I509 Heart failure, unspecified: Secondary | ICD-10-CM | POA: Diagnosis not present

## 2022-03-22 DIAGNOSIS — R0602 Shortness of breath: Secondary | ICD-10-CM | POA: Diagnosis not present

## 2022-03-25 ENCOUNTER — Telehealth: Payer: Self-pay | Admitting: Internal Medicine

## 2022-03-25 NOTE — Telephone Encounter (Signed)
Pt c/o swelling: STAT is pt has developed SOB within 24 hours  If swelling, where is the swelling located? BOTH FEET  How much weight have you gained and in what time span? YES  25 pounds  Have you gained 3 pounds in a day or 5 pounds in a week? YES  Do you have a log of your daily weights (if so, list)? 164 ,this week on Tues 189  Are you currently taking a fluid pill? yes  Are you currently SOB? yes  Have you traveled recently? No Patient states that her PCP was supposed to contact us with a referral.

## 2022-03-25 NOTE — Telephone Encounter (Signed)
I called and spoke with the patient. She advised that on Monday, she had a significant amount of lower extremity swelling. Left > right, to the point she was walking on her toes and almost dragging her leg leg due to fluid.   She saw her PCP on Tuesday and they have referred her to an orthopedic MD as they felt some of her issues may be coming from her knee.  She had a chest x-ray done that showed that her lungs are stable.   She was advised to increase her lasix from 20 mg once daily to 40 mg BID through Saturday. She was also prescribed potassium to take twice a day, but she is unsure of the dose.  She was advised by her PCP to follow up with her renal doctor as planned- she is scheduled to be seen by nephrology on Thursday next week to go over the results of a bladder/ kidney ultrasound.   Per Enid Derry, she did try to stay off of her feet on Wednesday with some improvement in swelling.  She is having some swelling today and advised her PCP wanted her to follow up with [name].  I advised the patient that since she is not in acute distress I can add her on to see [name] on Tuesday 03/29/22 at 8:00 am, which she advised will work fine for her.  She is trying to care for her husband who is currently in a facility with dementia as well and this is taking a toll on her.   The patient was very appreciative of the call today.

## 2022-03-29 ENCOUNTER — Ambulatory Visit (INDEPENDENT_AMBULATORY_CARE_PROVIDER_SITE_OTHER): Payer: Medicare Other | Admitting: Internal Medicine

## 2022-03-29 VITALS — BP 110/58 | HR 66 | Ht 62.0 in | Wt 171.2 lb

## 2022-03-29 DIAGNOSIS — L851 Acquired keratosis [keratoderma] palmaris et plantaris: Secondary | ICD-10-CM | POA: Diagnosis not present

## 2022-03-29 DIAGNOSIS — I480 Paroxysmal atrial fibrillation: Secondary | ICD-10-CM

## 2022-03-29 DIAGNOSIS — I495 Sick sinus syndrome: Secondary | ICD-10-CM

## 2022-03-29 DIAGNOSIS — Z950 Presence of cardiac pacemaker: Secondary | ICD-10-CM | POA: Diagnosis not present

## 2022-03-29 DIAGNOSIS — I5032 Chronic diastolic (congestive) heart failure: Secondary | ICD-10-CM

## 2022-03-29 DIAGNOSIS — Z79899 Other long term (current) drug therapy: Secondary | ICD-10-CM

## 2022-03-29 DIAGNOSIS — E1142 Type 2 diabetes mellitus with diabetic polyneuropathy: Secondary | ICD-10-CM | POA: Diagnosis not present

## 2022-03-29 DIAGNOSIS — B351 Tinea unguium: Secondary | ICD-10-CM | POA: Diagnosis not present

## 2022-03-29 NOTE — Progress Notes (Signed)
Patient Care Team: [name], MD as PCP - General (Internal Medicine) Deboraha Sprang, MD as PCP - Cardiology (Cardiology) [name], MD (Internal Medicine) Bary Castilla Forest Gleason, MD (General Surgery)   HPI  Deborah Obrien is a 81 y.o. female Seen as an addon for progressive edema.  Hx of pacemaker Medtronic  for sinus node dysfunction single chamber pacemaker for which she underwent dual chamber upgrade 12/18 cx by encircling of the chronic RV lead by the atrial lead resulting in tension for which she underwent RA lead revision.   She has hx of atrial arrhythmias, with atrial fib and flutter, typical/atypical all documented.  on amiodarone    Patient denies symptoms of respiratory, GI intolerance, sun sensitivity, neurological symptoms attributable to amiodarone.        High resolution CT scan 7/21 >> nonspecific groundglass and reticular densities.  Possibly related to scarring cannot exclude interstitial lung disease.  Unchanged 6/22.     The patient denies chest pain   nocturnal dyspnea, orthopnea   There have been no palpitations, lightheadedness or syncope.  Complains of peripheral edema improved with primary care up titration of her furosemide from 20 daily--40 twice daily.  Weight is still up.  Life remains difficult with the progressive dementia of her husband; he is now in a care facility.      Date Test EF   11/16  Echo   55-60   12/17 Echo   45% Stress neg  12/17 Cath  Nonobstructive CAD  Mr 2+  2/19 Myoview  55-65% No ischemia  7/19 Echo    60-65% Mild RVE      Date Cr K Hgb TSH LFTs  10/18 1.72  13.5    12/18 1.32  13.5    6/19 1.57 5.5>>4.0 13.7 2.95 71(5/19)  8/19 1.53 3.8 11.8>>12.7  14  10/19   12.2 (11/19)  20  1/20 1.94 4.3 11.5 10.67 21  5/20 1.6 4.8  4.135 50  1/21 1.6<<2.1 4.7 12.1 (10/20) 7.133 33  4/21 1.66 4.9 11.7 4.1 (CE) 5/21 18  11/21 1.9 4.3 12.2  28 (8/21)  12/22 1.5 4.8 10.2 (10/22) FeSat 7%  40  6/23 1.4  4.8 11.8 2.18 29    Thromboembolic risk factors ( age  -2, HTN-1, TIA/CVA-2 , DM-1, Vasc disease -1, Gender-1) for a CHADSVASc Score of >=8   Past Medical History:  Diagnosis Date   A-fib ([place]) 2016   Acid reflux    Anemia    Anxiety    Bowel obstruction (HCC)    Carotid artery disease ([place])    Chronic kidney disease (CKD) stage G3a/A1, moderately decreased glomerular filtration rate (GFR) between 45-59 mL/min/1.73 square meter and albuminuria creatinine ratio less than 30 mg/g (HCC)    stage IV   Closed fracture of one rib of right side 05/29/2020   Complete tear of left rotator cuff 10/16/2014   Coronary artery disease    Depression    Diabetes mellitus without complication ([place])    Dyspnea    Dysrhythmia    PAROXYSMAL ATRIAL FIBRILLATION   Fatty liver    GI bleed    Gout    Hypertension    Hypothyroidism    Low back pain    history of kyphoplasty t12-l1   Morbid obesity (HCC)    Multiple gastric polyps    history of gi bleed.  polyps removed   Neuromuscular disorder ([place])    diabetic neuropathy  Osteoarthritis    Osteoporosis    Paroxysmal atrial fibrillation (HCC)    Presence of permanent cardiac pacemaker    Sinus node dysfunction ([place]) 08/24/2017   Stroke ([place])    TIA, patient unaware that she had it. no residual   Tendinitis of upper biceps tendon of left shoulder 11/10/2019   TIA (transient ischemic attack)     Past Surgical History:  Procedure Laterality Date   [place]     2008. plate & screws    BACK SURGERY  2019   patient describes kyphoplasty for compression fractures, MD referral said fusion   BREAST BIOPSY Right 08/16   stereo fibroadenomatous change, neg for atypia   BREAST EXCISIONAL BIOPSY Left    neg   CARDIAC CATHETERIZATION Left 08/25/2016   Procedure: Left Heart Cath and Coronary Angiography;  Surgeon: Corey Skains, MD;  Location: [place] [facility];  Service: Cardiovascular;   Laterality: Left;   CARDIOVERSION N/A 06/28/2017   Procedure: CARDIOVERSION;  Surgeon: Corey Skains, MD;  Location: [facility] ORS;  Service: Cardiovascular;  Laterality: N/A;   CARDIOVERSION N/A 02/06/2018   Procedure: CARDIOVERSION;  Surgeon: Josue Hector, MD;  Location: [facility] ENDOSCOPY;  Service: Cardiovascular;  Laterality: N/A;   CARDIOVERSION N/A 07/15/2020   Procedure: CARDIOVERSION;  Surgeon: Jerline Pain, MD;  Location: [facility] ENDOSCOPY;  Service: Cardiovascular;  Laterality: N/A;   CATARACT EXTRACTION, BILATERAL  2012   CHOLECYSTECTOMY     colon blockage  [place]   removed 20% of colon. colon had collapsed   COLONOSCOPY  2016   polyps removed 2016   DORSAL COMPARTMENT RELEASE Left 09/14/2016   Procedure: RELEASE DORSAL COMPARTMENT (DEQUERVAIN);  Surgeon: Dereck Leep, MD;  Location: [facility] ORS;  Service: Orthopedics;  Laterality: Left;   ESOPHAGOGASTRODUODENOSCOPY N/A 01/27/2015   Procedure: ESOPHAGOGASTRODUODENOSCOPY (EGD);  Surgeon: Lollie Sails, MD;  Location: [facility]COPY;  Service: Endoscopy;  Laterality: N/A;   ESOPHAGOGASTRODUODENOSCOPY (EGD) WITH PROPOFOL N/A 07/24/2015   Procedure: ESOPHAGOGASTRODUODENOSCOPY (EGD) WITH PROPOFOL;  Surgeon: Lucilla Lame, MD;  Location: [facility] ENDOSCOPY;  Service: Endoscopy;  Laterality: N/A;   ESOPHAGOGASTRODUODENOSCOPY (EGD) WITH PROPOFOL N/A 04/12/2018   Procedure: ESOPHAGOGASTRODUODENOSCOPY (EGD) WITH PROPOFOL;  Surgeon: Jonathon Bellows, MD;  Location: [facility] ENDOSCOPY;  Service: Gastroenterology;  Laterality: N/A;   ESOPHAGOGASTRODUODENOSCOPY (EGD) WITH PROPOFOL N/A 06/22/2018   Procedure: ESOPHAGOGASTRODUODENOSCOPY (EGD) WITH PROPOFOL;  Surgeon: [name], MD;  Location: [facility] END[facility]COPY;  Service: Endoscopy;  Laterality: N/A;   ESOPHAGOGASTRODUODENOSCOPY (EGD) WITH PROPOFOL N/A 07/09/2018   Procedure: ESOPHAGOGASTRODUODENOSCOPY (EGD) WITH PROPOFOL with resection of gastric polyps;  Surgeon: Jonathon Bellows, MD;  Location: [facility]  ENDOSCOPY;  Service: Gastroenterology;  Laterality: N/A;   ESOPHAGOGASTRODUODENOSCOPY (EGD) WITH PROPOFOL N/A 05/17/2019   Procedure: ESOPHAGOGASTRODUODENOSCOPY (EGD) WITH PROPOFOL;  Surgeon: Jonathon Bellows, MD;  Location: [facility] ENDOSCOPY;  Service: Gastroenterology;  Laterality: N/A;   EUS N/A 06/22/2018   Procedure: UPPER ENDOSCOPIC ULTRASOUND (EUS) RADIAL;  Surgeon: [name], MD;  Location: [facility] ENDOSCOPY;  Service: Endoscopy;  Laterality: N/A;   EYE SURGERY Bilateral    cataract extractions   JOINT REPLACEMENT Bilateral 2014   Bilateral Knee replacement   LEAD REVISION/REPAIR N/A 08/29/2017   Procedure: LEAD REVISION/REPAIR;  Surgeon: Constance Haw, MD;  Location: [place] [facility];  Service: Cardiovascular;  Laterality: N/A;   OOPHORECTOMY     PACEMAKER INSERTION Left 07/01/2015   Procedure: INSERTION PACEMAKER;  Surgeon:  Isaias Cowman, MD;  Location: [facility] ORS;  Service: Cardiovascular;  Laterality: Left;   PACEMAKER REVISION N/A 08/28/2017   Procedure: PACEMAKER REVISION;  Surgeon: Deboraha Sprang, MD;  Location: [place] [facility];  Service: Cardiovascular;  Laterality: N/A;   REPLACEMENT TOTAL KNEE BILATERAL     SHOULDER ARTHROSCOPY WITH ROTATOR CUFF REPAIR AND OPEN BICEPS TENODESIS Left 11/21/2019   Procedure: LEFT SHOULDER ARTHROSCOPY WITH DEBRIDEMENT, DECOMPRESSION, ROTATOR CUFF REPAIR AND BICEPS TENOLYSIS;  Surgeon: Corky Mull, MD;  Location: [facility] ORS;  Service: Orthopedics;  Laterality: Left;   SHOULDER ARTHROSCOPY WITH SUBACROMIAL DECOMPRESSION Left 2013   TEE WITHOUT CARDIOVERSION N/A 06/28/2017   Procedure: TRANSESOPHAGEAL ECHOCARDIOGRAM (TEE);  Surgeon: Corey Skains, MD;  Location: [facility] ORS;  Service: Cardiovascular;  Laterality: N/A;    Current Outpatient Medications  Medication Sig Dispense Refill   allopurinol (ZYLOPRIM) 100 MG tablet Take 200 mg by mouth daily.     amiodarone (PACERONE) 200 MG tablet TAKE 1 TABLET BY MOUTH ONCE DAILY . APPOINTMENT  REQUIRED FOR FUTURE REFILLS 90 tablet 3   apixaban (ELIQUIS) 2.5 MG TABS tablet Take 1 tablet (2.5 mg total) by mouth 2 (two) times daily. 60 tablet 0   busPIRone (BUSPAR) 10 MG tablet Take 10 mg by mouth 2 (two) times daily.      cholecalciferol (VITAMIN D3) 10 MCG (400 UNIT) TABS tablet Take 400 Units by mouth in the morning and at bedtime.     docusate sodium (COLACE) 100 MG capsule Take 200 mg by mouth 2 (two) times daily.     DULoxetine (CYMBALTA) 30 MG capsule Take 30 mg by mouth daily.      furosemide (LASIX) 20 MG tablet Take 20 mg by mouth daily. In the morning     gabapentin (NEURONTIN) 300 MG capsule Take 1 capsule (300 mg) by mouth three times a day     insulin aspart (NOVOLOG) 100 UNIT/ML injection 0-12 Units, Subcutaneous, 3 times daily with meals Check your sugars before meal time and take the insulin as follows based on the sugar readings. CBG < 70: NO INSULIN: eat a snack and recheck sugar soon CBG 70 - 120: 3 units CBG 121 - 150: 4 unit CBG 151 - 200: 5 units CBG 201 - 250: 7 units CBG 251 - 300: 8 units CBG 301 - 350: 10 units CBG 351 - 400: 12 units CBG > 400: call MD 10 mL 0   insulin glargine-yfgn (SEMGLEE) 100 UNIT/ML injection Inject 0.14 mLs (14 Units total) into the skin daily. 10 mL 0   ketoconazole (NIZORAL) 2 % cream Apply 1 application topically daily as needed (rash/skin irritation.).      levothyroxine (SYNTHROID) 50 MCG tablet Take 50 mcg by mouth daily before breakfast.     omeprazole (PRILOSEC) 20 MG capsule TAKE 1 CAPSULE BY MOUTH TWICE DAILY BEFORE A MEAL 60 capsule 11   oxybutynin (DITROPAN) 5 MG tablet Take 10 mg by mouth 2 (two) times daily. Breakfast & bedtime     Polyethyl Glycol-Propyl Glycol 0.4-0.3 % SOLN Place 1-2 drops into both eyes 4 (four) times daily as needed (dry eyes).      pravastatin (PRAVACHOL) 20 MG tablet Take 20 mg by mouth at bedtime.   2   Prenat-FeCbn-FeBisg-FA-Omega (MULTIVITAMIN/MINERALS PO) Take 1 tablet by mouth daily.      talc powder Apply 1 application topically as needed (under the breast irritation).     traZODone (DESYREL) 50 MG tablet Take 50 mg by mouth at  bedtime.      vitamin B-12 (CYANOCOBALAMIN) 1000 MCG tablet Take 1,000 mcg by mouth daily.     HYDROcodone-acetaminophen (NORCO) 10-325 MG tablet Take 1 tablet by mouth every 4 (four) hours as needed for moderate pain. (Patient not taking: Reported on 03/04/2022) 50 tablet 0   traMADol (ULTRAM) 50 MG tablet Take 50 mg by mouth every 6 (six) hours as needed. (Patient not taking: Reported on 03/04/2022)     vitamin C (ASCORBIC ACID) 500 MG tablet Take 500 mg by mouth 2 (two) times daily. (Patient not taking: Reported on 03/29/2022)     No current facility-administered medications for this visit.    Allergies  Allergen Reactions   Fish Allergy Shortness Of Breath    All kinds of fish. Severe vomitting even with smell of fish.    Macrolides And Ketolides Other (See Comments)    Severe stomach pain (mycins)   Meperidine Shortness Of Breath, Nausea Only and Other (See Comments)    Stomach pain    Other Nausea Only, Swelling, Rash, Anaphylaxis, Diarrhea, Shortness Of Breath and Other (See Comments)    Allergen: NON-STEROIDS, bextra - hands and feet swell  Reaction:  Stomach pain   Other Reaction: Intolerance   Prednisone Anaphylaxis and Other (See Comments)    (facial swelling/redness/burning)Increases pts blood sugar  Pt states that she is allergic to all steroids.     Shellfish Allergy Anaphylaxis, Shortness Of Breath, Diarrhea, Nausea Only and Rash    Stomach pain    Sulfa Antibiotics Shortness Of Breath, Diarrhea, Nausea Only and Other (See Comments)    Reaction:  Stomach pain   Reaction:  Stomach pain  Other Reaction: Intolerance   Sulfacetamide Sodium Diarrhea, Nausea Only, Other (See Comments) and Shortness Of Breath    Reaction:  Stomach pain    Telbivudine Other (See Comments)    Unknown reaction   Uloric [Febuxostat] Anaphylaxis    Locks  pt's body up    Aspirin Rash and Other (See Comments)    Stomach pain (tolerates lower doses)   Celecoxib Other (See Comments)    GI bleed, weakness, and stomach pain.     Cephalexin Diarrhea, Nausea Only and Other (See Comments)    stomach pain    Codeine Diarrhea, Nausea Only and Other (See Comments)    Reaction:  Stomach pain Pt tolerates morphine    Dilaudid [Hydromorphone Hcl] Other (See Comments)    Reactions: easy to overdose - blood pressure drops really low   Erythromycin Diarrhea, Nausea Only and Other (See Comments)    Reaction:  Stomach pain    Iodinated Contrast Media Rash and Other (See Comments)    Pt states that she is unable to have because she has chronic kidney disease.   CKD Pt states that she is unable to have because she has chronic kidney disease.   CKD    Lipitor [Atorvastatin Calcium] Nausea Only    Reaction: nausea, weakness, pass blood   Oxycodone Other (See Comments)    Reaction:  Stomach pain    Aleve [Naproxen]     Reaction: severe stomach pain   Atorvastatin Nausea Only and Other (See Comments)    Weakness    Cephalosporins Other (See Comments)    Reaction:  Unknown    Doxycycline     Stomach pain    Iodine Rash   Motrin [Ibuprofen]     Reaction: severe stomach pain   Tape Other (See Comments)    Causes pts skin to tear  Pt states that she is able to use paper tape.       Valdecoxib Swelling and Other (See Comments)    Pt states that her hands and feet swell.        Review of Systems negative except from HPI and PMH  Physical Exam   BP (!) 110/58 (BP Location: Left Arm, Patient Position: Sitting, Cuff Size: Normal)   Pulse 66   Ht 5\' 2"  (1.575 m)   Wt 171 lb 4 oz (77.7 kg)   BMI 31.32 kg/m  Well developed and well nourished in no acute distress HENT normal Neck supple with JVP-flat Clear kyphosis Device pocket well healed; without hematoma or erythema.  There is no tethering  Regular rate and rhythm, no  murmur Abd-soft with  active BS No Clubbing cyanosis tr edema Skin-warm and dry A & Oriented  Grossly normal sensory and motor function  ECG a pacing @ 61 24/09/45   Assessment and  Plan  Sinus node dysfunction    Medtronic pacemaker upgraded to dual chamber    Paroxysmal atrial fibrillation/flutter with a rapid ventricular response  High Risk Medication Surveillance--Amiodarone   Cardiomyopathy-interval normalization   Diabetes mellitus with nephropathy  HFpEF   RV enlargement   Dyspnea Cough question amiodarone lung injury serial CTs stable  Anemia Fe def   No  interval afib  continue anticoagulation with Apixaban  dosed for age and renal function; continue amiodarone 200 daily  Volume status is better but weight not at baseline.,  will increase furosemide from 20--40 daily.  We will need to have renal input she is to see them on Thursday of this week  Still anemic with low Fe sats, recheck with PCP for iron supplementation  Device function normal  see data in pace art

## 2022-03-29 NOTE — Patient Instructions (Signed)
Medication Instructions:  - Your physician has recommended you make the following change in your medication:   1) INCREASE lasix (furosemide) 20 mg: - take 2 tablet (40 mg) by mouth once daily   *If you need a refill on your cardiac medications before your next appointment, please call your pharmacy*   Lab Work: - to be followed by your kidney doctor- BMP  If you have labs (blood work) drawn today and your tests are completely normal, you will receive your results only by: [place] (if you have MyChart) OR A paper copy in the mail If you have any lab test that is abnormal or we need to change your treatment, we will call you to review the results.   Testing/Procedures: - none ordered   Follow-Up: At [facility], you and your health needs are our priority.  As part of our continuing mission to provide you with exceptional heart care, we have created designated Provider Care Teams.  These Care Teams include your primary Cardiologist (physician) and Advanced Practice Providers (APPs -  Physician Assistants and Nurse Practitioners) who all work together to provide you with the care you need, when you need it.  We recommend signing up for the patient portal called "MyChart".  Sign up information is provided on this After Visit Summary.  MyChart is used to connect with patients for Virtual Visits (Telemedicine).  Patients are able to view lab/test results, encounter notes, upcoming appointments, etc.  Non-urgent messages can be sent to your provider as well.   To learn more about what you can do with MyChart, go to [email or web address].    Your next appointment:   6 month(s)  The format for your next appointment:   In Person  Provider:   [name], MD    Other Instructions N/a  Important Information About Sugar

## 2022-03-31 DIAGNOSIS — E1122 Type 2 diabetes mellitus with diabetic chronic kidney disease: Secondary | ICD-10-CM | POA: Diagnosis not present

## 2022-03-31 DIAGNOSIS — N2581 Secondary hyperparathyroidism of renal origin: Secondary | ICD-10-CM | POA: Diagnosis not present

## 2022-03-31 DIAGNOSIS — I779 Disorder of arteries and arterioles, unspecified: Secondary | ICD-10-CM | POA: Diagnosis not present

## 2022-03-31 DIAGNOSIS — R809 Proteinuria, unspecified: Secondary | ICD-10-CM | POA: Diagnosis not present

## 2022-03-31 DIAGNOSIS — N183 Chronic kidney disease, stage 3 unspecified: Secondary | ICD-10-CM | POA: Diagnosis not present

## 2022-03-31 DIAGNOSIS — I480 Paroxysmal atrial fibrillation: Secondary | ICD-10-CM | POA: Diagnosis not present

## 2022-03-31 DIAGNOSIS — N184 Chronic kidney disease, stage 4 (severe): Secondary | ICD-10-CM | POA: Diagnosis not present

## 2022-03-31 DIAGNOSIS — E785 Hyperlipidemia, unspecified: Secondary | ICD-10-CM | POA: Diagnosis not present

## 2022-04-08 DIAGNOSIS — R29898 Other symptoms and signs involving the musculoskeletal system: Secondary | ICD-10-CM | POA: Diagnosis not present

## 2022-04-08 DIAGNOSIS — M4326 Fusion of spine, lumbar region: Secondary | ICD-10-CM | POA: Diagnosis not present

## 2022-04-08 DIAGNOSIS — M79605 Pain in left leg: Secondary | ICD-10-CM | POA: Diagnosis not present

## 2022-04-08 DIAGNOSIS — Z96652 Presence of left artificial knee joint: Secondary | ICD-10-CM | POA: Diagnosis not present

## 2022-04-11 ENCOUNTER — Other Ambulatory Visit (HOSPITAL_BASED_OUTPATIENT_CLINIC_OR_DEPARTMENT_OTHER): Payer: Self-pay | Admitting: Osteopathic Medicine

## 2022-04-14 ENCOUNTER — Other Ambulatory Visit (HOSPITAL_BASED_OUTPATIENT_CLINIC_OR_DEPARTMENT_OTHER): Payer: Self-pay | Admitting: Osteopathic Medicine

## 2022-04-18 DIAGNOSIS — T8484XD Pain due to internal orthopedic prosthetic devices, implants and grafts, subsequent encounter: Secondary | ICD-10-CM | POA: Diagnosis not present

## 2022-04-18 DIAGNOSIS — M65312 Trigger thumb, left thumb: Secondary | ICD-10-CM | POA: Diagnosis not present

## 2022-04-18 DIAGNOSIS — F410 Panic disorder [episodic paroxysmal anxiety] without agoraphobia: Secondary | ICD-10-CM | POA: Diagnosis not present

## 2022-04-18 DIAGNOSIS — J84112 Idiopathic pulmonary fibrosis: Secondary | ICD-10-CM | POA: Diagnosis not present

## 2022-04-18 DIAGNOSIS — M419 Scoliosis, unspecified: Secondary | ICD-10-CM | POA: Diagnosis not present

## 2022-04-18 DIAGNOSIS — M4326 Fusion of spine, lumbar region: Secondary | ICD-10-CM | POA: Diagnosis not present

## 2022-04-18 DIAGNOSIS — M1990 Unspecified osteoarthritis, unspecified site: Secondary | ICD-10-CM | POA: Diagnosis not present

## 2022-04-18 DIAGNOSIS — E1122 Type 2 diabetes mellitus with diabetic chronic kidney disease: Secondary | ICD-10-CM | POA: Diagnosis not present

## 2022-04-18 DIAGNOSIS — M5136 Other intervertebral disc degeneration, lumbar region: Secondary | ICD-10-CM | POA: Diagnosis not present

## 2022-04-18 DIAGNOSIS — I4891 Unspecified atrial fibrillation: Secondary | ICD-10-CM | POA: Diagnosis not present

## 2022-04-18 DIAGNOSIS — M810 Age-related osteoporosis without current pathological fracture: Secondary | ICD-10-CM | POA: Diagnosis not present

## 2022-04-18 DIAGNOSIS — I129 Hypertensive chronic kidney disease with stage 1 through stage 4 chronic kidney disease, or unspecified chronic kidney disease: Secondary | ICD-10-CM | POA: Diagnosis not present

## 2022-04-18 DIAGNOSIS — M5030 Other cervical disc degeneration, unspecified cervical region: Secondary | ICD-10-CM | POA: Diagnosis not present

## 2022-04-18 DIAGNOSIS — M109 Gout, unspecified: Secondary | ICD-10-CM | POA: Diagnosis not present

## 2022-04-18 DIAGNOSIS — N189 Chronic kidney disease, unspecified: Secondary | ICD-10-CM | POA: Diagnosis not present

## 2022-04-18 DIAGNOSIS — J309 Allergic rhinitis, unspecified: Secondary | ICD-10-CM | POA: Diagnosis not present

## 2022-05-17 DIAGNOSIS — N183 Chronic kidney disease, stage 3 unspecified: Secondary | ICD-10-CM | POA: Diagnosis not present

## 2022-05-17 DIAGNOSIS — E1122 Type 2 diabetes mellitus with diabetic chronic kidney disease: Secondary | ICD-10-CM | POA: Diagnosis not present

## 2022-05-17 DIAGNOSIS — E1140 Type 2 diabetes mellitus with diabetic neuropathy, unspecified: Secondary | ICD-10-CM | POA: Diagnosis not present

## 2022-05-17 DIAGNOSIS — I779 Disorder of arteries and arterioles, unspecified: Secondary | ICD-10-CM | POA: Diagnosis not present

## 2022-05-18 DIAGNOSIS — M5030 Other cervical disc degeneration, unspecified cervical region: Secondary | ICD-10-CM | POA: Diagnosis not present

## 2022-05-18 DIAGNOSIS — M4326 Fusion of spine, lumbar region: Secondary | ICD-10-CM | POA: Diagnosis not present

## 2022-05-18 DIAGNOSIS — J309 Allergic rhinitis, unspecified: Secondary | ICD-10-CM | POA: Diagnosis not present

## 2022-05-18 DIAGNOSIS — T8484XD Pain due to internal orthopedic prosthetic devices, implants and grafts, subsequent encounter: Secondary | ICD-10-CM | POA: Diagnosis not present

## 2022-05-18 DIAGNOSIS — I4891 Unspecified atrial fibrillation: Secondary | ICD-10-CM | POA: Diagnosis not present

## 2022-05-18 DIAGNOSIS — M5136 Other intervertebral disc degeneration, lumbar region: Secondary | ICD-10-CM | POA: Diagnosis not present

## 2022-05-18 DIAGNOSIS — M65312 Trigger thumb, left thumb: Secondary | ICD-10-CM | POA: Diagnosis not present

## 2022-05-18 DIAGNOSIS — I129 Hypertensive chronic kidney disease with stage 1 through stage 4 chronic kidney disease, or unspecified chronic kidney disease: Secondary | ICD-10-CM | POA: Diagnosis not present

## 2022-05-18 DIAGNOSIS — N189 Chronic kidney disease, unspecified: Secondary | ICD-10-CM | POA: Diagnosis not present

## 2022-05-18 DIAGNOSIS — M810 Age-related osteoporosis without current pathological fracture: Secondary | ICD-10-CM | POA: Diagnosis not present

## 2022-05-18 DIAGNOSIS — F410 Panic disorder [episodic paroxysmal anxiety] without agoraphobia: Secondary | ICD-10-CM | POA: Diagnosis not present

## 2022-05-18 DIAGNOSIS — M1990 Unspecified osteoarthritis, unspecified site: Secondary | ICD-10-CM | POA: Diagnosis not present

## 2022-05-18 DIAGNOSIS — M419 Scoliosis, unspecified: Secondary | ICD-10-CM | POA: Diagnosis not present

## 2022-05-18 DIAGNOSIS — J84112 Idiopathic pulmonary fibrosis: Secondary | ICD-10-CM | POA: Diagnosis not present

## 2022-05-18 DIAGNOSIS — M109 Gout, unspecified: Secondary | ICD-10-CM | POA: Diagnosis not present

## 2022-05-18 DIAGNOSIS — E1122 Type 2 diabetes mellitus with diabetic chronic kidney disease: Secondary | ICD-10-CM | POA: Diagnosis not present

## 2022-05-23 DIAGNOSIS — Z0000 Encounter for general adult medical examination without abnormal findings: Secondary | ICD-10-CM | POA: Diagnosis not present

## 2022-05-24 ENCOUNTER — Ambulatory Visit (INDEPENDENT_AMBULATORY_CARE_PROVIDER_SITE_OTHER): Payer: Medicare Other

## 2022-05-24 DIAGNOSIS — I442 Atrioventricular block, complete: Secondary | ICD-10-CM | POA: Diagnosis not present

## 2022-05-27 LAB — CUP PACEART REMOTE DEVICE CHECK
Battery Remaining Longevity: 95 mo
Battery Voltage: 2.92 V
Brady Statistic AP VP Percent: 0.04 %
Brady Statistic AP VS Percent: 98.54 %
Brady Statistic AS VP Percent: 0 %
Brady Statistic AS VS Percent: 1.42 %
Brady Statistic RA Percent Paced: 98.74 %
Brady Statistic RV Percent Paced: 0.04 %
Date Time Interrogation Session: 20230912014224
Implantable Lead Implant Date: 20161019
Implantable Lead Implant Date: 20181217
Implantable Lead Location: 753859
Implantable Lead Location: 753860
Implantable Lead Model: 5076
Implantable Lead Model: 5076
Implantable Pulse Generator Implant Date: 20181217
Lead Channel Impedance Value: 304 Ohm
Lead Channel Impedance Value: 361 Ohm
Lead Channel Impedance Value: 380 Ohm
Lead Channel Impedance Value: 437 Ohm
Lead Channel Pacing Threshold Amplitude: 0.75 V
Lead Channel Pacing Threshold Amplitude: 0.875 V
Lead Channel Pacing Threshold Pulse Width: 0.4 ms
Lead Channel Pacing Threshold Pulse Width: 0.4 ms
Lead Channel Sensing Intrinsic Amplitude: 3.75 mV
Lead Channel Sensing Intrinsic Amplitude: 3.75 mV
Lead Channel Sensing Intrinsic Amplitude: 5.125 mV
Lead Channel Sensing Intrinsic Amplitude: 5.125 mV
Lead Channel Setting Pacing Amplitude: 2 V
Lead Channel Setting Pacing Amplitude: 2.5 V
Lead Channel Setting Pacing Pulse Width: 0.4 ms
Lead Channel Setting Sensing Sensitivity: 1.2 mV

## 2022-05-29 ENCOUNTER — Emergency Department: Payer: Medicare Other

## 2022-05-29 ENCOUNTER — Encounter: Payer: Self-pay | Admitting: Internal Medicine

## 2022-05-29 ENCOUNTER — Inpatient Hospital Stay
Admission: EM | Admit: 2022-05-29 | Discharge: 2022-06-08 | DRG: 493 | Disposition: A | Discharge status: Skilled Nursing Facility | Payer: Medicare Other | Attending: Internal Medicine | Admitting: Internal Medicine

## 2022-05-29 DIAGNOSIS — E039 Hypothyroidism, unspecified: Secondary | ICD-10-CM | POA: Diagnosis present

## 2022-05-29 DIAGNOSIS — N179 Acute kidney failure, unspecified: Secondary | ICD-10-CM | POA: Diagnosis present

## 2022-05-29 DIAGNOSIS — W010XXA Fall on same level from slipping, tripping and stumbling without subsequent striking against object, initial encounter: Secondary | ICD-10-CM | POA: Diagnosis present

## 2022-05-29 DIAGNOSIS — Z20822 Contact with and (suspected) exposure to covid-19: Secondary | ICD-10-CM | POA: Diagnosis present

## 2022-05-29 DIAGNOSIS — S42332A Displaced oblique fracture of shaft of humerus, left arm, initial encounter for closed fracture: Secondary | ICD-10-CM | POA: Diagnosis not present

## 2022-05-29 DIAGNOSIS — I4821 Permanent atrial fibrillation: Secondary | ICD-10-CM | POA: Diagnosis not present

## 2022-05-29 DIAGNOSIS — E1122 Type 2 diabetes mellitus with diabetic chronic kidney disease: Secondary | ICD-10-CM | POA: Diagnosis not present

## 2022-05-29 DIAGNOSIS — M109 Gout, unspecified: Secondary | ICD-10-CM | POA: Diagnosis present

## 2022-05-29 DIAGNOSIS — E1149 Type 2 diabetes mellitus with other diabetic neurological complication: Secondary | ICD-10-CM | POA: Diagnosis present

## 2022-05-29 DIAGNOSIS — Z882 Allergy status to sulfonamides status: Secondary | ICD-10-CM | POA: Diagnosis not present

## 2022-05-29 DIAGNOSIS — Z91013 Allergy to seafood: Secondary | ICD-10-CM

## 2022-05-29 DIAGNOSIS — N189 Chronic kidney disease, unspecified: Secondary | ICD-10-CM | POA: Diagnosis not present

## 2022-05-29 DIAGNOSIS — Z950 Presence of cardiac pacemaker: Secondary | ICD-10-CM

## 2022-05-29 DIAGNOSIS — K760 Fatty (change of) liver, not elsewhere classified: Secondary | ICD-10-CM | POA: Diagnosis not present

## 2022-05-29 DIAGNOSIS — S42309A Unspecified fracture of shaft of humerus, unspecified arm, initial encounter for closed fracture: Secondary | ICD-10-CM | POA: Diagnosis present

## 2022-05-29 DIAGNOSIS — M1990 Unspecified osteoarthritis, unspecified site: Secondary | ICD-10-CM | POA: Diagnosis present

## 2022-05-29 DIAGNOSIS — G2581 Restless legs syndrome: Secondary | ICD-10-CM

## 2022-05-29 DIAGNOSIS — R710 Precipitous drop in hematocrit: Secondary | ICD-10-CM | POA: Diagnosis not present

## 2022-05-29 DIAGNOSIS — Z85828 Personal history of other malignant neoplasm of skin: Secondary | ICD-10-CM

## 2022-05-29 DIAGNOSIS — R7989 Other specified abnormal findings of blood chemistry: Secondary | ICD-10-CM

## 2022-05-29 DIAGNOSIS — W19XXXA Unspecified fall, initial encounter: Secondary | ICD-10-CM | POA: Diagnosis not present

## 2022-05-29 DIAGNOSIS — S0083XA Contusion of other part of head, initial encounter: Secondary | ICD-10-CM | POA: Diagnosis present

## 2022-05-29 DIAGNOSIS — N1832 Chronic kidney disease, stage 3b: Secondary | ICD-10-CM | POA: Diagnosis not present

## 2022-05-29 DIAGNOSIS — Z4789 Encounter for other orthopedic aftercare: Secondary | ICD-10-CM | POA: Diagnosis not present

## 2022-05-29 DIAGNOSIS — D509 Iron deficiency anemia, unspecified: Secondary | ICD-10-CM | POA: Diagnosis not present

## 2022-05-29 DIAGNOSIS — I10 Essential (primary) hypertension: Secondary | ICD-10-CM | POA: Diagnosis not present

## 2022-05-29 DIAGNOSIS — S42352A Displaced comminuted fracture of shaft of humerus, left arm, initial encounter for closed fracture: Principal | ICD-10-CM | POA: Diagnosis present

## 2022-05-29 DIAGNOSIS — I129 Hypertensive chronic kidney disease with stage 1 through stage 4 chronic kidney disease, or unspecified chronic kidney disease: Secondary | ICD-10-CM | POA: Diagnosis not present

## 2022-05-29 DIAGNOSIS — S0003XA Contusion of scalp, initial encounter: Secondary | ICD-10-CM | POA: Diagnosis not present

## 2022-05-29 DIAGNOSIS — S4982XA Other specified injuries of left shoulder and upper arm, initial encounter: Secondary | ICD-10-CM | POA: Diagnosis not present

## 2022-05-29 DIAGNOSIS — M79602 Pain in left arm: Secondary | ICD-10-CM | POA: Diagnosis present

## 2022-05-29 DIAGNOSIS — Z886 Allergy status to analgesic agent status: Secondary | ICD-10-CM

## 2022-05-29 DIAGNOSIS — G894 Chronic pain syndrome: Secondary | ICD-10-CM | POA: Diagnosis not present

## 2022-05-29 DIAGNOSIS — S42302A Unspecified fracture of shaft of humerus, left arm, initial encounter for closed fracture: Secondary | ICD-10-CM | POA: Diagnosis not present

## 2022-05-29 DIAGNOSIS — Z7901 Long term (current) use of anticoagulants: Secondary | ICD-10-CM

## 2022-05-29 DIAGNOSIS — Z888 Allergy status to other drugs, medicaments and biological substances status: Secondary | ICD-10-CM | POA: Diagnosis not present

## 2022-05-29 DIAGNOSIS — Z8673 Personal history of transient ischemic attack (TIA), and cerebral infarction without residual deficits: Secondary | ICD-10-CM | POA: Diagnosis not present

## 2022-05-29 DIAGNOSIS — M6281 Muscle weakness (generalized): Secondary | ICD-10-CM | POA: Diagnosis not present

## 2022-05-29 DIAGNOSIS — S42352S Displaced comminuted fracture of shaft of humerus, left arm, sequela: Secondary | ICD-10-CM | POA: Diagnosis not present

## 2022-05-29 DIAGNOSIS — Z8249 Family history of ischemic heart disease and other diseases of the circulatory system: Secondary | ICD-10-CM

## 2022-05-29 DIAGNOSIS — M852 Hyperostosis of skull: Secondary | ICD-10-CM | POA: Diagnosis not present

## 2022-05-29 DIAGNOSIS — K219 Gastro-esophageal reflux disease without esophagitis: Secondary | ICD-10-CM | POA: Diagnosis not present

## 2022-05-29 DIAGNOSIS — Y92129 Unspecified place in nursing home as the place of occurrence of the external cause: Secondary | ICD-10-CM

## 2022-05-29 DIAGNOSIS — M810 Age-related osteoporosis without current pathological fracture: Secondary | ICD-10-CM | POA: Diagnosis present

## 2022-05-29 DIAGNOSIS — D631 Anemia in chronic kidney disease: Secondary | ICD-10-CM | POA: Diagnosis present

## 2022-05-29 DIAGNOSIS — Z79899 Other long term (current) drug therapy: Secondary | ICD-10-CM

## 2022-05-29 DIAGNOSIS — N184 Chronic kidney disease, stage 4 (severe): Secondary | ICD-10-CM | POA: Diagnosis present

## 2022-05-29 DIAGNOSIS — Z79890 Hormone replacement therapy (postmenopausal): Secondary | ICD-10-CM

## 2022-05-29 DIAGNOSIS — Z794 Long term (current) use of insulin: Secondary | ICD-10-CM | POA: Diagnosis not present

## 2022-05-29 DIAGNOSIS — M054 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: Secondary | ICD-10-CM | POA: Diagnosis not present

## 2022-05-29 DIAGNOSIS — Z833 Family history of diabetes mellitus: Secondary | ICD-10-CM

## 2022-05-29 DIAGNOSIS — I2510 Atherosclerotic heart disease of native coronary artery without angina pectoris: Secondary | ICD-10-CM | POA: Diagnosis present

## 2022-05-29 DIAGNOSIS — E1129 Type 2 diabetes mellitus with other diabetic kidney complication: Secondary | ICD-10-CM | POA: Diagnosis present

## 2022-05-29 DIAGNOSIS — S42202A Unspecified fracture of upper end of left humerus, initial encounter for closed fracture: Secondary | ICD-10-CM | POA: Diagnosis not present

## 2022-05-29 DIAGNOSIS — R0689 Other abnormalities of breathing: Secondary | ICD-10-CM | POA: Diagnosis not present

## 2022-05-29 DIAGNOSIS — Z96653 Presence of artificial knee joint, bilateral: Secondary | ICD-10-CM | POA: Diagnosis present

## 2022-05-29 DIAGNOSIS — R079 Chest pain, unspecified: Secondary | ICD-10-CM | POA: Diagnosis not present

## 2022-05-29 DIAGNOSIS — S0990XA Unspecified injury of head, initial encounter: Secondary | ICD-10-CM | POA: Diagnosis not present

## 2022-05-29 DIAGNOSIS — G8918 Other acute postprocedural pain: Secondary | ICD-10-CM | POA: Diagnosis not present

## 2022-05-29 DIAGNOSIS — D62 Acute posthemorrhagic anemia: Secondary | ICD-10-CM | POA: Diagnosis not present

## 2022-05-29 DIAGNOSIS — R0902 Hypoxemia: Secondary | ICD-10-CM | POA: Diagnosis not present

## 2022-05-29 DIAGNOSIS — S42202S Unspecified fracture of upper end of left humerus, sequela: Secondary | ICD-10-CM | POA: Diagnosis not present

## 2022-05-29 DIAGNOSIS — S199XXA Unspecified injury of neck, initial encounter: Secondary | ICD-10-CM | POA: Diagnosis not present

## 2022-05-29 DIAGNOSIS — M7989 Other specified soft tissue disorders: Secondary | ICD-10-CM | POA: Diagnosis not present

## 2022-05-29 DIAGNOSIS — S42342A Displaced spiral fracture of shaft of humerus, left arm, initial encounter for closed fracture: Secondary | ICD-10-CM | POA: Diagnosis not present

## 2022-05-29 DIAGNOSIS — R2681 Unsteadiness on feet: Secondary | ICD-10-CM | POA: Diagnosis not present

## 2022-05-29 DIAGNOSIS — I700 Atherosclerosis of aorta: Secondary | ICD-10-CM | POA: Diagnosis not present

## 2022-05-29 DIAGNOSIS — Z881 Allergy status to other antibiotic agents status: Secondary | ICD-10-CM

## 2022-05-29 DIAGNOSIS — Z8739 Personal history of other diseases of the musculoskeletal system and connective tissue: Secondary | ICD-10-CM

## 2022-05-29 DIAGNOSIS — R42 Dizziness and giddiness: Secondary | ICD-10-CM

## 2022-05-29 DIAGNOSIS — R1311 Dysphagia, oral phase: Secondary | ICD-10-CM | POA: Diagnosis not present

## 2022-05-29 DIAGNOSIS — Z885 Allergy status to narcotic agent status: Secondary | ICD-10-CM

## 2022-05-29 DIAGNOSIS — Z91041 Radiographic dye allergy status: Secondary | ICD-10-CM

## 2022-05-29 DIAGNOSIS — Z01810 Encounter for preprocedural cardiovascular examination: Secondary | ICD-10-CM | POA: Diagnosis not present

## 2022-05-29 DIAGNOSIS — Z043 Encounter for examination and observation following other accident: Secondary | ICD-10-CM | POA: Diagnosis not present

## 2022-05-29 HISTORY — DX: Gastritis, unspecified, without bleeding: K29.70

## 2022-05-29 LAB — CBC WITH DIFFERENTIAL/PLATELET
Abs Immature Granulocytes: 0.03 10*3/uL (ref 0.00–0.07)
Basophils Absolute: 0.1 10*3/uL (ref 0.0–0.1)
Basophils Relative: 1 %
Eosinophils Absolute: 0 10*3/uL (ref 0.0–0.5)
Eosinophils Relative: 1 %
HCT: 35.2 % — ABNORMAL LOW (ref 36.0–46.0)
Hemoglobin: 11.1 g/dL — ABNORMAL LOW (ref 12.0–15.0)
Immature Granulocytes: 0 %
Lymphocytes Relative: 14 %
Lymphs Abs: 1.1 10*3/uL (ref 0.7–4.0)
MCH: 31.6 pg (ref 26.0–34.0)
MCHC: 31.5 g/dL (ref 30.0–36.0)
MCV: 100.3 fL — ABNORMAL HIGH (ref 80.0–100.0)
Monocytes Absolute: 0.6 10*3/uL (ref 0.1–1.0)
Monocytes Relative: 8 %
Neutro Abs: 6 10*3/uL (ref 1.7–7.7)
Neutrophils Relative %: 76 %
Platelets: 172 10*3/uL (ref 150–400)
RBC: 3.51 MIL/uL — ABNORMAL LOW (ref 3.87–5.11)
RDW: 13.2 % (ref 11.5–15.5)
WBC: 7.8 10*3/uL (ref 4.0–10.5)
nRBC: 0 % (ref 0.0–0.2)

## 2022-05-29 LAB — BASIC METABOLIC PANEL
Anion gap: 12 (ref 5–15)
BUN: 41 mg/dL — ABNORMAL HIGH (ref 8–23)
CO2: 21 mmol/L — ABNORMAL LOW (ref 22–32)
Calcium: 8.7 mg/dL — ABNORMAL LOW (ref 8.9–10.3)
Chloride: 104 mmol/L (ref 98–111)
Creatinine, Ser: 1.72 mg/dL — ABNORMAL HIGH (ref 0.44–1.00)
GFR, Estimated: 30 mL/min — ABNORMAL LOW (ref >60)
Glucose, Bld: 135 mg/dL — ABNORMAL HIGH (ref 70–99)
Potassium: 4.2 mmol/L (ref 3.5–5.1)
Sodium: 137 mmol/L (ref 135–145)

## 2022-05-29 MED ORDER — HYDROMORPHONE HCL 1 MG/ML IJ SOLN
0.5000 mg | Freq: Once | INTRAMUSCULAR | Status: AC
  Administered 2022-05-29: 0.5 mg via INTRAVENOUS
  Filled 2022-05-29: qty 0.5

## 2022-05-29 MED ORDER — MORPHINE SULFATE (PF) 4 MG/ML IV SOLN
4.0000 mg | Freq: Once | INTRAVENOUS | Status: AC
  Administered 2022-05-29: 4 mg via INTRAVENOUS
  Filled 2022-05-29: qty 1

## 2022-05-29 MED ORDER — ONDANSETRON HCL 4 MG/2ML IJ SOLN
4.0000 mg | Freq: Once | INTRAMUSCULAR | Status: AC
  Administered 2022-05-29: 4 mg via INTRAVENOUS
  Filled 2022-05-29: qty 2

## 2022-05-29 NOTE — H&P (Signed)
History and Physical    Chief Complaint: Fall    HISTORY OF PRESENT ILLNESS: Deborah Obrien is an 81 y.o. female seen today for fall  when she tripped and fell over her purse at her husbands [facility]. Severe shoulder pain on arrival and found to have fracture. Case was d/w Orthopedic dr. Sabra Heck by EDMD  And requested admission for pain control and rehab. Pt lives at home with husband and need help. Reports of hitting her  Head and arm. Per EDMD, Case discussed with Dr. Sabra Heck of orthopedics, who recommends sugar-tong/coaptation splint placement.   Splint was placed and are manipulated with improved alignment of humerus.    Pt has PMH of: Past Medical History:  Diagnosis Date   A-fib ([place]) 2016   Acid reflux    Acute respiratory failure ([place]) 04/09/2018   Anemia    Anxiety    Atrial flutter ([place]) 08/24/2017   Bowel obstruction (HCC)    Carotid artery disease (HCC)    Chronic kidney disease (CKD) stage G3a/A1, moderately decreased glomerular filtration rate (GFR) between 45-59 mL/min/1.73 square meter and albuminuria creatinine ratio less than 30 mg/g (HCC)    stage IV   Closed fracture of one rib of right side 05/29/2020   Complete tear of left rotator cuff 10/16/2014   Coronary artery disease    Depression    Diabetes mellitus without complication ([place])    Diarrhea 03/04/2022   Dyspnea    Dysrhythmia    PAROXYSMAL ATRIAL FIBRILLATION   Fatty liver    Gastritis without bleeding    GI bleed    Gout    Hypertension    Hypothyroidism    Low back pain    history of kyphoplasty t12-l1   Morbid obesity ([place])    Multiple gastric polyps    history of gi bleed.  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       Shoulder pain.  Neurological:  Positive for dizziness.       Fall.  All other systems reviewed and are negative.    ALLERGIES: Allergies  Allergen Reactions   Fish Allergy Shortness Of Breath    All kinds of fish. Severe vomitting even with smell of fish.    Macrolides And Ketolides Other (See Comments)    Severe stomach pain (mycins)   Meperidine Shortness Of Breath, Nausea Only and Other (See Comments)    Stomach pain    Other Nausea Only, Swelling, Rash, Anaphylaxis, Diarrhea, Shortness Of Breath and Other (See Comments)    Allergen: NON-STEROIDS, bextra - hands and feet swell  Reaction:  Stomach pain   Other Reaction: Intolerance   Prednisone Anaphylaxis and Other (See Comments)    (facial swelling/redness/burning)Increases pts blood  sugar  Pt states that she is allergic to all steroids.     Shellfish Allergy Anaphylaxis, Shortness Of Breath, Diarrhea, Nausea Only and Rash    Stomach pain    Sulfa Antibiotics Shortness Of Breath, Diarrhea, Nausea Only and Other (See Comments)    Reaction:  Stomach pain   Reaction:  Stomach pain  Other Reaction: Intolerance   Sulfacetamide Sodium Diarrhea, Nausea Only, Other (See Comments) and Shortness Of Breath    Reaction:  Stomach pain    Telbivudine Other (See Comments)    Unknown reaction   Uloric [Febuxostat] Anaphylaxis    Locks pt's body up    Aspirin Rash and Other (See Comments)    Stomach pain (tolerates lower doses)   Celecoxib Other (See Comments)     GI bleed, weakness, and stomach pain.     Cephalexin Diarrhea, Nausea Only and Other (See Comments)    stomach pain    Codeine Diarrhea, Nausea Only and Other (See Comments)    Reaction:  Stomach pain Pt tolerates morphine    Dilaudid [Hydromorphone Hcl] Other (See Comments)    Reactions: easy to overdose - blood pressure drops really low   Erythromycin Diarrhea, Nausea Only and Other (See Comments)    Reaction:  Stomach pain    Iodinated Contrast Media Rash and Other (See Comments)    Pt states that she is unable to have because she has chronic kidney disease.   CKD Pt states that she is unable to have because she has chronic kidney disease.   CKD    Lipitor [Atorvastatin Calcium] Nausea Only    Reaction: nausea, weakness, pass blood   Oxycodone Other (See Comments)    Reaction:  Stomach pain    Aleve [Naproxen]     Reaction: severe stomach pain   Atorvastatin Nausea Only and Other (See Comments)    Weakness    Cephalosporins Other (See Comments)    Reaction:  Unknown    Doxycycline     Stomach pain    Iodine Rash   Motrin [Ibuprofen]     Reaction: severe stomach pain   Tape Other (See Comments)    Causes pts skin to tear  Pt states that she is able to use paper tape.       Valdecoxib Swelling and Other (See Comments)    Pt states that her hands and feet swell.      PAST SURGICAL HISTORY: Past Surgical History:  Procedure Laterality Date   ABDOMINAL HYSTERECTOMY  1980   APPENDECTOMY     BACK SURGERY     2008. plate & screws    BACK SURGERY  2019   patient describes kyphoplasty for compression fractures, MD referral said fusion   BREAST BIOPSY Right 08/16   stereo fibroadenomatous change, neg for atypia   BREAST EXCISIONAL BIOPSY Left    neg   CARDIAC CATHETERIZATION Left 08/25/2016   Procedure: Left Heart Cath and Coronary Angiography;  Surgeon: Corey Skains, MD;  Location: [place] [facility];  Service: Cardiovascular;  Laterality: Left;   CARDIOVERSION  N/A 06/28/2017   Procedure: CARDIOVERSION;  Surgeon: Corey Skains, MD;  Location: [facility];  Service: Cardiovascular;  Laterality: N/A;   CARDIOVERSION N/A 02/06/2018   Procedure: CARDIOVERSION;  Surgeon: Josue Hector, MD;  Location: [place];  Service: Cardiovascular;  Laterality: N/A;   CARDIOVERSION N/A 07/15/2020   Procedure: CARDIOVERSION;  Surgeon: Jerline Pain, MD;  Location: [place];  Service: Cardiovascular;  Laterality: N/A;  CATARACT EXTRACTION, BILATERAL  2012   CHOLECYSTECTOMY     colon blockage  1999   COLON SURGERY  2000   removed 20% of colon. colon had collapsed   COLONOSCOPY  2016   polyps removed 2016   DORSAL COMPARTMENT RELEASE Left 09/14/2016   Procedure: RELEASE DORSAL COMPARTMENT (DEQUERVAIN);  Surgeon: Dereck Leep, MD;  Location: [facility];  Service: Orthopedics;  Laterality: Left;   ESOPHAGOGASTRODUODENOSCOPY N/A 01/27/2015   Procedure: ESOPHAGOGASTRODUODENOSCOPY (EGD);  Surgeon: Lollie Sails, MD;  Location: [facility]COPY;  Service: Endoscopy;  Laterality: N/A;   ESOPHAGOGASTRODUODENOSCOPY (EGD) WITH PROPOFOL N/A 07/24/2015   Procedure: ESOPHAGOGASTRODUODENOSCOPY (EGD) WITH PROPOFOL;  Surgeon: Lucilla Lame, MD;  Location: [facility];  Service: Endoscopy;  Laterality: N/A;   ESOPHAGOGASTRODUODENOSCOPY (EGD) WITH PROPOFOL N/A 04/12/2018   Procedure: ESOPHAGOGASTRODUODENOSCOPY (EGD) WITH PROPOFOL;  Surgeon: Jonathon Bellows, MD;  Location: [facility]OSCOPY;  Service: Gastroenterology;  Laterality: N/A;   ESOPHAGOGASTRODUODENOSCOPY (EGD) WITH PROPOFOL N/A 06/22/2018   Procedure: ESOPHAGOGASTRODUODENOSCOPY (EGD) WITH PROPOFOL;  Surgeon: [name], MD;  Location[facility];  Service: Endoscopy;  Laterality: N/A;   ESOPHAGOGASTRODUODENOSCOPY (EGD) WITH PROPOFOL N/A 07/09/2018   Procedure: ESOPHAGOGASTRODUODENOSCOPY (EGD) WITH PROPOFOL with resection of gastric polyps;  Surgeon: Jonathon Bellows, MD;  Location: [facility] ENDOSCOPY;  Service:  Gastroenterology;  Laterality: N/A;   ESOPHAGOGASTRODUODENOSCOPY (EGD) WITH PROPOFOL N/A 05/17/2019   Procedure: ESOPHAGOGASTRODUODENOSCOPY (EGD) WITH PROPOFOL;  Surgeon: Jonathon Bellows, MD;  Location: [facility] ENDOSCOPY;  Service: Gastroenterology;  Laterality: N/A;   EUS N/A 06/22/2018   Procedure: UPPER ENDOSCOPIC ULTRASOUND (EUS) RADIAL;  Surgeon: [name], MD;  Location: [facility] ENDOSCOPY;  Service: Endoscopy;  Laterality: N/A;   EYE SURGERY Bilateral    cataract extractions   JOINT REPLACEMENT Bilateral 2014   Bilateral Knee replacement   LEAD REVISION/REPAIR N/A 08/29/2017   Procedure: LEAD REVISION/REPAIR;  Surgeon: Constance Haw, MD;  Location: [place] [facility];  Service: Cardiovascular;  Laterality: N/A;   OOPHORECTOMY     PACEMAKER INSERTION Left 07/01/2015   Procedure: INSERTION PACEMAKER;  Surgeon: Isaias Cowman, MD;  Location: [facility];  Service: Cardiovascular;  Laterality: Left;   PACEMAKER REVISION N/A 08/28/2017   Procedure: PACEMAKER REVISION;  Surgeon: Deboraha Sprang, MD;  Location: [place] [facility];  Service: Cardiovascular;  Laterality: N/A;   REPLACEMENT TOTAL KNEE BILATERAL     SHOULDER ARTHROSCOPY WITH ROTATOR CUFF REPAIR AND OPEN BICEPS TENODESIS Left 11/21/2019   Procedure: LEFT SHOULDER ARTHROSCOPY WITH DEBRIDEMENT, DECOMPRESSION, ROTATOR CUFF REPAIR AND BICEPS TENOLYSIS;  Surgeon: Corky Mull, MD;  Location: [facility];  Service: Orthopedics;  Laterality: Left;   SHOULDER ARTHROSCOPY WITH SUBACROMIAL DECOMPRESSION Left 2013   TEE WITHOUT CARDIOVERSION N/A 06/28/2017   Procedure: TRANSESOPHAGEAL ECHOCARDIOGRAM (TEE);  Surgeon: Corey Skains, MD;  Location: [facility];  Service: Cardiovascular;  Laterality: N/A;     SOCIAL HISTORY: Social History   Socioeconomic History   Marital status: Married    Spouse name: [name]   Number of children: Not on file   Years of education: Not on file   Highest education level: Not on file  Occupational History    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    Comment: retired  Tobacco Use   Smoking status: Never   Smokeless tobacco: Never  Vaping Use   Vaping Use: Never used  Substance and Sexual Activity   Alcohol use: No   Drug use: No   Sexual activity: Yes  Other Topics Concern   Not on file  Social  History Narrative   DIABETIC   PACEMAKER   Social Determinants of Health   Financial Resource Strain: Not on file  Food Insecurity: Not on file  Transportation Needs: Not on file  Physical Activity: Not on file  Stress: Not on file  Social Connections: Not on file      CURRENT MEDS:  Current Facility-Administered Medications (Endocrine & Metabolic):    insulin aspart (novoLOG) injection 0-15 Units   levothyroxine (SYNTHROID) tablet 50 mcg   Current Facility-Administered Medications (Cardiovascular):    amiodarone (PACERONE) tablet 200 mg   hydrALAZINE (APRESOLINE) injection 10 mg     Current Facility-Administered Medications (Analgesics):    acetaminophen (TYLENOL) tablet 650 mg **OR** acetaminophen (TYLENOL) suppository 650 mg   HYDROmorphone (DILAUDID) injection 1 mg   Current Facility-Administered Medications (Hematological):    apixaban (ELIQUIS) tablet 2.5 mg   Current Facility-Administered Medications (Other):    0.9 %  sodium chloride infusion   DULoxetine (CYMBALTA) DR capsule 30 mg   pantoprazole (PROTONIX) injection 40 mg   sodium chloride flush (NS) 0.9 % injection 3 mL  No current outpatient medications on file.    ED Course: Pt in Ed alert awake oriented O2 sats of 100% on 2 L.  Vitals:   05/29/22 2330 05/30/22 0000 05/30/22 0045 05/30/22 0204  BP: (!) 120/49 (!) 131/47 (!) 127/49   Pulse: 60 60 (!) 59   Resp: 17 17 17    Temp:   98.2 F (36.8 C)   TempSrc:      SpO2: 99% 96% 100%   Weight:    78.8 kg  Height:    5\' 3"  (1.6 m)   No intake/output data recorded. SpO2: 100 % O2 Flow Rate (L/min): 2 L/min Blood work in ed shows  > Anemia with a  hemoglobin of 11.1, MCV of 100.3-chronic. CMP shows mild worsening of chronic kidney disease creatinine of 1.72 GFR of 30 putting patient at  stage IV. Results for orders placed or performed during the hospital encounter of 05/29/22 (from the past 24 hour(s))  CBC with Differential     Status: Abnormal   Collection Time: 05/29/22 10:29 PM  Result Value Ref Range   WBC 7.8 4.0 - 10.5 K/uL   RBC 3.51 (L) 3.87 - 5.11 MIL/uL   Hemoglobin 11.1 (L) 12.0 - 15.0 g/dL   HCT 35.2 (L) 36.0 - 46.0 %   MCV 100.3 (H) 80.0 - 100.0 fL   MCH 31.6 26.0 - 34.0 pg   MCHC 31.5 30.0 - 36.0 g/dL   RDW 13.2 11.5 - 15.5 %   Platelets 172 150 - 400 K/uL   nRBC 0.0 0.0 - 0.2 %   Neutrophils Relative % 76 %   Neutro Abs 6.0 1.7 - 7.7 K/uL   Lymphocytes Relative 14 %   Lymphs Abs 1.1 0.7 - 4.0 K/uL   Monocytes Relative 8 %   Monocytes Absolute 0.6 0.1 - 1.0 K/uL   Eosinophils Relative 1 %   Eosinophils Absolute 0.0 0.0 - 0.5 K/uL   Basophils Relative 1 %   Basophils Absolute 0.1 0.0 - 0.1 K/uL   Immature Granulocytes 0 %   Abs Immature Granulocytes 0.03 0.00 - 0.07 K/uL  Basic metabolic panel     Status: Abnormal   Collection Time: 05/29/22 10:29 PM  Result Value Ref Range   Sodium 137 135 - 145 mmol/L   Potassium 4.2 3.5 - 5.1 mmol/L   Chloride 104 98 - 111 mmol/L   CO2 21 (L)  22 - 32 mmol/L   Glucose, Bld 135 (H) 70 - 99 mg/dL   BUN 41 (H) 8 - 23 mg/dL   Creatinine, Ser 1.72 (H) 0.44 - 1.00 mg/dL   Calcium 8.7 (L) 8.9 - 10.3 mg/dL   GFR, Estimated 30 (L) >60 mL/min   Anion gap 12 5 - 15   In Ed pt received  Meds ordered this encounter  Medications   morphine (PF) 4 MG/ML injection 4 mg   ondansetron (ZOFRAN) injection 4 mg   HYDROmorphone (DILAUDID) injection 0.5 mg   HYDROmorphone (DILAUDID) injection 0.5 mg   HYDROmorphone (DILAUDID) injection 0.5 mg   amiodarone (PACERONE) tablet 200 mg   DULoxetine (CYMBALTA) DR capsule 30 mg   apixaban (ELIQUIS) tablet 2.5 mg   levothyroxine  (SYNTHROID) tablet 50 mcg   insulin aspart (novoLOG) injection 0-15 Units    Order Specific Question:   Correction coverage:    Answer:   Moderate (average weight, post-op)    Order Specific Question:   CBG < 70:    Answer:   implement hypoglycemia protocol    Order Specific Question:   CBG 70 - 120:    Answer:   0 units    Order Specific Question:   CBG 121 - 150:    Answer:   2 units    Order Specific Question:   CBG 151 - 200:    Answer:   3 units    Order Specific Question:   CBG 201 - 250:    Answer:   5 units    Order Specific Question:   CBG 251 - 300:    Answer:   8 units    Order Specific Question:   CBG 301 - 350:    Answer:   11 units    Order Specific Question:   CBG 351 - 400:    Answer:   15 units    Order Specific Question:   CBG > 400    Answer:   call MD and obtain STAT lab verification   sodium chloride flush (NS) 0.9 % injection 3 mL   0.9 %  sodium chloride infusion   OR Linked Order Group    acetaminophen (TYLENOL) tablet 650 mg    acetaminophen (TYLENOL) suppository 650 mg   hydrALAZINE (APRESOLINE) injection 10 mg   HYDROmorphone (DILAUDID) injection 1 mg   pantoprazole (PROTONIX) injection 40 mg    Unresulted Labs (From admission, onward)     Start     Ordered   05/30/22 0500  Comprehensive metabolic panel  Tomorrow morning,   STAT        05/30/22 0044   05/30/22 0500  CBC  Tomorrow morning,   STAT        05/30/22 0044   05/30/22 0214  Type and screen  Once,   R        05/30/22 0215   05/30/22 0040  Hemoglobin A1c  Add-on,   AD       Comments: To assess prior glycemic control    05/30/22 0044   Unscheduled  Occult blood card to lab, stool  As needed,   TIMED      05/30/22 0215             Admission Imaging : DG Humerus Left  Result Date: 05/29/2022 CLINICAL DATA:  Status post reduction EXAM: LEFT HUMERUS - 2+ VIEW COMPARISON:  Films from earlier in the same day. FINDINGS: Midshaft left humeral fracture is again identified. The  degree of  displacement has been reduced slightly. No new focal abnormality is noted. IMPRESSION: Status post slight reduction in the mid left humeral fracture. Electronically Signed   By: Inez Catalina M.D.   On: 05/29/2022 22:54   CT Head Wo Contrast  Result Date: 05/29/2022 CLINICAL DATA:  Head trauma, minor (Age >= 65y); Neck trauma (Age >= 65y) EXAM: CT HEAD WITHOUT CONTRAST CT CERVICAL SPINE WITHOUT CONTRAST TECHNIQUE: Multidetector CT imaging of the head and cervical spine was performed following the standard protocol without intravenous contrast. Multiplanar CT image reconstructions of the cervical spine were also generated. RADIATION DOSE REDUCTION: This exam was performed according to the departmental dose-optimization program which includes automated exposure control, adjustment of the mA and/or kV according to patient size and/or use of iterative reconstruction technique. COMPARISON:  None Available. FINDINGS: CT HEAD FINDINGS BRAIN: BRAIN Patchy and confluent areas of decreased attenuation are noted throughout the deep and periventricular white matter of the cerebral hemispheres bilaterally, compatible with chronic microvascular ischemic disease. No evidence of large-territorial acute infarction. No parenchymal hemorrhage. No mass lesion. No extra-axial collection. No mass effect or midline shift. No hydrocephalus. Basilar cisterns are patent. Vascular: No hyperdense vessel. Skull: No acute fracture or focal lesion. Hyperostosis frontalis interna. Sinuses/Orbits: Paranasal sinuses and mastoid air cells are clear. Bilateral lens replacement. Otherwise the orbits are unremarkable. Other: Trace right frontal scalp hematoma formation (3 mm). CT CERVICAL SPINE FINDINGS Alignment: Normal. Skull base and vertebrae: Multilevel mild-to-moderate degenerative changes of the spine. C6-C7 anterior cervical discectomy and fusion. No acute fracture. No aggressive appearing focal osseous lesion or focal pathologic process.  Soft tissues and spinal canal: No prevertebral fluid or swelling. No visible canal hematoma. Upper chest: Trace biapical pleural/pulmonary scarring. Other: None. IMPRESSION: 1. No acute intracranial abnormality. 2. No acute displaced fracture or traumatic listhesis of the cervical spine in a patient status post C6-C7 anterior cervical discectomy and fusion. Electronically Signed   By: Iven Finn M.D.   On: 05/29/2022 19:25   CT Cervical Spine Wo Contrast  Result Date: 05/29/2022 CLINICAL DATA:  Head trauma, minor (Age >= 65y); Neck trauma (Age >= 65y) EXAM: CT HEAD WITHOUT CONTRAST CT CERVICAL SPINE WITHOUT CONTRAST TECHNIQUE: Multidetector CT imaging of the head and cervical spine was performed following the standard protocol without intravenous contrast. Multiplanar CT image reconstructions of the cervical spine were also generated. RADIATION DOSE REDUCTION: This exam was performed according to the departmental dose-optimization program which includes automated exposure control, adjustment of the mA and/or kV according to patient size and/or use of iterative reconstruction technique. COMPARISON:  None Available. FINDINGS: CT HEAD FINDINGS BRAIN: BRAIN Patchy and confluent areas of decreased attenuation are noted throughout the deep and periventricular white matter of the cerebral hemispheres bilaterally, compatible with chronic microvascular ischemic disease. No evidence of large-territorial acute infarction. No parenchymal hemorrhage. No mass lesion. No extra-axial collection. No mass effect or midline shift. No hydrocephalus. Basilar cisterns are patent. Vascular: No hyperdense vessel. Skull: No acute fracture or focal lesion. Hyperostosis frontalis interna. Sinuses/Orbits: Paranasal sinuses and mastoid air cells are clear. Bilateral lens replacement. Otherwise the orbits are unremarkable. Other: Trace right frontal scalp hematoma formation (3 mm). CT CERVICAL SPINE FINDINGS Alignment: Normal. Skull  base and vertebrae: Multilevel mild-to-moderate degenerative changes of the spine. C6-C7 anterior cervical discectomy and fusion. No acute fracture. No aggressive appearing focal osseous lesion or focal pathologic process. Soft tissues and spinal canal: No prevertebral fluid or swelling. No visible canal hematoma. Upper chest: Trace  biapical pleural/pulmonary scarring. Other: None. IMPRESSION: 1. No acute intracranial abnormality. 2. No acute displaced fracture or traumatic listhesis of the cervical spine in a patient status post C6-C7 anterior cervical discectomy and fusion. Electronically Signed   By: Iven Finn M.D.   On: 05/29/2022 19:25   DG Chest Portable 1 View  Result Date: 05/29/2022 CLINICAL DATA:  Fall. EXAM: PORTABLE CHEST 1 VIEW COMPARISON:  None Available. FINDINGS: The heart size and mediastinal contours are within normal limits. Atherosclerotic calcification of the aortic arch. Left IJ access pacemaker with leads terminating in the right atrium and right ventricle. Both lungs are clear without evidence of focal consolidation or pleural effusion. Partially imaged fracture of the left humerus. IMPRESSION: No acute cardiopulmonary process. Partially imaged left humeral comminuted fracture. Electronically Signed   By: Keane Police D.O.   On: 05/29/2022 15:41   DG Knee 2 Views Right  Result Date: 05/29/2022 CLINICAL DATA:  Fall. EXAM: RIGHT KNEE - 1-2 VIEW COMPARISON:  None Available. FINDINGS: Status post right knee arthroplasty. The hardware is intact. No perihardware loosening. No acute fracture or dislocation. Soft tissues are unremarkable. IMPRESSION: No acute osseous abnormality. Electronically Signed   By: Keane Police D.O.   On: 05/29/2022 15:39   DG Humerus Left  Result Date: 05/29/2022 CLINICAL DATA:  Tripped and fell, left arm deformity EXAM: LEFT HUMERUS - 2+ VIEW COMPARISON:  12/22/2014 FINDINGS: Frontal and lateral views of the left humerus are obtained. There is a  comminuted fracture at the junction of the proximal and middle third of the humeral diaphysis, with mild displacement and varus angulation at the fracture site. Alignment of the left shoulder and elbow remains anatomic. Diffuse soft tissue swelling. IMPRESSION: 1. Comminuted left humeral diaphyseal fracture, mild displacement and varus angulation as above. Electronically Signed   By: Randa Ngo M.D.   On: 05/29/2022 15:39      Physical Examination: Vitals:   05/29/22 2330 05/30/22 0000 05/30/22 0045 05/30/22 0204  BP: (!) 120/49 (!) 131/47 (!) 127/49   Pulse: 60 60 (!) 59   Temp:   98.2 F (36.8 C)   Resp: 17 17 17    Height:    5\' 3"  (1.6 m)  Weight:    78.8 kg  SpO2: 99% 96% 100%   TempSrc:      BMI (Calculated):    30.78   Physical Exam Constitutional:      Appearance: She is not ill-appearing.  HENT:     Right Ear: External ear normal.     Left Ear: External ear normal.     Mouth/Throat:     Mouth: Mucous membranes are moist.  Eyes:     Extraocular Movements: Extraocular movements intact.  Cardiovascular:     Rate and Rhythm: Normal rate and regular rhythm.     Pulses: Normal pulses.     Heart sounds: Normal heart sounds.  Pulmonary:     Effort: Pulmonary effort is normal.     Breath sounds: Normal breath sounds.  Abdominal:     General: Bowel sounds are normal. There is no distension.     Palpations: Abdomen is soft. There is no mass.     Tenderness: There is no abdominal tenderness. There is no guarding.  Musculoskeletal:     Right lower leg: No edema.     Left lower leg: No edema.  Neurological:     General: No focal deficit present.     Mental Status: She is alert and oriented to person,  place, and time.  Psychiatric:        Mood and Affect: Mood normal.        Behavior: Behavior normal.     Assessment and Plan: * Fall Mechanical fall and we will start fall precaution. PT consult for gait strengthening and fall prevention.   Humerus fracture Per  ortho  sugar-tong/coaptation splint placement.   Splint was placed and are manipulated with improved alignment of humerus.  PT Consult.  Dizziness Pt reports dizziness , in her case pt has ahd TIA/ and also has anemia and is on anticoagulation.  We will obtain orthostatics and will manage and with med titration/ elimination etc while in house.  We will also obtain MRI if pacemaker is MRI compatible or repeat Head CT [place] in AM,as she has h/o TIA and history is suspicious presyncope / syncope.  Will defer to PCP for ongoing  evaluation with Anemia eval from chronic blood loss. Pt also needs to  Be aware of orthostatic changes in BP as she is on diuretic therapy and may result in hypotension if only intermittent. Pt is also on cymbalta which is commonly known to cause orthostatic hypotension.    Permanent atrial fibrillation (HCC) Pt in junctional rhythm per ekg now.  Interval normal.  We will continue amiodarone and eliquis.  Last cardiology visit  On 03/2022:  Hx of pacemaker [employer]  for sinus node dysfunction single chamber pacemaker for which she underwent dual chamber upgrade 12/18 cx by encircling of the chronic RV lead by the atrial lead resulting in tension for which she underwent RA lead revision. Cont eliquis.  Remo Lipps klein-[facility].   Gastro-esophageal reflux disease without esophagitis Iv ppi.  Cont ppi on d/c due to anemia.   Benign essential HTN Vitals:   05/29/22 1730 05/29/22 1800 05/29/22 1810 05/29/22 1820  BP: (!) 141/59 (!) 133/58 (!) 124/54 (!) 136/59   05/29/22 1830 05/29/22 1930 05/29/22 2000 05/29/22 2030  BP: 127/60 (!) 131/53 (!) 132/53 (!) 125/49   05/29/22 2100 05/29/22 2130 05/29/22 2200 05/29/22 2215  BP: (!) 118/53 (!) 129/49 (!) 135/48 (!) 142/95  intermittent hypotension due to diuretic use which we will hold.    CAD (coronary artery disease) Stable. No chest pain. Cont eliquis and pravachol.   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 We will follow and defer to pcp for evaluate and monitor.   Iron deficiency anemia MCV is macrocytic. Suspect combination of ACD and IDA.    Hypothyroidism Cont levothyroxine at 50 mcg.   Acute kidney injury superimposed on chronic kidney disease (HCC)    Latest Ref Rng & Units 05/29/2022   10:29 PM 03/06/2022    4:53 AM 03/05/2022    5:23 AM  CMP  Glucose 70 - 99 mg/dL 135  356  402   BUN 8 - 23 mg/dL 41  23  25   Creatinine 0.44 - 1.00 mg/dL 1.72  1.40  1.44   Sodium 135 - 145 mmol/L 137  132  133   Potassium 3.5 - 5.1 mmol/L 4.2  4.8  4.5   Chloride 98 - 111 mmol/L 104  103  102   CO2 22 - 32 mmol/L 21  22  26    Calcium 8.9 - 10.3 mg/dL 8.7  8.9  8.3   AKI on CKD stage iv. Avoid contrast study. Renally dose meds.  monitor electrolytes as pt is on diuretics.   Chronic kidney disease, stage 4 (severe) (HCC)     DVT prophylaxis:  SCD's / eliqius.   Code Status:  Full Code    Family Communication:  Donovan, Gatchel (Son)  [phone] [facility] Phone)   Disposition Plan:  Home    Consults called:  None   Admission status: Observation.    Unit/ Expected LOS: Med tele/ 1-2 days.    Para Skeans MD [facility]  6 PM- 2 AM. Please contact me via secure Chat 6 PM-2 AM. [phone] ( Pager ) To contact the [facility] Attending or Consulting provider [place] or covering provider during after hours [place], for this patient.   Check the care team in [facility] and look for a) attending/consulting [facility] provider listed and b) the [facility] team listed Log into [email or web address] and use [place]'s universal password to access. If you do not have the password, please contact the  hospital operator. Locate the [facility] provider you are looking for under [facility] and page to a number that you can be directly reached. If you still have difficulty reaching the provider, please page the [facility] (Director on Call) for the Hospitalists listed on amion for assistance. [email or web address] 05/30/2022, 2:21 AM

## 2022-05-29 NOTE — Assessment & Plan Note (Signed)
    Latest Ref Rng & Units 05/29/2022   10:29 PM 03/06/2022    4:53 AM 03/05/2022    5:23 AM  CBC  WBC 4.0 - 10.5 K/uL 7.8  3.5  3.2   Hemoglobin 12.0 - 15.0 g/dL 11.1  11.8  10.7   Hematocrit 36.0 - 46.0 % 35.2  35.6  32.6   Platelets 150 - 400 K/uL 172  131  115    Chart says pt has ACD from kidney disease. We will follow and defer to pcp for evaluate and monitor.

## 2022-05-29 NOTE — Assessment & Plan Note (Signed)
Mechanical fall and we will start fall precaution. PT consult for gait strengthening and fall prevention.

## 2022-05-29 NOTE — ED Provider Notes (Signed)
[facility] Provider Note    Event Date/Time   First MD Initiated Contact with Patient 05/29/22 1500     (approximate)   History   Chief Complaint Fall   HPI  Deborah Obrien is a 81 y.o. female with past medical history of hypertension, diabetes, CHF, atrial fibrillation on Eliquis, CKD, anemia, GERD, gout, and chronic pain syndrome who presents to the ED complaining of fall.  Patient reports that just prior to arrival she tripped and fell over her purse while visiting her husband at his nursing facility.  She reports hitting her left arm as well as her head, denies losing consciousness.  She now complains of severe pain in the middle of her left upper arm that moves up into her shoulder.  She denies significant pain in her elbow or wrist.  She additionally complains of some pain in her right knee, denies any pain in her right upper extremity or left lower extremity.  She denies any pain in her chest or abdomen, does report some pain in her neck.     Physical Exam   Triage Vital Signs: ED Triage Vitals  Enc Vitals Group     BP --      Pulse --      Resp --      Temp --      Temp src --      SpO2 --      Weight 05/29/22 1456 170 lb (77.1 kg)     Height --      Head Circumference --      Peak Flow --      Pain Score 05/29/22 1455 10     Pain Loc --      Pain Edu? --      Excl. in [place]? --     Most recent vital signs: Vitals:   05/29/22 2200 05/29/22 2215  BP: (!) 135/48 (!) 142/95  Pulse: (!) 59 62  Resp:  20  Temp:    SpO2: 100% 100%    Constitutional: Alert and oriented. Eyes: Conjunctivae are normal. Head: Right frontal scalp hematoma noted. Nose: No congestion/rhinnorhea. Mouth/Throat: Mucous membranes are moist.  Neck: Midline cervical spine tenderness to palpation noted. Cardiovascular: Normal rate, regular rhythm. Grossly normal heart sounds.  2+ radial pulses bilaterally. Respiratory: Normal respiratory effort.  No  retractions. Lungs CTAB.  No chest wall tenderness to palpation. Gastrointestinal: Soft and nontender. No distention. Musculoskeletal: Diffuse tenderness to palpation at right knee with ecchymosis and mild edema, no obvious deformity.  Significant tenderness to palpation along midshaft of left humerus extending up into left shoulder with obvious deformity.  No tenderness at left elbow or left wrist.  No right upper extremity or left lower extremity bony tenderness to palpation. Neurologic:  Normal speech and language. No gross focal neurologic deficits are appreciated.    ED Results / Procedures / Treatments   Labs (all labs ordered are listed, but only abnormal results are displayed) Labs Reviewed  CBC WITH DIFFERENTIAL/PLATELET - Abnormal; Notable for the following components:      Result Value   RBC 3.51 (*)    Hemoglobin 11.1 (*)    HCT 35.2 (*)    MCV 100.3 (*)    All other components within normal limits  BASIC METABOLIC PANEL - Abnormal; Notable for the following components:   CO2 21 (*)    Glucose, Bld 135 (*)    BUN 41 (*)    Creatinine, Ser 1.72 (*)  Calcium 8.7 (*)    GFR, Estimated 30 (*)    All other components within normal limits     EKG  ED ECG REPORT I, Blake Divine, the attending physician, personally viewed and interpreted this ECG.   Date: 05/29/2022  EKG Time: 22:46  Rate: 60  Rhythm: normal sinus rhythm  Axis: Normal  Intervals:none  ST&T Change: None  RADIOLOGY Left humerus x-ray reviewed and interpreted by me with comminuted, displaced, and angulated midshaft fracture.  Right knee x-ray reviewed and interpreted by me with no fracture or dislocation.  PROCEDURES:  Critical Care performed: No  .Ortho Injury Treatment  Date/Time: 05/29/2022 11:29 PM  Performed by: Blake Divine, MD Authorized by: Blake Divine, MD   Consent:    Consent obtained:  Verbal   Consent given by:  PatientInjury location: upper arm Location details: left  upper arm Injury type: fracture Fracture type: humeral shaft Pre-procedure neurovascular assessment: neurovascularly intact Pre-procedure distal perfusion: normal Pre-procedure neurological function: normal Pre-procedure range of motion: normal  Anesthesia: Local anesthesia used: no  Patient sedated: NoManipulation performed: yes Skin traction used: yes Skeletal traction used: yes Reduction successful: yes X-ray confirmed reduction: yes Immobilization: splint and sling Splint type: Coaptation. Splint Applied by: ED Provider Supplies used: cotton padding, Ortho-Glass and elastic bandage Post-procedure neurovascular assessment: post-procedure neurovascularly intact Post-procedure distal perfusion: normal Post-procedure neurological function: normal Post-procedure range of motion: normal      MEDICATIONS ORDERED IN ED: Medications  morphine (PF) 4 MG/ML injection 4 mg (4 mg Intravenous Given 05/29/22 1530)  ondansetron (ZOFRAN) injection 4 mg (4 mg Intravenous Given 05/29/22 1530)  HYDROmorphone (DILAUDID) injection 0.5 mg (0.5 mg Intravenous Given 05/29/22 1756)  HYDROmorphone (DILAUDID) injection 0.5 mg (0.5 mg Intravenous Given 05/29/22 1922)  HYDROmorphone (DILAUDID) injection 0.5 mg (0.5 mg Intravenous Given 05/29/22 2146)     IMPRESSION / MDM / ASSESSMENT AND PLAN / ED COURSE  I reviewed the triage vital signs and the nursing notes.                              81 y.o. female with past medical history of hypertension, diabetes, atrial fibrillation on Eliquis, CHF, CKD, GERD, gout, chronic pain syndrome, and anemia who presents to the ED complaining of trip and fall just prior to arrival onto her left arm as well as striking her head.  Patient's presentation is most consistent with acute presentation with potential threat to life or bodily function.  Differential diagnosis includes, but is not limited to, intracranial hemorrhage, cervical spine injury, humeral fracture,  shoulder dislocation, knee injury.  Patient uncomfortable appearing, complains of severe pain in her left upper arm with obvious deformity.  She is neurovascular intact to her distal left upper extremity, x-rays show comminuted and angulated fracture of the midshaft of the humerus, shoulder intact with no dislocation.  Right knee x-ray is unremarkable, CT imaging of head and cervical spine at is also unremarkable.  Patient with significant ongoing pain despite IV fentanyl given by EMS, pain now improved following multiple rounds of IV Dilaudid.  Case discussed with Dr. Sabra Heck of orthopedics, who recommends sugar-tong/coaptation splint placement.  Splint was placed and are manipulated with improved alignment of humerus.  Son states that patient currently lives alone, continues to have significant pain and cannot manage the symptoms at home, has had multiple falls recently.  EKG shows no evidence of arrhythmia or ischemia, labs show mild AKI without significant electrolyte abnormality,  no significant anemia or leukocytosis noted.  Case discussed with hospitalist for admission for pain control, PT/OT, and likely rehab placement.      FINAL CLINICAL IMPRESSION(S) / ED DIAGNOSES   Final diagnoses:  Fall, initial encounter  Closed displaced comminuted fracture of shaft of left humerus, initial encounter     Rx / DC Orders   ED Discharge Orders     None        Note:  This document was prepared using Dragon voice recognition software and may include unintentional dictation errors.   Blake Divine, MD 05/29/22 [phone]

## 2022-05-29 NOTE — Assessment & Plan Note (Signed)
Pt in junctional rhythm per ekg now.  Interval normal.  We will continue amiodarone and eliquis.

## 2022-05-29 NOTE — Assessment & Plan Note (Addendum)
Stable.  Holding Eliquis.  Holding pravastatin with slightly elevated liver function test.

## 2022-05-29 NOTE — Assessment & Plan Note (Signed)
Per ortho  sugar-tong/coaptation splint placement.   Splint was placed and are manipulated with improved alignment of humerus.  PT Consult.

## 2022-05-29 NOTE — ED Triage Notes (Signed)
Pt arrived via EMS from a nursing home where pt was visiting her husband and tripped and fell over her purse. Pt has severe pain to the upper part of her left shoulder.

## 2022-05-29 NOTE — ED Notes (Signed)
Ice placed on deformity, RN immobilized ext with pillows and positioned arm to a comfortable position.

## 2022-05-29 NOTE — ED Notes (Signed)
MD at bedside placing splint

## 2022-05-29 NOTE — Assessment & Plan Note (Signed)
Oral PPI  

## 2022-05-29 NOTE — Assessment & Plan Note (Deleted)
MCV is macrocytic. Suspect combination of ACD and IDA.

## 2022-05-29 NOTE — Assessment & Plan Note (Signed)
Vitals:   05/29/22 1730 05/29/22 1800 05/29/22 1810 05/29/22 1820  BP: (!) 141/59 (!) 133/58 (!) 124/54 (!) 136/59   05/29/22 1830 05/29/22 1930 05/29/22 2000 05/29/22 2030  BP: 127/60 (!) 131/53 (!) 132/53 (!) 125/49   05/29/22 2100 05/29/22 2130 05/29/22 2200 05/29/22 2215  BP: (!) 118/53 (!) 129/49 (!) 135/48 (!) 142/95  intermittent hypotension due to diuretic use which we will hold.

## 2022-05-30 ENCOUNTER — Observation Stay: Payer: Medicare Other

## 2022-05-30 ENCOUNTER — Encounter: Payer: Self-pay | Admitting: Internal Medicine

## 2022-05-30 DIAGNOSIS — N1832 Chronic kidney disease, stage 3b: Secondary | ICD-10-CM | POA: Diagnosis present

## 2022-05-30 DIAGNOSIS — S42202S Unspecified fracture of upper end of left humerus, sequela: Secondary | ICD-10-CM

## 2022-05-30 DIAGNOSIS — Z794 Long term (current) use of insulin: Secondary | ICD-10-CM | POA: Diagnosis not present

## 2022-05-30 DIAGNOSIS — Z888 Allergy status to other drugs, medicaments and biological substances status: Secondary | ICD-10-CM | POA: Diagnosis not present

## 2022-05-30 DIAGNOSIS — Z8673 Personal history of transient ischemic attack (TIA), and cerebral infarction without residual deficits: Secondary | ICD-10-CM | POA: Diagnosis not present

## 2022-05-30 DIAGNOSIS — K219 Gastro-esophageal reflux disease without esophagitis: Secondary | ICD-10-CM

## 2022-05-30 DIAGNOSIS — N189 Chronic kidney disease, unspecified: Secondary | ICD-10-CM

## 2022-05-30 DIAGNOSIS — M79602 Pain in left arm: Secondary | ICD-10-CM | POA: Diagnosis present

## 2022-05-30 DIAGNOSIS — M810 Age-related osteoporosis without current pathological fracture: Secondary | ICD-10-CM | POA: Diagnosis present

## 2022-05-30 DIAGNOSIS — K760 Fatty (change of) liver, not elsewhere classified: Secondary | ICD-10-CM | POA: Diagnosis present

## 2022-05-30 DIAGNOSIS — I4821 Permanent atrial fibrillation: Secondary | ICD-10-CM

## 2022-05-30 DIAGNOSIS — M1990 Unspecified osteoarthritis, unspecified site: Secondary | ICD-10-CM | POA: Diagnosis present

## 2022-05-30 DIAGNOSIS — Z8739 Personal history of other diseases of the musculoskeletal system and connective tissue: Secondary | ICD-10-CM

## 2022-05-30 DIAGNOSIS — S42352A Displaced comminuted fracture of shaft of humerus, left arm, initial encounter for closed fracture: Secondary | ICD-10-CM | POA: Diagnosis present

## 2022-05-30 DIAGNOSIS — G2581 Restless legs syndrome: Secondary | ICD-10-CM | POA: Diagnosis present

## 2022-05-30 DIAGNOSIS — I129 Hypertensive chronic kidney disease with stage 1 through stage 4 chronic kidney disease, or unspecified chronic kidney disease: Secondary | ICD-10-CM | POA: Diagnosis present

## 2022-05-30 DIAGNOSIS — Z882 Allergy status to sulfonamides status: Secondary | ICD-10-CM | POA: Diagnosis not present

## 2022-05-30 DIAGNOSIS — R710 Precipitous drop in hematocrit: Secondary | ICD-10-CM

## 2022-05-30 DIAGNOSIS — Z886 Allergy status to analgesic agent status: Secondary | ICD-10-CM | POA: Diagnosis not present

## 2022-05-30 DIAGNOSIS — I2510 Atherosclerotic heart disease of native coronary artery without angina pectoris: Secondary | ICD-10-CM | POA: Diagnosis present

## 2022-05-30 DIAGNOSIS — N179 Acute kidney failure, unspecified: Secondary | ICD-10-CM | POA: Diagnosis present

## 2022-05-30 DIAGNOSIS — E1122 Type 2 diabetes mellitus with diabetic chronic kidney disease: Secondary | ICD-10-CM | POA: Diagnosis present

## 2022-05-30 DIAGNOSIS — S42332A Displaced oblique fracture of shaft of humerus, left arm, initial encounter for closed fracture: Secondary | ICD-10-CM | POA: Diagnosis not present

## 2022-05-30 DIAGNOSIS — S42342A Displaced spiral fracture of shaft of humerus, left arm, initial encounter for closed fracture: Secondary | ICD-10-CM | POA: Diagnosis not present

## 2022-05-30 DIAGNOSIS — N184 Chronic kidney disease, stage 4 (severe): Secondary | ICD-10-CM | POA: Diagnosis present

## 2022-05-30 DIAGNOSIS — Y92129 Unspecified place in nursing home as the place of occurrence of the external cause: Secondary | ICD-10-CM | POA: Diagnosis not present

## 2022-05-30 DIAGNOSIS — G894 Chronic pain syndrome: Secondary | ICD-10-CM | POA: Diagnosis present

## 2022-05-30 DIAGNOSIS — D509 Iron deficiency anemia, unspecified: Secondary | ICD-10-CM | POA: Diagnosis present

## 2022-05-30 DIAGNOSIS — D62 Acute posthemorrhagic anemia: Secondary | ICD-10-CM | POA: Diagnosis not present

## 2022-05-30 DIAGNOSIS — R7989 Other specified abnormal findings of blood chemistry: Secondary | ICD-10-CM

## 2022-05-30 DIAGNOSIS — M109 Gout, unspecified: Secondary | ICD-10-CM | POA: Diagnosis present

## 2022-05-30 DIAGNOSIS — S42202A Unspecified fracture of upper end of left humerus, initial encounter for closed fracture: Secondary | ICD-10-CM | POA: Diagnosis not present

## 2022-05-30 DIAGNOSIS — W010XXA Fall on same level from slipping, tripping and stumbling without subsequent striking against object, initial encounter: Secondary | ICD-10-CM | POA: Diagnosis present

## 2022-05-30 DIAGNOSIS — Z20822 Contact with and (suspected) exposure to covid-19: Secondary | ICD-10-CM | POA: Diagnosis present

## 2022-05-30 DIAGNOSIS — E039 Hypothyroidism, unspecified: Secondary | ICD-10-CM | POA: Diagnosis present

## 2022-05-30 DIAGNOSIS — I25118 Atherosclerotic heart disease of native coronary artery with other forms of angina pectoris: Secondary | ICD-10-CM

## 2022-05-30 DIAGNOSIS — E1149 Type 2 diabetes mellitus with other diabetic neurological complication: Secondary | ICD-10-CM | POA: Diagnosis present

## 2022-05-30 LAB — CBC
HCT: 30.2 % — ABNORMAL LOW (ref 36.0–46.0)
Hemoglobin: 9.4 g/dL — ABNORMAL LOW (ref 12.0–15.0)
MCH: 31 pg (ref 26.0–34.0)
MCHC: 31.1 g/dL (ref 30.0–36.0)
MCV: 99.7 fL (ref 80.0–100.0)
Platelets: 165 10*3/uL (ref 150–400)
RBC: 3.03 MIL/uL — ABNORMAL LOW (ref 3.87–5.11)
RDW: 13.2 % (ref 11.5–15.5)
WBC: 6.2 10*3/uL (ref 4.0–10.5)
nRBC: 0 % (ref 0.0–0.2)

## 2022-05-30 LAB — TYPE AND SCREEN
ABO/RH(D): A POS
Antibody Screen: NEGATIVE

## 2022-05-30 LAB — GLUCOSE, CAPILLARY
Glucose-Capillary: 146 mg/dL — ABNORMAL HIGH (ref 70–99)
Glucose-Capillary: 263 mg/dL — ABNORMAL HIGH (ref 70–99)
Glucose-Capillary: 200 mg/dL — ABNORMAL HIGH (ref 70–99)
Glucose-Capillary: 113 mg/dL — ABNORMAL HIGH (ref 70–99)

## 2022-05-30 LAB — COMPREHENSIVE METABOLIC PANEL
ALT: 50 U/L — ABNORMAL HIGH (ref 0–44)
AST: 76 U/L — ABNORMAL HIGH (ref 15–41)
Albumin: 3.1 g/dL — ABNORMAL LOW (ref 3.5–5.0)
Alkaline Phosphatase: 175 U/L — ABNORMAL HIGH (ref 38–126)
Anion gap: 5 (ref 5–15)
BUN: 42 mg/dL — ABNORMAL HIGH (ref 8–23)
CO2: 27 mmol/L (ref 22–32)
Calcium: 8.2 mg/dL — ABNORMAL LOW (ref 8.9–10.3)
Chloride: 104 mmol/L (ref 98–111)
Creatinine, Ser: 1.64 mg/dL — ABNORMAL HIGH (ref 0.44–1.00)
GFR, Estimated: 31 mL/min — ABNORMAL LOW (ref >60)
Glucose, Bld: 164 mg/dL — ABNORMAL HIGH (ref 70–99)
Potassium: 4.5 mmol/L (ref 3.5–5.1)
Sodium: 136 mmol/L (ref 135–145)
Total Bilirubin: 1.6 mg/dL — ABNORMAL HIGH (ref 0.3–1.2)
Total Protein: 5.9 g/dL — ABNORMAL LOW (ref 6.5–8.1)

## 2022-05-30 LAB — HEMOGLOBIN A1C
Hgb A1c MFr Bld: 6.2 % — ABNORMAL HIGH (ref 4.8–5.6)
Mean Plasma Glucose: 131.24 mg/dL

## 2022-05-30 MED ORDER — HYDROMORPHONE HCL 1 MG/ML IJ SOLN
1.0000 mg | Freq: Four times a day (QID) | INTRAMUSCULAR | Status: DC | PRN
  Administered 2022-05-30 (×2): 1 mg via INTRAVENOUS
  Filled 2022-05-30 (×2): qty 1

## 2022-05-30 MED ORDER — DIPHENHYDRAMINE HCL 50 MG/ML IJ SOLN
12.5000 mg | Freq: Once | INTRAMUSCULAR | Status: AC
  Administered 2022-05-30: 12.5 mg via INTRAVENOUS
  Filled 2022-05-30: qty 1

## 2022-05-30 MED ORDER — ALLOPURINOL 300 MG PO TABS
150.0000 mg | ORAL_TABLET | Freq: Every day | ORAL | Status: DC
  Administered 2022-05-30 – 2022-06-08 (×10): 150 mg via ORAL
  Filled 2022-05-30: qty 1
  Filled 2022-05-30: qty 2
  Filled 2022-05-30: qty 1.5
  Filled 2022-05-30: qty 1
  Filled 2022-05-30: qty 2
  Filled 2022-05-30: qty 1.5
  Filled 2022-05-30 (×2): qty 1
  Filled 2022-05-30 (×2): qty 1.5

## 2022-05-30 MED ORDER — INSULIN ASPART 100 UNIT/ML IJ SOLN
0.0000 [IU] | Freq: Three times a day (TID) | INTRAMUSCULAR | Status: DC
  Administered 2022-05-30: 3 [IU] via SUBCUTANEOUS
  Administered 2022-05-30: 2 [IU] via SUBCUTANEOUS
  Administered 2022-05-30: 8 [IU] via SUBCUTANEOUS
  Administered 2022-05-31: 3 [IU] via SUBCUTANEOUS
  Administered 2022-05-31: 5 [IU] via SUBCUTANEOUS
  Administered 2022-05-31 – 2022-06-01 (×2): 8 [IU] via SUBCUTANEOUS
  Administered 2022-06-01: 15 [IU] via SUBCUTANEOUS
  Administered 2022-06-01: 2 [IU] via SUBCUTANEOUS
  Administered 2022-06-02: 5 [IU] via SUBCUTANEOUS
  Administered 2022-06-03: 8 [IU] via SUBCUTANEOUS
  Administered 2022-06-03: 5 [IU] via SUBCUTANEOUS
  Administered 2022-06-03: 3 [IU] via SUBCUTANEOUS
  Administered 2022-06-04: 2 [IU] via SUBCUTANEOUS
  Administered 2022-06-04 – 2022-06-05 (×3): 5 [IU] via SUBCUTANEOUS
  Administered 2022-06-05: 3 [IU] via SUBCUTANEOUS
  Administered 2022-06-05: 2 [IU] via SUBCUTANEOUS
  Administered 2022-06-06: 3 [IU] via SUBCUTANEOUS
  Administered 2022-06-06: 5 [IU] via SUBCUTANEOUS
  Administered 2022-06-07 (×2): 2 [IU] via SUBCUTANEOUS
  Administered 2022-06-07 – 2022-06-08 (×2): 3 [IU] via SUBCUTANEOUS
  Filled 2022-05-30 (×24): qty 1

## 2022-05-30 MED ORDER — SODIUM CHLORIDE 0.9% FLUSH
3.0000 mL | Freq: Two times a day (BID) | INTRAVENOUS | Status: DC
  Administered 2022-05-30 – 2022-06-08 (×19): 3 mL via INTRAVENOUS

## 2022-05-30 MED ORDER — SODIUM CHLORIDE 0.9 % IV SOLN: INTRAVENOUS | Status: DC

## 2022-05-30 MED ORDER — HYDROMORPHONE HCL 1 MG/ML IJ SOLN
1.0000 mg | Freq: Four times a day (QID) | INTRAMUSCULAR | Status: DC | PRN
  Administered 2022-05-30 – 2022-06-02 (×4): 1 mg via INTRAVENOUS
  Filled 2022-05-30 (×7): qty 1

## 2022-05-30 MED ORDER — PANTOPRAZOLE SODIUM 40 MG PO TBEC
40.0000 mg | DELAYED_RELEASE_TABLET | Freq: Two times a day (BID) | ORAL | Status: DC
  Administered 2022-05-30 – 2022-06-08 (×17): 40 mg via ORAL
  Filled 2022-05-30 (×18): qty 1

## 2022-05-30 MED ORDER — HYDRALAZINE HCL 20 MG/ML IJ SOLN: 10.0000 mg | Freq: Four times a day (QID) | INTRAMUSCULAR | Status: DC | PRN

## 2022-05-30 MED ORDER — BUSPIRONE HCL 10 MG PO TABS
10.0000 mg | ORAL_TABLET | Freq: Two times a day (BID) | ORAL | Status: DC
  Administered 2022-05-30 – 2022-06-08 (×17): 10 mg via ORAL
  Filled 2022-05-30 (×17): qty 1

## 2022-05-30 MED ORDER — AMIODARONE HCL 200 MG PO TABS
200.0000 mg | ORAL_TABLET | Freq: Every day | ORAL | Status: DC
  Administered 2022-05-30 – 2022-06-08 (×10): 200 mg via ORAL
  Filled 2022-05-30 (×10): qty 1

## 2022-05-30 MED ORDER — DIPHENHYDRAMINE HCL 25 MG PO CAPS: 25.0000 mg | ORAL_CAPSULE | Freq: Four times a day (QID) | ORAL | Status: DC | PRN

## 2022-05-30 MED ORDER — PANTOPRAZOLE SODIUM 40 MG IV SOLR
40.0000 mg | Freq: Two times a day (BID) | INTRAVENOUS | Status: DC
  Administered 2022-05-30 (×2): 40 mg via INTRAVENOUS
  Filled 2022-05-30 (×2): qty 10

## 2022-05-30 MED ORDER — TRAZODONE HCL 100 MG PO TABS
100.0000 mg | ORAL_TABLET | Freq: Every evening | ORAL | Status: DC | PRN
  Administered 2022-05-31 – 2022-06-02 (×3): 100 mg via ORAL
  Filled 2022-05-30 (×3): qty 2

## 2022-05-30 MED ORDER — ACETAMINOPHEN 650 MG RE SUPP: 650.0000 mg | Freq: Four times a day (QID) | RECTAL | Status: DC | PRN

## 2022-05-30 MED ORDER — ACETAMINOPHEN 325 MG PO TABS
650.0000 mg | ORAL_TABLET | Freq: Four times a day (QID) | ORAL | Status: DC | PRN
  Administered 2022-05-30 – 2022-06-08 (×9): 650 mg via ORAL
  Filled 2022-05-30 (×9): qty 2

## 2022-05-30 MED ORDER — OXYCODONE HCL 5 MG PO TABS
5.0000 mg | ORAL_TABLET | Freq: Four times a day (QID) | ORAL | Status: DC | PRN
  Administered 2022-05-30 – 2022-06-01 (×5): 5 mg via ORAL
  Filled 2022-05-30 (×5): qty 1

## 2022-05-30 MED ORDER — DULOXETINE HCL 30 MG PO CPEP
30.0000 mg | ORAL_CAPSULE | Freq: Every day | ORAL | Status: DC
  Administered 2022-05-30 – 2022-06-08 (×10): 30 mg via ORAL
  Filled 2022-05-30 (×10): qty 1

## 2022-05-30 MED ORDER — VITAMIN B-12 1000 MCG PO TABS
1000.0000 ug | ORAL_TABLET | Freq: Every day | ORAL | Status: DC
  Administered 2022-05-31 – 2022-06-08 (×8): 1000 ug via ORAL
  Filled 2022-05-30 (×8): qty 1

## 2022-05-30 MED ORDER — APIXABAN 2.5 MG PO TABS
2.5000 mg | ORAL_TABLET | Freq: Two times a day (BID) | ORAL | Status: DC
  Administered 2022-05-30 (×2): 2.5 mg via ORAL
  Filled 2022-05-30 (×2): qty 1

## 2022-05-30 MED ORDER — LEVOTHYROXINE SODIUM 50 MCG PO TABS
50.0000 ug | ORAL_TABLET | Freq: Every day | ORAL | Status: DC
  Administered 2022-05-30 – 2022-06-08 (×9): 50 ug via ORAL
  Filled 2022-05-30 (×10): qty 1

## 2022-05-30 NOTE — Assessment & Plan Note (Signed)
Hold pravastatin today and recheck tomorrow

## 2022-05-30 NOTE — TOC Progression Note (Signed)
Transition of Care [facility]) - Progression Note    Patient Details  Name: Deborah Obrien MRN: [id number] Date of Birth: 04/22/1941  Transition of Care [facility]) CM/SW Williams, RN Phone Number: 05/30/2022, 2:35 PM  Clinical Narrative:     The patient and family are agreeable to a bedsearch, [place] sent, will review once bed offers obtained, Fl2 completed, PASSR obtained       Expected Discharge Plan and Services                                                 Social Determinants of Health (SDOH) Interventions    Readmission Risk Interventions     No data to display

## 2022-05-30 NOTE — Evaluation (Signed)
Physical Therapy Evaluation Patient Details Name: Deborah Obrien MRN: [id number] DOB: 05/29/1941 Today's Date: 05/30/2022  History of Present Illness  Pt is an 81 y.o. female presenting to hospital 9/17 s/p fall (tripped and fell over her purse while visiting her husband at his nursing facility); hit L arm and c/o severe pain in middle of L UE that moves up into her shoulder; c/o some pain in R knee; also striking her head.  H/o recent falls.  Imaging showing comminuted and angulated fx of the midshaft of the L humerus (spiral displaced fx L humeral shaft).  Plan for non-operative treatment at this time.  Per Dr. Sabra Heck of orthopedics: sugar-tone/coaptation splint placed (and manipulated with improved alignment of humerus).  Pt admitted s/p fall, L humerus fx, and dizziness.  PMH includes htn, DM, CHF, a-fib on Eliquis, CKD, anemia, GERD, gout, pacemaker, stroke, and chronic pain syndrome.  Clinical Impression  Prior to hospital admission, pt was modified independent ambulating with rollator; used w/c for longer distances; lives alone in 1 level home with ramp to enter.  Pt resting in bed upon PT arrival and when therapist introduced herself, pt immediately started crying and noted with increased respiration rate (pt appearing very anxious) stating that she did not want to get OOB again today d/t significant L UE pain concerns.  Discussed current situation with pt and discussed performing LE ex's in bed today and trying to sit on edge of bed tomorrow and pt agreeable.  Tolerated B LE ex's in bed fairly well (pt still appearing anxious).  Once finished with LE ex's (and therapist was going to end session), pt then stating she wanted to try to sit up on edge of bed today so 2nd assist obtained.  Pt stating "stop" or "go" based on her L UE pain and with 2 assist pt able to briefly sit on edge of bed (L UE carefully supported--pillow used under L UE to improve comfort) and pt repositioned in bed for comfort  end of session.  Pt reporting L UE pain was better than this morning (when attempting OOB mobility) but still asking for pain meds end of session (nurse notified and came in pt's room to address needs).  Increased time required for activities d/t pt's L UE pain and pt appearing anxious about pain/situation (pt received pain meds prior to PT session).  Pt would benefit from skilled PT to address noted impairments and functional limitations (see below for any additional details).  Upon hospital discharge, pt would benefit from SNF.    Recommendations for follow up therapy are one component of a multi-disciplinary discharge planning process, led by the attending physician.  Recommendations may be updated based on patient status, additional functional criteria and insurance authorization.  Follow Up Recommendations Skilled nursing-short term rehab (<3 hours/day) Can patient physically be transported by private vehicle: No    Assistance Recommended at Discharge Frequent or constant Supervision/Assistance  Patient can return home with the following  Two people to help with walking and/or transfers;Two people to help with bathing/dressing/bathroom;Assistance with cooking/housework;Assist for transportation;Help with stairs or ramp for entrance    Equipment Recommendations BSC/3in1;Wheelchair (measurements PT);Wheelchair cushion (measurements PT);Hospital bed  Recommendations for Other Services       Functional Status Assessment Patient has had a recent decline in their functional status and demonstrates the ability to make significant improvements in function in a reasonable and predictable amount of time.     Precautions / Restrictions Precautions Precautions: Fall Precaution Comments: Aspiration Required  Braces or Orthoses: Splint/Cast;Sling Splint/Cast: L UE splint in place Restrictions Weight Bearing Restrictions: Yes LUE Weight Bearing: Non weight bearing      Mobility  Bed  Mobility Overal bed mobility: Needs Assistance Bed Mobility: Supine to Sit, Sit to Supine     Supine to sit: Max assist, +2 for physical assistance, HOB elevated Sit to supine: Max assist, +2 for physical assistance, HOB elevated   General bed mobility comments: assist for trunk, B LE's, and for L UE comfort (pt cueing therapist to either "stop" or "go" during bed mobility depending on how she was feeling)    Transfers                   General transfer comment: unable to maintain sitting to attempt d/t L UE pain    Ambulation/Gait                  Stairs            Wheelchair Mobility    Modified Rankin (Stroke Patients Only)       Balance Overall balance assessment: Needs assistance Sitting-balance support: Feet unsupported, No upper extremity supported Sitting balance-Leahy Scale: Poor Sitting balance - Comments: assist to maintain sitting balance d/t L UE pain                                     Pertinent Vitals/Pain Pain Assessment Pain Assessment: 0-10 Pain Score: 10-Worst pain ever Pain Location: L UE/shoulder Pain Descriptors / Indicators: Discomfort, Crying, Grimacing, Aching, Guarding Pain Intervention(s): Limited activity within patient's tolerance, Monitored during session, Repositioned, Premedicated before session, Patient requesting pain meds-RN notified, Other (comment) (Nurse reports she will get pt ice and Tylenol (and received order for Benadryl as well d/t pt c/o itchiness)) HR and O2 on room air stable and WFL throughout treatment session.  Pt with increased RR during session (pt appearing very anxious) requiring cueing to slow breathing intermittently.    Home Living Family/patient expects to be discharged to:: Private residence Living Arrangements: Alone Available Help at Discharge:  (No available assistance at discharge.  Per OT, pt's son reports looking for long term placement for pt.) Type of Home:  House Home Access: Ramped entrance       Home Layout: One level Home Equipment: Rollator (4 wheels);Cane - quad;Wheelchair - manual;Grab bars - tub/shower      Prior Function Prior Level of Function : Independent/Modified Independent;Driving             Mobility Comments: Ambulates with 4ww (uses w/c for longer distances); h/o falls       Hand Dominance        Extremity/Trunk Assessment   Upper Extremity Assessment Upper Extremity Assessment: LUE deficits/detail (R UE generalized weakness) LUE: Unable to fully assess due to immobilization;Unable to fully assess due to pain    Lower Extremity Assessment Lower Extremity Assessment: Generalized weakness    Cervical / Trunk Assessment Cervical / Trunk Assessment:  (forward head/shoulders)  Communication   Communication: No difficulties  Cognition Arousal/Alertness: Awake/alert Behavior During Therapy: Anxious Overall Cognitive Status: Within Functional Limits for tasks assessed                                          General Comments      Exercises  Total Joint Exercises Ankle Circles/Pumps: AROM, Strengthening, Both, 10 reps, Supine Quad Sets: AROM, Strengthening, Both, 10 reps, Supine Heel Slides: AROM, Strengthening, Both, 10 reps, Supine Hip ABduction/ADduction: AAROM, Strengthening, Both, 10 reps, Supine   Assessment/Plan    PT Assessment Patient needs continued PT services  PT Problem List Decreased strength;Decreased range of motion;Decreased activity tolerance;Decreased balance;Decreased mobility;Decreased knowledge of use of DME;Decreased knowledge of precautions;Pain       PT Treatment Interventions DME instruction;Gait training;Functional mobility training;Therapeutic activities;Therapeutic exercise;Balance training;Patient/family education    PT Goals (Current goals can be found in the Care Plan section)  Acute Rehab PT Goals Patient Stated Goal: to improve pain and  mobility PT Goal Formulation: With patient Time For Goal Achievement: 06/13/22 Potential to Achieve Goals: Fair    Frequency 7X/week     Co-evaluation               AM-PAC PT "6 Clicks" Mobility  Outcome Measure Help needed turning from your back to your side while in a flat bed without using bedrails?: Total Help needed moving from lying on your back to sitting on the side of a flat bed without using bedrails?: Total Help needed moving to and from a bed to a chair (including a wheelchair)?: Total Help needed standing up from a chair using your arms (e.g., wheelchair or bedside chair)?: Total Help needed to walk in hospital room?: Total Help needed climbing 3-5 steps with a railing? : Total 6 Click Score: 6    End of Session   Activity Tolerance: Patient limited by pain Patient left: in bed;with call bell/phone within reach;with bed alarm set;with family/visitor present;with SCD's reapplied Nurse Communication: Mobility status;Patient requests pain meds;Precautions;Weight bearing status PT Visit Diagnosis: Other abnormalities of gait and mobility (R26.89);Muscle weakness (generalized) (M62.81);History of falling (Z91.81);Pain Pain - Right/Left: Left Pain - part of body: Arm    Time: 4166-0630 PT Time Calculation (min) (ACUTE ONLY): 49 min   Charges:   PT Evaluation $PT Eval Low Complexity: 1 Low PT Treatments $Therapeutic Exercise: 8-22 mins $Therapeutic Activity: 8-22 mins       Leitha Bleak, PT 05/30/22, 3:40 PM

## 2022-05-30 NOTE — Assessment & Plan Note (Addendum)
Continue levothyroxine 

## 2022-05-30 NOTE — NC FL2 (Signed)
[place] LEVEL OF CARE SCREENING TOOL     IDENTIFICATION  Patient Name: Deborah Obrien Birthdate: [birth year] Sex: female Admission Date (Current Location): 05/29/2022  [facility] and [place] Number:  Engineering geologist and Address:  [facility], [address] [place], [place] [address]      Provider Number: [id number]  Attending Physician Name and Address:  Loletha Grayer, MD  Relative Name and Phone Number:  Waymon Budge [phone]    Current Level of Care: Hospital Recommended Level of Care: [place] Prior Approval Number:    Date Approved/Denied:   PASRR Number: [id number]  Discharge Plan: SNF    Current Diagnoses: Patient Active Problem List   Diagnosis Date Noted   Hypothyroidism 05/30/2022   Acute kidney injury superimposed on chronic kidney disease ([place]) 05/30/2022   Drop in hemoglobin 05/30/2022   Elevated liver function tests 05/30/2022   History of gout 05/30/2022   Humerus fracture 05/29/2022   Abnormal urinalysis 03/05/2022   Hypoglycemia 03/04/2022   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[place]) 03/02/2022   Osteoporosis 02/13/2020   Primary osteoarthritis of left shoulder 11/10/2019   Uses walker 09/27/2018   Chronic anticoagulation 04/09/2018   H/O TIA (transient ischemic attack) and stroke 04/09/2018   CAD (coronary artery disease) 04/09/2018   Dizziness 06/12/2017   Chronic kidney disease, stage 4 (severe) ([place]) 01/09/2017   Nephrolithiasis 02/22/2016   Diabetic neuropathy with neurologic complication ([place]) 59/56/3875   Recurrent major depressive disorder, in remission ([place]) 08/11/2015   Polyp of stomach and duodenum 07/29/2015   Gastric polyp    Gastro-esophageal reflux disease without esophagitis    Permanent atrial fibrillation ([place]) 06/29/2015   Intestinal obstruction ([place]) 02/20/2015   DDD (degenerative disc disease), lumbar 01/19/2015   Back pain  01/12/2015   Gout 01/12/2015   Intervertebral disc disorder with radiculopathy of lumbar region 09/17/2014   Lumbar radiculitis 09/17/2014   Lumbar stenosis with neurogenic claudication 09/17/2014   Neuritis or radiculitis due to rupture of lumbar intervertebral disc 09/17/2014   Atherosclerosis of both carotid arteries 07/15/2014   Hyperlipemia, mixed 07/15/2014   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   Bilateral carotid artery disease ([place]) 07/15/2014   Body mass index (BMI) of 40.0-44.9 in [facility]) 06/21/2014   Mixed urge and stress incontinence 06/25/2013   Nocturia 06/25/2013   Blurring of visual image 04/11/2012   Other visual disturbances 04/11/2012    Orientation RESPIRATION BLADDER Height & Weight     Self, Time, Situation, Place  Normal Continent Weight: 78.8 kg Height:  5\' 3"  (160 cm)  BEHAVIORAL SYMPTOMS/MOOD NEUROLOGICAL BOWEL NUTRITION STATUS      Continent Diet (see dc summary)  AMBULATORY STATUS COMMUNICATION OF NEEDS Skin   Extensive Assist Verbally Normal                       Personal Care Assistance Level of Assistance  Bathing, Feeding, Dressing Bathing Assistance: Limited assistance Feeding assistance: Independent Dressing Assistance: Maximum assistance     Functional Limitations Info             SPECIAL CARE FACTORS FREQUENCY  PT (By licensed PT), OT (By licensed OT)     PT Frequency: 5 times per week OT Frequency: 5 times per week            Contractures Contractures Info: Not present    Additional Factors Info  Code Status, Allergies Code Status Info: full code Allergies Info: Fish  Allergy, Macrolides And Ketolides, Meperidine, Other, Prednisone, Shellfish Allergy, Sulfa Antibiotics, Sulfacetamide Sodium, Telbivudine, Uloric (Febuxostat), Aspirin, Celecoxib, Cephalexin, Codeine, Dilaudid (Hydromorphone Hcl), Erythromycin, Iodinated Contrast Media, Lipitor (Atorvastatin Calcium), Oxycodone, Aleve (Naproxen), Atorvastatin,  Cephalosporins, Doxycycline, Iodine, Motrin (Ibuprofen), Tape, Valdecoxib           Current Medications (05/30/2022):  This is the current hospital active medication list Current Facility-Administered Medications  Medication Dose Route Frequency Provider Last Rate Last Admin   acetaminophen (TYLENOL) tablet 650 mg  650 mg Oral Q6H PRN Para Skeans, MD   650 mg at 05/30/22 2820   Or   acetaminophen (TYLENOL) suppository 650 mg  650 mg Rectal Q6H PRN Para Skeans, MD       allopurinol (ZYLOPRIM) tablet 150 mg  150 mg Oral Daily Wieting, Richard, MD       amiodarone (PACERONE) tablet 200 mg  200 mg Oral Daily Florina Ou V, MD   200 mg at 05/30/22 0915   busPIRone (BUSPAR) tablet 10 mg  10 mg Oral BID Loletha Grayer, MD       Derrill Memo ON 05/31/2022] cyanocobalamin (VITAMIN B12) tablet 1,000 mcg  1,000 mcg Oral Daily Wieting, Richard, MD       DULoxetine (CYMBALTA) DR capsule 30 mg  30 mg Oral Daily Florina Ou V, MD   30 mg at 05/30/22 0915   hydrALAZINE (APRESOLINE) injection 10 mg  10 mg Intravenous Q6H PRN Para Skeans, MD       HYDROmorphone (DILAUDID) injection 1 mg  1 mg Intravenous Q6H PRN Wieting, Richard, MD       insulin aspart (novoLOG) injection 0-15 Units  0-15 Units Subcutaneous TID WC Para Skeans, MD   8 Units at 05/30/22 1312   levothyroxine (SYNTHROID) tablet 50 mcg  50 mcg Oral Q0600 Para Skeans, MD   50 mcg at 05/30/22 0518   oxyCODONE (Oxy IR/ROXICODONE) immediate release tablet 5 mg  5 mg Oral Q6H PRN Loletha Grayer, MD   5 mg at 05/30/22 1337   pantoprazole (PROTONIX) EC tablet 40 mg  40 mg Oral BID Dorothe Pea, RPH       sodium chloride flush (NS) 0.9 % injection 3 mL  3 mL Intravenous Q12H Para Skeans, MD   3 mL at 05/30/22 0917   traZODone (DESYREL) tablet 100 mg  100 mg Oral QHS PRN Loletha Grayer, MD         Discharge Medications: Please see discharge summary for a list of discharge medications.  Relevant Imaging Results:  Relevant Lab  Results:   Additional Information SSN [id number]  Conception Oms, RN

## 2022-05-30 NOTE — Assessment & Plan Note (Addendum)
Hemoglobin went from 11.4 down to 9.4.  We will hold Eliquis with head trauma and humerus fracture.  We will send off a ferritin tomorrow morning to further classify anemia.

## 2022-05-30 NOTE — Assessment & Plan Note (Addendum)
Acute kidney injury on CKD stage IIIa.  Creatinine 1.72 on presentation baseline 1.4.  Today's creatinine 1.64

## 2022-05-30 NOTE — Consult Note (Signed)
ORTHOPAEDIC CONSULTATION  REQUESTING PHYSICIAN: Loletha Grayer, MD  Chief Complaint: Left arm pain  HPI: Deborah Obrien is a 81 y.o. female who complains of left arm pain after a fall last night at home.  She was seen in the emergency room where exam and x-rays revealed a long spiral displaced fracture of the left humeral shaft.  She was treated with coaptation splint and sling with some compression applied.  X-ray showed improved and position after that.  She has been admitted for pain control and possible placement.  No other injuries were noted orthopedically.  Past Medical History:  Diagnosis Date   A-fib ([place]) 2016   Acid reflux    Acute respiratory failure ([place]) 04/09/2018   Anemia    Anxiety    Atrial flutter ([place]) 08/24/2017   Bowel obstruction (HCC)    Carotid artery disease (HCC)    Chronic kidney disease (CKD) stage G3a/A1, moderately decreased glomerular filtration rate (GFR) between 45-59 mL/min/1.73 square meter and albuminuria creatinine ratio less than 30 mg/g (HCC)    stage IV   Closed fracture of one rib of right side 05/29/2020   Complete tear of left rotator cuff 10/16/2014   Coronary artery disease    Depression    Diabetes mellitus without complication ([place])    Diarrhea 03/04/2022   Dyspnea    Dysrhythmia    PAROXYSMAL ATRIAL FIBRILLATION   Fatty liver    Gastritis without bleeding    GI bleed    Gout    Hypertension    Hypothyroidism    Low back pain    history of kyphoplasty t12-l1   Morbid obesity ([place])    Multiple gastric polyps    history of gi bleed.  polyps removed   Neuromuscular disorder ([place])    diabetic neuropathy   Osteoarthritis    Osteoporosis    Pacemaker 04/09/2018   MDT- upgraded to duel chamber device Dec 2018- lead revision post upgrade dec 2018   Paroxysmal atrial fibrillation ([place])    Presence of permanent cardiac pacemaker    Sinus node dysfunction ([place]) 08/24/2017   Stroke ([place])    TIA, patient unaware that she had  it. no residual   Tendinitis of upper biceps tendon of left shoulder 11/10/2019   TIA (transient ischemic attack)    Type 2 diabetes mellitus with neurological complications ([place]) 6/94/8546   Uncontrolled type 2 diabetes mellitus with hypoglycemia, with long-term current use of insulin ([place]) 03/04/2022   Past Surgical History:  Procedure Laterality Date   [place]     2008. plate & screws    BACK SURGERY  2019   patient describes kyphoplasty for compression fractures, MD referral said fusion   BREAST BIOPSY Right 08/16   stereo fibroadenomatous change, neg for atypia   BREAST EXCISIONAL BIOPSY Left    neg   CARDIAC CATHETERIZATION Left 08/25/2016   Procedure: Left Heart Cath and Coronary Angiography;  Surgeon: Corey Skains, MD;  Location: [place] [facility];  Service: Cardiovascular;  Laterality: Left;   CARDIOVERSION N/A 06/28/2017   Procedure: CARDIOVERSION;  Surgeon: Corey Skains, MD;  Location: [facility] ORS;  Service: Cardiovascular;  Laterality: N/A;   CARDIOVERSION N/A 02/06/2018   Procedure: CARDIOVERSION;  Surgeon: Josue Hector, MD;  Location: [facility] ENDOSCOPY;  Service: Cardiovascular;  Laterality: N/A;   CARDIOVERSION N/A 07/15/2020   Procedure: CARDIOVERSION;  Surgeon: Jerline Pain, MD;  Location: [place] ENDOSCOPY;  Service: Cardiovascular;  Laterality: N/A;   CATARACT EXTRACTION, BILATERAL  2012   CHOLECYSTECTOMY     colon blockage  1999   COLON SURGERY  2000   removed 20% of colon. colon had collapsed   COLONOSCOPY  2016   polyps removed 2016   DORSAL COMPARTMENT RELEASE Left 09/14/2016   Procedure: RELEASE DORSAL COMPARTMENT (DEQUERVAIN);  Surgeon: Dereck Leep, MD;  Location: [facility] ORS;  Service: Orthopedics;  Laterality: Left;   ESOPHAGOGASTRODUODENOSCOPY N/A 01/27/2015   Procedure: ESOPHAGOGASTRODUODENOSCOPY (EGD);  Surgeon: Lollie Sails, MD;  Location: [facility] ENDOSCOPY;  Service: Endoscopy;  Laterality: N/A;    ESOPHAGOGASTRODUODENOSCOPY (EGD) WITH PROPOFOL N/A 07/24/2015   Procedure: ESOPHAGOGASTRODUODENOSCOPY (EGD) WITH PROPOFOL;  Surgeon: Lucilla Lame, MD;  Location: [facility] ENDOSCOPY;  Service: Endoscopy;  Laterality: N/A;   ESOPHAGOGASTRODUODENOSCOPY (EGD) WITH PROPOFOL N/A 04/12/2018   Procedure: ESOPHAGOGASTRODUODENOSCOPY (EGD) WITH PROPOFOL;  Surgeon: Jonathon Bellows, MD;  Location: [facility] ENDOSCOPY;  Service: Gastroenterology;  Laterality: N/A;   ESOPHAGOGASTRODUODENOSCOPY (EGD) WITH PROPOFOL N/A 06/22/2018   Procedure: ESOPHAGOGASTRODUODENOSCOPY (EGD) WITH PROPOFOL;  Surgeon: [name], MD;  Location: [facility] ENDOSCOPY;  Service: Endoscopy;  Laterality: N/A;   ESOPHAGOGASTRODUODENOSCOPY (EGD) WITH PROPOFOL N/A 07/09/2018   Procedure: ESOPHAGOGASTRODUODENOSCOPY (EGD) WITH PROPOFOL with resection of gastric polyps;  Surgeon: Jonathon Bellows, MD;  Location: [name] ENDOSCOPY;  Service: Gastroenterology;  Laterality: N/A;   ESOPHAGOGASTRODUODENOSCOPY (EGD) WITH PROPOFOL N/A 05/17/2019   Procedure: ESOPHAGOGASTRODUODENOSCOPY (EGD) WITH PROPOFOL;  Surgeon: Jonathon Bellows, MD;  Location: [facility] ENDOSCOPY;  Service: Gastroenterology;  Laterality: N/A;   EUS N/A 06/22/2018   Procedure: UPPER ENDOSCOPIC ULTRASOUND (EUS) RADIAL;  Surgeon: [name], MD;  Location: [facility] ENDOSCOPY;  Service: Endoscopy;  Laterality: N/A;   EYE SURGERY Bilateral    cataract extractions   JOINT REPLACEMENT Bilateral 2014   Bilateral Knee replacement   LEAD REVISION/REPAIR N/A 08/29/2017   Procedure: LEAD REVISION/REPAIR;  Surgeon: Constance Haw, MD;  Location: [place] [facility];  Service: Cardiovascular;  Laterality: N/A;   OOPHORECTOMY     PACEMAKER INSERTION Left 07/01/2015   Procedure: INSERTION PACEMAKER;  Surgeon: Isaias Cowman, MD;  Location: [facility] ORS;  Service: Cardiovascular;  Laterality: Left;   PACEMAKER REVISION N/A 08/28/2017   Procedure: PACEMAKER REVISION;  Surgeon: Deboraha Sprang, MD;  Location: [place] [facility];  Service: Cardiovascular;  Laterality: N/A;   REPLACEMENT TOTAL KNEE BILATERAL     SHOULDER ARTHROSCOPY WITH ROTATOR CUFF REPAIR AND OPEN BICEPS TENODESIS Left 11/21/2019   Procedure: LEFT SHOULDER ARTHROSCOPY WITH DEBRIDEMENT, DECOMPRESSION, ROTATOR CUFF REPAIR AND BICEPS TENOLYSIS;  Surgeon: Corky Mull, MD;  Location: [facility] ORS;  Service: Orthopedics;  Laterality: Left;   SHOULDER ARTHROSCOPY WITH SUBACROMIAL DECOMPRESSION Left 2013   TEE WITHOUT CARDIOVERSION N/A 06/28/2017   Procedure: TRANSESOPHAGEAL ECHOCARDIOGRAM (TEE);  Surgeon: Corey Skains, MD;  Location: [facility] ORS;  Service: Cardiovascular;  Laterality: N/A;   Social History   Socioeconomic History   Marital status: Married    Spouse name: [name]   Number of children: Not on file   Years of education: Not on file   Highest education level: Not on file  Occupational History   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    Comment: retired  Tobacco Use   Smoking status: Never   Smokeless tobacco: Never  Vaping Use   Vaping Use: Never used  Substance and Sexual Activity   Alcohol use: No   Drug use: No   Sexual activity: Yes  Other Topics Concern   Not on  file  Social History Narrative   DIABETIC   PACEMAKER   Social Determinants of Health   Financial Resource Strain: Not on file  Food Insecurity: Not on file  Transportation Needs: Not on file  Physical Activity: Not on file  Stress: Not on file  Social Connections: Not on file   Family History  Problem Relation Age of Onset   Diabetes Mellitus II Mother    CAD Mother    Heart attack Mother    Cancer Father        skin   Breast cancer Neg Hx    Allergies  Allergen Reactions   Fish Allergy Shortness Of Breath    All kinds of fish. Severe vomitting even with smell of fish.    Macrolides And Ketolides Other (See Comments)    Severe stomach pain (mycins)   Meperidine Shortness Of Breath, Nausea Only and Other (See Comments)    Stomach pain    Other  Nausea Only, Swelling, Rash, Anaphylaxis, Diarrhea, Shortness Of Breath and Other (See Comments)    Allergen: NON-STEROIDS, bextra - hands and feet swell  Reaction:  Stomach pain   Other Reaction: Intolerance   Prednisone Anaphylaxis and Other (See Comments)    (facial swelling/redness/burning)Increases pts blood sugar  Pt states that she is allergic to all steroids.     Shellfish Allergy Anaphylaxis, Shortness Of Breath, Diarrhea, Nausea Only and Rash    Stomach pain    Sulfa Antibiotics Shortness Of Breath, Diarrhea, Nausea Only and Other (See Comments)    Reaction:  Stomach pain   Reaction:  Stomach pain  Other Reaction: Intolerance   Sulfacetamide Sodium Diarrhea, Nausea Only, Other (See Comments) and Shortness Of Breath    Reaction:  Stomach pain    Telbivudine Other (See Comments)    Unknown reaction   Uloric [Febuxostat] Anaphylaxis    Locks pt's body up    Aspirin Rash and Other (See Comments)    Stomach pain (tolerates lower doses)   Celecoxib Other (See Comments)    GI bleed, weakness, and stomach pain.     Cephalexin Diarrhea, Nausea Only and Other (See Comments)    stomach pain    Codeine Diarrhea, Nausea Only and Other (See Comments)    Reaction:  Stomach pain Pt tolerates morphine    Dilaudid [Hydromorphone Hcl] Other (See Comments)    Reactions: easy to overdose - blood pressure drops really low   Erythromycin Diarrhea, Nausea Only and Other (See Comments)    Reaction:  Stomach pain    Iodinated Contrast Media Rash and Other (See Comments)    Pt states that she is unable to have because she has chronic kidney disease.   CKD Pt states that she is unable to have because she has chronic kidney disease.   CKD    Lipitor [Atorvastatin Calcium] Nausea Only    Reaction: nausea, weakness, pass blood   Oxycodone Other (See Comments)    Reaction:  Stomach pain    Aleve [Naproxen]     Reaction: severe stomach pain   Atorvastatin Nausea Only and Other (See Comments)     Weakness    Cephalosporins Other (See Comments)    Reaction:  Unknown    Doxycycline     Stomach pain    Iodine Rash   Motrin [Ibuprofen]     Reaction: severe stomach pain   Tape Other (See Comments)    Causes pts skin to tear  Pt states that she is able to use paper  tape.       Valdecoxib Swelling and Other (See Comments)    Pt states that her hands and feet swell.     Prior to Admission medications   Medication Sig Start Date End Date Taking? Authorizing Provider  allopurinol (ZYLOPRIM) 100 MG tablet Take 200 mg by mouth daily.    [provider]  amiodarone (PACERONE) 200 MG tablet TAKE 1 TABLET BY MOUTH ONCE DAILY . APPOINTMENT REQUIRED FOR FUTURE REFILLS 02/14/22   Deboraha Sprang, MD  apixaban (ELIQUIS) 2.5 MG TABS tablet Take 1 tablet (2.5 mg total) by mouth 2 (two) times daily. 03/06/22   Emeterio Reeve, DO  busPIRone (BUSPAR) 10 MG tablet Take 10 mg by mouth 2 (two) times daily.     [provider]  cholecalciferol (VITAMIN D3) 10 MCG (400 UNIT) TABS tablet Take 400 Units by mouth in the morning and at bedtime.    [provider]  docusate sodium (COLACE) 100 MG capsule Take 200 mg by mouth 2 (two) times daily.    [provider]  DULoxetine (CYMBALTA) 30 MG capsule Take 30 mg by mouth daily.  03/25/19   [provider]  furosemide (LASIX) 20 MG tablet Take 2 tablets (40 mg) by mouth once daily In the morning    [provider]  gabapentin (NEURONTIN) 300 MG capsule Take 1 capsule (300 mg) by mouth three times a day    [provider]  HYDROcodone-acetaminophen (NORCO) 10-325 MG tablet Take 1 tablet by mouth every 4 (four) hours as needed for moderate pain. Patient not taking: Reported on 03/04/2022 11/21/19   Poggi, Marshall Cork, MD  insulin aspart (NOVOLOG) 100 UNIT/ML injection 0-12 Units, Subcutaneous, 3 times daily with meals Check your sugars before meal time and take the insulin as follows based on the sugar  readings. CBG < 70: NO INSULIN: eat a snack and recheck sugar soon CBG 70 - 120: 3 units CBG 121 - 150: 4 unit CBG 151 - 200: 5 units CBG 201 - 250: 7 units CBG 251 - 300: 8 units CBG 301 - 350: 10 units CBG 351 - 400: 12 units CBG > 400: call MD 03/06/22   Emeterio Reeve, DO  insulin glargine-yfgn (SEMGLEE) 100 UNIT/ML injection Inject 0.14 mLs (14 Units total) into the skin daily. 03/07/22   Emeterio Reeve, DO  ketoconazole (NIZORAL) 2 % cream Apply 1 application topically daily as needed (rash/skin irritation.).     [provider]  levothyroxine (SYNTHROID) 50 MCG tablet Take 50 mcg by mouth daily before breakfast.    [provider]  omeprazole (PRILOSEC) 20 MG capsule TAKE 1 CAPSULE BY MOUTH TWICE DAILY BEFORE A MEAL 02/14/22   Vanga, Tally Due, MD  oxybutynin (DITROPAN) 5 MG tablet Take 10 mg by mouth 2 (two) times daily. Breakfast & bedtime    [provider]  Polyethyl Glycol-Propyl Glycol 0.4-0.3 % SOLN Place 1-2 drops into both eyes 4 (four) times daily as needed (dry eyes).     [provider]  pravastatin (PRAVACHOL) 20 MG tablet Take 20 mg by mouth at bedtime.  07/25/18   [provider]  Prenat-FeCbn-FeBisg-FA-Omega (MULTIVITAMIN/MINERALS PO) Take 1 tablet by mouth daily.    [provider]  talc powder Apply 1 application topically as needed (under the breast irritation).    [provider]  traMADol (ULTRAM) 50 MG tablet Take 50 mg by mouth every 6 (six) hours as needed. Patient not taking: Reported on 03/04/2022 03/18/21  [provider]  traZODone (DESYREL) 50 MG tablet Take 50 mg by mouth at bedtime.  06/07/18   [provider]  vitamin B-12 (CYANOCOBALAMIN) 1000 MCG tablet Take 1,000 mcg by mouth daily.    [provider]  vitamin C (ASCORBIC ACID) 500 MG tablet Take 500 mg by mouth 2 (two) times daily. Patient not taking: Reported on 03/29/2022    [provider]   DG  Humerus Left  Result Date: 05/30/2022 CLINICAL DATA:  Inpatient encounter for new onset pain of left humerus. Patient reports hearing a popping sound. EXAM: LEFT HUMERUS - 2+ VIEW COMPARISON:  Left humerus radiographs 05/29/2022 FINDINGS: There is again overlying cast material limits evaluation of fine bony detail. There is again an oblique, partial spiral fracture of the proximal and mid diaphysis of the left humerus. Unchanged moderate displacement and minimal lateral apex angulation. There are curvilinear lucencies within the superior aspect of the humeral head suspicious for possible additional nondisplaced fracture lines. 1 mm superomedial humeral head cortical step-off. Mild inferior glenoid partially visualized degenerative osteophytosis. Moderate acromioclavicular joint space narrowing with mild peripheral degenerative osteophytosis. Partial visualization of left chest wall cardiac pacer. IMPRESSION: No significant change in moderate displacement and minimal lateral apex angulation of acute to subacute oblique, partial spiral fracture of the proximal mid diaphysis of the left humerus. Electronically Signed   By: Yvonne Kendall M.D.   On: 05/30/2022 10:56   DG Humerus Left  Result Date: 05/29/2022 CLINICAL DATA:  Status post reduction EXAM: LEFT HUMERUS - 2+ VIEW COMPARISON:  Films from earlier in the same day. FINDINGS: Midshaft left humeral fracture is again identified. The degree of displacement has been reduced slightly. No new focal abnormality is noted. IMPRESSION: Status post slight reduction in the mid left humeral fracture. Electronically Signed   By: Inez Catalina M.D.   On: 05/29/2022 22:54   CT Head Wo Contrast  Result Date: 05/29/2022 CLINICAL DATA:  Head trauma, minor (Age >= 65y); Neck trauma (Age >= 65y) EXAM: CT HEAD WITHOUT CONTRAST CT CERVICAL SPINE WITHOUT CONTRAST TECHNIQUE: Multidetector CT imaging of the head and cervical spine was performed following the standard protocol  without intravenous contrast. Multiplanar CT image reconstructions of the cervical spine were also generated. RADIATION DOSE REDUCTION: This exam was performed according to the departmental dose-optimization program which includes automated exposure control, adjustment of the mA and/or kV according to patient size and/or use of iterative reconstruction technique. COMPARISON:  None Available. FINDINGS: CT HEAD FINDINGS BRAIN: BRAIN Patchy and confluent areas of decreased attenuation are noted throughout the deep and periventricular white matter of the cerebral hemispheres bilaterally, compatible with chronic microvascular ischemic disease. No evidence of large-territorial acute infarction. No parenchymal hemorrhage. No mass lesion. No extra-axial collection. No mass effect or midline shift. No hydrocephalus. Basilar cisterns are patent. Vascular: No hyperdense vessel. Skull: No acute fracture or focal lesion. Hyperostosis frontalis interna. Sinuses/Orbits: Paranasal sinuses and mastoid air cells are clear. Bilateral lens replacement. Otherwise the orbits are unremarkable. Other: Trace right frontal scalp hematoma formation (3 mm). CT CERVICAL SPINE FINDINGS Alignment: Normal. Skull base and vertebrae: Multilevel mild-to-moderate degenerative changes of the spine. C6-C7 anterior cervical discectomy and fusion. No acute fracture. No aggressive appearing focal osseous lesion or focal pathologic process. Soft tissues and spinal canal: No prevertebral fluid or swelling. No visible canal hematoma. Upper chest: Trace biapical pleural/pulmonary scarring. Other: None. IMPRESSION: 1. No acute intracranial abnormality. 2. No acute displaced fracture or traumatic listhesis of the cervical spine  in a patient status post C6-C7 anterior cervical discectomy and fusion. Electronically Signed   By: Iven Finn M.D.   On: 05/29/2022 19:25   CT Cervical Spine Wo Contrast  Result Date: 05/29/2022 CLINICAL DATA:  Head trauma,  minor (Age >= 65y); Neck trauma (Age >= 65y) EXAM: CT HEAD WITHOUT CONTRAST CT CERVICAL SPINE WITHOUT CONTRAST TECHNIQUE: Multidetector CT imaging of the head and cervical spine was performed following the standard protocol without intravenous contrast. Multiplanar CT image reconstructions of the cervical spine were also generated. RADIATION DOSE REDUCTION: This exam was performed according to the departmental dose-optimization program which includes automated exposure control, adjustment of the mA and/or kV according to patient size and/or use of iterative reconstruction technique. COMPARISON:  None Available. FINDINGS: CT HEAD FINDINGS BRAIN: BRAIN Patchy and confluent areas of decreased attenuation are noted throughout the deep and periventricular white matter of the cerebral hemispheres bilaterally, compatible with chronic microvascular ischemic disease. No evidence of large-territorial acute infarction. No parenchymal hemorrhage. No mass lesion. No extra-axial collection. No mass effect or midline shift. No hydrocephalus. Basilar cisterns are patent. Vascular: No hyperdense vessel. Skull: No acute fracture or focal lesion. Hyperostosis frontalis interna. Sinuses/Orbits: Paranasal sinuses and mastoid air cells are clear. Bilateral lens replacement. Otherwise the orbits are unremarkable. Other: Trace right frontal scalp hematoma formation (3 mm). CT CERVICAL SPINE FINDINGS Alignment: Normal. Skull base and vertebrae: Multilevel mild-to-moderate degenerative changes of the spine. C6-C7 anterior cervical discectomy and fusion. No acute fracture. No aggressive appearing focal osseous lesion or focal pathologic process. Soft tissues and spinal canal: No prevertebral fluid or swelling. No visible canal hematoma. Upper chest: Trace biapical pleural/pulmonary scarring. Other: None. IMPRESSION: 1. No acute intracranial abnormality. 2. No acute displaced fracture or traumatic listhesis of the cervical spine in a patient  status post C6-C7 anterior cervical discectomy and fusion. Electronically Signed   By: Iven Finn M.D.   On: 05/29/2022 19:25   DG Chest Portable 1 View  Result Date: 05/29/2022 CLINICAL DATA:  Fall. EXAM: PORTABLE CHEST 1 VIEW COMPARISON:  None Available. FINDINGS: The heart size and mediastinal contours are within normal limits. Atherosclerotic calcification of the aortic arch. Left IJ access pacemaker with leads terminating in the right atrium and right ventricle. Both lungs are clear without evidence of focal consolidation or pleural effusion. Partially imaged fracture of the left humerus. IMPRESSION: No acute cardiopulmonary process. Partially imaged left humeral comminuted fracture. Electronically Signed   By: Keane Police D.O.   On: 05/29/2022 15:41   DG Knee 2 Views Right  Result Date: 05/29/2022 CLINICAL DATA:  Fall. EXAM: RIGHT KNEE - 1-2 VIEW COMPARISON:  None Available. FINDINGS: Status post right knee arthroplasty. The hardware is intact. No perihardware loosening. No acute fracture or dislocation. Soft tissues are unremarkable. IMPRESSION: No acute osseous abnormality. Electronically Signed   By: Keane Police D.O.   On: 05/29/2022 15:39   DG Humerus Left  Result Date: 05/29/2022 CLINICAL DATA:  Tripped and fell, left arm deformity EXAM: LEFT HUMERUS - 2+ VIEW COMPARISON:  12/22/2014 FINDINGS: Frontal and lateral views of the left humerus are obtained. There is a comminuted fracture at the junction of the proximal and middle third of the humeral diaphysis, with mild displacement and varus angulation at the fracture site. Alignment of the left shoulder and elbow remains anatomic. Diffuse soft tissue swelling. IMPRESSION: 1. Comminuted left humeral diaphyseal fracture, mild displacement and varus angulation as above. Electronically Signed   By: Diana Eves.D.  On: 05/29/2022 15:39    Positive ROS: All other systems have been reviewed and were otherwise negative with the exception  of those mentioned in the HPI and as above.  Physical Exam: General: Alert, no acute distress Cardiovascular: No pedal edema Respiratory: No cyanosis, no use of accessory musculature GI: No organomegaly, abdomen is soft and non-tender Skin: No lesions in the area of chief complaint Neurologic: Sensation intact distally Psychiatric: Patient is competent for consent with normal mood and affect Lymphatic: No axillary or cervical lymphadenopathy  MUSCULOSKELETAL: Left arm in a well-padded coaptation splint.  Mild swelling in the hand.  Patient has limited flexion of the fingers pre-existing this injury.  Neurovascular status is good.  Mild bruising over the right forehead region.  No other injuries are noted.  Assessment: Spiral displaced fracture left humeral shaft  Plan: Plan for nonoperative treatment at this time. Try and elevate it if possible. Encourage flexion and compression of the left hand and arm muscles Follow-up in my office in 1 week after discharge    Park Breed, MD [phone]   05/30/2022 12:39 PM

## 2022-05-30 NOTE — Assessment & Plan Note (Addendum)
Continue to monitor

## 2022-05-30 NOTE — Assessment & Plan Note (Signed)
Continue allopurinol 

## 2022-05-30 NOTE — Evaluation (Signed)
Occupational Therapy Evaluation Patient Details Name: Deborah Obrien MRN: [id number] DOB: 05/16/1941 Today's Date: 05/30/2022   History of Present Illness Deborah Obrien is an 81 y.o. female seen today for fall  when she tripped and fell over her purse at her husbands SNF.  Severe shoulder pain on arrival and found to have fracture.   Clinical Impression   Patient presenting with decreased independence in self-care, functional mobility, safety, and endurance. Pt's son in room and stated pt has no assistance once discharged home and that they are looking for long-term placement due to history of falls. Pt stated she drives and was independent with bathing and dressing tasks. She has a shower chair, regular toilet, QC, WC, and a rollator. Pt refused any functional mobility and ADL tasks. Pt stated her shoulder pain was a "12". Pt currently functioning at max A for bed mobility to scoot up in bed due to increased pain of shoulder. Once scooted up in bed pt stated "my shoulder popped." No pop was heard, possibly anxiety provoked. Nurse and MD notified.  Pt's shoulder dressing very loose upon arrival and not providing much support for limb. Pt placed in chair position, bed alarm set, call bell in reach and family in room. Patient will benefit from acute OT to increase overall independence in the areas of ADLs, functional mobility,  in order to safely discharge to the next venue of care.       Recommendations for follow up therapy are one component of a multi-disciplinary discharge planning process, led by the attending physician.  Recommendations may be updated based on patient status, additional functional criteria and insurance authorization.   Follow Up Recommendations  Skilled nursing-short term rehab (<3 hours/day)    Assistance Recommended at Discharge Intermittent Supervision/Assistance  Patient can return home with the following A little help with walking and/or transfers;A little help  with bathing/dressing/bathroom;Assist for transportation;Assistance with cooking/housework    Functional Status Assessment  Patient has had a recent decline in their functional status and demonstrates the ability to make significant improvements in function in a reasonable and predictable amount of time.  Equipment Recommendations  Other (comment) (Defer to next venue of care.)    Recommendations for Other Services       Precautions / Restrictions Precautions Precautions: None Restrictions Weight Bearing Restrictions: No      Mobility Bed Mobility Overal bed mobility: Needs Assistance             General bed mobility comments: Max A for scooting up in bed.    Transfers                          Balance       Sitting balance - Comments: Pt refuesed                       High Level Balance Comments: Pt refused           ADL either performed or assessed with clinical judgement   ADL Overall ADL's : Needs assistance/impaired                                       General ADL Comments: Pt refuesed any ADL tasks due to pain.     Vision Baseline Vision/History: 1 Wears glasses  Pertinent Vitals/Pain Pain Assessment Pain Assessment: 0-10 Pain Score: 10-Worst pain ever Pain Location: left shoulder Pain Descriptors / Indicators: Discomfort, Crying, Grimacing, Aching Pain Intervention(s): Limited activity within patient's tolerance, Monitored during session, Repositioned        Extremity/Trunk Assessment Upper Extremity Assessment Upper Extremity Assessment: Generalized weakness   Lower Extremity Assessment Lower Extremity Assessment: Generalized weakness       Communication     Cognition Arousal/Alertness: Awake/alert Behavior During Therapy: WFL for tasks assessed/performed Overall Cognitive Status: Within Functional Limits for tasks assessed                                                   Home Living Family/patient expects to be discharged to:: Private residence Living Arrangements: Alone Available Help at Discharge: Other (Comment) (No available assistance at discharge. Pt's son stated they are looking for long-term placement upon discharge.)               Bathroom Shower/Tub: Tub/shower unit   Bathroom Toilet: Standard     Home Equipment: Rollator (4 wheels);Cane - quad;Wheelchair - manual          Prior Functioning/Environment Prior Level of Function : Independent/Modified Independent;Driving                        OT Problem List: Decreased strength;Decreased range of motion;Decreased activity tolerance;Decreased safety awareness      OT Treatment/Interventions:      OT Goals(Current goals can be found in the care plan section) Acute Rehab OT Goals Patient Stated Goal: to return home. OT Goal Formulation: With patient Time For Goal Achievement: 06/13/22 Potential to Achieve Goals: Good ADL Goals Pt Will Perform Grooming: with modified independence Pt Will Perform Upper Body Bathing: with adaptive equipment;with mod assist Pt Will Perform Lower Body Bathing: with adaptive equipment;with mod assist Pt Will Perform Upper Body Dressing: with mod assist Pt Will Perform Lower Body Dressing: with min assist;with adaptive equipment Pt Will Transfer to Toilet: with modified independence Pt Will Perform Toileting - Clothing Manipulation and hygiene: with min assist  OT Frequency: Min 2X/week       AM-PAC OT "6 Clicks" Daily Activity     Outcome Measure Help from another person eating meals?: None Help from another person taking care of personal grooming?: A Little Help from another person toileting, which includes using toliet, bedpan, or urinal?: A Little Help from another person bathing (including washing, rinsing, drying)?: A Little Help from another person to put on and taking off regular upper body clothing?: A Lot Help  from another person to put on and taking off regular lower body clothing?: A Little 6 Click Score: 18   End of Session Nurse Communication: Mobility status  Activity Tolerance: Patient limited by pain Patient left: in bed;with call bell/phone within reach;with bed alarm set;with family/visitor present  OT Visit Diagnosis: Muscle weakness (generalized) (M62.81);Pain Pain - Right/Left: Left Pain - part of body: Shoulder                Time: 5732-2025 OT Time Calculation (min): 15 min Charges:  OT General Charges $OT Visit: 1 Visit OT Evaluation $OT Eval Moderate Complexity: [place], OTS 05/30/2022, 1:41 PM

## 2022-05-30 NOTE — Progress Notes (Signed)
  Progress Note   Patient: Deborah Obrien UEA:[id number] DOB: 1941-07-02 DOA: 05/29/2022     0 DOS: the patient was seen and examined on 05/30/2022     Assessment and Plan: * Humerus fracture Patient in a lot of pain requiring IV medications to control pain.  Patient lives by herself and may end up needing rehab.  Permanent atrial fibrillation (HCC) Continue amiodarone.  Patient has a pacemaker.  With head trauma, I will hold Eliquis at this time.  Increased risk of stroke.  Repeat CT scan for later today   Drop in hemoglobin Hemoglobin went from 11.4 down to 9.4.  We will hold Eliquis with head trauma and humerus fracture.  We will send off a ferritin tomorrow morning to further classify anemia.  Gastro-esophageal reflux disease without esophagitis Oral PPI  Dizziness Continue to monitor.   CAD (coronary artery disease) Stable.  Holding Eliquis.  Holding pravastatin with slightly elevated liver function test.  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  Creatinine 1.72 on presentation baseline 1.4.  Today's creatinine 1.64  History of gout Continue allopurinol  Elevated liver function tests Hold pravastatin today and recheck tomorrow           Subjective: Patient complaining of a lot of pain in her left arm.  Heard something pop today.  Son is interested in rehab since the patient does live alone.  Bruising right forehead.  Physical Exam: Vitals:   05/30/22 0000 05/30/22 0045 05/30/22 0204 05/30/22 0855  BP: (!) 131/47 (!) 127/49  (!) 110/39  Pulse: 60 (!) 59  (!) 59  Resp: 17 17  18   Temp:  98.2 F (36.8 C)  98.4 F (36.9 C)  TempSrc:    Oral  SpO2: 96% 100%  100%  Weight:   78.8 kg   Height:   5\' 3"  (1.6 m)    Physical Exam HENT:     Head: Normocephalic.     Mouth/Throat:     Pharynx: No oropharyngeal exudate.  Eyes:     General: Lids are  normal.     Conjunctiva/sclera: Conjunctivae normal.  Cardiovascular:     Rate and Rhythm: Normal rate and regular rhythm.     Heart sounds: Normal heart sounds, S1 normal and S2 normal.  Pulmonary:     Breath sounds: No decreased breath sounds, wheezing, rhonchi or rales.  Abdominal:     Palpations: Abdomen is soft.     Tenderness: There is no abdominal tenderness.  Musculoskeletal:     Right lower leg: No swelling.     Left lower leg: No swelling.  Skin:    General: Skin is warm.     Comments: Bruising right forehead  Neurological:     Mental Status: She is alert and oriented to person, place, and time.     Data Reviewed: Hemoglobin A1c 6.2. Hemoglobin dropped from 11.1 down to 9.4, platelet count of 165, creatinine 1.64, ALT 50, AST 76, alkaline phosphatase 175, total bilirubin 1.6  Family Communication: Spoke with son at bedside  Disposition: Status is: Inpatient Remains inpatient appropriate because: Still a lot of pain requiring IV and oral medications.  Repeat CT scan ordered for later today.  Planned Discharge Destination: Rehab    Time spent: 28 minutes  Author: Loletha Grayer, MD 05/30/2022 1:38 PM  For on call review [email or web address].

## 2022-05-31 ENCOUNTER — Encounter: Payer: Medicare Other | Admitting: Internal Medicine

## 2022-05-31 ENCOUNTER — Inpatient Hospital Stay: Payer: Medicare Other

## 2022-05-31 DIAGNOSIS — G2581 Restless legs syndrome: Secondary | ICD-10-CM | POA: Diagnosis not present

## 2022-05-31 DIAGNOSIS — R710 Precipitous drop in hematocrit: Secondary | ICD-10-CM | POA: Diagnosis not present

## 2022-05-31 DIAGNOSIS — S42202S Unspecified fracture of upper end of left humerus, sequela: Secondary | ICD-10-CM | POA: Diagnosis not present

## 2022-05-31 DIAGNOSIS — I4821 Permanent atrial fibrillation: Secondary | ICD-10-CM | POA: Diagnosis not present

## 2022-05-31 DIAGNOSIS — R42 Dizziness and giddiness: Secondary | ICD-10-CM

## 2022-05-31 LAB — CBC
HCT: 27.3 % — ABNORMAL LOW (ref 36.0–46.0)
Hemoglobin: 8.7 g/dL — ABNORMAL LOW (ref 12.0–15.0)
MCH: 31.6 pg (ref 26.0–34.0)
MCHC: 31.9 g/dL (ref 30.0–36.0)
MCV: 99.3 fL (ref 80.0–100.0)
Platelets: 143 10*3/uL — ABNORMAL LOW (ref 150–400)
RBC: 2.75 MIL/uL — ABNORMAL LOW (ref 3.87–5.11)
RDW: 12.9 % (ref 11.5–15.5)
WBC: 6.3 10*3/uL (ref 4.0–10.5)
nRBC: 0 % (ref 0.0–0.2)

## 2022-05-31 LAB — BASIC METABOLIC PANEL
Anion gap: 8 (ref 5–15)
BUN: 40 mg/dL — ABNORMAL HIGH (ref 8–23)
CO2: 26 mmol/L (ref 22–32)
Calcium: 8.2 mg/dL — ABNORMAL LOW (ref 8.9–10.3)
Chloride: 106 mmol/L (ref 98–111)
Creatinine, Ser: 1.6 mg/dL — ABNORMAL HIGH (ref 0.44–1.00)
GFR, Estimated: 32 mL/min — ABNORMAL LOW (ref >60)
Glucose, Bld: 193 mg/dL — ABNORMAL HIGH (ref 70–99)
Potassium: 4.6 mmol/L (ref 3.5–5.1)
Sodium: 140 mmol/L (ref 135–145)

## 2022-05-31 LAB — IRON AND TIBC
Iron: 28 ug/dL (ref 28–170)
Saturation Ratios: 10 % — ABNORMAL LOW (ref 10.4–31.8)
TIBC: 273 ug/dL (ref 250–450)
UIBC: 245 ug/dL

## 2022-05-31 LAB — GLUCOSE, CAPILLARY
Glucose-Capillary: 237 mg/dL — ABNORMAL HIGH (ref 70–99)
Glucose-Capillary: 251 mg/dL — ABNORMAL HIGH (ref 70–99)
Glucose-Capillary: 160 mg/dL — ABNORMAL HIGH (ref 70–99)
Glucose-Capillary: 194 mg/dL — ABNORMAL HIGH (ref 70–99)

## 2022-05-31 LAB — FERRITIN: Ferritin: 70 ng/mL (ref 11–307)

## 2022-05-31 MED ORDER — GABAPENTIN 300 MG PO CAPS
300.0000 mg | ORAL_CAPSULE | Freq: Three times a day (TID) | ORAL | Status: DC
  Administered 2022-05-31 – 2022-06-08 (×24): 300 mg via ORAL
  Filled 2022-05-31 (×24): qty 1

## 2022-05-31 MED ORDER — ROPINIROLE HCL 0.25 MG PO TABS
0.2500 mg | ORAL_TABLET | Freq: Once | ORAL | Status: AC
  Administered 2022-05-31: 0.25 mg via ORAL
  Filled 2022-05-31: qty 1

## 2022-05-31 MED ORDER — ROPINIROLE HCL 0.25 MG PO TABS
0.2500 mg | ORAL_TABLET | Freq: Every day | ORAL | Status: DC
  Administered 2022-05-31 – 2022-06-07 (×7): 0.25 mg via ORAL
  Filled 2022-05-31 (×9): qty 1

## 2022-05-31 NOTE — TOC Progression Note (Signed)
Transition of Care [facility]) - Progression Note    Patient Details  Name: [name] MRN: [id number] Date of Birth: December 10, 1940  Transition of Care [facility]) CM/SW [place], RN Phone Number: 05/31/2022, 2:13 PM  Clinical Narrative:      Ins was approved to go to [facility] [id number] 9/19-9/21      Expected Discharge Plan and Services                                                 Social Determinants of Health (SDOH) Interventions    Readmission Risk Interventions     No data to display

## 2022-05-31 NOTE — Progress Notes (Signed)
Progress Note   Patient: Deborah Obrien GYI:[id number] DOB: [birth year] DOA: 05/29/2022     1 DOS: the patient was seen and examined on 05/31/2022   Brief hospital course: 81 year old female who lives alone with past medical history of atrial fibrillation on Eliquis, GERD, anxiety, anemia, CKD, diabetes mellitus, hypertension, hypothyroidism, obesity presented after a fall and found to have a left humerus fracture.  Orthopedics recommended conservative management.  Assessment and Plan: * Humerus fracture Patient in a lot of pain requiring IV medication and oral medication to control pain.  Follow-up with orthopedic as outpatient.  Will need rehab.  Restless leg syndrome Restarted gabapentin.  We will give him use of Requip this morning and nightly.  Permanent atrial fibrillation (HCC) Continue amiodarone.  Patient has a pacemaker.  With head trauma, I will hold Eliquis at this time.  Increased risk of stroke.  Repeat CT scan today.   Drop in hemoglobin Hemoglobin went from 11.4 down to 8.7 continue to hold Eliquis with head trauma and humerus fracture.  With iron deficiency anemia.  Gastro-esophageal reflux disease without esophagitis Oral PPI  Dizziness Continue to monitor.   CAD (coronary artery disease) Stable.  Holding Eliquis.  Holding pravastatin with slightly elevated liver function test.  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  Creatinine 1.72 on presentation baseline 1.4.  Today's creatinine 1.60  History of gout Continue allopurinol  Elevated liver function tests Hold pravastatin today and recheck tomorrow           Subjective: Patient had a rough night with her legs cramping and severe pain in the left arm.  Hemoglobin dropped down to 8.7.  Came in after a fall and found to have a left humerus fracture.  Physical Exam: Vitals:   05/30/22 2220  05/30/22 2236 05/31/22 0325 05/31/22 0922  BP: 104/62 131/68 (!) 120/48 (!) 113/47  Pulse: 60 61 60 60  Resp: 18 18 20 17   Temp: 99.3 F (37.4 C) 98.8 F (37.1 C) 98.6 F (37 C) 98.1 F (36.7 C)  TempSrc:      SpO2: 100% 100% 100% 100%  Weight:      Height:       Physical Exam HENT:     Head: Normocephalic.     Mouth/Throat:     Pharynx: No oropharyngeal exudate.  Eyes:     General: Lids are normal.     Conjunctiva/sclera: Conjunctivae normal.  Cardiovascular:     Rate and Rhythm: Normal rate and regular rhythm.     Heart sounds: Normal heart sounds, S1 normal and S2 normal.  Pulmonary:     Breath sounds: No decreased breath sounds, wheezing, rhonchi or rales.  Abdominal:     Palpations: Abdomen is soft.     Tenderness: There is no abdominal tenderness.  Musculoskeletal:     Right lower leg: No swelling.     Left lower leg: No swelling.  Skin:    General: Skin is warm.     Comments: Bruising right forehead  Neurological:     Mental Status: She is alert and oriented to person, place, and time.     Data Reviewed: Creatinine 1.6, ferritin 78, hemoglobin 8.7, platelet count 143  Family Communication: Spoke with son on the phone  Disposition: Status is: Inpatient Remains inpatient appropriate because: Patient with quite a bit of pain overnight and this morning.  Also had a rough night with regards to  sleeping and leg strengthening.  Hemoglobin dropped to 8.7. Planned Discharge Destination: Rehab    Time spent: 28 minutes  Author: Loletha Grayer, MD 05/31/2022 2:32 PM  For on call review [email or web address].

## 2022-05-31 NOTE — Hospital Course (Signed)
81 year old female who lives alone with past medical history of atrial fibrillation on Eliquis, GERD, anxiety, anemia, CKD, diabetes mellitus, hypertension, hypothyroidism, obesity presented after a fall and found to have a left humerus fracture.  Orthopedics recommended conservative management.

## 2022-05-31 NOTE — Progress Notes (Signed)
Physical Therapy Treatment Patient Details Name: Deborah Obrien MRN: [id number] DOB: [birth year] Today's Date: 05/31/2022   History of Present Illness Pt is an 81 y.o. female presenting to hospital 9/17 s/p fall (tripped and fell over her purse while visiting her husband at his nursing facility); hit L arm and c/o severe pain in middle of L UE that moves up into her shoulder; c/o some pain in R knee; also striking her head.  H/o recent falls.  Imaging showing comminuted and angulated fx of the midshaft of the L humerus (spiral displaced fx L humeral shaft).  Plan for non-operative treatment at this time.  Per Dr. Sabra Heck of orthopedics: sugar-tone/coaptation splint placed (and manipulated with improved alignment of humerus).  Pt admitted s/p fall, L humerus fx, and dizziness.  PMH includes htn, DM, CHF, a-fib on Eliquis, CKD, anemia, GERD, gout, pacemaker, stroke, and chronic pain syndrome.    PT Comments    Pt resting in bed upon PT arrival; nurse cleared pt for therapy participation; pt agreeable to PT session.  8/10 L UE pain beginning/end of session at rest (pain increased with activity).  During session, pt mod to max assist with bed mobility; SBA with sitting balance; mod assist to stand from bed with R UE holding onto therapists arm; and mod assist to side step to L along bed x3 feet with R UE holding onto therapists arm.  Pt requiring support for L UE during session during transitions and when sitting (pillow under L UE) to improve comfort (even with sling in place).  Will continue to focus on strengthening and progressive functional mobility during hospitalization.   Recommendations for follow up therapy are one component of a multi-disciplinary discharge planning process, led by the attending physician.  Recommendations may be updated based on patient status, additional functional criteria and insurance authorization.  Follow Up Recommendations  Skilled nursing-short term rehab (<3  hours/day) Can patient physically be transported by private vehicle: No   Assistance Recommended at Discharge Frequent or constant Supervision/Assistance  Patient can return home with the following Two people to help with walking and/or transfers;Two people to help with bathing/dressing/bathroom;Assistance with cooking/housework;Assist for transportation;Help with stairs or ramp for entrance   Equipment Recommendations  BSC/3in1;Wheelchair (measurements PT);Wheelchair cushion (measurements PT);Hospital bed    Recommendations for Other Services       Precautions / Restrictions Precautions Precautions: Fall Precaution Comments: Aspiration Required Braces or Orthoses: Splint/Cast;Sling Splint/Cast: L UE splint in place Restrictions Weight Bearing Restrictions: Yes LUE Weight Bearing: Non weight bearing     Mobility  Bed Mobility Overal bed mobility: Needs Assistance Bed Mobility: Supine to Sit, Sit to Supine     Supine to sit: Mod assist, Max assist, HOB elevated (assist for trunk) Sit to supine: Mod assist, Max assist, HOB elevated (assist for trunk)   General bed mobility comments: pt able to move B LE's off bed and back onto bed on her own (extra time/effort to perform) and had therapist adjust HOB height to assist with comfort and bed mobility; assist for trunk and supporting L UE for comfort    Transfers Overall transfer level: Needs assistance Equipment used:  (pt holding onto therapist arm with R UE) Transfers: Sit to/from Stand Sit to Stand: Mod assist           General transfer comment: assist to stand from bed and control descent sitting; vc's for technique    Ambulation/Gait Ambulation/Gait assistance: Mod assist Gait Distance (Feet):  (pt side stepped to L  along bed x3 feet)     Gait velocity: decreased     General Gait Details: assist for balance (d/t posterior lean and unsteady); vc's for technique; pt holding onto therapists arm with R  UE   Stairs             Wheelchair Mobility    Modified Rankin (Stroke Patients Only)       Balance Overall balance assessment: Needs assistance Sitting-balance support: Single extremity supported, Feet supported Sitting balance-Leahy Scale: Fair Sitting balance - Comments: steady static sitting balance   Standing balance support: Single extremity supported Standing balance-Leahy Scale: Poor Standing balance comment: intermittent posterior lean noted in standing                            Cognition Arousal/Alertness:  (Appearing tired today) Behavior During Therapy: Anxious Overall Cognitive Status: Within Functional Limits for tasks assessed                                          Exercises General Exercises - Lower Extremity Ankle Circles/Pumps: AROM, Strengthening, Both, 10 reps, Seated Long Arc Quad: AROM, Strengthening, Both, 10 reps, Seated Hip Flexion/Marching: AROM, Strengthening, Both, 10 reps, Seated    General Comments General comments (skin integrity, edema, etc.): L UE splint and sling in place      Pertinent Vitals/Pain Pain Assessment Pain Assessment: 0-10 Pain Score: 8  Pain Descriptors / Indicators: Discomfort, Grimacing, Aching, Guarding, Sharp, Shooting Pain Intervention(s): Limited activity within patient's tolerance, Monitored during session, Premedicated before session, Repositioned, Other (comment) (RN notified of pt's pain level) Vitals (HR and O2 on 1 L via nasal cannula) stable and WFL throughout treatment session.    Home Living                          Prior Function            PT Goals (current goals can now be found in the care plan section) Acute Rehab PT Goals Patient Stated Goal: to improve pain and mobility PT Goal Formulation: With patient Time For Goal Achievement: 06/13/22 Potential to Achieve Goals: Fair Progress towards PT goals: Progressing toward goals     Frequency    7X/week      PT Plan Current plan remains appropriate    Co-evaluation              AM-PAC PT "6 Clicks" Mobility   Outcome Measure  Help needed turning from your back to your side while in a flat bed without using bedrails?: A Lot Help needed moving from lying on your back to sitting on the side of a flat bed without using bedrails?: A Lot Help needed moving to and from a bed to a chair (including a wheelchair)?: A Lot Help needed standing up from a chair using your arms (e.g., wheelchair or bedside chair)?: A Lot Help needed to walk in hospital room?: Total Help needed climbing 3-5 steps with a railing? : Total 6 Click Score: 10    End of Session Equipment Utilized During Treatment: Gait belt;Other (comment) (L UE sling) Activity Tolerance: Patient limited by pain Patient left: in bed;with call bell/phone within reach;with bed alarm set;with SCD's reapplied;Other (comment) (SCD only on R LE per pt request (d/t pt reporting being uncomfortable on L LE  and was only on R LE upon therapy arrival to pt's room); L UE supported on pillow per pt comfort) Nurse Communication: Mobility status;Precautions;Weight bearing status;Other (comment) (pt's pain status) PT Visit Diagnosis: Other abnormalities of gait and mobility (R26.89);Muscle weakness (generalized) (M62.81);History of falling (Z91.81);Pain Pain - Right/Left: Left Pain - part of body: Arm     Time: 8372-9021 PT Time Calculation (min) (ACUTE ONLY): 38 min  Charges:  $Therapeutic Exercise: 8-22 mins $Therapeutic Activity: 23-37 mins                     Leitha Bleak, PT 05/31/22, 4:34 PM

## 2022-05-31 NOTE — Inpatient Diabetes Management (Signed)
Inpatient Diabetes Program Recommendations  AACE/ADA: New Consensus Statement on Inpatient Glycemic Control   Target Ranges:  Prepandial:   less than 140 mg/dL      Peak postprandial:   less than 180 mg/dL (1-2 hours)      Critically ill patients:  140 - 180 mg/dL    Latest Reference Range & Units 05/31/22 09:12 05/31/22 12:43  Glucose-Capillary 70 - 99 mg/dL 237 (H) 251 (H)    Latest Reference Range & Units 05/30/22 09:05 05/30/22 13:07 05/30/22 18:11 05/30/22 22:29  Glucose-Capillary 70 - 99 mg/dL 146 (H) 263 (H) 200 (H) 113 (H)    Review of Glycemic Control  Diabetes history: DM2 Outpatient Diabetes medications: Lantus 14 units daily, Novolog 0-12 units TID with meals Current orders for Inpatient glycemic control: Novolog 0-15 units TID with meals  Inpatient Diabetes Program Recommendations:    Insulin: Please consider ordering Semglee 8 units Q24H.  Thanks, Barnie Alderman, RN, MSN, [place] Diabetes Coordinator Inpatient Diabetes Program [phone] (Team Pager from 8am to [place])

## 2022-05-31 NOTE — Assessment & Plan Note (Signed)
Restarted gabapentin.  We will give him use of Requip this morning and nightly.

## 2022-05-31 NOTE — TOC Progression Note (Signed)
Transition of Care [facility]) - Progression Note    Patient Details  Name: Deborah Obrien MRN: [id number] Date of Birth: 09-Apr-1941  Transition of Care [facility]) CM/SW [place], RN Phone Number: 05/31/2022, 1:53 PM  Clinical Narrative:     Damaris Schooner to Lennette Bihari the patient's son An reviewed the bed offers, They chose the bed at [facility], I notified JAARS pending       Expected Discharge Plan and Services                                                 Social Determinants of Health (SDOH) Interventions    Readmission Risk Interventions     No data to display

## 2022-06-01 DIAGNOSIS — S42202S Unspecified fracture of upper end of left humerus, sequela: Secondary | ICD-10-CM | POA: Diagnosis not present

## 2022-06-01 LAB — COMPREHENSIVE METABOLIC PANEL
ALT: 41 U/L (ref 0–44)
AST: 46 U/L — ABNORMAL HIGH (ref 15–41)
Albumin: 2.8 g/dL — ABNORMAL LOW (ref 3.5–5.0)
Alkaline Phosphatase: 213 U/L — ABNORMAL HIGH (ref 38–126)
Anion gap: 5 (ref 5–15)
BUN: 46 mg/dL — ABNORMAL HIGH (ref 8–23)
CO2: 25 mmol/L (ref 22–32)
Calcium: 8.5 mg/dL — ABNORMAL LOW (ref 8.9–10.3)
Chloride: 106 mmol/L (ref 98–111)
Creatinine, Ser: 1.81 mg/dL — ABNORMAL HIGH (ref 0.44–1.00)
GFR, Estimated: 28 mL/min — ABNORMAL LOW (ref >60)
Glucose, Bld: 452 mg/dL — ABNORMAL HIGH (ref 70–99)
Potassium: 4.9 mmol/L (ref 3.5–5.1)
Sodium: 136 mmol/L (ref 135–145)
Total Bilirubin: 1 mg/dL (ref 0.3–1.2)
Total Protein: 5.8 g/dL — ABNORMAL LOW (ref 6.5–8.1)

## 2022-06-01 LAB — CBC
HCT: 26.5 % — ABNORMAL LOW (ref 36.0–46.0)
Hemoglobin: 8.4 g/dL — ABNORMAL LOW (ref 12.0–15.0)
MCH: 32.1 pg (ref 26.0–34.0)
MCHC: 31.7 g/dL (ref 30.0–36.0)
MCV: 101.1 fL — ABNORMAL HIGH (ref 80.0–100.0)
Platelets: 125 10*3/uL — ABNORMAL LOW (ref 150–400)
RBC: 2.62 MIL/uL — ABNORMAL LOW (ref 3.87–5.11)
RDW: 13 % (ref 11.5–15.5)
WBC: 5.3 10*3/uL (ref 4.0–10.5)
nRBC: 0 % (ref 0.0–0.2)

## 2022-06-01 LAB — GLUCOSE, CAPILLARY
Glucose-Capillary: 419 mg/dL — ABNORMAL HIGH (ref 70–99)
Glucose-Capillary: 261 mg/dL — ABNORMAL HIGH (ref 70–99)
Glucose-Capillary: 127 mg/dL — ABNORMAL HIGH (ref 70–99)
Glucose-Capillary: 203 mg/dL — ABNORMAL HIGH (ref 70–99)

## 2022-06-01 MED ORDER — INSULIN ASPART 100 UNIT/ML IJ SOLN
5.0000 [IU] | Freq: Once | INTRAMUSCULAR | Status: AC
  Administered 2022-06-01: 5 [IU] via SUBCUTANEOUS
  Filled 2022-06-01: qty 1

## 2022-06-01 MED ORDER — CEFAZOLIN SODIUM-DEXTROSE 2-4 GM/100ML-% IV SOLN
2.0000 g | Freq: Once | INTRAVENOUS | Status: AC
  Administered 2022-06-02: 2 g via INTRAVENOUS

## 2022-06-01 MED ORDER — INSULIN GLARGINE-YFGN 100 UNIT/ML ~~LOC~~ SOLN
8.0000 [IU] | Freq: Every day | SUBCUTANEOUS | Status: DC
  Administered 2022-06-01 – 2022-06-03 (×3): 8 [IU] via SUBCUTANEOUS
  Filled 2022-06-01 (×3): qty 0.08

## 2022-06-01 NOTE — Progress Notes (Signed)
Physical Therapy Treatment Patient Details Name: Deborah Obrien MRN: [id number] DOB: 18-Jan-1941 Today's Date: 06/01/2022   History of Present Illness Pt is an 81 y.o. female presenting to hospital 9/17 s/p fall (tripped and fell over her purse while visiting her husband at his nursing facility); hit L arm and c/o severe pain in middle of L UE that moves up into her shoulder; c/o some pain in R knee; also striking her head.  H/o recent falls.  Imaging showing comminuted and angulated fx of the midshaft of the L humerus (spiral displaced fx L humeral shaft).  Plan for non-operative treatment at this time.  Per Dr. Sabra Heck of orthopedics: sugar-tone/coaptation splint placed (and manipulated with improved alignment of humerus).  Pt admitted s/p fall, L humerus fx, and dizziness.  PMH includes htn, DM, CHF, a-fib on Eliquis, CKD, anemia, GERD, gout, pacemaker, stroke, and chronic pain syndrome.    PT Comments    Pt was long sitting in bed upon arriving. Endorses severe pain however was cooperative and wiling to participate. Continues to require significant assistance to exit bed, stan and an ambulate. Pain greatly limits pt's abilities. She was unable to get comfortable due to bulkiness of splint and pain. She will require continued skilled PT to address deficits while maximizing independence with ADLs.Recommend rehab at SNF to address these concerns.     Recommendations for follow up therapy are one component of a multi-disciplinary discharge planning process, led by the attending physician.  Recommendations may be updated based on patient status, additional functional criteria and insurance authorization.  Follow Up Recommendations  Skilled nursing-short term rehab (<3 hours/day)     Assistance Recommended at Discharge Frequent or constant Supervision/Assistance  Patient can return home with the following A lot of help with walking and/or transfers;A lot of help with  bathing/dressing/bathroom;Assistance with cooking/housework;Assist for transportation;Help with stairs or ramp for entrance   Equipment Recommendations  BSC/3in1;Wheelchair (measurements PT);Wheelchair cushion (measurements PT)       Precautions / Restrictions Precautions Precautions: Fall Precaution Comments: Aspiration Required Braces or Orthoses: Splint/Cast;Sling Splint/Cast: L UE splint in place Restrictions Weight Bearing Restrictions: Yes LUE Weight Bearing: Non weight bearing     Mobility  Bed Mobility Overal bed mobility: Needs Assistance Bed Mobility: Supine to Sit  Supine to sit: Min assist, Mod assist  General bed mobility comments: Min-mod assist of one to achieve EOB short sit. Increased time required due to pain    Transfers Overall transfer level: Needs assistance Equipment used: 1 person hand held assist Transfers: Sit to/from Stand Sit to Stand: Min assist, Mod assist     Ambulation/Gait Ambulation/Gait assistance: Min assist Gait Distance (Feet): 5 Feet Assistive device: 1 person hand held assist Gait Pattern/deviations: Step-through pattern       General Gait Details: distance greatly limited by pain and breafast arriving. RN aware of pain concerns. Pt agreeable to SNF    Balance Overall balance assessment: Needs assistance Sitting-balance support: Single extremity supported, Feet supported Sitting balance-Leahy Scale: Fair Sitting balance - Comments: steady static sitting balance   Standing balance support: Single extremity supported Standing balance-Leahy Scale: Fair        Cognition Arousal/Alertness: Awake/alert Behavior During Therapy: WFL for tasks assessed/performed Overall Cognitive Status: Within Functional Limits for tasks assessed      General Comments: Pt is A and O x 4 but in extreme pain throughout most of session.           General Comments General comments (skin integrity, edema,  etc.): Authro reviewed importance of  positioning/elevation, icing, and moving hand, wrist to promote circulation.      Pertinent Vitals/Pain Pain Assessment Pain Assessment: 0-10 Pain Score: 7  Pain Location: L UE/shoulder Pain Descriptors / Indicators: Discomfort, Grimacing, Aching, Guarding, Sharp, Shooting Pain Intervention(s): Limited activity within patient's tolerance, Premedicated before session, Monitored during session, Patient requesting pain meds-RN notified, Repositioned, Ice applied     PT Goals (current goals can now be found in the care plan section) Acute Rehab PT Goals Patient Stated Goal: to improve pain and mobility Progress towards PT goals: Progressing toward goals    Frequency    7X/week      PT Plan Current plan remains appropriate       AM-PAC PT "6 Clicks" Mobility   Outcome Measure  Help needed turning from your back to your side while in a flat bed without using bedrails?: A Lot Help needed moving from lying on your back to sitting on the side of a flat bed without using bedrails?: A Lot Help needed moving to and from a bed to a chair (including a wheelchair)?: A Lot Help needed standing up from a chair using your arms (e.g., wheelchair or bedside chair)?: A Lot Help needed to walk in hospital room?: A Lot Help needed climbing 3-5 steps with a railing? : A Lot 6 Click Score: 12    End of Session Equipment Utilized During Treatment:  (LUE sling/ pt was on o2 upon arriving however removed during session with sao2 > 95% on rm air. RN aware) Activity Tolerance: Patient limited by pain Patient left: in chair;with call bell/phone within reach;with nursing/sitter in room Nurse Communication: Mobility status;Precautions;Weight bearing status;Other (comment) PT Visit Diagnosis: Other abnormalities of gait and mobility (R26.89);Muscle weakness (generalized) (M62.81);History of falling (Z91.81);Pain Pain - Right/Left: Left Pain - part of body: Arm     Time: 1287-8676 PT Time  Calculation (min) (ACUTE ONLY): 25 min  Charges:  $Therapeutic Activity: 23-37 mins                     Julaine Fusi PTA 06/01/22, 10:06 AM

## 2022-06-01 NOTE — Progress Notes (Signed)
PROGRESS NOTE    Deborah Obrien  HKV:[id number] DOB: Apr 14, 1941 DOA: 05/29/2022 PCP: [name], MD    Brief Narrative:  81 year old female who lives alone with past medical history of atrial fibrillation on Eliquis, GERD, anxiety, anemia, CKD, diabetes mellitus, hypertension, hypothyroidism, obesity presented after a fall and found to have a left humerus fracture.  Orthopedics intially recommended conservative management.  Patient has been a significant amount of pain that has been difficult to get under control.  She does have initial relief to IV narcotics however becomes lethargic and confused after.  Reach out again to Dr. Roland Rack per the patient and son's request.  He indicated he would take patient to the operating room on Thursday 9/21.    Assessment & Plan:   Principal Problem:   Humerus fracture Active Problems:   Permanent atrial fibrillation (HCC)   Restless leg syndrome   Drop in hemoglobin   Gastro-esophageal reflux disease without esophagitis   Dizziness   CAD (coronary artery disease)   Hypothyroidism   Acute kidney injury superimposed on chronic kidney disease (HCC)   Elevated liver function tests   History of gout  * Humerus fracture Patient in a lot of pain requiring IV medication and oral medication to control pain.  Orthopedics reconsulted Plan for operative fixation 9/21 N.p.o. after midnight   Restless leg syndrome PTA gabapentin and Requip   Permanent atrial fibrillation (HCC) Continue amiodarone.  Patient has a pacemaker.  Eliquis currently on hold.  Continue to hold as patient going to the OR tomorrow 9/21     Drop in hemoglobin Hemoglobin went from 11.4 down to 8.7 continue to hold Eliquis with head trauma and humerus fracture.  With iron deficiency anemia.   Gastro-esophageal reflux disease without esophagitis Oral PPI   Dizziness Continue to monitor.     CAD (coronary artery disease) Stable.  Holding Eliquis.  Holding  pravastatin with slightly elevated liver function test.   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  Creatinine 1.72 on presentation baseline 1.4.  Today's creatinine 1.60   History of gout Continue allopurinol   Elevated liver function tests Continue holding statin   DVT prophylaxis: SCD Code Status: Full Family Communication: Son at bedside 9/20 Disposition Plan: Status is: Inpatient Remains inpatient appropriate because: Humerus fracture.  Intractable pain.  Plan for operative fixation 9/21.   Level of care: Med-Surg  Consultants:  Orthopedics  Procedures:  None  Antimicrobials: None   Subjective: Seen and examined.  Sitting up in chair.  Son at bedside.  Patient with significant pain in affected extremity.  No other complaints.  Objective: Vitals:   05/31/22 1750 05/31/22 2214 06/01/22 0536 06/01/22 0747  BP: 107/67 (!) 102/37 (!) 128/44 (!) 120/47  Pulse: 62 62 63 62  Resp: 16 18 17 16   Temp:  99.2 F (37.3 C) 98.5 F (36.9 C) 98.6 F (37 C)  TempSrc:      SpO2: 99% 100% 100% 100%  Weight:      Height:        Intake/Output Summary (Last 24 hours) at 06/01/2022 1547 Last data filed at 06/01/2022 1015 Gross per 24 hour  Intake 130 ml  Output 200 ml  Net -70 ml   Filed Weights   05/29/22 1456 05/30/22 0204  Weight: 77.1 kg 78.8 kg    Examination:  General exam: Appears calm and comfortable  Respiratory system: Clear to auscultation.  Respiratory effort normal. Cardiovascular system: S1-S2, RRR, no murmurs, no pedal edema Gastrointestinal system: Soft, NT/ND, normal bowel sounds Central nervous system: Alert and oriented. No focal neurological deficits. Extremities: Pain and decreased ROM left upper extremity Skin: Bruise on forehead Psychiatry: Judgement and insight appear normal. Mood & affect appropriate.     Data Reviewed: I have  personally reviewed following labs and imaging studies  CBC: Recent Labs  Lab 05/29/22 2229 05/30/22 0323 05/31/22 0445 06/01/22 0630  WBC 7.8 6.2 6.3 5.3  NEUTROABS 6.0  --   --   --   HGB 11.1* 9.4* 8.7* 8.4*  HCT 35.2* 30.2* 27.3* 26.5*  MCV 100.3* 99.7 99.3 101.1*  PLT 172 165 143* 009*   Basic Metabolic Panel: Recent Labs  Lab 05/29/22 2229 05/30/22 0323 05/31/22 0445 06/01/22 0630  NA 137 136 140 136  K 4.2 4.5 4.6 4.9  CL 104 104 106 106  CO2 21* 27 26 25   GLUCOSE 135* 164* 193* 452*  BUN 41* 42* 40* 46*  CREATININE 1.72* 1.64* 1.60* 1.81*  CALCIUM 8.7* 8.2* 8.2* 8.5*   GFR: Estimated Creatinine Clearance: 24.2 mL/min (A) (by C-G formula based on SCr of 1.81 mg/dL (H)). Liver Function Tests: Recent Labs  Lab 05/30/22 0323 06/01/22 0630  AST 76* 46*  ALT 50* 41  ALKPHOS 175* 213*  BILITOT 1.6* 1.0  PROT 5.9* 5.8*  ALBUMIN 3.1* 2.8*   No results for input(s): "LIPASE", "AMYLASE" in the last 168 hours. No results for input(s): "AMMONIA" in the last 168 hours. Coagulation Profile: No results for input(s): "INR", "PROTIME" in the last 168 hours. Cardiac Enzymes: No results for input(s): "CKTOTAL", "CKMB", "CKMBINDEX", "TROPONINI" in the last 168 hours. BNP (last 3 results) No results for input(s): "PROBNP" in the last 8760 hours. HbA1C: Recent Labs    05/29/22 2229  HGBA1C 6.2*   CBG: Recent Labs  Lab 05/31/22 1243 05/31/22 1625 05/31/22 2214 06/01/22 0737 06/01/22 1140  GLUCAP 251* 160* 194* 419* 261*   Lipid Profile: No results for input(s): "CHOL", "HDL", "LDLCALC", "TRIG", "CHOLHDL", "LDLDIRECT" in the last 72 hours. Thyroid Function Tests: No results for input(s): "TSH", "T4TOTAL", "FREET4", "T3FREE", "THYROIDAB" in the last 72 hours. Anemia Panel: Recent Labs    05/31/22 0445  FERRITIN 70  TIBC 273  IRON 28   Sepsis Labs: No results for input(s): "PROCALCITON", "LATICACIDVEN" in the last 168 hours.  No results found for this  or any previous visit (from the past 240 hour(s)).       Radiology Studies: CT HEAD WO CONTRAST (5MM)  Result Date: 05/31/2022 CLINICAL DATA:  Fall EXAM: CT HEAD WITHOUT CONTRAST TECHNIQUE: Contiguous axial images were obtained from the base of the skull through the vertex without intravenous contrast. RADIATION DOSE REDUCTION: This exam was performed according to the departmental dose-optimization program which includes automated exposure control, adjustment of the mA and/or kV according to patient size and/or use of iterative reconstruction technique. COMPARISON:  CT head 05/29/2022 FINDINGS: Limitations-assessment is slightly limited due to motion artifact. Brain: Within the limitations of motion artifact, there is no evidence of acute infarction, hemorrhage, hydrocephalus, extra-axial collection or mass lesion/mass effect. Vascular: No hyperdense vessel or unexpected calcification. Skull: Normal. Negative for fracture or focal lesion. Hyperostosis frontalis. Sinuses/Orbits: Bilateral lens replacement. Other: None. IMPRESSION: Within the limitations of a motion degraded exam CT evidence of intracranial injury. Electronically Signed   By: Marin Roberts M.D.   On: 05/31/2022 16:38        Scheduled  Meds:  allopurinol  150 mg Oral Daily   amiodarone  200 mg Oral Daily   busPIRone  10 mg Oral BID   cyanocobalamin  1,000 mcg Oral Daily   DULoxetine  30 mg Oral Daily   gabapentin  300 mg Oral TID   insulin aspart  0-15 Units Subcutaneous TID WC   insulin glargine-yfgn  8 Units Subcutaneous Daily   levothyroxine  50 mcg Oral Q0600   pantoprazole  40 mg Oral BID   rOPINIRole  0.25 mg Oral QHS   sodium chloride flush  3 mL Intravenous Q12H   Continuous Infusions:   LOS: 2 days     Sidney Ace, MD [facility]   If 7PM-7AM, please contact night-coverage  06/01/2022, 3:47 PM

## 2022-06-02 ENCOUNTER — Inpatient Hospital Stay: Payer: Medicare Other | Admitting: Anesthesiology

## 2022-06-02 ENCOUNTER — Inpatient Hospital Stay: Payer: Medicare Other

## 2022-06-02 ENCOUNTER — Encounter: Payer: Self-pay | Admitting: Internal Medicine

## 2022-06-02 ENCOUNTER — Encounter
Admission: EM | Disposition: A | Discharge status: Skilled Nursing Facility | Payer: Self-pay | Source: Home / Self Care | Attending: Internal Medicine

## 2022-06-02 ENCOUNTER — Other Ambulatory Visit: Payer: Self-pay

## 2022-06-02 DIAGNOSIS — S42202S Unspecified fracture of upper end of left humerus, sequela: Secondary | ICD-10-CM | POA: Diagnosis not present

## 2022-06-02 DIAGNOSIS — S42202A Unspecified fracture of upper end of left humerus, initial encounter for closed fracture: Secondary | ICD-10-CM

## 2022-06-02 HISTORY — PX: HUMERUS IM NAIL: SHX1769

## 2022-06-02 LAB — CBC WITH DIFFERENTIAL/PLATELET
Abs Immature Granulocytes: 0.01 10*3/uL (ref 0.00–0.07)
Abs Immature Granulocytes: 0.03 10*3/uL (ref 0.00–0.07)
Basophils Absolute: 0 10*3/uL (ref 0.0–0.1)
Basophils Absolute: 0 10*3/uL (ref 0.0–0.1)
Basophils Relative: 1 %
Basophils Relative: 1 %
Eosinophils Absolute: 0.1 10*3/uL (ref 0.0–0.5)
Eosinophils Absolute: 0 10*3/uL (ref 0.0–0.5)
Eosinophils Relative: 2 %
Eosinophils Relative: 1 %
HCT: 28.4 % — ABNORMAL LOW (ref 36.0–46.0)
HCT: 26.3 % — ABNORMAL LOW (ref 36.0–46.0)
Hemoglobin: 8.7 g/dL — ABNORMAL LOW (ref 12.0–15.0)
Hemoglobin: 8.2 g/dL — ABNORMAL LOW (ref 12.0–15.0)
Immature Granulocytes: 0 %
Immature Granulocytes: 1 %
Lymphocytes Relative: 20 %
Lymphocytes Relative: 13 %
Lymphs Abs: 0.7 10*3/uL (ref 0.7–4.0)
Lymphs Abs: 0.6 10*3/uL — ABNORMAL LOW (ref 0.7–4.0)
MCH: 31.2 pg (ref 26.0–34.0)
MCH: 31.3 pg (ref 26.0–34.0)
MCHC: 30.6 g/dL (ref 30.0–36.0)
MCHC: 31.2 g/dL (ref 30.0–36.0)
MCV: 101.8 fL — ABNORMAL HIGH (ref 80.0–100.0)
MCV: 100.4 fL — ABNORMAL HIGH (ref 80.0–100.0)
Monocytes Absolute: 0.4 10*3/uL (ref 0.1–1.0)
Monocytes Absolute: 0.4 10*3/uL (ref 0.1–1.0)
Monocytes Relative: 10 %
Monocytes Relative: 10 %
Neutro Abs: 2.4 10*3/uL (ref 1.7–7.7)
Neutro Abs: 3.2 10*3/uL (ref 1.7–7.7)
Neutrophils Relative %: 67 %
Neutrophils Relative %: 74 %
Platelets: 152 10*3/uL (ref 150–400)
Platelets: 149 10*3/uL — ABNORMAL LOW (ref 150–400)
RBC: 2.79 MIL/uL — ABNORMAL LOW (ref 3.87–5.11)
RBC: 2.62 MIL/uL — ABNORMAL LOW (ref 3.87–5.11)
RDW: 13.1 % (ref 11.5–15.5)
RDW: 12.9 % (ref 11.5–15.5)
WBC: 3.6 10*3/uL — ABNORMAL LOW (ref 4.0–10.5)
WBC: 4.3 10*3/uL (ref 4.0–10.5)
nRBC: 0 % (ref 0.0–0.2)
nRBC: 0 % (ref 0.0–0.2)

## 2022-06-02 LAB — GLUCOSE, CAPILLARY
Glucose-Capillary: 217 mg/dL — ABNORMAL HIGH (ref 70–99)
Glucose-Capillary: 218 mg/dL — ABNORMAL HIGH (ref 70–99)
Glucose-Capillary: 236 mg/dL — ABNORMAL HIGH (ref 70–99)
Glucose-Capillary: 194 mg/dL — ABNORMAL HIGH (ref 70–99)

## 2022-06-02 LAB — BASIC METABOLIC PANEL
Anion gap: 6 (ref 5–15)
Anion gap: 11 (ref 5–15)
BUN: 59 mg/dL — ABNORMAL HIGH (ref 8–23)
BUN: 55 mg/dL — ABNORMAL HIGH (ref 8–23)
CO2: 25 mmol/L (ref 22–32)
CO2: 22 mmol/L (ref 22–32)
Calcium: 8.4 mg/dL — ABNORMAL LOW (ref 8.9–10.3)
Calcium: 8.4 mg/dL — ABNORMAL LOW (ref 8.9–10.3)
Chloride: 103 mmol/L (ref 98–111)
Chloride: 102 mmol/L (ref 98–111)
Creatinine, Ser: 2.04 mg/dL — ABNORMAL HIGH (ref 0.44–1.00)
Creatinine, Ser: 1.89 mg/dL — ABNORMAL HIGH (ref 0.44–1.00)
GFR, Estimated: 24 mL/min — ABNORMAL LOW (ref >60)
GFR, Estimated: 26 mL/min — ABNORMAL LOW (ref >60)
Glucose, Bld: 277 mg/dL — ABNORMAL HIGH (ref 70–99)
Glucose, Bld: 244 mg/dL — ABNORMAL HIGH (ref 70–99)
Potassium: 5.3 mmol/L — ABNORMAL HIGH (ref 3.5–5.1)
Potassium: 4.8 mmol/L (ref 3.5–5.1)
Sodium: 134 mmol/L — ABNORMAL LOW (ref 135–145)
Sodium: 135 mmol/L (ref 135–145)

## 2022-06-02 LAB — PROTIME-INR
INR: 1 (ref 0.8–1.2)
Prothrombin Time: 13.4 seconds (ref 11.4–15.2)

## 2022-06-02 SURGERY — INSERTION, INTRAMEDULLARY ROD, HUMERUS
Anesthesia: General | Site: Arm Upper | Laterality: Left

## 2022-06-02 MED ORDER — LIDOCAINE HCL (PF) 2 % IJ SOLN
INTRAMUSCULAR | Status: AC
  Filled 2022-06-02: qty 5

## 2022-06-02 MED ORDER — CEFAZOLIN SODIUM-DEXTROSE 2-4 GM/100ML-% IV SOLN
INTRAVENOUS | Status: AC
  Filled 2022-06-02: qty 100

## 2022-06-02 MED ORDER — NALOXONE HCL 0.4 MG/ML IJ SOLN
0.4000 mg | INTRAMUSCULAR | Status: DC | PRN
  Administered 2022-06-02 – 2022-06-03 (×2): 0.4 mg via INTRAVENOUS
  Filled 2022-06-02 (×4): qty 1

## 2022-06-02 MED ORDER — FENTANYL CITRATE (PF) 100 MCG/2ML IJ SOLN
INTRAMUSCULAR | Status: AC
  Administered 2022-06-02: 25 ug via INTRAVENOUS
  Filled 2022-06-02: qty 2

## 2022-06-02 MED ORDER — 0.9 % SODIUM CHLORIDE (POUR BTL) OPTIME
TOPICAL | Status: DC | PRN
  Administered 2022-06-02: 500 mL

## 2022-06-02 MED ORDER — FENTANYL CITRATE (PF) 100 MCG/2ML IJ SOLN
25.0000 ug | INTRAMUSCULAR | Status: DC | PRN
  Administered 2022-06-02 (×3): 25 ug via INTRAVENOUS

## 2022-06-02 MED ORDER — ACETAMINOPHEN 10 MG/ML IV SOLN
INTRAVENOUS | Status: AC
  Filled 2022-06-02: qty 100

## 2022-06-02 MED ORDER — ALBUMIN HUMAN 5 % IV SOLN
INTRAVENOUS | Status: AC
  Filled 2022-06-02: qty 250

## 2022-06-02 MED ORDER — LIDOCAINE HCL (CARDIAC) PF 100 MG/5ML IV SOSY
PREFILLED_SYRINGE | INTRAVENOUS | Status: DC | PRN
  Administered 2022-06-02: 70 mg via INTRAVENOUS

## 2022-06-02 MED ORDER — LACTATED RINGERS IV SOLN: INTRAVENOUS | Status: DC | PRN

## 2022-06-02 MED ORDER — INSULIN ASPART 100 UNIT/ML IJ SOLN
INTRAMUSCULAR | Status: AC
  Filled 2022-06-02: qty 1

## 2022-06-02 MED ORDER — METOPROLOL TARTRATE 25 MG PO TABS
12.5000 mg | ORAL_TABLET | Freq: Two times a day (BID) | ORAL | Status: DC
  Administered 2022-06-03 – 2022-06-08 (×8): 12.5 mg via ORAL
  Filled 2022-06-02 (×12): qty 1

## 2022-06-02 MED ORDER — TRANEXAMIC ACID 1000 MG/10ML IV SOLN
INTRAVENOUS | Status: AC
  Filled 2022-06-02: qty 10

## 2022-06-02 MED ORDER — SODIUM CHLORIDE 0.9 % IV SOLN: INTRAVENOUS | Status: DC

## 2022-06-02 MED ORDER — ACETAMINOPHEN 160 MG/5ML PO SOLN: 325.0000 mg | ORAL | Status: DC | PRN

## 2022-06-02 MED ORDER — PHENYLEPHRINE HCL-NACL 20-0.9 MG/250ML-% IV SOLN
INTRAVENOUS | Status: DC | PRN
  Administered 2022-06-02: 20 ug/min via INTRAVENOUS

## 2022-06-02 MED ORDER — ROCURONIUM BROMIDE 10 MG/ML (PF) SYRINGE
PREFILLED_SYRINGE | INTRAVENOUS | Status: AC
  Filled 2022-06-02: qty 10

## 2022-06-02 MED ORDER — BUPIVACAINE-EPINEPHRINE (PF) 0.5% -1:200000 IJ SOLN
INTRAMUSCULAR | Status: DC | PRN
  Administered 2022-06-02: 50 mL via INTRAMUSCULAR

## 2022-06-02 MED ORDER — INSULIN ASPART 100 UNIT/ML IJ SOLN
5.0000 [IU] | Freq: Once | INTRAMUSCULAR | Status: AC
  Administered 2022-06-02: 5 [IU] via SUBCUTANEOUS

## 2022-06-02 MED ORDER — PHENYLEPHRINE 80 MCG/ML (10ML) SYRINGE FOR IV PUSH (FOR BLOOD PRESSURE SUPPORT)
PREFILLED_SYRINGE | INTRAVENOUS | Status: DC | PRN
  Administered 2022-06-02 (×3): 80 ug via INTRAVENOUS

## 2022-06-02 MED ORDER — ONDANSETRON HCL 4 MG/2ML IJ SOLN
INTRAMUSCULAR | Status: DC | PRN
  Administered 2022-06-02: 4 mg via INTRAVENOUS

## 2022-06-02 MED ORDER — SUGAMMADEX SODIUM 200 MG/2ML IV SOLN
INTRAVENOUS | Status: DC | PRN
  Administered 2022-06-02: 200 mg via INTRAVENOUS

## 2022-06-02 MED ORDER — BUPIVACAINE-EPINEPHRINE (PF) 0.5% -1:200000 IJ SOLN
INTRAMUSCULAR | Status: AC
  Filled 2022-06-02: qty 30

## 2022-06-02 MED ORDER — ACETAMINOPHEN 325 MG PO TABS: 325.0000 mg | ORAL_TABLET | ORAL | Status: DC | PRN

## 2022-06-02 MED ORDER — ONDANSETRON HCL 4 MG/2ML IJ SOLN
INTRAMUSCULAR | Status: AC
  Filled 2022-06-02: qty 2

## 2022-06-02 MED ORDER — ACETAMINOPHEN 10 MG/ML IV SOLN
INTRAVENOUS | Status: DC | PRN
  Administered 2022-06-02: 650 mg via INTRAVENOUS

## 2022-06-02 MED ORDER — TRANEXAMIC ACID-NACL 1000-0.7 MG/100ML-% IV SOLN
INTRAVENOUS | Status: DC | PRN
  Administered 2022-06-02: 1000 mg via INTRAVENOUS

## 2022-06-02 MED ORDER — FENTANYL CITRATE (PF) 100 MCG/2ML IJ SOLN
INTRAMUSCULAR | Status: AC
  Filled 2022-06-02: qty 2

## 2022-06-02 MED ORDER — FENTANYL CITRATE (PF) 100 MCG/2ML IJ SOLN
INTRAMUSCULAR | Status: DC | PRN
  Administered 2022-06-02 (×4): 25 ug via INTRAVENOUS

## 2022-06-02 MED ORDER — PROPOFOL 10 MG/ML IV BOLUS
INTRAVENOUS | Status: DC | PRN
  Administered 2022-06-02: 70 mg via INTRAVENOUS

## 2022-06-02 MED ORDER — PROPOFOL 10 MG/ML IV BOLUS
INTRAVENOUS | Status: AC
  Filled 2022-06-02: qty 20

## 2022-06-02 MED ORDER — ROCURONIUM BROMIDE 100 MG/10ML IV SOLN
INTRAVENOUS | Status: DC | PRN
  Administered 2022-06-02: 50 mg via INTRAVENOUS
  Administered 2022-06-02: 10 mg via INTRAVENOUS

## 2022-06-02 MED ORDER — ALBUMIN HUMAN 5 % IV SOLN: INTRAVENOUS | Status: DC | PRN

## 2022-06-02 SURGICAL SUPPLY — 59 items
BIT DRILL CALIBRATED AFFXS 3.3 (DRILL) IMPLANT
BIT DRILL SURG TIB 3.3X152.5 (DRILL) IMPLANT
CHLORAPREP W/TINT 26 (MISCELLANEOUS) ×1 IMPLANT
COOLER POLAR GLACIER W/PUMP (MISCELLANEOUS) ×1 IMPLANT
DRAPE C-ARM XRAY 36X54 (DRAPES) ×2 IMPLANT
DRAPE INCISE 23X17 IOBAN STRL (DRAPES) ×1
DRAPE INCISE 23X17 STRL (DRAPES) IMPLANT
DRAPE INCISE IOBAN 23X17 STRL (DRAPES) ×1 IMPLANT
DRAPE INCISE IOBAN 66X45 STRL (DRAPES) ×2 IMPLANT
DRAPE ORTHO SPLIT 77X108 STRL (DRAPES) ×2
DRAPE SURG ORHT 6 SPLT 77X108 (DRAPES) ×2 IMPLANT
DRILL CALIBRATED AFFIXUS 3.3 (DRILL) ×1
DRILL SURG TIB 3.3X152.5 (DRILL) ×1
DRSG OPSITE POSTOP 3X4 (GAUZE/BANDAGES/DRESSINGS) IMPLANT
DRSG OPSITE POSTOP 4X6 (GAUZE/BANDAGES/DRESSINGS) IMPLANT
DRSG OPSITE POSTOP 4X8 (GAUZE/BANDAGES/DRESSINGS) ×1 IMPLANT
ELECT REM PT RETURN 9FT ADLT (ELECTROSURGICAL) ×1
ELECTRODE REM PT RTRN 9FT ADLT (ELECTROSURGICAL) ×1 IMPLANT
GAUZE SPONGE 4X4 12PLY STRL (GAUZE/BANDAGES/DRESSINGS) ×1 IMPLANT
GAUZE XEROFORM 1X8 LF (GAUZE/BANDAGES/DRESSINGS) ×1 IMPLANT
GLOVE BIO SURGEON STRL SZ8 (GLOVE) ×2 IMPLANT
GLOVE SURG UNDER LTX SZ8 (GLOVE) ×1 IMPLANT
GOWN STRL REUS W/ TWL LRG LVL3 (GOWN DISPOSABLE) ×1 IMPLANT
GOWN STRL REUS W/ TWL XL LVL3 (GOWN DISPOSABLE) ×1 IMPLANT
GOWN STRL REUS W/TWL LRG LVL3 (GOWN DISPOSABLE) ×1
GOWN STRL REUS W/TWL XL LVL3 (GOWN DISPOSABLE) ×1
GUIDEWIRE BALL NOSE AFFIXUS (WIRE) IMPLANT
K-WIRE W/TROCAR TIP 2.5 (WIRE) ×2
KIT STABILIZATION SHOULDER (MISCELLANEOUS) ×1 IMPLANT
KIT TURNOVER KIT A (KITS) ×1 IMPLANT
KWIRE W/TROCAR TIP 2.5 (WIRE) IMPLANT
MANIFOLD NEPTUNE II (INSTRUMENTS) ×1 IMPLANT
MASK FACE SPIDER DISP (MASK) ×1 IMPLANT
MAT ABSORB  FLUID 56X50 GRAY (MISCELLANEOUS) ×1
MAT ABSORB FLUID 56X50 GRAY (MISCELLANEOUS) ×1 IMPLANT
NAIL HUM PROX ANN 8.5X240 LT (Nail) IMPLANT
NS IRRIG 1000ML POUR BTL (IV SOLUTION) ×1 IMPLANT
NS IRRIG 500ML POUR BTL (IV SOLUTION) IMPLANT
PACK ARTHROSCOPY SHOULDER (MISCELLANEOUS) ×1 IMPLANT
PAD ARMBOARD 7.5X6 YLW CONV (MISCELLANEOUS) ×1 IMPLANT
PAD WRAPON POLAR SHDR UNIV (MISCELLANEOUS) ×1 IMPLANT
SCREW AFFIXUS BLUNT TIP 4X44 (Screw) IMPLANT
SCREW BLUNT TIP AFFIXUS 4X40 (Screw) IMPLANT
SCREW BLUNT TIP AFFIXUS 4X42 (Screw) IMPLANT
SCREW CORT AFFIXUS 4X26 (Screw) IMPLANT
SLING ULTRA II LG (MISCELLANEOUS) ×1 IMPLANT
SPONGE T-LAP 18X18 ~~LOC~~+RFID (SPONGE) ×1 IMPLANT
STAPLER SKIN PROX 35W (STAPLE) ×1 IMPLANT
STRAP SAFETY 5IN WIDE (MISCELLANEOUS) ×1 IMPLANT
STRIP CLOSURE SKIN 1/2X4 (GAUZE/BANDAGES/DRESSINGS) ×1 IMPLANT
SUT ETHIBOND NAB MO 7 #0 18IN (SUTURE) IMPLANT
SUT PROLENE 4 0 PS 2 18 (SUTURE) IMPLANT
SUT VIC AB 0 CT1 36 (SUTURE) ×2 IMPLANT
SUT VIC AB 2-0 CT1 27 (SUTURE) ×1
SUT VIC AB 2-0 CT1 TAPERPNT 27 (SUTURE) ×2 IMPLANT
TAPE MICROFOAM 4IN (TAPE) ×1 IMPLANT
TRAP FLUID SMOKE EVACUATOR (MISCELLANEOUS) ×1 IMPLANT
WATER STERILE IRR 500ML POUR (IV SOLUTION) ×1 IMPLANT
WRAPON POLAR PAD SHDR UNIV (MISCELLANEOUS) ×1

## 2022-06-02 NOTE — Addendum Note (Signed)
Addendum  created 06/02/22 1912 by Iran Ouch, MD   Order list changed, Pharmacy for encounter modified

## 2022-06-02 NOTE — Op Note (Signed)
12/04/2018  4:27 PM  Patient:   Deborah Obrien  Pre-Op Diagnosis:   Closed comminuted displaced left humeral shaft fracture.  Post-Op Diagnosis:   Same  Procedure:   Intramedullary nailing of closed displaced comminuted left humeral shaft fracture.  Surgeon:   Pascal Lux, MD  Assistant:   Cameron Proud, PA-C  Anesthesia:   GET  Findings:   As above.  Complications:   None  EBL:   100 cc  Fluids:   600 cc crystalloid  TT:   None  Drains:   None  Closure:   Staples  Implants:   Biomet Affixus 8.5 x 240 mm humerus locking nail  Brief Clinical Note:   The patient is a 81 year old female who sustained the above-noted injury 5 days ago when she fell onto her right side while visiting her husband in an assisted living facility. She was brought to the emergency room and subsequently admitted for pain management. Initially, the orthopedic surgeon on-call recommended nonsurgical management of this injury. However, because of significant pain and difficulty handling narcotic pain medication, the family requested a second opinion to discuss possible surgical fixation of this fracture. The patient presents at this time for definitive management of this injury.  Procedure:   The patient was brought into the operating room and lain in the supine position. After adequate general endotracheal intubation and anesthesia were obtained, the patient was repositioned in the beach chair position using the beach chair positioner. The left shoulder and upper extremity were prepped with ChloraPrep solution before being draped sterilely, incorporating the Spider arm holder. Preoperative antibiotics were administered. A timeout was performed to verify the appropriate surgical site.    Utilizing the previous incision, an anterolateral approach to the shoulder was made through an approximately 4-5 cm incision. The incision was carried down through the subcutaneous tissues to expose the deltoid fascia.  The raphae was identified and developed to provide access into the subacromial space. The rotator cuff was inspected and found to be intact.   Under C-arm visualization, a guidewire was positioned at the lateral margin of the articular surface adjacent to the greater tuberosity and advanced into the humeral head in line with the shaft. After its position was verified fluoroscopically in several projections, the guidewire was overreamed with a 9 mm reamer to create the entrance hole. A beaded guidewire was advanced down the proximal humerus. After several attempts, the guidewire was passed across the fracture site to enter the distal portion of the humeral shaft and advanced distally to the olecranon fossa. The guidewire was measured and found to be 250 mm. Therefore, the 240 mm nail was selected. The guidewire was over-reamed with first an 8 mm reamer and progressing to a 10 mm reamer. The 8.5 x 240 mm nail was attached to the appropriate proximal guide system and advanced to reside a few millimeters below the lateral humeral head cortex as verified fluoroscopically.  Three blunt screws of appropriate length were placed into the proximal humerus using the anterolateral, lateral, and posterolateral positions on the guide. Each of these screws were able to be placed through the same anterolateral incision. The proximal locking mechanism was engaged to secure the screws into place in the proximal aspect of the humeral nail.  Attention was directed to the distal aspect of the upper arm. The arm was positioned to maintain neutral rotation and, using the perfect circle technique under fluoroscopic guidance, a single bicortical screw of appropriate length were placed in an anterior  to posterior direction across the distal humerus shaft to engage the distal aspect of the humeral nail. This screw was placed through a separate stab incision and the subcutaneous tissues were bluntly dissected down to the shaft in order  to minimize soft tissue trauma. The adequacy of screw position proximally and distally, as well as the overall fracture alignment, was verified fluoroscopically in AP and lateral projections and found to be excellent. In addition, the humeral head was rotated under fluoroscopic visualization to be sure that no screws penetrated the humeral head.  The wounds were copiously irrigated with sterile saline solution using bulb irrigation before the subcutaneous tissues were closed using 2-0 Vicryl interrupted sutures. The skin was closed using staples. A total of 30 cc of 0.5% Sensorcaine with epinephrine +20 cc of Exparel was injected in and around each of the incision sites to help with postoperative analgesia. Sterile occlusive dressings were applied to each wound before the arm was placed into a shoulder immobilizer. A Polar Care also was applied before the patient was awakened, extubated, and returned to the recovery room in satisfactory condition after tolerating the procedure well.

## 2022-06-02 NOTE — Progress Notes (Signed)
PROGRESS NOTE    Deborah Obrien  AST:[id number] DOB: [birth year] DOA: 05/29/2022 PCP: [name], MD    Brief Narrative:  81 year old female who lives alone with past medical history of atrial fibrillation on Eliquis, GERD, anxiety, anemia, CKD, diabetes mellitus, hypertension, hypothyroidism, obesity presented after a fall and found to have a left humerus fracture.  Orthopedics intially recommended conservative management.  Patient has been a significant amount of pain that has been difficult to get under control.  She does have initial relief to IV narcotics however becomes lethargic and confused after.  Reach out again to Dr. Roland Rack per the patient and son's request.  He indicated he would take patient to the operating room on Thursday 9/21.    Assessment & Plan:   Principal Problem:   Humerus fracture Active Problems:   Permanent atrial fibrillation (HCC)   Restless leg syndrome   Drop in hemoglobin   Gastro-esophageal reflux disease without esophagitis   Dizziness   CAD (coronary artery disease)   Hypothyroidism   Acute kidney injury superimposed on chronic kidney disease (HCC)   Elevated liver function tests   History of gout  * Humerus fracture Patient in a lot of pain requiring IV medication and oral medication to control pain.  Orthopedics reconsulted Plan for operative fixation 9/21 N.p.o. for procedure Hold any blood thinners   Restless leg syndrome PTA gabapentin and Requip   Permanent atrial fibrillation (HCC) Continue amiodarone.  Patient has a pacemaker.  Eliquis currently on hold.  Hold for now as patient on the operating room today.  If hemoglobin stable anticipate restarting on 9/22     Drop in hemoglobin Hemoglobin went from 11.4 down to 8.7 continue to hold Eliquis with head trauma and humerus fracture.  With iron deficiency anemia.   Gastro-esophageal reflux disease without esophagitis Oral PPI   Dizziness Continue to monitor.      CAD (coronary artery disease) Stable.  Holding Eliquis.  Holding pravastatin with slightly elevated liver function test.   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  Creatinine 1.72 on presentation baseline 1.4.  Today's creatinine 1.60   History of gout Continue allopurinol   Elevated liver function tests Continue holding statin   DVT prophylaxis: SCD Code Status: Full Family Communication: Son at bedside 9/20 Disposition Plan: Status is: Inpatient Remains inpatient appropriate because: Humerus fracture.  Intractable pain.  Plan for operative fixation 9/21.   Level of care: Med-Surg  Consultants:  Orthopedics  Procedures:  None  Antimicrobials: None   Subjective: Seen and examined.  Lying in bed.  A little more confused this morning  Objective: Vitals:   06/01/22 1655 06/01/22 2036 06/02/22 0804 06/02/22 1205  BP: (!) 114/36 (!) 117/38 (!) 104/40 (!) 109/42  Pulse: (!) 59 62 (!) 59   Resp: 16 18 16 16   Temp: (!) 97.5 F (36.4 C) 98.2 F (36.8 C) 98.2 F (36.8 C) 97.7 F (36.5 C)  TempSrc:  Oral  Oral  SpO2: 97% 94% 100% 100%  Weight:      Height:        Intake/Output Summary (Last 24 hours) at 06/02/2022 1305 Last data filed at 06/01/2022 2149 Gross per 24 hour  Intake 0 ml  Output 150 ml  Net -150 ml   Filed Weights   05/29/22 1456 05/30/22 0204  Weight: 77.1 kg 78.8 kg    Examination:  General exam: No acute  distress Respiratory system: Clear to auscultation. Respiratory effort normal. Cardiovascular system: S1-S2, RRR, no murmurs, no pedal edema Gastrointestinal system: Soft, NT/ND, normal bowel sounds Central nervous system: Alert and oriented. No focal neurological deficits. Extremities: Pain and decreased ROM left upper extremity Skin: Bruise on forehead Psychiatry: Judgement and insight appear normal. Mood & affect confused.      Data Reviewed: I have personally reviewed following labs and imaging studies  CBC: Recent Labs  Lab 05/29/22 2229 05/30/22 0323 05/31/22 0445 06/01/22 0630 06/02/22 1002  WBC 7.8 6.2 6.3 5.3 3.6*  NEUTROABS 6.0  --   --   --  2.4  HGB 11.1* 9.4* 8.7* 8.4* 8.7*  HCT 35.2* 30.2* 27.3* 26.5* 28.4*  MCV 100.3* 99.7 99.3 101.1* 101.8*  PLT 172 165 143* 125* 106   Basic Metabolic Panel: Recent Labs  Lab 05/29/22 2229 05/30/22 0323 05/31/22 0445 06/01/22 0630 06/02/22 1002  NA 137 136 140 136 134*  K 4.2 4.5 4.6 4.9 5.3*  CL 104 104 106 106 103  CO2 21* 27 26 25 25   GLUCOSE 135* 164* 193* 452* 277*  BUN 41* 42* 40* 46* 59*  CREATININE 1.72* 1.64* 1.60* 1.81* 2.04*  CALCIUM 8.7* 8.2* 8.2* 8.5* 8.4*   GFR: Estimated Creatinine Clearance: 21.5 mL/min (A) (by C-G formula based on SCr of 2.04 mg/dL (H)). Liver Function Tests: Recent Labs  Lab 05/30/22 0323 06/01/22 0630  AST 76* 46*  ALT 50* 41  ALKPHOS 175* 213*  BILITOT 1.6* 1.0  PROT 5.9* 5.8*  ALBUMIN 3.1* 2.8*   No results for input(s): "LIPASE", "AMYLASE" in the last 168 hours. No results for input(s): "AMMONIA" in the last 168 hours. Coagulation Profile: Recent Labs  Lab 06/02/22 1002  INR 1.0   Cardiac Enzymes: No results for input(s): "CKTOTAL", "CKMB", "CKMBINDEX", "TROPONINI" in the last 168 hours. BNP (last 3 results) No results for input(s): "PROBNP" in the last 8760 hours. HbA1C: No results for input(s): "HGBA1C" in the last 72 hours.  CBG: Recent Labs  Lab 06/01/22 1140 06/01/22 1613 06/01/22 2121 06/02/22 0824 06/02/22 1155  GLUCAP 261* 127* 203* 217* 218*   Lipid Profile: No results for input(s): "CHOL", "HDL", "LDLCALC", "TRIG", "CHOLHDL", "LDLDIRECT" in the last 72 hours. Thyroid Function Tests: No results for input(s): "TSH", "T4TOTAL", "FREET4", "T3FREE", "THYROIDAB" in the last 72 hours. Anemia Panel: Recent Labs    05/31/22 0445  FERRITIN 70  TIBC 273  IRON 28    Sepsis Labs: No results for input(s): "PROCALCITON", "LATICACIDVEN" in the last 168 hours.  No results found for this or any previous visit (from the past 240 hour(s)).       Radiology Studies: CT HEAD WO CONTRAST (5MM)  Result Date: 05/31/2022 CLINICAL DATA:  Fall EXAM: CT HEAD WITHOUT CONTRAST TECHNIQUE: Contiguous axial images were obtained from the base of the skull through the vertex without intravenous contrast. RADIATION DOSE REDUCTION: This exam was performed according to the departmental dose-optimization program which includes automated exposure control, adjustment of the mA and/or kV according to patient size and/or use of iterative reconstruction technique. COMPARISON:  CT head 05/29/2022 FINDINGS: Limitations-assessment is slightly limited due to motion artifact. Brain: Within the limitations of motion artifact, there is no evidence of acute infarction, hemorrhage, hydrocephalus, extra-axial collection or mass lesion/mass effect. Vascular: No hyperdense vessel or unexpected calcification. Skull: Normal. Negative for fracture or focal lesion. Hyperostosis frontalis. Sinuses/Orbits: Bilateral lens replacement. Other: None. IMPRESSION: Within the limitations of a motion degraded exam CT evidence of intracranial  injury. Electronically Signed   By: Marin Roberts M.D.   On: 05/31/2022 16:38        Scheduled Meds:  [MAR Hold] allopurinol  150 mg Oral Daily   [MAR Hold] amiodarone  200 mg Oral Daily   [MAR Hold] busPIRone  10 mg Oral BID   [MAR Hold] cyanocobalamin  1,000 mcg Oral Daily   [MAR Hold] DULoxetine  30 mg Oral Daily   [MAR Hold] gabapentin  300 mg Oral TID   [MAR Hold] insulin aspart  0-15 Units Subcutaneous TID WC   [MAR Hold] insulin glargine-yfgn  8 Units Subcutaneous Daily   [MAR Hold] levothyroxine  50 mcg Oral Q0600   [MAR Hold] pantoprazole  40 mg Oral BID   [MAR Hold] rOPINIRole  0.25 mg Oral QHS   [MAR Hold] sodium chloride flush  3 mL Intravenous Q12H    Continuous Infusions:  ceFAZolin     [MAR Hold]  ceFAZolin (ANCEF) IV       LOS: 3 days     Sidney Ace, MD [facility]   If 7PM-7AM, please contact night-coverage  06/02/2022, 1:05 PM

## 2022-06-02 NOTE — Transfer of Care (Signed)
Immediate Anesthesia Transfer of Care Note  Patient: Deborah Obrien  Procedure(s) Performed: INTRAMEDULLARY (IM) NAIL HUMERAL (Left: Arm Upper)  Patient Location: PACU  Anesthesia Type:General  Level of Consciousness: awake  Airway & Oxygen Therapy: Patient Spontanous Breathing and Patient connected to face mask oxygen  Post-op Assessment: Report given to RN and Post -op Vital signs reviewed and stable  Post vital signs: Reviewed and stable  Last Vitals:  Vitals Value Taken Time  BP 126/42 06/02/22 1616  Temp    Pulse 49 06/02/22 1617  Resp 18 06/02/22 1617  SpO2 100 % 06/02/22 1617  Vitals shown include unvalidated device data.  Last Pain:  Vitals:   06/02/22 1205  TempSrc: Oral  PainSc: 8       Patients Stated Pain Goal: 2 ([birth year] 7408)  Complications: No notable events documented.

## 2022-06-02 NOTE — Anesthesia Procedure Notes (Signed)
Procedure Name: Intubation Date/Time: 06/02/2022 1:44 PM  Performed by: Loletha Grayer, CRNAPre-anesthesia Checklist: Patient identified, Emergency Drugs available, Suction available and Patient being monitored Patient Re-evaluated:Patient Re-evaluated prior to induction Oxygen Delivery Method: Circle system utilized Preoxygenation: Pre-oxygenation with 100% oxygen Induction Type: IV induction Ventilation: Mask ventilation without difficulty Laryngoscope Size: McGraph and 3 Grade View: Grade I Tube type: Oral Tube size: 6.5 mm Number of attempts: 1 Airway Equipment and Method: Stylet Placement Confirmation: ETT inserted through vocal cords under direct vision, positive ETCO2 and breath sounds checked- equal and bilateral Secured at: 20 cm Tube secured with: Tape Dental Injury: Teeth and Oropharynx as per pre-operative assessment

## 2022-06-02 NOTE — Anesthesia Preprocedure Evaluation (Signed)
Anesthesia Evaluation  Patient identified by MRN, date of birth, ID band Patient awake    Reviewed: Allergy & Precautions, NPO status , Patient's Chart, lab work & pertinent test results  History of Anesthesia Complications Negative for: history of anesthetic complications  Airway Mallampati: II  TM Distance: >3 FB Neck ROM: Full    Dental  (+) Poor Dentition, Edentulous Upper, Missing   Pulmonary shortness of breath, with exertion and at rest,  History of pulmonary fibrosis, used Oxygen at home in the past but stopped, recently has noticed worsening shortness of breath at home.    breath sounds clear to auscultation       Cardiovascular hypertension, + CAD  Normal cardiovascular exam+ dysrhythmias Atrial Fibrillation + pacemaker  Rhythm:Regular Rate:Normal     Neuro/Psych PSYCHIATRIC DISORDERS Anxiety Depression TIA Neuromuscular disease    GI/Hepatic Neg liver ROS, GERD  ,  Endo/Other  diabetesHypothyroidism   Renal/GU Renal InsufficiencyRenal disease  negative genitourinary   Musculoskeletal  (+) Arthritis ,   Abdominal   Peds negative pediatric ROS (+)  Hematology  (+) Blood dyscrasia, anemia ,   Anesthesia Other Findings   Reproductive/Obstetrics negative OB ROS                            Anesthesia Physical Anesthesia Plan  ASA: 3  Anesthesia Plan: General ETT   Post-op Pain Management: Toradol IV (intra-op)*, Ofirmev IV (intra-op)* and Dilaudid IV   Induction: Intravenous  PONV Risk Score and Plan: 3 and Ondansetron, Dexamethasone and Treatment may vary due to age or medical condition  Airway Management Planned: Oral ETT  Additional Equipment:   Intra-op Plan:   Post-operative Plan: Extubation in OR  Informed Consent: I have reviewed the patients History and Physical, chart, labs and discussed the procedure including the risks, benefits and alternatives for the  proposed anesthesia with the patient or authorized representative who has indicated his/her understanding and acceptance.     Dental Advisory Given  Plan Discussed with: Anesthesiologist, CRNA and Surgeon  Anesthesia Plan Comments: (Patient consented for risks of anesthesia including but not limited to:  - adverse reactions to medications - damage to eyes, teeth, lips or other oral mucosa - nerve damage due to positioning  - sore throat or hoarseness - Damage to heart, brain, nerves, lungs, other parts of body or loss of life  Patient voiced understanding.)        Anesthesia Quick Evaluation

## 2022-06-02 NOTE — Progress Notes (Signed)
PT Cancellation Note  Patient Details Name: Deborah Obrien MRN: [id number] DOB: 1941-05-10   Cancelled Treatment:     PT attempt x 2 this AM. Pt was asleep both times author attempted to see. On second attempt, pt 's son at bedside with pt very emotional. Pt was sobbing uncontrollable and continues to be disoriented to time and situation. Pt reports thinking she already has had surgery. Author did best to reorient pt to current situation. Will hold PT at this time and return when pt is more appropriate. Pt is scheduled to have surgery this afternoon to repair LUE.     Willette Pa 06/02/2022, 9:06 AM

## 2022-06-02 NOTE — Care Management Important Message (Signed)
Important Message  Patient Details  Name: Deborah Obrien MRN: [id number] Date of Birth: 05/14/1941   Medicare Important Message Given:  N/A - LOS <3 / Initial given by admissions     [name] 06/02/2022, 9:12 AM

## 2022-06-02 NOTE — Consult Note (Signed)
ORTHOPAEDIC CONSULTATION  REQUESTING PHYSICIAN: Sidney Ace, MD  Chief Complaint:   Left upper arm pain.  History of Present Illness: Deborah Obrien is a 81 y.o. female with multiple medical problems including atrial fibrillation, coronary artery disease, chronic kidney disease, hypertension, hypothyroidism, gout, osteoporosis, diabetes, carotid artery disease, status post TIA, and osteoporosis who was in her usual state of health 5 days ago when she went to go visit her husband who is in a skilled nursing facility.  Apparently, she put her purse down and turned to see her husband when she lost her balance and fell onto her right side, injuring her right arm.  She was brought to the emergency room where x-rays demonstrated the above-noted injury.  The orthopedist on-call felt that this injury could be managed nonsurgically so the patient was placed into a coaptation splint.  However, the patient has continued to have significant pain and has not tolerating narcotic pain medication well.  Therefore, the family has requested that I get involved and consider surgical intervention, especially as I have worked with the family in the past.  The patient notes significant pain in her arm, but denies any numbness or paresthesias down her arm to her hand.  She does have a history of prior left shoulder issues, including a rotator cuff tear and biceps tendinopathy.  Past Medical History:  Diagnosis Date   A-fib ([place]) 2016   Acid reflux    Acute respiratory failure ([place]) 04/09/2018   Anemia    Anxiety    Atrial flutter ([place]) 08/24/2017   Bowel obstruction (HCC)    Carotid artery disease (HCC)    Chronic kidney disease (CKD) stage G3a/A1, moderately decreased glomerular filtration rate (GFR) between 45-59 mL/min/1.73 square meter and albuminuria creatinine ratio less than 30 mg/g (HCC)    stage IV   Closed fracture of one rib of right  side 05/29/2020   Complete tear of left rotator cuff 10/16/2014   Coronary artery disease    Depression    Diabetes mellitus without complication ([place])    Diarrhea 03/04/2022   Dyspnea    Dysrhythmia    PAROXYSMAL ATRIAL FIBRILLATION   Fatty liver    Gastritis without bleeding    GI bleed    Gout    Hypertension    Hypothyroidism    Low back pain    history of kyphoplasty t12-l1   Morbid obesity ([place])    Multiple gastric polyps    history of gi bleed.  polyps removed   Neuromuscular disorder ([place])    diabetic neuropathy   Osteoarthritis    Osteoporosis    Pacemaker 04/09/2018   MDT- upgraded to duel chamber device Dec 2018- lead revision post upgrade dec 2018   Paroxysmal atrial fibrillation ([place])    Presence of permanent cardiac pacemaker    Sinus node dysfunction ([place]) 08/24/2017   Stroke ([place])    TIA, patient unaware that she had it. no residual   Tendinitis of upper biceps tendon of left shoulder 11/10/2019   TIA (transient ischemic attack)    Type 2 diabetes mellitus with neurological complications ([place]) 04/21/1750   Uncontrolled type 2 diabetes mellitus with hypoglycemia, with long-term current use of insulin ([place]) 03/04/2022   Past Surgical History:  Procedure Laterality Date   [place]     2008. plate & screws    BACK SURGERY  2019   patient describes kyphoplasty for compression fractures, MD  referral said fusion   BREAST BIOPSY Right 08/16   stereo fibroadenomatous change, neg for atypia   BREAST EXCISIONAL BIOPSY Left    neg   CARDIAC CATHETERIZATION Left 08/25/2016   Procedure: Left Heart Cath and Coronary Angiography;  Surgeon: Corey Skains, MD;  Location: [place] [facility];  Service: Cardiovascular;  Laterality: Left;   CARDIOVERSION N/A 06/28/2017   Procedure: CARDIOVERSION;  Surgeon: Corey Skains, MD;  Location: [facility] ORS;  Service: Cardiovascular;  Laterality: N/A;   CARDIOVERSION N/A  02/06/2018   Procedure: CARDIOVERSION;  Surgeon: Josue Hector, MD;  Location: [facility] ENDOSCOPY;  Service: Cardiovascular;  Laterality: N/A;   CARDIOVERSION N/A 07/15/2020   Procedure: CARDIOVERSION;  Surgeon: Jerline Pain, MD;  Location: [facility] ENDOSCOPY;  Service: Cardiovascular;  Laterality: N/A;   CATARACT EXTRACTION, BILATERAL  2012   CHOLECYSTECTOMY     colon blockage  [place]   removed 20% of colon. colon had collapsed   COLONOSCOPY  2016   polyps removed 2016   DORSAL COMPARTMENT RELEASE Left 09/14/2016   Procedure: RELEASE DORSAL COMPARTMENT (DEQUERVAIN);  Surgeon: Dereck Leep, MD;  Location: [facility] ORS;  Service: Orthopedics;  Laterality: Left;   ESOPHAGOGASTRODUODENOSCOPY N/A 01/27/2015   Procedure: ESOPHAGOGASTRODUODENOSCOPY (EGD);  Surgeon: Lollie Sails, MD;  Location: [facility] ENDOSCOPY;  Service: Endoscopy;  Laterality: N/A;   ESOPHAGOGASTRODUODENOSCOPY (EGD) WITH PROPOFOL N/A 07/24/2015   Procedure: ESOPHAGOGASTRODUODENOSCOPY (EGD) WITH PROPOFOL;  Surgeon: Lucilla Lame, MD;  Location: [facility] ENDOSCOPY;  Service: Endoscopy;  Laterality: N/A;   ESOPHAGOGASTRODUODENOSCOPY (EGD) WITH PROPOFOL N/A 04/12/2018   Procedure: ESOPHAGOGASTRODUODENOSCOPY (EGD) WITH PROPOFOL;  Surgeon: Jonathon Bellows, MD;  Location: [facility]COPY;  Service: Gastroenterology;  Laterality: N/A;   ESOPHAGOGASTRODUODENOSCOPY (EGD) WITH PROPOFOL N/A 06/22/2018   Procedure: ESOPHAGOGASTRODUODENOSCOPY (EGD) WITH PROPOFOL;  Surgeon: [name], MD;  Location: [facility] ENDOSCOPY;  Service: Endoscopy;  Laterality: N/A;   ESOPHAGOGASTRODUODENOSCOPY (EGD) WITH PROPOFOL N/A 07/09/2018   Procedure: ESOPHAGOGASTRODUODENOSCOPY (EGD) WITH PROPOFOL with resection of gastric polyps;  Surgeon: Jonathon Bellows, MD;  Location: [facility] ENDOSCOPY;  Service: Gastroenterology;  Laterality: N/A;   ESOPHAGOGASTRODUODENOSCOPY (EGD) WITH PROPOFOL N/A 05/17/2019   Procedure: ESOPHAGOGASTRODUODENOSCOPY (EGD) WITH PROPOFOL;  Surgeon: Jonathon Bellows, MD;  Location: [facility] ENDOSCOPY;  Service: Gastroenterology;  Laterality: N/A;   EUS N/A 06/22/2018   Procedure: UPPER ENDOSCOPIC ULTRASOUND (EUS) RADIAL;  Surgeon: [name], MD;  Location: [facility] ENDOSCOPY;  Service: Endoscopy;  Laterality: N/A;   EYE SURGERY Bilateral    cataract extractions   JOINT REPLACEMENT Bilateral 2014   Bilateral Knee replacement   LEAD REVISION/REPAIR N/A 08/29/2017   Procedure: LEAD REVISION/REPAIR;  Surgeon: Constance Haw, MD;  Location: [place] [facility];  Service: Cardiovascular;  Laterality: N/A;   OOPHORECTOMY     PACEMAKER INSERTION Left 07/01/2015   Procedure: INSERTION PACEMAKER;  Surgeon: Isaias Cowman, MD;  Location: [facility] ORS;  Service: Cardiovascular;  Laterality: Left;   PACEMAKER REVISION N/A 08/28/2017   Procedure: PACEMAKER REVISION;  Surgeon: Deboraha Sprang, MD;  Location: [place] [facility];  Service: Cardiovascular;  Laterality: N/A;   REPLACEMENT TOTAL KNEE BILATERAL     SHOULDER ARTHROSCOPY WITH ROTATOR CUFF REPAIR AND OPEN BICEPS TENODESIS Left 11/21/2019   Procedure: LEFT SHOULDER ARTHROSCOPY WITH DEBRIDEMENT, DECOMPRESSION, ROTATOR CUFF REPAIR AND BICEPS TENOLYSIS;  Surgeon: Corky Mull, MD;  Location: [facility] ORS;  Service: Orthopedics;  Laterality: Left;   SHOULDER ARTHROSCOPY WITH SUBACROMIAL DECOMPRESSION Left 2013   TEE WITHOUT CARDIOVERSION N/A 06/28/2017  Procedure: TRANSESOPHAGEAL ECHOCARDIOGRAM (TEE);  Surgeon: Corey Skains, MD;  Location: [facility] ORS;  Service: Cardiovascular;  Laterality: N/A;   Social History   Socioeconomic History   Marital status: Married    Spouse name: Coley Kulikowski   Number of children: Not on file   Years of education: Not on file   Highest education level: Not on file  Occupational History   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    Comment: retired  Tobacco Use   Smoking status: Never   Smokeless tobacco: Never  Vaping Use   Vaping Use: Never used  Substance and Sexual Activity    Alcohol use: No   Drug use: No   Sexual activity: Yes  Other Topics Concern   Not on file  Social History Narrative   DIABETIC   PACEMAKER   Social Determinants of Health   Financial Resource Strain: Not on file  Food Insecurity: Not on file  Transportation Needs: Not on file  Physical Activity: Not on file  Stress: Not on file  Social Connections: Not on file   Family History  Problem Relation Age of Onset   Diabetes Mellitus II Mother    CAD Mother    Heart attack Mother    Cancer Father        skin   Breast cancer Neg Hx    Allergies  Allergen Reactions   Cephalosporins Other (See Comments), Diarrhea and Nausea Only    Reaction:  Unknown  Other reaction(s): Other (see comments) Other reaction(s): Other (See Comments) Other Reaction: Intolerance  Reaction:  Stomach pain  Other Reaction: Intolerance Reaction:  Unknown    Fish Allergy Shortness Of Breath    All kinds of fish. Severe vomitting even with smell of fish.    Macrolides And Ketolides Other (See Comments)    Severe stomach pain (mycins)   Meperidine Shortness Of Breath, Nausea Only and Other (See Comments)    Stomach pain    Other Nausea Only, Swelling, Rash, Anaphylaxis, Diarrhea, Shortness Of Breath and Other (See Comments)    Allergen: NON-STEROIDS, bextra - hands and feet swell  Reaction:  Stomach pain   Other Reaction: Intolerance   Prednisone Anaphylaxis and Other (See Comments)    (facial swelling/redness/burning)Increases pts blood sugar  Pt states that she is allergic to all steroids.     Shellfish Allergy Anaphylaxis, Shortness Of Breath, Diarrhea, Nausea Only and Rash    Stomach pain    Sulfa Antibiotics Shortness Of Breath, Diarrhea, Nausea Only and Other (See Comments)    Reaction:  Stomach pain   Reaction:  Stomach pain  Other Reaction: Intolerance   Sulfacetamide Sodium Diarrhea, Nausea Only, Other (See Comments) and Shortness Of Breath    Reaction:  Stomach pain    Telbivudine  Other (See Comments)    Unknown reaction   Uloric [Febuxostat] Anaphylaxis    Locks pt's body up    Aspirin Rash and Other (See Comments)    Stomach pain (tolerates lower doses)   Celecoxib Other (See Comments)    GI bleed, weakness, and stomach pain.     Cephalexin Diarrhea, Nausea Only and Other (See Comments)    stomach pain    Codeine Diarrhea, Nausea Only and Other (See Comments)    Reaction:  Stomach pain Pt tolerates morphine    Dilaudid [Hydromorphone Hcl] Other (See Comments)    Reactions: easy to overdose - blood pressure drops really low   Erythromycin Diarrhea, Nausea Only and Other (See Comments)  Reaction:  Stomach pain    Iodinated Contrast Media Rash and Other (See Comments)    Pt states that she is unable to have because she has chronic kidney disease.   CKD Pt states that she is unable to have because she has chronic kidney disease.   CKD    Lipitor [Atorvastatin Calcium] Nausea Only    Reaction: nausea, weakness, pass blood   Oxycodone Other (See Comments)    Reaction:  Stomach pain    Aleve [Naproxen]     Reaction: severe stomach pain   Atorvastatin Nausea Only and Other (See Comments)    Weakness    Doxycycline     Stomach pain    Iodine Rash   Motrin [Ibuprofen]     Reaction: severe stomach pain   Tape Other (See Comments)    Causes pts skin to tear  Pt states that she is able to use paper tape.       Valdecoxib Swelling and Other (See Comments)    Pt states that her hands and feet swell.     Prior to Admission medications   Medication Sig Start Date End Date Taking? Authorizing Provider  allopurinol (ZYLOPRIM) 100 MG tablet Take 200 mg by mouth daily.    [provider]  amiodarone (PACERONE) 200 MG tablet TAKE 1 TABLET BY MOUTH ONCE DAILY . APPOINTMENT REQUIRED FOR FUTURE REFILLS 02/14/22   Deboraha Sprang, MD  amoxicillin (AMOXIL) 500 MG capsule Take 500 mg by mouth 3 (three) times daily. Patient not taking: Reported on 06/01/2022  05/18/22   [provider]  apixaban (ELIQUIS) 2.5 MG TABS tablet Take 1 tablet (2.5 mg total) by mouth 2 (two) times daily. 03/06/22   Emeterio Reeve, DO  busPIRone (BUSPAR) 10 MG tablet Take 10 mg by mouth 2 (two) times daily.     [provider]  cholecalciferol (VITAMIN D3) 10 MCG (400 UNIT) TABS tablet Take 400 Units by mouth in the morning and at bedtime.    [provider]  docusate sodium (COLACE) 100 MG capsule Take 200 mg by mouth 2 (two) times daily.    [provider]  DULoxetine (CYMBALTA) 30 MG capsule Take 30 mg by mouth daily.  03/25/19   [provider]  furosemide (LASIX) 20 MG tablet Take 2 tablets (40 mg) by mouth once daily In the morning    [provider]  gabapentin (NEURONTIN) 300 MG capsule Take 1 capsule (300 mg) by mouth three times a day    [provider]  HUMALOG 100 UNIT/ML injection Inject 0-12 Units into the skin 3 (three) times daily with meals. Sliding scale 05/08/22   [provider]  insulin aspart (NOVOLOG) 100 UNIT/ML injection 0-12 Units, Subcutaneous, 3 times daily with meals Check your sugars before meal time and take the insulin as follows based on the sugar readings. CBG < 70: NO INSULIN: eat a snack and recheck sugar soon CBG 70 - 120: 3 units CBG 121 - 150: 4 unit CBG 151 - 200: 5 units CBG 201 - 250: 7 units CBG 251 - 300: 8 units CBG 301 - 350: 10 units CBG 351 - 400: 12 units CBG > 400: call MD Patient not taking: Reported on 06/01/2022 03/06/22   Emeterio Reeve, DO  insulin glargine-yfgn (SEMGLEE) 100 UNIT/ML injection Inject 0.14 mLs (14 Units total) into the skin daily. Patient not taking: Reported on 06/01/2022 03/07/22   Emeterio Reeve, DO  ketoconazole (NIZORAL) 2 % cream Apply 1  application topically daily as needed (rash/skin irritation.).     [provider]  LANTUS 100 UNIT/ML injection SMARTSIG:14 Unit(s) SUB-Q Daily 05/10/22   [provider]  levothyroxine (SYNTHROID) 50 MCG tablet Take 50 mcg by mouth daily before breakfast.    [provider]  omeprazole (PRILOSEC) 20 MG capsule TAKE 1 CAPSULE BY MOUTH TWICE DAILY BEFORE A MEAL 02/14/22   Vanga, Tally Due, MD  oxybutynin (DITROPAN) 5 MG tablet Take 10 mg by mouth 2 (two) times daily. Breakfast & bedtime    [provider]  Polyethyl Glycol-Propyl Glycol 0.4-0.3 % SOLN Place 1-2 drops into both eyes 4 (four) times daily as needed (dry eyes).     [provider]  pravastatin (PRAVACHOL) 20 MG tablet Take 20 mg by mouth at bedtime.  07/25/18   [provider]  Prenat-FeCbn-FeBisg-FA-Omega (MULTIVITAMIN/MINERALS PO) Take 1 tablet by mouth daily.    [provider]  talc powder Apply 1 application topically as needed (under the breast irritation).    [provider]  traZODone (DESYREL) 50 MG tablet Take 50 mg by mouth at bedtime.  06/07/18   [provider]  vitamin B-12 (CYANOCOBALAMIN) 1000 MCG tablet Take 1,000 mcg by mouth daily.    [provider]   CT HEAD WO CONTRAST (5MM)  Result Date: 05/31/2022 CLINICAL DATA:  Fall EXAM: CT HEAD WITHOUT CONTRAST TECHNIQUE: Contiguous axial images were obtained from the base of the skull through the vertex without intravenous contrast. RADIATION DOSE REDUCTION: This exam was performed according to the departmental dose-optimization program which includes automated exposure control, adjustment of the mA and/or kV according to patient size and/or use of iterative reconstruction technique. COMPARISON:  CT head 05/29/2022 FINDINGS: Limitations-assessment is slightly limited due to motion artifact. Brain: Within the limitations of motion artifact, there is no evidence of acute infarction, hemorrhage, hydrocephalus, extra-axial collection or mass lesion/mass effect. Vascular: No hyperdense vessel or unexpected calcification. Skull: Normal. Negative for fracture or focal lesion.  Hyperostosis frontalis. Sinuses/Orbits: Bilateral lens replacement. Other: None. IMPRESSION: Within the limitations of a motion degraded exam CT evidence of intracranial injury. Electronically Signed   By: Marin Roberts M.D.   On: 05/31/2022 16:38    Positive ROS: All other systems have been reviewed and were otherwise negative with the exception of those mentioned in the HPI and as above.  Physical Exam: General:  Alert, no acute distress Psychiatric:  Patient is competent for consent with normal mood and affect   Cardiovascular:  No pedal edema Respiratory:  No wheezing, non-labored breathing GI:  Abdomen is soft and non-tender Skin:  No lesions in the area of chief complaint Neurologic:  Sensation intact distally Lymphatic:  No axillary or cervical lymphadenopathy  Orthopedic Exam:  Orthopedic examination is limited to the left upper extremity and hand.  The upper arm is in a coaptation splint and supported with a shoulder immobilizer.  The areas of skin visible exhibit moderate swelling and ecchymosis, but otherwise appear to be intact.  No attempt is made to move either the elbow or shoulder.  However, she is able to actively flex and extend her wrist and all digits, and is able to extend her thumb, indicating intact radial, median, and ulnar nerve function.  Sensation is intact to light touch to all distributions.  She has good capillary refill to her hand.  X-rays:  Recent x-rays of the left humerus are available for review and have been reviewed by myself.  These films demonstrate  a three-part proximal humeral shaft fracture with a large butterfly fragment.  No significant degenerative changes of the left shoulder or elbow joints are identified.  No lytic lesions or other acute bony abnormalities are identified.  Assessment: Closed displaced comminuted left humeral shaft fracture.  Plan: The treatment options, including both surgical and nonsurgical choices, have been discussed in  detail with the patient and her son. The patient and her son would like to proceed with surgical intervention to include an intramedullary nailing of the left humeral shaft fracture. The procedure was discussed in detail, as were the potential risks (including bleeding, infection, nerve and/or blood vessel injury, persistent or recurrent pain, loosening or failure of the components, leg length inequality, dislocation, need for further surgery, blood clots, strokes, heart attacks or arrhythmias, pneumonia, etc.) and benefits of the surgical procedure. The patient and her son state their understanding and agree to proceed. A formal written consent will be obtained by the nursing staff.  Thank you for asking me to participate in the care of this most pleasant and unfortunate woman.  I will be happy to follow her with you.   Pascal Lux, MD  Beeper #:  [phone]  06/02/2022 12:44 PM

## 2022-06-02 NOTE — Plan of Care (Signed)

## 2022-06-02 NOTE — Anesthesia Postprocedure Evaluation (Addendum)
Anesthesia Post Note  Patient: Deborah Obrien  Procedure(s) Performed: INTRAMEDULLARY (IM) NAIL HUMERAL (Left: Arm Upper)  Patient location during evaluation: PACU Anesthesia Type: General Level of consciousness: awake and alert Pain management: pain level controlled Vital Signs Assessment: post-procedure vital signs reviewed and stable Respiratory status: spontaneous breathing, nonlabored ventilation and respiratory function stable Cardiovascular status: blood pressure returned to baseline and stable Postop Assessment: no apparent nausea or vomiting Anesthetic complications: no Comments: Pt with intermittent pacer spikes on tele and EKG with no ventricular response.  I have concern for possible device damage. Dr. Gilmer Mor was called and he stated it may be artifact and to continue to observe. He recommended seeking assistance from EP in the morning if problems continues. Device will be interrogated by manufacturer now. Hospitalist will be alerted and telemetry may be ordered. Labs will be checked. Pt is in Afib with rate 90-100's. Pain will be treated first with possible need for beta blockade.  Update: Device interrogation reports 64 antitachycardia therapies administered. Cardiology was reconsulted. Dr. Radford Pax reviewed the information and discussed the case with on-call EP. EP recommended placing a magnet on the patient's pacemaker and they will evaluate tomorrow.   No notable events documented.   Last Vitals:  Vitals:   06/02/22 1715 06/02/22 1730  BP: (!) 120/44 (!) 124/54  Pulse: (!) 115 (!) 107  Resp: 17 19  Temp:    SpO2: 99% 100%    Last Pain:  Vitals:   06/02/22 1205  TempSrc: Oral  PainSc: 8                  [place] Ouch

## 2022-06-02 NOTE — Inpatient Diabetes Management (Signed)
Inpatient Diabetes Program Recommendations  AACE/ADA: New Consensus Statement on Inpatient Glycemic Control (2015)  Target Ranges:  Prepandial:   less than 140 mg/dL      Peak postprandial:   less than 180 mg/dL (1-2 hours)      Critically ill patients:  140 - 180 mg/dL   Lab Results  Component Value Date   GLUCAP 218 (H) 06/02/2022   HGBA1C 6.2 (H) 05/29/2022    Review of Glycemic Control  Latest Reference Range & Units 06/01/22 07:37 06/01/22 11:40 06/01/22 16:13 06/01/22 21:21 06/02/22 08:24 06/02/22 11:55  Glucose-Capillary 70 - 99 mg/dL 419 (H) 261 (H) 127 (H) 203 (H) 217 (H) 218 (H)   Diabetes history: DM 2 Outpatient Diabetes medications: Humalog 0-12 units tid, Semglee 14 units Daily Current orders for Inpatient glycemic control:  Semglee 8 units Daily Novolog 0-15 units tid  Inpatient Diabetes Program Recommendations:    -   consider increasing Semglee to 12 units   Thanks, Tama Headings RN, MSN, BC-ADM Inpatient Diabetes Coordinator Team Pager [phone] (8a-5p)

## 2022-06-03 ENCOUNTER — Encounter: Payer: Self-pay | Admitting: Surgery

## 2022-06-03 ENCOUNTER — Inpatient Hospital Stay: Payer: Medicare Other

## 2022-06-03 DIAGNOSIS — S42202S Unspecified fracture of upper end of left humerus, sequela: Secondary | ICD-10-CM | POA: Diagnosis not present

## 2022-06-03 LAB — HEMOGLOBIN AND HEMATOCRIT, BLOOD
HCT: 29.9 % — ABNORMAL LOW (ref 36.0–46.0)
HCT: 29.5 % — ABNORMAL LOW (ref 36.0–46.0)
HCT: 26.3 % — ABNORMAL LOW (ref 36.0–46.0)
HCT: 31.7 % — ABNORMAL LOW (ref 36.0–46.0)
Hemoglobin: 9.1 g/dL — ABNORMAL LOW (ref 12.0–15.0)
Hemoglobin: 9.2 g/dL — ABNORMAL LOW (ref 12.0–15.0)
Hemoglobin: 7.9 g/dL — ABNORMAL LOW (ref 12.0–15.0)
Hemoglobin: 10.1 g/dL — ABNORMAL LOW (ref 12.0–15.0)

## 2022-06-03 LAB — BASIC METABOLIC PANEL
Anion gap: 15 (ref 5–15)
BUN: 53 mg/dL — ABNORMAL HIGH (ref 8–23)
CO2: 16 mmol/L — ABNORMAL LOW (ref 22–32)
Calcium: 7.9 mg/dL — ABNORMAL LOW (ref 8.9–10.3)
Chloride: 106 mmol/L (ref 98–111)
Creatinine, Ser: 1.83 mg/dL — ABNORMAL HIGH (ref 0.44–1.00)
GFR, Estimated: 27 mL/min — ABNORMAL LOW (ref >60)
Glucose, Bld: 287 mg/dL — ABNORMAL HIGH (ref 70–99)
Potassium: 5.7 mmol/L — ABNORMAL HIGH (ref 3.5–5.1)
Sodium: 137 mmol/L (ref 135–145)

## 2022-06-03 LAB — GLUCOSE, CAPILLARY
Glucose-Capillary: 254 mg/dL — ABNORMAL HIGH (ref 70–99)
Glucose-Capillary: 282 mg/dL — ABNORMAL HIGH (ref 70–99)
Glucose-Capillary: 280 mg/dL — ABNORMAL HIGH (ref 70–99)
Glucose-Capillary: 199 mg/dL — ABNORMAL HIGH (ref 70–99)
Glucose-Capillary: 162 mg/dL — ABNORMAL HIGH (ref 70–99)

## 2022-06-03 LAB — TROPONIN I (HIGH SENSITIVITY)
Troponin I (High Sensitivity): 66 ng/L — ABNORMAL HIGH (ref <18)
Troponin I (High Sensitivity): 69 ng/L — ABNORMAL HIGH (ref <18)

## 2022-06-03 LAB — PREPARE RBC (CROSSMATCH)

## 2022-06-03 MED ORDER — CEFAZOLIN SODIUM-DEXTROSE 2-4 GM/100ML-% IV SOLN
2.0000 g | Freq: Three times a day (TID) | INTRAVENOUS | Status: AC
  Administered 2022-06-03 (×3): 2 g via INTRAVENOUS
  Filled 2022-06-03 (×3): qty 100

## 2022-06-03 MED ORDER — SODIUM CHLORIDE 0.9% IV SOLUTION: Freq: Once | INTRAVENOUS | Status: DC

## 2022-06-03 MED ORDER — OXYCODONE HCL 5 MG PO TABS
5.0000 mg | ORAL_TABLET | ORAL | Status: DC | PRN
  Administered 2022-06-03 – 2022-06-08 (×16): 5 mg via ORAL
  Filled 2022-06-03 (×16): qty 1

## 2022-06-03 MED ORDER — ONDANSETRON HCL 4 MG PO TABS: 4.0000 mg | ORAL_TABLET | Freq: Four times a day (QID) | ORAL | Status: DC | PRN

## 2022-06-03 MED ORDER — INSULIN ASPART 100 UNIT/ML IV SOLN
10.0000 [IU] | Freq: Once | INTRAVENOUS | Status: AC
  Administered 2022-06-03: 10 [IU] via INTRAVENOUS
  Filled 2022-06-03: qty 0.1

## 2022-06-03 MED ORDER — BISACODYL 10 MG RE SUPP: 10.0000 mg | Freq: Every day | RECTAL | Status: DC | PRN

## 2022-06-03 MED ORDER — INSULIN GLARGINE-YFGN 100 UNIT/ML ~~LOC~~ SOLN
8.0000 [IU] | Freq: Once | SUBCUTANEOUS | Status: AC
  Administered 2022-06-03: 8 [IU] via SUBCUTANEOUS
  Filled 2022-06-03: qty 0.08

## 2022-06-03 MED ORDER — HYDROMORPHONE HCL 1 MG/ML IJ SOLN
0.5000 mg | INTRAMUSCULAR | Status: DC | PRN
  Administered 2022-06-05: 1 mg via INTRAVENOUS
  Filled 2022-06-03: qty 1

## 2022-06-03 MED ORDER — METOCLOPRAMIDE HCL 5 MG/ML IJ SOLN: 5.0000 mg | Freq: Three times a day (TID) | INTRAMUSCULAR | Status: DC | PRN

## 2022-06-03 MED ORDER — FLEET ENEMA 7-19 GM/118ML RE ENEM: 1.0000 | ENEMA | Freq: Once | RECTAL | Status: DC | PRN

## 2022-06-03 MED ORDER — DEXTROSE 50 % IV SOLN
25.0000 mL | Freq: Once | INTRAVENOUS | Status: AC
  Administered 2022-06-03: 25 mL via INTRAVENOUS
  Filled 2022-06-03: qty 50

## 2022-06-03 MED ORDER — SODIUM ZIRCONIUM CYCLOSILICATE 10 G PO PACK
10.0000 g | PACK | Freq: Once | ORAL | Status: AC
  Administered 2022-06-03: 10 g via ORAL
  Filled 2022-06-03: qty 1

## 2022-06-03 MED ORDER — OXYCODONE HCL 5 MG PO TABS: 5.0000 mg | ORAL_TABLET | Freq: Four times a day (QID) | ORAL | 0 refills | Status: DC | PRN

## 2022-06-03 MED ORDER — HYDROMORPHONE HCL 1 MG/ML IJ SOLN: 0.5000 mg | INTRAMUSCULAR | Status: DC | PRN

## 2022-06-03 MED ORDER — DOCUSATE SODIUM 100 MG PO CAPS
100.0000 mg | ORAL_CAPSULE | Freq: Two times a day (BID) | ORAL | Status: DC
  Administered 2022-06-03 – 2022-06-05 (×4): 100 mg via ORAL
  Filled 2022-06-03 (×5): qty 1

## 2022-06-03 MED ORDER — ONDANSETRON HCL 4 MG/2ML IJ SOLN
4.0000 mg | Freq: Four times a day (QID) | INTRAMUSCULAR | Status: DC | PRN
  Filled 2022-06-03: qty 2

## 2022-06-03 MED ORDER — INSULIN GLARGINE-YFGN 100 UNIT/ML ~~LOC~~ SOLN
16.0000 [IU] | Freq: Every day | SUBCUTANEOUS | Status: DC
  Administered 2022-06-04 – 2022-06-08 (×5): 16 [IU] via SUBCUTANEOUS
  Filled 2022-06-03 (×5): qty 0.16

## 2022-06-03 MED ORDER — METOCLOPRAMIDE HCL 10 MG PO TABS: 5.0000 mg | ORAL_TABLET | Freq: Three times a day (TID) | ORAL | Status: DC | PRN

## 2022-06-03 MED ORDER — MAGNESIUM HYDROXIDE 400 MG/5ML PO SUSP: 30.0000 mL | Freq: Every day | ORAL | Status: DC | PRN

## 2022-06-03 MED ORDER — ENOXAPARIN SODIUM 30 MG/0.3ML IJ SOSY
30.0000 mg | PREFILLED_SYRINGE | INTRAMUSCULAR | Status: DC
  Administered 2022-06-03 – 2022-06-05 (×3): 30 mg via SUBCUTANEOUS
  Filled 2022-06-03 (×3): qty 0.3

## 2022-06-03 NOTE — Progress Notes (Signed)
Inpatient Diabetes Program Recommendations  AACE/ADA: New Consensus Statement on Inpatient Glycemic Control (2015)  Target Ranges:  Prepandial:   less than 140 mg/dL      Peak postprandial:   less than 180 mg/dL (1-2 hours)      Critically ill patients:  140 - 180 mg/dL   Lab Results  Component Value Date   GLUCAP 280 (H) 06/03/2022   HGBA1C 6.2 (H) 05/29/2022    Review of Glycemic Control  Latest Reference Range & Units 06/03/22 06:02 06/03/22 08:28 06/03/22 11:27  Glucose-Capillary 70 - 99 mg/dL 254 (H) 282 (H) 280 (H)   Diabetes history: DM 2 Outpatient Diabetes medications:  Humalog 0-12 units tid with meals, Lantus 14 units daily Current orders for Inpatient glycemic control:  Semglee 8 units daily Novolog 0-15 units tid with meals  Inpatient Diabetes Program Recommendations:   Consider increasing Semglee to 16 units daily starting 06/04/22 (consider one time dose of Semglee 8 units today).   Thanks,  Adah Perl, RN, BC-ADM Inpatient Diabetes Coordinator Pager [phone]  (8a-5p)

## 2022-06-03 NOTE — Progress Notes (Signed)
Dr. Posey Pronto notified due to patients fingers being cold but from the knuckles up the patients left arm was warm. I was advised to monitor it as long as pain was not rapidly increasing.

## 2022-06-03 NOTE — Consult Note (Signed)
    Asked by primary team to review MDT PPM interrogation, ECG, and telemetry related to antitachycardia pacing noted in PACU yesterday evening.  Pt w/ a h/o sinus node dysfunction and PAF on amiodarone and eliquis.  She was admitted 9/17, following mechanical fall and fracture of the L humerus.  Post-op IM nail on 9/21, pt noted to be in afib, and then noted to have brief run of atrial pacing - caught on 12 lead ECG (16:51).  Device interrogated and showed nl device function w/ appropriate and successful ATP delivered for Afib w/ subsequent AV pacing.  Since, pt has had 2 recurrent, rate-controlled afib episodes w/ ATP again @ 20:15 last night and this AM @ 9:30.  On both occasions, she was successfully converted to AV pacing.  Pt asymptomatic throughout and denies any recent c/p, palpitations, dyspnea, presyncope or syncope.  Call with questions.  Murray Hodgkins, NP  06/03/2022, 10:12 AM

## 2022-06-03 NOTE — Progress Notes (Addendum)
Patient admitted to the unit and placed on telemetry. A skin assessment was completed by myself and Hospital doctor. The patient has hematomas and ecchymosis all over her body including the head, arms, chest, legs, and other various parts of the body.

## 2022-06-03 NOTE — Progress Notes (Signed)
A compression dressing was applied to the site to stop the bleeding. Pulses are intact. No signs of compartment syndrome or nerve compression.

## 2022-06-03 NOTE — Progress Notes (Signed)
PT Cancellation Note  Patient Details Name: Deborah Obrien MRN: [id number] DOB: [birth year]   Cancelled Treatment:    Reason Eval/Treat Not Completed: Patient not medically ready.  New PT order received s/p surgery; chart reviewed.  OT reporting that RN requesting to hold therapy today as pt is now having chest pain.  PT will re-attempt PT re-evaluation at a later date/time as medically appropriate.  Leitha Bleak, PT 06/03/22, 1:12 PM

## 2022-06-03 NOTE — Progress Notes (Signed)
Dr. Posey Pronto notified because the patient was saying her fingers on her left hand were numb. Dr. Posey Pronto informed me that Dr. Roland Rack and team would be by at 0700 to assess.

## 2022-06-03 NOTE — Progress Notes (Signed)
Patients arm swelling at a rapid rate. Dr. Posey Pronto notified and I asked him to come assess the patients arm. No complaints of numbness or tingling. Patient states that their is some pain but tolerable.

## 2022-06-03 NOTE — Progress Notes (Signed)
PROGRESS NOTE    Deborah Obrien  JOA:[id number] DOB: 05-05-1941 DOA: 05/29/2022 PCP: [name], MD    Brief Narrative:  81 year old female who lives alone with past medical history of atrial fibrillation on Eliquis, GERD, anxiety, anemia, CKD, diabetes mellitus, hypertension, hypothyroidism, obesity presented after a fall and found to have a left humerus fracture.  Orthopedics intially recommended conservative management.  Patient has been a significant amount of pain that has been difficult to get under control.  She does have initial relief to IV narcotics however becomes lethargic and confused after.  Reach out again to Dr. Roland Rack per the patient and son's request.  He indicated he would take patient to the operating room on Thursday 9/21.  Patient is status post ORIF left humerus on 9/21.  Got called from PACU as patient apparently had some rapidfire pacer spikes.  Cardiology consulted.  Pacer interrogated.  Functioning appropriately.  No concern regarding dislodgment of pacer leads.  Patient's pain is moderate on postoperative day #1.  Seen by orthopedic surgery.  Ecchymoses and edema around surgical site are as expected.    Assessment & Plan:   Principal Problem:   Humerus fracture Active Problems:   Permanent atrial fibrillation (HCC)   Restless leg syndrome   Drop in hemoglobin   Gastro-esophageal reflux disease without esophagitis   Dizziness   CAD (coronary artery disease)   Hypothyroidism   Acute kidney injury superimposed on chronic kidney disease (HCC)   Elevated liver function tests   History of gout  * Humerus fracture Patient in a lot of pain requiring IV medication and oral medication to control pain.  Orthopedics reconsulted Status post left humerus ORIF on 9/21 Tolerated procedure well Postoperative pain moderate Plan: Multimodal pain control Nonweightbearing left upper extremity Up with therapy is much as tolerated Hold p.o. Eliquis at this  time  Restless leg syndrome PTA gabapentin and Requip   Permanent atrial fibrillation ([place]) Called by PACU regarding intermittent pacer spikes noted on telemetry.  Twelve-lead ECG reveals patient converted to atrial fibrillation.  Remained stable and asymptomatic.  No palpitations.  Evaluated by cardiology.  No concern for pacer lead dislodgment at this time.  We will continue telemetry monitoring.  Continue amiodarone.  Continue metoprolol added 9/21.  Eliquis currently on hold.  Restart when appropriate.     Drop in hemoglobin Hemoglobin went from 11.4 down to 8.7 continue to hold Eliquis with head trauma and humerus fracture.  With iron deficiency anemia.   Gastro-esophageal reflux disease without esophagitis Oral PPI   Dizziness Continue to monitor.     CAD (coronary artery disease) Stable.  Holding Eliquis.  Holding pravastatin with slightly elevated liver function test.   Hypothyroidism Continue levothyroxine   Acute kidney injury superimposed on chronic kidney disease stage IIIa (HCC) Acute kidney injury on CKD stage IIIa.  Creatinine 1.72 on presentation baseline 1.4. Creat variable.  Monitor labs.  Avoid nonessential nephrotoxins.   History of gout Continue allopurinol   Elevated liver function tests Continue holding statin   DVT prophylaxis: SCD Code Status: Full Family Communication: Son at bedside 9/20, via phone 9/22 Disposition Plan: Status is: Inpatient Remains inpatient appropriate because: Humerus fracture.  Intractable pain.  Postoperative fixation 9/21  Level of care: Med-Surg  Consultants:  Orthopedics  Procedures:  None  Antimicrobials: None   Subjective: Seen and examined.  Lying in bed.  Pain moderate this morning  Objective: Vitals:   06/02/22 2116 06/02/22 2319 06/03/22 0821 06/03/22 1126  BP: Marland Kitchen)  108/50 (!) 106/49 125/69 (!) 109/40  Pulse: (!) 59 98 (!) 102 61  Resp: 16 18 (!) 21 19  Temp:  97.9 F (36.6 C) 98.3 F (36.8 C)  98.2 F (36.8 C)  TempSrc:  Oral Oral   SpO2: 100% 97% 95% 94%  Weight:      Height:        Intake/Output Summary (Last 24 hours) at 06/03/2022 1259 Last data filed at 06/03/2022 0850 Gross per 24 hour  Intake 1907.52 ml  Output 100 ml  Net 1807.52 ml   Filed Weights   05/29/22 1456 05/30/22 0204  Weight: 77.1 kg 78.8 kg    Examination:  General exam: Mild distress due to pain Respiratory system: Clear to auscultation. Respiratory effort normal. Cardiovascular system: S1-S2, RRR, no murmurs, no pedal edema Gastrointestinal system: Soft, NT/ND, normal bowel sounds Central nervous system: Alert and oriented. No focal neurological deficits. Extremities: Left arm in sling.  Significant ecchymosis around site with swelling Skin: Bruise on forehead Psychiatry: Judgement and insight appear normal. Mood & affect confused.     Data Reviewed: I have personally reviewed following labs and imaging studies  CBC: Recent Labs  Lab 05/29/22 2229 05/30/22 0323 05/31/22 0445 06/01/22 0630 06/02/22 1002 06/02/22 1707 06/03/22 0140 06/03/22 0944  WBC 7.8 6.2 6.3 5.3 3.6* 4.3  --   --   NEUTROABS 6.0  --   --   --  2.4 3.2  --   --   HGB 11.1* 9.4* 8.7* 8.4* 8.7* 8.2* 9.1* 9.2*  HCT 35.2* 30.2* 27.3* 26.5* 28.4* 26.3* 29.9* 29.5*  MCV 100.3* 99.7 99.3 101.1* 101.8* 100.4*  --   --   PLT 172 165 143* 125* 152 149*  --   --    Basic Metabolic Panel: Recent Labs  Lab 05/31/22 0445 06/01/22 0630 06/02/22 1002 06/02/22 1707 06/03/22 0944  NA 140 136 134* 135 137  K 4.6 4.9 5.3* 4.8 5.7*  CL 106 106 103 102 106  CO2 26 25 25 22  16*  GLUCOSE 193* 452* 277* 244* 287*  BUN 40* 46* 59* 55* 53*  CREATININE 1.60* 1.81* 2.04* 1.89* 1.83*  CALCIUM 8.2* 8.5* 8.4* 8.4* 7.9*   GFR: Estimated Creatinine Clearance: 24 mL/min (A) (by C-G formula based on SCr of 1.83 mg/dL (H)). Liver Function Tests: Recent Labs  Lab 05/30/22 0323 06/01/22 0630  AST 76* 46*  ALT 50* 41  ALKPHOS  175* 213*  BILITOT 1.6* 1.0  PROT 5.9* 5.8*  ALBUMIN 3.1* 2.8*   No results for input(s): "LIPASE", "AMYLASE" in the last 168 hours. No results for input(s): "AMMONIA" in the last 168 hours. Coagulation Profile: Recent Labs  Lab 06/02/22 1002  INR 1.0   Cardiac Enzymes: No results for input(s): "CKTOTAL", "CKMB", "CKMBINDEX", "TROPONINI" in the last 168 hours. BNP (last 3 results) No results for input(s): "PROBNP" in the last 8760 hours. HbA1C: No results for input(s): "HGBA1C" in the last 72 hours.  CBG: Recent Labs  Lab 06/02/22 1614 06/02/22 2316 06/03/22 0602 06/03/22 0828 06/03/22 1127  GLUCAP 236* 194* 254* 282* 280*   Lipid Profile: No results for input(s): "CHOL", "HDL", "LDLCALC", "TRIG", "CHOLHDL", "LDLDIRECT" in the last 72 hours. Thyroid Function Tests: No results for input(s): "TSH", "T4TOTAL", "FREET4", "T3FREE", "THYROIDAB" in the last 72 hours. Anemia Panel: No results for input(s): "VITAMINB12", "FOLATE", "FERRITIN", "TIBC", "IRON", "RETICCTPCT" in the last 72 hours.  Sepsis Labs: No results for input(s): "PROCALCITON", "LATICACIDVEN" in the last 168 hours.  No results  found for this or any previous visit (from the past 240 hour(s)).       Radiology Studies: DG Humerus Left  Result Date: 06/02/2022 CLINICAL DATA:  Humeral ORIF EXAM: LEFT HUMERUS - 2+ VIEW COMPARISON:  05/30/2022 FINDINGS: Eight C-arm fluoroscopic images were obtained intraoperatively and submitted for post operative interpretation. Images obtained during ORIF of proximal left humeral diaphyseal fracture. Radiation dose: Not provided. Please see the performing provider's procedural report for further detail. IMPRESSION: ORIF of proximal left humeral diaphyseal fracture. Electronically Signed   By: Davina Poke D.O.   On: 06/02/2022 15:58   DG C-Arm 1-60 Min-No Report  Result Date: 06/02/2022 Fluoroscopy was utilized by the requesting physician.  No radiographic interpretation.         Scheduled Meds:  allopurinol  150 mg Oral Daily   amiodarone  200 mg Oral Daily   busPIRone  10 mg Oral BID   cyanocobalamin  1,000 mcg Oral Daily   docusate sodium  100 mg Oral BID   DULoxetine  30 mg Oral Daily   enoxaparin (LOVENOX) injection  30 mg Subcutaneous Q24H   gabapentin  300 mg Oral TID   insulin aspart  0-15 Units Subcutaneous TID WC   [START ON 06/04/2022] insulin glargine-yfgn  16 Units Subcutaneous Daily   insulin glargine-yfgn  8 Units Subcutaneous Once   levothyroxine  50 mcg Oral Q0600   metoprolol tartrate  12.5 mg Oral BID   pantoprazole  40 mg Oral BID   rOPINIRole  0.25 mg Oral QHS   sodium chloride flush  3 mL Intravenous Q12H   Continuous Infusions:  sodium chloride 75 mL/hr at 06/03/22 0906    ceFAZolin (ANCEF) IV 2 g (06/03/22 0908)     LOS: 4 days     Sidney Ace, MD [facility]   If 7PM-7AM, please contact night-coverage  06/03/2022, 12:59 PM

## 2022-06-03 NOTE — Progress Notes (Signed)
Subjective: 1 Day Post-Op Procedure(s) (LRB): INTRAMEDULLARY (IM) NAIL HUMERAL (Left) Patient reports pain as severe in the left shoulder.  Required Dilaudid last night. Patient is  well but is still experiencing severe pain in the left shoulder following surgery. Plan is to go Skilled nursing facility after hospital stay. Negative for chest pain and shortness of breath Fever: no Gastrointestinal:Negative for nausea and vomiting this morning. Had increased bleeding to the distal incision site last night, pressure dressing applied, reported some N/T in the left hand to the nursing staff.  Objective: Vital signs in last 24 hours: Temp:  [97.7 F (36.5 C)-98.2 F (36.8 C)] 97.9 F (36.6 C) (09/21 2319) Pulse Rate:  [55-115] 98 (09/21 2319) Resp:  [0-26] 18 (09/21 2319) BP: (102-135)/(39-89) 106/49 (09/21 2319) SpO2:  [97 %-100 %] 97 % (09/21 2319)  Intake/Output from previous day:  Intake/Output Summary (Last 24 hours) at 06/03/2022 0804 Last data filed at 06/03/2022 0555 Gross per 24 hour  Intake 1689.08 ml  Output 100 ml  Net 1589.08 ml    Intake/Output this shift: No intake/output data recorded.  Labs: Recent Labs    06/01/22 0630 06/02/22 1002 06/02/22 1707 06/03/22 0140  HGB 8.4* 8.7* 8.2* 9.1*   Recent Labs    06/02/22 1002 06/02/22 1707 06/03/22 0140  WBC 3.6* 4.3  --   RBC 2.79* 2.62*  --   HCT 28.4* 26.3* 29.9*  PLT 152 149*  --    Recent Labs    06/02/22 1002 06/02/22 1707  NA 134* 135  K 5.3* 4.8  CL 103 102  CO2 25 22  BUN 59* 55*  CREATININE 2.04* 1.89*  GLUCOSE 277* 244*  CALCIUM 8.4* 8.4*   Recent Labs    06/02/22 1002  INR 1.0     EXAM General - Patient is Alert and Appropriate Extremity - Patient is wearing a sling to the left arm this morning. Moderate swelling and ecchymosis, expected from injury and recent surgery. No drainage noted to the proximal incision, distal pressure dressing intact without drainage.  Both the upper  and lower portions of the arm are soft to palpation.  ABle to gentle flex and extend the elbow as tolerated today. Radial pulse intact. ABle to make a fist, flex and extend the thumb and fingers at today's visit. No evidence for compartment syndrome to the left arm this morning.  Past Medical History:  Diagnosis Date   A-fib ([place]) 2016   Acid reflux    Acute respiratory failure ([place]) 04/09/2018   Anemia    Anxiety    Atrial flutter ([place]) 08/24/2017   Bowel obstruction (HCC)    Carotid artery disease (HCC)    Chronic kidney disease (CKD) stage G3a/A1, moderately decreased glomerular filtration rate (GFR) between 45-59 mL/min/1.73 square meter and albuminuria creatinine ratio less than 30 mg/g (HCC)    stage IV   Closed fracture of one rib of right side 05/29/2020   Complete tear of left rotator cuff 10/16/2014   Coronary artery disease    Depression    Diabetes mellitus without complication ([place])    Diarrhea 03/04/2022   Dyspnea    Dysrhythmia    PAROXYSMAL ATRIAL FIBRILLATION   Fatty liver    Gastritis without bleeding    GI bleed    Gout    Hypertension    Hypothyroidism    Low back pain    history of kyphoplasty t12-l1   Morbid obesity ([place])    Multiple gastric polyps  history of gi bleed.  polyps removed   Neuromuscular disorder ([place])    diabetic neuropathy   Osteoarthritis    Osteoporosis    Pacemaker 04/09/2018   MDT- upgraded to duel chamber device Dec 2018- lead revision post upgrade dec 2018   Paroxysmal atrial fibrillation ([place])    Presence of permanent cardiac pacemaker    Sinus node dysfunction ([place]) 08/24/2017   Stroke ([place])    TIA, patient unaware that she had it. no residual   Tendinitis of upper biceps tendon of left shoulder 11/10/2019   TIA (transient ischemic attack)    Type 2 diabetes mellitus with neurological complications ([place]) 3/88/8280   Uncontrolled type 2 diabetes mellitus with hypoglycemia, with long-term current use of insulin ([place])  03/04/2022    Assessment/Plan: 1 Day Post-Op Procedure(s) (LRB): INTRAMEDULLARY (IM) NAIL HUMERAL (Left) Principal Problem:   Humerus fracture Active Problems:   Permanent atrial fibrillation (HCC)   Gastro-esophageal reflux disease without esophagitis   Dizziness   CAD (coronary artery disease)   Hypothyroidism   Acute kidney injury superimposed on chronic kidney disease (HCC)   Drop in hemoglobin   Elevated liver function tests   History of gout   Restless leg syndrome  Estimated body mass index is 30.77 kg/m as calculated from the following:   Height as of this encounter: 5\' 3"  (1.6 m).   Weight as of this encounter: 78.8 kg. Advance diet Up with therapy  Vitals reviewed this morning and labs.  Hg 9.1 this morning. Patient reports she is intact to light touch this morning, no evidence for compartment syndrome. Bruising and swelling appropriate for after surgery at this time. Up with therapy as much as tolerated this morning. Patient will need to go to SNF following discharge.  DVT Prophylaxis - Lovenox and SCDs Non-weightbearing to the left arm.  Raquel Bonni Neuser, PA-C [facility] Orthopaedic Surgery 06/03/2022, 8:04 AM

## 2022-06-03 NOTE — Progress Notes (Signed)
OT Cancellation Note  Patient Details Name: Deborah Obrien MRN: [id number] DOB: 11-21-1940   Cancelled Treatment:    Reason Eval/Treat Not Completed: Medical issues which prohibited therapy. OT order received and chart reviewed. OT arrived to room for OT evaluation and RN present requesting hold for today as pt is now having chest pains. OT will re-attempt tomorrow.  Darleen Crocker, [place], OTR/L , CBIS ascom [phone]  06/03/22, 1:07 PM

## 2022-06-03 NOTE — Progress Notes (Signed)
Dr. Posey Pronto notified due to patients surgical incision above the left AC bleeding outside the honeycomb dressing. Large hematoma in that area already and was the hematoma began to get bigger.

## 2022-06-04 DIAGNOSIS — S42202S Unspecified fracture of upper end of left humerus, sequela: Secondary | ICD-10-CM | POA: Diagnosis not present

## 2022-06-04 LAB — GLUCOSE, CAPILLARY
Glucose-Capillary: 229 mg/dL — ABNORMAL HIGH (ref 70–99)
Glucose-Capillary: 211 mg/dL — ABNORMAL HIGH (ref 70–99)
Glucose-Capillary: 136 mg/dL — ABNORMAL HIGH (ref 70–99)
Glucose-Capillary: 39 mg/dL — CL (ref 70–99)
Glucose-Capillary: 40 mg/dL — CL (ref 70–99)
Glucose-Capillary: 79 mg/dL (ref 70–99)

## 2022-06-04 LAB — CBC
HCT: 32.2 % — ABNORMAL LOW (ref 36.0–46.0)
Hemoglobin: 10.1 g/dL — ABNORMAL LOW (ref 12.0–15.0)
MCH: 31.3 pg (ref 26.0–34.0)
MCHC: 31.4 g/dL (ref 30.0–36.0)
MCV: 99.7 fL (ref 80.0–100.0)
Platelets: 163 10*3/uL (ref 150–400)
RBC: 3.23 MIL/uL — ABNORMAL LOW (ref 3.87–5.11)
RDW: 13.9 % (ref 11.5–15.5)
WBC: 6.1 10*3/uL (ref 4.0–10.5)
nRBC: 0.7 % — ABNORMAL HIGH (ref 0.0–0.2)

## 2022-06-04 LAB — TYPE AND SCREEN
ABO/RH(D): A POS
Antibody Screen: NEGATIVE
Unit division: 0

## 2022-06-04 LAB — BASIC METABOLIC PANEL
Anion gap: 10 (ref 5–15)
BUN: 61 mg/dL — ABNORMAL HIGH (ref 8–23)
CO2: 19 mmol/L — ABNORMAL LOW (ref 22–32)
Calcium: 7.9 mg/dL — ABNORMAL LOW (ref 8.9–10.3)
Chloride: 105 mmol/L (ref 98–111)
Creatinine, Ser: 2.23 mg/dL — ABNORMAL HIGH (ref 0.44–1.00)
GFR, Estimated: 22 mL/min — ABNORMAL LOW (ref >60)
Glucose, Bld: 206 mg/dL — ABNORMAL HIGH (ref 70–99)
Potassium: 5 mmol/L (ref 3.5–5.1)
Sodium: 134 mmol/L — ABNORMAL LOW (ref 135–145)

## 2022-06-04 LAB — BPAM RBC
Blood Product Expiration Date: 202310212359
ISSUE DATE / TIME: 202309221749
Unit Type and Rh: 6200

## 2022-06-04 LAB — HEMOGLOBIN AND HEMATOCRIT, BLOOD
HCT: 28.4 % — ABNORMAL LOW (ref 36.0–46.0)
HCT: 27.6 % — ABNORMAL LOW (ref 36.0–46.0)
HCT: 26.9 % — ABNORMAL LOW (ref 36.0–46.0)
Hemoglobin: 9.1 g/dL — ABNORMAL LOW (ref 12.0–15.0)
Hemoglobin: 8.9 g/dL — ABNORMAL LOW (ref 12.0–15.0)
Hemoglobin: 8.7 g/dL — ABNORMAL LOW (ref 12.0–15.0)

## 2022-06-04 LAB — SARS CORONAVIRUS 2 BY RT PCR: SARS Coronavirus 2 by RT PCR: NEGATIVE

## 2022-06-04 MED ORDER — LOPERAMIDE HCL 2 MG PO CAPS
4.0000 mg | ORAL_CAPSULE | ORAL | Status: DC | PRN
  Administered 2022-06-04: 4 mg via ORAL
  Filled 2022-06-04: qty 2

## 2022-06-04 NOTE — Plan of Care (Signed)

## 2022-06-04 NOTE — Progress Notes (Addendum)
Met son of patient while rounding. He shared about his ,other's recent injury and the complications after. Provided support and presence, will follow up.

## 2022-06-04 NOTE — Progress Notes (Signed)
Subjective: 2 Days Post-Op Procedure(s) (LRB): INTRAMEDULLARY (IM) NAIL HUMERAL (Left) Patient reports pain as moderate in the left shoulder.  Complains of some swelling throughout the left forearm hand and digits. Plan is to go Skilled nursing facility after hospital stay. Negative for chest pain and shortness of breath Fever: no Gastrointestinal:Negative for nausea and vomiting this morning.   Objective: Vital signs in last 24 hours: Temp:  [97.9 F (36.6 C)-98.5 F (36.9 C)] 98.3 F (36.8 C) (09/23 0419) Pulse Rate:  [58-102] 86 (09/23 0419) Resp:  [18-22] 18 (09/23 0419) BP: (93-142)/(40-97) 137/46 (09/23 0419) SpO2:  [68 %-100 %] 68 % (09/23 0419)  Intake/Output from previous day:  Intake/Output Summary (Last 24 hours) at 06/04/2022 0623 Last data filed at 06/03/2022 2111 Gross per 24 hour  Intake 518.44 ml  Output 250 ml  Net 268.44 ml    Intake/Output this shift: Total I/O In: 300 [Blood:300] Out: -   Labs: Recent Labs    06/03/22 0140 06/03/22 0944 06/03/22 1328 06/03/22 2153 06/04/22 0240  HGB 9.1* 9.2* 7.9* 10.1* 10.1*   Recent Labs    06/02/22 1707 06/03/22 0140 06/03/22 2153 06/04/22 0240  WBC 4.3  --   --  6.1  RBC 2.62*  --   --  3.23*  HCT 26.3*   < > 31.7* 32.2*  PLT 149*  --   --  163   < > = values in this interval not displayed.   Recent Labs    06/02/22 1707 06/03/22 0944  NA 135 137  K 4.8 5.7*  CL 102 106  CO2 22 16*  BUN 55* 53*  CREATININE 1.89* 1.83*  GLUCOSE 244* 287*  CALCIUM 8.4* 7.9*   Recent Labs    06/02/22 1002  INR 1.0     EXAM General - Patient is Alert and Appropriate Extremity - Patient is wearing a sling to the left arm this morning.  Polar Care intact but cooler with ice. Swelling and ecchymosis present.  Compartments are soft throughout the left upper extremity Incision site is intact, honeycomb dressing clean dry and intact  Both the upper and lower portions of the arm are soft to palpation.  ABle  to gentle flex and extend the elbow as tolerated today. Radial pulse intact.  Unable to make a full fist but able to move digits throughout the left upper extremity No evidence for compartment syndrome  Past Medical History:  Diagnosis Date   A-fib ([place]) 2016   Acid reflux    Acute respiratory failure ([place]) 04/09/2018   Anemia    Anxiety    Atrial flutter ([place]) 08/24/2017   Bowel obstruction (HCC)    Carotid artery disease (HCC)    Chronic kidney disease (CKD) stage G3a/A1, moderately decreased glomerular filtration rate (GFR) between 45-59 mL/min/1.73 square meter and albuminuria creatinine ratio less than 30 mg/g (HCC)    stage IV   Closed fracture of one rib of right side 05/29/2020   Complete tear of left rotator cuff 10/16/2014   Coronary artery disease    Depression    Diabetes mellitus without complication ([place])    Diarrhea 03/04/2022   Dyspnea    Dysrhythmia    PAROXYSMAL ATRIAL FIBRILLATION   Fatty liver    Gastritis without bleeding    GI bleed    Gout    Hypertension    Hypothyroidism    Low back pain    history of kyphoplasty t12-l1   Morbid obesity ([place])    Multiple  gastric polyps    history of gi bleed.  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 no residual   Tendinitis of upper biceps tendon of left shoulder 11/10/2019   TIA (transient ischemic attack)    Type 2 diabetes mellitus with neurological complications ([place]) 4/48/1856   Uncontrolled type 2 diabetes mellitus with hypoglycemia, with long-term current use of insulin ([place]) 03/04/2022    Assessment/Plan: 2 Days Post-Op Procedure(s) (LRB): INTRAMEDULLARY  (IM) NAIL HUMERAL (Left) Principal Problem:   Humerus fracture Active Problems:   Permanent atrial fibrillation (HCC)   Gastro-esophageal reflux disease without esophagitis   Dizziness   CAD (coronary artery disease)   Hypothyroidism   Acute kidney injury superimposed on chronic kidney disease (HCC)   Drop in hemoglobin   Elevated liver function tests   History of gout   Restless leg syndrome  Estimated body mass index is 30.77 kg/m as calculated from the following:   Height as of this encounter: 5\' 3"  (1.6 m).   Weight as of this encounter: 78.8 kg. Advance diet Up with therapy Vital signs stable. Labs are stable Pain controlled. Continue with sling/Polar Care unit left upper extremity. Care manager to assist with discharge to skilled nursing facility   DVT Prophylaxis - Lovenox and SCDs Non-weightbearing to the left arm.  Ronney Asters, PA-C [facility] Orthopaedic Surgery 06/04/2022, 6:23 AM

## 2022-06-04 NOTE — Progress Notes (Signed)
Occupational Therapy Treatment/Reassessment Patient Details Name: Deborah Obrien MRN: [id number] DOB: 1941-04-21 Today's Date: 06/04/2022   History of present illness Pt is an 81 y.o. female presenting to hospital 9/17 s/p fall (tripped and fell over her purse while visiting her husband at his nursing facility); hit L arm and c/o severe pain in middle of L UE that moves up into her shoulder; c/o some pain in R knee; also striking her head.  H/o recent falls.  Imaging showing comminuted and angulated fx of the midshaft of the L humerus (spiral displaced fx L humeral shaft).  Pt now s/p L humeral IM nail on 06/02/22.  PMH includes htn, DM, CHF, a-fib on Eliquis, CKD, anemia, GERD, gout, pacemaker, stroke, and chronic pain syndrome.   OT comments  OT received another referral for OT eval and treatment following surgical intervention for patient's left UE post humerus fracture with ORIF repair.  Pt appears to be near the same level of care when she was evaluated earlier in the week, she continues to be limited by pain in left UE, now in a sling and is NWB on the left UE.  She continues to require increased assistance with transfers, functional mobility, self care tasks and IADL tasks.  She has had issues with diarrhea in the last day and not feeling well, COVID test negative.  She does display moderate edema in the dorsum of her left hand and required assistance with repositioning of arm in the sling, pillow placed under arm/sling to support and encourage decreased edema.  Pt performing ROM of fingers, oppositional movements of thumb to digits and ROM of wrist.  Pt reports arm felt much better after session.  Will continue to work towards goals in plan of care to maximize safety and independence in necessary daily tasks.  She would benefit from STR rehab at discharge.    Recommendations for follow up therapy are one component of a multi-disciplinary discharge planning process, led by the attending  physician.  Recommendations may be updated based on patient status, additional functional criteria and insurance authorization.    Follow Up Recommendations  Skilled nursing-short term rehab (<3 hours/day)    Assistance Recommended at Discharge Frequent or constant Supervision/Assistance  Patient can return home with the following  A lot of help with bathing/dressing/bathroom;Assistance with cooking/housework;Direct supervision/assist for medications management;Assist for transportation;A lot of help with walking and/or transfers;Assistance with feeding   Equipment Recommendations  Other (comment)    Recommendations for Other Services      Precautions / Restrictions Precautions Precautions: Fall Required Braces or Orthoses: Sling Splint/Cast: LUE sling in place Restrictions Weight Bearing Restrictions: Yes LUE Weight Bearing: Non weight bearing       Mobility Bed Mobility Overal bed mobility: Needs Assistance Bed Mobility: Rolling Rolling: Min assist, Mod assist              Transfers                         Balance                                           ADL either performed or assessed with clinical judgement   ADL Overall ADL's : Needs assistance/impaired Eating/Feeding: Set up  General ADL Comments: Pt with diarrhea this date, swabbed for COVID but test was negative.  Nursing notified that patient will require extensive assist to help bathe and clean up after episode of diarrhea.    Extremity/Trunk Assessment Upper Extremity Assessment Upper Extremity Assessment: LUE deficits/detail LUE Deficits / Details: Pt with humerus fracture now status post ORIF of left humerus, NWB.  In sling currently, pain 6/10 and increased with attempts at repositioning.            Vision       Perception     Praxis      Cognition Arousal/Alertness: Awake/alert Behavior During Therapy:  WFL for tasks assessed/performed Overall Cognitive Status: Within Functional Limits for tasks assessed                                 General Comments: Pt alert and oriented, states, "I have seen better days!"  Pain in left UE post surgical pain        Exercises  Pt seen this date for ROM of left fingers, wrist.  Repositioning of left UE in sling for proper fit, positioning of arm for edema control and adjustments to application of polar care to decrease edema.  Pt remains painful in the LUE but reports arm felt better after treatment this date.      Shoulder Instructions       General Comments      Pertinent Vitals/ Pain       Pain Assessment Pain Assessment: 0-10 Pain Score: 6  Breathing: normal Negative Vocalization: none Facial Expression: smiling or inexpressive Body Language: relaxed Consolability: no need to console PAINAD Score: 0  Home Living                                          Prior Functioning/Environment              Frequency  Min 2X/week        Progress Toward Goals  OT Goals(current goals can now be found in the care plan section)  Progress towards OT goals: Progressing toward goals  Acute Rehab OT Goals Patient Stated Goal: pt would like to be more independent with her self care and hopes to eventually return home OT Goal Formulation: With patient Time For Goal Achievement: 06/18/22 Potential to Achieve Goals: Good  Plan Discharge plan remains appropriate    Co-evaluation                 AM-PAC OT "6 Clicks" Daily Activity     Outcome Measure   Help from another person eating meals?: A Little Help from another person taking care of personal grooming?: A Little Help from another person toileting, which includes using toliet, bedpan, or urinal?: A Lot Help from another person bathing (including washing, rinsing, drying)?: A Lot Help from another person to put on and taking off regular upper  body clothing?: A Lot Help from another person to put on and taking off regular lower body clothing?: A Lot 6 Click Score: 14    End of Session    OT Visit Diagnosis: Muscle weakness (generalized) (M62.81);Pain Pain - Right/Left: Left Pain - part of body: Shoulder   Activity Tolerance Patient limited by pain   Patient Left in bed;with call bell/phone within reach;with bed alarm set;with family/visitor present  Nurse Communication Other (comment) (self care needs)        Time: 1540-1600 OT Time Calculation (min): 20 min  Charges: OT General Charges $OT Visit: 1 Visit OT Treatments $Therapeutic Exercise: 8-22 mins  Malaisha Silliman T Rekha Hobbins, OTR/L, CLT   Kayveon Lennartz 06/04/2022, 4:20 PM

## 2022-06-04 NOTE — Progress Notes (Signed)
Physical Therapy Treatment Patient Details Name: Deborah Obrien MRN: [id number] DOB: Sep 12, 1941 Today's Date: 06/04/2022   History of Present Illness Pt is an 81 y.o. female presenting to hospital 9/17 s/p fall (tripped and fell over her purse while visiting her husband at his nursing facility); hit L arm and c/o severe pain in middle of L UE that moves up into her shoulder; c/o some pain in R knee; also striking her head.  H/o recent falls.  Imaging showing comminuted and angulated fx of the midshaft of the L humerus (spiral displaced fx L humeral shaft).  Pt now s/p L humeral IM nail on 06/02/22.  PMH includes htn, DM, CHF, a-fib on Eliquis, CKD, anemia, GERD, gout, pacemaker, stroke, and chronic pain syndrome.    PT Comments    Pt was pleasant and motivated to participate during the session and put forth good effort throughout. Pt able to participate with limited bed mobility activity and therex per below but further mobility deferred secondary to severe diarrhea during the session.  Nursing aware and with pt for hygiene at end of session.  Will attempt to reassess pt next session.  Pt will benefit from PT services in a SNF setting upon discharge to safely address deficits listed in patient problem list for decreased caregiver assistance and eventual return to PLOF.     Recommendations for follow up therapy are one component of a multi-disciplinary discharge planning process, led by the attending physician.  Recommendations may be updated based on patient status, additional functional criteria and insurance authorization.  Follow Up Recommendations  Skilled nursing-short term rehab (<3 hours/day) Can patient physically be transported by private vehicle: No   Assistance Recommended at Discharge Frequent or constant Supervision/Assistance  Patient can return home with the following A lot of help with walking and/or transfers;A lot of help with bathing/dressing/bathroom;Assistance with  cooking/housework;Assist for transportation;Help with stairs or ramp for entrance   Equipment Recommendations  BSC/3in1;Wheelchair (measurements PT);Wheelchair cushion (measurements PT)    Recommendations for Other Services       Precautions / Restrictions Precautions Precautions: Fall Precaution Comments: Aspiration Required Braces or Orthoses: Sling Splint/Cast: LUE sling in place Restrictions Weight Bearing Restrictions: Yes LUE Weight Bearing: Non weight bearing     Mobility  Bed Mobility Overal bed mobility: Needs Assistance Bed Mobility: Rolling Rolling: Min assist, Mod assist         General bed mobility comments: Min to mod A with rolling to the R    Transfers                   General transfer comment: NT secondary to severe diarrhea with exertion    Ambulation/Gait                   Stairs             Wheelchair Mobility    Modified Rankin (Stroke Patients Only)       Balance                                            Cognition Arousal/Alertness: Awake/alert Behavior During Therapy: WFL for tasks assessed/performed Overall Cognitive Status: Within Functional Limits for tasks assessed  Exercises Total Joint Exercises Heel Slides: AROM, Strengthening, Both, 10 reps, Supine Hip ABduction/ADduction: AAROM, Strengthening, Both, 10 reps, Supine Straight Leg Raises: AAROM, Strengthening, Both, 10 reps, Supine Standing Hip Extension: Strengthening, Both, 5 reps Other Exercises Other Exercises: Pt education provided on physiological benefits of activity    General Comments        Pertinent Vitals/Pain Pain Assessment Pain Assessment: 0-10 Pain Score: 6  Pain Location: L UE/shoulder Pain Descriptors / Indicators: Aching, Sore, Grimacing Pain Intervention(s): Repositioned, Premedicated before session, Ice applied, Monitored during session     Home Living                          Prior Function            PT Goals (current goals can now be found in the care plan section) Progress towards PT goals: PT to reassess next treatment    Frequency    7X/week      PT Plan Current plan remains appropriate    Co-evaluation              AM-PAC PT "6 Clicks" Mobility   Outcome Measure  Help needed turning from your back to your side while in a flat bed without using bedrails?: A Lot Help needed moving from lying on your back to sitting on the side of a flat bed without using bedrails?: A Lot Help needed moving to and from a bed to a chair (including a wheelchair)?: A Lot Help needed standing up from a chair using your arms (e.g., wheelchair or bedside chair)?: A Lot Help needed to walk in hospital room?: A Lot Help needed climbing 3-5 steps with a railing? : A Lot 6 Click Score: 12    End of Session   Activity Tolerance: Patient limited by pain;Other (comment) (Session limited by severe diarrhea, nursing aware) Patient left: in bed;with call bell/phone within reach;with bed alarm set;with nursing/sitter in room (Pt left with nursing for hygiene) Nurse Communication: Mobility status PT Visit Diagnosis: Other abnormalities of gait and mobility (R26.89);Muscle weakness (generalized) (M62.81);History of falling (Z91.81);Pain Pain - Right/Left: Left Pain - part of body: Arm     Time: 3716-9678 PT Time Calculation (min) (ACUTE ONLY): 15 min  Charges:  $Therapeutic Exercise: 8-22 mins                    D. Scott Taylinn Brabant PT, DPT 06/04/22, 12:01 PM

## 2022-06-04 NOTE — Progress Notes (Signed)
PROGRESS NOTE    Deborah Obrien  BMW:[id number] DOB: 11/06/1940 DOA: 05/29/2022 PCP: [name], MD    Brief Narrative:  81 year old female who lives alone with past medical history of atrial fibrillation on Eliquis, GERD, anxiety, anemia, CKD, diabetes mellitus, hypertension, hypothyroidism, obesity presented after a fall and found to have a left humerus fracture.  Orthopedics intially recommended conservative management.  Patient has been a significant amount of pain that has been difficult to get under control.  She does have initial relief to IV narcotics however becomes lethargic and confused after.  Reach out again to Dr. Roland Rack per the patient and son's request.  He indicated he would take patient to the operating room on Thursday 9/21.  Patient is status post ORIF left humerus on 9/21.  Got called from PACU as patient apparently had some rapidfire pacer spikes.  Cardiology consulted.  Pacer interrogated.  Functioning appropriately.  No concern regarding dislodgment of pacer leads.  Patient's pain is moderate on postoperative day #1.  Seen by orthopedic surgery.  Ecchymoses and edema around surgical site are as expected.    Assessment & Plan:   Principal Problem:   Humerus fracture Active Problems:   Permanent atrial fibrillation (HCC)   Restless leg syndrome   Drop in hemoglobin   Gastro-esophageal reflux disease without esophagitis   Dizziness   CAD (coronary artery disease)   Hypothyroidism   Acute kidney injury superimposed on chronic kidney disease (HCC)   Elevated liver function tests   History of gout  * Humerus fracture Patient was in a lot of pain requiring IV medication and oral medication to control pain.  Orthopedics reconsulted Status post left humerus ORIF on 9/21 Tolerated procedure well Postoperative pain moderate Significant ecchymoses and edema on surgical site  Plan: Multimodal pain control Nonweightbearing left upper extremity Up with  therapy is much as tolerated Hold p.o. Eliquis at this time  Restless leg syndrome PTA gabapentin and Requip   Permanent atrial fibrillation ([place]) Called by PACU regarding intermittent pacer spikes noted on telemetry.  Twelve-lead ECG reveals patient converted to atrial fibrillation.  Remained stable and asymptomatic.  No palpitations.  Evaluated by cardiology.  No concern for pacer lead dislodgment at this time.  We will continue telemetry monitoring.  Continue amiodarone.  Continue metoprolol added 9/21.  Eliquis currently on hold.  Restart when appropriate.     Drop in hemoglobin Hemoglobin went from 11.4 down to 8.7 continue to hold Eliquis with head trauma and humerus fracture.  With iron deficiency anemia.  Postoperatively anemia is moderate 9.1 on 9/23   Gastro-esophageal reflux disease without esophagitis Oral PPI   Dizziness Continue to monitor.     CAD (coronary artery disease) Stable.  Holding Eliquis.  Holding pravastatin with slightly elevated liver function test.   Hypothyroidism Continue levothyroxine   Acute kidney injury superimposed on chronic kidney disease stage IIIa (HCC) Acute kidney injury on CKD stage IIIa.  Creatinine 1.72 on presentation baseline 1.4. Creat variable.  Monitor labs.  Avoid nonessential nephrotoxins.   History of gout Continue allopurinol   Elevated liver function tests Continue holding statin   DVT prophylaxis: SCD Code Status: Full Family Communication: Son at bedside 9/20, via phone 9/22 Disposition Plan: Status is: Inpatient Remains inpatient appropriate because: Humerus fracture.  Intractable pain.  Postoperative fixation 9/21  Level of care: Med-Surg  Consultants:  Orthopedics  Procedures:  None  Antimicrobials: None   Subjective: Seen and examined.  Lying in bed.  Pain moderate  this morning  Objective: Vitals:   06/04/22 0009 06/04/22 0419 06/04/22 0727 06/04/22 0743  BP: (!) 117/53 (!) 137/46  133/60  Pulse:  (!) 59 86 (!) 59 (!) 58  Resp: 18 18  17   Temp: 98.3 F (36.8 C) 98.3 F (36.8 C)  98 F (36.7 C)  TempSrc:    Oral  SpO2: 99% (!) 68% 100% 99%  Weight:      Height:        Intake/Output Summary (Last 24 hours) at 06/04/2022 1110 Last data filed at 06/04/2022 0934 Gross per 24 hour  Intake 540 ml  Output 450 ml  Net 90 ml   Filed Weights   05/29/22 1456 05/30/22 0204  Weight: 77.1 kg 78.8 kg    Examination:  General exam: No acute distress.  Feels cold Respiratory system: Clear to auscultation. Respiratory effort normal. Cardiovascular system: S1-S2, RRR, no murmurs, no pedal edema Gastrointestinal system: Soft, NT/ND, normal bowel sounds Central nervous system: Alert and oriented. No focal neurological deficits. Extremities: Left arm in sling.  Significant ecchymosis around surgical site with swelling Skin: Bruise on forehead Psychiatry: Judgement and insight appear normal. Mood & affect confused.     Data Reviewed: I have personally reviewed following labs and imaging studies  CBC: Recent Labs  Lab 05/29/22 2229 05/30/22 0323 05/31/22 0445 06/01/22 0630 06/02/22 1002 06/02/22 1707 06/03/22 0140 06/03/22 0944 06/03/22 1328 06/03/22 2153 06/04/22 0240 06/04/22 0840  WBC 7.8   < > 6.3 5.3 3.6* 4.3  --   --   --   --  6.1  --   NEUTROABS 6.0  --   --   --  2.4 3.2  --   --   --   --   --   --   HGB 11.1*   < > 8.7* 8.4* 8.7* 8.2*   < > 9.2* 7.9* 10.1* 10.1* 9.1*  HCT 35.2*   < > 27.3* 26.5* 28.4* 26.3*   < > 29.5* 26.3* 31.7* 32.2* 28.4*  MCV 100.3*   < > 99.3 101.1* 101.8* 100.4*  --   --   --   --  99.7  --   PLT 172   < > 143* 125* 152 149*  --   --   --   --  163  --    < > = values in this interval not displayed.   Basic Metabolic Panel: Recent Labs  Lab 06/01/22 0630 06/02/22 1002 06/02/22 1707 06/03/22 0944 06/04/22 0840  NA 136 134* 135 137 134*  K 4.9 5.3* 4.8 5.7* 5.0  CL 106 103 102 106 105  CO2 25 25 22  16* 19*  GLUCOSE 452* 277* 244*  287* 206*  BUN 46* 59* 55* 53* 61*  CREATININE 1.81* 2.04* 1.89* 1.83* 2.23*  CALCIUM 8.5* 8.4* 8.4* 7.9* 7.9*   GFR: Estimated Creatinine Clearance: 19.7 mL/min (A) (by C-G formula based on SCr of 2.23 mg/dL (H)). Liver Function Tests: Recent Labs  Lab 05/30/22 0323 06/01/22 0630  AST 76* 46*  ALT 50* 41  ALKPHOS 175* 213*  BILITOT 1.6* 1.0  PROT 5.9* 5.8*  ALBUMIN 3.1* 2.8*   No results for input(s): "LIPASE", "AMYLASE" in the last 168 hours. No results for input(s): "AMMONIA" in the last 168 hours. Coagulation Profile: Recent Labs  Lab 06/02/22 1002  INR 1.0   Cardiac Enzymes: No results for input(s): "CKTOTAL", "CKMB", "CKMBINDEX", "TROPONINI" in the last 168 hours. BNP (last 3 results) No results for input(s): "  PROBNP" in the last 8760 hours. HbA1C: No results for input(s): "HGBA1C" in the last 72 hours.  CBG: Recent Labs  Lab 06/03/22 0828 06/03/22 1127 06/03/22 1722 06/03/22 2031 06/04/22 0741  GLUCAP 282* 280* 199* 162* 229*   Lipid Profile: No results for input(s): "CHOL", "HDL", "LDLCALC", "TRIG", "CHOLHDL", "LDLDIRECT" in the last 72 hours. Thyroid Function Tests: No results for input(s): "TSH", "T4TOTAL", "FREET4", "T3FREE", "THYROIDAB" in the last 72 hours. Anemia Panel: No results for input(s): "VITAMINB12", "FOLATE", "FERRITIN", "TIBC", "IRON", "RETICCTPCT" in the last 72 hours.  Sepsis Labs: No results for input(s): "PROCALCITON", "LATICACIDVEN" in the last 168 hours.  No results found for this or any previous visit (from the past 240 hour(s)).       Radiology Studies: DG Chest Port 1 View  Result Date: 06/03/2022 CLINICAL DATA:  Chest pain, recent surgery. EXAM: PORTABLE CHEST 1 VIEW COMPARISON:  Chest x-ray dated 05/29/2022 FINDINGS: Heart size and mediastinal contours appear stable, incompletely imaged. Lungs are clear. LEFT costophrenic angle is incompletely imaged. No pleural effusion or pneumothorax is seen. LEFT chest wall  pacemaker/ICD apparatus appears stable, lowest lead incompletely imaged. Interval surgical changes of LEFT humerus fracture fixation. Visualized portion of the hardware appears appropriately positioned. Expected postsurgical changes within the overlying soft tissues. IMPRESSION: 1. No active disease. No evidence of pneumonia or pulmonary edema. 2. Interval surgical changes of LEFT humerus fracture fixation. Visualized portion of the hardware appears appropriately positioned. Electronically Signed   By: Franki Cabot M.D.   On: 06/03/2022 13:46   DG Humerus Left  Result Date: 06/02/2022 CLINICAL DATA:  Humeral ORIF EXAM: LEFT HUMERUS - 2+ VIEW COMPARISON:  05/30/2022 FINDINGS: Eight C-arm fluoroscopic images were obtained intraoperatively and submitted for post operative interpretation. Images obtained during ORIF of proximal left humeral diaphyseal fracture. Radiation dose: Not provided. Please see the performing provider's procedural report for further detail. IMPRESSION: ORIF of proximal left humeral diaphyseal fracture. Electronically Signed   By: Davina Poke D.O.   On: 06/02/2022 15:58   DG C-Arm 1-60 Min-No Report  Result Date: 06/02/2022 Fluoroscopy was utilized by the requesting physician.  No radiographic interpretation.        Scheduled Meds:  sodium chloride   Intravenous Once   allopurinol  150 mg Oral Daily   amiodarone  200 mg Oral Daily   busPIRone  10 mg Oral BID   cyanocobalamin  1,000 mcg Oral Daily   docusate sodium  100 mg Oral BID   DULoxetine  30 mg Oral Daily   enoxaparin (LOVENOX) injection  30 mg Subcutaneous Q24H   gabapentin  300 mg Oral TID   insulin aspart  0-15 Units Subcutaneous TID WC   insulin glargine-yfgn  16 Units Subcutaneous Daily   levothyroxine  50 mcg Oral Q0600   metoprolol tartrate  12.5 mg Oral BID   pantoprazole  40 mg Oral BID   rOPINIRole  0.25 mg Oral QHS   sodium chloride flush  3 mL Intravenous Q12H   Continuous Infusions:  sodium  chloride 75 mL/hr at 06/04/22 0254     LOS: 5 days     Sidney Ace, MD [facility]   If 7PM-7AM, please contact night-coverage  06/04/2022, 11:10 AM

## 2022-06-04 NOTE — TOC Progression Note (Signed)
Transition of Care [facility]) - Progression Note    Patient Details  Name: Deborah Obrien MRN: [id number] Date of Birth: 1941-05-07  [facility]) CM/SW Contact  Izola Price, RN Phone Number: 06/04/2022, 5:39 PM  Clinical Narrative:  9/23: Inboxed to HUB the updated therapy notes to [employer] to restart insurance authorization. Simmie Davies RN CM 540 pm.           Expected Discharge Plan and Services                                                 Social Determinants of Health (SDOH) Interventions    Readmission Risk Interventions     No data to display

## 2022-06-05 DIAGNOSIS — S42202S Unspecified fracture of upper end of left humerus, sequela: Secondary | ICD-10-CM | POA: Diagnosis not present

## 2022-06-05 LAB — HEMOGLOBIN AND HEMATOCRIT, BLOOD
HCT: 27.3 % — ABNORMAL LOW (ref 36.0–46.0)
Hemoglobin: 8.9 g/dL — ABNORMAL LOW (ref 12.0–15.0)

## 2022-06-05 LAB — CBC
HCT: 32.1 % — ABNORMAL LOW (ref 36.0–46.0)
Hemoglobin: 10.3 g/dL — ABNORMAL LOW (ref 12.0–15.0)
MCH: 31.2 pg (ref 26.0–34.0)
MCHC: 32.1 g/dL (ref 30.0–36.0)
MCV: 97.3 fL (ref 80.0–100.0)
Platelets: 197 10*3/uL (ref 150–400)
RBC: 3.3 MIL/uL — ABNORMAL LOW (ref 3.87–5.11)
RDW: 13.7 % (ref 11.5–15.5)
WBC: 5.4 10*3/uL (ref 4.0–10.5)
nRBC: 1.5 % — ABNORMAL HIGH (ref 0.0–0.2)

## 2022-06-05 LAB — GLUCOSE, CAPILLARY
Glucose-Capillary: 51 mg/dL — ABNORMAL LOW (ref 70–99)
Glucose-Capillary: 82 mg/dL (ref 70–99)
Glucose-Capillary: 126 mg/dL — ABNORMAL HIGH (ref 70–99)
Glucose-Capillary: 160 mg/dL — ABNORMAL HIGH (ref 70–99)
Glucose-Capillary: 212 mg/dL — ABNORMAL HIGH (ref 70–99)
Glucose-Capillary: 157 mg/dL — ABNORMAL HIGH (ref 70–99)

## 2022-06-05 NOTE — Progress Notes (Signed)
Physical Therapy Treatment Patient Details Name: Deborah Obrien MRN: [id number] DOB: [birth year] Today's Date: 06/05/2022   History of Present Illness Pt is an 82 y.o. female presenting to hospital 9/17 s/p fall (tripped and fell over her purse while visiting her husband at his nursing facility); hit L arm and c/o severe pain in middle of L UE that moves up into her shoulder; c/o some pain in R knee; also striking her head.  H/o recent falls.  Imaging showing comminuted and angulated fx of the midshaft of the L humerus (spiral displaced fx L humeral shaft).  Pt now s/p L humeral IM nail on 06/02/22.  PMH includes htn, DM, CHF, a-fib on Eliquis, CKD, anemia, GERD, gout, pacemaker, stroke, and chronic pain syndrome.    PT Comments    Pt required some encouragement to participate along with education on physiological benefits of activity.  Pt then put forth good effort during the session.  Although pt required physical assist with all mobility tasks she ultimately was able to come to standing and ambulate slightly further than during the last session that she was able to ambulate.  In general pt functional status remains very similar to her pre surgery baseline with all goals remaining appropriate.  Pt will benefit from PT services in a SNF setting upon discharge to safely address deficits listed in patient problem list for decreased caregiver assistance and eventual return to PLOF.    Recommendations for follow up therapy are one component of a multi-disciplinary discharge planning process, led by the attending physician.  Recommendations may be updated based on patient status, additional functional criteria and insurance authorization.  Follow Up Recommendations  Skilled nursing-short term rehab (<3 hours/day) Can patient physically be transported by private vehicle: No   Assistance Recommended at Discharge Frequent or constant Supervision/Assistance  Patient can return home with the following A  lot of help with walking and/or transfers;A lot of help with bathing/dressing/bathroom;Assistance with cooking/housework;Assist for transportation;Help with stairs or ramp for entrance   Equipment Recommendations  Other (comment) (TBD at next venue of care)    Recommendations for Other Services       Precautions / Restrictions Precautions Precautions: Fall Precaution Comments: Aspiration Required Braces or Orthoses: Sling Splint/Cast: LUE sling in place Restrictions Weight Bearing Restrictions: Yes LUE Weight Bearing: Non weight bearing     Mobility  Bed Mobility Overal bed mobility: Needs Assistance       Supine to sit: Min assist Sit to supine: Min assist   General bed mobility comments: Min A to support RUE during bed mobility tasks    Transfers Overall transfer level: Needs assistance Equipment used: 1 person hand held assist Transfers: Sit to/from Stand Sit to Stand: Min assist, From elevated surface           General transfer comment: Min extra time and effort but no physical assistance required    Ambulation/Gait Ambulation/Gait assistance: Min Web designer (Feet): 8 Feet Assistive device: 1 person hand held assist Gait Pattern/deviations: Step-to pattern, Trunk flexed Gait velocity: decreased     General Gait Details: Slow cadence but steady with no LOB   Stairs             Wheelchair Mobility    Modified Rankin (Stroke Patients Only)       Balance Overall balance assessment: Needs assistance Sitting-balance support: Single extremity supported, Feet supported Sitting balance-Leahy Scale: Good     Standing balance support: Single extremity supported, During functional activity Standing balance-Leahy  Scale: Fair Standing balance comment: Min to mod lean on hand during amb with hand held assist                            Cognition Arousal/Alertness: Awake/alert Behavior During Therapy: WFL for tasks  assessed/performed Overall Cognitive Status: Within Functional Limits for tasks assessed                                          Exercises Total Joint Exercises Hip ABduction/ADduction: AAROM, Strengthening, Both, 10 reps, Supine Straight Leg Raises: AAROM, Strengthening, Both, 10 reps, Supine Bridges: Strengthening, Both, 5 reps Other Exercises Other Exercises: Pt education provided on physiological benefits of activity    General Comments        Pertinent Vitals/Pain Pain Assessment Pain Assessment: 0-10 Pain Score: 10-Worst pain ever Pain Location: L UE/shoulder Pain Descriptors / Indicators: Aching, Sore, Grimacing Pain Intervention(s): Repositioned, Premedicated before session, Ice applied, Monitored during session    Home Living                          Prior Function            PT Goals (current goals can now be found in the care plan section) Progress towards PT goals: Progressing toward goals    Frequency    7X/week      PT Plan Current plan remains appropriate    Co-evaluation              AM-PAC PT "6 Clicks" Mobility   Outcome Measure  Help needed turning from your back to your side while in a flat bed without using bedrails?: A Lot Help needed moving from lying on your back to sitting on the side of a flat bed without using bedrails?: A Lot Help needed moving to and from a bed to a chair (including a wheelchair)?: A Lot Help needed standing up from a chair using your arms (e.g., wheelchair or bedside chair)?: A Lot Help needed to walk in hospital room?: A Lot Help needed climbing 3-5 steps with a railing? : Total 6 Click Score: 11    End of Session Equipment Utilized During Treatment: Gait belt Activity Tolerance: Patient tolerated treatment well Patient left: in bed;with call bell/phone within reach;with bed alarm set;with nursing/sitter in room (Pt declined up in chair) Nurse Communication: Mobility status  (bed alarm not functional; 10/10 shoulder pain; passed along concern to nursing from patient regarding arm pain associated with how nursing assisted pt to [facility] earlier in day; pt missing buckle on sling) PT Visit Diagnosis: Other abnormalities of gait and mobility (R26.89);Muscle weakness (generalized) (M62.81);History of falling (Z91.81);Pain Pain - Right/Left: Left Pain - part of body: Arm     Time: 1442-1520 PT Time Calculation (min) (ACUTE ONLY): 38 min  Charges:  $Gait Training: 8-22 mins $Therapeutic Exercise: 8-22 mins $Therapeutic Activity: 8-22 mins                    D. Scott Antoinette Haskett PT, DPT 06/05/22, 4:58 PM

## 2022-06-05 NOTE — Progress Notes (Signed)
Subjective: 3 Days Post-Op Procedure(s) (LRB): INTRAMEDULLARY (IM) NAIL HUMERAL (Left) Patient reports pain as moderate in the left shoulder. Swelling slowly improving.  Plan is to go Skilled nursing facility after hospital stay. Negative for chest pain and shortness of breath Fever: no Gastrointestinal:Negative for nausea and vomiting this morning.   Objective: Vital signs in last 24 hours: Temp:  [98.1 F (36.7 C)-98.2 F (36.8 C)] 98.1 F (36.7 C) (09/24 0817) Pulse Rate:  [57-63] 57 (09/24 0817) Resp:  [17-18] 17 (09/24 0817) BP: (97-122)/(53-57) 122/54 (09/24 0817) SpO2:  [95 %-100 %] 97 % (09/24 0817)  Intake/Output from previous day:  Intake/Output Summary (Last 24 hours) at 06/05/2022 0922 Last data filed at 06/04/2022 2300 Gross per 24 hour  Intake 840 ml  Output 350 ml  Net 490 ml    Intake/Output this shift: No intake/output data recorded.  Labs: Recent Labs    06/04/22 0840 06/04/22 1419 06/04/22 1925 06/05/22 0419 06/05/22 0731  HGB 9.1* 8.9* 8.7* 10.3* 8.9*   Recent Labs    06/04/22 0240 06/04/22 0840 06/05/22 0419 06/05/22 0731  WBC 6.1  --  5.4  --   RBC 3.23*  --  3.30*  --   HCT 32.2*   < > 32.1* 27.3*  PLT 163  --  197  --    < > = values in this interval not displayed.   Recent Labs    06/03/22 0944 06/04/22 0840  NA 137 134*  K 5.7* 5.0  CL 106 105  CO2 16* 19*  BUN 53* 61*  CREATININE 1.83* 2.23*  GLUCOSE 287* 206*  CALCIUM 7.9* 7.9*   Recent Labs    06/02/22 1002  INR 1.0     EXAM General - Patient is Alert and Appropriate Extremity - Patient is wearing a sling to the left arm this morning.  Polar Care intact but cooler with ice. Swelling and ecchymosis present.  Compartments are soft throughout the left upper extremity Incision site is intact, honeycomb dressing clean dry and intact  Both the upper and lower portions of the arm are soft to palpation.  ABle to gentle flex and extend the elbow as tolerated  today. Radial pulse intact.  Unable to make a full fist but able to move digits throughout the left upper extremity No evidence for compartment syndrome  Past Medical History:  Diagnosis Date   A-fib ([place]) 2016   Acid reflux    Acute respiratory failure ([place]) 04/09/2018   Anemia    Anxiety    Atrial flutter ([place]) 08/24/2017   Bowel obstruction (HCC)    Carotid artery disease (HCC)    Chronic kidney disease (CKD) stage G3a/A1, moderately decreased glomerular filtration rate (GFR) between 45-59 mL/min/1.73 square meter and albuminuria creatinine ratio less than 30 mg/g (HCC)    stage IV   Closed fracture of one rib of right side 05/29/2020   Complete tear of left rotator cuff 10/16/2014   Coronary artery disease    Depression    Diabetes mellitus without complication ([place])    Diarrhea 03/04/2022   Dyspnea    Dysrhythmia    PAROXYSMAL ATRIAL FIBRILLATION   Fatty liver    Gastritis without bleeding    GI bleed    Gout    Hypertension    Hypothyroidism    Low back pain    history of kyphoplasty t12-l1   Morbid obesity (HCC)    Multiple gastric polyps    history of gi bleed.  polyps removed  Neuromuscular disorder ([place])    diabetic neuropathy   Osteoarthritis    Osteoporosis    Pacemaker 04/09/2018   MDT- upgraded to duel chamber device Dec 2018- lead revision post upgrade dec 2018   Paroxysmal atrial fibrillation ([place])    Presence of permanent cardiac pacemaker    Sinus node dysfunction ([place]) 08/24/2017   Stroke ([place])    TIA, patient unaware that she had it. no residual   Tendinitis of upper biceps tendon of left shoulder 11/10/2019   TIA (transient ischemic attack)    Type 2 diabetes mellitus with neurological complications ([place]) 9/38/1017   Uncontrolled type 2 diabetes mellitus with hypoglycemia, with long-term current use of insulin ([place]) 03/04/2022    Assessment/Plan: 3 Days Post-Op Procedure(s) (LRB): INTRAMEDULLARY (IM) NAIL HUMERAL (Left) Principal Problem:    Humerus fracture Active Problems:   Permanent atrial fibrillation (HCC)   Gastro-esophageal reflux disease without esophagitis   Dizziness   CAD (coronary artery disease)   Hypothyroidism   Acute kidney injury superimposed on chronic kidney disease (HCC)   Drop in hemoglobin   Elevated liver function tests   History of gout   Restless leg syndrome  Estimated body mass index is 30.77 kg/m as calculated from the following:   Height as of this encounter: 5\' 3"  (1.6 m).   Weight as of this encounter: 78.8 kg. Advance diet Up with therapy Vital signs stable. Labs are stable, Hgb trending up Pain controlled. Continue with sling/Polar Care unit left upper extremity. Care manager to assist with discharge to skilled nursing facility   DVT Prophylaxis - Lovenox and SCDs Non-weightbearing to the left arm.  Ronney Asters, PA-C [facility] Orthopaedic Surgery 06/05/2022, 9:22 AM

## 2022-06-05 NOTE — Progress Notes (Signed)
PROGRESS NOTE    Deborah Obrien  KZL:[id number] DOB: June 27, 1941 DOA: 05/29/2022 PCP: [name], MD    Brief Narrative:  81 year old female who lives alone with past medical history of atrial fibrillation on Eliquis, GERD, anxiety, anemia, CKD, diabetes mellitus, hypertension, hypothyroidism, obesity presented after a fall and found to have a left humerus fracture.  Orthopedics intially recommended conservative management.  Patient has been a significant amount of pain that has been difficult to get under control.  She does have initial relief to IV narcotics however becomes lethargic and confused after.  Reach out again to Dr. Roland Rack per the patient and son's request.  He indicated he would take patient to the operating room on Thursday 9/21.  Patient is status post ORIF left humerus on 9/21.  Got called from PACU as patient apparently had some rapidfire pacer spikes.  Cardiology consulted.  Pacer interrogated.  Functioning appropriately.  No concern regarding dislodgment of pacer leads.  Postoperative course been complicated by significant edema and ecchymosis of left upper extremity.  This is gradually improving.  Seen by orthopedics on a daily basis    Assessment & Plan:   Principal Problem:   Humerus fracture Active Problems:   Permanent atrial fibrillation (HCC)   Restless leg syndrome   Drop in hemoglobin   Gastro-esophageal reflux disease without esophagitis   Dizziness   CAD (coronary artery disease)   Hypothyroidism   Acute kidney injury superimposed on chronic kidney disease (HCC)   Elevated liver function tests   History of gout  * Humerus fracture Patient was in a lot of pain requiring IV medication and oral medication to control pain.  Orthopedics reconsulted Status post left humerus ORIF on 9/21 Tolerated procedure well Postoperative pain moderate Significant ecchymoses and edema on surgical site, slowly improving  Plan: Multimodal pain  control Nonweightbearing left upper extremity Up with therapy is much as tolerated Plan to restart p.o. Eliquis 9/25  Restless leg syndrome PTA gabapentin and Requip   Permanent atrial fibrillation ([place]) Called by PACU regarding intermittent pacer spikes noted on telemetry.  Twelve-lead ECG reveals patient converted to atrial fibrillation.  Remained stable and asymptomatic.  No palpitations.  Evaluated by cardiology.  No concern for pacer lead dislodgment at this time.  We will continue telemetry monitoring.  Continue amiodarone.  Continue metoprolol added 9/21.  Eliquis currently on hold.  Tentative plan to restart 9/25   Drop in hemoglobin Hemoglobin went from 11.4 down to 8.7 continue to hold Eliquis with head trauma and humerus fracture.  With iron deficiency anemia.  Postoperatively anemia is moderate 9.1 on 9/23   Gastro-esophageal reflux disease without esophagitis Oral PPI   Dizziness Continue to monitor.     CAD (coronary artery disease) Stable.  Holding Eliquis.  Holding pravastatin with slightly elevated liver function test.   Hypothyroidism Continue levothyroxine   Acute kidney injury superimposed on chronic kidney disease stage IIIa (HCC) Acute kidney injury on CKD stage IIIa.  Creatinine 1.72 on presentation baseline 1.4. Creat variable.  Monitor labs.  Avoid nonessential nephrotoxins.   History of gout Continue allopurinol   Elevated liver function tests Continue holding statin   DVT prophylaxis: SCD Code Status: Full Family Communication: Son at bedside 9/20, via phone 9/22, left VM on 9/24 Disposition Plan: Status is: Inpatient Remains inpatient appropriate because: Humerus fracture.  Intractable pain.  Postoperative fixation 9/21.  Hopeful to have medical ready for discharge in 24 to 48 hours  Level of care: Med-Surg  Consultants:  Orthopedics  Procedures:  None  Antimicrobials: None   Subjective: Seen and examined.  Sitting up in chair.  Pain  mild to moderate  Objective: Vitals:   06/04/22 0743 06/04/22 1123 06/04/22 1752 06/05/22 0817  BP: 133/60 (!) 118/57 (!) 97/53 (!) 122/54  Pulse: (!) 58 63 (!) 58 (!) 57  Resp: 17 18  17   Temp: 98 F (36.7 C) 98.1 F (36.7 C) 98.2 F (36.8 C) 98.1 F (36.7 C)  TempSrc: Oral Oral    SpO2: 99% 100% 95% 97%  Weight:      Height:        Intake/Output Summary (Last 24 hours) at 06/05/2022 1142 Last data filed at 06/05/2022 0930 Gross per 24 hour  Intake 600 ml  Output 1100 ml  Net -500 ml   Filed Weights   05/29/22 1456 05/30/22 0204  Weight: 77.1 kg 78.8 kg    Examination:  General exam: No acute distress.  Appears fatigued Respiratory system: Clear to auscultation. Respiratory effort normal. Cardiovascular system: S1-S2, RRR, no murmurs, no pedal edema Gastrointestinal system: Soft, NT/ND, normal bowel sounds Central nervous system: Alert and oriented. No focal neurological deficits. Extremities: Left arm in sling.  Significant ecchymosis around surgical site with swelling, improving over interval Skin: Bruise on forehead Psychiatry: Judgement and insight appear normal. Mood & affect confused.     Data Reviewed: I have personally reviewed following labs and imaging studies  CBC: Recent Labs  Lab 05/29/22 2229 05/30/22 0323 06/01/22 0630 06/02/22 1002 06/02/22 1707 06/03/22 0140 06/04/22 0240 06/04/22 0840 06/04/22 1419 06/04/22 1925 06/05/22 0419 06/05/22 0731  WBC 7.8   < > 5.3 3.6* 4.3  --  6.1  --   --   --  5.4  --   NEUTROABS 6.0  --   --  2.4 3.2  --   --   --   --   --   --   --   HGB 11.1*   < > 8.4* 8.7* 8.2*   < > 10.1* 9.1* 8.9* 8.7* 10.3* 8.9*  HCT 35.2*   < > 26.5* 28.4* 26.3*   < > 32.2* 28.4* 27.6* 26.9* 32.1* 27.3*  MCV 100.3*   < > 101.1* 101.8* 100.4*  --  99.7  --   --   --  97.3  --   PLT 172   < > 125* 152 149*  --  163  --   --   --  197  --    < > = values in this interval not displayed.   Basic Metabolic Panel: Recent Labs   Lab 06/01/22 0630 06/02/22 1002 06/02/22 1707 06/03/22 0944 06/04/22 0840  NA 136 134* 135 137 134*  K 4.9 5.3* 4.8 5.7* 5.0  CL 106 103 102 106 105  CO2 25 25 22  16* 19*  GLUCOSE 452* 277* 244* 287* 206*  BUN 46* 59* 55* 53* 61*  CREATININE 1.81* 2.04* 1.89* 1.83* 2.23*  CALCIUM 8.5* 8.4* 8.4* 7.9* 7.9*   GFR: Estimated Creatinine Clearance: 19.7 mL/min (A) (by C-G formula based on SCr of 2.23 mg/dL (H)). Liver Function Tests: Recent Labs  Lab 05/30/22 0323 06/01/22 0630  AST 76* 46*  ALT 50* 41  ALKPHOS 175* 213*  BILITOT 1.6* 1.0  PROT 5.9* 5.8*  ALBUMIN 3.1* 2.8*   No results for input(s): "LIPASE", "AMYLASE" in the last 168 hours. No results for input(s): "AMMONIA" in the last 168 hours. Coagulation Profile: Recent Labs  Lab 06/02/22 1002  INR 1.0   Cardiac Enzymes: No results for input(s): "CKTOTAL", "CKMB", "CKMBINDEX", "TROPONINI" in the last 168 hours. BNP (last 3 results) No results for input(s): "PROBNP" in the last 8760 hours. HbA1C: No results for input(s): "HGBA1C" in the last 72 hours.  CBG: Recent Labs  Lab 06/04/22 2139 06/05/22 0253 06/05/22 0344 06/05/22 0803 06/05/22 1127  GLUCAP 79 51* 82 126* 160*   Lipid Profile: No results for input(s): "CHOL", "HDL", "LDLCALC", "TRIG", "CHOLHDL", "LDLDIRECT" in the last 72 hours. Thyroid Function Tests: No results for input(s): "TSH", "T4TOTAL", "FREET4", "T3FREE", "THYROIDAB" in the last 72 hours. Anemia Panel: No results for input(s): "VITAMINB12", "FOLATE", "FERRITIN", "TIBC", "IRON", "RETICCTPCT" in the last 72 hours.  Sepsis Labs: No results for input(s): "PROCALCITON", "LATICACIDVEN" in the last 168 hours.  Recent Results (from the past 240 hour(s))  SARS Coronavirus 2 by RT PCR (hospital order, performed in [facility] hospital lab) *cepheid single result test* Anterior Nasal Swab     Status: None   Collection Time: 06/04/22  2:00 PM   Specimen: Anterior Nasal Swab  Result Value Ref  Range Status   SARS Coronavirus 2 by RT PCR NEGATIVE NEGATIVE Final    Comment: (NOTE) SARS-CoV-2 target nucleic acids are NOT DETECTED.  The SARS-CoV-2 RNA is generally detectable in upper and lower respiratory specimens during the acute phase of infection. The lowest concentration of SARS-CoV-2 viral copies this assay can detect is 250 copies / mL. A negative result does not preclude SARS-CoV-2 infection and should not be used as the sole basis for treatment or other patient management decisions.  A negative result may occur with improper specimen collection / handling, submission of specimen other than nasopharyngeal swab, presence of viral mutation(s) within the areas targeted by this assay, and inadequate number of viral copies (<250 copies / mL). A negative result must be combined with clinical observations, patient history, and epidemiological information.  Fact Sheet for Patients:   https://www.patel.info/  Fact Sheet for Healthcare Providers: https://hall.com/  This test is not yet approved or  cleared by the [place] FDA and has been authorized for detection and/or diagnosis of SARS-CoV-2 by FDA under an Emergency Use Authorization (EUA).  This EUA will remain in effect (meaning this test can be used) for the duration of the COVID-19 declaration under Section 564(b)(1) of the Act, 21 U.S.C. section 360bbb-3(b)(1), unless the authorization is terminated or revoked sooner.  Performed at [facility], [address], [place], [place] [address]          Radiology Studies: [facility] Chest [place] 1 View  Result Date: 06/03/2022 CLINICAL DATA:  Chest pain, recent surgery. EXAM: PORTABLE CHEST 1 VIEW COMPARISON:  Chest x-ray dated 05/29/2022 FINDINGS: Heart size and mediastinal contours appear stable, incompletely imaged. Lungs are clear. LEFT costophrenic angle is incompletely imaged. No pleural effusion or pneumothorax is  seen. LEFT chest wall pacemaker/ICD apparatus appears stable, lowest lead incompletely imaged. Interval surgical changes of LEFT humerus fracture fixation. Visualized portion of the hardware appears appropriately positioned. Expected postsurgical changes within the overlying soft tissues. IMPRESSION: 1. No active disease. No evidence of pneumonia or pulmonary edema. 2. Interval surgical changes of LEFT humerus fracture fixation. Visualized portion of the hardware appears appropriately positioned. Electronically Signed   By: Franki Cabot M.D.   On: 06/03/2022 13:46        Scheduled Meds:  sodium chloride   Intravenous Once   allopurinol  150 mg Oral Daily   amiodarone  200 mg Oral  Daily   busPIRone  10 mg Oral BID   cyanocobalamin  1,000 mcg Oral Daily   docusate sodium  100 mg Oral BID   DULoxetine  30 mg Oral Daily   enoxaparin (LOVENOX) injection  30 mg Subcutaneous Q24H   gabapentin  300 mg Oral TID   insulin aspart  0-15 Units Subcutaneous TID WC   insulin glargine-yfgn  16 Units Subcutaneous Daily   levothyroxine  50 mcg Oral Q0600   metoprolol tartrate  12.5 mg Oral BID   pantoprazole  40 mg Oral BID   rOPINIRole  0.25 mg Oral QHS   sodium chloride flush  3 mL Intravenous Q12H   Continuous Infusions:  sodium chloride Stopped (06/04/22 1408)     LOS: 6 days     Sidney Ace, MD [facility]   If 7PM-7AM, please contact night-coverage  06/05/2022, 11:42 AM

## 2022-06-05 NOTE — Plan of Care (Signed)
  Problem: Metabolic: Goal: Ability to maintain appropriate glucose levels will improve Outcome: Progressing   Problem: Nutritional: Goal: Maintenance of adequate nutrition will improve Outcome: Progressing Goal: Progress toward achieving an optimal weight will improve Outcome: Progressing   Problem: Skin Integrity: Goal: Risk for impaired skin integrity will decrease Outcome: Progressing   Problem: Education: Goal: Knowledge of General Education information will improve Description: Including pain rating scale, medication(s)/side effects and non-pharmacologic comfort measures Outcome: Progressing   Problem: Education: Goal: Ability to describe self-care measures that may prevent or decrease complications (Diabetes Survival Skills Education) will improve Outcome: Progressing Goal: Individualized Educational Video(s) Outcome: Progressing

## 2022-06-06 DIAGNOSIS — S42202S Unspecified fracture of upper end of left humerus, sequela: Secondary | ICD-10-CM | POA: Diagnosis not present

## 2022-06-06 LAB — CBC WITH DIFFERENTIAL/PLATELET
Abs Immature Granulocytes: 0.11 10*3/uL — ABNORMAL HIGH (ref 0.00–0.07)
Basophils Absolute: 0 10*3/uL (ref 0.0–0.1)
Basophils Relative: 1 %
Eosinophils Absolute: 0.1 10*3/uL (ref 0.0–0.5)
Eosinophils Relative: 2 %
HCT: 30.8 % — ABNORMAL LOW (ref 36.0–46.0)
Hemoglobin: 9.8 g/dL — ABNORMAL LOW (ref 12.0–15.0)
Immature Granulocytes: 2 %
Lymphocytes Relative: 33 %
Lymphs Abs: 1.5 10*3/uL (ref 0.7–4.0)
MCH: 30.9 pg (ref 26.0–34.0)
MCHC: 31.8 g/dL (ref 30.0–36.0)
MCV: 97.2 fL (ref 80.0–100.0)
Monocytes Absolute: 0.5 10*3/uL (ref 0.1–1.0)
Monocytes Relative: 12 %
Neutro Abs: 2.3 10*3/uL (ref 1.7–7.7)
Neutrophils Relative %: 50 %
Platelets: 217 10*3/uL (ref 150–400)
RBC: 3.17 MIL/uL — ABNORMAL LOW (ref 3.87–5.11)
RDW: 13.6 % (ref 11.5–15.5)
WBC: 4.6 10*3/uL (ref 4.0–10.5)
nRBC: 1.3 % — ABNORMAL HIGH (ref 0.0–0.2)

## 2022-06-06 LAB — GLUCOSE, CAPILLARY
Glucose-Capillary: 112 mg/dL — ABNORMAL HIGH (ref 70–99)
Glucose-Capillary: 219 mg/dL — ABNORMAL HIGH (ref 70–99)
Glucose-Capillary: 166 mg/dL — ABNORMAL HIGH (ref 70–99)
Glucose-Capillary: 243 mg/dL — ABNORMAL HIGH (ref 70–99)

## 2022-06-06 LAB — BASIC METABOLIC PANEL
Anion gap: 6 (ref 5–15)
BUN: 49 mg/dL — ABNORMAL HIGH (ref 8–23)
CO2: 23 mmol/L (ref 22–32)
Calcium: 8.3 mg/dL — ABNORMAL LOW (ref 8.9–10.3)
Chloride: 107 mmol/L (ref 98–111)
Creatinine, Ser: 1.42 mg/dL — ABNORMAL HIGH (ref 0.44–1.00)
GFR, Estimated: 37 mL/min — ABNORMAL LOW (ref >60)
Glucose, Bld: 115 mg/dL — ABNORMAL HIGH (ref 70–99)
Potassium: 4.8 mmol/L (ref 3.5–5.1)
Sodium: 136 mmol/L (ref 135–145)

## 2022-06-06 MED ORDER — APIXABAN 2.5 MG PO TABS
2.5000 mg | ORAL_TABLET | Freq: Two times a day (BID) | ORAL | Status: DC
  Administered 2022-06-06 – 2022-06-08 (×5): 2.5 mg via ORAL
  Filled 2022-06-06 (×5): qty 1

## 2022-06-06 NOTE — Progress Notes (Signed)
Physical Therapy Treatment Patient Details Name: Deborah Obrien MRN: [id number] DOB: 11-May-1941 Today's Date: 06/06/2022   History of Present Illness Pt is an 81 y.o. female presenting to hospital 9/17 s/p fall (tripped and fell over her purse while visiting her husband at his nursing facility); hit L arm and c/o severe pain in middle of L UE that moves up into her shoulder; c/o some pain in R knee; also striking her head.  H/o recent falls.  Imaging showing comminuted and angulated fx of the midshaft of the L humerus (spiral displaced fx L humeral shaft).  Pt now s/p L humeral IM nail on 06/02/22.  PMH includes htn, DM, CHF, a-fib on Eliquis, CKD, anemia, GERD, gout, pacemaker, stroke, and chronic pain syndrome.    PT Comments    Pt presents to PT in bed and agreeable to participate in therapy services. Pt was pleasant and motivated to participate during the session and put forth good effort throughout. Pt able to perform all aspects of functional mobility with no physical assistance required and no LOB. Did require support of LUE for pain control. Amb w step to gait pattern w slow, effortful steps. Tx stopped secondary to pt needing to have a BM. Would benefit from skilled PT at SNF to address above deficits in strength, ROM, functional mobility, and activity tolerance to promote optimal return to PLOF.    Recommendations for follow up therapy are one component of a multi-disciplinary discharge planning process, led by the attending physician.  Recommendations may be updated based on patient status, additional functional criteria and insurance authorization.  Follow Up Recommendations  Skilled nursing-short term rehab (<3 hours/day) Can patient physically be transported by private vehicle: Yes   Assistance Recommended at Discharge Frequent or constant Supervision/Assistance  Patient can return home with the following A little help with walking and/or transfers;A lot of help with  bathing/dressing/bathroom;Assistance with cooking/housework;Assist for transportation;Help with stairs or ramp for entrance   Equipment Recommendations  Other (comment) (TBD at next venue of care)    Recommendations for Other Services       Precautions / Restrictions Precautions Precautions: Fall Precaution Comments: Aspiration Required Braces or Orthoses: Sling Splint/Cast: LUE sling in place Restrictions Weight Bearing Restrictions: Yes LUE Weight Bearing: Non weight bearing     Mobility  Bed Mobility Overal bed mobility: Needs Assistance       Supine to sit: Supervision     General bed mobility comments: Min A to support LUE during bed mobility tasks    Transfers Overall transfer level: Needs assistance Equipment used: Hemi-walker, Transfers: Sit to/from Stand Sit to Stand: Min guard           General transfer comment: Min extra time and effort but no physical assistance required, min cueing for sequencing w hemiwalker    Ambulation/Gait Ambulation/Gait assistance: Min guard Gait Distance (Feet): 10 Feet Assistive device: Hemi-walker, chair follow Gait Pattern/deviations: Step-to pattern, Trunk flexed Gait velocity: decreased     General Gait Details: Slow cadence but steady with no LOB, min cueing for sequencing w hemi walker   Stairs             Wheelchair Mobility    Modified Rankin (Stroke Patients Only)       Balance Overall balance assessment: Needs assistance Sitting-balance support: Single extremity supported, Feet supported Sitting balance-Leahy Scale: Good Sitting balance - Comments: steady static sitting balance   Standing balance support: Single extremity supported, During functional activity Standing balance-Leahy Scale: [place] Standing  balance comment: used hemi walker during amb. required support of LUE throughout for pain control, not balance support                            Cognition Arousal/Alertness:  Awake/alert Behavior During Therapy: WFL for tasks assessed/performed Overall Cognitive Status: Within Functional Limits for tasks assessed                                          Exercises Total Joint Exercises Long Arc Quad: Strengthening, 10 reps, Both General Exercises - Lower Extremity Toe Raises: AROM, Both, 10 reps Heel Raises: AROM, Both, 10 reps    General Comments        Pertinent Vitals/Pain Pain Assessment Pain Assessment: 0-10 Pain Score: 6  Pain Location: L UE/shoulder Pain Descriptors / Indicators: Aching, Sore, Grimacing Pain Intervention(s): Limited activity within patient's tolerance, Monitored during session, RN gave pain meds during session    Home Living                          Prior Function            PT Goals (current goals can now be found in the care plan section) Acute Rehab PT Goals Patient Stated Goal: to improve pain and mobility PT Goal Formulation: With patient Time For Goal Achievement: 06/13/22 Potential to Achieve Goals: Fair Progress towards PT goals: Progressing toward goals    Frequency    7X/week      PT Plan Current plan remains appropriate    Co-evaluation              AM-PAC PT "6 Clicks" Mobility   Outcome Measure  Help needed turning from your back to your side while in a flat bed without using bedrails?: A Lot Help needed moving from lying on your back to sitting on the side of a flat bed without using bedrails?: A Little Help needed moving to and from a bed to a chair (including a wheelchair)?: A Little Help needed standing up from a chair using your arms (e.g., wheelchair or bedside chair)?: A Little Help needed to walk in hospital room?: A Little Help needed climbing 3-5 steps with a railing? : A Lot 6 Click Score: 16    End of Session Equipment Utilized During Treatment: Gait belt Activity Tolerance: Patient tolerated treatment well Patient left: Other (comment) (Pt  left on [facility] w call bell in reach, nursing notified) Nurse Communication: Other (comment) (Pt left on [facility], call bell in reach) PT Visit Diagnosis: Other abnormalities of gait and mobility (R26.89);Muscle weakness (generalized) (M62.81);History of falling (Z91.81);Pain Pain - Right/Left: Left Pain - part of body: Arm     Time: 1610-9604 PT Time Calculation (min) (ACUTE ONLY): 37 min  Charges:                       Glenice Laine MPH, SPT 06/06/22, 10:42 AM

## 2022-06-06 NOTE — Progress Notes (Signed)
PROGRESS NOTE    Deborah Obrien  YTK:[id number] DOB: 21-Nov-1940 DOA: 05/29/2022 PCP: [name], MD    Brief Narrative:  81 year old female who lives alone with past medical history of atrial fibrillation on Eliquis, GERD, anxiety, anemia, CKD, diabetes mellitus, hypertension, hypothyroidism, obesity presented after a fall and found to have a left humerus fracture.  Orthopedics intially recommended conservative management.  Patient has been a significant amount of pain that has been difficult to get under control.  She does have initial relief to IV narcotics however becomes lethargic and confused after.  Reach out again to Dr. Roland Rack per the patient and son's request.  He indicated he would take patient to the operating room on Thursday 9/21.  Patient is status post ORIF left humerus on 9/21.  Got called from PACU as patient apparently had some rapidfire pacer spikes.  Cardiology consulted.  Pacer interrogated.  Functioning appropriately.  No concern regarding dislodgment of pacer leads.  Postoperative course been complicated by significant edema and ecchymosis of left upper extremity.  This is gradually improving.  Seen by orthopedics in followup.    Assessment & Plan:   Principal Problem:   Humerus fracture Active Problems:   Permanent atrial fibrillation (HCC)   Restless leg syndrome   Drop in hemoglobin   Gastro-esophageal reflux disease without esophagitis   Dizziness   CAD (coronary artery disease)   Hypothyroidism   Acute kidney injury superimposed on chronic kidney disease (HCC)   Elevated liver function tests   History of gout  * Humerus fracture Patient was in a lot of pain requiring IV medication and oral medication to control pain.  Orthopedics reconsulted Status post left humerus ORIF on 9/21 Tolerated procedure well Postoperative pain moderate Significant ecchymoses and edema on surgical site, slowly improving Plan: Multimodal pain  control Nonweightbearing left upper extremity Up with therapy is much as tolerated Restart Eliquis today  Restless leg syndrome PTA gabapentin and Requip   Permanent atrial fibrillation ([place]) Called by PACU regarding intermittent pacer spikes noted on telemetry.  Twelve-lead ECG reveals patient converted to atrial fibrillation.  Remained stable and asymptomatic.  No palpitations.  Evaluated by cardiology.  No concern for pacer lead dislodgment at this time.  We will continue telemetry monitoring.  Continue amiodarone.  Continue metoprolol added 9/21.  Eliquis restarted 9/25   Drop in hemoglobin Hemoglobin went from 11.4 down to 8.7.  Hemoglobin has been stable.  No signs of acute blood loss.  I will restart Eliquis 9/25.  Check a.m. CBC.  If stable anticipate medical readiness for discharge 9/26.    Gastro-esophageal reflux disease without esophagitis Oral PPI   Dizziness Continue to monitor.     CAD (coronary artery disease) Stable.  Restarted Eliquis.  Holding pravastatin with slightly elevated liver function test.  Can restart on DC   Hypothyroidism Continue levothyroxine   Acute kidney injury superimposed on chronic kidney disease stage IIIa (HCC) Acute kidney injury on CKD stage IIIa.  Creatinine 1.72 on presentation baseline 1.4. Creat variable.  Monitor labs.  Avoid nonessential nephrotoxins.   History of gout Continue allopurinol   Elevated liver function tests Continue holding statin   DVT prophylaxis: SCD Code Status: Full Family Communication: Son at bedside 9/20, via phone 9/22, left VM on 9/24 Disposition Plan: Status is: Inpatient Remains inpatient appropriate because: Humerus fracture.  Intractable pain.  Postoperative fixation 9/21.  Anticipate medical readiness for discharge 9/26  Level of care: Med-Surg  Consultants:  Orthopedics  Procedures:  None  Antimicrobials: None   Subjective: Seen and examined.  Sitting up in chair.  Pain mild to  moderate  Objective: Vitals:   06/04/22 1752 06/05/22 0817 06/05/22 1521 06/06/22 0722  BP: (!) 97/53 (!) 122/54 (!) 132/54 (!) 122/46  Pulse: (!) 58 (!) 57 (!) 59 (!) 59  Resp:  17 18   Temp: 98.2 F (36.8 C) 98.1 F (36.7 C) 98.1 F (36.7 C) 98.5 F (36.9 C)  TempSrc:      SpO2: 95% 97% 100% 100%  Weight:      Height:        Intake/Output Summary (Last 24 hours) at 06/06/2022 1108 Last data filed at 06/06/2022 1000 Gross per 24 hour  Intake 720 ml  Output 950 ml  Net -230 ml   Filed Weights   05/29/22 1456 05/30/22 0204  Weight: 77.1 kg 78.8 kg    Examination:  General exam: No acute distress.  Appears fatigued Respiratory system: Clear to auscultation. Respiratory effort normal. Cardiovascular system: S1-S2, RRR, no murmurs, no pedal edema Gastrointestinal system: Soft, NT/ND, normal bowel sounds Central nervous system: Alert and oriented. No focal neurological deficits. Extremities: Left arm in sling.  Significant ecchymosis around surgical site with swelling, improving over interval Skin: Bruise on forehead Psychiatry: Judgement and insight appear normal. Mood & affect confused.     Data Reviewed: I have personally reviewed following labs and imaging studies  CBC: Recent Labs  Lab 06/02/22 1002 06/02/22 1707 06/03/22 0140 06/04/22 0240 06/04/22 0840 06/04/22 1419 06/04/22 1925 06/05/22 0419 06/05/22 0731 06/06/22 0431  WBC 3.6* 4.3  --  6.1  --   --   --  5.4  --  4.6  NEUTROABS 2.4 3.2  --   --   --   --   --   --   --  2.3  HGB 8.7* 8.2*   < > 10.1*   < > 8.9* 8.7* 10.3* 8.9* 9.8*  HCT 28.4* 26.3*   < > 32.2*   < > 27.6* 26.9* 32.1* 27.3* 30.8*  MCV 101.8* 100.4*  --  99.7  --   --   --  97.3  --  97.2  PLT 152 149*  --  163  --   --   --  197  --  217   < > = values in this interval not displayed.   Basic Metabolic Panel: Recent Labs  Lab 06/02/22 1002 06/02/22 1707 06/03/22 0944 06/04/22 0840 06/06/22 0431  NA 134* 135 137 134* 136  K  5.3* 4.8 5.7* 5.0 4.8  CL 103 102 106 105 107  CO2 25 22 16* 19* 23  GLUCOSE 277* 244* 287* 206* 115*  BUN 59* 55* 53* 61* 49*  CREATININE 2.04* 1.89* 1.83* 2.23* 1.42*  CALCIUM 8.4* 8.4* 7.9* 7.9* 8.3*   GFR: Estimated Creatinine Clearance: 30.9 mL/min (A) (by C-G formula based on SCr of 1.42 mg/dL (H)). Liver Function Tests: Recent Labs  Lab 06/01/22 0630  AST 46*  ALT 41  ALKPHOS 213*  BILITOT 1.0  PROT 5.8*  ALBUMIN 2.8*   No results for input(s): "LIPASE", "AMYLASE" in the last 168 hours. No results for input(s): "AMMONIA" in the last 168 hours. Coagulation Profile: Recent Labs  Lab 06/02/22 1002  INR 1.0   Cardiac Enzymes: No results for input(s): "CKTOTAL", "CKMB", "CKMBINDEX", "TROPONINI" in the last 168 hours. BNP (last 3 results) No results for input(s): "PROBNP" in the last 8760 hours. HbA1C: No results for input(s): "  HGBA1C" in the last 72 hours.  CBG: Recent Labs  Lab 06/05/22 0803 06/05/22 1127 06/05/22 1647 06/05/22 1946 06/06/22 0756  GLUCAP 126* 160* 212* 157* 112*   Lipid Profile: No results for input(s): "CHOL", "HDL", "LDLCALC", "TRIG", "CHOLHDL", "LDLDIRECT" in the last 72 hours. Thyroid Function Tests: No results for input(s): "TSH", "T4TOTAL", "FREET4", "T3FREE", "THYROIDAB" in the last 72 hours. Anemia Panel: No results for input(s): "VITAMINB12", "FOLATE", "FERRITIN", "TIBC", "IRON", "RETICCTPCT" in the last 72 hours.  Sepsis Labs: No results for input(s): "PROCALCITON", "LATICACIDVEN" in the last 168 hours.  Recent Results (from the past 240 hour(s))  SARS Coronavirus 2 by RT PCR (hospital order, performed in [facility] hospital lab) *cepheid single result test* Anterior Nasal Swab     Status: None   Collection Time: 06/04/22  2:00 PM   Specimen: Anterior Nasal Swab  Result Value Ref Range Status   SARS Coronavirus 2 by RT PCR NEGATIVE NEGATIVE Final    Comment: (NOTE) SARS-CoV-2 target nucleic acids are NOT DETECTED.  The  SARS-CoV-2 RNA is generally detectable in upper and lower respiratory specimens during the acute phase of infection. The lowest concentration of SARS-CoV-2 viral copies this assay can detect is 250 copies / mL. A negative result does not preclude SARS-CoV-2 infection and should not be used as the sole basis for treatment or other patient management decisions.  A negative result may occur with improper specimen collection / handling, submission of specimen other than nasopharyngeal swab, presence of viral mutation(s) within the areas targeted by this assay, and inadequate number of viral copies (<250 copies / mL). A negative result must be combined with clinical observations, patient history, and epidemiological information.  Fact Sheet for Patients:   https://www.patel.info/  Fact Sheet for Healthcare Providers: https://hall.com/  This test is not yet approved or  cleared by the [place] FDA and has been authorized for detection and/or diagnosis of SARS-CoV-2 by FDA under an Emergency Use Authorization (EUA).  This EUA will remain in effect (meaning this test can be used) for the duration of the COVID-19 declaration under Section 564(b)(1) of the Act, 21 U.S.C. section 360bbb-3(b)(1), unless the authorization is terminated or revoked sooner.  Performed at [facility], [address], [place], [place] [address]          Radiology Studies: No results found.      Scheduled Meds:  sodium chloride   Intravenous Once   allopurinol  150 mg Oral Daily   amiodarone  200 mg Oral Daily   apixaban  2.5 mg Oral BID   busPIRone  10 mg Oral BID   cyanocobalamin  1,000 mcg Oral Daily   DULoxetine  30 mg Oral Daily   gabapentin  300 mg Oral TID   insulin aspart  0-15 Units Subcutaneous TID WC   insulin glargine-yfgn  16 Units Subcutaneous Daily   levothyroxine  50 mcg Oral Q0600   metoprolol tartrate  12.5 mg Oral BID    pantoprazole  40 mg Oral BID   rOPINIRole  0.25 mg Oral QHS   sodium chloride flush  3 mL Intravenous Q12H   Continuous Infusions:  sodium chloride Stopped (06/04/22 1408)     LOS: 7 days     Sidney Ace, MD [facility]   If 7PM-7AM, please contact night-coverage  06/06/2022, 11:08 AM

## 2022-06-06 NOTE — Plan of Care (Signed)
  Problem: Education: Goal: Knowledge of General Education information will improve Description: Including pain rating scale, medication(s)/side effects and non-pharmacologic comfort measures Outcome: Progressing   Problem: Health Behavior/Discharge Planning: Goal: Ability to manage health-related needs will improve Outcome: Progressing   Problem: Clinical Measurements: Goal: Diagnostic test results will improve Outcome: Progressing Goal: Respiratory complications will improve Outcome: Progressing Goal: Cardiovascular complication will be avoided Outcome: Progressing   Problem: Activity: Goal: Risk for activity intolerance will decrease Outcome: Progressing   Problem: Nutrition: Goal: Adequate nutrition will be maintained Outcome: Progressing   Problem: Elimination: Goal: Will not experience complications related to bowel motility Outcome: Progressing Goal: Will not experience complications related to urinary retention Outcome: Progressing   Problem: Pain Managment: Goal: General experience of comfort will improve Outcome: Progressing   

## 2022-06-06 NOTE — Progress Notes (Signed)
Subjective: 4 Days Post-Op Procedure(s) (LRB): INTRAMEDULLARY (IM) NAIL HUMERAL (Left) Patient reports pain as moderate in the left shoulder. Swelling slowly improving.  Plan is to go Skilled nursing facility after hospital stay. Negative for chest pain and shortness of breath Fever: no Gastrointestinal:Negative for nausea and vomiting this morning.  Objective: Vital signs in last 24 hours: Temp:  [98.5 F (36.9 C)] 98.5 F (36.9 C) (09/25 0722) Pulse Rate:  [59] 59 (09/25 0722) BP: (122)/(46) 122/46 (09/25 0722) SpO2:  [100 %] 100 % (09/25 0722)  Intake/Output from previous day:  Intake/Output Summary (Last 24 hours) at 06/06/2022 1608 Last data filed at 06/06/2022 1230 Gross per 24 hour  Intake 720 ml  Output 1050 ml  Net -330 ml    Intake/Output this shift: Total I/O In: 480 [P.O.:480] Out: 650 [Urine:650]  Labs: Recent Labs    06/04/22 1419 06/04/22 1925 06/05/22 0419 06/05/22 0731 06/06/22 0431  HGB 8.9* 8.7* 10.3* 8.9* 9.8*   Recent Labs    06/05/22 0419 06/05/22 0731 06/06/22 0431  WBC 5.4  --  4.6  RBC 3.30*  --  3.17*  HCT 32.1* 27.3* 30.8*  PLT 197  --  217   Recent Labs    06/04/22 0840 06/06/22 0431  NA 134* 136  K 5.0 4.8  CL 105 107  CO2 19* 23  BUN 61* 49*  CREATININE 2.23* 1.42*  GLUCOSE 206* 115*  CALCIUM 7.9* 8.3*   No results for input(s): "LABPT", "INR" in the last 72 hours.    EXAM General - Patient is Alert and Appropriate Extremity - Patient is wearing a sling to the left arm this morning.  Polar Care intact with cooler with ice.  Patient with polar care just laying on the arm, not strapped around it. Swelling and ecchymosis present.  Compartments are soft throughout the left upper extremity.  Swelling and ecchymosis are both improving. Incision site is intact, honeycomb dressing clean dry and intact  Both the upper and lower portions of the arm are soft to palpation.  ABle to gentle flex and extend the elbow as tolerated  today. Radial pulse intact.  Unable to make a full fist but able to move digits throughout the left upper extremity No evidence for compartment syndrome  Past Medical History:  Diagnosis Date   A-fib ([place]) 2016   Acid reflux    Acute respiratory failure () 04/09/2018   Anemia    Anxiety    Atrial flutter ([place]) 08/24/2017   Bowel obstruction (HCC)    Carotid artery disease (HCC)    Chronic kidney disease (CKD) stage G3a/A1, moderately decreased glomerular filtration rate (GFR) between 45-59 mL/min/1.73 square meter and albuminuria creatinine ratio less than 30 mg/g (HCC)    stage IV   Closed fracture of one rib of right side 05/29/2020   Complete tear of left rotator cuff 10/16/2014   Coronary artery disease    Depression    Diabetes mellitus without complication ([place])    Diarrhea 03/04/2022   Dyspnea    Dysrhythmia    PAROXYSMAL ATRIAL FIBRILLATION   Fatty liver    Gastritis without bleeding    GI bleed    Gout    Hypertension    Hypothyroidism    Low back pain    history of kyphoplasty t12-l1   Morbid obesity (HCC)    Multiple gastric polyps    history of gi bleed.  polyps removed   Neuromuscular disorder ([place])    diabetic neuropathy  Osteoarthritis    Osteoporosis    Pacemaker 04/09/2018   MDT- upgraded to duel chamber device Dec 2018- lead revision post upgrade dec 2018   Paroxysmal atrial fibrillation ([place])    Presence of permanent cardiac pacemaker    Sinus node dysfunction ([place]) 08/24/2017   Stroke ([place])    TIA, patient unaware that she had it. no residual   Tendinitis of upper biceps tendon of left shoulder 11/10/2019   TIA (transient ischemic attack)    Type 2 diabetes mellitus with neurological complications ([place]) 8/76/8115   Uncontrolled type 2 diabetes mellitus with hypoglycemia, with long-term current use of insulin ([place]) 03/04/2022    Assessment/Plan: 4 Days Post-Op Procedure(s) (LRB): INTRAMEDULLARY (IM) NAIL HUMERAL (Left) Principal Problem:    Humerus fracture Active Problems:   Permanent atrial fibrillation (HCC)   Gastro-esophageal reflux disease without esophagitis   Dizziness   CAD (coronary artery disease)   Hypothyroidism   Acute kidney injury superimposed on chronic kidney disease (HCC)   Drop in hemoglobin   Elevated liver function tests   History of gout   Restless leg syndrome  Estimated body mass index is 30.77 kg/m as calculated from the following:   Height as of this encounter: 5\' 3"  (1.6 m).   Weight as of this encounter: 78.8 kg. Advance diet Up with therapy  Vital signs stable.  Hg 9.8 this morning. Pain controlled. Continue with sling/Polar Care unit left upper extremity. Patient with several bowel movements, discontinued our stool softeners this morning. Care manager to assist with discharge to skilled nursing facility.  Upon discharge from hospital, follow-up with [place] in 10-14 days for staple removal and x-rays of the left humerus.  DVT Prophylaxis - Lovenox and SCDs Non-weightbearing to the left arm.  Raquel Marlean Mortell, PA-C [facility] Orthopaedic Surgery 06/06/2022, 4:08 PM

## 2022-06-06 NOTE — TOC Progression Note (Signed)
Transition of Care [facility]) - Progression Note    Patient Details  Name: ANBER MCKIVER MRN: [id number] Date of Birth: 04-14-1941  Transition of [facility]) CM/SW [place], RN Phone Number: 06/06/2022, 9:08 AM  Clinical Narrative:    [place] web page and the Ins Josem Kaufmann is pending to go to [employer] [facility]                                                 Social Determinants of Health (SDOH) Interventions    Readmission Risk Interventions     No data to display

## 2022-06-06 NOTE — Progress Notes (Signed)
Occupational Therapy Treatment Patient Details Name: Deborah Obrien MRN: [id number] DOB: Jan 26, 1941 Today's Date: 06/06/2022   History of present illness Pt is an 81 y.o. female presenting to hospital 9/17 s/p fall (tripped and fell over her purse while visiting her husband at his nursing facility); hit L arm and c/o severe pain in middle of L UE that moves up into her shoulder; c/o some pain in R knee; also striking her head.  H/o recent falls.  Imaging showing comminuted and angulated fx of the midshaft of the L humerus (spiral displaced fx L humeral shaft).  Pt now s/p L humeral IM nail on 06/02/22.  PMH includes htn, DM, CHF, a-fib on Eliquis, CKD, anemia, GERD, gout, pacemaker, stroke, and chronic pain syndrome.   OT comments  Upon arrival, Pt reports having just gotten cleaned up, and transferring back to the recliner after having had an episode of Diarrhea. Pt. Reports 9-10/10 left shoulder pain. Pt. Was tearful after talking with her husband on the telephone, reporting that it was their 63rd wedding anniversary this past weekend. Therapeutic listening was provided. Pt. was assisted with repositioning. The sling, and swath was readjusted for support, and a pillow was repositioned for additional support. Pt reported decreased pain following the position changes. Nursing was notified about Pt.'s report of pain. Pt. Continues to benefit from OT services for ADL training, A/E training, positioning and pt. Education about home modification, and DME.  Pt. would benefit from SNF level of care upon discharge, with follow-up OT services.    Recommendations for follow up therapy are one component of a multi-disciplinary discharge planning process, led by the attending physician.  Recommendations may be updated based on patient status, additional functional criteria and insurance authorization.    Follow Up Recommendations  Skilled nursing-short term rehab (<3 hours/day)    Assistance Recommended at  Discharge Frequent or constant Supervision/Assistance  Patient can return home with the following  A lot of help with bathing/dressing/bathroom;Assistance with cooking/housework;Direct supervision/assist for medications management;Assist for transportation;A lot of help with walking and/or transfers;Assistance with feeding   Equipment Recommendations       Recommendations for Other Services      Precautions / Restrictions Precautions Required Braces or Orthoses: Sling Splint/Cast: LUE sling in place Restrictions Weight Bearing Restrictions: Yes LUE Weight Bearing: Non weight bearing       Mobility Bed Mobility               General bed mobility comments: Pt. sitting up in the recliner chair upon arrival.    Transfers                         Balance                                           ADL either performed or assessed with clinical judgement   ADL                   Upper Body Dressing : Maximal assistance   Lower Body Dressing: Maximal assistance                      Extremity/Trunk Assessment Upper Extremity Assessment Upper Extremity Assessment: LUE deficits/detail LUE Deficits / Details: Pt with humerus fracture now status post ORIF of left humerus, NWB.  Vision Baseline Vision/History: 1 Wears glasses     Perception     Praxis      Cognition Arousal/Alertness: Awake/alert Behavior During Therapy: WFL for tasks assessed/performed Overall Cognitive Status: Within Functional Limits for tasks assessed                                          Exercises      Shoulder Instructions       General Comments      Pertinent Vitals/ Pain       Pain Assessment Pain Assessment: 0-10 Pain Score: 9   Home Living                                          Prior Functioning/Environment              Frequency  Min 2X/week        Progress Toward  Goals  OT Goals(current goals can now be found in the care plan section)  Progress towards OT goals: Progressing toward goals  Acute Rehab OT Goals Patient Stated Goal: To regain independence OT Goal Formulation: With patient Time For Goal Achievement: 06/18/22 Potential to Achieve Goals: Good  Plan Discharge plan remains appropriate    Co-evaluation                 AM-PAC OT "6 Clicks" Daily Activity     Outcome Measure   Help from another person eating meals?: A Little Help from another person taking care of personal grooming?: A Little Help from another person toileting, which includes using toliet, bedpan, or urinal?: A Lot Help from another person bathing (including washing, rinsing, drying)?: A Lot Help from another person to put on and taking off regular upper body clothing?: A Lot Help from another person to put on and taking off regular lower body clothing?: A Lot 6 Click Score: 14    End of Session    OT Visit Diagnosis: Muscle weakness (generalized) (M62.81);Pain Pain - Right/Left: Left Pain - part of body: Shoulder   Activity Tolerance Patient limited by pain   Patient Left in bed;in chair;with chair alarm set;with call bell/phone within reach   Nurse Communication          Time: 9629-5284 OT Time Calculation (min): 23 min  Charges: OT General Charges $OT Visit: 1 Visit OT Treatments $Self Care/Home Management : 23-37 mins  Harrel Carina, MS, OTR/L  Harrel Carina 06/06/2022, 4:20 PM

## 2022-06-06 NOTE — Discharge Instructions (Signed)
Diet: As you were doing prior to hospitalization   Shower:  May shower but keep the wounds dry, use an occlusive plastic wrap, NO SOAKING IN TUB.  If the bandage gets wet, change with a clean dry gauze.  Dressing:  You may change your dressing as needed. Change the dressing with sterile gauze dressing.    Activity:  Increase activity slowly as tolerated, but follow the weight bearing instructions below.  No lifting or driving for 6 weeks.  Weight Bearing:   Non-weightbearing to the left arm at this time.  To prevent constipation: you may use a stool softener such as -  Colace (over the counter) 100 mg by mouth twice a day  Drink plenty of fluids (prune juice may be helpful) and high fiber foods Miralax (over the counter) for constipation as needed.    Itching:  If you experience itching with your medications, try taking only a single pain pill, or even half a pain pill at a time.  You may take up to 10 pain pills per day, and you can also use benadryl over the counter for itching or also to help with sleep.   Precautions:  If you experience chest pain or shortness of breath - call 911 immediately for transfer to the hospital emergency department!!  If you develop a fever greater that 101 F, purulent drainage from wound, increased redness or drainage from wound, or calf pain-Call [place]                                              Follow- Up Appointment:  Please call for an appointment to be seen in 2 weeks at [facility]

## 2022-06-06 NOTE — Care Management Important Message (Signed)
Important Message  Patient Details  Name: Deborah Obrien MRN: [id number] Date of Birth: Aug 23, 1941   Medicare Important Message Given:  Yes     Dannette Barbara 06/06/2022, 11:48 AM

## 2022-06-06 NOTE — Plan of Care (Signed)
  Problem: Health Behavior/Discharge Planning: Goal: Ability to identify and utilize available resources and services will improve Outcome: Progressing   Problem: Nutritional: Goal: Maintenance of adequate nutrition will improve Outcome: Progressing   Problem: Skin Integrity: Goal: Risk for impaired skin integrity will decrease Outcome: Progressing   Problem: Tissue Perfusion: Goal: Adequacy of tissue perfusion will improve Outcome: Progressing   Problem: Clinical Measurements: Goal: Diagnostic test results will improve Outcome: Progressing   Problem: Clinical Measurements: Goal: Will remain free from infection Outcome: Progressing

## 2022-06-07 ENCOUNTER — Encounter: Payer: Self-pay | Admitting: Surgery

## 2022-06-07 DIAGNOSIS — S42202S Unspecified fracture of upper end of left humerus, sequela: Secondary | ICD-10-CM | POA: Diagnosis not present

## 2022-06-07 LAB — GLUCOSE, CAPILLARY
Glucose-Capillary: 144 mg/dL — ABNORMAL HIGH (ref 70–99)
Glucose-Capillary: 163 mg/dL — ABNORMAL HIGH (ref 70–99)
Glucose-Capillary: 206 mg/dL — ABNORMAL HIGH (ref 70–99)
Glucose-Capillary: 187 mg/dL — ABNORMAL HIGH (ref 70–99)

## 2022-06-07 NOTE — TOC Progression Note (Signed)
Transition of Care [facility]) - Progression Note    Patient Details  Name: Deborah Obrien MRN: [id number] Date of Birth: 03/27/1941  Transition of Care [facility]) CM/SW [place], RN Phone Number: 06/07/2022, 4:32 PM  Clinical Narrative:     Ins approved to go to [facility], will check with leslie tomorrow to see if they have a bed, if not may need to check on another bed offer, Ins is approved from 9/26-9/28  Expected Discharge Plan: [place] Barriers to Discharge: Insurance Authorization  Expected Discharge Plan and Services Expected Discharge Plan: [place]                                               Social Determinants of Health (SDOH) Interventions    Readmission Risk Interventions     No data to display

## 2022-06-07 NOTE — Plan of Care (Signed)
  Problem: Health Behavior/Discharge Planning: Goal: Ability to identify and utilize available resources and services will improve Outcome: Progressing Goal: Ability to manage health-related needs will improve Outcome: Progressing   Problem: Nutritional: Goal: Maintenance of adequate nutrition will improve Outcome: Progressing   Problem: Health Behavior/Discharge Planning: Goal: Ability to manage health-related needs will improve Outcome: Progressing   Problem: Clinical Measurements: Goal: Diagnostic test results will improve Outcome: Progressing Goal: Respiratory complications will improve Outcome: Progressing Goal: Cardiovascular complication will be avoided Outcome: Progressing   Problem: Activity: Goal: Risk for activity intolerance will decrease Outcome: Progressing   Problem: Nutrition: Goal: Adequate nutrition will be maintained Outcome: Progressing

## 2022-06-07 NOTE — Progress Notes (Signed)
PROGRESS NOTE    Deborah Obrien  FBP:[id number] DOB: 08-05-1941 DOA: 05/29/2022 PCP: [name], MD    Brief Narrative:  81 year old female who lives alone with past medical history of atrial fibrillation on Eliquis, GERD, anxiety, anemia, CKD, diabetes mellitus, hypertension, hypothyroidism, obesity presented after a fall and found to have a left humerus fracture.  Orthopedics intially recommended conservative management.  Patient has been a significant amount of pain that has been difficult to get under control.  She does have initial relief to IV narcotics however becomes lethargic and confused after.  Reach out again to Dr. Roland Rack per the patient and son's request.  He indicated he would take patient to the operating room on Thursday 9/21.  Patient is status post ORIF left humerus on 9/21.  Got called from PACU as patient apparently had some rapidfire pacer spikes.  Cardiology consulted.  Pacer interrogated.  Functioning appropriately.  No concern regarding dislodgment of pacer leads.  Postoperative course been complicated by significant edema and ecchymosis of left upper extremity.  This is gradually improving.  Seen by orthopedics in followup.  9/26: Patient steadily improving.  Ecchymosis and edema of left upper extremity is improved.  Pain control improved.    Assessment & Plan:   Principal Problem:   Humerus fracture Active Problems:   Permanent atrial fibrillation (HCC)   Restless leg syndrome   Drop in hemoglobin   Gastro-esophageal reflux disease without esophagitis   Dizziness   CAD (coronary artery disease)   Hypothyroidism   Acute kidney injury superimposed on chronic kidney disease (HCC)   Elevated liver function tests   History of gout  * Humerus fracture On initial orthopedic evaluation decision was made for nonoperative management.  Patient was in a lot of pain requiring IV medication and oral medication to control pain.  Orthopedics  reconsulted Status post left humerus ORIF on 9/21 Tolerated procedure well Postoperative pain moderate Significant ecchymoses and edema on surgical site, slowly improving Plan: Multimodal pain control Nonweightbearing left upper extremity Up with therapy is much as tolerated Continue PTA Eliquis  Restless leg syndrome PTA gabapentin and Requip   Permanent atrial fibrillation ([place]) Called by PACU regarding intermittent pacer spikes noted on telemetry.  Twelve-lead ECG reveals patient converted to atrial fibrillation.  Remained stable and asymptomatic.  No palpitations.  Evaluated by cardiology.  No concern for pacer lead dislodgment at this time.  We will continue telemetry monitoring.  Continue amiodarone.  Continue metoprolol added 9/21.  Eliquis restarted 9/25   Drop in hemoglobin Hemoglobin went from 11.4 down to 8.7.  Hemoglobin has been stable.  No signs of acute blood loss.  Continue Eliquis restarted 9/25.  Monitor hemoglobin.    Gastro-esophageal reflux disease without esophagitis Oral PPI   Dizziness Continue to monitor.     CAD (coronary artery disease) Stable.  Restarted Eliquis.  Holding pravastatin with slightly elevated liver function test.  Can restart on DC   Hypothyroidism Continue levothyroxine   Acute kidney injury superimposed on chronic kidney disease stage IIIa (HCC) Acute kidney injury on CKD stage IIIa.  Creatinine 1.72 on presentation baseline 1.4. Creat variable.  Monitor labs.  Avoid nonessential nephrotoxins.   History of gout Continue allopurinol   Elevated liver function tests Continue holding statin   DVT prophylaxis: Eliquis Code Status: Full Family Communication: Bunnie Philips [phone] at bedside 9/20, via phone 9/22, left VM on 9/24, left VM on 9/26 Disposition Plan: Status is: Inpatient Remains inpatient appropriate because: Unsafe discharge plan.  Patient medically stable for DC.    Level of care: Med-Surg  Consultants:   Orthopedics  Procedures:  None  Antimicrobials: None   Subjective: Seen and examined.  Lying in bed.  In good spirits this morning.  Pain well controlled.  Objective: Vitals:   06/06/22 0722 06/06/22 1648 06/07/22 0538 06/07/22 0816  BP: (!) 122/46 (!) 147/99 (!) 133/54 (!) 142/55  Pulse: (!) 59 62 (!) 59 (!) 59  Resp:   17 16  Temp: 98.5 F (36.9 C) 97.8 F (36.6 C) 97.9 F (36.6 C) 97.7 F (36.5 C)  TempSrc:      SpO2: 100% 100% 100% 98%  Weight:      Height:        Intake/Output Summary (Last 24 hours) at 06/07/2022 1315 Last data filed at 06/07/2022 1205 Gross per 24 hour  Intake 120 ml  Output 1200 ml  Net -1080 ml   Filed Weights   05/29/22 1456 05/30/22 0204  Weight: 77.1 kg 78.8 kg    Examination:  General exam: NAD.  Frail-appearing Respiratory system: Lungs clear.  Normal work of breathing.  Room air Cardiovascular system: S1-S2, RRR, no murmurs, no pedal edema Gastrointestinal system: Soft, NT/ND, normal bowel sounds Central nervous system: Alert and oriented. No focal neurological deficits. Extremities: Left arm in sling.  Significant ecchymosis around surgical site with swelling, improving over interval Skin: Bruise on forehead Psychiatry: Judgement and insight appear normal. Mood & affect confused.     Data Reviewed: I have personally reviewed following labs and imaging studies  CBC: Recent Labs  Lab 06/02/22 1002 06/02/22 1707 06/03/22 0140 06/04/22 0240 06/04/22 0840 06/04/22 1419 06/04/22 1925 06/05/22 0419 06/05/22 0731 06/06/22 0431  WBC 3.6* 4.3  --  6.1  --   --   --  5.4  --  4.6  NEUTROABS 2.4 3.2  --   --   --   --   --   --   --  2.3  HGB 8.7* 8.2*   < > 10.1*   < > 8.9* 8.7* 10.3* 8.9* 9.8*  HCT 28.4* 26.3*   < > 32.2*   < > 27.6* 26.9* 32.1* 27.3* 30.8*  MCV 101.8* 100.4*  --  99.7  --   --   --  97.3  --  97.2  PLT 152 149*  --  163  --   --   --  197  --  217   < > = values in this interval not displayed.    Basic Metabolic Panel: Recent Labs  Lab 06/02/22 1002 06/02/22 1707 06/03/22 0944 06/04/22 0840 06/06/22 0431  NA 134* 135 137 134* 136  K 5.3* 4.8 5.7* 5.0 4.8  CL 103 102 106 105 107  CO2 25 22 16* 19* 23  GLUCOSE 277* 244* 287* 206* 115*  BUN 59* 55* 53* 61* 49*  CREATININE 2.04* 1.89* 1.83* 2.23* 1.42*  CALCIUM 8.4* 8.4* 7.9* 7.9* 8.3*   GFR: Estimated Creatinine Clearance: 30.9 mL/min (A) (by C-G formula based on SCr of 1.42 mg/dL (H)). Liver Function Tests: Recent Labs  Lab 06/01/22 0630  AST 46*  ALT 41  ALKPHOS 213*  BILITOT 1.0  PROT 5.8*  ALBUMIN 2.8*   No results for input(s): "LIPASE", "AMYLASE" in the last 168 hours. No results for input(s): "AMMONIA" in the last 168 hours. Coagulation Profile: Recent Labs  Lab 06/02/22 1002  INR 1.0   Cardiac Enzymes: No results for input(s): "CKTOTAL", "CKMB", "CKMBINDEX", "TROPONINI" in  the last 168 hours. BNP (last 3 results) No results for input(s): "PROBNP" in the last 8760 hours. HbA1C: No results for input(s): "HGBA1C" in the last 72 hours.  CBG: Recent Labs  Lab 06/06/22 1140 06/06/22 1628 06/06/22 2121 06/07/22 0812 06/07/22 1224  GLUCAP 219* 166* 243* 144* 163*   Lipid Profile: No results for input(s): "CHOL", "HDL", "LDLCALC", "TRIG", "CHOLHDL", "LDLDIRECT" in the last 72 hours. Thyroid Function Tests: No results for input(s): "TSH", "T4TOTAL", "FREET4", "T3FREE", "THYROIDAB" in the last 72 hours. Anemia Panel: No results for input(s): "VITAMINB12", "FOLATE", "FERRITIN", "TIBC", "IRON", "RETICCTPCT" in the last 72 hours.  Sepsis Labs: No results for input(s): "PROCALCITON", "LATICACIDVEN" in the last 168 hours.  Recent Results (from the past 240 hour(s))  SARS Coronavirus 2 by RT PCR (hospital order, performed in [facility] hospital lab) *cepheid single result test* Anterior Nasal Swab     Status: None   Collection Time: 06/04/22  2:00 PM   Specimen: Anterior Nasal Swab  Result Value  Ref Range Status   SARS Coronavirus 2 by RT PCR NEGATIVE NEGATIVE Final    Comment: (NOTE) SARS-CoV-2 target nucleic acids are NOT DETECTED.  The SARS-CoV-2 RNA is generally detectable in upper and lower respiratory specimens during the acute phase of infection. The lowest concentration of SARS-CoV-2 viral copies this assay can detect is 250 copies / mL. A negative result does not preclude SARS-CoV-2 infection and should not be used as the sole basis for treatment or other patient management decisions.  A negative result may occur with improper specimen collection / handling, submission of specimen other than nasopharyngeal swab, presence of viral mutation(s) within the areas targeted by this assay, and inadequate number of viral copies (<250 copies / mL). A negative result must be combined with clinical observations, patient history, and epidemiological information.  Fact Sheet for Patients:   https://www.patel.info/  Fact Sheet for Healthcare Providers: https://hall.com/  This test is not yet approved or  cleared by the [place] FDA and has been authorized for detection and/or diagnosis of SARS-CoV-2 by FDA under an Emergency Use Authorization (EUA).  This EUA will remain in effect (meaning this test can be used) for the duration of the COVID-19 declaration under Section 564(b)(1) of the Act, 21 U.S.C. section 360bbb-3(b)(1), unless the authorization is terminated or revoked sooner.  Performed at [facility], [address], [place], [place] [address]          Radiology Studies: No results found.      Scheduled Meds:  sodium chloride   Intravenous Once   allopurinol  150 mg Oral Daily   amiodarone  200 mg Oral Daily   apixaban  2.5 mg Oral BID   busPIRone  10 mg Oral BID   cyanocobalamin  1,000 mcg Oral Daily   DULoxetine  30 mg Oral Daily   gabapentin  300 mg Oral TID   insulin aspart  0-15 Units  Subcutaneous TID WC   insulin glargine-yfgn  16 Units Subcutaneous Daily   levothyroxine  50 mcg Oral Q0600   metoprolol tartrate  12.5 mg Oral BID   pantoprazole  40 mg Oral BID   rOPINIRole  0.25 mg Oral QHS   sodium chloride flush  3 mL Intravenous Q12H   Continuous Infusions:  sodium chloride Stopped (06/04/22 1408)     LOS: 8 days     Sidney Ace, MD [facility]   If 7PM-7AM, please contact night-coverage  06/07/2022, 1:15 PM

## 2022-06-07 NOTE — TOC Progression Note (Signed)
Transition of Care [facility]) - Progression Note    Patient Details  Name: Deborah Obrien MRN: [id number] Date of Birth: [birth year]  Transition of Care [name]) CM/SW [place], RN Phone Number: 06/07/2022, 11:42 AM  Clinical Narrative:     [name] Paganini with [facility] called and said that they do not have a bed today and not sure about tomorrow, I notified her that Insurance is pending, She will let me know when they do get a bed       Expected Discharge Plan and Services                                                 Social Determinants of Health (SDOH) Interventions    Readmission Risk Interventions     No data to display

## 2022-06-07 NOTE — Progress Notes (Signed)
Subjective: 5 Days Post-Op Procedure(s) (LRB): INTRAMEDULLARY (IM) NAIL HUMERAL (Left) Patient reports pain as moderate in the left shoulder. Swelling slowly improving.  Patient reports that her pain is improving to the left shoulder Plan is to go Skilled nursing facility after hospital stay.  Insurance pending at this time. Negative for chest pain and shortness of breath Fever: no Gastrointestinal:Negative for nausea and vomiting this morning. Patient has had several bowel movements since surgery.  Objective: Vital signs in last 24 hours: Temp:  [97.7 F (36.5 C)-97.9 F (36.6 C)] 97.7 F (36.5 C) (09/26 0816) Pulse Rate:  [59-62] 59 (09/26 0816) Resp:  [16-17] 16 (09/26 0816) BP: (133-147)/(54-99) 142/55 (09/26 0816) SpO2:  [98 %-100 %] 98 % (09/26 0816)  Intake/Output from previous day:  Intake/Output Summary (Last 24 hours) at 06/07/2022 1521 Last data filed at 06/07/2022 1205 Gross per 24 hour  Intake 120 ml  Output 1200 ml  Net -1080 ml    Intake/Output this shift: Total I/O In: -  Out: 400 [Urine:400]  Labs: Recent Labs    06/04/22 1925 06/05/22 0419 06/05/22 0731 06/06/22 0431  HGB 8.7* 10.3* 8.9* 9.8*   Recent Labs    06/05/22 0419 06/05/22 0731 06/06/22 0431  WBC 5.4  --  4.6  RBC 3.30*  --  3.17*  HCT 32.1* 27.3* 30.8*  PLT 197  --  217   Recent Labs    06/06/22 0431  NA 136  K 4.8  CL 107  CO2 23  BUN 49*  CREATININE 1.42*  GLUCOSE 115*  CALCIUM 8.3*   No results for input(s): "LABPT", "INR" in the last 72 hours.    EXAM General - Patient is Alert and Appropriate Extremity - Patient is wearing a sling to the left arm this morning.  Polar Care intact with cooler with ice.  Patient with polar care just laying on the arm, not strapped around it. Swelling and ecchymosis present.  Compartments are soft throughout the left upper extremity.  Swelling and ecchymosis are both improving. Incision site is intact, honeycomb dressing clean dry  and intact  Both the upper and lower portions of the arm are soft to palpation.  ABle to gentle flex and extend the elbow as tolerated today. Radial pulse intact.  Swelling in the hand has significantly improved.  She is able to make a full fist this afternoon. No evidence for compartment syndrome  Past Medical History:  Diagnosis Date   A-fib ([place]) 2016   Acid reflux    Acute respiratory failure ([place]) 04/09/2018   Anemia    Anxiety    Atrial flutter ([place]) 08/24/2017   Bowel obstruction (HCC)    Carotid artery disease (HCC)    Chronic kidney disease (CKD) stage G3a/A1, moderately decreased glomerular filtration rate (GFR) between 45-59 mL/min/1.73 square meter and albuminuria creatinine ratio less than 30 mg/g (HCC)    stage IV   Closed fracture of one rib of right side 05/29/2020   Complete tear of left rotator cuff 10/16/2014   Coronary artery disease    Depression    Diabetes mellitus without complication ([place])    Diarrhea 03/04/2022   Dyspnea    Dysrhythmia    PAROXYSMAL ATRIAL FIBRILLATION   Fatty liver    Gastritis without bleeding    GI bleed    Gout    Hypertension    Hypothyroidism    Low back pain    history of kyphoplasty t12-l1   Morbid obesity ([place])    Multiple  gastric polyps    history of gi bleed.  polyps removed   Neuromuscular disorder ([place])    diabetic neuropathy   Osteoarthritis    Osteoporosis    Pacemaker 04/09/2018   MDT- upgraded to duel chamber device Dec 2018- lead revision post upgrade dec 2018   Paroxysmal atrial fibrillation ([place])    Presence of permanent cardiac pacemaker    Sinus node dysfunction ([place]) 08/24/2017   Stroke ([place])    TIA, patient unaware that she had it. no residual   Tendinitis of upper biceps tendon of left shoulder 11/10/2019   TIA (transient ischemic attack)    Type 2 diabetes mellitus with neurological complications ([place]) 05/06/538   Uncontrolled type 2 diabetes mellitus with hypoglycemia, with long-term current use of  insulin ([place]) 03/04/2022   Assessment/Plan: 5 Days Post-Op Procedure(s) (LRB): INTRAMEDULLARY (IM) NAIL HUMERAL (Left) Principal Problem:   Humerus fracture Active Problems:   Permanent atrial fibrillation (HCC)   Gastro-esophageal reflux disease without esophagitis   Dizziness   CAD (coronary artery disease)   Hypothyroidism   Acute kidney injury superimposed on chronic kidney disease (HCC)   Drop in hemoglobin   Elevated liver function tests   History of gout   Restless leg syndrome  Estimated body mass index is 30.77 kg/m as calculated from the following:   Height as of this encounter: 5\' 3"  (1.6 m).   Weight as of this encounter: 78.8 kg. Advance diet Up with therapy  Vital signs stable. Pain controlled.  Reports her pain is improving. Continue with sling/Polar Care unit left upper extremity. Patient with several bowel movements. Care manager to assist with discharge to skilled nursing facility.  Upon discharge from hospital, follow-up with [facility] in 10-14 days for staple removal and x-rays of the left humerus.  DVT Prophylaxis - Lovenox and SCDs Non-weightbearing to the left arm.  Deborah Khyren Hing, PA-C [facility] 06/07/2022, 3:21 PM

## 2022-06-07 NOTE — Plan of Care (Signed)
  Problem: Nutritional: Goal: Maintenance of adequate nutrition will improve Outcome: Progressing   Problem: Nutritional: Goal: Progress toward achieving an optimal weight will improve Outcome: Progressing   Problem: Skin Integrity: Goal: Risk for impaired skin integrity will decrease Outcome: Progressing   Problem: Education: Goal: Knowledge of General Education information will improve Description: Including pain rating scale, medication(s)/side effects and non-pharmacologic comfort measures Outcome: Progressing   Problem: Elimination: Goal: Will not experience complications related to bowel motility Outcome: Progressing

## 2022-06-07 NOTE — Progress Notes (Signed)
Physical Therapy Treatment Patient Details Name: Deborah Obrien MRN: [id number] DOB: 08/23/1941 Today's Date: 06/07/2022   History of Present Illness Pt is an 81 y.o. female presenting to hospital 9/17 s/p fall (tripped and fell over her purse while visiting her husband at his nursing facility); hit L arm and c/o severe pain in middle of L UE that moves up into her shoulder; c/o some pain in R knee; also striking her head.  H/o recent falls.  Imaging showing comminuted and angulated fx of the midshaft of the L humerus (spiral displaced fx L humeral shaft).  Pt now s/p L humeral IM nail on 06/02/22.  PMH includes htn, DM, CHF, a-fib on Eliquis, CKD, anemia, GERD, gout, pacemaker, stroke, and chronic pain syndrome.    PT Comments    Pt resting in recliner upon PT arrival; agreeable to PT session; 5/10 L UE pain during session (pt pre-medicated with pain meds for therapy).  During session pt CGA with transfers and ambulation 50 feet with use of hemi-walker (of note, even with sling in place, pt requiring support of L UE from therapist to improve comfort/pain in order to ambulate).  Pt with short step length, decreased B LE foot clearance, and slow gait speed during ambulation.  Will continue to focus on strengthening, balance, and progressive functional mobility during hospitalization.   Recommendations for follow up therapy are one component of a multi-disciplinary discharge planning process, led by the attending physician.  Recommendations may be updated based on patient status, additional functional criteria and insurance authorization.  Follow Up Recommendations  Skilled nursing-short term rehab (<3 hours/day) Can patient physically be transported by private vehicle: Yes   Assistance Recommended at Discharge Frequent or constant Supervision/Assistance  Patient can return home with the following A little help with walking and/or transfers;A lot of help with bathing/dressing/bathroom;Assistance  with cooking/housework;Assist for transportation;Help with stairs or ramp for entrance   Equipment Recommendations  BSC/3in1;Other (comment) (hemi-walker)    Recommendations for Other Services       Precautions / Restrictions Precautions Precautions: Fall Precaution Comments: Aspiration Required Braces or Orthoses: Sling Splint/Cast: LUE sling in place Restrictions Weight Bearing Restrictions: Yes LUE Weight Bearing: Non weight bearing     Mobility  Bed Mobility               General bed mobility comments: Deferred (pt sitting in recliner beginning/end of session)    Transfers Overall transfer level: Needs assistance Equipment used: Hemi-walker Transfers: Sit to/from Stand Sit to Stand: Min guard           General transfer comment: mild increased effort/time to perform on own    Ambulation/Gait Ambulation/Gait assistance: Min guard (assist to support L UE (even in sling) to improve pain/comfort) Gait Distance (Feet): 50 Feet Assistive device: Hemi-walker   Gait velocity: decreased     General Gait Details: short step length; decreased B LE foot clearance, decreased BOS; flexed posture   Stairs             Wheelchair Mobility    Modified Rankin (Stroke Patients Only)       Balance Overall balance assessment: Needs assistance Sitting-balance support: No upper extremity supported, Feet supported Sitting balance-Leahy Scale: Good Sitting balance - Comments: steady sitting reaching within BOS with R UE   Standing balance support: Single extremity supported, During functional activity, Reliant on assistive device for balance Standing balance-Leahy Scale: Fair Standing balance comment: use of hemi-walker for balance/support  Cognition Arousal/Alertness: Awake/alert Behavior During Therapy: WFL for tasks assessed/performed Overall Cognitive Status: Within Functional Limits for tasks assessed                                           Exercises      General Comments  Nursing cleared pt for participation in physical therapy.  Pt agreeable to PT session.      Pertinent Vitals/Pain Pain Assessment Pain Assessment: 0-10 Pain Score: 5  Pain Descriptors / Indicators: Aching, Sore Pain Intervention(s): Limited activity within patient's tolerance, Monitored during session, Premedicated before session, Repositioned (polar care applied) Vitals (HR and O2 on room air) stable and WFL throughout treatment session.    Home Living                          Prior Function            PT Goals (current goals can now be found in the care plan section) Acute Rehab PT Goals Patient Stated Goal: to improve pain and mobility PT Goal Formulation: With patient Time For Goal Achievement: 06/13/22 Potential to Achieve Goals: Fair Progress towards PT goals: Progressing toward goals    Frequency    7X/week      PT Plan Current plan remains appropriate    Co-evaluation              AM-PAC PT "6 Clicks" Mobility   Outcome Measure  Help needed turning from your back to your side while in a flat bed without using bedrails?: A Little Help needed moving from lying on your back to sitting on the side of a flat bed without using bedrails?: A Little Help needed moving to and from a bed to a chair (including a wheelchair)?: A Little Help needed standing up from a chair using your arms (e.g., wheelchair or bedside chair)?: A Little Help needed to walk in hospital room?: A Little Help needed climbing 3-5 steps with a railing? : A Lot 6 Click Score: 17    End of Session Equipment Utilized During Treatment: Gait belt;Other (comment) (L UE shoulder immobilizer) Activity Tolerance: Patient tolerated treatment well Patient left: in chair;with call bell/phone within reach;with chair alarm set;with nursing/sitter in room;with SCD's reapplied Nurse Communication: Mobility  status;Precautions PT Visit Diagnosis: Other abnormalities of gait and mobility (R26.89);Muscle weakness (generalized) (M62.81);History of falling (Z91.81);Pain Pain - Right/Left: Left Pain - part of body: Arm     Time: 1135-1203 PT Time Calculation (min) (ACUTE ONLY): 28 min  Charges:  $Gait Training: 8-22 mins $Therapeutic Activity: 8-22 mins                    Leitha Bleak, PT 06/07/22, 12:20 PM

## 2022-06-07 NOTE — TOC Progression Note (Signed)
Transition of Care [facility]) - Progression Note    Patient Details  Name: Deborah Obrien MRN: [id number] Date of Birth: 1941-05-16  [facility]) CM/SW [place], RN Phone Number: 06/07/2022, 9:04 AM  Clinical Narrative:     Insurance is pending, I called the insurance company to check the status and see if we can get approval, they needed updated pt notes Uploaded the updated PT and OT notes as well as Progress notes to the Portal       Expected Discharge Plan and Services                                                 Social Determinants of Health (SDOH) Interventions    Readmission Risk Interventions     No data to display

## 2022-06-08 ENCOUNTER — Encounter: Payer: Self-pay | Admitting: Internal Medicine

## 2022-06-08 DIAGNOSIS — I4821 Permanent atrial fibrillation: Secondary | ICD-10-CM

## 2022-06-08 DIAGNOSIS — Z4789 Encounter for other orthopedic aftercare: Secondary | ICD-10-CM | POA: Diagnosis not present

## 2022-06-08 DIAGNOSIS — E1149 Type 2 diabetes mellitus with other diabetic neurological complication: Secondary | ICD-10-CM | POA: Diagnosis not present

## 2022-06-08 DIAGNOSIS — R710 Precipitous drop in hematocrit: Secondary | ICD-10-CM | POA: Diagnosis not present

## 2022-06-08 DIAGNOSIS — M6281 Muscle weakness (generalized): Secondary | ICD-10-CM | POA: Diagnosis not present

## 2022-06-08 DIAGNOSIS — G2581 Restless legs syndrome: Secondary | ICD-10-CM

## 2022-06-08 DIAGNOSIS — N179 Acute kidney failure, unspecified: Secondary | ICD-10-CM | POA: Diagnosis not present

## 2022-06-08 DIAGNOSIS — R1311 Dysphagia, oral phase: Secondary | ICD-10-CM | POA: Diagnosis not present

## 2022-06-08 DIAGNOSIS — N189 Chronic kidney disease, unspecified: Secondary | ICD-10-CM

## 2022-06-08 DIAGNOSIS — N184 Chronic kidney disease, stage 4 (severe): Secondary | ICD-10-CM | POA: Diagnosis not present

## 2022-06-08 DIAGNOSIS — S42202S Unspecified fracture of upper end of left humerus, sequela: Secondary | ICD-10-CM | POA: Diagnosis not present

## 2022-06-08 DIAGNOSIS — M054 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: Secondary | ICD-10-CM | POA: Diagnosis not present

## 2022-06-08 DIAGNOSIS — S42352S Displaced comminuted fracture of shaft of humerus, left arm, sequela: Secondary | ICD-10-CM | POA: Diagnosis not present

## 2022-06-08 DIAGNOSIS — R2681 Unsteadiness on feet: Secondary | ICD-10-CM | POA: Diagnosis not present

## 2022-06-08 DIAGNOSIS — S42202A Unspecified fracture of upper end of left humerus, initial encounter for closed fracture: Secondary | ICD-10-CM | POA: Diagnosis not present

## 2022-06-08 LAB — CBC WITH DIFFERENTIAL/PLATELET
Abs Immature Granulocytes: 0.18 10*3/uL — ABNORMAL HIGH (ref 0.00–0.07)
Basophils Absolute: 0 10*3/uL (ref 0.0–0.1)
Basophils Relative: 0 %
Eosinophils Absolute: 0.1 10*3/uL (ref 0.0–0.5)
Eosinophils Relative: 2 %
HCT: 27.3 % — ABNORMAL LOW (ref 36.0–46.0)
Hemoglobin: 9 g/dL — ABNORMAL LOW (ref 12.0–15.0)
Immature Granulocytes: 3 %
Lymphocytes Relative: 32 %
Lymphs Abs: 1.8 10*3/uL (ref 0.7–4.0)
MCH: 31.7 pg (ref 26.0–34.0)
MCHC: 33 g/dL (ref 30.0–36.0)
MCV: 96.1 fL (ref 80.0–100.0)
Monocytes Absolute: 0.5 10*3/uL (ref 0.1–1.0)
Monocytes Relative: 9 %
Neutro Abs: 3 10*3/uL (ref 1.7–7.7)
Neutrophils Relative %: 54 %
Platelets: 244 10*3/uL (ref 150–400)
RBC: 2.84 MIL/uL — ABNORMAL LOW (ref 3.87–5.11)
RDW: 13.5 % (ref 11.5–15.5)
WBC: 5.7 10*3/uL (ref 4.0–10.5)
nRBC: 0.5 % — ABNORMAL HIGH (ref 0.0–0.2)

## 2022-06-08 LAB — HEPATIC FUNCTION PANEL
ALT: 10 U/L (ref 0–44)
AST: 31 U/L (ref 15–41)
Albumin: 2.6 g/dL — ABNORMAL LOW (ref 3.5–5.0)
Alkaline Phosphatase: 240 U/L — ABNORMAL HIGH (ref 38–126)
Bilirubin, Direct: 0.3 mg/dL — ABNORMAL HIGH (ref 0.0–0.2)
Indirect Bilirubin: 0.8 mg/dL (ref 0.3–0.9)
Total Bilirubin: 1.1 mg/dL (ref 0.3–1.2)
Total Protein: 5.9 g/dL — ABNORMAL LOW (ref 6.5–8.1)

## 2022-06-08 LAB — BASIC METABOLIC PANEL
Anion gap: 7 (ref 5–15)
BUN: 24 mg/dL — ABNORMAL HIGH (ref 8–23)
CO2: 27 mmol/L (ref 22–32)
Calcium: 8.4 mg/dL — ABNORMAL LOW (ref 8.9–10.3)
Chloride: 107 mmol/L (ref 98–111)
Creatinine, Ser: 0.95 mg/dL (ref 0.44–1.00)
GFR, Estimated: >60 mL/min (ref >60)
Glucose, Bld: 152 mg/dL — ABNORMAL HIGH (ref 70–99)
Potassium: 4.6 mmol/L (ref 3.5–5.1)
Sodium: 141 mmol/L (ref 135–145)

## 2022-06-08 LAB — GLUCOSE, CAPILLARY
Glucose-Capillary: 107 mg/dL — ABNORMAL HIGH (ref 70–99)
Glucose-Capillary: 198 mg/dL — ABNORMAL HIGH (ref 70–99)
Glucose-Capillary: 214 mg/dL — ABNORMAL HIGH (ref 70–99)

## 2022-06-08 MED ORDER — METOPROLOL TARTRATE 25 MG PO TABS: 12.5000 mg | ORAL_TABLET | Freq: Two times a day (BID) | ORAL | Status: DC

## 2022-06-08 MED ORDER — ACETAMINOPHEN 325 MG PO TABS: 650.0000 mg | ORAL_TABLET | Freq: Four times a day (QID) | ORAL | Status: DC | PRN

## 2022-06-08 MED ORDER — PRAVASTATIN SODIUM 20 MG PO TABS: 20.0000 mg | ORAL_TABLET | Freq: Every day | ORAL | Status: DC

## 2022-06-08 MED ORDER — ROPINIROLE HCL 0.25 MG PO TABS: 0.2500 mg | ORAL_TABLET | Freq: Every day | ORAL | Status: DC

## 2022-06-08 NOTE — TOC Progression Note (Signed)
Transition of Care [facility]) - Progression Note    Patient Details  Name: Deborah Obrien MRN: [id number] Date of Birth: [birth year]  [facility] CM/SW Contact  Coralee Pesa, [place] Phone Number: 06/08/2022, 11:27 AM  Clinical Narrative:     CSW notified that [facility] is now unable to offer a bed. CSW spoke with son Deborah Obrien to provide additional offers. Son reviewed with pt and they chose [facility]. Authorization changed to reflect new facility and is good until tomorrow. [facility] currently reviewing to see if they can accept today. TOC will continue to follow for DC needs.  Expected Discharge Plan: Skilled Nursing Facility Barriers to Discharge: Insurance Authorization  Expected Discharge Plan and Services Expected Discharge Plan: [place]                                               Social Determinants of Health (SDOH) Interventions    Readmission Risk Interventions     No data to display

## 2022-06-08 NOTE — TOC Transition Note (Signed)
Transition of Care [facility]) - CM/SW Discharge Note   Patient Details  Name: Deborah Obrien MRN: [id number] Date of Birth: 17-Oct-1940  Transition of [facility] CM/SW Contact:  Coralee Pesa, [place] Phone Number: 06/08/2022, 4:35 PM   Clinical Narrative:    Pt to be transported to facility via safe transport.  Nurse to call report to [phone] Rm [phone]   Final next level of care: [place] Barriers to Discharge: Barriers Resolved   Patient Goals and CMS Choice        Discharge Placement              Patient chooses bed at: [facility] Patient to be transferred to facility by: [facility]AF[facility] transport Name of family member notified: Lennette Bihari Patient and family notified of of transfer: 06/08/22  Discharge Plan and Services                                     Social Determinants of Health (SDOH) Interventions     Readmission Risk Interventions     No data to display

## 2022-06-08 NOTE — Plan of Care (Signed)
  Problem: Education: Goal: Ability to describe self-care measures that may prevent or decrease complications (Diabetes Survival Skills Education) will improve Outcome: Progressing   Problem: Fluid Volume: Goal: Ability to maintain a balanced intake and output will improve Outcome: Progressing   Problem: Clinical Measurements: Goal: Ability to maintain clinical measurements within normal limits will improve Outcome: Progressing   Problem: Clinical Measurements: Goal: Will remain free from infection Outcome: Progressing   Problem: Pain Managment: Goal: General experience of comfort will improve Outcome: Progressing   Problem: Clinical Measurements: Goal: Ability to maintain clinical measurements within normal limits will improve Outcome: Progressing   Problem: Clinical Measurements: Goal: Will remain free from infection Outcome: Progressing

## 2022-06-08 NOTE — Progress Notes (Signed)
Physical Therapy Treatment Patient Details Name: Deborah Obrien MRN: [id number] DOB: Jun 23, 1941 Today's Date: 06/08/2022   History of Present Illness Pt is an 81 y.o. female presenting to hospital 9/17 s/p fall (tripped and fell over her purse while visiting her husband at his nursing facility); hit L arm and c/o severe pain in middle of L UE that moves up into her shoulder; c/o some pain in R knee; also striking her head.  H/o recent falls.  Imaging showing comminuted and angulated fx of the midshaft of the L humerus (spiral displaced fx L humeral shaft).  Pt now s/p L humeral IM nail on 06/02/22.  PMH includes htn, DM, CHF, a-fib on Eliquis, CKD, anemia, GERD, gout, pacemaker, stroke, and chronic pain syndrome.    PT Comments    Pt was sitting in recliner upon arrival. She greets Chief Strategy Officer and is oriented. Does continue to endorses L shoulder/arm pain but states it seems to be "slightly better". Rated 6/10. She continues to present with severe LUE swelling/inflammation. Educated on importance of ice/polar care and need to move fingers and wrist to promote circulation. She states understanding. Session included pt standing from recliner to RUE hemi walker. Tolerated ambulation ~ 60 ft with slow, step to pattern. Vcs throughout gait to improve posture however remains flex. Pt is progressing towards PT goal however still not at baseline. Highly recommend DC to SNF to address deficits while maximizing independence with ADLs. Pt to discuss with family post acute DC disposition.    Recommendations for follow up therapy are one component of a multi-disciplinary discharge planning process, led by the attending physician.  Recommendations may be updated based on patient status, additional functional criteria and insurance authorization.  Follow Up Recommendations  Skilled nursing-short term rehab (<3 hours/day) (Pt lives alone and does not have avaiilable assistance at DC. SNF appropriate to maximize  independence and safety with all ADLs)     Assistance Recommended at Discharge Frequent or constant Supervision/Assistance  Patient can return home with the following A little help with walking and/or transfers;A lot of help with bathing/dressing/bathroom;Assistance with cooking/housework;Assist for transportation;Help with stairs or ramp for entrance   Equipment Recommendations  BSC/3in1;Other (comment) (hemi walker)       Precautions / Restrictions Precautions Precautions: Fall Precaution Comments: Aspiration Required Braces or Orthoses: Sling Splint/Cast: LUE sling in place Restrictions Weight Bearing Restrictions: Yes LUE Weight Bearing: Non weight bearing     Mobility  Bed Mobility  General bed mobility comments: In recliner pre/post session  Transfers Overall transfer level: Needs assistance Equipment used: Hemi-walker Transfers: Sit to/from Stand Sit to Stand: Min guard      General transfer comment: Vcs for improved technique. decreased fwd wt shift noted. does have good eccentric control with stand>sit    Ambulation/Gait Ambulation/Gait assistance: Min guard (adjusted sling for pt comfort) Gait Distance (Feet): 60 Feet Assistive device: Hemi-walker Gait Pattern/deviations: Trunk flexed (slow cadence) Gait velocity: extremely decreased     General Gait Details: Pt ambulated with hemiwalker ~ 60 ft without LOB. CGA for safety. Vcs throughout for improved posture however pt struggles. distance limited by pain/fatigue.   Pt is unsteady with +1 UE support only. High fall risk with pt reporting several falls in past year.  Balance           Cognition Arousal/Alertness: Awake/alert Behavior During Therapy: WFL for tasks assessed/performed Overall Cognitive Status: Within Functional Limits for tasks assessed    General Comments: Pt is A and O x 4  General Comments General comments (skin integrity, edema, etc.): Adjusted sling for pt comfort. reviewed  importance of moving wrist,hand and elbow. Also re-educated on importance of icing(polar care) to assist with decreasing swelling/inflammation. LUE still very swollen but per PA noted, has been decreasing.      Pertinent Vitals/Pain Pain Assessment Pain Assessment: 0-10 Pain Score: 6  Pain Location: L UE/shoulder Pain Descriptors / Indicators: Aching, Sore Pain Intervention(s): Limited activity within patient's tolerance, Monitored during session, Premedicated before session, Repositioned, Ice applied     PT Goals (current goals can now be found in the care plan section) Acute Rehab PT Goals Patient Stated Goal: rehab then home Progress towards PT goals: Progressing toward goals    Frequency    7X/week      PT Plan Current plan remains appropriate       AM-PAC PT "6 Clicks" Mobility   Outcome Measure  Help needed turning from your back to your side while in a flat bed without using bedrails?: A Little Help needed moving from lying on your back to sitting on the side of a flat bed without using bedrails?: A Little Help needed moving to and from a bed to a chair (including a wheelchair)?: A Little Help needed standing up from a chair using your arms (e.g., wheelchair or bedside chair)?: A Little Help needed to walk in hospital room?: A Little Help needed climbing 3-5 steps with a railing? : A Lot 6 Click Score: 17    End of Session Equipment Utilized During Treatment: Other (comment) (LUE sling) Activity Tolerance: Patient tolerated treatment well Patient left: in chair;with call bell/phone within reach;with chair alarm set;with nursing/sitter in room;with SCD's reapplied Nurse Communication: Mobility status;Precautions PT Visit Diagnosis: Other abnormalities of gait and mobility (R26.89);Muscle weakness (generalized) (M62.81);History of falling (Z91.81);Pain Pain - Right/Left: Left Pain - part of body: Arm     Time: 5537-4827 PT Time Calculation (min) (ACUTE ONLY):  28 min  Charges:  $Gait Training: 8-22 mins $Therapeutic Activity: 8-22 mins                     Julaine Fusi PTA 06/08/22, 9:41 AM

## 2022-06-08 NOTE — Progress Notes (Addendum)
Subjective: 6 Days Post-Op Procedure(s) (LRB): INTRAMEDULLARY (IM) NAIL HUMERAL (Left) Patient reports pain as mild this morning.  Patient reports that her pain is continuing to improve to the left shoulder Plan is to go Skilled nursing facility after hospital stay. Negative for chest pain and shortness of breath Fever: no Gastrointestinal:Negative for nausea and vomiting this morning. Patient has had several bowel movements since surgery.  Objective: Vital signs in last 24 hours: Temp:  [97.7 F (36.5 C)-98.1 F (36.7 C)] 98.1 F (36.7 C) (09/27 0007) Pulse Rate:  [59-60] 60 (09/27 0007) Resp:  [16] 16 (09/27 0007) BP: (112-142)/(47-74) 130/47 (09/27 0007) SpO2:  [98 %-100 %] 99 % (09/27 0007)  Intake/Output from previous day:  Intake/Output Summary (Last 24 hours) at 06/08/2022 0807 Last data filed at 06/08/2022 0749 Gross per 24 hour  Intake --  Output 1700 ml  Net -1700 ml    Intake/Output this shift: Total I/O In: -  Out: 500 [Urine:500]  Labs: Recent Labs    06/06/22 0431 06/08/22 0411  HGB 9.8* 9.0*   Recent Labs    06/06/22 0431 06/08/22 0411  WBC 4.6 5.7  RBC 3.17* 2.84*  HCT 30.8* 27.3*  PLT 217 244   Recent Labs    06/06/22 0431 06/08/22 0411  NA 136 141  K 4.8 4.6  CL 107 107  CO2 23 27  BUN 49* 24*  CREATININE 1.42* 0.95  GLUCOSE 115* 152*  CALCIUM 8.3* 8.4*   No results for input(s): "LABPT", "INR" in the last 72 hours.    EXAM General - Patient is Alert and Appropriate Extremity - Patient is wearing a sling to the left arm this morning.  Polar Care intact with cooler with ice.  Patient with polar care just laying on the arm, not strapped around it. Swelling and ecchymosis present.  Compartments are soft throughout the left upper extremity.  Swelling and ecchymosis are both improving. Incision site is intact, honeycomb dressing clean dry and intact  Both the upper and lower portions of the arm are soft to palpation.  ABle to gentle  flex and extend the elbow as tolerated today.  Intact finger flexion and extension, intact wrist flexion and extension. Radial pulse intact.  Swelling in the hand has significantly improved.   No evidence for compartment syndrome  Past Medical History:  Diagnosis Date   A-fib ([place]) 2016   Acid reflux    Acute respiratory failure ([place]) 04/09/2018   Anemia    Anxiety    Atrial flutter ([place]) 08/24/2017   Bowel obstruction (HCC)    Carotid artery disease (HCC)    Chronic kidney disease (CKD) stage G3a/A1, moderately decreased glomerular filtration rate (GFR) between 45-59 mL/min/1.73 square meter and albuminuria creatinine ratio less than 30 mg/g (HCC)    stage IV   Closed fracture of one rib of right side 05/29/2020   Complete tear of left rotator cuff 10/16/2014   Coronary artery disease    Depression    Diabetes mellitus without complication ([place])    Diarrhea 03/04/2022   Dyspnea    Dysrhythmia    PAROXYSMAL ATRIAL FIBRILLATION   Fatty liver    Gastritis without bleeding    GI bleed    Gout    Hypertension    Hypothyroidism    Low back pain    history of kyphoplasty t12-l1   Morbid obesity ([place])    Multiple gastric polyps    history of gi bleed.  polyps removed   Neuromuscular disorder (  [place])    diabetic neuropathy   Osteoarthritis    Osteoporosis    Pacemaker 04/09/2018   MDT- upgraded to duel chamber device Dec 2018- lead revision post upgrade dec 2018   Paroxysmal atrial fibrillation ([place])    Presence of permanent cardiac pacemaker    Sinus node dysfunction ([place]) 08/24/2017   Stroke ([place])    TIA, patient unaware that she had it. no residual   Tendinitis of upper biceps tendon of left shoulder 11/10/2019   TIA (transient ischemic attack)    Type 2 diabetes mellitus with neurological complications ([place]) 1/61/0960   Uncontrolled type 2 diabetes mellitus with hypoglycemia, with long-term current use of insulin ([place]) 03/04/2022   Assessment/Plan: 6 Days Post-Op  Procedure(s) (LRB): INTRAMEDULLARY (IM) NAIL HUMERAL (Left) Principal Problem:   Humerus fracture Active Problems:   Permanent atrial fibrillation (HCC)   Gastro-esophageal reflux disease without esophagitis   Dizziness   CAD (coronary artery disease)   Hypothyroidism   Acute kidney injury superimposed on chronic kidney disease (HCC)   Drop in hemoglobin   Elevated liver function tests   History of gout   Restless leg syndrome  Estimated body mass index is 30.77 kg/m as calculated from the following:   Height as of this encounter: 5\' 3"  (1.6 m).   Weight as of this encounter: 78.8 kg. Advance diet Up with therapy  Vital signs stable.  Hg 9.0 this morning. Pain controlled.  Reports her pain is improving. Continue with sling/Polar Care unit left upper extremity. Patient with several bowel movements. Care manager to assist with discharge to skilled nursing facility.  Upon discharge from hospital, follow-up with [facility] in 10-14 days for staple removal and x-rays of the left humerus.  DVT Prophylaxis -  SCDs and Eliquis Non-weightbearing to the left arm.  Raquel Andilynn Delavega, PA-C [name] 06/08/2022, 8:07 AM

## 2022-06-08 NOTE — Progress Notes (Signed)
Occupational Therapy Treatment Patient Details Name: Deborah Obrien MRN: [id number] DOB: [birth year] Today's Date: 06/08/2022   History of present illness Pt is an 81 y.o. female presenting to hospital 9/17 s/p fall (tripped and fell over her purse while visiting her husband at his nursing facility); hit L arm and c/o severe pain in middle of L UE that moves up into her shoulder; c/o some pain in R knee; also striking her head.  H/o recent falls.  Imaging showing comminuted and angulated fx of the midshaft of the L humerus (spiral displaced fx L humeral shaft).  Pt now s/p L humeral IM nail on 06/02/22.  PMH includes htn, DM, CHF, a-fib on Eliquis, CKD, anemia, GERD, gout, pacemaker, stroke, and chronic pain syndrome.   OT comments  Chart reviewed, pt greeted in chair agreeable to OT tx session. Tx session targeted improving activity tolerance, indep in ADL tasks. Improvements noted in tolerance for bathing/dressing tasks. STS completed with CGA with hemi walker, pt able to sustain standing for approx 3 minutes. Pt is left in bedside chair, all needs met. OT will continue to follow acutely.    Recommendations for follow up therapy are one component of a multi-disciplinary discharge planning process, led by the attending physician.  Recommendations may be updated based on patient status, additional functional criteria and insurance authorization.    Follow Up Recommendations  Skilled nursing-short term rehab (<3 hours/day)    Assistance Recommended at Discharge Frequent or constant Supervision/Assistance  Patient can return home with the following  A lot of help with bathing/dressing/bathroom;Assistance with cooking/housework;Direct supervision/assist for medications management;Assist for transportation;A lot of help with walking and/or transfers;Assistance with feeding   Equipment Recommendations  Other (comment) (defer)    Recommendations for Other Services      Precautions / Restrictions  Precautions Precautions: Fall Required Braces or Orthoses: Sling Restrictions Weight Bearing Restrictions: Yes LUE Weight Bearing: Non weight bearing       Mobility Bed Mobility               General bed mobility comments: NT pt in chair pre/post session    Transfers Overall transfer level: Needs assistance Equipment used: Hemi-walker Transfers: Sit to/from Stand Sit to Stand: Min guard                 Balance Overall balance assessment: Needs assistance Sitting-balance support: No upper extremity supported, Feet supported Sitting balance-Leahy Scale: Good     Standing balance support: Single extremity supported, During functional activity, Reliant on assistive device for balance                               ADL either performed or assessed with clinical judgement   ADL Overall ADL's : Needs assistance/impaired Eating/Feeding: Set up;Sitting   Grooming: Minimal assistance;Sitting   Upper Body Bathing: Moderate assistance;Sitting   Lower Body Bathing: Maximal assistance;Sit to/from stand   Upper Body Dressing : Maximal assistance Upper Body Dressing Details (indicate cue type and reason): gown, sling     Toilet Transfer: Minimal assistance Toilet Transfer Details (indicate cue type and reason): simulated Toileting- Clothing Manipulation and Hygiene: Maximal assistance;Sit to/from stand              Extremity/Trunk Assessment              Vision       Perception     Praxis      Cognition Arousal/Alertness: Awake/alert  Behavior During Therapy: WFL for tasks assessed/performed Overall Cognitive Status: Within Functional Limits for tasks assessed                                          Exercises      Shoulder Instructions       General Comments Pt with bleeding near L elbow, RN present to address    Pertinent Vitals/ Pain       Pain Assessment Pain Assessment: 0-10 Pain Score: 6  Pain  Location: L UE/shoulder Pain Descriptors / Indicators: Aching, Sore Pain Intervention(s): Limited activity within patient's tolerance, Monitored during session, Repositioned, Ice applied, Premedicated before session  Home Living                                          Prior Functioning/Environment              Frequency  Min 2X/week        Progress Toward Goals  OT Goals(current goals can now be found in the care plan section)  Progress towards OT goals: Progressing toward goals     Plan Discharge plan remains appropriate    Co-evaluation                 AM-PAC OT "6 Clicks" Daily Activity     Outcome Measure   Help from another person eating meals?: None Help from another person taking care of personal grooming?: A Little Help from another person toileting, which includes using toliet, bedpan, or urinal?: A Lot Help from another person bathing (including washing, rinsing, drying)?: A Lot Help from another person to put on and taking off regular upper body clothing?: A Lot Help from another person to put on and taking off regular lower body clothing?: A Lot 6 Click Score: 15    End of Session Equipment Utilized During Treatment: Other (comment) (hemi walker)  OT Visit Diagnosis: Muscle weakness (generalized) (M62.81);Pain   Activity Tolerance Patient tolerated treatment well   Patient Left in chair;with call bell/phone within reach;with chair alarm set   Nurse Communication Mobility status        Time: 2924-4628 OT Time Calculation (min): 30 min  Charges: OT General Charges $OT Visit: 1 Visit OT Treatments $Self Care/Home Management : 23-37 mins  Shanon Payor, OTD OTR/L  06/08/22, 1:54 PM

## 2022-06-08 NOTE — Discharge Summary (Signed)
Physician Discharge Summary   Patient: Deborah Obrien MRN: [id number] DOB: November 30, 1940  Admit date:     05/29/2022  Discharge date: 06/08/22  Discharge Physician: Jennye Boroughs   PCP: [name], MD   Recommendations at discharge:   Follow-up with PCP in 2 weeks Follow-up with orthopedic surgery team in 10-14 days  Discharge Diagnoses: Principal Problem:   Humerus fracture Active Problems:   Permanent atrial fibrillation (HCC)   Restless leg syndrome   Drop in hemoglobin   Gastro-esophageal reflux disease without esophagitis   Dizziness   CAD (coronary artery disease)   Hypothyroidism   Acute kidney injury superimposed on chronic kidney disease (HCC)   Elevated liver function tests   History of gout  Resolved Problems:   Type 2 diabetes mellitus with neurological complications [facility])  Hospital Course: 81 year old female who lives alone with past medical history of atrial fibrillation on Eliquis, GERD, anxiety, anemia, CKD, diabetes mellitus, hypertension, hypothyroidism, obesity, restless leg syndrome, who presented to the hospital after a fall.  She was found to have a left humerus fracture.  Initially, orthopedic surgeon recommended conservative management.  Unfortunately, pain from left humeral fracture was difficult to control.  Dr. Roland Rack, orthopedic surgeon, took the patient to the operating room on 06/02/2022.  Intramedullary nailing of closed displaced comminuted left humeral shaft fracture was performed.  He was evaluated by PT and OT who recommended further rehabilitation at a skilled nursing facility.  Assessment and Plan: * Humerus fracture S/p intramedullary nailing on 06/02/2022  Restless leg syndrome Continue Requip  Permanent atrial fibrillation (HCC) Continue amiodarone, metoprolol and Eliquis  Acute blood loss anemia S/p transfusion with 1 unit of PRBCs on 06/03/2022.  H&H is stable.    AKI on CKD stage IIIa:  Creatinine has  improved.  Elevated liver enzymes Resolved.  Resume statin at discharge   Other comorbidities include CKD stage IIIa, gout, CAD, hypothyroidism           Consultants: Orthopedic surgeon, cardiologist Procedures performed: Intra medullary nailing of left humeral fracture Disposition: Skilled nursing facility Diet recommendation:  Discharge Diet Orders (From admission, onward)     Start     Ordered   06/08/22 0000  Diet - low sodium heart healthy        06/08/22 1313   06/08/22 0000  Diet Carb Modified        06/08/22 1313           Cardiac and Carb modified diet DISCHARGE MEDICATION: Allergies as of 06/08/2022       Reactions   Cephalosporins Other (See Comments), Diarrhea, Nausea Only   Reaction:  Unknown  Other reaction(s): Other (see comments) Other reaction(s): Other (See Comments) Other Reaction: Intolerance Reaction:  Stomach pain  Other Reaction: Intolerance Reaction:  Unknown    Fish Allergy Shortness Of Breath   All kinds of fish. Severe vomitting even with smell of fish.    Macrolides And Ketolides Other (See Comments)   Severe stomach pain (mycins)   Meperidine Shortness Of Breath, Nausea Only, Other (See Comments)   Stomach pain    Other Nausea Only, Swelling, Rash, Anaphylaxis, Diarrhea, Shortness Of Breath, Other (See Comments)   Allergen: NON-STEROIDS, bextra - hands and feet swell Reaction:  Stomach pain   Other Reaction: Intolerance   Prednisone Anaphylaxis, Other (See Comments)   (facial swelling/redness/burning)Increases pts blood sugar  Pt states that she is allergic to all steroids.     Shellfish Allergy Anaphylaxis, Shortness Of Breath, Diarrhea, Nausea  Only, Rash   Stomach pain    Sulfa Antibiotics Shortness Of Breath, Diarrhea, Nausea Only, Other (See Comments)   Reaction:  Stomach pain  Reaction:  Stomach pain  Other Reaction: Intolerance   Sulfacetamide Sodium Diarrhea, Nausea Only, Other (See Comments), Shortness Of Breath    Reaction:  Stomach pain    Telbivudine Other (See Comments)   Unknown reaction   Uloric [febuxostat] Anaphylaxis   Locks pt's body up   Aspirin Rash, Other (See Comments)   Stomach pain (tolerates lower doses)   Celecoxib Other (See Comments)   GI bleed, weakness, and stomach pain.     Cephalexin Diarrhea, Nausea Only, Other (See Comments)   stomach pain    Codeine Diarrhea, Nausea Only, Other (See Comments)   Reaction:  Stomach pain Pt tolerates morphine    Dilaudid [hydromorphone Hcl] Other (See Comments)   Reactions: easy to overdose - blood pressure drops really low   Erythromycin Diarrhea, Nausea Only, Other (See Comments)   Reaction:  Stomach pain    Iodinated Contrast Media Rash, Other (See Comments)   Pt states that she is unable to have because she has chronic kidney disease.   CKD Pt states that she is unable to have because she has chronic kidney disease.   CKD    Lipitor [atorvastatin Calcium] Nausea Only   Reaction: nausea, weakness, pass blood   Oxycodone Other (See Comments)   Reaction:  Stomach pain    Aleve [naproxen]    Reaction: severe stomach pain   Atorvastatin Nausea Only, Other (See Comments)   Weakness    Doxycycline    Stomach pain    Iodine Rash   Motrin [ibuprofen]    Reaction: severe stomach pain   Tape Other (See Comments)   Causes pts skin to tear  Pt states that she is able to use paper tape.     Valdecoxib Swelling, Other (See Comments)   Pt states that her hands and feet swell.          Medication List     STOP taking these medications    amoxicillin 500 MG capsule Commonly known as: AMOXIL   insulin aspart 100 UNIT/ML injection Commonly known as: novoLOG   insulin glargine-yfgn 100 UNIT/ML injection Commonly known as: SEMGLEE       TAKE these medications    acetaminophen 325 MG tablet Commonly known as: TYLENOL Take 2 tablets (650 mg total) by mouth every 6 (six) hours as needed for mild pain, moderate pain or fever  (or Fever >/= 101).   allopurinol 100 MG tablet Commonly known as: ZYLOPRIM Take 200 mg by mouth daily.   amiodarone 200 MG tablet Commonly known as: PACERONE TAKE 1 TABLET BY MOUTH ONCE DAILY . APPOINTMENT REQUIRED FOR FUTURE REFILLS   apixaban 2.5 MG Tabs tablet Commonly known as: Eliquis Take 1 tablet (2.5 mg total) by mouth 2 (two) times daily.   busPIRone 10 MG tablet Commonly known as: BUSPAR Take 10 mg by mouth 2 (two) times daily.   cholecalciferol 10 MCG (400 UNIT) Tabs tablet Commonly known as: VITAMIN D3 Take 400 Units by mouth in the morning and at bedtime.   cyanocobalamin 1000 MCG tablet Commonly known as: VITAMIN B12 Take 1,000 mcg by mouth daily.   docusate sodium 100 MG capsule Commonly known as: COLACE Take 200 mg by mouth 2 (two) times daily.   DULoxetine 30 MG capsule Commonly known as: CYMBALTA Take 30 mg by mouth daily.   furosemide 20  MG tablet Commonly known as: LASIX Take 2 tablets (40 mg) by mouth once daily In the morning   gabapentin 300 MG capsule Commonly known as: NEURONTIN Take 1 capsule (300 mg) by mouth three times a day   HumaLOG 100 UNIT/ML injection Generic drug: insulin lispro Inject 0-12 Units into the skin 3 (three) times daily with meals. Sliding scale   ketoconazole 2 % cream Commonly known as: NIZORAL Apply 1 application topically daily as needed (rash/skin irritation.).   Lantus 100 UNIT/ML injection Generic drug: insulin glargine SMARTSIG:14 Unit(s) SUB-Q Daily   levothyroxine 50 MCG tablet Commonly known as: SYNTHROID Take 50 mcg by mouth daily before breakfast.   metoprolol tartrate 25 MG tablet Commonly known as: LOPRESSOR Take 0.5 tablets (12.5 mg total) by mouth 2 (two) times daily.   MULTIVITAMIN/MINERALS PO Take 1 tablet by mouth daily.   omeprazole 20 MG capsule Commonly known as: PRILOSEC TAKE 1 CAPSULE BY MOUTH TWICE DAILY BEFORE A MEAL   oxybutynin 5 MG tablet Commonly known as:  DITROPAN Take 10 mg by mouth 2 (two) times daily. Breakfast & bedtime   oxyCODONE 5 MG immediate release tablet Commonly known as: Oxy IR/ROXICODONE Take 1 tablet (5 mg total) by mouth every 6 (six) hours as needed for moderate pain or breakthrough pain.   Polyethyl Glycol-Propyl Glycol 0.4-0.3 % Soln Place 1-2 drops into both eyes 4 (four) times daily as needed (dry eyes).   pravastatin 20 MG tablet Commonly known as: PRAVACHOL Take 20 mg by mouth at bedtime.   rOPINIRole 0.25 MG tablet Commonly known as: REQUIP Take 1 tablet (0.25 mg total) by mouth at bedtime.   talc powder Apply 1 application topically as needed (under the breast irritation).   traZODone 50 MG tablet Commonly known as: DESYREL Take 50 mg by mouth at bedtime.               Discharge Care Instructions  (From admission, onward)           Start     Ordered   06/08/22 0000  Discharge wound care:       Comments: Follow-up with orthopedic surgery team in 10 to 14 days to remove staples   06/08/22 1313            Contact information for follow-up providers     Lattie Corns, PA-C Follow up.   Specialty: Physician Assistant Why: Staple Removal and x-rays. Contact information: [place] [address] [phone]              Contact information for after-discharge care     Destination     [facility] Preferred SNF .   Service: Skilled Nursing Contact information: [address] [place] [phone]                    Discharge Exam: Danley Danker Weights   05/29/22 1456 05/30/22 0204  Weight: 77.1 kg 78.8 kg   GEN: NAD SKIN: Warm and dry EYES: No pallor or icterus ENT: MMM CV: RRR PULM: CTA B ABD: soft, ND, NT, +BS CNS: AAO x 3, non focal EXT: Ecchymoses and swelling in the left arm   Condition at discharge: good  The results of significant diagnostics from this hospitalization  (including imaging, microbiology, ancillary and laboratory) are listed below for reference.   Imaging Studies: DG Chest Port 1 View  Result Date: 06/03/2022 CLINICAL DATA:  Chest pain, recent surgery. EXAM:  PORTABLE CHEST 1 VIEW COMPARISON:  Chest x-ray dated 05/29/2022 FINDINGS: Heart size and mediastinal contours appear stable, incompletely imaged. Lungs are clear. LEFT costophrenic angle is incompletely imaged. No pleural effusion or pneumothorax is seen. LEFT chest wall pacemaker/ICD apparatus appears stable, lowest lead incompletely imaged. Interval surgical changes of LEFT humerus fracture fixation. Visualized portion of the hardware appears appropriately positioned. Expected postsurgical changes within the overlying soft tissues. IMPRESSION: 1. No active disease. No evidence of pneumonia or pulmonary edema. 2. Interval surgical changes of LEFT humerus fracture fixation. Visualized portion of the hardware appears appropriately positioned. Electronically Signed   By: Franki Cabot M.D.   On: 06/03/2022 13:46   DG Humerus Left  Result Date: 06/02/2022 CLINICAL DATA:  Humeral ORIF EXAM: LEFT HUMERUS - 2+ VIEW COMPARISON:  05/30/2022 FINDINGS: Eight C-arm fluoroscopic images were obtained intraoperatively and submitted for post operative interpretation. Images obtained during ORIF of proximal left humeral diaphyseal fracture. Radiation dose: Not provided. Please see the performing provider's procedural report for further detail. IMPRESSION: ORIF of proximal left humeral diaphyseal fracture. Electronically Signed   By: Davina Poke D.O.   On: 06/02/2022 15:58   DG C-Arm 1-60 Min-No Report  Result Date: 06/02/2022 Fluoroscopy was utilized by the requesting physician.  No radiographic interpretation.   CT HEAD WO CONTRAST (5MM)  Result Date: 05/31/2022 CLINICAL DATA:  Fall EXAM: CT HEAD WITHOUT CONTRAST TECHNIQUE: Contiguous axial images were obtained from the base of the skull through the vertex  without intravenous contrast. RADIATION DOSE REDUCTION: This exam was performed according to the departmental dose-optimization program which includes automated exposure control, adjustment of the mA and/or kV according to patient size and/or use of iterative reconstruction technique. COMPARISON:  CT head 05/29/2022 FINDINGS: Limitations-assessment is slightly limited due to motion artifact. Brain: Within the limitations of motion artifact, there is no evidence of acute infarction, hemorrhage, hydrocephalus, extra-axial collection or mass lesion/mass effect. Vascular: No hyperdense vessel or unexpected calcification. Skull: Normal. Negative for fracture or focal lesion. Hyperostosis frontalis. Sinuses/Orbits: Bilateral lens replacement. Other: None. IMPRESSION: Within the limitations of a motion degraded exam CT evidence of intracranial injury. Electronically Signed   By: Marin Roberts M.D.   On: 05/31/2022 16:38   DG Humerus Left  Result Date: 05/30/2022 CLINICAL DATA:  Inpatient encounter for new onset pain of left humerus. Patient reports hearing a popping sound. EXAM: LEFT HUMERUS - 2+ VIEW COMPARISON:  Left humerus radiographs 05/29/2022 FINDINGS: There is again overlying cast material limits evaluation of fine bony detail. There is again an oblique, partial spiral fracture of the proximal and mid diaphysis of the left humerus. Unchanged moderate displacement and minimal lateral apex angulation. There are curvilinear lucencies within the superior aspect of the humeral head suspicious for possible additional nondisplaced fracture lines. 1 mm superomedial humeral head cortical step-off. Mild inferior glenoid partially visualized degenerative osteophytosis. Moderate acromioclavicular joint space narrowing with mild peripheral degenerative osteophytosis. Partial visualization of left chest wall cardiac pacer. IMPRESSION: No significant change in moderate displacement and minimal lateral apex angulation of acute  to subacute oblique, partial spiral fracture of the proximal mid diaphysis of the left humerus. Electronically Signed   By: Yvonne Kendall M.D.   On: 05/30/2022 10:56   DG Humerus Left  Result Date: 05/29/2022 CLINICAL DATA:  Status post reduction EXAM: LEFT HUMERUS - 2+ VIEW COMPARISON:  Films from earlier in the same day. FINDINGS: Midshaft left humeral fracture is again identified. The degree of displacement has been reduced slightly. No new  focal abnormality is noted. IMPRESSION: Status post slight reduction in the mid left humeral fracture. Electronically Signed   By: Inez Catalina M.D.   On: 05/29/2022 22:54   CT Head Wo Contrast  Result Date: 05/29/2022 CLINICAL DATA:  Head trauma, minor (Age >= 65y); Neck trauma (Age >= 65y) EXAM: CT HEAD WITHOUT CONTRAST CT CERVICAL SPINE WITHOUT CONTRAST TECHNIQUE: Multidetector CT imaging of the head and cervical spine was performed following the standard protocol without intravenous contrast. Multiplanar CT image reconstructions of the cervical spine were also generated. RADIATION DOSE REDUCTION: This exam was performed according to the departmental dose-optimization program which includes automated exposure control, adjustment of the mA and/or kV according to patient size and/or use of iterative reconstruction technique. COMPARISON:  None Available. FINDINGS: CT HEAD FINDINGS BRAIN: BRAIN Patchy and confluent areas of decreased attenuation are noted throughout the deep and periventricular white matter of the cerebral hemispheres bilaterally, compatible with chronic microvascular ischemic disease. No evidence of large-territorial acute infarction. No parenchymal hemorrhage. No mass lesion. No extra-axial collection. No mass effect or midline shift. No hydrocephalus. Basilar cisterns are patent. Vascular: No hyperdense vessel. Skull: No acute fracture or focal lesion. Hyperostosis frontalis interna. Sinuses/Orbits: Paranasal sinuses and mastoid air cells are clear.  Bilateral lens replacement. Otherwise the orbits are unremarkable. Other: Trace right frontal scalp hematoma formation (3 mm). CT CERVICAL SPINE FINDINGS Alignment: Normal. Skull base and vertebrae: Multilevel mild-to-moderate degenerative changes of the spine. C6-C7 anterior cervical discectomy and fusion. No acute fracture. No aggressive appearing focal osseous lesion or focal pathologic process. Soft tissues and spinal canal: No prevertebral fluid or swelling. No visible canal hematoma. Upper chest: Trace biapical pleural/pulmonary scarring. Other: None. IMPRESSION: 1. No acute intracranial abnormality. 2. No acute displaced fracture or traumatic listhesis of the cervical spine in a patient status post C6-C7 anterior cervical discectomy and fusion. Electronically Signed   By: Iven Finn M.D.   On: 05/29/2022 19:25   CT Cervical Spine Wo Contrast  Result Date: 05/29/2022 CLINICAL DATA:  Head trauma, minor (Age >= 65y); Neck trauma (Age >= 65y) EXAM: CT HEAD WITHOUT CONTRAST CT CERVICAL SPINE WITHOUT CONTRAST TECHNIQUE: Multidetector CT imaging of the head and cervical spine was performed following the standard protocol without intravenous contrast. Multiplanar CT image reconstructions of the cervical spine were also generated. RADIATION DOSE REDUCTION: This exam was performed according to the departmental dose-optimization program which includes automated exposure control, adjustment of the mA and/or kV according to patient size and/or use of iterative reconstruction technique. COMPARISON:  None Available. FINDINGS: CT HEAD FINDINGS BRAIN: BRAIN Patchy and confluent areas of decreased attenuation are noted throughout the deep and periventricular white matter of the cerebral hemispheres bilaterally, compatible with chronic microvascular ischemic disease. No evidence of large-territorial acute infarction. No parenchymal hemorrhage. No mass lesion. No extra-axial collection. No mass effect or midline shift.  No hydrocephalus. Basilar cisterns are patent. Vascular: No hyperdense vessel. Skull: No acute fracture or focal lesion. Hyperostosis frontalis interna. Sinuses/Orbits: Paranasal sinuses and mastoid air cells are clear. Bilateral lens replacement. Otherwise the orbits are unremarkable. Other: Trace right frontal scalp hematoma formation (3 mm). CT CERVICAL SPINE FINDINGS Alignment: Normal. Skull base and vertebrae: Multilevel mild-to-moderate degenerative changes of the spine. C6-C7 anterior cervical discectomy and fusion. No acute fracture. No aggressive appearing focal osseous lesion or focal pathologic process. Soft tissues and spinal canal: No prevertebral fluid or swelling. No visible canal hematoma. Upper chest: Trace biapical pleural/pulmonary scarring. Other: None. IMPRESSION: 1. No acute intracranial  abnormality. 2. No acute displaced fracture or traumatic listhesis of the cervical spine in a patient status post C6-C7 anterior cervical discectomy and fusion. Electronically Signed   By: Iven Finn M.D.   On: 05/29/2022 19:25   DG Chest Portable 1 View  Result Date: 05/29/2022 CLINICAL DATA:  Fall. EXAM: PORTABLE CHEST 1 VIEW COMPARISON:  None Available. FINDINGS: The heart size and mediastinal contours are within normal limits. Atherosclerotic calcification of the aortic arch. Left IJ access pacemaker with leads terminating in the right atrium and right ventricle. Both lungs are clear without evidence of focal consolidation or pleural effusion. Partially imaged fracture of the left humerus. IMPRESSION: No acute cardiopulmonary process. Partially imaged left humeral comminuted fracture. Electronically Signed   By: Keane Police D.O.   On: 05/29/2022 15:41   DG Knee 2 Views Right  Result Date: 05/29/2022 CLINICAL DATA:  Fall. EXAM: RIGHT KNEE - 1-2 VIEW COMPARISON:  None Available. FINDINGS: Status post right knee arthroplasty. The hardware is intact. No perihardware loosening. No acute fracture or  dislocation. Soft tissues are unremarkable. IMPRESSION: No acute osseous abnormality. Electronically Signed   By: Keane Police D.O.   On: 05/29/2022 15:39   DG Humerus Left  Result Date: 05/29/2022 CLINICAL DATA:  Tripped and fell, left arm deformity EXAM: LEFT HUMERUS - 2+ VIEW COMPARISON:  12/22/2014 FINDINGS: Frontal and lateral views of the left humerus are obtained. There is a comminuted fracture at the junction of the proximal and middle third of the humeral diaphysis, with mild displacement and varus angulation at the fracture site. Alignment of the left shoulder and elbow remains anatomic. Diffuse soft tissue swelling. IMPRESSION: 1. Comminuted left humeral diaphyseal fracture, mild displacement and varus angulation as above. Electronically Signed   By: Randa Ngo M.D.   On: 05/29/2022 15:39   CUP PACEART REMOTE DEVICE CHECK  Result Date: 05/27/2022 Scheduled remote reviewed. Normal device function.  Next remote 91 days. [place]   Microbiology: Results for orders placed or performed during the hospital encounter of 05/29/22  SARS Coronavirus 2 by RT PCR (hospital order, performed in [facility] hospital lab) *cepheid single result test* Anterior Nasal Swab     Status: None   Collection Time: 06/04/22  2:00 PM   Specimen: Anterior Nasal Swab  Result Value Ref Range Status   SARS Coronavirus 2 by RT PCR NEGATIVE NEGATIVE Final    Comment: (NOTE) SARS-CoV-2 target nucleic acids are NOT DETECTED.  The SARS-CoV-2 RNA is generally detectable in upper and lower respiratory specimens during the acute phase of infection. The lowest concentration of SARS-CoV-2 viral copies this assay can detect is 250 copies / mL. A negative result does not preclude SARS-CoV-2 infection and should not be used as the sole basis for treatment or other patient management decisions.  A negative result may occur with improper specimen collection / handling, submission of specimen other than nasopharyngeal swab,  presence of viral mutation(s) within the areas targeted by this assay, and inadequate number of viral copies (<250 copies / mL). A negative result must be combined with clinical observations, patient history, and epidemiological information.  Fact Sheet for Patients:   https://www.patel.info/  Fact Sheet for Healthcare Providers: https://hall.com/  This test is not yet approved or  cleared by the [place] FDA and has been authorized for detection and/or diagnosis of SARS-CoV-2 by FDA under an Emergency Use Authorization (EUA).  This EUA will remain in effect (meaning this test can be used) for the duration of the  COVID-19 declaration under Section 564(b)(1) of the Act, 21 U.S.C. section 360bbb-3(b)(1), unless the authorization is terminated or revoked sooner.  Performed at [facility], [address] [place], [place] [address]     Labs: CBC: Recent Labs  Lab 06/02/22 1002 06/02/22 1707 06/03/22 0140 06/04/22 0240 06/04/22 0840 06/04/22 1925 06/05/22 0419 06/05/22 0731 06/06/22 0431 06/08/22 0411  WBC 3.6* 4.3  --  6.1  --   --  5.4  --  4.6 5.7  NEUTROABS 2.4 3.2  --   --   --   --   --   --  2.3 3.0  HGB 8.7* 8.2*   < > 10.1*   < > 8.7* 10.3* 8.9* 9.8* 9.0*  HCT 28.4* 26.3*   < > 32.2*   < > 26.9* 32.1* 27.3* 30.8* 27.3*  MCV 101.8* 100.4*  --  99.7  --   --  97.3  --  97.2 96.1  PLT 152 149*  --  163  --   --  197  --  217 244   < > = values in this interval not displayed.   Basic Metabolic Panel: Recent Labs  Lab 06/02/22 1707 06/03/22 0944 06/04/22 0840 06/06/22 0431 06/08/22 0411  NA 135 137 134* 136 141  K 4.8 5.7* 5.0 4.8 4.6  CL 102 106 105 107 107  CO2 22 16* 19* 23 27  GLUCOSE 244* 287* 206* 115* 152*  BUN 55* 53* 61* 49* 24*  CREATININE 1.89* 1.83* 2.23* 1.42* 0.95  CALCIUM 8.4* 7.9* 7.9* 8.3* 8.4*   Liver Function Tests: Recent Labs  Lab 06/08/22 0411  AST 31  ALT 10  ALKPHOS  240*  BILITOT 1.1  PROT 5.9*  ALBUMIN 2.6*   CBG: Recent Labs  Lab 06/07/22 1224 06/07/22 1628 06/07/22 2115 06/08/22 0745 06/08/22 1154  GLUCAP 163* 206* 187* 107* 198*    Discharge time spent: greater than 30 minutes.  Signed: Jennye Boroughs, MD [facility] 06/08/2022

## 2022-06-08 NOTE — TOC Progression Note (Signed)
Transition of Care [facility]) - Progression Note    Patient Details  Name: DAVETTA OLLIFF MRN: [id number] Date of Birth: 10/30/1940  Transition of Care [facility]) CM/SW [place], RN Phone Number: 06/08/2022, 8:53 AM  Clinical Narrative:    [name] Paganini with [facility] notified me that they will not have aq bed this week, the patient will have to choose a different facility and Ins auth will have to be changed to new facility   Expected Discharge Plan: [place] Barriers to Discharge: Insurance Authorization  Expected Discharge Plan and Services Expected Discharge Plan: [place]                                               Social Determinants of Health (SDOH) Interventions    Readmission Risk Interventions     No data to display

## 2022-06-08 NOTE — Plan of Care (Signed)
  Problem: Health Behavior/Discharge Planning: Goal: Ability to manage health-related needs will improve Outcome: Progressing   Problem: Nutritional: Goal: Maintenance of adequate nutrition will improve Outcome: Progressing   Problem: Elimination: Goal: Will not experience complications related to bowel motility Outcome: Progressing Goal: Will not experience complications related to urinary retention Outcome: Progressing   Problem: Pain Managment: Goal: General experience of comfort will improve Outcome: Progressing

## 2022-06-09 NOTE — Progress Notes (Signed)
Remote pacemaker transmission.   

## 2022-06-10 DIAGNOSIS — N184 Chronic kidney disease, stage 4 (severe): Secondary | ICD-10-CM | POA: Diagnosis not present

## 2022-06-10 DIAGNOSIS — Z4789 Encounter for other orthopedic aftercare: Secondary | ICD-10-CM | POA: Diagnosis not present

## 2022-06-10 DIAGNOSIS — E1149 Type 2 diabetes mellitus with other diabetic neurological complication: Secondary | ICD-10-CM | POA: Diagnosis not present

## 2022-06-10 DIAGNOSIS — S42352S Displaced comminuted fracture of shaft of humerus, left arm, sequela: Secondary | ICD-10-CM | POA: Diagnosis not present

## 2022-06-14 NOTE — Progress Notes (Signed)
Based on the patient's history, I feel that this fracture would be classified as a "Traumatic fracture of left humerus due to fall from tripping".  Thank you!

## 2022-06-15 DIAGNOSIS — Z4789 Encounter for other orthopedic aftercare: Secondary | ICD-10-CM | POA: Diagnosis not present

## 2022-06-15 DIAGNOSIS — I480 Paroxysmal atrial fibrillation: Secondary | ICD-10-CM | POA: Diagnosis not present

## 2022-06-15 DIAGNOSIS — S42352S Displaced comminuted fracture of shaft of humerus, left arm, sequela: Secondary | ICD-10-CM | POA: Diagnosis not present

## 2022-06-15 DIAGNOSIS — E039 Hypothyroidism, unspecified: Secondary | ICD-10-CM | POA: Diagnosis not present

## 2022-06-16 DIAGNOSIS — M79602 Pain in left arm: Secondary | ICD-10-CM | POA: Diagnosis not present

## 2022-06-20 DIAGNOSIS — E1149 Type 2 diabetes mellitus with other diabetic neurological complication: Secondary | ICD-10-CM | POA: Diagnosis not present

## 2022-06-20 DIAGNOSIS — M5136 Other intervertebral disc degeneration, lumbar region: Secondary | ICD-10-CM | POA: Diagnosis not present

## 2022-06-20 DIAGNOSIS — S42352S Displaced comminuted fracture of shaft of humerus, left arm, sequela: Secondary | ICD-10-CM | POA: Diagnosis not present

## 2022-06-20 DIAGNOSIS — I4821 Permanent atrial fibrillation: Secondary | ICD-10-CM | POA: Diagnosis not present

## 2022-06-22 DIAGNOSIS — S42352S Displaced comminuted fracture of shaft of humerus, left arm, sequela: Secondary | ICD-10-CM | POA: Diagnosis not present

## 2022-06-22 DIAGNOSIS — I480 Paroxysmal atrial fibrillation: Secondary | ICD-10-CM | POA: Diagnosis not present

## 2022-06-22 DIAGNOSIS — Z4789 Encounter for other orthopedic aftercare: Secondary | ICD-10-CM | POA: Diagnosis not present

## 2022-06-22 DIAGNOSIS — E039 Hypothyroidism, unspecified: Secondary | ICD-10-CM | POA: Diagnosis not present

## 2022-06-23 DIAGNOSIS — E1149 Type 2 diabetes mellitus with other diabetic neurological complication: Secondary | ICD-10-CM | POA: Diagnosis not present

## 2022-06-23 DIAGNOSIS — M542 Cervicalgia: Secondary | ICD-10-CM | POA: Diagnosis not present

## 2022-06-23 DIAGNOSIS — M5136 Other intervertebral disc degeneration, lumbar region: Secondary | ICD-10-CM | POA: Diagnosis not present

## 2022-06-23 DIAGNOSIS — I4821 Permanent atrial fibrillation: Secondary | ICD-10-CM | POA: Diagnosis not present

## 2022-06-23 DIAGNOSIS — E079 Disorder of thyroid, unspecified: Secondary | ICD-10-CM | POA: Diagnosis not present

## 2022-06-25 DIAGNOSIS — I129 Hypertensive chronic kidney disease with stage 1 through stage 4 chronic kidney disease, or unspecified chronic kidney disease: Secondary | ICD-10-CM | POA: Diagnosis not present

## 2022-06-25 DIAGNOSIS — F32A Depression, unspecified: Secondary | ICD-10-CM | POA: Diagnosis not present

## 2022-06-25 DIAGNOSIS — E1140 Type 2 diabetes mellitus with diabetic neuropathy, unspecified: Secondary | ICD-10-CM | POA: Diagnosis not present

## 2022-06-25 DIAGNOSIS — E1122 Type 2 diabetes mellitus with diabetic chronic kidney disease: Secondary | ICD-10-CM | POA: Diagnosis not present

## 2022-06-25 DIAGNOSIS — K219 Gastro-esophageal reflux disease without esophagitis: Secondary | ICD-10-CM | POA: Diagnosis not present

## 2022-06-25 DIAGNOSIS — N184 Chronic kidney disease, stage 4 (severe): Secondary | ICD-10-CM | POA: Diagnosis not present

## 2022-06-25 DIAGNOSIS — I498 Other specified cardiac arrhythmias: Secondary | ICD-10-CM | POA: Diagnosis not present

## 2022-06-25 DIAGNOSIS — I4892 Unspecified atrial flutter: Secondary | ICD-10-CM | POA: Diagnosis not present

## 2022-06-25 DIAGNOSIS — M80022D Age-related osteoporosis with current pathological fracture, left humerus, subsequent encounter for fracture with routine healing: Secondary | ICD-10-CM | POA: Diagnosis not present

## 2022-06-25 DIAGNOSIS — F419 Anxiety disorder, unspecified: Secondary | ICD-10-CM | POA: Diagnosis not present

## 2022-06-25 DIAGNOSIS — E039 Hypothyroidism, unspecified: Secondary | ICD-10-CM | POA: Diagnosis not present

## 2022-06-25 DIAGNOSIS — K760 Fatty (change of) liver, not elsewhere classified: Secondary | ICD-10-CM | POA: Diagnosis not present

## 2022-06-25 DIAGNOSIS — D631 Anemia in chronic kidney disease: Secondary | ICD-10-CM | POA: Diagnosis not present

## 2022-06-25 DIAGNOSIS — I2510 Atherosclerotic heart disease of native coronary artery without angina pectoris: Secondary | ICD-10-CM | POA: Diagnosis not present

## 2022-06-25 DIAGNOSIS — E11649 Type 2 diabetes mellitus with hypoglycemia without coma: Secondary | ICD-10-CM | POA: Diagnosis not present

## 2022-06-25 DIAGNOSIS — I4819 Other persistent atrial fibrillation: Secondary | ICD-10-CM | POA: Diagnosis not present

## 2022-06-28 DIAGNOSIS — Z23 Encounter for immunization: Secondary | ICD-10-CM | POA: Diagnosis not present

## 2022-06-28 DIAGNOSIS — I480 Paroxysmal atrial fibrillation: Secondary | ICD-10-CM | POA: Diagnosis not present

## 2022-06-28 DIAGNOSIS — E1122 Type 2 diabetes mellitus with diabetic chronic kidney disease: Secondary | ICD-10-CM | POA: Diagnosis not present

## 2022-06-28 DIAGNOSIS — S42352D Displaced comminuted fracture of shaft of humerus, left arm, subsequent encounter for fracture with routine healing: Secondary | ICD-10-CM | POA: Diagnosis not present

## 2022-06-30 DIAGNOSIS — Z794 Long term (current) use of insulin: Secondary | ICD-10-CM | POA: Diagnosis not present

## 2022-06-30 DIAGNOSIS — E1122 Type 2 diabetes mellitus with diabetic chronic kidney disease: Secondary | ICD-10-CM | POA: Diagnosis not present

## 2022-06-30 DIAGNOSIS — N183 Chronic kidney disease, stage 3 unspecified: Secondary | ICD-10-CM | POA: Diagnosis not present

## 2022-06-30 DIAGNOSIS — I480 Paroxysmal atrial fibrillation: Secondary | ICD-10-CM | POA: Diagnosis not present

## 2022-07-06 DIAGNOSIS — N184 Chronic kidney disease, stage 4 (severe): Secondary | ICD-10-CM | POA: Diagnosis not present

## 2022-07-06 DIAGNOSIS — E663 Overweight: Secondary | ICD-10-CM | POA: Diagnosis not present

## 2022-07-06 DIAGNOSIS — M054 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: Secondary | ICD-10-CM | POA: Diagnosis not present

## 2022-07-06 DIAGNOSIS — D631 Anemia in chronic kidney disease: Secondary | ICD-10-CM | POA: Diagnosis not present

## 2022-07-15 DIAGNOSIS — S42352D Displaced comminuted fracture of shaft of humerus, left arm, subsequent encounter for fracture with routine healing: Secondary | ICD-10-CM | POA: Diagnosis not present

## 2022-07-19 DIAGNOSIS — N390 Urinary tract infection, site not specified: Secondary | ICD-10-CM | POA: Diagnosis not present

## 2022-07-22 DIAGNOSIS — M8000XA Age-related osteoporosis with current pathological fracture, unspecified site, initial encounter for fracture: Secondary | ICD-10-CM | POA: Diagnosis not present

## 2022-07-22 DIAGNOSIS — Z111 Encounter for screening for respiratory tuberculosis: Secondary | ICD-10-CM | POA: Diagnosis not present

## 2022-07-22 DIAGNOSIS — I2510 Atherosclerotic heart disease of native coronary artery without angina pectoris: Secondary | ICD-10-CM | POA: Diagnosis not present

## 2022-07-22 DIAGNOSIS — J3489 Other specified disorders of nose and nasal sinuses: Secondary | ICD-10-CM | POA: Diagnosis not present

## 2022-07-22 DIAGNOSIS — Z950 Presence of cardiac pacemaker: Secondary | ICD-10-CM | POA: Diagnosis not present

## 2022-07-22 DIAGNOSIS — I480 Paroxysmal atrial fibrillation: Secondary | ICD-10-CM | POA: Diagnosis not present

## 2022-07-25 DIAGNOSIS — E039 Hypothyroidism, unspecified: Secondary | ICD-10-CM | POA: Diagnosis not present

## 2022-07-25 DIAGNOSIS — I129 Hypertensive chronic kidney disease with stage 1 through stage 4 chronic kidney disease, or unspecified chronic kidney disease: Secondary | ICD-10-CM | POA: Diagnosis not present

## 2022-07-25 DIAGNOSIS — F32A Depression, unspecified: Secondary | ICD-10-CM | POA: Diagnosis not present

## 2022-07-25 DIAGNOSIS — E11649 Type 2 diabetes mellitus with hypoglycemia without coma: Secondary | ICD-10-CM | POA: Diagnosis not present

## 2022-07-25 DIAGNOSIS — M80022D Age-related osteoporosis with current pathological fracture, left humerus, subsequent encounter for fracture with routine healing: Secondary | ICD-10-CM | POA: Diagnosis not present

## 2022-07-25 DIAGNOSIS — I498 Other specified cardiac arrhythmias: Secondary | ICD-10-CM | POA: Diagnosis not present

## 2022-07-25 DIAGNOSIS — E1122 Type 2 diabetes mellitus with diabetic chronic kidney disease: Secondary | ICD-10-CM | POA: Diagnosis not present

## 2022-07-25 DIAGNOSIS — F419 Anxiety disorder, unspecified: Secondary | ICD-10-CM | POA: Diagnosis not present

## 2022-07-25 DIAGNOSIS — E1140 Type 2 diabetes mellitus with diabetic neuropathy, unspecified: Secondary | ICD-10-CM | POA: Diagnosis not present

## 2022-07-25 DIAGNOSIS — K760 Fatty (change of) liver, not elsewhere classified: Secondary | ICD-10-CM | POA: Diagnosis not present

## 2022-07-25 DIAGNOSIS — I4892 Unspecified atrial flutter: Secondary | ICD-10-CM | POA: Diagnosis not present

## 2022-07-25 DIAGNOSIS — I4819 Other persistent atrial fibrillation: Secondary | ICD-10-CM | POA: Diagnosis not present

## 2022-07-25 DIAGNOSIS — I2510 Atherosclerotic heart disease of native coronary artery without angina pectoris: Secondary | ICD-10-CM | POA: Diagnosis not present

## 2022-07-25 DIAGNOSIS — D631 Anemia in chronic kidney disease: Secondary | ICD-10-CM | POA: Diagnosis not present

## 2022-07-25 DIAGNOSIS — K219 Gastro-esophageal reflux disease without esophagitis: Secondary | ICD-10-CM | POA: Diagnosis not present

## 2022-07-25 DIAGNOSIS — N184 Chronic kidney disease, stage 4 (severe): Secondary | ICD-10-CM | POA: Diagnosis not present

## 2022-07-28 DIAGNOSIS — R300 Dysuria: Secondary | ICD-10-CM | POA: Diagnosis not present

## 2022-08-01 DIAGNOSIS — L851 Acquired keratosis [keratoderma] palmaris et plantaris: Secondary | ICD-10-CM | POA: Diagnosis not present

## 2022-08-01 DIAGNOSIS — E1142 Type 2 diabetes mellitus with diabetic polyneuropathy: Secondary | ICD-10-CM | POA: Diagnosis not present

## 2022-08-01 DIAGNOSIS — B351 Tinea unguium: Secondary | ICD-10-CM | POA: Diagnosis not present

## 2022-08-08 DIAGNOSIS — E1121 Type 2 diabetes mellitus with diabetic nephropathy: Secondary | ICD-10-CM | POA: Diagnosis not present

## 2022-08-08 DIAGNOSIS — I129 Hypertensive chronic kidney disease with stage 1 through stage 4 chronic kidney disease, or unspecified chronic kidney disease: Secondary | ICD-10-CM | POA: Diagnosis not present

## 2022-08-08 DIAGNOSIS — N1831 Chronic kidney disease, stage 3a: Secondary | ICD-10-CM | POA: Diagnosis not present

## 2022-08-08 DIAGNOSIS — E1165 Type 2 diabetes mellitus with hyperglycemia: Secondary | ICD-10-CM | POA: Diagnosis not present

## 2022-08-08 DIAGNOSIS — K5900 Constipation, unspecified: Secondary | ICD-10-CM | POA: Diagnosis not present

## 2022-08-08 DIAGNOSIS — I158 Other secondary hypertension: Secondary | ICD-10-CM | POA: Diagnosis not present

## 2022-08-11 DIAGNOSIS — E1165 Type 2 diabetes mellitus with hyperglycemia: Secondary | ICD-10-CM | POA: Diagnosis not present

## 2022-08-11 DIAGNOSIS — I129 Hypertensive chronic kidney disease with stage 1 through stage 4 chronic kidney disease, or unspecified chronic kidney disease: Secondary | ICD-10-CM | POA: Diagnosis not present

## 2022-08-15 DIAGNOSIS — N1831 Chronic kidney disease, stage 3a: Secondary | ICD-10-CM | POA: Diagnosis not present

## 2022-08-15 DIAGNOSIS — E1165 Type 2 diabetes mellitus with hyperglycemia: Secondary | ICD-10-CM | POA: Diagnosis not present

## 2022-08-15 DIAGNOSIS — E1121 Type 2 diabetes mellitus with diabetic nephropathy: Secondary | ICD-10-CM | POA: Diagnosis not present

## 2022-08-15 DIAGNOSIS — S42352D Displaced comminuted fracture of shaft of humerus, left arm, subsequent encounter for fracture with routine healing: Secondary | ICD-10-CM | POA: Diagnosis not present

## 2022-08-22 DIAGNOSIS — I440 Atrioventricular block, first degree: Secondary | ICD-10-CM | POA: Diagnosis not present

## 2022-08-22 DIAGNOSIS — E1165 Type 2 diabetes mellitus with hyperglycemia: Secondary | ICD-10-CM | POA: Diagnosis not present

## 2022-08-22 DIAGNOSIS — R9431 Abnormal electrocardiogram [ECG] [EKG]: Secondary | ICD-10-CM | POA: Diagnosis not present

## 2022-08-22 DIAGNOSIS — R1310 Dysphagia, unspecified: Secondary | ICD-10-CM | POA: Diagnosis not present

## 2022-08-22 DIAGNOSIS — K219 Gastro-esophageal reflux disease without esophagitis: Secondary | ICD-10-CM | POA: Diagnosis not present

## 2022-08-26 DIAGNOSIS — I130 Hypertensive heart and chronic kidney disease with heart failure and stage 1 through stage 4 chronic kidney disease, or unspecified chronic kidney disease: Secondary | ICD-10-CM | POA: Diagnosis not present

## 2022-08-26 DIAGNOSIS — N3281 Overactive bladder: Secondary | ICD-10-CM | POA: Diagnosis not present

## 2022-08-26 DIAGNOSIS — F32A Depression, unspecified: Secondary | ICD-10-CM | POA: Diagnosis not present

## 2022-08-26 DIAGNOSIS — I2510 Atherosclerotic heart disease of native coronary artery without angina pectoris: Secondary | ICD-10-CM | POA: Diagnosis not present

## 2022-08-26 DIAGNOSIS — K219 Gastro-esophageal reflux disease without esophagitis: Secondary | ICD-10-CM | POA: Diagnosis not present

## 2022-08-26 DIAGNOSIS — N1831 Chronic kidney disease, stage 3a: Secondary | ICD-10-CM | POA: Diagnosis not present

## 2022-08-26 DIAGNOSIS — M5030 Other cervical disc degeneration, unspecified cervical region: Secondary | ICD-10-CM | POA: Diagnosis not present

## 2022-08-26 DIAGNOSIS — F410 Panic disorder [episodic paroxysmal anxiety] without agoraphobia: Secondary | ICD-10-CM | POA: Diagnosis not present

## 2022-08-26 DIAGNOSIS — J301 Allergic rhinitis due to pollen: Secondary | ICD-10-CM | POA: Diagnosis not present

## 2022-08-26 DIAGNOSIS — M1990 Unspecified osteoarthritis, unspecified site: Secondary | ICD-10-CM | POA: Diagnosis not present

## 2022-08-26 DIAGNOSIS — I480 Paroxysmal atrial fibrillation: Secondary | ICD-10-CM | POA: Diagnosis not present

## 2022-08-26 DIAGNOSIS — I5030 Unspecified diastolic (congestive) heart failure: Secondary | ICD-10-CM | POA: Diagnosis not present

## 2022-08-26 DIAGNOSIS — M5136 Other intervertebral disc degeneration, lumbar region: Secondary | ICD-10-CM | POA: Diagnosis not present

## 2022-08-26 DIAGNOSIS — E1122 Type 2 diabetes mellitus with diabetic chronic kidney disease: Secondary | ICD-10-CM | POA: Diagnosis not present

## 2022-08-26 DIAGNOSIS — E7800 Pure hypercholesterolemia, unspecified: Secondary | ICD-10-CM | POA: Diagnosis not present

## 2022-08-26 DIAGNOSIS — R1310 Dysphagia, unspecified: Secondary | ICD-10-CM | POA: Diagnosis not present

## 2022-09-07 DIAGNOSIS — K219 Gastro-esophageal reflux disease without esophagitis: Secondary | ICD-10-CM | POA: Diagnosis not present

## 2022-09-07 DIAGNOSIS — R1310 Dysphagia, unspecified: Secondary | ICD-10-CM | POA: Diagnosis not present

## 2022-09-07 DIAGNOSIS — I130 Hypertensive heart and chronic kidney disease with heart failure and stage 1 through stage 4 chronic kidney disease, or unspecified chronic kidney disease: Secondary | ICD-10-CM | POA: Diagnosis not present

## 2022-09-08 DIAGNOSIS — F419 Anxiety disorder, unspecified: Secondary | ICD-10-CM | POA: Diagnosis not present

## 2022-09-08 DIAGNOSIS — F329 Major depressive disorder, single episode, unspecified: Secondary | ICD-10-CM | POA: Diagnosis not present

## 2022-09-12 DIAGNOSIS — E785 Hyperlipidemia, unspecified: Secondary | ICD-10-CM | POA: Diagnosis not present

## 2022-09-12 DIAGNOSIS — R498 Other voice and resonance disorders: Secondary | ICD-10-CM | POA: Diagnosis not present

## 2022-09-12 DIAGNOSIS — E663 Overweight: Secondary | ICD-10-CM | POA: Diagnosis not present

## 2022-09-12 DIAGNOSIS — R300 Dysuria: Secondary | ICD-10-CM | POA: Diagnosis not present

## 2022-09-19 DIAGNOSIS — E1122 Type 2 diabetes mellitus with diabetic chronic kidney disease: Secondary | ICD-10-CM | POA: Diagnosis not present

## 2022-09-19 DIAGNOSIS — J069 Acute upper respiratory infection, unspecified: Secondary | ICD-10-CM | POA: Diagnosis not present

## 2022-09-19 DIAGNOSIS — N390 Urinary tract infection, site not specified: Secondary | ICD-10-CM | POA: Diagnosis not present

## 2022-09-19 DIAGNOSIS — N184 Chronic kidney disease, stage 4 (severe): Secondary | ICD-10-CM | POA: Diagnosis not present

## 2022-09-26 DIAGNOSIS — N390 Urinary tract infection, site not specified: Secondary | ICD-10-CM | POA: Diagnosis not present

## 2022-09-26 DIAGNOSIS — K219 Gastro-esophageal reflux disease without esophagitis: Secondary | ICD-10-CM | POA: Diagnosis not present

## 2022-09-26 DIAGNOSIS — N184 Chronic kidney disease, stage 4 (severe): Secondary | ICD-10-CM | POA: Diagnosis not present

## 2022-09-30 DIAGNOSIS — M75122 Complete rotator cuff tear or rupture of left shoulder, not specified as traumatic: Secondary | ICD-10-CM | POA: Diagnosis not present

## 2022-09-30 DIAGNOSIS — S62525D Nondisplaced fracture of distal phalanx of left thumb, subsequent encounter for fracture with routine healing: Secondary | ICD-10-CM | POA: Diagnosis not present

## 2022-09-30 DIAGNOSIS — S42302D Unspecified fracture of shaft of humerus, left arm, subsequent encounter for fracture with routine healing: Secondary | ICD-10-CM | POA: Diagnosis not present

## 2022-10-03 DIAGNOSIS — M109 Gout, unspecified: Secondary | ICD-10-CM | POA: Diagnosis not present

## 2022-10-03 DIAGNOSIS — E1122 Type 2 diabetes mellitus with diabetic chronic kidney disease: Secondary | ICD-10-CM | POA: Diagnosis not present

## 2022-10-03 DIAGNOSIS — N184 Chronic kidney disease, stage 4 (severe): Secondary | ICD-10-CM | POA: Diagnosis not present

## 2022-10-03 DIAGNOSIS — N390 Urinary tract infection, site not specified: Secondary | ICD-10-CM | POA: Diagnosis not present

## 2022-10-10 DIAGNOSIS — N184 Chronic kidney disease, stage 4 (severe): Secondary | ICD-10-CM | POA: Diagnosis not present

## 2022-10-10 DIAGNOSIS — F329 Major depressive disorder, single episode, unspecified: Secondary | ICD-10-CM | POA: Diagnosis not present

## 2022-10-10 DIAGNOSIS — N3281 Overactive bladder: Secondary | ICD-10-CM | POA: Diagnosis not present

## 2022-10-10 DIAGNOSIS — E785 Hyperlipidemia, unspecified: Secondary | ICD-10-CM | POA: Diagnosis not present

## 2022-10-11 DIAGNOSIS — F419 Anxiety disorder, unspecified: Secondary | ICD-10-CM | POA: Diagnosis not present

## 2022-10-11 DIAGNOSIS — F329 Major depressive disorder, single episode, unspecified: Secondary | ICD-10-CM | POA: Diagnosis not present

## 2022-10-17 DIAGNOSIS — R32 Unspecified urinary incontinence: Secondary | ICD-10-CM

## 2022-10-17 DIAGNOSIS — F419 Anxiety disorder, unspecified: Secondary | ICD-10-CM | POA: Diagnosis not present

## 2022-10-17 DIAGNOSIS — M545 Low back pain, unspecified: Secondary | ICD-10-CM | POA: Diagnosis not present

## 2022-10-17 DIAGNOSIS — R350 Frequency of micturition: Secondary | ICD-10-CM | POA: Diagnosis not present

## 2022-10-17 HISTORY — DX: Unspecified urinary incontinence: R32

## 2022-10-31 DIAGNOSIS — R0602 Shortness of breath: Secondary | ICD-10-CM | POA: Diagnosis not present

## 2022-10-31 DIAGNOSIS — R062 Wheezing: Secondary | ICD-10-CM | POA: Diagnosis not present

## 2022-10-31 DIAGNOSIS — J069 Acute upper respiratory infection, unspecified: Secondary | ICD-10-CM | POA: Diagnosis not present

## 2022-10-31 DIAGNOSIS — R051 Acute cough: Secondary | ICD-10-CM | POA: Diagnosis not present

## 2022-11-07 DIAGNOSIS — F5101 Primary insomnia: Secondary | ICD-10-CM | POA: Diagnosis not present

## 2022-11-07 DIAGNOSIS — I129 Hypertensive chronic kidney disease with stage 1 through stage 4 chronic kidney disease, or unspecified chronic kidney disease: Secondary | ICD-10-CM | POA: Diagnosis not present

## 2022-11-07 DIAGNOSIS — Z0189 Encounter for other specified special examinations: Secondary | ICD-10-CM | POA: Diagnosis not present

## 2022-11-07 DIAGNOSIS — N184 Chronic kidney disease, stage 4 (severe): Secondary | ICD-10-CM | POA: Diagnosis not present

## 2022-11-08 DIAGNOSIS — E039 Hypothyroidism, unspecified: Secondary | ICD-10-CM | POA: Diagnosis not present

## 2022-11-08 DIAGNOSIS — F329 Major depressive disorder, single episode, unspecified: Secondary | ICD-10-CM | POA: Diagnosis not present

## 2022-11-09 DIAGNOSIS — R2689 Other abnormalities of gait and mobility: Secondary | ICD-10-CM | POA: Diagnosis not present

## 2022-11-09 DIAGNOSIS — M79674 Pain in right toe(s): Secondary | ICD-10-CM | POA: Diagnosis not present

## 2022-11-09 DIAGNOSIS — M79675 Pain in left toe(s): Secondary | ICD-10-CM | POA: Diagnosis not present

## 2022-11-09 DIAGNOSIS — E1142 Type 2 diabetes mellitus with diabetic polyneuropathy: Secondary | ICD-10-CM | POA: Diagnosis not present

## 2022-11-09 DIAGNOSIS — L851 Acquired keratosis [keratoderma] palmaris et plantaris: Secondary | ICD-10-CM | POA: Diagnosis not present

## 2022-11-09 DIAGNOSIS — L603 Nail dystrophy: Secondary | ICD-10-CM | POA: Diagnosis not present

## 2022-11-09 DIAGNOSIS — B351 Tinea unguium: Secondary | ICD-10-CM | POA: Diagnosis not present

## 2022-12-05 DIAGNOSIS — K219 Gastro-esophageal reflux disease without esophagitis: Secondary | ICD-10-CM | POA: Diagnosis not present

## 2022-12-05 DIAGNOSIS — E039 Hypothyroidism, unspecified: Secondary | ICD-10-CM | POA: Diagnosis not present

## 2022-12-05 DIAGNOSIS — K5900 Constipation, unspecified: Secondary | ICD-10-CM | POA: Diagnosis not present

## 2022-12-05 DIAGNOSIS — E1122 Type 2 diabetes mellitus with diabetic chronic kidney disease: Secondary | ICD-10-CM | POA: Diagnosis not present

## 2022-12-19 DIAGNOSIS — F331 Major depressive disorder, recurrent, moderate: Secondary | ICD-10-CM | POA: Diagnosis not present

## 2022-12-19 DIAGNOSIS — R32 Unspecified urinary incontinence: Secondary | ICD-10-CM | POA: Diagnosis not present

## 2022-12-19 DIAGNOSIS — K5900 Constipation, unspecified: Secondary | ICD-10-CM | POA: Diagnosis not present

## 2022-12-19 DIAGNOSIS — K219 Gastro-esophageal reflux disease without esophagitis: Secondary | ICD-10-CM | POA: Diagnosis not present

## 2023-01-01 DIAGNOSIS — I5032 Chronic diastolic (congestive) heart failure: Secondary | ICD-10-CM | POA: Insufficient documentation

## 2023-01-01 DIAGNOSIS — I5030 Unspecified diastolic (congestive) heart failure: Secondary | ICD-10-CM | POA: Insufficient documentation

## 2023-01-01 NOTE — Progress Notes (Unsigned)
Cardiology Office Note Date:  01/03/2023  Patient ID:  Deborah Obrien, Deborah Obrien 1940-10-08, MRN [id number] PCP:  Lauro Regulus, MD  Cardiologist:  Sherryl Manges, MD Electrophysiologist: None     Chief Complaint: 6mon follow-up, past due  History of Present Illness: Deborah Obrien is a 82 y.o. female with PMH notable for parox Afib/flutter, SND s/p PPM, HFpEF, CAD, ; seen today for Sherryl Manges, MD for routine electrophysiology followup.  She last saw Dr. Graciela Husbands 03/2022 for add-on visit d/t increased lower extremity edema. Device functioning well, inc lasix 20 > 40 daily. 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 Required multiple surgeries to L arm. Ultimately resulted in need to tx to assisted living facility. She is slowly acclimating to new living situation. Becoming more involved in activities at the facility. Her husband, unfortunately, resides in a separate facility and this is distressing to her.  She has lost considerable amount of weight over the past several years. Previously was trying to loose, but now is not trying and still losing weight. She does not particularly enjoy the food at her facility  Breathing is stable, knows that if she exerts herself she will become SOB. Not new, not any worse.  No bleeding concerns.      she denies chest pain, palpitations, dyspnea, PND, orthopnea, nausea, vomiting, dizziness, syncope, edema, weight gain, or early satiety.    Device Information: MDT dual chamber PPM Was originally Single chamber > added RA lead 08/2017 resulting in tension > RA lead revision  AAD History: Amiodarone   Past Medical History:  Diagnosis Date   A-fib 2016   Acid reflux    Acute respiratory failure 04/09/2018   Anemia    Anxiety    Atrial flutter 08/24/2017   Bowel obstruction    Carotid artery disease    Chronic kidney disease (CKD) stage G3a/A1, moderately decreased glomerular filtration rate (GFR) between  45-59 mL/min/1.73 square meter and albuminuria creatinine ratio less than 30 mg/g    stage IV   Closed fracture of one rib of right side 05/29/2020   Complete tear of left rotator cuff 10/16/2014   Coronary artery disease    Depression    Diabetes mellitus without complication    Diarrhea 03/04/2022   Dyspnea    Dysrhythmia    PAROXYSMAL ATRIAL FIBRILLATION   Fatty liver    Gastritis without bleeding    GI bleed    Gout    Hypertension    Hypothyroidism    Low back pain    history of kyphoplasty t12-l1   Morbid obesity    Multiple gastric polyps    history of gi bleed.  polyps removed   Neuromuscular disorder    diabetic neuropathy   Osteoarthritis    Osteoporosis    Pacemaker 04/09/2018   MDT- upgraded to duel chamber device Dec 2018- lead revision post upgrade dec 2018   Paroxysmal atrial fibrillation    Presence of permanent cardiac pacemaker    Sinus node dysfunction 08/24/2017   Stroke    TIA, patient unaware that she had it. 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 plate & screws    BACK SURGERY  2019   patient describes kyphoplasty for compression fractures, MD referral said fusion   BREAST BIOPSY Right 08/16   stereo fibroadenomatous change, neg for atypia   BREAST EXCISIONAL BIOPSY Left    neg   CARDIAC CATHETERIZATION Left 08/25/2016   Procedure: Left Heart Cath and Coronary Angiography;  Surgeon: Lamar Blinks, MD;  Location: [facility] INVASIVE CV LAB;  Service: Cardiovascular;  Laterality: Left;   CARDIOVERSION N/A 06/28/2017   Procedure: CARDIOVERSION;  Surgeon: Lamar Blinks, MD;   Location: [facility] ORS;  Service: Cardiovascular;  Laterality: N/A;   CARDIOVERSION N/A 02/06/2018   Procedure: CARDIOVERSION;  Surgeon: Wendall Stade, MD;  Location: [facility] ENDOSCOPY;  Service: Cardiovascular;  Laterality: N/A;   CARDIOVERSION N/A 07/15/2020   Procedure: CARDIOVERSION;  Surgeon: Jake Bathe, MD;  Location: [facility] ENDOSCOPY;  Service: Cardiovascular;  Laterality: N/A;   CATARACT EXTRACTION, BILATERAL  2012   CHOLECYSTECTOMY     colon blockage  1999   COLON SURGERY  2000   removed 20% of colon. colon had collapsed   COLONOSCOPY  2016   polyps removed 2016   DORSAL COMPARTMENT RELEASE Left 09/14/2016   Procedure: RELEASE DORSAL COMPARTMENT (DEQUERVAIN);  Surgeon: Donato Heinz, MD;  Location: [facility] ORS;  Service: Orthopedics;  Laterality: Left;   ESOPHAGOGASTRODUODENOSCOPY N/A 01/27/2015   Procedure: ESOPHAGOGASTRODUODENOSCOPY (EGD);  Surgeon: Christena Deem, MD;  Location: [facility] ENDOSCOPY;  Service: Endoscopy;  Laterality: N/A;   ESOPHAGOGASTRODUODENOSCOPY (EGD) WITH PROPOFOL N/A 07/24/2015   Procedure: ESOPHAGOGASTRODUODENOSCOPY (EGD) WITH PROPOFOL;  Surgeon: Midge Minium, MD;  Location: [facility] ENDOSCOPY;  Service: Endoscopy;  Laterality: N/A;   ESOPHAGOGASTRODUODENOSCOPY (EGD) WITH PROPOFOL N/A 04/12/2018   Procedure: ESOPHAGOGASTRODUODENOSCOPY (EGD) WITH PROPOFOL;  Surgeon: Wyline Mood, MD;  Location: [facility] ENDOSCOPY;  Service: Gastroenterology;  Laterality: N/A;   ESOPHAGOGASTRODUODENOSCOPY (EGD) WITH PROPOFOL N/A 06/22/2018   Procedure: ESOPHAGOGASTRODUODENOSCOPY (EGD) WITH PROPOFOL;  Surgeon: Rachael Fee, MD;  Location: [facility] ENDOSCOPY;  Service: Endoscopy;  Laterality: N/A;   ESOPHAGOGASTRODUODENOSCOPY (EGD) WITH PROPOFOL N/A 07/09/2018   Procedure: ESOPHAGOGASTRODUODENOSCOPY (EGD) WITH PROPOFOL with resection of gastric polyps;  Surgeon: Wyline Mood, MD;  Location: [facility] ENDOSCOPY;  Service: Gastroenterology;  Laterality: N/A;   ESOPHAGOGASTRODUODENOSCOPY (EGD) WITH PROPOFOL N/A  05/17/2019   Procedure: ESOPHAGOGASTRODUODENOSCOPY (EGD) WITH PROPOFOL;  Surgeon: Wyline Mood, MD;  Location: [facility] ENDOSCOPY;  Service: Gastroenterology;  Laterality: N/A;   EUS N/A 06/22/2018   Procedure: UPPER ENDOSCOPIC ULTRASOUND (EUS) RADIAL;  Surgeon: Rachael Fee, MD;  Location: [facility] ENDOSCOPY;  Service: Endoscopy;  Laterality: N/A;   EYE SURGERY Bilateral    cataract extractions   HUMERUS IM NAIL Left 06/02/2022   Procedure: INTRAMEDULLARY (IM) NAIL HUMERAL;  Surgeon: Christena Flake, MD;  Location: [facility] ORS;  Service: Orthopedics;  Laterality: Left;   JOINT REPLACEMENT Bilateral 2014   Bilateral Knee replacement   LEAD REVISION/REPAIR N/A 08/29/2017   Procedure: LEAD REVISION/REPAIR;  Surgeon: Regan Lemming, MD;  Location: [facility] INVASIVE CV LAB;  Service: Cardiovascular;  Laterality: N/A;   OOPHORECTOMY     PACEMAKER INSERTION Left 07/01/2015   Procedure: INSERTION PACEMAKER;  Surgeon: Marcina Millard, MD;  Location: [facility] ORS;  Service: Cardiovascular;  Laterality: Left;   PACEMAKER REVISION N/A 08/28/2017   Procedure: PACEMAKER REVISION;  Surgeon: Duke Salvia, MD;  Location: [facility] INVASIVE CV LAB;  Service: Cardiovascular;  Laterality: N/A;   REPLACEMENT TOTAL KNEE BILATERAL  SHOULDER ARTHROSCOPY WITH ROTATOR CUFF REPAIR AND OPEN BICEPS TENODESIS Left 11/21/2019   Procedure: LEFT SHOULDER ARTHROSCOPY WITH DEBRIDEMENT, DECOMPRESSION, ROTATOR CUFF REPAIR AND BICEPS TENOLYSIS;  Surgeon: Christena Flake, MD;  Location: [facility] ORS;  Service: Orthopedics;  Laterality: Left;   SHOULDER ARTHROSCOPY WITH SUBACROMIAL DECOMPRESSION Left 2013   TEE WITHOUT CARDIOVERSION N/A 06/28/2017   Procedure: TRANSESOPHAGEAL ECHOCARDIOGRAM (TEE);  Surgeon: Lamar Blinks, MD;  Location: [facility] ORS;  Service: Cardiovascular;  Laterality: N/A;    Current Outpatient Medications  Medication Instructions   acetaminophen (TYLENOL) 650 mg, Oral, Every 6 hours PRN   acetaminophen (TYLENOL) 500 mg, Oral, 3  times daily   albuterol (VENTOLIN HFA) 108 (90 Base) MCG/ACT inhaler 2 puffs, Inhalation, Every 6 hours PRN, Ventolin   allopurinol (ZYLOPRIM) 100 mg, Oral, Daily   amiodarone (PACERONE) 200 MG tablet TAKE 1 TABLET BY MOUTH ONCE DAILY . APPOINTMENT REQUIRED FOR FUTURE REFILLS   apixaban (ELIQUIS) 2.5 mg, Oral, 2 times daily   cyanocobalamin (VITAMIN B12) 1,000 mcg, Oral, Daily   docusate sodium (COLACE) 200 mg, Oral, 2 times daily   famotidine (PEPCID) 10 mg, Oral, Daily   Fluticasone Furoate (FLONASE SENSIMIST NA) 1 spray, Nasal, Daily   furosemide (LASIX) 40 mg, Oral, 2 times daily   gabapentin (NEURONTIN) 200 mg, Oral, 2 times daily   HumaLOG 0-12 Units, Subcutaneous, 3 times daily with meals, Sliding scale   Lantus 16 Units, Daily at bedtime   levothyroxine (SYNTHROID) 50 mcg, Oral, Daily before breakfast   oxybutynin (DITROPAN) 5 mg, Oral, 3 times daily, Breakfast & bedtime   Polyethyl Glycol-Propyl Glycol 0.4-0.3 % SOLN 1-2 drops, Both Eyes, 4 times daily PRN   pravastatin (PRAVACHOL) 20 mg, Oral, Daily at bedtime   Prenat-FeCbn-FeBisg-FA-Omega (MULTIVITAMIN/MINERALS PO) 1 tablet, Oral, Daily   rOPINIRole (REQUIP) 0.25 mg, Oral, Daily at bedtime   senna (SENOKOT) 8.6 MG TABS tablet 1 tablet, Oral, Every M-W-F   sertraline (ZOLOFT) 50 mg, Oral, Daily   sertraline (ZOLOFT) 25 mg, Oral, Daily   Simethicone LIQD 10 mLs, Does not apply, 3 times daily PRN, 200-200-20 mg/5 mL   talc powder 1 application , Topical, As needed   traZODone (DESYREL) 50 mg, Oral, Daily at bedtime    Social History:  The patient  reports that she has never smoked. She has never used smokeless tobacco. She reports that she does not drink alcohol and does not use drugs.   Family History:  The patient's family history includes CAD in her mother; Cancer in her father; Diabetes Mellitus II in her brother and mother; Heart attack in her mother; Stroke in her brother.  ROS:  Please see the history of present  illness. All other systems are reviewed and otherwise negative.   PHYSICAL EXAM:  VS:  BP 110/70 (BP Location: Left Arm, Patient Position: Sitting, Cuff Size: Normal)   Pulse 81   Ht 5\' 3"  (1.6 m)   Wt 151 lb (68.5 kg)   SpO2 99%   BMI 26.75 kg/m  BMI: Body mass index is 26.75 kg/m.  GEN- The patient is well appearing, alert and oriented x 3 today.   Lungs- Clear to ausculation bilaterally, normal work of breathing.  Heart- Regular rate and rhythm, no murmurs, rubs or gallops Extremities- Trace peripheral edema, warm, dry Skin-   device pocket well-healed   Device interrogation done today and reviewed by myself:  Battery good Lead thresholds, impedence, sensing stable  Rare, Afib episodes, particularly high up-tick in episodes at end of  Sept 2023 3h AF episode #467 resulting in 135 ATP No changes made today  EKG is ordered. Personal review of EKG from today shows: A-paced, 1st deg HB, rate 81bpm  Recent Labs: 03/05/2022: TSH 2.183 06/08/2022: ALT 10; BUN 24; Creatinine, Ser 0.95; Potassium 4.6; Sodium 141 01/03/2023: Hemoglobin 13.4; Platelets 176  No results found for requested labs within last 365 days.   CrCl cannot be calculated (Patient's most recent lab result is older than the maximum 21 days allowed.).   Wt Readings from Last 3 Encounters:  01/03/23 151 lb (68.5 kg)  05/30/22 173 lb 11.6 oz (78.8 kg)  03/29/22 171 lb 4 oz (77.7 kg)     Additional studies reviewed include: Previous EP, cardiology notes.   TTE, 04/10/2018 - Left ventricle: The cavity size was normal. Wall thickness was normal. Systolic function was normal. The estimated ejection fraction was in the range of 60% to 65%.  - Mitral valve: Moderately thickened, mildly calcified leaflets .  - Left atrium: The atrium was mildly dilated.  - Right ventricle: The cavity size was mildly to moderately dilated. Wall thickness was mildly increased.  - Right atrium: The atrium was mildly dilated.  - Tricuspid  valve: There was moderate regurgitation.  - Pulmonary arteries: Systolic pressure was moderately increased,    estimated to be 50 mm Hg plus central venous/right atrial pressure.   ASSESSMENT AND PLAN:  #) SND s/p MDT dual chamber PPM Device functioning well, see paceart   #) parox Afib Tolerating amiodarone  daily Increase in AF episodes correlate to L arm injury, hospitalization, and surgeries. Has had no AF since  - update amio labs today including CMP, TSH, T4 CHA2DS2-VASc Score = 8 [CHF History: 0, HTN History: 1, Diabetes History: 1, Stroke History: 2, Vascular Disease History: 1, Age Score: 2, Gender Score: 1].  Therefore, the patient's annual risk of stroke is 10.8 %.  OAC - eliquis 2.5mg  daily - updating labs to confirm dose reduction   #) HFpEF Euvolemic on exam Warm and dry NYHA class II symptoms   Current medicines are reviewed at length with the patient today.   The patient does not have concerns regarding her medicines.  The following changes were made today:  none  Labs/ tests ordered today include:  Orders Placed This Encounter  Procedures   TSH   Comp Met (CMET)   T4, free   CBC   EKG 12-Lead     Disposition: Follow up with EP APP in in 6 months   Signed, Sherie Don, NP  01/03/23  11:32 AM  Electrophysiology [facility]

## 2023-01-02 DIAGNOSIS — E663 Overweight: Secondary | ICD-10-CM | POA: Diagnosis not present

## 2023-01-02 DIAGNOSIS — R269 Unspecified abnormalities of gait and mobility: Secondary | ICD-10-CM | POA: Diagnosis not present

## 2023-01-02 DIAGNOSIS — J309 Allergic rhinitis, unspecified: Secondary | ICD-10-CM | POA: Diagnosis not present

## 2023-01-02 DIAGNOSIS — K5900 Constipation, unspecified: Secondary | ICD-10-CM | POA: Diagnosis not present

## 2023-01-03 ENCOUNTER — Other Ambulatory Visit
Admission: RE | Admit: 2023-01-03 | Discharge: 2023-01-03 | Disposition: A | Discharge status: Home / Self Care | Payer: Medicare Other | Attending: Internal Medicine | Admitting: Internal Medicine

## 2023-01-03 ENCOUNTER — Encounter: Payer: Self-pay | Admitting: Cardiology

## 2023-01-03 ENCOUNTER — Ambulatory Visit: Payer: Medicare Other | Attending: Internal Medicine | Admitting: Cardiology

## 2023-01-03 ENCOUNTER — Ambulatory Visit: Payer: Medicare Other | Admitting: Internal Medicine

## 2023-01-03 VITALS — BP 110/70 | HR 81 | Ht 63.0 in | Wt 151.0 lb

## 2023-01-03 DIAGNOSIS — I4821 Permanent atrial fibrillation: Secondary | ICD-10-CM | POA: Diagnosis not present

## 2023-01-03 DIAGNOSIS — Z950 Presence of cardiac pacemaker: Secondary | ICD-10-CM | POA: Diagnosis not present

## 2023-01-03 DIAGNOSIS — I495 Sick sinus syndrome: Secondary | ICD-10-CM | POA: Diagnosis not present

## 2023-01-03 DIAGNOSIS — I480 Paroxysmal atrial fibrillation: Secondary | ICD-10-CM | POA: Diagnosis not present

## 2023-01-03 DIAGNOSIS — Z79899 Other long term (current) drug therapy: Secondary | ICD-10-CM | POA: Insufficient documentation

## 2023-01-03 DIAGNOSIS — I5032 Chronic diastolic (congestive) heart failure: Secondary | ICD-10-CM

## 2023-01-03 LAB — CBC
HCT: 41.2 % (ref 36.0–46.0)
Hemoglobin: 13.4 g/dL (ref 12.0–15.0)
MCH: 31.7 pg (ref 26.0–34.0)
MCHC: 32.5 g/dL (ref 30.0–36.0)
MCV: 97.4 fL (ref 80.0–100.0)
Platelets: 176 10*3/uL (ref 150–400)
RBC: 4.23 MIL/uL (ref 3.87–5.11)
RDW: 13.6 % (ref 11.5–15.5)
WBC: 6.2 10*3/uL (ref 4.0–10.5)
nRBC: 0 % (ref 0.0–0.2)

## 2023-01-03 LAB — COMPREHENSIVE METABOLIC PANEL
ALT: 31 U/L (ref 0–44)
AST: 48 U/L — ABNORMAL HIGH (ref 15–41)
Albumin: 4 g/dL (ref 3.5–5.0)
Alkaline Phosphatase: 160 U/L — ABNORMAL HIGH (ref 38–126)
Anion gap: 12 (ref 5–15)
BUN: 56 mg/dL — ABNORMAL HIGH (ref 8–23)
CO2: 28 mmol/L (ref 22–32)
Calcium: 9 mg/dL (ref 8.9–10.3)
Chloride: 100 mmol/L (ref 98–111)
Creatinine, Ser: 2.12 mg/dL — ABNORMAL HIGH (ref 0.44–1.00)
GFR, Estimated: 23 mL/min — ABNORMAL LOW (ref >60)
Glucose, Bld: 130 mg/dL — ABNORMAL HIGH (ref 70–99)
Potassium: 3.9 mmol/L (ref 3.5–5.1)
Sodium: 140 mmol/L (ref 135–145)
Total Bilirubin: 1.3 mg/dL — ABNORMAL HIGH (ref 0.3–1.2)
Total Protein: 7.1 g/dL (ref 6.5–8.1)

## 2023-01-03 LAB — CUP PACEART INCLINIC DEVICE CHECK
Date Time Interrogation Session: 20240423115348
Implantable Lead Connection Status: 753985
Implantable Lead Connection Status: 753985
Implantable Lead Implant Date: 20161019
Implantable Lead Implant Date: 20181217
Implantable Lead Location: 753859
Implantable Lead Location: 753860
Implantable Lead Model: 5076
Implantable Lead Model: 5076
Implantable Pulse Generator Implant Date: 20181217

## 2023-01-03 LAB — TSH: TSH: 3.625 u[IU]/mL (ref 0.350–4.500)

## 2023-01-03 LAB — T4, FREE: Free T4: 1.17 ng/dL — ABNORMAL HIGH (ref 0.61–1.12)

## 2023-01-03 NOTE — Patient Instructions (Signed)
Medication Instructions:  Your physician recommends that you continue on your current medications as directed. Please refer to the Current Medication list given to you today.  *If you need a refill on your cardiac medications before your next appointment, please call your pharmacy*   Lab Work: CMP, CBC, TSH, and Free T4 - Please go to the [facility]. You will check in at the front desk to the right as you walk into the atrium. Valet Parking is offered if needed. - No appointment needed. You may go any day between 7 am and 6 pm.  If you have labs (blood work) drawn today and your tests are completely normal, you will receive your results only by: MyChart Message (if you have MyChart) OR A paper copy in the mail If you have any lab test that is abnormal or we need to change your treatment, we will call you to review the results.   Testing/Procedures: No testing ordered  Follow-Up: At [facility], you and your health needs are our priority.  As part of our continuing mission to provide you with exceptional heart care, we have created designated Provider Care Teams.  These Care Teams include your primary Cardiologist (physician) and Advanced Practice Providers (APPs -  Physician Assistants and Nurse Practitioners) who all work together to provide you with the care you need, when you need it.  We recommend signing up for the patient portal called "MyChart".  Sign up information is provided on this After Visit Summary.  MyChart is used to connect with patients for Virtual Visits (Telemedicine).  Patients are able to view lab/test results, encounter notes, upcoming appointments, etc.  Non-urgent messages can be sent to your provider as well.   To learn more about what you can do with MyChart, go to [email or web address].    Your next appointment:   6 month(s)  Provider:   Sherie Don, NP

## 2023-01-04 ENCOUNTER — Other Ambulatory Visit: Payer: Self-pay

## 2023-01-04 DIAGNOSIS — S72001A Fracture of unspecified part of neck of right femur, initial encounter for closed fracture: Secondary | ICD-10-CM | POA: Diagnosis not present

## 2023-01-04 DIAGNOSIS — H905 Unspecified sensorineural hearing loss: Secondary | ICD-10-CM | POA: Diagnosis not present

## 2023-01-05 DIAGNOSIS — N2581 Secondary hyperparathyroidism of renal origin: Secondary | ICD-10-CM | POA: Diagnosis not present

## 2023-01-05 DIAGNOSIS — I10 Essential (primary) hypertension: Secondary | ICD-10-CM | POA: Diagnosis not present

## 2023-01-05 DIAGNOSIS — N179 Acute kidney failure, unspecified: Secondary | ICD-10-CM | POA: Diagnosis not present

## 2023-01-05 MED ORDER — LEVOTHYROXINE SODIUM 37.5 MCG PO CAPS: 37.5000 ug | ORAL_CAPSULE | Freq: Every day | ORAL | 1 refills | Status: DC

## 2023-01-05 NOTE — Progress Notes (Signed)
Per Dr Graciela Husbands decrease pt's Furosemide  to 1 tablet by mouth daily.  Stop Levothyroxine ,  Begin Levothyroxine 37.41mcg - 1 cap daily before breakfast. Rx escribed to [facility] and orders faxed to [facility] Attn: Katina at [phone].  Confirmation received.

## 2023-01-06 DIAGNOSIS — I10 Essential (primary) hypertension: Secondary | ICD-10-CM | POA: Diagnosis not present

## 2023-01-06 DIAGNOSIS — I4891 Unspecified atrial fibrillation: Secondary | ICD-10-CM | POA: Diagnosis not present

## 2023-01-06 DIAGNOSIS — R197 Diarrhea, unspecified: Secondary | ICD-10-CM | POA: Diagnosis not present

## 2023-01-06 DIAGNOSIS — M6281 Muscle weakness (generalized): Secondary | ICD-10-CM | POA: Diagnosis not present

## 2023-01-10 DIAGNOSIS — N179 Acute kidney failure, unspecified: Secondary | ICD-10-CM | POA: Diagnosis not present

## 2023-01-10 DIAGNOSIS — I10 Essential (primary) hypertension: Secondary | ICD-10-CM | POA: Diagnosis not present

## 2023-01-10 DIAGNOSIS — N2581 Secondary hyperparathyroidism of renal origin: Secondary | ICD-10-CM | POA: Diagnosis not present

## 2023-01-16 DIAGNOSIS — R202 Paresthesia of skin: Secondary | ICD-10-CM | POA: Diagnosis not present

## 2023-01-16 DIAGNOSIS — G8929 Other chronic pain: Secondary | ICD-10-CM | POA: Diagnosis not present

## 2023-01-16 DIAGNOSIS — G4700 Insomnia, unspecified: Secondary | ICD-10-CM | POA: Diagnosis not present

## 2023-01-16 DIAGNOSIS — N184 Chronic kidney disease, stage 4 (severe): Secondary | ICD-10-CM | POA: Diagnosis not present

## 2023-01-18 DIAGNOSIS — N179 Acute kidney failure, unspecified: Secondary | ICD-10-CM | POA: Diagnosis not present

## 2023-01-18 DIAGNOSIS — N2581 Secondary hyperparathyroidism of renal origin: Secondary | ICD-10-CM | POA: Diagnosis not present

## 2023-01-20 ENCOUNTER — Inpatient Hospital Stay
Admission: EM | Admit: 2023-01-20 | Discharge: 2023-02-01 | DRG: 480 | Disposition: A | Discharge status: Skilled Nursing Facility | Payer: Medicare Other | Attending: Internal Medicine | Admitting: Internal Medicine

## 2023-01-20 ENCOUNTER — Other Ambulatory Visit: Payer: Self-pay

## 2023-01-20 ENCOUNTER — Emergency Department: Payer: Medicare Other

## 2023-01-20 DIAGNOSIS — E1151 Type 2 diabetes mellitus with diabetic peripheral angiopathy without gangrene: Secondary | ICD-10-CM | POA: Diagnosis present

## 2023-01-20 DIAGNOSIS — D62 Acute posthemorrhagic anemia: Secondary | ICD-10-CM | POA: Diagnosis not present

## 2023-01-20 DIAGNOSIS — E782 Mixed hyperlipidemia: Secondary | ICD-10-CM | POA: Diagnosis present

## 2023-01-20 DIAGNOSIS — W19XXXD Unspecified fall, subsequent encounter: Secondary | ICD-10-CM | POA: Diagnosis not present

## 2023-01-20 DIAGNOSIS — S72002A Fracture of unspecified part of neck of left femur, initial encounter for closed fracture: Secondary | ICD-10-CM | POA: Diagnosis not present

## 2023-01-20 DIAGNOSIS — S72001S Fracture of unspecified part of neck of right femur, sequela: Secondary | ICD-10-CM

## 2023-01-20 DIAGNOSIS — K219 Gastro-esophageal reflux disease without esophagitis: Secondary | ICD-10-CM | POA: Diagnosis present

## 2023-01-20 DIAGNOSIS — G2581 Restless legs syndrome: Secondary | ICD-10-CM | POA: Diagnosis present

## 2023-01-20 DIAGNOSIS — W19XXXA Unspecified fall, initial encounter: Secondary | ICD-10-CM

## 2023-01-20 DIAGNOSIS — I9581 Postprocedural hypotension: Secondary | ICD-10-CM | POA: Diagnosis not present

## 2023-01-20 DIAGNOSIS — S72141A Displaced intertrochanteric fracture of right femur, initial encounter for closed fracture: Secondary | ICD-10-CM | POA: Diagnosis not present

## 2023-01-20 DIAGNOSIS — J9602 Acute respiratory failure with hypercapnia: Secondary | ICD-10-CM | POA: Diagnosis not present

## 2023-01-20 DIAGNOSIS — E1121 Type 2 diabetes mellitus with diabetic nephropathy: Secondary | ICD-10-CM | POA: Diagnosis not present

## 2023-01-20 DIAGNOSIS — R945 Abnormal results of liver function studies: Secondary | ICD-10-CM | POA: Diagnosis not present

## 2023-01-20 DIAGNOSIS — R571 Hypovolemic shock: Secondary | ICD-10-CM | POA: Diagnosis present

## 2023-01-20 DIAGNOSIS — I5032 Chronic diastolic (congestive) heart failure: Secondary | ICD-10-CM | POA: Diagnosis present

## 2023-01-20 DIAGNOSIS — D631 Anemia in chronic kidney disease: Secondary | ICD-10-CM | POA: Diagnosis not present

## 2023-01-20 DIAGNOSIS — I2510 Atherosclerotic heart disease of native coronary artery without angina pectoris: Secondary | ICD-10-CM | POA: Diagnosis present

## 2023-01-20 DIAGNOSIS — E872 Acidosis, unspecified: Secondary | ICD-10-CM

## 2023-01-20 DIAGNOSIS — Z79899 Other long term (current) drug therapy: Secondary | ICD-10-CM

## 2023-01-20 DIAGNOSIS — D6489 Other specified anemias: Secondary | ICD-10-CM | POA: Diagnosis not present

## 2023-01-20 DIAGNOSIS — N1832 Chronic kidney disease, stage 3b: Secondary | ICD-10-CM | POA: Diagnosis present

## 2023-01-20 DIAGNOSIS — Z833 Family history of diabetes mellitus: Secondary | ICD-10-CM

## 2023-01-20 DIAGNOSIS — E1149 Type 2 diabetes mellitus with other diabetic neurological complication: Secondary | ICD-10-CM | POA: Diagnosis present

## 2023-01-20 DIAGNOSIS — Z9049 Acquired absence of other specified parts of digestive tract: Secondary | ICD-10-CM | POA: Diagnosis not present

## 2023-01-20 DIAGNOSIS — I10 Essential (primary) hypertension: Secondary | ICD-10-CM

## 2023-01-20 DIAGNOSIS — Y92128 Other place in nursing home as the place of occurrence of the external cause: Secondary | ICD-10-CM

## 2023-01-20 DIAGNOSIS — I959 Hypotension, unspecified: Secondary | ICD-10-CM | POA: Diagnosis not present

## 2023-01-20 DIAGNOSIS — Z91013 Allergy to seafood: Secondary | ICD-10-CM

## 2023-01-20 DIAGNOSIS — N170 Acute kidney failure with tubular necrosis: Secondary | ICD-10-CM | POA: Diagnosis not present

## 2023-01-20 DIAGNOSIS — E861 Hypovolemia: Secondary | ICD-10-CM | POA: Diagnosis present

## 2023-01-20 DIAGNOSIS — E1169 Type 2 diabetes mellitus with other specified complication: Secondary | ICD-10-CM | POA: Diagnosis not present

## 2023-01-20 DIAGNOSIS — E039 Hypothyroidism, unspecified: Secondary | ICD-10-CM | POA: Diagnosis not present

## 2023-01-20 DIAGNOSIS — R7401 Elevation of levels of liver transaminase levels: Secondary | ICD-10-CM | POA: Diagnosis not present

## 2023-01-20 DIAGNOSIS — N184 Chronic kidney disease, stage 4 (severe): Secondary | ICD-10-CM | POA: Diagnosis not present

## 2023-01-20 DIAGNOSIS — I480 Paroxysmal atrial fibrillation: Secondary | ICD-10-CM | POA: Diagnosis not present

## 2023-01-20 DIAGNOSIS — N179 Acute kidney failure, unspecified: Secondary | ICD-10-CM | POA: Diagnosis present

## 2023-01-20 DIAGNOSIS — Z8249 Family history of ischemic heart disease and other diseases of the circulatory system: Secondary | ICD-10-CM

## 2023-01-20 DIAGNOSIS — J9601 Acute respiratory failure with hypoxia: Secondary | ICD-10-CM | POA: Insufficient documentation

## 2023-01-20 DIAGNOSIS — Z794 Long term (current) use of insulin: Secondary | ICD-10-CM

## 2023-01-20 DIAGNOSIS — F418 Other specified anxiety disorders: Secondary | ICD-10-CM | POA: Diagnosis not present

## 2023-01-20 DIAGNOSIS — K760 Fatty (change of) liver, not elsewhere classified: Secondary | ICD-10-CM | POA: Diagnosis present

## 2023-01-20 DIAGNOSIS — G9341 Metabolic encephalopathy: Secondary | ICD-10-CM | POA: Diagnosis not present

## 2023-01-20 DIAGNOSIS — Z950 Presence of cardiac pacemaker: Secondary | ICD-10-CM | POA: Diagnosis not present

## 2023-01-20 DIAGNOSIS — R110 Nausea: Secondary | ICD-10-CM | POA: Diagnosis not present

## 2023-01-20 DIAGNOSIS — I25118 Atherosclerotic heart disease of native coronary artery with other forms of angina pectoris: Secondary | ICD-10-CM | POA: Diagnosis not present

## 2023-01-20 DIAGNOSIS — E1122 Type 2 diabetes mellitus with diabetic chronic kidney disease: Secondary | ICD-10-CM | POA: Diagnosis not present

## 2023-01-20 DIAGNOSIS — U071 COVID-19: Secondary | ICD-10-CM | POA: Diagnosis present

## 2023-01-20 DIAGNOSIS — Z881 Allergy status to other antibiotic agents status: Secondary | ICD-10-CM

## 2023-01-20 DIAGNOSIS — Z885 Allergy status to narcotic agent status: Secondary | ICD-10-CM

## 2023-01-20 DIAGNOSIS — I4821 Permanent atrial fibrillation: Secondary | ICD-10-CM | POA: Diagnosis not present

## 2023-01-20 DIAGNOSIS — I130 Hypertensive heart and chronic kidney disease with heart failure and stage 1 through stage 4 chronic kidney disease, or unspecified chronic kidney disease: Secondary | ICD-10-CM | POA: Diagnosis present

## 2023-01-20 DIAGNOSIS — R579 Shock, unspecified: Secondary | ICD-10-CM

## 2023-01-20 DIAGNOSIS — I272 Pulmonary hypertension, unspecified: Secondary | ICD-10-CM

## 2023-01-20 DIAGNOSIS — Z96653 Presence of artificial knee joint, bilateral: Secondary | ICD-10-CM | POA: Diagnosis present

## 2023-01-20 DIAGNOSIS — F01B18 Vascular dementia, moderate, with other behavioral disturbance: Secondary | ICD-10-CM | POA: Diagnosis not present

## 2023-01-20 DIAGNOSIS — M109 Gout, unspecified: Secondary | ICD-10-CM | POA: Diagnosis present

## 2023-01-20 DIAGNOSIS — Z7901 Long term (current) use of anticoagulants: Secondary | ICD-10-CM | POA: Diagnosis not present

## 2023-01-20 DIAGNOSIS — E1140 Type 2 diabetes mellitus with diabetic neuropathy, unspecified: Secondary | ICD-10-CM | POA: Diagnosis not present

## 2023-01-20 DIAGNOSIS — E1143 Type 2 diabetes mellitus with diabetic autonomic (poly)neuropathy: Secondary | ICD-10-CM | POA: Diagnosis present

## 2023-01-20 DIAGNOSIS — I5030 Unspecified diastolic (congestive) heart failure: Secondary | ICD-10-CM | POA: Diagnosis present

## 2023-01-20 DIAGNOSIS — I4892 Unspecified atrial flutter: Secondary | ICD-10-CM | POA: Diagnosis not present

## 2023-01-20 DIAGNOSIS — M810 Age-related osteoporosis without current pathological fracture: Secondary | ICD-10-CM | POA: Diagnosis present

## 2023-01-20 DIAGNOSIS — Z452 Encounter for adjustment and management of vascular access device: Secondary | ICD-10-CM | POA: Diagnosis not present

## 2023-01-20 DIAGNOSIS — E119 Type 2 diabetes mellitus without complications: Secondary | ICD-10-CM | POA: Diagnosis not present

## 2023-01-20 DIAGNOSIS — I482 Chronic atrial fibrillation, unspecified: Secondary | ICD-10-CM

## 2023-01-20 DIAGNOSIS — Z515 Encounter for palliative care: Secondary | ICD-10-CM | POA: Diagnosis not present

## 2023-01-20 DIAGNOSIS — Z90710 Acquired absence of both cervix and uterus: Secondary | ICD-10-CM

## 2023-01-20 DIAGNOSIS — R079 Chest pain, unspecified: Secondary | ICD-10-CM | POA: Diagnosis not present

## 2023-01-20 DIAGNOSIS — Z7189 Other specified counseling: Secondary | ICD-10-CM | POA: Diagnosis not present

## 2023-01-20 DIAGNOSIS — E874 Mixed disorder of acid-base balance: Secondary | ICD-10-CM | POA: Diagnosis present

## 2023-01-20 DIAGNOSIS — Z8673 Personal history of transient ischemic attack (TIA), and cerebral infarction without residual deficits: Secondary | ICD-10-CM

## 2023-01-20 DIAGNOSIS — N390 Urinary tract infection, site not specified: Secondary | ICD-10-CM | POA: Diagnosis present

## 2023-01-20 DIAGNOSIS — E1129 Type 2 diabetes mellitus with other diabetic kidney complication: Secondary | ICD-10-CM | POA: Diagnosis present

## 2023-01-20 DIAGNOSIS — Z7401 Bed confinement status: Secondary | ICD-10-CM | POA: Diagnosis not present

## 2023-01-20 DIAGNOSIS — W010XXA Fall on same level from slipping, tripping and stumbling without subsequent striking against object, initial encounter: Secondary | ICD-10-CM | POA: Diagnosis present

## 2023-01-20 DIAGNOSIS — S72009A Fracture of unspecified part of neck of unspecified femur, initial encounter for closed fracture: Secondary | ICD-10-CM | POA: Diagnosis present

## 2023-01-20 DIAGNOSIS — Z886 Allergy status to analgesic agent status: Secondary | ICD-10-CM

## 2023-01-20 DIAGNOSIS — Z884 Allergy status to anesthetic agent status: Secondary | ICD-10-CM

## 2023-01-20 DIAGNOSIS — Z888 Allergy status to other drugs, medicaments and biological substances status: Secondary | ICD-10-CM

## 2023-01-20 DIAGNOSIS — F419 Anxiety disorder, unspecified: Secondary | ICD-10-CM | POA: Diagnosis not present

## 2023-01-20 DIAGNOSIS — S72141D Displaced intertrochanteric fracture of right femur, subsequent encounter for closed fracture with routine healing: Secondary | ICD-10-CM | POA: Diagnosis not present

## 2023-01-20 DIAGNOSIS — S72001A Fracture of unspecified part of neck of right femur, initial encounter for closed fracture: Secondary | ICD-10-CM | POA: Diagnosis not present

## 2023-01-20 DIAGNOSIS — Z91041 Radiographic dye allergy status: Secondary | ICD-10-CM

## 2023-01-20 DIAGNOSIS — Z823 Family history of stroke: Secondary | ICD-10-CM

## 2023-01-20 DIAGNOSIS — Z882 Allergy status to sulfonamides status: Secondary | ICD-10-CM

## 2023-01-20 DIAGNOSIS — Z883 Allergy status to other anti-infective agents status: Secondary | ICD-10-CM

## 2023-01-20 DIAGNOSIS — M25551 Pain in right hip: Secondary | ICD-10-CM | POA: Diagnosis not present

## 2023-01-20 HISTORY — DX: Type 2 diabetes mellitus with diabetic neuropathy, unspecified: E11.40

## 2023-01-20 HISTORY — DX: Pulmonary hypertension, unspecified: I27.20

## 2023-01-20 HISTORY — DX: Chronic kidney disease, stage 4 (severe): N18.4

## 2023-01-20 HISTORY — DX: Chronic diastolic (congestive) heart failure: I50.32

## 2023-01-20 LAB — TYPE AND SCREEN

## 2023-01-20 LAB — CBC WITH DIFFERENTIAL/PLATELET
Abs Immature Granulocytes: 0.04 10*3/uL (ref 0.00–0.07)
Basophils Absolute: 0 10*3/uL (ref 0.0–0.1)
Basophils Relative: 1 %
Eosinophils Absolute: 0.1 10*3/uL (ref 0.0–0.5)
Eosinophils Relative: 1 %
HCT: 39.2 % (ref 36.0–46.0)
Hemoglobin: 13 g/dL (ref 12.0–15.0)
Immature Granulocytes: 1 %
Lymphocytes Relative: 28 %
Lymphs Abs: 1.6 10*3/uL (ref 0.7–4.0)
MCH: 32.4 pg (ref 26.0–34.0)
MCHC: 33.2 g/dL (ref 30.0–36.0)
MCV: 97.8 fL (ref 80.0–100.0)
Monocytes Absolute: 0.4 10*3/uL (ref 0.1–1.0)
Monocytes Relative: 8 %
Neutro Abs: 3.5 10*3/uL (ref 1.7–7.7)
Neutrophils Relative %: 61 %
Platelets: 160 10*3/uL (ref 150–400)
RBC: 4.01 MIL/uL (ref 3.87–5.11)
RDW: 13.5 % (ref 11.5–15.5)
WBC: 5.6 10*3/uL (ref 4.0–10.5)
nRBC: 0 % (ref 0.0–0.2)

## 2023-01-20 LAB — COMPREHENSIVE METABOLIC PANEL
ALT: 37 U/L (ref 0–44)
AST: 72 U/L — ABNORMAL HIGH (ref 15–41)
Albumin: 4.1 g/dL (ref 3.5–5.0)
Alkaline Phosphatase: 151 U/L — ABNORMAL HIGH (ref 38–126)
Anion gap: 13 (ref 5–15)
BUN: 42 mg/dL — ABNORMAL HIGH (ref 8–23)
CO2: 21 mmol/L — ABNORMAL LOW (ref 22–32)
Calcium: 9.1 mg/dL (ref 8.9–10.3)
Chloride: 102 mmol/L (ref 98–111)
Creatinine, Ser: 1.83 mg/dL — ABNORMAL HIGH (ref 0.44–1.00)
GFR, Estimated: 27 mL/min — ABNORMAL LOW (ref >60)
Glucose, Bld: 173 mg/dL — ABNORMAL HIGH (ref 70–99)
Potassium: 4.7 mmol/L (ref 3.5–5.1)
Sodium: 136 mmol/L (ref 135–145)
Total Bilirubin: 1.7 mg/dL — ABNORMAL HIGH (ref 0.3–1.2)
Total Protein: 7.3 g/dL (ref 6.5–8.1)

## 2023-01-20 LAB — PROTIME-INR
INR: 1.2 (ref 0.8–1.2)
Prothrombin Time: 15.1 seconds (ref 11.4–15.2)

## 2023-01-20 LAB — APTT: aPTT: 31 seconds (ref 24–36)

## 2023-01-20 MED ORDER — ONDANSETRON HCL 4 MG/2ML IJ SOLN
4.0000 mg | Freq: Once | INTRAMUSCULAR | Status: AC
  Administered 2023-01-20: 4 mg via INTRAVENOUS
  Filled 2023-01-20: qty 2

## 2023-01-20 MED ORDER — CIPROFLOXACIN IN D5W 400 MG/200ML IV SOLN
400.0000 mg | INTRAVENOUS | Status: DC
  Administered 2023-01-20 – 2023-01-21 (×2): 400 mg via INTRAVENOUS
  Filled 2023-01-20: qty 200

## 2023-01-20 MED ORDER — AMIODARONE HCL 200 MG PO TABS: 200.0000 mg | ORAL_TABLET | Freq: Every day | ORAL | Status: DC

## 2023-01-20 MED ORDER — SODIUM CHLORIDE 0.9 % IV SOLN: INTRAVENOUS | Status: DC

## 2023-01-20 MED ORDER — FENTANYL CITRATE PF 50 MCG/ML IJ SOSY
50.0000 ug | PREFILLED_SYRINGE | Freq: Once | INTRAMUSCULAR | Status: AC
  Administered 2023-01-20: 50 ug via INTRAVENOUS
  Filled 2023-01-20: qty 1

## 2023-01-20 MED ORDER — FENTANYL CITRATE PF 50 MCG/ML IJ SOSY
50.0000 ug | PREFILLED_SYRINGE | INTRAMUSCULAR | Status: DC | PRN
  Administered 2023-01-20: 50 ug via INTRAVENOUS
  Filled 2023-01-20: qty 1

## 2023-01-20 MED ORDER — HYDROMORPHONE HCL 1 MG/ML IJ SOLN
0.5000 mg | INTRAMUSCULAR | Status: DC | PRN
  Administered 2023-01-20: 0.5 mg via INTRAVENOUS
  Filled 2023-01-20: qty 0.5

## 2023-01-20 MED ORDER — TRAZODONE HCL 50 MG PO TABS
50.0000 mg | ORAL_TABLET | Freq: Every day | ORAL | Status: DC
  Administered 2023-01-20: 50 mg via ORAL
  Filled 2023-01-20: qty 1

## 2023-01-20 MED ORDER — ONDANSETRON HCL 4 MG/2ML IJ SOLN: 4.0000 mg | Freq: Four times a day (QID) | INTRAMUSCULAR | Status: DC | PRN

## 2023-01-20 MED ORDER — SODIUM CHLORIDE 0.9 % IV BOLUS
1000.0000 mL | Freq: Once | INTRAVENOUS | Status: AC
  Administered 2023-01-20: 1000 mL via INTRAVENOUS

## 2023-01-20 MED ORDER — CEFAZOLIN SODIUM-DEXTROSE 2-4 GM/100ML-% IV SOLN
2.0000 g | Freq: Once | INTRAVENOUS | Status: AC
  Administered 2023-01-21: 2 g via INTRAVENOUS
  Filled 2023-01-20: qty 100

## 2023-01-20 MED ORDER — LEVOTHYROXINE SODIUM 75 MCG PO TABS
37.5000 ug | ORAL_TABLET | Freq: Every day | ORAL | Status: DC
  Administered 2023-01-23 – 2023-02-01 (×10): 37.5 ug via ORAL
  Filled 2023-01-20: qty 0.5
  Filled 2023-01-20 (×2): qty 2
  Filled 2023-01-20 (×2): qty 0.5
  Filled 2023-01-20: qty 2
  Filled 2023-01-20: qty 0.5
  Filled 2023-01-20: qty 2
  Filled 2023-01-20 (×2): qty 0.5
  Filled 2023-01-20 (×2): qty 2
  Filled 2023-01-20 (×5): qty 0.5

## 2023-01-20 MED ORDER — FENTANYL CITRATE PF 50 MCG/ML IJ SOSY
75.0000 ug | PREFILLED_SYRINGE | Freq: Once | INTRAMUSCULAR | Status: AC
  Administered 2023-01-20: 75 ug via INTRAVENOUS
  Filled 2023-01-20: qty 2

## 2023-01-20 MED ORDER — TRANEXAMIC ACID-NACL 1000-0.7 MG/100ML-% IV SOLN
1000.0000 mg | INTRAVENOUS | Status: AC
  Administered 2023-01-20: 1000 mg via INTRAVENOUS
  Filled 2023-01-20: qty 100

## 2023-01-20 MED ORDER — HYDROMORPHONE HCL 1 MG/ML IJ SOLN
0.5000 mg | INTRAMUSCULAR | Status: DC | PRN
  Administered 2023-01-20 – 2023-01-22 (×6): 0.5 mg via INTRAVENOUS
  Filled 2023-01-20 (×6): qty 1

## 2023-01-20 MED ORDER — ALLOPURINOL 100 MG PO TABS
100.0000 mg | ORAL_TABLET | Freq: Every day | ORAL | Status: DC
  Administered 2023-01-23 – 2023-02-01 (×10): 100 mg via ORAL
  Filled 2023-01-20 (×12): qty 1

## 2023-01-20 MED ORDER — ONDANSETRON HCL 4 MG PO TABS: 4.0000 mg | ORAL_TABLET | Freq: Four times a day (QID) | ORAL | Status: DC | PRN

## 2023-01-20 MED ORDER — MIDODRINE HCL 5 MG PO TABS
5.0000 mg | ORAL_TABLET | Freq: Three times a day (TID) | ORAL | Status: DC
  Administered 2023-01-20 – 2023-01-23 (×4): 5 mg via ORAL
  Filled 2023-01-20 (×4): qty 1

## 2023-01-20 NOTE — Consult Note (Signed)
ORTHOPAEDIC CONSULTATION  REQUESTING PHYSICIAN: Floydene Flock, MD  Chief Complaint:   R hip pain  History of Present Illness: History obtained from review of the chart, discussion with medical providers, the patient, and her son at the bedside.  Deborah Obrien is a 82 y.o. female who had a fall earlier today.  The patient noted immediate hip pain and inability to ambulate.  The patient ambulates with a walker at baseline.  The patient lives at [facility]. Pain is worse with any sort of movement.  X-rays in the emergency department show a right intertrochanteric hip fracture.  She does have a medical history significant for atrial fibrillation on Eliquis (last dose earlier this morning), anemia, stage IV CKD, diabetes, hypertension, hypothyroidism, and pacemaker placement.   Past Medical History:  Diagnosis Date   A-fib (HCC) 2016   Acid reflux    Acute respiratory failure (HCC) 04/09/2018   Anemia    Anxiety    Atrial flutter (HCC) 08/24/2017   Bowel obstruction (HCC)    Carotid artery disease (HCC)    Chronic kidney disease (CKD) stage G3a/A1, moderately decreased glomerular filtration rate (GFR) between 45-59 mL/min/1.73 square meter and albuminuria creatinine ratio less than 30 mg/g (HCC)    stage IV   Closed fracture of one rib of right side 05/29/2020   Complete tear of left rotator cuff 10/16/2014   Coronary artery disease    Depression    Diabetes mellitus without complication (HCC)    Diarrhea 03/04/2022   Dyspnea    Dysrhythmia    PAROXYSMAL ATRIAL FIBRILLATION   Fatty liver    Gastritis without bleeding    GI bleed    Gout    Hypertension    Hypothyroidism    Low back pain    history of kyphoplasty t12-l1   Morbid obesity (HCC)    Multiple gastric polyps    history of gi bleed.  polyps removed   Neuromuscular disorder (HCC)    diabetic neuropathy   Osteoarthritis     Osteoporosis    Pacemaker 04/09/2018   MDT- upgraded to duel chamber device Dec 2018- lead revision post upgrade dec 2018   Paroxysmal atrial fibrillation (HCC)    Presence of permanent cardiac pacemaker    Sinus node dysfunction (HCC) 08/24/2017   Stroke (HCC)    TIA, patient unaware that she had it. 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 plate & screws    BACK SURGERY  2019   patient describes kyphoplasty for compression fractures, MD referral said fusion   BREAST BIOPSY Right 08/16   stereo fibroadenomatous change, neg for atypia   BREAST EXCISIONAL BIOPSY Left    neg   CARDIAC CATHETERIZATION Left 08/25/2016   Procedure: Left Heart Cath and Coronary Angiography;  Surgeon: Lamar Blinks, MD;  Location: [facility] INVASIVE CV LAB;  Service: Cardiovascular;  Laterality: Left;   CARDIOVERSION N/A 06/28/2017   Procedure: CARDIOVERSION;  Surgeon: Lamar Blinks, MD;  Location: [facility] ORS;  Service: Cardiovascular;  Laterality: N/A;   CARDIOVERSION N/A 02/06/2018   Procedure: CARDIOVERSION;  Surgeon: Wendall Stade, MD;  Location: [facility] ENDOSCOPY;  Service: Cardiovascular;  Laterality: N/A;   CARDIOVERSION N/A 07/15/2020   Procedure: CARDIOVERSION;  Surgeon: Jake Bathe, MD;  Location: [facility] ENDOSCOPY;  Service: Cardiovascular;  Laterality: N/A;   CATARACT EXTRACTION, BILATERAL  2012   CHOLECYSTECTOMY     colon blockage  1999   COLON SURGERY  2000   removed 20% of colon. colon had collapsed   COLONOSCOPY  2016   polyps removed 2016   DORSAL COMPARTMENT RELEASE Left 09/14/2016   Procedure: RELEASE DORSAL  COMPARTMENT (DEQUERVAIN);  Surgeon: Donato Heinz, MD;  Location: [facility] ORS;  Service: Orthopedics;  Laterality: Left;   ESOPHAGOGASTRODUODENOSCOPY N/A 01/27/2015   Procedure: ESOPHAGOGASTRODUODENOSCOPY (EGD);  Surgeon: Christena Deem, MD;  Location: [facility] ENDOSCOPY;  Service: Endoscopy;  Laterality: N/A;   ESOPHAGOGASTRODUODENOSCOPY (EGD) WITH PROPOFOL N/A 07/24/2015   Procedure: ESOPHAGOGASTRODUODENOSCOPY (EGD) WITH PROPOFOL;  Surgeon: Midge Minium, MD;  Location: [facility] ENDOSCOPY;  Service: Endoscopy;  Laterality: N/A;   ESOPHAGOGASTRODUODENOSCOPY (EGD) WITH PROPOFOL N/A 04/12/2018   Procedure: ESOPHAGOGASTRODUODENOSCOPY (EGD) WITH PROPOFOL;  Surgeon: Wyline Mood, MD;  Location: [facility] ENDOSCOPY;  Service: Gastroenterology;  Laterality: N/A;   ESOPHAGOGASTRODUODENOSCOPY (EGD) WITH PROPOFOL N/A 06/22/2018   Procedure: ESOPHAGOGASTRODUODENOSCOPY (EGD) WITH PROPOFOL;  Surgeon: Rachael Fee, MD;  Location: [facility] ENDOSCOPY;  Service: Endoscopy;  Laterality: N/A;   ESOPHAGOGASTRODUODENOSCOPY (EGD) WITH PROPOFOL N/A 07/09/2018   Procedure: ESOPHAGOGASTRODUODENOSCOPY (EGD) WITH PROPOFOL with resection of gastric polyps;  Surgeon: Wyline Mood, MD;  Location: [facility] ENDOSCOPY;  Service: Gastroenterology;  Laterality: N/A;   ESOPHAGOGASTRODUODENOSCOPY (EGD) WITH PROPOFOL N/A 05/17/2019   Procedure: ESOPHAGOGASTRODUODENOSCOPY (EGD) WITH PROPOFOL;  Surgeon: Wyline Mood, MD;  Location: [facility] ENDOSCOPY;  Service: Gastroenterology;  Laterality: N/A;   EUS N/A 06/22/2018   Procedure: UPPER ENDOSCOPIC ULTRASOUND (EUS) RADIAL;  Surgeon: Rachael Fee, MD;  Location: [facility] ENDOSCOPY;  Service: Endoscopy;  Laterality: N/A;   EYE SURGERY Bilateral    cataract extractions   HUMERUS IM NAIL Left 06/02/2022   Procedure: INTRAMEDULLARY (IM) NAIL HUMERAL;  Surgeon: Christena Flake, MD;  Location: [facility] ORS;  Service: Orthopedics;  Laterality: Left;   JOINT REPLACEMENT Bilateral 2014   Bilateral Knee replacement   LEAD  REVISION/REPAIR N/A 08/29/2017   Procedure: LEAD REVISION/REPAIR;  Surgeon: Regan Lemming, MD;  Location: [facility]VAS[facility] CV LAB;  Service: Cardiovascular;  Laterality: N/A;   OOPHORECTOMY     PACEMAKER INSERTION Left 07/01/2015   Procedure: INSERTION PACEMAKER;  Surgeon: Marcina Millard, MD;  Location: [facility] ORS;  Service: Cardiovascular;  Laterality: Left;   PACEMAKER REVISION N/A 08/28/2017   Procedure: PACEMAKER REVISION;  Surgeon: Duke Salvia, MD;  Location: [facility] INVASIVE CV LAB;  Service: Cardiovascular;  Laterality: N/A;   REPLACEMENT TOTAL KNEE BILATERAL     SHOULDER ARTHROSCOPY WITH ROTATOR CUFF REPAIR AND OPEN BICEPS TENODESIS Left 11/21/2019   Procedure: LEFT SHOULDER ARTHROSCOPY WITH DEBRIDEMENT, DECOMPRESSION, ROTATOR CUFF REPAIR AND BICEPS TENOLYSIS;  Surgeon: Christena Flake, MD;  Location: [facility] ORS;  Service: Orthopedics;  Laterality: Left;   SHOULDER ARTHROSCOPY WITH SUBACROMIAL DECOMPRESSION Left 2013   TEE WITHOUT CARDIOVERSION N/A 06/28/2017   Procedure: TRANSESOPHAGEAL ECHOCARDIOGRAM (TEE);  Surgeon: Lamar Blinks, MD;  Location: [facility] ORS;  Service: Cardiovascular;  Laterality: N/A;   Social History   Socioeconomic History   Marital status: Married    Spouse name: [name]   Number of children: Not on file   Years of education: Not on file   Highest education level: Not on file  Occupational History   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    Comment: retired  Tobacco Use   Smoking status: Never   Smokeless tobacco: Never  Vaping Use   Vaping Use: Never used  Substance and Sexual Activity   Alcohol use: No   Drug use: No   Sexual activity: Yes  Other Topics Concern   Not on file  Social History Narrative   DIABETIC   PACEMAKER   Social Determinants of Health   Financial Resource Strain: Not on file  Food Insecurity: Not on file  Transportation Needs: Not on file  Physical Activity: Not on file  Stress: Not on file  Social Connections: Not on file    Family History  Problem Relation Age of Onset   Diabetes Mellitus II Mother    CAD Mother    Heart attack Mother    Cancer Father        skin   Diabetes Mellitus II Brother    Stroke Brother    Breast cancer Neg Hx    Allergies  Allergen Reactions   Cephalosporins Other (See Comments), Diarrhea and Nausea Only    Reaction:  Unknown  Other reaction(s): Other (see comments) Other reaction(s): Other (See Comments) Other Reaction: Intolerance  Reaction:  Stomach pain  Other Reaction: Intolerance Reaction:  Unknown    Fish Allergy Shortness Of Breath    All kinds of fish. Severe vomitting even with smell of fish.    Macrolides And Ketolides Other (See Comments)    Severe stomach pain (mycins)   Meperidine Shortness Of Breath, Nausea Only and Other (See Comments)    Stomach pain    Other Nausea Only, Swelling, Rash, Anaphylaxis, Diarrhea, Shortness Of Breath and Other (See Comments)    Allergen: NON-STEROIDS, bextra - hands and feet swell  Reaction:  Stomach pain   Other Reaction: Intolerance   Prednisone Anaphylaxis and Other (See Comments)    (facial swelling/redness/burning)Increases pts blood sugar  Pt states that she is allergic to all steroids.     Shellfish Allergy Anaphylaxis, Shortness Of Breath, Diarrhea, Nausea Only and Rash    Stomach pain    Sulfa Antibiotics Shortness Of Breath, Diarrhea, Nausea Only and Other (See Comments)    Reaction:  Stomach pain   Reaction:  Stomach pain  Other Reaction: Intolerance   Sulfacetamide Sodium Diarrhea, Nausea Only, Other (See Comments) and Shortness Of Breath    Reaction:  Stomach pain    Telbivudine Other (See Comments)    Unknown reaction   Uloric [Febuxostat] Anaphylaxis    Locks pt's body up    Aspirin Rash and Other (See Comments)    Stomach pain (tolerates lower doses)   Celecoxib Other (See Comments)    GI bleed, weakness, and stomach pain.     Cephalexin Diarrhea, Nausea Only and Other (See Comments)     stomach pain    Codeine Diarrhea, Nausea Only and Other (See Comments)    Reaction:  Stomach pain Pt tolerates morphine    Dilaudid [Hydromorphone Hcl] Other (See Comments)    Reactions: easy to overdose - blood pressure drops really low   Erythromycin Diarrhea, Nausea Only and Other (See Comments)    Reaction:  Stomach pain    Iodinated Contrast Media Rash and Other (See Comments)    Pt states that she is unable to have because she has chronic kidney disease.   CKD Pt states that she is unable to have because she has chronic kidney disease.   CKD  Lipitor [Atorvastatin Calcium] Nausea Only    Reaction: nausea, weakness, pass blood   Oxycodone Other (See Comments)    Reaction:  Stomach pain    Aleve [Naproxen]     Reaction: severe stomach pain   Atorvastatin Nausea Only and Other (See Comments)    Weakness    Doxycycline     Stomach pain    Iodine Rash   Motrin [Ibuprofen]     Reaction: severe stomach pain   Tape Other (See Comments)    Causes pts skin to tear  Pt states that she is able to use paper tape.       Valdecoxib Swelling and Other (See Comments)    Pt states that her hands and feet swell.     Prior to Admission medications   Medication Sig Start Date End Date Taking? Authorizing Provider  acetaminophen (TYLENOL) 325 MG tablet Take 2 tablets (650 mg total) by mouth every 6 (six) hours as needed for mild pain, moderate pain or fever (or Fever >/= 101). Patient taking differently: Take 325 mg by mouth every 6 (six) hours as needed for mild pain, moderate pain or fever (or Fever >/= 101). 06/08/22  Yes Lurene Shadow, MD  acetaminophen (TYLENOL) 500 MG tablet Take 500 mg by mouth in the morning, at noon, and at bedtime.   Yes [provider]  albuterol (VENTOLIN HFA) 108 (90 Base) MCG/ACT inhaler Inhale 2 puffs into the lungs every 6 (six) hours as needed for wheezing or shortness of breath. Ventolin   Yes [provider]  allopurinol (ZYLOPRIM) 100  MG tablet Take 100 mg by mouth daily.   Yes [provider]  amiodarone (PACERONE) 200 MG tablet TAKE 1 TABLET BY MOUTH ONCE DAILY . APPOINTMENT REQUIRED FOR FUTURE REFILLS 02/14/22  Yes Duke Salvia, MD  apixaban (ELIQUIS) 2.5 MG TABS tablet Take 1 tablet (2.5 mg total) by mouth 2 (two) times daily. 03/06/22  Yes Sunnie Nielsen, DO  ciprofloxacin (CIPRO) 500 MG tablet Take 500 mg by mouth daily. 05/07 - 05/14   Yes [provider]  docusate sodium (COLACE) 100 MG capsule Take 100 mg by mouth 2 (two) times daily.   Yes [provider]  famotidine (PEPCID) 10 MG tablet Take 10 mg by mouth daily.   Yes [provider]  Fluticasone Furoate (FLONASE SENSIMIST NA) Place 1 spray into the nose daily.   Yes [provider]  furosemide (LASIX) 40 MG tablet Take 40 mg by mouth daily.   Yes [provider]  gabapentin (NEURONTIN) 100 MG capsule Take 100 mg by mouth 2 (two) times daily. 05/07 - 05/11   Yes [provider]  HUMALOG 100 UNIT/ML injection Inject 0-12 Units into the skin 3 (three) times daily with meals. Sliding scale 05/08/22  Yes [provider]  LANTUS 100 UNIT/ML injection 16 Units at bedtime. 05/10/22  Yes [provider]  Levothyroxine Sodium 37.5 MCG CAPS Take 37.5 mcg by mouth daily before breakfast. 01/05/23  Yes Duke Salvia, MD  midodrine (PROAMATINE) 5 MG tablet Take 5 mg by mouth 3 (three) times daily with meals. 01/18/23 07/17/23 Yes [provider]  oxybutynin (DITROPAN) 5 MG tablet Take 5 mg by mouth 3 (three) times daily. Breakfast & bedtime   Yes [provider]  Polyethyl Glycol-Propyl Glycol 0.4-0.3 % SOLN Place 1 drop into both eyes 2 (two) times daily as needed (dry eyes).   Yes [provider]  pravastatin (PRAVACHOL) 20 MG tablet  Take 20 mg by mouth at bedtime.  07/25/18  Yes [provider]  Prenat-FeCbn-FeBisg-FA-Omega (MULTIVITAMIN/MINERALS PO) Take 1  tablet by mouth daily.   Yes [provider]  rOPINIRole (REQUIP) 0.25 MG tablet Take 1 tablet (0.25 mg total) by mouth at bedtime. 06/08/22  Yes Lurene Shadow, MD  sertraline (ZOLOFT) 25 MG tablet Take 25 mg by mouth daily.   Yes [provider]  sertraline (ZOLOFT) 50 MG tablet Take 50 mg by mouth daily.   Yes [provider]  talc powder Apply 1 application topically as needed (under the breast irritation).   Yes [provider]  traZODone (DESYREL) 50 MG tablet Take 50 mg by mouth at bedtime as needed for sleep. 06/07/18  Yes [provider]  traZODone (DESYREL) 50 MG tablet Take 25 mg by mouth 2 (two) times daily as needed for sleep. Do not give within 2 hours of night dose   Yes [provider]  traZODone (DESYREL) 50 MG tablet Take 50 mg by mouth at bedtime.   Yes [provider]  vitamin B-12 (CYANOCOBALAMIN) 1000 MCG tablet Take 1,000 mcg by mouth daily.   Yes [provider]  alum & mag hydroxide-simeth (MAALOX/MYLANTA) 200-200-20 MG/5ML suspension Take 10 mLs by mouth 3 (three) times daily as needed for indigestion or heartburn.    [provider]  senna (SENOKOT) 8.6 MG TABS tablet Take 1 tablet by mouth every Monday, Wednesday, and Friday.    [provider]  Simethicone LIQD 10 mLs by Does not apply route 3 (three) times daily as needed. 200-200-20 mg/5 mL Patient not taking: Reported on 01/20/2023    [provider]   Recent Labs    01/20/23 1022 01/20/23 1045  WBC 5.6  --   HGB 13.0  --   HCT 39.2  --   PLT 160  --   K 4.7  --   CL 102  --   CO2 21*  --   BUN 42*  --   CREATININE 1.83*  --   GLUCOSE 173*  --   CALCIUM 9.1  --   INR  --  1.2   DG Chest Portable 1 View  Result Date: 01/20/2023 CLINICAL DATA:  Tripped and fell, right hip pain EXAM: PORTABLE CHEST 1 VIEW COMPARISON:  06/03/2022 FINDINGS: Single frontal view of the chest demonstrates a stable dual lead  pacemaker. Cardiac silhouette is unremarkable. No acute airspace disease, effusion, or pneumothorax. No acute displaced fracture. Chronic lower thoracic compression deformity and prior vertebral augmentation. IMPRESSION: 1. No acute intrathoracic process. Electronically Signed   By: Sharlet Salina M.D.   On: 01/20/2023 11:04   DG Hip Unilat W or Wo Pelvis 2-3 Views Right  Result Date: 01/20/2023 CLINICAL DATA:  Right hip pain EXAM: DG HIP (WITH OR WITHOUT PELVIS) 2-3V RIGHT COMPARISON:  04/17/2018 FINDINGS: Frontal view of the pelvis as well as frontal and frogleg lateral views of the right hip are obtained. There is a comminuted intertrochanteric right hip fracture, with impaction and varus angulation at the fracture site. No dislocation. Mild symmetrical bilateral hip osteoarthritis. Postsurgical changes at the lumbosacral junction. IMPRESSION: 1. Comminuted intertrochanteric right hip fracture, with impaction and varus angulation at the fracture site. 2. Mild symmetrical bilateral hip osteoarthritis. Electronically Signed   By: Sharlet Salina M.D.   On: 01/20/2023 11:01     Positive ROS: All other systems have been reviewed and were otherwise negative with the exception of those mentioned in the  HPI and as above.  Physical Exam: BP 129/61   Pulse 65   Temp 98.7 F (37.1 C) (Oral)   Resp (!) 25   Wt 72 kg   SpO2 100%   BMI 28.12 kg/m  General:  Alert, no acute distress Psychiatric:  Patient is competent for consent with normal mood and affect     Orthopedic Exam:  RLE: 5/5 DF/PF/EHL SILT grossly over foot Foot wwp +Log roll/axial load Leg is shortened and externally rotated   X-rays:  As above: R intertrochanteric hip fracture  Assessment/Plan: Deborah Obrien is a 82 y.o. female with a R intertrochanteric hip fracture   1. I discussed the various treatment options including both surgical and non-surgical management of the fracture with the patient and her son at the  bedside. We discussed the high risk of perioperative complications due to patient's age and other co-morbidities. After discussion of risks, benefits, and alternatives to surgery, the family and/or patient were in agreement to proceed with surgery. The goals of surgery would be to provide adequate pain relief and allow for mobilization. Plan for surgery is R hip cephalomedullary nailing tomorrow, 01/21/23 2. NPO after midnight 3. Hold anticoagulation in advance of OR 4. Admit to Hospitalist service   Signa Kell   01/20/2023 4:18 PM

## 2023-01-20 NOTE — Assessment & Plan Note (Signed)
On pravachol   

## 2023-01-20 NOTE — Progress Notes (Signed)
OT Cancellation Note  Patient Details Name: Deborah Obrien MRN: [id number] DOB: 1941-09-10   Cancelled Treatment:    Reason Eval/Treat Not Completed: Patient not medically ready. OT consult received. Pt with hip fracture, pending ortho consult to inform plan of care. Per OT order to assess post-operatively. Will hold OT this date and attempt next date pending surgery with continuation of current OT consult or new OT consult placed.   Arman Filter., MPH, MS, OTR/L ascom [phone] 01/20/23, 1:24 PM

## 2023-01-20 NOTE — H&P (Signed)
History and Physical    Patient: Deborah Obrien:[id number] DOB: Jun 11, 1941 DOA: 01/20/2023 DOS: the patient was seen and examined on 01/20/2023 PCP: Melonie Florida, FNP  Patient coming from: Home  Chief Complaint:  Chief Complaint  Patient presents with   Fall   HPI: Deborah Obrien is a 82 y.o. female with medical history significant of atrial fibrillation on Eliquis, GERD, anxiety, anemia, stage IV CKD, diabetes mellitus, hypertension, hypothyroidism, obesity, restless leg syndrome presenting with fall and right hip fracture.  Patient resident of local skilled nursing facility.  Husband is currently at another skilled nursing facility in the setting of vascular dementia per patient.  Patient reports attempting to go to a medical visit with her husband.  Patient uses walker at baseline.  Patient left the building then turned back in to get her phone when she tripped over her walker landing on her right hip.  No reported head trauma loss conscious.  Patient denies any weakness or dizziness prior to fall.  No chest pain or shortness of breath.  No nausea or vomiting.  Has had significant pain after the fall.  Noted mission back in September 2023 with humerus fracture status post operative repair.  On Coumadin in the setting of Eliquis. Presented to the ER afebrile, hemodynamically stable.  White count 5.6, hemoglobin 13, creatinine 1.8, glucose 173, alk phos 151, T. bili 1.7.  Hip plain films with comminuted intertrochanteric right hip fracture with impaction and varus angulation at the fracture site.  Chest x-ray within normal limits.  EKG with junctional rhythm.  Per Dr. Fuller Plan, case discussed with on-call orthopedics Dr. Allena Katz who will evaluate the patient today. Review of Systems: As mentioned in the history of present illness. All other systems reviewed and are negative. Past Medical History:  Diagnosis Date   A-fib (HCC) 2016   Acid reflux    Acute respiratory failure (HCC) 04/09/2018    Anemia    Anxiety    Atrial flutter (HCC) 08/24/2017   Bowel obstruction (HCC)    Carotid artery disease (HCC)    Chronic kidney disease (CKD) stage G3a/A1, moderately decreased glomerular filtration rate (GFR) between 45-59 mL/min/1.73 square meter and albuminuria creatinine ratio less than 30 mg/g (HCC)    stage IV   Closed fracture of one rib of right side 05/29/2020   Complete tear of left rotator cuff 10/16/2014   Coronary artery disease    Depression    Diabetes mellitus without complication (HCC)    Diarrhea 03/04/2022   Dyspnea    Dysrhythmia    PAROXYSMAL ATRIAL FIBRILLATION   Fatty liver    Gastritis without bleeding    GI bleed    Gout    Hypertension    Hypothyroidism    Low back pain    history of kyphoplasty t12-l1   Morbid obesity (HCC)    Multiple gastric polyps    history of gi bleed.  polyps removed   Neuromuscular disorder (HCC)    diabetic neuropathy   Osteoarthritis    Osteoporosis    Pacemaker 04/09/2018   MDT- upgraded to duel chamber device Dec 2018- lead revision post upgrade dec 2018   Paroxysmal atrial fibrillation (HCC)    Presence of permanent cardiac pacemaker    Sinus node dysfunction (HCC) 08/24/2017   Stroke (HCC)    TIA, patient unaware that she had it. 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 plate & screws    BACK SURGERY  2019   patient describes kyphoplasty for compression fractures, MD referral said fusion   BREAST BIOPSY Right 08/16   stereo fibroadenomatous change, neg for atypia   BREAST EXCISIONAL BIOPSY Left    neg   CARDIAC CATHETERIZATION  Left 08/25/2016   Procedure: Left Heart Cath and Coronary Angiography;  Surgeon: Lamar Blinks, MD;  Location: [facility] INVASIVE CV LAB;  Service: Cardiovascular;  Laterality: Left;   CARDIOVERSION N/A 06/28/2017   Procedure: CARDIOVERSION;  Surgeon: Lamar Blinks, MD;  Location: [facility] ORS;  Service: Cardiovascular;  Laterality: N/A;   CARDIOVERSION N/A 02/06/2018   Procedure: CARDIOVERSION;  Surgeon: Wendall Stade, MD;  Location: [facility] ENDOSCOPY;  Service: Cardiovascular;  Laterality: N/A;   CARDIOVERSION N/A 07/15/2020   Procedure: CARDIOVERSION;  Surgeon: Jake Bathe, MD;  Location: [facility] ENDOSCOPY;  Service: Cardiovascular;  Laterality: N/A;   CATARACT EXTRACTION, BILATERAL  2012   CHOLECYSTECTOMY     colon blockage  1999   COLON SURGERY  2000   removed 20% of colon. colon had collapsed   COLONOSCOPY  2016   polyps removed 2016   DORSAL COMPARTMENT RELEASE Left 09/14/2016   Procedure: RELEASE DORSAL COMPARTMENT (DEQUERVAIN);  Surgeon: Donato Heinz, MD;  Location: [facility] ORS;  Service: Orthopedics;  Laterality: Left;   ESOPHAGOGASTRODUODENOSCOPY N/A 01/27/2015   Procedure: ESOPHAGOGASTRODUODENOSCOPY (EGD);  Surgeon: Christena Deem, MD;  Location: [facility] ENDOSCOPY;  Service: Endoscopy;  Laterality: N/A;   ESOPHAGOGASTRODUODENOSCOPY (EGD) WITH PROPOFOL N/A 07/24/2015   Procedure: ESOPHAGOGASTRODUODENOSCOPY (EGD) WITH PROPOFOL;  Surgeon: Midge Minium, MD;  Location: [facility] ENDOSCOPY;  Service: Endoscopy;  Laterality: N/A;   ESOPHAGOGASTRODUODENOSCOPY (EGD) WITH PROPOFOL N/A 04/12/2018   Procedure: ESOPHAGOGASTRODUODENOSCOPY (EGD) WITH PROPOFOL;  Surgeon: Wyline Mood, MD;  Location: [facility] ENDOSCOPY;  Service: Gastroenterology;  Laterality: N/A;   ESOPHAGOGASTRODUODENOSCOPY (EGD) WITH PROPOFOL N/A 06/22/2018   Procedure: ESOPHAGOGASTRODUODENOSCOPY (EGD) WITH PROPOFOL;  Surgeon: Rachael Fee, MD;  Location: [facility] ENDOSCOPY;  Service: Endoscopy;  Laterality: N/A;   ESOPHAGOGASTRODUODENOSCOPY (EGD) WITH  PROPOFOL N/A 07/09/2018   Procedure: ESOPHAGOGASTRODUODENOSCOPY (EGD) WITH PROPOFOL with resection of gastric polyps;  Surgeon: Wyline Mood, MD;  Location: [facility] ENDOSCOPY;  Service: Gastroenterology;  Laterality: N/A;   ESOPHAGOGASTRODUODENOSCOPY (EGD) WITH PROPOFOL N/A 05/17/2019   Procedure: ESOPHAGOGASTRODUODENOSCOPY (EGD) WITH PROPOFOL;  Surgeon: Wyline Mood, MD;  Location: [facility] ENDOSCOPY;  Service: Gastroenterology;  Laterality: N/A;   EUS N/A 06/22/2018   Procedure: UPPER ENDOSCOPIC ULTRASOUND (EUS) RADIAL;  Surgeon: Rachael Fee, MD;  Location: [facility] ENDOSCOPY;  Service: Endoscopy;  Laterality: N/A;   EYE SURGERY Bilateral    cataract extractions   HUMERUS IM NAIL Left 06/02/2022   Procedure: INTRAMEDULLARY (IM) NAIL HUMERAL;  Surgeon: Christena Flake, MD;  Location: [facility] ORS;  Service: Orthopedics;  Laterality: Left;   JOINT REPLACEMENT Bilateral 2014   Bilateral Knee replacement   LEAD REVISION/REPAIR N/A 08/29/2017   Procedure: LEAD REVISION/REPAIR;  Surgeon: Regan Lemming, MD;  Location: [facility] INVASIVE CV LAB;  Service: Cardiovascular;  Laterality: N/A;   OOPHORECTOMY     PACEMAKER INSERTION Left 07/01/2015   Procedure: INSERTION PACEMAKER;  Surgeon: Marcina Millard, MD;  Location: [facility] ORS;  Service: Cardiovascular;  Laterality: Left;   PACEMAKER REVISION N/A 08/28/2017   Procedure: PACEMAKER REVISION;  Surgeon: Duke Salvia, MD;  Location: [facility] INVASIVE CV LAB;  Service: Cardiovascular;  Laterality: N/A;   REPLACEMENT TOTAL KNEE BILATERAL     SHOULDER ARTHROSCOPY WITH ROTATOR CUFF REPAIR AND OPEN BICEPS TENODESIS Left 11/21/2019   Procedure: LEFT SHOULDER ARTHROSCOPY WITH DEBRIDEMENT, DECOMPRESSION, ROTATOR CUFF REPAIR AND BICEPS TENOLYSIS;  Surgeon: Christena Flake, MD;  Location: [facility] ORS;  Service: Orthopedics;  Laterality: Left;   SHOULDER ARTHROSCOPY WITH SUBACROMIAL DECOMPRESSION Left 2013   TEE WITHOUT CARDIOVERSION N/A 06/28/2017   Procedure: TRANSESOPHAGEAL  ECHOCARDIOGRAM (TEE);  Surgeon: Lamar Blinks, MD;  Location: [facility] ORS;  Service: Cardiovascular;  Laterality: N/A;   Social History:  reports that she has never smoked. She has never used smokeless tobacco. She reports that she does not drink alcohol and does not use drugs.  Allergies  Allergen Reactions   Cephalosporins Other (See Comments), Diarrhea and Nausea Only    Reaction:  Unknown  Other reaction(s): Other (see comments) Other reaction(s): Other (See Comments) Other Reaction: Intolerance  Reaction:  Stomach pain  Other Reaction: Intolerance Reaction:  Unknown    Fish Allergy Shortness Of Breath    All kinds of fish. Severe vomitting even with smell of fish.    Macrolides And Ketolides Other (See Comments)    Severe stomach pain (mycins)   Meperidine Shortness Of Breath, Nausea Only and Other (See Comments)    Stomach pain    Other Nausea Only, Swelling, Rash, Anaphylaxis, Diarrhea, Shortness Of Breath and Other (See Comments)    Allergen: NON-STEROIDS, bextra - hands and feet swell  Reaction:  Stomach pain   Other Reaction: Intolerance   Prednisone Anaphylaxis and Other (See Comments)    (facial swelling/redness/burning)Increases pts blood sugar  Pt states that she is allergic to all steroids.     Shellfish Allergy Anaphylaxis, Shortness Of Breath, Diarrhea, Nausea Only and Rash    Stomach pain    Sulfa Antibiotics Shortness Of Breath, Diarrhea, Nausea Only and Other (See Comments)    Reaction:  Stomach pain   Reaction:  Stomach pain  Other Reaction: Intolerance   Sulfacetamide Sodium Diarrhea, Nausea Only, Other (See Comments) and Shortness Of Breath    Reaction:  Stomach pain    Telbivudine Other (See Comments)    Unknown reaction   Uloric [Febuxostat] Anaphylaxis    Locks pt's body up    Aspirin Rash and Other (See Comments)    Stomach pain (tolerates lower doses)   Celecoxib Other (See Comments)    GI bleed, weakness, and stomach pain.     Cephalexin  Diarrhea, Nausea Only and Other (See Comments)    stomach pain    Codeine Diarrhea, Nausea Only and Other (See Comments)    Reaction:  Stomach pain Pt tolerates morphine    Dilaudid [Hydromorphone Hcl] Other (See Comments)    Reactions: easy to overdose - blood pressure drops really low   Erythromycin Diarrhea, Nausea Only and Other (See Comments)    Reaction:  Stomach pain    Iodinated Contrast Media Rash and Other (See Comments)    Pt states that she is unable to have because she has chronic kidney disease.   CKD Pt states that she is unable to have because she has chronic kidney disease.   CKD    Lipitor [Atorvastatin Calcium] Nausea Only    Reaction: nausea, weakness, pass blood   Oxycodone Other (See Comments)  Reaction:  Stomach pain    Aleve [Naproxen]     Reaction: severe stomach pain   Atorvastatin Nausea Only and Other (See Comments)    Weakness    Doxycycline     Stomach pain    Iodine Rash   Motrin [Ibuprofen]     Reaction: severe stomach pain   Tape Other (See Comments)    Causes pts skin to tear  Pt states that she is able to use paper tape.       Valdecoxib Swelling and Other (See Comments)    Pt states that her hands and feet swell.      Family History  Problem Relation Age of Onset   Diabetes Mellitus II Mother    CAD Mother    Heart attack Mother    Cancer Father        skin   Diabetes Mellitus II Brother    Stroke Brother    Breast cancer Neg Hx     Prior to Admission medications   Medication Sig Start Date End Date Taking? Authorizing Provider  acetaminophen (TYLENOL) 325 MG tablet Take 2 tablets (650 mg total) by mouth every 6 (six) hours as needed for mild pain, moderate pain or fever (or Fever >/= 101). Patient taking differently: Take 325 mg by mouth every 6 (six) hours as needed for mild pain, moderate pain or fever (or Fever >/= 101). 06/08/22  Yes Lurene Shadow, MD  acetaminophen (TYLENOL) 500 MG tablet Take 500 mg by mouth in the  morning, at noon, and at bedtime.   Yes [provider]  albuterol (VENTOLIN HFA) 108 (90 Base) MCG/ACT inhaler Inhale 2 puffs into the lungs every 6 (six) hours as needed for wheezing or shortness of breath. Ventolin   Yes [provider]  allopurinol (ZYLOPRIM) 100 MG tablet Take 100 mg by mouth daily.   Yes [provider]  amiodarone (PACERONE) 200 MG tablet TAKE 1 TABLET BY MOUTH ONCE DAILY . APPOINTMENT REQUIRED FOR FUTURE REFILLS 02/14/22  Yes Duke Salvia, MD  apixaban (ELIQUIS) 2.5 MG TABS tablet Take 1 tablet (2.5 mg total) by mouth 2 (two) times daily. 03/06/22  Yes Sunnie Nielsen, DO  ciprofloxacin (CIPRO) 500 MG tablet Take 500 mg by mouth daily. 05/07 - 05/14   Yes [provider]  docusate sodium (COLACE) 100 MG capsule Take 100 mg by mouth 2 (two) times daily.   Yes [provider]  famotidine (PEPCID) 10 MG tablet Take 10 mg by mouth daily.   Yes [provider]  Fluticasone Furoate (FLONASE SENSIMIST NA) Place 1 spray into the nose daily.   Yes [provider]  furosemide (LASIX) 40 MG tablet Take 40 mg by mouth daily.   Yes [provider]  gabapentin (NEURONTIN) 100 MG capsule Take 100 mg by mouth 2 (two) times daily. 05/07 - 05/11   Yes [provider]  HUMALOG 100 UNIT/ML injection Inject 0-12 Units into the skin 3 (three) times daily with meals. Sliding scale 05/08/22  Yes [provider]  LANTUS 100 UNIT/ML injection 16 Units at bedtime. 05/10/22  Yes [provider]  Levothyroxine Sodium 37.5 MCG CAPS Take 37.5 mcg by mouth daily before breakfast. 01/05/23  Yes Duke Salvia, MD  midodrine (PROAMATINE) 5 MG tablet Take 5 mg by mouth 3 (three) times daily with meals. 01/18/23 07/17/23 Yes [provider]  oxybutynin (DITROPAN) 5 MG tablet Take 5 mg by mouth 3 (three) times daily. Breakfast & bedtime  Yes [provider]  Polyethyl Glycol-Propyl Glycol 0.4-0.3 %  SOLN Place 1 drop into both eyes 2 (two) times daily as needed (dry eyes).   Yes [provider]  pravastatin (PRAVACHOL) 20 MG tablet Take 20 mg by mouth at bedtime.  07/25/18  Yes [provider]  Prenat-FeCbn-FeBisg-FA-Omega (MULTIVITAMIN/MINERALS PO) Take 1 tablet by mouth daily.   Yes [provider]  rOPINIRole (REQUIP) 0.25 MG tablet Take 1 tablet (0.25 mg total) by mouth at bedtime. 06/08/22  Yes Lurene Shadow, MD  sertraline (ZOLOFT) 25 MG tablet Take 25 mg by mouth daily.   Yes [provider]  sertraline (ZOLOFT) 50 MG tablet Take 50 mg by mouth daily.   Yes [provider]  talc powder Apply 1 application topically as needed (under the breast irritation).   Yes [provider]  traZODone (DESYREL) 50 MG tablet Take 50 mg by mouth at bedtime as needed for sleep. 06/07/18  Yes [provider]  traZODone (DESYREL) 50 MG tablet Take 25 mg by mouth 2 (two) times daily as needed for sleep. Do not give within 2 hours of night dose   Yes [provider]  traZODone (DESYREL) 50 MG tablet Take 50 mg by mouth at bedtime.   Yes [provider]  vitamin B-12 (CYANOCOBALAMIN) 1000 MCG tablet Take 1,000 mcg by mouth daily.   Yes [provider]  alum & mag hydroxide-simeth (MAALOX/MYLANTA) 200-200-20 MG/5ML suspension Take 10 mLs by mouth 3 (three) times daily as needed for indigestion or heartburn.    [provider]  senna (SENOKOT) 8.6 MG TABS tablet Take 1 tablet by mouth every Monday, Wednesday, and Friday.    [provider]  Simethicone LIQD 10 mLs by Does not apply route 3 (three) times daily as needed. 200-200-20 mg/5 mL Patient not taking: Reported on 01/20/2023    [provider]    Physical Exam: Vitals:   01/20/23 1004 01/20/23 1200  BP: 138/62 118/67  Pulse: 78 62  Resp: (!) 22 (!) 24  Temp: 98.7 F (37.1 C)   TempSrc: Oral   SpO2: 100% 100%   Physical  Exam Constitutional:      Appearance: She is normal weight.     Comments: Mild distress secondary to pain  HENT:     Head: Normocephalic.     Nose: Nose normal.  Eyes:     Pupils: Pupils are equal, round, and reactive to light.  Cardiovascular:     Rate and Rhythm: Normal rate and regular rhythm.  Pulmonary:     Effort: Pulmonary effort is normal.  Abdominal:     General: Abdomen is flat. Bowel sounds are normal.  Musculoskeletal:     Cervical back: Normal range of motion and neck supple.     Comments: Positive right hip tenderness palpation  Skin:    General: Skin is warm.  Neurological:     General: No focal deficit present.  Psychiatric:        Mood and Affect: Mood normal.     Data Reviewed:  There are no new results to review at this time. DG Chest Portable 1 View CLINICAL DATA:  Tripped and fell, right hip pain  EXAM: PORTABLE CHEST 1 VIEW  COMPARISON:  06/03/2022  FINDINGS: Single frontal view of the chest demonstrates a stable dual lead pacemaker. Cardiac silhouette is unremarkable. No acute airspace disease, effusion, or pneumothorax. No acute displaced fracture. Chronic lower thoracic compression deformity and prior vertebral augmentation.  IMPRESSION:  1. No acute intrathoracic process.  Electronically Signed   By: Sharlet Salina M.D.   On: 01/20/2023 11:04 DG Hip Unilat W or Wo Pelvis 2-3 Views Right CLINICAL DATA:  Right hip pain  EXAM: DG HIP (WITH OR WITHOUT PELVIS) 2-3V RIGHT  COMPARISON:  04/17/2018  FINDINGS: Frontal view of the pelvis as well as frontal and frogleg lateral views of the right hip are obtained. There is a comminuted intertrochanteric right hip fracture, with impaction and varus angulation at the fracture site. No dislocation. Mild symmetrical bilateral hip osteoarthritis. Postsurgical changes at the lumbosacral junction.  IMPRESSION: 1. Comminuted intertrochanteric right hip fracture, with impaction and varus  angulation at the fracture site. 2. Mild symmetrical bilateral hip osteoarthritis.  Electronically Signed   By: Sharlet Salina M.D.   On: 01/20/2023 11:01  Lab Results  Component Value Date   WBC 5.6 01/20/2023   HGB 13.0 01/20/2023   HCT 39.2 01/20/2023   MCV 97.8 01/20/2023   PLT 160 01/20/2023   Last metabolic panel Lab Results  Component Value Date   GLUCOSE 173 (H) 01/20/2023   NA 136 01/20/2023   K 4.7 01/20/2023   CL 102 01/20/2023   CO2 21 (L) 01/20/2023   BUN 42 (H) 01/20/2023   CREATININE 1.83 (H) 01/20/2023   GFRNONAA 27 (L) 01/20/2023   CALCIUM 9.1 01/20/2023   PHOS 4.3 04/16/2018   PROT 7.3 01/20/2023   ALBUMIN 4.1 01/20/2023   BILITOT 1.7 (H) 01/20/2023   ALKPHOS 151 (H) 01/20/2023   AST 72 (H) 01/20/2023   ALT 37 01/20/2023   ANIONGAP 13 01/20/2023    Assessment and Plan: * Hip fracture (HCC) Mechanical fall w/ noted R hip fracture  Imaging showing: Comminuted intertrochanteric right hip fracture, with impaction and varus angulation at the fracture site Per Dr. Fuller Plan, Dr. Allena Katz with orthopedics aware of case  F/u formal ortho recs  NPO  Pain control (pt appears to have somewhat of a low pain threshold)  Follow    Fall Mechanical fall at skilled nursing facility with secondary right hip fracture No reported head trauma, loss of consciousness, weakness or dizziness prior to event Pending Ortho evaluation in the setting hip fracture Fall precautions PT OT eval Follow  Permanent atrial fibrillation (HCC) On eliquis and amiodarone  Hold eliquis pending ortho eval  Prn IV metoprolol for HR>110 pending restarting of oral regimen    Gastro-esophageal reflux disease without esophagitis PPI  CAD (coronary artery disease) No active CP  Cont home regimen postoperatively   Hypothyroidism On synthroid   Chronic heart failure with preserved ejection fraction (HCC) 2D ECHO 03/2018 w/ EF 60-65%  Euvolemic  Follow volume status     Hyperlipemia, mixed On pravachol    Gout On allopurinol    Chronic kidney disease, stage 4 (severe) (HCC) Baseline Cr 1.2-2 Cr 1.8 today  Gentle IVF hydration pending OR  Minimize nephrotoxic agents       Advance Care Planning:   Code Status: Full Code   Consults: Orthopedics w/ Dr. Allena Katz per report   Family Communication: No family at the bedside   Severity of Illness: The appropriate patient status for this patient is INPATIENT. Inpatient status is judged to be reasonable and necessary in order to provide the required intensity of service to ensure the patient's safety. The patient's presenting symptoms, physical exam findings, and initial radiographic and laboratory data in the context of their chronic comorbidities is felt to place them at high risk for further clinical  deterioration. Furthermore, it is not anticipated that the patient will be medically stable for discharge from the hospital within 2 midnights of admission.   * I certify that at the point of admission it is my clinical judgment that the patient will require inpatient hospital care spanning beyond 2 midnights from the point of admission due to high intensity of service, high risk for further deterioration and high frequency of surveillance required.*  Author: Floydene Flock, MD 01/20/2023 1:09 PM  For on call review [email or web address].

## 2023-01-20 NOTE — Assessment & Plan Note (Signed)
Patient reports recent diagnosis of UTI Prescribed Cipro from May 7 through May 14 Will give IV equivalent for now pending operative evaluation Follow

## 2023-01-20 NOTE — Assessment & Plan Note (Signed)
Mechanical fall w/ noted R hip fracture  Imaging showing: Comminuted intertrochanteric right hip fracture, with impaction and varus angulation at the fracture site Per Dr. Fuller Plan, Dr. Allena Katz with orthopedics aware of case  F/u formal ortho recs  NPO  Pain control (pt appears to have somewhat of a low pain threshold)  Follow

## 2023-01-20 NOTE — Assessment & Plan Note (Signed)
PPI ?

## 2023-01-20 NOTE — Assessment & Plan Note (Signed)
2D ECHO 03/2018 w/ EF 60-65%  Euvolemic  Follow volume status

## 2023-01-20 NOTE — Assessment & Plan Note (Signed)
On synthroid 

## 2023-01-20 NOTE — Assessment & Plan Note (Signed)
On eliquis and amiodarone  Hold eliquis pending ortho eval  Prn IV metoprolol for HR>110 pending restarting of oral regimen

## 2023-01-20 NOTE — Assessment & Plan Note (Signed)
No active CP  Cont home regimen postoperatively

## 2023-01-20 NOTE — Progress Notes (Signed)
PHARMACY NOTE:  ANTIMICROBIAL RENAL DOSAGE ADJUSTMENT  Current antimicrobial regimen includes a mismatch between antimicrobial dosage and estimated renal function.  As per policy approved by the Pharmacy & Therapeutics and Medical Executive Committees, the antimicrobial dosage will be adjusted accordingly.  Current antimicrobial dosage:  cipro 400 mg Q12H  Indication: UTI - POA  Renal Function:  Estimated Creatinine Clearance: 22.5 mL/min (A) (by C-G formula based on SCr of 1.83 mg/dL (H)).  Antimicrobial dosage has been changed to:  Cipro 400 mg Q24H  Additional comments:   Thank you for allowing pharmacy to be a part of this patient's care.  Sharen Hones, PharmD, BCPS Clinical Pharmacist   01/20/2023 1:37 PM

## 2023-01-20 NOTE — ED Triage Notes (Signed)
Pt comes via [facility] from [facility] with c/o trip and fall. Pt states right hip pain. Pt denies any loc or hitting head. Pt is on eliquis.

## 2023-01-20 NOTE — Plan of Care (Signed)

## 2023-01-20 NOTE — Assessment & Plan Note (Signed)
On allopurinol.  

## 2023-01-20 NOTE — ED Provider Notes (Signed)
[facility] Provider Note    Event Date/Time   First MD Initiated Contact with Patient 01/20/23 1001     (approximate)   History   Fall   HPI  Deborah Obrien is a 82 y.o. female who comes in from [place] [facility] who reports a mechanical fall when she tripped but has right hip pain.  Denies any LOC or hitting her head.  However patient is on Eliquis.  I did review patient's discharge summary where she has a history of A-fib on Eliquis, GERD, anxiety, anemia, CKD, diabetes who was previously admitted for left humerus fracture on discharge summary noted on 06/08/2022  Patient had a witnessed mechanical fall where she tripped on a walker and fell onto her right hip.  It was witnessed by someone who stated that she did not hit her head and patient also confirms she did not hit her head.  She denies hitting her neck either.  She only reports pain in her right hip.  She denies any other injuries.  She denies any chest pain, shortness of breath, loss of consciousness or any other concerns.     Physical Exam   Triage Vital Signs: ED Triage Vitals [01/20/23 1000]  Enc Vitals Group     BP      Pulse      Resp      Temp      Temp src      SpO2      Weight      Height      Head Circumference      Peak Flow      Pain Score 10     Pain Loc      Pain Edu?      Excl. in GC?     Most recent vital signs: Vitals:   01/20/23 1004 01/20/23 1200  BP: 138/62 118/67  Pulse: 78 62  Resp: (!) 22 (!) 24  Temp: 98.7 F (37.1 C)   SpO2: 100% 100%     General: Awake, no distress.  CV:  Good peripheral perfusion.  Resp:  Normal effort.  Abd:  No distention.  Other:  Pain noted in the right hip.  Good distal pulse.  Able to wiggle toes.  Sensation intact.  No C-spine tenderness no hematoma noted to the head.  Patient is alert answering questions appropriately   ED Results / Procedures / Treatments   Labs (all labs ordered are listed, but only abnormal  results are displayed) Labs Reviewed  CBC WITH DIFFERENTIAL/PLATELET  COMPREHENSIVE METABOLIC PANEL  PROTIME-INR  APTT     EKG  My interpretation of EKG:  Wandering baseline but appears sinus with a rate of 65 without any ST elevation or T wave inversions, normal intervals  RADIOLOGY I have reviewed the xray personally and interpreted and patient has right hip fracture. PROCEDURES:  Critical Care performed: No  .1-3 Lead EKG Interpretation  Performed by: Concha Se, MD Authorized by: Concha Se, MD     Interpretation: normal     ECG rate:  60   ECG rate assessment: normal     Rhythm: sinus rhythm     Ectopy: none     Conduction: normal      MEDICATIONS ORDERED IN ED: Medications  fentaNYL (SUBLIMAZE) injection 50 mcg (50 mcg Intravenous Given 01/20/23 1019)  ondansetron (ZOFRAN) injection 4 mg (4 mg Intravenous Given 01/20/23 1018)  fentaNYL (SUBLIMAZE) injection 75 mcg (75 mcg Intravenous Given 01/20/23  1121)  sodium chloride 0.9 % bolus 1,000 mL (1,000 mLs Intravenous New Bag/Given 01/20/23 1136)     IMPRESSION / MDM / ASSESSMENT AND PLAN / ED COURSE  I reviewed the triage vital signs and the nursing notes.   Patient's presentation is most consistent with acute presentation with potential threat to life or bodily function.   Patient comes in with concerns for mechanical fall onto right hip.  No evidence of trauma on her head.  Denies any chest pain, abdominal pain.  She was away witness fall and she is not have any dementia and is adamant she did not hit her head.  Will get x-rays evaluate for hip fracture given concerns for fracture, dislocation neurovascularly intact.  Patient given some fentanyl for pain.  Patient does have issues with low blood pressures with pain medications and given a liter of fluid.  Will get preop labs  CBC is reassuring.  Coags normal.  CMP similar creatinine  I discussed with Dr. Allena Katz who is aware of patient.  I discussed the  hospitalist for admission  The patient is on the cardiac monitor to evaluate for evidence of arrhythmia and/or significant heart rate changes.      FINAL CLINICAL IMPRESSION(S) / ED DIAGNOSES   Final diagnoses:  Fall, initial encounter  Closed fracture of right hip, initial encounter [facility])     Rx / DC Orders   ED Discharge Orders     None        Note:  This document was prepared using Dragon voice recognition software and may include unintentional dictation errors.   Concha Se, MD 01/20/23 1240

## 2023-01-20 NOTE — Assessment & Plan Note (Signed)
Mechanical fall at skilled nursing facility with secondary right hip fracture No reported head trauma, loss of consciousness, weakness or dizziness prior to event Pending Ortho evaluation in the setting hip fracture Fall precautions PT OT eval Follow

## 2023-01-20 NOTE — Assessment & Plan Note (Signed)
Baseline Cr 1.2-2 Cr 1.8 today  Gentle IVF hydration pending OR  Minimize nephrotoxic agents

## 2023-01-21 ENCOUNTER — Inpatient Hospital Stay: Payer: Medicare Other

## 2023-01-21 ENCOUNTER — Encounter
Admission: EM | Disposition: A | Discharge status: Skilled Nursing Facility | Payer: Self-pay | Source: Home / Self Care | Attending: Internal Medicine

## 2023-01-21 ENCOUNTER — Other Ambulatory Visit: Payer: Self-pay

## 2023-01-21 ENCOUNTER — Inpatient Hospital Stay: Payer: Medicare Other | Admitting: Anesthesiology

## 2023-01-21 DIAGNOSIS — E782 Mixed hyperlipidemia: Secondary | ICD-10-CM

## 2023-01-21 DIAGNOSIS — S72001S Fracture of unspecified part of neck of right femur, sequela: Secondary | ICD-10-CM | POA: Diagnosis not present

## 2023-01-21 DIAGNOSIS — N179 Acute kidney failure, unspecified: Secondary | ICD-10-CM | POA: Diagnosis not present

## 2023-01-21 DIAGNOSIS — I4821 Permanent atrial fibrillation: Secondary | ICD-10-CM | POA: Diagnosis not present

## 2023-01-21 DIAGNOSIS — N170 Acute kidney failure with tubular necrosis: Secondary | ICD-10-CM

## 2023-01-21 DIAGNOSIS — R110 Nausea: Secondary | ICD-10-CM | POA: Diagnosis not present

## 2023-01-21 DIAGNOSIS — S72001A Fracture of unspecified part of neck of right femur, initial encounter for closed fracture: Secondary | ICD-10-CM | POA: Insufficient documentation

## 2023-01-21 DIAGNOSIS — N184 Chronic kidney disease, stage 4 (severe): Secondary | ICD-10-CM

## 2023-01-21 DIAGNOSIS — R7401 Elevation of levels of liver transaminase levels: Secondary | ICD-10-CM

## 2023-01-21 DIAGNOSIS — I5032 Chronic diastolic (congestive) heart failure: Secondary | ICD-10-CM

## 2023-01-21 DIAGNOSIS — R579 Shock, unspecified: Secondary | ICD-10-CM | POA: Diagnosis not present

## 2023-01-21 DIAGNOSIS — I959 Hypotension, unspecified: Secondary | ICD-10-CM

## 2023-01-21 DIAGNOSIS — I9581 Postprocedural hypotension: Secondary | ICD-10-CM

## 2023-01-21 DIAGNOSIS — I2510 Atherosclerotic heart disease of native coronary artery without angina pectoris: Secondary | ICD-10-CM

## 2023-01-21 HISTORY — PX: INTRAMEDULLARY (IM) NAIL INTERTROCHANTERIC: SHX5875

## 2023-01-21 LAB — BLOOD GAS, ARTERIAL
Acid-base deficit: 6.3 mmol/L — ABNORMAL HIGH (ref 0.0–2.0)
Acid-base deficit: 6.1 mmol/L — ABNORMAL HIGH (ref 0.0–2.0)
Bicarbonate: 19.7 mmol/L — ABNORMAL LOW (ref 20.0–28.0)
Expiratory PAP: 5 cmH2O
O2 Content: 2 L/min
O2 Saturation: 95.8 %
O2 Saturation: 99.4 %
Patient temperature: 37
pCO2 arterial: 60 mmHg — ABNORMAL HIGH (ref 32–48)
pCO2 arterial: 35 mmHg (ref 32–48)

## 2023-01-21 LAB — CBC
HCT: 37.3 % (ref 36.0–46.0)
Hemoglobin: 11.6 g/dL — ABNORMAL LOW (ref 12.0–15.0)
MCH: 32 pg (ref 26.0–34.0)
MCHC: 31.1 g/dL (ref 30.0–36.0)
MCV: 103 fL — ABNORMAL HIGH (ref 80.0–100.0)
Platelets: 152 10*3/uL (ref 150–400)
RBC: 3.62 MIL/uL — ABNORMAL LOW (ref 3.87–5.11)
RDW: 13.8 % (ref 11.5–15.5)
WBC: 14.2 10*3/uL — ABNORMAL HIGH (ref 4.0–10.5)
nRBC: 0 % (ref 0.0–0.2)

## 2023-01-21 LAB — COMPREHENSIVE METABOLIC PANEL
ALT: 61 U/L — ABNORMAL HIGH (ref 0–44)
ALT: 58 U/L — ABNORMAL HIGH (ref 0–44)
AST: 98 U/L — ABNORMAL HIGH (ref 15–41)
AST: 84 U/L — ABNORMAL HIGH (ref 15–41)
Albumin: 3.5 g/dL (ref 3.5–5.0)
Albumin: 3.2 g/dL — ABNORMAL LOW (ref 3.5–5.0)
Alkaline Phosphatase: 156 U/L — ABNORMAL HIGH (ref 38–126)
Alkaline Phosphatase: 133 U/L — ABNORMAL HIGH (ref 38–126)
Anion gap: 7 (ref 5–15)
Anion gap: 8 (ref 5–15)
BUN: 44 mg/dL — ABNORMAL HIGH (ref 8–23)
BUN: 48 mg/dL — ABNORMAL HIGH (ref 8–23)
CO2: 25 mmol/L (ref 22–32)
CO2: 19 mmol/L — ABNORMAL LOW (ref 22–32)
Calcium: 8.3 mg/dL — ABNORMAL LOW (ref 8.9–10.3)
Calcium: 7.5 mg/dL — ABNORMAL LOW (ref 8.9–10.3)
Chloride: 107 mmol/L (ref 98–111)
Chloride: 108 mmol/L (ref 98–111)
Creatinine, Ser: 2.36 mg/dL — ABNORMAL HIGH (ref 0.44–1.00)
Creatinine, Ser: 2.48 mg/dL — ABNORMAL HIGH (ref 0.44–1.00)
GFR, Estimated: 20 mL/min — ABNORMAL LOW (ref >60)
GFR, Estimated: 19 mL/min — ABNORMAL LOW (ref >60)
Glucose, Bld: 274 mg/dL — ABNORMAL HIGH (ref 70–99)
Glucose, Bld: 284 mg/dL — ABNORMAL HIGH (ref 70–99)
Potassium: 5 mmol/L (ref 3.5–5.1)
Potassium: 4.5 mmol/L (ref 3.5–5.1)
Sodium: 139 mmol/L (ref 135–145)
Sodium: 135 mmol/L (ref 135–145)
Total Bilirubin: 1.5 mg/dL — ABNORMAL HIGH (ref 0.3–1.2)
Total Bilirubin: 1.4 mg/dL — ABNORMAL HIGH (ref 0.3–1.2)
Total Protein: 6.5 g/dL (ref 6.5–8.1)
Total Protein: 5.8 g/dL — ABNORMAL LOW (ref 6.5–8.1)

## 2023-01-21 LAB — HEMOGLOBIN A1C
Hgb A1c MFr Bld: 6.8 % — ABNORMAL HIGH (ref 4.8–5.6)
Mean Plasma Glucose: 148.46 mg/dL

## 2023-01-21 LAB — CBC WITH DIFFERENTIAL/PLATELET
Abs Immature Granulocytes: 0.07 10*3/uL (ref 0.00–0.07)
Basophils Absolute: 0 10*3/uL (ref 0.0–0.1)
Basophils Relative: 0 %
Eosinophils Absolute: 0 10*3/uL (ref 0.0–0.5)
Eosinophils Relative: 0 %
HCT: 32.5 % — ABNORMAL LOW (ref 36.0–46.0)
Hemoglobin: 10.3 g/dL — ABNORMAL LOW (ref 12.0–15.0)
Immature Granulocytes: 1 %
Lymphocytes Relative: 2 %
Lymphs Abs: 0.3 10*3/uL — ABNORMAL LOW (ref 0.7–4.0)
MCH: 32.2 pg (ref 26.0–34.0)
MCHC: 31.7 g/dL (ref 30.0–36.0)
MCV: 101.6 fL — ABNORMAL HIGH (ref 80.0–100.0)
Monocytes Absolute: 0.6 10*3/uL (ref 0.1–1.0)
Monocytes Relative: 4 %
Neutro Abs: 13.4 10*3/uL — ABNORMAL HIGH (ref 1.7–7.7)
Neutrophils Relative %: 93 %
Platelets: 172 10*3/uL (ref 150–400)
RBC: 3.2 MIL/uL — ABNORMAL LOW (ref 3.87–5.11)
RDW: 13.8 % (ref 11.5–15.5)
WBC: 14.4 10*3/uL — ABNORMAL HIGH (ref 4.0–10.5)
nRBC: 0 % (ref 0.0–0.2)

## 2023-01-21 LAB — MRSA NEXT GEN BY PCR, NASAL
MRSA by PCR Next Gen: NOT DETECTED
MRSA by PCR Next Gen: NOT DETECTED

## 2023-01-21 LAB — BRAIN NATRIURETIC PEPTIDE: B Natriuretic Peptide: 1487 pg/mL — ABNORMAL HIGH (ref 0.0–100.0)

## 2023-01-21 LAB — GLUCOSE, CAPILLARY
Glucose-Capillary: 230 mg/dL — ABNORMAL HIGH (ref 70–99)
Glucose-Capillary: 259 mg/dL — ABNORMAL HIGH (ref 70–99)
Glucose-Capillary: 261 mg/dL — ABNORMAL HIGH (ref 70–99)
Glucose-Capillary: 265 mg/dL — ABNORMAL HIGH (ref 70–99)
Glucose-Capillary: 273 mg/dL — ABNORMAL HIGH (ref 70–99)
Glucose-Capillary: 288 mg/dL — ABNORMAL HIGH (ref 70–99)
Glucose-Capillary: 290 mg/dL — ABNORMAL HIGH (ref 70–99)

## 2023-01-21 LAB — CORTISOL: Cortisol, Plasma: 32 ug/dL

## 2023-01-21 LAB — TROPONIN I (HIGH SENSITIVITY)
Troponin I (High Sensitivity): 45 ng/L — ABNORMAL HIGH (ref <18)
Troponin I (High Sensitivity): 52 ng/L — ABNORMAL HIGH (ref <18)
Troponin I (High Sensitivity): 61 ng/L — ABNORMAL HIGH (ref <18)

## 2023-01-21 LAB — HEMOGLOBIN AND HEMATOCRIT, BLOOD
HCT: 29.6 % — ABNORMAL LOW (ref 36.0–46.0)
Hemoglobin: 9.5 g/dL — ABNORMAL LOW (ref 12.0–15.0)

## 2023-01-21 LAB — LACTIC ACID, PLASMA
Lactic Acid, Venous: 1.5 mmol/L (ref 0.5–1.9)
Lactic Acid, Venous: 1.3 mmol/L (ref 0.5–1.9)

## 2023-01-21 LAB — FOLATE: Folate: 35 ng/mL (ref >5.9)

## 2023-01-21 LAB — MAGNESIUM: Magnesium: 1.7 mg/dL (ref 1.7–2.4)

## 2023-01-21 LAB — PHOSPHORUS: Phosphorus: 5.6 mg/dL — ABNORMAL HIGH (ref 2.5–4.6)

## 2023-01-21 LAB — TSH: TSH: 4.492 u[IU]/mL (ref 0.350–4.500)

## 2023-01-21 LAB — VITAMIN B12: Vitamin B-12: 2548 pg/mL — ABNORMAL HIGH (ref 180–914)

## 2023-01-21 LAB — T4, FREE: Free T4: 1.03 ng/dL (ref 0.61–1.12)

## 2023-01-21 LAB — PROCALCITONIN: Procalcitonin: 2.12 ng/mL

## 2023-01-21 LAB — PROTIME-INR
INR: 1.3 — ABNORMAL HIGH (ref 0.8–1.2)
Prothrombin Time: 16.4 seconds — ABNORMAL HIGH (ref 11.4–15.2)

## 2023-01-21 LAB — CK: Total CK: 268 U/L — ABNORMAL HIGH (ref 38–234)

## 2023-01-21 LAB — D-DIMER, QUANTITATIVE: D-Dimer, Quant: 2.78 ug/mL-FEU — ABNORMAL HIGH (ref 0.00–0.50)

## 2023-01-21 LAB — FIBRINOGEN: Fibrinogen: 413 mg/dL (ref 210–475)

## 2023-01-21 SURGERY — FIXATION, FRACTURE, INTERTROCHANTERIC, WITH INTRAMEDULLARY ROD
Anesthesia: General | Site: Hip | Laterality: Right

## 2023-01-21 MED ORDER — NALOXONE HCL 2 MG/2ML IJ SOSY
PREFILLED_SYRINGE | INTRAMUSCULAR | Status: AC
  Filled 2023-01-21: qty 2

## 2023-01-21 MED ORDER — POLYVINYL ALCOHOL 1.4 % OP SOLN: 1.0000 [drp] | Freq: Two times a day (BID) | OPHTHALMIC | Status: DC | PRN

## 2023-01-21 MED ORDER — FENTANYL CITRATE (PF) 100 MCG/2ML IJ SOLN
INTRAMUSCULAR | Status: AC
  Filled 2023-01-21: qty 2

## 2023-01-21 MED ORDER — CEFAZOLIN SODIUM-DEXTROSE 2-4 GM/100ML-% IV SOLN
INTRAVENOUS | Status: AC
  Filled 2023-01-21: qty 100

## 2023-01-21 MED ORDER — ONDANSETRON HCL 4 MG/2ML IJ SOLN
4.0000 mg | Freq: Once | INTRAMUSCULAR | Status: AC
  Administered 2023-01-21: 4 mg via INTRAVENOUS
  Filled 2023-01-21: qty 2

## 2023-01-21 MED ORDER — APIXABAN 2.5 MG PO TABS: 2.5000 mg | ORAL_TABLET | Freq: Two times a day (BID) | ORAL | Status: DC

## 2023-01-21 MED ORDER — TRAZODONE HCL 50 MG PO TABS
25.0000 mg | ORAL_TABLET | Freq: Two times a day (BID) | ORAL | Status: DC | PRN
  Administered 2023-01-22 – 2023-01-27 (×3): 25 mg via ORAL
  Filled 2023-01-21 (×4): qty 1

## 2023-01-21 MED ORDER — OXYCODONE HCL 5 MG PO TABS: 2.5000 mg | ORAL_TABLET | ORAL | Status: DC | PRN

## 2023-01-21 MED ORDER — MAGNESIUM SULFATE 2 GM/50ML IV SOLN
2.0000 g | Freq: Once | INTRAVENOUS | Status: AC
  Administered 2023-01-21: 2 g via INTRAVENOUS
  Filled 2023-01-21: qty 50

## 2023-01-21 MED ORDER — PROMETHAZINE HCL 25 MG/ML IJ SOLN: 6.2500 mg | INTRAMUSCULAR | Status: DC | PRN

## 2023-01-21 MED ORDER — ONDANSETRON HCL 4 MG/2ML IJ SOLN: 4.0000 mg | Freq: Four times a day (QID) | INTRAMUSCULAR | Status: DC | PRN

## 2023-01-21 MED ORDER — ROPINIROLE HCL 0.25 MG PO TABS
0.2500 mg | ORAL_TABLET | Freq: Every day | ORAL | Status: DC
  Administered 2023-01-22 – 2023-01-31 (×10): 0.25 mg via ORAL
  Filled 2023-01-21 (×11): qty 1

## 2023-01-21 MED ORDER — BUPIVACAINE HCL (PF) 0.5 % IJ SOLN
INTRAMUSCULAR | Status: AC
  Filled 2023-01-21: qty 30

## 2023-01-21 MED ORDER — KETAMINE HCL 10 MG/ML IJ SOLN
INTRAMUSCULAR | Status: DC | PRN
  Administered 2023-01-21: 50 mg via INTRAVENOUS

## 2023-01-21 MED ORDER — SENNOSIDES-DOCUSATE SODIUM 8.6-50 MG PO TABS
1.0000 | ORAL_TABLET | Freq: Every evening | ORAL | Status: DC | PRN
  Administered 2023-01-23 – 2023-01-31 (×4): 1 via ORAL
  Filled 2023-01-21 (×4): qty 1

## 2023-01-21 MED ORDER — 0.9 % SODIUM CHLORIDE (POUR BTL) OPTIME
TOPICAL | Status: DC | PRN
  Administered 2023-01-21: 500 mL

## 2023-01-21 MED ORDER — GABAPENTIN 100 MG PO CAPS
100.0000 mg | ORAL_CAPSULE | Freq: Two times a day (BID) | ORAL | Status: DC
  Administered 2023-01-22 – 2023-02-01 (×20): 100 mg via ORAL
  Filled 2023-01-21 (×20): qty 1

## 2023-01-21 MED ORDER — METOCLOPRAMIDE HCL 5 MG PO TABS: 5.0000 mg | ORAL_TABLET | Freq: Three times a day (TID) | ORAL | Status: DC | PRN

## 2023-01-21 MED ORDER — PHENYLEPHRINE HCL (PRESSORS) 10 MG/ML IV SOLN
INTRAVENOUS | Status: DC | PRN
  Administered 2023-01-21 (×2): 80 ug via INTRAVENOUS
  Administered 2023-01-21: 160 ug via INTRAVENOUS
  Administered 2023-01-21 (×4): 80 ug via INTRAVENOUS

## 2023-01-21 MED ORDER — SODIUM BICARBONATE 8.4 % IV SOLN
50.0000 meq | Freq: Once | INTRAVENOUS | Status: AC
  Administered 2023-01-21: 50 meq via INTRAVENOUS
  Filled 2023-01-21 (×2): qty 50

## 2023-01-21 MED ORDER — FAMOTIDINE 20 MG PO TABS: 10.0000 mg | ORAL_TABLET | Freq: Every day | ORAL | Status: DC

## 2023-01-21 MED ORDER — METOCLOPRAMIDE HCL 5 MG/ML IJ SOLN: 5.0000 mg | Freq: Three times a day (TID) | INTRAMUSCULAR | Status: DC | PRN

## 2023-01-21 MED ORDER — ONDANSETRON HCL 4 MG PO TABS: 4.0000 mg | ORAL_TABLET | Freq: Four times a day (QID) | ORAL | Status: DC | PRN

## 2023-01-21 MED ORDER — SEVOFLURANE IN SOLN
RESPIRATORY_TRACT | Status: AC
  Filled 2023-01-21: qty 250

## 2023-01-21 MED ORDER — TRAMADOL HCL 50 MG PO TABS
ORAL_TABLET | ORAL | Status: AC
  Filled 2023-01-21: qty 1

## 2023-01-21 MED ORDER — OXYBUTYNIN CHLORIDE 5 MG PO TABS
5.0000 mg | ORAL_TABLET | Freq: Three times a day (TID) | ORAL | Status: DC
  Administered 2023-01-22 – 2023-01-23 (×3): 5 mg via ORAL
  Filled 2023-01-21 (×5): qty 1

## 2023-01-21 MED ORDER — PRAVASTATIN SODIUM 20 MG PO TABS: 20.0000 mg | ORAL_TABLET | Freq: Every day | ORAL | Status: DC

## 2023-01-21 MED ORDER — KETAMINE HCL 50 MG/5ML IJ SOSY
PREFILLED_SYRINGE | INTRAMUSCULAR | Status: AC
  Filled 2023-01-21: qty 5

## 2023-01-21 MED ORDER — CEFAZOLIN SODIUM-DEXTROSE 2-4 GM/100ML-% IV SOLN
2.0000 g | Freq: Four times a day (QID) | INTRAVENOUS | Status: AC
  Administered 2023-01-21 – 2023-01-22 (×3): 2 g via INTRAVENOUS
  Filled 2023-01-21 (×2): qty 100

## 2023-01-21 MED ORDER — ACETAMINOPHEN 500 MG PO TABS: 1000.0000 mg | ORAL_TABLET | Freq: Three times a day (TID) | ORAL | Status: DC

## 2023-01-21 MED ORDER — CHLORHEXIDINE GLUCONATE CLOTH 2 % EX PADS
6.0000 | MEDICATED_PAD | Freq: Every day | CUTANEOUS | Status: DC
  Administered 2023-01-21 – 2023-01-28 (×8): 6 via TOPICAL

## 2023-01-21 MED ORDER — INSULIN ASPART 100 UNIT/ML IJ SOLN
INTRAMUSCULAR | Status: AC
  Filled 2023-01-21: qty 1

## 2023-01-21 MED ORDER — INSULIN ASPART 100 UNIT/ML IJ SOLN
7.0000 [IU] | Freq: Once | INTRAMUSCULAR | Status: AC
  Administered 2023-01-21: 7 [IU] via SUBCUTANEOUS

## 2023-01-21 MED ORDER — ACETAMINOPHEN 10 MG/ML IV SOLN
1000.0000 mg | Freq: Once | INTRAVENOUS | Status: DC | PRN
  Administered 2023-01-21: 1000 mg via INTRAVENOUS

## 2023-01-21 MED ORDER — BUPIVACAINE LIPOSOME 1.3 % IJ SUSP
INTRAMUSCULAR | Status: DC | PRN
  Administered 2023-01-21: 40 mL via INTRAMUSCULAR

## 2023-01-21 MED ORDER — SODIUM CHLORIDE 0.9 % IV BOLUS
1000.0000 mL | Freq: Once | INTRAVENOUS | Status: AC
  Administered 2023-01-21: 1000 mL via INTRAVENOUS

## 2023-01-21 MED ORDER — ONDANSETRON HCL 4 MG/2ML IJ SOLN
4.0000 mg | Freq: Once | INTRAMUSCULAR | Status: AC
  Administered 2023-01-21: 4 mg via INTRAVENOUS

## 2023-01-21 MED ORDER — VASOPRESSIN 20 UNITS/100 ML INFUSION FOR SHOCK
0.0000 [IU]/min | INTRAVENOUS | Status: DC
  Administered 2023-01-22: 0.03 [IU]/min via INTRAVENOUS
  Filled 2023-01-21 (×3): qty 100

## 2023-01-21 MED ORDER — SERTRALINE HCL 50 MG PO TABS
25.0000 mg | ORAL_TABLET | Freq: Every day | ORAL | Status: DC
  Administered 2023-01-23 – 2023-02-01 (×10): 25 mg via ORAL
  Filled 2023-01-21 (×11): qty 1

## 2023-01-21 MED ORDER — METHOCARBAMOL 500 MG PO TABS
500.0000 mg | ORAL_TABLET | Freq: Four times a day (QID) | ORAL | Status: DC | PRN
  Administered 2023-01-22 – 2023-01-31 (×14): 500 mg via ORAL
  Filled 2023-01-21 (×15): qty 1

## 2023-01-21 MED ORDER — DEXAMETHASONE SODIUM PHOSPHATE 10 MG/ML IJ SOLN
INTRAMUSCULAR | Status: DC | PRN
  Administered 2023-01-21: 5 mg via INTRAVENOUS

## 2023-01-21 MED ORDER — NOREPINEPHRINE 4 MG/250ML-% IV SOLN
INTRAVENOUS | Status: AC
  Filled 2023-01-21: qty 250

## 2023-01-21 MED ORDER — FAMOTIDINE IN NACL 20-0.9 MG/50ML-% IV SOLN
20.0000 mg | INTRAVENOUS | Status: DC
  Administered 2023-01-21 – 2023-01-22 (×2): 20 mg via INTRAVENOUS
  Filled 2023-01-21 (×2): qty 50

## 2023-01-21 MED ORDER — INSULIN ASPART 100 UNIT/ML IJ SOLN
0.0000 [IU] | INTRAMUSCULAR | Status: DC
  Administered 2023-01-21 (×3): 5 [IU] via SUBCUTANEOUS
  Administered 2023-01-21 – 2023-01-22 (×2): 3 [IU] via SUBCUTANEOUS
  Filled 2023-01-21 (×3): qty 1

## 2023-01-21 MED ORDER — NOREPINEPHRINE 16 MG/250ML-% IV SOLN
0.0000 ug/min | INTRAVENOUS | Status: DC
  Administered 2023-01-21: 12 ug/min via INTRAVENOUS
  Administered 2023-01-22: 25 ug/min via INTRAVENOUS
  Administered 2023-01-23: 4 ug/min via INTRAVENOUS
  Filled 2023-01-21 (×3): qty 250

## 2023-01-21 MED ORDER — FENTANYL CITRATE (PF) 100 MCG/2ML IJ SOLN
INTRAMUSCULAR | Status: DC | PRN
  Administered 2023-01-21 (×3): 25 ug via INTRAVENOUS

## 2023-01-21 MED ORDER — HYDROMORPHONE HCL 1 MG/ML IJ SOLN: 0.2500 mg | INTRAMUSCULAR | Status: DC | PRN

## 2023-01-21 MED ORDER — LIDOCAINE HCL (CARDIAC) PF 100 MG/5ML IV SOSY
PREFILLED_SYRINGE | INTRAVENOUS | Status: DC | PRN
  Administered 2023-01-21: 80 mg via INTRAVENOUS

## 2023-01-21 MED ORDER — TRAMADOL HCL 50 MG PO TABS
25.0000 mg | ORAL_TABLET | Freq: Once | ORAL | Status: AC | PRN
  Administered 2023-01-21: 25 mg via ORAL

## 2023-01-21 MED ORDER — ACETAMINOPHEN 10 MG/ML IV SOLN
INTRAVENOUS | Status: AC
  Filled 2023-01-21: qty 100

## 2023-01-21 MED ORDER — NOREPINEPHRINE 4 MG/250ML-% IV SOLN
INTRAVENOUS | Status: DC | PRN
  Administered 2023-01-21: 4 ug/min via INTRAVENOUS

## 2023-01-21 MED ORDER — OXYCODONE HCL 5 MG PO TABS
5.0000 mg | ORAL_TABLET | ORAL | Status: DC | PRN
  Filled 2023-01-21: qty 1

## 2023-01-21 MED ORDER — DROPERIDOL 2.5 MG/ML IJ SOLN: 0.6250 mg | Freq: Once | INTRAMUSCULAR | Status: DC | PRN

## 2023-01-21 MED ORDER — CIPROFLOXACIN IN D5W 400 MG/200ML IV SOLN
INTRAVENOUS | Status: AC
  Filled 2023-01-21: qty 200

## 2023-01-21 MED ORDER — TRAZODONE HCL 50 MG PO TABS: 50.0000 mg | ORAL_TABLET | Freq: Every evening | ORAL | Status: DC | PRN

## 2023-01-21 MED ORDER — NALOXONE HCL 0.4 MG/ML IJ SOLN
20.0000 ug | INTRAMUSCULAR | Status: DC | PRN
  Administered 2023-01-21: 0.02 mg via INTRAVENOUS
  Filled 2023-01-21: qty 1

## 2023-01-21 MED ORDER — PROPOFOL 10 MG/ML IV BOLUS
INTRAVENOUS | Status: AC
  Filled 2023-01-21: qty 20

## 2023-01-21 MED ORDER — VITAMIN B-12 1000 MCG PO TABS
1000.0000 ug | ORAL_TABLET | Freq: Every day | ORAL | Status: DC
  Administered 2023-01-23 – 2023-02-01 (×10): 1000 ug via ORAL
  Filled 2023-01-21 (×10): qty 1

## 2023-01-21 MED ORDER — ONDANSETRON HCL 4 MG/2ML IJ SOLN
INTRAMUSCULAR | Status: AC
  Filled 2023-01-21: qty 2

## 2023-01-21 MED ORDER — FENTANYL CITRATE (PF) 100 MCG/2ML IJ SOLN
25.0000 ug | INTRAMUSCULAR | Status: DC | PRN
  Administered 2023-01-21 (×5): 25 ug via INTRAVENOUS

## 2023-01-21 MED ORDER — SUGAMMADEX SODIUM 200 MG/2ML IV SOLN
INTRAVENOUS | Status: DC | PRN
  Administered 2023-01-21: 150 mg via INTRAVENOUS

## 2023-01-21 MED ORDER — DOCUSATE SODIUM 100 MG PO CAPS
100.0000 mg | ORAL_CAPSULE | Freq: Two times a day (BID) | ORAL | Status: DC
  Administered 2023-01-22 – 2023-02-01 (×17): 100 mg via ORAL
  Filled 2023-01-21 (×19): qty 1

## 2023-01-21 MED ORDER — ACETAMINOPHEN 325 MG PO TABS
325.0000 mg | ORAL_TABLET | Freq: Four times a day (QID) | ORAL | Status: DC | PRN
  Administered 2023-01-23 – 2023-02-01 (×12): 650 mg via ORAL
  Filled 2023-01-21 (×11): qty 2

## 2023-01-21 MED ORDER — ROCURONIUM BROMIDE 100 MG/10ML IV SOLN
INTRAVENOUS | Status: DC | PRN
  Administered 2023-01-21: 40 mg via INTRAVENOUS

## 2023-01-21 MED ORDER — SERTRALINE HCL 50 MG PO TABS
50.0000 mg | ORAL_TABLET | Freq: Every day | ORAL | Status: DC
  Administered 2023-01-23 – 2023-02-01 (×10): 50 mg via ORAL
  Filled 2023-01-21 (×10): qty 1

## 2023-01-21 MED ORDER — SODIUM CHLORIDE 0.9 % IV SOLN: INTRAVENOUS | Status: DC

## 2023-01-21 MED ORDER — BISACODYL 10 MG RE SUPP
10.0000 mg | Freq: Every day | RECTAL | Status: DC | PRN
  Filled 2023-01-21 (×2): qty 1

## 2023-01-21 MED ORDER — NOREPINEPHRINE 4 MG/250ML-% IV SOLN
2.0000 ug/min | INTRAVENOUS | Status: DC
  Administered 2023-01-21: 2 ug/min via INTRAVENOUS
  Filled 2023-01-21: qty 250

## 2023-01-21 MED ORDER — METHOCARBAMOL 1000 MG/10ML IJ SOLN
500.0000 mg | Freq: Four times a day (QID) | INTRAVENOUS | Status: DC | PRN
  Administered 2023-01-21 – 2023-01-23 (×2): 500 mg via INTRAVENOUS
  Filled 2023-01-21 (×2): qty 500

## 2023-01-21 MED ORDER — FLEET ENEMA 7-19 GM/118ML RE ENEM: 1.0000 | ENEMA | Freq: Once | RECTAL | Status: DC | PRN

## 2023-01-21 SURGICAL SUPPLY — 52 items
BIT DRILL INTERTAN LAG SCREW (BIT) IMPLANT
BIT DRILL LONG 4.0 (BIT) IMPLANT
BLADE SURG 10 STRL SS (BLADE) IMPLANT
BLADE SURG 15 STRL LF DISP TIS (BLADE) ×1 IMPLANT
BLADE SURG 15 STRL SS (BLADE) ×1
CHLORAPREP W/TINT 26 (MISCELLANEOUS) ×1 IMPLANT
DRAPE 3/4 80X56 (DRAPES) ×1 IMPLANT
DRAPE C-ARM XRAY 36X54 (DRAPES) IMPLANT
DRAPE C-ARMOR (DRAPES) IMPLANT
DRAPE INCISE 23X17 STRL (DRAPES) IMPLANT
DRAPE INCISE IOBAN 23X17 STRL (DRAPES) ×1 IMPLANT
DRAPE ORTHO SPLIT 77X108 STRL (DRAPES) ×2
DRAPE SURG 17X11 SM STRL (DRAPES) ×2 IMPLANT
DRAPE SURG ORHT 6 SPLT 77X108 (DRAPES) IMPLANT
DRAPE U-SHAPE 47X51 STRL (DRAPES) ×2 IMPLANT
DRILL BIT LONG 4.0 (BIT) ×1
DRSG OPSITE POSTOP 3X4 (GAUZE/BANDAGES/DRESSINGS) ×3 IMPLANT
ELECT REM PT RETURN 9FT ADLT (ELECTROSURGICAL) ×1
ELECTRODE REM PT RTRN 9FT ADLT (ELECTROSURGICAL) ×1 IMPLANT
GLOVE BIOGEL PI IND STRL 8 (GLOVE) ×1 IMPLANT
GLOVE SURG SYN 7.5  E (GLOVE) ×1
GLOVE SURG SYN 7.5 E (GLOVE) ×1 IMPLANT
GLOVE SURG SYN 7.5 PF PI (GLOVE) ×1 IMPLANT
GOWN STRL REUS W/ TWL LRG LVL3 (GOWN DISPOSABLE) ×1 IMPLANT
GOWN STRL REUS W/ TWL XL LVL3 (GOWN DISPOSABLE) ×1 IMPLANT
GOWN STRL REUS W/TWL LRG LVL3 (GOWN DISPOSABLE) ×1
GOWN STRL REUS W/TWL XL LVL3 (GOWN DISPOSABLE) ×1
GUIDE PIN 3.2X343 (PIN) ×2
GUIDE PIN 3.2X343MM (PIN) ×2
KIT PATIENT CARE HANA TABLE (KITS) ×1 IMPLANT
KIT TURNOVER KIT A (KITS) ×1 IMPLANT
MANIFOLD NEPTUNE II (INSTRUMENTS) ×1 IMPLANT
MAT ABSORB  FLUID 56X50 GRAY (MISCELLANEOUS) ×2
MAT ABSORB FLUID 56X50 GRAY (MISCELLANEOUS) ×2 IMPLANT
NAIL TRIGEN INTERTAN 10X18CM (Nail) IMPLANT
NDL FILTER BLUNT 18X1 1/2 (NEEDLE) ×1 IMPLANT
NDL HYPO 22X1.5 SAFETY MO (MISCELLANEOUS) ×1 IMPLANT
NEEDLE FILTER BLUNT 18X1 1/2 (NEEDLE) IMPLANT
NEEDLE HYPO 22X1.5 SAFETY MO (MISCELLANEOUS) ×1 IMPLANT
NS IRRIG 1000ML POUR BTL (IV SOLUTION) ×1 IMPLANT
NS IRRIG 500ML POUR BTL (IV SOLUTION) IMPLANT
PACK HIP COMPR (MISCELLANEOUS) ×1 IMPLANT
PIN GUIDE 3.2X343MM (PIN) IMPLANT
SCREW LAG COMPR KIT 100/95 (Screw) IMPLANT
SCREW TRIGEN LOW PROF 5.0X32.5 (Screw) IMPLANT
STAPLER SKIN PROX 35W (STAPLE) ×1 IMPLANT
SUT VIC AB 2-0 CT2 27 (SUTURE) ×1 IMPLANT
SYR 10ML LL (SYRINGE) ×1 IMPLANT
SYR 30ML LL (SYRINGE) ×1 IMPLANT
TAPE CLOTH 3X10 WHT NS LF (GAUZE/BANDAGES/DRESSINGS) ×2 IMPLANT
TRAP FLUID SMOKE EVACUATOR (MISCELLANEOUS) ×1 IMPLANT
WATER STERILE IRR 500ML POUR (IV SOLUTION) ×1 IMPLANT

## 2023-01-21 NOTE — Anesthesia Procedure Notes (Signed)
Arterial Line Insertion Start/End5/07/2023 8:20 AM, 01/21/2023 8:25 AM Performed by: Foye Deer, MD, anesthesiologist  Patient location: Pre-op. Preanesthetic checklist: patient identified, IV checked, site marked, risks and benefits discussed, surgical consent, monitors and equipment checked, pre-op evaluation, timeout performed and anesthesia consent Lidocaine 1% used for infiltration and patient sedated Left, radial was placed Catheter size: 20 G Hand hygiene performed  and maximum sterile barriers used   Attempts: 1 Procedure performed using ultrasound guided technique. Ultrasound Notes:anatomy identified, needle tip was noted to be adjacent to the nerve/plexus identified and no ultrasound evidence of intravascular and/or intraneural injection Following insertion, dressing applied. Post procedure assessment: normal and unchanged  Patient tolerated the procedure well with no immediate complications.

## 2023-01-21 NOTE — Assessment & Plan Note (Addendum)
On amiodarone and eliquis. 

## 2023-01-21 NOTE — Hospital Course (Addendum)
82 y.o. female with medical history significant of atrial fibrillation on Eliquis, GERD, anxiety, anemia, stage IV CKD, diabetes mellitus, hypertension, hypothyroidism, obesity, restless leg syndrome presenting with fall and right hip fracture.  Patient resident of local skilled nursing facility.  Husband is currently at another skilled nursing facility in the setting of vascular dementia per patient.  Patient reports attempting to go to a medical visit with her husband.  Patient uses walker at baseline.  Patient left the building then turned back in to get her phone when she tripped over her walker landing on her right hip.  No reported head trauma loss conscious.  Patient denies any weakness or dizziness prior to fall.  No chest pain or shortness of breath.  No nausea or vomiting.  Has had significant pain after the fall.  Noted mission back in September 2023 with humerus fracture status post operative repair.  On Coumadin in the setting of Eliquis. Presented to the ER afebrile, hemodynamically stable.  White count 5.6, hemoglobin 13, creatinine 1.8, glucose 173, alk phos 151, T. bili 1.7.  Hip plain films with comminuted intertrochanteric right hip fracture with impaction and varus angulation at the fracture site.  Chest x-ray within normal limits.   5/11.  Patient seen in the morning complained of nausea and given Zofran.  Patient went to the operating room and became hypotensive requiring pressors.  ICU team consulted and will take over care.  She underwent intramedullary nailing of right femur with cephalomedullary device by Dr Allena Katz on 01/21/2023 patient was transferred to ICU for hypovolemia shock. She required IV vasopressors-- now weaned off.  Diagnosed with covid on 5/12 and covid isolation ended on 5/22 and can go out to rehab.

## 2023-01-21 NOTE — H&P (Signed)
H&P reviewed. No significant changes noted.  

## 2023-01-21 NOTE — Op Note (Signed)
DATE OF SURGERY: 01/21/2023  PREOPERATIVE DIAGNOSIS: Right intertrochanteric hip fracture  POSTOPERATIVE DIAGNOSIS: Right intertrochanteric hip fracture  PROCEDURE: Intramedullary nailing of Right femur with cephalomedullary device  SURGEON: Rosealee Albee, MD  ANESTHESIA: Gen  EBL: 100 cc  IVF: per anesthesia record  COMPONENTS:  [employer] Trigen Intertan Short Nail: 10x163mm; lag screw with 95mm compression screw; 5 x 32.33mm distal cortical interlocking screw  INDICATIONS: Deborah Obrien is a 82 y.o. female who sustained an intertrochanteric fracture after a fall. Risks and benefits of intramedullary nailing were explained to the patient and/or family . Risks include but are not limited to bleeding, infection, injury to tissues, nerves, vessels, nonunion/malunion, hardware failure, limb length discrepancy/hip rotation mismatch and risks of anesthesia. The patient and/or family understand these risks, have completed an informed consent, and wish to proceed.   PROCEDURE:  The patient was brought into the operating room. After administering anesthesia, the patient was placed in the supine position on the Hana table. The uninjured leg was placed in an extended position while the injured lower extremity was placed in longitudinal traction. The fracture was reduced using longitudinal traction and internal rotation. The adequacy of reduction was verified fluoroscopically in AP and lateral projections and found to be acceptable. The lateral aspect of the hip and thigh were prepped with ChloraPrep solution before being draped sterilely. Preoperative IV antibiotics were administered. A timeout was performed to verify the appropriate surgical site, patient, and procedure.    The greater trochanter was identified and an approximately 6 cm incision was made about 3 fingerbreadths above the tip of the greater trochanter. The incision was carried down through the subcutaneous tissues to  expose the gluteal fascia. This was split the length of the incision, providing access to the tip of the trochanter. Under fluoroscopic guidance, a guidewire was drilled through the tip of the trochanter into the proximal metaphysis to the level of the lesser trochanter. After verifying its position fluoroscopically in AP and lateral projections, it was overreamed with the opening reamer to the level of the lesser trochanter. The nail was selected and advanced to the appropriate depth as verified fluoroscopically.    The guide system for the lag screw was positioned and advanced through an approximately 5cm incision over the lateral aspect of the proximal femur. The guidewire was drilled up through the femoral nail and into the femoral neck to rest within 5 mm of subchondral bone. After verifying its position in the femoral neck and head in both AP and lateral projections, the guidewire was measured and appropriate sized lag screw was selected.  The channel for the compression screw was drilled and antirotation bar was placed.  Lag screw was drilled and placed in appropriate position.  Compression screw was then placed.  Appropriate compression was achieved.  The set screw was locked in place. Again, the adequacy of hardware position and fracture reduction was verified fluoroscopically in AP and lateral projections.   Attention was then turned to the distal interlocking screw in the diaphysis. Using a targeted assembly, a stab incision was made and hole was drilled through the nail. An interlocking screw was placed with excellent purchase.  Appropriate screw position was verified fluoroscopically in AP and lateral projections.   The wounds were irrigated thoroughly with sterile saline solution. Local anesthetic was injected into the wounds. The subcutaneous tissues were closed using 2-0 Vicryl interrupted sutures. The skin was closed using staples. Sterile occlusive dressings were applied to all wounds. The  patient was then transferred to the recovery room in satisfactory condition.   POSTOPERATIVE PLAN: The patient will be WBAT on the operative extremity. May resume baseline Eliquis on POD#1. Perioperative IV antibiotics x 24 hours. PT/OT on POD#1.

## 2023-01-21 NOTE — Progress Notes (Signed)
Progress Note   Patient: Deborah Obrien:[id number] DOB: 14-Aug-1941 DOA: 01/20/2023     1 DOS: the patient was seen and examined on 01/21/2023   Brief hospital course: 82 y.o. female with medical history significant of atrial fibrillation on Eliquis, GERD, anxiety, anemia, stage IV CKD, diabetes mellitus, hypertension, hypothyroidism, obesity, restless leg syndrome presenting with fall and right hip fracture.  Patient resident of local skilled nursing facility.  Husband is currently at another skilled nursing facility in the setting of vascular dementia per patient.  Patient reports attempting to go to a medical visit with her husband.  Patient uses walker at baseline.  Patient left the building then turned back in to get her phone when she tripped over her walker landing on her right hip.  No reported head trauma loss conscious.  Patient denies any weakness or dizziness prior to fall.  No chest pain or shortness of breath.  No nausea or vomiting.  Has had significant pain after the fall.  Noted mission back in September 2023 with humerus fracture status post operative repair.  On Coumadin in the setting of Eliquis. Presented to the ER afebrile, hemodynamically stable.  White count 5.6, hemoglobin 13, creatinine 1.8, glucose 173, alk phos 151, T. bili 1.7.  Hip plain films with comminuted intertrochanteric right hip fracture with impaction and varus angulation at the fracture site.  Chest x-ray within normal limits.   5/11.  Patient seen in the morning complained of nausea and given Zofran.  Patient went to the operating room and became hypotensive requiring pressors.  ICU team consulted and will take over care.  Assessment and Plan: * Shock (HCC) Could be cardiogenic versus hypovolemic.  Transferred to ICU service for pressors.  Nausea Patient received Zofran this morning  Closed hip fracture requiring operative repair, right, sequela Went to the operating room today and had intramedullary  nailing of the right femur with cephalomedullary device.  Permanent atrial fibrillation (HCC) Eliquis held for procedure.  On amiodarone.   Gastro-esophageal reflux disease without esophagitis PPI  CAD (coronary artery disease) No chest pain this morning  Hypothyroidism On synthroid   Acute renal failure superimposed on stage 4 chronic kidney disease (HCC) Creatinine up to 2.36 this morning.  Yesterday's creatinine 1.83.  On IV fluid.  Transaminitis Likely from low blood pressure  Chronic heart failure with preserved ejection fraction (HCC) No signs of heart failure this morning   UTI (urinary tract infection) Patient reports recent diagnosis of UTI Prescribed Cipro from May 7 through May 14   Hyperlipemia, mixed On pravachol    Gout On allopurinol          Subjective: Patient seen this morning prior to surgery and was complaining of nauseousness.  No chest pain or shortness of breath.  Some hip pain.  Admitted with hip fracture.  Physical Exam: Vitals:   01/21/23 1145 01/21/23 1200 01/21/23 1210 01/21/23 1215  BP: (!) 97/30 (!) 98/34  (!) 110/42  Pulse: (!) 59 (!) 59 61 63  Resp: 14 15  (!) 21  Temp:      TempSrc:      SpO2: 100% 100% 100% 100%  Weight:       Physical Exam HENT:     Head: Normocephalic.     Mouth/Throat:     Pharynx: No oropharyngeal exudate.  Eyes:     General: Lids are normal.     Conjunctiva/sclera: Conjunctivae normal.  Cardiovascular:     Rate and Rhythm: Normal rate and  regular rhythm.     Heart sounds: Normal heart sounds, S1 normal and S2 normal.  Pulmonary:     Breath sounds: Examination of the right-lower field reveals decreased breath sounds. Examination of the left-lower field reveals decreased breath sounds. Decreased breath sounds present. No wheezing, rhonchi or rales.  Abdominal:     Palpations: Abdomen is soft.     Tenderness: There is no abdominal tenderness.  Musculoskeletal:     Right lower leg: No  swelling.     Left lower leg: No swelling.  Skin:    General: Skin is warm.     Findings: No rash.  Neurological:     Mental Status: She is alert and oriented to person, place, and time.     Data Reviewed:  Blood cell count 14.4, hemoglobin 10.3, platelet count 172, INR 1.3, phosphorus 5.6, magnesium 1.7, creatinine 2.36, ABG shows a pH of 7.19  Family Communication: Updated patient's son on the phone about going to the critical care unit.  Disposition: Status is: Inpatient Remains inpatient appropriate because: Transferring to ICU service secondary to shock after surgery.  Planned Discharge Destination: Rehab    Time spent: 27 minutes Case discussed with critical care team  Author: Alford Highland, MD 01/21/2023 1:34 PM  For on call review [email or web address].

## 2023-01-21 NOTE — Assessment & Plan Note (Addendum)
stable °

## 2023-01-21 NOTE — Assessment & Plan Note (Addendum)
resolved 

## 2023-01-21 NOTE — Progress Notes (Signed)
OT Cancellation Note  Patient Details Name: Deborah Obrien MRN: [id number] DOB: 1941-03-15   Cancelled Treatment:    Reason Eval/Treat Not Completed: Patient not medically ready. Received orders, reviewed chart. Pt currently in surgery for repair of R hip fx following a fall at [facility], the assisted living facility where she resides. Per protocol, hold OT evaluation for POD 1 after surgical repair. MD aware and will place new therapy orders post surgery. Will evaluate pt 01/22/23.  Latina Craver 01/21/2023, 9:14 AM

## 2023-01-21 NOTE — Assessment & Plan Note (Signed)
On allopurinol.  

## 2023-01-21 NOTE — Assessment & Plan Note (Signed)
On pravachol

## 2023-01-21 NOTE — Progress Notes (Signed)
PT Cancellation Note  Patient Details Name: Deborah Obrien MRN: [id number] DOB: 1940/10/02   Cancelled Treatment:    Reason Eval/Treat Not Completed: Patient not medically ready PT orders received, chart reviewed. Pt admitted following a fall resulting in traumatic injury/fx. Pt scheduled for sx today at 8am. Per protocol, will plan to see pt for PT evaluation on POD1 following traumatic injury & surgical repair. MD made aware & requested to place new therapy orders post op.  Aleda Grana, PT, DPT 01/21/23, 7:08 AM   Sandi Mariscal 01/21/2023, 7:07 AM

## 2023-01-21 NOTE — Anesthesia Procedure Notes (Addendum)
Central Venous Catheter Insertion Performed by: Foye Deer, MD, anesthesiologist Start/End5/07/2023 1:00 PM, 01/21/2023 1:11 AM Patient location: Pre-op. Preanesthetic checklist: patient identified, IV checked, risks and benefits discussed, surgical consent, monitors and equipment checked, pre-op evaluation, timeout performed and anesthesia consent Position: Trendelenburg Lidocaine 1% used for infiltration and patient sedated Hand hygiene performed  and maximum sterile barriers used  Catheter size: 7 Fr Total catheter length 20. Central line was placed.Triple lumen Procedure performed using ultrasound guided technique. Ultrasound Notes:anatomy identified, needle tip was noted to be adjacent to the nerve/plexus identified and no ultrasound evidence of intravascular and/or intraneural injection Attempts: 1 Following insertion, dressing applied, line sutured and Biopatch. Post procedure assessment: blood return through all ports  Patient tolerated the procedure well with no immediate complications.

## 2023-01-21 NOTE — Consult Note (Addendum)
NAME:  Deborah Obrien, MRN:  [id number], DOB:  1941/06/30, LOS: 1 ADMISSION DATE:  01/20/2023, CONSULTATION DATE: 01/21/2023 REFERRING MD: Dr. Laural Benes, CHIEF COMPLAINT: Hypotension    History of Present Illness:  This is an 82 yo female who presented to [facility] ER on 05/10 via EMS from [facility] with right hip pain after tripping and having a witness fall.  She denied hitting her head or LOC.  Pt does take eliquis for atrial fibrillation.  At baseline she ambulates with a walker.    ED Course  Upon arrival to the ER pt hypotensive with maps in the low 60's.  Right hip x-ray revealed comminuted intertrochanteric right hip fracture, with impaction and varus angulation at the fracture site.  Orthopedic Surgeon Dr. Allena Katz consulted, and pt admitted to the [facility] unit per hospitalist team pending right hip cephalomedullary nailing scheduled for 05/11.    ED significant lab results: CO2 21/glucose 173/BUN 42/creatinine 1.83/alk phos 151/AST 72/total bilirubin 1.7 ED meds: fentanyl (125 mcg)/1L NS bolus/zofran   Pertinent  Medical History  Paroxysmal Atrial Fibrillation on eliquis  GERD  Anemia  Anxiety Bowel Obstruction  CAD  Stage IV CKD Depression  Type II Diabetes Mellitus Gastritis  GI Bleed  HTN  Hypothyroidism  Chronic Back Pain  Morbid Obesity  Permanent Cardiac Pacermaker  Sinus Node Dysfunction  Stroke  TIA   Significant Hospital Events: Including procedures, antibiotic start and stop dates in addition to other pertinent events   05/10: Pt admitted to the [facility] unit with a right intertrochanteric hip fracture following a mechanical fall  05/11: Pt underwent intramedullary nailing of the right femur with cephalomedullary device. Postop pt hypotensive requiring levophed gtt. Pt also developed acute hypercapnic respiratory failure requiring Bipap. PCCM team consulted to assist with management  Interim History / Subjective:  Pt remains hypotensive map 56-59 with  levophed gtt infusing @8  mcg/ min.  She is currently moaning and groaning in pain.    Objective   Blood pressure (!) 89/37, pulse (!) 59, temperature 99.7 F (37.6 C), resp. rate 16, weight 72 kg, SpO2 96 %.        Intake/Output Summary (Last 24 hours) at 01/21/2023 1111 Last data filed at 01/21/2023 1103 Gross per 24 hour  Intake 410.97 ml  Output 250 ml  Net 160.97 ml   Filed Weights   01/20/23 1310  Weight: 72 kg    Examination: General: Acute on chronically appearing female, groaning in pain  HENT: Supple, no JVD  Lungs: Clear throughout, even, non labored  Cardiovascular: Atrial paced, hr 60's, no m/r/g, 2+ radial/1+ distal pulses, 1+ right hip edema  Abdomen: +BS x4, soft, non tender, non distended  Extremities: Right hip fracture post repair, normal bulk  Skin: Right hip incision with honeycomb dressing dry/intact  Neuro: Lethargic, following commands, PERRLA  GU: Deferred   Resolved Hospital Problem list    Assessment & Plan:  #Acute metabolic encephalopathy  - Correct metabolic derangements - Avoid sedating medication when able  - Frequent reorientation   #Shock: cardiogenic and hypovolemic  #HFpEF Hx: Paroxysmal atrial fibrillation, permanent cardiac pacemaker, CAD, sinus node dysfunction  - Continuous telemetry monitoring  - Trend troponin's until peaked  - Echo and BNP pending  - TSH and free T4 pending  - Aggressive iv fluid resuscitation, scheduled midodrine, prn levophed/vasopressin gtts to maintain map >65; if pt remains hypotensive will consult Cardiology for possible adjustment of pacemaker rate  - Resume outpatient amiodarone once able to tolerate po's; hold  outpatient eliquis for now - Will r/o sepsis   #Acute hypercapnic respiratory failure suspect secondary to sedating medication  - Bipap for now due to hypercapnia - Prn supplemental O2 for dyspnea and/or hypoxia  - Maintain O2 sats >92% - Follow CXR and ABG's  - Aggressive pulmonary hygiene  when able   #Acute kidney injury superimposed on CKD stage IV - Trend BMP  - Replace electrolytes as indicated  - Monitor UOP - Avoid nephrotoxic medications   #Anemia with intra-operative blood loss  - Trend CBC  - Will check DIC panel/PT/INR due to concern of possible fat embolism  - Monitor for s/sx of bleeding  - Transfuse for hgb <8 - Folic acid and vitamin B12 levels pending  - Hold chemical VTE px for now   #Transaminitis likely secondary to hypotension  #Chronically elevated alk phosphatase  - Trend hepatic function panel  - Avoid hepatoxic medications   - Will obtain RUQ limited US   #Leukocytosis no obvious signs of infection  - Trend WBC and monitor fever curve  - Trend PCT  - Will obtain blood cultures and UA  - Continue empiric cefazolin for now per ortho  #Right intertrochanteric hip fracture following a mechanical fall s/p intramedullary nail intertrochanteric  #Acute pain  - Orthopedic consulted appreciate input  - Prn fentanyl for pain managemen  #GERD  Hx Gastritis  - IV pepcid   #Type II Diabetes Mellitus  - CBG's q4hrs  - SSI   Best Practice (right click and "Reselect all SmartList Selections" daily)   Diet/type: NPO DVT prophylaxis: SCD GI prophylaxis: H2B Lines: Pt pending CVL placement  Foley:  N/A Code Status:  full code Last date of multidisciplinary goals of care discussion [N/A]  05/11: Updated pts son Amariee Henson regarding decline in pts condition and informed pt high risk for cardiac arrest.  Discussed code status and he stated per pt request she is a Full Code.  However, if she were to require mechanical intubation she would only want to be mechanically intubated for a short period of time.  All questions were answered.   Labs   CBC: Recent Labs  Lab 01/20/23 1022 01/21/23 0420  WBC 5.6 14.2*  NEUTROABS 3.5  --   HGB 13.0 11.6*  HCT 39.2 37.3  MCV 97.8 103.0*  PLT 160 152    Basic Metabolic Panel: Recent Labs  Lab  01/20/23 1022 01/21/23 0420  NA 136 139  K 4.7 5.0  CL 102 107  CO2 21* 25  GLUCOSE 173* 274*  BUN 42* 44*  CREATININE 1.83* 2.36*  CALCIUM 9.1 8.3*   GFR: Estimated Creatinine Clearance: 17.5 mL/min (A) (by C-G formula based on SCr of 2.36 mg/dL (H)). Recent Labs  Lab 01/20/23 1022 01/21/23 0420  WBC 5.6 14.2*    Liver Function Tests: Recent Labs  Lab 01/20/23 1022 01/21/23 0420  AST 72* 98*  ALT 37 61*  ALKPHOS 151* 156*  BILITOT 1.7* 1.5*  PROT 7.3 6.5  ALBUMIN 4.1 3.5   No results for input(s): "LIPASE", "AMYLASE" in the last 168 hours. No results for input(s): "AMMONIA" in the last 168 hours.  ABG    Component Value Date/Time   PHART 7.40 04/12/2018 0424   PCO2ART 40 04/12/2018 0424   PO2ART 123 (H) 04/12/2018 0424   HCO3 24.8 04/12/2018 0424   TCO2 24 07/15/2020 0921   ACIDBASEDEF 1.1 04/10/2018 0317   O2SAT 98.8 04/12/2018 0424     Coagulation Profile: Recent Labs  Lab 01/20/23 1045  INR 1.2    Cardiac Enzymes: No results for input(s): "CKTOTAL", "CKMB", "CKMBINDEX", "TROPONINI" in the last 168 hours.  HbA1C: Hemoglobin A1C  Date/Time Value Ref Range Status  09/26/2012 12:00 PM 7.5 (H) 4.2 - 6.3 % Final    Comment:    The American Diabetes Association recommends that a primary goal of therapy should be <7% and that physicians should reevaluate the treatment regimen in patients with HbA1c values consistently >8%.    Hgb A1c MFr Bld  Date/Time Value Ref Range Status  05/29/2022 10:29 PM 6.2 (H) 4.8 - 5.6 % Final    Comment:    (NOTE) Pre diabetes:          5.7%-6.4%  Diabetes:              >6.4%  Glycemic control for   <7.0% adults with diabetes   03/05/2022 05:23 AM 9.5 (H) 4.8 - 5.6 % Final    Comment:    (NOTE)         Prediabetes: 5.7 - 6.4         Diabetes: >6.4         Glycemic control for adults with diabetes: <7.0     CBG: Recent Labs  Lab 01/21/23 0330 01/21/23 1036  GLUCAP 265* 273*    Review of Systems:    Unable to assess pt confused and lethargic   Past Medical History:  She,  has a past medical history of A-fib (HCC) (2016), Acid reflux, Acute respiratory failure (HCC) (04/09/2018), Anemia, Anxiety, Atrial flutter (HCC) (08/24/2017), Bowel obstruction (HCC), Carotid artery disease (HCC), Chronic kidney disease (CKD) stage G3a/A1, moderately decreased glomerular filtration rate (GFR) between 45-59 mL/min/1.73 square meter and albuminuria creatinine ratio less than 30 mg/g (HCC), Closed fracture of one rib of right side (05/29/2020), Complete tear of left rotator cuff (10/16/2014), Coronary artery disease, Depression, Diabetes mellitus without complication (HCC), Diarrhea (03/04/2022), Dyspnea, Dysrhythmia, Fatty liver, Gastritis without bleeding, GI bleed, Gout, Hypertension, Hypothyroidism, Low back pain, Morbid obesity (HCC), Multiple gastric polyps, Neuromuscular disorder (HCC), Osteoarthritis, Osteoporosis, Pacemaker (04/09/2018), Paroxysmal atrial fibrillation (HCC), Presence of permanent cardiac pacemaker, Sinus node dysfunction (HCC) (08/24/2017), Stroke (HCC), Tendinitis of upper biceps tendon of left shoulder (11/10/2019), TIA (transient ischemic attack), 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   Surgical History:   Past Surgical History:  Procedure Laterality Date   ABDOMINAL HYSTERECTOMY  1980   APPENDECTOMY     BACK SURGERY     2008. plate & screws    BACK SURGERY  2019   patient describes kyphoplasty for compression fractures, MD referral said fusion   BREAST BIOPSY Right 08/16   stereo fibroadenomatous change, neg for atypia   BREAST EXCISIONAL BIOPSY Left    neg   CARDIAC CATHETERIZATION Left 08/25/2016   Procedure: Left Heart Cath and Coronary Angiography;  Surgeon: Lamar Blinks, MD;  Location: [facility] INVASIVE CV LAB;  Service:  Cardiovascular;  Laterality: Left;   CARDIOVERSION N/A 06/28/2017   Procedure: CARDIOVERSION;  Surgeon: Lamar Blinks, MD;  Location: [facility] ORS;  Service: Cardiovascular;  Laterality: N/A;   CARDIOVERSION N/A 02/06/2018   Procedure: CARDIOVERSION;  Surgeon: Wendall Stade, MD;  Location: [facility] ENDOSCOPY;  Service: Cardiovascular;  Laterality: N/A;   CARDIOVERSION N/A 07/15/2020   Procedure: CARDIOVERSION;  Surgeon: Jake Bathe, MD;  Location: [facility] ENDOSCOPY;  Service: Cardiovascular;  Laterality:  N/A;   CATARACT EXTRACTION, BILATERAL  2012   CHOLECYSTECTOMY     colon blockage  1999   COLON SURGERY  2000   removed 20% of colon. colon had collapsed   COLONOSCOPY  2016   polyps removed 2016   DORSAL COMPARTMENT RELEASE Left 09/14/2016   Procedure: RELEASE DORSAL COMPARTMENT (DEQUERVAIN);  Surgeon: Donato Heinz, MD;  Location: [facility] ORS;  Service: Orthopedics;  Laterality: Left;   ESOPHAGOGASTRODUODENOSCOPY N/A 01/27/2015   Procedure: ESOPHAGOGASTRODUODENOSCOPY (EGD);  Surgeon: Christena Deem, MD;  Location: [facility] ENDOSCOPY;  Service: Endoscopy;  Laterality: N/A;   ESOPHAGOGASTRODUODENOSCOPY (EGD) WITH PROPOFOL N/A 07/24/2015   Procedure: ESOPHAGOGASTRODUODENOSCOPY (EGD) WITH PROPOFOL;  Surgeon: Midge Minium, MD;  Location: [facility] ENDOSCOPY;  Service: Endoscopy;  Laterality: N/A;   ESOPHAGOGASTRODUODENOSCOPY (EGD) WITH PROPOFOL N/A 04/12/2018   Procedure: ESOPHAGOGASTRODUODENOSCOPY (EGD) WITH PROPOFOL;  Surgeon: Wyline Mood, MD;  Location: [facility] ENDOSCOPY;  Service: Gastroenterology;  Laterality: N/A;   ESOPHAGOGASTRODUODENOSCOPY (EGD) WITH PROPOFOL N/A 06/22/2018   Procedure: ESOPHAGOGASTRODUODENOSCOPY (EGD) WITH PROPOFOL;  Surgeon: Rachael Fee, MD;  Location: [facility] ENDOSCOPY;  Service: Endoscopy;  Laterality: N/A;   ESOPHAGOGASTRODUODENOSCOPY (EGD) WITH PROPOFOL N/A 07/09/2018   Procedure: ESOPHAGOGASTRODUODENOSCOPY (EGD) WITH PROPOFOL with resection of gastric polyps;  Surgeon: Wyline Mood, MD;   Location: [facility] ENDOSCOPY;  Service: Gastroenterology;  Laterality: N/A;   ESOPHAGOGASTRODUODENOSCOPY (EGD) WITH PROPOFOL N/A 05/17/2019   Procedure: ESOPHAGOGASTRODUODENOSCOPY (EGD) WITH PROPOFOL;  Surgeon: Wyline Mood, MD;  Location: [facility] ENDOSCOPY;  Service: Gastroenterology;  Laterality: N/A;   EUS N/A 06/22/2018   Procedure: UPPER ENDOSCOPIC ULTRASOUND (EUS) RADIAL;  Surgeon: Rachael Fee, MD;  Location: [facility] ENDOSCOPY;  Service: Endoscopy;  Laterality: N/A;   EYE SURGERY Bilateral    cataract extractions   HUMERUS IM NAIL Left 06/02/2022   Procedure: INTRAMEDULLARY (IM) NAIL HUMERAL;  Surgeon: Christena Flake, MD;  Location: [facility] ORS;  Service: Orthopedics;  Laterality: Left;   JOINT REPLACEMENT Bilateral 2014   Bilateral Knee replacement   LEAD REVISION/REPAIR N/A 08/29/2017   Procedure: LEAD REVISION/REPAIR;  Surgeon: Regan Lemming, MD;  Location: [facility] INVASIVE CV LAB;  Service: Cardiovascular;  Laterality: N/A;   OOPHORECTOMY     PACEMAKER INSERTION Left 07/01/2015   Procedure: INSERTION PACEMAKER;  Surgeon: Marcina Millard, MD;  Location: [facility] ORS;  Service: Cardiovascular;  Laterality: Left;   PACEMAKER REVISION N/A 08/28/2017   Procedure: PACEMAKER REVISION;  Surgeon: Duke Salvia, MD;  Location: [facility] INVASIVE CV LAB;  Service: Cardiovascular;  Laterality: N/A;   REPLACEMENT TOTAL KNEE BILATERAL     SHOULDER ARTHROSCOPY WITH ROTATOR CUFF REPAIR AND OPEN BICEPS TENODESIS Left 11/21/2019   Procedure: LEFT SHOULDER ARTHROSCOPY WITH DEBRIDEMENT, DECOMPRESSION, ROTATOR CUFF REPAIR AND BICEPS TENOLYSIS;  Surgeon: Christena Flake, MD;  Location: [facility] ORS;  Service: Orthopedics;  Laterality: Left;   SHOULDER ARTHROSCOPY WITH SUBACROMIAL DECOMPRESSION Left 2013   TEE WITHOUT CARDIOVERSION N/A 06/28/2017   Procedure: TRANSESOPHAGEAL ECHOCARDIOGRAM (TEE);  Surgeon: Lamar Blinks, MD;  Location: [facility] ORS;  Service: Cardiovascular;  Laterality: N/A;     Social History:   reports that  she has never smoked. She has never used smokeless tobacco. She reports that she does not drink alcohol and does not use drugs.   Family History:  Her family history includes CAD in her mother; Cancer in her father; Diabetes Mellitus II in her brother and mother; Heart attack in her mother; Stroke in her brother. There is no history of Breast cancer.   Allergies Allergies  Allergen Reactions  Cephalosporins Other (See Comments), Diarrhea and Nausea Only    Reaction:  Unknown  Other reaction(s): Other (see comments) Other reaction(s): Other (See Comments) Other Reaction: Intolerance  Reaction:  Stomach pain  Other Reaction: Intolerance Reaction:  Unknown    Fish Allergy Shortness Of Breath    All kinds of fish. Severe vomitting even with smell of fish.    Macrolides And Ketolides Other (See Comments)    Severe stomach pain (mycins)   Meperidine Shortness Of Breath, Nausea Only and Other (See Comments)    Stomach pain    Other Nausea Only, Swelling, Rash, Anaphylaxis, Diarrhea, Shortness Of Breath and Other (See Comments)    Allergen: NON-STEROIDS, bextra - hands and feet swell  Reaction:  Stomach pain   Other Reaction: Intolerance   Prednisone Anaphylaxis and Other (See Comments)    (facial swelling/redness/burning)Increases pts blood sugar  Pt states that she is allergic to all steroids.     Shellfish Allergy Anaphylaxis, Shortness Of Breath, Diarrhea, Nausea Only and Rash    Stomach pain    Sulfa Antibiotics Shortness Of Breath, Diarrhea, Nausea Only and Other (See Comments)    Reaction:  Stomach pain   Reaction:  Stomach pain  Other Reaction: Intolerance   Sulfacetamide Sodium Diarrhea, Nausea Only, Other (See Comments) and Shortness Of Breath    Reaction:  Stomach pain    Telbivudine Other (See Comments)    Unknown reaction   Uloric [Febuxostat] Anaphylaxis    Locks pt's body up    Aspirin Rash and Other (See Comments)    Stomach pain (tolerates lower doses)    Celecoxib Other (See Comments)    GI bleed, weakness, and stomach pain.     Cephalexin Diarrhea, Nausea Only and Other (See Comments)    stomach pain    Codeine Diarrhea, Nausea Only and Other (See Comments)    Reaction:  Stomach pain Pt tolerates morphine    Dilaudid [Hydromorphone Hcl] Other (See Comments)    Reactions: easy to overdose - blood pressure drops really low   Erythromycin Diarrhea, Nausea Only and Other (See Comments)    Reaction:  Stomach pain    Iodinated Contrast Media Rash and Other (See Comments)    Pt states that she is unable to have because she has chronic kidney disease.   CKD Pt states that she is unable to have because she has chronic kidney disease.   CKD    Lipitor [Atorvastatin Calcium] Nausea Only    Reaction: nausea, weakness, pass blood   Oxycodone Other (See Comments)    Reaction:  Stomach pain    Aleve [Naproxen]     Reaction: severe stomach pain   Atorvastatin Nausea Only and Other (See Comments)    Weakness    Doxycycline     Stomach pain    Iodine Rash   Motrin [Ibuprofen]     Reaction: severe stomach pain   Tape Other (See Comments)    Causes pts skin to tear  Pt states that she is able to use paper tape.       Valdecoxib Swelling and Other (See Comments)    Pt states that her hands and feet swell.       Home Medications  Prior to Admission medications   Medication Sig Start Date End Date Taking? Authorizing Provider  acetaminophen (TYLENOL) 325 MG tablet Take 2 tablets (650 mg total) by mouth every 6 (six) hours as needed for mild pain, moderate pain or fever (or Fever >/= 101). Patient  taking differently: Take 325 mg by mouth every 6 (six) hours as needed for mild pain, moderate pain or fever (or Fever >/= 101). 06/08/22  Yes Lurene Shadow, MD  acetaminophen (TYLENOL) 500 MG tablet Take 500 mg by mouth in the morning, at noon, and at bedtime.   Yes [provider]  albuterol (VENTOLIN HFA) 108 (90 Base) MCG/ACT inhaler  Inhale 2 puffs into the lungs every 6 (six) hours as needed for wheezing or shortness of breath. Ventolin   Yes [provider]  allopurinol (ZYLOPRIM) 100 MG tablet Take 100 mg by mouth daily.   Yes [provider]  amiodarone (PACERONE) 200 MG tablet TAKE 1 TABLET BY MOUTH ONCE DAILY . APPOINTMENT REQUIRED FOR FUTURE REFILLS 02/14/22  Yes Duke Salvia, MD  apixaban (ELIQUIS) 2.5 MG TABS tablet Take 1 tablet (2.5 mg total) by mouth 2 (two) times daily. 03/06/22  Yes Sunnie Nielsen, DO  ciprofloxacin (CIPRO) 500 MG tablet Take 500 mg by mouth daily. 05/07 - 05/14   Yes [provider]  docusate sodium (COLACE) 100 MG capsule Take 100 mg by mouth 2 (two) times daily.   Yes [provider]  famotidine (PEPCID) 10 MG tablet Take 10 mg by mouth daily.   Yes [provider]  Fluticasone Furoate (FLONASE SENSIMIST NA) Place 1 spray into the nose daily.   Yes [provider]  furosemide (LASIX) 40 MG tablet Take 40 mg by mouth daily.   Yes [provider]  gabapentin (NEURONTIN) 100 MG capsule Take 100 mg by mouth 2 (two) times daily. 05/07 - 05/11   Yes [provider]  HUMALOG 100 UNIT/ML injection Inject 0-12 Units into the skin 3 (three) times daily with meals. Sliding scale 05/08/22  Yes [provider]  LANTUS 100 UNIT/ML injection 16 Units at bedtime. 05/10/22  Yes [provider]  Levothyroxine Sodium 37.5 MCG CAPS Take 37.5 mcg by mouth daily before breakfast. 01/05/23  Yes Duke Salvia, MD  midodrine (PROAMATINE) 5 MG tablet Take 5 mg by mouth 3 (three) times daily with meals. 01/18/23 07/17/23 Yes [provider]  oxybutynin (DITROPAN) 5 MG tablet Take 5 mg by mouth 3 (three) times daily. Breakfast & bedtime   Yes [provider]  Polyethyl Glycol-Propyl Glycol 0.4-0.3 % SOLN Place 1 drop into both eyes 2 (two) times daily as needed (dry eyes).   Yes [provider]  pravastatin  (PRAVACHOL) 20 MG tablet Take 20 mg by mouth at bedtime.  07/25/18  Yes [provider]  Prenat-FeCbn-FeBisg-FA-Omega (MULTIVITAMIN/MINERALS PO) Take 1 tablet by mouth daily.   Yes [provider]  rOPINIRole (REQUIP) 0.25 MG tablet Take 1 tablet (0.25 mg total) by mouth at bedtime. 06/08/22  Yes Lurene Shadow, MD  sertraline (ZOLOFT) 25 MG tablet Take 25 mg by mouth daily.   Yes [provider]  sertraline (ZOLOFT) 50 MG tablet Take 50 mg by mouth daily.   Yes [provider]  talc powder Apply 1 application topically as needed (under the breast irritation).   Yes [provider]  traZODone (DESYREL) 50 MG tablet Take 50 mg by mouth at bedtime as needed for sleep. 06/07/18  Yes [provider]  traZODone (DESYREL) 50 MG tablet Take 25 mg by mouth 2 (two) times daily as needed for sleep. Do not give within 2 hours of night dose   Yes [provider]  traZODone (DESYREL) 50 MG tablet Take 50 mg by mouth at bedtime.  Yes [provider]  vitamin B-12 (CYANOCOBALAMIN) 1000 MCG tablet Take 1,000 mcg by mouth daily.   Yes [provider]  alum & mag hydroxide-simeth (MAALOX/MYLANTA) 200-200-20 MG/5ML suspension Take 10 mLs by mouth 3 (three) times daily as needed for indigestion or heartburn.    [provider]  senna (SENOKOT) 8.6 MG TABS tablet Take 1 tablet by mouth every Monday, Wednesday, and Friday.    [provider]  Simethicone LIQD 10 mLs by Does not apply route 3 (three) times daily as needed. 200-200-20 mg/5 mL Patient not taking: Reported on 01/20/2023    [provider]     Critical care time: 75 minutes      Zada Girt, AGNP  Pulmonary/Critical Care Pager [phone] (please enter 7 digits) PCCM Consult Pager [phone] (please enter 7 digits)

## 2023-01-21 NOTE — Assessment & Plan Note (Addendum)
Postoperative.  Resolved.  Patient on chronic midodrine.

## 2023-01-21 NOTE — Progress Notes (Signed)
Updated pts son [name] regarding pts condition and current plan of care.  Zada Girt, AGNP  Pulmonary/Critical Care Pager [phone] (please enter 7 digits) PCCM Consult Pager [phone] (please enter 7 digits)

## 2023-01-21 NOTE — OR Nursing (Signed)
Per Annabelle Harman, ICU NP, VORB to increase levo up to 7mcg/min as needed until central line access is started and can initiate second pressor.

## 2023-01-21 NOTE — Assessment & Plan Note (Addendum)
On famotidine

## 2023-01-21 NOTE — Assessment & Plan Note (Addendum)
No signs of heart failure this morning. On low dose lasix

## 2023-01-21 NOTE — Assessment & Plan Note (Signed)
On synthroid 

## 2023-01-21 NOTE — Anesthesia Procedure Notes (Signed)
Procedure Name: Intubation Date/Time: 01/21/2023 8:57 AM  Performed by: Elmarie Mainland, CRNAPre-anesthesia Checklist: Patient identified, Emergency Drugs available, Suction available and Patient being monitored Patient Re-evaluated:Patient Re-evaluated prior to induction Oxygen Delivery Method: Circle system utilized Preoxygenation: Pre-oxygenation with 100% oxygen Induction Type: IV induction Ventilation: Mask ventilation without difficulty and Oral airway inserted - appropriate to patient size Laryngoscope Size: McGraph and 3 Grade View: Grade I Tube type: Oral Tube size: 6.5 mm Number of attempts: 1 Airway Equipment and Method: Stylet, Oral airway and Video-laryngoscopy Placement Confirmation: ETT inserted through vocal cords under direct vision, positive ETCO2 and breath sounds checked- equal and bilateral Secured at: 22 cm Tube secured with: Tape Dental Injury: Teeth and Oropharynx as per pre-operative assessment

## 2023-01-21 NOTE — Transfer of Care (Signed)
Immediate Anesthesia Transfer of Care Note  Patient: DENELLE SIFFORD  Procedure(s) Performed: INTRAMEDULLARY (IM) NAIL INTERTROCHANTERIC (Right: Hip)  Patient Location: PACU  Anesthesia Type:General  Level of Consciousness: awake, drowsy, and patient cooperative  Airway & Oxygen Therapy: Patient Spontanous Breathing and Patient connected to face mask oxygen  Post-op Assessment: Report given to RN and Post -op Vital signs reviewed and stable  Post vital signs: Reviewed and stable  Last Vitals:  Vitals Value Taken Time  BP    Temp    Pulse    Resp    SpO2      Last Pain:  Vitals:   01/20/23 2229  TempSrc:   PainSc: Asleep         Complications: No notable events documented.

## 2023-01-21 NOTE — Anesthesia Postprocedure Evaluation (Addendum)
Anesthesia Post Note  Patient: Deborah Obrien  Procedure(s) Performed: INTRAMEDULLARY (IM) NAIL INTERTROCHANTERIC (Right: Hip)  Patient location during evaluation: PACU Anesthesia Type: General Level of consciousness: awake and alert and sedated Pain management: pain level not controlled Vital Signs Assessment: post-procedure vital signs reviewed and stable Respiratory status: spontaneous breathing, nonlabored ventilation, respiratory function stable and patient connected to nasal cannula oxygen Cardiovascular status: unstable Postop Assessment: no apparent nausea or vomiting Anesthetic complications: no Comments: Pt with continued hypotension in pacu. She did not respond to 500cc NS bolus and norepinephrine drip had to be escalated. She has received 800cc IVF during the operation and pacu. Her pain was causing distress and her blood pressure lowered with fentanyl and tramadol administration. Pulmonary hypertension and acute on chronic heart failure can be a cause that is worsened by pain, now possible hypercapnia from somnolence. There is a low suspicion for hypovolemia or other forms of hypotension such as pulmonary embolism. The ICU was consulted for further titration of fluids and norepinephrine, observation, and possible further work-up for hypotension. Lab work and fluid bolus has been ordered.   Update: Narcan administered for pt somnolence with hypercarbia noted on ABG. ICU ordered BIPAP. CVL was placed by myself with consent from son. Pt's mentation improved but remains on NE gtt. ICU managing but patient in pacu. Beside echo with ICU physician indicated heart failure and BNP notably elevated.    No notable events documented.   Last Vitals:  Vitals:   01/21/23 1045 01/21/23 1100  BP: (!) 92/33 (!) 89/37  Pulse: (!) 59 (!) 59  Resp: (!) 25 16  Temp: 37.6 C   SpO2: 100% 96%    Last Pain:  Vitals:   01/21/23 1020  TempSrc:   PainSc: Asleep                 Foye Deer

## 2023-01-21 NOTE — Assessment & Plan Note (Addendum)
Lasr cr 1.39. Peaked at 2.68 on 5/12.

## 2023-01-21 NOTE — Assessment & Plan Note (Addendum)
Intramedullary nailing done by Dr Allena Katz on 5/11.  Follow up with orthpedics in around 4 days.  As needed pain meds. Out to rehab today

## 2023-01-21 NOTE — Anesthesia Preprocedure Evaluation (Addendum)
Anesthesia Evaluation  Patient identified by MRN, date of birth, ID band Patient awake    Reviewed: Allergy & Precautions, NPO status , Patient's Chart, lab work & pertinent test results  History of Anesthesia Complications Negative for: history of anesthetic complications  Airway Mallampati: II  TM Distance: >3 FB Neck ROM: Full    Dental  (+) Poor Dentition, Edentulous Upper, Missing   Pulmonary shortness of breath, with exertion and at rest History of pulmonary fibrosis, used Oxygen at home in the past but stopped   breath sounds clear to auscultation       Cardiovascular hypertension, + CAD, + Peripheral Vascular Disease (Carotid artery disease) and +CHF (HFpEF)  + dysrhythmias Atrial Fibrillation + pacemaker (MDT dual chamber PPM)  Rhythm:Regular Rate:Normal - Systolic murmurs, - Diastolic murmurs and - Peripheral Edema Midodrine use  Echo 2019 - Left ventricle: The cavity size was normal. Wall thickness was    normal. Systolic function was normal. The estimated ejection    fraction was in the range of 60% to 65%.  - Mitral valve: Moderately thickened, mildly calcified leaflets .  - Left atrium: The atrium was mildly dilated.  - Right ventricle: The cavity size was mildly to moderately    dilated. Wall thickness was mildly increased.  - Right atrium: The atrium was mildly dilated.  - Tricuspid valve: There was moderate regurgitation.  - Pulmonary arteries: Systolic pressure was moderately increased,    estimated to be 50 mm Hg plus central venous/right atrial    pressure.     Neuro/Psych  PSYCHIATRIC DISORDERS Anxiety Depression    ambulates with a walker at baseline TIA Neuromuscular disease    GI/Hepatic Neg liver ROS,GERD  ,,  Endo/Other  diabetesHypothyroidism    Renal/GU Renal InsufficiencyRenal disease  negative genitourinary   Musculoskeletal  (+) Arthritis ,    Abdominal Normal abdominal exam  (+)    Peds negative pediatric ROS (+)  Hematology  (+) Blood dyscrasia, anemia   Anesthesia Other Findings Pt hypotensive overnight. She arrives in pre-op with pain, nausea, and slight SOB. Her vitals reviewed with only findings of hypotension. She just received her midodrine am dose. BS CTAB. Bedside echo performed with distended RV and IVC. EF of LV appears normal. Pt denies CP. Labs reviewed. Will proceed with case with high awareness of possible acute on chronic heart failure or pulmonary embolus.   Reproductive/Obstetrics negative OB ROS                              Anesthesia Physical Anesthesia Plan  ASA: 4  Anesthesia Plan: General   Post-op Pain Management: Regional block*, Ketamine IV* and Ofirmev IV (intra-op)*   Induction: Intravenous  PONV Risk Score and Plan: 3 and Ondansetron, Dexamethasone and Treatment may vary due to age or medical condition  Airway Management Planned: Oral ETT  Additional Equipment: Arterial line  Intra-op Plan:   Post-operative Plan: Extubation in OR  Informed Consent: I have reviewed the patients History and Physical, chart, labs and discussed the procedure including the risks, benefits and alternatives for the proposed anesthesia with the patient or authorized representative who has indicated his/her understanding and acceptance.   Patient has DNR.  Discussed DNR with patient and Suspend DNR.   Dental Advisory Given  Plan Discussed with: Anesthesiologist, CRNA and Surgeon  Anesthesia Plan Comments: (Possible ICU admission discussed  Patient consented for risks of anesthesia including but not limited to:  -  adverse reactions to medications - damage to eyes, teeth, lips or other oral mucosa - nerve damage due to positioning  - sore throat or hoarseness - Damage to heart, brain, nerves, lungs, other parts of body or loss of life  Patient voiced understanding.)         Anesthesia Quick Evaluation

## 2023-01-21 NOTE — Assessment & Plan Note (Signed)
Likely from low blood pressure

## 2023-01-21 NOTE — Progress Notes (Signed)
Pt BP 106/33 with MAP of 55 and also drowsy. Manuela Schwartz notified. Will continue to monitor the pt.

## 2023-01-22 ENCOUNTER — Inpatient Hospital Stay (HOSPITAL_COMMUNITY)
Admit: 2023-01-22 | Discharge: 2023-01-22 | Disposition: A | Discharge status: Home / Self Care | Payer: Medicare Other | Attending: Critical Care Medicine | Admitting: Critical Care Medicine

## 2023-01-22 ENCOUNTER — Encounter: Payer: Self-pay | Admitting: Family Medicine

## 2023-01-22 DIAGNOSIS — I272 Pulmonary hypertension, unspecified: Secondary | ICD-10-CM | POA: Diagnosis not present

## 2023-01-22 DIAGNOSIS — R579 Shock, unspecified: Secondary | ICD-10-CM | POA: Diagnosis not present

## 2023-01-22 DIAGNOSIS — I5032 Chronic diastolic (congestive) heart failure: Secondary | ICD-10-CM | POA: Diagnosis not present

## 2023-01-22 DIAGNOSIS — Z7189 Other specified counseling: Secondary | ICD-10-CM

## 2023-01-22 DIAGNOSIS — I480 Paroxysmal atrial fibrillation: Secondary | ICD-10-CM | POA: Diagnosis not present

## 2023-01-22 DIAGNOSIS — S72001A Fracture of unspecified part of neck of right femur, initial encounter for closed fracture: Secondary | ICD-10-CM | POA: Diagnosis not present

## 2023-01-22 DIAGNOSIS — I9581 Postprocedural hypotension: Secondary | ICD-10-CM

## 2023-01-22 DIAGNOSIS — E872 Acidosis, unspecified: Secondary | ICD-10-CM

## 2023-01-22 DIAGNOSIS — N179 Acute kidney failure, unspecified: Secondary | ICD-10-CM | POA: Diagnosis not present

## 2023-01-22 DIAGNOSIS — D6489 Other specified anemias: Secondary | ICD-10-CM

## 2023-01-22 DIAGNOSIS — W19XXXA Unspecified fall, initial encounter: Secondary | ICD-10-CM

## 2023-01-22 LAB — BLOOD GAS, ARTERIAL
Acid-base deficit: 6.5 mmol/L — ABNORMAL HIGH (ref 0.0–2.0)
Acid-base deficit: 5.9 mmol/L — ABNORMAL HIGH (ref 0.0–2.0)
Acid-base deficit: 5.7 mmol/L — ABNORMAL HIGH (ref 0.0–2.0)
Bicarbonate: 21.1 mmol/L (ref 20.0–28.0)
Delivery systems: POSITIVE
Expiratory PAP: 5 cmH2O
Inspiratory PAP: 12 cmH2O
O2 Content: 2 L/min
O2 Saturation: 98 %
Patient temperature: 37
Patient temperature: 37
pCO2 arterial: 46 mmHg (ref 32–48)
pH, Arterial: 7.27 — ABNORMAL LOW (ref 7.35–7.45)
pO2, Arterial: 99 mmHg (ref 83–108)
pO2, Arterial: 97 mmHg (ref 83–108)

## 2023-01-22 LAB — CBC WITH DIFFERENTIAL/PLATELET
Abs Immature Granulocytes: 0.08 10*3/uL — ABNORMAL HIGH (ref 0.00–0.07)
Basophils Absolute: 0 10*3/uL (ref 0.0–0.1)
Basophils Relative: 0 %
Eosinophils Absolute: 0 10*3/uL (ref 0.0–0.5)
Eosinophils Relative: 0 %
HCT: 29.8 % — ABNORMAL LOW (ref 36.0–46.0)
Hemoglobin: 9.3 g/dL — ABNORMAL LOW (ref 12.0–15.0)
Immature Granulocytes: 1 %
Lymphocytes Relative: 7 %
Lymphs Abs: 1.1 10*3/uL (ref 0.7–4.0)
MCH: 32.4 pg (ref 26.0–34.0)
MCHC: 31.2 g/dL (ref 30.0–36.0)
MCV: 103.8 fL — ABNORMAL HIGH (ref 80.0–100.0)
Monocytes Absolute: 2 10*3/uL — ABNORMAL HIGH (ref 0.1–1.0)
Monocytes Relative: 12 %
Neutro Abs: 13 10*3/uL — ABNORMAL HIGH (ref 1.7–7.7)
Neutrophils Relative %: 80 %
Platelets: 143 10*3/uL — ABNORMAL LOW (ref 150–400)
RBC: 2.87 MIL/uL — ABNORMAL LOW (ref 3.87–5.11)
RDW: 13.8 % (ref 11.5–15.5)
WBC: 16.1 10*3/uL — ABNORMAL HIGH (ref 4.0–10.5)
nRBC: 0.1 % (ref 0.0–0.2)

## 2023-01-22 LAB — HEMOGLOBIN AND HEMATOCRIT, BLOOD
HCT: 28.1 % — ABNORMAL LOW (ref 36.0–46.0)
HCT: 26.8 % — ABNORMAL LOW (ref 36.0–46.0)
HCT: 23.9 % — ABNORMAL LOW (ref 36.0–46.0)
Hemoglobin: 8.9 g/dL — ABNORMAL LOW (ref 12.0–15.0)
Hemoglobin: 8.4 g/dL — ABNORMAL LOW (ref 12.0–15.0)
Hemoglobin: 7.8 g/dL — ABNORMAL LOW (ref 12.0–15.0)

## 2023-01-22 LAB — URINALYSIS, COMPLETE (UACMP) WITH MICROSCOPIC
Bacteria, UA: NONE SEEN
Glucose, UA: NEGATIVE mg/dL
Leukocytes,Ua: NEGATIVE
Nitrite: NEGATIVE
Protein, ur: 30 mg/dL — AB
Specific Gravity, Urine: >1.03 — ABNORMAL HIGH (ref 1.005–1.030)
pH: 5.5 (ref 5.0–8.0)

## 2023-01-22 LAB — COMPREHENSIVE METABOLIC PANEL
ALT: 40 U/L (ref 0–44)
AST: 72 U/L — ABNORMAL HIGH (ref 15–41)
Albumin: 3.3 g/dL — ABNORMAL LOW (ref 3.5–5.0)
Alkaline Phosphatase: 119 U/L (ref 38–126)
Anion gap: 8 (ref 5–15)
BUN: 51 mg/dL — ABNORMAL HIGH (ref 8–23)
CO2: 23 mmol/L (ref 22–32)
Calcium: 8 mg/dL — ABNORMAL LOW (ref 8.9–10.3)
Chloride: 107 mmol/L (ref 98–111)
Creatinine, Ser: 2.68 mg/dL — ABNORMAL HIGH (ref 0.44–1.00)
GFR, Estimated: 17 mL/min — ABNORMAL LOW (ref >60)
Glucose, Bld: 239 mg/dL — ABNORMAL HIGH (ref 70–99)
Potassium: 4.3 mmol/L (ref 3.5–5.1)
Sodium: 138 mmol/L (ref 135–145)
Total Bilirubin: 0.7 mg/dL (ref 0.3–1.2)
Total Protein: 6.1 g/dL — ABNORMAL LOW (ref 6.5–8.1)

## 2023-01-22 LAB — GLUCOSE, CAPILLARY
Glucose-Capillary: 170 mg/dL — ABNORMAL HIGH (ref 70–99)
Glucose-Capillary: 176 mg/dL — ABNORMAL HIGH (ref 70–99)
Glucose-Capillary: 183 mg/dL — ABNORMAL HIGH (ref 70–99)
Glucose-Capillary: 205 mg/dL — ABNORMAL HIGH (ref 70–99)
Glucose-Capillary: 227 mg/dL — ABNORMAL HIGH (ref 70–99)
Glucose-Capillary: 233 mg/dL — ABNORMAL HIGH (ref 70–99)

## 2023-01-22 LAB — ECHOCARDIOGRAM COMPLETE
AR max vel: 2.28 cm2
AV Area VTI: 2.89 cm2
AV Area mean vel: 2.17 cm2
AV Mean grad: 3 mmHg
AV Peak grad: 5.7 mmHg
Ao pk vel: 1.19 m/s
Area-P 1/2: 3.1 cm2
Height: 63 in
MV VTI: 1.45 cm2
S' Lateral: 3.1 cm
Weight: 2472.68 oz

## 2023-01-22 LAB — PROCALCITONIN: Procalcitonin: 2.4 ng/mL

## 2023-01-22 LAB — APTT: aPTT: 85 seconds — ABNORMAL HIGH (ref 24–36)

## 2023-01-22 LAB — TROPONIN I (HIGH SENSITIVITY)
Troponin I (High Sensitivity): 101 ng/L (ref <18)
Troponin I (High Sensitivity): 90 ng/L — ABNORMAL HIGH (ref <18)
Troponin I (High Sensitivity): 88 ng/L — ABNORMAL HIGH (ref <18)

## 2023-01-22 LAB — CORTISOL-AM, BLOOD: Cortisol - AM: 6.5 ug/dL — ABNORMAL LOW (ref 6.7–22.6)

## 2023-01-22 LAB — PHOSPHORUS: Phosphorus: 6.3 mg/dL — ABNORMAL HIGH (ref 2.5–4.6)

## 2023-01-22 LAB — LACTIC ACID, PLASMA: Lactic Acid, Venous: 1.3 mmol/L (ref 0.5–1.9)

## 2023-01-22 LAB — MAGNESIUM: Magnesium: 2.2 mg/dL (ref 1.7–2.4)

## 2023-01-22 LAB — SARS CORONAVIRUS 2 BY RT PCR: SARS Coronavirus 2 by RT PCR: POSITIVE — AB

## 2023-01-22 LAB — SALICYLATE LEVEL: Salicylate Lvl: <7 mg/dL — ABNORMAL LOW (ref 7.0–30.0)

## 2023-01-22 LAB — BETA-HYDROXYBUTYRIC ACID: Beta-Hydroxybutyric Acid: 0.73 mmol/L — ABNORMAL HIGH (ref 0.05–0.27)

## 2023-01-22 MED ORDER — TRAMADOL HCL 50 MG PO TABS
50.0000 mg | ORAL_TABLET | Freq: Once | ORAL | Status: AC
  Administered 2023-01-22: 50 mg via ORAL
  Filled 2023-01-22: qty 1

## 2023-01-22 MED ORDER — NIRMATRELVIR/RITONAVIR (PAXLOVID) TABLET (RENAL DOSING): 2.0000 | ORAL_TABLET | Freq: Two times a day (BID) | ORAL | Status: DC

## 2023-01-22 MED ORDER — FOLIC ACID 5 MG/ML IJ SOLN
1.0000 mg | Freq: Every day | INTRAMUSCULAR | Status: AC
  Administered 2023-01-22 – 2023-01-23 (×2): 1 mg via INTRAVENOUS
  Filled 2023-01-22 (×2): qty 0.2

## 2023-01-22 MED ORDER — DIAZEPAM 5 MG/ML IJ SOLN: 20.0000 mg | Freq: Once | INTRAMUSCULAR | Status: DC

## 2023-01-22 MED ORDER — PIPERACILLIN-TAZOBACTAM IN DEX 2-0.25 GM/50ML IV SOLN
2.2500 g | Freq: Three times a day (TID) | INTRAVENOUS | Status: DC
  Administered 2023-01-22 – 2023-01-23 (×4): 2.25 g via INTRAVENOUS
  Filled 2023-01-22 (×5): qty 50

## 2023-01-22 MED ORDER — SODIUM CHLORIDE 0.9 % IV BOLUS
500.0000 mL | Freq: Once | INTRAVENOUS | Status: AC
  Administered 2023-01-22: 500 mL via INTRAVENOUS

## 2023-01-22 MED ORDER — THIAMINE MONONITRATE 100 MG PO TABS
100.0000 mg | ORAL_TABLET | Freq: Every day | ORAL | Status: DC
  Administered 2023-01-23 – 2023-02-01 (×10): 100 mg via ORAL
  Filled 2023-01-22 (×11): qty 1

## 2023-01-22 MED ORDER — HYDROCORTISONE SOD SUC (PF) 100 MG IJ SOLR
100.0000 mg | Freq: Two times a day (BID) | INTRAMUSCULAR | Status: AC
  Administered 2023-01-22 – 2023-01-24 (×6): 100 mg via INTRAVENOUS
  Filled 2023-01-22 (×6): qty 2

## 2023-01-22 MED ORDER — SODIUM CHLORIDE 0.9 % IV SOLN
100.0000 mg | Freq: Every day | INTRAVENOUS | Status: DC
  Filled 2023-01-22: qty 20

## 2023-01-22 MED ORDER — FENTANYL CITRATE PF 50 MCG/ML IJ SOSY
12.5000 ug | PREFILLED_SYRINGE | Freq: Once | INTRAMUSCULAR | Status: AC
  Administered 2023-01-22: 12.5 ug via INTRAVENOUS
  Filled 2023-01-22: qty 1

## 2023-01-22 MED ORDER — SODIUM CHLORIDE 0.9 % IV SOLN: INTRAVENOUS | Status: DC | PRN

## 2023-01-22 MED ORDER — HEPARIN (PORCINE) 25000 UT/250ML-% IV SOLN
1100.0000 [IU]/h | INTRAVENOUS | Status: DC
  Administered 2023-01-22: 1200 [IU]/h via INTRAVENOUS
  Filled 2023-01-22: qty 250

## 2023-01-22 MED ORDER — INSULIN ASPART 100 UNIT/ML IJ SOLN
0.0000 [IU] | INTRAMUSCULAR | Status: DC
  Administered 2023-01-22: 3 [IU] via SUBCUTANEOUS
  Administered 2023-01-22: 5 [IU] via SUBCUTANEOUS
  Administered 2023-01-22 (×2): 3 [IU] via SUBCUTANEOUS
  Administered 2023-01-23 (×2): 5 [IU] via SUBCUTANEOUS
  Administered 2023-01-23 (×2): 3 [IU] via SUBCUTANEOUS
  Administered 2023-01-23: 5 [IU] via SUBCUTANEOUS
  Administered 2023-01-23: 3 [IU] via SUBCUTANEOUS
  Filled 2023-01-22 (×10): qty 1

## 2023-01-22 MED ORDER — FENTANYL CITRATE PF 50 MCG/ML IJ SOSY
PREFILLED_SYRINGE | INTRAMUSCULAR | Status: AC
  Filled 2023-01-22: qty 1

## 2023-01-22 MED ORDER — ACETAMINOPHEN 10 MG/ML IV SOLN
1000.0000 mg | Freq: Four times a day (QID) | INTRAVENOUS | Status: AC
  Administered 2023-01-22 – 2023-01-23 (×4): 1000 mg via INTRAVENOUS
  Filled 2023-01-22 (×4): qty 100

## 2023-01-22 MED ORDER — FENTANYL CITRATE PF 50 MCG/ML IJ SOSY
12.5000 ug | PREFILLED_SYRINGE | INTRAMUSCULAR | Status: DC | PRN
  Administered 2023-01-22: 12.5 ug via INTRAVENOUS

## 2023-01-22 MED ORDER — HALOPERIDOL LACTATE 5 MG/ML IJ SOLN
10.0000 mg | Freq: Once | INTRAMUSCULAR | Status: DC
  Filled 2023-01-22: qty 2

## 2023-01-22 MED ORDER — HYDROCORTISONE SOD SUC (PF) 100 MG IJ SOLR: 50.0000 mg | Freq: Four times a day (QID) | INTRAMUSCULAR | Status: DC

## 2023-01-22 MED ORDER — SODIUM CHLORIDE 0.9 % IV SOLN
200.0000 mg | Freq: Once | INTRAVENOUS | Status: AC
  Administered 2023-01-22: 200 mg via INTRAVENOUS
  Filled 2023-01-22: qty 40

## 2023-01-22 NOTE — Progress Notes (Signed)
PT Cancellation Note  Patient Details Name: Deborah Obrien MRN: [id number] DOB: 10/03/1940   Cancelled Treatment:    Reason Eval/Treat Not Completed: Patient not medically ready. PT orders received and pt chart reviewed. Per conversation with nurse and NP, PT to hold PT evaluation today as pt is on multiple pressors and continues to have decreased BP levels. Will follow up with PT evaluation tomorrow, as appropriate.    Vira Blanco, PT, DPT 12:06 PM,01/22/23 Physical Therapist - [facility]

## 2023-01-22 NOTE — Progress Notes (Signed)
ANTICOAGULATION CONSULT NOTE  Pharmacy Consult for heparin infusion Indication: pulmonary embolus  Allergies  Allergen Reactions   Cephalosporins Other (See Comments), Diarrhea and Nausea Only    Reaction:  Unknown  Other reaction(s): Other (see comments) Other reaction(s): Other (See Comments) Other Reaction: Intolerance  Reaction:  Stomach pain  Other Reaction: Intolerance Reaction:  Unknown    Fish Allergy Shortness Of Breath    All kinds of fish. Severe vomitting even with smell of fish.    Macrolides And Ketolides Other (See Comments)    Severe stomach pain (mycins)   Meperidine Shortness Of Breath, Nausea Only and Other (See Comments)    Stomach pain    Other Nausea Only, Swelling, Rash, Anaphylaxis, Diarrhea, Shortness Of Breath and Other (See Comments)    Allergen: NON-STEROIDS, bextra - hands and feet swell  Reaction:  Stomach pain   Other Reaction: Intolerance   Prednisone Anaphylaxis and Other (See Comments)    (facial swelling/redness/burning)Increases pts blood sugar  Pt states that she is allergic to all steroids.     Shellfish Allergy Anaphylaxis, Shortness Of Breath, Diarrhea, Nausea Only and Rash    Stomach pain    Sulfa Antibiotics Shortness Of Breath, Diarrhea, Nausea Only and Other (See Comments)    Reaction:  Stomach pain   Reaction:  Stomach pain  Other Reaction: Intolerance   Sulfacetamide Sodium Diarrhea, Nausea Only, Other (See Comments) and Shortness Of Breath    Reaction:  Stomach pain    Telbivudine Other (See Comments)    Unknown reaction   Uloric [Febuxostat] Anaphylaxis    Locks pt's body up    Aspirin Rash and Other (See Comments)    Stomach pain (tolerates lower doses)   Celecoxib Other (See Comments)    GI bleed, weakness, and stomach pain.     Cephalexin Diarrhea, Nausea Only and Other (See Comments)    stomach pain    Codeine Diarrhea, Nausea Only and Other (See Comments)    Reaction:  Stomach pain Pt tolerates morphine     Dilaudid [Hydromorphone Hcl] Other (See Comments)    Reactions: easy to overdose - blood pressure drops really low   Erythromycin Diarrhea, Nausea Only and Other (See Comments)    Reaction:  Stomach pain    Iodinated Contrast Media Rash and Other (See Comments)    Pt states that she is unable to have because she has chronic kidney disease.   CKD Pt states that she is unable to have because she has chronic kidney disease.   CKD    Lipitor [Atorvastatin Calcium] Nausea Only    Reaction: nausea, weakness, pass blood   Oxycodone Other (See Comments)    Reaction:  Stomach pain    Aleve [Naproxen]     Reaction: severe stomach pain   Atorvastatin Nausea Only and Other (See Comments)    Weakness    Doxycycline     Stomach pain    Iodine Rash   Motrin [Ibuprofen]     Reaction: severe stomach pain   Tape Other (See Comments)    Causes pts skin to tear  Pt states that she is able to use paper tape.       Valdecoxib Swelling and Other (See Comments)    Pt states that her hands and feet swell.      Patient Measurements: Height: 5\' 3"  (160 cm) Weight: 70.1 kg (154 lb 8.7 oz) IBW/kg (Calculated) : 52.4 Heparin Dosing Weight: 66.9 kg  Vital Signs: Temp: 99 F (37.2 C) (05/12  0800) Temp Source: Oral (05/12 0800) BP: 124/90 (05/12 0800) Pulse Rate: 82 (05/12 0800)  Labs: Recent Labs    01/20/23 1045 01/21/23 0420 01/21/23 0420 01/21/23 1239 01/21/23 1503 01/21/23 1712 01/21/23 1936 01/22/23 0246 01/22/23 0813 01/22/23 0845  HGB  --  11.6*  --  10.3*  --   --  9.5* 9.3*  --  8.9*  HCT  --  37.3  --  32.5*  --   --  29.6* 29.8*  --  28.1*  PLT  --  152  --  172  --   --   --  143*  --   --   APTT 31  --   --   --   --   --   --   --   --   --   LABPROT 15.1  --   --  16.4*  --   --   --   --   --   --   INR 1.2  --   --  1.3*  --   --   --   --   --   --   CREATININE  --  2.36*  --  2.48*  --   --   --  2.68*  --   --   CKTOTAL  --   --   --  268*  --   --   --   --   --    --   TROPONINIHS  --   --    < > 45* 52* 61*  --   --  101*  --    < > = values in this interval not displayed.    Estimated Creatinine Clearance: 15.2 mL/min (A) (by C-G formula based on SCr of 2.68 mg/dL (H)).   Medical History: Past Medical History:  Diagnosis Date   Acid reflux    Acute respiratory failure (HCC) 04/09/2018   Anemia    Anxiety    Bowel obstruction (HCC)    Carotid artery disease (HCC)    Chronic heart failure with preserved ejection fraction (HFpEF) (HCC)    a. 03/2018 Echo: EF 60-65%, mildly dil LA/RA, mod dil RV, mod TR, PASP .   CKD (chronic kidney disease), stage IV (HCC)    stage IV   Closed fracture of one rib of right side 05/29/2020   Complete tear of left rotator cuff 10/16/2014   Coronary artery disease (non-obstructive)    a. 08/2016 Cath: LM 25, LAD 20p, LCX 15p, RCA nl, EF 55%, 2+ MR; 10/2017 MV: No isch/infarct, EF 55-65%.   Depression    Diabetes mellitus without complication (HCC)    Diabetic neuropathy (HCC)    Diarrhea 03/04/2022   Fatty liver    Gastritis without bleeding    GI bleed    Gout    Hypertension    Hypothyroidism    Low back pain    history of kyphoplasty t12-l1   Morbid obesity (HCC)    Multiple gastric polyps    history of gi bleed.  polyps removed   Osteoarthritis    Osteoporosis    PAF (paroxysmal atrial fibrillation) (HCC) 2016   a. On amio/eliquis (CHA2DS2VASc = 9).   Paroxysmal atrial fibrillation (HCC)    Presence of permanent cardiac pacemaker    Pulmonary hypertension (HCC)    a. 03/2018 Echo: PASP .   Sinus node dysfunction (HCC) 08/24/2017   Sinus node dysfunction (HCC)    a. MDT- upgraded  to dual chamber device Dec 2018- lead revision post upgrade dec 2018 (MDT [id number] Azure XT DR MRI).   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   Uncontrolled type 2 diabetes  mellitus with hypoglycemia, with long-term current use of insulin (HCC) 03/04/2022    Medications:  Scheduled:   allopurinol  100 mg Oral Daily   amiodarone  200 mg Oral Daily   Chlorhexidine Gluconate Cloth  6 each Topical Daily   cyanocobalamin  1,000 mcg Oral Daily   docusate sodium  100 mg Oral BID   folic acid  1 mg Intravenous Daily   gabapentin  100 mg Oral BID   hydrocortisone sod succinate (SOLU-CORTEF) inj  100 mg Intravenous Q12H   insulin aspart  0-15 Units Subcutaneous Q4H   levothyroxine  37.5 mcg Oral QAC breakfast   midodrine  5 mg Oral TID WC   oxybutynin  5 mg Oral TID   rOPINIRole  0.25 mg Oral QHS   sertraline  25 mg Oral Daily   sertraline  50 mg Oral Daily   thiamine  100 mg Oral Daily    Assessment: 82 yo female who [facility] to [facility] ER on 05/10 via EMS from [facility] with right hip pain after tripping and having a witness fall. She denied hitting her head or LOC. Pt does take eliquis for atrial fibrillation last dose 05/10 am per Med Rec.   Goal of Therapy:  Heparin level 0.3-0.5 units/ml aPTT 66 - 85s seconds Monitor platelets by anticoagulation protocol: Yes   Plan:  ---Start heparin infusion at 1200 units/hr (avoid bolus doses based on MD order/request) ---Check aPTT level in 8 hours (we will use aPTT to guide therapy until anti-Xa and aPTT correlate) ---Continue to monitor H&H and platelets  Lowella Bandy 01/22/2023,10:48 AM

## 2023-01-22 NOTE — Progress Notes (Addendum)
NAME:  Deborah Obrien, MRN:  [id number], DOB:  September 28, 1940, LOS: 2 ADMISSION DATE:  01/20/2023, CONSULTATION DATE: 01/21/2023 REFERRING MD: Dr. Laural Benes, CHIEF COMPLAINT: Hypotension    History of Present Illness:  This is an 82 yo female who presented to [facility] ER on 05/10 via EMS from [facility] with right hip pain after tripping and having a witness fall.  She denied hitting her head or LOC.  Pt does take eliquis for atrial fibrillation.  At baseline she ambulates with a walker.    ED Course  Upon arrival to the ER pt hypotensive with maps in the low 60's.  Right hip x-ray revealed comminuted intertrochanteric right hip fracture, with impaction and varus angulation at the fracture site.  Orthopedic Surgeon Dr. Allena Katz consulted, and pt admitted to the [facility] unit per hospitalist team pending right hip cephalomedullary nailing scheduled for 05/11.    ED significant lab results: CO2 21/glucose 173/BUN 42/creatinine 1.83/alk phos 151/AST 72/total bilirubin 1.7 ED meds: fentanyl (125 mcg)/1L NS bolus/zofran   Pertinent  Medical History  Paroxysmal Atrial Fibrillation on eliquis  GERD  Anemia  Anxiety Bowel Obstruction  CAD  Stage IV CKD Depression  Type II Diabetes Mellitus Gastritis  GI Bleed  HTN  Hypothyroidism  Chronic Back Pain  Morbid Obesity  Permanent Cardiac Pacermaker  Sinus Node Dysfunction  Stroke  TIA   Significant Hospital Events: Including procedures, antibiotic start and stop dates in addition to other pertinent events   05/10: Pt admitted to the [facility] unit with a right intertrochanteric hip fracture following a mechanical fall  05/11: Pt underwent intramedullary nailing of the right femur with cephalomedullary device. Postop pt hypotensive requiring levophed gtt. Pt also developed acute hypercapnic respiratory failure requiring Bipap. PCCM team consulted to assist with management 05/12: No acute events overnight.  Pt pending Cardiology and Nephrology  consults   Interim History / Subjective:  Pt transitioned off Bipap this am tolerating 2L O2 via nasal canula.  Remains on levophed gtt @21  mcg/min to maintain map 60 or higher will add vasopressin.    Objective   Blood pressure (!) 121/42, pulse 61, temperature 98.1 F (36.7 C), temperature source Oral, resp. rate (!) 25, height 5\' 3"  (1.6 m), weight 70.1 kg, SpO2 100 %.    FiO2 (%):  [28 %] 28 %   Intake/Output Summary (Last 24 hours) at 01/22/2023 0750 Last data filed at 01/22/2023 7829 Gross per 24 hour  Intake 2394.4 ml  Output 700 ml  Net 1694.4 ml   Filed Weights   01/20/23 1310 01/21/23 2100  Weight: 72 kg 70.1 kg    Examination: General: Acute on chronically-ill appearing female, NAD on 2L O2  HENT: Supple, no JVD  Lungs: Clear throughout, even, non labored  Cardiovascular: Atrial paced, hr 70, no m/r/g, 2+ radial/1+ distal pulses, trace right hip edema  Abdomen: +BS x4, soft, non tender, non distended  Extremities: Right hip fracture post repair, normal bulk  Skin: Right hip incision with honeycomb dressing dry/intact  Neuro: Slightly lethargic, oriented, following commands, PERRLA  GU: Indwelling foley catheter draining clear yellow urine   Resolved Hospital Problem list    Assessment & Plan:  #Acute metabolic encephalopathy~improving   - Correct metabolic derangements - Avoid sedating medication when able  - Frequent reorientation   #Shock: cardiogenic and hypovolemic; possible adrenal insufficiency  #HFpEF #Pulmonary HTN  Hx: Paroxysmal atrial fibrillation, permanent cardiac pacemaker, CAD, sinus node dysfunction  - Continuous telemetry monitoring  - Trend troponin's until  peaked  - Echo pending   - TSH 4.492; free T4 1.03 - CVP 10-12 will hold on additional iv fluids for now BNP 1,487.0 - Continue scheduled midodrine and prn levophed/vasopressin gtts to maintain map >65 - Cardiology consulted appreciate input - Concern for possible PE or fat embolism  unable to obtain CTA PE due to acute on chronic renal failure will start heparin gtt   #Acute hypercapnic respiratory failure suspect secondary to sedating medication  - Prn Bipap for hypercapnia and/or hypoxia  - Prn supplemental O2 for dyspnea and/or hypoxia  - Maintain O2 sats >92% - Follow CXR and ABG's  - Will r/o COVID  - Aggressive pulmonary hygiene when able   #Acute kidney injury superimposed on CKD stage IV~worsening  - Trend BMP  - Replace electrolytes as indicated  - Strict intake and output  - Avoid nephrotoxic medications  - Nephrology consulted appreciate input   #Anemia no obvious signs of active bleeding  - Trend CBC  - Folic acid and vitamin B12 levels within normal limits  - Fibrinogen/PT/INR within normal limits  - D-dimer 2.78 - VTE px: heparin gtt  - Monitor for s/sx of bleeding  - Transfuse for hgb <8  #Transaminitis likely secondary to hypotension~improving  #Chronically elevated alk phosphatase~resolved   [place] RUQ Limited 01/21/23: fatty liver and s/p cholecystectomy  - Trend hepatic function panel  - Avoid hepatoxic medications as able   #Leukocytosis no obvious signs of infection  - Trend WBC and monitor fever curve  - Trend PCT  - Follow cultures  - Will start empiric zosyn due to elevated pct pending culture results   #Right intertrochanteric hip fracture following a mechanical fall s/p intramedullary nail intertrochanteric  #Acute pain  - Orthopedic consulted appreciate input  - 12.5 mg fentanyl x1 dose pending bedside swallowing evaluation if pt passes will continue prn oxycodone  - PT/OT once hypotension resolves  #GERD  Hx Gastritis  - IV pepcid   #Type II Diabetes Mellitus  - CBG's q4hrs  - Changed from sensitive to moderate SSI   Best Practice (right click and "Reselect all SmartList Selections" daily)   Diet/type: NPO pending bedside swallow evaluation  DVT prophylaxis: SCD GI prophylaxis: H2B Lines: Yes and still needed   Foley: Yes and still needed  Code Status:  full code Last date of multidisciplinary goals of care discussion [01/22/2023]  05/12: Pt updated at bedside regarding current plan of care.  Will also update pts son Preet Harpham via telephone today   Labs   CBC: Recent Labs  Lab 01/20/23 1022 01/21/23 0420 01/21/23 1239 01/21/23 1936 01/22/23 0246  WBC 5.6 14.2* 14.4*  --  16.1*  NEUTROABS 3.5  --  13.4*  --  13.0*  HGB 13.0 11.6* 10.3* 9.5* 9.3*  HCT 39.2 37.3 32.5* 29.6* 29.8*  MCV 97.8 103.0* 101.6*  --  103.8*  PLT 160 152 172  --  143*    Basic Metabolic Panel: Recent Labs  Lab 01/20/23 1022 01/21/23 0420 01/21/23 1239 01/22/23 0246  NA 136 139 135 138  K 4.7 5.0 4.5 4.3  CL 102 107 108 107  CO2 21* 25 19* 23  GLUCOSE 173* 274* 284* 239*  BUN 42* 44* 48* 51*  CREATININE 1.83* 2.36* 2.48* 2.68*  CALCIUM 9.1 8.3* 7.5* 8.0*  MG  --   --  1.7 2.2  PHOS  --   --  5.6* 6.3*   GFR: Estimated Creatinine Clearance: 15.2 mL/min (A) (by C-G formula  based on SCr of 2.68 mg/dL (H)). Recent Labs  Lab 01/20/23 1022 01/21/23 0420 01/21/23 1239 01/21/23 1712 01/22/23 0246  PROCALCITON  --   --  2.12  --   --   WBC 5.6 14.2* 14.4*  --  16.1*  LATICACIDVEN  --   --  1.5 1.3  --     Liver Function Tests: Recent Labs  Lab 01/20/23 1022 01/21/23 0420 01/21/23 1239 01/22/23 0246  AST 72* 98* 84* 72*  ALT 37 61* 58* 40  ALKPHOS 151* 156* 133* 119  BILITOT 1.7* 1.5* 1.4* 0.7  PROT 7.3 6.5 5.8* 6.1*  ALBUMIN 4.1 3.5 3.2* 3.3*   No results for input(s): "LIPASE", "AMYLASE" in the last 168 hours. No results for input(s): "AMMONIA" in the last 168 hours.  ABG    Component Value Date/Time   PHART 7.22 (L) 01/22/2023 0306   PCO2ART 53 (H) 01/22/2023 0306   PO2ART 99 01/22/2023 0306   HCO3 21.7 01/22/2023 0306   TCO2 24 07/15/2020 0921   ACIDBASEDEF 6.5 (H) 01/22/2023 0306   O2SAT 99.2 01/22/2023 0306     Coagulation Profile: Recent Labs  Lab 01/20/23 1045  01/21/23 1239  INR 1.2 1.3*    Cardiac Enzymes: Recent Labs  Lab 01/21/23 1239  CKTOTAL 268*    HbA1C: Hemoglobin A1C  Date/Time Value Ref Range Status  09/26/2012 12:00 PM 7.5 (H) 4.2 - 6.3 % Final    Comment:    The American Diabetes Association recommends that a primary goal of therapy should be <7% and that physicians should reevaluate the treatment regimen in patients with HbA1c values consistently >8%.    Hgb A1c MFr Bld  Date/Time Value Ref Range Status  01/21/2023 04:20 AM 6.8 (H) 4.8 - 5.6 % Final    Comment:    (NOTE) Pre diabetes:          5.7%-6.4%  Diabetes:              >6.4%  Glycemic control for   <7.0% adults with diabetes   05/29/2022 10:29 PM 6.2 (H) 4.8 - 5.6 % Final    Comment:    (NOTE) Pre diabetes:          5.7%-6.4%  Diabetes:              >6.4%  Glycemic control for   <7.0% adults with diabetes     CBG: Recent Labs  Lab 01/21/23 1745 01/21/23 1935 01/21/23 2348 01/22/23 0255 01/22/23 0734  GLUCAP 261* 290* 230* 227* 176*    Review of Systems: Positives in BOLD   Gen: Denies fever, chills, weight change, fatigue, night sweats HEENT: Denies blurred vision, double vision, hearing loss, tinnitus, sinus congestion, rhinorrhea, sore throat, neck stiffness, dysphagia PULM: Denies shortness of breath, cough, sputum production, hemoptysis, wheezing CV: Denies chest pain, edema, orthopnea, paroxysmal nocturnal dyspnea, palpitations GI: Denies abdominal pain, nausea, vomiting, diarrhea, hematochezia, melena, constipation, change in bowel habits GU: Denies dysuria, hematuria, polyuria, oliguria, urethral discharge MUSC: postop right hip pain Endocrine: Denies hot or cold intolerance, polyuria, polyphagia or appetite change Derm: Denies rash, dry skin, scaling or peeling skin change Heme: Denies easy bruising, bleeding, bleeding gums Neuro: Denies headache, numbness, weakness, slurred speech, loss of memory or consciousness  Past  Medical History:  She,  has a past medical history of Acid reflux, Acute respiratory failure (HCC) (04/09/2018), Anemia, Anxiety, Bowel obstruction (HCC), Carotid artery disease (HCC), Chronic kidney disease (CKD) stage G3a/A1, moderately decreased glomerular filtration rate (GFR)  between 45-59 mL/min/1.73 square meter and albuminuria creatinine ratio less than 30 mg/g (HCC), Closed fracture of one rib of right side (05/29/2020), Complete tear of left rotator cuff (10/16/2014), Coronary artery disease (non-obstructive), Depression, Diabetes mellitus without complication (HCC), Diarrhea (03/04/2022), Fatty liver, Gastritis without bleeding, GI bleed, Gout, Hypertension, Hypothyroidism, Low back pain, Morbid obesity (HCC), Multiple gastric polyps, Neuromuscular disorder (HCC), Osteoarthritis, Osteoporosis, Pacemaker (04/09/2018), PAF (paroxysmal atrial fibrillation) (HCC) (2016), Paroxysmal atrial fibrillation (HCC), Paroxysmal Atrial flutter (HCC) (08/24/2017), Presence of permanent cardiac pacemaker, Sinus node dysfunction (HCC) (08/24/2017), Stroke (HCC), Tendinitis of upper biceps tendon of left shoulder (11/10/2019), TIA (transient ischemic attack), 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   Surgical History:   Past Surgical History:  Procedure Laterality Date   ABDOMINAL HYSTERECTOMY  1980   APPENDECTOMY     BACK SURGERY     2008. plate & screws    BACK SURGERY  2019   patient describes kyphoplasty for compression fractures, MD referral said fusion   BREAST BIOPSY Right 08/16   stereo fibroadenomatous change, neg for atypia   BREAST EXCISIONAL BIOPSY Left    neg   CARDIAC CATHETERIZATION Left 08/25/2016   Procedure: Left Heart Cath and Coronary Angiography;  Surgeon: Lamar Blinks, MD;  Location: [facility] INVASIVE CV LAB;  Service:  Cardiovascular;  Laterality: Left;   CARDIOVERSION N/A 06/28/2017   Procedure: CARDIOVERSION;  Surgeon: Lamar Blinks, MD;  Location: [facility] ORS;  Service: Cardiovascular;  Laterality: N/A;   CARDIOVERSION N/A 02/06/2018   Procedure: CARDIOVERSION;  Surgeon: Wendall Stade, MD;  Location: [facility] ENDOSCOPY;  Service: Cardiovascular;  Laterality: N/A;   CARDIOVERSION N/A 07/15/2020   Procedure: CARDIOVERSION;  Surgeon: Jake Bathe, MD;  Location: [facility] ENDOSCOPY;  Service: Cardiovascular;  Laterality: N/A;   CATARACT EXTRACTION, BILATERAL  2012   CHOLECYSTECTOMY     colon blockage  1999   COLON SURGERY  2000   removed 20% of colon. colon had collapsed   COLONOSCOPY  2016   polyps removed 2016   DORSAL COMPARTMENT RELEASE Left 09/14/2016   Procedure: RELEASE DORSAL COMPARTMENT (DEQUERVAIN);  Surgeon: Donato Heinz, MD;  Location: [facility] ORS;  Service: Orthopedics;  Laterality: Left;   ESOPHAGOGASTRODUODENOSCOPY N/A 01/27/2015   Procedure: ESOPHAGOGASTRODUODENOSCOPY (EGD);  Surgeon: Christena Deem, MD;  Location: [facility] ENDOSCOPY;  Service: Endoscopy;  Laterality: N/A;   ESOPHAGOGASTRODUODENOSCOPY (EGD) WITH PROPOFOL N/A 07/24/2015   Procedure: ESOPHAGOGASTRODUODENOSCOPY (EGD) WITH PROPOFOL;  Surgeon: Midge Minium, MD;  Location: [facility] ENDOSCOPY;  Service: Endoscopy;  Laterality: N/A;   ESOPHAGOGASTRODUODENOSCOPY (EGD) WITH PROPOFOL N/A 04/12/2018   Procedure: ESOPHAGOGASTRODUODENOSCOPY (EGD) WITH PROPOFOL;  Surgeon: Wyline Mood, MD;  Location: [facility] ENDOSCOPY;  Service: Gastroenterology;  Laterality: N/A;   ESOPHAGOGASTRODUODENOSCOPY (EGD) WITH PROPOFOL N/A 06/22/2018   Procedure: ESOPHAGOGASTRODUODENOSCOPY (EGD) WITH PROPOFOL;  Surgeon: Rachael Fee, MD;  Location: [facility] ENDOSCOPY;  Service: Endoscopy;  Laterality: N/A;   ESOPHAGOGASTRODUODENOSCOPY (EGD) WITH PROPOFOL N/A 07/09/2018   Procedure: ESOPHAGOGASTRODUODENOSCOPY (EGD) WITH PROPOFOL with resection of gastric polyps;  Surgeon: Wyline Mood, MD;   Location[facility] ENDOSCOPY;  Service: Gastroenterology;  Laterality: N/A;   ESOPHAGOGASTRODUODENOSCOPY (EGD) WITH PROPOFOL N/A 05/17/2019   Procedure: ESOPHAGOGASTRODUODENOSCOPY (EGD) WITH PROPOFOL;  Surgeon: Wyline Mood, MD;  Location: [facility] ENDOSCOPY;  Service: Gastroenterology;  Laterality: N/A;   EUS N/A 06/22/2018   Procedure: UPPER ENDOSCOPIC ULTRASOUND (EUS) RADIAL;  Surgeon: Rachael Fee, MD;  Location: [facility] ENDOSCOPY;  Service: Endoscopy;  Laterality: N/A;   EYE SURGERY Bilateral    cataract extractions   HUMERUS IM NAIL Left 06/02/2022   Procedure: INTRAMEDULLARY (IM) NAIL HUMERAL;  Surgeon: Christena Flake, MD;  Location: [facility] ORS;  Service: Orthopedics;  Laterality: Left;   JOINT REPLACEMENT Bilateral 2014   Bilateral Knee replacement   LEAD REVISION/REPAIR N/A 08/29/2017   Procedure: LEAD REVISION/REPAIR;  Surgeon: Regan Lemming, MD;  Location: [facility] INVASIVE CV LAB;  Service: Cardiovascular;  Laterality: N/A;   OOPHORECTOMY     PACEMAKER INSERTION Left 07/01/2015   Procedure: INSERTION PACEMAKER;  Surgeon: Marcina Millard, MD;  Location: [facility] ORS;  Service: Cardiovascular;  Laterality: Left;   PACEMAKER REVISION N/A 08/28/2017   Procedure: PACEMAKER REVISION;  Surgeon: Duke Salvia, MD;  Location: [facility] INVASIVE CV LAB;  Service: Cardiovascular;  Laterality: N/A;   REPLACEMENT TOTAL KNEE BILATERAL     SHOULDER ARTHROSCOPY WITH ROTATOR CUFF REPAIR AND OPEN BICEPS TENODESIS Left 11/21/2019   Procedure: LEFT SHOULDER ARTHROSCOPY WITH DEBRIDEMENT, DECOMPRESSION, ROTATOR CUFF REPAIR AND BICEPS TENOLYSIS;  Surgeon: Christena Flake, MD;  Location: [facility] ORS;  Service: Orthopedics;  Laterality: Left;   SHOULDER ARTHROSCOPY WITH SUBACROMIAL DECOMPRESSION Left 2013   TEE WITHOUT CARDIOVERSION N/A 06/28/2017   Procedure: TRANSESOPHAGEAL ECHOCARDIOGRAM (TEE);  Surgeon: Lamar Blinks, MD;  Location: [facility] ORS;  Service: Cardiovascular;  Laterality: N/A;     Social History:   reports that  she has never smoked. She has never used smokeless tobacco. She reports that she does not drink alcohol and does not use drugs.   Family History:  Her family history includes CAD in her mother; Cancer in her father; Diabetes Mellitus II in her brother and mother; Heart attack in her mother; Stroke in her brother. There is no history of Breast cancer.   Allergies Allergies  Allergen Reactions   Cephalosporins Other (See Comments), Diarrhea and Nausea Only    Reaction:  Unknown  Other reaction(s): Other (see comments) Other reaction(s): Other (See Comments) Other Reaction: Intolerance  Reaction:  Stomach pain  Other Reaction: Intolerance Reaction:  Unknown    Fish Allergy Shortness Of Breath    All kinds of fish. Severe vomitting even with smell of fish.    Macrolides And Ketolides Other (See Comments)    Severe stomach pain (mycins)   Meperidine Shortness Of Breath, Nausea Only and Other (See Comments)    Stomach pain    Other Nausea Only, Swelling, Rash, Anaphylaxis, Diarrhea, Shortness Of Breath and Other (See Comments)    Allergen: NON-STEROIDS, bextra - hands and feet swell  Reaction:  Stomach pain   Other Reaction: Intolerance   Prednisone Anaphylaxis and Other (See Comments)    (facial swelling/redness/burning)Increases pts blood sugar  Pt states that she is allergic to all steroids.     Shellfish Allergy Anaphylaxis, Shortness Of Breath, Diarrhea, Nausea Only and Rash    Stomach pain    Sulfa Antibiotics Shortness Of Breath, Diarrhea, Nausea Only and Other (See Comments)    Reaction:  Stomach pain   Reaction:  Stomach pain  Other Reaction: Intolerance   Sulfacetamide Sodium Diarrhea, Nausea Only, Other (See Comments) and Shortness Of Breath    Reaction:  Stomach pain    Telbivudine Other (See Comments)    Unknown reaction   Uloric [Febuxostat] Anaphylaxis    Locks pt's body up    Aspirin Rash and Other (See Comments)    Stomach pain (tolerates lower doses)    Celecoxib Other (See Comments)  GI bleed, weakness, and stomach pain.     Cephalexin Diarrhea, Nausea Only and Other (See Comments)    stomach pain    Codeine Diarrhea, Nausea Only and Other (See Comments)    Reaction:  Stomach pain Pt tolerates morphine    Dilaudid [Hydromorphone Hcl] Other (See Comments)    Reactions: easy to overdose - blood pressure drops really low   Erythromycin Diarrhea, Nausea Only and Other (See Comments)    Reaction:  Stomach pain    Iodinated Contrast Media Rash and Other (See Comments)    Pt states that she is unable to have because she has chronic kidney disease.   CKD Pt states that she is unable to have because she has chronic kidney disease.   CKD    Lipitor [Atorvastatin Calcium] Nausea Only    Reaction: nausea, weakness, pass blood   Oxycodone Other (See Comments)    Reaction:  Stomach pain    Aleve [Naproxen]     Reaction: severe stomach pain   Atorvastatin Nausea Only and Other (See Comments)    Weakness    Doxycycline     Stomach pain    Iodine Rash   Motrin [Ibuprofen]     Reaction: severe stomach pain   Tape Other (See Comments)    Causes pts skin to tear  Pt states that she is able to use paper tape.       Valdecoxib Swelling and Other (See Comments)    Pt states that her hands and feet swell.       Home Medications  Prior to Admission medications   Medication Sig Start Date End Date Taking? Authorizing Provider  acetaminophen (TYLENOL) 325 MG tablet Take 2 tablets (650 mg total) by mouth every 6 (six) hours as needed for mild pain, moderate pain or fever (or Fever >/= 101). Patient taking differently: Take 325 mg by mouth every 6 (six) hours as needed for mild pain, moderate pain or fever (or Fever >/= 101). 06/08/22  Yes Lurene Shadow, MD  acetaminophen (TYLENOL) 500 MG tablet Take 500 mg by mouth in the morning, at noon, and at bedtime.   Yes [provider]  albuterol (VENTOLIN HFA) 108 (90 Base) MCG/ACT inhaler  Inhale 2 puffs into the lungs every 6 (six) hours as needed for wheezing or shortness of breath. Ventolin   Yes [provider]  allopurinol (ZYLOPRIM) 100 MG tablet Take 100 mg by mouth daily.   Yes [provider]  amiodarone (PACERONE) 200 MG tablet TAKE 1 TABLET BY MOUTH ONCE DAILY . APPOINTMENT REQUIRED FOR FUTURE REFILLS 02/14/22  Yes Duke Salvia, MD  apixaban (ELIQUIS) 2.5 MG TABS tablet Take 1 tablet (2.5 mg total) by mouth 2 (two) times daily. 03/06/22  Yes Sunnie Nielsen, DO  ciprofloxacin (CIPRO) 500 MG tablet Take 500 mg by mouth daily. 05/07 - 05/14   Yes [provider]  docusate sodium (COLACE) 100 MG capsule Take 100 mg by mouth 2 (two) times daily.   Yes [provider]  famotidine (PEPCID) 10 MG tablet Take 10 mg by mouth daily.   Yes [provider]  Fluticasone Furoate (FLONASE SENSIMIST NA) Place 1 spray into the nose daily.   Yes [provider]  furosemide (LASIX) 40 MG tablet Take 40 mg by mouth daily.   Yes [provider]  gabapentin (NEURONTIN) 100 MG capsule Take 100 mg by mouth 2 (two) times daily. 05/07 - 05/11   Yes [provider]  HUMALOG  100 UNIT/ML injection Inject 0-12 Units into the skin 3 (three) times daily with meals. Sliding scale 05/08/22  Yes [provider]  LANTUS 100 UNIT/ML injection 16 Units at bedtime. 05/10/22  Yes [provider]  Levothyroxine Sodium 37.5 MCG CAPS Take 37.5 mcg by mouth daily before breakfast. 01/05/23  Yes Duke Salvia, MD  midodrine (PROAMATINE) 5 MG tablet Take 5 mg by mouth 3 (three) times daily with meals. 01/18/23 07/17/23 Yes [provider]  oxybutynin (DITROPAN) 5 MG tablet Take 5 mg by mouth 3 (three) times daily. Breakfast & bedtime   Yes [provider]  Polyethyl Glycol-Propyl Glycol 0.4-0.3 % SOLN Place 1 drop into both eyes 2 (two) times daily as needed (dry eyes).   Yes [provider]  pravastatin  (PRAVACHOL) 20 MG tablet Take 20 mg by mouth at bedtime.  07/25/18  Yes [provider]  Prenat-FeCbn-FeBisg-FA-Omega (MULTIVITAMIN/MINERALS PO) Take 1 tablet by mouth daily.   Yes [provider]  rOPINIRole (REQUIP) 0.25 MG tablet Take 1 tablet (0.25 mg total) by mouth at bedtime. 06/08/22  Yes Lurene Shadow, MD  sertraline (ZOLOFT) 25 MG tablet Take 25 mg by mouth daily.   Yes [provider]  sertraline (ZOLOFT) 50 MG tablet Take 50 mg by mouth daily.   Yes [provider]  talc powder Apply 1 application topically as needed (under the breast irritation).   Yes [provider]  traZODone (DESYREL) 50 MG tablet Take 50 mg by mouth at bedtime as needed for sleep. 06/07/18  Yes [provider]  traZODone (DESYREL) 50 MG tablet Take 25 mg by mouth 2 (two) times daily as needed for sleep. Do not give within 2 hours of night dose   Yes [provider]  traZODone (DESYREL) 50 MG tablet Take 50 mg by mouth at bedtime.   Yes [provider]  vitamin B-12 (CYANOCOBALAMIN) 1000 MCG tablet Take 1,000 mcg by mouth daily.   Yes [provider]  alum & mag hydroxide-simeth (MAALOX/MYLANTA) 200-200-20 MG/5ML suspension Take 10 mLs by mouth 3 (three) times daily as needed for indigestion or heartburn.    [provider]  senna (SENOKOT) 8.6 MG TABS tablet Take 1 tablet by mouth every Monday, Wednesday, and Friday.    [provider]  Simethicone LIQD 10 mLs by Does not apply route 3 (three) times daily as needed. 200-200-20 mg/5 mL Patient not taking: Reported on 01/20/2023    [provider]     Critical care time: 50 minutes      Zada Girt, AGNP  Pulmonary/Critical Care Pager [phone] (please enter 7 digits) PCCM Consult Pager [phone] (please enter 7 digits)

## 2023-01-22 NOTE — Progress Notes (Signed)
Corrected Bipap mask leak Increased IPAP to 17 Increased rate to 15 Pt asleep. No distress apparent

## 2023-01-22 NOTE — Progress Notes (Signed)
Subjective: 1 Day Post-Op Procedure(s) (LRB): INTRAMEDULLARY (IM) NAIL INTERTROCHANTERIC (Right) Patient reports pain as moderate.   Patient seen in rounds with Dr. Allena Katz. Patient is  currently in moderate distress during the visit.  She is nervous that she has a lot going on including being COVID-positive and having CKD.  She was asking if she is going to be okay.  I discussed with the patient that we have many people looking after her and to take one step at a time .  PT order is placed, was seen earlier today by physical therapy who put off working with the patient given the fact that she is on pressors to help with her blood pressure.  States they will reevaluate tomorrow  Objective: Vital signs in last 24 hours: Temp:  [97.8 F (36.6 C)-100 F (37.8 C)] 99 F (37.2 C) (05/12 0800) Pulse Rate:  [58-82] 68 (05/12 1145) Resp:  [10-33] 21 (05/12 1145) BP: (80-140)/(24-111) 102/58 (05/12 0830) SpO2:  [83 %-100 %] 100 % (05/12 1145) Arterial Line BP: (83-184)/(31-72) 184/59 (05/12 1145) FiO2 (%):  [28 %] 28 % (05/12 0338) Weight:  [70.1 kg] 70.1 kg (05/11 2100)  Intake/Output from previous day:  Intake/Output Summary (Last 24 hours) at 01/22/2023 1217 Last data filed at 01/22/2023 1151 Gross per 24 hour  Intake 2162.72 ml  Output 1250 ml  Net 912.72 ml    Intake/Output this shift: Total I/O In: 193.1 [I.V.:143.2; IV Piggyback:49.9] Out: 650 [Urine:650]  Labs: Recent Labs    01/21/23 0420 01/21/23 1239 01/21/23 1936 01/22/23 0246 01/22/23 0845  HGB 11.6* 10.3* 9.5* 9.3* 8.9*   Recent Labs    01/21/23 1239 01/21/23 1936 01/22/23 0246 01/22/23 0845  WBC 14.4*  --  16.1*  --   RBC 3.20*  --  2.87*  --   HCT 32.5*   < > 29.8* 28.1*  PLT 172  --  143*  --    < > = values in this interval not displayed.   Recent Labs    01/21/23 1239 01/22/23 0246  NA 135 138  K 4.5 4.3  CL 108 107  CO2 19* 23  BUN 48* 51*  CREATININE 2.48* 2.68*  GLUCOSE 284* 239*  CALCIUM  7.5* 8.0*   Recent Labs    01/20/23 1045 01/21/23 1239  INR 1.2 1.3*    EXAM General - Patient is Alert, Appropriate, and Oriented Extremity - Neurologically intact ABD soft Neurovascular intact Sensation intact distally Intact pulses distally Dorsiflexion/Plantar flexion intact No cellulitis present Compartment soft There is not appear to be any hematoma formation along the hip-there is minimal to no ecchymosis noted on physical exam Noted pain to palpation over the right hip, incision site and mid femur. Dressing -  dressing is intact, there is a minimal amount of blood on the honeycomb, does not need to be changed at this moment Motor Function - intact, moving foot and toes well on exam.  Able to plantar and dorsiflex with good range of motion and strength.  Patient has difficulty with straight leg raise due to the pain.  Patient is neurovascularly intact -sensation across all right lower leg dermatomes is intact and pedal pulses appreciated.  Past Medical History:  Diagnosis Date   Acid reflux    Acute respiratory failure (HCC) 04/09/2018   Anemia    Anxiety    Bowel obstruction (HCC)    Carotid artery disease (HCC)    Chronic heart failure with preserved ejection fraction (HFpEF) (HCC)  a. 03/2018 Echo: EF 60-65%, mildly dil LA/RA, mod dil RV, mod TR, PASP ; b. 01/2023 Echo: EF 60-65%, no rwma, low-nl RV fxn, RVSP 51.37mmHg. Mild MR. Mod-sev TR.   CKD (chronic kidney disease), stage IV (HCC)    stage IV   Closed fracture of one rib of right side 05/29/2020   Complete tear of left rotator cuff 10/16/2014   Coronary artery disease (non-obstructive)    a. 08/2016 Cath: LM 25, LAD 20p, LCX 15p, RCA nl, EF 55%, 2+ MR; 10/2017 MV: No isch/infarct, EF 55-65%.   Depression    Diabetes mellitus without complication (HCC)    Diabetic neuropathy (HCC)    Diarrhea 03/04/2022   Fatty liver    Gastritis without bleeding    GI bleed    Gout    Hypertension    Hypothyroidism     Low back pain    history of kyphoplasty t12-l1   Morbid obesity (HCC)    Multiple gastric polyps    history of gi bleed.  polyps removed   Osteoarthritis    Osteoporosis    PAF (paroxysmal atrial fibrillation) (HCC) 2016   a. On amio/eliquis (CHA2DS2VASc = 9).   Paroxysmal atrial fibrillation (HCC)    Presence of permanent cardiac pacemaker    Pulmonary hypertension (HCC)    a. 03/2018 Echo: PASP .   Sinus node dysfunction (HCC) 08/24/2017   Sinus node dysfunction (HCC)    a. MDT- upgraded to dual chamber device Dec 2018- lead revision post upgrade dec 2018 (MDT [id number] Azure XT DR MRI).   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   Uncontrolled type 2 diabetes mellitus with hypoglycemia, with long-term current use of insulin (HCC) 03/04/2022    Assessment/Plan: 1 Day Post-Op Procedure(s) (LRB): INTRAMEDULLARY (IM) NAIL INTERTROCHANTERIC (Right) Principal Problem:   Shock (HCC) Active Problems:   Permanent atrial fibrillation (HCC)   Gastro-esophageal reflux disease without esophagitis   Gout   Hyperlipemia, mixed   CAD (coronary artery disease)   UTI (urinary tract infection)   Hypothyroidism   Acute renal failure superimposed on stage 4 chronic kidney disease (HCC)   Chronic heart failure with preserved ejection fraction (HCC)   Transaminitis   Hypotension after procedure   Closed fracture of right hip (HCC)   Nausea   Pulmonary hypertension (HCC)   Metabolic acidosis COVID-positive  Estimated body mass index is 27.38 kg/m as calculated from the following:   Height as of this encounter: 5\' 3"  (1.6 m).   Weight as of this encounter: 70.1 kg. Up with therapy -once the patient is more stable, urge to get the patient moving with physical therapy, WBAT  Patient's incision site looks good, with minimal sanguinous drainage.  No signs  of infection. Patient is neurovascularly intact on her right leg   DVT Prophylaxis -  may resume baseline Eliquis.  However is being currently worked up by cardiology, pharmacy and inpatient medicine for heparin infusion Weight Bearing As Tolerated right leg Can work with therapy once stable enough to do so  Labs and vitals reviewed - has been soft on BP managed with pressors. HGB is steadily dropping - most recent 8.9 (9.3 drawn 6 hours prior) - no formation of hematoma formation in the patients leg - recheck with labs already placed by inpatient medicine.  Will continue to work with inpatient medicine, cardiology and pharmacy to help  manage this patient's other comorbidities.   Rayburn Go, PA-C [facility] 01/22/2023, 12:17 PM

## 2023-01-22 NOTE — Progress Notes (Signed)
OT Cancellation Note  Patient Details Name: Deborah Obrien MRN: [id number] DOB: 01-Mar-1941   Cancelled Treatment:    Reason Eval/Treat Not Completed: Patient not medically ready. OT orders received, chart reviewed. Per conversation with nurse and NP, OT to hold evaluation today as pt is on multiple pressors and continues to have decreased BP levels. Will re-attempt OT evaluation tomorrow, as appropriate.   Gerrie Nordmann 01/22/2023, 12:21 PM

## 2023-01-22 NOTE — Progress Notes (Signed)
*  PRELIMINARY RESULTS* Echocardiogram 2D Echocardiogram has been performed.  Cristela Blue 01/22/2023, 7:45 AM

## 2023-01-22 NOTE — Progress Notes (Signed)
PHARMACIST - PHYSICIAN COMMUNICATION   CONCERNING: Hydrocortisone  IV   Current order:  Hydrocortisone  IV  50mg  every 6 hours     DESCRIPTION: Per [name] Protocol:   IV Hydrocortisone  will be converted to either a q12h or q24h frequency with the same total daily dose (TDD).   Order has been adjusted to: Hydrocortisone IV 100mg  every 12 hours   Gardner Candle , PharmD, BCPS Clinical Pharmacist  01/22/2023 7:50 AM

## 2023-01-22 NOTE — Consult Note (Signed)
Consultation Note Date: 01/22/2023   Patient Name: Deborah Obrien  DOB: 1941-07-30  MRN: [id number]  Age / Sex: 82 y.o., female  PCP: Deborah Florida, FNP Referring Physician: Raechel Chute, MD  Reason for Consultation: Establishing goals of care   HPI/Brief Hospital Course: 82 y.o. female  with past medical history of paroxsymal atrial fibrillation on Eliquis, sinus node dysfunction s/p pacemaker palcement, HFpEF, nonobstructive CAD, pulmonary HTN, CKD stage IV, T2DM, TIA, anxiety, depression, RLS and hypothyroidism admitted from [facility] on 01/20/2023 following a mechanical fall resulting in right hip fracture.  Underwent IM nailing of right femur with cephalomedullary device on 5/11 Post op complications including hypotension requiring vasopressor support, acute hypercapnic respiratory failure requiring bipap therapy and encephalopathy   Incidentally found to be COVID (+) 5/12  Concern due to worsening renal function with underlying CKD stage IV  Concern for possible PE or fat embolus-unable to obtain CTA due to renal function  Palliative medicine was consulted for assisting with goals of care conversations.  Subjective:  Extensive chart review has been completed prior to meeting patient including labs, vital signs, imaging, progress notes, orders, and available advanced directive documents from current and previous encounters.  Introduced myself as a Deborah Obrien as a member of the palliative care team. Explained palliative medicine is specialized medical care for people living with serious illness. It focuses on providing relief from the symptoms and stress of a serious illness. The goal is to improve quality of life for both the patient and the family.   Deborah Obrien remains encephalopathic and without full capacity to be involved in goals of care conversations regarding complex medical decision making. Appears fidgety, CCU team  working to address pain and delirium. Remains on vasopressor support. Has been successfully weaned off vasopressor support to [place].  Met with son-Deborah Obrien outside of room in CCU. Deborah Obrien shares that he has been appointed [employer] and will work on having a copy sent in for review and uploading to EMR. Deborah Obrien shares he has 2 brothers, Deborah Obrien and Deborah Obrien that both live out of town but he includes them in decision making.  Deborah Obrien shares that Deborah Obrien has been a resident at [facility] (ALF) since November 2023. Prior to that she was living at home with her husband. Unfortunately her husband has advanced dementia and was placed at [facility] for LTC. Deborah Obrien was on her way to visit her husband when she fell prior to admission. Deborah Obrien shares Deborah Obrien is independent with ambulating, utilizes a walker and independently performs ADL's. He shares it took some time for her to adjust to ALF but recently she has been thriving in that environment. At baseline her mentation is intact.  Deborah Obrien shares his understanding of current medical condition and most recent updates. We discussed multiple possible contributing factors to her encephalopathy/delirium. Deborah Obrien shares he is aware of his mothers multiple medical conditions. She recently had an appointment with her nephrologist and he feels that were becoming more concerned regarding her kidney function and planned to monitor her more closely. He is not sure if they have ever mentioned or discussed dialysis with her in the past.  Attempted to illicit goals of care. Deborah Obrien shares his mother has made it clear to him that she would not want long term life preserving interventions such as tracheostomy or PEG placement. She has alluded in the past to wanting to be resuscitated if she were able to survive.  We discussed code status-Full Code versus Do  Not Resuscitate. Encouraged Deborah Obrien to consider DNR/DNI status understanding evidenced based poor outcomes in similar hospitalized  patients, as the cause of the arrest is likely associated with chronic/terminal disease rather than a reversible acute cardio-pulmonary event.  He voiced his understanding. At this time he wishes to continue with Full Code status but will have a conversation with both of his brothers.  Also encouraged their conversation to include initiating dialysis if indicated given worsening renal function.  Deborah Obrien appreciative of conversation. Plans to have a conversation with his brothers today and will be in touch with decisions made. Deborah Obrien able to email copies of Advanced Directives to be uploaded to chart.  Later visited and Deborah Obrien shared at this time he and his brothers wish to keep Full Code status but remain open to ongoing conversations if there is a continual decline or with further decompensation.   I discussed importance of continued conversations with family/support persons and all members of their medical team regarding overall plan of care and treatment options ensuring decisions are in alignment with patients goals of care.  All questions/concerns addressed. Emotional support provided to patient/family/support persons. PMT will continue to follow and support patient as needed.  Objective: Primary Diagnoses: Present on Admission:  CAD (coronary artery disease)  Chronic heart failure with preserved ejection fraction (HCC)  Gastro-esophageal reflux disease without esophagitis  Permanent atrial fibrillation (HCC)  Gout  Hypothyroidism  Hyperlipemia, mixed  UTI (urinary tract infection)  Acute renal failure superimposed on stage 4 chronic kidney disease (HCC)   Vital Signs: BP (!) 102/58   Pulse 68   Temp 99 F (37.2 C) (Oral)   Resp (!) 21   Ht 5\' 3"  (1.6 m)   Wt 70.1 kg   SpO2 100%   BMI 27.38 kg/m  Pain Scale: 0-10 POSS *See Group Information*: 2-Acceptable,Slightly drowsy, easily aroused Pain Score: 6    IO: Intake/output summary:  Intake/Output Summary (Last 24 hours) at  01/22/2023 1227 Last data filed at 01/22/2023 1151 Gross per 24 hour  Intake 2162.72 ml  Output 1250 ml  Net 912.72 ml    LBM: Last BM Date : 01/20/23 Baseline Weight: Weight: 72 kg Most recent weight: Weight: 70.1 kg      Assessment and Plan  SUMMARY OF RECOMMENDATIONS   Full Code/Full Scope Ongoing GOC conversations needed with time PMT to continue to follow for ongoing needs and support  Discussed With: CCM team and nursing staff   Thank you for this consult and allowing Palliative Medicine to participate in the care of Joaquim Nam. Palliative medicine will continue to follow and assist as needed.   Time Total: 75 minutes  Time spent includes: Detailed review of medical records (labs, imaging, vital signs), medically appropriate exam (mental status, respiratory, cardiac, skin), discussed with treatment team, counseling and educating patient, family and staff, documenting clinical information, medication management and coordination of care.   Signed by: Leeanne Deed, DNP, AGNP-C Palliative Medicine    Please contact Palliative Medicine Team phone at [phone] for questions and concerns.  For individual provider: See Loretha Stapler

## 2023-01-22 NOTE — Progress Notes (Signed)
Updated pts son Blayze Kober regarding pts condition and current plan of care at bedside.  Zada Girt, AGNP  Pulmonary/Critical Care Pager [phone] (please enter 7 digits) PCCM Consult Pager [phone] (please enter 7 digits)

## 2023-01-22 NOTE — Progress Notes (Signed)
[facility] CONSULT NOTE    Date: 01/22/2023                  Patient Name:  Deborah Obrien  MRN: [id number]  DOB: [birth year]  Age / Sex: 82 y.o., female         PCP: Melonie Florida, FNP                 Service Requesting Consult: ICU                  Reason for Consult: Acute kidney injury             History of Present Illness: Patient is a 82 y.o. female with a PMHx of hypertension, atrial fibrillation, congestive heart failure, diabetes, peripheral vascular disease, chronic kidney disease, hyperlipidemia and anemia now admitted to the hospital with history of fall sustaining hip fracture.  She had repair of the fracture.  Patient also is COVID-positive.  Renal evaluation was requested secondary to worsening renal insufficiency and decreased urine output.  Medications: Outpatient medications: Medications Prior to Admission  Medication Sig Dispense Refill Last Dose   acetaminophen (TYLENOL) 325 MG tablet Take 2 tablets (650 mg total) by mouth every 6 (six) hours as needed for mild pain, moderate pain or fever (or Fever >/= 101). (Patient taking differently: Take 325 mg by mouth every 6 (six) hours as needed for mild pain, moderate pain or fever (or Fever >/= 101).)   prn at unk   acetaminophen (TYLENOL) 500 MG tablet Take 500 mg by mouth in the morning, at noon, and at bedtime.   01/20/2023 at 0757   albuterol (VENTOLIN HFA) 108 (90 Base) MCG/ACT inhaler Inhale 2 puffs into the lungs every 6 (six) hours as needed for wheezing or shortness of breath. Ventolin   prn at unk   allopurinol (ZYLOPRIM) 100 MG tablet Take 100 mg by mouth daily.   01/20/2023 at 0757   amiodarone (PACERONE) 200 MG tablet TAKE 1 TABLET BY MOUTH ONCE DAILY . APPOINTMENT REQUIRED FOR FUTURE REFILLS 90 tablet 3 01/20/2023 at 0757   apixaban (ELIQUIS) 2.5 MG TABS tablet Take 1 tablet (2.5 mg total) by mouth 2 (two) times daily. 60 tablet 0 01/20/2023 at 0757   ciprofloxacin (CIPRO) 500 MG  tablet Take 500 mg by mouth daily. 05/07 - 05/14   01/19/2023 at 1649   docusate sodium (COLACE) 100 MG capsule Take 100 mg by mouth 2 (two) times daily.   01/20/2023 at 0757   famotidine (PEPCID) 10 MG tablet Take 10 mg by mouth daily.   01/20/2023 at 0757   Fluticasone Furoate (FLONASE SENSIMIST NA) Place 1 spray into the nose daily.   01/20/2023 at 0757   furosemide (LASIX) 40 MG tablet Take 40 mg by mouth daily.   01/20/2023 at 0757   gabapentin (NEURONTIN) 100 MG capsule Take 100 mg by mouth 2 (two) times daily. 05/07 - 05/11   01/20/2023 at 0757   HUMALOG 100 UNIT/ML injection Inject 0-12 Units into the skin 3 (three) times daily with meals. Sliding scale   01/20/2023 at 0757   LANTUS 100 UNIT/ML injection 16 Units at bedtime.   01/19/2023 at 1954   Levothyroxine Sodium 37.5 MCG CAPS Take 37.5 mcg by mouth daily before breakfast. 90 capsule 1 01/20/2023 at 0512   midodrine (PROAMATINE) 5 MG tablet Take 5 mg by mouth 3 (three) times daily with meals.   01/20/2023 at 0757   oxybutynin (DITROPAN) 5  MG tablet Take 5 mg by mouth 3 (three) times daily. Breakfast & bedtime   01/20/2023 at 0757   Polyethyl Glycol-Propyl Glycol 0.4-0.3 % SOLN Place 1 drop into both eyes 2 (two) times daily as needed (dry eyes).   prn at unk   pravastatin (PRAVACHOL) 20 MG tablet Take 20 mg by mouth at bedtime.   2 01/19/2023 at 1954   Prenat-FeCbn-FeBisg-FA-Omega (MULTIVITAMIN/MINERALS PO) Take 1 tablet by mouth daily.   01/20/2023 at 0757   rOPINIRole (REQUIP) 0.25 MG tablet Take 1 tablet (0.25 mg total) by mouth at bedtime.   01/19/2023 at 1954   sertraline (ZOLOFT) 25 MG tablet Take 25 mg by mouth daily.   01/20/2023 at 0757   sertraline (ZOLOFT) 50 MG tablet Take 50 mg by mouth daily.   01/20/2023 at 0757   talc powder Apply 1 application topically as needed (under the breast irritation).   prn at unk   traZODone (DESYREL) 50 MG tablet Take 50 mg by mouth at bedtime as needed for sleep.   prn at unk   traZODone (DESYREL) 50 MG  tablet Take 25 mg by mouth 2 (two) times daily as needed for sleep. Do not give within 2 hours of night dose   prn at unk   traZODone (DESYREL) 50 MG tablet Take 50 mg by mouth at bedtime.   01/19/2023 at 1954   vitamin B-12 (CYANOCOBALAMIN) 1000 MCG tablet Take 1,000 mcg by mouth daily.   01/20/2023 at 0757   alum & mag hydroxide-simeth (MAALOX/MYLANTA) 200-200-20 MG/5ML suspension Take 10 mLs by mouth 3 (three) times daily as needed for indigestion or heartburn.   01/17/2023 at 1056   senna (SENOKOT) 8.6 MG TABS tablet Take 1 tablet by mouth every Monday, Wednesday, and Friday.   01/18/2023 at 1926   Simethicone LIQD 10 mLs by Does not apply route 3 (three) times daily as needed. 200-200-20 mg/5 mL (Patient not taking: Reported on 01/20/2023)   Not Taking    Discontinued Meds:   Medications Discontinued During This Encounter  Medication Reason   fentaNYL (SUBLIMAZE) injection 50 mcg    HYDROmorphone (DILAUDID) injection 0.5 mg    bupivacaine (MARCAINE) 20 mL, bupivacaine liposome (EXPAREL) 1.3 % 20 mL Patient Discharge   0.9 % irrigation (POUR BTL) Patient Discharge   ciprofloxacin (CIPRO) IVPB 400 mg    fentaNYL (SUBLIMAZE) injection 25-50 mcg Patient Transfer   acetaminophen (OFIRMEV) IV 1,000 mg Patient Transfer   promethazine (PHENERGAN) injection 6.25 mg Patient Transfer   droperidol (INAPSINE) 2.5 MG/ML injection 0.625 mg Patient Transfer   norepinephrine (LEVOPHED) 4mg  in (0.016 mg/mL) premix infusion Patient Transfer   naloxone (NARCAN) injection 0.02 mg Patient Transfer   ondansetron (ZOFRAN) tablet 4 mg    ondansetron (ZOFRAN) injection 4 mg    ondansetron (ZOFRAN) injection 4 mg    famotidine (PEPCID) tablet 10 mg Duplicate   0.9 %  sodium chloride infusion    0.9 %  sodium chloride infusion    insulin aspart (novoLOG) injection 0-9 Units    HYDROmorphone (DILAUDID) injection 0.5 mg    HYDROmorphone (DILAUDID) injection 0.25-0.5 mg    hydrocortisone sodium succinate  (SOLU-CORTEF) 100 MG injection 50 mg P&T Policy: Therapeutic Substitute   traZODone (DESYREL) tablet 50 mg    traZODone (DESYREL) tablet 50 mg    apixaban (ELIQUIS) tablet 2.5 mg    acetaminophen (TYLENOL) tablet 1,000 mg    pravastatin (PRAVACHOL) tablet 20 mg Lab parameters not met   amiodarone (PACERONE) tablet  200 mg    nirmatrelvir/ritonavir (renal dosing) (PAXLOVID) 2 tablet     Current medications: Current Facility-Administered Medications  Medication Dose Route Frequency Provider Last Rate Last Admin   0.9 %  sodium chloride infusion   Intravenous PRN Ezequiel Essex, NP 10 mL/hr at 01/22/23 1151 Infusion Verify at 01/22/23 1151   acetaminophen (OFIRMEV) IV 1,000 mg  1,000 mg Intravenous Q6H Raechel Chute, MD       acetaminophen (TYLENOL) tablet 325-650 mg  325-650 mg Oral Q6H PRN Signa Kell, MD       allopurinol (ZYLOPRIM) tablet 100 mg  100 mg Oral Daily Signa Kell, MD       bisacodyl (DULCOLAX) suppository 10 mg  10 mg Rectal Daily PRN Signa Kell, MD       Chlorhexidine Gluconate Cloth 2 % PADS 6 each  6 each Topical Daily Raechel Chute, MD   6 each at 01/21/23 1800   cyanocobalamin (VITAMIN B12) tablet 1,000 mcg  1,000 mcg Oral Daily Signa Kell, MD       docusate sodium (COLACE) capsule 100 mg  100 mg Oral BID Signa Kell, MD       famotidine (PEPCID) IVPB 20 mg premix  20 mg Intravenous Q24H Ezequiel Essex, NP   Stopped at 01/21/23 2019   folic acid injection 1 mg  1 mg Intravenous Daily Ezequiel Essex, NP       gabapentin (NEURONTIN) capsule 100 mg  100 mg Oral BID Signa Kell, MD       heparin ADULT infusion 100 units/mL (25000 units/240mL)  1,200 Units/hr Intravenous Continuous Lowella Bandy, RPH       hydrocortisone sodium succinate (SOLU-CORTEF) 100 MG injection 100 mg  100 mg Intravenous Q12H Hallaji, Sheema M, RPH   100 mg at 01/22/23 1610   insulin aspart (novoLOG) injection 0-15 Units  0-15 Units Subcutaneous Q4H Ezequiel Essex, NP   3 Units at 01/22/23  0827   levothyroxine (SYNTHROID) tablet 37.5 mcg  37.5 mcg Oral QAC breakfast Signa Kell, MD       methocarbamol (ROBAXIN) tablet 500 mg  500 mg Oral Q6H PRN Signa Kell, MD       Or   methocarbamol (ROBAXIN) 500 mg in dextrose 5 % 50 mL IVPB  500 mg Intravenous Q6H PRN Signa Kell, MD   Stopped at 01/21/23 2137   metoCLOPramide (REGLAN) tablet 5-10 mg  5-10 mg Oral Q8H PRN Signa Kell, MD       Or   metoCLOPramide (REGLAN) injection 5-10 mg  5-10 mg Intravenous Q8H PRN Signa Kell, MD       midodrine (PROAMATINE) tablet 5 mg  5 mg Oral TID WC Signa Kell, MD   5 mg at 01/21/23 0753   norepinephrine (LEVOPHED) 16 mg in (0.064 mg/mL) premix infusion  0-40 mcg/min Intravenous Titrated Ezequiel Essex, NP 11.25 mL/hr at 01/22/23 1151 12 mcg/min at 01/22/23 1151   ondansetron (ZOFRAN) tablet 4 mg  4 mg Oral Q6H PRN Signa Kell, MD       Or   ondansetron [facility]) injection 4 mg  4 mg Intravenous Q6H PRN Signa Kell, MD       oxybutynin (DITROPAN) tablet 5 mg  5 mg Oral TID Signa Kell, MD       oxyCODONE (Oxy IR/ROXICODONE) immediate release tablet 2.5-5 mg  2.5-5 mg Oral Q4H PRN Signa Kell, MD       oxyCODONE (Oxy IR/ROXICODONE) immediate release tablet 5-10 mg  5-10  mg Oral Q4H PRN Signa Kell, MD       piperacillin-tazobactam (ZOSYN) IVPB 2.25 g  2.25 g Intravenous Q8H Lowella Bandy, [facility]   Stopped at 01/22/23 4540   polyvinyl alcohol (LIQUIFILM TEARS) 1.4 % ophthalmic solution 1 drop  1 drop Both Eyes BID PRN Signa Kell, MD       remdesivir 200 mg in sodium chloride 0.9% 250 mL IVPB  200 mg Intravenous Once Raechel Chute, MD       Followed by   Melene Muller ON 01/23/2023] remdesivir 100 mg in sodium chloride 0.9 % 100 mL IVPB  100 mg Intravenous Daily Dgayli, Lianne Bushy, MD       rOPINIRole (REQUIP) tablet 0.25 mg  0.25 mg Oral QHS Signa Kell, MD       senna-docusate (Senokot-S) tablet 1 tablet  1 tablet Oral QHS PRN Signa Kell, MD       sertraline (ZOLOFT) tablet 25 mg  25 mg  Oral Daily Signa Kell, MD       sertraline (ZOLOFT) tablet 50 mg  50 mg Oral Daily Signa Kell, MD       sodium phosphate (FLEET) 7-19 GM/118ML enema 1 enema  1 enema Rectal Once PRN Signa Kell, MD       thiamine (VITAMIN B1) tablet 100 mg  100 mg Oral Daily Ezequiel Essex, NP       traZODone (DESYREL) tablet 25 mg  25 mg Oral BID PRN Signa Kell, MD       vasopressin (PITRESSIN) 20 Units in sodium chloride 0.9 % 100 mL infusion-*FOR SHOCK*  0-0.03 Units/min Intravenous Continuous Ezequiel Essex, NP 9 mL/hr at 01/22/23 1151 0.03 Units/min at 01/22/23 1151      Allergies: Allergies  Allergen Reactions   Cephalosporins Other (See Comments), Diarrhea and Nausea Only    Reaction:  Unknown  Other reaction(s): Other (see comments) Other reaction(s): Other (See Comments) Other Reaction: Intolerance  Reaction:  Stomach pain  Other Reaction: Intolerance Reaction:  Unknown    Fish Allergy Shortness Of Breath    All kinds of fish. Severe vomitting even with smell of fish.    Macrolides And Ketolides Other (See Comments)    Severe stomach pain (mycins)   Meperidine Shortness Of Breath, Nausea Only and Other (See Comments)    Stomach pain    Other Nausea Only, Swelling, Rash, Anaphylaxis, Diarrhea, Shortness Of Breath and Other (See Comments)    Allergen: NON-STEROIDS, bextra - hands and feet swell  Reaction:  Stomach pain   Other Reaction: Intolerance   Prednisone Anaphylaxis and Other (See Comments)    (facial swelling/redness/burning)Increases pts blood sugar  Pt states that she is allergic to all steroids.     Shellfish Allergy Anaphylaxis, Shortness Of Breath, Diarrhea, Nausea Only and Rash    Stomach pain    Sulfa Antibiotics Shortness Of Breath, Diarrhea, Nausea Only and Other (See Comments)    Reaction:  Stomach pain   Reaction:  Stomach pain  Other Reaction: Intolerance   Sulfacetamide Sodium Diarrhea, Nausea Only, Other (See Comments) and Shortness Of Breath    Reaction:   Stomach pain    Telbivudine Other (See Comments)    Unknown reaction   Uloric [Febuxostat] Anaphylaxis    Locks pt's body up    Aspirin Rash and Other (See Comments)    Stomach pain (tolerates lower doses)   Celecoxib Other (See Comments)    GI bleed, weakness, and stomach pain.     Cephalexin Diarrhea, Nausea Only  and Other (See Comments)    stomach pain    Codeine Diarrhea, Nausea Only and Other (See Comments)    Reaction:  Stomach pain Pt tolerates morphine    Dilaudid [Hydromorphone Hcl] Other (See Comments)    Reactions: easy to overdose - blood pressure drops really low   Erythromycin Diarrhea, Nausea Only and Other (See Comments)    Reaction:  Stomach pain    Iodinated Contrast Media Rash and Other (See Comments)    Pt states that she is unable to have because she has chronic kidney disease.   CKD Pt states that she is unable to have because she has chronic kidney disease.   CKD    Lipitor [Atorvastatin Calcium] Nausea Only    Reaction: nausea, weakness, pass blood   Oxycodone Other (See Comments)    Reaction:  Stomach pain    Aleve [Naproxen]     Reaction: severe stomach pain   Atorvastatin Nausea Only and Other (See Comments)    Weakness    Doxycycline     Stomach pain    Iodine Rash   Motrin [Ibuprofen]     Reaction: severe stomach pain   Tape Other (See Comments)    Causes pts skin to tear  Pt states that she is able to use paper tape.       Valdecoxib Swelling and Other (See Comments)    Pt states that her hands and feet swell.        Past Medical History: Past Medical History:  Diagnosis Date   Acid reflux    Acute respiratory failure (HCC) 04/09/2018   Anemia    Anxiety    Bowel obstruction (HCC)    Carotid artery disease (HCC)    Chronic heart failure with preserved ejection fraction (HFpEF) (HCC)    a. 03/2018 Echo: EF 60-65%, mildly dil LA/RA, mod dil RV, mod TR, PASP ; b. 01/2023 Echo: EF 60-65%, no rwma, low-nl RV fxn, RVSP 51.67mmHg.  Mild MR. Mod-sev TR.   CKD (chronic kidney disease), stage IV (HCC)    stage IV   Closed fracture of one rib of right side 05/29/2020   Complete tear of left rotator cuff 10/16/2014   Coronary artery disease (non-obstructive)    a. 08/2016 Cath: LM 25, LAD 20p, LCX 15p, RCA nl, EF 55%, 2+ MR; 10/2017 MV: No isch/infarct, EF 55-65%.   Depression    Diabetes mellitus without complication (HCC)    Diabetic neuropathy (HCC)    Diarrhea 03/04/2022   Fatty liver    Gastritis without bleeding    GI bleed    Gout    Hypertension    Hypothyroidism    Low back pain    history of kyphoplasty t12-l1   Morbid obesity (HCC)    Multiple gastric polyps    history of gi bleed.  polyps removed   Osteoarthritis    Osteoporosis    PAF (paroxysmal atrial fibrillation) (HCC) 2016   a. On amio/eliquis (CHA2DS2VASc = 9).   Paroxysmal atrial fibrillation (HCC)    Presence of permanent cardiac pacemaker    Pulmonary hypertension (HCC)    a. 03/2018 Echo: PASP .   Sinus node dysfunction (HCC) 08/24/2017   Sinus node dysfunction (HCC)    a. MDT- upgraded to dual chamber device Dec 2018- lead revision post upgrade dec 2018 (MDT [id number] Azure XT DR MRI).   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 plate & screws    BACK SURGERY  2019   patient describes kyphoplasty for compression fractures, MD referral said fusion   BREAST BIOPSY Right 08/16   stereo fibroadenomatous change, neg for atypia   BREAST EXCISIONAL BIOPSY Left    neg   CARDIAC CATHETERIZATION Left 08/25/2016   Procedure: Left Heart Cath and  Coronary Angiography;  Surgeon: Lamar Blinks, MD;  Location: [facility] INVASIVE CV LAB;  Service: Cardiovascular;  Laterality: Left;   CARDIOVERSION N/A 06/28/2017   Procedure: CARDIOVERSION;  Surgeon: Lamar Blinks, MD;  Location: [facility] ORS;  Service: Cardiovascular;  Laterality: N/A;   CARDIOVERSION N/A 02/06/2018   Procedure: CARDIOVERSION;  Surgeon: Wendall Stade, MD;  Location: [facility] ENDOSCOPY;  Service: Cardiovascular;  Laterality: N/A;   CARDIOVERSION N/A 07/15/2020   Procedure: CARDIOVERSION;  Surgeon: Jake Bathe, MD;  Location: [facility] ENDOSCOPY;  Service: Cardiovascular;  Laterality: N/A;   CATARACT EXTRACTION, BILATERAL  2012   CHOLECYSTECTOMY     colon blockage  1999   COLON SURGERY  2000   removed 20% of colon. colon had collapsed   COLONOSCOPY  2016   polyps removed 2016   DORSAL COMPARTMENT RELEASE Left 09/14/2016   Procedure: RELEASE DORSAL COMPARTMENT (DEQUERVAIN);  Surgeon: Donato Heinz, MD;  Location: [facility] ORS;  Service: Orthopedics;  Laterality: Left;   ESOPHAGOGASTRODUODENOSCOPY N/A 01/27/2015   Procedure: ESOPHAGOGASTRODUODENOSCOPY (EGD);  Surgeon: Christena Deem, MD;  Location: [facility] ENDOSCOPY;  Service: Endoscopy;  Laterality: N/A;   ESOPHAGOGASTRODUODENOSCOPY (EGD) WITH PROPOFOL N/A 07/24/2015   Procedure: ESOPHAGOGASTRODUODENOSCOPY (EGD) WITH PROPOFOL;  Surgeon: Midge Minium, MD;  Location: [facility] ENDOSCOPY;  Service: Endoscopy;  Laterality: N/A;   ESOPHAGOGASTRODUODENOSCOPY (EGD) WITH PROPOFOL N/A 04/12/2018   Procedure: ESOPHAGOGASTRODUODENOSCOPY (EGD) WITH PROPOFOL;  Surgeon: Wyline Mood, MD;  Location: [facility] ENDOSCOPY;  Service: Gastroenterology;  Laterality: N/A;   ESOPHAGOGASTRODUODENOSCOPY (EGD) WITH PROPOFOL N/A 06/22/2018   Procedure: ESOPHAGOGASTRODUODENOSCOPY (EGD) WITH PROPOFOL;  Surgeon: Rachael Fee, MD;  Location: [facility] ENDOSCOPY;  Service: Endoscopy;  Laterality: N/A;   ESOPHAGOGASTRODUODENOSCOPY (EGD) WITH PROPOFOL N/A 07/09/2018   Procedure:  ESOPHAGOGASTRODUODENOSCOPY (EGD) WITH PROPOFOL with resection of gastric polyps;  Surgeon: Wyline Mood, MD;  Location: [facility] ENDOSCOPY;  Service: Gastroenterology;  Laterality: N/A;   ESOPHAGOGASTRODUODENOSCOPY (EGD) WITH PROPOFOL N/A 05/17/2019   Procedure: ESOPHAGOGASTRODUODENOSCOPY (EGD) WITH PROPOFOL;  Surgeon: Wyline Mood, MD;  Location: [facility] ENDOSCOPY;  Service: Gastroenterology;  Laterality: N/A;   EUS N/A 06/22/2018   Procedure: UPPER ENDOSCOPIC ULTRASOUND (EUS) RADIAL;  Surgeon: Rachael Fee, MD;  Location: [facility] ENDOSCOPY;  Service: Endoscopy;  Laterality: N/A;   EYE SURGERY Bilateral    cataract extractions   HUMERUS IM NAIL Left 06/02/2022   Procedure: INTRAMEDULLARY (IM) NAIL HUMERAL;  Surgeon: Christena Flake, MD;  Location: [facility] ORS;  Service: Orthopedics;  Laterality: Left;   JOINT REPLACEMENT Bilateral 2014   Bilateral Knee replacement   LEAD REVISION/REPAIR N/A 08/29/2017   Procedure: LEAD REVISION/REPAIR;  Surgeon: Regan Lemming, MD;  Location: [facility] INVASIVE CV LAB;  Service: Cardiovascular;  Laterality: N/A;   OOPHORECTOMY     PACEMAKER INSERTION Left 07/01/2015   Procedure: INSERTION PACEMAKER;  Surgeon: Marcina Millard, MD;  Location: [facility] ORS;  Service: Cardiovascular;  Laterality: Left;   PACEMAKER REVISION N/A 08/28/2017   Procedure: PACEMAKER REVISION;  Surgeon: Duke Salvia, MD;  Location: [facility] INVASIVE CV LAB;  Service: Cardiovascular;  Laterality: N/A;   REPLACEMENT TOTAL KNEE BILATERAL     SHOULDER ARTHROSCOPY WITH ROTATOR CUFF REPAIR AND OPEN BICEPS TENODESIS Left 11/21/2019   Procedure: LEFT SHOULDER ARTHROSCOPY WITH DEBRIDEMENT, DECOMPRESSION, ROTATOR CUFF REPAIR AND BICEPS TENOLYSIS;  Surgeon: Christena Flake, MD;  Location: [facility] ORS;  Service: Orthopedics;  Laterality: Left;   SHOULDER ARTHROSCOPY WITH SUBACROMIAL DECOMPRESSION Left 2013   TEE WITHOUT CARDIOVERSION N/A 06/28/2017   Procedure: TRANSESOPHAGEAL ECHOCARDIOGRAM (TEE);  Surgeon: Lamar Blinks, MD;  Location: [facility] ORS;  Service: Cardiovascular;  Laterality: N/A;     Family History: Family History  Problem Relation Age of Onset   Diabetes Mellitus II Mother    CAD Mother    Heart attack Mother    Cancer Father        skin   Diabetes Mellitus II Brother    Stroke Brother    Breast cancer Neg Hx      Social History: Social History   Socioeconomic History   Marital status: Married    Spouse name: [name]   Number of children: Not on file   Years of education: Not on file   Highest education level: Not on file  Occupational History   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    Comment: retired  Tobacco Use   Smoking status: Never   Smokeless tobacco: Never  Vaping Use   Vaping Use: Never used  Substance and Sexual Activity   Alcohol use: No   Drug use: No   Sexual activity: Yes  Other Topics Concern   Not on file  Social History Narrative   DIABETIC   PACEMAKER   Social Determinants of Health   Financial Resource Strain: Not on file  Food Insecurity: No Food Insecurity (01/20/2023)   Hunger Vital Sign    Worried About Running Out of Food in the Last Year: Never true    Ran Out of Food in the Last Year: Never true  Transportation Needs: No Transportation Needs (01/20/2023)   PRAPARE - Administrator, Civil Service (Medical): No    Lack of Transportation (Non-Medical): No  Physical Activity: Not on file  Stress: Not on file  Social Connections: Not on file  Intimate Partner Violence: Not At Risk (01/20/2023)   Humiliation, Afraid, Rape, and Kick questionnaire    Fear of Current or Ex-Partner: No    Emotionally Abused: No    Physically Abused: No    Sexually Abused: No     Review of Systems: As per HPI  Vital Signs: Blood pressure (!) 102/58, pulse 68, temperature 99 F (37.2 C), temperature source Oral, resp. rate (!) 21, height 5\' 3"  (1.6 m), weight 70.1 kg, SpO2 100 %.  Weight trends: Filed Weights   01/20/23 1310 01/21/23 2100   Weight: 72 kg 70.1 kg    Physical Exam: Physical Exam: General:  No acute distress  Head:  Normocephalic, atraumatic. Moist oral mucosal membranes  Eyes:  Anicteric  Neck:  Supple  Lungs:   Clear to auscultation, normal effort  Heart:  S1S2 no rubs  Abdomen:   Soft, nontender, bowel sounds present  Extremities:  peripheral edema.  Neurologic:  Awake, alert, following commands  Skin:  No lesions  Access:     Lab results:  Basic Metabolic Panel:  Recent Labs  Lab 01/20/23 1022 01/21/23 0420 01/21/23 1239 01/22/23 0246  NA 136 139 135 138  K 4.7 5.0 4.5 4.3  CL 102 107 108 107  CO2 21* 25 19* 23  GLUCOSE 173* 274* 284* 239*  BUN 42* 44* 48* 51*  CREATININE 1.83* 2.36* 2.48* 2.68*  CALCIUM 9.1 8.3* 7.5* 8.0*  MG  --   --  1.7 2.2  PHOS  --   --  5.6* 6.3*    Creatinine  Date/Time Value Ref Range Status  10/16/2014 11:35 AM 1.35 (H) 0.60 - 1.30 mg/dL Final  16/06/9603 54:09 PM 1.50 (H) 0.60 - 1.30 mg/dL Final  81/19/1478 29:56 PM 1.24 0.60 - 1.30 mg/dL Final  21/30/8657 84:69 AM 1.48 (H) 0.60 - 1.30 mg/dL Final  62/95/2841 32:44 AM 1.48 (H) 0.60 - 1.30 mg/dL Final  09/14/7251 66:44 AM 1.26 0.60 - 1.30 mg/dL Final  03/47/4259 56:38 AM 1.50 (H) 0.60 - 1.30 mg/dL Final  75/64/3329 51:88 AM 1.37 (H) 0.60 - 1.30 mg/dL Final  41/66/0630 16:01 AM 1.26 0.60 - 1.30 mg/dL Final  09/32/3557 32:20 PM 1.19 0.60 - 1.30 mg/dL Final  25/42/7062 37:62 AM 1.00 0.60 - 1.30 mg/dL Final  83/15/1761 60:73 AM 1.33 (H) 0.60 - 1.30 mg/dL Final  71/02/2693 85:46 AM 1.21 0.60 - 1.30 mg/dL Final  27/11/5007 38:18 PM 1.24 0.60 - 1.30 mg/dL Final   Creatinine, Ser  Date/Time Value Ref Range Status  01/22/2023 02:46 AM 2.68 (H) 0.44 - 1.00 mg/dL Final  29/93/7169 67:89 PM 2.48 (H) 0.44 - 1.00 mg/dL Final  38/06/1750 02:58 AM 2.36 (H) 0.44 - 1.00 mg/dL Final  52/77/8242 35:36 AM 1.83 (H) 0.44 - 1.00 mg/dL Final  14/43/1540 08:67 AM 2.12 (H) 0.44 - 1.00 mg/dL Final  61/95/0932 67:12 AM 0.95  0.44 - 1.00 mg/dL Final  45/80/9983 38:25 AM 1.42 (H) 0.44 - 1.00 mg/dL Final  05/39/7673 41:93 AM 2.23 (H) 0.44 - 1.00 mg/dL Final  79/10/4095 35:32 AM 1.83 (H) 0.44 - 1.00 mg/dL Final  99/24/2683 41:96 PM 1.89 (H) 0.44 - 1.00 mg/dL Final  22/29/7989 21:19 AM 2.04 (H) 0.44 - 1.00 mg/dL Final  41/74/0814 48:18 AM 1.81 (H) 0.44 - 1.00 mg/dL Final  56/31/4970 26:37 AM 1.60 (H) 0.44 - 1.00 mg/dL Final  85/88/5027 74:12 AM 1.64 (H) 0.44 - 1.00 mg/dL Final  87/86/7672 09:47 PM 1.72 (H) 0.44 - 1.00 mg/dL Final  09/62/8366 29:47 AM 1.40 (H) 0.44 - 1.00 mg/dL Final  65/46/5035 46:56 AM 1.44 (H) 0.44 - 1.00 mg/dL Final  81/27/5170 01:74 PM 1.58 (H) 0.44 - 1.00 mg/dL Final  94/49/6759 16:38 AM 1.90 (H) 0.44 - 1.00 mg/dL Final  46/65/9935 70:17 AM 1.66 (H) 0.44 - 1.00 mg/dL Final  79/39/0300 92:33 PM 1.94 (H) 0.44 - 1.00 mg/dL Final  00/76/2263 33:54 AM 2.12 (H) 0.44 - 1.00 mg/dL Final  56/25/6389 37:34 AM 1.15 (H) 0.44 - 1.00 mg/dL Final  28/76/8115 72:62 AM 1.27 (H) 0.44 - 1.00 mg/dL Final  03/55/9741 63:84 AM 1.86 (H) 0.44 - 1.00 mg/dL Final  53/64/6803 21:22 PM 1.91 (H) 0.44 - 1.00 mg/dL Final  48/25/0037 04:88 PM 1.53 (H) 0.44 - 1.00 mg/dL Final  89/16/9450 38:88 AM 1.54 (H) 0.44 - 1.00 mg/dL Final  28/00/3491 79:15 AM 1.55 (H) 0.44 - 1.00 mg/dL Final  05/69/7948 01:65 AM 1.65 (H) 0.44 - 1.00 mg/dL Final  53/74/8270 78:67 PM 1.64 (H) 0.44 - 1.00 mg/dL Final  54/49/2010 07:12 AM 1.59 (H) 0.44 - 1.00 mg/dL Final  04/19/2018 06:13 AM 1.60 (H) 0.44 - 1.00 mg/dL Final  09/81/1914 78:29 AM 1.59 (H) 0.44 - 1.00 mg/dL Final  56/21/3086 57:84 AM 1.67 (H) 0.44 - 1.00 mg/dL Final  69/62/9528 41:32 AM 1.63 (H) 0.44 - 1.00 mg/dL Final  44/09/270 53:66 AM 1.51 (H) 0.44 - 1.00 mg/dL Final  44/11/4740 59:56 AM 1.49 (H) 0.44 - 1.00 mg/dL Final  38/75/6433 29:51 AM 1.47 (H) 0.44 - 1.00 mg/dL Final  88/41/6606 30:16 AM 1.62 (H) 0.44 - 1.00 mg/dL Final  09/20/3233 57:32 AM 1.54 (H) 0.44 - 1.00 mg/dL Final   20/25/4270 62:37 AM 1.80 (H) 0.44 - 1.00 mg/dL Final  62/83/1517 61:60 AM 1.96 (H) 0.44 - 1.00 mg/dL Final  73/71/0626 94:85 AM 1.86 (H) 0.44 - 1.00 mg/dL Final  46/27/0350 09:38 AM 2.09 (H) 0.44 - 1.00 mg/dL Final  18/29/9371 69:67 AM 2.10 (H) 0.44 - 1.00 mg/dL Final  89/38/1017 51:02 PM 2.20 (H) 0.44 - 1.00 mg/dL Final  58/52/7782 42:35 PM 2.62 (H) 0.44 - 1.00 mg/dL Final  36/14/4315 40:08 PM 2.71 (H) 0.44 - 1.00 mg/dL Final  67/61/9509 32:67 AM 2.82 (H) 0.44 - 1.00 mg/dL Final  12/45/8099 83:38 AM 2.90 (H) 0.44 - 1.00 mg/dL Final  25/01/3975 73:41 PM 1.53 (H) 0.44 - 1.00 mg/dL Final    CBC: Recent Labs  Lab 01/20/23 1022 01/21/23 0420 01/21/23 1239 01/21/23 1936 01/22/23 0246 01/22/23 0845  WBC 5.6 14.2* 14.4*  --  16.1*  --   NEUTROABS 3.5  --  13.4*  --  13.0*  --   HGB 13.0 11.6* 10.3* 9.5* 9.3* 8.9*  HCT 39.2 37.3 32.5* 29.6* 29.8* 28.1*  MCV 97.8 103.0* 101.6*  --  103.8*  --   PLT 160 152 172  --  143*  --     Microbiology: Results for orders placed or performed during the hospital encounter of 01/20/23  MRSA Next Gen by PCR, Nasal     Status: None   Collection Time: 01/20/23 11:00 PM   Specimen: Nasal Mucosa; Nasal Swab  Result Value Ref Range Status   MRSA by PCR Next Gen NOT DETECTED NOT DETECTED Final    Comment: (NOTE) The GeneXpert MRSA Assay (FDA approved for NASAL specimens only), is one component of a comprehensive MRSA colonization surveillance program. It is not intended to diagnose MRSA infection nor to guide or monitor treatment for MRSA infections. Test performance is not FDA approved in patients less than 39 years old. Performed at [facility], [address] [place], [place] [address]   Culture, blood (Routine X 2) w Reflex to ID Panel     Status: None (Preliminary result)   Collection Time: 01/21/23  1:37 PM   Specimen: BLOOD  Result Value Ref Range Status   Specimen Description BLOOD BLOOD RIGHT HAND  Final   Special Requests    Final    BOTTLES DRAWN AEROBIC AND ANAEROBIC Blood Culture results may not be optimal due to an inadequate volume of blood received in culture bottles   Culture   Final    NO GROWTH < 24 HOURS Performed at [facility], [address], [place], [place] [address]    Report Status PENDING  Incomplete  Culture, blood (Routine X 2) w Reflex to ID Panel     Status: None (Preliminary result)   Collection Time: 01/21/23  3:03 PM   Specimen: BLOOD RIGHT HAND  Result Value Ref Range Status   Specimen Description BLOOD RIGHT HAND  Final  Special Requests   Final    BOTTLES DRAWN AEROBIC ONLY Blood Culture results may not be optimal due to an inadequate volume of blood received in culture bottles   Culture   Final    NO GROWTH < 24 HOURS Performed at [facility], [address] [place], [place] [address]    Report Status PENDING  Incomplete  MRSA Next Gen by PCR, Nasal     Status: None   Collection Time: 01/21/23  6:30 PM   Specimen: Nasal Mucosa; Nasal Swab  Result Value Ref Range Status   MRSA by PCR Next Gen NOT DETECTED NOT DETECTED Final    Comment: (NOTE) The GeneXpert MRSA Assay (FDA approved for NASAL specimens only), is one component of a comprehensive MRSA colonization surveillance program. It is not intended to diagnose MRSA infection nor to guide or monitor treatment for MRSA infections. Test performance is not FDA approved in patients less than 83 years old. Performed at [facility], [address] [place], [place] [address]   SARS Coronavirus 2 by RT PCR (hospital order, performed in [facility] hospital lab) *cepheid single result test* Anterior Nasal Swab     Status: Abnormal   Collection Time: 01/22/23  8:13 AM   Specimen: Anterior Nasal Swab  Result Value Ref Range Status   SARS Coronavirus 2 by RT PCR POSITIVE (A) NEGATIVE Final    Comment: (NOTE) SARS-CoV-2 target nucleic acids are DETECTED  SARS-CoV-2 RNA is generally detectable  in upper respiratory specimens  during the acute phase of infection.  Positive results are indicative  of the presence of the identified virus, but do not rule out bacterial infection or co-infection with other pathogens not detected by the test.  Clinical correlation with patient history and  other diagnostic information is necessary to determine patient infection status.  The expected result is negative.  Fact Sheet for Patients:   RoadLapTop.co.za   Fact Sheet for Healthcare Providers:   http://kim-miller.com/    This test is not yet approved or cleared by the [place] FDA and  has been authorized for detection and/or diagnosis of SARS-CoV-2 by FDA under an Emergency Use Authorization (EUA).  This EUA will remain in effect (meaning this test can be used) for the duration of  the COVID-19 declaration under Section 564(b)(1)  of the Act, 21 U.S.C. section 360-bbb-3(b)(1), unless the authorization is terminated or revoked sooner.   Performed at [facility], [address] [place], [place] [address]     Urinalysis: Recent Labs    01/22/23 0246  COLORURINE YELLOW  LABSPEC >1.030*  PHURINE 5.5  GLUCOSEU NEGATIVE  HGBUR TRACE*  BILIRUBINUR SMALL*  KETONESUR TRACE*  PROTEINUR 30*  NITRITE NEGATIVE  LEUKOCYTESUR NEGATIVE     Imaging:  ECHOCARDIOGRAM COMPLETE  Result Date: 01/22/2023    ECHOCARDIOGRAM REPORT   Patient Name:   ANHELICA GRABIEC Date of Exam: 01/22/2023 Medical Rec #:  [id number]         Height:       63.0 in Accession #:    [id number]        Weight:       154.5 lb Date of Birth:  05-02-1941        BSA:          1.733 m Patient Age:    81 years          BP:           121/42 mmHg Patient Gender: F  HR:           61 bpm. Exam Location:  [facility] Procedure: 2D Echo, Cardiac Doppler and Color Doppler Indications:     Pulmonary hypertension I27.2  History:         Patient has prior history of  Echocardiogram examinations, most                  recent 03/14/2018. Pacemaker, Arrythmias:LBBB; Risk                  Factors:Hypertension and Diabetes.  Sonographer:     Cristela Blue Referring Phys:  [id number] Ezequiel Essex Diagnosing Phys: Julien Nordmann MD IMPRESSIONS  1. Left ventricular ejection fraction, by estimation, is 60 to 65%. Left ventricular ejection fraction by PLAX is 63 %. The left ventricle has normal function. The left ventricle has no regional wall motion abnormalities. Left ventricular diastolic parameters are indeterminate.  2. Right ventricular systolic function is low normal. The right ventricular size is mildly enlarged. There is moderately elevated pulmonary artery systolic pressure. The estimated right ventricular systolic pressure is 51.2 mmHg.  3. The mitral valve is normal in structure. Mild mitral valve regurgitation. No evidence of mitral stenosis.  4. Tricuspid valve regurgitation is moderate to severe.  5. The aortic valve is tricuspid. Aortic valve regurgitation is not visualized. No aortic stenosis is present.  6. The inferior vena cava is normal in size with greater than 50% respiratory variability, suggesting right atrial pressure of 3 mmHg. FINDINGS  Left Ventricle: Left ventricular ejection fraction, by estimation, is 60 to 65%. Left ventricular ejection fraction by PLAX is 63 %. The left ventricle has normal function. The left ventricle has no regional wall motion abnormalities. The left ventricular internal cavity size was normal in size. There is no left ventricular hypertrophy. Left ventricular diastolic parameters are indeterminate. Right Ventricle: The right ventricular size is mildly enlarged. No increase in right ventricular wall thickness. Right ventricular systolic function is low normal. There is moderately elevated pulmonary artery systolic pressure. The tricuspid regurgitant  velocity is 3.40 m/s, and with an assumed right atrial pressure of 5 mmHg, the estimated  right ventricular systolic pressure is 51.2 mmHg. Left Atrium: Left atrial size was normal in size. Right Atrium: Right atrial size was normal in size. Pericardium: There is no evidence of pericardial effusion. Mitral Valve: The mitral valve is normal in structure. There is mild calcification of the mitral valve leaflet(s). Mild mitral valve regurgitation. No evidence of mitral valve stenosis. MV peak gradient, 8.9 mmHg. The mean mitral valve gradient is 4.0 mmHg. Tricuspid Valve: The tricuspid valve is normal in structure. Tricuspid valve regurgitation is moderate to severe. No evidence of tricuspid stenosis. Aortic Valve: The aortic valve is tricuspid. Aortic valve regurgitation is not visualized. No aortic stenosis is present. Aortic valve mean gradient measures 3.0 mmHg. Aortic valve peak gradient measures 5.7 mmHg. Aortic valve area, by VTI measures 2.89 cm. Pulmonic Valve: The pulmonic valve was normal in structure. Pulmonic valve regurgitation is not visualized. No evidence of pulmonic stenosis. Aorta: The aortic root is normal in size and structure. Venous: The inferior vena cava is normal in size with greater than 50% respiratory variability, suggesting right atrial pressure of 3 mmHg. IAS/Shunts: No atrial level shunt detected by color flow Doppler. Additional Comments: A device lead is visualized.  LEFT VENTRICLE PLAX 2D LV EF:         Left  Diastology                ventricular     LV e' medial:    18.90 cm/s                ejection        LV E/e' medial:  6.5                fraction by     LV e' lateral:   15.90 cm/s                PLAX is 63      LV E/e' lateral: 7.7                %. LVIDd:         4.70 cm LVIDs:         3.10 cm LV PW:         0.90 cm LV IVS:        0.90 cm LVOT diam:     2.00 cm LV SV:         55 LV SV Index:   32 LVOT Area:     3.14 cm  RIGHT VENTRICLE RV Basal diam:  3.80 cm RV Mid diam:    3.60 cm RV S prime:     19.80 cm/s TAPSE (M-mode): 2.6 cm LEFT ATRIUM            Index        RIGHT ATRIUM           Index LA diam:      4.20 cm 2.42 cm/m   RA Area:     24.30 cm LA Vol (A2C): 53.1 ml 30.64 ml/m  RA Volume:   82.80 ml  47.78 ml/m LA Vol (A4C): 53.1 ml 30.64 ml/m  AORTIC VALVE AV Area (Vmax):    2.28 cm AV Area (Vmean):   2.17 cm AV Area (VTI):     2.89 cm AV Vmax:           119.00 cm/s AV Vmean:          84.800 cm/s AV VTI:            0.190 m AV Peak Grad:      5.7 mmHg AV Mean Grad:      3.0 mmHg LVOT Vmax:         86.20 cm/s LVOT Vmean:        58.500 cm/s LVOT VTI:          0.175 m LVOT/AV VTI ratio: 0.92  AORTA Ao Root diam: 2.70 cm MITRAL VALVE                TRICUSPID VALVE MV Area (PHT): 3.10 cm     TR Peak grad:   46.2 mmHg MV Area VTI:   1.45 cm     TR Vmax:        340.00 cm/s MV Peak grad:  8.9 mmHg MV Mean grad:  4.0 mmHg     SHUNTS MV Vmax:       1.49 m/s     Systemic VTI:  0.18 m MV Vmean:      91.4 cm/s    Systemic Diam: 2.00 cm MV Decel Time: 245 msec MV E velocity: 122.00 cm/s MV A velocity: 88.00 cm/s MV E/A ratio:  1.39 Julien Nordmann MD Electronically signed by Julien Nordmann MD Signature Date/Time: 01/22/2023/9:16:37 AM    Final  US Abdomen Limited RUQ (LIVER/GB)  Result Date: 01/21/2023 CLINICAL DATA:  Elevated LFTs EXAM: ULTRASOUND ABDOMEN LIMITED RIGHT UPPER QUADRANT COMPARISON:  03/04/2022 FINDINGS: Gallbladder: Surgically removed Common bile duct: Diameter: 6.4 mm. Liver: Increased echogenicity without focal mass likely related to fatty infiltration. Portal vein is patent on color Doppler imaging with normal direction of blood flow towards the liver. Other: None. IMPRESSION: Fatty liver. Status post cholecystectomy. Electronically Signed   By: Alcide Clever M.D.   On: 01/21/2023 20:22   DG Chest Port 1 View  Result Date: 01/21/2023 CLINICAL DATA:  Check central line placement EXAM: PORTABLE CHEST 1 VIEW COMPARISON:  01/20/23 FINDINGS: Cardiac shadow is stable. Pacing device is again seen. New right jugular central line is seen with the  catheter tip in the distal superior vena cava. No pneumothorax is seen. The lungs are clear. Changes of prior vertebral augmentation are noted in the lower thoracic spine. IMPRESSION: No pneumothorax following central line placement. Electronically Signed   By: Alcide Clever M.D.   On: 01/21/2023 20:17   DG HIP UNILAT WITH PELVIS 2-3 VIEWS LEFT  Result Date: 01/21/2023 CLINICAL DATA:  Right hip fracture repair EXAM: DG HIP (WITH OR WITHOUT PELVIS) 2-3V LEFT COMPARISON:  None Available. FINDINGS: Seven fluoroscopic images were obtained during right hip fracture repair. By the end of the study, gamma nails and an intramedullary femoral rod extend across the fracture site with a distal interlocking screw. No other abnormalities. IMPRESSION: Right hip fracture repair as above. Electronically Signed   By: Gerome Sam III M.D.   On: 01/21/2023 12:15   DG C-Arm 1-60 Min-No Report  Result Date: 01/21/2023 Fluoroscopy was utilized by the requesting physician.  No radiographic interpretation.   DG C-Arm 1-60 Min-No Report  Result Date: 01/21/2023 Fluoroscopy was utilized by the requesting physician.  No radiographic interpretation.     Assessment & Plan:   Principal Problem:   Shock (HCC) Active Problems:   Permanent atrial fibrillation (HCC)   Gastro-esophageal reflux disease without esophagitis   Gout   Hyperlipemia, mixed   CAD (coronary artery disease)   UTI (urinary tract infection)   Hypothyroidism   Acute renal failure superimposed on stage 4 chronic kidney disease (HCC)   Chronic heart failure with preserved ejection fraction (HCC)   Transaminitis   Hypotension after procedure   Closed fracture of right hip (HCC)   Nausea   Pulmonary hypertension (HCC)   Metabolic acidosis  82 y.o. female with a PMHx of hypertension, atrial fibrillation, congestive heart failure, diabetes, peripheral vascular disease, chronic kidney disease, hyperlipidemia and anemia now admitted to the hospital  with history of fall sustaining hip fracture.  She had repair of the fracture.  Patient also is COVID-positive.  Renal evaluation was requested secondary to worsening renal insufficiency and decreased urine output.  #1: Acute kidney injury on chronic kidney disease: Patient is chronic kidney disease stage IV with a GFR of about 25 cc/min and a creatinine of 2.3.  Acute kidney injury is most likely secondary to acute tubular necrosis secondary to hypotension and septic shock.  Will continue the gentle hydration as needed.  No acute need for dialysis.  Urine output is between 400 to 600 cc per shift.  #2: Sepsis/hypotension: Continue the pressors and wean as tolerated.  Continue the antibiotics as ordered with Zosyn and cefazolin..  #3: Diabetes: Continue the insulin as ordered.  #4: Right hip fracture: S/p repair.  #5: Anemia: Patient may have anemia secondary to chronic kidney disease.  Will continue to monitor closely.  May require IV iron and also Epogen.  Case discussed with ICU team.  Will continue to follow closely.   LOS: 2 Lorain Childes, MD [facility]. 5/12/202412:55 PM

## 2023-01-22 NOTE — Progress Notes (Signed)
Remdesivir - Pharmacy Brief Note   O:  ALT: 40 CXR: clear lungs SpO2: 99-100% on 2L [place]   A/P:  Remdesivir 200 mg IVPB once followed by 100 mg IVPB daily x 4 days.   Adaliz Dobis Rodriguez-Guzman PharmD, BCPS 01/22/2023 12:11 PM

## 2023-01-22 NOTE — Progress Notes (Signed)
ANTICOAGULATION CONSULT NOTE  Pharmacy Consult for heparin infusion Indication: pulmonary embolus  Allergies  Allergen Reactions   Cephalosporins Other (See Comments), Diarrhea and Nausea Only    Reaction:  Unknown  Other reaction(s): Other (see comments) Other reaction(s): Other (See Comments) Other Reaction: Intolerance  Reaction:  Stomach pain  Other Reaction: Intolerance Reaction:  Unknown    Fish Allergy Shortness Of Breath    All kinds of fish. Severe vomitting even with smell of fish.    Macrolides And Ketolides Other (See Comments)    Severe stomach pain (mycins)   Meperidine Shortness Of Breath, Nausea Only and Other (See Comments)    Stomach pain    Other Nausea Only, Swelling, Rash, Anaphylaxis, Diarrhea, Shortness Of Breath and Other (See Comments)    Allergen: NON-STEROIDS, bextra - hands and feet swell  Reaction:  Stomach pain   Other Reaction: Intolerance   Prednisone Anaphylaxis and Other (See Comments)    (facial swelling/redness/burning)Increases pts blood sugar  Pt states that she is allergic to all steroids.     Shellfish Allergy Anaphylaxis, Shortness Of Breath, Diarrhea, Nausea Only and Rash    Stomach pain    Sulfa Antibiotics Shortness Of Breath, Diarrhea, Nausea Only and Other (See Comments)    Reaction:  Stomach pain   Reaction:  Stomach pain  Other Reaction: Intolerance   Sulfacetamide Sodium Diarrhea, Nausea Only, Other (See Comments) and Shortness Of Breath    Reaction:  Stomach pain    Telbivudine Other (See Comments)    Unknown reaction   Uloric [Febuxostat] Anaphylaxis    Locks pt's body up    Aspirin Rash and Other (See Comments)    Stomach pain (tolerates lower doses)   Celecoxib Other (See Comments)    GI bleed, weakness, and stomach pain.     Cephalexin Diarrhea, Nausea Only and Other (See Comments)    stomach pain    Codeine Diarrhea, Nausea Only and Other (See Comments)    Reaction:  Stomach pain Pt tolerates morphine     Dilaudid [Hydromorphone Hcl] Other (See Comments)    Reactions: easy to overdose - blood pressure drops really low   Erythromycin Diarrhea, Nausea Only and Other (See Comments)    Reaction:  Stomach pain    Iodinated Contrast Media Rash and Other (See Comments)    Pt states that she is unable to have because she has chronic kidney disease.   CKD Pt states that she is unable to have because she has chronic kidney disease.   CKD    Lipitor [Atorvastatin Calcium] Nausea Only    Reaction: nausea, weakness, pass blood   Oxycodone Other (See Comments)    Reaction:  Stomach pain    Aleve [Naproxen]     Reaction: severe stomach pain   Atorvastatin Nausea Only and Other (See Comments)    Weakness    Doxycycline     Stomach pain    Iodine Rash   Motrin [Ibuprofen]     Reaction: severe stomach pain   Tape Other (See Comments)    Causes pts skin to tear  Pt states that she is able to use paper tape.       Valdecoxib Swelling and Other (See Comments)    Pt states that her hands and feet swell.      Patient Measurements: Height: 5\' 3"  (160 cm) Weight: 70.1 kg (154 lb 8.7 oz) IBW/kg (Calculated) : 52.4 Heparin Dosing Weight: 66.9 kg  Vital Signs: Temp: 98.4 F (36.9 C) (05/12  2000) Temp Source: Oral (05/12 2000) BP: 102/65 (05/12 2030) Pulse Rate: 66 (05/12 2030)  Labs: Recent Labs    01/20/23 1045 01/21/23 0420 01/21/23 1239 01/21/23 1503 01/22/23 0246 01/22/23 0813 01/22/23 0845 01/22/23 1132 01/22/23 1400 01/22/23 1943  HGB  --  11.6* 10.3*   < > 9.3*  --  8.9*  --  8.4*  --   HCT  --  37.3 32.5*   < > 29.8*  --  28.1*  --  26.8*  --   PLT  --  152 172  --  143*  --   --   --   --   --   APTT 31  --   --   --   --   --   --   --   --  85*  LABPROT 15.1  --  16.4*  --   --   --   --   --   --   --   INR 1.2  --  1.3*  --   --   --   --   --   --   --   CREATININE  --  2.36* 2.48*  --  2.68*  --   --   --   --   --   CKTOTAL  --   --  268*  --   --   --   --   --    --   --   TROPONINIHS  --   --  45*   < >  --  101*  --  90* 88*  --    < > = values in this interval not displayed.     Estimated Creatinine Clearance: 15.2 mL/min (A) (by C-G formula based on SCr of 2.68 mg/dL (H)).   Medical History: Past Medical History:  Diagnosis Date   Acid reflux    Acute respiratory failure (HCC) 04/09/2018   Anemia    Anxiety    Bowel obstruction (HCC)    Carotid artery disease (HCC)    Chronic heart failure with preserved ejection fraction (HFpEF) (HCC)    a. 03/2018 Echo: EF 60-65%, mildly dil LA/RA, mod dil RV, mod TR, PASP ; b. 01/2023 Echo: EF 60-65%, no rwma, low-nl RV fxn, RVSP 51.21mmHg. Mild MR. Mod-sev TR.   CKD (chronic kidney disease), stage IV (HCC)    stage IV   Closed fracture of one rib of right side 05/29/2020   Complete tear of left rotator cuff 10/16/2014   Coronary artery disease (non-obstructive)    a. 08/2016 Cath: LM 25, LAD 20p, LCX 15p, RCA nl, EF 55%, 2+ MR; 10/2017 MV: No isch/infarct, EF 55-65%.   Depression    Diabetes mellitus without complication (HCC)    Diabetic neuropathy (HCC)    Diarrhea 03/04/2022   Fatty liver    Gastritis without bleeding    GI bleed    Gout    Hypertension    Hypothyroidism    Low back pain    history of kyphoplasty t12-l1   Morbid obesity (HCC)    Multiple gastric polyps    history of gi bleed.  polyps removed   Osteoarthritis    Osteoporosis    PAF (paroxysmal atrial fibrillation) (HCC) 2016   a. On amio/eliquis (CHA2DS2VASc = 9).   Paroxysmal atrial fibrillation (HCC)    Presence of permanent cardiac pacemaker    Pulmonary hypertension (HCC)    a. 03/2018 Echo: PASP .  Sinus node dysfunction (HCC) 08/24/2017   Sinus node dysfunction (HCC)    a. MDT- upgraded to dual chamber device Dec 2018- lead revision post upgrade dec 2018 (MDT [id number] Azure XT DR MRI).   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   Uncontrolled type 2 diabetes mellitus with hypoglycemia, with long-term current use of insulin (HCC) 03/04/2022    Medications:  Scheduled:   allopurinol  100 mg Oral Daily   Chlorhexidine Gluconate Cloth  6 each Topical Daily   cyanocobalamin  1,000 mcg Oral Daily   docusate sodium  100 mg Oral BID   folic acid  1 mg Intravenous Daily   gabapentin  100 mg Oral BID   hydrocortisone sod succinate (SOLU-CORTEF) inj  100 mg Intravenous Q12H   insulin aspart  0-15 Units Subcutaneous Q4H   levothyroxine  37.5 mcg Oral QAC breakfast   midodrine  5 mg Oral TID WC   oxybutynin  5 mg Oral TID   rOPINIRole  0.25 mg Oral QHS   sertraline  25 mg Oral Daily   sertraline  50 mg Oral Daily   thiamine  100 mg Oral Daily    Assessment: 82 yo female who presented to [facility] ER on 05/10 via EMS from [facility] with right hip pain after tripping and having a witness fall. She denied hitting her head or LOC. Pt does take eliquis for atrial fibrillation last dose 05/10 am per Med Rec.   Goal of Therapy:  Heparin level 0.3-0.5 units/ml aPTT 66 - 85s seconds Monitor platelets by anticoagulation protocol: Yes   05/12 1943 aPTT 85s, therapeutic x 1  Plan:  ---Continue heparin infusion at 1200 units/hr (avoid bolus doses based on MD order/request) ---Check aPTT level in 8 hours (we will use aPTT to guide therapy until anti-Xa and aPTT correlate) ---Continue to monitor H&H and platelets  Barrie Folk, PharmD 01/22/2023,8:46 PM

## 2023-01-22 NOTE — Progress Notes (Signed)
Pharmacy Antibiotic Note  Deborah Obrien is a 82 y.o. female w/ PMH of gout, afib, hypothyroidism, DM, hypotension, OAB, HLD, RLS, depression admitted on 01/20/2023 with sepsis.  Pharmacy has been consulted for Zosyn dosing.  Plan: start    Zosyn 3.375g IV q8h (4 hour infusion). ---follow renal function for needed dose adjustments  Height: 5\' 3"  (160 cm) Weight: 70.1 kg (154 lb 8.7 oz) IBW/kg (Calculated) : 52.4  Temp (24hrs), Avg:98.9 F (37.2 C), Min:97.8 F (36.6 C), Max:100 F (37.8 C)  Recent Labs  Lab 01/20/23 1022 01/21/23 0420 01/21/23 1239 01/21/23 1712 01/22/23 0246  WBC 5.6 14.2* 14.4*  --  16.1*  CREATININE 1.83* 2.36* 2.48*  --  2.68*  LATICACIDVEN  --   --  1.5 1.3  --     Estimated Creatinine Clearance: 15.2 mL/min (A) (by C-G formula based on SCr of 2.68 mg/dL (H)).    Allergies  Allergen Reactions   Cephalosporins Other (See Comments), Diarrhea and Nausea Only    Reaction:  Unknown  Other reaction(s): Other (see comments) Other reaction(s): Other (See Comments) Other Reaction: Intolerance  Reaction:  Stomach pain  Other Reaction: Intolerance Reaction:  Unknown    Fish Allergy Shortness Of Breath    All kinds of fish. Severe vomitting even with smell of fish.    Macrolides And Ketolides Other (See Comments)    Severe stomach pain (mycins)   Meperidine Shortness Of Breath, Nausea Only and Other (See Comments)    Stomach pain    Other Nausea Only, Swelling, Rash, Anaphylaxis, Diarrhea, Shortness Of Breath and Other (See Comments)    Allergen: NON-STEROIDS, bextra - hands and feet swell  Reaction:  Stomach pain   Other Reaction: Intolerance   Prednisone Anaphylaxis and Other (See Comments)    (facial swelling/redness/burning)Increases pts blood sugar  Pt states that she is allergic to all steroids.     Shellfish Allergy Anaphylaxis, Shortness Of Breath, Diarrhea, Nausea Only and Rash    Stomach pain    Sulfa Antibiotics Shortness Of Breath,  Diarrhea, Nausea Only and Other (See Comments)    Reaction:  Stomach pain   Reaction:  Stomach pain  Other Reaction: Intolerance   Sulfacetamide Sodium Diarrhea, Nausea Only, Other (See Comments) and Shortness Of Breath    Reaction:  Stomach pain    Telbivudine Other (See Comments)    Unknown reaction   Uloric [Febuxostat] Anaphylaxis    Locks pt's body up    Aspirin Rash and Other (See Comments)    Stomach pain (tolerates lower doses)   Celecoxib Other (See Comments)    GI bleed, weakness, and stomach pain.     Cephalexin Diarrhea, Nausea Only and Other (See Comments)    stomach pain    Codeine Diarrhea, Nausea Only and Other (See Comments)    Reaction:  Stomach pain Pt tolerates morphine    Dilaudid [Hydromorphone Hcl] Other (See Comments)    Reactions: easy to overdose - blood pressure drops really low   Erythromycin Diarrhea, Nausea Only and Other (See Comments)    Reaction:  Stomach pain    Iodinated Contrast Media Rash and Other (See Comments)    Pt states that she is unable to have because she has chronic kidney disease.   CKD Pt states that she is unable to have because she has chronic kidney disease.   CKD    Lipitor [Atorvastatin Calcium] Nausea Only    Reaction: nausea, weakness, pass blood   Oxycodone Other (See Comments)  Reaction:  Stomach pain    Aleve [Naproxen]     Reaction: severe stomach pain   Atorvastatin Nausea Only and Other (See Comments)    Weakness    Doxycycline     Stomach pain    Iodine Rash   Motrin [Ibuprofen]     Reaction: severe stomach pain   Tape Other (See Comments)    Causes pts skin to tear  Pt states that she is able to use paper tape.       Valdecoxib Swelling and Other (See Comments)    Pt states that her hands and feet swell.      Antimicrobials this admission: 05/12 Zosyn >>   Microbiology results: 05/11 BCx: pending 05/11 MRSA PCR: negative  Thank you for allowing pharmacy to be a part of this patient's  care.  Lowella Bandy 01/22/2023 7:18 AM

## 2023-01-22 NOTE — Consult Note (Signed)
Cardiology Consult    Patient ID: Deborah Obrien MRN: [id number], DOB/AGE: December 05, 1940   Admit date: 01/20/2023 Date of Consult: 01/22/2023  Primary Physician: Melonie Florida, FNP Primary Cardiologist: Sherryl Manges, MD Requesting Provider: Silverio Decamp, MD  Patient Profile    Deborah Obrien is a 82 y.o. female with a history of sinus node dysfxn s/p PPM, paroxysmal afib/flutter, hfpef, HTN, HL, nonobs CAD, pulmonary HTN, CKD IV, DMII, TIA, anemia, GERD, anxiety, depression, and hypothyroidism, who is being seen today for the evaluation of hypotension at the request of Dr. Aundria Rud.  Past Medical History   Past Medical History:  Diagnosis Date   Acid reflux    Acute respiratory failure (HCC) 04/09/2018   Anemia    Anxiety    Bowel obstruction (HCC)    Carotid artery disease (HCC)    Chronic heart failure with preserved ejection fraction (HFpEF) (HCC)    a. 03/2018 Echo: EF 60-65%, mildly dil LA/RA, mod dil RV, mod TR, PASP ; b. 01/2023 Echo: EF 60-65%, no rwma, low-nl RV fxn, RVSP 51.88mmHg. Mild MR. Mod-sev TR.   CKD (chronic kidney disease), stage IV (HCC)    stage IV   Closed fracture of one rib of right side 05/29/2020   Complete tear of left rotator cuff 10/16/2014   Coronary artery disease (non-obstructive)    a. 08/2016 Cath: LM 25, LAD 20p, LCX 15p, RCA nl, EF 55%, 2+ MR; 10/2017 MV: No isch/infarct, EF 55-65%.   Depression    Diabetes mellitus without complication (HCC)    Diabetic neuropathy (HCC)    Diarrhea 03/04/2022   Fatty liver    Gastritis without bleeding    GI bleed    Gout    Hypertension    Hypothyroidism    Low back pain    history of kyphoplasty t12-l1   Morbid obesity (HCC)    Multiple gastric polyps    history of gi bleed.  polyps removed   Osteoarthritis    Osteoporosis    PAF (paroxysmal atrial fibrillation) (HCC) 2016   a. On amio/eliquis (CHA2DS2VASc = 9).   Paroxysmal atrial fibrillation (HCC)    Presence of permanent cardiac  pacemaker    Pulmonary hypertension (HCC)    a. 03/2018 Echo: PASP .   Sinus node dysfunction (HCC) 08/24/2017   Sinus node dysfunction (HCC)    a. MDT- upgraded to dual chamber device Dec 2018- lead revision post upgrade dec 2018 (MDT [id number] Azure XT DR MRI).   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 plate & screws    BACK SURGERY  2019   patient describes kyphoplasty for compression fractures, MD referral said fusion   BREAST BIOPSY Right 08/16   stereo fibroadenomatous change, neg for atypia   BREAST EXCISIONAL BIOPSY Left    neg   CARDIAC CATHETERIZATION Left 08/25/2016   Procedure: Left Heart Cath and Coronary Angiography;  Surgeon: Lamar Blinks, MD;  Location: [facility] INVASIVE CV LAB;  Service: Cardiovascular;  Laterality: Left;   CARDIOVERSION N/A 06/28/2017   Procedure: CARDIOVERSION;  Surgeon: Lamar Blinks, MD;  Location: [facility] ORS;  Service: Cardiovascular;  Laterality: N/A;   CARDIOVERSION N/A 02/06/2018   Procedure: CARDIOVERSION;  Surgeon: Wendall Stade, MD;  Location: [facility] ENDOSCOPY;  Service: Cardiovascular;  Laterality: N/A;   CARDIOVERSION N/A 07/15/2020   Procedure: CARDIOVERSION;  Surgeon: Jake Bathe, MD;  Location: [facility] ENDOSCOPY;  Service: Cardiovascular;  Laterality: N/A;   CATARACT EXTRACTION, BILATERAL  2012   CHOLECYSTECTOMY     colon blockage  1999   COLON SURGERY  2000   removed 20% of colon. colon had collapsed   COLONOSCOPY  2016   polyps removed 2016   DORSAL COMPARTMENT RELEASE Left 09/14/2016   Procedure: RELEASE DORSAL COMPARTMENT (DEQUERVAIN);  Surgeon: Donato Heinz,  MD;  Location: [facility] ORS;  Service: Orthopedics;  Laterality: Left;   ESOPHAGOGASTRODUODENOSCOPY N/A 01/27/2015   Procedure: ESOPHAGOGASTRODUODENOSCOPY (EGD);  Surgeon: Christena Deem, MD;  Location: [facility] ENDOSCOPY;  Service: Endoscopy;  Laterality: N/A;   ESOPHAGOGASTRODUODENOSCOPY (EGD) WITH PROPOFOL N/A 07/24/2015   Procedure: ESOPHAGOGASTRODUODENOSCOPY (EGD) WITH PROPOFOL;  Surgeon: Midge Minium, MD;  Location: [facility] ENDOSCOPY;  Service: Endoscopy;  Laterality: N/A;   ESOPHAGOGASTRODUODENOSCOPY (EGD) WITH PROPOFOL N/A 04/12/2018   Procedure: ESOPHAGOGASTRODUODENOSCOPY (EGD) WITH PROPOFOL;  Surgeon: Wyline Mood, MD;  Location: [facility] ENDOSCOPY;  Service: Gastroenterology;  Laterality: N/A;   ESOPHAGOGASTRODUODENOSCOPY (EGD) WITH PROPOFOL N/A 06/22/2018   Procedure: ESOPHAGOGASTRODUODENOSCOPY (EGD) WITH PROPOFOL;  Surgeon: Rachael Fee, MD;  Location: [facility] ENDOSCOPY;  Service: Endoscopy;  Laterality: N/A;   ESOPHAGOGASTRODUODENOSCOPY (EGD) WITH PROPOFOL N/A 07/09/2018   Procedure: ESOPHAGOGASTRODUODENOSCOPY (EGD) WITH PROPOFOL with resection of gastric polyps;  Surgeon: Wyline Mood, MD;  Location: [facility] ENDOSCOPY;  Service: Gastroenterology;  Laterality: N/A;   ESOPHAGOGASTRODUODENOSCOPY (EGD) WITH PROPOFOL N/A 05/17/2019   Procedure: ESOPHAGOGASTRODUODENOSCOPY (EGD) WITH PROPOFOL;  Surgeon: Wyline Mood, MD;  Location: [facility] ENDOSCOPY;  Service: Gastroenterology;  Laterality: N/A;   EUS N/A 06/22/2018   Procedure: UPPER ENDOSCOPIC ULTRASOUND (EUS) RADIAL;  Surgeon: Rachael Fee, MD;  Location: [facility] ENDOSCOPY;  Service: Endoscopy;  Laterality: N/A;   EYE SURGERY Bilateral    cataract extractions   HUMERUS IM NAIL Left 06/02/2022   Procedure: INTRAMEDULLARY (IM) NAIL HUMERAL;  Surgeon: Christena Flake, MD;  Location: [facility] ORS;  Service: Orthopedics;  Laterality: Left;   JOINT REPLACEMENT Bilateral 2014   Bilateral Knee replacement   LEAD REVISION/REPAIR N/A 08/29/2017   Procedure: LEAD REVISION/REPAIR;   Surgeon: Regan Lemming, MD;  Location: [facility] INVASIVE CV LAB;  Service: Cardiovascular;  Laterality: N/A;   OOPHORECTOMY     PACEMAKER INSERTION Left 07/01/2015   Procedure: INSERTION PACEMAKER;  Surgeon: Marcina Millard, MD;  Location: [facility] ORS;  Service: Cardiovascular;  Laterality: Left;   PACEMAKER REVISION N/A 08/28/2017   Procedure: PACEMAKER REVISION;  Surgeon: Duke Salvia, MD;  Location: [facility] INVASIVE CV LAB;  Service: Cardiovascular;  Laterality: N/A;   REPLACEMENT TOTAL KNEE BILATERAL     SHOULDER ARTHROSCOPY WITH ROTATOR CUFF REPAIR AND OPEN BICEPS TENODESIS Left 11/21/2019   Procedure: LEFT SHOULDER ARTHROSCOPY WITH DEBRIDEMENT, DECOMPRESSION, ROTATOR CUFF REPAIR AND BICEPS TENOLYSIS;  Surgeon: Christena Flake, MD;  Location: [facility] ORS;  Service: Orthopedics;  Laterality: Left;   SHOULDER ARTHROSCOPY WITH SUBACROMIAL DECOMPRESSION Left 2013   TEE WITHOUT CARDIOVERSION N/A 06/28/2017   Procedure: TRANSESOPHAGEAL ECHOCARDIOGRAM (TEE);  Surgeon: Lamar Blinks, MD;  Location: [facility] ORS;  Service: Cardiovascular;  Laterality: N/A;     Allergies  Allergies  Allergen Reactions   Cephalosporins Other (See Comments), Diarrhea and Nausea Only    Reaction:  Unknown  Other  reaction(s): Other (see comments) Other reaction(s): Other (See Comments) Other Reaction: Intolerance  Reaction:  Stomach pain  Other Reaction: Intolerance Reaction:  Unknown    Fish Allergy Shortness Of Breath    All kinds of fish. Severe vomitting even with smell of fish.    Macrolides And Ketolides Other (See Comments)    Severe stomach pain (mycins)   Meperidine Shortness Of Breath, Nausea Only and Other (See Comments)    Stomach pain    Other Nausea Only, Swelling, Rash, Anaphylaxis, Diarrhea, Shortness Of Breath and Other (See Comments)    Allergen: NON-STEROIDS, bextra - hands and feet swell  Reaction:  Stomach pain   Other Reaction: Intolerance   Prednisone Anaphylaxis and Other (See  Comments)    (facial swelling/redness/burning)Increases pts blood sugar  Pt states that she is allergic to all steroids.     Shellfish Allergy Anaphylaxis, Shortness Of Breath, Diarrhea, Nausea Only and Rash    Stomach pain    Sulfa Antibiotics Shortness Of Breath, Diarrhea, Nausea Only and Other (See Comments)    Reaction:  Stomach pain   Reaction:  Stomach pain  Other Reaction: Intolerance   Sulfacetamide Sodium Diarrhea, Nausea Only, Other (See Comments) and Shortness Of Breath    Reaction:  Stomach pain    Telbivudine Other (See Comments)    Unknown reaction   Uloric [Febuxostat] Anaphylaxis    Locks pt's body up    Aspirin Rash and Other (See Comments)    Stomach pain (tolerates lower doses)   Celecoxib Other (See Comments)    GI bleed, weakness, and stomach pain.     Cephalexin Diarrhea, Nausea Only and Other (See Comments)    stomach pain    Codeine Diarrhea, Nausea Only and Other (See Comments)    Reaction:  Stomach pain Pt tolerates morphine    Dilaudid [Hydromorphone Hcl] Other (See Comments)    Reactions: easy to overdose - blood pressure drops really low   Erythromycin Diarrhea, Nausea Only and Other (See Comments)    Reaction:  Stomach pain    Iodinated Contrast Media Rash and Other (See Comments)    Pt states that she is unable to have because she has chronic kidney disease.   CKD Pt states that she is unable to have because she has chronic kidney disease.   CKD    Lipitor [Atorvastatin Calcium] Nausea Only    Reaction: nausea, weakness, pass blood   Oxycodone Other (See Comments)    Reaction:  Stomach pain    Aleve [Naproxen]     Reaction: severe stomach pain   Atorvastatin Nausea Only and Other (See Comments)    Weakness    Doxycycline     Stomach pain    Iodine Rash   Motrin [Ibuprofen]     Reaction: severe stomach pain   Tape Other (See Comments)    Causes pts skin to tear  Pt states that she is able to use paper tape.       Valdecoxib Swelling  and Other (See Comments)    Pt states that her hands and feet swell.      History of Present Illness    82 y.o. female with a history of sinus node dysfxn s/p PPM, paroxysmal afib/flutter, hfpef, HTN, HL, nonobs CAD, pulmonary HTN, CKD IV, DMII, TIA, anemia, GERD, anxiety, depression, and hypothyroidism.  H/o sinus node dysfxn w/ prior single lead PPM followed by dual chamber upgrade in 08/2017, complicatd by encircling of the chronic RV lead by the atrial  lead requiring RA lead revision.  Her afib/flutter has been managed w/ amio and dose adjusted eliquis (age/creat).  Diagnostic cath in 2017, showed mild nonobstructive LM, LAD, and LCX dzs.  Nuc study in 2019 was low risk.  She was last seen in EP clinic on 4/23, at which time she was doing well and her device was functioning normally.  Her nephrologist recently put her on midodrine 5mg  TID in the setting orthostasis and BP of 91/59 on 5/8.  Up to this point, pt hasn't noted a difference in orthostasis.  Ms. Werne presented to [facility] on [facility] following a mechanical fall @ her SNF, where she tripped over her walker, landing on her R hip w/ resultant pain.  In the ED, she was found to have a comminuted intertrochanteric R hip fx w/ impaction and varus angulation.  Hypotensive in ED w/ maps in low 60's.  ECG w/ A pacing/V sensing @ 62.  Labs notable for stable creat @ 1.83, mild chronic Tbili and AST elev @ 1.7 and 72 respectively.  H/H nl.  Admitted to medicine service and seen by ortho  S/p intramedullary nailing of Right femur with cephalomedullary device  on 5/11.  Post-op, became more hypotensive and required pressors. ABG w/ pH of 7.19 and CO2 of 60.  Bedside echo dilated RV w/ calculated CO of 1.6 l/min.  ECG A paced @ 62.  H/H down from 13/39.2 on admission to 9.3/29.8 this AM. WBC 16.1.  BUN/Creat up to 51/2.68.  HsTrops 45  52  61  101.  BNP 1487.  Complete echo this AM shows moderate RV dysf w/ RVSP 51.81mmHg, similar to 2019 study.  In the setting  of PH, mod-sev TR noted.  LVEF is nl @ 60-65%.  COVID swab has just returned positive.  Currently c/o hip pain.  Inpatient Medications     allopurinol  100 mg Oral Daily   amiodarone  200 mg Oral Daily   Chlorhexidine Gluconate Cloth  6 each Topical Daily   cyanocobalamin  1,000 mcg Oral Daily   docusate sodium  100 mg Oral BID   folic acid  1 mg Intravenous Daily   gabapentin  100 mg Oral BID   hydrocortisone sod succinate (SOLU-CORTEF) inj  100 mg Intravenous Q12H   insulin aspart  0-15 Units Subcutaneous Q4H   levothyroxine  37.5 mcg Oral QAC breakfast   midodrine  5 mg Oral TID WC   nirmatrelvir/ritonavir (renal dosing)  2 tablet Oral BID   oxybutynin  5 mg Oral TID   rOPINIRole  0.25 mg Oral QHS   sertraline  25 mg Oral Daily   sertraline  50 mg Oral Daily   thiamine  100 mg Oral Daily    Family History    Family History  Problem Relation Age of Onset   Diabetes Mellitus II Mother    CAD Mother    Heart attack Mother    Cancer Father        skin   Diabetes Mellitus II Brother    Stroke Brother    Breast cancer Neg Hx    She indicated that her mother is deceased. She indicated that her father is deceased. She indicated that her brother is alive. She indicated that the status of her neg hx is unknown.   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    Comment: retired  Tobacco Use   Smoking status: Never   Smokeless tobacco: Never  Vaping Use   Vaping Use: Never used  Substance and Sexual Activity   Alcohol use: No   Drug use: No   Sexual activity: Yes  Other Topics Concern   Not on file  Social History Narrative   DIABETIC   PACEMAKER   Social Determinants of Health   Financial Resource Strain: Not on file  Food Insecurity:  No Food Insecurity (01/20/2023)   Hunger Vital Sign    Worried About Running Out of Food in the Last Year: Never true    Ran Out of Food in the Last Year: Never true  Transportation Needs: No Transportation Needs (01/20/2023)   PRAPARE - Administrator, Civil Service (Medical): No    Lack of Transportation (Non-Medical): No  Physical Activity: Not on file  Stress: Not on file  Social Connections: Not on file  Intimate Partner Violence: Not At Risk (01/20/2023)   Humiliation, Afraid, Rape, and Kick questionnaire    Fear of Current or Ex-Partner: No    Emotionally Abused: No    Physically Abused: No    Sexually Abused: No     Review of Systems    General:  No chills, fever, night sweats or weight changes.  Cardiovascular:  +++ recent orthostasis and hypotension.  No chest pain, dyspnea on exertion, edema, orthopnea, palpitations, paroxysmal nocturnal dyspnea. Dermatological: No rash, lesions/masses Respiratory: No cough, dyspnea Urologic: No hematuria, dysuria Abdominal:   No nausea, vomiting, diarrhea, bright red blood per rectum, melena, or hematemesis Neurologic:  No visual changes, wkns, changes in mental status. MSK: +++ R hip pain. All other systems reviewed and are otherwise negative except as noted above.  Physical Exam    Blood pressure (!) 102/58, pulse 63, temperature 99 F (37.2 C), temperature source Oral, resp. rate (!) 30, height 5\' 3"  (1.6 m), weight 70.1 kg, SpO2 100 %.  General: Pleasant, NAD Psych: Normal affect. Neuro: Alert and oriented X 3. Moves all extremities spontaneously. HEENT: Normal  Neck: Supple without bruits or JVD. Lungs:  Resp regular and unlabored, CTA. Heart: RRR no s3, s4. 2/6 syst murmur along L sternal border. Abdomen: Soft, non-tender, non-distended, BS + x 4.  Extremities: No clubbing, cyanosis or edema. DP/PT2+, Radials 2+ and equal bilaterally.  Labs    Cardiac Enzymes Recent Labs  Lab 01/21/23 1239 01/21/23 1503  01/21/23 1712 01/22/23 0813  TROPONINIHS 45* 52* 61* 101*     BNP    Component Value Date/Time   BNP 1,487.0 (H) 01/21/2023 1239   Lab Results  Component Value Date   WBC 16.1 (H) 01/22/2023   HGB 8.9 (L) 01/22/2023   HCT 28.1 (L) 01/22/2023   MCV 103.8 (H) 01/22/2023   PLT 143 (L) 01/22/2023    Recent Labs  Lab 01/22/23 0246  NA 138  K 4.3  CL 107  CO2 23  BUN 51*  CREATININE 2.68*  CALCIUM 8.0*  PROT 6.1*  BILITOT 0.7  ALKPHOS 119  ALT 40  AST 72*  GLUCOSE 239*   Lab Results  Component Value Date   CHOL 129 06/12/2017   HDL 46 06/12/2017   LDLCALC 61 06/12/2017   TRIG 110 06/12/2017   Lab Results  Component Value Date   DDIMER 2.78 (H) 01/21/2023  Radiology Studies    US Abdomen Limited RUQ (LIVER/GB)  Result Date: 01/21/2023 CLINICAL DATA:  Elevated LFTs EXAM: ULTRASOUND ABDOMEN LIMITED RIGHT UPPER QUADRANT COMPARISON:  03/04/2022 FINDINGS: Gallbladder: Surgically removed Common bile duct: Diameter: 6.4 mm. Liver: Increased echogenicity without focal mass likely related to fatty infiltration. Portal vein is patent on color Doppler imaging with normal direction of blood flow towards the liver. Other: None. IMPRESSION: Fatty liver. Status post cholecystectomy. Electronically Signed   By: Alcide Clever M.D.   On: 01/21/2023 20:22   DG Chest Port 1 View  Result Date: 01/21/2023 CLINICAL DATA:  Check central line placement EXAM: PORTABLE CHEST 1 VIEW COMPARISON:  01/20/23 FINDINGS: Cardiac shadow is stable. Pacing device is again seen. New right jugular central line is seen with the catheter tip in the distal superior vena cava. No pneumothorax is seen. The lungs are clear. Changes of prior vertebral augmentation are noted in the lower thoracic spine. IMPRESSION: No pneumothorax following central line placement. Electronically Signed   By: Alcide Clever M.D.   On: 01/21/2023 20:17   DG HIP UNILAT WITH PELVIS 2-3 VIEWS LEFT  Result Date: 01/21/2023 CLINICAL  DATA:  Right hip fracture repair EXAM: DG HIP (WITH OR WITHOUT PELVIS) 2-3V LEFT COMPARISON:  None Available. FINDINGS: Seven fluoroscopic images were obtained during right hip fracture repair. By the end of the study, gamma nails and an intramedullary femoral rod extend across the fracture site with a distal interlocking screw. No other abnormalities. IMPRESSION: Right hip fracture repair as above. Electronically Signed   By: Gerome Sam III M.D.   On: 01/21/2023 12:15   DG C-Arm 1-60 Min-No Report  Result Date: 01/21/2023 Fluoroscopy was utilized by the requesting physician.  No radiographic interpretation.   DG C-Arm 1-60 Min-No Report  Result Date: 01/21/2023 Fluoroscopy was utilized by the requesting physician.  No radiographic interpretation.   DG Chest Portable 1 View  Result Date: 01/20/2023 CLINICAL DATA:  Tripped and fell, right hip pain EXAM: PORTABLE CHEST 1 VIEW COMPARISON:  06/03/2022 FINDINGS: Single frontal view of the chest demonstrates a stable dual lead pacemaker. Cardiac silhouette is unremarkable. No acute airspace disease, effusion, or pneumothorax. No acute displaced fracture. Chronic lower thoracic compression deformity and prior vertebral augmentation. IMPRESSION: 1. No acute intrathoracic process. Electronically Signed   By: Sharlet Salina M.D.   On: 01/20/2023 11:04   DG Hip Unilat W or Wo Pelvis 2-3 Views Right  Result Date: 01/20/2023 CLINICAL DATA:  Right hip pain EXAM: DG HIP (WITH OR WITHOUT PELVIS) 2-3V RIGHT COMPARISON:  04/17/2018 FINDINGS: Frontal view of the pelvis as well as frontal and frogleg lateral views of the right hip are obtained. There is a comminuted intertrochanteric right hip fracture, with impaction and varus angulation at the fracture site. No dislocation. Mild symmetrical bilateral hip osteoarthritis. Postsurgical changes at the lumbosacral junction. IMPRESSION: 1. Comminuted intertrochanteric right hip fracture, with impaction and varus  angulation at the fracture site. 2. Mild symmetrical bilateral hip osteoarthritis. Electronically Signed   By: Sharlet Salina M.D.   On: 01/20/2023 11:01   ECG & Cardiac Imaging    A paced, V sensed, 62 - personally reviewed. Tele: A paced, V sensed, rates 60's to low 80's - personally reviewed.  Assessment & Plan    1.  Hypotension/Shock:  Pt w/ recent h/o orthostasis and relative hypotension in the outpt setting prompting initiation of midodrine by Dr. Thedore Mins. She presented 5/11 following fall w/ R hip fx.  Noted to be  relatively hypotensive in ED and now s/p R intramedullary nail 5/11 w/ worsening hypotension in the setting of ABL anemia (H/H 13.4/41.2 on 4/23  8.9/28.1 this AM), pain medications, and newly dx COVID infection.  Currently on norepi and vasopressin per CCM.  Echo w/ low-nl RV fxn, nl LVEF, and PH - overall similar to 2019 study.  No indication for inotropic therapy at this time.  A paced w/ V sensing, 60's to low 80's on tele.  No indication for adjusting pacing rate at this time  Supportive mgmt for hypotension per CCM.  2.  Demand Ischemia/Nonobs CAD:  h/o nonobs CAD by cath in 08/2016 and neg MV in 10/2017.  In the setting of hypotension, HsTrops mildly elev @ 45  52  61  101.  No chest pain. Echo w/ nl LVEF, stable, mild RV dysfxn.  No chest pain or dyspnea.  Suspect demand ischemia.  No role for ischemic eval @ this time. Will consider outpt myoview post-discharge.  Plan to resume statin.  No ? blocker in the setting of hypotension.  3.  Chronic HFpEF:  As above, nl LVEF w/ PH and mildly reduced RV fxn by echo this AM.  Does not appear to be significantly volume overloaded.  Hypotensive on pressors.  Watch closely in the setting of volume resuscitation.  4.  PAF/Flutter:  A paced, V sensed.  Hold amio as paxlovid ordered by primary team for COVID mgmt.  On eliquis @ home - held for surgery and current anemia.  Plan to resume.  5.  HL:  plan to resume statin.  6.  CKD IV/AKI:   Creat 2.68 in setting of hypotension/anemia.  Follow.  7.  DMII:  Per CCM.  8.  ABL Anemia:  H/H 13.4/41.2 on 4/23  8.9/28.1 this AM.  Fall while on chronic eliquis.  Follow.  Signed, Nicolasa Ducking, NP 01/22/2023, 11:28 AM  For questions or updates, please contact   Please consult [email or web address] for contact info under Cardiology/STEMI.

## 2023-01-23 ENCOUNTER — Encounter: Payer: Self-pay | Admitting: Orthopedic Surgery

## 2023-01-23 DIAGNOSIS — I5032 Chronic diastolic (congestive) heart failure: Secondary | ICD-10-CM | POA: Diagnosis not present

## 2023-01-23 DIAGNOSIS — S72001A Fracture of unspecified part of neck of right femur, initial encounter for closed fracture: Secondary | ICD-10-CM | POA: Diagnosis not present

## 2023-01-23 DIAGNOSIS — I4821 Permanent atrial fibrillation: Secondary | ICD-10-CM | POA: Diagnosis not present

## 2023-01-23 DIAGNOSIS — I480 Paroxysmal atrial fibrillation: Secondary | ICD-10-CM | POA: Diagnosis not present

## 2023-01-23 DIAGNOSIS — R579 Shock, unspecified: Secondary | ICD-10-CM | POA: Diagnosis not present

## 2023-01-23 LAB — HEMOGLOBIN AND HEMATOCRIT, BLOOD
HCT: 24.1 % — ABNORMAL LOW (ref 36.0–46.0)
HCT: 22.1 % — ABNORMAL LOW (ref 36.0–46.0)
HCT: 24.9 % — ABNORMAL LOW (ref 36.0–46.0)
Hemoglobin: 7.8 g/dL — ABNORMAL LOW (ref 12.0–15.0)
Hemoglobin: 7.1 g/dL — ABNORMAL LOW (ref 12.0–15.0)
Hemoglobin: 8.1 g/dL — ABNORMAL LOW (ref 12.0–15.0)

## 2023-01-23 LAB — TYPE AND SCREEN

## 2023-01-23 LAB — CBC
HCT: 23.4 % — ABNORMAL LOW (ref 36.0–46.0)
Hemoglobin: 7.6 g/dL — ABNORMAL LOW (ref 12.0–15.0)
MCH: 31.9 pg (ref 26.0–34.0)
MCHC: 32.5 g/dL (ref 30.0–36.0)
MCV: 98.3 fL (ref 80.0–100.0)
Platelets: 102 10*3/uL — ABNORMAL LOW (ref 150–400)
RBC: 2.38 MIL/uL — ABNORMAL LOW (ref 3.87–5.11)
RDW: 13.8 % (ref 11.5–15.5)
WBC: 10 10*3/uL (ref 4.0–10.5)
nRBC: 0 % (ref 0.0–0.2)

## 2023-01-23 LAB — PROTIME-INR
INR: 1.3 — ABNORMAL HIGH (ref 0.8–1.2)
Prothrombin Time: 16.7 seconds — ABNORMAL HIGH (ref 11.4–15.2)

## 2023-01-23 LAB — BASIC METABOLIC PANEL
Anion gap: 13 (ref 5–15)
BUN: 50 mg/dL — ABNORMAL HIGH (ref 8–23)
CO2: 19 mmol/L — ABNORMAL LOW (ref 22–32)
Calcium: 8 mg/dL — ABNORMAL LOW (ref 8.9–10.3)
Chloride: 104 mmol/L (ref 98–111)
Creatinine, Ser: 2.01 mg/dL — ABNORMAL HIGH (ref 0.44–1.00)
GFR, Estimated: 24 mL/min — ABNORMAL LOW (ref >60)
Glucose, Bld: 216 mg/dL — ABNORMAL HIGH (ref 70–99)
Potassium: 4.2 mmol/L (ref 3.5–5.1)
Sodium: 136 mmol/L (ref 135–145)

## 2023-01-23 LAB — CBC WITH DIFFERENTIAL/PLATELET
Abs Immature Granulocytes: 0.05 10*3/uL (ref 0.00–0.07)
Basophils Absolute: 0 10*3/uL (ref 0.0–0.1)
Basophils Relative: 0 %
Eosinophils Absolute: 0 10*3/uL (ref 0.0–0.5)
Eosinophils Relative: 0 %
HCT: 23.9 % — ABNORMAL LOW (ref 36.0–46.0)
Hemoglobin: 7.8 g/dL — ABNORMAL LOW (ref 12.0–15.0)
Immature Granulocytes: 1 %
Lymphocytes Relative: 8 %
Lymphs Abs: 0.8 10*3/uL (ref 0.7–4.0)
MCH: 32.4 pg (ref 26.0–34.0)
MCHC: 32.6 g/dL (ref 30.0–36.0)
MCV: 99.2 fL (ref 80.0–100.0)
Monocytes Absolute: 0.7 10*3/uL (ref 0.1–1.0)
Monocytes Relative: 7 %
Neutro Abs: 8 10*3/uL — ABNORMAL HIGH (ref 1.7–7.7)
Neutrophils Relative %: 84 %
Platelets: 119 10*3/uL — ABNORMAL LOW (ref 150–400)
RBC: 2.41 MIL/uL — ABNORMAL LOW (ref 3.87–5.11)
RDW: 14.1 % (ref 11.5–15.5)
WBC: 9.5 10*3/uL (ref 4.0–10.5)
nRBC: 0 % (ref 0.0–0.2)

## 2023-01-23 LAB — PHOSPHORUS: Phosphorus: 3.5 mg/dL (ref 2.5–4.6)

## 2023-01-23 LAB — TECHNOLOGIST SMEAR REVIEW: Plt Morphology: NORMAL

## 2023-01-23 LAB — GLUCOSE, CAPILLARY
Glucose-Capillary: 259 mg/dL — ABNORMAL HIGH (ref 70–99)
Glucose-Capillary: 206 mg/dL — ABNORMAL HIGH (ref 70–99)
Glucose-Capillary: 166 mg/dL — ABNORMAL HIGH (ref 70–99)
Glucose-Capillary: 183 mg/dL — ABNORMAL HIGH (ref 70–99)
Glucose-Capillary: 212 mg/dL — ABNORMAL HIGH (ref 70–99)
Glucose-Capillary: 248 mg/dL — ABNORMAL HIGH (ref 70–99)
Glucose-Capillary: 231 mg/dL — ABNORMAL HIGH (ref 70–99)

## 2023-01-23 LAB — CULTURE, BLOOD (ROUTINE X 2)

## 2023-01-23 LAB — D-DIMER, QUANTITATIVE: D-Dimer, Quant: 0.94 ug/mL-FEU — ABNORMAL HIGH (ref 0.00–0.50)

## 2023-01-23 LAB — APTT
aPTT: >200 seconds (ref 24–36)
aPTT: 102 seconds — ABNORMAL HIGH (ref 24–36)
aPTT: 124 seconds — ABNORMAL HIGH (ref 24–36)

## 2023-01-23 LAB — HEPARIN LEVEL (UNFRACTIONATED)
Heparin Unfractionated: >1.1 IU/mL — ABNORMAL HIGH (ref 0.30–0.70)
Heparin Unfractionated: 1.08 IU/mL — ABNORMAL HIGH (ref 0.30–0.70)

## 2023-01-23 LAB — FIBRINOGEN: Fibrinogen: 447 mg/dL (ref 210–475)

## 2023-01-23 LAB — PROCALCITONIN: Procalcitonin: 1.23 ng/mL

## 2023-01-23 LAB — BPAM RBC: Unit Type and Rh: 6200

## 2023-01-23 LAB — MAGNESIUM: Magnesium: 2.2 mg/dL (ref 1.7–2.4)

## 2023-01-23 MED ORDER — SODIUM CHLORIDE 0.9% IV SOLUTION: Freq: Once | INTRAVENOUS | Status: AC

## 2023-01-23 MED ORDER — LACTATED RINGERS IV BOLUS
1000.0000 mL | Freq: Once | INTRAVENOUS | Status: AC
  Administered 2023-01-23: 1000 mL via INTRAVENOUS

## 2023-01-23 MED ORDER — APIXABAN 2.5 MG PO TABS
2.5000 mg | ORAL_TABLET | Freq: Two times a day (BID) | ORAL | Status: DC
  Administered 2023-01-23 – 2023-02-01 (×19): 2.5 mg via ORAL
  Filled 2023-01-23 (×19): qty 1

## 2023-01-23 MED ORDER — TRAMADOL HCL 50 MG PO TABS
50.0000 mg | ORAL_TABLET | Freq: Four times a day (QID) | ORAL | Status: DC | PRN
  Administered 2023-01-23 – 2023-02-01 (×25): 50 mg via ORAL
  Filled 2023-01-23 (×26): qty 1

## 2023-01-23 MED ORDER — AMIODARONE HCL 200 MG PO TABS
200.0000 mg | ORAL_TABLET | Freq: Two times a day (BID) | ORAL | Status: DC
  Administered 2023-01-23 – 2023-01-27 (×9): 200 mg via ORAL
  Filled 2023-01-23 (×9): qty 1

## 2023-01-23 MED ORDER — MIDODRINE HCL 5 MG PO TABS
10.0000 mg | ORAL_TABLET | Freq: Three times a day (TID) | ORAL | Status: DC
  Administered 2023-01-23 – 2023-01-28 (×14): 10 mg via ORAL
  Filled 2023-01-23 (×15): qty 2

## 2023-01-23 MED ORDER — INSULIN ASPART 100 UNIT/ML IJ SOLN
0.0000 [IU] | INTRAMUSCULAR | Status: DC
  Administered 2023-01-23 – 2023-01-24 (×2): 7 [IU] via SUBCUTANEOUS
  Administered 2023-01-24: 4 [IU] via SUBCUTANEOUS
  Administered 2023-01-24: 7 [IU] via SUBCUTANEOUS
  Administered 2023-01-24: 4 [IU] via SUBCUTANEOUS
  Administered 2023-01-24 – 2023-01-25 (×2): 7 [IU] via SUBCUTANEOUS
  Administered 2023-01-25: 4 [IU] via SUBCUTANEOUS
  Administered 2023-01-25: 3 [IU] via SUBCUTANEOUS
  Administered 2023-01-25 (×2): 7 [IU] via SUBCUTANEOUS
  Administered 2023-01-25: 11 [IU] via SUBCUTANEOUS
  Administered 2023-01-26: 7 [IU] via SUBCUTANEOUS
  Administered 2023-01-26: 4 [IU] via SUBCUTANEOUS
  Administered 2023-01-27 (×2): 3 [IU] via SUBCUTANEOUS
  Administered 2023-01-27: 4 [IU] via SUBCUTANEOUS
  Administered 2023-01-27: 3 [IU] via SUBCUTANEOUS
  Administered 2023-01-28: 11 [IU] via SUBCUTANEOUS
  Administered 2023-01-28: 7 [IU] via SUBCUTANEOUS
  Administered 2023-01-29: 4 [IU] via SUBCUTANEOUS
  Administered 2023-01-29: 7 [IU] via SUBCUTANEOUS
  Administered 2023-01-29: 4 [IU] via SUBCUTANEOUS
  Administered 2023-01-29: 7 [IU] via SUBCUTANEOUS
  Administered 2023-01-29: 4 [IU] via SUBCUTANEOUS
  Administered 2023-01-29: 3 [IU] via SUBCUTANEOUS
  Administered 2023-01-30: 7 [IU] via SUBCUTANEOUS
  Administered 2023-01-30: 4 [IU] via SUBCUTANEOUS
  Administered 2023-01-30: 7 [IU] via SUBCUTANEOUS
  Administered 2023-01-30: 4 [IU] via SUBCUTANEOUS
  Administered 2023-01-31: 3 [IU] via SUBCUTANEOUS
  Administered 2023-01-31: 4 [IU] via SUBCUTANEOUS
  Administered 2023-01-31: 3 [IU] via SUBCUTANEOUS
  Administered 2023-01-31: 11 [IU] via SUBCUTANEOUS
  Administered 2023-02-01: 3 [IU] via SUBCUTANEOUS
  Filled 2023-01-23 (×34): qty 1

## 2023-01-23 MED ORDER — FOLIC ACID 1 MG PO TABS
1.0000 mg | ORAL_TABLET | Freq: Every day | ORAL | Status: DC
  Administered 2023-01-24 – 2023-02-01 (×9): 1 mg via ORAL
  Filled 2023-01-23 (×9): qty 1

## 2023-01-23 MED ORDER — ENSURE ENLIVE PO LIQD
237.0000 mL | Freq: Two times a day (BID) | ORAL | Status: DC
  Administered 2023-01-23 – 2023-02-01 (×10): 237 mL via ORAL

## 2023-01-23 MED ORDER — ADULT MULTIVITAMIN W/MINERALS CH
1.0000 | ORAL_TABLET | Freq: Every day | ORAL | Status: DC
  Administered 2023-01-24 – 2023-02-01 (×9): 1 via ORAL
  Filled 2023-01-23 (×9): qty 1

## 2023-01-23 MED ORDER — FAMOTIDINE 20 MG PO TABS
20.0000 mg | ORAL_TABLET | Freq: Every day | ORAL | Status: DC
  Administered 2023-01-23 – 2023-01-31 (×9): 20 mg via ORAL
  Filled 2023-01-23 (×9): qty 1

## 2023-01-23 NOTE — Progress Notes (Signed)
Subjective: 2 Days Post-Op Procedure(s) (LRB): INTRAMEDULLARY (IM) NAIL INTERTROCHANTERIC (Right) Patient reports pain as mild to moderate.  Feels better this AM Patient is in ICU for Covid Positive with CKD.  Breathing normally.  No cough. PT order is placed, was seen earlier today by physical therapy who put off working with the patient given the fact that she is on pressors to help with her blood pressure.  States they will reevaluate tomorrow  Objective: Vital signs in last 24 hours: Temp:  [98.4 F (36.9 C)-99 F (37.2 C)] 98.4 F (36.9 C) (05/13 0400) Pulse Rate:  [57-127] 64 (05/13 0645) Resp:  [11-40] 17 (05/13 0645) BP: (84-169)/(36-90) 112/42 (05/13 0645) SpO2:  [90 %-100 %] 92 % (05/13 0645) Arterial Line BP: (127-184)/(38-63) 127/46 (05/12 1626) FiO2 (%):  [28 %] 28 % (05/12 2242)  Intake/Output from previous day:  Intake/Output Summary (Last 24 hours) at 01/23/2023 0716 Last data filed at 01/23/2023 0630 Gross per 24 hour  Intake 1554.26 ml  Output 1960 ml  Net -405.74 ml    Intake/Output this shift: No intake/output data recorded.  Labs: Recent Labs    01/22/23 0845 01/22/23 1400 01/22/23 2135 01/23/23 0410 01/23/23 0630  HGB 8.9* 8.4* 7.8* 7.6* 7.8*   Recent Labs    01/22/23 0246 01/22/23 0845 01/23/23 0410 01/23/23 0630  WBC 16.1*  --  10.0  --   RBC 2.87*  --  2.38*  --   HCT 29.8*   < > 23.4* 24.1*  PLT 143*  --  102*  --    < > = values in this interval not displayed.   Recent Labs    01/22/23 0246 01/23/23 0410  NA 138 136  K 4.3 4.2  CL 107 104  CO2 23 19*  BUN 51* 50*  CREATININE 2.68* 2.01*  GLUCOSE 239* 216*  CALCIUM 8.0* 8.0*   Recent Labs    01/20/23 1045 01/21/23 1239  INR 1.2 1.3*    EXAM General - Patient is Alert, Appropriate, and Oriented Extremity - Neurologically intact ABD soft Neurovascular intact Sensation intact distally Intact pulses distally Dorsiflexion/Plantar flexion intact No cellulitis  present Compartment soft There is not appear to be any hematoma formation along the hip-there is minimal to no ecchymosis noted on physical exam Noted pain to palpation over the right hip, incision site and mid femur. Dressing -  dressing is intact, there is a mild amount of blood on the honeycomb, does not need to be changed at this moment Motor Function - intact, moving foot and toes well on exam.  Able to plantar and dorsiflex with good range of motion and strength.  Patient has difficulty with straight leg raise due to the pain.  Patient is neurovascularly intact -sensation across all right lower leg dermatomes is intact and pedal pulses appreciated.  Past Medical History:  Diagnosis Date   Acid reflux    Acute respiratory failure (HCC) 04/09/2018   Anemia    Anxiety    Bowel obstruction (HCC)    Carotid artery disease (HCC)    Chronic heart failure with preserved ejection fraction (HFpEF) (HCC)    a. 03/2018 Echo: EF 60-65%, mildly dil LA/RA, mod dil RV, mod TR, PASP ; b. 01/2023 Echo: EF 60-65%, no rwma, low-nl RV fxn, RVSP 51.34mmHg. Mild MR. Mod-sev TR.   CKD (chronic kidney disease), stage IV (HCC)    stage IV   Closed fracture of one rib of right side 05/29/2020   Complete tear of left rotator  cuff 10/16/2014   Coronary artery disease (non-obstructive)    a. 08/2016 Cath: LM 25, LAD 20p, LCX 15p, RCA nl, EF 55%, 2+ MR; 10/2017 MV: No isch/infarct, EF 55-65%.   Depression    Diabetes mellitus without complication (HCC)    Diabetic neuropathy (HCC)    Diarrhea 03/04/2022   Fatty liver    Gastritis without bleeding    GI bleed    Gout    Hypertension    Hypothyroidism    Low back pain    history of kyphoplasty t12-l1   Morbid obesity (HCC)    Multiple gastric polyps    history of gi bleed.  polyps removed   Osteoarthritis    Osteoporosis    PAF (paroxysmal atrial fibrillation) (HCC) 2016   a. On amio/eliquis (CHA2DS2VASc = 9).   Paroxysmal atrial fibrillation (HCC)     Presence of permanent cardiac pacemaker    Pulmonary hypertension (HCC)    a. 03/2018 Echo: PASP .   Sinus node dysfunction (HCC) 08/24/2017   Sinus node dysfunction (HCC)    a. MDT- upgraded to dual chamber device Dec 2018- lead revision post upgrade dec 2018 (MDT [id number] Azure XT DR MRI).   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   Uncontrolled type 2 diabetes mellitus with hypoglycemia, with long-term current use of insulin (HCC) 03/04/2022    Assessment/Plan: 2 Days Post-Op Procedure(s) (LRB): INTRAMEDULLARY (IM) NAIL INTERTROCHANTERIC (Right) Principal Problem:   Shock (HCC) Active Problems:   Permanent atrial fibrillation (HCC)   Gastro-esophageal reflux disease without esophagitis   Gout   Hyperlipemia, mixed   CAD (coronary artery disease)   UTI (urinary tract infection)   Hypothyroidism   Acute renal failure superimposed on stage 4 chronic kidney disease (HCC)   Chronic heart failure with preserved ejection fraction (HCC)   Transaminitis   Hypotension after procedure   Closed fracture of right hip (HCC)   Nausea   Pulmonary hypertension (HCC)   Metabolic acidosis   Fall COVID-positive  Estimated body mass index is 27.38 kg/m as calculated from the following:   Height as of this encounter: 5\' 3"  (1.6 m).   Weight as of this encounter: 70.1 kg. Up with therapy -once the patient is more stable, urge to get the patient moving with physical therapy, WBAT  Patient's incision site looks good, with minimal sanguinous drainage.  No signs of infection. Patient is neurovascularly intact on her right leg   DVT Prophylaxis -  may resume baseline Eliquis.  However is being currently worked up by cardiology, pharmacy and inpatient medicine for heparin infusion Weight Bearing As Tolerated right leg Can work with therapy once stable enough  to do so  Labs and vitals reviewed - has been soft on BP managed with pressors. HGB is steadily dropping - most recent 7.8 - no formation of hematoma formation in the patients leg - recheck with labs already placed by inpatient medicine.  Will continue to work with inpatient medicine, cardiology and pharmacy to help manage this patient's other comorbidities.   Dedra Skeens, PA-C [facility] 01/23/2023, 7:16 AM

## 2023-01-23 NOTE — Progress Notes (Signed)
ANTICOAGULATION CONSULT NOTE  Pharmacy Consult for heparin infusion Indication: pulmonary embolus  Allergies  Allergen Reactions   Cephalosporins Other (See Comments), Diarrhea and Nausea Only    Reaction:  Unknown  Other reaction(s): Other (see comments) Other reaction(s): Other (See Comments) Other Reaction: Intolerance  Reaction:  Stomach pain  Other Reaction: Intolerance Reaction:  Unknown    Fish Allergy Shortness Of Breath    All kinds of fish. Severe vomitting even with smell of fish.    Macrolides And Ketolides Other (See Comments)    Severe stomach pain (mycins)   Meperidine Shortness Of Breath, Nausea Only and Other (See Comments)    Stomach pain    Other Nausea Only, Swelling, Rash, Anaphylaxis, Diarrhea, Shortness Of Breath and Other (See Comments)    Allergen: NON-STEROIDS, bextra - hands and feet swell  Reaction:  Stomach pain   Other Reaction: Intolerance   Prednisone Anaphylaxis and Other (See Comments)    (facial swelling/redness/burning)Increases pts blood sugar  Pt states that she is allergic to all steroids.     Shellfish Allergy Anaphylaxis, Shortness Of Breath, Diarrhea, Nausea Only and Rash    Stomach pain    Sulfa Antibiotics Shortness Of Breath, Diarrhea, Nausea Only and Other (See Comments)    Reaction:  Stomach pain   Reaction:  Stomach pain  Other Reaction: Intolerance   Sulfacetamide Sodium Diarrhea, Nausea Only, Other (See Comments) and Shortness Of Breath    Reaction:  Stomach pain    Telbivudine Other (See Comments)    Unknown reaction   Uloric [Febuxostat] Anaphylaxis    Locks pt's body up    Aspirin Rash and Other (See Comments)    Stomach pain (tolerates lower doses)   Celecoxib Other (See Comments)    GI bleed, weakness, and stomach pain.     Cephalexin Diarrhea, Nausea Only and Other (See Comments)    stomach pain    Codeine Diarrhea, Nausea Only and Other (See Comments)    Reaction:  Stomach pain Pt tolerates morphine     Dilaudid [Hydromorphone Hcl] Other (See Comments)    Reactions: easy to overdose - blood pressure drops really low   Erythromycin Diarrhea, Nausea Only and Other (See Comments)    Reaction:  Stomach pain    Iodinated Contrast Media Rash and Other (See Comments)    Pt states that she is unable to have because she has chronic kidney disease.   CKD Pt states that she is unable to have because she has chronic kidney disease.   CKD    Lipitor [Atorvastatin Calcium] Nausea Only    Reaction: nausea, weakness, pass blood   Oxycodone Other (See Comments)    Reaction:  Stomach pain    Aleve [Naproxen]     Reaction: severe stomach pain   Atorvastatin Nausea Only and Other (See Comments)    Weakness    Doxycycline     Stomach pain    Iodine Rash   Motrin [Ibuprofen]     Reaction: severe stomach pain   Tape Other (See Comments)    Causes pts skin to tear  Pt states that she is able to use paper tape.       Valdecoxib Swelling and Other (See Comments)    Pt states that her hands and feet swell.      Patient Measurements: Height: 5\' 3"  (160 cm) Weight: 70.1 kg (154 lb 8.7 oz) IBW/kg (Calculated) : 52.4 Heparin Dosing Weight: 66.9 kg  Vital Signs: Temp: 98.4 F (36.9 C) (05/12  2000) Temp Source: Oral (05/12 2000) BP: 112/49 (05/13 0445) Pulse Rate: 66 (05/13 0445)  Labs: Recent Labs    01/20/23 1045 01/21/23 0420 01/21/23 1239 01/21/23 1503 01/22/23 0246 01/22/23 0813 01/22/23 0845 01/22/23 1132 01/22/23 1400 01/22/23 1943 01/22/23 2135 01/23/23 0410  HGB  --    < > 10.3*   < > 9.3*  --    < >  --  8.4*  --  7.8* 7.6*  HCT  --    < > 32.5*   < > 29.8*  --    < >  --  26.8*  --  23.9* 23.4*  PLT  --    < > 172  --  143*  --   --   --   --   --   --  102*  APTT 31  --   --   --   --   --   --   --   --  85*  --  >200*  LABPROT 15.1  --  16.4*  --   --   --   --   --   --   --   --   --   INR 1.2  --  1.3*  --   --   --   --   --   --   --   --   --   HEPARINUNFRC  --    --   --   --   --   --   --   --   --   --   --  >1.10*  CREATININE  --    < > 2.48*  --  2.68*  --   --   --   --   --   --  2.01*  CKTOTAL  --   --  268*  --   --   --   --   --   --   --   --   --   TROPONINIHS  --   --  45*   < >  --  101*  --  90* 88*  --   --   --    < > = values in this interval not displayed.     Estimated Creatinine Clearance: 20.3 mL/min (A) (by C-G formula based on SCr of 2.01 mg/dL (H)).   Medical History: Past Medical History:  Diagnosis Date   Acid reflux    Acute respiratory failure (HCC) 04/09/2018   Anemia    Anxiety    Bowel obstruction (HCC)    Carotid artery disease (HCC)    Chronic heart failure with preserved ejection fraction (HFpEF) (HCC)    a. 03/2018 Echo: EF 60-65%, mildly dil LA/RA, mod dil RV, mod TR, PASP ; b. 01/2023 Echo: EF 60-65%, no rwma, low-nl RV fxn, RVSP 51.87mmHg. Mild MR. Mod-sev TR.   CKD (chronic kidney disease), stage IV (HCC)    stage IV   Closed fracture of one rib of right side 05/29/2020   Complete tear of left rotator cuff 10/16/2014   Coronary artery disease (non-obstructive)    a. 08/2016 Cath: LM 25, LAD 20p, LCX 15p, RCA nl, EF 55%, 2+ MR; 10/2017 MV: No isch/infarct, EF 55-65%.   Depression    Diabetes mellitus without complication (HCC)    Diabetic neuropathy (HCC)    Diarrhea 03/04/2022   Fatty liver    Gastritis without bleeding    GI bleed  Gout    Hypertension    Hypothyroidism    Low back pain    history of kyphoplasty t12-l1   Morbid obesity (HCC)    Multiple gastric polyps    history of gi bleed.  polyps removed   Osteoarthritis    Osteoporosis    PAF (paroxysmal atrial fibrillation) (HCC) 2016   a. On amio/eliquis (CHA2DS2VASc = 9).   Paroxysmal atrial fibrillation (HCC)    Presence of permanent cardiac pacemaker    Pulmonary hypertension (HCC)    a. 03/2018 Echo: PASP .   Sinus node dysfunction (HCC) 08/24/2017   Sinus node dysfunction (HCC)    a. MDT- upgraded to dual  chamber device Dec 2018- lead revision post upgrade dec 2018 (MDT [id number] Azure XT DR MRI).   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   Uncontrolled type 2 diabetes mellitus with hypoglycemia, with long-term current use of insulin (HCC) 03/04/2022    Medications:  Scheduled:   allopurinol  100 mg Oral Daily   Chlorhexidine Gluconate Cloth  6 each Topical Daily   cyanocobalamin  1,000 mcg Oral Daily   docusate sodium  100 mg Oral BID   folic acid  1 mg Intravenous Daily   gabapentin  100 mg Oral BID   hydrocortisone sod succinate (SOLU-CORTEF) inj  100 mg Intravenous Q12H   insulin aspart  0-15 Units Subcutaneous Q4H   levothyroxine  37.5 mcg Oral QAC breakfast   midodrine  5 mg Oral TID WC   oxybutynin  5 mg Oral TID   rOPINIRole  0.25 mg Oral QHS   sertraline  25 mg Oral Daily   sertraline  50 mg Oral Daily   thiamine  100 mg Oral Daily    Assessment: 82 yo female who [facility] ER on 05/10 via EMS from [facility] with right hip pain after tripping and having a witness fall. She denied hitting her head or LOC. Pt does take eliquis for atrial fibrillation last dose 05/10 am per Med Rec.   Goal of Therapy:  Heparin level 0.3-0.5 units/ml aPTT 66 - 85s seconds Monitor platelets by anticoagulation protocol: Yes   05/12 1943 aPTT 85s, therapeutic x 1 05/13 0441 aPTT > 200s, HL > 1.10   Plan:  5/13:  aPTT = > 200 s ,  HL = > 1.10 -  aPTT greatly increased from previous reading which was therapeutic.  Sample was drawn from central line by RN so may have been in error. - will order STAT repeat aPTT and HL to be drawn by phlebotomist.  -- avoid bolus doses based on MD order/request ---we will use aPTT to guide therapy until anti-Xa and aPTT correlate ---Continue to monitor H&H and platelets  Britzy Graul D, PharmD 01/23/2023,6:14 AM

## 2023-01-23 NOTE — Inpatient Diabetes Management (Signed)
Inpatient Diabetes Program Recommendations  AACE/ADA: New Consensus Statement on Inpatient Glycemic Control (2015)  Target Ranges:  Prepandial:   less than 140 mg/dL      Peak postprandial:   less than 180 mg/dL (1-2 hours)      Critically ill patients:  140 - 180 mg/dL   Lab Results  Component Value Date   GLUCAP 183 (H) 01/23/2023   HGBA1C 6.8 (H) 01/21/2023    Review of Glycemic Control  Latest Reference Range & Units 01/22/23 19:33 01/22/23 23:43 01/23/23 04:05 01/23/23 07:42 01/23/23 12:26  Glucose-Capillary 70 - 99 mg/dL 161 (H) 096 (H) 045 (H) 166 (H) 183 (H)   Diabetes history: DM 2 Outpatient Diabetes medications:  Humalog 0-12 units tid with meals Lantus 16 units q HS Current orders for Inpatient glycemic control:  Novolog 0-15 units q 4 hours Solucortef 100 mg bid Inpatient Diabetes Program Recommendations:    Consider adding Semglee 10 units daily while patient is hospitalized.   Thanks,  Beryl Meager, RN, BC-ADM Inpatient Diabetes Coordinator Pager [phone]  (8a-5p)

## 2023-01-23 NOTE — Progress Notes (Signed)
Rounding Note    Patient Name: Deborah Obrien Date of Encounter: 01/23/2023  [facility] Cardiologist: Sherryl Manges, MD   Subjective   She denies chest pain or shortness of breath.  Hypotension improved but she continues to require a small dose of norepinephrine drip.  Inpatient Medications    Scheduled Meds:  allopurinol  100 mg Oral Daily   Chlorhexidine Gluconate Cloth  6 each Topical Daily   cyanocobalamin  1,000 mcg Oral Daily   docusate sodium  100 mg Oral BID   folic acid  1 mg Intravenous Daily   gabapentin  100 mg Oral BID   hydrocortisone sod succinate (SOLU-CORTEF) inj  100 mg Intravenous Q12H   insulin aspart  0-15 Units Subcutaneous Q4H   levothyroxine  37.5 mcg Oral QAC breakfast   midodrine  5 mg Oral TID WC   oxybutynin  5 mg Oral TID   rOPINIRole  0.25 mg Oral QHS   sertraline  25 mg Oral Daily   sertraline  50 mg Oral Daily   thiamine  100 mg Oral Daily   Continuous Infusions:  sodium chloride 10 mL/hr at 01/23/23 0806   famotidine (PEPCID) IV Stopped (01/22/23 2038)   heparin 1,100 Units/hr (01/23/23 0806)   lactated ringers     methocarbamol (ROBAXIN) IV Stopped (01/21/23 2137)   norepinephrine (LEVOPHED) Adult infusion 4 mcg/min (01/23/23 0806)   piperacillin-tazobactam (ZOSYN)  IV Stopped (01/23/23 0612)   vasopressin Stopped (01/22/23 1936)   PRN Meds: sodium chloride, acetaminophen, bisacodyl, fentaNYL (SUBLIMAZE) injection, methocarbamol **OR** methocarbamol (ROBAXIN) IV, metoCLOPramide **OR** metoCLOPramide (REGLAN) injection, ondansetron **OR** ondansetron (ZOFRAN) IV, polyvinyl alcohol, senna-docusate, sodium phosphate, traMADol, traZODone   Vital Signs    Vitals:   01/23/23 0745 01/23/23 0806 01/23/23 0815 01/23/23 0830  BP: (!) 119/46 (!) 118/51 105/66 (!) 118/52  Pulse: 66 64 60 61  Resp: 17 17 16 14   Temp:  98.2 F (36.8 C)    TempSrc:  Oral    SpO2: 99% 100% 100% 99%  Weight:      Height:        Intake/Output  Summary (Last 24 hours) at 01/23/2023 0854 Last data filed at 01/23/2023 0806 Gross per 24 hour  Intake 1645.44 ml  Output 1535 ml  Net 110.44 ml      01/21/2023    9:00 PM 01/20/2023    1:10 PM 01/03/2023    9:41 AM  Last 3 Weights  Weight (lbs) 154 lb 8.7 oz 158 lb 11.7 oz 151 lb  Weight (kg) 70.1 kg 72 kg 68.493 kg      Telemetry    Atrial paced rhythm with heart rate mostly in the 60s.- Personally Reviewed  ECG     - Personally Reviewed  Physical Exam   GEN: No acute distress.   Neck: No JVD Cardiac: RRR, no murmurs, rubs, or gallops.  Respiratory: Clear to auscultation bilaterally. GI: Soft, nontender, non-distended  MS: No edema; No deformity. Neuro:  Nonfocal  Psych: Normal affect   Labs    High Sensitivity Troponin:   Recent Labs  Lab 01/21/23 1503 01/21/23 1712 01/22/23 0813 01/22/23 1132 01/22/23 1400  TROPONINIHS 52* 61* 101* 90* 88*     Chemistry Recent Labs  Lab 01/21/23 0420 01/21/23 1239 01/22/23 0246 01/23/23 0410  NA 139 135 138 136  K 5.0 4.5 4.3 4.2  CL 107 108 107 104  CO2 25 19* 23 19*  GLUCOSE 274* 284* 239* 216*  BUN 44* 48* 51*  50*  CREATININE 2.36* 2.48* 2.68* 2.01*  CALCIUM 8.3* 7.5* 8.0* 8.0*  MG  --  1.7 2.2 2.2  PROT 6.5 5.8* 6.1*  --   ALBUMIN 3.5 3.2* 3.3*  --   AST 98* 84* 72*  --   ALT 61* 58* 40  --   ALKPHOS 156* 133* 119  --   BILITOT 1.5* 1.4* 0.7  --   GFRNONAA 20* 19* 17* 24*  ANIONGAP 7 8 8 13     Lipids No results for input(s): "CHOL", "TRIG", "HDL", "LABVLDL", "LDLCALC", "CHOLHDL" in the last 168 hours.  Hematology Recent Labs  Lab 01/21/23 1239 01/21/23 1936 01/22/23 0246 01/22/23 0845 01/22/23 2135 01/23/23 0410 01/23/23 0630  WBC 14.4*  --  16.1*  --   --  10.0  --   RBC 3.20*  --  2.87*  --   --  2.38*  --   HGB 10.3*   < > 9.3*   < > 7.8* 7.6* 7.8*  HCT 32.5*   < > 29.8*   < > 23.9* 23.4* 24.1*  MCV 101.6*  --  103.8*  --   --  98.3  --   MCH 32.2  --  32.4  --   --  31.9  --   MCHC 31.7   --  31.2  --   --  32.5  --   RDW 13.8  --  13.8  --   --  13.8  --   PLT 172  --  143*  --   --  102*  --    < > = values in this interval not displayed.   Thyroid  Recent Labs  Lab 01/21/23 1239  TSH 4.492  FREET4 1.03    BNP Recent Labs  Lab 01/21/23 1239  BNP 1,487.0*    DDimer  Recent Labs  Lab 01/21/23 1239  DDIMER 2.78*     Radiology    ECHOCARDIOGRAM COMPLETE  Result Date: 01/22/2023    ECHOCARDIOGRAM REPORT   Patient Name:   Deborah Obrien Date of Exam: 01/22/2023 Medical Rec #:  [id number]         Height:       63.0 in Accession #:    [id number]        Weight:       154.5 lb Date of Birth:  [birth year]        BSA:          1.733 m Patient Age:    81 years          BP:           121/42 mmHg Patient Gender: F                 HR:           61 bpm. Exam Location:  [facility] Procedure: 2D Echo, Cardiac Doppler and Color Doppler Indications:     Pulmonary hypertension I27.2  History:         Patient has prior history of Echocardiogram examinations, most                  recent 03/14/2018. Pacemaker, Arrythmias:LBBB; Risk                  Factors:Hypertension and Diabetes.  Sonographer:     Cristela Blue Referring Phys:  [id number] Ezequiel Essex Diagnosing Phys: Julien Nordmann MD IMPRESSIONS  1. Left ventricular ejection fraction, by estimation, is 60 to 65%. Left ventricular  ejection fraction by PLAX is 63 %. The left ventricle has normal function. The left ventricle has no regional wall motion abnormalities. Left ventricular diastolic parameters are indeterminate.  2. Right ventricular systolic function is low normal. The right ventricular size is mildly enlarged. There is moderately elevated pulmonary artery systolic pressure. The estimated right ventricular systolic pressure is 51.2 mmHg.  3. The mitral valve is normal in structure. Mild mitral valve regurgitation. No evidence of mitral stenosis.  4. Tricuspid valve regurgitation is moderate to severe.  5. The aortic valve is tricuspid.  Aortic valve regurgitation is not visualized. No aortic stenosis is present.  6. The inferior vena cava is normal in size with greater than 50% respiratory variability, suggesting right atrial pressure of 3 mmHg. FINDINGS  Left Ventricle: Left ventricular ejection fraction, by estimation, is 60 to 65%. Left ventricular ejection fraction by PLAX is 63 %. The left ventricle has normal function. The left ventricle has no regional wall motion abnormalities. The left ventricular internal cavity size was normal in size. There is no left ventricular hypertrophy. Left ventricular diastolic parameters are indeterminate. Right Ventricle: The right ventricular size is mildly enlarged. No increase in right ventricular wall thickness. Right ventricular systolic function is low normal. There is moderately elevated pulmonary artery systolic pressure. The tricuspid regurgitant  velocity is 3.40 m/s, and with an assumed right atrial pressure of 5 mmHg, the estimated right ventricular systolic pressure is 51.2 mmHg. Left Atrium: Left atrial size was normal in size. Right Atrium: Right atrial size was normal in size. Pericardium: There is no evidence of pericardial effusion. Mitral Valve: The mitral valve is normal in structure. There is mild calcification of the mitral valve leaflet(s). Mild mitral valve regurgitation. No evidence of mitral valve stenosis. MV peak gradient, 8.9 mmHg. The mean mitral valve gradient is 4.0 mmHg. Tricuspid Valve: The tricuspid valve is normal in structure. Tricuspid valve regurgitation is moderate to severe. No evidence of tricuspid stenosis. Aortic Valve: The aortic valve is tricuspid. Aortic valve regurgitation is not visualized. No aortic stenosis is present. Aortic valve mean gradient measures 3.0 mmHg. Aortic valve peak gradient measures 5.7 mmHg. Aortic valve area, by VTI measures 2.89 cm. Pulmonic Valve: The pulmonic valve was normal in structure. Pulmonic valve regurgitation is not visualized.  No evidence of pulmonic stenosis. Aorta: The aortic root is normal in size and structure. Venous: The inferior vena cava is normal in size with greater than 50% respiratory variability, suggesting right atrial pressure of 3 mmHg. IAS/Shunts: No atrial level shunt detected by color flow Doppler. Additional Comments: A device lead is visualized.  LEFT VENTRICLE PLAX 2D LV EF:         Left            Diastology                ventricular     LV e' medial:    18.90 cm/s                ejection        LV E/e' medial:  6.5                fraction by     LV e' lateral:   15.90 cm/s                PLAX is 63      LV E/e' lateral: 7.7                %.  LVIDd:         4.70 cm LVIDs:         3.10 cm LV PW:         0.90 cm LV IVS:        0.90 cm LVOT diam:     2.00 cm LV SV:         55 LV SV Index:   32 LVOT Area:     3.14 cm  RIGHT VENTRICLE RV Basal diam:  3.80 cm RV Mid diam:    3.60 cm RV S prime:     19.80 cm/s TAPSE (M-mode): 2.6 cm LEFT ATRIUM           Index        RIGHT ATRIUM           Index LA diam:      4.20 cm 2.42 cm/m   RA Area:     24.30 cm LA Vol (A2C): 53.1 ml 30.64 ml/m  RA Volume:   82.80 ml  47.78 ml/m LA Vol (A4C): 53.1 ml 30.64 ml/m  AORTIC VALVE AV Area (Vmax):    2.28 cm AV Area (Vmean):   2.17 cm AV Area (VTI):     2.89 cm AV Vmax:           119.00 cm/s AV Vmean:          84.800 cm/s AV VTI:            0.190 m AV Peak Grad:      5.7 mmHg AV Mean Grad:      3.0 mmHg LVOT Vmax:         86.20 cm/s LVOT Vmean:        58.500 cm/s LVOT VTI:          0.175 m LVOT/AV VTI ratio: 0.92  AORTA Ao Root diam: 2.70 cm MITRAL VALVE                TRICUSPID VALVE MV Area (PHT): 3.10 cm     TR Peak grad:   46.2 mmHg MV Area VTI:   1.45 cm     TR Vmax:        340.00 cm/s MV Peak grad:  8.9 mmHg MV Mean grad:  4.0 mmHg     SHUNTS MV Vmax:       1.49 m/s     Systemic VTI:  0.18 m MV Vmean:      91.4 cm/s    Systemic Diam: 2.00 cm MV Decel Time: 245 msec MV E velocity: 122.00 cm/s MV A velocity: 88.00 cm/s MV E/A  ratio:  1.39 Julien Nordmann MD Electronically signed by Julien Nordmann MD Signature Date/Time: 01/22/2023/9:16:37 AM    Final (Updated)    US Abdomen Limited RUQ (LIVER/GB)  Result Date: 01/21/2023 CLINICAL DATA:  Elevated LFTs EXAM: ULTRASOUND ABDOMEN LIMITED RIGHT UPPER QUADRANT COMPARISON:  03/04/2022 FINDINGS: Gallbladder: Surgically removed Common bile duct: Diameter: 6.4 mm. Liver: Increased echogenicity without focal mass likely related to fatty infiltration. Portal vein is patent on color Doppler imaging with normal direction of blood flow towards the liver. Other: None. IMPRESSION: Fatty liver. Status post cholecystectomy. Electronically Signed   By: Alcide Clever M.D.   On: 01/21/2023 20:22   DG Chest Port 1 View  Result Date: 01/21/2023 CLINICAL DATA:  Check central line placement EXAM: PORTABLE CHEST 1 VIEW COMPARISON:  01/20/23 FINDINGS: Cardiac shadow is stable. Pacing device is again seen. New right jugular central line is seen  with the catheter tip in the distal superior vena cava. No pneumothorax is seen. The lungs are clear. Changes of prior vertebral augmentation are noted in the lower thoracic spine. IMPRESSION: No pneumothorax following central line placement. Electronically Signed   By: Alcide Clever M.D.   On: 01/21/2023 20:17   DG HIP UNILAT WITH PELVIS 2-3 VIEWS LEFT  Result Date: 01/21/2023 CLINICAL DATA:  Right hip fracture repair EXAM: DG HIP (WITH OR WITHOUT PELVIS) 2-3V LEFT COMPARISON:  None Available. FINDINGS: Seven fluoroscopic images were obtained during right hip fracture repair. By the end of the study, gamma nails and an intramedullary femoral rod extend across the fracture site with a distal interlocking screw. No other abnormalities. IMPRESSION: Right hip fracture repair as above. Electronically Signed   By: Gerome Sam III M.D.   On: 01/21/2023 12:15   DG C-Arm 1-60 Min-No Report  Result Date: 01/21/2023 Fluoroscopy was utilized by the requesting physician.   No radiographic interpretation.   DG C-Arm 1-60 Min-No Report  Result Date: 01/21/2023 Fluoroscopy was utilized by the requesting physician.  No radiographic interpretation.    Cardiac Studies   Echocardiogram was done on May 12 and was personally reviewed by me.  It showed normal LV systolic function with mildly enlarged right ventricle, moderate pulmonary hypertension, mild mitral regurgitation and moderate tricuspid regurgitation.  Patient Profile     82 y.o. 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  We are consulted for hypotension.  Assessment & Plan    1.  Hypotension: Likely multifactorial including blood loss and autonomic dysfunction.  Her blood pressure seems to be improved today.  I agree with midodrine and trying to wean off norepinephrine drip.  Transfuse as needed to maintain hemoglobin above 8.  I do not see a clear cardiac involvement as her LV systolic function was normal.  She does have moderate pulmonary hypertension but that has been present on prior echocardiograms.  I doubt pulmonary embolism as an etiology.  2.  Pulmonary hypertension: Present on previous echocardiogram and this does not seem to be new.  This has been moderate and stable.  No need for diuresis as the patient does not appear to be volume overloaded.  3.  Paroxysmal atrial fibrillation: Recommend resuming amiodarone as there are no plans to use Paxlovid.  The patient is at high risk for recurrent atrial fibrillation without an antiarrhythmic medication.  She was on anticoagulation with Eliquis 2.5 mg twice daily as an outpatient.  Recommend resuming this as long as no contraindication from a surgical standpoint.  4.  Blood loss anemia:  Continue to monitor and consider transfusion to maintain hemoglobin above 8 to see if that helps with hypertension.  5.  COVID infection: She does not appear to be significantly symptomatic from this.       For questions or updates, please contact [facility] Please consult [email or web address] for contact info under        Signed, Lorine Bears, MD  01/23/2023, 8:54 AM

## 2023-01-23 NOTE — Progress Notes (Signed)
[facility]  ROUNDING NOTE   Subjective:     Objective:  Vital signs in last 24 hours:  Temp:  [97.8 F (36.6 C)-98.6 F (37 C)] 97.8 F (36.6 C) (05/13 1600) Pulse Rate:  [48-105] 63 (05/13 1615) Resp:  [11-32] 24 (05/13 1615) BP: (84-141)/(34-104) 104/46 (05/13 1615) SpO2:  [90 %-100 %] 98 % (05/13 1615) FiO2 (%):  [28 %] 28 % (05/12 2242)  Weight change:  Filed Weights   01/20/23 1310 01/21/23 2100  Weight: 72 kg 70.1 kg    Intake/Output: I/O last 3 completed shifts: In: 2713.3 [I.V.:940.5; IV Piggyback:1772.9] Out: 2260 [Urine:2260]   Intake/Output this shift:  Total I/O In: 1486.6 [I.V.:146.4; Blood:340; IV Piggyback:1000.3] Out: 575 [Urine:575]  Physical Exam: General: NAD,   Head: Normocephalic, atraumatic. Moist oral mucosal membranes  Eyes: Anicteric, PERRL  Neck: Supple, trachea midline  Lungs:  Clear to auscultation  Heart: Regular rate and rhythm  Abdomen:  Soft, nontender,   Extremities:  no peripheral edema.  Neurologic: Nonfocal, moving all four extremities  Skin: No lesions  Access: none    Basic Metabolic Panel: Recent Labs  Lab 01/20/23 1022 01/21/23 0420 01/21/23 1239 01/22/23 0246 01/23/23 0410  NA 136 139 135 138 136  K 4.7 5.0 4.5 4.3 4.2  CL 102 107 108 107 104  CO2 21* 25 19* 23 19*  GLUCOSE 173* 274* 284* 239* 216*  BUN 42* 44* 48* 51* 50*  CREATININE 1.83* 2.36* 2.48* 2.68* 2.01*  CALCIUM 9.1 8.3* 7.5* 8.0* 8.0*  MG  --   --  1.7 2.2 2.2  PHOS  --   --  5.6* 6.3* 3.5    Liver Function Tests: Recent Labs  Lab 01/20/23 1022 01/21/23 0420 01/21/23 1239 01/22/23 0246  AST 72* 98* 84* 72*  ALT 37 61* 58* 40  ALKPHOS 151* 156* 133* 119  BILITOT 1.7* 1.5* 1.4* 0.7  PROT 7.3 6.5 5.8* 6.1*  ALBUMIN 4.1 3.5 3.2* 3.3*   No results for input(s): "LIPASE", "AMYLASE" in the last 168 hours. No results for input(s): "AMMONIA" in the last 168 hours.  CBC: Recent Labs  Lab 01/20/23 1022 01/21/23 0420  01/21/23 1239 01/21/23 1936 01/22/23 0246 01/22/23 0845 01/22/23 2135 01/23/23 0408 01/23/23 0410 01/23/23 0630 01/23/23 0936  WBC 5.6 14.2* 14.4*  --  16.1*  --   --  9.5 10.0  --   --   NEUTROABS 3.5  --  13.4*  --  13.0*  --   --  8.0*  --   --   --   HGB 13.0 11.6* 10.3*   < > 9.3*   < > 7.8* 7.8* 7.6* 7.8* 7.1*  HCT 39.2 37.3 32.5*   < > 29.8*   < > 23.9* 23.9* 23.4* 24.1* 22.1*  MCV 97.8 103.0* 101.6*  --  103.8*  --   --  99.2 98.3  --   --   PLT 160 152 172  --  143*  --   --  119* 102*  --   --    < > = values in this interval not displayed.    Cardiac Enzymes: Recent Labs  Lab 01/21/23 1239  CKTOTAL 268*    BNP: Invalid input(s): "POCBNP"  CBG: Recent Labs  Lab 01/22/23 2343 01/23/23 0405 01/23/23 0742 01/23/23 1226 01/23/23 1554  GLUCAP 183* 206* 166* 183* 212*    Microbiology: Results for orders placed or performed during the hospital encounter of 01/20/23  MRSA Next Gen by  PCR, Nasal     Status: None   Collection Time: 01/20/23 11:00 PM   Specimen: Nasal Mucosa; Nasal Swab  Result Value Ref Range Status   MRSA by PCR Next Gen NOT DETECTED NOT DETECTED Final    Comment: (NOTE) The GeneXpert MRSA Assay (FDA approved for NASAL specimens only), is one component of a comprehensive MRSA colonization surveillance program. It is not intended to diagnose MRSA infection nor to guide or monitor treatment for MRSA infections. Test performance is not FDA approved in patients less than 42 years old. Performed at [facility], [address] [place], [place] [address]   Culture, blood (Routine X 2) w Reflex to ID Panel     Status: None (Preliminary result)   Collection Time: 01/21/23  1:37 PM   Specimen: BLOOD  Result Value Ref Range Status   Specimen Description BLOOD BLOOD RIGHT HAND  Final   Special Requests   Final    BOTTLES DRAWN AEROBIC AND ANAEROBIC Blood Culture results may not be optimal due to an inadequate volume of blood received in  culture bottles   Culture   Final    NO GROWTH 2 DAYS Performed at [facility], [address], [place], [place] [address]    Report Status PENDING  Incomplete  Culture, blood (Routine X 2) w Reflex to ID Panel     Status: None (Preliminary result)   Collection Time: 01/21/23  3:03 PM   Specimen: BLOOD RIGHT HAND  Result Value Ref Range Status   Specimen Description BLOOD RIGHT HAND  Final   Special Requests   Final    BOTTLES DRAWN AEROBIC ONLY Blood Culture results may not be optimal due to an inadequate volume of blood received in culture bottles   Culture   Final    NO GROWTH 2 DAYS Performed at [facility], [address] [place], [place] [address]    Report Status PENDING  Incomplete  MRSA Next Gen by PCR, Nasal     Status: None   Collection Time: 01/21/23  6:30 PM   Specimen: Nasal Mucosa; Nasal Swab  Result Value Ref Range Status   MRSA by PCR Next Gen NOT DETECTED NOT DETECTED Final    Comment: (NOTE) The GeneXpert MRSA Assay (FDA approved for NASAL specimens only), is one component of a comprehensive MRSA colonization surveillance program. It is not intended to diagnose MRSA infection nor to guide or monitor treatment for MRSA infections. Test performance is not FDA approved in patients less than 60 years old. Performed at [facility], [address] [place], [place] [address]   SARS Coronavirus 2 by RT PCR (hospital order, performed in [facility] hospital lab) *cepheid single result test* Anterior Nasal Swab     Status: Abnormal   Collection Time: 01/22/23  8:13 AM   Specimen: Anterior Nasal Swab  Result Value Ref Range Status   SARS Coronavirus 2 by RT PCR POSITIVE (A) NEGATIVE Final    Comment: (NOTE) SARS-CoV-2 target nucleic acids are DETECTED  SARS-CoV-2 RNA is generally detectable in upper respiratory specimens  during the acute phase of infection.  Positive results are indicative  of the presence of the identified virus,  but do not rule out bacterial infection or co-infection with other pathogens not detected by the test.  Clinical correlation with patient history and  other diagnostic information is necessary to determine patient infection status.  The expected result is negative.  Fact Sheet for Patients:   RoadLapTop.co.za  Fact Sheet for Healthcare Providers:   http://kim-miller.com/    This test is not yet approved or cleared by the [place] FDA and  has been authorized for detection and/or diagnosis of SARS-CoV-2 by FDA under an Emergency Use Authorization (EUA).  This EUA will remain in effect (meaning this test can be used) for the duration of  the COVID-19 declaration under Section 564(b)(1)  of the Act, 21 U.S.C. section 360-bbb-3(b)(1), unless the authorization is terminated or revoked sooner.   Performed at [facility], [address] [place], [place] [address]     Coagulation Studies: Recent Labs    01/21/23 1239 01/23/23 0936  LABPROT 16.4* 16.7*  INR 1.3* 1.3*    Urinalysis: Recent Labs    01/22/23 0246  COLORURINE YELLOW  LABSPEC >1.030*  PHURINE 5.5  GLUCOSEU NEGATIVE  HGBUR TRACE*  BILIRUBINUR SMALL*  KETONESUR TRACE*  PROTEINUR 30*  NITRITE NEGATIVE  LEUKOCYTESUR NEGATIVE      Imaging: ECHOCARDIOGRAM COMPLETE  Result Date: 01/22/2023    ECHOCARDIOGRAM REPORT   Patient Name:   MAKENSY VIANDS Date of Exam: 01/22/2023 Medical Rec #:  [id number]         Height:       63.0 in Accession #:    [id number]        Weight:       154.5 lb Date of Birth:  February 24, 1941        BSA:          1.733 m Patient Age:    81 years          BP:           121/42 mmHg Patient Gender: F                 HR:           61 bpm. Exam Location:  [facility] Procedure: 2D Echo, Cardiac Doppler and Color Doppler Indications:     Pulmonary hypertension I27.2  History:         Patient has prior history of Echocardiogram examinations, most                   recent 03/14/2018. Pacemaker, Arrythmias:LBBB; Risk                  Factors:Hypertension and Diabetes.  Sonographer:     Cristela Blue Referring Phys:  [id number] Ezequiel Essex Diagnosing Phys: Julien Nordmann MD IMPRESSIONS  1. Left ventricular ejection fraction, by estimation, is 60 to 65%. Left ventricular ejection fraction by PLAX is 63 %. The left ventricle has normal function. The left ventricle has no regional wall motion abnormalities. Left ventricular diastolic parameters are indeterminate.  2. Right ventricular systolic function is low normal. The right ventricular size is mildly enlarged. There is moderately elevated pulmonary artery systolic pressure. The estimated right ventricular systolic pressure is 51.2 mmHg.  3. The mitral valve is normal in structure. Mild mitral valve regurgitation. No evidence of mitral stenosis.  4. Tricuspid valve regurgitation is moderate to severe.  5. The aortic valve is tricuspid. Aortic valve regurgitation is not visualized. No aortic stenosis is present.  6. The inferior vena cava is normal in size with greater than 50% respiratory variability, suggesting right atrial pressure of 3 mmHg. FINDINGS  Left Ventricle: Left ventricular ejection fraction, by estimation, is 60 to 65%. Left ventricular ejection fraction by PLAX is 63 %. The left ventricle has normal function. The left ventricle has no regional  wall motion abnormalities. The left ventricular internal cavity size was normal in size. There is no left ventricular hypertrophy. Left ventricular diastolic parameters are indeterminate. Right Ventricle: The right ventricular size is mildly enlarged. No increase in right ventricular wall thickness. Right ventricular systolic function is low normal. There is moderately elevated pulmonary artery systolic pressure. The tricuspid regurgitant  velocity is 3.40 m/s, and with an assumed right atrial pressure of 5 mmHg, the estimated right ventricular systolic pressure is  51.2 mmHg. Left Atrium: Left atrial size was normal in size. Right Atrium: Right atrial size was normal in size. Pericardium: There is no evidence of pericardial effusion. Mitral Valve: The mitral valve is normal in structure. There is mild calcification of the mitral valve leaflet(s). Mild mitral valve regurgitation. No evidence of mitral valve stenosis. MV peak gradient, 8.9 mmHg. The mean mitral valve gradient is 4.0 mmHg. Tricuspid Valve: The tricuspid valve is normal in structure. Tricuspid valve regurgitation is moderate to severe. No evidence of tricuspid stenosis. Aortic Valve: The aortic valve is tricuspid. Aortic valve regurgitation is not visualized. No aortic stenosis is present. Aortic valve mean gradient measures 3.0 mmHg. Aortic valve peak gradient measures 5.7 mmHg. Aortic valve area, by VTI measures 2.89 cm. Pulmonic Valve: The pulmonic valve was normal in structure. Pulmonic valve regurgitation is not visualized. No evidence of pulmonic stenosis. Aorta: The aortic root is normal in size and structure. Venous: The inferior vena cava is normal in size with greater than 50% respiratory variability, suggesting right atrial pressure of 3 mmHg. IAS/Shunts: No atrial level shunt detected by color flow Doppler. Additional Comments: A device lead is visualized.  LEFT VENTRICLE PLAX 2D LV EF:         Left            Diastology                ventricular     LV e' medial:    18.90 cm/s                ejection        LV E/e' medial:  6.5                fraction by     LV e' lateral:   15.90 cm/s                PLAX is 63      LV E/e' lateral: 7.7                %. LVIDd:         4.70 cm LVIDs:         3.10 cm LV PW:         0.90 cm LV IVS:        0.90 cm LVOT diam:     2.00 cm LV SV:         55 LV SV Index:   32 LVOT Area:     3.14 cm  RIGHT VENTRICLE RV Basal diam:  3.80 cm RV Mid diam:    3.60 cm RV S prime:     19.80 cm/s TAPSE (M-mode): 2.6 cm LEFT ATRIUM           Index        RIGHT ATRIUM            Index LA diam:      4.20 cm 2.42 cm/m   RA Area:     24.30 cm LA  Vol (A2C): 53.1 ml 30.64 ml/m  RA Volume:   82.80 ml  47.78 ml/m LA Vol (A4C): 53.1 ml 30.64 ml/m  AORTIC VALVE AV Area (Vmax):    2.28 cm AV Area (Vmean):   2.17 cm AV Area (VTI):     2.89 cm AV Vmax:           119.00 cm/s AV Vmean:          84.800 cm/s AV VTI:            0.190 m AV Peak Grad:      5.7 mmHg AV Mean Grad:      3.0 mmHg LVOT Vmax:         86.20 cm/s LVOT Vmean:        58.500 cm/s LVOT VTI:          0.175 m LVOT/AV VTI ratio: 0.92  AORTA Ao Root diam: 2.70 cm MITRAL VALVE                TRICUSPID VALVE MV Area (PHT): 3.10 cm     TR Peak grad:   46.2 mmHg MV Area VTI:   1.45 cm     TR Vmax:        340.00 cm/s MV Peak grad:  8.9 mmHg MV Mean grad:  4.0 mmHg     SHUNTS MV Vmax:       1.49 m/s     Systemic VTI:  0.18 m MV Vmean:      91.4 cm/s    Systemic Diam: 2.00 cm MV Decel Time: 245 msec MV E velocity: 122.00 cm/s MV A velocity: 88.00 cm/s MV E/A ratio:  1.39 Julien Nordmann MD Electronically signed by Julien Nordmann MD Signature Date/Time: 01/22/2023/9:16:37 AM    Final (Updated)    US Abdomen Limited RUQ (LIVER/GB)  Result Date: 01/21/2023 CLINICAL DATA:  Elevated LFTs EXAM: ULTRASOUND ABDOMEN LIMITED RIGHT UPPER QUADRANT COMPARISON:  03/04/2022 FINDINGS: Gallbladder: Surgically removed Common bile duct: Diameter: 6.4 mm. Liver: Increased echogenicity without focal mass likely related to fatty infiltration. Portal vein is patent on color Doppler imaging with normal direction of blood flow towards the liver. Other: None. IMPRESSION: Fatty liver. Status post cholecystectomy. Electronically Signed   By: Alcide Clever M.D.   On: 01/21/2023 20:22     Medications:    sodium chloride 10 mL/hr at 01/23/23 1500   methocarbamol (ROBAXIN) IV Stopped (01/21/23 2137)   norepinephrine (LEVOPHED) Adult infusion 2 mcg/min (01/23/23 1500)    allopurinol  100 mg Oral Daily   amiodarone  200 mg Oral BID   apixaban  2.5 mg Oral BID    Chlorhexidine Gluconate Cloth  6 each Topical Daily   cyanocobalamin  1,000 mcg Oral Daily   docusate sodium  100 mg Oral BID   famotidine  20 mg Oral QHS   feeding supplement  237 mL Oral BID BM   [START ON 01/24/2023] folic acid  1 mg Oral Daily   gabapentin  100 mg Oral BID   hydrocortisone sod succinate (SOLU-CORTEF) inj  100 mg Intravenous Q12H   insulin aspart  0-15 Units Subcutaneous Q4H   levothyroxine  37.5 mcg Oral QAC breakfast   midodrine  10 mg Oral TID WC   [START ON 01/24/2023] multivitamin with minerals  1 tablet Oral Daily   rOPINIRole  0.25 mg Oral QHS   sertraline  25 mg Oral Daily   sertraline  50 mg Oral Daily   thiamine  100 mg Oral  Daily   sodium chloride, acetaminophen, bisacodyl, fentaNYL (SUBLIMAZE) injection, methocarbamol **OR** methocarbamol (ROBAXIN) IV, metoCLOPramide **OR** metoCLOPramide (REGLAN) injection, ondansetron **OR** ondansetron (ZOFRAN) IV, polyvinyl alcohol, senna-docusate, sodium phosphate, traMADol, traZODone  Assessment/ Plan:  [name] is a 82 y.o. white female with hypertension, atrial fibrillation, congestive heart failure, diabetes, peripheral vascular disease, hyperlipidemia and anemia who is admitted to [facility] on 01/20/2023 for Hip fracture (HCC) [S72.009A] Fall, initial encounter [W19.XXXA] Closed fracture of right hip, initial encounter (HCC) [S72.001A]  Also with COVID-19.    #1: Acute kidney injury on chronic kidney disease: Patient is chronic kidney disease stage IV with a GFR of about 25 and a creatinine of 2.3.  Acute kidney injury is most likely secondary to acute tubular necrosis secondary to hypotension and septic shock.  Will continue the gentle hydration as needed.  No acute need for dialysis.  Urine output is between 400 to 600 cc per shift. Follows with [employer] [facility]. Previously had seen Dr. Thedore Mins.    #2: Sepsis/hypotension: weaning off pressors. Now on midodrine and stress dose steroids.    #3: Diabetes  mellitus type II with chronic kidney disease: continue glucose control.    #4: Right hip fracture: S/p repair.   #5: Anemia with chronic kidney disease: hemoglobin 7.1. Low threshold for ESA    LOS: 3 Deborah Obrien 5/13/20245:05 PM

## 2023-01-23 NOTE — Progress Notes (Signed)
Palliative Care Progress Note, Assessment & Plan   Patient Name: Deborah Obrien       Date: 01/23/2023 DOB: 1940/10/18  Age: 82 y.o. MRN#: [id number] Attending Physician: Vida Rigger, MD Primary Care Physician: Melonie Florida, FNP Admit Date: 01/20/2023  Subjective: Patient is sitting up in bed and eating her breakfast.  She acknowledges my presence and is able to make her wishes known.  No family or friends present during my visit.  HPI: 82 y.o. female  with past medical history of paroxsymal atrial fibrillation on Eliquis, sinus node dysfunction s/p pacemaker palcement, HFpEF, nonobstructive CAD, pulmonary HTN, CKD stage IV, T2DM, TIA, anxiety, depression, RLS and hypothyroidism admitted from [facility] on 01/20/2023 following a mechanical fall resulting in right hip fracture.   Underwent IM nailing of right femur with cephalomedullary device on 5/11 Post op complications including hypotension requiring vasopressor support, acute hypercapnic respiratory failure requiring bipap therapy and encephalopathy    Incidentally found to be COVID (+) 5/12   Concern due to worsening renal function with underlying CKD stage IV   Concern for possible PE or fat embolus-unable to obtain CTA due to renal function   Palliative medicine was consulted for assisting with goals of care conversations.  Summary of counseling/coordination of care: After reviewing the patient's chart and assessing the patient at bedside, I spoke with patient in regards to symptom management and plan of care.  Patient endorses she did not rest well last night and is hoping to rest some today.  She shares she tolerated the BiPAP as long as possible but had to take it off earlier than expected.  We discussed importance of use of  BiPAP for respiratory support in addition to rest to optimize recovery.  Outlined importance of improving functional status as it is an important indicator of patient's overall prognosis.  Patient states she is willing to work with therapies but is just tired right now.  Symptoms assessed.  Patient denies pain, discomfort, or other issues at this time.  No adjustments to medications needed currently.  As per chart review, family wishes for goals to remain full code and full scope.  Should patient declined/deteriorate then family is open to further goals of care discussions with PMT.  PMT will continue to monitor the patient.  Physical Exam Constitutional:      General: She is not in acute distress.    Appearance: She is normal weight.  HENT:     Head: Normocephalic.     Mouth/Throat:     Mouth: Mucous membranes are moist.  Eyes:     Pupils: Pupils are equal, round, and reactive to light.  Cardiovascular:     Rate and Rhythm: Normal rate.  Pulmonary:     Effort: Pulmonary effort is normal.  Abdominal:     Palpations: Abdomen is soft.  Skin:    General: Skin is warm and dry.  Neurological:     Mental Status: She is alert.  Psychiatric:        Mood and Affect: Mood normal.        Behavior: Behavior normal.        Thought Content: Thought content normal.  Total Time 25 minutes   Meena Barrantes L. Ilsa Iha, FNP-BC Palliative Medicine Team Team Phone # [phone]

## 2023-01-23 NOTE — Progress Notes (Signed)
NAME:  Deborah Obrien, MRN:  [id number], DOB:  [birth year], LOS: 3 ADMISSION DATE:  01/20/2023, CONSULTATION DATE: 01/21/2023 REFERRING MD: Dr. Laural Benes, CHIEF COMPLAINT: Hypotension    History of Present Illness:  This is an 82 yo female who presented to [facility] ER on 05/10 via EMS from [facility] with right hip pain after tripping and having a witness fall.  She denied hitting her head or LOC.  Pt does take eliquis for atrial fibrillation.  At baseline she ambulates with a walker.    ED Course  Upon arrival to the ER pt hypotensive with maps in the low 60's.  Right hip x-ray revealed comminuted intertrochanteric right hip fracture, with impaction and varus angulation at the fracture site.  Orthopedic Surgeon Dr. Allena Katz consulted, and pt admitted to the [facility] unit per hospitalist team pending right hip cephalomedullary nailing scheduled for 05/11.     01/23/23-patient s/p evaluation by cardio/renal/ortho.  Remains on levophed.  Decision to start eliquis low dose 2.5 per cardio/ortho today.  Monitoring H/h will transfuse 1 unit today.  Mentation seems to be improving some with periods of confusion.  PT/OT after transfusion.  Taking PO well have increased her midodrine to 10 tid.     Pertinent  Medical History  Paroxysmal Atrial Fibrillation on eliquis  GERD  Anemia  Anxiety Bowel Obstruction  CAD  Stage IV CKD Depression  Type II Diabetes Mellitus Gastritis  GI Bleed  HTN  Hypothyroidism  Chronic Back Pain  Morbid Obesity  Permanent Cardiac Pacermaker  Sinus Node Dysfunction  Stroke  TIA   Significant Hospital Events: Including procedures, antibiotic start and stop dates in addition to other pertinent events   05/10: Pt admitted to the [facility] unit with a right intertrochanteric hip fracture following a mechanical fall  05/11: Pt underwent intramedullary nailing of the right femur with cephalomedullary device. Postop pt hypotensive requiring levophed gtt. Pt also  developed acute hypercapnic respiratory failure requiring Bipap. PCCM team consulted to assist with management 05/12: No acute events overnight.  Pt pending Cardiology and Nephrology consults  01/23/23- no distress this am, remains hypotensive of 7mcg/kg/min levophed support.  Plan to rehydrate with more IVF today and wean off vasopressors. COVID is mild and stable on room air, will dc remdesevir  Interim History / Subjective:  Pt transitioned off Bipap this am tolerating 2L O2 via nasal canula.  Remains on levophed gtt @21  mcg/min to maintain map 60 or higher will add vasopressin.    Objective   Blood pressure (!) 118/52, pulse 61, temperature 98.2 F (36.8 C), temperature source Oral, resp. rate 14, height 5\' 3"  (1.6 m), weight 70.1 kg, SpO2 99 %. CVP:  [8 mmHg-60 mmHg] 23 mmHg  FiO2 (%):  [28 %] 28 %   Intake/Output Summary (Last 24 hours) at 01/23/2023 0846 Last data filed at 01/23/2023 4098 Gross per 24 hour  Intake 1645.44 ml  Output 1535 ml  Net 110.44 ml    Filed Weights   01/20/23 1310 01/21/23 2100  Weight: 72 kg 70.1 kg    Examination: General: Acute on chronically-ill appearing female, NAD on 2L O2  HENT: Supple, no JVD  Lungs: Clear throughout, even, non labored  Cardiovascular: Atrial paced, hr 70, no m/r/g, 2+ radial/1+ distal pulses, trace right hip edema  Abdomen: +BS x4, soft, non tender, non distended  Extremities: Right hip fracture post repair, normal bulk  Skin: Right hip incision with honeycomb dressing dry/intact  Neuro: Slightly lethargic, oriented, following commands, PERRLA  GU: Indwelling foley catheter draining clear yellow urine   Resolved Hospital Problem list    Assessment & Plan:  #Acute metabolic encephalopathy~improving   - Correct metabolic derangements - Avoid sedating medication when able  - Frequent reorientation   #Shock: cardiogenic and hypovolemic; possible adrenal insufficiency  #HFpEF #Pulmonary HTN  Hx: Paroxysmal atrial  fibrillation, permanent cardiac pacemaker, CAD, sinus node dysfunction  - Continuous telemetry monitoring  - Trend troponin's until peaked  - Echo pending   - TSH 4.492; free T4 1.03 - CVP 10-12 will hold on additional iv fluids for now BNP 1,487.0 - Continue scheduled midodrine and prn levophed/vasopressin gtts to maintain map >65 - Cardiology consulted appreciate input - Concern for possible PE or fat embolism unable to obtain CTA PE due to acute on chronic renal failure will DC HEPARIN AND START ELIQUIS PER CARDIO RECOMMENDATIONS  #Acute hypercapnic respiratory failure- RESOLVED  - Prn Bipap for hypercapnia and/or hypoxia  - Prn supplemental O2 for dyspnea and/or hypoxia  - Maintain O2 sats >92% - Follow CXR and ABG's  - Will r/o COVID  - Aggressive pulmonary hygiene when able   #Acute kidney injury superimposed on CKD stage IV~worsening  - Trend BMP  - Replace electrolytes as indicated  - Strict intake and output  - Avoid nephrotoxic medications  - Nephrology consulted appreciate- REVIWED PLAN WITH DR Wynelle Link  #Anemia no obvious signs of active bleeding  - Trend CBC  - Folic acid and vitamin B12 levels within normal limits  - Fibrinogen/PT/INR within normal limits  - D-dimer 2.78 - VTE px: heparin gtt  - Monitor for s/sx of bleeding  - Transfuse for hgb <8  #Transaminitis likely secondary to hypotension~improving  #Chronically elevated alk phosphatase~resolved   [place] RUQ Limited 01/21/23: fatty liver and s/p cholecystectomy  - Trend hepatic function panel  - Avoid hepatoxic medications as able   #Leukocytosis no obvious signs of infection  - Trend WBC and monitor fever curve  - Trend PCT  - Follow cultures  - Will start empiric zosyn due to elevated pct pending culture results   #Right intertrochanteric hip fracture following a mechanical fall s/p intramedullary nail intertrochanteric  #Acute pain  - Orthopedic consulted appreciate input  - 12.5 mg fentanyl x1 dose  pending bedside swallowing evaluation if pt passes will continue prn oxycodone  - PT/OT once hypotension resolves  #GERD  Hx Gastritis  - IV pepcid   #Type II Diabetes Mellitus  - CBG's q4hrs  - Changed from sensitive to moderate SSI   Best Practice (right click and "Reselect all SmartList Selections" daily)   Diet/type: NPO pending bedside swallow evaluation  DVT prophylaxis: SCD GI prophylaxis: H2B Lines: Yes and still needed  Foley: Yes and still needed  Code Status:  full code Last date of multidisciplinary goals of care discussion [01/22/2023]  05/12: Pt updated at bedside regarding current plan of care.  Will also update pts son Greydi Stegner via telephone today   Labs   CBC: Recent Labs  Lab 01/20/23 1022 01/21/23 0420 01/21/23 1239 01/21/23 1936 01/22/23 0246 01/22/23 0845 01/22/23 1400 01/22/23 2135 01/23/23 0410 01/23/23 0630  WBC 5.6 14.2* 14.4*  --  16.1*  --   --   --  10.0  --   NEUTROABS 3.5  --  13.4*  --  13.0*  --   --   --   --   --   HGB 13.0 11.6* 10.3*   < > 9.3* 8.9* 8.4* 7.8* 7.6*  7.8*  HCT 39.2 37.3 32.5*   < > 29.8* 28.1* 26.8* 23.9* 23.4* 24.1*  MCV 97.8 103.0* 101.6*  --  103.8*  --   --   --  98.3  --   PLT 160 152 172  --  143*  --   --   --  102*  --    < > = values in this interval not displayed.     Basic Metabolic Panel: Recent Labs  Lab 01/20/23 1022 01/21/23 0420 01/21/23 1239 01/22/23 0246 01/23/23 0410  NA 136 139 135 138 136  K 4.7 5.0 4.5 4.3 4.2  CL 102 107 108 107 104  CO2 21* 25 19* 23 19*  GLUCOSE 173* 274* 284* 239* 216*  BUN 42* 44* 48* 51* 50*  CREATININE 1.83* 2.36* 2.48* 2.68* 2.01*  CALCIUM 9.1 8.3* 7.5* 8.0* 8.0*  MG  --   --  1.7 2.2 2.2  PHOS  --   --  5.6* 6.3* 3.5    GFR: Estimated Creatinine Clearance: 20.3 mL/min (A) (by C-G formula based on SCr of 2.01 mg/dL (H)). Recent Labs  Lab 01/21/23 0420 01/21/23 1239 01/21/23 1712 01/22/23 0246 01/22/23 0813 01/23/23 0410  PROCALCITON  --   2.12  --   --  2.40  --   WBC 14.2* 14.4*  --  16.1*  --  10.0  LATICACIDVEN  --  1.5 1.3  --  1.3  --      Liver Function Tests: Recent Labs  Lab 01/20/23 1022 01/21/23 0420 01/21/23 1239 01/22/23 0246  AST 72* 98* 84* 72*  ALT 37 61* 58* 40  ALKPHOS 151* 156* 133* 119  BILITOT 1.7* 1.5* 1.4* 0.7  PROT 7.3 6.5 5.8* 6.1*  ALBUMIN 4.1 3.5 3.2* 3.3*    No results for input(s): "LIPASE", "AMYLASE" in the last 168 hours. No results for input(s): "AMMONIA" in the last 168 hours.  ABG    Component Value Date/Time   PHART 7.34 (L) 01/22/2023 1400   PCO2ART 36 01/22/2023 1400   PO2ART 102 01/22/2023 1400   HCO3 19.4 (L) 01/22/2023 1400   TCO2 24 07/15/2020 0921   ACIDBASEDEF 5.7 (H) 01/22/2023 1400   O2SAT 98.8 01/22/2023 1400     Coagulation Profile: Recent Labs  Lab 01/20/23 1045 01/21/23 1239  INR 1.2 1.3*     Cardiac Enzymes: Recent Labs  Lab 01/21/23 1239  CKTOTAL 268*     HbA1C: Hemoglobin A1C  Date/Time Value Ref Range Status  09/26/2012 12:00 PM 7.5 (H) 4.2 - 6.3 % Final    Comment:    The American Diabetes Association recommends that a primary goal of therapy should be <7% and that physicians should reevaluate the treatment regimen in patients with HbA1c values consistently >8%.    Hgb A1c MFr Bld  Date/Time Value Ref Range Status  01/21/2023 04:20 AM 6.8 (H) 4.8 - 5.6 % Final    Comment:    (NOTE) Pre diabetes:          5.7%-6.4%  Diabetes:              >6.4%  Glycemic control for   <7.0% adults with diabetes   05/29/2022 10:29 PM 6.2 (H) 4.8 - 5.6 % Final    Comment:    (NOTE) Pre diabetes:          5.7%-6.4%  Diabetes:              >6.4%  Glycemic control for   <7.0% adults  with diabetes     CBG: Recent Labs  Lab 01/22/23 1549 01/22/23 1933 01/22/23 2343 01/23/23 0405 01/23/23 0742  GLUCAP 233* 170* 183* 206* 166*     Review of Systems: Positives in BOLD   Gen: Denies fever, chills, weight change, fatigue, night  sweats HEENT: Denies blurred vision, double vision, hearing loss, tinnitus, sinus congestion, rhinorrhea, sore throat, neck stiffness, dysphagia PULM: Denies shortness of breath, cough, sputum production, hemoptysis, wheezing CV: Denies chest pain, edema, orthopnea, paroxysmal nocturnal dyspnea, palpitations GI: Denies abdominal pain, nausea, vomiting, diarrhea, hematochezia, melena, constipation, change in bowel habits GU: Denies dysuria, hematuria, polyuria, oliguria, urethral discharge MUSC: postop right hip pain Endocrine: Denies hot or cold intolerance, polyuria, polyphagia or appetite change Derm: Denies rash, dry skin, scaling or peeling skin change Heme: Denies easy bruising, bleeding, bleeding gums Neuro: Denies headache, numbness, weakness, slurred speech, loss of memory or consciousness  Past Medical History:  She,  has a past medical history of Acid reflux, Acute respiratory failure (HCC) (04/09/2018), Anemia, Anxiety, Bowel obstruction (HCC), Carotid artery disease (HCC), Chronic heart failure with preserved ejection fraction (HFpEF) (HCC), CKD (chronic kidney disease), stage IV (HCC), Closed fracture of one rib of right side (05/29/2020), Complete tear of left rotator cuff (10/16/2014), Coronary artery disease (non-obstructive), Depression, Diabetes mellitus without complication (HCC), Diabetic neuropathy (HCC), Diarrhea (03/04/2022), Fatty liver, Gastritis without bleeding, GI bleed, Gout, Hypertension, Hypothyroidism, Low back pain, Morbid obesity (HCC), Multiple gastric polyps, Osteoarthritis, Osteoporosis, PAF (paroxysmal atrial fibrillation) (HCC) (2016), Paroxysmal atrial fibrillation (HCC), Presence of permanent cardiac pacemaker, Pulmonary hypertension (HCC), Sinus node dysfunction (HCC) (08/24/2017), Sinus node dysfunction (HCC), Tendinitis of upper biceps tendon of left shoulder (11/10/2019), TIA (transient ischemic attack), 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   Surgical History:   Past Surgical History:  Procedure Laterality Date   ABDOMINAL HYSTERECTOMY  1980   APPENDECTOMY     BACK SURGERY     2008. plate & screws    BACK SURGERY  2019   patient describes kyphoplasty for compression fractures, MD referral said fusion   BREAST BIOPSY Right 08/16   stereo fibroadenomatous change, neg for atypia   BREAST EXCISIONAL BIOPSY Left    neg   CARDIAC CATHETERIZATION Left 08/25/2016   Procedure: Left Heart Cath and Coronary Angiography;  Surgeon: Lamar Blinks, MD;  Location: [facility] INVASIVE CV LAB;  Service: Cardiovascular;  Laterality: Left;   CARDIOVERSION N/A 06/28/2017   Procedure: CARDIOVERSION;  Surgeon: Lamar Blinks, MD;  Location: [facility] ORS;  Service: Cardiovascular;  Laterality: N/A;   CARDIOVERSION N/A 02/06/2018   Procedure: CARDIOVERSION;  Surgeon: Wendall Stade, MD;  Location: [facility] ENDOSCOPY;  Service: Cardiovascular;  Laterality: N/A;   CARDIOVERSION N/A 07/15/2020   Procedure: CARDIOVERSION;  Surgeon: Jake Bathe, MD;  Location: [facility] ENDOSCOPY;  Service: Cardiovascular;  Laterality: N/A;   CATARACT EXTRACTION, BILATERAL  2012   CHOLECYSTECTOMY     colon blockage  1999   COLON SURGERY  2000   removed 20% of colon. colon had collapsed   COLONOSCOPY  2016   polyps removed 2016   DORSAL COMPARTMENT RELEASE Left 09/14/2016   Procedure: RELEASE DORSAL COMPARTMENT (DEQUERVAIN);  Surgeon: Donato Heinz, MD;  Location: [facility] ORS;  Service: Orthopedics;  Laterality: Left;   ESOPHAGOGASTRODUODENOSCOPY N/A 01/27/2015   Procedure: ESOPHAGOGASTRODUODENOSCOPY (EGD);  Surgeon: Christena Deem, MD;  Location: [facility] ENDOSCOPY;  Service: Endoscopy;  Laterality: N/A;   ESOPHAGOGASTRODUODENOSCOPY (EGD) WITH PROPOFOL N/A 07/24/2015   Procedure: ESOPHAGOGASTRODUODENOSCOPY (EGD) WITH PROPOFOL;   Surgeon: Midge Minium, MD;  Location: [facility] ENDOSCOPY;  Service: Endoscopy;  Laterality: N/A;   ESOPHAGOGASTRODUODENOSCOPY (EGD) WITH PROPOFOL N/A 04/12/2018   Procedure: ESOPHAGOGASTRODUODENOSCOPY (EGD) WITH PROPOFOL;  Surgeon: Wyline Mood, MD;  Location: [facility] ENDOSCOPY;  Service: Gastroenterology;  Laterality: N/A;   ESOPHAGOGASTRODUODENOSCOPY (EGD) WITH PROPOFOL N/A 06/22/2018   Procedure: ESOPHAGOGASTRODUODENOSCOPY (EGD) WITH PROPOFOL;  Surgeon: Rachael Fee, MD;  Location: [facility] ENDOSCOPY;  Service: Endoscopy;  Laterality: N/A;   ESOPHAGOGASTRODUODENOSCOPY (EGD) WITH PROPOFOL N/A 07/09/2018   Procedure: ESOPHAGOGASTRODUODENOSCOPY (EGD) WITH PROPOFOL with resection of gastric polyps;  Surgeon: Wyline Mood, MD;  Location: [facility] ENDOSCOPY;  Service: Gastroenterology;  Laterality: N/A;   ESOPHAGOGASTRODUODENOSCOPY (EGD) WITH PROPOFOL N/A 05/17/2019   Procedure: ESOPHAGOGASTRODUODENOSCOPY (EGD) WITH PROPOFOL;  Surgeon: Wyline Mood, MD;  Location: [facility] ENDOSCOPY;  Service: Gastroenterology;  Laterality: N/A;   EUS N/A 06/22/2018   Procedure: UPPER ENDOSCOPIC ULTRASOUND (EUS) RADIAL;  Surgeon: Rachael Fee, MD;  Location: [facility] ENDOSCOPY;  Service: Endoscopy;  Laterality: N/A;   EYE SURGERY Bilateral    cataract extractions   HUMERUS IM NAIL Left 06/02/2022   Procedure: INTRAMEDULLARY (IM) NAIL HUMERAL;  Surgeon: Christena Flake, MD;  Location: [facility] ORS;  Service: Orthopedics;  Laterality: Left;   JOINT REPLACEMENT Bilateral 2014   Bilateral Knee replacement   LEAD REVISION/REPAIR N/A 08/29/2017   Procedure: LEAD REVISION/REPAIR;  Surgeon: Regan Lemming, MD;  Location: [facility] INVASIVE CV LAB;  Service: Cardiovascular;  Laterality: N/A;   OOPHORECTOMY     PACEMAKER INSERTION Left 07/01/2015   Procedure: INSERTION PACEMAKER;  Surgeon: Marcina Millard, MD;  Location: [facility] ORS;  Service: Cardiovascular;  Laterality: Left;   PACEMAKER REVISION N/A 08/28/2017   Procedure: PACEMAKER REVISION;  Surgeon:  Duke Salvia, MD;  Location: [facility] INVASIVE CV LAB;  Service: Cardiovascular;  Laterality: N/A;   REPLACEMENT TOTAL KNEE BILATERAL     SHOULDER ARTHROSCOPY WITH ROTATOR CUFF REPAIR AND OPEN BICEPS TENODESIS Left 11/21/2019   Procedure: LEFT SHOULDER ARTHROSCOPY WITH DEBRIDEMENT, DECOMPRESSION, ROTATOR CUFF REPAIR AND BICEPS TENOLYSIS;  Surgeon: Christena Flake, MD;  Location: [facility] ORS;  Service: Orthopedics;  Laterality: Left;   SHOULDER ARTHROSCOPY WITH SUBACROMIAL DECOMPRESSION Left 2013   TEE WITHOUT CARDIOVERSION N/A 06/28/2017   Procedure: TRANSESOPHAGEAL ECHOCARDIOGRAM (TEE);  Surgeon: Lamar Blinks, MD;  Location: [facility] ORS;  Service: Cardiovascular;  Laterality: N/A;     Social History:   reports that she has never smoked. She has never used smokeless tobacco. She reports that she does not drink alcohol and does not use drugs.   Family History:  Her family history includes CAD in her mother; Cancer in her father; Diabetes Mellitus II in her brother and mother; Heart attack in her mother; Stroke in her brother. There is no history of Breast cancer.   Allergies Allergies  Allergen Reactions   Cephalosporins Other (See Comments), Diarrhea and Nausea Only    Reaction:  Unknown  Other reaction(s): Other (see comments) Other reaction(s): Other (See Comments) Other Reaction: Intolerance  Reaction:  Stomach pain  Other Reaction: Intolerance Reaction:  Unknown    Fish Allergy Shortness Of Breath    All kinds of fish. Severe vomitting even with smell of fish.    Macrolides And Ketolides Other (See Comments)    Severe stomach pain (mycins)   Meperidine Shortness Of Breath, Nausea Only and Other (See Comments)    Stomach pain  Other Nausea Only, Swelling, Rash, Anaphylaxis, Diarrhea, Shortness Of Breath and Other (See Comments)    Allergen: NON-STEROIDS, bextra - hands and feet swell  Reaction:  Stomach pain   Other Reaction: Intolerance   Prednisone Anaphylaxis and Other (See  Comments)    (facial swelling/redness/burning)Increases pts blood sugar  Pt states that she is allergic to all steroids.     Shellfish Allergy Anaphylaxis, Shortness Of Breath, Diarrhea, Nausea Only and Rash    Stomach pain    Sulfa Antibiotics Shortness Of Breath, Diarrhea, Nausea Only and Other (See Comments)    Reaction:  Stomach pain   Reaction:  Stomach pain  Other Reaction: Intolerance   Sulfacetamide Sodium Diarrhea, Nausea Only, Other (See Comments) and Shortness Of Breath    Reaction:  Stomach pain    Telbivudine Other (See Comments)    Unknown reaction   Uloric [Febuxostat] Anaphylaxis    Locks pt's body up    Aspirin Rash and Other (See Comments)    Stomach pain (tolerates lower doses)   Celecoxib Other (See Comments)    GI bleed, weakness, and stomach pain.     Cephalexin Diarrhea, Nausea Only and Other (See Comments)    stomach pain    Codeine Diarrhea, Nausea Only and Other (See Comments)    Reaction:  Stomach pain Pt tolerates morphine    Dilaudid [Hydromorphone Hcl] Other (See Comments)    Reactions: easy to overdose - blood pressure drops really low   Erythromycin Diarrhea, Nausea Only and Other (See Comments)    Reaction:  Stomach pain    Iodinated Contrast Media Rash and Other (See Comments)    Pt states that she is unable to have because she has chronic kidney disease.   CKD Pt states that she is unable to have because she has chronic kidney disease.   CKD    Lipitor [Atorvastatin Calcium] Nausea Only    Reaction: nausea, weakness, pass blood   Oxycodone Other (See Comments)    Reaction:  Stomach pain    Aleve [Naproxen]     Reaction: severe stomach pain   Atorvastatin Nausea Only and Other (See Comments)    Weakness    Doxycycline     Stomach pain    Iodine Rash   Motrin [Ibuprofen]     Reaction: severe stomach pain   Tape Other (See Comments)    Causes pts skin to tear  Pt states that she is able to use paper tape.       Valdecoxib Swelling  and Other (See Comments)    Pt states that her hands and feet swell.       Home Medications  Prior to Admission medications   Medication Sig Start Date End Date Taking? Authorizing Provider  acetaminophen (TYLENOL) 325 MG tablet Take 2 tablets (650 mg total) by mouth every 6 (six) hours as needed for mild pain, moderate pain or fever (or Fever >/= 101). Patient taking differently: Take 325 mg by mouth every 6 (six) hours as needed for mild pain, moderate pain or fever (or Fever >/= 101). 06/08/22  Yes Lurene Shadow, MD  acetaminophen (TYLENOL) 500 MG tablet Take 500 mg by mouth in the morning, at noon, and at bedtime.   Yes [provider]  albuterol (VENTOLIN HFA) 108 (90 Base) MCG/ACT inhaler Inhale 2 puffs into the lungs every 6 (six) hours as needed for wheezing or shortness of breath. Ventolin   Yes [provider]  allopurinol (ZYLOPRIM) 100 MG tablet Take 100  mg by mouth daily.   Yes [provider]  amiodarone (PACERONE) 200 MG tablet TAKE 1 TABLET BY MOUTH ONCE DAILY . APPOINTMENT REQUIRED FOR FUTURE REFILLS 02/14/22  Yes Duke Salvia, MD  apixaban (ELIQUIS) 2.5 MG TABS tablet Take 1 tablet (2.5 mg total) by mouth 2 (two) times daily. 03/06/22  Yes Sunnie Nielsen, DO  ciprofloxacin (CIPRO) 500 MG tablet Take 500 mg by mouth daily. 05/07 - 05/14   Yes [provider]  docusate sodium (COLACE) 100 MG capsule Take 100 mg by mouth 2 (two) times daily.   Yes [provider]  famotidine (PEPCID) 10 MG tablet Take 10 mg by mouth daily.   Yes [provider]  Fluticasone Furoate (FLONASE SENSIMIST NA) Place 1 spray into the nose daily.   Yes [provider]  furosemide (LASIX) 40 MG tablet Take 40 mg by mouth daily.   Yes [provider]  gabapentin (NEURONTIN) 100 MG capsule Take 100 mg by mouth 2 (two) times daily. 05/07 - 05/11   Yes [provider]  HUMALOG 100 UNIT/ML injection Inject 0-12 Units into the  skin 3 (three) times daily with meals. Sliding scale 05/08/22  Yes [provider]  LANTUS 100 UNIT/ML injection 16 Units at bedtime. 05/10/22  Yes [provider]  Levothyroxine Sodium 37.5 MCG CAPS Take 37.5 mcg by mouth daily before breakfast. 01/05/23  Yes Duke Salvia, MD  midodrine (PROAMATINE) 5 MG tablet Take 5 mg by mouth 3 (three) times daily with meals. 01/18/23 07/17/23 Yes [provider]  oxybutynin (DITROPAN) 5 MG tablet Take 5 mg by mouth 3 (three) times daily. Breakfast & bedtime   Yes [provider]  Polyethyl Glycol-Propyl Glycol 0.4-0.3 % SOLN Place 1 drop into both eyes 2 (two) times daily as needed (dry eyes).   Yes [provider]  pravastatin (PRAVACHOL) 20 MG tablet Take 20 mg by mouth at bedtime.  07/25/18  Yes [provider]  Prenat-FeCbn-FeBisg-FA-Omega (MULTIVITAMIN/MINERALS PO) Take 1 tablet by mouth daily.   Yes [provider]  rOPINIRole (REQUIP) 0.25 MG tablet Take 1 tablet (0.25 mg total) by mouth at bedtime. 06/08/22  Yes Lurene Shadow, MD  sertraline (ZOLOFT) 25 MG tablet Take 25 mg by mouth daily.   Yes [provider]  sertraline (ZOLOFT) 50 MG tablet Take 50 mg by mouth daily.   Yes [provider]  talc powder Apply 1 application topically as needed (under the breast irritation).   Yes [provider]  traZODone (DESYREL) 50 MG tablet Take 50 mg by mouth at bedtime as needed for sleep. 06/07/18  Yes [provider]  traZODone (DESYREL) 50 MG tablet Take 25 mg by mouth 2 (two) times daily as needed for sleep. Do not give within 2 hours of night dose   Yes [provider]  traZODone (DESYREL) 50 MG tablet Take 50 mg by mouth at bedtime.   Yes [provider]  vitamin B-12 (CYANOCOBALAMIN) 1000 MCG tablet Take 1,000 mcg by mouth daily.   Yes [provider]  alum & mag hydroxide-simeth (MAALOX/MYLANTA) 200-200-20 MG/5ML suspension Take 10  mLs by mouth 3 (three) times daily as needed for indigestion or heartburn.    [provider]  senna (SENOKOT) 8.6 MG TABS tablet Take 1 tablet by mouth every Monday, Wednesday, and Friday.    [provider]  Simethicone LIQD 10 mLs by Does not apply route 3 (three) times daily as needed. 200-200-20 mg/5 mL  Patient not taking: Reported on 01/20/2023    [provider]     Critical care provider statement:   Total critical care time: 33 minutes   Performed by: Karna Christmas MD   Critical care time was exclusive of separately billable procedures and treating other patients.   Critical care was necessary to treat or prevent imminent or life-threatening deterioration.   Critical care was time spent personally by me on the following activities: development of treatment plan with patient and/or surrogate as well as nursing, discussions with consultants, evaluation of patient's response to treatment, examination of patient, obtaining history from patient or surrogate, ordering and performing treatments and interventions, ordering and review of laboratory studies, ordering and review of radiographic studies, pulse oximetry and re-evaluation of patient's condition.    [place] Rigger, M.D.  Pulmonary & Critical Care Medicine

## 2023-01-23 NOTE — Progress Notes (Signed)
Pt's son Danila Rackham updated via telephone regarding plan of care.  Also obtained consent for blood transfusions as needed.    Harlon Ditty, AGACNP-BC [facility] Prefer epic messenger for cross cover needs If after hours, please call E-link

## 2023-01-23 NOTE — Progress Notes (Signed)
  PT Cancellation Note  Patient Details Name: Deborah Obrien MRN: [id number] DOB: [birth year]   Cancelled Treatment:    Reason Eval/Treat Not Completed: Patient not medically ready. Discussed with primary MD, reports to hold off on evaluation secondary to low BP and currently remains on levophed drip. Of note, pt with + covid. Will re-attempt tomorrow.   Bevan Vu 01/23/2023, 10:48 AM Elizabeth Palau, PT, DPT, GCS [phone]

## 2023-01-23 NOTE — Progress Notes (Addendum)
ANTICOAGULATION CONSULT NOTE  Pharmacy Consult for heparin infusion Indication: pulmonary embolus  Allergies  Allergen Reactions   Cephalosporins Other (See Comments), Diarrhea and Nausea Only    Reaction:  Unknown  Other reaction(s): Other (see comments) Other reaction(s): Other (See Comments) Other Reaction: Intolerance  Reaction:  Stomach pain  Other Reaction: Intolerance Reaction:  Unknown    Fish Allergy Shortness Of Breath    All kinds of fish. Severe vomitting even with smell of fish.    Macrolides And Ketolides Other (See Comments)    Severe stomach pain (mycins)   Meperidine Shortness Of Breath, Nausea Only and Other (See Comments)    Stomach pain    Other Nausea Only, Swelling, Rash, Anaphylaxis, Diarrhea, Shortness Of Breath and Other (See Comments)    Allergen: NON-STEROIDS, bextra - hands and feet swell  Reaction:  Stomach pain   Other Reaction: Intolerance   Prednisone Anaphylaxis and Other (See Comments)    (facial swelling/redness/burning)Increases pts blood sugar  Pt states that she is allergic to all steroids.     Shellfish Allergy Anaphylaxis, Shortness Of Breath, Diarrhea, Nausea Only and Rash    Stomach pain    Sulfa Antibiotics Shortness Of Breath, Diarrhea, Nausea Only and Other (See Comments)    Reaction:  Stomach pain   Reaction:  Stomach pain  Other Reaction: Intolerance   Sulfacetamide Sodium Diarrhea, Nausea Only, Other (See Comments) and Shortness Of Breath    Reaction:  Stomach pain    Telbivudine Other (See Comments)    Unknown reaction   Uloric [Febuxostat] Anaphylaxis    Locks pt's body up    Aspirin Rash and Other (See Comments)    Stomach pain (tolerates lower doses)   Celecoxib Other (See Comments)    GI bleed, weakness, and stomach pain.     Cephalexin Diarrhea, Nausea Only and Other (See Comments)    stomach pain    Codeine Diarrhea, Nausea Only and Other (See Comments)    Reaction:  Stomach pain Pt tolerates morphine     Dilaudid [Hydromorphone Hcl] Other (See Comments)    Reactions: easy to overdose - blood pressure drops really low   Erythromycin Diarrhea, Nausea Only and Other (See Comments)    Reaction:  Stomach pain    Iodinated Contrast Media Rash and Other (See Comments)    Pt states that she is unable to have because she has chronic kidney disease.   CKD Pt states that she is unable to have because she has chronic kidney disease.   CKD    Lipitor [Atorvastatin Calcium] Nausea Only    Reaction: nausea, weakness, pass blood   Oxycodone Other (See Comments)    Reaction:  Stomach pain    Aleve [Naproxen]     Reaction: severe stomach pain   Atorvastatin Nausea Only and Other (See Comments)    Weakness    Doxycycline     Stomach pain    Iodine Rash   Motrin [Ibuprofen]     Reaction: severe stomach pain   Tape Other (See Comments)    Causes pts skin to tear  Pt states that she is able to use paper tape.       Valdecoxib Swelling and Other (See Comments)    Pt states that her hands and feet swell.      Patient Measurements: Height: 5\' 3"  (160 cm) Weight: 70.1 kg (154 lb 8.7 oz) IBW/kg (Calculated) : 52.4 Heparin Dosing Weight: 66.9 kg  Vital Signs: Temp: 98.4 F (36.9 C) (05/13  0400) Temp Source: Oral (05/13 0400) BP: 112/42 (05/13 0645) Pulse Rate: 64 (05/13 0645)  Labs: Recent Labs    01/20/23 1045 01/21/23 0420 01/21/23 1239 01/21/23 1503 01/22/23 0246 01/22/23 0813 01/22/23 0845 01/22/23 1132 01/22/23 1400 01/22/23 1943 01/22/23 2135 01/23/23 0410 01/23/23 0630  HGB  --    < > 10.3*   < > 9.3*  --    < >  --  8.4*  --  7.8* 7.6* 7.8*  HCT  --    < > 32.5*   < > 29.8*  --    < >  --  26.8*  --  23.9* 23.4* 24.1*  PLT  --    < > 172  --  143*  --   --   --   --   --   --  102*  --   APTT 31  --   --   --   --   --   --   --   --  85*  --  >200* 102*  LABPROT 15.1  --  16.4*  --   --   --   --   --   --   --   --   --   --   INR 1.2  --  1.3*  --   --   --   --   --    --   --   --   --   --   HEPARINUNFRC  --   --   --   --   --   --   --   --   --   --   --  >1.10* 1.08*  CREATININE  --    < > 2.48*  --  2.68*  --   --   --   --   --   --  2.01*  --   CKTOTAL  --   --  268*  --   --   --   --   --   --   --   --   --   --   TROPONINIHS  --   --  45*   < >  --  101*  --  90* 88*  --   --   --   --    < > = values in this interval not displayed.     Estimated Creatinine Clearance: 20.3 mL/min (A) (by C-G formula based on SCr of 2.01 mg/dL (H)).   Medical History: Past Medical History:  Diagnosis Date   Acid reflux    Acute respiratory failure (HCC) 04/09/2018   Anemia    Anxiety    Bowel obstruction (HCC)    Carotid artery disease (HCC)    Chronic heart failure with preserved ejection fraction (HFpEF) (HCC)    a. 03/2018 Echo: EF 60-65%, mildly dil LA/RA, mod dil RV, mod TR, PASP ; b. 01/2023 Echo: EF 60-65%, no rwma, low-nl RV fxn, RVSP 51.31mmHg. Mild MR. Mod-sev TR.   CKD (chronic kidney disease), stage IV (HCC)    stage IV   Closed fracture of one rib of right side 05/29/2020   Complete tear of left rotator cuff 10/16/2014   Coronary artery disease (non-obstructive)    a. 08/2016 Cath: LM 25, LAD 20p, LCX 15p, RCA nl, EF 55%, 2+ MR; 10/2017 MV: No isch/infarct, EF 55-65%.   Depression    Diabetes mellitus without complication (HCC)    Diabetic  neuropathy (HCC)    Diarrhea 03/04/2022   Fatty liver    Gastritis without bleeding    GI bleed    Gout    Hypertension    Hypothyroidism    Low back pain    history of kyphoplasty t12-l1   Morbid obesity (HCC)    Multiple gastric polyps    history of gi bleed.  polyps removed   Osteoarthritis    Osteoporosis    PAF (paroxysmal atrial fibrillation) (HCC) 2016   a. On amio/eliquis (CHA2DS2VASc = 9).   Paroxysmal atrial fibrillation (HCC)    Presence of permanent cardiac pacemaker    Pulmonary hypertension (HCC)    a. 03/2018 Echo: PASP .   Sinus node dysfunction (HCC) 08/24/2017    Sinus node dysfunction (HCC)    a. MDT- upgraded to dual chamber device Dec 2018- lead revision post upgrade dec 2018 (MDT [id number] Azure XT DR MRI).   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   Uncontrolled type 2 diabetes mellitus with hypoglycemia, with long-term current use of insulin (HCC) 03/04/2022    Medications:  Scheduled:   allopurinol  100 mg Oral Daily   Chlorhexidine Gluconate Cloth  6 each Topical Daily   cyanocobalamin  1,000 mcg Oral Daily   docusate sodium  100 mg Oral BID   folic acid  1 mg Intravenous Daily   gabapentin  100 mg Oral BID   hydrocortisone sod succinate (SOLU-CORTEF) inj  100 mg Intravenous Q12H   insulin aspart  0-15 Units Subcutaneous Q4H   levothyroxine  37.5 mcg Oral QAC breakfast   midodrine  5 mg Oral TID WC   oxybutynin  5 mg Oral TID   rOPINIRole  0.25 mg Oral QHS   sertraline  25 mg Oral Daily   sertraline  50 mg Oral Daily   thiamine  100 mg Oral Daily    Assessment: 82 yo female who [facility] ER on 05/10 via EMS from [facility] with right hip pain after tripping and having a witness fall. She denied hitting her head or LOC. Pt does take eliquis for atrial fibrillation last dose 05/10 am per Med Rec.   Goal of Therapy:  Heparin level 0.3-0.5 units/ml aPTT 66 - 85s seconds Monitor platelets by anticoagulation protocol: Yes   05/12 1943 aPTT 85s, therapeutic x 1 05/13 0441 aPTT > 200s, HL > 1.10  - heparin drip paused @ 0540 05/13 0630 aPTT 102, HL 1.08   Plan:  5/13 @ 0410:  aPTT = > 200 s ,  HL = > 1.10 -  aPTT greatly increased from previous reading which was therapeutic.  Sample was drawn from central line by RN so may have been in error. - will order STAT repeat aPTT and HL to be drawn by phlebotomist.   5/13 @ 0630: aPTT = 102 s,  HL = 1.08 - aPTT elevated,  HL elevated from PTA  Eliquis - RN paused heparin gtt @ 0540 then repeat sample was drawn @ 0630 so drip had been off for ~ 1hr.  - Will decrease heparin drip to 1100 units/hr and recheck aPTT 8 hrs after rate change  -- avoid bolus doses based on MD order/request ---we will use aPTT to guide therapy until anti-Xa and aPTT correlate ---Continue to monitor H&H and platelets  Shaya Altamura D, PharmD 01/23/2023,7:08 AM

## 2023-01-23 NOTE — Discharge Instructions (Signed)
INSTRUCTIONS AFTER Surgery  Remove items at home which could result in a fall. This includes throw rugs or furniture in walking pathways ICE to the affected joint every three hours while awake for 30 minutes at a time, for at least the first 3-5 days, and then as needed for pain and swelling.  Continue to use ice for pain and swelling. You may notice swelling that will progress down to the foot and ankle.  This is normal after surgery.  Elevate your leg when you are not up walking on it.   Continue to use the breathing machine you got in the hospital (incentive spirometer) which will help keep your temperature down.  It is common for your temperature to cycle up and down following surgery, especially at night when you are not up moving around and exerting yourself.  The breathing machine keeps your lungs expanded and your temperature down.   DIET:  As you were doing prior to hospitalization, we recommend a well-balanced diet.  DRESSING / WOUND CARE / SHOWERING  Dressing change as needed.  No showering.    ACTIVITY  Increase activity slowly as tolerated, but follow the weight bearing instructions below.   No driving for 6 weeks or until further direction given by your physician.  You cannot drive while taking narcotics.  No lifting or carrying greater than 10 lbs. until further directed by your surgeon. Avoid periods of inactivity such as sitting longer than an hour when not asleep. This helps prevent blood clots.  You may return to work once you are authorized by your doctor.     WEIGHT BEARING  WBAT to Right hip.     EXERCISES Gait training and ROM.    CONSTIPATION  Constipation is defined medically as fewer than three stools per week and severe constipation as less than one stool per week.  Even if you have a regular bowel pattern at home, your normal regimen is likely to be disrupted due to multiple reasons following surgery.  Combination of anesthesia, postoperative narcotics,  change in appetite and fluid intake all can affect your bowels.   YOU MUST use at least one of the following options; they are listed in order of increasing strength to get the job done.  They are all available over the counter, and you may need to use some, POSSIBLY even all of these options:    Drink plenty of fluids (prune juice may be helpful) and high fiber foods Colace 100 mg by mouth twice a day  Senokot for constipation as directed and as needed Dulcolax (bisacodyl), take with full glass of water  Miralax (polyethylene glycol) once or twice a day as needed.  If you have tried all these things and are unable to have a bowel movement in the first 3-4 days after surgery call either your surgeon or your primary doctor.    If you experience loose stools or diarrhea, hold the medications until you stool forms back up.  If your symptoms do not get better within 1 week or if they get worse, check with your doctor.  If you experience "the worst abdominal pain ever" or develop nausea or vomiting, please contact the office immediately for further recommendations for treatment.   ITCHING:  If you experience itching with your medications, try taking only a single pain pill, or even half a pain pill at a time.  You can also use Benadryl over the counter for itching or also to help with sleep.   TED  HOSE STOCKINGS:  Use stockings on both legs until for at least 2 weeks or as directed by physician office. They may be removed at night for sleeping.  MEDICATIONS:  See your medication summary on the "After Visit Summary" that nursing will review with you.  You may have some home medications which will be placed on hold until you complete the course of blood thinner medication.  It is important for you to complete the blood thinner medication as prescribed.  PRECAUTIONS:  If you experience chest pain or shortness of breath - call 911 immediately for transfer to the hospital emergency department.   If you  develop a fever greater that 101 F, purulent drainage from wound, increased redness or drainage from wound, foul odor from the wound/dressing, or calf pain - CONTACT YOUR SURGEON.                                                   FOLLOW-UP APPOINTMENTS:  If you do not already have a post-op appointment, please call the office for an appointment to be seen by your surgeon.  Guidelines for how soon to be seen are listed in your "After Visit Summary", but are typically between 1-4 weeks after surgery.  OTHER INSTRUCTIONS:     MAKE SURE YOU:  Understand these instructions.  Get help right away if you are not doing well or get worse.    Thank you for letting us be a part of your medical care team.  It is a privilege we respect greatly.  We hope these instructions will help you stay on track for a fast and full recovery!

## 2023-01-23 NOTE — Progress Notes (Signed)
OT Cancellation Note  Patient Details Name: Deborah Obrien MRN: [id number] DOB: 1941-08-30   Cancelled Treatment:    Reason Eval/Treat Not Completed: Patient not medically ready. Chart reviewed. Per MD, hold therapy evaluations today given continued low BP. Will re-attempt next date as medically appropriate.   Arman Filter., MPH, MS, OTR/L ascom [phone] 01/23/23, 9:49 AM

## 2023-01-24 DIAGNOSIS — N184 Chronic kidney disease, stage 4 (severe): Secondary | ICD-10-CM | POA: Diagnosis not present

## 2023-01-24 DIAGNOSIS — S72001A Fracture of unspecified part of neck of right femur, initial encounter for closed fracture: Secondary | ICD-10-CM | POA: Diagnosis not present

## 2023-01-24 DIAGNOSIS — I480 Paroxysmal atrial fibrillation: Secondary | ICD-10-CM | POA: Diagnosis not present

## 2023-01-24 DIAGNOSIS — R579 Shock, unspecified: Secondary | ICD-10-CM | POA: Diagnosis not present

## 2023-01-24 DIAGNOSIS — I5032 Chronic diastolic (congestive) heart failure: Secondary | ICD-10-CM | POA: Diagnosis not present

## 2023-01-24 DIAGNOSIS — N179 Acute kidney failure, unspecified: Secondary | ICD-10-CM | POA: Diagnosis not present

## 2023-01-24 LAB — BASIC METABOLIC PANEL
Anion gap: 6 (ref 5–15)
BUN: 43 mg/dL — ABNORMAL HIGH (ref 8–23)
CO2: 24 mmol/L (ref 22–32)
Calcium: 8.3 mg/dL — ABNORMAL LOW (ref 8.9–10.3)
Chloride: 109 mmol/L (ref 98–111)
Creatinine, Ser: 1.54 mg/dL — ABNORMAL HIGH (ref 0.44–1.00)
GFR, Estimated: 34 mL/min — ABNORMAL LOW (ref >60)
Glucose, Bld: 181 mg/dL — ABNORMAL HIGH (ref 70–99)
Potassium: 4 mmol/L (ref 3.5–5.1)
Sodium: 139 mmol/L (ref 135–145)

## 2023-01-24 LAB — PREPARE RBC (CROSSMATCH)

## 2023-01-24 LAB — HEMOGLOBIN AND HEMATOCRIT, BLOOD
HCT: 26 % — ABNORMAL LOW (ref 36.0–46.0)
HCT: 27.9 % — ABNORMAL LOW (ref 36.0–46.0)
HCT: 29.6 % — ABNORMAL LOW (ref 36.0–46.0)
Hemoglobin: 8.5 g/dL — ABNORMAL LOW (ref 12.0–15.0)
Hemoglobin: 9 g/dL — ABNORMAL LOW (ref 12.0–15.0)
Hemoglobin: 9.6 g/dL — ABNORMAL LOW (ref 12.0–15.0)

## 2023-01-24 LAB — TYPE AND SCREEN
ABO/RH(D): A POS
Antibody Screen: NEGATIVE
Unit division: 0
Unit division: 0

## 2023-01-24 LAB — GLUCOSE, CAPILLARY
Glucose-Capillary: 172 mg/dL — ABNORMAL HIGH (ref 70–99)
Glucose-Capillary: 177 mg/dL — ABNORMAL HIGH (ref 70–99)
Glucose-Capillary: 213 mg/dL — ABNORMAL HIGH (ref 70–99)
Glucose-Capillary: 216 mg/dL — ABNORMAL HIGH (ref 70–99)
Glucose-Capillary: 225 mg/dL — ABNORMAL HIGH (ref 70–99)
Glucose-Capillary: 236 mg/dL — ABNORMAL HIGH (ref 70–99)

## 2023-01-24 LAB — BPAM RBC
Blood Product Expiration Date: 202406122359
Blood Product Expiration Date: 202406122359
ISSUE DATE / TIME: 202405131238
ISSUE DATE / TIME: 202405140946
Unit Type and Rh: 6200
Unit Type and Rh: 6200

## 2023-01-24 LAB — MAGNESIUM: Magnesium: 2 mg/dL (ref 1.7–2.4)

## 2023-01-24 LAB — CBC
HCT: 26.3 % — ABNORMAL LOW (ref 36.0–46.0)
Hemoglobin: 8.4 g/dL — ABNORMAL LOW (ref 12.0–15.0)
MCH: 31.6 pg (ref 26.0–34.0)
MCHC: 31.9 g/dL (ref 30.0–36.0)
MCV: 98.9 fL (ref 80.0–100.0)
Platelets: 119 10*3/uL — ABNORMAL LOW (ref 150–400)
RBC: 2.66 MIL/uL — ABNORMAL LOW (ref 3.87–5.11)
RDW: 14.6 % (ref 11.5–15.5)
WBC: 5.7 10*3/uL (ref 4.0–10.5)
nRBC: 0.4 % — ABNORMAL HIGH (ref 0.0–0.2)

## 2023-01-24 LAB — CULTURE, BLOOD (ROUTINE X 2): Culture: NO GROWTH

## 2023-01-24 LAB — PHOSPHORUS: Phosphorus: 3.1 mg/dL (ref 2.5–4.6)

## 2023-01-24 MED ORDER — HYDROCORTISONE SOD SUC (PF) 100 MG IJ SOLR
50.0000 mg | Freq: Two times a day (BID) | INTRAMUSCULAR | Status: DC
  Administered 2023-01-25: 50 mg via INTRAVENOUS
  Filled 2023-01-24: qty 2

## 2023-01-24 MED ORDER — INSULIN GLARGINE-YFGN 100 UNIT/ML ~~LOC~~ SOLN
10.0000 [IU] | Freq: Every day | SUBCUTANEOUS | Status: DC
  Administered 2023-01-24: 10 [IU] via SUBCUTANEOUS
  Filled 2023-01-24 (×2): qty 0.1

## 2023-01-24 NOTE — Progress Notes (Signed)
Palliative Care Progress Note, Assessment & Plan   Patient Name: Deborah Obrien       Date: 01/24/2023 DOB: January 03, 1941  Age: 82 y.o. MRN#: [id number] Attending Physician: Vida Rigger, MD Primary Care Physician: Melonie Florida, FNP Admit Date: 01/20/2023  Subjective: Patient is sitting up in bed in no apparent distress.  She is sleeping but easily arousable.  She acknowledges my presence and is able to make her wishes known.  No family or friends present during my visit.  HPI: 82 y.o. female  with past medical history of paroxsymal atrial fibrillation on Eliquis, sinus node dysfunction s/p pacemaker placement, HFpEF, nonobstructive CAD, pulmonary HTN, CKD stage IV, T2DM, TIA, anxiety, depression, RLS and hypothyroidism admitted from [facility] on 01/20/2023 following a mechanical fall resulting in right hip fracture.   Underwent IM nailing of right femur with cephalomedullary device on 5/11 Post op complications including hypotension requiring vasopressor support, acute hypercapnic respiratory failure requiring bipap therapy and encephalopathy    Incidentally found to be COVID (+) 5/12   Concern due to worsening renal function with underlying CKD stage IV   Concern for possible PE or fat embolus-unable to obtain CTA due to renal function   Palliative medicine was consulted for assisting with code status and goals of care conversations.  Summary of counseling/coordination of care: After reviewing the patient's chart and assessing the patient at bedside, I spoke with patient in regards to symptom management and goals of care.  Symptoms assessed.  Patient has no acute complaints of pain, shortness of breath, nausea, vomiting, or discomfort at this time.  She denies pain or tenderness in her  hip. No adjustments to medications needed at this time.    I attempted to elicit values and goals important to the patient.  Patient becomes tearful.  Therapeutic silence, active listening, and emotional support provided.  Patient discusses discharge planning (back to  house by herself or to [facility] with her husband).  She shares she wishes to speak with all 3 of her sons before making a final decision about discharge.  I assured her TOC is following closely.  Advance care planning and CODE STATUS discussed with patient.  Patient shares her son Caryn Bee is HCPOA and should be providing paperwork to hospital soon.  As far as CODE STATUS, in the event that patient has a cardiopulmonary arrest, she confirms she would be accepting of all offered, available, and appropriate medical interventions to sustain her life.  In the event that patient is unable to protect her airway, she is accepting of a mechanical ventilatory support.    Patient also stated she would not want to live long-term on a ventilator or in a vegetative state.  She was unable to identify if she would be accepting of a trach/PEG and shares she would leave that decision up to her son.  I outlined that mechanical ventilatory support is usually for approximately 2 weeks and then airway protection is converted to a tracheostomy.  Patient states that she does not know if she would be accepting of that and wants to leave that decision up to her children.  However, she again reiterated she would never want to live  in a vegetative state.  Full code and full scope remain.  Symptom burden is low.  TOC following closely for discharge planning.  PMT will remain available to patient and family throughout her hospitalization.  Physical Exam Vitals reviewed.  Constitutional:      General: She is not in acute distress.    Appearance: She is normal weight.  HENT:     Head: Normocephalic.     Mouth/Throat:     Mouth: Mucous membranes  are moist.  Eyes:     Pupils: Pupils are equal, round, and reactive to light.  Cardiovascular:     Rate and Rhythm: Normal rate.  Pulmonary:     Effort: Pulmonary effort is normal.  Abdominal:     Palpations: Abdomen is soft.  Skin:    General: Skin is warm and dry.  Neurological:     Mental Status: She is alert and oriented to person, place, and time.  Psychiatric:        Mood and Affect: Mood normal.        Behavior: Behavior normal.        Thought Content: Thought content normal.        Judgment: Judgment normal.             Total Time 35 minutes   Varnell Donate L. Manon Hilding, FNP-BC Palliative Medicine Team Team Phone # [phone]

## 2023-01-24 NOTE — Progress Notes (Signed)
Rounding Note    Patient Name: Deborah Obrien Date of Encounter: 01/24/2023  [facility] Cardiologist: Sherryl Manges, MD   Subjective   No chest pain, shortness of breath, or edema.  No complaints at this time.  Inpatient Medications    Scheduled Meds:  allopurinol  100 mg Oral Daily   amiodarone  200 mg Oral BID   apixaban  2.5 mg Oral BID   Chlorhexidine Gluconate Cloth  6 each Topical Daily   cyanocobalamin  1,000 mcg Oral Daily   docusate sodium  100 mg Oral BID   famotidine  20 mg Oral QHS   feeding supplement  237 mL Oral BID BM   folic acid  1 mg Oral Daily   gabapentin  100 mg Oral BID   hydrocortisone sod succinate (SOLU-CORTEF) inj  100 mg Intravenous Q12H   [START ON 01/25/2023] hydrocortisone sod succinate (SOLU-CORTEF) inj  50 mg Intravenous Q12H   insulin aspart  0-20 Units Subcutaneous Q4H   levothyroxine  37.5 mcg Oral QAC breakfast   midodrine  10 mg Oral TID WC   multivitamin with minerals  1 tablet Oral Daily   rOPINIRole  0.25 mg Oral QHS   sertraline  25 mg Oral Daily   sertraline  50 mg Oral Daily   thiamine  100 mg Oral Daily   Continuous Infusions:  sodium chloride 10 mL/hr at 01/24/23 0600   methocarbamol (ROBAXIN) IV Stopped (01/23/23 1854)   norepinephrine (LEVOPHED) Adult infusion Stopped (01/23/23 1556)   PRN Meds: sodium chloride, acetaminophen, bisacodyl, methocarbamol **OR** methocarbamol (ROBAXIN) IV, metoCLOPramide **OR** metoCLOPramide (REGLAN) injection, ondansetron **OR** ondansetron (ZOFRAN) IV, polyvinyl alcohol, senna-docusate, sodium phosphate, traMADol, traZODone   Vital Signs    Vitals:   01/24/23 1230 01/24/23 1300 01/24/23 1330 01/24/23 1400  BP: (!) 119/56 127/78 (!) 109/48 (!) 116/49  Pulse: (!) 128 64 60 61  Resp: 14 11 12 17   Temp:      TempSrc:      SpO2: 97% 96% 95% 95%  Weight:      Height:        Intake/Output Summary (Last 24 hours) at 01/24/2023 1422 Last data filed at 01/24/2023 1200 Gross  per 24 hour  Intake 1683.56 ml  Output 1120 ml  Net 563.56 ml      01/24/2023    5:00 AM 01/21/2023    9:00 PM 01/20/2023    1:10 PM  Last 3 Weights  Weight (lbs) 173 lb 1 oz 154 lb 8.7 oz 158 lb 11.7 oz  Weight (kg) 78.5 kg 70.1 kg 72 kg      Telemetry    AP/VS - Personally Reviewed  ECG    No new tracing  Physical Exam   GEN: No acute distress.   Neck: No significant JVD, though right IJ TLC limits evaluation. Cardiac: RRR w/o m/r/g. Respiratory: Mildly diminished breath sounds throughout without wheezes or crackles. GI: Soft, nontender, non-distended  MS: No edema; No deformity. Neuro:  Nonfocal  Psych: Normal affect   Labs    High Sensitivity Troponin:   Recent Labs  Lab 01/21/23 1503 01/21/23 1712 01/22/23 0813 01/22/23 1132 01/22/23 1400  TROPONINIHS 52* 61* 101* 90* 88*     Chemistry Recent Labs  Lab 01/21/23 0420 01/21/23 0420 01/21/23 1239 01/22/23 0246 01/23/23 0410 01/24/23 0356  NA 139  --  135 138 136 139  K 5.0  --  4.5 4.3 4.2 4.0  CL 107  --  108 107 104  109  CO2 25  --  19* 23 19* 24  GLUCOSE 274*  --  284* 239* 216* 181*  BUN 44*  --  48* 51* 50* 43*  CREATININE 2.36*  --  2.48* 2.68* 2.01* 1.54*  CALCIUM 8.3*  --  7.5* 8.0* 8.0* 8.3*  MG  --    < > 1.7 2.2 2.2 2.0  PROT 6.5  --  5.8* 6.1*  --   --   ALBUMIN 3.5  --  3.2* 3.3*  --   --   AST 98*  --  84* 72*  --   --   ALT 61*  --  58* 40  --   --   ALKPHOS 156*  --  133* 119  --   --   BILITOT 1.5*  --  1.4* 0.7  --   --   GFRNONAA 20*  --  19* 17* 24* 34*  ANIONGAP 7  --  8 8 13 6    < > = values in this interval not displayed.    Lipids No results for input(s): "CHOL", "TRIG", "HDL", "LABVLDL", "LDLCALC", "CHOLHDL" in the last 168 hours.  Hematology Recent Labs  Lab 01/23/23 0408 01/23/23 0410 01/23/23 0630 01/24/23 0101 01/24/23 0356 01/24/23 1219  WBC 9.5 10.0  --   --  5.7  --   RBC 2.41* 2.38*  --   --  2.66*  --   HGB 7.8* 7.6*   < > 8.5* 8.4* 9.0*  HCT 23.9*  23.4*   < > 26.0* 26.3* 27.9*  MCV 99.2 98.3  --   --  98.9  --   MCH 32.4 31.9  --   --  31.6  --   MCHC 32.6 32.5  --   --  31.9  --   RDW 14.1 13.8  --   --  14.6  --   PLT 119* 102*  --   --  119*  --    < > = values in this interval not displayed.   Thyroid  Recent Labs  Lab 01/21/23 1239  TSH 4.492  FREET4 1.03    BNP Recent Labs  Lab 01/21/23 1239  BNP 1,487.0*    DDimer  Recent Labs  Lab 01/21/23 1239 01/23/23 0936  DDIMER 2.78* 0.94*     Radiology    No results found.  Cardiac Studies   TTE (01/22/2023):  1. Left ventricular ejection fraction, by estimation, is 60 to 65%. Left  ventricular ejection fraction by PLAX is 63 %. The left ventricle has  normal function. The left ventricle has no regional wall motion  abnormalities. Left ventricular diastolic  parameters are indeterminate.   2. Right ventricular systolic function is low normal. The right  ventricular size is mildly enlarged. There is moderately elevated  pulmonary artery systolic pressure. The estimated right ventricular  systolic pressure is 51.2 mmHg.   3. The mitral valve is normal in structure. Mild mitral valve  regurgitation. No evidence of mitral stenosis.   4. Tricuspid valve regurgitation is moderate to severe.   5. The aortic valve is tricuspid. Aortic valve regurgitation is not  visualized. No aortic stenosis is present.   6. The inferior vena cava is normal in size with greater than 50%  respiratory variability, suggesting right atrial pressure of 3 mmHg.   Patient Profile     82 y.o. 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 We are consulted for hypotension.  Assessment & Plan    Hypotension: BP improving with  patient successfully weaned of norepinephrine.  No indication that cardiogenic shock has been driving hypotension. -Continue midodrine per primary team.  Pulmonary hypertension: Longstanding and stable on echo this admission.  Patient breathing comfortably with normal oxygen saturation on room air. -Consider further workup as an outpatient. -Maintain net even fluid balance.  PAF: Telemetry shows maintenance of sinus rhythm. -Continue amiodarone. -Continue apixaban.  Acute kidney injury superimposed on chronic kidney disease: Creatinine improved today and near baseline. -Maintain net even fluid balance. Avoid nephrotoxic agents.  Anemia: Hemoglobin stable. -Maintain hemoglobin > 8.  COVID-19: Currently asymptomatic. -Supportive care per primary team.  [facility] will sign off.   Medication Recommendations:  Continue current medications. Other recommendations (labs, testing, etc):  None Follow up as an outpatient:  3-4 weeks after discharge with Dr. Graciela Husbands or APP.  For questions or updates, please contact [facility] Please consult [email or web address] for contact info under [facility].    Signed, Yvonne Kendall, MD  01/24/2023, 2:22 PM

## 2023-01-24 NOTE — Progress Notes (Signed)
NAME:  KYLEE ERNZEN, MRN:  [id number], DOB:  1941-03-14, LOS: 4 ADMISSION DATE:  01/20/2023, CONSULTATION DATE: 01/21/2023 REFERRING MD: Dr. Laural Benes, CHIEF COMPLAINT: Hypotension    History of Present Illness:  This is an 82 yo female who presented to [facility] ER on 05/10 via EMS from [facility] with right hip pain after tripping and having a witness fall.  She denied hitting her head or LOC.  Pt does take eliquis for atrial fibrillation.  At baseline she ambulates with a walker.    ED Course  Upon arrival to the ER pt hypotensive with maps in the low 60's.  Right hip x-ray revealed comminuted intertrochanteric right hip fracture, with impaction and varus angulation at the fracture site.  Orthopedic Surgeon Dr. Allena Katz consulted, and pt admitted to the [facility] unit per hospitalist team pending right hip cephalomedullary nailing scheduled for 05/11.     01/23/23-patient s/p evaluation by cardio/renal/ortho.  Remains on levophed.  Decision to start eliquis low dose 2.5 per cardio/ortho today.  Monitoring H/h will transfuse 1 unit today.  Mentation seems to be improving some with periods of confusion.  PT/OT after transfusion.  Taking PO well have increased her midodrine to 10 tid.   01/24/23- patient is off vasopressors today.  We did place her on 10 of midodrine and she has baseline hypotension.  She has oozing of blood from surgical site she's on eliquis 2.5 bid.  PT was not comfortable working with her today.  We will refine her therapy today and try to get her more stable for OOB.    Pertinent  Medical History  Paroxysmal Atrial Fibrillation on eliquis  GERD  Anemia  Anxiety Bowel Obstruction  CAD  Stage IV CKD Depression  Type II Diabetes Mellitus Gastritis  GI Bleed  HTN  Hypothyroidism  Chronic Back Pain  Morbid Obesity  Permanent Cardiac Pacermaker  Sinus Node Dysfunction  Stroke  TIA   Significant Hospital Events: Including procedures, antibiotic start and stop  dates in addition to other pertinent events   05/10: Pt admitted to the [facility] unit with a right intertrochanteric hip fracture following a mechanical fall  05/11: Pt underwent intramedullary nailing of the right femur with cephalomedullary device. Postop pt hypotensive requiring levophed gtt. Pt also developed acute hypercapnic respiratory failure requiring Bipap. PCCM team consulted to assist with management 05/12: No acute events overnight.  Pt pending Cardiology and Nephrology consults  01/23/23- no distress this am, remains hypotensive of 108mcg/kg/min levophed support.  Plan to rehydrate with more IVF today and wean off vasopressors. COVID is mild and stable on room air, will dc remdesevir  Interim History / Subjective:  Pt transitioned off Bipap this am tolerating 2L O2 via nasal canula.  Remains on levophed gtt @21  mcg/min to maintain map 60 or higher will add vasopressin.    Objective   Blood pressure (!) 99/45, pulse (!) 59, temperature 98.2 F (36.8 C), temperature source Oral, resp. rate 14, height 5\' 3"  (1.6 m), weight 78.5 kg, SpO2 95 %.        Intake/Output Summary (Last 24 hours) at 01/24/2023 0842 Last data filed at 01/24/2023 0600 Gross per 24 hour  Intake 2149.23 ml  Output 1285 ml  Net 864.23 ml    Filed Weights   01/20/23 1310 01/21/23 2100 01/24/23 0500  Weight: 72 kg 70.1 kg 78.5 kg    Examination: General: Acute on chronically-ill appearing female, NAD on 2L O2  HENT: Supple, no JVD  Lungs: Clear  throughout, even, non labored  Cardiovascular: Atrial paced, hr 70, no m/r/g, 2+ radial/1+ distal pulses, trace right hip edema  Abdomen: +BS x4, soft, non tender, non distended  Extremities: Right hip fracture post repair, normal bulk  Skin: Right hip incision with honeycomb dressing dry/intact  Neuro: Slightly lethargic, oriented, following commands, PERRLA  GU: Indwelling foley catheter draining clear yellow urine   Resolved Hospital Problem list    Assessment  & Plan:  #Acute metabolic encephalopathy~improving   - Correct metabolic derangements - Avoid sedating medication when able  - Frequent reorientation   #Shock: cardiogenic and hypovolemic; possible adrenal insufficiency  #HFpEF #Pulmonary HTN  Hx: Paroxysmal atrial fibrillation, permanent cardiac pacemaker, CAD, sinus node dysfunction  - Continuous telemetry monitoring  - Trend troponin's until peaked  - Echo pending   - TSH 4.492; free T4 1.03 - CVP 10-12 will hold on additional iv fluids for now BNP 1,487.0 - Continue scheduled midodrine and prn levophed/vasopressin gtts to maintain map >65 - Cardiology consulted appreciate input - Concern for possible PE or fat embolism unable to obtain CTA PE due to acute on chronic renal failure will DC HEPARIN AND START ELIQUIS PER CARDIO RECOMMENDATIONS  #Acute hypercapnic respiratory failure- RESOLVED  - Prn Bipap for hypercapnia and/or hypoxia  - Prn supplemental O2 for dyspnea and/or hypoxia  - Maintain O2 sats >92% - Follow CXR and ABG's  - Will r/o COVID  - Aggressive pulmonary hygiene when able   #Acute kidney injury superimposed on CKD stage IV~worsening  - Trend BMP  - Replace electrolytes as indicated  - Strict intake and output  - Avoid nephrotoxic medications  - Nephrology consulted appreciate- REVIWED PLAN WITH DR Wynelle Link  #Anemia no obvious signs of active bleeding  - Trend CBC  - Folic acid and vitamin B12 levels within normal limits  - Fibrinogen/PT/INR within normal limits  - D-dimer 2.78 - VTE px: heparin gtt  - Monitor for s/sx of bleeding  - Transfuse for hgb <8  #Transaminitis likely secondary to hypotension~improving  #Chronically elevated alk phosphatase~resolved   [place] RUQ Limited 01/21/23: fatty liver and s/p cholecystectomy  - Trend hepatic function panel  - Avoid hepatoxic medications as able   #Leukocytosis no obvious signs of infection  - Trend WBC and monitor fever curve  - Trend PCT  - Follow  cultures  - Will start empiric zosyn due to elevated pct pending culture results   #Right intertrochanteric hip fracture following a mechanical fall s/p intramedullary nail intertrochanteric  #Acute pain  - Orthopedic consulted appreciate input  - 12.5 mg fentanyl x1 dose pending bedside swallowing evaluation if pt passes will continue prn oxycodone  - PT/OT once hypotension resolves  #GERD  Hx Gastritis  - IV pepcid   #Type II Diabetes Mellitus  - CBG's q4hrs  - Changed from sensitive to moderate SSI   Best Practice (right click and "Reselect all SmartList Selections" daily)   Diet/type: NPO pending bedside swallow evaluation  DVT prophylaxis: SCD GI prophylaxis: H2B Lines: Yes and still needed  Foley: Yes and still needed  Code Status:  full code Last date of multidisciplinary goals of care discussion [01/22/2023]  05/12: Pt updated at bedside regarding current plan of care.  Will also update pts son Francisco Dohrmann via telephone today   Labs   CBC: Recent Labs  Lab 01/20/23 1022 01/21/23 0420 01/21/23 1239 01/21/23 1936 01/22/23 0246 01/22/23 0845 01/23/23 0408 01/23/23 0410 01/23/23 0630 01/23/23 5409 01/23/23 1820 01/24/23 0101 01/24/23 8119  WBC 5.6   < > 14.4*  --  16.1*  --  9.5 10.0  --   --   --   --  5.7  NEUTROABS 3.5  --  13.4*  --  13.0*  --  8.0*  --   --   --   --   --   --   HGB 13.0   < > 10.3*   < > 9.3*   < > 7.8* 7.6* 7.8* 7.1* 8.1* 8.5* 8.4*  HCT 39.2   < > 32.5*   < > 29.8*   < > 23.9* 23.4* 24.1* 22.1* 24.9* 26.0* 26.3*  MCV 97.8   < > 101.6*  --  103.8*  --  99.2 98.3  --   --   --   --  98.9  PLT 160   < > 172  --  143*  --  119* 102*  --   --   --   --  119*   < > = values in this interval not displayed.     Basic Metabolic Panel: Recent Labs  Lab 01/21/23 0420 01/21/23 1239 01/22/23 0246 01/23/23 0410 01/24/23 0356  NA 139 135 138 136 139  K 5.0 4.5 4.3 4.2 4.0  CL 107 108 107 104 109  CO2 25 19* 23 19* 24  GLUCOSE 274* 284*  239* 216* 181*  BUN 44* 48* 51* 50* 43*  CREATININE 2.36* 2.48* 2.68* 2.01* 1.54*  CALCIUM 8.3* 7.5* 8.0* 8.0* 8.3*  MG  --  1.7 2.2 2.2 2.0  PHOS  --  5.6* 6.3* 3.5 3.1    GFR: Estimated Creatinine Clearance: 27.9 mL/min (A) (by C-G formula based on SCr of 1.54 mg/dL (H)). Recent Labs  Lab 01/21/23 1239 01/21/23 1712 01/22/23 0246 01/22/23 0813 01/23/23 0408 01/23/23 0410 01/24/23 0356  PROCALCITON 2.12  --   --  2.40 1.23  --   --   WBC 14.4*  --  16.1*  --  9.5 10.0 5.7  LATICACIDVEN 1.5 1.3  --  1.3  --   --   --      Liver Function Tests: Recent Labs  Lab 01/20/23 1022 01/21/23 0420 01/21/23 1239 01/22/23 0246  AST 72* 98* 84* 72*  ALT 37 61* 58* 40  ALKPHOS 151* 156* 133* 119  BILITOT 1.7* 1.5* 1.4* 0.7  PROT 7.3 6.5 5.8* 6.1*  ALBUMIN 4.1 3.5 3.2* 3.3*    No results for input(s): "LIPASE", "AMYLASE" in the last 168 hours. No results for input(s): "AMMONIA" in the last 168 hours.  ABG    Component Value Date/Time   PHART 7.34 (L) 01/22/2023 1400   PCO2ART 36 01/22/2023 1400   PO2ART 102 01/22/2023 1400   HCO3 19.4 (L) 01/22/2023 1400   TCO2 24 07/15/2020 0921   ACIDBASEDEF 5.7 (H) 01/22/2023 1400   O2SAT 98.8 01/22/2023 1400     Coagulation Profile: Recent Labs  Lab 01/20/23 1045 01/21/23 1239 01/23/23 0936  INR 1.2 1.3* 1.3*     Cardiac Enzymes: Recent Labs  Lab 01/21/23 1239  CKTOTAL 268*     HbA1C: Hemoglobin A1C  Date/Time Value Ref Range Status  09/26/2012 12:00 PM 7.5 (H) 4.2 - 6.3 % Final    Comment:    The American Diabetes Association recommends that a primary goal of therapy should be <7% and that physicians should reevaluate the treatment regimen in patients with HbA1c values consistently >8%.    Hgb A1c MFr Bld  Date/Time Value  Ref Range Status  01/21/2023 04:20 AM 6.8 (H) 4.8 - 5.6 % Final    Comment:    (NOTE) Pre diabetes:          5.7%-6.4%  Diabetes:              >6.4%  Glycemic control for    <7.0% adults with diabetes   05/29/2022 10:29 PM 6.2 (H) 4.8 - 5.6 % Final    Comment:    (NOTE) Pre diabetes:          5.7%-6.4%  Diabetes:              >6.4%  Glycemic control for   <7.0% adults with diabetes     CBG: Recent Labs  Lab 01/23/23 1554 01/23/23 1923 01/23/23 2315 01/24/23 0355 01/24/23 0801  GLUCAP 212* 248* 231* 172* 177*     Review of Systems: Positives in BOLD   Gen: Denies fever, chills, weight change, fatigue, night sweats HEENT: Denies blurred vision, double vision, hearing loss, tinnitus, sinus congestion, rhinorrhea, sore throat, neck stiffness, dysphagia PULM: Denies shortness of breath, cough, sputum production, hemoptysis, wheezing CV: Denies chest pain, edema, orthopnea, paroxysmal nocturnal dyspnea, palpitations GI: Denies abdominal pain, nausea, vomiting, diarrhea, hematochezia, melena, constipation, change in bowel habits GU: Denies dysuria, hematuria, polyuria, oliguria, urethral discharge MUSC: postop right hip pain Endocrine: Denies hot or cold intolerance, polyuria, polyphagia or appetite change Derm: Denies rash, dry skin, scaling or peeling skin change Heme: Denies easy bruising, bleeding, bleeding gums Neuro: Denies headache, numbness, weakness, slurred speech, loss of memory or consciousness  Past Medical History:  She,  has a past medical history of Acid reflux, Acute respiratory failure (HCC) (04/09/2018), Anemia, Anxiety, Bowel obstruction (HCC), Carotid artery disease (HCC), Chronic heart failure with preserved ejection fraction (HFpEF) (HCC), CKD (chronic kidney disease), stage IV (HCC), Closed fracture of one rib of right side (05/29/2020), Complete tear of left rotator cuff (10/16/2014), Coronary artery disease (non-obstructive), Depression, Diabetes mellitus without complication (HCC), Diabetic neuropathy (HCC), Diarrhea (03/04/2022), Fatty liver, Gastritis without bleeding, GI bleed, Gout, Hypertension, Hypothyroidism, Low back  pain, Morbid obesity (HCC), Multiple gastric polyps, Osteoarthritis, Osteoporosis, PAF (paroxysmal atrial fibrillation) (HCC) (2016), Paroxysmal atrial fibrillation (HCC), Presence of permanent cardiac pacemaker, Pulmonary hypertension (HCC), Sinus node dysfunction (HCC) (08/24/2017), Sinus node dysfunction (HCC), Tendinitis of upper biceps tendon of left shoulder (11/10/2019), TIA (transient ischemic attack), 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   Surgical History:   Past Surgical History:  Procedure Laterality Date   ABDOMINAL HYSTERECTOMY  1980   APPENDECTOMY     BACK SURGERY     2008. plate & screws    BACK SURGERY  2019   patient describes kyphoplasty for compression fractures, MD referral said fusion   BREAST BIOPSY Right 08/16   stereo fibroadenomatous change, neg for atypia   BREAST EXCISIONAL BIOPSY Left    neg   CARDIAC CATHETERIZATION Left 08/25/2016   Procedure: Left Heart Cath and Coronary Angiography;  Surgeon: Lamar Blinks, MD;  Location: [facility] INVASIVE CV LAB;  Service: Cardiovascular;  Laterality: Left;   CARDIOVERSION N/A 06/28/2017   Procedure: CARDIOVERSION;  Surgeon: Lamar Blinks, MD;  Location: [facility] ORS;  Service: Cardiovascular;  Laterality: N/A;   CARDIOVERSION N/A 02/06/2018   Procedure: CARDIOVERSION;  Surgeon: Wendall Stade, MD;  Location: [facility] ENDOSCOPY;  Service: Cardiovascular;  Laterality: N/A;   CARDIOVERSION N/A 07/15/2020  Procedure: CARDIOVERSION;  Surgeon: Jake Bathe, MD;  Location: [facility] ENDOSCOPY;  Service: Cardiovascular;  Laterality: N/A;   CATARACT EXTRACTION, BILATERAL  2012   CHOLECYSTECTOMY     colon blockage  1999   COLON SURGERY  2000   removed 20% of colon. colon had collapsed   COLONOSCOPY  2016   polyps removed 2016   DORSAL COMPARTMENT RELEASE Left 09/14/2016   Procedure:  RELEASE DORSAL COMPARTMENT (DEQUERVAIN);  Surgeon: Donato Heinz, MD;  Location: [facility] ORS;  Service: Orthopedics;  Laterality: Left;   ESOPHAGOGASTRODUODENOSCOPY N/A 01/27/2015   Procedure: ESOPHAGOGASTRODUODENOSCOPY (EGD);  Surgeon: Christena Deem, MD;  Location: [facility] ENDOSCOPY;  Service: Endoscopy;  Laterality: N/A;   ESOPHAGOGASTRODUODENOSCOPY (EGD) WITH PROPOFOL N/A 07/24/2015   Procedure: ESOPHAGOGASTRODUODENOSCOPY (EGD) WITH PROPOFOL;  Surgeon: Midge Minium, MD;  Location: [facility] ENDOSCOPY;  Service: Endoscopy;  Laterality: N/A;   ESOPHAGOGASTRODUODENOSCOPY (EGD) WITH PROPOFOL N/A 04/12/2018   Procedure: ESOPHAGOGASTRODUODENOSCOPY (EGD) WITH PROPOFOL;  Surgeon: Wyline Mood, MD;  Location[facility] ENDOSCOPY;  Service: Gastroenterology;  Laterality: N/A;   ESOPHAGOGASTRODUODENOSCOPY (EGD) WITH PROPOFOL N/A 06/22/2018   Procedure: ESOPHAGOGASTRODUODENOSCOPY (EGD) WITH PROPOFOL;  Surgeon: Rachael Fee, MD;  Location: [facility] ENDOSCOPY;  Service: Endoscopy;  Laterality: N/A;   ESOPHAGOGASTRODUODENOSCOPY (EGD) WITH PROPOFOL N/A 07/09/2018   Procedure: ESOPHAGOGASTRODUODENOSCOPY (EGD) WITH PROPOFOL with resection of gastric polyps;  Surgeon: Wyline Mood, MD;  Location: [facility] ENDOSCOPY;  Service: Gastroenterology;  Laterality: N/A;   ESOPHAGOGASTRODUODENOSCOPY (EGD) WITH PROPOFOL N/A 05/17/2019   Procedure: ESOPHAGOGASTRODUODENOSCOPY (EGD) WITH PROPOFOL;  Surgeon: Wyline Mood, MD;  Location: [facility] ENDOSCOPY;  Service: Gastroenterology;  Laterality: N/A;   EUS N/A 06/22/2018   Procedure: UPPER ENDOSCOPIC ULTRASOUND (EUS) RADIAL;  Surgeon: Rachael Fee, MD;  Location: [facility] ENDOSCOPY;  Service: Endoscopy;  Laterality: N/A;   EYE SURGERY Bilateral    cataract extractions   HUMERUS IM NAIL Left 06/02/2022   Procedure: INTRAMEDULLARY (IM) NAIL HUMERAL;  Surgeon: Christena Flake, MD;  Location: [facility] ORS;  Service: Orthopedics;  Laterality: Left;   INTRAMEDULLARY (IM) NAIL INTERTROCHANTERIC Right 01/21/2023    Procedure: INTRAMEDULLARY (IM) NAIL INTERTROCHANTERIC;  Surgeon: Signa Kell, MD;  Location: [facility] ORS;  Service: Orthopedics;  Laterality: Right;   JOINT REPLACEMENT Bilateral 2014   Bilateral Knee replacement   LEAD REVISION/REPAIR N/A 08/29/2017   Procedure: LEAD REVISION/REPAIR;  Surgeon: Regan Lemming, MD;  Location: [facility] INVASIVE CV LAB;  Service: Cardiovascular;  Laterality: N/A;   OOPHORECTOMY     PACEMAKER INSERTION Left 07/01/2015   Procedure: INSERTION PACEMAKER;  Surgeon: Marcina Millard, MD;  Location: [facility] ORS;  Service: Cardiovascular;  Laterality: Left;   PACEMAKER REVISION N/A 08/28/2017   Procedure: PACEMAKER REVISION;  Surgeon: Duke Salvia, MD;  Location: [facility] INVASIVE CV LAB;  Service: Cardiovascular;  Laterality: N/A;   REPLACEMENT TOTAL KNEE BILATERAL     SHOULDER ARTHROSCOPY WITH ROTATOR CUFF REPAIR AND OPEN BICEPS TENODESIS Left 11/21/2019   Procedure: LEFT SHOULDER ARTHROSCOPY WITH DEBRIDEMENT, DECOMPRESSION, ROTATOR CUFF REPAIR AND BICEPS TENOLYSIS;  Surgeon: Christena Flake, MD;  Location: [facility] ORS;  Service: Orthopedics;  Laterality: Left;   SHOULDER ARTHROSCOPY WITH SUBACROMIAL DECOMPRESSION Left 2013   TEE WITHOUT CARDIOVERSION N/A 06/28/2017   Procedure: TRANSESOPHAGEAL ECHOCARDIOGRAM (TEE);  Surgeon: Lamar Blinks, MD;  Location: [facility] ORS;  Service: Cardiovascular;  Laterality: N/A;     Social History:   reports that she has never smoked. She has never used smokeless tobacco. She reports that she does not drink alcohol and does not use drugs.  Family History:  Her family history includes CAD in her mother; Cancer in her father; Diabetes Mellitus II in her brother and mother; Heart attack in her mother; Stroke in her brother. There is no history of Breast cancer.   Allergies Allergies  Allergen Reactions   Cephalosporins Other (See Comments), Diarrhea and Nausea Only    Reaction:  Unknown  Other reaction(s): Other (see comments) Other  reaction(s): Other (See Comments) Other Reaction: Intolerance  Reaction:  Stomach pain  Other Reaction: Intolerance Reaction:  Unknown    Fish Allergy Shortness Of Breath    All kinds of fish. Severe vomitting even with smell of fish.    Macrolides And Ketolides Other (See Comments)    Severe stomach pain (mycins)   Meperidine Shortness Of Breath, Nausea Only and Other (See Comments)    Stomach pain    Other Nausea Only, Swelling, Rash, Anaphylaxis, Diarrhea, Shortness Of Breath and Other (See Comments)    Allergen: NON-STEROIDS, bextra - hands and feet swell  Reaction:  Stomach pain   Other Reaction: Intolerance   Prednisone Anaphylaxis and Other (See Comments)    (facial swelling/redness/burning)Increases pts blood sugar  Pt states that she is allergic to all steroids.     Shellfish Allergy Anaphylaxis, Shortness Of Breath, Diarrhea, Nausea Only and Rash    Stomach pain    Sulfa Antibiotics Shortness Of Breath, Diarrhea, Nausea Only and Other (See Comments)    Reaction:  Stomach pain   Reaction:  Stomach pain  Other Reaction: Intolerance   Sulfacetamide Sodium Diarrhea, Nausea Only, Other (See Comments) and Shortness Of Breath    Reaction:  Stomach pain    Telbivudine Other (See Comments)    Unknown reaction   Uloric [Febuxostat] Anaphylaxis    Locks pt's body up    Aspirin Rash and Other (See Comments)    Stomach pain (tolerates lower doses)   Celecoxib Other (See Comments)    GI bleed, weakness, and stomach pain.     Cephalexin Diarrhea, Nausea Only and Other (See Comments)    stomach pain    Codeine Diarrhea, Nausea Only and Other (See Comments)    Reaction:  Stomach pain Pt tolerates morphine    Dilaudid [Hydromorphone Hcl] Other (See Comments)    Reactions: easy to overdose - blood pressure drops really low   Erythromycin Diarrhea, Nausea Only and Other (See Comments)    Reaction:  Stomach pain    Iodinated Contrast Media Rash and Other (See Comments)    Pt  states that she is unable to have because she has chronic kidney disease.   CKD Pt states that she is unable to have because she has chronic kidney disease.   CKD    Lipitor [Atorvastatin Calcium] Nausea Only    Reaction: nausea, weakness, pass blood   Oxycodone Other (See Comments)    Reaction:  Stomach pain    Aleve [Naproxen]     Reaction: severe stomach pain   Atorvastatin Nausea Only and Other (See Comments)    Weakness    Doxycycline     Stomach pain    Iodine Rash   Motrin [Ibuprofen]     Reaction: severe stomach pain   Tape Other (See Comments)    Causes pts skin to tear  Pt states that she is able to use paper tape.       Valdecoxib Swelling and Other (See Comments)    Pt states that her hands and feet swell.       Home  Medications  Prior to Admission medications   Medication Sig Start Date End Date Taking? Authorizing Provider  acetaminophen (TYLENOL) 325 MG tablet Take 2 tablets (650 mg total) by mouth every 6 (six) hours as needed for mild pain, moderate pain or fever (or Fever >/= 101). Patient taking differently: Take 325 mg by mouth every 6 (six) hours as needed for mild pain, moderate pain or fever (or Fever >/= 101). 06/08/22  Yes Lurene Shadow, MD  acetaminophen (TYLENOL) 500 MG tablet Take 500 mg by mouth in the morning, at noon, and at bedtime.   Yes [provider]  albuterol (VENTOLIN HFA) 108 (90 Base) MCG/ACT inhaler Inhale 2 puffs into the lungs every 6 (six) hours as needed for wheezing or shortness of breath. Ventolin   Yes [provider]  allopurinol (ZYLOPRIM) 100 MG tablet Take 100 mg by mouth daily.   Yes [provider]  amiodarone (PACERONE) 200 MG tablet TAKE 1 TABLET BY MOUTH ONCE DAILY . APPOINTMENT REQUIRED FOR FUTURE REFILLS 02/14/22  Yes Duke Salvia, MD  apixaban (ELIQUIS) 2.5 MG TABS tablet Take 1 tablet (2.5 mg total) by mouth 2 (two) times daily. 03/06/22  Yes Sunnie Nielsen, DO  ciprofloxacin (CIPRO) 500  MG tablet Take 500 mg by mouth daily. 05/07 - 05/14   Yes [provider]  docusate sodium (COLACE) 100 MG capsule Take 100 mg by mouth 2 (two) times daily.   Yes [provider]  famotidine (PEPCID) 10 MG tablet Take 10 mg by mouth daily.   Yes [provider]  Fluticasone Furoate (FLONASE SENSIMIST NA) Place 1 spray into the nose daily.   Yes [provider]  furosemide (LASIX) 40 MG tablet Take 40 mg by mouth daily.   Yes [provider]  gabapentin (NEURONTIN) 100 MG capsule Take 100 mg by mouth 2 (two) times daily. 05/07 - 05/11   Yes [provider]  HUMALOG 100 UNIT/ML injection Inject 0-12 Units into the skin 3 (three) times daily with meals. Sliding scale 05/08/22  Yes [provider]  LANTUS 100 UNIT/ML injection 16 Units at bedtime. 05/10/22  Yes [provider]  Levothyroxine Sodium 37.5 MCG CAPS Take 37.5 mcg by mouth daily before breakfast. 01/05/23  Yes Duke Salvia, MD  midodrine (PROAMATINE) 5 MG tablet Take 5 mg by mouth 3 (three) times daily with meals. 01/18/23 07/17/23 Yes [provider]  oxybutynin (DITROPAN) 5 MG tablet Take 5 mg by mouth 3 (three) times daily. Breakfast & bedtime   Yes [provider]  Polyethyl Glycol-Propyl Glycol 0.4-0.3 % SOLN Place 1 drop into both eyes 2 (two) times daily as needed (dry eyes).   Yes [provider]  pravastatin (PRAVACHOL) 20 MG tablet Take 20 mg by mouth at bedtime.  07/25/18  Yes [provider]  Prenat-FeCbn-FeBisg-FA-Omega (MULTIVITAMIN/MINERALS PO) Take 1 tablet by mouth daily.   Yes [provider]  rOPINIRole (REQUIP) 0.25 MG tablet Take 1 tablet (0.25 mg total) by mouth at bedtime. 06/08/22  Yes Lurene Shadow, MD  sertraline (ZOLOFT) 25 MG tablet Take 25 mg by mouth daily.   Yes [provider]  sertraline (ZOLOFT) 50 MG tablet Take 50 mg by mouth daily.   Yes [provider]  talc powder Apply 1  application topically as needed (under the breast irritation).   Yes [provider]  traZODone (DESYREL) 50 MG tablet Take 50 mg by mouth at bedtime as needed for sleep. 06/07/18  Yes [provider]  traZODone (DESYREL) 50 MG tablet Take 25 mg by mouth 2 (two) times daily as needed for sleep. Do not give within 2 hours of night dose   Yes [provider]  traZODone (DESYREL) 50 MG tablet Take 50 mg by mouth at bedtime.   Yes [provider]  vitamin B-12 (CYANOCOBALAMIN) 1000 MCG tablet Take 1,000 mcg by mouth daily.   Yes [provider]  alum & mag hydroxide-simeth (MAALOX/MYLANTA) 200-200-20 MG/5ML suspension Take 10 mLs by mouth 3 (three) times daily as needed for indigestion or heartburn.    [provider]  senna (SENOKOT) 8.6 MG TABS tablet Take 1 tablet by mouth every Monday, Wednesday, and Friday.    [provider]  Simethicone LIQD 10 mLs by Does not apply route 3 (three) times daily as needed. 200-200-20 mg/5 mL Patient not taking: Reported on 01/20/2023    [provider]     Critical care provider statement:   Total critical care time: 33 minutes   Performed by: Karna Christmas MD   Critical care time was exclusive of separately billable procedures and treating other patients.   Critical care was necessary to treat or prevent imminent or life-threatening deterioration.   Critical care was time spent personally by me on the following activities: development of treatment plan with patient and/or surrogate as well as nursing, discussions with consultants, evaluation of patient's response to treatment, examination of patient, obtaining history from patient or surrogate, ordering and performing treatments and interventions, ordering and review of laboratory studies, ordering and review of radiographic studies, pulse oximetry and re-evaluation of patient's condition.    Vida Rigger, M.D.  Pulmonary & Critical Care  Medicine

## 2023-01-24 NOTE — Progress Notes (Signed)
[facility]  ROUNDING NOTE   Subjective:   States pain is controlled.   UOP .   Creatinine 1.54 (2.01)  PRBC transfusion yesterday.   Objective:  Vital signs in last 24 hours:  Temp:  [97.8 F (36.6 C)-98.2 F (36.8 C)] 97.8 F (36.6 C) (05/14 1200) Pulse Rate:  [48-128] 61 (05/14 1400) Resp:  [11-24] 17 (05/14 1400) BP: (85-163)/(38-78) 116/49 (05/14 1400) SpO2:  [92 %-100 %] 95 % (05/14 1400) Weight:  [78.5 kg] 78.5 kg (05/14 0500)  Weight change:  Filed Weights   01/20/23 1310 01/21/23 2100 01/24/23 0500  Weight: 72 kg 70.1 kg 78.5 kg    Intake/Output: I/O last 3 completed shifts: In: 2939.2 [P.O.:480; I.V.:614; Blood:340; IV Piggyback:1505.1] Out: 2095 [Urine:2095]   Intake/Output this shift:  Total I/O In: 870 [P.O.:870] Out: 235 [Urine:235]  Physical Exam: General: NAD, sitting up in bed  Head: Normocephalic, atraumatic. Moist oral mucosal membranes  Eyes: Anicteric, PERRL  Neck: Supple, trachea midline  Lungs:  Clear to auscultation  Heart: Regular rate and rhythm  Abdomen:  Soft, nontender,   Extremities:  no peripheral edema.  Neurologic: Nonfocal, moving all four extremities  Skin: No lesions  Access: none    Basic Metabolic Panel: Recent Labs  Lab 01/21/23 0420 01/21/23 1239 01/22/23 0246 01/23/23 0410 01/24/23 0356  NA 139 135 138 136 139  K 5.0 4.5 4.3 4.2 4.0  CL 107 108 107 104 109  CO2 25 19* 23 19* 24  GLUCOSE 274* 284* 239* 216* 181*  BUN 44* 48* 51* 50* 43*  CREATININE 2.36* 2.48* 2.68* 2.01* 1.54*  CALCIUM 8.3* 7.5* 8.0* 8.0* 8.3*  MG  --  1.7 2.2 2.2 2.0  PHOS  --  5.6* 6.3* 3.5 3.1     Liver Function Tests: Recent Labs  Lab 01/20/23 1022 01/21/23 0420 01/21/23 1239 01/22/23 0246  AST 72* 98* 84* 72*  ALT 37 61* 58* 40  ALKPHOS 151* 156* 133* 119  BILITOT 1.7* 1.5* 1.4* 0.7  PROT 7.3 6.5 5.8* 6.1*  ALBUMIN 4.1 3.5 3.2* 3.3*    No results for input(s): "LIPASE", "AMYLASE" in the last 168  hours. No results for input(s): "AMMONIA" in the last 168 hours.  CBC: Recent Labs  Lab 01/20/23 1022 01/21/23 0420 01/21/23 1239 01/21/23 1936 01/22/23 0246 01/22/23 0845 01/23/23 0408 01/23/23 0410 01/23/23 0630 01/23/23 0936 01/23/23 1820 01/24/23 0101 01/24/23 0356 01/24/23 1219  WBC 5.6   < > 14.4*  --  16.1*  --  9.5 10.0  --   --   --   --  5.7  --   NEUTROABS 3.5  --  13.4*  --  13.0*  --  8.0*  --   --   --   --   --   --   --   HGB 13.0   < > 10.3*   < > 9.3*   < > 7.8* 7.6*   < > 7.1* 8.1* 8.5* 8.4* 9.0*  HCT 39.2   < > 32.5*   < > 29.8*   < > 23.9* 23.4*   < > 22.1* 24.9* 26.0* 26.3* 27.9*  MCV 97.8   < > 101.6*  --  103.8*  --  99.2 98.3  --   --   --   --  98.9  --   PLT 160   < > 172  --  143*  --  119* 102*  --   --   --   --  119*  --    < > = values in this interval not displayed.     Cardiac Enzymes: Recent Labs  Lab 01/21/23 1239  CKTOTAL 268*     BNP: Invalid input(s): "POCBNP"  CBG: Recent Labs  Lab 01/23/23 1923 01/23/23 2315 01/24/23 0355 01/24/23 0801 01/24/23 1142  GLUCAP 248* 231* 172* 177* 216*     Microbiology: Results for orders placed or performed during the hospital encounter of 01/20/23  MRSA Next Gen by PCR, Nasal     Status: None   Collection Time: 01/20/23 11:00 PM   Specimen: Nasal Mucosa; Nasal Swab  Result Value Ref Range Status   MRSA by PCR Next Gen NOT DETECTED NOT DETECTED Final    Comment: (NOTE) The GeneXpert MRSA Assay (FDA approved for NASAL specimens only), is one component of a comprehensive MRSA colonization surveillance program. It is not intended to diagnose MRSA infection nor to guide or monitor treatment for MRSA infections. Test performance is not FDA approved in patients less than 91 years old. Performed at [facility], [address] [place], [place] [address]   Culture, blood (Routine X 2) w Reflex to ID Panel     Status: None (Preliminary result)   Collection Time: 01/21/23   1:37 PM   Specimen: BLOOD  Result Value Ref Range Status   Specimen Description BLOOD BLOOD RIGHT HAND  Final   Special Requests   Final    BOTTLES DRAWN AEROBIC AND ANAEROBIC Blood Culture results may not be optimal due to an inadequate volume of blood received in culture bottles   Culture   Final    NO GROWTH 3 DAYS Performed at [facility], [address], [place], [place] [address]    Report Status PENDING  Incomplete  Culture, blood (Routine X 2) w Reflex to ID Panel     Status: None (Preliminary result)   Collection Time: 01/21/23  3:03 PM   Specimen: BLOOD RIGHT HAND  Result Value Ref Range Status   Specimen Description BLOOD RIGHT HAND  Final   Special Requests   Final    BOTTLES DRAWN AEROBIC ONLY Blood Culture results may not be optimal due to an inadequate volume of blood received in culture bottles   Culture   Final    NO GROWTH 3 DAYS Performed at [facility], [address] [place], [place] [address]    Report Status PENDING  Incomplete  MRSA Next Gen by PCR, Nasal     Status: None   Collection Time: 01/21/23  6:30 PM   Specimen: Nasal Mucosa; Nasal Swab  Result Value Ref Range Status   MRSA by PCR Next Gen NOT DETECTED NOT DETECTED Final    Comment: (NOTE) The GeneXpert MRSA Assay (FDA approved for NASAL specimens only), is one component of a comprehensive MRSA colonization surveillance program. It is not intended to diagnose MRSA infection nor to guide or monitor treatment for MRSA infections. Test performance is not FDA approved in patients less than 39 years old. Performed at [facility], [address] [place], [place] [address]   SARS Coronavirus 2 by RT PCR (hospital order, performed in [facility] hospital lab) *cepheid single result test* Anterior Nasal Swab     Status: Abnormal   Collection Time: 01/22/23  8:13 AM   Specimen: Anterior Nasal Swab  Result Value Ref Range Status   SARS Coronavirus 2 by RT PCR POSITIVE  (A) NEGATIVE Final    Comment: (NOTE) SARS-CoV-2 target  nucleic acids are DETECTED  SARS-CoV-2 RNA is generally detectable in upper respiratory specimens  during the acute phase of infection.  Positive results are indicative  of the presence of the identified virus, but do not rule out bacterial infection or co-infection with other pathogens not detected by the test.  Clinical correlation with patient history and  other diagnostic information is necessary to determine patient infection status.  The expected result is negative.  Fact Sheet for Patients:   RoadLapTop.co.za   Fact Sheet for Healthcare Providers:   http://kim-miller.com/    This test is not yet approved or cleared by the [place] FDA and  has been authorized for detection and/or diagnosis of SARS-CoV-2 by FDA under an Emergency Use Authorization (EUA).  This EUA will remain in effect (meaning this test can be used) for the duration of  the COVID-19 declaration under Section 564(b)(1)  of the Act, 21 U.S.C. section 360-bbb-3(b)(1), unless the authorization is terminated or revoked sooner.   Performed at [facility], [address] [place], [place] [address]     Coagulation Studies: Recent Labs    01/23/23 7846  LABPROT 16.7*  INR 1.3*     Urinalysis: Recent Labs    01/22/23 0246  COLORURINE YELLOW  LABSPEC >1.030*  PHURINE 5.5  GLUCOSEU NEGATIVE  HGBUR TRACE*  BILIRUBINUR SMALL*  KETONESUR TRACE*  PROTEINUR 30*  NITRITE NEGATIVE  LEUKOCYTESUR NEGATIVE       Imaging: No results found.   Medications:    sodium chloride 10 mL/hr at 01/24/23 0600   methocarbamol (ROBAXIN) IV Stopped (01/23/23 1854)   norepinephrine (LEVOPHED) Adult infusion Stopped (01/23/23 1556)    allopurinol  100 mg Oral Daily   amiodarone  200 mg Oral BID   apixaban  2.5 mg Oral BID   Chlorhexidine Gluconate Cloth  6 each Topical Daily   cyanocobalamin   1,000 mcg Oral Daily   docusate sodium  100 mg Oral BID   famotidine  20 mg Oral QHS   feeding supplement  237 mL Oral BID BM   folic acid  1 mg Oral Daily   gabapentin  100 mg Oral BID   hydrocortisone sod succinate (SOLU-CORTEF) inj  100 mg Intravenous Q12H   [START ON 01/25/2023] hydrocortisone sod succinate (SOLU-CORTEF) inj  50 mg Intravenous Q12H   insulin aspart  0-20 Units Subcutaneous Q4H   levothyroxine  37.5 mcg Oral QAC breakfast   midodrine  10 mg Oral TID WC   multivitamin with minerals  1 tablet Oral Daily   rOPINIRole  0.25 mg Oral QHS   sertraline  25 mg Oral Daily   sertraline  50 mg Oral Daily   thiamine  100 mg Oral Daily   sodium chloride, acetaminophen, bisacodyl, methocarbamol **OR** methocarbamol (ROBAXIN) IV, metoCLOPramide **OR** metoCLOPramide (REGLAN) injection, ondansetron **OR** ondansetron (ZOFRAN) IV, polyvinyl alcohol, senna-docusate, sodium phosphate, traMADol, traZODone  Assessment/ Plan:  [name] is a 82 y.o. white female with hypertension, atrial fibrillation, congestive heart failure, diabetes, peripheral vascular disease, hyperlipidemia and anemia who is admitted to [facility] on 01/20/2023 for Hip fracture (HCC) [S72.009A] Fall, initial encounter [W19.XXXA] Closed fracture of right hip, initial encounter (HCC) [S72.001A]  Also with COVID-19.    #1: Acute kidney injury on chronic kidney disease stage IV.  - holding furosemide - encourage PO intake   #2: Sepsis/hypotension: weaning off pressors. Now on midodrine and stress dose steroids.    #3: Diabetes mellitus type II with chronic kidney disease: continue  glucose control. Hemoglobin A1c of 6.8%   #4: Right hip fracture: Status post right intertrochanteric nail placed by Dr. Signa Kell on 01/21/23   #5: Anemia with chronic kidney disease: status post PRBC transfusion on 5/13.     LOS: 4 Deborah Obrien 5/14/20242:30 PM

## 2023-01-24 NOTE — Progress Notes (Signed)
OT Cancellation Note  Patient Details Name: Deborah Obrien MRN: [id number] DOB: 1940/10/16   Cancelled Treatment:    Reason Eval/Treat Not Completed: Patient not medically ready.  Per Dr. Karna Christmas pt not appropriate medically for participation with OT services and OK to complete OT orders at this time.  Will reassess pt pending a change in status upon receipt of new OT orders.  Arman Filter., MPH, MS, OTR/L ascom [phone] 01/24/23, 9:33 AM

## 2023-01-24 NOTE — Progress Notes (Signed)
PT Cancellation Note  Patient Details Name: Deborah Obrien MRN: [id number] DOB: 13-Apr-1941   Cancelled Treatment:    Reason Eval/Treat Not Completed: Medical issues which prohibited therapy.  Per Dr. Karna Christmas pt not appropriate medically for participation with PT services and OK to complete PT orders at this time.  Will reassess pt pending a change in status upon receipt of new PT orders.    Ovidio Hanger PT, DPT 01/24/23, 9:12 AM

## 2023-01-25 DIAGNOSIS — I5032 Chronic diastolic (congestive) heart failure: Secondary | ICD-10-CM | POA: Diagnosis not present

## 2023-01-25 DIAGNOSIS — Z515 Encounter for palliative care: Secondary | ICD-10-CM | POA: Diagnosis not present

## 2023-01-25 DIAGNOSIS — R579 Shock, unspecified: Secondary | ICD-10-CM | POA: Diagnosis not present

## 2023-01-25 LAB — COMPREHENSIVE METABOLIC PANEL
ALT: 12 U/L (ref 0–44)
AST: 39 U/L (ref 15–41)
Albumin: 2.6 g/dL — ABNORMAL LOW (ref 3.5–5.0)
Alkaline Phosphatase: 144 U/L — ABNORMAL HIGH (ref 38–126)
Anion gap: 6 (ref 5–15)
BUN: 45 mg/dL — ABNORMAL HIGH (ref 8–23)
CO2: 24 mmol/L (ref 22–32)
Calcium: 8.5 mg/dL — ABNORMAL LOW (ref 8.9–10.3)
Chloride: 108 mmol/L (ref 98–111)
Creatinine, Ser: 1.39 mg/dL — ABNORMAL HIGH (ref 0.44–1.00)
GFR, Estimated: 38 mL/min — ABNORMAL LOW (ref >60)
Glucose, Bld: 238 mg/dL — ABNORMAL HIGH (ref 70–99)
Potassium: 4 mmol/L (ref 3.5–5.1)
Sodium: 138 mmol/L (ref 135–145)
Total Bilirubin: 1.1 mg/dL (ref 0.3–1.2)
Total Protein: 5.2 g/dL — ABNORMAL LOW (ref 6.5–8.1)

## 2023-01-25 LAB — GLUCOSE, CAPILLARY
Glucose-Capillary: 209 mg/dL — ABNORMAL HIGH (ref 70–99)
Glucose-Capillary: 180 mg/dL — ABNORMAL HIGH (ref 70–99)
Glucose-Capillary: 142 mg/dL — ABNORMAL HIGH (ref 70–99)
Glucose-Capillary: 286 mg/dL — ABNORMAL HIGH (ref 70–99)
Glucose-Capillary: 242 mg/dL — ABNORMAL HIGH (ref 70–99)

## 2023-01-25 LAB — CULTURE, BLOOD (ROUTINE X 2): Culture: NO GROWTH

## 2023-01-25 LAB — MAGNESIUM: Magnesium: 1.9 mg/dL (ref 1.7–2.4)

## 2023-01-25 LAB — HEMOGLOBIN AND HEMATOCRIT, BLOOD
HCT: 28.3 % — ABNORMAL LOW (ref 36.0–46.0)
HCT: 28.4 % — ABNORMAL LOW (ref 36.0–46.0)
Hemoglobin: 8.9 g/dL — ABNORMAL LOW (ref 12.0–15.0)
Hemoglobin: 9.3 g/dL — ABNORMAL LOW (ref 12.0–15.0)

## 2023-01-25 LAB — PHOSPHORUS: Phosphorus: 2.8 mg/dL (ref 2.5–4.6)

## 2023-01-25 MED ORDER — INSULIN GLARGINE-YFGN 100 UNIT/ML ~~LOC~~ SOLN
12.0000 [IU] | Freq: Every day | SUBCUTANEOUS | Status: DC
  Administered 2023-01-25: 12 [IU] via SUBCUTANEOUS
  Filled 2023-01-25: qty 0.12

## 2023-01-25 NOTE — Evaluation (Signed)
Physical Therapy Evaluation Patient Details Name: Deborah Obrien MRN: [id number] DOB: 1941-08-30 Today's Date: 01/25/2023  History of Present Illness  Pt admitted for shock secondary to R hip fracture and is s/p R hip IM nailing. Currently POD 4 at day of evaluation. Cleared to work with therapy per medical team. History includes pacemaker, Afib, HTN, HLD, CAD and stage 4 kidney disease.  Clinical Impression  Co-evaluation performed with OT this date. Pt is a pleasant 82 year old female who was admitted for R hip fracture/shock and is POD 4 for IM nailing. Pt performs bed mobility, transfers, and ambulation with min assist and RW. Pt demonstrates deficits with strength/mobility/pain. Would benefit from skilled PT to address above deficits and promote optimal return to PLOF. Pt will continue to receive skilled PT services while admitted and will defer to TOC/care team for updates regarding disposition planning.      Recommendations for follow up therapy are one component of a multi-disciplinary discharge planning process, led by the attending physician.  Recommendations may be updated based on patient status, additional functional criteria and insurance authorization.  Follow Up Recommendations Can patient physically be transported by private vehicle: Yes     Assistance Recommended at Discharge Intermittent Supervision/Assistance  Patient can return home with the following  A lot of help with walking and/or transfers;A lot of help with bathing/dressing/bathroom;Help with stairs or ramp for entrance    Equipment Recommendations Rolling walker (2 wheels)  Recommendations for Other Services       Functional Status Assessment Patient has had a recent decline in their functional status and demonstrates the ability to make significant improvements in function in a reasonable and predictable amount of time.     Precautions / Restrictions Precautions Precautions: Fall Precaution Comments:  airborne/contact precautions 2/2 Covid+ Restrictions Weight Bearing Restrictions: Yes RLE Weight Bearing: Weight bearing as tolerated      Mobility  Bed Mobility Overal bed mobility: Needs Assistance Bed Mobility: Supine to Sit     Supine to sit: Min assist     General bed mobility comments: cues for sequencing. Pt eager for mobility efforts    Transfers Overall transfer level: Needs assistance Equipment used: Rolling walker (2 wheels) Transfers: Sit to/from Stand Sit to Stand: Min assist           General transfer comment: follows commands well. Able to place adequate weight through R LE.    Ambulation/Gait Ambulation/Gait assistance: Min assist Gait Distance (Feet): 15 Feet Assistive device: Rolling walker (2 wheels) Gait Pattern/deviations: Step-to pattern       General Gait Details: ambulated around room with +2 for safety. Pt becomes SOB during exertion and slightly dizzy. Vitals assessed. 1 standing rest break required.  Stairs            Wheelchair Mobility    Modified Rankin (Stroke Patients Only)       Balance Overall balance assessment: Needs assistance, History of Falls Sitting-balance support: Feet supported, Bilateral upper extremity supported Sitting balance-Leahy Scale: Good     Standing balance support: Bilateral upper extremity supported Standing balance-Leahy Scale: Fair                               Pertinent Vitals/Pain Pain Assessment Pain Assessment: 0-10 Pain Score: 4  Pain Location: R hip while seated 4/10; 5/10 when walking. Pain Descriptors / Indicators: Aching Pain Intervention(s): Limited activity within patient's tolerance    Home Living Family/patient  expects to be discharged to:: Assisted living                 Home Equipment: Rollator (4 wheels);Cane - quad;Wheelchair - manual;Grab bars - tub/shower;Grab bars - toilet;Shower seat - built in Additional Comments: [employer]. Husband has  dementia; lives at a different facility.    Prior Function Prior Level of Function : Independent/Modified Independent;History of Falls (last six months)             Mobility Comments: Ambulates with rollator (uses w/c for longer distances); h/o falls; last fall prior to recent one is Sept 2023 with broken L arm. ADLs Comments: (I) ADLs; goes to dining room for dinner. Enjoys [employer] account planner, Bible study, exercise group, and other social activities at the ALF. Does not drive at baseline.     Hand Dominance   Dominant Hand: Right    Extremity/Trunk Assessment   Upper Extremity Assessment Upper Extremity Assessment: Overall WFL for tasks assessed    Lower Extremity Assessment Lower Extremity Assessment: Generalized weakness (R LE grossly 3+/5; L LE grossly 4/5)    Cervical / Trunk Assessment Cervical / Trunk Assessment: Normal  Communication   Communication: No difficulties  Cognition Arousal/Alertness: Awake/alert Behavior During Therapy: WFL for tasks assessed/performed Overall Cognitive Status: Within Functional Limits for tasks assessed                                 General Comments: Pleasant and motivated.        General Comments General comments (skin integrity, edema, etc.): BP at EOB 109/57. Walked approx 8 feet, then felt dizzy; stood for a moment, then returned to seated position in recliner. BP in recliner with BIL LE elevated 3 minutes after mobility 133/66. Pt feeling better once in chair.    Exercises Other Exercises Other Exercises: Seated at EOB and able to perform LAQ x 8-10 reps with supervision.   Assessment/Plan    PT Assessment Patient needs continued PT services  PT Problem List Decreased strength;Decreased balance;Decreased mobility;Pain       PT Treatment Interventions DME instruction;Gait training;Stair training;Therapeutic exercise;Balance training    PT Goals (Current goals can be found in the Care Plan section)  Acute  Rehab PT Goals Patient Stated Goal: to get some rehab PT Goal Formulation: With patient Time For Goal Achievement: 02/08/23 Potential to Achieve Goals: Good    Frequency 7X/week     Co-evaluation PT/OT/SLP Co-Evaluation/Treatment: Yes Reason for Co-Treatment: For patient/therapist safety PT goals addressed during session: Mobility/safety with mobility OT goals addressed during session: ADL's and self-care       AM-PAC PT "6 Clicks" Mobility  Outcome Measure Help needed turning from your back to your side while in a flat bed without using bedrails?: A Little Help needed moving from lying on your back to sitting on the side of a flat bed without using bedrails?: A Little Help needed moving to and from a bed to a chair (including a wheelchair)?: A Little Help needed standing up from a chair using your arms (e.g., wheelchair or bedside chair)?: A Little Help needed to walk in hospital room?: A Lot Help needed climbing 3-5 steps with a railing? : A Lot 6 Click Score: 16    End of Session Equipment Utilized During Treatment: Gait belt Activity Tolerance: Patient tolerated treatment well Patient left: in chair Nurse Communication: Mobility status PT Visit Diagnosis: Unsteadiness on feet (R26.81);Muscle weakness (generalized) (M62.81);History  of falling (Z91.81);Difficulty in walking, not elsewhere classified (R26.2);Pain Pain - Right/Left: Right Pain - part of body: Hip    Time: 1610-9604 PT Time Calculation (min) (ACUTE ONLY): 35 min   Charges:   PT Evaluation $PT Eval Moderate Complexity: 1 Mod PT Treatments $Gait Training: 8-22 mins        Elizabeth Palau, PT, DPT, GCS [phone]   Deborah Obrien 01/25/2023, 11:31 AM

## 2023-01-25 NOTE — [place] FL2 (Signed)
[place] MEDICAID [id number] LEVEL OF CARE FORM     IDENTIFICATION  Patient Name: Deborah Obrien Birthdate: [birth year] Sex: female Admission Date (Current Location): 01/20/2023  [facility] and [place] Number:  Chiropodist and Address:  [facility], [address], [place], [place] [address]      Provider Number: [id number]  Attending Physician Name and Address:  Enedina Finner, MD  Relative Name and Phone Number:  Caryn Bee (son)  [phone]    Current Level of Care: Hospital Recommended Level of Care: Skilled Nursing Facility Prior Approval Number:    Date Approved/Denied:   PASRR Number: [id number]  Discharge Plan: SNF    Current Diagnoses: Patient Active Problem List   Diagnosis Date Noted   Paroxysmal atrial fibrillation (HCC) 01/24/2023   Pulmonary hypertension (HCC) 01/22/2023   Metabolic acidosis 01/22/2023   Fall 01/22/2023   Transaminitis 01/21/2023   Hypotension after procedure 01/21/2023   Shock (HCC) 01/21/2023   Closed fracture of right hip (HCC) 01/21/2023   Nausea 01/21/2023   Chronic heart failure with preserved ejection fraction (HCC) 01/01/2023   Restless leg syndrome 05/31/2022   Hypothyroidism 05/30/2022   Acute renal failure superimposed on stage 4 chronic kidney disease (HCC) 05/30/2022   Drop in hemoglobin 05/30/2022   Elevated liver function tests 05/30/2022   History of gout 05/30/2022   Humerus fracture 05/29/2022   Abnormal urinalysis 03/05/2022   Hypoglycemia 03/04/2022   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/02/2022   Osteoporosis 02/13/2020   Primary osteoarthritis of left shoulder 11/10/2019   Uses walker 09/27/2018   UTI (urinary tract infection) 06/14/2018   Chronic anticoagulation 04/09/2018   Cardiac pacemaker in situ 04/09/2018   H/O TIA (transient ischemic attack) and stroke 04/09/2018   CAD (coronary artery disease) 04/09/2018   Sinus node dysfunction (HCC)  08/24/2017   Dizziness 06/12/2017   Nephrolithiasis 02/22/2016   Diabetic neuropathy with neurologic complication (HCC) 11/30/2015   Recurrent major depressive disorder, in remission (HCC) 08/11/2015   Polyp of stomach and duodenum 07/29/2015   Gastric polyp    Gastro-esophageal reflux disease without esophagitis    Permanent atrial fibrillation (HCC) 06/29/2015   Intestinal obstruction (HCC) 02/20/2015   DDD (degenerative disc disease), lumbar 01/19/2015   Back pain 01/12/2015   Gout 01/12/2015   Intervertebral disc disorder with radiculopathy of lumbar region 09/17/2014   Lumbar radiculitis 09/17/2014   Lumbar stenosis with neurogenic claudication 09/17/2014   Neuritis or radiculitis due to rupture of lumbar intervertebral disc 09/17/2014   Atherosclerosis of both carotid arteries 07/15/2014   Hyperlipemia, mixed 07/15/2014   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   Bilateral carotid artery disease (HCC) 07/15/2014   Body mass index (BMI) of 40.0-44.9 in adult [facility]) 06/21/2014   Mixed urge and stress incontinence 06/25/2013   Nocturia 06/25/2013   Blurring of visual image 04/11/2012   Other visual disturbances 04/11/2012    Orientation RESPIRATION BLADDER Height & Weight     Self, Time, Situation, Place  Normal Incontinent, External catheter Weight: 173 lb 1 oz (78.5 kg) Height:  5\' 3"  (160 cm)  BEHAVIORAL SYMPTOMS/MOOD NEUROLOGICAL BOWEL NUTRITION STATUS      Continent Diet (see discharge summary)  AMBULATORY STATUS COMMUNICATION OF NEEDS Skin   Limited Assist Verbally Other (Comment) (right hip closed incision)                       Personal Care Assistance Level of Assistance  Bathing, Feeding, Dressing, Total care  Bathing Assistance: Limited assistance Feeding assistance: Independent Dressing Assistance: Limited assistance Total Care Assistance: Limited assistance   Functional Limitations Info  Sight, Hearing, Speech Sight Info: Adequate Hearing Info:  Adequate Speech Info: Adequate    SPECIAL CARE FACTORS FREQUENCY  PT (By licensed PT), OT (By licensed OT)     PT Frequency: min 4x weekly OT Frequency: min 4x weekly            Contractures Contractures Info: Not present    Additional Factors Info  Code Status, Allergies Code Status Info: full Allergies Info: Cephalosporins  Fish Allergy  Macrolides And Ketolides  Meperidine  Other  Prednisone  Shellfish Allergy  Sulfa Antibiotics  Sulfacetamide Sodium  Telbivudine  Uloric (Febuxostat)  Aspirin  Celecoxib  Cephalexin  Codeine  Dilaudid (Hydromorphone Hcl)  Erythromycin  Iodinated Contrast Media  Lipitor (Atorvastatin Calcium)  Oxycodone  Aleve (Naproxen)  Atorvastatin  Doxycycline  Iodine  Motrin (Ibuprofen)  Tape  Valdecoxib           Current Medications (01/25/2023):  This is the current hospital active medication list Current Facility-Administered Medications  Medication Dose Route Frequency Provider Last Rate Last Admin   0.9 %  sodium chloride infusion   Intravenous PRN Ezequiel Essex, NP   Stopped at 01/24/23 0612   acetaminophen (TYLENOL) tablet 325-650 mg  325-650 mg Oral Q6H PRN Signa Kell, MD   650 mg at 01/24/23 2152   allopurinol (ZYLOPRIM) tablet 100 mg  100 mg Oral Daily Signa Kell, MD   100 mg at 01/25/23 0831   amiodarone (PACERONE) tablet 200 mg  200 mg Oral BID Vida Rigger, MD   200 mg at 01/25/23 0830   apixaban (ELIQUIS) tablet 2.5 mg  2.5 mg Oral BID Vida Rigger, MD   2.5 mg at 01/25/23 0981   bisacodyl (DULCOLAX) suppository 10 mg  10 mg Rectal Daily PRN Signa Kell, MD       Chlorhexidine Gluconate Cloth 2 % PADS 6 each  6 each Topical Daily Raechel Chute, MD   6 each at 01/25/23 1914   cyanocobalamin (VITAMIN B12) tablet 1,000 mcg  1,000 mcg Oral Daily Signa Kell, MD   1,000 mcg at 01/25/23 0831   docusate sodium (COLACE) capsule 100 mg  100 mg Oral BID Signa Kell, MD   100 mg at 01/25/23 0829   famotidine (PEPCID) tablet 20 mg  20 mg  Oral QHS Tressie Ellis, RPH   20 mg at 01/24/23 2152   feeding supplement (ENSURE ENLIVE / ENSURE PLUS) liquid 237 mL  237 mL Oral BID BM Vida Rigger, MD   237 mL at 01/25/23 1337   folic acid (FOLVITE) tablet 1 mg  1 mg Oral Daily Tressie Ellis, RPH   1 mg at 01/25/23 0831   gabapentin (NEURONTIN) capsule 100 mg  100 mg Oral BID Signa Kell, MD   100 mg at 01/25/23 0830   insulin aspart (novoLOG) injection 0-20 Units  0-20 Units Subcutaneous Q4H Jimmye Norman, NP   3 Units at 01/25/23 1253   insulin glargine-yfgn (SEMGLEE) injection 12 Units  12 Units Subcutaneous QHS Enedina Finner, MD       levothyroxine (SYNTHROID) tablet 37.5 mcg  37.5 mcg Oral QAC breakfast Signa Kell, MD   37.5 mcg at 01/25/23 7829   methocarbamol (ROBAXIN) tablet 500 mg  500 mg Oral Q6H PRN Signa Kell, MD   500 mg at 01/23/23 0930   Or   methocarbamol (ROBAXIN) 500 mg in  dextrose 5 % 50 mL IVPB  500 mg Intravenous Q6H PRN Signa Kell, MD   Stopped at 01/23/23 1854   metoCLOPramide (REGLAN) tablet 5-10 mg  5-10 mg Oral Q8H PRN Signa Kell, MD       Or   metoCLOPramide (REGLAN) injection 5-10 mg  5-10 mg Intravenous Q8H PRN Signa Kell, MD       midodrine (PROAMATINE) tablet 10 mg  10 mg Oral TID WC Vida Rigger, MD   10 mg at 01/25/23 1252   multivitamin with minerals tablet 1 tablet  1 tablet Oral Daily Vida Rigger, MD   1 tablet at 01/25/23 0831   ondansetron (ZOFRAN) tablet 4 mg  4 mg Oral Q6H PRN Signa Kell, MD       Or   ondansetron [facility]) injection 4 mg  4 mg Intravenous Q6H PRN Signa Kell, MD       polyvinyl alcohol (LIQUIFILM TEARS) 1.4 % ophthalmic solution 1 drop  1 drop Both Eyes BID PRN Signa Kell, MD       rOPINIRole (REQUIP) tablet 0.25 mg  0.25 mg Oral QHS Signa Kell, MD   0.25 mg at 01/24/23 2152   senna-docusate (Senokot-S) tablet 1 tablet  1 tablet Oral QHS PRN Signa Kell, MD   1 tablet at 01/24/23 2153   sertraline (ZOLOFT) tablet 25 mg  25 mg Oral Daily  Signa Kell, MD   25 mg at 01/25/23 0830   sertraline (ZOLOFT) tablet 50 mg  50 mg Oral Daily Signa Kell, MD   50 mg at 01/25/23 4098   sodium phosphate (FLEET) 7-19 GM/118ML enema 1 enema  1 enema Rectal Once PRN Signa Kell, MD       thiamine (VITAMIN B1) tablet 100 mg  100 mg Oral Daily Ezequiel Essex, NP   100 mg at 01/25/23 0830   traMADol (ULTRAM) tablet 50 mg  50 mg Oral Q6H PRN Dedra Skeens, PA-C   50 mg at 01/25/23 0830   traZODone (DESYREL) tablet 25 mg  25 mg Oral BID PRN Signa Kell, MD   25 mg at 01/23/23 2211     Discharge Medications: Please see discharge summary for a list of discharge medications.  Relevant Imaging Results:  Relevant Lab Results:   Additional Information SSN: [id number]  Darolyn Rua, LCSW

## 2023-01-25 NOTE — Progress Notes (Signed)
Palliative Care Progress Note, Assessment & Plan   Patient Name: Deborah Obrien       Date: 01/25/2023 DOB: 01-24-1941  Age: 82 y.o. MRN#: [id number] Attending Physician: Enedina Finner, MD Primary Care Physician: Melonie Florida, FNP Admit Date: 01/20/2023  Subjective: Patient is sitting out of bed in recliner.  She acknowledges my presence and is able make her wishes known.  She has no acute complaints at this time.  No family or friends present during my visit.  HPI: 82 y.o. female  with past medical history of paroxsymal atrial fibrillation on Eliquis, sinus node dysfunction s/p pacemaker placement, HFpEF, nonobstructive CAD, pulmonary HTN, CKD stage IV, T2DM, TIA, anxiety, depression, RLS and hypothyroidism admitted from [facility] on 01/20/2023 following a mechanical fall resulting in right hip fracture.   Underwent IM nailing of right femur with cephalomedullary device on 5/11 Post op complications including hypotension requiring vasopressor support, acute hypercapnic respiratory failure requiring bipap therapy and encephalopathy    Incidentally found to be COVID (+) 5/12   Concern due to worsening renal function with underlying CKD stage IV   Concern for possible PE or fat embolus-unable to obtain CTA due to renal function   Palliative medicine was consulted for assisting with code status and goals of care conversations.  Summary of counseling/coordination of care: After reviewing the patient's chart and assessing the patient at bedside, I spoke with attending Dr. Allena Katz in regards to plan and goals of care.  Patient goals are clear-full code and full scope.  Symptom burden is low.  No adjustments to medications needed at this time.  TOC following closely for discharge.  As per patient,  she is still trying to decide whether she wants to go to [facility] to be near her husband who is a resident there or to go to [facility] where she was previously living.  TOC following closely for discharge planning.  Patient/family have PMT contact information and were encouraged to contact PMT with any future acute palliative needs.   Attending aware and in agreement for PMT to monitor the patient peripherally and shadow her chart. Please re-engage with PMT if goals change, at patient/family's request, or if patient's health deteriorates during hospitalization.    Physical Exam Constitutional:      General: She is not in acute distress.    Appearance: She is normal weight.  HENT:     Head: Normocephalic.     Mouth/Throat:     Mouth: Mucous membranes are moist.  Eyes:     Pupils: Pupils are equal, round, and reactive to light.  Cardiovascular:     Rate and Rhythm: Normal rate.  Pulmonary:     Effort: Pulmonary effort is normal.  Musculoskeletal:     Comments: Generalized weakness  Skin:    General: Skin is warm and dry.     Coloration: Skin is pale.  Neurological:     Mental Status: She is alert and oriented to person, place, and time.  Psychiatric:        Mood and Affect: Mood normal.        Behavior: Behavior normal.        Thought Content: Thought content  normal.        Judgment: Judgment normal.             Total Time 25 minutes   Evamarie Raetz L. Manon Hilding, FNP-BC Palliative Medicine Team Team Phone # [phone]

## 2023-01-25 NOTE — Progress Notes (Signed)
NAME:  ANTRONETTE SUGUITAN, MRN:  [id number], DOB:  10-29-1940, LOS: 5 ADMISSION DATE:  01/20/2023, CONSULTATION DATE: 01/21/2023 REFERRING MD: Dr. Laural Benes, CHIEF COMPLAINT: Hypotension    History of Present Illness:  This is an 82 yo female who presented to [facility] ER on 05/10 via EMS from [facility] with right hip pain after tripping and having a witness fall.  She denied hitting her head or LOC.  Pt does take eliquis for atrial fibrillation.  At baseline she ambulates with a walker.    ED Course  Upon arrival to the ER pt hypotensive with maps in the low 60's.  Right hip x-ray revealed comminuted intertrochanteric right hip fracture, with impaction and varus angulation at the fracture site.  Orthopedic Surgeon Dr. Allena Katz consulted, and pt admitted to the [facility] unit per hospitalist team pending right hip cephalomedullary nailing scheduled for 05/11.     01/23/23-patient s/p evaluation by cardio/renal/ortho.  Remains on levophed.  Decision to start eliquis low dose 2.5 per cardio/ortho today.  Monitoring H/h will transfuse 1 unit today.  Mentation seems to be improving some with periods of confusion.  PT/OT after transfusion.  Taking PO well have increased her midodrine to 10 tid.   01/24/23- patient is off vasopressors today.  We did place her on 10 of midodrine and she has baseline hypotension.  She has oozing of blood from surgical site she's on eliquis 2.5 bid.  PT was not comfortable working with her today.  We will refine her therapy today and try to get her more stable for OOB.    01/25/23- patient is improved,  stable condition.  TRH has assumed care of patient and we can sign off today.  Pertinent  Medical History  Paroxysmal Atrial Fibrillation on eliquis  GERD  Anemia  Anxiety Bowel Obstruction  CAD  Stage IV CKD Depression  Type II Diabetes Mellitus Gastritis  GI Bleed  HTN  Hypothyroidism  Chronic Back Pain  Morbid Obesity  Permanent Cardiac Pacermaker  Sinus Node  Dysfunction  Stroke  TIA   Significant Hospital Events: Including procedures, antibiotic start and stop dates in addition to other pertinent events   05/10: Pt admitted to the [facility] unit with a right intertrochanteric hip fracture following a mechanical fall  05/11: Pt underwent intramedullary nailing of the right femur with cephalomedullary device. Postop pt hypotensive requiring levophed gtt. Pt also developed acute hypercapnic respiratory failure requiring Bipap. PCCM team consulted to assist with management 05/12: No acute events overnight.  Pt pending Cardiology and Nephrology consults  01/23/23- no distress this am, remains hypotensive of 91mcg/kg/min levophed support.  Plan to rehydrate with more IVF today and wean off vasopressors. COVID is mild and stable on room air, will dc remdesevir  Interim History / Subjective:  Pt transitioned off Bipap this am tolerating 2L O2 via nasal canula.  Remains on levophed gtt @21  mcg/min to maintain map 60 or higher will add vasopressin.    Objective   Blood pressure (!) 105/47, pulse 61, temperature 98.2 F (36.8 C), temperature source Axillary, resp. rate 14, height 5\' 3"  (1.6 m), weight 78.5 kg, SpO2 97 %.        Intake/Output Summary (Last 24 hours) at 01/25/2023 0830 Last data filed at 01/25/2023 0600 Gross per 24 hour  Intake 852.08 ml  Output 845 ml  Net 7.08 ml    Filed Weights   01/20/23 1310 01/21/23 2100 01/24/23 0500  Weight: 72 kg 70.1 kg 78.5 kg  Examination: General: Acute on chronically-ill appearing female, NAD on 2L O2  HENT: Supple, no JVD  Lungs: Clear throughout, even, non labored  Cardiovascular: Atrial paced, hr 70, no m/r/g, 2+ radial/1+ distal pulses, trace right hip edema  Abdomen: +BS x4, soft, non tender, non distended  Extremities: Right hip fracture post repair, normal bulk  Skin: Right hip incision with honeycomb dressing dry/intact  Neuro: Slightly lethargic, oriented, following commands, PERRLA  GU:  Indwelling foley catheter draining clear yellow urine   Resolved Hospital Problem list    Assessment & Plan:  #Acute metabolic encephalopathy~improving   - Correct metabolic derangements - Avoid sedating medication when able  - Frequent reorientation   #Shock: cardiogenic and hypovolemic; possible adrenal insufficiency  #HFpEF #Pulmonary HTN  Hx: Paroxysmal atrial fibrillation, permanent cardiac pacemaker, CAD, sinus node dysfunction  - Continuous telemetry monitoring  - Trend troponin's until peaked  - Echo pending   - TSH 4.492; free T4 1.03 - CVP 10-12 will hold on additional iv fluids for now BNP 1,487.0 - Continue scheduled midodrine and prn levophed/vasopressin gtts to maintain map >65 - Cardiology consulted appreciate input - Concern for possible PE or fat embolism unable to obtain CTA PE due to acute on chronic renal failure will DC HEPARIN AND START ELIQUIS PER CARDIO RECOMMENDATIONS  #Acute hypercapnic respiratory failure- RESOLVED  - Prn Bipap for hypercapnia and/or hypoxia  - Prn supplemental O2 for dyspnea and/or hypoxia  - Maintain O2 sats >92% - Follow CXR and ABG's  - Will r/o COVID  - Aggressive pulmonary hygiene when able   #Acute kidney injury superimposed on CKD stage IV~worsening  - Trend BMP  - Replace electrolytes as indicated  - Strict intake and output  - Avoid nephrotoxic medications  - Nephrology consulted appreciate- REVIWED PLAN WITH DR Wynelle Link  #Anemia no obvious signs of active bleeding  - Trend CBC  - Folic acid and vitamin B12 levels within normal limits  - Fibrinogen/PT/INR within normal limits  - D-dimer 2.78 - VTE px: heparin gtt  - Monitor for s/sx of bleeding  - Transfuse for hgb <8  #Transaminitis likely secondary to hypotension~improving  #Chronically elevated alk phosphatase~resolved   [place] RUQ Limited 01/21/23: fatty liver and s/p cholecystectomy  - Trend hepatic function panel  - Avoid hepatoxic medications as able    #Leukocytosis no obvious signs of infection  - Trend WBC and monitor fever curve  - Trend PCT  - Follow cultures  - Will start empiric zosyn due to elevated pct pending culture results   #Right intertrochanteric hip fracture following a mechanical fall s/p intramedullary nail intertrochanteric  #Acute pain  - Orthopedic consulted appreciate input  - 12.5 mg fentanyl x1 dose pending bedside swallowing evaluation if pt passes will continue prn oxycodone  - PT/OT once hypotension resolves  #GERD  Hx Gastritis  - IV pepcid   #Type II Diabetes Mellitus  - CBG's q4hrs  - Changed from sensitive to moderate SSI   Best Practice (right click and "Reselect all SmartList Selections" daily)   Diet/type: NPO pending bedside swallow evaluation  DVT prophylaxis: SCD GI prophylaxis: H2B Lines: Yes and still needed  Foley: Yes and still needed  Code Status:  full code Last date of multidisciplinary goals of care discussion [01/22/2023]  05/12: Pt updated at bedside regarding current plan of care.  Will also update pts son Kecia Bawden via telephone today   Labs   CBC: Recent Labs  Lab 01/20/23 1022 01/21/23 0420 01/21/23 1239 01/21/23  1936 01/22/23 0246 01/22/23 0845 01/23/23 0408 01/23/23 0410 01/23/23 0630 01/24/23 0356 01/24/23 1219 01/24/23 1749 01/25/23 0018 01/25/23 0404  WBC 5.6   < > 14.4*  --  16.1*  --  9.5 10.0  --  5.7  --   --   --   --   NEUTROABS 3.5  --  13.4*  --  13.0*  --  8.0*  --   --   --   --   --   --   --   HGB 13.0   < > 10.3*   < > 9.3*   < > 7.8* 7.6*   < > 8.4* 9.0* 9.6* 9.3* 8.9*  HCT 39.2   < > 32.5*   < > 29.8*   < > 23.9* 23.4*   < > 26.3* 27.9* 29.6* 28.4* 28.3*  MCV 97.8   < > 101.6*  --  103.8*  --  99.2 98.3  --  98.9  --   --   --   --   PLT 160   < > 172  --  143*  --  119* 102*  --  119*  --   --   --   --    < > = values in this interval not displayed.     Basic Metabolic Panel: Recent Labs  Lab 01/21/23 1239 01/22/23 0246  01/23/23 0410 01/24/23 0356 01/25/23 0404  NA 135 138 136 139 138  K 4.5 4.3 4.2 4.0 4.0  CL 108 107 104 109 108  CO2 19* 23 19* 24 24  GLUCOSE 284* 239* 216* 181* 238*  BUN 48* 51* 50* 43* 45*  CREATININE 2.48* 2.68* 2.01* 1.54* 1.39*  CALCIUM 7.5* 8.0* 8.0* 8.3* 8.5*  MG 1.7 2.2 2.2 2.0 1.9  PHOS 5.6* 6.3* 3.5 3.1 2.8    GFR: Estimated Creatinine Clearance: 30.9 mL/min (A) (by C-G formula based on SCr of 1.39 mg/dL (H)). Recent Labs  Lab 01/21/23 1239 01/21/23 1712 01/22/23 0246 01/22/23 0813 01/23/23 0408 01/23/23 0410 01/24/23 0356  PROCALCITON 2.12  --   --  2.40 1.23  --   --   WBC 14.4*  --  16.1*  --  9.5 10.0 5.7  LATICACIDVEN 1.5 1.3  --  1.3  --   --   --      Liver Function Tests: Recent Labs  Lab 01/20/23 1022 01/21/23 0420 01/21/23 1239 01/22/23 0246 01/25/23 0404  AST 72* 98* 84* 72* 39  ALT 37 61* 58* 40 12  ALKPHOS 151* 156* 133* 119 144*  BILITOT 1.7* 1.5* 1.4* 0.7 1.1  PROT 7.3 6.5 5.8* 6.1* 5.2*  ALBUMIN 4.1 3.5 3.2* 3.3* 2.6*    No results for input(s): "LIPASE", "AMYLASE" in the last 168 hours. No results for input(s): "AMMONIA" in the last 168 hours.  ABG    Component Value Date/Time   PHART 7.34 (L) 01/22/2023 1400   PCO2ART 36 01/22/2023 1400   PO2ART 102 01/22/2023 1400   HCO3 19.4 (L) 01/22/2023 1400   TCO2 24 07/15/2020 0921   ACIDBASEDEF 5.7 (H) 01/22/2023 1400   O2SAT 98.8 01/22/2023 1400     Coagulation Profile: Recent Labs  Lab 01/20/23 1045 01/21/23 1239 01/23/23 0936  INR 1.2 1.3* 1.3*     Cardiac Enzymes: Recent Labs  Lab 01/21/23 1239  CKTOTAL 268*     HbA1C: Hemoglobin A1C  Date/Time Value Ref Range Status  09/26/2012 12:00 PM 7.5 (H) 4.2 - 6.3 % Final  Comment:    The American Diabetes Association recommends that a primary goal of therapy should be <7% and that physicians should reevaluate the treatment regimen in patients with HbA1c values consistently >8%.    Hgb A1c MFr Bld   Date/Time Value Ref Range Status  01/21/2023 04:20 AM 6.8 (H) 4.8 - 5.6 % Final    Comment:    (NOTE) Pre diabetes:          5.7%-6.4%  Diabetes:              >6.4%  Glycemic control for   <7.0% adults with diabetes   05/29/2022 10:29 PM 6.2 (H) 4.8 - 5.6 % Final    Comment:    (NOTE) Pre diabetes:          5.7%-6.4%  Diabetes:              >6.4%  Glycemic control for   <7.0% adults with diabetes     CBG: Recent Labs  Lab 01/24/23 1142 01/24/23 1605 01/24/23 1926 01/24/23 2344 01/25/23 0402  GLUCAP 216* 213* 225* 236* 209*     Review of Systems: Positives in BOLD   Gen: Denies fever, chills, weight change, fatigue, night sweats HEENT: Denies blurred vision, double vision, hearing loss, tinnitus, sinus congestion, rhinorrhea, sore throat, neck stiffness, dysphagia PULM: Denies shortness of breath, cough, sputum production, hemoptysis, wheezing CV: Denies chest pain, edema, orthopnea, paroxysmal nocturnal dyspnea, palpitations GI: Denies abdominal pain, nausea, vomiting, diarrhea, hematochezia, melena, constipation, change in bowel habits GU: Denies dysuria, hematuria, polyuria, oliguria, urethral discharge MUSC: postop right hip pain Endocrine: Denies hot or cold intolerance, polyuria, polyphagia or appetite change Derm: Denies rash, dry skin, scaling or peeling skin change Heme: Denies easy bruising, bleeding, bleeding gums Neuro: Denies headache, numbness, weakness, slurred speech, loss of memory or consciousness  Past Medical History:  She,  has a past medical history of Acid reflux, Acute respiratory failure (HCC) (04/09/2018), Anemia, Anxiety, Bowel obstruction (HCC), Carotid artery disease (HCC), Chronic heart failure with preserved ejection fraction (HFpEF) (HCC), CKD (chronic kidney disease), stage IV (HCC), Closed fracture of one rib of right side (05/29/2020), Complete tear of left rotator cuff (10/16/2014), Coronary artery disease (non-obstructive),  Depression, Diabetes mellitus without complication (HCC), Diabetic neuropathy (HCC), Diarrhea (03/04/2022), Fatty liver, Gastritis without bleeding, GI bleed, Gout, Hypertension, Hypothyroidism, Low back pain, Morbid obesity (HCC), Multiple gastric polyps, Osteoarthritis, Osteoporosis, PAF (paroxysmal atrial fibrillation) (HCC) (2016), Paroxysmal atrial fibrillation (HCC), Presence of permanent cardiac pacemaker, Pulmonary hypertension (HCC), Sinus node dysfunction (HCC) (08/24/2017), Sinus node dysfunction (HCC), Tendinitis of upper biceps tendon of left shoulder (11/10/2019), TIA (transient ischemic attack), 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   Surgical History:   Past Surgical History:  Procedure Laterality Date   ABDOMINAL HYSTERECTOMY  1980   APPENDECTOMY     BACK SURGERY     2008. plate & screws    BACK SURGERY  2019   patient describes kyphoplasty for compression fractures, MD referral said fusion   BREAST BIOPSY Right 08/16   stereo fibroadenomatous change, neg for atypia   BREAST EXCISIONAL BIOPSY Left    neg   CARDIAC CATHETERIZATION Left 08/25/2016   Procedure: Left Heart Cath and Coronary Angiography;  Surgeon: Lamar Blinks, MD;  Location: [facility] INVASIVE CV LAB;  Service: Cardiovascular;  Laterality: Left;   CARDIOVERSION N/A 06/28/2017   Procedure: CARDIOVERSION;  Surgeon: Gwen Pounds,  Quentin Cornwall, MD;  Location: [facility] ORS;  Service: Cardiovascular;  Laterality: N/A;   CARDIOVERSION N/A 02/06/2018   Procedure: CARDIOVERSION;  Surgeon: Wendall Stade, MD;  Location: [facility] ENDOSCOPY;  Service: Cardiovascular;  Laterality: N/A;   CARDIOVERSION N/A 07/15/2020   Procedure: CARDIOVERSION;  Surgeon: Jake Bathe, MD;  Location: [facility] ENDOSCOPY;  Service: Cardiovascular;  Laterality: N/A;   CATARACT EXTRACTION, BILATERAL  2012    CHOLECYSTECTOMY     colon blockage  1999   COLON SURGERY  2000   removed 20% of colon. colon had collapsed   COLONOSCOPY  2016   polyps removed 2016   DORSAL COMPARTMENT RELEASE Left 09/14/2016   Procedure: RELEASE DORSAL COMPARTMENT (DEQUERVAIN);  Surgeon: Donato Heinz, MD;  Location: [facility] ORS;  Service: Orthopedics;  Laterality: Left;   ESOPHAGOGASTRODUODENOSCOPY N/A 01/27/2015   Procedure: ESOPHAGOGASTRODUODENOSCOPY (EGD);  Surgeon: Christena Deem, MD;  Location: [facility] ENDOSCOPY;  Service: Endoscopy;  Laterality: N/A;   ESOPHAGOGASTRODUODENOSCOPY (EGD) WITH PROPOFOL N/A 07/24/2015   Procedure: ESOPHAGOGASTRODUODENOSCOPY (EGD) WITH PROPOFOL;  Surgeon: Midge Minium, MD;  Location: [facility] ENDOSCOPY;  Service: Endoscopy;  Laterality: N/A;   ESOPHAGOGASTRODUODENOSCOPY (EGD) WITH PROPOFOL N/A 04/12/2018   Procedure: ESOPHAGOGASTRODUODENOSCOPY (EGD) WITH PROPOFOL;  Surgeon: Wyline Mood, MD;  Location: [facility] END[facility]COPY;  Service: Gastroenterology;  Laterality: N/A;   ESOPHAGOGASTRODUODENOSCOPY (EGD) WITH PROPOFOL N/A 06/22/2018   Procedure: ESOPHAGOGASTRODUODENOSCOPY (EGD) WITH PROPOFOL;  Surgeon: Rachael Fee, MD;  Location: [facility] ENDOSCOPY;  Service: Endoscopy;  Laterality: N/A;   ESOPHAGOGASTRODUODENOSCOPY (EGD) WITH PROPOFOL N/A 07/09/2018   Procedure: ESOPHAGOGASTRODUODENOSCOPY (EGD) WITH PROPOFOL with resection of gastric polyps;  Surgeon: Wyline Mood, MD;  Location: [facility] ENDOSCOPY;  Service: Gastroenterology;  Laterality: N/A;   ESOPHAGOGASTRODUODENOSCOPY (EGD) WITH PROPOFOL N/A 05/17/2019   Procedure: ESOPHAGOGASTRODUODENOSCOPY (EGD) WITH PROPOFOL;  Surgeon: Wyline Mood, MD;  Location: [facility] ENDOSCOPY;  Service: Gastroenterology;  Laterality: N/A;   EUS N/A 06/22/2018   Procedure: UPPER ENDOSCOPIC ULTRASOUND (EUS) RADIAL;  Surgeon: Rachael Fee, MD;  Location: [facility] ENDOSCOPY;  Service: Endoscopy;  Laterality: N/A;   EYE SURGERY Bilateral    cataract extractions   HUMERUS IM NAIL Left 06/02/2022    Procedure: INTRAMEDULLARY (IM) NAIL HUMERAL;  Surgeon: Christena Flake, MD;  Location: [facility] ORS;  Service: Orthopedics;  Laterality: Left;   INTRAMEDULLARY (IM) NAIL INTERTROCHANTERIC Right 01/21/2023   Procedure: INTRAMEDULLARY (IM) NAIL INTERTROCHANTERIC;  Surgeon: Signa Kell, MD;  Location: [facility] ORS;  Service: Orthopedics;  Laterality: Right;   JOINT REPLACEMENT Bilateral 2014   Bilateral Knee replacement   LEAD REVISION/REPAIR N/A 08/29/2017   Procedure: LEAD REVISION/REPAIR;  Surgeon: Regan Lemming, MD;  Location: [facility] INVASIVE CV LAB;  Service: Cardiovascular;  Laterality: N/A;   OOPHORECTOMY     PACEMAKER INSERTION Left 07/01/2015   Procedure: INSERTION PACEMAKER;  Surgeon: Marcina Millard, MD;  Location: [facility] ORS;  Service: Cardiovascular;  Laterality: Left;   PACEMAKER REVISION N/A 08/28/2017   Procedure: PACEMAKER REVISION;  Surgeon: Duke Salvia, MD;  Location: [facility] INVASIVE CV LAB;  Service: Cardiovascular;  Laterality: N/A;   REPLACEMENT TOTAL KNEE BILATERAL     SHOULDER ARTHROSCOPY WITH ROTATOR CUFF REPAIR AND OPEN BICEPS TENODESIS Left 11/21/2019   Procedure: LEFT SHOULDER ARTHROSCOPY WITH DEBRIDEMENT, DECOMPRESSION, ROTATOR CUFF REPAIR AND BICEPS TENOLYSIS;  Surgeon: Christena Flake, MD;  Location: [facility] ORS;  Service: Orthopedics;  Laterality: Left;   SHOULDER ARTHROSCOPY WITH SUBACROMIAL DECOMPRESSION Left 2013   TEE WITHOUT CARDIOVERSION N/A 06/28/2017   Procedure: TRANSESOPHAGEAL ECHOCARDIOGRAM (TEE);  Surgeon: Lamar Blinks, MD;  Location: [facility] ORS;  Service: Cardiovascular;  Laterality: N/A;     Social History:   reports that she has never smoked. She has never used smokeless tobacco. She reports that she does not drink alcohol and does not use drugs.   Family History:  Her family history includes CAD in her mother; Cancer in her father; Diabetes Mellitus II in her brother and mother; Heart attack in her mother; Stroke in her brother. There is no history of  Breast cancer.   Allergies Allergies  Allergen Reactions   Cephalosporins Other (See Comments), Diarrhea and Nausea Only    Reaction:  Unknown  Other reaction(s): Other (see comments) Other reaction(s): Other (See Comments) Other Reaction: Intolerance  Reaction:  Stomach pain  Other Reaction: Intolerance Reaction:  Unknown    Fish Allergy Shortness Of Breath    All kinds of fish. Severe vomitting even with smell of fish.    Macrolides And Ketolides Other (See Comments)    Severe stomach pain (mycins)   Meperidine Shortness Of Breath, Nausea Only and Other (See Comments)    Stomach pain    Other Nausea Only, Swelling, Rash, Anaphylaxis, Diarrhea, Shortness Of Breath and Other (See Comments)    Allergen: NON-STEROIDS, bextra - hands and feet swell  Reaction:  Stomach pain   Other Reaction: Intolerance   Prednisone Anaphylaxis and Other (See Comments)    (facial swelling/redness/burning)Increases pts blood sugar  Pt states that she is allergic to all steroids.     Shellfish Allergy Anaphylaxis, Shortness Of Breath, Diarrhea, Nausea Only and Rash    Stomach pain    Sulfa Antibiotics Shortness Of Breath, Diarrhea, Nausea Only and Other (See Comments)    Reaction:  Stomach pain   Reaction:  Stomach pain  Other Reaction: Intolerance   Sulfacetamide Sodium Diarrhea, Nausea Only, Other (See Comments) and Shortness Of Breath    Reaction:  Stomach pain    Telbivudine Other (See Comments)    Unknown reaction   Uloric [Febuxostat] Anaphylaxis    Locks pt's body up    Aspirin Rash and Other (See Comments)    Stomach pain (tolerates lower doses)   Celecoxib Other (See Comments)    GI bleed, weakness, and stomach pain.     Cephalexin Diarrhea, Nausea Only and Other (See Comments)    stomach pain    Codeine Diarrhea, Nausea Only and Other (See Comments)    Reaction:  Stomach pain Pt tolerates morphine    Dilaudid [Hydromorphone Hcl] Other (See Comments)    Reactions: easy to  overdose - blood pressure drops really low   Erythromycin Diarrhea, Nausea Only and Other (See Comments)    Reaction:  Stomach pain    Iodinated Contrast Media Rash and Other (See Comments)    Pt states that she is unable to have because she has chronic kidney disease.   CKD Pt states that she is unable to have because she has chronic kidney disease.   CKD    Lipitor [Atorvastatin Calcium] Nausea Only    Reaction: nausea, weakness, pass blood   Oxycodone Other (See Comments)    Reaction:  Stomach pain    Aleve [Naproxen]     Reaction: severe stomach pain   Atorvastatin Nausea Only and Other (See Comments)    Weakness    Doxycycline     Stomach pain    Iodine Rash   Motrin [Ibuprofen]     Reaction: severe stomach pain   Tape Other (See Comments)    Causes pts  skin to tear  Pt states that she is able to use paper tape.       Valdecoxib Swelling and Other (See Comments)    Pt states that her hands and feet swell.       Home Medications  Prior to Admission medications   Medication Sig Start Date End Date Taking? Authorizing Provider  acetaminophen (TYLENOL) 325 MG tablet Take 2 tablets (650 mg total) by mouth every 6 (six) hours as needed for mild pain, moderate pain or fever (or Fever >/= 101). Patient taking differently: Take 325 mg by mouth every 6 (six) hours as needed for mild pain, moderate pain or fever (or Fever >/= 101). 06/08/22  Yes Lurene Shadow, MD  acetaminophen (TYLENOL) 500 MG tablet Take 500 mg by mouth in the morning, at noon, and at bedtime.   Yes [provider]  albuterol (VENTOLIN HFA) 108 (90 Base) MCG/ACT inhaler Inhale 2 puffs into the lungs every 6 (six) hours as needed for wheezing or shortness of breath. Ventolin   Yes [provider]  allopurinol (ZYLOPRIM) 100 MG tablet Take 100 mg by mouth daily.   Yes [provider]  amiodarone (PACERONE) 200 MG tablet TAKE 1 TABLET BY MOUTH ONCE DAILY . APPOINTMENT REQUIRED FOR FUTURE  REFILLS 02/14/22  Yes Duke Salvia, MD  apixaban (ELIQUIS) 2.5 MG TABS tablet Take 1 tablet (2.5 mg total) by mouth 2 (two) times daily. 03/06/22  Yes Sunnie Nielsen, DO  ciprofloxacin (CIPRO) 500 MG tablet Take 500 mg by mouth daily. 05/07 - 05/14   Yes [provider]  docusate sodium (COLACE) 100 MG capsule Take 100 mg by mouth 2 (two) times daily.   Yes [provider]  famotidine (PEPCID) 10 MG tablet Take 10 mg by mouth daily.   Yes [provider]  Fluticasone Furoate (FLONASE SENSIMIST NA) Place 1 spray into the nose daily.   Yes [provider]  furosemide (LASIX) 40 MG tablet Take 40 mg by mouth daily.   Yes [provider]  gabapentin (NEURONTIN) 100 MG capsule Take 100 mg by mouth 2 (two) times daily. 05/07 - 05/11   Yes [provider]  HUMALOG 100 UNIT/ML injection Inject 0-12 Units into the skin 3 (three) times daily with meals. Sliding scale 05/08/22  Yes [provider]  LANTUS 100 UNIT/ML injection 16 Units at bedtime. 05/10/22  Yes [provider]  Levothyroxine Sodium 37.5 MCG CAPS Take 37.5 mcg by mouth daily before breakfast. 01/05/23  Yes Duke Salvia, MD  midodrine (PROAMATINE) 5 MG tablet Take 5 mg by mouth 3 (three) times daily with meals. 01/18/23 07/17/23 Yes [provider]  oxybutynin (DITROPAN) 5 MG tablet Take 5 mg by mouth 3 (three) times daily. Breakfast & bedtime   Yes [provider]  Polyethyl Glycol-Propyl Glycol 0.4-0.3 % SOLN Place 1 drop into both eyes 2 (two) times daily as needed (dry eyes).   Yes [provider]  pravastatin (PRAVACHOL) 20 MG tablet Take 20 mg by mouth at bedtime.  07/25/18  Yes [provider]  Prenat-FeCbn-FeBisg-FA-Omega (MULTIVITAMIN/MINERALS PO) Take 1 tablet by mouth daily.   Yes [provider]  rOPINIRole (REQUIP) 0.25 MG tablet Take 1 tablet (0.25 mg total) by mouth at bedtime. 06/08/22  Yes Lurene Shadow, MD   sertraline (ZOLOFT) 25 MG tablet Take 25 mg by mouth daily.   Yes [provider]  sertraline (ZOLOFT) 50 MG tablet Take 50 mg by mouth daily.  Yes [provider]  talc powder Apply 1 application topically as needed (under the breast irritation).   Yes [provider]  traZODone (DESYREL) 50 MG tablet Take 50 mg by mouth at bedtime as needed for sleep. 06/07/18  Yes [provider]  traZODone (DESYREL) 50 MG tablet Take 25 mg by mouth 2 (two) times daily as needed for sleep. Do not give within 2 hours of night dose   Yes [provider]  traZODone (DESYREL) 50 MG tablet Take 50 mg by mouth at bedtime.   Yes [provider]  vitamin B-12 (CYANOCOBALAMIN) 1000 MCG tablet Take 1,000 mcg by mouth daily.   Yes [provider]  alum & mag hydroxide-simeth (MAALOX/MYLANTA) 200-200-20 MG/5ML suspension Take 10 mLs by mouth 3 (three) times daily as needed for indigestion or heartburn.    [provider]  senna (SENOKOT) 8.6 MG TABS tablet Take 1 tablet by mouth every Monday, Wednesday, and Friday.    [provider]  Simethicone LIQD 10 mLs by Does not apply route 3 (three) times daily as needed. 200-200-20 mg/5 mL Patient not taking: Reported on 01/20/2023    [provider]     Critical care provider statement:   Total critical care time: 33 minutes   Performed by: Karna Christmas MD   Critical care time was exclusive of separately billable procedures and treating other patients.   Critical care was necessary to treat or prevent imminent or life-threatening deterioration.   Critical care was time spent personally by me on the following activities: development of treatment plan with patient and/or surrogate as well as nursing, discussions with consultants, evaluation of patient's response to treatment, examination of patient, obtaining history from patient or surrogate, ordering and performing treatments and  interventions, ordering and review of laboratory studies, ordering and review of radiographic studies, pulse oximetry and re-evaluation of patient's condition.    Vida Rigger, M.D.  Pulmonary & Critical Care Medicine

## 2023-01-25 NOTE — Progress Notes (Signed)
[facility]  - [place] [facility]   PATIENT NAME: Deborah Obrien    MR#:  [id number]  DATE OF BIRTH:  1940/09/29  SUBJECTIVE:   Patient transferred to [facility] service from ICU. Apparently came in with hip fracture after fall. Underwent hypothalamic hypotensive shock requiring IV vasopressin. Now hemodynamically stable. Patient sitting up in the chair. Seen by PT earlier. She was a bit overwhelmed and tearful when I discussed about discharge planning and rehab. No family at bedside.   VITALS:  Blood pressure 99/60, pulse 60, temperature 97.7 F (36.5 C), temperature source Oral, resp. rate 15, height 5\' 3"  (1.6 m), weight 78.5 kg, SpO2 97 %.  PHYSICAL EXAMINATION:   GENERAL:  82 y.o.-year-old patient with no acute distress.  LUNGS: Normal breath sounds bilaterally, no wheezing CARDIOVASCULAR: S1, S2 normal. No murmur   ABDOMEN: Soft, nontender, nondistended. Bowel sounds present.  EXTREMITIES: No  edema b/l.    NEUROLOGIC: nonfocal  patient is alert and awake. weak SKIN: No obvious rash, lesion, or ulcer.   LABORATORY PANEL:  CBC Recent Labs  Lab 01/24/23 0356 01/24/23 1219 01/25/23 0404  WBC 5.7  --   --   HGB 8.4*   < > 8.9*  HCT 26.3*   < > 28.3*  PLT 119*  --   --    < > = values in this interval not displayed.    Chemistries  Recent Labs  Lab 01/25/23 0404  NA 138  K 4.0  CL 108  CO2 24  GLUCOSE 238*  BUN 45*  CREATININE 1.39*  CALCIUM 8.5*  MG 1.9  AST 39  ALT 12  ALKPHOS 144*  BILITOT 1.1   Assessment and Plan  82 y.o. 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 Patient came to the emergency room after she had a fall at [facility]. She underwent intramedullary  nailing of right femur with cephalomedullary device by Dr Allena Katz on 01/21/2023 patient was transferred to ICU for hypovolemia shock. She required IV vasopressors-- now weaned off.  Shock-- hypovolemic/hypotensive --was placed on IV vasopressors--weaned off --cont Midodrine --D/c stress dose steroids  Acute hypercapnia/hypoxic respiratory failure -- PRN BiPAP -- resolved -- patient on room air  Acute kidney injury superimposed on CKD stage IV -- Lasix on hold -- improved -- seen by Dr. Wynelle Link -- replace electrolytes as indicated  Right intertrochanteric fracture status post mechanical fall status post intramedullary nailing -- patient followed by orthopedic Dr. Signa Kell -- continue PT OT. TOC for discharge planning to rehab  Anemia without signs of active bleeding -- hemoglobin stable  paroxysmal atrial fibrillation -- appreciate [facility] cardiology input-- [facility] MG signed off. Follow-up outpatient 3 to 4 weeks after discharge with Dr. Graciela Husbands -- continue amiodarone -- continue apixaban  COVID-19 infection -- asymptomatic -- supportive care  Procedures right hip surgery Family communication : none today Consults : orthopedic, nephrology, cardiology CODE STATUS: full DVT Prophylaxis : eliquis Level of care: Telemetry Medical Status is: Inpatient Remains inpatient appropriate because: PT OT and discharge planning transfer out of ICU    TOTAL TIME TAKING CARE OF THIS PATIENT: 35 minutes.  >50% time spent on counselling and coordination of care  Note: This dictation was prepared with Dragon dictation along with smaller phrase technology. Any transcriptional errors that result from  this process are unintentional.  Enedina Finner M.D    [facility]   CC: Primary care physician; Melonie Florida, FNP

## 2023-01-25 NOTE — Progress Notes (Signed)
Subjective: 4 Days Post-Op Procedure(s) (LRB): INTRAMEDULLARY (IM) NAIL INTERTROCHANTERIC (Right) Patient reports pain as mild to moderate.  Feels better this AM Patient is in ICU for Covid Positive with CKD.  Breathing normally.  No cough.  Pressor step down.   PT order is placed, was seen earlier today by physical therapy who put off working with the patient given the fact that she is on pressors to help with her blood pressure.   Objective: Vital signs in last 24 hours: Temp:  [97.6 F (36.4 C)-98.2 F (36.8 C)] 98.2 F (36.8 C) (05/15 0000) Pulse Rate:  [59-128] 60 (05/15 0600) Resp:  [9-27] 14 (05/15 0600) BP: (95-140)/(40-78) 105/52 (05/15 0600) SpO2:  [89 %-100 %] 97 % (05/15 0600)  Intake/Output from previous day:  Intake/Output Summary (Last 24 hours) at 01/25/2023 0659 Last data filed at 01/25/2023 0600 Gross per 24 hour  Intake 1292.08 ml  Output 970 ml  Net 322.08 ml    Intake/Output this shift: Total I/O In: 210 [P.O.:210] Out: 485 [Urine:485]  Labs: Recent Labs    01/24/23 0356 01/24/23 1219 01/24/23 1749 01/25/23 0018 01/25/23 0404  HGB 8.4* 9.0* 9.6* 9.3* 8.9*   Recent Labs    01/23/23 0410 01/23/23 0630 01/24/23 0356 01/24/23 1219 01/25/23 0018 01/25/23 0404  WBC 10.0  --  5.7  --   --   --   RBC 2.38*  --  2.66*  --   --   --   HCT 23.4*   < > 26.3*   < > 28.4* 28.3*  PLT 102*  --  119*  --   --   --    < > = values in this interval not displayed.   Recent Labs    01/24/23 0356 01/25/23 0404  NA 139 138  K 4.0 4.0  CL 109 108  CO2 24 24  BUN 43* 45*  CREATININE 1.54* 1.39*  GLUCOSE 181* 238*  CALCIUM 8.3* 8.5*   Recent Labs    01/23/23 0936  INR 1.3*    EXAM General - Patient is Alert, Appropriate, and Oriented Extremity - Neurologically intact ABD soft Neurovascular intact Sensation intact distally Intact pulses distally Dorsiflexion/Plantar flexion intact No cellulitis present Compartment soft There is not appear  to be any hematoma formation along the hip-there is minimal to no ecchymosis noted on physical exam Noted pain to palpation over the right hip, incision site and mid femur. Dressing -  dressing is intact, there is a mild amount of blood on the honeycomb, does not need to be changed at this moment Motor Function - intact, moving foot and toes well on exam.  Able to plantar and dorsiflex with good range of motion and strength.  Patient has difficulty with straight leg raise due to the pain.  Patient is neurovascularly intact -sensation across all right lower leg dermatomes is intact and pedal pulses appreciated.  Past Medical History:  Diagnosis Date   Acid reflux    Acute respiratory failure (HCC) 04/09/2018   Anemia    Anxiety    Bowel obstruction (HCC)    Carotid artery disease (HCC)    Chronic heart failure with preserved ejection fraction (HFpEF) (HCC)    a. 03/2018 Echo: EF 60-65%, mildly dil LA/RA, mod dil RV, mod TR, PASP ; b. 01/2023 Echo: EF 60-65%, no rwma, low-nl RV fxn, RVSP 51.85mmHg. Mild MR. Mod-sev TR.   CKD (chronic kidney disease), stage IV (HCC)    stage IV   Closed fracture of  one rib of right side 05/29/2020   Complete tear of left rotator cuff 10/16/2014   Coronary artery disease (non-obstructive)    a. 08/2016 Cath: LM 25, LAD 20p, LCX 15p, RCA nl, EF 55%, 2+ MR; 10/2017 MV: No isch/infarct, EF 55-65%.   Depression    Diabetes mellitus without complication (HCC)    Diabetic neuropathy (HCC)    Diarrhea 03/04/2022   Fatty liver    Gastritis without bleeding    GI bleed    Gout    Hypertension    Hypothyroidism    Low back pain    history of kyphoplasty t12-l1   Morbid obesity (HCC)    Multiple gastric polyps    history of gi bleed.  polyps removed   Osteoarthritis    Osteoporosis    PAF (paroxysmal atrial fibrillation) (HCC) 2016   a. On amio/eliquis (CHA2DS2VASc = 9).   Paroxysmal atrial fibrillation (HCC)    Presence of permanent cardiac pacemaker     Pulmonary hypertension (HCC)    a. 03/2018 Echo: PASP .   Sinus node dysfunction (HCC) 08/24/2017   Sinus node dysfunction (HCC)    a. MDT- upgraded to dual chamber device Dec 2018- lead revision post upgrade dec 2018 (MDT [id number] Azure XT DR MRI).   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   Uncontrolled type 2 diabetes mellitus with hypoglycemia, with long-term current use of insulin (HCC) 03/04/2022    Assessment/Plan: 4 Days Post-Op Procedure(s) (LRB): INTRAMEDULLARY (IM) NAIL INTERTROCHANTERIC (Right) Principal Problem:   Shock (HCC) Active Problems:   Permanent atrial fibrillation (HCC)   Gastro-esophageal reflux disease without esophagitis   Gout   Hyperlipemia, mixed   CAD (coronary artery disease)   UTI (urinary tract infection)   Hypothyroidism   Acute renal failure superimposed on stage 4 chronic kidney disease (HCC)   Chronic heart failure with preserved ejection fraction (HCC)   Transaminitis   Hypotension after procedure   Closed fracture of right hip (HCC)   Nausea   Pulmonary hypertension (HCC)   Metabolic acidosis   Fall   Paroxysmal atrial fibrillation (HCC) COVID-positive  Estimated body mass index is 30.66 kg/m as calculated from the following:   Height as of this encounter: 5\' 3"  (1.6 m).   Weight as of this encounter: 78.5 kg. Up with therapy -once the patient is more stable, urge to get the patient moving with physical therapy, WBAT.  Transferred to bed.    Patient's incision site looks good, with minimal sanguinous drainage.  No signs of infection. Patient is neurovascularly intact on her right leg   DVT Prophylaxis -  may resume baseline Eliquis.  However is being currently worked up by cardiology, pharmacy and inpatient medicine for heparin infusion Weight Bearing As Tolerated right leg Can work with therapy  once stable enough to do so  Acute blood loss s/p Hip Fracture.  Hgb 8.9 after PRBC transfusion.    Will continue to work with inpatient medicine, cardiology and pharmacy to help manage this patient's other comorbidities.   Dedra Skeens, PA-C [facility] 01/25/2023, 6:59 AM

## 2023-01-25 NOTE — TOC Initial Note (Signed)
Transition of Care [facility]) - Initial/Assessment Note    Patient Details  Name: Deborah Obrien MRN: [id number] Date of Birth: 03/02/1941  Transition of [facility] CM/SW Contact:    Darolyn Rua, LCSW Phone Number: 01/25/2023, 3:58 PM  Clinical Narrative:                  CSW notes patient is from [facility], worked with PT today and recommending SNF.   Per MD patient distraught over this as she wants to go back home to [facility], husband is in [facility].   CSW spoke with patient's son Caryn Bee who reports due to patient being distraught he will be helping her with the discharge plan. He reports he has spoke with [facility] they confirm they cannot accept patient back until she goes to STR.   Agreeable for referrals to be sent out locally for SNF bed offers.   Referrals have been sent pending bed offers at this time.   Expected Discharge Plan: Skilled Nursing Facility Barriers to Discharge: Continued Medical Work up   Patient Goals and CMS Choice Patient states their goals for this hospitalization and ongoing recovery are:: to go back to [employer].gov Compare Post Acute Care list provided to:: Patient Represenative (must comment) (son kevin) Choice offered to / list presented to : Adult Children      Expected Discharge Plan and Services       Living arrangements for the past 2 months: Assisted Living Facility ([facility])                                      Prior Living Arrangements/Services Living arrangements for the past 2 months: Assisted Living Facility [facility]) Lives with:: Facility Resident                   Activities of Daily Living Home Assistive Devices/Equipment: Environmental consultant (specify type) ADL Screening (condition at time of admission) Patient's cognitive ability adequate to safely complete daily activities?: Yes Is the patient deaf or have difficulty hearing?: No Does the patient  have difficulty seeing, even when wearing glasses/contacts?: Yes Does the patient have difficulty concentrating, remembering, or making decisions?: No Patient able to express need for assistance with ADLs?: Yes Does the patient have difficulty dressing or bathing?: No Independently performs ADLs?: No Does the patient have difficulty walking or climbing stairs?: Yes Weakness of Legs: Both Weakness of Arms/Hands: None  Permission Sought/Granted                  Emotional Assessment              Admission diagnosis:  Hip fracture (HCC) [S72.009A] Fall, initial encounter [W19.XXXA] Closed fracture of right hip, initial encounter (HCC) [S72.001A] Patient Active Problem List   Diagnosis Date Noted   Paroxysmal atrial fibrillation (HCC) 01/24/2023   Pulmonary hypertension (HCC) 01/22/2023   Metabolic acidosis 01/22/2023   Fall 01/22/2023   Transaminitis 01/21/2023   Hypotension after procedure 01/21/2023   Shock (HCC) 01/21/2023   Closed fracture of right hip (HCC) 01/21/2023   Nausea 01/21/2023   Chronic heart failure with preserved ejection fraction (HCC) 01/01/2023   Restless leg syndrome 05/31/2022   Hypothyroidism 05/30/2022   Acute renal failure superimposed on stage 4 chronic kidney disease (HCC) 05/30/2022   Drop in hemoglobin 05/30/2022   Elevated liver function tests  05/30/2022   History of gout 05/30/2022   Humerus fracture 05/29/2022   Abnormal urinalysis 03/05/2022   Hypoglycemia 03/04/2022   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 [facility]) 03/02/2022   Osteoporosis 02/13/2020   Primary osteoarthritis of left shoulder 11/10/2019   Uses walker 09/27/2018   UTI (urinary tract infection) 06/14/2018   Chronic anticoagulation 04/09/2018   Cardiac pacemaker in situ 04/09/2018   H/O TIA (transient ischemic attack) and stroke 04/09/2018   CAD (coronary artery disease) 04/09/2018   Sinus node dysfunction (HCC) 08/24/2017   Dizziness  06/12/2017   Nephrolithiasis 02/22/2016   Diabetic neuropathy with neurologic complication (HCC) 11/30/2015   Recurrent major depressive disorder, in remission (HCC) 08/11/2015   Polyp of stomach and duodenum 07/29/2015   Gastric polyp    Gastro-esophageal reflux disease without esophagitis    Permanent atrial fibrillation (HCC) 06/29/2015   Intestinal obstruction (HCC) 02/20/2015   DDD (degenerative disc disease), lumbar 01/19/2015   Back pain 01/12/2015   Gout 01/12/2015   Intervertebral disc disorder with radiculopathy of lumbar region 09/17/2014   Lumbar radiculitis 09/17/2014   Lumbar stenosis with neurogenic claudication 09/17/2014   Neuritis or radiculitis due to rupture of lumbar intervertebral disc 09/17/2014   Atherosclerosis of both carotid arteries 07/15/2014   Hyperlipemia, mixed 07/15/2014   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   Bilateral carotid artery disease (HCC) 07/15/2014   Body mass index (BMI) of 40.0-44.9 in adult [facility]) 06/21/2014   Mixed urge and stress incontinence 06/25/2013   Nocturia 06/25/2013   Blurring of visual image 04/11/2012   Other visual disturbances 04/11/2012   PCP:  Melonie Florida, FNP Pharmacy:   [facility] - [place], [place] - [address] [place] [address] Phone: [phone] Fax: [phone]     Social Determinants of Health (SDOH) Social History: SDOH Screenings   Food Insecurity: No Food Insecurity (01/20/2023)  Housing: Low Risk  (01/20/2023)  Transportation Needs: No Transportation Needs (01/20/2023)  Utilities: Not At Risk (01/20/2023)  Tobacco Use: Low Risk  (01/23/2023)   SDOH Interventions:     Readmission Risk Interventions     No data to display

## 2023-01-25 NOTE — Evaluation (Signed)
Occupational Therapy Evaluation Patient Details Name: Deborah Obrien MRN: [id number] DOB: [birth year] Today's Date: 01/25/2023   History of Present Illness Pt admitted for shock secondary to R hip fracture and is s/p R hip IM nailing. Currently POD 4 at day of evaluation. Cleared to work with therapy per medical team. History includes pacemaker, Afib, HTN, HLD, CAD and stage 4 kidney disease.   Clinical Impression   Patient received for OT evaluation. See flowsheet below for details of function. Generally, patient requiring MIN A for bed mobility, MIN A with RW for functional mobility, and MIN-MAX A for ADLs. Was found to be Covid+. Did get dizzy when up (orthostatics below), but no overt loss of balance. Patient will benefit from continued OT while in acute care.       Recommendations for follow up therapy are one component of a multi-disciplinary discharge planning process, led by the attending physician.  Recommendations may be updated based on patient status, additional functional criteria and insurance authorization.   Assistance Recommended at Discharge Frequent or constant Supervision/Assistance  Patient can return home with the following A little help with walking and/or transfers;A little help with bathing/dressing/bathroom;Assistance with cooking/housework;Direct supervision/assist for medications management;Direct supervision/assist for financial management;Assist for transportation    Functional Status Assessment  Patient has had a recent decline in their functional status and demonstrates the ability to make significant improvements in function in a reasonable and predictable amount of time.  Equipment Recommendations  Other (comment) (defer to next venue of care)    Recommendations for Other Services       Precautions / Restrictions Precautions Precautions: Fall Precaution Comments: airborne/contact precautions 2/2 Covid+ Restrictions Weight Bearing Restrictions:  Yes RLE Weight Bearing: Weight bearing as tolerated      Mobility Bed Mobility Overal bed mobility: Needs Assistance Bed Mobility: Supine to Sit     Supine to sit: Min assist          Transfers Overall transfer level: Needs assistance Equipment used: Rolling walker (2 wheels) Transfers: Sit to/from Stand Sit to Stand: Min assist                  Balance                                           ADL either performed or assessed with clinical judgement   ADL Overall ADL's : Needs assistance/impaired Eating/Feeding: Set up;Sitting Eating/Feeding Details (indicate cue type and reason): seated in recliner at end of session; needed help getting stuck container top off.                 Lower Body Dressing: Maximal assistance Lower Body Dressing Details (indicate cue type and reason): assist for donning socks             Functional mobility during ADLs: Min guard;Rolling walker (2 wheels) (gait belt for safety) General ADL Comments: Pt with decreased flexion at hips 2/2 pain. Anticipate set up UB dressing/grooming/UB bathing from seated; reliant on RW during standing, so likely overall MOD A for LB Dressing/bathing at this time. Anticipate CGA t/f to toilet (high with rails).     Vision Baseline Vision/History: 1 Wears glasses Patient Visual Report: No change from baseline       Perception     Praxis      Pertinent Vitals/Pain Pain Assessment Pain Assessment: 0-10 Pain Score: 4  Pain Location: R hip while seated 4/10; 5/10 when walking. Pain Descriptors / Indicators: Aching Pain Intervention(s): Limited activity within patient's tolerance, Monitored during session     Hand Dominance Right   Extremity/Trunk Assessment Upper Extremity Assessment Upper Extremity Assessment: Overall WFL for tasks assessed   Lower Extremity Assessment Lower Extremity Assessment: Generalized weakness (R LE grossly 3+/5; L LE grossly 4/5)    Cervical / Trunk Assessment Cervical / Trunk Assessment: Normal   Communication Communication Communication: No difficulties   Cognition Arousal/Alertness: Awake/alert Behavior During Therapy: WFL for tasks assessed/performed Overall Cognitive Status: Within Functional Limits for tasks assessed                                 General Comments: Pleasant and motivated.     General Comments  BP at EOB 109/57. Walked approx 8 feet, then felt dizzy; stood for a moment, then returned to seated position in recliner. BP in recliner with BIL LE elevated 3 minutes after mobility 133/66. Pt feeling better once in chair. On room air. Able to walk in room approx 20 feet total.    Exercises     Shoulder Instructions      Home Living Family/patient expects to be discharged to:: Assisted living                             Home Equipment: Rollator (4 wheels);Cane - quad;Wheelchair - manual;Grab bars - tub/shower;Grab bars - toilet;Shower seat - built in   Additional Comments: [employer]. Husband has dementia; lives at a different facility.      Prior Functioning/Environment Prior Level of Function : Independent/Modified Independent;History of Falls (last six months)             Mobility Comments: Ambulates with rollator (uses w/c for longer distances); h/o falls; last fall prior to recent one is Sept 2023 with broken L arm. ADLs Comments: (I) ADLs; goes to dining room for dinner. Enjoys [employer] account planner, Bible study, exercise group, and other social activities at the ALF. Does not drive at baseline.        OT Problem List:        OT Treatment/Interventions: Self-care/ADL training;Therapeutic exercise;Therapeutic activities;DME and/or AE instruction    OT Goals(Current goals can be found in the care plan section) Acute Rehab OT Goals Patient Stated Goal: Go to rehab to get stronger before returning home OT Goal Formulation: With patient Time For Goal  Achievement: 02/08/23 Potential to Achieve Goals: Good ADL Goals Pt Will Perform Grooming: with modified independence;standing Pt Will Perform Lower Body Dressing: with modified independence;sit to/from stand Pt Will Transfer to Toilet: with modified independence;bedside commode;ambulating Pt Will Perform Toileting - Clothing Manipulation and hygiene: with modified independence;sit to/from stand  OT Frequency: Min 1X/week    Co-evaluation PT/OT/SLP Co-Evaluation/Treatment: Yes Reason for Co-Treatment: For patient/therapist safety PT goals addressed during session: Mobility/safety with mobility OT goals addressed during session: ADL's and self-care      AM-PAC OT "6 Clicks" Daily Activity     Outcome Measure Help from another person eating meals?: None Help from another person taking care of personal grooming?: A Little Help from another person toileting, which includes using toliet, bedpan, or urinal?: A Little Help from another person bathing (including washing, rinsing, drying)?: A Lot Help from another person to put on and taking off regular upper body clothing?: A Little Help from another  person to put on and taking off regular lower body clothing?: A Lot 6 Click Score: 17   End of Session Equipment Utilized During Treatment: Gait belt;Rolling walker (2 wheels) Nurse Communication: Mobility status  Activity Tolerance: Patient tolerated treatment well Patient left: in chair;with call bell/phone within reach;Other (comment) (RN aware of pt status)  OT Visit Diagnosis: History of falling (Z91.81)                Time: 1610-9604 OT Time Calculation (min): 31 min Charges:  OT General Charges $OT Visit: 1 Visit OT Evaluation $OT Eval Moderate Complexity: 1 Mod  Edward Guthmiller Junie Panning, MS, OTR/L  Alvester Morin 01/25/2023, 11:28 AM

## 2023-01-26 DIAGNOSIS — I4821 Permanent atrial fibrillation: Secondary | ICD-10-CM | POA: Diagnosis not present

## 2023-01-26 DIAGNOSIS — S72001A Fracture of unspecified part of neck of right femur, initial encounter for closed fracture: Secondary | ICD-10-CM | POA: Diagnosis not present

## 2023-01-26 DIAGNOSIS — I25118 Atherosclerotic heart disease of native coronary artery with other forms of angina pectoris: Secondary | ICD-10-CM | POA: Diagnosis not present

## 2023-01-26 DIAGNOSIS — K219 Gastro-esophageal reflux disease without esophagitis: Secondary | ICD-10-CM | POA: Diagnosis not present

## 2023-01-26 LAB — GLUCOSE, CAPILLARY
Glucose-Capillary: 108 mg/dL — ABNORMAL HIGH (ref 70–99)
Glucose-Capillary: 141 mg/dL — ABNORMAL HIGH (ref 70–99)
Glucose-Capillary: 161 mg/dL — ABNORMAL HIGH (ref 70–99)
Glucose-Capillary: 207 mg/dL — ABNORMAL HIGH (ref 70–99)
Glucose-Capillary: 78 mg/dL (ref 70–99)
Glucose-Capillary: 94 mg/dL (ref 70–99)
Glucose-Capillary: 95 mg/dL (ref 70–99)

## 2023-01-26 LAB — CULTURE, BLOOD (ROUTINE X 2)

## 2023-01-26 MED ORDER — INSULIN GLARGINE-YFGN 100 UNIT/ML ~~LOC~~ SOLN
9.0000 [IU] | Freq: Every day | SUBCUTANEOUS | Status: DC
  Administered 2023-01-26 – 2023-01-31 (×6): 9 [IU] via SUBCUTANEOUS
  Filled 2023-01-26 (×6): qty 0.09

## 2023-01-26 MED ORDER — TRAMADOL HCL 50 MG PO TABS: 50.0000 mg | ORAL_TABLET | Freq: Four times a day (QID) | ORAL | 0 refills | Status: DC | PRN

## 2023-01-26 MED ORDER — FUROSEMIDE 20 MG PO TABS
20.0000 mg | ORAL_TABLET | Freq: Every day | ORAL | Status: DC
  Administered 2023-01-27 – 2023-02-01 (×6): 20 mg via ORAL
  Filled 2023-01-26 (×6): qty 1

## 2023-01-26 MED ORDER — POLYETHYLENE GLYCOL 3350 17 G PO PACK
17.0000 g | PACK | Freq: Two times a day (BID) | ORAL | Status: DC
  Administered 2023-01-26 – 2023-02-01 (×10): 17 g via ORAL
  Filled 2023-01-26 (×12): qty 1

## 2023-01-26 NOTE — Progress Notes (Signed)
Subjective: 5 Days Post-Op Procedure(s) (LRB): INTRAMEDULLARY (IM) NAIL INTERTROCHANTERIC (Right) Patient reports pain as mild to moderate.  Feels better this AM Patient is now on the orthopedic floor and is in contact precaution for Covid Positive with CKD.  Breathing normally.  No cough.  Pressor step down.   PT order is placed, was seen earlier today by physical therapy who put off working with the patient given the fact that she is on pressors to help with her blood pressure.   Objective: Vital signs in last 24 hours: Temp:  [97.7 F (36.5 C)-97.8 F (36.6 C)] 97.7 F (36.5 C) (05/16 0428) Pulse Rate:  [58-71] 61 (05/16 0428) Resp:  [11-20] 20 (05/16 0428) BP: (99-128)/(39-60) 120/50 (05/16 0428) SpO2:  [93 %-100 %] 99 % (05/16 0428) Weight:  [78 kg] 78 kg (05/16 0500)  Intake/Output from previous day:  Intake/Output Summary (Last 24 hours) at 01/26/2023 0654 Last data filed at 01/26/2023 0430 Gross per 24 hour  Intake 870 ml  Output 525 ml  Net 345 ml    Intake/Output this shift: Total I/O In: -  Out: 425 [Urine:425]  Labs: Recent Labs    01/24/23 0356 01/24/23 1219 01/24/23 1749 01/25/23 0018 01/25/23 0404  HGB 8.4* 9.0* 9.6* 9.3* 8.9*   Recent Labs    01/24/23 0356 01/24/23 1219 01/25/23 0018 01/25/23 0404  WBC 5.7  --   --   --   RBC 2.66*  --   --   --   HCT 26.3*   < > 28.4* 28.3*  PLT 119*  --   --   --    < > = values in this interval not displayed.   Recent Labs    01/24/23 0356 01/25/23 0404  NA 139 138  K 4.0 4.0  CL 109 108  CO2 24 24  BUN 43* 45*  CREATININE 1.54* 1.39*  GLUCOSE 181* 238*  CALCIUM 8.3* 8.5*   Recent Labs    01/23/23 0936  INR 1.3*    EXAM General - Patient is Alert, Appropriate, and Oriented Extremity - Neurologically intact ABD soft Neurovascular intact Sensation intact distally Intact pulses distally Dorsiflexion/Plantar flexion intact No cellulitis present Compartment soft There is not appear to be  any hematoma formation along the hip-there is minimal to no ecchymosis noted on physical exam Noted pain to palpation over the right hip, incision site and mid femur. Dressing -  dressing is intact, there is a mild amount of blood on the honeycomb, does not need to be changed at this moment Motor Function - intact, moving foot and toes well on exam.  Ambulated 15 feet with physical therapy  Past Medical History:  Diagnosis Date   Acid reflux    Acute respiratory failure (HCC) 04/09/2018   Anemia    Anxiety    Bowel obstruction (HCC)    Carotid artery disease (HCC)    Chronic heart failure with preserved ejection fraction (HFpEF) (HCC)    a. 03/2018 Echo: EF 60-65%, mildly dil LA/RA, mod dil RV, mod TR, PASP ; b. 01/2023 Echo: EF 60-65%, no rwma, low-nl RV fxn, RVSP 51.70mmHg. Mild MR. Mod-sev TR.   CKD (chronic kidney disease), stage IV (HCC)    stage IV   Closed fracture of one rib of right side 05/29/2020   Complete tear of left rotator cuff 10/16/2014   Coronary artery disease (non-obstructive)    a. 08/2016 Cath: LM 25, LAD 20p, LCX 15p, RCA nl, EF 55%, 2+ MR; 10/2017 MV:  No isch/infarct, EF 55-65%.   Depression    Diabetes mellitus without complication (HCC)    Diabetic neuropathy (HCC)    Diarrhea 03/04/2022   Fatty liver    Gastritis without bleeding    GI bleed    Gout    Hypertension    Hypothyroidism    Low back pain    history of kyphoplasty t12-l1   Morbid obesity (HCC)    Multiple gastric polyps    history of gi bleed.  polyps removed   Osteoarthritis    Osteoporosis    PAF (paroxysmal atrial fibrillation) (HCC) 2016   a. On amio/eliquis (CHA2DS2VASc = 9).   Paroxysmal atrial fibrillation (HCC)    Presence of permanent cardiac pacemaker    Pulmonary hypertension (HCC)    a. 03/2018 Echo: PASP .   Sinus node dysfunction (HCC) 08/24/2017   Sinus node dysfunction (HCC)    a. MDT- upgraded to dual chamber device Dec 2018- lead revision post upgrade dec  2018 (MDT [id number] Azure XT DR MRI).   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   Uncontrolled type 2 diabetes mellitus with hypoglycemia, with long-term current use of insulin (HCC) 03/04/2022    Assessment/Plan: 5 Days Post-Op Procedure(s) (LRB): INTRAMEDULLARY (IM) NAIL INTERTROCHANTERIC (Right) Principal Problem:   Shock (HCC) Active Problems:   Permanent atrial fibrillation (HCC)   Gastro-esophageal reflux disease without esophagitis   Gout   Hyperlipemia, mixed   CAD (coronary artery disease)   UTI (urinary tract infection)   Hypothyroidism   Acute renal failure superimposed on stage 4 chronic kidney disease (HCC)   Chronic heart failure with preserved ejection fraction (HCC)   Transaminitis   Hypotension after procedure   Closed fracture of right hip (HCC)   Nausea   Pulmonary hypertension (HCC)   Metabolic acidosis   Fall   Paroxysmal atrial fibrillation (HCC) COVID-positive  Estimated body mass index is 30.46 kg/m as calculated from the following:   Height as of this encounter: 5\' 3"  (1.6 m).   Weight as of this encounter: 78 kg. Up with therapy -once the patient is more stable, urge to get the patient moving with physical therapy, WBAT.  Patient's incision site looks good, with minimal sanguinous drainage.  No signs of infection. Patient is neurovascularly intact on her right leg  DVT Prophylaxis -  may resume baseline Eliquis.  However is being currently worked up by cardiology, pharmacy and inpatient medicine for heparin infusion  Acute blood loss s/p Hip Fracture.  Hgb 8.9 after PRBC transfusion.  Stable  Will continue to work with inpatient medicine, cardiology and pharmacy to help manage this patient's other comorbidities.   Dedra Skeens, PA-C [facility] 01/26/2023, 6:54 AM

## 2023-01-26 NOTE — Inpatient Diabetes Management (Signed)
Inpatient Diabetes Program Recommendations  AACE/ADA: New Consensus Statement on Inpatient Glycemic Control   Target Ranges:  Prepandial:   less than 140 mg/dL      Peak postprandial:   less than 180 mg/dL (1-2 hours)      Critically ill patients:  140 - 180 mg/dL    Latest Reference Range & Units 01/26/23 00:09 01/26/23 04:24 01/26/23 08:16  Glucose-Capillary 70 - 99 mg/dL 95 78 94    Latest Reference Range & Units 01/25/23 04:02 01/25/23 08:24 01/25/23 11:36 01/25/23 17:19 01/25/23 20:05  Glucose-Capillary 70 - 99 mg/dL 161 (H) 096 (H) 045 (H) 286 (H) 242 (H)   Review of Glycemic Control  Diabetes history: DM2 Outpatient Diabetes medications: Lantus 16 units QHS, Humalog 0-12 units TID with meals Current orders for Inpatient glycemic control: Semglee 12 units QHS, Novolog 0-20 units Q4H  Inpatient Diabetes Program Recommendations:    Insulin: CBGs over past 8 hours ranged from 78-95 mg/dl and Solucortef no longer ordered. Please consider decreasing Semglee to 9 units QHS.  Thanks, Orlando Penner, RN, MSN, CDCES Diabetes Coordinator Inpatient Diabetes Program [phone] (Team Pager from 8am to 5pm)

## 2023-01-26 NOTE — Progress Notes (Addendum)
[facility]  - [place] at [facility]   PATIENT NAME: Deborah Obrien    MR#:  [id number]  DATE OF BIRTH:  [birth year]  SUBJECTIVE:   Patient transferred to [facility] service from ICU. Apparently came in with hip fracture after fall. Underwent hypothalamic hypotensive shock requiring IV vasopressin. Now hemodynamically stable. Patient sitting up in the chair. Seen by PT earlier. She was a bit overwhelmed and tearful when I discussed about discharge planning and rehab. No family at bedside.   VITALS:  Blood pressure (!) 129/58, pulse 62, temperature 98.1 F (36.7 C), resp. rate 18, height 5\' 3"  (1.6 m), weight 78 kg, SpO2 99 %.  PHYSICAL EXAMINATION:   GENERAL:  82 y.o.-year-old patient with no acute distress.  LUNGS: Normal breath sounds bilaterally, no wheezing CARDIOVASCULAR: S1, S2 normal. No murmur   ABDOMEN: Soft, nontender, nondistended. Bowel sounds present.  EXTREMITIES: No  edema b/l.    NEUROLOGIC: nonfocal  patient is alert and awake. weak  LABORATORY PANEL:  CBC Recent Labs  Lab 01/24/23 0356 01/24/23 1219 01/25/23 0404  WBC 5.7  --   --   HGB 8.4*   < > 8.9*  HCT 26.3*   < > 28.3*  PLT 119*  --   --    < > = values in this interval not displayed.     Chemistries  Recent Labs  Lab 01/25/23 0404  NA 138  K 4.0  CL 108  CO2 24  GLUCOSE 238*  BUN 45*  CREATININE 1.39*  CALCIUM 8.5*  MG 1.9  AST 39  ALT 12  ALKPHOS 144*  BILITOT 1.1    Assessment and Plan  82 y.o. 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 Patient came to the emergency room after she had a fall at [facility] [place] [facility]. She underwent intramedullary nailing of right femur with cephalomedullary device by Dr Allena Katz  on 01/21/2023 patient was transferred to ICU for hypovolemia shock. She required IV vasopressors-- now weaned off.  Shock-- hypovolemic/hypotensive --was placed on IV vasopressors--weaned off --cont Midodrine --D/c stress dose steroids  Acute hypercapnia/hypoxic respiratory failure -- PRN BiPAP -- resolved -- patient on room air  Acute kidney injury superimposed on CKD stage IV -- Lasix on hold -- improved -- seen by Dr. Wynelle Link -- replace electrolytes as indicated -- will resume Lasix 20 mg daily since creatinine trending down. Discussed with Dr. Wynelle Link  Right intertrochanteric fracture status post mechanical fall status post intramedullary nailing -- patient followed by orthopedic Dr. Signa Kell -- continue PT OT. TOC for discharge planning to rehab  Anemia without signs of active bleeding -- hemoglobin stable  paroxysmal atrial fibrillation -- appreciate [facility] cardiology input-- [facility] signed off. Follow-up outpatient 3 to 4 weeks after discharge with Dr. Graciela Husbands -- continue amiodarone -- continue apixaban  COVID-19 infection -- asymptomatic -- supportive care  Procedures right hip surgery Family communication : son Caryn Bee on  the phone Consults : orthopedic, nephrology, cardiology CODE STATUS: full DVT Prophylaxis : eliquis Level of care: Telemetry Medical Status is: Inpatient Remains inpatient appropriate because: PT OT and discharge planning  Patient is medically stable for discharge. She has bed offer at [facility]. Per TOC given COVID she will have to stay for 10 days before discharge.    TOTAL TIME TAKING CARE  OF THIS PATIENT: 35 minutes.  >50% time spent on counselling and coordination of care  Note: This dictation was prepared with Dragon dictation along with smaller phrase technology. Any transcriptional errors that result from this process are unintentional.  Enedina Finner M.D    [facility]   CC: Primary care physician; Melonie Florida,  FNP

## 2023-01-26 NOTE — Progress Notes (Signed)
Physical Therapy Treatment Patient Details Name: Deborah Obrien MRN: [id number] DOB: 1940/12/31 Today's Date: 01/26/2023   History of Present Illness Pt admitted for shock secondary to R hip fracture and is s/p R hip IM nailing. Currently POD 4 at day of evaluation. Cleared to work with therapy per medical team. History includes pacemaker, Afib, HTN, HLD, CAD and stage 4 kidney disease.    PT Comments    Pt was long sitting in bed upon arrival. She is well known by author from prior admissions. Pt is A and O x 4 and agreeable to session. She overall tolerated PT session well and is progressing towards all acute PT goals. Pt was able to exit R side of bed, stand to RW, and tolerate ambulation to doorway of room and return. Distance limited by fatigue and pain. Once pt was repositioned in recliner, she demonstrate and tolerated ther ex well. Will need HEP handout next session as a reference. DC recs remain appropriate. Acute PT will continue to treat per current POC.   Recommendations for follow up therapy are one component of a multi-disciplinary discharge planning process, led by the attending physician.  Recommendations may be updated based on patient status, additional functional criteria and insurance authorization.     Assistance Recommended at Discharge Set up Supervision/Assistance  Patient can return home with the following A little help with walking and/or transfers;A little help with bathing/dressing/bathroom;Assistance with cooking/housework;Assist for transportation;Help with stairs or ramp for entrance   Equipment Recommendations  Other (comment) (Defer to next level of care)       Precautions / Restrictions Precautions Precautions: Fall Precaution Comments: airborne/contact precautions 2/2 Covid+ Restrictions Weight Bearing Restrictions: Yes RLE Weight Bearing: Weight bearing as tolerated     Mobility  Bed Mobility Overal bed mobility: Needs Assistance Bed Mobility:  Supine to Sit  Supine to sit: Min assist   Transfers Overall transfer level: Needs assistance Equipment used: Rolling walker (2 wheels) Transfers: Sit to/from Stand Sit to Stand: Min assist   Ambulation/Gait Ambulation/Gait assistance: Min guard Gait Distance (Feet): 30 Feet Assistive device: Rolling walker (2 wheels) Gait Pattern/deviations: Step-to pattern, Antalgic Gait velocity: decreased  General Gait Details: Pt was able to ambulate in room to doorway of room and return. distance limited by fatigue but overall pt tolerated well   Balance Overall balance assessment: Needs assistance, History of Falls Sitting-balance support: Feet supported, Bilateral upper extremity supported Sitting balance-Leahy Scale: Good     Standing balance support: Bilateral upper extremity supported, Reliant on assistive device for balance, During functional activity Standing balance-Leahy Scale: Fair       Cognition Arousal/Alertness: Awake/alert Behavior During Therapy: WFL for tasks assessed/performed Overall Cognitive Status: Within Functional Limits for tasks assessed      General Comments: Pleasant and motivated.        General Comments General comments (skin integrity, edema, etc.): Reviewed importance of performing exercises throughout the day to promote strengthening and improve saf functional mobility. pt states understanding and tolerated well. will need HEP handout next session      Pertinent Vitals/Pain Pain Assessment Pain Assessment: 0-10 Pain Score: 4  Pain Location: R hip while seated 4/10; 5/10 when walking. Pain Descriptors / Indicators: Aching Pain Intervention(s): Limited activity within patient's tolerance, Monitored during session, Premedicated before session, Repositioned     PT Goals (current goals can now be found in the care plan section) Acute Rehab PT Goals Patient Stated Goal: rehab then back to [facility] Progress towards PT goals:  Progressing toward  goals   Frequency    7X/week   PT Plan Current plan remains appropriate    Co-evaluation     PT goals addressed during session: Mobility/safety with mobility;Balance;Proper use of DME;Strengthening/ROM     AM-PAC PT "6 Clicks" Mobility   Outcome Measure  Help needed turning from your back to your side while in a flat bed without using bedrails?: A Little Help needed moving from lying on your back to sitting on the side of a flat bed without using bedrails?: A Little Help needed moving to and from a bed to a chair (including a wheelchair)?: A Little Help needed standing up from a chair using your arms (e.g., wheelchair or bedside chair)?: A Little Help needed to walk in hospital room?: A Little Help needed climbing 3-5 steps with a railing? : A Lot 6 Click Score: 17    End of Session   Activity Tolerance: Patient tolerated treatment well Patient left: in chair;with call bell/phone within reach;with chair alarm set Nurse Communication: Mobility status PT Visit Diagnosis: Unsteadiness on feet (R26.81);Muscle weakness (generalized) (M62.81);History of falling (Z91.81);Difficulty in walking, not elsewhere classified (R26.2);Pain Pain - Right/Left: Right Pain - part of body: Hip     Time: 0742-0810 PT Time Calculation (min) (ACUTE ONLY): 28 min  Charges:  $Gait Training: 8-22 mins $Therapeutic Exercise: 8-22 mins                    Jetta Lout PTA 01/26/23, 11:21 AM

## 2023-01-26 NOTE — Plan of Care (Signed)
  Problem: Education: Goal: Knowledge of General Education information will improve Description: Including pain rating scale, medication(s)/side effects and non-pharmacologic comfort measures Outcome: Progressing   Problem: Health Behavior/Discharge Planning: Goal: Ability to manage health-related needs will improve Outcome: Progressing   Problem: Clinical Measurements: Goal: Ability to maintain clinical measurements within normal limits will improve Outcome: Progressing Goal: Will remain free from infection Outcome: Progressing Goal: Diagnostic test results will improve Outcome: Progressing Goal: Respiratory complications will improve Outcome: Progressing Goal: Cardiovascular complication will be avoided Outcome: Progressing   Problem: Activity: Goal: Risk for activity intolerance will decrease Outcome: Progressing   Problem: Nutrition: Goal: Adequate nutrition will be maintained Outcome: Progressing   Problem: Coping: Goal: Level of anxiety will decrease Outcome: Progressing   Problem: Elimination: Goal: Will not experience complications related to bowel motility Outcome: Progressing Goal: Will not experience complications related to urinary retention Outcome: Progressing   Problem: Pain Managment: Goal: General experience of comfort will improve Outcome: Progressing   Problem: Safety: Goal: Ability to remain free from injury will improve Outcome: Progressing   Problem: Skin Integrity: Goal: Risk for impaired skin integrity will decrease Outcome: Progressing   Problem: Education: Goal: Ability to describe self-care measures that may prevent or decrease complications (Diabetes Survival Skills Education) will improve Outcome: Progressing Goal: Individualized Educational Video(s) Outcome: Progressing   Problem: Coping: Goal: Ability to adjust to condition or change in health will improve Outcome: Progressing   Problem: Fluid Volume: Goal: Ability to  maintain a balanced intake and output will improve Outcome: Progressing   Problem: Health Behavior/Discharge Planning: Goal: Ability to identify and utilize available resources and services will improve Outcome: Progressing Goal: Ability to manage health-related needs will improve Outcome: Progressing   Problem: Metabolic: Goal: Ability to maintain appropriate glucose levels will improve Outcome: Progressing   Problem: Nutritional: Goal: Maintenance of adequate nutrition will improve Outcome: Progressing Goal: Progress toward achieving an optimal weight will improve Outcome: Progressing   Problem: Skin Integrity: Goal: Risk for impaired skin integrity will decrease Outcome: Progressing   Problem: Tissue Perfusion: Goal: Adequacy of tissue perfusion will improve Outcome: Progressing   Problem: Education: Goal: Knowledge of risk factors and measures for prevention of condition will improve Outcome: Progressing   Problem: Coping: Goal: Psychosocial and spiritual needs will be supported Outcome: Progressing   Problem: Respiratory: Goal: Will maintain a patent airway Outcome: Progressing Goal: Complications related to the disease process, condition or treatment will be avoided or minimized Outcome: Progressing   

## 2023-01-26 NOTE — TOC Progression Note (Signed)
Transition of Care [facility]) - Progression Note    Patient Details  Name: Deborah Obrien MRN: [id number] Date of Birth: 1940/11/15  [facility]) CM/SW Contact  Marlowe Sax, RN Phone Number: 01/26/2023, 10:24 AM  Clinical Narrative:   Spoke with the patient and her son Deborah Obrien reviewed the bed offers, they chose [facility], she is a resident at [facility], she is A symptomatic for covid, tested pos on the 12th, I reached out to [facility] and they will are stating she has to be 10 days before coming    Expected Discharge Plan: Skilled Nursing Facility Barriers to Discharge: Continued Medical Work up  Expected Discharge Plan and Services       Living arrangements for the past 2 months: Assisted Living Facility ( house)                                       Social Determinants of Health (SDOH) Interventions SDOH Screenings   Food Insecurity: No Food Insecurity (01/20/2023)  Housing: Low Risk  (01/20/2023)  Transportation Needs: No Transportation Needs (01/20/2023)  Utilities: Not At Risk (01/20/2023)  Tobacco Use: Low Risk  (01/23/2023)    Readmission Risk Interventions     No data to display

## 2023-01-27 DIAGNOSIS — K219 Gastro-esophageal reflux disease without esophagitis: Secondary | ICD-10-CM | POA: Diagnosis not present

## 2023-01-27 DIAGNOSIS — S72001A Fracture of unspecified part of neck of right femur, initial encounter for closed fracture: Secondary | ICD-10-CM | POA: Diagnosis not present

## 2023-01-27 DIAGNOSIS — I25118 Atherosclerotic heart disease of native coronary artery with other forms of angina pectoris: Secondary | ICD-10-CM | POA: Diagnosis not present

## 2023-01-27 DIAGNOSIS — I4821 Permanent atrial fibrillation: Secondary | ICD-10-CM | POA: Diagnosis not present

## 2023-01-27 LAB — GLUCOSE, CAPILLARY
Glucose-Capillary: 112 mg/dL — ABNORMAL HIGH (ref 70–99)
Glucose-Capillary: 127 mg/dL — ABNORMAL HIGH (ref 70–99)
Glucose-Capillary: 128 mg/dL — ABNORMAL HIGH (ref 70–99)
Glucose-Capillary: 149 mg/dL — ABNORMAL HIGH (ref 70–99)
Glucose-Capillary: 186 mg/dL — ABNORMAL HIGH (ref 70–99)
Glucose-Capillary: 74 mg/dL (ref 70–99)

## 2023-01-27 MED ORDER — AMIODARONE HCL 200 MG PO TABS
200.0000 mg | ORAL_TABLET | Freq: Every day | ORAL | Status: DC
  Administered 2023-01-28 – 2023-02-01 (×5): 200 mg via ORAL
  Filled 2023-01-27 (×5): qty 1

## 2023-01-27 NOTE — Progress Notes (Signed)
[facility]  - [place] at [facility]   PATIENT NAME: Deborah Obrien    MR#:  [id number]  DATE OF BIRTH:  1940-10-09  SUBJECTIVE:   Patient working with physical therapy overall doing well. Had good bowel movement per her. Feels a lot better. She tells me her surgical site is oozing fluid  VITALS:  Blood pressure (!) 115/45, pulse 60, temperature 97.7 F (36.5 C), resp. rate 18, height 5\' 3"  (1.6 m), weight 78.9 kg, SpO2 100 %.  PHYSICAL EXAMINATION:   GENERAL:  82 y.o.-year-old patient with no acute distress.  LUNGS: Normal breath sounds bilaterally, no wheezing CARDIOVASCULAR: S1, S2 normal. No murmur   ABDOMEN: Soft, nontender, nondistended. Bowel sounds present.  EXTREMITIES: No  edema b/l.   Right hip dressing + NEUROLOGIC: nonfocal  patient is alert and awake. weak  LABORATORY PANEL:  CBC Recent Labs  Lab 01/24/23 0356 01/24/23 1219 01/25/23 0404  WBC 5.7  --   --   HGB 8.4*   < > 8.9*  HCT 26.3*   < > 28.3*  PLT 119*  --   --    < > = values in this interval not displayed.     Chemistries  Recent Labs  Lab 01/25/23 0404  NA 138  K 4.0  CL 108  CO2 24  GLUCOSE 238*  BUN 45*  CREATININE 1.39*  CALCIUM 8.5*  MG 1.9  AST 39  ALT 12  ALKPHOS 144*  BILITOT 1.1    Assessment and Plan  82 y.o. 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 Patient came to the emergency room after she had a fall at [facility]. She underwent intramedullary nailing of right femur with cephalomedullary device by Dr Allena Katz on 01/21/2023 patient was transferred to ICU for hypovolemia shock. She required IV vasopressors-- now weaned off.  Shock-- hypovolemic/hypotensive --was placed on IV  vasopressors--weaned off --cont Midodrine --D/c stress dose steroids  Acute hypercapnia/hypoxic respiratory failure -- PRN BiPAP -- resolved -- patient on room air  Acute kidney injury superimposed on CKD stage IV -- Lasix on hold -- improved -- seen by Dr. Wynelle Link -- replace electrolytes as indicated -- will resume Lasix 20 mg daily since creatinine trending down. Discussed with Dr. Wynelle Link  Right intertrochanteric fracture status post mechanical fall status post intramedullary nailing -- patient followed by orthopedic Dr. Signa Kell -- continue PT OT. TOC for discharge planning to rehab  Anemia without signs of active bleeding -- hemoglobin stable  paroxysmal atrial fibrillation -- appreciate [facility] MG cardiology input-- [facility] MG signed off. Follow-up outpatient 3 to 4 weeks after discharge with Dr. Graciela Husbands -- continue amiodarone -- continue apixaban  COVID-19 infection -- asymptomatic -- supportive care  Procedures right hip surgery Family communication : son Deborah Obrien on  the phone Consults : orthopedic, nephrology, cardiology CODE STATUS: full DVT Prophylaxis : eliquis Level of care: Telemetry Medical Status is: Inpatient Remains inpatient appropriate because: PT OT and discharge planning  Patient is medically stable for discharge. She has bed offer at [facility]. Per TOC given COVID she will have to stay for 10 days before discharge.    TOTAL TIME TAKING CARE OF THIS PATIENT: 35 minutes.  >50% time spent on counselling and coordination of care  Note: This dictation was prepared with Dragon dictation along  with smaller phrase technology. Any transcriptional errors that result from this process are unintentional.  Enedina Finner M.D    [facility]   CC: Primary care physician; Melonie Florida, FNP

## 2023-01-27 NOTE — Progress Notes (Signed)
Physical Therapy Treatment Patient Details Name: Deborah Obrien MRN: [id number] DOB: [birth year] Today's Date: 01/27/2023   History of Present Illness Pt admitted for shock secondary to R hip fracture and is s/p R hip IM nailing. PT evaluation on POD4. History includes pacemaker, Afib, HTN, HLD, CAD and stage 4 kidney disease.    PT Comments    Pt in chair on arrival, just worked with OT. Pt walked through HEP handout, 5 reps each, omitted those performed in supine for later in day once back top bed. Pt reports adequate pain control, able to walk a little farther today, remains in good spirits overall. Safe use of RW for transfers from recliner. Pt quite SOB after 54ft AMB, elevated RR- SpO2 99% and HR in 60s. WIll continue to follow and progress as able. Encouraged to work on [employer] later in day 2 more times.   Recommendations for follow up therapy are one component of a multi-disciplinary discharge planning process, led by the attending physician.  Recommendations may be updated based on patient status, additional functional criteria and insurance authorization.  Follow Up Recommendations  Can patient physically be transported by private vehicle: Yes    Assistance Recommended at Discharge Set up Supervision/Assistance  Patient can return home with the following A little help with walking and/or transfers;A little help with bathing/dressing/bathroom;Assistance with cooking/housework;Assist for transportation;Help with stairs or ramp for entrance   Equipment Recommendations       Recommendations for Other Services       Precautions / Restrictions Precautions Precautions: Fall Precaution Comments: airborne/contact precautions 2/2 Covid+ Restrictions Weight Bearing Restrictions: No RLE Weight Bearing: Weight bearing as tolerated     Mobility  Bed Mobility                    Transfers Overall transfer level: Needs assistance Equipment used: Rolling walker (2  wheels) Transfers: Sit to/from Stand Sit to Stand: Supervision           General transfer comment: from recliner    Ambulation/Gait Ambulation/Gait assistance: Supervision Gait Distance (Feet): 50 Feet Assistive device: Rolling walker (2 wheels) Gait Pattern/deviations: Step-to pattern, Antalgic Gait velocity: 0.46m/s     General Gait Details: Pt was able to ambulate in room to doorway of room and return. distance limited by fatigue but overall pt tolerated well   Stairs             Wheelchair Mobility    Modified Rankin (Stroke Patients Only)       Balance                                            Cognition                                                Exercises General Exercises - Lower Extremity Ankle Circles/Pumps: AROM, Both, 5 reps Quad Sets: AROM, Both, 5 reps Long Arc Quad: AROM, 5 reps, Right Hip Flexion/Marching: AROM, Both, 5 reps    General Comments General comments (skin integrity, edema, etc.): Pt able to walk around the bed to the chair at end of session, slow with RW CGA, pt breathing heavily; once seated, OT checked O2 and sats 100% on room air.  Pertinent Vitals/Pain Pain Assessment Pain Assessment: Faces Pain Score: 4  Pain Descriptors / Indicators: Aching Pain Intervention(s): Premedicated before session    Home Living                          Prior Function            PT Goals (current goals can now be found in the care plan section) Acute Rehab PT Goals Patient Stated Goal: rehab then back to [facility] PT Goal Formulation: With patient Time For Goal Achievement: 02/08/23 Potential to Achieve Goals: Good Additional Goals Additional Goal #1: Pt will be able to perform bed mobility/transfers with mod I and safe technique in order to improve functional independence Progress towards PT goals: Progressing toward goals    Frequency    7X/week      PT Plan  Current plan remains appropriate    Co-evaluation              AM-PAC PT "6 Clicks" Mobility   Outcome Measure  Help needed turning from your back to your side while in a flat bed without using bedrails?: A Little Help needed moving from lying on your back to sitting on the side of a flat bed without using bedrails?: A Little Help needed moving to and from a bed to a chair (including a wheelchair)?: A Little Help needed standing up from a chair using your arms (e.g., wheelchair or bedside chair)?: A Little Help needed to walk in hospital room?: A Little Help needed climbing 3-5 steps with a railing? : A Lot 6 Click Score: 17    End of Session Equipment Utilized During Treatment: Gait belt Activity Tolerance: Patient tolerated treatment well;Patient limited by pain;No increased pain Patient left: in chair;with call bell/phone within reach;with chair alarm set Nurse Communication: Mobility status PT Visit Diagnosis: Unsteadiness on feet (R26.81);Muscle weakness (generalized) (M62.81);History of falling (Z91.81);Difficulty in walking, not elsewhere classified (R26.2);Pain Pain - Right/Left: Right Pain - part of body: Hip     Time: 1610-9604 PT Time Calculation (min) (ACUTE ONLY): 17 min  Charges:  $Therapeutic Exercise: 8-22 mins                    12:37 PM, 01/27/23 Rosamaria Lints, PT, DPT Physical Therapist - [facility]  [phone] (ASCOM)    Kip Cropp C 01/27/2023, 12:35 PM

## 2023-01-27 NOTE — Plan of Care (Signed)
  Problem: Education: Goal: Knowledge of General Education information will improve Description: Including pain rating scale, medication(s)/side effects and non-pharmacologic comfort measures Outcome: Progressing   Problem: Health Behavior/Discharge Planning: Goal: Ability to manage health-related needs will improve Outcome: Progressing   Problem: Clinical Measurements: Goal: Ability to maintain clinical measurements within normal limits will improve Outcome: Progressing Goal: Will remain free from infection Outcome: Progressing Goal: Diagnostic test results will improve Outcome: Progressing Goal: Respiratory complications will improve Outcome: Progressing Goal: Cardiovascular complication will be avoided Outcome: Progressing   Problem: Activity: Goal: Risk for activity intolerance will decrease Outcome: Progressing   Problem: Nutrition: Goal: Adequate nutrition will be maintained Outcome: Progressing   Problem: Coping: Goal: Level of anxiety will decrease Outcome: Progressing   Problem: Elimination: Goal: Will not experience complications related to bowel motility Outcome: Progressing Goal: Will not experience complications related to urinary retention Outcome: Progressing   Problem: Pain Managment: Goal: General experience of comfort will improve Outcome: Progressing   Problem: Safety: Goal: Ability to remain free from injury will improve Outcome: Progressing   Problem: Skin Integrity: Goal: Risk for impaired skin integrity will decrease Outcome: Progressing   Problem: Education: Goal: Ability to describe self-care measures that may prevent or decrease complications (Diabetes Survival Skills Education) will improve Outcome: Progressing Goal: Individualized Educational Video(s) Outcome: Progressing   Problem: Coping: Goal: Ability to adjust to condition or change in health will improve Outcome: Progressing   Problem: Fluid Volume: Goal: Ability to  maintain a balanced intake and output will improve Outcome: Progressing   Problem: Health Behavior/Discharge Planning: Goal: Ability to identify and utilize available resources and services will improve Outcome: Progressing Goal: Ability to manage health-related needs will improve Outcome: Progressing   Problem: Metabolic: Goal: Ability to maintain appropriate glucose levels will improve Outcome: Progressing   Problem: Nutritional: Goal: Maintenance of adequate nutrition will improve Outcome: Progressing Goal: Progress toward achieving an optimal weight will improve Outcome: Progressing   Problem: Skin Integrity: Goal: Risk for impaired skin integrity will decrease Outcome: Progressing   Problem: Tissue Perfusion: Goal: Adequacy of tissue perfusion will improve Outcome: Progressing   Problem: Education: Goal: Knowledge of risk factors and measures for prevention of condition will improve Outcome: Progressing   Problem: Coping: Goal: Psychosocial and spiritual needs will be supported Outcome: Progressing   Problem: Respiratory: Goal: Will maintain a patent airway Outcome: Progressing Goal: Complications related to the disease process, condition or treatment will be avoided or minimized Outcome: Progressing   

## 2023-01-27 NOTE — TOC Progression Note (Signed)
Transition of Care [facility]) - Progression Note    Patient Details  Name: Deborah Obrien MRN: [id number] Date of Birth: 1941/06/20  [facility]) CM/SW Contact  Marlowe Sax, RN Phone Number: 01/27/2023, 12:34 PM  Clinical Narrative:    Caryn Bee called back reviewed bed offers with son Caryn Bee, they want [facility], Will not be able to DC until 5/22 due to covid, will reach out to [facility] Tuesday for a bed  Expected Discharge Plan: Skilled Nursing Facility Barriers to Discharge: Continued Medical Work up  Expected Discharge Plan and Services       Living arrangements for the past 2 months: Assisted Living Facility ([facility])                                       Social Determinants of Health (SDOH) Interventions SDOH Screenings   Food Insecurity: No Food Insecurity (01/20/2023)  Housing: Low Risk  (01/20/2023)  Transportation Needs: No Transportation Needs (01/20/2023)  Utilities: Not At Risk (01/20/2023)  Tobacco Use: Low Risk  (01/23/2023)    Readmission Risk Interventions     No data to display

## 2023-01-27 NOTE — Progress Notes (Signed)
Occupational Therapy Treatment Patient Details Name: Deborah Obrien MRN: [id number] DOB: 1941-08-14 Today's Date: 01/27/2023   History of present illness Pt admitted for shock secondary to R hip fracture and is s/p R hip IM nailing. Currently POD 4 at day of evaluation. Cleared to work with therapy per medical team. History includes pacemaker, Afib, HTN, HLD, CAD and stage 4 kidney disease.   OT comments  Pt received semi-reclined in bed; stating she has to urinate; unable to hold urine and pt urinated on bed pad; then able to t/f to EOB with extra time SBA; t/f to [facility] and pt able to have small bowel movement.  See flowsheet below for further details of session. Left seated in recliner, BIL LE elevated, chair alarm on, with all needs in reach.  Patient will benefit from continued OT while in acute care.    Recommendations for follow up therapy are one component of a multi-disciplinary discharge planning process, led by the attending physician.  Recommendations may be updated based on patient status, additional functional criteria and insurance authorization.    Assistance Recommended at Discharge Frequent or constant Supervision/Assistance  Patient can return home with the following  A little help with walking and/or transfers;A little help with bathing/dressing/bathroom;Assistance with cooking/housework;Direct supervision/assist for medications management;Direct supervision/assist for financial management;Assist for transportation   Equipment Recommendations  Other (comment) (defer to next venue of care)    Recommendations for Other Services      Precautions / Restrictions Precautions Precautions: Fall Precaution Comments: airborne/contact precautions 2/2 Covid+ Restrictions Weight Bearing Restrictions: No RLE Weight Bearing: Weight bearing as tolerated       Mobility Bed Mobility Overal bed mobility: Needs Assistance Bed Mobility: Supine to Sit     Supine to sit: Min  guard, Supervision     General bed mobility comments: lots of extra time, HOB elevated, use of rails    Transfers Overall transfer level: Needs assistance Equipment used: Rolling walker (2 wheels) Transfers: Sit to/from Stand Sit to Stand: Min guard, From elevated surface           General transfer comment: OT had to elevate bed for pt to achieve standing     Balance Overall balance assessment: Needs assistance, History of Falls Sitting-balance support: Feet supported Sitting balance-Leahy Scale: Good     Standing balance support: Bilateral upper extremity supported, Reliant on assistive device for balance, During functional activity Standing balance-Leahy Scale: Fair                             ADL either performed or assessed with clinical judgement   ADL Overall ADL's : Needs assistance/impaired     Grooming: Oral care;Min guard;Standing Grooming Details (indicate cue type and reason): at sink for approx 2 minutes, leaning on forearms on countertop                 Toilet Transfer: Min guard;BSC/3in1;Rolling walker (2 wheels) Toilet Transfer Details (indicate cue type and reason): with RW Toileting- Clothing Manipulation and Hygiene: Maximal assistance;Sit to/from stand Toileting - Clothing Manipulation Details (indicate cue type and reason): OT assisted while pt stood at [employer]     Functional mobility during ADLs: Min guard;Rolling walker (2 wheels)      Extremity/Trunk Assessment Upper Extremity Assessment Upper Extremity Assessment: Overall WFL for tasks assessed   Lower Extremity Assessment Lower Extremity Assessment: Defer to PT evaluation        Vision  Perception     Praxis      Cognition Arousal/Alertness: Awake/alert Behavior During Therapy: WFL for tasks assessed/performed Overall Cognitive Status: Within Functional Limits for tasks assessed                                 General Comments: Pleasant and  motivated.        Exercises      Shoulder Instructions       General Comments Pt able to walk around the bed to the chair at end of session, slow with RW CGA, pt breathing heavily; once seated, OT checked O2 and sats 100% on room air.    Pertinent Vitals/ Pain       Pain Assessment Pain Assessment: 0-10 Pain Score: 5  Pain Location: R hip while seated Pain Descriptors / Indicators: Aching Pain Intervention(s): Limited activity within patient's tolerance, Monitored during session, Premedicated before session  Home Living                                          Prior Functioning/Environment              Frequency  Min 1X/week        Progress Toward Goals  OT Goals(current goals can now be found in the care plan section)  Progress towards OT goals: Progressing toward goals  Acute Rehab OT Goals Patient Stated Goal: Get better OT Goal Formulation: With patient Time For Goal Achievement: 02/08/23 Potential to Achieve Goals: Good ADL Goals Pt Will Perform Grooming: with modified independence;standing Pt Will Perform Lower Body Dressing: with modified independence;sit to/from stand Pt Will Transfer to Toilet: with modified independence;bedside commode;ambulating Pt Will Perform Toileting - Clothing Manipulation and hygiene: with modified independence;sit to/from stand  Plan Discharge plan remains appropriate    Co-evaluation                 AM-PAC OT "6 Clicks" Daily Activity     Outcome Measure   Help from another person eating meals?: None Help from another person taking care of personal grooming?: A Little Help from another person toileting, which includes using toliet, bedpan, or urinal?: A Lot Help from another person bathing (including washing, rinsing, drying)?: A Lot Help from another person to put on and taking off regular upper body clothing?: A Little Help from another person to put on and taking off regular lower body  clothing?: A Lot 6 Click Score: 16    End of Session Equipment Utilized During Treatment: Rolling walker (2 wheels)  OT Visit Diagnosis: History of falling (Z91.81)   Activity Tolerance Patient tolerated treatment well   Patient Left in chair;with call bell/phone within reach;with chair alarm set   Nurse Communication Mobility status;Other (comment) (Pt needs new bed linens and pt requesting tweezers)        Time: 1610-9604 OT Time Calculation (min): 32 min  Charges: OT General Charges $OT Visit: 1 Visit OT Treatments $Self Care/Home Management : 23-37 mins  Linward Foster, MS, OTR/L   Alvester Morin 01/27/2023, 11:09 AM

## 2023-01-27 NOTE — TOC Progression Note (Signed)
Transition of Care [facility]) - Progression Note    Patient Details  Name: Deborah Obrien MRN: [id number] Date of Birth: 1940/12/21  Transition of Care [facility]) CM/SW Contact  Marlowe Sax, RN Phone Number: 01/27/2023, 12:29 PM  Clinical Narrative:    Attempted to reach the patient's son Caryn Bee , received a digital answering service, I left a VM asking for a call back to review bed offers   Expected Discharge Plan: Skilled Nursing Facility Barriers to Discharge: Continued Medical Work up  Expected Discharge Plan and Services       Living arrangements for the past 2 months: Assisted Living Facility ([facility])                                       Social Determinants of Health (SDOH) Interventions SDOH Screenings   Food Insecurity: No Food Insecurity (01/20/2023)  Housing: Low Risk  (01/20/2023)  Transportation Needs: No Transportation Needs (01/20/2023)  Utilities: Not At Risk (01/20/2023)  Tobacco Use: Low Risk  (01/23/2023)    Readmission Risk Interventions     No data to display

## 2023-01-28 DIAGNOSIS — I25118 Atherosclerotic heart disease of native coronary artery with other forms of angina pectoris: Secondary | ICD-10-CM | POA: Diagnosis not present

## 2023-01-28 DIAGNOSIS — I4821 Permanent atrial fibrillation: Secondary | ICD-10-CM | POA: Diagnosis not present

## 2023-01-28 DIAGNOSIS — S72001A Fracture of unspecified part of neck of right femur, initial encounter for closed fracture: Secondary | ICD-10-CM | POA: Diagnosis not present

## 2023-01-28 DIAGNOSIS — K219 Gastro-esophageal reflux disease without esophagitis: Secondary | ICD-10-CM | POA: Diagnosis not present

## 2023-01-28 LAB — GLUCOSE, CAPILLARY
Glucose-Capillary: 104 mg/dL — ABNORMAL HIGH (ref 70–99)
Glucose-Capillary: 106 mg/dL — ABNORMAL HIGH (ref 70–99)
Glucose-Capillary: 110 mg/dL — ABNORMAL HIGH (ref 70–99)
Glucose-Capillary: 205 mg/dL — ABNORMAL HIGH (ref 70–99)
Glucose-Capillary: 206 mg/dL — ABNORMAL HIGH (ref 70–99)
Glucose-Capillary: 259 mg/dL — ABNORMAL HIGH (ref 70–99)

## 2023-01-28 MED ORDER — MIDODRINE HCL 5 MG PO TABS
5.0000 mg | ORAL_TABLET | Freq: Three times a day (TID) | ORAL | Status: DC
  Administered 2023-01-28 – 2023-02-01 (×12): 5 mg via ORAL
  Filled 2023-01-28 (×12): qty 1

## 2023-01-28 NOTE — Progress Notes (Signed)
[facility]  - [place] at [facility]   PATIENT NAME: Deborah Obrien    MR#:  [id number]  DATE OF BIRTH:  [birth year]  SUBJECTIVE:   Patient working with physical therapy overall doing well.Went out of the room for walk  Feels a lot better.  Per RN surgical site ok  VITALS:  Blood pressure 115/76, pulse 62, temperature 98.2 F (36.8 C), resp. rate 16, height 5\' 3"  (1.6 m), weight 77.5 kg, SpO2 99 %.  PHYSICAL EXAMINATION:   GENERAL:  82 y.o.-year-old patient with no acute distress.  LUNGS: Normal breath sounds bilaterally, no wheezing CARDIOVASCULAR: S1, S2 normal. No murmur   ABDOMEN: Soft, nontender, nondistended. Bowel sounds present.  EXTREMITIES: No  edema b/l.   Right hip dressing + NEUROLOGIC: nonfocal  patient is alert and awake. weak  LABORATORY PANEL:  CBC Recent Labs  Lab 01/24/23 0356 01/24/23 1219 01/25/23 0404  WBC 5.7  --   --   HGB 8.4*   < > 8.9*  HCT 26.3*   < > 28.3*  PLT 119*  --   --    < > = values in this interval not displayed.     Chemistries  Recent Labs  Lab 01/25/23 0404  NA 138  K 4.0  CL 108  CO2 24  GLUCOSE 238*  BUN 45*  CREATININE 1.39*  CALCIUM 8.5*  MG 1.9  AST 39  ALT 12  ALKPHOS 144*  BILITOT 1.1    Assessment and Plan  82 y.o. 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 Patient came to the emergency room after she had a fall at [facility]. She underwent intramedullary nailing of right femur with cephalomedullary device by Dr Allena Katz on 01/21/2023 patient was transferred to ICU for hypovolemia shock. She required IV vasopressors-- now weaned off.  Right intertrochanteric fracture status post mechanical fall status post  intramedullary nailing -- patient followed by orthopedic Dr. Signa Kell -- continue PT OT. TOC for discharge planning to rehab  Shock-- hypovolemic/hypotensive --was placed on IV vasopressors--weaned off --cont Midodrine--trial to wean midodrine --resolved  Acute hypercapnia/hypoxic respiratory failure -- PRN BiPAP -- resolved -- patient on room air  Acute kidney injury superimposed on CKD stage IV -- Lasix on hold -- improved -- seen by Dr. Wynelle Link -- replace electrolytes as indicated -- will resume Lasix 20 mg daily since creatinine trending down. Discussed with Dr. Wynelle Link  Anemia without signs of active bleeding -- hemoglobin stable  paroxysmal atrial fibrillation -- appreciate [facility] MG cardiology input-- [facility] MG signed off. Follow-up outpatient 3 to 4 weeks after discharge with Dr. Graciela Husbands -- continue amiodarone -- continue apixaban  COVID-19 infection -- asymptomatic -- supportive care  Procedures right hip surgery Family communication : none today Consults : orthopedic, nephrology, cardiology CODE STATUS: full DVT Prophylaxis : eliquis Level of care: Telemetry Medical Status is: Inpatient Remains inpatient appropriate because: PT OT and discharge planning  Patient is medically stable for discharge. She has bed offer at [facility]. Per TOC given COVID she will have to stay for 10 days before discharge.    TOTAL TIME TAKING CARE OF THIS PATIENT: 35 minutes.  >50% time spent on counselling and coordination of care  Note: This dictation was prepared with Dragon dictation along with smaller phrase technology. Any transcriptional errors  that result from this process are unintentional.  Deborah Obrien M.D    [facility]   CC: Primary care physician; Melonie Florida, FNP

## 2023-01-28 NOTE — Plan of Care (Signed)
  Problem: Education: Goal: Knowledge of General Education information will improve Description: Including pain rating scale, medication(s)/side effects and non-pharmacologic comfort measures Outcome: Progressing   Problem: Health Behavior/Discharge Planning: Goal: Ability to manage health-related needs will improve Outcome: Progressing   Problem: Clinical Measurements: Goal: Ability to maintain clinical measurements within normal limits will improve Outcome: Progressing Goal: Will remain free from infection Outcome: Progressing Goal: Diagnostic test results will improve Outcome: Progressing Goal: Respiratory complications will improve Outcome: Progressing Goal: Cardiovascular complication will be avoided Outcome: Progressing   Problem: Activity: Goal: Risk for activity intolerance will decrease Outcome: Progressing   Problem: Nutrition: Goal: Adequate nutrition will be maintained Outcome: Progressing   Problem: Coping: Goal: Level of anxiety will decrease Outcome: Progressing   Problem: Elimination: Goal: Will not experience complications related to bowel motility Outcome: Progressing Goal: Will not experience complications related to urinary retention Outcome: Progressing   Problem: Pain Managment: Goal: General experience of comfort will improve Outcome: Progressing   Problem: Safety: Goal: Ability to remain free from injury will improve Outcome: Progressing   Problem: Skin Integrity: Goal: Risk for impaired skin integrity will decrease Outcome: Progressing   Problem: Education: Goal: Ability to describe self-care measures that may prevent or decrease complications (Diabetes Survival Skills Education) will improve Outcome: Progressing Goal: Individualized Educational Video(s) Outcome: Progressing   Problem: Coping: Goal: Ability to adjust to condition or change in health will improve Outcome: Progressing   Problem: Fluid Volume: Goal: Ability to  maintain a balanced intake and output will improve Outcome: Progressing   Problem: Health Behavior/Discharge Planning: Goal: Ability to identify and utilize available resources and services will improve Outcome: Progressing Goal: Ability to manage health-related needs will improve Outcome: Progressing   Problem: Metabolic: Goal: Ability to maintain appropriate glucose levels will improve Outcome: Progressing   Problem: Nutritional: Goal: Maintenance of adequate nutrition will improve Outcome: Progressing Goal: Progress toward achieving an optimal weight will improve Outcome: Progressing   Problem: Skin Integrity: Goal: Risk for impaired skin integrity will decrease Outcome: Progressing   Problem: Tissue Perfusion: Goal: Adequacy of tissue perfusion will improve Outcome: Progressing   Problem: Education: Goal: Knowledge of risk factors and measures for prevention of condition will improve Outcome: Progressing   Problem: Coping: Goal: Psychosocial and spiritual needs will be supported Outcome: Progressing   Problem: Respiratory: Goal: Will maintain a patent airway Outcome: Progressing Goal: Complications related to the disease process, condition or treatment will be avoided or minimized Outcome: Progressing   

## 2023-01-28 NOTE — Progress Notes (Signed)
Physical Therapy Treatment Patient Details Name: Deborah Obrien MRN: [id number] DOB: 05-10-1941 Today's Date: 01/28/2023   History of Present Illness Pt admitted for shock secondary to R hip fracture and is s/p R hip IM nailing. PT evaluation on POD4. History includes pacemaker, Afib, HTN, HLD, CAD and stage 4 kidney disease.    PT Comments    Patient received up in recliner. Reports she is doing okay. Moderate pain in right hip. Patient is supervision for sit to stand from recliner. She is min guard to supervision for ambulation out to nurses desk and back to recliner. Patient limited by fatigue and pain. Patient will continue to benefit from skilled PT to improve functional independence safety and strength.    Recommendations for follow up therapy are one component of a multi-disciplinary discharge planning process, led by the attending physician.  Recommendations may be updated based on patient status, additional functional criteria and insurance authorization.  Follow Up Recommendations  Can patient physically be transported by private vehicle: Yes    Assistance Recommended at Discharge Intermittent Supervision/Assistance  Patient can return home with the following A little help with walking and/or transfers;A little help with bathing/dressing/bathroom;Assist for transportation;Help with stairs or ramp for entrance   Equipment Recommendations  None recommended by PT    Recommendations for Other Services       Precautions / Restrictions Precautions Precautions: Fall Precaution Comments: airborne/contact precautions 2/2 Covid+ Restrictions Weight Bearing Restrictions: No RLE Weight Bearing: Weight bearing as tolerated     Mobility  Bed Mobility               General bed mobility comments: NT, patient in recliner at beginning of session    Transfers Overall transfer level: Needs assistance Equipment used: Rolling walker (2 wheels) Transfers: Sit to/from  Stand Sit to Stand: Supervision                Ambulation/Gait Ambulation/Gait assistance: Supervision Gait Distance (Feet): 50 Feet Assistive device: Rolling walker (2 wheels) Gait Pattern/deviations: Step-to pattern, Decreased step length - right, Decreased step length - left, Decreased stride length, Trunk flexed Gait velocity: decr     General Gait Details: distance limited by fatigue. supervision with RW.   Stairs             Wheelchair Mobility    Modified Rankin (Stroke Patients Only)       Balance Overall balance assessment: Needs assistance, History of Falls Sitting-balance support: Feet supported Sitting balance-Leahy Scale: Good     Standing balance support: Bilateral upper extremity supported, During functional activity, Reliant on assistive device for balance Standing balance-Leahy Scale: Fair                              Cognition Arousal/Alertness: Awake/alert Behavior During Therapy: WFL for tasks assessed/performed Overall Cognitive Status: Within Functional Limits for tasks assessed                                 General Comments: Pleasant and motivated.        Exercises Total Joint Exercises Ankle Circles/Pumps: AROM, Both, 10 reps Hip ABduction/ADduction: AAROM, Right, 10 reps Long Arc Quad: AROM, Right, 10 reps    General Comments        Pertinent Vitals/Pain Pain Assessment Pain Assessment: 0-10 Pain Score: 5  Pain Descriptors / Indicators: Discomfort, Sore Pain Intervention(s): Repositioned, Monitored  during session    Home Living                          Prior Function            PT Goals (current goals can now be found in the care plan section) Acute Rehab PT Goals Patient Stated Goal: rehab then back to [facility] PT Goal Formulation: With patient Time For Goal Achievement: 02/08/23 Potential to Achieve Goals: Good Additional Goals Additional Goal #1: Pt will be  able to perform bed mobility/transfers with mod I and safe technique in order to improve functional independence Progress towards PT goals: Progressing toward goals    Frequency    7X/week      PT Plan Current plan remains appropriate    Co-evaluation              AM-PAC PT "6 Clicks" Mobility   Outcome Measure  Help needed turning from your back to your side while in a flat bed without using bedrails?: A Little Help needed moving from lying on your back to sitting on the side of a flat bed without using bedrails?: A Little Help needed moving to and from a bed to a chair (including a wheelchair)?: A Little Help needed standing up from a chair using your arms (e.g., wheelchair or bedside chair)?: A Little Help needed to walk in hospital room?: A Little Help needed climbing 3-5 steps with a railing? : A Lot 6 Click Score: 17    End of Session Equipment Utilized During Treatment: Gait belt Activity Tolerance: Patient limited by fatigue Patient left: in chair;with call bell/phone within reach;with chair alarm set Nurse Communication: Mobility status PT Visit Diagnosis: Other abnormalities of gait and mobility (R26.89);Muscle weakness (generalized) (M62.81);Difficulty in walking, not elsewhere classified (R26.2);Pain Pain - Right/Left: Right Pain - part of body: Hip     Time: 6045-4098 PT Time Calculation (min) (ACUTE ONLY): 22 min  Charges:  $Gait Training: 8-22 mins                     Michael Walrath, PT, GCS 01/28/23,11:05 AM

## 2023-01-29 DIAGNOSIS — Z515 Encounter for palliative care: Secondary | ICD-10-CM

## 2023-01-29 DIAGNOSIS — I5032 Chronic diastolic (congestive) heart failure: Secondary | ICD-10-CM | POA: Diagnosis not present

## 2023-01-29 DIAGNOSIS — R579 Shock, unspecified: Secondary | ICD-10-CM | POA: Diagnosis not present

## 2023-01-29 LAB — GLUCOSE, CAPILLARY
Glucose-Capillary: 156 mg/dL — ABNORMAL HIGH (ref 70–99)
Glucose-Capillary: 159 mg/dL — ABNORMAL HIGH (ref 70–99)
Glucose-Capillary: 244 mg/dL — ABNORMAL HIGH (ref 70–99)
Glucose-Capillary: 182 mg/dL — ABNORMAL HIGH (ref 70–99)
Glucose-Capillary: 127 mg/dL — ABNORMAL HIGH (ref 70–99)

## 2023-01-29 NOTE — Progress Notes (Signed)
Physical Therapy Treatment Patient Details Name: Deborah Obrien MRN: [id number] DOB: 1940/09/14 Today's Date: 01/29/2023   History of Present Illness Pt admitted for shock secondary to R hip fracture and is s/p R hip IM nailing. PT evaluation on POD4. History includes pacemaker, Afib, HTN, HLD, CAD and stage 4 kidney disease.    PT Comments    Patient received in recliner. She requests to use bathroom prior to ambulation. Transfers with supervision. Required assistance with self care. Patient is able to ambulated 60 feet with RW and supervision. No lob, slow, antalgic pace. Limited by pain and fatigue. Patient will continue to benefit from skilled PT to improve strength, endurance and safety with mobility. Decreased frequency to 4x/week.       Recommendations for follow up therapy are one component of a multi-disciplinary discharge planning process, led by the attending physician.  Recommendations may be updated based on patient status, additional functional criteria and insurance authorization.  Follow Up Recommendations  Can patient physically be transported by private vehicle: Yes    Assistance Recommended at Discharge Intermittent Supervision/Assistance  Patient can return home with the following A little help with walking and/or transfers;A little help with bathing/dressing/bathroom;Assist for transportation;Help with stairs or ramp for entrance   Equipment Recommendations  None recommended by PT    Recommendations for Other Services       Precautions / Restrictions Precautions Precautions: Fall Precaution Comments: airborne/contact precautions 2/2 Covid+ Restrictions Weight Bearing Restrictions: Yes RLE Weight Bearing: Weight bearing as tolerated     Mobility  Bed Mobility               General bed mobility comments: NT, patient in recliner at beginning of session    Transfers Overall transfer level: Needs assistance Equipment used: Rolling walker (2  wheels) Transfers: Sit to/from Stand Sit to Stand: Supervision           General transfer comment: from recliner    Ambulation/Gait Ambulation/Gait assistance: Supervision Gait Distance (Feet): 60 Feet Assistive device: Rolling walker (2 wheels) Gait Pattern/deviations: Step-to pattern, Decreased step length - right, Decreased step length - left, Decreased stride length Gait velocity: decr     General Gait Details: distance limited by fatigue. supervision with RW. Slightly antalgic gait pattern   Stairs             Wheelchair Mobility    Modified Rankin (Stroke Patients Only)       Balance Overall balance assessment: Needs assistance, History of Falls Sitting-balance support: Feet supported Sitting balance-Leahy Scale: Good     Standing balance support: Bilateral upper extremity supported, During functional activity, Reliant on assistive device for balance Standing balance-Leahy Scale: Fair                              Cognition Arousal/Alertness: Awake/alert Behavior During Therapy: WFL for tasks assessed/performed Overall Cognitive Status: Within Functional Limits for tasks assessed                                 General Comments: Pleasant and motivated. Talkative        Exercises      General Comments        Pertinent Vitals/Pain Pain Assessment Pain Assessment: Faces Faces Pain Scale: Hurts little more Pain Location: R hip Pain Descriptors / Indicators: Discomfort, Sore Pain Intervention(s): Patient requesting pain meds-RN notified, Repositioned  Home Living                          Prior Function            PT Goals (current goals can now be found in the care plan section) Acute Rehab PT Goals Patient Stated Goal: rehab then back to [facility] PT Goal Formulation: With patient Time For Goal Achievement: 02/08/23 Potential to Achieve Goals: Good Additional Goals Additional Goal #1: Pt  will be able to perform bed mobility/transfers with mod I and safe technique in order to improve functional independence Progress towards PT goals: Progressing toward goals    Frequency    Min 4X/week      PT Plan Frequency needs to be updated    Co-evaluation              AM-PAC PT "6 Clicks" Mobility   Outcome Measure  Help needed turning from your back to your side while in a flat bed without using bedrails?: A Little Help needed moving from lying on your back to sitting on the side of a flat bed without using bedrails?: A Little Help needed moving to and from a bed to a chair (including a wheelchair)?: A Little Help needed standing up from a chair using your arms (e.g., wheelchair or bedside chair)?: A Little Help needed to walk in hospital room?: A Little Help needed climbing 3-5 steps with a railing? : A Lot 6 Click Score: 17    End of Session Equipment Utilized During Treatment: Gait belt Activity Tolerance: Patient limited by fatigue;Patient limited by pain Patient left: in chair;with call bell/phone within reach;with chair alarm set Nurse Communication: Mobility status;Patient requests pain meds PT Visit Diagnosis: Other abnormalities of gait and mobility (R26.89);Muscle weakness (generalized) (M62.81);Difficulty in walking, not elsewhere classified (R26.2);Pain Pain - Right/Left: Right Pain - part of body: Hip     Time: 1914-7829 PT Time Calculation (min) (ACUTE ONLY): 23 min  Charges:  $Gait Training: 8-22 mins $Therapeutic Activity: 8-22 mins                     Gearl Kimbrough, PT, GCS 01/29/23,11:16 AM

## 2023-01-29 NOTE — Plan of Care (Signed)
  Problem: Education: Goal: Knowledge of General Education information will improve Description: Including pain rating scale, medication(s)/side effects and non-pharmacologic comfort measures Outcome: Progressing   Problem: Health Behavior/Discharge Planning: Goal: Ability to manage health-related needs will improve Outcome: Progressing   Problem: Clinical Measurements: Goal: Ability to maintain clinical measurements within normal limits will improve Outcome: Progressing Goal: Will remain free from infection Outcome: Progressing Goal: Diagnostic test results will improve Outcome: Progressing Goal: Respiratory complications will improve Outcome: Progressing Goal: Cardiovascular complication will be avoided Outcome: Progressing   Problem: Activity: Goal: Risk for activity intolerance will decrease Outcome: Progressing   Problem: Nutrition: Goal: Adequate nutrition will be maintained Outcome: Progressing   Problem: Coping: Goal: Level of anxiety will decrease Outcome: Progressing   Problem: Elimination: Goal: Will not experience complications related to bowel motility Outcome: Progressing Goal: Will not experience complications related to urinary retention Outcome: Progressing   Problem: Pain Managment: Goal: General experience of comfort will improve Outcome: Progressing   Problem: Safety: Goal: Ability to remain free from injury will improve Outcome: Progressing   Problem: Skin Integrity: Goal: Risk for impaired skin integrity will decrease Outcome: Progressing   Problem: Education: Goal: Ability to describe self-care measures that may prevent or decrease complications (Diabetes Survival Skills Education) will improve Outcome: Progressing Goal: Individualized Educational Video(s) Outcome: Progressing   Problem: Coping: Goal: Ability to adjust to condition or change in health will improve Outcome: Progressing   Problem: Fluid Volume: Goal: Ability to  maintain a balanced intake and output will improve Outcome: Progressing   Problem: Health Behavior/Discharge Planning: Goal: Ability to identify and utilize available resources and services will improve Outcome: Progressing Goal: Ability to manage health-related needs will improve Outcome: Progressing   Problem: Metabolic: Goal: Ability to maintain appropriate glucose levels will improve Outcome: Progressing   Problem: Nutritional: Goal: Maintenance of adequate nutrition will improve Outcome: Progressing Goal: Progress toward achieving an optimal weight will improve Outcome: Progressing   Problem: Skin Integrity: Goal: Risk for impaired skin integrity will decrease Outcome: Progressing   Problem: Tissue Perfusion: Goal: Adequacy of tissue perfusion will improve Outcome: Progressing   Problem: Education: Goal: Knowledge of risk factors and measures for prevention of condition will improve Outcome: Progressing   Problem: Coping: Goal: Psychosocial and spiritual needs will be supported Outcome: Progressing   Problem: Respiratory: Goal: Will maintain a patent airway Outcome: Progressing Goal: Complications related to the disease process, condition or treatment will be avoided or minimized Outcome: Progressing   

## 2023-01-29 NOTE — Progress Notes (Signed)
[facility]  - [place] at [facility]   PATIENT NAME: Deborah Obrien    MR#:  [id number]  DATE OF BIRTH:  1941-02-15  SUBJECTIVE:   Patient working with physical therapy overall doing well. Feels a lbetter.  Per RN surgical site ok  VITALS:  Blood pressure (!) 113/55, pulse 64, temperature 98.6 F (37 C), resp. rate 19, height 5\' 3"  (1.6 m), weight 77.5 kg, SpO2 99 %.  PHYSICAL EXAMINATION:   GENERAL:  82 y.o.-year-old patient with no acute distress.  LUNGS: Normal breath sounds bilaterally, no wheezing CARDIOVASCULAR: S1, S2 normal. No murmur   ABDOMEN: Soft, nontender, nondistended. Bowel sounds present.  EXTREMITIES: No  edema b/l.   Right hip dressing + NEUROLOGIC: nonfocal  patient is alert and awake. weak  LABORATORY PANEL:  CBC Recent Labs  Lab 01/24/23 0356 01/24/23 1219 01/25/23 0404  WBC 5.7  --   --   HGB 8.4*   < > 8.9*  HCT 26.3*   < > 28.3*  PLT 119*  --   --    < > = values in this interval not displayed.     Chemistries  Recent Labs  Lab 01/25/23 0404  NA 138  K 4.0  CL 108  CO2 24  GLUCOSE 238*  BUN 45*  CREATININE 1.39*  CALCIUM 8.5*  MG 1.9  AST 39  ALT 12  ALKPHOS 144*  BILITOT 1.1    Assessment and Plan  82 y.o. 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 Patient came to the emergency room after she had a fall at [facility]. She underwent intramedullary nailing of right femur with cephalomedullary device by Dr Allena Katz on 01/21/2023 patient was transferred to ICU for hypovolemia shock. She required IV vasopressors-- now weaned off.  Right intertrochanteric fracture status post mechanical fall status post intramedullary nailing -- patient followed  by orthopedic Dr. Signa Kell -- continue PT OT. TOC for discharge planning to rehab  Shock-- hypovolemic/hypotensive --was placed on IV vasopressors--weaned off --cont Midodrine--trial to wean midodrine --resolved  Acute hypercapnia/hypoxic respiratory failure -- PRN BiPAP -- resolved -- patient on room air  Acute kidney injury superimposed on CKD stage IV -- Lasix on hold -- improved -- seen by Dr. Wynelle Link -- replace electrolytes as indicated -- will resume Lasix 20 mg daily since creatinine trending down. Discussed with Dr. Wynelle Link  Anemia without signs of active bleeding -- hemoglobin stable  paroxysmal atrial fibrillation -- appreciate [facility] MG cardiology input-- [facility] signed off. Follow-up outpatient 3 to 4 weeks after discharge with Dr. Graciela Husbands -- continue amiodarone -- continue apixaban  COVID-19 infection -- asymptomatic -- supportive care  Procedures right hip surgery Family communication : none today Consults : orthopedic, nephrology, cardiology CODE STATUS: full DVT Prophylaxis : eliquis Level of care: Telemetry Medical Status is: Inpatient Remains inpatient appropriate because: PT OT and discharge planning  Patient is medically stable for discharge. She has bed offer at [facility]. Per TOC given COVID she will have to stay for 10 days before discharge.    TOTAL TIME TAKING CARE OF THIS PATIENT: 25 minutes.  >50% time spent on counselling and coordination of care  Note: This dictation was prepared with Dragon dictation along with smaller phrase technology. Any transcriptional errors that result from this process are unintentional.  Enedina Finner M.D    [facility]   CC: Primary care physician; Melonie Florida, FNP

## 2023-01-30 DIAGNOSIS — I4821 Permanent atrial fibrillation: Secondary | ICD-10-CM | POA: Diagnosis not present

## 2023-01-30 DIAGNOSIS — I25118 Atherosclerotic heart disease of native coronary artery with other forms of angina pectoris: Secondary | ICD-10-CM | POA: Diagnosis not present

## 2023-01-30 DIAGNOSIS — S72001A Fracture of unspecified part of neck of right femur, initial encounter for closed fracture: Secondary | ICD-10-CM | POA: Diagnosis not present

## 2023-01-30 DIAGNOSIS — K219 Gastro-esophageal reflux disease without esophagitis: Secondary | ICD-10-CM | POA: Diagnosis not present

## 2023-01-30 LAB — GLUCOSE, CAPILLARY
Glucose-Capillary: 151 mg/dL — ABNORMAL HIGH (ref 70–99)
Glucose-Capillary: 226 mg/dL — ABNORMAL HIGH (ref 70–99)
Glucose-Capillary: 190 mg/dL — ABNORMAL HIGH (ref 70–99)
Glucose-Capillary: 249 mg/dL — ABNORMAL HIGH (ref 70–99)
Glucose-Capillary: 178 mg/dL — ABNORMAL HIGH (ref 70–99)

## 2023-01-30 NOTE — Plan of Care (Signed)
  Problem: Education: Goal: Knowledge of General Education information will improve Description: Including pain rating scale, medication(s)/side effects and non-pharmacologic comfort measures Outcome: Progressing   Problem: Health Behavior/Discharge Planning: Goal: Ability to manage health-related needs will improve Outcome: Progressing   Problem: Clinical Measurements: Goal: Ability to maintain clinical measurements within normal limits will improve Outcome: Progressing Goal: Will remain free from infection Outcome: Progressing Goal: Diagnostic test results will improve Outcome: Progressing Goal: Respiratory complications will improve Outcome: Progressing Goal: Cardiovascular complication will be avoided Outcome: Progressing   Problem: Activity: Goal: Risk for activity intolerance will decrease Outcome: Progressing   Problem: Nutrition: Goal: Adequate nutrition will be maintained Outcome: Progressing   Problem: Coping: Goal: Level of anxiety will decrease Outcome: Progressing   Problem: Elimination: Goal: Will not experience complications related to bowel motility Outcome: Progressing Goal: Will not experience complications related to urinary retention Outcome: Progressing   Problem: Pain Managment: Goal: General experience of comfort will improve Outcome: Progressing   Problem: Safety: Goal: Ability to remain free from injury will improve Outcome: Progressing   Problem: Skin Integrity: Goal: Risk for impaired skin integrity will decrease Outcome: Progressing   Problem: Education: Goal: Ability to describe self-care measures that may prevent or decrease complications (Diabetes Survival Skills Education) will improve Outcome: Progressing Goal: Individualized Educational Video(s) Outcome: Progressing   Problem: Coping: Goal: Ability to adjust to condition or change in health will improve Outcome: Progressing   Problem: Fluid Volume: Goal: Ability to  maintain a balanced intake and output will improve Outcome: Progressing   Problem: Health Behavior/Discharge Planning: Goal: Ability to identify and utilize available resources and services will improve Outcome: Progressing Goal: Ability to manage health-related needs will improve Outcome: Progressing   Problem: Metabolic: Goal: Ability to maintain appropriate glucose levels will improve Outcome: Progressing   Problem: Nutritional: Goal: Maintenance of adequate nutrition will improve Outcome: Progressing Goal: Progress toward achieving an optimal weight will improve Outcome: Progressing   Problem: Skin Integrity: Goal: Risk for impaired skin integrity will decrease Outcome: Progressing   Problem: Tissue Perfusion: Goal: Adequacy of tissue perfusion will improve Outcome: Progressing   Problem: Education: Goal: Knowledge of risk factors and measures for prevention of condition will improve Outcome: Progressing   Problem: Coping: Goal: Psychosocial and spiritual needs will be supported Outcome: Progressing   Problem: Respiratory: Goal: Will maintain a patent airway Outcome: Progressing Goal: Complications related to the disease process, condition or treatment will be avoided or minimized Outcome: Progressing   

## 2023-01-30 NOTE — Progress Notes (Signed)
Occupational Therapy Treatment Patient Details Name: Deborah Obrien MRN: [id number] DOB: 1941/02/17 Today's Date: 01/30/2023   History of present illness Pt admitted for shock secondary to R hip fracture and is s/p R hip IM nailing. PT evaluation on POD4. History includes pacemaker, Afib, HTN, HLD, CAD and stage 4 kidney disease.   OT comments  Pt seen for OT tx this date. Pt pleasant, agreeable to session. Pt completed ADL transfers from recliner and from [facility] in room with Supv-SBA with RW. Pt ambulated with CGA to [facility] for toileting requiring MODA for clothing mgt and pericare in standing. Pt instructed in AE for LB dressing to improve independence and safety. Pt verbalized understanding. RN notified of pt's pain. Pt returned to recliner at end of session all needs in reach. Pt progressing well towards goals; continue with OT POC.    Recommendations for follow up therapy are one component of a multi-disciplinary discharge planning process, led by the attending physician.  Recommendations may be updated based on patient status, additional functional criteria and insurance authorization.    Assistance Recommended at Discharge Frequent or constant Supervision/Assistance  Patient can return home with the following  A little help with walking and/or transfers;A little help with bathing/dressing/bathroom;Assistance with cooking/housework;Direct supervision/assist for medications management;Direct supervision/assist for financial management;Assist for transportation   Equipment Recommendations  Other (comment) (defer to next venue)    Recommendations for Other Services      Precautions / Restrictions Precautions Precautions: Fall Precaution Comments: airborne/contact precautions 2/2 Covid+ Restrictions Weight Bearing Restrictions: Yes RLE Weight Bearing: Weight bearing as tolerated       Mobility Bed Mobility               General bed mobility comments: NT, patient in recliner at  beginning of session    Transfers Overall transfer level: Needs assistance Equipment used: Rolling walker (2 wheels) Transfers: Sit to/from Stand Sit to Stand: Supervision                 Balance Overall balance assessment: Needs assistance, History of Falls Sitting-balance support: Feet supported Sitting balance-Leahy Scale: Good     Standing balance support: Bilateral upper extremity supported, During functional activity, Reliant on assistive device for balance Standing balance-Leahy Scale: Fair                             ADL either performed or assessed with clinical judgement   ADL Overall ADL's : Needs assistance/impaired                         Toilet Transfer: Min guard;BSC/3in1;Rolling walker (2 wheels)   Toileting- Clothing Manipulation and Hygiene: Moderate assistance;Sit to/from stand Toileting - Clothing Manipulation Details (indicate cue type and reason): MOD A for clothing mgt and for pericare     Functional mobility during ADLs: Min guard;Rolling walker (2 wheels)      Extremity/Trunk Assessment              Vision       Perception     Praxis      Cognition Arousal/Alertness: Awake/alert Behavior During Therapy: WFL for tasks assessed/performed Overall Cognitive Status: Within Functional Limits for tasks assessed  Exercises Other Exercises Other Exercises: Educated in AE for LB dressing    Shoulder Instructions       General Comments      Pertinent Vitals/ Pain       Pain Assessment Pain Assessment: 0-10 Pain Score: 6  Pain Location: R hip Pain Descriptors / Indicators: Discomfort, Sore Pain Intervention(s): Limited activity within patient's tolerance, Monitored during session, Repositioned, Premedicated before session, Patient requesting pain meds-RN notified  Home Living                                          Prior  Functioning/Environment              Frequency  Min 1X/week        Progress Toward Goals  OT Goals(current goals can now be found in the care plan section)  Progress towards OT goals: Progressing toward goals  Acute Rehab OT Goals Patient Stated Goal: get better OT Goal Formulation: With patient Time For Goal Achievement: 02/08/23 Potential to Achieve Goals: Good  Plan Discharge plan remains appropriate;Frequency remains appropriate    Co-evaluation                 AM-PAC OT "6 Clicks" Daily Activity     Outcome Measure   Help from another person eating meals?: None Help from another person taking care of personal grooming?: A Little Help from another person toileting, which includes using toliet, bedpan, or urinal?: A Lot Help from another person bathing (including washing, rinsing, drying)?: A Lot Help from another person to put on and taking off regular upper body clothing?: A Little Help from another person to put on and taking off regular lower body clothing?: A Lot 6 Click Score: 16    End of Session Equipment Utilized During Treatment: Rolling walker (2 wheels)  OT Visit Diagnosis: History of falling (Z91.81)   Activity Tolerance Patient tolerated treatment well   Patient Left in chair;with call bell/phone within reach;with chair alarm set   Nurse Communication Patient requests pain meds        Time: 7846-9629 OT Time Calculation (min): 36 min  Charges: OT General Charges $OT Visit: 1 Visit OT Treatments $Self Care/Home Management : 23-37 mins  Arman Filter., MPH, MS, OTR/L ascom [phone] 01/30/23, 3:43 PM

## 2023-01-30 NOTE — Progress Notes (Signed)
[facility]  - [place] at [facility]   PATIENT NAME: Deborah Obrien    MR#:  [id number]  DATE OF BIRTH:  02/14/1941  SUBJECTIVE:   Patient working with physical therapy overall doing well. Sitting up in the chair VITALS:  Blood pressure (!) 137/54, pulse 62, temperature 98.2 F (36.8 C), resp. rate 18, height 5\' 3"  (1.6 m), weight 77.5 kg, SpO2 100 %.  PHYSICAL EXAMINATION:   GENERAL:  82 y.o.-year-old patient with no acute distress.  LUNGS: Normal breath sounds bilaterally, no wheezing CARDIOVASCULAR: S1, S2 normal. No murmur   ABDOMEN: Soft, nontender, nondistended. Bowel sounds present.  EXTREMITIES: No  edema b/l.   Right hip dressing + NEUROLOGIC: nonfocal  patient is alert and awake. weak  LABORATORY PANEL:  CBC Recent Labs  Lab 01/24/23 0356 01/24/23 1219 01/25/23 0404  WBC 5.7  --   --   HGB 8.4*   < > 8.9*  HCT 26.3*   < > 28.3*  PLT 119*  --   --    < > = values in this interval not displayed.     Chemistries  Recent Labs  Lab 01/25/23 0404  NA 138  K 4.0  CL 108  CO2 24  GLUCOSE 238*  BUN 45*  CREATININE 1.39*  CALCIUM 8.5*  MG 1.9  AST 39  ALT 12  ALKPHOS 144*  BILITOT 1.1    Assessment and Plan  82 y.o. 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 Patient came to the emergency room after she had a fall at [place] [facility]. She underwent intramedullary nailing of right femur with cephalomedullary device by Dr Allena Katz on 01/21/2023 patient was transferred to ICU for hypovolemia shock. She required IV vasopressors-- now weaned off.  Right intertrochanteric fracture status post mechanical fall status post intramedullary nailing -- patient followed by orthopedic Dr.  Signa Kell -- continue PT OT. TOC for discharge planning to rehab  Shock-- hypovolemic/hypotensive --was placed on IV vasopressors--weaned off --cont Midodrine--trial to wean midodrine --resolved  Acute hypercapnia/hypoxic respiratory failure -- PRN BiPAP -- resolved -- patient on room air  Acute kidney injury superimposed on CKD stage IV -- Lasix on hold -- improved -- seen by Dr. Wynelle Link -- replace electrolytes as indicated -- will resume Lasix 20 mg daily since creatinine trending down. Discussed with Dr. Wynelle Link  Anemia without signs of active bleeding -- hemoglobin stable  paroxysmal atrial fibrillation -- appreciate [facility] MG cardiology input-- [facility] signed off. Follow-up outpatient 3 to 4 weeks after discharge with Dr. Graciela Husbands -- continue amiodarone -- continue apixaban  COVID-19 infection -- asymptomatic -- supportive care  Procedures right hip surgery Family communication : none today Consults : orthopedic, nephrology, cardiology CODE STATUS: full DVT Prophylaxis : eliquis Level of care: Telemetry Medical Status is: Inpatient Remains inpatient appropriate because: PT OT and discharge planning  Patient is medically stable for discharge. She has bed offer at [facility]. Per TOC given COVID she will have to stay for 10 days before discharge.    TOTAL TIME TAKING CARE OF THIS PATIENT: 25 minutes.  >50% time spent on counselling and coordination of care  Note: This dictation was prepared with Dragon dictation along with smaller phrase technology. Any transcriptional errors that result from this process are unintentional.  Nani Skillern.D  [facility]   CC: Primary care physician; Melonie Florida, FNP

## 2023-01-30 NOTE — TOC Progression Note (Addendum)
Transition of Care [facility]) - Progression Note    Patient Details  Name: [facility] MRN: [id number] Date of Birth: [birth year]  Transition of Care [facility] CM/SW Contact  Marlowe Sax, RN Phone Number: 01/30/2023, 2:08 PM  Clinical Narrative:   reached out to [facility] to see if they can take her on 5/22, Ins Berkley Harvey needed,They will be able to accept on 05/22     Expected Discharge Plan: Skilled Nursing Facility Barriers to Discharge: Continued Medical Work up  Expected Discharge Plan and Services       Living arrangements for the past 2 months: Assisted Living Facility ([place] house)                                       Social Determinants of Health (SDOH) Interventions SDOH Screenings   Food Insecurity: No Food Insecurity (01/20/2023)  Housing: Low Risk  (01/20/2023)  Transportation Needs: No Transportation Needs (01/20/2023)  Utilities: Not At Risk (01/20/2023)  Tobacco Use: Low Risk  (01/23/2023)    Readmission Risk Interventions     No data to display

## 2023-01-31 DIAGNOSIS — I4821 Permanent atrial fibrillation: Secondary | ICD-10-CM | POA: Diagnosis not present

## 2023-01-31 DIAGNOSIS — E782 Mixed hyperlipidemia: Secondary | ICD-10-CM | POA: Diagnosis not present

## 2023-01-31 DIAGNOSIS — S72001A Fracture of unspecified part of neck of right femur, initial encounter for closed fracture: Secondary | ICD-10-CM | POA: Diagnosis not present

## 2023-01-31 DIAGNOSIS — E039 Hypothyroidism, unspecified: Secondary | ICD-10-CM | POA: Diagnosis not present

## 2023-01-31 LAB — CBC
HCT: 28.5 % — ABNORMAL LOW (ref 36.0–46.0)
Hemoglobin: 9.1 g/dL — ABNORMAL LOW (ref 12.0–15.0)
MCH: 32.5 pg (ref 26.0–34.0)
MCHC: 31.9 g/dL (ref 30.0–36.0)
MCV: 101.8 fL — ABNORMAL HIGH (ref 80.0–100.0)
Platelets: 218 10*3/uL (ref 150–400)
RBC: 2.8 MIL/uL — ABNORMAL LOW (ref 3.87–5.11)
RDW: 15.8 % — ABNORMAL HIGH (ref 11.5–15.5)
WBC: 5.3 10*3/uL (ref 4.0–10.5)
nRBC: 0 % (ref 0.0–0.2)

## 2023-01-31 LAB — GLUCOSE, CAPILLARY
Glucose-Capillary: 123 mg/dL — ABNORMAL HIGH (ref 70–99)
Glucose-Capillary: 134 mg/dL — ABNORMAL HIGH (ref 70–99)
Glucose-Capillary: 187 mg/dL — ABNORMAL HIGH (ref 70–99)
Glucose-Capillary: 292 mg/dL — ABNORMAL HIGH (ref 70–99)
Glucose-Capillary: 57 mg/dL — ABNORMAL LOW (ref 70–99)
Glucose-Capillary: 80 mg/dL (ref 70–99)

## 2023-01-31 NOTE — Inpatient Diabetes Management (Signed)
Inpatient Diabetes Program Recommendations  AACE/ADA: New Consensus Statement on Inpatient Glycemic Control (2015)  Target Ranges:  Prepandial:   less than 140 mg/dL      Peak postprandial:   less than 180 mg/dL (1-2 hours)      Critically ill patients:  140 - 180 mg/dL   Lab Results  Component Value Date   GLUCAP 57 (L) 01/31/2023   HGBA1C 6.8 (H) 01/21/2023    Review of Glycemic Control  Latest Reference Range & Units 01/31/23 00:01 01/31/23 04:56 01/31/23 09:15  Glucose-Capillary 70 - 99 mg/dL 80 161 (H) 57 (L)   Diabetes history: DM  Outpatient Diabetes medications:  Lantus 16 units q HS, Humalog 0-12 units tid with meals Current orders for Inpatient glycemic control:  Novolog resistant q 4 hours Semglee 9 units q HS Inpatient Diabetes Program Recommendations:    Please consider reducing Novolog to moderate tid with meals and HS.   Thanks,  Beryl Meager, RN, BC-ADM Inpatient Diabetes Coordinator Pager [phone]  (8a-5p)

## 2023-01-31 NOTE — Progress Notes (Signed)
Physical Therapy Treatment Patient Details Name: Deborah Obrien MRN: [id number] DOB: 02-Feb-1941 Today's Date: 01/31/2023   History of Present Illness Deborah Obrien is an 81yoF admitted for shock secondary to R hip fracture and is s/p R hip IM nailing. PT evaluation on POD4. History includes pacemaker, Afib, HTN, HLD, CAD and stage 4 kidney disease. Pt (+) COVID 19.    PT Comments    Pt sitting up in recliner on entry, Rt hip sore but tolerable. Pt assisted with supine exercises as per handout. Neutral hip flexion is quite painful, poorly tolerated, as are sagittal heel slides. Pt does much better with axial femur loading. Pt partakes in side stepping along window, needs help to avoid bumping forehead on window framing. Pt continue to progress, remains very motivated despite discomfort.     Recommendations for follow up therapy are one component of a multi-disciplinary discharge planning process, led by the attending physician.  Recommendations may be updated based on patient status, additional functional criteria and insurance authorization.  Follow Up Recommendations  Can patient physically be transported by private vehicle: Yes    Assistance Recommended at Discharge Intermittent Supervision/Assistance  Patient can return home with the following A little help with walking and/or transfers;A little help with bathing/dressing/bathroom;Assist for transportation;Help with stairs or ramp for entrance   Equipment Recommendations  None recommended by PT    Recommendations for Other Services       Precautions / Restrictions       Mobility  Bed Mobility               General bed mobility comments: NT, patient in recliner at beginning of session    Transfers Overall transfer level: Needs assistance Equipment used: Rolling walker (2 wheels) Transfers: Sit to/from Stand Sit to Stand: Supervision                Ambulation/Gait Ambulation/Gait assistance:  Supervision Gait Distance (Feet): 45 Feet Assistive device: Rolling walker (2 wheels)             Stairs             Wheelchair Mobility    Modified Rankin (Stroke Patients Only)       Balance                                            Cognition Arousal/Alertness: Awake/alert Behavior During Therapy: WFL for tasks assessed/performed Overall Cognitive Status: Within Functional Limits for tasks assessed                                          Exercises General Exercises - Lower Extremity Short Arc Quad: AROM, Right, 10 reps, Supine Heel Slides: AAROM, Right, 10 reps, Supine Hip ABduction/ADduction: AAROM, Right, 10 reps, Supine Other Exercises Other Exercises: lateral side stepping along windowsil 1x27ft bilat, fwd AMB to start x10, back to chair x10 Other Exercises: STS from elevated EOB x5    General Comments        Pertinent Vitals/Pain Pain Assessment Pain Assessment: Faces Faces Pain Scale: Hurts even more Pain Location: R hip Pain Descriptors / Indicators: Discomfort, Sore    Home Living  Prior Function            PT Goals (current goals can now be found in the care plan section) Acute Rehab PT Goals Patient Stated Goal: rehab then back to [place] house PT Goal Formulation: With patient Time For Goal Achievement: 02/08/23 Potential to Achieve Goals: Good Additional Goals Additional Goal #1: Pt will be able to perform bed mobility/transfers with mod I and safe technique in order to improve functional independence Progress towards PT goals: Progressing toward goals    Frequency    Min 4X/week      PT Plan Current plan remains appropriate    Co-evaluation              AM-PAC PT "6 Clicks" Mobility   Outcome Measure  Help needed turning from your back to your side while in a flat bed without using bedrails?: A Little Help needed moving from lying on  your back to sitting on the side of a flat bed without using bedrails?: A Little Help needed moving to and from a bed to a chair (including a wheelchair)?: A Little Help needed standing up from a chair using your arms (e.g., wheelchair or bedside chair)?: A Little Help needed to walk in hospital room?: A Little Help needed climbing 3-5 steps with a railing? : A Lot 6 Click Score: 17    End of Session Equipment Utilized During Treatment: Gait belt Activity Tolerance: Patient limited by fatigue;Patient limited by pain;Patient tolerated treatment well Patient left: in chair;with call bell/phone within reach;with chair alarm set Nurse Communication: Mobility status;Patient requests pain meds PT Visit Diagnosis: Other abnormalities of gait and mobility (R26.89);Muscle weakness (generalized) (M62.81);Difficulty in walking, not elsewhere classified (R26.2);Pain Pain - Right/Left: Right Pain - part of body: Hip     Time: 1610-9604 PT Time Calculation (min) (ACUTE ONLY): 30 min  Charges:  $Gait Training: 8-22 mins $Therapeutic Exercise: 8-22 mins                    12:53 PM, 01/31/23 Rosamaria Lints, PT, DPT Physical Therapist - [facility]  [phone] (ASCOM)    [facility] 01/31/2023, 12:51 PM

## 2023-01-31 NOTE — Progress Notes (Signed)
[facility]  - [facility]   PATIENT NAME: Deborah Obrien    MR#:  [id number]  DATE OF BIRTH:  [birth year]  SUBJECTIVE:   Patient working with physical therapy overall doing well. Sitting up in the chair VITALS:  Blood pressure (!) 117/45, pulse 60, temperature 97.7 F (36.5 C), resp. rate 17, height 5\' 3"  (1.6 m), weight 77.3 kg, SpO2 100 %.  PHYSICAL EXAMINATION:   GENERAL:  82 y.o.-year-old patient with no acute distress.  LUNGS: Normal breath sounds bilaterally, no wheezing CARDIOVASCULAR: S1, S2 normal. No murmur   ABDOMEN: Soft, nontender, nondistended. Bowel sounds present.  EXTREMITIES: No  edema b/l.   Right hip dressing + NEUROLOGIC: nonfocal  patient is alert and awake. weak  LABORATORY PANEL:  CBC Recent Labs  Lab 01/31/23 0552  WBC 5.3  HGB 9.1*  HCT 28.5*  PLT 218     Chemistries  Recent Labs  Lab 01/25/23 0404  NA 138  K 4.0  CL 108  CO2 24  GLUCOSE 238*  BUN 45*  CREATININE 1.39*  CALCIUM 8.5*  MG 1.9  AST 39  ALT 12  ALKPHOS 144*  BILITOT 1.1    Assessment and Plan  82 y.o. 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 Patient came to the emergency room after she had a fall [facility]. She underwent intramedullary nailing of right femur with cephalomedullary device by Dr Allena Katz on 01/21/2023 patient was transferred to ICU for hypovolemia shock. She required IV vasopressors-- now weaned off.  Right intertrochanteric fracture status post mechanical fall status post intramedullary nailing -- patient followed by orthopedic Dr. Signa Kell -- continue PT OT. TOC for discharge planning to rehab  Shock-- hypovolemic/hypotensive --was placed on IV  vasopressors--weaned off --cont Midodrine--trial to wean midodrine --resolved  Acute hypercapnia/hypoxic respiratory failure -- PRN BiPAP -- resolved -- patient on room air  Acute kidney injury superimposed on CKD stage IV -- Lasix on hold -- improved -- seen by Dr. Wynelle Link -- replace electrolytes as indicated -- will resume Lasix 20 mg daily since creatinine trending down. Discussed with Dr. Wynelle Link  Anemia without signs of active bleeding -- hemoglobin stable  paroxysmal atrial fibrillation -- appreciate [facility] cardiology input-- [facility] signed off. Follow-up outpatient 3 to 4 weeks after discharge with Dr. Graciela Husbands -- continue amiodarone -- continue apixaban  COVID-19 infection -- asymptomatic -- supportive care  Procedures right hip surgery Family communication : none today Consults : orthopedic, nephrology, cardiology CODE STATUS: full DVT Prophylaxis : eliquis Level of care: Telemetry Medical Status is: Inpatient Remains inpatient appropriate because: PT OT and discharge planning  Patient is medically stable for discharge. She has bed offer at [facility]. Per TOC given COVID she will have to stay for 10 days before discharge.   Patient will discharge 02/01/23 to rehab  TOTAL TIME TAKING CARE OF THIS PATIENT: 25 minutes.  >50% time spent on counselling and coordination of care  Note: This dictation was prepared with Dragon dictation along with smaller phrase technology. Any transcriptional errors that result from this process are unintentional.  Enedina Finner M.D    [facility]   CC: Primary care physician; Melonie Florida, FNP

## 2023-01-31 NOTE — Progress Notes (Signed)
Surgical dsg changed to honeycomb

## 2023-01-31 NOTE — Plan of Care (Signed)
  Problem: Education: Goal: Knowledge of General Education information will improve Description: Including pain rating scale, medication(s)/side effects and non-pharmacologic comfort measures Outcome: Progressing   Problem: Health Behavior/Discharge Planning: Goal: Ability to manage health-related needs will improve Outcome: Progressing   Problem: Clinical Measurements: Goal: Ability to maintain clinical measurements within normal limits will improve Outcome: Progressing Goal: Will remain free from infection Outcome: Progressing Goal: Diagnostic test results will improve Outcome: Progressing Goal: Respiratory complications will improve Outcome: Progressing Goal: Cardiovascular complication will be avoided Outcome: Progressing   Problem: Activity: Goal: Risk for activity intolerance will decrease Outcome: Progressing   Problem: Nutrition: Goal: Adequate nutrition will be maintained Outcome: Progressing   Problem: Coping: Goal: Level of anxiety will decrease Outcome: Progressing   Problem: Elimination: Goal: Will not experience complications related to bowel motility Outcome: Progressing Goal: Will not experience complications related to urinary retention Outcome: Progressing   Problem: Pain Managment: Goal: General experience of comfort will improve Outcome: Progressing   Problem: Safety: Goal: Ability to remain free from injury will improve Outcome: Progressing   Problem: Skin Integrity: Goal: Risk for impaired skin integrity will decrease Outcome: Progressing   Problem: Education: Goal: Ability to describe self-care measures that may prevent or decrease complications (Diabetes Survival Skills Education) will improve Outcome: Progressing Goal: Individualized Educational Video(s) Outcome: Progressing   Problem: Coping: Goal: Ability to adjust to condition or change in health will improve Outcome: Progressing   Problem: Fluid Volume: Goal: Ability to  maintain a balanced intake and output will improve Outcome: Progressing   Problem: Health Behavior/Discharge Planning: Goal: Ability to identify and utilize available resources and services will improve Outcome: Progressing Goal: Ability to manage health-related needs will improve Outcome: Progressing   Problem: Metabolic: Goal: Ability to maintain appropriate glucose levels will improve Outcome: Progressing   Problem: Nutritional: Goal: Maintenance of adequate nutrition will improve Outcome: Progressing Goal: Progress toward achieving an optimal weight will improve Outcome: Progressing   Problem: Skin Integrity: Goal: Risk for impaired skin integrity will decrease Outcome: Progressing   Problem: Tissue Perfusion: Goal: Adequacy of tissue perfusion will improve Outcome: Progressing   Problem: Education: Goal: Knowledge of risk factors and measures for prevention of condition will improve Outcome: Progressing   Problem: Coping: Goal: Psychosocial and spiritual needs will be supported Outcome: Progressing   Problem: Respiratory: Goal: Will maintain a patent airway Outcome: Progressing Goal: Complications related to the disease process, condition or treatment will be avoided or minimized Outcome: Progressing   

## 2023-02-01 DIAGNOSIS — M25551 Pain in right hip: Secondary | ICD-10-CM | POA: Diagnosis not present

## 2023-02-01 DIAGNOSIS — Z950 Presence of cardiac pacemaker: Secondary | ICD-10-CM | POA: Diagnosis not present

## 2023-02-01 DIAGNOSIS — K219 Gastro-esophageal reflux disease without esophagitis: Secondary | ICD-10-CM | POA: Diagnosis not present

## 2023-02-01 DIAGNOSIS — I4892 Unspecified atrial flutter: Secondary | ICD-10-CM | POA: Diagnosis not present

## 2023-02-01 DIAGNOSIS — I959 Hypotension, unspecified: Secondary | ICD-10-CM | POA: Diagnosis not present

## 2023-02-01 DIAGNOSIS — I2510 Atherosclerotic heart disease of native coronary artery without angina pectoris: Secondary | ICD-10-CM | POA: Diagnosis not present

## 2023-02-01 DIAGNOSIS — F418 Other specified anxiety disorders: Secondary | ICD-10-CM | POA: Diagnosis not present

## 2023-02-01 DIAGNOSIS — N184 Chronic kidney disease, stage 4 (severe): Secondary | ICD-10-CM | POA: Diagnosis not present

## 2023-02-01 DIAGNOSIS — I130 Hypertensive heart and chronic kidney disease with heart failure and stage 1 through stage 4 chronic kidney disease, or unspecified chronic kidney disease: Secondary | ICD-10-CM | POA: Diagnosis not present

## 2023-02-01 DIAGNOSIS — W19XXXD Unspecified fall, subsequent encounter: Secondary | ICD-10-CM | POA: Diagnosis not present

## 2023-02-01 DIAGNOSIS — R579 Shock, unspecified: Secondary | ICD-10-CM | POA: Diagnosis not present

## 2023-02-01 DIAGNOSIS — J9601 Acute respiratory failure with hypoxia: Secondary | ICD-10-CM | POA: Insufficient documentation

## 2023-02-01 DIAGNOSIS — I10 Essential (primary) hypertension: Secondary | ICD-10-CM | POA: Diagnosis not present

## 2023-02-01 DIAGNOSIS — E1122 Type 2 diabetes mellitus with diabetic chronic kidney disease: Secondary | ICD-10-CM | POA: Diagnosis not present

## 2023-02-01 DIAGNOSIS — S72001S Fracture of unspecified part of neck of right femur, sequela: Secondary | ICD-10-CM | POA: Diagnosis not present

## 2023-02-01 DIAGNOSIS — S72001A Fracture of unspecified part of neck of right femur, initial encounter for closed fracture: Secondary | ICD-10-CM | POA: Diagnosis not present

## 2023-02-01 DIAGNOSIS — S72141D Displaced intertrochanteric fracture of right femur, subsequent encounter for closed fracture with routine healing: Secondary | ICD-10-CM | POA: Diagnosis not present

## 2023-02-01 DIAGNOSIS — I4821 Permanent atrial fibrillation: Secondary | ICD-10-CM | POA: Diagnosis not present

## 2023-02-01 DIAGNOSIS — E1140 Type 2 diabetes mellitus with diabetic neuropathy, unspecified: Secondary | ICD-10-CM | POA: Diagnosis not present

## 2023-02-01 DIAGNOSIS — Z794 Long term (current) use of insulin: Secondary | ICD-10-CM

## 2023-02-01 DIAGNOSIS — J9602 Acute respiratory failure with hypercapnia: Secondary | ICD-10-CM

## 2023-02-01 DIAGNOSIS — E1121 Type 2 diabetes mellitus with diabetic nephropathy: Secondary | ICD-10-CM

## 2023-02-01 DIAGNOSIS — E039 Hypothyroidism, unspecified: Secondary | ICD-10-CM | POA: Diagnosis not present

## 2023-02-01 DIAGNOSIS — I480 Paroxysmal atrial fibrillation: Secondary | ICD-10-CM | POA: Diagnosis not present

## 2023-02-01 DIAGNOSIS — Z7901 Long term (current) use of anticoagulants: Secondary | ICD-10-CM | POA: Diagnosis not present

## 2023-02-01 DIAGNOSIS — I5032 Chronic diastolic (congestive) heart failure: Secondary | ICD-10-CM | POA: Diagnosis not present

## 2023-02-01 DIAGNOSIS — F01B18 Vascular dementia, moderate, with other behavioral disturbance: Secondary | ICD-10-CM | POA: Diagnosis not present

## 2023-02-01 DIAGNOSIS — E119 Type 2 diabetes mellitus without complications: Secondary | ICD-10-CM | POA: Diagnosis not present

## 2023-02-01 DIAGNOSIS — Z7401 Bed confinement status: Secondary | ICD-10-CM | POA: Diagnosis not present

## 2023-02-01 DIAGNOSIS — F03B0 Unspecified dementia, moderate, without behavioral disturbance, psychotic disturbance, mood disturbance, and anxiety: Secondary | ICD-10-CM | POA: Diagnosis present

## 2023-02-01 DIAGNOSIS — M109 Gout, unspecified: Secondary | ICD-10-CM | POA: Diagnosis not present

## 2023-02-01 LAB — GLUCOSE, CAPILLARY
Glucose-Capillary: 105 mg/dL — ABNORMAL HIGH (ref 70–99)
Glucose-Capillary: 129 mg/dL — ABNORMAL HIGH (ref 70–99)
Glucose-Capillary: 72 mg/dL (ref 70–99)

## 2023-02-01 MED ORDER — METHOCARBAMOL 500 MG PO TABS: 500.0000 mg | ORAL_TABLET | Freq: Four times a day (QID) | ORAL | 0 refills | Status: DC | PRN

## 2023-02-01 MED ORDER — FOLIC ACID 1 MG PO TABS: 1.0000 mg | ORAL_TABLET | Freq: Every day | ORAL | 0 refills | Status: DC

## 2023-02-01 MED ORDER — HUMALOG 100 UNIT/ML IJ SOLN: 6.0000 [IU] | Freq: Three times a day (TID) | INTRAMUSCULAR | 11 refills | Status: DC

## 2023-02-01 MED ORDER — LANTUS 100 UNIT/ML ~~LOC~~ SOLN: 9.0000 [IU] | Freq: Every day | SUBCUTANEOUS | 11 refills | Status: DC

## 2023-02-01 MED ORDER — FUROSEMIDE 20 MG PO TABS: 20.0000 mg | ORAL_TABLET | Freq: Every day | ORAL | 0 refills | Status: AC

## 2023-02-01 MED ORDER — VITAMIN B-1 100 MG PO TABS: 100.0000 mg | ORAL_TABLET | Freq: Every day | ORAL | 0 refills | Status: DC

## 2023-02-01 MED ORDER — HUMALOG 100 UNIT/ML IJ SOLN: 4.0000 [IU] | Freq: Three times a day (TID) | INTRAMUSCULAR | 11 refills | Status: DC

## 2023-02-01 MED ORDER — ADULT MULTIVITAMIN W/MINERALS CH: 1.0000 | ORAL_TABLET | Freq: Every day | ORAL | 0 refills | Status: AC

## 2023-02-01 MED ORDER — POLYETHYLENE GLYCOL 3350 17 G PO PACK: 17.0000 g | PACK | Freq: Every day | ORAL | 0 refills | Status: DC

## 2023-02-01 MED ORDER — ACETAMINOPHEN 325 MG PO TABS: 650.0000 mg | ORAL_TABLET | Freq: Four times a day (QID) | ORAL | Status: DC | PRN

## 2023-02-01 MED ORDER — ENSURE ENLIVE PO LIQD: 237.0000 mL | Freq: Two times a day (BID) | ORAL | 0 refills | Status: DC

## 2023-02-01 NOTE — Care Management Important Message (Signed)
Important Message  Patient Details  Name: Deborah Obrien MRN: [id number] Date of Birth: 29-Jul-1941   Medicare Important Message Given:  Yes   I reviewed the Important Message from Medicare with her LAM, STILES by phone [phone]) and he is in agreement with her discharge. I wished her a speedy recovery and thanked him for his time.  Olegario Messier A Brande Uncapher 02/01/2023, 10:27 AM

## 2023-02-01 NOTE — Discharge Summary (Signed)
Physician Discharge Summary   Patient: Deborah Obrien MRN: [id number] DOB: 17-May-1941  Admit date:     01/20/2023  Discharge date: 02/01/23  Discharge Physician: Alford Highland   PCP: Melonie Florida, FNP   Recommendations at discharge:    Follow up team at rehab 1 day Follow up with Orthopedics around 4 days  Discharge Diagnoses: Principal Problem:   Closed fracture of right hip (HCC) Active Problems:   Permanent atrial fibrillation (HCC)   Shock (HCC)   Gastro-esophageal reflux disease without esophagitis   CAD (coronary artery disease)   Hypothyroidism   Nausea   Acute renal failure superimposed on stage 4 chronic kidney disease (HCC)   Gout   Hyperlipemia, mixed   Type 2 diabetes mellitus with renal manifestations (HCC)   UTI (urinary tract infection)   Chronic heart failure with preserved ejection fraction (HCC)   Transaminitis   Hypotension after procedure   Pulmonary hypertension (HCC)   Metabolic acidosis   Fall   Paroxysmal atrial fibrillation (HCC)   Acute respiratory failure with hypoxia and hypercapnia [facility])    Hospital Course: 82 y.o. female with medical history significant of atrial fibrillation on Eliquis, GERD, anxiety, anemia, stage IV CKD, diabetes mellitus, hypertension, hypothyroidism, obesity, restless leg syndrome presenting with fall and right hip fracture.  Patient resident of local skilled nursing facility.  Husband is currently at another skilled nursing facility in the setting of vascular dementia per patient.  Patient reports attempting to go to a medical visit with her husband.  Patient uses walker at baseline.  Patient left the building then turned back in to get her phone when she tripped over her walker landing on her right hip.  No reported head trauma loss conscious.  Patient denies any weakness or dizziness prior to fall.  No chest pain or shortness of breath.  No nausea or vomiting.  Has had significant pain after the fall.  Noted  mission back in September 2023 with humerus fracture status post operative repair.  On Coumadin in the setting of Eliquis. Presented to the ER afebrile, hemodynamically stable.  White count 5.6, hemoglobin 13, creatinine 1.8, glucose 173, alk phos 151, T. bili 1.7.  Hip plain films with comminuted intertrochanteric right hip fracture with impaction and varus angulation at the fracture site.  Chest x-ray within normal limits.   5/11.  Patient seen in the morning complained of nausea and given Zofran.  Patient went to the operating room and became hypotensive requiring pressors.  ICU team consulted and will take over care.  She underwent intramedullary nailing of right femur with cephalomedullary device by Dr Allena Katz on 01/21/2023 patient was transferred to ICU for hypovolemia shock. She required IV vasopressors-- now weaned off.  Diagnosed with covid on 5/12 and covid isolation ended on 5/22 and can go out to rehab.    Assessment and Plan: * Closed fracture of right hip (HCC) Intramedullary nailing done by Dr Allena Katz on 5/11.  Follow up with orthpedics in around 4 days.  As needed pain meds. Out to rehab today  Shock [facility]) Postoperative.  Resolved.  Patient on chronic midodrine.  Permanent atrial fibrillation (HCC) On amiodarone and eliquis   Gastro-esophageal reflux disease without esophagitis On famotidine  CAD (coronary artery disease) stable  Nausea resolved  Hypothyroidism On synthroid   Acute renal failure superimposed on stage 4 chronic kidney disease (HCC) Lasr cr 1.39. Peaked at 2.68 on 5/12.  Acute respiratory failure with hypoxia and hypercapnia (HCC) Resolved.  Required bipap initially  after surgery  Transaminitis Likely from low blood pressure  Chronic heart failure with preserved ejection fraction (HCC) No signs of heart failure this morning. On low dose lasix   UTI (urinary tract infection) Patient reports recent diagnosis of UTI Prescribed Cipro from May 7  through May 14   Type 2 diabetes mellitus with renal manifestations (HCC) Patient on lantus insulin 9 units at night with short acting insulin prior to meals  Hyperlipemia, mixed On pravachol    Gout On allopurinol           Consultants: orthopedic surgery, intensivist Procedures performed: right hip intramedullary nailing Disposition: Rehabilitation facility Diet recommendation:  Cardiac and Carb modified diet DISCHARGE MEDICATION: Allergies as of 02/01/2023       Reactions   Cephalosporins Other (See Comments), Diarrhea, Nausea Only   Reaction:  Unknown  Other reaction(s): Other (see comments) Other reaction(s): Other (See Comments) Other Reaction: Intolerance Reaction:  Stomach pain  Other Reaction: Intolerance Reaction:  Unknown    Fish Allergy Shortness Of Breath   All kinds of fish. Severe vomitting even with smell of fish.    Macrolides And Ketolides Other (See Comments)   Severe stomach pain (mycins)   Meperidine Shortness Of Breath, Nausea Only, Other (See Comments)   Stomach pain    Other Nausea Only, Swelling, Rash, Anaphylaxis, Diarrhea, Shortness Of Breath, Other (See Comments)   Allergen: NON-STEROIDS, bextra - hands and feet swell Reaction:  Stomach pain   Other Reaction: Intolerance   Prednisone Anaphylaxis, Other (See Comments)   (facial swelling/redness/burning)Increases pts blood sugar  Pt states that she is allergic to all steroids.     Shellfish Allergy Anaphylaxis, Shortness Of Breath, Diarrhea, Nausea Only, Rash   Stomach pain    Sulfa Antibiotics Shortness Of Breath, Diarrhea, Nausea Only, Other (See Comments)   Reaction:  Stomach pain  Reaction:  Stomach pain  Other Reaction: Intolerance   Sulfacetamide Sodium Diarrhea, Nausea Only, Other (See Comments), Shortness Of Breath   Reaction:  Stomach pain    Telbivudine Other (See Comments)   Unknown reaction   Uloric [febuxostat] Anaphylaxis   Locks pt's body up   Aspirin Rash, Other  (See Comments)   Stomach pain (tolerates lower doses)   Celecoxib Other (See Comments)   GI bleed, weakness, and stomach pain.     Cephalexin Diarrhea, Nausea Only, Other (See Comments)   stomach pain    Codeine Diarrhea, Nausea Only, Other (See Comments)   Reaction:  Stomach pain Pt tolerates morphine    Dilaudid [hydromorphone Hcl] Other (See Comments)   Reactions: easy to overdose - blood pressure drops really low   Erythromycin Diarrhea, Nausea Only, Other (See Comments)   Reaction:  Stomach pain    Iodinated Contrast Media Rash, Other (See Comments)   Pt states that she is unable to have because she has chronic kidney disease.   CKD Pt states that she is unable to have because she has chronic kidney disease.   CKD    Lipitor [atorvastatin Calcium] Nausea Only   Reaction: nausea, weakness, pass blood   Oxycodone Other (See Comments)   Reaction:  Stomach pain    Aleve [naproxen]    Reaction: severe stomach pain   Atorvastatin Nausea Only, Other (See Comments)   Weakness    Doxycycline    Stomach pain    Iodine Rash   Motrin [ibuprofen]    Reaction: severe stomach pain   Tape Other (See Comments)   Causes pts skin to tear  Pt states that she is able to use paper tape.     Valdecoxib Swelling, Other (See Comments)   Pt states that her hands and feet swell.          Medication List     STOP taking these medications    ciprofloxacin 500 MG tablet Commonly known as: CIPRO   MULTIVITAMIN/MINERALS PO   oxybutynin 5 MG tablet Commonly known as: DITROPAN   pravastatin 20 MG tablet Commonly known as: PRAVACHOL   senna 8.6 MG Tabs tablet Commonly known as: SENOKOT   Simethicone Liqd   talc powder       TAKE these medications    acetaminophen 325 MG tablet Commonly known as: TYLENOL Take 2 tablets (650 mg total) by mouth every 6 (six) hours as needed for mild pain, moderate pain or fever (or Fever >/= 101). What changed:  how much to take Another  medication with the same name was removed. Continue taking this medication, and follow the directions you see here.   albuterol 108 (90 Base) MCG/ACT inhaler Commonly known as: VENTOLIN HFA Inhale 2 puffs into the lungs every 6 (six) hours as needed for wheezing or shortness of breath. Ventolin   allopurinol 100 MG tablet Commonly known as: ZYLOPRIM Take 100 mg by mouth daily.   alum & mag hydroxide-simeth 200-200-20 MG/5ML suspension Commonly known as: MAALOX/MYLANTA Take 10 mLs by mouth 3 (three) times daily as needed for indigestion or heartburn.   amiodarone 200 MG tablet Commonly known as: PACERONE TAKE 1 TABLET BY MOUTH ONCE DAILY . APPOINTMENT REQUIRED FOR FUTURE REFILLS   apixaban 2.5 MG Tabs tablet Commonly known as: Eliquis Take 1 tablet (2.5 mg total) by mouth 2 (two) times daily.   cyanocobalamin 1000 MCG tablet Commonly known as: VITAMIN B12 Take 1,000 mcg by mouth daily.   docusate sodium 100 MG capsule Commonly known as: COLACE Take 100 mg by mouth 2 (two) times daily.   famotidine 10 MG tablet Commonly known as: PEPCID Take 10 mg by mouth daily.   feeding supplement Liqd Take 237 mLs by mouth 2 (two) times daily between meals.   FLONASE SENSIMIST NA Place 1 spray into the nose daily.   folic acid 1 MG tablet Commonly known as: FOLVITE Take 1 tablet (1 mg total) by mouth daily. Start taking on: Feb 02, 2023   furosemide 20 MG tablet Commonly known as: LASIX Take 1 tablet (20 mg total) by mouth daily. Start taking on: Feb 02, 2023 What changed:  medication strength how much to take   gabapentin 100 MG capsule Commonly known as: NEURONTIN Take 100 mg by mouth 2 (two) times daily. 05/07 - 05/11   HumaLOG 100 UNIT/ML injection Generic drug: insulin lispro Inject 0.04 mLs (4 Units total) into the skin 3 (three) times daily with meals. Sliding scale What changed: how much to take   Lantus 100 UNIT/ML injection Generic drug: insulin  glargine Inject 0.09 mLs (9 Units total) into the skin at bedtime. What changed:  how much to take how to take this   Levothyroxine Sodium 37.5 MCG Caps Take 37.5 mcg by mouth daily before breakfast.   methocarbamol 500 MG tablet Commonly known as: ROBAXIN Take 1 tablet (500 mg total) by mouth every 6 (six) hours as needed for muscle spasms.   midodrine 5 MG tablet Commonly known as: PROAMATINE Take 5 mg by mouth 3 (three) times daily with meals.   multivitamin with minerals Tabs tablet Take 1 tablet by mouth  daily. Start taking on: Feb 02, 2023   Polyethyl Glycol-Propyl Glycol 0.4-0.3 % Soln Place 1 drop into both eyes 2 (two) times daily as needed (dry eyes).   polyethylene glycol 17 g packet Commonly known as: MIRALAX / GLYCOLAX Take 17 g by mouth daily.   rOPINIRole 0.25 MG tablet Commonly known as: REQUIP Take 1 tablet (0.25 mg total) by mouth at bedtime.   sertraline 50 MG tablet Commonly known as: ZOLOFT Take 50 mg by mouth daily.   sertraline 25 MG tablet Commonly known as: ZOLOFT Take 25 mg by mouth daily.   thiamine 100 MG tablet Commonly known as: Vitamin B-1 Take 1 tablet (100 mg total) by mouth daily. Start taking on: Feb 02, 2023   traMADol 50 MG tablet Commonly known as: ULTRAM Take 1 tablet (50 mg total) by mouth every 6 (six) hours as needed for moderate pain.   traZODone 50 MG tablet Commonly known as: DESYREL Take 50 mg by mouth at bedtime as needed for sleep. What changed: Another medication with the same name was removed. Continue taking this medication, and follow the directions you see here.        Follow-up Information     Dedra Skeens, PA-C Follow up in 4 day(s).   Specialty: Orthopedic Surgery Why: for staple removal and xrays of the Right hip Contact information: [address] [place] [address] [phone]                Discharge Exam: Ceasar Mons Weights   01/28/23 0447 01/31/23 0500  02/01/23 0525  Weight: 77.5 kg 77.3 kg 76.7 kg   Physical Exam HENT:     Head: Normocephalic.     Mouth/Throat:     Pharynx: No oropharyngeal exudate.  Eyes:     General: Lids are normal.     Conjunctiva/sclera: Conjunctivae normal.  Cardiovascular:     Rate and Rhythm: Normal rate and regular rhythm.     Heart sounds: Normal heart sounds, S1 normal and S2 normal.  Pulmonary:     Breath sounds: No decreased breath sounds, wheezing, rhonchi or rales.  Abdominal:     Palpations: Abdomen is soft.     Tenderness: There is no abdominal tenderness.  Musculoskeletal:     Right lower leg: No swelling.     Left lower leg: No swelling.  Skin:    General: Skin is warm.     Findings: No rash.  Neurological:     Mental Status: She is alert and oriented to person, place, and time.      Condition at discharge: stable  The results of significant diagnostics from this hospitalization (including imaging, microbiology, ancillary and laboratory) are listed below for reference.   Imaging Studies: ECHOCARDIOGRAM COMPLETE  Result Date: 01/22/2023    ECHOCARDIOGRAM REPORT   Patient Name:   DASIAH NEES Date of Exam: 01/22/2023 Medical Rec #:  [id number]         Height:       63.0 in Accession #:    [id number]        Weight:       154.5 lb Date of Birth:  Apr 16, 1941        BSA:          1.733 m Patient Age:    81 years          BP:           121/42 mmHg Patient Gender: F  HR:           61 bpm. Exam Location:  [facility] Procedure: 2D Echo, Cardiac Doppler and Color Doppler Indications:     Pulmonary hypertension I27.2  History:         Patient has prior history of Echocardiogram examinations, most                  recent 03/14/2018. Pacemaker, Arrythmias:LBBB; Risk                  Factors:Hypertension and Diabetes.  Sonographer:     Cristela Blue Referring Phys:  [id number] Ezequiel Essex Diagnosing Phys: Julien Nordmann MD IMPRESSIONS  1. Left ventricular ejection fraction, by estimation, is 60 to  65%. Left ventricular ejection fraction by PLAX is 63 %. The left ventricle has normal function. The left ventricle has no regional wall motion abnormalities. Left ventricular diastolic parameters are indeterminate.  2. Right ventricular systolic function is low normal. The right ventricular size is mildly enlarged. There is moderately elevated pulmonary artery systolic pressure. The estimated right ventricular systolic pressure is 51.2 mmHg.  3. The mitral valve is normal in structure. Mild mitral valve regurgitation. No evidence of mitral stenosis.  4. Tricuspid valve regurgitation is moderate to severe.  5. The aortic valve is tricuspid. Aortic valve regurgitation is not visualized. No aortic stenosis is present.  6. The inferior vena cava is normal in size with greater than 50% respiratory variability, suggesting right atrial pressure of 3 mmHg. FINDINGS  Left Ventricle: Left ventricular ejection fraction, by estimation, is 60 to 65%. Left ventricular ejection fraction by PLAX is 63 %. The left ventricle has normal function. The left ventricle has no regional wall motion abnormalities. The left ventricular internal cavity size was normal in size. There is no left ventricular hypertrophy. Left ventricular diastolic parameters are indeterminate. Right Ventricle: The right ventricular size is mildly enlarged. No increase in right ventricular wall thickness. Right ventricular systolic function is low normal. There is moderately elevated pulmonary artery systolic pressure. The tricuspid regurgitant  velocity is 3.40 m/s, and with an assumed right atrial pressure of 5 mmHg, the estimated right ventricular systolic pressure is 51.2 mmHg. Left Atrium: Left atrial size was normal in size. Right Atrium: Right atrial size was normal in size. Pericardium: There is no evidence of pericardial effusion. Mitral Valve: The mitral valve is normal in structure. There is mild calcification of the mitral valve leaflet(s). Mild  mitral valve regurgitation. No evidence of mitral valve stenosis. MV peak gradient, 8.9 mmHg. The mean mitral valve gradient is 4.0 mmHg. Tricuspid Valve: The tricuspid valve is normal in structure. Tricuspid valve regurgitation is moderate to severe. No evidence of tricuspid stenosis. Aortic Valve: The aortic valve is tricuspid. Aortic valve regurgitation is not visualized. No aortic stenosis is present. Aortic valve mean gradient measures 3.0 mmHg. Aortic valve peak gradient measures 5.7 mmHg. Aortic valve area, by VTI measures 2.89 cm. Pulmonic Valve: The pulmonic valve was normal in structure. Pulmonic valve regurgitation is not visualized. No evidence of pulmonic stenosis. Aorta: The aortic root is normal in size and structure. Venous: The inferior vena cava is normal in size with greater than 50% respiratory variability, suggesting right atrial pressure of 3 mmHg. IAS/Shunts: No atrial level shunt detected by color flow Doppler. Additional Comments: A device lead is visualized.  LEFT VENTRICLE PLAX 2D LV EF:         Left  Diastology                ventricular     LV e' medial:    18.90 cm/s                ejection        LV E/e' medial:  6.5                fraction by     LV e' lateral:   15.90 cm/s                PLAX is 63      LV E/e' lateral: 7.7                %. LVIDd:         4.70 cm LVIDs:         3.10 cm LV PW:         0.90 cm LV IVS:        0.90 cm LVOT diam:     2.00 cm LV SV:         55 LV SV Index:   32 LVOT Area:     3.14 cm  RIGHT VENTRICLE RV Basal diam:  3.80 cm RV Mid diam:    3.60 cm RV S prime:     19.80 cm/s TAPSE (M-mode): 2.6 cm LEFT ATRIUM           Index        RIGHT ATRIUM           Index LA diam:      4.20 cm 2.42 cm/m   RA Area:     24.30 cm LA Vol (A2C): 53.1 ml 30.64 ml/m  RA Volume:   82.80 ml  47.78 ml/m LA Vol (A4C): 53.1 ml 30.64 ml/m  AORTIC VALVE AV Area (Vmax):    2.28 cm AV Area (Vmean):   2.17 cm AV Area (VTI):     2.89 cm AV Vmax:           119.00  cm/s AV Vmean:          84.800 cm/s AV VTI:            0.190 m AV Peak Grad:      5.7 mmHg AV Mean Grad:      3.0 mmHg LVOT Vmax:         86.20 cm/s LVOT Vmean:        58.500 cm/s LVOT VTI:          0.175 m LVOT/AV VTI ratio: 0.92  AORTA Ao Root diam: 2.70 cm MITRAL VALVE                TRICUSPID VALVE MV Area (PHT): 3.10 cm     TR Peak grad:   46.2 mmHg MV Area VTI:   1.45 cm     TR Vmax:        340.00 cm/s MV Peak grad:  8.9 mmHg MV Mean grad:  4.0 mmHg     SHUNTS MV Vmax:       1.49 m/s     Systemic VTI:  0.18 m MV Vmean:      91.4 cm/s    Systemic Diam: 2.00 cm MV Decel Time: 245 msec MV E velocity: 122.00 cm/s MV A velocity: 88.00 cm/s MV E/A ratio:  1.39 Julien Nordmann MD Electronically signed by Julien Nordmann MD Signature Date/Time: 01/22/2023/9:16:37 AM    Final (Updated)  US Abdomen Limited RUQ (LIVER/GB)  Result Date: 01/21/2023 CLINICAL DATA:  Elevated LFTs EXAM: ULTRASOUND ABDOMEN LIMITED RIGHT UPPER QUADRANT COMPARISON:  03/04/2022 FINDINGS: Gallbladder: Surgically removed Common bile duct: Diameter: 6.4 mm. Liver: Increased echogenicity without focal mass likely related to fatty infiltration. Portal vein is patent on color Doppler imaging with normal direction of blood flow towards the liver. Other: None. IMPRESSION: Fatty liver. Status post cholecystectomy. Electronically Signed   By: Alcide Clever M.D.   On: 01/21/2023 20:22   DG Chest Port 1 View  Result Date: 01/21/2023 CLINICAL DATA:  Check central line placement EXAM: PORTABLE CHEST 1 VIEW COMPARISON:  01/20/23 FINDINGS: Cardiac shadow is stable. Pacing device is again seen. New right jugular central line is seen with the catheter tip in the distal superior vena cava. No pneumothorax is seen. The lungs are clear. Changes of prior vertebral augmentation are noted in the lower thoracic spine. IMPRESSION: No pneumothorax following central line placement. Electronically Signed   By: Alcide Clever M.D.   On: 01/21/2023 20:17   DG HIP UNILAT  WITH PELVIS 2-3 VIEWS LEFT  Result Date: 01/21/2023 CLINICAL DATA:  Right hip fracture repair EXAM: DG HIP (WITH OR WITHOUT PELVIS) 2-3V LEFT COMPARISON:  None Available. FINDINGS: Seven fluoroscopic images were obtained during right hip fracture repair. By the end of the study, gamma nails and an intramedullary femoral rod extend across the fracture site with a distal interlocking screw. No other abnormalities. IMPRESSION: Right hip fracture repair as above. Electronically Signed   By: Gerome Sam III M.D.   On: 01/21/2023 12:15   DG C-Arm 1-60 Min-No Report  Result Date: 01/21/2023 Fluoroscopy was utilized by the requesting physician.  No radiographic interpretation.   DG C-Arm 1-60 Min-No Report  Result Date: 01/21/2023 Fluoroscopy was utilized by the requesting physician.  No radiographic interpretation.   DG Chest Portable 1 View  Result Date: 01/20/2023 CLINICAL DATA:  Tripped and fell, right hip pain EXAM: PORTABLE CHEST 1 VIEW COMPARISON:  06/03/2022 FINDINGS: Single frontal view of the chest demonstrates a stable dual lead pacemaker. Cardiac silhouette is unremarkable. No acute airspace disease, effusion, or pneumothorax. No acute displaced fracture. Chronic lower thoracic compression deformity and prior vertebral augmentation. IMPRESSION: 1. No acute intrathoracic process. Electronically Signed   By: Sharlet Salina M.D.   On: 01/20/2023 11:04   DG Hip Unilat W or Wo Pelvis 2-3 Views Right  Result Date: 01/20/2023 CLINICAL DATA:  Right hip pain EXAM: DG HIP (WITH OR WITHOUT PELVIS) 2-3V RIGHT COMPARISON:  04/17/2018 FINDINGS: Frontal view of the pelvis as well as frontal and frogleg lateral views of the right hip are obtained. There is a comminuted intertrochanteric right hip fracture, with impaction and varus angulation at the fracture site. No dislocation. Mild symmetrical bilateral hip osteoarthritis. Postsurgical changes at the lumbosacral junction. IMPRESSION: 1. Comminuted  intertrochanteric right hip fracture, with impaction and varus angulation at the fracture site. 2. Mild symmetrical bilateral hip osteoarthritis. Electronically Signed   By: Sharlet Salina M.D.   On: 01/20/2023 11:01   CUP PACEART INCLINIC DEVICE CHECK  Result Date: 01/03/2023 Pacemaker check in clinic. Normal device function. Thresholds, sensing, impedances consistent with previous measurements. Device programmed to maximize longevity. Inc AF burden ~05/2022 resulting in ATP. see OV note for details. Device programmed at appropriate safety margins. Histogram distribution appropriate for patient activity level. Device programmed to optimize intrinsic conduction. Estimated longevity 7.2 years. Patient enrolled in remote follow-up. Patient education completed.   Microbiology: Results for  orders placed or performed during the hospital encounter of 01/20/23  MRSA Next Gen by PCR, Nasal     Status: None   Collection Time: 01/20/23 11:00 PM   Specimen: Nasal Mucosa; Nasal Swab  Result Value Ref Range Status   MRSA by PCR Next Gen NOT DETECTED NOT DETECTED Final    Comment: (NOTE) The GeneXpert MRSA Assay (FDA approved for NASAL specimens only), is one component of a comprehensive MRSA colonization surveillance program. It is not intended to diagnose MRSA infection nor to guide or monitor treatment for MRSA infections. Test performance is not FDA approved in patients less than 38 years old. Performed at [facility], [address] [place], [place] [address]   Culture, blood (Routine X 2) w Reflex to ID Panel     Status: None   Collection Time: 01/21/23  1:37 PM   Specimen: BLOOD  Result Value Ref Range Status   Specimen Description BLOOD BLOOD RIGHT HAND  Final   Special Requests   Final    BOTTLES DRAWN AEROBIC AND ANAEROBIC Blood Culture results may not be optimal due to an inadequate volume of blood received in culture bottles   Culture   Final    NO GROWTH 5 DAYS Performed at  [facility], [address] [place], [place] [address]    Report Status 01/26/2023 FINAL  Final  Culture, blood (Routine X 2) w Reflex to ID Panel     Status: None   Collection Time: 01/21/23  3:03 PM   Specimen: BLOOD RIGHT HAND  Result Value Ref Range Status   Specimen Description BLOOD RIGHT HAND  Final   Special Requests   Final    BOTTLES DRAWN AEROBIC ONLY Blood Culture results may not be optimal due to an inadequate volume of blood received in culture bottles   Culture   Final    NO GROWTH 5 DAYS Performed at [facility], [address] [place], [place] [address]    Report Status 01/26/2023 FINAL  Final  MRSA Next Gen by PCR, Nasal     Status: None   Collection Time: 01/21/23  6:30 PM   Specimen: Nasal Mucosa; Nasal Swab  Result Value Ref Range Status   MRSA by PCR Next Gen NOT DETECTED NOT DETECTED Final    Comment: (NOTE) The GeneXpert MRSA Assay (FDA approved for NASAL specimens only), is one component of a comprehensive MRSA colonization surveillance program. It is not intended to diagnose MRSA infection nor to guide or monitor treatment for MRSA infections. Test performance is not FDA approved in patients less than 60 years old. Performed at [facility], [address] [place], [place] [address]   SARS Coronavirus 2 by RT PCR (hospital order, performed in [facility] hospital lab) *cepheid single result test* Anterior Nasal Swab     Status: Abnormal   Collection Time: 01/22/23  8:13 AM   Specimen: Anterior Nasal Swab  Result Value Ref Range Status   SARS Coronavirus 2 by RT PCR POSITIVE (A) NEGATIVE Final    Comment: (NOTE) SARS-CoV-2 target nucleic acids are DETECTED  SARS-CoV-2 RNA is generally detectable in upper respiratory specimens  during the acute phase of infection.  Positive results are indicative  of the presence of the identified virus, but do not rule out bacterial infection or co-infection with other pathogens  not detected by the test.  Clinical correlation with patient history and  other diagnostic information is necessary to determine patient infection status.  The expected result is negative.  Fact Sheet for Patients:   RoadLapTop.co.za   Fact Sheet for Healthcare Providers:   http://kim-miller.com/    This test is not yet approved or cleared by the [place] FDA and  has been authorized for detection and/or diagnosis of SARS-CoV-2 by FDA under an Emergency Use Authorization (EUA).  This EUA will remain in effect (meaning this test can be used) for the duration of  the COVID-19 declaration under Section 564(b)(1)  of the Act, 21 U.S.C. section 360-bbb-3(b)(1), unless the authorization is terminated or revoked sooner.   Performed at [facility], [address] [place], [place] [address]     Labs: CBC: Recent Labs  Lab 01/31/23 0552  WBC 5.3  HGB 9.1*  HCT 28.5*  MCV 101.8*  PLT 218   Basic Metabolic Panel: No results for input(s): "NA", "K", "CL", "CO2", "GLUCOSE", "BUN", "CREATININE", "CALCIUM", "MG", "PHOS" in the last 168 hours. Liver Function Tests: No results for input(s): "AST", "ALT", "ALKPHOS", "BILITOT", "PROT", "ALBUMIN" in the last 168 hours. CBG: Recent Labs  Lab 01/31/23 1625 01/31/23 2005 02/01/23 0007 02/01/23 0347 02/01/23 0831  GLUCAP 187* 134* 72 105* 129*    Discharge time spent: greater than 30 minutes.  Signed: Alford Highland, MD [facility] 02/01/2023

## 2023-02-01 NOTE — Plan of Care (Signed)
  Problem: Education: Goal: Knowledge of General Education information will improve Description: Including pain rating scale, medication(s)/side effects and non-pharmacologic comfort measures Outcome: Progressing   Problem: Health Behavior/Discharge Planning: Goal: Ability to manage health-related needs will improve Outcome: Progressing   Problem: Clinical Measurements: Goal: Ability to maintain clinical measurements within normal limits will improve Outcome: Progressing Goal: Will remain free from infection Outcome: Progressing Goal: Diagnostic test results will improve Outcome: Progressing Goal: Respiratory complications will improve Outcome: Progressing Goal: Cardiovascular complication will be avoided Outcome: Progressing   Problem: Activity: Goal: Risk for activity intolerance will decrease Outcome: Progressing   Problem: Nutrition: Goal: Adequate nutrition will be maintained Outcome: Progressing   Problem: Coping: Goal: Level of anxiety will decrease Outcome: Progressing   Problem: Elimination: Goal: Will not experience complications related to bowel motility Outcome: Progressing Goal: Will not experience complications related to urinary retention Outcome: Progressing   Problem: Pain Managment: Goal: General experience of comfort will improve Outcome: Progressing   Problem: Safety: Goal: Ability to remain free from injury will improve Outcome: Progressing   Problem: Skin Integrity: Goal: Risk for impaired skin integrity will decrease Outcome: Progressing   Problem: Education: Goal: Ability to describe self-care measures that may prevent or decrease complications (Diabetes Survival Skills Education) will improve Outcome: Progressing Goal: Individualized Educational Video(s) Outcome: Progressing   Problem: Coping: Goal: Ability to adjust to condition or change in health will improve Outcome: Progressing   Problem: Fluid Volume: Goal: Ability to  maintain a balanced intake and output will improve Outcome: Progressing   Problem: Health Behavior/Discharge Planning: Goal: Ability to identify and utilize available resources and services will improve Outcome: Progressing Goal: Ability to manage health-related needs will improve Outcome: Progressing   Problem: Metabolic: Goal: Ability to maintain appropriate glucose levels will improve Outcome: Progressing   Problem: Nutritional: Goal: Maintenance of adequate nutrition will improve Outcome: Progressing Goal: Progress toward achieving an optimal weight will improve Outcome: Progressing   Problem: Skin Integrity: Goal: Risk for impaired skin integrity will decrease Outcome: Progressing   Problem: Tissue Perfusion: Goal: Adequacy of tissue perfusion will improve Outcome: Progressing   Problem: Education: Goal: Knowledge of risk factors and measures for prevention of condition will improve Outcome: Progressing   Problem: Coping: Goal: Psychosocial and spiritual needs will be supported Outcome: Progressing   Problem: Respiratory: Goal: Will maintain a patent airway Outcome: Progressing Goal: Complications related to the disease process, condition or treatment will be avoided or minimized Outcome: Progressing   

## 2023-02-01 NOTE — TOC Progression Note (Signed)
Transition of Care [facility]) - Progression Note    Patient Details  Name: Deborah Obrien MRN: [id number] Date of Birth: 31-Jul-1941  [facility]) CM/SW Contact  Marlowe Sax, RN Phone Number: 02/01/2023, 8:37 AM  Clinical Narrative:   Ins auth approved next review 5/24 Auth ID [id number]    Expected Discharge Plan: Skilled Nursing Facility Barriers to Discharge: Continued Medical Work up  Expected Discharge Plan and Services       Living arrangements for the past 2 months: Assisted Living Facility ([facility])                                       Social Determinants of Health (SDOH) Interventions SDOH Screenings   Food Insecurity: No Food Insecurity (01/20/2023)  Housing: Low Risk  (01/20/2023)  Transportation Needs: No Transportation Needs (01/20/2023)  Utilities: Not At Risk (01/20/2023)  Tobacco Use: Low Risk  (01/23/2023)    Readmission Risk Interventions     No data to display

## 2023-02-01 NOTE — Assessment & Plan Note (Signed)
Patient on lantus insulin 9 units at night with short acting insulin prior to meals

## 2023-02-01 NOTE — Assessment & Plan Note (Signed)
Resolved.  Required bipap initially after surgery

## 2023-02-01 NOTE — TOC Progression Note (Signed)
Transition of Care [facility]) - Progression Note    Patient Details  Name: Deborah Obrien MRN: [id number] Date of Birth: Oct 18, 1940  Transition of Care [facility]) CM/SW Contact  Marlowe Sax, RN Phone Number: 02/01/2023, 10:44 AM  Clinical Narrative:     Spoke with the son Caryn Bee Provided room number 502 at [facility] he stated that he does not feel comfortable transporting the patient to [facility] because she is so unsteady, If Insurance does not cover EMS he will pay it  EMS called to transport there are 1 ahead of her  Expected Discharge Plan: Skilled Nursing Facility Barriers to Discharge: Continued Medical Work up  Expected Discharge Plan and Services       Living arrangements for the past 2 months: Assisted Living Facility ([facility]) Expected Discharge Date: 02/01/23                                     Social Determinants of Health (SDOH) Interventions SDOH Screenings   Food Insecurity: No Food Insecurity (01/20/2023)  Housing: Low Risk  (01/20/2023)  Transportation Needs: No Transportation Needs (01/20/2023)  Utilities: Not At Risk (01/20/2023)  Tobacco Use: Low Risk  (01/23/2023)    Readmission Risk Interventions     No data to display

## 2023-02-01 NOTE — Progress Notes (Signed)
Report called to Amy at [facility]. 

## 2023-02-07 DIAGNOSIS — I4892 Unspecified atrial flutter: Secondary | ICD-10-CM | POA: Diagnosis not present

## 2023-02-07 DIAGNOSIS — I10 Essential (primary) hypertension: Secondary | ICD-10-CM | POA: Diagnosis not present

## 2023-02-07 DIAGNOSIS — S72001A Fracture of unspecified part of neck of right femur, initial encounter for closed fracture: Secondary | ICD-10-CM | POA: Diagnosis not present

## 2023-02-07 DIAGNOSIS — E119 Type 2 diabetes mellitus without complications: Secondary | ICD-10-CM | POA: Diagnosis not present

## 2023-02-08 DIAGNOSIS — M25551 Pain in right hip: Secondary | ICD-10-CM | POA: Diagnosis not present

## 2023-02-10 LAB — BLOOD GAS, ARTERIAL
Acid-base deficit: 6.2 mmol/L — ABNORMAL HIGH (ref 0.0–2.0)
Acid-base deficit: 9.9 mmol/L — ABNORMAL HIGH (ref 0.0–2.0)
Acid-base deficit: 7.7 mmol/L — ABNORMAL HIGH (ref 0.0–2.0)
Bicarbonate: 22.9 mmol/L (ref 20.0–28.0)
Bicarbonate: 16.1 mmol/L — ABNORMAL LOW (ref 20.0–28.0)
Bicarbonate: 20.4 mmol/L (ref 20.0–28.0)
Bicarbonate: 21.1 mmol/L (ref 20.0–28.0)
Bicarbonate: 21.7 mmol/L (ref 20.0–28.0)
Bicarbonate: 19.4 mmol/L — ABNORMAL LOW (ref 20.0–28.0)
Delivery systems: POSITIVE
Delivery systems: POSITIVE
Delivery systems: POSITIVE
Expiratory PAP: 5 cmH2O
Expiratory PAP: 5 cmH2O
FIO2: 28 %
FIO2: 28 %
FIO2: 28 %
FIO2: 28 %
Inspiratory PAP: 10 cmH2O
Inspiratory PAP: 10 cmH2O
Inspiratory PAP: 12 cmH2O
Mechanical Rate: 10
O2 Content: 2 L/min
O2 Saturation: 99.2 %
O2 Saturation: 99.2 %
O2 Saturation: 99 %
O2 Saturation: 99.2 %
O2 Saturation: 98.8 %
Patient temperature: 37
Patient temperature: 37
Patient temperature: 37
Patient temperature: 37
Patient temperature: 37
pCO2 arterial: 51 mmHg — ABNORMAL HIGH (ref 32–48)
pCO2 arterial: 40 mmHg (ref 32–48)
pCO2 arterial: 47 mmHg (ref 32–48)
pCO2 arterial: 53 mmHg — ABNORMAL HIGH (ref 32–48)
pCO2 arterial: 36 mmHg (ref 32–48)
pH, Arterial: 7.19 — CL (ref 7.35–7.45)
pH, Arterial: 7.27 — ABNORMAL LOW (ref 7.35–7.45)
pH, Arterial: 7.21 — ABNORMAL LOW (ref 7.35–7.45)
pH, Arterial: 7.3 — ABNORMAL LOW (ref 7.35–7.45)
pH, Arterial: 7.26 — ABNORMAL LOW (ref 7.35–7.45)
pH, Arterial: 7.22 — ABNORMAL LOW (ref 7.35–7.45)
pH, Arterial: 7.34 — ABNORMAL LOW (ref 7.35–7.45)
pO2, Arterial: 72 mmHg — ABNORMAL LOW (ref 83–108)
pO2, Arterial: 142 mmHg — ABNORMAL HIGH (ref 83–108)
pO2, Arterial: 104 mmHg (ref 83–108)
pO2, Arterial: 110 mmHg — ABNORMAL HIGH (ref 83–108)
pO2, Arterial: 111 mmHg — ABNORMAL HIGH (ref 83–108)
pO2, Arterial: 102 mmHg (ref 83–108)

## 2023-02-28 ENCOUNTER — Encounter: Payer: Medicare Other | Admitting: Internal Medicine

## 2023-04-22 ENCOUNTER — Emergency Department
Admission: EM | Admit: 2023-04-22 | Discharge: 2023-04-22 | Disposition: A | Discharge status: Home / Self Care | Payer: Medicare Other | Attending: Emergency Medicine | Admitting: Emergency Medicine

## 2023-04-22 ENCOUNTER — Other Ambulatory Visit: Payer: Self-pay

## 2023-04-22 ENCOUNTER — Emergency Department: Payer: Medicare Other

## 2023-04-22 ENCOUNTER — Encounter: Payer: Self-pay | Admitting: *Deleted

## 2023-04-22 DIAGNOSIS — N189 Chronic kidney disease, unspecified: Secondary | ICD-10-CM | POA: Diagnosis not present

## 2023-04-22 DIAGNOSIS — Z7901 Long term (current) use of anticoagulants: Secondary | ICD-10-CM | POA: Diagnosis not present

## 2023-04-22 DIAGNOSIS — I4891 Unspecified atrial fibrillation: Secondary | ICD-10-CM | POA: Insufficient documentation

## 2023-04-22 DIAGNOSIS — M25551 Pain in right hip: Secondary | ICD-10-CM | POA: Insufficient documentation

## 2023-04-22 LAB — CBG MONITORING, ED: Glucose-Capillary: 139 mg/dL — ABNORMAL HIGH (ref 70–99)

## 2023-04-22 MED ORDER — TRAMADOL HCL 50 MG PO TABS
50.0000 mg | ORAL_TABLET | ORAL | Status: AC
  Administered 2023-04-22: 50 mg via ORAL
  Filled 2023-04-22: qty 1

## 2023-04-22 MED ORDER — FENTANYL CITRATE PF 50 MCG/ML IJ SOSY
25.0000 ug | PREFILLED_SYRINGE | Freq: Once | INTRAMUSCULAR | Status: AC
  Administered 2023-04-22: 25 ug via INTRAMUSCULAR
  Filled 2023-04-22: qty 1

## 2023-04-22 MED ORDER — FENTANYL CITRATE PF 50 MCG/ML IJ SOSY: 25.0000 ug | PREFILLED_SYRINGE | Freq: Once | INTRAMUSCULAR | Status: DC

## 2023-04-22 NOTE — ED Triage Notes (Signed)
Per EMS report, patient had right hip surgery with screws placed for a repair for a fracture in May 2024. Patient had severe pain upon standing today. Patient had an xray in June which showed some of the screws were "coming out."

## 2023-04-22 NOTE — ED Provider Notes (Signed)
[facility] Provider Note    Event Date/Time   First MD Initiated Contact with Patient 04/22/23 1310     (approximate)   History   Hip Pain   HPI  Deborah Obrien is a 82 y.o. female history of previous right hip fracture after a fall, also A-fib on anticoagulation and chronic kidney disease  Patient has been having pain and discomfort in her right hip now for a couple months, and was told at one of her follow-up visits that there was possibly a change in her screws or something a potential concern on a follow-up x-ray but they were hopeful it would heal  She has been struggling with achy discomfort in the right hip area for months, but today she put on her pants and when she went to go stand she stood up and suddenly felt a sudden severe pain in her right hip joint.  There was no fall but is significant pain, she is no longer able to use the right leg without pain in the right hip area.  She feels like something is wrong with the hip joint  No recent illness other than around Thursday last week she had some bodyaches and fatigue but that has since subsided     Physical Exam   Triage Vital Signs: ED Triage Vitals  Encounter Vitals Group     BP 04/22/23 1317 (!) 161/69     Systolic BP Percentile --      Diastolic BP Percentile --      Pulse Rate 04/22/23 1317 60     Resp 04/22/23 1317 18     Temp 04/22/23 1317 98 F (36.7 C)     Temp Source 04/22/23 1317 Oral     SpO2 04/22/23 1317 100 %     Weight 04/22/23 1321 148 lb (67.1 kg)     Height 04/22/23 1321 5\' 3"  (1.6 m)     Head Circumference --      Peak Flow --      Pain Score 04/22/23 1319 8     Pain Loc --      Pain Education --      Exclude from Growth Chart --     Most recent vital signs: Vitals:   04/22/23 1317 04/22/23 1635  BP: (!) 161/69 (!) 147/65  Pulse: 60 61  Resp: 18 16  Temp: 98 F (36.7 C) 98.1 F (36.7 C)  SpO2: 100% 97%     General: Awake, no distress.   Normocephalic atraumatic CV:  Good peripheral perfusion.  Strong palpable right lower extremity pulses Resp:  Normal effort.  Abd:  No distention.  Soft nontender nondistended Other:  No noted deformity shortening or rotation of any extremity.  The right lower extremity she is not able to go through full range of motion of the hip joint due to limitation of pain she reports notable pain over the proximal lateral femur and trochanteric region.   ED Results / Procedures / Treatments   Labs (all labs ordered are listed, but only abnormal results are displayed) Labs Reviewed  CBG MONITORING, ED - Abnormal; Notable for the following components:      Result Value   Glucose-Capillary 139 (*)    All other components within normal limits     EKG     RADIOLOGY  Right hip x-ray interpreted by me as negative for obvious fracture  Consulted with Dr. Arty Baumgartner on the phone regarding the patient's complaint sudden onset of  pain in the right hip joint area with standing today.  No fall.  Who he we as well as her previous surgical history and follow-ups.  He recommends that at this juncture noncontrast CT of the pelvis would be reasonable follow-up study   CT PELVIS WO CONTRAST  Result Date: 04/22/2023 CLINICAL DATA:  Hip trauma, fracture suspected, no prior imaging EXAM: CT PELVIS WITHOUT CONTRAST TECHNIQUE: Multidetector CT imaging of the pelvis was performed following the standard protocol without intravenous contrast. RADIATION DOSE REDUCTION: This exam was performed according to the departmental dose-optimization program which includes automated exposure control, adjustment of the mA and/or kV according to patient size and/or use of iterative reconstruction technique. COMPARISON:  Hip x-ray 04/22/2023, CT 03/04/2022 FINDINGS: Urinary Tract:  No abnormality visualized. Bowel: No evidence of bowel obstruction or active bowel inflammation within the pelvis. Vascular/Lymphatic: No pathologically  enlarged lymph nodes. No significant vascular abnormality seen. Atherosclerotic vascular calcification. Reproductive:  No mass or other significant abnormality Other:  No free air or free fluid within the pelvis. Musculoskeletal: Postsurgical changes from ORIF of an intertrochanteric fracture of the proximal right femur. Fracture lines remain visible, some of which demonstrate a degree of marginal sclerosis. No acute fracture is identified. Hip joint alignment is maintained without dislocation. Degenerative changes of both hips. Bony pelvis intact without fracture or diastasis. No lytic or sclerotic bone lesion. Partially imaged lumbosacral fusion. No soft tissue swelling or hematoma about the pelvis. IMPRESSION: 1. No acute fracture or dislocation of the pelvis or bilateral hips. 2. Postsurgical changes from ORIF of an intertrochanteric fracture of the proximal right femur. Fracture lines remain visible. Electronically Signed   By: Duanne Guess D.O.   On: 04/22/2023 16:14   PROCEDURES:  Critical Care performed: No  Procedures   MEDICATIONS ORDERED IN ED: Medications  fentaNYL (SUBLIMAZE) injection 25 mcg (25 mcg Intramuscular Given 04/22/23 1442)  traMADol (ULTRAM) tablet 50 mg (50 mg Oral Given 04/22/23 1441)     IMPRESSION / MDM / ASSESSMENT AND PLAN / ED COURSE  I reviewed the triage vital signs and the nursing notes.                              Differential diagnosis includes, but is not limited to, potential acute musculoskeletal pain in the right hip area.  She has focal pain over the right hip trochanteric region no obvious deformity shortening etc.  Neurovascularly intact strong pulses the right lower extremity normal sensation right foot demonstrates good use of the right foot knee but pain with flexion at the hip joint.  Abdomen soft nontender nondistended.  No fall or injury  At this juncture, will obtain x-ray to evaluate for possible failure or displacement of her previous  surgical site, but also evaluate for other underlying bony injury or fracture.  Normal and reassuring hemodynamics.  No signs or symptoms of preceding illness.  No abdominal symptoms or GI symptoms.  Patient's presentation is most consistent with acute complicated illness / injury requiring diagnostic workup.  Patient had difficult IV access, IV team at bedside also having difficulty establishing IV for pain medication.  At this juncture given her x-ray shows no obvious fracture we are awaiting noncontrast CT discussed with patient we will discontinue attempts to place IV and will instead use intramuscular medication and tramadol which she has prescribed   ----------------------------------------- 5:30 PM on 04/22/2023 ----------------------------------------- Patient reports pain is well-controlled, she is able to stand  and walk with assistance.  Typically uses a walker but for the last 2 weeks has intermittently also been using her wheelchair she reports because of this achy pain.  She feels like she is back to where she was a couple weeks ago and her severe pain is gone.  At this point suspect musculoskeletal cause, patient appears appropriate for follow-up with orthopedics.  No evidence to support acute infection, vascular, neurologic intra-abdominal or pelvic cause.  Reassuring examination  Discussed case with her son Caryn Bee patient's request  Return precautions and treatment recommendations and follow-up discussed with the patient who is agreeable with the plan.      FINAL CLINICAL IMPRESSION(S) / ED DIAGNOSES   Final diagnoses:  Acute pain of right hip     Rx / DC Orders   ED Discharge Orders     None        Note:  This document was prepared using Dragon voice recognition software and may include unintentional dictation errors.   Sharyn Creamer, MD 04/22/23 [phone]

## 2023-04-22 NOTE — ED Notes (Signed)
Purwick placed on pt

## 2023-05-22 ENCOUNTER — Telehealth: Payer: Self-pay

## 2023-05-22 NOTE — Telephone Encounter (Signed)
I ordered the patient a new monitor. She should receive it in 7-10 business days. 

## 2023-05-24 ENCOUNTER — Other Ambulatory Visit: Payer: Self-pay | Admitting: Orthopedic Surgery

## 2023-05-24 DIAGNOSIS — M4326 Fusion of spine, lumbar region: Secondary | ICD-10-CM

## 2023-05-26 ENCOUNTER — Encounter: Payer: Self-pay | Admitting: Orthopedic Surgery

## 2023-05-30 ENCOUNTER — Telehealth: Payer: Self-pay | Admitting: Internal Medicine

## 2023-05-30 NOTE — Telephone Encounter (Signed)
Patient states that she needs a letter to be sent to [facility]. Letter needs to state that she needs her monitor in order to do her transmission. Fax number [phone] Jarome Matin. Please advise

## 2023-05-30 NOTE — Telephone Encounter (Signed)
Looks like it was in regard to her transmission.

## 2023-05-30 NOTE — Telephone Encounter (Signed)
Patient would like appointment with Dr. Nelly Laurence to discuss her heart rates and medications. Patient is currently in the ER but would like a f/u apt.

## 2023-05-30 NOTE — Telephone Encounter (Signed)
She stated she didn't want to wait that long. She was upset on the phone.

## 2023-05-31 NOTE — Telephone Encounter (Signed)
I spoke with the patient and sent her an order to the [facility] for the patient to use her home remote monitor.

## 2023-05-31 NOTE — Telephone Encounter (Signed)
I tried to call the [facility] about the patient monitor. They tried to transfer me to the nurse but the voicemail was not taking messages.

## 2023-06-12 ENCOUNTER — Ambulatory Visit (INDEPENDENT_AMBULATORY_CARE_PROVIDER_SITE_OTHER): Payer: Medicare Other

## 2023-06-12 DIAGNOSIS — I480 Paroxysmal atrial fibrillation: Secondary | ICD-10-CM

## 2023-06-12 LAB — CUP PACEART REMOTE DEVICE CHECK
Battery Remaining Longevity: 75 mo
Battery Voltage: 2.9 V
Brady Statistic AP VP Percent: 0.04 %
Brady Statistic AP VS Percent: 95.68 %
Brady Statistic AS VP Percent: 0 %
Brady Statistic AS VS Percent: 4.27 %
Brady Statistic RA Percent Paced: 95.79 %
Brady Statistic RV Percent Paced: 0.05 %
Date Time Interrogation Session: 20240929200405
Implantable Lead Connection Status: 753985
Implantable Lead Connection Status: 753985
Implantable Lead Implant Date: 20161019
Implantable Lead Implant Date: 20181217
Implantable Lead Location: 753859
Implantable Lead Location: 753860
Implantable Lead Model: 5076
Implantable Lead Model: 5076
Implantable Pulse Generator Implant Date: 20181217
Lead Channel Impedance Value: 285 Ohm
Lead Channel Impedance Value: 342 Ohm
Lead Channel Impedance Value: 342 Ohm
Lead Channel Impedance Value: 418 Ohm
Lead Channel Pacing Threshold Amplitude: 0.75 V
Lead Channel Pacing Threshold Amplitude: 0.75 V
Lead Channel Pacing Threshold Pulse Width: 0.4 ms
Lead Channel Pacing Threshold Pulse Width: 0.4 ms
Lead Channel Sensing Intrinsic Amplitude: 4.125 mV
Lead Channel Sensing Intrinsic Amplitude: 4.125 mV
Lead Channel Sensing Intrinsic Amplitude: 5.125 mV
Lead Channel Sensing Intrinsic Amplitude: 5.125 mV
Lead Channel Setting Pacing Amplitude: 2 V
Lead Channel Setting Pacing Amplitude: 2.5 V
Lead Channel Setting Pacing Pulse Width: 0.4 ms
Lead Channel Setting Sensing Sensitivity: 1.2 mV
Zone Setting Status: 755011

## 2023-06-22 ENCOUNTER — Encounter: Payer: Self-pay | Admitting: Internal Medicine

## 2023-06-22 ENCOUNTER — Telehealth: Payer: Self-pay | Admitting: Internal Medicine

## 2023-06-22 NOTE — Telephone Encounter (Signed)
Patient Name: Deborah Obrien  DOB: 1940-09-21 MRN: [id number]  Primary Cardiologist: Sherryl Manges, MD  Chart reviewed as part of pre-operative protocol coverage.   Simple dental extractions (i.e. 1-2 teeth) are considered low risk procedures per guidelines and generally do not require any specific cardiac clearance. It is also generally accepted that for simple extractions and dental cleanings, there is no need to interrupt blood thinner therapy.   SBE prophylaxis is not required for the patient from a cardiac standpoint.  I will route this recommendation to the requesting party via Epic fax function and remove from pre-op pool.  Please call with questions.  Joylene Grapes, NP 06/22/2023, 4:02 PM

## 2023-06-22 NOTE — Telephone Encounter (Signed)
Made in error

## 2023-06-22 NOTE — Telephone Encounter (Signed)
Pre-operative Risk Assessment    Patient Name: Deborah Obrien  DOB: 1941/06/10 MRN: [id number]     Request for Surgical Clearance    Procedure:  Dental Extraction - Amount of Teeth to be Pulled:  1   Date of Surgery:  Clearance TBD                                 Surgeon:  not indicated Surgeon's Group or Practice Name:  [facility] dental implants Phone number:  [phone] Fax number:  [phone]   Type of Clearance Requested:   - Medical    Type of Anesthesia:  Local    Additional requests/questions:    SignedShawna Orleans   06/22/2023, 1:15 PM

## 2023-06-22 NOTE — Telephone Encounter (Signed)
sur

## 2023-06-26 NOTE — Progress Notes (Signed)
Remote pacemaker transmission.   

## 2023-06-30 NOTE — Progress Notes (Unsigned)
Cardiology Office Note Date:  07/03/2023  Patient ID:  Deborah Obrien, Deborah Obrien 1940-09-28, MRN [id number] PCP:  Melonie Florida, FNP  Cardiologist:  Sherryl Manges, MD Electrophysiologist: None    Chief Complaint: 6mon follow-up  History of Present Illness: Deborah Obrien is a 82 y.o. female with PMH notable for parox Afib/flutter, SND s/p PPM, HFpEF, CAD, CKD-4, T2DM, HTN, hypothyroid ; seen today for Sherryl Manges, MD for routine electrophysiology followup.  She last saw Dr. Graciela Husbands 03/2022 for add-on visit d/t increased lower extremity edema. Device functioning well, inc lasix 20 > 40 daily. 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 I saw her 12/2022, she had suffered a fall requiring multiple L arm surgeries and relocating to assisted living facility. AFib burden was elevated during arm injury period, but had significantly reduced as of late. She has suffered another fall 01/2023, fracturing her R hip. She was found to be covid positive and required pressors post-operatively.   On follow-up today, she is doing well from a cardiac standpoint. She had one episode about a month ago where she had some chest discomfort and SOB, episode was a few hours in duration and resolved spontaneously. After the episode, she discussed it with facility staff who recommended she bring it to their attention during the episode if it recurs. She has had no further episodes like this.  Her main complaint is R hip pain and "crunching" sensation with walking, has been following regularly with ortho for this. She had lab work recently at facility and says that she needs a liver ultrasound to ensure she does not have a stone. Lab work not provided from facility today in paperwork.     she denies chest pain, palpitations, dyspnea, PND, orthopnea, nausea, vomiting, dizziness, syncope, edema, weight gain, or early satiety.    Device Information: MDT dual chamber PPM Was originally Single chamber > added RA lead 08/2017  resulting in tension > RA lead revision  AAD History: Amiodarone   Past Medical History:  Diagnosis Date   Acid reflux    Acute respiratory failure (HCC) 04/09/2018   Anemia    Anxiety    Bowel obstruction (HCC)    Carotid artery disease (HCC)    Chronic heart failure with preserved ejection fraction (HFpEF) (HCC)    a. 03/2018 Echo: EF 60-65%, mildly dil LA/RA, mod dil RV, mod TR, PASP ; b. 01/2023 Echo: EF 60-65%, no rwma, low-nl RV fxn, RVSP 51.69mmHg. Mild MR. Mod-sev TR.   CKD (chronic kidney disease), stage IV (HCC)    stage IV   Closed fracture of one rib of right side 05/29/2020   Complete tear of left rotator cuff 10/16/2014   Coronary artery disease (non-obstructive)    a. 08/2016 Cath: LM 25, LAD 20p, LCX 15p, RCA nl, EF 55%, 2+ MR; 10/2017 MV: No isch/infarct, EF 55-65%.   Depression    Diabetes mellitus without complication (HCC)    Diabetic neuropathy (HCC)    Diarrhea 03/04/2022   Fatty liver    Gastritis without bleeding    GI bleed    Gout    Hypertension    Hypothyroidism    Low back pain    history of kyphoplasty t12-l1   Morbid obesity (HCC)    Multiple gastric polyps    history of gi bleed.  polyps removed   Osteoarthritis    Osteoporosis    PAF (paroxysmal atrial fibrillation) (HCC) 2016   a. On amio/eliquis (CHA2DS2VASc = 9).   Paroxysmal  atrial fibrillation (HCC)    Presence of permanent cardiac pacemaker    Pulmonary hypertension (HCC)    a. 03/2018 Echo: PASP .   Sinus node dysfunction (HCC) 08/24/2017   Sinus node dysfunction (HCC)    a. MDT- upgraded to dual chamber device Dec 2018- lead revision post upgrade dec 2018 (MDT [id number] Azure XT DR MRI).   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   Uncontrolled type 2 diabetes mellitus with hypoglycemia, with long-term current use of insulin (HCC) 03/04/2022     Past Surgical History:  Procedure Laterality Date   ABDOMINAL HYSTERECTOMY  1980   APPENDECTOMY     BACK SURGERY     2008. plate & screws    BACK SURGERY  2019   patient describes kyphoplasty for compression fractures, MD referral said fusion   BREAST BIOPSY Right 08/16   stereo fibroadenomatous change, neg for atypia   BREAST EXCISIONAL BIOPSY Left    neg   CARDIAC CATHETERIZATION Left 08/25/2016   Procedure: Left Heart Cath and Coronary Angiography;  Surgeon: Lamar Blinks, MD;  Location: [facility] INVASIVE CV LAB;  Service: Cardiovascular;  Laterality: Left;   CARDIOVERSION N/A 06/28/2017   Procedure: CARDIOVERSION;  Surgeon: Lamar Blinks, MD;  Location: [facility] ORS;  Service: Cardiovascular;  Laterality: N/A;   CARDIOVERSION N/A 02/06/2018   Procedure: CARDIOVERSION;  Surgeon: Wendall Stade, MD;  Location: [facility] ENDOSCOPY;  Service: Cardiovascular;  Laterality: N/A;   CARDIOVERSION N/A 07/15/2020   Procedure: CARDIOVERSION;  Surgeon: Jake Bathe, MD;  Location: [facility] ENDOSCOPY;  Service: Cardiovascular;  Laterality: N/A;   CATARACT EXTRACTION, BILATERAL  2012   CHOLECYSTECTOMY     colon blockage  1999   COLON SURGERY  2000   removed 20% of colon. colon had collapsed   COLONOSCOPY  2016   polyps removed 2016   DORSAL COMPARTMENT RELEASE Left 09/14/2016   Procedure: RELEASE DORSAL COMPARTMENT (DEQUERVAIN);  Surgeon: Donato Heinz, MD;  Location: [facility] ORS;  Service: Orthopedics;  Laterality: Left;   ESOPHAGOGASTRODUODENOSCOPY N/A 01/27/2015   Procedure: ESOPHAGOGASTRODUODENOSCOPY (EGD);  Surgeon: Christena Deem, MD;  Location: [facility] ENDOSCOPY;  Service: Endoscopy;  Laterality: N/A;   ESOPHAGOGASTRODUODENOSCOPY (EGD) WITH PROPOFOL N/A 07/24/2015   Procedure: ESOPHAGOGASTRODUODENOSCOPY (EGD) WITH PROPOFOL;  Surgeon: Midge Minium, MD;  Location: [facility] ENDOSCOPY;  Service: Endoscopy;  Laterality: N/A;   ESOPHAGOGASTRODUODENOSCOPY (EGD) WITH PROPOFOL N/A 04/12/2018   Procedure:  ESOPHAGOGASTRODUODENOSCOPY (EGD) WITH PROPOFOL;  Surgeon: Wyline Mood, MD;  Location: [facility] ENDOSCOPY;  Service: Gastroenterology;  Laterality: N/A;   ESOPHAGOGASTRODUODENOSCOPY (EGD) WITH PROPOFOL N/A 06/22/2018   Procedure: ESOPHAGOGASTRODUODENOSCOPY (EGD) WITH PROPOFOL;  Surgeon: Rachael Fee, MD;  Location: [facility] ENDOSCOPY;  Service: Endoscopy;  Laterality: N/A;   ESOPHAGOGASTRODUODENOSCOPY (EGD) WITH PROPOFOL N/A 07/09/2018   Procedure: ESOPHAGOGASTRODUODENOSCOPY (EGD) WITH PROPOFOL with resection of gastric polyps;  Surgeon: Wyline Mood, MD;  Location: [facility] ENDOSCOPY;  Service: Gastroenterology;  Laterality: N/A;   ESOPHAGOGASTRODUODENOSCOPY (EGD) WITH PROPOFOL N/A 05/17/2019   Procedure: ESOPHAGOGASTRODUODENOSCOPY (EGD) WITH PROPOFOL;  Surgeon: Wyline Mood, MD;  Location: [facility] ENDOSCOPY;  Service: Gastroenterology;  Laterality: N/A;   EUS N/A 06/22/2018   Procedure: UPPER ENDOSCOPIC ULTRASOUND (EUS) RADIAL;  Surgeon: Rachael Fee, MD;  Location: [facility] ENDOSCOPY;  Service: Endoscopy;  Laterality: N/A;   EYE SURGERY Bilateral    cataract extractions   HUMERUS IM NAIL Left 06/02/2022   Procedure: INTRAMEDULLARY (IM)  NAIL HUMERAL;  Surgeon: Christena Flake, MD;  Location: [facility] ORS;  Service: Orthopedics;  Laterality: Left;   INTRAMEDULLARY (IM) NAIL INTERTROCHANTERIC Right 01/21/2023   Procedure: INTRAMEDULLARY (IM) NAIL INTERTROCHANTERIC;  Surgeon: Signa Kell, MD;  Location: [facility] ORS;  Service: Orthopedics;  Laterality: Right;   JOINT REPLACEMENT Bilateral 2014   Bilateral Knee replacement   LEAD REVISION/REPAIR N/A 08/29/2017   Procedure: LEAD REVISION/REPAIR;  Surgeon: Regan Lemming, MD;  Location: [facility] INVASIVE CV LAB;  Service: Cardiovascular;  Laterality: N/A;   OOPHORECTOMY     PACEMAKER INSERTION Left 07/01/2015   Procedure: INSERTION PACEMAKER;  Surgeon: Marcina Millard, MD;  Location: [facility] ORS;  Service: Cardiovascular;  Laterality: Left;   PACEMAKER REVISION N/A 08/28/2017    Procedure: PACEMAKER REVISION;  Surgeon: Duke Salvia, MD;  Location: [facility] INVASIVE CV LAB;  Service: Cardiovascular;  Laterality: N/A;   REPLACEMENT TOTAL KNEE BILATERAL     SHOULDER ARTHROSCOPY WITH ROTATOR CUFF REPAIR AND OPEN BICEPS TENODESIS Left 11/21/2019   Procedure: LEFT SHOULDER ARTHROSCOPY WITH DEBRIDEMENT, DECOMPRESSION, ROTATOR CUFF REPAIR AND BICEPS TENOLYSIS;  Surgeon: Christena Flake, MD;  Location: [facility] ORS;  Service: Orthopedics;  Laterality: Left;   SHOULDER ARTHROSCOPY WITH SUBACROMIAL DECOMPRESSION Left 2013   TEE WITHOUT CARDIOVERSION N/A 06/28/2017   Procedure: TRANSESOPHAGEAL ECHOCARDIOGRAM (TEE);  Surgeon: Lamar Blinks, MD;  Location: [facility] ORS;  Service: Cardiovascular;  Laterality: N/A;    Current Outpatient Medications  Medication Instructions   acetaminophen (TYLENOL) 650 mg, Oral, Every 6 hours PRN   albuterol (VENTOLIN HFA) 108 (90 Base) MCG/ACT inhaler 2 puffs, Inhalation, Every 6 hours PRN, Ventolin   allopurinol (ZYLOPRIM) 100 mg, Oral, Daily   alum & mag hydroxide-simeth (MAALOX/MYLANTA) 200-200-20 MG/5ML suspension 10 mLs, Oral, 3 times daily PRN   amiodarone (PACERONE) 200 MG tablet TAKE 1 TABLET BY MOUTH ONCE DAILY . APPOINTMENT REQUIRED FOR FUTURE REFILLS   apixaban (ELIQUIS) 2.5 mg, Oral, 2 times daily   Cholecalciferol (VITAMIN D) 50 MCG (2000 UT) CAPS Oral, Daily   cyanocobalamin (VITAMIN B12) 1,000 mcg, Oral, Daily   docusate sodium (COLACE) 100 mg, Oral, 2 times daily   estradiol (ESTRACE) 0.1 MG/GM vaginal cream 1 Applicatorful, Vaginal, 3 times weekly   famotidine (PEPCID) 10 mg, Oral, Daily   Fluticasone Furoate (FLONASE SENSIMIST NA) 1 spray, Nasal, Daily   folic acid (FOLVITE) 1 mg, Oral, Daily   furosemide (LASIX) 20 mg, Oral, Daily   gabapentin (NEURONTIN) 300 mg, Oral, 3 times daily   HumaLOG 4 Units, Subcutaneous, 3 times daily with meals, Sliding scale   ibandronate (BONIVA) 150 mg, Oral, Every 30 days, Take in the morning with a  full glass of water, on an empty stomach, and do not take anything else by mouth or lie down for the next 30 min.   Lantus 9 Units, Subcutaneous, Daily at bedtime   Levothyroxine Sodium 37.5 mcg, Oral, Daily before breakfast   methocarbamol (ROBAXIN) 500 mg, Oral, Every 6 hours PRN   midodrine (PROAMATINE) 5 mg, Oral, 3 times daily with meals   Multiple Vitamin (MULTIVITAMIN WITH MINERALS) TABS tablet 1 tablet, Oral, Daily   Polyethyl Glycol-Propyl Glycol 0.4-0.3 % SOLN 1 drop, Both Eyes, 2 times daily PRN   polyethylene glycol (MIRALAX / GLYCOLAX) 17 g, Oral, Daily   rOPINIRole (REQUIP) 0.25 mg, Oral, Daily at bedtime   senna (SENOKOT) 8.6 MG tablet 1 tablet, Oral, Daily   sertraline (ZOLOFT) 100 mg, Oral, Daily at bedtime   thiamine (VITAMIN B-1) 100 mg, Oral,  Daily   traMADol (ULTRAM) 50 mg, Oral, Every 6 hours PRN   traZODone (DESYREL) 50 mg, Oral, At bedtime PRN    Social History:  The patient  reports that she has never smoked. She has never used smokeless tobacco. She reports that she does not drink alcohol and does not use drugs.   Family History:  The patient's family history includes CAD in her mother; Cancer in her father; Diabetes Mellitus II in her brother and mother; Heart attack in her mother; Stroke in her brother.  ROS:  Please see the history of present illness. All other systems are reviewed and otherwise negative.   PHYSICAL EXAM:  VS:  BP 110/60 (BP Location: Right Arm, Patient Position: Sitting, Cuff Size: Normal)   Pulse 64   Ht 5\' 3"  (1.6 m)   Wt 157 lb 6 oz (71.4 kg)   SpO2 99%   BMI 27.88 kg/m  BMI: Body mass index is 27.88 kg/m.  GEN- The patient is well appearing, alert and oriented x 3 today.   Lungs- Clear to ausculation bilaterally, normal work of breathing.  Heart- Regular rate and rhythm, no murmurs, rubs or gallops Extremities- No peripheral edema, warm, dry Skin-   device pocket well-healed   Device interrogation done today and reviewed by  myself:  Battery 6.2 years Lead thresholds, impedence, sensing stable  Rare, Afib episodes, none since May 2024 Histograms appropriate for activity No changes made today  EKG is ordered. Personal review of EKG from today shows: A-paced, 1st deg HB, rate 64bpm  Recent Labs: 01/21/2023: B Natriuretic Peptide 1,487.0; TSH 4.492 01/25/2023: ALT 12; BUN 45; Creatinine, Ser 1.39; Magnesium 1.9; Potassium 4.0; Sodium 138 01/31/2023: Hemoglobin 9.1; Platelets 218  No results found for requested labs within last 365 days.   CrCl cannot be calculated (Patient's most recent lab result is older than the maximum 21 days allowed.).   Wt Readings from Last 3 Encounters:  07/03/23 157 lb 6 oz (71.4 kg)  04/22/23 148 lb (67.1 kg)  02/01/23 169 lb 1.5 oz (76.7 kg)     Additional studies reviewed include: Previous EP, cardiology notes.   TTE, 01/22/2023  1. Left ventricular ejection fraction, by estimation, is 60 to 65%. Left ventricular ejection fraction by PLAX is 63 %. The left ventricle has normal function. The left ventricle has no regional wall motion abnormalities. Left ventricular diastolic parameters are indeterminate.   2. Right ventricular systolic function is low normal. The right ventricular size is mildly enlarged. There is moderately elevated pulmonary artery systolic pressure. The estimated right ventricular systolic pressure is 51.2 mmHg.   3. The mitral valve is normal in structure. Mild mitral valve regurgitation. No evidence of mitral stenosis.   4. Tricuspid valve regurgitation is moderate to severe.   5. The aortic valve is tricuspid. Aortic valve regurgitation is not visualized. No aortic stenosis is present.   6. The inferior vena cava is normal in size with greater than 50% respiratory variability, suggesting right atrial pressure of 3 mmHg.   TTE, 04/10/2018 - Left ventricle: The cavity size was normal. Wall thickness was normal. Systolic function was normal. The estimated  ejection fraction was in the range of 60% to 65%.  - Mitral valve: Moderately thickened, mildly calcified leaflets .  - Left atrium: The atrium was mildly dilated.  - Right ventricle: The cavity size was mildly to moderately dilated. Wall thickness was mildly increased.  - Right atrium: The atrium was mildly dilated.  - Tricuspid valve:  There was moderate regurgitation.  - Pulmonary arteries: Systolic pressure was moderately increased,    estimated to be 50 mm Hg plus central venous/right atrial pressure.   ASSESSMENT AND PLAN:  #) SND s/p MDT dual chamber PPM Device functioning well, see paceart  #) parox Afib #) high-risk medication use - amiodarone Tolerating amiodarone 200mg  daily Stable afib burden via device, no significant symptoms during episodes.  - update amio labs today including CMP, TSH, T4  #) Hypercoag d/t parox afib CHA2DS2-VASc Score = 8 [CHF History: 0, HTN History: 1, Diabetes History: 1, Stroke History: 2, Vascular Disease History: 1, Age Score: 2, Gender Score: 1].  Therefore, the patient's annual risk of stroke is 10.8 %.  OAC - eliquis 2.5mg  daily, no bleeding concerns - updating labs as above to confirm dose reduction Continue to monitor risk/benefit of OAC with ongoing falls Encouraged safety awareness - using walker Consider PT for gait instability  #) HFpEF Euvolemic on exam Warm and dry NYHA class II symptoms   Current medicines are reviewed at length with the patient today.   The patient does not have concerns regarding her medicines.  The following changes were made today:  none  Labs/ tests ordered today include:  Orders Placed This Encounter  Procedures   CBC   Comprehensive metabolic panel   TSH   T4   EKG 12-Lead     Disposition: Follow up with Dr. Graciela Husbands or  EP APP in 6 months   Signed, Sherie Don, NP  07/03/23  10:54 AM  Electrophysiology [facility]

## 2023-07-03 ENCOUNTER — Encounter: Payer: Self-pay | Admitting: Cardiology

## 2023-07-03 ENCOUNTER — Ambulatory Visit: Payer: Medicare Other | Attending: Cardiology | Admitting: Cardiology

## 2023-07-03 VITALS — BP 110/60 | HR 64 | Ht 63.0 in | Wt 157.4 lb

## 2023-07-03 DIAGNOSIS — R296 Repeated falls: Secondary | ICD-10-CM

## 2023-07-03 DIAGNOSIS — D6869 Other thrombophilia: Secondary | ICD-10-CM

## 2023-07-03 DIAGNOSIS — I495 Sick sinus syndrome: Secondary | ICD-10-CM

## 2023-07-03 DIAGNOSIS — I480 Paroxysmal atrial fibrillation: Secondary | ICD-10-CM

## 2023-07-03 DIAGNOSIS — Z79899 Other long term (current) drug therapy: Secondary | ICD-10-CM | POA: Diagnosis not present

## 2023-07-03 DIAGNOSIS — Z950 Presence of cardiac pacemaker: Secondary | ICD-10-CM

## 2023-07-03 LAB — CUP PACEART INCLINIC DEVICE CHECK
Date Time Interrogation Session: 20241021113622
Implantable Lead Connection Status: 753985
Implantable Lead Connection Status: 753985
Implantable Lead Implant Date: 20161019
Implantable Lead Implant Date: 20181217
Implantable Lead Location: 753859
Implantable Lead Location: 753860
Implantable Lead Model: 5076
Implantable Lead Model: 5076
Implantable Pulse Generator Implant Date: 20181217

## 2023-07-03 NOTE — Patient Instructions (Signed)
Medication Instructions:  Your Physician recommend you continue on your current medication as directed.    *If you need a refill on your cardiac medications before your next appointment, please call your pharmacy*   Lab Work: Your provider would like for you to have following labs drawn today CBC, CMET, TSH, and T4.   If you have labs (blood work) drawn today and your tests are completely normal, you will receive your results only by: MyChart Message (if you have MyChart) OR A paper copy in the mail If you have any lab test that is abnormal or we need to change your treatment, we will call you to review the results.   Follow-Up: At [facility], you and your health needs are our priority.  As part of our continuing mission to provide you with exceptional heart care, we have created designated Provider Care Teams.  These Care Teams include your primary Cardiologist (physician) and Advanced Practice Providers (APPs -  Physician Assistants and Nurse Practitioners) who all work together to provide you with the care you need, when you need it.  We recommend signing up for the patient portal called "MyChart".  Sign up information is provided on this After Visit Summary.  MyChart is used to connect with patients for Virtual Visits (Telemedicine).  Patients are able to view lab/test results, encounter notes, upcoming appointments, etc.  Non-urgent messages can be sent to your provider as well.   To learn more about what you can do with MyChart, go to [email or web address].    Your next appointment:   6 month(s)  Provider:   You may see Sherryl Manges, MD or Sherie Don, NP

## 2023-07-04 ENCOUNTER — Encounter: Payer: Medicare Other | Admitting: Internal Medicine

## 2023-07-04 LAB — COMPREHENSIVE METABOLIC PANEL
ALT: 35 [IU]/L — ABNORMAL HIGH (ref 0–32)
AST: 48 [IU]/L — ABNORMAL HIGH (ref 0–40)
Albumin: 4.4 g/dL (ref 3.7–4.7)
Alkaline Phosphatase: 261 [IU]/L — ABNORMAL HIGH (ref 44–121)
BUN/Creatinine Ratio: 33 — ABNORMAL HIGH (ref 12–28)
BUN: 50 mg/dL — ABNORMAL HIGH (ref 8–27)
Bilirubin Total: 0.5 mg/dL (ref 0.0–1.2)
CO2: 24 mmol/L (ref 20–29)
Calcium: 9.3 mg/dL (ref 8.7–10.3)
Chloride: 101 mmol/L (ref 96–106)
Creatinine, Ser: 1.5 mg/dL — ABNORMAL HIGH (ref 0.57–1.00)
Globulin, Total: 2.7 g/dL (ref 1.5–4.5)
Glucose: 308 mg/dL — ABNORMAL HIGH (ref 70–99)
Potassium: 5.2 mmol/L (ref 3.5–5.2)
Sodium: 137 mmol/L (ref 134–144)
Total Protein: 7.1 g/dL (ref 6.0–8.5)
eGFR: 35 mL/min/{1.73_m2} — ABNORMAL LOW (ref >59)

## 2023-07-04 LAB — CBC
Hematocrit: 39.9 % (ref 34.0–46.6)
Hemoglobin: 12.3 g/dL (ref 11.1–15.9)
MCH: 31.7 pg (ref 26.6–33.0)
MCHC: 30.8 g/dL — ABNORMAL LOW (ref 31.5–35.7)
MCV: 103 fL — ABNORMAL HIGH (ref 79–97)
Platelets: 163 10*3/uL (ref 150–450)
RBC: 3.88 x10E6/uL (ref 3.77–5.28)
RDW: 11.8 % (ref 11.7–15.4)
WBC: 4 10*3/uL (ref 3.4–10.8)

## 2023-07-04 LAB — T4: T4, Total: 7.3 ug/dL (ref 4.5–12.0)

## 2023-07-04 LAB — TSH: TSH: 2.89 u[IU]/mL (ref 0.450–4.500)

## 2023-07-11 ENCOUNTER — Other Ambulatory Visit: Payer: Self-pay

## 2023-07-11 DIAGNOSIS — I480 Paroxysmal atrial fibrillation: Secondary | ICD-10-CM

## 2023-07-11 MED ORDER — AMIODARONE HCL 200 MG PO TABS: 100.0000 mg | ORAL_TABLET | Freq: Every day | ORAL | Status: DC

## 2023-09-08 ENCOUNTER — Ambulatory Visit (HOSPITAL_COMMUNITY): Payer: Medicare Other

## 2023-09-11 ENCOUNTER — Ambulatory Visit (INDEPENDENT_AMBULATORY_CARE_PROVIDER_SITE_OTHER): Payer: Medicaid Other

## 2023-09-11 DIAGNOSIS — I480 Paroxysmal atrial fibrillation: Secondary | ICD-10-CM | POA: Diagnosis not present

## 2023-09-22 ENCOUNTER — Telehealth: Payer: Self-pay

## 2023-09-22 NOTE — Telephone Encounter (Signed)
   Pre-operative Risk Assessment    Patient Name: Deborah Obrien  DOB: April 16, 1941 MRN: [id number]   Date of last office visit: 07/03/23 Date of next office visit: none   Request for Surgical Clearance    Procedure:  Right hipp removal of nail hardware with scree exchange   Date of Surgery:  Clearance 10/17/23                                 Surgeon:  Dr. Earnestine Blanch  Surgeon's Group or Practice Name:  [facility]  Phone number:  [phone] Fax number:  [phone]   Type of Clearance Requested:   - Pharmacy:  Hold Apixaban  (Eliquis )     Type of Anesthesia:  Not Indicated   Additional requests/questions:    Bonney Connye GORMAN Rosalva   09/22/2023, 9:57 AM

## 2023-09-22 NOTE — Telephone Encounter (Signed)
 Patient with diagnosis of A Fib on Eliquis  for anticoagulation.    Procedure: Right hip removal of nail hardware   Date of procedure: 10/17/23   CHA2DS2-VASc Score = 9  This indicates a 12.2% annual risk of stroke. The patient's score is based upon: CHF History: 1 HTN History: 1 Diabetes History: 1 Stroke History: 2 Vascular Disease History: 1 Age Score: 2 Gender Score: 1    CrCl 32 ml/min Platelet count 163K  Will need to hold Eliquis  3 days for procedure. Will route to Dr Fernande to see if a bridge is needed due to history of TIA  **This guidance is not considered finalized until pre-operative APP has relayed final recommendations.**

## 2023-10-02 ENCOUNTER — Other Ambulatory Visit: Payer: Self-pay | Admitting: Orthopedic Surgery

## 2023-10-02 NOTE — Telephone Encounter (Signed)
My brief review   1) bridging higher bleeding without lower risk of TE ( ORBIT) 2) [place]>> no change in outcomes I think I would go with your recommendation Hold anticoagulation the 2 days prior to her surgery Thanks SK

## 2023-10-03 NOTE — Telephone Encounter (Signed)
   Patient Name: Deborah Obrien  DOB: Jun 27, 1941 MRN: [id number]  Primary Cardiologist: Sherryl Manges, MD  Clinical pharmacists have reviewed the patient's past medical history, labs, and current medications as part of preoperative protocol coverage. The following recommendations have been made:   Patient with diagnosis of A Fib on Eliquis for anticoagulation.     Procedure: Right hip removal of nail hardware   Date of procedure: 10/17/23     CHA2DS2-VASc Score = 9  This indicates a 12.2% annual risk of stroke. The patient's score is based upon: CHF History: 1 HTN History: 1 Diabetes History: 1 Stroke History: 2 Vascular Disease History: 1 Age Score: 2 Gender Score: 1     CrCl 32 ml/min Platelet count 163K      Per Dr Graciela Husbands, ok to hold Eliquis 2 days prior to procedure. Please resume Eliquis as soon as possible postprocedure, at the discretion of the surgeon.   I will route this recommendation to the requesting party via Epic fax function and remove from pre-op pool.  Please call with questions.  Joylene Grapes, NP 10/03/2023, 10:07 AM

## 2023-10-10 ENCOUNTER — Encounter: Payer: Self-pay | Admitting: Internal Medicine

## 2023-10-10 ENCOUNTER — Other Ambulatory Visit: Payer: Self-pay

## 2023-10-10 ENCOUNTER — Encounter
Admission: RE | Admit: 2023-10-10 | Discharge: 2023-10-10 | Disposition: A | Discharge status: Home / Self Care | Payer: 59 | Source: Ambulatory Visit | Attending: Orthopedic Surgery | Admitting: Orthopedic Surgery

## 2023-10-10 VITALS — BP 133/68 | HR 83 | Temp 98.0°F | Resp 20 | Ht 63.0 in | Wt 155.4 lb

## 2023-10-10 DIAGNOSIS — M19012 Primary osteoarthritis, left shoulder: Secondary | ICD-10-CM

## 2023-10-10 DIAGNOSIS — E1122 Type 2 diabetes mellitus with diabetic chronic kidney disease: Secondary | ICD-10-CM | POA: Insufficient documentation

## 2023-10-10 DIAGNOSIS — N184 Chronic kidney disease, stage 4 (severe): Secondary | ICD-10-CM | POA: Diagnosis not present

## 2023-10-10 DIAGNOSIS — Z794 Long term (current) use of insulin: Secondary | ICD-10-CM | POA: Diagnosis not present

## 2023-10-10 DIAGNOSIS — Z01818 Encounter for other preprocedural examination: Secondary | ICD-10-CM | POA: Diagnosis present

## 2023-10-10 DIAGNOSIS — Z22322 Carrier or suspected carrier of Methicillin resistant Staphylococcus aureus: Secondary | ICD-10-CM

## 2023-10-10 DIAGNOSIS — Z01812 Encounter for preprocedural laboratory examination: Secondary | ICD-10-CM

## 2023-10-10 DIAGNOSIS — R799 Abnormal finding of blood chemistry, unspecified: Secondary | ICD-10-CM | POA: Insufficient documentation

## 2023-10-10 HISTORY — DX: Carrier or suspected carrier of methicillin resistant Staphylococcus aureus: Z22.322

## 2023-10-10 HISTORY — DX: Displaced intertrochanteric fracture of right femur, initial encounter for closed fracture: S72.141A

## 2023-10-10 HISTORY — DX: Vascular dementia, moderate, with anxiety: F01.B4

## 2023-10-10 HISTORY — DX: Hypertensive heart and chronic kidney disease with heart failure and stage 1 through stage 4 chronic kidney disease, or unspecified chronic kidney disease: I13.0

## 2023-10-10 HISTORY — DX: Type 2 diabetes mellitus with diabetic neuropathy, unspecified: E11.40

## 2023-10-10 LAB — CBC WITH DIFFERENTIAL/PLATELET
Abs Immature Granulocytes: 0.01 10*3/uL (ref 0.00–0.07)
Basophils Absolute: 0 10*3/uL (ref 0.0–0.1)
Basophils Relative: 1 %
Eosinophils Absolute: 0.1 10*3/uL (ref 0.0–0.5)
Eosinophils Relative: 1 %
HCT: 38.3 % (ref 36.0–46.0)
Hemoglobin: 12.7 g/dL (ref 12.0–15.0)
Immature Granulocytes: 0 %
Lymphocytes Relative: 43 %
Lymphs Abs: 2.1 10*3/uL (ref 0.7–4.0)
MCH: 31.8 pg (ref 26.0–34.0)
MCHC: 33.2 g/dL (ref 30.0–36.0)
MCV: 95.8 fL (ref 80.0–100.0)
Monocytes Absolute: 0.3 10*3/uL (ref 0.1–1.0)
Monocytes Relative: 6 %
Neutro Abs: 2.4 10*3/uL (ref 1.7–7.7)
Neutrophils Relative %: 49 %
Platelets: 158 10*3/uL (ref 150–400)
RBC: 4 MIL/uL (ref 3.87–5.11)
RDW: 13.2 % (ref 11.5–15.5)
WBC: 4.9 10*3/uL (ref 4.0–10.5)
nRBC: 0 % (ref 0.0–0.2)

## 2023-10-10 LAB — URINALYSIS, ROUTINE W REFLEX MICROSCOPIC
Bilirubin Urine: NEGATIVE
Glucose, UA: NEGATIVE mg/dL
Hgb urine dipstick: NEGATIVE
Ketones, ur: NEGATIVE mg/dL
Leukocytes,Ua: NEGATIVE
Nitrite: NEGATIVE
Protein, ur: NEGATIVE mg/dL
Specific Gravity, Urine: 1.009 (ref 1.005–1.030)
pH: 6 (ref 5.0–8.0)

## 2023-10-10 LAB — COMPREHENSIVE METABOLIC PANEL
ALT: 22 U/L (ref 0–44)
AST: 36 U/L (ref 15–41)
Albumin: 4.1 g/dL (ref 3.5–5.0)
Alkaline Phosphatase: 110 U/L (ref 38–126)
Anion gap: 16 — ABNORMAL HIGH (ref 5–15)
BUN: 46 mg/dL — ABNORMAL HIGH (ref 8–23)
CO2: 24 mmol/L (ref 22–32)
Calcium: 9.5 mg/dL (ref 8.9–10.3)
Chloride: 100 mmol/L (ref 98–111)
Creatinine, Ser: 1.48 mg/dL — ABNORMAL HIGH (ref 0.44–1.00)
GFR, Estimated: 35 mL/min — ABNORMAL LOW (ref >60)
Glucose, Bld: 129 mg/dL — ABNORMAL HIGH (ref 70–99)
Potassium: 4 mmol/L (ref 3.5–5.1)
Sodium: 140 mmol/L (ref 135–145)
Total Bilirubin: 1.2 mg/dL (ref 0.0–1.2)
Total Protein: 7.5 g/dL (ref 6.5–8.1)

## 2023-10-10 LAB — HEMOGLOBIN A1C
Hgb A1c MFr Bld: 9.2 % — ABNORMAL HIGH (ref 4.8–5.6)
Mean Plasma Glucose: 217.34 mg/dL

## 2023-10-10 LAB — TYPE AND SCREEN
ABO/RH(D): A POS
Antibody Screen: NEGATIVE

## 2023-10-10 LAB — SURGICAL PCR SCREEN
MRSA, PCR: POSITIVE — AB
Staphylococcus aureus: POSITIVE — AB

## 2023-10-10 MED ORDER — MUPIROCIN 2 % EX OINT: TOPICAL_OINTMENT | CUTANEOUS | 0 refills | Status: AC

## 2023-10-10 NOTE — Progress Notes (Addendum)
Perioperative Services Pre-Admission/Anesthesia Testing   Date: 10/10/23 Name: [name] MRN:   [id number]  Re: Consideration of preoperative prophylactic antibiotic change   Request sent to: Signa Kell, MD (routed and/or faxed via [facility])  Planned Surgical Procedure(s):    Case: [id number] Date/Time: 10/17/23 1443   Procedure: Right hip removal of nail hardware with screw exchange (Right)   Anesthesia type: Choice   Pre-op diagnosis: Pain from implanted hardware, subsequent encounter T85.848D   Location: [facility] OR ROOM 01 / [facility]   Surgeons: Signa Kell, MD      Clinical Notes:  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   EMR review indicated that patient received PCN and/or cephalosporin in the past as follows: CEFAZOLIN received on 06/02/2022 and 01/21/2023 with no documented ADRs. Allergy section in EMR updated to reflect tolerance.    Screened as appropriate for cephalosporin use during medication reconciliation No immediate angioedema, dysphagia, SOB, anaphylaxis symptoms. No severe rash involving mucous membranes or skin necrosis. No hospital admissions related to side effects of PCN/cephalosporin use.  No documented reaction to PCN or cephalosporin in the last 10 years.  Request:  As an evidence based approach to reducing the rate of incidence for post-operative SSI and the development of MDROs, could an agent that allows for narrower antimicrobial coverage for preoperative prophylaxis in this patient's upcoming surgical course be considered?   Currently ordered preoperative prophylactic ABX: LEVOFLOXACIN + VANCOMYCIN  Specifically requesting change to LEVOFLOXACIN to 1st generation cephalosporin (CEFAZOLIN). Of added concern due to the fact that patient is on amiodarone. Concomitant use of amiodarone + levofloxacin may result  in the additive effects on the QT interval, and thereby increase the risk of cardiac adverse events (arrhythmia and torsade de pointes).  Drug of choice for many procedures; it is the most widely studied antimicrobial agent with proven efficacy for antimicrobial prophylaxis.   Desirable duration of action, spectrum of activity against organisms commonly encountered in surgery, and it has an excellent safety profile and low cost.   Active against streptococci, methicillin-susceptible staphylococci, and many gram-negative organisms.  Despite being a first-generation cephalosporin, cefazolin is structurally different than other cephalosporins. Cefazolin has two side chains (R1 and R2) that have structures that do not match those of any other FDA-approved ?-lactams or PCN.  True allergies to cefazolin are extremely rare (<1%) and are usually specific for its side chains, not the shared core ring.  No documented history of true IgE mediated reaction in this patient.   Please communicate decision with me and I will change the orders in Epic as per your direction. Note, patient will need the VANCOMYCIN, as her presurgical PCR resulted as (+) for MRSA.    Things to consider: Many patients report that they were "allergic" to PCN earlier in life, however this does not translate into a true lifelong allergy. Patients can lose sensitivity to specific IgE antibodies over time if PCN is avoided (Kleris & Lugar, 2019).  Penicillin allergies are reported by 8% to 15% of the [place] population, but up to 95% of these allergies do not correspond to a true allergy when tested Cathren Laine et al., 2022).   Cross-sensitivity between PCN and cephalosporins has been documented as being as high as 10%, however this estimation included data believed to have been collected in a setting where there was contamination. Newer data suggests that the prevalence of  cross-sensitivity between PCN and cephalosporins is actually estimated to  be closer to 1% (Hermanides et al., 2018).   Patients labeled as PCN allergic, whether they are truly allergic or not, have been found to have inferior outcomes in terms of rates of serious infection, and these patients tend to have longer hospital stays [facility] & Lugar, 2019).  Treatment related secondary infections, such as Clostridioides difficile, have been linked to the improper use of broad spectrum antibiotics in patients improperly labeled as PCN allergic (Kleris & Lugar, 2019).  Anaphylaxis from cephalosporins is rare and the evidence suggests that there is no increased risk of an anaphylactic type reaction when cephalosporins are used in a PCN allergic patient (Pichichero, 2006).  Citations: Hermanides J, Lemkes BA, Prins Gwenyth Bender MW, Terreehorst I. Presumed ?-Lactam Allergy and Cross-reactivity in the Operating Theater: A Practical Approach. Anesthesiology. 2018 Aug;129(2):335-342. doi: 10.1097/ALN.0000000000002252. PMID: 98119147.  Kleris, R. S., & Lugar, P. L. (2019). Things We Do For No Reason: Failing to Question a Penicillin Allergy History. Journal of hospital medicine, 14(10), 5403912337. Advance online publication. airportbarriers.com  Pichichero, M. E. (2006). Cephalosporins can be prescribed safely for penicillin-allergic patients. Journal of family medicine, 55(2), 106-112. Accessed: https://cdn.mdedge.com/files/s57fs-public/Document/September-2017/5502JFP_AppliedEvidence1.pdf  Fransisco Beau., & Senaida Ores, D. R. (2022). Understanding Penicillin Allergy, Cross-reactivity, and Antibiotic Selection in the Preoperative Setting. The Journal of the American Academy of Orthopaedic Surgeons, 30(1), e1-e5. CallRank.tn   Quentin Mulling, MSN, APRN, FNP-C, CEN [facility]  Perioperative Services Nurse Practitioner Phone: [phone] Fax: [phone] 10/10/23 6:54 PM  NOTE: This note has been  prepared using Dragon dictation software. Despite my best ability to proofread, there is always the potential that unintentional transcriptional errors may still occur from this process.

## 2023-10-10 NOTE — Progress Notes (Signed)
PERIOPERATIVE PRESCRIPTION FOR IMPLANTED CARDIAC DEVICE PROGRAMMING  Patient Information: Name:  Deborah Obrien  DOB:  December 11, 1940  MRN:  [id number]    Planned Procedure: Right hip removal of nail hardware with screw exchange  Surgeon:  Dr. Signa Kell, MD  Requesting device clearance: Quentin Mulling, FNP-C  Date of Procedure:  10/17/2023  Cautery will be used.   Please route documentation back me via [facility], or may fax report to [facility] PAT APP at [phone].   Device Information:  Clinic EP Physician:  Sherryl Manges, MD   Device Type:  Pacemaker Manufacturer and Phone #:  Medtronic: [phone] Pacemaker Dependent?:  Yes.   Date of Last Device Check:  09/11/2023 Normal Device Function?:  Yes.    Electrophysiologist's Recommendations:  Have magnet available. Provide continuous ECG monitoring when magnet is used or reprogramming is to be performed.  Procedure should not interfere with device function.  No device programming or magnet placement needed.  Per Device Clinic Standing Orders, Lenor Coffin, RN  2:03 PM 10/10/2023

## 2023-10-10 NOTE — Progress Notes (Signed)
  Perioperative Services  Abnormal Lab Notification and Treatment Plan of Care  Date: 10/10/23  Name: Deborah Obrien MRN:   [id number]  Re: Abnormal labs noted during PAT appointment  Provider Notified: Signa Kell, MD [facility])     Smiley Houseman, FNP-C ([facility]) Notification mode: Routed and/or faxed via [facility]  Labs of concern: Lab Results  Component Value Date   STAPHAUREUS POSITIVE (A) 10/10/2023   MRSAPCR POSITIVE (A) 10/10/2023    Notes: Patient is scheduled for an elective REMOVAL OF NAIL HARDWARE WITH SCREW EXCHANGE (RIGHT HIP) on 10/17/2023. She is scheduled to receive LEVOFLOXACIN + VANCOMYCIN pre-operatively. Pre-surgical PCR (+) for MRSA; see above.  PLANS:  Review renal function. Estimated Creatinine Clearance: 27.1 mL/min (A) (by C-G formula based on SCr of 1.48 mg/dL (H)).  Review allergies. No documented allergy to vancomycin. Aware of cephalosporin intolerance. Patient has tolerated 1st generation cephalosporin with her last two surgeries in 2023 and 2024. I have updated her allergy information in her EMR to reflect this information for future reference/provider review going forward.  I have communicated with primary attending surgeon regarding therapeutic change in therapy. Again, patient has tolerated in the past. Of additional concern is the fact that patient is on amiodarone. Concomitant use of amiodarone + levofloxacin may result in the additive effects on the QT interval, and thereby increase the risk of cardiac adverse events (arrhythmia and torsade de pointes). Dr. Allena Katz is fine with changing the LEVOFLOXACIN to CEFAZOLIN. Orders for preoperative antimicrobial coverage amended. Verified that order for VANCOMYCIN 1 GRAM IV  is in [facility] as part of patient's preoperative prophylactic regimen.   Patient with orders for both CEFAZOLIN + VANCOMYCIN to be given in the setting of documented MRSA (+) surgical  PCR.    Guidelines suggest that a beta-lactam antibiotic (first or second generation cephalosporin) should be added for activity against gram-negative organisms.  Vancomycin appears to be less effective than cefazolin for preventing SSIs caused by MSSA. For this reason, the use of vancomycin in combination with cefazolin is favored for prevention of SSI due to MRSA and coagulase-negative staphylococci.  Medical history in [facility] updated to reflect today's (+) PCR result indicating nasal MRSA colonization   Additional preoperative nasal decolonization to be attempted preoperatively. Patient is in a local ALF. Rx has been sent to corporate pharmacy used by the facility. I have communicated with primary care team member. I will send a copy of this note to assisted living facility to make them aware of (+) results and plans for perioperative management.   NOTE TO ALF: the only action required for patient at the facility is the use of the nasal mupirocin as prescribed. The intravenous antimicrobial agents will be administered here at [facility] on the day of the patient's surgery. I have discussed the above information with Smiley Houseman, FNP-C; she will let Melonie Florida, NP know as well.   Meds ordered this encounter  Medications   mupirocin ointment (BACTROBAN) 2 %    Sig: Apply small amount to the inside of both nostrils TWICE a day for the next 5 days.    Dispense:  15 g    Refill:  0    Needs to start ASAP for preoperative nasal decolonization following (+) SA/MRSA results on PCR testing.   Quentin Mulling, MSN, APRN, FNP-C, CEN [facility]  Perioperative Services Nurse Practitioner Phone: [phone] 10/10/23 7:12 PM

## 2023-10-10 NOTE — Patient Instructions (Addendum)
Your procedure is scheduled on: Tuesday 10/17/23  Report to the Registration Desk on the 1st floor of the [facility]. To find out your arrival time, please call [phone] between 1PM - 3PM on: Monday 10/16/23  If your arrival time is 6:00 am, do not arrive before that time as the [facility] entrance doors do not open until 6:00 am.  REMEMBER: Instructions that are not followed completely may result in serious medical risk, up to and including death; or upon the discretion of your surgeon and anesthesiologist your surgery may need to be rescheduled.  Do not eat food after midnight the night before surgery.  No gum chewing or hard candies.  You may however, drink CLEAR liquids up to 2 hours before you are scheduled to arrive for your surgery. Do not drink anything within 2 hours of your scheduled arrival time.  Clear liquids include: - water   In addition, your doctor has ordered for you to drink the provided:  Gatorade G2 Drinking this carbohydrate drink up to two hours before surgery helps to reduce insulin resistance and improve patient outcomes. Please complete drinking 2 hours before scheduled arrival time.  One week prior to surgery: Stop Anti-inflammatories (NSAIDS) such as Advil, Aleve, Ibuprofen, Motrin, Naproxen, Naprosyn and Aspirin based products such as Excedrin, Goody's Powder, BC Powder. Stop ANY OVER THE COUNTER supplements until after surgery.  You may however, continue to take Tylenol if needed for pain up until the day of surgery.  Take half of your normal nightly dose of LANTUS 100 UNIT/ML injection the night before your surgery. No insulin the morning of your surgery.   Stop apixaban (ELIQUIS) 2.5 MG TABS 3 days prior to surgery as instructed by your cardiologist. (Take last dose Friday 10/13/23)  Continue taking all of your other prescription medications up until the day of surgery.  ON THE DAY OF SURGERY ONLY TAKE THESE MEDICATIONS WITH SIPS OF  WATER:  allopurinol (ZYLOPRIM) 100 MG  amiodarone (PACERONE) 200 MG  famotidine (PEPCID) 10 MG  gabapentin (NEURONTIN) 300 MG  levothyroxine (SYNTHROID) 75 MCG  sertraline (ZOLOFT) 100 MG    No Alcohol for 24 hours before or after surgery.  No Smoking including e-cigarettes for 24 hours before surgery.  No chewable tobacco products for at least 6 hours before surgery.  No nicotine patches on the day of surgery.  Do not use any "recreational" drugs for at least a week (preferably 2 weeks) before your surgery.  Please be advised that the combination of cocaine and anesthesia may have negative outcomes, up to and including death. If you test positive for cocaine, your surgery will be cancelled.  On the morning of surgery brush your teeth with toothpaste and water, you may rinse your mouth with mouthwash if you wish. Do not swallow any toothpaste or mouthwash.  Use CHG Soap or wipes as directed on instruction sheet.  Do not wear jewelry, make-up, hairpins, clips or nail polish.  For welded (permanent) jewelry: bracelets, anklets, waist bands, etc.  Please have this removed prior to surgery.  If it is not removed, there is a chance that hospital personnel will need to cut it off on the day of surgery.  Do not wear lotions, powders, or perfumes.   Do not shave body hair from the neck down 48 hours before surgery.  Contact lenses, hearing aids and dentures may not be worn into surgery.  Do not bring valuables to the hospital. [facility] is not responsible for any  missing/lost belongings or valuables.   Bring your C-PAP to the hospital in case you may have to spend the night.   Notify your doctor if there is any change in your medical condition (cold, fever, infection).  Wear comfortable clothing (specific to your surgery type) to the hospital.  After surgery, you can help prevent lung complications by doing breathing exercises.  Take deep breaths and cough every 1-2 hours. Your  doctor may order a device called an Incentive Spirometer to help you take deep breaths. When coughing or sneezing, hold a pillow firmly against your incision with both hands. This is called "splinting." Doing this helps protect your incision. It also decreases belly discomfort.  If you are being admitted to the hospital overnight, leave your suitcase in the car. After surgery it may be brought to your room.  In case of increased patient census, it may be necessary for you, the patient, to continue your postoperative care in the Same Day Surgery department.  If you are being discharged the day of surgery, you will not be allowed to drive home. You will need a responsible individual to drive you home and stay with you for 24 hours after surgery.   If you are taking public transportation, you will need to have a responsible individual with you.  Please call the Pre-admissions Testing Dept. at [phone] if you have any questions about these instructions.  Surgery Visitation Policy:  Patients having surgery or a procedure may have two visitors.  Children under the age of 36 must have an adult with them who is not the patient.  Temporary Visitor Restrictions Due to increasing cases of flu, RSV and COVID-19: Children ages 44 and under will not be able to visit patients in [facility] hospitals under most circumstances.  Inpatient Visitation:    Visiting hours are 7 a.m. to 8 p.m. Up to four visitors are allowed at one time in a patient room. The visitors may rotate out with other people during the day.  One visitor age 55 or older may stay with the patient overnight and must be in the room by 8 p.m.     Pre-operative 5 CHG Bath Instructions   You can play a key role in reducing the risk of infection after surgery. Your skin needs to be as free of germs as possible. You can reduce the number of germs on your skin by washing with CHG (chlorhexidine gluconate) soap before surgery. CHG is  an antiseptic soap that kills germs and continues to kill germs even after washing.   DO NOT use if you have an allergy to chlorhexidine/CHG or antibacterial soaps. If your skin becomes reddened or irritated, stop using the CHG and notify one of our RNs at [phone].   Please shower with the CHG soap starting 4 days before surgery using the following schedule:     Please keep in mind the following:  DO NOT shave, including legs and underarms, starting the day of your first shower.   You may shave your face at any point before/day of surgery.  Place clean sheets on your bed the day you start using CHG soap. Use a clean washcloth (not used since being washed) for each shower. DO NOT sleep with pets once you start using the CHG.   CHG Shower Instructions:  If you choose to wash your hair and private area, wash first with your normal shampoo/soap.  After you use shampoo/soap, rinse your hair and body thoroughly to remove  shampoo/soap residue.  Turn the water OFF and apply about 3 tablespoons (45 ml) of CHG soap to a CLEAN washcloth.  Apply CHG soap ONLY FROM YOUR NECK DOWN TO YOUR TOES (washing for 3-5 minutes)  DO NOT use CHG soap on face, private areas, open wounds, or sores.  Pay special attention to the area where your surgery is being performed.  If you are having back surgery, having someone wash your back for you may be helpful. Wait 2 minutes after CHG soap is applied, then you may rinse off the CHG soap.  Pat dry with a clean towel  Put on clean clothes/pajamas   If you choose to wear lotion, please use ONLY the CHG-compatible lotions on the back of this paper.     Additional instructions for the day of surgery: DO NOT APPLY any lotions, deodorants, cologne, or perfumes.   Put on clean/comfortable clothes.  Brush your teeth.  Ask your nurse before applying any prescription medications to the skin.      CHG Compatible Lotions   Aveeno Moisturizing lotion  Cetaphil  Moisturizing Cream  Cetaphil Moisturizing Lotion  Clairol Herbal Essence Moisturizing Lotion, Dry Skin  Clairol Herbal Essence Moisturizing Lotion, Extra Dry Skin  Clairol Herbal Essence Moisturizing Lotion, Normal Skin  Curel Age Defying Therapeutic Moisturizing Lotion with Alpha Hydroxy  Curel Extreme Care Body Lotion  Curel Soothing Hands Moisturizing Hand Lotion  Curel Therapeutic Moisturizing Cream, Fragrance-Free  Curel Therapeutic Moisturizing Lotion, Fragrance-Free  Curel Therapeutic Moisturizing Lotion, Original Formula  Eucerin Daily Replenishing Lotion  Eucerin Dry Skin Therapy Plus Alpha Hydroxy Crme  Eucerin Dry Skin Therapy Plus Alpha Hydroxy Lotion  Eucerin Original Crme  Eucerin Original Lotion  Eucerin Plus Crme Eucerin Plus Lotion  Eucerin TriLipid Replenishing Lotion  Keri Anti-Bacterial Hand Lotion  Keri Deep Conditioning Original Lotion Dry Skin Formula Softly Scented  Keri Deep Conditioning Original Lotion, Fragrance Free Sensitive Skin Formula  Keri Lotion Fast Absorbing Fragrance Free Sensitive Skin Formula  Keri Lotion Fast Absorbing Softly Scented Dry Skin Formula  Keri Original Lotion  Keri Skin Renewal Lotion Keri Silky Smooth Lotion  Keri Silky Smooth Sensitive Skin Lotion  Nivea Body Creamy Conditioning Oil  Nivea Body Extra Enriched Lotion  Nivea Body Original Lotion  Nivea Body Sheer Moisturizing Lotion Nivea Crme  Nivea Skin Firming Lotion  NutraDerm 30 Skin Lotion  NutraDerm Skin Lotion  NutraDerm Therapeutic Skin Cream  NutraDerm Therapeutic Skin Lotion  ProShield Protective Hand Cream  Provon moisturizing lotionCmo utilizar un espirmetro de incentivo    How to Use an Facilities manager  An incentive spirometer is a tool that measures how well you are filling your lungs with each breath. Learning to take long, deep breaths using this tool can help you keep your lungs clear and active. This may help to reverse or lessen your chance of  developing breathing (pulmonary) problems, especially infection. You may be asked to use a spirometer: After a surgery. If you have a lung problem or a history of smoking. After a long period of time when you have been unable to move or be active. If the spirometer includes an indicator to show the highest number that you have reached, your health care provider or respiratory therapist will help you set a goal. Keep a log of your progress as told by your health care provider. What are the risks? Breathing too quickly may cause dizziness or cause you to pass out. Take your time so you do not get dizzy  or light-headed. If you are in pain, you may need to take pain medicine before doing incentive spirometry. It is harder to take a deep breath if you are having pain. How to use your incentive spirometer  Sit up on the edge of your bed or on a chair. Hold the incentive spirometer so that it is in an upright position. Before you use the spirometer, breathe out normally. Place the mouthpiece in your mouth. Make sure your lips are closed tightly around it. Breathe in slowly and as deeply as you can through your mouth, causing the piston or the ball to rise toward the top of the chamber. Hold your breath for 3-5 seconds, or for as long as possible. If the spirometer includes a coach indicator, use this to guide you in breathing. Slow down your breathing if the indicator goes above the marked areas. Remove the mouthpiece from your mouth and breathe out normally. The piston or ball will return to the bottom of the chamber. Rest for a few seconds, then repeat the steps 10 or more times. Take your time and take a few normal breaths between deep breaths so that you do not get dizzy or light-headed. Do this every 1-2 hours when you are awake. If the spirometer includes a goal marker to show the highest number you have reached (best effort), use this as a goal to work toward during each repetition. After each  set of 10 deep breaths, cough a few times. This will help to make sure that your lungs are clear. If you have an incision on your chest or abdomen from surgery, place a pillow or a rolled-up towel firmly against the incision when you cough. This can help to reduce pain while taking deep breaths and coughing. General tips When you are able to get out of bed: Walk around often. Continue to take deep breaths and cough in order to clear your lungs. Keep using the incentive spirometer until your health care provider says it is okay to stop using it. If you have been in the hospital, you may be told to keep using the spirometer at home. Contact a health care provider if: You are having difficulty using the spirometer. You have trouble using the spirometer as often as instructed. Your pain medicine is not giving enough relief for you to use the spirometer as told. You have a fever. Get help right away if: You develop shortness of breath. You develop a cough with bloody mucus from the lungs. You have fluid or blood coming from an incision site after you cough. Summary An incentive spirometer is a tool that can help you learn to take long, deep breaths to keep your lungs clear and active. You may be asked to use a spirometer after a surgery, if you have a lung problem or a history of smoking, or if you have been inactive for a long period of time. Use your incentive spirometer as instructed every 1-2 hours while you are awake. If you have an incision on your chest or abdomen, place a pillow or a rolled-up towel firmly against your incision when you cough. This will help to reduce pain. Get help right away if you have shortness of breath, you cough up bloody mucus, or blood comes from your incision when you cough. This information is not intended to replace advice given to you by your health care provider. Make sure you discuss any questions you have with your health care provider. Document Revised:  11/18/2019  Document Reviewed: 11/18/2019               Elsevier Patient Education  2023 [employer]    Please go to the following website to access important education materials concerning your upcoming joint replacement.                                   [email or web address]

## 2023-10-10 NOTE — Progress Notes (Signed)
  Perioperative Services: Pre-Admission/Anesthesia Testing  Abnormal Lab Notification    Date: 10/10/23  Name: Deborah Obrien MRN:   [id number]  Re: Abnormal labs noted during PAT appointment   Provider(s) Notified: Signa Kell, MD (Surgeon)          Melonie Florida, FNP (PCP) Notification mode: Routed and/or faxed via [facility]   ABNORMAL LAB VALUE(S): Lab Results  Component Value Date   GLUCOSE 129 (H) 10/10/2023    Notes: Patient with a T2DM diagnosis. She is currently on parenteral therapy using both short acting (Novolin R) and basal (Lantus) therapies. Last A1c on fine with [place] was in 01/2023, at which time she was under better control at 6.8%.  Hgb A1c was added to preoperative labs today and found to be elevated at 9.2%. Patient reported to surgeon that CBGs had been running in the 300-400 range, which patient attributed to "being more stressed".   In efforts to reduce the risk of developing SSI/PJI, or other potential perioperative complications, this communication is being sent in order to determine if patient is deemed to be adequately optimized  for surgery.   In light of the aforementioned A1c, her diabetes could pose increased risks for the patient during their perioperative course and subsequent recovery period. This was discussed with patient in the office by Dr. Allena Katz. With that being said, the benefit of improving glycemic control must be weighed against the overall risks associated with delaying a necessary surgical procedure for this patient.   Note sent to surgeon to make him aware of updated A1c. Will also forward copy of note to PCP for outpatient follow up and ongoing management of her chronic T2DM.   Quentin Mulling, MSN, APRN, FNP-C, CEN [facility]  Perioperative Services Nurse Practitioner Phone: [phone] Fax: [phone] 10/10/23 9:21 PM

## 2023-10-13 ENCOUNTER — Encounter: Payer: Self-pay | Admitting: Orthopedic Surgery

## 2023-10-13 NOTE — Progress Notes (Incomplete)
Perioperative / Anesthesia Services  Pre-Admission Testing Clinical Review / Pre-Operative Anesthesia Consult  Date: 10/13/23  Patient Demographics:  Name: Deborah Obrien DOB: [birth year] MRN:   [id number]  Planned Surgical Procedure(s):    Case: [id number] Date/Time: 10/17/23 1443   Procedure: Right hip removal of nail hardware with screw exchange (Right)   Anesthesia type: Choice   Pre-op diagnosis: Pain from implanted hardware, subsequent encounter T85.848D   Location: [facility] OR ROOM 01 / [facility]   Surgeons: Signa Kell, MD      NOTE: Available PAT nursing documentation and vital signs have been reviewed. Clinical nursing staff has updated patient's PMH/PSHx, current medication list, and drug allergies/intolerances to ensure comprehensive history available to assist in medical decision making as it pertains to the aforementioned surgical procedure and anticipated anesthetic course. Extensive review of available clinical information personally performed. [place] PM[facility] and PSHx updated with any diagnoses/procedures that  may have been inadvertently omitted during his intake with the pre-admission testing department's nursing staff.  Clinical Discussion:  Deborah Obrien is a 83 y.o. female who is submitted for pre-surgical anesthesia review and clearance prior to her undergoing the above procedure. Patient has never been a smoker in the past. Pertinent PMH includes: CAD, HFpEF, atrial fibrillation, SA node dysfunction/SSS (s/p PPM placement). BILATERAL carotid artery disease, TIA, chronic cerebral microvascular disease, aortic athersclerosis, HTN, HLD, T2DM, hypothyroidism, CKD-IV, pulmonary hypertension, GERD (on daily H2 blocker), gastritis, GI bleeding,  anemia, OA, cervical DDD () s/p ACDF C6-C7), chornic back pain, peripheral neuropathy, anxiety, depression.   Patient is followed by cardiology Graciela Husbands, MD). She was last seen in the cardiology clinic on ***;  notes reviewed. ***At the time of her clinic visit, patient doing well overall from a cardiovascular perspective. Patient denied any chest pain, shortness of breath, PND, orthopnea, palpitations, significant peripheral edema, weakness, fatigue, vertiginous symptoms, or presyncope/syncope. Patient with a past medical history significant for cardiovascular diagnoses. Documented physical exam was grossly benign, providing no evidence of acute exacerbation and/or decompensation of the patient's known cardiovascular conditions.  ***  Blood pressure***controlled at *** mmHg on currently prescribed *** therapies.  Patient is on *** for her HLD diagnosis and ASCVD prevention. ***Patient is not diabetic. ***She does not have an OSAH diagnosis. ***FC. No changes were made to her medication regimen during her visit with cardiology.  Patient scheduled to follow-up with outpatient cardiology in***months or sooner if needed.  Deborah Obrien is scheduled for an elective RIGHT HIP REMOVAL OF NAIL HARDWARE WITH SCREW EXCHANGE (RIGHT) on 10/17/2023 with Dr. Signa Kell, MD.  Given patient's past medical history significant for cardiovascular diagnoses, presurgical cardiac clearance was sought by the PAT team. Per cardiology, "this patient is optimized for surgery and may proceed with the planned procedural course with a ACCEPTABLE risk of significant perioperative cardiovascular complications".  Again, this patient is on daily oral anticoagulation therapy using a DOAC medication.  She has been instructed on recommendations for holding her apixaban for 3 days prior to her procedure with plans to restart as soon as postoperative bleeding risk felt to be minimized by his attending surgeon. The patient has been instructed that her last dose of apixaban should be on 10/13/2023.  Patient denies previous perioperative complications with anesthesia in the past. In review her EMR, it is noted that patient underwent a general  anesthetic course here at [facility] in 01/2023 without documented complications.      10/10/2023  11:28 AM 07/03/2023    9:58 AM 04/22/2023    4:35 PM  Vitals with BMI  Height 5\' 3"  5\' 3"    Weight 155 lbs 6 oz 157 lbs 6 oz   BMI 27.53 27.88   Systolic 133 110 409  Diastolic 68 60 65  Pulse 83 64 61   Providers/Specialists:  NOTE: Primary physician provider listed below. Patient may have been seen by APP or partner within same practice.   PROVIDER ROLE / SPECIALTY LAST Sarina Ser, MD Orthopedics (Surgeon) 09/21/2023  Melonie Florida, FNP Primary Care Provider ???  Hurman Horn, MD Cardiology / Electrophysiology  07/03/2023; preop APP call 10/03/2023   Allergies:   Allergies  Allergen Reactions   Fish Allergy Shortness Of Breath    All kinds of fish. Severe vomitting even with smell of fish.    Macrolides And Ketolides Other (See Comments)    Severe stomach pain (mycins)   Meperidine Shortness Of Breath, Nausea Only and Other (See Comments)    Stomach pain    Other Nausea Only, Swelling, Rash, Anaphylaxis, Diarrhea, Shortness Of Breath and Other (See Comments)    Allergen: NON-STEROIDS, bextra - hands and feet swell  Reaction:  Stomach pain   Other Reaction: Intolerance   Prednisone Anaphylaxis and Other (See Comments)    (facial swelling/redness/burning)Increases pts blood sugar  Pt states that she is allergic to all steroids.     Shellfish Allergy Anaphylaxis, Shortness Of Breath, Diarrhea, Nausea Only and Rash    Stomach pain    Sulfa Antibiotics Shortness Of Breath, Diarrhea, Nausea Only and Other (See Comments)    Reaction:  Stomach pain     Sulfacetamide Sodium Diarrhea, Nausea Only, Other (See Comments) and Shortness Of Breath    Reaction:  Stomach pain    Telbivudine Other (See Comments)    Unknown reaction   Uloric [Febuxostat] Anaphylaxis    Locks pt's body up    Aspirin Rash and Other (See Comments)     Stomach pain (tolerates lower doses)   Celecoxib Other (See Comments)    GI bleed, weakness, and stomach pain.     Cephalexin Diarrhea, Nausea Only and Other (See Comments)    stomach pain    Codeine Diarrhea, Nausea Only and Other (See Comments)    Reaction:  Stomach pain Pt tolerates morphine    Dilaudid [Hydromorphone Hcl] Other (See Comments)    Reactions: easy to overdose - blood pressure drops really low   Erythromycin Diarrhea, Nausea Only and Other (See Comments)    Reaction:  Stomach pain    Iodinated Contrast Media Rash and Other (See Comments)    Pt states that she is unable to have because she has chronic kidney disease.     Lipitor [Atorvastatin Calcium] Nausea Only    Reaction: nausea, weakness, pass blood   Oxycodone Other (See Comments)    Reaction:  Stomach pain    Aleve [Naproxen]     Reaction: severe stomach pain   Atorvastatin Nausea Only and Other (See Comments)    Weakness    Cephalosporins Diarrhea, Nausea Only and Other (See Comments)    Other: abdominal pain  Tolerated 1st generation cephalosporin (CEFAZOLIN) on 06/02/2022 and 01/21/2023 without documented ADRs.    Doxycycline     Stomach pain    Iodine Rash   Motrin [Ibuprofen]     Reaction: severe stomach pain   Tape Other (See Comments)    Causes pts skin to tear  Pt states that she  is able to use paper tape.       Valdecoxib Swelling and Other (See Comments)    Pt states that her hands and feet swell.     Current Home Medications:   No current facility-administered medications for this encounter.    acetaminophen (TYLENOL) 325 MG tablet   allopurinol (ZYLOPRIM) 100 MG tablet   alum & mag hydroxide-simeth (MAALOX/MYLANTA) 200-200-20 MG/5ML suspension   amiodarone (PACERONE) 200 MG tablet   apixaban (ELIQUIS) 2.5 MG TABS tablet   Cholecalciferol (VITAMIN D) 50 MCG (2000 UT) CAPS   docusate sodium (COLACE) 100 MG capsule   estradiol (ESTRACE) 0.1 MG/GM vaginal cream   famotidine (PEPCID) 10  MG tablet   Fluticasone Furoate (FLONASE SENSIMIST NA)   folic acid (FOLVITE) 1 MG tablet   furosemide (LASIX) 20 MG tablet   gabapentin (NEURONTIN) 300 MG capsule   ibandronate (BONIVA) 150 MG tablet   Insulin Regular Human (NOVOLIN R FLEXPEN RELION) 100 UNIT/ML KwikPen   LANTUS 100 UNIT/ML injection   levothyroxine (SYNTHROID) 75 MCG tablet   magnesium hydroxide (MILK OF MAGNESIA) 400 MG/5ML suspension   midodrine (PROAMATINE) 5 MG tablet   Multiple Vitamin (MULTIVITAMIN WITH MINERALS) TABS tablet   nystatin cream (MYCOSTATIN)   nystatin powder   Polyethyl Glycol-Propyl Glycol 0.4-0.3 % SOLN   polyethylene glycol (MIRALAX / GLYCOLAX) 17 g packet   rOPINIRole (REQUIP) 0.5 MG tablet   senna (SENOKOT) 8.6 MG tablet   sertraline (ZOLOFT) 100 MG tablet   sertraline (ZOLOFT) 25 MG tablet   solifenacin (VESICARE) 5 MG tablet   thiamine (VITAMIN B-1) 100 MG tablet   traMADol (ULTRAM) 50 MG tablet   vitamin B-12 (CYANOCOBALAMIN) 1000 MCG tablet   carboxymethylcellulose (REFRESH PLUS) 0.5 % SOLN   mupirocin ointment (BACTROBAN) 2 %   History:   Past Medical History:  Diagnosis Date   Acute respiratory failure (HCC) 04/09/2018   Anemia    Anxiety    Bowel obstruction (HCC)    Carotid artery disease (HCC)    Chronic heart failure with preserved ejection fraction (HFpEF) (HCC)    a.) TTE 04/10/2018: EF 60-65%, mildly dil LA/RA, mod dil RV, mod TR, PASP ; b.) TTE 01/22/2023: EF 60-65%, no rwma, low-nl RV fxn, RVSP 51.2 mmHg. Mild MR. Mod-sev TR.   CKD (chronic kidney disease), stage IV (HCC)    Closed fracture of one rib of right side 05/29/2020   Complete tear of left rotator cuff 10/16/2014   Coronary artery disease (non-obstructive) 08/25/2016   a.) LHC 08/25/2016:  25% oLM-LM, 20% mLAD< 15% o-pLCx - med mgmt; b.) MV 10/20/2017: no isch/infarct, EF 55-65%   Depression    Diabetic neuropathy (HCC)    Diarrhea 03/04/2022   Displaced intertrochanteric fracture of right femur  (HCC)    Fatty liver    Gastritis without bleeding    GI bleed    Gout    Hypertension    Hypothyroidism    Long term current use of amiodarone    Low back pain    history of kyphoplasty t12-l1   Morbid obesity (HCC)    Multiple gastric polyps    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   a.) presurgical PCR (+) 10/10/2023 prior to REMOVAL OF NAIL HARDWARE WITH SCREW EXCHANGE (RIGHT HIP)   On apixaban therapy    Osteoarthritis    Osteoporosis    PAF (paroxysmal atrial fibrillation) (HCC) 2016   a.) CHA2DS2-VASc = 9 (age x 2, sex, HFpEF, HTN, TIA x 2, vascular disease,  T2DM) as of 10/13/2023; b.) cardiac rate/rhythm maintained on oral amiodarone; chronically anticoagulated using apixaban   Presence of permanent cardiac pacemaker 07/01/2015   a.) PPM placed 07/01/2015; b.) device upgraded to dual chamber MDT Azure XT DR MRI SureScan (SN: [id number]) 08/29/2017   Pulmonary hypertension (HCC)    a.) TTE 04/10/2018: PASP 50 mmHg; b.) TTE 01/22/2023: RVSP 51.2 mmHg   Sinus node dysfunction (HCC)    a.) s/p PPM placement 06/2015 --> device upgraded 08/2017   Stroke (HCC)    T2DM (type 2 diabetes mellitus) (HCC)    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   Uncontrolled type 2 diabetes mellitus with hypoglycemia, with long-term current use of insulin (HCC) 03/04/2022   Urinary incontinence 10/17/2022   Vascular dementia, moderate, with anxiety [facility])    Past Surgical History:  Procedure Laterality Date   ABDOMINAL HYSTERECTOMY  1980   APPENDECTOMY     BACK SURGERY     2008. plate & screws    BACK SURGERY  2019   patient describes kyphoplasty for compression fractures, MD referral said fusion   BREAST BIOPSY Right 04/2015   stereo fibroadenomatous change, neg for atypia   BREAST EXCISIONAL BIOPSY Left    neg   CARDIAC CATHETERIZATION Left 08/25/2016    Procedure: Left Heart Cath and Coronary Angiography;  Surgeon: Lamar Blinks, MD;  Location: [facility] INVASIVE CV LAB;  Service: Cardiovascular;  Laterality: Left;   CARDIOVERSION N/A 06/28/2017   Procedure: CARDIOVERSION;  Surgeon: Lamar Blinks, MD;  Location: [facility] ORS;  Service: Cardiovascular;  Laterality: N/A;   CARDIOVERSION N/A 02/06/2018   Procedure: CARDIOVERSION;  Surgeon: Wendall Stade, MD;  Location: [facility] ENDOSCOPY;  Service: Cardiovascular;  Laterality: N/A;   CARDIOVERSION N/A 07/15/2020   Procedure: CARDIOVERSION;  Surgeon: Jake Bathe, MD;  Location: [facility] ENDOSCOPY;  Service: Cardiovascular;  Laterality: N/A;   CATARACT EXTRACTION, BILATERAL  2012   CHOLECYSTECTOMY     colon blockage  1999   COLON SURGERY  2000   removed 20% of colon. colon had collapsed   COLONOSCOPY  2016   polyps removed 2016   DORSAL COMPARTMENT RELEASE Left 09/14/2016   Procedure: RELEASE DORSAL COMPARTMENT (DEQUERVAIN);  Surgeon: Donato Heinz, MD;  Location: [facility] ORS;  Service: Orthopedics;  Laterality: Left;   ESOPHAGOGASTRODUODENOSCOPY N/A 01/27/2015   Procedure: ESOPHAGOGASTRODUODENOSCOPY (EGD);  Surgeon: Christena Deem, MD;  Location: [facility] ENDOSCOPY;  Service: Endoscopy;  Laterality: N/A;   ESOPHAGOGASTRODUODENOSCOPY (EGD) WITH PROPOFOL N/A 07/24/2015   Procedure: ESOPHAGOGASTRODUODENOSCOPY (EGD) WITH PROPOFOL;  Surgeon: Midge Minium, MD;  Location: [facility] ENDOSCOPY;  Service: Endoscopy;  Laterality: N/A;   ESOPHAGOGASTRODUODENOSCOPY (EGD) WITH PROPOFOL N/A 04/12/2018   Procedure: ESOPHAGOGASTRODUODENOSCOPY (EGD) WITH PROPOFOL;  Surgeon: Wyline Mood, MD;  Location: [facility] ENDOSCOPY;  Service: Gastroenterology;  Laterality: N/A;   ESOPHAGOGASTRODUODENOSCOPY (EGD) WITH PROPOFOL N/A 06/22/2018   Procedure: ESOPHAGOGASTRODUODENOSCOPY (EGD) WITH PROPOFOL;  Surgeon: Rachael Fee, MD;  Location: [facility] ENDOSCOPY;  Service: Endoscopy;  Laterality: N/A;   ESOPHAGOGASTRODUODENOSCOPY (EGD) WITH PROPOFOL N/A  07/09/2018   Procedure: ESOPHAGOGASTRODUODENOSCOPY (EGD) WITH PROPOFOL with resection of gastric polyps;  Surgeon: Wyline Mood, MD;  Location: [facility] ENDOSCOPY;  Service: Gastroenterology;  Laterality: N/A;   ESOPHAGOGASTRODUODENOSCOPY (EGD) WITH PROPOFOL N/A 05/17/2019   Procedure: ESOPHAGOGASTRODUODENOSCOPY (EGD) WITH PROPOFOL;  Surgeon: Wyline Mood, MD;  Location: [facility] ENDOSCOPY;  Service: Gastroenterology;  Laterality: N/A;   EUS N/A  06/22/2018   Procedure: UPPER ENDOSCOPIC ULTRASOUND (EUS) RADIAL;  Surgeon: Rachael Fee, MD;  Location: [facility] ENDOSCOPY;  Service: Endoscopy;  Laterality: N/A;   EYE SURGERY Bilateral    cataract extractions   HUMERUS IM NAIL Left 06/02/2022   Procedure: INTRAMEDULLARY (IM) NAIL HUMERAL;  Surgeon: Christena Flake, MD;  Location: [facility] ORS;  Service: Orthopedics;  Laterality: Left;   INTRAMEDULLARY (IM) NAIL INTERTROCHANTERIC Right 01/21/2023   Procedure: INTRAMEDULLARY (IM) NAIL INTERTROCHANTERIC;  Surgeon: Signa Kell, MD;  Location: [facility] ORS;  Service: Orthopedics;  Laterality: Right;   JOINT REPLACEMENT Bilateral 2014   Bilateral Knee replacement   LEAD REVISION/REPAIR N/A 08/29/2017   Procedure: LEAD REVISION/REPAIR;  Surgeon: Regan Lemming, MD;  [facility]VAS[facility] CV LAB;  Service: Cardiovascular;  Laterality: N/A;   OOPHORECTOMY     PACEMAKER INSERTION Left 07/01/2015   Procedure: INSERTION PACEMAKER;  Surgeon: Marcina Millard, MD;  Location: [facility] ORS;  Service: Cardiovascular;  Laterality: Left;   PACEMAKER REVISION N/A 08/28/2017   Procedure: PACEMAKER REVISION;  Surgeon: Duke Salvia, MD;  Location: [facility] INVASIVE CV LAB;  Service: Cardiovascular;  Laterality: N/A;   REPLACEMENT TOTAL KNEE BILATERAL     SHOULDER ARTHROSCOPY WITH ROTATOR CUFF REPAIR AND OPEN BICEPS TENODESIS Left 11/21/2019   Procedure: LEFT SHOULDER ARTHROSCOPY WITH DEBRIDEMENT, DECOMPRESSION, ROTATOR CUFF REPAIR AND BICEPS TENOLYSIS;  Surgeon: Christena Flake, MD;   Location: [facility] ORS;  Service: Orthopedics;  Laterality: Left;   SHOULDER ARTHROSCOPY WITH SUBACROMIAL DECOMPRESSION Left 2013   TEE WITHOUT CARDIOVERSION N/A 06/28/2017   Procedure: TRANSESOPHAGEAL ECHOCARDIOGRAM (TEE);  Surgeon: Lamar Blinks, MD;  Location: [facility] ORS;  Service: Cardiovascular;  Laterality: N/A;   TUBAL LIGATION     Family History  Problem Relation Age of Onset   Diabetes Mellitus II Mother    CAD Mother    Heart attack Mother    Cancer Father        skin   Diabetes Mellitus II Brother    Stroke Brother    Breast cancer Neg Hx    Social History   Tobacco Use   Smoking status: Never   Smokeless tobacco: Never  Substance Use Topics   Alcohol use: No   Pertinent Clinical Results:  LABS:  Hospital Outpatient Visit on 10/10/2023  Component Date Value Ref Range Status   MRSA, PCR 10/10/2023 POSITIVE (A)  NEGATIVE Final   Comment: RESULT CALLED TO, READ BACK BY AND VERIFIED WITH: Hally Colella AT 1839 ON 10/10/23 BY SS    Staphylococcus aureus 10/10/2023 POSITIVE (A)  NEGATIVE Final   Comment: (NOTE) The Xpert SA Assay (FDA approved for NASAL specimens in patients 4 years of age and older), is one component of a comprehensive surveillance program. It is not intended to diagnose infection nor to guide or monitor treatment. Performed at [facility], [address] [place], [place] [address]    WBC 10/10/2023 4.9  4.0 - 10.5 K/uL Final   RBC 10/10/2023 4.00  3.87 - 5.11 MIL/uL Final   Hemoglobin 10/10/2023 12.7  12.0 - 15.0 g/dL Final   HCT 21/30/8657 38.3  36.0 - 46.0 % Final   MCV 10/10/2023 95.8  80.0 - 100.0 fL Final   MCH 10/10/2023 31.8  26.0 - 34.0 pg Final   MCHC 10/10/2023 33.2  30.0 - 36.0 g/dL Final   RDW 84/69/6295 13.2  11.5 - 15.5 % Final   Platelets 10/10/2023 158  150 - 400 K/uL Final   nRBC 10/10/2023 0.0  0.0 - 0.2 % Final   Neutrophils Relative % 10/10/2023 49  % Final   Neutro Abs 10/10/2023 2.4  1.7 - 7.7 K/uL Final    Lymphocytes Relative 10/10/2023 43  % Final   Lymphs Abs 10/10/2023 2.1  0.7 - 4.0 K/uL Final   Monocytes Relative 10/10/2023 6  % Final   Monocytes Absolute 10/10/2023 0.3  0.1 - 1.0 K/uL Final   Eosinophils Relative 10/10/2023 1  % Final   Eosinophils Absolute 10/10/2023 0.1  0.0 - 0.5 K/uL Final   Basophils Relative 10/10/2023 1  % Final   Basophils Absolute 10/10/2023 0.0  0.0 - 0.1 K/uL Final   Immature Granulocytes 10/10/2023 0  % Final   Abs Immature Granulocytes 10/10/2023 0.01  0.00 - 0.07 K/uL Final   Performed at [facility], [address] [place], [place] [address]   Sodium 10/10/2023 140  135 - 145 mmol/L Final   Potassium 10/10/2023 4.0  3.5 - 5.1 mmol/L Final   Chloride 10/10/2023 100  98 - 111 mmol/L Final   CO2 10/10/2023 24  22 - 32 mmol/L Final   Glucose, Bld 10/10/2023 129 (H)  70 - 99 mg/dL Final   Glucose reference range applies only to samples taken after fasting for at least 8 hours.   BUN 10/10/2023 46 (H)  8 - 23 mg/dL Final   Creatinine, Ser 10/10/2023 1.48 (H)  0.44 - 1.00 mg/dL Final   Calcium 19/14/7829 9.5  8.9 - 10.3 mg/dL Final   Total Protein 56/21/3086 7.5  6.5 - 8.1 g/dL Final   Albumin 57/84/6962 4.1  3.5 - 5.0 g/dL Final   AST 95/28/4132 36  15 - 41 U/L Final   ALT 10/10/2023 22  0 - 44 U/L Final   Alkaline Phosphatase 10/10/2023 110  38 - 126 U/L Final   Total Bilirubin 10/10/2023 1.2  0.0 - 1.2 mg/dL Final   GFR, Estimated 10/10/2023 35 (L)  >60 mL/min Final   Comment: (NOTE) Calculated using the CKD-EPI Creatinine Equation (2021)    Anion gap 10/10/2023 16 (H)  5 - 15 Final   Performed at [facility], [address] [place], [place] [address]   Color, Urine 10/10/2023 YELLOW (A)  YELLOW Final   APPearance 10/10/2023 CLEAR (A)  CLEAR Final   Specific Gravity, Urine 10/10/2023 1.009  1.005 - 1.030 Final   pH 10/10/2023 6.0  5.0 - 8.0 Final   Glucose, UA 10/10/2023 NEGATIVE  NEGATIVE mg/dL Final   Hgb urine dipstick  10/10/2023 NEGATIVE  NEGATIVE Final   Bilirubin Urine 10/10/2023 NEGATIVE  NEGATIVE Final   Ketones, ur 10/10/2023 NEGATIVE  NEGATIVE mg/dL Final   Protein, ur 27/25/3664 NEGATIVE  NEGATIVE mg/dL Final   Nitrite 40/34/7425 NEGATIVE  NEGATIVE Final   Leukocytes,Ua 10/10/2023 NEGATIVE  NEGATIVE Final   Performed at [facility], [address] [place], [place] [address]   ABO/RH(D) 10/10/2023 A POS   Final   Antibody Screen 10/10/2023 NEG   Final   Sample Expiration 10/10/2023 10/24/2023,2359   Final   Extend sample reason 10/10/2023    Final                   Value:NO TRANSFUSIONS OR PREGNANCY IN THE PAST 3 MONTHS Performed at [facility], [address] [place], [place] [address]    Hgb A1c MFr Bld 10/10/2023 9.2 (H)  4.8 - 5.6 % Final   Comment: (NOTE) Pre diabetes:  5.7%-6.4%  Diabetes:              >6.4%  Glycemic control for   <7.0% adults with diabetes    Mean Plasma Glucose 10/10/2023 217.34  mg/dL Final   Performed at [facility], [address], [place], [place] [address]    ECG: Date: 10/10/2023  Time ECG obtained: 1253 PM Rate: 65 bpm Rhythm: Atrial-paced rhythm with prolonged AV conduction Axis (leads I and aVF): normal Intervals: PR 258 ms. QRS 90 ms. QTc 459 ms. ST segment and T wave changes: No evidence of acute T wave abnormalities or significant ST segment elevation or depression. Evidence of an age undetermined septal infarct noted.  Comparison: Similar to previous tracing obtained on 07/03/2023   IMAGING / PROCEDURES: DIAGNOSTIC RADIOGRAPHS OF RIGHT HIP 2 OR 3 VIEWS WITH OR WITHOUT PELVIS performed on 09/21/2023 Short intramedullary nail about he proximal femur.   Fracture appears to have healed.   No significant change from prior radiographs.   Previously noted fracture collapse with lateral screw prominence is redemonstrated.   No new fractures or dislocations.   CT PELVIS WO CONTRAST performed on 04/22/2023  No  acute fracture or dislocation of the pelvis or bilateral hips. Postsurgical changes from ORIF of an intertrochanteric fracture of the proximal right femur. Fracture lines remain visible.  TRANSTHORACIC ECHOCARDIOGRAM performed on 01/22/2023 Left ventricular ejection fraction, by estimation, is 60 to 65%. Left ventricular ejection fraction by PLAX is 63 %. The left ventricle has normal function. The left ventricle has no regional wall motion abnormalities. Left ventricular diastolic parameters are indeterminate.  Right ventricular systolic function is low normal. The right ventricular size is mildly enlarged. There is moderately elevated pulmonary artery systolic pressure. The estimated right ventricular systolic pressure is 51.2 mmHg.  The mitral valve is normal in structure. Mild mitral valve regurgitation. No evidence of mitral stenosis.  Tricuspid valve regurgitation is moderate to severe.  The aortic valve is tricuspid. Aortic valve regurgitation is not visualized. No aortic stenosis is present.  The inferior vena cava is normal in size with greater than 50% respiratory variability, suggesting right atrial pressure of 3 mmHg.   CT HEAD WO CONTRAST performed on 05/29/2022 No acute intracranial abnormality. No acute displaced fracture or traumatic listhesis of the cervical spine in a patient status post C6-C7 anterior cervical discectomy and fusion.  MYOCARDIAL PERFUSION IMAGING STUDY (LEXISCAN) performed on 10/20/2017 Normal left ventricular systolic function with a normal LVEF of 55-65%% Normal myocardial thickening and wall motion Left ventricular cavity size normal SPECT images demonstrate homogenous tracer distribution throughout the myocardium No evidence of stress-induced myocardial ischemia or arrhythmia Normal low risk study  LEFT HEART CATHETERIZATION AND CORONARY ANGIOGRAPHY performed on 08/25/2016 The patient has had progressive canadian class 3 anginal symptoms with a high  probability stress test with risk factors including high blood pressure and high cholesterol. Normal left ventricular function with ejection fraction of 55% Mild 2 vessel coronary artery disease  There is mild (2+) mitral regurgitation. Ost LM to LM lesion, 25 %stenosed. Mid LAD lesion, 20 %stenosed. Ost Cx to Prox Cx lesion, 15 %stenosed. Recommendations: continue medical management of CAD risk factors and No further cardiac intervention at this time.   Impression and Plan:  Deborah Obrien has been referred for pre-anesthesia review and clearance prior to her undergoing the planned anesthetic and procedural courses. Available labs, pertinent testing, and imaging results were personally reviewed by me in preparation for upcoming operative/procedural course. [facility]  medical record has been updated following extensive record review and patient interview with PAT staff.   ATTENTION --> PENDING CLEARANCE AT THIS TIME -- NOTE/CONTENTS NOT FINAL UNTIL SIGNED This patient has been appropriately cleared by cardiology with an overall *** risk of experiencing significant perioperative cardiovascular complications. Completed perioperative prescription for cardiac device management documentation completed by primary cardiology team and placed on patient's chart for review by the surgical/anesthetic team on the day of her procedure. Electrophysiology indicating that procedure should not interfere with planned surgical procedure. Beyond normal perioperative cardiovascular monitoring, there are no recommendations from electrophysiology team that prompt further discussion/recommendations from industry representative.   Based on clinical review performed today (10/13/23), barring any significant acute changes in the patient's overall condition, it is anticipated that she will be able to proceed with the planned surgical intervention. Any acute changes in clinical condition may necessitate her procedure being  postponed and/or cancelled. Patient will meet with anesthesia team (MD and/or CRNA) on the day of her procedure for preoperative evaluation/assessment. Questions regarding anesthetic course will be fielded at that time.   Pre-surgical instructions were reviewed with the patient during his PAT appointment, and questions were fielded to satisfaction by PAT clinical staff. She has been instructed on which medications that she will need to hold prior to surgery, as well as the ones that have been deemed safe/appropriate to take on the day of his procedure. As part of the general education provided by PAT, patient made aware both verbally and in writing, that she would need to abstain from the use of any illegal substances during his perioperative course. She was advised that failure to follow the provided instructions could necessitate case cancellation or result in serious perioperative complications up to and including death. Patient encouraged to contact PAT and/or her surgeon's office to discuss any questions or concerns that may arise prior to surgery; verbalized understanding.   Quentin Mulling, MSN, APRN, FNP-C, CEN [facility]  Perioperative Services Nurse Practitioner Phone: [phone] Fax: [phone] 10/13/23 3:52 PM  NOTE: This note has been prepared using Dragon dictation software. Despite my best ability to proofread, there is always the potential that unintentional transcriptional errors may still occur from this process.

## 2023-10-16 ENCOUNTER — Encounter: Payer: Self-pay | Admitting: Orthopedic Surgery

## 2023-10-17 ENCOUNTER — Encounter: Payer: Self-pay | Admitting: Orthopedic Surgery

## 2023-10-17 ENCOUNTER — Ambulatory Visit: Payer: 59 | Admitting: Urgent Care

## 2023-10-17 ENCOUNTER — Ambulatory Visit: Payer: 59

## 2023-10-17 ENCOUNTER — Encounter
Admission: RE | Disposition: A | Discharge status: Home / Self Care | Payer: Self-pay | Source: Ambulatory Visit | Attending: Orthopedic Surgery

## 2023-10-17 ENCOUNTER — Other Ambulatory Visit: Payer: Self-pay

## 2023-10-17 ENCOUNTER — Ambulatory Visit
Admission: RE | Admit: 2023-10-17 | Discharge: 2023-10-17 | Disposition: A | Discharge status: Home / Self Care | Payer: 59 | Source: Ambulatory Visit | Attending: Orthopedic Surgery | Admitting: Orthopedic Surgery

## 2023-10-17 DIAGNOSIS — I2510 Atherosclerotic heart disease of native coronary artery without angina pectoris: Secondary | ICD-10-CM | POA: Insufficient documentation

## 2023-10-17 DIAGNOSIS — Z96653 Presence of artificial knee joint, bilateral: Secondary | ICD-10-CM | POA: Diagnosis not present

## 2023-10-17 DIAGNOSIS — I480 Paroxysmal atrial fibrillation: Secondary | ICD-10-CM | POA: Insufficient documentation

## 2023-10-17 DIAGNOSIS — I700 Atherosclerosis of aorta: Secondary | ICD-10-CM | POA: Insufficient documentation

## 2023-10-17 DIAGNOSIS — Y798 Miscellaneous orthopedic devices associated with adverse incidents, not elsewhere classified: Secondary | ICD-10-CM | POA: Diagnosis not present

## 2023-10-17 DIAGNOSIS — I5032 Chronic diastolic (congestive) heart failure: Secondary | ICD-10-CM | POA: Insufficient documentation

## 2023-10-17 DIAGNOSIS — E1122 Type 2 diabetes mellitus with diabetic chronic kidney disease: Secondary | ICD-10-CM | POA: Diagnosis not present

## 2023-10-17 DIAGNOSIS — M25551 Pain in right hip: Secondary | ICD-10-CM | POA: Insufficient documentation

## 2023-10-17 DIAGNOSIS — N184 Chronic kidney disease, stage 4 (severe): Secondary | ICD-10-CM | POA: Diagnosis not present

## 2023-10-17 DIAGNOSIS — I130 Hypertensive heart and chronic kidney disease with heart failure and stage 1 through stage 4 chronic kidney disease, or unspecified chronic kidney disease: Secondary | ICD-10-CM | POA: Insufficient documentation

## 2023-10-17 DIAGNOSIS — T8484XA Pain due to internal orthopedic prosthetic devices, implants and grafts, initial encounter: Secondary | ICD-10-CM | POA: Insufficient documentation

## 2023-10-17 DIAGNOSIS — K219 Gastro-esophageal reflux disease without esophagitis: Secondary | ICD-10-CM | POA: Diagnosis not present

## 2023-10-17 DIAGNOSIS — E1140 Type 2 diabetes mellitus with diabetic neuropathy, unspecified: Secondary | ICD-10-CM | POA: Insufficient documentation

## 2023-10-17 DIAGNOSIS — Z950 Presence of cardiac pacemaker: Secondary | ICD-10-CM | POA: Diagnosis not present

## 2023-10-17 HISTORY — PX: HARDWARE REMOVAL: SHX979

## 2023-10-17 HISTORY — DX: Other long term (current) drug therapy: Z79.899

## 2023-10-17 HISTORY — DX: Atherosclerosis of aorta: I70.0

## 2023-10-17 HISTORY — DX: Type 2 diabetes mellitus without complications: E11.9

## 2023-10-17 HISTORY — DX: Other cerebrovascular disease: I67.89

## 2023-10-17 HISTORY — DX: Gastro-esophageal reflux disease without esophagitis: K21.9

## 2023-10-17 HISTORY — DX: Polyp of colon: K63.5

## 2023-10-17 HISTORY — DX: Long term (current) use of anticoagulants: Z79.01

## 2023-10-17 HISTORY — DX: Other cervical disc degeneration, unspecified cervical region: M50.30

## 2023-10-17 LAB — GLUCOSE, CAPILLARY
Glucose-Capillary: 214 mg/dL — ABNORMAL HIGH (ref 70–99)
Glucose-Capillary: 237 mg/dL — ABNORMAL HIGH (ref 70–99)

## 2023-10-17 SURGERY — REMOVAL, HARDWARE
Anesthesia: General | Site: Hip | Laterality: Right

## 2023-10-17 MED ORDER — TRAMADOL HCL 50 MG PO TABS: 50.0000 mg | ORAL_TABLET | Freq: Once | ORAL | Status: DC

## 2023-10-17 MED ORDER — LIDOCAINE HCL (CARDIAC) PF 100 MG/5ML IV SOSY
PREFILLED_SYRINGE | INTRAVENOUS | Status: DC | PRN
  Administered 2023-10-17: 100 mg via INTRAVENOUS

## 2023-10-17 MED ORDER — FENTANYL CITRATE (PF) 100 MCG/2ML IJ SOLN
INTRAMUSCULAR | Status: DC | PRN
  Administered 2023-10-17 (×2): 50 ug via INTRAVENOUS

## 2023-10-17 MED ORDER — DEXAMETHASONE SODIUM PHOSPHATE 10 MG/ML IJ SOLN
INTRAMUSCULAR | Status: AC
  Filled 2023-10-17: qty 1

## 2023-10-17 MED ORDER — CHLORHEXIDINE GLUCONATE 0.12 % MT SOLN
OROMUCOSAL | Status: AC
  Filled 2023-10-17: qty 15

## 2023-10-17 MED ORDER — ACETAMINOPHEN 500 MG PO TABS: 1000.0000 mg | ORAL_TABLET | Freq: Three times a day (TID) | ORAL | 2 refills | Status: AC

## 2023-10-17 MED ORDER — HYDROMORPHONE HCL 1 MG/ML IJ SOLN
INTRAMUSCULAR | Status: AC
  Filled 2023-10-17: qty 1

## 2023-10-17 MED ORDER — TRAMADOL HCL 50 MG PO TABS
ORAL_TABLET | ORAL | Status: AC
  Filled 2023-10-17: qty 1

## 2023-10-17 MED ORDER — 0.9 % SODIUM CHLORIDE (POUR BTL) OPTIME
TOPICAL | Status: DC | PRN
  Administered 2023-10-17: 500 mL

## 2023-10-17 MED ORDER — VANCOMYCIN HCL IN DEXTROSE 1-5 GM/200ML-% IV SOLN
1000.0000 mg | INTRAVENOUS | Status: AC
  Administered 2023-10-17: 1000 mg via INTRAVENOUS

## 2023-10-17 MED ORDER — ACETAMINOPHEN 10 MG/ML IV SOLN
INTRAVENOUS | Status: AC
  Filled 2023-10-17: qty 100

## 2023-10-17 MED ORDER — PHENYLEPHRINE HCL-NACL 20-0.9 MG/250ML-% IV SOLN
INTRAVENOUS | Status: AC
  Filled 2023-10-17: qty 250

## 2023-10-17 MED ORDER — TRAMADOL HCL 50 MG PO TABS: 50.0000 mg | ORAL_TABLET | ORAL | 0 refills | Status: DC | PRN

## 2023-10-17 MED ORDER — ACETAMINOPHEN 10 MG/ML IV SOLN: 1000.0000 mg | Freq: Once | INTRAVENOUS | Status: DC | PRN

## 2023-10-17 MED ORDER — CEFAZOLIN SODIUM-DEXTROSE 2-4 GM/100ML-% IV SOLN
2.0000 g | Freq: Once | INTRAVENOUS | Status: AC
  Administered 2023-10-17: 2 g via INTRAVENOUS

## 2023-10-17 MED ORDER — GLYCOPYRROLATE 0.2 MG/ML IJ SOLN
INTRAMUSCULAR | Status: DC | PRN
  Administered 2023-10-17: .2 mg via INTRAVENOUS

## 2023-10-17 MED ORDER — FENTANYL CITRATE (PF) 100 MCG/2ML IJ SOLN
25.0000 ug | INTRAMUSCULAR | Status: DC | PRN
  Administered 2023-10-17 (×3): 25 ug via INTRAVENOUS

## 2023-10-17 MED ORDER — HYDROMORPHONE HCL 1 MG/ML IJ SOLN
0.5000 mg | INTRAMUSCULAR | Status: DC | PRN
  Administered 2023-10-17 (×2): 0.25 mg via INTRAVENOUS

## 2023-10-17 MED ORDER — FENTANYL CITRATE (PF) 100 MCG/2ML IJ SOLN
INTRAMUSCULAR | Status: AC
  Filled 2023-10-17: qty 2

## 2023-10-17 MED ORDER — ACETAMINOPHEN 10 MG/ML IV SOLN
INTRAVENOUS | Status: DC | PRN
  Administered 2023-10-17: 1000 mg via INTRAVENOUS

## 2023-10-17 MED ORDER — OXYCODONE HCL 5 MG PO TABS: 5.0000 mg | ORAL_TABLET | Freq: Once | ORAL | Status: DC | PRN

## 2023-10-17 MED ORDER — BUPIVACAINE LIPOSOME 1.3 % IJ SUSP
INTRAMUSCULAR | Status: DC | PRN
  Administered 2023-10-17: 30 mL

## 2023-10-17 MED ORDER — ONDANSETRON HCL 4 MG/2ML IJ SOLN
INTRAMUSCULAR | Status: AC
  Filled 2023-10-17: qty 2

## 2023-10-17 MED ORDER — CHLORHEXIDINE GLUCONATE 0.12 % MT SOLN
15.0000 mL | Freq: Once | OROMUCOSAL | Status: AC
  Administered 2023-10-17: 15 mL via OROMUCOSAL

## 2023-10-17 MED ORDER — ORAL CARE MOUTH RINSE: 15.0000 mL | Freq: Once | OROMUCOSAL | Status: AC

## 2023-10-17 MED ORDER — BUPIVACAINE HCL (PF) 0.5 % IJ SOLN
INTRAMUSCULAR | Status: AC
  Filled 2023-10-17: qty 30

## 2023-10-17 MED ORDER — SUCCINYLCHOLINE CHLORIDE 200 MG/10ML IV SOSY
PREFILLED_SYRINGE | INTRAVENOUS | Status: DC | PRN
  Administered 2023-10-17: 140 mg via INTRAVENOUS

## 2023-10-17 MED ORDER — ONDANSETRON 4 MG PO TBDP: 4.0000 mg | ORAL_TABLET | Freq: Three times a day (TID) | ORAL | 0 refills | Status: DC | PRN

## 2023-10-17 MED ORDER — PROPOFOL 1000 MG/100ML IV EMUL
INTRAVENOUS | Status: AC
  Filled 2023-10-17: qty 100

## 2023-10-17 MED ORDER — EPHEDRINE SULFATE-NACL 50-0.9 MG/10ML-% IV SOSY
PREFILLED_SYRINGE | INTRAVENOUS | Status: DC | PRN
  Administered 2023-10-17 (×4): 5 mg via INTRAVENOUS

## 2023-10-17 MED ORDER — VANCOMYCIN HCL IN DEXTROSE 1-5 GM/200ML-% IV SOLN
INTRAVENOUS | Status: AC
  Filled 2023-10-17: qty 200

## 2023-10-17 MED ORDER — CEFAZOLIN SODIUM-DEXTROSE 2-4 GM/100ML-% IV SOLN
INTRAVENOUS | Status: AC
  Filled 2023-10-17: qty 100

## 2023-10-17 MED ORDER — LIDOCAINE HCL (PF) 2 % IJ SOLN
INTRAMUSCULAR | Status: AC
  Filled 2023-10-17: qty 5

## 2023-10-17 MED ORDER — PROPOFOL 10 MG/ML IV BOLUS
INTRAVENOUS | Status: DC | PRN
  Administered 2023-10-17: 150 mg via INTRAVENOUS

## 2023-10-17 MED ORDER — OXYCODONE HCL 5 MG/5ML PO SOLN: 5.0000 mg | Freq: Once | ORAL | Status: DC | PRN

## 2023-10-17 MED ORDER — BUPIVACAINE LIPOSOME 1.3 % IJ SUSP
INTRAMUSCULAR | Status: AC
  Filled 2023-10-17: qty 20

## 2023-10-17 MED ORDER — SODIUM CHLORIDE 0.9 % IV SOLN
INTRAVENOUS | Status: DC
Start: 2023-10-17 — End: 2023-10-17

## 2023-10-17 MED ORDER — LORAZEPAM 2 MG/ML IJ SOLN
0.5000 mg | Freq: Once | INTRAMUSCULAR | Status: AC
Start: 2023-10-17 — End: 2023-10-17
  Administered 2023-10-17: 0.25 mg via INTRAVENOUS

## 2023-10-17 MED ORDER — DROPERIDOL 2.5 MG/ML IJ SOLN: 0.6250 mg | Freq: Once | INTRAMUSCULAR | Status: DC | PRN

## 2023-10-17 MED ORDER — LORAZEPAM 2 MG/ML IJ SOLN
INTRAMUSCULAR | Status: AC
  Filled 2023-10-17: qty 1

## 2023-10-17 MED ORDER — PHENYLEPHRINE 80 MCG/ML (10ML) SYRINGE FOR IV PUSH (FOR BLOOD PRESSURE SUPPORT)
PREFILLED_SYRINGE | INTRAVENOUS | Status: DC | PRN
  Administered 2023-10-17: 100 ug via INTRAVENOUS
  Administered 2023-10-17: 200 ug via INTRAVENOUS
  Administered 2023-10-17 (×3): 100 ug via INTRAVENOUS

## 2023-10-17 SURGICAL SUPPLY — 33 items
BLADE SURG 15 STRL LF DISP TIS (BLADE) ×1 IMPLANT
CHLORAPREP W/TINT 26 (MISCELLANEOUS) ×1 IMPLANT
DRAPE SHEET LG 3/4 BI-LAMINATE (DRAPES) ×1 IMPLANT
DRAPE SURG 17X11 SM STRL (DRAPES) ×2 IMPLANT
DRAPE U-SHAPE 47X51 STRL (DRAPES) ×2 IMPLANT
DRSG OPSITE POSTOP 3X4 (GAUZE/BANDAGES/DRESSINGS) ×3 IMPLANT
ELECT REM PT RETURN 9FT ADLT (ELECTROSURGICAL) ×1
ELECTRODE REM PT RTRN 9FT ADLT (ELECTROSURGICAL) ×1 IMPLANT
GAUZE XEROFORM 4X4 STRL (GAUZE/BANDAGES/DRESSINGS) ×1 IMPLANT
GLOVE BIOGEL PI IND STRL 8 (GLOVE) ×1 IMPLANT
GLOVE SURG SYN 7.5 E (GLOVE) ×1 IMPLANT
GLOVE SURG SYN 7.5 PF PI (GLOVE) ×1 IMPLANT
GOWN STRL REUS W/ TWL LRG LVL3 (GOWN DISPOSABLE) ×1 IMPLANT
GOWN STRL REUS W/ TWL XL LVL3 (GOWN DISPOSABLE) ×1 IMPLANT
GUIDE PIN 3.2X343 (PIN) ×1
KIT PATIENT CARE HANA TABLE (KITS) ×1 IMPLANT
KIT TURNOVER CYSTO (KITS) ×1 IMPLANT
MANIFOLD NEPTUNE II (INSTRUMENTS) ×1 IMPLANT
MAT ABSORB FLUID 56X50 GRAY (MISCELLANEOUS) ×2 IMPLANT
NDL HYPO 22X1.5 SAFETY MO (MISCELLANEOUS) ×1 IMPLANT
NEEDLE HYPO 22X1.5 SAFETY MO (MISCELLANEOUS) ×1 IMPLANT
NS IRRIG 500ML POUR BTL (IV SOLUTION) ×1 IMPLANT
PACK HIP COMPR (MISCELLANEOUS) ×1 IMPLANT
PIN GUIDE 3.2X343MM (PIN) IMPLANT
SCREW LAG COMPR KIT 80/75 (Screw) IMPLANT
STAPLER SKIN PROX 35W (STAPLE) ×1 IMPLANT
SUT VIC AB 0 CT1 36 (SUTURE) IMPLANT
SUT VIC AB 2-0 CT2 27 (SUTURE) ×1 IMPLANT
SYR 10ML LL (SYRINGE) ×1 IMPLANT
SYR 30ML LL (SYRINGE) ×1 IMPLANT
TAPE CLOTH 3X10 WHT NS LF (GAUZE/BANDAGES/DRESSINGS) ×2 IMPLANT
TRAP FLUID SMOKE EVACUATOR (MISCELLANEOUS) ×1 IMPLANT
WATER STERILE IRR 500ML POUR (IV SOLUTION) ×1 IMPLANT

## 2023-10-17 NOTE — Anesthesia Procedure Notes (Signed)
 Procedure Name: LMA Insertion Date/Time: 10/17/2023 2:51 PM  Performed by: Nada Corean CROME, CRNAPre-anesthesia Checklist: Patient identified, Emergency Drugs available, Suction available, Patient being monitored and Timeout performed Patient Re-evaluated:Patient Re-evaluated prior to induction Oxygen Delivery Method: Circle system utilized Preoxygenation: Pre-oxygenation with 100% oxygen Induction Type: IV induction Ventilation: Mask ventilation without difficulty LMA: LMA inserted LMA Size: 3.0 Number of attempts: 2 Dental Injury: Teeth and Oropharynx as per pre-operative assessment

## 2023-10-17 NOTE — Op Note (Addendum)
 Operative Note    SURGERY DATE: 10/17/2023   PRE-OP  DIAGNOSIS:  1. Right hip painful retained hardware   POST-OP DIAGNOSIS:  1. Right hip painful retained hardware   PROCEDURES:  1. Right hip removal of deep hardware 2. Right hip placement of cephalomedullary lag screw and compression screw   SURGEON: Earnestine HILARIO Blanch, MD  ASSISTANT: DOROTHA Krystal Doyne, PA; Leita Kidd, PA-S    ANESTHESIA: Gen   ESTIMATED BLOOD LOSS: 50cc   TOTAL IV FLUIDS: see anesthesia record  IMPLANTS: [employer] Trigen Intertan 131mm/95 screws removed and replaced with 80/63mm screws.   INDICATION(S): Deborah Obrien is a 83 y.o. female who initially underwent Right hip cephalomedullary nailing by me on 01/21/23.  Postoperative radiographs showed that the fracture healed, but had collapsed.  This led to prominent cephalomedullary lag screw and compression screw.  Patient had failed nonoperative management of this and had persistent lateral sided hip pain and snapping sensations with ambulation. After discussion of risks, benefits, and alternatives to surgery, the patient elected to proceed.    OPERATIVE REPORT:   I identified Deborah Obrien in the pre-operative holding area. Informed consent was obtained and the surgical site was marked. I reviewed the risks and benefits of the proposed surgical intervention and the patient wished to proceed. The patient was transferred to the operative suite and anesthesia was administered. The patient was transferred to the operating room table and placed in a supine position on the Hana table. The extremity was then prepped and draped in standard fashion. A time out was performed confirming the correct extremity, correct patient, and correct procedure.    We utilized the prior proximal-most incision as well as the middle incision.  From the proximal incision, the set screw was loosened.  Utilizing the middle incision, dissection was carried down to the IT band.  This was  split in line with its fibers over the region of the prominent hardware.  Screw heads were then palpable and noted to be prominent by approximately 20 mm.  Guidewire was placed through the lag screw.  Compression screw was removed, followed by the lag screw.  Given the large void in the bone, we elected to replace with shorter screws to reduce risk of femoral neck fracture postoperatively.  The shorter sized screws indicated above were then placed in a standard fashion.  Fluoroscopy was used to confirm that the screws were of an appropriate length, and they were essentially flush with the lateral cortex of the proximal femur.  Set screw was tightened.  The wounds were thoroughly irrigated.  Local anesthetic with Exparel  was injected about the wounds.    IT band was closed with 0 Vicryl. Subdermal layer was closed with buried 2-0 Vicryl and skin was closed with staples.  Sterile dressings were applied.  The patient was awakened and transferred to a stretcher bed and to the post anesthesia care unit in stable condition.    Of note, assistance from a PA was essential to performing the surgery.  PA was present for the entire surgery.  PA assisted with patient positioning, retraction, instrumentation, and wound closure. The surgery would have been more difficult and had longer operative time without PA assistance.    POSTOPERATIVE PLAN: Weightbearing as tolerated on operative extremity. Patient to return to clinic in ~2 weeks for post-operative appointment.  Per discussion with patient and family, we will plan to omit DVT prophylaxis given patient's severe allergy to aspirin , and since patient will be able to  mobilize postoperatively.

## 2023-10-17 NOTE — Discharge Instructions (Addendum)
 Discharge Instructions - Hip Fracture Surgery   Post-Op Instructions   1. Bracing/crutches/walker - Use crutches or walker as needed until able to wean off.   2. Ice: You should ice the area of surgery ~20-30 minutes on with a similar time without ice. Do this for the first week at least to help with pain and swelling.    3. Driving:  Plan on not driving for at least one week. Please note that you are advised NOT to drive while taking narcotic pain medications as you may be impaired and unsafe to drive.   4. Activity: Ankle pumps several times an hour while awake to prevent blood clots. Weight bearing: as tolerated. Use crutches or walker as needed until pain allows you to ambulate without a limp. Bending and straightening the knee and hip is unlimited. Avoid standing more than 5-10 minutes (consecutively) for the first week.  Avoid long distance travel for 2 weeks.  5. Medications:  - You have been provided a prescription for narcotic pain medicine. After surgery, take 1-2 narcotic tablets every 4 hours if needed for severe pain.  - You may take up to 3000mg /day of tylenol  (acetaminophen ). You can take 1000mg  3x/day. Please check your narcotic. If you have acetaminophen  in your narcotic (each tablet will be 325mg ), be careful not to exceed a total of 3000mg /day of acetaminophen .  - A prescription for anti-nausea medication will be provided in case the narcotic medicine or anesthesia causes nausea - take 1 tablet every 6 hours only if nauseated.  - Take enteric coated aspirin  325 mg once daily for 2 weeks to prevent blood clots.   6. Bandages: May remove after 5 days. If there is no drainage, you may shower. Staples to be removed in ~2 weeks.   7. Physical Therapy: Home health physical therapy to start over the next week. The therapist will provide home exercises. No need for exercises until 1st physical therapy session.   8. Post-Op Appointments:Your first post-op appointment will be in  approximately 2 weeks time.     If you find that they have not been scheduled please call the Orthopaedic Appointment front desk at [phone].

## 2023-10-17 NOTE — Anesthesia Preprocedure Evaluation (Signed)
Anesthesia Evaluation  Patient identified by MRN, date of birth, ID band Patient awake    Reviewed: Allergy & Precautions, H&P , NPO status , Patient's Chart, lab work & pertinent test results, reviewed documented beta blocker date and time   Airway Mallampati: II  TM Distance: >3 FB Neck ROM: full    Dental  (+) Teeth Intact   Pulmonary neg pulmonary ROS   Pulmonary exam normal        Cardiovascular Exercise Tolerance: Poor hypertension, On Medications + CAD and +CHF  Normal cardiovascular exam+ pacemaker  Rate:Normal  PM eval noted. ja   Neuro/Psych  PSYCHIATRIC DISORDERS Anxiety Depression   Dementia TIA Neuromuscular disease    GI/Hepatic Neg liver ROS,GERD  Medicated,,  Endo/Other  diabetesHypothyroidism    Renal/GU Renal disease  negative genitourinary   Musculoskeletal   Abdominal   Peds  Hematology  (+) Blood dyscrasia, anemia   Anesthesia Other Findings   Reproductive/Obstetrics negative OB ROS                             Anesthesia Physical Anesthesia Plan  ASA: 3  Anesthesia Plan: General LMA   Post-op Pain Management:    Induction:   PONV Risk Score and Plan: 4 or greater  Airway Management Planned:   Additional Equipment:   Intra-op Plan:   Post-operative Plan:   Informed Consent: I have reviewed the patients History and Physical, chart, labs and discussed the procedure including the risks, benefits and alternatives for the proposed anesthesia with the patient or authorized representative who has indicated his/her understanding and acceptance.       Plan Discussed with: CRNA  Anesthesia Plan Comments:        Anesthesia Quick Evaluation

## 2023-10-17 NOTE — H&P (Signed)
 Paper H&P to be scanned into permanent record. H&P reviewed. No significant changes noted.

## 2023-10-17 NOTE — Anesthesia Procedure Notes (Signed)
 Procedure Name: Intubation Date/Time: 10/17/2023 2:57 PM  Performed by: Nada Corean CROME, CRNAPre-anesthesia Checklist: Patient identified, Emergency Drugs available, Suction available, Patient being monitored and Timeout performed Patient Re-evaluated:Patient Re-evaluated prior to induction Oxygen Delivery Method: Circle system utilized Preoxygenation: Pre-oxygenation with 100% oxygen Induction Type: IV induction Ventilation: Mask ventilation without difficulty Laryngoscope Size: McGrath and 3 Grade View: Grade I Tube type: Oral Tube size: 6.5 mm Number of attempts: 1 Airway Equipment and Method: Stylet and Video-laryngoscopy Placement Confirmation: ETT inserted through vocal cords under direct vision, positive ETCO2 and breath sounds checked- equal and bilateral Secured at: 20 cm Tube secured with: Tape Dental Injury: Teeth and Oropharynx as per pre-operative assessment  Comments: LMA not seating effectively, inadequate tidal volumes; ETT intubation performed without difficulty.

## 2023-10-17 NOTE — Anesthesia Postprocedure Evaluation (Signed)
 Anesthesia Post Note  Patient: Deborah Obrien  Procedure(s) Performed: Right hip removal of nail hardware with screw exchange (Right: Hip)  Patient location during evaluation: PACU Anesthesia Type: General Level of consciousness: awake and alert, oriented and patient cooperative Pain management: pain level controlled Vital Signs Assessment: post-procedure vital signs reviewed and stable Respiratory status: spontaneous breathing, nonlabored ventilation and respiratory function stable Cardiovascular status: blood pressure returned to baseline and stable Postop Assessment: adequate PO intake Anesthetic complications: no   No notable events documented.   Last Vitals:  Vitals:   10/17/23 1715 10/17/23 1730  BP: 127/61 (!) 134/51  Pulse: 63 60  Resp: 18 (!) 21  Temp:    SpO2: 100% 100%    Last Pain:  Vitals:   10/17/23 1730  TempSrc:   PainSc: 7                  Alfonso Ruths

## 2023-10-17 NOTE — Transfer of Care (Signed)
 Immediate Anesthesia Transfer of Care Note  Patient: Deborah Obrien  Procedure(s) Performed: Right hip removal of nail hardware with screw exchange (Right: Hip)  Patient Location: PACU  Anesthesia Type:General  Level of Consciousness: awake, alert , and oriented  Airway & Oxygen Therapy: Patient Spontanous Breathing and Patient connected to face mask oxygen  Post-op Assessment: Report given to RN, Post -op Vital signs reviewed and stable, and Patient moving all extremities  Post vital signs: Reviewed and stable  Last Vitals:  Vitals Value Taken Time  BP 186/112 10/17/23 1645  Temp 97.1   Pulse 67 10/17/23 1648  Resp 16 10/17/23 1648  SpO2 100 % 10/17/23 1648  Vitals shown include unfiled device data.  Last Pain:  Vitals:   10/17/23 1240  TempSrc: Temporal  PainSc: 4       Patients Stated Pain Goal: 0 (10/17/23 1240)  Complications: No notable events documented.

## 2023-10-18 ENCOUNTER — Emergency Department
Admission: EM | Admit: 2023-10-18 | Discharge: 2023-10-18 | Disposition: A | Discharge status: Skilled Nursing Facility | Payer: 59 | Attending: Emergency Medicine | Admitting: Emergency Medicine

## 2023-10-18 ENCOUNTER — Emergency Department: Payer: 59

## 2023-10-18 ENCOUNTER — Other Ambulatory Visit: Payer: Self-pay

## 2023-10-18 DIAGNOSIS — E1165 Type 2 diabetes mellitus with hyperglycemia: Secondary | ICD-10-CM | POA: Insufficient documentation

## 2023-10-18 DIAGNOSIS — Z794 Long term (current) use of insulin: Secondary | ICD-10-CM | POA: Insufficient documentation

## 2023-10-18 DIAGNOSIS — L7622 Postprocedural hemorrhage and hematoma of skin and subcutaneous tissue following other procedure: Secondary | ICD-10-CM | POA: Diagnosis not present

## 2023-10-18 DIAGNOSIS — I480 Paroxysmal atrial fibrillation: Secondary | ICD-10-CM | POA: Insufficient documentation

## 2023-10-18 DIAGNOSIS — R739 Hyperglycemia, unspecified: Secondary | ICD-10-CM

## 2023-10-18 DIAGNOSIS — R58 Hemorrhage, not elsewhere classified: Secondary | ICD-10-CM | POA: Diagnosis present

## 2023-10-18 DIAGNOSIS — I2510 Atherosclerotic heart disease of native coronary artery without angina pectoris: Secondary | ICD-10-CM | POA: Diagnosis not present

## 2023-10-18 DIAGNOSIS — E039 Hypothyroidism, unspecified: Secondary | ICD-10-CM | POA: Diagnosis not present

## 2023-10-18 DIAGNOSIS — N184 Chronic kidney disease, stage 4 (severe): Secondary | ICD-10-CM | POA: Diagnosis not present

## 2023-10-18 DIAGNOSIS — I130 Hypertensive heart and chronic kidney disease with heart failure and stage 1 through stage 4 chronic kidney disease, or unspecified chronic kidney disease: Secondary | ICD-10-CM | POA: Insufficient documentation

## 2023-10-18 DIAGNOSIS — I509 Heart failure, unspecified: Secondary | ICD-10-CM | POA: Insufficient documentation

## 2023-10-18 DIAGNOSIS — Z7901 Long term (current) use of anticoagulants: Secondary | ICD-10-CM | POA: Diagnosis not present

## 2023-10-18 DIAGNOSIS — E1122 Type 2 diabetes mellitus with diabetic chronic kidney disease: Secondary | ICD-10-CM | POA: Insufficient documentation

## 2023-10-18 DIAGNOSIS — T148XXA Other injury of unspecified body region, initial encounter: Secondary | ICD-10-CM

## 2023-10-18 LAB — URINALYSIS, ROUTINE W REFLEX MICROSCOPIC
Bacteria, UA: NONE SEEN
Bilirubin Urine: NEGATIVE
Glucose, UA: >=500 mg/dL — AB
Hgb urine dipstick: NEGATIVE
Ketones, ur: NEGATIVE mg/dL
Leukocytes,Ua: NEGATIVE
Nitrite: NEGATIVE
Protein, ur: NEGATIVE mg/dL
RBC / HPF: 0 RBC/hpf (ref 0–5)
Specific Gravity, Urine: 1.014 (ref 1.005–1.030)
WBC, UA: 0 WBC/hpf (ref 0–5)
pH: 5 (ref 5.0–8.0)

## 2023-10-18 LAB — CBC
HCT: 33.9 % — ABNORMAL LOW (ref 36.0–46.0)
Hemoglobin: 11 g/dL — ABNORMAL LOW (ref 12.0–15.0)
MCH: 31.7 pg (ref 26.0–34.0)
MCHC: 32.4 g/dL (ref 30.0–36.0)
MCV: 97.7 fL (ref 80.0–100.0)
Platelets: 109 10*3/uL — ABNORMAL LOW (ref 150–400)
RBC: 3.47 MIL/uL — ABNORMAL LOW (ref 3.87–5.11)
RDW: 13.2 % (ref 11.5–15.5)
WBC: 3.9 10*3/uL — ABNORMAL LOW (ref 4.0–10.5)
nRBC: 0 % (ref 0.0–0.2)

## 2023-10-18 LAB — COMPREHENSIVE METABOLIC PANEL
ALT: 24 U/L (ref 0–44)
AST: 39 U/L (ref 15–41)
Albumin: 3.7 g/dL (ref 3.5–5.0)
Alkaline Phosphatase: 102 U/L (ref 38–126)
Anion gap: 11 (ref 5–15)
BUN: 36 mg/dL — ABNORMAL HIGH (ref 8–23)
CO2: 20 mmol/L — ABNORMAL LOW (ref 22–32)
Calcium: 8.5 mg/dL — ABNORMAL LOW (ref 8.9–10.3)
Chloride: 98 mmol/L (ref 98–111)
Creatinine, Ser: 1.47 mg/dL — ABNORMAL HIGH (ref 0.44–1.00)
GFR, Estimated: 35 mL/min — ABNORMAL LOW (ref >60)
Glucose, Bld: 506 mg/dL (ref 70–99)
Potassium: 4.8 mmol/L (ref 3.5–5.1)
Sodium: 129 mmol/L — ABNORMAL LOW (ref 135–145)
Total Bilirubin: 1.2 mg/dL (ref 0.0–1.2)
Total Protein: 6.9 g/dL (ref 6.5–8.1)

## 2023-10-18 LAB — CBG MONITORING, ED
Glucose-Capillary: 461 mg/dL — ABNORMAL HIGH (ref 70–99)
Glucose-Capillary: 459 mg/dL — ABNORMAL HIGH (ref 70–99)
Glucose-Capillary: 308 mg/dL — ABNORMAL HIGH (ref 70–99)

## 2023-10-18 LAB — PROTIME-INR
INR: 1.1 (ref 0.8–1.2)
Prothrombin Time: 14.6 s (ref 11.4–15.2)

## 2023-10-18 MED ORDER — INSULIN ASPART 100 UNIT/ML IJ SOLN
10.0000 [IU] | Freq: Once | INTRAMUSCULAR | Status: AC
  Administered 2023-10-18: 10 [IU] via SUBCUTANEOUS
  Filled 2023-10-18: qty 1

## 2023-10-18 MED ORDER — SODIUM CHLORIDE 0.9 % IV BOLUS
1000.0000 mL | Freq: Once | INTRAVENOUS | Status: AC
  Administered 2023-10-18: 1000 mL via INTRAVENOUS

## 2023-10-18 MED ORDER — INSULIN ASPART 100 UNIT/ML IJ SOLN
8.0000 [IU] | Freq: Once | INTRAMUSCULAR | Status: AC
  Administered 2023-10-18: 8 [IU] via SUBCUTANEOUS
  Filled 2023-10-18: qty 1

## 2023-10-18 MED ORDER — ONDANSETRON HCL 4 MG/2ML IJ SOLN
4.0000 mg | Freq: Once | INTRAMUSCULAR | Status: AC
  Administered 2023-10-18: 4 mg via INTRAVENOUS
  Filled 2023-10-18: qty 2

## 2023-10-18 MED ORDER — TRANEXAMIC ACID-NACL 1000-0.7 MG/100ML-% IV SOLN
1000.0000 mg | Freq: Once | INTRAVENOUS | Status: AC
  Administered 2023-10-18: 1000 mg via INTRAVENOUS
  Filled 2023-10-18: qty 100

## 2023-10-18 MED ORDER — TRAMADOL HCL 50 MG PO TABS
50.0000 mg | ORAL_TABLET | Freq: Once | ORAL | Status: AC
  Administered 2023-10-18: 50 mg via ORAL
  Filled 2023-10-18: qty 1

## 2023-10-18 NOTE — ED Notes (Signed)
This RN with assistance from another RN wrapped pt with ace wrap (hip spica) around right hip. Provider notified and Ok'd wrap. Pt is comfortable.

## 2023-10-18 NOTE — ED Triage Notes (Signed)
 Pt arrived via EMS from [facility] group home for bleeding from post op site. Pt had a hip surgery yesterday - pt takes eliquis  and was restarted on it. Pt woke up in a pool of blood around surgical site. Bleeding controlled on arrival. Pt A/oX4, NAD. VSS.

## 2023-10-18 NOTE — ED Provider Notes (Signed)
 Care of this patient assumed from prior physician at 0700 pending repeat blood glucose after insulin  and disposition. Please see prior physician note for further details.  Briefly this is an 83 year old female who presented with postoperative bleeding.  Stable vital signs.  CT demonstrated intramuscular hematoma.  Case was reviewed with Dr. Aloysius with orthopedics and the hip spica Ace wrap pressure dressing was placed.  Recommendation was to continue Eliquis .  Labs are notable for hyperglycemia without evidence of DKA.  Patient received insulin , but remained hyperglycemic in the 450s.  Additional insulin  was ordered, signed out to me pending repeat blood glucose following this.  If BGL improved, plan for discharge.  Blood glucose did improve to 308.  Discussed results of workup with patient and family.  They are comfortable with discharge home.  Strict return precautions provided.  Patient discharged in stable condition.   Levander Slate, MD 10/18/23 [phone]

## 2023-10-18 NOTE — ED Provider Notes (Signed)
 [facility] Provider Note    Event Date/Time   First MD Initiated Contact with Patient 10/18/23 [id number]     (approximate)   History   Postoperative bleeding   HPI  Deborah Obrien is a 83 y.o. female brought to the ED via EMS from [facility] with a chief complaint of postoperative bleeding.  Patient had outpatient surgery yesterday by Dr. Tobie of orthopedics for right hip removal of nail hardware with screw exchange.  Patient reports it was outpatient surgery and she recovered just fine.  She was cleared to ambulate.  Had Eliquis  held since January 31 and restarted last evening first dose.  Went to bed fine and awoke with pants saturated with blood.  Denies associated chest pain, shortness of breath, nausea, vomiting or dizziness.     Past Medical History   Past Medical History:  Diagnosis Date  . Acute respiratory failure (HCC) 04/09/2018  . Anemia   . Anxiety   . Aortic atherosclerosis (HCC)   . Bowel obstruction (HCC)   . Carotid artery disease (HCC)   . Cerebral microvascular disease   . Chronic heart failure with preserved ejection fraction (HFpEF) (HCC)    a.) TTE 04/10/2018: EF 60-65%, mildly dil LA/RA, mod dil RV, mod TR, PASP ; b.) TTE 01/22/2023: EF 60-65%, no rwma, low-nl RV fxn, RVSP 51.2 mmHg. Mild MR. Mod-sev TR.  . CKD (chronic kidney disease), stage IV (HCC)   . Closed fracture of one rib of right side 05/29/2020  . Colon polyp   . Complete tear of left rotator cuff 10/16/2014  . Coronary artery disease (non-obstructive) 08/25/2016   a.) LHC 08/25/2016:  25% oLM-LM, 20% mLAD, 15% o-pLCx - med mgmt; b.) MV 10/20/2017: no isch/infarct, EF 55-65%  . DDD (degenerative disc disease), cervical    a.) s/p ACDF C6-C7  . Depression   . Diabetic neuropathy (HCC)   . Diarrhea 03/04/2022  . Displaced intertrochanteric fracture of right femur (HCC)   . Fatty liver   . Gastritis without bleeding   . GERD (gastroesophageal reflux  disease)   . GI bleed   . Gout   . Hypertension   . Hypothyroidism   . Long term current use of amiodarone    . Low back pain    history of kyphoplasty t12-l1  . Morbid obesity (HCC)   . Multiple gastric polyps   . 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   a.) presurgical PCR (+) 10/10/2023 prior to REMOVAL OF NAIL HARDWARE WITH SCREW EXCHANGE (RIGHT HIP)  . On apixaban  therapy   . Osteoarthritis   . Osteoporosis   . PAF (paroxysmal atrial fibrillation) (HCC) 2016   a.) CHA2DS2-VASc = 9 (age x 2, sex, HFpEF, HTN, TIA x 2, vascular disease, T2DM) as of 10/13/2023; b.) s/p DCCV 06/28/2017 (120 J x1), 02/06/2018 (150c J x 1), 07/15/2020 (75 J x ); c.) ardiac rate/rhythm maintained on oral amiodarone ; chronically anticoagulated using apixaban   . Presence of permanent cardiac pacemaker 07/01/2015   a.) PPM placed 07/01/2015; b.) device upgraded to dual chamber MDT Azure XT DR MRI SureScan (SN: [id number]) 08/29/2017  . Pulmonary hypertension (HCC)    a.) TTE 04/10/2018: PASP 50 mmHg; b.) TTE 01/22/2023: RVSP 51.2 mmHg  . Sinus node dysfunction (HCC)    a.) s/p PPM placement 06/2015 --> device upgraded 08/2017  . T2DM (type 2 diabetes mellitus) (HCC)   . Tendinitis of upper biceps tendon of left shoulder 11/10/2019  . TIA (transient ischemic attack)   .  Type 2 diabetes mellitus with neurological complications (HCC) 05/02/2018  . Uncontrolled type 2 diabetes mellitus with hypoglycemia, with long-term current use of insulin  (HCC) 03/04/2022  . Urinary incontinence 10/17/2022  . Vascular dementia, moderate, with anxiety [facility])      Active Problem List   Patient Active Problem List   Diagnosis Date Noted  . Acute respiratory failure with hypoxia and hypercapnia (HCC) 02/01/2023  . Chronic kidney disease, stage 4 (severe) (HCC) 02/01/2023  . Paroxysmal atrial fibrillation (HCC) 01/24/2023  . Pulmonary hypertension (HCC) 01/22/2023  . Metabolic acidosis 01/22/2023  . Fall 01/22/2023  .  Transaminitis 01/21/2023  . Hypotension after procedure 01/21/2023  . Shock (HCC) 01/21/2023  . Closed fracture of right hip (HCC) 01/21/2023  . Nausea 01/21/2023  . Chronic heart failure with preserved ejection fraction (HCC) 01/01/2023  . Restless leg syndrome 05/31/2022  . Hypothyroidism 05/30/2022  . Acute renal failure superimposed on stage 4 chronic kidney disease (HCC) 05/30/2022  . Drop in hemoglobin 05/30/2022  . Elevated liver function tests 05/30/2022  . History of gout 05/30/2022  . Humerus fracture 05/29/2022  . Abnormal urinalysis 03/05/2022  . Hypoglycemia 03/04/2022  . 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 Anemia in chronic kidney disease 03/02/2022  . Congestive heart failure (HCC) 03/02/2022  . Osteoporosis 02/13/2020  . Primary osteoarthritis of left shoulder 11/10/2019  . Uses walker 09/27/2018  . UTI (urinary tract infection) 06/14/2018  . Type 2 diabetes mellitus with renal manifestations (HCC) 05/02/2018  . Chronic anticoagulation 04/09/2018  . Cardiac pacemaker in situ 04/09/2018  . H/O TIA (transient ischemic attack) and stroke 04/09/2018  . CAD (coronary artery disease) 04/09/2018  . Sinus node dysfunction (HCC) 08/24/2017  . Dizziness 06/12/2017  . Nephrolithiasis 02/22/2016  . Diabetic neuropathy with neurologic complication (HCC) 11/30/2015  . Recurrent major depressive disorder, in remission (HCC) 08/11/2015  . Polyp of stomach and duodenum 07/29/2015  . Gastric polyp   . Gastro-esophageal reflux disease without esophagitis   . Permanent atrial fibrillation (HCC) 06/29/2015  . Intestinal obstruction (HCC) 02/20/2015  . DDD (degenerative disc disease), lumbar 01/19/2015  . Back pain 01/12/2015  . Gout 01/12/2015  . Intervertebral disc disorder with radiculopathy of lumbar region 09/17/2014  . Lumbar radiculitis 09/17/2014  . Lumbar stenosis with neurogenic claudication 09/17/2014  . Neuritis or  radiculitis due to rupture of lumbar intervertebral disc 09/17/2014  . Atherosclerosis of both carotid arteries 07/15/2014  . Hyperlipemia, mixed 07/15/2014  . 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 Bilateral carotid artery disease (HCC) 07/15/2014  . Body mass index (BMI) of 40.0-44.9 in adult (HCC) 06/21/2014  . Mixed urge and stress incontinence 06/25/2013  . Nocturia 06/25/2013  . Blurring of visual image 04/11/2012  . Other visual disturbances 04/11/2012     Past Surgical History   Past Surgical History:  Procedure Laterality Date  . ABDOMINAL HYSTERECTOMY  1980  . APPENDECTOMY    . BACK SURGERY     2008. plate & screws   . BACK SURGERY  2019   patient describes kyphoplasty for compression fractures, MD referral said fusion  . BREAST BIOPSY Right 04/2015   stereo fibroadenomatous change, neg for atypia  . BREAST EXCISIONAL BIOPSY Left    neg  . CARDIAC CATHETERIZATION Left 08/25/2016   Procedure: Left Heart Cath and Coronary Angiography;  Surgeon: Wolm JINNY Rhyme, MD;  Location: [facility] INVASIVE CV LAB;  Service: Cardiovascular;  Laterality: Left;  . CARDIOVERSION N/A 06/28/2017   Procedure: CARDIOVERSION;  Surgeon: Hester Wolm PARAS, MD;  Location: [facility] ORS;  Service: Cardiovascular;  Laterality: N/A;  . CARDIOVERSION N/A 02/06/2018   Procedure: CARDIOVERSION;  Surgeon: Delford Maude BROCKS, MD;  Location: [facility] ENDOSCOPY;  Service: Cardiovascular;  Laterality: N/A;  . CARDIOVERSION N/A 07/15/2020   Procedure: CARDIOVERSION;  Surgeon: Jeffrie Oneil BROCKS, MD;  Location: [facility] ENDOSCOPY;  Service: Cardiovascular;  Laterality: N/A;  . CATARACT EXTRACTION, BILATERAL  2012  . CHOLECYSTECTOMY    . colon blockage  1999  . COLON SURGERY  2000   removed 20% of colon. colon had collapsed  . COLONOSCOPY  2016   polyps removed 2016  . DORSAL COMPARTMENT RELEASE Left 09/14/2016   Procedure: RELEASE DORSAL COMPARTMENT (DEQUERVAIN);  Surgeon: Lynwood SHAUNNA Hue, MD;  Location: [facility] ORS;  Service:  Orthopedics;  Laterality: Left;  . ESOPHAGOGASTRODUODENOSCOPY N/A 01/27/2015   Procedure: ESOPHAGOGASTRODUODENOSCOPY (EGD);  Surgeon: Gladis RAYMOND Mariner, MD;  Location: [facility] END[facility]COPY;  Service: Endoscopy;  Laterality: N/A;  . ESOPHAGOGASTRODUODENOSCOPY (EGD) WITH PROPOFOL  N/A 07/24/2015   Procedure: ESOPHAGOGASTRODUODENOSCOPY (EGD) WITH PROPOFOL ;  Surgeon: Rogelia Copping, MD;  Location: [facility] ENDOSCOPY;  Service: Endoscopy;  Laterality: N/A;  . ESOPHAGOGASTRODUODENOSCOPY (EGD) WITH PROPOFOL  N/A 04/12/2018   Procedure: ESOPHAGOGASTRODUODENOSCOPY (EGD) WITH PROPOFOL ;  Surgeon: Therisa Bi, MD;  Location[facility] ENDOSCOPY;  Service: Gastroenterology;  Laterality: N/A;  . ESOPHAGOGASTRODUODENOSCOPY (EGD) WITH PROPOFOL  N/A 06/22/2018   Procedure: ESOPHAGOGASTRODUODENOSCOPY (EGD) WITH PROPOFOL ;  Surgeon: Teressa Toribio SHAUNNA, MD;  Location: [facility] ENDOSCOPY;  Service: Endoscopy;  Laterality: N/A;  . ESOPHAGOGASTRODUODENOSCOPY (EGD) WITH PROPOFOL  N/A 07/09/2018   Procedure: ESOPHAGOGASTRODUODENOSCOPY (EGD) WITH PROPOFOL  with resection of gastric polyps;  Surgeon: Therisa Bi, MD;  Location: [facility] ENDOSCOPY;  Service: Gastroenterology;  Laterality: N/A;  . ESOPHAGOGASTRODUODENOSCOPY (EGD) WITH PROPOFOL  N/A 05/17/2019   Procedure: ESOPHAGOGASTRODUODENOSCOPY (EGD) WITH PROPOFOL ;  Surgeon: Therisa Bi, MD;  Location: [facility] ENDOSCOPY;  Service: Gastroenterology;  Laterality: N/A;  . EUS N/A 06/22/2018   Procedure: UPPER ENDOSCOPIC ULTRASOUND (EUS) RADIAL;  Surgeon: Teressa Toribio SHAUNNA, MD;  Location: [facility] ENDOSCOPY;  Service: Endoscopy;  Laterality: N/A;  . EYE SURGERY Bilateral    cataract extractions  . HUMERUS IM NAIL Left 06/02/2022   Procedure: INTRAMEDULLARY (IM) NAIL HUMERAL;  Surgeon: Edie Norleen PARAS, MD;  Location: [facility] ORS;  Service: Orthopedics;  Laterality: Left;  . INTRAMEDULLARY (IM) NAIL INTERTROCHANTERIC Right 01/21/2023   Procedure: INTRAMEDULLARY (IM) NAIL INTERTROCHANTERIC;  Surgeon: Tobie Priest, MD;  Location:  [facility] ORS;  Service: Orthopedics;  Laterality: Right;  . JOINT REPLACEMENT Bilateral 2014   Bilateral Knee replacement  . LEAD REVISION/REPAIR N/A 08/29/2017   Procedure: LEAD REVISION/REPAIR;  Surgeon: Inocencio Soyla Gladis, MD;  Location: [facility] INVASIVE CV LAB;  Service: Cardiovascular;  Laterality: N/A;  . OOPHORECTOMY    . PACEMAKER INSERTION Left 07/01/2015   Procedure: INSERTION PACEMAKER;  Surgeon: Marsa Dooms, MD;  Location: [facility] ORS;  Service: Cardiovascular;  Laterality: Left;  . PACEMAKER REVISION N/A 08/28/2017   Procedure: PACEMAKER REVISION;  Surgeon: Fernande Elspeth BROCKS, MD;  Location: [facility] INVASIVE CV LAB;  Service: Cardiovascular;  Laterality: N/A;  . REPLACEMENT TOTAL KNEE BILATERAL    . SHOULDER ARTHROSCOPY WITH ROTATOR CUFF REPAIR AND OPEN BICEPS TENODESIS Left 11/21/2019   Procedure: LEFT SHOULDER ARTHROSCOPY WITH DEBRIDEMENT, DECOMPRESSION, ROTATOR CUFF REPAIR AND BICEPS TENOLYSIS;  Surgeon: Edie Norleen PARAS, MD;  Location: [facility] ORS;  Service: Orthopedics;  Laterality: Left;  . SHOULDER ARTHROSCOPY WITH SUBACROMIAL DECOMPRESSION Left 2013  . TEE WITHOUT CARDIOVERSION N/A 06/28/2017   Procedure: TRANSESOPHAGEAL ECHOCARDIOGRAM (TEE);  Surgeon: Hester Wolm  J, MD;  Location: [facility] ORS;  Service: Cardiovascular;  Laterality: N/A;  . TUBAL LIGATION       Home Medications   Prior to Admission medications   Medication Sig Start Date End Date Taking? Authorizing Provider  acetaminophen  (TYLENOL ) 325 MG tablet Take 2 tablets (650 mg total) by mouth every 6 (six) hours as needed for mild pain, moderate pain or fever (or Fever >/= 101). 02/01/23   Wieting, Richard, MD  acetaminophen  (TYLENOL ) 500 MG tablet Take 2 tablets (1,000 mg total) by mouth every 8 (eight) hours. 10/17/23 10/16/24  Tobie Priest, MD  allopurinol  (ZYLOPRIM ) 100 MG tablet Take 100 mg by mouth in the morning.    [provider]  alum & mag hydroxide-simeth (MAALOX/MYLANTA) 200-200-20 MG/5ML suspension Take 10  mLs by mouth 3 (three) times daily as needed for indigestion or heartburn.    [provider]  amiodarone  (PACERONE ) 200 MG tablet Take 0.5 tablets (100 mg total) by mouth daily. 07/11/23   Riddle, Suzann, NP  apixaban  (ELIQUIS ) 2.5 MG TABS tablet Take 1 tablet (2.5 mg total) by mouth 2 (two) times daily. 03/06/22   Alexander, Natalie, DO  carboxymethylcellulose (REFRESH PLUS) 0.5 % SOLN 1 drop 3 (three) times daily as needed.    [provider]  Cholecalciferol  (VITAMIN D ) 50 MCG (2000 UT) CAPS Take 2,000 Units by mouth in the morning.    [provider]  docusate sodium  (COLACE) 100 MG capsule Take 100 mg by mouth 2 (two) times daily.    [provider]  estradiol (ESTRACE) 0.1 MG/GM vaginal cream Place 1 Applicatorful vaginally every Tuesday, Thursday, and Saturday at 6 PM.    [provider]  famotidine  (PEPCID ) 10 MG tablet Take 10 mg by mouth in the morning.    [provider]  Fluticasone  Furoate (FLONASE  SENSIMIST NA) Place 1 spray into the nose in the morning.    [provider]  folic acid  (FOLVITE ) 1 MG tablet Take 1 tablet (1 mg total) by mouth daily. 02/02/23   Josette Ade, MD  furosemide  (LASIX ) 20 MG tablet Take 1 tablet (20 mg total) by mouth daily. 02/02/23   Josette Ade, MD  gabapentin  (NEURONTIN ) 300 MG capsule Take 600 mg by mouth 3 (three) times daily. (0800, 1400 & 2000)    [provider]  ibandronate (BONIVA) 150 MG tablet Take 150 mg by mouth every 30 (thirty) days. Take in the morning with a full glass of water , on an empty stomach, and do not take anything else by mouth or lie down for the next 30 min.    [provider]  Insulin  Regular Human (NOVOLIN R FLEXPEN RELION) 100 UNIT/ML KwikPen Inject 0-10 Units into the skin as directed. Check blood sugar 3 times daily and inject insulin  per sliding scale: Less than 70=0 units and eat a snack recheck in 15 minutes; 100-120=2 units 121-150=3  units 151-200=4 units 201-250=5 units 251-300=6 units 301-350=7 units 351-400=8 units 401-450=9 units 451-500=10 units; call MD for reading greater than 500.    [provider]  LANTUS  100 UNIT/ML injection Inject 0.09 mLs (9 Units total) into the skin at bedtime. Patient taking differently: Inject 18 Units into the skin at bedtime. 02/01/23   Josette Ade, MD  levothyroxine  (SYNTHROID ) 75 MCG tablet Take 37.5 mcg by mouth daily before breakfast. (0600)    [provider]  magnesium  hydroxide (MILK OF MAGNESIA) 400 MG/5ML suspension Take 30 mLs by mouth daily as needed (constipation).  [provider]  midodrine  (PROAMATINE ) 5 MG tablet Take 5 mg by mouth 3 (three) times daily with meals. (0730, 1130 & 1600) DO NOT LIE DOWN AFTER TAKING, DO NOT TAKE AFTER EVENING MEAL. TAKE LAST DOSE OF THE DAY AT LEAST 4 HOURS BEFORE BEDTIME    [provider]  Multiple Vitamin (MULTIVITAMIN WITH MINERALS) TABS tablet Take 1 tablet by mouth daily. 02/02/23   Josette Ade, MD  nystatin  cream (MYCOSTATIN ) Apply 1 Application topically 2 (two) times daily as needed (rash).    [provider]  nystatin  powder Apply 1 Application topically in the morning and at bedtime. (0800 & 2000) Apply topically to groin    [provider]  ondansetron  (ZOFRAN -ODT) 4 MG disintegrating tablet Take 1 tablet (4 mg total) by mouth every 8 (eight) hours as needed for nausea or vomiting. 10/17/23   Tobie Priest, MD  Polyethyl Glycol-Propyl Glycol 0.4-0.3 % SOLN Place 1 drop into both eyes See admin instructions. Instill 1 drop into both eye scheduled twice daily (0800 & 2000) & may instill 1 drop into both eyes twice daily if needed for itching eyes.    [provider]  polyethylene glycol (MIRALAX  / GLYCOLAX ) 17 g packet Take 17 g by mouth daily. 02/01/23   Josette Ade, MD  rOPINIRole  (REQUIP ) 0.5 MG tablet Take 0.5 mg by mouth at bedtime. (2000)    [provider]  senna (SENOKOT) 8.6 MG tablet Take 1 tablet by mouth at bedtime.    [provider]  sertraline  (ZOLOFT ) 100 MG tablet Take 100 mg by mouth in the morning. (0800) 100 mg + 25 mg=125 mg    [provider]  sertraline  (ZOLOFT ) 25 MG tablet Take 25 mg by mouth in the morning. (0800) 25 mg + 100 mg=125 mg    [provider]  solifenacin (VESICARE) 5 MG tablet Take 5 mg by mouth in the morning. (0800)    [provider]  thiamine  (VITAMIN B-1) 100 MG tablet Take 1 tablet (100 mg total) by mouth daily. 02/02/23   Josette Ade, MD  traMADol  (ULTRAM ) 50 MG tablet Take 1 tablet (50 mg total) by mouth every 6 (six) hours as needed for moderate pain. Patient taking differently: Take 50 mg by mouth 2 (two) times daily as needed (pain.). 01/26/23   Verlinda Boas, PA-C  traMADol  (ULTRAM ) 50 MG tablet Take 1 tablet (50 mg total) by mouth every 4 (four) hours as needed for severe pain (pain score 7-10) or moderate pain (pain score 4-6). 10/17/23 10/16/24  Tobie Priest, MD  vitamin B-12 (CYANOCOBALAMIN ) 1000 MCG tablet Take 1,000 mcg by mouth in the morning.    [provider]     Allergies  Fish allergy, Macrolides and ketolides, Meperidine, Other, Prednisone, Shellfish allergy, Sulfa antibiotics, Sulfacetamide sodium, Telbivudine, Uloric [febuxostat], Aspirin , Celecoxib, Cephalexin, Codeine, Dilaudid  [hydromorphone  hcl], Erythromycin, Iodinated contrast media, Lipitor [atorvastatin calcium ], Oxycodone , Aleve [naproxen], Atorvastatin, Cephalosporins, Doxycycline, Iodine, Motrin [ibuprofen], Tape, and Valdecoxib   Family History   Family History  Problem Relation Age of Onset  . Diabetes Mellitus II Mother   . CAD Mother   . Heart attack Mother   . Cancer Father        skin  . Diabetes Mellitus II Brother   . Stroke Brother   . Breast cancer Neg Hx      Physical Exam  Triage Vital Signs: ED Triage Vitals  Encounter Vitals Group     BP  Systolic BP Percentile      Diastolic BP Percentile      Pulse      Resp      Temp      Temp src      SpO2      Weight      Height      Head Circumference      Peak Flow      Pain Score      Pain Loc      Pain Education      Exclude from Growth Chart     Updated Vital Signs: BP (!) 117/54   Pulse 65   Temp (!) 97.5 F (36.4 C) (Oral)   Resp 20   Ht 5' 3 (1.6 m)   Wt 70.5 kg   SpO2 100%   BMI 27.53 kg/m    General: Awake, no distress.  CV:  RRR.  Good peripheral perfusion.  Resp:  Normal effort.  CTAB. Abd:  Nontender.  No distention.  Other:  Right hip postop bandage saturated with blood.  Bandage and honeycomb removed to reveal clean incision, staples intact, expressed site without bleeding.  Patient able to move RLE freely.  2+ distal pulses.  Brisk, less than 5-second capillary refill.   ED Results / Procedures / Treatments  Labs (all labs ordered are listed, but only abnormal results are displayed) Labs Reviewed  CBC - Abnormal; Notable for the following components:      Result Value   WBC 3.9 (*)    RBC 3.47 (*)    Hemoglobin 11.0 (*)    HCT 33.9 (*)    Platelets 109 (*)    All other components within normal limits  COMPREHENSIVE METABOLIC PANEL - Abnormal; Notable for the following components:   Sodium 129 (*)    CO2 20 (*)    Glucose, Bld 506 (*)    BUN 36 (*)    Creatinine, Ser 1.47 (*)    Calcium  8.5 (*)    GFR, Estimated 35 (*)    All other components within normal limits  CBG MONITORING, ED - Abnormal; Notable for the following components:   Glucose-Capillary 461 (*)    All other components within normal limits  PROTIME-INR  URINALYSIS, ROUTINE W REFLEX MICROSCOPIC     EKG  None   RADIOLOGY I have independently visualized and interpreted patient's imaging study as well as noted the radiology interpretation:  CT right hip: 5.9 x 8.5 cm hematoma  Official radiology report(s): CT Hip Right Wo Contrast Result Date:  10/18/2023 CLINICAL DATA:  Postop bleeding. Postoperative day 1 from hardware retrieval and placement of cephalomedullary lag screw and compression screw EXAM: CT OF THE RIGHT HIP WITHOUT CONTRAST TECHNIQUE: Multidetector CT imaging of the right hip was performed according to the standard protocol. Multiplanar CT image reconstructions were also generated. RADIATION DOSE REDUCTION: This exam was performed according to the departmental dose-optimization program which includes automated exposure control, adjustment of the mA and/or kV according to patient size and/or use of iterative reconstruction technique. COMPARISON:  X-rays from 10/17/2023 and 04/22/2023. FINDINGS: Bones/Joint/Cartilage Healed femoral neck fracture with fixation hardware in situ. No evidence for acute fracture or hardware complication. Ligaments Suboptimally assessed by CT. Muscles and Tendons Intramuscular hematoma noted in the anterior compartment, mainly involving the vastus intermedius and vastus lateralis. Given unilateral imaging on this study, the extent of the hematoma is difficult to assess given the lack of a contralateral normal comparison side. However based on qualitative  appearance, hematoma measures approximately 5.9 x 3.3 x 8.5 cm and is generally positioned anterior and lateral to the femur. Soft tissues Edema and gas is identified deep to the lateral skin staple lines compatible with surgery yesterday. There is generalized edema in the subcutaneous fat overlying the right hip and proximal femur compatible with recent surgery. IMPRESSION: 1. Intramuscular hematoma in the anterior compartment, mainly involving the vastus intermedius and vastus lateralis. Given unilateral imaging on this study, the extent of the hematoma is difficult to assess given the lack of a contralateral normal comparison side. However based on qualitative appearance, hematoma measures approximately 5.9 x 3.3 x 8.5 cm and is generally positioned anterior and  lateral to the femur. 2. Healed femoral neck fracture with fixation hardware in situ. No evidence for acute fracture or hardware complication. Electronically Signed   By: Camellia Candle M.D.   On: 10/18/2023 05:32   DG HIP UNILAT WITH PELVIS 2-3 VIEWS RIGHT Result Date: 10/17/2023 CLINICAL DATA:  Elective surgery. Removal of nail hardware from proximal right femur. EXAM: DG HIP (WITH OR WITHOUT PELVIS) 2-3V RIGHT COMPARISON:  Right hip radiographs 04/22/2023 FINDINGS: Images were performed intraoperatively without the presence of a radiologist. On initial image, 1 of the two proximal right femoral neck screws is no longer visualized. The other appears to be in the process of being removed. On the system images there are again two screws within the right femoral neck. Total fluoroscopy images: 3 Total fluoroscopy time: 103 seconds Total dose: Radiation Exposure Index (as provided by the fluoroscopic device): 17.17 mGy air Kerma Please see intraoperative findings for further detail. IMPRESSION: Intraoperative fluoroscopy for hardware removal. Electronically Signed   By: Tanda Lyons M.D.   On: 10/17/2023 16:37   DG C-Arm 1-60 Min-No Report Result Date: 10/17/2023 Fluoroscopy was utilized by the requesting physician.  No radiographic interpretation.   DG C-Arm 1-60 Min-No Report Result Date: 10/17/2023 Fluoroscopy was utilized by the requesting physician.  No radiographic interpretation.     PROCEDURES:  Critical Care performed: No  .1-3 Lead EKG Interpretation  Performed by: Robinette Vermell PARAS, MD Authorized by: Robinette Vermell PARAS, MD     Interpretation: normal     ECG rate:  65   ECG rate assessment: normal     Rhythm: sinus rhythm     Ectopy: none     Conduction: normal   Comments:     Patient placed on cardiac monitor to evaluate for arrhythmias    MEDICATIONS ORDERED IN ED: Medications  sodium chloride  0.9 % bolus 1,000 mL (1,000 mLs Intravenous New Bag/Given 10/18/23 0446)  tranexamic acid   (CYKLOKAPRON ) IVPB 1,000 mg (0 mg Intravenous Stopped 10/18/23 0453)  traMADol  (ULTRAM ) tablet 50 mg (50 mg Oral Given 10/18/23 0446)  insulin  aspart (novoLOG ) injection 10 Units (10 Units Subcutaneous Given 10/18/23 0538)  ondansetron  (ZOFRAN ) injection 4 mg (4 mg Intravenous Given 10/18/23 0538)     IMPRESSION / MDM / ASSESSMENT AND PLAN / ED COURSE  I reviewed the triage vital signs and the nursing notes.                             83 year old female presenting with postoperative bleeding.  Differential diagnosis includes but is not limited to coagulopathy, hematoma, seroma, arterial hemorrhage, etc.  I personally reviewed patient's records and note patient's operative note from yesterday.  Patient's presentation is most consistent with acute complicated illness / injury requiring diagnostic workup.  The patient is on the cardiac monitor to evaluate for evidence of arrhythmia and/or significant heart rate changes.  EMS reports glucose greater than 500.  Will obtain basic lab work, coag panel, CT right hip.  Administer IV TXA, Tramadol  for pain, IV fluids and reassess.  Clinical Course as of 10/19/23 0548  Wed Oct 18, 2023  0457 Laboratory results notable for hyperglycemia without elevation of anion gap.  IV fluids infusing, will add subcu insulin  and recheck blood sugar.  Will obtain urinalysis.  Awaiting CT scan results.  Patient ambulated to commode with little difficulty; no bleeding from postoperative site. [JS]  0546 Hematoma demonstrated on CT hip.  Discussed case with [facility] orthopedics on-call Dr. Lorelle who recommends hip spica Ace wrap pressure dressing, continue Eliquis  to prevent blood clots.  Patient may be discharged back to her facility with close follow-up with Dr. Tobie.  Updated patient on lab work, imaging study and plan of care.  Patient agreeable. [JS]  0647 Hip spica dressing in place, patient up and down to bedside commode without difficulty.  No bleeding through dressing. [JS]   0649 Blood sugar 459.  Will give additional 8 units subcu insulin  and recheck blood sugar.  If it is trending down nicely, anticipate patient may be discharged back to her facility.  Care will be transferred to the oncoming provider at change of shift. [JS]    Clinical Course User Index [JS] Robinette Vermell PARAS, MD     FINAL CLINICAL IMPRESSION(S) / ED DIAGNOSES   Final diagnoses:  Bleeding  Hyperglycemia  Hematoma     Rx / DC Orders   ED Discharge Orders     None        Note:  This document was prepared using Dragon voice recognition software and may include unintentional dictation errors.   Treven Holtman J, MD 10/19/23 [phone]

## 2023-10-18 NOTE — Discharge Instructions (Signed)
 Keep dressing in place.  Continue Eliquis .  Call Dr. Basilio Both office to schedule follow-up appointment 1 to 2 days.  Return to the ER for recurrent or worsening symptoms, persistent vomiting, difficulty breathing, feeling faint or other concerns.

## 2023-10-18 NOTE — ED Notes (Signed)
Attempted to call son per pt request. No answer at this time

## 2023-10-20 ENCOUNTER — Encounter: Payer: Self-pay | Admitting: Orthopedic Surgery

## 2023-10-27 ENCOUNTER — Inpatient Hospital Stay
Admission: EM | Admit: 2023-10-27 | Discharge: 2023-11-06 | DRG: 919 | Disposition: A | Discharge status: Skilled Nursing Facility | Payer: 59 | Source: Skilled Nursing Facility | Attending: Internal Medicine | Admitting: Internal Medicine

## 2023-10-27 DIAGNOSIS — Z888 Allergy status to other drugs, medicaments and biological substances status: Secondary | ICD-10-CM

## 2023-10-27 DIAGNOSIS — K760 Fatty (change of) liver, not elsewhere classified: Secondary | ICD-10-CM | POA: Diagnosis present

## 2023-10-27 DIAGNOSIS — E663 Overweight: Secondary | ICD-10-CM | POA: Diagnosis present

## 2023-10-27 DIAGNOSIS — Z79890 Hormone replacement therapy (postmenopausal): Secondary | ICD-10-CM

## 2023-10-27 DIAGNOSIS — S7001XA Contusion of right hip, initial encounter: Secondary | ICD-10-CM | POA: Diagnosis present

## 2023-10-27 DIAGNOSIS — Z8673 Personal history of transient ischemic attack (TIA), and cerebral infarction without residual deficits: Secondary | ICD-10-CM

## 2023-10-27 DIAGNOSIS — I2510 Atherosclerotic heart disease of native coronary artery without angina pectoris: Secondary | ICD-10-CM | POA: Diagnosis present

## 2023-10-27 DIAGNOSIS — R578 Other shock: Secondary | ICD-10-CM | POA: Insufficient documentation

## 2023-10-27 DIAGNOSIS — Z79818 Long term (current) use of other agents affecting estrogen receptors and estrogen levels: Secondary | ICD-10-CM

## 2023-10-27 DIAGNOSIS — I272 Pulmonary hypertension, unspecified: Secondary | ICD-10-CM | POA: Diagnosis present

## 2023-10-27 DIAGNOSIS — Z8249 Family history of ischemic heart disease and other diseases of the circulatory system: Secondary | ICD-10-CM

## 2023-10-27 DIAGNOSIS — I5022 Chronic systolic (congestive) heart failure: Secondary | ICD-10-CM | POA: Diagnosis present

## 2023-10-27 DIAGNOSIS — F01B0 Vascular dementia, moderate, without behavioral disturbance, psychotic disturbance, mood disturbance, and anxiety: Secondary | ICD-10-CM | POA: Diagnosis present

## 2023-10-27 DIAGNOSIS — N1831 Chronic kidney disease, stage 3a: Secondary | ICD-10-CM | POA: Insufficient documentation

## 2023-10-27 DIAGNOSIS — E1122 Type 2 diabetes mellitus with diabetic chronic kidney disease: Secondary | ICD-10-CM | POA: Diagnosis present

## 2023-10-27 DIAGNOSIS — J121 Respiratory syncytial virus pneumonia: Secondary | ICD-10-CM | POA: Insufficient documentation

## 2023-10-27 DIAGNOSIS — K219 Gastro-esophageal reflux disease without esophagitis: Secondary | ICD-10-CM | POA: Diagnosis present

## 2023-10-27 DIAGNOSIS — Z79899 Other long term (current) drug therapy: Secondary | ICD-10-CM

## 2023-10-27 DIAGNOSIS — Z6828 Body mass index (BMI) 28.0-28.9, adult: Secondary | ICD-10-CM

## 2023-10-27 DIAGNOSIS — Z886 Allergy status to analgesic agent status: Secondary | ICD-10-CM

## 2023-10-27 DIAGNOSIS — Z882 Allergy status to sulfonamides status: Secondary | ICD-10-CM

## 2023-10-27 DIAGNOSIS — E1165 Type 2 diabetes mellitus with hyperglycemia: Secondary | ICD-10-CM | POA: Diagnosis present

## 2023-10-27 DIAGNOSIS — M109 Gout, unspecified: Secondary | ICD-10-CM | POA: Diagnosis present

## 2023-10-27 DIAGNOSIS — Z91041 Radiographic dye allergy status: Secondary | ICD-10-CM

## 2023-10-27 DIAGNOSIS — I959 Hypotension, unspecified: Secondary | ICD-10-CM | POA: Diagnosis present

## 2023-10-27 DIAGNOSIS — Z809 Family history of malignant neoplasm, unspecified: Secondary | ICD-10-CM

## 2023-10-27 DIAGNOSIS — L7632 Postprocedural hematoma of skin and subcutaneous tissue following other procedure: Secondary | ICD-10-CM | POA: Diagnosis not present

## 2023-10-27 DIAGNOSIS — Z950 Presence of cardiac pacemaker: Secondary | ICD-10-CM

## 2023-10-27 DIAGNOSIS — Z96653 Presence of artificial knee joint, bilateral: Secondary | ICD-10-CM | POA: Diagnosis present

## 2023-10-27 DIAGNOSIS — E1149 Type 2 diabetes mellitus with other diabetic neurological complication: Secondary | ICD-10-CM | POA: Diagnosis present

## 2023-10-27 DIAGNOSIS — E871 Hypo-osmolality and hyponatremia: Secondary | ICD-10-CM | POA: Insufficient documentation

## 2023-10-27 DIAGNOSIS — D509 Iron deficiency anemia, unspecified: Secondary | ICD-10-CM | POA: Diagnosis present

## 2023-10-27 DIAGNOSIS — D649 Anemia, unspecified: Secondary | ICD-10-CM

## 2023-10-27 DIAGNOSIS — Z794 Long term (current) use of insulin: Secondary | ICD-10-CM

## 2023-10-27 DIAGNOSIS — J9601 Acute respiratory failure with hypoxia: Secondary | ICD-10-CM | POA: Diagnosis present

## 2023-10-27 DIAGNOSIS — Z91013 Allergy to seafood: Secondary | ICD-10-CM

## 2023-10-27 DIAGNOSIS — Z91018 Allergy to other foods: Secondary | ICD-10-CM

## 2023-10-27 DIAGNOSIS — D62 Acute posthemorrhagic anemia: Secondary | ICD-10-CM | POA: Diagnosis present

## 2023-10-27 DIAGNOSIS — Z823 Family history of stroke: Secondary | ICD-10-CM

## 2023-10-27 DIAGNOSIS — E039 Hypothyroidism, unspecified: Secondary | ICD-10-CM | POA: Diagnosis present

## 2023-10-27 DIAGNOSIS — T148XXA Other injury of unspecified body region, initial encounter: Secondary | ICD-10-CM | POA: Diagnosis not present

## 2023-10-27 DIAGNOSIS — I130 Hypertensive heart and chronic kidney disease with heart failure and stage 1 through stage 4 chronic kidney disease, or unspecified chronic kidney disease: Secondary | ICD-10-CM | POA: Diagnosis present

## 2023-10-27 DIAGNOSIS — E875 Hyperkalemia: Secondary | ICD-10-CM | POA: Insufficient documentation

## 2023-10-27 DIAGNOSIS — Z1152 Encounter for screening for COVID-19: Secondary | ICD-10-CM

## 2023-10-27 DIAGNOSIS — Z7901 Long term (current) use of anticoagulants: Secondary | ICD-10-CM

## 2023-10-27 DIAGNOSIS — Z885 Allergy status to narcotic agent status: Secondary | ICD-10-CM

## 2023-10-27 DIAGNOSIS — N184 Chronic kidney disease, stage 4 (severe): Secondary | ICD-10-CM | POA: Diagnosis present

## 2023-10-27 DIAGNOSIS — J205 Acute bronchitis due to respiratory syncytial virus: Secondary | ICD-10-CM | POA: Diagnosis present

## 2023-10-27 DIAGNOSIS — Y838 Other surgical procedures as the cause of abnormal reaction of the patient, or of later complication, without mention of misadventure at the time of the procedure: Secondary | ICD-10-CM | POA: Diagnosis present

## 2023-10-27 DIAGNOSIS — Z881 Allergy status to other antibiotic agents status: Secondary | ICD-10-CM

## 2023-10-27 DIAGNOSIS — E1151 Type 2 diabetes mellitus with diabetic peripheral angiopathy without gangrene: Secondary | ICD-10-CM | POA: Diagnosis present

## 2023-10-27 DIAGNOSIS — M810 Age-related osteoporosis without current pathological fracture: Secondary | ICD-10-CM | POA: Diagnosis present

## 2023-10-27 DIAGNOSIS — I480 Paroxysmal atrial fibrillation: Secondary | ICD-10-CM | POA: Diagnosis present

## 2023-10-27 DIAGNOSIS — Z833 Family history of diabetes mellitus: Secondary | ICD-10-CM

## 2023-10-27 DIAGNOSIS — Z860100 Personal history of colon polyps, unspecified: Secondary | ICD-10-CM

## 2023-10-27 DIAGNOSIS — Z66 Do not resuscitate: Secondary | ICD-10-CM | POA: Diagnosis present

## 2023-10-27 DIAGNOSIS — Z91048 Other nonmedicinal substance allergy status: Secondary | ICD-10-CM

## 2023-10-27 DIAGNOSIS — Z5986 Financial insecurity: Secondary | ICD-10-CM

## 2023-10-27 MED ORDER — MORPHINE SULFATE (PF) 4 MG/ML IV SOLN
4.0000 mg | Freq: Once | INTRAVENOUS | Status: AC
  Administered 2023-10-28: 4 mg via INTRAVENOUS
  Filled 2023-10-27: qty 1

## 2023-10-27 MED ORDER — ONDANSETRON HCL 4 MG/2ML IJ SOLN
4.0000 mg | Freq: Once | INTRAMUSCULAR | Status: AC
  Administered 2023-10-28: 4 mg via INTRAVENOUS
  Filled 2023-10-27: qty 2

## 2023-10-27 NOTE — ED Triage Notes (Signed)
Pt arrived via EMS from [facility]. Pt is complaining of right hip pain. Pt is 2 days post op from an operation on the right hip.

## 2023-10-27 NOTE — ED Provider Notes (Signed)
[facility] Provider Note    Event Date/Time   First MD Initiated Contact with Patient 10/27/23 2305     (approximate)   History   No chief complaint on file.   HPI  Deborah Obrien is a 83 y.o. female with history of hypotension on midodrine, CHF status post pacemaker, CKD, CAD, hypertension, diabetes, atrial fibrillation on Eliquis who presents to the emergency department from her nursing facility with EMS with complaints of severe right hip pain.  Patient underwent removal of right hip hardware and placement of a cephalomedullary lag screw and compression screw by Dr. Allena Katz on 10/17/23.  Patient had to return to the emergency department with postoperative bleeding on 10/18/2023.  Was found to have a 5.9 x 8.5 x 3.3 cm hematoma on CT scan.  Patient was hemodynamically stable and sent back to her nursing home.  She reports she was walking with her walker when she felt sudden discomfort in her hip.  No longer able to bear weight.  She did not fall to the ground.  No bleeding from her incision sites.  No known fevers.  She reports she feels like her entire right leg feels numb.  Complains of some lower back pain.  No bowel or bladder incontinence.  No calf tenderness or calf swelling.  Denies any chest pain or shortness of breath.   History provided by patient, EMS.    Past Medical History:  Diagnosis Date   Acute respiratory failure (HCC) 04/09/2018   Anemia    Anxiety    Aortic atherosclerosis (HCC)    Bowel obstruction (HCC)    Carotid artery disease (HCC)    Cerebral microvascular disease    Chronic heart failure with preserved ejection fraction (HFpEF) (HCC)    a.) TTE 04/10/2018: EF 60-65%, mildly dil LA/RA, mod dil RV, mod TR, PASP ; b.) TTE 01/22/2023: EF 60-65%, no rwma, low-nl RV fxn, RVSP 51.2 mmHg. Mild MR. Mod-sev TR.   CKD (chronic kidney disease), stage IV (HCC)    Closed fracture of one rib of right side 05/29/2020   Colon polyp     Complete tear of left rotator cuff 10/16/2014   Coronary artery disease (non-obstructive) 08/25/2016   a.) LHC 08/25/2016:  25% oLM-LM, 20% mLAD, 15% o-pLCx - med mgmt; b.) MV 10/20/2017: no isch/infarct, EF 55-65%   DDD (degenerative disc disease), cervical    a.) s/p ACDF C6-C7   Depression    Diabetic neuropathy (HCC)    Diarrhea 03/04/2022   Displaced intertrochanteric fracture of right femur (HCC)    Fatty liver    Gastritis without bleeding    GERD (gastroesophageal reflux disease)    GI bleed    Gout    Hypertension    Hypothyroidism    Long term current use of amiodarone    Low back pain    history of kyphoplasty t12-l1   Morbid obesity (HCC)    Multiple gastric polyps    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   a.) presurgical PCR (+) 10/10/2023 prior to REMOVAL OF NAIL HARDWARE WITH SCREW EXCHANGE (RIGHT HIP)   On apixaban therapy    Osteoarthritis    Osteoporosis    PAF (paroxysmal atrial fibrillation) (HCC) 2016   a.) CHA2DS2-VASc = 9 (age x 2, sex, HFpEF, HTN, TIA x 2, vascular disease, T2DM) as of 10/13/2023; b.) s/p DCCV 06/28/2017 (120 J x1), 02/06/2018 (150c J x 1), 07/15/2020 (75 J x ); c.) ardiac rate/rhythm maintained on oral amiodarone;  chronically anticoagulated using apixaban   Presence of permanent cardiac pacemaker 07/01/2015   a.) PPM placed 07/01/2015; b.) device upgraded to dual chamber MDT Azure XT DR MRI SureScan (SN: [id number]) 08/29/2017   Pulmonary hypertension (HCC)    a.) TTE 04/10/2018: PASP 50 mmHg; b.) TTE 01/22/2023: RVSP 51.2 mmHg   Sinus node dysfunction (HCC)    a.) s/p PPM placement 06/2015 --> device upgraded 08/2017   T2DM (type 2 diabetes mellitus) (HCC)    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 plate & screws    BACK SURGERY  2019   patient describes kyphoplasty for compression fractures, MD referral said fusion   BREAST BIOPSY Right 04/2015   stereo fibroadenomatous change, neg for atypia   BREAST EXCISIONAL BIOPSY Left    neg   CARDIAC CATHETERIZATION Left 08/25/2016   Procedure: Left Heart Cath and Coronary Angiography;  Surgeon: Lamar Blinks, MD;  Location: [facility] INVASIVE CV LAB;  Service: Cardiovascular;  Laterality: Left;   CARDIOVERSION N/A 06/28/2017   Procedure: CARDIOVERSION;  Surgeon: Lamar Blinks, MD;  Location: [facility] ORS;  Service: Cardiovascular;  Laterality: N/A;   CARDIOVERSION N/A 02/06/2018   Procedure: CARDIOVERSION;  Surgeon: Wendall Stade, MD;  Location: [facility] ENDOSCOPY;  Service: Cardiovascular;  Laterality: N/A;   CARDIOVERSION N/A 07/15/2020   Procedure: CARDIOVERSION;  Surgeon: Jake Bathe, MD;  Location: [facility] ENDOSCOPY;  Service: Cardiovascular;  Laterality: N/A;   CATARACT EXTRACTION, BILATERAL  2012   CHOLECYSTECTOMY     colon blockage  1999   COLON SURGERY  2000   removed 20% of colon. colon had collapsed   COLONOSCOPY  2016   polyps removed 2016   DORSAL COMPARTMENT RELEASE Left 09/14/2016   Procedure: RELEASE DORSAL COMPARTMENT (DEQUERVAIN);  Surgeon: Donato Heinz, MD;  Location: [facility] ORS;  Service: Orthopedics;  Laterality: Left;   ESOPHAGOGASTRODUODENOSCOPY N/A 01/27/2015   Procedure: ESOPHAGOGASTRODUODENOSCOPY (EGD);  Surgeon: Christena Deem, MD;  Location: [facility] ENDOSCOPY;  Service: Endoscopy;  Laterality: N/A;   ESOPHAGOGASTRODUODENOSCOPY (EGD) WITH PROPOFOL N/A 07/24/2015   Procedure: ESOPHAGOGASTRODUODENOSCOPY (EGD) WITH PROPOFOL;  Surgeon: Midge Minium, MD;  Location: [facility] ENDOSCOPY;  Service: Endoscopy;   Laterality: N/A;   ESOPHAGOGASTRODUODENOSCOPY (EGD) WITH PROPOFOL N/A 04/12/2018   Procedure: ESOPHAGOGASTRODUODENOSCOPY (EGD) WITH PROPOFOL;  Surgeon: Wyline Mood, MD;  Location: [facility] ENDOSCOPY;  Service: Gastroenterology;  Laterality: N/A;   ESOPHAGOGASTRODUODENOSCOPY (EGD) WITH PROPOFOL N/A 06/22/2018   Procedure: ESOPHAGOGASTRODUODENOSCOPY (EGD) WITH PROPOFOL;  Surgeon: Rachael Fee, MD;  Location: [facility] ENDOSCOPY;  Service: Endoscopy;  Laterality: N/A;   ESOPHAGOGASTRODUODENOSCOPY (EGD) WITH PROPOFOL N/A 07/09/2018   Procedure: ESOPHAGOGASTRODUODENOSCOPY (EGD) WITH PROPOFOL with resection of gastric polyps;  Surgeon: Wyline Mood, MD;  Location: [facility] ENDOSCOPY;  Service: Gastroenterology;  Laterality: N/A;   ESOPHAGOGASTRODUODENOSCOPY (EGD) WITH PROPOFOL N/A 05/17/2019   Procedure: ESOPHAGOGASTRODUODENOSCOPY (EGD) WITH PROPOFOL;  Surgeon: Wyline Mood, MD;  Location: [facility] ENDOSCOPY;  Service: Gastroenterology;  Laterality: N/A;   EUS N/A 06/22/2018   Procedure: UPPER ENDOSCOPIC ULTRASOUND (EUS) RADIAL;  Surgeon: Rachael Fee, MD;  Location: [facility] ENDOSCOPY;  Service: Endoscopy;  Laterality: N/A;   EYE SURGERY Bilateral    cataract extractions   HARDWARE REMOVAL Right 10/17/2023   Procedure: Right hip removal of nail hardware with screw exchange;  Surgeon: Signa Kell, MD;  Location: [facility] ORS;  Service: Orthopedics;  Laterality: Right;   HUMERUS IM NAIL Left 06/02/2022   Procedure: INTRAMEDULLARY (IM) NAIL HUMERAL;  Surgeon: Christena Flake, MD;  Location: [facility] ORS;  Service: Orthopedics;  Laterality: Left;   INTRAMEDULLARY (IM) NAIL INTERTROCHANTERIC Right 01/21/2023   Procedure: INTRAMEDULLARY (IM) NAIL INTERTROCHANTERIC;  Surgeon: Signa Kell, MD;  Location: [facility] ORS;  Service: Orthopedics;  Laterality: Right;   JOINT REPLACEMENT Bilateral 2014   Bilateral Knee replacement   LEAD REVISION/REPAIR N/A 08/29/2017   Procedure: LEAD REVISION/REPAIR;  Surgeon: Regan Lemming, MD;   Location: [facility] INVASIVE CV LAB;  Service: Cardiovascular;  Laterality: N/A;   OOPHORECTOMY     PACEMAKER INSERTION Left 07/01/2015   Procedure: INSERTION PACEMAKER;  Surgeon: Marcina Millard, MD;  Location: [facility] ORS;  Service: Cardiovascular;  Laterality: Left;   PACEMAKER REVISION N/A 08/28/2017   Procedure: PACEMAKER REVISION;  Surgeon: Duke Salvia, MD;  Location: [facility] INVASIVE CV LAB;  Service: Cardiovascular;  Laterality: N/A;   REPLACEMENT TOTAL KNEE BILATERAL     SHOULDER ARTHROSCOPY WITH ROTATOR CUFF REPAIR AND OPEN BICEPS TENODESIS Left 11/21/2019   Procedure: LEFT SHOULDER ARTHROSCOPY WITH DEBRIDEMENT, DECOMPRESSION, ROTATOR CUFF REPAIR AND BICEPS TENOLYSIS;  Surgeon: Christena Flake, MD;  Location: [facility] ORS;  Service: Orthopedics;  Laterality: Left;   SHOULDER ARTHROSCOPY WITH SUBACROMIAL DECOMPRESSION Left 2013   TEE WITHOUT CARDIOVERSION N/A 06/28/2017   Procedure: TRANSESOPHAGEAL ECHOCARDIOGRAM (TEE);  Surgeon: Lamar Blinks, MD;  Location: [facility] ORS;  Service: Cardiovascular;  Laterality: N/A;   TUBAL LIGATION      MEDICATIONS:  Prior to Admission medications   Medication Sig Start Date End Date Taking? Authorizing Provider  acetaminophen (TYLENOL) 325 MG tablet Take 2 tablets (650 mg total) by mouth every 6 (six) hours as needed for mild pain, moderate pain or fever (or Fever >/= 101). 02/01/23   Wieting, Richard, MD  acetaminophen (TYLENOL) 500 MG tablet Take 2 tablets (1,000 mg total) by mouth every 8 (eight) hours. 10/17/23 10/16/24  Signa Kell, MD  allopurinol (ZYLOPRIM) 100 MG tablet Take 100 mg by mouth in the morning.    [provider]  alum & mag hydroxide-simeth (MAALOX/MYLANTA) 200-200-20 MG/5ML suspension Take 10 mLs by mouth 3 (three) times daily as needed for indigestion or heartburn.    [provider]  amiodarone (PACERONE) 200 MG tablet Take 0.5 tablets (100 mg total) by mouth daily. 07/11/23   Sherie Don, NP  apixaban (ELIQUIS) 2.5 MG  TABS tablet Take 1 tablet (2.5 mg total) by mouth 2 (two) times daily. 03/06/22   Sunnie Nielsen, DO  carboxymethylcellulose (REFRESH PLUS) 0.5 % SOLN 1 drop 3 (three) times daily as needed.    [provider]  Cholecalciferol (VITAMIN D) 50 MCG (2000 UT) CAPS Take 2,000 Units by mouth in the morning.    [provider]  docusate sodium (COLACE) 100 MG capsule Take 100 mg by mouth 2 (two) times daily.    [provider]  estradiol (ESTRACE) 0.1 MG/GM vaginal cream Place 1 Applicatorful vaginally every Tuesday, Thursday, and Saturday at 6 PM.    [provider]  famotidine (PEPCID) 10 MG tablet Take 10 mg by mouth in the morning.    [provider]  Fluticasone Furoate (FLONASE SENSIMIST NA) Place 1 spray into the nose in the morning.    [provider]  folic acid (FOLVITE) 1 MG tablet Take 1 tablet (1 mg total) by mouth daily. 02/02/23   Alford Highland, MD  furosemide (LASIX) 20 MG tablet Take 1 tablet (20 mg total) by mouth daily. 02/02/23   Alford Highland, MD  gabapentin (NEURONTIN) 300 MG capsule Take 600 mg by mouth 3 (three) times daily. (0800, 1400 & 2000)    [provider]  ibandronate (BONIVA) 150 MG tablet Take 150 mg by mouth every 30 (thirty) days. Take in the morning with a full glass of water, on an empty stomach, and do not take anything else by mouth or lie down for the next 30 min.    [provider]  Insulin Regular Human (NOVOLIN R FLEXPEN RELION) 100 UNIT/ML KwikPen Inject 0-10 Units into the skin as directed. Check blood sugar 3 times daily and inject insulin per sliding scale: Less than 70=0 units and eat a snack recheck in 15 minutes; 100-120=2 units 121-150=3 units 151-200=4 units 201-250=5 units 251-300=6 units 301-350=7 units 351-400=8 units 401-450=9 units 451-500=10 units; call MD for reading greater than 500.    [provider]  LANTUS 100 UNIT/ML injection Inject 0.09 mLs (9  Units total) into the skin at bedtime. Patient taking differently: Inject 18 Units into the skin at bedtime. 02/01/23   Alford Highland, MD  levothyroxine (SYNTHROID) 75 MCG tablet Take 37.5 mcg by mouth daily before breakfast. (0600)    [provider]  magnesium hydroxide (MILK OF MAGNESIA) 400 MG/5ML suspension Take 30 mLs by mouth daily as needed (constipation).    [provider]  midodrine (PROAMATINE) 5 MG tablet Take 5 mg by mouth 3 (three) times daily with meals. (0730, 1130 & 1600) DO NOT LIE DOWN AFTER TAKING, DO NOT TAKE AFTER EVENING MEAL. TAKE LAST DOSE OF THE DAY AT LEAST 4 HOURS BEFORE BEDTIME    [provider]  Multiple Vitamin (MULTIVITAMIN WITH MINERALS) TABS tablet Take 1 tablet by mouth daily. 02/02/23   Alford Highland, MD  nystatin cream (MYCOSTATIN) Apply 1 Application topically 2 (two) times daily as needed (rash).    [provider]  nystatin powder Apply 1 Application topically in the morning and at bedtime. (0800 & 2000) Apply topically to groin    [provider]  ondansetron (ZOFRAN-ODT) 4 MG disintegrating tablet Take 1 tablet (4 mg total) by mouth every 8 (eight) hours as needed for nausea or vomiting. 10/17/23   Signa Kell, MD  Polyethyl Glycol-Propyl Glycol 0.4-0.3 % SOLN Place 1 drop into both eyes See admin instructions. Instill 1 drop into both eye scheduled twice daily (0800 & 2000) & may instill 1 drop into both eyes twice daily if needed for itching eyes.    [provider]  polyethylene glycol (MIRALAX / GLYCOLAX) 17 g packet Take 17 g by mouth daily. 02/01/23   Alford Highland, MD  rOPINIRole (REQUIP) 0.5 MG tablet Take 0.5 mg by mouth at bedtime. (2000)    [provider]  senna (SENOKOT) 8.6 MG tablet Take 1 tablet by mouth at bedtime.    [provider]  sertraline (ZOLOFT) 100 MG tablet Take 100 mg by mouth in the morning. (0800) 100 mg + 25 mg=125 mg    [provider]   sertraline (ZOLOFT) 25 MG tablet Take 25 mg by mouth in the morning. (0800) 25 mg + 100 mg=125 mg  [provider]  solifenacin (VESICARE) 5 MG tablet Take 5 mg by mouth in the morning. (0800)    [provider]  thiamine (VITAMIN B-1) 100 MG tablet Take 1 tablet (100 mg total) by mouth daily. 02/02/23   Alford Highland, MD  traMADol (ULTRAM) 50 MG tablet Take 1 tablet (50 mg total) by mouth every 6 (six) hours as needed for moderate pain. Patient taking differently: Take 50 mg by mouth 2 (two) times daily as needed (pain.). 01/26/23   Dedra Skeens, PA-C  traMADol (ULTRAM) 50 MG tablet Take 1 tablet (50 mg total) by mouth every 4 (four) hours as needed for severe pain (pain score 7-10) or moderate pain (pain score 4-6). 10/17/23 10/16/24  Signa Kell, MD  vitamin B-12 (CYANOCOBALAMIN) 1000 MCG tablet Take 1,000 mcg by mouth in the morning.    [provider]    Physical Exam   Triage Vital Signs: ED Triage Vitals  Encounter Vitals Group     BP --      Systolic BP Percentile --      Diastolic BP Percentile --      Pulse Rate 10/27/23 2310 66     Resp 10/27/23 2310 20     Temp 10/27/23 2310 99 F (37.2 C)     Temp src --      SpO2 10/27/23 2309 100 %     Weight --      Height --      Head Circumference --      Peak Flow --      Pain Score 10/27/23 2313 10     Pain Loc --      Pain Education --      Exclude from Growth Chart --     Most recent vital signs: Vitals:   10/28/23 0714 10/28/23 0715  BP: (!) 100/40   Pulse: 66 64  Resp:  14  Temp:    SpO2: 100% 100%    CONSTITUTIONAL: Alert, responds appropriately to questions.  Elderly, tearful, appears extremely uncomfortable HEAD: Normocephalic, atraumatic EYES: Conjunctivae clear, pupils appear equal, sclera nonicteric ENT: normal nose; moist mucous membranes NECK: Supple, normal ROM CARD: RRR; S1 and S2 appreciated RESP: Normal chest excursion without splinting or tachypnea; breath sounds clear  and equal bilaterally; no wheezes, no rhonchi, no rales, no hypoxia or respiratory distress, speaking full sentences ABD/GI: Non-distended; soft, non-tender, no rebound, no guarding, no peritoneal signs BACK: The back appears normal EXT: Patient is extremely tender to palpation over the right anterior lateral hip with associated mild soft tissue swelling and bruising.  Incision sites are clean, dry and intact.  She has significant pain with any movement of the right hip.  No calf tenderness or calf swelling.  She has a 2+ palpable DP pulse and normal capillary refill.  2+ right femoral pulse. SKIN: Normal color for age and race; warm; no rash on exposed skin NEURO: Moves all extremities equally, normal speech, she feels like there is diminished sensation in the right leg compared to the left PSYCH: The patient's mood and manner are appropriate.   ED Results / Procedures / Treatments   LABS: (all labs ordered are listed, but only abnormal results are displayed) Labs Reviewed  CBC WITH DIFFERENTIAL/PLATELET - Abnormal; Notable for the following components:      Result Value   RBC 3.02 (*)    Hemoglobin 9.7 (*)    HCT 29.6 (*)    All other components within normal limits  URINALYSIS, W/ REFLEX TO CULTURE (INFECTION SUSPECTED) - Abnormal; Notable for the following components:   Color, Urine YELLOW (*)    APPearance CLEAR (*)    All other components within normal limits  COMPREHENSIVE METABOLIC PANEL - Abnormal; Notable for the following components:   BUN 30 (*)    Creatinine, Ser 1.35 (*)    Calcium 8.5 (*)    Total Protein 6.2 (*)    Albumin 3.3 (*)    AST 44 (*)    Alkaline Phosphatase 135 (*)    GFR, Estimated 39 (*)    All other components within normal limits  HEMOGLOBIN AND HEMATOCRIT, BLOOD - Abnormal; Notable for the following components:   Hemoglobin 8.9 (*)    HCT 27.5 (*)    All other components within normal limits  MAGNESIUM - Abnormal; Notable for the following  components:   Magnesium 1.6 (*)    All other components within normal limits  CBG MONITORING, ED - Abnormal; Notable for the following components:   Glucose-Capillary 58 (*)    All other components within normal limits  CBG MONITORING, ED - Abnormal; Notable for the following components:   Glucose-Capillary 110 (*)    All other components within normal limits  CULTURE, BLOOD (SINGLE)  LACTIC ACID, PLASMA  PHOSPHORUS  CBG MONITORING, ED     EKG:   RADIOLOGY: My personal review and interpretation of imaging: Doppler shows no DVT.  CT of the hip shows increasing hematoma.  I have personally reviewed all radiology reports.   CT Hip Right Wo Contrast Result Date: 10/28/2023 CLINICAL DATA:  Status post right hip ORIF with excessive postoperative pain. EXAM: CT OF THE RIGHT HIP WITHOUT CONTRAST TECHNIQUE: Multidetector CT imaging of the right hip was performed according to the standard protocol. Multiplanar CT image reconstructions were also generated. RADIATION DOSE REDUCTION: This exam was performed according to the departmental dose-optimization program which includes automated exposure control, adjustment of the mA and/or kV according to patient size and/or use of iterative reconstruction technique. COMPARISON:  None Available. FINDINGS: Bones/Joint/Cartilage Status post right hip ORIF with femoral fracture fragments in near anatomic alignment. No acute fracture or dislocation. Moderate right hip degenerative arthritis with joint space narrowing and osteophyte formation. Ligaments Suboptimally assessed by CT. Muscles and Tendons The previously identified intramuscular hematoma within the anterolateral right thigh encasing the proximal right femoral shaft and centered within the vastus intermedius demonstrates interval increase in size. While difficult to exactly delineate given noncontrast technique and incompletely included, this measures at least 7.5 x 6.7 x 12.9 cm in greatest dimension.  Degenerative enthesopathy is seen involving the origin of the instruments with cortical changes suggesting remote avulsion injury. Soft tissues Postsurgical changes are seen within the posterolateral right thigh with surgical skin staples partially visualized. Mild vascular calcification. IMPRESSION: 1. Status post right hip ORIF with femoral fracture fragments in near anatomic alignment. No acute fracture or dislocation. 2. Interval increase in size of the previously identified intramuscular hematoma within the anterolateral right thigh encasing the proximal right femoral shaft and centered within the vastus intermedius. While difficult to exactly delineate given noncontrast technique and incompletely included, this measures at least 7.5 x 6.7 x 12.9 cm in greatest dimension. 3. Moderate right hip degenerative arthritis.  Recur Electronically Signed   By: Helyn Numbers M.D.   On: 10/28/2023 02:28   US Venous Img Lower Unilateral Right Result Date: 10/28/2023 CLINICAL DATA:  Right hip pain following hardware removal EXAM: RIGHT LOWER EXTREMITY VENOUS DOPPLER  ULTRASOUND TECHNIQUE: Gray-scale sonography with graded compression, as well as color Doppler and duplex ultrasound were performed to evaluate the lower extremity deep venous systems from the level of the common femoral vein and including the common femoral, femoral, profunda femoral, popliteal and calf veins including the posterior tibial, peroneal and gastrocnemius veins when visible. The superficial great saphenous vein was also interrogated. Spectral Doppler was utilized to evaluate flow at rest and with distal augmentation maneuvers in the common femoral, femoral and popliteal veins. COMPARISON:  None Available. FINDINGS: Contralateral Common Femoral Vein: Respiratory phasicity is normal and symmetric with the symptomatic side. No evidence of thrombus. Normal compressibility. Common Femoral Vein: No evidence of thrombus. Normal compressibility,  respiratory phasicity and response to augmentation. Saphenofemoral Junction: No evidence of thrombus. Normal compressibility and flow on color Doppler imaging. Profunda Femoral Vein: No evidence of thrombus. Normal compressibility and flow on color Doppler imaging. Femoral Vein: No evidence of thrombus. Normal compressibility, respiratory phasicity and response to augmentation. Popliteal Vein: No evidence of thrombus. Normal compressibility, respiratory phasicity and response to augmentation. Calf Veins: No evidence of thrombus. Normal compressibility and flow on color Doppler imaging. Superficial Great Saphenous Vein: No evidence of thrombus. Normal compressibility. Venous Reflux:  None. Other Findings:  None. IMPRESSION: No evidence of deep venous thrombosis. Electronically Signed   By: Alcide Clever M.D.   On: 10/28/2023 00:31     PROCEDURES:  Critical Care performed: Yes, see critical care procedure note(s)   CRITICAL CARE Performed by: Baxter Hire Leshawn Straka   Total critical care time: 45 minutes  Critical care time was exclusive of separately billable procedures and treating other patients.  Critical care was necessary to treat or prevent imminent or life-threatening deterioration.  Critical care was time spent personally by me on the following activities: development of treatment plan with patient and/or surrogate as well as nursing, discussions with consultants, evaluation of patient's response to treatment, examination of patient, obtaining history from patient or surrogate, ordering and performing treatments and interventions, ordering and review of laboratory studies, ordering and review of radiographic studies, pulse oximetry and re-evaluation of patient's condition.   Marland Kitchen1-3 Lead EKG Interpretation  Performed by: Aaro Meyers, Layla Maw, DO Authorized by: Janvi Ammar, Layla Maw, DO     Interpretation: normal     ECG rate:  64   ECG rate assessment: normal     Rhythm: sinus rhythm     Ectopy: none      Conduction: normal       IMPRESSION / MDM / ASSESSMENT AND PLAN / ED COURSE  I reviewed the triage vital signs and the nursing notes.    Patient here with increasing hip pain.  Recent surgery and postoperative hematoma.  On Eliquis.  The patient is on the cardiac monitor to evaluate for evidence of arrhythmia and/or significant heart rate changes.   DIFFERENTIAL DIAGNOSIS (includes but not limited to):   Worsening hematoma, septic arthritis, fracture, hardware failure, cellulitis, DVT, no signs clinically of arterial obstruction or compartment syndrome   Patient's presentation is most consistent with acute presentation with potential threat to life or bodily function.   PLAN: Patient appears very uncomfortable and is tachycardic, hypertensive on arrival.  She feels quite warm to touch but rectal temperature is 99.7 and she denies any infectious symptoms.  Will obtain labs, CT of the hip, Doppler of the right lower extremity.  She has a strong palpable pulse and normal capillary refill.  Her compartments are soft.  Clinically no signs of arterial obstruction,  doubt dissection.  Doubt cauda equina, epidural abscess or hematoma, discitis or osteomyelitis, severe spinal stenosis.  Will give pain medication here.   MEDICATIONS GIVEN IN ED: Medications  fentaNYL (SUBLIMAZE) injection 50 mcg (50 mcg Intravenous Given 10/28/23 0504)  ondansetron (ZOFRAN) injection 4 mg (has no administration in time range)  midodrine (PROAMATINE) tablet 5 mg (5 mg Oral Given 10/28/23 0553)  norepinephrine (LEVOPHED) 4mg  in (0.016 mg/mL) premix infusion (3 mcg/min Intravenous Rate/Dose Change 10/28/23 0811)  docusate sodium (COLACE) capsule 100 mg (has no administration in time range)  polyethylene glycol (MIRALAX / GLYCOLAX) packet 17 g (has no administration in time range)  diphenhydrAMINE (BENADRYL) capsule 50 mg (has no administration in time range)    Or  diphenhydrAMINE (BENADRYL) injection 50 mg  (has no administration in time range)  magnesium sulfate IVPB 2 g 50 mL (has no administration in time range)  morphine (PF) 4 MG/ML injection 4 mg (4 mg Intravenous Given 10/28/23 0135)  ondansetron (ZOFRAN) injection 4 mg (4 mg Intravenous Given 10/28/23 0135)  morphine (PF) 4 MG/ML injection 4 mg (4 mg Intravenous Given 10/28/23 0348)  sodium chloride 0.9 % bolus 1,000 mL (0 mLs Intravenous Stopped 10/28/23 0500)  fentaNYL (SUBLIMAZE) injection 50 mcg (50 mcg Intravenous Given 10/28/23 0806)     ED COURSE: Patient's pain has been controlled after several rounds of narcotics.  Ultrasound reviewed and interpreted by myself and the radiologist and shows no DVT.  CT scan however shows an expanding hematoma measuring now 7.5 x 6.7 x 12.9 cm.  Will apply ice to this area and admit for further monitoring.  Her hemoglobin did drop from 11.0 on 10/18/2023 down to 9.7 on 10/28/2023.  Will discuss with the hospitalist.   Patient's blood pressure has dropped into the 80s.  I think this could be multifactorial.  She is on midodrine 3 times a day.  This could also be related to IV morphine.  There is also the possibility that this is related to acute blood loss from expanding hematoma although clinically there is not any increased swelling of the soft tissues of the hip.  Will repeat her H&H.  Will give her her home midodrine.  Will give IV fluids.   Blood pressure continues to be in the 80s despite a liter of fluids and now hemoglobin is 8.9.  Discussed with Dr. Para March with hospitalist service who asked that we consult critical care.  Doubt sepsis.  Patient afebrile.  No leukocytosis.  Normal lactic.  Urine shows no infection.   CONSULTS: Discussed with Webb Silversmith, NP with critical care who agrees to admit patient to the ICU.  Dr. Alvester Morin with hospitalist service updated.   OUTSIDE RECORDS REVIEWED: Reviewed recent surgical notes.       FINAL CLINICAL IMPRESSION(S) / ED DIAGNOSES   Final  diagnoses:  Hematoma  Hypotension, unspecified hypotension type  Anemia, unspecified type     Rx / DC Orders   ED Discharge Orders     None        Note:  This document was prepared using Dragon voice recognition software and may include unintentional dictation errors.   Zemirah Krasinski, Layla Maw, DO 10/28/23 [phone]

## 2023-10-28 ENCOUNTER — Inpatient Hospital Stay: Payer: 59

## 2023-10-28 ENCOUNTER — Other Ambulatory Visit: Payer: Self-pay

## 2023-10-28 ENCOUNTER — Emergency Department: Payer: 59

## 2023-10-28 DIAGNOSIS — J9601 Acute respiratory failure with hypoxia: Secondary | ICD-10-CM | POA: Diagnosis present

## 2023-10-28 DIAGNOSIS — F01B0 Vascular dementia, moderate, without behavioral disturbance, psychotic disturbance, mood disturbance, and anxiety: Secondary | ICD-10-CM | POA: Diagnosis present

## 2023-10-28 DIAGNOSIS — N179 Acute kidney failure, unspecified: Secondary | ICD-10-CM

## 2023-10-28 DIAGNOSIS — I480 Paroxysmal atrial fibrillation: Secondary | ICD-10-CM | POA: Diagnosis present

## 2023-10-28 DIAGNOSIS — J121 Respiratory syncytial virus pneumonia: Secondary | ICD-10-CM | POA: Diagnosis not present

## 2023-10-28 DIAGNOSIS — N1831 Chronic kidney disease, stage 3a: Secondary | ICD-10-CM | POA: Diagnosis not present

## 2023-10-28 DIAGNOSIS — E663 Overweight: Secondary | ICD-10-CM | POA: Diagnosis present

## 2023-10-28 DIAGNOSIS — R571 Hypovolemic shock: Secondary | ICD-10-CM | POA: Diagnosis not present

## 2023-10-28 DIAGNOSIS — Z794 Long term (current) use of insulin: Secondary | ICD-10-CM | POA: Diagnosis not present

## 2023-10-28 DIAGNOSIS — L7632 Postprocedural hematoma of skin and subcutaneous tissue following other procedure: Secondary | ICD-10-CM | POA: Diagnosis present

## 2023-10-28 DIAGNOSIS — E1151 Type 2 diabetes mellitus with diabetic peripheral angiopathy without gangrene: Secondary | ICD-10-CM | POA: Diagnosis present

## 2023-10-28 DIAGNOSIS — K219 Gastro-esophageal reflux disease without esophagitis: Secondary | ICD-10-CM

## 2023-10-28 DIAGNOSIS — E039 Hypothyroidism, unspecified: Secondary | ICD-10-CM | POA: Diagnosis present

## 2023-10-28 DIAGNOSIS — I5022 Chronic systolic (congestive) heart failure: Secondary | ICD-10-CM

## 2023-10-28 DIAGNOSIS — I5020 Unspecified systolic (congestive) heart failure: Secondary | ICD-10-CM | POA: Diagnosis not present

## 2023-10-28 DIAGNOSIS — E871 Hypo-osmolality and hyponatremia: Secondary | ICD-10-CM | POA: Diagnosis present

## 2023-10-28 DIAGNOSIS — Z66 Do not resuscitate: Secondary | ICD-10-CM | POA: Diagnosis present

## 2023-10-28 DIAGNOSIS — E119 Type 2 diabetes mellitus without complications: Secondary | ICD-10-CM

## 2023-10-28 DIAGNOSIS — S7001XA Contusion of right hip, initial encounter: Secondary | ICD-10-CM | POA: Diagnosis not present

## 2023-10-28 DIAGNOSIS — I130 Hypertensive heart and chronic kidney disease with heart failure and stage 1 through stage 4 chronic kidney disease, or unspecified chronic kidney disease: Secondary | ICD-10-CM | POA: Diagnosis present

## 2023-10-28 DIAGNOSIS — N184 Chronic kidney disease, stage 4 (severe): Secondary | ICD-10-CM | POA: Diagnosis present

## 2023-10-28 DIAGNOSIS — I272 Pulmonary hypertension, unspecified: Secondary | ICD-10-CM | POA: Diagnosis present

## 2023-10-28 DIAGNOSIS — Z1152 Encounter for screening for COVID-19: Secondary | ICD-10-CM | POA: Diagnosis not present

## 2023-10-28 DIAGNOSIS — Y838 Other surgical procedures as the cause of abnormal reaction of the patient, or of later complication, without mention of misadventure at the time of the procedure: Secondary | ICD-10-CM | POA: Diagnosis present

## 2023-10-28 DIAGNOSIS — T148XXA Other injury of unspecified body region, initial encounter: Secondary | ICD-10-CM | POA: Diagnosis present

## 2023-10-28 DIAGNOSIS — R578 Other shock: Secondary | ICD-10-CM | POA: Diagnosis present

## 2023-10-28 DIAGNOSIS — I959 Hypotension, unspecified: Secondary | ICD-10-CM | POA: Diagnosis not present

## 2023-10-28 DIAGNOSIS — E1165 Type 2 diabetes mellitus with hyperglycemia: Secondary | ICD-10-CM | POA: Diagnosis present

## 2023-10-28 DIAGNOSIS — E1149 Type 2 diabetes mellitus with other diabetic neurological complication: Secondary | ICD-10-CM | POA: Diagnosis present

## 2023-10-28 DIAGNOSIS — D509 Iron deficiency anemia, unspecified: Secondary | ICD-10-CM | POA: Diagnosis present

## 2023-10-28 DIAGNOSIS — D62 Acute posthemorrhagic anemia: Secondary | ICD-10-CM | POA: Diagnosis present

## 2023-10-28 DIAGNOSIS — K760 Fatty (change of) liver, not elsewhere classified: Secondary | ICD-10-CM | POA: Diagnosis present

## 2023-10-28 DIAGNOSIS — E1122 Type 2 diabetes mellitus with diabetic chronic kidney disease: Secondary | ICD-10-CM | POA: Diagnosis present

## 2023-10-28 LAB — PREPARE RBC (CROSSMATCH)

## 2023-10-28 LAB — RESPIRATORY PANEL BY PCR

## 2023-10-28 LAB — CBC WITH DIFFERENTIAL/PLATELET
Abs Immature Granulocytes: 0 10*3/uL (ref 0.00–0.07)
Basophils Absolute: 0 10*3/uL (ref 0.0–0.1)
Basophils Relative: 0 %
Eosinophils Absolute: 0.1 10*3/uL (ref 0.0–0.5)
Eosinophils Relative: 3 %
HCT: 29.6 % — ABNORMAL LOW (ref 36.0–46.0)
Hemoglobin: 9.7 g/dL — ABNORMAL LOW (ref 12.0–15.0)
Immature Granulocytes: 0 %
Lymphocytes Relative: 29 %
Lymphs Abs: 1.4 10*3/uL (ref 0.7–4.0)
MCH: 32.1 pg (ref 26.0–34.0)
MCHC: 32.8 g/dL (ref 30.0–36.0)
MCV: 98 fL (ref 80.0–100.0)
Monocytes Absolute: 0.4 10*3/uL (ref 0.1–1.0)
Monocytes Relative: 8 %
Neutro Abs: 3 10*3/uL (ref 1.7–7.7)
Neutrophils Relative %: 60 %
Platelets: 211 10*3/uL (ref 150–400)
RBC: 3.02 MIL/uL — ABNORMAL LOW (ref 3.87–5.11)
RDW: 14.5 % (ref 11.5–15.5)
WBC: 4.8 10*3/uL (ref 4.0–10.5)
nRBC: 0 % (ref 0.0–0.2)

## 2023-10-28 LAB — HEMOGLOBIN AND HEMATOCRIT, BLOOD
HCT: 27.5 % — ABNORMAL LOW (ref 36.0–46.0)
HCT: 30.9 % — ABNORMAL LOW (ref 36.0–46.0)
HCT: 34.6 % — ABNORMAL LOW (ref 36.0–46.0)
HCT: 28.5 % — ABNORMAL LOW (ref 36.0–46.0)
Hemoglobin: 8.9 g/dL — ABNORMAL LOW (ref 12.0–15.0)
Hemoglobin: 9.8 g/dL — ABNORMAL LOW (ref 12.0–15.0)
Hemoglobin: 11 g/dL — ABNORMAL LOW (ref 12.0–15.0)
Hemoglobin: 9.2 g/dL — ABNORMAL LOW (ref 12.0–15.0)

## 2023-10-28 LAB — URINALYSIS, W/ REFLEX TO CULTURE (INFECTION SUSPECTED)
Bacteria, UA: NONE SEEN
Bilirubin Urine: NEGATIVE
Glucose, UA: NEGATIVE mg/dL
Hgb urine dipstick: NEGATIVE
Ketones, ur: NEGATIVE mg/dL
Leukocytes,Ua: NEGATIVE
Nitrite: NEGATIVE
Protein, ur: NEGATIVE mg/dL
Specific Gravity, Urine: 1.018 (ref 1.005–1.030)
pH: 6 (ref 5.0–8.0)

## 2023-10-28 LAB — BPAM RBC
Blood Product Expiration Date: 202503162359
Unit Type and Rh: 6200

## 2023-10-28 LAB — COMPREHENSIVE METABOLIC PANEL
ALT: 23 U/L (ref 0–44)
AST: 44 U/L — ABNORMAL HIGH (ref 15–41)
Albumin: 3.3 g/dL — ABNORMAL LOW (ref 3.5–5.0)
Alkaline Phosphatase: 135 U/L — ABNORMAL HIGH (ref 38–126)
Anion gap: 10 (ref 5–15)
BUN: 30 mg/dL — ABNORMAL HIGH (ref 8–23)
CO2: 25 mmol/L (ref 22–32)
Calcium: 8.5 mg/dL — ABNORMAL LOW (ref 8.9–10.3)
Chloride: 106 mmol/L (ref 98–111)
Creatinine, Ser: 1.35 mg/dL — ABNORMAL HIGH (ref 0.44–1.00)
GFR, Estimated: 39 mL/min — ABNORMAL LOW (ref >60)
Glucose, Bld: 81 mg/dL (ref 70–99)
Potassium: 3.9 mmol/L (ref 3.5–5.1)
Sodium: 141 mmol/L (ref 135–145)
Total Bilirubin: 1.1 mg/dL (ref 0.0–1.2)
Total Protein: 6.2 g/dL — ABNORMAL LOW (ref 6.5–8.1)

## 2023-10-28 LAB — TYPE AND SCREEN
ABO/RH(D): A POS
Antibody Screen: NEGATIVE
Unit division: 0

## 2023-10-28 LAB — BRAIN NATRIURETIC PEPTIDE: B Natriuretic Peptide: 417.4 pg/mL — ABNORMAL HIGH (ref 0.0–100.0)

## 2023-10-28 LAB — SARS CORONAVIRUS 2 BY RT PCR: SARS Coronavirus 2 by RT PCR: NEGATIVE

## 2023-10-28 LAB — MAGNESIUM
Magnesium: 1.6 mg/dL — ABNORMAL LOW (ref 1.7–2.4)
Magnesium: 2.2 mg/dL (ref 1.7–2.4)

## 2023-10-28 LAB — APTT: aPTT: 31 s (ref 24–36)

## 2023-10-28 LAB — CBG MONITORING, ED
Glucose-Capillary: 58 mg/dL — ABNORMAL LOW (ref 70–99)
Glucose-Capillary: 79 mg/dL (ref 70–99)
Glucose-Capillary: 110 mg/dL — ABNORMAL HIGH (ref 70–99)

## 2023-10-28 LAB — CK: Total CK: 89 U/L (ref 38–234)

## 2023-10-28 LAB — PROTIME-INR
INR: 1.1 (ref 0.8–1.2)
Prothrombin Time: 14.3 s (ref 11.4–15.2)

## 2023-10-28 LAB — PHOSPHORUS: Phosphorus: 3.3 mg/dL (ref 2.5–4.6)

## 2023-10-28 LAB — LACTIC ACID, PLASMA: Lactic Acid, Venous: 1.2 mmol/L (ref 0.5–1.9)

## 2023-10-28 LAB — MRSA NEXT GEN BY PCR, NASAL: MRSA by PCR Next Gen: DETECTED — AB

## 2023-10-28 LAB — GLUCOSE, CAPILLARY
Glucose-Capillary: 259 mg/dL — ABNORMAL HIGH (ref 70–99)
Glucose-Capillary: 185 mg/dL — ABNORMAL HIGH (ref 70–99)

## 2023-10-28 MED ORDER — INSULIN ASPART 100 UNIT/ML IJ SOLN: 0.0000 [IU] | Freq: Every day | INTRAMUSCULAR | Status: DC

## 2023-10-28 MED ORDER — ADULT MULTIVITAMIN W/MINERALS CH
1.0000 | ORAL_TABLET | Freq: Every day | ORAL | Status: DC
  Administered 2023-10-29 – 2023-11-06 (×9): 1 via ORAL
  Filled 2023-10-28 (×9): qty 1

## 2023-10-28 MED ORDER — ACETAMINOPHEN 325 MG PO TABS: 650.0000 mg | ORAL_TABLET | Freq: Four times a day (QID) | ORAL | Status: DC | PRN

## 2023-10-28 MED ORDER — ROPINIROLE HCL 1 MG PO TABS
0.5000 mg | ORAL_TABLET | Freq: Every day | ORAL | Status: DC
  Administered 2023-10-29 – 2023-11-05 (×8): 0.5 mg via ORAL
  Filled 2023-10-28 (×3): qty 1
  Filled 2023-10-28: qty 2
  Filled 2023-10-28 (×2): qty 1
  Filled 2023-10-28 (×2): qty 2
  Filled 2023-10-28: qty 1

## 2023-10-28 MED ORDER — FENTANYL CITRATE PF 50 MCG/ML IJ SOSY: 25.0000 ug | PREFILLED_SYRINGE | INTRAMUSCULAR | Status: DC | PRN

## 2023-10-28 MED ORDER — NOREPINEPHRINE 4 MG/250ML-% IV SOLN
0.0000 ug/min | INTRAVENOUS | Status: DC
  Administered 2023-10-28 (×2): 2 ug/min via INTRAVENOUS
  Administered 2023-10-29 (×2): 6 ug/min via INTRAVENOUS
  Administered 2023-10-30: 7 ug/min via INTRAVENOUS
  Filled 2023-10-28 (×5): qty 250

## 2023-10-28 MED ORDER — SERTRALINE HCL 50 MG PO TABS: 25.0000 mg | ORAL_TABLET | Freq: Every morning | ORAL | Status: DC

## 2023-10-28 MED ORDER — ONDANSETRON HCL 4 MG/2ML IJ SOLN
4.0000 mg | Freq: Three times a day (TID) | INTRAMUSCULAR | Status: DC | PRN
  Administered 2023-10-28 – 2023-11-05 (×5): 4 mg via INTRAVENOUS
  Filled 2023-10-28 (×5): qty 2

## 2023-10-28 MED ORDER — ALBUTEROL SULFATE (2.5 MG/3ML) 0.083% IN NEBU
2.5000 mg | INHALATION_SOLUTION | Freq: Four times a day (QID) | RESPIRATORY_TRACT | Status: DC | PRN
  Administered 2023-11-01: 2.5 mg via RESPIRATORY_TRACT
  Filled 2023-10-28: qty 3

## 2023-10-28 MED ORDER — FENTANYL CITRATE PF 50 MCG/ML IJ SOSY
25.0000 ug | PREFILLED_SYRINGE | INTRAMUSCULAR | Status: DC | PRN
  Administered 2023-10-28 – 2023-10-29 (×2): 25 ug via INTRAVENOUS
  Filled 2023-10-28: qty 1

## 2023-10-28 MED ORDER — FENTANYL CITRATE PF 50 MCG/ML IJ SOSY
50.0000 ug | PREFILLED_SYRINGE | INTRAMUSCULAR | Status: DC | PRN
  Administered 2023-10-28: 50 ug via INTRAVENOUS
  Filled 2023-10-28 (×2): qty 1

## 2023-10-28 MED ORDER — DOCUSATE SODIUM 100 MG PO CAPS
100.0000 mg | ORAL_CAPSULE | Freq: Two times a day (BID) | ORAL | Status: DC
  Administered 2023-10-28 – 2023-11-06 (×14): 100 mg via ORAL
  Filled 2023-10-28 (×16): qty 1

## 2023-10-28 MED ORDER — VITAMIN D2 50 MCG (2000 UT) PO TABS: 1.0000 | ORAL_TABLET | Freq: Every day | ORAL | Status: DC

## 2023-10-28 MED ORDER — VITAMIN D 25 MCG (1000 UNIT) PO TABS
2000.0000 [IU] | ORAL_TABLET | Freq: Every morning | ORAL | Status: DC
  Administered 2023-10-29 – 2023-11-06 (×9): 2000 [IU] via ORAL
  Filled 2023-10-28 (×9): qty 2

## 2023-10-28 MED ORDER — SODIUM CHLORIDE 0.9% FLUSH: 10.0000 mL | INTRAVENOUS | Status: DC | PRN

## 2023-10-28 MED ORDER — FAMOTIDINE 20 MG PO TABS
10.0000 mg | ORAL_TABLET | Freq: Every morning | ORAL | Status: DC
  Administered 2023-10-29 – 2023-11-06 (×9): 10 mg via ORAL
  Filled 2023-10-28 (×9): qty 1

## 2023-10-28 MED ORDER — SODIUM CHLORIDE 0.9% FLUSH
10.0000 mL | Freq: Two times a day (BID) | INTRAVENOUS | Status: DC
  Administered 2023-10-28 – 2023-10-29 (×3): 10 mL
  Administered 2023-10-30: 30 mL
  Administered 2023-10-31: 10 mL
  Administered 2023-11-01: 20 mL
  Administered 2023-11-01 – 2023-11-06 (×10): 10 mL

## 2023-10-28 MED ORDER — DOCUSATE SODIUM 100 MG PO CAPS
100.0000 mg | ORAL_CAPSULE | Freq: Two times a day (BID) | ORAL | Status: DC | PRN
  Administered 2023-10-30: 100 mg via ORAL

## 2023-10-28 MED ORDER — MAGNESIUM SULFATE 2 GM/50ML IV SOLN
2.0000 g | Freq: Once | INTRAVENOUS | Status: AC
  Administered 2023-10-28: 2 g via INTRAVENOUS
  Filled 2023-10-28: qty 50

## 2023-10-28 MED ORDER — POLYETHYLENE GLYCOL 3350 17 G PO PACK
17.0000 g | PACK | Freq: Every day | ORAL | Status: DC | PRN
  Administered 2023-10-30: 17 g via ORAL
  Filled 2023-10-28: qty 1

## 2023-10-28 MED ORDER — GABAPENTIN 300 MG PO CAPS
600.0000 mg | ORAL_CAPSULE | Freq: Three times a day (TID) | ORAL | Status: DC
  Administered 2023-10-28 – 2023-11-06 (×27): 600 mg via ORAL
  Filled 2023-10-28 (×18): qty 2
  Filled 2023-10-28: qty 6
  Filled 2023-10-28 (×8): qty 2

## 2023-10-28 MED ORDER — DIPHENHYDRAMINE HCL 25 MG PO CAPS: 50.0000 mg | ORAL_CAPSULE | Freq: Once | ORAL | Status: DC

## 2023-10-28 MED ORDER — OXYCODONE-ACETAMINOPHEN 5-325 MG PO TABS
1.0000 | ORAL_TABLET | Freq: Four times a day (QID) | ORAL | Status: DC | PRN
  Administered 2023-10-28: 2 via ORAL
  Administered 2023-10-28: 1 via ORAL
  Administered 2023-10-29: 2 via ORAL
  Administered 2023-10-29: 1 via ORAL
  Filled 2023-10-28 (×2): qty 2
  Filled 2023-10-28: qty 1
  Filled 2023-10-28: qty 2

## 2023-10-28 MED ORDER — SERTRALINE HCL 50 MG PO TABS
125.0000 mg | ORAL_TABLET | Freq: Every morning | ORAL | Status: DC
  Administered 2023-10-29 – 2023-11-06 (×9): 125 mg via ORAL
  Filled 2023-10-28 (×9): qty 3

## 2023-10-28 MED ORDER — INSULIN ASPART 100 UNIT/ML IJ SOLN
0.0000 [IU] | Freq: Three times a day (TID) | INTRAMUSCULAR | Status: DC
  Administered 2023-10-28: 5 [IU] via SUBCUTANEOUS
  Administered 2023-10-29: 3 [IU] via SUBCUTANEOUS
  Filled 2023-10-28 (×2): qty 1

## 2023-10-28 MED ORDER — SODIUM CHLORIDE 0.9% IV SOLUTION
Freq: Once | INTRAVENOUS | Status: DC
  Filled 2023-10-28: qty 250

## 2023-10-28 MED ORDER — ORAL CARE MOUTH RINSE: 15.0000 mL | OROMUCOSAL | Status: DC | PRN

## 2023-10-28 MED ORDER — THIAMINE MONONITRATE 100 MG PO TABS
100.0000 mg | ORAL_TABLET | Freq: Every day | ORAL | Status: DC
  Administered 2023-10-28 – 2023-11-06 (×10): 100 mg via ORAL
  Filled 2023-10-28 (×10): qty 1

## 2023-10-28 MED ORDER — POLYETHYLENE GLYCOL 3350 17 G PO PACK
17.0000 g | PACK | Freq: Every day | ORAL | Status: DC
  Administered 2023-10-30 – 2023-11-06 (×3): 17 g via ORAL
  Filled 2023-10-28 (×7): qty 1

## 2023-10-28 MED ORDER — FENTANYL CITRATE PF 50 MCG/ML IJ SOSY
50.0000 ug | PREFILLED_SYRINGE | Freq: Once | INTRAMUSCULAR | Status: AC
  Administered 2023-10-28: 50 ug via INTRAVENOUS
  Filled 2023-10-28 (×2): qty 1

## 2023-10-28 MED ORDER — MORPHINE SULFATE (PF) 4 MG/ML IV SOLN
4.0000 mg | Freq: Once | INTRAVENOUS | Status: AC
  Administered 2023-10-28: 4 mg via INTRAVENOUS
  Filled 2023-10-28: qty 1

## 2023-10-28 MED ORDER — MIDODRINE HCL 5 MG PO TABS
5.0000 mg | ORAL_TABLET | Freq: Three times a day (TID) | ORAL | Status: DC
  Administered 2023-10-28 – 2023-10-30 (×7): 5 mg via ORAL
  Filled 2023-10-28 (×7): qty 1

## 2023-10-28 MED ORDER — LEVOTHYROXINE SODIUM 25 MCG PO TABS
37.5000 ug | ORAL_TABLET | Freq: Every day | ORAL | Status: DC
  Administered 2023-10-29 – 2023-11-06 (×9): 37.5 ug via ORAL
  Filled 2023-10-28 (×2): qty 2
  Filled 2023-10-28: qty 0.5
  Filled 2023-10-28: qty 2
  Filled 2023-10-28: qty 0.5
  Filled 2023-10-28 (×2): qty 2
  Filled 2023-10-28: qty 0.5
  Filled 2023-10-28: qty 2

## 2023-10-28 MED ORDER — SODIUM CHLORIDE 0.9 % IV BOLUS (SEPSIS)
1000.0000 mL | Freq: Once | INTRAVENOUS | Status: AC
Start: 2023-10-28 — End: 2023-10-28
  Administered 2023-10-28: 1000 mL via INTRAVENOUS

## 2023-10-28 MED ORDER — ACETAMINOPHEN 325 MG PO TABS
650.0000 mg | ORAL_TABLET | Freq: Four times a day (QID) | ORAL | Status: DC
  Administered 2023-10-28 – 2023-11-03 (×22): 650 mg via ORAL
  Filled 2023-10-28 (×22): qty 2

## 2023-10-28 MED ORDER — EMPTY CONTAINERS FLEXIBLE MISC
1597.0000 [IU] | Status: AC
  Administered 2023-10-28: 1597 [IU] via INTRAVENOUS
  Filled 2023-10-28: qty 597

## 2023-10-28 MED ORDER — DIPHENHYDRAMINE HCL 50 MG/ML IJ SOLN: 50.0000 mg | Freq: Once | INTRAMUSCULAR | Status: DC

## 2023-10-28 MED ORDER — CHLORHEXIDINE GLUCONATE CLOTH 2 % EX PADS
6.0000 | MEDICATED_PAD | Freq: Every day | CUTANEOUS | Status: DC
  Administered 2023-10-28 – 2023-11-06 (×10): 6 via TOPICAL

## 2023-10-28 NOTE — ED Notes (Signed)
This RN re-checked the pt's blood sugar. Blood sugar was 79.

## 2023-10-28 NOTE — Consult Note (Signed)
PHARMACY CONSULT NOTE - ELECTROLYTES  Pharmacy Consult for Electrolyte Monitoring and Replacement   Recent Labs:  Estimated Creatinine Clearance: 29.7 mL/min (A) (by C-G formula based on SCr of 1.35 mg/dL (H)).  Potassium (mmol/L)  Date Value  10/28/2023 3.9  10/16/2014 4.6   Magnesium (mg/dL)  Date Value  13/04/6577 1.6 (L)   Calcium (mg/dL)  Date Value  46/96/2952 8.5 (L)   Calcium, Total (mg/dL)  Date Value  84/13/2440 8.5   Albumin (g/dL)  Date Value  07/09/2535 3.3 (L)  07/03/2023 4.4  10/29/2012 3.9   Phosphorus (mg/dL)  Date Value  64/40/3474 3.3   Sodium (mmol/L)  Date Value  10/28/2023 141  07/03/2023 137  10/16/2014 141   Assessment  Deborah Obrien is a 83 y.o. female presenting with hypoglycemia. PMH significant for CAD, HFpEF, atrial fibrillation, SA node dysfunction/SSS (s/p PPM placement). BILATERAL carotid artery disease, TIA, chronic cerebral microvascular disease, aortic atherosclerosis, HTN, HLD, T2DM, hypothyroidism, CKD-IV, pulmonary hypertension, GERD (on daily H2 blocker), gastritis, GI bleeding,  anemia, OA, cervical DDD (s/p ACDF C6-C7), chronic back pain, peripheral neuropathy, anxiety, depression. Pharmacy has been consulted to monitor and replace electrolytes.  Diet: NPO MIVF: None Pertinent medications: None  Goal of Therapy: Electrolytes WNL  Plan:  Mag 1.6; will order magnesium 2 gm IV x 1  No other replacement indicated at this time Check BMP, Mg, Phos with AM labs  Thank you for allowing pharmacy to be a part of this patient's care.  Littie Deeds, PharmD Pharmacy Resident  10/28/2023 7:14 AM

## 2023-10-28 NOTE — ED Notes (Signed)
Pt blood sugar was low at 58. This RN gave the pt two containers of cranberry juice and 2 packs of graham crackers. Will re-check in 15 minutes

## 2023-10-28 NOTE — H&P (Signed)
NAME:  Deborah Obrien, MRN:  [id number], DOB:  [birth year], LOS: 0 ADMISSION DATE:  10/27/2023, CONSULTATION DATE: 10/28/2023 REFERRING MD: Dr. Elesa Massed, CHIEF COMPLAINT: Right hip pain    History of Present Illness:  This is an 83 yo female who presented to [facility] ER on 02/14 via EMS from [facility] with c/o right hip and right sided abdominal pain.  She recently presented to [facility] ER on 02/5 with postoperative bleeding following an outpatient right hip removal of nail hardware with screw exchange on 02/4.  She resumed her outpatient apixaban post procedure.  During that ER presentation CT Right Hip revealed intramuscular hematoma.  EDP contacted on call orthopedic surgeon Dr. Audelia Acton and he recommended hip spica Ace wrap pressure dressing, continuing apixaban to prevent blood clots.  Pt discharged back to [facility].  However, she later developed severe right hip pain prompting return to the ER.    ED Course  Upon arrival to the ER pt hypotensive map's 54 to 60's.  She received 1L NS bolus, however she remained hypotensive requiring peripheral levophed gtt.  Significant lab results were: BUN 30/creatinine 1.35/calcium 8.5/magnesium 1.6/alk phos 135/albumin 3.3/AST 44/hgb 9.7.  CT Right Hip revealed interval increase in size of the previously identified intramuscular hematoma within the anterolateral right thigh encasing the proximal right femoral shaft and centered within the vastus intermedius.  Her last dose of apixaban was the evening of 02/14.  Pt also noted to have severe right abdominal pain.  CT Abd Pelvis negative for acute inflammatory process within the abdomen or pelvis.  PCCM contacted for ICU admission.  Orthopedic surgeon Dr. Joice Lofts consulted to assist with management.    Pertinent  Medical History  Anemia  Anxiety  Aortic Atherosclerosis  Bowel Obstruction  CAD Cerebral Microvascular Disease HFrEF  CKD Stage IV  Colon Polyp  Degenerative Disc Disease  Depression  Diabetic  Neuropathy  Fatty Liver  GERD  GI Bleed  Gout  HTN  Hypothyroidism  Paroxysmal Atrial Fibrillation (pt on apixaban) Permanent Cardiac Pacemaker  Pulmonary Hypertension  Type II Diabetes Mellitus  TIA  Vascular Dementia   Significant Hospital Events: Including procedures, antibiotic start and stop dates in addition to other pertinent events   02/15: Pt admitted to ICU with hemorrhagic shock secondary to right hip intramuscular hematoma after resuming apixaban following right hip removal of nail hardware with screw exchange on 02/4  Interim History / Subjective:  Pt c/o 9/10 right hip pain she also complains of right abdominal pain with palpation.  She is currently requiring levophed gtt at 3 mcg/min to maintain map 65 or higher.   Objective   Blood pressure (!) 88/38, pulse 65, temperature 97.7 F (36.5 C), temperature source Axillary, resp. rate 20, SpO2 100%.        Intake/Output Summary (Last 24 hours) at 10/28/2023 0708 Last data filed at 10/28/2023 0500 Gross per 24 hour  Intake 1000 ml  Output --  Net 1000 ml   There were no vitals filed for this visit.  Examination: General: Acutely-ill appearing female, with severe right hip pain on RA  HENT: Supple, no JVD  Lungs: Clear throughout, even, non labored  Cardiovascular: Sinus rhythm, s1s2, no m/r/g.  2+ radial/1+ distal pulses, 1+ right hip edema  Abdomen: +BS x4, obese, right quadrant tenderness with palpation  Extremities: Moves all extremities, limited right hip ROM due to severe pain  Skin: Right surgical incision site with ecchymosis, RLQ ecchymosis  Neuro: Alert and oriented, following commands, PERRLA GU:  Purewick in place draining dark yellow urine   Resolved Hospital Problem list     Assessment & Plan:   #Acute pain  - Prn tylenol, percocet and fentanyl for pain management   #Acute kidney injury secondary to ATN  #Hypomagnesia  - Trend BMP  - Replace electrolytes as indicated  - Strict I&O's  -  Avoid nephrotoxic agents as able   #HFrEF Hx: CAD, permanent pacemaker, HTN, paroxysmal atrial fibrillation, and pulmonary hypertension  - Continuous telemetry monitoring  - Hold outpatient lasix  - Continue outpatient midodrine   #Right hip intramuscular hematoma secondary to anticoagulation post right hip removal of nail hardware with screw exchange on 02/4 #Hemorrhagic shock  - Prn pRBC's and/or levophed gtt to maintain map 65 or higher  - Will reverse apixaban with Theodoro Parma  - Avoid chemical VTE px for now - Trend H&H  - PT/INR/PTT/CK results pending  - Orthopedic surgery consulted appreciate input   GERD - Continue outpatient pepcid  Hypothyroidism - Resume outpatient synthroid  Type II diabetes mellitus  - CBG's ac/hs - SSI  - Target CBG 140 to 180 - Follow hyper/hypoglycemic protocol   Best Practice (right click and "Reselect all SmartList Selections" daily)   Diet/type: Regular consistency (see orders) DVT prophylaxis SCD Pressure ulcer(s): N/A GI prophylaxis: H2B Lines: N/A Foley:  N/A Code Status:  full code Last date of multidisciplinary goals of care discussion [N/A]  02/15: Updated pt and pts son Hatley Henegar at bedside regarding pts condition and current plan of care.  All questions were answered  Labs   CBC: Recent Labs  Lab 10/28/23 0033 10/28/23 0452  WBC 4.8  --   NEUTROABS 3.0  --   HGB 9.7* 8.9*  HCT 29.6* 27.5*  MCV 98.0  --   PLT 211  --     Basic Metabolic Panel: Recent Labs  Lab 10/28/23 0307  NA 141  K 3.9  CL 106  CO2 25  GLUCOSE 81  BUN 30*  CREATININE 1.35*  CALCIUM 8.5*   GFR: Estimated Creatinine Clearance: 29.7 mL/min (A) (by C-G formula based on SCr of 1.35 mg/dL (H)). Recent Labs  Lab 10/28/23 0033  WBC 4.8  LATICACIDVEN 1.2    Liver Function Tests: Recent Labs  Lab 10/28/23 0307  AST 44*  ALT 23  ALKPHOS 135*  BILITOT 1.1  PROT 6.2*  ALBUMIN 3.3*   No results for input(s): "LIPASE", "AMYLASE" in  the last 168 hours. No results for input(s): "AMMONIA" in the last 168 hours.  ABG    Component Value Date/Time   PHART 7.34 (L) 01/22/2023 1400   PCO2ART 36 01/22/2023 1400   PO2ART 102 01/22/2023 1400   HCO3 19.4 (L) 01/22/2023 1400   TCO2 24 07/15/2020 0921   ACIDBASEDEF 5.7 (H) 01/22/2023 1400   O2SAT 98.8 01/22/2023 1400     Coagulation Profile: No results for input(s): "INR", "PROTIME" in the last 168 hours.  Cardiac Enzymes: No results for input(s): "CKTOTAL", "CKMB", "CKMBINDEX", "TROPONINI" in the last 168 hours.  HbA1C: Hemoglobin A1C  Date/Time Value Ref Range Status  09/26/2012 12:00 PM 7.5 (H) 4.2 - 6.3 % Final    Comment:    The American Diabetes Association recommends that a primary goal of therapy should be <7% and that physicians should reevaluate the treatment regimen in patients with HbA1c values consistently >8%.    Hgb A1c MFr Bld  Date/Time Value Ref Range Status  10/10/2023 12:39 PM 9.2 (H) 4.8 - 5.6 % Final  Comment:    (NOTE) Pre diabetes:          5.7%-6.4%  Diabetes:              >6.4%  Glycemic control for   <7.0% adults with diabetes   01/21/2023 04:20 AM 6.8 (H) 4.8 - 5.6 % Final    Comment:    (NOTE) Pre diabetes:          5.7%-6.4%  Diabetes:              >6.4%  Glycemic control for   <7.0% adults with diabetes     CBG: Recent Labs  Lab 10/28/23 0443 10/28/23 0520 10/28/23 0618  GLUCAP 58* 79 110*    Review of Systems: Positives in BOLD   Gen: Denies fever, chills, weight change, fatigue, night sweats HEENT: Denies blurred vision, double vision, hearing loss, tinnitus, sinus congestion, rhinorrhea, sore throat, neck stiffness, dysphagia PULM: Denies shortness of breath, cough, sputum production, hemoptysis, wheezing CV: Denies chest pain, edema, orthopnea, paroxysmal nocturnal dyspnea, palpitations GI: abdominal pain, nausea, vomiting, diarrhea, hematochezia, melena, constipation, change in bowel habits GU: Denies  dysuria, hematuria, polyuria, oliguria, urethral discharge Endocrine: Denies hot or cold intolerance, polyuria, polyphagia or appetite change Musculo: Right hip pain Derm: Denies rash, dry skin, scaling or peeling skin change Heme: Denies easy bruising, bleeding, bleeding gums Neuro: Denies headache, numbness, weakness, slurred speech, loss of memory or consciousness  Past Medical History:  She,  has a past medical history of Acute respiratory failure (HCC) (04/09/2018), Anemia, Anxiety, Aortic atherosclerosis (HCC), Bowel obstruction (HCC), Carotid artery disease (HCC), Cerebral microvascular disease, Chronic heart failure with preserved ejection fraction (HFpEF) (HCC), CKD (chronic kidney disease), stage IV (HCC), Closed fracture of one rib of right side (05/29/2020), Colon polyp, Complete tear of left rotator cuff (10/16/2014), Coronary artery disease (non-obstructive) (08/25/2016), DDD (degenerative disc disease), cervical, Depression, Diabetic neuropathy (HCC), Diarrhea (03/04/2022), Displaced intertrochanteric fracture of right femur (HCC), Fatty liver, Gastritis without bleeding, GERD (gastroesophageal reflux disease), GI bleed, Gout, Hypertension, Hypothyroidism, Long term current use of amiodarone, Low back pain, Morbid obesity (HCC), Multiple gastric polyps, 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), On apixaban therapy, Osteoarthritis, Osteoporosis, PAF (paroxysmal atrial fibrillation) (HCC) (2016), Presence of permanent cardiac pacemaker (07/01/2015), Pulmonary hypertension (HCC), Sinus node dysfunction (HCC), T2DM (type 2 diabetes mellitus) (HCC), Tendinitis of upper biceps tendon of left shoulder (11/10/2019), TIA (transient ischemic attack), Type 2 diabetes mellitus with neurological complications (HCC) (05/02/2018), Uncontrolled type 2 diabetes mellitus with hypoglycemia, with long-term current use of insulin (HCC) (03/04/2022), Urinary incontinence (10/17/2022), and Vascular dementia, moderate,  with anxiety (HCC).   Surgical History:   Past Surgical History:  Procedure Laterality Date   ABDOMINAL HYSTERECTOMY  1980   APPENDECTOMY     BACK SURGERY     2008. plate & screws    BACK SURGERY  2019   patient describes kyphoplasty for compression fractures, MD referral said fusion   BREAST BIOPSY Right 04/2015   stereo fibroadenomatous change, neg for atypia   BREAST EXCISIONAL BIOPSY Left    neg   CARDIAC CATHETERIZATION Left 08/25/2016   Procedure: Left Heart Cath and Coronary Angiography;  Surgeon: Lamar Blinks, MD;  Location: [facility] INVASIVE CV LAB;  Service: Cardiovascular;  Laterality: Left;   CARDIOVERSION N/A 06/28/2017   Procedure: CARDIOVERSION;  Surgeon: Lamar Blinks, MD;  Location: [facility] ORS;  Service: Cardiovascular;  Laterality: N/A;   CARDIOVERSION N/A 02/06/2018   Procedure: CARDIOVERSION;  Surgeon: Wendall Stade, MD;  Location:  [facility] ENDOS[facility];  Service: Cardiovascular;  Laterality: N/A;   CARDIOVERSION N/A 07/15/2020   Procedure: CARDIOVERSION;  Surgeon: Jake Bathe, MD;  Location: [facility] ENDOSCOPY;  Service: Cardiovascular;  Laterality: N/A;   CATARACT EXTRACTION, BILATERAL  2012   CHOLECYSTECTOMY     colon blockage  1999   COLON SURGERY  2000   removed 20% of colon. colon had collapsed   COLONOSCOPY  2016   polyps removed 2016   DORSAL COMPARTMENT RELEASE Left 09/14/2016   Procedure: RELEASE DORSAL COMPARTMENT (DEQUERVAIN);  Surgeon: Donato Heinz, MD;  Location: [facility] ORS;  Service: Orthopedics;  Laterality: Left;   ESOPHAGOGASTRODUODENOSCOPY N/A 01/27/2015   Procedure: ESOPHAGOGASTRODUODENOSCOPY (EGD);  Surgeon: Christena Deem, MD;  Location: [facility] ENDOSCOPY;  Service: Endoscopy;  Laterality: N/A;   ESOPHAGOGASTRODUODENOSCOPY (EGD) WITH PROPOFOL N/A 07/24/2015   Procedure: ESOPHAGOGASTRODUODENOSCOPY (EGD) WITH PROPOFOL;  Surgeon: Midge Minium, MD;  Location: [facility] ENDOSCOPY;  Service: Endoscopy;  Laterality: N/A;   ESOPHAGOGASTRODUODENOSCOPY (EGD)  WITH PROPOFOL N/A 04/12/2018   Procedure: ESOPHAGOGASTRODUODENOSCOPY (EGD) WITH PROPOFOL;  Surgeon: Wyline Mood, MD;  Location: [facility] ENDOSCOPY;  Service: Gastroenterology;  Laterality: N/A;   ESOPHAGOGASTRODUODENOSCOPY (EGD) WITH PROPOFOL N/A 06/22/2018   Procedure: ESOPHAGOGASTRODUODENOSCOPY (EGD) WITH PROPOFOL;  Surgeon: Rachael Fee, MD;  Location: [facility] ENDOSCOPY;  Service: Endoscopy;  Laterality: N/A;   ESOPHAGOGASTRODUODENOSCOPY (EGD) WITH PROPOFOL N/A 07/09/2018   Procedure: ESOPHAGOGASTRODUODENOSCOPY (EGD) WITH PROPOFOL with resection of gastric polyps;  Surgeon: Wyline Mood, MD;  Location: [facility] ENDOSCOPY;  Service: Gastroenterology;  Laterality: N/A;   ESOPHAGOGASTRODUODENOSCOPY (EGD) WITH PROPOFOL N/A 05/17/2019   Procedure: ESOPHAGOGASTRODUODENOSCOPY (EGD) WITH PROPOFOL;  Surgeon: Wyline Mood, MD;  Location: [facility] ENDOSCOPY;  Service: Gastroenterology;  Laterality: N/A;   EUS N/A 06/22/2018   Procedure: UPPER ENDOSCOPIC ULTRASOUND (EUS) RADIAL;  Surgeon: Rachael Fee, MD;  Location: [facility] ENDOSCOPY;  Service: Endoscopy;  Laterality: N/A;   EYE SURGERY Bilateral    cataract extractions   HARDWARE REMOVAL Right 10/17/2023   Procedure: Right hip removal of nail hardware with screw exchange;  Surgeon: Signa Kell, MD;  Location: [facility] ORS;  Service: Orthopedics;  Laterality: Right;   HUMERUS IM NAIL Left 06/02/2022   Procedure: INTRAMEDULLARY (IM) NAIL HUMERAL;  Surgeon: Christena Flake, MD;  Location: [facility] ORS;  Service: Orthopedics;  Laterality: Left;   INTRAMEDULLARY (IM) NAIL INTERTROCHANTERIC Right 01/21/2023   Procedure: INTRAMEDULLARY (IM) NAIL INTERTROCHANTERIC;  Surgeon: Signa Kell, MD;  Location: [facility] ORS;  Service: Orthopedics;  Laterality: Right;   JOINT REPLACEMENT Bilateral 2014   Bilateral Knee replacement   LEAD REVISION/REPAIR N/A 08/29/2017   Procedure: LEAD REVISION/REPAIR;  Surgeon: Regan Lemming, MD;  [facility]: [facility] CV LAB;  Service: Cardiovascular;   Laterality: N/A;   OOPHORECTOMY     PACEMAKER INSERTION Left 07/01/2015   Procedure: INSERTION PACEMAKER;  Surgeon: Marcina Millard, MD;  Location: [facility] ORS;  Service: Cardiovascular;  Laterality: Left;   PACEMAKER REVISION N/A 08/28/2017   Procedure: PACEMAKER REVISION;  Surgeon: Duke Salvia, MD;  Location: [facility] INVASIVE CV LAB;  Service: Cardiovascular;  Laterality: N/A;   REPLACEMENT TOTAL KNEE BILATERAL     SHOULDER ARTHROSCOPY WITH ROTATOR CUFF REPAIR AND OPEN BICEPS TENODESIS Left 11/21/2019   Procedure: LEFT SHOULDER ARTHROSCOPY WITH DEBRIDEMENT, DECOMPRESSION, ROTATOR CUFF REPAIR AND BICEPS TENOLYSIS;  Surgeon: Christena Flake, MD;  Location: [facility] ORS;  Service: Orthopedics;  Laterality: Left;   SHOULDER ARTHROSCOPY WITH SUBACROMIAL DECOMPRESSION Left 2013   TEE WITHOUT CARDIOVERSION N/A 06/28/2017   Procedure: TRANSESOPHAGEAL ECHOCARDIOGRAM (TEE);  Surgeon: Gwen Pounds,  Quentin Cornwall, MD;  Location: [facility] ORS;  Service: Cardiovascular;  Laterality: N/A;   TUBAL LIGATION       Social History:   reports that she has never smoked. She has never used smokeless tobacco. She reports that she does not drink alcohol and does not use drugs.   Family History:  Her family history includes CAD in her mother; Cancer in her father; Diabetes Mellitus II in her brother and mother; Heart attack in her mother; Stroke in her brother. There is no history of Breast cancer.   Allergies Allergies  Allergen Reactions   Fish Allergy Shortness Of Breath    All kinds of fish. Severe vomitting even with smell of fish.    Macrolides And Ketolides Other (See Comments)    Severe stomach pain (mycins)   Meperidine Shortness Of Breath, Nausea Only and Other (See Comments)    Stomach pain    Other Nausea Only, Swelling, Rash, Anaphylaxis, Diarrhea, Shortness Of Breath and Other (See Comments)    Allergen: NON-STEROIDS, bextra - hands and feet swell  Reaction:  Stomach pain   Other Reaction: Intolerance    Prednisone Anaphylaxis and Other (See Comments)    (facial swelling/redness/burning)Increases pts blood sugar  Pt states that she is allergic to all steroids.     Shellfish Allergy Anaphylaxis, Shortness Of Breath, Diarrhea, Nausea Only and Rash    Stomach pain    Sulfa Antibiotics Shortness Of Breath, Diarrhea, Nausea Only and Other (See Comments)    Reaction:  Stomach pain     Sulfacetamide Sodium Diarrhea, Nausea Only, Other (See Comments) and Shortness Of Breath    Reaction:  Stomach pain    Telbivudine Other (See Comments)    Unknown reaction   Uloric [Febuxostat] Anaphylaxis    Locks pt's body up    Aspirin Rash and Other (See Comments)    Stomach pain (tolerates lower doses)   Celecoxib Other (See Comments)    GI bleed, weakness, and stomach pain.     Cephalexin Diarrhea, Nausea Only and Other (See Comments)    stomach pain    Codeine Diarrhea, Nausea Only and Other (See Comments)    Reaction:  Stomach pain Pt tolerates morphine    Dilaudid [Hydromorphone Hcl] Other (See Comments)    Reactions: easy to overdose - blood pressure drops really low   Erythromycin Diarrhea, Nausea Only and Other (See Comments)    Reaction:  Stomach pain    Iodinated Contrast Media Rash and Other (See Comments)    Pt states that she is unable to have because she has chronic kidney disease.     Lipitor [Atorvastatin Calcium] Nausea Only    Reaction: nausea, weakness, pass blood   Oxycodone Other (See Comments)    Reaction:  Stomach pain    Aleve [Naproxen]     Reaction: severe stomach pain   Atorvastatin Nausea Only and Other (See Comments)    Weakness    Cephalosporins Diarrhea, Nausea Only and Other (See Comments)    Other: abdominal pain  Tolerated 1st generation cephalosporin (CEFAZOLIN) on 06/02/2022 and 01/21/2023 without documented ADRs.    Doxycycline     Stomach pain    Iodine Rash   Motrin [Ibuprofen]     Reaction: severe stomach pain   Tape Other (See Comments)    Causes pts  skin to tear  Pt states that she is able to use paper tape.       Valdecoxib Swelling and Other (See Comments)    Pt  states that her hands and feet swell.       Home Medications  Prior to Admission medications   Medication Sig Start Date End Date Taking? Authorizing Provider  acetaminophen (TYLENOL) 325 MG tablet Take 2 tablets (650 mg total) by mouth every 6 (six) hours as needed for mild pain, moderate pain or fever (or Fever >/= 101). 02/01/23   Wieting, Richard, MD  acetaminophen (TYLENOL) 500 MG tablet Take 2 tablets (1,000 mg total) by mouth every 8 (eight) hours. 10/17/23 10/16/24  Signa Kell, MD  allopurinol (ZYLOPRIM) 100 MG tablet Take 100 mg by mouth in the morning.    [provider]  alum & mag hydroxide-simeth (MAALOX/MYLANTA) 200-200-20 MG/5ML suspension Take 10 mLs by mouth 3 (three) times daily as needed for indigestion or heartburn.    [provider]  amiodarone (PACERONE) 200 MG tablet Take 0.5 tablets (100 mg total) by mouth daily. 07/11/23   Sherie Don, NP  apixaban (ELIQUIS) 2.5 MG TABS tablet Take 1 tablet (2.5 mg total) by mouth 2 (two) times daily. 03/06/22   Sunnie Nielsen, DO  carboxymethylcellulose (REFRESH PLUS) 0.5 % SOLN 1 drop 3 (three) times daily as needed.    [provider]  Cholecalciferol (VITAMIN D) 50 MCG (2000 UT) CAPS Take 2,000 Units by mouth in the morning.    [provider]  docusate sodium (COLACE) 100 MG capsule Take 100 mg by mouth 2 (two) times daily.    [provider]  estradiol (ESTRACE) 0.1 MG/GM vaginal cream Place 1 Applicatorful vaginally every Tuesday, Thursday, and Saturday at 6 PM.    [provider]  famotidine (PEPCID) 10 MG tablet Take 10 mg by mouth in the morning.    [provider]  Fluticasone Furoate (FLONASE SENSIMIST NA) Place 1 spray into the nose in the morning.    [provider]  folic acid (FOLVITE) 1 MG tablet Take 1 tablet (1 mg total) by  mouth daily. 02/02/23   Alford Highland, MD  furosemide (LASIX) 20 MG tablet Take 1 tablet (20 mg total) by mouth daily. 02/02/23   Alford Highland, MD  gabapentin (NEURONTIN) 300 MG capsule Take 600 mg by mouth 3 (three) times daily. (0800, 1400 & 2000)    [provider]  ibandronate (BONIVA) 150 MG tablet Take 150 mg by mouth every 30 (thirty) days. Take in the morning with a full glass of water, on an empty stomach, and do not take anything else by mouth or lie down for the next 30 min.    [provider]  Insulin Regular Human (NOVOLIN R FLEXPEN RELION) 100 UNIT/ML KwikPen Inject 0-10 Units into the skin as directed. Check blood sugar 3 times daily and inject insulin per sliding scale: Less than 70=0 units and eat a snack recheck in 15 minutes; 100-120=2 units 121-150=3 units 151-200=4 units 201-250=5 units 251-300=6 units 301-350=7 units 351-400=8 units 401-450=9 units 451-500=10 units; call MD for reading greater than 500.    [provider]  LANTUS 100 UNIT/ML injection Inject 0.09 mLs (9 Units total) into the skin at bedtime. Patient taking differently: Inject 18 Units into the skin at bedtime. 02/01/23   Alford Highland, MD  levothyroxine (SYNTHROID) 75 MCG tablet Take 37.5 mcg by mouth daily before breakfast. (0600)    [provider]  magnesium hydroxide (MILK OF MAGNESIA) 400 MG/5ML suspension Take 30 mLs by mouth daily as needed (constipation).    [provider]  midodrine (PROAMATINE) 5 MG tablet Take 5  mg by mouth 3 (three) times daily with meals. (0730, 1130 & 1600) DO NOT LIE DOWN AFTER TAKING, DO NOT TAKE AFTER EVENING MEAL. TAKE LAST DOSE OF THE DAY AT LEAST 4 HOURS BEFORE BEDTIME    [provider]  Multiple Vitamin (MULTIVITAMIN WITH MINERALS) TABS tablet Take 1 tablet by mouth daily. 02/02/23   Alford Highland, MD  nystatin cream (MYCOSTATIN) Apply 1 Application topically 2 (two) times daily as needed (rash).     [provider]  nystatin powder Apply 1 Application topically in the morning and at bedtime. (0800 & 2000) Apply topically to groin    [provider]  ondansetron (ZOFRAN-ODT) 4 MG disintegrating tablet Take 1 tablet (4 mg total) by mouth every 8 (eight) hours as needed for nausea or vomiting. 10/17/23   Signa Kell, MD  Polyethyl Glycol-Propyl Glycol 0.4-0.3 % SOLN Place 1 drop into both eyes See admin instructions. Instill 1 drop into both eye scheduled twice daily (0800 & 2000) & may instill 1 drop into both eyes twice daily if needed for itching eyes.    [provider]  polyethylene glycol (MIRALAX / GLYCOLAX) 17 g packet Take 17 g by mouth daily. 02/01/23   Alford Highland, MD  rOPINIRole (REQUIP) 0.5 MG tablet Take 0.5 mg by mouth at bedtime. (2000)    [provider]  senna (SENOKOT) 8.6 MG tablet Take 1 tablet by mouth at bedtime.    [provider]  sertraline (ZOLOFT) 100 MG tablet Take 100 mg by mouth in the morning. (0800) 100 mg + 25 mg=125 mg    [provider]  sertraline (ZOLOFT) 25 MG tablet Take 25 mg by mouth in the morning. (0800) 25 mg + 100 mg=125 mg    [provider]  solifenacin (VESICARE) 5 MG tablet Take 5 mg by mouth in the morning. (0800)    [provider]  thiamine (VITAMIN B-1) 100 MG tablet Take 1 tablet (100 mg total) by mouth daily. 02/02/23   Alford Highland, MD  traMADol (ULTRAM) 50 MG tablet Take 1 tablet (50 mg total) by mouth every 6 (six) hours as needed for moderate pain. Patient taking differently: Take 50 mg by mouth 2 (two) times daily as needed (pain.). 01/26/23   Dedra Skeens, PA-C  traMADol (ULTRAM) 50 MG tablet Take 1 tablet (50 mg total) by mouth every 4 (four) hours as needed for severe pain (pain score 7-10) or moderate pain (pain score 4-6). 10/17/23 10/16/24  Signa Kell, MD  vitamin B-12 (CYANOCOBALAMIN) 1000 MCG tablet Take 1,000 mcg by mouth in the morning.    [provider]     Critical care time: 75 minutes      Zada Girt, AGNP  Pulmonary/Critical Care Pager [phone] (please enter 7 digits) PCCM Consult Pager [phone] (please enter 7 digits)

## 2023-10-28 NOTE — ED Notes (Signed)
Patient denies brief being wet

## 2023-10-28 NOTE — Consult Note (Signed)
ORTHOPAEDIC CONSULTATION  REQUESTING PHYSICIAN: [name], MD  Chief Complaint:   Right hip/thigh pain and swelling.  History of Present Illness: Deborah Obrien is a 83 y.o. female with multiple medical problems including coronary artery disease, hypertension, hypothyroidism, gastroesophageal reflux disease, stage IV kidney disease, peripheral vascular disease/carotid artery disease, gout, gastroesophageal reflux disease, fatty liver, diabetes, osteoporosis, and paroxysmal atrial fibrillation for which she is on blood thinners.  The patient is now 9 months status post an intramedullary nailing of a displaced intertrochanteric fracture of her right hip by Dr. Allena Katz.  The fracture went on to heal but had some compression/collapse across the fracture site in the process of healing, resulting in some prominence of the 2 more proximal leg screws.  These screws were quite symptomatic so the patient underwent a screw exchange on 10/17/2023, replacing the prominent screws with shorter lag screws.    The patient's postop course has been complicated by bleeding from the wound site.  She returned to the emergency room on the first postoperative day complaining of bloody drainage from her wound.  A CT scan at this time demonstrated a large hematoma around the lateral aspect of her hip in the area of the surgical incision.  After consultation with orthopedics, the decision was made to apply a compressive dressing but to keep the blood thinners going to minimize the risk of a postoperative DVT.  The patient notes that she was doing reasonably well until yesterday afternoon when she developed increased pain in her hip while ambulating with her walker prompting her to return to the emergency room yesterday evening where a repeat CT scan confirmed the presence of an enlarging hematoma but did not demonstrate any evidence of fractures or other acute  bony processes.  The patient has been admitted for pain control and further workup.  Orthopedics has been consulted for assistance with management of the postoperative hematoma.  Past Medical History:  Diagnosis Date   Acute respiratory failure (HCC) 04/09/2018   Anemia    Anxiety    Aortic atherosclerosis (HCC)    Bowel obstruction (HCC)    Carotid artery disease (HCC)    Cerebral microvascular disease    Chronic heart failure with preserved ejection fraction (HFpEF) (HCC)    a.) TTE 04/10/2018: EF 60-65%, mildly dil LA/RA, mod dil RV, mod TR, PASP ; b.) TTE 01/22/2023: EF 60-65%, no rwma, low-nl RV fxn, RVSP 51.2 mmHg. Mild MR. Mod-sev TR.   CKD (chronic kidney disease), stage IV (HCC)    Closed fracture of one rib of right side 05/29/2020   Colon polyp    Complete tear of left rotator cuff 10/16/2014   Coronary artery disease (non-obstructive) 08/25/2016   a.) LHC 08/25/2016:  25% oLM-LM, 20% mLAD, 15% o-pLCx - med mgmt; b.) MV 10/20/2017: no isch/infarct, EF 55-65%   DDD (degenerative disc disease), cervical    a.) s/p ACDF C6-C7   Depression    Diabetic neuropathy (HCC)    Diarrhea 03/04/2022   Displaced intertrochanteric fracture of right femur (HCC)    Fatty liver    Gastritis without bleeding    GERD (gastroesophageal reflux disease)    GI bleed    Gout    Hypertension    Hypothyroidism    Long term current use of amiodarone    Low back pain    history of kyphoplasty t12-l1   Morbid obesity (HCC)    Multiple gastric polyps    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   a.) presurgical  PCR (+) 10/10/2023 prior to REMOVAL OF NAIL HARDWARE WITH SCREW EXCHANGE (RIGHT HIP)   On apixaban therapy    Osteoarthritis    Osteoporosis    PAF (paroxysmal atrial fibrillation) (HCC) 2016   a.) CHA2DS2-VASc = 9 (age x 2, sex, HFpEF, HTN, TIA x 2, vascular disease, T2DM) as of 10/13/2023; b.) s/p DCCV 06/28/2017 (120 J x1), 02/06/2018 (150c J x 1), 07/15/2020 (75 J x ); c.) ardiac  rate/rhythm maintained on oral amiodarone; chronically anticoagulated using apixaban   Presence of permanent cardiac pacemaker 07/01/2015   a.) PPM placed 07/01/2015; b.) device upgraded to dual chamber MDT Azure XT DR MRI SureScan (SN: [id number]) 08/29/2017   Pulmonary hypertension (HCC)    a.) TTE 04/10/2018: PASP 50 mmHg; b.) TTE 01/22/2023: RVSP 51.2 mmHg   Sinus node dysfunction (HCC)    a.) s/p PPM placement 06/2015 --> device upgraded 08/2017   T2DM (type 2 diabetes mellitus) (HCC)    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   Uncontrolled type 2 diabetes mellitus with hypoglycemia, with long-term current use of insulin (HCC) 03/04/2022   Urinary incontinence 10/17/2022   Vascular dementia, moderate, with anxiety (HCC)    Past Surgical History:  Procedure Laterality Date   ABDOMINAL HYSTERECTOMY  1980   APPENDECTOMY     BACK SURGERY     2008. plate & screws    BACK SURGERY  2019   patient describes kyphoplasty for compression fractures, MD referral said fusion   BREAST BIOPSY Right 04/2015   stereo fibroadenomatous change, neg for atypia   BREAST EXCISIONAL BIOPSY Left    neg   CARDIAC CATHETERIZATION Left 08/25/2016   Procedure: Left Heart Cath and Coronary Angiography;  Surgeon: Lamar Blinks, MD;  Location: [facility] INVASIVE CV LAB;  Service: Cardiovascular;  Laterality: Left;   CARDIOVERSION N/A 06/28/2017   Procedure: CARDIOVERSION;  Surgeon: Lamar Blinks, MD;  Location: [facility] ORS;  Service: Cardiovascular;  Laterality: N/A;   CARDIOVERSION N/A 02/06/2018   Procedure: CARDIOVERSION;  Surgeon: Wendall Stade, MD;  Location: [facility] ENDOSCOPY;  Service: Cardiovascular;  Laterality: N/A;   CARDIOVERSION N/A 07/15/2020   Procedure: CARDIOVERSION;  Surgeon: Jake Bathe, MD;  Location: [facility] ENDOSCOPY;  Service: Cardiovascular;  Laterality: N/A;    CATARACT EXTRACTION, BILATERAL  2012   CHOLECYSTECTOMY     colon blockage  1999   COLON SURGERY  2000   removed 20% of colon. colon had collapsed   COLONOSCOPY  2016   polyps removed 2016   DORSAL COMPARTMENT RELEASE Left 09/14/2016   Procedure: RELEASE DORSAL COMPARTMENT (DEQUERVAIN);  Surgeon: Donato Heinz, MD;  Location: [facility] ORS;  Service: Orthopedics;  Laterality: Left;   ESOPHAGOGASTRODUODENOSCOPY N/A 01/27/2015   Procedure: ESOPHAGOGASTRODUODENOSCOPY (EGD);  Surgeon: Christena Deem, MD;  Location: [facility] ENDOSCOPY;  Service: Endoscopy;  Laterality: N/A;   ESOPHAGOGASTRODUODENOSCOPY (EGD) WITH PROPOFOL N/A 07/24/2015   Procedure: ESOPHAGOGASTRODUODENOSCOPY (EGD) WITH PROPOFOL;  Surgeon: Midge Minium, MD;  Location: [facility] ENDOSCOPY;  Service: Endoscopy;  Laterality: N/A;   ESOPHAGOGASTRODUODENOSCOPY (EGD) WITH PROPOFOL N/A 04/12/2018   Procedure: ESOPHAGOGASTRODUODENOSCOPY (EGD) WITH PROPOFOL;  Surgeon: Wyline Mood, MD;  Location: [facility] ENDOSCOPY;  Service: Gastroenterology;  Laterality: N/A;   ESOPHAGOGASTRODUODENOSCOPY (EGD) WITH PROPOFOL N/A 06/22/2018   Procedure: ESOPHAGOGASTRODUODENOSCOPY (EGD) WITH PROPOFOL;  Surgeon: Rachael Fee, MD;  Location: [facility] ENDOSCOPY;  Service: Endoscopy;  Laterality: N/A;   ESOPHAGOGASTRODUODENOSCOPY (EGD)  WITH PROPOFOL N/A 07/09/2018   Procedure: ESOPHAGOGASTRODUODENOSCOPY (EGD) WITH PROPOFOL with resection of gastric polyps;  Surgeon: Wyline Mood, MD;  Location: [facility] ENDOSCOPY;  Service: Gastroenterology;  Laterality: N/A;   ESOPHAGOGASTRODUODENOSCOPY (EGD) WITH PROPOFOL N/A 05/17/2019   Procedure: ESOPHAGOGASTRODUODENOSCOPY (EGD) WITH PROPOFOL;  Surgeon: Wyline Mood, MD;  Location: [facility] ENDOSCOPY;  Service: Gastroenterology;  Laterality: N/A;   EUS N/A 06/22/2018   Procedure: UPPER ENDOSCOPIC ULTRASOUND (EUS) RADIAL;  Surgeon: Rachael Fee, MD;  Location: [facility] ENDOSCOPY;  Service: Endoscopy;  Laterality: N/A;   EYE SURGERY Bilateral     cataract extractions   HARDWARE REMOVAL Right 10/17/2023   Procedure: Right hip removal of nail hardware with screw exchange;  Surgeon: Signa Kell, MD;  Location: [facility] ORS;  Service: Orthopedics;  Laterality: Right;   HUMERUS IM NAIL Left 06/02/2022   Procedure: INTRAMEDULLARY (IM) NAIL HUMERAL;  Surgeon: Christena Flake, MD;  Location: [facility] ORS;  Service: Orthopedics;  Laterality: Left;   INTRAMEDULLARY (IM) NAIL INTERTROCHANTERIC Right 01/21/2023   Procedure: INTRAMEDULLARY (IM) NAIL INTERTROCHANTERIC;  Surgeon: Signa Kell, MD;  Location: [facility] ORS;  Service: Orthopedics;  Laterality: Right;   JOINT REPLACEMENT Bilateral 2014   Bilateral Knee replacement   LEAD REVISION/REPAIR N/A 08/29/2017   Procedure: LEAD REVISION/REPAIR;  Surgeon: Regan Lemming, MD;  Location: [facility] INVASIVE CV LAB;  Service: Cardiovascular;  Laterality: N/A;   OOPHORECTOMY     PACEMAKER INSERTION Left 07/01/2015   Procedure: INSERTION PACEMAKER;  Surgeon: Marcina Millard, MD;  Location: [facility] ORS;  Service: Cardiovascular;  Laterality: Left;   PACEMAKER REVISION N/A 08/28/2017   Procedure: PACEMAKER REVISION;  Surgeon: Duke Salvia, MD;  Location: [facility] INVASIVE CV LAB;  Service: Cardiovascular;  Laterality: N/A;   REPLACEMENT TOTAL KNEE BILATERAL     SHOULDER ARTHROSCOPY WITH ROTATOR CUFF REPAIR AND OPEN BICEPS TENODESIS Left 11/21/2019   Procedure: LEFT SHOULDER ARTHROSCOPY WITH DEBRIDEMENT, DECOMPRESSION, ROTATOR CUFF REPAIR AND BICEPS TENOLYSIS;  Surgeon: Christena Flake, MD;  Location: [facility] ORS;  Service: Orthopedics;  Laterality: Left;   SHOULDER ARTHROSCOPY WITH SUBACROMIAL DECOMPRESSION Left 2013   TEE WITHOUT CARDIOVERSION N/A 06/28/2017   Procedure: TRANSESOPHAGEAL ECHOCARDIOGRAM (TEE);  Surgeon: Lamar Blinks, MD;  Location: [facility] ORS;  Service: Cardiovascular;  Laterality: N/A;   TUBAL LIGATION     Social History   Socioeconomic History   Marital status: Married    Spouse name: Demetries Coia   Number of  children: Not on file   Years of education: Not on file   Highest education level: Not on file  Occupational History   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    Comment: retired  Tobacco Use   Smoking status: Never   Smokeless tobacco: Never  Vaping Use   Vaping status: Never Used  Substance and Sexual Activity   Alcohol use: No   Drug use: No   Sexual activity: Yes  Other Topics Concern   Not on file  Social History Narrative   DIABETIC   PACEMAKER   Social Drivers of Health   Financial Resource Strain: High Risk (02/19/2019)   [facility]   Overall Financial Resource Strain (CARDIA)    Difficulty of Paying Living Expenses: Very hard  Food Insecurity: No Food Insecurity (01/20/2023)   Hunger Vital Sign    Worried About Running Out of Food in the Last Year: Never true    Ran Out of Food in the Last Year: Never true  Transportation Needs: No Transportation Needs (  01/20/2023)   PRAPARE - Administrator, Civil Service (Medical): No    Lack of Transportation (Non-Medical): No  Physical Activity: Unknown (02/19/2019)   Received [facility], [facility]   Exercise Vital Sign    Days of Exercise per Week: 0 days    Minutes of Exercise per Session: Not on file  Stress: Stress Concern Present (02/19/2019)   Received from [facility], [facility]   [employer] of Occupational Health - Occupational Stress Questionnaire    Feeling of Stress : To some extent  Social Connections: Moderately Integrated (02/19/2019)   Received from [facility], [facility]   Social Connection and Isolation Panel [NHANES]    Frequency of Communication with Friends and Family: More than three times a week    Frequency of Social Gatherings with Friends and Family: Twice a week    Attends Religious Services: 1 to 4 times per  year    Active Member of [employer] or Organizations: No    Attends Engineer, structural: Never    Marital Status: Married   Family History  Problem Relation Age of Onset   Diabetes Mellitus II Mother    CAD Mother    Heart attack Mother    Cancer Father        skin   Diabetes Mellitus II Brother    Stroke Brother    Breast cancer Neg Hx    Allergies  Allergen Reactions   Fish Allergy Shortness Of Breath    All kinds of fish. Severe vomitting even with smell of fish.    Macrolides And Ketolides Other (See Comments)    Severe stomach pain (mycins)   Meperidine Shortness Of Breath, Nausea Only and Other (See Comments)    Stomach pain    Other Nausea Only, Swelling, Rash, Anaphylaxis, Diarrhea, Shortness Of Breath and Other (See Comments)    Allergen: NON-STEROIDS, bextra - hands and feet swell  Reaction:  Stomach pain   Other Reaction: Intolerance   Prednisone Anaphylaxis and Other (See Comments)    (facial swelling/redness/burning)Increases pts blood sugar  Pt states that she is allergic to all steroids.     Shellfish Allergy Anaphylaxis, Shortness Of Breath, Diarrhea, Nausea Only and Rash    Stomach pain    Sulfa Antibiotics Shortness Of Breath, Diarrhea, Nausea Only and Other (See Comments)    Reaction:  Stomach pain     Sulfacetamide Sodium Diarrhea, Nausea Only, Other (See Comments) and Shortness Of Breath    Reaction:  Stomach pain    Telbivudine Other (See Comments)    Unknown reaction   Uloric [Febuxostat] Anaphylaxis    Locks pt's body up    Aspirin Rash and Other (See Comments)    Stomach pain (tolerates lower doses)   Celecoxib Other (See Comments)    GI bleed, weakness, and stomach pain.     Cephalexin Diarrhea, Nausea Only and Other (See Comments)    stomach pain    Codeine Diarrhea, Nausea Only and Other (See Comments)    Reaction:  Stomach pain Pt tolerates morphine    Dilaudid [Hydromorphone Hcl] Other (See Comments)    Reactions: easy to  overdose - blood pressure drops really low   Erythromycin Diarrhea, Nausea Only and Other (See Comments)    Reaction:  Stomach pain    Iodinated Contrast Media Rash and Other (See Comments)    Pt states that she is unable  to have because she has chronic kidney disease.     Lipitor [Atorvastatin Calcium] Nausea Only    Reaction: nausea, weakness, pass blood   Oxycodone Other (See Comments)    Reaction:  Stomach pain    Aleve [Naproxen]     Reaction: severe stomach pain   Atorvastatin Nausea Only and Other (See Comments)    Weakness    Cephalosporins Diarrhea, Nausea Only and Other (See Comments)    Other: abdominal pain  Tolerated 1st generation cephalosporin (CEFAZOLIN) on 06/02/2022 and 01/21/2023 without documented ADRs.    Doxycycline     Stomach pain    Iodine Rash   Motrin [Ibuprofen]     Reaction: severe stomach pain   Tape Other (See Comments)    Causes pts skin to tear  Pt states that she is able to use paper tape.       Valdecoxib Swelling and Other (See Comments)    Pt states that her hands and feet swell.     Prior to Admission medications   Medication Sig Start Date End Date Taking? Authorizing Provider  acetaminophen (TYLENOL) 325 MG tablet Take 2 tablets (650 mg total) by mouth every 6 (six) hours as needed for mild pain, moderate pain or fever (or Fever >/= 101). 02/01/23  Yes Wieting, Richard, MD  acetaminophen (TYLENOL) 500 MG tablet Take 2 tablets (1,000 mg total) by mouth every 8 (eight) hours. 10/17/23 10/16/24 Yes Signa Kell, MD  albuterol (VENTOLIN HFA) 108 (90 Base) MCG/ACT inhaler Inhale 2 puffs into the lungs every 6 (six) hours as needed for wheezing or shortness of breath (cough).   Yes [provider]  allopurinol (ZYLOPRIM) 100 MG tablet Take 100 mg by mouth in the morning.   Yes [provider]  alum & mag hydroxide-simeth (MAALOX/MYLANTA) 200-200-20 MG/5ML suspension Take 10 mLs by mouth 3 (three) times daily as needed for indigestion  or heartburn.   Yes [provider]  amiodarone (PACERONE) 200 MG tablet Take 0.5 tablets (100 mg total) by mouth daily. 07/11/23  Yes Sherie Don, NP  apixaban (ELIQUIS) 2.5 MG TABS tablet Take 1 tablet (2.5 mg total) by mouth 2 (two) times daily. 03/06/22  Yes Sunnie Nielsen, DO  Cholecalciferol (VITAMIN D) 50 MCG (2000 UT) CAPS Take 2,000 Units by mouth in the morning.   Yes [provider]  docusate sodium (COLACE) 100 MG capsule Take 100 mg by mouth 2 (two) times daily.   Yes [provider]  Ergocalciferol (VITAMIN D2) 50 MCG (2000 UT) TABS Take 1 tablet by mouth daily.   Yes [provider]  estradiol (ESTRACE) 0.1 MG/GM vaginal cream Place 1 Applicatorful vaginally every Tuesday, Thursday, and Saturday at 6 PM.   Yes [provider]  famotidine (PEPCID) 10 MG tablet Take 10 mg by mouth in the morning.   Yes [provider]  Fluticasone Furoate (FLONASE SENSIMIST NA) Place 1 spray into the nose in the morning.   Yes [provider]  furosemide (LASIX) 20 MG tablet Take 1 tablet (20 mg total) by mouth daily. 02/02/23  Yes Wieting, Richard, MD  gabapentin (NEURONTIN) 300 MG capsule Take 600 mg by mouth 3 (three) times daily. (0800, 1400 & 2000)   Yes [provider]  ibandronate (BONIVA) 150 MG tablet Take 150 mg by mouth every 30 (thirty) days. Take in the morning with a full glass of water, on an empty stomach, and do not take anything else by mouth or lie down for the  next 30 min.   Yes [provider]  Insulin Regular Human (NOVOLIN R FLEXPEN RELION) 100 UNIT/ML KwikPen Inject 0-10 Units into the skin as directed. Check blood sugar 3 times daily and inject insulin per sliding scale: Less than 70=0 units and eat a snack recheck in 15 minutes; 100-120=2 units 121-150=3 units 151-200=4 units 201-250=5 units 251-300=6 units 301-350=7 units 351-400=8 units 401-450=9 units 451-500=10 units; call MD for  reading greater than 500.   Yes [provider]  LANTUS 100 UNIT/ML injection Inject 0.09 mLs (9 Units total) into the skin at bedtime. Patient taking differently: Inject 19 Units into the skin at bedtime. 02/01/23  Yes Wieting, Richard, MD  levothyroxine (SYNTHROID) 75 MCG tablet Take 37.5 mcg by mouth daily before breakfast. (0600)   Yes [provider]  magnesium hydroxide (MILK OF MAGNESIA) 400 MG/5ML suspension Take 30 mLs by mouth daily as needed (constipation).   Yes [provider]  midodrine (PROAMATINE) 5 MG tablet Take 5 mg by mouth 3 (three) times daily with meals. (0730, 1130 & 1600) DO NOT LIE DOWN AFTER TAKING, DO NOT TAKE AFTER EVENING MEAL. TAKE LAST DOSE OF THE DAY AT LEAST 4 HOURS BEFORE BEDTIME   Yes [provider]  Multiple Vitamin (MULTIVITAMIN WITH MINERALS) TABS tablet Take 1 tablet by mouth daily. 02/02/23  Yes Wieting, Richard, MD  nystatin cream (MYCOSTATIN) Apply 1 Application topically 2 (two) times daily as needed (rash).   Yes [provider]  nystatin powder Apply 1 Application topically in the morning and at bedtime. (0800 & 2000) Apply topically to groin   Yes [provider]  ondansetron (ZOFRAN-ODT) 4 MG disintegrating tablet Take 1 tablet (4 mg total) by mouth every 8 (eight) hours as needed for nausea or vomiting. 10/17/23  Yes Signa Kell, MD  polyethylene glycol (MIRALAX / GLYCOLAX) 17 g packet Take 17 g by mouth daily. 02/01/23  Yes Wieting, Richard, MD  Polyvinyl Alcohol-Povidone PF 1.4-0.6 % SOLN Place 1 drop into both eyes in the morning and at bedtime.   Yes [provider]  rOPINIRole (REQUIP) 0.5 MG tablet Take 0.5 mg by mouth at bedtime. (2000)   Yes [provider]  senna (SENOKOT) 8.6 MG tablet Take 1 tablet by mouth at bedtime.   Yes [provider]  sertraline (ZOLOFT) 100 MG tablet Take 100 mg by mouth in the morning. (0800) 100 mg + 25 mg=125 mg   Yes [provider]  sertraline (ZOLOFT) 25 MG tablet Take 25 mg by mouth in the morning. (0800) 25 mg + 100 mg=125 mg   Yes [provider]  solifenacin (VESICARE) 5 MG tablet Take 5 mg by mouth in the morning. (0800)   Yes [provider]  thiamine (VITAMIN B-1) 100 MG tablet Take 1 tablet (100 mg total) by mouth daily. 02/02/23  Yes Wieting, Richard, MD  traMADol (ULTRAM) 50 MG tablet Take 1 tablet (50 mg total) by mouth every 6 (six) hours as needed for moderate pain. Patient taking differently: Take 50 mg by mouth 2 (two) times daily as needed (pain.). 01/26/23  Yes Dedra Skeens, PA-C  carboxymethylcellulose (REFRESH PLUS) 0.5 % SOLN 1 drop 3 (three) times daily as needed. Patient not taking: Reported on 10/28/2023    [provider]  folic acid (FOLVITE) 1 MG tablet Take 1 tablet (1 mg total) by mouth daily. Patient not taking: Reported on 10/28/2023 02/02/23   Alford Highland, MD  Polyethyl Glycol-Propyl Glycol 0.4-0.3 % SOLN  Place 1 drop into both eyes See admin instructions. Instill 1 drop into both eye scheduled twice daily (0800 & 2000) & may instill 1 drop into both eyes twice daily if needed for itching eyes. Patient not taking: Reported on 10/28/2023    [provider]  traMADol (ULTRAM) 50 MG tablet Take 1 tablet (50 mg total) by mouth every 4 (four) hours as needed for severe pain (pain score 7-10) or moderate pain (pain score 4-6). Patient not taking: Reported on 10/28/2023 10/17/23 10/16/24  Signa Kell, MD  vitamin B-12 (CYANOCOBALAMIN) 1000 MCG tablet Take 1,000 mcg by mouth in the morning. Patient not taking: Reported on 10/28/2023    [provider]   [place] EKG SITE RITE Result Date: 10/28/2023 If Site Rite image not attached, placement could not be confirmed due to current cardiac rhythm.  CT ABDOMEN PELVIS WO CONTRAST Result Date: 10/28/2023 CLINICAL DATA:  Abdominal pain, acute, nonlocalized. Right hip pain. EXAM: CT ABDOMEN AND PELVIS WITHOUT CONTRAST  TECHNIQUE: Multidetector CT imaging of the abdomen and pelvis was performed following the standard protocol without IV contrast. RADIATION DOSE REDUCTION: This exam was performed according to the departmental dose-optimization program which includes automated exposure control, adjustment of the mA and/or kV according to patient size and/or use of iterative reconstruction technique. COMPARISON:  CT scan pelvis from 04/22/2023 and CT scan abdomen and pelvis from 03/04/2022. FINDINGS: Lower chest: There are subpleural atelectatic changes in the visualized lung bases. No overt consolidation. No pleural effusion. The heart is normal in size. No pericardial effusion. Partially seen cardiac pacemaker wires. Hepatobiliary: The liver is normal in size. Non-cirrhotic configuration. No suspicious mass. No intrahepatic bile duct dilation. There is mild prominence of the extrahepatic bile duct, most likely due to post cholecystectomy status. Gallbladder is surgically absent. Pancreas: Unremarkable. No pancreatic ductal dilatation or surrounding inflammatory changes. Spleen: Within normal limits. No focal lesion. Adrenals/Urinary Tract: Adrenal glands are unremarkable. No suspicious renal mass within the limitations of this unenhanced exam. No nephroureterolithiasis or obstructive uropathy. Urinary bladder is under distended, precluding optimal assessment. However, no large mass or stones identified. No perivesical fat stranding. Stomach/Bowel: No disproportionate dilation of the small or large bowel loops. No evidence of abnormal bowel wall thickening or inflammatory changes. The appendix is surgically absent. There are scattered diverticula mainly in the sigmoid colon, without imaging signs of diverticulitis. Vascular/Lymphatic: No ascites or pneumoperitoneum. No abdominal or pelvic lymphadenopathy, by size criteria. No aneurysmal dilation of the major abdominal arteries. There are moderate peripheral atherosclerotic vascular  calcifications of the aorta and its major branches. Reproductive: The uterus is surgically absent. No large adnexal mass. Other: The visualized soft tissues and abdominal wall are unremarkable. Redemonstration of right proximal thigh intramuscular hematoma, better evaluated on the dedicated right hip CT scan performed earlier the same day. The soft tissues and abdominal wall are otherwise unremarkable. Musculoskeletal: No suspicious osseous lesions. There are moderate multilevel degenerative changes in the visualized spine. T12 and L1 vertebroplasty changes noted. There is posterior spinal fixation of L4 through S1 with transpedicular screws and rods. Postsurgical changes of right hip ORIF noted. IMPRESSION: 1. No acute inflammatory process identified within the abdomen or pelvis. 2. Redemonstration of right proximal thigh intramuscular hematoma. Please refer to separately dictated CT scan right hip performed earlier the same day report for details. 3. Multiple other nonacute observations, as described above. Aortic Atherosclerosis (ICD10-I70.0). Electronically Signed   By: Jules Schick M.D.   On: 10/28/2023 09:25   CT Hip  Right Wo Contrast Result Date: 10/28/2023 CLINICAL DATA:  Status post right hip ORIF with excessive postoperative pain. EXAM: CT OF THE RIGHT HIP WITHOUT CONTRAST TECHNIQUE: Multidetector CT imaging of the right hip was performed according to the standard protocol. Multiplanar CT image reconstructions were also generated. RADIATION DOSE REDUCTION: This exam was performed according to the departmental dose-optimization program which includes automated exposure control, adjustment of the mA and/or kV according to patient size and/or use of iterative reconstruction technique. COMPARISON:  None Available. FINDINGS: Bones/Joint/Cartilage Status post right hip ORIF with femoral fracture fragments in near anatomic alignment. No acute fracture or dislocation. Moderate right hip degenerative arthritis  with joint space narrowing and osteophyte formation. Ligaments Suboptimally assessed by CT. Muscles and Tendons The previously identified intramuscular hematoma within the anterolateral right thigh encasing the proximal right femoral shaft and centered within the vastus intermedius demonstrates interval increase in size. While difficult to exactly delineate given noncontrast technique and incompletely included, this measures at least 7.5 x 6.7 x 12.9 cm in greatest dimension. Degenerative enthesopathy is seen involving the origin of the instruments with cortical changes suggesting remote avulsion injury. Soft tissues Postsurgical changes are seen within the posterolateral right thigh with surgical skin staples partially visualized. Mild vascular calcification. IMPRESSION: 1. Status post right hip ORIF with femoral fracture fragments in near anatomic alignment. No acute fracture or dislocation. 2. Interval increase in size of the previously identified intramuscular hematoma within the anterolateral right thigh encasing the proximal right femoral shaft and centered within the vastus intermedius. While difficult to exactly delineate given noncontrast technique and incompletely included, this measures at least 7.5 x 6.7 x 12.9 cm in greatest dimension. 3. Moderate right hip degenerative arthritis.  Recur Electronically Signed   By: Helyn Numbers M.D.   On: 10/28/2023 02:28   US Venous Img Lower Unilateral Right Result Date: 10/28/2023 CLINICAL DATA:  Right hip pain following hardware removal EXAM: RIGHT LOWER EXTREMITY VENOUS DOPPLER ULTRASOUND TECHNIQUE: Gray-scale sonography with graded compression, as well as color Doppler and duplex ultrasound were performed to evaluate the lower extremity deep venous systems from the level of the common femoral vein and including the common femoral, femoral, profunda femoral, popliteal and calf veins including the posterior tibial, peroneal and gastrocnemius veins when visible.  The superficial great saphenous vein was also interrogated. Spectral Doppler was utilized to evaluate flow at rest and with distal augmentation maneuvers in the common femoral, femoral and popliteal veins. COMPARISON:  None Available. FINDINGS: Contralateral Common Femoral Vein: Respiratory phasicity is normal and symmetric with the symptomatic side. No evidence of thrombus. Normal compressibility. Common Femoral Vein: No evidence of thrombus. Normal compressibility, respiratory phasicity and response to augmentation. Saphenofemoral Junction: No evidence of thrombus. Normal compressibility and flow on color Doppler imaging. Profunda Femoral Vein: No evidence of thrombus. Normal compressibility and flow on color Doppler imaging. Femoral Vein: No evidence of thrombus. Normal compressibility, respiratory phasicity and response to augmentation. Popliteal Vein: No evidence of thrombus. Normal compressibility, respiratory phasicity and response to augmentation. Calf Veins: No evidence of thrombus. Normal compressibility and flow on color Doppler imaging. Superficial Great Saphenous Vein: No evidence of thrombus. Normal compressibility. Venous Reflux:  None. Other Findings:  None. IMPRESSION: No evidence of deep venous thrombosis. Electronically Signed   By: Alcide Clever M.D.   On: 10/28/2023 00:31    Positive ROS: All other systems have been reviewed and were otherwise negative with the exception of those mentioned in the HPI and as above.  Physical  Exam: General:  Alert, no acute distress Psychiatric:  Patient is competent for consent with normal mood and affect   Cardiovascular:  No pedal edema Respiratory:  No wheezing, non-labored breathing GI:  Abdomen is soft and non-tender Skin:  No lesions in the area of chief complaint Neurologic:  Sensation intact distally Lymphatic:  No axillary or cervical lymphadenopathy  Orthopedic Exam:  Orthopedic examination is limited to the right hip and lower extremity.   The right lower extremity is symmetrically aligned to the left.  Skin inspection around the right hip is notable for moderate swelling and ecchymosis, as well as a firm area of fullness in the proximal lateral thigh, consistent with a hematoma/seroma.  Her surgical incisions appear to be healing well and are without evidence for infection.  No erythema, abrasions, or other skin abnormalities are identified.  She has moderate tenderness to palpation over the lateral aspect of the hip.  She has more severe pain with any attempted active or passive motion of the hip.  She is grossly neurovascularly intact to the right lower extremity and foot.  X-rays:  A CT scan of the pelvis and right hip is available for review and has been reviewed by myself.  The findings are as described above.  Assessment: Enlarging postoperative right lateral hip/thigh hematoma status post lag screw exchange on 10/17/23.  Plan: The treatment options have been discussed with the patient.  At this point, the first order of business is to stop the bleeding into the thigh.  Therefore, the blood thinners need to be stopped and her bleeding time, PT/INR, and other bleeding parameters need to be assessed and corrected if necessary.    The next step would be to consult Interventional Radiology to see if they would be able to drain the hematoma, and to insert a pigtail catheter to reduce the reaccumulation of blood.  If this procedure is successful, then she will not need to be brought back to the operating room for a more formal open evacuation of the hematoma.  Thank you for asking me to participate in the care of this most pleasant young unfortunate woman.  I will make Dr. Allena Katz aware of her readmission and we will continue to follow her with you.   Maryagnes Amos, MD  Beeper #:  [phone]  10/28/2023 2:29 PM

## 2023-10-28 NOTE — Progress Notes (Signed)
Peripherally Inserted Central Catheter Placement  The IV Nurse has discussed with the patient and/or persons authorized to consent for the patient, the purpose of this procedure and the potential benefits and risks involved with this procedure.  The benefits include less needle sticks, lab draws from the catheter, and the patient may be discharged home with the catheter. Risks include, but not limited to, infection, bleeding, blood clot (thrombus formation), and puncture of an artery; nerve damage and irregular heartbeat and possibility to perform a PICC exchange if needed/ordered by physician.  Alternatives to this procedure were also discussed.  Bard Power PICC patient education guide, fact sheet on infection prevention and patient information card has been provided to patient /or left at bedside. PICC placed by Chari Manning, RN.   PICC Placement Documentation  PICC Double Lumen 10/28/23 Right Basilic 35 cm 0 cm (Active)  Indication for Insertion or Continuance of Line Vasoactive infusions 10/28/23 1831  Exposed Catheter (cm) 0 cm 10/28/23 1831  Site Assessment Clean, Dry, Intact 10/28/23 1831  Lumen #1 Status Saline locked;Blood return noted 10/28/23 1831  Lumen #2 Status Saline locked;Blood return noted 10/28/23 1831  Dressing Type Transparent;Securing device 10/28/23 1831  Dressing Status Antimicrobial disc/dressing in place;Clean, Dry, Intact 10/28/23 1831  Line Care Connections checked and tightened 10/28/23 1831  Line Adjustment (NICU/IV Team Only) No 10/28/23 1831  Dressing Intervention New dressing;Adhesive placed at insertion site (IV team only) 10/28/23 1831  Dressing Change Due 11/04/23 10/28/23 1831       Deborah Obrien 10/28/2023, 6:32 PM

## 2023-10-28 NOTE — ED Notes (Signed)
 Lab called to obtain blood cultures.

## 2023-10-28 NOTE — ED Notes (Signed)
Lab reports trying three times with no success at obtaining blood cultures

## 2023-10-28 NOTE — Progress Notes (Signed)
Dear Doctor:  This patient has been identified as a candidate for a PICC line for the following reason (s): IV therapy over 48 hours, poor veins/poor circulatory system, restarts due to phlebitis and infiltration in 24 hours, and incompatible medications.  If you agree, please write an order for the indicated device.  Thank you for supporting the early vascular access assessment program.

## 2023-10-28 NOTE — Progress Notes (Signed)
Bedside RN states new PICC tubing has been hung.

## 2023-10-28 NOTE — IPAL (Signed)
  Interdisciplinary Goals of Care Family Meeting   Date carried out: 10/28/2023  Location of the meeting: Bedside  Member's involved: Physician, Nurse Practitioner, and Bedside Registered Nurse  Durable Power of Attorney or acting medical decision maker: Pt Deborah Obrien     Discussion: We discussed goals of care for Deborah Obrien .  Pt with DNR paperwork on her chart, however her code status is listed as FULL CODE.  During discussions regarding code status pt became very tearful and stated "I want to live."  I asked Deborah Obrien if she would want CPR/ACLS medications/mechanical intubation in the event she were to cardiac arrest.  She stated she would want all of the interventions to keep her alive.  She states she has been intubated before.  Therefore, code status remains Full Code per pt request  Code status:   Code Status: Full Code   Disposition: Continue current acute care  Time spent for the meeting: 10 minutes    Zada Girt, AGNP  Pulmonary/Critical Care Pager [phone] (please enter 7 digits) PCCM Consult Pager [phone] (please enter 7 digits)

## 2023-10-29 DIAGNOSIS — J9601 Acute respiratory failure with hypoxia: Secondary | ICD-10-CM | POA: Diagnosis not present

## 2023-10-29 DIAGNOSIS — I959 Hypotension, unspecified: Secondary | ICD-10-CM

## 2023-10-29 DIAGNOSIS — N179 Acute kidney failure, unspecified: Secondary | ICD-10-CM | POA: Diagnosis not present

## 2023-10-29 DIAGNOSIS — S7001XA Contusion of right hip, initial encounter: Secondary | ICD-10-CM | POA: Diagnosis not present

## 2023-10-29 LAB — BASIC METABOLIC PANEL
Anion gap: 10 (ref 5–15)
BUN: 36 mg/dL — ABNORMAL HIGH (ref 8–23)
CO2: 23 mmol/L (ref 22–32)
Calcium: 7.4 mg/dL — ABNORMAL LOW (ref 8.9–10.3)
Chloride: 105 mmol/L (ref 98–111)
Creatinine, Ser: 1.44 mg/dL — ABNORMAL HIGH (ref 0.44–1.00)
GFR, Estimated: 36 mL/min — ABNORMAL LOW (ref >60)
Glucose, Bld: 212 mg/dL — ABNORMAL HIGH (ref 70–99)
Potassium: 4.8 mmol/L (ref 3.5–5.1)
Sodium: 138 mmol/L (ref 135–145)

## 2023-10-29 LAB — PROTIME-INR
INR: 1.1 (ref 0.8–1.2)
Prothrombin Time: 14.5 s (ref 11.4–15.2)

## 2023-10-29 LAB — GLUCOSE, CAPILLARY
Glucose-Capillary: 249 mg/dL — ABNORMAL HIGH (ref 70–99)
Glucose-Capillary: 157 mg/dL — ABNORMAL HIGH (ref 70–99)
Glucose-Capillary: 110 mg/dL — ABNORMAL HIGH (ref 70–99)
Glucose-Capillary: 292 mg/dL — ABNORMAL HIGH (ref 70–99)

## 2023-10-29 LAB — BLOOD GAS, ARTERIAL
Acid-base deficit: 1.1 mmol/L (ref 0.0–2.0)
Bicarbonate: 24.8 mmol/L (ref 20.0–28.0)
O2 Content: 2 L/min
O2 Saturation: 99.2 %
Patient temperature: 37
pCO2 arterial: 45 mm[Hg] (ref 32–48)
pH, Arterial: 7.35 (ref 7.35–7.45)
pO2, Arterial: 98 mm[Hg] (ref 83–108)

## 2023-10-29 LAB — MAGNESIUM: Magnesium: 1.9 mg/dL (ref 1.7–2.4)

## 2023-10-29 LAB — HEMOGLOBIN AND HEMATOCRIT, BLOOD
HCT: 28.5 % — ABNORMAL LOW (ref 36.0–46.0)
HCT: 27.8 % — ABNORMAL LOW (ref 36.0–46.0)
HCT: 28.2 % — ABNORMAL LOW (ref 36.0–46.0)
HCT: 28.1 % — ABNORMAL LOW (ref 36.0–46.0)
Hemoglobin: 9.1 g/dL — ABNORMAL LOW (ref 12.0–15.0)
Hemoglobin: 9 g/dL — ABNORMAL LOW (ref 12.0–15.0)
Hemoglobin: 9 g/dL — ABNORMAL LOW (ref 12.0–15.0)
Hemoglobin: 8.8 g/dL — ABNORMAL LOW (ref 12.0–15.0)

## 2023-10-29 LAB — APTT: aPTT: 40 s — ABNORMAL HIGH (ref 24–36)

## 2023-10-29 LAB — PLATELET FUNCTION ASSAY: Collagen / Epinephrine: 106 s (ref 0–193)

## 2023-10-29 LAB — PHOSPHORUS: Phosphorus: 3.5 mg/dL (ref 2.5–4.6)

## 2023-10-29 LAB — CORTISOL: Cortisol, Plasma: 7.3 ug/dL

## 2023-10-29 MED ORDER — MUPIROCIN 2 % EX OINT
1.0000 | TOPICAL_OINTMENT | Freq: Two times a day (BID) | CUTANEOUS | Status: AC
  Administered 2023-10-29 – 2023-11-02 (×10): 1 via NASAL
  Filled 2023-10-29 (×2): qty 22

## 2023-10-29 MED ORDER — INSULIN ASPART 100 UNIT/ML IJ SOLN
0.0000 [IU] | Freq: Three times a day (TID) | INTRAMUSCULAR | Status: DC
  Administered 2023-10-29: 2 [IU] via SUBCUTANEOUS
  Administered 2023-10-30: 3 [IU] via SUBCUTANEOUS
  Administered 2023-10-30: 5 [IU] via SUBCUTANEOUS
  Administered 2023-10-30 – 2023-10-31 (×2): 3 [IU] via SUBCUTANEOUS
  Administered 2023-10-31: 5 [IU] via SUBCUTANEOUS
  Administered 2023-10-31: 3 [IU] via SUBCUTANEOUS
  Administered 2023-11-01 (×2): 5 [IU] via SUBCUTANEOUS
  Administered 2023-11-01 – 2023-11-02 (×2): 2 [IU] via SUBCUTANEOUS
  Administered 2023-11-02: 3 [IU] via SUBCUTANEOUS
  Administered 2023-11-03 (×2): 1 [IU] via SUBCUTANEOUS
  Administered 2023-11-05 – 2023-11-06 (×3): 2 [IU] via SUBCUTANEOUS
  Filled 2023-10-29 (×19): qty 1

## 2023-10-29 MED ORDER — FENTANYL CITRATE (PF) 100 MCG/2ML IJ SOLN: 100.0000 ug | Freq: Once | INTRAMUSCULAR | Status: DC

## 2023-10-29 MED ORDER — OXYCODONE HCL 5 MG PO TABS
5.0000 mg | ORAL_TABLET | Freq: Four times a day (QID) | ORAL | Status: DC | PRN
  Administered 2023-10-30 – 2023-11-05 (×4): 5 mg via ORAL
  Filled 2023-10-29 (×4): qty 1

## 2023-10-29 MED ORDER — GUAIFENESIN 100 MG/5ML PO LIQD
5.0000 mL | ORAL | Status: DC | PRN
  Administered 2023-10-29 – 2023-11-05 (×6): 5 mL via ORAL
  Filled 2023-10-29 (×6): qty 10

## 2023-10-29 MED ORDER — INSULIN ASPART 100 UNIT/ML IJ SOLN
0.0000 [IU] | Freq: Every day | INTRAMUSCULAR | Status: DC
  Administered 2023-10-29: 3 [IU] via SUBCUTANEOUS
  Administered 2023-10-31: 2 [IU] via SUBCUTANEOUS
  Filled 2023-10-29 (×3): qty 1

## 2023-10-29 NOTE — Plan of Care (Signed)
  Problem: Education: Goal: Ability to describe self-care measures that may prevent or decrease complications (Diabetes Survival Skills Education) will improve Outcome: Progressing   Problem: Coping: Goal: Ability to adjust to condition or change in health will improve Outcome: Progressing   Problem: Fluid Volume: Goal: Ability to maintain a balanced intake and output will improve Outcome: Progressing   Problem: Metabolic: Goal: Ability to maintain appropriate glucose levels will improve Outcome: Progressing   Problem: Nutritional: Goal: Maintenance of adequate nutrition will improve Outcome: Progressing   Problem: Skin Integrity: Goal: Risk for impaired skin integrity will decrease Outcome: Progressing   Problem: Tissue Perfusion: Goal: Adequacy of tissue perfusion will improve Outcome: Progressing   Problem: Clinical Measurements: Goal: Ability to maintain clinical measurements within normal limits will improve Outcome: Progressing Goal: Will remain free from infection Outcome: Progressing Goal: Diagnostic test results will improve Outcome: Progressing Goal: Respiratory complications will improve Outcome: Progressing Goal: Cardiovascular complication will be avoided Outcome: Progressing   Problem: Activity: Goal: Risk for activity intolerance will decrease Outcome: Progressing   Problem: Nutrition: Goal: Adequate nutrition will be maintained Outcome: Progressing   Problem: Coping: Goal: Level of anxiety will decrease Outcome: Progressing   Problem: Elimination: Goal: Will not experience complications related to bowel motility Outcome: Progressing Goal: Will not experience complications related to urinary retention Outcome: Progressing   Problem: Pain Managment: Goal: General experience of comfort will improve and/or be controlled Outcome: Progressing   Problem: Safety: Goal: Ability to remain free from injury will improve Outcome: Progressing    Problem: Skin Integrity: Goal: Risk for impaired skin integrity will decrease Outcome: Progressing

## 2023-10-29 NOTE — Consult Note (Signed)
PHARMACY CONSULT NOTE - ELECTROLYTES  Pharmacy Consult for Electrolyte Monitoring and Replacement   Recent Labs: Height: 5\' 3"  (160 cm) Weight: 72.5 kg (159 lb 13.3 oz) IBW/kg (Calculated) : 52.4Estimated Creatinine Clearance: 28.2 mL/min (A) (by C-G formula based on SCr of 1.44 mg/dL (H)).  Potassium (mmol/L)  Date Value  10/29/2023 4.8  10/16/2014 4.6   Magnesium (mg/dL)  Date Value  09/81/1914 1.9   Calcium (mg/dL)  Date Value  78/29/5621 7.4 (L)   Calcium, Total (mg/dL)  Date Value  30/86/5784 8.5   Albumin (g/dL)  Date Value  69/62/9528 3.3 (L)  07/03/2023 4.4  10/29/2012 3.9   Phosphorus (mg/dL)  Date Value  41/32/4401 3.5   Sodium (mmol/L)  Date Value  10/29/2023 138  07/03/2023 137  10/16/2014 141   Assessment  Deborah Obrien is a 83 y.o. female presenting with hypoglycemia. PMH significant for CAD, HFpEF, atrial fibrillation, SA node dysfunction/SSS (s/p PPM placement). BILATERAL carotid artery disease, TIA, chronic cerebral microvascular disease, aortic atherosclerosis, HTN, HLD, T2DM, hypothyroidism, CKD-IV, pulmonary hypertension, GERD (on daily H2 blocker), gastritis, GI bleeding,  anemia, OA, cervical DDD (s/p ACDF C6-C7), chronic back pain, peripheral neuropathy, anxiety, depression. Pharmacy has been consulted to monitor and replace electrolytes.  Diet: NPO MIVF: None Pertinent medications: None  Goal of Therapy: Electrolytes WNL  Plan:  No replacement needed F/u with AM labs.   Thank you for allowing pharmacy to be a part of this patient's care.  Ronnald Ramp, PharmD 10/29/2023 9:43 AM

## 2023-10-29 NOTE — Progress Notes (Signed)
Was notified of readmission and inability of IR to perform drainage.   I will plan to re-evaluate patient tomorrow AM for possible surgical evacuation. Please keep NPO after midnight. Order placed.

## 2023-10-29 NOTE — Plan of Care (Signed)

## 2023-10-29 NOTE — Progress Notes (Signed)
NAME:  Deborah Obrien, MRN:  [id number], DOB:  1940/09/30, LOS: 1 ADMISSION DATE:  10/27/2023, CONSULTATION DATE: 10/28/2023 REFERRING MD: Dr. Elesa Massed, CHIEF COMPLAINT: Right hip pain    History of Present Illness:  This is an 83 yo female who presented to [facility] ER on 02/14 via EMS from [facility] with c/o right hip and right sided abdominal pain.  She recently presented to [facility] ER on 02/5 with postoperative bleeding following an outpatient right hip removal of nail hardware with screw exchange on 02/4.  She resumed her outpatient apixaban post procedure.  During that ER presentation CT Right Hip revealed intramuscular hematoma.  EDP contacted on call orthopedic surgeon Dr. Audelia Acton and he recommended hip spica Ace wrap pressure dressing, continuing apixaban to prevent blood clots.  Pt discharged back to [facility].  However, she later developed severe right hip pain prompting return to the ER.    ED Course  Upon arrival to the ER pt hypotensive map's 54 to 60's.  She received 1L NS bolus, however she remained hypotensive requiring peripheral levophed gtt.  Significant lab results were: BUN 30/creatinine 1.35/calcium 8.5/magnesium 1.6/alk phos 135/albumin 3.3/AST 44/hgb 9.7.  CT Right Hip revealed interval increase in size of the previously identified intramuscular hematoma within the anterolateral right thigh encasing the proximal right femoral shaft and centered within the vastus intermedius.  Her last dose of apixaban was the evening of 02/14.  Pt also noted to have severe right abdominal pain.  CT Abd Pelvis negative for acute inflammatory process within the abdomen or pelvis.  PCCM contacted for ICU admission.  Orthopedic surgeon Dr. Joice Lofts consulted to assist with management.    Pertinent  Medical History  Anemia  Anxiety  Aortic Atherosclerosis  Bowel Obstruction  CAD Cerebral Microvascular Disease HFrEF  CKD Stage IV  Colon Polyp  Degenerative Disc Disease  Depression  Diabetic  Neuropathy  Fatty Liver  GERD  GI Bleed  Gout  HTN  Hypothyroidism  Paroxysmal Atrial Fibrillation (pt on apixaban) Permanent Cardiac Pacemaker  Pulmonary Hypertension  Type II Diabetes Mellitus  TIA  Vascular Dementia   Significant Hospital Events: Including procedures, antibiotic start and stop dates in addition to other pertinent events   02/15: Pt admitted to ICU with hemorrhagic shock secondary to right hip intramuscular hematoma after resuming apixaban following right hip removal of nail hardware with screw exchange on 02/4 2/16: IR consulted for possible drainage of right thigh/hip hematoma and insertion of pigtail catheter to reduce reaccumulation of blood per ortho recommendations.  Pt remains on levophed gtt @6  mcg/min, requirements have increased.  Respiratory viral panel positive for RSV.  Per IR aspiration and drain placement will not drain the hematoma because it is intramuscular and a solid clot.  Ortho Dr. Joice Lofts stated he will discuss case with Ortho Dr. Allena Katz tomorrow to determine plan of care  Interim History / Subjective:  Pt c/o 4 out of 10 right thigh/hip pain c/o persistent cough   Objective   Blood pressure (!) 120/41, pulse 60, temperature 99.7 F (37.6 C), temperature source Oral, resp. rate 18, height 5\' 3"  (1.6 m), weight 72.5 kg, SpO2 100%.        Intake/Output Summary (Last 24 hours) at 10/29/2023 0809 Last data filed at 10/29/2023 0600 Gross per 24 hour  Intake 268.58 ml  Output 150 ml  Net 118.58 ml   Filed Weights   10/28/23 1638 10/29/23 0500  Weight: 74.4 kg 72.5 kg    Examination: General: Acutely-ill appearing  female, NAD on 2L O2 via nasal canula  HENT: Supple, no JVD  Lungs: Rhonchi throughout, even, non labored  Cardiovascular: Paced rhythm, no m/r/g.  2+ radial/1+ distal pulses, 1+ right hip edema  Abdomen: +BS x4, obese, right quadrant tenderness with palpation  Extremities: Moves all extremities, limited right hip ROM due to  moderate pain  Skin: Right surgical incision site with ecchymosis, RLQ ecchymosis  Neuro: Alert and oriented, following commands, PERRLA GU: Purewick in place draining yellow urine   Resolved Hospital Problem list     Assessment & Plan:   #Acute pain  - Prn tylenol, percocet and fentanyl for pain management   #Acute respiratory failure secondary to RSV - Supplemental O2 for dyspnea and/or hypoxia  - Maintain O2 sats 92% or higher  - Prn bronchodilator therapy  - Will start prn robitussin  - Aggressive pulmonary hygiene   #HFrEF Hx: CAD, permanent pacemaker, HTN, paroxysmal atrial fibrillation, and pulmonary hypertension  - Continuous telemetry monitoring  - Hold outpatient lasix  - Continue outpatient midodrine   #Acute kidney injury secondary to ATN~improving   #Hypomagnesia  - Trend BMP  - Replace electrolytes as indicated  - Strict I&O's  - Avoid nephrotoxic agents as able   #Right hip intramuscular hematoma secondary to anticoagulation post right hip removal of nail hardware with screw exchange on 02/4 #Hemorrhagic shock  - Prn pRBC's and/or levophed gtt to maintain map 65 or higher  - Apixaban reversed with kcentra~02/15  - Avoid chemical VTE px for now - Trend H&H  - Orthopedic surgery consulted appreciate input: recommended IR consult for drainage of hematoma with drain placement  - IR reviewed imaging due to solid acute clot and intramuscular hematoma the hematoma will not drain with aspiration or drain placement.  Orthopedic Surgeon will discuss case with orthopedic surgeon Dr. Allena Katz on 02/17(he performed the screw exchange)   GERD - Continue outpatient pepcid  Hypothyroidism - Resume outpatient synthroid  Type II diabetes mellitus  - CBG's ac/hs - SSI  - Target CBG 140 to 180 - Follow hyper/hypoglycemic protocol   Best Practice (right click and "Reselect all SmartList Selections" daily)   Diet/type: Regular consistency (see orders) DVT prophylaxis  SCD Pressure ulcer(s): N/A GI prophylaxis: H2B Lines: Right Upper Extremity PICC Line and still needed  Foley:  N/A Code Status:  full code Last date of multidisciplinary goals of care discussion [10/29/23]  02/16: Updated pt at bedside and pts son Raygan Skarda via telephone regarding pts condition and current plan of care.  All questions were answered  Labs   CBC: Recent Labs  Lab 10/28/23 0033 10/28/23 0452 10/28/23 1123 10/28/23 1651 10/28/23 2201 10/29/23 0428  WBC 4.8  --   --   --   --   --   NEUTROABS 3.0  --   --   --   --   --   HGB 9.7* 8.9* 9.8* 11.0* 9.2* 9.1*  HCT 29.6* 27.5* 30.9* 34.6* 28.5* 28.5*  MCV 98.0  --   --   --   --   --   PLT 211  --   --   --   --   --     Basic Metabolic Panel: Recent Labs  Lab 10/28/23 0307 10/28/23 1123 10/29/23 0428  NA 141  --  138  K 3.9  --  4.8  CL 106  --  105  CO2 25  --  23  GLUCOSE 81  --  212*  BUN  30*  --  36*  CREATININE 1.35*  --  1.44*  CALCIUM 8.5*  --  7.4*  MG 1.6* 2.2 1.9  PHOS 3.3  --  3.5   GFR: Estimated Creatinine Clearance: 28.2 mL/min (A) (by C-G formula based on SCr of 1.44 mg/dL (H)). Recent Labs  Lab 10/28/23 0033  WBC 4.8  LATICACIDVEN 1.2    Liver Function Tests: Recent Labs  Lab 10/28/23 0307  AST 44*  ALT 23  ALKPHOS 135*  BILITOT 1.1  PROT 6.2*  ALBUMIN 3.3*   No results for input(s): "LIPASE", "AMYLASE" in the last 168 hours. No results for input(s): "AMMONIA" in the last 168 hours.  ABG    Component Value Date/Time   PHART 7.35 10/29/2023 0500   PCO2ART 45 10/29/2023 0500   PO2ART 98 10/29/2023 0500   HCO3 24.8 10/29/2023 0500   TCO2 24 07/15/2020 0921   ACIDBASEDEF 1.1 10/29/2023 0500   O2SAT 99.2 10/29/2023 0500     Coagulation Profile: Recent Labs  Lab 10/28/23 1123  INR 1.1    Cardiac Enzymes: Recent Labs  Lab 10/28/23 1123  CKTOTAL 89    HbA1C: Hemoglobin A1C  Date/Time Value Ref Range Status  09/26/2012 12:00 PM 7.5 (H) 4.2 - 6.3 %  Final    Comment:    The American Diabetes Association recommends that a primary goal of therapy should be <7% and that physicians should reevaluate the treatment regimen in patients with HbA1c values consistently >8%.    Hgb A1c MFr Bld  Date/Time Value Ref Range Status  10/10/2023 12:39 PM 9.2 (H) 4.8 - 5.6 % Final    Comment:    (NOTE) Pre diabetes:          5.7%-6.4%  Diabetes:              >6.4%  Glycemic control for   <7.0% adults with diabetes   01/21/2023 04:20 AM 6.8 (H) 4.8 - 5.6 % Final    Comment:    (NOTE) Pre diabetes:          5.7%-6.4%  Diabetes:              >6.4%  Glycemic control for   <7.0% adults with diabetes     CBG: Recent Labs  Lab 10/28/23 0443 10/28/23 0520 10/28/23 0618 10/28/23 1643 10/28/23 2145  GLUCAP 58* 79 110* 259* 185*    Review of Systems: Positives in BOLD   Gen: Denies fever, chills, weight change, fatigue, night sweats HEENT: Denies blurred vision, double vision, hearing loss, tinnitus, sinus congestion, rhinorrhea, sore throat, neck stiffness, dysphagia PULM: Denies shortness of breath, cough, sputum production, hemoptysis, wheezing CV: Denies chest pain, edema, orthopnea, paroxysmal nocturnal dyspnea, palpitations GI: abdominal pain, nausea, vomiting, diarrhea, hematochezia, melena, constipation, change in bowel habits GU: Denies dysuria, hematuria, polyuria, oliguria, urethral discharge Endocrine: Denies hot or cold intolerance, polyuria, polyphagia or appetite change Musculo: Right hip pain Derm: Denies rash, dry skin, scaling or peeling skin change Heme: Denies easy bruising, bleeding, bleeding gums Neuro: Denies headache, numbness, weakness, slurred speech, loss of memory or consciousness  Past Medical History:  She,  has a past medical history of Acute respiratory failure (HCC) (04/09/2018), Anemia, Anxiety, Aortic atherosclerosis (HCC), Bowel obstruction (HCC), Carotid artery disease (HCC), Cerebral microvascular  disease, Chronic heart failure with preserved ejection fraction (HFpEF) (HCC), CKD (chronic kidney disease), stage IV (HCC), Closed fracture of one rib of right side (05/29/2020), Colon polyp, Complete tear of left rotator cuff (10/16/2014), Coronary artery  disease (non-obstructive) (08/25/2016), DDD (degenerative disc disease), cervical, Depression, Diabetic neuropathy (HCC), Diarrhea (03/04/2022), Displaced intertrochanteric fracture of right femur (HCC), Fatty liver, Gastritis without bleeding, GERD (gastroesophageal reflux disease), GI bleed, Gout, Hypertension, Hypothyroidism, Long term current use of amiodarone, Low back pain, Morbid obesity (HCC), Multiple gastric polyps, 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), On apixaban therapy, Osteoarthritis, Osteoporosis, PAF (paroxysmal atrial fibrillation) (HCC) (2016), Presence of permanent cardiac pacemaker (07/01/2015), Pulmonary hypertension (HCC), Sinus node dysfunction (HCC), T2DM (type 2 diabetes mellitus) (HCC), Tendinitis of upper biceps tendon of left shoulder (11/10/2019), TIA (transient ischemic attack), Type 2 diabetes mellitus with neurological complications (HCC) (05/02/2018), Uncontrolled type 2 diabetes mellitus with hypoglycemia, with long-term current use of insulin (HCC) (03/04/2022), Urinary incontinence (10/17/2022), and Vascular dementia, moderate, with anxiety (HCC).   Surgical History:   Past Surgical History:  Procedure Laterality Date   ABDOMINAL HYSTERECTOMY  1980   APPENDECTOMY     BACK SURGERY     2008. plate & screws    BACK SURGERY  2019   patient describes kyphoplasty for compression fractures, MD referral said fusion   BREAST BIOPSY Right 04/2015   stereo fibroadenomatous change, neg for atypia   BREAST EXCISIONAL BIOPSY Left    neg   CARDIAC CATHETERIZATION Left 08/25/2016   Procedure: Left Heart Cath and Coronary Angiography;  Surgeon: Lamar Blinks, MD;  Location: [facility] INVASIVE CV LAB;  Service: Cardiovascular;   Laterality: Left;   CARDIOVERSION N/A 06/28/2017   Procedure: CARDIOVERSION;  Surgeon: Lamar Blinks, MD;  Location: [facility] ORS;  Service: Cardiovascular;  Laterality: N/A;   CARDIOVERSION N/A 02/06/2018   Procedure: CARDIOVERSION;  Surgeon: Wendall Stade, MD;  Location: [facility] ENDOSCOPY;  Service: Cardiovascular;  Laterality: N/A;   CARDIOVERSION N/A 07/15/2020   Procedure: CARDIOVERSION;  Surgeon: Jake Bathe, MD;  Location: [facility] ENDOSCOPY;  Service: Cardiovascular;  Laterality: N/A;   CATARACT EXTRACTION, BILATERAL  2012   CHOLECYSTECTOMY     colon blockage  1999   COLON SURGERY  2000   removed 20% of colon. colon had collapsed   COLONOSCOPY  2016   polyps removed 2016   DORSAL COMPARTMENT RELEASE Left 09/14/2016   Procedure: RELEASE DORSAL COMPARTMENT (DEQUERVAIN);  Surgeon: Donato Heinz, MD;  Location: [facility] ORS;  Service: Orthopedics;  Laterality: Left;   ESOPHAGOGASTRODUODENOSCOPY N/A 01/27/2015   Procedure: ESOPHAGOGASTRODUODENOSCOPY (EGD);  Surgeon: Christena Deem, MD;  Location: [facility] ENDOSCOPY;  Service: Endoscopy;  Laterality: N/A;   ESOPHAGOGASTRODUODENOSCOPY (EGD) WITH PROPOFOL N/A 07/24/2015   Procedure: ESOPHAGOGASTRODUODENOSCOPY (EGD) WITH PROPOFOL;  Surgeon: Midge Minium, MD;  Location: [facility] ENDOSCOPY;  Service: Endoscopy;  Laterality: N/A;   ESOPHAGOGASTRODUODENOSCOPY (EGD) WITH PROPOFOL N/A 04/12/2018   Procedure: ESOPHAGOGASTRODUODENOSCOPY (EGD) WITH PROPOFOL;  Surgeon: Wyline Mood, MD;  Location: [facility] ENDOSCOPY;  Service: Gastroenterology;  Laterality: N/A;   ESOPHAGOGASTRODUODENOSCOPY (EGD) WITH PROPOFOL N/A 06/22/2018   Procedure: ESOPHAGOGASTRODUODENOSCOPY (EGD) WITH PROPOFOL;  Surgeon: Rachael Fee, MD;  Location: [facility] ENDOSCOPY;  Service: Endoscopy;  Laterality: N/A;   ESOPHAGOGASTRODUODENOSCOPY (EGD) WITH PROPOFOL N/A 07/09/2018   Procedure: ESOPHAGOGASTRODUODENOSCOPY (EGD) WITH PROPOFOL with resection of gastric polyps;  Surgeon: Wyline Mood, MD;  Location:  [facility] ENDOSCOPY;  Service: Gastroenterology;  Laterality: N/A;   ESOPHAGOGASTRODUODENOSCOPY (EGD) WITH PROPOFOL N/A 05/17/2019   Procedure: ESOPHAGOGASTRODUODENOSCOPY (EGD) WITH PROPOFOL;  Surgeon: Wyline Mood, MD;  Location: [facility] ENDOSCOPY;  Service: Gastroenterology;  Laterality: N/A;   EUS N/A 06/22/2018   Procedure: UPPER ENDOSCOPIC ULTRASOUND (EUS) RADIAL;  Surgeon: Rachael Fee, MD;  Location: [facility] ENDOSCOPY;  Service: Endoscopy;  Laterality: N/A;   EYE SURGERY Bilateral    cataract extractions   HARDWARE REMOVAL Right 10/17/2023   Procedure: Right hip removal of nail hardware with screw exchange;  Surgeon: Signa Kell, MD;  Location: [facility] ORS;  Service: Orthopedics;  Laterality: Right;   HUMERUS IM NAIL Left 06/02/2022   Procedure: INTRAMEDULLARY (IM) NAIL HUMERAL;  Surgeon: Christena Flake, MD;  Location: [facility] ORS;  Service: Orthopedics;  Laterality: Left;   INTRAMEDULLARY (IM) NAIL INTERTROCHANTERIC Right 01/21/2023   Procedure: INTRAMEDULLARY (IM) NAIL INTERTROCHANTERIC;  Surgeon: Signa Kell, MD;  Location: [facility] ORS;  Service: Orthopedics;  Laterality: Right;   JOINT REPLACEMENT Bilateral 2014   Bilateral Knee replacement   LEAD REVISION/REPAIR N/A 08/29/2017   Procedure: LEAD REVISION/REPAIR;  Surgeon: Regan Lemming, MD;  Location: [facility] INVASIVE CV LAB;  Service: Cardiovascular;  Laterality: N/A;   OOPHORECTOMY     PACEMAKER INSERTION Left 07/01/2015   Procedure: INSERTION PACEMAKER;  Surgeon: Marcina Millard, MD;  Location: [facility] ORS;  Service: Cardiovascular;  Laterality: Left;   PACEMAKER REVISION N/A 08/28/2017   Procedure: PACEMAKER REVISION;  Surgeon: Duke Salvia, MD;  Location: [facility] INVASIVE CV LAB;  Service: Cardiovascular;  Laterality: N/A;   REPLACEMENT TOTAL KNEE BILATERAL     SHOULDER ARTHROSCOPY WITH ROTATOR CUFF REPAIR AND OPEN BICEPS TENODESIS Left 11/21/2019   Procedure: LEFT SHOULDER ARTHROSCOPY WITH DEBRIDEMENT, DECOMPRESSION, ROTATOR CUFF REPAIR AND  BICEPS TENOLYSIS;  Surgeon: Christena Flake, MD;  Location: [facility] ORS;  Service: Orthopedics;  Laterality: Left;   SHOULDER ARTHROSCOPY WITH SUBACROMIAL DECOMPRESSION Left 2013   TEE WITHOUT CARDIOVERSION N/A 06/28/2017   Procedure: TRANSESOPHAGEAL ECHOCARDIOGRAM (TEE);  Surgeon: Lamar Blinks, MD;  Location: [facility] ORS;  Service: Cardiovascular;  Laterality: N/A;   TUBAL LIGATION       Social History:   reports that she has never smoked. She has never used smokeless tobacco. She reports that she does not drink alcohol and does not use drugs.   Family History:  Her family history includes CAD in her mother; Cancer in her father; Diabetes Mellitus II in her brother and mother; Heart attack in her mother; Stroke in her brother. There is no history of Breast cancer.   Allergies Allergies  Allergen Reactions   Fish Allergy Shortness Of Breath    All kinds of fish. Severe vomitting even with smell of fish.    Macrolides And Ketolides Other (See Comments)    Severe stomach pain (mycins)   Meperidine Shortness Of Breath, Nausea Only and Other (See Comments)    Stomach pain    Other Nausea Only, Swelling, Rash, Anaphylaxis, Diarrhea, Shortness Of Breath and Other (See Comments)    Allergen: NON-STEROIDS, bextra - hands and feet swell  Reaction:  Stomach pain   Other Reaction: Intolerance   Prednisone Anaphylaxis and Other (See Comments)    (facial swelling/redness/burning)Increases pts blood sugar  Pt states that she is allergic to all steroids.     Shellfish Allergy Anaphylaxis, Shortness Of Breath, Diarrhea, Nausea Only and Rash    Stomach pain    Sulfa Antibiotics Shortness Of Breath, Diarrhea, Nausea Only and Other (See Comments)    Reaction:  Stomach pain     Sulfacetamide Sodium Diarrhea, Nausea Only, Other (See Comments) and Shortness Of Breath    Reaction:  Stomach pain    Telbivudine Other (See Comments)    Unknown reaction   Uloric [Febuxostat] Anaphylaxis    Locks pt's body  up    Aspirin Rash  and Other (See Comments)    Stomach pain (tolerates lower doses)   Celecoxib Other (See Comments)    GI bleed, weakness, and stomach pain.     Cephalexin Diarrhea, Nausea Only and Other (See Comments)    stomach pain    Codeine Diarrhea, Nausea Only and Other (See Comments)    Reaction:  Stomach pain Pt tolerates morphine    Dilaudid [Hydromorphone Hcl] Other (See Comments)    Reactions: easy to overdose - blood pressure drops really low   Erythromycin Diarrhea, Nausea Only and Other (See Comments)    Reaction:  Stomach pain    Iodinated Contrast Media Rash and Other (See Comments)    Pt states that she is unable to have because she has chronic kidney disease.     Lipitor [Atorvastatin Calcium] Nausea Only    Reaction: nausea, weakness, pass blood   Oxycodone Other (See Comments)    Reaction:  Stomach pain    Aleve [Naproxen]     Reaction: severe stomach pain   Atorvastatin Nausea Only and Other (See Comments)    Weakness    Cephalosporins Diarrhea, Nausea Only and Other (See Comments)    Other: abdominal pain  Tolerated 1st generation cephalosporin (CEFAZOLIN) on 06/02/2022 and 01/21/2023 without documented ADRs.    Doxycycline     Stomach pain    Iodine Rash   Motrin [Ibuprofen]     Reaction: severe stomach pain   Tape Other (See Comments)    Causes pts skin to tear  Pt states that she is able to use paper tape.       Valdecoxib Swelling and Other (See Comments)    Pt states that her hands and feet swell.       Home Medications  Prior to Admission medications   Medication Sig Start Date End Date Taking? Authorizing Provider  acetaminophen (TYLENOL) 325 MG tablet Take 2 tablets (650 mg total) by mouth every 6 (six) hours as needed for mild pain, moderate pain or fever (or Fever >/= 101). 02/01/23   Wieting, Richard, MD  acetaminophen (TYLENOL) 500 MG tablet Take 2 tablets (1,000 mg total) by mouth every 8 (eight) hours. 10/17/23 10/16/24  Signa Kell, MD   allopurinol (ZYLOPRIM) 100 MG tablet Take 100 mg by mouth in the morning.    [provider]  alum & mag hydroxide-simeth (MAALOX/MYLANTA) 200-200-20 MG/5ML suspension Take 10 mLs by mouth 3 (three) times daily as needed for indigestion or heartburn.    [provider]  amiodarone (PACERONE) 200 MG tablet Take 0.5 tablets (100 mg total) by mouth daily. 07/11/23   Sherie Don, NP  apixaban (ELIQUIS) 2.5 MG TABS tablet Take 1 tablet (2.5 mg total) by mouth 2 (two) times daily. 03/06/22   Sunnie Nielsen, DO  carboxymethylcellulose (REFRESH PLUS) 0.5 % SOLN 1 drop 3 (three) times daily as needed.    [provider]  Cholecalciferol (VITAMIN D) 50 MCG (2000 UT) CAPS Take 2,000 Units by mouth in the morning.    [provider]  docusate sodium (COLACE) 100 MG capsule Take 100 mg by mouth 2 (two) times daily.    [provider]  estradiol (ESTRACE) 0.1 MG/GM vaginal cream Place 1 Applicatorful vaginally every Tuesday, Thursday, and Saturday at 6 PM.    [provider]  famotidine (PEPCID) 10 MG tablet Take 10 mg by mouth in the morning.    [provider]  Fluticasone Furoate (FLONASE SENSIMIST NA) Place 1 spray into the nose in the  morning.    [provider]  folic acid (FOLVITE) 1 MG tablet Take 1 tablet (1 mg total) by mouth daily. 02/02/23   Alford Highland, MD  furosemide (LASIX) 20 MG tablet Take 1 tablet (20 mg total) by mouth daily. 02/02/23   Alford Highland, MD  gabapentin (NEURONTIN) 300 MG capsule Take 600 mg by mouth 3 (three) times daily. (0800, 1400 & 2000)    [provider]  ibandronate (BONIVA) 150 MG tablet Take 150 mg by mouth every 30 (thirty) days. Take in the morning with a full glass of water, on an empty stomach, and do not take anything else by mouth or lie down for the next 30 min.    [provider]  Insulin Regular Human (NOVOLIN R FLEXPEN RELION) 100 UNIT/ML KwikPen Inject 0-10  Units into the skin as directed. Check blood sugar 3 times daily and inject insulin per sliding scale: Less than 70=0 units and eat a snack recheck in 15 minutes; 100-120=2 units 121-150=3 units 151-200=4 units 201-250=5 units 251-300=6 units 301-350=7 units 351-400=8 units 401-450=9 units 451-500=10 units; call MD for reading greater than 500.    [provider]  LANTUS 100 UNIT/ML injection Inject 0.09 mLs (9 Units total) into the skin at bedtime. Patient taking differently: Inject 18 Units into the skin at bedtime. 02/01/23   Alford Highland, MD  levothyroxine (SYNTHROID) 75 MCG tablet Take 37.5 mcg by mouth daily before breakfast. (0600)    [provider]  magnesium hydroxide (MILK OF MAGNESIA) 400 MG/5ML suspension Take 30 mLs by mouth daily as needed (constipation).    [provider]  midodrine (PROAMATINE) 5 MG tablet Take 5 mg by mouth 3 (three) times daily with meals. (0730, 1130 & 1600) DO NOT LIE DOWN AFTER TAKING, DO NOT TAKE AFTER EVENING MEAL. TAKE LAST DOSE OF THE DAY AT LEAST 4 HOURS BEFORE BEDTIME    [provider]  Multiple Vitamin (MULTIVITAMIN WITH MINERALS) TABS tablet Take 1 tablet by mouth daily. 02/02/23   Alford Highland, MD  nystatin cream (MYCOSTATIN) Apply 1 Application topically 2 (two) times daily as needed (rash).    [provider]  nystatin powder Apply 1 Application topically in the morning and at bedtime. (0800 & 2000) Apply topically to groin    [provider]  ondansetron (ZOFRAN-ODT) 4 MG disintegrating tablet Take 1 tablet (4 mg total) by mouth every 8 (eight) hours as needed for nausea or vomiting. 10/17/23   Signa Kell, MD  Polyethyl Glycol-Propyl Glycol 0.4-0.3 % SOLN Place 1 drop into both eyes See admin instructions. Instill 1 drop into both eye scheduled twice daily (0800 & 2000) & may instill 1 drop into both eyes twice daily if needed for itching eyes.    [provider]   polyethylene glycol (MIRALAX / GLYCOLAX) 17 g packet Take 17 g by mouth daily. 02/01/23   Alford Highland, MD  rOPINIRole (REQUIP) 0.5 MG tablet Take 0.5 mg by mouth at bedtime. (2000)    [provider]  senna (SENOKOT) 8.6 MG tablet Take 1 tablet by mouth at bedtime.    [provider]  sertraline (ZOLOFT) 100 MG tablet Take 100 mg by mouth in the morning. (0800) 100 mg + 25 mg=125 mg    [provider]  sertraline (ZOLOFT) 25 MG tablet Take 25 mg by mouth in the morning. (0800) 25 mg + 100 mg=125 mg    [provider]  solifenacin (VESICARE) 5 MG tablet Take 5  mg by mouth in the morning. (0800)    [provider]  thiamine (VITAMIN B-1) 100 MG tablet Take 1 tablet (100 mg total) by mouth daily. 02/02/23   Alford Highland, MD  traMADol (ULTRAM) 50 MG tablet Take 1 tablet (50 mg total) by mouth every 6 (six) hours as needed for moderate pain. Patient taking differently: Take 50 mg by mouth 2 (two) times daily as needed (pain.). 01/26/23   Dedra Skeens, PA-C  traMADol (ULTRAM) 50 MG tablet Take 1 tablet (50 mg total) by mouth every 4 (four) hours as needed for severe pain (pain score 7-10) or moderate pain (pain score 4-6). 10/17/23 10/16/24  Signa Kell, MD  vitamin B-12 (CYANOCOBALAMIN) 1000 MCG tablet Take 1,000 mcg by mouth in the morning.    [provider]     Critical care time: 45 minutes      Zada Girt, AGNP  Pulmonary/Critical Care Pager [phone] (please enter 7 digits) PCCM Consult Pager [phone] (please enter 7 digits)

## 2023-10-29 NOTE — Progress Notes (Signed)
PFA test picked up at 1717 by courier to transport to [facility] for testing.

## 2023-10-30 ENCOUNTER — Encounter: Payer: Self-pay | Admitting: Anesthesiology

## 2023-10-30 ENCOUNTER — Encounter
Admission: EM | Disposition: A | Discharge status: Skilled Nursing Facility | Payer: Self-pay | Source: Skilled Nursing Facility | Attending: Internal Medicine

## 2023-10-30 DIAGNOSIS — N179 Acute kidney failure, unspecified: Secondary | ICD-10-CM | POA: Diagnosis not present

## 2023-10-30 DIAGNOSIS — J9601 Acute respiratory failure with hypoxia: Secondary | ICD-10-CM | POA: Diagnosis not present

## 2023-10-30 DIAGNOSIS — K219 Gastro-esophageal reflux disease without esophagitis: Secondary | ICD-10-CM | POA: Diagnosis not present

## 2023-10-30 DIAGNOSIS — I5020 Unspecified systolic (congestive) heart failure: Secondary | ICD-10-CM | POA: Diagnosis not present

## 2023-10-30 LAB — RENAL FUNCTION PANEL
Albumin: 2.9 g/dL — ABNORMAL LOW (ref 3.5–5.0)
Anion gap: 10 (ref 5–15)
BUN: 36 mg/dL — ABNORMAL HIGH (ref 8–23)
CO2: 23 mmol/L (ref 22–32)
Calcium: 8 mg/dL — ABNORMAL LOW (ref 8.9–10.3)
Chloride: 100 mmol/L (ref 98–111)
Creatinine, Ser: 1.3 mg/dL — ABNORMAL HIGH (ref 0.44–1.00)
GFR, Estimated: 41 mL/min — ABNORMAL LOW (ref >60)
Glucose, Bld: 361 mg/dL — ABNORMAL HIGH (ref 70–99)
Phosphorus: 3.1 mg/dL (ref 2.5–4.6)
Potassium: 5 mmol/L (ref 3.5–5.1)
Sodium: 133 mmol/L — ABNORMAL LOW (ref 135–145)

## 2023-10-30 LAB — CBC WITH DIFFERENTIAL/PLATELET
Abs Immature Granulocytes: 0.02 10*3/uL (ref 0.00–0.07)
Basophils Absolute: 0 10*3/uL (ref 0.0–0.1)
Basophils Relative: 0 %
Eosinophils Absolute: 0 10*3/uL (ref 0.0–0.5)
Eosinophils Relative: 0 %
HCT: 28 % — ABNORMAL LOW (ref 36.0–46.0)
Hemoglobin: 8.8 g/dL — ABNORMAL LOW (ref 12.0–15.0)
Immature Granulocytes: 0 %
Lymphocytes Relative: 18 %
Lymphs Abs: 1.4 10*3/uL (ref 0.7–4.0)
MCH: 32.4 pg (ref 26.0–34.0)
MCHC: 31.4 g/dL (ref 30.0–36.0)
MCV: 102.9 fL — ABNORMAL HIGH (ref 80.0–100.0)
Monocytes Absolute: 0.5 10*3/uL (ref 0.1–1.0)
Monocytes Relative: 7 %
Neutro Abs: 5.6 10*3/uL (ref 1.7–7.7)
Neutrophils Relative %: 75 %
Platelets: 164 10*3/uL (ref 150–400)
RBC: 2.72 MIL/uL — ABNORMAL LOW (ref 3.87–5.11)
RDW: 14.4 % (ref 11.5–15.5)
WBC: 7.5 10*3/uL (ref 4.0–10.5)
nRBC: 0 % (ref 0.0–0.2)

## 2023-10-30 LAB — GLUCOSE, CAPILLARY
Glucose-Capillary: 227 mg/dL — ABNORMAL HIGH (ref 70–99)
Glucose-Capillary: 260 mg/dL — ABNORMAL HIGH (ref 70–99)
Glucose-Capillary: 218 mg/dL — ABNORMAL HIGH (ref 70–99)
Glucose-Capillary: 192 mg/dL — ABNORMAL HIGH (ref 70–99)

## 2023-10-30 LAB — PROTIME-INR
INR: 1.2 (ref 0.8–1.2)
Prothrombin Time: 15 s (ref 11.4–15.2)

## 2023-10-30 LAB — APTT: aPTT: 41 s — ABNORMAL HIGH (ref 24–36)

## 2023-10-30 LAB — MAGNESIUM: Magnesium: 2 mg/dL (ref 1.7–2.4)

## 2023-10-30 SURGERY — IRRIGATION AND DEBRIDEMENT HIP
Anesthesia: Choice | Site: Hip | Laterality: Right

## 2023-10-30 MED ORDER — GLUCERNA SHAKE PO LIQD
237.0000 mL | Freq: Three times a day (TID) | ORAL | Status: DC
  Administered 2023-10-30 – 2023-11-05 (×8): 237 mL via ORAL

## 2023-10-30 MED ORDER — INSULIN GLARGINE-YFGN 100 UNIT/ML ~~LOC~~ SOLN
10.0000 [IU] | Freq: Every day | SUBCUTANEOUS | Status: DC
  Administered 2023-10-30 – 2023-11-01 (×3): 10 [IU] via SUBCUTANEOUS
  Filled 2023-10-30 (×3): qty 0.1

## 2023-10-30 MED ORDER — MIDODRINE HCL 5 MG PO TABS
10.0000 mg | ORAL_TABLET | Freq: Three times a day (TID) | ORAL | Status: DC
  Administered 2023-10-30 – 2023-11-01 (×7): 10 mg via ORAL
  Filled 2023-10-30 (×7): qty 2

## 2023-10-30 MED ORDER — LACTATED RINGERS IV BOLUS
500.0000 mL | Freq: Once | INTRAVENOUS | Status: AC
  Administered 2023-10-30: 500 mL via INTRAVENOUS

## 2023-10-30 NOTE — Plan of Care (Signed)
  Problem: Fluid Volume: Goal: Ability to maintain a balanced intake and output will improve Outcome: Progressing   Problem: Clinical Measurements: Goal: Will remain free from infection Outcome: Progressing Goal: Diagnostic test results will improve Outcome: Progressing Goal: Respiratory complications will improve Outcome: Progressing Goal: Cardiovascular complication will be avoided Outcome: Progressing   Problem: Safety: Goal: Ability to remain free from injury will improve Outcome: Progressing

## 2023-10-30 NOTE — Consult Note (Addendum)
PHARMACY CONSULT NOTE - ELECTROLYTES  Pharmacy Consult for Electrolyte Monitoring and Replacement   Recent Labs: Height: 5\' 3"  (160 cm) Weight: 72.8 kg (160 lb 7.9 oz) IBW/kg (Calculated) : 52.4Estimated Creatinine Clearance: 31.4 mL/min (A) (by C-G formula based on SCr of 1.3 mg/dL (H)).  Potassium (mmol/L)  Date Value  10/30/2023 5.0  10/16/2014 4.6   Magnesium (mg/dL)  Date Value  21/30/8657 2.0   Calcium (mg/dL)  Date Value  84/69/6295 8.0 (L)   Calcium, Total (mg/dL)  Date Value  28/41/3244 8.5   Albumin (g/dL)  Date Value  09/14/7251 2.9 (L)  07/03/2023 4.4  10/29/2012 3.9   Phosphorus (mg/dL)  Date Value  66/44/0347 3.1   Sodium (mmol/L)  Date Value  10/30/2023 133 (L)  07/03/2023 137  10/16/2014 141   Assessment  Deborah Obrien is a 83 y.o. female presenting with hypoglycemia. PMH significant for CAD, HFpEF, atrial fibrillation, SA node dysfunction/SSS (s/p PPM placement). BILATERAL carotid artery disease, TIA, chronic cerebral microvascular disease, aortic atherosclerosis, HTN, HLD, T2DM, hypothyroidism, CKD-IV, pulmonary hypertension, GERD (on daily H2 blocker), gastritis, GI bleeding,  anemia, OA, cervical DDD (s/p ACDF C6-C7), chronic back pain, peripheral neuropathy, anxiety, depression. Pharmacy has been consulted to monitor and replace electrolytes.  Diet: NPO MIVF: None Pertinent medications: None  Goal of Therapy: Electrolytes WNL K = 5.0 Mg = 2.0 Phos = 3.1  *Keep K > 4.0 and Mg > 2.0 due to history of heart disease/afib   Plan:  No replacement needed Continue to monitor K up-trending  Continue to monitor with AM labs   Thank you for allowing pharmacy to be a part of this patient's care.  Effie Shy, PharmD Pharmacy Resident  10/30/2023 6:17 AM

## 2023-10-30 NOTE — Plan of Care (Signed)
  Problem: Education: Goal: Ability to describe self-care measures that may prevent or decrease complications (Diabetes Survival Skills Education) will improve Outcome: Progressing   Problem: Coping: Goal: Ability to adjust to condition or change in health will improve Outcome: Progressing   Problem: Fluid Volume: Goal: Ability to maintain a balanced intake and output will improve Outcome: Progressing   Problem: Health Behavior/Discharge Planning: Goal: Ability to identify and utilize available resources and services will improve Outcome: Progressing Goal: Ability to manage health-related needs will improve Outcome: Progressing   Problem: Nutritional: Goal: Maintenance of adequate nutrition will improve Outcome: Progressing   Problem: Skin Integrity: Goal: Risk for impaired skin integrity will decrease Outcome: Progressing   Problem: Tissue Perfusion: Goal: Adequacy of tissue perfusion will improve Outcome: Progressing   Problem: Education: Goal: Knowledge of General Education information will improve Description: Including pain rating scale, medication(s)/side effects and non-pharmacologic comfort measures Outcome: Progressing   Problem: Health Behavior/Discharge Planning: Goal: Ability to manage health-related needs will improve Outcome: Progressing   Problem: Clinical Measurements: Goal: Ability to maintain clinical measurements within normal limits will improve Outcome: Progressing Goal: Will remain free from infection Outcome: Progressing Goal: Diagnostic test results will improve Outcome: Progressing Goal: Respiratory complications will improve Outcome: Progressing Goal: Cardiovascular complication will be avoided Outcome: Progressing   Problem: Coping: Goal: Level of anxiety will decrease Outcome: Progressing   Problem: Elimination: Goal: Will not experience complications related to urinary retention Outcome: Progressing   Problem: Pain  Managment: Goal: General experience of comfort will improve and/or be controlled Outcome: Progressing   Problem: Safety: Goal: Ability to remain free from injury will improve Outcome: Progressing   Problem: Skin Integrity: Goal: Risk for impaired skin integrity will decrease Outcome: Progressing

## 2023-10-30 NOTE — Consult Note (Signed)
ORTHOPAEDIC CONSULTATION  REQUESTING PHYSICIAN: Erin Fulling, MD  Chief Complaint:   R hip pain  History of Present Illness: Patient was evaluated by my colleague, Dr. Joice Lofts, on 10/28/2023.  From his consult note: "Deborah Obrien is a 83 y.o. female with multiple medical problems including coronary artery disease, hypertension, hypothyroidism, gastroesophageal reflux disease, stage IV kidney disease, peripheral vascular disease/carotid artery disease, gout, gastroesophageal reflux disease, fatty liver, diabetes, osteoporosis, and paroxysmal atrial fibrillation for which she is on blood thinners.  The patient is now 9 months status post an intramedullary nailing of a displaced intertrochanteric fracture of her right hip by Dr. Allena Katz.  The fracture went on to heal but had some compression/collapse across the fracture site in the process of healing, resulting in some prominence of the 2 more proximal leg screws.  These screws were quite symptomatic so the patient underwent a screw exchange on 10/17/2023, replacing the prominent screws with shorter lag screws.     The patient's postop course has been complicated by bleeding from the wound site.  She returned to the emergency room on the first postoperative day complaining of bloody drainage from her wound.  A CT scan at this time demonstrated a large hematoma around the lateral aspect of her hip in the area of the surgical incision.  After consultation with orthopedics, the decision was made to apply a compressive dressing but to keep the blood thinners going to minimize the risk of a postoperative DVT.  The patient notes that she was doing reasonably well until yesterday afternoon when she developed increased pain in her hip while ambulating with her walker prompting her to return to the emergency room yesterday evening where a repeat CT scan confirmed the presence of an enlarging hematoma but  did not demonstrate any evidence of fractures or other acute bony processes.  The patient has been admitted for pain control and further workup.  Orthopedics has been consulted for assistance with management of the postoperative hematoma."  Today, the patient states that she did have increasing pain as well as some bloody drainage from the incisions, which is what brought her to the emergency room.  Her mobility was somewhat decreased due to this, but her pain is tolerable.  She has been diagnosed with RSV and is feeling symptomatic from this currently as well.  Since admission, she was transferred to the ICU for severe abdominal pain, hypotension, and reversal of anticoagulation.  Workup of abdominal pain was negative with CT scan.  She is being appropriately resuscitated from a blood pressure standpoint.  Her blood counts have also remained stable for the last few days.  Pain in the lateral hip is also relatively unchanged.  Past Medical History:  Diagnosis Date   Acute respiratory failure (HCC) 04/09/2018   Anemia    Anxiety    Aortic atherosclerosis (HCC)    Bowel obstruction (HCC)    Carotid artery disease (HCC)    Cerebral microvascular disease    Chronic heart failure with preserved ejection fraction (HFpEF) (HCC)    a.) TTE 04/10/2018: EF 60-65%, mildly dil LA/RA, mod dil RV, mod TR, PASP ; b.) TTE 01/22/2023: EF 60-65%, no rwma, low-nl RV fxn, RVSP 51.2 mmHg. Mild MR. Mod-sev TR.   CKD (chronic kidney disease), stage IV (HCC)    Closed fracture of one rib of right side 05/29/2020   Colon polyp    Complete tear of left rotator cuff 10/16/2014   Coronary artery disease (non-obstructive) 08/25/2016   a.) LHC  08/25/2016:  25% oLM-LM, 20% mLAD, 15% o-pLCx - med mgmt; b.) MV 10/20/2017: no isch/infarct, EF 55-65%   DDD (degenerative disc disease), cervical    a.) s/p ACDF C6-C7   Depression    Diabetic neuropathy (HCC)    Diarrhea 03/04/2022   Displaced intertrochanteric fracture  of right femur (HCC)    Fatty liver    Gastritis without bleeding    GERD (gastroesophageal reflux disease)    GI bleed    Gout    Hypertension    Hypothyroidism    Long term current use of amiodarone    Low back pain    history of kyphoplasty t12-l1   Morbid obesity (HCC)    Multiple gastric polyps    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   a.) presurgical PCR (+) 10/10/2023 prior to REMOVAL OF NAIL HARDWARE WITH SCREW EXCHANGE (RIGHT HIP)   On apixaban therapy    Osteoarthritis    Osteoporosis    PAF (paroxysmal atrial fibrillation) (HCC) 2016   a.) CHA2DS2-VASc = 9 (age x 2, sex, HFpEF, HTN, TIA x 2, vascular disease, T2DM) as of 10/13/2023; b.) s/p DCCV 06/28/2017 (120 J x1), 02/06/2018 (150c J x 1), 07/15/2020 (75 J x ); c.) ardiac rate/rhythm maintained on oral amiodarone; chronically anticoagulated using apixaban   Presence of permanent cardiac pacemaker 07/01/2015   a.) PPM placed 07/01/2015; b.) device upgraded to dual chamber MDT Azure XT DR MRI SureScan (SN: [id number]) 08/29/2017   Pulmonary hypertension (HCC)    a.) TTE 04/10/2018: PASP 50 mmHg; b.) TTE 01/22/2023: RVSP 51.2 mmHg   Sinus node dysfunction (HCC)    a.) s/p PPM placement 06/2015 --> device upgraded 08/2017   T2DM (type 2 diabetes mellitus) (HCC)    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   Uncontrolled type 2 diabetes mellitus with hypoglycemia, with long-term current use of insulin (HCC) 03/04/2022   Urinary incontinence 10/17/2022   Vascular dementia, moderate, with anxiety (HCC)    Past Surgical History:  Procedure Laterality Date   ABDOMINAL HYSTERECTOMY  1980   APPENDECTOMY     BACK SURGERY     2008. plate & screws    BACK SURGERY  2019   patient describes kyphoplasty for compression fractures, MD referral said fusion   BREAST BIOPSY Right 04/2015   stereo  fibroadenomatous change, neg for atypia   BREAST EXCISIONAL BIOPSY Left    neg   CARDIAC CATHETERIZATION Left 08/25/2016   Procedure: Left Heart Cath and Coronary Angiography;  Surgeon: Lamar Blinks, MD;  Location: [facility] INVASIVE CV LAB;  Service: Cardiovascular;  Laterality: Left;   CARDIOVERSION N/A 06/28/2017   Procedure: CARDIOVERSION;  Surgeon: Lamar Blinks, MD;  Location: [facility] ORS;  Service: Cardiovascular;  Laterality: N/A;   CARDIOVERSION N/A 02/06/2018   Procedure: CARDIOVERSION;  Surgeon: Wendall Stade, MD;  Location: [facility] ENDOSCOPY;  Service: Cardiovascular;  Laterality: N/A;   CARDIOVERSION N/A 07/15/2020   Procedure: CARDIOVERSION;  Surgeon: Jake Bathe, MD;  Location: [facility] ENDOSCOPY;  Service: Cardiovascular;  Laterality: N/A;   CATARACT EXTRACTION, BILATERAL  2012   CHOLECYSTECTOMY     colon blockage  1999   COLON SURGERY  2000   removed 20% of colon. colon had collapsed   COLONOSCOPY  2016   polyps removed 2016   DORSAL COMPARTMENT RELEASE Left 09/14/2016   Procedure: RELEASE DORSAL COMPARTMENT (DEQUERVAIN);  Surgeon:  Donato Heinz, MD;  Location: [facility] ORS;  Service: Orthopedics;  Laterality: Left;   ESOPHAGOGASTRODUODENOSCOPY N/A 01/27/2015   Procedure: ESOPHAGOGASTRODUODENOSCOPY (EGD);  Surgeon: Christena Deem, MD;  Location: [facility] ENDOSCOPY;  Service: Endoscopy;  Laterality: N/A;   ESOPHAGOGASTRODUODENOSCOPY (EGD) WITH PROPOFOL N/A 07/24/2015   Procedure: ESOPHAGOGASTRODUODENOSCOPY (EGD) WITH PROPOFOL;  Surgeon: Midge Minium, MD;  Location: [facility] ENDOSCOPY;  Service: Endoscopy;  Laterality: N/A;   ESOPHAGOGASTRODUODENOSCOPY (EGD) WITH PROPOFOL N/A 04/12/2018   Procedure: ESOPHAGOGASTRODUODENOSCOPY (EGD) WITH PROPOFOL;  Surgeon: Wyline Mood, MD;  Location: [facility] ENDOSCOPY;  Service: Gastroenterology;  Laterality: N/A;   ESOPHAGOGASTRODUODENOSCOPY (EGD) WITH PROPOFOL N/A 06/22/2018   Procedure: ESOPHAGOGASTRODUODENOSCOPY (EGD) WITH PROPOFOL;  Surgeon: Rachael Fee, MD;  Location: [facility] ENDOSCOPY;  Service: Endoscopy;  Laterality: N/A;   ESOPHAGOGASTRODUODENOSCOPY (EGD) WITH PROPOFOL N/A 07/09/2018   Procedure: ESOPHAGOGASTRODUODENOSCOPY (EGD) WITH PROPOFOL with resection of gastric polyps;  Surgeon: Wyline Mood, MD;  Location: [facility] ENDOSCOPY;  Service: Gastroenterology;  Laterality: N/A;   ESOPHAGOGASTRODUODENOSCOPY (EGD) WITH PROPOFOL N/A 05/17/2019   Procedure: ESOPHAGOGASTRODUODENOSCOPY (EGD) WITH PROPOFOL;  Surgeon: Wyline Mood, MD;  Location: [facility] ENDOSCOPY;  Service: Gastroenterology;  Laterality: N/A;   EUS N/A 06/22/2018   Procedure: UPPER ENDOSCOPIC ULTRASOUND (EUS) RADIAL;  Surgeon: Rachael Fee, MD;  Location: [facility] ENDOSCOPY;  Service: Endoscopy;  Laterality: N/A;   EYE SURGERY Bilateral    cataract extractions   HARDWARE REMOVAL Right 10/17/2023   Procedure: Right hip removal of nail hardware with screw exchange;  Surgeon: Signa Kell, MD;  Location: [facility] ORS;  Service: Orthopedics;  Laterality: Right;   HUMERUS IM NAIL Left 06/02/2022   Procedure: INTRAMEDULLARY (IM) NAIL HUMERAL;  Surgeon: Christena Flake, MD;  Location: [facility] ORS;  Service: Orthopedics;  Laterality: Left;   INTRAMEDULLARY (IM) NAIL INTERTROCHANTERIC Right 01/21/2023   Procedure: INTRAMEDULLARY (IM) NAIL INTERTROCHANTERIC;  Surgeon: Signa Kell, MD;  Location: [facility] ORS;  Service: Orthopedics;  Laterality: Right;   JOINT REPLACEMENT Bilateral 2014   Bilateral Knee replacement   LEAD REVISION/REPAIR N/A 08/29/2017   Procedure: LEAD REVISION/REPAIR;  Surgeon: Regan Lemming, MD;  Location: [facility] INVASIVE CV LAB;  Service: Cardiovascular;  Laterality: N/A;   OOPHORECTOMY     PACEMAKER INSERTION Left 07/01/2015   Procedure: INSERTION PACEMAKER;  Surgeon: Marcina Millard, MD;  Location: [facility] ORS;  Service: Cardiovascular;  Laterality: Left;   PACEMAKER REVISION N/A 08/28/2017   Procedure: PACEMAKER REVISION;  Surgeon: Duke Salvia, MD;  Location: [facility] INVASIVE CV  LAB;  Service: Cardiovascular;  Laterality: N/A;   REPLACEMENT TOTAL KNEE BILATERAL     SHOULDER ARTHROSCOPY WITH ROTATOR CUFF REPAIR AND OPEN BICEPS TENODESIS Left 11/21/2019   Procedure: LEFT SHOULDER ARTHROSCOPY WITH DEBRIDEMENT, DECOMPRESSION, ROTATOR CUFF REPAIR AND BICEPS TENOLYSIS;  Surgeon: Christena Flake, MD;  Location: [facility] ORS;  Service: Orthopedics;  Laterality: Left;   SHOULDER ARTHROSCOPY WITH SUBACROMIAL DECOMPRESSION Left 2013   TEE WITHOUT CARDIOVERSION N/A 06/28/2017   Procedure: TRANSESOPHAGEAL ECHOCARDIOGRAM (TEE);  Surgeon: Lamar Blinks, MD;  Location: [facility] ORS;  Service: Cardiovascular;  Laterality: N/A;   TUBAL LIGATION     Social History   Socioeconomic History   Marital status: Married    Spouse name: [name]   Number of children: Not on file   Years of education: Not on file   Highest education level: Not on file  Occupational History   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    Comment: retired  Tobacco Use   Smoking status: Never   Smokeless tobacco: Never  Vaping  Use   Vaping status: Never Used  Substance and Sexual Activity   Alcohol use: No   Drug use: No   Sexual activity: Yes  Other Topics Concern   Not on file  Social History Narrative   DIABETIC   PACEMAKER   Social Drivers of Health   Financial Resource Strain: High Risk (02/19/2019)   Received from [facility], [facility]   Overall Financial Resource Strain (CARDIA)    Difficulty of Paying Living Expenses: Very hard  Food Insecurity: No Food Insecurity (10/28/2023)   Hunger Vital Sign    Worried About Running Out of Food in the Last Year: Never true    Ran Out of Food in the Last Year: Never true  Transportation Needs: No Transportation Needs (10/28/2023)   PRAPARE - Administrator, Civil Service (Medical): No    Lack of Transportation (Non-Medical): No  Physical Activity: Unknown (02/19/2019)   Received from [facility],  [facility]   Exercise Vital Sign    Days of Exercise per Week: 0 days    Minutes of Exercise per Session: Not on file  Stress: Stress Concern Present (02/19/2019)   Received from [facility], [facility]   [employer] of Occupational Health - Occupational Stress Questionnaire    Feeling of Stress : To some extent  Social Connections: Unknown (10/28/2023)   Social Connection and Isolation Panel [NHANES]    Frequency of Communication with Friends and Family: Patient unable to answer    Frequency of Social Gatherings with Friends and Family: Patient unable to answer    Attends Religious Services: Patient declined    Database administrator or Organizations: Patient declined    Attends Engineer, structural: Patient declined    Marital Status: Not on file   Family History  Problem Relation Age of Onset   Diabetes Mellitus II Mother    CAD Mother    Heart attack Mother    Cancer Father        skin   Diabetes Mellitus II Brother    Stroke Brother    Breast cancer Neg Hx    Allergies  Allergen Reactions   Fish Allergy Shortness Of Breath    All kinds of fish. Severe vomitting even with smell of fish.    Macrolides And Ketolides Other (See Comments)    Severe stomach pain (mycins)   Meperidine Shortness Of Breath, Nausea Only and Other (See Comments)    Stomach pain    Other Nausea Only, Swelling, Rash, Anaphylaxis, Diarrhea, Shortness Of Breath and Other (See Comments)    Allergen: NON-STEROIDS, bextra - hands and feet swell  Reaction:  Stomach pain   Other Reaction: Intolerance   Prednisone Anaphylaxis and Other (See Comments)    (facial swelling/redness/burning)Increases pts blood sugar  Pt states that she is allergic to all steroids.     Shellfish Allergy Anaphylaxis, Shortness Of Breath, Diarrhea, Nausea Only and Rash    Stomach pain    Sulfa Antibiotics Shortness Of Breath, Diarrhea, Nausea Only and Other  (See Comments)    Reaction:  Stomach pain     Sulfacetamide Sodium Diarrhea, Nausea Only, Other (See Comments) and Shortness Of Breath    Reaction:  Stomach pain    Telbivudine Other (See Comments)    Unknown reaction   Uloric [Febuxostat] Anaphylaxis    Locks pt's body up    Aspirin Rash and Other (See  Comments)    Stomach pain (tolerates lower doses)   Celecoxib Other (See Comments)    GI bleed, weakness, and stomach pain.     Cephalexin Diarrhea, Nausea Only and Other (See Comments)    stomach pain    Codeine Diarrhea, Nausea Only and Other (See Comments)    Reaction:  Stomach pain Pt tolerates morphine    Dilaudid [Hydromorphone Hcl] Other (See Comments)    Reactions: easy to overdose - blood pressure drops really low   Erythromycin Diarrhea, Nausea Only and Other (See Comments)    Reaction:  Stomach pain    Iodinated Contrast Media Rash and Other (See Comments)    Pt states that she is unable to have because she has chronic kidney disease.     Lipitor [Atorvastatin Calcium] Nausea Only    Reaction: nausea, weakness, pass blood   Oxycodone Other (See Comments)    Reaction:  Stomach pain    Aleve [Naproxen]     Reaction: severe stomach pain   Atorvastatin Nausea Only and Other (See Comments)    Weakness    Cephalosporins Diarrhea, Nausea Only and Other (See Comments)    Other: abdominal pain  Tolerated 1st generation cephalosporin (CEFAZOLIN) on 06/02/2022 and 01/21/2023 without documented ADRs.    Doxycycline     Stomach pain    Iodine Rash   Motrin [Ibuprofen]     Reaction: severe stomach pain   Tape Other (See Comments)    Causes pts skin to tear  Pt states that she is able to use paper tape.       Valdecoxib Swelling and Other (See Comments)    Pt states that her hands and feet swell.     Prior to Admission medications   Medication Sig Start Date End Date Taking? Authorizing Provider  acetaminophen (TYLENOL) 325 MG tablet Take 2 tablets (650 mg total) by  mouth every 6 (six) hours as needed for mild pain, moderate pain or fever (or Fever >/= 101). 02/01/23  Yes Wieting, Richard, MD  acetaminophen (TYLENOL) 500 MG tablet Take 2 tablets (1,000 mg total) by mouth every 8 (eight) hours. 10/17/23 10/16/24 Yes Signa Kell, MD  albuterol (VENTOLIN HFA) 108 (90 Base) MCG/ACT inhaler Inhale 2 puffs into the lungs every 6 (six) hours as needed for wheezing or shortness of breath (cough).   Yes [provider]  allopurinol (ZYLOPRIM) 100 MG tablet Take 100 mg by mouth in the morning.   Yes [provider]  alum & mag hydroxide-simeth (MAALOX/MYLANTA) 200-200-20 MG/5ML suspension Take 10 mLs by mouth 3 (three) times daily as needed for indigestion or heartburn.   Yes [provider]  amiodarone (PACERONE) 200 MG tablet Take 0.5 tablets (100 mg total) by mouth daily. 07/11/23  Yes Sherie Don, NP  apixaban (ELIQUIS) 2.5 MG TABS tablet Take 1 tablet (2.5 mg total) by mouth 2 (two) times daily. 03/06/22  Yes Sunnie Nielsen, DO  Cholecalciferol (VITAMIN D) 50 MCG (2000 UT) CAPS Take 2,000 Units by mouth in the morning.   Yes [provider]  docusate sodium (COLACE) 100 MG capsule Take 100 mg by mouth 2 (two) times daily.   Yes [provider]  Ergocalciferol (VITAMIN D2) 50 MCG (2000 UT) TABS Take 1 tablet by mouth daily.   Yes [provider]  estradiol (ESTRACE) 0.1 MG/GM vaginal cream Place 1 Applicatorful vaginally every Tuesday, Thursday, and Saturday at 6 PM.   Yes [provider]  famotidine (PEPCID) 10 MG tablet Take 10  mg by mouth in the morning.   Yes [provider]  Fluticasone Furoate (FLONASE SENSIMIST NA) Place 1 spray into the nose in the morning.   Yes [provider]  furosemide (LASIX) 20 MG tablet Take 1 tablet (20 mg total) by mouth daily. 02/02/23  Yes Wieting, Richard, MD  gabapentin (NEURONTIN) 300 MG capsule Take 600 mg by mouth 3 (three) times daily. (0800, 1400  & 2000)   Yes [provider]  ibandronate (BONIVA) 150 MG tablet Take 150 mg by mouth every 30 (thirty) days. Take in the morning with a full glass of water, on an empty stomach, and do not take anything else by mouth or lie down for the next 30 min.   Yes [provider]  Insulin Regular Human (NOVOLIN R FLEXPEN RELION) 100 UNIT/ML KwikPen Inject 0-10 Units into the skin as directed. Check blood sugar 3 times daily and inject insulin per sliding scale: Less than 70=0 units and eat a snack recheck in 15 minutes; 100-120=2 units 121-150=3 units 151-200=4 units 201-250=5 units 251-300=6 units 301-350=7 units 351-400=8 units 401-450=9 units 451-500=10 units; call MD for reading greater than 500.   Yes [provider]  LANTUS 100 UNIT/ML injection Inject 0.09 mLs (9 Units total) into the skin at bedtime. Patient taking differently: Inject 19 Units into the skin at bedtime. 02/01/23  Yes Wieting, Richard, MD  levothyroxine (SYNTHROID) 75 MCG tablet Take 37.5 mcg by mouth daily before breakfast. (0600)   Yes [provider]  magnesium hydroxide (MILK OF MAGNESIA) 400 MG/5ML suspension Take 30 mLs by mouth daily as needed (constipation).   Yes [provider]  midodrine (PROAMATINE) 5 MG tablet Take 5 mg by mouth 3 (three) times daily with meals. (0730, 1130 & 1600) DO NOT LIE DOWN AFTER TAKING, DO NOT TAKE AFTER EVENING MEAL. TAKE LAST DOSE OF THE DAY AT LEAST 4 HOURS BEFORE BEDTIME   Yes [provider]  Multiple Vitamin (MULTIVITAMIN WITH MINERALS) TABS tablet Take 1 tablet by mouth daily. 02/02/23  Yes Wieting, Richard, MD  nystatin cream (MYCOSTATIN) Apply 1 Application topically 2 (two) times daily as needed (rash).   Yes [provider]  nystatin powder Apply 1 Application topically in the morning and at bedtime. (0800 & 2000) Apply topically to groin   Yes [provider]  ondansetron (ZOFRAN-ODT) 4 MG disintegrating tablet  Take 1 tablet (4 mg total) by mouth every 8 (eight) hours as needed for nausea or vomiting. 10/17/23  Yes Signa Kell, MD  polyethylene glycol (MIRALAX / GLYCOLAX) 17 g packet Take 17 g by mouth daily. 02/01/23  Yes Wieting, Richard, MD  Polyvinyl Alcohol-Povidone PF 1.4-0.6 % SOLN Place 1 drop into both eyes in the morning and at bedtime.   Yes [provider]  rOPINIRole (REQUIP) 0.5 MG tablet Take 0.5 mg by mouth at bedtime. (2000)   Yes [provider]  senna (SENOKOT) 8.6 MG tablet Take 1 tablet by mouth at bedtime.   Yes [provider]  sertraline (ZOLOFT) 100 MG tablet Take 100 mg by mouth in the morning. (0800) 100 mg + 25 mg=125 mg   Yes [provider]  sertraline (ZOLOFT) 25 MG tablet Take 25 mg by mouth in the morning. (0800) 25 mg + 100 mg=125 mg   Yes [provider]  solifenacin (VESICARE) 5 MG tablet Take 5 mg by mouth in the morning. (0800)   Yes [provider]  thiamine (VITAMIN B-1) 100 MG tablet  Take 1 tablet (100 mg total) by mouth daily. 02/02/23  Yes Wieting, Richard, MD  traMADol (ULTRAM) 50 MG tablet Take 1 tablet (50 mg total) by mouth every 6 (six) hours as needed for moderate pain. Patient taking differently: Take 50 mg by mouth 2 (two) times daily as needed (pain.). 01/26/23  Yes Dedra Skeens, PA-C  carboxymethylcellulose (REFRESH PLUS) 0.5 % SOLN 1 drop 3 (three) times daily as needed. Patient not taking: Reported on 10/28/2023    [provider]  folic acid (FOLVITE) 1 MG tablet Take 1 tablet (1 mg total) by mouth daily. Patient not taking: Reported on 10/28/2023 02/02/23   Alford Highland, MD  Polyethyl Glycol-Propyl Glycol 0.4-0.3 % SOLN Place 1 drop into both eyes See admin instructions. Instill 1 drop into both eye scheduled twice daily (0800 & 2000) & may instill 1 drop into both eyes twice daily if needed for itching eyes. Patient not taking: Reported on 10/28/2023    [provider]  traMADol  (ULTRAM) 50 MG tablet Take 1 tablet (50 mg total) by mouth every 4 (four) hours as needed for severe pain (pain score 7-10) or moderate pain (pain score 4-6). Patient not taking: Reported on 10/28/2023 10/17/23 10/16/24  Signa Kell, MD  vitamin B-12 (CYANOCOBALAMIN) 1000 MCG tablet Take 1,000 mcg by mouth in the morning. Patient not taking: Reported on 10/28/2023    [provider]   Recent Labs    10/28/23 0033 10/28/23 5409 10/29/23 8119 10/29/23 0831 10/29/23 1059 10/29/23 1516 10/29/23 2212 10/30/23 0438  WBC 4.8  --   --   --   --   --   --  7.5  HGB 9.7*   < > 9.1*  --  9.0* 9.0* 8.8* 8.8*  HCT 29.6*   < > 28.5*  --  27.8* 28.2* 28.1* 28.0*  PLT 211  --   --   --   --   --   --  164  K  --    < > 4.8  --   --   --   --  5.0  CL  --    < > 105  --   --   --   --  100  CO2  --    < > 23  --   --   --   --  23  BUN  --    < > 36*  --   --   --   --  36*  CREATININE  --    < > 1.44*  --   --   --   --  1.30*  GLUCOSE  --    < > 212*  --   --   --   --  361*  CALCIUM  --    < > 7.4*  --   --   --   --  8.0*  INR  --    < >  --  1.1  --   --   --  1.2   < > = values in this interval not displayed.   DG Chest Port 1 View Result Date: 10/28/2023 CLINICAL DATA:  Status post PICC placement EXAM: PORTABLE CHEST 1 VIEW COMPARISON:  01/21/2023 FINDINGS: Cardiac shadow is stable. Pacing device is again noted. Postsurgical changes are seen. Right PICC is noted with the tip at the cavoatrial junction. Lungs are well aerated without focal infiltrate or effusion. No bony abnormality is noted. IMPRESSION: PICC in  satisfactory position Electronically Signed   By: Alcide Clever M.D.   On: 10/28/2023 19:32   [place] EKG SITE RITE Result Date: 10/28/2023 If Site Rite image not attached, placement could not be confirmed due to current cardiac rhythm.  CT ABDOMEN PELVIS WO CONTRAST Result Date: 10/28/2023 CLINICAL DATA:  Abdominal pain, acute, nonlocalized. Right hip pain. EXAM: CT ABDOMEN AND PELVIS  WITHOUT CONTRAST TECHNIQUE: Multidetector CT imaging of the abdomen and pelvis was performed following the standard protocol without IV contrast. RADIATION DOSE REDUCTION: This exam was performed according to the departmental dose-optimization program which includes automated exposure control, adjustment of the mA and/or kV according to patient size and/or use of iterative reconstruction technique. COMPARISON:  CT scan pelvis from 04/22/2023 and CT scan abdomen and pelvis from 03/04/2022. FINDINGS: Lower chest: There are subpleural atelectatic changes in the visualized lung bases. No overt consolidation. No pleural effusion. The heart is normal in size. No pericardial effusion. Partially seen cardiac pacemaker wires. Hepatobiliary: The liver is normal in size. Non-cirrhotic configuration. No suspicious mass. No intrahepatic bile duct dilation. There is mild prominence of the extrahepatic bile duct, most likely due to post cholecystectomy status. Gallbladder is surgically absent. Pancreas: Unremarkable. No pancreatic ductal dilatation or surrounding inflammatory changes. Spleen: Within normal limits. No focal lesion. Adrenals/Urinary Tract: Adrenal glands are unremarkable. No suspicious renal mass within the limitations of this unenhanced exam. No nephroureterolithiasis or obstructive uropathy. Urinary bladder is under distended, precluding optimal assessment. However, no large mass or stones identified. No perivesical fat stranding. Stomach/Bowel: No disproportionate dilation of the small or large bowel loops. No evidence of abnormal bowel wall thickening or inflammatory changes. The appendix is surgically absent. There are scattered diverticula mainly in the sigmoid colon, without imaging signs of diverticulitis. Vascular/Lymphatic: No ascites or pneumoperitoneum. No abdominal or pelvic lymphadenopathy, by size criteria. No aneurysmal dilation of the major abdominal arteries. There are moderate peripheral  atherosclerotic vascular calcifications of the aorta and its major branches. Reproductive: The uterus is surgically absent. No large adnexal mass. Other: The visualized soft tissues and abdominal wall are unremarkable. Redemonstration of right proximal thigh intramuscular hematoma, better evaluated on the dedicated right hip CT scan performed earlier the same day. The soft tissues and abdominal wall are otherwise unremarkable. Musculoskeletal: No suspicious osseous lesions. There are moderate multilevel degenerative changes in the visualized spine. T12 and L1 vertebroplasty changes noted. There is posterior spinal fixation of L4 through S1 with transpedicular screws and rods. Postsurgical changes of right hip ORIF noted. IMPRESSION: 1. No acute inflammatory process identified within the abdomen or pelvis. 2. Redemonstration of right proximal thigh intramuscular hematoma. Please refer to separately dictated CT scan right hip performed earlier the same day report for details. 3. Multiple other nonacute observations, as described above. Aortic Atherosclerosis (ICD10-I70.0). Electronically Signed   By: Jules Schick M.D.   On: 10/28/2023 09:25     Positive ROS: All other systems have been reviewed and were otherwise negative with the exception of those mentioned in the HPI and as above.  Physical Exam: BP 91/61   Pulse 62   Temp 98.8 F (37.1 C) (Oral)   Resp (!) 26   Ht 5\' 3"  (1.6 m)   Wt 72.8 kg   SpO2 100%   BMI 28.43 kg/m  General:  Alert, no acute distress Psychiatric:  Patient is competent for consent with normal mood and affect    Orthopedic Exam:  RLE: 5/5 DF/PF/EHL SILT grossly over foot Foot wwp Negative Log  roll/axial load Able to gently perform Rom of R hip Moderate tenderness to palpation about lateral hip Swelling over lateral hip present with associated ecchymoses proximal and distal to distal incision.   Imaging:  As above: Significant deep hematoma of anterolateral thigh  distal to distal incision  Assessment/Plan: SINA SUMPTER is a 83 y.o. female with a large hematoma about the R anterolateral thigh s/p cephalomedullary screw exchange on 10/17/23.    1. I discussed the various treatment options including both surgical and non-surgical management of the hematoma.  We discussed that with nonoperative management, the patient would likely experience resolution of the hematoma over time.  This could take a few months to resolve completely, but she would hopefully have symptomatic improvement sooner.  We discussed that hemoglobin was likely expanding due to anticoagulation use.  We did discuss that surgical management could include evacuation of hematoma.  She may feel symptomatic improvement sooner, but we would be at risk of further complication.  These would include repeat bleeding, possibility of significantly extending incision and increasing risk of infection, and risk of anesthesia, especially in the setting of patient's URI that was found to be due to RSV.  Given that her lateral thigh pain is not severe currently, her incisions appear to be healing well, and her blood counts are stable, we agreed to defer surgical intervention at this time.  We will plan to treat this hematoma nonoperatively for now.  We will plan to reevaluate the patient as an outpatient next week to monitor how she is progressing.  2.  Patient may have regular diet.  3.  Recommend mobilization with PT/OT when medically appropriate  4.  Will plan to follow patient peripherally while inpatient.  Please page with any further questions, concerns, or change in status.   Signa Kell   10/30/2023 7:37 AM

## 2023-10-30 NOTE — TOC Initial Note (Signed)
Transition of Care [facility]) - Initial/Assessment Note    Patient Details  Name: Deborah Obrien MRN: [id number] Date of Birth: [birth year]  Transition [facility]) CM/SW Contact:    Allena Katz, LCSW Phone Number: 10/30/2023, 4:27 PM  Clinical Narrative:  Pt admitted from [facility].  IR is being consulted to see if hematoma can be drained. TOC will continue to follow.   Expected Discharge Plan: Assisted Living Barriers to Discharge: Continued Medical Work up   Patient Goals and CMS Choice   CMS Medicare.gov Compare Post Acute Care list provided to:: Patient Choice offered to / list presented to : Patient      Expected Discharge Plan and Services       Living arrangements for the past 2 months: Assisted Living Facility                                      Prior Living Arrangements/Services Living arrangements for the past 2 months: Assisted Living Facility Lives with:: Facility Resident                   Activities of Daily Living      Permission Sought/Granted                  Emotional Assessment       Orientation: : Oriented to Self, Oriented to Place, Oriented to  Time, Oriented to Situation      Admission diagnosis:  Hematoma [T14.8XXA] Hematoma of right hip [S70.01XA] Hypotension, unspecified hypotension type [I95.9] Anemia, unspecified type [D64.9] Patient Active Problem List   Diagnosis Date Noted   Hematoma of right hip 10/28/2023   Acute respiratory failure with hypoxia and hypercapnia (HCC) 02/01/2023   Chronic kidney disease, stage 4 (severe) (HCC) 02/01/2023   Paroxysmal atrial fibrillation (HCC) 01/24/2023   Pulmonary hypertension (HCC) 01/22/2023   Metabolic acidosis 01/22/2023   Fall 01/22/2023   Transaminitis 01/21/2023   Hypotension after procedure 01/21/2023   Shock (HCC) 01/21/2023   Closed fracture of right hip (HCC) 01/21/2023   Nausea 01/21/2023   Chronic heart failure with preserved ejection  fraction (HCC) 01/01/2023   Restless leg syndrome 05/31/2022   Hypothyroidism 05/30/2022   Acute renal failure superimposed on stage 4 chronic kidney disease (HCC) 05/30/2022   Drop in hemoglobin 05/30/2022   Elevated liver function tests 05/30/2022   History of gout 05/30/2022   Humerus fracture 05/29/2022   Abnormal urinalysis 03/05/2022   Hypoglycemia 03/04/2022   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/02/2022   Anemia in chronic kidney disease 03/02/2022   Congestive heart failure (HCC) 03/02/2022   Osteoporosis 02/13/2020   Primary osteoarthritis of left shoulder 11/10/2019   Uses walker 09/27/2018   UTI (urinary tract infection) 06/14/2018   Type 2 diabetes mellitus with renal manifestations (HCC) 05/02/2018   Chronic anticoagulation 04/09/2018   Cardiac pacemaker in situ 04/09/2018   H/O TIA (transient ischemic attack) and stroke 04/09/2018   CAD (coronary artery disease) 04/09/2018   Hypotension 02/04/2018   Sinus node dysfunction (HCC) 08/24/2017   Dizziness 06/12/2017   Nephrolithiasis 02/22/2016   Diabetic neuropathy with neurologic complication (HCC) 11/30/2015   Recurrent major depressive disorder, in remission (HCC) 08/11/2015   Polyp of stomach and duodenum 07/29/2015   Gastric polyp    Gastro-esophageal reflux disease without esophagitis    Permanent atrial fibrillation (HCC) 06/29/2015   Intestinal obstruction (  HCC) 02/20/2015   DDD (degenerative disc disease), lumbar 01/19/2015   Back pain 01/12/2015   Gout 01/12/2015   Intervertebral disc disorder with radiculopathy of lumbar region 09/17/2014   Lumbar radiculitis 09/17/2014   Lumbar stenosis with neurogenic claudication 09/17/2014   Neuritis or radiculitis due to rupture of lumbar intervertebral disc 09/17/2014   Atherosclerosis of both carotid arteries 07/15/2014   Hyperlipemia, mixed 07/15/2014   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 [place] [address] Phone: [phone] Fax: [phone]     Social Drivers of Health (SDOH) Social History: SDOH Screenings   Food Insecurity: No Food Insecurity (10/28/2023)  Housing: Low Risk  (10/28/2023)  Transportation Needs: No Transportation Needs (10/28/2023)  Utilities: Not At Risk (10/28/2023)  Financial Resource Strain: High Risk (02/19/2019)   Received from [facility], [facility]  Physical Activity: Unknown (02/19/2019)   Received from [facility], [facility]  Social Connections: Unknown (10/28/2023)  Stress: Stress Concern Present (02/19/2019)   Received from [facility], [facility]  Tobacco Use: Low Risk  (10/17/2023)   SDOH Interventions:     Readmission Risk Interventions    10/30/2023    4:25 PM  Readmission Risk Prevention Plan  Transportation Screening Complete  Medication Review (RN Care Manager) Complete  PCP or Specialist appointment within 3-5 days of discharge Complete  HRI or Home Care Consult Complete  Palliative Care Screening Complete  Skilled Nursing Facility Complete

## 2023-10-30 NOTE — Inpatient Diabetes Management (Signed)
Inpatient Diabetes Program Recommendations  AACE/ADA: New Consensus Statement on Inpatient Glycemic Control   Target Ranges:  Prepandial:   less than 140 mg/dL      Peak postprandial:   less than 180 mg/dL (1-2 hours)      Critically ill patients:  140 - 180 mg/dL    Latest Reference Range & Units 10/29/23 08:20 10/29/23 11:20 10/29/23 16:27 10/29/23 22:07 10/30/23 07:49  Glucose-Capillary 70 - 99 mg/dL 161 (H) 096 (H) 045 (H) 292 (H) 227 (H)   Review of Glycemic Control  Diabetes history: DM2 Outpatient Diabetes medications: Lantus 19 units at bedtime, Novolin Regular 0-10 units TID with meals Current orders for Inpatient glycemic control: Novolog 0-9 units TID with meals, Novolog 0-5 units QHS  Inpatient Diabetes Program Recommendations:    Insulin: Please consider ordering Semglee 5 units Q24H (to start now).  Thanks, Orlando Penner, RN, MSN, CDCES Diabetes Coordinator Inpatient Diabetes Program [phone] (Team Pager from 8am to 5pm)

## 2023-10-30 NOTE — Progress Notes (Addendum)
NAME:  Deborah Obrien, MRN:  [id number], DOB:  1940-12-10, LOS: 2 ADMISSION DATE:  10/27/2023, CONSULTATION DATE: 10/28/2023 REFERRING MD: Dr. Elesa Massed, CHIEF COMPLAINT: Right hip pain    History of Present Illness:  This is an 83 yo female who presented to [facility] ER on 02/14 via EMS from [facility] with c/o right hip and right sided abdominal pain.  She recently presented to [facility] ER on 02/5 with postoperative bleeding following an outpatient right hip removal of nail hardware with screw exchange on 02/4.  She resumed her outpatient apixaban post procedure.  During that ER presentation CT Right Hip revealed intramuscular hematoma.  EDP contacted on call orthopedic surgeon Dr. Audelia Acton and he recommended hip spica Ace wrap pressure dressing, continuing apixaban to prevent blood clots.  Pt discharged back to [facility].  However, she later developed severe right hip pain prompting return to the ER.    ED Course  Upon arrival to the ER pt hypotensive map's 54 to 60's.  She received 1L NS bolus, however she remained hypotensive requiring peripheral levophed gtt.  Significant lab results were: BUN 30/creatinine 1.35/calcium 8.5/magnesium 1.6/alk phos 135/albumin 3.3/AST 44/hgb 9.7.  CT Right Hip revealed interval increase in size of the previously identified intramuscular hematoma within the anterolateral right thigh encasing the proximal right femoral shaft and centered within the vastus intermedius.  Her last dose of apixaban was the evening of 02/14.  Pt also noted to have severe right abdominal pain.  CT Abd Pelvis negative for acute inflammatory process within the abdomen or pelvis.  PCCM contacted for ICU admission.  Orthopedic surgeon Dr. Joice Lofts consulted to assist with management.    Pertinent  Medical History  Anemia  Anxiety  Aortic Atherosclerosis  Bowel Obstruction  CAD Cerebral Microvascular Disease HFrEF  CKD Stage IV  Colon Polyp  Degenerative Disc Disease  Depression  Diabetic  Neuropathy  Fatty Liver  GERD  GI Bleed  Gout  HTN  Hypothyroidism  Paroxysmal Atrial Fibrillation (pt on apixaban) Permanent Cardiac Pacemaker  Pulmonary Hypertension  Type II Diabetes Mellitus  TIA  Vascular Dementia   Significant Hospital Events: Including procedures, antibiotic start and stop dates in addition to other pertinent events   02/15: Pt admitted to ICU with hemorrhagic shock secondary to right hip intramuscular hematoma after resuming apixaban following right hip removal of nail hardware with screw exchange on 02/4 2/16: IR consulted for possible drainage of right thigh/hip hematoma and insertion of pigtail catheter to reduce reaccumulation of blood per ortho recommendations.  Pt remains on levophed gtt @6  mcg/min, requirements have increased.  Respiratory viral panel positive for RSV.  Per IR aspiration and drain placement will not drain the hematoma because it is intramuscular and a solid clot.  Ortho Dr. Joice Lofts stated he will discuss case with Ortho Dr. Allena Katz tomorrow to determine plan of care. 2/16: Stable, no significant events overnight  Interim History / Subjective:  See significant events above  Objective   Blood pressure (!) 123/41, pulse (!) 54, temperature 98.7 F (37.1 C), temperature source Oral, resp. rate 18, height 5\' 3"  (1.6 m), weight 72.8 kg, SpO2 100%.      Intake/Output Summary (Last 24 hours) at 10/30/2023 0458 Last data filed at 10/30/2023 0241 Gross per 24 hour  Intake 1110.6 ml  Output 800 ml  Net 310.6 ml   Filed Weights   10/28/23 1638 10/29/23 0500 10/30/23 0458  Weight: 74.4 kg 72.5 kg 72.8 kg    Examination: General: Acutely-ill appearing  female, NAD on 2L O2 via nasal canula  HENT: Supple, no JVD  Lungs: Rhonchi throughout, even, non labored  Cardiovascular: Paced rhythm, no m/r/g.  2+ radial/1+ distal pulses, 1+ right hip edema  Abdomen: +BS x4, obese, right quadrant tenderness with palpation  Extremities: Moves all  extremities, limited right hip ROM due to moderate pain  Skin: Right surgical incision site with ecchymosis, RLQ ecchymosis  Neuro: Alert and oriented, following commands, PERRLA GU: Purewick in place draining yellow urine   Resolved Hospital Problem list     Assessment & Plan:   #Acute pain  - Prn tylenol, percocet and fentanyl for pain management   #Acute respiratory failure secondary to RSV - Supplemental O2 for dyspnea and/or hypoxia  - Maintain O2 sats 92% or higher  - Prn bronchodilator therapy  - prn robitussin  - Aggressive pulmonary hygiene   #HFrEF Hx: CAD, permanent pacemaker, HTN, paroxysmal atrial fibrillation, and pulmonary hypertension  - Continuous telemetry monitoring  - Hold outpatient lasix  - Continue outpatient midodrine   #Acute kidney injury secondary to ATN~improving   #Hypomagnesia  - Trend BMP  - Replace electrolytes as indicated  - Strict I&O's  - Avoid nephrotoxic agents as able   #Right hip intramuscular hematoma secondary to anticoagulation post right hip removal of nail hardware with screw exchange on 02/4 #Hemorrhagic shock  - Prn pRBC's and/or levophed gtt to maintain map 65 or higher  - Apixaban reversed with kcentra~02/15  - Avoid chemical VTE px for now - Trend H&H  - Orthopedic surgery consulted appreciate input: recommended IR consult for drainage of hematoma with drain placement  - IR reviewed imaging due to solid acute clot and intramuscular hematoma the hematoma will not drain with aspiration or drain placement.  Orthopedic Surgeon will discuss case with orthopedic surgeon Dr. Allena Katz on 02/17(he performed the screw exchange)   GERD - Continue outpatient pepcid  Hypothyroidism - Resume outpatient synthroid  Type II diabetes mellitus  - CBG's ac/hs - SSI  - Target CBG 140 to 180 - Follow hyper/hypoglycemic protocol   Best Practice (right click and "Reselect all SmartList Selections" daily)   Diet/type: Regular consistency  (see orders) DVT prophylaxis SCD Pressure ulcer(s): N/A GI prophylaxis: H2B Lines: Right Upper Extremity PICC Line and still needed  Foley:  N/A Code Status:  full code Last date of multidisciplinary goals of care discussion [10/29/23]  02/16: Updated pt at bedside and pts son Briseidy Spark via telephone regarding pts condition and current plan of care.  All questions were answered  Labs   CBC: Recent Labs  Lab 10/28/23 0033 10/28/23 0452 10/29/23 0428 10/29/23 1059 10/29/23 1516 10/29/23 2212 10/30/23 0438  WBC 4.8  --   --   --   --   --  7.5  NEUTROABS 3.0  --   --   --   --   --  5.6  HGB 9.7*   < > 9.1* 9.0* 9.0* 8.8* 8.8*  HCT 29.6*   < > 28.5* 27.8* 28.2* 28.1* 28.0*  MCV 98.0  --   --   --   --   --  102.9*  PLT 211  --   --   --   --   --  164   < > = values in this interval not displayed.    Basic Metabolic Panel: Recent Labs  Lab 10/28/23 0307 10/28/23 1123 10/29/23 0428  NA 141  --  138  K 3.9  --  4.8  CL 106  --  105  CO2 25  --  23  GLUCOSE 81  --  212*  BUN 30*  --  36*  CREATININE 1.35*  --  1.44*  CALCIUM 8.5*  --  7.4*  MG 1.6* 2.2 1.9  PHOS 3.3  --  3.5   GFR: Estimated Creatinine Clearance: 28.3 mL/min (A) (by C-G formula based on SCr of 1.44 mg/dL (H)). Recent Labs  Lab 10/28/23 0033 10/30/23 0438  WBC 4.8 7.5  LATICACIDVEN 1.2  --     Liver Function Tests: Recent Labs  Lab 10/28/23 0307  AST 44*  ALT 23  ALKPHOS 135*  BILITOT 1.1  PROT 6.2*  ALBUMIN 3.3*   No results for input(s): "LIPASE", "AMYLASE" in the last 168 hours. No results for input(s): "AMMONIA" in the last 168 hours.  ABG    Component Value Date/Time   PHART 7.35 10/29/2023 0500   PCO2ART 45 10/29/2023 0500   PO2ART 98 10/29/2023 0500   HCO3 24.8 10/29/2023 0500   TCO2 24 07/15/2020 0921   ACIDBASEDEF 1.1 10/29/2023 0500   O2SAT 99.2 10/29/2023 0500     Coagulation Profile: Recent Labs  Lab 10/28/23 1123 10/29/23 0831 10/30/23 0438  INR 1.1  1.1 1.2    Cardiac Enzymes: Recent Labs  Lab 10/28/23 1123  CKTOTAL 89    HbA1C: Hemoglobin A1C  Date/Time Value Ref Range Status  09/26/2012 12:00 PM 7.5 (H) 4.2 - 6.3 % Final    Comment:    The American Diabetes Association recommends that a primary goal of therapy should be <7% and that physicians should reevaluate the treatment regimen in patients with HbA1c values consistently >8%.    Hgb A1c MFr Bld  Date/Time Value Ref Range Status  10/10/2023 12:39 PM 9.2 (H) 4.8 - 5.6 % Final    Comment:    (NOTE) Pre diabetes:          5.7%-6.4%  Diabetes:              >6.4%  Glycemic control for   <7.0% adults with diabetes   01/21/2023 04:20 AM 6.8 (H) 4.8 - 5.6 % Final    Comment:    (NOTE) Pre diabetes:          5.7%-6.4%  Diabetes:              >6.4%  Glycemic control for   <7.0% adults with diabetes     CBG: Recent Labs  Lab 10/28/23 2145 10/29/23 0820 10/29/23 1120 10/29/23 1627 10/29/23 2207  GLUCAP 185* 249* 157* 110* 292*    Review of Systems: Positives in BOLD   Gen: Denies fever, chills, weight change, fatigue, night sweats HEENT: Denies blurred vision, double vision, hearing loss, tinnitus, sinus congestion, rhinorrhea, sore throat, neck stiffness, dysphagia PULM: Denies shortness of breath, cough, sputum production, hemoptysis, wheezing CV: Denies chest pain, edema, orthopnea, paroxysmal nocturnal dyspnea, palpitations GI: abdominal pain, nausea, vomiting, diarrhea, hematochezia, melena, constipation, change in bowel habits GU: Denies dysuria, hematuria, polyuria, oliguria, urethral discharge Endocrine: Denies hot or cold intolerance, polyuria, polyphagia or appetite change Musculo: Right hip pain Derm: Denies rash, dry skin, scaling or peeling skin change Heme: Denies easy bruising, bleeding, bleeding gums Neuro: Denies headache, numbness, weakness, slurred speech, loss of memory or consciousness  Past Medical History:  She,  has a past  medical history of Acute respiratory failure (HCC) (04/09/2018), Anemia, Anxiety, Aortic atherosclerosis (HCC), Bowel obstruction (HCC), Carotid artery disease (HCC), Cerebral microvascular disease, Chronic heart  failure with preserved ejection fraction (HFpEF) (HCC), CKD (chronic kidney disease), stage IV (HCC), Closed fracture of one rib of right side (05/29/2020), Colon polyp, Complete tear of left rotator cuff (10/16/2014), Coronary artery disease (non-obstructive) (08/25/2016), DDD (degenerative disc disease), cervical, Depression, Diabetic neuropathy (HCC), Diarrhea (03/04/2022), Displaced intertrochanteric fracture of right femur (HCC), Fatty liver, Gastritis without bleeding, GERD (gastroesophageal reflux disease), GI bleed, Gout, Hypertension, Hypothyroidism, Long term current use of amiodarone, Low back pain, Morbid obesity (HCC), Multiple gastric polyps, 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), On apixaban therapy, Osteoarthritis, Osteoporosis, PAF (paroxysmal atrial fibrillation) (HCC) (2016), Presence of permanent cardiac pacemaker (07/01/2015), Pulmonary hypertension (HCC), Sinus node dysfunction (HCC), T2DM (type 2 diabetes mellitus) (HCC), Tendinitis of upper biceps tendon of left shoulder (11/10/2019), TIA (transient ischemic attack), Type 2 diabetes mellitus with neurological complications (HCC) (05/02/2018), Uncontrolled type 2 diabetes mellitus with hypoglycemia, with long-term current use of insulin (HCC) (03/04/2022), Urinary incontinence (10/17/2022), and Vascular dementia, moderate, with anxiety (HCC).   Surgical History:   Past Surgical History:  Procedure Laterality Date   ABDOMINAL HYSTERECTOMY  1980   APPENDECTOMY     BACK SURGERY     2008. plate & screws    BACK SURGERY  2019   patient describes kyphoplasty for compression fractures, MD referral said fusion   BREAST BIOPSY Right 04/2015   stereo fibroadenomatous change, neg for atypia   BREAST EXCISIONAL BIOPSY Left    neg    CARDIAC CATHETERIZATION Left 08/25/2016   Procedure: Left Heart Cath and Coronary Angiography;  Surgeon: Lamar Blinks, MD;  Location: [facility] INVASIVE CV LAB;  Service: Cardiovascular;  Laterality: Left;   CARDIOVERSION N/A 06/28/2017   Procedure: CARDIOVERSION;  Surgeon: Lamar Blinks, MD;  Location: [facility] ORS;  Service: Cardiovascular;  Laterality: N/A;   CARDIOVERSION N/A 02/06/2018   Procedure: CARDIOVERSION;  Surgeon: Wendall Stade, MD;  Location: [facility] ENDOSCOPY;  Service: Cardiovascular;  Laterality: N/A;   CARDIOVERSION N/A 07/15/2020   Procedure: CARDIOVERSION;  Surgeon: Jake Bathe, MD;  Location: [facility] ENDOSCOPY;  Service: Cardiovascular;  Laterality: N/A;   CATARACT EXTRACTION, BILATERAL  2012   CHOLECYSTECTOMY     colon blockage  1999   COLON SURGERY  2000   removed 20% of colon. colon had collapsed   COLONOSCOPY  2016   polyps removed 2016   DORSAL COMPARTMENT RELEASE Left 09/14/2016   Procedure: RELEASE DORSAL COMPARTMENT (DEQUERVAIN);  Surgeon: Donato Heinz, MD;  Location: [facility] ORS;  Service: Orthopedics;  Laterality: Left;   ESOPHAGOGASTRODUODENOSCOPY N/A 01/27/2015   Procedure: ESOPHAGOGASTRODUODENOSCOPY (EGD);  Surgeon: Christena Deem, MD;  Location: [facility] ENDOSCOPY;  Service: Endoscopy;  Laterality: N/A;   ESOPHAGOGASTRODUODENOSCOPY (EGD) WITH PROPOFOL N/A 07/24/2015   Procedure: ESOPHAGOGASTRODUODENOSCOPY (EGD) WITH PROPOFOL;  Surgeon: Midge Minium, MD;  Location: [facility] ENDOSCOPY;  Service: Endoscopy;  Laterality: N/A;   ESOPHAGOGASTRODUODENOSCOPY (EGD) WITH PROPOFOL N/A 04/12/2018   Procedure: ESOPHAGOGASTRODUODENOSCOPY (EGD) WITH PROPOFOL;  Surgeon: Wyline Mood, MD;  Location: [facility] ENDOSCOPY;  Service: Gastroenterology;  Laterality: N/A;   ESOPHAGOGASTRODUODENOSCOPY (EGD) WITH PROPOFOL N/A 06/22/2018   Procedure: ESOPHAGOGASTRODUODENOSCOPY (EGD) WITH PROPOFOL;  Surgeon: Rachael Fee, MD;  Location[facility] ENDOSCOPY;  Service: Endoscopy;  Laterality: N/A;    ESOPHAGOGASTRODUODENOSCOPY (EGD) WITH PROPOFOL N/A 07/09/2018   Procedure: ESOPHAGOGASTRODUODENOSCOPY (EGD) WITH PROPOFOL with resection of gastric polyps;  Surgeon: Wyline Mood, MD;  Location: [facility] ENDOSCOPY;  Service: Gastroenterology;  Laterality: N/A;   ESOPHAGOGASTRODUODENOSCOPY (EGD) WITH PROPOFOL N/A 05/17/2019   Procedure: ESOPHAGOGASTRODUODENOSCOPY (EGD) WITH PROPOFOL;  Surgeon: Wyline Mood, MD;  Location: [facility] ENDOSCOPY;  Service: Gastroenterology;  Laterality: N/A;   EUS N/A 06/22/2018   Procedure: UPPER ENDOSCOPIC ULTRASOUND (EUS) RADIAL;  Surgeon: Rachael Fee, MD;  Location: [facility] ENDOSCOPY;  Service: Endoscopy;  Laterality: N/A;   EYE SURGERY Bilateral    cataract extractions   HARDWARE REMOVAL Right 10/17/2023   Procedure: Right hip removal of nail hardware with screw exchange;  Surgeon: Signa Kell, MD;  Location: [facility] ORS;  Service: Orthopedics;  Laterality: Right;   HUMERUS IM NAIL Left 06/02/2022   Procedure: INTRAMEDULLARY (IM) NAIL HUMERAL;  Surgeon: Christena Flake, MD;  Location: [facility] ORS;  Service: Orthopedics;  Laterality: Left;   INTRAMEDULLARY (IM) NAIL INTERTROCHANTERIC Right 01/21/2023   Procedure: INTRAMEDULLARY (IM) NAIL INTERTROCHANTERIC;  Surgeon: Signa Kell, MD;  Location: [facility] ORS;  Service: Orthopedics;  Laterality: Right;   JOINT REPLACEMENT Bilateral 2014   Bilateral Knee replacement   LEAD REVISION/REPAIR N/A 08/29/2017   Procedure: LEAD REVISION/REPAIR;  Surgeon: Regan Lemming, MD;  Location: [facility] INVASIVE CV LAB;  Service: Cardiovascular;  Laterality: N/A;   OOPHORECTOMY     PACEMAKER INSERTION Left 07/01/2015   Procedure: INSERTION PACEMAKER;  Surgeon: Marcina Millard, MD;  Location: [facility] ORS;  Service: Cardiovascular;  Laterality: Left;   PACEMAKER REVISION N/A 08/28/2017   Procedure: PACEMAKER REVISION;  Surgeon: Duke Salvia, MD;  Location: [facility] INVASIVE CV LAB;  Service: Cardiovascular;  Laterality: N/A;   REPLACEMENT TOTAL KNEE  BILATERAL     SHOULDER ARTHROSCOPY WITH ROTATOR CUFF REPAIR AND OPEN BICEPS TENODESIS Left 11/21/2019   Procedure: LEFT SHOULDER ARTHROSCOPY WITH DEBRIDEMENT, DECOMPRESSION, ROTATOR CUFF REPAIR AND BICEPS TENOLYSIS;  Surgeon: Christena Flake, MD;  Location: [facility] ORS;  Service: Orthopedics;  Laterality: Left;   SHOULDER ARTHROSCOPY WITH SUBACROMIAL DECOMPRESSION Left 2013   TEE WITHOUT CARDIOVERSION N/A 06/28/2017   Procedure: TRANSESOPHAGEAL ECHOCARDIOGRAM (TEE);  Surgeon: Lamar Blinks, MD;  Location: [facility] ORS;  Service: Cardiovascular;  Laterality: N/A;   TUBAL LIGATION       Social History:   reports that she has never smoked. She has never used smokeless tobacco. She reports that she does not drink alcohol and does not use drugs.   Family History:  Her family history includes CAD in her mother; Cancer in her father; Diabetes Mellitus II in her brother and mother; Heart attack in her mother; Stroke in her brother. There is no history of Breast cancer.   Allergies Allergies  Allergen Reactions   Fish Allergy Shortness Of Breath    All kinds of fish. Severe vomitting even with smell of fish.    Macrolides And Ketolides Other (See Comments)    Severe stomach pain (mycins)   Meperidine Shortness Of Breath, Nausea Only and Other (See Comments)    Stomach pain    Other Nausea Only, Swelling, Rash, Anaphylaxis, Diarrhea, Shortness Of Breath and Other (See Comments)    Allergen: NON-STEROIDS, bextra - hands and feet swell  Reaction:  Stomach pain   Other Reaction: Intolerance   Prednisone Anaphylaxis and Other (See Comments)    (facial swelling/redness/burning)Increases pts blood sugar  Pt states that she is allergic to all steroids.     Shellfish Allergy Anaphylaxis, Shortness Of Breath, Diarrhea, Nausea Only and Rash    Stomach pain    Sulfa Antibiotics Shortness Of Breath, Diarrhea, Nausea Only and Other (See Comments)    Reaction:  Stomach pain     Sulfacetamide Sodium Diarrhea,  Nausea Only, Other (See Comments) and Shortness Of Breath  Reaction:  Stomach pain    Telbivudine Other (See Comments)    Unknown reaction   Uloric [Febuxostat] Anaphylaxis    Locks pt's body up    Aspirin Rash and Other (See Comments)    Stomach pain (tolerates lower doses)   Celecoxib Other (See Comments)    GI bleed, weakness, and stomach pain.     Cephalexin Diarrhea, Nausea Only and Other (See Comments)    stomach pain    Codeine Diarrhea, Nausea Only and Other (See Comments)    Reaction:  Stomach pain Pt tolerates morphine    Dilaudid [Hydromorphone Hcl] Other (See Comments)    Reactions: easy to overdose - blood pressure drops really low   Erythromycin Diarrhea, Nausea Only and Other (See Comments)    Reaction:  Stomach pain    Iodinated Contrast Media Rash and Other (See Comments)    Pt states that she is unable to have because she has chronic kidney disease.     Lipitor [Atorvastatin Calcium] Nausea Only    Reaction: nausea, weakness, pass blood   Oxycodone Other (See Comments)    Reaction:  Stomach pain    Aleve [Naproxen]     Reaction: severe stomach pain   Atorvastatin Nausea Only and Other (See Comments)    Weakness    Cephalosporins Diarrhea, Nausea Only and Other (See Comments)    Other: abdominal pain  Tolerated 1st generation cephalosporin (CEFAZOLIN) on 06/02/2022 and 01/21/2023 without documented ADRs.    Doxycycline     Stomach pain    Iodine Rash   Motrin [Ibuprofen]     Reaction: severe stomach pain   Tape Other (See Comments)    Causes pts skin to tear  Pt states that she is able to use paper tape.       Valdecoxib Swelling and Other (See Comments)    Pt states that her hands and feet swell.       Home Medications  Prior to Admission medications   Medication Sig Start Date End Date Taking? Authorizing Provider  acetaminophen (TYLENOL) 325 MG tablet Take 2 tablets (650 mg total) by mouth every 6 (six) hours as needed for mild pain, moderate  pain or fever (or Fever >/= 101). 02/01/23   Wieting, Richard, MD  acetaminophen (TYLENOL) 500 MG tablet Take 2 tablets (1,000 mg total) by mouth every 8 (eight) hours. 10/17/23 10/16/24  Signa Kell, MD  allopurinol (ZYLOPRIM) 100 MG tablet Take 100 mg by mouth in the morning.    [provider]  alum & mag hydroxide-simeth (MAALOX/MYLANTA) 200-200-20 MG/5ML suspension Take 10 mLs by mouth 3 (three) times daily as needed for indigestion or heartburn.    [provider]  amiodarone (PACERONE) 200 MG tablet Take 0.5 tablets (100 mg total) by mouth daily. 07/11/23   Sherie Don, NP  apixaban (ELIQUIS) 2.5 MG TABS tablet Take 1 tablet (2.5 mg total) by mouth 2 (two) times daily. 03/06/22   Sunnie Nielsen, DO  carboxymethylcellulose (REFRESH PLUS) 0.5 % SOLN 1 drop 3 (three) times daily as needed.    [provider]  Cholecalciferol (VITAMIN D) 50 MCG (2000 UT) CAPS Take 2,000 Units by mouth in the morning.    [provider]  docusate sodium (COLACE) 100 MG capsule Take 100 mg by mouth 2 (two) times daily.    [provider]  estradiol (ESTRACE) 0.1 MG/GM vaginal cream Place 1 Applicatorful vaginally every Tuesday, Thursday, and Saturday at 6 PM.    [provider]  famotidine (PEPCID) 10 MG tablet Take 10 mg by mouth in the morning.    [provider]  Fluticasone Furoate (FLONASE SENSIMIST NA) Place 1 spray into the nose in the morning.    [provider]  folic acid (FOLVITE) 1 MG tablet Take 1 tablet (1 mg total) by mouth daily. 02/02/23   Alford Highland, MD  furosemide (LASIX) 20 MG tablet Take 1 tablet (20 mg total) by mouth daily. 02/02/23   Alford Highland, MD  gabapentin (NEURONTIN) 300 MG capsule Take 600 mg by mouth 3 (three) times daily. (0800, 1400 & 2000)    [provider]  ibandronate (BONIVA) 150 MG tablet Take 150 mg by mouth every 30 (thirty) days. Take in the morning with a full glass of water, on an  empty stomach, and do not take anything else by mouth or lie down for the next 30 min.    [provider]  Insulin Regular Human (NOVOLIN R FLEXPEN RELION) 100 UNIT/ML KwikPen Inject 0-10 Units into the skin as directed. Check blood sugar 3 times daily and inject insulin per sliding scale: Less than 70=0 units and eat a snack recheck in 15 minutes; 100-120=2 units 121-150=3 units 151-200=4 units 201-250=5 units 251-300=6 units 301-350=7 units 351-400=8 units 401-450=9 units 451-500=10 units; call MD for reading greater than 500.    [provider]  LANTUS 100 UNIT/ML injection Inject 0.09 mLs (9 Units total) into the skin at bedtime. Patient taking differently: Inject 18 Units into the skin at bedtime. 02/01/23   Alford Highland, MD  levothyroxine (SYNTHROID) 75 MCG tablet Take 37.5 mcg by mouth daily before breakfast. (0600)    [provider]  magnesium hydroxide (MILK OF MAGNESIA) 400 MG/5ML suspension Take 30 mLs by mouth daily as needed (constipation).    [provider]  midodrine (PROAMATINE) 5 MG tablet Take 5 mg by mouth 3 (three) times daily with meals. (0730, 1130 & 1600) DO NOT LIE DOWN AFTER TAKING, DO NOT TAKE AFTER EVENING MEAL. TAKE LAST DOSE OF THE DAY AT LEAST 4 HOURS BEFORE BEDTIME    [provider]  Multiple Vitamin (MULTIVITAMIN WITH MINERALS) TABS tablet Take 1 tablet by mouth daily. 02/02/23   Alford Highland, MD  nystatin cream (MYCOSTATIN) Apply 1 Application topically 2 (two) times daily as needed (rash).    [provider]  nystatin powder Apply 1 Application topically in the morning and at bedtime. (0800 & 2000) Apply topically to groin    [provider]  ondansetron (ZOFRAN-ODT) 4 MG disintegrating tablet Take 1 tablet (4 mg total) by mouth every 8 (eight) hours as needed for nausea or vomiting. 10/17/23   Signa Kell, MD  Polyethyl Glycol-Propyl Glycol 0.4-0.3 % SOLN Place 1 drop into both eyes See  admin instructions. Instill 1 drop into both eye scheduled twice daily (0800 & 2000) & may instill 1 drop into both eyes twice daily if needed for itching eyes.    [provider]  polyethylene glycol (MIRALAX / GLYCOLAX) 17 g packet Take 17 g by mouth daily. 02/01/23   Alford Highland, MD  rOPINIRole (REQUIP) 0.5 MG tablet Take 0.5 mg by mouth at bedtime. (2000)    [provider]  senna (SENOKOT) 8.6 MG tablet Take 1 tablet by mouth at bedtime.    [provider]  sertraline (ZOLOFT) 100 MG tablet Take 100 mg by mouth in the morning. (0800) 100 mg + 25 mg=125 mg    [provider]  sertraline (  ZOLOFT) 25 MG tablet Take 25 mg by mouth in the morning. (0800) 25 mg + 100 mg=125 mg    [provider]  solifenacin (VESICARE) 5 MG tablet Take 5 mg by mouth in the morning. (0800)    [provider]  thiamine (VITAMIN B-1) 100 MG tablet Take 1 tablet (100 mg total) by mouth daily. 02/02/23   Alford Highland, MD  traMADol (ULTRAM) 50 MG tablet Take 1 tablet (50 mg total) by mouth every 6 (six) hours as needed for moderate pain. Patient taking differently: Take 50 mg by mouth 2 (two) times daily as needed (pain.). 01/26/23   Dedra Skeens, PA-C  traMADol (ULTRAM) 50 MG tablet Take 1 tablet (50 mg total) by mouth every 4 (four) hours as needed for severe pain (pain score 7-10) or moderate pain (pain score 4-6). 10/17/23 10/16/24  Signa Kell, MD  vitamin B-12 (CYANOCOBALAMIN) 1000 MCG tablet Take 1,000 mcg by mouth in the morning.    [provider]   Scheduled Meds:  acetaminophen  650 mg Oral Q6H   Chlorhexidine Gluconate Cloth  6 each Topical Daily   cholecalciferol  2,000 Units Oral q AM   docusate sodium  100 mg Oral BID   famotidine  10 mg Oral q AM   gabapentin  600 mg Oral TID   insulin aspart  0-5 Units Subcutaneous QHS   insulin aspart  0-9 Units Subcutaneous TID WC   levothyroxine  37.5 mcg Oral Q0600   midodrine  5 mg Oral TID WC    multivitamin with minerals  1 tablet Oral Daily   mupirocin ointment  1 Application Nasal BID   polyethylene glycol  17 g Oral Daily   rOPINIRole  0.5 mg Oral QHS   sertraline  125 mg Oral q AM   sodium chloride flush  10-40 mL Intracatheter Q12H   thiamine  100 mg Oral Daily   Continuous Infusions:  norepinephrine (LEVOPHED) Adult infusion 6 mcg/min (10/30/23 0241)   PRN Meds:.albuterol, docusate sodium, fentaNYL (SUBLIMAZE) injection, guaiFENesin, ondansetron (ZOFRAN) IV, mouth rinse, oxyCODONE, polyethylene glycol, sodium chloride flush   Critical care time: 45 minutes      Webb Silversmith, DNP, CCRN, FNP-C, AGACNP-BC Acute Care & Family Nurse Practitioner  [facility]  See Amion for personal pager PCCM on call pager [phone] until 7 am

## 2023-10-31 DIAGNOSIS — J121 Respiratory syncytial virus pneumonia: Secondary | ICD-10-CM | POA: Diagnosis not present

## 2023-10-31 DIAGNOSIS — R578 Other shock: Secondary | ICD-10-CM | POA: Diagnosis not present

## 2023-10-31 DIAGNOSIS — N1831 Chronic kidney disease, stage 3a: Secondary | ICD-10-CM | POA: Insufficient documentation

## 2023-10-31 DIAGNOSIS — E871 Hypo-osmolality and hyponatremia: Secondary | ICD-10-CM | POA: Insufficient documentation

## 2023-10-31 DIAGNOSIS — S7001XA Contusion of right hip, initial encounter: Secondary | ICD-10-CM | POA: Diagnosis not present

## 2023-10-31 DIAGNOSIS — J9601 Acute respiratory failure with hypoxia: Secondary | ICD-10-CM | POA: Diagnosis not present

## 2023-10-31 DIAGNOSIS — E875 Hyperkalemia: Secondary | ICD-10-CM | POA: Insufficient documentation

## 2023-10-31 LAB — MAGNESIUM: Magnesium: 1.9 mg/dL (ref 1.7–2.4)

## 2023-10-31 LAB — GLUCOSE, CAPILLARY
Glucose-Capillary: 240 mg/dL — ABNORMAL HIGH (ref 70–99)
Glucose-Capillary: 225 mg/dL — ABNORMAL HIGH (ref 70–99)
Glucose-Capillary: 300 mg/dL — ABNORMAL HIGH (ref 70–99)
Glucose-Capillary: 206 mg/dL — ABNORMAL HIGH (ref 70–99)

## 2023-10-31 LAB — CBC
HCT: 26.1 % — ABNORMAL LOW (ref 36.0–46.0)
Hemoglobin: 8.2 g/dL — ABNORMAL LOW (ref 12.0–15.0)
MCH: 32 pg (ref 26.0–34.0)
MCHC: 31.4 g/dL (ref 30.0–36.0)
MCV: 102 fL — ABNORMAL HIGH (ref 80.0–100.0)
Platelets: 156 10*3/uL (ref 150–400)
RBC: 2.56 MIL/uL — ABNORMAL LOW (ref 3.87–5.11)
RDW: 14.5 % (ref 11.5–15.5)
WBC: 6.6 10*3/uL (ref 4.0–10.5)
nRBC: 0 % (ref 0.0–0.2)

## 2023-10-31 LAB — RENAL FUNCTION PANEL
Albumin: 2.9 g/dL — ABNORMAL LOW (ref 3.5–5.0)
Anion gap: 8 (ref 5–15)
BUN: 41 mg/dL — ABNORMAL HIGH (ref 8–23)
CO2: 25 mmol/L (ref 22–32)
Calcium: 8.4 mg/dL — ABNORMAL LOW (ref 8.9–10.3)
Chloride: 101 mmol/L (ref 98–111)
Creatinine, Ser: 1.31 mg/dL — ABNORMAL HIGH (ref 0.44–1.00)
GFR, Estimated: 40 mL/min — ABNORMAL LOW (ref >60)
Glucose, Bld: 260 mg/dL — ABNORMAL HIGH (ref 70–99)
Phosphorus: 2 mg/dL — ABNORMAL LOW (ref 2.5–4.6)
Potassium: 5.4 mmol/L — ABNORMAL HIGH (ref 3.5–5.1)
Sodium: 134 mmol/L — ABNORMAL LOW (ref 135–145)

## 2023-10-31 LAB — IRON AND TIBC
Iron: 11 ug/dL — ABNORMAL LOW (ref 28–170)
Saturation Ratios: 5 % — ABNORMAL LOW (ref 10.4–31.8)
TIBC: 235 ug/dL — ABNORMAL LOW (ref 250–450)
UIBC: 224 ug/dL

## 2023-10-31 LAB — POTASSIUM: Potassium: 4.9 mmol/L (ref 3.5–5.1)

## 2023-10-31 LAB — FERRITIN: Ferritin: 112 ng/mL (ref 11–307)

## 2023-10-31 MED ORDER — SODIUM ZIRCONIUM CYCLOSILICATE 5 G PO PACK
10.0000 g | PACK | Freq: Once | ORAL | Status: AC
  Administered 2023-10-31: 10 g via ORAL
  Filled 2023-10-31: qty 2

## 2023-10-31 MED ORDER — SODIUM PHOSPHATES 45 MMOLE/15ML IV SOLN
15.0000 mmol | Freq: Once | INTRAVENOUS | Status: AC
  Administered 2023-10-31: 15 mmol via INTRAVENOUS
  Filled 2023-10-31: qty 5

## 2023-10-31 NOTE — Progress Notes (Signed)
Pt transported to 1A. All patient belongings kept at bedside sent with patient's son. Pt AxOx4. On RA. VSS.

## 2023-10-31 NOTE — Plan of Care (Signed)
  Problem: Coping: Goal: Ability to adjust to condition or change in health will improve Outcome: Progressing   Problem: Fluid Volume: Goal: Ability to maintain a balanced intake and output will improve Outcome: Progressing   Problem: Nutritional: Goal: Maintenance of adequate nutrition will improve Outcome: Progressing   Problem: Skin Integrity: Goal: Risk for impaired skin integrity will decrease Outcome: Progressing   Problem: Tissue Perfusion: Goal: Adequacy of tissue perfusion will improve Outcome: Progressing   Problem: Education: Goal: Knowledge of General Education information will improve Description: Including pain rating scale, medication(s)/side effects and non-pharmacologic comfort measures Outcome: Progressing   Problem: Health Behavior/Discharge Planning: Goal: Ability to manage health-related needs will improve Outcome: Progressing   Problem: Clinical Measurements: Goal: Ability to maintain clinical measurements within normal limits will improve Outcome: Progressing Goal: Will remain free from infection Outcome: Progressing Goal: Diagnostic test results will improve Outcome: Progressing Goal: Respiratory complications will improve Outcome: Progressing Goal: Cardiovascular complication will be avoided Outcome: Progressing   Problem: Activity: Goal: Risk for activity intolerance will decrease Outcome: Progressing   Problem: Nutrition: Goal: Adequate nutrition will be maintained Outcome: Progressing   Problem: Coping: Goal: Level of anxiety will decrease Outcome: Progressing   Problem: Elimination: Goal: Will not experience complications related to bowel motility Outcome: Progressing Goal: Will not experience complications related to urinary retention Outcome: Progressing   Problem: Pain Managment: Goal: General experience of comfort will improve and/or be controlled Outcome: Progressing   Problem: Safety: Goal: Ability to remain free from  injury will improve Outcome: Progressing   Problem: Skin Integrity: Goal: Risk for impaired skin integrity will decrease Outcome: Progressing

## 2023-10-31 NOTE — Hospital Course (Addendum)
 This is an 83 yo female who presented to [facility] ER on 02/14 via EMS from [facility] with c/o right hip and right sided abdominal pain.  She recently presented to [facility] ER on 02/5 with postoperative bleeding following an outpatient right hip removal of nail hardware with screw exchange on 02/4.  She resumed her outpatient apixaban post procedure.  During that ER presentation CT Right Hip revealed intramuscular hematoma.  EDP contacted on call orthopedic surgeon Dr. Audelia Acton and he recommended hip spica Ace wrap pressure dressing, continuing apixaban to prevent blood clots.  Pt discharged back to [facility].  However, she later developed severe right hip pain prompting return to the ER.     ED Course  Upon arrival to the ER pt hypotensive map's 54 to 60's.  She received 1L NS bolus, however she remained hypotensive requiring peripheral levophed gtt. CT Right Hip revealed interval increase in size of the previously identified intramuscular hematoma within the anterolateral right thigh encasing the proximal right femoral shaft and centered within the vastus intermedius.   Patient was also found to have RSV positive with acute hypoxemic respiratory failure.  Patient is treated with steroids, bronchodilator, condition had improved, off oxygen.  Medically stable for discharge.

## 2023-10-31 NOTE — Inpatient Diabetes Management (Signed)
Inpatient Diabetes Program Recommendations  AACE/ADA: New Consensus Statement on Inpatient Glycemic Control   Target Ranges:  Prepandial:   less than 140 mg/dL      Peak postprandial:   less than 180 mg/dL (1-2 hours)      Critically ill patients:  140 - 180 mg/dL    Latest Reference Range & Units 10/30/23 07:49 10/30/23 11:25 10/30/23 15:49 10/30/23 20:58 10/31/23 07:26  Glucose-Capillary 70 - 99 mg/dL 161 (H) 096 (H) 045 (H) 192 (H) 240 (H)   Review of Glycemic Control  Diabetes history: DM2 Outpatient Diabetes medications: Lantus 19 units at bedtime, Novolin Regular 0-10 units TID with meals Current orders for Inpatient glycemic control: Semglee 10 units daily, Novolog 0-9 units TID with meals, Novolog 0-5 units QHS   Inpatient Diabetes Program Recommendations:     Insulin: Please consider increasing Semglee to 13 units daily and adding Novolog 2 units TID with meals for meal coverage if patient eats at least 50% of meals.  Thanks, Orlando Penner, RN, MSN, CDCES Diabetes Coordinator Inpatient Diabetes Program [phone] (Team Pager from 8am to 5pm)

## 2023-10-31 NOTE — Progress Notes (Signed)
Progress Note   Patient: Deborah Obrien:[id number] DOB: [birth year] DOA: 10/27/2023     3 DOS: the patient was seen and examined on 10/31/2023   Brief hospital course: This is an 83 yo female who presented to [facility] ER on 02/14 via EMS from [facility] with c/o right hip and right sided abdominal pain.  She recently presented to [facility] ER on 02/5 with postoperative bleeding following an outpatient right hip removal of nail hardware with screw exchange on 02/4.  She resumed her outpatient apixaban post procedure.  During that ER presentation CT Right Hip revealed intramuscular hematoma.  EDP contacted on call orthopedic surgeon Dr. Audelia Acton and he recommended hip spica Ace wrap pressure dressing, continuing apixaban to prevent blood clots.  Pt discharged back to [facility]  However, she later developed severe right hip pain prompting return to the ER.     ED Course  Upon arrival to the ER pt hypotensive map's 54 to 60's.  She received 1L NS bolus, however she remained hypotensive requiring peripheral levophed gtt. CT Right Hip revealed interval increase in size of the previously identified intramuscular hematoma within the anterolateral right thigh encasing the proximal right femoral shaft and centered within the vastus intermedius.   Patient was also found to have RSV positive with acute hypoxemic respiratory failure.   Principal Problem:   Hematoma of right hip Active Problems:   Hypotension   Acute respiratory failure with hypoxia (HCC)   Hemorrhagic shock (HCC)   RSV (respiratory syncytial virus pneumonia)   CKD stage 3a, GFR 45-59 ml/min (HCC)   Hyperkalemia   Hyponatremia   Hypophosphatemia   Assessment and Plan: #Right hip intramuscular hematoma secondary to anticoagulation post right hip removal of nail hardware with screw exchange on 02/4 #Hemorrhagic shock  Acute blood loss anemia. Patient developed significant hypotension required Levophed from acute blood loss  anemia.  She also received Kcentra to neutralize apixaban. Hemoglobin finally stable, continue to follow.  Check iron level. Transfer patient to regular medical floor.  Acute respiratory failure with hypoxemia. Acute bronchitis secondary to RSV. Patient also had significant hypoxemia at time of admission, this is due to bronchitis with RSV.  Did not see a pneumonia. Condition had improved, patient off oxygen.  Chronic kidney disease stage IIIa. Acute kidney injury ruled out Hyponatremia. Hyperkalemia. Hypophosphatemia. Patient renal function is stable, no acute renal failure.  Potassium running high, will be giving Lokelma. Recheck a potassium level. Give sodium phosphate.  Chronic systolic congestive heart failure. Coronary artery disease. Permanent pacemaker. Paroxysmal atrial fibrillation. Pulmonary hypertension. Condition appears stable.  Essential hypertension. Continue monitor blood pressure.  Uncontrolled type 2 diabetes with hyperglycemia Continue long-acting and short acting insulin.  Overweight with BMI 28.31. Diet exercise.    Subjective:  Patient feels much improved today, no short of breath.  No significant pain.  Physical Exam: Vitals:   10/31/23 1100 10/31/23 1129 10/31/23 1200 10/31/23 1300  BP: 116/87  (!) 114/43 (!) 104/38  Pulse: (!) 57  60 (!) 59  Resp: (!) 23  (!) 24 17  Temp:  98.2 F (36.8 C)    TempSrc:  Oral    SpO2: 100%  93% 98%  Weight:      Height:       General exam: Appears calm and comfortable  Respiratory system: Clear to auscultation. Respiratory effort normal. Cardiovascular system: Appear regular.Marland Kitchen No JVD, murmurs, rubs, gallops or clicks. No pedal edema. Gastrointestinal system: Abdomen is nondistended, soft and nontender. No organomegaly  or masses felt. Normal bowel sounds heard. Central nervous system: Alert and oriented. No focal neurological deficits. Extremities: Symmetric 5 x 5 power. Skin: No rashes, lesions or  ulcers Psychiatry: Judgement and insight appear normal. Mood & affect appropriate.    Data Reviewed:  CT scan and lab results reviewed.  Family Communication: None  Disposition: Status is: Inpatient Remains inpatient appropriate because: Severity of disease,. Patient probably will be discharged home, she currently lives in assisted living from [facility].     Time spent: 60 minutes  Author: Marrion Coy, MD 10/31/2023 1:23 PM  For on call review [email or web address].

## 2023-10-31 NOTE — Evaluation (Signed)
Physical Therapy Evaluation Patient Details Name: Deborah Obrien MRN: [id number] DOB: [birth year] Today's Date: 10/31/2023  History of Present Illness  Patient is a 83 year old female with large hematoma about the R anterolateral thigh s/p cephalomedullary screw exchange on 10/17/23. Found to be RSV+.   Clinical Impression  Patient is agreeable to PT evaluation. She reports she is from ALF and is ambulatory with a rolling walker at baseline. She has extensive DME in place already and she is hopeful to have PT at her facility upon return.  Today the patient walked 3 bouts of 26ft with rolling walker with CGA for safety. Occasional safety cues with mobility. No shortness of breath noted with activity, however patient is fatigued with mobility. 3/10 pain reported in the right hip area. Recommend to continue PT to maximize independence and to decrease caregiver burden.       If plan is discharge home, recommend the following: Assist for transportation;Assistance with cooking/housework   Can travel by private vehicle        Equipment Recommendations None recommended by PT  Recommendations for Other Services       Functional Status Assessment Patient has had a recent decline in their functional status and demonstrates the ability to make significant improvements in function in a reasonable and predictable amount of time.     Precautions / Restrictions Precautions Precautions: Fall Restrictions Weight Bearing Restrictions Per Provider Order: No      Mobility  Bed Mobility Overal bed mobility: Needs Assistance Bed Mobility: Supine to Sit, Sit to Supine     Supine to sit: Supervision Sit to supine: Supervision        Transfers Overall transfer level: Needs assistance Equipment used: Rolling walker (2 wheels) Transfers: Sit to/from Stand Sit to Stand: Contact guard assist           General transfer comment: 3 standing bouts performed from bed. cues for safety     Ambulation/Gait Ambulation/Gait assistance: Contact guard assist Gait Distance (Feet): 25 Feet (x 3 bouts) Assistive device: Rolling walker (2 wheels) Gait Pattern/deviations: Step-to pattern, Decreased stance time - right, Antalgic, Step-through pattern Gait velocity: decreased     General Gait Details: patient walked 3 bouts with seated rest break between walking bouts. cues for safety using rolling walker. mild dizziness reported. MAP in the 70's  Stairs            Wheelchair Mobility     Tilt Bed    Modified Rankin (Stroke Patients Only)       Balance Overall balance assessment: Needs assistance Sitting-balance support: Feet supported Sitting balance-Leahy Scale: Fair     Standing balance support: Bilateral upper extremity supported Standing balance-Leahy Scale: Fair Standing balance comment: using rolling walker for support in standing                             Pertinent Vitals/Pain Pain Assessment Pain Assessment: 0-10 Pain Score: 3  Pain Location: R hip Pain Descriptors / Indicators: Discomfort Pain Intervention(s): Monitored during session, Limited activity within patient's tolerance, Repositioned    Home Living Family/patient expects to be discharged to:: Assisted living                 Home Equipment: Agricultural consultant (2 wheels);Rollator (4 wheels);Shower seat;Wheelchair - manual;Hospital bed Additional Comments: [employer]    Prior Function Prior Level of Function : Independent/Modified Independent  Mobility Comments: using rolling walker recently for ambulation. ADLs Comments: she reports Mod I with bathing/dressing. Has meals provided     Extremity/Trunk Assessment   Upper Extremity Assessment Upper Extremity Assessment: Generalized weakness    Lower Extremity Assessment Lower Extremity Assessment: Generalized weakness       Communication   Communication Communication: No apparent  difficulties    Cognition Arousal: Lethargic (initially, more alert with mobility) Behavior During Therapy: WFL for tasks assessed/performed   PT - Cognitive impairments: No apparent impairments                         Following commands: Intact       Cueing Cueing Techniques: Verbal cues     General Comments      Exercises     Assessment/Plan    PT Assessment Patient needs continued PT services  PT Problem List Decreased strength;Decreased range of motion;Decreased activity tolerance;Decreased balance;Decreased mobility;Decreased knowledge of precautions       PT Treatment Interventions DME instruction;Gait training;Functional mobility training;Therapeutic activities;Therapeutic exercise;Balance training;Patient/family education    PT Goals (Current goals can be found in the Care Plan section)  Acute Rehab PT Goals Patient Stated Goal: to go back to ALF and have PT PT Goal Formulation: With patient Time For Goal Achievement: 11/14/23 Potential to Achieve Goals: Fair    Frequency Min 1X/week     Co-evaluation               AM-PAC PT "6 Clicks" Mobility  Outcome Measure Help needed turning from your back to your side while in a flat bed without using bedrails?: A Little Help needed moving from lying on your back to sitting on the side of a flat bed without using bedrails?: A Little Help needed moving to and from a bed to a chair (including a wheelchair)?: A Little Help needed standing up from a chair using your arms (e.g., wheelchair or bedside chair)?: A Little Help needed to walk in hospital room?: A Little Help needed climbing 3-5 steps with a railing? : A Lot 6 Click Score: 17    End of Session Equipment Utilized During Treatment: Gait belt Activity Tolerance: Patient tolerated treatment well Patient left: in bed;with call bell/phone within reach;with bed alarm set;with SCD's reapplied Nurse Communication: Mobility status PT Visit  Diagnosis: Other abnormalities of gait and mobility (R26.89);Difficulty in walking, not elsewhere classified (R26.2)    Time: 6433-2951 PT Time Calculation (min) (ACUTE ONLY): 21 min   Charges:   PT Evaluation $PT Eval Moderate Complexity: 1 Mod PT Treatments $Therapeutic Activity: 8-22 mins PT General Charges $$ ACUTE PT VISIT: 1 Visit         Donna Bernard, PT, MPT  [facility] [employer] 10/31/2023, 11:30 AM

## 2023-10-31 NOTE — Consult Note (Addendum)
PHARMACY CONSULT NOTE - ELECTROLYTES  Pharmacy Consult for Electrolyte Monitoring and Replacement   Recent Labs: Height: 5\' 3"  (160 cm) Weight: 72.5 kg (159 lb 13.3 oz) IBW/kg (Calculated) : 52.4Estimated Creatinine Clearance: 31 mL/min (A) (by C-G formula based on SCr of 1.31 mg/dL (H)).  Potassium (mmol/L)  Date Value  10/31/2023 5.4 (H)  10/16/2014 4.6   Magnesium (mg/dL)  Date Value  16/06/9603 1.9   Calcium (mg/dL)  Date Value  54/05/8118 8.4 (L)   Calcium, Total (mg/dL)  Date Value  14/78/2956 8.5   Albumin (g/dL)  Date Value  21/30/8657 2.9 (L)  07/03/2023 4.4  10/29/2012 3.9   Phosphorus (mg/dL)  Date Value  84/69/6295 2.0 (L)   Sodium (mmol/L)  Date Value  10/31/2023 134 (L)  07/03/2023 137  10/16/2014 141   Assessment  Deborah Obrien is a 83 y.o. female presenting with hypoglycemia. PMH significant for CAD, HFpEF, atrial fibrillation, SA node dysfunction/SSS (s/p PPM placement). BILATERAL carotid artery disease, TIA, chronic cerebral microvascular disease, aortic atherosclerosis, HTN, HLD, T2DM, hypothyroidism, CKD-IV, pulmonary hypertension, GERD (on daily H2 blocker), gastritis, GI bleeding,  anemia, OA, cervical DDD (s/p ACDF C6-C7), chronic back pain, peripheral neuropathy, anxiety, depression. Pharmacy has been consulted to monitor and replace electrolytes.  Diet: NPO MIVF: None Pertinent medications: None  Goal of Therapy:  Electrolytes WNL:  Keep K > 4.0 and Mg > 2.0 due to history of heart disease/afib  Today, 10/31/2023 K = 5.4 Mg = 1.9 Phos = 2.0  Plan:  No replacement needed Continue to monitor K up-trending; provider ordered Lokelma 10g x 1 Continue to monitor with AM labs   Thank you for allowing pharmacy to be a part of this patient's care.  Effie Shy, PharmD Pharmacy Resident  10/31/2023 9:52 AM

## 2023-11-01 DIAGNOSIS — J121 Respiratory syncytial virus pneumonia: Secondary | ICD-10-CM | POA: Diagnosis not present

## 2023-11-01 DIAGNOSIS — R578 Other shock: Secondary | ICD-10-CM | POA: Diagnosis not present

## 2023-11-01 DIAGNOSIS — J9601 Acute respiratory failure with hypoxia: Secondary | ICD-10-CM | POA: Diagnosis not present

## 2023-11-01 LAB — BASIC METABOLIC PANEL
Anion gap: 6 (ref 5–15)
BUN: 38 mg/dL — ABNORMAL HIGH (ref 8–23)
CO2: 24 mmol/L (ref 22–32)
Calcium: 8.2 mg/dL — ABNORMAL LOW (ref 8.9–10.3)
Chloride: 106 mmol/L (ref 98–111)
Creatinine, Ser: 1.2 mg/dL — ABNORMAL HIGH (ref 0.44–1.00)
GFR, Estimated: 45 mL/min — ABNORMAL LOW (ref >60)
Glucose, Bld: 189 mg/dL — ABNORMAL HIGH (ref 70–99)
Potassium: 4.7 mmol/L (ref 3.5–5.1)
Sodium: 136 mmol/L (ref 135–145)

## 2023-11-01 LAB — CBC
HCT: 24.6 % — ABNORMAL LOW (ref 36.0–46.0)
Hemoglobin: 8 g/dL — ABNORMAL LOW (ref 12.0–15.0)
MCH: 32.5 pg (ref 26.0–34.0)
MCHC: 32.5 g/dL (ref 30.0–36.0)
MCV: 100 fL (ref 80.0–100.0)
Platelets: 175 10*3/uL (ref 150–400)
RBC: 2.46 MIL/uL — ABNORMAL LOW (ref 3.87–5.11)
RDW: 14.4 % (ref 11.5–15.5)
WBC: 6.4 10*3/uL (ref 4.0–10.5)
nRBC: 0 % (ref 0.0–0.2)

## 2023-11-01 LAB — GLUCOSE, CAPILLARY
Glucose-Capillary: 161 mg/dL — ABNORMAL HIGH (ref 70–99)
Glucose-Capillary: 300 mg/dL — ABNORMAL HIGH (ref 70–99)
Glucose-Capillary: 263 mg/dL — ABNORMAL HIGH (ref 70–99)
Glucose-Capillary: 102 mg/dL — ABNORMAL HIGH (ref 70–99)

## 2023-11-01 LAB — MAGNESIUM: Magnesium: 1.8 mg/dL (ref 1.7–2.4)

## 2023-11-01 LAB — PHOSPHORUS: Phosphorus: 2.4 mg/dL — ABNORMAL LOW (ref 2.5–4.6)

## 2023-11-01 MED ORDER — IRON SUCROSE 300 MG IVPB - SIMPLE MED
300.0000 mg | Freq: Once | Status: AC
  Administered 2023-11-01: 300 mg via INTRAVENOUS
  Filled 2023-11-01: qty 265
  Filled 2023-11-01: qty 300

## 2023-11-01 MED ORDER — INSULIN ASPART 100 UNIT/ML IJ SOLN
3.0000 [IU] | Freq: Three times a day (TID) | INTRAMUSCULAR | Status: DC
  Administered 2023-11-01 – 2023-11-04 (×10): 3 [IU] via SUBCUTANEOUS
  Filled 2023-11-01 (×10): qty 1

## 2023-11-01 MED ORDER — HYDROCOD POLI-CHLORPHE POLI ER 10-8 MG/5ML PO SUER
2.5000 mL | Freq: Two times a day (BID) | ORAL | Status: DC
  Administered 2023-11-01 – 2023-11-06 (×11): 2.5 mL via ORAL
  Filled 2023-11-01 (×11): qty 5

## 2023-11-01 MED ORDER — SODIUM PHOSPHATES 45 MMOLE/15ML IV SOLN
15.0000 mmol | Freq: Once | INTRAVENOUS | Status: DC
  Filled 2023-11-01: qty 5

## 2023-11-01 MED ORDER — INSULIN GLARGINE-YFGN 100 UNIT/ML ~~LOC~~ SOLN
14.0000 [IU] | Freq: Every day | SUBCUTANEOUS | Status: DC
  Administered 2023-11-02 – 2023-11-03 (×2): 14 [IU] via SUBCUTANEOUS
  Filled 2023-11-01 (×2): qty 0.14

## 2023-11-01 NOTE — Progress Notes (Signed)
Physical Therapy Treatment Patient Details Name: Deborah Obrien MRN: [id number] DOB: 1941-02-07 Today's Date: 11/01/2023   History of Present Illness Patient is a 83 year old female with large hematoma about the R anterolateral thigh s/p cephalomedullary screw exchange on 10/17/23. Found to be RSV+    PT Comments  Patient received in recliner, she is agreeable to PT session. Patient reports she is not feeling very well today. Stands from recliner with min A, ambulates 150 feet with RW and cga. No lob, some weakness/decreased activity tolerance noted. Patient will continue to benefit from skilled PT to improve strength and endurance.     If plan is discharge home, recommend the following: A little help with walking and/or transfers;A little help with bathing/dressing/bathroom   Can travel by private vehicle      yes  Equipment Recommendations  None recommended by PT    Recommendations for Other Services       Precautions / Restrictions Precautions Precautions: Fall Recall of Precautions/Restrictions: Intact Restrictions Weight Bearing Restrictions Per Provider Order: No     Mobility  Bed Mobility               General bed mobility comments: NT patient in recliner and returned to recliner    Transfers Overall transfer level: Needs assistance Equipment used: Rolling walker (2 wheels) Transfers: Sit to/from Stand Sit to Stand: Contact guard assist           General transfer comment: patient able to stand from recliner with sba.    Ambulation/Gait Ambulation/Gait assistance: Contact guard assist Gait Distance (Feet): 150 Feet Assistive device: Rolling walker (2 wheels) Gait Pattern/deviations: Step-through pattern, Decreased step length - right, Decreased step length - left, Trunk flexed Gait velocity: decreased     General Gait Details: ambulated out in hallway with mask donned, cga   Stairs             Wheelchair Mobility     Tilt Bed     Modified Rankin (Stroke Patients Only)       Balance Overall balance assessment: Modified Independent Sitting-balance support: Feet supported Sitting balance-Leahy Scale: Normal     Standing balance support: Bilateral upper extremity supported, During functional activity Standing balance-Leahy Scale: Good Standing balance comment: using rolling walker for support in standing                            Communication Communication Communication: No apparent difficulties  Cognition Arousal: Alert Behavior During Therapy: WFL for tasks assessed/performed   PT - Cognitive impairments: No apparent impairments                         Following commands: Intact      Cueing Cueing Techniques: Verbal cues  Exercises      General Comments        Pertinent Vitals/Pain Pain Assessment Pain Assessment: No/denies pain Pain Intervention(s): Monitored during session, Repositioned    Home Living                          Prior Function            PT Goals (current goals can now be found in the care plan section) Acute Rehab PT Goals Patient Stated Goal: to go back to ALF and have PT PT Goal Formulation: With patient Time For Goal Achievement: 11/14/23 Potential to Achieve Goals: Good  Progress towards PT goals: Progressing toward goals    Frequency    Min 1X/week      PT Plan      Co-evaluation              AM-PAC PT "6 Clicks" Mobility   Outcome Measure  Help needed turning from your back to your side while in a flat bed without using bedrails?: A Little Help needed moving from lying on your back to sitting on the side of a flat bed without using bedrails?: A Little Help needed moving to and from a bed to a chair (including a wheelchair)?: A Little Help needed standing up from a chair using your arms (e.g., wheelchair or bedside chair)?: A Little Help needed to walk in hospital room?: A Little Help needed climbing 3-5  steps with a railing? : A Little 6 Click Score: 18    End of Session   Activity Tolerance: Patient tolerated treatment well Patient left: in chair Nurse Communication: Mobility status PT Visit Diagnosis: Other abnormalities of gait and mobility (R26.89);Difficulty in walking, not elsewhere classified (R26.2);Muscle weakness (generalized) (M62.81)     Time: 5621-3086 PT Time Calculation (min) (ACUTE ONLY): 19 min  Charges:    $Gait Training: 8-22 mins PT General Charges $$ ACUTE PT VISIT: 1 Visit                     Lathaniel Legate, PT, GCS 11/01/23,3:32 PM

## 2023-11-01 NOTE — Care Management Important Message (Signed)
Important Message  Patient Details  Name: Deborah Obrien MRN: [id number] Date of Birth: [birth year]   Important Message Given:  Yes - Medicare IM     Cristela Blue, CMA 11/01/2023, 10:23 AM

## 2023-11-01 NOTE — TOC Progression Note (Signed)
Transition of Care [facility]) - Progression Note    Patient Details  Name: Deborah Obrien MRN: [id number] Date of Birth: [birth year]  Transition of Care [facility]) CM/SW Contact  Marlowe Sax, RN Phone Number: 11/01/2023, 1:45 PM  Clinical Narrative:     TOC continues to follow, IV Iron to be done, From [facility], will return to [facility], uses a RW at baseline, HH recommended, called [employer] and requested to speak to Maralyn Sago, received to the VM for the Secondary school teacher, Left a VM asking for a call back  Expected Discharge Plan: Assisted Living Barriers to Discharge: Continued Medical Work up  Expected Discharge Plan and Services       Living arrangements for the past 2 months: Assisted Living Facility                                       Social Determinants of Health (SDOH) Interventions SDOH Screenings   Food Insecurity: No Food Insecurity (10/28/2023)  Housing: Low Risk  (10/28/2023)  Transportation Needs: No Transportation Needs (10/28/2023)  Utilities: Not At Risk (10/28/2023)  Financial Resource Strain: High Risk (02/19/2019)   Received from [facility], [facility]  Physical Activity: Unknown (02/19/2019)   Received from [facility], [facility]  Social Connections: Unknown (10/28/2023)  Stress: Stress Concern Present (02/19/2019)   Received from [facility], [facility]  Tobacco Use: Low Risk  (10/17/2023)    Readmission Risk Interventions    10/30/2023    4:25 PM  Readmission Risk Prevention Plan  Transportation Screening Complete  Medication Review (RN Care Manager) Complete  PCP or Specialist appointment within 3-5 days of discharge Complete  HRI or Home Care Consult Complete  Palliative Care Screening Complete  Skilled Nursing Facility Complete

## 2023-11-01 NOTE — Inpatient Diabetes Management (Signed)
Inpatient Diabetes Program Recommendations  AACE/ADA: New Consensus Statement on Inpatient Glycemic Control   Target Ranges:  Prepandial:   less than 140 mg/dL      Peak postprandial:   less than 180 mg/dL (1-2 hours)      Critically ill patients:  140 - 180 mg/dL    Latest Reference Range & Units 10/31/23 07:26 10/31/23 11:17 10/31/23 15:26 10/31/23 20:58 11/01/23 08:00  Glucose-Capillary 70 - 99 mg/dL 161 (H) 096 (H) 045 (H) 206 (H) 161 (H)   Review of Glycemic Control  Diabetes history: DM2 Outpatient Diabetes medications: Lantus 19 units at bedtime, Novolin Regular 0-10 units TID with meals Current orders for Inpatient glycemic control: Semglee 10 units daily, Novolog 0-9 units TID with meals, Novolog 0-5 units QHS   Inpatient Diabetes Program Recommendations:     Insulin: CBGs ranged from 206-300 mg/dl on 4/09 and 811 mg/dl this morning.  Please consider increasing Semglee to 12 units daily and adding Novolog 3 units TID with meals for meal coverage if patient eats at least 50% of meals.  Thanks, Orlando Penner, RN, MSN, CDCES Diabetes Coordinator Inpatient Diabetes Program [phone] (Team Pager from 8am to 5pm)

## 2023-11-01 NOTE — Progress Notes (Signed)
Progress Note   Patient: Deborah Obrien ZOX:[id number] DOB: [birth year] DOA: 10/27/2023     4 DOS: the patient was seen and examined on 11/01/2023   Brief hospital course: This is an 83 yo female who presented to [facility] ER on 02/14 via EMS from [facility] with c/o right hip and right sided abdominal pain.  She recently presented to [facility] ER on 02/5 with postoperative bleeding following an outpatient right hip removal of nail hardware with screw exchange on 02/4.  She resumed her outpatient apixaban post procedure.  During that ER presentation CT Right Hip revealed intramuscular hematoma.  EDP contacted on call orthopedic surgeon Dr. Audelia Acton and he recommended hip spica Ace wrap pressure dressing, continuing apixaban to prevent blood clots.  Pt discharged back to [facility].  However, she later developed severe right hip pain prompting return to the ER.     ED Course  Upon arrival to the ER pt hypotensive map's 54 to 60's.  She received 1L NS bolus, however she remained hypotensive requiring peripheral levophed gtt. CT Right Hip revealed interval increase in size of the previously identified intramuscular hematoma within the anterolateral right thigh encasing the proximal right femoral shaft and centered within the vastus intermedius.   Patient was also found to have RSV positive with acute hypoxemic respiratory failure.   Principal Problem:   Hematoma of right hip Active Problems:   Hypotension   Acute respiratory failure with hypoxia (HCC)   Hemorrhagic shock (HCC)   RSV (respiratory syncytial virus pneumonia)   CKD stage 3a, GFR 45-59 ml/min (HCC)   Hyperkalemia   Hyponatremia   Hypophosphatemia   Assessment and Plan: #Right hip intramuscular hematoma secondary to anticoagulation post right hip removal of nail hardware with screw exchange on 02/4 #Hemorrhagic shock  Acute blood loss anemia. Iron deficient anemia. Patient developed significant hypotension required Levophed  from acute blood loss anemia.  She also received Kcentra to neutralize apixaban. Hemoglobin finally stable Transfer patient to regular medical floor 2/18 Hemoglobin 8.0 today, significant iron deficiency was found, give IV iron.   Acute respiratory failure with hypoxemia. Acute bronchitis secondary to RSV. Patient also had significant hypoxemia at time of admission, this is due to bronchitis with RSV.  Did not see a pneumonia. Condition had improved, patient off oxygen. Patient still has significant cough, nonproductive.  Started Tussionex.   Chronic kidney disease stage IIIa. Acute kidney injury ruled out Hyponatremia. Hyperkalemia. Hypophosphatemia. Renal function still stable, potassium normalized after giving Lokelma.  Patient also had significant hypophosphatemia, received sodium Phos.  Phosphate level today was 2.4, will recheck level tomorrow to see if additional dose as needed.   Chronic systolic congestive heart failure. Coronary artery disease. Permanent pacemaker. Paroxysmal atrial fibrillation. Pulmonary hypertension. Condition still stable, no volume overload, heart rate under control.   Essential hypertension. Continue monitor blood pressure.   Uncontrolled type 2 diabetes with hyperglycemia Increase the dose of long-acting insulin.  Add scheduled NovoLog, continue sliding scale insulin.   Overweight with BMI 28.31. Diet exercise.         Subjective:  Patient has significant cough, nonproductive.  Short of breath has improved.  Off oxygen.  Physical Exam: Vitals:   10/31/23 2057 11/01/23 0008 11/01/23 0500 11/01/23 0916  BP: (!) 118/43   (!) 113/35  Pulse: 62   61  Resp: 16   16  Temp: 98.2 F (36.8 C)   98.9 F (37.2 C)  TempSrc:      SpO2: 99% 100%  97%  Weight:   75.2 kg   Height:       General exam: Appears calm and comfortable  Respiratory system: Clear to auscultation. Respiratory effort normal. Cardiovascular system: S1 & S2 heard, RRR.  No JVD, murmurs, rubs, gallops or clicks. No pedal edema. Gastrointestinal system: Abdomen is nondistended, soft and nontender. No organomegaly or masses felt. Normal bowel sounds heard. Central nervous system: Alert and oriented. No focal neurological deficits. Extremities: Symmetric 5 x 5 power. Skin: No rashes, lesions or ulcers Psychiatry: Mood & affect appropriate.    Data Reviewed:  Lab results reviewed.  Family Communication: son updated over the phone  Disposition: Status is: Inpatient Remains inpatient appropriate because: Severity of disease.     Time spent: 50 minutes  Author: Marrion Coy, MD 11/01/2023 12:50 PM  For on call review [email or web address].

## 2023-11-01 NOTE — Plan of Care (Signed)

## 2023-11-02 DIAGNOSIS — J121 Respiratory syncytial virus pneumonia: Secondary | ICD-10-CM | POA: Diagnosis not present

## 2023-11-02 DIAGNOSIS — S7001XA Contusion of right hip, initial encounter: Secondary | ICD-10-CM | POA: Diagnosis not present

## 2023-11-02 DIAGNOSIS — R578 Other shock: Secondary | ICD-10-CM | POA: Diagnosis not present

## 2023-11-02 DIAGNOSIS — J9601 Acute respiratory failure with hypoxia: Secondary | ICD-10-CM | POA: Diagnosis not present

## 2023-11-02 LAB — CBC
HCT: 24.6 % — ABNORMAL LOW (ref 36.0–46.0)
Hemoglobin: 7.9 g/dL — ABNORMAL LOW (ref 12.0–15.0)
MCH: 32.4 pg (ref 26.0–34.0)
MCHC: 32.1 g/dL (ref 30.0–36.0)
MCV: 100.8 fL — ABNORMAL HIGH (ref 80.0–100.0)
Platelets: 197 10*3/uL (ref 150–400)
RBC: 2.44 MIL/uL — ABNORMAL LOW (ref 3.87–5.11)
RDW: 14.4 % (ref 11.5–15.5)
WBC: 6.5 10*3/uL (ref 4.0–10.5)
nRBC: 0.3 % — ABNORMAL HIGH (ref 0.0–0.2)

## 2023-11-02 LAB — MAGNESIUM: Magnesium: 1.8 mg/dL (ref 1.7–2.4)

## 2023-11-02 LAB — BASIC METABOLIC PANEL
Anion gap: 6 (ref 5–15)
BUN: 28 mg/dL — ABNORMAL HIGH (ref 8–23)
CO2: 26 mmol/L (ref 22–32)
Calcium: 8.2 mg/dL — ABNORMAL LOW (ref 8.9–10.3)
Chloride: 106 mmol/L (ref 98–111)
Creatinine, Ser: 0.96 mg/dL (ref 0.44–1.00)
GFR, Estimated: 59 mL/min — ABNORMAL LOW (ref >60)
Glucose, Bld: 84 mg/dL (ref 70–99)
Potassium: 4.8 mmol/L (ref 3.5–5.1)
Sodium: 138 mmol/L (ref 135–145)

## 2023-11-02 LAB — CULTURE, BLOOD (SINGLE): Culture: NO GROWTH

## 2023-11-02 LAB — PHOSPHORUS: Phosphorus: 2.5 mg/dL (ref 2.5–4.6)

## 2023-11-02 LAB — GLUCOSE, CAPILLARY
Glucose-Capillary: 83 mg/dL (ref 70–99)
Glucose-Capillary: 140 mg/dL — ABNORMAL HIGH (ref 70–99)
Glucose-Capillary: 154 mg/dL — ABNORMAL HIGH (ref 70–99)
Glucose-Capillary: 206 mg/dL — ABNORMAL HIGH (ref 70–99)
Glucose-Capillary: 162 mg/dL — ABNORMAL HIGH (ref 70–99)

## 2023-11-02 MED ORDER — ALBUTEROL SULFATE HFA 108 (90 BASE) MCG/ACT IN AERS: 2.0000 | INHALATION_SPRAY | RESPIRATORY_TRACT | Status: DC | PRN

## 2023-11-02 NOTE — Progress Notes (Signed)
Progress Note   Patient: Deborah Obrien ZOX:[id number] DOB: 1940/12/04 DOA: 10/27/2023     5 DOS: the patient was seen and examined on 11/02/2023   Brief hospital course: This is an 83 yo female who presented to [facility] ER on 02/14 via EMS from [facility] with c/o right hip and right sided abdominal pain.  She recently presented to [facility] ER on 02/5 with postoperative bleeding following an outpatient right hip removal of nail hardware with screw exchange on 02/4.  She resumed her outpatient apixaban post procedure.  During that ER presentation CT Right Hip revealed intramuscular hematoma.  EDP contacted on call orthopedic surgeon Dr. Audelia Acton and he recommended hip spica Ace wrap pressure dressing, continuing apixaban to prevent blood clots.  Pt discharged back to [facility].  However, she later developed severe right hip pain prompting return to the ER.     ED Course  Upon arrival to the ER pt hypotensive map's 54 to 60's.  She received 1L NS bolus, however she remained hypotensive requiring peripheral levophed gtt. CT Right Hip revealed interval increase in size of the previously identified intramuscular hematoma within the anterolateral right thigh encasing the proximal right femoral shaft and centered within the vastus intermedius.   Patient was also found to have RSV positive with acute hypoxemic respiratory failure.  Patient is treated with steroids, bronchodilator, condition had improved, off oxygen.  Medically stable for discharge.  However, [facility] could not take patient due to weather.   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 Iron deficient anemia. Patient developed significant hypotension required Levophed from acute blood loss anemia.  She also received Kcentra to neutralize apixaban. Hemoglobin finally stable Transfer patient to regular medical floor 2/18 Received IV iron for significant iron deficient anemia.  Hemoglobin has been stable, recheck hemoglobin tomorrow.   Acute respiratory failure with hypoxemia. Acute bronchitis secondary to RSV. Patient also had significant hypoxemia at time of admission, this is due to bronchitis with RSV.  Did not see a pneumonia. Condition had improved, patient off oxygen. Patient still has significant cough, nonproductive.  Started Tussionex.  I also ordered as needed albuterol to be placed on bedside.   Chronic kidney disease stage IIIa. Acute kidney injury ruled out Hyponatremia. Hyperkalemia. Hypophosphatemia. Renal function still stable, potassium normalized after giving Lokelma.  Patient also had significant hypophosphatemia, received sodium Phos.  All electrolytes including phosphate level has normalized today.   Chronic systolic congestive heart failure. Coronary artery disease. Permanent pacemaker. Paroxysmal atrial fibrillation. Pulmonary hypertension. Condition still stable, no volume overload, heart rate under control.   Essential hypertension. Continue monitor blood pressure.   Uncontrolled type 2 diabetes with hyperglycemia Increase the dose of long-acting insulin.  Add scheduled NovoLog, continue sliding scale insulin.   Overweight with BMI 28.31. Diet exercise.      Subjective:  Patient doing well today, still have a cough, nonproductive.  Not able to transport to facility due to weather.  Physical Exam: Vitals:   11/01/23 0500 11/01/23 0916 11/01/23 1548 11/02/23 0916  BP:  Marland Kitchen)  113/35 (!) 115/44 (!) 131/55  Pulse:  61 (!) 59 74  Resp:   16 16 18   Temp:  98.9 F (37.2 C) 99.1 F (37.3 C) 98.7 F (37.1 C)  TempSrc:      SpO2:  97% 100% 99%  Weight: 75.2 kg     Height:       General exam: Appears calm and comfortable  Respiratory system: Clear to auscultation. Respiratory effort normal. Cardiovascular system: S1 & S2 heard, RRR. No JVD, murmurs, rubs, gallops or clicks. No pedal edema. Gastrointestinal system: Abdomen is nondistended, soft and nontender. No organomegaly or masses felt. Normal bowel sounds heard. Central nervous system: Alert and oriented x3. No focal neurological deficits. Extremities: Symmetric 5 x 5 power. Skin: No rashes, lesions or ulcers Psychiatry: Judgement and insight appear normal. Mood & affect appropriate.    Data Reviewed:  Lab results reviewed.  Family Communication: Son updated over the phone.  Disposition: Status is: Inpatient Remains inpatient appropriate because: Unsafe discharge, no transportation.     Time spent: 35 minutes  Author: Marrion Coy, MD 11/02/2023 10:32 AM  For on call review [email or web address].

## 2023-11-02 NOTE — Progress Notes (Signed)
Physical Therapy Treatment Patient Details Name: Deborah Obrien MRN: [id number] DOB: 1941/03/22 Today's Date: 11/02/2023   History of Present Illness Patient is a 83 year old female with large hematoma about the R anterolateral thigh s/p cephalomedullary screw exchange on 10/17/23. Found to be RSV+    PT Comments  Patient in recliner on arrival and agreeable to PT treatment session. Able to stand from recliner with supervision. Complaining of R hip pain upon standing. Ambulated 200' with RW and CGA-supervision with no LOB noted. VSS on RA. Forward flexed posture throughout mobility. Discharge plan remains appropriate.     If plan is discharge home, recommend the following: A little help with walking and/or transfers;A little help with bathing/dressing/bathroom   Can travel by private vehicle        Equipment Recommendations  None recommended by PT    Recommendations for Other Services       Precautions / Restrictions Precautions Precautions: Fall Recall of Precautions/Restrictions: Intact Restrictions Weight Bearing Restrictions Per Provider Order: No     Mobility  Bed Mobility               General bed mobility comments: in recliner on arrival    Transfers Overall transfer level: Needs assistance Equipment used: Rolling Hermenegildo Clausen (2 wheels) Transfers: Sit to/from Stand Sit to Stand: Supervision                Ambulation/Gait Ambulation/Gait assistance: Contact guard assist, Supervision Gait Distance (Feet): 200 Feet Assistive device: Rolling Quiera Diffee (2 wheels) Gait Pattern/deviations: Step-through pattern, Decreased step length - right, Decreased step length - left, Trunk flexed Gait velocity: decreased         Stairs             Wheelchair Mobility     Tilt Bed    Modified Rankin (Stroke Patients Only)       Balance Overall balance assessment: Mild deficits observed, not formally tested                                           Communication Communication Communication: No apparent difficulties  Cognition Arousal: Alert Behavior During Therapy: WFL for tasks assessed/performed   PT - Cognitive impairments: No apparent impairments                         Following commands: Intact      Cueing Cueing Techniques: Verbal cues  Exercises      General Comments        Pertinent Vitals/Pain Pain Assessment Pain Assessment: Faces Faces Pain Scale: Hurts little more Pain Location: R hip Pain Descriptors / Indicators: Discomfort Pain Intervention(s): Monitored during session    Home Living                          Prior Function            PT Goals (current goals can now be found in the care plan section) Acute Rehab PT Goals Patient Stated Goal: to go back to ALF and have PT PT Goal Formulation: With patient Time For Goal Achievement: 11/14/23 Potential to Achieve Goals: Good Progress towards PT goals: Progressing toward goals    Frequency    Min 1X/week      PT Plan      Co-evaluation  AM-PAC PT "6 Clicks" Mobility   Outcome Measure  Help needed turning from your back to your side while in a flat bed without using bedrails?: A Little Help needed moving from lying on your back to sitting on the side of a flat bed without using bedrails?: A Little Help needed moving to and from a bed to a chair (including a wheelchair)?: A Little Help needed standing up from a chair using your arms (e.g., wheelchair or bedside chair)?: A Little Help needed to walk in hospital room?: A Little Help needed climbing 3-5 steps with a railing? : A Little 6 Click Score: 18    End of Session   Activity Tolerance: Patient tolerated treatment well Patient left: in chair;with call bell/phone within reach;with chair alarm set Nurse Communication: Mobility status PT Visit Diagnosis: Other abnormalities of gait and mobility (R26.89);Difficulty in walking, not  elsewhere classified (R26.2);Muscle weakness (generalized) (M62.81)     Time: 1610-9604 PT Time Calculation (min) (ACUTE ONLY): 13 min  Charges:    $Therapeutic Activity: 8-22 mins PT General Charges $$ ACUTE PT VISIT: 1 Visit                     Maylon Peppers, PT, DPT Physical Therapist - [facility]  [facility]    Travonta Gill A Hammad Finkler 11/02/2023, 3:21 PM

## 2023-11-02 NOTE — TOC Progression Note (Signed)
Transition of Care [facility]) - Progression Note    Patient Details  Name: Deborah Obrien MRN: [id number] Date of Birth: 08-25-1941  Transition of Care [facility]) CM/SW Contact  Marlowe Sax, RN Phone Number: 11/02/2023, 10:10 AM  Clinical Narrative:     [facility] concerning the patient returning,  Left a VM for the director Morene Antu   Expected Discharge Plan: Assisted Living Barriers to Discharge: Continued Medical Work up  Expected Discharge Plan and Services       Living arrangements for the past 2 months: Assisted Living Facility                                       Social Determinants of Health (SDOH) Interventions SDOH Screenings   Food Insecurity: No Food Insecurity (10/28/2023)  Housing: Low Risk  (10/28/2023)  Transportation Needs: No Transportation Needs (10/28/2023)  Utilities: Not At Risk (10/28/2023)  Financial Resource Strain: High Risk (02/19/2019)   Received from [facility], [facility]  Physical Activity: Unknown (02/19/2019)   Received from [facility], [facility]  Social Connections: Unknown (10/28/2023)  Stress: Stress Concern Present (02/19/2019)   Received from [facility], [facility]  Tobacco Use: Low Risk  (10/17/2023)    Readmission Risk Interventions    10/30/2023    4:25 PM  Readmission Risk Prevention Plan  Transportation Screening Complete  Medication Review (RN Care Manager) Complete  PCP or Specialist appointment within 3-5 days of discharge Complete  HRI or Home Care Consult Complete  Palliative Care Screening Complete  Skilled Nursing Facility Complete

## 2023-11-03 DIAGNOSIS — S7001XA Contusion of right hip, initial encounter: Secondary | ICD-10-CM | POA: Diagnosis not present

## 2023-11-03 DIAGNOSIS — J9601 Acute respiratory failure with hypoxia: Secondary | ICD-10-CM | POA: Diagnosis not present

## 2023-11-03 DIAGNOSIS — J121 Respiratory syncytial virus pneumonia: Secondary | ICD-10-CM | POA: Diagnosis not present

## 2023-11-03 LAB — GLUCOSE, CAPILLARY
Glucose-Capillary: 94 mg/dL (ref 70–99)
Glucose-Capillary: 143 mg/dL — ABNORMAL HIGH (ref 70–99)
Glucose-Capillary: 145 mg/dL — ABNORMAL HIGH (ref 70–99)
Glucose-Capillary: 159 mg/dL — ABNORMAL HIGH (ref 70–99)

## 2023-11-03 LAB — HEMOGLOBIN: Hemoglobin: 8.5 g/dL — ABNORMAL LOW (ref 12.0–15.0)

## 2023-11-03 MED ORDER — POLYSACCHARIDE IRON COMPLEX 150 MG PO CAPS
150.0000 mg | ORAL_CAPSULE | Freq: Every day | ORAL | Status: DC
  Administered 2023-11-03 – 2023-11-06 (×4): 150 mg via ORAL
  Filled 2023-11-03 (×4): qty 1

## 2023-11-03 MED ORDER — INSULIN GLARGINE-YFGN 100 UNIT/ML ~~LOC~~ SOLN
12.0000 [IU] | Freq: Every day | SUBCUTANEOUS | Status: DC
  Administered 2023-11-04: 12 [IU] via SUBCUTANEOUS
  Filled 2023-11-03 (×2): qty 0.12

## 2023-11-03 NOTE — Plan of Care (Signed)
  Problem: Pain Managment: Goal: General experience of comfort will improve and/or be controlled Outcome: Progressing   Problem: Safety: Goal: Ability to remain free from injury will improve Outcome: Progressing   Problem: Skin Integrity: Goal: Risk for impaired skin integrity will decrease Outcome: Progressing

## 2023-11-03 NOTE — Plan of Care (Signed)
   Problem: Coping: Goal: Ability to adjust to condition or change in health will improve Outcome: Progressing   Problem: Fluid Volume: Goal: Ability to maintain a balanced intake and output will improve Outcome: Progressing   Problem: Health Behavior/Discharge Planning: Goal: Ability to identify and utilize available resources and services will improve Outcome: Progressing

## 2023-11-03 NOTE — Progress Notes (Signed)
Progress Note   Patient: Deborah Obrien [id number] DOB: Mar 12, 1941 DOA: 10/27/2023     6 DOS: the patient was seen and examined on 11/03/2023   Brief hospital course: This is an 83 yo female who presented to [facility] ER on 02/14 via EMS from [facility] with c/o right hip and right sided abdominal pain.  She recently presented to [facility] ER on 02/5 with postoperative bleeding following an outpatient right hip removal of nail hardware with screw exchange on 02/4.  She resumed her outpatient apixaban post procedure.  During that ER presentation CT Right Hip revealed intramuscular hematoma.  EDP contacted on call orthopedic surgeon Dr. Audelia Acton and he recommended hip spica Ace wrap pressure dressing, continuing apixaban to prevent blood clots.  Pt discharged back to [facility].  However, she later developed severe right hip pain prompting return to the ER.     ED Course  Upon arrival to the ER pt hypotensive map's 54 to 60's.  She received 1L NS bolus, however she remained hypotensive requiring peripheral levophed gtt. CT Right Hip revealed interval increase in size of the previously identified intramuscular hematoma within the anterolateral right thigh encasing the proximal right femoral shaft and centered within the vastus intermedius.   Patient was also found to have RSV positive with acute hypoxemic respiratory failure.  Patient is treated with steroids, bronchodilator, condition had improved, off oxygen.  Medically stable for discharge.  However, [facility] could not take patient due to weather.   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 Iron deficient anemia. Patient developed significant hypotension required Levophed from acute blood loss anemia.  She also received Kcentra to neutralize apixaban. Hemoglobin finally stable Transfer patient to regular medical floor 2/18 Received IV iron for significant iron deficient anemia.  Hemoglobin improving.  Add oral iron supplement.   Acute respiratory failure with hypoxemia. Acute bronchitis secondary to RSV. Patient also had significant hypoxemia at time of admission, this is due to bronchitis with RSV.  Did not see a pneumonia. Condition had improved, patient off oxygen. Patient still has significant cough, nonproductive.  Started Tussionex.  I also ordered as needed albuterol to be placed on bedside.   Chronic kidney disease stage IIIa. Acute kidney injury ruled out Hyponatremia. Hyperkalemia. Hypophosphatemia. Renal function still stable, potassium normalized after giving Lokelma.  Patient also had significant hypophosphatemia, received sodium Phos.  All electrolytes including phosphate level has normalized today.   Chronic systolic congestive heart failure. Coronary artery disease. Permanent pacemaker. Paroxysmal atrial fibrillation. Pulmonary hypertension. Condition still stable, no volume overload, heart rate under control.   Essential hypertension. Continue monitor blood pressure.   Uncontrolled type 2 diabetes with hyperglycemia Continue Semglee, NovoLog and sliding scale insulin.   Overweight with BMI 28.31. Diet exercise.     Patient has been medically stable for discharge, [facility] refused to accept the patient, they have to come to the hospital to assess the patient.  We called them early in the morning, this states that they do not have people to come in until Monday.  As result, patient is stuck here.     Subjective:  Patient has no complaint  today.  Physical Exam: Vitals:   11/02/23 2129 11/03/23 0500 11/03/23 0814 11/03/23 1535  BP: (!) 118/46  138/71 (!) 140/68  Pulse: 62  70 66  Resp: 16  16 16   Temp: 98.1 F (36.7 C)  99 F (37.2 C) 98.9 F (37.2 C)  TempSrc: Oral  Oral   SpO2: 100%  100% 100%  Weight:  76 kg    Height:       General exam: Appears calm and comfortable  Respiratory system: Clear to auscultation. Respiratory effort normal. Cardiovascular system: S1 & S2 heard, RRR. No JVD, murmurs, rubs, gallops or clicks. No pedal edema. Gastrointestinal system: Abdomen is nondistended, soft and nontender. No organomegaly or masses felt. Normal bowel sounds heard. Central nervous system: Alert and oriented. No focal neurological deficits. Extremities: Symmetric 5 x 5 power. Skin: No rashes, lesions or ulcers Psychiatry: Judgement and insight appear normal. Mood & affect appropriate.    Data Reviewed:  Hb improving.  Family Communication: Son updated over the phone.  Disposition: Status is: Inpatient Remains inpatient appropriate because: No discharge option.     Time spent: 35 minutes  Author: Marrion Coy, MD 11/03/2023 3:55 PM  For on call review [email or web address].

## 2023-11-03 NOTE — TOC Progression Note (Signed)
Transition of Care [facility] - Progression Note    Patient Details  Name: Deborah Obrien MRN: [id number] Date of Birth: 1941-06-30  Transition of Care [facility]) CM/SW Contact  Marlowe Sax, RN Phone Number: 11/03/2023, 2:37 PM  Clinical Narrative:    Attempted to reach [facility] again spoke with Irving Burton, she transferred me to the supervisor, she stated that noone will come to assess to see if they will take her back   Expected Discharge Plan: Assisted Living Barriers to Discharge: Continued Medical Work up  Expected Discharge Plan and Services       Living arrangements for the past 2 months: Assisted Living Facility                                       Social Determinants of Health (SDOH) Interventions SDOH Screenings   Food Insecurity: No Food Insecurity (10/28/2023)  Housing: Low Risk  (10/28/2023)  Transportation Needs: No Transportation Needs (10/28/2023)  Utilities: Not At Risk (10/28/2023)  Financial Resource Strain: High Risk (02/19/2019)   Received from [facility], [facility]  Physical Activity: Unknown (02/19/2019)   Received from [facility], [facility]  Social Connections: Unknown (10/28/2023)  Stress: Stress Concern Present (02/19/2019)   Received from [facility], [facility]  Tobacco Use: Low Risk  (10/17/2023)    Readmission Risk Interventions    10/30/2023    4:25 PM  Readmission Risk Prevention Plan  Transportation Screening Complete  Medication Review (RN Care Manager) Complete  PCP or Specialist appointment within 3-5 days of discharge Complete  HRI or Home Care Consult Complete  Palliative Care Screening Complete  Skilled Nursing Facility Complete

## 2023-11-04 DIAGNOSIS — R578 Other shock: Secondary | ICD-10-CM | POA: Diagnosis not present

## 2023-11-04 DIAGNOSIS — J9601 Acute respiratory failure with hypoxia: Secondary | ICD-10-CM | POA: Diagnosis not present

## 2023-11-04 DIAGNOSIS — J121 Respiratory syncytial virus pneumonia: Secondary | ICD-10-CM | POA: Diagnosis not present

## 2023-11-04 LAB — GLUCOSE, CAPILLARY
Glucose-Capillary: 85 mg/dL (ref 70–99)
Glucose-Capillary: 114 mg/dL — ABNORMAL HIGH (ref 70–99)
Glucose-Capillary: 113 mg/dL — ABNORMAL HIGH (ref 70–99)
Glucose-Capillary: 117 mg/dL — ABNORMAL HIGH (ref 70–99)
Glucose-Capillary: 62 mg/dL — ABNORMAL LOW (ref 70–99)
Glucose-Capillary: 71 mg/dL (ref 70–99)

## 2023-11-04 MED ORDER — OXYMETAZOLINE HCL 0.05 % NA SOLN
1.0000 | Freq: Two times a day (BID) | NASAL | Status: DC
  Administered 2023-11-04 – 2023-11-05 (×3): 1 via NASAL
  Filled 2023-11-04: qty 15

## 2023-11-04 NOTE — Progress Notes (Signed)
 Progress Note   Patient: Deborah Obrien [id number] DOB: April 03, 1941 DOA: 10/27/2023     7 DOS: the patient was seen and examined on 11/04/2023   Brief hospital course: This is an 83 yo female who presented to [facility] ER on 02/14 via EMS from [facility] with c/o right hip and right sided abdominal pain.  She recently presented to [facility] ER on 02/5 with postoperative bleeding following an outpatient right hip removal of nail hardware with screw exchange on 02/4.  She resumed her outpatient apixaban post procedure.  During that ER presentation CT Right Hip revealed intramuscular hematoma.  EDP contacted on call orthopedic surgeon Dr. Audelia Acton and he recommended hip spica Ace wrap pressure dressing, continuing apixaban to prevent blood clots.  Pt discharged back to [facility].  However, she later developed severe right hip pain prompting return to the ER.     ED Course  Upon arrival to the ER pt hypotensive map's 54 to 60's.  She received 1L NS bolus, however she remained hypotensive requiring peripheral levophed gtt. CT Right Hip revealed interval increase in size of the previously identified intramuscular hematoma within the anterolateral right thigh encasing the proximal right femoral shaft and centered within the vastus intermedius.   Patient was also found to have RSV positive with acute hypoxemic respiratory failure.  Patient is treated with steroids, bronchodilator, condition had improved, off oxygen.  Medically stable for discharge.  However, [facility] could not take patient due to weather.   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 Iron deficient anemia. Patient developed significant hypotension required Levophed from acute blood loss anemia.  She also received Kcentra to neutralize apixaban. Hemoglobin finally stable Transfer patient to regular medical floor 2/18 Received IV iron for significant iron deficient anemia.  Added oral iron supplement.  Hemoglobin continue to improve.   Acute respiratory failure with hypoxemia. Acute bronchitis secondary to RSV. Patient also had significant hypoxemia at time of admission, this is due to bronchitis with RSV.  Did not see a pneumonia. Condition had improved, patient off oxygen. Patient still has significant cough, nonproductive.  Started Tussionex.  I also ordered as needed albuterol to be placed on bedside. Patient still has some cough secondary to postnasal draining.  Could not tolerate steroids, ordered Afrin 3 days.   Chronic kidney disease stage IIIa. Acute kidney injury ruled out Hyponatremia. Hyperkalemia. Hypophosphatemia. Renal function still stable, potassium normalized after giving Lokelma.  Patient also had significant hypophosphatemia, received sodium Phos.  All electrolytes including phosphate level has normalized today.   Chronic systolic congestive heart failure. Coronary artery disease. Permanent pacemaker. Paroxysmal atrial fibrillation. Pulmonary hypertension. Condition still stable, no volume overload, heart rate under control.   Essential hypertension. Continue monitor blood pressure.   Uncontrolled type 2 diabetes with hyperglycemia Continue Semglee, NovoLog and sliding scale insulin.   Overweight with BMI 28.31. Diet exercise.          Subjective:  Patient has some nasal congestion and a runny nose, cough in the morning.  Physical Exam: Vitals:   11/03/23  1535 11/03/23 2120 11/04/23 0500 11/04/23 0808  BP: (!) 140/68 (!) 127/55   (!) 116/58  Pulse: 66 75  62  Resp: 16 18  17   Temp: 98.9 F (37.2 C) 100.2 F (37.9 C)  99.3 F (37.4 C)  TempSrc:  Oral  Oral  SpO2: 100% 99%  100%  Weight:   76.2 kg   Height:       General exam: Appears calm and comfortable  Respiratory system: Clear to auscultation. Respiratory effort normal. Cardiovascular system: S1 & S2 heard, RRR. No JVD, murmurs, rubs, gallops or clicks. No pedal edema. Gastrointestinal system: Abdomen is nondistended, soft and nontender. No organomegaly or masses felt. Normal bowel sounds heard. Central nervous system: Alert and oriented x3. No focal neurological deficits. Extremities: Symmetric 5 x 5 power. Skin: No rashes, lesions or ulcers Psychiatry: Judgement and insight appear normal. Mood & affect appropriate.    Data Reviewed:  No new labs.  Family Communication: None  Disposition: Status is: Inpatient Remains inpatient appropriate because: No discharge option, [facility] will not accept the patient until Monday.     Time spent: 35 minutes  Author: Marrion Coy, MD 11/04/2023 12:09 PM  For on call review [email or web address].

## 2023-11-04 NOTE — Plan of Care (Signed)

## 2023-11-05 DIAGNOSIS — J121 Respiratory syncytial virus pneumonia: Secondary | ICD-10-CM | POA: Diagnosis not present

## 2023-11-05 DIAGNOSIS — N1831 Chronic kidney disease, stage 3a: Secondary | ICD-10-CM | POA: Diagnosis not present

## 2023-11-05 DIAGNOSIS — S7001XA Contusion of right hip, initial encounter: Secondary | ICD-10-CM | POA: Diagnosis not present

## 2023-11-05 DIAGNOSIS — J9601 Acute respiratory failure with hypoxia: Secondary | ICD-10-CM | POA: Diagnosis not present

## 2023-11-05 LAB — GLUCOSE, CAPILLARY
Glucose-Capillary: 108 mg/dL — ABNORMAL HIGH (ref 70–99)
Glucose-Capillary: 183 mg/dL — ABNORMAL HIGH (ref 70–99)
Glucose-Capillary: 185 mg/dL — ABNORMAL HIGH (ref 70–99)
Glucose-Capillary: 152 mg/dL — ABNORMAL HIGH (ref 70–99)

## 2023-11-05 MED ORDER — INSULIN GLARGINE-YFGN 100 UNIT/ML ~~LOC~~ SOLN
10.0000 [IU] | Freq: Every day | SUBCUTANEOUS | Status: DC
  Administered 2023-11-05 – 2023-11-06 (×2): 10 [IU] via SUBCUTANEOUS
  Filled 2023-11-05 (×2): qty 0.1

## 2023-11-05 NOTE — Progress Notes (Signed)
 Physical Therapy Treatment Patient Details Name: Deborah Obrien MRN: [id number] DOB: 1941/06/10 Today's Date: 11/05/2023   History of Present Illness Patient is a 83 year old female with large hematoma about the R anterolateral thigh s/p cephalomedullary screw exchange on 10/17/23. Found to be RSV+    PT Comments  Pt OOB and completed x 1 lap with RW.  Progressing well with mobility despite continued malaise.  Excited to return to ALF when discharged.   If plan is discharge home, recommend the following: A little help with walking and/or transfers;A little help with bathing/dressing/bathroom   Can travel by private vehicle        Equipment Recommendations  None recommended by PT    Recommendations for Other Services       Precautions / Restrictions Precautions Precautions: Fall Recall of Precautions/Restrictions: Intact Restrictions Weight Bearing Restrictions Per Provider Order: No     Mobility  Bed Mobility Overal bed mobility: Needs Assistance Bed Mobility: Supine to Sit, Sit to Supine     Supine to sit: Modified independent (Device/Increase time) Sit to supine: Modified independent (Device/Increase time)        Transfers Overall transfer level: Needs assistance Equipment used: Rolling walker (2 wheels) Transfers: Sit to/from Stand Sit to Stand: Supervision                Ambulation/Gait Ambulation/Gait assistance: Supervision Gait Distance (Feet): 200 Feet Assistive device: Rolling walker (2 wheels) Gait Pattern/deviations: Step-through pattern, Decreased step length - right, Decreased step length - left, Trunk flexed Gait velocity: decreased     General Gait Details: ambulated out in hallway with mask donned,   Stairs             Wheelchair Mobility     Tilt Bed    Modified Rankin (Stroke Patients Only)       Balance Overall balance assessment: Mild deficits observed, not formally tested                                           Communication    Cognition Arousal: Alert Behavior During Therapy: WFL for tasks assessed/performed   PT - Cognitive impairments: No apparent impairments                                Cueing    Exercises      General Comments        Pertinent Vitals/Pain Pain Assessment Pain Assessment: No/denies pain    Home Living                          Prior Function            PT Goals (current goals can now be found in the care plan section) Progress towards PT goals: Progressing toward goals    Frequency    Min 1X/week      PT Plan      Co-evaluation              AM-PAC PT "6 Clicks" Mobility   Outcome Measure  Help needed turning from your back to your side while in a flat bed without using bedrails?: None Help needed moving from lying on your back to sitting on the side of a flat bed without using bedrails?: None Help needed moving  to and from a bed to a chair (including a wheelchair)?: None Help needed standing up from a chair using your arms (e.g., wheelchair or bedside chair)?: None Help needed to walk in hospital room?: A Little Help needed climbing 3-5 steps with a railing? : A Little 6 Click Score: 22    End of Session Equipment Utilized During Treatment: Gait belt Activity Tolerance: Patient tolerated treatment well Patient left: in bed;with call bell/phone within reach;with bed alarm set Nurse Communication: Mobility status PT Visit Diagnosis: Other abnormalities of gait and mobility (R26.89);Difficulty in walking, not elsewhere classified (R26.2);Muscle weakness (generalized) (M62.81)     Time: 1459-1510 PT Time Calculation (min) (ACUTE ONLY): 11 min  Charges:    $Gait Training: 8-22 mins PT General Charges $$ ACUTE PT VISIT: 1 Visit                   Danielle Dess, PTA 11/05/23, 3:31 PM

## 2023-11-05 NOTE — Plan of Care (Signed)

## 2023-11-05 NOTE — Plan of Care (Signed)
  Problem: Metabolic: Goal: Ability to maintain appropriate glucose levels will improve Outcome: Progressing   Problem: Nutritional: Goal: Maintenance of adequate nutrition will improve Outcome: Progressing   Problem: Skin Integrity: Goal: Risk for impaired skin integrity will decrease Outcome: Progressing   Problem: Tissue Perfusion: Goal: Adequacy of tissue perfusion will improve Outcome: Progressing   Problem: Clinical Measurements: Goal: Ability to maintain clinical measurements within normal limits will improve Outcome: Progressing

## 2023-11-05 NOTE — Progress Notes (Signed)
 Provider was sent the message to clarify if PICC line can be discontinued since patient's discharge is anticipated this Monday. Provide requested to clarify with the attending team in the morning.

## 2023-11-05 NOTE — Progress Notes (Signed)
 Progress Note   Patient: Deborah Obrien [id number] DOB: 1941/04/17 DOA: 10/27/2023     8 DOS: the patient was seen and examined on 11/05/2023   Brief hospital course: This is an 83 yo female who presented to [facility] ER on 02/14 via EMS from [facility] with c/o right hip and right sided abdominal pain.  She recently presented to [facility] ER on 02/5 with postoperative bleeding following an outpatient right hip removal of nail hardware with screw exchange on 02/4.  She resumed her outpatient apixaban post procedure.  During that ER presentation CT Right Hip revealed intramuscular hematoma.  EDP contacted on call orthopedic surgeon Dr. Audelia Acton and he recommended hip spica Ace wrap pressure dressing, continuing apixaban to prevent blood clots.  Pt discharged back to [facility].  However, she later developed severe right hip pain prompting return to the ER.     ED Course  Upon arrival to the ER pt hypotensive map's 54 to 60's.  She received 1L NS bolus, however she remained hypotensive requiring peripheral levophed gtt. CT Right Hip revealed interval increase in size of the previously identified intramuscular hematoma within the anterolateral right thigh encasing the proximal right femoral shaft and centered within the vastus intermedius.   Patient was also found to have RSV positive with acute hypoxemic respiratory failure.  Patient is treated with steroids, bronchodilator, condition had improved, off oxygen.  Medically stable for discharge.  However, [facility] could not take patient due to weather.   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 Iron deficient anemia. Patient developed significant hypotension required Levophed from acute blood loss anemia.  She also received Kcentra to neutralize apixaban. Hemoglobin finally stable Transfer patient to regular medical floor 2/18 Received IV iron for significant iron deficient anemia.  Added oral iron supplement.  Hemoglobin continue to improve.   Acute respiratory failure with hypoxemia. Acute bronchitis secondary to RSV. Patient also had significant hypoxemia at time of admission, this is due to bronchitis with RSV.  Did not see a pneumonia. Condition had improved, patient off oxygen. Patient still has significant cough, nonproductive.  Started Tussionex.  I also ordered as needed albuterol to be placed on bedside. Patient still has some cough secondary to postnasal draining.  Could not tolerate steroids, ordered Afrin 3 days.   Chronic kidney disease stage IIIa. Acute kidney injury ruled out Hyponatremia. Hyperkalemia. Hypophosphatemia. Renal function still stable, potassium normalized after giving Lokelma.  Patient also had significant hypophosphatemia, received sodium Phos.  All electrolytes including phosphate level has normalized today.   Chronic systolic congestive heart failure. Coronary artery disease. Permanent pacemaker. Paroxysmal atrial fibrillation. Pulmonary hypertension. Condition still stable, no volume overload, heart rate under control.   Essential hypertension. Continue monitor blood pressure.   Uncontrolled type 2 diabetes with hyperglycemia Hypoglycemia. Reduce the dose of long-acting insulin, discontinue scheduled NovoLog.  Continue sliding scale insulin.   Overweight with BMI 28.31. Diet exercise.   Has a PICC line, will discontinue at time of discharge.     Subjective:  Patient doing well today,  still has nasal congestion and postnasal  draining.  Physical Exam: Vitals:   11/04/23 0808 11/04/23 1533 11/04/23 2132 11/05/23 0758  BP: (!) 116/58 123/80 118/72 136/62  Pulse: 62 63 60 64  Resp: 17 16 17 18   Temp: 99.3 F (37.4 C) 99.2 F (37.3 C) 98.9 F (37.2 C) 98.3 F (36.8 C)  TempSrc: Oral Oral Oral Oral  SpO2: 100% 100% 100%   Weight:      Height:       General exam: Appears calm and comfortable  Respiratory system: Clear to auscultation. Respiratory effort normal. Cardiovascular system: S1 & S2 heard, RRR. No JVD, murmurs, rubs, gallops or clicks. No pedal edema. Gastrointestinal system: Abdomen is nondistended, soft and nontender. No organomegaly or masses felt. Normal bowel sounds heard. Central nervous system: Alert and oriented. No focal neurological deficits. Extremities: Symmetric 5 x 5 power. Skin: No rashes, lesions or ulcers Psychiatry: Judgement and insight appear normal. Mood & affect appropriate.    Data Reviewed:  Lab results reviewed.  Family Communication: None  Disposition: Status is: Inpatient Remains inpatient appropriate because: Unsafe discharge, [facility] refused to accept patient until Monday.     Time spent: 35 minutes  Author: Marrion Coy, MD 11/05/2023 1:21 PM  For on call review [email or web address].

## 2023-11-06 DIAGNOSIS — J121 Respiratory syncytial virus pneumonia: Secondary | ICD-10-CM | POA: Diagnosis not present

## 2023-11-06 DIAGNOSIS — J9601 Acute respiratory failure with hypoxia: Secondary | ICD-10-CM | POA: Diagnosis not present

## 2023-11-06 DIAGNOSIS — N1831 Chronic kidney disease, stage 3a: Secondary | ICD-10-CM | POA: Diagnosis not present

## 2023-11-06 DIAGNOSIS — S7001XA Contusion of right hip, initial encounter: Secondary | ICD-10-CM | POA: Diagnosis not present

## 2023-11-06 LAB — GLUCOSE, CAPILLARY
Glucose-Capillary: 90 mg/dL (ref 70–99)
Glucose-Capillary: 199 mg/dL — ABNORMAL HIGH (ref 70–99)

## 2023-11-06 MED ORDER — TRAMADOL HCL 50 MG PO TABS: 50.0000 mg | ORAL_TABLET | Freq: Four times a day (QID) | ORAL | 0 refills | Status: DC | PRN

## 2023-11-06 MED ORDER — POLYSACCHARIDE IRON COMPLEX 150 MG PO CAPS: 150.0000 mg | ORAL_CAPSULE | Freq: Every day | ORAL | Status: DC

## 2023-11-06 NOTE — Progress Notes (Signed)
 Mobility Specialist - Progress Note   11/06/23 1000  Mobility  Activity Ambulated with assistance in hallway;Transferred from bed to chair;Stood at bedside  Level of Assistance Standby assist, set-up cues, supervision of patient - no hands on  Assistive Device Front wheel walker  Distance Ambulated (ft) 180 ft  Activity Response Tolerated well  Mobility visit 1 Mobility     Pt lying in bed upon arrival, utilizing RA. Pt motivated and agreeable to activity. Completed bed mobility modI. STS and ambulation in hallway with supervision. No LOB. Slightly forward flex lean. Voiced mild SOB and soreness in R hip. Pt returned to recliner with needs in reach.    Filiberto Pinks Mobility Specialist 11/06/23, 10:32 AM

## 2023-11-06 NOTE — TOC Progression Note (Signed)
 Transition of Care [facility]) - Progression Note    Patient Details  Name: Deborah Obrien MRN: [id number] Date of Birth: 11/02/1940  Transition of Care [facility]) CM/SW Contact  Marlowe Sax, RN Phone Number: 11/06/2023, 1:32 PM  Clinical Narrative:    Spoke with Caryn Bee the son and he is agreeable for her to return to [place] [facility] today, I called [facility] to arrange transport they are on their way to get her   Expected Discharge Plan: Assisted Living Barriers to Discharge: Continued Medical Work up  Expected Discharge Plan and Services       Living arrangements for the past 2 months: Assisted Living Facility Expected Discharge Date: 11/06/23                                     Social Determinants of Health (SDOH) Interventions SDOH Screenings   Food Insecurity: No Food Insecurity (10/28/2023)  Housing: Low Risk  (10/28/2023)  Transportation Needs: No Transportation Needs (10/28/2023)  Utilities: Not At Risk (10/28/2023)  Financial Resource Strain: High Risk (02/19/2019)   Received from [facility], [facility]  Physical Activity: Unknown (02/19/2019)   Received from [facility], [facility]  Social Connections: Unknown (10/28/2023)  Stress: Stress Concern Present (02/19/2019)   Received from [facility], [facility]  Tobacco Use: Low Risk  (10/17/2023)    Readmission Risk Interventions    10/30/2023    4:25 PM  Readmission Risk Prevention Plan  Transportation Screening Complete  Medication Review (RN Care Manager) Complete  PCP or Specialist appointment within 3-5 days of discharge Complete  HRI or Home Care Consult Complete  Palliative Care Screening Complete  Skilled Nursing Facility Complete

## 2023-11-06 NOTE — TOC Progression Note (Signed)
 Transition of Care [facility]) - Progression Note    Patient Details  Name: Deborah Obrien MRN: [id number] Date of Birth: [birth year]  Transition [facility] Care [facility]) CM/SW Contact  Marlowe Sax, RN Phone Number: 11/06/2023, 12:20 PM  Clinical Narrative:     Spoke with renee from [facility] she stated that [name] is on the way to the hospital to assess to see if they can accept her back  Expected Discharge Plan: Assisted Living Barriers to Discharge: Continued Medical Work up  Expected Discharge Plan and Services       Living arrangements for the past 2 months: Assisted Living Facility                                       Social Determinants of Health (SDOH) Interventions SDOH Screenings   Food Insecurity: No Food Insecurity (10/28/2023)  Housing: Low Risk  (10/28/2023)  Transportation Needs: No Transportation Needs (10/28/2023)  Utilities: Not At Risk (10/28/2023)  Financial Resource Strain: High Risk (02/19/2019)   Received from [facility], [facility]  Physical Activity: Unknown (02/19/2019)   Received from [facility], [facility]  Social Connections: Unknown (10/28/2023)  Stress: Stress Concern Present (02/19/2019)   Received from [facility], [facility]  Tobacco Use: Low Risk  (10/17/2023)    Readmission Risk Interventions    10/30/2023    4:25 PM  Readmission Risk Prevention Plan  Transportation Screening Complete  Medication Review (RN Care Manager) Complete  PCP or Specialist appointment within 3-5 days of discharge Complete  HRI or Home Care Consult Complete  Palliative Care Screening Complete  Skilled Nursing Facility Complete

## 2023-11-06 NOTE — Discharge Summary (Signed)
 Physician Discharge Summary   Patient: Deborah Obrien MRN: [id number] DOB: Dec 12, 1940  Admit date:     10/27/2023  Discharge date: 11/06/23  Discharge Physician: Marrion Coy   PCP: Melonie Florida, FNP   Recommendations at discharge:   Follow-up with PCP in 1 week.  Discharge Diagnoses: Principal Problem:   Hematoma of right hip Active Problems:   Hypotension   Acute respiratory failure with hypoxia (HCC)   Hemorrhagic shock (HCC)   RSV (respiratory syncytial virus pneumonia)   CKD stage 3a, GFR 45-59 ml/min (HCC)   Hyperkalemia   Hyponatremia   Hypophosphatemia  Resolved Problems:   * No resolved hospital problems. [facility] Course: This is an 83 yo female who presented to [facility] ER on 02/14 via EMS from [facility] with c/o right hip and right sided abdominal pain.  She recently presented to [facility] ER on 02/5 with postoperative bleeding following an outpatient right hip removal of nail hardware with screw exchange on 02/4.  She resumed her outpatient apixaban post procedure.  During that ER presentation CT Right Hip revealed intramuscular hematoma.  EDP contacted on call orthopedic surgeon Dr. Audelia Acton and he recommended hip spica Ace wrap pressure dressing, continuing apixaban to prevent blood clots.  Pt discharged back to [facility].  However, she later developed severe right hip pain prompting return to the ER.     ED Course  Upon arrival to the ER pt hypotensive map's 54 to 60's.  She received 1L NS bolus, however she remained hypotensive requiring peripheral levophed gtt. CT Right Hip revealed interval increase in size of the previously identified intramuscular hematoma within the anterolateral right thigh encasing the proximal right femoral shaft and centered within the vastus intermedius.   Patient was also found to have RSV positive with acute hypoxemic respiratory failure.  Patient is treated with steroids, bronchodilator, condition had improved, off oxygen.   Medically stable for discharge.   Assessment and Plan: Right hip intramuscular hematoma secondary to anticoagulation post right hip removal of nail hardware with screw exchange on 02/4 #Hemorrhagic shock  Acute blood loss anemia. Iron deficient anemia. Patient developed significant hypotension required Levophed from acute blood loss anemia.  She also received Kcentra to neutralize apixaban. Hemoglobin finally stable Transfer patient to regular medical floor 2/18 Received IV iron for significant iron deficient anemia.  Added oral iron supplement.  Hemoglobin continue to improve.   Acute respiratory failure with hypoxemia. Acute bronchitis secondary to RSV. Patient also had significant hypoxemia at time of admission, this is due to bronchitis with RSV.  Did not see a pneumonia. Condition had improved, patient off oxygen. Patient still has significant cough, nonproductive.  Started Tussionex.  I also ordered as needed albuterol to be placed on bedside. Patient still has some cough secondary to postnasal draining.  Could not tolerate steroids, Afrin for 3 days.  Continue to follow with PCP as patient still has some upper respiratory symptoms.     Chronic kidney disease stage IIIa. Acute kidney injury ruled out Hyponatremia. Hyperkalemia. Hypophosphatemia. Renal function still stable, potassium normalized after giving Lokelma.  Patient also had significant hypophosphatemia, received sodium Phos.  All electrolytes including phosphate level has normalized today.   Chronic systolic congestive heart failure. Coronary artery disease. Permanent pacemaker. Paroxysmal atrial fibrillation. Pulmonary hypertension. Condition still stable, no volume overload, heart rate under control.   Essential hypertension. Continue monitor blood pressure.   Uncontrolled type 2 diabetes with hyperglycemia Hypoglycemia. Reduce the dose of long-acting insulin, discontinue scheduled  NovoLog.  Continue sliding  scale insulin.   Overweight with BMI 28.31. Diet exercise.        Consultants: None Procedures performed: None  Disposition: Assisted living Diet recommendation:  Discharge Diet Orders (From admission, onward)     Start     Ordered   11/06/23 0000  Diet - low sodium heart healthy        11/06/23 1253           Cardiac diet DISCHARGE MEDICATION: Allergies as of 11/06/2023       Reactions   Fish Allergy Shortness Of Breath   All kinds of fish. Severe vomitting even with smell of fish.    Macrolides And Ketolides Other (See Comments)   Severe stomach pain (mycins)   Meperidine Shortness Of Breath, Nausea Only, Other (See Comments)   Stomach pain    Other Nausea Only, Swelling, Rash, Anaphylaxis, Diarrhea, Shortness Of Breath, Other (See Comments)   Allergen: NON-STEROIDS, bextra - hands and feet swell Reaction:  Stomach pain   Other Reaction: Intolerance   Prednisone Anaphylaxis, Other (See Comments)   (facial swelling/redness/burning)Increases pts blood sugar  Pt states that she is allergic to all steroids.     Shellfish Allergy Anaphylaxis, Shortness Of Breath, Diarrhea, Nausea Only, Rash   Stomach pain    Sulfa Antibiotics Shortness Of Breath, Diarrhea, Nausea Only, Other (See Comments)   Reaction:  Stomach pain    Sulfacetamide Sodium Diarrhea, Nausea Only, Other (See Comments), Shortness Of Breath   Reaction:  Stomach pain    Telbivudine Other (See Comments)   Unknown reaction   Uloric [febuxostat] Anaphylaxis   Locks pt's body up   Aspirin Rash, Other (See Comments)   Stomach pain (tolerates lower doses)   Celecoxib Other (See Comments)   GI bleed, weakness, and stomach pain.     Cephalexin Diarrhea, Nausea Only, Other (See Comments)   stomach pain    Codeine Diarrhea, Nausea Only, Other (See Comments)   Reaction:  Stomach pain Pt tolerates morphine    Dilaudid [hydromorphone Hcl] Other (See Comments)   Reactions: easy to overdose - blood pressure  drops really low   Erythromycin Diarrhea, Nausea Only, Other (See Comments)   Reaction:  Stomach pain    Iodinated Contrast Media Rash, Other (See Comments)   Pt states that she is unable to have because she has chronic kidney disease.     Lipitor [atorvastatin Calcium] Nausea Only   Reaction: nausea, weakness, pass blood   Oxycodone Other (See Comments)   Reaction:  Stomach pain    Aleve [naproxen]    Reaction: severe stomach pain   Atorvastatin Nausea Only, Other (See Comments)   Weakness    Cephalosporins Diarrhea, Nausea Only, Other (See Comments)   Other: abdominal pain Tolerated 1st generation cephalosporin (CEFAZOLIN) on 06/02/2022 and 01/21/2023 without documented ADRs.    Doxycycline    Stomach pain    Iodine Rash   Motrin [ibuprofen]    Reaction: severe stomach pain   Tape Other (See Comments)   Causes pts skin to tear  Pt states that she is able to use paper tape.     Valdecoxib Swelling, Other (See Comments)   Pt states that her hands and feet swell.          Medication List     STOP taking these medications    cyanocobalamin 1000 MCG tablet Commonly known as: VITAMIN B12   midodrine 5 MG tablet Commonly known as: PROAMATINE  TAKE these medications    acetaminophen 325 MG tablet Commonly known as: TYLENOL Take 2 tablets (650 mg total) by mouth every 6 (six) hours as needed for mild pain, moderate pain or fever (or Fever >/= 101).   acetaminophen 500 MG tablet Commonly known as: TYLENOL Take 2 tablets (1,000 mg total) by mouth every 8 (eight) hours.   albuterol 108 (90 Base) MCG/ACT inhaler Commonly known as: VENTOLIN HFA Inhale 2 puffs into the lungs every 6 (six) hours as needed for wheezing or shortness of breath (cough).   allopurinol 100 MG tablet Commonly known as: ZYLOPRIM Take 100 mg by mouth in the morning.   alum & mag hydroxide-simeth 200-200-20 MG/5ML suspension Commonly known as: MAALOX/MYLANTA Take 10 mLs by mouth 3 (three)  times daily as needed for indigestion or heartburn.   amiodarone 200 MG tablet Commonly known as: PACERONE Take 0.5 tablets (100 mg total) by mouth daily.   apixaban 2.5 MG Tabs tablet Commonly known as: Eliquis Take 1 tablet (2.5 mg total) by mouth 2 (two) times daily.   docusate sodium 100 MG capsule Commonly known as: COLACE Take 100 mg by mouth 2 (two) times daily.   estradiol 0.1 MG/GM vaginal cream Commonly known as: ESTRACE Place 1 Applicatorful vaginally every Tuesday, Thursday, and Saturday at 6 PM.   famotidine 10 MG tablet Commonly known as: PEPCID Take 10 mg by mouth in the morning.   FLONASE SENSIMIST NA Place 1 spray into the nose in the morning.   furosemide 20 MG tablet Commonly known as: LASIX Take 1 tablet (20 mg total) by mouth daily.   gabapentin 300 MG capsule Commonly known as: NEURONTIN Take 600 mg by mouth 3 (three) times daily. (0800, 1400 & 2000)   ibandronate 150 MG tablet Commonly known as: BONIVA Take 150 mg by mouth every 30 (thirty) days. Take in the morning with a full glass of water, on an empty stomach, and do not take anything else by mouth or lie down for the next 30 min.   iron polysaccharides 150 MG capsule Commonly known as: NIFEREX Take 1 capsule (150 mg total) by mouth daily. Start taking on: November 07, 2023   Lantus 100 UNIT/ML injection Generic drug: insulin glargine Inject 0.09 mLs (9 Units total) into the skin at bedtime. What changed: how much to take   levothyroxine 75 MCG tablet Commonly known as: SYNTHROID Take 37.5 mcg by mouth daily before breakfast. (0600)   magnesium hydroxide 400 MG/5ML suspension Commonly known as: MILK OF MAGNESIA Take 30 mLs by mouth daily as needed (constipation).   multivitamin with minerals Tabs tablet Take 1 tablet by mouth daily.   NovoLIN R FlexPen ReliOn 100 UNIT/ML FlexPen Generic drug: Insulin Regular Human Inject 0-10 Units into the skin as directed. Check blood sugar 3  times daily and inject insulin per sliding scale: Less than 70=0 units and eat a snack recheck in 15 minutes; 100-120=2 units 121-150=3 units 151-200=4 units 201-250=5 units 251-300=6 units 301-350=7 units 351-400=8 units 401-450=9 units 451-500=10 units; call MD for reading greater than 500.   nystatin powder Apply 1 Application topically in the morning and at bedtime. (0800 & 2000) Apply topically to groin   nystatin cream Commonly known as: MYCOSTATIN Apply 1 Application topically 2 (two) times daily as needed (rash).   ondansetron 4 MG disintegrating tablet Commonly known as: ZOFRAN-ODT Take 1 tablet (4 mg total) by mouth every 8 (eight) hours as needed for nausea or vomiting.   polyethylene glycol  17 g packet Commonly known as: MIRALAX / GLYCOLAX Take 17 g by mouth daily.   Polyvinyl Alcohol-Povidone PF 1.4-0.6 % Soln Place 1 drop into both eyes in the morning and at bedtime.   rOPINIRole 0.5 MG tablet Commonly known as: REQUIP Take 0.5 mg by mouth at bedtime. (2000)   senna 8.6 MG tablet Commonly known as: SENOKOT Take 1 tablet by mouth at bedtime.   sertraline 100 MG tablet Commonly known as: ZOLOFT Take 100 mg by mouth in the morning. (0800) 100 mg + 25 mg=125 mg   sertraline 25 MG tablet Commonly known as: ZOLOFT Take 25 mg by mouth in the morning. (0800) 25 mg + 100 mg=125 mg   solifenacin 5 MG tablet Commonly known as: VESICARE Take 5 mg by mouth in the morning. (0800)   thiamine 100 MG tablet Commonly known as: Vitamin B-1 Take 1 tablet (100 mg total) by mouth daily.   traMADol 50 MG tablet Commonly known as: ULTRAM Take 1 tablet (50 mg total) by mouth every 6 (six) hours as needed for moderate pain (pain score 4-6). What changed:  when to take this reasons to take this   Vitamin D 50 MCG (2000 UT) Caps Take 2,000 Units by mouth in the morning.   Vitamin D2 50 MCG (2000 UT) Tabs Take 1 tablet by mouth daily.        Follow-up  Information     Melonie Florida, FNP Follow up in 1 week(s).   Specialty: Family Medicine Contact information: [address] [place] [address] [phone]                Discharge Exam: Ceasar Mons Weights   11/03/23 0500 11/04/23 0500 11/06/23 0519  Weight: 76 kg 76.2 kg 73.6 kg   General exam: Appears calm and comfortable  Respiratory system: Clear to auscultation. Respiratory effort normal. Cardiovascular system: S1 & S2 heard, RRR. No JVD, murmurs, rubs, gallops or clicks. No pedal edema. Gastrointestinal system: Abdomen is nondistended, soft and nontender. No organomegaly or masses felt. Normal bowel sounds heard. Central nervous system: Alert and oriented. No focal neurological deficits. Extremities: Symmetric 5 x 5 power. Skin: No rashes, lesions or ulcers Psychiatry: Judgement and insight appear normal. Mood & affect appropriate.    Condition at discharge: good  The results of significant diagnostics from this hospitalization (including imaging, microbiology, ancillary and laboratory) are listed below for reference.   Imaging Studies: DG Chest Port 1 View Result Date: 10/28/2023 CLINICAL DATA:  Status post PICC placement EXAM: PORTABLE CHEST 1 VIEW COMPARISON:  01/21/2023 FINDINGS: Cardiac shadow is stable. Pacing device is again noted. Postsurgical changes are seen. Right PICC is noted with the tip at the cavoatrial junction. Lungs are well aerated without focal infiltrate or effusion. No bony abnormality is noted. IMPRESSION: PICC in satisfactory position Electronically Signed   By: Alcide Clever M.D.   On: 10/28/2023 19:32   [place] EKG SITE RITE Result Date: 10/28/2023 If Site Rite image not attached, placement could not be confirmed due to current cardiac rhythm.  CT ABDOMEN PELVIS WO CONTRAST Result Date: 10/28/2023 CLINICAL DATA:  Abdominal pain, acute, nonlocalized. Right hip pain. EXAM: CT ABDOMEN AND PELVIS WITHOUT CONTRAST TECHNIQUE: Multidetector  CT imaging of the abdomen and pelvis was performed following the standard protocol without IV contrast. RADIATION DOSE REDUCTION: This exam was performed according to the departmental dose-optimization program which includes automated exposure control, adjustment of the mA and/or kV according to patient size and/or  use of iterative reconstruction technique. COMPARISON:  CT scan pelvis from 04/22/2023 and CT scan abdomen and pelvis from 03/04/2022. FINDINGS: Lower chest: There are subpleural atelectatic changes in the visualized lung bases. No overt consolidation. No pleural effusion. The heart is normal in size. No pericardial effusion. Partially seen cardiac pacemaker wires. Hepatobiliary: The liver is normal in size. Non-cirrhotic configuration. No suspicious mass. No intrahepatic bile duct dilation. There is mild prominence of the extrahepatic bile duct, most likely due to post cholecystectomy status. Gallbladder is surgically absent. Pancreas: Unremarkable. No pancreatic ductal dilatation or surrounding inflammatory changes. Spleen: Within normal limits. No focal lesion. Adrenals/Urinary Tract: Adrenal glands are unremarkable. No suspicious renal mass within the limitations of this unenhanced exam. No nephroureterolithiasis or obstructive uropathy. Urinary bladder is under distended, precluding optimal assessment. However, no large mass or stones identified. No perivesical fat stranding. Stomach/Bowel: No disproportionate dilation of the small or large bowel loops. No evidence of abnormal bowel wall thickening or inflammatory changes. The appendix is surgically absent. There are scattered diverticula mainly in the sigmoid colon, without imaging signs of diverticulitis. Vascular/Lymphatic: No ascites or pneumoperitoneum. No abdominal or pelvic lymphadenopathy, by size criteria. No aneurysmal dilation of the major abdominal arteries. There are moderate peripheral atherosclerotic vascular calcifications of the  aorta and its major branches. Reproductive: The uterus is surgically absent. No large adnexal mass. Other: The visualized soft tissues and abdominal wall are unremarkable. Redemonstration of right proximal thigh intramuscular hematoma, better evaluated on the dedicated right hip CT scan performed earlier the same day. The soft tissues and abdominal wall are otherwise unremarkable. Musculoskeletal: No suspicious osseous lesions. There are moderate multilevel degenerative changes in the visualized spine. T12 and L1 vertebroplasty changes noted. There is posterior spinal fixation of L4 through S1 with transpedicular screws and rods. Postsurgical changes of right hip ORIF noted. IMPRESSION: 1. No acute inflammatory process identified within the abdomen or pelvis. 2. Redemonstration of right proximal thigh intramuscular hematoma. Please refer to separately dictated CT scan right hip performed earlier the same day report for details. 3. Multiple other nonacute observations, as described above. Aortic Atherosclerosis (ICD10-I70.0). Electronically Signed   By: Jules Schick M.D.   On: 10/28/2023 09:25   CT Hip Right Wo Contrast Result Date: 10/28/2023 CLINICAL DATA:  Status post right hip ORIF with excessive postoperative pain. EXAM: CT OF THE RIGHT HIP WITHOUT CONTRAST TECHNIQUE: Multidetector CT imaging of the right hip was performed according to the standard protocol. Multiplanar CT image reconstructions were also generated. RADIATION DOSE REDUCTION: This exam was performed according to the departmental dose-optimization program which includes automated exposure control, adjustment of the mA and/or kV according to patient size and/or use of iterative reconstruction technique. COMPARISON:  None Available. FINDINGS: Bones/Joint/Cartilage Status post right hip ORIF with femoral fracture fragments in near anatomic alignment. No acute fracture or dislocation. Moderate right hip degenerative arthritis with joint space  narrowing and osteophyte formation. Ligaments Suboptimally assessed by CT. Muscles and Tendons The previously identified intramuscular hematoma within the anterolateral right thigh encasing the proximal right femoral shaft and centered within the vastus intermedius demonstrates interval increase in size. While difficult to exactly delineate given noncontrast technique and incompletely included, this measures at least 7.5 x 6.7 x 12.9 cm in greatest dimension. Degenerative enthesopathy is seen involving the origin of the instruments with cortical changes suggesting remote avulsion injury. Soft tissues Postsurgical changes are seen within the posterolateral right thigh with surgical skin staples partially visualized. Mild vascular calcification. IMPRESSION: 1. Status  post right hip ORIF with femoral fracture fragments in near anatomic alignment. No acute fracture or dislocation. 2. Interval increase in size of the previously identified intramuscular hematoma within the anterolateral right thigh encasing the proximal right femoral shaft and centered within the vastus intermedius. While difficult to exactly delineate given noncontrast technique and incompletely included, this measures at least 7.5 x 6.7 x 12.9 cm in greatest dimension. 3. Moderate right hip degenerative arthritis.  Recur Electronically Signed   By: Helyn Numbers M.D.   On: 10/28/2023 02:28   US Venous Img Lower Unilateral Right Result Date: 10/28/2023 CLINICAL DATA:  Right hip pain following hardware removal EXAM: RIGHT LOWER EXTREMITY VENOUS DOPPLER ULTRASOUND TECHNIQUE: Gray-scale sonography with graded compression, as well as color Doppler and duplex ultrasound were performed to evaluate the lower extremity deep venous systems from the level of the common femoral vein and including the common femoral, femoral, profunda femoral, popliteal and calf veins including the posterior tibial, peroneal and gastrocnemius veins when visible. The superficial  great saphenous vein was also interrogated. Spectral Doppler was utilized to evaluate flow at rest and with distal augmentation maneuvers in the common femoral, femoral and popliteal veins. COMPARISON:  None Available. FINDINGS: Contralateral Common Femoral Vein: Respiratory phasicity is normal and symmetric with the symptomatic side. No evidence of thrombus. Normal compressibility. Common Femoral Vein: No evidence of thrombus. Normal compressibility, respiratory phasicity and response to augmentation. Saphenofemoral Junction: No evidence of thrombus. Normal compressibility and flow on color Doppler imaging. Profunda Femoral Vein: No evidence of thrombus. Normal compressibility and flow on color Doppler imaging. Femoral Vein: No evidence of thrombus. Normal compressibility, respiratory phasicity and response to augmentation. Popliteal Vein: No evidence of thrombus. Normal compressibility, respiratory phasicity and response to augmentation. Calf Veins: No evidence of thrombus. Normal compressibility and flow on color Doppler imaging. Superficial Great Saphenous Vein: No evidence of thrombus. Normal compressibility. Venous Reflux:  None. Other Findings:  None. IMPRESSION: No evidence of deep venous thrombosis. Electronically Signed   By: Alcide Clever M.D.   On: 10/28/2023 00:31   CT Hip Right Wo Contrast Result Date: 10/18/2023 CLINICAL DATA:  Postop bleeding. Postoperative day 1 from hardware retrieval and placement of cephalomedullary lag screw and compression screw EXAM: CT OF THE RIGHT HIP WITHOUT CONTRAST TECHNIQUE: Multidetector CT imaging of the right hip was performed according to the standard protocol. Multiplanar CT image reconstructions were also generated. RADIATION DOSE REDUCTION: This exam was performed according to the departmental dose-optimization program which includes automated exposure control, adjustment of the mA and/or kV according to patient size and/or use of iterative reconstruction  technique. COMPARISON:  X-rays from 10/17/2023 and 04/22/2023. FINDINGS: Bones/Joint/Cartilage Healed femoral neck fracture with fixation hardware in situ. No evidence for acute fracture or hardware complication. Ligaments Suboptimally assessed by CT. Muscles and Tendons Intramuscular hematoma noted in the anterior compartment, mainly involving the vastus intermedius and vastus lateralis. Given unilateral imaging on this study, the extent of the hematoma is difficult to assess given the lack of a contralateral normal comparison side. However based on qualitative appearance, hematoma measures approximately 5.9 x 3.3 x 8.5 cm and is generally positioned anterior and lateral to the femur. Soft tissues Edema and gas is identified deep to the lateral skin staple lines compatible with surgery yesterday. There is generalized edema in the subcutaneous fat overlying the right hip and proximal femur compatible with recent surgery. IMPRESSION: 1. Intramuscular hematoma in the anterior compartment, mainly involving the vastus intermedius and vastus lateralis. Given unilateral  imaging on this study, the extent of the hematoma is difficult to assess given the lack of a contralateral normal comparison side. However based on qualitative appearance, hematoma measures approximately 5.9 x 3.3 x 8.5 cm and is generally positioned anterior and lateral to the femur. 2. Healed femoral neck fracture with fixation hardware in situ. No evidence for acute fracture or hardware complication. Electronically Signed   By: Kennith Center M.D.   On: 10/18/2023 05:32   DG HIP UNILAT WITH PELVIS 2-3 VIEWS RIGHT Result Date: 10/17/2023 CLINICAL DATA:  Elective surgery. Removal of nail hardware from proximal right femur. EXAM: DG HIP (WITH OR WITHOUT PELVIS) 2-3V RIGHT COMPARISON:  Right hip radiographs 04/22/2023 FINDINGS: Images were performed intraoperatively without the presence of a radiologist. On initial image, 1 of the two proximal right  femoral neck screws is no longer visualized. The other appears to be in the process of being removed. On the system images there are again two screws within the right femoral neck. Total fluoroscopy images: 3 Total fluoroscopy time: 103 seconds Total dose: Radiation Exposure Index (as provided by the fluoroscopic device): 17.17 mGy air Kerma Please see intraoperative findings for further detail. IMPRESSION: Intraoperative fluoroscopy for hardware removal. Electronically Signed   By: Neita Garnet M.D.   On: 10/17/2023 16:37   DG C-Arm 1-60 Min-No Report Result Date: 10/17/2023 Fluoroscopy was utilized by the requesting physician.  No radiographic interpretation.   DG C-Arm 1-60 Min-No Report Result Date: 10/17/2023 Fluoroscopy was utilized by the requesting physician.  No radiographic interpretation.    Microbiology: Results for orders placed or performed during the hospital encounter of 10/27/23  SARS Coronavirus 2 by RT PCR (hospital order, performed in [facility] hospital lab) *cepheid single result test* Anterior Nasal Swab     Status: None   Collection Time: 10/28/23  1:45 PM   Specimen: Anterior Nasal Swab  Result Value Ref Range Status   SARS Coronavirus 2 by RT PCR NEGATIVE NEGATIVE Final    Comment: (NOTE) SARS-CoV-2 target nucleic acids are NOT DETECTED.  The SARS-CoV-2 RNA is generally detectable in upper and lower respiratory specimens during the acute phase of infection. The lowest concentration of SARS-CoV-2 viral copies this assay can detect is 250 copies / mL. A negative result does not preclude SARS-CoV-2 infection and should not be used as the sole basis for treatment or other patient management decisions.  A negative result may occur with improper specimen collection / handling, submission of specimen other than nasopharyngeal swab, presence of viral mutation(s) within the areas targeted by this assay, and inadequate number of viral copies (<250 copies / mL). A negative  result must be combined with clinical observations, patient history, and epidemiological information.  Fact Sheet for Patients:   RoadLapTop.co.za  Fact Sheet for Healthcare Providers: http://kim-miller.com/  This test is not yet approved or  cleared by the [place] FDA and has been authorized for detection and/or diagnosis of SARS-CoV-2 by FDA under an Emergency Use Authorization (EUA).  This EUA will remain in effect (meaning this test can be used) for the duration of the COVID-19 declaration under Section 564(b)(1) of the Act, 21 U.S.C. section 360bbb-3(b)(1), unless the authorization is terminated or revoked sooner.  Performed at [facility], [address] [place], [place] [address]   Respiratory (~20 pathogens) panel by PCR     Status: Abnormal   Collection Time: 10/28/23  1:45 PM   Specimen: Nasopharyngeal Swab; Respiratory  Result Value Ref Range Status  Adenovirus NOT DETECTED NOT DETECTED Final   Coronavirus 229E NOT DETECTED NOT DETECTED Final    Comment: (NOTE) The Coronavirus on the Respiratory Panel, DOES NOT test for the novel  Coronavirus (2019 nCoV)    Coronavirus HKU1 NOT DETECTED NOT DETECTED Final   Coronavirus NL63 NOT DETECTED NOT DETECTED Final   Coronavirus OC43 NOT DETECTED NOT DETECTED Final   Metapneumovirus NOT DETECTED NOT DETECTED Final   Rhinovirus / Enterovirus NOT DETECTED NOT DETECTED Final   Influenza A NOT DETECTED NOT DETECTED Final   Influenza B NOT DETECTED NOT DETECTED Final   Parainfluenza Virus 1 NOT DETECTED NOT DETECTED Final   Parainfluenza Virus 2 NOT DETECTED NOT DETECTED Final   Parainfluenza Virus 3 NOT DETECTED NOT DETECTED Final   Parainfluenza Virus 4 NOT DETECTED NOT DETECTED Final   Respiratory Syncytial Virus DETECTED (A) NOT DETECTED Final   Bordetella pertussis NOT DETECTED NOT DETECTED Final   Bordetella Parapertussis NOT DETECTED NOT DETECTED Final    Chlamydophila pneumoniae NOT DETECTED NOT DETECTED Final   Mycoplasma pneumoniae NOT DETECTED NOT DETECTED Final    Comment: Performed at [facility], [address] [place], [place] [address]  MRSA Next Gen by PCR, Nasal     Status: Abnormal   Collection Time: 10/28/23  4:40 PM   Specimen: Nasal Mucosa; Nasal Swab  Result Value Ref Range Status   MRSA by PCR Next Gen DETECTED (A) NOT DETECTED Final    Comment: CRITICAL RESULT CALLED TO, READ BACK BY AND VERIFIED WITH: CINTHIA TE VASQUEZ @ 10/28/23 2029 AB (NOTE) The GeneXpert MRSA Assay (FDA approved for NASAL specimens only), is one component of a comprehensive MRSA colonization surveillance program. It is not intended to diagnose MRSA infection nor to guide or monitor treatment for MRSA infections. Test performance is not FDA approved in patients less than 83 years old. Performed at [facility], [address] [place], [place] [address]   Culture, blood (single) w Reflex to ID Panel     Status: None   Collection Time: 10/28/23  4:51 PM   Specimen: BLOOD  Result Value Ref Range Status   Specimen Description BLOOD BLOOD RIGHT HAND  Final   Special Requests   Final    BOTTLES DRAWN AEROBIC ONLY Blood Culture results may not be optimal due to an inadequate volume of blood received in culture bottles   Culture   Final    NO GROWTH 5 DAYS Performed at [facility], [address] [place], [place] [address]    Report Status 11/02/2023 FINAL  Final    Labs: CBC: Recent Labs  Lab 10/31/23 0441 11/01/23 0619 11/02/23 0558 11/03/23 0910  WBC 6.6 6.4 6.5  --   HGB 8.2* 8.0* 7.9* 8.5*  HCT 26.1* 24.6* 24.6*  --   MCV 102.0* 100.0 100.8*  --   PLT 156 175 197  --    Basic Metabolic Panel: Recent Labs  Lab 10/31/23 0441 10/31/23 1303 11/01/23 0619 11/02/23 0558  NA 134*  --  136 138  K 5.4* 4.9 4.7 4.8  CL 101  --  106 106  CO2 25  --  24 26  GLUCOSE 260*  --  189* 84  BUN 41*  --  38* 28*   CREATININE 1.31*  --  1.20* 0.96  CALCIUM 8.4*  --  8.2* 8.2*  MG 1.9  --  1.8 1.8  PHOS 2.0*  --  2.4* 2.5   Liver Function Tests:  Recent Labs  Lab 10/31/23 0441  ALBUMIN 2.9*   CBG: Recent Labs  Lab 11/05/23 1154 11/05/23 1626 11/05/23 2011 11/06/23 0804 11/06/23 1216  GLUCAP 183* 185* 152* 90 199*    Discharge time spent: greater than 30 minutes.  Signed: Marrion Coy, MD [facility] 11/06/2023

## 2023-11-06 NOTE — NC FL2 (Signed)
 [place] MEDICAID FL2 LEVEL OF CARE FORM     IDENTIFICATION  Patient Name: Deborah Obrien Birthdate: 1940/11/09 Sex: female Admission Date (Current Location): 10/27/2023  [facility] and [place] Number:  Chiropodist and Address:  [facility], [address], [place], [place] [address]      Provider Number: [id number]  Attending Physician Name and Address:  Marrion Coy, MD  Relative Name and Phone Number:  Aylene Acoff  Son  Emergency Contact  [phone]  [address]  [place] [address]    Current Level of Care: Hospital Recommended Level of Care: Assisted Living Facility Prior Approval Number:    Date Approved/Denied:   PASRR Number:    Discharge Plan: Other (Comment) (ALF)    Current Diagnoses: Patient Active Problem List   Diagnosis Date Noted   Hemorrhagic shock (HCC) 10/31/2023   RSV (respiratory syncytial virus pneumonia) 10/31/2023   CKD stage 3a, GFR 45-59 ml/min (HCC) 10/31/2023   Hyperkalemia 10/31/2023   Hyponatremia 10/31/2023   Hypophosphatemia 10/31/2023   Hematoma of right hip 10/28/2023   Acute respiratory failure with hypoxia (HCC) 02/01/2023   Chronic kidney disease, stage 4 (severe) (HCC) 02/01/2023   Paroxysmal atrial fibrillation (HCC) 01/24/2023   Pulmonary hypertension (HCC) 01/22/2023   Metabolic acidosis 01/22/2023   Fall 01/22/2023   Transaminitis 01/21/2023   Hypotension after procedure 01/21/2023   Shock (HCC) 01/21/2023   Closed fracture of right hip (HCC) 01/21/2023   Nausea 01/21/2023   Chronic heart failure with preserved ejection fraction (HCC) 01/01/2023   Restless leg syndrome 05/31/2022   Hypothyroidism 05/30/2022   Acute renal failure superimposed on stage 4 chronic kidney disease (HCC) 05/30/2022   Drop in hemoglobin 05/30/2022   Elevated liver function tests 05/30/2022   History of gout 05/30/2022   Humerus fracture 05/29/2022   Abnormal urinalysis 03/05/2022    Hypoglycemia 03/04/2022   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/02/2022   Anemia in chronic kidney disease 03/02/2022   Congestive heart failure (HCC) 03/02/2022   Osteoporosis 02/13/2020   Primary osteoarthritis of left shoulder 11/10/2019   Uses walker 09/27/2018   UTI (urinary tract infection) 06/14/2018   Type 2 diabetes mellitus with renal manifestations (HCC) 05/02/2018   Chronic anticoagulation 04/09/2018   Cardiac pacemaker in situ 04/09/2018   H/O TIA (transient ischemic attack) and stroke 04/09/2018   CAD (coronary artery disease) 04/09/2018   Hypotension 02/04/2018   Sinus node dysfunction (HCC) 08/24/2017   Dizziness 06/12/2017   Nephrolithiasis 02/22/2016   Diabetic neuropathy with neurologic complication (HCC) 11/30/2015   Recurrent major depressive disorder, in remission (HCC) 08/11/2015   Polyp of stomach and duodenum 07/29/2015   Gastric polyp    Gastro-esophageal reflux disease without esophagitis    Permanent atrial fibrillation (HCC) 06/29/2015   Intestinal obstruction (HCC) 02/20/2015   DDD (degenerative disc disease), lumbar 01/19/2015   Back pain 01/12/2015   Gout 01/12/2015   Intervertebral disc disorder with radiculopathy of lumbar region 09/17/2014   Lumbar radiculitis 09/17/2014   Lumbar stenosis with neurogenic claudication 09/17/2014   Neuritis or radiculitis due to rupture of lumbar intervertebral disc 09/17/2014   Atherosclerosis of both carotid arteries 07/15/2014   Hyperlipemia, mixed 07/15/2014   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   Bilateral carotid artery disease (HCC) 07/15/2014   Body mass index (BMI) of 40.0-44.9 in adult [facility]) 06/21/2014   Mixed urge and stress incontinence 06/25/2013   Nocturia 06/25/2013   Blurring of visual image 04/11/2012  Other visual disturbances 04/11/2012    Orientation RESPIRATION BLADDER Height & Weight     Self, Time, Situation, Place  Normal Continent Weight:  73.6 kg Height:  5\' 3"  (160 cm)  BEHAVIORAL SYMPTOMS/MOOD NEUROLOGICAL BOWEL NUTRITION STATUS      Continent Diet  AMBULATORY STATUS COMMUNICATION OF NEEDS Skin   Limited Assist Verbally Normal, Bruising                       Personal Care Assistance Level of Assistance  Bathing, Feeding, Dressing Bathing Assistance: Limited assistance Feeding assistance: Independent Dressing Assistance: Limited assistance     Functional Limitations Info  Sight, Hearing, Speech Sight Info: Adequate Hearing Info: Adequate Speech Info: Adequate    SPECIAL CARE FACTORS FREQUENCY                       Contractures Contractures Info: Not present    Additional Factors Info  Code Status, Allergies Code Status Info: full code Allergies Info: Fish Allergy, Macrolides And Ketolides, Meperidine, Other, Prednisone, Shellfish Allergy, Sulfa Antibiotics, Sulfacetamide Sodium, Telbivudine, Uloric (Febuxostat), Aspirin, Celecoxib, Cephalexin, Codeine, Dilaudid (Hydromorphone Hcl), Erythromycin, Iodinated Contrast Media, Lipitor (Atorvastatin Calcium), Oxycodone, Aleve (Naproxen), Atorvastatin, Cephalosporins, Doxycycline, Iodine, Motrin (Ibuprofen), Tape, Valdecoxib           Current Medications (11/06/2023):  This is the current hospital active medication list Current Facility-Administered Medications  Medication Dose Route Frequency Provider Last Rate Last Admin   albuterol (VENTOLIN HFA) 108 (90 Base) MCG/ACT inhaler 2 puff  2 puff Inhalation Q4H PRN Marrion Coy, MD       Chlorhexidine Gluconate Cloth 2 % PADS 6 each  6 each Topical Daily Ezequiel Essex, NP   6 each at 11/06/23 1114   chlorpheniramine-HYDROcodone (TUSSIONEX) 10-8 MG/5ML suspension 2.5 mL  2.5 mL Oral Q12H Marrion Coy, MD   2.5 mL at 11/06/23 1004   cholecalciferol (VITAMIN D3) 25 MCG (1000 UNIT) tablet 2,000 Units  2,000 Units Oral q AM Ezequiel Essex, NP   2,000 Units at 11/06/23 4098   docusate sodium (COLACE) capsule 100  mg  100 mg Oral BID PRN Jimmye Norman, NP   100 mg at 10/30/23 2054   docusate sodium (COLACE) capsule 100 mg  100 mg Oral BID Ezequiel Essex, NP   100 mg at 11/06/23 1004   famotidine (PEPCID) tablet 10 mg  10 mg Oral q AM Ezequiel Essex, NP   10 mg at 11/06/23 1191   feeding supplement (GLUCERNA SHAKE) (GLUCERNA SHAKE) liquid 237 mL  237 mL Oral TID BM Erin Fulling, MD   237 mL at 11/05/23 2046   gabapentin (NEURONTIN) capsule 600 mg  600 mg Oral TID Ezequiel Essex, NP   600 mg at 11/06/23 1004   guaiFENesin (ROBITUSSIN) 100 MG/5ML liquid 5 mL  5 mL Oral Q4H PRN Ezequiel Essex, NP   5 mL at 11/05/23 0553   insulin aspart (novoLOG) injection 0-5 Units  0-5 Units Subcutaneous QHS Ezequiel Essex, NP   2 Units at 10/31/23 2138   insulin aspart (novoLOG) injection 0-9 Units  0-9 Units Subcutaneous TID WC Ezequiel Essex, NP   2 Units at 11/06/23 1244   insulin glargine-yfgn (SEMGLEE) injection 10 Units  10 Units Subcutaneous Daily Marrion Coy, MD   10 Units at 11/06/23 1035   iron polysaccharides (NIFEREX) capsule 150 mg  150 mg Oral Daily Marrion Coy, MD   150 mg at  11/06/23 1004   levothyroxine (SYNTHROID) tablet 37.5 mcg  37.5 mcg Oral Q0600 Ezequiel Essex, NP   37.5 mcg at 11/06/23 9528   multivitamin with minerals tablet 1 tablet  1 tablet Oral Daily Ezequiel Essex, NP   1 tablet at 11/06/23 1006   ondansetron [facility]) injection 4 mg  4 mg Intravenous Q8H PRN Ward, Kristen N, DO   4 mg at 11/05/23 1030   Oral care mouth rinse  15 mL Mouth Rinse PRN Assaker, [place], MD       oxyCODONE (Oxy IR/ROXICODONE) immediate release tablet 5 mg  5 mg Oral Q6H PRN Ezequiel Essex, NP   5 mg at 11/05/23 2045   oxymetazoline (AFRIN) 0.05 % nasal spray 1 spray  1 spray Each Nare BID Marrion Coy, MD   1 spray at 11/05/23 2048   polyethylene glycol (MIRALAX / GLYCOLAX) packet 17 g  17 g Oral Daily PRN Jimmye Norman, NP   17 g at 10/30/23 2051   polyethylene glycol (MIRALAX / GLYCOLAX)  packet 17 g  17 g Oral Daily Ezequiel Essex, NP   17 g at 11/06/23 1008   rOPINIRole (REQUIP) tablet 0.5 mg  0.5 mg Oral QHS Ezequiel Essex, NP   0.5 mg at 11/05/23 2046   sertraline (ZOLOFT) tablet 125 mg  125 mg Oral q AM Ezequiel Essex, NP   125 mg at 11/06/23 4132   sodium chloride flush (NS) 0.9 % injection 10-40 mL  10-40 mL Intracatheter Q12H Assaker, [place], MD   10 mL at 11/06/23 1008   sodium chloride flush (NS) 0.9 % injection 10-40 mL  10-40 mL Intracatheter PRN Assaker, [place], MD       thiamine (VITAMIN B1) tablet 100 mg  100 mg Oral Daily Ezequiel Essex, NP   100 mg at 11/06/23 1004     Discharge Medications:  STOP taking these medications     cyanocobalamin 1000 MCG tablet Commonly known as: VITAMIN B12    midodrine 5 MG tablet Commonly known as: PROAMATINE           TAKE these medications     acetaminophen 325 MG tablet Commonly known as: TYLENOL Take 2 tablets (650 mg total) by mouth every 6 (six) hours as needed for mild pain, moderate pain or fever (or Fever >/= 101).    acetaminophen 500 MG tablet Commonly known as: TYLENOL Take 2 tablets (1,000 mg total) by mouth every 8 (eight) hours.    albuterol 108 (90 Base) MCG/ACT inhaler Commonly known as: VENTOLIN HFA Inhale 2 puffs into the lungs every 6 (six) hours as needed for wheezing or shortness of breath (cough).    allopurinol 100 MG tablet Commonly known as: ZYLOPRIM Take 100 mg by mouth in the morning.    alum & mag hydroxide-simeth 200-200-20 MG/5ML suspension Commonly known as: MAALOX/MYLANTA Take 10 mLs by mouth 3 (three) times daily as needed for indigestion or heartburn.    amiodarone 200 MG tablet Commonly known as: PACERONE Take 0.5 tablets (100 mg total) by mouth daily.    apixaban 2.5 MG Tabs tablet Commonly known as: Eliquis Take 1 tablet (2.5 mg total) by mouth 2 (two) times daily.    docusate sodium 100 MG capsule Commonly known as: COLACE Take 100 mg by mouth 2 (two)  times daily.    estradiol 0.1 MG/GM vaginal cream Commonly known as: ESTRACE Place 1 Applicatorful vaginally every Tuesday, Thursday, and Saturday at 6 PM.  famotidine 10 MG tablet Commonly known as: PEPCID Take 10 mg by mouth in the morning.    FLONASE SENSIMIST NA Place 1 spray into the nose in the morning.    furosemide 20 MG tablet Commonly known as: LASIX Take 1 tablet (20 mg total) by mouth daily.    gabapentin 300 MG capsule Commonly known as: NEURONTIN Take 600 mg by mouth 3 (three) times daily. (0800, 1400 & 2000)    ibandronate 150 MG tablet Commonly known as: BONIVA Take 150 mg by mouth every 30 (thirty) days. Take in the morning with a full glass of water, on an empty stomach, and do not take anything else by mouth or lie down for the next 30 min.    iron polysaccharides 150 MG capsule Commonly known as: NIFEREX Take 1 capsule (150 mg total) by mouth daily. Start taking on: November 07, 2023    Lantus 100 UNIT/ML injection Generic drug: insulin glargine Inject 0.09 mLs (9 Units total) into the skin at bedtime. What changed: how much to take    levothyroxine 75 MCG tablet Commonly known as: SYNTHROID Take 37.5 mcg by mouth daily before breakfast. (0600)    magnesium hydroxide 400 MG/5ML suspension Commonly known as: MILK OF MAGNESIA Take 30 mLs by mouth daily as needed (constipation).    multivitamin with minerals Tabs tablet Take 1 tablet by mouth daily.    NovoLIN R FlexPen ReliOn 100 UNIT/ML FlexPen Generic drug: Insulin Regular Human Inject 0-10 Units into the skin as directed. Check blood sugar 3 times daily and inject insulin per sliding scale: Less than 70=0 units and eat a snack recheck in 15 minutes; 100-120=2 units 121-150=3 units 151-200=4 units 201-250=5 units 251-300=6 units 301-350=7 units 351-400=8 units 401-450=9 units 451-500=10 units; call MD for reading greater than 500.    nystatin powder Apply 1 Application topically in  the morning and at bedtime. (0800 & 2000) Apply topically to groin    nystatin cream Commonly known as: MYCOSTATIN Apply 1 Application topically 2 (two) times daily as needed (rash).    ondansetron 4 MG disintegrating tablet Commonly known as: ZOFRAN-ODT Take 1 tablet (4 mg total) by mouth every 8 (eight) hours as needed for nausea or vomiting.    polyethylene glycol 17 g packet Commonly known as: MIRALAX / GLYCOLAX Take 17 g by mouth daily.    Polyvinyl Alcohol-Povidone PF 1.4-0.6 % Soln Place 1 drop into both eyes in the morning and at bedtime.    rOPINIRole 0.5 MG tablet Commonly known as: REQUIP Take 0.5 mg by mouth at bedtime. (2000)    senna 8.6 MG tablet Commonly known as: SENOKOT Take 1 tablet by mouth at bedtime.    sertraline 100 MG tablet Commonly known as: ZOLOFT Take 100 mg by mouth in the morning. (0800) 100 mg + 25 mg=125 mg    sertraline 25 MG tablet Commonly known as: ZOLOFT Take 25 mg by mouth in the morning. (0800) 25 mg + 100 mg=125 mg    solifenacin 5 MG tablet Commonly known as: VESICARE Take 5 mg by mouth in the morning. (0800)    thiamine 100 MG tablet Commonly known as: Vitamin B-1 Take 1 tablet (100 mg total) by mouth daily.    traMADol 50 MG tablet Commonly known as: ULTRAM Take 1 tablet (50 mg total) by mouth every 6 (six) hours as needed for moderate pain (pain score 4-6). What changed:  when to take this reasons to take this    Vitamin D  50 MCG (2000 UT) Caps Take 2,000 Units by mouth in the morning.    Vitamin D2 50 MCG (2000 UT) Tabs Take 1 tablet by mouth daily.      Relevant Imaging Results:  Relevant Lab Results:   Additional Information SSN: [id number]  Marlowe Sax, RN

## 2023-11-06 NOTE — TOC Progression Note (Signed)
 Transition of Care [facility]) - Progression Note    Patient Details  [name] MRN: [id number] Date of Birth: 1940/10/30  [facility]) CM/SW Contact  Marlowe Sax, RN Phone Number: 11/06/2023, 10:38 AM  Clinical Narrative:     Called  house and asked for admissions I was placed in The business managers voice mail and it is full, I reached out to [facility] leadership and asked for a follow up with [facility]  Expected Discharge Plan: Assisted Living Barriers to Discharge: Continued Medical Work up  Expected Discharge Plan and Services       Living arrangements for the past 2 months: Assisted Living Facility                                       Social Determinants of Health (SDOH) Interventions SDOH Screenings   Food Insecurity: No Food Insecurity (10/28/2023)  Housing: Low Risk  (10/28/2023)  Transportation Needs: No Transportation Needs (10/28/2023)  Utilities: Not At Risk (10/28/2023)  Financial Resource Strain: High Risk (02/19/2019)   Received from [facility], [facility]  Physical Activity: Unknown (02/19/2019)   Received from [facility], [facility]  Social Connections: Unknown (10/28/2023)  Stress: Stress Concern Present (02/19/2019)   Received from [facility], [facility]  Tobacco Use: Low Risk  (10/17/2023)    Readmission Risk Interventions    10/30/2023    4:25 PM  Readmission Risk Prevention Plan  Transportation Screening Complete  Medication Review (RN Care Manager) Complete  PCP or Specialist appointment within 3-5 days of discharge Complete  HRI or Home Care Consult Complete  Palliative Care Screening Complete  Skilled Nursing Facility Complete

## 2023-11-06 NOTE — Plan of Care (Signed)
  Problem: Education: Goal: Ability to describe self-care measures that may prevent or decrease complications (Diabetes Survival Skills Education) will improve Outcome: Progressing   Problem: Skin Integrity: Goal: Risk for impaired skin integrity will decrease Outcome: Progressing   Problem: Clinical Measurements: Goal: Ability to maintain clinical measurements within normal limits will improve Outcome: Progressing   Problem: Pain Managment: Goal: General experience of comfort will improve and/or be controlled Outcome: Progressing   Problem: Safety: Goal: Ability to remain free from injury will improve Outcome: Progressing

## 2023-12-11 ENCOUNTER — Ambulatory Visit (INDEPENDENT_AMBULATORY_CARE_PROVIDER_SITE_OTHER): Payer: Medicare Other

## 2023-12-11 ENCOUNTER — Ambulatory Visit: Payer: Medicare Other | Admitting: Urology

## 2023-12-11 DIAGNOSIS — I480 Paroxysmal atrial fibrillation: Secondary | ICD-10-CM | POA: Diagnosis not present

## 2023-12-13 LAB — CUP PACEART REMOTE DEVICE CHECK
Battery Remaining Longevity: 68 mo
Battery Voltage: 2.9 V
Brady Statistic AP VP Percent: 0.06 %
Brady Statistic AP VS Percent: 98.29 %
Brady Statistic AS VP Percent: 0 %
Brady Statistic AS VS Percent: 1.65 %
Brady Statistic RA Percent Paced: 98.78 %
Brady Statistic RV Percent Paced: 0.06 %
Date Time Interrogation Session: 20250331053802
Implantable Lead Connection Status: 753985
Implantable Lead Connection Status: 753985
Implantable Lead Implant Date: 20161019
Implantable Lead Implant Date: 20181217
Implantable Lead Location: 753859
Implantable Lead Location: 753860
Implantable Lead Model: 5076
Implantable Lead Model: 5076
Implantable Pulse Generator Implant Date: 20181217
Lead Channel Impedance Value: 304 Ohm
Lead Channel Impedance Value: 342 Ohm
Lead Channel Impedance Value: 380 Ohm
Lead Channel Impedance Value: 418 Ohm
Lead Channel Pacing Threshold Amplitude: 0.75 V
Lead Channel Pacing Threshold Amplitude: 0.875 V
Lead Channel Pacing Threshold Pulse Width: 0.4 ms
Lead Channel Pacing Threshold Pulse Width: 0.4 ms
Lead Channel Sensing Intrinsic Amplitude: 3.625 mV
Lead Channel Sensing Intrinsic Amplitude: 3.625 mV
Lead Channel Sensing Intrinsic Amplitude: 5.25 mV
Lead Channel Sensing Intrinsic Amplitude: 5.25 mV
Lead Channel Setting Pacing Amplitude: 2 V
Lead Channel Setting Pacing Amplitude: 2.5 V
Lead Channel Setting Pacing Pulse Width: 0.4 ms
Lead Channel Setting Sensing Sensitivity: 1.2 mV
Zone Setting Status: 755011

## 2023-12-23 ENCOUNTER — Encounter: Payer: Self-pay | Admitting: Internal Medicine

## 2024-01-08 ENCOUNTER — Inpatient Hospital Stay
Admission: EM | Admit: 2024-01-08 | Discharge: 2024-01-11 | DRG: 392 | Discharge status: Nursing Home (Includes ICF) | Source: Skilled Nursing Facility | Attending: Osteopathic Medicine | Admitting: Osteopathic Medicine

## 2024-01-08 ENCOUNTER — Emergency Department

## 2024-01-08 ENCOUNTER — Other Ambulatory Visit: Payer: Self-pay

## 2024-01-08 DIAGNOSIS — R7401 Elevation of levels of liver transaminase levels: Secondary | ICD-10-CM | POA: Diagnosis present

## 2024-01-08 DIAGNOSIS — Z91013 Allergy to seafood: Secondary | ICD-10-CM

## 2024-01-08 DIAGNOSIS — Z808 Family history of malignant neoplasm of other organs or systems: Secondary | ICD-10-CM

## 2024-01-08 DIAGNOSIS — K921 Melena: Secondary | ICD-10-CM | POA: Diagnosis not present

## 2024-01-08 DIAGNOSIS — K635 Polyp of colon: Secondary | ICD-10-CM

## 2024-01-08 DIAGNOSIS — Z881 Allergy status to other antibiotic agents status: Secondary | ICD-10-CM

## 2024-01-08 DIAGNOSIS — Z79890 Hormone replacement therapy (postmenopausal): Secondary | ICD-10-CM

## 2024-01-08 DIAGNOSIS — K625 Hemorrhage of anus and rectum: Secondary | ICD-10-CM | POA: Diagnosis present

## 2024-01-08 DIAGNOSIS — Z860100 Personal history of colon polyps, unspecified: Secondary | ICD-10-CM

## 2024-01-08 DIAGNOSIS — I130 Hypertensive heart and chronic kidney disease with heart failure and stage 1 through stage 4 chronic kidney disease, or unspecified chronic kidney disease: Secondary | ICD-10-CM | POA: Diagnosis present

## 2024-01-08 DIAGNOSIS — Z96653 Presence of artificial knee joint, bilateral: Secondary | ICD-10-CM | POA: Diagnosis present

## 2024-01-08 DIAGNOSIS — Z888 Allergy status to other drugs, medicaments and biological substances status: Secondary | ICD-10-CM

## 2024-01-08 DIAGNOSIS — Z8711 Personal history of peptic ulcer disease: Secondary | ICD-10-CM

## 2024-01-08 DIAGNOSIS — R1013 Epigastric pain: Secondary | ICD-10-CM | POA: Diagnosis present

## 2024-01-08 DIAGNOSIS — Z882 Allergy status to sulfonamides status: Secondary | ICD-10-CM

## 2024-01-08 DIAGNOSIS — Z794 Long term (current) use of insulin: Secondary | ICD-10-CM

## 2024-01-08 DIAGNOSIS — D123 Benign neoplasm of transverse colon: Secondary | ICD-10-CM | POA: Diagnosis present

## 2024-01-08 DIAGNOSIS — Z823 Family history of stroke: Secondary | ICD-10-CM

## 2024-01-08 DIAGNOSIS — I5032 Chronic diastolic (congestive) heart failure: Secondary | ICD-10-CM | POA: Diagnosis present

## 2024-01-08 DIAGNOSIS — F01B0 Vascular dementia, moderate, without behavioral disturbance, psychotic disturbance, mood disturbance, and anxiety: Secondary | ICD-10-CM | POA: Diagnosis present

## 2024-01-08 DIAGNOSIS — Z886 Allergy status to analgesic agent status: Secondary | ICD-10-CM

## 2024-01-08 DIAGNOSIS — Z8249 Family history of ischemic heart disease and other diseases of the circulatory system: Secondary | ICD-10-CM

## 2024-01-08 DIAGNOSIS — I480 Paroxysmal atrial fibrillation: Secondary | ICD-10-CM | POA: Diagnosis present

## 2024-01-08 DIAGNOSIS — Z79899 Other long term (current) drug therapy: Secondary | ICD-10-CM

## 2024-01-08 DIAGNOSIS — Z7901 Long term (current) use of anticoagulants: Secondary | ICD-10-CM

## 2024-01-08 DIAGNOSIS — K529 Noninfective gastroenteritis and colitis, unspecified: Principal | ICD-10-CM | POA: Insufficient documentation

## 2024-01-08 DIAGNOSIS — Z66 Do not resuscitate: Secondary | ICD-10-CM | POA: Diagnosis present

## 2024-01-08 DIAGNOSIS — Z980 Intestinal bypass and anastomosis status: Secondary | ICD-10-CM

## 2024-01-08 DIAGNOSIS — E782 Mixed hyperlipidemia: Secondary | ICD-10-CM | POA: Diagnosis present

## 2024-01-08 DIAGNOSIS — R42 Dizziness and giddiness: Secondary | ICD-10-CM

## 2024-01-08 DIAGNOSIS — D62 Acute posthemorrhagic anemia: Secondary | ICD-10-CM | POA: Diagnosis present

## 2024-01-08 DIAGNOSIS — M109 Gout, unspecified: Secondary | ICD-10-CM | POA: Diagnosis present

## 2024-01-08 DIAGNOSIS — Z79818 Long term (current) use of other agents affecting estrogen receptors and estrogen levels: Secondary | ICD-10-CM

## 2024-01-08 DIAGNOSIS — Z950 Presence of cardiac pacemaker: Secondary | ICD-10-CM

## 2024-01-08 DIAGNOSIS — K641 Second degree hemorrhoids: Secondary | ICD-10-CM | POA: Diagnosis present

## 2024-01-08 DIAGNOSIS — M054 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: Secondary | ICD-10-CM | POA: Diagnosis present

## 2024-01-08 DIAGNOSIS — Z91048 Other nonmedicinal substance allergy status: Secondary | ICD-10-CM

## 2024-01-08 DIAGNOSIS — K219 Gastro-esophageal reflux disease without esophagitis: Secondary | ICD-10-CM | POA: Diagnosis present

## 2024-01-08 DIAGNOSIS — F3340 Major depressive disorder, recurrent, in remission, unspecified: Secondary | ICD-10-CM | POA: Diagnosis present

## 2024-01-08 DIAGNOSIS — E039 Hypothyroidism, unspecified: Secondary | ICD-10-CM | POA: Diagnosis present

## 2024-01-08 DIAGNOSIS — Z885 Allergy status to narcotic agent status: Secondary | ICD-10-CM

## 2024-01-08 DIAGNOSIS — E1149 Type 2 diabetes mellitus with other diabetic neurological complication: Secondary | ICD-10-CM | POA: Diagnosis present

## 2024-01-08 DIAGNOSIS — Z91041 Radiographic dye allergy status: Secondary | ICD-10-CM

## 2024-01-08 DIAGNOSIS — I272 Pulmonary hypertension, unspecified: Secondary | ICD-10-CM | POA: Diagnosis present

## 2024-01-08 DIAGNOSIS — I2510 Atherosclerotic heart disease of native coronary artery without angina pectoris: Secondary | ICD-10-CM | POA: Diagnosis present

## 2024-01-08 DIAGNOSIS — M810 Age-related osteoporosis without current pathological fracture: Secondary | ICD-10-CM | POA: Diagnosis present

## 2024-01-08 DIAGNOSIS — E1122 Type 2 diabetes mellitus with diabetic chronic kidney disease: Secondary | ICD-10-CM | POA: Diagnosis present

## 2024-01-08 DIAGNOSIS — G2581 Restless legs syndrome: Secondary | ICD-10-CM | POA: Diagnosis present

## 2024-01-08 DIAGNOSIS — F419 Anxiety disorder, unspecified: Secondary | ICD-10-CM | POA: Diagnosis present

## 2024-01-08 DIAGNOSIS — K317 Polyp of stomach and duodenum: Secondary | ICD-10-CM | POA: Diagnosis present

## 2024-01-08 DIAGNOSIS — Z8673 Personal history of transient ischemic attack (TIA), and cerebral infarction without residual deficits: Secondary | ICD-10-CM

## 2024-01-08 DIAGNOSIS — N184 Chronic kidney disease, stage 4 (severe): Secondary | ICD-10-CM | POA: Diagnosis present

## 2024-01-08 DIAGNOSIS — Z833 Family history of diabetes mellitus: Secondary | ICD-10-CM

## 2024-01-08 DIAGNOSIS — N1831 Chronic kidney disease, stage 3a: Secondary | ICD-10-CM | POA: Diagnosis present

## 2024-01-08 LAB — COMPREHENSIVE METABOLIC PANEL WITH GFR
ALT: 66 U/L — ABNORMAL HIGH (ref 0–44)
AST: 70 U/L — ABNORMAL HIGH (ref 15–41)
Albumin: 3.5 g/dL (ref 3.5–5.0)
Alkaline Phosphatase: 184 U/L — ABNORMAL HIGH (ref 38–126)
Anion gap: 11 (ref 5–15)
BUN: 31 mg/dL — ABNORMAL HIGH (ref 8–23)
CO2: 25 mmol/L (ref 22–32)
Calcium: 8.8 mg/dL — ABNORMAL LOW (ref 8.9–10.3)
Chloride: 104 mmol/L (ref 98–111)
Creatinine, Ser: 1 mg/dL (ref 0.44–1.00)
GFR, Estimated: 56 mL/min — ABNORMAL LOW (ref >60)
Glucose, Bld: 181 mg/dL — ABNORMAL HIGH (ref 70–99)
Potassium: 4.4 mmol/L (ref 3.5–5.1)
Sodium: 140 mmol/L (ref 135–145)
Total Bilirubin: 1.1 mg/dL (ref 0.0–1.2)
Total Protein: 7 g/dL (ref 6.5–8.1)

## 2024-01-08 LAB — CBC WITH DIFFERENTIAL/PLATELET
Abs Immature Granulocytes: 0.01 10*3/uL (ref 0.00–0.07)
Abs Immature Granulocytes: 0.01 10*3/uL (ref 0.00–0.07)
Basophils Absolute: 0 10*3/uL (ref 0.0–0.1)
Basophils Absolute: 0 10*3/uL (ref 0.0–0.1)
Basophils Relative: 1 %
Basophils Relative: 0 %
Eosinophils Absolute: 0.1 10*3/uL (ref 0.0–0.5)
Eosinophils Absolute: 0.1 10*3/uL (ref 0.0–0.5)
Eosinophils Relative: 3 %
Eosinophils Relative: 3 %
HCT: 40.1 % (ref 36.0–46.0)
HCT: 39.5 % (ref 36.0–46.0)
Hemoglobin: 12.4 g/dL (ref 12.0–15.0)
Hemoglobin: 12.2 g/dL (ref 12.0–15.0)
Immature Granulocytes: 0 %
Immature Granulocytes: 0 %
Lymphocytes Relative: 40 %
Lymphocytes Relative: 41 %
Lymphs Abs: 1.3 10*3/uL (ref 0.7–4.0)
Lymphs Abs: 1.3 10*3/uL (ref 0.7–4.0)
MCH: 31.6 pg (ref 26.0–34.0)
MCH: 31.3 pg (ref 26.0–34.0)
MCHC: 30.9 g/dL (ref 30.0–36.0)
MCHC: 30.9 g/dL (ref 30.0–36.0)
MCV: 102.3 fL — ABNORMAL HIGH (ref 80.0–100.0)
MCV: 101.3 fL — ABNORMAL HIGH (ref 80.0–100.0)
Monocytes Absolute: 0.3 10*3/uL (ref 0.1–1.0)
Monocytes Absolute: 0.3 10*3/uL (ref 0.1–1.0)
Monocytes Relative: 10 %
Monocytes Relative: 9 %
Neutro Abs: 1.5 10*3/uL — ABNORMAL LOW (ref 1.7–7.7)
Neutro Abs: 1.5 10*3/uL — ABNORMAL LOW (ref 1.7–7.7)
Neutrophils Relative %: 46 %
Neutrophils Relative %: 47 %
Platelets: 143 10*3/uL — ABNORMAL LOW (ref 150–400)
Platelets: 156 10*3/uL (ref 150–400)
RBC: 3.92 MIL/uL (ref 3.87–5.11)
RBC: 3.9 MIL/uL (ref 3.87–5.11)
RDW: 14.4 % (ref 11.5–15.5)
RDW: 14.3 % (ref 11.5–15.5)
WBC: 3.2 10*3/uL — ABNORMAL LOW (ref 4.0–10.5)
WBC: 3.2 10*3/uL — ABNORMAL LOW (ref 4.0–10.5)
nRBC: 0 % (ref 0.0–0.2)
nRBC: 0 % (ref 0.0–0.2)

## 2024-01-08 LAB — URINALYSIS, ROUTINE W REFLEX MICROSCOPIC
Bilirubin Urine: NEGATIVE
Glucose, UA: 50 mg/dL — AB
Hgb urine dipstick: NEGATIVE
Ketones, ur: NEGATIVE mg/dL
Leukocytes,Ua: NEGATIVE
Nitrite: NEGATIVE
Protein, ur: NEGATIVE mg/dL
Specific Gravity, Urine: 1.009 (ref 1.005–1.030)
pH: 5 (ref 5.0–8.0)

## 2024-01-08 LAB — GLUCOSE, CAPILLARY: Glucose-Capillary: 74 mg/dL (ref 70–99)

## 2024-01-08 LAB — LACTIC ACID, PLASMA
Lactic Acid, Venous: 1.8 mmol/L (ref 0.5–1.9)
Lactic Acid, Venous: 1.2 mmol/L (ref 0.5–1.9)

## 2024-01-08 LAB — LIPASE, BLOOD: Lipase: 23 U/L (ref 11–51)

## 2024-01-08 LAB — TYPE AND SCREEN
ABO/RH(D): A POS
Antibody Screen: NEGATIVE

## 2024-01-08 LAB — TROPONIN I (HIGH SENSITIVITY): Troponin I (High Sensitivity): 8 ng/L (ref <18)

## 2024-01-08 LAB — CBG MONITORING, ED: Glucose-Capillary: 127 mg/dL — ABNORMAL HIGH (ref 70–99)

## 2024-01-08 MED ORDER — ONDANSETRON HCL 4 MG/2ML IJ SOLN: 4.0000 mg | Freq: Four times a day (QID) | INTRAMUSCULAR | Status: DC | PRN

## 2024-01-08 MED ORDER — AMIODARONE HCL 200 MG PO TABS
100.0000 mg | ORAL_TABLET | Freq: Every day | ORAL | Status: DC
  Administered 2024-01-09 – 2024-01-11 (×3): 100 mg via ORAL
  Filled 2024-01-08 (×3): qty 1

## 2024-01-08 MED ORDER — ACETAMINOPHEN 325 MG PO TABS
650.0000 mg | ORAL_TABLET | Freq: Four times a day (QID) | ORAL | Status: DC | PRN
  Administered 2024-01-08 – 2024-01-11 (×5): 650 mg via ORAL
  Filled 2024-01-08 (×5): qty 2

## 2024-01-08 MED ORDER — ROPINIROLE HCL 1 MG PO TABS
0.5000 mg | ORAL_TABLET | Freq: Every day | ORAL | Status: DC
  Administered 2024-01-08 – 2024-01-10 (×3): 0.5 mg via ORAL
  Filled 2024-01-08: qty 1
  Filled 2024-01-08: qty 2
  Filled 2024-01-08: qty 1

## 2024-01-08 MED ORDER — ONDANSETRON HCL 4 MG/2ML IJ SOLN
4.0000 mg | Freq: Once | INTRAMUSCULAR | Status: AC
  Administered 2024-01-08: 4 mg via INTRAVENOUS
  Filled 2024-01-08: qty 2

## 2024-01-08 MED ORDER — GABAPENTIN 300 MG PO CAPS
600.0000 mg | ORAL_CAPSULE | Freq: Three times a day (TID) | ORAL | Status: DC
  Administered 2024-01-08 – 2024-01-11 (×8): 600 mg via ORAL
  Filled 2024-01-08 (×8): qty 2

## 2024-01-08 MED ORDER — LEVOTHYROXINE SODIUM 75 MCG PO TABS
37.5000 ug | ORAL_TABLET | Freq: Every day | ORAL | Status: DC
  Administered 2024-01-09 – 2024-01-11 (×2): 37.5 ug via ORAL
  Filled 2024-01-08 (×3): qty 0.5

## 2024-01-08 MED ORDER — SERTRALINE HCL 50 MG PO TABS
125.0000 mg | ORAL_TABLET | Freq: Every day | ORAL | Status: DC
  Administered 2024-01-09 – 2024-01-11 (×3): 125 mg via ORAL
  Filled 2024-01-08 (×3): qty 3

## 2024-01-08 MED ORDER — INSULIN GLARGINE-YFGN 100 UNIT/ML ~~LOC~~ SOLN
19.0000 [IU] | Freq: Every day | SUBCUTANEOUS | Status: DC
  Administered 2024-01-09 – 2024-01-10 (×2): 19 [IU] via SUBCUTANEOUS
  Filled 2024-01-08 (×4): qty 0.19

## 2024-01-08 MED ORDER — LACTATED RINGERS IV SOLN: INTRAVENOUS | Status: AC

## 2024-01-08 MED ORDER — TRAMADOL HCL 50 MG PO TABS
50.0000 mg | ORAL_TABLET | Freq: Four times a day (QID) | ORAL | Status: DC | PRN
  Administered 2024-01-08: 50 mg via ORAL
  Filled 2024-01-08: qty 1

## 2024-01-08 MED ORDER — SODIUM CHLORIDE 0.9 % IV BOLUS
500.0000 mL | Freq: Once | INTRAVENOUS | Status: DC
Start: 2024-01-08 — End: 2024-01-11

## 2024-01-08 MED ORDER — SENNOSIDES-DOCUSATE SODIUM 8.6-50 MG PO TABS: 1.0000 | ORAL_TABLET | Freq: Every evening | ORAL | Status: DC | PRN

## 2024-01-08 MED ORDER — ADULT MULTIVITAMIN W/MINERALS CH
1.0000 | ORAL_TABLET | Freq: Every day | ORAL | Status: DC
  Administered 2024-01-09 – 2024-01-11 (×2): 1 via ORAL
  Filled 2024-01-08 (×3): qty 1

## 2024-01-08 MED ORDER — SERTRALINE HCL 50 MG PO TABS: 25.0000 mg | ORAL_TABLET | Freq: Every morning | ORAL | Status: DC

## 2024-01-08 MED ORDER — FAMOTIDINE 20 MG PO TABS
10.0000 mg | ORAL_TABLET | Freq: Every day | ORAL | Status: DC
  Administered 2024-01-09 – 2024-01-11 (×3): 10 mg via ORAL
  Filled 2024-01-08 (×3): qty 1

## 2024-01-08 MED ORDER — FAMOTIDINE IN NACL 20-0.9 MG/50ML-% IV SOLN
20.0000 mg | Freq: Once | INTRAVENOUS | Status: AC
  Administered 2024-01-08: 20 mg via INTRAVENOUS
  Filled 2024-01-08: qty 50

## 2024-01-08 MED ORDER — THIAMINE MONONITRATE 100 MG PO TABS
100.0000 mg | ORAL_TABLET | Freq: Every day | ORAL | Status: DC
  Administered 2024-01-09 – 2024-01-11 (×2): 100 mg via ORAL
  Filled 2024-01-08 (×3): qty 1

## 2024-01-08 MED ORDER — ACETAMINOPHEN 650 MG RE SUPP: 650.0000 mg | Freq: Four times a day (QID) | RECTAL | Status: DC | PRN

## 2024-01-08 MED ORDER — ONDANSETRON HCL 4 MG PO TABS
4.0000 mg | ORAL_TABLET | Freq: Four times a day (QID) | ORAL | Status: DC | PRN
Start: 2024-01-08 — End: 2024-01-13

## 2024-01-08 MED ORDER — ALLOPURINOL 100 MG PO TABS
100.0000 mg | ORAL_TABLET | Freq: Every day | ORAL | Status: DC
  Administered 2024-01-09 – 2024-01-11 (×3): 100 mg via ORAL
  Filled 2024-01-08 (×3): qty 1

## 2024-01-08 NOTE — Assessment & Plan Note (Signed)
 Home ropinirole 0.5 mg nightly resumed

## 2024-01-08 NOTE — ED Notes (Signed)
 Given food in the ED

## 2024-01-08 NOTE — Assessment & Plan Note (Signed)
 Suspect secondary to gastroenteritis in setting of GI loss Status post NS 500 ml bolus per EDP On admission, will continue with LR 125 mL/h, 1 day ordered Fall precautions

## 2024-01-08 NOTE — ED Notes (Signed)
 IV team consult placed due to multiple PIV attempted by multiple RN's.

## 2024-01-08 NOTE — Hospital Course (Addendum)
 Ms. Nicolemarie Aman is an 83 year old female with history of cholecystectomy, hysterectomy, paroxysmal atrial fibrillation on Eliquis , CAD, anxiety, vascular dementia, presents emergency department from [facility] for chief concerns of rectal bleeding.  Vitals in the ED showed temperature of 97.8, on repeat was 98, respiration rate was 19, heart rate of 72, blood pressure 158/93, SpO2 100% on room air.  Serum sodium is 140, potassium 4.4, chloride 104, bicarb 25, BUN of 31, serum creatinine 1.00, EGFR 56, nonfasting blood glucose 181, WBC 3.2, hemoglobin 12.2, platelets of 156.  HS troponin was 8.  Lactic acid was 1.8, on repeat was 1.2.  ED treatment: Ondansetron  4 mg IV one-time dose, sodium chloride  500 mL liter bolus, famotidine  20 mg IV one-time dose.

## 2024-01-08 NOTE — Assessment & Plan Note (Signed)
 Patient has history of elevated transaminitis in the past Recommend outpatient follow-up with PCP

## 2024-01-08 NOTE — ED Triage Notes (Addendum)
 Pt presents to ED from [facility] for c/o of rectal bleeding since this past Friday. Pt states intermittent ABD pan, pt states HX of same 5 years ago. Pt does take Eliquis  daily, pt states bright red blood in color, pt denies clot in stool. Pt states blood is bright red in color. Pt does endorses ABD pain, pt states generalized ABD pain. Pt states pain is worse since Friday but the bleeding has remained the same. Last dose of Eliquis  was this morning. Pt is A&OX4. VSS.    Pt states HX of polyp removal.

## 2024-01-08 NOTE — ED Notes (Signed)
 Pt ambulatory to toilet in room with x1 assist. Pt given new brief and back in bed with no other needs at this time. Call light in reach.

## 2024-01-08 NOTE — ED Provider Notes (Signed)
 [facility] Provider Note    Event Date/Time   First MD Initiated Contact with Patient 01/08/24 [phone]     (approximate)   History   Abdominal Pain and Rectal Bleeding   HPI  Deborah Obrien is a 83 y.o. female with a history of atrial fibrillation on Eliquis , CKD, diabetes, hypertension, hypothyroidism, GERD, anemia, and GI bleed from gastric polyps who presents with blood in her stool.  The patient states that she started having diarrhea 2 days ago and noticed drops of bright red blood mixed in with the stool.  She denies any dark or tarry stools.  She states she has had some nausea but no vomiting.  She reports diffuse abdominal pain which is somewhat worse in her upper abdomen.  She also feels somewhat weak and lightheaded.  She states that she last took Eliquis  this morning.  I reviewed the past medical records.  The patient was admitted to the hospitalist service in February with a hematoma of her right hip.  Previously she was admitted in 2019 with an acute GI bleed requiring blood transfusion.  Upper endoscopy revealed gastric polyps with evidence of recent bleeding.   Physical Exam   Triage Vital Signs: ED Triage Vitals  Encounter Vitals Group     BP 01/08/24 0815 (!) 158/93     Systolic BP Percentile --      Diastolic BP Percentile --      Pulse Rate 01/08/24 0815 72     Resp 01/08/24 0815 19     Temp 01/08/24 0815 97.8 F (36.6 C)     Temp Source 01/08/24 0815 Oral     SpO2 01/08/24 0815 100 %     Weight 01/08/24 0816 153 lb (69.4 kg)     Height 01/08/24 0816 5\' 3"  (1.6 m)     Head Circumference --      Peak Flow --      Pain Score 01/08/24 0816 8     Pain Loc --      Pain Education --      Exclude from Growth Chart --     Most recent vital signs: Vitals:   01/08/24 1400 01/08/24 1500  BP:    Pulse: (!) 59 60  Resp: 17 14  Temp:    SpO2: 100% 98%    General: Alert, weak appearing, no distress.  CV:  Good peripheral perfusion.   Resp:  Normal effort.  Abd:  Soft with no focal tenderness.  No distention.  Other:  No jaundice or scleral icterus.  Brown stool, guaiac positive on DRE.  No visible external hemorrhoids.   ED Results / Procedures / Treatments   Labs (all labs ordered are listed, but only abnormal results are displayed) Labs Reviewed  COMPREHENSIVE METABOLIC PANEL WITH GFR - Abnormal; Notable for the following components:      Result Value   Glucose, Bld 181 (*)    BUN 31 (*)    Calcium  8.8 (*)    AST 70 (*)    ALT 66 (*)    Alkaline Phosphatase 184 (*)    GFR, Estimated 56 (*)    All other components within normal limits  CBC WITH DIFFERENTIAL/PLATELET - Abnormal; Notable for the following components:   WBC 3.2 (*)    MCV 102.3 (*)    Platelets 143 (*)    Neutro Abs 1.5 (*)    All other components within normal limits  URINALYSIS, ROUTINE W REFLEX MICROSCOPIC - Abnormal;  Notable for the following components:   Color, Urine YELLOW (*)    APPearance CLEAR (*)    Glucose, UA 50 (*)    All other components within normal limits  CBC WITH DIFFERENTIAL/PLATELET - Abnormal; Notable for the following components:   WBC 3.2 (*)    MCV 101.3 (*)    Neutro Abs 1.5 (*)    All other components within normal limits  GASTROINTESTINAL PANEL BY PCR, STOOL (REPLACES STOOL CULTURE)  LACTIC ACID, PLASMA  LACTIC ACID, PLASMA  LIPASE, BLOOD  TYPE AND SCREEN  TROPONIN I (HIGH SENSITIVITY)     EKG  ED ECG REPORT I, Lind Repine, the attending physician, personally viewed and interpreted this ECG.  Date: 01/08/2024 EKG Time: 0849 Rate: 60 Rhythm: Undetermined rhythm, possible A-V dissociation QRS Axis: normal Intervals: normal ST/T Wave abnormalities: normal Narrative Interpretation: no evidence of acute ischemia    RADIOLOGY  CT abdomen/pelvis: I independently viewed and interpreted the images; there are no dilated bowel loops or any free air or free fluid.  Radiology report indicates  the following:  IMPRESSION:  1. No acute findings in the abdomen or pelvis.  2. Mild dilatation of the right renal pelvis and proximal ureter  without evidence of renal calculi. Moderate distension of the  bladder.  3. Stable atherosclerosis of the abdominal aorta without aneurysm.  4. Stable compression deformities at T12 and L1 with prior vertebral  augmentation.  5. Status post cholecystectomy and hysterectomy.     PROCEDURES:  Critical Care performed: No  Procedures   MEDICATIONS ORDERED IN ED: Medications  sodium chloride  0.9 % bolus 500 mL (0 mLs Intravenous Hold 01/08/24 0851)  acetaminophen  (TYLENOL ) tablet 650 mg (has no administration in time range)    Or  acetaminophen  (TYLENOL ) suppository 650 mg (has no administration in time range)  ondansetron  (ZOFRAN ) tablet 4 mg (has no administration in time range)    Or  ondansetron  (ZOFRAN ) injection 4 mg (has no administration in time range)  senna-docusate (Senokot-S) tablet 1 tablet (has no administration in time range)  lactated ringers  infusion (has no administration in time range)  amiodarone  (PACERONE ) tablet 100 mg (has no administration in time range)  traMADol  (ULTRAM ) tablet 50 mg (has no administration in time range)  allopurinol  (ZYLOPRIM ) tablet 100 mg (has no administration in time range)  sertraline  (ZOLOFT ) tablet 100 mg (has no administration in time range)  sertraline  (ZOLOFT ) tablet 25 mg (has no administration in time range)  levothyroxine  (SYNTHROID ) tablet 37.5 mcg (has no administration in time range)  insulin  glargine-yfgn (SEMGLEE ) injection 19 Units (has no administration in time range)  famotidine  (PEPCID ) tablet 10 mg (has no administration in time range)  rOPINIRole  (REQUIP ) tablet 0.5 mg (has no administration in time range)  gabapentin  (NEURONTIN ) capsule 600 mg (has no administration in time range)  thiamine  (VITAMIN B1) tablet 100 mg (has no administration in time range)  multivitamin  with minerals tablet 1 tablet (has no administration in time range)  famotidine  (PEPCID ) IVPB 20 mg premix (0 mg Intravenous Stopped 01/08/24 1407)  ondansetron  (ZOFRAN ) injection 4 mg (4 mg Intravenous Given 01/08/24 1336)     IMPRESSION / MDM / ASSESSMENT AND PLAN / ED COURSE  I reviewed the triage vital signs and the nursing notes.  83 year old female with PMH as noted above presents with diarrhea for the last several days along with bright red blood in her stool, abdominal pain, and generalized weakness and lightheadedness.  On exam she is hypertensive.  Other  vital signs are normal.  Abdomen does not demonstrate any focal tenderness.  Differential diagnosis includes, but is not limited to, diverticulitis, colitis, gastroenteritis, diverticulosis, internal hemorrhoids, less likely upper GI bleed.  We will obtain lab workup, CT, give fluids, and reassess.  Patient's presentation is most consistent with acute presentation with potential threat to life or bodily function.  The patient is on the cardiac monitor to evaluate for evidence of arrhythmia and/or significant heart rate changes.   ----------------------------------------- 3:34 PM on 01/08/2024 -----------------------------------------  Lab workup is reassuring.  CBC showed a hemoglobin of 12.4.  Repeat after 4 hours shows hemoglobin of 12.2, so I am not concerned for acute blood loss.  Urinalysis is negative.  Chemistry is also unremarkable.  CT abdomen/pelvis shows no acute abnormalities.  Overall presentation is most consistent with a gastroenteritis or gastritis.  I discussed disposition with the patient.  I initially considered discharge since her symptoms seem to be improving and the workup is reassuring.  However, the patient is continue to have abdominal pain and nausea when she tried to eat, she still feels quite lightheaded with standing up.  Given her history of GI bleed requiring transfusion as well as her age and  comorbidities, I feel it is safest to observe her in the hospital.  I consulted Dr. Reinhold Carbine from the hospitalist service; based on our discussion she agrees to evaluate the patient for admission.   FINAL CLINICAL IMPRESSION(S) / ED DIAGNOSES   Final diagnoses:  Blood in stool     Rx / DC Orders   ED Discharge Orders     None        Note:  This document was prepared using Dragon voice recognition software and may include unintentional dictation errors.    Lind Repine, MD 01/08/24 [phone]

## 2024-01-08 NOTE — ED Notes (Signed)
 CCMD called to place pt on cardiac monitor

## 2024-01-08 NOTE — Assessment & Plan Note (Signed)
 Home levothyroxine  37.5 mcg daily before breakfast resumed

## 2024-01-08 NOTE — Assessment & Plan Note (Signed)
 With abdominal pain, query gastroenteritis Check GI panel Symptomatic support: Sodium chloride  500 mL liter bolus per EDP, LR infusion at 125 mL/h, 1 day ordered Recheck CBC in a.m.

## 2024-01-08 NOTE — Assessment & Plan Note (Signed)
 Continue Zoloft

## 2024-01-08 NOTE — Assessment & Plan Note (Signed)
 Vs gastritis with bleeding

## 2024-01-08 NOTE — Assessment & Plan Note (Signed)
 Will hold home Eliquis  on admission due to bright red blood per rectum.  Stroke risk higher being off anticoagulation.

## 2024-01-08 NOTE — ED Notes (Signed)
 IV team at bedside

## 2024-01-08 NOTE — H&P (Addendum)
 History and Physical   Deborah Obrien ZOX:[id number] DOB: 1941-05-07 DOA: 01/08/2024  PCP: Carlen Chasten, FNP  Outpatient Specialists: Dr. Antony Baumgartner, gastroenterology Patient coming from: [facility]  I have personally briefly reviewed patient's old medical records in [facility] EMR.  Chief Concern: Dizziness, bright red blood per rectum  HPI: [name] is an 83 year old female with history of cholecystectomy, hysterectomy, paroxysmal atrial fibrillation on Eliquis , CAD, anxiety, vascular dementia, presents emergency department from [facility] for chief concerns of rectal bleeding.  Vitals in the ED showed temperature of 97.8, on repeat was 98, respiration rate was 19, heart rate of 72, blood pressure 158/93, SpO2 100% on room air.  Serum sodium is 140, potassium 4.4, chloride 104, bicarb 25, BUN of 31, serum creatinine 1.00, EGFR 56, nonfasting blood glucose 181, WBC 3.2, hemoglobin 12.2, platelets of 156.  HS troponin was 8.  Lactic acid was 1.8, on repeat was 1.2.  ED treatment: Ondansetron  4 mg IV one-time dose, sodium chloride  500 mL liter bolus, famotidine  20 mg IV one-time dose. ------------------------------- At bedside, patient is able to tell me her first and last name, age, location, current calendar year.  She reports that on Friday, she had some bright red blood with wiping.  And on Saturday she had some diarrhea with some bright red blood in the especially on wiping and this persisted on Sunday.  Saturday and Sunday, she had the most blood that she saw.  On Monday it improved however she still has some bright red blood with wiping.  This is very concerning for her.  She endorses that she does have a history of hemorrhoids, and colon polyps and she follows with Dr. Antony Baumgartner outpatient.  She does not know when the last time she had a colonoscopy was.  She denies chest pain, shortness of breath, dysuria, hematuria, syncope.  She reports that when she went to  use the restroom in the ED, and upon getting up she felt very weak and dizzy and this is very strange for her.  She had 1 other episode like this on Saturday.  She reports that she has had abdominal discomfort, tenderness as well since Friday.  She reports that this is caused her to not eat very much due to every time she eats she has abdominal tenderness.  Social history: Patient currently lives at [facility].  She denies tobacco, EtOH, recreational drug use.  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  ROS: Constitutional: no weight change, no fever ENT/Mouth: no sore throat, no rhinorrhea Eyes: no eye pain, no vision changes Cardiovascular: no chest pain, no dyspnea,  no edema, no palpitations Respiratory: no cough, no sputum, no wheezing Gastrointestinal: no nausea, no vomiting, no diarrhea, no constipation, + BRBPR Genitourinary: no urinary incontinence, no dysuria, no hematuria Musculoskeletal: no arthralgias, no myalgias Skin: no skin lesions, no pruritus, Neuro: + weakness, no loss of consciousness, no syncope Psych: no anxiety, no depression, + decrease appetite Heme/Lymph: no bruising, no bleeding  ED Course: Discussed with EDP, patient requires hospitalization for chief concerns of bright red blood per rectum and weakness/dizziness upon standing.  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  Dizziness Suspect secondary to gastroenteritis in setting of GI loss Status post NS 500 ml bolus per EDP On admission, will continue with LR 125 mL/h, 1 day ordered Fall precautions  Restless leg syndrome Home ropinirole  0.5 mg nightly resumed  Hypothyroidism Home levothyroxine  37.5 mcg daily before breakfast resumed  Gastroenteritis Vs gastritis with bleeding  Paroxysmal atrial fibrillation (HCC) Will hold home Eliquis  on admission due to bright red blood per rectum  Transaminitis Patient has history of elevated transaminitis in the past Recommend outpatient follow-up with PCP  Recurrent major depressive disorder, in remission (HCC) Home sertraline  125 mg every morning resumed  Chart reviewed.   DVT prophylaxis: TED hose Code Status: DNR/DNI Diet: Heart healthy Family Communication: A phone call was offered, patient declined stating that her son already knows she is being admitted to the hospital Disposition Plan: Pending clinical course Consults called: None at this time Admission status: Telemetry medical, observation  Past Medical History:  Diagnosis Date   Acute respiratory failure (HCC) 04/09/2018   Anemia    Anxiety    Aortic atherosclerosis (HCC)    Bowel obstruction (HCC)    Carotid artery disease (HCC)    Cerebral microvascular disease    Chronic heart failure with preserved ejection fraction (HFpEF) (HCC)    a.) TTE 04/10/2018: EF 60-65%, mildly dil LA/RA, mod dil RV, mod TR, PASP ; b.) TTE 01/22/2023: EF 60-65%, no rwma, low-nl RV fxn, RVSP 51.2 mmHg. Mild MR. Mod-sev TR.   CKD (chronic kidney disease), stage IV (HCC)    Closed fracture of one rib of right side 05/29/2020   Colon polyp    Complete tear of left rotator cuff 10/16/2014   Coronary artery disease (non-obstructive) 08/25/2016   a.) LHC 08/25/2016:  25%  oLM-LM, 20% mLAD, 15% o-pLCx - med mgmt; b.) MV 10/20/2017: no isch/infarct, EF 55-65%   DDD (degenerative disc disease), cervical    a.) s/p ACDF C6-C7   Depression    Diabetic neuropathy (HCC)    Diarrhea 03/04/2022   Displaced intertrochanteric fracture of right femur (HCC)    Fatty liver    Gastritis without bleeding    GERD (gastroesophageal reflux disease)    GI bleed    Gout    Hypertension    Hypothyroidism    Long term current use of amiodarone     Low back pain    history of kyphoplasty t12-l1   Morbid obesity (HCC)    Multiple gastric polyps    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   a.) presurgical PCR (+) 10/10/2023 prior to REMOVAL OF NAIL HARDWARE WITH SCREW EXCHANGE (RIGHT HIP)   On apixaban  therapy    Osteoarthritis    Osteoporosis    PAF (paroxysmal atrial fibrillation) (HCC) 2016   a.) CHA2DS2-VASc = 9 (age x 2, sex, HFpEF, HTN, TIA x 2, vascular disease, T2DM) as of 10/13/2023; b.) s/p DCCV 06/28/2017 (120 J x1), 02/06/2018 (150c J x 1), 07/15/2020 (75 J x ); c.) ardiac rate/rhythm maintained on oral amiodarone ; chronically anticoagulated using apixaban    Presence of permanent cardiac pacemaker 07/01/2015   a.) PPM placed 07/01/2015; b.)  device upgraded to dual chamber MDT Azure XT DR MRI SureScan (SN: [id number]) 08/29/2017   Pulmonary hypertension (HCC)    a.) TTE 04/10/2018: PASP 50 mmHg; b.) TTE 01/22/2023: RVSP 51.2 mmHg   Sinus node dysfunction (HCC)    a.) s/p PPM placement 06/2015 --> device upgraded 08/2017   T2DM (type 2 diabetes mellitus) (HCC)    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 plate & screws    BACK SURGERY  2019   patient describes kyphoplasty for compression fractures, MD referral said fusion   BREAST BIOPSY Right 04/2015   stereo fibroadenomatous change, neg for atypia   BREAST EXCISIONAL BIOPSY Left    neg   CARDIAC CATHETERIZATION Left 08/25/2016   Procedure: Left Heart Cath and Coronary Angiography;  Surgeon: Michelle Aid, MD;  Location: [facility] INVASIVE CV LAB;  Service: Cardiovascular;  Laterality: Left;   CARDIOVERSION N/A 06/28/2017   Procedure: CARDIOVERSION;  Surgeon: Michelle Aid, MD;  Location: [facility] ORS;  Service: Cardiovascular;  Laterality: N/A;   CARDIOVERSION N/A 02/06/2018   Procedure: CARDIOVERSION;  Surgeon: Loyde Rule, MD;  Location: [facility] ENDOSCOPY;  Service: Cardiovascular;  Laterality: N/A;   CARDIOVERSION N/A 07/15/2020   Procedure: CARDIOVERSION;  Surgeon: Hugh Madura, MD;  Location: [facility] ENDOSCOPY;  Service: Cardiovascular;  Laterality: N/A;   CATARACT EXTRACTION, BILATERAL  2012   CHOLECYSTECTOMY     colon blockage  1999   COLON SURGERY  2000   removed 20% of colon. colon had collapsed   COLONOSCOPY  2016   polyps removed 2016   DORSAL COMPARTMENT RELEASE Left 09/14/2016   Procedure: RELEASE DORSAL COMPARTMENT (DEQUERVAIN);  Surgeon: Arlyne Lame, MD;  Location: [facility] ORS;  Service: Orthopedics;  Laterality: Left;   ESOPHAGOGASTRODUODENOSCOPY N/A 01/27/2015   Procedure: ESOPHAGOGASTRODUODENOSCOPY (EGD);  Surgeon: Deveron Fly, MD;  Location: [facility] ENDOSCOPY;  Service: Endoscopy;  Laterality: N/A;   ESOPHAGOGASTRODUODENOSCOPY (EGD) WITH PROPOFOL  N/A 07/24/2015   Procedure: ESOPHAGOGASTRODUODENOSCOPY (EGD) WITH PROPOFOL ;  Surgeon: Marnee Sink, MD;  Location: [facility] ENDOSCOPY;  Service: Endoscopy;  Laterality: N/A;   ESOPHAGOGASTRODUODENOSCOPY (EGD) WITH PROPOFOL  N/A 04/12/2018   Procedure: ESOPHAGOGASTRODUODENOSCOPY (EGD) WITH  PROPOFOL ;  Surgeon: Luke Salaam, MD;  Location: [facility] ENDOSCOPY;  Service: Gastroenterology;  Laterality: N/A;   ESOPHAGOGASTRODUODENOSCOPY (EGD) WITH PROPOFOL  N/A 06/22/2018   Procedure: ESOPHAGOGASTRODUODENOSCOPY (EGD) WITH PROPOFOL ;  Surgeon: [name] Medford, MD;  Location: [facility] ENDOSCOPY;  Service: Endoscopy;  Laterality: N/A;   ESOPHAGOGASTRODUODENOSCOPY (EGD) WITH PROPOFOL  N/A 07/09/2018   Procedure: ESOPHAGOGASTRODUODENOSCOPY (EGD) WITH PROPOFOL  with resection of gastric polyps;  Surgeon: Luke Salaam, MD;  Location: [facility] ENDOSCOPY;  Service: Gastroenterology;  Laterality: N/A;   ESOPHAGOGASTRODUODENOSCOPY (EGD) WITH PROPOFOL  N/A 05/17/2019   Procedure: ESOPHAGOGASTRODUODENOSCOPY (EGD) WITH PROPOFOL ;  Surgeon: Luke Salaam, MD;  Location: [facility] ENDOSCOPY;  Service: Gastroenterology;  Laterality: N/A;   EUS N/A 06/22/2018   Procedure: UPPER ENDOSCOPIC ULTRASOUND (EUS) RADIAL;  Surgeon: [name] Medford, MD;  Location: [facility] ENDOSCOPY;  Service: Endoscopy;  Laterality: N/A;   EYE SURGERY Bilateral    cataract extractions  HARDWARE REMOVAL Right 10/17/2023   Procedure: Right hip removal of nail hardware with screw exchange;  Surgeon: Lorri Rota, MD;  Location: [facility] ORS;  Service: Orthopedics;  Laterality: Right;   HUMERUS IM NAIL Left 06/02/2022   Procedure: INTRAMEDULLARY (IM) NAIL HUMERAL;  Surgeon: Elner Hahn, MD;  Location: [facility] ORS;  Service: Orthopedics;  Laterality: Left;   INTRAMEDULLARY (IM) NAIL INTERTROCHANTERIC Right 01/21/2023   Procedure: INTRAMEDULLARY (IM) NAIL INTERTROCHANTERIC;  Surgeon: Lorri Rota, MD;  Location: [facility] ORS;  Service: Orthopedics;  Laterality: Right;   JOINT REPLACEMENT Bilateral 2014   Bilateral Knee replacement   LEAD REVISION/REPAIR N/A 08/29/2017   Procedure: LEAD REVISION/REPAIR;  Surgeon: Lei Pump, MD;  Location: [facility] INVASIVE CV LAB;  Service: Cardiovascular;  Laterality: N/A;   OOPHORECTOMY     PACEMAKER INSERTION Left 07/01/2015    Procedure: INSERTION PACEMAKER;  Surgeon: Percival Brace, MD;  Location: [facility] ORS;  Service: Cardiovascular;  Laterality: Left;   PACEMAKER REVISION N/A 08/28/2017   Procedure: PACEMAKER REVISION;  Surgeon: Verona Goodwill, MD;  Location: [facility] INVASIVE CV LAB;  Service: Cardiovascular;  Laterality: N/A;   REPLACEMENT TOTAL KNEE BILATERAL     SHOULDER ARTHROSCOPY WITH ROTATOR CUFF REPAIR AND OPEN BICEPS TENODESIS Left 11/21/2019   Procedure: LEFT SHOULDER ARTHROSCOPY WITH DEBRIDEMENT, DECOMPRESSION, ROTATOR CUFF REPAIR AND BICEPS TENOLYSIS;  Surgeon: Elner Hahn, MD;  Location: [facility] ORS;  Service: Orthopedics;  Laterality: Left;   SHOULDER ARTHROSCOPY WITH SUBACROMIAL DECOMPRESSION Left 2013   TEE WITHOUT CARDIOVERSION N/A 06/28/2017   Procedure: TRANSESOPHAGEAL ECHOCARDIOGRAM (TEE);  Surgeon: Michelle Aid, MD;  Location: [facility] ORS;  Service: Cardiovascular;  Laterality: N/A;   TUBAL LIGATION     social History:  reports that she has never smoked. She has never used smokeless tobacco. She reports that she does not drink alcohol  and does not use drugs.  Allergies  Allergen Reactions   Fish Allergy Shortness Of Breath    All kinds of fish. Severe vomitting even with smell of fish.    Macrolides And Ketolides Other (See Comments)    Severe stomach pain (mycins)   Meperidine Shortness Of Breath, Nausea Only and Other (See Comments)    Stomach pain    Other Nausea Only, Swelling, Rash, Anaphylaxis, Diarrhea, Shortness Of Breath and Other (See Comments)    Allergen: NON-STEROIDS, bextra - hands and feet swell  Reaction:  Stomach pain   Other Reaction: Intolerance   Prednisone Anaphylaxis and Other (See Comments)    (facial swelling/redness/burning)Increases pts blood sugar  Pt states that she is allergic to all steroids.     Shellfish Allergy Anaphylaxis, Shortness Of Breath, Diarrhea, Nausea Only and Rash    Stomach pain    Sulfa Antibiotics Shortness Of Breath, Diarrhea, Nausea  Only and Other (See Comments)    Reaction:  Stomach pain     Sulfacetamide Sodium Diarrhea, Nausea Only, Other (See Comments) and Shortness Of Breath    Reaction:  Stomach pain    Telbivudine Other (See Comments)    Unknown reaction   Uloric [Febuxostat] Anaphylaxis    Locks pt's body up    Aspirin  Rash and Other (See Comments)    Stomach pain (tolerates lower doses)   Celecoxib Other (See Comments)    GI bleed, weakness, and stomach pain.     Cephalexin Diarrhea, Nausea Only and Other (See Comments)    stomach pain    Codeine Diarrhea, Nausea Only and Other (See Comments)    Reaction:  Stomach pain Pt  tolerates morphine     Dilaudid  [Hydromorphone  Hcl] Other (See Comments)    Reactions: easy to overdose - blood pressure drops really low   Erythromycin Diarrhea, Nausea Only and Other (See Comments)    Reaction:  Stomach pain    Iodinated Contrast Media Rash and Other (See Comments)    Pt states that she is unable to have because she has chronic kidney disease.     Lipitor [Atorvastatin Calcium ] Nausea Only    Reaction: nausea, weakness, pass blood   Oxycodone  Other (See Comments)    Reaction:  Stomach pain    Aleve [Naproxen]     Reaction: severe stomach pain   Atorvastatin Nausea Only and Other (See Comments)    Weakness    Cephalosporins Diarrhea, Nausea Only and Other (See Comments)    Other: abdominal pain  Tolerated 1st generation cephalosporin (CEFAZOLIN ) on 06/02/2022 and 01/21/2023 without documented ADRs.    Doxycycline     Stomach pain    Iodine Rash   Motrin [Ibuprofen]     Reaction: severe stomach pain   Tape Other (See Comments)    Causes pts skin to tear  Pt states that she is able to use paper tape.       Valdecoxib Swelling and Other (See Comments)    Pt states that her hands and feet swell.     Family History  Problem Relation Age of Onset   Diabetes Mellitus II Mother    CAD Mother    Heart attack Mother    Cancer Father        skin   Diabetes  Mellitus II Brother    Stroke Brother    Breast cancer Neg Hx    Family history: Family history reviewed and not pertinent.  Prior to Admission medications   Medication Sig Start Date End Date Taking? Authorizing Provider  acetaminophen  (TYLENOL ) 325 MG tablet Take 2 tablets (650 mg total) by mouth every 6 (six) hours as needed for mild pain, moderate pain or fever (or Fever >/= 101). 02/01/23   Wieting, Richard, MD  acetaminophen  (TYLENOL ) 500 MG tablet Take 2 tablets (1,000 mg total) by mouth every 8 (eight) hours. 10/17/23 10/16/24  Lorri Rota, MD  albuterol  (VENTOLIN  HFA) [phone] Base) MCG/ACT inhaler Inhale 2 puffs into the lungs every 6 (six) hours as needed for wheezing or shortness of breath (cough).    [provider]  allopurinol  (ZYLOPRIM ) 100 MG tablet Take 100 mg by mouth in the morning.    [provider]  alum & mag hydroxide-simeth (MAALOX/MYLANTA) 200-200-20 MG/5ML suspension Take 10 mLs by mouth 3 (three) times daily as needed for indigestion or heartburn.    [provider]  amiodarone  (PACERONE ) 200 MG tablet Take 0.5 tablets (100 mg total) by mouth daily. 07/11/23   Riddle, Suzann, NP  apixaban  (ELIQUIS ) 2.5 MG TABS tablet Take 1 tablet (2.5 mg total) by mouth 2 (two) times daily. 03/06/22   Alexander, Natalie, DO  Cholecalciferol  (VITAMIN D ) 50 MCG (2000 UT) CAPS Take 2,000 Units by mouth in the morning.    [provider]  docusate sodium  (COLACE) 100 MG capsule Take 100 mg by mouth 2 (two) times daily.    [provider]  Ergocalciferol  (VITAMIN D2) 50 MCG (2000 UT) TABS Take 1 tablet by mouth daily.    [provider]  estradiol (ESTRACE) 0.1 MG/GM vaginal cream Place 1 Applicatorful vaginally every Tuesday, Thursday, and Saturday at 6 PM.    [provider]  famotidine  (PEPCID ) 10 MG tablet Take 10 mg by mouth in the morning.    [provider]  Fluticasone Furoate (FLONASE SENSIMIST NA) Place 1 spray  into the nose in the morning.    [provider]  furosemide  (LASIX ) 20 MG tablet Take 1 tablet (20 mg total) by mouth daily. 02/02/23   Verla Glaze, MD  gabapentin  (NEURONTIN ) 300 MG capsule Take 600 mg by mouth 3 (three) times daily. (0800, 1400 & 2000)    [provider]  ibandronate (BONIVA) 150 MG tablet Take 150 mg by mouth every 30 (thirty) days. Take in the morning with a full glass of water , on an empty stomach, and do not take anything else by mouth or lie down for the next 30 min.    [provider]  Insulin  Regular Human (NOVOLIN R FLEXPEN RELION) 100 UNIT/ML KwikPen Inject 0-10 Units into the skin as directed. Check blood sugar 3 times daily and inject insulin  per sliding scale: Less than 70=0 units and eat a snack recheck in 15 minutes; 100-120=2 units 121-150=3 units 151-200=4 units 201-250=5 units 251-300=6 units 301-350=7 units 351-400=8 units 401-450=9 units 451-500=10 units; call MD for reading greater than 500.    [provider]  iron  polysaccharides (NIFEREX) 150 MG capsule Take 1 capsule (150 mg total) by mouth daily. 11/07/23   Donaciano Frizzle, MD  LANTUS  100 UNIT/ML injection Inject 0.09 mLs (9 Units total) into the skin at bedtime. Patient taking differently: Inject 19 Units into the skin at bedtime. 02/01/23   Verla Glaze, MD  levothyroxine  (SYNTHROID ) 75 MCG tablet Take 37.5 mcg by mouth daily before breakfast. (0600)    [provider]  magnesium  hydroxide (MILK OF MAGNESIA) 400 MG/5ML suspension Take 30 mLs by mouth daily as needed (constipation).    [provider]  Multiple Vitamin (MULTIVITAMIN WITH MINERALS) TABS tablet Take 1 tablet by mouth daily. 02/02/23   Verla Glaze, MD  nystatin  cream (MYCOSTATIN ) Apply 1 Application topically 2 (two) times daily as needed (rash).    [provider]  nystatin  powder Apply 1 Application topically in the morning and at bedtime. (0800 & 2000) Apply  topically to groin    [provider]  ondansetron  (ZOFRAN -ODT) 4 MG disintegrating tablet Take 1 tablet (4 mg total) by mouth every 8 (eight) hours as needed for nausea or vomiting. 10/17/23   Lorri Rota, MD  polyethylene glycol (MIRALAX  / GLYCOLAX ) 17 g packet Take 17 g by mouth daily. 02/01/23   Verla Glaze, MD  Polyvinyl Alcohol -Povidone PF 1.4-0.6 % SOLN Place 1 drop into both eyes in the morning and at bedtime.    [provider]  rOPINIRole  (REQUIP ) 0.5 MG tablet Take 0.5 mg by mouth at bedtime. (2000)    [provider]  senna (SENOKOT) 8.6 MG tablet Take 1 tablet by mouth at bedtime.    [provider]  sertraline  (ZOLOFT ) 100 MG tablet Take 100 mg by mouth in the morning. (0800) 100 mg + 25 mg=125 mg    [provider]  sertraline  (ZOLOFT ) 25 MG tablet Take 25 mg by mouth in the morning. (0800) 25 mg + 100 mg=125 mg    [provider]  solifenacin (VESICARE) 5 MG tablet Take 5 mg by mouth in the morning. (0800)    [provider]  thiamine  (VITAMIN B-1) 100 MG tablet Take 1 tablet (100 mg total) by mouth daily. 02/02/23   Verla Glaze, MD  traMADol  (ULTRAM ) 50 MG tablet Take  1 tablet (50 mg total) by mouth every 6 (six) hours as needed for moderate pain (pain score 4-6). 11/06/23   Donaciano Frizzle, MD   Physical Exam: Vitals:   01/08/24 1100 01/08/24 1247 01/08/24 1400 01/08/24 1500  BP: (!) 141/71     Pulse: (!) 59  (!) 59 60  Resp: 14  17 14   Temp:  98 F (36.7 C)    TempSrc:  Oral    SpO2: 99%  100% 98%  Weight:      Height:       Constitutional: appears age appropriate, NAD, calm Eyes: PERRL, lids and conjunctivae normal ENMT: Mucous membranes are moist. Posterior pharynx clear of any exudate or lesions. Age-appropriate dentition. Hearing appropriate Neck: normal, supple, no masses, no thyromegaly Respiratory: clear to auscultation bilaterally, no wheezing, no crackles. Normal respiratory effort. No accessory  muscle use.  Cardiovascular: Regular rate and rhythm, no murmurs / rubs / gallops. No extremity edema. 2+ pedal pulses. No carotid bruits.  Abdomen: + generalized diffused tenderness, no masses palpated, no hepatosplenomegaly. Bowel sounds positive.  Musculoskeletal: no clubbing / cyanosis. No joint deformity upper and lower extremities. Good ROM, no contractures, no atrophy. Normal muscle tone.  Skin: no rashes, lesions, ulcers. No induration Neurologic: Sensation intact. Strength 5/5 in all 4.  Psychiatric: Normal judgment and insight. Alert and oriented x 3. Normal mood.   EKG: independently reviewed, showing junctional rhythm with rate of 62, QTc 562  Chest x-ray on Admission: not indicated at this time  CT ABDOMEN PELVIS WO CONTRAST Result Date: 01/08/2024 CLINICAL DATA:  Rectal bleeding and abdominal pain. EXAM: CT ABDOMEN AND PELVIS WITHOUT CONTRAST TECHNIQUE: Multidetector CT imaging of the abdomen and pelvis was performed following the standard protocol without IV contrast. RADIATION DOSE REDUCTION: This exam was performed according to the departmental dose-optimization program which includes automated exposure control, adjustment of the mA and/or kV according to patient size and/or use of iterative reconstruction technique. COMPARISON:  10/28/2023 FINDINGS: Lower chest: Stable scarring at the left lung base peer Hepatobiliary: No focal liver abnormality is seen. Status post cholecystectomy. No biliary dilatation. Pancreas: Stable atrophic pancreas. Spleen: Normal in size without focal abnormality. Adrenals/Urinary Tract: No adrenal masses. No renal calculi. Mild dilatation of the right renal pelvis and proximal ureter without evidence renal calculi. Moderate distension of the bladder. Stomach/Bowel: No evidence of bowel obstruction, ileus or free intraperitoneal air. No acute inflammatory process identified. The appendix is not visualized and presumably surgically absent. Vascular/Lymphatic:  Stable atherosclerosis of the abdominal aorta without aneurysm. No enlarged lymph nodes identified in the abdomen or pelvis. Reproductive: Status post hysterectomy. No adnexal masses. Other: No abdominal wall hernia or abnormality. No abdominopelvic ascites. Musculoskeletal: Stable evidence prior posterior lumbar fusion at the L4-5 and L5-S1 levels. Stable compression deformities at T12 and L1 with prior vertebral augmentation. IMPRESSION: 1. No acute findings in the abdomen or pelvis. 2. Mild dilatation of the right renal pelvis and proximal ureter without evidence of renal calculi. Moderate distension of the bladder. 3. Stable atherosclerosis of the abdominal aorta without aneurysm. 4. Stable compression deformities at T12 and L1 with prior vertebral augmentation. 5. Status post cholecystectomy and hysterectomy. Electronically Signed   By: Erica Hau M.D.   On: 01/08/2024 10:45   Labs on Admission: I have personally reviewed following labs  CBC: Recent Labs  Lab 01/08/24 0822 01/08/24 1224  WBC 3.2* 3.2*  NEUTROABS 1.5* 1.5*  HGB 12.4 12.2  HCT 40.1 39.5  MCV 102.3* 101.3*  PLT 143* 156   Basic Metabolic Panel: Recent Labs  Lab 01/08/24 0822  NA 140  K 4.4  CL 104  CO2 25  GLUCOSE 181*  BUN 31*  CREATININE 1.00  CALCIUM  8.8*   GFR: Estimated Creatinine Clearance: 39.8 mL/min (by C-G formula based on SCr of 1 mg/dL).  Liver Function Tests: Recent Labs  Lab 01/08/24 0822  AST 70*  ALT 66*  ALKPHOS 184*  BILITOT 1.1  PROT 7.0  ALBUMIN  3.5   Recent Labs  Lab 01/08/24 0822  LIPASE 23   Urine analysis:    Component Value Date/Time   COLORURINE YELLOW (A) 01/08/2024 0822   APPEARANCEUR CLEAR (A) 01/08/2024 0822   APPEARANCEUR Hazy 10/29/2012 1534   LABSPEC 1.009 01/08/2024 0822   LABSPEC 1.010 10/29/2012 1534   PHURINE 5.0 01/08/2024 0822   GLUCOSEU 50 (A) 01/08/2024 0822   GLUCOSEU Negative 10/29/2012 1534   HGBUR NEGATIVE 01/08/2024 0822   BILIRUBINUR  NEGATIVE 01/08/2024 0822   BILIRUBINUR Negative 10/29/2012 1534   KETONESUR NEGATIVE 01/08/2024 0822   PROTEINUR NEGATIVE 01/08/2024 0822   NITRITE NEGATIVE 01/08/2024 0822   LEUKOCYTESUR NEGATIVE 01/08/2024 0822   LEUKOCYTESUR Negative 10/29/2012 1534   This document was prepared using Dragon Voice Recognition software and may include unintentional dictation errors.  Dr. Reinhold Carbine [facility]  If 7PM-7AM, please contact overnight-coverage provider If 7AM-7PM, please contact day attending provider [email or web address]  01/08/2024, 3:47 PM

## 2024-01-09 DIAGNOSIS — Z794 Long term (current) use of insulin: Secondary | ICD-10-CM | POA: Diagnosis not present

## 2024-01-09 DIAGNOSIS — Z8719 Personal history of other diseases of the digestive system: Secondary | ICD-10-CM

## 2024-01-09 DIAGNOSIS — R197 Diarrhea, unspecified: Secondary | ICD-10-CM

## 2024-01-09 DIAGNOSIS — K921 Melena: Secondary | ICD-10-CM | POA: Diagnosis present

## 2024-01-09 DIAGNOSIS — F01B0 Vascular dementia, moderate, without behavioral disturbance, psychotic disturbance, mood disturbance, and anxiety: Secondary | ICD-10-CM | POA: Diagnosis present

## 2024-01-09 DIAGNOSIS — F3340 Major depressive disorder, recurrent, in remission, unspecified: Secondary | ICD-10-CM | POA: Diagnosis present

## 2024-01-09 DIAGNOSIS — R1013 Epigastric pain: Secondary | ICD-10-CM | POA: Diagnosis not present

## 2024-01-09 DIAGNOSIS — E039 Hypothyroidism, unspecified: Secondary | ICD-10-CM

## 2024-01-09 DIAGNOSIS — Z66 Do not resuscitate: Secondary | ICD-10-CM | POA: Diagnosis present

## 2024-01-09 DIAGNOSIS — K625 Hemorrhage of anus and rectum: Secondary | ICD-10-CM | POA: Diagnosis present

## 2024-01-09 DIAGNOSIS — M810 Age-related osteoporosis without current pathological fracture: Secondary | ICD-10-CM | POA: Diagnosis present

## 2024-01-09 DIAGNOSIS — K529 Noninfective gastroenteritis and colitis, unspecified: Secondary | ICD-10-CM

## 2024-01-09 DIAGNOSIS — M109 Gout, unspecified: Secondary | ICD-10-CM | POA: Diagnosis present

## 2024-01-09 DIAGNOSIS — I480 Paroxysmal atrial fibrillation: Secondary | ICD-10-CM | POA: Diagnosis present

## 2024-01-09 DIAGNOSIS — G2581 Restless legs syndrome: Secondary | ICD-10-CM

## 2024-01-09 DIAGNOSIS — I5032 Chronic diastolic (congestive) heart failure: Secondary | ICD-10-CM | POA: Diagnosis present

## 2024-01-09 DIAGNOSIS — R42 Dizziness and giddiness: Secondary | ICD-10-CM

## 2024-01-09 DIAGNOSIS — K219 Gastro-esophageal reflux disease without esophagitis: Secondary | ICD-10-CM | POA: Diagnosis present

## 2024-01-09 DIAGNOSIS — N1831 Chronic kidney disease, stage 3a: Secondary | ICD-10-CM

## 2024-01-09 DIAGNOSIS — I272 Pulmonary hypertension, unspecified: Secondary | ICD-10-CM | POA: Diagnosis present

## 2024-01-09 DIAGNOSIS — I130 Hypertensive heart and chronic kidney disease with heart failure and stage 1 through stage 4 chronic kidney disease, or unspecified chronic kidney disease: Secondary | ICD-10-CM | POA: Diagnosis present

## 2024-01-09 DIAGNOSIS — N184 Chronic kidney disease, stage 4 (severe): Secondary | ICD-10-CM | POA: Diagnosis present

## 2024-01-09 DIAGNOSIS — D123 Benign neoplasm of transverse colon: Secondary | ICD-10-CM | POA: Diagnosis present

## 2024-01-09 DIAGNOSIS — R7401 Elevation of levels of liver transaminase levels: Secondary | ICD-10-CM

## 2024-01-09 DIAGNOSIS — E1149 Type 2 diabetes mellitus with other diabetic neurological complication: Secondary | ICD-10-CM | POA: Diagnosis present

## 2024-01-09 DIAGNOSIS — E1122 Type 2 diabetes mellitus with diabetic chronic kidney disease: Secondary | ICD-10-CM | POA: Diagnosis present

## 2024-01-09 DIAGNOSIS — K641 Second degree hemorrhoids: Secondary | ICD-10-CM | POA: Diagnosis present

## 2024-01-09 DIAGNOSIS — K317 Polyp of stomach and duodenum: Secondary | ICD-10-CM | POA: Diagnosis present

## 2024-01-09 DIAGNOSIS — D62 Acute posthemorrhagic anemia: Secondary | ICD-10-CM | POA: Diagnosis present

## 2024-01-09 DIAGNOSIS — E782 Mixed hyperlipidemia: Secondary | ICD-10-CM | POA: Diagnosis present

## 2024-01-09 LAB — BASIC METABOLIC PANEL WITH GFR
Anion gap: 4 — ABNORMAL LOW (ref 5–15)
BUN: 23 mg/dL (ref 8–23)
CO2: 25 mmol/L (ref 22–32)
Calcium: 8.2 mg/dL — ABNORMAL LOW (ref 8.9–10.3)
Chloride: 106 mmol/L (ref 98–111)
Creatinine, Ser: 1.07 mg/dL — ABNORMAL HIGH (ref 0.44–1.00)
GFR, Estimated: 52 mL/min — ABNORMAL LOW (ref >60)
Glucose, Bld: 102 mg/dL — ABNORMAL HIGH (ref 70–99)
Potassium: 3.9 mmol/L (ref 3.5–5.1)
Sodium: 135 mmol/L (ref 135–145)

## 2024-01-09 LAB — CBC
HCT: 35.6 % — ABNORMAL LOW (ref 36.0–46.0)
Hemoglobin: 11.7 g/dL — ABNORMAL LOW (ref 12.0–15.0)
MCH: 31.8 pg (ref 26.0–34.0)
MCHC: 32.9 g/dL (ref 30.0–36.0)
MCV: 96.7 fL (ref 80.0–100.0)
Platelets: 169 10*3/uL (ref 150–400)
RBC: 3.68 MIL/uL — ABNORMAL LOW (ref 3.87–5.11)
RDW: 14.3 % (ref 11.5–15.5)
WBC: 3.4 10*3/uL — ABNORMAL LOW (ref 4.0–10.5)
nRBC: 0 % (ref 0.0–0.2)

## 2024-01-09 LAB — GLUCOSE, CAPILLARY: Glucose-Capillary: 365 mg/dL — ABNORMAL HIGH (ref 70–99)

## 2024-01-09 MED ORDER — PANTOPRAZOLE SODIUM 40 MG IV SOLR
40.0000 mg | Freq: Two times a day (BID) | INTRAVENOUS | Status: DC
  Administered 2024-01-09 – 2024-01-10 (×4): 40 mg via INTRAVENOUS
  Filled 2024-01-09 (×5): qty 10

## 2024-01-09 MED ORDER — PEG 3350-KCL-NA BICARB-NACL 420 G PO SOLR
4000.0000 mL | Freq: Once | ORAL | Status: AC
  Administered 2024-01-09: 4000 mL via ORAL
  Filled 2024-01-09: qty 4000

## 2024-01-09 NOTE — Evaluation (Addendum)
 Physical Therapy Evaluation Patient Details Name: Deborah Obrien MRN: [id number] DOB: October 18, 1940 Today's Date: 01/09/2024  History of Present Illness  Deborah Obrien is an 82yoF who comes to [facility] after concerns for BRBPR. No other notable functional deficits.  Clinical Impression  Pt familiar to author from prior admission, is agreeable to PT assessment despite acute pain issues. Pt demonstrates near baseline transfers, AMB, and bed mobility with RW, all modI to supervision. Pt reports to feel somewhat better after AMB around unit, elects to go to recliner in anticipation of pending lunch tray. Pt is happy to work with PT while admitted to avoid losing any recent progress made PT and avoid any further decline. Pt does not feel than any OT assessment is needed at this time. Will continue to follow.       If plan is discharge home, recommend the following: A little help with walking and/or transfers   Can travel by private vehicle        Equipment Recommendations None recommended by PT  Recommendations for Other Services       Functional Status Assessment Patient has had a recent decline in their functional status and demonstrates the ability to make significant improvements in function in a reasonable and predictable amount of time.     Precautions / Restrictions        Mobility  Bed Mobility               General bed mobility comments: mod I    Transfers Overall transfer level: Needs assistance Equipment used: Rolling walker (2 wheels) Transfers: Sit to/from Stand Sit to Stand: Modified independent (Device/Increase time)                Ambulation/Gait Ambulation/Gait assistance: Contact guard assist Gait Distance (Feet): 260 Feet Assistive device: Rolling walker (2 wheels) Gait Pattern/deviations: WFL(Within Functional Limits)       General Gait Details: despite pain, is motivated to keep these distances a part of her daily routine as to avoid  deconditioning  Stairs            Wheelchair Mobility     Tilt Bed    Modified Rankin (Stroke Patients Only)       Balance Overall balance assessment: Modified Independent                                           Pertinent Vitals/Pain Pain Assessment Pain Assessment: Faces Pain Location: her side and a headache Pain Intervention(s): Limited activity within patient's tolerance, Monitored during session    Home Living Family/patient expects to be discharged to:: Assisted living [facility])                 Home Equipment: Agricultural consultant (2 wheels);Rollator (4 wheels);Shower seat;Wheelchair - manual;Hospital bed Additional Comments: Publishing rights manager    Prior Function Prior Level of Function : Independent/Modified Independent             Mobility Comments: using rolling walker recently for ambulation in facility, is progressing but remains weak after multuple medical problems in the past year. Pt started using       Extremity/Trunk Assessment                Communication        Cognition Arousal: Alert Behavior During Therapy: WFL for tasks assessed/performed   PT - Cognitive impairments: No apparent  impairments                                 Cueing       General Comments      Exercises     Assessment/Plan    PT Assessment Patient needs continued PT services  PT Problem List Decreased strength;Decreased range of motion;Decreased activity tolerance;Decreased balance;Decreased mobility;Decreased safety awareness;Decreased knowledge of precautions;Decreased knowledge of use of DME       PT Treatment Interventions DME instruction;Gait training;Stair training;Functional mobility training;Therapeutic activities;Therapeutic exercise;Balance training;Neuromuscular re-education;Patient/family education    PT Goals (Current goals can be found in the Care Plan section)  Acute Rehab PT Goals Patient Stated  Goal: avoid a decline PT Goal Formulation: With patient Time For Goal Achievement: 01/23/24 Potential to Achieve Goals: Good    Frequency Min 2X/week     Co-evaluation               AM-PAC PT "6 Clicks" Mobility  Outcome Measure Help needed turning from your back to your side while in a flat bed without using bedrails?: A Little Help needed moving from lying on your back to sitting on the side of a flat bed without using bedrails?: A Little Help needed moving to and from a bed to a chair (including a wheelchair)?: A Little Help needed standing up from a chair using your arms (e.g., wheelchair or bedside chair)?: A Little Help needed to walk in hospital room?: A Little Help needed climbing 3-5 steps with a railing? : A Little 6 Click Score: 18    End of Session Equipment Utilized During Treatment: Gait belt Activity Tolerance: Patient tolerated treatment well;Patient limited by pain Patient left: in chair;with family/visitor present;with call bell/phone within reach;with chair alarm set Nurse Communication: Mobility status PT Visit Diagnosis: Difficulty in walking, not elsewhere classified (R26.2);Other abnormalities of gait and mobility (R26.89)    Time: 9147-8295 PT Time Calculation (min) (ACUTE ONLY): 21 min   Charges:   PT Evaluation $PT Eval Moderate Complexity: 1 Mod PT Treatments $Therapeutic Activity: 8-22 mins PT General Charges $$ ACUTE PT VISIT: 1 Visit    3:43 PM, 01/09/24 Dawn Eth, PT, DPT Physical Therapist - [facility]  [phone] (ASCOM)    Coretha Creswell C 01/09/2024, 3:39 PM

## 2024-01-09 NOTE — Assessment & Plan Note (Signed)
 Creatinine 1.0 with a GFR 56

## 2024-01-09 NOTE — Assessment & Plan Note (Signed)
 Follow up as outpatient.

## 2024-01-09 NOTE — TOC Initial Note (Signed)
 Transition of Care [facility]) - Initial/Assessment Note    Patient Details  Name: Deborah Obrien MRN: [id number] Date of Birth: 1941-08-02  [facility]) CM/SW Contact:    Odilia Bennett, LCSW Phone Number: 01/09/2024, 10:45 AM  Clinical Narrative:  CSW met with patient. No family at bedside. CSW introduced role and explained that discharge planning would be discussed. Patient confirmed she is a resident at [facility]. She stated she receives OT through their in-house provider and she has a rollator at the facility. No further concerns. CSW will continue to follow patient for support and facilitate return to ALF once medically stable. If ALF is unable to transport, patient said her son or his girlfriend likely can.                Expected Discharge Plan: Assisted Living Barriers to Discharge: Continued Medical Work up   Patient Goals and CMS Choice            Expected Discharge Plan and Services     Post Acute Care Choice: Resumption of Svcs/PTA Provider Living arrangements for the past 2 months: Assisted Living Facility                                      Prior Living Arrangements/Services Living arrangements for the past 2 months: Assisted Living Facility Lives with:: Facility Resident Patient language and need for interpreter reviewed:: Yes Do you feel safe going back to the place where you live?: Yes      Need for Family Participation in Patient Care: Yes (Comment) Care giver support system in place?: Yes (comment) Current home services: Home OT, DME Criminal Activity/Legal Involvement Pertinent to Current Situation/Hospitalization: No - Comment as needed  Activities of Daily Living   ADL Screening (condition at time of admission) Independently performs ADLs?: Yes (appropriate for developmental age) Is the patient deaf or have difficulty hearing?: Yes Does the patient have difficulty seeing, even when wearing glasses/contacts?: Yes Does  the patient have difficulty concentrating, remembering, or making decisions?: No  Permission Sought/Granted Permission sought to share information with : Facility Industrial/product designer granted to share information with : Yes, Verbal Permission Granted     Permission granted to share info w AGENCY: [facility]        Emotional Assessment Appearance:: Appears stated age Attitude/Demeanor/Rapport: Engaged, Gracious Affect (typically observed): Accepting, Appropriate, Calm, Pleasant Orientation: : Oriented to Self, Oriented to Place, Oriented to  Time, Oriented to Situation Alcohol  / Substance Use: Not Applicable Psych Involvement: No (comment)  Admission diagnosis:  Blood in stool [K92.1] BRBPR (bright red blood per rectum) [K62.5] Patient Active Problem List   Diagnosis Date Noted   BRBPR (bright red blood per rectum) 01/08/2024   Gastroenteritis 01/08/2024   Hemorrhagic shock (HCC) 10/31/2023   RSV (respiratory syncytial virus pneumonia) 10/31/2023   CKD stage 3a, GFR 45-59 ml/min (HCC) 10/31/2023   Hyperkalemia 10/31/2023   Hyponatremia 10/31/2023   Hypophosphatemia 10/31/2023   Hematoma of right hip 10/28/2023   Acute respiratory failure with hypoxia (HCC) 02/01/2023   Chronic kidney disease, stage 4 (severe) (HCC) 02/01/2023   Paroxysmal atrial fibrillation (HCC) 01/24/2023   Pulmonary hypertension (HCC) 01/22/2023   Metabolic acidosis 01/22/2023   Fall 01/22/2023   Transaminitis 01/21/2023   Hypotension after procedure 01/21/2023   Shock (HCC) 01/21/2023   Closed fracture of right hip (HCC) 01/21/2023  Nausea 01/21/2023   Chronic heart failure with preserved ejection fraction (HCC) 01/01/2023   Restless leg syndrome 05/31/2022   Hypothyroidism 05/30/2022   Acute renal failure superimposed on stage 4 chronic kidney disease (HCC) 05/30/2022   Drop in hemoglobin 05/30/2022   Elevated liver function tests 05/30/2022   History of gout 05/30/2022    Humerus fracture 05/29/2022   Abnormal urinalysis 03/05/2022   Hypoglycemia 03/04/2022   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/02/2022   Anemia in chronic kidney disease 03/02/2022   Congestive heart failure (HCC) 03/02/2022   Osteoporosis 02/13/2020   Primary osteoarthritis of left shoulder 11/10/2019   Uses walker 09/27/2018   UTI (urinary tract infection) 06/14/2018   Type 2 diabetes mellitus with renal manifestations (HCC) 05/02/2018   Chronic anticoagulation 04/09/2018   Cardiac pacemaker in situ 04/09/2018   H/O TIA (transient ischemic attack) and stroke 04/09/2018   CAD (coronary artery disease) 04/09/2018   Hypotension 02/04/2018   Sinus node dysfunction (HCC) 08/24/2017   Dizziness 06/12/2017   Nephrolithiasis 02/22/2016   Diabetic neuropathy with neurologic complication (HCC) 11/30/2015   Recurrent major depressive disorder, in remission (HCC) 08/11/2015   Polyp of stomach and duodenum 07/29/2015   Gastric polyp    Gastro-esophageal reflux disease without esophagitis    Permanent atrial fibrillation (HCC) 06/29/2015   Intestinal obstruction (HCC) 02/20/2015   DDD (degenerative disc disease), lumbar 01/19/2015   Back pain 01/12/2015   Gout 01/12/2015   Intervertebral disc disorder with radiculopathy of lumbar region 09/17/2014   Lumbar radiculitis 09/17/2014   Lumbar stenosis with neurogenic claudication 09/17/2014   Neuritis or radiculitis due to rupture of lumbar intervertebral disc 09/17/2014   Atherosclerosis of both carotid arteries 07/15/2014   Hyperlipemia, mixed 07/15/2014   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   Bilateral carotid artery disease (HCC) 07/15/2014   Body mass index (BMI) of 40.0-44.9 in adult [facility]) 06/21/2014   Mixed urge and stress incontinence 06/25/2013   Nocturia 06/25/2013   Blurring of visual image 04/11/2012   Other visual disturbances 04/11/2012   PCP:  Carlen Chasten, FNP Pharmacy:    [facility] - [place], [place] - [address] [place] [address] Phone: [phone] Fax: [phone]     Social Drivers of Health (SDOH) Social History: SDOH Screenings   Food Insecurity: No Food Insecurity (01/08/2024)  Housing: Low Risk  (01/08/2024)  Transportation Needs: No Transportation Needs (01/08/2024)  Utilities: Not At Risk (01/08/2024)  Financial Resource Strain: High Risk (02/19/2019)   Received from [facility], [facility]  Physical Activity: Unknown (02/19/2019)   Received from [facility], [facility]  Social Connections: Socially Integrated (01/08/2024)  Stress: Stress Concern Present (02/19/2019)   Received from [facility], [facility]  Tobacco Use: Low Risk  (01/08/2024)   SDOH Interventions:     Readmission Risk Interventions    10/30/2023    4:25 PM  Readmission Risk Prevention Plan  Transportation Screening Complete  Medication Review (RN Care Manager) Complete  PCP or Specialist appointment within 3-5 days of discharge Complete  HRI or Home Care Consult Complete  Palliative Care Screening Complete  Skilled Nursing Facility Complete

## 2024-01-09 NOTE — Plan of Care (Signed)
   Problem: Coping: Goal: Level of anxiety will decrease Outcome: Progressing   Problem: Pain Managment: Goal: General experience of comfort will improve and/or be controlled Outcome: Progressing   Problem: Safety: Goal: Ability to remain free from injury will improve Outcome: Progressing

## 2024-01-09 NOTE — Progress Notes (Signed)
 Progress Note   Patient: Deborah Obrien EAV:[id number] DOB: November 22, 1940 DOA: 01/08/2024     0 DOS: the patient was seen and examined on 01/09/2024   Brief hospital course: 83 year old female with history of cholecystectomy, hysterectomy, paroxysmal atrial fibrillation on Eliquis , CAD, anxiety, vascular dementia, presents emergency department from [facility] for chief concerns of rectal bleeding.  Eliquis  held on admission.  4/29.  Patient states she ate a piece of toast this morning and had an upset stomach and almost vomited.  Was having diarrhea with blood prior to coming in.  Patient did see blood in her underwear and also when she wiped.  Blood was bright red.    Assessment and Plan: * Epigastric pain Start IV Protonix .  Switch diet to clear liquid diet for right now.  Send off stool studies since this could be a gastroenteritis since the patient stated she had diarrhea.  GI consulted.  History of gastric polyps.  BRBPR (bright red blood per rectum) Had diarrhea also so this could be gastroenteritis with irritation or hemorrhoidal bleeding.  With hemoglobin holding, less likely diverticular bleeding.  Continue to monitor.  Hold Eliquis .  Dizziness Check orthostatic vital signs.  Paroxysmal atrial fibrillation (HCC) Will hold home Eliquis  on admission due to bright red blood per rectum.  Stroke risk higher being off anticoagulation.  Transaminitis AST and ALT slightly elevated at 70 and 66 respectively  Hypothyroidism Continue levothyroxine   Pulmonary hypertension (HCC) Follow-up as outpatient  Recurrent major depressive disorder, in remission (HCC) Continue Zoloft   Restless leg syndrome Home ropinirole  0.5 mg nightly resumed  CKD stage 3a, GFR 45-59 ml/min (HCC) Creatinine 1.0 with a GFR 56        Subjective: Patient had diarrhea prior to coming in and saw bright red blood per rectum.  Also ate some toast this morning and felt nauseous with epigastric pain.   Patient with a history of gastric polyps.  Physical Exam: Vitals:   01/08/24 2027 01/08/24 2059 01/09/24 0239 01/09/24 0848  BP:  119/61 (!) 122/57 135/69  Pulse:  62 61 60  Resp:  16 16 12   Temp: 98.1 F (36.7 C) 98.3 F (36.8 C) 98.1 F (36.7 C) 98.5 F (36.9 C)  TempSrc: Oral     SpO2:  100% 100% 98%  Weight:      Height:       Physical Exam HENT:     Head: Normocephalic.  Eyes:     General: Lids are normal.     Conjunctiva/sclera: Conjunctivae normal.  Cardiovascular:     Rate and Rhythm: Normal rate and regular rhythm.     Heart sounds: Normal heart sounds, S1 normal and S2 normal.  Pulmonary:     Breath sounds: No decreased breath sounds, wheezing, rhonchi or rales.  Abdominal:     Palpations: Abdomen is soft.     Tenderness: There is no abdominal tenderness.  Musculoskeletal:     Right lower leg: No swelling.     Left lower leg: No swelling.  Skin:    General: Skin is warm.     Comments: Some bruising on extremities.  Neurological:     Mental Status: She is alert and oriented to person, place, and time.     Data Reviewed: Hemoglobin 11.7, creatinine 1.07  Family Communication: Updated patient's son on the phone  Disposition: Status is: Observation Patient had epigastric pain with eating toast today.  Will downgrade diet to full liquid diet for right now.  Send off stool studies  if has any further diarrhea.  Watch hemoglobin.  Consulted GI.  Planned Discharge Destination: Home    Time spent: 28 minutes  Author: Verla Glaze, MD 01/09/2024 1:03 PM  For on call review [email or web address].

## 2024-01-09 NOTE — Assessment & Plan Note (Addendum)
 Start IV Protonix .  Switch diet to clear liquid diet for right now.  Send off stool studies since this could be a gastroenteritis since the patient stated she had diarrhea.  GI consulted.  History of gastric polyps.

## 2024-01-09 NOTE — Consult Note (Signed)
 Deborah Sink, MD [facility]  [address], [address] [place], [place] [address] Phone: [phone] Fax : [phone]  Consultation  Referring Provider:     Dr. Clelia Current Primary Care Physician:  Carlen Chasten, FNP Primary Gastroenterologist:  Dr. Antony Baumgartner         Reason for Consultation:     Rectal bleeding  Date of Admission:  01/08/2024 Date of Consultation:  01/09/2024         HPI:   Deborah Obrien is a 83 y.o. female who has a history of rectal bleeding in the past and states that every time she has bright red blood per rectum it is caused by her polyps in her stomach that are bleeding.  The patient has had multiple EGDs by Dr. Antony Baumgartner in the past with cauterization and excision of her gastric ulcers.  The patient has a history of coronary artery disease and atrial fibrillation on Eliquis .  She also reports that she has an upset stomach and had diarrhea with blood prior to coming in.  She states that she has not had any bleeding since 3 days ago.  The patient's abdominal pain is in the left side of her abdomen in the upper and lower abdomen.  The patient had a GI panel sent off.  The patient most recent blood work has shown:  Component     Latest Ref Rng 11/02/2023 11/03/2023 01/08/2024 01/09/2024  Hemoglobin     12.0 - 15.0 g/dL 7.9 (L)  8.5 (L)  13.0  11.7 (L)   Hemoglobin        12.4    HCT     36.0 - 46.0 % 24.6 (L)   39.5  35.6 (L)   HCT        40.1    MCV     80.0 - 100.0 fL 100.8 (H)   101.3 (H)  96.7   MCV        102.3 (H)     The patient is concerned that her gastric polyps have started bleeding again since she states that all of her episodes of gastric polyp bleeding have caused bright red blood per rectum.  Past Medical History:  Diagnosis Date   Acute respiratory failure (HCC) 04/09/2018   Anemia    Anxiety    Aortic atherosclerosis (HCC)    Bowel obstruction (HCC)    Carotid artery disease (HCC)    Cerebral microvascular disease    Chronic heart failure with  preserved ejection fraction (HFpEF) (HCC)    a.) TTE 04/10/2018: EF 60-65%, mildly dil LA/RA, mod dil RV, mod TR, PASP ; b.) TTE 01/22/2023: EF 60-65%, no rwma, low-nl RV fxn, RVSP 51.2 mmHg. Mild MR. Mod-sev TR.   CKD (chronic kidney disease), stage IV (HCC)    Closed fracture of one rib of right side 05/29/2020   Colon polyp    Complete tear of left rotator cuff 10/16/2014   Coronary artery disease (non-obstructive) 08/25/2016   a.) LHC 08/25/2016:  25% oLM-LM, 20% mLAD, 15% o-pLCx - med mgmt; b.) MV 10/20/2017: no isch/infarct, EF 55-65%   DDD (degenerative disc disease), cervical    a.) s/p ACDF C6-C7   Depression    Diabetic neuropathy (HCC)    Diarrhea 03/04/2022   Displaced intertrochanteric fracture of right femur (HCC)    Fatty liver    Gastritis without bleeding    GERD (gastroesophageal reflux disease)    GI bleed    Gout  Hypertension    Hypothyroidism    Long term current use of amiodarone     Low back pain    history of kyphoplasty t12-l1   Morbid obesity (HCC)    Multiple gastric polyps    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   a.) presurgical PCR (+) 10/10/2023 prior to REMOVAL OF NAIL HARDWARE WITH SCREW EXCHANGE (RIGHT HIP)   On apixaban  therapy    Osteoarthritis    Osteoporosis    PAF (paroxysmal atrial fibrillation) (HCC) 2016   a.) CHA2DS2-VASc = 9 (age x 2, sex, HFpEF, HTN, TIA x 2, vascular disease, T2DM) as of 10/13/2023; b.) s/p DCCV 06/28/2017 (120 J x1), 02/06/2018 (150c J x 1), 07/15/2020 (75 J x ); c.) ardiac rate/rhythm maintained on oral amiodarone ; chronically anticoagulated using apixaban    Presence of permanent cardiac pacemaker 07/01/2015   a.) PPM placed 07/01/2015; b.) device upgraded to dual chamber MDT Azure XT DR MRI SureScan (SN: [id number]) 08/29/2017   Pulmonary hypertension (HCC)    a.) TTE 04/10/2018: PASP 50 mmHg; b.) TTE 01/22/2023: RVSP 51.2 mmHg   Sinus node dysfunction (HCC)    a.) s/p PPM placement 06/2015 --> device  upgraded 08/2017   T2DM (type 2 diabetes mellitus) (HCC)    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 plate & screws    BACK SURGERY  2019   patient describes kyphoplasty for compression fractures, MD referral said fusion   BREAST BIOPSY Right 04/2015   stereo fibroadenomatous change, neg for atypia   BREAST EXCISIONAL BIOPSY Left    neg   CARDIAC CATHETERIZATION Left 08/25/2016   Procedure: Left Heart Cath and Coronary Angiography;  Surgeon: Michelle Aid, MD;  Location: [facility] INVASIVE CV LAB;  Service: Cardiovascular;  Laterality: Left;   CARDIOVERSION N/A 06/28/2017   Procedure: CARDIOVERSION;  Surgeon: Michelle Aid, MD;  Location: [facility] ORS;  Service: Cardiovascular;  Laterality: N/A;   CARDIOVERSION N/A 02/06/2018   Procedure: CARDIOVERSION;  Surgeon: Loyde Rule, MD;  Location: [facility] ENDOSCOPY;  Service: Cardiovascular;  Laterality: N/A;   CARDIOVERSION N/A 07/15/2020   Procedure: CARDIOVERSION;  Surgeon: Hugh Madura, MD;  Location: [facility] ENDOSCOPY;  Service: Cardiovascular;  Laterality: N/A;   CATARACT EXTRACTION, BILATERAL  2012   CHOLECYSTECTOMY     colon blockage  1999   COLON SURGERY  2000   removed 20% of colon. colon had collapsed   COLONOSCOPY  2016   polyps removed 2016   DORSAL COMPARTMENT RELEASE Left 09/14/2016   Procedure: RELEASE DORSAL COMPARTMENT (DEQUERVAIN);  Surgeon: Arlyne Lame, MD;  Location: [facility] ORS;  Service: Orthopedics;  Laterality: Left;   ESOPHAGOGASTRODUODENOSCOPY N/A 01/27/2015    Procedure: ESOPHAGOGASTRODUODENOSCOPY (EGD);  Surgeon: Deveron Fly, MD;  Location: [facility] ENDOSCOPY;  Service: Endoscopy;  Laterality: N/A;   ESOPHAGOGASTRODUODENOSCOPY (EGD) WITH PROPOFOL  N/A 07/24/2015   Procedure: ESOPHAGOGASTRODUODENOSCOPY (EGD) WITH PROPOFOL ;  Surgeon: Deborah Sink, MD;  Location: [facility] ENDOSCOPY;  Service: Endoscopy;  Laterality: N/A;   ESOPHAGOGASTRODUODENOSCOPY (EGD) WITH PROPOFOL  N/A 04/12/2018   Procedure: ESOPHAGOGASTRODUODENOSCOPY (EGD) WITH  PROPOFOL ;  Surgeon: Luke Salaam, MD;  Location: [facility] ENDOSCOPY;  Service: Gastroenterology;  Laterality: N/A;   ESOPHAGOGASTRODUODENOSCOPY (EGD) WITH PROPOFOL  N/A 06/22/2018   Procedure: ESOPHAGOGASTRODUODENOSCOPY (EGD) WITH PROPOFOL ;  Surgeon: [name] Medford, MD;  Location: [facility] ENDOSCOPY;  Service: Endoscopy;  Laterality: N/A;   ESOPHAGOGASTRODUODENOSCOPY (EGD) WITH PROPOFOL  N/A 07/09/2018   Procedure: ESOPHAGOGASTRODUODENOSCOPY (EGD) WITH PROPOFOL  with resection of gastric polyps;  Surgeon: Luke Salaam, MD;  Location: [facility] ENDOSCOPY;  Service: Gastroenterology;  Laterality: N/A;   ESOPHAGOGASTRODUODENOSCOPY (EGD) WITH PROPOFOL  N/A 05/17/2019   Procedure: ESOPHAGOGASTRODUODENOSCOPY (EGD) WITH PROPOFOL ;  Surgeon: Luke Salaam, MD;  Location: [facility] ENDOSCOPY;  Service: Gastroenterology;  Laterality: N/A;   EUS N/A 06/22/2018   Procedure: UPPER ENDOSCOPIC ULTRASOUND (EUS) RADIAL;  Surgeon: [name] Medford, MD;  Location: [facility] ENDOSCOPY;  Service: Endoscopy;  Laterality: N/A;   EYE SURGERY Bilateral    cataract extractions   HARDWARE REMOVAL Right 10/17/2023   Procedure: Right hip removal of nail hardware with screw exchange;  Surgeon: Lorri Rota, MD;  Location: [facility] ORS;  Service: Orthopedics;  Laterality: Right;   HUMERUS IM NAIL Left 06/02/2022   Procedure: INTRAMEDULLARY (IM) NAIL HUMERAL;  Surgeon: Elner Hahn, MD;  Location: [facility] ORS;  Service: Orthopedics;  Laterality: Left;   INTRAMEDULLARY (IM) NAIL INTERTROCHANTERIC Right  01/21/2023   Procedure: INTRAMEDULLARY (IM) NAIL INTERTROCHANTERIC;  Surgeon: Lorri Rota, MD;  Location: [facility] ORS;  Service: Orthopedics;  Laterality: Right;   JOINT REPLACEMENT Bilateral 2014   Bilateral Knee replacement   LEAD REVISION/REPAIR N/A 08/29/2017   Procedure: LEAD REVISION/REPAIR;  Surgeon: Lei Pump, MD;  Location: [facility] INVASIVE CV LAB;  Service: Cardiovascular;  Laterality: N/A;   OOPHORECTOMY     PACEMAKER INSERTION Left 07/01/2015   Procedure: INSERTION PACEMAKER;  Surgeon: Percival Brace, MD;  Location: [facility] ORS;  Service: Cardiovascular;  Laterality: Left;   PACEMAKER REVISION N/A 08/28/2017   Procedure: PACEMAKER REVISION;  Surgeon: Verona Goodwill, MD;  Location: [facility] INVASIVE CV LAB;  Service: Cardiovascular;  Laterality: N/A;   REPLACEMENT TOTAL KNEE BILATERAL     SHOULDER ARTHROSCOPY WITH ROTATOR CUFF REPAIR AND OPEN BICEPS TENODESIS Left 11/21/2019   Procedure: LEFT SHOULDER ARTHROSCOPY WITH DEBRIDEMENT, DECOMPRESSION, ROTATOR CUFF REPAIR AND BICEPS TENOLYSIS;  Surgeon: Elner Hahn, MD;  Location: [facility] ORS;  Service: Orthopedics;  Laterality: Left;   SHOULDER ARTHROSCOPY WITH SUBACROMIAL DECOMPRESSION Left 2013   TEE WITHOUT CARDIOVERSION N/A 06/28/2017   Procedure: TRANSESOPHAGEAL ECHOCARDIOGRAM (TEE);  Surgeon: Michelle Aid, MD;  Location: [facility] ORS;  Service: Cardiovascular;  Laterality: N/A;   TUBAL LIGATION      Prior to Admission medications   Medication Sig Start Date End Date Taking? Authorizing Provider  acetaminophen  (TYLENOL ) 325 MG tablet Take 2 tablets (650 mg total) by mouth every 6 (six) hours as needed for mild pain, moderate pain or fever (or Fever >/= 101). 02/01/23  Yes Wieting, Richard, MD  acetaminophen  (TYLENOL ) 500 MG tablet Take 2 tablets (1,000 mg total) by mouth every 8 (eight) hours. 10/17/23 10/16/24 Yes Lorri Rota, MD  albuterol  (VENTOLIN  HFA) [id number] Base) MCG/ACT inhaler Inhale 2 puffs into the lungs every 6 (six) hours  as needed for wheezing or shortness of breath (cough).   Yes [provider]  allopurinol  (ZYLOPRIM ) 100 MG tablet Take 100 mg by mouth in the morning.   Yes [provider]  amiodarone  (PACERONE ) 200 MG tablet Take 0.5 tablets (100 mg total) by mouth daily. 07/11/23  Yes Riddle, Suzann, NP  apixaban  (ELIQUIS )  2.5 MG TABS tablet Take 1 tablet (2.5 mg total) by mouth 2 (two) times daily. 03/06/22  Yes Alexander, Natalie, DO  Cholecalciferol  (VITAMIN D ) 50 MCG (2000 UT) CAPS Take 2,000 Units by mouth in the morning.   Yes [provider]  CLARITIN 10 MG tablet Take 10 mg by mouth daily. 12/18/23  Yes [provider]  docusate sodium  (COLACE) 100 MG capsule Take 100 mg by mouth 2 (two) times daily.   Yes [provider]  Ergocalciferol  (VITAMIN D2) 50 MCG (2000 UT) TABS Take 1 tablet by mouth daily.   Yes [provider]  famotidine  (PEPCID ) 10 MG tablet Take 10 mg by mouth in the morning.   Yes [provider]  Fluticasone Furoate (FLONASE SENSIMIST NA) Place 1 spray into the nose in the morning.   Yes [provider]  furosemide  (LASIX ) 20 MG tablet Take 1 tablet (20 mg total) by mouth daily. 02/02/23  Yes Wieting, Richard, MD  gabapentin  (NEURONTIN ) 300 MG capsule Take 600 mg by mouth 3 (three) times daily. (0800, 1400 & 2000)   Yes [provider]  ibandronate (BONIVA) 150 MG tablet Take 150 mg by mouth every 30 (thirty) days. Take in the morning with a full glass of water , on an empty stomach, and do not take anything else by mouth or lie down for the next 30 min.   Yes [provider]  LANTUS  100 UNIT/ML injection Inject 0.09 mLs (9 Units total) into the skin at bedtime. Patient taking differently: Inject 19 Units into the skin at bedtime. 02/01/23  Yes Wieting, Richard, MD  levothyroxine  (SYNTHROID ) 75 MCG tablet Take 37.5 mcg by mouth daily before breakfast. (0600)   Yes [provider]  magnesium   hydroxide (MILK OF MAGNESIA) 400 MG/5ML suspension Take 30 mLs by mouth daily as needed (constipation).   Yes [provider]  midodrine  (PROAMATINE ) 5 MG tablet Take 5 mg by mouth 3 (three) times daily with meals.   Yes [provider]  Multiple Vitamin (MULTIVITAMIN WITH MINERALS) TABS tablet Take 1 tablet by mouth daily. 02/02/23  Yes Verla Glaze, MD  nystatin  powder Apply 1 Application topically in the morning and at bedtime. (0800 & 2000) Apply topically to groin   Yes [provider]  ondansetron  (ZOFRAN -ODT) 4 MG disintegrating tablet Take 1 tablet (4 mg total) by mouth every 8 (eight) hours as needed for nausea or vomiting. 10/17/23  Yes Lorri Rota, MD  pantoprazole  (PROTONIX ) 40 MG tablet Take 40 mg by mouth daily. 12/18/23  Yes [provider]  polyethylene glycol (MIRALAX  / GLYCOLAX ) 17 g packet Take 17 g by mouth daily. 02/01/23  Yes Wieting, Richard, MD  Polyvinyl Alcohol -Povidone PF 1.4-0.6 % SOLN Place 1 drop into both eyes in the morning and at bedtime.   Yes [provider]  rOPINIRole  (REQUIP ) 0.5 MG tablet Take 0.5 mg by mouth at bedtime. (2000)   Yes [provider]  senna (SENOKOT) 8.6 MG tablet Take 1 tablet by mouth at bedtime.   Yes [provider]  sertraline  (ZOLOFT ) 100 MG tablet Take 100 mg by mouth in the morning. (0800) 100 mg + 25 mg=125 mg   Yes [provider]  sertraline  (ZOLOFT ) 25 MG tablet Take 25 mg by mouth in the morning. (0800) 25 mg + 100 mg=125 mg   Yes [provider]  solifenacin (VESICARE) 5 MG tablet Take 5 mg by mouth in the morning. (0800)   Yes [provider]  thiamine  (VITAMIN B-1) 100 MG tablet Take 1 tablet (100 mg total) by mouth daily. 02/02/23  Yes Verla Glaze, MD  alum & mag hydroxide-simeth (MAALOX/MYLANTA) 200-200-20 MG/5ML suspension Take 10 mLs by mouth 3 (three) times daily as needed for indigestion or heartburn.    [provider]   estradiol (ESTRACE) 0.1 MG/GM vaginal cream Place 1 Applicatorful vaginally every Tuesday, Thursday, and Saturday at 6 PM. Patient not taking: Reported on 01/08/2024    [provider]  Insulin  Regular Human (NOVOLIN R FLEXPEN RELION) 100 UNIT/ML KwikPen Inject 0-10 Units into the skin as directed. Check blood sugar 3 times daily and inject insulin  per sliding scale: Less than 70=0 units and eat a snack recheck in 15 minutes; 100-120=2 units 121-150=3 units 151-200=4 units 201-250=5 units 251-300=6 units 301-350=7 units 351-400=8 units 401-450=9 units 451-500=10 units; call MD for reading greater than 500. Patient not taking: Reported on 01/08/2024    [provider]  iron  polysaccharides (NIFEREX) 150 MG capsule Take 1 capsule (150 mg total) by mouth daily. 11/07/23   Donaciano Frizzle, MD  nystatin  cream (MYCOSTATIN ) Apply 1 Application topically 2 (two) times daily as needed (rash). Patient not taking: Reported on 01/08/2024    [provider]  traMADol  (ULTRAM ) 50 MG tablet Take 1 tablet (50 mg total) by mouth every 6 (six) hours as needed for moderate pain (pain score 4-6). 11/06/23   Donaciano Frizzle, MD    Family History  Problem Relation Age of Onset   Diabetes Mellitus II Mother    CAD Mother    Heart attack Mother    Cancer Father        skin   Diabetes Mellitus II Brother    Stroke Brother    Breast cancer Neg Hx      Social History   Tobacco Use   Smoking status: Never   Smokeless tobacco: Never  Vaping Use   Vaping status: Never Used  Substance Use Topics   Alcohol  use: No   Drug use: No    Allergies as of 01/08/2024 - Review Complete 01/08/2024  Allergen Reaction Noted   Fish allergy Shortness Of Breath 11/26/2015   Macrolides and ketolides Other (See Comments) 01/12/2015   Meperidine Shortness Of Breath, Nausea Only, and Other (See Comments) 01/19/2015   Other Nausea Only, Swelling, Rash, Anaphylaxis, Diarrhea, Shortness Of Breath, and  Other (See Comments) 01/12/2015   Prednisone Anaphylaxis and Other (See Comments) 01/19/2015   Shellfish allergy Anaphylaxis, Shortness Of Breath, Diarrhea, Nausea Only, and Rash 06/29/2015   Sulfa antibiotics Shortness Of Breath, Diarrhea, Nausea Only, and Other (See Comments) 06/25/2013   Sulfacetamide sodium Diarrhea, Nausea Only, Other (See Comments), and Shortness Of Breath 08/12/2015   Telbivudine Other (See Comments) 08/12/2015   Uloric [febuxostat] Anaphylaxis 08/22/2016   Aspirin  Rash and Other (See Comments) 04/07/2014   Celecoxib Other (See Comments) 01/19/2015   Cephalexin Diarrhea, Nausea Only, and Other (See Comments) 01/19/2015   Codeine Diarrhea, Nausea Only, and Other (See Comments) 01/19/2015   Dilaudid  [hydromorphone  hcl] Other (See Comments) 07/03/2018   Erythromycin Diarrhea, Nausea Only, and Other (See Comments) 01/19/2015   Iodinated contrast media Rash and Other (See Comments) 01/19/2015   Lipitor [atorvastatin calcium ] Nausea Only 07/03/2018   Oxycodone  Other (See Comments) 01/19/2015   Aleve [naproxen]  07/03/2018   Atorvastatin Nausea Only and Other (See Comments) 01/19/2015   Cephalosporins Diarrhea, Nausea Only, and Other (See Comments) 01/12/2015   Doxycycline  08/24/2017   Iodine Rash 06/29/2015   Motrin [  ibuprofen]  07/03/2018   Tape Other (See Comments) 06/29/2015   Valdecoxib Swelling and Other (See Comments) 01/19/2015    Review of Systems:    All systems reviewed and negative except where noted in HPI.   Physical Exam:  Vital signs in last 24 hours: Temp:  [98 F (36.7 C)-98.5 F (36.9 C)] 98.5 F (36.9 C) (04/29 0848) Pulse Rate:  [60-62] 60 (04/29 0848) Resp:  [12-19] 12 (04/29 0848) BP: (102-135)/(53-69) 135/69 (04/29 0848) SpO2:  [98 %-100 %] 98 % (04/29 0848)   General:   Pleasant, cooperative in NAD Head:  Normocephalic and atraumatic. Eyes:   No icterus.   Conjunctiva pink. PERRLA. Ears:  Normal auditory acuity. Neck:  Supple; no  masses or thyroidomegaly Lungs: Respirations even and unlabored. Lungs clear to auscultation bilaterally.   No wheezes, crackles, or rhonchi.  Heart:  Regular rate and rhythm;  Without murmur, clicks, rubs or gallops Abdomen:  Soft, nondistended, positive tenderness to 1 finger palpation while flexing the abdominal wall muscles. Normal bowel sounds. No appreciable masses or hepatomegaly.  No rebound or guarding.  Rectal:  Not performed. Msk:  Symmetrical without gross deformities.   Extremities:  Without edema, cyanosis or clubbing. Neurologic:  Alert and oriented x3;  grossly normal neurologically. Skin:  Intact without significant lesions or rashes. Cervical Nodes:  No significant cervical adenopathy. Psych:  Alert and cooperative. Normal affect.  LAB RESULTS: Recent Labs    01/08/24 0822 01/08/24 1224 01/09/24 0547  WBC 3.2* 3.2* 3.4*  HGB 12.4 12.2 11.7*  HCT 40.1 39.5 35.6*  PLT 143* 156 169   BMET Recent Labs    01/08/24 0822 01/09/24 0547  NA 140 135  K 4.4 3.9  CL 104 106  CO2 25 25  GLUCOSE 181* 102*  BUN 31* 23  CREATININE 1.00 1.07*  CALCIUM  8.8* 8.2*   LFT Recent Labs    01/08/24 0822  PROT 7.0  ALBUMIN  3.5  AST 70*  ALT 66*  ALKPHOS 184*  BILITOT 1.1   PT/INR No results for input(s): "LABPROT", "INR" in the last 72 hours.  STUDIES: CT ABDOMEN PELVIS WO CONTRAST Result Date: 01/08/2024 CLINICAL DATA:  Rectal bleeding and abdominal pain. EXAM: CT ABDOMEN AND PELVIS WITHOUT CONTRAST TECHNIQUE: Multidetector CT imaging of the abdomen and pelvis was performed following the standard protocol without IV contrast. RADIATION DOSE REDUCTION: This exam was performed according to the departmental dose-optimization program which includes automated exposure control, adjustment of the mA and/or kV according to patient size and/or use of iterative reconstruction technique. COMPARISON:  10/28/2023 FINDINGS: Lower chest: Stable scarring at the left lung base peer  Hepatobiliary: No focal liver abnormality is seen. Status post cholecystectomy. No biliary dilatation. Pancreas: Stable atrophic pancreas. Spleen: Normal in size without focal abnormality. Adrenals/Urinary Tract: No adrenal masses. No renal calculi. Mild dilatation of the right renal pelvis and proximal ureter without evidence renal calculi. Moderate distension of the bladder. Stomach/Bowel: No evidence of bowel obstruction, ileus or free intraperitoneal air. No acute inflammatory process identified. The appendix is not visualized and presumably surgically absent. Vascular/Lymphatic: Stable atherosclerosis of the abdominal aorta without aneurysm. No enlarged lymph nodes identified in the abdomen or pelvis. Reproductive: Status post hysterectomy. No adnexal masses. Other: No abdominal wall hernia or abnormality. No abdominopelvic ascites. Musculoskeletal: Stable evidence prior posterior lumbar fusion at the L4-5 and L5-S1 levels. Stable compression deformities at T12 and L1 with prior vertebral augmentation. IMPRESSION: 1. No acute findings in the abdomen or pelvis. 2. Mild  dilatation of the right renal pelvis and proximal ureter without evidence of renal calculi. Moderate distension of the bladder. 3. Stable atherosclerosis of the abdominal aorta without aneurysm. 4. Stable compression deformities at T12 and L1 with prior vertebral augmentation. 5. Status post cholecystectomy and hysterectomy. Electronically Signed   By: Erica Hau M.D.   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 y/o female with with abdominal pain that is clearly musculoskeletal and reproducible with flexion of the abdominal wall muscles by just having the patient try to sit up with palpating the abdominal wall lightly.  The patient has also had bright red blood per rectum which she states has always happen when she has bleeding from her gastric polyps and is concerned that her polyps have been bleeding again.  Plan:  The patient will be set up for an EGD and colonoscopy for tomorrow for the rectal bleeding and diarrhea.  This is also due to the patient's history of gastric polyps that have bled in the past.  The patient will be given a prep today and be kept n.p.o. after midnight for the procedure.  The patient has been explained the plan and agrees with it.   Thank you for involving me in the care of this patient.      LOS: 0 days   Deborah Sink, MD, MD. Sylvan Evener 01/09/2024, 2:25 PM,  Pager [phone] 7am-5pm  Check AMION for 5pm -7am coverage and on weekends   Note: This dictation was prepared with Dragon dictation along with smaller phrase technology. Any transcriptional errors that result from this process are unintentional.

## 2024-01-10 ENCOUNTER — Inpatient Hospital Stay: Admitting: Certified Registered"

## 2024-01-10 ENCOUNTER — Encounter: Payer: Self-pay | Admitting: Internal Medicine

## 2024-01-10 ENCOUNTER — Encounter
Admission: EM | Disposition: A | Discharge status: Nursing Home (Includes ICF) | Source: Skilled Nursing Facility | Attending: Osteopathic Medicine

## 2024-01-10 DIAGNOSIS — D123 Benign neoplasm of transverse colon: Secondary | ICD-10-CM

## 2024-01-10 DIAGNOSIS — K317 Polyp of stomach and duodenum: Secondary | ICD-10-CM

## 2024-01-10 DIAGNOSIS — R1013 Epigastric pain: Secondary | ICD-10-CM | POA: Diagnosis not present

## 2024-01-10 DIAGNOSIS — K635 Polyp of colon: Secondary | ICD-10-CM

## 2024-01-10 DIAGNOSIS — K641 Second degree hemorrhoids: Secondary | ICD-10-CM | POA: Diagnosis not present

## 2024-01-10 HISTORY — PX: ESOPHAGOGASTRODUODENOSCOPY: SHX5428

## 2024-01-10 HISTORY — PX: COLONOSCOPY: SHX5424

## 2024-01-10 LAB — GLUCOSE, CAPILLARY
Glucose-Capillary: 165 mg/dL — ABNORMAL HIGH (ref 70–99)
Glucose-Capillary: 376 mg/dL — ABNORMAL HIGH (ref 70–99)

## 2024-01-10 LAB — CBC
HCT: 33.9 % — ABNORMAL LOW (ref 36.0–46.0)
Hemoglobin: 11.1 g/dL — ABNORMAL LOW (ref 12.0–15.0)
MCH: 31.6 pg (ref 26.0–34.0)
MCHC: 32.7 g/dL (ref 30.0–36.0)
MCV: 96.6 fL (ref 80.0–100.0)
Platelets: 161 10*3/uL (ref 150–400)
RBC: 3.51 MIL/uL — ABNORMAL LOW (ref 3.87–5.11)
RDW: 14.1 % (ref 11.5–15.5)
WBC: 3.4 10*3/uL — ABNORMAL LOW (ref 4.0–10.5)
nRBC: 0 % (ref 0.0–0.2)

## 2024-01-10 SURGERY — COLONOSCOPY
Anesthesia: General

## 2024-01-10 MED ORDER — LIDOCAINE HCL (CARDIAC) PF 100 MG/5ML IV SOSY
PREFILLED_SYRINGE | INTRAVENOUS | Status: DC | PRN
  Administered 2024-01-10: 200 mg via INTRAVENOUS

## 2024-01-10 MED ORDER — PROPOFOL 10 MG/ML IV BOLUS
INTRAVENOUS | Status: DC | PRN
  Administered 2024-01-10: 50 mg via INTRAVENOUS
  Administered 2024-01-10: 150 ug/kg/min via INTRAVENOUS

## 2024-01-10 MED ORDER — SODIUM CHLORIDE 0.9 % IV SOLN: INTRAVENOUS | Status: DC

## 2024-01-10 NOTE — Transfer of Care (Signed)
 Immediate Anesthesia Transfer of Care Note  Patient: Deborah Obrien  Procedure(s) Performed: COLONOSCOPY EGD (ESOPHAGOGASTRODUODENOSCOPY)  Patient Location: PACU  Anesthesia Type:General  Level of Consciousness: awake and patient cooperative  Airway & Oxygen Therapy: Patient Spontanous Breathing and Patient connected to nasal cannula oxygen  Post-op Assessment: Report given to RN and Post -op Vital signs reviewed and stable  Post vital signs: stable  Last Vitals:  Vitals Value Taken Time  BP    Temp    Pulse 59 01/10/24 1345  Resp    SpO2 98 % 01/10/24 1345  Vitals shown include unfiled device data.  Last Pain:  Vitals:   01/10/24 1229  TempSrc: Temporal  PainSc:          Complications: No notable events documented.

## 2024-01-10 NOTE — Anesthesia Preprocedure Evaluation (Addendum)
 Anesthesia Evaluation  Patient identified by MRN, date of birth, ID band Patient awake    Reviewed: Allergy & Precautions, NPO status , Patient's Chart, lab work & pertinent test results  History of Anesthesia Complications (+) PROLONGED EMERGENCE and history of anesthetic complications  Airway Mallampati: III  TM Distance: <3 FB Neck ROM: full    Dental  (+) Chipped, Poor Dentition, Missing, Upper Dentures   Pulmonary neg shortness of breath   Pulmonary exam normal        Cardiovascular Exercise Tolerance: Good hypertension, + CAD and +CHF  Normal cardiovascular exam+ pacemaker      Neuro/Psych  PSYCHIATRIC DISORDERS      TIA Neuromuscular disease    GI/Hepatic Neg liver ROS,GERD  Controlled,,  Endo/Other  diabetes, Type 2Hypothyroidism    Renal/GU Renal disease  negative genitourinary   Musculoskeletal   Abdominal   Peds  Hematology negative hematology ROS (+)   Anesthesia Other Findings Patient is NPO appropriate and reports no nausea or vomiting today.  Past Medical History: 04/09/2018: Acute respiratory failure (HCC) No date: Anemia No date: Anxiety No date: Aortic atherosclerosis (HCC) No date: Bowel obstruction (HCC) No date: Carotid artery disease (HCC) No date: Cerebral microvascular disease No date: Chronic heart failure with preserved ejection fraction  (HFpEF) (HCC)     Comment:  a.) TTE 04/10/2018: EF 60-65%, mildly dil LA/RA, mod dil              RV, mod TR, PASP ; b.) TTE 01/22/2023: EF 60-65%,               no rwma, low-nl RV fxn, RVSP 51.2 mmHg. Mild MR. Mod-sev               TR. No date: CKD (chronic kidney disease), stage IV (HCC) 05/29/2020: Closed fracture of one rib of right side No date: Colon polyp 10/16/2014: Complete tear of left rotator cuff 08/25/2016: Coronary artery disease (non-obstructive)     Comment:  a.) LHC 08/25/2016:  25% oLM-LM, 20% mLAD, 15% o-pLCx -                med mgmt; b.) MV 10/20/2017: no isch/infarct, EF 55-65% No date: DDD (degenerative disc disease), cervical     Comment:  a.) s/p ACDF C6-C7 No date: Depression No date: Diabetic neuropathy (HCC) 03/04/2022: Diarrhea No date: Displaced intertrochanteric fracture of right femur (HCC) No date: Fatty liver No date: Gastritis without bleeding No date: GERD (gastroesophageal reflux disease) No date: GI bleed No date: Gout No date: Hypertension No date: Hypothyroidism No date: Long term current use of amiodarone  No date: Low back pain     Comment:  history of kyphoplasty t12-l1 No date: Morbid obesity (HCC) No date: Multiple gastric polyps 10/10/2023: Nose colonized with MRSA     Comment:  a.) presurgical PCR (+) 10/10/2023 prior to REMOVAL OF               NAIL HARDWARE WITH SCREW EXCHANGE (RIGHT HIP) No date: On apixaban  therapy No date: Osteoarthritis No date: Osteoporosis 2016: PAF (paroxysmal atrial fibrillation) (HCC)     Comment:  a.) CHA2DS2-VASc = 9 (age x 2, sex, HFpEF, HTN, TIA x 2,              vascular disease, T2DM) as of 10/13/2023; b.) s/p DCCV               06/28/2017 (120 J x1), 02/06/2018 (150c J x 1),  07/15/2020 (75 J x ); c.) ardiac rate/rhythm maintained               on oral amiodarone ; chronically anticoagulated using               apixaban  07/01/2015: Presence of permanent cardiac pacemaker     Comment:  a.) PPM placed 07/01/2015; b.) device upgraded to dual               chamber MDT Azure XT DR MRI SureScan (SN: [id number])               08/29/2017 No date: Pulmonary hypertension (HCC)     Comment:  a.) TTE 04/10/2018: PASP 50 mmHg; b.) TTE 01/22/2023:               RVSP 51.2 mmHg No date: Sinus node dysfunction (HCC)     Comment:  a.) s/p PPM placement 06/2015 --> device upgraded               08/2017 No date: T2DM (type 2 diabetes mellitus) (HCC) 11/10/2019: Tendinitis of upper biceps tendon of left shoulder No date: TIA (transient  ischemic attack) 05/02/2018: Type 2 diabetes mellitus with neurological complications  (HCC) 03/04/2022: Uncontrolled type 2 diabetes mellitus with hypoglycemia,  with long-term current use of insulin  (HCC) 10/17/2022: Urinary incontinence No date: Vascular dementia, moderate, with anxiety (HCC)  Past Surgical History: 1980: ABDOMINAL HYSTERECTOMY No date: APPENDECTOMY No date: BACK SURGERY     Comment:  2008. plate & screws  2019: BACK SURGERY     Comment:  patient describes kyphoplasty for compression fractures,              MD referral said fusion 04/2015: BREAST BIOPSY; Right     Comment:  stereo fibroadenomatous change, neg for atypia No date: BREAST EXCISIONAL BIOPSY; Left     Comment:  neg 08/25/2016: CARDIAC CATHETERIZATION; Left     Comment:  Procedure: Left Heart Cath and Coronary Angiography;                Surgeon: Michelle Aid, MD;  Location: [facility] INVASIVE               CV LAB;  Service: Cardiovascular;  Laterality: Left; 06/28/2017: CARDIOVERSION; N/A     Comment:  Procedure: CARDIOVERSION;  Surgeon: Michelle Aid,               MD;  Location: [facility] ORS;  Service: Cardiovascular;                Laterality: N/A; 02/06/2018: CARDIOVERSION; N/A     Comment:  Procedure: CARDIOVERSION;  Surgeon: Loyde Rule, MD;              Location: [facility] ENDOSCOPY;  Service: Cardiovascular;                Laterality: N/A; 07/15/2020: CARDIOVERSION; N/A     Comment:  Procedure: CARDIOVERSION;  Surgeon: Hugh Madura, MD;               Location: [facility] ENDOSCOPY;  Service: Cardiovascular;                Laterality: N/A; 2012: CATARACT EXTRACTION, BILATERAL No date: CHOLECYSTECTOMY 1999: colon blockage 2000: COLON SURGERY     Comment:  removed 20% of colon. colon had collapsed 2016: COLONOSCOPY     Comment:  polyps removed 2016 09/14/2016: DORSAL COMPARTMENT RELEASE; Left     Comment:  Procedure: RELEASE DORSAL COMPARTMENT (DEQUERVAIN);  Surgeon: Arlyne Lame, MD;  Location: [facility] ORS;                Service: Orthopedics;  Laterality: Left; 01/27/2015: ESOPHAGOGASTRODUODENOSCOPY; N/A     Comment:  Procedure: ESOPHAGOGASTRODUODENOSCOPY (EGD);  Surgeon:               Deveron Fly, MD;  Location: [facility] ENDOSCOPY;                Service: Endoscopy;  Laterality: N/A; 07/24/2015: ESOPHAGOGASTRODUODENOSCOPY (EGD) WITH PROPOFOL ; N/A     Comment:  Procedure: ESOPHAGOGASTRODUODENOSCOPY (EGD) WITH               PROPOFOL ;  Surgeon: Marnee Sink, MD;  Location: [facility]               ENDOSCOPY;  Service: Endoscopy;  Laterality: N/A; 04/12/2018: ESOPHAGOGASTRODUODENOSCOPY (EGD) WITH PROPOFOL ; N/A     Comment:  Procedure: ESOPHAGOGASTRODUODENOSCOPY (EGD) WITH               PROPOFOL ;  Surgeon: Luke Salaam, MD;  Location: [facility]               ENDOSCOPY;  Service: Gastroenterology;  Laterality: N/A; 06/22/2018: ESOPHAGOGASTRODUODENOSCOPY (EGD) WITH PROPOFOL ; N/A     Comment:  Procedure: ESOPHAGOGASTRODUODENOSCOPY (EGD) WITH               PROPOFOL ;  Surgeon: [name] Medford, MD;  Location: [facility]               ENDOSCOPY;  Service: Endoscopy;  Laterality: N/A; 07/09/2018: ESOPHAGOGASTRODUODENOSCOPY (EGD) WITH PROPOFOL ; N/A     Comment:  Procedure: ESOPHAGOGASTRODUODENOSCOPY (EGD) WITH               PROPOFOL  with resection of gastric polyps;  Surgeon:               Luke Salaam, MD;  Location: [facility] ENDOSCOPY;  Service:               Gastroenterology;  Laterality: N/A; 05/17/2019: ESOPHAGOGASTRODUODENOSCOPY (EGD) WITH PROPOFOL ; N/A     Comment:  Procedure: ESOPHAGOGASTRODUODENOSCOPY (EGD) WITH               PROPOFOL ;  Surgeon: Luke Salaam, MD;  Location: [facility]               ENDOSCOPY;  Service: Gastroenterology;  Laterality: N/A; 06/22/2018: EUS; N/A     Comment:  Procedure: UPPER ENDOSCOPIC ULTRASOUND (EUS) RADIAL;                Surgeon: [name] Medford, MD;  Location: [facility] ENDOSCOPY;                Service: Endoscopy;  Laterality: N/A; No date: EYE SURGERY;  Bilateral     Comment:  cataract extractions 10/17/2023: HARDWARE REMOVAL; Right     Comment:  Procedure: Right hip removal of nail hardware with screw              exchange;  Surgeon: Lorri Rota, MD;  Location: [facility]               ORS;  Service: Orthopedics;  Laterality: Right; 06/02/2022: HUMERUS IM NAIL; Left     Comment:  Procedure: INTRAMEDULLARY (IM) NAIL HUMERAL;  Surgeon:               Elner Hahn, MD;  Location: [facility] ORS;  Service:               Orthopedics;  Laterality:  Left; 01/21/2023: INTRAMEDULLARY (IM) NAIL INTERTROCHANTERIC; Right     Comment:  Procedure: INTRAMEDULLARY (IM) NAIL INTERTROCHANTERIC;                Surgeon: Lorri Rota, MD;  Location: [facility] ORS;  Service:              Orthopedics;  Laterality: Right; 2014: JOINT REPLACEMENT; Bilateral     Comment:  Bilateral Knee replacement 08/29/2017: LEAD REVISION/REPAIR; N/A     Comment:  Procedure: LEAD REVISION/REPAIR;  Surgeon: Lei Pump, MD;  Location: [facility] INVASIVE CV LAB;  Service:               Cardiovascular;  Laterality: N/A; No date: OOPHORECTOMY 07/01/2015: PACEMAKER INSERTION; Left     Comment:  Procedure: INSERTION PACEMAKER;  Surgeon: Percival Brace, MD;  Location: [facility] ORS;  Service:               Cardiovascular;  Laterality: Left; 08/28/2017: PACEMAKER REVISION; N/A     Comment:  Procedure: PACEMAKER REVISION;  Surgeon: Verona Goodwill, MD;  Location: [facility] INVASIVE CV LAB;  Service:               Cardiovascular;  Laterality: N/A; No date: REPLACEMENT TOTAL KNEE BILATERAL 11/21/2019: SHOULDER ARTHROSCOPY WITH ROTATOR CUFF REPAIR AND OPEN  BICEPS TENODESIS; Left     Comment:  Procedure: LEFT SHOULDER ARTHROSCOPY WITH DEBRIDEMENT,               DECOMPRESSION, ROTATOR CUFF REPAIR AND BICEPS TENOLYSIS;               Surgeon: Elner Hahn, MD;  Location: [facility] ORS;                Service: Orthopedics;  Laterality: Left; 2013: SHOULDER ARTHROSCOPY  WITH SUBACROMIAL DECOMPRESSION; Left 06/28/2017: TEE WITHOUT CARDIOVERSION; N/A     Comment:  Procedure: TRANSESOPHAGEAL ECHOCARDIOGRAM (TEE);                Surgeon: Michelle Aid, MD;  Location: [facility] ORS;                Service: Cardiovascular;  Laterality: N/A; No date: TUBAL LIGATION  BMI    Body Mass Index: 27.10 kg/m      Reproductive/Obstetrics negative OB ROS                             Anesthesia Physical Anesthesia Plan  ASA: 3  Anesthesia Plan: General   Post-op Pain Management:    Induction: Intravenous  PONV Risk Score and Plan: Propofol  infusion and TIVA  Airway Management Planned: Natural Airway and Nasal Cannula  Additional Equipment:   Intra-op Plan:   Post-operative Plan:   Informed Consent: I have reviewed the patients History and Physical, chart, labs and discussed the procedure including the risks, benefits and alternatives for the proposed anesthesia with the patient or authorized representative who has indicated his/her understanding and acceptance.   Patient has DNR.  Discussed DNR with patient, Discussed DNR with power of attorney and Suspend DNR.   Dental Advisory Given  Plan Discussed with: Anesthesiologist, CRNA and Surgeon  Anesthesia Plan Comments: (History and phone consent from the patients son Eponine Koeller  at [phone]  Patient and son consented for risks of anesthesia including but not limited to:  - adverse reactions to medications - risk of airway placement if required - damage to eyes, teeth, lips or other oral mucosa - nerve damage due to positioning  - sore throat or hoarseness - Damage to heart, brain, nerves, lungs, other parts of body or loss of life  They voiced understanding and assent.)       Anesthesia Quick Evaluation

## 2024-01-10 NOTE — Op Note (Signed)
 [facility] Gastroenterology Patient Name: Deborah Obrien Procedure Date: 01/10/2024 1:06 PM MRN: [id number] Account #: [id number] Date of Birth: September 06, 1941 Admit Type: Outpatient Age: 83 Room: [facility] ENDO ROOM 1 Gender: Female Note Status: Finalized Instrument Name: Hyman Main [id number] Procedure:             Colonoscopy Indications:           Hematochezia Providers:             Marnee Sink MD, MD Medicines:             Propofol  per Anesthesia Complications:         No immediate complications. Procedure:             Pre-Anesthesia Assessment:                        - Prior to the procedure, a History and Physical was                         performed, and patient medications and allergies were                         reviewed. The patient's tolerance of previous                         anesthesia was also reviewed. The risks and benefits                         of the procedure and the sedation options and risks                         were discussed with the patient. All questions were                         answered, and informed consent was obtained. Prior                         Anticoagulants: The patient has taken no anticoagulant                         or antiplatelet agents. ASA Grade Assessment: II - A                         patient with mild systemic disease. After reviewing                         the risks and benefits, the patient was deemed in                         satisfactory condition to undergo the procedure.                        After obtaining informed consent, the colonoscope was                         passed under direct vision. Throughout the procedure,                         the patient's blood pressure, pulse, and oxygen  saturations were monitored continuously. The                         Colonoscope was introduced through the anus and                         advanced to the the cecum, identified by  appendiceal                         orifice and ileocecal valve. The colonoscopy was                         performed without difficulty. The patient tolerated                         the procedure well. The quality of the bowel                         preparation was excellent. Findings:      The perianal and digital rectal examinations were normal.      There was evidence of a prior end-to-side colo-colonic anastomosis in       the sigmoid colon. This was patent and was characterized by healthy       appearing mucosa.      A 4 mm polyp was found in the transverse colon. The polyp was sessile.       The polyp was removed with a cold snare. Resection and retrieval were       complete.      Non-bleeding internal hemorrhoids were found during retroflexion. The       hemorrhoids were Grade II (internal hemorrhoids that prolapse but reduce       spontaneously). Impression:            - Patent end-to-side colo-colonic anastomosis,                         characterized by healthy appearing mucosa.                        - One 4 mm polyp in the transverse colon, removed with                         a cold snare. Resected and retrieved.                        - Non-bleeding internal hemorrhoids. Recommendation:        - Return patient to hospital ward for ongoing care.                        - Resume previous diet.                        - Continue present medications.                        - Await pathology results. Procedure Code(s):     --- Professional ---                        [phone], Colonoscopy, flexible; with removal of  tumor(s), polyp(s), or other lesion(s) by snare                         technique Diagnosis Code(s):     --- Professional ---                        Z98.0, Intestinal bypass and anastomosis status                        D12.3, Benign neoplasm of transverse colon (hepatic                         flexure or splenic flexure) CPT copyright 2022  American Medical Association. All rights reserved. The codes documented in this report are preliminary and upon coder review may  be revised to meet current compliance requirements. Marnee Sink MD, MD 01/10/2024 1:42:18 PM This report has been signed electronically. Number of Addenda: 0 Note Initiated On: 01/10/2024 1:06 PM Scope Withdrawal Time: 0 hours 7 minutes 22 seconds  Total Procedure Duration: 0 hours 13 minutes 58 seconds  Estimated Blood Loss:  Estimated blood loss: none.      [facility]

## 2024-01-10 NOTE — Op Note (Signed)
 [facility] Gastroenterology Patient Name: Deborah Obrien Procedure Date: 01/10/2024 1:06 PM MRN: [id number] Account #: [id number] Date of Birth: [birth year] Admit Type: Outpatient Age: 83 Room: [facility] ENDO ROOM 1 Gender: Female Note Status: Finalized Instrument Name: Cristino Donna Endoscope [id number] Procedure:             Upper GI endoscopy Indications:           Acute post hemorrhagic anemia Providers:             Marnee Sink MD, MD Medicines:             Propofol  per Anesthesia Complications:         No immediate complications. Procedure:             Pre-Anesthesia Assessment:                        - Prior to the procedure, a History and Physical was                         performed, and patient medications and allergies were                         reviewed. The patient's tolerance of previous                         anesthesia was also reviewed. The risks and benefits                         of the procedure and the sedation options and risks                         were discussed with the patient. All questions were                         answered, and informed consent was obtained. Prior                         Anticoagulants: The patient has taken no anticoagulant                         or antiplatelet agents. ASA Grade Assessment: II - A                         patient with mild systemic disease. After reviewing                         the risks and benefits, the patient was deemed in                         satisfactory condition to undergo the procedure.                        After obtaining informed consent, the endoscope was                         passed under direct vision. Throughout the procedure,                         the  patient's blood pressure, pulse, and oxygen                         saturations were monitored continuously. The Endoscope                         was introduced through the mouth, and advanced to the                         second  part of duodenum. The upper GI endoscopy was                         accomplished without difficulty. The patient tolerated                         the procedure well. Findings:      The examined esophagus was normal.      A few sessile polyps with no bleeding and no stigmata of recent bleeding       were found in the gastric body.      The examined duodenum was normal. Impression:            - Normal esophagus.                        - A few gastric polyps.                        - Normal examined duodenum.                        - No specimens collected. Recommendation:        - Return patient to hospital ward for ongoing care.                        - Resume previous diet.                        - Continue present medications.                        - Perform a colonoscopy today. Procedure Code(s):     --- Professional ---                        [phone], Esophagogastroduodenoscopy, flexible,                         transoral; diagnostic, including collection of                         specimen(s) by brushing or washing, when performed                         (separate procedure) Diagnosis Code(s):     --- Professional ---                        D62, Acute posthemorrhagic anemia CPT copyright 2022 American Medical Association. All rights reserved. The codes documented in this report are preliminary and upon coder review may  be revised to meet current compliance requirements. Marnee Sink MD, MD 01/10/2024 1:23:42  PM This report has been signed electronically. Number of Addenda: 0 Note Initiated On: 01/10/2024 1:06 PM Estimated Blood Loss:  Estimated blood loss: none.      [facility]

## 2024-01-10 NOTE — Progress Notes (Signed)
 PROGRESS NOTE    Deborah Obrien   ZOX:[id number] DOB: 01/27/1941  DOA: 01/08/2024 Date of Service: 01/10/24 which is hospital day 1  PCP: Carlen Chasten, [facility] course / significant events:   HPI: 83 year old female with history of cholecystectomy, hysterectomy, paroxysmal atrial fibrillation on Eliquis , CAD, anxiety, vascular dementia, presents emergency department from [facility] for chief concerns of rectal bleeding, diarrhea with blood  04/28: admitted to hospitalist for rectal bleeding. Eliquis  held. 04/29.  Patient states she ate a piece of toast this morning and had an upset stomach and almost vomited.  Still noting BRBPR on wiping. GI consult: abdominal pain is musculoskeletal (reproducible with flexion of the abdominal wall muscles by just having the patient try to sit up with palpating the abdominal wall lightly). Given BRBPR, will plan for EGD/colonoscopy tomorrow  04/30: EGD --> polyps, no bleeding. Colonoscopy --> one polyp removes, no bleeding, Likely hemorrhoids as the cause of the BRBPR. Per GI ok to DC no further procedures. Diet ordered   Consultants:  GI  Procedures/Surgeries: 01/10/24 EGD and colonoscopy      ASSESSMENT & PLAN:   Epigastric pain BRBPR (bright red blood per rectum) likely d/t hemorrhoidal S/p EGD and colonoscopy today no bleeding or old bloos Continue to monitor but no significant bleeding.   Hold Eliquis .   Paroxysmal atrial fibrillation  Will hold home Eliquis  on admission due to bright red blood per rectum.  Consider restart outpatient    Transaminitis AST and ALT slightly elevated at 70 and 66 respectively Monitor outpatient    Hypothyroidism Continue levothyroxine    Pulmonary hypertension  Follow-up as outpatient   Recurrent major depressive disorder, in remission  Continue Zoloft    Restless leg syndrome Home ropinirole  0.5 mg nightly resumed   CKD stage 3a, GFR 45-59 ml/min  Creatinine 1.0 with a GFR  56 Monitor BMP    overweight based on BMI: Body mass index is 27.1 kg/m.Aaron Aas Significantly low or high BMI is associated with higher medical risk.  Underweight - under 18  overweight - 25 to 29 obese - 30 or more Class 1 obesity: BMI of 30.0 to 34 Class 2 obesity: BMI of 35.0 to 39 Class 3 obesity: BMI of 40.0 to 49 Super Morbid Obesity: BMI 50-59 Super-super Morbid Obesity: BMI 60+ Healthy nutrition and physical activity advised as adjunct to other disease management and risk reduction treatments    DVT prophylaxis: hold Rx oox IV fluids: no continuous IV fluids  Nutrition: regular diet  Central lines / other devices: none  Code Status: DNR  TOC needs: none Medical barriers to dispo: monitor overnight. Expected medical readiness for discharge tomorrow.              Subjective / Brief ROS:  Patient reports feeling okay following procedure, hasn't eaten but s hungry Denies CP/SOB.  Pain controlled.  Denies new weakness. .  Reports no concerns w/ urination/defecation.   Family Communication: son at bedside on rounds     Objective Findings:  Vitals:   01/10/24 1229 01/10/24 1346 01/10/24 1356 01/10/24 1406  BP: (!) 148/53 (!) 125/56 128/62 (!) 136/58  Pulse: 64 (!) 59 63 60  Resp: 18 11 11 15   Temp: (!) 97.5 F (36.4 C) 97.8 F (36.6 C)    TempSrc: Temporal Temporal    SpO2: 99% 98% 99% 99%  Weight: 69.4 kg     Height: 5\' 3"  (1.6 m)       Intake/Output Summary (Last 24 hours)  at 01/10/2024 1845 Last data filed at 01/10/2024 1500 Gross per 24 hour  Intake 152 ml  Output --  Net 152 ml   Filed Weights   01/08/24 0816 01/10/24 1229  Weight: 69.4 kg 69.4 kg    Examination:  Physical Exam Constitutional:      General: She is not in acute distress.    Appearance: She is not ill-appearing.  Cardiovascular:     Rate and Rhythm: Normal rate and regular rhythm.  Abdominal:     General: Abdomen is flat. Bowel sounds are normal.     Palpations:  Abdomen is soft.  Skin:    General: Skin is warm and dry.  Neurological:     General: No focal deficit present.     Mental Status: She is alert and oriented to person, place, and time.  Psychiatric:        Mood and Affect: Mood normal.        Behavior: Behavior normal.          Scheduled Medications:   allopurinol   100 mg Oral Daily   amiodarone   100 mg Oral Daily   famotidine   10 mg Oral Daily   gabapentin   600 mg Oral TID BM   insulin  glargine-yfgn  19 Units Subcutaneous QHS   levothyroxine   37.5 mcg Oral Q0600   multivitamin with minerals  1 tablet Oral Daily   pantoprazole  (PROTONIX ) IV  40 mg Intravenous Q12H   rOPINIRole   0.5 mg Oral Q2000   sertraline   125 mg Oral Daily   thiamine   100 mg Oral Daily    Continuous Infusions:  sodium chloride  Stopped (01/08/24 0851)    PRN Medications:  acetaminophen  **OR** acetaminophen , ondansetron  **OR** ondansetron  (ZOFRAN ) IV, senna-docusate, traMADol   Antimicrobials from admission:  Anti-infectives (From admission, onward)    None           Data Reviewed:  I have personally reviewed the following...  CBC: Recent Labs  Lab 01/08/24 0822 01/08/24 1224 01/09/24 0547 01/10/24 0552  WBC 3.2* 3.2* 3.4* 3.4*  NEUTROABS 1.5* 1.5*  --   --   HGB 12.4 12.2 11.7* 11.1*  HCT 40.1 39.5 35.6* 33.9*  MCV 102.3* 101.3* 96.7 96.6  PLT 143* 156 169 161   Basic Metabolic Panel: Recent Labs  Lab 01/08/24 0822 01/09/24 0547  NA 140 135  K 4.4 3.9  CL 104 106  CO2 25 25  GLUCOSE 181* 102*  BUN 31* 23  CREATININE 1.00 1.07*  CALCIUM  8.8* 8.2*   GFR: Estimated Creatinine Clearance: 37.2 mL/min (A) (by C-G formula based on SCr of 1.07 mg/dL (H)). Liver Function Tests: Recent Labs  Lab 01/08/24 0822  AST 70*  ALT 66*  ALKPHOS 184*  BILITOT 1.1  PROT 7.0  ALBUMIN  3.5   Recent Labs  Lab 01/08/24 0822  LIPASE 23   No results for input(s): "AMMONIA" in the last 168 hours. Coagulation Profile: No  results for input(s): "INR", "PROTIME" in the last 168 hours. Cardiac Enzymes: No results for input(s): "CKTOTAL", "CKMB", "CKMBINDEX", "TROPONINI" in the last 168 hours. BNP (last 3 results) No results for input(s): "PROBNP" in the last 8760 hours. HbA1C: No results for input(s): "HGBA1C" in the last 72 hours. CBG: Recent Labs  Lab 01/08/24 1655 01/08/24 2110 01/09/24 2146 01/10/24 1052  GLUCAP 127* 74 365* 165*   Lipid Profile: No results for input(s): "CHOL", "HDL", "LDLCALC", "TRIG", "CHOLHDL", "LDLDIRECT" in the last 72 hours. Thyroid  Function Tests: No results for input(s): "TSH", "T4TOTAL", "  FREET4", "T3FREE", "THYROIDAB" in the last 72 hours. Anemia Panel: No results for input(s): "VITAMINB12", "FOLATE", "FERRITIN", "TIBC", "IRON ", "RETICCTPCT" in the last 72 hours. Most Recent Urinalysis On File:     Component Value Date/Time   COLORURINE YELLOW (A) 01/08/2024 0822   APPEARANCEUR CLEAR (A) 01/08/2024 0822   APPEARANCEUR Hazy 10/29/2012 1534   LABSPEC 1.009 01/08/2024 0822   LABSPEC 1.010 10/29/2012 1534   PHURINE 5.0 01/08/2024 0822   GLUCOSEU 50 (A) 01/08/2024 0822   GLUCOSEU Negative 10/29/2012 1534   HGBUR NEGATIVE 01/08/2024 0822   BILIRUBINUR NEGATIVE 01/08/2024 0822   BILIRUBINUR Negative 10/29/2012 1534   KETONESUR NEGATIVE 01/08/2024 0822   PROTEINUR NEGATIVE 01/08/2024 0822   NITRITE NEGATIVE 01/08/2024 0822   LEUKOCYTESUR NEGATIVE 01/08/2024 0822   LEUKOCYTESUR Negative 10/29/2012 1534   Sepsis Labs: @LABRCNTIP (procalcitonin:4,lacticidven:4) Microbiology: No results found for this or any previous visit (from the past 240 hours).    Radiology Studies last 3 days: CT ABDOMEN PELVIS WO CONTRAST Result Date: 01/08/2024 CLINICAL DATA:  Rectal bleeding and abdominal pain. EXAM: CT ABDOMEN AND PELVIS WITHOUT CONTRAST TECHNIQUE: Multidetector CT imaging of the abdomen and pelvis was performed following the standard protocol without IV contrast. RADIATION  DOSE REDUCTION: This exam was performed according to the departmental dose-optimization program which includes automated exposure control, adjustment of the mA and/or kV according to patient size and/or use of iterative reconstruction technique. COMPARISON:  10/28/2023 FINDINGS: Lower chest: Stable scarring at the left lung base peer Hepatobiliary: No focal liver abnormality is seen. Status post cholecystectomy. No biliary dilatation. Pancreas: Stable atrophic pancreas. Spleen: Normal in size without focal abnormality. Adrenals/Urinary Tract: No adrenal masses. No renal calculi. Mild dilatation of the right renal pelvis and proximal ureter without evidence renal calculi. Moderate distension of the bladder. Stomach/Bowel: No evidence of bowel obstruction, ileus or free intraperitoneal air. No acute inflammatory process identified. The appendix is not visualized and presumably surgically absent. Vascular/Lymphatic: Stable atherosclerosis of the abdominal aorta without aneurysm. No enlarged lymph nodes identified in the abdomen or pelvis. Reproductive: Status post hysterectomy. No adnexal masses. Other: No abdominal wall hernia or abnormality. No abdominopelvic ascites. Musculoskeletal: Stable evidence prior posterior lumbar fusion at the L4-5 and L5-S1 levels. Stable compression deformities at T12 and L1 with prior vertebral augmentation. IMPRESSION: 1. No acute findings in the abdomen or pelvis. 2. Mild dilatation of the right renal pelvis and proximal ureter without evidence of renal calculi. Moderate distension of the bladder. 3. Stable atherosclerosis of the abdominal aorta without aneurysm. 4. Stable compression deformities at T12 and L1 with prior vertebral augmentation. 5. Status post cholecystectomy and hysterectomy. Electronically Signed   By: Erica Hau M.D.   On: 01/08/2024 10:45         Correne Lalani, DO [facility] 01/10/2024, 6:45 PM    Dictation software may have been used to  generate the above note. Typos may occur and escape review in typed/dictated notes. Please contact Dr Authur Leghorn directly for clarity if needed.  Staff may message me via secure chat in Epic  but this may not receive an immediate response,  please page me for urgent matters!  If 7PM-7AM, please contact night coverage [email or web address]

## 2024-01-10 NOTE — Inpatient Diabetes Management (Signed)
 Inpatient Diabetes Program Recommendations  AACE/ADA: New Consensus Statement on Inpatient Glycemic Control (2015)  Target Ranges:  Prepandial:   less than 140 mg/dL      Peak postprandial:   less than 180 mg/dL (1-2 hours)      Critically ill patients:  140 - 180 mg/dL   Lab Results  Component Value Date   GLUCAP 365 (H) 01/09/2024   HGBA1C 9.2 (H) 10/10/2023    Review of Glycemic Control  Latest Reference Range & Units 01/09/24 21:46  Glucose-Capillary 70 - 99 mg/dL 161 (H)  (H): Data is abnormally high Diabetes history: Type 2 DM Outpatient Diabetes medications: Lantus  19 units at bedtime, Novolin R 0-10 units TID Current orders for Inpatient glycemic control: Semglee  19 units QHS  Inpatient Diabetes Program Recommendations:    Consider adding Novolog  0-6 units Q4H. Secure chat sent to Md.   Thanks, Marjo Sievert, MSN, RNC-OB Diabetes Coordinator [phone] (8a-5p)<

## 2024-01-10 NOTE — TOC Progression Note (Signed)
 Transition of Care [facility]) - Progression Note    Patient Details  Name: Deborah Obrien MRN: [id number] Date of Birth: Aug 08, 1941  Transition of Care [facility] CM/SW Contact  Odilia Bennett, LCSW Phone Number: 01/10/2024, 12:37 PM  Clinical Narrative:   CSW left voicemail for [facility] executive director to notify her of potential discharge tomorrow.  Expected Discharge Plan: Assisted Living Barriers to Discharge: Continued Medical Work up  Expected Discharge Plan and Services     Post Acute Care Choice: Resumption of Svcs/PTA Provider Living arrangements for the past 2 months: Assisted Living Facility                                       Social Determinants of Health (SDOH) Interventions SDOH Screenings   Food Insecurity: No Food Insecurity (01/08/2024)  Housing: Low Risk  (01/08/2024)  Transportation Needs: No Transportation Needs (01/08/2024)  Utilities: Not At Risk (01/08/2024)  Financial Resource Strain: High Risk (02/19/2019)   Received from [facility], [facility]  Physical Activity: Unknown (02/19/2019)   Received from [facility], [facility]  Social Connections: Socially Integrated (01/08/2024)  Stress: Stress Concern Present (02/19/2019)   Received from [facility], [facility]  Tobacco Use: Low Risk  (01/10/2024)    Readmission Risk Interventions    01/10/2024    9:49 AM 10/30/2023    4:25 PM  Readmission Risk Prevention Plan  Transportation Screening  Complete  Medication Review (RN Care Manager)  Complete  PCP or Specialist appointment within 3-5 days of discharge Complete Complete  HRI or Home Care Consult Complete Complete  SW Recovery Care/Counseling Consult Complete   Palliative Care Screening Not Applicable Complete  Skilled Nursing Facility Not Applicable Complete

## 2024-01-10 NOTE — Plan of Care (Signed)

## 2024-01-11 ENCOUNTER — Encounter: Payer: Self-pay | Admitting: Gastroenterology

## 2024-01-11 DIAGNOSIS — R1013 Epigastric pain: Secondary | ICD-10-CM | POA: Diagnosis not present

## 2024-01-11 LAB — SURGICAL PATHOLOGY

## 2024-01-11 LAB — CBC
HCT: 35.5 % — ABNORMAL LOW (ref 36.0–46.0)
Hemoglobin: 11.7 g/dL — ABNORMAL LOW (ref 12.0–15.0)
MCH: 31.6 pg (ref 26.0–34.0)
MCHC: 33 g/dL (ref 30.0–36.0)
MCV: 95.9 fL (ref 80.0–100.0)
Platelets: 174 10*3/uL (ref 150–400)
RBC: 3.7 MIL/uL — ABNORMAL LOW (ref 3.87–5.11)
RDW: 14 % (ref 11.5–15.5)
WBC: 3.2 10*3/uL — ABNORMAL LOW (ref 4.0–10.5)
nRBC: 0 % (ref 0.0–0.2)

## 2024-01-11 MED ORDER — SENNOSIDES 8.6 MG PO TABS: 1.0000 | ORAL_TABLET | Freq: Every evening | ORAL | Status: AC | PRN

## 2024-01-11 MED ORDER — LANTUS 100 UNIT/ML ~~LOC~~ SOLN
19.0000 [IU] | Freq: Every day | SUBCUTANEOUS | Status: AC
Start: 2024-01-11 — End: ?

## 2024-01-11 MED ORDER — PANTOPRAZOLE SODIUM 40 MG PO TBEC: 40.0000 mg | DELAYED_RELEASE_TABLET | Freq: Two times a day (BID) | ORAL | Status: DC

## 2024-01-11 NOTE — [place] FL2 (Signed)
 [place]  MEDICAID FL2 LEVEL OF CARE FORM     IDENTIFICATION  Patient Name: Deborah Obrien Birthdate: Nov 24, 1940 Sex: female Admission Date (Current Location): 01/08/2024  [facility] and [place] Number:  Chiropodist and Address:  [facility], [address], [place], [place] [address]      Provider Number: [id number]  Attending Physician Name and Address:  Melodi Sprung, DO  Relative Name and Phone Number:       Current Level of Care: Hospital Recommended Level of Care: Assisted Living Facility (with PT and OT) Prior Approval Number:    Date Approved/Denied:   PASRR Number:    Discharge Plan: Other (Comment) (ALF with PT and OT)    Current Diagnoses: Patient Active Problem List   Diagnosis Date Noted   Polyp of transverse colon 01/10/2024   Blood in stool 01/09/2024   BRBPR (bright red blood per rectum) 01/08/2024   Gastroenteritis 01/08/2024   Hemorrhagic shock (HCC) 10/31/2023   RSV (respiratory syncytial virus pneumonia) 10/31/2023   CKD stage 3a, GFR 45-59 ml/min (HCC) 10/31/2023   Hyperkalemia 10/31/2023   Hyponatremia 10/31/2023   Hematoma of right hip 10/28/2023   Acute respiratory failure with hypoxia (HCC) 02/01/2023   Chronic kidney disease, stage 4 (severe) (HCC) 02/01/2023   Paroxysmal atrial fibrillation (HCC) 01/24/2023   Pulmonary hypertension (HCC) 01/22/2023   Metabolic acidosis 01/22/2023   Fall 01/22/2023   Transaminitis 01/21/2023   Hypotension after procedure 01/21/2023   Shock (HCC) 01/21/2023   Closed fracture of right hip (HCC) 01/21/2023   Nausea 01/21/2023   Chronic heart failure with preserved ejection fraction (HCC) 01/01/2023   Restless leg syndrome 05/31/2022   Hypothyroidism 05/30/2022   Acute renal failure superimposed on stage 4 chronic kidney disease (HCC) 05/30/2022   Drop in hemoglobin 05/30/2022   Elevated liver function tests 05/30/2022   History of gout 05/30/2022   Humerus  fracture 05/29/2022   Abnormal urinalysis 03/05/2022   Hypoglycemia 03/04/2022   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/02/2022   Anemia in chronic kidney disease 03/02/2022   Congestive heart failure (HCC) 03/02/2022   Osteoporosis 02/13/2020   Primary osteoarthritis of left shoulder 11/10/2019   Uses walker 09/27/2018   UTI (urinary tract infection) 06/14/2018   Type 2 diabetes mellitus with renal manifestations (HCC) 05/02/2018   Chronic anticoagulation 04/09/2018   Cardiac pacemaker in situ 04/09/2018   H/O TIA (transient ischemic attack) and stroke 04/09/2018   CAD (coronary artery disease) 04/09/2018   Hypotension 02/04/2018   Sinus node dysfunction (HCC) 08/24/2017   Dizziness 06/12/2017   Nephrolithiasis 02/22/2016   Diabetic neuropathy with neurologic complication (HCC) 11/30/2015   Recurrent major depressive disorder, in remission (HCC) 08/11/2015   Polyp of stomach and duodenum 07/29/2015   Gastric polyp    Gastro-esophageal reflux disease without esophagitis    Epigastric pain    Permanent atrial fibrillation (HCC) 06/29/2015   Intestinal obstruction (HCC) 02/20/2015   DDD (degenerative disc disease), lumbar 01/19/2015   Back pain 01/12/2015   Gout 01/12/2015   Intervertebral disc disorder with radiculopathy of lumbar region 09/17/2014   Lumbar radiculitis 09/17/2014   Lumbar stenosis with neurogenic claudication 09/17/2014   Neuritis or radiculitis due to rupture of lumbar intervertebral disc 09/17/2014   Atherosclerosis of both carotid arteries 07/15/2014   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   Bilateral carotid artery disease (HCC) 07/15/2014   Body mass index (BMI) of 40.0-44.9 in adult (HCC) 06/21/2014   Mixed urge and stress  incontinence 06/25/2013   Nocturia 06/25/2013   Blurring of visual image 04/11/2012   Other visual disturbances 04/11/2012    Orientation RESPIRATION BLADDER Height & Weight     Self,  Situation, Time, Place  Normal Incontinent Weight: 153 lb (69.4 kg) Height:  5\' 3"  (160 cm)  BEHAVIORAL SYMPTOMS/MOOD NEUROLOGICAL BOWEL NUTRITION STATUS   (None)   Incontinent Diet (Regular)  AMBULATORY STATUS COMMUNICATION OF NEEDS Skin   Limited Assist Verbally Other (Comment) (Erythema/redness.)                       Personal Care Assistance Level of Assistance  Bathing, Feeding, Dressing Bathing Assistance: Limited assistance Feeding assistance: Limited assistance Dressing Assistance: Limited assistance     Functional Limitations Info  Sight, Hearing, Speech Sight Info: Adequate Hearing Info: Adequate Speech Info: Adequate    SPECIAL CARE FACTORS FREQUENCY  PT (By licensed PT), OT (By licensed OT)     PT Frequency: 3 x week OT Frequency: 3 x week            Contractures Contractures Info: Not present    Additional Factors Info  Code Status, Allergies Code Status Info: DNR Allergies Info: Fish Allergy, Macrolides And Ketolides, Meperidine, Other, Prednisone, Shellfish Allergy, Sulfa Antibiotics, Sulfacetamide Sodium, Telbivudine, Uloric (Febuxostat), Aspirin , Celecoxib, Cephalexin, Codeine, Dilaudid  (Hydromorphone  Hcl), Erythromycin, Iodinated Contrast Media, Lipitor (Atorvastatin Calcium ), Oxycodone , Aleve (Naproxen), Atorvastatin, Cephalosporins, Doxycycline, Iodine, Motrin (Ibuprofen), Tape, Valdecoxib           Current Medications (01/11/2024):  This is the current hospital active medication list Current Facility-Administered Medications  Medication Dose Route Frequency Provider Last Rate Last Admin   acetaminophen  (TYLENOL ) tablet 650 mg  650 mg Oral Q6H PRN Cox, Amy N, DO   650 mg at 01/11/24 1133   Or   acetaminophen  (TYLENOL ) suppository 650 mg  650 mg Rectal Q6H PRN Cox, Amy N, DO       allopurinol  (ZYLOPRIM ) tablet 100 mg  100 mg Oral Daily Cox, Amy N, DO   100 mg at 01/11/24 1005   amiodarone  (PACERONE ) tablet 100 mg  100 mg Oral Daily Cox, Amy N,  DO   100 mg at 01/11/24 1006   famotidine  (PEPCID ) tablet 10 mg  10 mg Oral Daily Cox, Amy N, DO   10 mg at 01/11/24 1006   gabapentin  (NEURONTIN ) capsule 600 mg  600 mg Oral TID BM Cox, Amy N, DO   600 mg at 01/11/24 1006   insulin  glargine-yfgn (SEMGLEE ) injection 19 Units  19 Units Subcutaneous QHS Cox, Amy N, DO   19 Units at 01/10/24 2120   levothyroxine  (SYNTHROID ) tablet 37.5 mcg  37.5 mcg Oral Q0600 Cox, Amy N, DO   37.5 mcg at 01/11/24 0553   multivitamin with minerals tablet 1 tablet  1 tablet Oral Daily Cox, Amy N, DO   1 tablet at 01/11/24 1005   ondansetron  (ZOFRAN ) tablet 4 mg  4 mg Oral Q6H PRN Cox, Amy N, DO       Or   ondansetron  (ZOFRAN ) injection 4 mg  4 mg Intravenous Q6H PRN Cox, Amy N, DO       pantoprazole  (PROTONIX ) EC tablet 40 mg  40 mg Oral BID AC Alexander, Natalie, DO       rOPINIRole  (REQUIP ) tablet 0.5 mg  0.5 mg Oral Q2000 Cox, Amy N, DO   0.5 mg at 01/10/24 1955   senna-docusate (Senokot-S) tablet 1 tablet  1 tablet Oral QHS PRN Cox,  Amy N, DO       sertraline  (ZOLOFT ) tablet 125 mg  125 mg Oral Daily Cox, Amy N, DO   125 mg at 01/11/24 1006   sodium chloride  0.9 % bolus 500 mL  500 mL Intravenous Once Cox, Amy N, DO   Held at 01/08/24 4098   thiamine  (VITAMIN B1) tablet 100 mg  100 mg Oral Daily Cox, Amy N, DO   100 mg at 01/11/24 1005   traMADol  (ULTRAM ) tablet 50 mg  50 mg Oral Q6H PRN Cox, Amy N, DO   50 mg at 01/08/24 2136     Discharge Medications: PAUSE taking these medications     apixaban  2.5 MG Tabs tablet Wait to take this until: Jan 13, 2024 Commonly known as: Eliquis  Take 1 tablet (2.5 mg total) by mouth 2 (two) times daily.           STOP taking these medications     estradiol 0.1 MG/GM vaginal cream Commonly known as: ESTRACE    midodrine  5 MG tablet Commonly known as: PROAMATINE     NovoLIN R FlexPen ReliOn 100 UNIT/ML FlexPen Generic drug: Insulin  Regular Human           TAKE these medications     acetaminophen  500 MG  tablet Commonly known as: TYLENOL  Take 2 tablets (1,000 mg total) by mouth every 8 (eight) hours. What changed: Another medication with the same name was removed. Continue taking this medication, and follow the directions you see here.    albuterol  108 (90 Base) MCG/ACT inhaler Commonly known as: VENTOLIN  HFA Inhale 2 puffs into the lungs every 6 (six) hours as needed for wheezing or shortness of breath (cough).    allopurinol  100 MG tablet Commonly known as: ZYLOPRIM  Take 100 mg by mouth in the morning.    alum & mag hydroxide-simeth 200-200-20 MG/5ML suspension Commonly known as: MAALOX/MYLANTA Take 10 mLs by mouth 3 (three) times daily as needed for indigestion or heartburn.    amiodarone  200 MG tablet Commonly known as: PACERONE  Take 0.5 tablets (100 mg total) by mouth daily.    Claritin 10 MG tablet Generic drug: loratadine Take 10 mg by mouth daily.    docusate sodium  100 MG capsule Commonly known as: COLACE Take 100 mg by mouth 2 (two) times daily.    famotidine  10 MG tablet Commonly known as: PEPCID  Take 10 mg by mouth in the morning.    FLONASE SENSIMIST NA Place 1 spray into the nose in the morning.    furosemide  20 MG tablet Commonly known as: LASIX  Take 1 tablet (20 mg total) by mouth daily.    gabapentin  300 MG capsule Commonly known as: NEURONTIN  Take 600 mg by mouth 3 (three) times daily. (0800, 1400 & 2000)    ibandronate 150 MG tablet Commonly known as: BONIVA Take 150 mg by mouth every 30 (thirty) days. Take in the morning with a full glass of water , on an empty stomach, and do not take anything else by mouth or lie down for the next 30 min.    iron  polysaccharides 150 MG capsule Commonly known as: NIFEREX Take 1 capsule (150 mg total) by mouth daily.    Lantus  100 UNIT/ML injection Generic drug: insulin  glargine Inject 0.19 mLs (19 Units total) into the skin at bedtime.    levothyroxine  75 MCG tablet Commonly known as: SYNTHROID  Take 37.5  mcg by mouth daily before breakfast. (0600)    magnesium  hydroxide 400 MG/5ML suspension Commonly known as: MILK OF  MAGNESIA Take 30 mLs by mouth daily as needed (constipation).    multivitamin with minerals Tabs tablet Take 1 tablet by mouth daily.    nystatin  powder Apply 1 Application topically in the morning and at bedtime. (0800 & 2000) Apply topically to groin What changed: Another medication with the same name was removed. Continue taking this medication, and follow the directions you see here.    ondansetron  4 MG disintegrating tablet Commonly known as: ZOFRAN -ODT Take 1 tablet (4 mg total) by mouth every 8 (eight) hours as needed for nausea or vomiting.    pantoprazole  40 MG tablet Commonly known as: PROTONIX  Take 1 tablet (40 mg total) by mouth 2 (two) times daily before a meal. What changed: when to take this    polyethylene glycol 17 g packet Commonly known as: MIRALAX  / GLYCOLAX  Take 17 g by mouth daily.    Polyvinyl Alcohol -Povidone PF 1.4-0.6 % Soln Place 1 drop into both eyes in the morning and at bedtime.    rOPINIRole  0.5 MG tablet Commonly known as: REQUIP  Take 0.5 mg by mouth at bedtime. (2000)    senna 8.6 MG tablet Commonly known as: SENOKOT Take 1 tablet (8.6 mg total) by mouth at bedtime as needed for constipation. What changed:  when to take this reasons to take this    sertraline  100 MG tablet Commonly known as: ZOLOFT  Take 100 mg by mouth in the morning. (0800) 100 mg + 25 mg=125 mg    sertraline  25 MG tablet Commonly known as: ZOLOFT  Take 25 mg by mouth in the morning. (0800) 25 mg + 100 mg=125 mg    solifenacin 5 MG tablet Commonly known as: VESICARE Take 5 mg by mouth in the morning. (0800)    thiamine  100 MG tablet Commonly known as: Vitamin B-1 Take 1 tablet (100 mg total) by mouth daily.    traMADol  50 MG tablet Commonly known as: ULTRAM  Take 1 tablet (50 mg total) by mouth every 6 (six) hours as needed for moderate pain (pain  score 4-6).    Vitamin D  50 MCG (2000 UT) Caps Take 2,000 Units by mouth in the morning.    Vitamin D2 50 MCG (2000 UT) Tabs Take 1 tablet by mouth daily.    Relevant Imaging Results:  Relevant Lab Results:   Additional Information SS#: [id number]  Odilia Bennett, LCSW

## 2024-01-11 NOTE — TOC Progression Note (Signed)
 Transition of Care [facility]) - Progression Note    Patient Details  Name: Deborah Obrien MRN: [id number] Date of Birth: 03-11-1941  [facility] CM/SW Contact  Odilia Bennett, LCSW Phone Number: 01/11/2024, 9:45 AM  Clinical Narrative:   CSW called [facility] and spoke to [place]. She confirmed patient was receiving PT and OT through their in-house therapy company. Catina was notified of plan for potential discharge today. They can likely transport her.  Expected Discharge Plan: Assisted Living Barriers to Discharge: Continued Medical Work up  Expected Discharge Plan and Services     Post Acute Care Choice: Resumption of Svcs/PTA Provider Living arrangements for the past 2 months: Assisted Living Facility                                       Social Determinants of Health (SDOH) Interventions SDOH Screenings   Food Insecurity: No Food Insecurity (01/08/2024)  Housing: Low Risk  (01/08/2024)  Transportation Needs: No Transportation Needs (01/08/2024)  Utilities: Not At Risk (01/08/2024)  Financial Resource Strain: High Risk (02/19/2019)   Received from [facility], [facility]  Physical Activity: Unknown (02/19/2019)   Received from [facility], [facility]  Social Connections: Socially Integrated (01/08/2024)  Stress: Stress Concern Present (02/19/2019)   Received from [facility], [facility]  Tobacco Use: Low Risk  (01/10/2024)    Readmission Risk Interventions    01/10/2024    9:49 AM 10/30/2023    4:25 PM  Readmission Risk Prevention Plan  Transportation Screening  Complete  Medication Review (RN Care Manager)  Complete  PCP or Specialist appointment within 3-5 days of discharge Complete Complete  HRI or Home Care Consult Complete Complete  SW Recovery Care/Counseling Consult Complete   Palliative Care Screening Not Applicable Complete  Skilled  Nursing Facility Not Applicable Complete

## 2024-01-11 NOTE — TOC Transition Note (Signed)
 Transition of Care [facility]) - Discharge Note   Patient Details  Name: Deborah Obrien MRN: [id number] Date of Birth: [birth year]  [facility]) CM/SW Contact:  Odilia Bennett, LCSW Phone Number: 01/11/2024, 1:32 PM   Clinical Narrative:  Patient has orders to discharge to [facility] today. CSW faxed FL2 and discharge summary to facility. They will pick her up around 2:00. No further concerns. CSW signing off.   Final next level of care: Assisted Living Barriers to Discharge: Barriers Resolved   Patient Goals and CMS Choice            Discharge Placement                Patient to be transferred to facility by: ALF Staff   Patient and family notified of of transfer: 01/11/24  Discharge Plan and Services Additional resources added to the After Visit Summary for       Post Acute Care Choice: Resumption of Svcs/PTA Provider                               Social Drivers of Health (SDOH) Interventions SDOH Screenings   Food Insecurity: No Food Insecurity (01/08/2024)  Housing: Low Risk  (01/08/2024)  Transportation Needs: No Transportation Needs (01/08/2024)  Utilities: Not At Risk (01/08/2024)  Financial Resource Strain: High Risk (02/19/2019)   Received from [facility], [facility]  Physical Activity: Unknown (02/19/2019)   Received from [facility], [facility]  Social Connections: Socially Integrated (01/08/2024)  Stress: Stress Concern Present (02/19/2019)   Received from [facility], [facility]  Tobacco Use: Low Risk  (01/10/2024)     Readmission Risk Interventions    01/10/2024    9:49 AM 10/30/2023    4:25 PM  Readmission Risk Prevention Plan  Transportation Screening  Complete  Medication Review (RN Care Manager)  Complete  PCP or Specialist appointment within 3-5 days of discharge Complete Complete  HRI or Home Care Consult  Complete Complete  SW Recovery Care/Counseling Consult Complete   Palliative Care Screening Not Applicable Complete  Skilled Nursing Facility Not Applicable Complete

## 2024-01-11 NOTE — Inpatient Diabetes Management (Signed)
 Inpatient Diabetes Program Recommendations  AACE/ADA: New Consensus Statement on Inpatient Glycemic Control   Target Ranges:  Prepandial:   less than 140 mg/dL      Peak postprandial:   less than 180 mg/dL (1-2 hours)      Critically ill patients:  140 - 180 mg/dL    Latest Reference Range & Units 01/08/24 16:55 01/08/24 21:10 01/09/24 21:46 01/10/24 10:52 01/10/24 21:18  Glucose-Capillary 70 - 99 mg/dL 621 (H) 74 308 (H) 657 (H) 376 (H)   Review of Glycemic Control  Diabetes history: DM2 Outpatient Diabetes medications: Lantus  19 units at bedtime, Novolin R 0-10 units TID with meals Current orders for Inpatient glycemic control: Semglee  19 units QHS  Inpatient Diabetes Program Recommendations:    Insulin : CBG 376 mg/dl at 84:69 on 03/11/51. No CBGs or lab glucose in chart today (none ordered). Recommend to order CBGs AC&HS and Novolog  0-9 units TID while remains inpatient. Sent chat message to Dr. Authur Leghorn and RN.  Thanks, Beacher Limerick, RN, MSN, CDCES Diabetes Coordinator Inpatient Diabetes Program [phone] (Team Pager from 8am to 5pm)

## 2024-01-11 NOTE — Discharge Summary (Signed)
 Physician Discharge Summary   Patient: Deborah Obrien MRN: [id number]  DOB: 04/21/1941   Admit:     Date of Admission: 01/08/2024 Admitted from: assisted living   Discharge: Date of discharge: 01/11/24 Disposition: assisted living Condition at discharge: good  CODE STATUS: DNR     Discharge Physician: Melodi Sprung, DO [facility]     PCP: Carlen Chasten, FNP  Recommendations for Outpatient Follow-up:  Follow up with PCP Carlen Chasten, FNP in 1-2 weeks    Discharge Instructions     Diet Carb Modified   Complete by: As directed    Increase activity slowly   Complete by: As directed          Discharge Diagnoses: Principal Problem:   Epigastric pain Active Problems:   BRBPR (bright red blood per rectum)   Dizziness   Paroxysmal atrial fibrillation (HCC)   Transaminitis   Hypothyroidism   Recurrent major depressive disorder, in remission (HCC)   Pulmonary hypertension (HCC)   Restless leg syndrome   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 Eliquis  held. 04/29.  Patient states she ate a piece of toast this morning and had an upset stomach and almost vomited.  Still noting BRBPR on wiping. GI consult: abdominal pain is musculoskeletal (reproducible with flexion of the abdominal wall muscles by just having the patient try to sit up with palpating the abdominal wall lightly). Given BRBPR, will plan for EGD/colonoscopy tomorrow  04/30: EGD --> polyps, no  bleeding. Colonoscopy --> one polyp removes, no bleeding, Likely hemorrhoids as the cause of the BRBPR. Per GI ok to DC no further procedures. Diet ordered  05/01: tolerating diet ok for dc   Consultants:  GI  Procedures/Surgeries: 01/10/24 EGD and colonoscopy      ASSESSMENT & PLAN:   Epigastric pain BRBPR (bright red blood per rectum) likely d/t hemorrhoidal S/p EGD and colonoscopy today no bleeding or old bloos Continue to monitor but no significant bleeding.   Hold Eliquis  few more days then can restart    Paroxysmal atrial fibrillation  Will hold home Eliquis  on admission due to bright red blood per rectum.  restart outpatient    Transaminitis AST and ALT slightly elevated at 70 and 66 respectively Monitor outpatient    Hypothyroidism Continue levothyroxine    Pulmonary hypertension  Follow-up as outpatient   Recurrent major depressive disorder, in remission  Continue Zoloft    Restless leg syndrome Home ropinirole  0.5 mg nightly resumed   CKD stage 3a, GFR 45-59 ml/min  Creatinine 1.0 with a GFR 56 Monitor BMP    overweight based on BMI: Body mass index is 27.1 kg/m.Aaron Aas Significantly low or high BMI is associated with higher medical risk.  Underweight - under 18  overweight - 25 to 29 obese - 30 or more Class 1 obesity: BMI of 30.0 to 34 Class 2 obesity: BMI of 35.0 to 39 Class 3 obesity: BMI of 40.0 to 49 Super Morbid Obesity: BMI 50-59 Super-super Morbid Obesity: BMI 60+ Healthy nutrition and physical activity advised as  adjunct to other disease management and risk reduction treatments             Discharge Instructions  Allergies as of 01/11/2024       Reactions   Fish Allergy Shortness Of Breath   All kinds of fish. Severe vomitting even with smell of fish.    Macrolides And Ketolides Other (See Comments)   Severe stomach pain (mycins)   Meperidine Shortness Of Breath, Nausea Only, Other (See Comments)   Stomach pain    Other Nausea  Only, Swelling, Rash, Anaphylaxis, Diarrhea, Shortness Of Breath, Other (See Comments)   Allergen: NON-STEROIDS, bextra - hands and feet swell Reaction:  Stomach pain   Other Reaction: Intolerance   Prednisone Anaphylaxis, Other (See Comments)   (facial swelling/redness/burning)Increases pts blood sugar  Pt states that she is allergic to all steroids.     Shellfish Allergy Anaphylaxis, Shortness Of Breath, Diarrhea, Nausea Only, Rash   Stomach pain    Sulfa Antibiotics Shortness Of Breath, Diarrhea, Nausea Only, Other (See Comments)   Reaction:  Stomach pain    Sulfacetamide Sodium Diarrhea, Nausea Only, Other (See Comments), Shortness Of Breath   Reaction:  Stomach pain    Telbivudine Other (See Comments)   Unknown reaction   Uloric [febuxostat] Anaphylaxis   Locks pt's body up   Aspirin  Rash, Other (See Comments)   Stomach pain (tolerates lower doses)   Celecoxib Other (See Comments)   GI bleed, weakness, and stomach pain.     Cephalexin Diarrhea, Nausea Only, Other (See Comments)   stomach pain    Codeine Diarrhea, Nausea Only, Other (See Comments)   Reaction:  Stomach pain Pt tolerates morphine     Dilaudid  [hydromorphone  Hcl] Other (See Comments)   Reactions: easy to overdose - blood pressure drops really low   Erythromycin Diarrhea, Nausea Only, Other (See Comments)   Reaction:  Stomach pain    Iodinated Contrast Media Rash, Other (See Comments)   Pt states that she is unable to have because she has chronic kidney disease.     Lipitor [atorvastatin Calcium ] Nausea Only   Reaction: nausea, weakness, pass blood   Oxycodone  Other (See Comments)   Reaction:  Stomach pain    Aleve [naproxen]    Reaction: severe stomach pain   Atorvastatin Nausea Only, Other (See Comments)   Weakness    Cephalosporins Diarrhea, Nausea Only, Other (See Comments)   Other: abdominal pain Tolerated 1st generation cephalosporin (CEFAZOLIN ) on 06/02/2022 and 01/21/2023 without documented ADRs.     Doxycycline    Stomach pain    Iodine Rash   Motrin [ibuprofen]    Reaction: severe stomach pain   Tape Other (See Comments)   Causes pts skin to tear  Pt states that she is able to use paper tape.     Valdecoxib Swelling, Other (See Comments)   Pt states that her hands and feet swell.          Medication List     PAUSE taking these medications    apixaban  2.5 MG Tabs tablet Wait to take this until: Jan 13, 2024 Commonly known as: Eliquis  Take 1 tablet (2.5 mg total) by mouth 2 (two) times daily.       STOP taking these medications    estradiol 0.1 MG/GM vaginal cream Commonly known as: ESTRACE   midodrine  5 MG tablet Commonly known as: PROAMATINE    NovoLIN R FlexPen ReliOn 100 UNIT/ML FlexPen Generic drug: Insulin  Regular Human       TAKE  these medications    acetaminophen  500 MG tablet Commonly known as: TYLENOL  Take 2 tablets (1,000 mg total) by mouth every 8 (eight) hours. What changed: Another medication with the same name was removed. Continue taking this medication, and follow the directions you see here.   albuterol  108 (90 Base) MCG/ACT inhaler Commonly known as: VENTOLIN  HFA Inhale 2 puffs into the lungs every 6 (six) hours as needed for wheezing or shortness of breath (cough).   allopurinol  100 MG tablet Commonly known as: ZYLOPRIM  Take 100 mg by mouth in the morning.   alum & mag hydroxide-simeth 200-200-20 MG/5ML suspension Commonly known as: MAALOX/MYLANTA Take 10 mLs by mouth 3 (three) times daily as needed for indigestion or heartburn.   amiodarone  200 MG tablet Commonly known as: PACERONE  Take 0.5 tablets (100 mg total) by mouth daily.   Claritin 10 MG tablet Generic drug: loratadine Take 10 mg by mouth daily.   docusate sodium  100 MG capsule Commonly known as: COLACE Take 100 mg by mouth 2 (two) times daily.   famotidine  10 MG tablet Commonly known as: PEPCID  Take 10 mg by mouth in the morning.   FLONASE SENSIMIST NA Place  1 spray into the nose in the morning.   furosemide  20 MG tablet Commonly known as: LASIX  Take 1 tablet (20 mg total) by mouth daily.   gabapentin  300 MG capsule Commonly known as: NEURONTIN  Take 600 mg by mouth 3 (three) times daily. (0800, 1400 & 2000)   ibandronate 150 MG tablet Commonly known as: BONIVA Take 150 mg by mouth every 30 (thirty) days. Take in the morning with a full glass of water , on an empty stomach, and do not take anything else by mouth or lie down for the next 30 min.   iron  polysaccharides 150 MG capsule Commonly known as: NIFEREX Take 1 capsule (150 mg total) by mouth daily.   Lantus  100 UNIT/ML injection Generic drug: insulin  glargine Inject 0.19 mLs (19 Units total) into the skin at bedtime.   levothyroxine  75 MCG tablet Commonly known as: SYNTHROID  Take 37.5 mcg by mouth daily before breakfast. (0600)   magnesium  hydroxide 400 MG/5ML suspension Commonly known as: MILK OF MAGNESIA Take 30 mLs by mouth daily as needed (constipation).   multivitamin with minerals Tabs tablet Take 1 tablet by mouth daily.   nystatin  powder Apply 1 Application topically in the morning and at bedtime. (0800 & 2000) Apply topically to groin What changed: Another medication with the same name was removed. Continue taking this medication, and follow the directions you see here.   ondansetron  4 MG disintegrating tablet Commonly known as: ZOFRAN -ODT Take 1 tablet (4 mg total) by mouth every 8 (eight) hours as needed for nausea or vomiting.   pantoprazole  40 MG tablet Commonly known as: PROTONIX  Take 1 tablet (40 mg total) by mouth 2 (two) times daily before a meal. What changed: when to take this   polyethylene glycol 17 g packet Commonly known as: MIRALAX  / GLYCOLAX  Take 17 g by mouth daily.   Polyvinyl Alcohol -Povidone PF 1.4-0.6 % Soln Place 1 drop into both eyes in the morning and at bedtime.   rOPINIRole  0.5 MG tablet Commonly known as: REQUIP  Take 0.5 mg by  mouth at bedtime. (2000)   senna 8.6 MG tablet Commonly known as: SENOKOT Take 1 tablet (8.6 mg total) by mouth at bedtime as needed for constipation. What changed:  when to take this reasons to take this   sertraline  100 MG tablet Commonly known  as: ZOLOFT  Take 100 mg by mouth in the morning. (0800) 100 mg + 25 mg=125 mg   sertraline  25 MG tablet Commonly known as: ZOLOFT  Take 25 mg by mouth in the morning. (0800) 25 mg + 100 mg=125 mg   solifenacin 5 MG tablet Commonly known as: VESICARE Take 5 mg by mouth in the morning. (0800)   thiamine  100 MG tablet Commonly known as: Vitamin B-1 Take 1 tablet (100 mg total) by mouth daily.   traMADol  50 MG tablet Commonly known as: ULTRAM  Take 1 tablet (50 mg total) by mouth every 6 (six) hours as needed for moderate pain (pain score 4-6).   Vitamin D  50 MCG (2000 UT) Caps Take 2,000 Units by mouth in the morning.   Vitamin D2 50 MCG (2000 UT) Tabs Take 1 tablet by mouth daily.         Follow-up Information     Carlen Chasten, FNP. Schedule an appointment as soon as possible for a visit .   Specialty: Family Medicine Contact information: [address] [place] [address] [phone]         [facility] Emergency Department at [facility]. Go to .   Specialty: Emergency Medicine Why: If symptoms worsen Contact information: [address] [place]  [address] [phone]                Allergies  Allergen Reactions   Fish Allergy Shortness Of Breath    All kinds of fish. Severe vomitting even with smell of fish.    Macrolides And Ketolides Other (See Comments)    Severe stomach pain (mycins)   Meperidine Shortness Of Breath, Nausea Only and Other (See Comments)    Stomach pain    Other Nausea Only, Swelling, Rash, Anaphylaxis, Diarrhea, Shortness Of Breath and Other (See Comments)    Allergen: NON-STEROIDS, bextra - hands and feet swell  Reaction:  Stomach  pain   Other Reaction: Intolerance   Prednisone Anaphylaxis and Other (See Comments)    (facial swelling/redness/burning)Increases pts blood sugar  Pt states that she is allergic to all steroids.     Shellfish Allergy Anaphylaxis, Shortness Of Breath, Diarrhea, Nausea Only and Rash    Stomach pain    Sulfa Antibiotics Shortness Of Breath, Diarrhea, Nausea Only and Other (See Comments)    Reaction:  Stomach pain     Sulfacetamide Sodium Diarrhea, Nausea Only, Other (See Comments) and Shortness Of Breath    Reaction:  Stomach pain    Telbivudine Other (See Comments)    Unknown reaction   Uloric [Febuxostat] Anaphylaxis    Locks pt's body up    Aspirin  Rash and Other (See Comments)    Stomach pain (tolerates lower doses)   Celecoxib Other (See Comments)    GI bleed, weakness, and stomach pain.     Cephalexin Diarrhea, Nausea Only and Other (See Comments)    stomach pain    Codeine Diarrhea, Nausea Only and Other (See Comments)    Reaction:  Stomach pain Pt tolerates morphine     Dilaudid  [Hydromorphone  Hcl] Other (See Comments)    Reactions: easy to overdose - blood pressure drops really low   Erythromycin Diarrhea, Nausea Only and Other (See Comments)    Reaction:  Stomach pain    Iodinated Contrast Media Rash and Other (See Comments)    Pt states that she is unable to have because she has chronic kidney disease.     Lipitor [Atorvastatin Calcium ] Nausea Only  Reaction: nausea, weakness, pass blood   Oxycodone  Other (See Comments)    Reaction:  Stomach pain    Aleve [Naproxen]     Reaction: severe stomach pain   Atorvastatin Nausea Only and Other (See Comments)    Weakness    Cephalosporins Diarrhea, Nausea Only and Other (See Comments)    Other: abdominal pain  Tolerated 1st generation cephalosporin (CEFAZOLIN ) on 06/02/2022 and 01/21/2023 without documented ADRs.    Doxycycline     Stomach pain    Iodine Rash   Motrin [Ibuprofen]     Reaction: severe stomach pain    Tape Other (See Comments)    Causes pts skin to tear  Pt states that she is able to use paper tape.       Valdecoxib Swelling and Other (See Comments)    Pt states that her hands and feet swell.       Subjective: pt reports feeling well this morning, tolerating diet, no pain, no further bleeding    Discharge Exam: BP 134/72 (BP Location: Left Arm)   Pulse 60   Temp 98.4 F (36.9 C) (Oral)   Resp 17   Ht 5\' 3"  (1.6 m)   Wt 69.4 kg   SpO2 99%   BMI 27.10 kg/m  General: Pt is alert, awake, not in acute distress Cardiovascular: RRR, S1/S2 +, no rubs, no gallops Respiratory: CTA bilaterally, no wheezing, no rhonchi Abdominal: Soft, NT, ND, bowel sounds + Extremities: no edema, no cyanosis     The results of significant diagnostics from this hospitalization (including imaging, microbiology, ancillary and laboratory) are listed below for reference.     Microbiology: No results found for this or any previous visit (from the past 240 hours).   Labs: BNP (last 3 results) Recent Labs    01/21/23 1239 10/28/23 1123  BNP 1,487.0* 417.4*   Basic Metabolic Panel: Recent Labs  Lab 01/08/24 0822 01/09/24 0547  NA 140 135  K 4.4 3.9  CL 104 106  CO2 25 25  GLUCOSE 181* 102*  BUN 31* 23  CREATININE 1.00 1.07*  CALCIUM  8.8* 8.2*   Liver Function Tests: Recent Labs  Lab 01/08/24 0822  AST 70*  ALT 66*  ALKPHOS 184*  BILITOT 1.1  PROT 7.0  ALBUMIN  3.5   Recent Labs  Lab 01/08/24 0822  LIPASE 23   No results for input(s): "AMMONIA" in the last 168 hours. CBC: Recent Labs  Lab 01/08/24 0822 01/08/24 1224 01/09/24 0547 01/10/24 0552 01/11/24 0547  WBC 3.2* 3.2* 3.4* 3.4* 3.2*  NEUTROABS 1.5* 1.5*  --   --   --   HGB 12.4 12.2 11.7* 11.1* 11.7*  HCT 40.1 39.5 35.6* 33.9* 35.5*  MCV 102.3* 101.3* 96.7 96.6 95.9  PLT 143* 156 169 161 174   Cardiac Enzymes: No results for input(s): "CKTOTAL", "CKMB", "CKMBINDEX", "TROPONINI" in the last 168  hours. BNP: Invalid input(s): "POCBNP" CBG: Recent Labs  Lab 01/08/24 1655 01/08/24 2110 01/09/24 2146 01/10/24 1052 01/10/24 2118  GLUCAP 127* 74 365* 165* 376*   D-Dimer No results for input(s): "DDIMER" in the last 72 hours. Hgb A1c No results for input(s): "HGBA1C" in the last 72 hours. Lipid Profile No results for input(s): "CHOL", "HDL", "LDLCALC", "TRIG", "CHOLHDL", "LDLDIRECT" in the last 72 hours. Thyroid  function studies No results for input(s): "TSH", "T4TOTAL", "T3FREE", "THYROIDAB" in the last 72 hours.  Invalid input(s): "FREET3" Anemia work up No results for input(s): "VITAMINB12", "FOLATE", "FERRITIN", "TIBC", "IRON ", "RETICCTPCT" in the last 72  hours. Urinalysis    Component Value Date/Time   COLORURINE YELLOW (A) 01/08/2024 0822   APPEARANCEUR CLEAR (A) 01/08/2024 0822   APPEARANCEUR Hazy 10/29/2012 1534   LABSPEC 1.009 01/08/2024 0822   LABSPEC 1.010 10/29/2012 1534   PHURINE 5.0 01/08/2024 0822   GLUCOSEU 50 (A) 01/08/2024 0822   GLUCOSEU Negative 10/29/2012 1534   HGBUR NEGATIVE 01/08/2024 0822   BILIRUBINUR NEGATIVE 01/08/2024 0822   BILIRUBINUR Negative 10/29/2012 1534   KETONESUR NEGATIVE 01/08/2024 0822   PROTEINUR NEGATIVE 01/08/2024 0822   NITRITE NEGATIVE 01/08/2024 0822   LEUKOCYTESUR NEGATIVE 01/08/2024 0822   LEUKOCYTESUR Negative 10/29/2012 1534   Sepsis Labs Recent Labs  Lab 01/08/24 1224 01/09/24 0547 01/10/24 0552 01/11/24 0547  WBC 3.2* 3.4* 3.4* 3.2*   Microbiology No results found for this or any previous visit (from the past 240 hours). Imaging CT ABDOMEN PELVIS WO CONTRAST Result Date: 01/08/2024 CLINICAL DATA:  Rectal bleeding and abdominal pain. EXAM: CT ABDOMEN AND PELVIS WITHOUT CONTRAST TECHNIQUE: Multidetector CT imaging of the abdomen and pelvis was performed following the standard protocol without IV contrast. RADIATION DOSE REDUCTION: This exam was performed according to the departmental dose-optimization  program which includes automated exposure control, adjustment of the mA and/or kV according to patient size and/or use of iterative reconstruction technique. COMPARISON:  10/28/2023 FINDINGS: Lower chest: Stable scarring at the left lung base peer Hepatobiliary: No focal liver abnormality is seen. Status post cholecystectomy. No biliary dilatation. Pancreas: Stable atrophic pancreas. Spleen: Normal in size without focal abnormality. Adrenals/Urinary Tract: No adrenal masses. No renal calculi. Mild dilatation of the right renal pelvis and proximal ureter without evidence renal calculi. Moderate distension of the bladder. Stomach/Bowel: No evidence of bowel obstruction, ileus or free intraperitoneal air. No acute inflammatory process identified. The appendix is not visualized and presumably surgically absent. Vascular/Lymphatic: Stable atherosclerosis of the abdominal aorta without aneurysm. No enlarged lymph nodes identified in the abdomen or pelvis. Reproductive: Status post hysterectomy. No adnexal masses. Other: No abdominal wall hernia or abnormality. No abdominopelvic ascites. Musculoskeletal: Stable evidence prior posterior lumbar fusion at the L4-5 and L5-S1 levels. Stable compression deformities at T12 and L1 with prior vertebral augmentation. IMPRESSION: 1. No acute findings in the abdomen or pelvis. 2. Mild dilatation of the right renal pelvis and proximal ureter without evidence of renal calculi. Moderate distension of the bladder. 3. Stable atherosclerosis of the abdominal aorta without aneurysm. 4. Stable compression deformities at T12 and L1 with prior vertebral augmentation. 5. Status post cholecystectomy and hysterectomy. Electronically Signed   By: Erica Hau M.D.   On: 01/08/2024 10:45      Time coordinating discharge: over 30 minutes  SIGNED:  Octavie Westerhold DO [facility]

## 2024-01-11 NOTE — Care Management Important Message (Signed)
 Important Message  Patient Details  Name: Deborah Obrien MRN: [id number] Date of Birth: 1940/10/05   Important Message Given:  Yes - Medicare IM     Anise Kerns 01/11/2024, 12:08 PM

## 2024-01-11 NOTE — Anesthesia Postprocedure Evaluation (Signed)
 Anesthesia Post Note  Patient: Deborah Obrien  Procedure(s) Performed: COLONOSCOPY EGD (ESOPHAGOGASTRODUODENOSCOPY)  Patient location during evaluation: Endoscopy Anesthesia Type: General Level of consciousness: awake and alert Pain management: pain level controlled Vital Signs Assessment: post-procedure vital signs reviewed and stable Respiratory status: spontaneous breathing, nonlabored ventilation, respiratory function stable and patient connected to nasal cannula oxygen Cardiovascular status: blood pressure returned to baseline and stable Postop Assessment: no apparent nausea or vomiting Anesthetic complications: no   There were no known notable events for this encounter.   Last Vitals:  Vitals:   01/11/24 0344 01/11/24 0744  BP: (!) 141/57 134/72  Pulse: 62 60  Resp: 16 17  Temp: 37.1 C 36.9 C  SpO2: 98% 99%    Last Pain:  Vitals:   01/11/24 1218  TempSrc:   PainSc: 5                  Enrique Harvest

## 2024-01-24 NOTE — Addendum Note (Signed)
 Addended by: Edra Govern D on: 01/24/2024 10:38 AM   Modules accepted: Orders

## 2024-01-24 NOTE — Progress Notes (Signed)
 Remote pacemaker transmission.

## 2024-03-11 ENCOUNTER — Ambulatory Visit (INDEPENDENT_AMBULATORY_CARE_PROVIDER_SITE_OTHER): Payer: Medicare Other

## 2024-03-11 DIAGNOSIS — I480 Paroxysmal atrial fibrillation: Secondary | ICD-10-CM

## 2024-03-11 DIAGNOSIS — I495 Sick sinus syndrome: Secondary | ICD-10-CM

## 2024-03-11 LAB — CUP PACEART REMOTE DEVICE CHECK
Battery Remaining Longevity: 64 mo
Battery Voltage: 2.9 V
Brady Statistic AP VP Percent: 0.04 %
Brady Statistic AP VS Percent: 99.49 %
Brady Statistic AS VP Percent: 0 %
Brady Statistic AS VS Percent: 0.46 %
Brady Statistic RA Percent Paced: 99.76 %
Brady Statistic RV Percent Paced: 0.04 %
Date Time Interrogation Session: 20250629224639
Implantable Lead Connection Status: 753985
Implantable Lead Connection Status: 753985
Implantable Lead Implant Date: 20161019
Implantable Lead Implant Date: 20181217
Implantable Lead Location: 753859
Implantable Lead Location: 753860
Implantable Lead Model: 5076
Implantable Lead Model: 5076
Implantable Pulse Generator Implant Date: 20181217
Lead Channel Impedance Value: 323 Ohm
Lead Channel Impedance Value: 361 Ohm
Lead Channel Impedance Value: 418 Ohm
Lead Channel Impedance Value: 437 Ohm
Lead Channel Pacing Threshold Amplitude: 0.75 V
Lead Channel Pacing Threshold Amplitude: 0.875 V
Lead Channel Pacing Threshold Pulse Width: 0.4 ms
Lead Channel Pacing Threshold Pulse Width: 0.4 ms
Lead Channel Sensing Intrinsic Amplitude: 4.625 mV
Lead Channel Sensing Intrinsic Amplitude: 4.625 mV
Lead Channel Sensing Intrinsic Amplitude: 4.625 mV
Lead Channel Sensing Intrinsic Amplitude: 4.625 mV
Lead Channel Setting Pacing Amplitude: 2 V
Lead Channel Setting Pacing Amplitude: 2.5 V
Lead Channel Setting Pacing Pulse Width: 0.4 ms
Lead Channel Setting Sensing Sensitivity: 1.2 mV
Zone Setting Status: 755011

## 2024-03-13 ENCOUNTER — Ambulatory Visit: Payer: Self-pay | Admitting: Cardiology

## 2024-04-11 NOTE — Progress Notes (Signed)
 Remote pacemaker transmission.

## 2024-05-14 ENCOUNTER — Emergency Department
Admission: EM | Admit: 2024-05-14 | Discharge: 2024-05-15 | Disposition: A | Discharge status: Skilled Nursing Facility | Attending: Emergency Medicine | Admitting: Emergency Medicine

## 2024-05-14 ENCOUNTER — Other Ambulatory Visit: Payer: Self-pay

## 2024-05-14 ENCOUNTER — Emergency Department

## 2024-05-14 DIAGNOSIS — I2510 Atherosclerotic heart disease of native coronary artery without angina pectoris: Secondary | ICD-10-CM | POA: Diagnosis not present

## 2024-05-14 DIAGNOSIS — R109 Unspecified abdominal pain: Secondary | ICD-10-CM | POA: Diagnosis present

## 2024-05-14 DIAGNOSIS — R1010 Upper abdominal pain, unspecified: Secondary | ICD-10-CM | POA: Insufficient documentation

## 2024-05-14 DIAGNOSIS — R112 Nausea with vomiting, unspecified: Secondary | ICD-10-CM | POA: Diagnosis not present

## 2024-05-14 DIAGNOSIS — Z7901 Long term (current) use of anticoagulants: Secondary | ICD-10-CM | POA: Diagnosis not present

## 2024-05-14 DIAGNOSIS — F0150 Vascular dementia without behavioral disturbance: Secondary | ICD-10-CM | POA: Insufficient documentation

## 2024-05-14 DIAGNOSIS — K219 Gastro-esophageal reflux disease without esophagitis: Secondary | ICD-10-CM

## 2024-05-14 LAB — CBC
HCT: 38.6 % (ref 36.0–46.0)
Hemoglobin: 12.9 g/dL (ref 12.0–15.0)
MCH: 33.2 pg (ref 26.0–34.0)
MCHC: 33.4 g/dL (ref 30.0–36.0)
MCV: 99.2 fL (ref 80.0–100.0)
Platelets: 149 K/uL — ABNORMAL LOW (ref 150–400)
RBC: 3.89 MIL/uL (ref 3.87–5.11)
RDW: 12.7 % (ref 11.5–15.5)
WBC: 4.2 K/uL (ref 4.0–10.5)
nRBC: 0 % (ref 0.0–0.2)

## 2024-05-14 LAB — URINALYSIS, ROUTINE W REFLEX MICROSCOPIC
Bilirubin Urine: NEGATIVE
Glucose, UA: NEGATIVE mg/dL
Hgb urine dipstick: NEGATIVE
Ketones, ur: NEGATIVE mg/dL
Leukocytes,Ua: NEGATIVE
Nitrite: NEGATIVE
Protein, ur: NEGATIVE mg/dL
Specific Gravity, Urine: 1.009 (ref 1.005–1.030)
pH: 5 (ref 5.0–8.0)

## 2024-05-14 LAB — TROPONIN I (HIGH SENSITIVITY)
Troponin I (High Sensitivity): 8 ng/L (ref <18)
Troponin I (High Sensitivity): 8 ng/L (ref <18)

## 2024-05-14 LAB — COMPREHENSIVE METABOLIC PANEL WITH GFR
ALT: 29 U/L (ref 0–44)
AST: 47 U/L — ABNORMAL HIGH (ref 15–41)
Albumin: 4.1 g/dL (ref 3.5–5.0)
Alkaline Phosphatase: 125 U/L (ref 38–126)
Anion gap: 9 (ref 5–15)
BUN: 47 mg/dL — ABNORMAL HIGH (ref 8–23)
CO2: 26 mmol/L (ref 22–32)
Calcium: 9.4 mg/dL (ref 8.9–10.3)
Chloride: 104 mmol/L (ref 98–111)
Creatinine, Ser: 1.44 mg/dL — ABNORMAL HIGH (ref 0.44–1.00)
GFR, Estimated: 36 mL/min — ABNORMAL LOW (ref >60)
Glucose, Bld: 207 mg/dL — ABNORMAL HIGH (ref 70–99)
Potassium: 4.9 mmol/L (ref 3.5–5.1)
Sodium: 139 mmol/L (ref 135–145)
Total Bilirubin: 1 mg/dL (ref 0.0–1.2)
Total Protein: 7.5 g/dL (ref 6.5–8.1)

## 2024-05-14 LAB — RESP PANEL BY RT-PCR (RSV, FLU A&B, COVID)  RVPGX2
Influenza A by PCR: NEGATIVE
Influenza B by PCR: NEGATIVE
Resp Syncytial Virus by PCR: NEGATIVE
SARS Coronavirus 2 by RT PCR: NEGATIVE

## 2024-05-14 LAB — LIPASE, BLOOD: Lipase: 32 U/L (ref 11–51)

## 2024-05-14 MED ORDER — ONDANSETRON HCL 4 MG/2ML IJ SOLN
4.0000 mg | Freq: Once | INTRAMUSCULAR | Status: AC
  Administered 2024-05-14: 4 mg via INTRAVENOUS
  Filled 2024-05-14: qty 2

## 2024-05-14 MED ORDER — SODIUM CHLORIDE 0.9 % IV BOLUS: 1000.0000 mL | Freq: Once | INTRAVENOUS | Status: DC

## 2024-05-14 MED ORDER — ALUM & MAG HYDROXIDE-SIMETH 200-200-20 MG/5ML PO SUSP
30.0000 mL | Freq: Once | ORAL | Status: AC
  Administered 2024-05-14: 30 mL via ORAL
  Filled 2024-05-14: qty 30

## 2024-05-14 MED ORDER — PANTOPRAZOLE SODIUM 40 MG IV SOLR
40.0000 mg | Freq: Once | INTRAVENOUS | Status: AC
  Administered 2024-05-14: 40 mg via INTRAVENOUS
  Filled 2024-05-14: qty 10

## 2024-05-14 MED ORDER — METOCLOPRAMIDE HCL 5 MG/ML IJ SOLN
10.0000 mg | INTRAMUSCULAR | Status: AC
  Administered 2024-05-14: 10 mg via INTRAVENOUS
  Filled 2024-05-14: qty 2

## 2024-05-14 MED ORDER — SODIUM CHLORIDE 0.9 % IV BOLUS
500.0000 mL | Freq: Once | INTRAVENOUS | Status: AC
  Administered 2024-05-14: 500 mL via INTRAVENOUS

## 2024-05-14 MED ORDER — FAMOTIDINE 20 MG PO TABS
20.0000 mg | ORAL_TABLET | Freq: Once | ORAL | Status: AC
  Administered 2024-05-14: 20 mg via ORAL
  Filled 2024-05-14: qty 1

## 2024-05-14 MED ORDER — FENTANYL CITRATE PF 50 MCG/ML IJ SOSY
25.0000 ug | PREFILLED_SYRINGE | Freq: Once | INTRAMUSCULAR | Status: AC
  Administered 2024-05-14: 25 ug via INTRAVENOUS
  Filled 2024-05-14: qty 1

## 2024-05-14 MED ORDER — FAMOTIDINE 10 MG PO TABS: 20.0000 mg | ORAL_TABLET | Freq: Two times a day (BID) | ORAL | 0 refills | Status: AC

## 2024-05-14 MED ORDER — SUCRALFATE 1 G PO TABS: 1.0000 g | ORAL_TABLET | Freq: Four times a day (QID) | ORAL | 1 refills | Status: AC

## 2024-05-14 NOTE — ED Provider Notes (Signed)
 Procedures     ----------------------------------------- 8:02 PM on 05/14/2024 -----------------------------------------  CT reveals a hiatal hernia.  Patient reports after Protonix  she is feeling better, symptoms have essentially resolved.  On exam she does have some mild epigastric/left upper quadrant tenderness.  Discussed the possibility of doing a CT angiogram with her, but she agrees that with her improved symptoms she does believe this pain to be related to GERD and hiatal hernia.  Low suspicion for ACS PE dissection or aneurysm at this time.  Will continue antacids, follow-up PCP.    Deborah Pastor, MD 05/14/24 2003

## 2024-05-14 NOTE — ED Provider Notes (Signed)
 [facility] Provider Note    Event Date/Time   First MD Initiated Contact with Patient 05/14/24 1346     (approximate)   History   Abdominal Pain   HPI  Deborah Obrien is a 83 y.o. female with cholecystectomy, hysterectomy, paroxysmal A-fib on Eliquis , vascular dementia, coronary disease who comes in with concerns for abdominal pain.  I reviewed a note from 01/08/2024 where patient was admitted for rectal bleeding.  Patient's EGD had no active bleeding and colonoscopy had 1 polyp that was removed.  Likely hemorrhoids.  She comes in from [place] [facility] due to concerns for abdominal pain that started after breakfast.  This was associated with 3 episodes of vomiting.  Patient reports the pain was in her upper abdomen, a little bit in her chest, stabbing sensation associate with nausea, vomiting.  She reports that she has not had this before.  Denies any new numbness, tingling.  She denies any recent falls or hitting her head.   Physical Exam   Triage Vital Signs: ED Triage Vitals  Encounter Vitals Group     BP 05/14/24 1121 (!) 134/108     Girls Systolic BP Percentile --      Girls Diastolic BP Percentile --      Boys Systolic BP Percentile --      Boys Diastolic BP Percentile --      Pulse Rate 05/14/24 1121 69     Resp 05/14/24 1121 18     Temp 05/14/24 1121 (!) 97.5 F (36.4 C)     Temp src --      SpO2 05/14/24 1121 100 %     Weight --      Height --      Head Circumference --      Peak Flow --      Pain Score 05/14/24 1054 9     Pain Loc --      Pain Education --      Exclude from Growth Chart --     Most recent vital signs: Vitals:   05/14/24 1121  BP: (!) 134/108  Pulse: 69  Resp: 18  Temp: (!) 97.5 F (36.4 C)  SpO2: 100%     General: Awake, no distress.  CV:  Good peripheral perfusion.  Resp:  Normal effort.  Abd:  No distention.  Slight tenderness in the upper abdomen without any rebound, guarding Other:  Equal  radial pulses equal DP pulses.  Sensation intact.   ED Results / Procedures / Treatments   Labs (all labs ordered are listed, but only abnormal results are displayed) Labs Reviewed  COMPREHENSIVE METABOLIC PANEL WITH GFR - Abnormal; Notable for the following components:      Result Value   Glucose, Bld 207 (*)    BUN 47 (*)    Creatinine, Ser 1.44 (*)    AST 47 (*)    GFR, Estimated 36 (*)    All other components within normal limits  CBC - Abnormal; Notable for the following components:   Platelets 149 (*)    All other components within normal limits  LIPASE, BLOOD  URINALYSIS, ROUTINE W REFLEX MICROSCOPIC     EKG  My interpretation of EKG:  Atrial paced rhythm with a rate of 62 without any ST elevation or T wave versions, normal intervals  RADIOLOGY Pending   PROCEDURES:  Critical Care performed: No  Procedures   MEDICATIONS ORDERED IN ED: Medications - No data to display   IMPRESSION /  MDM / ASSESSMENT AND PLAN / ED COURSE  I reviewed the triage vital signs and the nursing notes.   Patient's presentation is most consistent with acute presentation with potential threat to life or bodily function.  Patient comes in with some upper abdominal lower chest pain.  I initially considered the possibility of dissection given patient's age with some upper abdominal and chest pain, stabbing however she has got no other signs of dissection no radiation into the back, no numbness, no tingling no pulse deficit.  I considered doing contrast to evaluate but patient has a contrast allergy listed.  It is hard to know if this is a real contrast allergy.  She reports that she thinks years ago she actually did get contrast she was just told that she should not get contrast because of a seafood allergy.  She does report having a history of a rash to iodine but denies having any known rash from contrast.  We discussed doing a prep for the CT versus going ahead and doing the CT and she  preferred to do the prep however when I went to go order the prep also has anaphylaxis noted to steroids.  Therefore given her vitals are stable and this could just be obstruction or gastroenteritis will proceed with symptomatic treatment with fentanyl , Zofran , fluids and do a CT without contrast to start.  Patient be handed off to oncoming team pending these and if patient continues to have significant pain that would need to be another discussion with patient about whether or not we want to proceed with IV contrast with possible risk for allergic reaction  CBC shows normal white count.  Lipase normal CMP shows slightly elevated creatinine we will give some IV fluids.  4:05 PM  Tried to re-evaluate pt prior to me leaving but pt was in CT scanner.    The patient is on the cardiac monitor to evaluate for evidence of arrhythmia and/or significant heart rate changes.      FINAL CLINICAL IMPRESSION(S) / ED DIAGNOSES   Final diagnoses:  Abdominal pain, unspecified abdominal location     Rx / DC Orders   ED Discharge Orders     None        Note:  This document was prepared using Dragon voice recognition software and may include unintentional dictation errors.   Ernest Ronal BRAVO, MD 05/14/24 [phone]

## 2024-05-14 NOTE — ED Triage Notes (Signed)
 Pt comes via [facility] from [facility] with c/o belly pain sudden onset after breakfast. Pt states pain is mid upper belly pain. Pt states vomited three times. Pt having some dry heaves with EMS . Pt A*OX4 BP 157/81

## 2024-05-23 ENCOUNTER — Encounter: Payer: Self-pay | Admitting: *Deleted

## 2024-05-23 NOTE — Progress Notes (Addendum)
 Chief complaint: Diabetes  History of present illness: Deborah Obrien is 83 y.o. female seen in consultation for type II diabetes at the request of Wilbert Hacker, NP. Notes and labs from their office were kindly faxed and reviewed. She resides at [facility] and is accompanied by no one today. She provides 100% of her history and is a reliable historian.   She has had diabetes for several years. Her last Hb A1c was 8.1% in 02/2024 (was 9.2% prior to this). She states she had labs drawn yesterday at the facility and believes her A1c was part of those labs. She states the nursing staff is checking her blood sugars 3-4 times per day via fingerstick. They did not provide any recent blood sugar readings. She states overall her sugars vary widely from 60 - 300's at times. She inquires about possibly starting a CGM today (her son uses one).   She is currently prescribed for her diabetes: Lantus  17 units QAM (takes around 6 AM) Novolin R insulin  TID AC per sliding scale: bs 70 - 120, skip dose. 121 - 150, take 2 units. 151 - 200, take 4 units. 201 - 250, take 5 units. 251 - 300, take 6 units. 301 - 350, take 8 units. Over 351, take 10 units.  Review of her diet shows she tries to limit carbs/starches, but this is difficult as she receives the meals that are offered at the facility and they tend to have a lot of carbs in them. She stays active during the day and ambulates with a walker.  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 Dennise at [facility]). She last had an eye exam with Dr. Lethea at [facility] on 03/05/24. Reports exam was unremarkable.   Past Medical History:  Diagnosis Date  . Acid reflux   . Allergic rhinitis due to allergen   . Anxiety   . Atrial fibrillation (CMS/HHS-HCC)   . Atrial fibrillation status post cardioversion (CMS/HHS-HCC)   . Back ache   . Chest pain on exertion   . Chronic kidney disease    . Colon polyp 02/10/14   TUBULAR ADENOMA  . Degenerative disc disease, cervical   . Degenerative disc disease, lumbar   . Diabetes mellitus type 2, uncomplicated (CMS/HHS-HCC)   . Esophageal motility disorder 01/27/2015  . Fatty liver   . Fundic gland polyps of stomach, benign   . Gout   . History of hemorrhoids   . History of panic attacks   . Hypercholesterolemia   . Hyperplastic polyps of stomach 01/27/2015, 12/22/2014  . Hypertension   . Nephrolithiasis   . Osteoarthritis   . Osteoporosis   . Overactive bladder   . Overdose    on vicodin and oxycodone  for back pain  . Pacemaker   . Pneumonia 01/25/2016  . Sepsis (CMS/HHS-HCC) 07/22/2015  . Stroke (CMS/HHS-HCC)   . Trigger thumb of left hand   . Varicella     Past Surgical History:  Procedure Laterality Date  . TONSILLECTOMY AND ADENOIDECTOMY  1953  . colon surgery  1999   colon surgery as well as colon blockage surgery in 1999  . lower back surgery  09/29/2006   second surgery on lower back in 3/4/ 2008  . EGD  03/03/2009  . Left total knee arthroplasty using computer-assisted navigation   11/03/2010   Dr Mardee  . LENS EYE SURGERY Right 07/20/2011   cataract extraction  . LENS EYE SURGERY Left  08/24/2011   cataract extraction  . Right total knee arthroplasty using computer-assisted navigation  10/10/2012   Dr Mardee  . COLONOSCOPY  02/10/2014  . Left subacromial decompression Left 10/27/2014  . EGD  12/22/2014   Fundic gland polyp/Hyperplastic polyps of stomach/Repeat 4wks/MUS  . EGD  01/27/2015   Hyperplastic polyps of stomach/Negative for H. Pylori/No Repeat/MUS  . Left DeQuervain's release  09/14/2016   Dr Mardee  . Extensive arthroscopic debridement, arthroscopic subscapularis tendon repair, arthroscopic subacromial decompression, biceps tenolysis, and mini-open repair of recurrent supraspinatus tear augmented with a [employer] Regeneten patch, le Left 11/21/2019   Dr.Poggi   . Intramedullary nailing of  closed displaced comminuted left humeral shaft fracture. Left 06/02/2022   Dr.Poggi  . Rt hip removal of deep hardware, Rt hip placement of cephalomedullary lag screw and compression screw Right 10/17/2023   Dr. Tobie  . APPENDECTOMY    . carpal tunnel surgery Left   . CHOLECYSTECTOMY    . CHOLECYSTECTOMY    . COLONOSCOPY  03/03/2009, 02/10/2014  . ESSURE TUBAL LIGATION    . foot surgery Left   . hemrrhoidectomy    . HYSTERECTOMY VAGINAL    . neck surgery     vertebrae 6 7 and 8  . right elbow surgery    . shoulder surgery Right   . thumb surgery Bilateral   . tumor under arm Left    benign    Family History  Problem Relation Name Age of Onset  . High blood pressure (Hypertension) Mother    . Diabetes Mother    . Coronary Artery Disease (Blocked arteries around heart) Mother    . Stroke Mother    . Allergies Mother    . Osteoporosis (Thinning of bones) Mother    . Cancer Father    . High blood pressure (Hypertension) Father    . Stroke Father    . Myocardial Infarction (Heart attack) Father    . Kidney disease Neg Hx    . ESRD-Dialysis Neg Hx    . ESRD-Transplant Neg Hx      Social History   Tobacco Use  . Smoking status: Never  . Smokeless tobacco: Never  Substance Use Topics  . Alcohol  use: No    Alcohol /week: 0.0 standard drinks of alcohol     Outpatient Medications Marked as Taking for the 05/23/24 encounter (Initial consult) with Brendia Calton Squires, PA  Medication Sig Dispense Refill  . acetaminophen  (TYLENOL ) 325 MG tablet Take by mouth    . allopurinoL  (ZYLOPRIM ) 100 MG tablet Take 100 mg by mouth once daily    . AMIOdarone  (PACERONE ) 200 MG tablet Take by mouth Take 1 tablet (200 mg total) by mouth daily    . benzonatate (TESSALON) 100 MG capsule Take by mouth    . carboxymethylcellulose (REFRESH TEARS) 0.5 % ophthalmic solution Place 1-2 drops into both eyes as needed for Dry Eyes    . docusate (COLACE) 100 MG capsule Take 100 mg by  mouth 2 (two) times daily       . ELIQUIS  2.5 mg tablet Take 2.5 mg by mouth 2 (two) times daily    . ergocalciferol , vitamin D2, 10 mcg (400 unit) Tab Take by mouth Take 400 Units by mouth in the morning and at bedtime.    . famotidine  (PEPCID ) 10 MG tablet Take 10 mg by mouth once daily    . fluticasone  (FLONASE  SENSIMIST) 27.5 mcg/actuation nasal spray Place 1 spray into one nostril    .  fluticasone  (FLONASE  SENSIMIST) 27.5 mcg/actuation nasal spray Place 1 spray into one nostril    . FUROsemide  (LASIX ) 40 MG tablet Take 1 tablet (40 mg total) by mouth 2 (two) times daily (Patient taking differently: Take 20 mg by mouth once daily) 60 tablet 0  . ibandronate (BONIVA) 150 mg tablet Take 1 tablet (150 mg total) by mouth every 30 (thirty) days Take with a full glass of water . Do not lie down for the next 60 min. 3 tablet 3  . insulin  regular human (NOVOLIN R FLEXPEN) 100 unit/mL (3 mL) InPn Inject subcutaneously Sig: CHECK SUGARS BEFORE MEAL TIME & TAKE INSULIN  BASED ON SLIDING SCALE: <70=NO INSULIN , EAT A SNACK AND RECHECK SUGAR SOON. 70-120=0U, 121-150=2U, 151-200=4U, 201-250=5U, 251-300=6U, 301-350=8U, 351-400=10U, >400=CALL MD    . LANTUS  U-100 INSULIN  injection (concentration 100 units/mL) INJECT 14 UNITS SUBCUTANEOUSLY ONCE DAILY (Patient taking differently: Inject 17 Units subcutaneously once daily) 10 mL 0  . levothyroxine  (SYNTHROID ) 75 MCG tablet Take 75 mcg by mouth once daily    . magnesium  hydroxide (MILK OF MAGNESIA) 400 mg/5 mL suspension Take 30 mLs by mouth once daily as needed    . midodrine  (PROAMATINE ) 5 MG tablet     . multivit-minerals/folic acid  (ADULT ONE DAILY MULTIVITAMIN ORAL) Take 1 tablet by mouth once daily    . NYAMYC  100,000 unit/gram powder     . pantoprazole  (PROTONIX ) 40 MG DR tablet Take 40 mg by mouth 2 (two) times daily before meals    . rOPINIRole  (REQUIP ) 0.5 MG tablet Take 0.25 mg by mouth at bedtime    . sennosides (SENOKOT) 8.6 mg tablet Take by mouth at  bedtime    . sertraline  (ZOLOFT ) 50 MG tablet Take 100 mg by mouth once daily    . solifenacin (VESICARE) 5 MG tablet Take 5 mg by mouth once daily    . sucralfate  (CARAFATE ) 1 gram tablet Take 1 g by mouth 4 (four) times daily    . traZODone  (DESYREL ) 50 MG tablet TAKE 1 TO 2 TABLETS BY MOUTH AT BEDTIME AS NEEDED 60 tablet 0    Review of Systems: GENERAL:  Patient denies weight-loss, weight-gain or fevers.  Patient denies fatigue. NEUROLOGIC:  Patient denies headaches. EYES:  Patient denies blurred vision. NECK:  Patient denies neck swelling, neck pain or difficulty with swallowing. CARDIAC:  Patient denies chest pain and palpitations. RESPIRATORY:  Patient endorses SOB.  Patient denies a cough.   GASTROINTESTINAL:  Patient denies abdominal pain, nausea and vomiting and constipation. GENITOURINARY:  Patient denies dysuria and hematuria. Endorses urinating frequently EXTREMITIES:  Patient denies leg swelling. HEME:  Patient endorses easy bruisability. SKIN:  Patient denies any rash.   Exam: BP 124/72   Pulse 74   Ht 157.5 cm (5' 2)   Wt 70.3 kg (155 lb)   SpO2 98%   BMI 28.35 kg/m  GEN: well developed, well nourished, in NAD. HEENT: No proptosis. EOMI. No lid lag or stare.  NECK: supple, trachea midline, no thyromegaly. RESPIRATORY: clear bilaterally,  good inspiratory effort. CV:  RRR. MUSCULOSKELETAL: normal gait and station, no clubbing, no tremor EXT: no edema SKIN: no dermatopathy or rash. A bilateral bare foot exam shows no sores or calluses. Toenails are trimmed and well kempt NEURO: PERRL. Monofilament testing of both feet is normal. PSYC: alert and oriented, good insight   Labs 02/14/2024: A1c = 8.1% per records from pcp.   Assessment: 1. Type 2 diabetes mellitus with diabetic microalbuminuria, with long-term current use of insulin  (  CMS/HHS-HCC)   2. Type 2 diabetes mellitus with stage 3b chronic kidney disease, with long-term current use of insulin  (CMS/HHS-HCC)    3. Hypertension associated with type 2 diabetes mellitus (CMS/HHS-HCC)   4. Hyperlipidemia associated with type 2 diabetes mellitus (CMS/HHS-HCC)   5. Acquired hypothyroidism   6. Age-related osteoporosis without current pathological fracture     Plan: -  We discussed diabetes, its complications, and goals of care. Patient was advised of the importance of diet and behavioral changes to improve glycemic control. Patient was advised of the importance of a low carbohydrate diet. Patient was advised of the importance of achieving and maintaining a healthy body weight. Recommended more diabetic friendly diet at the facility and avoidance of concentrated sugars. -  Unfortunately, I am unable to assess current glycemic control. I requested that she ask her pcp at the facility to fax us  the results of her recent blood draw. For now, continue Lantus  17 units QD and Novolin R per sliding scale instructions.  -  Discussed the importance of monitoring her blood sugars closely, especially in the setting of using multiple shots of insulin  in a day. Advised patient to consider use of the [employer] Libre 3+ glucose sensor. Discussed what this device is, how it can be used, and limitations. Patient agreed and prescription for the reader and sensor were sent to [facility] today. She states she can have her son help her place/pair her sensors to her manual receiver. Reminded to bring blood sugar log and/or receiver to every visit for download and review. -  Continue follow up with nephrology as planned. -  Continue annual eye exams. She is up to date on this. Will request a copy of her most recent eye exam today. -  Hypothyroidism and osteoporosis are managed by her pcp. Will defer care to them unless patient desires. -  Follow up in 6-8 weeks, or sooner should any other issues arise, WITH new Libre 3 reader to review CGMS data.  Total time spent in patient care today 56 minutes.  Most of it was spent in face to face  consultation but a significant amount was spent in chart review and some in documentation.  ADDENDUM: 07/05/24: Patient called with concerns about high blood sugars after a meal.  It was unclear whether or not she had her insulin  with breakfast. If so, Novolin R scale was increased by an additional +2 units now:   70 - 120, skip dose. 121 - 150, take 4 units. 151 - 200, take 6 units. 201 - 250, take 7 units. 251 - 300, take 8 units. 301 - 350, take 10 units. Over 351, take 12 units.   Attestation Statement:   I personally performed the service, non-incident to. (WP)   CASSANDRA BUEL COHN, PA    cc:  Cordella Corning, NP [id number] [facility] [address] [place],  [place] [address]

## 2024-05-25 ENCOUNTER — Inpatient Hospital Stay
Admission: EM | Admit: 2024-05-25 | Discharge: 2024-05-31 | DRG: 393 | Disposition: A | Discharge status: Skilled Nursing Facility | Source: Skilled Nursing Facility | Attending: Internal Medicine | Admitting: Internal Medicine

## 2024-05-25 ENCOUNTER — Emergency Department

## 2024-05-25 ENCOUNTER — Other Ambulatory Visit: Payer: Self-pay

## 2024-05-25 ENCOUNTER — Encounter: Payer: Self-pay | Admitting: Emergency Medicine

## 2024-05-25 DIAGNOSIS — I480 Paroxysmal atrial fibrillation: Secondary | ICD-10-CM | POA: Diagnosis present

## 2024-05-25 DIAGNOSIS — N184 Chronic kidney disease, stage 4 (severe): Secondary | ICD-10-CM | POA: Diagnosis present

## 2024-05-25 DIAGNOSIS — G2581 Restless legs syndrome: Secondary | ICD-10-CM | POA: Diagnosis present

## 2024-05-25 DIAGNOSIS — Z981 Arthrodesis status: Secondary | ICD-10-CM

## 2024-05-25 DIAGNOSIS — K2971 Gastritis, unspecified, with bleeding: Secondary | ICD-10-CM | POA: Diagnosis present

## 2024-05-25 DIAGNOSIS — Z79899 Other long term (current) drug therapy: Secondary | ICD-10-CM

## 2024-05-25 DIAGNOSIS — R110 Nausea: Secondary | ICD-10-CM | POA: Diagnosis not present

## 2024-05-25 DIAGNOSIS — Z66 Do not resuscitate: Secondary | ICD-10-CM | POA: Diagnosis present

## 2024-05-25 DIAGNOSIS — G8929 Other chronic pain: Secondary | ICD-10-CM | POA: Diagnosis present

## 2024-05-25 DIAGNOSIS — I2510 Atherosclerotic heart disease of native coronary artery without angina pectoris: Secondary | ICD-10-CM | POA: Diagnosis present

## 2024-05-25 DIAGNOSIS — R7989 Other specified abnormal findings of blood chemistry: Secondary | ICD-10-CM | POA: Diagnosis not present

## 2024-05-25 DIAGNOSIS — E039 Hypothyroidism, unspecified: Secondary | ICD-10-CM | POA: Diagnosis present

## 2024-05-25 DIAGNOSIS — F411 Generalized anxiety disorder: Secondary | ICD-10-CM | POA: Diagnosis present

## 2024-05-25 DIAGNOSIS — Z840 Family history of diseases of the skin and subcutaneous tissue: Secondary | ICD-10-CM

## 2024-05-25 DIAGNOSIS — Z823 Family history of stroke: Secondary | ICD-10-CM

## 2024-05-25 DIAGNOSIS — E1122 Type 2 diabetes mellitus with diabetic chronic kidney disease: Secondary | ICD-10-CM | POA: Diagnosis present

## 2024-05-25 DIAGNOSIS — I700 Atherosclerosis of aorta: Secondary | ICD-10-CM | POA: Diagnosis present

## 2024-05-25 DIAGNOSIS — Z794 Long term (current) use of insulin: Secondary | ICD-10-CM | POA: Diagnosis not present

## 2024-05-25 DIAGNOSIS — F02B3 Dementia in other diseases classified elsewhere, moderate, with mood disturbance: Secondary | ICD-10-CM | POA: Diagnosis present

## 2024-05-25 DIAGNOSIS — R7401 Elevation of levels of liver transaminase levels: Principal | ICD-10-CM

## 2024-05-25 DIAGNOSIS — F01B4 Vascular dementia, moderate, with anxiety: Secondary | ICD-10-CM | POA: Diagnosis present

## 2024-05-25 DIAGNOSIS — E119 Type 2 diabetes mellitus without complications: Secondary | ICD-10-CM

## 2024-05-25 DIAGNOSIS — Z96653 Presence of artificial knee joint, bilateral: Secondary | ICD-10-CM | POA: Diagnosis present

## 2024-05-25 DIAGNOSIS — I130 Hypertensive heart and chronic kidney disease with heart failure and stage 1 through stage 4 chronic kidney disease, or unspecified chronic kidney disease: Secondary | ICD-10-CM | POA: Diagnosis present

## 2024-05-25 DIAGNOSIS — Z885 Allergy status to narcotic agent status: Secondary | ICD-10-CM

## 2024-05-25 DIAGNOSIS — K2950 Unspecified chronic gastritis without bleeding: Secondary | ICD-10-CM | POA: Diagnosis not present

## 2024-05-25 DIAGNOSIS — E1149 Type 2 diabetes mellitus with other diabetic neurological complication: Secondary | ICD-10-CM | POA: Diagnosis not present

## 2024-05-25 DIAGNOSIS — Z7901 Long term (current) use of anticoagulants: Secondary | ICD-10-CM

## 2024-05-25 DIAGNOSIS — Z8673 Personal history of transient ischemic attack (TIA), and cerebral infarction without residual deficits: Secondary | ICD-10-CM

## 2024-05-25 DIAGNOSIS — Z91013 Allergy to seafood: Secondary | ICD-10-CM

## 2024-05-25 DIAGNOSIS — Z91041 Radiographic dye allergy status: Secondary | ICD-10-CM

## 2024-05-25 DIAGNOSIS — Z9049 Acquired absence of other specified parts of digestive tract: Secondary | ICD-10-CM

## 2024-05-25 DIAGNOSIS — Z8249 Family history of ischemic heart disease and other diseases of the circulatory system: Secondary | ICD-10-CM

## 2024-05-25 DIAGNOSIS — F32A Depression, unspecified: Secondary | ICD-10-CM | POA: Insufficient documentation

## 2024-05-25 DIAGNOSIS — F03B0 Unspecified dementia, moderate, without behavioral disturbance, psychotic disturbance, mood disturbance, and anxiety: Secondary | ICD-10-CM | POA: Diagnosis present

## 2024-05-25 DIAGNOSIS — Z79890 Hormone replacement therapy (postmenopausal): Secondary | ICD-10-CM

## 2024-05-25 DIAGNOSIS — G309 Alzheimer's disease, unspecified: Secondary | ICD-10-CM | POA: Diagnosis present

## 2024-05-25 DIAGNOSIS — N179 Acute kidney failure, unspecified: Secondary | ICD-10-CM | POA: Diagnosis present

## 2024-05-25 DIAGNOSIS — Z950 Presence of cardiac pacemaker: Secondary | ICD-10-CM | POA: Diagnosis present

## 2024-05-25 DIAGNOSIS — R112 Nausea with vomiting, unspecified: Secondary | ICD-10-CM | POA: Diagnosis not present

## 2024-05-25 DIAGNOSIS — I5032 Chronic diastolic (congestive) heart failure: Secondary | ICD-10-CM | POA: Diagnosis present

## 2024-05-25 DIAGNOSIS — R0602 Shortness of breath: Secondary | ICD-10-CM | POA: Diagnosis present

## 2024-05-25 DIAGNOSIS — Z888 Allergy status to other drugs, medicaments and biological substances status: Secondary | ICD-10-CM

## 2024-05-25 DIAGNOSIS — N1832 Chronic kidney disease, stage 3b: Secondary | ICD-10-CM | POA: Insufficient documentation

## 2024-05-25 DIAGNOSIS — R748 Abnormal levels of other serum enzymes: Secondary | ICD-10-CM | POA: Diagnosis not present

## 2024-05-25 DIAGNOSIS — F01B3 Vascular dementia, moderate, with mood disturbance: Secondary | ICD-10-CM | POA: Diagnosis present

## 2024-05-25 DIAGNOSIS — K317 Polyp of stomach and duodenum: Secondary | ICD-10-CM | POA: Diagnosis present

## 2024-05-25 DIAGNOSIS — M1990 Unspecified osteoarthritis, unspecified site: Secondary | ICD-10-CM | POA: Diagnosis present

## 2024-05-25 DIAGNOSIS — Z7983 Long term (current) use of bisphosphonates: Secondary | ICD-10-CM

## 2024-05-25 DIAGNOSIS — K760 Fatty (change of) liver, not elsewhere classified: Secondary | ICD-10-CM | POA: Diagnosis present

## 2024-05-25 DIAGNOSIS — I272 Pulmonary hypertension, unspecified: Secondary | ICD-10-CM | POA: Diagnosis present

## 2024-05-25 DIAGNOSIS — M109 Gout, unspecified: Secondary | ICD-10-CM | POA: Diagnosis present

## 2024-05-25 DIAGNOSIS — K449 Diaphragmatic hernia without obstruction or gangrene: Secondary | ICD-10-CM | POA: Diagnosis present

## 2024-05-25 DIAGNOSIS — Z833 Family history of diabetes mellitus: Secondary | ICD-10-CM

## 2024-05-25 DIAGNOSIS — Z886 Allergy status to analgesic agent status: Secondary | ICD-10-CM

## 2024-05-25 DIAGNOSIS — Z882 Allergy status to sulfonamides status: Secondary | ICD-10-CM

## 2024-05-25 DIAGNOSIS — M810 Age-related osteoporosis without current pathological fracture: Secondary | ICD-10-CM | POA: Diagnosis present

## 2024-05-25 LAB — COMPREHENSIVE METABOLIC PANEL WITH GFR
ALT: 73 U/L — ABNORMAL HIGH (ref 0–44)
AST: 106 U/L — ABNORMAL HIGH (ref 15–41)
Albumin: 4 g/dL (ref 3.5–5.0)
Alkaline Phosphatase: 134 U/L — ABNORMAL HIGH (ref 38–126)
Anion gap: 13 (ref 5–15)
BUN: 57 mg/dL — ABNORMAL HIGH (ref 8–23)
CO2: 21 mmol/L — ABNORMAL LOW (ref 22–32)
Calcium: 8.8 mg/dL — ABNORMAL LOW (ref 8.9–10.3)
Chloride: 103 mmol/L (ref 98–111)
Creatinine, Ser: 1.63 mg/dL — ABNORMAL HIGH (ref 0.44–1.00)
GFR, Estimated: 31 mL/min — ABNORMAL LOW (ref >60)
Glucose, Bld: 337 mg/dL — ABNORMAL HIGH (ref 70–99)
Potassium: 4.8 mmol/L (ref 3.5–5.1)
Sodium: 137 mmol/L (ref 135–145)
Total Bilirubin: 1.8 mg/dL — ABNORMAL HIGH (ref 0.0–1.2)
Total Protein: 7.8 g/dL (ref 6.5–8.1)

## 2024-05-25 LAB — CBC WITH DIFFERENTIAL/PLATELET
Abs Immature Granulocytes: 0.02 K/uL (ref 0.00–0.07)
Basophils Absolute: 0 K/uL (ref 0.0–0.1)
Basophils Relative: 0 %
Eosinophils Absolute: 0 K/uL (ref 0.0–0.5)
Eosinophils Relative: 0 %
HCT: 44 % (ref 36.0–46.0)
Hemoglobin: 14.1 g/dL (ref 12.0–15.0)
Immature Granulocytes: 0 %
Lymphocytes Relative: 8 %
Lymphs Abs: 0.6 K/uL — ABNORMAL LOW (ref 0.7–4.0)
MCH: 32.6 pg (ref 26.0–34.0)
MCHC: 32 g/dL (ref 30.0–36.0)
MCV: 101.6 fL — ABNORMAL HIGH (ref 80.0–100.0)
Monocytes Absolute: 0.4 K/uL (ref 0.1–1.0)
Monocytes Relative: 5 %
Neutro Abs: 6.2 K/uL (ref 1.7–7.7)
Neutrophils Relative %: 87 %
Platelets: 98 K/uL — ABNORMAL LOW (ref 150–400)
RBC: 4.33 MIL/uL (ref 3.87–5.11)
RDW: 12.5 % (ref 11.5–15.5)
Smear Review: NORMAL
WBC: 7.2 K/uL (ref 4.0–10.5)
nRBC: 0 % (ref 0.0–0.2)

## 2024-05-25 LAB — TROPONIN I (HIGH SENSITIVITY)
Troponin I (High Sensitivity): 13 ng/L (ref <18)
Troponin I (High Sensitivity): 14 ng/L (ref <18)

## 2024-05-25 LAB — RESP PANEL BY RT-PCR (RSV, FLU A&B, COVID)  RVPGX2
Influenza A by PCR: NEGATIVE
Influenza B by PCR: NEGATIVE
Resp Syncytial Virus by PCR: NEGATIVE
SARS Coronavirus 2 by RT PCR: NEGATIVE

## 2024-05-25 LAB — BRAIN NATRIURETIC PEPTIDE: B Natriuretic Peptide: 262.5 pg/mL — ABNORMAL HIGH (ref 0.0–100.0)

## 2024-05-25 LAB — MAGNESIUM: Magnesium: 1.9 mg/dL (ref 1.7–2.4)

## 2024-05-25 LAB — LIPASE, BLOOD: Lipase: 30 U/L (ref 11–51)

## 2024-05-25 LAB — GLUCOSE, CAPILLARY: Glucose-Capillary: 265 mg/dL — ABNORMAL HIGH (ref 70–99)

## 2024-05-25 MED ORDER — VITAMIN D2 50 MCG (2000 UT) PO TABS
1.0000 | ORAL_TABLET | Freq: Every day | ORAL | Status: DC
Start: 2024-05-25 — End: 2024-05-25

## 2024-05-25 MED ORDER — ACETAMINOPHEN 650 MG RE SUPP: 650.0000 mg | Freq: Four times a day (QID) | RECTAL | Status: DC | PRN

## 2024-05-25 MED ORDER — AMIODARONE HCL 200 MG PO TABS
100.0000 mg | ORAL_TABLET | Freq: Every day | ORAL | Status: DC
  Administered 2024-05-26 – 2024-05-31 (×6): 100 mg via ORAL
  Filled 2024-05-25 (×6): qty 1

## 2024-05-25 MED ORDER — ALUM & MAG HYDROXIDE-SIMETH 200-200-20 MG/5ML PO SUSP
10.0000 mL | Freq: Three times a day (TID) | ORAL | Status: DC | PRN
  Administered 2024-05-26 – 2024-05-29 (×2): 10 mL via ORAL
  Filled 2024-05-25 (×2): qty 30

## 2024-05-25 MED ORDER — ROPINIROLE HCL 0.25 MG PO TABS
0.5000 mg | ORAL_TABLET | Freq: Every day | ORAL | Status: DC
  Administered 2024-05-25 – 2024-05-30 (×6): 0.5 mg via ORAL
  Filled 2024-05-25 (×7): qty 2

## 2024-05-25 MED ORDER — HYDROMORPHONE HCL 1 MG/ML IJ SOLN
0.5000 mg | Freq: Once | INTRAMUSCULAR | Status: AC
  Administered 2024-05-25: 0.5 mg via INTRAVENOUS
  Filled 2024-05-25: qty 0.5

## 2024-05-25 MED ORDER — ALUM & MAG HYDROXIDE-SIMETH 200-200-20 MG/5ML PO SUSP
30.0000 mL | Freq: Once | ORAL | Status: AC
  Administered 2024-05-25: 30 mL via ORAL
  Filled 2024-05-25: qty 30

## 2024-05-25 MED ORDER — LIDOCAINE VISCOUS HCL 2 % MT SOLN
15.0000 mL | Freq: Once | OROMUCOSAL | Status: AC
  Administered 2024-05-25: 15 mL via OROMUCOSAL
  Filled 2024-05-25: qty 15

## 2024-05-25 MED ORDER — FUROSEMIDE 20 MG PO TABS
20.0000 mg | ORAL_TABLET | Freq: Every day | ORAL | Status: DC
  Administered 2024-05-26 – 2024-05-31 (×6): 20 mg via ORAL
  Filled 2024-05-25 (×6): qty 1

## 2024-05-25 MED ORDER — INSULIN GLARGINE 100 UNIT/ML ~~LOC~~ SOLN
19.0000 [IU] | Freq: Every day | SUBCUTANEOUS | Status: DC
  Administered 2024-05-26 (×2): 19 [IU] via SUBCUTANEOUS
  Filled 2024-05-25 (×3): qty 0.19

## 2024-05-25 MED ORDER — LEVOTHYROXINE SODIUM 25 MCG PO TABS
37.5000 ug | ORAL_TABLET | Freq: Every day | ORAL | Status: DC
  Administered 2024-05-26 – 2024-05-31 (×6): 37.5 ug via ORAL
  Filled 2024-05-25: qty 2
  Filled 2024-05-25: qty 0.5
  Filled 2024-05-25 (×5): qty 2

## 2024-05-25 MED ORDER — POLYSACCHARIDE IRON COMPLEX 150 MG PO CAPS
150.0000 mg | ORAL_CAPSULE | Freq: Every day | ORAL | Status: DC
  Administered 2024-05-26 – 2024-05-31 (×6): 150 mg via ORAL
  Filled 2024-05-25 (×6): qty 1

## 2024-05-25 MED ORDER — SERTRALINE HCL 50 MG PO TABS
100.0000 mg | ORAL_TABLET | Freq: Every morning | ORAL | Status: DC
  Administered 2024-05-26 – 2024-05-31 (×6): 100 mg via ORAL
  Filled 2024-05-25 (×6): qty 2

## 2024-05-25 MED ORDER — LORATADINE 10 MG PO TABS
10.0000 mg | ORAL_TABLET | Freq: Every day | ORAL | Status: DC
  Administered 2024-05-26 – 2024-05-31 (×6): 10 mg via ORAL
  Filled 2024-05-25 (×6): qty 1

## 2024-05-25 MED ORDER — FESOTERODINE FUMARATE ER 4 MG PO TB24
4.0000 mg | ORAL_TABLET | Freq: Every day | ORAL | Status: DC
Start: 2024-05-26 — End: 2024-05-31
  Administered 2024-05-26 – 2024-05-31 (×6): 4 mg via ORAL
  Filled 2024-05-25 (×6): qty 1

## 2024-05-25 MED ORDER — MORPHINE SULFATE (PF) 2 MG/ML IV SOLN
2.0000 mg | Freq: Once | INTRAVENOUS | Status: AC
  Administered 2024-05-25: 2 mg via INTRAVENOUS
  Filled 2024-05-25: qty 1

## 2024-05-25 MED ORDER — THIAMINE MONONITRATE 100 MG PO TABS
100.0000 mg | ORAL_TABLET | Freq: Every day | ORAL | Status: DC
  Administered 2024-05-26 – 2024-05-31 (×6): 100 mg via ORAL
  Filled 2024-05-25 (×6): qty 1

## 2024-05-25 MED ORDER — APIXABAN 2.5 MG PO TABS
2.5000 mg | ORAL_TABLET | Freq: Two times a day (BID) | ORAL | Status: DC
Start: 2024-05-25 — End: 2024-05-26
  Administered 2024-05-26 (×2): 2.5 mg via ORAL
  Filled 2024-05-25 (×2): qty 1

## 2024-05-25 MED ORDER — PANTOPRAZOLE SODIUM 40 MG PO TBEC
40.0000 mg | DELAYED_RELEASE_TABLET | Freq: Two times a day (BID) | ORAL | Status: DC
Start: 2024-05-26 — End: 2024-05-26
  Administered 2024-05-26: 40 mg via ORAL
  Filled 2024-05-25: qty 1

## 2024-05-25 MED ORDER — ALBUTEROL SULFATE (2.5 MG/3ML) 0.083% IN NEBU: 2.5000 mg | INHALATION_SOLUTION | Freq: Four times a day (QID) | RESPIRATORY_TRACT | Status: DC | PRN

## 2024-05-25 MED ORDER — ADULT MULTIVITAMIN W/MINERALS CH
1.0000 | ORAL_TABLET | Freq: Every day | ORAL | Status: DC
  Administered 2024-05-26 – 2024-05-31 (×6): 1 via ORAL
  Filled 2024-05-25 (×6): qty 1

## 2024-05-25 MED ORDER — ACETAMINOPHEN 325 MG PO TABS
650.0000 mg | ORAL_TABLET | Freq: Four times a day (QID) | ORAL | Status: DC | PRN
  Administered 2024-05-30: 650 mg via ORAL
  Filled 2024-05-25: qty 2

## 2024-05-25 MED ORDER — LACTATED RINGERS IV BOLUS
500.0000 mL | Freq: Once | INTRAVENOUS | Status: AC
  Administered 2024-05-25: 500 mL via INTRAVENOUS

## 2024-05-25 MED ORDER — FAMOTIDINE IN NACL 20-0.9 MG/50ML-% IV SOLN
20.0000 mg | Freq: Once | INTRAVENOUS | Status: AC
  Administered 2024-05-25: 20 mg via INTRAVENOUS
  Filled 2024-05-25: qty 50

## 2024-05-25 MED ORDER — GABAPENTIN 300 MG PO CAPS
600.0000 mg | ORAL_CAPSULE | Freq: Three times a day (TID) | ORAL | Status: DC
Start: 2024-05-25 — End: 2024-05-28
  Filled 2024-05-25 (×6): qty 2

## 2024-05-25 MED ORDER — MAGNESIUM HYDROXIDE 400 MG/5ML PO SUSP: 30.0000 mL | Freq: Every day | ORAL | Status: DC | PRN

## 2024-05-25 MED ORDER — HYDROMORPHONE HCL 1 MG/ML IJ SOLN
1.0000 mg | INTRAMUSCULAR | Status: DC | PRN
  Administered 2024-05-25 – 2024-05-27 (×2): 1 mg via INTRAVENOUS
  Filled 2024-05-25 (×2): qty 1

## 2024-05-25 MED ORDER — ALLOPURINOL 100 MG PO TABS
100.0000 mg | ORAL_TABLET | Freq: Every morning | ORAL | Status: DC
  Administered 2024-05-26 – 2024-05-31 (×6): 100 mg via ORAL
  Filled 2024-05-25 (×7): qty 1

## 2024-05-25 MED ORDER — FAMOTIDINE 20 MG PO TABS
20.0000 mg | ORAL_TABLET | Freq: Two times a day (BID) | ORAL | Status: DC
Start: 2024-05-25 — End: 2024-05-26
  Administered 2024-05-25 – 2024-05-26 (×2): 20 mg via ORAL
  Filled 2024-05-25 (×2): qty 1

## 2024-05-25 MED ORDER — ONDANSETRON HCL 4 MG/2ML IJ SOLN
4.0000 mg | Freq: Once | INTRAMUSCULAR | Status: AC
  Administered 2024-05-25: 4 mg via INTRAVENOUS
  Filled 2024-05-25: qty 2

## 2024-05-25 MED ORDER — VITAMIN D 25 MCG (1000 UNIT) PO TABS
2000.0000 [IU] | ORAL_TABLET | Freq: Every morning | ORAL | Status: DC
  Administered 2024-05-26 – 2024-05-31 (×6): 2000 [IU] via ORAL
  Filled 2024-05-25 (×6): qty 2

## 2024-05-25 MED ORDER — POLYETHYLENE GLYCOL 3350 17 G PO PACK
17.0000 g | PACK | Freq: Every day | ORAL | Status: DC
  Administered 2024-05-26 – 2024-05-31 (×5): 17 g via ORAL
  Filled 2024-05-25 (×5): qty 1

## 2024-05-25 MED ORDER — SERTRALINE HCL 50 MG PO TABS
25.0000 mg | ORAL_TABLET | Freq: Every morning | ORAL | Status: DC
Start: 2024-05-26 — End: 2024-05-26
  Administered 2024-05-26: 25 mg via ORAL
  Filled 2024-05-25: qty 1

## 2024-05-25 MED ORDER — SENNA 8.6 MG PO TABS: 1.0000 | ORAL_TABLET | Freq: Every evening | ORAL | Status: DC | PRN

## 2024-05-25 MED ORDER — SODIUM CHLORIDE 0.9 % IV SOLN: INTRAVENOUS | Status: DC

## 2024-05-25 MED ORDER — SUCRALFATE 1 G PO TABS
1.0000 g | ORAL_TABLET | Freq: Four times a day (QID) | ORAL | Status: DC
  Administered 2024-05-25 – 2024-05-31 (×20): 1 g via ORAL
  Filled 2024-05-25 (×22): qty 1

## 2024-05-25 MED ORDER — ONDANSETRON HCL 4 MG/2ML IJ SOLN
4.0000 mg | Freq: Four times a day (QID) | INTRAMUSCULAR | Status: DC | PRN
  Administered 2024-05-26 – 2024-05-29 (×4): 4 mg via INTRAVENOUS
  Filled 2024-05-25 (×4): qty 2

## 2024-05-25 MED ORDER — TRAZODONE HCL 50 MG PO TABS
25.0000 mg | ORAL_TABLET | Freq: Every evening | ORAL | Status: DC | PRN
  Administered 2024-05-25: 25 mg via ORAL
  Filled 2024-05-25: qty 1

## 2024-05-25 MED ORDER — ONDANSETRON HCL 4 MG PO TABS: 4.0000 mg | ORAL_TABLET | Freq: Four times a day (QID) | ORAL | Status: DC | PRN

## 2024-05-25 NOTE — ED Triage Notes (Signed)
 Pt from [facility] with reports of SHOB and nausea. Pt has been refusing her medications at the facility. Fire department reports initial O2 of 55%, pt was palced on non-rebreather. Ems reports when they arrived on scene her O2 was 98% on RA.

## 2024-05-25 NOTE — Assessment & Plan Note (Signed)
Will continue Zoloft. 

## 2024-05-25 NOTE — Assessment & Plan Note (Signed)
Will continue Eliquis. 

## 2024-05-25 NOTE — H&P (Signed)
 Deborah Obrien   PATIENT NAME: Deborah Obrien    MR#:  [id number]  DATE OF BIRTH:  [birth year]  DATE OF ADMISSION:  05/25/2024  PRIMARY CARE PHYSICIAN: Cordella Corning, FNP   Patient is coming from: SNF  REQUESTING/REFERRING PHYSICIAN: Claudene Rover, MD  CHIEF COMPLAINT:   Chief Complaint  Patient presents with   Shortness of Breath    HISTORY OF PRESENT ILLNESS:  Deborah Obrien is a 83 y.o. Caucasian homeless female with medical history significant for anxiety, carotid artery stenosis, chronic heart failure with preserved EF, coronary artery disease, hypertension, and hypothyroidism, who presented to the emergency room with acute onset of dyspnea initially as well as recurrent nausea and vomiting with epigastric abdominal pain over the last 12 hours.  She was not dyspneic during my interview.  Despite given Zofran  at her SNF she continued to have recurrent vomiting.  No fever or chills.  No chest pain or palpitations.  No cough or wheezing or dyspnea.  No dysuria, oliguria or hematuria or flank pain.  ED Course: When she came to the ER, BP was 128/51, temperature 99.4, respiratory rate of 21 with otherwise normal vital signs.  Labs revealed CO2 of 21 and blood glucose of 337, BUN of 57 and creatinine 1.63 compared to 47 and 1.44.  Calcium  was 8.8.  Alkaline phosphatase was 134 and AST 106 with ALT of 73.  Total bili was 1.8 and BNP 262.5.  High-sensitivity troponin I was 13.  CBC showed macrocytosis and thrombocytopenia of 98. EKG as reviewed by me :  EKG showed normal sinus rhythm with a rate of 64 with low voltage QRS and Q waves anteroseptally. Imaging: Portable chest x-ray showed pulmonary venous congestion with no overt edema.  Showed aortic atherosclerosis.  Abdominal pelvic CT scan showed moderate colonic stool burden with no CT evidence of for appendicitis.  Right upper quad ultrasound revealed unremarkable right upper quadrant ultrasound in a patient who is status  postcholecystectomy.  The patient was given 4 mg of IV Zofran , 2 mg of IV morphine  sulfate 2% viscous lidocaine  and 500 mL IV lactated ringer  bolus, 0.5 mg of IV Dilaudid  and 20 mg of IV Pepcid  as well as Maalox 30 mL p.o.  She will be admitted to a medical telemetry bed for further evaluation and management. PAST MEDICAL HISTORY:   Past Medical History:  Diagnosis Date   Acute respiratory failure (HCC) 04/09/2018   Anemia    Anxiety    Aortic atherosclerosis (HCC)    Bowel obstruction (HCC)    Carotid artery disease (HCC)    Cerebral microvascular disease    Chronic heart failure with preserved ejection fraction (HFpEF) (HCC)    a.) TTE 04/10/2018: EF 60-65%, mildly dil LA/RA, mod dil RV, mod TR, PASP ; b.) TTE 01/22/2023: EF 60-65%, no rwma, low-nl RV fxn, RVSP 51.2 mmHg. Mild MR. Mod-sev TR.   CKD (chronic kidney disease), stage IV (HCC)    Closed fracture of one rib of right side 05/29/2020   Colon polyp    Complete tear of left rotator cuff 10/16/2014   Coronary artery disease (non-obstructive) 08/25/2016   a.) LHC 08/25/2016:  25% oLM-LM, 20% mLAD, 15% o-pLCx - med mgmt; b.) MV 10/20/2017: no isch/infarct, EF 55-65%   DDD (degenerative disc disease), cervical    a.) s/p ACDF C6-C7   Depression    Diabetic neuropathy (HCC)    Diarrhea 03/04/2022   Displaced intertrochanteric fracture of right femur (HCC)  Fatty liver    Gastritis without bleeding    GERD (gastroesophageal reflux disease)    GI bleed    Gout    Hypertension    Hypothyroidism    Intractable nausea and vomiting 05/25/2024   Long term current use of amiodarone     Low back pain    history of kyphoplasty t12-l1   Morbid obesity (HCC)    Multiple gastric polyps    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   a.) presurgical PCR (+) 10/10/2023 prior to REMOVAL OF NAIL HARDWARE WITH SCREW EXCHANGE (RIGHT HIP)   On apixaban  therapy    Osteoarthritis    Osteoporosis    PAF (paroxysmal atrial fibrillation)  (HCC) 2016   a.) CHA2DS2-VASc = 9 (age x 2, sex, HFpEF, HTN, TIA x 2, vascular disease, T2DM) as of 10/13/2023; b.) s/p DCCV 06/28/2017 (120 J x1), 02/06/2018 (150c J x 1), 07/15/2020 (75 J x ); c.) ardiac rate/rhythm maintained on oral amiodarone ; chronically anticoagulated using apixaban    Presence of permanent cardiac pacemaker 07/01/2015   a.) PPM placed 07/01/2015; b.) device upgraded to dual chamber MDT Azure XT DR MRI SureScan (SN: [id number]) 08/29/2017   Pulmonary hypertension (HCC)    a.) TTE 04/10/2018: PASP 50 mmHg; b.) TTE 01/22/2023: RVSP 51.2 mmHg   Sinus node dysfunction (HCC)    a.) s/p PPM placement 06/2015 --> device upgraded 08/2017   T2DM (type 2 diabetes mellitus) (HCC)    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 plate & screws    BACK SURGERY  2019   patient describes kyphoplasty for compression fractures, MD referral said fusion   BREAST BIOPSY Right 04/2015   stereo fibroadenomatous change, neg for atypia   BREAST EXCISIONAL BIOPSY Left    neg   CARDIAC CATHETERIZATION Left 08/25/2016   Procedure: Left Heart Cath and Coronary Angiography;  Surgeon: Wolm JINNY Rhyme, MD;  Location: [facility] INVASIVE CV LAB;  Service: Cardiovascular;  Laterality: Left;   CARDIOVERSION N/A 06/28/2017   Procedure: CARDIOVERSION;  Surgeon: Rhyme Wolm JINNY, MD;  Location: [facility] ORS;  Service: Cardiovascular;  Laterality: N/A;   CARDIOVERSION N/A 02/06/2018   Procedure: CARDIOVERSION;  Surgeon: Delford Maude BROCKS, MD;  Location: [facility] ENDOSCOPY;  Service: Cardiovascular;  Laterality: N/A;   CARDIOVERSION N/A 07/15/2020   Procedure: CARDIOVERSION;  Surgeon: Jeffrie Oneil BROCKS, MD;  Location: [facility] ENDOSCOPY;  Service: Cardiovascular;  Laterality: N/A;   CATARACT EXTRACTION, BILATERAL  2012   CHOLECYSTECTOMY     colon blockage  1999   COLON SURGERY  2000   removed 20% of colon. colon had collapsed   COLONOSCOPY  2016   polyps removed 2016   COLONOSCOPY N/A 01/10/2024   Procedure: COLONOSCOPY;  Surgeon: Jinny Carmine, MD;  Location: [facility] ENDOSCOPY;  Service: Endoscopy;  Laterality: N/A;   DORSAL COMPARTMENT RELEASE Left 09/14/2016   Procedure: RELEASE DORSAL COMPARTMENT (DEQUERVAIN);  Surgeon: Lynwood SHAUNNA Hue, MD;  Location: [facility] ORS;  Service: Orthopedics;  Laterality: Left;   ESOPHAGOGASTRODUODENOSCOPY N/A 01/27/2015  Procedure: ESOPHAGOGASTRODUODENOSCOPY (EGD);  Surgeon: Gladis RAYMOND Mariner, MD;  Location: [facility] ENDOSCOPY;  Service: Endoscopy;  Laterality: N/A;   ESOPHAGOGASTRODUODENOSCOPY N/A 01/10/2024   Procedure: EGD (ESOPHAGOGASTRODUODENOSCOPY);  Surgeon: Jinny Carmine, MD;  Location[facility] ENDOSCOPY;  Service: Endoscopy;  Laterality: N/A;   ESOPHAGOGASTRODUODENOSCOPY (EGD) WITH PROPOFOL  N/A 07/24/2015   Procedure: ESOPHAGOGASTRODUODENOSCOPY (EGD) WITH PROPOFOL ;  Surgeon: Carmine Jinny, MD;  Location: [facility] ENDOSCOPY;  Service: Endoscopy;  Laterality: N/A;   ESOPHAGOGASTRODUODENOSCOPY (EGD) WITH PROPOFOL  N/A 04/12/2018   Procedure: ESOPHAGOGASTRODUODENOSCOPY (EGD) WITH PROPOFOL ;  Surgeon: Therisa Bi, MD;  Location: [facility] ENDOSCOPY;  Service: Gastroenterology;  Laterality: N/A;   ESOPHAGOGASTRODUODENOSCOPY (EGD) WITH PROPOFOL  N/A 06/22/2018   Procedure: ESOPHAGOGASTRODUODENOSCOPY (EGD) WITH PROPOFOL ;  Surgeon: Teressa Toribio SQUIBB, MD;  Location: [facility] ENDOSCOPY;  Service: Endoscopy;  Laterality: N/A;   ESOPHAGOGASTRODUODENOSCOPY (EGD) WITH PROPOFOL  N/A 07/09/2018   Procedure: ESOPHAGOGASTRODUODENOSCOPY (EGD) WITH  PROPOFOL  with resection of gastric polyps;  Surgeon: Therisa Bi, MD;  Location: [facility] ENDOSCOPY;  Service: Gastroenterology;  Laterality: N/A;   ESOPHAGOGASTRODUODENOSCOPY (EGD) WITH PROPOFOL  N/A 05/17/2019   Procedure: ESOPHAGOGASTRODUODENOSCOPY (EGD) WITH PROPOFOL ;  Surgeon: Therisa Bi, MD;  Location: [facility] ENDOSCOPY;  Service: Gastroenterology;  Laterality: N/A;   EUS N/A 06/22/2018   Procedure: UPPER ENDOSCOPIC ULTRASOUND (EUS) RADIAL;  Surgeon: Teressa Toribio SQUIBB, MD;  Location: [facility] ENDOSCOPY;  Service: Endoscopy;  Laterality: N/A;   EYE SURGERY Bilateral    cataract extractions   HARDWARE REMOVAL Right 10/17/2023   Procedure: Right hip removal of nail hardware with screw exchange;  Surgeon: Tobie Priest, MD;  Location: [facility] ORS;  Service: Orthopedics;  Laterality: Right;   HUMERUS IM NAIL Left 06/02/2022   Procedure: INTRAMEDULLARY (IM) NAIL HUMERAL;  Surgeon: Edie Norleen PARAS, MD;  Location: [facility] ORS;  Service: Orthopedics;  Laterality: Left;   INTRAMEDULLARY (IM) NAIL INTERTROCHANTERIC Right 01/21/2023   Procedure: INTRAMEDULLARY (IM) NAIL INTERTROCHANTERIC;  Surgeon: Tobie Priest, MD;  Location: [facility] ORS;  Service: Orthopedics;  Laterality: Right;   JOINT REPLACEMENT Bilateral 2014   Bilateral Knee replacement   LEAD REVISION/REPAIR N/A 08/29/2017   Procedure: LEAD REVISION/REPAIR;  Surgeon: Inocencio Soyla Gladis, MD;  Location: [facility] INVASIVE CV LAB;  Service: Cardiovascular;  Laterality: N/A;   OOPHORECTOMY     PACEMAKER INSERTION Left 07/01/2015   Procedure: INSERTION PACEMAKER;  Surgeon: Marsa Dooms, MD;  Location: [facility] ORS;  Service: Cardiovascular;  Laterality: Left;   PACEMAKER REVISION N/A 08/28/2017   Procedure: PACEMAKER REVISION;  Surgeon: Fernande Elspeth BROCKS, MD;  Location: [facility] INVASIVE CV LAB;  Service: Cardiovascular;  Laterality: N/A;   REPLACEMENT TOTAL KNEE BILATERAL     SHOULDER ARTHROSCOPY WITH ROTATOR CUFF REPAIR AND OPEN BICEPS TENODESIS Left 11/21/2019   Procedure: LEFT  SHOULDER ARTHROSCOPY WITH DEBRIDEMENT, DECOMPRESSION, ROTATOR CUFF REPAIR AND BICEPS TENOLYSIS;  Surgeon: Edie Norleen PARAS, MD;  Location: [facility] ORS;  Service: Orthopedics;  Laterality: Left;   SHOULDER ARTHROSCOPY WITH SUBACROMIAL DECOMPRESSION Left 2013   TEE WITHOUT CARDIOVERSION N/A 06/28/2017   Procedure: TRANSESOPHAGEAL ECHOCARDIOGRAM (TEE);  Surgeon: Hester Wolm PARAS, MD;  Location: [facility] ORS;  Service: Cardiovascular;  Laterality: N/A;   TUBAL LIGATION      SOCIAL HISTORY:   Social History   Tobacco Use   Smoking status: Never   Smokeless tobacco: Never  Substance Use Topics   Alcohol  use: No    FAMILY HISTORY:   Family History  Problem Relation Age of Onset   Diabetes Mellitus II Mother    CAD Mother    Heart attack Mother    Cancer Father  skin   Diabetes Mellitus II Brother    Stroke Brother    Breast cancer Neg Hx     DRUG ALLERGIES:   Allergies  Allergen Reactions   Fish Allergy Shortness Of Breath    All kinds of fish. Severe vomitting even with smell of fish.    Macrolides And Ketolides Other (See Comments)    Severe stomach pain (mycins)   Meperidine Shortness Of Breath, Nausea Only and Other (See Comments)    Stomach pain    Other Nausea Only, Swelling, Rash, Anaphylaxis, Diarrhea, Shortness Of Breath and Other (See Comments)    Allergen: NON-STEROIDS, bextra - hands and feet swell  Reaction:  Stomach pain   Other Reaction: Intolerance   Prednisone Anaphylaxis and Other (See Comments)    (facial swelling/redness/burning)Increases pts blood sugar  Pt states that she is allergic to all steroids.     Shellfish Allergy Anaphylaxis, Shortness Of Breath, Diarrhea, Nausea Only and Rash    Stomach pain    Sulfa Antibiotics Shortness Of Breath, Diarrhea, Nausea Only and Other (See Comments)    Reaction:  Stomach pain     Sulfacetamide Sodium Diarrhea, Nausea Only, Other (See Comments) and Shortness Of Breath    Reaction:  Stomach pain    Telbivudine  Other (See Comments)    Unknown reaction   Uloric [Febuxostat] Anaphylaxis    Locks pt's body up    Aspirin  Rash and Other (See Comments)    Stomach pain (tolerates lower doses)   Celecoxib Other (See Comments)    GI bleed, weakness, and stomach pain.     Cephalexin Diarrhea, Nausea Only and Other (See Comments)    stomach pain    Codeine Diarrhea, Nausea Only and Other (See Comments)    Reaction:  Stomach pain Pt tolerates morphine     Dilaudid  [Hydromorphone  Hcl] Other (See Comments)    Reactions: easy to overdose - blood pressure drops really low   Erythromycin Diarrhea, Nausea Only and Other (See Comments)    Reaction:  Stomach pain    Iodinated Contrast Media Rash and Other (See Comments)    Pt states that she is unable to have because she has chronic kidney disease.     Lipitor [Atorvastatin Calcium ] Nausea Only    Reaction: nausea, weakness, pass blood   Oxycodone  Other (See Comments)    Reaction:  Stomach pain    Aleve [Naproxen]     Reaction: severe stomach pain   Atorvastatin Nausea Only and Other (See Comments)    Weakness    Cephalosporins Diarrhea, Nausea Only and Other (See Comments)    Other: abdominal pain  Tolerated 1st generation cephalosporin (CEFAZOLIN ) on 06/02/2022 and 01/21/2023 without documented ADRs.    Doxycycline     Stomach pain    Iodine Rash   Motrin [Ibuprofen]     Reaction: severe stomach pain   Tape Other (See Comments)    Causes pts skin to tear  Pt states that she is able to use paper tape.       Valdecoxib Swelling and Other (See Comments)    Pt states that her hands and feet swell.      REVIEW OF SYSTEMS:   ROS As per history of present illness. All pertinent systems were reviewed above. Constitutional, HEENT, cardiovascular, respiratory, GI, GU, musculoskeletal, neuro, psychiatric, endocrine, integumentary and hematologic systems were reviewed and are otherwise negative/unremarkable except for positive findings mentioned above in  the HPI.   MEDICATIONS AT HOME:   Prior to Admission  medications   Medication Sig Start Date End Date Taking? Authorizing Provider  acetaminophen  (TYLENOL ) 500 MG tablet Take 2 tablets (1,000 mg total) by mouth every 8 (eight) hours. 10/17/23 10/16/24  Tobie Priest, MD  albuterol  (VENTOLIN  HFA) [phone] Base) MCG/ACT inhaler Inhale 2 puffs into the lungs every 6 (six) hours as needed for wheezing or shortness of breath (cough).    [provider]  allopurinol  (ZYLOPRIM ) 100 MG tablet Take 100 mg by mouth in the morning.    [provider]  alum & mag hydroxide-simeth (MAALOX/MYLANTA) 200-200-20 MG/5ML suspension Take 10 mLs by mouth 3 (three) times daily as needed for indigestion or heartburn.    [provider]  amiodarone  (PACERONE ) 200 MG tablet Take 0.5 tablets (100 mg total) by mouth daily. 07/11/23   Riddle, Suzann, NP  apixaban  (ELIQUIS ) 2.5 MG TABS tablet Take 1 tablet (2.5 mg total) by mouth 2 (two) times daily. 03/06/22   Alexander, Natalie, DO  Cholecalciferol  (VITAMIN D ) 50 MCG (2000 UT) CAPS Take 2,000 Units by mouth in the morning.    [provider]  CLARITIN  10 MG tablet Take 10 mg by mouth daily. 12/18/23   [provider]  docusate sodium  (COLACE) 100 MG capsule Take 100 mg by mouth 2 (two) times daily.    [provider]  Ergocalciferol  (VITAMIN D2) 50 MCG (2000 UT) TABS Take 1 tablet by mouth daily.    [provider]  famotidine  (PEPCID ) 10 MG tablet Take 2 tablets (20 mg total) by mouth 2 (two) times daily. 05/14/24 06/13/24  Viviann Pastor, MD  Fluticasone Furoate [facility] SENSIMIST NA) Place 1 spray into the nose in the morning.    [provider]  furosemide  (LASIX ) 20 MG tablet Take 1 tablet (20 mg total) by mouth daily. 02/02/23   Josette Ade, MD  gabapentin  (NEURONTIN ) 300 MG capsule Take 600 mg by mouth 3 (three) times daily. (0800, 1400 & 2000)    [provider]  ibandronate (BONIVA) 150  MG tablet Take 150 mg by mouth every 30 (thirty) days. Take in the morning with a full glass of water , on an empty stomach, and do not take anything else by mouth or lie down for the next 30 min.    [provider]  iron  polysaccharides (NIFEREX) 150 MG capsule Take 1 capsule (150 mg total) by mouth daily. 11/07/23   Laurita Pillion, MD  LANTUS  100 UNIT/ML injection Inject 0.19 mLs (19 Units total) into the skin at bedtime. 01/11/24   Alexander, Natalie, DO  levothyroxine  (SYNTHROID ) 75 MCG tablet Take 37.5 mcg by mouth daily before breakfast. (0600)    [provider]  magnesium  hydroxide (MILK OF MAGNESIA) 400 MG/5ML suspension Take 30 mLs by mouth daily as needed (constipation).    [provider]  Multiple Vitamin (MULTIVITAMIN WITH MINERALS) TABS tablet Take 1 tablet by mouth daily. 02/02/23   Josette Ade, MD  nystatin  powder Apply 1 Application topically in the morning and at bedtime. (0800 & 2000) Apply topically to groin    [provider]  ondansetron  (ZOFRAN -ODT) 4 MG disintegrating tablet Take 1 tablet (4 mg total) by mouth every 8 (eight) hours as needed for nausea or vomiting. 10/17/23   Tobie Priest, MD  pantoprazole  (PROTONIX ) 40 MG tablet Take 1 tablet (40 mg total) by mouth 2 (two) times daily before a meal. 01/11/24   Marsa Edelman, DO  polyethylene glycol (MIRALAX  / GLYCOLAX ) 17 g packet Take 17 g by mouth daily.  02/01/23   Josette Ade, MD  Polyvinyl Alcohol -Povidone PF 1.4-0.6 % SOLN Place 1 drop into both eyes in the morning and at bedtime.    [provider]  rOPINIRole  (REQUIP ) 0.5 MG tablet Take 0.5 mg by mouth at bedtime. (2000)    [provider]  senna (SENOKOT) 8.6 MG tablet Take 1 tablet (8.6 mg total) by mouth at bedtime as needed for constipation. 01/11/24   Alexander, Natalie, DO  sertraline  (ZOLOFT ) 100 MG tablet Take 100 mg by mouth in the morning. (0800) 100 mg + 25 mg=125 mg    [provider]   sertraline  (ZOLOFT ) 25 MG tablet Take 25 mg by mouth in the morning. (0800) 25 mg + 100 mg=125 mg    [provider]  solifenacin (VESICARE) 5 MG tablet Take 5 mg by mouth in the morning. (0800)    [provider]  sucralfate  (CARAFATE ) 1 g tablet Take 1 tablet (1 g total) by mouth 4 (four) times daily. 05/14/24   Viviann Pastor, MD  thiamine  (VITAMIN B-1) 100 MG tablet Take 1 tablet (100 mg total) by mouth daily. 02/02/23   Josette Ade, MD  traMADol  (ULTRAM ) 50 MG tablet Take 1 tablet (50 mg total) by mouth every 6 (six) hours as needed for moderate pain (pain score 4-6). 11/06/23   Laurita Pillion, MD      VITAL SIGNS:  Blood pressure (!) 127/56, pulse 67, temperature 98 F (36.7 C), resp. rate 13, SpO2 100%.  PHYSICAL EXAMINATION:  Physical Exam  GENERAL:  83 y.o.-year-old Caucasian female patient lying in the bed with no acute distress.  EYES: Pupils equal, round, reactive to light and accommodation. No scleral icterus. Extraocular muscles intact.  HEENT: Head atraumatic, normocephalic. Oropharynx and nasopharynx clear.  NECK:  Supple, no jugular venous distention. No thyroid  enlargement, no tenderness.  LUNGS: Normal breath sounds bilaterally, no wheezing, rales,rhonchi or crepitation. No use of accessory muscles of respiration.  CARDIOVASCULAR: Regular rate and rhythm, S1, S2 normal. No murmurs, rubs, or gallops.  ABDOMEN: Soft, nondistended, with epigastric and right upper quadrant tenderness without rebound tenderness guarding or rigidity.  Bowel sounds present. No organomegaly or mass.  EXTREMITIES: No pedal edema, cyanosis, or clubbing.  NEUROLOGIC: Cranial nerves II through XII are intact. Muscle strength 5/5 in all extremities. Sensation intact. Gait not checked.  PSYCHIATRIC: The patient is alert and oriented x 3.  Normal affect and good eye contact. SKIN: No obvious rash, lesion, or ulcer.   LABORATORY PANEL:   CBC Recent Labs  Lab 05/25/24 1734   WBC 7.2  HGB 14.1  HCT 44.0  PLT 98*   ------------------------------------------------------------------------------------------------------------------  Chemistries  Recent Labs  Lab 05/25/24 1734  NA 137  K 4.8  CL 103  CO2 21*  GLUCOSE 337*  BUN 57*  CREATININE 1.63*  CALCIUM  8.8*  MG 1.9  AST 106*  ALT 73*  ALKPHOS 134*  BILITOT 1.8*   ------------------------------------------------------------------------------------------------------------------  Cardiac Enzymes No results for input(s): TROPONINI in the last 168 hours. ------------------------------------------------------------------------------------------------------------------  RADIOLOGY:  US  ABDOMEN LIMITED RUQ (LIVER/GB) Result Date: 05/25/2024 CLINICAL DATA:  Nausea and von vomiting, right upper quadrant pain, transaminitis EXAM: ULTRASOUND ABDOMEN LIMITED RIGHT UPPER QUADRANT COMPARISON:  05/25/2024 FINDINGS: Gallbladder: Surgically absent. Common bile duct: Diameter: 10 mm Liver: No focal lesion identified. Within normal limits in parenchymal echogenicity. Portal vein is patent on color Doppler imaging with normal direction of blood flow towards the liver. Other: None. IMPRESSION: 1. Unremarkable right upper quadrant ultrasound in a  patient status post cholecystectomy. Electronically Signed   By: Ozell Daring M.D.   On: 05/25/2024 21:31   CT ABDOMEN PELVIS WO CONTRAST Result Date: 05/25/2024 EXAM: CT ABDOMEN AND PELVIS WITHOUT CONTRAST 05/25/2024 06:55:38 PM TECHNIQUE: CT of the abdomen and pelvis was performed without the administration of intravenous contrast. Multiplanar reformatted images are provided for review. Automated exposure control, iterative reconstruction, and/or weight-based adjustment of the mA/kV was utilized to reduce the radiation dose to as low as reasonably achievable. COMPARISON: None available. CLINICAL HISTORY: Eval appy. RLQ TTP. N/V, recently diagnosed hiatal hernia. RLQ pain.  FINDINGS: LOWER CHEST: Patient leads noted within the right heart. Cardiac size within normal limits. Visualized lung bases are clear. LIVER: Status post cholecystectomy. GALLBLADDER AND BILE DUCTS: Status post cholecystectomy. SPLEEN: No acute abnormality. PANCREAS: No acute abnormality. ADRENAL GLANDS: No acute abnormality. KIDNEYS, URETERS AND BLADDER: No stones in the kidneys or ureters. No hydronephrosis. No perinephric or periureteral stranding. Urinary bladder is unremarkable. GI AND BOWEL: Status post distal colectomy with colorectal anastomotic staple line noted. Appendix is not clearly identified, however, there are no secondary signs of appendicitis within the right lower quadrant. Moderate colon stool burden. Stomach, small bowel, and large bowel are otherwise unremarkable. No free intraperitoneal gas or fluid. PERITONEUM AND RETROPERITONEUM: No free intraperitoneal gas or fluid. VASCULATURE: Moderate aortoiliac atherosclerotic calcification. LYMPH NODES: Surgical clips within the pelvis may relate to prior pelvic lymph node dissection. REPRODUCTIVE ORGANS: Status post hysterectomy. No adnexal masses are seen. BONES AND SOFT TISSUES: Status post right hip ORIF. Status post L4-S1 lumbar fusion with instrumentation. Remote severe anterior wedge compression deformities of T12 and L1 are identified, status post vertebroplasty with residual accentuated thoracolumbar kyphosis. Mild retropulsion of the posterosuperior aspect of the T12 vertebral body results in moderate central canal stenosis. No acute bone abnormality. No lytic or blastic bone lesion. IMPRESSION: 1. No CT evidence of appendicitis. Appendix not clearly identified. 2. Moderate colon stool burden. Electronically signed by: Dorethia Molt MD 05/25/2024 07:23 PM EDT RP Workstation: [id number]   DG Chest Portable 1 View Result Date: 05/25/2024 CLINICAL DATA:  SOB, hypoxic. hx PPM, ILD, dCHF EXAM: PORTABLE CHEST 1 VIEW COMPARISON:  Chest x-ray  10/28/2023, CT chest 05/14/2024 FINDINGS: Left chest wall dual lead pacemaker. \ The heart and mediastinal contours are unchanged. Atherosclerotic plaque. Prominent hilar vasculature. No focal consolidation. No pulmonary edema. No pleural effusion. No pneumothorax. No acute osseous abnormality. Partially visualized intramedullary nail fixation of the left humerus. IMPRESSION: 1. Pulmonary venous congestion.  No overt edema. 2.  Aortic Atherosclerosis (ICD10-I70.0). Electronically Signed   By: Morgane  Naveau M.D.   On: 05/25/2024 17:53      IMPRESSION AND PLAN:  Assessment and Plan: * Intractable nausea and vomiting - The patient will be admitted to a medical telemetry bed. - She was placed on clear liquids. - Will follow LFTs with hydration. - Will continue PPI therapy and Carafate . - GI consult to be obtained. - I notified Dr. Therisa about the patient - Pain management will be provided.  Elevated LFTs - Will follow LFTs with hydration. - Will check viral hepatitis markers.  Paroxysmal atrial fibrillation (HCC) - Will continue Eliquis .  Hypothyroidism - Will continue Synthroid .  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 - Will continue basal coverage.  Depression -Will continue Zoloft .   DVT prophylaxis: Lovenox .  Advanced Care Planning:  Code Status: The patient is DNR and DNI. Family Communication:  The plan of care was discussed in details with the patient (and family). I answered all questions. The patient agreed to proceed with the above mentioned plan. Further management will depend upon hospital course. Disposition Plan: Back to previous home environment Consults called: GI All the records are reviewed and case discussed with ED provider.  Status is: Inpatient  At the time of the admission, it appears that the appropriate admission status for this patient is inpatient.  This is judged to  be reasonable and necessary in order to provide the required intensity of service to ensure the patient's safety given the presenting symptoms, physical exam findings and initial radiographic and laboratory data in the context of comorbid conditions.  The patient requires inpatient status due to high intensity of service, high risk of further deterioration and high frequency of surveillance required.  I certify that at the time of admission, it is my clinical judgment that the patient will require inpatient hospital care extending more than 2 midnights.                            Dispo: The patient is from: SNF.              Anticipated d/c is to: SNF.              Patient currently is not medically stable to d/c.              Difficult to place patient: No  Madison DELENA Peaches M.D on 05/25/2024 at 11:13 PM  [facility]   From 7 PM-7 AM, contact night-coverage [email or web address]  CC: Primary care physician; Cordella Corning, FNP

## 2024-05-25 NOTE — Assessment & Plan Note (Signed)
-   The patient will be placed on supplemental coverage with NovoLog. - Will continue basal coverage.

## 2024-05-25 NOTE — Assessment & Plan Note (Addendum)
-   The patient will be admitted to a medical telemetry bed. - She was placed on clear liquids. - Will follow LFTs with hydration. - Will continue PPI therapy and Carafate . - GI consult to be obtained. - I notified Dr. Therisa about the patient - Pain management will be provided.

## 2024-05-25 NOTE — ED Provider Notes (Signed)
 [facility] Provider Note    Event Date/Time   First MD Initiated Contact with Patient 05/25/24 1718     (approximate)   History   Shortness of Breath   HPI  Deborah Obrien is a 83 y.o. female who presents to the ED for evaluation of Shortness of Breath   Review outpatient endocrine evaluation from 2 days ago, and cardiology visit from 1 year ago.  History of DM, A-fib on Eliquis , and amiodarone  hiatal hernia and GERD, PPM for sinus node dysfunction, diastolic CHF, CKD 4 I review ED visit from 11 days ago.  Recently diagnosed hiatal hernia  Patient presents from local SNF for evaluation of recurrent emesis and epigastric pain.  She is notably triage for shortness of breath but she reports her breathing feels fine her issue is epigastric pain and recurrent emesis over the past 12 hours.  Reports awakening early this morning with the symptoms and she has been vomiting intermittently throughout the day despite her SNF giving her Zofran .  Reports it is just like last time I was here referring to her visit 11 days ago.  Physical Exam   Triage Vital Signs: ED Triage Vitals [05/25/24 1715]  Encounter Vitals Group     BP      Girls Systolic BP Percentile      Girls Diastolic BP Percentile      Boys Systolic BP Percentile      Boys Diastolic BP Percentile      Pulse      Resp      Temp      Temp src      SpO2      Weight      Height      Head Circumference      Peak Flow      Pain Score 8     Pain Loc      Pain Education      Exclude from Growth Chart     Most recent vital signs: Vitals:   05/25/24 1733 05/25/24 1900  BP:  (!) 122/45  Pulse:  (!) 59  Resp:  15  Temp: 99.4 F (37.4 C)   SpO2:  97%    General: Awake, no distress.  CV:  Good peripheral perfusion.  Resp:  Normal effort.  Abd:  No distention.  RLQ and RUQ tenderness is present, mild epigastric tenderness MSK:  No deformity noted.  Neuro:  No focal deficits  appreciated. Other:     ED Results / Procedures / Treatments   Labs (all labs ordered are listed, but only abnormal results are displayed) Labs Reviewed  COMPREHENSIVE METABOLIC PANEL WITH GFR - Abnormal; Notable for the following components:      Result Value   CO2 21 (*)    Glucose, Bld 337 (*)    BUN 57 (*)    Creatinine, Ser 1.63 (*)    Calcium  8.8 (*)    AST 106 (*)    ALT 73 (*)    Alkaline Phosphatase 134 (*)    Total Bilirubin 1.8 (*)    GFR, Estimated 31 (*)    All other components within normal limits  CBC WITH DIFFERENTIAL/PLATELET - Abnormal; Notable for the following components:   MCV 101.6 (*)    Platelets 98 (*)    Lymphs Abs 0.6 (*)    All other components within normal limits  BRAIN NATRIURETIC PEPTIDE - Abnormal; Notable for the following components:   B Natriuretic Peptide  262.5 (*)    All other components within normal limits  RESP PANEL BY RT-PCR (RSV, FLU A&B, COVID)  RVPGX2  MAGNESIUM   LIPASE, BLOOD  TROPONIN I (HIGH SENSITIVITY)  TROPONIN I (HIGH SENSITIVITY)    EKG Low amplitude, suspect atrial pacing with P waves, rate of 64 bpm without clear acute ischemic features or high-grade apical block.  RADIOLOGY 1 view CXR interpreted by me with poor inspiration mild pulmonary vascular congestion CT abdomen/pelvis interpreted by me without clear signs of acute pathology  Official radiology report(s): CT ABDOMEN PELVIS WO CONTRAST Result Date: 05/25/2024 EXAM: CT ABDOMEN AND PELVIS WITHOUT CONTRAST 05/25/2024 06:55:38 PM TECHNIQUE: CT of the abdomen and pelvis was performed without the administration of intravenous contrast. Multiplanar reformatted images are provided for review. Automated exposure control, iterative reconstruction, and/or weight-based adjustment of the mA/kV was utilized to reduce the radiation dose to as low as reasonably achievable. COMPARISON: None available. CLINICAL HISTORY: Eval appy. RLQ TTP. N/V, recently diagnosed hiatal  hernia. RLQ pain. FINDINGS: LOWER CHEST: Patient leads noted within the right heart. Cardiac size within normal limits. Visualized lung bases are clear. LIVER: Status post cholecystectomy. GALLBLADDER AND BILE DUCTS: Status post cholecystectomy. SPLEEN: No acute abnormality. PANCREAS: No acute abnormality. ADRENAL GLANDS: No acute abnormality. KIDNEYS, URETERS AND BLADDER: No stones in the kidneys or ureters. No hydronephrosis. No perinephric or periureteral stranding. Urinary bladder is unremarkable. GI AND BOWEL: Status post distal colectomy with colorectal anastomotic staple line noted. Appendix is not clearly identified, however, there are no secondary signs of appendicitis within the right lower quadrant. Moderate colon stool burden. Stomach, small bowel, and large bowel are otherwise unremarkable. No free intraperitoneal gas or fluid. PERITONEUM AND RETROPERITONEUM: No free intraperitoneal gas or fluid. VASCULATURE: Moderate aortoiliac atherosclerotic calcification. LYMPH NODES: Surgical clips within the pelvis may relate to prior pelvic lymph node dissection. REPRODUCTIVE ORGANS: Status post hysterectomy. No adnexal masses are seen. BONES AND SOFT TISSUES: Status post right hip ORIF. Status post L4-S1 lumbar fusion with instrumentation. Remote severe anterior wedge compression deformities of T12 and L1 are identified, status post vertebroplasty with residual accentuated thoracolumbar kyphosis. Mild retropulsion of the posterosuperior aspect of the T12 vertebral body results in moderate central canal stenosis. No acute bone abnormality. No lytic or blastic bone lesion. IMPRESSION: 1. No CT evidence of appendicitis. Appendix not clearly identified. 2. Moderate colon stool burden. Electronically signed by: Dorethia Molt MD 05/25/2024 07:23 PM EDT RP Workstation: [id number]   DG Chest Portable 1 View Result Date: 05/25/2024 CLINICAL DATA:  SOB, hypoxic. hx PPM, ILD, dCHF EXAM: PORTABLE CHEST 1 VIEW COMPARISON:   Chest x-ray 10/28/2023, CT chest 05/14/2024 FINDINGS: Left chest wall dual lead pacemaker. \ The heart and mediastinal contours are unchanged. Atherosclerotic plaque. Prominent hilar vasculature. No focal consolidation. No pulmonary edema. No pleural effusion. No pneumothorax. No acute osseous abnormality. Partially visualized intramedullary nail fixation of the left humerus. IMPRESSION: 1. Pulmonary venous congestion.  No overt edema. 2.  Aortic Atherosclerosis (ICD10-I70.0). Electronically Signed   By: Morgane  Naveau M.D.   On: 05/25/2024 17:53    PROCEDURES and INTERVENTIONS:  .1-3 Lead EKG Interpretation  Performed by: Claudene Rover, MD Authorized by: Claudene Rover, MD     Interpretation: normal     ECG rate:  60   ECG rate assessment: normal     Rhythm: sinus rhythm     Ectopy: none     Conduction: normal     Medications  famotidine  (PEPCID ) IVPB 20 mg premix (0 mg  Intravenous Stopped 05/25/24 1903)  lactated ringers  bolus 500 mL (0 mLs Intravenous Stopped 05/25/24 1913)  ondansetron  (ZOFRAN ) injection 4 mg (4 mg Intravenous Given 05/25/24 1833)  morphine  (PF) 2 MG/ML injection 2 mg (2 mg Intravenous Given 05/25/24 1833)  alum & mag hydroxide-simeth (MAALOX/MYLANTA) 200-200-20 MG/5ML suspension 30 mL (30 mLs Oral Given 05/25/24 1958)  lidocaine  (XYLOCAINE ) 2 % viscous mouth solution 15 mL (15 mLs Mouth/Throat Given 05/25/24 1958)  HYDROmorphone  (DILAUDID ) injection 0.5 mg (0.5 mg Intravenous Given 05/25/24 2037)     IMPRESSION / MDM / ASSESSMENT AND PLAN / ED COURSE  I reviewed the triage vital signs and the nursing notes.  Differential diagnosis includes, but is not limited to, ACS, PTX, PNA, muscle strain/spasm, PE, dissection, anxiety, pleural effusion\, GERD gastritis, acute appendicitis, choledocholithiasis  {Patient presents with symptoms of an acute illness or injury that is potentially life-threatening.  Patient presents with nausea, vomiting and right-sided abdominal pain  with transaminitis requiring medical admission.  She is s/p cholecystectomy but some concerns for choledocholithiasis considering her laboratory derangements, area of pain.  No improvement of her symptoms with management of gastritis/hiatal hernia.  BNP is only mildly elevated and she appears volume depleted with hyperglycemia and AKI without acidosis.  CT abdomen/pelvis is unrevealing.  Initially ordered MRCP but we cannot accommodate her over the weekend for MRI as she has a pacemaker and this must be done during the week.  She continues to be quite uncomfortable.  Will add on RUQ ultrasound and consult with medicine for admission  Clinical Course as of 05/25/24 2051  Sat May 25, 2024  2032 Reassessed, patient quite uncomfortable and looks miserable.  Reexamined and has RUQ tenderness.  We discussed mild transaminitis and the possibility of choledocholithiasis despite her history of cholecystectomy.  I recommended MRI and she is agreeable. [DS]  2047 Apparently we cannot perform MRI pacemaker patients over the weekend so they will not be able to perform the study until Monday. [DS]    Clinical Course User Index [DS] Claudene Rover, MD     FINAL CLINICAL IMPRESSION(S) / ED DIAGNOSES   Final diagnoses:  Transaminitis  Nausea and vomiting, unspecified vomiting type     Rx / DC Orders   ED Discharge Orders     None        Note:  This document was prepared using Dragon voice recognition software and may include unintentional dictation errors.   Claudene Rover, MD 05/25/24 [phone]

## 2024-05-25 NOTE — ED Notes (Signed)
 Pt transported to CT ?

## 2024-05-25 NOTE — Assessment & Plan Note (Signed)
-   Will continue Synthroid .

## 2024-05-25 NOTE — Assessment & Plan Note (Signed)
-   Will follow LFTs with hydration. - Will check viral hepatitis markers.

## 2024-05-26 ENCOUNTER — Encounter: Payer: Self-pay | Admitting: Family Medicine

## 2024-05-26 DIAGNOSIS — R112 Nausea with vomiting, unspecified: Secondary | ICD-10-CM | POA: Diagnosis not present

## 2024-05-26 DIAGNOSIS — N1832 Chronic kidney disease, stage 3b: Secondary | ICD-10-CM | POA: Insufficient documentation

## 2024-05-26 LAB — CBC
HCT: 32.7 % — ABNORMAL LOW (ref 36.0–46.0)
Hemoglobin: 10.7 g/dL — ABNORMAL LOW (ref 12.0–15.0)
MCH: 32.7 pg (ref 26.0–34.0)
MCHC: 32.7 g/dL (ref 30.0–36.0)
MCV: 100 fL (ref 80.0–100.0)
Platelets: 115 K/uL — ABNORMAL LOW (ref 150–400)
RBC: 3.27 MIL/uL — ABNORMAL LOW (ref 3.87–5.11)
RDW: 12.6 % (ref 11.5–15.5)
WBC: 3.7 K/uL — ABNORMAL LOW (ref 4.0–10.5)
nRBC: 0 % (ref 0.0–0.2)

## 2024-05-26 LAB — HEPATITIS PANEL, ACUTE
HCV Ab: NONREACTIVE
Hep A IgM: NONREACTIVE
Hep B C IgM: NONREACTIVE
Hepatitis B Surface Ag: NONREACTIVE

## 2024-05-26 LAB — GLUCOSE, CAPILLARY
Glucose-Capillary: 229 mg/dL — ABNORMAL HIGH (ref 70–99)
Glucose-Capillary: 207 mg/dL — ABNORMAL HIGH (ref 70–99)
Glucose-Capillary: 92 mg/dL (ref 70–99)

## 2024-05-26 LAB — COMPREHENSIVE METABOLIC PANEL WITH GFR
ALT: 64 U/L — ABNORMAL HIGH (ref 0–44)
AST: 87 U/L — ABNORMAL HIGH (ref 15–41)
Albumin: 3 g/dL — ABNORMAL LOW (ref 3.5–5.0)
Alkaline Phosphatase: 113 U/L (ref 38–126)
Anion gap: 8 (ref 5–15)
BUN: 46 mg/dL — ABNORMAL HIGH (ref 8–23)
CO2: 23 mmol/L (ref 22–32)
Calcium: 8 mg/dL — ABNORMAL LOW (ref 8.9–10.3)
Chloride: 107 mmol/L (ref 98–111)
Creatinine, Ser: 1.37 mg/dL — ABNORMAL HIGH (ref 0.44–1.00)
GFR, Estimated: 38 mL/min — ABNORMAL LOW (ref >60)
Glucose, Bld: 253 mg/dL — ABNORMAL HIGH (ref 70–99)
Potassium: 4.3 mmol/L (ref 3.5–5.1)
Sodium: 138 mmol/L (ref 135–145)
Total Bilirubin: 1.2 mg/dL (ref 0.0–1.2)
Total Protein: 5.9 g/dL — ABNORMAL LOW (ref 6.5–8.1)

## 2024-05-26 LAB — HEMOGLOBIN A1C
Hgb A1c MFr Bld: 8 % — ABNORMAL HIGH (ref 4.8–5.6)
Mean Plasma Glucose: 182.9 mg/dL

## 2024-05-26 MED ORDER — PANTOPRAZOLE SODIUM 40 MG IV SOLR
40.0000 mg | Freq: Two times a day (BID) | INTRAVENOUS | Status: DC
  Administered 2024-05-26 – 2024-05-31 (×11): 40 mg via INTRAVENOUS
  Filled 2024-05-26 (×11): qty 10

## 2024-05-26 MED ORDER — ENOXAPARIN SODIUM 30 MG/0.3ML IJ SOSY
30.0000 mg | PREFILLED_SYRINGE | INTRAMUSCULAR | Status: DC
  Administered 2024-05-27: 30 mg via SUBCUTANEOUS
  Filled 2024-05-26: qty 0.3

## 2024-05-26 MED ORDER — INSULIN ASPART 100 UNIT/ML IJ SOLN
0.0000 [IU] | Freq: Three times a day (TID) | INTRAMUSCULAR | Status: DC
  Administered 2024-05-26: 5 [IU] via SUBCUTANEOUS
  Administered 2024-05-27: 2 [IU] via SUBCUTANEOUS
  Filled 2024-05-26 (×2): qty 1

## 2024-05-26 MED ORDER — INSULIN ASPART 100 UNIT/ML IJ SOLN
0.0000 [IU] | Freq: Every day | INTRAMUSCULAR | Status: DC
  Administered 2024-05-29: 3 [IU] via SUBCUTANEOUS
  Filled 2024-05-26: qty 1

## 2024-05-26 MED ORDER — TRAMADOL HCL 50 MG PO TABS
50.0000 mg | ORAL_TABLET | Freq: Four times a day (QID) | ORAL | Status: DC | PRN
  Administered 2024-05-27 – 2024-05-30 (×4): 50 mg via ORAL
  Filled 2024-05-26 (×4): qty 1

## 2024-05-26 NOTE — Plan of Care (Signed)

## 2024-05-26 NOTE — Progress Notes (Signed)
 PROGRESS NOTE    Deborah Obrien  FMW:[id number] DOB: 1941-07-28 DOA: 05/25/2024 PCP: Cordella Corning, FNP     Brief Narrative:   From admission h and p  Deborah Obrien is a 83 y.o. Caucasian homeless female with medical history significant for anxiety, carotid artery stenosis, chronic heart failure with preserved EF, coronary artery disease, hypertension, and hypothyroidism, who presented to the emergency room with acute onset of dyspnea initially as well as recurrent nausea and vomiting with epigastric abdominal pain over the last 12 hours.  She was not dyspneic during my interview.  Despite given Zofran  at her SNF she continued to have recurrent vomiting.  No fever or chills.  No chest pain or palpitations.  No cough or wheezing or dyspnea.  No dysuria, oliguria or hematuria or flank pain.     Assessment & Plan:   Principal Problem:   Intractable nausea and vomiting Active Problems:   Elevated LFTs   Hypothyroidism   Paroxysmal atrial fibrillation (HCC)   Type 2 diabetes mellitus without complications (HCC)   CAD (coronary artery disease)   Gout   Cardiac pacemaker in situ   Depression   CKD stage 3b, GFR 30-44 ml/min (HCC)  # Epigastric abdominal pain # Nausea/vomiting One day acute onset of these symptoms. Nbnb. Symptoms improving. CT abdomen unremarkable, though hiatal hernia seen on CT a week ago. No sig pathology seen on EGD earlier this year. Suspect this hernia is to blame. Unfortunately, admitting provider continued home apixaban , which needs to be held for 48 hours prior to egd. No report of bleeding. 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 Fluids staying down  Objective: Vitals:   05/25/24 2342 05/25/24 2353 05/26/24 0334 05/26/24 0820  BP: (!) 127/56 (!) 107/42 (!) 108/46 (!) 114/50  Pulse: 67 61 63 61  Resp: 13   18  Temp: 98 F (36.7 C) 98.2 F (36.8 C) 98.2 F (36.8 C) 98.6 F (37 C)  TempSrc: Oral   Oral  SpO2:  97% 99% 99%  Weight: 70.3 kg     Height: 5' 3 (1.6 m)       Intake/Output Summary (Last 24 hours) at 05/26/2024 1142 Last data filed at 05/26/2024 1100 Gross per 24 hour  Intake 1295.08 ml  Output --  Net 1295.08 ml   Filed Weights   05/25/24 2342  Weight: 70.3 kg    Examination:  General exam: Appears calm and comfortable  Respiratory system: Clear to auscultation. Respiratory effort normal.  Cardiovascular system: S1 & S2 heard, RRR.   Gastrointestinal system: Abdomen is nondistended, soft and nontender.   Central nervous system: Alert and oriented. No focal neurological deficits. Extremities: Symmetric 5 x 5 power. Skin: No rashes, lesions or ulcers Psychiatry: Judgement and insight appear normal. Mood & affect appropriate.     Data Reviewed: I have personally reviewed following labs and imaging studies  CBC: Recent Labs  Lab 05/25/24 1734 05/26/24 0419  WBC 7.2 3.7*  NEUTROABS 6.2  --   HGB 14.1 10.7*  HCT  44.0 32.7*  MCV 101.6* 100.0  PLT 98* 115*   Basic Metabolic Panel: Recent Labs  Lab 05/25/24 1734 05/26/24 0419  NA 137 138  K 4.8 4.3  CL 103 107  CO2 21* 23  GLUCOSE 337* 253*  BUN 57* 46*  CREATININE 1.63* 1.37*  CALCIUM  8.8* 8.0*  MG 1.9  --    GFR: Estimated Creatinine Clearance: 29.3 mL/min (A) (by C-G formula based on SCr of 1.37 mg/dL (H)). Liver Function Tests: Recent Labs  Lab 05/25/24 1734 05/26/24 0419  AST 106* 87*  ALT 73* 64*  ALKPHOS 134* 113  BILITOT 1.8* 1.2  PROT 7.8 5.9*  ALBUMIN  4.0 3.0*   Recent Labs  Lab 05/25/24 1734  LIPASE 30   No results for input(s): AMMONIA in the last 168 hours. Coagulation Profile: No results for input(s): INR, PROTIME in the last 168 hours. Cardiac Enzymes: No results for input(s): CKTOTAL, CKMB, CKMBINDEX, TROPONINI in the last 168 hours. BNP (last 3 results) No results for input(s): PROBNP in the last 8760 hours. HbA1C: No results for input(s): HGBA1C in the last 72 hours. CBG: Recent Labs  Lab 05/25/24 2316  GLUCAP 265*   Lipid Profile: No results for input(s): CHOL, HDL, LDLCALC, TRIG, CHOLHDL, LDLDIRECT in the last 72 hours. Thyroid  Function Tests: No results for input(s): TSH, T4TOTAL, FREET4, T3FREE, THYROIDAB in the last 72 hours. Anemia Panel: No results for input(s): VITAMINB12, FOLATE, FERRITIN, TIBC, IRON , RETICCTPCT in the last 72 hours. Urine analysis:    Component Value Date/Time   COLORURINE YELLOW (A) 05/14/2024 1056   APPEARANCEUR CLEAR (A) 05/14/2024 1056   APPEARANCEUR Hazy 10/29/2012 1534   LABSPEC 1.009 05/14/2024 1056   LABSPEC 1.010 10/29/2012 1534   PHURINE 5.0 05/14/2024 1056   GLUCOSEU NEGATIVE 05/14/2024 1056   GLUCOSEU Negative 10/29/2012 1534   HGBUR NEGATIVE 05/14/2024 1056   BILIRUBINUR NEGATIVE 05/14/2024 1056   BILIRUBINUR Negative 10/29/2012 1534   KETONESUR NEGATIVE 05/14/2024 1056   PROTEINUR NEGATIVE  05/14/2024 1056   NITRITE NEGATIVE 05/14/2024 1056   LEUKOCYTESUR NEGATIVE 05/14/2024 1056   LEUKOCYTESUR Negative 10/29/2012 1534   Sepsis Labs: @LABRCNTIP (procalcitonin:4,lacticidven:4)  ) Recent Results (from the past 240 hours)  Resp panel by RT-PCR (RSV, Flu A&B, Covid) Anterior Nasal Swab     Status: None   Collection Time: 05/25/24  5:35 PM   Specimen: Anterior Nasal Swab  Result Value Ref Range Status   SARS Coronavirus 2 by RT PCR NEGATIVE NEGATIVE Final    Comment: (NOTE) SARS-CoV-2 target nucleic acids are NOT DETECTED.  The SARS-CoV-2 RNA is generally detectable in upper respiratory specimens during the acute phase of infection. The lowest concentration of SARS-CoV-2 viral copies this assay can detect is 138 copies/mL. A negative result does not preclude SARS-Cov-2 infection and should not be used as the sole basis for treatment or other patient management decisions. A negative result may occur with  improper specimen collection/handling, submission of specimen other than nasopharyngeal  swab, presence of viral mutation(s) within the areas targeted by this assay, and inadequate number of viral copies(<138 copies/mL). A negative result must be combined with clinical observations, patient history, and epidemiological information. The expected result is Negative.  Fact Sheet for Patients:  BloggerCourse.com  Fact Sheet for Healthcare Providers:  SeriousBroker.it  This test is no t yet approved or cleared by the [place]  FDA and  has been authorized for detection and/or diagnosis of SARS-CoV-2 by FDA under an Emergency Use Authorization (EUA). This EUA will remain  in effect (meaning this test can be used) for the duration of the COVID-19 declaration under Section 564(b)(1) of the Act, 21 U.S.C.section 360bbb-3(b)(1), unless the authorization is terminated  or revoked sooner.       Influenza A by PCR NEGATIVE  NEGATIVE Final   Influenza B by PCR NEGATIVE NEGATIVE Final    Comment: (NOTE) The Xpert Xpress SARS-CoV-2/FLU/RSV plus assay is intended as an aid in the diagnosis of influenza from Nasopharyngeal swab specimens and should not be used as a sole basis for treatment. Nasal washings and aspirates are unacceptable for Xpert Xpress SARS-CoV-2/FLU/RSV testing.  Fact Sheet for Patients: BloggerCourse.com  Fact Sheet for Healthcare Providers: SeriousBroker.it  This test is not yet approved or cleared by the [place]  FDA and has been authorized for detection and/or diagnosis of SARS-CoV-2 by FDA under an Emergency Use Authorization (EUA). This EUA will remain in effect (meaning this test can be used) for the duration of the COVID-19 declaration under Section 564(b)(1) of the Act, 21 U.S.C. section 360bbb-3(b)(1), unless the authorization is terminated or revoked.     Resp Syncytial Virus by PCR NEGATIVE NEGATIVE Final    Comment: (NOTE) Fact Sheet for Patients: BloggerCourse.com  Fact Sheet for Healthcare Providers: SeriousBroker.it  This test is not yet approved or cleared by the [place]  FDA and has been authorized for detection and/or diagnosis of SARS-CoV-2 by FDA under an Emergency Use Authorization (EUA). This EUA will remain in effect (meaning this test can be used) for the duration of the COVID-19 declaration under Section 564(b)(1) of the Act, 21 U.S.C. section 360bbb-3(b)(1), unless the authorization is terminated or revoked.  Performed at [facility] [address] [place], [place] [address]          Radiology Studies: US  ABDOMEN LIMITED RUQ (LIVER/GB) Result Date: 05/25/2024 CLINICAL DATA:  Nausea and von vomiting, right upper quadrant pain, transaminitis EXAM: ULTRASOUND ABDOMEN LIMITED RIGHT UPPER QUADRANT COMPARISON:  05/25/2024 FINDINGS:  Gallbladder: Surgically absent. Common bile duct: Diameter: 10 mm Liver: No focal lesion identified. Within normal limits in parenchymal echogenicity. Portal vein is patent on color Doppler imaging with normal direction of blood flow towards the liver. Other: None. IMPRESSION: 1. Unremarkable right upper quadrant ultrasound in a patient status post cholecystectomy. Electronically Signed   By: Ozell Daring M.D.   On: 05/25/2024 21:31   CT ABDOMEN PELVIS WO CONTRAST Result Date: 05/25/2024 EXAM: CT ABDOMEN AND PELVIS WITHOUT CONTRAST 05/25/2024 06:55:38 PM TECHNIQUE: CT of the abdomen and pelvis was performed without the administration of intravenous contrast. Multiplanar reformatted images are provided for review. Automated exposure control, iterative reconstruction, and/or weight-based adjustment of the mA/kV was utilized to reduce the radiation dose to as low as reasonably achievable. COMPARISON: None available. CLINICAL HISTORY: Eval appy. RLQ TTP. N/V, recently diagnosed hiatal hernia. RLQ pain. FINDINGS: LOWER CHEST: Patient leads noted within the right heart. Cardiac size within normal limits. Visualized lung bases are clear. LIVER: Status post  cholecystectomy. GALLBLADDER AND BILE DUCTS: Status post cholecystectomy. SPLEEN: No acute abnormality. PANCREAS: No acute abnormality. ADRENAL GLANDS: No acute abnormality. KIDNEYS, URETERS AND BLADDER: No stones in the kidneys or ureters. No hydronephrosis. No perinephric or periureteral stranding. Urinary bladder is unremarkable. GI AND BOWEL: Status post distal colectomy with colorectal anastomotic staple line noted. Appendix is not clearly identified, however, there are no secondary signs of appendicitis within the right lower quadrant. Moderate colon stool burden. Stomach, small bowel, and large bowel are otherwise unremarkable. No free intraperitoneal gas or fluid. PERITONEUM AND RETROPERITONEUM: No free intraperitoneal gas or fluid. VASCULATURE: Moderate  aortoiliac atherosclerotic calcification. LYMPH NODES: Surgical clips within the pelvis may relate to prior pelvic lymph node dissection. REPRODUCTIVE ORGANS: Status post hysterectomy. No adnexal masses are seen. BONES AND SOFT TISSUES: Status post right hip ORIF. Status post L4-S1 lumbar fusion with instrumentation. Remote severe anterior wedge compression deformities of T12 and L1 are identified, status post vertebroplasty with residual accentuated thoracolumbar kyphosis. Mild retropulsion of the posterosuperior aspect of the T12 vertebral body results in moderate central canal stenosis. No acute bone abnormality. No lytic or blastic bone lesion. IMPRESSION: 1. No CT evidence of appendicitis. Appendix not clearly identified. 2. Moderate colon stool burden. Electronically signed by: Dorethia Molt MD 05/25/2024 07:23 PM EDT RP Workstation: [id number]   DG Chest Portable 1 View Result Date: 05/25/2024 CLINICAL DATA:  SOB, hypoxic. hx PPM, ILD, dCHF EXAM: PORTABLE CHEST 1 VIEW COMPARISON:  Chest x-ray 10/28/2023, CT chest 05/14/2024 FINDINGS: Left chest wall dual lead pacemaker. \ The heart and mediastinal contours are unchanged. Atherosclerotic plaque. Prominent hilar vasculature. No focal consolidation. No pulmonary edema. No pleural effusion. No pneumothorax. No acute osseous abnormality. Partially visualized intramedullary nail fixation of the left humerus. IMPRESSION: 1. Pulmonary venous congestion.  No overt edema. 2.  Aortic Atherosclerosis (ICD10-I70.0). Electronically Signed   By: Morgane  Naveau M.D.   On: 05/25/2024 17:53        Scheduled Meds:  allopurinol   100 mg Oral q AM   amiodarone   100 mg Oral Daily   cholecalciferol   2,000 Units Oral q AM   famotidine   20 mg Oral BID   fesoterodine   4 mg Oral Daily   furosemide   20 mg Oral Daily   gabapentin   600 mg Oral TID   insulin  glargine  19 Units Subcutaneous QHS   iron  polysaccharides  150 mg Oral Daily   levothyroxine   37.5 mcg Oral  Q0600   loratadine   10 mg Oral Daily   multivitamin with minerals  1 tablet Oral Daily   pantoprazole   40 mg Oral BID AC   polyethylene glycol  17 g Oral Daily   rOPINIRole   0.5 mg Oral QHS   sertraline   100 mg Oral q AM   sertraline   25 mg Oral q AM   sucralfate   1 g Oral QID   thiamine   100 mg Oral Daily   Continuous Infusions:  sodium chloride  100 mL/hr at 05/26/24 0902     LOS: 1 day     Devaughn KATHEE Ban, MD [facility]   If 7PM-7AM, please contact night-coverage [email or web address] Password [email or web address] 05/26/2024, 11:42 AM

## 2024-05-26 NOTE — Plan of Care (Signed)
  Problem: Education: Goal: Knowledge of General Education information will improve Description: Including pain rating scale, medication(s)/side effects and non-pharmacologic comfort measures Outcome: Progressing   Problem: Health Behavior/Discharge Planning: Goal: Ability to manage health-related needs will improve Outcome: Progressing   Problem: Clinical Measurements: Goal: Ability to maintain clinical measurements within normal limits will improve Outcome: Progressing Goal: Will remain free from infection Outcome: Progressing Goal: Diagnostic test results will improve Outcome: Progressing Goal: Respiratory complications will improve Outcome: Progressing Goal: Cardiovascular complication will be avoided Outcome: Progressing   Problem: Nutrition: Goal: Adequate nutrition will be maintained Outcome: Progressing   Problem: Coping: Goal: Level of anxiety will decrease Outcome: Progressing   Problem: Skin Integrity: Goal: Risk for impaired skin integrity will decrease Outcome: Progressing   Problem: Fluid Volume: Goal: Ability to maintain a balanced intake and output will improve Outcome: Progressing   Problem: Nutritional: Goal: Maintenance of adequate nutrition will improve Outcome: Progressing Goal: Progress toward achieving an optimal weight will improve Outcome: Progressing   Problem: Tissue Perfusion: Goal: Adequacy of tissue perfusion will improve Outcome: Progressing

## 2024-05-26 NOTE — Consult Note (Signed)
 Ruel Kung , MD [address], [address], [place], [place], [address] Phone: [phone] Fax: [phone]  Consultation  Referring Provider:  Dr Lawence  Primary Care Physician:  Cordella Corning, FNP Primary Gastroenterologist: [facility]         Reason for Consultation:     Abnormal LFTs and dysphagia  Date of Admission:  05/25/2024 Date of Consultation:  05/26/2024         HPI:   Deborah Obrien is a 83 y.o. female as previously seen by myself in [facility] and used to work last office visit back in November 2022.  Known to have had a severe upper GI bleed in 2019 where a large gastric polyp that was bleeding subsequently resected.  Subsequent upper endoscopy has been resected multiple gastric polyps negative for malignancy she done well at that point of time.  She presented to the hospital with dyspnea nausea vomiting epigastric discomfort for over 12 hours initially I was consulted to see the patient for abnormal LFTs but in the morning Dr. Kandis suggested to see the patient for dysphagia rather than the abnormal LFTs as they have been trending down and very likely related to severe tricuspid regurgitation which the patient is known to suffer from.  She says that nausea vomiting began a few days back all of a sudden.  Gradually getting better but she has been having difficulty swallowing for about 6 months solids more than liquids.  Lost some weight Folet sometimes get stuck she is on apixaban  and recollects her last dose was taken on 05/25/2024   In the emergency room she underwent a CT scan of the abdomen pelvis without contrast that showed moderate stool burden no evidence of appendicitis.  Subsequently underwent a right upper quadrant ultrasound showed no gallbladder otherwise the ultrasound was unremarkable.  She underwent labwork in the emergency room found to have an elevated creatinine of 1.63 AST of 87 and 64 this morning yesterday her  transaminases were higher with AST of 106 and ALT of 73 and alkaline phosphatase 134.  Hemoglobin yesterday was 14.1 today is 10.7 baseline around 11.74 months back.  Platelet count 115.  She is being treated for nausea vomiting at this point of time.    Past Medical History:  Diagnosis Date   Acute respiratory failure (HCC) 04/09/2018   Anemia    Anxiety    Aortic atherosclerosis (HCC)    Bowel obstruction (HCC)    Carotid artery disease (HCC)    Cerebral microvascular disease    Chronic heart failure with preserved ejection fraction (HFpEF) (HCC)    a.) TTE 04/10/2018: EF 60-65%, mildly dil LA/RA, mod dil RV, mod TR, PASP ; b.) TTE 01/22/2023: EF 60-65%, no rwma, low-nl RV fxn, RVSP 51.2 mmHg. Mild MR. Mod-sev TR.   CKD (chronic kidney disease), stage IV (HCC)    Closed fracture of one rib of right side 05/29/2020   Colon polyp    Complete tear of left rotator cuff 10/16/2014   Coronary artery disease (non-obstructive) 08/25/2016   a.) LHC 08/25/2016:  25% oLM-LM, 20% mLAD, 15% o-pLCx - med mgmt; b.) MV 10/20/2017: no isch/infarct, EF 55-65%   DDD (degenerative disc disease), cervical    a.) s/p ACDF C6-C7   Depression    Diabetic neuropathy (HCC)    Diarrhea 03/04/2022   Displaced intertrochanteric fracture of right femur (HCC)    Fatty liver    Gastritis without bleeding    GERD (gastroesophageal reflux disease)  GI bleed    Gout    Hypertension    Hypothyroidism    Intractable nausea and vomiting 05/25/2024   Long term current use of amiodarone     Low back pain    history of kyphoplasty t12-l1   Morbid obesity (HCC)    Multiple gastric polyps    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   a.) presurgical PCR (+) 10/10/2023 prior to REMOVAL OF NAIL HARDWARE WITH SCREW EXCHANGE (RIGHT HIP)   On apixaban  therapy    Osteoarthritis    Osteoporosis    PAF (paroxysmal atrial fibrillation) (HCC) 2016   a.) CHA2DS2-VASc = 9 (age x 2, sex, HFpEF, HTN, TIA x 2, vascular  disease, T2DM) as of 10/13/2023; b.) s/p DCCV 06/28/2017 (120 J x1), 02/06/2018 (150c J x 1), 07/15/2020 (75 J x ); c.) ardiac rate/rhythm maintained on oral amiodarone ; chronically anticoagulated using apixaban    Presence of permanent cardiac pacemaker 07/01/2015   a.) PPM placed 07/01/2015; b.) device upgraded to dual chamber MDT Azure XT DR MRI SureScan (SN: [id number]) 08/29/2017   Pulmonary hypertension (HCC)    a.) TTE 04/10/2018: PASP 50 mmHg; b.) TTE 01/22/2023: RVSP 51.2 mmHg   Sinus node dysfunction (HCC)    a.) s/p PPM placement 06/2015 --> device upgraded 08/2017   T2DM (type 2 diabetes mellitus) (HCC)    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 plate & screws    BACK SURGERY  2019   patient describes kyphoplasty for compression fractures, MD referral said fusion   BREAST BIOPSY Right 04/2015   stereo fibroadenomatous change, neg for atypia   BREAST EXCISIONAL BIOPSY Left    neg   CARDIAC CATHETERIZATION Left 08/25/2016   Procedure: Left Heart Cath and Coronary Angiography;  Surgeon: Wolm JINNY Rhyme, MD;  Location: [facility] INVASIVE CV LAB;  Service: Cardiovascular;  Laterality: Left;   CARDIOVERSION N/A 06/28/2017   Procedure: CARDIOVERSION;  Surgeon: Rhyme Wolm JINNY, MD;  Location: [facility] ORS;  Service: Cardiovascular;  Laterality: N/A;   CARDIOVERSION N/A 02/06/2018   Procedure: CARDIOVERSION;  Surgeon: Delford Maude BROCKS, MD;  Location: [facility] ENDOSCOPY;  Service: Cardiovascular;  Laterality: N/A;   CARDIOVERSION N/A 07/15/2020    Procedure: CARDIOVERSION;  Surgeon: Jeffrie Oneil BROCKS, MD;  Location: [facility] ENDOSCOPY;  Service: Cardiovascular;  Laterality: N/A;   CATARACT EXTRACTION, BILATERAL  2012   CHOLECYSTECTOMY     colon blockage  1999   COLON SURGERY  2000   removed 20% of colon. colon had collapsed   COLONOSCOPY  2016   polyps removed 2016   COLONOSCOPY N/A 01/10/2024   Procedure: COLONOSCOPY;  Surgeon: Jinny Carmine, MD;  Location: [facility] ENDOSCOPY;  Service: Endoscopy;  Laterality: N/A;   DORSAL COMPARTMENT RELEASE Left 09/14/2016   Procedure: RELEASE DORSAL COMPARTMENT (DEQUERVAIN);  Surgeon: Lynwood SHAUNNA Hue, MD;  Location: [facility] ORS;  Service: Orthopedics;  Laterality: Left;   ESOPHAGOGASTRODUODENOSCOPY N/A 01/27/2015   Procedure: ESOPHAGOGASTRODUODENOSCOPY (EGD);  Surgeon: Gladis RAYMOND Mariner, MD;  Location: [facility] ENDOSCOPY;  Service: Endoscopy;  Laterality: N/A;   ESOPHAGOGASTRODUODENOSCOPY N/A  01/10/2024   Procedure: EGD (ESOPHAGOGASTRODUODENOSCOPY);  Surgeon: Jinny Carmine, MD;  Location: [facility] ENDOSCOPY;  Service: Endoscopy;  Laterality: N/A;   ESOPHAGOGASTRODUODENOSCOPY (EGD) WITH PROPOFOL  N/A 07/24/2015   Procedure: ESOPHAGOGASTRODUODENOSCOPY (EGD) WITH PROPOFOL ;  Surgeon: Carmine Jinny, MD;  Location: [facility] ENDOSCOPY;  Service: Endoscopy;  Laterality: N/A;   ESOPHAGOGASTRODUODENOSCOPY (EGD) WITH PROPOFOL  N/A 04/12/2018   Procedure: ESOPHAGOGASTRODUODENOSCOPY (EGD) WITH PROPOFOL ;  Surgeon: Therisa Bi, MD;  Location: [facility] ENDOSCOPY;  Service: Gastroenterology;  Laterality: N/A;   ESOPHAGOGASTRODUODENOSCOPY (EGD) WITH PROPOFOL  N/A 06/22/2018   Procedure: ESOPHAGOGASTRODUODENOSCOPY (EGD) WITH PROPOFOL ;  Surgeon: Teressa Toribio SQUIBB, MD;  Location: [facility] ENDOSCOPY;  Service: Endoscopy;  Laterality: N/A;   ESOPHAGOGASTRODUODENOSCOPY (EGD) WITH PROPOFOL  N/A 07/09/2018   Procedure: ESOPHAGOGASTRODUODENOSCOPY (EGD) WITH PROPOFOL  with resection of gastric polyps;  Surgeon: Therisa Bi, MD;  Location: [facility] ENDOSCOPY;  Service:  Gastroenterology;  Laterality: N/A;   ESOPHAGOGASTRODUODENOSCOPY (EGD) WITH PROPOFOL  N/A 05/17/2019   Procedure: ESOPHAGOGASTRODUODENOSCOPY (EGD) WITH PROPOFOL ;  Surgeon: Therisa Bi, MD;  Location: [facility] ENDOSCOPY;  Service: Gastroenterology;  Laterality: N/A;   EUS N/A 06/22/2018   Procedure: UPPER ENDOSCOPIC ULTRASOUND (EUS) RADIAL;  Surgeon: Teressa Toribio SQUIBB, MD;  Location: [facility] ENDOSCOPY;  Service: Endoscopy;  Laterality: N/A;   EYE SURGERY Bilateral    cataract extractions   HARDWARE REMOVAL Right 10/17/2023   Procedure: Right hip removal of nail hardware with screw exchange;  Surgeon: Tobie Priest, MD;  Location: [facility] ORS;  Service: Orthopedics;  Laterality: Right;   HUMERUS IM NAIL Left 06/02/2022   Procedure: INTRAMEDULLARY (IM) NAIL HUMERAL;  Surgeon: Edie Norleen PARAS, MD;  Location: [facility] ORS;  Service: Orthopedics;  Laterality: Left;   INTRAMEDULLARY (IM) NAIL INTERTROCHANTERIC Right 01/21/2023   Procedure: INTRAMEDULLARY (IM) NAIL INTERTROCHANTERIC;  Surgeon: Tobie Priest, MD;  Location: [facility] ORS;  Service: Orthopedics;  Laterality: Right;   JOINT REPLACEMENT Bilateral 2014   Bilateral Knee replacement   LEAD REVISION/REPAIR N/A 08/29/2017   Procedure: LEAD REVISION/REPAIR;  Surgeon: Inocencio Soyla Lunger, MD;  Location: [facility] INVASIVE CV LAB;  Service: Cardiovascular;  Laterality: N/A;   OOPHORECTOMY     PACEMAKER INSERTION Left 07/01/2015   Procedure: INSERTION PACEMAKER;  Surgeon: Marsa Dooms, MD;  Location: [facility] ORS;  Service: Cardiovascular;  Laterality: Left;   PACEMAKER REVISION N/A 08/28/2017   Procedure: PACEMAKER REVISION;  Surgeon: Fernande Elspeth BROCKS, MD;  Location: [facility] INVASIVE CV LAB;  Service: Cardiovascular;  Laterality: N/A;   REPLACEMENT TOTAL KNEE BILATERAL     SHOULDER ARTHROSCOPY WITH ROTATOR CUFF REPAIR AND OPEN BICEPS TENODESIS Left 11/21/2019   Procedure: LEFT SHOULDER ARTHROSCOPY WITH DEBRIDEMENT, DECOMPRESSION, ROTATOR CUFF REPAIR AND BICEPS TENOLYSIS;  Surgeon:  Edie Norleen PARAS, MD;  Location: [facility] ORS;  Service: Orthopedics;  Laterality: Left;   SHOULDER ARTHROSCOPY WITH SUBACROMIAL DECOMPRESSION Left 2013   TEE WITHOUT CARDIOVERSION N/A 06/28/2017   Procedure: TRANSESOPHAGEAL ECHOCARDIOGRAM (TEE);  Surgeon: Hester Wolm PARAS, MD;  Location: [facility] ORS;  Service: Cardiovascular;  Laterality: N/A;   TUBAL LIGATION      Prior to Admission medications   Medication Sig Start Date End Date Taking? Authorizing Provider  acetaminophen  (TYLENOL ) 500 MG tablet Take 2 tablets (1,000 mg total) by mouth every 8 (eight) hours. 10/17/23 10/16/24  Tobie Priest, MD  albuterol  (VENTOLIN  HFA) 108 [phone] Base) MCG/ACT inhaler Inhale 2 puffs into the lungs every 6 (six) hours as needed for wheezing or shortness of breath (cough).    [provider]  allopurinol  (ZYLOPRIM ) 100 MG tablet Take 100 mg by mouth in the morning.  [provider]  alum & mag hydroxide-simeth (MAALOX/MYLANTA) 200-200-20 MG/5ML suspension Take 10 mLs by mouth 3 (three) times daily as needed for indigestion or heartburn.    [provider]  amiodarone  (PACERONE ) 200 MG tablet Take 0.5 tablets (100 mg total) by mouth daily. 07/11/23   Riddle, Suzann, NP  apixaban  (ELIQUIS ) 2.5 MG TABS tablet Take 1 tablet (2.5 mg total) by mouth 2 (two) times daily. 03/06/22   Alexander, Natalie, DO  Cholecalciferol  (VITAMIN D ) 50 MCG (2000 UT) CAPS Take 2,000 Units by mouth in the morning.    [provider]  CLARITIN  10 MG tablet Take 10 mg by mouth daily. 12/18/23   [provider]  docusate sodium  (COLACE) 100 MG capsule Take 100 mg by mouth 2 (two) times daily.    [provider]  Ergocalciferol  (VITAMIN D2) 50 MCG (2000 UT) TABS Take 1 tablet by mouth daily.    [provider]  famotidine  (PEPCID ) 10 MG tablet Take 2 tablets (20 mg total) by mouth 2 (two) times daily. 05/14/24 06/13/24  Viviann Pastor, MD  Fluticasone Furoate [facility] SENSIMIST NA) Place 1 spray  into the nose in the morning.    [provider]  furosemide  (LASIX ) 20 MG tablet Take 1 tablet (20 mg total) by mouth daily. 02/02/23   Josette Ade, MD  gabapentin  (NEURONTIN ) 300 MG capsule Take 600 mg by mouth 3 (three) times daily. (0800, 1400 & 2000)    [provider]  ibandronate (BONIVA) 150 MG tablet Take 150 mg by mouth every 30 (thirty) days. Take in the morning with a full glass of water , on an empty stomach, and do not take anything else by mouth or lie down for the next 30 min.    [provider]  iron  polysaccharides (NIFEREX) 150 MG capsule Take 1 capsule (150 mg total) by mouth daily. 11/07/23   Laurita Pillion, MD  LANTUS  100 UNIT/ML injection Inject 0.19 mLs (19 Units total) into the skin at bedtime. 01/11/24   Alexander, Natalie, DO  levothyroxine  (SYNTHROID ) 75 MCG tablet Take 37.5 mcg by mouth daily before breakfast. (0600)    [provider]  magnesium  hydroxide (MILK OF MAGNESIA) 400 MG/5ML suspension Take 30 mLs by mouth daily as needed (constipation).    [provider]  Multiple Vitamin (MULTIVITAMIN WITH MINERALS) TABS tablet Take 1 tablet by mouth daily. 02/02/23   Josette Ade, MD  nystatin  powder Apply 1 Application topically in the morning and at bedtime. (0800 & 2000) Apply topically to groin    [provider]  ondansetron  (ZOFRAN -ODT) 4 MG disintegrating tablet Take 1 tablet (4 mg total) by mouth every 8 (eight) hours as needed for nausea or vomiting. 10/17/23   Tobie Priest, MD  pantoprazole  (PROTONIX ) 40 MG tablet Take 1 tablet (40 mg total) by mouth 2 (two) times daily before a meal. 01/11/24   Marsa Edelman, DO  polyethylene glycol (MIRALAX  / GLYCOLAX ) 17 g packet Take 17 g by mouth daily. 02/01/23   Josette Ade, MD  Polyvinyl Alcohol -Povidone PF 1.4-0.6 % SOLN Place 1 drop into both eyes in the morning and at bedtime.    [provider]  rOPINIRole  (REQUIP ) 0.5 MG tablet Take 0.5 mg by mouth at  bedtime. (2000)    [provider]  senna (SENOKOT) 8.6 MG tablet Take 1 tablet (8.6 mg total) by mouth at bedtime as needed for constipation. 01/11/24   Alexander, Natalie, DO  sertraline  (ZOLOFT ) 100 MG tablet Take 100 mg  by mouth in the morning. (0800) 100 mg + 25 mg=125 mg    [provider]  solifenacin (VESICARE) 5 MG tablet Take 5 mg by mouth in the morning. (0800)    [provider]  sucralfate  (CARAFATE ) 1 g tablet Take 1 tablet (1 g total) by mouth 4 (four) times daily. 05/14/24   Viviann Pastor, MD  thiamine  (VITAMIN B-1) 100 MG tablet Take 1 tablet (100 mg total) by mouth daily. 02/02/23   Josette Ade, MD  traMADol  (ULTRAM ) 50 MG tablet Take 1 tablet (50 mg total) by mouth every 6 (six) hours as needed for moderate pain (pain score 4-6). 11/06/23   Laurita Pillion, MD    Family History  Problem Relation Age of Onset   Diabetes Mellitus II Mother    CAD Mother    Heart attack Mother    Cancer Father        skin   Diabetes Mellitus II Brother    Stroke Brother    Breast cancer Neg Hx      Social History   Tobacco Use   Smoking status: Never   Smokeless tobacco: Never  Vaping Use   Vaping status: Never Used  Substance Use Topics   Alcohol  use: No   Drug use: No    Allergies as of 05/25/2024 - Review Complete 05/25/2024  Allergen Reaction Noted   Fish allergy Shortness Of Breath 11/26/2015   Macrolides and ketolides Other (See Comments) 01/12/2015   Meperidine Shortness Of Breath, Nausea Only, and Other (See Comments) 01/19/2015   Other Nausea Only, Swelling, Rash, Anaphylaxis, Diarrhea, Shortness Of Breath, and Other (See Comments) 01/12/2015   Prednisone Anaphylaxis and Other (See Comments) 01/19/2015   Shellfish allergy Anaphylaxis, Shortness Of Breath, Diarrhea, Nausea Only, and Rash 06/29/2015   Sulfa antibiotics Shortness Of Breath, Diarrhea, Nausea Only, and Other (See Comments) 06/25/2013   Sulfacetamide sodium Diarrhea, Nausea Only,  Other (See Comments), and Shortness Of Breath 08/12/2015   Telbivudine Other (See Comments) 08/12/2015   Uloric [febuxostat] Anaphylaxis 08/22/2016   Aspirin  Rash and Other (See Comments) 04/07/2014   Celecoxib Other (See Comments) 01/19/2015   Cephalexin Diarrhea, Nausea Only, and Other (See Comments) 01/19/2015   Codeine Diarrhea, Nausea Only, and Other (See Comments) 01/19/2015   Dilaudid  [hydromorphone  hcl] Other (See Comments) 07/03/2018   Erythromycin Diarrhea, Nausea Only, and Other (See Comments) 01/19/2015   Iodinated contrast media Rash and Other (See Comments) 01/19/2015   Lipitor [atorvastatin calcium ] Nausea Only 07/03/2018   Oxycodone  Other (See Comments) 01/19/2015   Aleve [naproxen]  07/03/2018   Atorvastatin Nausea Only and Other (See Comments) 01/19/2015   Cephalosporins Diarrhea, Nausea Only, and Other (See Comments) 01/12/2015   Doxycycline  08/24/2017   Iodine Rash 06/29/2015   Motrin [ibuprofen]  07/03/2018   Tape Other (See Comments) 06/29/2015   Valdecoxib Swelling and Other (See Comments) 01/19/2015    Review of Systems:    All systems reviewed and negative except where noted in HPI.   Physical Exam:  Vital signs in last 24 hours: Temp:  [98 F (36.7 C)-99.4 F (37.4 C)] 98.6 F (37 C) (09/14 0820) Pulse Rate:  [59-69] 61 (09/14 0820) Resp:  [13-21] 18 (09/14 0820) BP: (107-128)/(42-69) 114/50 (09/14 0820) SpO2:  [91 %-100 %] 99 % (09/14 0820) Weight:  [70.3 kg] 70.3 kg (09/13 2342) Last BM Date : 05/24/24 General:   Pleasant, cooperative in NAD Head:  Normocephalic and atraumatic. Eyes:   No icterus.   Conjunctiva pink. PERRLA. Ears:  Normal auditory acuity. Neck:  Supple; no masses or thyroidomegaly Lungs: Respirations even and unlabored. Lungs clear to auscultation bilaterally.   No wheezes, crackles, or rhonchi.  Heart:  Regular rate and rhythm;  Without murmur, clicks, rubs or gallops Abdomen:  Soft, nondistended, nontender. Normal bowel  sounds. No appreciable masses or hepatomegaly.  No rebound or guarding.  Neurologic:  Alert and oriented x3;  grossly normal neurologically. Psych:  Alert and cooperative. Normal affect.  LAB RESULTS: Recent Labs    05/25/24 1734 05/26/24 0419  WBC 7.2 3.7*  HGB 14.1 10.7*  HCT 44.0 32.7*  PLT 98* 115*   BMET Recent Labs    05/25/24 1734 05/26/24 0419  NA 137 138  K 4.8 4.3  CL 103 107  CO2 21* 23  GLUCOSE 337* 253*  BUN 57* 46*  CREATININE 1.63* 1.37*  CALCIUM  8.8* 8.0*   LFT Recent Labs    05/26/24 0419  PROT 5.9*  ALBUMIN  3.0*  AST 87*  ALT 64*  ALKPHOS 113  BILITOT 1.2   PT/INR No results for input(s): LABPROT, INR in the last 72 hours.  STUDIES: US  ABDOMEN LIMITED RUQ (LIVER/GB) Result Date: 05/25/2024 CLINICAL DATA:  Nausea and von vomiting, right upper quadrant pain, transaminitis EXAM: ULTRASOUND ABDOMEN LIMITED RIGHT UPPER QUADRANT COMPARISON:  05/25/2024 FINDINGS: Gallbladder: Surgically absent. Common bile duct: Diameter: 10 mm Liver: No focal lesion identified. Within normal limits in parenchymal echogenicity. Portal vein is patent on color Doppler imaging with normal direction of blood flow towards the liver. Other: None. IMPRESSION: 1. Unremarkable right upper quadrant ultrasound in a patient status post cholecystectomy. Electronically Signed   By: Ozell Daring M.D.   On: 05/25/2024 21:31   CT ABDOMEN PELVIS WO CONTRAST Result Date: 05/25/2024 EXAM: CT ABDOMEN AND PELVIS WITHOUT CONTRAST 05/25/2024 06:55:38 PM TECHNIQUE: CT of the abdomen and pelvis was performed without the administration of intravenous contrast. Multiplanar reformatted images are provided for review. Automated exposure control, iterative reconstruction, and/or weight-based adjustment of the mA/kV was utilized to reduce the radiation dose to as low as reasonably achievable. COMPARISON: None available. CLINICAL HISTORY: Eval appy. RLQ TTP. N/V, recently diagnosed hiatal hernia. RLQ  pain. FINDINGS: LOWER CHEST: Patient leads noted within the right heart. Cardiac size within normal limits. Visualized lung bases are clear. LIVER: Status post cholecystectomy. GALLBLADDER AND BILE DUCTS: Status post cholecystectomy. SPLEEN: No acute abnormality. PANCREAS: No acute abnormality. ADRENAL GLANDS: No acute abnormality. KIDNEYS, URETERS AND BLADDER: No stones in the kidneys or ureters. No hydronephrosis. No perinephric or periureteral stranding. Urinary bladder is unremarkable. GI AND BOWEL: Status post distal colectomy with colorectal anastomotic staple line noted. Appendix is not clearly identified, however, there are no secondary signs of appendicitis within the right lower quadrant. Moderate colon stool burden. Stomach, small bowel, and large bowel are otherwise unremarkable. No free intraperitoneal gas or fluid. PERITONEUM AND RETROPERITONEUM: No free intraperitoneal gas or fluid. VASCULATURE: Moderate aortoiliac atherosclerotic calcification. LYMPH NODES: Surgical clips within the pelvis may relate to prior pelvic lymph node dissection. REPRODUCTIVE ORGANS: Status post hysterectomy. No adnexal masses are seen. BONES AND SOFT TISSUES: Status post right hip ORIF. Status post L4-S1 lumbar fusion with instrumentation. Remote severe anterior wedge compression deformities of T12 and L1 are identified, status post vertebroplasty with residual accentuated thoracolumbar kyphosis. Mild retropulsion of the posterosuperior aspect of the T12 vertebral body results in moderate central canal stenosis. No acute bone abnormality. No lytic or blastic bone lesion. IMPRESSION: 1. No CT evidence of appendicitis. Appendix  not clearly identified. 2. Moderate colon stool burden. Electronically signed by: Dorethia Molt MD 05/25/2024 07:23 PM EDT RP Workstation: [id number]   DG Chest Portable 1 View Result Date: 05/25/2024 CLINICAL DATA:  SOB, hypoxic. hx PPM, ILD, dCHF EXAM: PORTABLE CHEST 1 VIEW COMPARISON:  Chest  x-ray 10/28/2023, CT chest 05/14/2024 FINDINGS: Left chest wall dual lead pacemaker. \ The heart and mediastinal contours are unchanged. Atherosclerotic plaque. Prominent hilar vasculature. No focal consolidation. No pulmonary edema. No pleural effusion. No pneumothorax. No acute osseous abnormality. Partially visualized intramedullary nail fixation of the left humerus. IMPRESSION: 1. Pulmonary venous congestion.  No overt edema. 2.  Aortic Atherosclerosis (ICD10-I70.0). Electronically Signed   By: Morgane  Naveau M.D.   On: 05/25/2024 17:53      Impression / Plan:   Deborah Obrien is a 83 y.o. y/o female with chronic heart failure, hypertension coronary artery disease on amiodarone  sertraline  tramadol  as per her med list, place maker, moderate to severe tricuspid regurgitation presents to the hospital with nausea vomiting shortness of breath.  Presently the main issue I have been consulted is to see the patient for dysphagia ongoing for over 6 months solids more than liquids.  She says that food gets stuck at some points when she eats and she has lost weight.  The patient has had an upper endoscopy in April 2025 which showed no gross abnormalities her last dose of Eliquis  was taken yesterday on 05/25/2024 the earliest 1 can do an upper endoscopy and possible dilation if there is a stricture would be on 05/28/2024.  Apixaban  has been held in the meanwhile it may not be unreasonable to get a barium swallow with a tablet to determine if it is mostly a motility issue and decide based on that if another endoscopy would add to her management and if yes we can proceed with that on Tuesday.  I will place the order.  Dr.Wo will be following the patient from tomorrowhl     Thank you for involving me in the care of this patient.      LOS: 1 day   Ruel Kung, MD  05/26/2024, 11:15 AM

## 2024-05-26 NOTE — Progress Notes (Signed)
 PHARMACIST - PHYSICIAN COMMUNICATION  CONCERNING:  Enoxaparin  (Lovenox ) for DVT Prophylaxis    RECOMMENDATION: Patient was prescribed enoxaprin 40mg  q24 hours for VTE prophylaxis.   Filed Weights   05/25/24 2342  Weight: 70.3 kg (154 lb 15.7 oz)    Body mass index is 27.45 kg/m.  Estimated Creatinine Clearance: 29.3 mL/min (A) (by C-G formula based on SCr of 1.37 mg/dL (H)).   Patient is candidate for enoxaparin  30mg  every 24 hours based on CrCl <70ml/min or Weight <45kg  DESCRIPTION: Pharmacy has adjusted enoxaparin  dose per [facility] policy.  Patient is now receiving enoxaparin  30 mg every 24 hours    Kayla JULIANNA Blew, PharmD Clinical Pharmacist  05/26/2024 12:03 PM

## 2024-05-27 ENCOUNTER — Inpatient Hospital Stay

## 2024-05-27 DIAGNOSIS — R748 Abnormal levels of other serum enzymes: Secondary | ICD-10-CM

## 2024-05-27 DIAGNOSIS — R110 Nausea: Secondary | ICD-10-CM

## 2024-05-27 DIAGNOSIS — R112 Nausea with vomiting, unspecified: Secondary | ICD-10-CM | POA: Diagnosis not present

## 2024-05-27 LAB — COMPREHENSIVE METABOLIC PANEL WITH GFR
ALT: 69 U/L — ABNORMAL HIGH (ref 0–44)
AST: 80 U/L — ABNORMAL HIGH (ref 15–41)
Albumin: 3.5 g/dL (ref 3.5–5.0)
Alkaline Phosphatase: 129 U/L — ABNORMAL HIGH (ref 38–126)
Anion gap: 8 (ref 5–15)
BUN: 36 mg/dL — ABNORMAL HIGH (ref 8–23)
CO2: 25 mmol/L (ref 22–32)
Calcium: 8.5 mg/dL — ABNORMAL LOW (ref 8.9–10.3)
Chloride: 109 mmol/L (ref 98–111)
Creatinine, Ser: 1.22 mg/dL — ABNORMAL HIGH (ref 0.44–1.00)
GFR, Estimated: 44 mL/min — ABNORMAL LOW (ref >60)
Glucose, Bld: 57 mg/dL — ABNORMAL LOW (ref 70–99)
Potassium: 4 mmol/L (ref 3.5–5.1)
Sodium: 142 mmol/L (ref 135–145)
Total Bilirubin: 0.8 mg/dL (ref 0.0–1.2)
Total Protein: 6.7 g/dL (ref 6.5–8.1)

## 2024-05-27 LAB — CBC
HCT: 35.9 % — ABNORMAL LOW (ref 36.0–46.0)
Hemoglobin: 11.7 g/dL — ABNORMAL LOW (ref 12.0–15.0)
MCH: 32.8 pg (ref 26.0–34.0)
MCHC: 32.6 g/dL (ref 30.0–36.0)
MCV: 100.6 fL — ABNORMAL HIGH (ref 80.0–100.0)
Platelets: 129 K/uL — ABNORMAL LOW (ref 150–400)
RBC: 3.57 MIL/uL — ABNORMAL LOW (ref 3.87–5.11)
RDW: 12.3 % (ref 11.5–15.5)
WBC: 3.4 K/uL — ABNORMAL LOW (ref 4.0–10.5)
nRBC: 0 % (ref 0.0–0.2)

## 2024-05-27 LAB — GLUCOSE, CAPILLARY
Glucose-Capillary: 51 mg/dL — ABNORMAL LOW (ref 70–99)
Glucose-Capillary: 79 mg/dL (ref 70–99)
Glucose-Capillary: 143 mg/dL — ABNORMAL HIGH (ref 70–99)
Glucose-Capillary: 212 mg/dL — ABNORMAL HIGH (ref 70–99)
Glucose-Capillary: 73 mg/dL (ref 70–99)

## 2024-05-27 MED ORDER — ENOXAPARIN SODIUM 40 MG/0.4ML IJ SOSY
40.0000 mg | PREFILLED_SYRINGE | INTRAMUSCULAR | Status: DC
  Administered 2024-05-28 – 2024-05-30 (×3): 40 mg via SUBCUTANEOUS
  Filled 2024-05-27 (×3): qty 0.4

## 2024-05-27 MED ORDER — INSULIN ASPART 100 UNIT/ML IJ SOLN
0.0000 [IU] | Freq: Three times a day (TID) | INTRAMUSCULAR | Status: DC
  Administered 2024-05-27: 3 [IU] via SUBCUTANEOUS
  Administered 2024-05-28: 2 [IU] via SUBCUTANEOUS
  Administered 2024-05-28: 3 [IU] via SUBCUTANEOUS
  Administered 2024-05-28: 5 [IU] via SUBCUTANEOUS
  Administered 2024-05-29 (×2): 3 [IU] via SUBCUTANEOUS
  Administered 2024-05-29: 1 [IU] via SUBCUTANEOUS
  Administered 2024-05-30: 3 [IU] via SUBCUTANEOUS
  Administered 2024-05-30 – 2024-05-31 (×3): 5 [IU] via SUBCUTANEOUS
  Administered 2024-05-31: 3 [IU] via SUBCUTANEOUS
  Filled 2024-05-27 (×12): qty 1

## 2024-05-27 MED ORDER — GLUCAGON HCL RDNA (DIAGNOSTIC) 1 MG IJ SOLR
1.0000 mg | Freq: Once | INTRAMUSCULAR | Status: AC | PRN
  Administered 2024-05-27: 1 mg via INTRAVENOUS
  Filled 2024-05-27: qty 1

## 2024-05-27 MED ORDER — BOOST / RESOURCE BREEZE PO LIQD CUSTOM
1.0000 | Freq: Three times a day (TID) | ORAL | Status: DC
  Administered 2024-05-27 – 2024-05-30 (×11): 1 via ORAL

## 2024-05-27 NOTE — Inpatient Diabetes Management (Signed)
 Inpatient Diabetes Program Recommendations  AACE/ADA: New Consensus Statement on Inpatient Glycemic Control (2015)  Target Ranges:  Prepandial:   less than 140 mg/dL      Peak postprandial:   less than 180 mg/dL (1-2 hours)      Critically ill patients:  140 - 180 mg/dL   Lab Results  Component Value Date   GLUCAP 143 (H) 05/27/2024   HGBA1C 8.0 (H) 05/26/2024    Latest Reference Range & Units 05/25/24 23:16 05/26/24 13:29 05/26/24 16:28 05/26/24 20:47 05/27/24 07:24 05/27/24 08:19 05/27/24 11:54  Glucose-Capillary 70 - 99 mg/dL 734 (H) 770 (H) 792 (H) Novolog  5 units 92 51 (L) 79 143 (H) Novolog  2 units  (H): Data is abnormally high (L): Data is abnormally low  Diabetes history: DM2 Outpatient Diabetes medications: Lantus  17 units daily Novolin R 1-10 units tid meal coverage Current orders for Inpatient glycemic control:  Novolog  0-15 units tid, 0-5 units hs  Inpatient Diabetes Program Recommendations:   Patient had hypoglycemia this am. Please consider: -Decrease Novolog  correction to 0-9 units tid, 0-5 units hs -Add Lantus  8 units if fasting blood glucose >100  Thank you, Dagoberto E. Adaiah Morken, RN, MSN, CNS, CDCES  Diabetes Coordinator Inpatient Glycemic Control Team Team Pager [phone] (8am-5pm) 05/27/2024 3:14 PM

## 2024-05-27 NOTE — Plan of Care (Signed)
   Problem: Activity: Goal: Risk for activity intolerance will decrease Outcome: Progressing   Problem: Coping: Goal: Level of anxiety will decrease Outcome: Progressing   Problem: Pain Managment: Goal: General experience of comfort will improve and/or be controlled Outcome: Progressing

## 2024-05-27 NOTE — TOC CM/SW Note (Signed)
 Transition of Care [facility]) - Inpatient Brief Assessment   Patient Details  Name: Deborah Obrien MRN: [id number] Date of Birth: [birth year]  Transition of Care [facility]) CM/SW Contact:    Alfonso Rummer, LCSW Phone Number: 05/27/2024, 11:12 AM   Clinical Narrative:  LCSW A. Dalayah Deahl spk with pt in room 101. Pt was alert and engaged during TOC assessment. Pt reports she resides at [facility]. Pt reports son is next of kin and handles pt's affairs. Pt does not report barriers to affording medication, transportation or food. LCSW A Caio Devera contacted [facility] spk with Cherylle pt is a resident and able to return when medically ready for discharge. No further TOC needs identified please contact should need arise.   Transition of Care Asessment:

## 2024-05-27 NOTE — Progress Notes (Signed)
 PROGRESS NOTE    Deborah Obrien  FMW:[id number] DOB: 1940/10/05 DOA: 05/25/2024 PCP: Cordella Corning, FNP     Brief Narrative:   From admission h and p  Deborah Obrien is a 83 y.o. Caucasian homeless female with medical history significant for anxiety, carotid artery stenosis, chronic heart failure with preserved EF, coronary artery disease, hypertension, and hypothyroidism, who presented to the emergency room with acute onset of dyspnea initially as well as recurrent nausea and vomiting with epigastric abdominal pain over the last 12 hours.  She was not dyspneic during my interview.  Despite given Zofran  at her SNF she continued to have recurrent vomiting.  No fever or chills.  No chest pain or palpitations.  No cough or wheezing or dyspnea.  No dysuria, oliguria or hematuria or flank pain.     Assessment & Plan:   Principal Problem:   Intractable nausea and vomiting Active Problems:   Elevated LFTs   Hypothyroidism   Paroxysmal atrial fibrillation (HCC)   Type 2 diabetes mellitus without complications (HCC)   CAD (coronary artery disease)   Gout   Cardiac pacemaker in situ   Depression   CKD stage 3b, GFR 30-44 ml/min (HCC)  # Epigastric abdominal pain # Nausea/vomiting One day acute onset of these symptoms. Emesis nbnb. No further vomiting but still w/ epigastric pain. CT abdomen unremarkable, though hiatal hernia seen on CT a week ago. No sig pathology seen on EGD earlier this year. Suspect this hernia may be to blame. Last apixaban  AM of 9/14. No report of bleeding. Does have mid stable transaminitis that is chronic, mildly elevated t bili that has normalized, is s/p cholecystectomy and ct and u/s do not show ductal dilation, doubt therefore obstructive process. Esophogram shows mild esophageal dysmotility, possible small hiatal hernia. 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   # Gout Asymptomatic - home allopurinol    DVT prophylaxis: lovenox  Code Status: dnr/dni Family Communication: son Franky updated telephonically 9/15  Level of care: Telemetry Medical Status is: Inpatient Remains inpatient appropriate because: ongoing inpatient w/u    Consultants:  GI  Procedures: None thus far  Antimicrobials:  none    Subjective: Reports ongoing epigastric pain, no vomiting  Objective: Vitals:   05/27/24 0000 05/27/24 0403 05/27/24 0720 05/27/24 1156  BP: (!) 99/56 (!) 126/54 (!) 136/59 (!) 132/59  Pulse: 60 61 65 67  Resp: 16 18 16 18   Temp: 98.4 F (36.9 C) 97.9 F (36.6 C) 98.1 F (36.7 C) 97.6 F (36.4 C)  TempSrc: Oral Oral    SpO2: 99% 100% 100% 100%  Weight:      Height:        Intake/Output Summary (Last 24 hours) at 05/27/2024 1458 Last data filed at 05/27/2024 1405 Gross per 24 hour  Intake 900 ml  Output 1 ml  Net 899 ml   Filed Weights   05/25/24 2342  Weight: 70.3 kg    Examination:  General exam: Appears calm and comfortable  Respiratory system: Clear to auscultation. Respiratory effort normal. Cardiovascular system: S1 & S2 heard, RRR.  Gastrointestinal system: Abdomen is nondistended, soft and nontender.   Central nervous system: Alert and oriented. No focal neurological deficits. Extremities: Symmetric 5 x 5 power. Skin: No rashes, lesions or ulcers Psychiatry: Judgement and insight appear normal. Mood & affect appropriate.     Data Reviewed: I have personally reviewed following labs and imaging studies  CBC: Recent Labs  Lab 05/25/24 1734 05/26/24 0419 05/27/24 0319  WBC 7.2 3.7* 3.4*  NEUTROABS 6.2  --   --   HGB 14.1  10.7* 11.7*  HCT 44.0 32.7* 35.9*  MCV 101.6* 100.0 100.6*  PLT 98* 115* 129*   Basic Metabolic Panel: Recent Labs  Lab 05/25/24 1734 05/26/24 0419 05/27/24 0319  NA 137 138 142  K 4.8 4.3 4.0  CL 103 107 109  CO2 21* 23 25  GLUCOSE 337* 253* 57*  BUN 57* 46* 36*  CREATININE 1.63* 1.37* 1.22*  CALCIUM  8.8* 8.0* 8.5*  MG 1.9  --   --    GFR: Estimated Creatinine Clearance: 32.9 mL/min (A) (by C-G formula based on SCr of 1.22 mg/dL (H)). Liver Function Tests: Recent Labs  Lab 05/25/24 1734 05/26/24 0419 05/27/24 0319  AST 106* 87* 80*  ALT 73* 64* 69*  ALKPHOS 134* 113 129*  BILITOT 1.8* 1.2 0.8  PROT 7.8 5.9* 6.7  ALBUMIN  4.0 3.0* 3.5   Recent Labs  Lab 05/25/24 1734  LIPASE 30   No results for input(s): AMMONIA in the last 168 hours. Coagulation Profile: No results for input(s): INR, PROTIME in the last 168 hours. Cardiac Enzymes: No results for input(s): CKTOTAL, CKMB, CKMBINDEX, TROPONINI in the last 168 hours. BNP (last 3 results) No results for input(s): PROBNP in the last 8760 hours. HbA1C: Recent Labs    05/26/24 0419  HGBA1C 8.0*   CBG: Recent Labs  Lab 05/26/24 1628 05/26/24 2047 05/27/24 0724 05/27/24 0819 05/27/24 1154  GLUCAP 207* 92 51* 79 143*   Lipid Profile: No results for input(s): CHOL, HDL, LDLCALC, TRIG, CHOLHDL, LDLDIRECT in the last 72 hours. Thyroid  Function Tests: No results for input(s): TSH, T4TOTAL, FREET4, T3FREE, THYROIDAB in the last 72 hours. Anemia Panel: No results for input(s): VITAMINB12, FOLATE, FERRITIN, TIBC, IRON , RETICCTPCT in the last 72 hours. Urine analysis:    Component Value Date/Time   COLORURINE YELLOW (A) 05/14/2024 1056   APPEARANCEUR CLEAR (A) 05/14/2024 1056   APPEARANCEUR Hazy 10/29/2012 1534   LABSPEC 1.009 05/14/2024 1056   LABSPEC 1.010 10/29/2012 1534   PHURINE 5.0 05/14/2024 1056   GLUCOSEU NEGATIVE 05/14/2024 1056   GLUCOSEU  Negative 10/29/2012 1534   HGBUR NEGATIVE 05/14/2024 1056   BILIRUBINUR NEGATIVE 05/14/2024 1056   BILIRUBINUR Negative 10/29/2012 1534   KETONESUR NEGATIVE 05/14/2024 1056   PROTEINUR NEGATIVE 05/14/2024 1056   NITRITE NEGATIVE 05/14/2024 1056   LEUKOCYTESUR NEGATIVE 05/14/2024 1056   LEUKOCYTESUR Negative 10/29/2012 1534   Sepsis Labs: @LABRCNTIP (procalcitonin:4,lacticidven:4)  ) Recent Results (from the past 240 hours)  Resp panel by RT-PCR (RSV, Flu A&B, Covid) Anterior Nasal Swab     Status: None   Collection Time: 05/25/24  5:35 PM   Specimen: Anterior Nasal Swab  Result Value Ref Range Status   SARS Coronavirus 2 by RT PCR NEGATIVE NEGATIVE Final    Comment: (NOTE) SARS-CoV-2 target nucleic acids are NOT DETECTED.  The SARS-CoV-2 RNA is generally detectable in upper respiratory specimens during the acute phase of infection. The lowest concentration of SARS-CoV-2 viral copies this assay can detect is 138 copies/mL. A negative result does  not preclude SARS-Cov-2 infection and should not be used as the sole basis for treatment or other patient management decisions. A negative result may occur with  improper specimen collection/handling, submission of specimen other than nasopharyngeal swab, presence of viral mutation(s) within the areas targeted by this assay, and inadequate number of viral copies(<138 copies/mL). A negative result must be combined with clinical observations, patient history, and epidemiological information. The expected result is Negative.  Fact Sheet for Patients:  BloggerCourse.com  Fact Sheet for Healthcare Providers:  SeriousBroker.it  This test is no t yet approved or cleared by the [place]  FDA and  has been authorized for detection and/or diagnosis of SARS-CoV-2 by FDA under an Emergency Use Authorization (EUA). This EUA will remain  in effect (meaning this test can be used) for the  duration of the COVID-19 declaration under Section 564(b)(1) of the Act, 21 U.S.C.section 360bbb-3(b)(1), unless the authorization is terminated  or revoked sooner.       Influenza A by PCR NEGATIVE NEGATIVE Final   Influenza B by PCR NEGATIVE NEGATIVE Final    Comment: (NOTE) The Xpert Xpress SARS-CoV-2/FLU/RSV plus assay is intended as an aid in the diagnosis of influenza from Nasopharyngeal swab specimens and should not be used as a sole basis for treatment. Nasal washings and aspirates are unacceptable for Xpert Xpress SARS-CoV-2/FLU/RSV testing.  Fact Sheet for Patients: BloggerCourse.com  Fact Sheet for Healthcare Providers: SeriousBroker.it  This test is not yet approved or cleared by the [place]  FDA and has been authorized for detection and/or diagnosis of SARS-CoV-2 by FDA under an Emergency Use Authorization (EUA). This EUA will remain in effect (meaning this test can be used) for the duration of the COVID-19 declaration under Section 564(b)(1) of the Act, 21 U.S.C. section 360bbb-3(b)(1), unless the authorization is terminated or revoked.     Resp Syncytial Virus by PCR NEGATIVE NEGATIVE Final    Comment: (NOTE) Fact Sheet for Patients: BloggerCourse.com  Fact Sheet for Healthcare Providers: SeriousBroker.it  This test is not yet approved or cleared by the [place]  FDA and has been authorized for detection and/or diagnosis of SARS-CoV-2 by FDA under an Emergency Use Authorization (EUA). This EUA will remain in effect (meaning this test can be used) for the duration of the COVID-19 declaration under Section 564(b)(1) of the Act, 21 U.S.C. section 360bbb-3(b)(1), unless the authorization is terminated or revoked.  Performed at [facility], [address] [place], [place] [address]          Radiology Studies: DG ESOPHAGUS W SINGLE CM  (SOL OR THIN BA) Result Date: 05/27/2024 CLINICAL DATA:  83 year old female currently admitted due to intractable nausea and vomiting. Patient reports tablets and solid foods getting stuck in her mid chest for the past 6 months. She reports intermittent episodes of regurgitation prior to this admission. Denies any difficulties with liquids. EXAM: ESOPHAGUS/BARIUM SWALLOW/TABLET STUDY TECHNIQUE: Single contrast examination was performed using thin liquid barium. This exam was performed by [facility] PA-C, and was supervised and interpreted by Dr. Harrietta Sherry. FLUOROSCOPY: Radiation Exposure Index (as provided by the fluoroscopic device): 37.8 mGy Kerma COMPARISON:  CT A/P WO CONTRAST - 05/14/24. FINDINGS: Swallowing: Appears normal. No vestibular penetration or aspiration seen. Pharynx: Unremarkable. Esophagus: Normal appearance. No obvious mucosal abnormality or visible ulceration. Esophageal motility: Mild esophageal dysmotility leading to delayed passage of contrast from the esophagus into the stomach. Intermittent tertiary contractions and proximal escape. Hiatal Hernia: Possible small hiatal hernia, although poorly visualized on exam due  to patient movement. Gastroesophageal reflux: None visualized. Ingested 13mm barium tablet: Tablet initially stuck in the distal esophagus, but subsequently passed with additional water  intake. Other: Limited exam due to patient mobility. Entirety of exam performed at approximately 25 degrees table tilt. IMPRESSION: 1. Mild esophageal dysmotility. 2. Possible small hiatal hernia, poorly visualized during this exam. Electronically Signed   By: Harrietta Sherry M.D.   On: 05/27/2024 12:20   US  ABDOMEN LIMITED RUQ (LIVER/GB) Result Date: 05/25/2024 CLINICAL DATA:  Nausea and von vomiting, right upper quadrant pain, transaminitis EXAM: ULTRASOUND ABDOMEN LIMITED RIGHT UPPER QUADRANT COMPARISON:  05/25/2024 FINDINGS: Gallbladder: Surgically absent. Common bile duct:  Diameter: 10 mm Liver: No focal lesion identified. Within normal limits in parenchymal echogenicity. Portal vein is patent on color Doppler imaging with normal direction of blood flow towards the liver. Other: None. IMPRESSION: 1. Unremarkable right upper quadrant ultrasound in a patient status post cholecystectomy. Electronically Signed   By: Ozell Daring M.D.   On: 05/25/2024 21:31   CT ABDOMEN PELVIS WO CONTRAST Result Date: 05/25/2024 EXAM: CT ABDOMEN AND PELVIS WITHOUT CONTRAST 05/25/2024 06:55:38 PM TECHNIQUE: CT of the abdomen and pelvis was performed without the administration of intravenous contrast. Multiplanar reformatted images are provided for review. Automated exposure control, iterative reconstruction, and/or weight-based adjustment of the mA/kV was utilized to reduce the radiation dose to as low as reasonably achievable. COMPARISON: None available. CLINICAL HISTORY: Eval appy. RLQ TTP. N/V, recently diagnosed hiatal hernia. RLQ pain. FINDINGS: LOWER CHEST: Patient leads noted within the right heart. Cardiac size within normal limits. Visualized lung bases are clear. LIVER: Status post cholecystectomy. GALLBLADDER AND BILE DUCTS: Status post cholecystectomy. SPLEEN: No acute abnormality. PANCREAS: No acute abnormality. ADRENAL GLANDS: No acute abnormality. KIDNEYS, URETERS AND BLADDER: No stones in the kidneys or ureters. No hydronephrosis. No perinephric or periureteral stranding. Urinary bladder is unremarkable. GI AND BOWEL: Status post distal colectomy with colorectal anastomotic staple line noted. Appendix is not clearly identified, however, there are no secondary signs of appendicitis within the right lower quadrant. Moderate colon stool burden. Stomach, small bowel, and large bowel are otherwise unremarkable. No free intraperitoneal gas or fluid. PERITONEUM AND RETROPERITONEUM: No free intraperitoneal gas or fluid. VASCULATURE: Moderate aortoiliac atherosclerotic calcification. LYMPH  NODES: Surgical clips within the pelvis may relate to prior pelvic lymph node dissection. REPRODUCTIVE ORGANS: Status post hysterectomy. No adnexal masses are seen. BONES AND SOFT TISSUES: Status post right hip ORIF. Status post L4-S1 lumbar fusion with instrumentation. Remote severe anterior wedge compression deformities of T12 and L1 are identified, status post vertebroplasty with residual accentuated thoracolumbar kyphosis. Mild retropulsion of the posterosuperior aspect of the T12 vertebral body results in moderate central canal stenosis. No acute bone abnormality. No lytic or blastic bone lesion. IMPRESSION: 1. No CT evidence of appendicitis. Appendix not clearly identified. 2. Moderate colon stool burden. Electronically signed by: Dorethia Molt MD 05/25/2024 07:23 PM EDT RP Workstation: [id number]   DG Chest Portable 1 View Result Date: 05/25/2024 CLINICAL DATA:  SOB, hypoxic. hx PPM, ILD, dCHF EXAM: PORTABLE CHEST 1 VIEW COMPARISON:  Chest x-ray 10/28/2023, CT chest 05/14/2024 FINDINGS: Left chest wall dual lead pacemaker. \ The heart and mediastinal contours are unchanged. Atherosclerotic plaque. Prominent hilar vasculature. No focal consolidation. No pulmonary edema. No pleural effusion. No pneumothorax. No acute osseous abnormality. Partially visualized intramedullary nail fixation of the left humerus. IMPRESSION: 1. Pulmonary venous congestion.  No overt edema. 2.  Aortic Atherosclerosis (ICD10-I70.0). Electronically Signed   By: Morgane  Naveau M.D.  On: 05/25/2024 17:53        Scheduled Meds:  allopurinol   100 mg Oral q AM   amiodarone   100 mg Oral Daily   cholecalciferol   2,000 Units Oral q AM   [START ON 05/28/2024] enoxaparin  (LOVENOX ) injection  40 mg Subcutaneous Q24H   feeding supplement  1 Container Oral TID BM   fesoterodine   4 mg Oral Daily   furosemide   20 mg Oral Daily   gabapentin   600 mg Oral TID   insulin  aspart  0-15 Units Subcutaneous TID WC   insulin  aspart  0-5  Units Subcutaneous QHS   iron  polysaccharides  150 mg Oral Daily   levothyroxine   37.5 mcg Oral Q0600   loratadine   10 mg Oral Daily   multivitamin with minerals  1 tablet Oral Daily   pantoprazole  (PROTONIX ) IV  40 mg Intravenous Q12H   polyethylene glycol  17 g Oral Daily   rOPINIRole   0.5 mg Oral QHS   sertraline   100 mg Oral q AM   sucralfate   1 g Oral QID   thiamine   100 mg Oral Daily   Continuous Infusions:     LOS: 2 days     Devaughn KATHEE Ban, MD [facility]   If 7PM-7AM, please contact night-coverage [email or web address] Password [facility] 05/27/2024, 2:58 PM

## 2024-05-27 NOTE — Progress Notes (Signed)
 Rogelia Copping, MD [facility]   [address], [address] [place], [place] [address] Phone: [phone] Fax : [phone]   Subjective: The patient states that she has not had any more vomiting or diarrhea but continues to have nausea.  She also continues to have abdominal pain.  I spent an extensive amount of time talking to her and her son about the causes of her nausea and the results of her barium swallow that showed poor esophageal motility and they could not rule out a hiatal hernia although hiatal hernia was not mentioned on the CT scan recently done or my endoscopy done in April   Objective: Vital signs in last 24 hours: Vitals:   05/27/24 0403 05/27/24 0720 05/27/24 1156 05/27/24 1551  BP: (!) 126/54 (!) 136/59 (!) 132/59 (!) 138/59  Pulse: 61 65 67 61  Resp: 18 16 18 18   Temp: 97.9 F (36.6 C) 98.1 F (36.7 C) 97.6 F (36.4 C) 98.7 F (37.1 C)  TempSrc: Oral     SpO2: 100% 100% 100% 99%  Weight:      Height:       Weight change:   Intake/Output Summary (Last 24 hours) at 05/27/2024 1552 Last data filed at 05/27/2024 1405 Gross per 24 hour  Intake 900 ml  Output 1 ml  Net 899 ml     Exam: Abdomen: Soft positive Carnett's sign with mid abdominal tenderness reproducible while flexing the abdominal wall muscles   Lab Results: @LABTEST2 @ Micro Results: Recent Results (from the past 240 hours)  Resp panel by RT-PCR (RSV, Flu A&B, Covid) Anterior Nasal Swab     Status: None   Collection Time: 05/25/24  5:35 PM   Specimen: Anterior Nasal Swab  Result Value Ref Range Status   SARS Coronavirus 2 by RT PCR NEGATIVE NEGATIVE Final    Comment: (NOTE) SARS-CoV-2 target nucleic acids are NOT DETECTED.  The SARS-CoV-2 RNA is generally detectable in upper respiratory specimens during the acute phase of infection. The lowest concentration of SARS-CoV-2 viral copies this assay can detect is 138 copies/mL. A negative result does not preclude SARS-Cov-2 infection and should  not be used as the sole basis for treatment or other patient management decisions. A negative result may occur with  improper specimen collection/handling, submission of specimen other than nasopharyngeal swab, presence of viral mutation(s) within the areas targeted by this assay, and inadequate number of viral copies(<138 copies/mL). A negative result must be combined with clinical observations, patient history, and epidemiological information. The expected result is Negative.  Fact Sheet for Patients:  BloggerCourse.com  Fact Sheet for Healthcare Providers:  SeriousBroker.it  This test is no t yet approved or cleared by the [place]  FDA and  has been authorized for detection and/or diagnosis of SARS-CoV-2 by FDA under an Emergency Use Authorization (EUA). This EUA will remain  in effect (meaning this test can be used) for the duration of the COVID-19 declaration under Section 564(b)(1) of the Act, 21 U.S.C.section 360bbb-3(b)(1), unless the authorization is terminated  or revoked sooner.       Influenza A by PCR NEGATIVE NEGATIVE Final   Influenza B by PCR NEGATIVE NEGATIVE Final    Comment: (NOTE) The Xpert Xpress SARS-CoV-2/FLU/RSV plus assay is intended as an aid in the diagnosis of influenza from Nasopharyngeal swab specimens and should not be used as a sole basis for treatment. Nasal washings and aspirates are unacceptable for Xpert Xpress SARS-CoV-2/FLU/RSV testing.  Fact Sheet for Patients: BloggerCourse.com  Fact Sheet  for Healthcare Providers: SeriousBroker.it  This test is not yet approved or cleared by the [place]  FDA and has been authorized for detection and/or diagnosis of SARS-CoV-2 by FDA under an Emergency Use Authorization (EUA). This EUA will remain in effect (meaning this test can be used) for the duration of the COVID-19 declaration under  Section 564(b)(1) of the Act, 21 U.S.C. section 360bbb-3(b)(1), unless the authorization is terminated or revoked.     Resp Syncytial Virus by PCR NEGATIVE NEGATIVE Final    Comment: (NOTE) Fact Sheet for Patients: BloggerCourse.com  Fact Sheet for Healthcare Providers: SeriousBroker.it  This test is not yet approved or cleared by the [place]  FDA and has been authorized for detection and/or diagnosis of SARS-CoV-2 by FDA under an Emergency Use Authorization (EUA). This EUA will remain in effect (meaning this test can be used) for the duration of the COVID-19 declaration under Section 564(b)(1) of the Act, 21 U.S.C. section 360bbb-3(b)(1), unless the authorization is terminated or revoked.  Performed at [facility], [address] [place], [place] [address]    Studies/Results: DG ESOPHAGUS W SINGLE CM (SOL OR THIN BA) Result Date: 05/27/2024 CLINICAL DATA:  83 year old female currently admitted due to intractable nausea and vomiting. Patient reports tablets and solid foods getting stuck in her mid chest for the past 6 months. She reports intermittent episodes of regurgitation prior to this admission. Denies any difficulties with liquids. EXAM: ESOPHAGUS/BARIUM SWALLOW/TABLET STUDY TECHNIQUE: Single contrast examination was performed using thin liquid barium. This exam was performed by [facility] PA-C, and was supervised and interpreted by Dr. Harrietta Sherry. FLUOROSCOPY: Radiation Exposure Index (as provided by the fluoroscopic device): 37.8 mGy Kerma COMPARISON:  CT A/P WO CONTRAST - 05/14/24. FINDINGS: Swallowing: Appears normal. No vestibular penetration or aspiration seen. Pharynx: Unremarkable. Esophagus: Normal appearance. No obvious mucosal abnormality or visible ulceration. Esophageal motility: Mild esophageal dysmotility leading to delayed passage of contrast from the esophagus into the stomach. Intermittent  tertiary contractions and proximal escape. Hiatal Hernia: Possible small hiatal hernia, although poorly visualized on exam due to patient movement. Gastroesophageal reflux: None visualized. Ingested 13mm barium tablet: Tablet initially stuck in the distal esophagus, but subsequently passed with additional water  intake. Other: Limited exam due to patient mobility. Entirety of exam performed at approximately 25 degrees table tilt. IMPRESSION: 1. Mild esophageal dysmotility. 2. Possible small hiatal hernia, poorly visualized during this exam. Electronically Signed   By: Harrietta Sherry M.D.   On: 05/27/2024 12:20   US  ABDOMEN LIMITED RUQ (LIVER/GB) Result Date: 05/25/2024 CLINICAL DATA:  Nausea and von vomiting, right upper quadrant pain, transaminitis EXAM: ULTRASOUND ABDOMEN LIMITED RIGHT UPPER QUADRANT COMPARISON:  05/25/2024 FINDINGS: Gallbladder: Surgically absent. Common bile duct: Diameter: 10 mm Liver: No focal lesion identified. Within normal limits in parenchymal echogenicity. Portal vein is patent on color Doppler imaging with normal direction of blood flow towards the liver. Other: None. IMPRESSION: 1. Unremarkable right upper quadrant ultrasound in a patient status post cholecystectomy. Electronically Signed   By: Ozell Daring M.D.   On: 05/25/2024 21:31   CT ABDOMEN PELVIS WO CONTRAST Result Date: 05/25/2024 EXAM: CT ABDOMEN AND PELVIS WITHOUT CONTRAST 05/25/2024 06:55:38 PM TECHNIQUE: CT of the abdomen and pelvis was performed without the administration of intravenous contrast. Multiplanar reformatted images are provided for review. Automated exposure control, iterative reconstruction, and/or weight-based adjustment of the mA/kV was utilized to reduce the radiation dose to as low as reasonably achievable. COMPARISON: None available. CLINICAL HISTORY: Eval appy. RLQ TTP. N/V,  recently diagnosed hiatal hernia. RLQ pain. FINDINGS: LOWER CHEST: Patient leads noted within the right heart. Cardiac  size within normal limits. Visualized lung bases are clear. LIVER: Status post cholecystectomy. GALLBLADDER AND BILE DUCTS: Status post cholecystectomy. SPLEEN: No acute abnormality. PANCREAS: No acute abnormality. ADRENAL GLANDS: No acute abnormality. KIDNEYS, URETERS AND BLADDER: No stones in the kidneys or ureters. No hydronephrosis. No perinephric or periureteral stranding. Urinary bladder is unremarkable. GI AND BOWEL: Status post distal colectomy with colorectal anastomotic staple line noted. Appendix is not clearly identified, however, there are no secondary signs of appendicitis within the right lower quadrant. Moderate colon stool burden. Stomach, small bowel, and large bowel are otherwise unremarkable. No free intraperitoneal gas or fluid. PERITONEUM AND RETROPERITONEUM: No free intraperitoneal gas or fluid. VASCULATURE: Moderate aortoiliac atherosclerotic calcification. LYMPH NODES: Surgical clips within the pelvis may relate to prior pelvic lymph node dissection. REPRODUCTIVE ORGANS: Status post hysterectomy. No adnexal masses are seen. BONES AND SOFT TISSUES: Status post right hip ORIF. Status post L4-S1 lumbar fusion with instrumentation. Remote severe anterior wedge compression deformities of T12 and L1 are identified, status post vertebroplasty with residual accentuated thoracolumbar kyphosis. Mild retropulsion of the posterosuperior aspect of the T12 vertebral body results in moderate central canal stenosis. No acute bone abnormality. No lytic or blastic bone lesion. IMPRESSION: 1. No CT evidence of appendicitis. Appendix not clearly identified. 2. Moderate colon stool burden. Electronically signed by: Dorethia Molt MD 05/25/2024 07:23 PM EDT RP Workstation: [id number]   DG Chest Portable 1 View Result Date: 05/25/2024 CLINICAL DATA:  SOB, hypoxic. hx PPM, ILD, dCHF EXAM: PORTABLE CHEST 1 VIEW COMPARISON:  Chest x-ray 10/28/2023, CT chest 05/14/2024 FINDINGS: Left chest wall dual lead pacemaker.  \ The heart and mediastinal contours are unchanged. Atherosclerotic plaque. Prominent hilar vasculature. No focal consolidation. No pulmonary edema. No pleural effusion. No pneumothorax. No acute osseous abnormality. Partially visualized intramedullary nail fixation of the left humerus. IMPRESSION: 1. Pulmonary venous congestion.  No overt edema. 2.  Aortic Atherosclerosis (ICD10-I70.0). Electronically Signed   By: Morgane  Naveau M.D.   On: 05/25/2024 17:53   Medications: I have reviewed the patient's current medications. Scheduled Meds:  allopurinol   100 mg Oral q AM   amiodarone   100 mg Oral Daily   cholecalciferol   2,000 Units Oral q AM   [START ON 05/28/2024] enoxaparin  (LOVENOX ) injection  40 mg Subcutaneous Q24H   feeding supplement  1 Container Oral TID BM   fesoterodine   4 mg Oral Daily   furosemide   20 mg Oral Daily   gabapentin   600 mg Oral TID   insulin  aspart  0-5 Units Subcutaneous QHS   insulin  aspart  0-9 Units Subcutaneous TID WC   iron  polysaccharides  150 mg Oral Daily   levothyroxine   37.5 mcg Oral Q0600   loratadine   10 mg Oral Daily   multivitamin with minerals  1 tablet Oral Daily   pantoprazole  (PROTONIX ) IV  40 mg Intravenous Q12H   polyethylene glycol  17 g Oral Daily   rOPINIRole   0.5 mg Oral QHS   sertraline   100 mg Oral q AM   sucralfate   1 g Oral QID   thiamine   100 mg Oral Daily   Continuous Infusions: PRN Meds:.acetaminophen  **OR** acetaminophen , albuterol , alum & mag hydroxide-simeth, HYDROmorphone  (DILAUDID ) injection, magnesium  hydroxide, ondansetron  **OR** ondansetron  (ZOFRAN ) IV, senna, traMADol , traZODone    Assessment: Principal Problem:   Intractable nausea and vomiting Active Problems:   Gout   Cardiac pacemaker in situ   CAD (  coronary artery disease)   Hypothyroidism   Paroxysmal atrial fibrillation (HCC)   Elevated LFTs   Depression   Type 2 diabetes mellitus without complications (HCC)   CKD stage 3b, GFR 30-44 ml/min  (HCC)    Plan: The patient has abdominal pain that is consistent with musculoskeletal pain likely from her vomiting.  The vomiting has stopped and her diarrhea has also stopped.  The patient's liver enzymes have been elevated all the way as far back as 4 months ago and before.  She should follow-up with her primary gastroenterologist about this.  As far as the patient's nausea the patient should be treated with a PPI and it may be related to her irritable bowel syndrome and she states she is under a lot of stress with her husband having Alzheimer's.  We have discussed the possibility of doing an upper endoscopy and both agree that that would not be helpful at this time.  I would consider possible muscle relaxer for her abdominal wall pain.  She should also remain on a PPI.   LOS: 2 days   Rogelia Copping, MD.FACG 05/27/2024, 3:52 PM Pager [phone] 7am-5pm  Check AMION for 5pm -7am coverage and on weekends

## 2024-05-27 NOTE — Progress Notes (Signed)
 Pts morning CBG was 51. Spoke with Dr. Kandis and he ordered Glucagon  IV. Administered per orders and rechecked after admin and pt CBG was 79.

## 2024-05-28 DIAGNOSIS — R112 Nausea with vomiting, unspecified: Secondary | ICD-10-CM | POA: Diagnosis not present

## 2024-05-28 LAB — CBC
HCT: 36.5 % (ref 36.0–46.0)
Hemoglobin: 12 g/dL (ref 12.0–15.0)
MCH: 32.9 pg (ref 26.0–34.0)
MCHC: 32.9 g/dL (ref 30.0–36.0)
MCV: 100 fL (ref 80.0–100.0)
Platelets: 141 K/uL — ABNORMAL LOW (ref 150–400)
RBC: 3.65 MIL/uL — ABNORMAL LOW (ref 3.87–5.11)
RDW: 12.3 % (ref 11.5–15.5)
WBC: 4.3 K/uL (ref 4.0–10.5)
nRBC: 0 % (ref 0.0–0.2)

## 2024-05-28 LAB — COMPREHENSIVE METABOLIC PANEL WITH GFR
ALT: 62 U/L — ABNORMAL HIGH (ref 0–44)
AST: 71 U/L — ABNORMAL HIGH (ref 15–41)
Albumin: 3.4 g/dL — ABNORMAL LOW (ref 3.5–5.0)
Alkaline Phosphatase: 133 U/L — ABNORMAL HIGH (ref 38–126)
Anion gap: 8 (ref 5–15)
BUN: 27 mg/dL — ABNORMAL HIGH (ref 8–23)
CO2: 28 mmol/L (ref 22–32)
Calcium: 8.7 mg/dL — ABNORMAL LOW (ref 8.9–10.3)
Chloride: 103 mmol/L (ref 98–111)
Creatinine, Ser: 1.21 mg/dL — ABNORMAL HIGH (ref 0.44–1.00)
GFR, Estimated: 44 mL/min — ABNORMAL LOW (ref >60)
Glucose, Bld: 150 mg/dL — ABNORMAL HIGH (ref 70–99)
Potassium: 4.2 mmol/L (ref 3.5–5.1)
Sodium: 139 mmol/L (ref 135–145)
Total Bilirubin: 0.9 mg/dL (ref 0.0–1.2)
Total Protein: 6.6 g/dL (ref 6.5–8.1)

## 2024-05-28 LAB — GLUCOSE, CAPILLARY
Glucose-Capillary: 156 mg/dL — ABNORMAL HIGH (ref 70–99)
Glucose-Capillary: 237 mg/dL — ABNORMAL HIGH (ref 70–99)
Glucose-Capillary: 278 mg/dL — ABNORMAL HIGH (ref 70–99)
Glucose-Capillary: 58 mg/dL — ABNORMAL LOW (ref 70–99)

## 2024-05-28 MED ORDER — DICYCLOMINE HCL 10 MG PO CAPS
10.0000 mg | ORAL_CAPSULE | Freq: Three times a day (TID) | ORAL | Status: DC
  Administered 2024-05-28 – 2024-05-29 (×2): 10 mg via ORAL
  Filled 2024-05-28 (×2): qty 1

## 2024-05-28 NOTE — Care Management Important Message (Signed)
 Important Message  Patient Details  Name: Deborah Obrien MRN: [id number] Date of Birth: December 15, 1940   Important Message Given:  Yes - Medicare IM     Jamel Dunton W, CMA 05/28/2024, 10:03 AM

## 2024-05-28 NOTE — Progress Notes (Signed)
 PROGRESS NOTE    PAMI WOOL  FMW:[id number] DOB: 1941-07-19 DOA: 05/25/2024 PCP: Cordella Corning, FNP     Brief Narrative:   From admission h and p  Deborah Obrien is a 83 y.o. Caucasian homeless female with medical history significant for anxiety, carotid artery stenosis, chronic heart failure with preserved EF, coronary artery disease, hypertension, and hypothyroidism, who presented to the emergency room with acute onset of dyspnea initially as well as recurrent nausea and vomiting with epigastric abdominal pain over the last 12 hours.  She was not dyspneic during my interview.  Despite given Zofran  at her SNF she continued to have recurrent vomiting.  No fever or chills.  No chest pain or palpitations.  No cough or wheezing or dyspnea.  No dysuria, oliguria or hematuria or flank pain.     Assessment & Plan:   Principal Problem:   Intractable nausea and vomiting Active Problems:   Elevated LFTs   Hypothyroidism   Paroxysmal atrial fibrillation (HCC)   Type 2 diabetes mellitus without complications (HCC)   CAD (coronary artery disease)   Gout   Cardiac pacemaker in situ   Depression   CKD stage 3b, GFR 30-44 ml/min (HCC)  # Epigastric abdominal pain # Nausea/vomiting One day acute onset of these symptoms. Emesis nbnb. CT abdomen unremarkable, though hiatal hernia seen on CT a week ago. No sig pathology seen on EGD earlier this year. Last apixaban  AM of 9/14. No report of bleeding. Does have mid stable transaminitis that is chronic, mildly elevated t bili that has normalized, is s/p cholecystectomy and ct and u/s do not show ductal dilation, doubt therefore obstructive process. Esophogram shows mild esophageal dysmotility, possible small hiatal hernia. GI evaluated on 9/15 and symptoms thought to be msk, EGD deferred. Overnight and this morning patient reports severe abdominal pain with eating, also dysphagia - feeling food gets stuck in throat and then comes right back up.   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1 C) 98.4 F (36.9 C) 98.6 F (37 C)  TempSrc: Oral Oral Oral Oral  SpO2: 99% 97% 100% 98%  Weight:      Height:        Intake/Output Summary (Last 24 hours) at 05/28/2024 1038 Last data filed at 05/27/2024 1941 Gross per 24 hour  Intake 780 ml  Output --  Net 780 ml   Filed Weights   05/25/24 2342  Weight: 70.3 kg    Examination:  General exam: Appears calm and comfortable  Respiratory system: Clear to auscultation. Respiratory effort normal. Cardiovascular system: S1 & S2 heard, RRR.   Gastrointestinal system: Abdomen is nondistended, soft and nontender.   Central nervous system: Alert and oriented. No focal neurological deficits. Extremities: Symmetric 5 x 5 power. Skin: No rashes, lesions or ulcers Psychiatry: Judgement and insight appear normal. Mood & affect appropriate.     Data Reviewed: I have personally reviewed following  labs and imaging studies  CBC: Recent Labs  Lab 05/25/24 1734 05/26/24 0419 05/27/24 0319 05/28/24 0352  WBC 7.2 3.7* 3.4* 4.3  NEUTROABS 6.2  --   --   --   HGB 14.1 10.7* 11.7* 12.0  HCT 44.0 32.7* 35.9* 36.5  MCV 101.6* 100.0 100.6* 100.0  PLT 98* 115* 129* 141*   Basic Metabolic Panel: Recent Labs  Lab 05/25/24 1734 05/26/24 0419 05/27/24 0319 05/28/24 0352  NA 137 138 142 139  K 4.8 4.3 4.0 4.2  CL 103 107 109 103  CO2 21* 23 25 28   GLUCOSE 337* 253* 57* 150*  BUN 57* 46* 36* 27*  CREATININE 1.63* 1.37* 1.22* 1.21*  CALCIUM  8.8* 8.0* 8.5* 8.7*  MG 1.9  --   --   --    GFR: Estimated Creatinine Clearance: 33.1 mL/min (A) (by C-G formula based on SCr of 1.21 mg/dL (H)). Liver Function Tests: Recent Labs  Lab 05/25/24 1734 05/26/24 0419 05/27/24 0319 05/28/24 0352  AST 106* 87* 80* 71*  ALT 73* 64* 69* 62*  ALKPHOS 134* 113 129* 133*  BILITOT 1.8* 1.2 0.8 0.9  PROT 7.8 5.9* 6.7 6.6  ALBUMIN  4.0 3.0* 3.5 3.4*   Recent Labs  Lab 05/25/24 1734  LIPASE 30   No results for input(s): AMMONIA in the last 168 hours. Coagulation Profile: No results for input(s): INR, PROTIME in the last 168 hours. Cardiac Enzymes: No results for input(s): CKTOTAL, CKMB, CKMBINDEX, TROPONINI in the last 168 hours. BNP (last 3 results) No results for input(s): PROBNP in the last 8760 hours. HbA1C: Recent Labs    05/26/24 0419  HGBA1C 8.0*   CBG: Recent Labs  Lab 05/27/24 0819 05/27/24 1154 05/27/24 1539 05/27/24 2221 05/28/24 0800  GLUCAP 79 143* 212* 73 156*   Lipid Profile: No results for input(s): CHOL, HDL, LDLCALC, TRIG, CHOLHDL, LDLDIRECT in the last 72 hours. Thyroid  Function Tests: No results for input(s): TSH, T4TOTAL, FREET4, T3FREE, THYROIDAB in the last 72 hours. Anemia Panel: No results for input(s): VITAMINB12, FOLATE, FERRITIN, TIBC, IRON , RETICCTPCT in the last 72 hours. Urine analysis:     Component Value Date/Time   COLORURINE YELLOW (A) 05/14/2024 1056   APPEARANCEUR CLEAR (A) 05/14/2024 1056   APPEARANCEUR Hazy 10/29/2012 1534   LABSPEC 1.009 05/14/2024 1056   LABSPEC 1.010 10/29/2012 1534   PHURINE 5.0 05/14/2024 1056   GLUCOSEU NEGATIVE 05/14/2024 1056   GLUCOSEU Negative 10/29/2012 1534   HGBUR NEGATIVE 05/14/2024 1056   BILIRUBINUR NEGATIVE 05/14/2024 1056   BILIRUBINUR Negative 10/29/2012 1534   KETONESUR NEGATIVE 05/14/2024 1056   PROTEINUR NEGATIVE 05/14/2024 1056   NITRITE NEGATIVE 05/14/2024 1056   LEUKOCYTESUR NEGATIVE 05/14/2024 1056   LEUKOCYTESUR Negative 10/29/2012 1534   Sepsis Labs: @LABRCNTIP (procalcitonin:4,lacticidven:4)  ) Recent Results (from the past 240 hours)  Resp panel by RT-PCR (RSV, Flu A&B, Covid) Anterior Nasal Swab     Status: None   Collection Time: 05/25/24  5:35 PM   Specimen: Anterior Nasal Swab  Result Value Ref Range Status   SARS  Coronavirus 2 by RT PCR NEGATIVE NEGATIVE Final    Comment: (NOTE) SARS-CoV-2 target nucleic acids are NOT DETECTED.  The SARS-CoV-2 RNA is generally detectable in upper respiratory specimens during the acute phase of infection. The lowest concentration of SARS-CoV-2 viral copies this assay can detect is 138 copies/mL. A negative result does not preclude SARS-Cov-2 infection and should not be used as the sole basis for treatment or other patient management decisions. A negative result may occur with  improper specimen collection/handling, submission of specimen other than nasopharyngeal swab, presence of viral mutation(s) within the areas targeted by this assay, and inadequate number of viral copies(<138 copies/mL). A negative result must be combined with clinical observations, patient history, and epidemiological information. The expected result is Negative.  Fact Sheet for Patients:  BloggerCourse.com  Fact Sheet for Healthcare Providers:   SeriousBroker.it  This test is no t yet approved or cleared by the [place]  FDA and  has been authorized for detection and/or diagnosis of SARS-CoV-2 by FDA under an Emergency Use Authorization (EUA). This EUA will remain  in effect (meaning this test can be used) for the duration of the COVID-19 declaration under Section 564(b)(1) of the Act, 21 U.S.C.section 360bbb-3(b)(1), unless the authorization is terminated  or revoked sooner.       Influenza A by PCR NEGATIVE NEGATIVE Final   Influenza B by PCR NEGATIVE NEGATIVE Final    Comment: (NOTE) The Xpert Xpress SARS-CoV-2/FLU/RSV plus assay is intended as an aid in the diagnosis of influenza from Nasopharyngeal swab specimens and should not be used as a sole basis for treatment. Nasal washings and aspirates are unacceptable for Xpert Xpress SARS-CoV-2/FLU/RSV testing.  Fact Sheet for Patients: BloggerCourse.com  Fact Sheet for Healthcare Providers: SeriousBroker.it  This test is not yet approved or cleared by the [place]  FDA and has been authorized for detection and/or diagnosis of SARS-CoV-2 by FDA under an Emergency Use Authorization (EUA). This EUA will remain in effect (meaning this test can be used) for the duration of the COVID-19 declaration under Section 564(b)(1) of the Act, 21 U.S.C. section 360bbb-3(b)(1), unless the authorization is terminated or revoked.     Resp Syncytial Virus by PCR NEGATIVE NEGATIVE Final    Comment: (NOTE) Fact Sheet for Patients: BloggerCourse.com  Fact Sheet for Healthcare Providers: SeriousBroker.it  This test is not yet approved or cleared by the [place]  FDA and has been authorized for detection and/or diagnosis of SARS-CoV-2 by FDA under an Emergency Use Authorization (EUA). This EUA will remain in effect (meaning this test can be used) for  the duration of the COVID-19 declaration under Section 564(b)(1) of the Act, 21 U.S.C. section 360bbb-3(b)(1), unless the authorization is terminated or revoked.  Performed at [facility], [address] [place], [place] [address]          Radiology Studies: DG ESOPHAGUS W SINGLE CM (SOL OR THIN BA) Result Date: 05/27/2024 CLINICAL DATA:  83 year old female currently admitted due to intractable nausea and vomiting. Patient reports tablets and solid foods getting stuck in her mid chest for the past 6 months. She reports intermittent episodes of regurgitation prior to this admission. Denies any difficulties with liquids. EXAM: ESOPHAGUS/BARIUM SWALLOW/TABLET STUDY TECHNIQUE: Single contrast examination was performed using thin liquid barium. This exam was performed by [name] PA-C, and was supervised and interpreted by Dr. Harrietta Sherry. FLUOROSCOPY: Radiation Exposure Index (as provided by the fluoroscopic device): 37.8 mGy Kerma COMPARISON:  CT A/P WO CONTRAST - 05/14/24. FINDINGS: Swallowing:  Appears normal. No vestibular penetration or aspiration seen. Pharynx: Unremarkable. Esophagus: Normal appearance. No obvious mucosal abnormality or visible ulceration. Esophageal motility: Mild esophageal dysmotility leading to delayed passage of contrast from the esophagus into the stomach. Intermittent tertiary contractions and proximal escape. Hiatal Hernia: Possible small hiatal hernia, although poorly visualized on exam due to patient movement. Gastroesophageal reflux: None visualized. Ingested 13mm barium tablet: Tablet initially stuck in the distal esophagus, but subsequently passed with additional water  intake. Other: Limited exam due to patient mobility. Entirety of exam performed at approximately 25 degrees table tilt. IMPRESSION: 1. Mild esophageal dysmotility. 2. Possible small hiatal hernia, poorly visualized during this exam. Electronically Signed   By: Harrietta Sherry M.D.    On: 05/27/2024 12:20        Scheduled Meds:  allopurinol   100 mg Oral q AM   amiodarone   100 mg Oral Daily   cholecalciferol   2,000 Units Oral q AM   enoxaparin  (LOVENOX ) injection  40 mg Subcutaneous Q24H   feeding supplement  1 Container Oral TID BM   fesoterodine   4 mg Oral Daily   furosemide   20 mg Oral Daily   insulin  aspart  0-5 Units Subcutaneous QHS   insulin  aspart  0-9 Units Subcutaneous TID WC   iron  polysaccharides  150 mg Oral Daily   levothyroxine   37.5 mcg Oral Q0600   loratadine   10 mg Oral Daily   multivitamin with minerals  1 tablet Oral Daily   pantoprazole  (PROTONIX ) IV  40 mg Intravenous Q12H   polyethylene glycol  17 g Oral Daily   rOPINIRole   0.5 mg Oral QHS   sertraline   100 mg Oral q AM   sucralfate   1 g Oral QID   thiamine   100 mg Oral Daily   Continuous Infusions:     LOS: 3 days     Devaughn KATHEE Ban, MD [facility]   If 7PM-7AM, please contact night-coverage [email or web address] Password [email or web address] 05/28/2024, 10:38 AM

## 2024-05-28 NOTE — Plan of Care (Signed)
  Problem: Education: Goal: Knowledge of General Education information will improve Description: Including pain rating scale, medication(s)/side effects and non-pharmacologic comfort measures Outcome: Progressing   Problem: Health Behavior/Discharge Planning: Goal: Ability to manage health-related needs will improve Outcome: Progressing   Problem: Clinical Measurements: Goal: Ability to maintain clinical measurements within normal limits will improve Outcome: Progressing Goal: Will remain free from infection Outcome: Progressing   Problem: Nutrition: Goal: Adequate nutrition will be maintained Outcome: Progressing   Problem: Coping: Goal: Level of anxiety will decrease Outcome: Progressing   Problem: Elimination: Goal: Will not experience complications related to bowel motility Outcome: Progressing Goal: Will not experience complications related to urinary retention Outcome: Progressing   Problem: Pain Managment: Goal: General experience of comfort will improve and/or be controlled Outcome: Progressing   Problem: Safety: Goal: Ability to remain free from injury will improve Outcome: Progressing   Problem: Skin Integrity: Goal: Risk for impaired skin integrity will decrease Outcome: Progressing   Problem: Education: Goal: Ability to describe self-care measures that may prevent or decrease complications (Diabetes Survival Skills Education) will improve Outcome: Progressing

## 2024-05-29 ENCOUNTER — Encounter: Payer: Self-pay | Admitting: Family Medicine

## 2024-05-29 ENCOUNTER — Inpatient Hospital Stay: Admitting: Certified Registered"

## 2024-05-29 ENCOUNTER — Encounter
Admission: EM | Disposition: A | Discharge status: Skilled Nursing Facility | Payer: Self-pay | Source: Skilled Nursing Facility | Attending: Obstetrics and Gynecology

## 2024-05-29 ENCOUNTER — Ambulatory Visit: Admitting: Cardiology

## 2024-05-29 DIAGNOSIS — K2971 Gastritis, unspecified, with bleeding: Secondary | ICD-10-CM

## 2024-05-29 DIAGNOSIS — K2950 Unspecified chronic gastritis without bleeding: Secondary | ICD-10-CM

## 2024-05-29 DIAGNOSIS — R112 Nausea with vomiting, unspecified: Secondary | ICD-10-CM | POA: Diagnosis not present

## 2024-05-29 DIAGNOSIS — K317 Polyp of stomach and duodenum: Secondary | ICD-10-CM

## 2024-05-29 HISTORY — PX: HOT HEMOSTASIS: SHX5433

## 2024-05-29 HISTORY — PX: ESOPHAGOGASTRODUODENOSCOPY: SHX5428

## 2024-05-29 LAB — COMPREHENSIVE METABOLIC PANEL WITH GFR
ALT: 56 U/L — ABNORMAL HIGH (ref 0–44)
AST: 61 U/L — ABNORMAL HIGH (ref 15–41)
Albumin: 3.4 g/dL — ABNORMAL LOW (ref 3.5–5.0)
Alkaline Phosphatase: 131 U/L — ABNORMAL HIGH (ref 38–126)
Anion gap: 8 (ref 5–15)
BUN: 27 mg/dL — ABNORMAL HIGH (ref 8–23)
CO2: 29 mmol/L (ref 22–32)
Calcium: 8.8 mg/dL — ABNORMAL LOW (ref 8.9–10.3)
Chloride: 100 mmol/L (ref 98–111)
Creatinine, Ser: 1.28 mg/dL — ABNORMAL HIGH (ref 0.44–1.00)
GFR, Estimated: 42 mL/min — ABNORMAL LOW (ref >60)
Glucose, Bld: 199 mg/dL — ABNORMAL HIGH (ref 70–99)
Potassium: 5 mmol/L (ref 3.5–5.1)
Sodium: 137 mmol/L (ref 135–145)
Total Bilirubin: 1 mg/dL (ref 0.0–1.2)
Total Protein: 6.5 g/dL (ref 6.5–8.1)

## 2024-05-29 LAB — CBC
HCT: 36.9 % (ref 36.0–46.0)
Hemoglobin: 12 g/dL (ref 12.0–15.0)
MCH: 32.1 pg (ref 26.0–34.0)
MCHC: 32.5 g/dL (ref 30.0–36.0)
MCV: 98.7 fL (ref 80.0–100.0)
Platelets: 153 K/uL (ref 150–400)
RBC: 3.74 MIL/uL — ABNORMAL LOW (ref 3.87–5.11)
RDW: 12.1 % (ref 11.5–15.5)
WBC: 4.3 K/uL (ref 4.0–10.5)
nRBC: 0 % (ref 0.0–0.2)

## 2024-05-29 LAB — GLUCOSE, CAPILLARY
Glucose-Capillary: 231 mg/dL — ABNORMAL HIGH (ref 70–99)
Glucose-Capillary: 138 mg/dL — ABNORMAL HIGH (ref 70–99)
Glucose-Capillary: 120 mg/dL — ABNORMAL HIGH (ref 70–99)
Glucose-Capillary: 232 mg/dL — ABNORMAL HIGH (ref 70–99)
Glucose-Capillary: 249 mg/dL — ABNORMAL HIGH (ref 70–99)

## 2024-05-29 SURGERY — EGD (ESOPHAGOGASTRODUODENOSCOPY)
Anesthesia: General

## 2024-05-29 MED ORDER — SODIUM CHLORIDE 0.9 % IV SOLN: INTRAVENOUS | Status: DC | PRN

## 2024-05-29 MED ORDER — LIDOCAINE HCL (CARDIAC) PF 100 MG/5ML IV SOSY
PREFILLED_SYRINGE | INTRAVENOUS | Status: DC | PRN
  Administered 2024-05-29: 100 mg via INTRAVENOUS

## 2024-05-29 MED ORDER — GLYCOPYRROLATE 0.2 MG/ML IJ SOLN
INTRAMUSCULAR | Status: DC | PRN
  Administered 2024-05-29: .2 mg via INTRAVENOUS

## 2024-05-29 MED ORDER — PROPOFOL 500 MG/50ML IV EMUL
INTRAVENOUS | Status: DC | PRN
  Administered 2024-05-29: 145 ug/kg/min via INTRAVENOUS

## 2024-05-29 MED ORDER — METHOCARBAMOL 500 MG PO TABS
500.0000 mg | ORAL_TABLET | Freq: Three times a day (TID) | ORAL | Status: DC
  Administered 2024-05-29 – 2024-05-31 (×6): 500 mg via ORAL
  Filled 2024-05-29 (×7): qty 1

## 2024-05-29 MED ORDER — DICYCLOMINE HCL 10 MG PO CAPS
20.0000 mg | ORAL_CAPSULE | Freq: Three times a day (TID) | ORAL | Status: DC
Start: 2024-05-29 — End: 2024-05-31
  Administered 2024-05-29 – 2024-05-31 (×6): 20 mg via ORAL
  Filled 2024-05-29 (×6): qty 2

## 2024-05-29 MED ORDER — PROPOFOL 10 MG/ML IV BOLUS
INTRAVENOUS | Status: DC | PRN
Start: 2024-05-29 — End: 2024-05-29
  Administered 2024-05-29: 50 mg via INTRAVENOUS

## 2024-05-29 NOTE — Anesthesia Preprocedure Evaluation (Signed)
 Anesthesia Evaluation  Patient identified by MRN, date of birth, ID band Patient awake    Reviewed: Allergy & Precautions, NPO status , Patient's Chart, lab work & pertinent test results  History of Anesthesia Complications (+) PROLONGED EMERGENCE and history of anesthetic complications  Airway Mallampati: III  TM Distance: <3 FB Neck ROM: full    Dental  (+) Chipped, Poor Dentition, Missing, Upper Dentures, Dental Advidsory Given   Pulmonary shortness of breath and with exertion, neg COPD, neg recent URI   Pulmonary exam normal        Cardiovascular Exercise Tolerance: Good hypertension, (-) angina + CAD and +CHF  (-) Past MI and (-) Cardiac Stents Normal cardiovascular exam(-) dysrhythmias + pacemaker (-) Valvular Problems/Murmurs     Neuro/Psych neg Seizures PSYCHIATRIC DISORDERS Anxiety Depression    TIA Neuromuscular disease    GI/Hepatic Neg liver ROS,GERD  Controlled,,  Endo/Other  diabetes, Type 2Hypothyroidism    Renal/GU Renal disease  negative genitourinary   Musculoskeletal   Abdominal   Peds  Hematology negative hematology ROS (+)   Anesthesia Other Findings Patient is NPO appropriate and reports no nausea or vomiting today.  Past Medical History: 04/09/2018: Acute respiratory failure (HCC) No date: Anemia No date: Anxiety No date: Aortic atherosclerosis (HCC) No date: Bowel obstruction (HCC) No date: Carotid artery disease (HCC) No date: Cerebral microvascular disease No date: Chronic heart failure with preserved ejection fraction  (HFpEF) (HCC)     Comment:  a.) TTE 04/10/2018: EF 60-65%, mildly dil LA/RA, mod dil              RV, mod TR, PASP ; b.) TTE 01/22/2023: EF 60-65%,               no rwma, low-nl RV fxn, RVSP 51.2 mmHg. Mild MR. Mod-sev               TR. No date: CKD (chronic kidney disease), stage IV (HCC) 05/29/2020: Closed fracture of one rib of right side No date: Colon  polyp 10/16/2014: Complete tear of left rotator cuff 08/25/2016: Coronary artery disease (non-obstructive)     Comment:  a.) LHC 08/25/2016:  25% oLM-LM, 20% mLAD, 15% o-pLCx -               med mgmt; b.) MV 10/20/2017: no isch/infarct, EF 55-65% No date: DDD (degenerative disc disease), cervical     Comment:  a.) s/p ACDF C6-C7 No date: Depression No date: Diabetic neuropathy (HCC) 03/04/2022: Diarrhea No date: Displaced intertrochanteric fracture of right femur (HCC) No date: Fatty liver No date: Gastritis without bleeding No date: GERD (gastroesophageal reflux disease) No date: GI bleed No date: Gout No date: Hypertension No date: Hypothyroidism No date: Long term current use of amiodarone  No date: Low back pain     Comment:  history of kyphoplasty t12-l1 No date: Morbid obesity (HCC) No date: Multiple gastric polyps 10/10/2023: Nose colonized with MRSA     Comment:  a.) presurgical PCR (+) 10/10/2023 prior to REMOVAL OF               NAIL HARDWARE WITH SCREW EXCHANGE (RIGHT HIP) No date: On apixaban  therapy No date: Osteoarthritis No date: Osteoporosis 2016: PAF (paroxysmal atrial fibrillation) (HCC)     Comment:  a.) CHA2DS2-VASc = 9 (age x 2, sex, HFpEF, HTN, TIA x 2,              vascular disease, T2DM) as of 10/13/2023; b.) s/p DCCV  06/28/2017 (120 J x1), 02/06/2018 (150c J x 1),               07/15/2020 (75 J x ); c.) ardiac rate/rhythm maintained               on oral amiodarone ; chronically anticoagulated using               apixaban  07/01/2015: Presence of permanent cardiac pacemaker     Comment:  a.) PPM placed 07/01/2015; b.) device upgraded to dual               chamber MDT Azure XT DR MRI SureScan (SN: [id number])               08/29/2017 No date: Pulmonary hypertension (HCC)     Comment:  a.) TTE 04/10/2018: PASP 50 mmHg; b.) TTE 01/22/2023:               RVSP 51.2 mmHg No date: Sinus node dysfunction (HCC)     Comment:  a.) s/p PPM placement  06/2015 --> device upgraded               08/2017 No date: T2DM (type 2 diabetes mellitus) (HCC) 11/10/2019: Tendinitis of upper biceps tendon of left shoulder No date: TIA (transient ischemic attack) 05/02/2018: Type 2 diabetes mellitus with neurological complications  (HCC) 03/04/2022: Uncontrolled type 2 diabetes mellitus with hypoglycemia,  with long-term current use of insulin  (HCC) 10/17/2022: Urinary incontinence No date: Vascular dementia, moderate, with anxiety (HCC)  Past Surgical History: 1980: ABDOMINAL HYSTERECTOMY No date: APPENDECTOMY No date: BACK SURGERY     Comment:  2008. plate & screws  2019: BACK SURGERY     Comment:  patient describes kyphoplasty for compression fractures,              MD referral said fusion 04/2015: BREAST BIOPSY; Right     Comment:  stereo fibroadenomatous change, neg for atypia No date: BREAST EXCISIONAL BIOPSY; Left     Comment:  neg 08/25/2016: CARDIAC CATHETERIZATION; Left     Comment:  Procedure: Left Heart Cath and Coronary Angiography;                Surgeon: Wolm JINNY Rhyme, MD;  Location: [facility] INVASIVE               CV LAB;  Service: Cardiovascular;  Laterality: Left; 06/28/2017: CARDIOVERSION; N/A     Comment:  Procedure: CARDIOVERSION;  Surgeon: Rhyme Wolm JINNY,               MD;  Location: [facility] ORS;  Service: Cardiovascular;                Laterality: N/A; 02/06/2018: CARDIOVERSION; N/A     Comment:  Procedure: CARDIOVERSION;  Surgeon: Delford Maude BROCKS, MD;              Location: [facility] ENDOSCOPY;  Service: Cardiovascular;                Laterality: N/A; 07/15/2020: CARDIOVERSION; N/A     Comment:  Procedure: CARDIOVERSION;  Surgeon: Jeffrie Oneil BROCKS, MD;               Location: [facility] ENDOSCOPY;  Service: Cardiovascular;                Laterality: N/A; 2012: CATARACT EXTRACTION, BILATERAL No date: CHOLECYSTECTOMY 1999: colon blockage 2000: COLON SURGERY     Comment:  removed 20% of colon. colon had collapsed 2016:  COLONOSCOPY  Comment:  polyps removed 2016 09/14/2016: DORSAL COMPARTMENT RELEASE; Left     Comment:  Procedure: RELEASE DORSAL COMPARTMENT (DEQUERVAIN);                Surgeon: Lynwood SHAUNNA Hue, MD;  Location: [facility] ORS;                Service: Orthopedics;  Laterality: Left; 01/27/2015: ESOPHAGOGASTRODUODENOSCOPY; N/A     Comment:  Procedure: ESOPHAGOGASTRODUODENOSCOPY (EGD);  Surgeon:               Gladis RAYMOND Mariner, MD;  Location: [facility] ENDOSCOPY;                Service: Endoscopy;  Laterality: N/A; 07/24/2015: ESOPHAGOGASTRODUODENOSCOPY (EGD) WITH PROPOFOL ; N/A     Comment:  Procedure: ESOPHAGOGASTRODUODENOSCOPY (EGD) WITH               PROPOFOL ;  Surgeon: Rogelia Copping, MD;  Location: [facility]               ENDOSCOPY;  Service: Endoscopy;  Laterality: N/A; 04/12/2018: ESOPHAGOGASTRODUODENOSCOPY (EGD) WITH PROPOFOL ; N/A     Comment:  Procedure: ESOPHAGOGASTRODUODENOSCOPY (EGD) WITH               PROPOFOL ;  Surgeon: Therisa Bi, MD;  Location: [facility]               ENDOSCOPY;  Service: Gastroenterology;  Laterality: N/A; 06/22/2018: ESOPHAGOGASTRODUODENOSCOPY (EGD) WITH PROPOFOL ; N/A     Comment:  Procedure: ESOPHAGOGASTRODUODENOSCOPY (EGD) WITH               PROPOFOL ;  Surgeon: Teressa Toribio SHAUNNA, MD;  Location: [facility]               ENDOSCOPY;  Service: Endoscopy;  Laterality: N/A; 07/09/2018: ESOPHAGOGASTRODUODENOSCOPY (EGD) WITH PROPOFOL ; N/A     Comment:  Procedure: ESOPHAGOGASTRODUODENOSCOPY (EGD) WITH               PROPOFOL  with resection of gastric polyps;  Surgeon:               Therisa Bi, MD;  Location: [facility] ENDOSCOPY;  Service:               Gastroenterology;  Laterality: N/A; 05/17/2019: ESOPHAGOGASTRODUODENOSCOPY (EGD) WITH PROPOFOL ; N/A     Comment:  Procedure: ESOPHAGOGASTRODUODENOSCOPY (EGD) WITH               PROPOFOL ;  Surgeon: Therisa Bi, MD;  Location: [facility]               ENDOSCOPY;  Service: Gastroenterology;  Laterality: N/A; 06/22/2018: EUS; N/A     Comment:  Procedure:  UPPER ENDOSCOPIC ULTRASOUND (EUS) RADIAL;                Surgeon: Teressa Toribio SHAUNNA, MD;  Location: [facility] ENDOSCOPY;                Service: Endoscopy;  Laterality: N/A; No date: EYE SURGERY; Bilateral     Comment:  cataract extractions 10/17/2023: HARDWARE REMOVAL; Right     Comment:  Procedure: Right hip removal of nail hardware with screw              exchange;  Surgeon: Tobie Priest, MD;  Location: [facility]               ORS;  Service: Orthopedics;  Laterality: Right; 06/02/2022: HUMERUS IM NAIL; Left     Comment:  Procedure: INTRAMEDULLARY (IM) NAIL HUMERAL;  Surgeon:  Poggi, Norleen PARAS, MD;  Location: [facility] ORS;  Service:               Orthopedics;  Laterality: Left; 01/21/2023: INTRAMEDULLARY (IM) NAIL INTERTROCHANTERIC; Right     Comment:  Procedure: INTRAMEDULLARY (IM) NAIL INTERTROCHANTERIC;                Surgeon: Tobie Priest, MD;  Location: [facility] ORS;  Service:              Orthopedics;  Laterality: Right; 2014: JOINT REPLACEMENT; Bilateral     Comment:  Bilateral Knee replacement 08/29/2017: LEAD REVISION/REPAIR; N/A     Comment:  Procedure: LEAD REVISION/REPAIR;  Surgeon: Inocencio Soyla Lunger, MD;  Location: [facility] INVASIVE CV LAB;  Service:               Cardiovascular;  Laterality: N/A; No date: OOPHORECTOMY 07/01/2015: PACEMAKER INSERTION; Left     Comment:  Procedure: INSERTION PACEMAKER;  Surgeon: Marsa Dooms, MD;  Location: [facility] ORS;  Service:               Cardiovascular;  Laterality: Left; 08/28/2017: PACEMAKER REVISION; N/A     Comment:  Procedure: PACEMAKER REVISION;  Surgeon: Fernande Elspeth BROCKS, MD;  Location: [facility] INVASIVE CV LAB;  Service:               Cardiovascular;  Laterality: N/A; No date: REPLACEMENT TOTAL KNEE BILATERAL 11/21/2019: SHOULDER ARTHROSCOPY WITH ROTATOR CUFF REPAIR AND OPEN  BICEPS TENODESIS; Left     Comment:  Procedure: LEFT SHOULDER ARTHROSCOPY WITH DEBRIDEMENT,               DECOMPRESSION,  ROTATOR CUFF REPAIR AND BICEPS TENOLYSIS;               Surgeon: Edie Norleen PARAS, MD;  Location: [facility] ORS;                Service: Orthopedics;  Laterality: Left; 2013: SHOULDER ARTHROSCOPY WITH SUBACROMIAL DECOMPRESSION; Left 06/28/2017: TEE WITHOUT CARDIOVERSION; N/A     Comment:  Procedure: TRANSESOPHAGEAL ECHOCARDIOGRAM (TEE);                Surgeon: Hester Wolm PARAS, MD;  Location: [facility] ORS;                Service: Cardiovascular;  Laterality: N/A; No date: TUBAL LIGATION  BMI    Body Mass Index: 27.10 kg/m      Reproductive/Obstetrics negative OB ROS                              Anesthesia Physical Anesthesia Plan  ASA: 3  Anesthesia Plan: General   Post-op Pain Management:    Induction: Intravenous  PONV Risk Score and Plan: 3 and Propofol  infusion and TIVA  Airway Management Planned: Natural Airway and Nasal Cannula  Additional Equipment:   Intra-op Plan:   Post-operative Plan:   Informed Consent: I have reviewed the patients History and Physical, chart, labs and discussed the procedure including the risks, benefits and alternatives for the proposed anesthesia with the patient or authorized representative who has indicated his/her understanding and acceptance.   Patient has DNR.  Discussed DNR with patient, Discussed DNR with power of attorney  and Suspend DNR.   Dental Advisory Given  Plan Discussed with: Anesthesiologist, CRNA and Surgeon  Anesthesia Plan Comments: (Patient and son consented for risks of anesthesia including but not limited to:  - adverse reactions to medications - risk of airway placement if required - damage to eyes, teeth, lips or other oral mucosa - nerve damage due to positioning  - sore throat or hoarseness - Damage to heart, brain, nerves, lungs, other parts of body or loss of life  They voiced understanding and assent.)        Anesthesia Quick Evaluation

## 2024-05-29 NOTE — Op Note (Addendum)
 [facility] Gastroenterology Patient Name: Deborah Obrien Procedure Date: 05/29/2024 1:11 PM MRN: [id number] Account #: [id number] Date of Birth: March 25, 1941 Admit Type: Inpatient Age: 83 Room: [facility] ENDO ROOM 3 Gender: Female Note Status: Finalized Instrument Name: Barnie GI Scope [id number] [id number] Procedure:             Upper GI endoscopy Indications:           Abdominal pain Providers:             Rogelia Copping MD, MD Medicines:             Propofol  per Anesthesia Complications:         No immediate complications. Procedure:             Pre-Anesthesia Assessment:                        - Prior to the procedure, a History and Physical was                         performed, and patient medications and allergies were                         reviewed. The patient's tolerance of previous                         anesthesia was also reviewed. The risks and benefits                         of the procedure and the sedation options and risks                         were discussed with the patient. All questions were                         answered, and informed consent was obtained. Prior                         Anticoagulants: The patient has taken no anticoagulant                         or antiplatelet agents. ASA Grade Assessment: II - A                         patient with mild systemic disease. After reviewing                         the risks and benefits, the patient was deemed in                         satisfactory condition to undergo the procedure.                        After obtaining informed consent, the endoscope was                         passed under direct vision. Throughout the procedure,                         the patient's  blood pressure, pulse, and oxygen                         saturations were monitored continuously. The Endoscope                         was introduced through the mouth, and advanced to the                         second part of  duodenum. The upper GI endoscopy was                         accomplished without difficulty. The patient tolerated                         the procedure well. Findings:      Hematin (altered blood/coffee-ground-like material) was found in the       lower third of the esophagus.      Diffuse moderate inflammation characterized by erythema was found in the       gastric antrum. Biopsies were taken with a cold forceps for histology.      Multiple sessile polyps were found in the entire examined stomach.      A single sessile polyp with bleeding and stigmata of recent bleeding was       found in the gastric fundus. Coagulation for hemostasis using argon       plasma at 2 liters/minute and 20 watts was successful.      The examined duodenum was normal. Impression:            - Hematin (altered blood/coffee-ground-like material)                         in the lower third of the esophagus.                        - Gastritis. Biopsied.                        - Multiple gastric polyps.                        - A single gastric polyp. Treated with argon plasma                         coagulation (APC).                        - Normal examined duodenum. Recommendation:        - Return patient to hospital ward for ongoing care.                        - Resume previous diet.                        - Continue present medications.                        - Await pathology results. Procedure Code(s):     --- Professional ---  43255, 59, Esophagogastroduodenoscopy, flexible,                         transoral; with control of bleeding, any method                        43239, Esophagogastroduodenoscopy, flexible,                         transoral; with biopsy, single or multiple Diagnosis Code(s):     --- Professional ---                        R10.9, Unspecified abdominal pain                        K29.70, Gastritis, unspecified, without bleeding CPT copyright 2022 American  Medical Association. All rights reserved. The codes documented in this report are preliminary and upon coder review may  be revised to meet current compliance requirements. Rogelia Copping MD, MD 05/29/2024 1:24:14 PM This report has been signed electronically. Number of Addenda: 0 Note Initiated On: 05/29/2024 1:11 PM Estimated Blood Loss:  Estimated blood loss: none.      [facility]

## 2024-05-29 NOTE — Transfer of Care (Signed)
 Immediate Anesthesia Transfer of Care Note  Patient: Deborah Obrien  Procedure(s) Performed: EGD (ESOPHAGOGASTRODUODENOSCOPY) EGD, WITH ARGON PLASMA COAGULATION  Patient Location: Endoscopy Unit  Anesthesia Type:General  Level of Consciousness: drowsy and patient cooperative  Airway & Oxygen Therapy: Patient Spontanous Breathing and Patient connected to face mask oxygen  Post-op Assessment: Report given to RN and Post -op Vital signs reviewed and stable  Post vital signs: Reviewed and stable  Last Vitals:  Vitals Value Taken Time  BP 151/46 05/29/24 13:23  Temp 36.4 C 05/29/24 13:23  Pulse 62 05/29/24 13:23  Resp 16 05/29/24 13:23  SpO2 100 % 05/29/24 13:23    Last Pain:  Vitals:   05/29/24 1323  TempSrc: Temporal  PainSc: Asleep      Patients Stated Pain Goal: 0 (05/27/24 0953)  Complications: No notable events documented.

## 2024-05-29 NOTE — Progress Notes (Signed)
 PROGRESS NOTE    Deborah Obrien  FMW:[id number] DOB: [birth year] DOA: 05/25/2024 PCP: Cordella Corning, FNP    Brief Narrative:   From admission h and p   Deborah Obrien is a 83 y.o. Caucasian homeless female with medical history significant for anxiety, carotid artery stenosis, chronic heart failure with preserved EF, coronary artery disease, hypertension, and hypothyroidism, who presented to the emergency room with acute onset of dyspnea initially as well as recurrent nausea and vomiting with epigastric abdominal pain over the last 12 hours.  She was not dyspneic during my interview.  Despite given Zofran  at her SNF she continued to have recurrent vomiting.  No fever or chills.  No chest pain or palpitations.  No cough or wheezing or dyspnea.  No dysuria, oliguria or hematuria or flank pain.  9/17: Patient had recurrent dysphagia and abdominal pain.  GI reengaged.  EGD today.  Assessment & Plan:   Principal Problem:   Intractable nausea and vomiting Active Problems:   Elevated LFTs   Hypothyroidism   Paroxysmal atrial fibrillation (HCC)   Type 2 diabetes mellitus without complications (HCC)   CAD (coronary artery disease)   Gout   Cardiac pacemaker in situ   Depression   CKD stage 3b, GFR 30-44 ml/min (HCC)  # Epigastric abdominal pain # Nausea/vomiting One day acute onset of these symptoms. Emesis nbnb. CT abdomen unremarkable, though hiatal hernia seen on CT a week ago. No sig pathology seen on EGD earlier this year. Last apixaban  AM of 9/14. No report of bleeding. Does have mid stable transaminitis that is chronic, mildly elevated t bili that has normalized, is s/p cholecystectomy and ct and u/s do not show ductal dilation, doubt therefore obstructive process. Esophogram shows mild esophageal dysmotility, possible small hiatal hernia. GI evaluated on 9/15 and symptoms thought to be msk, EGD deferred.  Subsequently patient developed severe abdominal pain associated with eating  and dysphagia  Plan:  N.p.o. for EGD today   # Debility Resides at [facility] Will return on discharge   # A-fib Rate controlled Continue home amiodarone  Holding apixaban  for now   # HFpEF Euvolemic Continue home Lasix    # Chronic pain Continue home tramadol  and gabapentin    # T2DM Hypoglycemic yesterday morning, resolved Will initiate Lantus  on return from EGD   # Hypothyroid Continue home synthroid    # restless leg Continue home requip    # GAD/MDD Continue home sertraline    # Gout Asymptomatic Continue home allopurinol    DVT prophylaxis: Lovenox  Code Status: DNR Family Communication: None today Disposition Plan: Status is: Inpatient Remains inpatient appropriate because: Dysphagia, abdominal pain of unclear etiology.  EGD today.   Level of care: Telemetry Medical  Consultants:  GI  Procedures:  EGD  Antimicrobials: None   Subjective: Seen and examined.  Resting in bed.  Endorses abdominal pain.  Overall appears comfortable.  Objective: Vitals:   05/28/24 1955 05/29/24 0441 05/29/24 0717 05/29/24 1234  BP: (!) 107/52 112/63 (!) 118/91 (!) 116/49  Pulse: 64 (!) 59 63 64  Resp: 18 17 16 18   Temp: 98.4 F (36.9 C) 98.1 F (36.7 C) 98.1 F (36.7 C) (!) 96.1 F (35.6 C)  TempSrc: Oral Oral  Temporal  SpO2: 100% 100% 100% 100%  Weight:      Height:        Intake/Output Summary (Last 24 hours) at 05/29/2024 1237 Last data filed at 05/29/2024 0629 Gross per 24 hour  Intake 1080 ml  Output 1 ml  Net 1079 ml   Filed Weights   05/25/24 2342  Weight: 70.3 kg    Examination:  General exam: Appears calm and comfortable  Respiratory system: Clear to auscultation. Respiratory effort normal. Cardiovascular system: S1-S2, RRR, no murmurs, no pedal edema Gastrointestinal system: Soft, mild TTP epigastrium, normal bowel sounds Central nervous system: Alert and oriented. No focal neurological deficits. Extremities: Symmetric 5 x 5  power. Skin: No rashes, lesions or ulcers Psychiatry: Judgement and insight appear normal. Mood & affect appropriate.     Data Reviewed: I have personally reviewed following labs and imaging studies  CBC: Recent Labs  Lab 05/25/24 1734 05/26/24 0419 05/27/24 0319 05/28/24 0352 05/29/24 0430  WBC 7.2 3.7* 3.4* 4.3 4.3  NEUTROABS 6.2  --   --   --   --   HGB 14.1 10.7* 11.7* 12.0 12.0  HCT 44.0 32.7* 35.9* 36.5 36.9  MCV 101.6* 100.0 100.6* 100.0 98.7  PLT 98* 115* 129* 141* 153   Basic Metabolic Panel: Recent Labs  Lab 05/25/24 1734 05/26/24 0419 05/27/24 0319 05/28/24 0352 05/29/24 0430  NA 137 138 142 139 137  K 4.8 4.3 4.0 4.2 5.0  CL 103 107 109 103 100  CO2 21* 23 25 28 29   GLUCOSE 337* 253* 57* 150* 199*  BUN 57* 46* 36* 27* 27*  CREATININE 1.63* 1.37* 1.22* 1.21* 1.28*  CALCIUM  8.8* 8.0* 8.5* 8.7* 8.8*  MG 1.9  --   --   --   --    GFR: Estimated Creatinine Clearance: 31.3 mL/min (A) (by C-G formula based on SCr of 1.28 mg/dL (H)). Liver Function Tests: Recent Labs  Lab 05/25/24 1734 05/26/24 0419 05/27/24 0319 05/28/24 0352 05/29/24 0430  AST 106* 87* 80* 71* 61*  ALT 73* 64* 69* 62* 56*  ALKPHOS 134* 113 129* 133* 131*  BILITOT 1.8* 1.2 0.8 0.9 1.0  PROT 7.8 5.9* 6.7 6.6 6.5  ALBUMIN  4.0 3.0* 3.5 3.4* 3.4*   Recent Labs  Lab 05/25/24 1734  LIPASE 30   No results for input(s): AMMONIA in the last 168 hours. Coagulation Profile: No results for input(s): INR, PROTIME in the last 168 hours. Cardiac Enzymes: No results for input(s): CKTOTAL, CKMB, CKMBINDEX, TROPONINI in the last 168 hours. BNP (last 3 results) No results for input(s): PROBNP in the last 8760 hours. HbA1C: No results for input(s): HGBA1C in the last 72 hours. CBG: Recent Labs  Lab 05/28/24 1208 05/28/24 1641 05/28/24 1955 05/29/24 0718 05/29/24 1127  GLUCAP 237* 278* 58* 231* 138*   Lipid Profile: No results for input(s): CHOL, HDL, LDLCALC,  TRIG, CHOLHDL, LDLDIRECT in the last 72 hours. Thyroid  Function Tests: No results for input(s): TSH, T4TOTAL, FREET4, T3FREE, THYROIDAB in the last 72 hours. Anemia Panel: No results for input(s): VITAMINB12, FOLATE, FERRITIN, TIBC, IRON , RETICCTPCT in the last 72 hours. Sepsis Labs: No results for input(s): PROCALCITON, LATICACIDVEN in the last 168 hours.  Recent Results (from the past 240 hours)  Resp panel by RT-PCR (RSV, Flu A&B, Covid) Anterior Nasal Swab     Status: None   Collection Time: 05/25/24  5:35 PM   Specimen: Anterior Nasal Swab  Result Value Ref Range Status   SARS Coronavirus 2 by RT PCR NEGATIVE NEGATIVE Final    Comment: (NOTE) SARS-CoV-2 target nucleic acids are NOT DETECTED.  The SARS-CoV-2 RNA is generally detectable in upper respiratory specimens during the acute phase of infection. The lowest concentration of SARS-CoV-2 viral copies this assay can detect is 138 copies/mL.  A negative result does not preclude SARS-Cov-2 infection and should not be used as the sole basis for treatment or other patient management decisions. A negative result may occur with  improper specimen collection/handling, submission of specimen other than nasopharyngeal swab, presence of viral mutation(s) within the areas targeted by this assay, and inadequate number of viral copies(<138 copies/mL). A negative result must be combined with clinical observations, patient history, and epidemiological information. The expected result is Negative.  Fact Sheet for Patients:  BloggerCourse.com  Fact Sheet for Healthcare Providers:  SeriousBroker.it  This test is no t yet approved or cleared by the [place]  FDA and  has been authorized for detection and/or diagnosis of SARS-CoV-2 by FDA under an Emergency Use Authorization (EUA). This EUA will remain  in effect (meaning this test can be used) for the  duration of the COVID-19 declaration under Section 564(b)(1) of the Act, 21 U.S.C.section 360bbb-3(b)(1), unless the authorization is terminated  or revoked sooner.       Influenza A by PCR NEGATIVE NEGATIVE Final   Influenza B by PCR NEGATIVE NEGATIVE Final    Comment: (NOTE) The Xpert Xpress SARS-CoV-2/FLU/RSV plus assay is intended as an aid in the diagnosis of influenza from Nasopharyngeal swab specimens and should not be used as a sole basis for treatment. Nasal washings and aspirates are unacceptable for Xpert Xpress SARS-CoV-2/FLU/RSV testing.  Fact Sheet for Patients: BloggerCourse.com  Fact Sheet for Healthcare Providers: SeriousBroker.it  This test is not yet approved or cleared by the [place]  FDA and has been authorized for detection and/or diagnosis of SARS-CoV-2 by FDA under an Emergency Use Authorization (EUA). This EUA will remain in effect (meaning this test can be used) for the duration of the COVID-19 declaration under Section 564(b)(1) of the Act, 21 U.S.C. section 360bbb-3(b)(1), unless the authorization is terminated or revoked.     Resp Syncytial Virus by PCR NEGATIVE NEGATIVE Final    Comment: (NOTE) Fact Sheet for Patients: BloggerCourse.com  Fact Sheet for Healthcare Providers: SeriousBroker.it  This test is not yet approved or cleared by the [place]  FDA and has been authorized for detection and/or diagnosis of SARS-CoV-2 by FDA under an Emergency Use Authorization (EUA). This EUA will remain in effect (meaning this test can be used) for the duration of the COVID-19 declaration under Section 564(b)(1) of the Act, 21 U.S.C. section 360bbb-3(b)(1), unless the authorization is terminated or revoked.  Performed at [facility], [address], [place], [place] [address]          Radiology Studies: No results  found.      Scheduled Meds:  [MAR Hold] allopurinol   100 mg Oral q AM   [MAR Hold] amiodarone   100 mg Oral Daily   [MAR Hold] cholecalciferol   2,000 Units Oral q AM   [MAR Hold] dicyclomine   20 mg Oral TID AC   [MAR Hold] enoxaparin  (LOVENOX ) injection  40 mg Subcutaneous Q24H   [MAR Hold] feeding supplement  1 Container Oral TID BM   [MAR Hold] fesoterodine   4 mg Oral Daily   [MAR Hold] furosemide   20 mg Oral Daily   [MAR Hold] insulin  aspart  0-5 Units Subcutaneous QHS   [MAR Hold] insulin  aspart  0-9 Units Subcutaneous TID WC   [MAR Hold] iron  polysaccharides  150 mg Oral Daily   [MAR Hold] levothyroxine   37.5 mcg Oral Q0600   [MAR Hold] loratadine   10 mg Oral Daily   [MAR Hold] methocarbamol   500 mg Oral TID   [  MAR Hold] multivitamin with minerals  1 tablet Oral Daily   [MAR Hold] pantoprazole  (PROTONIX ) IV  40 mg Intravenous Q12H   [MAR Hold] polyethylene glycol  17 g Oral Daily   [MAR Hold] rOPINIRole   0.5 mg Oral QHS   [MAR Hold] sertraline   100 mg Oral q AM   [MAR Hold] sucralfate   1 g Oral QID   [MAR Hold] thiamine   100 mg Oral Daily   Continuous Infusions:   LOS: 4 days     Calvin KATHEE Robson, MD [facility]   If 7PM-7AM, please contact night-coverage  05/29/2024, 12:37 PM

## 2024-05-29 NOTE — NC FL2 (Signed)
 [place]  MEDICAID [id number] LEVEL OF CARE FORM     IDENTIFICATION  Patient Name: Deborah Obrien Birthdate: 1941-01-28 Sex: female Admission Date (Current Location): 05/25/2024  [facility] and [place] Number:  Chiropodist and Address:  [facility], [address] [place], [place] [address]      Provider Number: [id number]  Attending Physician Name and Address:  Jhonny Calvin NOVAK, MD  Relative Name and Phone Number:  Syndey, [place] [phone])  [phone]    Current Level of Care: Hospital Recommended Level of Care: Assisted Living Facility Prior Approval Number:    Date Approved/Denied:   PASRR Number:    Discharge Plan: Domiciliary (Rest home)    Current Diagnoses: Patient Active Problem List   Diagnosis Date Noted   CKD stage 3b, GFR 30-44 ml/min (HCC) 05/26/2024   Intractable nausea and vomiting 05/25/2024   Elevated LFTs 05/25/2024   Depression 05/25/2024   Type 2 diabetes mellitus without complications (HCC) 05/25/2024   Polyp of transverse colon 01/10/2024   Blood in stool 01/09/2024   BRBPR (bright red blood per rectum) 01/08/2024   Gastroenteritis 01/08/2024   Hemorrhagic shock (HCC) 10/31/2023   RSV (respiratory syncytial virus pneumonia) 10/31/2023   Hyperkalemia 10/31/2023   Hyponatremia 10/31/2023   Hematoma of right hip 10/28/2023   Acute respiratory failure with hypoxia (HCC) 02/01/2023   Paroxysmal atrial fibrillation (HCC) 01/24/2023   Pulmonary hypertension (HCC) 01/22/2023   Metabolic acidosis 01/22/2023   Fall 01/22/2023   Transaminitis 01/21/2023   Hypotension after procedure 01/21/2023   Shock (HCC) 01/21/2023   Closed fracture of right hip (HCC) 01/21/2023   Nausea 01/21/2023   Chronic heart failure with preserved ejection fraction (HCC) 01/01/2023   Restless leg syndrome 05/31/2022   Hypothyroidism 05/30/2022   Drop in hemoglobin 05/30/2022   Elevated liver function tests 05/30/2022   History of gout  05/30/2022   Humerus fracture 05/29/2022   Abnormal urinalysis 03/05/2022   Hypoglycemia 03/04/2022   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/02/2022   Anemia in chronic kidney disease 03/02/2022   Congestive heart failure (HCC) 03/02/2022   Osteoporosis 02/13/2020   Primary osteoarthritis of left shoulder 11/10/2019   Uses walker 09/27/2018   UTI (urinary tract infection) 06/14/2018   Type 2 diabetes mellitus with renal manifestations (HCC) 05/02/2018   Chronic anticoagulation 04/09/2018   Cardiac pacemaker in situ 04/09/2018   H/O TIA (transient ischemic attack) and stroke 04/09/2018   CAD (coronary artery disease) 04/09/2018   Hypotension 02/04/2018   Sinus node dysfunction (HCC) 08/24/2017   Dizziness 06/12/2017   Nephrolithiasis 02/22/2016   Diabetic neuropathy with neurologic complication (HCC) 11/30/2015   Recurrent major depressive disorder, in remission (HCC) 08/11/2015   Polyp of stomach and duodenum 07/29/2015   Gastric polyp    Gastro-esophageal reflux disease without esophagitis    Epigastric pain    Permanent atrial fibrillation (HCC) 06/29/2015   Intestinal obstruction (HCC) 02/20/2015   DDD (degenerative disc disease), lumbar 01/19/2015   Back pain 01/12/2015   Gout 01/12/2015   Intervertebral disc disorder with radiculopathy of lumbar region 09/17/2014   Lumbar radiculitis 09/17/2014   Lumbar stenosis with neurogenic claudication 09/17/2014   Neuritis or radiculitis due to rupture of lumbar intervertebral disc 09/17/2014   Atherosclerosis of both carotid arteries 07/15/2014   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   Bilateral carotid artery disease (HCC) 07/15/2014   Body mass index (BMI) of 40.0-44.9 in adult [facility]) 06/21/2014   Mixed urge and stress incontinence 06/25/2013  Nocturia 06/25/2013   Blurring of visual image 04/11/2012   Other visual disturbances 04/11/2012    Orientation RESPIRATION BLADDER Height &  Weight     Self, Time, Situation, Place    Incontinent Weight: 70.3 kg Height:  5' 3 (160 cm)  BEHAVIORAL SYMPTOMS/MOOD NEUROLOGICAL BOWEL NUTRITION STATUS      Incontinent Diet (NPO)  AMBULATORY STATUS COMMUNICATION OF NEEDS Skin   Limited Assist Verbally                         Personal Care Assistance Level of Assistance  Feeding, Dressing, Bathing Bathing Assistance: Limited assistance Feeding assistance: Limited assistance Dressing Assistance: Limited assistance     Functional Limitations Info             SPECIAL CARE FACTORS FREQUENCY                       Contractures      Additional Factors Info  Code Status, Allergies Code Status Info: DNR Allergies Info: Fish Allergy, Macrolides And Ketolide, Meperidine,Prednisone ,Shellfish Allergy, Sulfa Antibiotics, Sulfacetamide Sodium, Telbivudine, Uloric (febuxostat),  Aspirin , Celecoxib,Cephalexin, Codeine Medium Intolerance Diarrhea, Nausea Only, Other (See Comments) Reaction:  Stomach pain Pt tolerates morphine   Dilaudid  (hydromorphone  Hcl) Medium Hypersensitivity Other (See Comments) Reactions: easy to overdose - blood pressure drops really low  Erythromycin Medium  Diarrhea, Nausea Only, Other (See Comments) Reaction:  Stomach pain  Iodinated Contrast Media Medium  Rash, Other (See Comments) Pt states that she is unable to have because she has chronic kidney disease.    Lipitor (atorvastatin Calcium ) Medium Hypersensitivity Nausea Only Reaction: nausea, weakness, pass blood  Oxycodone  Medium  Other (See Comments) Reaction:  Stomach pain  Pseudoephedrine Not Specified     Aleve (naproxen) Low Intolerance  Reaction: severe stomach pain  Atorvastatin Low  Nausea Only, Other (See Comments) Weakness  Cephalosporins Low Intolerance Diarrhea, Nausea Only, Other (See Comments) Other: abdominal pain Tolerated 1st generation cephalosporin (CEFAZOLIN ) on 06/02/2022 and 01/21/2023 without documented ADRs.  Doxycycline Low  Intolerance  Stomach pain  Iodine Low  Rash   Motrin (ibuprofen) Low Intolerance  Reaction: severe stomach pain  Tape Low Hypersensitivity Other (See Comments) Causes pts skin to tear Pt states that she is able to use paper tape.    Valdecoxib Low  Swelling, Other (See Comments) Pt states that her hands and feet swell.           Current Medications (05/29/2024):  This is the current hospital active medication list Current Facility-Administered Medications  Medication Dose Route Frequency Provider Last Rate Last Admin   acetaminophen  (TYLENOL ) tablet 650 mg  650 mg Oral Q6H PRN Mansy, Jan A, MD       Or   acetaminophen  (TYLENOL ) suppository 650 mg  650 mg Rectal Q6H PRN Mansy, Jan A, MD       albuterol  (PROVENTIL ) (2.5 MG/3ML) 0.083% nebulizer solution 2.5 mg  2.5 mg Inhalation Q6H PRN Mansy, Jan A, MD       allopurinol  (ZYLOPRIM ) tablet 100 mg  100 mg Oral q AM Mansy, Jan A, MD   100 mg at 05/29/24 0646   alum & mag hydroxide-simeth (MAALOX/MYLANTA) 200-200-20 MG/5ML suspension 10 mL  10 mL Oral TID PRN Mansy, Jan A, MD   10 mL at 05/29/24 0828   amiodarone  (PACERONE ) tablet 100 mg  100 mg Oral Daily Mansy, Jan A, MD   100 mg at 05/29/24 9170   cholecalciferol  (  VITAMIN D3) 25 MCG (1000 UNIT) tablet 2,000 Units  2,000 Units Oral q AM Mansy, Madison LABOR, MD   2,000 Units at 05/29/24 0646   dicyclomine  (BENTYL ) capsule 10 mg  10 mg Oral TID AC Wouk, Devaughn Sayres, MD   10 mg at 05/29/24 0829   enoxaparin  (LOVENOX ) injection 40 mg  40 mg Subcutaneous Q24H Elesa Perkins, RPH   40 mg at 05/29/24 9170   feeding supplement (BOOST / RESOURCE BREEZE) liquid 1 Container  1 Container Oral TID BM Wouk, Devaughn Sayres, MD   1 Container at 05/28/24 2011   fesoterodine  (TOVIAZ ) tablet 4 mg  4 mg Oral Daily Mansy, Jan A, MD   4 mg at 05/28/24 9040   furosemide  (LASIX ) tablet 20 mg  20 mg Oral Daily Mansy, Jan A, MD   20 mg at 05/29/24 9170   HYDROmorphone  (DILAUDID ) injection 1 mg  1 mg Intravenous Q4H PRN Mansy, Jan  A, MD   1 mg at 05/27/24 1955   insulin  aspart (novoLOG ) injection 0-5 Units  0-5 Units Subcutaneous QHS Wouk, Devaughn Sayres, MD       insulin  aspart (novoLOG ) injection 0-9 Units  0-9 Units Subcutaneous TID WC Kandis Devaughn Sayres, MD   3 Units at 05/29/24 0830   iron  polysaccharides (NIFEREX) capsule 150 mg  150 mg Oral Daily Mansy, Jan A, MD   150 mg at 05/28/24 9040   levothyroxine  (SYNTHROID ) tablet 37.5 mcg  37.5 mcg Oral Q0600 Mansy, Jan A, MD   37.5 mcg at 05/29/24 9353   loratadine  (CLARITIN ) tablet 10 mg  10 mg Oral Daily Mansy, Jan A, MD   10 mg at 05/29/24 9170   magnesium  hydroxide (MILK OF MAGNESIA) suspension 30 mL  30 mL Oral Daily PRN Mansy, Jan A, MD       multivitamin with minerals tablet 1 tablet  1 tablet Oral Daily Mansy, Jan A, MD   1 tablet at 05/29/24 9170   ondansetron  (ZOFRAN ) tablet 4 mg  4 mg Oral Q6H PRN Mansy, Jan A, MD       Or   ondansetron  (ZOFRAN ) injection 4 mg  4 mg Intravenous Q6H PRN Mansy, Jan A, MD   4 mg at 05/29/24 9262   pantoprazole  (PROTONIX ) injection 40 mg  40 mg Intravenous Q12H Kandis Devaughn Sayres, MD   40 mg at 05/28/24 2221   polyethylene glycol (MIRALAX  / GLYCOLAX ) packet 17 g  17 g Oral Daily Mansy, Jan A, MD   17 g at 05/28/24 0948   rOPINIRole  (REQUIP ) tablet 0.5 mg  0.5 mg Oral QHS Mansy, Jan A, MD   0.5 mg at 05/28/24 2221   senna (SENOKOT) tablet 8.6 mg  1 tablet Oral QHS PRN Mansy, Jan A, MD       sertraline  (ZOLOFT ) tablet 100 mg  100 mg Oral q AM Mansy, Jan A, MD   100 mg at 05/29/24 0646   sucralfate  (CARAFATE ) tablet 1 g  1 g Oral QID Mansy, Jan A, MD   1 g at 05/29/24 9170   thiamine  (VITAMIN B1) tablet 100 mg  100 mg Oral Daily Mansy, Jan A, MD   100 mg at 05/29/24 0829   traMADol  (ULTRAM ) tablet 50 mg  50 mg Oral Q6H PRN Kandis Devaughn Sayres, MD   50 mg at 05/28/24 0949   traZODone  (DESYREL ) tablet 25 mg  25 mg Oral QHS PRN Mansy, Jan A, MD   25 mg at 05/25/24 2249     Discharge  Medications: Please see discharge summary for a list  of discharge medications.  Relevant Imaging Results:  Relevant Lab Results:   Additional Information SS#: [id number]  Dalia GORMAN Fuse, RN

## 2024-05-29 NOTE — Progress Notes (Signed)
 Spoke with Franky Finder... son, to inform him that Ms Schley did well during the EGD and is on her way back to the room. He is at work. No family in waiting room.

## 2024-05-29 NOTE — Plan of Care (Signed)
   Problem: Education: Goal: Knowledge of General Education information will improve Description Including pain rating scale, medication(s)/side effects and non-pharmacologic comfort measures Outcome: Progressing   Problem: Clinical Measurements: Goal: Will remain free from infection Outcome: Progressing   Problem: Nutrition: Goal: Adequate nutrition will be maintained Outcome: Progressing   Problem: Coping: Goal: Level of anxiety will decrease Outcome: Progressing

## 2024-05-29 NOTE — Anesthesia Procedure Notes (Signed)
 Procedure Name: General with mask airway Date/Time: 05/29/2024 1:13 PM  Performed by: Ledora Duncan, CRNAPre-anesthesia Checklist: Patient identified, Emergency Drugs available, Suction available and Patient being monitored Patient Re-evaluated:Patient Re-evaluated prior to induction Oxygen Delivery Method: Simple face mask Induction Type: IV induction Placement Confirmation: positive ETCO2 and breath sounds checked- equal and bilateral Dental Injury: Teeth and Oropharynx as per pre-operative assessment

## 2024-05-29 NOTE — Plan of Care (Signed)
  Problem: Health Behavior/Discharge Planning: Goal: Ability to manage health-related needs will improve Outcome: Progressing   Problem: Clinical Measurements: Goal: Will remain free from infection Outcome: Progressing   Problem: Activity: Goal: Risk for activity intolerance will decrease Outcome: Progressing   Problem: Elimination: Goal: Will not experience complications related to bowel motility Outcome: Progressing   

## 2024-05-29 NOTE — Inpatient Diabetes Management (Addendum)
 Inpatient Diabetes Program Recommendations  AACE/ADA: New Consensus Statement on Inpatient Glycemic Control (2015)  Target Ranges:  Prepandial:   less than 140 mg/dL      Peak postprandial:   less than 180 mg/dL (1-2 hours)      Critically ill patients:  140 - 180 mg/dL    Latest Reference Range & Units 05/28/24 08:00 05/28/24 12:08 05/28/24 16:41 05/28/24 19:55  Glucose-Capillary 70 - 99 mg/dL 843 (H)  2 units Novolog   237 (H)  3 units Novolog   278 (H)  5 units Novolog   58 (L)    Latest Reference Range & Units 05/29/24 07:18  Glucose-Capillary 70 - 99 mg/dL 768 (H)  (H): Data is abnormally high     Home DM Meds:  Lantus  17 units daily      Novolin R 1-10 units TID meal coverage  Current Orders: Novolog  Sensitive Correction Scale/ SSI (0-9 units) TID AC + HS    MD- Note Hypoglycemia last PM at bedtime after getting 5 units Novolog  SSI--suspect the Novolog  caused the low  CBG 231 this AM.  Pt takes Lantus  daily at home.  Please consider starting Lantus  8 units daily (50% home dose)    --Will follow patient during hospitalization--  Adina Rudolpho Arrow RN, MSN, CDCES Diabetes Coordinator Inpatient Glycemic Control Team Team Pager: [phone] (8a-5p)

## 2024-05-30 ENCOUNTER — Encounter: Payer: Self-pay | Admitting: Gastroenterology

## 2024-05-30 DIAGNOSIS — K317 Polyp of stomach and duodenum: Secondary | ICD-10-CM | POA: Diagnosis not present

## 2024-05-30 DIAGNOSIS — R112 Nausea with vomiting, unspecified: Secondary | ICD-10-CM | POA: Diagnosis not present

## 2024-05-30 LAB — CBC
HCT: 36.3 % (ref 36.0–46.0)
Hemoglobin: 12.2 g/dL (ref 12.0–15.0)
MCH: 32.7 pg (ref 26.0–34.0)
MCHC: 33.6 g/dL (ref 30.0–36.0)
MCV: 97.3 fL (ref 80.0–100.0)
Platelets: 161 K/uL (ref 150–400)
RBC: 3.73 MIL/uL — ABNORMAL LOW (ref 3.87–5.11)
RDW: 12.1 % (ref 11.5–15.5)
WBC: 4.8 K/uL (ref 4.0–10.5)
nRBC: 0 % (ref 0.0–0.2)

## 2024-05-30 LAB — COMPREHENSIVE METABOLIC PANEL WITH GFR
ALT: 53 U/L — ABNORMAL HIGH (ref 0–44)
AST: 55 U/L — ABNORMAL HIGH (ref 15–41)
Albumin: 3.7 g/dL (ref 3.5–5.0)
Alkaline Phosphatase: 125 U/L (ref 38–126)
Anion gap: 13 (ref 5–15)
BUN: 34 mg/dL — ABNORMAL HIGH (ref 8–23)
CO2: 27 mmol/L (ref 22–32)
Calcium: 9 mg/dL (ref 8.9–10.3)
Chloride: 95 mmol/L — ABNORMAL LOW (ref 98–111)
Creatinine, Ser: 1.47 mg/dL — ABNORMAL HIGH (ref 0.44–1.00)
GFR, Estimated: 35 mL/min — ABNORMAL LOW (ref >60)
Glucose, Bld: 224 mg/dL — ABNORMAL HIGH (ref 70–99)
Potassium: 4.2 mmol/L (ref 3.5–5.1)
Sodium: 135 mmol/L (ref 135–145)
Total Bilirubin: 1.4 mg/dL — ABNORMAL HIGH (ref 0.0–1.2)
Total Protein: 6.7 g/dL (ref 6.5–8.1)

## 2024-05-30 LAB — SURGICAL PATHOLOGY

## 2024-05-30 LAB — GLUCOSE, CAPILLARY
Glucose-Capillary: 279 mg/dL — ABNORMAL HIGH (ref 70–99)
Glucose-Capillary: 217 mg/dL — ABNORMAL HIGH (ref 70–99)
Glucose-Capillary: 275 mg/dL — ABNORMAL HIGH (ref 70–99)
Glucose-Capillary: 69 mg/dL — ABNORMAL LOW (ref 70–99)
Glucose-Capillary: 132 mg/dL — ABNORMAL HIGH (ref 70–99)
Glucose-Capillary: 134 mg/dL — ABNORMAL HIGH (ref 70–99)

## 2024-05-30 MED ORDER — FLUTICASONE PROPIONATE 50 MCG/ACT NA SUSP
1.0000 | Freq: Every day | NASAL | Status: DC
Start: 2024-05-30 — End: 2024-05-31
  Administered 2024-05-30 – 2024-05-31 (×2): 1 via NASAL
  Filled 2024-05-30: qty 16

## 2024-05-30 MED ORDER — MIDODRINE HCL 5 MG PO TABS
5.0000 mg | ORAL_TABLET | Freq: Three times a day (TID) | ORAL | Status: DC
  Administered 2024-05-30 – 2024-05-31 (×3): 5 mg via ORAL
  Filled 2024-05-30 (×3): qty 1

## 2024-05-30 MED ORDER — GABAPENTIN 100 MG PO CAPS
100.0000 mg | ORAL_CAPSULE | Freq: Three times a day (TID) | ORAL | Status: DC
  Administered 2024-05-30 – 2024-05-31 (×3): 100 mg via ORAL
  Filled 2024-05-30 (×4): qty 1

## 2024-05-30 MED ORDER — ENOXAPARIN SODIUM 30 MG/0.3ML IJ SOSY
30.0000 mg | PREFILLED_SYRINGE | INTRAMUSCULAR | Status: DC
  Administered 2024-05-31: 30 mg via SUBCUTANEOUS
  Filled 2024-05-30: qty 0.3

## 2024-05-30 MED ORDER — INSULIN GLARGINE 100 UNIT/ML ~~LOC~~ SOLN
8.0000 [IU] | Freq: Every day | SUBCUTANEOUS | Status: DC
  Administered 2024-05-30 – 2024-05-31 (×2): 8 [IU] via SUBCUTANEOUS
  Filled 2024-05-30 (×2): qty 0.08

## 2024-05-30 NOTE — Anesthesia Postprocedure Evaluation (Signed)
 Anesthesia Post Note  Patient: Deborah Obrien  Procedure(s) Performed: EGD (ESOPHAGOGASTRODUODENOSCOPY) EGD, WITH ARGON PLASMA COAGULATION  Patient location during evaluation: Endoscopy Anesthesia Type: General Level of consciousness: awake and alert Pain management: pain level controlled Vital Signs Assessment: post-procedure vital signs reviewed and stable Respiratory status: spontaneous breathing, nonlabored ventilation, respiratory function stable and patient connected to nasal cannula oxygen Cardiovascular status: blood pressure returned to baseline and stable Postop Assessment: no apparent nausea or vomiting Anesthetic complications: no   No notable events documented.   Last Vitals:  Vitals:   05/30/24 0346 05/30/24 0857  BP: (!) 114/53 (!) 111/48  Pulse: 63 (!) 59  Resp: 16 19  Temp: 36.6 C 37 C  SpO2: 99% 98%    Last Pain:  Vitals:   05/30/24 1150  TempSrc:   PainSc: 6                  Prentice Murphy

## 2024-05-30 NOTE — NC FL2 (Deleted)
 [place]  MEDICAID [id number] LEVEL OF CARE FORM     IDENTIFICATION  Patient Name: Deborah Obrien Birthdate: [birth year] Sex: female Admission Date (Current Location): 05/25/2024  [facility] and [place] Number:  Chiropodist and Address:  [facility], [address], [place], [place] [address]      Provider Number: [id number]  Attending Physician Name and Address:  Jhonny Calvin NOVAK, MD  Relative Name and Phone Number:  Mairyn, Lenahan [phone])  [phone]    Current Level of Care: Hospital Recommended Level of Care: Assisted Living Facility Prior Approval Number:    Date Approved/Denied:   PASRR Number:    Discharge Plan: Domiciliary (Rest home)    Current Diagnoses: Patient Active Problem List   Diagnosis Date Noted   Gastritis with hemorrhage 05/29/2024   CKD stage 3b, GFR 30-44 ml/min (HCC) 05/26/2024   Intractable nausea and vomiting 05/25/2024   Elevated LFTs 05/25/2024   Depression 05/25/2024   Type 2 diabetes mellitus without complications (HCC) 05/25/2024   Polyp of transverse colon 01/10/2024   Blood in stool 01/09/2024   BRBPR (bright red blood per rectum) 01/08/2024   Gastroenteritis 01/08/2024   Hemorrhagic shock (HCC) 10/31/2023   RSV (respiratory syncytial virus pneumonia) 10/31/2023   Hyperkalemia 10/31/2023   Hyponatremia 10/31/2023   Hematoma of right hip 10/28/2023   Acute respiratory failure with hypoxia (HCC) 02/01/2023   Paroxysmal atrial fibrillation (HCC) 01/24/2023   Pulmonary hypertension (HCC) 01/22/2023   Metabolic acidosis 01/22/2023   Fall 01/22/2023   Transaminitis 01/21/2023   Hypotension after procedure 01/21/2023   Shock (HCC) 01/21/2023   Closed fracture of right hip (HCC) 01/21/2023   Nausea 01/21/2023   Chronic heart failure with preserved ejection fraction (HCC) 01/01/2023   Restless leg syndrome 05/31/2022   Hypothyroidism 05/30/2022   Drop in hemoglobin 05/30/2022   Elevated liver function  tests 05/30/2022   History of gout 05/30/2022   Humerus fracture 05/29/2022   Abnormal urinalysis 03/05/2022   Hypoglycemia 03/04/2022   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/02/2022   Anemia in chronic kidney disease 03/02/2022   Congestive heart failure (HCC) 03/02/2022   Osteoporosis 02/13/2020   Primary osteoarthritis of left shoulder 11/10/2019   Uses walker 09/27/2018   UTI (urinary tract infection) 06/14/2018   Type 2 diabetes mellitus with renal manifestations (HCC) 05/02/2018   Chronic anticoagulation 04/09/2018   Cardiac pacemaker in situ 04/09/2018   H/O TIA (transient ischemic attack) and stroke 04/09/2018   CAD (coronary artery disease) 04/09/2018   Hypotension 02/04/2018   Sinus node dysfunction (HCC) 08/24/2017   Dizziness 06/12/2017   Nephrolithiasis 02/22/2016   Diabetic neuropathy with neurologic complication (HCC) 11/30/2015   Recurrent major depressive disorder, in remission (HCC) 08/11/2015   Polyp of stomach and duodenum 07/29/2015   Gastric polyp    Gastro-esophageal reflux disease without esophagitis    Epigastric pain    Permanent atrial fibrillation (HCC) 06/29/2015   Intestinal obstruction (HCC) 02/20/2015   DDD (degenerative disc disease), lumbar 01/19/2015   Back pain 01/12/2015   Gout 01/12/2015   Intervertebral disc disorder with radiculopathy of lumbar region 09/17/2014   Lumbar radiculitis 09/17/2014   Lumbar stenosis with neurogenic claudication 09/17/2014   Neuritis or radiculitis due to rupture of lumbar intervertebral disc 09/17/2014   Atherosclerosis of both carotid arteries 07/15/2014   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   Bilateral carotid artery disease (HCC) 07/15/2014   Body mass index (BMI) of 40.0-44.9 in adult (HCC) 06/21/2014   Mixed  urge and stress incontinence 06/25/2013   Nocturia 06/25/2013   Blurring of visual image 04/11/2012   Other visual disturbances 04/11/2012     Orientation RESPIRATION BLADDER Height & Weight     Self, Time, Situation, Place    Incontinent Weight: 70.3 kg Height:  5' 3 (160 cm)  BEHAVIORAL SYMPTOMS/MOOD NEUROLOGICAL BOWEL NUTRITION STATUS      Incontinent Diet (NPO)  AMBULATORY STATUS COMMUNICATION OF NEEDS Skin   Limited Assist Verbally                         Personal Care Assistance Level of Assistance  Feeding, Dressing, Bathing Bathing Assistance: Limited assistance Feeding assistance: Limited assistance Dressing Assistance: Limited assistance     Functional Limitations Info             SPECIAL CARE FACTORS FREQUENCY                       Contractures      Additional Factors Info  Code Status, Allergies Code Status Info: DNR Allergies Info: Fish Allergy, Macrolides And Ketolide, Meperidine,Prednisone ,Shellfish Allergy, Sulfa Antibiotics, Sulfacetamide Sodium, Telbivudine, Uloric (febuxostat),  Aspirin , Celecoxib,Cephalexin, Codeine Medium Intolerance Diarrhea, Nausea Only, Other (See Comments) Reaction:  Stomach pain Pt tolerates morphine   Dilaudid  (hydromorphone  Hcl) Medium Hypersensitivity Other (See Comments) Reactions: easy to overdose - blood pressure drops really low  Erythromycin Medium  Diarrhea, Nausea Only, Other (See Comments) Reaction:  Stomach pain  Iodinated Contrast Media Medium  Rash, Other (See Comments) Pt states that she is unable to have because she has chronic kidney disease.    Lipitor (atorvastatin Calcium ) Medium Hypersensitivity Nausea Only Reaction: nausea, weakness, pass blood  Oxycodone  Medium  Other (See Comments) Reaction:  Stomach pain  Pseudoephedrine Not Specified     Aleve (naproxen) Low Intolerance  Reaction: severe stomach pain  Atorvastatin Low  Nausea Only, Other (See Comments) Weakness  Cephalosporins Low Intolerance Diarrhea, Nausea Only, Other (See Comments) Other: abdominal pain Tolerated 1st generation cephalosporin (CEFAZOLIN ) on 06/02/2022 and 01/21/2023  without documented ADRs.  Doxycycline Low Intolerance  Stomach pain  Iodine Low  Rash   Motrin (ibuprofen) Low Intolerance  Reaction: severe stomach pain  Tape Low Hypersensitivity Other (See Comments) Causes pts skin to tear Pt states that she is able to use paper tape.    Valdecoxib Low  Swelling, Other (See Comments) Pt states that her hands and feet swell.           Current Medications (05/30/2024):  This is the current hospital active medication list Current Facility-Administered Medications  Medication Dose Route Frequency Provider Last Rate Last Admin   acetaminophen  (TYLENOL ) tablet 650 mg  650 mg Oral Q6H PRN Mansy, Jan A, MD   650 mg at 05/30/24 9150   Or   acetaminophen  (TYLENOL ) suppository 650 mg  650 mg Rectal Q6H PRN Mansy, Jan A, MD       albuterol  (PROVENTIL ) (2.5 MG/3ML) 0.083% nebulizer solution 2.5 mg  2.5 mg Inhalation Q6H PRN Mansy, Jan A, MD       allopurinol  (ZYLOPRIM ) tablet 100 mg  100 mg Oral q AM Mansy, Jan A, MD   100 mg at 05/30/24 0627   alum & mag hydroxide-simeth (MAALOX/MYLANTA) 200-200-20 MG/5ML suspension 10 mL  10 mL Oral TID PRN Mansy, Jan A, MD   10 mL at 05/29/24 9171   amiodarone  (PACERONE ) tablet 100 mg  100 mg Oral Daily Mansy, Madison LABOR, MD  100 mg at 05/30/24 0836   cholecalciferol  (VITAMIN D3) 25 MCG (1000 UNIT) tablet 2,000 Units  2,000 Units Oral q AM Mansy, Madison LABOR, MD   2,000 Units at 05/30/24 9373   dicyclomine  (BENTYL ) capsule 20 mg  20 mg Oral TID AC Sreenath, Sudheer B, MD   20 mg at 05/30/24 0836   enoxaparin  (LOVENOX ) injection 40 mg  40 mg Subcutaneous Q24H Elesa Perkins, RPH   40 mg at 05/30/24 9164   feeding supplement (BOOST / RESOURCE BREEZE) liquid 1 Container  1 Container Oral TID BM Wouk, Devaughn Sayres, MD   1 Container at 05/29/24 2016   fesoterodine  (TOVIAZ ) tablet 4 mg  4 mg Oral Daily Mansy, Jan A, MD   4 mg at 05/30/24 0836   furosemide  (LASIX ) tablet 20 mg  20 mg Oral Daily Mansy, Jan A, MD   20 mg at 05/30/24 0836    HYDROmorphone  (DILAUDID ) injection 1 mg  1 mg Intravenous Q4H PRN Mansy, Jan A, MD   1 mg at 05/27/24 1955   insulin  aspart (novoLOG ) injection 0-5 Units  0-5 Units Subcutaneous QHS Kandis Devaughn Sayres, MD   3 Units at 05/29/24 2218   insulin  aspart (novoLOG ) injection 0-9 Units  0-9 Units Subcutaneous TID WC Kandis Devaughn Sayres, MD   3 Units at 05/29/24 1709   insulin  glargine (LANTUS ) injection 8 Units  8 Units Subcutaneous Daily Jhonny Sahara B, MD       iron  polysaccharides (NIFEREX) capsule 150 mg  150 mg Oral Daily Mansy, Jan A, MD   150 mg at 05/30/24 0836   levothyroxine  (SYNTHROID ) tablet 37.5 mcg  37.5 mcg Oral Q0600 Mansy, Jan A, MD   37.5 mcg at 05/30/24 9372   loratadine  (CLARITIN ) tablet 10 mg  10 mg Oral Daily Mansy, Jan A, MD   10 mg at 05/30/24 9163   magnesium  hydroxide (MILK OF MAGNESIA) suspension 30 mL  30 mL Oral Daily PRN Mansy, Jan A, MD       methocarbamol  (ROBAXIN ) tablet 500 mg  500 mg Oral TID Sreenath, Sudheer B, MD   500 mg at 05/30/24 0836   multivitamin with minerals tablet 1 tablet  1 tablet Oral Daily Mansy, Jan A, MD   1 tablet at 05/30/24 0836   ondansetron  (ZOFRAN ) tablet 4 mg  4 mg Oral Q6H PRN Mansy, Jan A, MD       Or   ondansetron  (ZOFRAN ) injection 4 mg  4 mg Intravenous Q6H PRN Mansy, Jan A, MD   4 mg at 05/29/24 0737   pantoprazole  (PROTONIX ) injection 40 mg  40 mg Intravenous Q12H Kandis Devaughn Sayres, MD   40 mg at 05/30/24 9164   polyethylene glycol (MIRALAX  / GLYCOLAX ) packet 17 g  17 g Oral Daily Mansy, Jan A, MD   17 g at 05/30/24 9163   rOPINIRole  (REQUIP ) tablet 0.5 mg  0.5 mg Oral QHS Mansy, Jan A, MD   0.5 mg at 05/29/24 2215   senna (SENOKOT) tablet 8.6 mg  1 tablet Oral QHS PRN Mansy, Jan A, MD       sertraline  (ZOLOFT ) tablet 100 mg  100 mg Oral q AM Mansy, Jan A, MD   100 mg at 05/30/24 9372   sucralfate  (CARAFATE ) tablet 1 g  1 g Oral QID Mansy, Jan A, MD   1 g at 05/30/24 9163   thiamine  (VITAMIN B1) tablet 100 mg  100 mg Oral Daily  Mansy, Jan A, MD   100 mg  at 05/30/24 0836   traMADol  (ULTRAM ) tablet 50 mg  50 mg Oral Q6H PRN Kandis Devaughn Sayres, MD   50 mg at 05/29/24 2225   traZODone  (DESYREL ) tablet 25 mg  25 mg Oral QHS PRN Mansy, Jan A, MD   25 mg at 05/25/24 2249     Discharge Medications: Please see discharge summary for a list of discharge medications.  Relevant Imaging Results:  Relevant Lab Results:   Additional Information SS#: [id number]  Dalia GORMAN Fuse, RN

## 2024-05-30 NOTE — Progress Notes (Signed)
 Mobility Specialist - Progress Note     05/30/24 1147  Mobility  Activity Ambulated with assistance;Stood at bedside  Level of Assistance Modified independent, requires aide device or extra time  Assistive Device Front wheel walker  Distance Ambulated (ft) 90 ft  Range of Motion/Exercises Active  Activity Response Tolerated well  Mobility Referral Yes  Mobility visit 1 Mobility  Mobility Specialist Start Time (ACUTE ONLY) 1132  Mobility Specialist Stop Time (ACUTE ONLY) 1148  Mobility Specialist Time Calculation (min) (ACUTE ONLY) 16 min   Pt resting in bed on RA upon entry. Pt STS and ambulates to hallway around NS ModI with RW. Pt endorses pain in head RN notified. Pt returned to chair and left with needs in reach.   Guido Rumble Mobility Specialist 05/30/24, 11:49 AM

## 2024-05-30 NOTE — Progress Notes (Signed)
 PROGRESS NOTE    Deborah Obrien  FMW:[id number] DOB: [birth year] DOA: 05/25/2024 PCP: Cordella Corning, FNP    Brief Narrative:   From admission h and p   Deborah Obrien is a 83 y.o. Caucasian homeless female with medical history significant for anxiety, carotid artery stenosis, chronic heart failure with preserved EF, coronary artery disease, hypertension, and hypothyroidism, who presented to the emergency room with acute onset of dyspnea initially as well as recurrent nausea and vomiting with epigastric abdominal pain over the last 12 hours.  She was not dyspneic during my interview.  Despite given Zofran  at her SNF she continued to have recurrent vomiting.  No fever or chills.  No chest pain or palpitations.  No cough or wheezing or dyspnea.  No dysuria, oliguria or hematuria or flank pain.  9/17: Patient had recurrent dysphagia and abdominal pain.  GI reengaged.  EGD today 9/18: Status post EGD, tolerated well.  Hematin found in lower third of esophagus.  Diffuse moderate inflammation.  Biopsies taken.  Coagulation for hemostasis using APC   Assessment & Plan:   Principal Problem:   Intractable nausea and vomiting Active Problems:   Elevated LFTs   Hypothyroidism   Paroxysmal atrial fibrillation (HCC)   Type 2 diabetes mellitus without complications (HCC)   CAD (coronary artery disease)   Gout   Cardiac pacemaker in situ   Depression   CKD stage 3b, GFR 30-44 ml/min (HCC)   Gastritis with hemorrhage  # Epigastric abdominal pain # Nausea/vomiting One day acute onset of these symptoms. Emesis nbnb. CT abdomen unremarkable, though hiatal hernia seen on CT a week ago. No sig pathology seen on EGD earlier this year. Last apixaban  AM of 9/14. No report of bleeding. Does have mid stable transaminitis that is chronic, mildly elevated t bili that has normalized, is s/p cholecystectomy and ct and u/s do not show ductal dilation, doubt therefore obstructive process. Esophogram shows  mild esophageal dysmotility, possible small hiatal hernia. GI evaluated on 9/15 and symptoms thought to be msk, EGD deferred.  Subsequently patient developed severe abdominal pain associated with eating and dysphagia  -Status post EGD 9/17.  Tolerated well.  Hematin noted in the lower third of esophagus.  Multiple sessile polyps.  Biopsies taken.  Coagulation for hemostasis using APC. Plan:  Continue soft diet.  Continue current pain management including muscle relaxants and PPI.  Consider discharge in 24 hours if symptoms improved.  # Debility Resides at [facility] Will return on discharge Potential discharge 9/19   # A-fib Rate controlled Continue home amiodarone  Resume apixaban    # HFpEF Euvolemic Continue home Lasix    # Chronic pain Continue home tramadol  and gabapentin    # T2DM Hypoglycemic yesterday morning, resolved Will initiate Lantus  on return from EGD   # Hypothyroid Continue home synthroid    # restless leg Continue home requip    # GAD/MDD Continue home sertraline    # Gout Asymptomatic Continue home allopurinol    DVT prophylaxis: Lovenox  Code Status: DNR Family Communication: None today Disposition Plan: Status is: Inpatient Remains inpatient appropriate because: Dysphagia, abdominal pain of unclear etiology.  EGD today.   Level of care: Telemetry Medical  Consultants:  GI  Procedures:  EGD  Antimicrobials: None   Subjective: Seen and examined.  Resting in bed.  Endorses headache this morning.  Otherwise abdominal pain endorsed.  Slightly improved from prior.  Objective: Vitals:   05/29/24 1546 05/29/24 2012 05/30/24 0346 05/30/24 0857  BP: 122/60 (!) 114/59 (!) 114/53 ROLLEN)  111/48  Pulse: 63 62 63 (!) 59  Resp: 18 19 16 19   Temp: 98.2 F (36.8 C) 97.8 F (36.6 C) 97.9 F (36.6 C) 98.6 F (37 C)  TempSrc: Oral  Oral   SpO2: 100% 100% 99% 98%  Weight:      Height:        Intake/Output Summary (Last 24 hours) at  05/30/2024 1226 Last data filed at 05/30/2024 0900 Gross per 24 hour  Intake 340 ml  Output --  Net 340 ml   Filed Weights   05/25/24 2342  Weight: 70.3 kg    Examination:  General exam: Appears fatigued Respiratory system: Lungs clear.  Normal work of breathing.  Room air Cardiovascular system: S1-S2, RRR, no murmurs, no pedal edema Gastrointestinal system: Soft, mild TTP epigastrium, normal bowel sounds Central nervous system: Alert and oriented. No focal neurological deficits. Extremities: Symmetric 5 x 5 power. Skin: No rashes, lesions or ulcers Psychiatry: Judgement and insight appear normal. Mood & affect appropriate.     Data Reviewed: I have personally reviewed following labs and imaging studies  CBC: Recent Labs  Lab 05/25/24 1734 05/26/24 0419 05/27/24 0319 05/28/24 0352 05/29/24 0430 05/30/24 0528  WBC 7.2 3.7* 3.4* 4.3 4.3 4.8  NEUTROABS 6.2  --   --   --   --   --   HGB 14.1 10.7* 11.7* 12.0 12.0 12.2  HCT 44.0 32.7* 35.9* 36.5 36.9 36.3  MCV 101.6* 100.0 100.6* 100.0 98.7 97.3  PLT 98* 115* 129* 141* 153 161   Basic Metabolic Panel: Recent Labs  Lab 05/25/24 1734 05/26/24 0419 05/27/24 0319 05/28/24 0352 05/29/24 0430 05/30/24 0528  NA 137 138 142 139 137 135  K 4.8 4.3 4.0 4.2 5.0 4.2  CL 103 107 109 103 100 95*  CO2 21* 23 25 28 29 27   GLUCOSE 337* 253* 57* 150* 199* 224*  BUN 57* 46* 36* 27* 27* 34*  CREATININE 1.63* 1.37* 1.22* 1.21* 1.28* 1.47*  CALCIUM  8.8* 8.0* 8.5* 8.7* 8.8* 9.0  MG 1.9  --   --   --   --   --    GFR: Estimated Creatinine Clearance: 27.3 mL/min (A) (by C-G formula based on SCr of 1.47 mg/dL (H)). Liver Function Tests: Recent Labs  Lab 05/26/24 0419 05/27/24 0319 05/28/24 0352 05/29/24 0430 05/30/24 0528  AST 87* 80* 71* 61* 55*  ALT 64* 69* 62* 56* 53*  ALKPHOS 113 129* 133* 131* 125  BILITOT 1.2 0.8 0.9 1.0 1.4*  PROT 5.9* 6.7 6.6 6.5 6.7  ALBUMIN  3.0* 3.5 3.4* 3.4* 3.7   Recent Labs  Lab  05/25/24 1734  LIPASE 30   No results for input(s): AMMONIA in the last 168 hours. Coagulation Profile: No results for input(s): INR, PROTIME in the last 168 hours. Cardiac Enzymes: No results for input(s): CKTOTAL, CKMB, CKMBINDEX, TROPONINI in the last 168 hours. BNP (last 3 results) No results for input(s): PROBNP in the last 8760 hours. HbA1C: No results for input(s): HGBA1C in the last 72 hours. CBG: Recent Labs  Lab 05/29/24 1548 05/29/24 1658 05/29/24 2012 05/30/24 0856 05/30/24 1200  GLUCAP 120* 232* 249* 279* 217*   Lipid Profile: No results for input(s): CHOL, HDL, LDLCALC, TRIG, CHOLHDL, LDLDIRECT in the last 72 hours. Thyroid  Function Tests: No results for input(s): TSH, T4TOTAL, FREET4, T3FREE, THYROIDAB in the last 72 hours. Anemia Panel: No results for input(s): VITAMINB12, FOLATE, FERRITIN, TIBC, IRON , RETICCTPCT in the last 72 hours. Sepsis Labs: No results  for input(s): PROCALCITON, LATICACIDVEN in the last 168 hours.  Recent Results (from the past 240 hours)  Resp panel by RT-PCR (RSV, Flu A&B, Covid) Anterior Nasal Swab     Status: None   Collection Time: 05/25/24  5:35 PM   Specimen: Anterior Nasal Swab  Result Value Ref Range Status   SARS Coronavirus 2 by RT PCR NEGATIVE NEGATIVE Final    Comment: (NOTE) SARS-CoV-2 target nucleic acids are NOT DETECTED.  The SARS-CoV-2 RNA is generally detectable in upper respiratory specimens during the acute phase of infection. The lowest concentration of SARS-CoV-2 viral copies this assay can detect is 138 copies/mL. A negative result does not preclude SARS-Cov-2 infection and should not be used as the sole basis for treatment or other patient management decisions. A negative result may occur with  improper specimen collection/handling, submission of specimen other than nasopharyngeal swab, presence of viral mutation(s) within the areas targeted by this  assay, and inadequate number of viral copies(<138 copies/mL). A negative result must be combined with clinical observations, patient history, and epidemiological information. The expected result is Negative.  Fact Sheet for Patients:  BloggerCourse.com  Fact Sheet for Healthcare Providers:  SeriousBroker.it  This test is no t yet approved or cleared by the [place]  FDA and  has been authorized for detection and/or diagnosis of SARS-CoV-2 by FDA under an Emergency Use Authorization (EUA). This EUA will remain  in effect (meaning this test can be used) for the duration of the COVID-19 declaration under Section 564(b)(1) of the Act, 21 U.S.C.section 360bbb-3(b)(1), unless the authorization is terminated  or revoked sooner.       Influenza A by PCR NEGATIVE NEGATIVE Final   Influenza B by PCR NEGATIVE NEGATIVE Final    Comment: (NOTE) The Xpert Xpress SARS-CoV-2/FLU/RSV plus assay is intended as an aid in the diagnosis of influenza from Nasopharyngeal swab specimens and should not be used as a sole basis for treatment. Nasal washings and aspirates are unacceptable for Xpert Xpress SARS-CoV-2/FLU/RSV testing.  Fact Sheet for Patients: BloggerCourse.com  Fact Sheet for Healthcare Providers: SeriousBroker.it  This test is not yet approved or cleared by the [place]  FDA and has been authorized for detection and/or diagnosis of SARS-CoV-2 by FDA under an Emergency Use Authorization (EUA). This EUA will remain in effect (meaning this test can be used) for the duration of the COVID-19 declaration under Section 564(b)(1) of the Act, 21 U.S.C. section 360bbb-3(b)(1), unless the authorization is terminated or revoked.     Resp Syncytial Virus by PCR NEGATIVE NEGATIVE Final    Comment: (NOTE) Fact Sheet for Patients: BloggerCourse.com  Fact Sheet for  Healthcare Providers: SeriousBroker.it  This test is not yet approved or cleared by the [place]  FDA and has been authorized for detection and/or diagnosis of SARS-CoV-2 by FDA under an Emergency Use Authorization (EUA). This EUA will remain in effect (meaning this test can be used) for the duration of the COVID-19 declaration under Section 564(b)(1) of the Act, 21 U.S.C. section 360bbb-3(b)(1), unless the authorization is terminated or revoked.  Performed at [facility], [address], [place], [place] [address]          Radiology Studies: No results found.      Scheduled Meds:  allopurinol   100 mg Oral q AM   amiodarone   100 mg Oral Daily   cholecalciferol   2,000 Units Oral q AM   dicyclomine   20 mg Oral TID AC   [START ON 05/31/2024] enoxaparin  (LOVENOX ) injection  30 mg Subcutaneous Q24H   feeding supplement  1 Container Oral TID BM   fesoterodine   4 mg Oral Daily   furosemide   20 mg Oral Daily   insulin  aspart  0-5 Units Subcutaneous QHS   insulin  aspart  0-9 Units Subcutaneous TID WC   insulin  glargine  8 Units Subcutaneous Daily   iron  polysaccharides  150 mg Oral Daily   levothyroxine   37.5 mcg Oral Q0600   loratadine   10 mg Oral Daily   methocarbamol   500 mg Oral TID   multivitamin with minerals  1 tablet Oral Daily   pantoprazole  (PROTONIX ) IV  40 mg Intravenous Q12H   polyethylene glycol  17 g Oral Daily   rOPINIRole   0.5 mg Oral QHS   sertraline   100 mg Oral q AM   sucralfate   1 g Oral QID   thiamine   100 mg Oral Daily   Continuous Infusions:   LOS: 5 days     Deborah KATHEE Robson, MD [facility]   If 7PM-7AM, please contact night-coverage  05/30/2024, 12:26 PM

## 2024-05-30 NOTE — Plan of Care (Signed)

## 2024-05-30 NOTE — Progress Notes (Signed)
 Deborah Copping, MD [facility]   [address] [place], [place] [address] Phone: [phone] Fax : [phone]   Subjective: The patient is sitting up in bed and tolerating food without any issues.  The patient states that she is eating better and has less pain this morning.  She does report that she woke up with a headache that has also gotten better.   Objective: Vital signs in last 24 hours: Vitals:   05/29/24 1546 05/29/24 2012 05/30/24 0346 05/30/24 0857  BP: 122/60 (!) 114/59 (!) 114/53 (!) 111/48  Pulse: 63 62 63 (!) 59  Resp: 18 19 16 19   Temp: 98.2 F (36.8 C) 97.8 F (36.6 C) 97.9 F (36.6 C) 98.6 F (37 C)  TempSrc: Oral  Oral   SpO2: 100% 100% 99% 98%  Weight:      Height:       Weight change:   Intake/Output Summary (Last 24 hours) at 05/30/2024 1459 Last data filed at 05/29/2024 2320 Gross per 24 hour  Intake 660 ml  Output --  Net 660 ml     Exam: General: Patient sitting up in a chair in no apparent distress tolerating her diet.   Lab Results: @LABTEST2 @ Micro Results: Recent Results (from the past 240 hours)  Resp panel by RT-PCR (RSV, Flu A&B, Covid) Anterior Nasal Swab     Status: None   Collection Time: 05/25/24  5:35 PM   Specimen: Anterior Nasal Swab  Result Value Ref Range Status   SARS Coronavirus 2 by RT PCR NEGATIVE NEGATIVE Final    Comment: (NOTE) SARS-CoV-2 target nucleic acids are NOT DETECTED.  The SARS-CoV-2 RNA is generally detectable in upper respiratory specimens during the acute phase of infection. The lowest concentration of SARS-CoV-2 viral copies this assay can detect is 138 copies/mL. A negative result does not preclude SARS-Cov-2 infection and should not be used as the sole basis for treatment or other patient management decisions. A negative result may occur with  improper specimen collection/handling, submission of specimen other than nasopharyngeal swab, presence of viral mutation(s) within the areas  targeted by this assay, and inadequate number of viral copies(<138 copies/mL). A negative result must be combined with clinical observations, patient history, and epidemiological information. The expected result is Negative.  Fact Sheet for Patients:  BloggerCourse.com  Fact Sheet for Healthcare Providers:  SeriousBroker.it  This test is no t yet approved or cleared by the [place]  FDA and  has been authorized for detection and/or diagnosis of SARS-CoV-2 by FDA under an Emergency Use Authorization (EUA). This EUA will remain  in effect (meaning this test can be used) for the duration of the COVID-19 declaration under Section 564(b)(1) of the Act, 21 U.S.C.section 360bbb-3(b)(1), unless the authorization is terminated  or revoked sooner.       Influenza A by PCR NEGATIVE NEGATIVE Final   Influenza B by PCR NEGATIVE NEGATIVE Final    Comment: (NOTE) The Xpert Xpress SARS-CoV-2/FLU/RSV plus assay is intended as an aid in the diagnosis of influenza from Nasopharyngeal swab specimens and should not be used as a sole basis for treatment. Nasal washings and aspirates are unacceptable for Xpert Xpress SARS-CoV-2/FLU/RSV testing.  Fact Sheet for Patients: BloggerCourse.com  Fact Sheet for Healthcare Providers: SeriousBroker.it  This test is not yet approved or cleared by the [place]  FDA and has been authorized for detection and/or diagnosis of SARS-CoV-2 by FDA under an Emergency Use Authorization (EUA). This EUA will remain in effect (meaning this  test can be used) for the duration of the COVID-19 declaration under Section 564(b)(1) of the Act, 21 U.S.C. section 360bbb-3(b)(1), unless the authorization is terminated or revoked.     Resp Syncytial Virus by PCR NEGATIVE NEGATIVE Final    Comment: (NOTE) Fact Sheet for  Patients: BloggerCourse.com  Fact Sheet for Healthcare Providers: SeriousBroker.it  This test is not yet approved or cleared by the [place]  FDA and has been authorized for detection and/or diagnosis of SARS-CoV-2 by FDA under an Emergency Use Authorization (EUA). This EUA will remain in effect (meaning this test can be used) for the duration of the COVID-19 declaration under Section 564(b)(1) of the Act, 21 U.S.C. section 360bbb-3(b)(1), unless the authorization is terminated or revoked.  Performed at [facility], [address] [place], [place] [address]    Studies/Results: No results found. Medications: I have reviewed the patient's current medications. Scheduled Meds:  allopurinol   100 mg Oral q AM   amiodarone   100 mg Oral Daily   cholecalciferol   2,000 Units Oral q AM   dicyclomine   20 mg Oral TID AC   [START ON 05/31/2024] enoxaparin  (LOVENOX ) injection  30 mg Subcutaneous Q24H   feeding supplement  1 Container Oral TID BM   fesoterodine   4 mg Oral Daily   fluticasone   1 spray Each Nare Daily   furosemide   20 mg Oral Daily   gabapentin   100 mg Oral TID   insulin  aspart  0-5 Units Subcutaneous QHS   insulin  aspart  0-9 Units Subcutaneous TID WC   insulin  glargine  8 Units Subcutaneous Daily   iron  polysaccharides  150 mg Oral Daily   levothyroxine   37.5 mcg Oral Q0600   loratadine   10 mg Oral Daily   methocarbamol   500 mg Oral TID   midodrine   5 mg Oral TID WC   multivitamin with minerals  1 tablet Oral Daily   pantoprazole  (PROTONIX ) IV  40 mg Intravenous Q12H   polyethylene glycol  17 g Oral Daily   rOPINIRole   0.5 mg Oral QHS   sertraline   100 mg Oral q AM   sucralfate   1 g Oral QID   thiamine   100 mg Oral Daily   Continuous Infusions: PRN Meds:.acetaminophen  **OR** acetaminophen , albuterol , alum & mag hydroxide-simeth, HYDROmorphone  (DILAUDID ) injection, magnesium  hydroxide, ondansetron  **OR**  ondansetron  (ZOFRAN ) IV, senna, traMADol , traZODone    Assessment: Principal Problem:   Intractable nausea and vomiting Active Problems:   Gout   Cardiac pacemaker in situ   CAD (coronary artery disease)   Hypothyroidism   Paroxysmal atrial fibrillation (HCC)   Elevated LFTs   Depression   Type 2 diabetes mellitus without complications (HCC)   CKD stage 3b, GFR 30-44 ml/min (HCC)   Gastritis with hemorrhage    Plan: This patient has been seen for dysphagia with an EGD showing no obstruction but some gastric polyps 1 of which was treated with argon plasma coagulation due to it oozing blood.  There was also some blood in the esophagus seen from that bleeding polyp.  The patient is doing better today.  The patient does not need any further GI workup and had no strictures or narrowing seen in the esophagus.  I will sign off.  Please call if any further GI concerns or questions.  We would like to thank you for the opportunity to participate in the care of Orlean GORMAN Finder.    LOS: 5 days   Deborah Copping, MD.FACG 05/30/2024, 2:59 PM Pager [phone] 7am-5pm  Check AMION for 5pm -7am coverage and on weekends

## 2024-05-30 NOTE — Plan of Care (Signed)
  Problem: Health Behavior/Discharge Planning: Goal: Ability to manage health-related needs will improve Outcome: Progressing   Problem: Clinical Measurements: Goal: Will remain free from infection Outcome: Progressing Goal: Cardiovascular complication will be avoided Outcome: Progressing   Problem: Coping: Goal: Level of anxiety will decrease Outcome: Progressing

## 2024-05-30 NOTE — TOC Progression Note (Addendum)
 Transition of Care [facility]) - Progression Note    Patient Details  Name: Deborah Obrien MRN: [id number] Date of Birth: June 21, 1941  [facility] CM/SW Contact  Dalia GORMAN Fuse, RN Phone Number: 05/30/2024, 9:06 AM  Clinical Narrative:     Patient is s/p upper endoscopy with biopsies pending. Therapy recommending SNF when medically appropriate. Plan is for patient to return to [facility] when medically appropriate.   16:00 TOC spoke with nurse at [facility]. The patient can return to the facility when medically ready for discharge. Please email FL2 and discharge summary to the facility at [email or web address].                 Expected Discharge Plan and Services                                               Social Drivers of Health (SDOH) Interventions SDOH Screenings   Food Insecurity: No Food Insecurity (05/25/2024)  Housing: Low Risk  (05/25/2024)  Transportation Needs: No Transportation Needs (05/25/2024)  Utilities: Not At Risk (05/25/2024)  Financial Resource Strain: Low Risk  (05/23/2024)   Received from [facility]  Physical Activity: Unknown (02/19/2019)   Received from [facility]  Social Connections: Socially Integrated (05/25/2024)  Stress: Stress Concern Present (02/19/2019)   Received from [facility]  Tobacco Use: Low Risk  (05/29/2024)    Readmission Risk Interventions    05/27/2024   10:43 AM 01/10/2024    9:49 AM 10/30/2023    4:25 PM  Readmission Risk Prevention Plan  Transportation Screening Complete  Complete  Medication Review (RN Care Manager)   Complete  PCP or Specialist appointment within 3-5 days of discharge Complete Complete Complete  HRI or Home Care Consult Complete Complete Complete  SW Recovery Care/Counseling Consult Complete Complete   Palliative Care Screening Not Applicable Not Applicable Complete  Skilled Nursing Facility Not  Applicable Not Applicable Complete

## 2024-05-31 DIAGNOSIS — R112 Nausea with vomiting, unspecified: Secondary | ICD-10-CM | POA: Diagnosis not present

## 2024-05-31 LAB — URINALYSIS, COMPLETE (UACMP) WITH MICROSCOPIC
Bilirubin Urine: NEGATIVE
Glucose, UA: NEGATIVE mg/dL
Hgb urine dipstick: NEGATIVE
Ketones, ur: NEGATIVE mg/dL
Leukocytes,Ua: NEGATIVE
Nitrite: NEGATIVE
Protein, ur: NEGATIVE mg/dL
Specific Gravity, Urine: 1.013 (ref 1.005–1.030)
pH: 5 (ref 5.0–8.0)

## 2024-05-31 LAB — COMPREHENSIVE METABOLIC PANEL WITH GFR
ALT: 42 U/L (ref 0–44)
AST: 50 U/L — ABNORMAL HIGH (ref 15–41)
Albumin: 3.4 g/dL — ABNORMAL LOW (ref 3.5–5.0)
Alkaline Phosphatase: 123 U/L (ref 38–126)
Anion gap: 9 (ref 5–15)
BUN: 46 mg/dL — ABNORMAL HIGH (ref 8–23)
CO2: 28 mmol/L (ref 22–32)
Calcium: 9 mg/dL (ref 8.9–10.3)
Chloride: 99 mmol/L (ref 98–111)
Creatinine, Ser: 1.75 mg/dL — ABNORMAL HIGH (ref 0.44–1.00)
GFR, Estimated: 29 mL/min — ABNORMAL LOW (ref >60)
Glucose, Bld: 159 mg/dL — ABNORMAL HIGH (ref 70–99)
Potassium: 4.6 mmol/L (ref 3.5–5.1)
Sodium: 136 mmol/L (ref 135–145)
Total Bilirubin: 1.3 mg/dL — ABNORMAL HIGH (ref 0.0–1.2)
Total Protein: 6.7 g/dL (ref 6.5–8.1)

## 2024-05-31 LAB — CBC
HCT: 36.2 % (ref 36.0–46.0)
Hemoglobin: 11.9 g/dL — ABNORMAL LOW (ref 12.0–15.0)
MCH: 32.4 pg (ref 26.0–34.0)
MCHC: 32.9 g/dL (ref 30.0–36.0)
MCV: 98.6 fL (ref 80.0–100.0)
Platelets: 156 K/uL (ref 150–400)
RBC: 3.67 MIL/uL — ABNORMAL LOW (ref 3.87–5.11)
RDW: 12 % (ref 11.5–15.5)
WBC: 5.6 K/uL (ref 4.0–10.5)
nRBC: 0 % (ref 0.0–0.2)

## 2024-05-31 LAB — GLUCOSE, CAPILLARY
Glucose-Capillary: 152 mg/dL — ABNORMAL HIGH (ref 70–99)
Glucose-Capillary: 219 mg/dL — ABNORMAL HIGH (ref 70–99)
Glucose-Capillary: 262 mg/dL — ABNORMAL HIGH (ref 70–99)

## 2024-05-31 MED ORDER — METHOCARBAMOL 500 MG PO TABS: 500.0000 mg | ORAL_TABLET | Freq: Three times a day (TID) | ORAL | Status: AC

## 2024-05-31 MED ORDER — PANTOPRAZOLE SODIUM 40 MG PO TBEC: 40.0000 mg | DELAYED_RELEASE_TABLET | Freq: Two times a day (BID) | ORAL | Status: AC

## 2024-05-31 MED ORDER — CIPROFLOXACIN HCL 500 MG PO TABS
500.0000 mg | ORAL_TABLET | Freq: Every day | ORAL | Status: DC
  Administered 2024-05-31: 500 mg via ORAL
  Filled 2024-05-31: qty 1

## 2024-05-31 MED ORDER — CIPROFLOXACIN HCL 500 MG PO TABS: 500.0000 mg | ORAL_TABLET | Freq: Two times a day (BID) | ORAL | Status: AC

## 2024-05-31 MED ORDER — DICYCLOMINE HCL 10 MG PO CAPS: 20.0000 mg | ORAL_CAPSULE | Freq: Three times a day (TID) | ORAL | Status: AC

## 2024-05-31 NOTE — Plan of Care (Signed)
  Problem: Clinical Measurements: Goal: Ability to maintain clinical measurements within normal limits will improve Outcome: Progressing Goal: Diagnostic test results will improve Outcome: Progressing   Problem: Activity: Goal: Risk for activity intolerance will decrease Outcome: Progressing   Problem: Coping: Goal: Level of anxiety will decrease Outcome: Progressing

## 2024-05-31 NOTE — Progress Notes (Signed)
 Mobility Specialist - Progress Note    05/31/24 1100  Mobility  Activity Ambulated with assistance;Stood at bedside  Level of Assistance Modified independent, requires aide device or extra time  Assistive Device Front wheel walker  Distance Ambulated (ft) 160 ft  Range of Motion/Exercises Active  Activity Response Tolerated well  Mobility Referral Yes  Mobility visit 1 Mobility  Mobility Specialist Start Time (ACUTE ONLY) 1020  Mobility Specialist Stop Time (ACUTE ONLY) 1036  Mobility Specialist Time Calculation (min) (ACUTE ONLY) 16 min

## 2024-05-31 NOTE — Discharge Summary (Signed)
 Physician Discharge Summary  Deborah Obrien FMW:[id number] DOB: 08-30-1941 DOA: 05/25/2024  PCP: Cordella Corning, FNP  Admit date: 05/25/2024 Discharge date: 05/31/2024  Admitted From: SNF Disposition:  SNF  Recommendations for Outpatient Follow-up:  Follow up with PCP in 1-2 weeks   Home Health:No  Equipment/Devices:None   Discharge Condition:Stable  CODE STATUS:FULL  Diet recommendation: Reg  Brief/Interim Summary:  Deborah Obrien is a 83 y.o. Caucasian homeless female with medical history significant for anxiety, carotid artery stenosis, chronic heart failure with preserved EF, coronary artery disease, hypertension, and hypothyroidism, who presented to the emergency room with acute onset of dyspnea initially as well as recurrent nausea and vomiting with epigastric abdominal pain over the last 12 hours.  She was not dyspneic during my interview.  Despite given Zofran  at her SNF she continued to have recurrent vomiting.  No fever or chills.  No chest pain or palpitations.  No cough or wheezing or dyspnea.  No dysuria, oliguria or hematuria or flank pain.   9/17: Patient had recurrent dysphagia and abdominal pain.  GI reengaged.  EGD today 9/18: Status post EGD, tolerated well.  Hematin found in lower third of esophagus.  Diffuse moderate inflammation.  Biopsies taken.  Coagulation for hemostasis using APC 9/19: Abd pain improved.  Stable for return to [facility]    Discharge Diagnoses:  Principal Problem:   Intractable nausea and vomiting Active Problems:   Elevated LFTs   Hypothyroidism   Paroxysmal atrial fibrillation (HCC)   Type 2 diabetes mellitus without complications (HCC)   CAD (coronary artery disease)   Gout   Cardiac pacemaker in situ   Depression   CKD stage 3b, GFR 30-44 ml/min (HCC)   Gastritis with hemorrhage  # Epigastric abdominal pain # Nausea/vomiting One day acute onset of these symptoms. Emesis nbnb. CT abdomen unremarkable, though hiatal hernia seen  on CT a week ago. No sig pathology seen on EGD earlier this year. Last apixaban  AM of 9/14. No report of bleeding. Does have mid stable transaminitis that is chronic, mildly elevated t bili that has normalized, is s/p cholecystectomy and ct and u/s do not show ductal dilation, doubt therefore obstructive process. Esophogram shows mild esophageal dysmotility, possible small hiatal hernia. GI evaluated on 9/15 and symptoms thought to be msk, EGD deferred.  Subsequently patient developed severe abdominal pain associated with eating and dysphagia  -Status post EGD 9/17.  Tolerated well.  Hematin noted in the lower third of esophagus.  Multiple sessile polyps.  Biopsies taken.  Coagulation for hemostasis using APC. Plan:  OK for diet.  Recommend 5-6 small meals daily Added BID protonix , QID bentyl , TID robaxin  to med regimen Re-evaluate for medication need as outpatient   # Debility Resides at [facility] Will return on discharge Potential discharge 9/19   # A-fib Rate controlled Continue home amiodarone  Resume apixaban    # HFpEF Euvolemic Continue home Lasix    # Chronic pain Continue home tramadol  and gabapentin    # T2DM Resume home regimen   # Hypothyroid Continue home synthroid    # restless leg Continue home requip    # GAD/MDD Continue home sertraline    # Gout Asymptomatic Continue home allopurinol   Discharge Instructions  Discharge Instructions     Diet - low sodium heart healthy   Complete by: As directed    Increase activity slowly   Complete by: As directed       Allergies as of 05/31/2024       Reactions   Fish Allergy  Shortness Of Breath   All kinds of fish. Severe vomitting even with smell of fish.    Macrolides And Ketolides Other (See Comments)   Severe stomach pain (mycins)   Meperidine Shortness Of Breath, Nausea Only, Other (See Comments)   Stomach pain    Other Nausea Only, Swelling, Rash, Anaphylaxis, Diarrhea, Shortness Of Breath,  Other (See Comments)   Allergen: NON-STEROIDS, bextra - hands and feet swell Reaction:  Stomach pain   Other Reaction: Intolerance   Prednisone Anaphylaxis, Other (See Comments)   (facial swelling/redness/burning)Increases pts blood sugar  Pt states that she is allergic to all steroids.     Shellfish Allergy Anaphylaxis, Shortness Of Breath, Diarrhea, Nausea Only, Rash   Stomach pain    Sulfa Antibiotics Shortness Of Breath, Diarrhea, Nausea Only, Other (See Comments)   Reaction:  Stomach pain    Sulfacetamide Sodium Diarrhea, Nausea Only, Other (See Comments), Shortness Of Breath   Reaction:  Stomach pain    Telbivudine Other (See Comments)   Unknown reaction   Uloric [febuxostat] Anaphylaxis   Locks pt's body up   Aspirin  Rash, Other (See Comments)   Stomach pain (tolerates lower doses)   Celecoxib Other (See Comments)   GI bleed, weakness, and stomach pain.     Cephalexin Diarrhea, Nausea Only, Other (See Comments)   stomach pain    Codeine Diarrhea, Nausea Only, Other (See Comments)   Reaction:  Stomach pain Pt tolerates morphine     Dilaudid  [hydromorphone  Hcl] Other (See Comments)   Reactions: easy to overdose - blood pressure drops really low   Erythromycin Diarrhea, Nausea Only, Other (See Comments)   Reaction:  Stomach pain    Iodinated Contrast Media Rash, Other (See Comments)   Pt states that she is unable to have because she has chronic kidney disease.     Lipitor [atorvastatin Calcium ] Nausea Only   Reaction: nausea, weakness, pass blood   Oxycodone  Other (See Comments)   Reaction:  Stomach pain    Pseudoephedrine    Aleve [naproxen]    Reaction: severe stomach pain   Atorvastatin Nausea Only, Other (See Comments)   Weakness    Cephalosporins Diarrhea, Nausea Only, Other (See Comments)   Other: abdominal pain Tolerated 1st generation cephalosporin (CEFAZOLIN ) on 06/02/2022 and 01/21/2023 without documented ADRs.    Doxycycline    Stomach pain    Iodine Rash    Motrin [ibuprofen]    Reaction: severe stomach pain   Tape Other (See Comments)   Causes pts skin to tear  Pt states that she is able to use paper tape.     Valdecoxib Swelling, Other (See Comments)   Pt states that her hands and feet swell.          Medication List     STOP taking these medications    Claritin  10 MG tablet Generic drug: loratadine    iron  polysaccharides 150 MG capsule Commonly known as: NIFEREX   levothyroxine  75 MCG tablet Commonly known as: SYNTHROID    thiamine  100 MG tablet Commonly known as: Vitamin B-1   traMADol  50 MG tablet Commonly known as: ULTRAM    Vitamin D2 50 MCG (2000 UT) Tabs       TAKE these medications    acetaminophen  500 MG tablet Commonly known as: TYLENOL  Take 2 tablets (1,000 mg total) by mouth every 8 (eight) hours.   albuterol  108 (90 Base) MCG/ACT inhaler Commonly known as: VENTOLIN  HFA Inhale 2 puffs into the lungs every 6 (six) hours as needed for wheezing  or shortness of breath (cough).   allopurinol  100 MG tablet Commonly known as: ZYLOPRIM  Take 100 mg by mouth in the morning.   alum & mag hydroxide-simeth 200-200-20 MG/5ML suspension Commonly known as: MAALOX/MYLANTA Take 10 mLs by mouth 3 (three) times daily as needed for indigestion or heartburn.   amiodarone  200 MG tablet Commonly known as: PACERONE  Take 0.5 tablets (100 mg total) by mouth daily. What changed: how much to take   apixaban  2.5 MG Tabs tablet Commonly known as: Eliquis  Take 1 tablet (2.5 mg total) by mouth 2 (two) times daily.   dicyclomine  10 MG capsule Commonly known as: BENTYL  Take 2 capsules (20 mg total) by mouth 3 (three) times daily before meals.   docusate sodium  100 MG capsule Commonly known as: COLACE Take 100 mg by mouth 2 (two) times daily.   famotidine  10 MG tablet Commonly known as: PEPCID  Take 2 tablets (20 mg total) by mouth 2 (two) times daily. What changed:  how much to take when to take this   FLONASE   SENSIMIST NA Place 1 spray into the nose in the morning.   furosemide  20 MG tablet Commonly known as: LASIX  Take 1 tablet (20 mg total) by mouth daily.   gabapentin  100 MG capsule Commonly known as: NEURONTIN  Take 100 mg by mouth 3 (three) times daily.   ibandronate 150 MG tablet Commonly known as: BONIVA Take 150 mg by mouth every 30 (thirty) days. Take in the morning with a full glass of water , on an empty stomach, and do not take anything else by mouth or lie down for the next 30 min.   Lantus  100 UNIT/ML injection Generic drug: insulin  glargine Inject 0.19 mLs (19 Units total) into the skin at bedtime. What changed:  how much to take when to take this   magnesium  hydroxide 400 MG/5ML suspension Commonly known as: MILK OF MAGNESIA Take 30 mLs by mouth daily as needed (constipation).   methocarbamol  500 MG tablet Commonly known as: ROBAXIN  Take 1 tablet (500 mg total) by mouth 3 (three) times daily.   midodrine  5 MG tablet Commonly known as: PROAMATINE  Take by mouth 3 (three) times daily with meals. *DO NOT LIE DOWN AFTER TAKING, DO NOT TAKE AFTER EVENING MEAL. TAKE LAST DOSE OF THE DAY AT LEAST 4HOURS BEFORE BEDTIME*   Mucinex  Maximum Strength 1200 MG Tb12 Generic drug: Guaifenesin  Take 1 tablet by mouth 2 (two) times daily.   multivitamin with minerals Tabs tablet Take 1 tablet by mouth daily.   NovoLIN R FlexPen ReliOn 100 UNIT/ML FlexPen Generic drug: Insulin  Regular Human Inject 1-10 Units into the skin 3 (three) times daily with meals. Inject subcutaneously Sig: CHECK SUGARS BEFORE MEAL TIME & TAKE INSULIN  BASED ON SLIDING SCALE: <70=NO INSULIN , EAT A SNACK AND RECHECK SUGAR SOON. 70-120=0U, 121-150=2U, 151-200=4U, 201-250=5U, 251-300=6U, 301-350=8U, 351-400=10U, >400=CALL MD   nystatin  powder Apply 1 Application topically in the morning and at bedtime. (0800 & 2000) Apply topically to groin   ondansetron  4 MG disintegrating tablet Commonly known as:  ZOFRAN -ODT Take 1 tablet (4 mg total) by mouth every 8 (eight) hours as needed for nausea or vomiting.   pantoprazole  40 MG tablet Commonly known as: PROTONIX  Take 1 tablet (40 mg total) by mouth 2 (two) times daily before a meal. What changed: when to take this   polyethylene glycol 17 g packet Commonly known as: MIRALAX  / GLYCOLAX  Take 17 g by mouth daily.   Polyvinyl Alcohol -Povidone PF 1.4-0.6 % Soln Place 1 drop  into both eyes in the morning and at bedtime.   rOPINIRole  0.5 MG tablet Commonly known as: REQUIP  Take 0.5 mg by mouth at bedtime. (2000)   senna 8.6 MG tablet Commonly known as: SENOKOT Take 1 tablet (8.6 mg total) by mouth at bedtime as needed for constipation.   sertraline  100 MG tablet Commonly known as: ZOLOFT  Take 100 mg by mouth in the morning. (0800)   solifenacin 5 MG tablet Commonly known as: VESICARE Take 5 mg by mouth in the morning. (0800)   sucralfate  1 g tablet Commonly known as: Carafate  Take 1 tablet (1 g total) by mouth 4 (four) times daily.   Vitamin D  50 MCG (2000 UT) Caps Take 2,000 Units by mouth in the morning.        Follow-up Information     Cordella Corning, FNP Follow up.   Specialty: Family Medicine Why: hospital follow up Contact information: [address] [place] [address] [phone]                Allergies  Allergen Reactions   Fish Allergy Shortness Of Breath    All kinds of fish. Severe vomitting even with smell of fish.    Macrolides And Ketolides Other (See Comments)    Severe stomach pain (mycins)   Meperidine Shortness Of Breath, Nausea Only and Other (See Comments)    Stomach pain    Other Nausea Only, Swelling, Rash, Anaphylaxis, Diarrhea, Shortness Of Breath and Other (See Comments)    Allergen: NON-STEROIDS, bextra - hands and feet swell  Reaction:  Stomach pain   Other Reaction: Intolerance   Prednisone Anaphylaxis and Other (See Comments)    (facial  swelling/redness/burning)Increases pts blood sugar  Pt states that she is allergic to all steroids.     Shellfish Allergy Anaphylaxis, Shortness Of Breath, Diarrhea, Nausea Only and Rash    Stomach pain    Sulfa Antibiotics Shortness Of Breath, Diarrhea, Nausea Only and Other (See Comments)    Reaction:  Stomach pain     Sulfacetamide Sodium Diarrhea, Nausea Only, Other (See Comments) and Shortness Of Breath    Reaction:  Stomach pain    Telbivudine Other (See Comments)    Unknown reaction   Uloric [Febuxostat] Anaphylaxis    Locks pt's body up    Aspirin  Rash and Other (See Comments)    Stomach pain (tolerates lower doses)   Celecoxib Other (See Comments)    GI bleed, weakness, and stomach pain.     Cephalexin Diarrhea, Nausea Only and Other (See Comments)    stomach pain    Codeine Diarrhea, Nausea Only and Other (See Comments)    Reaction:  Stomach pain Pt tolerates morphine     Dilaudid  [Hydromorphone  Hcl] Other (See Comments)    Reactions: easy to overdose - blood pressure drops really low   Erythromycin Diarrhea, Nausea Only and Other (See Comments)    Reaction:  Stomach pain    Iodinated Contrast Media Rash and Other (See Comments)    Pt states that she is unable to have because she has chronic kidney disease.     Lipitor [Atorvastatin Calcium ] Nausea Only    Reaction: nausea, weakness, pass blood   Oxycodone  Other (See Comments)    Reaction:  Stomach pain    Pseudoephedrine    Aleve [Naproxen]     Reaction: severe stomach pain   Atorvastatin Nausea Only and Other (See Comments)    Weakness    Cephalosporins Diarrhea, Nausea Only and Other (  See Comments)    Other: abdominal pain  Tolerated 1st generation cephalosporin (CEFAZOLIN ) on 06/02/2022 and 01/21/2023 without documented ADRs.    Doxycycline     Stomach pain    Iodine Rash   Motrin [Ibuprofen]     Reaction: severe stomach pain   Tape Other (See Comments)    Causes pts skin to tear  Pt states that she is  able to use paper tape.       Valdecoxib Swelling and Other (See Comments)    Pt states that her hands and feet swell.      Consultations: GI   Procedures/Studies: DG ESOPHAGUS W SINGLE CM (SOL OR THIN BA) Result Date: 05/27/2024 CLINICAL DATA:  83 year old female currently admitted due to intractable nausea and vomiting. Patient reports tablets and solid foods getting stuck in her mid chest for the past 6 months. She reports intermittent episodes of regurgitation prior to this admission. Denies any difficulties with liquids. EXAM: ESOPHAGUS/BARIUM SWALLOW/TABLET STUDY TECHNIQUE: Single contrast examination was performed using thin liquid barium. This exam was performed by [facility] PA-C, and was supervised and interpreted by Dr. Harrietta Sherry. FLUOROSCOPY: Radiation Exposure Index (as provided by the fluoroscopic device): 37.8 mGy Kerma COMPARISON:  CT A/P WO CONTRAST - 05/14/24. FINDINGS: Swallowing: Appears normal. No vestibular penetration or aspiration seen. Pharynx: Unremarkable. Esophagus: Normal appearance. No obvious mucosal abnormality or visible ulceration. Esophageal motility: Mild esophageal dysmotility leading to delayed passage of contrast from the esophagus into the stomach. Intermittent tertiary contractions and proximal escape. Hiatal Hernia: Possible small hiatal hernia, although poorly visualized on exam due to patient movement. Gastroesophageal reflux: None visualized. Ingested 13mm barium tablet: Tablet initially stuck in the distal esophagus, but subsequently passed with additional water  intake. Other: Limited exam due to patient mobility. Entirety of exam performed at approximately 25 degrees table tilt. IMPRESSION: 1. Mild esophageal dysmotility. 2. Possible small hiatal hernia, poorly visualized during this exam. Electronically Signed   By: Harrietta Sherry M.D.   On: 05/27/2024 12:20   US  ABDOMEN LIMITED RUQ (LIVER/GB) Result Date: 05/25/2024 CLINICAL DATA:  Nausea  and von vomiting, right upper quadrant pain, transaminitis EXAM: ULTRASOUND ABDOMEN LIMITED RIGHT UPPER QUADRANT COMPARISON:  05/25/2024 FINDINGS: Gallbladder: Surgically absent. Common bile duct: Diameter: 10 mm Liver: No focal lesion identified. Within normal limits in parenchymal echogenicity. Portal vein is patent on color Doppler imaging with normal direction of blood flow towards the liver. Other: None. IMPRESSION: 1. Unremarkable right upper quadrant ultrasound in a patient status post cholecystectomy. Electronically Signed   By: Ozell Daring M.D.   On: 05/25/2024 21:31   CT ABDOMEN PELVIS WO CONTRAST Result Date: 05/25/2024 EXAM: CT ABDOMEN AND PELVIS WITHOUT CONTRAST 05/25/2024 06:55:38 PM TECHNIQUE: CT of the abdomen and pelvis was performed without the administration of intravenous contrast. Multiplanar reformatted images are provided for review. Automated exposure control, iterative reconstruction, and/or weight-based adjustment of the mA/kV was utilized to reduce the radiation dose to as low as reasonably achievable. COMPARISON: None available. CLINICAL HISTORY: Eval appy. RLQ TTP. N/V, recently diagnosed hiatal hernia. RLQ pain. FINDINGS: LOWER CHEST: Patient leads noted within the right heart. Cardiac size within normal limits. Visualized lung bases are clear. LIVER: Status post cholecystectomy. GALLBLADDER AND BILE DUCTS: Status post cholecystectomy. SPLEEN: No acute abnormality. PANCREAS: No acute abnormality. ADRENAL GLANDS: No acute abnormality. KIDNEYS, URETERS AND BLADDER: No stones in the kidneys or ureters. No hydronephrosis. No perinephric or periureteral stranding. Urinary bladder is unremarkable. GI AND BOWEL: Status post distal colectomy with  colorectal anastomotic staple line noted. Appendix is not clearly identified, however, there are no secondary signs of appendicitis within the right lower quadrant. Moderate colon stool burden. Stomach, small bowel, and large bowel are otherwise  unremarkable. No free intraperitoneal gas or fluid. PERITONEUM AND RETROPERITONEUM: No free intraperitoneal gas or fluid. VASCULATURE: Moderate aortoiliac atherosclerotic calcification. LYMPH NODES: Surgical clips within the pelvis may relate to prior pelvic lymph node dissection. REPRODUCTIVE ORGANS: Status post hysterectomy. No adnexal masses are seen. BONES AND SOFT TISSUES: Status post right hip ORIF. Status post L4-S1 lumbar fusion with instrumentation. Remote severe anterior wedge compression deformities of T12 and L1 are identified, status post vertebroplasty with residual accentuated thoracolumbar kyphosis. Mild retropulsion of the posterosuperior aspect of the T12 vertebral body results in moderate central canal stenosis. No acute bone abnormality. No lytic or blastic bone lesion. IMPRESSION: 1. No CT evidence of appendicitis. Appendix not clearly identified. 2. Moderate colon stool burden. Electronically signed by: Dorethia Molt MD 05/25/2024 07:23 PM EDT RP Workstation: [id number]   DG Chest Portable 1 View Result Date: 05/25/2024 CLINICAL DATA:  SOB, hypoxic. hx PPM, ILD, dCHF EXAM: PORTABLE CHEST 1 VIEW COMPARISON:  Chest x-ray 10/28/2023, CT chest 05/14/2024 FINDINGS: Left chest wall dual lead pacemaker. \ The heart and mediastinal contours are unchanged. Atherosclerotic plaque. Prominent hilar vasculature. No focal consolidation. No pulmonary edema. No pleural effusion. No pneumothorax. No acute osseous abnormality. Partially visualized intramedullary nail fixation of the left humerus. IMPRESSION: 1. Pulmonary venous congestion.  No overt edema. 2.  Aortic Atherosclerosis (ICD10-I70.0). Electronically Signed   By: Morgane  Naveau M.D.   On: 05/25/2024 17:53   CT CHEST ABDOMEN PELVIS WO CONTRAST Result Date: 05/14/2024 CLINICAL DATA:  Sepsis, upper abdominal pain EXAM: CT CHEST, ABDOMEN AND PELVIS WITHOUT CONTRAST TECHNIQUE: Multidetector CT imaging of the chest, abdomen and pelvis was performed  following the standard protocol without IV contrast. RADIATION DOSE REDUCTION: This exam was performed according to the departmental dose-optimization program which includes automated exposure control, adjustment of the mA and/or kV according to patient size and/or use of iterative reconstruction technique. COMPARISON:  January 08, 2024, February 26, 2021 FINDINGS: Of note, the lack of intravenous contrast limits evaluation of the solid organ parenchyma and vascularity. CT CHEST FINDINGS Cardiovascular: No cardiomegaly or pericardial effusion.No aortic aneurysm. Multi-vessel coronary atherosclerosis. Diffuse aortic atherosclerosis. Left chest pacemaker with leads terminating in the right atrium and right ventricle. Mediastinum/Nodes: No mediastinal mass.No mediastinal, hilar, or axillary lymphadenopathy. Lungs/Pleura: The midline trachea and bronchi are patent. No focal airspace consolidation, pleural effusion, or pneumothorax. Posterior bibasilar dependent atelectasis. Fibrolinear scarring in the medial lung bases. CT ABDOMEN PELVIS FINDINGS Hepatobiliary: No mass.Cholecystectomy. No intrahepatic or extrahepatic biliary ductal dilation. Pancreas: Diffuse fatty atrophy of the pancreatic parenchyma. No mass or ductal dilation. No peripancreatic inflammation or fluid collection. Spleen: Normal size. No mass. Adrenals/Urinary Tract: No adrenal masses. No renal mass. No hydronephrosis or nephrolithiasis. The urinary bladder is distended without focal abnormality. Stomach/Bowel:Small amount of layering fluid in the mid and upper esophagus. Small sliding-type hiatal hernia which is also fluid-filled. The stomach is decompressed without focal abnormality. No small bowel wall thickening or inflammation. No small bowel obstruction.Nonvisualized appendix, possibly surgically absent. Sigmoid anastomosis. Vascular/Lymphatic: No aortic aneurysm. Diffuse aortoiliac atherosclerosis. No intraabdominal or pelvic lymphadenopathy.  Reproductive: Hysterectomy. No concerning adnexal mass. No free pelvic fluid. Other: No pneumoperitoneum, ascites, or mesenteric inflammation. Musculoskeletal: No acute fracture or destructive lesion. Diffuse osteopenia. Chronic compression fractures of T12 and L1 status post vertebroplasty. Lumbosacral  fusion hardware from L4 through S1. Partially visualized cervical fusion hardware. Multilevel thoracic osteophytosis. IMPRESSION: 1. No acute abnormality within the chest, abdomen, and pelvis. 2. Small amount of layering fluid in the upper and midesophagus with a small sliding-type hiatal hernia, which is also fluid-filled. Aortic Atherosclerosis (ICD10-I70.0). Electronically Signed   By: Rogelia Myers M.D.   On: 05/14/2024 17:19      Subjective: Seen and examined on day of discharge Stable, appropriate for return to [facility]  Discharge Exam: Vitals:   05/31/24 0519 05/31/24 0729  BP: (!) 130/51 (!) 114/47  Pulse: 61 61  Resp: 16 18  Temp: 98 F (36.7 C) 98 F (36.7 C)  SpO2: 97% 98%   Vitals:   05/30/24 1624 05/30/24 2001 05/31/24 0519 05/31/24 0729  BP: (!) 119/49 (!) 112/53 (!) 130/51 (!) 114/47  Pulse: 77 64 61 61  Resp: 19 16 16 18   Temp: 98 F (36.7 C) 98.2 F (36.8 C) 98 F (36.7 C) 98 F (36.7 C)  TempSrc:   Oral   SpO2: 100% 100% 97% 98%  Weight:      Height:        General: Pt is alert, awake, not in acute distress Cardiovascular: RRR, S1/S2 +, no rubs, no gallops Respiratory: CTA bilaterally, no wheezing, no rhonchi Abdominal: Soft, NT, ND, bowel sounds + Extremities: no edema, no cyanosis    The results of significant diagnostics from this hospitalization (including imaging, microbiology, ancillary and laboratory) are listed below for reference.     Microbiology: Recent Results (from the past 240 hours)  Resp panel by RT-PCR (RSV, Flu A&B, Covid) Anterior Nasal Swab     Status: None   Collection Time: 05/25/24  5:35 PM   Specimen: Anterior Nasal Swab   Result Value Ref Range Status   SARS Coronavirus 2 by RT PCR NEGATIVE NEGATIVE Final    Comment: (NOTE) SARS-CoV-2 target nucleic acids are NOT DETECTED.  The SARS-CoV-2 RNA is generally detectable in upper respiratory specimens during the acute phase of infection. The lowest concentration of SARS-CoV-2 viral copies this assay can detect is 138 copies/mL. A negative result does not preclude SARS-Cov-2 infection and should not be used as the sole basis for treatment or other patient management decisions. A negative result may occur with  improper specimen collection/handling, submission of specimen other than nasopharyngeal swab, presence of viral mutation(s) within the areas targeted by this assay, and inadequate number of viral copies(<138 copies/mL). A negative result must be combined with clinical observations, patient history, and epidemiological information. The expected result is Negative.  Fact Sheet for Patients:  BloggerCourse.com  Fact Sheet for Healthcare Providers:  SeriousBroker.it  This test is no t yet approved or cleared by the [place]  FDA and  has been authorized for detection and/or diagnosis of SARS-CoV-2 by FDA under an Emergency Use Authorization (EUA). This EUA will remain  in effect (meaning this test can be used) for the duration of the COVID-19 declaration under Section 564(b)(1) of the Act, 21 U.S.C.section 360bbb-3(b)(1), unless the authorization is terminated  or revoked sooner.       Influenza A by PCR NEGATIVE NEGATIVE Final   Influenza B by PCR NEGATIVE NEGATIVE Final    Comment: (NOTE) The Xpert Xpress SARS-CoV-2/FLU/RSV plus assay is intended as an aid in the diagnosis of influenza from Nasopharyngeal swab specimens and should not be used as a sole basis for treatment. Nasal washings and aspirates are unacceptable for Xpert Xpress SARS-CoV-2/FLU/RSV testing.  Fact Sheet  for  Patients: BloggerCourse.com  Fact Sheet for Healthcare Providers: SeriousBroker.it  This test is not yet approved or cleared by the [place]  FDA and has been authorized for detection and/or diagnosis of SARS-CoV-2 by FDA under an Emergency Use Authorization (EUA). This EUA will remain in effect (meaning this test can be used) for the duration of the COVID-19 declaration under Section 564(b)(1) of the Act, 21 U.S.C. section 360bbb-3(b)(1), unless the authorization is terminated or revoked.     Resp Syncytial Virus by PCR NEGATIVE NEGATIVE Final    Comment: (NOTE) Fact Sheet for Patients: BloggerCourse.com  Fact Sheet for Healthcare Providers: SeriousBroker.it  This test is not yet approved or cleared by the [place]  FDA and has been authorized for detection and/or diagnosis of SARS-CoV-2 by FDA under an Emergency Use Authorization (EUA). This EUA will remain in effect (meaning this test can be used) for the duration of the COVID-19 declaration under Section 564(b)(1) of the Act, 21 U.S.C. section 360bbb-3(b)(1), unless the authorization is terminated or revoked.  Performed at [facility], [address] [place], [place] [address]      Labs: BNP (last 3 results) Recent Labs    10/28/23 1123 05/25/24 1734  BNP 417.4* 262.5*   Basic Metabolic Panel: Recent Labs  Lab 05/25/24 1734 05/26/24 0419 05/27/24 0319 05/28/24 0352 05/29/24 0430 05/30/24 0528 05/31/24 0320  NA 137   < > 142 139 137 135 136  K 4.8   < > 4.0 4.2 5.0 4.2 4.6  CL 103   < > 109 103 100 95* 99  CO2 21*   < > 25 28 29 27 28   GLUCOSE 337*   < > 57* 150* 199* 224* 159*  BUN 57*   < > 36* 27* 27* 34* 46*  CREATININE 1.63*   < > 1.22* 1.21* 1.28* 1.47* 1.75*  CALCIUM  8.8*   < > 8.5* 8.7* 8.8* 9.0 9.0  MG 1.9  --   --   --   --   --   --    < > = values in this interval not  displayed.   Liver Function Tests: Recent Labs  Lab 05/27/24 0319 05/28/24 0352 05/29/24 0430 05/30/24 0528 05/31/24 0320  AST 80* 71* 61* 55* 50*  ALT 69* 62* 56* 53* 42  ALKPHOS 129* 133* 131* 125 123  BILITOT 0.8 0.9 1.0 1.4* 1.3*  PROT 6.7 6.6 6.5 6.7 6.7  ALBUMIN  3.5 3.4* 3.4* 3.7 3.4*   Recent Labs  Lab 05/25/24 1734  LIPASE 30   No results for input(s): AMMONIA in the last 168 hours. CBC: Recent Labs  Lab 05/25/24 1734 05/26/24 0419 05/27/24 0319 05/28/24 0352 05/29/24 0430 05/30/24 0528 05/31/24 0320  WBC 7.2   < > 3.4* 4.3 4.3 4.8 5.6  NEUTROABS 6.2  --   --   --   --   --   --   HGB 14.1   < > 11.7* 12.0 12.0 12.2 11.9*  HCT 44.0   < > 35.9* 36.5 36.9 36.3 36.2  MCV 101.6*   < > 100.6* 100.0 98.7 97.3 98.6  PLT 98*   < > 129* 141* 153 161 156   < > = values in this interval not displayed.   Cardiac Enzymes: No results for input(s): CKTOTAL, CKMB, CKMBINDEX, TROPONINI in the last 168 hours. BNP: Invalid input(s): POCBNP CBG: Recent Labs  Lab 05/30/24 1959 05/30/24 2108 05/30/24 2209 05/31/24 0013 05/31/24 0729  GLUCAP 69* 132* 134*  152* 219*   D-Dimer No results for input(s): DDIMER in the last 72 hours. Hgb A1c No results for input(s): HGBA1C in the last 72 hours. Lipid Profile No results for input(s): CHOL, HDL, LDLCALC, TRIG, CHOLHDL, LDLDIRECT in the last 72 hours. Thyroid  function studies No results for input(s): TSH, T4TOTAL, T3FREE, THYROIDAB in the last 72 hours.  Invalid input(s): FREET3 Anemia work up No results for input(s): VITAMINB12, FOLATE, FERRITIN, TIBC, IRON , RETICCTPCT in the last 72 hours. Urinalysis    Component Value Date/Time   COLORURINE YELLOW (A) 05/14/2024 1056   APPEARANCEUR CLEAR (A) 05/14/2024 1056   APPEARANCEUR Hazy 10/29/2012 1534   LABSPEC 1.009 05/14/2024 1056   LABSPEC 1.010 10/29/2012 1534   PHURINE 5.0 05/14/2024 1056   GLUCOSEU NEGATIVE  05/14/2024 1056   GLUCOSEU Negative 10/29/2012 1534   HGBUR NEGATIVE 05/14/2024 1056   BILIRUBINUR NEGATIVE 05/14/2024 1056   BILIRUBINUR Negative 10/29/2012 1534   KETONESUR NEGATIVE 05/14/2024 1056   PROTEINUR NEGATIVE 05/14/2024 1056   NITRITE NEGATIVE 05/14/2024 1056   LEUKOCYTESUR NEGATIVE 05/14/2024 1056   LEUKOCYTESUR Negative 10/29/2012 1534   Sepsis Labs Recent Labs  Lab 05/28/24 0352 05/29/24 0430 05/30/24 0528 05/31/24 0320  WBC 4.3 4.3 4.8 5.6   Microbiology Recent Results (from the past 240 hours)  Resp panel by RT-PCR (RSV, Flu A&B, Covid) Anterior Nasal Swab     Status: None   Collection Time: 05/25/24  5:35 PM   Specimen: Anterior Nasal Swab  Result Value Ref Range Status   SARS Coronavirus 2 by RT PCR NEGATIVE NEGATIVE Final    Comment: (NOTE) SARS-CoV-2 target nucleic acids are NOT DETECTED.  The SARS-CoV-2 RNA is generally detectable in upper respiratory specimens during the acute phase of infection. The lowest concentration of SARS-CoV-2 viral copies this assay can detect is 138 copies/mL. A negative result does not preclude SARS-Cov-2 infection and should not be used as the sole basis for treatment or other patient management decisions. A negative result may occur with  improper specimen collection/handling, submission of specimen other than nasopharyngeal swab, presence of viral mutation(s) within the areas targeted by this assay, and inadequate number of viral copies(<138 copies/mL). A negative result must be combined with clinical observations, patient history, and epidemiological information. The expected result is Negative.  Fact Sheet for Patients:  BloggerCourse.com  Fact Sheet for Healthcare Providers:  SeriousBroker.it  This test is no t yet approved or cleared by the [place]  FDA and  has been authorized for detection and/or diagnosis of SARS-CoV-2 by FDA under an Emergency Use  Authorization (EUA). This EUA will remain  in effect (meaning this test can be used) for the duration of the COVID-19 declaration under Section 564(b)(1) of the Act, 21 U.S.C.section 360bbb-3(b)(1), unless the authorization is terminated  or revoked sooner.       Influenza A by PCR NEGATIVE NEGATIVE Final   Influenza B by PCR NEGATIVE NEGATIVE Final    Comment: (NOTE) The Xpert Xpress SARS-CoV-2/FLU/RSV plus assay is intended as an aid in the diagnosis of influenza from Nasopharyngeal swab specimens and should not be used as a sole basis for treatment. Nasal washings and aspirates are unacceptable for Xpert Xpress SARS-CoV-2/FLU/RSV testing.  Fact Sheet for Patients: BloggerCourse.com  Fact Sheet for Healthcare Providers: SeriousBroker.it  This test is not yet approved or cleared by the [place]  FDA and has been authorized for detection and/or diagnosis of SARS-CoV-2 by FDA under an Emergency Use Authorization (EUA). This EUA will remain in effect (meaning this  test can be used) for the duration of the COVID-19 declaration under Section 564(b)(1) of the Act, 21 U.S.C. section 360bbb-3(b)(1), unless the authorization is terminated or revoked.     Resp Syncytial Virus by PCR NEGATIVE NEGATIVE Final    Comment: (NOTE) Fact Sheet for Patients: BloggerCourse.com  Fact Sheet for Healthcare Providers: SeriousBroker.it  This test is not yet approved or cleared by the [place]  FDA and has been authorized for detection and/or diagnosis of SARS-CoV-2 by FDA under an Emergency Use Authorization (EUA). This EUA will remain in effect (meaning this test can be used) for the duration of the COVID-19 declaration under Section 564(b)(1) of the Act, 21 U.S.C. section 360bbb-3(b)(1), unless the authorization is terminated or revoked.  Performed at [facility], [address], [place], [place] [address]      Time coordinating discharge: 40 minutes   SIGNED:   Calvin KATHEE Robson, MD  [facility] 05/31/2024, 9:30 AM Pager   If 7PM-7AM, please contact night-coverage

## 2024-05-31 NOTE — Progress Notes (Signed)
 Attempted to call [facility] 3 times for report. Phone was not answered. Discharge instructions were put into folder to give to facility.

## 2024-05-31 NOTE — TOC Transition Note (Signed)
 Transition of Care [facility]) - Discharge Note   Patient Details  Name: Deborah Obrien MRN: [id number] Date of Birth: 09/09/1941  [facility] CM/SW Contact:  Alfonso Rummer, LCSW Phone Number: 05/31/2024, 11:09 AM   Clinical Narrative:     KEN DELENA Rummer contacted [facility] via phone spk with care coordinator Catina. LCSW A Rummer confirmed Ms Uresti will return to [facility] upon discharge. [place] [facility] and discharge summary was emailed to [email or web address]. Care Coordinator Catina reports [facility] will pick up pt at 3 pm.         Patient Goals and CMS Choice            Discharge Placement                 [facility]       Discharge Plan and Services Additional resources added to the After Visit Summary for                                       Social Drivers of Health (SDOH) Interventions SDOH Screenings   Food Insecurity: No Food Insecurity (05/25/2024)  Housing: Low Risk  (05/25/2024)  Transportation Needs: No Transportation Needs (05/25/2024)  Utilities: Not At Risk (05/25/2024)  Financial Resource Strain: Low Risk  (05/23/2024)   Received from [facility]  Physical Activity: Unknown (02/19/2019)   Received from [facility]  Social Connections: Socially Integrated (05/25/2024)  Stress: Stress Concern Present (02/19/2019)   Received from [facility]  Tobacco Use: Low Risk  (05/29/2024)     Readmission Risk Interventions    05/27/2024   10:43 AM 01/10/2024    9:49 AM 10/30/2023    4:25 PM  Readmission Risk Prevention Plan  Transportation Screening Complete  Complete  Medication Review (RN Care Manager)   Complete  PCP or Specialist appointment within 3-5 days of discharge Complete Complete Complete  HRI or Home Care Consult Complete Complete Complete  SW Recovery Care/Counseling Consult Complete Complete   Palliative Care Screening Not Applicable Not  Applicable Complete  Skilled Nursing Facility Not Applicable Not Applicable Complete

## 2024-06-03 ENCOUNTER — Ambulatory Visit: Payer: Self-pay | Admitting: Gastroenterology

## 2024-06-04 NOTE — Progress Notes (Signed)
 Given age and nausea covid test ordered

## 2024-06-10 ENCOUNTER — Ambulatory Visit (INDEPENDENT_AMBULATORY_CARE_PROVIDER_SITE_OTHER): Payer: Medicare Other

## 2024-06-10 DIAGNOSIS — I480 Paroxysmal atrial fibrillation: Secondary | ICD-10-CM

## 2024-06-10 LAB — CUP PACEART REMOTE DEVICE CHECK
Battery Remaining Longevity: 61 mo
Battery Voltage: 2.9 V
Brady Statistic AP VP Percent: 0.04 %
Brady Statistic AP VS Percent: 99.62 %
Brady Statistic AS VP Percent: 0 %
Brady Statistic AS VS Percent: 0.34 %
Brady Statistic RA Percent Paced: 99.67 %
Brady Statistic RV Percent Paced: 0.04 %
Date Time Interrogation Session: 20250929042822
Implantable Lead Connection Status: 753985
Implantable Lead Connection Status: 753985
Implantable Lead Implant Date: 20161019
Implantable Lead Implant Date: 20181217
Implantable Lead Location: 753859
Implantable Lead Location: 753860
Implantable Lead Model: 5076
Implantable Lead Model: 5076
Implantable Pulse Generator Implant Date: 20181217
Lead Channel Impedance Value: 304 Ohm
Lead Channel Impedance Value: 342 Ohm
Lead Channel Impedance Value: 380 Ohm
Lead Channel Impedance Value: 418 Ohm
Lead Channel Pacing Threshold Amplitude: 0.75 V
Lead Channel Pacing Threshold Amplitude: 0.875 V
Lead Channel Pacing Threshold Pulse Width: 0.4 ms
Lead Channel Pacing Threshold Pulse Width: 0.4 ms
Lead Channel Sensing Intrinsic Amplitude: 4.875 mV
Lead Channel Sensing Intrinsic Amplitude: 4.875 mV
Lead Channel Sensing Intrinsic Amplitude: 5.375 mV
Lead Channel Sensing Intrinsic Amplitude: 5.375 mV
Lead Channel Setting Pacing Amplitude: 2 V
Lead Channel Setting Pacing Amplitude: 2.5 V
Lead Channel Setting Pacing Pulse Width: 0.4 ms
Lead Channel Setting Sensing Sensitivity: 1.2 mV
Zone Setting Status: 755011

## 2024-06-12 NOTE — Progress Notes (Signed)
 Remote PPM Transmission

## 2024-06-16 ENCOUNTER — Ambulatory Visit: Payer: Self-pay | Admitting: Cardiology

## 2024-06-20 ENCOUNTER — Encounter: Payer: Self-pay | Admitting: Cardiology

## 2024-06-20 ENCOUNTER — Ambulatory Visit: Attending: Cardiology | Admitting: Cardiology

## 2024-06-20 VITALS — BP 128/76 | HR 72 | Wt 153.0 lb

## 2024-06-20 DIAGNOSIS — Z79899 Other long term (current) drug therapy: Secondary | ICD-10-CM

## 2024-06-20 DIAGNOSIS — Z5181 Encounter for therapeutic drug level monitoring: Secondary | ICD-10-CM

## 2024-06-20 DIAGNOSIS — Z950 Presence of cardiac pacemaker: Secondary | ICD-10-CM

## 2024-06-20 DIAGNOSIS — I480 Paroxysmal atrial fibrillation: Secondary | ICD-10-CM

## 2024-06-20 DIAGNOSIS — D6869 Other thrombophilia: Secondary | ICD-10-CM | POA: Diagnosis not present

## 2024-06-20 DIAGNOSIS — I495 Sick sinus syndrome: Secondary | ICD-10-CM | POA: Diagnosis not present

## 2024-06-20 LAB — CUP PACEART INCLINIC DEVICE CHECK
Date Time Interrogation Session: 20251009161308
Implantable Lead Connection Status: 753985
Implantable Lead Connection Status: 753985
Implantable Lead Implant Date: 20161019
Implantable Lead Implant Date: 20181217
Implantable Lead Location: 753859
Implantable Lead Location: 753860
Implantable Lead Model: 5076
Implantable Lead Model: 5076
Implantable Pulse Generator Implant Date: 20181217

## 2024-06-20 NOTE — Patient Instructions (Signed)
 Medication Instructions:  Your physician recommends that you continue on your current medications as directed. Please refer to the Current Medication list given to you today.   *If you need a refill on your cardiac medications before your next appointment, please call your pharmacy*  Lab Work: Your provider would like for you to have following labs drawn today LFT, TSH, T4, CBC.   If you have labs (blood work) drawn today and your tests are completely normal, you will receive your results only by: MyChart Message (if you have MyChart) OR A paper copy in the mail If you have any lab test that is abnormal or we need to change your treatment, we will call you to review the results.  Testing/Procedures: No test ordered today   Follow-Up: At [facility], you and your health needs are our priority.  As part of our continuing mission to provide you with exceptional heart care, our providers are all part of one team.  This team includes your primary Cardiologist (physician) and Advanced Practice Providers or APPs (Physician Assistants and Nurse Practitioners) who all work together to provide you with the care you need, when you need it.  Your next appointment:   6 month(s)  Provider:   Suzann Riddle, NP or Fonda Kitty, MD   We recommend signing up for the patient portal called MyChart.  Sign up information is provided on this After Visit Summary.  MyChart is used to connect with patients for Virtual Visits (Telemedicine).  Patients are able to view lab/test results, encounter notes, upcoming appointments, etc.  Non-urgent messages can be sent to your provider as well.   To learn more about what you can do with MyChart, go to [email or web address].

## 2024-06-20 NOTE — Progress Notes (Signed)
 Electrophysiology Clinic Note    Date:  06/20/2024  Patient ID:  Deborah Obrien, Deborah Obrien [birth year], MRN [id number] PCP:  Cordella Corning, FNP  Cardiologist:  None  Electrophysiologist:  Fonda Kitty, MD  Electrophysiology APP:  Afshin Chrystal, NP     Discussed the use of AI scribe software for clinical note transcription with the patient, who gave verbal consent to proceed.   Patient Profile    Chief Complaint: Afib, PPM follow-up  History of Present Illness: Deborah Obrien is a 83 y.o. female with PMH notable for parox Afib/flutter, SND s/p PPM, HFpEF, CAD, CKD-4, T2DM, HTN, hypothyroid; seen today for Fonda Kitty, MD (previously Dr. Fernande) for routine electrophysiology followup.   I last saw her 06/2023, she had numerous falls in the year prior requiring surgeries and relocating to assisted living.  She has had multiple hospitalizations since that time including most recently 05/2024 for N/V/abd pain, found to have hematin in esophagus. Eliquis  resumed.  On follow-up today, she is doing ok from a cardiac perspective. Has not had any AFib episodes that she is aware of. She continues to take amiodarone  100mg  daily. She does continue to have abd pain with eating.  No further dark stools.  She denies chest pain, chest pressure, palpitations. No further N/V. No dizziness, syncope, or presyncope.     Arrhythmia/Device History MDT dual chamber PPM Was originally Single chamber > added RA lead 08/2017 resulting in tension > RA lead revision    AAD History: Amiodarone       ROS:  Please see the history of present illness. All other systems are reviewed and otherwise negative.    Physical Exam    VS:  BP 128/76 (BP Location: Left Arm, Patient Position: Sitting, Cuff Size: Normal)   Pulse 72   Wt 153 lb (69.4 kg)   SpO2 98%   BMI 27.10 kg/m  BMI: Body mass index is 27.1 kg/m.  Orthostatic VS for the past 24 hrs (Last 3 readings):  BP- Lying Pulse- Lying BP-  Sitting Pulse- Sitting BP- Standing at 0 minutes Pulse- Standing at 0 minutes BP- Standing at 3 minutes Pulse- Standing at 3 minutes  06/20/24 1342 134/74 82 112/61 78 109/59 70 124/68 62          Wt Readings from Last 3 Encounters:  06/20/24 153 lb (69.4 kg)  05/25/24 154 lb 15.7 oz (70.3 kg)  01/10/24 153 lb (69.4 kg)     GEN- The patient is well appearing, alert and oriented x 3 today.   Lungs- Clear to ausculation bilaterally, normal work of breathing.  Heart- Regular rate and rhythm, no murmurs, rubs or gallops Extremities- No peripheral edema, warm, dry Skin-  device pocket well-healed, no tethering  Device interrogation done today and reviewed by myself:  Battery 5.1 years Lead thresholds, impedence, sensing stable  No recent episodes No changes made today   Studies Reviewed   Previous EP, cardiology notes.    EKG is not ordered. Personal review of EKG from 05/25/2024 shows: AP with prolonged AV conduction; rate 64        TTE, 01/22/2023  1. Left ventricular ejection fraction, by estimation, is 60 to 65%. Left ventricular ejection fraction by PLAX is 63 %. The left ventricle has normal function. The left ventricle has no regional wall motion abnormalities. Left ventricular diastolic parameters are indeterminate.   2. Right ventricular systolic function is low normal. The right ventricular size is mildly enlarged. There is moderately elevated pulmonary  artery systolic pressure. The estimated right ventricular systolic pressure is 51.2 mmHg.   3. The mitral valve is normal in structure. Mild mitral valve regurgitation. No evidence of mitral stenosis.   4. Tricuspid valve regurgitation is moderate to severe.   5. The aortic valve is tricuspid. Aortic valve regurgitation is not visualized. No aortic stenosis is present.   6. The inferior vena cava is normal in size with greater than 50% respiratory variability, suggesting right atrial pressure of 3 mmHg.    TTE,  04/10/2018 - Left ventricle: The cavity size was normal. Wall thickness was normal. Systolic function was normal. The estimated ejection fraction was in the range of 60% to 65%.  - Mitral valve: Moderately thickened, mildly calcified leaflets .  - Left atrium: The atrium was mildly dilated.  - Right ventricle: The cavity size was mildly to moderately dilated. Wall thickness was mildly increased.  - Right atrium: The atrium was mildly dilated.  - Tricuspid valve: There was moderate regurgitation.  - Pulmonary arteries: Systolic pressure was moderately increased,    estimated to be 50 mm Hg plus central venous/right atrial pressure.    Assessment and Plan     #) parox AFib #) amiodarone  monitoring No recent AFib episodes Recent LFTs stable at hospital discharge, will update to confirm stability Update thyroid  labs today  #) Hypercoag d/t parox afib #) frequent falls #) GI bleed CHA2DS2-VASc Score = at least 9 [CHF History: 1, HTN History: 1, Diabetes History: 1, Stroke History: 2, Vascular Disease History: 1, Age Score: 2, Gender Score: 1].  Therefore, the patient's annual risk of stroke is 12.2 %.    Stroke ppx - 2.5mg  eliquis  BID, appropriately dosed No bleeding concerns   #) SND s/p PPM Device functioning well, see paceart for details Low VP       Current medicines are reviewed at length with the patient today.   The patient does not have concerns regarding her medicines.  The following changes were made today:  none  Labs/ tests ordered today include:  Orders Placed This Encounter  Procedures   Hepatic function panel   T4, free   TSH   CBC     Disposition: Follow up with Dr. Kennyth or EP APP in 6 months, prefer MD to establish care   Signed, Chantal Needle, NP  06/20/24  4:11 PM  Electrophysiology [facility]

## 2024-06-21 ENCOUNTER — Ambulatory Visit: Payer: Self-pay | Admitting: Cardiology

## 2024-06-21 LAB — CBC
Hematocrit: 41.2 % (ref 34.0–46.6)
Hemoglobin: 12.9 g/dL (ref 11.1–15.9)
MCH: 32.4 pg (ref 26.6–33.0)
MCHC: 31.3 g/dL — ABNORMAL LOW (ref 31.5–35.7)
MCV: 104 fL — ABNORMAL HIGH (ref 79–97)
Platelets: 186 x10E3/uL (ref 150–450)
RBC: 3.98 x10E6/uL (ref 3.77–5.28)
RDW: 11.8 % (ref 11.7–15.4)
WBC: 4.7 x10E3/uL (ref 3.4–10.8)

## 2024-06-21 LAB — HEPATIC FUNCTION PANEL
ALT: 30 IU/L (ref 0–32)
AST: 46 IU/L — ABNORMAL HIGH (ref 0–40)
Albumin: 4.4 g/dL (ref 3.7–4.7)
Alkaline Phosphatase: 170 IU/L — ABNORMAL HIGH (ref 48–129)
Bilirubin Total: 0.5 mg/dL (ref 0.0–1.2)
Bilirubin, Direct: 0.21 mg/dL (ref 0.00–0.40)
Total Protein: 7.1 g/dL (ref 6.0–8.5)

## 2024-06-21 LAB — T4, FREE: Free T4: 1.15 ng/dL (ref 0.82–1.77)

## 2024-06-21 LAB — TSH: TSH: 3.54 u[IU]/mL (ref 0.450–4.500)

## 2024-07-22 ENCOUNTER — Inpatient Hospital Stay
Admission: EM | Admit: 2024-07-22 | Discharge: 2024-07-29 | DRG: 690 | Disposition: A | Discharge status: Nursing Home (Includes ICF) | Source: Skilled Nursing Facility | Attending: Internal Medicine | Admitting: Internal Medicine

## 2024-07-22 ENCOUNTER — Emergency Department

## 2024-07-22 ENCOUNTER — Other Ambulatory Visit: Payer: Self-pay

## 2024-07-22 DIAGNOSIS — D696 Thrombocytopenia, unspecified: Secondary | ICD-10-CM | POA: Diagnosis present

## 2024-07-22 DIAGNOSIS — Z9842 Cataract extraction status, left eye: Secondary | ICD-10-CM

## 2024-07-22 DIAGNOSIS — Z794 Long term (current) use of insulin: Secondary | ICD-10-CM

## 2024-07-22 DIAGNOSIS — Z86018 Personal history of other benign neoplasm: Secondary | ICD-10-CM

## 2024-07-22 DIAGNOSIS — N179 Acute kidney failure, unspecified: Secondary | ICD-10-CM

## 2024-07-22 DIAGNOSIS — N390 Urinary tract infection, site not specified: Secondary | ICD-10-CM | POA: Diagnosis not present

## 2024-07-22 DIAGNOSIS — I9589 Other hypotension: Secondary | ICD-10-CM | POA: Diagnosis present

## 2024-07-22 DIAGNOSIS — Z22322 Carrier or suspected carrier of Methicillin resistant Staphylococcus aureus: Secondary | ICD-10-CM

## 2024-07-22 DIAGNOSIS — Z1152 Encounter for screening for COVID-19: Secondary | ICD-10-CM

## 2024-07-22 DIAGNOSIS — R638 Other symptoms and signs concerning food and fluid intake: Secondary | ICD-10-CM | POA: Insufficient documentation

## 2024-07-22 DIAGNOSIS — Z8249 Family history of ischemic heart disease and other diseases of the circulatory system: Secondary | ICD-10-CM

## 2024-07-22 DIAGNOSIS — Z79899 Other long term (current) drug therapy: Secondary | ICD-10-CM

## 2024-07-22 DIAGNOSIS — I130 Hypertensive heart and chronic kidney disease with heart failure and stage 1 through stage 4 chronic kidney disease, or unspecified chronic kidney disease: Secondary | ICD-10-CM | POA: Diagnosis present

## 2024-07-22 DIAGNOSIS — R300 Dysuria: Secondary | ICD-10-CM | POA: Diagnosis not present

## 2024-07-22 DIAGNOSIS — Z7901 Long term (current) use of anticoagulants: Secondary | ICD-10-CM

## 2024-07-22 DIAGNOSIS — E119 Type 2 diabetes mellitus without complications: Secondary | ICD-10-CM

## 2024-07-22 DIAGNOSIS — F411 Generalized anxiety disorder: Secondary | ICD-10-CM | POA: Diagnosis present

## 2024-07-22 DIAGNOSIS — Z888 Allergy status to other drugs, medicaments and biological substances status: Secondary | ICD-10-CM

## 2024-07-22 DIAGNOSIS — Z9109 Other allergy status, other than to drugs and biological substances: Secondary | ICD-10-CM

## 2024-07-22 DIAGNOSIS — Z90710 Acquired absence of both cervix and uterus: Secondary | ICD-10-CM

## 2024-07-22 DIAGNOSIS — I5032 Chronic diastolic (congestive) heart failure: Secondary | ICD-10-CM | POA: Diagnosis present

## 2024-07-22 DIAGNOSIS — Z66 Do not resuscitate: Secondary | ICD-10-CM | POA: Diagnosis present

## 2024-07-22 DIAGNOSIS — I5030 Unspecified diastolic (congestive) heart failure: Secondary | ICD-10-CM | POA: Diagnosis present

## 2024-07-22 DIAGNOSIS — M810 Age-related osteoporosis without current pathological fracture: Secondary | ICD-10-CM | POA: Diagnosis present

## 2024-07-22 DIAGNOSIS — E1149 Type 2 diabetes mellitus with other diabetic neurological complication: Secondary | ICD-10-CM | POA: Diagnosis present

## 2024-07-22 DIAGNOSIS — I495 Sick sinus syndrome: Secondary | ICD-10-CM | POA: Diagnosis present

## 2024-07-22 DIAGNOSIS — Z882 Allergy status to sulfonamides status: Secondary | ICD-10-CM

## 2024-07-22 DIAGNOSIS — N3946 Mixed incontinence: Secondary | ICD-10-CM | POA: Diagnosis present

## 2024-07-22 DIAGNOSIS — F01B3 Vascular dementia, moderate, with mood disturbance: Secondary | ICD-10-CM | POA: Diagnosis present

## 2024-07-22 DIAGNOSIS — E1122 Type 2 diabetes mellitus with diabetic chronic kidney disease: Secondary | ICD-10-CM | POA: Diagnosis present

## 2024-07-22 DIAGNOSIS — K760 Fatty (change of) liver, not elsewhere classified: Secondary | ICD-10-CM | POA: Diagnosis present

## 2024-07-22 DIAGNOSIS — F329 Major depressive disorder, single episode, unspecified: Secondary | ICD-10-CM | POA: Diagnosis present

## 2024-07-22 DIAGNOSIS — Z9049 Acquired absence of other specified parts of digestive tract: Secondary | ICD-10-CM

## 2024-07-22 DIAGNOSIS — Z9841 Cataract extraction status, right eye: Secondary | ICD-10-CM

## 2024-07-22 DIAGNOSIS — Z881 Allergy status to other antibiotic agents status: Secondary | ICD-10-CM

## 2024-07-22 DIAGNOSIS — I480 Paroxysmal atrial fibrillation: Secondary | ICD-10-CM | POA: Diagnosis present

## 2024-07-22 DIAGNOSIS — N3000 Acute cystitis without hematuria: Secondary | ICD-10-CM | POA: Diagnosis not present

## 2024-07-22 DIAGNOSIS — M549 Dorsalgia, unspecified: Secondary | ICD-10-CM | POA: Diagnosis present

## 2024-07-22 DIAGNOSIS — Z885 Allergy status to narcotic agent status: Secondary | ICD-10-CM

## 2024-07-22 DIAGNOSIS — E11649 Type 2 diabetes mellitus with hypoglycemia without coma: Secondary | ICD-10-CM | POA: Diagnosis not present

## 2024-07-22 DIAGNOSIS — E1165 Type 2 diabetes mellitus with hyperglycemia: Secondary | ICD-10-CM | POA: Diagnosis present

## 2024-07-22 DIAGNOSIS — E875 Hyperkalemia: Secondary | ICD-10-CM | POA: Diagnosis present

## 2024-07-22 DIAGNOSIS — B9562 Methicillin resistant Staphylococcus aureus infection as the cause of diseases classified elsewhere: Secondary | ICD-10-CM | POA: Diagnosis present

## 2024-07-22 DIAGNOSIS — G2581 Restless legs syndrome: Secondary | ICD-10-CM | POA: Diagnosis present

## 2024-07-22 DIAGNOSIS — Z91041 Radiographic dye allergy status: Secondary | ICD-10-CM

## 2024-07-22 DIAGNOSIS — Z91013 Allergy to seafood: Secondary | ICD-10-CM

## 2024-07-22 DIAGNOSIS — Z886 Allergy status to analgesic agent status: Secondary | ICD-10-CM

## 2024-07-22 DIAGNOSIS — M109 Gout, unspecified: Secondary | ICD-10-CM | POA: Diagnosis present

## 2024-07-22 DIAGNOSIS — Z96653 Presence of artificial knee joint, bilateral: Secondary | ICD-10-CM | POA: Diagnosis present

## 2024-07-22 DIAGNOSIS — G8929 Other chronic pain: Secondary | ICD-10-CM | POA: Diagnosis present

## 2024-07-22 DIAGNOSIS — E039 Hypothyroidism, unspecified: Secondary | ICD-10-CM | POA: Diagnosis present

## 2024-07-22 DIAGNOSIS — I700 Atherosclerosis of aorta: Secondary | ICD-10-CM | POA: Diagnosis present

## 2024-07-22 DIAGNOSIS — B957 Other staphylococcus as the cause of diseases classified elsewhere: Secondary | ICD-10-CM | POA: Diagnosis present

## 2024-07-22 DIAGNOSIS — I2510 Atherosclerotic heart disease of native coronary artery without angina pectoris: Secondary | ICD-10-CM | POA: Diagnosis present

## 2024-07-22 DIAGNOSIS — M1990 Unspecified osteoarthritis, unspecified site: Secondary | ICD-10-CM | POA: Diagnosis present

## 2024-07-22 DIAGNOSIS — E872 Acidosis, unspecified: Secondary | ICD-10-CM | POA: Diagnosis present

## 2024-07-22 DIAGNOSIS — Z823 Family history of stroke: Secondary | ICD-10-CM

## 2024-07-22 DIAGNOSIS — F01B4 Vascular dementia, moderate, with anxiety: Secondary | ICD-10-CM | POA: Diagnosis present

## 2024-07-22 DIAGNOSIS — Z833 Family history of diabetes mellitus: Secondary | ICD-10-CM

## 2024-07-22 DIAGNOSIS — Z950 Presence of cardiac pacemaker: Secondary | ICD-10-CM

## 2024-07-22 DIAGNOSIS — N1832 Chronic kidney disease, stage 3b: Secondary | ICD-10-CM | POA: Diagnosis present

## 2024-07-22 DIAGNOSIS — Z860100 Personal history of colon polyps, unspecified: Secondary | ICD-10-CM

## 2024-07-22 DIAGNOSIS — K219 Gastro-esophageal reflux disease without esophagitis: Secondary | ICD-10-CM | POA: Diagnosis present

## 2024-07-22 DIAGNOSIS — Z8673 Personal history of transient ischemic attack (TIA), and cerebral infarction without residual deficits: Secondary | ICD-10-CM

## 2024-07-22 LAB — CBC WITH DIFFERENTIAL/PLATELET
Abs Immature Granulocytes: 0.02 K/uL (ref 0.00–0.07)
Basophils Absolute: 0 K/uL (ref 0.0–0.1)
Basophils Relative: 1 %
Eosinophils Absolute: 0 K/uL (ref 0.0–0.5)
Eosinophils Relative: 1 %
HCT: 40.6 % (ref 36.0–46.0)
Hemoglobin: 13.1 g/dL (ref 12.0–15.0)
Immature Granulocytes: 0 %
Lymphocytes Relative: 34 %
Lymphs Abs: 1.8 K/uL (ref 0.7–4.0)
MCH: 31.6 pg (ref 26.0–34.0)
MCHC: 32.3 g/dL (ref 30.0–36.0)
MCV: 98.1 fL (ref 80.0–100.0)
Monocytes Absolute: 0.4 K/uL (ref 0.1–1.0)
Monocytes Relative: 7 %
Neutro Abs: 3.1 K/uL (ref 1.7–7.7)
Neutrophils Relative %: 57 %
Platelets: 167 K/uL (ref 150–400)
RBC: 4.14 MIL/uL (ref 3.87–5.11)
RDW: 12.3 % (ref 11.5–15.5)
WBC: 5.4 K/uL (ref 4.0–10.5)
nRBC: 0 % (ref 0.0–0.2)

## 2024-07-22 LAB — RESP PANEL BY RT-PCR (RSV, FLU A&B, COVID)  RVPGX2
Influenza A by PCR: NEGATIVE
Influenza B by PCR: NEGATIVE
Resp Syncytial Virus by PCR: NEGATIVE
SARS Coronavirus 2 by RT PCR: NEGATIVE

## 2024-07-22 LAB — COMPREHENSIVE METABOLIC PANEL WITH GFR
ALT: 36 U/L (ref 0–44)
AST: 52 U/L — ABNORMAL HIGH (ref 15–41)
Albumin: 4.5 g/dL (ref 3.5–5.0)
Alkaline Phosphatase: 127 U/L — ABNORMAL HIGH (ref 38–126)
Anion gap: 14 (ref 5–15)
BUN: 55 mg/dL — ABNORMAL HIGH (ref 8–23)
CO2: 23 mmol/L (ref 22–32)
Calcium: 9.4 mg/dL (ref 8.9–10.3)
Chloride: 103 mmol/L (ref 98–111)
Creatinine, Ser: 1.88 mg/dL — ABNORMAL HIGH (ref 0.44–1.00)
GFR, Estimated: 26 mL/min — ABNORMAL LOW (ref >60)
Glucose, Bld: 136 mg/dL — ABNORMAL HIGH (ref 70–99)
Potassium: 4.2 mmol/L (ref 3.5–5.1)
Sodium: 140 mmol/L (ref 135–145)
Total Bilirubin: 1.1 mg/dL (ref 0.0–1.2)
Total Protein: 8.8 g/dL — ABNORMAL HIGH (ref 6.5–8.1)

## 2024-07-22 LAB — URINALYSIS, W/ REFLEX TO CULTURE (INFECTION SUSPECTED)
Bilirubin Urine: NEGATIVE
Glucose, UA: NEGATIVE mg/dL
Hgb urine dipstick: NEGATIVE
Ketones, ur: NEGATIVE mg/dL
Nitrite: NEGATIVE
Protein, ur: NEGATIVE mg/dL
Specific Gravity, Urine: 1.013 (ref 1.005–1.030)
pH: 5 (ref 5.0–8.0)

## 2024-07-22 LAB — CBG MONITORING, ED
Glucose-Capillary: 46 mg/dL — ABNORMAL LOW (ref 70–99)
Glucose-Capillary: 115 mg/dL — ABNORMAL HIGH (ref 70–99)
Glucose-Capillary: 250 mg/dL — ABNORMAL HIGH (ref 70–99)

## 2024-07-22 LAB — LACTIC ACID, PLASMA
Lactic Acid, Venous: 2.1 mmol/L (ref 0.5–1.9)
Lactic Acid, Venous: 1.8 mmol/L (ref 0.5–1.9)

## 2024-07-22 MED ORDER — ALBUTEROL SULFATE (2.5 MG/3ML) 0.083% IN NEBU: 2.5000 mg | INHALATION_SOLUTION | Freq: Four times a day (QID) | RESPIRATORY_TRACT | Status: DC | PRN

## 2024-07-22 MED ORDER — ACETAMINOPHEN 650 MG RE SUPP
650.0000 mg | Freq: Four times a day (QID) | RECTAL | Status: DC | PRN
Start: 2024-07-22 — End: 2024-07-29

## 2024-07-22 MED ORDER — DEXTROSE-SODIUM CHLORIDE 5-0.9 % IV SOLN: INTRAVENOUS | Status: DC

## 2024-07-22 MED ORDER — ACETAMINOPHEN 325 MG PO TABS
650.0000 mg | ORAL_TABLET | Freq: Four times a day (QID) | ORAL | Status: DC | PRN
  Administered 2024-07-22: 650 mg via ORAL
  Filled 2024-07-22: qty 2

## 2024-07-22 MED ORDER — SODIUM CHLORIDE 0.9 % IV BOLUS
500.0000 mL | Freq: Once | INTRAVENOUS | Status: AC
  Administered 2024-07-22: 500 mL via INTRAVENOUS

## 2024-07-22 MED ORDER — INSULIN ASPART 100 UNIT/ML IJ SOLN
0.0000 [IU] | Freq: Every day | INTRAMUSCULAR | Status: DC
  Administered 2024-07-22: 2 [IU] via SUBCUTANEOUS
  Administered 2024-07-23 – 2024-07-24 (×2): 3 [IU] via SUBCUTANEOUS
  Administered 2024-07-26: 5 [IU] via SUBCUTANEOUS
  Filled 2024-07-22: qty 5
  Filled 2024-07-22: qty 2
  Filled 2024-07-22 (×2): qty 3

## 2024-07-22 MED ORDER — MIDODRINE HCL 5 MG PO TABS
5.0000 mg | ORAL_TABLET | Freq: Three times a day (TID) | ORAL | Status: DC
  Administered 2024-07-23 – 2024-07-26 (×11): 5 mg via ORAL
  Filled 2024-07-22 (×11): qty 1

## 2024-07-22 MED ORDER — MELATONIN 5 MG PO TABS
5.0000 mg | ORAL_TABLET | Freq: Every evening | ORAL | Status: DC | PRN
  Administered 2024-07-23: 5 mg via ORAL
  Filled 2024-07-22: qty 1

## 2024-07-22 MED ORDER — INSULIN ASPART 100 UNIT/ML IJ SOLN
0.0000 [IU] | Freq: Three times a day (TID) | INTRAMUSCULAR | Status: DC
  Administered 2024-07-23 (×2): 3 [IU] via SUBCUTANEOUS
  Administered 2024-07-23 – 2024-07-24 (×2): 1 [IU] via SUBCUTANEOUS
  Administered 2024-07-24 – 2024-07-25 (×2): 3 [IU] via SUBCUTANEOUS
  Administered 2024-07-25: 4 [IU] via SUBCUTANEOUS
  Administered 2024-07-25: 1 [IU] via SUBCUTANEOUS
  Administered 2024-07-26 – 2024-07-27 (×3): 2 [IU] via SUBCUTANEOUS
  Administered 2024-07-27: 4 [IU] via SUBCUTANEOUS
  Administered 2024-07-28: 2 [IU] via SUBCUTANEOUS
  Administered 2024-07-29: 3 [IU] via SUBCUTANEOUS
  Filled 2024-07-22 (×3): qty 2
  Filled 2024-07-22: qty 5
  Filled 2024-07-22: qty 3
  Filled 2024-07-22: qty 1
  Filled 2024-07-22 (×2): qty 3
  Filled 2024-07-22: qty 1
  Filled 2024-07-22: qty 4
  Filled 2024-07-22: qty 3
  Filled 2024-07-22: qty 2
  Filled 2024-07-22: qty 3

## 2024-07-22 MED ORDER — HYDROCODONE-ACETAMINOPHEN 5-325 MG PO TABS
1.0000 | ORAL_TABLET | ORAL | Status: DC | PRN
  Administered 2024-07-23: 1 via ORAL
  Filled 2024-07-22: qty 1

## 2024-07-22 MED ORDER — ONDANSETRON HCL 4 MG/2ML IJ SOLN
4.0000 mg | Freq: Four times a day (QID) | INTRAMUSCULAR | Status: DC | PRN
Start: 2024-07-22 — End: 2024-07-29
  Administered 2024-07-25: 4 mg via INTRAVENOUS
  Filled 2024-07-22: qty 2

## 2024-07-22 MED ORDER — LINEZOLID 600 MG/300ML IV SOLN
600.0000 mg | Freq: Two times a day (BID) | INTRAVENOUS | Status: DC
  Administered 2024-07-23 – 2024-07-24 (×4): 600 mg via INTRAVENOUS
  Filled 2024-07-22 (×5): qty 300

## 2024-07-22 MED ORDER — APIXABAN 2.5 MG PO TABS
2.5000 mg | ORAL_TABLET | Freq: Two times a day (BID) | ORAL | Status: DC
  Administered 2024-07-23 – 2024-07-29 (×13): 2.5 mg via ORAL
  Filled 2024-07-22 (×14): qty 1

## 2024-07-22 MED ORDER — LINEZOLID 600 MG/300ML IV SOLN
600.0000 mg | Freq: Once | INTRAVENOUS | Status: AC
  Administered 2024-07-22: 600 mg via INTRAVENOUS
  Filled 2024-07-22: qty 300

## 2024-07-22 MED ORDER — AMIODARONE HCL 200 MG PO TABS
100.0000 mg | ORAL_TABLET | Freq: Every day | ORAL | Status: DC
  Administered 2024-07-23 – 2024-07-29 (×7): 100 mg via ORAL
  Filled 2024-07-22 (×7): qty 1

## 2024-07-22 MED ORDER — ONDANSETRON HCL 4 MG PO TABS: 4.0000 mg | ORAL_TABLET | Freq: Four times a day (QID) | ORAL | Status: DC | PRN

## 2024-07-22 NOTE — ED Triage Notes (Signed)
 Pt to Ed via POV from [facility]. Pt reports saw the primary care at facility 4wks ago after bladder pain and she gave urine sample. Pt reports was placed on antibiotics without results and then finally came back as + for MRSA. Pt sent for IV antibiotics

## 2024-07-22 NOTE — H&P (Signed)
 History and Physical    Patient: Deborah Obrien FMW:[id number] DOB: [birth year] DOA: 07/22/2024 DOS: the patient was seen and examined on 07/22/2024 PCP: Cordella Corning, FNP  Patient coming from: ALF/ILF  Chief Complaint: dysuria, MRSA urine culture  HPI: Deborah Obrien is a 83 y.o. female with medical history significant for parox Afib/flutter on apixaban , SND s/p PPM, HFpEF, CAD, CKD lllb, T2DM, HTN, hypothyroid , anxiety, vascular dementia, history of hemorrhoidal Bleed 12/2023, being admitted with an MRSA urinary tract infection seen on repeat culture after she failed to improve with interrupted Cipro  outpatient treatment in the setting of a month of persistent dysuria, poor oral intake and generalized malaise.  She denies fever or or chills.  Denies abdominal pain or change in bowel habit.  Does endorse a mild cough without shortness of breath.  Patient has multiple antibiotic allergies (macrolides, sulfas, tetracycline) and was sent from her assisted living facility at [facility] for inpatient management. In the ED, afebrile BP initially soft at 100/67, improving to 134/57 with an NS bolus, pulse in the 60s to 70s. WBC 5.4 and lactic acid 1.8 and UA showing trace leuks and rare bacteria. Outpatient urine culture unavailable in Care Everywhere. Hypoglycemic on CBG to 46, improved with orange juice and a snack to 115 CBC otherwise completely within normal limits Respiratory viral panel negative CMP notable for creatinine 1.88 above baseline of 1.4 to, mildly elevated AST to 52 and alk phos 127 chest x-ray nonacute  Patient started on linezolid, given a fluid bolus, hypoglycemia treated with orange juice and snacks  Admission requested     Past Medical History:  Diagnosis Date   Acute respiratory failure (HCC) 04/09/2018   Anemia    Anxiety    Aortic atherosclerosis    Bowel obstruction (HCC)    Carotid artery disease    Cerebral microvascular disease    Chronic heart  failure with preserved ejection fraction (HFpEF) (HCC)    a.) TTE 04/10/2018: EF 60-65%, mildly dil LA/RA, mod dil RV, mod TR, PASP ; b.) TTE 01/22/2023: EF 60-65%, no rwma, low-nl RV fxn, RVSP 51.2 mmHg. Mild MR. Mod-sev TR.   CKD (chronic kidney disease), stage IV (HCC)    Closed fracture of one rib of right side 05/29/2020   Colon polyp    Complete tear of left rotator cuff 10/16/2014   Coronary artery disease (non-obstructive) 08/25/2016   a.) LHC 08/25/2016:  25% oLM-LM, 20% mLAD, 15% o-pLCx - med mgmt; b.) MV 10/20/2017: no isch/infarct, EF 55-65%   DDD (degenerative disc disease), cervical    a.) s/p ACDF C6-C7   Depression    Diabetic neuropathy (HCC)    Diarrhea 03/04/2022   Displaced intertrochanteric fracture of right femur (HCC)    Fatty liver    Gastritis without bleeding    GERD (gastroesophageal reflux disease)    GI bleed    Gout    Hypertension    Hypothyroidism    Intractable nausea and vomiting 05/25/2024   Long term current use of amiodarone     Low back pain    history of kyphoplasty t12-l1   Morbid obesity (HCC)    Multiple gastric polyps    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   a.) presurgical PCR (+) 10/10/2023 prior to REMOVAL OF NAIL HARDWARE WITH SCREW EXCHANGE (RIGHT HIP)   On apixaban  therapy    Osteoarthritis    Osteoporosis    PAF (paroxysmal atrial fibrillation) (HCC) 2016   a.) CHA2DS2-VASc = 9 (age x 2,  sex, HFpEF, HTN, TIA x 2, vascular disease, T2DM) as of 10/13/2023; b.) s/p DCCV 06/28/2017 (120 J x1), 02/06/2018 (150c J x 1), 07/15/2020 (75 J x ); c.) ardiac rate/rhythm maintained on oral amiodarone ; chronically anticoagulated using apixaban    Presence of permanent cardiac pacemaker 07/01/2015   a.) PPM placed 07/01/2015; b.) device upgraded to dual chamber MDT Azure XT DR MRI SureScan (SN: [id number]) 08/29/2017   Pulmonary hypertension (HCC)    a.) TTE 04/10/2018: PASP 50 mmHg; b.) TTE 01/22/2023: RVSP 51.2 mmHg   Sinus node  dysfunction (HCC)    a.) s/p PPM placement 06/2015 --> device upgraded 08/2017   T2DM (type 2 diabetes mellitus) (HCC)    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   Uncontrolled type 2 diabetes mellitus with hypoglycemia, with long-term current use of insulin  (HCC) 03/04/2022   Urinary incontinence 10/17/2022   Vascular dementia, moderate, with anxiety [facility])    Past Surgical History:  Procedure Laterality Date   ABDOMINAL HYSTERECTOMY  1980   APPENDECTOMY     BACK SURGERY     2008. plate & screws    BACK SURGERY  2019   patient describes kyphoplasty for compression fractures, MD referral said fusion   BREAST BIOPSY Right 04/2015   stereo fibroadenomatous change, neg for atypia   BREAST EXCISIONAL BIOPSY Left    neg   CARDIAC CATHETERIZATION Left 08/25/2016   Procedure: Left Heart Cath and Coronary Angiography;  Surgeon: Wolm JINNY Rhyme, MD;  Location: [facility] INVASIVE CV LAB;  Service: Cardiovascular;  Laterality: Left;   CARDIOVERSION N/A 06/28/2017   Procedure: CARDIOVERSION;  Surgeon: Rhyme Wolm JINNY, MD;  Location: [facility] ORS;  Service: Cardiovascular;  Laterality: N/A;   CARDIOVERSION N/A 02/06/2018   Procedure: CARDIOVERSION;  Surgeon: Delford Maude BROCKS, MD;  Location: [facility] ENDOSCOPY;  Service: Cardiovascular;  Laterality: N/A;   CARDIOVERSION N/A 07/15/2020   Procedure: CARDIOVERSION;  Surgeon: Jeffrie Oneil BROCKS, MD;  Location: [facility] ENDOSCOPY;  Service: Cardiovascular;  Laterality: N/A;   CATARACT EXTRACTION, BILATERAL  2012   CHOLECYSTECTOMY     colon blockage  1999   COLON SURGERY  2000   removed 20% of colon. colon had collapsed   COLONOSCOPY  2016   polyps removed 2016   COLONOSCOPY N/A 01/10/2024   Procedure: COLONOSCOPY;  Surgeon: Jinny Carmine, MD;  Location: [facility] ENDOSCOPY;  Service: Endoscopy;  Laterality: N/A;   DORSAL COMPARTMENT RELEASE Left  09/14/2016   Procedure: RELEASE DORSAL COMPARTMENT (DEQUERVAIN);  Surgeon: Lynwood SHAUNNA Hue, MD;  Location: [facility] ORS;  Service: Orthopedics;  Laterality: Left;   ESOPHAGOGASTRODUODENOSCOPY N/A 01/27/2015   Procedure: ESOPHAGOGASTRODUODENOSCOPY (EGD);  Surgeon: Gladis RAYMOND Mariner, MD;  Location: [facility] ENDOSCOPY;  Service: Endoscopy;  Laterality: N/A;   ESOPHAGOGASTRODUODENOSCOPY N/A 01/10/2024   Procedure: EGD (ESOPHAGOGASTRODUODENOSCOPY);  Surgeon: Jinny Carmine, MD;  Location: [facility] ENDOSCOPY;  Service: Endoscopy;  Laterality: N/A;   ESOPHAGOGASTRODUODENOSCOPY N/A 05/29/2024   Procedure: EGD (ESOPHAGOGASTRODUODENOSCOPY);  Surgeon: Jinny Carmine, MD;  Location: [facility] ENDOSCOPY;  Service: Endoscopy;  Laterality: N/A;   ESOPHAGOGASTRODUODENOSCOPY (EGD) WITH PROPOFOL  N/A 07/24/2015   Procedure: ESOPHAGOGASTRODUODENOSCOPY (EGD) WITH PROPOFOL ;  Surgeon: Carmine Jinny, MD;  Location: [facility] ENDOSCOPY;  Service: Endoscopy;  Laterality: N/A;   ESOPHAGOGASTRODUODENOSCOPY (EGD) WITH PROPOFOL  N/A 04/12/2018   Procedure: ESOPHAGOGASTRODUODENOSCOPY (EGD) WITH PROPOFOL ;  Surgeon: Therisa Bi, MD;  Location: [facility] ENDOSCOPY;  Service: Gastroenterology;  Laterality: N/A;   ESOPHAGOGASTRODUODENOSCOPY (EGD) WITH PROPOFOL   N/A 06/22/2018   Procedure: ESOPHAGOGASTRODUODENOSCOPY (EGD) WITH PROPOFOL ;  Surgeon: Teressa Toribio SQUIBB, MD;  Location: [facility] ENDOSCOPY;  Service: Endoscopy;  Laterality: N/A;   ESOPHAGOGASTRODUODENOSCOPY (EGD) WITH PROPOFOL  N/A 07/09/2018   Procedure: ESOPHAGOGASTRODUODENOSCOPY (EGD) WITH PROPOFOL  with resection of gastric polyps;  Surgeon: Therisa Bi, MD;  Location: [facility] ENDOSCOPY;  Service: Gastroenterology;  Laterality: N/A;   ESOPHAGOGASTRODUODENOSCOPY (EGD) WITH PROPOFOL  N/A 05/17/2019   Procedure: ESOPHAGOGASTRODUODENOSCOPY (EGD) WITH PROPOFOL ;  Surgeon: Therisa Bi, MD;  Location: [facility] ENDOSCOPY;  Service: Gastroenterology;  Laterality: N/A;   EUS N/A 06/22/2018   Procedure: UPPER ENDOSCOPIC ULTRASOUND (EUS)  RADIAL;  Surgeon: Teressa Toribio SQUIBB, MD;  Location: [facility] ENDOSCOPY;  Service: Endoscopy;  Laterality: N/A;   EYE SURGERY Bilateral    cataract extractions   HARDWARE REMOVAL Right 10/17/2023   Procedure: Right hip removal of nail hardware with screw exchange;  Surgeon: Tobie Priest, MD;  Location: [facility] ORS;  Service: Orthopedics;  Laterality: Right;   HOT HEMOSTASIS  05/29/2024   Procedure: EGD, WITH ARGON PLASMA COAGULATION;  Surgeon: Jinny Carmine, MD;  Location: [facility] ENDOSCOPY;  Service: Endoscopy;;   HUMERUS IM NAIL Left 06/02/2022   Procedure: INTRAMEDULLARY (IM) NAIL HUMERAL;  Surgeon: Edie Norleen PARAS, MD;  Location: [facility] ORS;  Service: Orthopedics;  Laterality: Left;   INTRAMEDULLARY (IM) NAIL INTERTROCHANTERIC Right 01/21/2023   Procedure: INTRAMEDULLARY (IM) NAIL INTERTROCHANTERIC;  Surgeon: Tobie Priest, MD;  Location: [facility] ORS;  Service: Orthopedics;  Laterality: Right;   JOINT REPLACEMENT Bilateral 2014   Bilateral Knee replacement   LEAD REVISION/REPAIR N/A 08/29/2017   Procedure: LEAD REVISION/REPAIR;  Surgeon: Inocencio Soyla Lunger, MD;  Location: [facility] INVASIVE CV LAB;  Service: Cardiovascular;  Laterality: N/A;   OOPHORECTOMY     PACEMAKER INSERTION Left 07/01/2015   Procedure: INSERTION PACEMAKER;  Surgeon: Marsa Dooms, MD;  Location: [facility] ORS;  Service: Cardiovascular;  Laterality: Left;   PACEMAKER REVISION N/A 08/28/2017   Procedure: PACEMAKER REVISION;  Surgeon: Fernande Elspeth BROCKS, MD;  Location: [facility] INVASIVE CV LAB;  Service: Cardiovascular;  Laterality: N/A;   REPLACEMENT TOTAL KNEE BILATERAL     SHOULDER ARTHROSCOPY WITH ROTATOR CUFF REPAIR AND OPEN BICEPS TENODESIS Left 11/21/2019   Procedure: LEFT SHOULDER ARTHROSCOPY WITH DEBRIDEMENT, DECOMPRESSION, ROTATOR CUFF REPAIR AND BICEPS TENOLYSIS;  Surgeon: Edie Norleen PARAS, MD;  Location: [facility] ORS;  Service: Orthopedics;  Laterality: Left;   SHOULDER ARTHROSCOPY WITH SUBACROMIAL DECOMPRESSION Left 2013   TEE WITHOUT CARDIOVERSION  N/A 06/28/2017   Procedure: TRANSESOPHAGEAL ECHOCARDIOGRAM (TEE);  Surgeon: Hester Wolm PARAS, MD;  Location: [facility] ORS;  Service: Cardiovascular;  Laterality: N/A;   TUBAL LIGATION     Social History:  reports that she has never smoked. She has never used smokeless tobacco. She reports that she does not drink alcohol  and does not use drugs.  Allergies  Allergen Reactions   Fish Allergy Shortness Of Breath    All kinds of fish. Severe vomitting even with smell of fish.    Macrolides And Ketolides Other (See Comments)    Severe stomach pain (mycins)   Meperidine Shortness Of Breath, Nausea Only and Other (See Comments)    Stomach pain    Other Nausea Only, Swelling, Rash, Anaphylaxis, Diarrhea, Shortness Of Breath and Other (See Comments)    Allergen: NON-STEROIDS, bextra - hands and feet swell  Reaction:  Stomach pain   Other Reaction: Intolerance   Prednisone Anaphylaxis and Other (See Comments)    (facial swelling/redness/burning)Increases pts blood sugar  Pt states that she is allergic to all steroids.  Shellfish Allergy Anaphylaxis, Shortness Of Breath, Diarrhea, Nausea Only and Rash    Stomach pain    Sulfa Antibiotics Shortness Of Breath, Diarrhea, Nausea Only and Other (See Comments)    Reaction:  Stomach pain     Sulfacetamide Sodium Diarrhea, Nausea Only, Other (See Comments) and Shortness Of Breath    Reaction:  Stomach pain    Telbivudine Other (See Comments)    Unknown reaction   Uloric [Febuxostat] Anaphylaxis    Locks pt's body up    Aspirin  Rash and Other (See Comments)    Stomach pain (tolerates lower doses)   Celecoxib Other (See Comments)    GI bleed, weakness, and stomach pain.     Cephalexin Diarrhea, Nausea Only and Other (See Comments)    stomach pain    Codeine Diarrhea, Nausea Only and Other (See Comments)    Reaction:  Stomach pain Pt tolerates morphine     Dilaudid  [Hydromorphone  Hcl] Other (See Comments)    Reactions: easy to overdose - blood  pressure drops really low   Erythromycin Diarrhea, Nausea Only and Other (See Comments)    Reaction:  Stomach pain    Iodinated Contrast Media Rash and Other (See Comments)    Pt states that she is unable to have because she has chronic kidney disease.     Lipitor [Atorvastatin Calcium ] Nausea Only    Reaction: nausea, weakness, pass blood   Oxycodone  Other (See Comments)    Reaction:  Stomach pain    Pseudoephedrine    Aleve [Naproxen]     Reaction: severe stomach pain   Atorvastatin Nausea Only and Other (See Comments)    Weakness    Cephalosporins Diarrhea, Nausea Only and Other (See Comments)    Other: abdominal pain  Tolerated 1st generation cephalosporin (CEFAZOLIN ) on 06/02/2022 and 01/21/2023 without documented ADRs.    Doxycycline     Stomach pain    Iodine Rash   Motrin [Ibuprofen]     Reaction: severe stomach pain   Tape Other (See Comments)    Causes pts skin to tear  Pt states that she is able to use paper tape.       Valdecoxib Swelling and Other (See Comments)    Pt states that her hands and feet swell.      Family History  Problem Relation Age of Onset   Diabetes Mellitus II Mother    CAD Mother    Heart attack Mother    Cancer Father        skin   Diabetes Mellitus II Brother    Stroke Brother    Breast cancer Neg Hx     Prior to Admission medications   Medication Sig Start Date End Date Taking? Authorizing Provider  acetaminophen  (TYLENOL ) 500 MG tablet Take 2 tablets (1,000 mg total) by mouth every 8 (eight) hours. 10/17/23 10/16/24  Tobie Priest, MD  albuterol  (VENTOLIN  HFA) [phone] Base) MCG/ACT inhaler Inhale 2 puffs into the lungs every 6 (six) hours as needed for wheezing or shortness of breath (cough).    [provider]  allopurinol  (ZYLOPRIM ) 100 MG tablet Take 100 mg by mouth in the morning.    [provider]  alum & mag hydroxide-simeth (MAALOX/MYLANTA) 200-200-20 MG/5ML suspension Take 10 mLs by mouth 3 (three) times daily  as needed for indigestion or heartburn.    [provider]  amiodarone  (PACERONE ) 200 MG tablet Take 0.5 tablets (100 mg total) by mouth daily. Patient taking differently: Take 200 mg by mouth  daily. 07/11/23   Riddle, Suzann, NP  apixaban  (ELIQUIS ) 2.5 MG TABS tablet Take 1 tablet (2.5 mg total) by mouth 2 (two) times daily. 03/06/22   Alexander, Natalie, DO  Cholecalciferol  (VITAMIN D ) 50 MCG (2000 UT) CAPS Take 2,000 Units by mouth in the morning.    [provider]  dicyclomine  (BENTYL ) 10 MG capsule Take 2 capsules (20 mg total) by mouth 3 (three) times daily before meals. 05/31/24   Jhonny Calvin NOVAK, MD  docusate sodium  (COLACE) 100 MG capsule Take 100 mg by mouth 2 (two) times daily.    [provider]  famotidine  (PEPCID ) 10 MG tablet Take 2 tablets (20 mg total) by mouth 2 (two) times daily. Patient taking differently: Take 10 mg by mouth daily. 05/14/24 06/20/24  Viviann Pastor, MD  Fluticasone  Furoate (FLONASE  SENSIMIST NA) Place 1 spray into the nose in the morning.    [provider]  furosemide  (LASIX ) 20 MG tablet Take 1 tablet (20 mg total) by mouth daily. 02/02/23   Josette Ade, MD  gabapentin  (NEURONTIN ) 100 MG capsule Take 100 mg by mouth 3 (three) times daily.    [provider]  ibandronate (BONIVA) 150 MG tablet Take 150 mg by mouth every 30 (thirty) days. Take in the morning with a full glass of water , on an empty stomach, and do not take anything else by mouth or lie down for the next 30 min.    [provider]  LANTUS  100 UNIT/ML injection Inject 0.19 mLs (19 Units total) into the skin at bedtime. Patient taking differently: Inject 17 Units into the skin in the morning. 01/11/24   Alexander, Natalie, DO  magnesium  hydroxide (MILK OF MAGNESIA) 400 MG/5ML suspension Take 30 mLs by mouth daily as needed (constipation).    [provider]  methocarbamol  (ROBAXIN ) 500 MG tablet Take 1 tablet (500 mg total) by mouth  3 (three) times daily. 05/31/24   Jhonny Calvin NOVAK, MD  midodrine  (PROAMATINE ) 5 MG tablet Take by mouth 3 (three) times daily with meals. *DO NOT LIE DOWN AFTER TAKING, DO NOT TAKE AFTER EVENING MEAL. TAKE LAST DOSE OF THE DAY AT LEAST 4HOURS BEFORE BEDTIME* 05/14/24   [provider]  MUCINEX  MAXIMUM STRENGTH 1200 MG TB12 Take 1 tablet by mouth 2 (two) times daily. 05/06/24   [provider]  Multiple Vitamin (MULTIVITAMIN WITH MINERALS) TABS tablet Take 1 tablet by mouth daily. 02/02/23   Wieting, Richard, MD  NOVOLIN R FLEXPEN RELION 100 UNIT/ML FlexPen Inject 1-10 Units into the skin 3 (three) times daily with meals. Inject subcutaneously Sig: CHECK SUGARS BEFORE MEAL TIME & TAKE INSULIN  BASED ON SLIDING SCALE: <70=NO INSULIN , EAT A SNACK AND RECHECK SUGAR SOON. 70-120=0U, 121-150=2U, 151-200=4U, 201-250=5U, 251-300=6U, 301-350=8U, 351-400=10U, >400=CALL MD 05/14/24   [provider]  nystatin  powder Apply 1 Application topically in the morning and at bedtime. (0800 & 2000) Apply topically to groin    [provider]  ondansetron  (ZOFRAN -ODT) 4 MG disintegrating tablet Take 1 tablet (4 mg total) by mouth every 8 (eight) hours as needed for nausea or vomiting. Patient not taking: Reported on 06/20/2024 10/17/23   Tobie Priest, MD  pantoprazole  (PROTONIX ) 40 MG tablet Take 1 tablet (40 mg total) by mouth 2 (two) times daily before a meal. 05/31/24   Sreenath, Calvin B, MD  polyethylene glycol (MIRALAX  / GLYCOLAX ) 17 g packet Take 17 g by mouth daily. 02/01/23   Josette Ade, MD  Polyvinyl Alcohol -Povidone PF 1.4-0.6 % SOLN Place  1 drop into both eyes in the morning and at bedtime.    [provider]  rOPINIRole  (REQUIP ) 0.5 MG tablet Take 0.5 mg by mouth at bedtime. (2000)    [provider]  senna (SENOKOT) 8.6 MG tablet Take 1 tablet (8.6 mg total) by mouth at bedtime as needed for constipation. 01/11/24   Alexander, Natalie, DO  sertraline  (ZOLOFT )  100 MG tablet Take 100 mg by mouth in the morning. (0800)    [provider]  solifenacin (VESICARE) 5 MG tablet Take 5 mg by mouth in the morning. (0800)    [provider]  sucralfate  (CARAFATE ) 1 g tablet Take 1 tablet (1 g total) by mouth 4 (four) times daily. 05/14/24   Viviann Pastor, MD    Physical Exam: Vitals:   07/22/24 1413 07/22/24 1707 07/22/24 1830 07/22/24 1930  BP: 100/67 (!) 114/51 131/60 (!) 134/57  Pulse: 79 66 (!) 59 61  Resp: 18 16 14    Temp: 97.6 F (36.4 C)     TempSrc: Oral     SpO2: 98% 100% 100% 97%   Physical Exam Vitals and nursing note reviewed.  Constitutional:      General: She is not in acute distress. HENT:     Head: Normocephalic and atraumatic.  Cardiovascular:     Rate and Rhythm: Normal rate and regular rhythm.     Heart sounds: Normal heart sounds.  Pulmonary:     Effort: Pulmonary effort is normal.     Breath sounds: Normal breath sounds.  Abdominal:     Palpations: Abdomen is soft.     Tenderness: There is no abdominal tenderness.  Neurological:     Mental Status: Mental status is at baseline.     Labs on Admission: I have personally reviewed following labs and imaging studies  CBC: Recent Labs  Lab 07/22/24 1415  WBC 5.4  NEUTROABS 3.1  HGB 13.1  HCT 40.6  MCV 98.1  PLT 167   Basic Metabolic Panel: Recent Labs  Lab 07/22/24 1415  NA 140  K 4.2  CL 103  CO2 23  GLUCOSE 136*  BUN 55*  CREATININE 1.88*  CALCIUM  9.4   GFR: CrCl cannot be calculated (Unknown ideal weight.). Liver Function Tests: Recent Labs  Lab 07/22/24 1415  AST 52*  ALT 36  ALKPHOS 127*  BILITOT 1.1  PROT 8.8*  ALBUMIN  4.5   No results for input(s): LIPASE, AMYLASE in the last 168 hours. No results for input(s): AMMONIA in the last 168 hours. Coagulation Profile: No results for input(s): INR, PROTIME in the last 168 hours. Cardiac Enzymes: No results for input(s): CKTOTAL, CKMB, CKMBINDEX,  TROPONINI in the last 168 hours. BNP (last 3 results) No results for input(s): PROBNP in the last 8760 hours. HbA1C: No results for input(s): HGBA1C in the last 72 hours. CBG: Recent Labs  Lab 07/22/24 1601 07/22/24 1701  GLUCAP 46* 115*   Lipid Profile: No results for input(s): CHOL, HDL, LDLCALC, TRIG, CHOLHDL, LDLDIRECT in the last 72 hours. Thyroid  Function Tests: No results for input(s): TSH, T4TOTAL, FREET4, T3FREE, THYROIDAB in the last 72 hours. Anemia Panel: No results for input(s): VITAMINB12, FOLATE, FERRITIN, TIBC, IRON , RETICCTPCT in the last 72 hours. Urine analysis:    Component Value Date/Time   COLORURINE YELLOW (A) 07/22/2024 1739   APPEARANCEUR CLEAR (A) 07/22/2024 1739   APPEARANCEUR Hazy 10/29/2012 1534   LABSPEC 1.013 07/22/2024 1739   LABSPEC 1.010 10/29/2012 1534   PHURINE 5.0 07/22/2024 1739  GLUCOSEU NEGATIVE 07/22/2024 1739   GLUCOSEU Negative 10/29/2012 1534   HGBUR NEGATIVE 07/22/2024 1739   BILIRUBINUR NEGATIVE 07/22/2024 1739   BILIRUBINUR Negative 10/29/2012 1534   KETONESUR NEGATIVE 07/22/2024 1739   PROTEINUR NEGATIVE 07/22/2024 1739   NITRITE NEGATIVE 07/22/2024 1739   LEUKOCYTESUR TRACE (A) 07/22/2024 1739   LEUKOCYTESUR Negative 10/29/2012 1534    Radiological Exams on Admission: DG Chest 2 View Result Date: 07/22/2024 EXAM: 2 VIEW(S) XRAY OF THE CHEST 07/22/2024 04:15:31 PM COMPARISON: Chest x-ray 10/28/2023 CLINICAL HISTORY: cough FINDINGS: LUNGS AND PLEURA: Small branching calcifications are seen in the bilateral upper lobes, similar to prior study. No pulmonary edema. No pleural effusion. No pneumothorax. HEART AND MEDIASTINUM: Left-sided pacemaker is again seen. No acute abnormality of the cardiac and mediastinal silhouettes. BONES AND SOFT TISSUES: There are healed right-sided rib fractures. Cervical spinal fusion plate is present. Lower thoracic vertebroplasty changes are again noted.  IMPRESSION: 1. No acute findings. Electronically signed by: Greig Pique MD 07/22/2024 04:35 PM EST RP Workstation: [id number]   Data Reviewed for HPI: Relevant notes from primary care and specialist visits, past discharge summaries as available in EHR, including Care Everywhere. Prior diagnostic testing as pertinent to current admission diagnoses Updated medications and problem lists for reconciliation ED course, including vitals, labs, imaging, treatment and response to treatment Triage notes, nursing and pharmacy notes and ED provider's notes Notable results as noted above in HPI      Assessment and Plan: * Complicated urinary tract infection, MRSA Multiple antibiotic allergies Mixed stress and urge incontinence Patient with a months of dysuria, decreased oral intake and malaise, failing Cipro  x 2 Urine culture from [facility] resulted with MRSA prompting referral to ED Continue linezolid started in the ED due to multiple antibiotic allergies-Will start with IV and transition to oral for discharge Follow urine culture  Acute renal failure superimposed on stage 3b chronic kidney disease (HCC) Inadequate oral intake due to acute illness Suspect related to poor oral intake secondary to ongoing infection x 3 to 4 weeks IV hydration Monitor renal function and avoid nephrotoxins  Hypoglycemia, with long-term current use of insulin  (HCC) Secondary to decreased oral intake with ongoing insulin  use Hypoglycemic to 46, improved with oral snacks Will hydrate with D5 NS overnight to decrease risk of hypoglycemia until oral intake improves Sliding scale coverage in case blood sugar overshoots Will hold glargine for now  Paroxysmal atrial fibrillation (HCC) History of TIA On chronic anticoagulation with Eliquis  for secondary stroke prevention Continue amiodarone  Statins not noted on med rec  (HFpEF) heart failure with preserved ejection fraction (HCC) Chronic hypotension on  midodrine  Clinically euvolemic Appears to be GDMT intolerant due to hypotension Continue midodrine  Monitor for fluid overload in view of IV hydration  Hypothyroidism Continue levothyroxine   CAD (coronary artery disease) No complaints of chest pain and EKG is nonacute  Sinus node dysfunction s/p permanent cardiac pacemaker No acute issues suspected    DVT prophylaxis: Apixaban   Consults: none  Advance Care Planning:   Code Status: Prior   Family Communication: none  Disposition Plan: Back to previous home environment  Severity of Illness: The appropriate patient status for this patient is OBSERVATION. Observation status is judged to be reasonable and necessary in order to provide the required intensity of service to ensure the patient's safety. The patient's presenting symptoms, physical exam findings, and initial radiographic and laboratory data in the context of their medical condition is felt to place them at decreased risk for further clinical deterioration. Furthermore,  it is anticipated that the patient will be medically stable for discharge from the hospital within 2 midnights of admission.   Author: Delayne LULLA Solian, MD 07/22/2024 8:17 PM  For on call review [email or web address].

## 2024-07-22 NOTE — Assessment & Plan Note (Addendum)
 Inadequate oral intake due to acute illness Suspect related to poor oral intake secondary to ongoing infection x 3 to 4 weeks IV hydration Monitor renal function and avoid nephrotoxins

## 2024-07-22 NOTE — Hospital Course (Signed)
 SABRA

## 2024-07-22 NOTE — ED Provider Notes (Signed)
 [facility] Provider Note    Event Date/Time   First MD Initiated Contact with Patient 07/22/24 1520     (approximate)   History   No chief complaint on file.  Pt to Ed via POV from [employer]. Pt reports saw the primary care at facility 4wks ago after bladder pain and she gave urine sample. Pt reports was placed on antibiotics without results and then finally came back as + for MRSA. Pt sent for IV antibiotics    HPI MACKENZEE BECVAR is a 83 y.o. female PMH multiple medical comorbidities including T2DM, CAD, cardiac pacemaker, A-fib on Eliquis  presents for evaluation of abnormal urine culture - Patient tells me about 4 weeks ago she developed dysuria, about a week later she had a urine test and says she was started on ciprofloxacin  for UTI about 5 to 7 days later.  Took 3 days of ciprofloxacin  and then says this was discontinued for unclear reasons.  Says a nurse practitioner at her facility saw her yesterday, told her that the urine subsequently grew positive for MRSA and that she would need to be sent to the emergency department for evaluation.  Was apparently restarted on ciprofloxacin  yesterday and has also taken a dose today. - No fever.  Does endorse fatigue and generalized malaise.  Does note some nonproductive cough and congestion over the past 3 days or so. - Unfortunately did not bring any accompanying paperwork and I do not have access to patient's reported tests  Per chart review, last admitted in September of this year for nausea and vomiting.       Physical Exam   Triage Vital Signs: ED Triage Vitals [07/22/24 1413]  Encounter Vitals Group     BP 100/67     Girls Systolic BP Percentile      Girls Diastolic BP Percentile      Boys Systolic BP Percentile      Boys Diastolic BP Percentile      Pulse Rate 79     Resp 18     Temp 97.6 F (36.4 C)     Temp Source Oral     SpO2 98 %     Weight      Height      Head Circumference       Peak Flow      Pain Score 4     Pain Loc      Pain Education      Exclude from Growth Chart     Most recent vital signs: Vitals:   07/22/24 1707 07/22/24 1830  BP: (!) 114/51 131/60  Pulse: 66 (!) 59  Resp: 16 14  Temp:    SpO2: 100% 100%     General: Awake, no distress.  CV:  Good peripheral perfusion. RRR, RP 2+ Resp:  Normal effort. CTAB Abd:  No distention. Nontender to deep palpation throughout, mild left CVA tenderness noted    ED Results / Procedures / Treatments   Labs (all labs ordered are listed, but only abnormal results are displayed) Labs Reviewed  LACTIC ACID, PLASMA - Abnormal; Notable for the following components:      Result Value   Lactic Acid, Venous 2.1 (*)    All other components within normal limits  COMPREHENSIVE METABOLIC PANEL WITH GFR - Abnormal; Notable for the following components:   Glucose, Bld 136 (*)    BUN 55 (*)    Creatinine, Ser 1.88 (*)    Total Protein 8.8 (*)  AST 52 (*)    Alkaline Phosphatase 127 (*)    GFR, Estimated 26 (*)    All other components within normal limits  URINALYSIS, W/ REFLEX TO CULTURE (INFECTION SUSPECTED) - Abnormal; Notable for the following components:   Color, Urine YELLOW (*)    APPearance CLEAR (*)    Leukocytes,Ua TRACE (*)    Bacteria, UA RARE (*)    All other components within normal limits  CBG MONITORING, ED - Abnormal; Notable for the following components:   Glucose-Capillary 46 (*)    All other components within normal limits  CBG MONITORING, ED - Abnormal; Notable for the following components:   Glucose-Capillary 115 (*)    All other components within normal limits  RESP PANEL BY RT-PCR (RSV, FLU A&B, COVID)  [id number]  CULTURE, BLOOD (SINGLE)  URINE CULTURE  LACTIC ACID, PLASMA  CBC WITH DIFFERENTIAL/PLATELET     EKG  N/a   RADIOLOGY Radiology interpreted by myself and radiology report reviewed.  No acute pathology identified.    PROCEDURES:  Critical Care performed:  No  Procedures   MEDICATIONS ORDERED IN ED: Medications  linezolid (ZYVOX) IVPB 600 mg (has no administration in time range)  sodium chloride  0.9 % bolus 500 mL (0 mLs Intravenous Stopped 07/22/24 1800)     IMPRESSION / MDM / ASSESSMENT AND PLAN / ED COURSE  I reviewed the triage vital signs and the nursing notes.                              DDX/MDM/AP: Differential diagnosis includes, but is not limited to, possible ongoing UTI or pyelonephritis, do not clinically suspect sepsis at this time.  No clinical concern for other intra-abdominal pathology with very benign abdominal exam patient only noting dysuria.  Consider viral syndrome including influenza or COVID-19 given recent cough and congestion.  Also consider underlying pneumonia.  Consider possible electrolyte abnormality, dehydration.  Plan: - Labs - Chest x-ray - Reassess  Patient's presentation is most consistent with acute presentation with potential threat to life or bodily function.  The patient is on the cardiac monitor to evaluate for evidence of arrhythmia and/or significant heart rate changes.  ED course below.  Workup with evidence of ongoing UTI as well as AKI on CKD.  Given 500 cc IV fluid.  Unfortunately despite multiple attempts we were unable to get outside records, unclear exactly what the prior urine culture showed though patient repeatedly tells me she was found to have a MRSA UTI that was not amenable to oral antibiotics.  Does have multiple allergies listed in her chart, discussed care with pharmacy who recommends IV linezolid, ordered.  Added urine culture as well as single blood culture.  Admitted to hospital service, can follow-up urine culture inpatient and hopefully find appropriate oral regimen.  Clinical Course as of 07/22/24 1953  Mon Jul 22, 2024  1520 Cbc wnl [MM]  1525 CMP reviewed, Cr mildly elevated from 1 month ago [MM]  1615 Appears urinalysis unfortunately is unable to result  Will send  new urinalysis to lab [MM]  1639 CXR: IMPRESSION: 1. No acute findings.   [MM]  1749 Viral swab neg [MM]  1810 Rpt lactate normalized [MM]  1845 Urinalysis with some ongoing evidence of infection [MM]  1857 Urinalysis with some ongoing evidence of UTI, urine culture added  Patient has already been on ciprofloxacin  for 2 days and continues to have UTI symptoms and urinalysis suggestive of  UTI.  Reportedly has MRSA UTI, will broaden antibiotics and plan for admission.  Can follow urine culture tomorrow. [MM]  1916 D/w pharmacy Given multiple allergies reported and mild AKI on CKD recommends linezolid, ordered [MM]    Clinical Course User Index [MM] Clarine Ozell LABOR, MD     FINAL CLINICAL IMPRESSION(S) / ED DIAGNOSES   Final diagnoses:  Acute cystitis without hematuria  AKI (acute kidney injury)     Rx / DC Orders   ED Discharge Orders     None        Note:  This document was prepared using Dragon voice recognition software and may include unintentional dictation errors.   Clarine Ozell LABOR, MD 07/22/24 [phone]

## 2024-07-22 NOTE — Assessment & Plan Note (Addendum)
 Secondary to decreased oral intake with ongoing insulin  use Hypoglycemic to 46, improved with oral snacks Will hydrate with D5 NS overnight to decrease risk of hypoglycemia until oral intake improves Sliding scale coverage in case blood sugar overshoots Will hold glargine for now

## 2024-07-22 NOTE — Assessment & Plan Note (Addendum)
 Multiple antibiotic allergies Mixed stress and urge incontinence Patient with a months of dysuria, decreased oral intake and malaise, failing Cipro  x 2 Urine culture from [facility] resulted with MRSA prompting referral to ED Continue linezolid started in the ED due to multiple antibiotic allergies-Will start with IV and transition to oral for discharge Follow urine culture

## 2024-07-22 NOTE — Assessment & Plan Note (Signed)
 No complaints of chest pain and EKG is nonacute

## 2024-07-22 NOTE — Consult Note (Signed)
 ED Pharmacy Antibiotic Sign Off An antibiotic consult was received from an ED provider for UTI per pharmacy dosing for Zyvox. A chart review was completed to assess appropriateness.   The following one time order(s) were placed:  Zyvox 600mg  IV x1  Further antibiotic and/or antibiotic pharmacy consults should be ordered by the admitting provider if indicated.   Thank you for allowing pharmacy to be a part of this patient's care.   Annabella LOISE Banks, [facility]  Clinical Pharmacist 07/22/24 7:29 PM

## 2024-07-22 NOTE — ED Notes (Signed)
 Pt alerted this nurse she feels like her sugar is low. FSBS performed revealing 46. Pt provided orange juice. Will give snack when back from x-ray. Provider notified.

## 2024-07-22 NOTE — ED Notes (Signed)
 Called lab regarding delayed UA results and per Lab it was received around 1500 and not enough sample to analyze however no staff in the ER was notified. Provider given update and will try another sample when patient can produce.

## 2024-07-22 NOTE — Assessment & Plan Note (Signed)
 Continue levothyroxine 

## 2024-07-22 NOTE — Assessment & Plan Note (Addendum)
 History of TIA On chronic anticoagulation with Eliquis  for secondary stroke prevention Continue amiodarone  Statins not noted on med rec

## 2024-07-22 NOTE — ED Notes (Signed)
 Patient provided box meal and soda. PIV attempt x2 unsuccessful by this nurse. MD notified of difficulty at this time and to order US  IV team consult.

## 2024-07-22 NOTE — Assessment & Plan Note (Signed)
 Chronic hypotension on midodrine  Clinically euvolemic Appears to be GDMT intolerant due to hypotension Continue midodrine  Monitor for fluid overload in view of IV hydration

## 2024-07-22 NOTE — ED Notes (Signed)
 Pt was transferred by this tech and Brittany, NT to new room via wheelchair. Pt placed into hospital gown. Pt provided with new warm blankets and pillow. Pt requesting medication for restless leg. Call light within reach. No other needs at this time.

## 2024-07-22 NOTE — Assessment & Plan Note (Signed)
 No acute issues suspected

## 2024-07-23 ENCOUNTER — Observation Stay

## 2024-07-23 DIAGNOSIS — N390 Urinary tract infection, site not specified: Secondary | ICD-10-CM | POA: Diagnosis not present

## 2024-07-23 LAB — CBC
HCT: 34.2 % — ABNORMAL LOW (ref 36.0–46.0)
Hemoglobin: 11.1 g/dL — ABNORMAL LOW (ref 12.0–15.0)
MCH: 31.5 pg (ref 26.0–34.0)
MCHC: 32.5 g/dL (ref 30.0–36.0)
MCV: 97.2 fL (ref 80.0–100.0)
Platelets: 134 K/uL — ABNORMAL LOW (ref 150–400)
RBC: 3.52 MIL/uL — ABNORMAL LOW (ref 3.87–5.11)
RDW: 12.5 % (ref 11.5–15.5)
WBC: 3.6 K/uL — ABNORMAL LOW (ref 4.0–10.5)
nRBC: 0 % (ref 0.0–0.2)

## 2024-07-23 LAB — CBG MONITORING, ED
Glucose-Capillary: 200 mg/dL — ABNORMAL HIGH (ref 70–99)
Glucose-Capillary: 186 mg/dL — ABNORMAL HIGH (ref 70–99)
Glucose-Capillary: 258 mg/dL — ABNORMAL HIGH (ref 70–99)

## 2024-07-23 LAB — C-REACTIVE PROTEIN: CRP: 0.6 mg/dL (ref <1.0)

## 2024-07-23 LAB — BASIC METABOLIC PANEL WITH GFR
Anion gap: 11 (ref 5–15)
BUN: 46 mg/dL — ABNORMAL HIGH (ref 8–23)
CO2: 21 mmol/L — ABNORMAL LOW (ref 22–32)
Calcium: 8.6 mg/dL — ABNORMAL LOW (ref 8.9–10.3)
Chloride: 106 mmol/L (ref 98–111)
Creatinine, Ser: 1.48 mg/dL — ABNORMAL HIGH (ref 0.44–1.00)
GFR, Estimated: 35 mL/min — ABNORMAL LOW (ref >60)
Glucose, Bld: 215 mg/dL — ABNORMAL HIGH (ref 70–99)
Potassium: 4.5 mmol/L (ref 3.5–5.1)
Sodium: 138 mmol/L (ref 135–145)

## 2024-07-23 LAB — GLUCOSE, CAPILLARY: Glucose-Capillary: 280 mg/dL — ABNORMAL HIGH (ref 70–99)

## 2024-07-23 MED ORDER — SERTRALINE HCL 50 MG PO TABS
100.0000 mg | ORAL_TABLET | Freq: Every morning | ORAL | Status: DC
  Administered 2024-07-23 – 2024-07-29 (×7): 100 mg via ORAL
  Filled 2024-07-23 (×7): qty 2

## 2024-07-23 MED ORDER — PANTOPRAZOLE SODIUM 40 MG PO TBEC
40.0000 mg | DELAYED_RELEASE_TABLET | Freq: Two times a day (BID) | ORAL | Status: DC
  Administered 2024-07-23 – 2024-07-29 (×13): 40 mg via ORAL
  Filled 2024-07-23 (×13): qty 1

## 2024-07-23 MED ORDER — ROPINIROLE HCL 1 MG PO TABS
0.5000 mg | ORAL_TABLET | Freq: Every day | ORAL | Status: DC
  Administered 2024-07-23 – 2024-07-28 (×6): 0.5 mg via ORAL
  Filled 2024-07-23 (×6): qty 1

## 2024-07-23 MED ORDER — GABAPENTIN 100 MG PO CAPS
100.0000 mg | ORAL_CAPSULE | Freq: Three times a day (TID) | ORAL | Status: DC
  Administered 2024-07-23 – 2024-07-29 (×18): 100 mg via ORAL
  Filled 2024-07-23 (×18): qty 1

## 2024-07-23 NOTE — Inpatient Diabetes Management (Signed)
 Inpatient Diabetes Program Recommendations  AACE/ADA: New Consensus Statement on Inpatient Glycemic Control   Target Ranges:  Prepandial:   less than 140 mg/dL      Peak postprandial:   less than 180 mg/dL (1-2 hours)      Critically ill patients:  140 - 180 mg/dL    Latest Reference Range & Units 07/22/24 16:01 07/22/24 17:01 07/22/24 23:09 07/23/24 04:47  Glucose-Capillary 70 - 99 mg/dL 46 (L) 884 (H) 749 (H) 200 (H)   Review of Glycemic Control  Diabetes history: DM2 Outpatient Diabetes medications: Lantus  17 units QAM Current orders for Inpatient glycemic control: Novolog  0-6 units TID with meals, Novolog  0-5 units QHS  Inpatient Diabetes Program Recommendations:    Insulin : Noted patient was initially hypoglycemic and CBG up to 250 mg/dl at 76:90 last night.  If CBGs are consistently over 180 mg/dl with Novolog  correction, may need to consider adding low dose basal (such as Semglee  7 units Q24H).  IV Fluids: CBG 200 mg/dl at 5:52 am today. Please re-evaluate if dextrose  is needed in IV fluids.   Thanks, Earnie Gainer, RN, MSN, CDCES Diabetes Coordinator Inpatient Diabetes Program [phone] (Team Pager from 8am to 5pm)

## 2024-07-23 NOTE — ED Notes (Signed)
 NURSE JENNIFER INFORMED OF READY BED

## 2024-07-23 NOTE — Progress Notes (Addendum)
 PROGRESS NOTE    Deborah Obrien  FMW:[id number] DOB: [birth year] DOA: 07/22/2024 PCP: Cordella Corning, FNP     Brief Narrative:   From admission h and p  Deborah Obrien is a 83 y.o. female with medical history significant for parox Afib/flutter on apixaban , SND s/p PPM, HFpEF, CAD, CKD lllb, T2DM, HTN, hypothyroid , anxiety, vascular dementia, history of hemorrhoidal Bleed 12/2023, being admitted with an MRSA urinary tract infection seen on repeat culture after she failed to improve with interrupted Cipro  outpatient treatment in the setting of a month of persistent dysuria, poor oral intake and generalized malaise.  She denies fever or or chills.  Denies abdominal pain or change in bowel habit.  Does endorse a mild cough without shortness of breath.  Patient has multiple antibiotic allergies (macrolides, sulfas, tetracycline) and was sent from her assisted living facility at [facility] house for inpatient management.     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 Patient reports several weeks urinary incontinence and dysuria. It appears she was treated with cipro  and bactrim beginning on 10/30. Her symptoms persist. Only one set of blood cultures was obtained. Patient reports ongoing dysuria, no fevers. Does have low WBCs and mild thrombocytopenia. Does have a pacemaker, spinal hardware, and orthopedic  hardware in hip and arm, so hematogenous spread from distant site to bladder needs to be considered. UA here not strongly suggestive of infection. 10/30 urine culture growing staph lugdunensis and mrsa (culture results are in the media tab (see photos from 11/11)). CT of abdomen pelvis (non-con) does not show infectious complications - f/u blood and urine culture - cont linezolid for now, if this is a uti need to consider this as bactrim failure - f/u crp, does have back pain though she says this is chronic, has spinal hardware there, non-con ct doesn't show signs infection but with mrsa need to consider hematogenous spread  # Debility Resides at [facility]  # CKD 3b Kidney function at baseline - monitor  # A-fib Rate controlled - home amio, apixaban   # Sick sinus syndrome Has pacemaker  # Chronic hypotension - cont home midodrine   # HFpEF Euvolemic - home lasix  on hold  # Chronic pain - home tramadol  prn, gabapentin   # T2DM Hypoglycemic yesterday, resolved - hold long-acting - SSI  # Hypothyroid - home synthroid   # restless leg - home requip   # 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   # Gout Asymptomatic - home allopurinol    DVT prophylaxis: lovenox  Code Status: dnr/dni Family Communication: none at bedside  Level of care: Med-Surg Status is: Inpatient Remains inpatient appropriate because: ongoing inpatient w/u    Consultants:  none  Procedures: None thus far  Antimicrobials:  linezolid    Subjective: Reports ongoing dysuria, stable chronic back pain  Objective: Vitals:   07/23/24 0430 07/23/24 0543 07/23/24 0752 07/23/24 0800  BP: (!) 115/57 (!) 115/57 129/68 121/66  Pulse: 60  60 75 64  Resp:  16 18   Temp:  97.9 F (36.6 C) 99.7 F (37.6 C)   TempSrc:  Oral Oral   SpO2: 98% 98% 99% 98%   No intake or output data in the 24 hours ending 07/23/24 0829  There were no vitals filed for this visit.   Examination:  General exam:  Appears calm and comfortable  Respiratory system: Clear to auscultation. Respiratory effort normal. Cardiovascular system: S1 & S2 heard, RRR.   Gastrointestinal system: Abdomen is nondistended, soft and mild tenderness suprapubic Central nervous system: Alert and oriented. No focal neurological deficits. Extremities: Symmetric 5 x 5 power. Skin: No rashes, lesions or ulcers. External genitalia examined with chaperone present, no ulcerations or other lesions Psychiatry: Judgement and insight appear normal. Mood & affect appropriate.     Data Reviewed: I have personally reviewed following labs and imaging studies  CBC: Recent Labs  Lab 07/22/24 1415 07/23/24 0644  WBC 5.4 3.6*  NEUTROABS 3.1  --   HGB 13.1 11.1*  HCT 40.6 34.2*  MCV 98.1 97.2  PLT 167 134*   Basic Metabolic Panel: Recent Labs  Lab 07/22/24 1415 07/23/24 0428  NA 140 138  K 4.2 4.5  CL 103 106  CO2 23 21*  GLUCOSE 136* 215*  BUN 55* 46*  CREATININE 1.88* 1.48*  CALCIUM  9.4 8.6*   GFR: CrCl cannot be calculated (Unknown ideal weight.). Liver Function Tests: Recent Labs  Lab 07/22/24 1415  AST 52*  ALT 36  ALKPHOS 127*  BILITOT 1.1  PROT 8.8*  ALBUMIN  4.5   No results for input(s): LIPASE, AMYLASE in the last 168 hours.  No results for input(s): AMMONIA in the last 168 hours. Coagulation Profile: No results for input(s): INR, PROTIME in the last 168 hours. Cardiac Enzymes: No results for input(s): CKTOTAL, CKMB, CKMBINDEX, TROPONINI in the last 168 hours. BNP (last 3 results) No results for input(s): PROBNP in the last 8760 hours. HbA1C: No results for input(s): HGBA1C in the last 72 hours.  CBG: Recent Labs  Lab 07/22/24 1601 07/22/24 1701 07/22/24 2309 07/23/24 0447 07/23/24 0806  GLUCAP 46* 115* 250* 200* 186*   Lipid Profile: No results for input(s): CHOL, HDL, LDLCALC, TRIG, CHOLHDL, LDLDIRECT in the last 72 hours. Thyroid  Function  Tests: No results for input(s): TSH, T4TOTAL, FREET4, T3FREE, THYROIDAB in the last 72 hours. Anemia Panel: No results for input(s): VITAMINB12, FOLATE, FERRITIN, TIBC, IRON , RETICCTPCT in the last 72 hours. Urine analysis:    Component Value Date/Time   COLORURINE YELLOW (A) 07/22/2024 1739   APPEARANCEUR CLEAR (A) 07/22/2024 1739   APPEARANCEUR Hazy 10/29/2012 1534   LABSPEC 1.013 07/22/2024 1739   LABSPEC 1.010 10/29/2012 1534   PHURINE 5.0 07/22/2024 1739   GLUCOSEU NEGATIVE 07/22/2024 1739   GLUCOSEU Negative 10/29/2012 1534   HGBUR NEGATIVE 07/22/2024 1739   BILIRUBINUR NEGATIVE 07/22/2024 1739   BILIRUBINUR Negative 10/29/2012 1534   KETONESUR NEGATIVE 07/22/2024 1739   PROTEINUR NEGATIVE 07/22/2024 1739   NITRITE NEGATIVE 07/22/2024 1739   LEUKOCYTESUR TRACE (A) 07/22/2024 1739   LEUKOCYTESUR Negative 10/29/2012 1534   Sepsis Labs: @LABRCNTIP (procalcitonin:4,lacticidven:4)  ) Recent Results (from the past 240 hours)  Resp panel by RT-PCR (RSV, Flu A&B, Covid) Anterior Nasal Swab     Status: None   Collection Time: 07/22/24  4:03 PM   Specimen: Anterior Nasal Swab  Result Value Ref Range Status   SARS Coronavirus 2 by RT PCR NEGATIVE NEGATIVE Final    Comment: (NOTE)  SARS-CoV-2 target nucleic acids are NOT DETECTED.  The SARS-CoV-2 RNA is generally detectable in upper respiratory specimens during the acute phase of infection. The lowest concentration of SARS-CoV-2 viral copies this assay can detect is 138 copies/mL. A negative result does not preclude SARS-Cov-2 infection and should not be used as the sole basis for treatment or other patient management decisions. A negative result may occur with  improper specimen collection/handling, submission of specimen other than nasopharyngeal swab, presence of viral mutation(s) within the areas targeted by this assay, and inadequate number of viral copies(<138 copies/mL). A negative result must be  combined with clinical observations, patient history, and epidemiological information. The expected result is Negative.  Fact Sheet for Patients:  bloggercourse.com  Fact Sheet for Healthcare Providers:  seriousbroker.it  This test is no t yet approved or cleared by the [place]  FDA and  has been authorized for detection and/or diagnosis of SARS-CoV-2 by FDA under an Emergency Use Authorization (EUA). This EUA will remain  in effect (meaning this test can be used) for the duration of the COVID-19 declaration under Section 564(b)(1) of the Act, 21 U.S.C.section 360bbb-3(b)(1), unless the authorization is terminated  or revoked sooner.       Influenza A by PCR NEGATIVE NEGATIVE Final   Influenza B by PCR NEGATIVE NEGATIVE Final    Comment: (NOTE) The Xpert Xpress SARS-CoV-2/FLU/RSV plus assay is intended as an aid in the diagnosis of influenza from Nasopharyngeal swab specimens and should not be used as a sole basis for treatment. Nasal washings and aspirates are unacceptable for Xpert Xpress SARS-CoV-2/FLU/RSV testing.  Fact Sheet for Patients: bloggercourse.com  Fact Sheet for Healthcare Providers: seriousbroker.it  This test is not yet approved or cleared by the [place]  FDA and has been authorized for detection and/or diagnosis of SARS-CoV-2 by FDA under an Emergency Use Authorization (EUA). This EUA will remain in effect (meaning this test can be used) for the duration of the COVID-19 declaration under Section 564(b)(1) of the Act, 21 U.S.C. section 360bbb-3(b)(1), unless the authorization is terminated or revoked.     Resp Syncytial Virus by PCR NEGATIVE NEGATIVE Final    Comment: (NOTE) Fact Sheet for Patients: bloggercourse.com  Fact Sheet for Healthcare Providers: seriousbroker.it  This test is not yet  approved or cleared by the [place]  FDA and has been authorized for detection and/or diagnosis of SARS-CoV-2 by FDA under an Emergency Use Authorization (EUA). This EUA will remain in effect (meaning this test can be used) for the duration of the COVID-19 declaration under Section 564(b)(1) of the Act, 21 U.S.C. section 360bbb-3(b)(1), unless the authorization is terminated or revoked.  Performed at [facility], [address] [place], [place] [address]   Blood culture (single)     Status: None (Preliminary result)   Collection Time: 07/22/24  4:53 PM   Specimen: BLOOD  Result Value Ref Range Status   Specimen Description BLOOD BLOOD LEFT FOREARM  Final   Special Requests   Final    BOTTLES DRAWN AEROBIC AND ANAEROBIC Blood Culture results may not be optimal due to an inadequate volume of blood received in culture bottles   Culture   Final    NO GROWTH < 24 HOURS Performed at [facility], [address] [place], [place] [address]    Report Status PENDING  Incomplete         Radiology Studies: DG Chest 2 View Result Date: 07/22/2024 EXAM: 2 VIEW(S) XRAY OF THE CHEST 07/22/2024 04:15:31 PM  COMPARISON: Chest x-ray 10/28/2023 CLINICAL HISTORY: cough FINDINGS: LUNGS AND PLEURA: Small branching calcifications are seen in the bilateral upper lobes, similar to prior study. No pulmonary edema. No pleural effusion. No pneumothorax. HEART AND MEDIASTINUM: Left-sided pacemaker is again seen. No acute abnormality of the cardiac and mediastinal silhouettes. BONES AND SOFT TISSUES: There are healed right-sided rib fractures. Cervical spinal fusion plate is present. Lower thoracic vertebroplasty changes are again noted. IMPRESSION: 1. No acute findings. Electronically signed by: Greig Pique MD 07/22/2024 04:35 PM EST RP Workstation: [id number]        Scheduled Meds:  amiodarone   100 mg Oral Daily   apixaban   2.5 mg Oral BID   insulin  aspart  0-5 Units  Subcutaneous QHS   insulin  aspart  0-6 Units Subcutaneous TID WC   midodrine   5 mg Oral TID WC   Continuous Infusions:  dextrose  5 % and 0.9 % NaCl Stopped (07/22/24 2346)   linezolid (ZYVOX) IV        LOS: 0 days     Devaughn KATHEE Ban, MD [facility]   If 7PM-7AM, please contact night-coverage [email or web address] Password [email or web address] 07/23/2024, 8:29 AM

## 2024-07-23 NOTE — ED Notes (Signed)
 Pt called out and stated that she felt miserable.  She states she is having flank pain,burning and her restless leg syndrom is keeping her awake.   The pt was asked if she needed pain medication and something to assit her in with sleep and she stated that she needed anything that would help.

## 2024-07-23 NOTE — Plan of Care (Signed)
  Problem: Education: Goal: Ability to describe self-care measures that may prevent or decrease complications (Diabetes Survival Skills Education) will improve Outcome: Progressing   Problem: Health Behavior/Discharge Planning: Goal: Ability to identify and utilize available resources and services will improve Outcome: Progressing   Problem: Nutritional: Goal: Maintenance of adequate nutrition will improve Outcome: Progressing   Problem: Skin Integrity: Goal: Risk for impaired skin integrity will decrease Outcome: Progressing

## 2024-07-24 DIAGNOSIS — E11649 Type 2 diabetes mellitus with hypoglycemia without coma: Secondary | ICD-10-CM

## 2024-07-24 DIAGNOSIS — E119 Type 2 diabetes mellitus without complications: Secondary | ICD-10-CM | POA: Diagnosis not present

## 2024-07-24 DIAGNOSIS — K760 Fatty (change of) liver, not elsewhere classified: Secondary | ICD-10-CM | POA: Diagnosis present

## 2024-07-24 DIAGNOSIS — Z1152 Encounter for screening for COVID-19: Secondary | ICD-10-CM | POA: Diagnosis not present

## 2024-07-24 DIAGNOSIS — I5032 Chronic diastolic (congestive) heart failure: Secondary | ICD-10-CM | POA: Diagnosis present

## 2024-07-24 DIAGNOSIS — Z794 Long term (current) use of insulin: Secondary | ICD-10-CM | POA: Diagnosis not present

## 2024-07-24 DIAGNOSIS — I700 Atherosclerosis of aorta: Secondary | ICD-10-CM | POA: Diagnosis present

## 2024-07-24 DIAGNOSIS — N390 Urinary tract infection, site not specified: Secondary | ICD-10-CM | POA: Diagnosis present

## 2024-07-24 DIAGNOSIS — Z7901 Long term (current) use of anticoagulants: Secondary | ICD-10-CM | POA: Diagnosis not present

## 2024-07-24 DIAGNOSIS — D696 Thrombocytopenia, unspecified: Secondary | ICD-10-CM | POA: Diagnosis present

## 2024-07-24 DIAGNOSIS — E1122 Type 2 diabetes mellitus with diabetic chronic kidney disease: Secondary | ICD-10-CM | POA: Diagnosis present

## 2024-07-24 DIAGNOSIS — N3000 Acute cystitis without hematuria: Secondary | ICD-10-CM | POA: Diagnosis present

## 2024-07-24 DIAGNOSIS — E1165 Type 2 diabetes mellitus with hyperglycemia: Secondary | ICD-10-CM | POA: Diagnosis present

## 2024-07-24 DIAGNOSIS — E039 Hypothyroidism, unspecified: Secondary | ICD-10-CM | POA: Diagnosis present

## 2024-07-24 DIAGNOSIS — R300 Dysuria: Secondary | ICD-10-CM | POA: Diagnosis present

## 2024-07-24 DIAGNOSIS — I495 Sick sinus syndrome: Secondary | ICD-10-CM | POA: Diagnosis present

## 2024-07-24 DIAGNOSIS — I130 Hypertensive heart and chronic kidney disease with heart failure and stage 1 through stage 4 chronic kidney disease, or unspecified chronic kidney disease: Secondary | ICD-10-CM | POA: Diagnosis present

## 2024-07-24 DIAGNOSIS — I480 Paroxysmal atrial fibrillation: Secondary | ICD-10-CM | POA: Diagnosis present

## 2024-07-24 DIAGNOSIS — N1832 Chronic kidney disease, stage 3b: Secondary | ICD-10-CM | POA: Diagnosis present

## 2024-07-24 DIAGNOSIS — F329 Major depressive disorder, single episode, unspecified: Secondary | ICD-10-CM | POA: Diagnosis present

## 2024-07-24 DIAGNOSIS — F01B3 Vascular dementia, moderate, with mood disturbance: Secondary | ICD-10-CM | POA: Diagnosis present

## 2024-07-24 DIAGNOSIS — G2581 Restless legs syndrome: Secondary | ICD-10-CM | POA: Diagnosis present

## 2024-07-24 DIAGNOSIS — B9562 Methicillin resistant Staphylococcus aureus infection as the cause of diseases classified elsewhere: Secondary | ICD-10-CM | POA: Diagnosis present

## 2024-07-24 DIAGNOSIS — Z66 Do not resuscitate: Secondary | ICD-10-CM | POA: Diagnosis present

## 2024-07-24 DIAGNOSIS — E872 Acidosis, unspecified: Secondary | ICD-10-CM | POA: Diagnosis present

## 2024-07-24 DIAGNOSIS — N3946 Mixed incontinence: Secondary | ICD-10-CM | POA: Diagnosis not present

## 2024-07-24 DIAGNOSIS — F01B4 Vascular dementia, moderate, with anxiety: Secondary | ICD-10-CM | POA: Diagnosis present

## 2024-07-24 DIAGNOSIS — B957 Other staphylococcus as the cause of diseases classified elsewhere: Secondary | ICD-10-CM | POA: Diagnosis present

## 2024-07-24 LAB — GLUCOSE, CAPILLARY
Glucose-Capillary: 269 mg/dL — ABNORMAL HIGH (ref 70–99)
Glucose-Capillary: 477 mg/dL — ABNORMAL HIGH (ref 70–99)
Glucose-Capillary: 546 mg/dL (ref 70–99)
Glucose-Capillary: 349 mg/dL — ABNORMAL HIGH (ref 70–99)
Glucose-Capillary: 184 mg/dL — ABNORMAL HIGH (ref 70–99)
Glucose-Capillary: 279 mg/dL — ABNORMAL HIGH (ref 70–99)

## 2024-07-24 LAB — MRSA NEXT GEN BY PCR, NASAL: MRSA by PCR Next Gen: DETECTED — AB

## 2024-07-24 MED ORDER — MUPIROCIN 2 % EX OINT
1.0000 | TOPICAL_OINTMENT | Freq: Two times a day (BID) | CUTANEOUS | Status: AC
  Administered 2024-07-24 – 2024-07-29 (×10): 1 via NASAL
  Filled 2024-07-24: qty 22

## 2024-07-24 MED ORDER — CHLORHEXIDINE GLUCONATE CLOTH 2 % EX PADS
6.0000 | MEDICATED_PAD | Freq: Every day | CUTANEOUS | Status: AC
  Administered 2024-07-24 – 2024-07-26 (×2): 6 via TOPICAL

## 2024-07-24 MED ORDER — INSULIN GLARGINE-YFGN 100 UNIT/ML ~~LOC~~ SOLN
20.0000 [IU] | Freq: Every day | SUBCUTANEOUS | Status: DC
  Administered 2024-07-24 – 2024-07-29 (×6): 20 [IU] via SUBCUTANEOUS
  Filled 2024-07-24 (×6): qty 0.2

## 2024-07-24 MED ORDER — OXYBUTYNIN CHLORIDE 5 MG PO TABS
2.5000 mg | ORAL_TABLET | Freq: Three times a day (TID) | ORAL | Status: DC
  Administered 2024-07-24 – 2024-07-29 (×15): 2.5 mg via ORAL
  Filled 2024-07-24 (×16): qty 0.5

## 2024-07-24 MED ORDER — GLUCERNA SHAKE PO LIQD
237.0000 mL | Freq: Three times a day (TID) | ORAL | Status: DC
  Administered 2024-07-24 – 2024-07-29 (×10): 237 mL via ORAL

## 2024-07-24 MED ORDER — INSULIN ASPART 100 UNIT/ML IJ SOLN
12.0000 [IU] | Freq: Once | INTRAMUSCULAR | Status: AC
  Administered 2024-07-24: 12 [IU] via SUBCUTANEOUS
  Filled 2024-07-24: qty 12

## 2024-07-24 NOTE — Hospital Course (Signed)
 Deborah Obrien is a 83 y.o. female with medical history significant for parox Afib/flutter on apixaban , SND s/p PPM, HFpEF, CAD, CKD lllb, T2DM, HTN, hypothyroid , anxiety, vascular dementia, history of hemorrhoidal Bleed 12/2023, being admitted with an MRSA urinary tract infection seen on repeat culture after she failed to improve with interrupted Cipro  outpatient treatment in the setting of a month of persistent dysuria, poor oral intake and generalized malaise

## 2024-07-24 NOTE — Plan of Care (Signed)
   Problem: Education: Goal: Ability to describe self-care measures that may prevent or decrease complications (Diabetes Survival Skills Education) will improve Outcome: Progressing

## 2024-07-24 NOTE — Progress Notes (Addendum)
 Progress Note   Patient: Deborah Obrien FMW:[id number] DOB: 1940-09-18 DOA: 07/22/2024     0 DOS: the patient was seen and examined on 07/24/2024   Brief hospital course: TARRA PENCE is a 83 y.o. female with medical history significant for parox Afib/flutter on apixaban , SND s/p PPM, HFpEF, CAD, CKD lllb, T2DM, HTN, hypothyroid , anxiety, vascular dementia, history of hemorrhoidal Bleed 12/2023, being admitted with an MRSA urinary tract infection seen on repeat culture after she failed to improve with interrupted Cipro  outpatient treatment in the setting of a month of persistent dysuria, poor oral intake and generalized malaise   Principal Problem:   Complicated urinary tract infection, MRSA Active Problems:   Acute renal failure superimposed on stage 3b chronic kidney disease (HCC)   Hypoglycemia, with long-term current use of insulin  (HCC)   Hypothyroidism   (HFpEF) heart failure with preserved ejection fraction (HCC)   Paroxysmal atrial fibrillation (HCC)   CAD (coronary artery disease)   Restless leg syndrome   Mixed urge and stress incontinence   Chronic anticoagulation   Sinus node dysfunction s/p permanent cardiac pacemaker   H/O TIA (transient ischemic attack) and stroke   Inadequate oral intake   Uncontrolled type 2 diabetes mellitus with hyperglycemia, with long-term current use of insulin  (HCC)   Assessment and Plan: * Complicated urinary tract infection, MRSA Multiple antibiotic allergies Mixed stress and urge incontinence Patient with a months of dysuria, decreased oral intake and malaise, failing Cipro  x 2 Patient was started on Zyvox after admission to the hospital, repeat urine culture has a low counts of Staph aureus, final culture result still pending. Blood culture negative.  Will continue current regimen. Patient also has urge and stress incontinence, we will try Ditropan .   stage 3b chronic kidney disease (HCC) Acute kidney injury ruled out. Mild  metabolic acidosis. Reviewed patient records, patient does not have acute kidney injury.  Renal function still relatively stable.  Minimal metabolic acidosis.  Uncontrolled type 2 diabetes with hyperglycemia. Hypoglycemia. Patient initially had hypoglycemia of 46, she was only placed on sliding scale insulin .  Glucose now running about 500.  Restart home dose insulin  glargine, also give a dose NovoLog  to 12 units.  Continue to follow glucose.  Paroxysmal atrial fibrillation (HCC) History of TIA Continue anticoagulation.  (HFpEF) heart failure with preserved ejection fraction (HCC) Chronic hypotension on midodrine  No evidence of exacerbation  Hypothyroidism Continue levothyroxine   CAD (coronary artery disease) No complaints of chest pain and EKG is nonacute  Sinus node dysfunction s/p permanent cardiac pacemaker No acute issues suspected  Thrombocytopenia. Mild, continue to follow.     Subjective:  Patient doing well today, still has significant dysuria.  Physical Exam: Vitals:   07/23/24 2038 07/24/24 0448 07/24/24 0757 07/24/24 1153  BP: (!) 107/52 (!) 118/47 (!) 135/53 (!) 113/44  Pulse: 62 61 71 68  Resp: 18 18 20 19   Temp: 98.8 F (37.1 C) 97.6 F (36.4 C) 98 F (36.7 C) 98.3 F (36.8 C)  TempSrc: Oral Oral Oral Oral  SpO2: 100% 98% 100% 100%  Weight:       General exam: Appears calm and comfortable  Respiratory system: Clear to auscultation. Respiratory effort normal. Cardiovascular system: S1 & S2 heard, RRR. No JVD, murmurs, rubs, gallops or clicks. No pedal edema. Gastrointestinal system: Abdomen is nondistended, soft and nontender. No organomegaly or masses felt. Normal bowel sounds heard. Central nervous system: Alert and oriented. No focal neurological deficits. Extremities: Symmetric 5 x 5 power. Skin:  No rashes, lesions or ulcers Psychiatry: Judgement and insight appear normal. Mood & affect appropriate.    Data Reviewed:  Lab results  reviewed.  Family Communication: Son updated over the phone.  Disposition: Status is: Observation The patient will require care spanning > 2 midnights and should be moved to inpatient because: Severity of disease, IV treatment     Time spent: 54 minutes  Author: Murvin Mana, MD 07/24/2024 2:00 PM  For on call review [email or web address].

## 2024-07-24 NOTE — Care Management Obs Status (Signed)
 MEDICARE OBSERVATION STATUS NOTIFICATION   Patient Details  Name: Deborah Obrien MRN: [id number] Date of Birth: May 15, 1941   Medicare Observation Status Notification Given:  Chaney BRANDY CHRISTIANE LELON, CMA 07/24/2024, 12:03 PM

## 2024-07-25 DIAGNOSIS — N3946 Mixed incontinence: Secondary | ICD-10-CM

## 2024-07-25 DIAGNOSIS — E11649 Type 2 diabetes mellitus with hypoglycemia without coma: Secondary | ICD-10-CM | POA: Diagnosis not present

## 2024-07-25 DIAGNOSIS — N390 Urinary tract infection, site not specified: Secondary | ICD-10-CM | POA: Diagnosis not present

## 2024-07-25 DIAGNOSIS — Z794 Long term (current) use of insulin: Secondary | ICD-10-CM | POA: Diagnosis not present

## 2024-07-25 LAB — GLUCOSE, CAPILLARY
Glucose-Capillary: 189 mg/dL — ABNORMAL HIGH (ref 70–99)
Glucose-Capillary: 302 mg/dL — ABNORMAL HIGH (ref 70–99)
Glucose-Capillary: 296 mg/dL — ABNORMAL HIGH (ref 70–99)
Glucose-Capillary: 82 mg/dL (ref 70–99)

## 2024-07-25 LAB — URINE CULTURE: Culture: 80000 — AB

## 2024-07-25 LAB — CBC
HCT: 36.9 % (ref 36.0–46.0)
Hemoglobin: 12.2 g/dL (ref 12.0–15.0)
MCH: 32.1 pg (ref 26.0–34.0)
MCHC: 33.1 g/dL (ref 30.0–36.0)
MCV: 97.1 fL (ref 80.0–100.0)
Platelets: 173 K/uL (ref 150–400)
RBC: 3.8 MIL/uL — ABNORMAL LOW (ref 3.87–5.11)
RDW: 12.2 % (ref 11.5–15.5)
WBC: 4.4 K/uL (ref 4.0–10.5)
nRBC: 0 % (ref 0.0–0.2)

## 2024-07-25 LAB — BASIC METABOLIC PANEL WITH GFR
Anion gap: 9 (ref 5–15)
BUN: 38 mg/dL — ABNORMAL HIGH (ref 8–23)
CO2: 26 mmol/L (ref 22–32)
Calcium: 9.1 mg/dL (ref 8.9–10.3)
Chloride: 101 mmol/L (ref 98–111)
Creatinine, Ser: 1.36 mg/dL — ABNORMAL HIGH (ref 0.44–1.00)
GFR, Estimated: 38 mL/min — ABNORMAL LOW (ref >60)
Glucose, Bld: 174 mg/dL — ABNORMAL HIGH (ref 70–99)
Potassium: 5.5 mmol/L — ABNORMAL HIGH (ref 3.5–5.1)
Sodium: 136 mmol/L (ref 135–145)

## 2024-07-25 LAB — MAGNESIUM: Magnesium: 1.8 mg/dL (ref 1.7–2.4)

## 2024-07-25 MED ORDER — INSULIN ASPART 100 UNIT/ML IJ SOLN
4.0000 [IU] | Freq: Three times a day (TID) | INTRAMUSCULAR | Status: DC
  Administered 2024-07-25 – 2024-07-26 (×3): 4 [IU] via SUBCUTANEOUS
  Filled 2024-07-25 (×3): qty 4

## 2024-07-25 MED ORDER — LINEZOLID 600 MG PO TABS
600.0000 mg | ORAL_TABLET | Freq: Two times a day (BID) | ORAL | Status: DC
  Administered 2024-07-25 – 2024-07-26 (×3): 600 mg via ORAL
  Filled 2024-07-25 (×3): qty 1

## 2024-07-25 NOTE — Progress Notes (Signed)
 PHARMACIST - PHYSICIAN COMMUNICATION DR:   Laurita CONCERNING: Antibiotic IV to Oral Route Change Policy  RECOMMENDATION: This patient is receiving linezolid by the intravenous route.  Based on criteria approved by the Pharmacy and Therapeutics Committee, the antibiotic(s) is/are being converted to the equivalent oral dose form(s).   DESCRIPTION: These criteria include: Patient being treated for a respiratory tract infection, urinary tract infection, cellulitis or clostridium difficile associated diarrhea if on metronidazole The patient is not neutropenic and does not exhibit a GI malabsorption state The patient is eating (either orally or via tube) and/or has been taking other orally administered medications for a least 24 hours The patient is improving clinically and has a Tmax < 100.5  If you have questions about this conversion, please contact the Pharmacy Department  []   [phone] )  [facility] []   [phone] )  [facility] []   [phone] )  [facility] []   [phone] )  [facility] []   [phone] )  [facility]    Celestine Slovak, PharmD, [place], [place] Work Cell: [phone] 07/25/2024 9:41 AM

## 2024-07-25 NOTE — TOC Initial Note (Signed)
 Transition of Care [facility]) - Initial/Assessment Note    Patient Details  Name: Deborah Obrien MRN: [id number] Date of Birth: 1941-04-04  Transition of Care [facility]) CM/SW Contact:    Alvaro Louder, LCSW Phone Number: 07/25/2024, 3:58 PM  Clinical Narrative:   Patient from [facility], PCP is Wilbert Hacker. Patient will return to [facility] tomorrow.                        Patient Goals and CMS Choice            Expected Discharge Plan and Services                                              Prior Living Arrangements/Services                       Activities of Daily Living   ADL Screening (condition at time of admission) Independently performs ADLs?: Yes (appropriate for developmental age) Is the patient deaf or have difficulty hearing?: No Does the patient have difficulty seeing, even when wearing glasses/contacts?: No Does the patient have difficulty concentrating, remembering, or making decisions?: No  Permission Sought/Granted                  Emotional Assessment              Admission diagnosis:  Complicated urinary tract infection [N39.0] Acute cystitis without hematuria [N30.00] AKI (acute kidney injury) [N17.9] Complicated UTI (urinary tract infection) [N39.0] Patient Active Problem List   Diagnosis Date Noted   Uncontrolled type 2 diabetes mellitus with hyperglycemia, with long-term current use of insulin  (HCC) 07/24/2024   Complicated UTI (urinary tract infection) 07/24/2024   Complicated urinary tract infection, MRSA 07/22/2024   Inadequate oral intake 07/22/2024   Gastritis with hemorrhage 05/29/2024   CKD stage 3b, GFR 30-44 ml/min (HCC) 05/26/2024   Intractable nausea and vomiting 05/25/2024   Elevated LFTs 05/25/2024   Depression 05/25/2024   Type 2 diabetes mellitus without complications (HCC) 05/25/2024   Polyp of transverse colon 01/10/2024   Blood in stool 01/09/2024   BRBPR (bright red blood  per rectum) 01/08/2024   Gastroenteritis 01/08/2024   Hemorrhagic shock (HCC) 10/31/2023   RSV (respiratory syncytial virus pneumonia) 10/31/2023   Hyperkalemia 10/31/2023   Hyponatremia 10/31/2023   Hematoma of right hip 10/28/2023   Acute respiratory failure with hypoxia (HCC) 02/01/2023   Paroxysmal atrial fibrillation (HCC) 01/24/2023   Pulmonary hypertension (HCC) 01/22/2023   Metabolic acidosis 01/22/2023   Fall 01/22/2023   Transaminitis 01/21/2023   Hypotension after procedure 01/21/2023   Shock (HCC) 01/21/2023   Closed fracture of right hip (HCC) 01/21/2023   Nausea 01/21/2023   (HFpEF) heart failure with preserved ejection fraction (HCC) 01/01/2023   Restless leg syndrome 05/31/2022   Hypothyroidism 05/30/2022   Acute renal failure superimposed on stage 3b chronic kidney disease (HCC) 05/30/2022   Drop in hemoglobin 05/30/2022   Elevated liver function tests 05/30/2022   History of gout 05/30/2022   Humerus fracture 05/29/2022   Abnormal urinalysis 03/05/2022   Hypoglycemia, with long-term current use of insulin  (HCC) 03/04/2022   Hypoglycemia 03/04/2022   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/02/2022   Anemia in chronic kidney disease 03/02/2022   Congestive heart failure (HCC) 03/02/2022  Osteoporosis 02/13/2020   Primary osteoarthritis of left shoulder 11/10/2019   Uses walker 09/27/2018   UTI (urinary tract infection) 06/14/2018   Type 2 diabetes mellitus with renal manifestations (HCC) 05/02/2018   Chronic anticoagulation 04/09/2018   Sinus node dysfunction s/p permanent cardiac pacemaker 04/09/2018   H/O TIA (transient ischemic attack) and stroke 04/09/2018   CAD (coronary artery disease) 04/09/2018   Hypotension 02/04/2018   Sinus node dysfunction (HCC) 08/24/2017   Dizziness 06/12/2017   Nephrolithiasis 02/22/2016   Diabetic neuropathy with neurologic complication (HCC) 11/30/2015   Recurrent major depressive  disorder, in remission 08/11/2015   Polyp of stomach and duodenum 07/29/2015   Gastric polyp    Gastro-esophageal reflux disease without esophagitis    Epigastric pain    Permanent atrial fibrillation (HCC) 06/29/2015   Intestinal obstruction (HCC) 02/20/2015   DDD (degenerative disc disease), lumbar 01/19/2015   Back pain 01/12/2015   Gout 01/12/2015   Intervertebral disc disorder with radiculopathy of lumbar region 09/17/2014   Lumbar radiculitis 09/17/2014   Lumbar stenosis with neurogenic claudication 09/17/2014   Neuritis or radiculitis due to rupture of lumbar intervertebral disc 09/17/2014   Atherosclerosis of both carotid arteries 07/15/2014   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   Bilateral carotid artery disease 07/15/2014   Body mass index (BMI) of 40.0-44.9 in adult [facility]) 06/21/2014   Mixed urge and stress incontinence 06/25/2013   Nocturia 06/25/2013   Blurring of visual image 04/11/2012   Other visual disturbances 04/11/2012   PCP:  Cordella Corning, FNP Pharmacy:   [facility] - [place], [place] - [address] [place] [address] Phone: [phone] Fax: [phone]     Social Drivers of Health (SDOH) Social History: SDOH Screenings   Food Insecurity: No Food Insecurity (07/23/2024)  Housing: Unknown (07/23/2024)  Transportation Needs: No Transportation Needs (07/23/2024)  Utilities: Not At Risk (07/23/2024)  Financial Resource Strain: Low Risk  (05/23/2024)   Received from [facility]  Physical Activity: Unknown (02/19/2019)   [facility] from [facility]  Social Connections: Socially Integrated (07/23/2024)  Stress: Stress Concern Present (02/19/2019)   Received from [facility]  Tobacco Use: Low Risk  (07/22/2024)   SDOH Interventions:     Readmission Risk Interventions    05/27/2024   10:43 AM 01/10/2024    9:49 AM 10/30/2023    4:25 PM   Readmission Risk Prevention Plan  Transportation Screening Complete  Complete  Medication Review (RN Care Manager)   Complete  PCP or Specialist appointment within 3-5 days of discharge Complete Complete Complete  HRI or Home Care Consult Complete Complete Complete  SW Recovery Care/Counseling Consult Complete Complete   Palliative Care Screening Not Applicable Not Applicable Complete  Skilled Nursing Facility Not Applicable Not Applicable Complete

## 2024-07-25 NOTE — Inpatient Diabetes Management (Signed)
 Inpatient Diabetes Program Recommendations  AACE/ADA: New Consensus Statement on Inpatient Glycemic Control (2015)  Target Ranges:  Prepandial:   less than 140 mg/dL      Peak postprandial:   less than 180 mg/dL (1-2 hours)      Critically ill patients:  140 - 180 mg/dL   Lab Results  Component Value Date   GLUCAP 189 (H) 07/25/2024   HGBA1C 8.0 (H) 05/26/2024    Review of Glycemic Control  Latest Reference Range & Units 07/24/24 11:48 07/24/24 11:52 07/24/24 13:54 07/24/24 16:30 07/24/24 20:39 07/25/24 08:22  Glucose-Capillary 70 - 99 mg/dL 453 (HH) 522 (H) 650 (H) 184 (H) 279 (H) 189 (H)   Diabetes history: DM 2 Outpatient Diabetes medications:  Lantus  17 units daily, Novolin R 1-10 units tid with meals  Current orders for Inpatient glycemic control:  Semglee  20 units daily Novolog  0-6 units tid with meals and HS  Inpatient Diabetes Program Recommendations:    May consider adding low dose Novolog   meal coverage 3 units tid with meals (hold if patient eats less than 50% or NPO).   Thanks,  Randall Bullocks, RN, BC-ADM Inpatient Diabetes Coordinator Pager [phone]  (8a-5p)

## 2024-07-25 NOTE — Plan of Care (Signed)

## 2024-07-25 NOTE — Progress Notes (Signed)
 Progress Note   Patient: Deborah Obrien FMW:[id number] DOB: Aug 18, 1941 DOA: 07/22/2024     1 DOS: the patient was seen and examined on 07/25/2024   Brief hospital course: QUENISHA LOVINS is a 83 y.o. female with medical history significant for parox Afib/flutter on apixaban , SND s/p PPM, HFpEF, CAD, CKD lllb, T2DM, HTN, hypothyroid , anxiety, vascular dementia, history of hemorrhoidal Bleed 12/2023, being admitted with an MRSA urinary tract infection seen on repeat culture after she failed to improve with interrupted Cipro  outpatient treatment in the setting of a month of persistent dysuria, poor oral intake and generalized malaise   Principal Problem:   Complicated urinary tract infection, MRSA Active Problems:   Acute renal failure superimposed on stage 3b chronic kidney disease (HCC)   Hypoglycemia, with long-term current use of insulin  (HCC)   Hypothyroidism   (HFpEF) heart failure with preserved ejection fraction (HCC)   Paroxysmal atrial fibrillation (HCC)   CAD (coronary artery disease)   Restless leg syndrome   Mixed urge and stress incontinence   Chronic anticoagulation   Sinus node dysfunction s/p permanent cardiac pacemaker   H/O TIA (transient ischemic attack) and stroke   Inadequate oral intake   Uncontrolled type 2 diabetes mellitus with hyperglycemia, with long-term current use of insulin  (HCC)   Complicated UTI (urinary tract infection)   Assessment and Plan: * Complicated urinary tract infection, MRSA Multiple antibiotic allergies Mixed stress and urge incontinence Patient with a months of dysuria, decreased oral intake and malaise, failing Cipro  x 2 Patient was started on Zyvox after admission to the hospital, repeat urine culture has a low counts of Staph aureus, final culture result still pending. Blood culture negative.  Will continue current regimen. Patient also has urge and stress incontinence, placed on Ditropan . Patient condition much improved  today, medically stable for discharge.  However, multiple attempts to reach to [facility] without response.  Discussed with TOC, not able to discharge. Repeat urine culture grew MRSA.     stage 3b chronic kidney disease (HCC) Acute kidney injury ruled out. Mild metabolic acidosis. Reviewed patient records, patient does not have acute kidney injury.  Renal function still relatively stable.  Minimal metabolic acidosis.   Uncontrolled type 2 diabetes with hyperglycemia. Hypoglycemia. Patient initially had hypoglycemia of 46, she was only placed on sliding scale insulin .  Glucose now running about 500.  Restart home dose insulin  glargine, also give a dose NovoLog  to 12 units.  Glucose still running high today, added scheduled NovoLog .   Paroxysmal atrial fibrillation (HCC) History of TIA Continue anticoagulation.   (HFpEF) heart failure with preserved ejection fraction (HCC) Chronic hypotension on midodrine  No evidence of exacerbation   Hypothyroidism Continue levothyroxine    CAD (coronary artery disease) No complaints of chest pain and EKG is nonacute   Sinus node dysfunction s/p permanent cardiac pacemaker No acute issues suspected   Thrombocytopenia. Mild, continue to follow.       Subjective:  Patient doing well, urine symptom improved  Physical Exam: Vitals:   07/24/24 2039 07/25/24 0500 07/25/24 0840 07/25/24 1455  BP: (!) 130/52 (!) 123/57 106/62 (!) 104/48  Pulse: 60 63 62 66  Resp: 18 17 16 20   Temp: 98.2 F (36.8 C) 98.2 F (36.8 C) 98.6 F (37 C) 98 F (36.7 C)  TempSrc: Oral Oral Oral   SpO2: 98% 97% 99% 99%  Weight:       General exam: Appears calm and comfortable  Respiratory system: Clear to auscultation. Respiratory effort normal.  Cardiovascular system: S1 & S2 heard, RRR. No JVD, murmurs, rubs, gallops or clicks. No pedal edema. Gastrointestinal system: Abdomen is nondistended, soft and nontender. No organomegaly or masses felt. Normal bowel  sounds heard. Central nervous system: Alert and oriented. No focal neurological deficits. Extremities: Symmetric 5 x 5 power. Skin: No rashes, lesions or ulcers Psychiatry: Judgement and insight appear normal. Mood & affect appropriate.    Data Reviewed:  Lab results reviewed.  Family Communication: None  Disposition: Status is: Inpatient Remains inpatient appropriate because: Unsafe discharge, not able to reach [place].     Time spent: 35 minutes  Author: Murvin Mana, MD 07/25/2024 3:37 PM  For on call review [email or web address].

## 2024-07-25 NOTE — Progress Notes (Signed)
 Mobility Specialist - Progress Note   07/25/24 1441  Mobility  Activity Ambulated with assistance  Level of Assistance Standby assist, set-up cues, supervision of patient - no hands on  Assistive Device  (rollator)  Distance Ambulated (ft) 160 ft  Activity Response Tolerated well  Mobility visit 1 Mobility  Mobility Specialist Start Time (ACUTE ONLY) 1435  Mobility Specialist Stop Time (ACUTE ONLY) 1440  Mobility Specialist Time Calculation (min) (ACUTE ONLY) 5 min   Pt semi fowler upon entry, utilizing RA. Pt amb one lap around the NS, tolerated well. Pt returned to the room, left semi fowler with alarm set and needs within reach.  America Silvan Mobility Specialist 07/25/24 2:42 PM

## 2024-07-26 ENCOUNTER — Other Ambulatory Visit: Payer: Self-pay

## 2024-07-26 DIAGNOSIS — N390 Urinary tract infection, site not specified: Secondary | ICD-10-CM | POA: Diagnosis not present

## 2024-07-26 DIAGNOSIS — E11649 Type 2 diabetes mellitus with hypoglycemia without coma: Secondary | ICD-10-CM | POA: Diagnosis not present

## 2024-07-26 DIAGNOSIS — Z794 Long term (current) use of insulin: Secondary | ICD-10-CM | POA: Diagnosis not present

## 2024-07-26 DIAGNOSIS — I480 Paroxysmal atrial fibrillation: Secondary | ICD-10-CM | POA: Diagnosis not present

## 2024-07-26 LAB — BASIC METABOLIC PANEL WITH GFR
Anion gap: 8 (ref 5–15)
BUN: 41 mg/dL — ABNORMAL HIGH (ref 8–23)
CO2: 25 mmol/L (ref 22–32)
Calcium: 9 mg/dL (ref 8.9–10.3)
Chloride: 101 mmol/L (ref 98–111)
Creatinine, Ser: 1.36 mg/dL — ABNORMAL HIGH (ref 0.44–1.00)
GFR, Estimated: 38 mL/min — ABNORMAL LOW (ref >60)
Glucose, Bld: 263 mg/dL — ABNORMAL HIGH (ref 70–99)
Potassium: 5.6 mmol/L — ABNORMAL HIGH (ref 3.5–5.1)
Sodium: 134 mmol/L — ABNORMAL LOW (ref 135–145)

## 2024-07-26 LAB — GLUCOSE, CAPILLARY
Glucose-Capillary: 237 mg/dL — ABNORMAL HIGH (ref 70–99)
Glucose-Capillary: 201 mg/dL — ABNORMAL HIGH (ref 70–99)
Glucose-Capillary: 117 mg/dL — ABNORMAL HIGH (ref 70–99)
Glucose-Capillary: 56 mg/dL — ABNORMAL LOW (ref 70–99)
Glucose-Capillary: 394 mg/dL — ABNORMAL HIGH (ref 70–99)

## 2024-07-26 LAB — POTASSIUM: Potassium: 4.8 mmol/L (ref 3.5–5.1)

## 2024-07-26 MED ORDER — SODIUM BICARBONATE 8.4 % IV SOLN
50.0000 meq | Freq: Once | INTRAVENOUS | Status: AC
  Administered 2024-07-26: 50 meq via INTRAVENOUS
  Filled 2024-07-26: qty 50

## 2024-07-26 MED ORDER — SODIUM ZIRCONIUM CYCLOSILICATE 10 G PO PACK
10.0000 g | PACK | Freq: Once | ORAL | Status: AC
  Administered 2024-07-26: 10 g via ORAL
  Filled 2024-07-26: qty 1

## 2024-07-26 MED ORDER — SODIUM CHLORIDE 0.9 % IV BOLUS
250.0000 mL | Freq: Once | INTRAVENOUS | Status: AC
  Administered 2024-07-26: 250 mL via INTRAVENOUS

## 2024-07-26 MED ORDER — OXYBUTYNIN CHLORIDE 5 MG PO TABS
5.0000 mg | ORAL_TABLET | Freq: Two times a day (BID) | ORAL | 0 refills | Status: AC
  Filled 2024-07-26: qty 14, 7d supply, fill #0

## 2024-07-26 MED ORDER — MUPIROCIN 2 % EX OINT: 1.0000 | TOPICAL_OINTMENT | Freq: Two times a day (BID) | CUTANEOUS | Status: AC

## 2024-07-26 MED ORDER — FOSFOMYCIN TROMETHAMINE 3 G PO PACK
3.0000 g | PACK | Freq: Once | ORAL | Status: AC
  Administered 2024-07-26: 3 g via ORAL
  Filled 2024-07-26: qty 3

## 2024-07-26 MED ORDER — MIDODRINE HCL 5 MG PO TABS: 2.5000 mg | ORAL_TABLET | Freq: Two times a day (BID) | ORAL | Status: DC

## 2024-07-26 MED ORDER — SODIUM BICARBONATE 4.2 % IV SOLN: 50.0000 meq | Freq: Once | INTRAVENOUS | Status: DC

## 2024-07-26 NOTE — Progress Notes (Signed)
 Patient feels dizzy and lightheaded, blood pressure is borderline low, give 250 mL of fluid bolus.  Cancel discharge, most likely will discharge home tomorrow.

## 2024-07-26 NOTE — Plan of Care (Signed)
  Problem: Coping: Goal: Ability to adjust to condition or change in health will improve Outcome: Progressing   Problem: Nutritional: Goal: Maintenance of adequate nutrition will improve Outcome: Progressing   Problem: Skin Integrity: Goal: Risk for impaired skin integrity will decrease Outcome: Progressing   Problem: Coping: Goal: Level of anxiety will decrease Outcome: Progressing

## 2024-07-26 NOTE — Plan of Care (Signed)
   Problem: Coping: Goal: Ability to adjust to condition or change in health will improve Outcome: Progressing   Problem: Fluid Volume: Goal: Ability to maintain a balanced intake and output will improve Outcome: Progressing   Problem: Health Behavior/Discharge Planning: Goal: Ability to identify and utilize available resources and services will improve Outcome: Progressing

## 2024-07-26 NOTE — Discharge Summary (Signed)
 Physician Discharge Summary   Patient: Deborah Obrien MRN: [id number] DOB: Oct 10, 1940  Admit date:     07/22/2024  Discharge date: 07/26/24  Discharge Physician: Murvin Mana   PCP: Cordella Corning, FNP   Recommendations at discharge:   Follow-up with PCP in 1 week. Check a BMP at the next office visit or within 1 week.  Discharge Diagnoses: Principal Problem:   Complicated urinary tract infection, MRSA Active Problems:   Acute renal failure superimposed on stage 3b chronic kidney disease (HCC)   Hypoglycemia, with long-term current use of insulin  (HCC)   Hypothyroidism   (HFpEF) heart failure with preserved ejection fraction (HCC)   Paroxysmal atrial fibrillation (HCC)   CAD (coronary artery disease)   Restless leg syndrome   Mixed urge and stress incontinence   Chronic anticoagulation   Sinus node dysfunction s/p permanent cardiac pacemaker   H/O TIA (transient ischemic attack) and stroke   Hyperkalemia   Inadequate oral intake   Uncontrolled type 2 diabetes mellitus with hyperglycemia, with long-term current use of insulin  (HCC)   Complicated UTI (urinary tract infection)  Resolved Problems:   * No resolved hospital problems. *  Hospital Course: Deborah Obrien is a 83 y.o. female with medical history significant for parox Afib/flutter on apixaban , SND s/p PPM, HFpEF, CAD, CKD lllb, T2DM, HTN, hypothyroid , anxiety, vascular dementia, history of hemorrhoidal Bleed 12/2023, being admitted with an MRSA urinary tract infection seen on repeat culture after she failed to improve with interrupted Cipro  outpatient treatment in the setting of a month of persistent dysuria, poor oral intake and generalized malaise She is found to have complicated urinary tract infection with MRSA.  Was treated with oral Zyvox.  Condition had improved.  She had completed 3 days dose of Zyvox, will give her dose of fosfomycin at discharge. Assessment and Plan: * Complicated urinary tract  infection, MRSA Multiple antibiotic allergies Mixed stress and urge incontinence Patient with a months of dysuria, decreased oral intake and malaise, failing Cipro  x 2 Patient was started on Zyvox after admission to the hospital, repeat urine culture has a low counts of Staph aureus, final culture result still pending. Blood culture negative.  Will continue current regimen. Patient also has urge and stress incontinence, placed on Ditropan . Patient condition much improved today, medically stable for discharge.   Repeat urine culture grew MRSA.  Patient had so far completed 3 days of Zyvox, will give her fosfomycin to complete course.    stage 3b chronic kidney disease (HCC) Acute kidney injury ruled out. Mild metabolic acidosis. Hyperkalemia. Reviewed patient records, patient does not have acute kidney injury.  Renal function still relatively stable.   Patient developed hyperkalemia with potassium 5.6, he was given 1 dose of Lokelma , potassium had dropped down to 4.8.  Elevated potassium is due to chronic kidney disease.  Please recheck a BMP at next office visit.   Uncontrolled type 2 diabetes with hyperglycemia. Hypoglycemia. Resume home regimen.  No longer has hypoglycemia.   Paroxysmal atrial fibrillation (HCC) History of TIA Continue anticoagulation.   (HFpEF) heart failure with preserved ejection fraction (HCC) Chronic hypotension on midodrine  No evidence of exacerbation   Hypothyroidism Continue levothyroxine    CAD (coronary artery disease) No complaints of chest pain and EKG is nonacute   Sinus node dysfunction s/p permanent cardiac pacemaker No acute issues suspected   Thrombocytopenia. Resolved           Consultants: None Procedures performed: None  Disposition: ALF Diet recommendation:  Discharge  Diet Orders (From admission, onward)     Start     Ordered   07/26/24 0000  Diet - low sodium heart healthy        07/26/24 1441   07/26/24 0000  Diet Carb  Modified        07/26/24 1441           Carb modified diet DISCHARGE MEDICATION: Allergies as of 07/26/2024       Reactions   Fish Allergy Shortness Of Breath   All kinds of fish. Severe vomitting even with smell of fish.    Macrolides And Ketolides Other (See Comments)   Severe stomach pain (mycins)   Meperidine Shortness Of Breath, Nausea Only, Other (See Comments)   Stomach pain    Other Nausea Only, Swelling, Rash, Anaphylaxis, Diarrhea, Shortness Of Breath, Other (See Comments)   Allergen: NON-STEROIDS, bextra - hands and feet swell Reaction:  Stomach pain   Other Reaction: Intolerance   Prednisone Anaphylaxis, Other (See Comments)   (facial swelling/redness/burning)Increases pts blood sugar  Pt states that she is allergic to all steroids.     Shellfish Allergy Anaphylaxis, Shortness Of Breath, Diarrhea, Nausea Only, Rash   Stomach pain    Sulfa Antibiotics Shortness Of Breath, Diarrhea, Nausea Only, Other (See Comments)   Reaction:  Stomach pain    Sulfacetamide Sodium Diarrhea, Nausea Only, Other (See Comments), Shortness Of Breath   Reaction:  Stomach pain    Telbivudine Other (See Comments)   Unknown reaction   Uloric [febuxostat] Anaphylaxis   Locks pt's body up   Aspirin  Rash, Other (See Comments)   Stomach pain (tolerates lower doses)   Celecoxib Other (See Comments)   GI bleed, weakness, and stomach pain.     Cephalexin Diarrhea, Nausea Only, Other (See Comments)   stomach pain    Codeine Diarrhea, Nausea Only, Other (See Comments)   Reaction:  Stomach pain Pt tolerates morphine     Dilaudid  [hydromorphone  Hcl] Other (See Comments)   Reactions: easy to overdose - blood pressure drops really low   Erythromycin Diarrhea, Nausea Only, Other (See Comments)   Reaction:  Stomach pain    Iodinated Contrast Media Rash, Other (See Comments)   Pt states that she is unable to have because she has chronic kidney disease.     Lipitor [atorvastatin Calcium ] Nausea  Only   Reaction: nausea, weakness, pass blood   Oxycodone  Other (See Comments)   Reaction:  Stomach pain    Pseudoephedrine    Aleve [naproxen]    Reaction: severe stomach pain   Atorvastatin Nausea Only, Other (See Comments)   Weakness    Cephalosporins Diarrhea, Nausea Only, Other (See Comments)   Other: abdominal pain Tolerated 1st generation cephalosporin (CEFAZOLIN ) on 06/02/2022 and 01/21/2023 without documented ADRs.    Doxycycline    Stomach pain    Iodine Rash   Motrin [ibuprofen]    Reaction: severe stomach pain   Tape Other (See Comments)   Causes pts skin to tear  Pt states that she is able to use paper tape.     Valdecoxib Swelling, Other (See Comments)   Pt states that her hands and feet swell.          Medication List     STOP taking these medications    amiodarone  200 MG tablet Commonly known as: PACERONE    Bactrim DS 800-160 MG tablet Generic drug: sulfamethoxazole-trimethoprim   ciprofloxacin  500 MG tablet Commonly known as: CIPRO    FLONASE  SENSIMIST NA  linezolid 600 MG tablet Commonly known as: ZYVOX   Mucinex  Maximum Strength 1200 MG Tb12 Generic drug: Guaifenesin    ondansetron  4 MG disintegrating tablet Commonly known as: ZOFRAN -ODT   polyethylene glycol 17 g packet Commonly known as: MIRALAX  / GLYCOLAX    Polyvinyl Alcohol -Povidone PF 1.4-0.6 % Soln   solifenacin 5 MG tablet Commonly known as: VESICARE       TAKE these medications    acetaminophen  500 MG tablet Commonly known as: TYLENOL  Take 2 tablets (1,000 mg total) by mouth every 8 (eight) hours.   albuterol  108 (90 Base) MCG/ACT inhaler Commonly known as: VENTOLIN  HFA Inhale 2 puffs into the lungs every 6 (six) hours as needed for wheezing or shortness of breath (cough).   allopurinol  100 MG tablet Commonly known as: ZYLOPRIM  Take 100 mg by mouth in the morning.   alum & mag hydroxide-simeth 200-200-20 MG/5ML suspension Commonly known as: MAALOX/MYLANTA Take 10  mLs by mouth 3 (three) times daily as needed for indigestion or heartburn.   apixaban  2.5 MG Tabs tablet Commonly known as: Eliquis  Take 1 tablet (2.5 mg total) by mouth 2 (two) times daily.   dicyclomine  10 MG capsule Commonly known as: BENTYL  Take 2 capsules (20 mg total) by mouth 3 (three) times daily before meals.   docusate sodium  100 MG capsule Commonly known as: COLACE Take 100 mg by mouth 2 (two) times daily.   famotidine  10 MG tablet Commonly known as: PEPCID  Take 2 tablets (20 mg total) by mouth 2 (two) times daily. What changed:  how much to take when to take this   furosemide  20 MG tablet Commonly known as: LASIX  Take 1 tablet (20 mg total) by mouth daily.   gabapentin  100 MG capsule Commonly known as: NEURONTIN  Take 100 mg by mouth 3 (three) times daily.   ibandronate 150 MG tablet Commonly known as: BONIVA Take 150 mg by mouth every 30 (thirty) days. Take in the morning with a full glass of water , on an empty stomach, and do not take anything else by mouth or lie down for the next 30 min.   Lantus  100 UNIT/ML injection Generic drug: insulin  glargine Inject 0.19 mLs (19 Units total) into the skin at bedtime. What changed:  how much to take when to take this   levothyroxine  75 MCG tablet Commonly known as: SYNTHROID  Take 75 mcg by mouth daily before breakfast.   magnesium  hydroxide 400 MG/5ML suspension Commonly known as: MILK OF MAGNESIA Take 30 mLs by mouth daily as needed (constipation).   methocarbamol  500 MG tablet Commonly known as: ROBAXIN  Take 1 tablet (500 mg total) by mouth 3 (three) times daily.   midodrine  5 MG tablet Commonly known as: PROAMATINE  Take 0.5 tablets (2.5 mg total) by mouth 2 (two) times daily with a meal. *DO NOT LIE DOWN AFTER TAKING, DO NOT TAKE AFTER EVENING MEAL. TAKE LAST DOSE OF THE DAY AT LEAST 4HOURS BEFORE BEDTIME* What changed:  how much to take when to take this   multivitamin with minerals Tabs tablet Take  1 tablet by mouth daily.   mupirocin  ointment 2 % Commonly known as: BACTROBAN  Place 1 Application into the nose 2 (two) times daily.   NovoLIN R FlexPen ReliOn 100 UNIT/ML FlexPen Generic drug: Insulin  Regular Human Inject 1-10 Units into the skin 3 (three) times daily with meals. Inject subcutaneously Sig: CHECK SUGARS BEFORE MEAL TIME & TAKE INSULIN  BASED ON SLIDING SCALE: <70=NO INSULIN , EAT A SNACK AND RECHECK SUGAR SOON. 70-120=0U, 121-150=2U, 151-200=4U, 201-250=5U,  251-300=6U, 301-350=8U, 351-400=10U, >400=CALL MD   nystatin  powder Apply 1 Application topically in the morning and at bedtime. (0800 & 2000) Apply topically to groin   oxybutynin  5 MG tablet Commonly known as: DITROPAN  Take 1 tablet (5 mg total) by mouth 2 (two) times daily for 7 days.   pantoprazole  40 MG tablet Commonly known as: PROTONIX  Take 1 tablet (40 mg total) by mouth 2 (two) times daily before a meal.   rOPINIRole  0.5 MG tablet Commonly known as: REQUIP  Take 0.5 mg by mouth at bedtime. (2000)   senna 8.6 MG tablet Commonly known as: SENOKOT Take 1 tablet (8.6 mg total) by mouth at bedtime as needed for constipation.   sertraline  100 MG tablet Commonly known as: ZOLOFT  Take 100 mg by mouth in the morning. (0800)   sucralfate  1 g tablet Commonly known as: Carafate  Take 1 tablet (1 g total) by mouth 4 (four) times daily.   Vitamin D  50 MCG (2000 UT) Caps Take 2,000 Units by mouth in the morning.        Follow-up Information     Cordella Corning, FNP Follow up in 1 week(s).   Specialty: Family Medicine Contact information: [address] [place] [address] [phone]                Discharge Exam: Deborah Obrien   07/23/24 1454  Weight: 70.3 kg   General exam: Appears calm and comfortable  Respiratory system: Clear to auscultation. Respiratory effort normal. Cardiovascular system: S1 & S2 heard, RRR. No JVD, murmurs, rubs, gallops or clicks. No pedal  edema. Gastrointestinal system: Abdomen is nondistended, soft and nontender. No organomegaly or masses felt. Normal bowel sounds heard. Central nervous system: Alert and oriented. No focal neurological deficits. Extremities: Symmetric 5 x 5 power. Skin: No rashes, lesions or ulcers Psychiatry: Judgement and insight appear normal. Mood & affect appropriate.    Condition at discharge: good  The results of significant diagnostics from this hospitalization (including imaging, microbiology, ancillary and laboratory) are listed below for reference.   Imaging Studies: CT ABDOMEN PELVIS WO CONTRAST Result Date: 07/23/2024 CLINICAL DATA:  Urinary tract infection. EXAM: CT ABDOMEN AND PELVIS WITHOUT CONTRAST TECHNIQUE: Multidetector CT imaging of the abdomen and pelvis was performed following the standard protocol without IV contrast. RADIATION DOSE REDUCTION: This exam was performed according to the departmental dose-optimization program which includes automated exposure control, adjustment of the mA and/or kV according to patient size and/or use of iterative reconstruction technique. COMPARISON:  05/25/2024 FINDINGS: Lower chest: Linear atelectasis or scarring noted at the lung bases. Hepatobiliary: No suspicious focal abnormality in the liver on this study without intravenous contrast. Gallbladder is surgically absent. Common bile duct measures 10 mm diameter, similar to prior and likely secondary to prior cholecystectomy. Pancreas: No focal mass lesion. No dilatation of the main duct. No intraparenchymal cyst. No peripancreatic edema. Spleen: No splenomegaly. No suspicious focal mass lesion. Adrenals/Urinary Tract: No adrenal nodule or mass. Kidneys unremarkable. No evidence for hydroureter. The urinary bladder appears normal for the degree of distention. Stomach/Bowel: Stomach is unremarkable. No gastric wall thickening. No evidence of outlet obstruction. Duodenum is normally positioned as is the ligament  of Treitz. No small bowel wall thickening. No small bowel dilatation. The terminal ileum is normal. Nonvisualization of the appendix is consistent with the reported history of appendectomy. No gross colonic mass. No colonic wall thickening. Moderate stool volume evident. Vascular/Lymphatic: There is moderate atherosclerotic calcification of the abdominal aorta without aneurysm.  There is no gastrohepatic or hepatoduodenal ligament lymphadenopathy. No retroperitoneal or mesenteric lymphadenopathy. No pelvic sidewall lymphadenopathy. Reproductive: There is no adnexal mass. Other: No intraperitoneal free fluid. Musculoskeletal: Fixation hardware noted proximal right femur, incompletely visualized. Lumbosacral fusion hardware evident. Bones are diffusely demineralized. Status post vertebral augmentation T12 and L1. IMPRESSION: 1. No acute findings in the abdomen or pelvis. No hydroureteronephrosis. No urinary stone disease. 2. Moderate stool volume. Imaging features could be compatible with clinical constipation. 3.  Aortic Atherosclerosis (ICD10-I70.0). Electronically Signed   By: Camellia Candle M.D.   On: 07/23/2024 09:28   DG Chest 2 View Result Date: 07/22/2024 EXAM: 2 VIEW(S) XRAY OF THE CHEST 07/22/2024 04:15:31 PM COMPARISON: Chest x-ray 10/28/2023 CLINICAL HISTORY: cough FINDINGS: LUNGS AND PLEURA: Small branching calcifications are seen in the bilateral upper lobes, similar to prior study. No pulmonary edema. No pleural effusion. No pneumothorax. HEART AND MEDIASTINUM: Left-sided pacemaker is again seen. No acute abnormality of the cardiac and mediastinal silhouettes. BONES AND SOFT TISSUES: There are healed right-sided rib fractures. Cervical spinal fusion plate is present. Lower thoracic vertebroplasty changes are again noted. IMPRESSION: 1. No acute findings. Electronically signed by: Greig Pique MD 07/22/2024 04:35 PM EST RP Workstation: [id number]    Microbiology: Results for orders placed or  performed during the hospital encounter of 07/22/24  Resp panel by RT-PCR (RSV, Flu A&B, Covid) Anterior Nasal Swab     Status: None   Collection Time: 07/22/24  4:03 PM   Specimen: Anterior Nasal Swab  Result Value Ref Range Status   SARS Coronavirus 2 by RT PCR NEGATIVE NEGATIVE Final    Comment: (NOTE) SARS-CoV-2 target nucleic acids are NOT DETECTED.  The SARS-CoV-2 RNA is generally detectable in upper respiratory specimens during the acute phase of infection. The lowest concentration of SARS-CoV-2 viral copies this assay can detect is 138 copies/mL. A negative result does not preclude SARS-Cov-2 infection and should not be used as the sole basis for treatment or other patient management decisions. A negative result may occur with  improper specimen collection/handling, submission of specimen other than nasopharyngeal swab, presence of viral mutation(s) within the areas targeted by this assay, and inadequate number of viral copies(<138 copies/mL). A negative result must be combined with clinical observations, patient history, and epidemiological information. The expected result is Negative.  Fact Sheet for Patients:  bloggercourse.com  Fact Sheet for Healthcare Providers:  seriousbroker.it  This test is no t yet approved or cleared by the [place]  FDA and  has been authorized for detection and/or diagnosis of SARS-CoV-2 by FDA under an Emergency Use Authorization (EUA). This EUA will remain  in effect (meaning this test can be used) for the duration of the COVID-19 declaration under Section 564(b)(1) of the Act, 21 U.S.C.section 360bbb-3(b)(1), unless the authorization is terminated  or revoked sooner.       Influenza A by PCR NEGATIVE NEGATIVE Final   Influenza B by PCR NEGATIVE NEGATIVE Final    Comment: (NOTE) The Xpert Xpress SARS-CoV-2/FLU/RSV plus assay is intended as an aid in the diagnosis of influenza from  Nasopharyngeal swab specimens and should not be used as a sole basis for treatment. Nasal washings and aspirates are unacceptable for Xpert Xpress SARS-CoV-2/FLU/RSV testing.  Fact Sheet for Patients: bloggercourse.com  Fact Sheet for Healthcare Providers: seriousbroker.it  This test is not yet approved or cleared by the [place]  FDA and has been authorized for detection and/or diagnosis of SARS-CoV-2 by FDA under an Emergency Use Authorization (EUA). This EUA  will remain in effect (meaning this test can be used) for the duration of the COVID-19 declaration under Section 564(b)(1) of the Act, 21 U.S.C. section 360bbb-3(b)(1), unless the authorization is terminated or revoked.     Resp Syncytial Virus by PCR NEGATIVE NEGATIVE Final    Comment: (NOTE) Fact Sheet for Patients: bloggercourse.com  Fact Sheet for Healthcare Providers: seriousbroker.it  This test is not yet approved or cleared by the [place]  FDA and has been authorized for detection and/or diagnosis of SARS-CoV-2 by FDA under an Emergency Use Authorization (EUA). This EUA will remain in effect (meaning this test can be used) for the duration of the COVID-19 declaration under Section 564(b)(1) of the Act, 21 U.S.C. section 360bbb-3(b)(1), unless the authorization is terminated or revoked.  Performed at [facility], [address] [place], [place] [address]   Blood culture (single)     Status: None (Preliminary result)   Collection Time: 07/22/24  4:53 PM   Specimen: BLOOD  Result Value Ref Range Status   Specimen Description BLOOD BLOOD LEFT FOREARM  Final   Special Requests   Final    BOTTLES DRAWN AEROBIC AND ANAEROBIC Blood Culture results may not be optimal due to an inadequate volume of blood received in culture bottles   Culture   Final    NO GROWTH 4 DAYS Performed at [facility], [address], [place], [place] [address]    Report Status PENDING  Incomplete  Urine Culture     Status: Abnormal   Collection Time: 07/22/24  5:39 PM   Specimen: Urine, Random  Result Value Ref Range Status   Specimen Description   Final    URINE, RANDOM Performed at [facility], [address] [place], [place] [address]    Special Requests   Final    NONE Performed at [facility], [address], [place], [place] [address]    Culture (A)  Final    80,000 COLONIES/mL METHICILLIN RESISTANT STAPHYLOCOCCUS AUREUS   Report Status 07/25/2024 FINAL  Final   Organism ID, Bacteria METHICILLIN RESISTANT STAPHYLOCOCCUS AUREUS (A)  Final      Susceptibility   Methicillin resistant staphylococcus aureus - MIC*    CIPROFLOXACIN  >=8 RESISTANT Resistant     GENTAMICIN  <=0.5 SENSITIVE Sensitive     NITROFURANTOIN  <=16 SENSITIVE Sensitive     OXACILLIN >=4 RESISTANT Resistant     TETRACYCLINE <=1 SENSITIVE Sensitive     VANCOMYCIN  1 SENSITIVE Sensitive     TRIMETH/SULFA <=10 SENSITIVE Sensitive     RIFAMPIN <=0.5 SENSITIVE Sensitive     LINEZOLID 2 SENSITIVE Sensitive     * 80,000 COLONIES/mL METHICILLIN RESISTANT STAPHYLOCOCCUS AUREUS  MRSA Next Gen by PCR, Nasal     Status: Abnormal   Collection Time: 07/24/24  2:59 PM   Specimen: Nasal Mucosa; Nasal Swab  Result Value Ref Range Status   MRSA by PCR Next Gen DETECTED (A) NOT DETECTED Final    Comment: RESULT CALLED TO, READ BACK BY AND VERIFIED WITH: Deborah Obrien 07/24/24 1947 MU (NOTE) The GeneXpert MRSA Assay (FDA approved for NASAL specimens only), is one component of a comprehensive MRSA colonization surveillance program. It is not intended to diagnose MRSA infection nor to guide or monitor treatment for MRSA infections. Test performance is not FDA approved in patients less than 35 years old. Performed at [facility], [address] [place], [place] [address]      Labs: CBC: Recent Labs  Lab 07/22/24 1415  07/23/24 0644 07/25/24 0618  WBC 5.4 3.6* 4.4  NEUTROABS 3.1  --   --   HGB 13.1 11.1* 12.2  HCT 40.6 34.2* 36.9  MCV 98.1 97.2 97.1  PLT 167 134* 173   Basic Metabolic Panel: Recent Labs  Lab 07/22/24 1415 07/23/24 0428 07/25/24 0618 07/26/24 0758 07/26/24 1322  NA 140 138 136 134*  --   K 4.2 4.5 5.5* 5.6* 4.8  CL 103 106 101 101  --   CO2 23 21* 26 25  --   GLUCOSE 136* 215* 174* 263*  --   BUN 55* 46* 38* 41*  --   CREATININE 1.88* 1.48* 1.36* 1.36*  --   CALCIUM  9.4 8.6* 9.1 9.0  --   MG  --   --  1.8  --   --    Liver Function Tests: Recent Labs  Lab 07/22/24 1415  AST 52*  ALT 36  ALKPHOS 127*  BILITOT 1.1  PROT 8.8*  ALBUMIN  4.5   CBG: Recent Labs  Lab 07/25/24 1132 07/25/24 1635 07/25/24 2046 07/26/24 0738 07/26/24 1123  GLUCAP 302* 296* 82 237* 201*    Discharge time spent: 35 minutes.  Signed: Murvin Mana, MD [facility] 07/26/2024

## 2024-07-26 NOTE — Progress Notes (Signed)
 Hypoglycemic Event  CBG: 56 at 1535.   Treatment: 8 oz juice/soda  Symptoms: Pale, Sweaty, and Shaky  Follow-up CBG: Time:1551 CBG Result:117  Possible Reasons for Event: Unknown  Comments/MD notified:Dr. Laurita notified.  D/C canceled for today.    Hendrixx Severin I Bobbye Reinitz

## 2024-07-27 ENCOUNTER — Other Ambulatory Visit: Payer: Self-pay

## 2024-07-27 DIAGNOSIS — E119 Type 2 diabetes mellitus without complications: Secondary | ICD-10-CM

## 2024-07-27 DIAGNOSIS — N3000 Acute cystitis without hematuria: Secondary | ICD-10-CM

## 2024-07-27 DIAGNOSIS — Z794 Long term (current) use of insulin: Secondary | ICD-10-CM | POA: Diagnosis not present

## 2024-07-27 DIAGNOSIS — E11649 Type 2 diabetes mellitus with hypoglycemia without coma: Secondary | ICD-10-CM | POA: Diagnosis not present

## 2024-07-27 LAB — GLUCOSE, CAPILLARY
Glucose-Capillary: 127 mg/dL — ABNORMAL HIGH (ref 70–99)
Glucose-Capillary: 337 mg/dL — ABNORMAL HIGH (ref 70–99)
Glucose-Capillary: 236 mg/dL — ABNORMAL HIGH (ref 70–99)
Glucose-Capillary: 245 mg/dL — ABNORMAL HIGH (ref 70–99)
Glucose-Capillary: 210 mg/dL — ABNORMAL HIGH (ref 70–99)

## 2024-07-27 LAB — CBC
HCT: 35 % — ABNORMAL LOW (ref 36.0–46.0)
Hemoglobin: 11.7 g/dL — ABNORMAL LOW (ref 12.0–15.0)
MCH: 32.2 pg (ref 26.0–34.0)
MCHC: 33.4 g/dL (ref 30.0–36.0)
MCV: 96.4 fL (ref 80.0–100.0)
Platelets: 153 K/uL (ref 150–400)
RBC: 3.63 MIL/uL — ABNORMAL LOW (ref 3.87–5.11)
RDW: 12.5 % (ref 11.5–15.5)
WBC: 4.2 K/uL (ref 4.0–10.5)
nRBC: 0 % (ref 0.0–0.2)

## 2024-07-27 LAB — BASIC METABOLIC PANEL WITH GFR
Anion gap: 8 (ref 5–15)
BUN: 41 mg/dL — ABNORMAL HIGH (ref 8–23)
CO2: 27 mmol/L (ref 22–32)
Calcium: 9 mg/dL (ref 8.9–10.3)
Chloride: 104 mmol/L (ref 98–111)
Creatinine, Ser: 1.36 mg/dL — ABNORMAL HIGH (ref 0.44–1.00)
GFR, Estimated: 38 mL/min — ABNORMAL LOW (ref >60)
Glucose, Bld: 137 mg/dL — ABNORMAL HIGH (ref 70–99)
Potassium: 4.9 mmol/L (ref 3.5–5.1)
Sodium: 139 mmol/L (ref 135–145)

## 2024-07-27 LAB — CULTURE, BLOOD (SINGLE): Culture: NO GROWTH

## 2024-07-27 LAB — MAGNESIUM: Magnesium: 1.9 mg/dL (ref 1.7–2.4)

## 2024-07-27 MED ORDER — MIDODRINE HCL 5 MG PO TABS
2.5000 mg | ORAL_TABLET | Freq: Two times a day (BID) | ORAL | Status: DC
  Administered 2024-07-27 – 2024-07-29 (×5): 2.5 mg via ORAL
  Filled 2024-07-27 (×5): qty 1

## 2024-07-27 NOTE — TOC Progression Note (Addendum)
 Transition of Care [facility]) - Progression Note    Patient Details  Name: Deborah Obrien MRN: [id number] Date of Birth: 1941-08-08  [facility] CM/SW Contact  Alvia Olam Fabry, RN Phone Number: 07/27/2024, 10:38 AM  Clinical Narrative:     Received notification that pt would be discharged back to [facility] today. Agency does not have personnel for transportation. RN spoke with the pt's son who will transport pt to the facility. RNCM updated Chrystal at the [facility] of pt's discharge time today around 1:00 pm for transportation.   RNCM requested MRSA PCR prior to d/c today from provider. Pt remains positive with a few days left for medication. RNCM follow up once again with [facility] who indicated pt could not return until cleared by upper management. RNCM has informed the provider and floor nurse. RNCM also contacted pt's son to not pick up pt for transport today.   The two representative Chrystal and Fonda from [employer]. I also contacted Catina [phone] but her voice message box was full and I was not able to left a message. Will request follow up on Monday concerning pt's discharge disposition.                    Expected Discharge Plan and Services         Expected Discharge Date: 07/27/24                                     Social Drivers of Health (SDOH) Interventions SDOH Screenings   Food Insecurity: No Food Insecurity (07/23/2024)  Housing: Unknown (07/23/2024)  Transportation Needs: No Transportation Needs (07/23/2024)  Utilities: Not At Risk (07/23/2024)  Financial Resource Strain: Low Risk  (05/23/2024)   Received from [facility]  Physical Activity: Unknown (02/19/2019)   Received from [facility]  Social Connections: Socially Integrated (07/23/2024)  Stress: Stress Concern Present (02/19/2019)   Received from [facility]  Tobacco Use: Low Risk   (07/22/2024)    Readmission Risk Interventions    05/27/2024   10:43 AM 01/10/2024    9:49 AM 10/30/2023    4:25 PM  Readmission Risk Prevention Plan  Transportation Screening Complete  Complete  Medication Review (RN Care Manager)   Complete  PCP or Specialist appointment within 3-5 days of discharge Complete Complete Complete  HRI or Home Care Consult Complete Complete Complete  SW Recovery Care/Counseling Consult Complete Complete   Palliative Care Screening Not Applicable Not Applicable Complete  Skilled Nursing Facility Not Applicable Not Applicable Complete

## 2024-07-27 NOTE — Discharge Summary (Signed)
 Physician Discharge Summary   Patient: Deborah Obrien MRN: [id number] DOB: [birth year]  Admit date:     07/22/2024  Discharge date: 07/27/24  Discharge Physician: Murvin Mana   PCP: Cordella Corning, FNP   Recommendations at discharge:    Follow-up with PCP in 1 week. Check a BMP at the next office visit or within 1 week.  Discharge Diagnoses: Principal Problem:   Complicated urinary tract infection, MRSA Active Problems:   Acute renal failure superimposed on stage 3b chronic kidney disease (HCC)   Hypoglycemia, with long-term current use of insulin  (HCC)   Hypothyroidism   (HFpEF) heart failure with preserved ejection fraction (HCC)   Paroxysmal atrial fibrillation (HCC)   CAD (coronary artery disease)   Restless leg syndrome   Mixed urge and stress incontinence   Chronic anticoagulation   Sinus node dysfunction s/p permanent cardiac pacemaker   H/O TIA (transient ischemic attack) and stroke   Hyperkalemia   Inadequate oral intake   Uncontrolled type 2 diabetes mellitus with hyperglycemia, with long-term current use of insulin  (HCC)   Complicated UTI (urinary tract infection)  Resolved Problems:   * No resolved hospital problems. *  Hospital Course: Deborah Obrien is a 83 y.o. female with medical history significant for parox Afib/flutter on apixaban , SND s/p PPM, HFpEF, CAD, CKD lllb, T2DM, HTN, hypothyroid , anxiety, vascular dementia, history of hemorrhoidal Bleed 12/2023, being admitted with an MRSA urinary tract infection seen on repeat culture after she failed to improve with interrupted Cipro  outpatient treatment in the setting of a month of persistent dysuria, poor oral intake and generalized malaise She is found to have complicated urinary tract infection with MRSA.  Was treated with oral Zyvox.  Condition had improved.  She had completed 3 days dose of Zyvox, and a dose of fosfomycin 11/14.  She was discharged yesterday, but before discharge, she felt a little  dizzy.  She was held for another day today.  She feels better, medically stable for discharge. Assessment and Plan: * Complicated urinary tract infection, MRSA Multiple antibiotic allergies Mixed stress and urge incontinence Patient with a months of dysuria, decreased oral intake and malaise, failing Cipro  x 2 Patient was started on Zyvox after admission to the hospital, repeat urine culture has a low counts of Staph aureus, final culture result still pending. Blood culture negative.  Will continue current regimen. Patient also has urge and stress incontinence, placed on Ditropan . Patient condition much improved today, medically stable for discharge.   Repeat urine culture grew MRSA.  Patient had so far completed 3 days of Zyvox, will give her fosfomycin to complete course.    stage 3b chronic kidney disease (HCC) Acute kidney injury ruled out. Mild metabolic acidosis. Hyperkalemia. Reviewed patient records, patient does not have acute kidney injury.  Renal function still relatively stable.   Patient developed hyperkalemia with potassium 5.6, he was given 1 dose of Lokelma , potassium had dropped down to 4.8.  Elevated potassium is due to chronic kidney disease.  Repeated BMP today still showed normal potassium.  Medically stable for discharge.  Will need to check his BMP at the next office visit.   Uncontrolled type 2 diabetes with hyperglycemia. Hypoglycemia. Patient glucose has been labile, occasional hypoglycemia still.  Feel that lower dose of Lantus  will be more appropriate.  As a result, patient may resumed her home regimen which is lower than current hospital dose.   Paroxysmal atrial fibrillation (HCC) History of TIA Continue anticoagulation.   (HFpEF) heart failure  with preserved ejection fraction (HCC) Chronic hypotension on midodrine  No evidence of exacerbation   Hypothyroidism Continue levothyroxine    CAD (coronary artery disease) No complaints of chest pain and EKG is  nonacute   Sinus node dysfunction s/p permanent cardiac pacemaker No acute issues suspected   Thrombocytopenia. Resolved       Consultants: None Procedures performed: None  Disposition: Assisted living Diet recommendation:  Discharge Diet Orders (From admission, onward)     Start     Ordered   07/26/24 0000  Diet - low sodium heart healthy        07/26/24 1441   07/26/24 0000  Diet Carb Modified        07/26/24 1441           Carb modified diet DISCHARGE MEDICATION: Allergies as of 07/27/2024       Reactions   Fish Allergy Shortness Of Breath   All kinds of fish. Severe vomitting even with smell of fish.    Macrolides And Ketolides Other (See Comments)   Severe stomach pain (mycins)   Meperidine Shortness Of Breath, Nausea Only, Other (See Comments)   Stomach pain    Other Nausea Only, Swelling, Rash, Anaphylaxis, Diarrhea, Shortness Of Breath, Other (See Comments)   Allergen: NON-STEROIDS, bextra - hands and feet swell Reaction:  Stomach pain   Other Reaction: Intolerance   Prednisone Anaphylaxis, Other (See Comments)   (facial swelling/redness/burning)Increases pts blood sugar  Pt states that she is allergic to all steroids.     Shellfish Allergy Anaphylaxis, Shortness Of Breath, Diarrhea, Nausea Only, Rash   Stomach pain    Sulfa Antibiotics Shortness Of Breath, Diarrhea, Nausea Only, Other (See Comments)   Reaction:  Stomach pain    Sulfacetamide Sodium Diarrhea, Nausea Only, Other (See Comments), Shortness Of Breath   Reaction:  Stomach pain    Telbivudine Other (See Comments)   Unknown reaction   Uloric [febuxostat] Anaphylaxis   Locks pt's body up   Aspirin  Rash, Other (See Comments)   Stomach pain (tolerates lower doses)   Celecoxib Other (See Comments)   GI bleed, weakness, and stomach pain.     Cephalexin Diarrhea, Nausea Only, Other (See Comments)   stomach pain    Codeine Diarrhea, Nausea Only, Other (See Comments)   Reaction:  Stomach  pain Pt tolerates morphine     Dilaudid  [hydromorphone  Hcl] Other (See Comments)   Reactions: easy to overdose - blood pressure drops really low   Erythromycin Diarrhea, Nausea Only, Other (See Comments)   Reaction:  Stomach pain    Iodinated Contrast Media Rash, Other (See Comments)   Pt states that she is unable to have because she has chronic kidney disease.     Lipitor [atorvastatin Calcium ] Nausea Only   Reaction: nausea, weakness, pass blood   Oxycodone  Other (See Comments)   Reaction:  Stomach pain    Pseudoephedrine    Aleve [naproxen]    Reaction: severe stomach pain   Atorvastatin Nausea Only, Other (See Comments)   Weakness    Cephalosporins Diarrhea, Nausea Only, Other (See Comments)   Other: abdominal pain Tolerated 1st generation cephalosporin (CEFAZOLIN ) on 06/02/2022 and 01/21/2023 without documented ADRs.    Doxycycline    Stomach pain    Iodine Rash   Motrin [ibuprofen]    Reaction: severe stomach pain   Tape Other (See Comments)   Causes pts skin to tear  Pt states that she is able to use paper tape.     Valdecoxib Swelling, Other (  See Comments)   Pt states that her hands and feet swell.          Medication List     STOP taking these medications    amiodarone  200 MG tablet Commonly known as: PACERONE    Bactrim DS 800-160 MG tablet Generic drug: sulfamethoxazole-trimethoprim   ciprofloxacin  500 MG tablet Commonly known as: CIPRO    FLONASE  SENSIMIST NA   linezolid 600 MG tablet Commonly known as: ZYVOX   Mucinex  Maximum Strength 1200 MG Tb12 Generic drug: Guaifenesin    ondansetron  4 MG disintegrating tablet Commonly known as: ZOFRAN -ODT   polyethylene glycol 17 g packet Commonly known as: MIRALAX  / GLYCOLAX    Polyvinyl Alcohol -Povidone PF 1.4-0.6 % Soln   solifenacin 5 MG tablet Commonly known as: VESICARE       TAKE these medications    acetaminophen  500 MG tablet Commonly known as: TYLENOL  Take 2 tablets (1,000 mg total) by  mouth every 8 (eight) hours.   albuterol  108 (90 Base) MCG/ACT inhaler Commonly known as: VENTOLIN  HFA Inhale 2 puffs into the lungs every 6 (six) hours as needed for wheezing or shortness of breath (cough).   allopurinol  100 MG tablet Commonly known as: ZYLOPRIM  Take 100 mg by mouth in the morning.   alum & mag hydroxide-simeth 200-200-20 MG/5ML suspension Commonly known as: MAALOX/MYLANTA Take 10 mLs by mouth 3 (three) times daily as needed for indigestion or heartburn.   apixaban  2.5 MG Tabs tablet Commonly known as: Eliquis  Take 1 tablet (2.5 mg total) by mouth 2 (two) times daily.   dicyclomine  10 MG capsule Commonly known as: BENTYL  Take 2 capsules (20 mg total) by mouth 3 (three) times daily before meals.   docusate sodium  100 MG capsule Commonly known as: COLACE Take 100 mg by mouth 2 (two) times daily.   famotidine  10 MG tablet Commonly known as: PEPCID  Take 2 tablets (20 mg total) by mouth 2 (two) times daily. What changed:  how much to take when to take this   furosemide  20 MG tablet Commonly known as: LASIX  Take 1 tablet (20 mg total) by mouth daily.   gabapentin  100 MG capsule Commonly known as: NEURONTIN  Take 100 mg by mouth 3 (three) times daily.   ibandronate 150 MG tablet Commonly known as: BONIVA Take 150 mg by mouth every 30 (thirty) days. Take in the morning with a full glass of water , on an empty stomach, and do not take anything else by mouth or lie down for the next 30 min.   Lantus  100 UNIT/ML injection Generic drug: insulin  glargine Inject 0.19 mLs (19 Units total) into the skin at bedtime. What changed:  how much to take when to take this   levothyroxine  75 MCG tablet Commonly known as: SYNTHROID  Take 75 mcg by mouth daily before breakfast.   magnesium  hydroxide 400 MG/5ML suspension Commonly known as: MILK OF MAGNESIA Take 30 mLs by mouth daily as needed (constipation).   methocarbamol  500 MG tablet Commonly known as:  ROBAXIN  Take 1 tablet (500 mg total) by mouth 3 (three) times daily.   midodrine  5 MG tablet Commonly known as: PROAMATINE  Take 0.5 tablets (2.5 mg total) by mouth 2 (two) times daily with a meal. *DO NOT LIE DOWN AFTER TAKING, DO NOT TAKE AFTER EVENING MEAL. TAKE LAST DOSE OF THE DAY AT LEAST 4HOURS BEFORE BEDTIME* What changed:  how much to take when to take this   multivitamin with minerals Tabs tablet Take 1 tablet by mouth daily.   mupirocin  ointment 2 %  Commonly known as: BACTROBAN  Place 1 Application into the nose 2 (two) times daily.   NovoLIN R FlexPen ReliOn 100 UNIT/ML FlexPen Generic drug: Insulin  Regular Human Inject 1-10 Units into the skin 3 (three) times daily with meals. Inject subcutaneously Sig: CHECK SUGARS BEFORE MEAL TIME & TAKE INSULIN  BASED ON SLIDING SCALE: <70=NO INSULIN , EAT A SNACK AND RECHECK SUGAR SOON. 70-120=0U, 121-150=2U, 151-200=4U, 201-250=5U, 251-300=6U, 301-350=8U, 351-400=10U, >400=CALL MD   nystatin  powder Apply 1 Application topically in the morning and at bedtime. (0800 & 2000) Apply topically to groin   oxybutynin  5 MG tablet Commonly known as: DITROPAN  Take 1 tablet (5 mg total) by mouth 2 (two) times daily for 7 days.   pantoprazole  40 MG tablet Commonly known as: PROTONIX  Take 1 tablet (40 mg total) by mouth 2 (two) times daily before a meal.   rOPINIRole  0.5 MG tablet Commonly known as: REQUIP  Take 0.5 mg by mouth at bedtime. (2000)   senna 8.6 MG tablet Commonly known as: SENOKOT Take 1 tablet (8.6 mg total) by mouth at bedtime as needed for constipation.   sertraline  100 MG tablet Commonly known as: ZOLOFT  Take 100 mg by mouth in the morning. (0800)   sucralfate  1 g tablet Commonly known as: Carafate  Take 1 tablet (1 g total) by mouth 4 (four) times daily.   Vitamin D  50 MCG (2000 UT) Caps Take 2,000 Units by mouth in the morning.        Follow-up Information     Cordella Corning, FNP Follow up in 1 week(s).    Specialty: Family Medicine Contact information: [address] [place] [address] [phone]                Discharge Exam: Fredricka Weights   07/23/24 1454  Weight: 70.3 kg   General exam: Appears calm and comfortable  Respiratory system: Clear to auscultation. Respiratory effort normal. Cardiovascular system: S1 & S2 heard, RRR. No JVD, murmurs, rubs, gallops or clicks. No pedal edema. Gastrointestinal system: Abdomen is nondistended, soft and nontender. No organomegaly or masses felt. Normal bowel sounds heard. Central nervous system: Alert and oriented. No focal neurological deficits. Extremities: Symmetric 5 x 5 power. Skin: No rashes, lesions or ulcers Psychiatry: Judgement and insight appear normal. Mood & affect appropriate.    Condition at discharge: good  The results of significant diagnostics from this hospitalization (including imaging, microbiology, ancillary and laboratory) are listed below for reference.   Imaging Studies: CT ABDOMEN PELVIS WO CONTRAST Result Date: 07/23/2024 CLINICAL DATA:  Urinary tract infection. EXAM: CT ABDOMEN AND PELVIS WITHOUT CONTRAST TECHNIQUE: Multidetector CT imaging of the abdomen and pelvis was performed following the standard protocol without IV contrast. RADIATION DOSE REDUCTION: This exam was performed according to the departmental dose-optimization program which includes automated exposure control, adjustment of the mA and/or kV according to patient size and/or use of iterative reconstruction technique. COMPARISON:  05/25/2024 FINDINGS: Lower chest: Linear atelectasis or scarring noted at the lung bases. Hepatobiliary: No suspicious focal abnormality in the liver on this study without intravenous contrast. Gallbladder is surgically absent. Common bile duct measures 10 mm diameter, similar to prior and likely secondary to prior cholecystectomy. Pancreas: No focal mass lesion. No dilatation of the main duct. No  intraparenchymal cyst. No peripancreatic edema. Spleen: No splenomegaly. No suspicious focal mass lesion. Adrenals/Urinary Tract: No adrenal nodule or mass. Kidneys unremarkable. No evidence for hydroureter. The urinary bladder appears normal for the degree of distention. Stomach/Bowel: Stomach is unremarkable. No  gastric wall thickening. No evidence of outlet obstruction. Duodenum is normally positioned as is the ligament of Treitz. No small bowel wall thickening. No small bowel dilatation. The terminal ileum is normal. Nonvisualization of the appendix is consistent with the reported history of appendectomy. No gross colonic mass. No colonic wall thickening. Moderate stool volume evident. Vascular/Lymphatic: There is moderate atherosclerotic calcification of the abdominal aorta without aneurysm. There is no gastrohepatic or hepatoduodenal ligament lymphadenopathy. No retroperitoneal or mesenteric lymphadenopathy. No pelvic sidewall lymphadenopathy. Reproductive: There is no adnexal mass. Other: No intraperitoneal free fluid. Musculoskeletal: Fixation hardware noted proximal right femur, incompletely visualized. Lumbosacral fusion hardware evident. Bones are diffusely demineralized. Status post vertebral augmentation T12 and L1. IMPRESSION: 1. No acute findings in the abdomen or pelvis. No hydroureteronephrosis. No urinary stone disease. 2. Moderate stool volume. Imaging features could be compatible with clinical constipation. 3.  Aortic Atherosclerosis (ICD10-I70.0). Electronically Signed   By: Camellia Candle M.D.   On: 07/23/2024 09:28   DG Chest 2 View Result Date: 07/22/2024 EXAM: 2 VIEW(S) XRAY OF THE CHEST 07/22/2024 04:15:31 PM COMPARISON: Chest x-ray 10/28/2023 CLINICAL HISTORY: cough FINDINGS: LUNGS AND PLEURA: Small branching calcifications are seen in the bilateral upper lobes, similar to prior study. No pulmonary edema. No pleural effusion. No pneumothorax. HEART AND MEDIASTINUM: Left-sided pacemaker  is again seen. No acute abnormality of the cardiac and mediastinal silhouettes. BONES AND SOFT TISSUES: There are healed right-sided rib fractures. Cervical spinal fusion plate is present. Lower thoracic vertebroplasty changes are again noted. IMPRESSION: 1. No acute findings. Electronically signed by: Greig Pique MD 07/22/2024 04:35 PM EST RP Workstation: [id number]    Microbiology: Results for orders placed or performed during the hospital encounter of 07/22/24  Resp panel by RT-PCR (RSV, Flu A&B, Covid) Anterior Nasal Swab     Status: None   Collection Time: 07/22/24  4:03 PM   Specimen: Anterior Nasal Swab  Result Value Ref Range Status   SARS Coronavirus 2 by RT PCR NEGATIVE NEGATIVE Final    Comment: (NOTE) SARS-CoV-2 target nucleic acids are NOT DETECTED.  The SARS-CoV-2 RNA is generally detectable in upper respiratory specimens during the acute phase of infection. The lowest concentration of SARS-CoV-2 viral copies this assay can detect is 138 copies/mL. A negative result does not preclude SARS-Cov-2 infection and should not be used as the sole basis for treatment or other patient management decisions. A negative result may occur with  improper specimen collection/handling, submission of specimen other than nasopharyngeal swab, presence of viral mutation(s) within the areas targeted by this assay, and inadequate number of viral copies(<138 copies/mL). A negative result must be combined with clinical observations, patient history, and epidemiological information. The expected result is Negative.  Fact Sheet for Patients:  bloggercourse.com  Fact Sheet for Healthcare Providers:  seriousbroker.it  This test is no t yet approved or cleared by the [place]  FDA and  has been authorized for detection and/or diagnosis of SARS-CoV-2 by FDA under an Emergency Use Authorization (EUA). This EUA will remain  in effect (meaning this  test can be used) for the duration of the COVID-19 declaration under Section 564(b)(1) of the Act, 21 U.S.C.section 360bbb-3(b)(1), unless the authorization is terminated  or revoked sooner.       Influenza A by PCR NEGATIVE NEGATIVE Final   Influenza B by PCR NEGATIVE NEGATIVE Final    Comment: (NOTE) The Xpert Xpress SARS-CoV-2/FLU/RSV plus assay is intended as an aid in the diagnosis of influenza from Nasopharyngeal swab specimens and  should not be used as a sole basis for treatment. Nasal washings and aspirates are unacceptable for Xpert Xpress SARS-CoV-2/FLU/RSV testing.  Fact Sheet for Patients: bloggercourse.com  Fact Sheet for Healthcare Providers: seriousbroker.it  This test is not yet approved or cleared by the [place]  FDA and has been authorized for detection and/or diagnosis of SARS-CoV-2 by FDA under an Emergency Use Authorization (EUA). This EUA will remain in effect (meaning this test can be used) for the duration of the COVID-19 declaration under Section 564(b)(1) of the Act, 21 U.S.C. section 360bbb-3(b)(1), unless the authorization is terminated or revoked.     Resp Syncytial Virus by PCR NEGATIVE NEGATIVE Final    Comment: (NOTE) Fact Sheet for Patients: bloggercourse.com  Fact Sheet for Healthcare Providers: seriousbroker.it  This test is not yet approved or cleared by the [place]  FDA and has been authorized for detection and/or diagnosis of SARS-CoV-2 by FDA under an Emergency Use Authorization (EUA). This EUA will remain in effect (meaning this test can be used) for the duration of the COVID-19 declaration under Section 564(b)(1) of the Act, 21 U.S.C. section 360bbb-3(b)(1), unless the authorization is terminated or revoked.  Performed at [facility], [address] [place], [place] [address]   Blood culture (single)      Status: None   Collection Time: 07/22/24  4:53 PM   Specimen: BLOOD  Result Value Ref Range Status   Specimen Description BLOOD BLOOD LEFT FOREARM  Final   Special Requests   Final    BOTTLES DRAWN AEROBIC AND ANAEROBIC Blood Culture results may not be optimal due to an inadequate volume of blood received in culture bottles   Culture   Final    NO GROWTH 5 DAYS Performed at [facility], [address] [place], [place] [address]    Report Status 07/27/2024 FINAL  Final  Urine Culture     Status: Abnormal   Collection Time: 07/22/24  5:39 PM   Specimen: Urine, Random  Result Value Ref Range Status   Specimen Description   Final    URINE, RANDOM Performed at [facility], [address], [place], [place] [address]    Special Requests   Final    NONE Performed at [facility], [address], [place], [place] [address]    Culture (A)  Final    80,000 COLONIES/mL METHICILLIN RESISTANT STAPHYLOCOCCUS AUREUS   Report Status 07/25/2024 FINAL  Final   Organism ID, Bacteria METHICILLIN RESISTANT STAPHYLOCOCCUS AUREUS (A)  Final      Susceptibility   Methicillin resistant staphylococcus aureus - MIC*    CIPROFLOXACIN  >=8 RESISTANT Resistant     GENTAMICIN  <=0.5 SENSITIVE Sensitive     NITROFURANTOIN  <=16 SENSITIVE Sensitive     OXACILLIN >=4 RESISTANT Resistant     TETRACYCLINE <=1 SENSITIVE Sensitive     VANCOMYCIN  1 SENSITIVE Sensitive     TRIMETH/SULFA <=10 SENSITIVE Sensitive     RIFAMPIN <=0.5 SENSITIVE Sensitive     LINEZOLID 2 SENSITIVE Sensitive     * 80,000 COLONIES/mL METHICILLIN RESISTANT STAPHYLOCOCCUS AUREUS  MRSA Next Gen by PCR, Nasal     Status: Abnormal   Collection Time: 07/24/24  2:59 PM   Specimen: Nasal Mucosa; Nasal Swab  Result Value Ref Range Status   MRSA by PCR Next Gen DETECTED (A) NOT DETECTED Final    Comment: RESULT CALLED TO, READ BACK BY AND VERIFIED WITH: EVERJOY BEMBERE 07/24/24 1947 MU (NOTE) The GeneXpert MRSA  Assay (FDA approved for  NASAL specimens only), is one component of a comprehensive MRSA colonization surveillance program. It is not intended to diagnose MRSA infection nor to guide or monitor treatment for MRSA infections. Test performance is not FDA approved in patients less than 52 years old. Performed at [facility], [address] [place], [place] [address]     Labs: CBC: Recent Labs  Lab 07/22/24 1415 07/23/24 0644 07/25/24 0618 07/27/24 0601  WBC 5.4 3.6* 4.4 4.2  NEUTROABS 3.1  --   --   --   HGB 13.1 11.1* 12.2 11.7*  HCT 40.6 34.2* 36.9 35.0*  MCV 98.1 97.2 97.1 96.4  PLT 167 134* 173 153   Basic Metabolic Panel: Recent Labs  Lab 07/22/24 1415 07/23/24 0428 07/25/24 0618 07/26/24 0758 07/26/24 1322 07/27/24 0601  NA 140 138 136 134*  --  139  K 4.2 4.5 5.5* 5.6* 4.8 4.9  CL 103 106 101 101  --  104  CO2 23 21* 26 25  --  27  GLUCOSE 136* 215* 174* 263*  --  137*  BUN 55* 46* 38* 41*  --  41*  CREATININE 1.88* 1.48* 1.36* 1.36*  --  1.36*  CALCIUM  9.4 8.6* 9.1 9.0  --  9.0  MG  --   --  1.8  --   --  1.9   Liver Function Tests: Recent Labs  Lab 07/22/24 1415  AST 52*  ALT 36  ALKPHOS 127*  BILITOT 1.1  PROT 8.8*  ALBUMIN  4.5   CBG: Recent Labs  Lab 07/26/24 1123 07/26/24 1535 07/26/24 1551 07/26/24 2046 07/27/24 0745  GLUCAP 201* 56* 117* 394* 127*    Discharge time spent: 35 minutes.  Signed: Murvin Mana, MD [facility] 07/27/2024

## 2024-07-27 NOTE — Plan of Care (Signed)

## 2024-07-28 ENCOUNTER — Other Ambulatory Visit: Payer: Self-pay

## 2024-07-28 DIAGNOSIS — E11649 Type 2 diabetes mellitus with hypoglycemia without coma: Secondary | ICD-10-CM | POA: Diagnosis not present

## 2024-07-28 DIAGNOSIS — N390 Urinary tract infection, site not specified: Secondary | ICD-10-CM | POA: Diagnosis not present

## 2024-07-28 DIAGNOSIS — I480 Paroxysmal atrial fibrillation: Secondary | ICD-10-CM | POA: Diagnosis not present

## 2024-07-28 DIAGNOSIS — Z794 Long term (current) use of insulin: Secondary | ICD-10-CM | POA: Diagnosis not present

## 2024-07-28 LAB — GLUCOSE, CAPILLARY
Glucose-Capillary: 112 mg/dL — ABNORMAL HIGH (ref 70–99)
Glucose-Capillary: 208 mg/dL — ABNORMAL HIGH (ref 70–99)
Glucose-Capillary: 118 mg/dL — ABNORMAL HIGH (ref 70–99)
Glucose-Capillary: 248 mg/dL — ABNORMAL HIGH (ref 70–99)

## 2024-07-28 MED ORDER — LOPERAMIDE HCL 2 MG PO CAPS
2.0000 mg | ORAL_CAPSULE | ORAL | Status: DC | PRN
  Administered 2024-07-28 – 2024-07-29 (×2): 2 mg via ORAL
  Filled 2024-07-28 (×2): qty 1

## 2024-07-28 NOTE — Progress Notes (Signed)
 Progress Note   Patient: Deborah Obrien FMW:[id number] DOB: May 25, 1941 DOA: 07/22/2024     4 DOS: the patient was seen and examined on 07/28/2024   Brief hospital course: TYMBER STALLINGS is a 83 y.o. female with medical history significant for parox Afib/flutter on apixaban , SND s/p PPM, HFpEF, CAD, CKD lllb, T2DM, HTN, hypothyroid , anxiety, vascular dementia, history of hemorrhoidal Bleed 12/2023, being admitted with an MRSA urinary tract infection seen on repeat culture after she failed to improve with interrupted Cipro  outpatient treatment in the setting of a month of persistent dysuria, poor oral intake and generalized malaise Patient has been stabilized, medically stable for discharge.  However, [facility] refuses to accept the patient.  Principal Problem:   Complicated urinary tract infection, MRSA Active Problems:   Acute renal failure superimposed on stage 3b chronic kidney disease (HCC)   Hypoglycemia, with long-term current use of insulin  (HCC)   Hypothyroidism   (HFpEF) heart failure with preserved ejection fraction (HCC)   Paroxysmal atrial fibrillation (HCC)   CAD (coronary artery disease)   Restless leg syndrome   Mixed urge and stress incontinence   Chronic anticoagulation   Sinus node dysfunction s/p permanent cardiac pacemaker   H/O TIA (transient ischemic attack) and stroke   Hyperkalemia   Inadequate oral intake   Uncontrolled type 2 diabetes mellitus with hyperglycemia, with long-term current use of insulin  (HCC)   Complicated UTI (urinary tract infection)   Assessment and Plan: * Complicated urinary tract infection, MRSA Multiple antibiotic allergies Mixed stress and urge incontinence Patient with a months of dysuria, decreased oral intake and malaise, failing Cipro  x 2 Patient was started on Zyvox after admission to the hospital, repeat urine culture has a low counts of Staph aureus, final culture result still pending. Blood culture negative.  Will  continue current regimen. Patient also has urge and stress incontinence, placed on Ditropan . Patient condition much improved today, medically stable for discharge.   Repeat urine culture grew MRSA.  Patient had so far completed 3 days of Zyvox, will give her fosfomycin to complete course.    stage 3b chronic kidney disease (HCC) Acute kidney injury ruled out. Mild metabolic acidosis. Hyperkalemia. Reviewed patient records, patient does not have acute kidney injury.  Renal function still relatively stable.   Patient developed hyperkalemia with potassium 5.6, he was given 1 dose of Lokelma , potassium had dropped down to 4.8.  Elevated potassium is due to chronic kidney disease.  Repeated BMP today still showed normal potassium.  Medically stable for discharge.  [facility] initially accepted patient to go back on Saturday, but refused to take her yesterday.  Now we will not accept her until next Monday or Tuesday. Recheck of BMP tomorrow.   Uncontrolled type 2 diabetes with hyperglycemia. Hypoglycemia. Patient glucose has been labile, occasional hypoglycemia still.  Continue current regimen.   Paroxysmal atrial fibrillation (HCC) History of TIA Continue anticoagulation.   (HFpEF) heart failure with preserved ejection fraction (HCC) Chronic hypotension on midodrine  No evidence of exacerbation   Hypothyroidism Continue levothyroxine    CAD (coronary artery disease) No complaints of chest pain and EKG is nonacute   Sinus node dysfunction s/p permanent cardiac pacemaker No acute issues suspected   Thrombocytopenia. Resolved        Subjective:  Patient doing well today, good appetite without nausea vomiting.  Had bowel movements.   Physical Exam: Vitals:   07/27/24 1945 07/27/24 2000 07/28/24 0506 07/28/24 0746  BP: 112/68  (!) 122/46 (!) 109/90  Pulse: 88  60 68  Resp: 18  18 16   Temp: 98.3 F (36.8 C)  98.3 F (36.8 C) 98.2 F (36.8 C)  TempSrc: Oral   Oral   SpO2: 98%  97% 100%  Weight:      Height:  5' 3 (1.6 m)     General exam: Appears calm and comfortable  Respiratory system: Clear to auscultation. Respiratory effort normal. Cardiovascular system: S1 & S2 heard, RRR. No JVD, murmurs, rubs, gallops or clicks. No pedal edema. Gastrointestinal system: Abdomen is nondistended, soft and nontender. No organomegaly or masses felt. Normal bowel sounds heard. Central nervous system: Alert and oriented. No focal neurological deficits. Extremities: Symmetric 5 x 5 power. Skin: No rashes, lesions or ulcers Psychiatry: Judgement and insight appear normal. Mood & affect appropriate.    Data Reviewed:  There are no new results to review at this time.  Family Communication: None  Disposition: Status is: Inpatient Remains inpatient appropriate because: Assisted living facility will not accept patient.     Time spent: 35 minutes  Author: Murvin Mana, MD 07/28/2024 1:59 PM  For on call review [email or web address].

## 2024-07-29 ENCOUNTER — Other Ambulatory Visit: Payer: Self-pay

## 2024-07-29 DIAGNOSIS — Z794 Long term (current) use of insulin: Secondary | ICD-10-CM | POA: Diagnosis not present

## 2024-07-29 DIAGNOSIS — N390 Urinary tract infection, site not specified: Secondary | ICD-10-CM | POA: Diagnosis not present

## 2024-07-29 DIAGNOSIS — I480 Paroxysmal atrial fibrillation: Secondary | ICD-10-CM | POA: Diagnosis not present

## 2024-07-29 DIAGNOSIS — E11649 Type 2 diabetes mellitus with hypoglycemia without coma: Secondary | ICD-10-CM | POA: Diagnosis not present

## 2024-07-29 LAB — BASIC METABOLIC PANEL WITH GFR
Anion gap: 7 (ref 5–15)
BUN: 35 mg/dL — ABNORMAL HIGH (ref 8–23)
CO2: 27 mmol/L (ref 22–32)
Calcium: 9.2 mg/dL (ref 8.9–10.3)
Chloride: 105 mmol/L (ref 98–111)
Creatinine, Ser: 1.29 mg/dL — ABNORMAL HIGH (ref 0.44–1.00)
GFR, Estimated: 41 mL/min — ABNORMAL LOW (ref >60)
Glucose, Bld: 161 mg/dL — ABNORMAL HIGH (ref 70–99)
Potassium: 5.1 mmol/L (ref 3.5–5.1)
Sodium: 139 mmol/L (ref 135–145)

## 2024-07-29 LAB — GLUCOSE, CAPILLARY
Glucose-Capillary: 101 mg/dL — ABNORMAL HIGH (ref 70–99)
Glucose-Capillary: 253 mg/dL — ABNORMAL HIGH (ref 70–99)

## 2024-07-29 MED ORDER — SODIUM ZIRCONIUM CYCLOSILICATE 10 G PO PACK
10.0000 g | PACK | Freq: Once | ORAL | Status: AC
  Administered 2024-07-29: 10 g via ORAL
  Filled 2024-07-29: qty 1

## 2024-07-29 MED ORDER — LOKELMA 5 G PO PACK
5.0000 g | PACK | ORAL | 0 refills | Status: DC
  Filled 2024-07-29: qty 30, 30d supply, fill #0

## 2024-07-29 NOTE — Discharge Summary (Signed)
 Physician Discharge Summary   Patient: Deborah Obrien MRN: [id number] DOB: 06/25/1941  Admit date:     07/22/2024  Discharge date: 07/29/24  Discharge Physician: Murvin Mana   PCP: Cordella Corning, FNP   Recommendations at discharge:   Follow-up with PCP in 1 week. Check BMP in 1 week.  Discharge Diagnoses: Principal Problem:   Complicated urinary tract infection, MRSA Active Problems:   Acute renal failure superimposed on stage 3b chronic kidney disease (HCC)   Hypoglycemia, with long-term current use of insulin  (HCC)   Hypothyroidism   (HFpEF) heart failure with preserved ejection fraction (HCC)   Paroxysmal atrial fibrillation (HCC)   CAD (coronary artery disease)   Restless leg syndrome   Mixed urge and stress incontinence   Chronic anticoagulation   Sinus node dysfunction s/p permanent cardiac pacemaker   H/O TIA (transient ischemic attack) and stroke   Hyperkalemia   Inadequate oral intake   Uncontrolled type 2 diabetes mellitus with hyperglycemia, with long-term current use of insulin  (HCC)   Complicated UTI (urinary tract infection)  Resolved Problems:   * No resolved hospital problems. *  Hospital Course: VERDINE Obrien is a 83 y.o. female with medical history significant for parox Afib/flutter on apixaban , SND s/p PPM, HFpEF, CAD, CKD lllb, T2DM, HTN, hypothyroid , anxiety, vascular dementia, history of hemorrhoidal Bleed 12/2023, being admitted with an MRSA urinary tract infection seen on repeat culture after she failed to improve with interrupted Cipro  outpatient treatment in the setting of a month of persistent dysuria, poor oral intake and generalized malaise She is found to have complicated urinary tract infection with MRSA.  Was treated with oral Zyvox.  Condition had improved.  She had completed 3 days dose of Zyvox, and a dose of fosfomycin 11/14.  She was discharged yesterday, but before discharge, she felt a little dizzy.  She was held for another day  today.  She feels better, medically stable for discharge.  Assessment and Plan:  * Complicated urinary tract infection, MRSA Multiple antibiotic allergies Mixed stress and urge incontinence Patient with a months of dysuria, decreased oral intake and malaise, failing Cipro  x 2 Patient was started on Zyvox after admission to the hospital, repeat urine culture has a low counts of Staph aureus, final culture result still pending. Blood culture negative.  Will continue current regimen. Patient also has urge and stress incontinence, placed on Ditropan . Patient condition much improved today, medically stable for discharge.   Repeat urine culture grew MRSA.  Patient had so far completed 3 days of Zyvox, also received 1 dose of fosfomycin.  Treatment completed.    stage 3b chronic kidney disease (HCC) Acute kidney injury ruled out. Mild metabolic acidosis. Hyperkalemia. Reviewed patient records, patient does not have acute kidney injury.  Renal function still relatively stable.   Patient developed hyperkalemia with potassium 5.6, he was given 1 dose of Lokelma , potassium had dropped down to 4.8.  Elevated potassium is due to chronic kidney disease.  Repeated BMP today still showed normal potassium.   Patient potassium gradually going up to 5.1 today, received another dose of Lokelma . I have prescribed 5 g, twice a week.  Need to monitor BMP.  Also follow-up with Dr. Dennise in nephrology in 2 weeks.   Uncontrolled type 2 diabetes with hyperglycemia. Hypoglycemia. Patient glucose has been labile, occasional hypoglycemia still.  Feel that lower dose of Lantus  will be more appropriate.  As a result, patient may resumed her home regimen which is lower than current  hospital dose.   Paroxysmal atrial fibrillation (HCC) History of TIA Continue anticoagulation.   (HFpEF) heart failure with preserved ejection fraction (HCC) Chronic hypotension on midodrine  No evidence of exacerbation    Hypothyroidism Continue levothyroxine    CAD (coronary artery disease) No complaints of chest pain and EKG is nonacute   Sinus node dysfunction s/p permanent cardiac pacemaker No acute issues suspected   Thrombocytopenia. Resolved      Consultants: None Procedures performed: None  Disposition: Assisted living Diet recommendation:  Discharge Diet Orders (From admission, onward)     Start     Ordered   07/26/24 0000  Diet - low sodium heart healthy        07/26/24 1441   07/26/24 0000  Diet Carb Modified        07/26/24 1441           Cardiac diet DISCHARGE MEDICATION: Allergies as of 07/29/2024       Reactions   Fish Allergy Shortness Of Breath   All kinds of fish. Severe vomitting even with smell of fish.    Macrolides And Ketolides Other (See Comments)   Severe stomach pain (mycins)   Meperidine Shortness Of Breath, Nausea Only, Other (See Comments)   Stomach pain    Other Nausea Only, Swelling, Rash, Anaphylaxis, Diarrhea, Shortness Of Breath, Other (See Comments)   Allergen: NON-STEROIDS, bextra - hands and feet swell Reaction:  Stomach pain   Other Reaction: Intolerance   Prednisone Anaphylaxis, Other (See Comments)   (facial swelling/redness/burning)Increases pts blood sugar  Pt states that she is allergic to all steroids.     Shellfish Allergy Anaphylaxis, Shortness Of Breath, Diarrhea, Nausea Only, Rash   Stomach pain    Sulfa Antibiotics Shortness Of Breath, Diarrhea, Nausea Only, Other (See Comments)   Reaction:  Stomach pain    Sulfacetamide Sodium Diarrhea, Nausea Only, Other (See Comments), Shortness Of Breath   Reaction:  Stomach pain    Telbivudine Other (See Comments)   Unknown reaction   Uloric [febuxostat] Anaphylaxis   Locks pt's body up   Aspirin  Rash, Other (See Comments)   Stomach pain (tolerates lower doses)   Celecoxib Other (See Comments)   GI bleed, weakness, and stomach pain.     Cephalexin Diarrhea, Nausea Only, Other (See  Comments)   stomach pain    Codeine Diarrhea, Nausea Only, Other (See Comments)   Reaction:  Stomach pain Pt tolerates morphine     Dilaudid  [hydromorphone  Hcl] Other (See Comments)   Reactions: easy to overdose - blood pressure drops really low   Erythromycin Diarrhea, Nausea Only, Other (See Comments)   Reaction:  Stomach pain    Iodinated Contrast Media Rash, Other (See Comments)   Pt states that she is unable to have because she has chronic kidney disease.     Lipitor [atorvastatin Calcium ] Nausea Only   Reaction: nausea, weakness, pass blood   Oxycodone  Other (See Comments)   Reaction:  Stomach pain    Pseudoephedrine    Aleve [naproxen]    Reaction: severe stomach pain   Atorvastatin Nausea Only, Other (See Comments)   Weakness    Cephalosporins Diarrhea, Nausea Only, Other (See Comments)   Other: abdominal pain Tolerated 1st generation cephalosporin (CEFAZOLIN ) on 06/02/2022 and 01/21/2023 without documented ADRs.    Doxycycline    Stomach pain    Iodine Rash   Motrin [ibuprofen]    Reaction: severe stomach pain   Tape Other (See Comments)   Causes pts skin to tear  Pt  states that she is able to use paper tape.     Valdecoxib Swelling, Other (See Comments)   Pt states that her hands and feet swell.          Medication List     STOP taking these medications    amiodarone  200 MG tablet Commonly known as: PACERONE    Bactrim DS 800-160 MG tablet Generic drug: sulfamethoxazole-trimethoprim   ciprofloxacin  500 MG tablet Commonly known as: CIPRO    FLONASE  SENSIMIST NA   linezolid 600 MG tablet Commonly known as: ZYVOX   Mucinex  Maximum Strength 1200 MG Tb12 Generic drug: Guaifenesin    ondansetron  4 MG disintegrating tablet Commonly known as: ZOFRAN -ODT   polyethylene glycol 17 g packet Commonly known as: MIRALAX  / GLYCOLAX    Polyvinyl Alcohol -Povidone PF 1.4-0.6 % Soln   solifenacin 5 MG tablet Commonly known as: VESICARE       TAKE these  medications    acetaminophen  500 MG tablet Commonly known as: TYLENOL  Take 2 tablets (1,000 mg total) by mouth every 8 (eight) hours.   albuterol  108 (90 Base) MCG/ACT inhaler Commonly known as: VENTOLIN  HFA Inhale 2 puffs into the lungs every 6 (six) hours as needed for wheezing or shortness of breath (cough).   allopurinol  100 MG tablet Commonly known as: ZYLOPRIM  Take 100 mg by mouth in the morning.   alum & mag hydroxide-simeth 200-200-20 MG/5ML suspension Commonly known as: MAALOX/MYLANTA Take 10 mLs by mouth 3 (three) times daily as needed for indigestion or heartburn.   apixaban  2.5 MG Tabs tablet Commonly known as: Eliquis  Take 1 tablet (2.5 mg total) by mouth 2 (two) times daily.   dicyclomine  10 MG capsule Commonly known as: BENTYL  Take 2 capsules (20 mg total) by mouth 3 (three) times daily before meals.   docusate sodium  100 MG capsule Commonly known as: COLACE Take 100 mg by mouth 2 (two) times daily.   famotidine  10 MG tablet Commonly known as: PEPCID  Take 2 tablets (20 mg total) by mouth 2 (two) times daily. What changed:  how much to take when to take this   furosemide  20 MG tablet Commonly known as: LASIX  Take 1 tablet (20 mg total) by mouth daily.   gabapentin  100 MG capsule Commonly known as: NEURONTIN  Take 100 mg by mouth 3 (three) times daily.   ibandronate 150 MG tablet Commonly known as: BONIVA Take 150 mg by mouth every 30 (thirty) days. Take in the morning with a full glass of water , on an empty stomach, and do not take anything else by mouth or lie down for the next 30 min.   Lantus  100 UNIT/ML injection Generic drug: insulin  glargine Inject 0.19 mLs (19 Units total) into the skin at bedtime. What changed:  how much to take when to take this   levothyroxine  75 MCG tablet Commonly known as: SYNTHROID  Take 75 mcg by mouth daily before breakfast.   Lokelma  5 g packet Generic drug: sodium zirconium cyclosilicate  Take 5 g by mouth 2  (two) times a week. Monday and Thursday   magnesium  hydroxide 400 MG/5ML suspension Commonly known as: MILK OF MAGNESIA Take 30 mLs by mouth daily as needed (constipation).   methocarbamol  500 MG tablet Commonly known as: ROBAXIN  Take 1 tablet (500 mg total) by mouth 3 (three) times daily.   midodrine  5 MG tablet Commonly known as: PROAMATINE  Take 0.5 tablets (2.5 mg total) by mouth 2 (two) times daily with a meal. *DO NOT LIE DOWN AFTER TAKING, DO NOT TAKE AFTER EVENING MEAL.  TAKE LAST DOSE OF THE DAY AT LEAST 4HOURS BEFORE BEDTIME* What changed:  how much to take when to take this   multivitamin with minerals Tabs tablet Take 1 tablet by mouth daily.   mupirocin  ointment 2 % Commonly known as: BACTROBAN  Place 1 Application into the nose 2 (two) times daily.   NovoLIN R FlexPen ReliOn 100 UNIT/ML FlexPen Generic drug: Insulin  Regular Human Inject 1-10 Units into the skin 3 (three) times daily with meals. Inject subcutaneously Sig: CHECK SUGARS BEFORE MEAL TIME & TAKE INSULIN  BASED ON SLIDING SCALE: <70=NO INSULIN , EAT A SNACK AND RECHECK SUGAR SOON. 70-120=0U, 121-150=2U, 151-200=4U, 201-250=5U, 251-300=6U, 301-350=8U, 351-400=10U, >400=CALL MD   nystatin  powder Apply 1 Application topically in the morning and at bedtime. (0800 & 2000) Apply topically to groin   oxybutynin  5 MG tablet Commonly known as: DITROPAN  Take 1 tablet (5 mg total) by mouth 2 (two) times daily for 7 days.   pantoprazole  40 MG tablet Commonly known as: PROTONIX  Take 1 tablet (40 mg total) by mouth 2 (two) times daily before a meal.   rOPINIRole  0.5 MG tablet Commonly known as: REQUIP  Take 0.5 mg by mouth at bedtime. (2000)   senna 8.6 MG tablet Commonly known as: SENOKOT Take 1 tablet (8.6 mg total) by mouth at bedtime as needed for constipation.   sertraline  100 MG tablet Commonly known as: ZOLOFT  Take 100 mg by mouth in the morning. (0800)   sucralfate  1 g tablet Commonly known as:  Carafate  Take 1 tablet (1 g total) by mouth 4 (four) times daily.   Vitamin D  50 MCG (2000 UT) Caps Take 2,000 Units by mouth in the morning.        Follow-up Information     Cordella Corning, FNP Follow up on 08/02/2024.   Specialty: Family Medicine Why: Appt w/ Palliative Care: Contact information: [address] [place] [address] [place] [address] [phone]                Discharge Exam: Fredricka Weights   07/23/24 1454  Weight: 70.3 kg   General exam: Appears calm and comfortable  Respiratory system: Clear to auscultation. Respiratory effort normal. Cardiovascular system: S1 & S2 heard, RRR. No JVD, murmurs, rubs, gallops or clicks. No pedal edema. Gastrointestinal system: Abdomen is nondistended, soft and nontender. No organomegaly or masses felt. Normal bowel sounds heard. Central nervous system: Alert and oriented. No focal neurological deficits. Extremities: Symmetric 5 x 5 power. Skin: No rashes, lesions or ulcers Psychiatry: Judgement and insight appear normal. Mood & affect appropriate.    Condition at discharge: good  The results of significant diagnostics from this hospitalization (including imaging, microbiology, ancillary and laboratory) are listed below for reference.   Imaging Studies: CT ABDOMEN PELVIS WO CONTRAST Result Date: 07/23/2024 CLINICAL DATA:  Urinary tract infection. EXAM: CT ABDOMEN AND PELVIS WITHOUT CONTRAST TECHNIQUE: Multidetector CT imaging of the abdomen and pelvis was performed following the standard protocol without IV contrast. RADIATION DOSE REDUCTION: This exam was performed according to the departmental dose-optimization program which includes automated exposure control, adjustment of the mA and/or kV according to patient size and/or use of iterative reconstruction technique. COMPARISON:  05/25/2024 FINDINGS: Lower chest: Linear atelectasis or scarring noted at the lung bases. Hepatobiliary: No suspicious focal abnormality in  the liver on this study without intravenous contrast. Gallbladder is surgically absent. Common bile duct measures 10 mm diameter, similar to prior and likely secondary to prior cholecystectomy. Pancreas: No focal mass lesion. No dilatation of  the main duct. No intraparenchymal cyst. No peripancreatic edema. Spleen: No splenomegaly. No suspicious focal mass lesion. Adrenals/Urinary Tract: No adrenal nodule or mass. Kidneys unremarkable. No evidence for hydroureter. The urinary bladder appears normal for the degree of distention. Stomach/Bowel: Stomach is unremarkable. No gastric wall thickening. No evidence of outlet obstruction. Duodenum is normally positioned as is the ligament of Treitz. No small bowel wall thickening. No small bowel dilatation. The terminal ileum is normal. Nonvisualization of the appendix is consistent with the reported history of appendectomy. No gross colonic mass. No colonic wall thickening. Moderate stool volume evident. Vascular/Lymphatic: There is moderate atherosclerotic calcification of the abdominal aorta without aneurysm. There is no gastrohepatic or hepatoduodenal ligament lymphadenopathy. No retroperitoneal or mesenteric lymphadenopathy. No pelvic sidewall lymphadenopathy. Reproductive: There is no adnexal mass. Other: No intraperitoneal free fluid. Musculoskeletal: Fixation hardware noted proximal right femur, incompletely visualized. Lumbosacral fusion hardware evident. Bones are diffusely demineralized. Status post vertebral augmentation T12 and L1. IMPRESSION: 1. No acute findings in the abdomen or pelvis. No hydroureteronephrosis. No urinary stone disease. 2. Moderate stool volume. Imaging features could be compatible with clinical constipation. 3.  Aortic Atherosclerosis (ICD10-I70.0). Electronically Signed   By: Camellia Candle M.D.   On: 07/23/2024 09:28   DG Chest 2 View Result Date: 07/22/2024 EXAM: 2 VIEW(S) XRAY OF THE CHEST 07/22/2024 04:15:31 PM COMPARISON: Chest  x-ray 10/28/2023 CLINICAL HISTORY: cough FINDINGS: LUNGS AND PLEURA: Small branching calcifications are seen in the bilateral upper lobes, similar to prior study. No pulmonary edema. No pleural effusion. No pneumothorax. HEART AND MEDIASTINUM: Left-sided pacemaker is again seen. No acute abnormality of the cardiac and mediastinal silhouettes. BONES AND SOFT TISSUES: There are healed right-sided rib fractures. Cervical spinal fusion plate is present. Lower thoracic vertebroplasty changes are again noted. IMPRESSION: 1. No acute findings. Electronically signed by: Greig Pique MD 07/22/2024 04:35 PM EST RP Workstation: [id number]    Microbiology: Results for orders placed or performed during the hospital encounter of 07/22/24  Resp panel by RT-PCR (RSV, Flu A&B, Covid) Anterior Nasal Swab     Status: None   Collection Time: 07/22/24  4:03 PM   Specimen: Anterior Nasal Swab  Result Value Ref Range Status   SARS Coronavirus 2 by RT PCR NEGATIVE NEGATIVE Final    Comment: (NOTE) SARS-CoV-2 target nucleic acids are NOT DETECTED.  The SARS-CoV-2 RNA is generally detectable in upper respiratory specimens during the acute phase of infection. The lowest concentration of SARS-CoV-2 viral copies this assay can detect is 138 copies/mL. A negative result does not preclude SARS-Cov-2 infection and should not be used as the sole basis for treatment or other patient management decisions. A negative result may occur with  improper specimen collection/handling, submission of specimen other than nasopharyngeal swab, presence of viral mutation(s) within the areas targeted by this assay, and inadequate number of viral copies(<138 copies/mL). A negative result must be combined with clinical observations, patient history, and epidemiological information. The expected result is Negative.  Fact Sheet for Patients:  bloggercourse.com  Fact Sheet for Healthcare Providers:   seriousbroker.it  This test is no t yet approved or cleared by the [place]  FDA and  has been authorized for detection and/or diagnosis of SARS-CoV-2 by FDA under an Emergency Use Authorization (EUA). This EUA will remain  in effect (meaning this test can be used) for the duration of the COVID-19 declaration under Section 564(b)(1) of the Act, 21 U.S.C.section 360bbb-3(b)(1), unless the authorization is terminated  or revoked sooner.  Influenza A by PCR NEGATIVE NEGATIVE Final   Influenza B by PCR NEGATIVE NEGATIVE Final    Comment: (NOTE) The Xpert Xpress SARS-CoV-2/FLU/RSV plus assay is intended as an aid in the diagnosis of influenza from Nasopharyngeal swab specimens and should not be used as a sole basis for treatment. Nasal washings and aspirates are unacceptable for Xpert Xpress SARS-CoV-2/FLU/RSV testing.  Fact Sheet for Patients: bloggercourse.com  Fact Sheet for Healthcare Providers: seriousbroker.it  This test is not yet approved or cleared by the [place]  FDA and has been authorized for detection and/or diagnosis of SARS-CoV-2 by FDA under an Emergency Use Authorization (EUA). This EUA will remain in effect (meaning this test can be used) for the duration of the COVID-19 declaration under Section 564(b)(1) of the Act, 21 U.S.C. section 360bbb-3(b)(1), unless the authorization is terminated or revoked.     Resp Syncytial Virus by PCR NEGATIVE NEGATIVE Final    Comment: (NOTE) Fact Sheet for Patients: bloggercourse.com  Fact Sheet for Healthcare Providers: seriousbroker.it  This test is not yet approved or cleared by the [place]  FDA and has been authorized for detection and/or diagnosis of SARS-CoV-2 by FDA under an Emergency Use Authorization (EUA). This EUA will remain in effect (meaning this test can be used) for  the duration of the COVID-19 declaration under Section 564(b)(1) of the Act, 21 U.S.C. section 360bbb-3(b)(1), unless the authorization is terminated or revoked.  Performed at [facility], [address] [place], [place] [address]   Blood culture (single)     Status: None   Collection Time: 07/22/24  4:53 PM   Specimen: BLOOD  Result Value Ref Range Status   Specimen Description BLOOD BLOOD LEFT FOREARM  Final   Special Requests   Final    BOTTLES DRAWN AEROBIC AND ANAEROBIC Blood Culture results may not be optimal due to an inadequate volume of blood received in culture bottles   Culture   Final    NO GROWTH 5 DAYS Performed at [facility], [address] [place], [place] [address]    Report Status 07/27/2024 FINAL  Final  Urine Culture     Status: Abnormal   Collection Time: 07/22/24  5:39 PM   Specimen: Urine, Random  Result Value Ref Range Status   Specimen Description   Final    URINE, RANDOM Performed at [facility], [address] [place], [place] [address]    Special Requests   Final    NONE Performed at [facility], [address], [place], [place] [address]    Culture (A)  Final    80,000 COLONIES/mL METHICILLIN RESISTANT STAPHYLOCOCCUS AUREUS   Report Status 07/25/2024 FINAL  Final   Organism ID, Bacteria METHICILLIN RESISTANT STAPHYLOCOCCUS AUREUS (A)  Final      Susceptibility   Methicillin resistant staphylococcus aureus - MIC*    CIPROFLOXACIN  >=8 RESISTANT Resistant     GENTAMICIN  <=0.5 SENSITIVE Sensitive     NITROFURANTOIN  <=16 SENSITIVE Sensitive     OXACILLIN >=4 RESISTANT Resistant     TETRACYCLINE <=1 SENSITIVE Sensitive     VANCOMYCIN  1 SENSITIVE Sensitive     TRIMETH/SULFA <=10 SENSITIVE Sensitive     RIFAMPIN <=0.5 SENSITIVE Sensitive     LINEZOLID 2 SENSITIVE Sensitive     * 80,000 COLONIES/mL METHICILLIN RESISTANT STAPHYLOCOCCUS AUREUS  MRSA Next Gen by PCR, Nasal     Status: Abnormal    Collection Time: 07/24/24  2:59 PM   Specimen: Nasal Mucosa; Nasal Swab  Result  Value Ref Range Status   MRSA by PCR Next Gen DETECTED (A) NOT DETECTED Final    Comment: RESULT CALLED TO, READ BACK BY AND VERIFIED WITH: EVERJOY BEMBERE 07/24/24 1947 MU (NOTE) The GeneXpert MRSA Assay (FDA approved for NASAL specimens only), is one component of a comprehensive MRSA colonization surveillance program. It is not intended to diagnose MRSA infection nor to guide or monitor treatment for MRSA infections. Test performance is not FDA approved in patients less than 25 years old. Performed at [facility], [address] [place], [place] [address]     Labs: CBC: Recent Labs  Lab 07/23/24 [phone] 07/25/24 0618 07/27/24 0601  WBC 3.6* 4.4 4.2  HGB 11.1* 12.2 11.7*  HCT 34.2* 36.9 35.0*  MCV 97.2 97.1 96.4  PLT 134* 173 153   Basic Metabolic Panel: Recent Labs  Lab 07/23/24 0428 07/25/24 0618 07/26/24 0758 07/26/24 1322 07/27/24 0601 07/29/24 0337  NA 138 136 134*  --  139 139  K 4.5 5.5* 5.6* 4.8 4.9 5.1  CL 106 101 101  --  104 105  CO2 21* 26 25  --  27 27  GLUCOSE 215* 174* 263*  --  137* 161*  BUN 46* 38* 41*  --  41* 35*  CREATININE 1.48* 1.36* 1.36*  --  1.36* 1.29*  CALCIUM  8.6* 9.1 9.0  --  9.0 9.2  MG  --  1.8  --   --  1.9  --    Liver Function Tests: No results for input(s): AST, ALT, ALKPHOS, BILITOT, PROT, ALBUMIN  in the last 168 hours. CBG: Recent Labs  Lab 07/28/24 1139 07/28/24 1653 07/28/24 2106 07/29/24 0824 07/29/24 1119  GLUCAP 208* 118* 248* 101* 253*    Discharge time spent: 35 minutes.  Signed: Murvin Mana, MD [facility] 07/29/2024

## 2024-07-29 NOTE — TOC Progression Note (Addendum)
 Transition of Care [facility]) - Progression Note    Patient Details  Name: Deborah Obrien MRN: [id number] Date of Birth: Feb 03, 1941  Transition of Care [facility]) CM/SW Contact  Tedd Cottrill L Jalana Moore, [place] Phone Number: 07/29/2024, 12:55 PM  Clinical Narrative:      CSW made multiple attempts to contact an administrator at [facility] to no avail. Voicemail message left requesting a return call.   1:03pm: CSW got in touch with someone in sales and marketing. CSW advised that it is consistently difficult getting in touch with admissions. CSW advised that patient is medically ready for discharge. CSW advised that the Admissions Director has been notified.                      Expected Discharge Plan and Services         Expected Discharge Date: 07/27/24                                     Social Drivers of Health (SDOH) Interventions SDOH Screenings   Food Insecurity: No Food Insecurity (07/23/2024)  Housing: Unknown (07/28/2024)  Transportation Needs: No Transportation Needs (07/23/2024)  Utilities: Not At Risk (07/23/2024)  Financial Resource Strain: Low Risk  (05/23/2024)   Received from [facility]  Physical Activity: Unknown (02/19/2019)   Received from [facility]  Social Connections: Socially Integrated (07/23/2024)  Stress: Stress Concern Present (02/19/2019)   Received from [facility]  Tobacco Use: Low Risk  (07/22/2024)    Readmission Risk Interventions    05/27/2024   10:43 AM 01/10/2024    9:49 AM 10/30/2023    4:25 PM  Readmission Risk Prevention Plan  Transportation Screening Complete  Complete  Medication Review (RN Care Manager)   Complete  PCP or Specialist appointment within 3-5 days of discharge Complete Complete Complete  HRI or Home Care Consult Complete Complete Complete  SW Recovery Care/Counseling Consult Complete Complete   Palliative Care Screening Not Applicable Not Applicable  Complete  Skilled Nursing Facility Not Applicable Not Applicable Complete

## 2024-07-29 NOTE — TOC Transition Note (Signed)
 Transition of Care [facility]) - Discharge Note   Patient Details  Name: Deborah Obrien MRN: [id number] Date of Birth: [birth year]  Transition of Care [facility]) CM/SW Contact:  Ashutosh Dieguez L Zandrea Kenealy, LCSW Phone Number: 07/29/2024, 2:51 PM   Clinical Narrative:      Patient medically stable for discharge. [facility] contacted. [facility] will provide transportation. Son, Franky, was notified.   No further TOC needs. TOC signing off.        Patient Goals and CMS Choice            Discharge Placement                       Discharge Plan and Services Additional resources added to the After Visit Summary for                                       Social Drivers of Health (SDOH) Interventions SDOH Screenings   Food Insecurity: No Food Insecurity (07/23/2024)  Housing: Unknown (07/28/2024)  Transportation Needs: No Transportation Needs (07/23/2024)  Utilities: Not At Risk (07/23/2024)  Financial Resource Strain: Low Risk  (05/23/2024)   Received from [facility]  Physical Activity: Unknown (02/19/2019)   Received from [facility]  Social Connections: Socially Integrated (07/23/2024)  Stress: Stress Concern Present (02/19/2019)   Received from [facility]  Tobacco Use: Low Risk  (07/22/2024)     Readmission Risk Interventions    05/27/2024   10:43 AM 01/10/2024    9:49 AM 10/30/2023    4:25 PM  Readmission Risk Prevention Plan  Transportation Screening Complete  Complete  Medication Review (RN Care Manager)   Complete  PCP or Specialist appointment within 3-5 days of discharge Complete Complete Complete  HRI or Home Care Consult Complete Complete Complete  SW Recovery Care/Counseling Consult Complete Complete   Palliative Care Screening Not Applicable Not Applicable Complete  Skilled Nursing Facility Not Applicable Not Applicable Complete

## 2024-08-06 ENCOUNTER — Other Ambulatory Visit: Payer: Self-pay

## 2024-08-06 ENCOUNTER — Emergency Department

## 2024-08-06 ENCOUNTER — Observation Stay
Admission: EM | Admit: 2024-08-06 | Discharge: 2024-08-07 | Disposition: A | Discharge status: Other Acute Inpatient Hospital

## 2024-08-06 DIAGNOSIS — Z794 Long term (current) use of insulin: Secondary | ICD-10-CM | POA: Diagnosis not present

## 2024-08-06 DIAGNOSIS — F418 Other specified anxiety disorders: Secondary | ICD-10-CM | POA: Diagnosis present

## 2024-08-06 DIAGNOSIS — Z79899 Other long term (current) drug therapy: Secondary | ICD-10-CM | POA: Diagnosis not present

## 2024-08-06 DIAGNOSIS — Z8739 Personal history of other diseases of the musculoskeletal system and connective tissue: Secondary | ICD-10-CM

## 2024-08-06 DIAGNOSIS — F419 Anxiety disorder, unspecified: Secondary | ICD-10-CM | POA: Diagnosis not present

## 2024-08-06 DIAGNOSIS — T7840XA Allergy, unspecified, initial encounter: Principal | ICD-10-CM | POA: Insufficient documentation

## 2024-08-06 DIAGNOSIS — Z7901 Long term (current) use of anticoagulants: Secondary | ICD-10-CM | POA: Insufficient documentation

## 2024-08-06 DIAGNOSIS — M109 Gout, unspecified: Secondary | ICD-10-CM | POA: Diagnosis not present

## 2024-08-06 DIAGNOSIS — R0602 Shortness of breath: Secondary | ICD-10-CM | POA: Diagnosis present

## 2024-08-06 DIAGNOSIS — F32A Depression, unspecified: Secondary | ICD-10-CM | POA: Diagnosis not present

## 2024-08-06 DIAGNOSIS — G2581 Restless legs syndrome: Secondary | ICD-10-CM | POA: Diagnosis present

## 2024-08-06 DIAGNOSIS — E1122 Type 2 diabetes mellitus with diabetic chronic kidney disease: Secondary | ICD-10-CM | POA: Diagnosis not present

## 2024-08-06 DIAGNOSIS — R21 Rash and other nonspecific skin eruption: Secondary | ICD-10-CM | POA: Diagnosis not present

## 2024-08-06 DIAGNOSIS — X58XXXA Exposure to other specified factors, initial encounter: Secondary | ICD-10-CM | POA: Insufficient documentation

## 2024-08-06 DIAGNOSIS — I480 Paroxysmal atrial fibrillation: Secondary | ICD-10-CM | POA: Diagnosis present

## 2024-08-06 DIAGNOSIS — E876 Hypokalemia: Secondary | ICD-10-CM | POA: Diagnosis not present

## 2024-08-06 DIAGNOSIS — N1832 Chronic kidney disease, stage 3b: Secondary | ICD-10-CM | POA: Diagnosis present

## 2024-08-06 DIAGNOSIS — I130 Hypertensive heart and chronic kidney disease with heart failure and stage 1 through stage 4 chronic kidney disease, or unspecified chronic kidney disease: Secondary | ICD-10-CM | POA: Diagnosis not present

## 2024-08-06 DIAGNOSIS — Z79890 Hormone replacement therapy (postmenopausal): Secondary | ICD-10-CM | POA: Insufficient documentation

## 2024-08-06 DIAGNOSIS — Z8673 Personal history of transient ischemic attack (TIA), and cerebral infarction without residual deficits: Secondary | ICD-10-CM | POA: Insufficient documentation

## 2024-08-06 DIAGNOSIS — I4821 Permanent atrial fibrillation: Secondary | ICD-10-CM

## 2024-08-06 DIAGNOSIS — I5032 Chronic diastolic (congestive) heart failure: Secondary | ICD-10-CM | POA: Diagnosis not present

## 2024-08-06 DIAGNOSIS — E1129 Type 2 diabetes mellitus with other diabetic kidney complication: Secondary | ICD-10-CM | POA: Diagnosis present

## 2024-08-06 DIAGNOSIS — I5030 Unspecified diastolic (congestive) heart failure: Secondary | ICD-10-CM | POA: Diagnosis present

## 2024-08-06 DIAGNOSIS — R1310 Dysphagia, unspecified: Secondary | ICD-10-CM | POA: Insufficient documentation

## 2024-08-06 DIAGNOSIS — I5A Non-ischemic myocardial injury (non-traumatic): Secondary | ICD-10-CM | POA: Diagnosis present

## 2024-08-06 DIAGNOSIS — R079 Chest pain, unspecified: Secondary | ICD-10-CM

## 2024-08-06 DIAGNOSIS — E039 Hypothyroidism, unspecified: Secondary | ICD-10-CM | POA: Diagnosis present

## 2024-08-06 DIAGNOSIS — I2510 Atherosclerotic heart disease of native coronary artery without angina pectoris: Secondary | ICD-10-CM | POA: Diagnosis not present

## 2024-08-06 LAB — CBC
HCT: 35.6 % — ABNORMAL LOW (ref 36.0–46.0)
Hemoglobin: 11.8 g/dL — ABNORMAL LOW (ref 12.0–15.0)
MCH: 32.2 pg (ref 26.0–34.0)
MCHC: 33.1 g/dL (ref 30.0–36.0)
MCV: 97.3 fL (ref 80.0–100.0)
Platelets: 193 K/uL (ref 150–400)
RBC: 3.66 MIL/uL — ABNORMAL LOW (ref 3.87–5.11)
RDW: 13 % (ref 11.5–15.5)
WBC: 8.2 K/uL (ref 4.0–10.5)
nRBC: 0 % (ref 0.0–0.2)

## 2024-08-06 LAB — BASIC METABOLIC PANEL WITH GFR
Anion gap: 17 — ABNORMAL HIGH (ref 5–15)
BUN: 32 mg/dL — ABNORMAL HIGH (ref 8–23)
CO2: 22 mmol/L (ref 22–32)
Calcium: 9.6 mg/dL (ref 8.9–10.3)
Chloride: 101 mmol/L (ref 98–111)
Creatinine, Ser: 1.38 mg/dL — ABNORMAL HIGH (ref 0.44–1.00)
GFR, Estimated: 38 mL/min — ABNORMAL LOW (ref >60)
Glucose, Bld: 142 mg/dL — ABNORMAL HIGH (ref 70–99)
Potassium: 3.2 mmol/L — ABNORMAL LOW (ref 3.5–5.1)
Sodium: 139 mmol/L (ref 135–145)

## 2024-08-06 LAB — URINALYSIS, ROUTINE W REFLEX MICROSCOPIC
Bilirubin Urine: NEGATIVE
Glucose, UA: NEGATIVE mg/dL
Hgb urine dipstick: NEGATIVE
Ketones, ur: NEGATIVE mg/dL
Leukocytes,Ua: NEGATIVE
Nitrite: NEGATIVE
Protein, ur: NEGATIVE mg/dL
Specific Gravity, Urine: 1.014 (ref 1.005–1.030)
pH: 7 (ref 5.0–8.0)

## 2024-08-06 LAB — CBG MONITORING, ED
Glucose-Capillary: 50 mg/dL — ABNORMAL LOW (ref 70–99)
Glucose-Capillary: 200 mg/dL — ABNORMAL HIGH (ref 70–99)

## 2024-08-06 LAB — TROPONIN T, HIGH SENSITIVITY: Troponin T High Sensitivity: 32 ng/L — ABNORMAL HIGH (ref 0–19)

## 2024-08-06 MED ORDER — FENTANYL CITRATE (PF) 50 MCG/ML IJ SOSY
25.0000 ug | PREFILLED_SYRINGE | Freq: Once | INTRAMUSCULAR | Status: AC
  Administered 2024-08-06: 25 ug via INTRAMUSCULAR
  Filled 2024-08-06: qty 1

## 2024-08-06 MED ORDER — PANTOPRAZOLE SODIUM 40 MG PO TBEC
40.0000 mg | DELAYED_RELEASE_TABLET | Freq: Two times a day (BID) | ORAL | Status: DC
  Administered 2024-08-07: 40 mg via ORAL
  Filled 2024-08-06: qty 1

## 2024-08-06 MED ORDER — APIXABAN 2.5 MG PO TABS
2.5000 mg | ORAL_TABLET | Freq: Two times a day (BID) | ORAL | Status: DC
  Administered 2024-08-07: 2.5 mg via ORAL
  Filled 2024-08-06: qty 1

## 2024-08-06 MED ORDER — DICYCLOMINE HCL 10 MG PO CAPS
20.0000 mg | ORAL_CAPSULE | Freq: Three times a day (TID) | ORAL | Status: DC
  Administered 2024-08-07 (×2): 20 mg via ORAL
  Filled 2024-08-06 (×2): qty 2

## 2024-08-06 MED ORDER — SODIUM CHLORIDE 0.9 % IV SOLN: INTRAVENOUS | Status: DC

## 2024-08-06 MED ORDER — GABAPENTIN 100 MG PO CAPS
100.0000 mg | ORAL_CAPSULE | Freq: Three times a day (TID) | ORAL | Status: DC
  Administered 2024-08-07 (×2): 100 mg via ORAL
  Filled 2024-08-06 (×2): qty 1

## 2024-08-06 MED ORDER — ROPINIROLE HCL 0.25 MG PO TABS
0.5000 mg | ORAL_TABLET | Freq: Every day | ORAL | Status: DC
  Administered 2024-08-07: 0.5 mg via ORAL
  Filled 2024-08-06: qty 2

## 2024-08-06 MED ORDER — SODIUM CHLORIDE 0.9 % IV BOLUS
500.0000 mL | Freq: Once | INTRAVENOUS | Status: AC
  Administered 2024-08-06: 500 mL via INTRAVENOUS

## 2024-08-06 MED ORDER — ADULT MULTIVITAMIN W/MINERALS CH
1.0000 | ORAL_TABLET | Freq: Every day | ORAL | Status: DC
  Administered 2024-08-07: 1 via ORAL
  Filled 2024-08-06: qty 1

## 2024-08-06 MED ORDER — DIPHENHYDRAMINE HCL 50 MG/ML IJ SOLN
12.5000 mg | Freq: Three times a day (TID) | INTRAMUSCULAR | Status: DC
  Administered 2024-08-06 – 2024-08-07 (×2): 12.5 mg via INTRAVENOUS
  Filled 2024-08-06 (×2): qty 1

## 2024-08-06 MED ORDER — DM-GUAIFENESIN ER 30-600 MG PO TB12
1.0000 | ORAL_TABLET | Freq: Two times a day (BID) | ORAL | Status: DC | PRN
Start: 2024-08-06 — End: 2024-08-07

## 2024-08-06 MED ORDER — FAMOTIDINE 20 MG PO TABS
20.0000 mg | ORAL_TABLET | Freq: Two times a day (BID) | ORAL | Status: DC
  Administered 2024-08-07: 20 mg via ORAL
  Filled 2024-08-06: qty 1

## 2024-08-06 MED ORDER — DIPHENHYDRAMINE HCL 25 MG PO CAPS
25.0000 mg | ORAL_CAPSULE | Freq: Once | ORAL | Status: AC
  Administered 2024-08-06: 25 mg via ORAL
  Filled 2024-08-06: qty 1

## 2024-08-06 MED ORDER — SERTRALINE HCL 50 MG PO TABS
100.0000 mg | ORAL_TABLET | Freq: Every day | ORAL | Status: DC
  Administered 2024-08-07: 100 mg via ORAL
  Filled 2024-08-06: qty 2

## 2024-08-06 MED ORDER — POTASSIUM CHLORIDE CRYS ER 20 MEQ PO TBCR
20.0000 meq | EXTENDED_RELEASE_TABLET | Freq: Once | ORAL | Status: AC
  Administered 2024-08-07: 20 meq via ORAL
  Filled 2024-08-06: qty 1

## 2024-08-06 MED ORDER — INSULIN GLARGINE-YFGN 100 UNIT/ML ~~LOC~~ SOLN
14.0000 [IU] | Freq: Every day | SUBCUTANEOUS | Status: DC
  Filled 2024-08-06: qty 0.14

## 2024-08-06 MED ORDER — EPINEPHRINE 0.3 MG/0.3ML IJ SOAJ
0.3000 mg | Freq: Once | INTRAMUSCULAR | Status: AC
  Administered 2024-08-06: 0.3 mg via INTRAMUSCULAR
  Filled 2024-08-06: qty 0.3

## 2024-08-06 MED ORDER — ONDANSETRON HCL 4 MG/2ML IJ SOLN: 4.0000 mg | Freq: Three times a day (TID) | INTRAMUSCULAR | Status: DC | PRN

## 2024-08-06 MED ORDER — ACETAMINOPHEN 325 MG PO TABS: 650.0000 mg | ORAL_TABLET | Freq: Four times a day (QID) | ORAL | Status: DC | PRN

## 2024-08-06 MED ORDER — ALBUTEROL SULFATE HFA 108 (90 BASE) MCG/ACT IN AERS: 2.0000 | INHALATION_SPRAY | RESPIRATORY_TRACT | Status: DC | PRN

## 2024-08-06 MED ORDER — LEVOTHYROXINE SODIUM 50 MCG PO TABS
75.0000 ug | ORAL_TABLET | Freq: Every day | ORAL | Status: DC
  Administered 2024-08-07: 75 ug via ORAL
  Filled 2024-08-06: qty 2

## 2024-08-06 MED ORDER — FAMOTIDINE 20 MG PO TABS
20.0000 mg | ORAL_TABLET | Freq: Once | ORAL | Status: AC
  Administered 2024-08-06: 20 mg via ORAL
  Filled 2024-08-06: qty 1

## 2024-08-06 MED ORDER — ALLOPURINOL 100 MG PO TABS
100.0000 mg | ORAL_TABLET | Freq: Every day | ORAL | Status: DC
  Administered 2024-08-07: 100 mg via ORAL
  Filled 2024-08-06: qty 1

## 2024-08-06 MED ORDER — HYDRALAZINE HCL 20 MG/ML IJ SOLN
5.0000 mg | INTRAMUSCULAR | Status: DC | PRN
Start: 2024-08-06 — End: 2024-08-07

## 2024-08-06 NOTE — ED Provider Notes (Signed)
 [facility] Provider Note    Event Date/Time   First MD Initiated Contact with Patient 08/06/24 1809     (approximate)   History   Rash    HPI  Deborah Obrien is a 83 y.o. female    with a past medical history of acute cystitis without hematuria, GERD, paroxysmal atrial fibrillation, type 2 diabetes, chronic kidney disease stage III,who presents to the ED complaining of rash. According to the patient, symptoms started 2 days ago with rash on her arms and legs, changes in her voice.  Patient endorses she was admitted 2 weeks ago here for a UTI, at discharge they changed her medications and she thinks she is having an allergic reaction to her new medications.  She endorses having shortness of breath.  Patient denies chest pain, abdominal pain, diarrhea, urinary symptoms.  Patient is here by herself.   Patient endorses 2 of her children do not live in [place] .  Other son lives in [place]  but is unable to come here.  Patient Active Problem List   Diagnosis Date Noted   Allergic reaction 08/06/2024   Uncontrolled type 2 diabetes mellitus with hyperglycemia, with long-term current use of insulin  (HCC) 07/24/2024   Complicated UTI (urinary tract infection) 07/24/2024   Complicated urinary tract infection, MRSA 07/22/2024   Inadequate oral intake 07/22/2024   Gastritis with hemorrhage 05/29/2024   CKD stage 3b, GFR 30-44 ml/min (HCC) 05/26/2024   Intractable nausea and vomiting 05/25/2024   Elevated LFTs 05/25/2024   Depression 05/25/2024   Type 2 diabetes mellitus without complications (HCC) 05/25/2024   Polyp of transverse colon 01/10/2024   Blood in stool 01/09/2024   BRBPR (bright red blood per rectum) 01/08/2024   Gastroenteritis 01/08/2024   Hemorrhagic shock (HCC) 10/31/2023   RSV (respiratory syncytial virus pneumonia) 10/31/2023   Hyperkalemia 10/31/2023   Hyponatremia 10/31/2023   Hematoma of right hip 10/28/2023   Acute  respiratory failure with hypoxia (HCC) 02/01/2023   Paroxysmal atrial fibrillation (HCC) 01/24/2023   Pulmonary hypertension (HCC) 01/22/2023   Metabolic acidosis 01/22/2023   Fall 01/22/2023   Transaminitis 01/21/2023   Hypotension after procedure 01/21/2023   Shock (HCC) 01/21/2023   Closed fracture of right hip (HCC) 01/21/2023   Nausea 01/21/2023   (HFpEF) heart failure with preserved ejection fraction (HCC) 01/01/2023   Restless leg syndrome 05/31/2022   Hypothyroidism 05/30/2022   Acute renal failure superimposed on stage 3b chronic kidney disease (HCC) 05/30/2022   Drop in hemoglobin 05/30/2022   Elevated liver function tests 05/30/2022   History of gout 05/30/2022   Humerus fracture 05/29/2022   Abnormal urinalysis 03/05/2022   Hypoglycemia, with long-term current use of insulin  (HCC) 03/04/2022   Hypoglycemia 03/04/2022   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/02/2022   Anemia in chronic kidney disease 03/02/2022   Congestive heart failure (HCC) 03/02/2022   Osteoporosis 02/13/2020   Primary osteoarthritis of left shoulder 11/10/2019   Uses walker 09/27/2018   UTI (urinary tract infection) 06/14/2018   Type 2 diabetes mellitus with renal manifestations (HCC) 05/02/2018   Chronic anticoagulation 04/09/2018   Sinus node dysfunction s/p permanent cardiac pacemaker 04/09/2018   H/O TIA (transient ischemic attack) and stroke 04/09/2018   CAD (coronary artery disease) 04/09/2018   Hypotension 02/04/2018   Sinus node dysfunction (HCC) 08/24/2017   Dizziness 06/12/2017   Nephrolithiasis 02/22/2016   Diabetic neuropathy with neurologic complication (HCC) 11/30/2015   Recurrent major depressive disorder, in remission  08/11/2015   Polyp of stomach and duodenum 07/29/2015   Gastric polyp    Gastro-esophageal reflux disease without esophagitis    Epigastric pain    Permanent atrial fibrillation (HCC) 06/29/2015   Intestinal obstruction (HCC)  02/20/2015   DDD (degenerative disc disease), lumbar 01/19/2015   Back pain 01/12/2015   Gout 01/12/2015   Intervertebral disc disorder with radiculopathy of lumbar region 09/17/2014   Lumbar radiculitis 09/17/2014   Lumbar stenosis with neurogenic claudication 09/17/2014   Neuritis or radiculitis due to rupture of lumbar intervertebral disc 09/17/2014   Atherosclerosis of both carotid arteries 07/15/2014   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   Bilateral carotid artery disease 07/15/2014   Body mass index (BMI) of 40.0-44.9 in adult [facility]) 06/21/2014   Mixed urge and stress incontinence 06/25/2013   Nocturia 06/25/2013   Blurring of visual image 04/11/2012   Other visual disturbances 04/11/2012     Physical Exam   Triage Vital Signs: ED Triage Vitals  Encounter Vitals Group     BP 08/06/24 1742 (!) 130/91     Girls Systolic BP Percentile --      Girls Diastolic BP Percentile --      Boys Systolic BP Percentile --      Boys Diastolic BP Percentile --      Pulse Rate 08/06/24 1741 76     Resp 08/06/24 1741 20     Temp 08/06/24 1741 97.9 F (36.6 C)     Temp Source 08/06/24 1741 Oral     SpO2 08/06/24 1741 95 %     Weight --      Height --      Head Circumference --      Peak Flow --      Pain Score 08/06/24 1741 0     Pain Loc --      Pain Education --      Exclude from Growth Chart --     Most recent vital signs: Vitals:   08/06/24 2245 08/06/24 2300  BP:    Pulse: 63 62  Resp: 17 18  Temp:    SpO2: 100% 99%     Physical Exam Vitals and nursing note reviewed.  During triage patient was hypertensive  General:          Awake, no distress.  Shortness of breath when she is getting up from bed.  Patient was crying during physical exam because his family cannot come to be with her. Head: Atraumatic Mouth: Dry mucous CV:                  Good peripheral perfusion.  Patient has a pacemaker Resp:               Normal effort. no tachypnea, no wheezing Abd:                  No distention.  Soft nontender Other:              Upper and lower extremities presence of erythema at the level of the medial side of the cubital fossa and medial side of the tights.  Patient is scratching her legs during physical exam. ED Results / Procedures / Treatments   Labs (all labs ordered are listed, but only abnormal results are displayed) Labs Reviewed  URINALYSIS, ROUTINE W REFLEX MICROSCOPIC - Abnormal; Notable for the following components:      Result Value   Color, Urine YELLOW (*)    APPearance CLEAR (*)  All other components within normal limits  BASIC METABOLIC PANEL WITH GFR - Abnormal; Notable for the following components:   Potassium 3.2 (*)    Glucose, Bld 142 (*)    BUN 32 (*)    Creatinine, Ser 1.38 (*)    GFR, Estimated 38 (*)    Anion gap 17 (*)    All other components within normal limits  CBC - Abnormal; Notable for the following components:   RBC 3.66 (*)    Hemoglobin 11.8 (*)    HCT 35.6 (*)    All other components within normal limits  CBG MONITORING, ED - Abnormal; Notable for the following components:   Glucose-Capillary 50 (*)    All other components within normal limits  CBG MONITORING, ED - Abnormal; Notable for the following components:   Glucose-Capillary 200 (*)    All other components within normal limits  TROPONIN T, HIGH SENSITIVITY - Abnormal; Notable for the following components:   Troponin T High Sensitivity 32 (*)    All other components within normal limits  TROPONIN T, HIGH SENSITIVITY     EKG See physician read    RADIOLOGY I independently reviewed and interpreted imaging and agree with radiologists findings.      PROCEDURES:  Critical Care performed:   Procedures   MEDICATIONS ORDERED IN ED: Medications  potassium chloride  SA (KLOR-CON  M) CR tablet 20 mEq (has no administration in time range)  diphenhydrAMINE  (BENADRYL ) injection 12.5 mg (has no administration in time range)  famotidine   (PEPCID ) tablet 20 mg (has no administration in time range)  famotidine  (PEPCID ) tablet 20 mg (20 mg Oral Given 08/06/24 1919)  EPINEPHrine  (EPI-PEN) injection 0.3 mg (0.3 mg Intramuscular Given 08/06/24 1939)  diphenhydrAMINE  (BENADRYL ) capsule 25 mg (25 mg Oral Given 08/06/24 1919)  fentaNYL  (SUBLIMAZE ) injection 25 mcg (25 mcg Intramuscular Given 08/06/24 1956)  sodium chloride  0.9 % bolus 500 mL (500 mLs Intravenous New Bag/Given 08/06/24 2136)   Clinical Course as of 08/06/24 2304  Tue Aug 06, 2024  1914 Patient evaluated at request of treating APP.  Did recently have some medication changes after being discharged from the hospital.  Presents with about 2 days of diffuse urticarial rash, itching, subjective shortness of breath, sensation of mild throat tightness.  Does meet anaphylaxis criteria though fortunately overall well-appearing at this time.  Will trial epinephrine , famotidine , Benadryl  to see how she responds.  Is allergic to all steroids per her chart.  If not responding, consider drug rash [MM]  2032 At 7 PM consulted patient with Dr. Clarine, who personally evaluated the patient. [AE]  2047 Reassessed the patient, patient was complaining about numbness on her toes.  Glucose is 200.   [AE]  2048 CBC(!) Anemia, hemoglobin 11.8.  Platelets within normal limits [AE]  2051 DG Chest Port 1 View No acute cardiopulmonary process to explain shortness of breath. 2. Emphysematous changes in the lungs.   [AE]  2148 Urinalysis, Routine w reflex microscopic -Urine, Clean Catch(!) Negative for UTI [AE]  2148 Basic metabolic panel(!) Hypokalemia, potassium 3.2.  BUN and creatinine elevated, anion gap elevated, GFR decreased to 38.  I will order potassium p.o. [AE]  2149 Troponin T, High Sensitivity(!) Troponin elevated [AE]  2212 Reassessed the patient, patient is complaining about bilateral leg pain due to her leg restless syndrome.  Updated patient with results of labs.  On a physical  exam erythema is extending to the left forearm and left calf.  Dr. Clarine evaluated the patient.  Patient  is going to be admitted, patient is agreeable with the plan.  I will consult hospitalist. [AE]  2301 Consulted Dr. Hilma Rankins who is going to admit the patient. [AE]    Clinical Course User Index [AE] Janit Kast, PA-C [MM] Clarine Ozell LABOR, MD    IMPRESSION / MDM / ASSESSMENT AND PLAN / ED COURSE  I reviewed the triage vital signs and the nursing notes.  Differential diagnosis includes, but is not limited to, medication allergy reaction, electrolyte derangement, heart failure decompensated, UTI, pneumonia.  Patient's presentation is most consistent with acute complicated illness / injury requiring diagnostic workup.   Deborah Obrien is a 83 y.o., female who presents today with history of 2 days of rash in arms and legs, associated to pruritus, changes in her voice, increase of shortness of breath.  On a physical exam, during triage patient was hypertensive, saturations 95%.  When I arrived to do physical exam nurses were checking glucose, glucose level was 50.  Offered to the patient orange juice and crackers.  There is presence of erythema in the bilateral cubital fossa, medial side of thighs.  When I asked the patient to get up from bed to check her skin patient was having shortness of breath.  Cardiopulmonary was negative for wheezing.  Lower extremities, no tenderness to palpation of the calf.  Rest of physical exam was normal.  Plan CBC, CMP, UA EKG Chest x-ray Troponin Famotidine  Consult Dr. Clarine  I did consult Dr. Clarine who evaluated the patient.  Patient received epinephrine , Benadryl , famotidine , fentanyl .  After getting the epinephrine  patient presented episode of chest pain, difficulty swallowing.  EKG was ordered, did not show signs of MI.  Dr. Clarine under ultrasound was able to cannulate the vein.  Patient recovered. Reassessed the patient, who complained about numbness  on her toes, rechecked glucose, CBG of 200.  Patient states rash is extending to her left forearm and left calf.  Patient endorses that she has leg restless syndrome and she cannot sleep due to pain. Updated patient with results of the labs, ask her consent to be admitted.  Patient is agreeable with the plan. Consulted hospitalist, patient is going to be admitted. Discussed plan of care with patient, answered all of patient's questions, Patient agreeable to plan of care.  Patient verbalized understanding.  FINAL CLINICAL IMPRESSION(S) / ED DIAGNOSES   Final diagnoses:  Allergic reaction, initial encounter     Rx / DC Orders   ED Discharge Orders     None        Note:  This document was prepared using Dragon voice recognition software and may include unintentional dictation errors.   Janit Kast, PA-C 08/06/24 2306    Clarine Ozell LABOR, MD 08/06/24 2351

## 2024-08-06 NOTE — ED Notes (Signed)
 Responded to call bell. IV with saline was infiltrating into her right AC space. Provider notified. Received verbal orders for IV team consult.

## 2024-08-06 NOTE — ED Triage Notes (Signed)
 Patient to ED via [facility] for rash to bilateral arms and legs, runny nose since yesterday; recently had medication changes.

## 2024-08-06 NOTE — ED Notes (Signed)
 Immediately after administration of epinepherine pt started complaining of chest pain and shortness of breath. Provider notified immediately. EKG obtained. Provider obtained ultrasound IV access to right AC. Pt started feeling better after approx 10 minutes.

## 2024-08-06 NOTE — H&P (Signed)
 Which is the new medication History and Physical    HAE AHLERS FMW:[id number] DOB: [birth year] DOA: 08/06/2024  Referring MD/NP/PA:   PCP: Cordella Corning, FNP   Patient coming from:  The patient is coming from home.     Chief Complaint:   HPI: Deborah Obrien is a 83 y.o. female with medical history significant of      Data reviewed independently and ED Course: pt was found to have     ***       EKG: I have personally reviewed.  Not done in ED, will get one.   ***   Review of Systems:   General: no fevers, chills, no body weight gain, has poor appetite, has fatigue HEENT: no blurry vision, hearing changes or sore throat Respiratory: no dyspnea, coughing, wheezing CV: no chest pain, no palpitations GI: no nausea, vomiting, abdominal pain, diarrhea, constipation GU: no dysuria, burning on urination, increased urinary frequency, hematuria  Ext: no leg edema Neuro: no unilateral weakness, numbness, or tingling, no vision change or hearing loss Skin: no rash, no skin tear. MSK: No muscle spasm, no deformity, no limitation of range of movement in spin Heme: No easy bruising.  Travel history: No recent long distant travel.   Allergy:  Allergies  Allergen Reactions   Fish Allergy Shortness Of Breath    All kinds of fish. Severe vomitting even with smell of fish.    Macrolides And Ketolides Other (See Comments)    Severe stomach pain (mycins)   Meperidine Shortness Of Breath, Nausea Only and Other (See Comments)    Stomach pain    Other Nausea Only, Swelling, Rash, Anaphylaxis, Diarrhea, Shortness Of Breath and Other (See Comments)    Allergen: NON-STEROIDS, bextra - hands and feet swell  Reaction:  Stomach pain   Other Reaction: Intolerance   Prednisone Anaphylaxis and Other (See Comments)    (facial swelling/redness/burning)Increases pts blood sugar  Pt states that she is allergic to all steroids.     Shellfish Allergy Anaphylaxis, Shortness Of Breath,  Diarrhea, Nausea Only and Rash    Stomach pain    Sulfa Antibiotics Shortness Of Breath, Diarrhea, Nausea Only and Other (See Comments)    Reaction:  Stomach pain     Sulfacetamide Sodium Diarrhea, Nausea Only, Other (See Comments) and Shortness Of Breath    Reaction:  Stomach pain    Telbivudine Other (See Comments)    Unknown reaction   Uloric [Febuxostat] Anaphylaxis    Locks pt's body up    Aspirin  Rash and Other (See Comments)    Stomach pain (tolerates lower doses)   Celecoxib Other (See Comments)    GI bleed, weakness, and stomach pain.     Cephalexin Diarrhea, Nausea Only and Other (See Comments)    stomach pain    Codeine Diarrhea, Nausea Only and Other (See Comments)    Reaction:  Stomach pain Pt tolerates morphine     Dilaudid  [Hydromorphone  Hcl] Other (See Comments)    Reactions: easy to overdose - blood pressure drops really low   Erythromycin Diarrhea, Nausea Only and Other (See Comments)    Reaction:  Stomach pain    Iodinated Contrast Media Rash and Other (See Comments)    Pt states that she is unable to have because she has chronic kidney disease.     Lipitor [Atorvastatin Calcium ] Nausea Only    Reaction: nausea, weakness, pass blood   Oxycodone  Other (See Comments)    Reaction:  Stomach pain    Pseudoephedrine  Aleve [Naproxen]     Reaction: severe stomach pain   Atorvastatin Nausea Only and Other (See Comments)    Weakness    Cephalosporins Diarrhea, Nausea Only and Other (See Comments)    Other: abdominal pain  Tolerated 1st generation cephalosporin (CEFAZOLIN ) on 06/02/2022 and 01/21/2023 without documented ADRs.    Doxycycline     Stomach pain    Iodine Rash   Motrin [Ibuprofen]     Reaction: severe stomach pain   Tape Other (See Comments)    Causes pts skin to tear  Pt states that she is able to use paper tape.       Valdecoxib Swelling and Other (See Comments)    Pt states that her hands and feet swell.      Past Medical History:   Diagnosis Date   Acute respiratory failure (HCC) 04/09/2018   Anemia    Anxiety    Aortic atherosclerosis    Bowel obstruction (HCC)    Carotid artery disease    Cerebral microvascular disease    Chronic heart failure with preserved ejection fraction (HFpEF) (HCC)    a.) TTE 04/10/2018: EF 60-65%, mildly dil LA/RA, mod dil RV, mod TR, PASP ; b.) TTE 01/22/2023: EF 60-65%, no rwma, low-nl RV fxn, RVSP 51.2 mmHg. Mild MR. Mod-sev TR.   CKD (chronic kidney disease), stage IV (HCC)    Closed fracture of one rib of right side 05/29/2020   Colon polyp    Complete tear of left rotator cuff 10/16/2014   Coronary artery disease (non-obstructive) 08/25/2016   a.) LHC 08/25/2016:  25% oLM-LM, 20% mLAD, 15% o-pLCx - med mgmt; b.) MV 10/20/2017: no isch/infarct, EF 55-65%   DDD (degenerative disc disease), cervical    a.) s/p ACDF C6-C7   Depression    Diabetic neuropathy (HCC)    Diarrhea 03/04/2022   Displaced intertrochanteric fracture of right femur (HCC)    Fatty liver    Gastritis without bleeding    GERD (gastroesophageal reflux disease)    GI bleed    Gout    Hypertension    Hypothyroidism    Intractable nausea and vomiting 05/25/2024   Long term current use of amiodarone     Low back pain    history of kyphoplasty t12-l1   Morbid obesity (HCC)    Multiple gastric polyps    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   a.) presurgical PCR (+) 10/10/2023 prior to REMOVAL OF NAIL HARDWARE WITH SCREW EXCHANGE (RIGHT HIP)   On apixaban  therapy    Osteoarthritis    Osteoporosis    PAF (paroxysmal atrial fibrillation) (HCC) 2016   a.) CHA2DS2-VASc = 9 (age x 2, sex, HFpEF, HTN, TIA x 2, vascular disease, T2DM) as of 10/13/2023; b.) s/p DCCV 06/28/2017 (120 J x1), 02/06/2018 (150c J x 1), 07/15/2020 (75 J x ); c.) ardiac rate/rhythm maintained on oral amiodarone ; chronically anticoagulated using apixaban    Presence of permanent cardiac pacemaker 07/01/2015   a.) PPM placed 07/01/2015;  b.) device upgraded to dual chamber MDT Azure XT DR MRI SureScan (SN: [id number]) 08/29/2017   Pulmonary hypertension (HCC)    a.) TTE 04/10/2018: PASP 50 mmHg; b.) TTE 01/22/2023: RVSP 51.2 mmHg   Sinus node dysfunction (HCC)    a.) s/p PPM placement 06/2015 --> device upgraded 08/2017   T2DM (type 2 diabetes mellitus) (HCC)    Tendinitis of upper biceps tendon of left shoulder 11/10/2019   TIA (transient ischemic attack)    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 plate & screws    BACK SURGERY  2019   patient describes kyphoplasty for compression fractures, MD referral said fusion   BREAST BIOPSY Right 04/2015   stereo fibroadenomatous change, neg for atypia   BREAST EXCISIONAL BIOPSY Left    neg   CARDIAC CATHETERIZATION Left 08/25/2016   Procedure: Left Heart Cath and Coronary Angiography;  Surgeon: Wolm JINNY Rhyme, MD;  Location: [facility] INVASIVE CV LAB;  Service: Cardiovascular;  Laterality: Left;   CARDIOVERSION N/A 06/28/2017   Procedure: CARDIOVERSION;  Surgeon: Rhyme Wolm JINNY, MD;  Location: [facility] ORS;  Service: Cardiovascular;  Laterality: N/A;   CARDIOVERSION N/A 02/06/2018   Procedure: CARDIOVERSION;  Surgeon: Delford Maude BROCKS, MD;  Location: [facility] ENDOSCOPY;  Service: Cardiovascular;  Laterality: N/A;   CARDIOVERSION N/A 07/15/2020   Procedure: CARDIOVERSION;  Surgeon: Jeffrie Oneil BROCKS, MD;  Location: [facility] ENDOSCOPY;  Service: Cardiovascular;  Laterality: N/A;   CATARACT EXTRACTION, BILATERAL  2012   CHOLECYSTECTOMY     colon blockage  1999   COLON SURGERY  2000   removed 20% of colon. colon had collapsed   COLONOSCOPY   2016   polyps removed 2016   COLONOSCOPY N/A 01/10/2024   Procedure: COLONOSCOPY;  Surgeon: Jinny Carmine, MD;  Location: [facility] ENDOSCOPY;  Service: Endoscopy;  Laterality: N/A;   DORSAL COMPARTMENT RELEASE Left 09/14/2016   Procedure: RELEASE DORSAL COMPARTMENT (DEQUERVAIN);  Surgeon: Lynwood SHAUNNA Hue, MD;  Location: [facility] ORS;  Service: Orthopedics;  Laterality: Left;   ESOPHAGOGASTRODUODENOSCOPY N/A 01/27/2015   Procedure: ESOPHAGOGASTRODUODENOSCOPY (EGD);  Surgeon: Gladis RAYMOND Mariner, MD;  Location: [facility] ENDOSCOPY;  Service: Endoscopy;  Laterality: N/A;   ESOPHAGOGASTRODUODENOSCOPY N/A 01/10/2024   Procedure: EGD (ESOPHAGOGASTRODUODENOSCOPY);  Surgeon: Jinny Carmine, MD;  Location: [facility] ENDOSCOPY;  Service: Endoscopy;  Laterality: N/A;   ESOPHAGOGASTRODUODENOSCOPY N/A 05/29/2024   Procedure: EGD (ESOPHAGOGASTRODUODENOSCOPY);  Surgeon: Jinny Carmine, MD;  Location: [facility] ENDOSCOPY;  Service: Endoscopy;  Laterality: N/A;   ESOPHAGOGASTRODUODENOSCOPY (EGD) WITH PROPOFOL  N/A 07/24/2015   Procedure: ESOPHAGOGASTRODUODENOSCOPY (EGD) WITH PROPOFOL ;  Surgeon: Carmine Jinny, MD;  Location: [facility] ENDOSCOPY;  Service: Endoscopy;  Laterality: N/A;   ESOPHAGOGASTRODUODENOSCOPY (EGD) WITH PROPOFOL  N/A 04/12/2018   Procedure: ESOPHAGOGASTRODUODENOSCOPY (EGD) WITH PROPOFOL ;  Surgeon: Therisa Bi, MD;  Location: [facility] ENDOSCOPY;  Service: Gastroenterology;  Laterality: N/A;   ESOPHAGOGASTRODUODENOSCOPY (EGD) WITH PROPOFOL  N/A 06/22/2018   Procedure: ESOPHAGOGASTRODUODENOSCOPY (EGD) WITH PROPOFOL ;  Surgeon: Teressa Toribio SHAUNNA, MD;  Location: [facility] ENDOSCOPY;  Service: Endoscopy;  Laterality: N/A;   ESOPHAGOGASTRODUODENOSCOPY (EGD) WITH PROPOFOL  N/A 07/09/2018   Procedure: ESOPHAGOGASTRODUODENOSCOPY (EGD) WITH PROPOFOL  with resection of gastric polyps;  Surgeon: Therisa Bi, MD;  Location: [facility] ENDOSCOPY;  Service: Gastroenterology;  Laterality: N/A;   ESOPHAGOGASTRODUODENOSCOPY (EGD) WITH PROPOFOL  N/A 05/17/2019   Procedure:  ESOPHAGOGASTRODUODENOSCOPY (EGD) WITH PROPOFOL ;  Surgeon: Therisa Bi, MD;  Location: [facility] ENDOSCOPY;  Service: Gastroenterology;  Laterality: N/A;   EUS N/A 06/22/2018   Procedure: UPPER ENDOSCOPIC ULTRASOUND (EUS) RADIAL;  Surgeon: Teressa Toribio SHAUNNA, MD;  Location: [facility] ENDOSCOPY;  Service: Endoscopy;  Laterality: N/A;   EYE SURGERY Bilateral    cataract extractions   HARDWARE REMOVAL Right 10/17/2023   Procedure: Right hip removal  of nail hardware with screw exchange;  Surgeon: Tobie Priest, MD;  Location: [facility] ORS;  Service: Orthopedics;  Laterality: Right;   HOT HEMOSTASIS  05/29/2024   Procedure: EGD, WITH ARGON PLASMA COAGULATION;  Surgeon: Jinny Carmine, MD;  Location: [facility] ENDOSCOPY;  Service: Endoscopy;;   HUMERUS IM NAIL Left 06/02/2022   Procedure: INTRAMEDULLARY (IM) NAIL HUMERAL;  Surgeon: Edie Norleen PARAS, MD;  Location: [facility] ORS;  Service: Orthopedics;  Laterality: Left;   INTRAMEDULLARY (IM) NAIL INTERTROCHANTERIC Right 01/21/2023   Procedure: INTRAMEDULLARY (IM) NAIL INTERTROCHANTERIC;  Surgeon: Tobie Priest, MD;  Location: [facility] ORS;  Service: Orthopedics;  Laterality: Right;   JOINT REPLACEMENT Bilateral 2014   Bilateral Knee replacement   LEAD REVISION/REPAIR N/A 08/29/2017   Procedure: LEAD REVISION/REPAIR;  Surgeon: Inocencio Soyla Lunger, MD;  Location: [facility] INVASIVE CV LAB;  Service: Cardiovascular;  Laterality: N/A;   OOPHORECTOMY     PACEMAKER INSERTION Left 07/01/2015   Procedure: INSERTION PACEMAKER;  Surgeon: Marsa Dooms, MD;  Location: [facility] ORS;  Service: Cardiovascular;  Laterality: Left;   PACEMAKER REVISION N/A 08/28/2017   Procedure: PACEMAKER REVISION;  Surgeon: Fernande Elspeth BROCKS, MD;  Location: [facility] INVASIVE CV LAB;  Service: Cardiovascular;  Laterality: N/A;   REPLACEMENT TOTAL KNEE BILATERAL     SHOULDER ARTHROSCOPY WITH ROTATOR CUFF REPAIR AND OPEN BICEPS TENODESIS Left 11/21/2019   Procedure: LEFT SHOULDER ARTHROSCOPY WITH DEBRIDEMENT, DECOMPRESSION, ROTATOR CUFF  REPAIR AND BICEPS TENOLYSIS;  Surgeon: Edie Norleen PARAS, MD;  Location: [facility] ORS;  Service: Orthopedics;  Laterality: Left;   SHOULDER ARTHROSCOPY WITH SUBACROMIAL DECOMPRESSION Left 2013   TEE WITHOUT CARDIOVERSION N/A 06/28/2017   Procedure: TRANSESOPHAGEAL ECHOCARDIOGRAM (TEE);  Surgeon: Hester Wolm PARAS, MD;  Location: [facility] ORS;  Service: Cardiovascular;  Laterality: N/A;   TUBAL LIGATION      Social History:  reports that she has never smoked. She has never used smokeless tobacco. She reports that she does not drink alcohol  and does not use drugs.  Family History:  Family History  Problem Relation Age of Onset   Diabetes Mellitus II Mother    CAD Mother    Heart attack Mother    Cancer Father        skin   Diabetes Mellitus II Brother    Stroke Brother    Breast cancer Neg Hx      Prior to Admission medications   Medication Sig Start Date End Date Taking? Authorizing Provider  acetaminophen  (TYLENOL ) 500 MG tablet Take 2 tablets (1,000 mg total) by mouth every 8 (eight) hours. 10/17/23 10/16/24  Tobie Priest, MD  albuterol  (VENTOLIN  HFA) 108 [phone] Base) MCG/ACT inhaler Inhale 2 puffs into the lungs every 6 (six) hours as needed for wheezing or shortness of breath (cough).    [provider]  allopurinol  (ZYLOPRIM ) 100 MG tablet Take 100 mg by mouth in the morning.    [provider]  alum & mag hydroxide-simeth (MAALOX/MYLANTA) 200-200-20 MG/5ML suspension Take 10 mLs by mouth 3 (three) times daily as needed for indigestion or heartburn. Patient not taking: Reported on 07/23/2024    [provider]  apixaban  (ELIQUIS ) 2.5 MG TABS tablet Take 1 tablet (2.5 mg total) by mouth 2 (two) times daily. 03/06/22   Alexander, Natalie, DO  Cholecalciferol  (VITAMIN D ) 50 MCG (2000 UT) CAPS Take 2,000 Units by mouth in the morning. Patient not taking: Reported on 07/23/2024    [provider]  dicyclomine  (BENTYL ) 10 MG capsule Take 2 capsules (20 mg total) by mouth  3 (  three) times daily before meals. 05/31/24   Jhonny Calvin NOVAK, MD  docusate sodium  (COLACE) 100 MG capsule Take 100 mg by mouth 2 (two) times daily.    [provider]  famotidine  (PEPCID ) 10 MG tablet Take 2 tablets (20 mg total) by mouth 2 (two) times daily. Patient taking differently: Take 10 mg by mouth daily. 05/14/24 06/20/24  Viviann Pastor, MD  furosemide  (LASIX ) 20 MG tablet Take 1 tablet (20 mg total) by mouth daily. 02/02/23   Josette Ade, MD  gabapentin  (NEURONTIN ) 100 MG capsule Take 100 mg by mouth 3 (three) times daily.    [provider]  ibandronate (BONIVA) 150 MG tablet Take 150 mg by mouth every 30 (thirty) days. Take in the morning with a full glass of water , on an empty stomach, and do not take anything else by mouth or lie down for the next 30 min.    [provider]  LANTUS  100 UNIT/ML injection Inject 0.19 mLs (19 Units total) into the skin at bedtime. Patient taking differently: Inject 17 Units into the skin in the morning. 01/11/24   Alexander, Natalie, DO  levothyroxine  (SYNTHROID ) 75 MCG tablet Take 75 mcg by mouth daily before breakfast. 07/09/24   [provider]  magnesium  hydroxide (MILK OF MAGNESIA) 400 MG/5ML suspension Take 30 mLs by mouth daily as needed (constipation).    [provider]  methocarbamol  (ROBAXIN ) 500 MG tablet Take 1 tablet (500 mg total) by mouth 3 (three) times daily. 05/31/24   Jhonny Calvin NOVAK, MD  midodrine  (PROAMATINE ) 5 MG tablet Take 0.5 tablets (2.5 mg total) by mouth 2 (two) times daily with a meal. *DO NOT LIE DOWN AFTER TAKING, DO NOT TAKE AFTER EVENING MEAL. TAKE LAST DOSE OF THE DAY AT LEAST 4HOURS BEFORE BEDTIME* 07/26/24   Laurita Pillion, MD  Multiple Vitamin (MULTIVITAMIN WITH MINERALS) TABS tablet Take 1 tablet by mouth daily. 02/02/23   Josette Ade, MD  mupirocin  ointment (BACTROBAN ) 2 % Place 1 Application into the nose 2 (two) times daily. 07/26/24   Laurita Pillion, MD   NOVOLIN R FLEXPEN RELION 100 UNIT/ML FlexPen Inject 1-10 Units into the skin 3 (three) times daily with meals. Inject subcutaneously Sig: CHECK SUGARS BEFORE MEAL TIME & TAKE INSULIN  BASED ON SLIDING SCALE: <70=NO INSULIN , EAT A SNACK AND RECHECK SUGAR SOON. 70-120=0U, 121-150=2U, 151-200=4U, 201-250=5U, 251-300=6U, 301-350=8U, 351-400=10U, >400=CALL MD 05/14/24   [provider]  nystatin  powder Apply 1 Application topically in the morning and at bedtime. (0800 & 2000) Apply topically to groin    [provider]  pantoprazole  (PROTONIX ) 40 MG tablet Take 1 tablet (40 mg total) by mouth 2 (two) times daily before a meal. 05/31/24   Sreenath, Sudheer B, MD  rOPINIRole  (REQUIP ) 0.5 MG tablet Take 0.5 mg by mouth at bedtime. (2000)    [provider]  senna (SENOKOT) 8.6 MG tablet Take 1 tablet (8.6 mg total) by mouth at bedtime as needed for constipation. 01/11/24   Alexander, Natalie, DO  sertraline  (ZOLOFT ) 100 MG tablet Take 100 mg by mouth in the morning. (0800)    [provider]  sodium zirconium cyclosilicate  (LOKELMA ) 5 g packet Take 5 g by mouth 2 (two) times a week. Monday and Thursday for 10 doses. 07/29/24   Laurita Pillion, MD  sucralfate  (CARAFATE ) 1 g tablet Take 1 tablet (1 g total) by mouth 4 (four) times daily. 05/14/24   Viviann Pastor, MD    Physical Exam: Vitals:   08/06/24 1741  08/06/24 1742 08/06/24 1959  BP:  (!) 130/91 (!) 147/54  Pulse: 76  87  Resp: 20  (!) 22  Temp: 97.9 F (36.6 C)    TempSrc: Oral    SpO2: 95%  100%   General: Not in acute distress HEENT:       Eyes: PERRL, EOMI, no jaundice       ENT: No discharge from the ears and nose, no pharynx injection, no tonsillar enlargement.        Neck: No JVD, no bruit, no mass felt. Heme: No neck lymph node enlargement. Cardiac: S1/S2, RRR, No murmurs, No gallops or rubs. Respiratory: No rales, wheezing, rhonchi or rubs. GI: Soft, nondistended, nontender, no rebound pain, no  organomegaly, BS present. GU: No hematuria Ext: No pitting leg edema bilaterally. 1+DP/PT pulse bilaterally. Musculoskeletal: No joint deformities, No joint redness or warmth, no limitation of ROM in spin. Skin: No rashes.  Neuro: Alert, oriented X3, cranial nerves II-XII grossly intact, moves all extremities normally. Muscle strength 5/5 in all extremities, sensation to light touch intact. Brachial reflex 2+ bilaterally. Knee reflex 1+ bilaterally. Negative Babinski's sign. Normal finger to nose test. Psych: Patient is not psychotic, no suicidal or hemocidal ideation.  Labs on Admission: I have personally reviewed following labs and imaging studies  CBC: Recent Labs  Lab 08/06/24 1925  WBC 8.2  HGB 11.8*  HCT 35.6*  MCV 97.3  PLT 193   Basic Metabolic Panel: Recent Labs  Lab 08/06/24 1925  NA 139  K 3.2*  CL 101  CO2 22  GLUCOSE 142*  BUN 32*  CREATININE 1.38*  CALCIUM  9.6   GFR: CrCl cannot be calculated (Unknown ideal weight.). Liver Function Tests: No results for input(s): AST, ALT, ALKPHOS, BILITOT, PROT, ALBUMIN  in the last 168 hours. No results for input(s): LIPASE, AMYLASE in the last 168 hours. No results for input(s): AMMONIA in the last 168 hours. Coagulation Profile: No results for input(s): INR, PROTIME in the last 168 hours. Cardiac Enzymes: No results for input(s): CKTOTAL, CKMB, CKMBINDEX, TROPONINI in the last 168 hours. BNP (last 3 results) No results for input(s): PROBNP in the last 8760 hours. HbA1C: No results for input(s): HGBA1C in the last 72 hours. CBG: Recent Labs  Lab 08/06/24 1852 08/06/24 2046  GLUCAP 50* 200*   Lipid Profile: No results for input(s): CHOL, HDL, LDLCALC, TRIG, CHOLHDL, LDLDIRECT in the last 72 hours. Thyroid  Function Tests: No results for input(s): TSH, T4TOTAL, FREET4, T3FREE, THYROIDAB in the last 72 hours. Anemia Panel: No results for input(s):  VITAMINB12, FOLATE, FERRITIN, TIBC, IRON , RETICCTPCT in the last 72 hours. Urine analysis:    Component Value Date/Time   COLORURINE YELLOW (A) 08/06/2024 2123   APPEARANCEUR CLEAR (A) 08/06/2024 2123   APPEARANCEUR Hazy 10/29/2012 1534   LABSPEC 1.014 08/06/2024 2123   LABSPEC 1.010 10/29/2012 1534   PHURINE 7.0 08/06/2024 2123   GLUCOSEU NEGATIVE 08/06/2024 2123   GLUCOSEU Negative 10/29/2012 1534   HGBUR NEGATIVE 08/06/2024 2123   BILIRUBINUR NEGATIVE 08/06/2024 2123   BILIRUBINUR Negative 10/29/2012 1534   KETONESUR NEGATIVE 08/06/2024 2123   PROTEINUR NEGATIVE 08/06/2024 2123   NITRITE NEGATIVE 08/06/2024 2123   LEUKOCYTESUR NEGATIVE 08/06/2024 2123   LEUKOCYTESUR Negative 10/29/2012 1534   Sepsis Labs: @LABRCNTIP (procalcitonin:4,lacticidven:4) )No results found for this or any previous visit (from the past 240 hours).   Radiological Exams on Admission:   Assessment/Plan Principal Problem:   Allergic reaction   Assessment and Plan: No notes have been filed under  this hospital service. Service: Hospitalist      Principal Problem:   Allergic reaction   Allergic reaction: Patient developed concentrating shortly after he received Rocephin. Her symptoms has improved after treated with Solu-Medrol  and Benadryl .  -will continue Solu-Medrol  60 mg tid -IV Benadryl  25 mg bid -IV pepcid  20 mg bid -prn EpiPen  -IVF   DVT ppx: SQ Heparin          SQ Lovenox   Code Status: Full code   ***  Family Communication:     not done, no family member is at bed side.              Yes, patient's    at bed side.       by phone   ***  Disposition Plan:  Anticipate discharge back to previous environment  Consults called:    Admission status and Level of care: Progressive:    for obs as inpt        Dispo: The patient is from: {From:23814}              Anticipated d/c is to: {To:23815}              Anticipated d/c date is: {Days:23816}              Patient  currently {Medically stable:23817}    Severity of Illness:  {Observation/Inpatient:21159}       Date of Service 08/06/2024    Caleb Exon [facility]   If 7PM-7AM, please contact night-coverage [email or web address] 08/06/2024, 10:58 PM

## 2024-08-07 ENCOUNTER — Other Ambulatory Visit: Payer: Self-pay

## 2024-08-07 DIAGNOSIS — T7840XA Allergy, unspecified, initial encounter: Secondary | ICD-10-CM | POA: Diagnosis not present

## 2024-08-07 LAB — CBG MONITORING, ED
Glucose-Capillary: 170 mg/dL — ABNORMAL HIGH (ref 70–99)
Glucose-Capillary: 156 mg/dL — ABNORMAL HIGH (ref 70–99)
Glucose-Capillary: 170 mg/dL — ABNORMAL HIGH (ref 70–99)

## 2024-08-07 LAB — CBC
HCT: 35.8 % — ABNORMAL LOW (ref 36.0–46.0)
Hemoglobin: 11.6 g/dL — ABNORMAL LOW (ref 12.0–15.0)
MCH: 32 pg (ref 26.0–34.0)
MCHC: 32.4 g/dL (ref 30.0–36.0)
MCV: 98.6 fL (ref 80.0–100.0)
Platelets: 168 K/uL (ref 150–400)
RBC: 3.63 MIL/uL — ABNORMAL LOW (ref 3.87–5.11)
RDW: 13 % (ref 11.5–15.5)
WBC: 6.7 K/uL (ref 4.0–10.5)
nRBC: 0 % (ref 0.0–0.2)

## 2024-08-07 LAB — BASIC METABOLIC PANEL WITH GFR
Anion gap: 10 (ref 5–15)
BUN: 30 mg/dL — ABNORMAL HIGH (ref 8–23)
CO2: 27 mmol/L (ref 22–32)
Calcium: 8.8 mg/dL — ABNORMAL LOW (ref 8.9–10.3)
Chloride: 102 mmol/L (ref 98–111)
Creatinine, Ser: 1.28 mg/dL — ABNORMAL HIGH (ref 0.44–1.00)
GFR, Estimated: 41 mL/min — ABNORMAL LOW
Glucose, Bld: 165 mg/dL — ABNORMAL HIGH (ref 70–99)
Potassium: 3.9 mmol/L (ref 3.5–5.1)
Sodium: 139 mmol/L (ref 135–145)

## 2024-08-07 LAB — TROPONIN T, HIGH SENSITIVITY
Troponin T High Sensitivity: 41 ng/L — ABNORMAL HIGH (ref 0–19)
Troponin T High Sensitivity: 43 ng/L — ABNORMAL HIGH (ref 0–19)

## 2024-08-07 LAB — PRO BRAIN NATRIURETIC PEPTIDE: Pro Brain Natriuretic Peptide: 801 pg/mL — ABNORMAL HIGH

## 2024-08-07 LAB — PHOSPHORUS: Phosphorus: 2.5 mg/dL (ref 2.5–4.6)

## 2024-08-07 LAB — MAGNESIUM
Magnesium: 1.6 mg/dL — ABNORMAL LOW (ref 1.7–2.4)
Magnesium: 1.6 mg/dL — ABNORMAL LOW (ref 1.7–2.4)

## 2024-08-07 LAB — RESP PANEL BY RT-PCR (RSV, FLU A&B, COVID)  RVPGX2
Influenza A by PCR: NEGATIVE
Influenza B by PCR: NEGATIVE
Resp Syncytial Virus by PCR: NEGATIVE
SARS Coronavirus 2 by RT PCR: NEGATIVE

## 2024-08-07 MED ORDER — FUROSEMIDE 40 MG PO TABS
20.0000 mg | ORAL_TABLET | Freq: Every day | ORAL | Status: DC
  Administered 2024-08-07: 20 mg via ORAL
  Filled 2024-08-07: qty 1

## 2024-08-07 MED ORDER — DIPHENHYDRAMINE HCL 25 MG PO TABS
25.0000 mg | ORAL_TABLET | Freq: Three times a day (TID) | ORAL | 0 refills | Status: AC | PRN
  Filled 2024-08-07: qty 30, 10d supply, fill #0

## 2024-08-07 MED ORDER — ALBUTEROL SULFATE (2.5 MG/3ML) 0.083% IN NEBU: 2.5000 mg | INHALATION_SOLUTION | RESPIRATORY_TRACT | Status: DC | PRN

## 2024-08-07 MED ORDER — INSULIN ASPART 100 UNIT/ML IJ SOLN
0.0000 [IU] | Freq: Three times a day (TID) | INTRAMUSCULAR | Status: DC
  Administered 2024-08-07 (×2): 2 [IU] via SUBCUTANEOUS
  Filled 2024-08-07 (×2): qty 2

## 2024-08-07 MED ORDER — INSULIN ASPART 100 UNIT/ML IJ SOLN: 0.0000 [IU] | Freq: Every day | INTRAMUSCULAR | Status: DC

## 2024-08-07 MED ORDER — MAGNESIUM SULFATE 2 GM/50ML IV SOLN
2.0000 g | Freq: Once | INTRAVENOUS | Status: AC
  Administered 2024-08-07: 2 g via INTRAVENOUS
  Filled 2024-08-07: qty 50

## 2024-08-07 NOTE — TOC Transition Note (Signed)
 Transition of Care [facility]) - Discharge Note   Patient Details  Name: Deborah Obrien MRN: [id number] Date of Birth: [birth year]  Transition of Care [facility]) CM/SW Contact:  Deborah Jackquline GORMAN, RN Phone Number: 08/07/2024, 1:19 PM   Clinical Narrative:     12:35 pm: RNCM received a message via secure chat asking if the patient can go back to [facility] today.   12:43 pm: RNCM called [facility] @ [phone] and left a message for Christian. Awaiting call back. MD made aware.  12:55 pm: RNCM received a call back from Christian and he said that he will take her back today and will pick her up at 1:45pm. MD made aware. No further concerns. RNCM signing off.    Final next level of care: Assisted Living Barriers to Discharge: Barriers Resolved   Patient Goals and CMS Choice            Discharge Placement                Patient to be transferred to facility by: [facility] Picking Patient Up Name of family member notified: Deborah Obrien Patient and family notified of of transfer: 08/07/24  Discharge Plan and Services Additional resources added to the After Visit Summary for                                       Social Drivers of Health (SDOH) Interventions SDOH Screenings   Food Insecurity: No Food Insecurity (07/23/2024)  Housing: Unknown (07/28/2024)  Transportation Needs: No Transportation Needs (07/23/2024)  Utilities: Not At Risk (07/23/2024)  Financial Resource Strain: Low Risk  (05/23/2024)   Received from [facility]  Physical Activity: Unknown (02/19/2019)   Received from [facility]  Social Connections: Socially Integrated (07/23/2024)  Stress: Stress Concern Present (02/19/2019)   Received from [facility]  Tobacco Use: Low Risk  (08/06/2024)     Readmission Risk Interventions    05/27/2024   10:43 AM 01/10/2024    9:49 AM 10/30/2023    4:25 PM  Readmission Risk Prevention Plan   Transportation Screening Complete  Complete  Medication Review (RN Care Manager)   Complete  PCP or Specialist appointment within 3-5 days of discharge Complete Complete Complete  HRI or Home Care Consult Complete Complete Complete  SW Recovery Care/Counseling Consult Complete Complete   Palliative Care Screening Not Applicable Not Applicable Complete  Skilled Nursing Facility Not Applicable Not Applicable Complete

## 2024-08-07 NOTE — Discharge Summary (Signed)
 Physician Discharge Summary   Patient: Deborah Obrien MRN: [id number] DOB: [birth year]  Admit date:     08/06/2024  Discharge date: 08/07/24  Discharge Physician: Laree Lock   PCP: Cordella Corning, FNP   Recommendations at discharge:   Follow up with provider at [facility] health care - Repeat BMP, Mag in 5 days  Discharge Diagnoses: Principal Problem:   Allergic reaction Active Problems:   CAD (coronary artery disease)   Rash   Myocardial injury   Hypokalemia   CKD stage 3b, GFR 30-44 ml/min (HCC)   Type 2 diabetes mellitus with renal manifestations (HCC)   Hypothyroidism   (HFpEF) heart failure with preserved ejection fraction (HCC)   Paroxysmal atrial fibrillation (HCC)   History of gout   H/O TIA (transient ischemic attack) and stroke   Restless leg syndrome   Depression with anxiety   Hospital Course: Deborah Obrien is a 83 y.o. female with medical history significant of allergic reaction to multiple medications, parox Afib/flutter on apixaban , SND s/p PPM, HFpEF, CAD, CKD lllb, T2DM, HTN, hypothyroid , anxiety, vascular dementia, history of hemorrhoidal bleeding, RLS, UTI, who presents with allergic reaction and rash.   Patient was recently hospitalized from 11/10 - 11/17 due to complicated UTI secondary to MRSA infection.  Patient was treated with Zovirax and fosfomycin. UA negative  Patient was admitted for allergy to unknown inciting factor, Per ED physician, after receiving 1 dose of EpiPen , patient developed chest pain, difficulty swallowing, numbness in her toes. Symptoms have resolved. Patient is allergic to prednisone with anaphylactic reaction in the past, cannot give steroid.   Hospital course as below  Assessment and Plan: Allergic reaction and rash: Could not identify the inciting medications.  Patient does not have stridor.  Rash and scratchy throat have significantly improved - Avoid Epipen , steroids - Pepcid , Bendryl prn   CAD (coronary  artery disease) and myocardial injury: trop  32 --> 41 -> 43 ([place]).  No chest pain. - on Eliquis , allergic to aspirin    Hypokalemia and hypomagnesemia - resolved - Discontinue Lokelma  at home - Repeat labs in 3-5 days   CKD stage 3b, GFR 30-44 ml/min (HCC): Close to baseline.  Recent baseline creatinine 1.2-1.4.     Type 2 diabetes mellitus with renal manifestations [facility]): Recent A1c 9.2, poorly controlled.   - Home regimen   Hypothyroidism - Synthroid    (HFpEF) heart failure with preserved ejection fraction (HCC): 2D echo on 01/22/2023 showed EF of 60-65%.  proBNP 801, no leg edema. - s/p IV fluids on admission, resume lasix    Paroxysmal atrial fibrillation (HCC) -Continue Eliquis  - Her amiodarone  was recently discontinued.   History of gout -Allopurinol    H/O TIA (transient ischemic attack) and stroke -on Eliquis    Restless leg syndrome -Requip    Depression with anxiety - Zoloft    Consultants: None Procedures performed: None  Disposition: [place] health care Diet recommendation:  Discharge Diet Orders (From admission, onward)     Start     Ordered   08/07/24 0000  Diet - low sodium heart healthy        08/07/24 1335            DISCHARGE MEDICATION: Allergies as of 08/07/2024       Reactions   Epipen  2-pak [epinephrine ] Shortness Of Breath   Short of breath, chest pain   Fish Allergy Shortness Of Breath   All kinds of fish. Severe vomitting even with smell of fish.    Macrolides And Ketolides Other (See  Comments)   Severe stomach pain (mycins)   Meperidine Shortness Of Breath, Nausea Only, Other (See Comments)   Stomach pain    Other Nausea Only, Swelling, Rash, Anaphylaxis, Diarrhea, Shortness Of Breath, Other (See Comments)   Allergen: NON-STEROIDS, bextra - hands and feet swell Reaction:  Stomach pain   Other Reaction: Intolerance   Prednisone Anaphylaxis, Other (See Comments)   (facial swelling/redness/burning)Increases pts blood sugar  Pt  states that she is allergic to all steroids.     Shellfish Allergy Anaphylaxis, Shortness Of Breath, Diarrhea, Nausea Only, Rash   Stomach pain    Sulfa Antibiotics Shortness Of Breath, Diarrhea, Nausea Only, Other (See Comments)   Reaction:  Stomach pain    Sulfacetamide Sodium Diarrhea, Nausea Only, Other (See Comments), Shortness Of Breath   Reaction:  Stomach pain    Telbivudine Other (See Comments)   Unknown reaction   Uloric [febuxostat] Anaphylaxis   Locks pt's body up   Aspirin  Rash, Other (See Comments)   Stomach pain (tolerates lower doses)   Celecoxib Other (See Comments)   GI bleed, weakness, and stomach pain.     Cephalexin Diarrhea, Nausea Only, Other (See Comments)   stomach pain    Codeine Diarrhea, Nausea Only, Other (See Comments)   Reaction:  Stomach pain Pt tolerates morphine     Dilaudid  [hydromorphone  Hcl] Other (See Comments)   Reactions: easy to overdose - blood pressure drops really low   Erythromycin Diarrhea, Nausea Only, Other (See Comments)   Reaction:  Stomach pain    Iodinated Contrast Media Rash, Other (See Comments)   Pt states that she is unable to have because she has chronic kidney disease.     Lipitor [atorvastatin Calcium ] Nausea Only   Reaction: nausea, weakness, pass blood   Oxycodone  Other (See Comments)   Reaction:  Stomach pain    Pseudoephedrine    Aleve [naproxen]    Reaction: severe stomach pain   Atorvastatin Nausea Only, Other (See Comments)   Weakness    Cephalosporins Diarrhea, Nausea Only, Other (See Comments)   Other: abdominal pain Tolerated 1st generation cephalosporin (CEFAZOLIN ) on 06/02/2022 and 01/21/2023 without documented ADRs.    Doxycycline    Stomach pain    Iodine Rash   Motrin [ibuprofen]    Reaction: severe stomach pain   Tape Other (See Comments)   Causes pts skin to tear  Pt states that she is able to use paper tape.     Valdecoxib Swelling, Other (See Comments)   Pt states that her hands and feet swell.           Medication List     STOP taking these medications    Lokelma  5 g packet Generic drug: sodium zirconium cyclosilicate    midodrine  2.5 MG tablet Commonly known as: PROAMATINE    midodrine  5 MG tablet Commonly known as: PROAMATINE        TAKE these medications    acetaminophen  500 MG tablet Commonly known as: TYLENOL  Take 2 tablets (1,000 mg total) by mouth every 8 (eight) hours.   albuterol  108 (90 Base) MCG/ACT inhaler Commonly known as: VENTOLIN  HFA Inhale 2 puffs into the lungs every 6 (six) hours as needed for wheezing or shortness of breath (cough).   allopurinol  100 MG tablet Commonly known as: ZYLOPRIM  Take 100 mg by mouth in the morning.   alum & mag hydroxide-simeth 200-200-20 MG/5ML suspension Commonly known as: MAALOX/MYLANTA Take 10 mLs by mouth 3 (three) times daily as needed for indigestion or heartburn.  apixaban  2.5 MG Tabs tablet Commonly known as: Eliquis  Take 1 tablet (2.5 mg total) by mouth 2 (two) times daily.   dicyclomine  10 MG capsule Commonly known as: BENTYL  Take 2 capsules (20 mg total) by mouth 3 (three) times daily before meals.   diphenhydrAMINE  25 mg capsule Commonly known as: Benadryl  Allergy Take 1 capsule (25 mg total) by mouth every 8 (eight) hours as needed for itching or allergies.   docusate sodium  100 MG capsule Commonly known as: COLACE Take 100 mg by mouth 2 (two) times daily.   famotidine  10 MG tablet Commonly known as: PEPCID  Take 2 tablets (20 mg total) by mouth 2 (two) times daily.   famotidine  20 MG tablet Commonly known as: PEPCID  Take 20 mg by mouth 2 (two) times daily.   furosemide  20 MG tablet Commonly known as: LASIX  Take 1 tablet (20 mg total) by mouth daily.   gabapentin  100 MG capsule Commonly known as: NEURONTIN  Take 100 mg by mouth 3 (three) times daily.   ibandronate 150 MG tablet Commonly known as: BONIVA Take 150 mg by mouth every 30 (thirty) days. Take in the morning with a full  glass of water , on an empty stomach, and do not take anything else by mouth or lie down for the next 30 min.   Lantus  100 UNIT/ML injection Generic drug: insulin  glargine Inject 0.19 mLs (19 Units total) into the skin at bedtime.   Lantus  SoloStar 100 UNIT/ML Solostar Pen Generic drug: insulin  glargine Inject 17 Units into the skin at bedtime.   levothyroxine  75 MCG tablet Commonly known as: SYNTHROID  Take 75 mcg by mouth daily before breakfast.   magnesium  hydroxide 400 MG/5ML suspension Commonly known as: MILK OF MAGNESIA Take 30 mLs by mouth daily as needed (constipation).   methocarbamol  500 MG tablet Commonly known as: ROBAXIN  Take 1 tablet (500 mg total) by mouth 3 (three) times daily.   multivitamin with minerals Tabs tablet Take 1 tablet by mouth daily.   mupirocin  ointment 2 % Commonly known as: BACTROBAN  Place 1 Application into the nose 2 (two) times daily.   NovoLIN R FlexPen ReliOn 100 UNIT/ML FlexPen Generic drug: Insulin  Regular Human Inject 1-10 Units into the skin 3 (three) times daily with meals. Inject subcutaneously Sig: CHECK SUGARS BEFORE MEAL TIME & TAKE INSULIN  BASED ON SLIDING SCALE: <70=NO INSULIN , EAT A SNACK AND RECHECK SUGAR SOON. 70-120=0U, 121-150=2U, 151-200=4U, 201-250=5U, 251-300=6U, 301-350=8U, 351-400=10U, >400=CALL MD   nystatin  powder Apply 1 Application topically in the morning and at bedtime. (0800 & 2000) Apply topically to groin   oxybutynin  5 MG tablet Commonly known as: DITROPAN  Take 5 mg by mouth 2 (two) times daily.   pantoprazole  40 MG tablet Commonly known as: PROTONIX  Take 1 tablet (40 mg total) by mouth 2 (two) times daily before a meal.   rOPINIRole  0.5 MG tablet Commonly known as: REQUIP  Take 0.5 mg by mouth at bedtime. (2000)   senna 8.6 MG tablet Commonly known as: SENOKOT Take 1 tablet (8.6 mg total) by mouth at bedtime as needed for constipation.   sertraline  100 MG tablet Commonly known as: ZOLOFT  Take 100  mg by mouth in the morning. (0800)   sucralfate  1 g tablet Commonly known as: Carafate  Take 1 tablet (1 g total) by mouth 4 (four) times daily.   Vitamin D  50 MCG (2000 UT) Caps Take 2,000 Units by mouth in the morning.        Discharge Exam: Filed Weights   08/07/24 0057  Weight: 70.4 kg   General: Not in acute distress HEENT: No stridor       Eyes: PERRL, EOMI, no jaundice       ENT: No discharge from the ears and nose, no pharynx injection, no tonsillar enlargement.        Neck: No JVD, no bruit, no mass felt. Heme: No neck lymph node enlargement. Cardiac: S1/S2, RRR, No murmurs, No gallops or rubs. Respiratory: No rales, wheezing, rhonchi or rubs. GI: Soft, nondistended, nontender, no rebound pain, no organomegaly, BS present. Ext: No pitting leg edema bilaterally. 1+DP/PT pulse bilaterally. Musculoskeletal: No joint deformities, No joint redness or warmth, no limitation of ROM in spin. Skin: Has erythematous rash in both legs and arms - improved Neuro: Alert, oriented X3, cranial nerves II-XII grossly intact, moves all extremities normally.  Psych: Patient is not psychotic, no suicidal or hemocidal ideation  Condition at discharge: fair  The results of significant diagnostics from this hospitalization (including imaging, microbiology, ancillary and laboratory) are listed below for reference.   Imaging Studies: DG Chest Port 1 View Result Date: 08/06/2024 EXAM: 1 VIEW(S) XRAY OF THE CHEST 08/06/2024 07:19:00 PM COMPARISON: Comparison with 07/22/2024. CLINICAL HISTORY: Shortness of breath, allergic reaction. FINDINGS: LINES, TUBES AND DEVICES: Cardiac pacemaker. LUNGS AND PLEURA: Shallow inspiration. Emphysematous changes in the lungs. Scattered fibrosis. No airspace disease or consolidation. No pleural effusion or pneumothorax. HEART AND MEDIASTINUM: Calcification of the aorta. BONES AND SOFT TISSUES: Postoperative changes in the left humerus. Degenerative changes in the  shoulders. IMPRESSION: 1. No acute cardiopulmonary process to explain shortness of breath. 2. Emphysematous changes in the lungs. Electronically signed by: Elsie Gravely MD 08/06/2024 07:30 PM EST RP Workstation: [id number]   CT ABDOMEN PELVIS WO CONTRAST Result Date: 07/23/2024 CLINICAL DATA:  Urinary tract infection. EXAM: CT ABDOMEN AND PELVIS WITHOUT CONTRAST TECHNIQUE: Multidetector CT imaging of the abdomen and pelvis was performed following the standard protocol without IV contrast. RADIATION DOSE REDUCTION: This exam was performed according to the departmental dose-optimization program which includes automated exposure control, adjustment of the mA and/or kV according to patient size and/or use of iterative reconstruction technique. COMPARISON:  05/25/2024 FINDINGS: Lower chest: Linear atelectasis or scarring noted at the lung bases. Hepatobiliary: No suspicious focal abnormality in the liver on this study without intravenous contrast. Gallbladder is surgically absent. Common bile duct measures 10 mm diameter, similar to prior and likely secondary to prior cholecystectomy. Pancreas: No focal mass lesion. No dilatation of the main duct. No intraparenchymal cyst. No peripancreatic edema. Spleen: No splenomegaly. No suspicious focal mass lesion. Adrenals/Urinary Tract: No adrenal nodule or mass. Kidneys unremarkable. No evidence for hydroureter. The urinary bladder appears normal for the degree of distention. Stomach/Bowel: Stomach is unremarkable. No gastric wall thickening. No evidence of outlet obstruction. Duodenum is normally positioned as is the ligament of Treitz. No small bowel wall thickening. No small bowel dilatation. The terminal ileum is normal. Nonvisualization of the appendix is consistent with the reported history of appendectomy. No gross colonic mass. No colonic wall thickening. Moderate stool volume evident. Vascular/Lymphatic: There is moderate atherosclerotic calcification of the  abdominal aorta without aneurysm. There is no gastrohepatic or hepatoduodenal ligament lymphadenopathy. No retroperitoneal or mesenteric lymphadenopathy. No pelvic sidewall lymphadenopathy. Reproductive: There is no adnexal mass. Other: No intraperitoneal free fluid. Musculoskeletal: Fixation hardware noted proximal right femur, incompletely visualized. Lumbosacral fusion hardware evident. Bones are diffusely demineralized. Status post vertebral augmentation T12 and L1. IMPRESSION: 1. No acute findings in the abdomen or pelvis. No  hydroureteronephrosis. No urinary stone disease. 2. Moderate stool volume. Imaging features could be compatible with clinical constipation. 3.  Aortic Atherosclerosis (ICD10-I70.0). Electronically Signed   By: Camellia Candle M.D.   On: 07/23/2024 09:28   DG Chest 2 View Result Date: 07/22/2024 EXAM: 2 VIEW(S) XRAY OF THE CHEST 07/22/2024 04:15:31 PM COMPARISON: Chest x-ray 10/28/2023 CLINICAL HISTORY: cough FINDINGS: LUNGS AND PLEURA: Small branching calcifications are seen in the bilateral upper lobes, similar to prior study. No pulmonary edema. No pleural effusion. No pneumothorax. HEART AND MEDIASTINUM: Left-sided pacemaker is again seen. No acute abnormality of the cardiac and mediastinal silhouettes. BONES AND SOFT TISSUES: There are healed right-sided rib fractures. Cervical spinal fusion plate is present. Lower thoracic vertebroplasty changes are again noted. IMPRESSION: 1. No acute findings. Electronically signed by: Greig Pique MD 07/22/2024 04:35 PM EST RP Workstation: [id number]    Microbiology: Results for orders placed or performed during the hospital encounter of 08/06/24  Resp panel by RT-PCR (RSV, Flu A&B, Covid) Anterior Nasal Swab     Status: None   Collection Time: 08/06/24 11:52 PM   Specimen: Anterior Nasal Swab  Result Value Ref Range Status   SARS Coronavirus 2 by RT PCR NEGATIVE NEGATIVE Final    Comment: (NOTE) SARS-CoV-2 target nucleic acids are NOT  DETECTED.  The SARS-CoV-2 RNA is generally detectable in upper respiratory specimens during the acute phase of infection. The lowest concentration of SARS-CoV-2 viral copies this assay can detect is 138 copies/mL. A negative result does not preclude SARS-Cov-2 infection and should not be used as the sole basis for treatment or other patient management decisions. A negative result may occur with  improper specimen collection/handling, submission of specimen other than nasopharyngeal swab, presence of viral mutation(s) within the areas targeted by this assay, and inadequate number of viral copies(<138 copies/mL). A negative result must be combined with clinical observations, patient history, and epidemiological information. The expected result is Negative.  Fact Sheet for Patients:  bloggercourse.com  Fact Sheet for Healthcare Providers:  seriousbroker.it  This test is no t yet approved or cleared by the [place]  FDA and  has been authorized for detection and/or diagnosis of SARS-CoV-2 by FDA under an Emergency Use Authorization (EUA). This EUA will remain  in effect (meaning this test can be used) for the duration of the COVID-19 declaration under Section 564(b)(1) of the Act, 21 U.S.C.section 360bbb-3(b)(1), unless the authorization is terminated  or revoked sooner.       Influenza A by PCR NEGATIVE NEGATIVE Final   Influenza B by PCR NEGATIVE NEGATIVE Final    Comment: (NOTE) The Xpert Xpress SARS-CoV-2/FLU/RSV plus assay is intended as an aid in the diagnosis of influenza from Nasopharyngeal swab specimens and should not be used as a sole basis for treatment. Nasal washings and aspirates are unacceptable for Xpert Xpress SARS-CoV-2/FLU/RSV testing.  Fact Sheet for Patients: bloggercourse.com  Fact Sheet for Healthcare Providers: seriousbroker.it  This test is not yet  approved or cleared by the [place]  FDA and has been authorized for detection and/or diagnosis of SARS-CoV-2 by FDA under an Emergency Use Authorization (EUA). This EUA will remain in effect (meaning this test can be used) for the duration of the COVID-19 declaration under Section 564(b)(1) of the Act, 21 U.S.C. section 360bbb-3(b)(1), unless the authorization is terminated or revoked.     Resp Syncytial Virus by PCR NEGATIVE NEGATIVE Final    Comment: (NOTE) Fact Sheet for Patients: bloggercourse.com  Fact Sheet for Healthcare Providers: seriousbroker.it  This test is not yet approved or cleared by the [place]  FDA and has been authorized for detection and/or diagnosis of SARS-CoV-2 by FDA under an Emergency Use Authorization (EUA). This EUA will remain in effect (meaning this test can be used) for the duration of the COVID-19 declaration under Section 564(b)(1) of the Act, 21 U.S.C. section 360bbb-3(b)(1), unless the authorization is terminated or revoked.  Performed at [facility], [address] [place], [place] [address]     Labs: CBC: Recent Labs  Lab 08/06/24 1925 08/07/24 0214  WBC 8.2 6.7  HGB 11.8* 11.6*  HCT 35.6* 35.8*  MCV 97.3 98.6  PLT 193 168   Basic Metabolic Panel: Recent Labs  Lab 08/06/24 1925 08/06/24 2352 08/07/24 0214  NA 139  --  139  K 3.2*  --  3.9  CL 101  --  102  CO2 22  --  27  GLUCOSE 142*  --  165*  BUN 32*  --  30*  CREATININE 1.38*  --  1.28*  CALCIUM  9.6  --  8.8*  MG  --  1.6*  --   PHOS  --   --  2.5   Liver Function Tests: No results for input(s): AST, ALT, ALKPHOS, BILITOT, PROT, ALBUMIN  in the last 168 hours. CBG: Recent Labs  Lab 08/06/24 1852 08/06/24 2046 08/07/24 0211 08/07/24 0746 08/07/24 1139  GLUCAP 50* 200* 170* 156* 170*    Discharge time spent: greater than 30 minutes.  Signed: Laree Lock, MD [facility] 08/07/2024

## 2024-08-12 ENCOUNTER — Other Ambulatory Visit: Payer: Self-pay

## 2024-09-09 ENCOUNTER — Ambulatory Visit: Payer: Medicare Other

## 2024-09-09 DIAGNOSIS — I480 Paroxysmal atrial fibrillation: Secondary | ICD-10-CM | POA: Diagnosis not present

## 2024-09-10 LAB — CUP PACEART REMOTE DEVICE CHECK
Battery Remaining Longevity: 63 mo
Battery Voltage: 2.9 V
Brady Statistic AP VP Percent: 0.06 %
Brady Statistic AP VS Percent: 96.45 %
Brady Statistic AS VP Percent: 0 %
Brady Statistic AS VS Percent: 3.5 %
Brady Statistic RA Percent Paced: 96.41 %
Brady Statistic RV Percent Paced: 0.06 %
Date Time Interrogation Session: 20251229021430
Implantable Lead Connection Status: 753985
Implantable Lead Connection Status: 753985
Implantable Lead Implant Date: 20161019
Implantable Lead Implant Date: 20181217
Implantable Lead Location: 753859
Implantable Lead Location: 753860
Implantable Lead Model: 5076
Implantable Lead Model: 5076
Implantable Pulse Generator Implant Date: 20181217
Lead Channel Impedance Value: 285 Ohm
Lead Channel Impedance Value: 342 Ohm
Lead Channel Impedance Value: 399 Ohm
Lead Channel Impedance Value: 399 Ohm
Lead Channel Pacing Threshold Amplitude: 0.75 V
Lead Channel Pacing Threshold Amplitude: 0.875 V
Lead Channel Pacing Threshold Pulse Width: 0.4 ms
Lead Channel Pacing Threshold Pulse Width: 0.4 ms
Lead Channel Sensing Intrinsic Amplitude: 3.375 mV
Lead Channel Sensing Intrinsic Amplitude: 3.375 mV
Lead Channel Sensing Intrinsic Amplitude: 5 mV
Lead Channel Sensing Intrinsic Amplitude: 5 mV
Lead Channel Setting Pacing Amplitude: 2 V
Lead Channel Setting Pacing Amplitude: 2.5 V
Lead Channel Setting Pacing Pulse Width: 0.4 ms
Lead Channel Setting Sensing Sensitivity: 1.2 mV
Zone Setting Status: 755011

## 2024-09-15 ENCOUNTER — Ambulatory Visit: Payer: Self-pay | Admitting: Cardiology

## 2024-09-17 NOTE — Progress Notes (Signed)
 Remote PPM Transmission

## 2024-10-01 ENCOUNTER — Telehealth: Payer: Self-pay | Admitting: Cardiology

## 2024-10-01 NOTE — Telephone Encounter (Signed)
"  ° °  Pre-operative Risk Assessment    Patient Name: Deborah Obrien  DOB: Feb 23, 1941 MRN: [id number]   Date of last office visit: 06/20/24 Date of next office visit: n/a   Request for Surgical Clearance    Procedure:  release right ring trigger finger  Date of Surgery:  Clearance TBD                                Surgeon:  Dr Reyes Maltos Surgeon's Group or Practice Name:  [facility] Phone number:  [phone] Fax number:  [phone]   Type of Clearance Requested:   - Medical    Type of Anesthesia:  Not Indicated   Additional requests/questions:    Bonney Rosina Stamps   10/01/2024, 3:43 PM   "

## 2024-10-07 NOTE — Telephone Encounter (Signed)
 Patient with diagnosis of atrial fibrillation on Eliquis  for anticoagulation.    Procedure:  release right ring trigger finger   Date of Surgery:  Clearance TBD     CHA2DS2-VASc Score = 9   This indicates a 12.2% annual risk of stroke. The patient's score is based upon: CHF History: 1 HTN History: 1 Diabetes History: 1 Stroke History: 2 Vascular Disease History: 1 Age Score: 2 Gender Score: 1   Chart indicates history of stroke back in 2018  CrCl 36 Platelet count 168  Patient has not had an Afib/aflutter ablation in the last 3 months, DCCV within the last 4 weeks or a watchman implanted in the last 45 days   Will need input from primary cardiologist on whether to bridge,  2/2 elevate CHADS2-VASc score.  Would suggest 2 day hold because of lower CrCl.  Has been followed by Suzann Riddle NP since 2024, will forward to her and Ddr.   **This guidance is not considered finalized until pre-operative APP has relayed final recommendations.**

## 2024-10-08 NOTE — Telephone Encounter (Signed)
" ° °  Name: Deborah Obrien  DOB: [birth year]  MRN: [id number]  Primary Cardiologist: None   Preoperative team, please contact this patient and set up a phone call appointment for further preoperative risk assessment. Please obtain consent and complete medication review. Thank you for your help.  I confirm that guidance regarding antiplatelet and oral anticoagulation therapy has been completed and, if necessary, noted below. - Pending decision from cardiologist.  I also confirmed the patient resides in the state of [place] . As per [facility] Medical Board telemedicine laws, the patient must reside in the state in which the provider is licensed.   Glendia Ferrier, PA-C 10/08/2024, 5:01 PM [facility]    "

## 2024-10-09 ENCOUNTER — Telehealth: Payer: Self-pay

## 2024-10-09 NOTE — Telephone Encounter (Signed)
"  °  Patient Consent for Virtual Visit         Deborah Obrien has provided verbal consent on 10/09/2024 for a virtual visit (video or telephone).  Appointment is scheduled for 10/11/24 at 2:40pm. Med req and consent are complete. Call patient at [phone].    CONSENT FOR VIRTUAL VISIT FOR:  Deborah Obrien  By participating in this virtual visit I agree to the following:  I hereby voluntarily request, consent and authorize [facility] and its employed or contracted physicians, physician assistants, nurse practitioners or other licensed health care professionals (the Practitioner), to provide me with telemedicine health care services (the Services) as deemed necessary by the treating Practitioner. I acknowledge and consent to receive the Services by the Practitioner via telemedicine. I understand that the telemedicine visit will involve communicating with the Practitioner through live audiovisual communication technology and the disclosure of certain medical information by electronic transmission. I acknowledge that I have been given the opportunity to request an in-person assessment or other available alternative prior to the telemedicine visit and am voluntarily participating in the telemedicine visit.  I understand that I have the right to withhold or withdraw my consent to the use of telemedicine in the course of my care at any time, without affecting my right to future care or treatment, and that the Practitioner or I may terminate the telemedicine visit at any time. I understand that I have the right to inspect all information obtained and/or recorded in the course of the telemedicine visit and may receive copies of available information for a reasonable fee.  I understand that some of the potential risks of receiving the Services via telemedicine include:  Delay or interruption in medical evaluation due to technological equipment failure or disruption; Information transmitted  may not be sufficient (e.g. poor resolution of images) to allow for appropriate medical decision making by the Practitioner; and/or  In rare instances, security protocols could fail, causing a breach of personal health information.  Furthermore, I acknowledge that it is my responsibility to provide information about my medical history, conditions and care that is complete and accurate to the best of my ability. I acknowledge that Practitioner's advice, recommendations, and/or decision may be based on factors not within their control, such as incomplete or inaccurate data provided by me or distortions of diagnostic images or specimens that may result from electronic transmissions. I understand that the practice of medicine is not an exact science and that Practitioner makes no warranties or guarantees regarding treatment outcomes. I acknowledge that a copy of this consent can be made available to me via my patient portal [facility] MyChart), or I can request a printed copy by calling the office of [facility].    I understand that my insurance will be billed for this visit.   I have read or had this consent read to me. I understand the contents of this consent, which adequately explains the benefits and risks of the Services being provided via telemedicine.  I have been provided ample opportunity to ask questions regarding this consent and the Services and have had my questions answered to my satisfaction. I give my informed consent for the services to be provided through the use of telemedicine in my medical care    "

## 2024-10-09 NOTE — Telephone Encounter (Signed)
 I just s/w the pt's son who said someone just called from our office. I apologized and see that tele appt 10/11/24 was made.

## 2024-10-09 NOTE — Telephone Encounter (Signed)
 Appointment is scheduled for 10/11/24 at 2:40pm. Med req and consent are complete. Call patient at [phone].

## 2024-10-09 NOTE — Telephone Encounter (Signed)
 I sent a secure chat to Dorise Pereyra, FNP with [facility] and inquired type of anesthesia being used. Per Dorise Pereyra, FNP: I would think regional + mac for that.

## 2024-10-11 ENCOUNTER — Ambulatory Visit (INDEPENDENT_AMBULATORY_CARE_PROVIDER_SITE_OTHER)

## 2024-10-11 ENCOUNTER — Ambulatory Visit: Attending: Emergency Medicine

## 2024-10-11 DIAGNOSIS — Z01810 Encounter for preprocedural cardiovascular examination: Secondary | ICD-10-CM | POA: Diagnosis not present

## 2024-10-11 NOTE — Progress Notes (Signed)
 Patient will need to be scheduled for an in office visit so Dr. Kennyth may evaluate the patient and safely provide [facility] bridging recommendations.

## 2024-10-11 NOTE — Telephone Encounter (Signed)
 Update: Dr. Kennyth has reviewed the patient's chart, and has stated:  Just had a chance to look through her chart now that I am done with my morning clinic. Again, I have never seen this patient but I see no reason she would need bridging. Would proceed with usual preOp protocol. No need to see me in clinic. Thanks.  We will add the patient back onto our schedule for this afternoon as originally planned.

## 2024-10-11 NOTE — Telephone Encounter (Signed)
 I spoke with Dr. Kennyth to clarify [facility] bridging recommendations.  Dr. Kennyth stated he had not seen the patient.  Will cancel today's preoperative phone visit to schedule the patient for an in person visit with Dr. Kennyth, where he may provide preoperative clearance recommendations as well as [facility] bridging recommendations at that time.  Preop callback team, please cancel this patient's phone visit today and schedule them with an in office visit with Dr. Kennyth at the earliest convenience.  Please place preop in the appointment notes.

## 2025-03-10 ENCOUNTER — Encounter

## 2025-06-09 ENCOUNTER — Encounter

## 2025-09-08 ENCOUNTER — Encounter

## 2025-12-08 ENCOUNTER — Encounter
